# Patient Record
Sex: Female | Born: 1935 | Race: White | Hispanic: No | Marital: Married | Smoking: Never smoker
Health system: Southern US, Community
[De-identification: ages and names within clinical notes are randomized; demographics above are authoritative.]

## PROBLEM LIST (undated history)

## (undated) DIAGNOSIS — N8111 Cystocele, midline: Secondary | ICD-10-CM

## (undated) DIAGNOSIS — E876 Hypokalemia: Secondary | ICD-10-CM

## (undated) DIAGNOSIS — J189 Pneumonia, unspecified organism: Secondary | ICD-10-CM

## (undated) DIAGNOSIS — N3090 Cystitis, unspecified without hematuria: Secondary | ICD-10-CM

## (undated) DIAGNOSIS — R0602 Shortness of breath: Secondary | ICD-10-CM

## (undated) DIAGNOSIS — K219 Gastro-esophageal reflux disease without esophagitis: Secondary | ICD-10-CM

## (undated) DIAGNOSIS — K449 Diaphragmatic hernia without obstruction or gangrene: Secondary | ICD-10-CM

## (undated) DIAGNOSIS — IMO0002 Reserved for concepts with insufficient information to code with codable children: Secondary | ICD-10-CM

## (undated) DIAGNOSIS — I10 Essential (primary) hypertension: Secondary | ICD-10-CM

## (undated) DIAGNOSIS — M1990 Unspecified osteoarthritis, unspecified site: Secondary | ICD-10-CM

## (undated) DIAGNOSIS — C50919 Malignant neoplasm of unspecified site of unspecified female breast: Secondary | ICD-10-CM

## (undated) DIAGNOSIS — M76899 Other specified enthesopathies of unspecified lower limb, excluding foot: Secondary | ICD-10-CM

## (undated) DIAGNOSIS — G8918 Other acute postprocedural pain: Secondary | ICD-10-CM

## (undated) DIAGNOSIS — M545 Low back pain: Secondary | ICD-10-CM

## (undated) DIAGNOSIS — J329 Chronic sinusitis, unspecified: Secondary | ICD-10-CM

## (undated) DIAGNOSIS — E871 Hypo-osmolality and hyponatremia: Secondary | ICD-10-CM

## (undated) DIAGNOSIS — M549 Dorsalgia, unspecified: Secondary | ICD-10-CM

## (undated) DIAGNOSIS — L989 Disorder of the skin and subcutaneous tissue, unspecified: Secondary | ICD-10-CM

## (undated) DIAGNOSIS — M79603 Pain in arm, unspecified: Secondary | ICD-10-CM

## (undated) DIAGNOSIS — D649 Anemia, unspecified: Secondary | ICD-10-CM

## (undated) DIAGNOSIS — G20A1 Parkinson's disease without dyskinesia, without mention of fluctuations: Secondary | ICD-10-CM

## (undated) DIAGNOSIS — N3020 Other chronic cystitis without hematuria: Secondary | ICD-10-CM

## (undated) DIAGNOSIS — R079 Chest pain, unspecified: Secondary | ICD-10-CM

## (undated) DIAGNOSIS — M5137 Other intervertebral disc degeneration, lumbosacral region: Secondary | ICD-10-CM

## (undated) DIAGNOSIS — M542 Cervicalgia: Secondary | ICD-10-CM

## (undated) DIAGNOSIS — G20 Parkinson's disease: Secondary | ICD-10-CM

## (undated) HISTORY — DX: Low back pain: M54.5

## (undated) HISTORY — DX: Diaphragmatic hernia without obstruction or gangrene: K44.9

## (undated) HISTORY — DX: Parkinson's disease without dyskinesia, without mention of fluctuations: G20.A1

## (undated) HISTORY — DX: Essential (primary) hypertension: I10

## (undated) HISTORY — DX: Other acute postprocedural pain: G89.18

## (undated) HISTORY — DX: Parkinson's disease: G20

## (undated) HISTORY — DX: Unspecified osteoarthritis, unspecified site: M19.90

## (undated) HISTORY — DX: Chronic sinusitis, unspecified: J32.9

## (undated) HISTORY — DX: Hypokalemia: E87.6

## (undated) HISTORY — DX: Chest pain, unspecified: R07.9

## (undated) HISTORY — DX: Shortness of breath: R06.02

## (undated) HISTORY — DX: Anemia, unspecified: D64.9

## (undated) HISTORY — DX: Cystitis, unspecified without hematuria: N30.90

## (undated) HISTORY — DX: Reserved for concepts with insufficient information to code with codable children: IMO0002

## (undated) HISTORY — DX: Dorsalgia, unspecified: M54.9

## (undated) HISTORY — PX: BREAST IMPLANT REMOVAL: SUR1101

## (undated) HISTORY — DX: Other intervertebral disc degeneration, lumbosacral region: M51.37

## (undated) HISTORY — DX: Other chronic cystitis without hematuria: N30.20

## (undated) HISTORY — DX: Cystocele, midline: N81.11

## (undated) HISTORY — PX: ABDOMINAL HYSTERECTOMY: SHX81

## (undated) HISTORY — DX: Cervicalgia: M54.2

## (undated) HISTORY — DX: Hypo-osmolality and hyponatremia: E87.1

## (undated) HISTORY — DX: Pain in arm, unspecified: M79.603

## (undated) HISTORY — DX: Disorder of the skin and subcutaneous tissue, unspecified: L98.9

## (undated) HISTORY — DX: Malignant neoplasm of unspecified site of unspecified female breast: C50.919

## (undated) HISTORY — DX: Other specified enthesopathies of unspecified lower limb, excluding foot: M76.899

## (undated) HISTORY — PX: BLEPHAROPLASTY: SUR158

## (undated) HISTORY — DX: Pneumonia, unspecified organism: J18.9

## (undated) HISTORY — DX: Gastro-esophageal reflux disease without esophagitis: K21.9

---

## 1977-12-18 HISTORY — PX: TONSILLECTOMY AND ADENOIDECTOMY: SHX28

## 1981-12-18 HISTORY — PX: VESICOVAGINAL FISTULA CLOSURE W/ TAH: SUR271

## 1984-12-18 HISTORY — PX: BREAST SURGERY: SHX581

## 1985-12-18 HISTORY — PX: BREAST ENHANCEMENT SURGERY: SHX7

## 2004-10-20 ENCOUNTER — Ambulatory Visit: Payer: Self-pay | Admitting: Internal Medicine

## 2005-06-12 ENCOUNTER — Emergency Department: Payer: Self-pay | Admitting: Emergency Medicine

## 2005-06-12 ENCOUNTER — Other Ambulatory Visit: Payer: Self-pay

## 2005-08-25 ENCOUNTER — Ambulatory Visit: Payer: Self-pay | Admitting: Unknown Physician Specialty

## 2005-11-01 ENCOUNTER — Ambulatory Visit: Payer: Self-pay | Admitting: Internal Medicine

## 2006-10-24 ENCOUNTER — Ambulatory Visit: Payer: Self-pay | Admitting: Internal Medicine

## 2007-01-10 ENCOUNTER — Ambulatory Visit: Payer: Self-pay | Admitting: Urology

## 2007-06-17 ENCOUNTER — Ambulatory Visit: Payer: Self-pay | Admitting: Surgery

## 2007-07-16 ENCOUNTER — Ambulatory Visit: Payer: Self-pay | Admitting: Internal Medicine

## 2007-09-17 ENCOUNTER — Ambulatory Visit: Payer: Self-pay | Admitting: Unknown Physician Specialty

## 2007-11-11 ENCOUNTER — Ambulatory Visit: Payer: Self-pay | Admitting: Internal Medicine

## 2008-12-03 ENCOUNTER — Ambulatory Visit: Payer: Self-pay | Admitting: Internal Medicine

## 2009-03-31 ENCOUNTER — Ambulatory Visit: Payer: Self-pay | Admitting: Unknown Physician Specialty

## 2009-05-05 ENCOUNTER — Ambulatory Visit: Payer: Self-pay | Admitting: Pain Medicine

## 2009-05-11 ENCOUNTER — Ambulatory Visit: Payer: Self-pay | Admitting: Pain Medicine

## 2009-05-26 ENCOUNTER — Ambulatory Visit: Payer: Self-pay | Admitting: Physician Assistant

## 2009-07-19 ENCOUNTER — Ambulatory Visit: Payer: Self-pay | Admitting: Internal Medicine

## 2009-07-23 ENCOUNTER — Ambulatory Visit: Payer: Self-pay | Admitting: Internal Medicine

## 2009-10-05 ENCOUNTER — Ambulatory Visit: Payer: Self-pay | Admitting: Pain Medicine

## 2009-10-20 ENCOUNTER — Ambulatory Visit: Payer: Self-pay | Admitting: Physician Assistant

## 2009-10-27 ENCOUNTER — Ambulatory Visit: Payer: Self-pay | Admitting: Internal Medicine

## 2009-12-03 ENCOUNTER — Ambulatory Visit: Payer: Self-pay | Admitting: Urology

## 2010-04-13 ENCOUNTER — Encounter: Payer: Self-pay | Admitting: Cardiovascular Disease

## 2010-04-29 ENCOUNTER — Ambulatory Visit: Payer: Self-pay | Admitting: Cardiovascular Disease

## 2010-04-29 DIAGNOSIS — R0602 Shortness of breath: Secondary | ICD-10-CM

## 2010-04-29 DIAGNOSIS — R079 Chest pain, unspecified: Secondary | ICD-10-CM

## 2010-04-29 DIAGNOSIS — E7800 Pure hypercholesterolemia, unspecified: Secondary | ICD-10-CM

## 2010-04-29 DIAGNOSIS — I10 Essential (primary) hypertension: Secondary | ICD-10-CM | POA: Insufficient documentation

## 2010-04-29 HISTORY — DX: Chest pain, unspecified: R07.9

## 2010-04-29 HISTORY — DX: Shortness of breath: R06.02

## 2010-05-04 ENCOUNTER — Telehealth (INDEPENDENT_AMBULATORY_CARE_PROVIDER_SITE_OTHER): Payer: Self-pay | Admitting: *Deleted

## 2010-05-05 ENCOUNTER — Encounter (HOSPITAL_COMMUNITY): Admission: RE | Admit: 2010-05-05 | Discharge: 2010-07-19 | Payer: Self-pay | Admitting: Cardiovascular Disease

## 2010-05-05 ENCOUNTER — Ambulatory Visit: Payer: Self-pay | Admitting: Internal Medicine

## 2010-05-05 ENCOUNTER — Ambulatory Visit: Payer: Self-pay

## 2010-06-02 ENCOUNTER — Ambulatory Visit: Payer: Self-pay | Admitting: Obstetrics and Gynecology

## 2011-01-17 NOTE — Letter (Signed)
Summary: PHI  PHI   Imported By: Harlon Flor 05/02/2010 16:51:01  _____________________________________________________________________  External Attachment:    Type:   Image     Comment:   External Document

## 2011-01-17 NOTE — Progress Notes (Signed)
Summary: Nuclear Pre-Procedure  Phone Note Outgoing Call   Call placed by: Milana Na, EMT-P,  May 04, 2010 4:20 PM Summary of Call: Reviewed information on Myoview Information Sheet (see scanned document for further details).  Spoke with patient.     Nuclear Med Background Indications for Stress Test: Evaluation for Ischemia     Symptoms: Chest Pain with Exertion, DOE    Nuclear Pre-Procedure Cardiac Risk Factors: Hypertension, Lipids Height (in): 65  Nuclear Med Study Referring MD:  T.Gollan

## 2011-01-17 NOTE — Assessment & Plan Note (Signed)
Summary: Cardiology Nuclear Study  Nuclear Med Background Indications for Stress Test: Evaluation for Ischemia    History Comments: NO DOCUMENTED CAD  Symptoms: Chest Tightness, Chest Tightness with Exertion, DOE, Fatigue, Palpitations, Rapid HR  Symptoms Comments: CP>back. Last episode of GE:XBMW week   Nuclear Pre-Procedure Cardiac Risk Factors: Hypertension, Lipids, Obesity Caffeine/Decaff Intake: None NPO After: 7:00 PM Lungs: Clear IV 0.9% NS with Angio Cath: 22g     IV Site: (r) WRIST IV Started by: Stanton Kidney EMT-P Chest Size (in) 38     Cup Size d     Height (in): 64 Weight (lb): 188 BMI: 32.39 Tech Comments: Metoprolol Held > 24 hours, per patient.  Nuclear Med Study 1 or 2 day study:  1 day     Stress Test Type:  Stress Reading MD:  Arvilla Meres, MD     Referring MD:  Julien Nordmann, MD Resting Radionuclide:  Technetium 21m Tetrofosmin     Resting Radionuclide Dose:  11.0 mCi  Stress Radionuclide:  Technetium 87m Tetrofosmin     Stress Radionuclide Dose:  33.0 mCi   Stress Protocol Exercise Time (min):  5:00 min     Max HR:  151 bpm     Predicted Max HR:  148 bpm  Max Systolic BP: 228 mm Hg     Percent Max HR:  102.03 %     METS: 7.0 Rate Pressure Product:  41324    Stress Test Technologist:  Rea College CMA-N     Nuclear Technologist:  Domenic Polite CNMT  Rest Procedure  Myocardial perfusion imaging was performed at rest 45 minutes following the intravenous administration of Myoview Technetium 41m Tetrofosmin.  Stress Procedure  The patient exercised for five minutes.  The patient stopped due to fatigue and denied any chest pain.  There were no diagnostic ST-T wave changes, only occasional PAC's.  She did have a hypertensive response to exercise, 228/64, immediately post exercise.  Myoview was injected at peak exercise and myocardial perfusion imaging was performed after a brief delay.  QPS Raw Data Images:  Normal; no motion artifact; normal  heart/lung ratio. Stress Images:  There is normal uptake in all areas. Rest Images:  Normal homogeneous uptake in all areas of the myocardium. Subtraction (SDS):  Normal Transient Ischemic Dilatation:  .86  (Normal <1.22)  Lung/Heart Ratio:  .29  (Normal <0.45)  Quantitative Gated Spect Images QGS EDV:  54 ml QGS ESV:  11 ml QGS EF:  80 % QGS cine images:  Normal  Findings Normal nuclear study      Overall Impression  Exercise Capacity: Fair exercise capacity. BP Response: Hypertensive blood pressure response. Clinical Symptoms: + dyspnea. ECG Impression: Insignificant upsloping ST segment depression. Overall Impression: Normal stress nuclear study.    Appended Document: Cardiology Nuclear Study no ischemia. BP needs to be watched.  Need to walk more as exercise tolerance was low  Appended Document: Cardiology Nuclear Study Called spoke with pt, aware of results. Advised pt to monitor BP's and call if consistantly elevated and encouraged in walking for exercise.  EWJ

## 2011-01-17 NOTE — Assessment & Plan Note (Signed)
Summary: NP6/AMD   Primary Provider:  Dale [place], MD  CC:  Consult;DOE;Chest Pressure.  History of Present Illness: Ms. Grealish is a 76 year old woman with a history of renal insufficiency, patient of Dr. Achilles Dunk, who states that this week she has had significant shortness of breath, lightheadedness, chest pain, back pain particularly when she works in the garden. When she stops for 5 minutes she feels better and then she is able to start working again until she developed symptoms again. She has shortness of breath particularly with exertion which he states is new for her.  She has a history of breast cancer many years ago though had no chemotherapy or radiation.   Prior to these episodes, she had been feeling well, working in her garden without any problems. She has been taking aspirin on an occasional basis. She does take her cholesterol medication daily. She states that her blood pressures typically well-controlled.  Problems Prior to Update: 1)  Hypertension  (ICD-401.9) 2)  Chest Pain  (ICD-786.50) 3)  Dyspnea  (ICD-786.05)  Medications Prior to Update: 1)  Evista 60 Mg Tabs (Raloxifene Hcl) .... Take 1 Tablet By Mouth Once Daily 2)  Fluoxetine Hcl 10 Mg Caps (Fluoxetine Hcl) .... Take 1 Tablet By Mouth Once Daily 3)  Lisinopril 40 Mg Tabs (Lisinopril) .... Take 1 Tablet By Mouth Once Daily 4)  Hydrochlorothiazide 25 Mg Tabs (Hydrochlorothiazide) .... Take 1 Tablet By Mouth Once Daily 5)  Celebrex 200 Mg Caps (Celecoxib) .... Take 1 Tablet By Mouth Once Daily 6)  Simvastatin 10 Mg Tabs (Simvastatin) .... Take 1 Tablet By Mouth Once Daily 7)  Metoprolol Succinate 25 Mg Xr24h-Tab (Metoprolol Succinate) .... Take 1 Tablet By Mouth  Two Times A Day 8)  Aspirin 81 Mg Tbec (Aspirin) .... Take One Tablet By Mouth Daily  Current Medications (verified): 1)  Evista 60 Mg Tabs (Raloxifene Hcl) .... Take 1 Tablet By Mouth Once Daily 2)  Fluoxetine Hcl 10 Mg Caps (Fluoxetine Hcl) .... Take 1  Tablet By Mouth Once Daily 3)  Lisinopril 40 Mg Tabs (Lisinopril) .... Take 1 Tablet By Mouth Once Daily 4)  Hydrochlorothiazide 25 Mg Tabs (Hydrochlorothiazide) .... Take 1 Tablet By Mouth Once Daily 5)  Meloxicam 7.5 Mg Tabs (Meloxicam) .... Take 1 By Mouth Once Daily 6)  Simvastatin 10 Mg Tabs (Simvastatin) .... Take 1 Tablet By Mouth Once Daily 7)  Metoprolol Succinate 25 Mg Xr24h-Tab (Metoprolol Succinate) .... Take 1 Tablet By Mouth  Two Times A Day 8)  Aspirin 81 Mg Tbec (Aspirin) .... Take One Tablet By Mouth As Needed 9)  Omeprazole 20 Mg Cpdr (Omeprazole) .... Take 1 By Mouth Once Daily 10)  Trazodone Hcl 50 Mg Tabs (Trazodone Hcl) .... Take Half By Mouth At Bedtime 11)  Hydrocodone-Acetaminophen 5-325 Mg Tabs (Hydrocodone-Acetaminophen) .... Take 1/2 To 1 By Mouth Once Daily 12)  Calcium 600 1500 Mg Tabs (Calcium Carbonate) .... Take 1 By Mouth Once Daily 13)  Glucosamine-Chondroitin  Caps (Glucosamine-Chondroit-Vit C-Mn) .... Take 2 By Mouth Once Daily 14)  Daily Multi  Tabs (Multiple Vitamins-Minerals) .... Take 1 By Mouth Once Daily  Allergies (verified): No Known Drug Allergies  Past History:  Past Medical History: Last updated: 04/13/2010 Breast Cancer-Mastectomy-left in 1986 Gastroesophageal Reflux Hiatal Hernia Hypertension Hysterectomy Chronic Cystitis Mixed Urinary Incontinence  Family History: Last updated: 04/13/2010 Family History of Depression:  Parkinson's Disease  Social History: Last updated: 04/13/2010 Tobacco Use - No.  Alcohol Use - no Drug Use - no  Risk Factors:  Alcohol Use: 0 (04/13/2010) Caffeine Use: Yes (04/13/2010)  Risk Factors: Smoking Status: never (04/13/2010)  Review of Systems  The patient denies fever, weight loss, weight gain, vision loss, decreased hearing, hoarseness, chest pain, syncope, dyspnea on exertion, peripheral edema, prolonged cough, abdominal pain, incontinence, muscle weakness, depression, and enlarged lymph  nodes.    Vital Signs:  Patient profile:   76 year old female Height:      65 inches Weight:      186 pounds BMI:     31.06 Pulse rate:   68 / minute BP sitting:   130 / 88  (left arm) Cuff size:   regular  Vitals Entered By: Stanton Kidney, EMT-P (Apr 29, 2010 2:23 PM)  Physical Exam  General:  elderly woman in no apparent distress, HENT exam is benign, or branch is clear, neck is supple with no JVP or carotid bruits, heart sounds are regular with S1-S2 and no murmurs appreciated, lungs are clear to auscultation with no wheezes or rales, abdominal exam is benign, no significant lower extremity edema, neurologic exam is grossly nonfocal, skin is warm and dry. Pulses are equal and symmetrical in her upper and lower extremities   New Orders:     1)  Nuclear Stress Test (Nuc Stress Test)  Due: 04/29/2010   EKG  Procedure date:  04/29/2010  Findings:      EKG shows normal sinus rhythm with rate 68 beats per minute, no significant ST or T wave changes.  Impression & Recommendations:  Problem # 1:  DYSPNEA (ICD-786.05) etiology of her shortness of breath and chest pain associated with exertion is concerning for ischemia. She is uncertain of her family history. She does have hyperlipidemia, hypertension. No smoking history. Because she has several risk factors, we ordered a stress test for her. We've asked her to take her aspirin on a consistent basis daily.  Her updated medication list for this problem includes:    Lisinopril 40 Mg Tabs (Lisinopril) .Marland Kitchen... Take 1 tablet by mouth once daily    Hydrochlorothiazide 25 Mg Tabs (Hydrochlorothiazide) .Marland Kitchen... Take 1 tablet by mouth once daily    Metoprolol Succinate 25 Mg Xr24h-tab (Metoprolol succinate) .Marland Kitchen... Take 1 tablet by mouth  two times a day    Aspirin 81 Mg Tbec (Aspirin) .Marland Kitchen... Take one tablet by mouth as needed  Orders: Nuclear Stress Test (Nuc Stress Test)  Problem # 2:  HYPERTENSION (ICD-401.9) Blood pressure was reasonably  well controlled on her visit today we've made no changes.  Her updated medication list for this problem includes:    Lisinopril 40 Mg Tabs (Lisinopril) .Marland Kitchen... Take 1 tablet by mouth once daily    Hydrochlorothiazide 25 Mg Tabs (Hydrochlorothiazide) .Marland Kitchen... Take 1 tablet by mouth once daily    Metoprolol Succinate 25 Mg Xr24h-tab (Metoprolol succinate) .Marland Kitchen... Take 1 tablet by mouth  two times a day    Aspirin 81 Mg Tbec (Aspirin) .Marland Kitchen... Take one tablet by mouth as needed  Orders: Nuclear Stress Test (Nuc Stress Test)  Problem # 3:  HYPERLIPIDEMIA-MIXED (ICD-272.4) We'll try to obtain her most recent lipid panel from Dr. Dale [place].  Her updated medication list for this problem includes:    Simvastatin 10 Mg Tabs (Simvastatin) .Marland Kitchen... Take 1 tablet by mouth once daily  Patient Instructions: 1)  Your physician recommends that you schedule a follow-up appointment as needed  2)  Your physician has requested that you have an exercise stress myoview.  For further information please visit [email or web address]  Please  follow instruction sheet, as given.

## 2011-06-05 ENCOUNTER — Ambulatory Visit: Payer: Self-pay | Admitting: Internal Medicine

## 2011-07-11 ENCOUNTER — Encounter: Payer: Self-pay | Admitting: Cardiovascular Disease

## 2011-10-19 ENCOUNTER — Ambulatory Visit: Payer: Self-pay | Admitting: Internal Medicine

## 2011-12-19 HISTORY — PX: BREAST BIOPSY: SHX20

## 2012-01-21 DIAGNOSIS — N760 Acute vaginitis: Secondary | ICD-10-CM | POA: Diagnosis not present

## 2012-02-15 DIAGNOSIS — IMO0002 Reserved for concepts with insufficient information to code with codable children: Secondary | ICD-10-CM | POA: Diagnosis not present

## 2012-02-15 DIAGNOSIS — M5430 Sciatica, unspecified side: Secondary | ICD-10-CM | POA: Diagnosis not present

## 2012-02-20 ENCOUNTER — Ambulatory Visit: Payer: Self-pay | Admitting: Unknown Physician Specialty

## 2012-02-20 DIAGNOSIS — I10 Essential (primary) hypertension: Secondary | ICD-10-CM | POA: Diagnosis not present

## 2012-02-20 DIAGNOSIS — Z9079 Acquired absence of other genital organ(s): Secondary | ICD-10-CM | POA: Diagnosis not present

## 2012-02-20 DIAGNOSIS — R1031 Right lower quadrant pain: Secondary | ICD-10-CM | POA: Diagnosis not present

## 2012-02-20 DIAGNOSIS — Z8371 Family history of colonic polyps: Secondary | ICD-10-CM | POA: Diagnosis not present

## 2012-02-20 DIAGNOSIS — Z9010 Acquired absence of unspecified breast and nipple: Secondary | ICD-10-CM | POA: Diagnosis not present

## 2012-02-20 DIAGNOSIS — K5730 Diverticulosis of large intestine without perforation or abscess without bleeding: Secondary | ICD-10-CM | POA: Diagnosis not present

## 2012-02-20 DIAGNOSIS — Z79899 Other long term (current) drug therapy: Secondary | ICD-10-CM | POA: Diagnosis not present

## 2012-02-20 DIAGNOSIS — M5137 Other intervertebral disc degeneration, lumbosacral region: Secondary | ICD-10-CM | POA: Diagnosis not present

## 2012-02-20 DIAGNOSIS — E785 Hyperlipidemia, unspecified: Secondary | ICD-10-CM | POA: Diagnosis not present

## 2012-02-20 DIAGNOSIS — Z9889 Other specified postprocedural states: Secondary | ICD-10-CM | POA: Diagnosis not present

## 2012-02-20 DIAGNOSIS — R1084 Generalized abdominal pain: Secondary | ICD-10-CM | POA: Diagnosis not present

## 2012-02-20 DIAGNOSIS — K648 Other hemorrhoids: Secondary | ICD-10-CM | POA: Diagnosis not present

## 2012-02-20 DIAGNOSIS — IMO0002 Reserved for concepts with insufficient information to code with codable children: Secondary | ICD-10-CM | POA: Diagnosis not present

## 2012-02-20 DIAGNOSIS — K649 Unspecified hemorrhoids: Secondary | ICD-10-CM | POA: Diagnosis not present

## 2012-02-20 LAB — BASIC METABOLIC PANEL
Anion Gap: 9 (ref 7–16)
Calcium, Total: 9.2 mg/dL (ref 8.5–10.1)
Co2: 29 mmol/L (ref 21–32)
Creatinine: 0.76 mg/dL (ref 0.60–1.30)
EGFR (African American): >60
EGFR (Non-African Amer.): >60
Osmolality: 272 (ref 275–301)
Potassium: 3.9 mmol/L (ref 3.5–5.1)
Sodium: 137 mmol/L (ref 136–145)

## 2012-02-23 ENCOUNTER — Ambulatory Visit: Payer: Self-pay | Admitting: Unknown Physician Specialty

## 2012-02-23 DIAGNOSIS — K6389 Other specified diseases of intestine: Secondary | ICD-10-CM | POA: Diagnosis not present

## 2012-02-23 DIAGNOSIS — R1011 Right upper quadrant pain: Secondary | ICD-10-CM | POA: Diagnosis not present

## 2012-02-23 DIAGNOSIS — K5730 Diverticulosis of large intestine without perforation or abscess without bleeding: Secondary | ICD-10-CM | POA: Diagnosis not present

## 2012-02-27 DIAGNOSIS — M5137 Other intervertebral disc degeneration, lumbosacral region: Secondary | ICD-10-CM | POA: Diagnosis not present

## 2012-02-27 DIAGNOSIS — IMO0002 Reserved for concepts with insufficient information to code with codable children: Secondary | ICD-10-CM | POA: Diagnosis not present

## 2012-03-01 DIAGNOSIS — R1084 Generalized abdominal pain: Secondary | ICD-10-CM | POA: Diagnosis not present

## 2012-03-01 DIAGNOSIS — E7800 Pure hypercholesterolemia, unspecified: Secondary | ICD-10-CM | POA: Diagnosis not present

## 2012-03-01 DIAGNOSIS — N760 Acute vaginitis: Secondary | ICD-10-CM | POA: Diagnosis not present

## 2012-03-01 DIAGNOSIS — I10 Essential (primary) hypertension: Secondary | ICD-10-CM | POA: Diagnosis not present

## 2012-03-01 DIAGNOSIS — N390 Urinary tract infection, site not specified: Secondary | ICD-10-CM | POA: Diagnosis not present

## 2012-03-13 DIAGNOSIS — M4800 Spinal stenosis, site unspecified: Secondary | ICD-10-CM | POA: Diagnosis not present

## 2012-03-13 DIAGNOSIS — M549 Dorsalgia, unspecified: Secondary | ICD-10-CM | POA: Diagnosis not present

## 2012-03-13 DIAGNOSIS — IMO0002 Reserved for concepts with insufficient information to code with codable children: Secondary | ICD-10-CM | POA: Diagnosis not present

## 2012-03-20 ENCOUNTER — Ambulatory Visit: Payer: Self-pay | Admitting: Unknown Physician Specialty

## 2012-03-20 DIAGNOSIS — M5137 Other intervertebral disc degeneration, lumbosacral region: Secondary | ICD-10-CM | POA: Diagnosis not present

## 2012-03-20 DIAGNOSIS — M5126 Other intervertebral disc displacement, lumbar region: Secondary | ICD-10-CM | POA: Diagnosis not present

## 2012-03-20 DIAGNOSIS — M47817 Spondylosis without myelopathy or radiculopathy, lumbosacral region: Secondary | ICD-10-CM | POA: Diagnosis not present

## 2012-04-29 DIAGNOSIS — L821 Other seborrheic keratosis: Secondary | ICD-10-CM | POA: Diagnosis not present

## 2012-04-29 DIAGNOSIS — D2370 Other benign neoplasm of skin of unspecified lower limb, including hip: Secondary | ICD-10-CM | POA: Diagnosis not present

## 2012-04-29 DIAGNOSIS — L819 Disorder of pigmentation, unspecified: Secondary | ICD-10-CM | POA: Diagnosis not present

## 2012-04-29 DIAGNOSIS — Z0189 Encounter for other specified special examinations: Secondary | ICD-10-CM | POA: Diagnosis not present

## 2012-05-21 ENCOUNTER — Ambulatory Visit: Payer: Self-pay | Admitting: Pain Medicine

## 2012-05-21 DIAGNOSIS — Z79899 Other long term (current) drug therapy: Secondary | ICD-10-CM | POA: Diagnosis not present

## 2012-05-21 DIAGNOSIS — G8929 Other chronic pain: Secondary | ICD-10-CM | POA: Diagnosis not present

## 2012-05-21 DIAGNOSIS — Z853 Personal history of malignant neoplasm of breast: Secondary | ICD-10-CM | POA: Diagnosis not present

## 2012-05-21 DIAGNOSIS — K449 Diaphragmatic hernia without obstruction or gangrene: Secondary | ICD-10-CM | POA: Diagnosis not present

## 2012-05-21 DIAGNOSIS — M1990 Unspecified osteoarthritis, unspecified site: Secondary | ICD-10-CM | POA: Diagnosis not present

## 2012-05-21 DIAGNOSIS — M461 Sacroiliitis, not elsewhere classified: Secondary | ICD-10-CM | POA: Diagnosis not present

## 2012-05-21 DIAGNOSIS — M533 Sacrococcygeal disorders, not elsewhere classified: Secondary | ICD-10-CM | POA: Diagnosis not present

## 2012-05-21 DIAGNOSIS — F329 Major depressive disorder, single episode, unspecified: Secondary | ICD-10-CM | POA: Diagnosis not present

## 2012-05-21 DIAGNOSIS — M4716 Other spondylosis with myelopathy, lumbar region: Secondary | ICD-10-CM | POA: Diagnosis not present

## 2012-05-21 DIAGNOSIS — K219 Gastro-esophageal reflux disease without esophagitis: Secondary | ICD-10-CM | POA: Diagnosis not present

## 2012-05-21 DIAGNOSIS — M545 Low back pain, unspecified: Secondary | ICD-10-CM | POA: Diagnosis not present

## 2012-05-21 DIAGNOSIS — I10 Essential (primary) hypertension: Secondary | ICD-10-CM | POA: Diagnosis not present

## 2012-05-23 ENCOUNTER — Ambulatory Visit: Payer: Self-pay | Admitting: Pain Medicine

## 2012-05-23 DIAGNOSIS — IMO0002 Reserved for concepts with insufficient information to code with codable children: Secondary | ICD-10-CM | POA: Diagnosis not present

## 2012-06-11 DIAGNOSIS — Z79899 Other long term (current) drug therapy: Secondary | ICD-10-CM | POA: Diagnosis not present

## 2012-06-11 DIAGNOSIS — I10 Essential (primary) hypertension: Secondary | ICD-10-CM | POA: Diagnosis not present

## 2012-06-11 DIAGNOSIS — E7800 Pure hypercholesterolemia, unspecified: Secondary | ICD-10-CM | POA: Diagnosis not present

## 2012-06-14 DIAGNOSIS — E7800 Pure hypercholesterolemia, unspecified: Secondary | ICD-10-CM | POA: Diagnosis not present

## 2012-06-14 DIAGNOSIS — I10 Essential (primary) hypertension: Secondary | ICD-10-CM | POA: Diagnosis not present

## 2012-06-14 DIAGNOSIS — N760 Acute vaginitis: Secondary | ICD-10-CM | POA: Diagnosis not present

## 2012-06-14 DIAGNOSIS — R1084 Generalized abdominal pain: Secondary | ICD-10-CM | POA: Diagnosis not present

## 2012-06-18 ENCOUNTER — Ambulatory Visit: Payer: Self-pay | Admitting: Pain Medicine

## 2012-06-18 DIAGNOSIS — F329 Major depressive disorder, single episode, unspecified: Secondary | ICD-10-CM | POA: Diagnosis not present

## 2012-06-18 DIAGNOSIS — I10 Essential (primary) hypertension: Secondary | ICD-10-CM | POA: Diagnosis not present

## 2012-06-18 DIAGNOSIS — M545 Low back pain, unspecified: Secondary | ICD-10-CM | POA: Diagnosis not present

## 2012-06-18 DIAGNOSIS — M79609 Pain in unspecified limb: Secondary | ICD-10-CM | POA: Diagnosis not present

## 2012-06-18 DIAGNOSIS — M47817 Spondylosis without myelopathy or radiculopathy, lumbosacral region: Secondary | ICD-10-CM | POA: Diagnosis not present

## 2012-06-18 DIAGNOSIS — M5137 Other intervertebral disc degeneration, lumbosacral region: Secondary | ICD-10-CM | POA: Diagnosis not present

## 2012-06-18 DIAGNOSIS — K219 Gastro-esophageal reflux disease without esophagitis: Secondary | ICD-10-CM | POA: Diagnosis not present

## 2012-06-18 DIAGNOSIS — G8929 Other chronic pain: Secondary | ICD-10-CM | POA: Diagnosis not present

## 2012-06-18 DIAGNOSIS — IMO0002 Reserved for concepts with insufficient information to code with codable children: Secondary | ICD-10-CM | POA: Diagnosis not present

## 2012-06-18 DIAGNOSIS — Z79899 Other long term (current) drug therapy: Secondary | ICD-10-CM | POA: Diagnosis not present

## 2012-06-18 DIAGNOSIS — Z853 Personal history of malignant neoplasm of breast: Secondary | ICD-10-CM | POA: Diagnosis not present

## 2012-06-18 DIAGNOSIS — M25559 Pain in unspecified hip: Secondary | ICD-10-CM | POA: Diagnosis not present

## 2012-06-27 ENCOUNTER — Ambulatory Visit: Payer: Self-pay | Admitting: Pain Medicine

## 2012-06-27 DIAGNOSIS — IMO0002 Reserved for concepts with insufficient information to code with codable children: Secondary | ICD-10-CM | POA: Diagnosis not present

## 2012-07-09 ENCOUNTER — Ambulatory Visit: Payer: Self-pay | Admitting: Internal Medicine

## 2012-07-09 DIAGNOSIS — N6489 Other specified disorders of breast: Secondary | ICD-10-CM | POA: Diagnosis not present

## 2012-07-09 DIAGNOSIS — R928 Other abnormal and inconclusive findings on diagnostic imaging of breast: Secondary | ICD-10-CM | POA: Diagnosis not present

## 2012-07-09 DIAGNOSIS — N6459 Other signs and symptoms in breast: Secondary | ICD-10-CM | POA: Diagnosis not present

## 2012-07-09 DIAGNOSIS — Z853 Personal history of malignant neoplasm of breast: Secondary | ICD-10-CM | POA: Diagnosis not present

## 2012-07-11 ENCOUNTER — Ambulatory Visit: Payer: Self-pay | Admitting: Internal Medicine

## 2012-07-11 DIAGNOSIS — N63 Unspecified lump in unspecified breast: Secondary | ICD-10-CM | POA: Diagnosis not present

## 2012-07-11 DIAGNOSIS — R928 Other abnormal and inconclusive findings on diagnostic imaging of breast: Secondary | ICD-10-CM | POA: Diagnosis not present

## 2012-07-22 DIAGNOSIS — N63 Unspecified lump in unspecified breast: Secondary | ICD-10-CM | POA: Diagnosis not present

## 2012-07-24 ENCOUNTER — Ambulatory Visit: Payer: Self-pay | Admitting: Pain Medicine

## 2012-07-24 DIAGNOSIS — M533 Sacrococcygeal disorders, not elsewhere classified: Secondary | ICD-10-CM | POA: Diagnosis not present

## 2012-07-24 DIAGNOSIS — M545 Low back pain, unspecified: Secondary | ICD-10-CM | POA: Diagnosis not present

## 2012-07-24 DIAGNOSIS — G8929 Other chronic pain: Secondary | ICD-10-CM | POA: Diagnosis not present

## 2012-07-24 DIAGNOSIS — M461 Sacroiliitis, not elsewhere classified: Secondary | ICD-10-CM | POA: Diagnosis not present

## 2012-07-24 DIAGNOSIS — F329 Major depressive disorder, single episode, unspecified: Secondary | ICD-10-CM | POA: Diagnosis not present

## 2012-07-24 DIAGNOSIS — M25559 Pain in unspecified hip: Secondary | ICD-10-CM | POA: Diagnosis not present

## 2012-07-24 DIAGNOSIS — M47817 Spondylosis without myelopathy or radiculopathy, lumbosacral region: Secondary | ICD-10-CM | POA: Diagnosis not present

## 2012-07-24 DIAGNOSIS — I10 Essential (primary) hypertension: Secondary | ICD-10-CM | POA: Diagnosis not present

## 2012-07-24 DIAGNOSIS — Z79899 Other long term (current) drug therapy: Secondary | ICD-10-CM | POA: Diagnosis not present

## 2012-07-24 DIAGNOSIS — K449 Diaphragmatic hernia without obstruction or gangrene: Secondary | ICD-10-CM | POA: Diagnosis not present

## 2012-07-24 DIAGNOSIS — IMO0002 Reserved for concepts with insufficient information to code with codable children: Secondary | ICD-10-CM | POA: Diagnosis not present

## 2012-07-24 DIAGNOSIS — M79609 Pain in unspecified limb: Secondary | ICD-10-CM | POA: Diagnosis not present

## 2012-07-24 DIAGNOSIS — K219 Gastro-esophageal reflux disease without esophagitis: Secondary | ICD-10-CM | POA: Diagnosis not present

## 2012-07-29 ENCOUNTER — Ambulatory Visit: Payer: Self-pay | Admitting: Pain Medicine

## 2012-07-29 DIAGNOSIS — M545 Low back pain, unspecified: Secondary | ICD-10-CM | POA: Diagnosis not present

## 2012-07-29 DIAGNOSIS — Z853 Personal history of malignant neoplasm of breast: Secondary | ICD-10-CM | POA: Diagnosis not present

## 2012-07-29 DIAGNOSIS — C50919 Malignant neoplasm of unspecified site of unspecified female breast: Secondary | ICD-10-CM | POA: Diagnosis not present

## 2012-08-01 DIAGNOSIS — R3989 Other symptoms and signs involving the genitourinary system: Secondary | ICD-10-CM | POA: Diagnosis not present

## 2012-08-01 DIAGNOSIS — N3020 Other chronic cystitis without hematuria: Secondary | ICD-10-CM | POA: Diagnosis not present

## 2012-08-01 DIAGNOSIS — N816 Rectocele: Secondary | ICD-10-CM | POA: Diagnosis not present

## 2012-08-01 DIAGNOSIS — N3946 Mixed incontinence: Secondary | ICD-10-CM | POA: Diagnosis not present

## 2012-08-05 ENCOUNTER — Ambulatory Visit: Payer: Self-pay | Admitting: Surgery

## 2012-08-05 DIAGNOSIS — N63 Unspecified lump in unspecified breast: Secondary | ICD-10-CM | POA: Diagnosis not present

## 2012-08-05 DIAGNOSIS — C50919 Malignant neoplasm of unspecified site of unspecified female breast: Secondary | ICD-10-CM | POA: Diagnosis not present

## 2012-08-07 ENCOUNTER — Ambulatory Visit: Payer: Self-pay | Admitting: Pain Medicine

## 2012-08-07 DIAGNOSIS — M545 Low back pain, unspecified: Secondary | ICD-10-CM | POA: Diagnosis not present

## 2012-08-07 DIAGNOSIS — Z79899 Other long term (current) drug therapy: Secondary | ICD-10-CM | POA: Diagnosis not present

## 2012-08-07 DIAGNOSIS — M4716 Other spondylosis with myelopathy, lumbar region: Secondary | ICD-10-CM | POA: Diagnosis not present

## 2012-08-07 DIAGNOSIS — E669 Obesity, unspecified: Secondary | ICD-10-CM | POA: Diagnosis not present

## 2012-08-07 DIAGNOSIS — M79609 Pain in unspecified limb: Secondary | ICD-10-CM | POA: Diagnosis not present

## 2012-08-07 DIAGNOSIS — IMO0002 Reserved for concepts with insufficient information to code with codable children: Secondary | ICD-10-CM | POA: Diagnosis not present

## 2012-08-07 DIAGNOSIS — G8929 Other chronic pain: Secondary | ICD-10-CM | POA: Diagnosis not present

## 2012-08-07 DIAGNOSIS — M461 Sacroiliitis, not elsewhere classified: Secondary | ICD-10-CM | POA: Diagnosis not present

## 2012-08-07 DIAGNOSIS — M533 Sacrococcygeal disorders, not elsewhere classified: Secondary | ICD-10-CM | POA: Diagnosis not present

## 2012-08-07 DIAGNOSIS — M47817 Spondylosis without myelopathy or radiculopathy, lumbosacral region: Secondary | ICD-10-CM | POA: Diagnosis not present

## 2012-08-07 DIAGNOSIS — M25559 Pain in unspecified hip: Secondary | ICD-10-CM | POA: Diagnosis not present

## 2012-08-08 DIAGNOSIS — C50419 Malignant neoplasm of upper-outer quadrant of unspecified female breast: Secondary | ICD-10-CM | POA: Diagnosis not present

## 2012-08-09 ENCOUNTER — Ambulatory Visit: Payer: Self-pay | Admitting: Oncology

## 2012-08-09 DIAGNOSIS — Z9010 Acquired absence of unspecified breast and nipple: Secondary | ICD-10-CM | POA: Diagnosis not present

## 2012-08-09 DIAGNOSIS — E785 Hyperlipidemia, unspecified: Secondary | ICD-10-CM | POA: Diagnosis not present

## 2012-08-09 DIAGNOSIS — Z853 Personal history of malignant neoplasm of breast: Secondary | ICD-10-CM | POA: Diagnosis not present

## 2012-08-09 DIAGNOSIS — Z79899 Other long term (current) drug therapy: Secondary | ICD-10-CM | POA: Diagnosis not present

## 2012-08-09 DIAGNOSIS — I059 Rheumatic mitral valve disease, unspecified: Secondary | ICD-10-CM | POA: Diagnosis not present

## 2012-08-09 DIAGNOSIS — I10 Essential (primary) hypertension: Secondary | ICD-10-CM | POA: Diagnosis not present

## 2012-08-09 DIAGNOSIS — Z90710 Acquired absence of both cervix and uterus: Secondary | ICD-10-CM | POA: Diagnosis not present

## 2012-08-09 DIAGNOSIS — C50919 Malignant neoplasm of unspecified site of unspecified female breast: Secondary | ICD-10-CM | POA: Diagnosis not present

## 2012-08-18 ENCOUNTER — Ambulatory Visit: Payer: Self-pay | Admitting: Oncology

## 2012-08-18 HISTORY — PX: MASTECTOMY: SHX3

## 2012-08-22 ENCOUNTER — Ambulatory Visit: Payer: Self-pay | Admitting: Surgery

## 2012-08-22 DIAGNOSIS — Z01810 Encounter for preprocedural cardiovascular examination: Secondary | ICD-10-CM | POA: Diagnosis not present

## 2012-08-22 DIAGNOSIS — C50919 Malignant neoplasm of unspecified site of unspecified female breast: Secondary | ICD-10-CM | POA: Diagnosis not present

## 2012-08-22 DIAGNOSIS — I10 Essential (primary) hypertension: Secondary | ICD-10-CM | POA: Diagnosis not present

## 2012-08-22 DIAGNOSIS — Z01812 Encounter for preprocedural laboratory examination: Secondary | ICD-10-CM | POA: Diagnosis not present

## 2012-08-22 LAB — BASIC METABOLIC PANEL
Anion Gap: 7 (ref 7–16)
BUN: 15 mg/dL (ref 7–18)
Creatinine: 0.84 mg/dL (ref 0.60–1.30)
EGFR (African American): >60
Potassium: 3.7 mmol/L (ref 3.5–5.1)
Sodium: 136 mmol/L (ref 136–145)

## 2012-08-22 LAB — CBC WITH DIFFERENTIAL/PLATELET
Eosinophil %: 4.1 %
HCT: 34.9 % — ABNORMAL LOW (ref 35.0–47.0)
HGB: 12 g/dL (ref 12.0–16.0)
Lymphocyte #: 1.8 10*3/uL (ref 1.0–3.6)
MCV: 90 fL (ref 80–100)
Monocyte %: 7.1 %
Neutrophil #: 5.1 10*3/uL (ref 1.4–6.5)
Platelet: 235 10*3/uL (ref 150–440)
RBC: 3.89 10*6/uL (ref 3.80–5.20)
RDW: 13.3 % (ref 11.5–14.5)
WBC: 7.9 10*3/uL (ref 3.6–11.0)

## 2012-08-28 ENCOUNTER — Ambulatory Visit: Payer: Self-pay | Admitting: Surgery

## 2012-08-28 DIAGNOSIS — Z23 Encounter for immunization: Secondary | ICD-10-CM | POA: Diagnosis not present

## 2012-08-28 DIAGNOSIS — E785 Hyperlipidemia, unspecified: Secondary | ICD-10-CM | POA: Diagnosis not present

## 2012-08-28 DIAGNOSIS — C50419 Malignant neoplasm of upper-outer quadrant of unspecified female breast: Secondary | ICD-10-CM | POA: Diagnosis not present

## 2012-08-28 DIAGNOSIS — N63 Unspecified lump in unspecified breast: Secondary | ICD-10-CM | POA: Diagnosis not present

## 2012-08-28 DIAGNOSIS — Z9010 Acquired absence of unspecified breast and nipple: Secondary | ICD-10-CM | POA: Diagnosis not present

## 2012-08-28 DIAGNOSIS — Z7902 Long term (current) use of antithrombotics/antiplatelets: Secondary | ICD-10-CM | POA: Diagnosis not present

## 2012-08-28 DIAGNOSIS — Z7982 Long term (current) use of aspirin: Secondary | ICD-10-CM | POA: Diagnosis not present

## 2012-08-28 DIAGNOSIS — Z170 Estrogen receptor positive status [ER+]: Secondary | ICD-10-CM | POA: Diagnosis not present

## 2012-08-28 DIAGNOSIS — C50919 Malignant neoplasm of unspecified site of unspecified female breast: Secondary | ICD-10-CM | POA: Diagnosis not present

## 2012-08-28 DIAGNOSIS — C773 Secondary and unspecified malignant neoplasm of axilla and upper limb lymph nodes: Secondary | ICD-10-CM | POA: Diagnosis not present

## 2012-08-28 DIAGNOSIS — Z90710 Acquired absence of both cervix and uterus: Secondary | ICD-10-CM | POA: Diagnosis not present

## 2012-08-28 DIAGNOSIS — Z853 Personal history of malignant neoplasm of breast: Secondary | ICD-10-CM | POA: Diagnosis not present

## 2012-08-28 DIAGNOSIS — C50219 Malignant neoplasm of upper-inner quadrant of unspecified female breast: Secondary | ICD-10-CM | POA: Diagnosis not present

## 2012-08-28 DIAGNOSIS — Z79899 Other long term (current) drug therapy: Secondary | ICD-10-CM | POA: Diagnosis not present

## 2012-08-29 DIAGNOSIS — Z170 Estrogen receptor positive status [ER+]: Secondary | ICD-10-CM | POA: Diagnosis not present

## 2012-08-29 DIAGNOSIS — C50219 Malignant neoplasm of upper-inner quadrant of unspecified female breast: Secondary | ICD-10-CM | POA: Diagnosis not present

## 2012-08-29 DIAGNOSIS — E785 Hyperlipidemia, unspecified: Secondary | ICD-10-CM | POA: Diagnosis not present

## 2012-08-29 DIAGNOSIS — Z9010 Acquired absence of unspecified breast and nipple: Secondary | ICD-10-CM | POA: Diagnosis not present

## 2012-08-29 DIAGNOSIS — C773 Secondary and unspecified malignant neoplasm of axilla and upper limb lymph nodes: Secondary | ICD-10-CM | POA: Diagnosis not present

## 2012-08-29 DIAGNOSIS — Z90710 Acquired absence of both cervix and uterus: Secondary | ICD-10-CM | POA: Diagnosis not present

## 2012-08-29 HISTORY — PX: BREAST IMPLANT REMOVAL: SUR1101

## 2012-08-29 LAB — URINALYSIS, COMPLETE
Blood: NEGATIVE
Glucose,UR: NEGATIVE mg/dL (ref 0–75)
Ketone: NEGATIVE
Protein: NEGATIVE
RBC,UR: NONE SEEN /HPF (ref 0–5)
Specific Gravity: 1.004 (ref 1.003–1.030)
WBC UR: NONE SEEN /HPF (ref 0–5)

## 2012-08-29 LAB — PATHOLOGY REPORT

## 2012-08-30 LAB — URINE CULTURE

## 2012-09-12 ENCOUNTER — Ambulatory Visit: Payer: Self-pay | Admitting: Oncology

## 2012-09-12 DIAGNOSIS — Z9010 Acquired absence of unspecified breast and nipple: Secondary | ICD-10-CM | POA: Diagnosis not present

## 2012-09-12 DIAGNOSIS — Z79899 Other long term (current) drug therapy: Secondary | ICD-10-CM | POA: Diagnosis not present

## 2012-09-12 DIAGNOSIS — Z7982 Long term (current) use of aspirin: Secondary | ICD-10-CM | POA: Diagnosis not present

## 2012-09-12 DIAGNOSIS — C773 Secondary and unspecified malignant neoplasm of axilla and upper limb lymph nodes: Secondary | ICD-10-CM | POA: Diagnosis not present

## 2012-09-12 DIAGNOSIS — C50919 Malignant neoplasm of unspecified site of unspecified female breast: Secondary | ICD-10-CM | POA: Diagnosis not present

## 2012-09-12 DIAGNOSIS — E785 Hyperlipidemia, unspecified: Secondary | ICD-10-CM | POA: Diagnosis not present

## 2012-09-12 DIAGNOSIS — I10 Essential (primary) hypertension: Secondary | ICD-10-CM | POA: Diagnosis not present

## 2012-09-12 DIAGNOSIS — Z170 Estrogen receptor positive status [ER+]: Secondary | ICD-10-CM | POA: Diagnosis not present

## 2012-09-17 ENCOUNTER — Ambulatory Visit: Payer: Self-pay | Admitting: Oncology

## 2012-09-18 ENCOUNTER — Ambulatory Visit: Payer: Self-pay | Admitting: Surgery

## 2012-09-18 DIAGNOSIS — T8189XA Other complications of procedures, not elsewhere classified, initial encounter: Secondary | ICD-10-CM | POA: Diagnosis not present

## 2012-09-18 DIAGNOSIS — Z79899 Other long term (current) drug therapy: Secondary | ICD-10-CM | POA: Diagnosis not present

## 2012-09-18 DIAGNOSIS — K449 Diaphragmatic hernia without obstruction or gangrene: Secondary | ICD-10-CM | POA: Diagnosis not present

## 2012-09-18 DIAGNOSIS — IMO0002 Reserved for concepts with insufficient information to code with codable children: Secondary | ICD-10-CM | POA: Diagnosis not present

## 2012-09-18 DIAGNOSIS — R011 Cardiac murmur, unspecified: Secondary | ICD-10-CM | POA: Diagnosis not present

## 2012-09-18 DIAGNOSIS — I440 Atrioventricular block, first degree: Secondary | ICD-10-CM | POA: Diagnosis not present

## 2012-09-18 DIAGNOSIS — I059 Rheumatic mitral valve disease, unspecified: Secondary | ICD-10-CM | POA: Diagnosis not present

## 2012-09-18 DIAGNOSIS — C50419 Malignant neoplasm of upper-outer quadrant of unspecified female breast: Secondary | ICD-10-CM | POA: Diagnosis not present

## 2012-09-18 DIAGNOSIS — K219 Gastro-esophageal reflux disease without esophagitis: Secondary | ICD-10-CM | POA: Diagnosis not present

## 2012-09-18 DIAGNOSIS — N61 Mastitis without abscess: Secondary | ICD-10-CM | POA: Diagnosis not present

## 2012-09-18 DIAGNOSIS — L988 Other specified disorders of the skin and subcutaneous tissue: Secondary | ICD-10-CM | POA: Diagnosis not present

## 2012-09-18 DIAGNOSIS — D649 Anemia, unspecified: Secondary | ICD-10-CM | POA: Diagnosis not present

## 2012-09-18 DIAGNOSIS — T8131XA Disruption of external operation (surgical) wound, not elsewhere classified, initial encounter: Secondary | ICD-10-CM | POA: Diagnosis not present

## 2012-09-18 DIAGNOSIS — I10 Essential (primary) hypertension: Secondary | ICD-10-CM | POA: Diagnosis not present

## 2012-09-18 DIAGNOSIS — L989 Disorder of the skin and subcutaneous tissue, unspecified: Secondary | ICD-10-CM | POA: Diagnosis not present

## 2012-09-19 ENCOUNTER — Ambulatory Visit: Payer: Self-pay | Admitting: Oncology

## 2012-09-20 LAB — PATHOLOGY REPORT

## 2012-10-16 DIAGNOSIS — C50919 Malignant neoplasm of unspecified site of unspecified female breast: Secondary | ICD-10-CM | POA: Diagnosis not present

## 2012-10-16 DIAGNOSIS — E785 Hyperlipidemia, unspecified: Secondary | ICD-10-CM | POA: Diagnosis not present

## 2012-10-16 DIAGNOSIS — N760 Acute vaginitis: Secondary | ICD-10-CM | POA: Diagnosis not present

## 2012-10-16 DIAGNOSIS — R5381 Other malaise: Secondary | ICD-10-CM | POA: Diagnosis not present

## 2012-10-16 DIAGNOSIS — R300 Dysuria: Secondary | ICD-10-CM | POA: Diagnosis not present

## 2012-10-16 DIAGNOSIS — N72 Inflammatory disease of cervix uteri: Secondary | ICD-10-CM | POA: Diagnosis not present

## 2012-10-16 DIAGNOSIS — R5383 Other fatigue: Secondary | ICD-10-CM | POA: Diagnosis not present

## 2012-10-22 DIAGNOSIS — I10 Essential (primary) hypertension: Secondary | ICD-10-CM | POA: Diagnosis not present

## 2012-10-22 DIAGNOSIS — E871 Hypo-osmolality and hyponatremia: Secondary | ICD-10-CM | POA: Diagnosis not present

## 2012-10-22 DIAGNOSIS — R42 Dizziness and giddiness: Secondary | ICD-10-CM | POA: Diagnosis not present

## 2012-10-24 ENCOUNTER — Ambulatory Visit: Payer: Self-pay | Admitting: Pain Medicine

## 2012-10-24 DIAGNOSIS — IMO0002 Reserved for concepts with insufficient information to code with codable children: Secondary | ICD-10-CM | POA: Diagnosis not present

## 2012-10-29 ENCOUNTER — Ambulatory Visit: Payer: Self-pay | Admitting: Oncology

## 2012-10-29 DIAGNOSIS — R5381 Other malaise: Secondary | ICD-10-CM | POA: Diagnosis not present

## 2012-10-29 DIAGNOSIS — Z170 Estrogen receptor positive status [ER+]: Secondary | ICD-10-CM | POA: Diagnosis not present

## 2012-10-29 DIAGNOSIS — E785 Hyperlipidemia, unspecified: Secondary | ICD-10-CM | POA: Diagnosis not present

## 2012-10-29 DIAGNOSIS — C50919 Malignant neoplasm of unspecified site of unspecified female breast: Secondary | ICD-10-CM | POA: Diagnosis not present

## 2012-10-29 DIAGNOSIS — Z7982 Long term (current) use of aspirin: Secondary | ICD-10-CM | POA: Diagnosis not present

## 2012-10-29 DIAGNOSIS — Z5111 Encounter for antineoplastic chemotherapy: Secondary | ICD-10-CM | POA: Diagnosis not present

## 2012-10-29 DIAGNOSIS — Z79899 Other long term (current) drug therapy: Secondary | ICD-10-CM | POA: Diagnosis not present

## 2012-10-29 DIAGNOSIS — D702 Other drug-induced agranulocytosis: Secondary | ICD-10-CM | POA: Diagnosis not present

## 2012-10-29 DIAGNOSIS — M549 Dorsalgia, unspecified: Secondary | ICD-10-CM | POA: Diagnosis not present

## 2012-10-29 DIAGNOSIS — I059 Rheumatic mitral valve disease, unspecified: Secondary | ICD-10-CM | POA: Diagnosis not present

## 2012-10-29 DIAGNOSIS — F411 Generalized anxiety disorder: Secondary | ICD-10-CM | POA: Diagnosis not present

## 2012-10-29 DIAGNOSIS — Z90710 Acquired absence of both cervix and uterus: Secondary | ICD-10-CM | POA: Diagnosis not present

## 2012-10-29 DIAGNOSIS — R110 Nausea: Secondary | ICD-10-CM | POA: Diagnosis not present

## 2012-10-29 DIAGNOSIS — R630 Anorexia: Secondary | ICD-10-CM | POA: Diagnosis not present

## 2012-10-29 DIAGNOSIS — I10 Essential (primary) hypertension: Secondary | ICD-10-CM | POA: Diagnosis not present

## 2012-10-29 DIAGNOSIS — G8929 Other chronic pain: Secondary | ICD-10-CM | POA: Diagnosis not present

## 2012-10-29 DIAGNOSIS — Z9010 Acquired absence of unspecified breast and nipple: Secondary | ICD-10-CM | POA: Diagnosis not present

## 2012-10-29 LAB — CBC CANCER CENTER
Basophil #: 0.1 x10 3/mm (ref 0.0–0.1)
Basophil %: 0.8 %
Eosinophil %: 0.5 %
HCT: 39.6 % (ref 35.0–47.0)
HGB: 13 g/dL (ref 12.0–16.0)
Lymphocyte %: 30 %
MCHC: 32.8 g/dL (ref 32.0–36.0)
MCV: 90 fL (ref 80–100)
Monocyte %: 8.1 %
Neutrophil #: 5 x10 3/mm (ref 1.4–6.5)
RBC: 4.41 10*6/uL (ref 3.80–5.20)
WBC: 8.2 x10 3/mm (ref 3.6–11.0)

## 2012-10-29 LAB — COMPREHENSIVE METABOLIC PANEL
Albumin: 4 g/dL (ref 3.4–5.0)
Alkaline Phosphatase: 61 U/L (ref 50–136)
Anion Gap: 9 (ref 7–16)
Bilirubin,Total: 0.3 mg/dL (ref 0.2–1.0)
Calcium, Total: 9.4 mg/dL (ref 8.5–10.1)
Co2: 27 mmol/L (ref 21–32)
EGFR (African American): >60
EGFR (Non-African Amer.): 59 — ABNORMAL LOW
Glucose: 84 mg/dL (ref 65–99)
Osmolality: 270 (ref 275–301)
Potassium: 4.2 mmol/L (ref 3.5–5.1)
SGOT(AST): 21 U/L (ref 15–37)

## 2012-10-30 LAB — CANCER ANTIGEN 27.29: CA 27.29: 33.3 U/mL (ref 0.0–38.6)

## 2012-11-01 DIAGNOSIS — C50919 Malignant neoplasm of unspecified site of unspecified female breast: Secondary | ICD-10-CM | POA: Diagnosis not present

## 2012-11-01 DIAGNOSIS — Z170 Estrogen receptor positive status [ER+]: Secondary | ICD-10-CM | POA: Diagnosis not present

## 2012-11-01 LAB — COMPREHENSIVE METABOLIC PANEL
Albumin: 3.8 g/dL (ref 3.4–5.0)
Alkaline Phosphatase: 60 U/L (ref 50–136)
BUN: 14 mg/dL (ref 7–18)
Bilirubin,Total: 0.4 mg/dL (ref 0.2–1.0)
Calcium, Total: 9.3 mg/dL (ref 8.5–10.1)
Chloride: 98 mmol/L (ref 98–107)
Creatinine: 0.94 mg/dL (ref 0.60–1.30)
EGFR (African American): >60
EGFR (Non-African Amer.): 59 — ABNORMAL LOW
Glucose: 86 mg/dL (ref 65–99)
Potassium: 3.9 mmol/L (ref 3.5–5.1)
SGOT(AST): 19 U/L (ref 15–37)
SGPT (ALT): 29 U/L (ref 12–78)
Total Protein: 7.4 g/dL (ref 6.4–8.2)

## 2012-11-01 LAB — CBC CANCER CENTER
Basophil #: 0.1 x10 3/mm (ref 0.0–0.1)
Eosinophil %: 1.2 %
Lymphocyte #: 2.3 x10 3/mm (ref 1.0–3.6)
MCHC: 31.9 g/dL — ABNORMAL LOW (ref 32.0–36.0)
MCV: 91 fL (ref 80–100)
Monocyte #: 0.7 x10 3/mm (ref 0.2–0.9)
Monocyte %: 8.5 %
Neutrophil #: 4.7 x10 3/mm (ref 1.4–6.5)
Neutrophil %: 59.8 %
Platelet: 269 x10 3/mm (ref 150–440)
RBC: 4.55 10*6/uL (ref 3.80–5.20)
RDW: 13.1 % (ref 11.5–14.5)
WBC: 7.9 x10 3/mm (ref 3.6–11.0)

## 2012-11-05 DIAGNOSIS — J019 Acute sinusitis, unspecified: Secondary | ICD-10-CM | POA: Diagnosis not present

## 2012-11-05 DIAGNOSIS — I10 Essential (primary) hypertension: Secondary | ICD-10-CM | POA: Diagnosis not present

## 2012-11-08 DIAGNOSIS — R110 Nausea: Secondary | ICD-10-CM | POA: Diagnosis not present

## 2012-11-08 DIAGNOSIS — D702 Other drug-induced agranulocytosis: Secondary | ICD-10-CM | POA: Diagnosis not present

## 2012-11-08 DIAGNOSIS — C50919 Malignant neoplasm of unspecified site of unspecified female breast: Secondary | ICD-10-CM | POA: Diagnosis not present

## 2012-11-08 DIAGNOSIS — Z170 Estrogen receptor positive status [ER+]: Secondary | ICD-10-CM | POA: Diagnosis not present

## 2012-11-08 LAB — BASIC METABOLIC PANEL
Anion Gap: 9 (ref 7–16)
BUN: 16 mg/dL (ref 7–18)
Chloride: 101 mmol/L (ref 98–107)
Creatinine: 0.91 mg/dL (ref 0.60–1.30)
EGFR (African American): >60
EGFR (Non-African Amer.): >60
Glucose: 101 mg/dL — ABNORMAL HIGH (ref 65–99)
Osmolality: 273 (ref 275–301)
Sodium: 136 mmol/L (ref 136–145)

## 2012-11-08 LAB — CBC CANCER CENTER
Basophil #: 0 x10 3/mm (ref 0.0–0.1)
Basophil %: 4.3 %
HCT: 35.3 % (ref 35.0–47.0)
HGB: 11.5 g/dL — ABNORMAL LOW (ref 12.0–16.0)
Lymphocyte %: 69.8 %
MCH: 29.4 pg (ref 26.0–34.0)
MCHC: 32.7 g/dL (ref 32.0–36.0)
Monocyte #: 0 x10 3/mm — ABNORMAL LOW (ref 0.2–0.9)
Neutrophil %: 15.4 %
Platelet: 201 x10 3/mm (ref 150–440)
RDW: 13.1 % (ref 11.5–14.5)
WBC: 0.7 x10 3/mm — CL (ref 3.6–11.0)

## 2012-11-11 ENCOUNTER — Ambulatory Visit: Payer: Self-pay | Admitting: Pain Medicine

## 2012-11-11 DIAGNOSIS — M79609 Pain in unspecified limb: Secondary | ICD-10-CM | POA: Diagnosis not present

## 2012-11-11 DIAGNOSIS — M545 Low back pain, unspecified: Secondary | ICD-10-CM | POA: Diagnosis not present

## 2012-11-11 DIAGNOSIS — M533 Sacrococcygeal disorders, not elsewhere classified: Secondary | ICD-10-CM | POA: Diagnosis not present

## 2012-11-11 DIAGNOSIS — M25559 Pain in unspecified hip: Secondary | ICD-10-CM | POA: Diagnosis not present

## 2012-11-11 DIAGNOSIS — M461 Sacroiliitis, not elsewhere classified: Secondary | ICD-10-CM | POA: Diagnosis not present

## 2012-11-11 DIAGNOSIS — M47817 Spondylosis without myelopathy or radiculopathy, lumbosacral region: Secondary | ICD-10-CM | POA: Diagnosis not present

## 2012-11-11 DIAGNOSIS — M4716 Other spondylosis with myelopathy, lumbar region: Secondary | ICD-10-CM | POA: Diagnosis not present

## 2012-11-11 DIAGNOSIS — Z79899 Other long term (current) drug therapy: Secondary | ICD-10-CM | POA: Diagnosis not present

## 2012-11-11 DIAGNOSIS — G8929 Other chronic pain: Secondary | ICD-10-CM | POA: Diagnosis not present

## 2012-11-11 DIAGNOSIS — IMO0002 Reserved for concepts with insufficient information to code with codable children: Secondary | ICD-10-CM | POA: Diagnosis not present

## 2012-11-13 LAB — CBC CANCER CENTER
Basophil #: 0.1 x10 3/mm (ref 0.0–0.1)
Eosinophil %: 2.1 %
HGB: 11.4 g/dL — ABNORMAL LOW (ref 12.0–16.0)
Lymphocyte %: 61.6 %
MCV: 88 fL (ref 80–100)
Monocyte %: 24.9 %
Neutrophil %: 8.6 %
Platelet: 260 x10 3/mm (ref 150–440)
RBC: 3.91 10*6/uL (ref 3.80–5.20)
RDW: 13.2 % (ref 11.5–14.5)
WBC: 2 x10 3/mm — CL (ref 3.6–11.0)

## 2012-11-17 ENCOUNTER — Ambulatory Visit: Payer: Self-pay | Admitting: Oncology

## 2012-11-17 DIAGNOSIS — Z7982 Long term (current) use of aspirin: Secondary | ICD-10-CM | POA: Diagnosis not present

## 2012-11-17 DIAGNOSIS — G620 Drug-induced polyneuropathy: Secondary | ICD-10-CM | POA: Diagnosis not present

## 2012-11-17 DIAGNOSIS — Z170 Estrogen receptor positive status [ER+]: Secondary | ICD-10-CM | POA: Diagnosis not present

## 2012-11-17 DIAGNOSIS — G8929 Other chronic pain: Secondary | ICD-10-CM | POA: Diagnosis not present

## 2012-11-17 DIAGNOSIS — Z90710 Acquired absence of both cervix and uterus: Secondary | ICD-10-CM | POA: Diagnosis not present

## 2012-11-17 DIAGNOSIS — M549 Dorsalgia, unspecified: Secondary | ICD-10-CM | POA: Diagnosis not present

## 2012-11-17 DIAGNOSIS — E785 Hyperlipidemia, unspecified: Secondary | ICD-10-CM | POA: Diagnosis not present

## 2012-11-17 DIAGNOSIS — Z9010 Acquired absence of unspecified breast and nipple: Secondary | ICD-10-CM | POA: Diagnosis not present

## 2012-11-17 DIAGNOSIS — Z5111 Encounter for antineoplastic chemotherapy: Secondary | ICD-10-CM | POA: Diagnosis not present

## 2012-11-17 DIAGNOSIS — I059 Rheumatic mitral valve disease, unspecified: Secondary | ICD-10-CM | POA: Diagnosis not present

## 2012-11-17 DIAGNOSIS — J019 Acute sinusitis, unspecified: Secondary | ICD-10-CM | POA: Diagnosis not present

## 2012-11-17 DIAGNOSIS — F411 Generalized anxiety disorder: Secondary | ICD-10-CM | POA: Diagnosis not present

## 2012-11-17 DIAGNOSIS — I10 Essential (primary) hypertension: Secondary | ICD-10-CM | POA: Diagnosis not present

## 2012-11-17 DIAGNOSIS — Z79899 Other long term (current) drug therapy: Secondary | ICD-10-CM | POA: Diagnosis not present

## 2012-11-17 DIAGNOSIS — R5381 Other malaise: Secondary | ICD-10-CM | POA: Diagnosis not present

## 2012-11-17 DIAGNOSIS — C50919 Malignant neoplasm of unspecified site of unspecified female breast: Secondary | ICD-10-CM | POA: Diagnosis not present

## 2012-11-22 DIAGNOSIS — G620 Drug-induced polyneuropathy: Secondary | ICD-10-CM | POA: Diagnosis not present

## 2012-11-22 DIAGNOSIS — Z170 Estrogen receptor positive status [ER+]: Secondary | ICD-10-CM | POA: Diagnosis not present

## 2012-11-22 DIAGNOSIS — C50919 Malignant neoplasm of unspecified site of unspecified female breast: Secondary | ICD-10-CM | POA: Diagnosis not present

## 2012-11-22 DIAGNOSIS — R5383 Other fatigue: Secondary | ICD-10-CM | POA: Diagnosis not present

## 2012-11-22 LAB — CBC CANCER CENTER
Basophil %: 1.3 %
Eosinophil #: 0 x10 3/mm (ref 0.0–0.7)
Eosinophil %: 0.8 %
Lymphocyte #: 1.2 x10 3/mm (ref 1.0–3.6)
MCH: 29.9 pg (ref 26.0–34.0)
Monocyte %: 10.3 %
Neutrophil #: 4 x10 3/mm (ref 1.4–6.5)
Neutrophil %: 67.2 %
RBC: 4.05 10*6/uL (ref 3.80–5.20)
WBC: 5.9 x10 3/mm (ref 3.6–11.0)

## 2012-11-22 LAB — BASIC METABOLIC PANEL
Anion Gap: 8 (ref 7–16)
Calcium, Total: 8.9 mg/dL (ref 8.5–10.1)
Chloride: 100 mmol/L (ref 98–107)
Co2: 29 mmol/L (ref 21–32)
Osmolality: 275 (ref 275–301)
Potassium: 4 mmol/L (ref 3.5–5.1)

## 2012-11-29 LAB — CBC CANCER CENTER
Blast: 2 %
HCT: 34.5 % — ABNORMAL LOW (ref 35.0–47.0)
HGB: 11.5 g/dL — ABNORMAL LOW (ref 12.0–16.0)
Lymphocytes: 6 %
MCH: 29.3 pg (ref 26.0–34.0)
MCHC: 33.4 g/dL (ref 32.0–36.0)
MCV: 88 fL (ref 80–100)
Metamyelocyte: 9 %
Myelocyte: 19 %
Platelet: 163 x10 3/mm (ref 150–440)
Promyelocyte: 4 %
RBC: 3.93 10*6/uL (ref 3.80–5.20)
Variant Lymphocyte: 4 %

## 2012-11-29 LAB — BASIC METABOLIC PANEL
Anion Gap: 9 (ref 7–16)
BUN: 13 mg/dL (ref 7–18)
Calcium, Total: 8.8 mg/dL (ref 8.5–10.1)
Chloride: 99 mmol/L (ref 98–107)
Co2: 26 mmol/L (ref 21–32)
Creatinine: 1.04 mg/dL (ref 0.60–1.30)
Osmolality: 270 (ref 275–301)
Potassium: 4.1 mmol/L (ref 3.5–5.1)

## 2012-12-13 DIAGNOSIS — Z170 Estrogen receptor positive status [ER+]: Secondary | ICD-10-CM | POA: Diagnosis not present

## 2012-12-13 DIAGNOSIS — C50919 Malignant neoplasm of unspecified site of unspecified female breast: Secondary | ICD-10-CM | POA: Diagnosis not present

## 2012-12-13 LAB — CBC CANCER CENTER
Eosinophil #: 0 x10 3/mm (ref 0.0–0.7)
Eosinophil %: 0.6 %
HCT: 31.1 % — ABNORMAL LOW (ref 35.0–47.0)
Lymphocyte #: 1.2 x10 3/mm (ref 1.0–3.6)
MCV: 89 fL (ref 80–100)
Monocyte %: 13 %
Neutrophil #: 3.5 x10 3/mm (ref 1.4–6.5)
Neutrophil %: 62.9 %
RBC: 3.51 10*6/uL — ABNORMAL LOW (ref 3.80–5.20)
RDW: 15.6 % — ABNORMAL HIGH (ref 11.5–14.5)
WBC: 5.5 x10 3/mm (ref 3.6–11.0)

## 2012-12-13 LAB — BASIC METABOLIC PANEL
Anion Gap: 8 (ref 7–16)
BUN: 16 mg/dL (ref 7–18)
Calcium, Total: 8.6 mg/dL (ref 8.5–10.1)
Chloride: 104 mmol/L (ref 98–107)
Co2: 28 mmol/L (ref 21–32)
EGFR (Non-African Amer.): 55 — ABNORMAL LOW
Glucose: 109 mg/dL — ABNORMAL HIGH (ref 65–99)
Osmolality: 281 (ref 275–301)
Potassium: 4.3 mmol/L (ref 3.5–5.1)
Sodium: 140 mmol/L (ref 136–145)

## 2012-12-18 ENCOUNTER — Ambulatory Visit: Payer: Self-pay | Admitting: Oncology

## 2012-12-18 DIAGNOSIS — G8929 Other chronic pain: Secondary | ICD-10-CM | POA: Diagnosis not present

## 2012-12-18 DIAGNOSIS — Z170 Estrogen receptor positive status [ER+]: Secondary | ICD-10-CM | POA: Diagnosis not present

## 2012-12-18 DIAGNOSIS — Z5111 Encounter for antineoplastic chemotherapy: Secondary | ICD-10-CM | POA: Diagnosis not present

## 2012-12-18 DIAGNOSIS — I10 Essential (primary) hypertension: Secondary | ICD-10-CM | POA: Diagnosis not present

## 2012-12-18 DIAGNOSIS — I059 Rheumatic mitral valve disease, unspecified: Secondary | ICD-10-CM | POA: Diagnosis not present

## 2012-12-18 DIAGNOSIS — Z9010 Acquired absence of unspecified breast and nipple: Secondary | ICD-10-CM | POA: Diagnosis not present

## 2012-12-18 DIAGNOSIS — IMO0002 Reserved for concepts with insufficient information to code with codable children: Secondary | ICD-10-CM | POA: Diagnosis not present

## 2012-12-18 DIAGNOSIS — M549 Dorsalgia, unspecified: Secondary | ICD-10-CM | POA: Diagnosis not present

## 2012-12-18 DIAGNOSIS — D702 Other drug-induced agranulocytosis: Secondary | ICD-10-CM | POA: Diagnosis not present

## 2012-12-18 DIAGNOSIS — Z79899 Other long term (current) drug therapy: Secondary | ICD-10-CM | POA: Diagnosis not present

## 2012-12-18 DIAGNOSIS — Z7982 Long term (current) use of aspirin: Secondary | ICD-10-CM | POA: Diagnosis not present

## 2012-12-18 DIAGNOSIS — F411 Generalized anxiety disorder: Secondary | ICD-10-CM | POA: Diagnosis not present

## 2012-12-18 DIAGNOSIS — C50919 Malignant neoplasm of unspecified site of unspecified female breast: Secondary | ICD-10-CM | POA: Diagnosis not present

## 2012-12-18 DIAGNOSIS — Z90710 Acquired absence of both cervix and uterus: Secondary | ICD-10-CM | POA: Diagnosis not present

## 2012-12-18 DIAGNOSIS — E785 Hyperlipidemia, unspecified: Secondary | ICD-10-CM | POA: Diagnosis not present

## 2012-12-27 LAB — CBC CANCER CENTER
Basophil #: 0.1 x10 3/mm (ref 0.0–0.1)
Basophil %: 1.3 %
Eosinophil #: 0 x10 3/mm (ref 0.0–0.7)
HCT: 32.7 % — ABNORMAL LOW (ref 35.0–47.0)
HGB: 11 g/dL — ABNORMAL LOW (ref 12.0–16.0)
Lymphocyte %: 12.3 %
MCHC: 33.7 g/dL (ref 32.0–36.0)
MCV: 90 fL (ref 80–100)
Monocyte #: 0.5 x10 3/mm (ref 0.2–0.9)
Monocyte %: 5.1 %
Neutrophil %: 80.9 %
Platelet: 191 x10 3/mm (ref 150–440)
RDW: 16.5 % — ABNORMAL HIGH (ref 11.5–14.5)
WBC: 9 x10 3/mm (ref 3.6–11.0)

## 2013-01-03 DIAGNOSIS — C50919 Malignant neoplasm of unspecified site of unspecified female breast: Secondary | ICD-10-CM | POA: Diagnosis not present

## 2013-01-03 DIAGNOSIS — Z170 Estrogen receptor positive status [ER+]: Secondary | ICD-10-CM | POA: Diagnosis not present

## 2013-01-03 LAB — CBC CANCER CENTER
Basophil #: 0.1 x10 3/mm (ref 0.0–0.1)
Basophil %: 2.6 %
Eosinophil %: 0.1 %
HCT: 33.6 % — ABNORMAL LOW (ref 35.0–47.0)
HGB: 11.4 g/dL — ABNORMAL LOW (ref 12.0–16.0)
Lymphocyte #: 1.2 x10 3/mm (ref 1.0–3.6)
Lymphocyte %: 24.3 %
MCH: 30.3 pg (ref 26.0–34.0)
MCHC: 33.9 g/dL (ref 32.0–36.0)
Monocyte #: 0.5 x10 3/mm (ref 0.2–0.9)
Monocyte %: 10.8 %
Neutrophil #: 3.1 x10 3/mm (ref 1.4–6.5)
Neutrophil %: 62.2 %
Platelet: 272 x10 3/mm (ref 150–440)
RBC: 3.75 10*6/uL — ABNORMAL LOW (ref 3.80–5.20)
WBC: 5 x10 3/mm (ref 3.6–11.0)

## 2013-01-03 LAB — COMPREHENSIVE METABOLIC PANEL
Alkaline Phosphatase: 80 U/L (ref 50–136)
Bilirubin,Total: 0.3 mg/dL (ref 0.2–1.0)
Calcium, Total: 8.7 mg/dL (ref 8.5–10.1)
Co2: 26 mmol/L (ref 21–32)
Creatinine: 1.07 mg/dL (ref 0.60–1.30)
EGFR (African American): 59 — ABNORMAL LOW
EGFR (Non-African Amer.): 51 — ABNORMAL LOW
Osmolality: 277 (ref 275–301)
Potassium: 4.5 mmol/L (ref 3.5–5.1)
Sodium: 137 mmol/L (ref 136–145)
Total Protein: 6.8 g/dL (ref 6.4–8.2)

## 2013-01-04 DIAGNOSIS — R300 Dysuria: Secondary | ICD-10-CM | POA: Diagnosis not present

## 2013-01-10 DIAGNOSIS — C50919 Malignant neoplasm of unspecified site of unspecified female breast: Secondary | ICD-10-CM | POA: Diagnosis not present

## 2013-01-10 DIAGNOSIS — Z170 Estrogen receptor positive status [ER+]: Secondary | ICD-10-CM | POA: Diagnosis not present

## 2013-01-18 ENCOUNTER — Ambulatory Visit: Payer: Self-pay | Admitting: Oncology

## 2013-01-18 DIAGNOSIS — Z510 Encounter for antineoplastic radiation therapy: Secondary | ICD-10-CM | POA: Diagnosis not present

## 2013-01-18 DIAGNOSIS — Z09 Encounter for follow-up examination after completed treatment for conditions other than malignant neoplasm: Secondary | ICD-10-CM | POA: Diagnosis not present

## 2013-01-18 DIAGNOSIS — C50919 Malignant neoplasm of unspecified site of unspecified female breast: Secondary | ICD-10-CM | POA: Diagnosis not present

## 2013-01-28 DIAGNOSIS — C50919 Malignant neoplasm of unspecified site of unspecified female breast: Secondary | ICD-10-CM | POA: Diagnosis not present

## 2013-01-30 DIAGNOSIS — C50919 Malignant neoplasm of unspecified site of unspecified female breast: Secondary | ICD-10-CM | POA: Diagnosis not present

## 2013-02-03 DIAGNOSIS — C50919 Malignant neoplasm of unspecified site of unspecified female breast: Secondary | ICD-10-CM | POA: Diagnosis not present

## 2013-02-04 DIAGNOSIS — C50919 Malignant neoplasm of unspecified site of unspecified female breast: Secondary | ICD-10-CM | POA: Diagnosis not present

## 2013-02-07 ENCOUNTER — Other Ambulatory Visit: Payer: Self-pay | Admitting: *Deleted

## 2013-02-07 LAB — CBC CANCER CENTER
Eosinophil #: 0.7 x10 3/mm (ref 0.0–0.7)
Eosinophil %: 8.9 %
Lymphocyte #: 1.4 x10 3/mm (ref 1.0–3.6)
MCH: 30.4 pg (ref 26.0–34.0)
MCHC: 33.8 g/dL (ref 32.0–36.0)
Monocyte #: 0.7 x10 3/mm (ref 0.2–0.9)
Monocyte %: 9.6 %
RBC: 3.42 10*6/uL — ABNORMAL LOW (ref 3.80–5.20)
RDW: 15.8 % — ABNORMAL HIGH (ref 11.5–14.5)
WBC: 7.8 x10 3/mm (ref 3.6–11.0)

## 2013-02-10 ENCOUNTER — Other Ambulatory Visit: Payer: Self-pay | Admitting: *Deleted

## 2013-02-10 MED ORDER — METOPROLOL SUCCINATE ER 25 MG PO TB24: 25.0000 mg | ORAL_TABLET | Freq: Two times a day (BID) | ORAL | Status: DC

## 2013-02-10 MED ORDER — FLUOXETINE HCL 10 MG PO CAPS: 10.0000 mg | ORAL_CAPSULE | Freq: Every day | ORAL | Status: DC

## 2013-02-10 NOTE — Telephone Encounter (Signed)
Patient has not been seen here yet, ok to refill med?

## 2013-02-10 NOTE — Telephone Encounter (Signed)
Ok to fill x 1 - same dose and directions.

## 2013-02-10 NOTE — Telephone Encounter (Signed)
Sent in to pharmacy.  

## 2013-02-11 DIAGNOSIS — C50919 Malignant neoplasm of unspecified site of unspecified female breast: Secondary | ICD-10-CM | POA: Diagnosis not present

## 2013-02-15 ENCOUNTER — Ambulatory Visit: Payer: Self-pay | Admitting: Radiation Oncology

## 2013-02-15 DIAGNOSIS — C50919 Malignant neoplasm of unspecified site of unspecified female breast: Secondary | ICD-10-CM | POA: Diagnosis not present

## 2013-02-15 DIAGNOSIS — Z09 Encounter for follow-up examination after completed treatment for conditions other than malignant neoplasm: Secondary | ICD-10-CM | POA: Diagnosis not present

## 2013-02-15 DIAGNOSIS — Z510 Encounter for antineoplastic radiation therapy: Secondary | ICD-10-CM | POA: Diagnosis not present

## 2013-02-18 DIAGNOSIS — C50919 Malignant neoplasm of unspecified site of unspecified female breast: Secondary | ICD-10-CM | POA: Diagnosis not present

## 2013-02-20 ENCOUNTER — Encounter: Payer: Self-pay | Admitting: Internal Medicine

## 2013-02-20 ENCOUNTER — Encounter: Payer: Self-pay | Admitting: *Deleted

## 2013-02-20 ENCOUNTER — Ambulatory Visit (INDEPENDENT_AMBULATORY_CARE_PROVIDER_SITE_OTHER): Payer: Medicare Other | Admitting: Internal Medicine

## 2013-02-20 VITALS — BP 130/88 | HR 72 | Temp 98.8°F | Ht 63.16 in | Wt 181.8 lb

## 2013-02-20 DIAGNOSIS — I10 Essential (primary) hypertension: Secondary | ICD-10-CM

## 2013-02-20 DIAGNOSIS — N8111 Cystocele, midline: Secondary | ICD-10-CM

## 2013-02-20 DIAGNOSIS — N390 Urinary tract infection, site not specified: Secondary | ICD-10-CM | POA: Diagnosis not present

## 2013-02-20 DIAGNOSIS — R300 Dysuria: Secondary | ICD-10-CM | POA: Diagnosis not present

## 2013-02-20 DIAGNOSIS — E785 Hyperlipidemia, unspecified: Secondary | ICD-10-CM

## 2013-02-20 DIAGNOSIS — D649 Anemia, unspecified: Secondary | ICD-10-CM

## 2013-02-20 LAB — URINALYSIS, ROUTINE W REFLEX MICROSCOPIC
Hgb urine dipstick: NEGATIVE
Ketones, ur: NEGATIVE
Specific Gravity, Urine: 1.01 (ref 1.000–1.030)
Total Protein, Urine: NEGATIVE
Urobilinogen, UA: 0.2 (ref 0.0–1.0)
pH: 8 (ref 5.0–8.0)

## 2013-02-21 LAB — CBC CANCER CENTER
HCT: 35.7 % (ref 35.0–47.0)
HGB: 11.6 g/dL — ABNORMAL LOW (ref 12.0–16.0)
Lymphocyte #: 0.9 x10 3/mm — ABNORMAL LOW (ref 1.0–3.6)
Lymphocyte %: 15.3 %
MCH: 28.8 pg (ref 26.0–34.0)
MCV: 88 fL (ref 80–100)
Monocyte %: 11.7 %
Neutrophil %: 62.1 %
Platelet: 226 x10 3/mm (ref 150–440)
RBC: 4.03 10*6/uL (ref 3.80–5.20)
WBC: 5.9 x10 3/mm (ref 3.6–11.0)

## 2013-02-21 LAB — BASIC METABOLIC PANEL
BUN: 15 mg/dL (ref 7–18)
Calcium, Total: 8.9 mg/dL (ref 8.5–10.1)
Creatinine: 0.99 mg/dL (ref 0.60–1.30)
Osmolality: 278 (ref 275–301)

## 2013-02-22 ENCOUNTER — Encounter: Payer: Self-pay | Admitting: Internal Medicine

## 2013-02-22 DIAGNOSIS — N390 Urinary tract infection, site not specified: Secondary | ICD-10-CM | POA: Insufficient documentation

## 2013-02-22 DIAGNOSIS — IMO0002 Reserved for concepts with insufficient information to code with codable children: Secondary | ICD-10-CM | POA: Insufficient documentation

## 2013-02-22 DIAGNOSIS — D649 Anemia, unspecified: Secondary | ICD-10-CM | POA: Insufficient documentation

## 2013-02-22 DIAGNOSIS — C50211 Malignant neoplasm of upper-inner quadrant of right female breast: Secondary | ICD-10-CM | POA: Insufficient documentation

## 2013-02-22 HISTORY — DX: Reserved for concepts with insufficient information to code with codable children: IMO0002

## 2013-02-22 LAB — CULTURE, URINE COMPREHENSIVE: Organism ID, Bacteria: NO GROWTH

## 2013-02-22 NOTE — Progress Notes (Signed)
Subjective:    Patient ID: Wanda Martinez, female    DOB: 08/16/1935, 78 y.o.   MRN: [id number]  HPI 78 year old female with past history of recurrent breast cancer s/p mastectomy, hypertension, hyperlipidemia and alopecia who comes in today for a scheduled follow up.  She was recently diagnosed with recurrent breast cancer on the right.  Is s/p right mastectomy.  Did have one lymph node positive.  Received chemotherapy and now XRT.  Does report some fatigue.  Previous nausea and decreased appetite.  Eating better now.  No nausea.  Breathing stable.  Off evista.  Bowels moving. Noticed some burning in her vaginal area - when she was receiving chemo.  Still some burning - wants her urine checked.    Past Medical History  Diagnosis Date  . Breast cancer     Masectomy - left - 1986   . GERD (gastroesophageal reflux disease)   . Hiatal hernia   . HTN (hypertension)   . Chronic cystitis   . Urinary incontinence     mixed   . Arthritis   . Anemia     Current Outpatient Prescriptions on File Prior to Visit  Medication Sig Dispense Refill  . aspirin 81 MG EC tablet Take 81 mg by mouth daily as needed.        . Calcium Carbonate (CALCIUM 600) 1500 MG TABS Take 1 tablet by mouth daily.        Marland Kitchen FLUoxetine (PROZAC) 10 MG capsule Take 1 capsule (10 mg total) by mouth daily.  30 capsule  0  . hydrochlorothiazide 25 MG tablet Take 25 mg by mouth daily.        Marland Kitchen HYDROcodone-acetaminophen (NORCO) 5-325 MG per tablet Take 0.5-1 tablets by mouth daily.        . meloxicam (MOBIC) 7.5 MG tablet Take 7.5 mg by mouth daily.        . metoprolol succinate (TOPROL-XL) 25 MG 24 hr tablet Take 1 tablet (25 mg total) by mouth 2 (two) times daily.  60 tablet  0  . omeprazole (PRILOSEC) 20 MG capsule Take 20 mg by mouth daily.        . Glucosamine-Chondroit-Vit C-Mn (GLUCOSAMINE CHONDROITIN COMPLX) CAPS Take 2 capsules by mouth daily.         No current facility-administered medications on file prior to visit.     Review of Systems Patient denies any headache, lightheadedness or dizziness.  No significant sinus problems.  No chest pain, tightness or palpitations.  No increased shortness of breath, cough or congestion.  Breathing stable.   No nausea or vomiting.  No abdominal pain or cramping.  Appetite better.  No bowel change, such as diarrhea, constipation, BRBPR or melana.  Burning as outlined.  Wants her urine checked.        Objective:   Physical Exam Filed Vitals:   02/20/13 0918  BP: 130/88  Pulse: 72  Temp: 98.8 F (37.1 C)   Blood pressure recheck:  138/78, pulse 66  78 year old female in no acute distress.   HEENT:  Nares- clear.  Oropharynx - without lesions. NECK:  Supple.  Nontender.  No audible bruit.  HEART:  Appears to be regular. LUNGS:  No crackles or wheezing audible.  Respirations even and unlabored.  RADIAL PULSE:  Equal bilaterally.   ABDOMEN:  Soft, nontender.  Bowel sounds present and normal.  No audible abdominal bruit.   EXTREMITIES:  No increased edema present.  DP pulses palpable and equal  bilaterally.       SKIN:  No rashes.      Assessment & Plan:  QUESTION OF DYSURIA.  Will check urinalysis to confirm no infection.    PREVIOUS ABDOMINAL PAIN.  Did not report today.  Previous pelvic ultrasound unrevealing.  CT unrevealing.  Colonoscopy 03/01/12 revealed diverticulosis and internal hemorrhoids.  Follow.   PULMONARY.  Stable.    HEALTH MAINTENANCE.  Physical 10/16/12.  Colonoscopy as outlined.  Mammograms through oncology.

## 2013-02-22 NOTE — Assessment & Plan Note (Signed)
Blood pressure has been under reasonable control.  Same medication regimen.  She is back on HCTZ now.  Need to check sodium.  Was stopped for a while.  Had issues with low sodium.  Check metabolic panel.

## 2013-02-22 NOTE — Assessment & Plan Note (Signed)
Has seen Dr Achilles Dunk.  Will recheck urine today to confirm no infection.

## 2013-02-22 NOTE — Assessment & Plan Note (Signed)
Has a known cystocele/rectocele.  Has seen Dr Harold Hedge.  Follow.

## 2013-02-22 NOTE — Assessment & Plan Note (Signed)
Low cholesterol diet.  Follow lipid panel.  On no cholesterol medicine now.   

## 2013-02-22 NOTE — Assessment & Plan Note (Signed)
Recurrent breast cancer on the right.  S/p mastectomy and chemotherapy.  Now XRT.  Continues follow up with Dr Orlie Dakin.

## 2013-02-22 NOTE — Assessment & Plan Note (Signed)
Blood count followed through oncology.  Obtain records.  Colonoscopy 03/01/12 - diverticulosis and internal hemorrhoids.

## 2013-02-25 DIAGNOSIS — C50919 Malignant neoplasm of unspecified site of unspecified female breast: Secondary | ICD-10-CM | POA: Diagnosis not present

## 2013-02-27 ENCOUNTER — Other Ambulatory Visit: Payer: Self-pay | Admitting: Internal Medicine

## 2013-02-27 DIAGNOSIS — I10 Essential (primary) hypertension: Secondary | ICD-10-CM

## 2013-02-27 NOTE — Telephone Encounter (Signed)
Please advise? I wanted to check with you first and make you have sure that it was ok to fill this?

## 2013-02-27 NOTE — Telephone Encounter (Signed)
She was off for a while.  Had started back on it when i saw her.  Need to know if she had her sodium checked.Marland Kitchen  i need to see this to make sure her sodium is ok and then can get filled

## 2013-02-27 NOTE — Telephone Encounter (Signed)
Pt states her pharmacy told her that her medication had been stopped.  Pt needs Dr. Lorin Picket to confirm with the pharmacy that she does still need hydrochlorothiazide 25 mg. [facility] in [place].

## 2013-02-28 LAB — CBC CANCER CENTER
Basophil #: 0.1 x10 3/mm (ref 0.0–0.1)
Eosinophil %: 9.2 %
HCT: 33.2 % — ABNORMAL LOW (ref 35.0–47.0)
HGB: 11.2 g/dL — ABNORMAL LOW (ref 12.0–16.0)
Lymphocyte #: 0.8 x10 3/mm — ABNORMAL LOW (ref 1.0–3.6)
MCH: 29.6 pg (ref 26.0–34.0)
MCHC: 33.8 g/dL (ref 32.0–36.0)
Monocyte #: 0.6 x10 3/mm (ref 0.2–0.9)
Monocyte %: 13 %
Neutrophil #: 2.8 x10 3/mm (ref 1.4–6.5)
Platelet: 206 x10 3/mm (ref 150–440)
RBC: 3.79 10*6/uL — ABNORMAL LOW (ref 3.80–5.20)
RDW: 15 % — ABNORMAL HIGH (ref 11.5–14.5)

## 2013-03-04 DIAGNOSIS — C50919 Malignant neoplasm of unspecified site of unspecified female breast: Secondary | ICD-10-CM | POA: Diagnosis not present

## 2013-03-05 MED ORDER — HYDROCHLOROTHIAZIDE 25 MG PO TABS: 25.0000 mg | ORAL_TABLET | Freq: Every day | ORAL | Status: DC

## 2013-03-05 NOTE — Telephone Encounter (Signed)
Sent in to pharmacy.  

## 2013-03-05 NOTE — Addendum Note (Signed)
Addended by: Charm Barges on: 03/05/2013 12:14 PM   Modules accepted: Orders

## 2013-03-05 NOTE — Telephone Encounter (Signed)
I have seen her sodium lab and it was normal.  I am ok to refill her hctz.  Please let pt know that rx will be called in.  She will also need to come in for a follow up lab after she starts the medication.

## 2013-03-05 NOTE — Addendum Note (Signed)
Addended by: Marlene Lard on: 03/05/2013 12:16 PM   Modules accepted: Orders

## 2013-03-05 NOTE — Telephone Encounter (Signed)
Patient stated that she had labs done at [facility]. Will call and have labs faxed over. Explained to patient that we may need her to come in for labs before we can give her rx.

## 2013-03-07 LAB — CBC CANCER CENTER
Eosinophil %: 6.9 %
Lymphocyte #: 0.7 x10 3/mm — ABNORMAL LOW (ref 1.0–3.6)
Lymphocyte %: 9.6 %
MCV: 87 fL (ref 80–100)
Monocyte %: 12.3 %
Neutrophil #: 4.8 x10 3/mm (ref 1.4–6.5)
Neutrophil %: 70.6 %
Platelet: 196 x10 3/mm (ref 150–440)
RDW: 15.2 % — ABNORMAL HIGH (ref 11.5–14.5)
WBC: 6.8 x10 3/mm (ref 3.6–11.0)

## 2013-03-11 DIAGNOSIS — C50919 Malignant neoplasm of unspecified site of unspecified female breast: Secondary | ICD-10-CM | POA: Diagnosis not present

## 2013-03-14 DIAGNOSIS — C50919 Malignant neoplasm of unspecified site of unspecified female breast: Secondary | ICD-10-CM | POA: Diagnosis not present

## 2013-03-14 LAB — CBC CANCER CENTER
Eosinophil #: 0.4 x10 3/mm (ref 0.0–0.7)
Eosinophil %: 7.7 %
HGB: 11.8 g/dL — ABNORMAL LOW (ref 12.0–16.0)
Lymphocyte #: 0.7 x10 3/mm — ABNORMAL LOW (ref 1.0–3.6)
Lymphocyte %: 12.5 %
MCHC: 33.3 g/dL (ref 32.0–36.0)
MCV: 86 fL (ref 80–100)
Monocyte %: 13.4 %
Neutrophil #: 3.5 x10 3/mm (ref 1.4–6.5)
Neutrophil %: 65.5 %
RBC: 4.1 10*6/uL (ref 3.80–5.20)
WBC: 5.3 x10 3/mm (ref 3.6–11.0)

## 2013-03-18 ENCOUNTER — Ambulatory Visit: Payer: Self-pay | Admitting: Radiation Oncology

## 2013-03-18 DIAGNOSIS — Z09 Encounter for follow-up examination after completed treatment for conditions other than malignant neoplasm: Secondary | ICD-10-CM | POA: Diagnosis not present

## 2013-03-18 DIAGNOSIS — C50919 Malignant neoplasm of unspecified site of unspecified female breast: Secondary | ICD-10-CM | POA: Diagnosis not present

## 2013-03-18 DIAGNOSIS — Z510 Encounter for antineoplastic radiation therapy: Secondary | ICD-10-CM | POA: Diagnosis not present

## 2013-03-21 LAB — COMPREHENSIVE METABOLIC PANEL
Albumin: 3.4 g/dL (ref 3.4–5.0)
Alkaline Phosphatase: 83 U/L (ref 50–136)
Anion Gap: 5 — ABNORMAL LOW (ref 7–16)
BUN: 11 mg/dL (ref 7–18)
Bilirubin,Total: 0.2 mg/dL (ref 0.2–1.0)
Co2: 32 mmol/L (ref 21–32)
Creatinine: 1.18 mg/dL (ref 0.60–1.30)
EGFR (African American): 52 — ABNORMAL LOW
Potassium: 4.3 mmol/L (ref 3.5–5.1)
SGOT(AST): 29 U/L (ref 15–37)
SGPT (ALT): 33 U/L (ref 12–78)
Total Protein: 7 g/dL (ref 6.4–8.2)

## 2013-03-21 LAB — CBC CANCER CENTER
Basophil %: 1.1 %
Eosinophil #: 0.4 x10 3/mm (ref 0.0–0.7)
Eosinophil %: 7.4 %
Lymphocyte %: 9 %
MCH: 28.1 pg (ref 26.0–34.0)
MCV: 86 fL (ref 80–100)
RBC: 4.09 10*6/uL (ref 3.80–5.20)

## 2013-03-25 DIAGNOSIS — C50919 Malignant neoplasm of unspecified site of unspecified female breast: Secondary | ICD-10-CM | POA: Diagnosis not present

## 2013-04-14 DIAGNOSIS — C50919 Malignant neoplasm of unspecified site of unspecified female breast: Secondary | ICD-10-CM | POA: Diagnosis not present

## 2013-04-15 ENCOUNTER — Other Ambulatory Visit: Payer: Self-pay | Admitting: *Deleted

## 2013-04-15 MED ORDER — FLUOXETINE HCL 10 MG PO CAPS: 10.0000 mg | ORAL_CAPSULE | Freq: Every day | ORAL | Status: DC

## 2013-04-17 ENCOUNTER — Ambulatory Visit: Payer: Self-pay | Admitting: Oncology

## 2013-04-17 ENCOUNTER — Ambulatory Visit: Payer: Self-pay | Admitting: Radiation Oncology

## 2013-04-21 ENCOUNTER — Other Ambulatory Visit: Payer: Self-pay | Admitting: Pain Medicine

## 2013-04-21 ENCOUNTER — Ambulatory Visit: Payer: Self-pay | Admitting: Pain Medicine

## 2013-04-21 DIAGNOSIS — M79609 Pain in unspecified limb: Secondary | ICD-10-CM | POA: Diagnosis not present

## 2013-04-21 DIAGNOSIS — G894 Chronic pain syndrome: Secondary | ICD-10-CM | POA: Diagnosis not present

## 2013-04-21 DIAGNOSIS — M47817 Spondylosis without myelopathy or radiculopathy, lumbosacral region: Secondary | ICD-10-CM | POA: Diagnosis not present

## 2013-04-21 DIAGNOSIS — M545 Low back pain: Secondary | ICD-10-CM | POA: Diagnosis not present

## 2013-04-21 DIAGNOSIS — Z79899 Other long term (current) drug therapy: Secondary | ICD-10-CM | POA: Diagnosis not present

## 2013-04-21 DIAGNOSIS — IMO0002 Reserved for concepts with insufficient information to code with codable children: Secondary | ICD-10-CM | POA: Diagnosis not present

## 2013-04-21 LAB — MAGNESIUM: Magnesium: 1.8 mg/dL

## 2013-04-28 ENCOUNTER — Ambulatory Visit: Payer: Self-pay | Admitting: Pain Medicine

## 2013-04-28 DIAGNOSIS — M5126 Other intervertebral disc displacement, lumbar region: Secondary | ICD-10-CM | POA: Diagnosis not present

## 2013-04-28 DIAGNOSIS — M47817 Spondylosis without myelopathy or radiculopathy, lumbosacral region: Secondary | ICD-10-CM | POA: Diagnosis not present

## 2013-05-01 ENCOUNTER — Ambulatory Visit: Payer: Self-pay | Admitting: Pain Medicine

## 2013-05-01 DIAGNOSIS — M47817 Spondylosis without myelopathy or radiculopathy, lumbosacral region: Secondary | ICD-10-CM | POA: Diagnosis not present

## 2013-05-01 DIAGNOSIS — IMO0002 Reserved for concepts with insufficient information to code with codable children: Secondary | ICD-10-CM | POA: Diagnosis not present

## 2013-05-02 ENCOUNTER — Other Ambulatory Visit: Payer: Self-pay | Admitting: *Deleted

## 2013-05-02 NOTE — Telephone Encounter (Signed)
If she is still seeing the other MD then they need to prescribe.

## 2013-05-02 NOTE — Telephone Encounter (Signed)
Patient has an upcoming appointment scheduled for June but need a refill on Mobic, other doctor gave her prescription for Celebrex but insurance will not pay for it.

## 2013-05-02 NOTE — Telephone Encounter (Signed)
If she is still seeing the other md - then needs to get them to fill.

## 2013-05-05 MED ORDER — MELOXICAM 7.5 MG PO TABS: 7.5000 mg | ORAL_TABLET | Freq: Every day | ORAL | Status: DC

## 2013-05-06 NOTE — Telephone Encounter (Signed)
If she is seeing another physician currently that prescribed celebrex, then she needs to let them know that her insurance does not cover the celebrex and they will change to mobic.  If a problem, let me know.

## 2013-05-06 NOTE — Telephone Encounter (Signed)
Pt advised & states that she will see them in June and have them change it to [facility]

## 2013-05-22 DIAGNOSIS — C50419 Malignant neoplasm of upper-outer quadrant of unspecified female breast: Secondary | ICD-10-CM | POA: Diagnosis not present

## 2013-05-26 ENCOUNTER — Ambulatory Visit: Payer: Self-pay | Admitting: Pain Medicine

## 2013-05-26 DIAGNOSIS — M79609 Pain in unspecified limb: Secondary | ICD-10-CM | POA: Diagnosis not present

## 2013-05-26 DIAGNOSIS — M461 Sacroiliitis, not elsewhere classified: Secondary | ICD-10-CM | POA: Diagnosis not present

## 2013-05-26 DIAGNOSIS — M4716 Other spondylosis with myelopathy, lumbar region: Secondary | ICD-10-CM | POA: Diagnosis not present

## 2013-05-26 DIAGNOSIS — Z79899 Other long term (current) drug therapy: Secondary | ICD-10-CM | POA: Diagnosis not present

## 2013-05-26 DIAGNOSIS — Z853 Personal history of malignant neoplasm of breast: Secondary | ICD-10-CM | POA: Diagnosis not present

## 2013-05-26 DIAGNOSIS — M47817 Spondylosis without myelopathy or radiculopathy, lumbosacral region: Secondary | ICD-10-CM | POA: Diagnosis not present

## 2013-05-26 DIAGNOSIS — I10 Essential (primary) hypertension: Secondary | ICD-10-CM | POA: Diagnosis not present

## 2013-05-26 DIAGNOSIS — G8929 Other chronic pain: Secondary | ICD-10-CM | POA: Diagnosis not present

## 2013-05-26 DIAGNOSIS — M5137 Other intervertebral disc degeneration, lumbosacral region: Secondary | ICD-10-CM | POA: Diagnosis not present

## 2013-05-26 DIAGNOSIS — K449 Diaphragmatic hernia without obstruction or gangrene: Secondary | ICD-10-CM | POA: Diagnosis not present

## 2013-05-26 DIAGNOSIS — K219 Gastro-esophageal reflux disease without esophagitis: Secondary | ICD-10-CM | POA: Diagnosis not present

## 2013-05-26 DIAGNOSIS — IMO0001 Reserved for inherently not codable concepts without codable children: Secondary | ICD-10-CM | POA: Diagnosis not present

## 2013-05-26 DIAGNOSIS — IMO0002 Reserved for concepts with insufficient information to code with codable children: Secondary | ICD-10-CM | POA: Diagnosis not present

## 2013-05-26 DIAGNOSIS — M545 Low back pain: Secondary | ICD-10-CM | POA: Diagnosis not present

## 2013-05-27 ENCOUNTER — Encounter: Payer: Self-pay | Admitting: Internal Medicine

## 2013-05-27 ENCOUNTER — Ambulatory Visit (INDEPENDENT_AMBULATORY_CARE_PROVIDER_SITE_OTHER): Payer: Medicare Other | Admitting: Internal Medicine

## 2013-05-27 VITALS — BP 130/90 | HR 63 | Temp 98.8°F | Ht 63.16 in | Wt 185.5 lb

## 2013-05-27 DIAGNOSIS — N9489 Other specified conditions associated with female genital organs and menstrual cycle: Secondary | ICD-10-CM | POA: Diagnosis not present

## 2013-05-27 DIAGNOSIS — N390 Urinary tract infection, site not specified: Secondary | ICD-10-CM

## 2013-05-27 DIAGNOSIS — I10 Essential (primary) hypertension: Secondary | ICD-10-CM | POA: Diagnosis not present

## 2013-05-27 DIAGNOSIS — R300 Dysuria: Secondary | ICD-10-CM

## 2013-05-27 DIAGNOSIS — E785 Hyperlipidemia, unspecified: Secondary | ICD-10-CM | POA: Diagnosis not present

## 2013-05-27 DIAGNOSIS — D649 Anemia, unspecified: Secondary | ICD-10-CM

## 2013-05-27 DIAGNOSIS — C50919 Malignant neoplasm of unspecified site of unspecified female breast: Secondary | ICD-10-CM

## 2013-05-27 DIAGNOSIS — N949 Unspecified condition associated with female genital organs and menstrual cycle: Secondary | ICD-10-CM

## 2013-05-27 DIAGNOSIS — C50911 Malignant neoplasm of unspecified site of right female breast: Secondary | ICD-10-CM

## 2013-05-27 MED ORDER — METOPROLOL SUCCINATE ER 25 MG PO TB24: 25.0000 mg | ORAL_TABLET | Freq: Two times a day (BID) | ORAL | Status: DC

## 2013-05-27 MED ORDER — LOSARTAN POTASSIUM 100 MG PO TABS: 100.0000 mg | ORAL_TABLET | Freq: Every day | ORAL | Status: DC

## 2013-05-28 ENCOUNTER — Encounter: Payer: Self-pay | Admitting: Internal Medicine

## 2013-05-28 LAB — URINALYSIS, ROUTINE W REFLEX MICROSCOPIC
Bilirubin Urine: NEGATIVE
Hgb urine dipstick: NEGATIVE
Nitrite: NEGATIVE
Urobilinogen, UA: 0.2 (ref 0.0–1.0)
WBC, UA: NONE SEEN (ref 0–?)

## 2013-05-28 LAB — WET PREP BY MOLECULAR PROBE
Candida species: NEGATIVE
Gardnerella vaginalis: NEGATIVE

## 2013-05-28 MED ORDER — METOPROLOL SUCCINATE ER 25 MG PO TB24: 25.0000 mg | ORAL_TABLET | Freq: Two times a day (BID) | ORAL | Status: DC

## 2013-05-28 NOTE — Assessment & Plan Note (Signed)
Low cholesterol diet.  Follow lipid panel.  On no cholesterol medicine now.   

## 2013-05-28 NOTE — Assessment & Plan Note (Signed)
Blood count followed through oncology.  Colonoscopy 03/01/12 - diverticulosis and internal hemorrhoids.

## 2013-05-28 NOTE — Assessment & Plan Note (Signed)
Blood pressure has been under reasonable control.  Same medication regimen.  She is back on HCTZ now.  Follow metabolic panel.

## 2013-05-28 NOTE — Progress Notes (Signed)
Subjective:    Patient ID: Wanda Martinez, female    DOB: 1935-07-01, 78 y.o.   MRN: [id number]  HPI 78 year old female with past history of recurrent breast cancer s/p mastectomy, hypertension, hyperlipidemia and alopecia who comes in today for a scheduled follow up.  She was recently diagnosed with recurrent breast cancer on the right.  Is s/p right mastectomy.  Did have one lymph node positive.  Received chemotherapy and XRT.  Eating better now.  No nausea.  Breathing stable.  Off evista.  Bowels moving. Noticed some burning in her vaginal area - when she was receiving chemo.  Still some burning.  Also has some nasal congestion and throat congestion.  No fever.  No sob.  Blood pressure has been varying - mostly averaging 130's/80s at home.  Being seen in pain clinic for her chronic back pain.  On magnesium.    Past Medical History  Diagnosis Date  . Breast cancer     Masectomy - left - 1986   . GERD (gastroesophageal reflux disease)   . Hiatal hernia   . HTN (hypertension)   . Chronic cystitis   . Urinary incontinence     mixed   . Arthritis   . Anemia     Current Outpatient Prescriptions on File Prior to Visit  Medication Sig Dispense Refill  . aspirin 81 MG EC tablet Take 81 mg by mouth daily as needed.        . Calcium Carbonate (CALCIUM 600) 1500 MG TABS Take 1 tablet by mouth daily.        . fish oil-omega-3 fatty acids 1000 MG capsule Take 2 g by mouth 2 (two) times daily.      Marland Kitchen FLUoxetine (PROZAC) 10 MG capsule Take 1 capsule (10 mg total) by mouth daily.  30 capsule  2  . Glucosamine-Chondroit-Vit C-Mn (GLUCOSAMINE CHONDROITIN COMPLX) CAPS Take 2 capsules by mouth daily.        . hydrochlorothiazide (HYDRODIURIL) 25 MG tablet Take 1 tablet (25 mg total) by mouth daily.  30 tablet  5  . HYDROcodone-acetaminophen (NORCO) 5-325 MG per tablet Take 0.5-1 tablets by mouth daily.        . meloxicam (MOBIC) 7.5 MG tablet Take 1 tablet (7.5 mg total) by mouth daily.  30 tablet  0  .  Multiple Vitamin (MULTIVITAMIN) tablet Take 1 tablet by mouth daily.      Marland Kitchen omeprazole (PRILOSEC) 20 MG capsule Take 20 mg by mouth daily.         No current facility-administered medications on file prior to visit.    Review of Systems Patient denies any headache, lightheadedness or dizziness.  No significant sinus problems currently.  Does have some throat and nasal congestion.  No chest pain, tightness or palpitations.  No increased shortness of breath or cough.  Breathing stable.   No nausea or vomiting.  Some persistent abdominal discomfort.  Appetite better.  No bowel change, such as diarrhea, constipation, BRBPR or melana.  Burning as outlined.  Blood pressure as outlined.         Objective:   Physical Exam  Filed Vitals:   05/27/13 1428  BP: 130/90  Pulse: 63  Temp: 98.8 F (37.1 C)   Blood pressure recheck:  21/3  78 year old female in no acute distress.   HEENT:  Nares- clear.  Oropharynx - without lesions. NECK:  Supple.  Nontender.  No audible bruit.  HEART:  Appears to be regular. LUNGS:  No crackles or wheezing audible.  Respirations even and unlabored.  RADIAL PULSE:  Equal bilaterally.   ABDOMEN:  Soft, nontender.  Bowel sounds present and normal.  No audible abdominal bruit.  GU:  Normal external genitalia.  Vaginal vault without lesions.  Atrophy changes present.  Could not appreciate any adnexal masses or tenderness.    EXTREMITIES:  No increased edema present.  DP pulses palpable and equal bilaterally.      LYMPH NODES:  Palpable nodule - right axillary region.        Assessment & Plan:  QUESTION OF DYSURIA.  Will check urinalysis to confirm no infection.  ALLERGIES.  Saline flushes and Flonase as directed.  Mucinex/robitussin as directed.  Follow.     ABDOMINAL PAIN.  Previous pelvic ultrasound unrevealing.  CT unrevealing.  Colonoscopy 03/01/12 revealed diverticulosis and internal hemorrhoids.  Follow.   GU.  Vaginal exam as outlined.  Wet prep sent.   Some of the irritation probably related to atrophy.   PULMONARY.  Stable.    HEALTH MAINTENANCE.  Physical 10/16/12.  Colonoscopy as outlined.  Mammograms through oncology.

## 2013-05-28 NOTE — Assessment & Plan Note (Signed)
Recurrent breast cancer on the right.  S/p mastectomy and chemotherapy.  Now XRT.  Continues follow up with Dr Orlie Dakin.  Planning to have the nodule biopsied.  Continues to follow up with Dr Michela Pitcher.

## 2013-05-28 NOTE — Assessment & Plan Note (Signed)
Has seen Dr Achilles Dunk.  Will recheck urine today to confirm no infection.

## 2013-05-29 LAB — URINE CULTURE: Organism ID, Bacteria: NO GROWTH

## 2013-06-04 ENCOUNTER — Telehealth: Payer: Self-pay | Admitting: Internal Medicine

## 2013-06-04 MED ORDER — AMOXICILLIN 875 MG PO TABS: 875.0000 mg | ORAL_TABLET | Freq: Two times a day (BID) | ORAL | Status: DC

## 2013-06-04 NOTE — Telephone Encounter (Signed)
See previous note

## 2013-06-04 NOTE — Telephone Encounter (Signed)
Will discuss with Dr. Lorin Picket when she calls the office (out of office this week)

## 2013-06-04 NOTE — Telephone Encounter (Signed)
Patient Information:  Caller Name: Gwenneth  Phone: [phone]  Patient: Wanda Martinez, Wanda Martinez  Gender: Female  DOB: 1935-05-10  Age: 78 Years  PCP: Dale [facility]  Office Follow Up:  Does the office need to follow up with this patient?: Yes  Instructions For The Office: Patient declines appt. Patient is requesting a prescription. Patient uses [facility] in [place] at [phone]. Patient can be reached at [phone].  RN Note:  Patient states she developed cough, nasal and head congestion. Onset 05/25/13. Patient states she was seen in office 05/27/13 and advised to use Mucinex and saline nasal washes. Patient states no improvement and cough has increased. States occasional wheezing noted when laying down. Denies wheezing at present. States she is expectorating small amounts of yellow sputum. Patient taking fluids well. Patient states she has frequent coughing episodes. Care advice given per guidelines. Call back parameters reviewed. Patient verbalizes understanding. Patient declines appt. Patient is requesting a prescription. Patient uses [facility] in [place] at [phone]. Patient can be reached at [phone].  Symptoms  Reason For Call & Symptoms: Cough, congestion  Reviewed Health History In EMR: Yes  Reviewed Medications In EMR: Yes  Reviewed Allergies In EMR: Yes  Reviewed Surgeries / Procedures: Yes  Date of Onset of Symptoms: 05/25/2013  Treatments Tried: Mucinex, OTC Robitussin DM, Saline nasal washes  Treatments Tried Worked: No  Guideline(s) Used:  Colds  Cough  Disposition Per Guideline:   See Today or Tomorrow in Office  Reason For Disposition Reached:   Continuous (nonstop) coughing interferes with work or school and no improvement using cough treatment per Care Advice  Advice Given:  Reassurance  Coughing is the way that our lungs remove irritants and mucus. It helps protect our lungs from getting pneumonia.  You can get a dry  hacking cough after a chest cold. Sometimes this type of cough can last 1-3 weeks, and be worse at night.  You can also get a cough after being exposed to irritating substances like smoke, strong perfumes, and dust.  Cough Medicines:  Home Remedy - Honey: This old home remedy has been shown to help decrease coughing at night. The adult dosage is 2 teaspoons (10 ml) at bedtime. Honey should not be given to infants under one year of age.  Coughing Spasms:  Drink warm fluids. Inhale warm mist (Reason: both relax the airway and loosen up the phlegm).  Prevent Dehydration:  Drink adequate liquids.  This will help soothe an irritated or dry throat and loosen up the phlegm.  Avoid Tobacco Smoke:  Smoking or being exposed to smoke makes coughs much worse.  Expected Course:   Viral bronchitis (chest cold) causes a cough that lasts 1 to 3 weeks. Sometimes you may cough up lots of phlegm (sputum, mucus). The mucus can normally be white, gray, yellow, or green.  Call Back If:  Difficulty breathing  You become worse.  For a Stuffy Nose - Use Nasal Washes:  Introduction: Saline (salt water) nasal irrigation (nasal wash) is an effective and simple home remedy for treating stuffy nose and sinus congestion. The nose can be irrigated by pouring, spraying, or squirting salt water into the nose and then letting it run back out.  Patient Refused Recommendation:  Patient Requests Prescription  Patient declines appt; Patient is requesting a prescription to be called into [facility] at [phone]. Patient can be reached at [phone].

## 2013-06-04 NOTE — Telephone Encounter (Signed)
Per Dr. Lorin Picket, okay to call in Amoxicillin 875 BID x 10 days. Also advised pt to continue the Mucinex & Saline. Pt aware. Sent RX to [facility]

## 2013-06-10 ENCOUNTER — Ambulatory Visit: Payer: Self-pay | Admitting: Pain Medicine

## 2013-06-10 DIAGNOSIS — M47817 Spondylosis without myelopathy or radiculopathy, lumbosacral region: Secondary | ICD-10-CM | POA: Diagnosis not present

## 2013-06-10 DIAGNOSIS — M549 Dorsalgia, unspecified: Secondary | ICD-10-CM | POA: Diagnosis not present

## 2013-06-10 DIAGNOSIS — M5380 Other specified dorsopathies, site unspecified: Secondary | ICD-10-CM | POA: Diagnosis not present

## 2013-06-10 DIAGNOSIS — IMO0002 Reserved for concepts with insufficient information to code with codable children: Secondary | ICD-10-CM | POA: Diagnosis not present

## 2013-06-10 DIAGNOSIS — M25559 Pain in unspecified hip: Secondary | ICD-10-CM | POA: Diagnosis not present

## 2013-06-12 ENCOUNTER — Ambulatory Visit: Payer: Self-pay | Admitting: Surgery

## 2013-06-17 ENCOUNTER — Ambulatory Visit: Payer: Self-pay | Admitting: Surgery

## 2013-06-17 DIAGNOSIS — Z8249 Family history of ischemic heart disease and other diseases of the circulatory system: Secondary | ICD-10-CM | POA: Diagnosis not present

## 2013-06-17 DIAGNOSIS — N641 Fat necrosis of breast: Secondary | ICD-10-CM | POA: Diagnosis not present

## 2013-06-17 DIAGNOSIS — M1990 Unspecified osteoarthritis, unspecified site: Secondary | ICD-10-CM | POA: Diagnosis not present

## 2013-06-17 DIAGNOSIS — C50419 Malignant neoplasm of upper-outer quadrant of unspecified female breast: Secondary | ICD-10-CM | POA: Diagnosis not present

## 2013-06-17 DIAGNOSIS — Z79899 Other long term (current) drug therapy: Secondary | ICD-10-CM | POA: Diagnosis not present

## 2013-06-17 DIAGNOSIS — L988 Other specified disorders of the skin and subcutaneous tissue: Secondary | ICD-10-CM | POA: Diagnosis not present

## 2013-06-17 DIAGNOSIS — Z7982 Long term (current) use of aspirin: Secondary | ICD-10-CM | POA: Diagnosis not present

## 2013-06-17 DIAGNOSIS — Z823 Family history of stroke: Secondary | ICD-10-CM | POA: Diagnosis not present

## 2013-06-17 DIAGNOSIS — R222 Localized swelling, mass and lump, trunk: Secondary | ICD-10-CM | POA: Diagnosis not present

## 2013-06-18 ENCOUNTER — Other Ambulatory Visit: Payer: Self-pay | Admitting: Internal Medicine

## 2013-06-25 ENCOUNTER — Ambulatory Visit: Payer: Self-pay | Admitting: Pain Medicine

## 2013-06-25 DIAGNOSIS — M4716 Other spondylosis with myelopathy, lumbar region: Secondary | ICD-10-CM | POA: Diagnosis not present

## 2013-06-25 DIAGNOSIS — Z7982 Long term (current) use of aspirin: Secondary | ICD-10-CM | POA: Diagnosis not present

## 2013-06-25 DIAGNOSIS — Z853 Personal history of malignant neoplasm of breast: Secondary | ICD-10-CM | POA: Diagnosis not present

## 2013-06-25 DIAGNOSIS — G8929 Other chronic pain: Secondary | ICD-10-CM | POA: Diagnosis not present

## 2013-06-25 DIAGNOSIS — IMO0002 Reserved for concepts with insufficient information to code with codable children: Secondary | ICD-10-CM | POA: Diagnosis not present

## 2013-06-25 DIAGNOSIS — K219 Gastro-esophageal reflux disease without esophagitis: Secondary | ICD-10-CM | POA: Diagnosis not present

## 2013-06-25 DIAGNOSIS — M47817 Spondylosis without myelopathy or radiculopathy, lumbosacral region: Secondary | ICD-10-CM | POA: Diagnosis not present

## 2013-06-25 DIAGNOSIS — M545 Low back pain: Secondary | ICD-10-CM | POA: Diagnosis not present

## 2013-06-25 DIAGNOSIS — Z79899 Other long term (current) drug therapy: Secondary | ICD-10-CM | POA: Diagnosis not present

## 2013-06-25 DIAGNOSIS — M5137 Other intervertebral disc degeneration, lumbosacral region: Secondary | ICD-10-CM | POA: Diagnosis not present

## 2013-06-25 DIAGNOSIS — IMO0001 Reserved for inherently not codable concepts without codable children: Secondary | ICD-10-CM | POA: Diagnosis not present

## 2013-06-25 DIAGNOSIS — M79609 Pain in unspecified limb: Secondary | ICD-10-CM | POA: Diagnosis not present

## 2013-07-08 DIAGNOSIS — T8131XA Disruption of external operation (surgical) wound, not elsewhere classified, initial encounter: Secondary | ICD-10-CM | POA: Diagnosis not present

## 2013-07-09 ENCOUNTER — Ambulatory Visit: Payer: Self-pay | Admitting: Oncology

## 2013-07-24 ENCOUNTER — Encounter: Payer: Self-pay | Admitting: Internal Medicine

## 2013-08-01 ENCOUNTER — Telehealth: Payer: Self-pay | Admitting: *Deleted

## 2013-08-01 ENCOUNTER — Ambulatory Visit (INDEPENDENT_AMBULATORY_CARE_PROVIDER_SITE_OTHER): Payer: Medicare Other | Admitting: Internal Medicine

## 2013-08-01 VITALS — BP 130/80 | HR 57 | Temp 98.4°F | Ht 63.16 in | Wt 181.5 lb

## 2013-08-01 DIAGNOSIS — R259 Unspecified abnormal involuntary movements: Secondary | ICD-10-CM | POA: Diagnosis not present

## 2013-08-01 DIAGNOSIS — D510 Vitamin B12 deficiency anemia due to intrinsic factor deficiency: Secondary | ICD-10-CM | POA: Diagnosis not present

## 2013-08-01 DIAGNOSIS — E785 Hyperlipidemia, unspecified: Secondary | ICD-10-CM

## 2013-08-01 DIAGNOSIS — C50919 Malignant neoplasm of unspecified site of unspecified female breast: Secondary | ICD-10-CM

## 2013-08-01 DIAGNOSIS — C50911 Malignant neoplasm of unspecified site of right female breast: Secondary | ICD-10-CM

## 2013-08-01 DIAGNOSIS — R5381 Other malaise: Secondary | ICD-10-CM | POA: Diagnosis not present

## 2013-08-01 DIAGNOSIS — I10 Essential (primary) hypertension: Secondary | ICD-10-CM

## 2013-08-01 DIAGNOSIS — D649 Anemia, unspecified: Secondary | ICD-10-CM

## 2013-08-01 DIAGNOSIS — R251 Tremor, unspecified: Secondary | ICD-10-CM

## 2013-08-01 DIAGNOSIS — R29898 Other symptoms and signs involving the musculoskeletal system: Secondary | ICD-10-CM

## 2013-08-01 DIAGNOSIS — R5383 Other fatigue: Secondary | ICD-10-CM

## 2013-08-01 NOTE — Telephone Encounter (Signed)
i deleted labs to change resulting agent, when i try to put them back in it won't let me, can you try please

## 2013-08-01 NOTE — Telephone Encounter (Signed)
done

## 2013-08-02 LAB — CBC WITH DIFFERENTIAL/PLATELET
Basophils Absolute: 0 10*3/uL (ref 0.0–0.1)
Basophils Relative: 1 % (ref 0–1)
Hemoglobin: 11 g/dL — ABNORMAL LOW (ref 12.0–15.0)
Lymphocytes Relative: 23 % (ref 12–46)
MCHC: 33.4 g/dL (ref 30.0–36.0)
Monocytes Relative: 8 % (ref 3–12)
Neutro Abs: 4.4 10*3/uL (ref 1.7–7.7)
Neutrophils Relative %: 66 % (ref 43–77)
RDW: 17.2 % — ABNORMAL HIGH (ref 11.5–15.5)
WBC: 6.7 10*3/uL (ref 4.0–10.5)

## 2013-08-02 LAB — COMPREHENSIVE METABOLIC PANEL
ALT: 22 U/L (ref 0–35)
AST: 24 U/L (ref 0–37)
Albumin: 4.2 g/dL (ref 3.5–5.2)
Alkaline Phosphatase: 54 U/L (ref 39–117)
Potassium: 3.9 mEq/L (ref 3.5–5.3)
Sodium: 128 mEq/L — ABNORMAL LOW (ref 135–145)
Total Protein: 6.1 g/dL (ref 6.0–8.3)

## 2013-08-03 ENCOUNTER — Other Ambulatory Visit: Payer: Self-pay | Admitting: Internal Medicine

## 2013-08-03 ENCOUNTER — Telehealth: Payer: Self-pay | Admitting: Internal Medicine

## 2013-08-03 ENCOUNTER — Encounter: Payer: Self-pay | Admitting: Internal Medicine

## 2013-08-03 DIAGNOSIS — E871 Hypo-osmolality and hyponatremia: Secondary | ICD-10-CM

## 2013-08-03 DIAGNOSIS — D649 Anemia, unspecified: Secondary | ICD-10-CM

## 2013-08-03 DIAGNOSIS — R251 Tremor, unspecified: Secondary | ICD-10-CM | POA: Insufficient documentation

## 2013-08-03 NOTE — Telephone Encounter (Signed)
Pt notified of labs.  She was also notified need labs Monday 08/04/13.  Please put on lab schedule.  She is going to come by after her 1:15 appt with Dr Achilles Dunk.  Please put on schedule for 2:00.  Thanks.

## 2013-08-03 NOTE — Assessment & Plan Note (Signed)
Blood count followed through oncology.  Colonoscopy 03/01/12 - diverticulosis and internal hemorrhoids.  Recheck cbc today.

## 2013-08-03 NOTE — Assessment & Plan Note (Signed)
Low cholesterol diet.  Follow lipid panel.  On no cholesterol medicine now.   

## 2013-08-03 NOTE — Assessment & Plan Note (Signed)
Recurrent breast cancer on the right.  S/p mastectomy and chemotherapy and XRT.  Continues follow up with Dr Orlie Dakin.  Had recent breast biopsy - negative.  Saw Dr Michela Pitcher.

## 2013-08-03 NOTE — Progress Notes (Signed)
Orders placed for f/u labs.  

## 2013-08-03 NOTE — Assessment & Plan Note (Signed)
Blood pressure has been under reasonable control.   Elevated today.   She is on hctz.  Check metabolic panel.

## 2013-08-03 NOTE — Assessment & Plan Note (Signed)
New onset, persistent tremor.  Weakness.  Feels her legs and arms are not able to relax.  Question of cog wheeling.  Finger to nose intact.  Will check routine labs and B12.  Schedule MRI.  Also neurology referral for evaluation and further treatment recommendations.  Pt comfortable with this plan.

## 2013-08-03 NOTE — Progress Notes (Signed)
Subjective:    Patient ID: Wanda Martinez, female    DOB: Nov 14, 1935, 78 y.o.   MRN: [id number]  HPI 78 year old female with past history of recurrent breast cancer s/p mastectomy, hypertension, hyperlipidemia and alopecia who comes in today for a scheduled follow up.  She was recently diagnosed with recurrent breast cancer on the right.  Is s/p right mastectomy.  Did have one lymph node positive.  Received chemotherapy and XRT.  Had a recent biopsy with removal.  Tolerated well.  Saw Dr Michela Pitcher.  He removed her sutures.  States she is eating and drinking.  No nausea.  Breathing stable.  Off evista.  Bowels moving.  She feel two weeks ago.  [place] Seat of her front porch.  Landed in a bush.  Injured her lower back.  Is better now.  No headache.  Does report feeling weak.  Also reports increased tremors.  Mouth quivering.  Legs feel they do not relax.  Arms not relaxing.  This has occurred over the last few weeks.     Past Medical History  Diagnosis Date  . Breast cancer     Masectomy - left - 1986   . GERD (gastroesophageal reflux disease)   . Hiatal hernia   . HTN (hypertension)   . Chronic cystitis   . Urinary incontinence     mixed   . Arthritis   . Anemia     Current Outpatient Prescriptions on File Prior to Visit  Medication Sig Dispense Refill  . aspirin 81 MG EC tablet Take 81 mg by mouth daily as needed.        . Calcium Carbonate (CALCIUM 600) 1500 MG TABS Take 1 tablet by mouth daily.        . fish oil-omega-3 fatty acids 1000 MG capsule Take 2 g by mouth 2 (two) times daily.      Marland Kitchen FLUoxetine (PROZAC) 10 MG capsule TAKE (1) CAPSULE BY MOUTH ONCE DAILY.  30 capsule  2  . Glucosamine-Chondroit-Vit C-Mn (GLUCOSAMINE CHONDROITIN COMPLX) CAPS Take 2 capsules by mouth daily.        . hydrochlorothiazide (HYDRODIURIL) 25 MG tablet Take 1 tablet (25 mg total) by mouth daily.  30 tablet  5  . HYDROcodone-acetaminophen (NORCO) 5-325 MG per tablet Take 0.5-1 tablets by mouth daily.        Marland Kitchen  losartan (COZAAR) 100 MG tablet Take 1 tablet (100 mg total) by mouth daily.  90 tablet  3  . meloxicam (MOBIC) 7.5 MG tablet Take 1 tablet (7.5 mg total) by mouth daily.  30 tablet  0  . metoprolol succinate (TOPROL-XL) 25 MG 24 hr tablet Take 1 tablet (25 mg total) by mouth 2 (two) times daily.  180 tablet  3  . Multiple Vitamin (MULTIVITAMIN) tablet Take 1 tablet by mouth daily.      Marland Kitchen omeprazole (PRILOSEC) 20 MG capsule TAKE (1) CAPSULE BY MOUTH TWICE DAILY.  180 capsule  1   No current facility-administered medications on file prior to visit.    Review of Systems Patient denies any headache. No significant dizziness.  Does report feeling weak.  No significant sinus problems currently.  No chest pain, tightness or palpitations.  No increased shortness of breath or cough.  Breathing stable.   No nausea or vomiting.    Eating and drinking.   No bowel change, such as diarrhea, constipation, BRBPR or melana.  Tremors and quivering as outlined.           Objective:  Physical Exam  Filed Vitals:   08/01/13 1425  BP: 130/80  Pulse: 57  Temp: 98.4 F (36.9 C)   Blood pressure recheck:  86-36/31  78 year old female in no acute distress.   HEENT:  Nares- clear.  Oropharynx - without lesions. NECK:  Supple.  Nontender.  No audible bruit.  HEART:  Appears to be regular. LUNGS:  No crackles or wheezing audible.  Respirations even and unlabored.  RADIAL PULSE:  Equal bilaterally.   ABDOMEN:  Soft, nontender.  Bowel sounds present and normal.  No audible abdominal bruit.     EXTREMITIES:  No increased edema present.  DP pulses palpable and equal bilaterally.      NEURO:  Tremors bilateral hands.  Finger to nose - normal.         Assessment & Plan:  ABDOMINAL PAIN.  Previous pelvic ultrasound unrevealing.  CT unrevealing.  Colonoscopy 03/01/12 revealed diverticulosis and internal hemorrhoids.  Follow.  Not reported as an issue today.   PULMONARY.  Stable.    HEALTH MAINTENANCE.   Physical 10/16/12.  Colonoscopy as outlined.  Mammograms through oncology.

## 2013-08-04 ENCOUNTER — Other Ambulatory Visit (INDEPENDENT_AMBULATORY_CARE_PROVIDER_SITE_OTHER): Payer: Medicare Other

## 2013-08-04 ENCOUNTER — Telehealth: Payer: Self-pay | Admitting: *Deleted

## 2013-08-04 DIAGNOSIS — E871 Hypo-osmolality and hyponatremia: Secondary | ICD-10-CM

## 2013-08-04 DIAGNOSIS — E7800 Pure hypercholesterolemia, unspecified: Secondary | ICD-10-CM | POA: Diagnosis not present

## 2013-08-04 DIAGNOSIS — D649 Anemia, unspecified: Secondary | ICD-10-CM

## 2013-08-04 LAB — CBC WITH DIFFERENTIAL/PLATELET
Basophils Relative: 0.8 % (ref 0.0–3.0)
Eosinophils Relative: 2.7 % (ref 0.0–5.0)
Hemoglobin: 12.2 g/dL (ref 12.0–15.0)
Lymphocytes Relative: 27.3 % (ref 12.0–46.0)
MCHC: 34.1 g/dL (ref 30.0–36.0)
Monocytes Relative: 9.6 % (ref 3.0–12.0)
Neutro Abs: 2.8 10*3/uL (ref 1.4–7.7)
RBC: 4.08 Mil/uL (ref 3.87–5.11)
WBC: 4.7 10*3/uL (ref 4.5–10.5)

## 2013-08-04 LAB — BASIC METABOLIC PANEL
CO2: 26 mEq/L (ref 19–32)
Calcium: 9.6 mg/dL (ref 8.4–10.5)
GFR: 68.22 mL/min (ref >60.00)
Sodium: 134 mEq/L — ABNORMAL LOW (ref 135–145)

## 2013-08-04 LAB — LIPID PANEL
Cholesterol: 212 mg/dL — ABNORMAL HIGH (ref 0–200)
HDL: 41.3 mg/dL (ref >39.00)
Triglycerides: 153 mg/dL — ABNORMAL HIGH (ref 0.0–149.0)

## 2013-08-04 LAB — IBC PANEL
Iron: 82 ug/dL (ref 42–145)
Saturation Ratios: 25.6 % (ref 20.0–50.0)
Transferrin: 229 mg/dL (ref 212.0–360.0)

## 2013-08-04 NOTE — Telephone Encounter (Signed)
Pt would like to add cholesterol to her labs?

## 2013-08-04 NOTE — Telephone Encounter (Signed)
Ordered added.

## 2013-08-04 NOTE — Addendum Note (Signed)
Addended by: Montine Circle D on: 08/04/2013 11:14 AM   Modules accepted: Orders

## 2013-08-06 ENCOUNTER — Ambulatory Visit: Payer: Self-pay | Admitting: Internal Medicine

## 2013-08-06 ENCOUNTER — Ambulatory Visit: Payer: Self-pay | Admitting: Oncology

## 2013-08-06 ENCOUNTER — Encounter: Payer: Self-pay | Admitting: Internal Medicine

## 2013-08-06 ENCOUNTER — Encounter: Payer: Self-pay | Admitting: *Deleted

## 2013-08-06 DIAGNOSIS — R29898 Other symptoms and signs involving the musculoskeletal system: Secondary | ICD-10-CM | POA: Diagnosis not present

## 2013-08-06 DIAGNOSIS — R259 Unspecified abnormal involuntary movements: Secondary | ICD-10-CM | POA: Diagnosis not present

## 2013-08-06 DIAGNOSIS — Z1389 Encounter for screening for other disorder: Secondary | ICD-10-CM | POA: Diagnosis not present

## 2013-08-06 LAB — CREATININE, SERUM
Creatinine: 0.95 mg/dL (ref 0.60–1.30)
EGFR (African American): >60

## 2013-08-07 ENCOUNTER — Ambulatory Visit: Payer: Self-pay | Admitting: Oncology

## 2013-08-07 ENCOUNTER — Other Ambulatory Visit: Payer: Medicare Other

## 2013-08-07 DIAGNOSIS — Z9221 Personal history of antineoplastic chemotherapy: Secondary | ICD-10-CM | POA: Diagnosis not present

## 2013-08-07 DIAGNOSIS — Z79899 Other long term (current) drug therapy: Secondary | ICD-10-CM | POA: Diagnosis not present

## 2013-08-07 DIAGNOSIS — Z170 Estrogen receptor positive status [ER+]: Secondary | ICD-10-CM | POA: Diagnosis not present

## 2013-08-07 DIAGNOSIS — Z79811 Long term (current) use of aromatase inhibitors: Secondary | ICD-10-CM | POA: Diagnosis not present

## 2013-08-07 DIAGNOSIS — Z923 Personal history of irradiation: Secondary | ICD-10-CM | POA: Diagnosis not present

## 2013-08-07 DIAGNOSIS — R259 Unspecified abnormal involuntary movements: Secondary | ICD-10-CM | POA: Diagnosis not present

## 2013-08-07 DIAGNOSIS — C50919 Malignant neoplasm of unspecified site of unspecified female breast: Secondary | ICD-10-CM | POA: Diagnosis not present

## 2013-08-07 DIAGNOSIS — Z9010 Acquired absence of unspecified breast and nipple: Secondary | ICD-10-CM | POA: Diagnosis not present

## 2013-08-07 DIAGNOSIS — Z7982 Long term (current) use of aspirin: Secondary | ICD-10-CM | POA: Diagnosis not present

## 2013-08-08 ENCOUNTER — Ambulatory Visit: Payer: Self-pay | Admitting: Pain Medicine

## 2013-08-08 DIAGNOSIS — G8929 Other chronic pain: Secondary | ICD-10-CM | POA: Diagnosis not present

## 2013-08-08 DIAGNOSIS — M47817 Spondylosis without myelopathy or radiculopathy, lumbosacral region: Secondary | ICD-10-CM | POA: Diagnosis not present

## 2013-08-08 DIAGNOSIS — K219 Gastro-esophageal reflux disease without esophagitis: Secondary | ICD-10-CM | POA: Diagnosis not present

## 2013-08-08 DIAGNOSIS — M5137 Other intervertebral disc degeneration, lumbosacral region: Secondary | ICD-10-CM | POA: Diagnosis not present

## 2013-08-08 DIAGNOSIS — M4716 Other spondylosis with myelopathy, lumbar region: Secondary | ICD-10-CM | POA: Diagnosis not present

## 2013-08-08 DIAGNOSIS — IMO0002 Reserved for concepts with insufficient information to code with codable children: Secondary | ICD-10-CM | POA: Diagnosis not present

## 2013-08-08 DIAGNOSIS — M79609 Pain in unspecified limb: Secondary | ICD-10-CM | POA: Diagnosis not present

## 2013-08-08 DIAGNOSIS — I10 Essential (primary) hypertension: Secondary | ICD-10-CM | POA: Diagnosis not present

## 2013-08-08 DIAGNOSIS — M461 Sacroiliitis, not elsewhere classified: Secondary | ICD-10-CM | POA: Diagnosis not present

## 2013-08-08 DIAGNOSIS — Z79899 Other long term (current) drug therapy: Secondary | ICD-10-CM | POA: Diagnosis not present

## 2013-08-08 DIAGNOSIS — Z853 Personal history of malignant neoplasm of breast: Secondary | ICD-10-CM | POA: Diagnosis not present

## 2013-08-08 DIAGNOSIS — K449 Diaphragmatic hernia without obstruction or gangrene: Secondary | ICD-10-CM | POA: Diagnosis not present

## 2013-08-08 DIAGNOSIS — M545 Low back pain: Secondary | ICD-10-CM | POA: Diagnosis not present

## 2013-08-08 DIAGNOSIS — F329 Major depressive disorder, single episode, unspecified: Secondary | ICD-10-CM | POA: Diagnosis not present

## 2013-08-11 DIAGNOSIS — T8131XA Disruption of external operation (surgical) wound, not elsewhere classified, initial encounter: Secondary | ICD-10-CM | POA: Diagnosis not present

## 2013-08-12 ENCOUNTER — Telehealth: Payer: Self-pay | Admitting: *Deleted

## 2013-08-12 DIAGNOSIS — F329 Major depressive disorder, single episode, unspecified: Secondary | ICD-10-CM | POA: Diagnosis not present

## 2013-08-12 DIAGNOSIS — I10 Essential (primary) hypertension: Secondary | ICD-10-CM | POA: Diagnosis not present

## 2013-08-12 DIAGNOSIS — E669 Obesity, unspecified: Secondary | ICD-10-CM | POA: Diagnosis not present

## 2013-08-12 DIAGNOSIS — R259 Unspecified abnormal involuntary movements: Secondary | ICD-10-CM | POA: Diagnosis not present

## 2013-08-12 NOTE — Telephone Encounter (Signed)
LMTCB re: MRI Brain results from 08/06/13- MRI revealed no acute abnormality. Dr. Lorin Picket still recommends a neurology eval for tremor and severe weakness that was also noted on scan. Also wants pt to keep scheduled appointment

## 2013-08-12 NOTE — Telephone Encounter (Signed)
Pt notified of MRI results. She saw Dr. Malvin Johns today & he doesn't feel that it is [place] but feels it is a results of her previous treatments

## 2013-08-18 ENCOUNTER — Ambulatory Visit: Payer: Self-pay | Admitting: Oncology

## 2013-08-18 DIAGNOSIS — Z79899 Other long term (current) drug therapy: Secondary | ICD-10-CM | POA: Diagnosis not present

## 2013-08-18 DIAGNOSIS — M899 Disorder of bone, unspecified: Secondary | ICD-10-CM | POA: Diagnosis not present

## 2013-08-18 DIAGNOSIS — Z13820 Encounter for screening for osteoporosis: Secondary | ICD-10-CM | POA: Diagnosis not present

## 2013-08-19 DIAGNOSIS — N816 Rectocele: Secondary | ICD-10-CM | POA: Insufficient documentation

## 2013-08-19 DIAGNOSIS — N183 Chronic kidney disease, stage 3 unspecified: Secondary | ICD-10-CM | POA: Insufficient documentation

## 2013-08-19 DIAGNOSIS — N1832 Chronic kidney disease, stage 3b: Secondary | ICD-10-CM | POA: Insufficient documentation

## 2013-08-19 DIAGNOSIS — N8111 Cystocele, midline: Secondary | ICD-10-CM | POA: Insufficient documentation

## 2013-08-19 DIAGNOSIS — N182 Chronic kidney disease, stage 2 (mild): Secondary | ICD-10-CM | POA: Insufficient documentation

## 2013-08-19 DIAGNOSIS — N3946 Mixed incontinence: Secondary | ICD-10-CM | POA: Insufficient documentation

## 2013-08-19 DIAGNOSIS — N3020 Other chronic cystitis without hematuria: Secondary | ICD-10-CM | POA: Insufficient documentation

## 2013-08-19 DIAGNOSIS — R339 Retention of urine, unspecified: Secondary | ICD-10-CM | POA: Insufficient documentation

## 2013-08-19 HISTORY — DX: Cystocele, midline: N81.11

## 2013-08-21 DIAGNOSIS — M899 Disorder of bone, unspecified: Secondary | ICD-10-CM | POA: Diagnosis not present

## 2013-08-28 DIAGNOSIS — I10 Essential (primary) hypertension: Secondary | ICD-10-CM | POA: Diagnosis not present

## 2013-08-28 DIAGNOSIS — E669 Obesity, unspecified: Secondary | ICD-10-CM | POA: Diagnosis not present

## 2013-08-28 DIAGNOSIS — F329 Major depressive disorder, single episode, unspecified: Secondary | ICD-10-CM | POA: Diagnosis not present

## 2013-08-28 DIAGNOSIS — R259 Unspecified abnormal involuntary movements: Secondary | ICD-10-CM | POA: Diagnosis not present

## 2013-09-03 ENCOUNTER — Encounter: Payer: Self-pay | Admitting: Internal Medicine

## 2013-09-15 ENCOUNTER — Ambulatory Visit (INDEPENDENT_AMBULATORY_CARE_PROVIDER_SITE_OTHER): Payer: Medicare Other | Admitting: Internal Medicine

## 2013-09-15 ENCOUNTER — Encounter: Payer: Self-pay | Admitting: Internal Medicine

## 2013-09-15 VITALS — BP 120/90 | HR 65 | Temp 98.4°F | Ht 63.16 in | Wt 181.0 lb

## 2013-09-15 DIAGNOSIS — R259 Unspecified abnormal involuntary movements: Secondary | ICD-10-CM | POA: Diagnosis not present

## 2013-09-15 DIAGNOSIS — N390 Urinary tract infection, site not specified: Secondary | ICD-10-CM

## 2013-09-15 DIAGNOSIS — I10 Essential (primary) hypertension: Secondary | ICD-10-CM | POA: Diagnosis not present

## 2013-09-15 DIAGNOSIS — R251 Tremor, unspecified: Secondary | ICD-10-CM

## 2013-09-15 DIAGNOSIS — E785 Hyperlipidemia, unspecified: Secondary | ICD-10-CM | POA: Diagnosis not present

## 2013-09-15 DIAGNOSIS — C50919 Malignant neoplasm of unspecified site of unspecified female breast: Secondary | ICD-10-CM

## 2013-09-15 DIAGNOSIS — D649 Anemia, unspecified: Secondary | ICD-10-CM

## 2013-09-15 LAB — POCT URINALYSIS DIPSTICK
Bilirubin, UA: NEGATIVE
Glucose, UA: NEGATIVE
Ketones, UA: NEGATIVE
Protein, UA: NEGATIVE
Spec Grav, UA: 1.015
Urobilinogen, UA: 0.2
pH, UA: 7

## 2013-09-15 MED ORDER — MOMETASONE FUROATE 50 MCG/ACT NA SUSP: 2.0000 | Freq: Every day | NASAL | Status: DC

## 2013-09-15 MED ORDER — CIPROFLOXACIN HCL 250 MG PO TABS: 250.0000 mg | ORAL_TABLET | Freq: Two times a day (BID) | ORAL | Status: DC

## 2013-09-16 ENCOUNTER — Encounter: Payer: Self-pay | Admitting: Internal Medicine

## 2013-09-16 NOTE — Progress Notes (Signed)
Subjective:    Patient ID: Wanda Martinez, female    DOB: 06-30-1935, 78 y.o.   MRN: [id number]  Urinary Frequency  Associated symptoms include frequency.  78 year old female with past history of recurrent breast cancer s/p mastectomy, hypertension, hyperlipidemia and alopecia who comes in today for a scheduled follow up.  She was recently diagnosed with recurrent breast cancer on the right.  Is s/p right mastectomy.  Did have one lymph node positive.  Received chemotherapy and XRT.  Had a recent biopsy with removal.  Tolerated well.  Saw Dr Michela Pitcher.  He removed her sutures.  States she is eating and drinking well.  No nausea.  Breathing stable.  Off evista.  Bowels moving.  Was having increased trembling and shaking.  See last note for details.  Saw neurology.  See Dr Daisy Blossom note for details.  On neurontin.  She feels some better.  She is having increased urinary frequency.  Wet on herself on one occasion.  Minimal dysuria.  Taking cranberry pills and azo.  Some lower abdominal pressure and back pressure.  Better after starting azo.     Past Medical History  Diagnosis Date  . Breast cancer     Masectomy - left - 1986   . GERD (gastroesophageal reflux disease)   . Hiatal hernia   . HTN (hypertension)   . Chronic cystitis   . Urinary incontinence     mixed   . Arthritis   . Anemia     Current Outpatient Prescriptions on File Prior to Visit  Medication Sig Dispense Refill  . aspirin 81 MG EC tablet Take 81 mg by mouth daily as needed.        . Calcium Carbonate (CALCIUM 600) 1500 MG TABS Take 1 tablet by mouth daily.        . fish oil-omega-3 fatty acids 1000 MG capsule Take 2 g by mouth 2 (two) times daily.      Marland Kitchen FLUoxetine (PROZAC) 10 MG capsule TAKE (1) CAPSULE BY MOUTH ONCE DAILY.  30 capsule  2  . Glucosamine-Chondroit-Vit C-Mn (GLUCOSAMINE CHONDROITIN COMPLX) CAPS Take 2 capsules by mouth daily.        . hydrochlorothiazide (HYDRODIURIL) 25 MG tablet Take 1 tablet (25 mg total) by  mouth daily.  30 tablet  5  . HYDROcodone-acetaminophen (NORCO) 5-325 MG per tablet Take 0.5-1 tablets by mouth daily.        Marland Kitchen losartan (COZAAR) 100 MG tablet Take 1 tablet (100 mg total) by mouth daily.  90 tablet  3  . meloxicam (MOBIC) 7.5 MG tablet Take 1 tablet (7.5 mg total) by mouth daily.  30 tablet  0  . metoprolol succinate (TOPROL-XL) 25 MG 24 hr tablet Take 1 tablet (25 mg total) by mouth 2 (two) times daily.  180 tablet  3  . Multiple Vitamin (MULTIVITAMIN) tablet Take 1 tablet by mouth daily.      Marland Kitchen omeprazole (PRILOSEC) 20 MG capsule TAKE (1) CAPSULE BY MOUTH TWICE DAILY.  180 capsule  1  . traZODone (DESYREL) 50 MG tablet Take 50 mg by mouth at bedtime.       No current facility-administered medications on file prior to visit.    Review of Systems  Genitourinary: Positive for frequency.  Patient denies any headache. No dizziness reported.   Energy better.  No significant sinus problems currently.  No chest pain, tightness or palpitations.  No increased shortness of breath or cough.  Breathing stable.   No nausea or  vomiting.    Eating and drinking.   No bowel change, such as diarrhea, constipation, BRBPR or melana.  Tremors and quivering improved.  Urinary symptoms as outlined.  Blood pressures on outside checks averaging 124-144/70-80s.          Objective:   Physical Exam  Filed Vitals:   09/15/13 1448  BP: 120/90  Pulse: 65  Temp: 98.4 F (36.9 C)   Blood pressure recheck:  90/9  78 year old female in no acute distress.   HEENT:  Nares- clear.  Oropharynx - without lesions. NECK:  Supple.  Nontender.  No audible bruit.  HEART:  Appears to be regular. LUNGS:  No crackles or wheezing audible.  Respirations even and unlabored.  RADIAL PULSE:  Equal bilaterally.   ABDOMEN:  Soft.  No significant tenderness to palpation.  Minimal lower abdominal tenderness.  Bowel sounds present and normal.  No audible abdominal bruit.     EXTREMITIES:  No increased edema present.   DP pulses palpable and equal bilaterally.         Assessment & Plan:  UTI.  Symptoms as outlined.  Urine dip with wbc's.  Treat with cipro 250mg  bid x 5 days.  Follow.  Send for culture.    ABDOMINAL PAIN.  Previous pelvic ultrasound unrevealing.  CT unrevealing.  Colonoscopy 03/01/12 revealed diverticulosis and internal hemorrhoids.  Follow.  Stable.  Treat uti.    PULMONARY.  Stable.    HEALTH MAINTENANCE.  Physical 10/16/12.  Colonoscopy as outlined.  Mammograms through oncology.

## 2013-09-16 NOTE — Assessment & Plan Note (Signed)
Symptoms as outlined.  Urine dip - wbc's.  Treat with cipro as outlined.

## 2013-09-16 NOTE — Assessment & Plan Note (Signed)
Blood pressure as oultined.  Her checks appear to be under reasonable control.  Follow pressures.  Follow metabolic panel.

## 2013-09-16 NOTE — Assessment & Plan Note (Signed)
Saw Dr Malvin Johns.  See his note for details.  On neurontin.  Better.  Question if some intolerance to increased dose of neurontin.  She plans to discuss with Dr Malvin Johns.

## 2013-09-16 NOTE — Assessment & Plan Note (Signed)
Blood count followed through oncology.  Colonoscopy 03/01/12 - diverticulosis and internal hemorrhoids.  Follow cbc.   

## 2013-09-16 NOTE — Assessment & Plan Note (Signed)
Low cholesterol diet.  Follow lipid panel.  On no cholesterol medicine now.   

## 2013-09-16 NOTE — Assessment & Plan Note (Signed)
Recurrent breast cancer on the right.  S/p mastectomy and chemotherapy and XRT.  Continues follow up with Dr Orlie Dakin.  Had recent breast biopsy - negative.  Saw Dr Michela Pitcher.

## 2013-09-17 ENCOUNTER — Ambulatory Visit: Payer: Self-pay | Admitting: Oncology

## 2013-09-17 LAB — URINE CULTURE

## 2013-09-22 DIAGNOSIS — Z23 Encounter for immunization: Secondary | ICD-10-CM | POA: Diagnosis not present

## 2013-10-07 DIAGNOSIS — H269 Unspecified cataract: Secondary | ICD-10-CM | POA: Diagnosis not present

## 2013-10-28 ENCOUNTER — Ambulatory Visit: Payer: Self-pay | Admitting: Radiation Oncology

## 2013-10-28 ENCOUNTER — Ambulatory Visit (INDEPENDENT_AMBULATORY_CARE_PROVIDER_SITE_OTHER)
Admission: RE | Admit: 2013-10-28 | Discharge: 2013-10-28 | Disposition: A | Discharge status: Home / Self Care | Payer: Medicare Other | Source: Ambulatory Visit | Attending: Internal Medicine | Admitting: Internal Medicine

## 2013-10-28 ENCOUNTER — Ambulatory Visit (INDEPENDENT_AMBULATORY_CARE_PROVIDER_SITE_OTHER): Payer: Medicare Other | Admitting: Internal Medicine

## 2013-10-28 ENCOUNTER — Encounter: Payer: Self-pay | Admitting: Internal Medicine

## 2013-10-28 VITALS — BP 130/90 | HR 61 | Temp 98.4°F | Ht 64.0 in | Wt 183.0 lb

## 2013-10-28 DIAGNOSIS — M549 Dorsalgia, unspecified: Secondary | ICD-10-CM

## 2013-10-28 DIAGNOSIS — C50919 Malignant neoplasm of unspecified site of unspecified female breast: Secondary | ICD-10-CM | POA: Diagnosis not present

## 2013-10-28 DIAGNOSIS — I10 Essential (primary) hypertension: Secondary | ICD-10-CM

## 2013-10-28 DIAGNOSIS — E871 Hypo-osmolality and hyponatremia: Secondary | ICD-10-CM | POA: Diagnosis not present

## 2013-10-28 DIAGNOSIS — R251 Tremor, unspecified: Secondary | ICD-10-CM

## 2013-10-28 DIAGNOSIS — Z170 Estrogen receptor positive status [ER+]: Secondary | ICD-10-CM | POA: Diagnosis not present

## 2013-10-28 DIAGNOSIS — Z79899 Other long term (current) drug therapy: Secondary | ICD-10-CM | POA: Diagnosis not present

## 2013-10-28 DIAGNOSIS — R51 Headache: Secondary | ICD-10-CM | POA: Diagnosis not present

## 2013-10-28 DIAGNOSIS — M542 Cervicalgia: Secondary | ICD-10-CM

## 2013-10-28 DIAGNOSIS — Z09 Encounter for follow-up examination after completed treatment for conditions other than malignant neoplasm: Secondary | ICD-10-CM | POA: Diagnosis not present

## 2013-10-28 DIAGNOSIS — E785 Hyperlipidemia, unspecified: Secondary | ICD-10-CM

## 2013-10-28 DIAGNOSIS — Z79811 Long term (current) use of aromatase inhibitors: Secondary | ICD-10-CM | POA: Diagnosis not present

## 2013-10-28 DIAGNOSIS — N390 Urinary tract infection, site not specified: Secondary | ICD-10-CM

## 2013-10-28 DIAGNOSIS — Z9221 Personal history of antineoplastic chemotherapy: Secondary | ICD-10-CM | POA: Diagnosis not present

## 2013-10-28 DIAGNOSIS — Z9010 Acquired absence of unspecified breast and nipple: Secondary | ICD-10-CM | POA: Diagnosis not present

## 2013-10-28 DIAGNOSIS — R259 Unspecified abnormal involuntary movements: Secondary | ICD-10-CM

## 2013-10-28 DIAGNOSIS — Z7982 Long term (current) use of aspirin: Secondary | ICD-10-CM | POA: Diagnosis not present

## 2013-10-28 DIAGNOSIS — M5030 Other cervical disc degeneration, unspecified cervical region: Secondary | ICD-10-CM | POA: Diagnosis not present

## 2013-10-28 DIAGNOSIS — D649 Anemia, unspecified: Secondary | ICD-10-CM

## 2013-10-28 DIAGNOSIS — Z923 Personal history of irradiation: Secondary | ICD-10-CM | POA: Diagnosis not present

## 2013-10-28 DIAGNOSIS — IMO0002 Reserved for concepts with insufficient information to code with codable children: Secondary | ICD-10-CM | POA: Diagnosis not present

## 2013-10-28 LAB — BASIC METABOLIC PANEL
CO2: 28 mEq/L (ref 19–32)
Calcium: 9.6 mg/dL (ref 8.4–10.5)
Chloride: 96 mEq/L (ref 96–112)
Creatinine, Ser: 0.9 mg/dL (ref 0.4–1.2)
Glucose, Bld: 89 mg/dL (ref 70–99)

## 2013-10-28 MED ORDER — MELOXICAM 7.5 MG PO TABS: 7.5000 mg | ORAL_TABLET | Freq: Every day | ORAL | Status: DC

## 2013-10-28 MED ORDER — FLUOXETINE HCL 10 MG PO CAPS: 10.0000 mg | ORAL_CAPSULE | Freq: Every day | ORAL | Status: DC

## 2013-10-28 NOTE — Assessment & Plan Note (Addendum)
Blood pressure as oultined.  Her checks appear to be elevated.  Restart hctz 12.5mg  q day.   Follow pressures.  Follow metabolic panel.  Get her back in soon to reassess.

## 2013-10-28 NOTE — Progress Notes (Signed)
Subjective:    Patient ID: Wanda Martinez, female    DOB: [birth year], 78 y.o.   MRN: [id number]  HPI 78 year old female with past history of recurrent breast cancer s/p mastectomy, hypertension, hyperlipidemia and alopecia who comes in today to follow up on these issues as well as for a complete physical exam.  She was recently diagnosed with recurrent breast cancer on the right.  Is s/p right mastectomy.  Did have one lymph node positive.  Received chemotherapy and XRT.  States she is eating and drinking.  No nausea.  Breathing stable.  Off evista.  Bowels moving.   Was having increased tremors.  Saw Dr Malvin Johns. Placed on neurontin.  She feels she is grinding her teeth at night.  Mouth feels tight.  Plans to discuss with Dr Malvin Johns.  Overall feels better.  Has f/u with Dr Aggie Cosier tomorrow.  Sees Dr Orlie Dakin next month.  Due f/u mammogram with her next appt with him.  She is having some increased pain in her neck and shoulders (right greater).  Already on meloxicam.      Past Medical History  Diagnosis Date  . Breast cancer     Masectomy - left - 1986   . GERD (gastroesophageal reflux disease)   . Hiatal hernia   . HTN (hypertension)   . Chronic cystitis   . Urinary incontinence     mixed   . Arthritis   . Anemia     Current Outpatient Prescriptions on File Prior to Visit  Medication Sig Dispense Refill  . aspirin 81 MG EC tablet Take 81 mg by mouth daily as needed.        . Calcium Carbonate (CALCIUM 600) 1500 MG TABS Take 1 tablet by mouth daily.        . fish oil-omega-3 fatty acids 1000 MG capsule Take 2 g by mouth 2 (two) times daily.      Marland Kitchen FLUoxetine (PROZAC) 10 MG capsule TAKE (1) CAPSULE BY MOUTH ONCE DAILY.  30 capsule  2  . gabapentin (NEURONTIN) 300 MG capsule Take 300 mg by mouth 2 (two) times daily.      . Glucosamine-Chondroit-Vit C-Mn (GLUCOSAMINE CHONDROITIN COMPLX) CAPS Take 2 capsules by mouth daily.        . hydrochlorothiazide (HYDRODIURIL) 25 MG tablet Take 1 tablet  (25 mg total) by mouth daily.  30 tablet  5  . HYDROcodone-acetaminophen (NORCO) 5-325 MG per tablet Take 0.5-1 tablets by mouth daily.        Marland Kitchen losartan (COZAAR) 100 MG tablet Take 1 tablet (100 mg total) by mouth daily.  90 tablet  3  . meloxicam (MOBIC) 7.5 MG tablet Take 1 tablet (7.5 mg total) by mouth daily.  30 tablet  0  . metoprolol succinate (TOPROL-XL) 25 MG 24 hr tablet Take 1 tablet (25 mg total) by mouth 2 (two) times daily.  180 tablet  3  . Multiple Vitamin (MULTIVITAMIN) tablet Take 1 tablet by mouth daily.      Marland Kitchen omeprazole (PRILOSEC) 20 MG capsule TAKE (1) CAPSULE BY MOUTH TWICE DAILY.  180 capsule  1   No current facility-administered medications on file prior to visit.    Review of Systems Patient denies any headache. No significant dizziness.   No significant sinus problems currently.  No chest pain, tightness or palpitations.  No increased shortness of breath or cough.  Breathing stable.   No nausea or vomiting.    Eating and drinking.   No bowel change,  such as diarrhea, constipation, BRBPR or melana.  Tremors as outlined.  Feels better.  Increased neck and shoulder pain as outlined.  Handling stress relatively well.  Blood pressures have been elevated.            Objective:   Physical Exam  Filed Vitals:   10/28/13 1324  BP: 130/90  Pulse: 61  Temp: 98.4 F (36.9 C)   Blood pressure recheck:  36-61/63  78 year old female in no acute distress.   HEENT:  Nares- clear.  Oropharynx - without lesions. NECK:  Supple.  Nontender.  No audible bruit.  HEART:  Appears to be regular. LUNGS:  No crackles or wheezing audible.  Respirations even and unlabored.  RADIAL PULSE:  Equal bilaterally.    BREASTS:  S/p left mastectomy.  No nipple discharge or nipple retraction present.  Could not appreciate any distinct nodules or axillary adenopathy.  Well healed incision sites.   ABDOMEN:  Soft, nontender.  Bowel sounds present and normal.  No audible abdominal bruit.  GU:   Not performed.     EXTREMITIES:  No increased edema present.  DP pulses palpable and equal bilaterally.          Assessment & Plan:  ABDOMINAL PAIN.  Previous pelvic ultrasound unrevealing.  CT unrevealing.  Colonoscopy 03/01/12 revealed diverticulosis and internal hemorrhoids.  Follow.  Not reported as an issue today.   PULMONARY.  Stable.    HEALTH MAINTENANCE.  Physical today. .  Colonoscopy as outlined.  Mammograms through oncology.   I spent 40 minutes with the patient and more than 50% of the time was spent in consultation regarding the above.

## 2013-10-28 NOTE — Assessment & Plan Note (Signed)
Low cholesterol diet.  Follow lipid panel.  On no cholesterol medicine now.   

## 2013-10-28 NOTE — Progress Notes (Signed)
Pre-visit discussion using our clinic review tool. No additional management support is needed unless otherwise documented below in the visit note.  

## 2013-10-29 DIAGNOSIS — Z79811 Long term (current) use of aromatase inhibitors: Secondary | ICD-10-CM | POA: Diagnosis not present

## 2013-10-29 DIAGNOSIS — R51 Headache: Secondary | ICD-10-CM | POA: Diagnosis not present

## 2013-10-29 DIAGNOSIS — C50919 Malignant neoplasm of unspecified site of unspecified female breast: Secondary | ICD-10-CM | POA: Diagnosis not present

## 2013-10-29 DIAGNOSIS — Z09 Encounter for follow-up examination after completed treatment for conditions other than malignant neoplasm: Secondary | ICD-10-CM | POA: Diagnosis not present

## 2013-10-29 DIAGNOSIS — Z170 Estrogen receptor positive status [ER+]: Secondary | ICD-10-CM | POA: Diagnosis not present

## 2013-10-29 DIAGNOSIS — R259 Unspecified abnormal involuntary movements: Secondary | ICD-10-CM | POA: Diagnosis not present

## 2013-11-01 ENCOUNTER — Encounter: Payer: Self-pay | Admitting: Internal Medicine

## 2013-11-01 DIAGNOSIS — M549 Dorsalgia, unspecified: Secondary | ICD-10-CM

## 2013-11-01 DIAGNOSIS — M542 Cervicalgia: Secondary | ICD-10-CM

## 2013-11-01 HISTORY — DX: Cervicalgia: M54.2

## 2013-11-01 HISTORY — DX: Dorsalgia, unspecified: M54.9

## 2013-11-01 NOTE — Assessment & Plan Note (Signed)
Neck pain.  Persistent.  On meloxicam.  Check C-spine xray.  Further w/up pending.

## 2013-11-01 NOTE — Assessment & Plan Note (Signed)
Recurrent breast cancer on the right.  S/p mastectomy and chemotherapy and XRT.  Continues follow up with Dr Orlie Dakin.  Had recent breast biopsy - negative.  Saw Dr Michela Pitcher.  Has follow up with Dr Orlie Dakin next month.

## 2013-11-01 NOTE — Assessment & Plan Note (Signed)
Upper back pain.  Check xray.  Further w/up pending.

## 2013-11-01 NOTE — Assessment & Plan Note (Signed)
Blood count followed through oncology.  Colonoscopy 03/01/12 - diverticulosis and internal hemorrhoids.  Follow cbc.   

## 2013-11-01 NOTE — Assessment & Plan Note (Addendum)
Saw Dr Malvin Johns.  See his note for details.  On neurontin.  Better.  Question if some side effects to the medication.  She plans to discuss with Dr Malvin Johns.

## 2013-11-01 NOTE — Assessment & Plan Note (Signed)
Asymptomatic.  Follow.   

## 2013-11-07 DIAGNOSIS — Z79811 Long term (current) use of aromatase inhibitors: Secondary | ICD-10-CM | POA: Diagnosis not present

## 2013-11-07 DIAGNOSIS — R259 Unspecified abnormal involuntary movements: Secondary | ICD-10-CM | POA: Diagnosis not present

## 2013-11-07 DIAGNOSIS — C50919 Malignant neoplasm of unspecified site of unspecified female breast: Secondary | ICD-10-CM | POA: Diagnosis not present

## 2013-11-07 DIAGNOSIS — R51 Headache: Secondary | ICD-10-CM | POA: Diagnosis not present

## 2013-11-17 ENCOUNTER — Ambulatory Visit: Payer: Self-pay | Admitting: Radiation Oncology

## 2013-12-05 DIAGNOSIS — M542 Cervicalgia: Secondary | ICD-10-CM | POA: Diagnosis not present

## 2013-12-05 DIAGNOSIS — R259 Unspecified abnormal involuntary movements: Secondary | ICD-10-CM | POA: Diagnosis not present

## 2013-12-05 DIAGNOSIS — E669 Obesity, unspecified: Secondary | ICD-10-CM | POA: Diagnosis not present

## 2013-12-05 DIAGNOSIS — F329 Major depressive disorder, single episode, unspecified: Secondary | ICD-10-CM | POA: Diagnosis not present

## 2013-12-22 ENCOUNTER — Encounter: Payer: Self-pay | Admitting: *Deleted

## 2013-12-22 ENCOUNTER — Ambulatory Visit: Payer: Self-pay | Admitting: Ophthalmology

## 2013-12-22 DIAGNOSIS — H2510 Age-related nuclear cataract, unspecified eye: Secondary | ICD-10-CM | POA: Diagnosis not present

## 2013-12-22 DIAGNOSIS — I10 Essential (primary) hypertension: Secondary | ICD-10-CM | POA: Diagnosis not present

## 2013-12-22 DIAGNOSIS — Z01812 Encounter for preprocedural laboratory examination: Secondary | ICD-10-CM | POA: Diagnosis not present

## 2013-12-22 LAB — POTASSIUM: POTASSIUM: 4.4 mmol/L (ref 3.5–5.1)

## 2013-12-23 ENCOUNTER — Encounter: Payer: Self-pay | Admitting: Internal Medicine

## 2013-12-23 ENCOUNTER — Other Ambulatory Visit: Payer: Self-pay | Admitting: Internal Medicine

## 2013-12-23 ENCOUNTER — Ambulatory Visit (INDEPENDENT_AMBULATORY_CARE_PROVIDER_SITE_OTHER): Payer: Medicare Other | Admitting: Internal Medicine

## 2013-12-23 ENCOUNTER — Encounter (INDEPENDENT_AMBULATORY_CARE_PROVIDER_SITE_OTHER): Payer: Self-pay

## 2013-12-23 VITALS — BP 110/70 | HR 59 | Temp 98.4°F | Ht 64.0 in | Wt 181.5 lb

## 2013-12-23 DIAGNOSIS — E871 Hypo-osmolality and hyponatremia: Secondary | ICD-10-CM | POA: Diagnosis not present

## 2013-12-23 DIAGNOSIS — M79609 Pain in unspecified limb: Secondary | ICD-10-CM | POA: Diagnosis not present

## 2013-12-23 DIAGNOSIS — C50919 Malignant neoplasm of unspecified site of unspecified female breast: Secondary | ICD-10-CM

## 2013-12-23 DIAGNOSIS — G20 Parkinson's disease: Secondary | ICD-10-CM | POA: Diagnosis not present

## 2013-12-23 DIAGNOSIS — N644 Mastodynia: Secondary | ICD-10-CM | POA: Diagnosis not present

## 2013-12-23 DIAGNOSIS — R251 Tremor, unspecified: Secondary | ICD-10-CM

## 2013-12-23 DIAGNOSIS — I10 Essential (primary) hypertension: Secondary | ICD-10-CM

## 2013-12-23 DIAGNOSIS — E785 Hyperlipidemia, unspecified: Secondary | ICD-10-CM

## 2013-12-23 DIAGNOSIS — R259 Unspecified abnormal involuntary movements: Secondary | ICD-10-CM

## 2013-12-23 DIAGNOSIS — E669 Obesity, unspecified: Secondary | ICD-10-CM | POA: Diagnosis not present

## 2013-12-23 DIAGNOSIS — D649 Anemia, unspecified: Secondary | ICD-10-CM

## 2013-12-23 DIAGNOSIS — F3289 Other specified depressive episodes: Secondary | ICD-10-CM | POA: Diagnosis not present

## 2013-12-23 LAB — HEPATIC FUNCTION PANEL
ALT: 8 U/L (ref 0–35)
AST: 29 U/L (ref 0–37)
Albumin: 4.3 g/dL (ref 3.5–5.2)
Alkaline Phosphatase: 58 U/L (ref 39–117)
BILIRUBIN TOTAL: 0.5 mg/dL (ref 0.3–1.2)
Bilirubin, Direct: 0.1 mg/dL (ref 0.0–0.3)
TOTAL PROTEIN: 7.4 g/dL (ref 6.0–8.3)

## 2013-12-23 LAB — CBC WITH DIFFERENTIAL/PLATELET
BASOS PCT: 0.4 % (ref 0.0–3.0)
Basophils Absolute: 0 10*3/uL (ref 0.0–0.1)
Eosinophils Absolute: 0.3 10*3/uL (ref 0.0–0.7)
Eosinophils Relative: 4.8 % (ref 0.0–5.0)
HCT: 37.3 % (ref 36.0–46.0)
HEMOGLOBIN: 12.4 g/dL (ref 12.0–15.0)
LYMPHS PCT: 24.1 % (ref 12.0–46.0)
Lymphs Abs: 1.7 10*3/uL (ref 0.7–4.0)
MCHC: 33.3 g/dL (ref 30.0–36.0)
MCV: 87.6 fl (ref 78.0–100.0)
Monocytes Absolute: 0.5 10*3/uL (ref 0.1–1.0)
Monocytes Relative: 7.3 % (ref 3.0–12.0)
Neutro Abs: 4.4 10*3/uL (ref 1.4–7.7)
Neutrophils Relative %: 63.4 % (ref 43.0–77.0)
Platelets: 243 10*3/uL (ref 150.0–400.0)
RBC: 4.26 Mil/uL (ref 3.87–5.11)
RDW: 13 % (ref 11.5–14.6)
WBC: 7 10*3/uL (ref 4.5–10.5)

## 2013-12-23 LAB — BASIC METABOLIC PANEL
BUN: 21 mg/dL (ref 6–23)
CHLORIDE: 96 meq/L (ref 96–112)
CO2: 29 meq/L (ref 19–32)
Calcium: 9.7 mg/dL (ref 8.4–10.5)
Creatinine, Ser: 1.1 mg/dL (ref 0.4–1.2)
GFR: 49.23 mL/min — ABNORMAL LOW (ref >60.00)
Glucose, Bld: 82 mg/dL (ref 70–99)
Potassium: 5 mEq/L (ref 3.5–5.1)
Sodium: 132 mEq/L — ABNORMAL LOW (ref 135–145)

## 2013-12-23 MED ORDER — HYDROCHLOROTHIAZIDE 25 MG PO TABS: 25.0000 mg | ORAL_TABLET | Freq: Every day | ORAL | Status: DC

## 2013-12-23 MED ORDER — MELOXICAM 7.5 MG PO TABS: 7.5000 mg | ORAL_TABLET | Freq: Every day | ORAL | Status: DC

## 2013-12-23 NOTE — Progress Notes (Signed)
Order placed for f/u lab.   

## 2013-12-23 NOTE — Progress Notes (Signed)
Pre-visit discussion using our clinic review tool. No additional management support is needed unless otherwise documented below in the visit note.  

## 2013-12-24 ENCOUNTER — Encounter: Payer: Self-pay | Admitting: *Deleted

## 2013-12-28 ENCOUNTER — Encounter: Payer: Self-pay | Admitting: Internal Medicine

## 2013-12-28 NOTE — Assessment & Plan Note (Signed)
Blood pressure as oultined.  Back on HCTZ.  Better.  Check metabolic panel.  Follow sodium.   

## 2013-12-28 NOTE — Assessment & Plan Note (Signed)
Blood count followed through oncology.  Colonoscopy 03/01/12 - diverticulosis and internal hemorrhoids.  Follow cbc.   

## 2013-12-28 NOTE — Assessment & Plan Note (Signed)
Low cholesterol diet.  Follow lipid panel.  On no cholesterol medicine now.   

## 2013-12-28 NOTE — Progress Notes (Signed)
Subjective:    Patient ID: Wanda Martinez, female    DOB: Nov 07, 1935, 79 y.o.   MRN: [id number]  HPI 79 year old female with past history of recurrent breast cancer s/p mastectomy, hypertension, hyperlipidemia and alopecia who comes in today for a scheduled follow up.  She was recently diagnosed with recurrent breast cancer on the right.  Is s/p right mastectomy.  Did have one lymph node positive.  Received chemotherapy and XRT.  States she is eating and drinking.  No nausea.  Breathing stable.  Off evista.  Bowels moving.   Was having increased tremors.  Saw Dr Melrose Nakayama.  Originally placed on neurontin.  Had f/u.  Was started on sinemet.   Overall feels better.  She feels this may be helping.  She does report persistent soreness in her right breast.  No distinct nodule.  Some right shoulder pain.  She also has noticed a palpable sore area on her left lower leg.  No increased redness or swelling.  No known injury.       Past Medical History  Diagnosis Date  . Breast cancer     Masectomy - left - 1986   . GERD (gastroesophageal reflux disease)   . Hiatal hernia   . HTN (hypertension)   . Chronic cystitis   . Urinary incontinence     mixed   . Arthritis   . Anemia     Current Outpatient Prescriptions on File Prior to Visit  Medication Sig Dispense Refill  . aspirin 81 MG EC tablet Take 81 mg by mouth daily as needed.        . Calcium Carbonate (CALCIUM 600) 1500 MG TABS Take 1 tablet by mouth daily.        . fish oil-omega-3 fatty acids 1000 MG capsule Take 2 g by mouth 2 (two) times daily.      Marland Kitchen gabapentin (NEURONTIN) 300 MG capsule Take 300 mg by mouth 2 (two) times daily.      . Glucosamine-Chondroit-Vit C-Mn (GLUCOSAMINE CHONDROITIN COMPLX) CAPS Take 2 capsules by mouth daily.        Marland Kitchen HYDROcodone-acetaminophen (NORCO) 5-325 MG per tablet Take 0.5-1 tablets by mouth daily.        Marland Kitchen losartan (COZAAR) 100 MG tablet Take 1 tablet (100 mg total) by mouth daily.  90 tablet  3  . metoprolol  succinate (TOPROL-XL) 25 MG 24 hr tablet Take 1 tablet (25 mg total) by mouth 2 (two) times daily.  180 tablet  3  . Multiple Vitamin (MULTIVITAMIN) tablet Take 1 tablet by mouth daily.      Marland Kitchen omeprazole (PRILOSEC) 20 MG capsule TAKE (1) CAPSULE BY MOUTH TWICE DAILY.  180 capsule  1   No current facility-administered medications on file prior to visit.    Review of Systems Patient denies any headache. No significant dizziness.   No significant sinus problems currently.  No chest pain, tightness or palpitations.  No increased shortness of breath or cough.  Breathing stable.   No nausea or vomiting.    Eating and drinking.   No bowel change, such as diarrhea, constipation, BRBPR or melana.  Tremors as outlined.  Doing some better since starting sinemet.   Feels better.  Some right shoulder pain as outlined.  Handling stress relatively well.  Blood pressures has been better.   Some soreness right breast as outlined.             Objective:   Physical Exam  Filed Vitals:  12/23/13 1134  BP: 110/70  Pulse: 59  Temp: 98.4 F (36.9 C)   Blood pressure recheck:  6/31  79 year old female in no acute distress.   HEENT:  Nares- clear.  Oropharynx - without lesions. NECK:  Supple.  Nontender.  No audible bruit.  HEART:  Appears to be regular. LUNGS:  No crackles or wheezing audible.  Respirations even and unlabored.  RADIAL PULSE:  Equal bilaterally.    BREASTS:  S/p left mastectomy.  No nipple discharge or nipple retraction present.  Could not appreciate any distinct nodules or axillary adenopathy.  Well healed incision sites.  Increased soreness to palpation right.  No rash.  No increased erythema.    ABDOMEN:  Soft, nontender.  Bowel sounds present and normal.  No audible abdominal bruit.    EXTREMITIES:  No increased edema present.  DP pulses palpable and equal bilaterally.  Palpable area left medial lower leg.  No increased erythema or warmth.       Assessment & Plan:  ABDOMINAL PAIN.   Previous pelvic ultrasound unrevealing.  CT unrevealing.  Colonoscopy 03/01/12 revealed diverticulosis and internal hemorrhoids.  Follow.  Not reported as an issue today.   PULMONARY.  Stable.    HEALTH MAINTENANCE.  Physical 10/28/13. .  Colonoscopy as outlined.  Mammograms through oncology.   I spent 25 minutes with the patient and more than 50% of the time was spent in consultation regarding the above.

## 2013-12-28 NOTE — Assessment & Plan Note (Signed)
S/p mastectomy and chemotherapy and XRT.  Recurrent breast cancer - right.  Continues follow up with Dr Grayland Ormond.  Had recent breast biopsy - negative.  Saw Dr Pat Patrick.  Now with soreness right side.  Discussed further w/up including possible bone scan.  Will have Dr Pat Patrick evaluate.

## 2013-12-28 NOTE — Assessment & Plan Note (Signed)
Saw Dr Potter.  See his note for details.  On neurontin.  Was started on Sinemet.  Better.  Follow.  Keep f/u appt with Dr Potter.   

## 2013-12-29 ENCOUNTER — Ambulatory Visit: Payer: Self-pay | Admitting: Ophthalmology

## 2013-12-29 DIAGNOSIS — I498 Other specified cardiac arrhythmias: Secondary | ICD-10-CM | POA: Diagnosis not present

## 2013-12-29 DIAGNOSIS — Z79899 Other long term (current) drug therapy: Secondary | ICD-10-CM | POA: Diagnosis not present

## 2013-12-29 DIAGNOSIS — R011 Cardiac murmur, unspecified: Secondary | ICD-10-CM | POA: Diagnosis not present

## 2013-12-29 DIAGNOSIS — K219 Gastro-esophageal reflux disease without esophagitis: Secondary | ICD-10-CM | POA: Diagnosis not present

## 2013-12-29 DIAGNOSIS — H269 Unspecified cataract: Secondary | ICD-10-CM | POA: Diagnosis not present

## 2013-12-29 DIAGNOSIS — Z978 Presence of other specified devices: Secondary | ICD-10-CM | POA: Diagnosis not present

## 2013-12-29 DIAGNOSIS — Z853 Personal history of malignant neoplasm of breast: Secondary | ICD-10-CM | POA: Diagnosis not present

## 2013-12-29 DIAGNOSIS — H2510 Age-related nuclear cataract, unspecified eye: Secondary | ICD-10-CM | POA: Diagnosis not present

## 2013-12-29 DIAGNOSIS — Z7982 Long term (current) use of aspirin: Secondary | ICD-10-CM | POA: Diagnosis not present

## 2013-12-29 DIAGNOSIS — M62838 Other muscle spasm: Secondary | ICD-10-CM | POA: Diagnosis not present

## 2013-12-29 DIAGNOSIS — I440 Atrioventricular block, first degree: Secondary | ICD-10-CM | POA: Diagnosis not present

## 2013-12-29 DIAGNOSIS — M549 Dorsalgia, unspecified: Secondary | ICD-10-CM | POA: Diagnosis not present

## 2013-12-29 DIAGNOSIS — F411 Generalized anxiety disorder: Secondary | ICD-10-CM | POA: Diagnosis not present

## 2013-12-29 DIAGNOSIS — I10 Essential (primary) hypertension: Secondary | ICD-10-CM | POA: Diagnosis not present

## 2013-12-31 DIAGNOSIS — C50419 Malignant neoplasm of upper-outer quadrant of unspecified female breast: Secondary | ICD-10-CM | POA: Diagnosis not present

## 2014-01-09 ENCOUNTER — Ambulatory Visit: Payer: Self-pay | Admitting: Pain Medicine

## 2014-01-09 DIAGNOSIS — M545 Low back pain, unspecified: Secondary | ICD-10-CM | POA: Diagnosis not present

## 2014-01-09 DIAGNOSIS — M79609 Pain in unspecified limb: Secondary | ICD-10-CM | POA: Diagnosis not present

## 2014-01-09 DIAGNOSIS — G894 Chronic pain syndrome: Secondary | ICD-10-CM | POA: Diagnosis not present

## 2014-01-09 DIAGNOSIS — M5137 Other intervertebral disc degeneration, lumbosacral region: Secondary | ICD-10-CM | POA: Diagnosis not present

## 2014-01-09 DIAGNOSIS — K219 Gastro-esophageal reflux disease without esophagitis: Secondary | ICD-10-CM | POA: Diagnosis not present

## 2014-01-09 DIAGNOSIS — M5030 Other cervical disc degeneration, unspecified cervical region: Secondary | ICD-10-CM | POA: Diagnosis not present

## 2014-01-09 DIAGNOSIS — Z853 Personal history of malignant neoplasm of breast: Secondary | ICD-10-CM | POA: Diagnosis not present

## 2014-01-09 DIAGNOSIS — M4712 Other spondylosis with myelopathy, cervical region: Secondary | ICD-10-CM | POA: Diagnosis not present

## 2014-01-09 DIAGNOSIS — I10 Essential (primary) hypertension: Secondary | ICD-10-CM | POA: Diagnosis not present

## 2014-01-09 DIAGNOSIS — Z5181 Encounter for therapeutic drug level monitoring: Secondary | ICD-10-CM | POA: Diagnosis not present

## 2014-01-09 DIAGNOSIS — IMO0002 Reserved for concepts with insufficient information to code with codable children: Secondary | ICD-10-CM | POA: Diagnosis not present

## 2014-01-09 DIAGNOSIS — F3289 Other specified depressive episodes: Secondary | ICD-10-CM | POA: Diagnosis not present

## 2014-01-09 DIAGNOSIS — M461 Sacroiliitis, not elsewhere classified: Secondary | ICD-10-CM | POA: Diagnosis not present

## 2014-01-09 DIAGNOSIS — M47817 Spondylosis without myelopathy or radiculopathy, lumbosacral region: Secondary | ICD-10-CM | POA: Diagnosis not present

## 2014-01-09 DIAGNOSIS — K449 Diaphragmatic hernia without obstruction or gangrene: Secondary | ICD-10-CM | POA: Diagnosis not present

## 2014-01-09 DIAGNOSIS — G20 Parkinson's disease: Secondary | ICD-10-CM | POA: Diagnosis not present

## 2014-01-09 DIAGNOSIS — M25519 Pain in unspecified shoulder: Secondary | ICD-10-CM | POA: Diagnosis not present

## 2014-01-09 DIAGNOSIS — G8929 Other chronic pain: Secondary | ICD-10-CM | POA: Diagnosis not present

## 2014-01-09 DIAGNOSIS — F329 Major depressive disorder, single episode, unspecified: Secondary | ICD-10-CM | POA: Diagnosis not present

## 2014-01-09 DIAGNOSIS — M4716 Other spondylosis with myelopathy, lumbar region: Secondary | ICD-10-CM | POA: Diagnosis not present

## 2014-01-09 DIAGNOSIS — IMO0001 Reserved for inherently not codable concepts without codable children: Secondary | ICD-10-CM | POA: Diagnosis not present

## 2014-01-09 DIAGNOSIS — Z79899 Other long term (current) drug therapy: Secondary | ICD-10-CM | POA: Diagnosis not present

## 2014-01-14 ENCOUNTER — Other Ambulatory Visit (INDEPENDENT_AMBULATORY_CARE_PROVIDER_SITE_OTHER): Payer: Medicare Other

## 2014-01-14 DIAGNOSIS — E871 Hypo-osmolality and hyponatremia: Secondary | ICD-10-CM | POA: Diagnosis not present

## 2014-01-14 LAB — BASIC METABOLIC PANEL
BUN: 18 mg/dL (ref 6–23)
CALCIUM: 10 mg/dL (ref 8.4–10.5)
CO2: 29 mEq/L (ref 19–32)
Chloride: 97 mEq/L (ref 96–112)
Creatinine, Ser: 1 mg/dL (ref 0.4–1.2)
GFR: 57.26 mL/min — AB (ref >60.00)
GLUCOSE: 89 mg/dL (ref 70–99)
POTASSIUM: 4.2 meq/L (ref 3.5–5.1)
SODIUM: 134 meq/L — AB (ref 135–145)

## 2014-01-15 ENCOUNTER — Encounter: Payer: Self-pay | Admitting: *Deleted

## 2014-01-15 ENCOUNTER — Ambulatory Visit: Payer: Self-pay | Admitting: Pain Medicine

## 2014-01-15 DIAGNOSIS — IMO0002 Reserved for concepts with insufficient information to code with codable children: Secondary | ICD-10-CM | POA: Diagnosis not present

## 2014-01-19 ENCOUNTER — Other Ambulatory Visit: Payer: Self-pay | Admitting: Internal Medicine

## 2014-01-28 ENCOUNTER — Ambulatory Visit: Payer: Self-pay | Admitting: Ophthalmology

## 2014-01-28 DIAGNOSIS — H2510 Age-related nuclear cataract, unspecified eye: Secondary | ICD-10-CM | POA: Diagnosis not present

## 2014-01-28 DIAGNOSIS — Z01812 Encounter for preprocedural laboratory examination: Secondary | ICD-10-CM | POA: Diagnosis not present

## 2014-01-28 LAB — POTASSIUM: Potassium: 4.3 mmol/L (ref 3.5–5.1)

## 2014-02-02 ENCOUNTER — Ambulatory Visit: Payer: Self-pay | Admitting: Ophthalmology

## 2014-02-02 DIAGNOSIS — H2510 Age-related nuclear cataract, unspecified eye: Secondary | ICD-10-CM | POA: Diagnosis not present

## 2014-02-02 DIAGNOSIS — R011 Cardiac murmur, unspecified: Secondary | ICD-10-CM | POA: Diagnosis not present

## 2014-02-02 DIAGNOSIS — R259 Unspecified abnormal involuntary movements: Secondary | ICD-10-CM | POA: Diagnosis not present

## 2014-02-02 DIAGNOSIS — H269 Unspecified cataract: Secondary | ICD-10-CM | POA: Diagnosis not present

## 2014-02-02 DIAGNOSIS — Z853 Personal history of malignant neoplasm of breast: Secondary | ICD-10-CM | POA: Diagnosis not present

## 2014-02-02 DIAGNOSIS — Z7982 Long term (current) use of aspirin: Secondary | ICD-10-CM | POA: Diagnosis not present

## 2014-02-02 DIAGNOSIS — I10 Essential (primary) hypertension: Secondary | ICD-10-CM | POA: Diagnosis not present

## 2014-02-02 DIAGNOSIS — F411 Generalized anxiety disorder: Secondary | ICD-10-CM | POA: Diagnosis not present

## 2014-02-02 DIAGNOSIS — K219 Gastro-esophageal reflux disease without esophagitis: Secondary | ICD-10-CM | POA: Diagnosis not present

## 2014-02-02 DIAGNOSIS — M479 Spondylosis, unspecified: Secondary | ICD-10-CM | POA: Diagnosis not present

## 2014-02-06 ENCOUNTER — Ambulatory Visit: Payer: Self-pay | Admitting: Oncology

## 2014-02-09 DIAGNOSIS — Z79818 Long term (current) use of other agents affecting estrogen receptors and estrogen levels: Secondary | ICD-10-CM | POA: Diagnosis not present

## 2014-02-09 DIAGNOSIS — C50919 Malignant neoplasm of unspecified site of unspecified female breast: Secondary | ICD-10-CM | POA: Diagnosis not present

## 2014-02-09 DIAGNOSIS — Z170 Estrogen receptor positive status [ER+]: Secondary | ICD-10-CM | POA: Diagnosis not present

## 2014-02-09 DIAGNOSIS — G20 Parkinson's disease: Secondary | ICD-10-CM | POA: Diagnosis not present

## 2014-02-11 ENCOUNTER — Ambulatory Visit: Payer: Self-pay | Admitting: Pain Medicine

## 2014-02-11 DIAGNOSIS — M545 Low back pain, unspecified: Secondary | ICD-10-CM | POA: Diagnosis not present

## 2014-02-11 DIAGNOSIS — K219 Gastro-esophageal reflux disease without esophagitis: Secondary | ICD-10-CM | POA: Diagnosis not present

## 2014-02-11 DIAGNOSIS — F3289 Other specified depressive episodes: Secondary | ICD-10-CM | POA: Diagnosis not present

## 2014-02-11 DIAGNOSIS — Z79899 Other long term (current) drug therapy: Secondary | ICD-10-CM | POA: Diagnosis not present

## 2014-02-11 DIAGNOSIS — K449 Diaphragmatic hernia without obstruction or gangrene: Secondary | ICD-10-CM | POA: Diagnosis not present

## 2014-02-11 DIAGNOSIS — M79609 Pain in unspecified limb: Secondary | ICD-10-CM | POA: Diagnosis not present

## 2014-02-11 DIAGNOSIS — Z853 Personal history of malignant neoplasm of breast: Secondary | ICD-10-CM | POA: Diagnosis not present

## 2014-02-11 DIAGNOSIS — I10 Essential (primary) hypertension: Secondary | ICD-10-CM | POA: Diagnosis not present

## 2014-02-11 DIAGNOSIS — G8929 Other chronic pain: Secondary | ICD-10-CM | POA: Diagnosis not present

## 2014-02-11 DIAGNOSIS — G20 Parkinson's disease: Secondary | ICD-10-CM | POA: Diagnosis not present

## 2014-02-11 DIAGNOSIS — M47817 Spondylosis without myelopathy or radiculopathy, lumbosacral region: Secondary | ICD-10-CM | POA: Diagnosis not present

## 2014-02-11 DIAGNOSIS — F329 Major depressive disorder, single episode, unspecified: Secondary | ICD-10-CM | POA: Diagnosis not present

## 2014-02-11 DIAGNOSIS — M4712 Other spondylosis with myelopathy, cervical region: Secondary | ICD-10-CM | POA: Diagnosis not present

## 2014-02-11 DIAGNOSIS — M4716 Other spondylosis with myelopathy, lumbar region: Secondary | ICD-10-CM | POA: Diagnosis not present

## 2014-02-11 DIAGNOSIS — M5137 Other intervertebral disc degeneration, lumbosacral region: Secondary | ICD-10-CM | POA: Diagnosis not present

## 2014-02-11 DIAGNOSIS — M5030 Other cervical disc degeneration, unspecified cervical region: Secondary | ICD-10-CM | POA: Diagnosis not present

## 2014-02-11 DIAGNOSIS — M461 Sacroiliitis, not elsewhere classified: Secondary | ICD-10-CM | POA: Diagnosis not present

## 2014-02-11 DIAGNOSIS — IMO0002 Reserved for concepts with insufficient information to code with codable children: Secondary | ICD-10-CM | POA: Diagnosis not present

## 2014-02-15 ENCOUNTER — Ambulatory Visit: Payer: Self-pay | Admitting: Oncology

## 2014-02-17 ENCOUNTER — Ambulatory Visit: Payer: Self-pay | Admitting: Oncology

## 2014-02-17 ENCOUNTER — Other Ambulatory Visit: Payer: Self-pay | Admitting: Internal Medicine

## 2014-02-24 ENCOUNTER — Ambulatory Visit: Payer: Self-pay | Admitting: Pain Medicine

## 2014-02-24 DIAGNOSIS — M19019 Primary osteoarthritis, unspecified shoulder: Secondary | ICD-10-CM | POA: Diagnosis not present

## 2014-02-24 DIAGNOSIS — IMO0002 Reserved for concepts with insufficient information to code with codable children: Secondary | ICD-10-CM | POA: Diagnosis not present

## 2014-02-24 DIAGNOSIS — M25519 Pain in unspecified shoulder: Secondary | ICD-10-CM | POA: Diagnosis not present

## 2014-03-04 ENCOUNTER — Encounter (INDEPENDENT_AMBULATORY_CARE_PROVIDER_SITE_OTHER): Payer: Self-pay

## 2014-03-04 ENCOUNTER — Ambulatory Visit (INDEPENDENT_AMBULATORY_CARE_PROVIDER_SITE_OTHER): Payer: Medicare Other | Admitting: Internal Medicine

## 2014-03-04 ENCOUNTER — Encounter: Payer: Self-pay | Admitting: Internal Medicine

## 2014-03-04 VITALS — BP 130/90 | HR 55 | Temp 98.4°F | Ht 64.0 in | Wt 179.5 lb

## 2014-03-04 DIAGNOSIS — N390 Urinary tract infection, site not specified: Secondary | ICD-10-CM | POA: Diagnosis not present

## 2014-03-04 DIAGNOSIS — D649 Anemia, unspecified: Secondary | ICD-10-CM

## 2014-03-04 DIAGNOSIS — E785 Hyperlipidemia, unspecified: Secondary | ICD-10-CM | POA: Diagnosis not present

## 2014-03-04 DIAGNOSIS — B351 Tinea unguium: Secondary | ICD-10-CM

## 2014-03-04 DIAGNOSIS — I10 Essential (primary) hypertension: Secondary | ICD-10-CM | POA: Diagnosis not present

## 2014-03-04 DIAGNOSIS — B3749 Other urogenital candidiasis: Secondary | ICD-10-CM

## 2014-03-04 DIAGNOSIS — R259 Unspecified abnormal involuntary movements: Secondary | ICD-10-CM

## 2014-03-04 DIAGNOSIS — R251 Tremor, unspecified: Secondary | ICD-10-CM

## 2014-03-04 DIAGNOSIS — C50919 Malignant neoplasm of unspecified site of unspecified female breast: Secondary | ICD-10-CM

## 2014-03-04 LAB — LIPID PANEL
CHOLESTEROL: 244 mg/dL — AB (ref 0–200)
HDL: 48.6 mg/dL (ref >39.00)
LDL Cholesterol: 168 mg/dL — ABNORMAL HIGH (ref 0–99)
Total CHOL/HDL Ratio: 5
Triglycerides: 138 mg/dL (ref 0.0–149.0)
VLDL: 27.6 mg/dL (ref 0.0–40.0)

## 2014-03-04 LAB — BASIC METABOLIC PANEL
BUN: 20 mg/dL (ref 6–23)
CALCIUM: 9.5 mg/dL (ref 8.4–10.5)
CO2: 29 meq/L (ref 19–32)
Chloride: 97 mEq/L (ref 96–112)
Creatinine, Ser: 1 mg/dL (ref 0.4–1.2)
GFR: 57.24 mL/min — ABNORMAL LOW (ref >60.00)
GLUCOSE: 81 mg/dL (ref 70–99)
Potassium: 4.6 mEq/L (ref 3.5–5.1)
SODIUM: 131 meq/L — AB (ref 135–145)

## 2014-03-04 LAB — TSH: TSH: 1.32 u[IU]/mL (ref 0.35–5.50)

## 2014-03-04 LAB — HEPATIC FUNCTION PANEL
ALBUMIN: 4.4 g/dL (ref 3.5–5.2)
ALK PHOS: 49 U/L (ref 39–117)
ALT: 9 U/L (ref 0–35)
AST: 24 U/L (ref 0–37)
Bilirubin, Direct: 0 mg/dL (ref 0.0–0.3)
TOTAL PROTEIN: 7.4 g/dL (ref 6.0–8.3)
Total Bilirubin: 0.7 mg/dL (ref 0.3–1.2)

## 2014-03-04 MED ORDER — CIPROFLOXACIN HCL 250 MG PO TABS: 250.0000 mg | ORAL_TABLET | Freq: Two times a day (BID) | ORAL | Status: DC

## 2014-03-04 NOTE — Progress Notes (Signed)
Pre-visit discussion using our clinic review tool. No additional management support is needed unless otherwise documented below in the visit note.  

## 2014-03-05 ENCOUNTER — Telehealth: Payer: Self-pay | Admitting: *Deleted

## 2014-03-05 ENCOUNTER — Other Ambulatory Visit: Payer: Self-pay | Admitting: Internal Medicine

## 2014-03-05 DIAGNOSIS — E871 Hypo-osmolality and hyponatremia: Secondary | ICD-10-CM

## 2014-03-05 LAB — URINE CULTURE
Colony Count: NO GROWTH
Organism ID, Bacteria: NO GROWTH

## 2014-03-05 MED ORDER — PRAVASTATIN SODIUM 10 MG PO TABS: 10.0000 mg | ORAL_TABLET | Freq: Every day | ORAL | Status: DC

## 2014-03-05 NOTE — Progress Notes (Signed)
Order placed for f/u sodium.  ?

## 2014-03-05 NOTE — Telephone Encounter (Signed)
Message copied by Cecelia Byars on Thu Mar 05, 2014 10:53 AM ------      Message from: Alisa Graff      Created: Thu Mar 05, 2014  5:08 AM       Notify pt that her sodium is decreased.  Slightly more than last check.  Make sure not drinking an increased amount of free water.  Need to recheck soon to confirm stable.  Schedule a non fasting lab within the next several days to confirm stable.  Liver panel and thyroid function test - wnl.  Her total and LDL cholesterol increased.  Is she willing to start another cholesterol medication.  Need to know if she has had problems with cholesterol medication in the past.   If no, then would like to start pravastatin 10mg  q day.  She will need a liver panel checked 6 weeks after starting. ------

## 2014-03-08 ENCOUNTER — Encounter: Payer: Self-pay | Admitting: Internal Medicine

## 2014-03-08 DIAGNOSIS — B351 Tinea unguium: Secondary | ICD-10-CM | POA: Insufficient documentation

## 2014-03-08 NOTE — Assessment & Plan Note (Signed)
Blood count followed through oncology.  Colonoscopy 03/01/12 - diverticulosis and internal hemorrhoids.  Follow cbc.   

## 2014-03-08 NOTE — Assessment & Plan Note (Signed)
Low cholesterol diet.  Follow lipid panel.  On no cholesterol medicine now.   

## 2014-03-08 NOTE — Assessment & Plan Note (Signed)
Reoccurring symptoms today.  Urine dip with trace leukocytes.  Send for culture.  Treat with cipro as outlined.  Follow.

## 2014-03-08 NOTE — Assessment & Plan Note (Signed)
Saw Dr Potter.  See his note for details.  On neurontin.  Was started on Sinemet.  Better.  Follow.  Keep f/u appt with Dr Potter.   

## 2014-03-08 NOTE — Assessment & Plan Note (Signed)
S/p mastectomy and chemotherapy and XRT.  Recurrent breast cancer - right.  Continues follow up with Dr Finnegan.  Had recent breast biopsy - negative.  Seeing Dr Ely.   

## 2014-03-08 NOTE — Progress Notes (Signed)
Subjective:    Patient ID: Wanda Martinez, female    DOB: 1935/05/21, 79 y.o.   MRN: [id number]  Urinary Frequency  Associated symptoms include frequency.  79 year old female with past history of recurrent breast cancer s/p mastectomy, hypertension, hyperlipidemia and alopecia who comes in today for a scheduled follow up.  She was recently diagnosed with recurrent breast cancer on the right.  Is s/p right mastectomy.  Did have one lymph node positive.  Received chemotherapy and XRT.  States she is eating and drinking.  No nausea.  Breathing stable.  Off evista.  Bowels moving.   Was having increased tremors.  Saw Dr Melrose Nakayama.  Originally placed on neurontin.  Had f/u.  Was started on sinemet.   Also on citalopram 10 mg q day.  Overall feels better.  She feels this may be helping.  She reports having a persistent black third fingernail.  States did injure her nail/finger previously.  Reports the nail has come off and is replaced by a black nail.  Given persistence, desires evaluation.   States her blood pressure has been averaging 130/high 70s-low 80s.  She also reports dysuria and increased urinary frequency.  Some nocturia and incontinence.  This started over the past week.  Otherwise feels things are stable.      Past Medical History  Diagnosis Date  . Breast cancer     Masectomy - left - 1986   . GERD (gastroesophageal reflux disease)   . Hiatal hernia   . HTN (hypertension)   . Chronic cystitis   . Urinary incontinence     mixed   . Arthritis   . Anemia     Current Outpatient Prescriptions on File Prior to Visit  Medication Sig Dispense Refill  . aspirin 81 MG EC tablet Take 81 mg by mouth daily as needed.        . Calcium Carbonate (CALCIUM 600) 1500 MG TABS Take 1 tablet by mouth daily.        . carbidopa-levodopa (SINEMET IR) 25-100 MG per tablet Take 1 tablet by mouth 3 (three) times daily.      . citalopram (CELEXA) 10 MG tablet Take 20 mg by mouth daily.      . fish oil-omega-3  fatty acids 1000 MG capsule Take 2 g by mouth 2 (two) times daily.      Marland Kitchen gabapentin (NEURONTIN) 300 MG capsule Take 300 mg by mouth 2 (two) times daily.      . Glucosamine-Chondroit-Vit C-Mn (GLUCOSAMINE CHONDROITIN COMPLX) CAPS Take 2 capsules by mouth daily.        Marland Kitchen HYDROcodone-acetaminophen (NORCO) 5-325 MG per tablet Take 0.5-1 tablets by mouth daily.        Marland Kitchen losartan (COZAAR) 100 MG tablet TAKE 1 TABLET BY MOUTH ONCE DAILY.  90 tablet  1  . meloxicam (MOBIC) 7.5 MG tablet Take 1 tablet (7.5 mg total) by mouth daily.  30 tablet  2  . metoprolol succinate (TOPROL-XL) 25 MG 24 hr tablet Take 1 tablet (25 mg total) by mouth 2 (two) times daily.  180 tablet  3  . Multiple Vitamin (MULTIVITAMIN) tablet Take 1 tablet by mouth daily.      Marland Kitchen omeprazole (PRILOSEC) 20 MG capsule TAKE (1) CAPSULE BY MOUTH TWICE DAILY.  180 capsule  1   No current facility-administered medications on file prior to visit.    Review of Systems  Genitourinary: Positive for frequency.  Patient denies any headache. No dizziness reported.   No  significant sinus problems currently.  No chest pain, tightness or palpitations.  No increased shortness of breath or cough.  Breathing stable.   No nausea or vomiting.    Eating and drinking.   No bowel change, such as diarrhea, constipation, BRBPR or melana.  Tremors as outlined.  Doing some better since starting sinemet.   Feels better.  On citalopram.   Urinary symptoms as outlined.            Objective:   Physical Exam  Filed Vitals:   03/04/14 0958  BP: 130/90  Pulse: 55  Temp: 98.4 F (36.9 C)   Blood pressure recheck:  7/40  79 year old female in no acute distress.   HEENT:  Nares- clear.  Oropharynx - without lesions. NECK:  Supple.  Nontender.  No audible bruit.  HEART:  Appears to be regular. LUNGS:  No crackles or wheezing audible.  Respirations even and unlabored.  RADIAL PULSE:  Equal bilaterally.     ABDOMEN:  Soft, nontender.  Bowel sounds present and  normal.  No audible abdominal bruit.    EXTREMITIES:  No increased edema present.       Assessment & Plan:  ABDOMINAL PAIN.  Previous pelvic ultrasound unrevealing.  CT unrevealing.  Colonoscopy 03/01/12 revealed diverticulosis and internal hemorrhoids.  Follow.  Not reported as an issue today.   PULMONARY.  Stable.    HEALTH MAINTENANCE.  Physical 10/28/13. .  Colonoscopy as outlined.  Mammograms through oncology.   I spent 25 minutes with the patient and more than 50% of the time was spent in consultation regarding the above.

## 2014-03-08 NOTE — Assessment & Plan Note (Signed)
Blood pressure as oultined.  Back on HCTZ.  Better.  Check metabolic panel.  Follow sodium.   

## 2014-03-08 NOTE — Assessment & Plan Note (Signed)
Toe nail fungus and blackened toe nail - persistent.  Refer to dermatology.

## 2014-03-11 ENCOUNTER — Ambulatory Visit: Payer: Self-pay | Admitting: Pain Medicine

## 2014-03-11 ENCOUNTER — Other Ambulatory Visit (INDEPENDENT_AMBULATORY_CARE_PROVIDER_SITE_OTHER): Payer: Medicare Other

## 2014-03-11 DIAGNOSIS — K449 Diaphragmatic hernia without obstruction or gangrene: Secondary | ICD-10-CM | POA: Diagnosis not present

## 2014-03-11 DIAGNOSIS — M545 Low back pain, unspecified: Secondary | ICD-10-CM | POA: Diagnosis not present

## 2014-03-11 DIAGNOSIS — E871 Hypo-osmolality and hyponatremia: Secondary | ICD-10-CM

## 2014-03-11 DIAGNOSIS — IMO0002 Reserved for concepts with insufficient information to code with codable children: Secondary | ICD-10-CM | POA: Diagnosis not present

## 2014-03-11 DIAGNOSIS — M461 Sacroiliitis, not elsewhere classified: Secondary | ICD-10-CM | POA: Diagnosis not present

## 2014-03-11 DIAGNOSIS — F3289 Other specified depressive episodes: Secondary | ICD-10-CM | POA: Diagnosis not present

## 2014-03-11 DIAGNOSIS — M79609 Pain in unspecified limb: Secondary | ICD-10-CM | POA: Diagnosis not present

## 2014-03-11 DIAGNOSIS — G8929 Other chronic pain: Secondary | ICD-10-CM | POA: Diagnosis not present

## 2014-03-11 DIAGNOSIS — M4712 Other spondylosis with myelopathy, cervical region: Secondary | ICD-10-CM | POA: Diagnosis not present

## 2014-03-11 DIAGNOSIS — M5137 Other intervertebral disc degeneration, lumbosacral region: Secondary | ICD-10-CM | POA: Diagnosis not present

## 2014-03-11 DIAGNOSIS — G20 Parkinson's disease: Secondary | ICD-10-CM | POA: Diagnosis not present

## 2014-03-11 DIAGNOSIS — M4716 Other spondylosis with myelopathy, lumbar region: Secondary | ICD-10-CM | POA: Diagnosis not present

## 2014-03-11 DIAGNOSIS — Z79899 Other long term (current) drug therapy: Secondary | ICD-10-CM | POA: Diagnosis not present

## 2014-03-11 DIAGNOSIS — I10 Essential (primary) hypertension: Secondary | ICD-10-CM | POA: Diagnosis not present

## 2014-03-11 DIAGNOSIS — M5030 Other cervical disc degeneration, unspecified cervical region: Secondary | ICD-10-CM | POA: Diagnosis not present

## 2014-03-11 DIAGNOSIS — K219 Gastro-esophageal reflux disease without esophagitis: Secondary | ICD-10-CM | POA: Diagnosis not present

## 2014-03-11 DIAGNOSIS — IMO0001 Reserved for inherently not codable concepts without codable children: Secondary | ICD-10-CM | POA: Diagnosis not present

## 2014-03-11 DIAGNOSIS — Z853 Personal history of malignant neoplasm of breast: Secondary | ICD-10-CM | POA: Diagnosis not present

## 2014-03-11 DIAGNOSIS — M47817 Spondylosis without myelopathy or radiculopathy, lumbosacral region: Secondary | ICD-10-CM | POA: Diagnosis not present

## 2014-03-11 DIAGNOSIS — F329 Major depressive disorder, single episode, unspecified: Secondary | ICD-10-CM | POA: Diagnosis not present

## 2014-03-11 LAB — SODIUM: Sodium: 132 mEq/L — ABNORMAL LOW (ref 135–145)

## 2014-03-12 ENCOUNTER — Other Ambulatory Visit: Payer: Self-pay | Admitting: Internal Medicine

## 2014-03-12 DIAGNOSIS — E871 Hypo-osmolality and hyponatremia: Secondary | ICD-10-CM

## 2014-03-12 NOTE — Progress Notes (Signed)
Order for f/u sodium placed.

## 2014-03-26 DIAGNOSIS — M545 Low back pain, unspecified: Secondary | ICD-10-CM

## 2014-03-26 DIAGNOSIS — M51379 Other intervertebral disc degeneration, lumbosacral region without mention of lumbar back pain or lower extremity pain: Secondary | ICD-10-CM

## 2014-03-26 DIAGNOSIS — E7800 Pure hypercholesterolemia, unspecified: Secondary | ICD-10-CM | POA: Insufficient documentation

## 2014-03-26 DIAGNOSIS — E785 Hyperlipidemia, unspecified: Secondary | ICD-10-CM | POA: Insufficient documentation

## 2014-03-26 DIAGNOSIS — M76899 Other specified enthesopathies of unspecified lower limb, excluding foot: Secondary | ICD-10-CM

## 2014-03-26 DIAGNOSIS — M5137 Other intervertebral disc degeneration, lumbosacral region: Secondary | ICD-10-CM

## 2014-03-26 DIAGNOSIS — M1990 Unspecified osteoarthritis, unspecified site: Secondary | ICD-10-CM

## 2014-03-26 HISTORY — DX: Other intervertebral disc degeneration, lumbosacral region: M51.37

## 2014-03-26 HISTORY — DX: Unspecified osteoarthritis, unspecified site: M19.90

## 2014-03-26 HISTORY — DX: Other specified enthesopathies of unspecified lower limb, excluding foot: M76.899

## 2014-03-26 HISTORY — DX: Other intervertebral disc degeneration, lumbosacral region without mention of lumbar back pain or lower extremity pain: M51.379

## 2014-03-26 HISTORY — DX: Low back pain, unspecified: M54.50

## 2014-03-27 ENCOUNTER — Ambulatory Visit: Payer: Self-pay | Admitting: Oncology

## 2014-03-27 DIAGNOSIS — C50919 Malignant neoplasm of unspecified site of unspecified female breast: Secondary | ICD-10-CM | POA: Diagnosis not present

## 2014-03-27 DIAGNOSIS — Z79811 Long term (current) use of aromatase inhibitors: Secondary | ICD-10-CM | POA: Diagnosis not present

## 2014-03-27 DIAGNOSIS — Z923 Personal history of irradiation: Secondary | ICD-10-CM | POA: Diagnosis not present

## 2014-03-27 DIAGNOSIS — Z170 Estrogen receptor positive status [ER+]: Secondary | ICD-10-CM | POA: Diagnosis not present

## 2014-03-30 DIAGNOSIS — Z170 Estrogen receptor positive status [ER+]: Secondary | ICD-10-CM | POA: Diagnosis not present

## 2014-03-30 DIAGNOSIS — C50919 Malignant neoplasm of unspecified site of unspecified female breast: Secondary | ICD-10-CM | POA: Diagnosis not present

## 2014-03-30 DIAGNOSIS — Z79811 Long term (current) use of aromatase inhibitors: Secondary | ICD-10-CM | POA: Diagnosis not present

## 2014-04-02 ENCOUNTER — Other Ambulatory Visit (INDEPENDENT_AMBULATORY_CARE_PROVIDER_SITE_OTHER): Payer: Medicare Other

## 2014-04-02 DIAGNOSIS — E871 Hypo-osmolality and hyponatremia: Secondary | ICD-10-CM

## 2014-04-02 LAB — SODIUM: Sodium: 134 mEq/L — ABNORMAL LOW (ref 135–145)

## 2014-04-03 ENCOUNTER — Encounter: Payer: Self-pay | Admitting: *Deleted

## 2014-04-16 ENCOUNTER — Other Ambulatory Visit: Payer: Self-pay | Admitting: Internal Medicine

## 2014-04-16 DIAGNOSIS — E7800 Pure hypercholesterolemia, unspecified: Secondary | ICD-10-CM

## 2014-04-16 NOTE — Telephone Encounter (Signed)
Ok to refill pravastatin x 3.  I will place the order for the labs.  Thanks.

## 2014-04-16 NOTE — Progress Notes (Signed)
Order placed for liver panel.  

## 2014-04-16 NOTE — Telephone Encounter (Signed)
Received refill request for Pravastatin. Spoke with pt notified of need for repeat liver tests after Pravastatin x 6 weeks. Pt will be in town tomorrow, so appt scheduled 04/17/14. Pt tolerating well. Needs orders, I can enter if needed, just LFTs?

## 2014-04-17 ENCOUNTER — Ambulatory Visit: Payer: Self-pay | Admitting: Oncology

## 2014-04-17 ENCOUNTER — Other Ambulatory Visit (INDEPENDENT_AMBULATORY_CARE_PROVIDER_SITE_OTHER): Payer: Medicare Other

## 2014-04-17 ENCOUNTER — Ambulatory Visit: Payer: Self-pay | Admitting: Pain Medicine

## 2014-04-17 DIAGNOSIS — M545 Low back pain, unspecified: Secondary | ICD-10-CM | POA: Diagnosis not present

## 2014-04-17 DIAGNOSIS — M79609 Pain in unspecified limb: Secondary | ICD-10-CM | POA: Diagnosis not present

## 2014-04-17 DIAGNOSIS — M461 Sacroiliitis, not elsewhere classified: Secondary | ICD-10-CM | POA: Diagnosis not present

## 2014-04-17 DIAGNOSIS — G8929 Other chronic pain: Secondary | ICD-10-CM | POA: Diagnosis not present

## 2014-04-17 DIAGNOSIS — M4716 Other spondylosis with myelopathy, lumbar region: Secondary | ICD-10-CM | POA: Diagnosis not present

## 2014-04-17 DIAGNOSIS — M47817 Spondylosis without myelopathy or radiculopathy, lumbosacral region: Secondary | ICD-10-CM | POA: Diagnosis not present

## 2014-04-17 DIAGNOSIS — M5126 Other intervertebral disc displacement, lumbar region: Secondary | ICD-10-CM | POA: Diagnosis not present

## 2014-04-17 DIAGNOSIS — Z853 Personal history of malignant neoplasm of breast: Secondary | ICD-10-CM | POA: Diagnosis not present

## 2014-04-17 DIAGNOSIS — M5137 Other intervertebral disc degeneration, lumbosacral region: Secondary | ICD-10-CM | POA: Diagnosis not present

## 2014-04-17 DIAGNOSIS — F329 Major depressive disorder, single episode, unspecified: Secondary | ICD-10-CM | POA: Diagnosis not present

## 2014-04-17 DIAGNOSIS — M5030 Other cervical disc degeneration, unspecified cervical region: Secondary | ICD-10-CM | POA: Diagnosis not present

## 2014-04-17 DIAGNOSIS — G20 Parkinson's disease: Secondary | ICD-10-CM | POA: Diagnosis not present

## 2014-04-17 DIAGNOSIS — E7800 Pure hypercholesterolemia, unspecified: Secondary | ICD-10-CM | POA: Diagnosis not present

## 2014-04-17 DIAGNOSIS — F3289 Other specified depressive episodes: Secondary | ICD-10-CM | POA: Diagnosis not present

## 2014-04-17 DIAGNOSIS — Z79899 Other long term (current) drug therapy: Secondary | ICD-10-CM | POA: Diagnosis not present

## 2014-04-17 DIAGNOSIS — I10 Essential (primary) hypertension: Secondary | ICD-10-CM | POA: Diagnosis not present

## 2014-04-17 DIAGNOSIS — M4712 Other spondylosis with myelopathy, cervical region: Secondary | ICD-10-CM | POA: Diagnosis not present

## 2014-04-17 DIAGNOSIS — K219 Gastro-esophageal reflux disease without esophagitis: Secondary | ICD-10-CM | POA: Diagnosis not present

## 2014-04-17 DIAGNOSIS — IMO0002 Reserved for concepts with insufficient information to code with codable children: Secondary | ICD-10-CM | POA: Diagnosis not present

## 2014-04-17 DIAGNOSIS — K449 Diaphragmatic hernia without obstruction or gangrene: Secondary | ICD-10-CM | POA: Diagnosis not present

## 2014-04-17 LAB — HEPATIC FUNCTION PANEL
ALBUMIN: 3.9 g/dL (ref 3.5–5.2)
ALK PHOS: 51 U/L (ref 39–117)
ALT: 10 U/L (ref 0–35)
AST: 29 U/L (ref 0–37)
Bilirubin, Direct: 0 mg/dL (ref 0.0–0.3)
TOTAL PROTEIN: 6.6 g/dL (ref 6.0–8.3)
Total Bilirubin: 0.5 mg/dL (ref 0.3–1.2)

## 2014-04-18 ENCOUNTER — Ambulatory Visit: Payer: Self-pay | Admitting: Oncology

## 2014-04-20 ENCOUNTER — Encounter: Payer: Self-pay | Admitting: *Deleted

## 2014-04-20 ENCOUNTER — Other Ambulatory Visit: Payer: Self-pay | Admitting: Internal Medicine

## 2014-04-20 DIAGNOSIS — E785 Hyperlipidemia, unspecified: Secondary | ICD-10-CM

## 2014-04-20 DIAGNOSIS — I10 Essential (primary) hypertension: Secondary | ICD-10-CM

## 2014-04-20 NOTE — Progress Notes (Signed)
Order placed for f/u labs.  

## 2014-04-30 ENCOUNTER — Ambulatory Visit: Payer: Self-pay | Admitting: Pain Medicine

## 2014-04-30 DIAGNOSIS — IMO0002 Reserved for concepts with insufficient information to code with codable children: Secondary | ICD-10-CM | POA: Diagnosis not present

## 2014-05-13 DIAGNOSIS — F4542 Pain disorder with related psychological factors: Secondary | ICD-10-CM | POA: Diagnosis not present

## 2014-05-14 DIAGNOSIS — C50919 Malignant neoplasm of unspecified site of unspecified female breast: Secondary | ICD-10-CM | POA: Diagnosis not present

## 2014-05-14 DIAGNOSIS — G20 Parkinson's disease: Secondary | ICD-10-CM | POA: Diagnosis not present

## 2014-05-14 DIAGNOSIS — F4542 Pain disorder with related psychological factors: Secondary | ICD-10-CM | POA: Diagnosis not present

## 2014-05-14 DIAGNOSIS — Z79811 Long term (current) use of aromatase inhibitors: Secondary | ICD-10-CM | POA: Diagnosis not present

## 2014-05-14 DIAGNOSIS — Z170 Estrogen receptor positive status [ER+]: Secondary | ICD-10-CM | POA: Diagnosis not present

## 2014-05-18 ENCOUNTER — Ambulatory Visit: Payer: Self-pay | Admitting: Oncology

## 2014-05-25 ENCOUNTER — Ambulatory Visit: Payer: Self-pay | Admitting: Pain Medicine

## 2014-05-25 DIAGNOSIS — M4716 Other spondylosis with myelopathy, lumbar region: Secondary | ICD-10-CM | POA: Diagnosis not present

## 2014-05-25 DIAGNOSIS — M5137 Other intervertebral disc degeneration, lumbosacral region: Secondary | ICD-10-CM | POA: Diagnosis not present

## 2014-05-25 DIAGNOSIS — M461 Sacroiliitis, not elsewhere classified: Secondary | ICD-10-CM | POA: Diagnosis not present

## 2014-05-25 DIAGNOSIS — K219 Gastro-esophageal reflux disease without esophagitis: Secondary | ICD-10-CM | POA: Diagnosis not present

## 2014-05-25 DIAGNOSIS — G20 Parkinson's disease: Secondary | ICD-10-CM | POA: Diagnosis not present

## 2014-05-25 DIAGNOSIS — F329 Major depressive disorder, single episode, unspecified: Secondary | ICD-10-CM | POA: Diagnosis not present

## 2014-05-25 DIAGNOSIS — M545 Low back pain, unspecified: Secondary | ICD-10-CM | POA: Diagnosis not present

## 2014-05-25 DIAGNOSIS — Z853 Personal history of malignant neoplasm of breast: Secondary | ICD-10-CM | POA: Diagnosis not present

## 2014-05-25 DIAGNOSIS — Z79899 Other long term (current) drug therapy: Secondary | ICD-10-CM | POA: Diagnosis not present

## 2014-05-25 DIAGNOSIS — M47817 Spondylosis without myelopathy or radiculopathy, lumbosacral region: Secondary | ICD-10-CM | POA: Diagnosis not present

## 2014-05-25 DIAGNOSIS — I10 Essential (primary) hypertension: Secondary | ICD-10-CM | POA: Diagnosis not present

## 2014-05-25 DIAGNOSIS — M5126 Other intervertebral disc displacement, lumbar region: Secondary | ICD-10-CM | POA: Diagnosis not present

## 2014-05-25 DIAGNOSIS — M79609 Pain in unspecified limb: Secondary | ICD-10-CM | POA: Diagnosis not present

## 2014-05-25 DIAGNOSIS — K449 Diaphragmatic hernia without obstruction or gangrene: Secondary | ICD-10-CM | POA: Diagnosis not present

## 2014-05-25 DIAGNOSIS — M4712 Other spondylosis with myelopathy, cervical region: Secondary | ICD-10-CM | POA: Diagnosis not present

## 2014-05-25 DIAGNOSIS — M5030 Other cervical disc degeneration, unspecified cervical region: Secondary | ICD-10-CM | POA: Diagnosis not present

## 2014-05-25 DIAGNOSIS — G8929 Other chronic pain: Secondary | ICD-10-CM | POA: Diagnosis not present

## 2014-05-25 DIAGNOSIS — IMO0002 Reserved for concepts with insufficient information to code with codable children: Secondary | ICD-10-CM | POA: Diagnosis not present

## 2014-05-25 DIAGNOSIS — F3289 Other specified depressive episodes: Secondary | ICD-10-CM | POA: Diagnosis not present

## 2014-05-25 DIAGNOSIS — IMO0001 Reserved for inherently not codable concepts without codable children: Secondary | ICD-10-CM | POA: Diagnosis not present

## 2014-06-14 ENCOUNTER — Other Ambulatory Visit: Payer: Self-pay | Admitting: Internal Medicine

## 2014-06-22 DIAGNOSIS — F32A Depression, unspecified: Secondary | ICD-10-CM | POA: Insufficient documentation

## 2014-06-22 DIAGNOSIS — I10 Essential (primary) hypertension: Secondary | ICD-10-CM | POA: Diagnosis not present

## 2014-06-22 DIAGNOSIS — F320 Major depressive disorder, single episode, mild: Secondary | ICD-10-CM | POA: Insufficient documentation

## 2014-06-22 DIAGNOSIS — G20 Parkinson's disease: Secondary | ICD-10-CM | POA: Insufficient documentation

## 2014-06-22 DIAGNOSIS — E669 Obesity, unspecified: Secondary | ICD-10-CM | POA: Diagnosis not present

## 2014-06-22 DIAGNOSIS — G20A1 Parkinson's disease without dyskinesia, without mention of fluctuations: Secondary | ICD-10-CM | POA: Insufficient documentation

## 2014-06-22 DIAGNOSIS — R259 Unspecified abnormal involuntary movements: Secondary | ICD-10-CM | POA: Diagnosis not present

## 2014-06-22 DIAGNOSIS — F4321 Adjustment disorder with depressed mood: Secondary | ICD-10-CM

## 2014-07-06 ENCOUNTER — Other Ambulatory Visit (INDEPENDENT_AMBULATORY_CARE_PROVIDER_SITE_OTHER): Payer: Medicare Other

## 2014-07-06 DIAGNOSIS — I10 Essential (primary) hypertension: Secondary | ICD-10-CM | POA: Diagnosis not present

## 2014-07-06 DIAGNOSIS — E785 Hyperlipidemia, unspecified: Secondary | ICD-10-CM | POA: Diagnosis not present

## 2014-07-06 LAB — BASIC METABOLIC PANEL
BUN: 13 mg/dL (ref 6–23)
CHLORIDE: 102 meq/L (ref 96–112)
CO2: 29 meq/L (ref 19–32)
CREATININE: 0.8 mg/dL (ref 0.4–1.2)
Calcium: 9.6 mg/dL (ref 8.4–10.5)
GFR: 70.9 mL/min (ref >60.00)
GLUCOSE: 82 mg/dL (ref 70–99)
Potassium: 4.5 mEq/L (ref 3.5–5.1)
Sodium: 137 mEq/L (ref 135–145)

## 2014-07-06 LAB — LIPID PANEL
CHOL/HDL RATIO: 6
Cholesterol: 225 mg/dL — ABNORMAL HIGH (ref 0–200)
HDL: 39.5 mg/dL (ref >39.00)
LDL Cholesterol: 147 mg/dL — ABNORMAL HIGH (ref 0–99)
NONHDL: 185.5
Triglycerides: 195 mg/dL — ABNORMAL HIGH (ref 0.0–149.0)
VLDL: 39 mg/dL (ref 0.0–40.0)

## 2014-07-06 LAB — HEPATIC FUNCTION PANEL
ALK PHOS: 56 U/L (ref 39–117)
ALT: 6 U/L (ref 0–35)
AST: 26 U/L (ref 0–37)
Albumin: 3.9 g/dL (ref 3.5–5.2)
BILIRUBIN DIRECT: 0.1 mg/dL (ref 0.0–0.3)
BILIRUBIN TOTAL: 0.4 mg/dL (ref 0.2–1.2)
Total Protein: 6.6 g/dL (ref 6.0–8.3)

## 2014-07-08 ENCOUNTER — Encounter: Payer: Self-pay | Admitting: Internal Medicine

## 2014-07-08 ENCOUNTER — Ambulatory Visit (INDEPENDENT_AMBULATORY_CARE_PROVIDER_SITE_OTHER): Payer: Medicare Other | Admitting: Internal Medicine

## 2014-07-08 VITALS — BP 122/82 | HR 61 | Temp 98.9°F | Ht 64.0 in | Wt 183.5 lb

## 2014-07-08 DIAGNOSIS — R251 Tremor, unspecified: Secondary | ICD-10-CM

## 2014-07-08 DIAGNOSIS — D649 Anemia, unspecified: Secondary | ICD-10-CM

## 2014-07-08 DIAGNOSIS — L989 Disorder of the skin and subcutaneous tissue, unspecified: Secondary | ICD-10-CM

## 2014-07-08 DIAGNOSIS — C50919 Malignant neoplasm of unspecified site of unspecified female breast: Secondary | ICD-10-CM

## 2014-07-08 DIAGNOSIS — R259 Unspecified abnormal involuntary movements: Secondary | ICD-10-CM

## 2014-07-08 DIAGNOSIS — I10 Essential (primary) hypertension: Secondary | ICD-10-CM

## 2014-07-08 DIAGNOSIS — E785 Hyperlipidemia, unspecified: Secondary | ICD-10-CM | POA: Diagnosis not present

## 2014-07-08 DIAGNOSIS — M542 Cervicalgia: Secondary | ICD-10-CM

## 2014-07-08 NOTE — Progress Notes (Signed)
Pre visit review using our clinic review tool, if applicable. No additional management support is needed unless otherwise documented below in the visit note. 

## 2014-07-12 ENCOUNTER — Encounter: Payer: Self-pay | Admitting: Internal Medicine

## 2014-07-12 DIAGNOSIS — L989 Disorder of the skin and subcutaneous tissue, unspecified: Secondary | ICD-10-CM | POA: Insufficient documentation

## 2014-07-12 HISTORY — DX: Disorder of the skin and subcutaneous tissue, unspecified: L98.9

## 2014-07-12 NOTE — Assessment & Plan Note (Signed)
Skin lesions as outlined.  Also has the black nail.  Wants referral to Dr Koleen Nimrod.

## 2014-07-12 NOTE — Assessment & Plan Note (Signed)
Blood count followed through oncology.  Colonoscopy 03/01/12 - diverticulosis and internal hemorrhoids.  Follow [facility].

## 2014-07-12 NOTE — Progress Notes (Signed)
Subjective:    Patient ID: Wanda Martinez, female    DOB: Apr 23, 1935, 79 y.o.   MRN: [id number]  HPI 79 year old female with past history of recurrent breast cancer s/p mastectomy, hypertension, hyperlipidemia and alopecia who comes in today for a scheduled follow up.  She was recently diagnosed with recurrent breast cancer on the right.  Is s/p right mastectomy.  Did have one lymph node positive.  Received chemotherapy and XRT.  States she is eating and drinking.  No nausea.  Breathing stable.  Off evista.  Bowels moving.   Was having increased tremors.  Saw Dr Melrose Nakayama.  Originally placed on neurontin.  Had f/u.  Was started on sinemet for treatment of parkinsons.  Overall feels better.  She feels this is helping.  She is having some pain in her left shoulder > right shoulder.  Some discomfort in her nec with movement.  Also has noticed left eyebrow lesion.  Sates is getting bigger.  Also has a lesion on he right third finger and a black nail left third finger.  Request referral to see Dr Koleen Nimrod.  Is being followed at the cancer center.  States she is up to date.  Overall she feels better and feel things are stable.         Past Medical History  Diagnosis Date  . Breast cancer     Masectomy - left - 1986   . GERD (gastroesophageal reflux disease)   . Hiatal hernia   . HTN (hypertension)   . Chronic cystitis   . Urinary incontinence     mixed   . Arthritis   . Anemia     Current Outpatient Prescriptions on File Prior to Visit  Medication Sig Dispense Refill  . aspirin 81 MG EC tablet Take 81 mg by mouth daily as needed.        . Calcium Carbonate (CALCIUM 600) 1500 MG TABS Take 1 tablet by mouth daily.        . carbidopa-levodopa (SINEMET IR) 25-100 MG per tablet Take 1 tablet by mouth 3 (three) times daily.      . citalopram (CELEXA) 10 MG tablet Take 20 mg by mouth daily.      . fish oil-omega-3 fatty acids 1000 MG capsule Take 2 g by mouth 2 (two) times daily.      Marland Kitchen gabapentin  (NEURONTIN) 300 MG capsule Take 300 mg by mouth 2 (two) times daily.      . Glucosamine-Chondroit-Vit C-Mn (GLUCOSAMINE CHONDROITIN COMPLX) CAPS Take 2 capsules by mouth daily.        . hydrochlorothiazide (HYDRODIURIL) 25 MG tablet Take 12.5 mg by mouth daily.      Marland Kitchen HYDROcodone-acetaminophen (NORCO) 5-325 MG per tablet Take 0.5-1 tablets by mouth daily.        Marland Kitchen losartan (COZAAR) 100 MG tablet TAKE 1 TABLET BY MOUTH ONCE DAILY.  90 tablet  1  . meloxicam (MOBIC) 7.5 MG tablet Take 1 tablet (7.5 mg total) by mouth daily.  30 tablet  2  . Methenamine-Sodium Salicylate (CYSTEX PO) Take by mouth.      . metoprolol succinate (TOPROL-XL) 25 MG 24 hr tablet Take 1 tablet (25 mg total) by mouth 2 (two) times daily.  180 tablet  3  . metoprolol succinate (TOPROL-XL) 25 MG 24 hr tablet TAKE 1 TABLET BY MOUTH TWICE DAILY  180 tablet  1  . Multiple Vitamin (MULTIVITAMIN) tablet Take 1 tablet by mouth daily.      Marland Kitchen  omeprazole (PRILOSEC) 20 MG capsule TAKE (1) CAPSULE BY MOUTH TWICE DAILY.  180 capsule  1  . pravastatin (PRAVACHOL) 10 MG tablet TAKE 1 TABLET BY MOUTH AT BEDTIME FOR CHOLESTEROL  30 tablet  5   No current facility-administered medications on file prior to visit.    Review of Systems Patient denies any headache. No significant dizziness.   No significant sinus problems currently.  No chest pain, tightness or palpitations.  No increased shortness of breath or cough.  Breathing stable.   No nausea or vomiting.    Eating and drinking.   No bowel change, such as diarrhea, constipation, BRBPR or melana.  Tremors as outlined.  Doing some better since starting sinemet.   Feels better.  Some shoulder pain as outlined.  Handling stress relatively well.  Blood pressures have been better.   Skin lesions as outlined.              Objective:   Physical Exam  Filed Vitals:   07/08/14 0958  BP: 122/82  Pulse: 61  Temp: 98.9 F (37.2 C)   Blood pressure recheck:  69/14  79 year old female in no  acute distress.   HEENT:  Nares- clear.  Oropharynx - without lesions. NECK:  Supple.  Nontender.  No audible bruit.  HEART:  Appears to be regular. LUNGS:  No crackles or wheezing audible.  Respirations even and unlabored.  RADIAL PULSE:  Equal bilaterally.    ABDOMEN:  Soft, nontender.  Bowel sounds present and normal.  No audible abdominal bruit.    EXTREMITIES:  No increased edema present.  DP pulses palpable and equal bilaterally.   No increased erythema or warmth.       Assessment & Plan:  ABDOMINAL PAIN.  Previous pelvic ultrasound unrevealing.  CT unrevealing.  Colonoscopy 03/01/12 revealed diverticulosis and internal hemorrhoids.  Follow.  Not reported as an issue today.   PULMONARY.  Stable.    HEALTH MAINTENANCE.  Physical 10/28/13. .  Colonoscopy as outlined.  Mammograms through oncology.   I spent 25 minutes with the patient and more than 50% of the time was spent in consultation regarding the above.

## 2014-07-12 NOTE — Assessment & Plan Note (Signed)
Saw Dr Melrose Nakayama.  See his note for details.  On neurontin.  Was started on Sinemet.  Better.  Follow.  Keep f/u appt with Dr Melrose Nakayama.

## 2014-07-12 NOTE — Assessment & Plan Note (Signed)
S/p mastectomy and chemotherapy and XRT.  Recurrent breast cancer - right.  Continues follow up with Dr Grayland Ormond.  Had recent breast biopsy - negative.  Seeing Dr Pat Patrick.

## 2014-07-12 NOTE — Assessment & Plan Note (Signed)
Low cholesterol diet.  Follow lipid panel.  On no cholesterol medicine now.

## 2014-07-12 NOTE — Assessment & Plan Note (Signed)
Blood pressure as oultined.  Back on HCTZ.  Better.  Check metabolic panel.  Follow sodium.

## 2014-07-12 NOTE — Assessment & Plan Note (Signed)
Neck pain.  Persistent.  On meloxicam.  C-spine xray with degenerative disc disease C3-C7.  Had recommended physical therapy.  She had wanted to hold.  Follow.

## 2014-08-14 ENCOUNTER — Ambulatory Visit: Payer: Self-pay | Admitting: Pain Medicine

## 2014-08-14 DIAGNOSIS — M545 Low back pain, unspecified: Secondary | ICD-10-CM | POA: Diagnosis not present

## 2014-08-14 DIAGNOSIS — M79609 Pain in unspecified limb: Secondary | ICD-10-CM | POA: Diagnosis not present

## 2014-08-14 DIAGNOSIS — M47817 Spondylosis without myelopathy or radiculopathy, lumbosacral region: Secondary | ICD-10-CM | POA: Diagnosis not present

## 2014-08-14 DIAGNOSIS — IMO0002 Reserved for concepts with insufficient information to code with codable children: Secondary | ICD-10-CM | POA: Diagnosis not present

## 2014-09-04 ENCOUNTER — Ambulatory Visit: Payer: Self-pay | Admitting: Oncology

## 2014-09-04 DIAGNOSIS — M949 Disorder of cartilage, unspecified: Secondary | ICD-10-CM | POA: Diagnosis not present

## 2014-09-04 DIAGNOSIS — M899 Disorder of bone, unspecified: Secondary | ICD-10-CM | POA: Diagnosis not present

## 2014-09-04 DIAGNOSIS — Z780 Asymptomatic menopausal state: Secondary | ICD-10-CM | POA: Diagnosis not present

## 2014-09-08 ENCOUNTER — Ambulatory Visit: Payer: Self-pay | Admitting: Oncology

## 2014-09-08 DIAGNOSIS — Z7982 Long term (current) use of aspirin: Secondary | ICD-10-CM | POA: Diagnosis not present

## 2014-09-08 DIAGNOSIS — Z79899 Other long term (current) drug therapy: Secondary | ICD-10-CM | POA: Diagnosis not present

## 2014-09-08 DIAGNOSIS — G20 Parkinson's disease: Secondary | ICD-10-CM | POA: Diagnosis not present

## 2014-09-08 DIAGNOSIS — Z79811 Long term (current) use of aromatase inhibitors: Secondary | ICD-10-CM | POA: Diagnosis not present

## 2014-09-08 DIAGNOSIS — Z9089 Acquired absence of other organs: Secondary | ICD-10-CM | POA: Diagnosis not present

## 2014-09-08 DIAGNOSIS — R5383 Other fatigue: Secondary | ICD-10-CM | POA: Diagnosis not present

## 2014-09-08 DIAGNOSIS — Z170 Estrogen receptor positive status [ER+]: Secondary | ICD-10-CM | POA: Diagnosis not present

## 2014-09-08 DIAGNOSIS — C50919 Malignant neoplasm of unspecified site of unspecified female breast: Secondary | ICD-10-CM | POA: Diagnosis not present

## 2014-09-08 DIAGNOSIS — Z90710 Acquired absence of both cervix and uterus: Secondary | ICD-10-CM | POA: Diagnosis not present

## 2014-09-08 DIAGNOSIS — I059 Rheumatic mitral valve disease, unspecified: Secondary | ICD-10-CM | POA: Diagnosis not present

## 2014-09-08 DIAGNOSIS — E785 Hyperlipidemia, unspecified: Secondary | ICD-10-CM | POA: Diagnosis not present

## 2014-09-08 DIAGNOSIS — R5381 Other malaise: Secondary | ICD-10-CM | POA: Diagnosis not present

## 2014-09-08 DIAGNOSIS — I10 Essential (primary) hypertension: Secondary | ICD-10-CM | POA: Diagnosis not present

## 2014-09-08 DIAGNOSIS — Z9010 Acquired absence of unspecified breast and nipple: Secondary | ICD-10-CM | POA: Diagnosis not present

## 2014-09-09 ENCOUNTER — Other Ambulatory Visit: Payer: Self-pay | Admitting: Internal Medicine

## 2014-09-10 ENCOUNTER — Ambulatory Visit: Payer: Self-pay | Admitting: Pain Medicine

## 2014-09-10 DIAGNOSIS — M5106 Intervertebral disc disorders with myelopathy, lumbar region: Secondary | ICD-10-CM | POA: Diagnosis not present

## 2014-09-10 DIAGNOSIS — IMO0002 Reserved for concepts with insufficient information to code with codable children: Secondary | ICD-10-CM | POA: Diagnosis not present

## 2014-09-11 ENCOUNTER — Other Ambulatory Visit: Payer: Self-pay | Admitting: Internal Medicine

## 2014-09-16 DIAGNOSIS — D1800 Hemangioma unspecified site: Secondary | ICD-10-CM | POA: Diagnosis not present

## 2014-09-16 DIAGNOSIS — L723 Sebaceous cyst: Secondary | ICD-10-CM | POA: Diagnosis not present

## 2014-09-16 DIAGNOSIS — L820 Inflamed seborrheic keratosis: Secondary | ICD-10-CM | POA: Diagnosis not present

## 2014-09-16 DIAGNOSIS — L821 Other seborrheic keratosis: Secondary | ICD-10-CM | POA: Diagnosis not present

## 2014-09-16 DIAGNOSIS — L608 Other nail disorders: Secondary | ICD-10-CM | POA: Diagnosis not present

## 2014-09-17 ENCOUNTER — Ambulatory Visit: Payer: Self-pay | Admitting: Oncology

## 2014-09-28 ENCOUNTER — Ambulatory Visit: Payer: Self-pay | Admitting: Pain Medicine

## 2014-09-28 DIAGNOSIS — M545 Low back pain: Secondary | ICD-10-CM | POA: Diagnosis not present

## 2014-09-28 DIAGNOSIS — M47817 Spondylosis without myelopathy or radiculopathy, lumbosacral region: Secondary | ICD-10-CM | POA: Diagnosis not present

## 2014-09-28 DIAGNOSIS — M25512 Pain in left shoulder: Secondary | ICD-10-CM | POA: Diagnosis not present

## 2014-09-28 DIAGNOSIS — M79606 Pain in leg, unspecified: Secondary | ICD-10-CM | POA: Diagnosis not present

## 2014-09-28 DIAGNOSIS — M79604 Pain in right leg: Secondary | ICD-10-CM | POA: Diagnosis not present

## 2014-09-28 DIAGNOSIS — G894 Chronic pain syndrome: Secondary | ICD-10-CM | POA: Diagnosis not present

## 2014-09-28 DIAGNOSIS — M5416 Radiculopathy, lumbar region: Secondary | ICD-10-CM | POA: Diagnosis not present

## 2014-10-08 ENCOUNTER — Telehealth: Payer: Self-pay | Admitting: Internal Medicine

## 2014-10-08 ENCOUNTER — Emergency Department: Payer: Self-pay | Admitting: Emergency Medicine

## 2014-10-08 DIAGNOSIS — Z79899 Other long term (current) drug therapy: Secondary | ICD-10-CM | POA: Diagnosis not present

## 2014-10-08 DIAGNOSIS — R51 Headache: Secondary | ICD-10-CM | POA: Diagnosis not present

## 2014-10-08 DIAGNOSIS — Z79891 Long term (current) use of opiate analgesic: Secondary | ICD-10-CM | POA: Diagnosis not present

## 2014-10-08 DIAGNOSIS — E871 Hypo-osmolality and hyponatremia: Secondary | ICD-10-CM | POA: Diagnosis not present

## 2014-10-08 DIAGNOSIS — Z791 Long term (current) use of non-steroidal anti-inflammatories (NSAID): Secondary | ICD-10-CM | POA: Diagnosis not present

## 2014-10-08 DIAGNOSIS — I10 Essential (primary) hypertension: Secondary | ICD-10-CM | POA: Diagnosis not present

## 2014-10-08 DIAGNOSIS — Z7982 Long term (current) use of aspirin: Secondary | ICD-10-CM | POA: Diagnosis not present

## 2014-10-08 LAB — BASIC METABOLIC PANEL
Anion Gap: 7 (ref 7–16)
BUN: 16 mg/dL (ref 7–18)
Calcium, Total: 8.5 mg/dL (ref 8.5–10.1)
Chloride: 92 mmol/L — ABNORMAL LOW (ref 98–107)
Co2: 30 mmol/L (ref 21–32)
Creatinine: 0.77 mg/dL (ref 0.60–1.30)
EGFR (Non-African Amer.): >60
GLUCOSE: 84 mg/dL (ref 65–99)
OSMOLALITY: 259 (ref 275–301)
Potassium: 3.8 mmol/L (ref 3.5–5.1)
Sodium: 129 mmol/L — ABNORMAL LOW (ref 136–145)

## 2014-10-08 LAB — TROPONIN I: Troponin-I: 0.02 ng/mL

## 2014-10-08 LAB — CBC
HCT: 35.6 % (ref 35.0–47.0)
HGB: 11.6 g/dL — AB (ref 12.0–16.0)
MCH: 29.5 pg (ref 26.0–34.0)
MCHC: 32.6 g/dL (ref 32.0–36.0)
MCV: 91 fL (ref 80–100)
PLATELETS: 204 10*3/uL (ref 150–440)
RBC: 3.93 10*6/uL (ref 3.80–5.20)
RDW: 13.3 % (ref 11.5–14.5)
WBC: 5.8 10*3/uL (ref 3.6–11.0)

## 2014-10-08 LAB — URINALYSIS, COMPLETE
BACTERIA: NONE SEEN
Bilirubin,UR: NEGATIVE
Blood: NEGATIVE
Glucose,UR: NEGATIVE mg/dL (ref 0–75)
KETONE: NEGATIVE
NITRITE: NEGATIVE
Ph: 6 (ref 4.5–8.0)
Protein: NEGATIVE
RBC,UR: NONE SEEN /HPF (ref 0–5)
Specific Gravity: 1.006 (ref 1.003–1.030)
Squamous Epithelial: <1
WBC UR: NONE SEEN /HPF (ref 0–5)

## 2014-10-08 NOTE — Telephone Encounter (Signed)
Patient Information:  Caller Name: Torri  Phone: [phone]  Patient: Wanda Martinez, Wanda Martinez  Gender: Female  DOB: 10/18/1933  Age: 79 Years  PCP: Einar Pheasant  Office Follow Up:  Does the office need to follow up with this patient?: No  Instructions For The Office: N/A  RN Note:  Triaged for all sxs and needs to be seen by provider now per triage. Office closing soon (after 4pm) and no appts available so advised pt to proceed to [facility] per office policy. Pt states her husband will drive her now to [facility] next to the hospital for eval and she confirms she is familiar with this UC as she has been there before. Agreed to plan.  Symptoms  Reason For Call & Symptoms: Pt reports multiple issues: states she awoke with severe headache last night and then got up to check her BP early this am and it was elevated at 196/86. Since taking her daily BP meds it has steadily come down and now is 130/68. States she still has a headache but it is mild. Also reports sinus pressure/congestion onset approx 2-3 days ago and bladder sxs including frequency, urgency, pain (mostly in pelvic area but some pain around to flank) and night time wetting with onset of these sxs approx 4 days ago. Denies fever. States she has been taking Azo for her bladder and has had a mild improvement in sxs.  Reviewed Health History In EMR: Yes  Reviewed Medications In EMR: Yes  Reviewed Allergies In EMR: Yes  Reviewed Surgeries / Procedures: Yes  Date of Onset of Symptoms: 10/05/2014  Treatments Tried: Azo for bladder sxs; Saline nasal spray  Treatments Tried Worked: No  Guideline(s) Used:  Sinus Pain and Congestion  Urination Pain - Female  High Blood Pressure  Disposition Per Guideline:   Go to Office Now  Reason For Disposition Reached:   Side (flank) or lower back pain present  Advice Given:  Go to UC now  Patient Will Follow Care Advice:  YES

## 2014-10-08 NOTE — Telephone Encounter (Signed)
FYI

## 2014-10-08 NOTE — Telephone Encounter (Signed)
Pt called and left a message that she had a headache and BP-196/86. Pt was called to ask about symptoms and transferred to [facility]

## 2014-10-09 LAB — TROPONIN I: Troponin-I: <0.02 ng/mL

## 2014-10-09 NOTE — Telephone Encounter (Signed)
Pt called today to follow up, she went to walk in clinic but BP on arrival was 208/90 so they transported her to ED. She was evaluated there and released with a lower Bp, unsure what reading was. Has not checked her BP today, states she "feels better today", no headache like yesterday. Needs ED follow up. Appt scheduled 10/12/14. Sent for records from [facility]

## 2014-10-12 ENCOUNTER — Ambulatory Visit (INDEPENDENT_AMBULATORY_CARE_PROVIDER_SITE_OTHER): Payer: Medicare Other | Admitting: Internal Medicine

## 2014-10-12 ENCOUNTER — Encounter: Payer: Self-pay | Admitting: Internal Medicine

## 2014-10-12 VITALS — BP 130/90 | HR 59 | Temp 98.6°F | Ht 64.0 in | Wt 180.5 lb

## 2014-10-12 DIAGNOSIS — E871 Hypo-osmolality and hyponatremia: Secondary | ICD-10-CM

## 2014-10-12 DIAGNOSIS — R002 Palpitations: Secondary | ICD-10-CM

## 2014-10-12 DIAGNOSIS — I10 Essential (primary) hypertension: Secondary | ICD-10-CM

## 2014-10-12 DIAGNOSIS — R51 Headache: Secondary | ICD-10-CM | POA: Diagnosis not present

## 2014-10-12 DIAGNOSIS — R519 Headache, unspecified: Secondary | ICD-10-CM

## 2014-10-12 LAB — TSH: TSH: 1.77 u[IU]/mL (ref 0.35–4.50)

## 2014-10-12 LAB — SODIUM: Sodium: 128 mEq/L — ABNORMAL LOW (ref 135–145)

## 2014-10-12 MED ORDER — AMLODIPINE BESYLATE 2.5 MG PO TABS: 2.5000 mg | ORAL_TABLET | Freq: Every day | ORAL | Status: DC

## 2014-10-12 NOTE — Progress Notes (Signed)
Pre visit review using our clinic review tool, if applicable. No additional management support is needed unless otherwise documented below in the visit note. 

## 2014-10-15 ENCOUNTER — Other Ambulatory Visit (INDEPENDENT_AMBULATORY_CARE_PROVIDER_SITE_OTHER): Payer: Medicare Other

## 2014-10-15 DIAGNOSIS — E785 Hyperlipidemia, unspecified: Secondary | ICD-10-CM | POA: Diagnosis not present

## 2014-10-15 DIAGNOSIS — I10 Essential (primary) hypertension: Secondary | ICD-10-CM | POA: Diagnosis not present

## 2014-10-15 LAB — BASIC METABOLIC PANEL
BUN: 24 mg/dL — ABNORMAL HIGH (ref 6–23)
CALCIUM: 9.1 mg/dL (ref 8.4–10.5)
CO2: 30 mEq/L (ref 19–32)
CREATININE: 1.1 mg/dL (ref 0.4–1.2)
Chloride: 98 mEq/L (ref 96–112)
GFR: 54.01 mL/min — ABNORMAL LOW (ref >60.00)
GLUCOSE: 62 mg/dL — AB (ref 70–99)
Potassium: 4 mEq/L (ref 3.5–5.1)
SODIUM: 131 meq/L — AB (ref 135–145)

## 2014-10-15 LAB — HEPATIC FUNCTION PANEL
ALBUMIN: 3.5 g/dL (ref 3.5–5.2)
ALK PHOS: 57 U/L (ref 39–117)
ALT: 10 U/L (ref 0–35)
AST: 25 U/L (ref 0–37)
BILIRUBIN TOTAL: 0.5 mg/dL (ref 0.2–1.2)
Bilirubin, Direct: 0 mg/dL (ref 0.0–0.3)
Total Protein: 7 g/dL (ref 6.0–8.3)

## 2014-10-15 LAB — LIPID PANEL
Cholesterol: 151 mg/dL (ref 0–200)
HDL: 45.6 mg/dL (ref >39.00)
LDL CALC: 85 mg/dL (ref 0–99)
NONHDL: 105.4
Total CHOL/HDL Ratio: 3
Triglycerides: 102 mg/dL (ref 0.0–149.0)
VLDL: 20.4 mg/dL (ref 0.0–40.0)

## 2014-10-16 ENCOUNTER — Other Ambulatory Visit: Payer: Self-pay | Admitting: Internal Medicine

## 2014-10-16 DIAGNOSIS — E871 Hypo-osmolality and hyponatremia: Secondary | ICD-10-CM

## 2014-10-16 NOTE — Progress Notes (Signed)
Order placed for f/u sodium.  ?

## 2014-10-18 ENCOUNTER — Encounter: Payer: Self-pay | Admitting: Internal Medicine

## 2014-10-18 DIAGNOSIS — R519 Headache, unspecified: Secondary | ICD-10-CM | POA: Insufficient documentation

## 2014-10-18 DIAGNOSIS — R51 Headache: Secondary | ICD-10-CM

## 2014-10-18 NOTE — Progress Notes (Signed)
Subjective:    Patient ID: Wanda Martinez, female    DOB: May 17, 1935, 79 y.o.   MRN: [id number]  HPI 79 year old female with past history of recurrent breast cancer s/p mastectomy, hypertension, hyperlipidemia and alopecia who comes in today for an ER follow up.  Was recently seen for elevated blood pressure and headache.  She reports she developed headache that was persistent.  Blood pressure elevation.  Went to acute care and they transferred her over to the ER.  W/u there including negative head CT.  Labs unrevealing except for low sodium.  Since her ER visit, blood pressure averaging 132-167/70-90s.  She also reports some increased drainage in her throat and cough.  Some sinus congestion and drainage.  Using saline.  No sob.  No chest pain.    Past Medical History  Diagnosis Date  . Breast cancer     Masectomy - left - 1986   . GERD (gastroesophageal reflux disease)   . Hiatal hernia   . HTN (hypertension)   . Chronic cystitis   . Urinary incontinence     mixed   . Arthritis   . Anemia     Current Outpatient Prescriptions on File Prior to Visit  Medication Sig Dispense Refill  . aspirin 81 MG EC tablet Take 81 mg by mouth daily as needed.      . Calcium Carbonate (CALCIUM 600) 1500 MG TABS Take 1 tablet by mouth daily.      . carbidopa-levodopa (SINEMET IR) 25-100 MG per tablet Take 1 tablet by mouth 3 (three) times daily.    . citalopram (CELEXA) 10 MG tablet Take 20 mg by mouth daily.    . fish oil-omega-3 fatty acids 1000 MG capsule Take 2 g by mouth 2 (two) times daily.    Marland Kitchen gabapentin (NEURONTIN) 300 MG capsule Take 300 mg by mouth 2 (two) times daily.    . Glucosamine-Chondroit-Vit C-Mn (GLUCOSAMINE CHONDROITIN COMPLX) CAPS Take 2 capsules by mouth daily.      . hydrochlorothiazide (HYDRODIURIL) 25 MG tablet TAKE 1 TABLET BY MOUTH ONCE DAILY. 30 tablet 5  . HYDROcodone-acetaminophen (NORCO) 5-325 MG per tablet Take 0.5-1 tablets by mouth daily.      Marland Kitchen losartan (COZAAR)  100 MG tablet TAKE 1 TABLET BY MOUTH ONCE DAILY. 90 tablet 1  . meloxicam (MOBIC) 7.5 MG tablet Take 1 tablet (7.5 mg total) by mouth daily. 30 tablet 2  . Methenamine-Sodium Salicylate (CYSTEX PO) Take by mouth.    . metoprolol succinate (TOPROL-XL) 25 MG 24 hr tablet Take 1 tablet (25 mg total) by mouth 2 (two) times daily. 180 tablet 3  . Multiple Vitamin (MULTIVITAMIN) tablet Take 1 tablet by mouth daily.    Marland Kitchen omeprazole (PRILOSEC) 20 MG capsule TAKE 1 CAPSULE BY MOUTH TWICE DAILY FOR REFLUX. 60 capsule 3  . pravastatin (PRAVACHOL) 10 MG tablet TAKE 1 TABLET BY MOUTH AT BEDTIME FOR CHOLESTEROL 30 tablet 5   No current facility-administered medications on file prior to visit.    Review of Systems Patient denies any headache now.  Some sinus congestion as outlined.  No chest pain, tightness or palpitations.  No increased shortness of breath.  Some cough.  Breathing stable.   No nausea or vomiting.    Eating and drinking.   No bowel change, such as diarrhea, constipation, BRBPR or melana.  Blood pressures as outlined.  Overall feels better.               Objective:  Physical Exam  Filed Vitals:   10/12/14 1141  BP: 130/90  Pulse: 59  Temp: 98.6 F (37 C)   Blood pressure recheck:  138-74/66-22  79 year old female in no acute distress.   HEENT:  Nares- clear.  Oropharynx - without lesions. NECK:  Supple.  Nontender.  No audible bruit.  HEART:  Appears to be regular. LUNGS:  No crackles or wheezing audible.  Respirations even and unlabored.  RADIAL PULSE:  Equal bilaterally.    ABDOMEN:  Soft, nontender.  Bowel sounds present and normal.  No audible abdominal bruit.    EXTREMITIES:  No increased edema present.  DP pulses palpable and equal bilaterally.   No increased erythema or warmth.       Assessment & Plan:  PULMONARY.  Stable.  Some cough and congestion.  Robitussin as directed.  Saline nasal spray and nasacort as directed.  Follow.    Hyponatremia Found to have low  sodium in ER.  Recheck today.  May need to adjust dosing of HCTZ.   - Sodium  Palpitations Overall relatively stable.  Follow.   - TSH  Essential hypertension Blood pressure as outlined.  Running higher.  Add amlodipine 2.5mg  q day.  Will recheck sodium.  Will probably need to adjust dose of HCTz.    Headache, unspecified headache type Not a significant issue now.  Follow.   HEALTH MAINTENANCE.  Physical 10/28/13. .  Colonoscopy as outlined.  Mammograms through oncology.   I spent 25 minutes with the patient and more than 50% of the time was spent in consultation regarding the above.

## 2014-10-26 ENCOUNTER — Other Ambulatory Visit (INDEPENDENT_AMBULATORY_CARE_PROVIDER_SITE_OTHER): Payer: Medicare Other

## 2014-10-26 ENCOUNTER — Other Ambulatory Visit: Payer: No Typology Code available for payment source

## 2014-10-26 DIAGNOSIS — E871 Hypo-osmolality and hyponatremia: Secondary | ICD-10-CM

## 2014-10-26 NOTE — Addendum Note (Signed)
Addended by: Johnsie Cancel on: 10/26/2014 03:27 PM   Modules accepted: Orders

## 2014-10-27 LAB — SODIUM: Sodium: 132 mEq/L — ABNORMAL LOW (ref 135–145)

## 2014-10-28 ENCOUNTER — Encounter: Payer: Self-pay | Admitting: *Deleted

## 2014-11-03 ENCOUNTER — Other Ambulatory Visit: Payer: Self-pay | Admitting: Internal Medicine

## 2014-11-04 ENCOUNTER — Telehealth: Payer: Self-pay | Admitting: *Deleted

## 2014-11-04 NOTE — Telephone Encounter (Signed)
Please call and notify pt that she does not need to come in for fasting labs on Friday.  With the recheck on her sodium, fasting labs were done.  I will see her at her 11/09/14 appt.  Thanks.

## 2014-11-04 NOTE — Telephone Encounter (Signed)
Pt is coming in Friday what labs and dx?

## 2014-11-04 NOTE — Telephone Encounter (Signed)
Pt was notified.  

## 2014-11-05 ENCOUNTER — Ambulatory Visit: Payer: Self-pay | Admitting: Pain Medicine

## 2014-11-05 DIAGNOSIS — M545 Low back pain: Secondary | ICD-10-CM | POA: Diagnosis not present

## 2014-11-05 DIAGNOSIS — G894 Chronic pain syndrome: Secondary | ICD-10-CM | POA: Diagnosis not present

## 2014-11-05 DIAGNOSIS — M79606 Pain in leg, unspecified: Secondary | ICD-10-CM | POA: Diagnosis not present

## 2014-11-05 DIAGNOSIS — M47817 Spondylosis without myelopathy or radiculopathy, lumbosacral region: Secondary | ICD-10-CM | POA: Diagnosis not present

## 2014-11-05 DIAGNOSIS — Z853 Personal history of malignant neoplasm of breast: Secondary | ICD-10-CM | POA: Diagnosis not present

## 2014-11-05 DIAGNOSIS — M79604 Pain in right leg: Secondary | ICD-10-CM | POA: Diagnosis not present

## 2014-11-05 DIAGNOSIS — I10 Essential (primary) hypertension: Secondary | ICD-10-CM | POA: Diagnosis not present

## 2014-11-05 DIAGNOSIS — G20 Parkinson's disease: Secondary | ICD-10-CM | POA: Diagnosis not present

## 2014-11-05 DIAGNOSIS — E785 Hyperlipidemia, unspecified: Secondary | ICD-10-CM | POA: Diagnosis not present

## 2014-11-05 DIAGNOSIS — M5416 Radiculopathy, lumbar region: Secondary | ICD-10-CM | POA: Diagnosis not present

## 2014-11-06 ENCOUNTER — Other Ambulatory Visit: Payer: No Typology Code available for payment source

## 2014-11-06 DIAGNOSIS — Z23 Encounter for immunization: Secondary | ICD-10-CM | POA: Diagnosis not present

## 2014-11-09 ENCOUNTER — Encounter: Payer: Self-pay | Admitting: Internal Medicine

## 2014-11-09 ENCOUNTER — Ambulatory Visit (INDEPENDENT_AMBULATORY_CARE_PROVIDER_SITE_OTHER): Payer: Medicare Other | Admitting: Internal Medicine

## 2014-11-09 VITALS — BP 138/78 | HR 60 | Temp 98.4°F | Ht 63.5 in | Wt 181.5 lb

## 2014-11-09 DIAGNOSIS — E7800 Pure hypercholesterolemia, unspecified: Secondary | ICD-10-CM

## 2014-11-09 DIAGNOSIS — E669 Obesity, unspecified: Secondary | ICD-10-CM | POA: Diagnosis not present

## 2014-11-09 DIAGNOSIS — M545 Low back pain: Secondary | ICD-10-CM | POA: Diagnosis not present

## 2014-11-09 DIAGNOSIS — R300 Dysuria: Secondary | ICD-10-CM

## 2014-11-09 DIAGNOSIS — N390 Urinary tract infection, site not specified: Secondary | ICD-10-CM | POA: Diagnosis not present

## 2014-11-09 DIAGNOSIS — D649 Anemia, unspecified: Secondary | ICD-10-CM

## 2014-11-09 DIAGNOSIS — R251 Tremor, unspecified: Secondary | ICD-10-CM

## 2014-11-09 DIAGNOSIS — N760 Acute vaginitis: Secondary | ICD-10-CM

## 2014-11-09 DIAGNOSIS — M542 Cervicalgia: Secondary | ICD-10-CM | POA: Diagnosis not present

## 2014-11-09 DIAGNOSIS — I10 Essential (primary) hypertension: Secondary | ICD-10-CM | POA: Diagnosis not present

## 2014-11-09 DIAGNOSIS — C50919 Malignant neoplasm of unspecified site of unspecified female breast: Secondary | ICD-10-CM

## 2014-11-09 LAB — POCT URINALYSIS DIPSTICK
BILIRUBIN UA: NEGATIVE
Blood, UA: NEGATIVE
Glucose, UA: NEGATIVE
Nitrite, UA: NEGATIVE
PROTEIN UA: NEGATIVE
Spec Grav, UA: 1.015
Urobilinogen, UA: 1
pH, UA: 7

## 2014-11-09 MED ORDER — CEFUROXIME AXETIL 250 MG PO TABS: 250.0000 mg | ORAL_TABLET | Freq: Two times a day (BID) | ORAL | Status: DC

## 2014-11-09 NOTE — Progress Notes (Signed)
Pre visit review using our clinic review tool, if applicable. No additional management support is needed unless otherwise documented below in the visit note. 

## 2014-11-09 NOTE — Progress Notes (Signed)
Subjective:    Patient ID: Wanda Martinez, female    DOB: 04-12-1935, 79 y.o.   MRN: [id number]  Urinary Frequency  Associated symptoms include frequency.  79 year old female with past history of recurrent breast cancer s/p mastectomy, hypertension, hyperlipidemia and alopecia who comes in today to follow up on these issues as well as for a complete physical exam.   She was recently diagnosed with recurrent breast cancer on the right.  Is s/p right mastectomy.  Did have one lymph node positive.  Received chemotherapy and XRT.  States she is eating and drinking.  No nausea.  Breathing stable.  Off evista.  Bowels moving.   Was having increased tremors.  Saw Dr Melrose Nakayama.  Originally placed on neurontin.  Had f/u.  Was started on sinemet for treatment of parkinsons.  Overall feels better.  She feels this is helping.  still having some discomfort in her neck and shoulders.  Being followed at pain clinic.  Is s/p injection.  Helped some.  Is being followed at the cancer center.  States she is up to date.  She does report over the last week, she has noticed some dysuria, nocturia and urinary incontinence.  Some vaginal burning.  Using monistat.  Drinking cranberry juice and taking AZO.        Past Medical History  Diagnosis Date  . Breast cancer     Masectomy - left - 1986   . GERD (gastroesophageal reflux disease)   . Hiatal hernia   . HTN (hypertension)   . Chronic cystitis   . Urinary incontinence     mixed   . Arthritis   . Anemia     Current Outpatient Prescriptions on File Prior to Visit  Medication Sig Dispense Refill  . amLODipine (NORVASC) 2.5 MG tablet Take 1 tablet (2.5 mg total) by mouth daily. 30 tablet 2  . aspirin 81 MG EC tablet Take 81 mg by mouth daily as needed.      . Calcium Carbonate (CALCIUM 600) 1500 MG TABS Take 1 tablet by mouth daily.      . carbidopa-levodopa (SINEMET IR) 25-100 MG per tablet Take 1 tablet by mouth 3 (three) times daily.    . citalopram (CELEXA)  10 MG tablet Take 20 mg by mouth daily.    . fish oil-omega-3 fatty acids 1000 MG capsule Take 2 g by mouth 2 (two) times daily.    Marland Kitchen gabapentin (NEURONTIN) 300 MG capsule Take 300 mg by mouth 2 (two) times daily.    . Glucosamine-Chondroit-Vit C-Mn (GLUCOSAMINE CHONDROITIN COMPLX) CAPS Take 2 capsules by mouth daily.      . hydrochlorothiazide (HYDRODIURIL) 25 MG tablet TAKE 1 TABLET BY MOUTH ONCE DAILY. 30 tablet 5  . HYDROcodone-acetaminophen (NORCO) 5-325 MG per tablet Take 0.5-1 tablets by mouth daily.      Marland Kitchen letrozole (FEMARA) 2.5 MG tablet     . losartan (COZAAR) 100 MG tablet TAKE 1 TABLET BY MOUTH ONCE DAILY. 90 tablet 1  . meloxicam (MOBIC) 7.5 MG tablet Take 1 tablet (7.5 mg total) by mouth daily. 30 tablet 2  . Methenamine-Sodium Salicylate (CYSTEX PO) Take by mouth.    . metoprolol succinate (TOPROL-XL) 25 MG 24 hr tablet Take 1 tablet (25 mg total) by mouth 2 (two) times daily. 180 tablet 3  . Multiple Vitamin (MULTIVITAMIN) tablet Take 1 tablet by mouth daily.    Marland Kitchen omeprazole (PRILOSEC) 20 MG capsule TAKE 1 CAPSULE BY MOUTH TWICE DAILY FOR REFLUX. Rosa  capsule 3  . pravastatin (PRAVACHOL) 10 MG tablet TAKE 1 TABLET BY MOUTH AT BEDTIME FOR CHOLESTEROL 30 tablet 1   No current facility-administered medications on file prior to visit.    Review of Systems  Genitourinary: Positive for frequency.  Patient denies any headache. No dizziness reported.   Some minimal nasal congestion and sinus pressure.   No chest pain, tightness or palpitations.  No increased shortness of breath or cough.  Breathing stable.   No nausea or vomiting.    Eating and drinking.   No bowel change, such as diarrhea, constipation, BRBPR or melana.  Tremors -doing better since starting sinemet.   Feels better.  Some neck and shoulder pain as outlined.  Handling stress relatively well.  Blood pressures have been better.  Blood pressures 136-150/70s.   Urinary and vaginal symptoms as outlined.                Objective:   Physical Exam  Filed Vitals:   11/09/14 1329  BP: 138/78  Pulse: 60  Temp: 98.4 F (36.9 C)   Blood pressure recheck:  32/5  79 year old female in no acute distress.   HEENT:  Nares- clear.  Oropharynx - without lesions. NECK:  Supple.  Nontender.  No audible bruit.  HEART:  Appears to be regular. LUNGS:  No crackles or wheezing audible.  Respirations even and unlabored.  RADIAL PULSE:  Equal bilaterally.    BREASTS:  No nipple discharge or nipple retraction present.  Could not appreciate any distinct nodules or axillary adenopathy. S/p left mastectomy.  Right implant in place.   ABDOMEN:  Soft, nontender.  Bowel sounds present and normal.  No audible abdominal bruit.  GU:  Normal external genitalia.  Vaginal vault without lesions.  Could not appreciate any adnexal masses or tenderness.  Wet prep obtained.   RECTAL:  Not performed.   EXTREMITIES:  No increased edema present.  DP pulses palpable and equal bilaterally.      Assessment & Plan:  1. Urinary tract infection without hematuria, site unspecified - POCT urinalysis dipstick - CULTURE, URINE COMPREHENSIVE Urine dip c/w uti.  Treat with ceftin as directed.  Follow.   2. Vaginitis Wet prep obtained.  Await results.    3. Obesity (BMI 30-39.9) Diet and exercise.    4. Essential hypertension Blood pressure doing better.  Follow.  Same medications.   - Basic metabolic panel; Future  5. Dysuria Treat with ceftin as outlined.  Follow.   6. Low back pain, unspecified back pain laterality, with sciatica presence unspecified Being followed at pain clinic.  Injections have helped.    7. Neck pain Being followed at pain clinic.  C-spine xray - degenerative disc disease.    8. Tremor Seeing Dr Melrose Nakayama.  See his note for details.  Started on sinemet.  Better.  Concern over parkinsons.    9. Anemia, unspecified anemia type Colonoscopy 03/01/12 - diverticulosis and internal hemorrhoids.   - CBC with  Differential; Future  10. Breast cancer, unspecified laterality S/p mastectomy and chemotherapy and XRT.  Recurrent breast cancer on the right.  Continues to f/u with Dr Grayland Ormond.  Seeing Dr Pat Patrick.    11. Hypercholesterolemia On pravastatin.  Low cholesterol diet.   - Lipid panel; Future - Hepatic function panel; Future  12. ABDOMINAL PAIN.  Previous pelvic ultrasound unrevealing.  CT unrevealing.  Colonoscopy 03/01/12 revealed diverticulosis and internal hemorrhoids.  No significant abdominal pain reported today.  Treat uti.  13. PULMONARY.  Stable.    HEALTH MAINTENANCE.  Physical today .  Colonoscopy as outlined.  Mammograms through oncology.   I spent 25 minutes with the patient and more than 50% of the time was spent in consultation regarding the above.

## 2014-11-11 LAB — WET PREP BY MOLECULAR PROBE
Candida species: NEGATIVE
Gardnerella vaginalis: NEGATIVE
TRICHOMONAS VAG: NEGATIVE

## 2014-11-12 ENCOUNTER — Other Ambulatory Visit: Payer: Self-pay | Admitting: Internal Medicine

## 2014-11-12 ENCOUNTER — Encounter: Payer: Self-pay | Admitting: Internal Medicine

## 2014-11-12 DIAGNOSIS — R300 Dysuria: Secondary | ICD-10-CM | POA: Insufficient documentation

## 2014-11-12 MED ORDER — AMOXICILLIN 500 MG PO CAPS: 500.0000 mg | ORAL_CAPSULE | Freq: Two times a day (BID) | ORAL | Status: DC

## 2014-11-12 NOTE — Progress Notes (Signed)
Amoxicillin sent if to pharmacy.  Pt aware of culture results and rx sent in.  Will stop ceftin.

## 2014-11-13 LAB — CULTURE, URINE COMPREHENSIVE: Colony Count: 7000

## 2014-12-01 ENCOUNTER — Encounter: Payer: Self-pay | Admitting: Internal Medicine

## 2014-12-03 ENCOUNTER — Other Ambulatory Visit: Payer: Self-pay | Admitting: Internal Medicine

## 2014-12-23 DIAGNOSIS — I10 Essential (primary) hypertension: Secondary | ICD-10-CM | POA: Diagnosis not present

## 2014-12-23 DIAGNOSIS — M542 Cervicalgia: Secondary | ICD-10-CM | POA: Diagnosis not present

## 2014-12-23 DIAGNOSIS — G20 Parkinson's disease: Secondary | ICD-10-CM | POA: Diagnosis not present

## 2014-12-23 DIAGNOSIS — F329 Major depressive disorder, single episode, unspecified: Secondary | ICD-10-CM | POA: Diagnosis not present

## 2014-12-30 ENCOUNTER — Other Ambulatory Visit: Payer: Self-pay | Admitting: Internal Medicine

## 2015-01-25 DIAGNOSIS — R251 Tremor, unspecified: Secondary | ICD-10-CM | POA: Diagnosis not present

## 2015-01-25 DIAGNOSIS — F328 Other depressive episodes: Secondary | ICD-10-CM | POA: Diagnosis not present

## 2015-01-25 DIAGNOSIS — M542 Cervicalgia: Secondary | ICD-10-CM | POA: Diagnosis not present

## 2015-01-25 DIAGNOSIS — E669 Obesity, unspecified: Secondary | ICD-10-CM | POA: Diagnosis not present

## 2015-02-01 ENCOUNTER — Other Ambulatory Visit: Payer: Self-pay | Admitting: Internal Medicine

## 2015-02-02 ENCOUNTER — Ambulatory Visit (INDEPENDENT_AMBULATORY_CARE_PROVIDER_SITE_OTHER): Payer: Medicare Other | Admitting: Internal Medicine

## 2015-02-02 ENCOUNTER — Encounter: Payer: Self-pay | Admitting: Internal Medicine

## 2015-02-02 VITALS — BP 134/78 | HR 69 | Temp 98.9°F | Ht 63.5 in | Wt 185.1 lb

## 2015-02-02 DIAGNOSIS — C50919 Malignant neoplasm of unspecified site of unspecified female breast: Secondary | ICD-10-CM | POA: Diagnosis not present

## 2015-02-02 DIAGNOSIS — R251 Tremor, unspecified: Secondary | ICD-10-CM | POA: Diagnosis not present

## 2015-02-02 DIAGNOSIS — E7800 Pure hypercholesterolemia, unspecified: Secondary | ICD-10-CM

## 2015-02-02 DIAGNOSIS — I10 Essential (primary) hypertension: Secondary | ICD-10-CM | POA: Diagnosis not present

## 2015-02-02 DIAGNOSIS — D649 Anemia, unspecified: Secondary | ICD-10-CM | POA: Diagnosis not present

## 2015-02-02 DIAGNOSIS — Z0000 Encounter for general adult medical examination without abnormal findings: Secondary | ICD-10-CM | POA: Diagnosis not present

## 2015-02-02 DIAGNOSIS — J329 Chronic sinusitis, unspecified: Secondary | ICD-10-CM | POA: Diagnosis not present

## 2015-02-02 DIAGNOSIS — E669 Obesity, unspecified: Secondary | ICD-10-CM | POA: Diagnosis not present

## 2015-02-02 MED ORDER — AMOXICILLIN 875 MG PO TABS: 875.0000 mg | ORAL_TABLET | Freq: Two times a day (BID) | ORAL | Status: DC

## 2015-02-02 NOTE — Progress Notes (Signed)
Pre visit review using our clinic review tool, if applicable. No additional management support is needed unless otherwise documented below in the visit note. 

## 2015-02-02 NOTE — Patient Instructions (Signed)
Saline nasal spray - flush nose at least 2-3x/day  nasacort nasal spray - 2 sprays each nostril one time per day.  Do this in the evening.    mucinex in the am and robitussin in the evening.  

## 2015-02-03 ENCOUNTER — Encounter: Payer: Self-pay | Admitting: *Deleted

## 2015-02-07 ENCOUNTER — Telehealth: Payer: Self-pay | Admitting: Internal Medicine

## 2015-02-07 ENCOUNTER — Encounter: Payer: Self-pay | Admitting: Internal Medicine

## 2015-02-07 DIAGNOSIS — J329 Chronic sinusitis, unspecified: Secondary | ICD-10-CM | POA: Insufficient documentation

## 2015-02-07 HISTORY — DX: Chronic sinusitis, unspecified: J32.9

## 2015-02-07 NOTE — Assessment & Plan Note (Signed)
Symptoms persistent as outlined.  Amoxicillin as directed.  [place] nasal spray and nasacort as directed.  mucinex in the am and robitussin in the evening.  Follow.

## 2015-02-07 NOTE — Assessment & Plan Note (Signed)
Diet and exercise.   

## 2015-02-07 NOTE — Assessment & Plan Note (Signed)
Physical 11/09/14.  Colonoscopy 03/01/12.  Mammogram through oncology.

## 2015-02-07 NOTE — Assessment & Plan Note (Signed)
Colonoscopy 03/01/12.  Followed by oncology.

## 2015-02-07 NOTE — Assessment & Plan Note (Signed)
S/p mastectomy, chemotherapy and XRT.  Recurrent breast cancer - right.  Continues f/u with Dr Grayland Ormond.  Recent breast biopsy negative.  Has been released by Dr Pat Patrick.  Obtain results of last mammogram.

## 2015-02-07 NOTE — Assessment & Plan Note (Signed)
Low cholesterol diet and exercise.  On pravastatin.  Follow lipid panel and liver function tests.   

## 2015-02-07 NOTE — Assessment & Plan Note (Signed)
Blood pressure has been under good control.  Same medication regimen.  Follow pressures.   Follow metabolic panel.   

## 2015-02-07 NOTE — Progress Notes (Signed)
Patient ID: Wanda Martinez, female   DOB: 1935-08-09, 80 y.o.   MRN: [id number]   Subjective:    Patient ID: Wanda Martinez, female    DOB: [birth year], 80 y.o.   MRN: [id number]  HPI  Patient here for a scheduled follow up.  States she has been having problems with increased head congestion and post nasal drainage.  Increased green, yellow and blood tinged mucus production.  No significant cough.  Using saline.  Has taken some mucinex D.  Symptoms persistent over the last two weeks.   Blood pressure has been averaging 130s/70s.  Has been released by Dr Pat Patrick.  Followed at the cancer center.  Has f/u at the cancer center next month.    Past Medical History  Diagnosis Date  . Breast cancer     Masectomy - left - 1986   . GERD (gastroesophageal reflux disease)   . Hiatal hernia   . HTN (hypertension)   . Chronic cystitis   . Urinary incontinence     mixed   . Arthritis   . Anemia     Current Outpatient Prescriptions on File Prior to Visit  Medication Sig Dispense Refill  . amLODipine (NORVASC) 2.5 MG tablet TAKE 1 TABLET BY MOUTH ONCE DAILY. 30 tablet 5  . aspirin 81 MG EC tablet Take 81 mg by mouth daily as needed.      . Calcium Carbonate (CALCIUM 600) 1500 MG TABS Take 1 tablet by mouth daily.      . carbidopa-levodopa (SINEMET IR) 25-100 MG per tablet Take 1 tablet by mouth 3 (three) times daily.    . citalopram (CELEXA) 10 MG tablet Take 20 mg by mouth daily.    . fish oil-omega-3 fatty acids 1000 MG capsule Take 2 g by mouth 2 (two) times daily.    Marland Kitchen gabapentin (NEURONTIN) 300 MG capsule Take 300 mg by mouth 2 (two) times daily.    . Glucosamine-Chondroit-Vit C-Mn (GLUCOSAMINE CHONDROITIN COMPLX) CAPS Take 2 capsules by mouth daily.      . hydrochlorothiazide (HYDRODIURIL) 25 MG tablet TAKE 1 TABLET BY MOUTH ONCE DAILY. 30 tablet 6  . HYDROcodone-acetaminophen (NORCO) 5-325 MG per tablet Take 0.5-1 tablets by mouth daily.      Marland Kitchen letrozole (FEMARA) 2.5 MG tablet     .  losartan (COZAAR) 100 MG tablet TAKE 1 TABLET BY MOUTH ONCE DAILY. 90 tablet 1  . meloxicam (MOBIC) 7.5 MG tablet Take 1 tablet (7.5 mg total) by mouth daily. 30 tablet 2  . Methenamine-Sodium Salicylate (CYSTEX PO) Take by mouth.    . metoprolol succinate (TOPROL-XL) 25 MG 24 hr tablet TAKE 1 TABLET BY MOUTH TWICE DAILY 60 tablet 3  . Multiple Vitamin (MULTIVITAMIN) tablet Take 1 tablet by mouth daily.    Marland Kitchen omeprazole (PRILOSEC) 20 MG capsule TAKE (1) CAPSULE BY MOUTH TWICE DAILY FOR REFLUX 60 capsule 3  . pravastatin (PRAVACHOL) 10 MG tablet TAKE 1 TABLET BY MOUTH AT BEDTIME FOR CHOLESTEROL 30 tablet 3  . traZODone (DESYREL) 50 MG tablet TAKE ONE TABLET BY MOUTH AT BEDTIME. 30 tablet 2   No current facility-administered medications on file prior to visit.    Review of Systems  Constitutional: Negative for appetite change and unexpected weight change.  HENT: Positive for congestion, postnasal drip, sinus pressure and sore throat (better now. ).   Respiratory: Negative for cough, chest tightness and shortness of breath.   Cardiovascular: Negative for chest pain, palpitations and leg swelling.  Gastrointestinal: Negative for  nausea, vomiting, abdominal pain and diarrhea.  Genitourinary: Negative for dysuria and difficulty urinating.  Skin: Negative for color change and rash.  Neurological: Negative for dizziness, light-headedness and headaches.  Hematological: Negative for adenopathy. Does not bruise/bleed easily.  Psychiatric/Behavioral: Negative for behavioral problems and agitation.       Objective:    Physical Exam  Constitutional: She appears well-developed and well-nourished. No distress.  HENT:  Nose: Nose normal.  Mouth/Throat: Oropharynx is clear and moist.  Neck: Neck supple. No thyromegaly present.  Cardiovascular: Normal rate and regular rhythm.   Pulmonary/Chest: Breath sounds normal. No respiratory distress. She has no wheezes.  Abdominal: Soft. Bowel sounds are  normal. There is no tenderness.  Musculoskeletal: She exhibits no edema or tenderness.  Lymphadenopathy:    She has no cervical adenopathy.  Skin: No rash noted. No erythema.  Psychiatric: She has a normal mood and affect. Her behavior is normal.    BP 134/78 mmHg  Pulse 69  Temp(Src) 98.9 F (37.2 C) (Oral)  Ht 5' 3.5" (1.613 m)  Wt 185 lb 2 oz (83.972 kg)  BMI 32.27 kg/m2  SpO2 97%  LMP 12/18/1981 Wt Readings from Last 3 Encounters:  02/02/15 185 lb 2 oz (83.972 kg)  11/09/14 181 lb 8 oz (82.328 kg)  10/12/14 180 lb 8 oz (81.874 kg)     Lab Results  Component Value Date   WBC 7.0 12/23/2013   HGB 12.4 12/23/2013   HCT 37.3 12/23/2013   PLT 243.0 12/23/2013   GLUCOSE 62* 10/15/2014   CHOL 151 10/15/2014   TRIG 102.0 10/15/2014   HDL 45.60 10/15/2014   LDLDIRECT 139.6 08/04/2013   LDLCALC 85 10/15/2014   ALT 10 10/15/2014   AST 25 10/15/2014   NA 132* 10/26/2014   K 4.0 10/15/2014   CL 98 10/15/2014   CREATININE 1.1 10/15/2014   BUN 24* 10/15/2014   CO2 30 10/15/2014   TSH 1.77 10/12/2014       Assessment & Plan:   Problem List Items Addressed This Visit    Anemia    Colonoscopy 03/01/12.  Followed by oncology.        Breast cancer    S/p mastectomy, chemotherapy and XRT.  Recurrent breast cancer - right.  Continues f/u with Dr Grayland Ormond.  Recent breast biopsy negative.  Has been released by Dr Pat Patrick.  Obtain results of last mammogram.       Essential hypertension - Primary    Blood pressure has been under good control.  Same medication regimen.  Follow pressures.  Follow metabolic panel.        Health care maintenance    Physical 11/09/14.  Colonoscopy 03/01/12.  Mammogram through oncology.        Hypercholesterolemia    Low cholesterol diet and exercise.  On pravastatin.  Follow lipid panel and liver function tests.        Obesity (BMI 30-39.9)    Diet and exercise.        Sinusitis    Symptoms persistent as outlined.  Amoxicillin as directed.   [place] nasal spray and nasacort as directed.  mucinex in the am and robitussin in the evening.  Follow.        Relevant Medications   amoxicillin (AMOXIL) tablet   Tremor    Seeing Dr Melrose Nakayama.  See his notes for details.  On neurontin.  Also on sinamet.          I spent 25 minutes with the patient and more  than 50% of the time was spent in consultation regarding the above.     Einar Pheasant, MD

## 2015-02-07 NOTE — Assessment & Plan Note (Signed)
Seeing Dr Melrose Nakayama.  See his notes for details.  On neurontin.  Also on sinamet.

## 2015-02-07 NOTE — Telephone Encounter (Signed)
Pt followed by Dr Grayland Ormond.  Needs last two mammograms.  Thanks.

## 2015-02-09 NOTE — Telephone Encounter (Signed)
Per [facility] patient hasn't had a mammogram since 2013. She was scheduled for one in 2015 (cancelled)

## 2015-02-09 NOTE — Telephone Encounter (Signed)
Please notify pt and see if she is still getting mammograms.  She has been followed by Dr Grayland Ormond for her breast cancer.  Thanks

## 2015-02-11 NOTE — Telephone Encounter (Signed)
Pt states that they would not do a mammogram on her in 2015 because she has a saline implant in one breast and a removal of the other. She said that she was undressed and everything & they came in & told her that they have changed the protocol.

## 2015-02-12 NOTE — Telephone Encounter (Signed)
Noted.  Need Dr Gary Fleet last two office notes.  Thanks.

## 2015-02-12 NOTE — Telephone Encounter (Signed)
Records requested

## 2015-02-15 ENCOUNTER — Ambulatory Visit: Payer: No Typology Code available for payment source | Admitting: Internal Medicine

## 2015-02-16 NOTE — Telephone Encounter (Signed)
Records received & placed in green folder

## 2015-02-16 NOTE — Telephone Encounter (Signed)
Will review 

## 2015-02-17 ENCOUNTER — Other Ambulatory Visit (INDEPENDENT_AMBULATORY_CARE_PROVIDER_SITE_OTHER): Payer: Medicare Other

## 2015-02-17 DIAGNOSIS — E7800 Pure hypercholesterolemia, unspecified: Secondary | ICD-10-CM

## 2015-02-17 DIAGNOSIS — I10 Essential (primary) hypertension: Secondary | ICD-10-CM | POA: Diagnosis not present

## 2015-02-17 DIAGNOSIS — D649 Anemia, unspecified: Secondary | ICD-10-CM

## 2015-02-17 LAB — HEPATIC FUNCTION PANEL
ALBUMIN: 4.3 g/dL (ref 3.5–5.2)
ALT: 24 U/L (ref 0–35)
AST: 27 U/L (ref 0–37)
Alkaline Phosphatase: 60 U/L (ref 39–117)
Bilirubin, Direct: 0 mg/dL (ref 0.0–0.3)
Total Bilirubin: 0.3 mg/dL (ref 0.2–1.2)
Total Protein: 7.3 g/dL (ref 6.0–8.3)

## 2015-02-17 LAB — CBC WITH DIFFERENTIAL/PLATELET
BASOS ABS: 0 10*3/uL (ref 0.0–0.1)
BASOS PCT: 1 % (ref 0.0–3.0)
EOS ABS: 0.3 10*3/uL (ref 0.0–0.7)
Eosinophils Relative: 7.8 % — ABNORMAL HIGH (ref 0.0–5.0)
HEMATOCRIT: 35.8 % — AB (ref 36.0–46.0)
HEMOGLOBIN: 12.3 g/dL (ref 12.0–15.0)
LYMPHS ABS: 1.2 10*3/uL (ref 0.7–4.0)
Lymphocytes Relative: 27.2 % (ref 12.0–46.0)
MCHC: 34.3 g/dL (ref 30.0–36.0)
MCV: 88.2 fl (ref 78.0–100.0)
MONOS PCT: 8 % (ref 3.0–12.0)
Monocytes Absolute: 0.3 10*3/uL (ref 0.1–1.0)
NEUTROS ABS: 2.4 10*3/uL (ref 1.4–7.7)
Neutrophils Relative %: 56 % (ref 43.0–77.0)
Platelets: 209 10*3/uL (ref 150.0–400.0)
RBC: 4.06 Mil/uL (ref 3.87–5.11)
RDW: 13.1 % (ref 11.5–15.5)
WBC: 4.2 10*3/uL (ref 4.0–10.5)

## 2015-02-17 LAB — BASIC METABOLIC PANEL
BUN: 16 mg/dL (ref 6–23)
CO2: 31 mEq/L (ref 19–32)
Calcium: 10.1 mg/dL (ref 8.4–10.5)
Chloride: 99 mEq/L (ref 96–112)
Creatinine, Ser: 0.89 mg/dL (ref 0.40–1.20)
GFR: 65.31 mL/min (ref >60.00)
GLUCOSE: 91 mg/dL (ref 70–99)
Potassium: 4.4 mEq/L (ref 3.5–5.1)
Sodium: 135 mEq/L (ref 135–145)

## 2015-02-17 LAB — LDL CHOLESTEROL, DIRECT: Direct LDL: 90 mg/dL

## 2015-02-17 LAB — LIPID PANEL
CHOL/HDL RATIO: 5
CHOLESTEROL: 181 mg/dL (ref 0–200)
HDL: 38.1 mg/dL — ABNORMAL LOW (ref >39.00)
NonHDL: 142.9
Triglycerides: 281 mg/dL — ABNORMAL HIGH (ref 0.0–149.0)
VLDL: 56.2 mg/dL — AB (ref 0.0–40.0)

## 2015-02-18 ENCOUNTER — Encounter: Payer: Self-pay | Admitting: *Deleted

## 2015-02-22 DIAGNOSIS — G8929 Other chronic pain: Secondary | ICD-10-CM | POA: Diagnosis not present

## 2015-02-22 DIAGNOSIS — Z79899 Other long term (current) drug therapy: Secondary | ICD-10-CM | POA: Diagnosis not present

## 2015-02-23 DIAGNOSIS — Z961 Presence of intraocular lens: Secondary | ICD-10-CM | POA: Diagnosis not present

## 2015-03-08 DIAGNOSIS — G8929 Other chronic pain: Secondary | ICD-10-CM | POA: Diagnosis not present

## 2015-03-08 DIAGNOSIS — M25511 Pain in right shoulder: Secondary | ICD-10-CM | POA: Diagnosis not present

## 2015-03-08 DIAGNOSIS — M545 Low back pain: Secondary | ICD-10-CM | POA: Diagnosis not present

## 2015-03-08 DIAGNOSIS — M19019 Primary osteoarthritis, unspecified shoulder: Secondary | ICD-10-CM | POA: Diagnosis not present

## 2015-03-09 ENCOUNTER — Ambulatory Visit
Admit: 2015-03-09 | Disposition: A | Discharge status: Home / Self Care | Payer: Self-pay | Attending: Oncology | Admitting: Oncology

## 2015-03-09 DIAGNOSIS — R5383 Other fatigue: Secondary | ICD-10-CM | POA: Diagnosis not present

## 2015-03-09 DIAGNOSIS — Z9011 Acquired absence of right breast and nipple: Secondary | ICD-10-CM | POA: Diagnosis not present

## 2015-03-09 DIAGNOSIS — Z9049 Acquired absence of other specified parts of digestive tract: Secondary | ICD-10-CM | POA: Diagnosis not present

## 2015-03-09 DIAGNOSIS — E785 Hyperlipidemia, unspecified: Secondary | ICD-10-CM | POA: Diagnosis not present

## 2015-03-09 DIAGNOSIS — Z170 Estrogen receptor positive status [ER+]: Secondary | ICD-10-CM | POA: Diagnosis not present

## 2015-03-09 DIAGNOSIS — R531 Weakness: Secondary | ICD-10-CM | POA: Diagnosis not present

## 2015-03-09 DIAGNOSIS — Z79811 Long term (current) use of aromatase inhibitors: Secondary | ICD-10-CM | POA: Diagnosis not present

## 2015-03-09 DIAGNOSIS — Z79899 Other long term (current) drug therapy: Secondary | ICD-10-CM | POA: Diagnosis not present

## 2015-03-09 DIAGNOSIS — G20 Parkinson's disease: Secondary | ICD-10-CM | POA: Diagnosis not present

## 2015-03-09 DIAGNOSIS — Z7982 Long term (current) use of aspirin: Secondary | ICD-10-CM | POA: Diagnosis not present

## 2015-03-09 DIAGNOSIS — I341 Nonrheumatic mitral (valve) prolapse: Secondary | ICD-10-CM | POA: Diagnosis not present

## 2015-03-09 DIAGNOSIS — I10 Essential (primary) hypertension: Secondary | ICD-10-CM | POA: Diagnosis not present

## 2015-03-09 DIAGNOSIS — Z90710 Acquired absence of both cervix and uterus: Secondary | ICD-10-CM | POA: Diagnosis not present

## 2015-03-09 DIAGNOSIS — C50911 Malignant neoplasm of unspecified site of right female breast: Secondary | ICD-10-CM | POA: Diagnosis not present

## 2015-03-19 ENCOUNTER — Ambulatory Visit
Admit: 2015-03-19 | Disposition: A | Discharge status: Home / Self Care | Payer: Self-pay | Attending: Oncology | Admitting: Oncology

## 2015-04-02 ENCOUNTER — Other Ambulatory Visit: Payer: Self-pay | Admitting: Internal Medicine

## 2015-04-06 NOTE — Discharge Summary (Signed)
PATIENT NAME:  Wanda Martinez, Wanda Martinez MR#:  [id number] DATE OF BIRTH:  January 26, 1935  DATE OF ADMISSION:  08/28/2012 DATE OF DISCHARGE:  08/29/2012  BRIEF HISTORY: The patient was admitted for right-sided breast cancer for right mastectomy and sentinel node biopsy. Appropriate preoperative preparation and informed consent, the patient was taken to surgery on the morning of 08/28/2012. The procedure was performed without difficulty. A large sentinel node complex was identified. Touch preps were unremarkable and mastectomy was carried out without difficulty. She had some mild nausea and pain control issues following surgery and was admitted over the course of the evening for further observation. Drainage has remained moderate from her JP drains. There is no evidence of any significant fever post surgery. She has been able to tolerate a regular diet. She is discharged home today to be followed in the office in 3 to 5 days' time for drain removal.   DISCHARGE MEDICATIONS:  1. Hydrocodone 325/5 mg p.o. every 6 hours p.r.n. 2. Fluoxetine 10 mg p.o. daily. 3. Evista 60 mg p.o. daily.  4. Hydrochlorothiazide 25 mg p.o. daily.  5. Chondroitin glucosamine one twice a day. 6. Losartan 50 mg p.o. daily. 7. Meloxicam 7.5 mg p.o. daily. 8. Metoprolol 25 mg by mouth daily. 9. Trazodone 50 mg 1/2 tablet once a day. 10. Aspirin 81 mg p.o. daily.  11. Omeprazole 20 mg p.o. daily.            FINAL DISCHARGE DIAGNOSIS: Right breast carcinoma.  SURGERY: Right mastectomy with sentinel lymph node biopsy.  ____________________________ Micheline Maze, MD rle:slb D: 08/29/2012 15:31:22 ET T: 08/30/2012 10:37:22 ET JOB#: [id number]  cc: Micheline Maze, MD, <Dictator> Einar Pheasant, MD [place] MD ELECTRONICALLY SIGNED 09/11/2012 18:25

## 2015-04-06 NOTE — Op Note (Signed)
PATIENT NAME:  Wanda Martinez, KEARN MR#:  [id number] DATE OF BIRTH:  [birth year]  DATE OF PROCEDURE:  09/18/2012  PREOPERATIVE DIAGNOSIS: Nonhealing surgical wound secondary to upper right breast skin flap necrosis.  POSTOPERATIVE DIAGNOSIS: Nonhealing surgical wound secondary to upper right breast skin flap necrosis.  PROCEDURE PERFORMED: Excision of upper skin flap necrosis and secondary wound closure over drain.   SURGEON: Consuela Mimes, M.D.   ANESTHESIA: General.   PROCEDURE IN DETAIL: The patient was placed supine on the operating room table and prepped and draped in the usual sterile fashion. The necrotic area of skin with an additional 5 mm or so was excised sharply to ascertain adequate bleeding and this was carried down through the subcutaneous tissue of the skin flap and the seroma was evacuated. All of the necrotic tissue was excised and a thin sliver of skin was excised from the lower skin flap as well. Although that was viable, I felt the need to obtain fresh tissue there. I then cauterized areas of the pectoralis major muscle and subcutaneous skin flaps as they had generated a rind where the seroma had collected and placed a large Jackson-Pratt drain medially that went down the length of the wound and closed the subdermal layer with interrupted 3-0 Monocryl and reapproximated the skin with a skin stapling device. The Jackson-Pratt drain was secured with a 2-0 nylon suture and a bulky compressive soft dressing was applied along with a compressive athletic bra completing the procedure. The patient tolerated the procedure well. There were no complications.  ____________________________ Consuela Mimes, MD wfm:slb D: 09/18/2012 12:19:27 ET     T: 09/18/2012 12:34:52 ET        JOB#: [id number] cc: Consuela Mimes, MD, <Dictator> Consuela Mimes MD ELECTRONICALLY SIGNED 09/18/2012 18:58

## 2015-04-06 NOTE — Op Note (Signed)
PATIENT NAME:  Wanda Martinez, Wanda Martinez MR#:  [id number] DATE OF BIRTH:  [birth year]  DATE OF PROCEDURE:  08/28/2012  PREOPERATIVE DIAGNOSIS: Right breast carcinoma.   POSTOPERATIVE DIAGNOSIS: Right breast carcinoma.  OPERATION: Right mastectomy with sentinel lymph node biopsy.   SURGEON: Rodena Goldmann, III, MD   ANESTHESIA: General.   OPERATIVE PROCEDURE: With the patient in the supine position after induction of appropriate general anesthesia, the patient's breast was prepped with ChloraPrep and draped with sterile towels. A fishmouth incision was made around the nipple and carried down through the subcutaneous tissue with Bovie electrocautery. The anterior flap was identified down to the second intercostal space all the way to the axilla. An inferior flap was created using Bovie electrocautery down to the inframammary fold and to the latissimus muscle laterally. The axilla was interrogated with the Neoprobe and a cluster of lymph nodes identified which had 40 times the background counts. No other significant area was identified with any radionuclide material. The specimen was sent to pathology where touch  preps did not reveal any macrometastasis.  The area was copiously irrigated. The breast was then clipped off the chest wall using Bovie electrocautery and 3-0 silk on the large perforating blood vessels. The specimen was marked and sent to pathology. The area was irrigated and observed. No significant bleeding was encountered. Two flat Jackson-Pratt drains were inserted, one through the anterior flap and one to the axilla and secured with 3-0 nylon. The breast was then closed using 3-0 nylon in vertical mattress fashion. Sterile compressive dressing was applied. The patient was returned to the recovery room having tolerated the procedure well. Sponge, instrument, and needle counts were correct x2 in the operating room.   ____________________________ Rodena Goldmann III, MD rle:cbb D: 08/28/2012 12:30:32  ET T: 08/28/2012 12:37:47 ET JOB#: [id number] cc: Einar Pheasant, MD Rodena Goldmann MD ELECTRONICALLY SIGNED 09/11/2012 18:25

## 2015-04-09 NOTE — Consult Note (Signed)
Reason for Visit: This 80 year old Female patient presents to the clinic for initial evaluation of  breast cancer .   Referred by Dr. Grayland Ormond.  Diagnosis:   Chief Complaint/Diagnosis   80 year old female status post right modified radical mastectomy and sentinel node biopsy for a stage IIa(T1 C. N1 M0) ER positive PR borderline positive HER-2/neu negative invasive mammary carcinoma status post adjuvant chemotherapy with Cytoxan and Taxotere   Pathology Report pathology reports reviewed    Imaging Report mammograms ultrasound reviewed    Referral Report clinical notes reviewed    Planned Treatment Regimen adjuvant right chest wall and peripheral lymphatic radiation    HPI   patient is a 80 year old female who presented with an abnormal mammogram of the right breast showing a nodular asymmetry in the right breast medially along the posterior nipple line. She underwent biopsy by Dr. Annice Needy showing invasive mammary carcinoma. Went on to have a right modified radical mastectomy and sentinel node biopsy for a 1.1 cm invasive mammary carcinoma. DCIS was present. Margins were clear. One sentinel node lymph node showing micrometastatic disease at 8 mm. Tumor was ERpositive, PR borderline positive HER-2/neu negative. She did have problems with wound healing and have a revision of her surgical scar which eventually healed well. She was seen by Dr. Grayland Ormond has undergone 4 cycles of Cytoxan plus Taxotere. She has tolerated her treatments fairly well although does feel sort of weak and fatigued which we would expect at this point. She is now referred to radiation oncology for consideration of treatment.  Past Hx:    Back pain- Chronic:    Right Breast cancer: Jul 2013   Hyperlipidemia:    HTN:    Mitral Valve Prolapse:    Heart Murmer:    rigth masectomy 08/28/12:    Tonsillectomy:    Left breast implant: 1986   Mastectomy left breast: 1986   Left breast cancer: 1986   hysterectomy:    Past, Family and Social History:   Past Medical History positive    Cardiovascular hyperlipidemia; hypertension; heart murmur, mitral Valprolapse    Past Surgical History status post left mastectomy 30 years prior with reconstruction, hysterectomy, tonsillectomy    Past Medical History Comments chronic back pain    Family History noncontributory    Social History noncontributory    Additional Past Medical and Surgical History accompanied by her husband today   Allergies:   No Known Allergies:   Home Meds:  Home Medications: Medication Instructions Status  acetaminophen-hydrocodone 325 mg-5 mg oral tablet 1 tab(s) orally 3 times a day, As Needed- for Pain  Active  Compazine tablet 10 mg 1 tab(s) orally every 6 hours x 30 days as needed   Active  calcium (as citrate)-vitamin D 200 mg-200 intl units oral tablet 1 tab oral BID Active  fluoxetine 10 mg oral capsule 1 ea orally once a day  in am Active  Fish Oil 1200 mg oral capsule 1 cap(s) orally 2 times a day Active  chondroitin-glucosamine 1 cap(s) orally 2 times a day Active  meloxicam 7.5 mg oral tablet 1 tab(s) orally once a day (in the morning) Active  metoprolol tartrate 25 mg oral tablet 1 tab(s) orally 2 times a day Active  trazodone 50 mg oral tablet 0.5 tab(s) orally once a day (at bedtime) Active  aspirin 81 mg oral tablet 1 tab(s) orally once a day in am Active  one a day vitamine 1 tab(s) orally once a day in am Active  omeprazole  20 mg oral delayed release capsule 1 cap(s) orally 2 times a day Active  losartan 100 mg oral tablet 1 tab(s) orally once a day Active   Review of Systems:   General negative    Performance Status (ECOG) 0    Skin negative    Breast see HPI    Ophthalmologic negative    ENMT negative    Respiratory and Thorax negative    Cardiovascular negative    Gastrointestinal negative    Genitourinary negative    Musculoskeletal negative    Neurological negative    Psychiatric  negative    Hematology/Lymphatics negative    Endocrine negative    Allergic/Immunologic negative    Review of Systems   aside from her fatigue from chemotherapy and chronic back pain according to nurse's notesPatient denies any weight loss, fatigue, weakness, fever, chills or night sweats. Patient denies any loss of vision, blurred vision. Patient denies any ringing  of the ears or hearing loss. No irregular heartbeat. Patient denies heart murmur or history of fainting. Patient denies any chest pain or pain radiating to her upper extremities. Patient denies any shortness of breath, difficulty breathing at night, cough or hemoptysis. Patient denies any swelling in the lower legs. Patient denies any nausea vomiting, vomiting of blood, or coffee ground material in the vomitus. Patient denies any stomach pain. Patient states has had normal bowel movements no significant constipation or diarrhea. Patient denies any dysuria, hematuria or significant nocturia. Patient denies any problems walking, swelling in the joints or loss of balance. Patient denies any skin changes, loss of hair or loss of weight. Patient denies any excessive worrying or anxiety or significant depression. Patient denies any problems with insomnia. Patient denies excessive thirst, polyuria, polydipsia. Patient denies any swollen glands, patient denies easy bruising or easy bleeding. Patient denies any recent infections, allergies or URI. Patient "s visual fields have not changed significantly in recent time.  Nursing Notes:  Nursing Vital Signs and Chemo Nursing Nursing Notes: *CC Vital Signs Flowsheet:   24-Jan-14 10:10   Temp Temperature 97.8   Pulse Pulse 88   Respirations Respirations 20   SBP SBP 130   DBP DBP 73   Pain Scale (0-10)  1   Current Weight (kg) (kg) 83.6   Height (cm) centimeters 160   BSA (m2) 1.8   Physical Exam:  General/Skin/HEENT:   General normal    Skin normal    Eyes normal    ENMT normal     Head and Neck normal    Additional PE well-developed female in NAD. Has alopecia of the scalp. She status post left mastectomy with reconstruction. She's also status post right modified radical mastectomy. Incision is well-healed. No evidence of chest wall mass or nodularity is identified. Left breast is no evidence of dominant mass or nodularity. No axillary or supraclavicular adenopathy is identified. No evidence of lymphedema in her upper extremities is noted. Lungs are clear to A&P cardiac examination shows regular rate and rhythm.   Breasts/Resp/CV/GI/GU:   Respiratory and Thorax normal    Cardiovascular normal    Gastrointestinal normal    Genitourinary normal   MS/Neuro/Psych/Lymph:   Musculoskeletal normal    Neurological normal    Lymphatics normal   Assessment and Plan:  Impression:   stage IIa invasive mammary carcinoma the right breast status postright modified radical mastectomy and sentinel node biopsy with one sentinel node exam and showing micrometastatic disease. Tumor is ER positive PR borderline. Patient is status  post 4 cycles Cytoxan Taxotere.  Plan:   I have discussed the case personally with Dr. Grayland Ormond.. I have reviewed the [place] [facility]. guidelines for stage IIa disease. Unfortunately she had an incomplete axillary node dissection with only one node being sampled and that positive for micrometastatic disease.[place] [facility]. guidelines strongly recommend is consideration of adjuvant radiation therapy based on the IIa nature of her disease. I believe he's had a complete axillary dissection and radiation therapy to the right chest wall and peripheral lymphatics may improve oh certainly overall local control of tumor and by recent studies improve her survival. I have recommended 5000 cGy to the right chest wall and peripheral lymphatics. Would boost or scar another 1400 cGy using electron beam. Risks and benefits of treatment including skin reaction, fatigue, alteration blood counts,  and possibility although very slight of right upper extremity lymphedema. Patient seems to comprehend my treatment plan well. I will allow her to recover somewhat more from her chemotherapy and have set her up in about 2 weeks for CT simulation.  I would like to take this opportunity to thank you for allowing me to continue to participate in this patient's care.  CC Referral:   cc: Dr. Pat Patrick, Dr. Einar Pheasant   Electronic Signatures: Armstead Peaks (MD)  (Signed 24-Jan-14 10:53)  Authored: HPI, Diagnosis, Past Hx, PFSH, Allergies, Home Meds, ROS, Nursing Notes, Physical Exam, Encounter Assessment and Plan, CC Referring Physician   Last Updated: 24-Jan-14 10:53 by Armstead Peaks (MD)

## 2015-04-09 NOTE — Op Note (Signed)
PATIENT NAME:  Wanda Martinez, Wanda Martinez MR#:  [id number] DATE OF BIRTH:  1935-10-30  DATE OF PROCEDURE:  06/17/2013  PREOPERATIVE DIAGNOSIS: Breast cancer with chest wall masses.   POSTOPERATIVE DIAGNOSIS: Breast cancer with chest wall masses.  OPERATION: Excision of 3 chest wall masses.   ANESTHESIA: General.  SURGEON: Rodena Goldmann, M.D.   ASSISTANTBrigitte Pulse, [employer] student.   DESCRIPTION OF PROCEDURE: With the patient in the supine position after the induction of appropriate general anesthesia, the patient's right chest was prepped with ChloraPrep and draped with sterile towels. The previously marked lesions were individually identified and excised using a skin incision, blunt dissection and electrocautery. Each was examined and they were sent together. The areas were cleaned and closed with 4-0 nylon and 3-0 Vicryl. Steri-Strips were applied. Sterile dressings were also applied. The patient was returned to the recovery room having tolerated the procedure well. Sponge, instrument and needle counts were correct x2 in the operating room.  ____________________________ Micheline Maze, MD rle:aw D: 06/17/2013 08:18:11 ET T: 06/17/2013 08:34:14 ET JOB#: [id number]  cc: Micheline Maze, MD, <Dictator> [place] MD ELECTRONICALLY SIGNED 06/20/2013 8:24

## 2015-04-10 NOTE — Op Note (Signed)
PATIENT NAME:  Wanda Martinez, Wanda Martinez MR#:  [id number] DATE OF BIRTH:  [birth year]  DATE OF PROCEDURE:  02/02/2014  PREOPERATIVE DIAGNOSIS:  Cataract, left eye.    POSTOPERATIVE DIAGNOSIS:  Cataract, left eye.  PROCEDURE PERFORMED:  Extracapsular cataract extraction using phacoemulsification with placement of an Alcon [id number], 24.5-diopter posterior chamber lens, serial [id number].  SURGEON:  Loura Back. Genae Strine, MD  ASSISTANT:  None.  ANESTHESIA:  4% lidocaine and 0.75% Marcaine in a 50/50 mixture with 10 units/mL of Hylenex added, given as a peribulbar.   ANESTHESIOLOGIST:  Dr. Kayleen Memos.  COMPLICATIONS:  None.  ESTIMATED BLOOD LOSS:  Less than 1 ml.  DESCRIPTION OF PROCEDURE:  The patient was brought to the operating room and given a peribulbar block.  The patient was then prepped and draped in the usual fashion.  The vertical rectus muscles were imbricated using 5-0 silk sutures.  These sutures were then clamped to the sterile drapes as bridle sutures.  A limbal peritomy was performed extending two clock hours and hemostasis was obtained with cautery.  A partial thickness scleral groove was made at the surgical limbus and dissected anteriorly in a lamellar dissection using an Alcon crescent knife.  The anterior chamber was entered supero-temporally with a Superblade and through the lamellar dissection with a 2.6 mm keratome.  DisCoVisc was used to replace the aqueous and a continuous tear capsulorrhexis was carried out.  Hydrodissection and hydrodelineation were carried out with balanced salt and a 27 gauge canula.  The nucleus was rotated to confirm the effectiveness of the hydrodissection.  Phacoemulsification was carried out using a divide-and-conquer technique.  Total ultrasound time was 1 minute and 18 seconds with an average power of 25.1 percent and CDE of 28.72.  Irrigation/aspiration was used to remove the residual cortex.  DisCoVisc was used to inflate the capsule and the internal  incision was enlarged to 3 mm with the crescent knife.  The intraocular lens was folded and inserted into the capsular bag using the AcrySert delivery system.  Irrigation/aspiration was used to remove the residual DisCoVisc.  Miostat was injected into the anterior chamber through the paracentesis track to inflate the anterior chamber and induce miosis. A tenth of a milliliter of cefuroxime was injected via the paracentesis tract containing 1 mg of drug. The wound was checked for leaks and none were found. The conjunctiva was closed with cautery and the bridle sutures were removed.  Two drops of 0.3% Vigamox were placed on the eye.   An eye shield was placed on the eye.  The patient was discharged to the recovery room in good condition.  ____________________________ Loura Back Kimberlee Shoun, MD sad:sb D: 02/02/2014 11:49:46 ET T: 02/02/2014 13:23:41 ET JOB#: [id number]  cc: Remo Lipps A. Aamani Moose, MD, <Dictator> Martie Lee MD ELECTRONICALLY SIGNED 02/23/2014 13:46

## 2015-04-10 NOTE — Op Note (Signed)
PATIENT NAME:  Wanda Martinez, Wanda Martinez MR#:  [id number] DATE OF BIRTH:  [birth year]  DATE OF PROCEDURE:  12/29/2013   PREOPERATIVE DIAGNOSIS:  Cataract, right eye.   POSTOPERATIVE DIAGNOSIS:  Cataract, right eye.  PROCEDURE PERFORMED:  Extracapsular cataract extraction using phacoemulsification with placement of an Alcon SN60WF, 23.5-diopter posterior chamber lens, serial # [id number].  SURGEON:  Loura Back. Terrisha Lopata, MD  ASSISTANT:  None.  ANESTHESIA:  4% lidocaine and 0.75% Marcaine in a 50/50 mixture with 10 units/mL of Hylenex added, given as a peribulbar.  ANESTHESIOLOGIST:  Velora Heckler. Polin, MD  COMPLICATIONS:  None.  ESTIMATED BLOOD LOSS:  Less than 1 ml.  DESCRIPTION OF PROCEDURE:  The patient was brought to the operating room and given a peribulbar block.  The patient was then prepped and draped in the usual fashion.  The vertical rectus muscles were imbricated using 5-0 silk sutures.  These sutures were then clamped to the sterile drapes as bridle sutures.  A limbal peritomy was performed extending two clock hours and hemostasis was obtained with cautery.  A partial thickness scleral groove was made at the surgical limbus and dissected anteriorly in a lamellar dissection using an Alcon crescent knife.  The anterior chamber was entered superonasally with a Superblade and through the lamellar dissection with a 2.6 mm keratome.  DisCoVisc was used to replace the aqueous and a continuous tear capsulorrhexis was carried out.  Hydrodissection and hydrodelineation were carried out with balanced salt and a 27 gauge canula.  The nucleus was rotated to confirm the effectiveness of the hydrodissection.  Phacoemulsification was carried out using a divide-and-conquer technique.  Total ultrasound time was 1 minute and 16 seconds with an average power of 22.3 percent.  CDE of 31.11.  Irrigation/aspiration was used to remove the residual cortex.  DisCoVisc was used to inflate the capsule and the internal  incision was enlarged to 3 mm with the crescent knife.  The intraocular lens was folded and inserted into the capsular bag using an AcrySert delivery system instead of the [employer].  Irrigation/aspiration was used to remove the residual DisCoVisc.  Miostat was injected into the anterior chamber through the paracentesis track to inflate the anterior chamber and induce miosis.  Cefuroxime 0.1 mL was injected via the paracentesis track.  The wound was checked for leaks and none were found. The conjunctiva was closed with cautery and the bridle sutures were removed.  Two drops of 0.3% Vigamox were placed on the eye.   An eye shield was placed on the eye.  The patient was discharged to the recovery room in good condition.    ____________________________ Loura Back Devlyn Parish, MD sad:cs D: 12/29/2013 13:28:28 ET T: 12/29/2013 20:57:01 ET JOB#: [id number]  cc: Remo Lipps A. Jaculin Rasmus, MD, <Dictator> Martie Lee MD ELECTRONICALLY SIGNED 01/05/2014 13:27

## 2015-04-22 ENCOUNTER — Other Ambulatory Visit: Payer: Self-pay | Admitting: Oncology

## 2015-04-22 DIAGNOSIS — C50919 Malignant neoplasm of unspecified site of unspecified female breast: Secondary | ICD-10-CM

## 2015-05-05 ENCOUNTER — Encounter (INDEPENDENT_AMBULATORY_CARE_PROVIDER_SITE_OTHER): Payer: Self-pay

## 2015-05-05 ENCOUNTER — Ambulatory Visit
Admission: RE | Admit: 2015-05-05 | Discharge: 2015-05-05 | Disposition: A | Discharge status: Home / Self Care | Payer: Medicare Other | Source: Ambulatory Visit | Attending: Radiation Oncology | Admitting: Radiation Oncology

## 2015-05-05 ENCOUNTER — Other Ambulatory Visit: Payer: Self-pay | Admitting: *Deleted

## 2015-05-05 ENCOUNTER — Encounter: Payer: Self-pay | Admitting: Radiation Oncology

## 2015-05-05 VITALS — BP 142/80 | HR 60 | Temp 97.8°F | Resp 18 | Wt 183.6 lb

## 2015-05-05 DIAGNOSIS — C50911 Malignant neoplasm of unspecified site of right female breast: Secondary | ICD-10-CM

## 2015-05-05 NOTE — Progress Notes (Signed)
Radiation Oncology Follow up Note  Name: Wanda Martinez   Date:   05/05/2015 MRN:  [id number] DOB: 1935-03-01    This 80 y.o. female presents to the clinic today for patient is a 80 year old female who presented with an abnormal mammogram of the right breast showing a nodular asymmetry in the right breast medially along the posterior nipple line. She underwent biopsy by Dr. Annice Needy showing invasive mammary carcinoma. Went on to have a right modified radical mastectomy and sentinel node biopsy for a 1.1 cm invasive mammary carcinoma. DCIS was present. Margins were clear. One sentinel node lymph node showing micrometastatic disease at 8 mm. Tumor was ERpositive, PR borderline positive HER-2/neu negative. She did have problems with wound healing and have a revision of her surgical scar which eventually healed well. She was seen by Dr. Grayland Ormond has undergone 4 cycles of Cytoxan plus Taxotere. She is now 2 years out from radiation therapy to her right chest wall peripheral lymphatic she's also had breast modification surgery to her left breast. According to radiology she is not appropriate for follow-up mammograms. She seen today in routine follow-up and is doing well. She does have a ridge of skin thickening in the inferior portion of her right mastectomy scar. She's currently on letrozole and tolerating that well. She does have Parkinson's disease.  REFERRING PROVIDER: Einar Pheasant, MD  HPI: As above.  COMPLICATIONS OF TREATMENT: none  FOLLOW UP COMPLIANCE: keeps appointments   PHYSICAL EXAM:  BP 142/80 mmHg  Pulse 60  Temp(Src) 97.8 F (36.6 C)  Resp 18  Wt 183 lb 10.3 oz (83.3 kg)  LMP 12/18/1981 Well-developed female in NAD D. She status post right modified radical mastectomy. There is a ridge of smooth nontender tissue in the inferior portion of her right mastectomy scar believe it was on her last examination although could be some interval growth. She has had left breast  reconstruction no dominant mass or nodularity is noted in that breast. No axillary or supraclavicular adenopathy is identified. Well-developed well-nourished patient in NAD. HEENT reveals PERLA, EOMI, discs not visualized.  Oral cavity is clear. No oral mucosal lesions are identified. Neck is clear without evidence of cervical or supraclavicular adenopathy. Lungs are clear to A&P. Cardiac examination is essentially unremarkable with regular rate and rhythm without murmur rub or thrill. Abdomen is benign with no organomegaly or masses noted. Motor sensory and DTR levels are equal and symmetric in the upper and lower extremities. Cranial nerves II through XII are grossly intact. Proprioception is intact. No peripheral adenopathy or edema is identified. No motor or sensory levels are noted. Crude visual fields are within normal range.  RADIOLOGY RESULTS: I have ordered a CT scan of the chest with contrast  PLAN: At this time I'm concerned about the ridge of tissue in the inferior portion of mastectomy scar on the right. Since mammograms are not permitted will go ahead and order a CT scan with contrast of her chest to evaluate that area. If that is normal see her back in 1 year for follow-up. She continues on letrozole without side effect.  I would like to take this opportunity for allowing me to participate in the care of your patient.Armstead Peaks., MD

## 2015-05-05 NOTE — Progress Notes (Signed)
Per patient mammography wouldn't perform her mammograms any longer.

## 2015-05-12 ENCOUNTER — Other Ambulatory Visit: Payer: Self-pay | Admitting: *Deleted

## 2015-05-12 ENCOUNTER — Ambulatory Visit
Admission: RE | Admit: 2015-05-12 | Discharge: 2015-05-12 | Disposition: A | Discharge status: Home / Self Care | Payer: Medicare Other | Source: Ambulatory Visit | Attending: Radiation Oncology | Admitting: Radiation Oncology

## 2015-05-12 DIAGNOSIS — Z9011 Acquired absence of right breast and nipple: Secondary | ICD-10-CM | POA: Diagnosis not present

## 2015-05-12 DIAGNOSIS — C50911 Malignant neoplasm of unspecified site of right female breast: Secondary | ICD-10-CM

## 2015-05-12 DIAGNOSIS — Z853 Personal history of malignant neoplasm of breast: Secondary | ICD-10-CM | POA: Insufficient documentation

## 2015-05-12 DIAGNOSIS — R918 Other nonspecific abnormal finding of lung field: Secondary | ICD-10-CM | POA: Diagnosis not present

## 2015-05-12 DIAGNOSIS — Z9012 Acquired absence of left breast and nipple: Secondary | ICD-10-CM | POA: Insufficient documentation

## 2015-05-12 DIAGNOSIS — R079 Chest pain, unspecified: Secondary | ICD-10-CM | POA: Diagnosis not present

## 2015-05-12 MED ORDER — IOHEXOL 300 MG/ML  SOLN
75.0000 mL | Freq: Once | INTRAMUSCULAR | Status: AC | PRN
  Administered 2015-05-12: 75 mL via INTRAVENOUS

## 2015-06-01 ENCOUNTER — Other Ambulatory Visit: Payer: Self-pay | Admitting: Internal Medicine

## 2015-06-03 ENCOUNTER — Ambulatory Visit (INDEPENDENT_AMBULATORY_CARE_PROVIDER_SITE_OTHER): Payer: Medicare Other | Admitting: Internal Medicine

## 2015-06-03 ENCOUNTER — Encounter: Payer: Self-pay | Admitting: Internal Medicine

## 2015-06-03 VITALS — BP 120/70 | HR 57 | Temp 98.7°F | Resp 99 | Ht 63.5 in | Wt 183.5 lb

## 2015-06-03 DIAGNOSIS — D649 Anemia, unspecified: Secondary | ICD-10-CM

## 2015-06-03 DIAGNOSIS — N390 Urinary tract infection, site not specified: Secondary | ICD-10-CM | POA: Diagnosis not present

## 2015-06-03 DIAGNOSIS — G20 Parkinson's disease: Secondary | ICD-10-CM

## 2015-06-03 DIAGNOSIS — M545 Low back pain: Secondary | ICD-10-CM

## 2015-06-03 DIAGNOSIS — E7800 Pure hypercholesterolemia, unspecified: Secondary | ICD-10-CM

## 2015-06-03 DIAGNOSIS — C50919 Malignant neoplasm of unspecified site of unspecified female breast: Secondary | ICD-10-CM

## 2015-06-03 DIAGNOSIS — Z0000 Encounter for general adult medical examination without abnormal findings: Secondary | ICD-10-CM

## 2015-06-03 DIAGNOSIS — E669 Obesity, unspecified: Secondary | ICD-10-CM

## 2015-06-03 DIAGNOSIS — I10 Essential (primary) hypertension: Secondary | ICD-10-CM | POA: Diagnosis not present

## 2015-06-03 DIAGNOSIS — R251 Tremor, unspecified: Secondary | ICD-10-CM

## 2015-06-03 DIAGNOSIS — M542 Cervicalgia: Secondary | ICD-10-CM

## 2015-06-03 NOTE — Progress Notes (Signed)
Patient ID: Wanda Martinez, female   DOB: [birth year], 80 y.o.   MRN: [id number]   Subjective:    Patient ID: Wanda Martinez, female    DOB: Dec 15, 1935, 80 y.o.   MRN: [id number]  HPI  Patient here for a scheduled follow up.  She states she has been having pain in her right shoulder.  Increased pain - pain into her shoulder blade.  Notices neck being stiff.  Has f/u with pain clinic next week.  Has baclofen.  Has f/u in pain clinic next week.  No headache reported.  Trying to stay active.  No cardiac symptoms with increased activity or exertion.  Breathing stable.  Eating and drinking well.  No nausea or vomiting.  No bowel change reported.  No abdominal pain reported.     Past Medical History  Diagnosis Date  . GERD (gastroesophageal reflux disease)   . Hiatal hernia   . HTN (hypertension)   . Chronic cystitis   . Urinary incontinence     mixed   . Arthritis   . Anemia   . Breast cancer     Masectomy - left - 1986   . Breast cancer     Mastectomy-right -2014    Current Outpatient Prescriptions on File Prior to Visit  Medication Sig Dispense Refill  . amLODipine (NORVASC) 2.5 MG tablet TAKE 1 TABLET BY MOUTH ONCE DAILY. 30 tablet 5  . aspirin 81 MG EC tablet Take 81 mg by mouth daily as needed.      . baclofen (LIORESAL) 10 MG tablet Take by mouth.    . Calcium Carbonate (CALCIUM 600) 1500 MG TABS Take 1 tablet by mouth daily.      . carbidopa-levodopa (SINEMET IR) 25-100 MG per tablet Take 1 tablet by mouth 3 (three) times daily.    . fish oil-omega-3 fatty acids 1000 MG capsule Take 2 g by mouth 2 (two) times daily.    Marland Kitchen gabapentin (NEURONTIN) 300 MG capsule Take 300 mg by mouth 2 (two) times daily.    . Glucosamine-Chondroit-Vit C-Mn (GLUCOSAMINE CHONDROITIN COMPLX) CAPS Take 2 capsules by mouth daily.      . hydrochlorothiazide (HYDRODIURIL) 25 MG tablet TAKE 1 TABLET BY MOUTH ONCE DAILY. 30 tablet 6  . HYDROcodone-acetaminophen (NORCO) 5-325 MG per tablet Take 0.5-1  tablets by mouth daily.      Marland Kitchen imipramine (TOFRANIL) 25 MG tablet Take 25 mg by mouth.    . letrozole (FEMARA) 2.5 MG tablet     . lisinopril (PRINIVIL,ZESTRIL) 40 MG tablet Take 40 mg by mouth.    . losartan (COZAAR) 100 MG tablet TAKE 1 TABLET BY MOUTH ONCE DAILY. 30 tablet 5  . meloxicam (MOBIC) 7.5 MG tablet Take 1 tablet (7.5 mg total) by mouth daily. 30 tablet 2  . metoprolol succinate (TOPROL-XL) 25 MG 24 hr tablet TAKE 1 TABLET BY MOUTH TWICE DAILY 60 tablet 5  . metroNIDAZOLE (METROGEL) 0.75 % vaginal gel Place vaginally.    . Multiple Vitamin (MULTIVITAMIN) tablet Take 1 tablet by mouth daily.    Marland Kitchen omeprazole (PRILOSEC) 20 MG capsule TAKE (1) CAPSULE BY MOUTH TWICE DAILY FOR REFLUX 60 capsule 3  . pravastatin (PRAVACHOL) 10 MG tablet TAKE 1 TABLET BY MOUTH AT BEDTIME FOR CHOLESTEROL 30 tablet 5  . simvastatin (ZOCOR) 10 MG tablet Take 10 mg by mouth.    . traZODone (DESYREL) 50 MG tablet TAKE ONE TABLET BY MOUTH AT BEDTIME. (Patient taking differently: TAKE ONE TABLET BY MOUTH AT BEDTIME.  PRN) 30 tablet 2   No current facility-administered medications on file prior to visit.    Review of Systems  Constitutional: Negative for appetite change and unexpected weight change.  HENT: Negative for congestion and sinus pressure.   Respiratory: Negative for cough, chest tightness and shortness of breath.   Cardiovascular: Negative for chest pain, palpitations and leg swelling.  Gastrointestinal: Negative for nausea, vomiting, abdominal pain and diarrhea.  Genitourinary:       No urinary symptoms reported.    Musculoskeletal:       Reports some increased pain in her right shoulder and into her shoulder blade.  Neck stiff.  On baclofen.  Has f/u with pain clinic next week.    Skin: Negative for color change and rash.  Neurological: Negative for dizziness, light-headedness and headaches.  Psychiatric/Behavioral: Negative for dysphoric mood and agitation.       Objective:     Pulse  recheck 68  Physical Exam  Constitutional: She appears well-developed and well-nourished. No distress.  HENT:  Nose: Nose normal.  Mouth/Throat: Oropharynx is clear and moist.  Neck: Neck supple. No thyromegaly present.  Cardiovascular: Normal rate and regular rhythm.   Pulmonary/Chest: Breath sounds normal. No respiratory distress. She has no wheezes.  Abdominal: Soft. Bowel sounds are normal. There is no tenderness.  Musculoskeletal: She exhibits no edema or tenderness.  Some minimal pain with rotation of her head - left to right.   Lymphadenopathy:    She has no cervical adenopathy.  Skin: No rash noted. No erythema.  Psychiatric: She has a normal mood and affect. Her behavior is normal.    BP 120/70 mmHg  Pulse 57  Temp(Src) 98.7 F (37.1 C) (Oral)  Resp 99  Ht 5' 3.5" (1.613 m)  Wt 183 lb 8 oz (83.235 kg)  BMI 31.99 kg/m2  SpO2 97%  LMP 12/18/1981 Wt Readings from Last 3 Encounters:  06/03/15 183 lb 8 oz (83.235 kg)  05/05/15 183 lb 10.3 oz (83.3 kg)  02/02/15 185 lb 2 oz (83.972 kg)     Lab Results  Component Value Date   WBC 4.2 02/17/2015   HGB 12.3 02/17/2015   HCT 35.8* 02/17/2015   PLT 209.0 02/17/2015   GLUCOSE 91 02/17/2015   CHOL 181 02/17/2015   TRIG 281.0* 02/17/2015   HDL 38.10* 02/17/2015   LDLDIRECT 90.0 02/17/2015   LDLCALC 85 10/15/2014   ALT 24 02/17/2015   AST 27 02/17/2015   NA 135 02/17/2015   K 4.4 02/17/2015   CL 99 02/17/2015   CREATININE 0.89 02/17/2015   BUN 16 02/17/2015   CO2 31 02/17/2015   TSH 1.77 10/12/2014    Ct Chest W Contrast  05/12/2015   CLINICAL DATA:  Chest and shoulder pain for 1 year. Personal history of breast cancer bilaterally. New findings in chest wall.  EXAM: CT CHEST WITH CONTRAST  TECHNIQUE: Multidetector CT imaging of the chest was performed during intravenous contrast administration.  CONTRAST:  47mL OMNIPAQUE IOHEXOL 300 MG/ML  SOLN  COMPARISON:  None.  FINDINGS: Sequelae of prior bilateral  mastectomies are identified. A left-sided breast implant is in place. There is some nonspecific subcutaneous fat stranding/soft tissue thickening in the right chest wall with a focal area of nodularity measuring 2.4 cm with central fat attenuation (series 2, image 26). No enlarged axillary, mediastinal, or hilar lymph nodes are identified. There is no pleural or pericardial effusion.  Scarring/fibrotic changes in the right upper lobe likely reflect prior radiation therapy, with  minimal traction bronchiectasis in the apex. Minimal pleural thickening is noted in the left lung apex. No lung nodules are seen. Major airways are patent.  The visualized portion of the upper abdomen is unremarkable. No acute osseous abnormality or suspicious lytic or blastic osseous lesion is identified. Mild lumbar dextroscoliosis and mild secondary left convex curvature of the lower thoracic spine are noted.  IMPRESSION: 1. No acute abnormality or definite evidence of metastatic disease in the chest. Mild subcutaneous stranding and soft tissue thickening in the right chest wall may be postoperative, however recommend correlation with physical examination and consideration for ultrasound if there is concern for local disease recurrence. 2. Postradiation changes in the right upper lobe.   Electronically Signed   By: Logan Bores   On: 05/12/2015 15:04       Assessment & Plan:   Problem List Items Addressed This Visit    Anemia    Followed by oncology.  Had colonoscopy in 2013.        Relevant Orders   CBC with Differential/Platelet   Back pain    Has a history of back pain.  See previous note.  With some shoulder pain as outlined.  On baclofen.  Tylenol.  Has f/u with pain clinic next week.        Breast cancer    S/p mastectomy, chemotherapy and XRT.  Recurrent breat cancer - right.  Continues f/u with Dr Grayland Ormond.  They are no longer doing mammograms on her.       Essential hypertension - Primary    Blood pressure  has been under good control.  Same medication regimen.  Follow pressures.  Follow metabolic panel.        Relevant Orders   Basic metabolic panel   Frequent UTI    No reported symptoms on today's visit.  Follow.        Health care maintenance    Physical 11/09/14.  Colonoscopy 03/01/12.  Mammograms - no longer performed.        Hypercholesterolemia    Low cholesterol diet and exercise.  On simvastatin.  Follow lipid panel and liver function tests.        Relevant Orders   Lipid panel   Hepatic function panel   Neck pain    Has a history of neck pain.  On meloxicam.  C-spine xray with DDD c3-C7.  She had wanted to hold on physical therapy.  Having shoulder pain as outlined.  Has an appt with pain clinic next week.       Obesity (BMI 30-39.9)    Diet and exercise.        Parkinsons    Seeing Dr Melrose Nakayama.  See his note for details.  On baclofen and neurontin.        Tremor    Seeing Dr Melrose Nakayama.  See his note for details.  Felt to have parkinsons.  On sinemet and neurontin.          I spent 25 minutes with the patient and more than 50% of the time was spent in consultation regarding the above.     Einar Pheasant, MD

## 2015-06-03 NOTE — Progress Notes (Signed)
Pre visit review using our clinic review tool, if applicable. No additional management support is needed unless otherwise documented below in the visit note. 

## 2015-06-07 ENCOUNTER — Encounter: Payer: Self-pay | Admitting: Internal Medicine

## 2015-06-07 ENCOUNTER — Telehealth: Payer: Self-pay | Admitting: Internal Medicine

## 2015-06-07 DIAGNOSIS — G20A1 Parkinson's disease without dyskinesia, without mention of fluctuations: Secondary | ICD-10-CM | POA: Insufficient documentation

## 2015-06-07 DIAGNOSIS — G20 Parkinson's disease: Secondary | ICD-10-CM | POA: Insufficient documentation

## 2015-06-07 MED ORDER — CIPROFLOXACIN HCL 250 MG PO TABS: 250.0000 mg | ORAL_TABLET | Freq: Two times a day (BID) | ORAL | Status: DC

## 2015-06-07 MED ORDER — PREDNISONE 10 MG PO TABS
ORAL_TABLET | ORAL | Status: DC
Start: 2015-06-07 — End: 2015-09-03

## 2015-06-07 NOTE — Assessment & Plan Note (Signed)
Diet and exercise.   

## 2015-06-07 NOTE — Assessment & Plan Note (Signed)
S/p mastectomy, chemotherapy and XRT.  Recurrent breat cancer - right.  Continues f/u with Dr Grayland Ormond.  They are no longer doing mammograms on her.

## 2015-06-07 NOTE — Assessment & Plan Note (Signed)
Low cholesterol diet and exercise.  On simvastatin.  Follow lipid panel and liver function tests.   

## 2015-06-07 NOTE — Assessment & Plan Note (Signed)
Followed by oncology.  Had colonoscopy in 2013.

## 2015-06-07 NOTE — Telephone Encounter (Signed)
Left message for patient to call office.  

## 2015-06-07 NOTE — Assessment & Plan Note (Signed)
No reported symptoms on today's visit.  Follow.

## 2015-06-07 NOTE — Telephone Encounter (Signed)
I will  call in ciprofloxacin 250 mg bid x 3 days for the UTI.  Continue the meclizine every 6 hours for the dizziness.  Adding prednisone taper. May help resolve the vertigo a little faster.  So will call that it as well.

## 2015-06-07 NOTE — Telephone Encounter (Signed)
Please advise in Dr Lars Mage absence.

## 2015-06-07 NOTE — Telephone Encounter (Signed)
Patient Name: Wanda Martinez  DOB: February 27, 1935    Initial Comment Caller States she is wetting on herself, has burning sensation. she is laying down, when she opens her eyes the room spins, hard for her to get up and go to the bathroom. said she is med for the dizziness that started last night. not sure if it is working or not   Nurse Assessment  Nurse: Verlin Fester, RN, Stanton Kidney Date/Time (Eastern Time): 06/07/2015 9:37:35 AM  Confirm and document reason for call. If symptomatic, describe symptoms. ---Patient states she is having burning with urination and wetting herself, she is also feeling like the room is spinning, she is taking Meclazine. She has had the dizziness before but not for a while. She is taking AZO for the burning.  Has the patient traveled out of the country within the last 30 days? ---No  Does the patient require triage? ---Yes  Related visit to physician within the last 2 weeks? ---No  Does the PT have any chronic conditions? (i.e. diabetes, asthma, etc.) ---Yes  List chronic conditions. ---"Parkinson's disease and HTN     Guidelines    Guideline Title Affirmed Question Affirmed Notes  Urination Pain - Female Age > 32 years    Final Disposition User   See Physician within 24 Hours Noe, RN, Stanton Kidney    Comments  After triage patient states she feels like she is unable to come to the doctor due to the room spinning and would like the doctor to call her something in. States she has had this dizziness before and started taking Meclazine for it last night   Patient instructed someone in the office will call her back

## 2015-06-07 NOTE — Assessment & Plan Note (Signed)
Seeing Dr Melrose Nakayama.  See his note for details.  On baclofen and neurontin.

## 2015-06-07 NOTE — Assessment & Plan Note (Signed)
Blood pressure has been under good control.  Same medication regimen.  Follow pressures.   Follow metabolic panel.   

## 2015-06-07 NOTE — Telephone Encounter (Signed)
I see that Dr Derrel Nip has already responded to this message.  Thank her for covering for me.  Tell pt to let us know or be evaluated if symptoms persist or do not improve.   Thanks

## 2015-06-07 NOTE — Assessment & Plan Note (Signed)
Physical 11/09/14.  Colonoscopy 03/01/12.  Mammograms - no longer performed.

## 2015-06-07 NOTE — Assessment & Plan Note (Signed)
Seeing Dr Melrose Nakayama.  See his note for details.  Felt to have parkinsons.  On sinemet and neurontin.

## 2015-06-07 NOTE — Assessment & Plan Note (Signed)
Has a history of back pain.  See previous note.  With some shoulder pain as outlined.  On baclofen.  Tylenol.  Has f/u with pain clinic next week.

## 2015-06-07 NOTE — Telephone Encounter (Signed)
Patient notified of medication for UTI and vertigo voiced understanding.

## 2015-06-07 NOTE — Assessment & Plan Note (Signed)
Has a history of neck pain.  On meloxicam.  C-spine xray with DDD c3-C7.  She had wanted to hold on physical therapy.  Having shoulder pain as outlined.  Has an appt with pain clinic next week.

## 2015-06-08 DIAGNOSIS — Z79891 Long term (current) use of opiate analgesic: Secondary | ICD-10-CM | POA: Diagnosis not present

## 2015-06-15 ENCOUNTER — Ambulatory Visit
Admission: RE | Admit: 2015-06-15 | Discharge: 2015-06-15 | Disposition: A | Discharge status: Home / Self Care | Payer: Medicare Other | Source: Ambulatory Visit | Attending: Oncology | Admitting: Oncology

## 2015-06-15 ENCOUNTER — Other Ambulatory Visit (INDEPENDENT_AMBULATORY_CARE_PROVIDER_SITE_OTHER): Payer: Medicare Other

## 2015-06-15 DIAGNOSIS — N39498 Other specified urinary incontinence: Secondary | ICD-10-CM

## 2015-06-15 DIAGNOSIS — I10 Essential (primary) hypertension: Secondary | ICD-10-CM | POA: Diagnosis not present

## 2015-06-15 DIAGNOSIS — M8580 Other specified disorders of bone density and structure, unspecified site: Secondary | ICD-10-CM | POA: Insufficient documentation

## 2015-06-15 DIAGNOSIS — E7800 Pure hypercholesterolemia, unspecified: Secondary | ICD-10-CM

## 2015-06-15 DIAGNOSIS — Z13820 Encounter for screening for osteoporosis: Secondary | ICD-10-CM | POA: Diagnosis present

## 2015-06-15 DIAGNOSIS — C50919 Malignant neoplasm of unspecified site of unspecified female breast: Secondary | ICD-10-CM | POA: Insufficient documentation

## 2015-06-15 DIAGNOSIS — D649 Anemia, unspecified: Secondary | ICD-10-CM | POA: Diagnosis not present

## 2015-06-15 DIAGNOSIS — M85852 Other specified disorders of bone density and structure, left thigh: Secondary | ICD-10-CM | POA: Diagnosis not present

## 2015-06-15 LAB — BASIC METABOLIC PANEL
BUN: 24 mg/dL — ABNORMAL HIGH (ref 6–23)
CO2: 32 mEq/L (ref 19–32)
Calcium: 9.8 mg/dL (ref 8.4–10.5)
Chloride: 97 mEq/L (ref 96–112)
Creatinine, Ser: 1.07 mg/dL (ref 0.40–1.20)
GFR: 52.76 mL/min — ABNORMAL LOW (ref >60.00)
Glucose, Bld: 83 mg/dL (ref 70–99)
Potassium: 4.8 mEq/L (ref 3.5–5.1)
Sodium: 135 mEq/L (ref 135–145)

## 2015-06-15 LAB — CBC WITH DIFFERENTIAL/PLATELET
BASOS ABS: 0 10*3/uL (ref 0.0–0.1)
Basophils Relative: 0.2 % (ref 0.0–3.0)
Eosinophils Absolute: 0.3 10*3/uL (ref 0.0–0.7)
Eosinophils Relative: 4.3 % (ref 0.0–5.0)
HEMATOCRIT: 39.5 % (ref 36.0–46.0)
HEMOGLOBIN: 13 g/dL (ref 12.0–15.0)
LYMPHS ABS: 1.8 10*3/uL (ref 0.7–4.0)
Lymphocytes Relative: 23.3 % (ref 12.0–46.0)
MCHC: 32.9 g/dL (ref 30.0–36.0)
MCV: 90.1 fl (ref 78.0–100.0)
Monocytes Absolute: 0.5 10*3/uL (ref 0.1–1.0)
Monocytes Relative: 6.8 % (ref 3.0–12.0)
NEUTROS ABS: 5 10*3/uL (ref 1.4–7.7)
Neutrophils Relative %: 65.4 % (ref 43.0–77.0)
Platelets: 274 10*3/uL (ref 150.0–400.0)
RBC: 4.39 Mil/uL (ref 3.87–5.11)
RDW: 13.7 % (ref 11.5–15.5)
WBC: 7.6 10*3/uL (ref 4.0–10.5)

## 2015-06-15 LAB — LIPID PANEL
CHOLESTEROL: 175 mg/dL (ref 0–200)
HDL: 44.1 mg/dL (ref >39.00)
LDL Cholesterol: 94 mg/dL (ref 0–99)
NonHDL: 130.9
TRIGLYCERIDES: 186 mg/dL — AB (ref 0.0–149.0)
Total CHOL/HDL Ratio: 4
VLDL: 37.2 mg/dL (ref 0.0–40.0)

## 2015-06-15 LAB — HEPATIC FUNCTION PANEL
ALT: 18 U/L (ref 0–35)
AST: 22 U/L (ref 0–37)
Albumin: 4.3 g/dL (ref 3.5–5.2)
Alkaline Phosphatase: 58 U/L (ref 39–117)
BILIRUBIN TOTAL: 0.4 mg/dL (ref 0.2–1.2)
Bilirubin, Direct: 0 mg/dL (ref 0.0–0.3)
Total Protein: 7.2 g/dL (ref 6.0–8.3)

## 2015-06-15 LAB — POCT URINALYSIS DIPSTICK
Blood, UA: NEGATIVE
Glucose, UA: NEGATIVE
Ketones, UA: NEGATIVE
NITRITE UA: NEGATIVE
Protein, UA: 30
Spec Grav, UA: 1.015
UROBILINOGEN UA: 0.2
pH, UA: 7

## 2015-06-15 NOTE — Addendum Note (Signed)
Addended by: Karlene Einstein D on: 06/15/2015 10:18 AM   Modules accepted: Orders

## 2015-06-17 ENCOUNTER — Other Ambulatory Visit: Payer: Medicare Other

## 2015-06-17 LAB — URINE CULTURE
COLONY COUNT: NO GROWTH
Organism ID, Bacteria: NO GROWTH

## 2015-07-01 ENCOUNTER — Other Ambulatory Visit: Payer: Self-pay | Admitting: Internal Medicine

## 2015-07-08 DIAGNOSIS — Z79899 Other long term (current) drug therapy: Secondary | ICD-10-CM | POA: Diagnosis not present

## 2015-07-08 DIAGNOSIS — Z5181 Encounter for therapeutic drug level monitoring: Secondary | ICD-10-CM | POA: Diagnosis not present

## 2015-07-08 DIAGNOSIS — M5416 Radiculopathy, lumbar region: Secondary | ICD-10-CM | POA: Diagnosis not present

## 2015-07-26 DIAGNOSIS — M542 Cervicalgia: Secondary | ICD-10-CM | POA: Diagnosis not present

## 2015-07-26 DIAGNOSIS — F328 Other depressive episodes: Secondary | ICD-10-CM | POA: Diagnosis not present

## 2015-07-26 DIAGNOSIS — E669 Obesity, unspecified: Secondary | ICD-10-CM | POA: Diagnosis not present

## 2015-07-26 DIAGNOSIS — M79603 Pain in arm, unspecified: Secondary | ICD-10-CM

## 2015-07-26 DIAGNOSIS — R251 Tremor, unspecified: Secondary | ICD-10-CM | POA: Diagnosis not present

## 2015-07-26 HISTORY — DX: Pain in arm, unspecified: M79.603

## 2015-09-03 ENCOUNTER — Encounter: Payer: Self-pay | Admitting: Internal Medicine

## 2015-09-03 ENCOUNTER — Ambulatory Visit (INDEPENDENT_AMBULATORY_CARE_PROVIDER_SITE_OTHER): Payer: Medicare Other | Admitting: Internal Medicine

## 2015-09-03 VITALS — BP 110/80 | HR 67 | Temp 98.8°F | Ht 63.5 in | Wt 181.4 lb

## 2015-09-03 DIAGNOSIS — E7800 Pure hypercholesterolemia, unspecified: Secondary | ICD-10-CM

## 2015-09-03 DIAGNOSIS — R5383 Other fatigue: Secondary | ICD-10-CM

## 2015-09-03 DIAGNOSIS — D649 Anemia, unspecified: Secondary | ICD-10-CM

## 2015-09-03 DIAGNOSIS — R251 Tremor, unspecified: Secondary | ICD-10-CM

## 2015-09-03 DIAGNOSIS — Z23 Encounter for immunization: Secondary | ICD-10-CM | POA: Diagnosis not present

## 2015-09-03 DIAGNOSIS — C50919 Malignant neoplasm of unspecified site of unspecified female breast: Secondary | ICD-10-CM

## 2015-09-03 DIAGNOSIS — I10 Essential (primary) hypertension: Secondary | ICD-10-CM

## 2015-09-03 DIAGNOSIS — G20A1 Parkinson's disease without dyskinesia, without mention of fluctuations: Secondary | ICD-10-CM

## 2015-09-03 DIAGNOSIS — E669 Obesity, unspecified: Secondary | ICD-10-CM

## 2015-09-03 DIAGNOSIS — G20 Parkinson's disease: Secondary | ICD-10-CM | POA: Diagnosis not present

## 2015-09-03 NOTE — Progress Notes (Signed)
Pre-visit discussion using our clinic review tool. No additional management support is needed unless otherwise documented below in the visit note.  

## 2015-09-03 NOTE — Progress Notes (Signed)
Patient ID: Wanda Martinez, female   DOB: October 16, 1935, 80 y.o.   MRN: [id number]   Subjective:    Patient ID: Wanda Martinez, female    DOB: [birth year], 80 y.o.   MRN: [id number]  HPI  Patient here to follow up on her blood pressure, elevated cholesterol, breast cancer, etc.  She sees Dr Grayland Ormond for her breast cancer.  Due to see him next week.  Had CT scan recently.  No longer getting mammograms.  States was told CT ok.  She also sees Dr Melrose Nakayama for her parkinsons.  Overall feels is stable.  Some drawing of her right arm (intermittently).  Overall stable.  No cardiac symptoms reported.  Breathing stable.  No acid reflux.  She takes medication to help her bowels move.  Overall stable.  Discussed stress and depression.  States at times she will feel down, but this is not a persistent feeling.  Does not feel needs any further intervention at this time.     Past Medical History  Diagnosis Date  . GERD (gastroesophageal reflux disease)   . Hiatal hernia   . HTN (hypertension)   . Chronic cystitis   . Urinary incontinence     mixed   . Arthritis   . Anemia   . Breast cancer     Masectomy - left - 1986   . Breast cancer     Mastectomy-right -2014   Past Surgical History  Procedure Laterality Date  . Breast biopsy  2013  . Tonsillectomy and adenoidectomy  79  . Vesicovaginal fistula closure w/ tah  1983  . Breast surgery  1986    s/p left mastectomy  . Breast enhancement surgery  1987  . Breast implant removal    . Breast implant removal Right 08/29/2012  . Mastectomy  08/2012    right   Family History  Problem Relation Age of Onset  . Diabetes Father   . Stroke Father   . Colon polyps Father   . Stroke Mother   . Parkinson's disease Mother    Social History   Social History  . Marital Status: Married    Spouse Name: N/A  . Number of Children: 3  . Years of Education: N/A   Social History Main Topics  . Smoking status: Never Smoker   . Smokeless tobacco: Never  Used     Comment: tobacco use - no  . Alcohol Use: No  . Drug Use: No  . Sexual Activity: Not Asked   Other Topics Concern  . None   Social History Narrative    Outpatient Encounter Prescriptions as of 09/03/2015  Medication Sig  . amLODipine (NORVASC) 2.5 MG tablet TAKE 1 TABLET BY MOUTH ONCE DAILY.  Marland Kitchen aspirin 81 MG EC tablet Take 81 mg by mouth daily as needed.    . baclofen (LIORESAL) 10 MG tablet Take by mouth.  . Calcium Carbonate (CALCIUM 600) 1500 MG TABS Take 1 tablet by mouth daily.    . carbidopa-levodopa (SINEMET IR) 25-100 MG per tablet Take 1 tablet by mouth 3 (three) times daily.  . citalopram (CELEXA) 20 MG tablet Take 20 mg by mouth daily.  . fish oil-omega-3 fatty acids 1000 MG capsule Take 2 g by mouth 2 (two) times daily.  Marland Kitchen gabapentin (NEURONTIN) 300 MG capsule Take 300 mg by mouth 2 (two) times daily.  . Glucosamine-Chondroit-Vit C-Mn (GLUCOSAMINE CHONDROITIN COMPLX) CAPS Take 2 capsules by mouth daily.    . hydrochlorothiazide (HYDRODIURIL) 25 MG tablet TAKE  1 TABLET BY MOUTH ONCE DAILY.  Marland Kitchen HYDROcodone-acetaminophen (NORCO) 5-325 MG per tablet Take 0.5-1 tablets by mouth daily.    Marland Kitchen imipramine (TOFRANIL) 25 MG tablet Take 25 mg by mouth.  . letrozole (FEMARA) 2.5 MG tablet   . lisinopril (PRINIVIL,ZESTRIL) 40 MG tablet Take 40 mg by mouth.  . losartan (COZAAR) 100 MG tablet TAKE 1 TABLET BY MOUTH ONCE DAILY.  Marland Kitchen MAGNESIUM-OXIDE 400 (241.3 MG) MG tablet TAKE 1 TABLET BY MOUTH ONCE DAILY.  . meloxicam (MOBIC) 7.5 MG tablet Take 1 tablet (7.5 mg total) by mouth daily.  . metoprolol succinate (TOPROL-XL) 25 MG 24 hr tablet TAKE 1 TABLET BY MOUTH TWICE DAILY  . Multiple Vitamin (MULTIVITAMIN) tablet Take 1 tablet by mouth daily.  Marland Kitchen omeprazole (PRILOSEC) 20 MG capsule TAKE (1) CAPSULE BY MOUTH TWICE DAILY FOR REFLUX  . pravastatin (PRAVACHOL) 10 MG tablet TAKE 1 TABLET BY MOUTH AT BEDTIME FOR CHOLESTEROL  . simvastatin (ZOCOR) 10 MG tablet Take 10 mg by mouth.  .  traZODone (DESYREL) 50 MG tablet TAKE ONE TABLET BY MOUTH AT BEDTIME. (Patient taking differently: TAKE ONE TABLET BY MOUTH AT BEDTIME. PRN)  . [DISCONTINUED] ciprofloxacin (CIPRO) 250 MG tablet Take 1 tablet (250 mg total) by mouth 2 (two) times daily.  . [DISCONTINUED] metroNIDAZOLE (METROGEL) 0.75 % vaginal gel Place vaginally.  . [DISCONTINUED] predniSONE (DELTASONE) 10 MG tablet 6 tablets on Day 1 , then reduce by 1 tablet daily until gone   No facility-administered encounter medications on file as of 09/03/2015.    Review of Systems  Constitutional: Negative for appetite change and unexpected weight change.  HENT: Negative for congestion and sinus pressure.   Eyes: Negative for discharge and visual disturbance.  Respiratory: Negative for cough, chest tightness and shortness of breath.   Cardiovascular: Negative for chest pain, palpitations and leg swelling.  Gastrointestinal: Negative for nausea, vomiting, abdominal pain and diarrhea.  Genitourinary:       No urinary symptoms reported.    Musculoskeletal: Negative for myalgias and joint swelling.  Skin: Negative for color change and rash.  Neurological: Negative for dizziness, light-headedness and headaches.  Psychiatric/Behavioral: Negative for dysphoric mood and agitation.       Objective:     Blood pressure rechecked by me:  130/68  Physical Exam  Constitutional: She appears well-developed and well-nourished. No distress.  HENT:  Nose: Nose normal.  Mouth/Throat: Oropharynx is clear and moist.  Eyes: Conjunctivae are normal. Right eye exhibits no discharge. Left eye exhibits no discharge.  Neck: Neck supple. No thyromegaly present.  Cardiovascular: Normal rate and regular rhythm.   Pulmonary/Chest: Breath sounds normal. No respiratory distress. She has no wheezes.  Abdominal: Soft. Bowel sounds are normal. There is no tenderness.  Musculoskeletal: She exhibits no edema or tenderness.  Lymphadenopathy:    She has no  cervical adenopathy.  Skin: No rash noted. No erythema.  Psychiatric: She has a normal mood and affect. Her behavior is normal.    BP 110/80 mmHg  Pulse 67  Temp(Src) 98.8 F (37.1 C) (Oral)  Ht 5' 3.5" (1.613 m)  Wt 181 lb 6 oz (82.271 kg)  BMI 31.62 kg/m2  SpO2 98%  LMP 12/18/1981 Wt Readings from Last 3 Encounters:  09/03/15 181 lb 6 oz (82.271 kg)  06/03/15 183 lb 8 oz (83.235 kg)  05/05/15 183 lb 10.3 oz (83.3 kg)     Lab Results  Component Value Date   WBC 7.6 06/15/2015   HGB 13.0 06/15/2015  HCT 39.5 06/15/2015   PLT 274.0 06/15/2015   GLUCOSE 83 06/15/2015   CHOL 175 06/15/2015   TRIG 186.0* 06/15/2015   HDL 44.10 06/15/2015   LDLDIRECT 90.0 02/17/2015   LDLCALC 94 06/15/2015   ALT 18 06/15/2015   AST 22 06/15/2015   NA 135 06/15/2015   K 4.8 06/15/2015   CL 97 06/15/2015   CREATININE 1.07 06/15/2015   BUN 24* 06/15/2015   CO2 32 06/15/2015   TSH 1.77 10/12/2014       Assessment & Plan:   Problem List Items Addressed This Visit    Anemia    Followed by oncology.  Colonoscopy 2013.        Breast cancer    S/p mastectomy, chemotherapy and XRT.  Recurrent breast cancer - right.  Continues f/u with Dr Grayland Ormond.  No longer getting mammograms.  Had CT.  Keep f/u with Dr Grayland Ormond.       Essential hypertension    Blood pressure under good control.  Continue same medication regimen.  Follow pressures.  Follow metabolic panel.        Relevant Orders   Basic metabolic panel   Fatigue    Will check routine labs including b!2.       Relevant Orders   TSH   Hypercholesterolemia    On pravastatin.  Low cholesterol diet and exercise.  Follow lipid panel and liver function tests.       Relevant Orders   Lipid panel   Hepatic function panel   Obesity (BMI 30-39.9)    Diet and exercise.        Parkinsons    Followed by Dr Melrose Nakayama.  See his note for details.  Feels stable.       Relevant Orders   Vitamin B12   Tremor    Not a significant  issue for her.  Follow.        Other Visit Diagnoses    Encounter for immunization    -  Primary        Einar Pheasant, MD

## 2015-09-04 ENCOUNTER — Encounter: Payer: Self-pay | Admitting: Internal Medicine

## 2015-09-04 DIAGNOSIS — R5383 Other fatigue: Secondary | ICD-10-CM | POA: Insufficient documentation

## 2015-09-04 NOTE — Assessment & Plan Note (Signed)
On pravastatin.  Low cholesterol diet and exercise.  Follow lipid panel and liver function tests.   

## 2015-09-04 NOTE — Assessment & Plan Note (Signed)
Will check routine labs including b!2.

## 2015-09-04 NOTE — Assessment & Plan Note (Signed)
Followed by oncology.  Colonoscopy 2013.

## 2015-09-04 NOTE — Assessment & Plan Note (Signed)
Not a significant issue for her.  Follow.   

## 2015-09-04 NOTE — Assessment & Plan Note (Signed)
S/p mastectomy, chemotherapy and XRT.  Recurrent breast cancer - right.  Continues f/u with Dr Grayland Ormond.  No longer getting mammograms.  Had CT.  Keep f/u with Dr Grayland Ormond.

## 2015-09-04 NOTE — Assessment & Plan Note (Signed)
Followed by Dr Melrose Nakayama.  See his note for details.  Feels stable.

## 2015-09-04 NOTE — Assessment & Plan Note (Signed)
Diet and exercise.   

## 2015-09-04 NOTE — Assessment & Plan Note (Signed)
Blood pressure under good control.  Continue same medication regimen.  Follow pressures.  Follow metabolic panel.   

## 2015-09-07 ENCOUNTER — Ambulatory Visit: Payer: No Typology Code available for payment source | Admitting: Oncology

## 2015-09-08 ENCOUNTER — Ambulatory Visit: Payer: No Typology Code available for payment source | Admitting: Oncology

## 2015-09-09 ENCOUNTER — Inpatient Hospital Stay: Payer: Medicare Other | Attending: Oncology | Admitting: Oncology

## 2015-09-09 VITALS — BP 171/95 | HR 56 | Temp 97.2°F | Resp 16 | Wt 181.4 lb

## 2015-09-09 DIAGNOSIS — K449 Diaphragmatic hernia without obstruction or gangrene: Secondary | ICD-10-CM | POA: Insufficient documentation

## 2015-09-09 DIAGNOSIS — Z170 Estrogen receptor positive status [ER+]: Secondary | ICD-10-CM | POA: Diagnosis not present

## 2015-09-09 DIAGNOSIS — Z7982 Long term (current) use of aspirin: Secondary | ICD-10-CM | POA: Insufficient documentation

## 2015-09-09 DIAGNOSIS — Z79899 Other long term (current) drug therapy: Secondary | ICD-10-CM | POA: Diagnosis not present

## 2015-09-09 DIAGNOSIS — C50911 Malignant neoplasm of unspecified site of right female breast: Secondary | ICD-10-CM

## 2015-09-09 DIAGNOSIS — Z79811 Long term (current) use of aromatase inhibitors: Secondary | ICD-10-CM | POA: Diagnosis not present

## 2015-09-09 DIAGNOSIS — M129 Arthropathy, unspecified: Secondary | ICD-10-CM | POA: Insufficient documentation

## 2015-09-09 DIAGNOSIS — G20 Parkinson's disease: Secondary | ICD-10-CM | POA: Insufficient documentation

## 2015-09-09 DIAGNOSIS — Z9013 Acquired absence of bilateral breasts and nipples: Secondary | ICD-10-CM | POA: Diagnosis not present

## 2015-09-09 DIAGNOSIS — K219 Gastro-esophageal reflux disease without esophagitis: Secondary | ICD-10-CM | POA: Insufficient documentation

## 2015-09-09 DIAGNOSIS — R32 Unspecified urinary incontinence: Secondary | ICD-10-CM | POA: Diagnosis not present

## 2015-09-09 DIAGNOSIS — D649 Anemia, unspecified: Secondary | ICD-10-CM | POA: Insufficient documentation

## 2015-09-09 DIAGNOSIS — C50912 Malignant neoplasm of unspecified site of left female breast: Secondary | ICD-10-CM | POA: Insufficient documentation

## 2015-09-09 DIAGNOSIS — N3020 Other chronic cystitis without hematuria: Secondary | ICD-10-CM | POA: Diagnosis not present

## 2015-09-09 DIAGNOSIS — I10 Essential (primary) hypertension: Secondary | ICD-10-CM | POA: Insufficient documentation

## 2015-09-09 DIAGNOSIS — M8580 Other specified disorders of bone density and structure, unspecified site: Secondary | ICD-10-CM | POA: Insufficient documentation

## 2015-09-22 NOTE — Progress Notes (Signed)
[place]  [phone]) [phone] [phone]) [phone]  ID: Wanda Martinez OB: Dec 28, 1934  MR#: [id number]  XIH#:[id number]  Patient Care Team: Einar Pheasant, MD as PCP - General (Internal Medicine)  CHIEF COMPLAINT:  Chief Complaint  Patient presents with  . Breast Cancer    INTERVAL HISTORY: Patient returns to clinic today for routine 6 month followup.  Her Parkinson's symptoms continue to be controlled with her current medication regimen. She is tolerating letrozole without significant side effects.  She has no other neurologic complaints.  She denies any recent fevers.  She has a good appetite and denies weight loss.  She denies any chest pain or shortness of breath.  She denies any nausea, vomiting, constipation, or diarrhea.  She has no urinary complaints.  Patient offers no further specific complaints today.   REVIEW OF SYSTEMS:   Review of Systems  Constitutional: Negative.   Respiratory: Negative.   Cardiovascular: Negative.   Gastrointestinal: Negative.   Musculoskeletal: Negative.   Neurological: Positive for tremors.    As per HPI. Otherwise, a complete review of systems is negatve.  PAST MEDICAL HISTORY: Past Medical History  Diagnosis Date  . GERD (gastroesophageal reflux disease)   . Hiatal hernia   . HTN (hypertension)   . Chronic cystitis   . Urinary incontinence     mixed   . Arthritis   . Anemia   . Breast cancer     Masectomy - left - 1986   . Breast cancer     Mastectomy-right -2014    PAST SURGICAL HISTORY: Past Surgical History  Procedure Laterality Date  . Breast biopsy  2013  . Tonsillectomy and adenoidectomy  79  . Vesicovaginal fistula closure w/ tah  1983  . Breast surgery  1986    s/p left mastectomy  . Breast enhancement surgery  1987  . Breast implant removal    . Breast implant removal Right 08/29/2012  . Mastectomy  08/2012    right    FAMILY HISTORY Family History  Problem Relation Age of Onset    . Diabetes Father   . Stroke Father   . Colon polyps Father   . Stroke Mother   . Parkinson's disease Mother        ADVANCED DIRECTIVES:    HEALTH MAINTENANCE: Social History  Substance Use Topics  . Smoking status: Never Smoker   . Smokeless tobacco: Never Used     Comment: tobacco use - no  . Alcohol Use: No     Colonoscopy:  PAP:  Bone density:  Lipid panel:  Allergies  Allergen Reactions  . Vesicare [Solifenacin] Other (See Comments)    Constipation    Current Outpatient Prescriptions  Medication Sig Dispense Refill  . amLODipine (NORVASC) 2.5 MG tablet TAKE 1 TABLET BY MOUTH ONCE DAILY. 30 tablet 6  . aspirin 81 MG EC tablet Take 81 mg by mouth daily as needed.      . baclofen (LIORESAL) 10 MG tablet Take by mouth.    . Calcium Carbonate (CALCIUM 600) 1500 MG TABS Take 1 tablet by mouth daily.      . carbidopa-levodopa (SINEMET IR) 25-100 MG per tablet Take 1 tablet by mouth 3 (three) times daily.    . citalopram (CELEXA) 20 MG tablet Take 20 mg by mouth daily.    . fish oil-omega-3 fatty acids 1000 MG capsule Take 2 g by mouth 2 (two) times daily.    Marland Kitchen gabapentin (NEURONTIN) 300 MG capsule Take 300 mg  by mouth 2 (two) times daily.    . Glucosamine-Chondroit-Vit C-Mn (GLUCOSAMINE CHONDROITIN COMPLX) CAPS Take 2 capsules by mouth daily.      . hydrochlorothiazide (HYDRODIURIL) 25 MG tablet TAKE 1 TABLET BY MOUTH ONCE DAILY. 30 tablet 6  . HYDROcodone-acetaminophen (NORCO) 5-325 MG per tablet Take 0.5-1 tablets by mouth daily.      Marland Kitchen imipramine (TOFRANIL) 25 MG tablet Take 25 mg by mouth.    . letrozole (FEMARA) 2.5 MG tablet     . lisinopril (PRINIVIL,ZESTRIL) 40 MG tablet Take 40 mg by mouth.    . losartan (COZAAR) 100 MG tablet TAKE 1 TABLET BY MOUTH ONCE DAILY. 30 tablet 5  . MAGNESIUM-OXIDE 400 (241.3 MG) MG tablet TAKE 1 TABLET BY MOUTH ONCE DAILY. 30 tablet 6  . meloxicam (MOBIC) 7.5 MG tablet Take 1 tablet (7.5 mg total) by mouth daily. 30 tablet 2  .  metoprolol succinate (TOPROL-XL) 25 MG 24 hr tablet TAKE 1 TABLET BY MOUTH TWICE DAILY 60 tablet 5  . Multiple Vitamin (MULTIVITAMIN) tablet Take 1 tablet by mouth daily.    Marland Kitchen omeprazole (PRILOSEC) 20 MG capsule TAKE (1) CAPSULE BY MOUTH TWICE DAILY FOR REFLUX 60 capsule 6  . pravastatin (PRAVACHOL) 10 MG tablet TAKE 1 TABLET BY MOUTH AT BEDTIME FOR CHOLESTEROL 30 tablet 5  . simvastatin (ZOCOR) 10 MG tablet Take 10 mg by mouth.    . traZODone (DESYREL) 50 MG tablet TAKE ONE TABLET BY MOUTH AT BEDTIME. (Patient taking differently: TAKE ONE TABLET BY MOUTH AT BEDTIME. PRN) 30 tablet 2   No current facility-administered medications for this visit.    OBJECTIVE: Filed Vitals:   09/09/15 1119  BP: 171/95  Pulse: 56  Temp: 97.2 F (36.2 C)  Resp: 16     Body mass index is 31.63 kg/(m^2).    ECOG FS:0 - Asymptomatic  General: Well-developed, well-nourished, no acute distress. Eyes: Pink conjunctiva, anicteric sclera. Breasts: Patient requested exam be deferred today. Lungs: Clear to auscultation bilaterally. Heart: Regular rate and rhythm. No rubs, murmurs, or gallops. Abdomen: Soft, nontender, nondistended. No organomegaly noted, normoactive bowel sounds. Musculoskeletal: No edema, cyanosis, or clubbing. Neuro: Alert, answering all questions appropriately. Cranial nerves grossly intact. Skin: No rashes or petechiae noted. Psych: Normal affect.   LAB RESULTS:  Lab Results  Component Value Date   NA 135 06/15/2015   K 4.8 06/15/2015   CL 97 06/15/2015   CO2 32 06/15/2015   GLUCOSE 83 06/15/2015   BUN 24* 06/15/2015   CREATININE 1.07 06/15/2015   CALCIUM 9.8 06/15/2015   PROT 7.2 06/15/2015   ALBUMIN 4.3 06/15/2015   AST 22 06/15/2015   ALT 18 06/15/2015   ALKPHOS 58 06/15/2015   BILITOT 0.4 06/15/2015   GFRNONAA >60 10/08/2014   GFRAA >60 10/08/2014    Lab Results  Component Value Date   WBC 7.6 06/15/2015   NEUTROABS 5.0 06/15/2015   HGB 13.0 06/15/2015   HCT 39.5  06/15/2015   MCV 90.1 06/15/2015   PLT 274.0 06/15/2015     STUDIES: No results found.  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  PLAN:    1.  Breast cancer: No evidence of disease. Continue letrozole completing 5 years of therapy in May 2019. Patient has had bilateral mastectomies, therefore no further mammograms are necessary. Patient finished her chemotherapy in January 2014. She did not require XRT given her bilateral mastectomies. Return to clinic in 6 months for routine evaluation. 2. Osteopenia:  Patient's most recent bone mineral density on June 15, 2015 was reported as -2.0. This is essentially unchanged from one year prior when it was -2.1. Repeat in June 2017.  3.  Tremors/Parkinson's: Stable. Treatment per neurology.   Patient expressed understanding and was in agreement with this plan. She also understands that She can call clinic at any time with any questions, concerns, or complaints.   Breast cancer [facility])   Staging form: Breast, AJCC 7th Edition     Clinical stage from 09/22/2015: Stage IIA (T1c, N1, M0) - Signed by Lloyd Huger, MD on 09/22/2015   Lloyd Huger, MD   09/22/2015 9:28 AM

## 2015-09-29 ENCOUNTER — Encounter: Payer: No Typology Code available for payment source | Admitting: Pain Medicine

## 2015-09-30 ENCOUNTER — Encounter: Payer: Self-pay | Admitting: Pain Medicine

## 2015-09-30 ENCOUNTER — Ambulatory Visit: Payer: Medicare Other | Attending: Pain Medicine | Admitting: Pain Medicine

## 2015-09-30 VITALS — BP 146/55 | HR 59 | Temp 98.7°F | Resp 18 | Ht 64.0 in | Wt 179.0 lb

## 2015-09-30 DIAGNOSIS — M25511 Pain in right shoulder: Secondary | ICD-10-CM | POA: Insufficient documentation

## 2015-09-30 DIAGNOSIS — F1120 Opioid dependence, uncomplicated: Secondary | ICD-10-CM | POA: Diagnosis not present

## 2015-09-30 DIAGNOSIS — Z5181 Encounter for therapeutic drug level monitoring: Secondary | ICD-10-CM

## 2015-09-30 DIAGNOSIS — M1990 Unspecified osteoarthritis, unspecified site: Secondary | ICD-10-CM | POA: Diagnosis not present

## 2015-09-30 DIAGNOSIS — Z79891 Long term (current) use of opiate analgesic: Secondary | ICD-10-CM | POA: Diagnosis not present

## 2015-09-30 DIAGNOSIS — M546 Pain in thoracic spine: Secondary | ICD-10-CM | POA: Insufficient documentation

## 2015-09-30 DIAGNOSIS — M549 Dorsalgia, unspecified: Secondary | ICD-10-CM

## 2015-09-30 DIAGNOSIS — M5136 Other intervertebral disc degeneration, lumbar region: Secondary | ICD-10-CM | POA: Insufficient documentation

## 2015-09-30 DIAGNOSIS — K219 Gastro-esophageal reflux disease without esophagitis: Secondary | ICD-10-CM | POA: Insufficient documentation

## 2015-09-30 DIAGNOSIS — Z853 Personal history of malignant neoplasm of breast: Secondary | ICD-10-CM | POA: Insufficient documentation

## 2015-09-30 DIAGNOSIS — G894 Chronic pain syndrome: Secondary | ICD-10-CM | POA: Diagnosis not present

## 2015-09-30 DIAGNOSIS — E669 Obesity, unspecified: Secondary | ICD-10-CM | POA: Diagnosis not present

## 2015-09-30 DIAGNOSIS — I10 Essential (primary) hypertension: Secondary | ICD-10-CM | POA: Insufficient documentation

## 2015-09-30 DIAGNOSIS — M25551 Pain in right hip: Secondary | ICD-10-CM | POA: Diagnosis present

## 2015-09-30 DIAGNOSIS — I129 Hypertensive chronic kidney disease with stage 1 through stage 4 chronic kidney disease, or unspecified chronic kidney disease: Secondary | ICD-10-CM | POA: Diagnosis not present

## 2015-09-30 DIAGNOSIS — M25552 Pain in left hip: Secondary | ICD-10-CM | POA: Diagnosis present

## 2015-09-30 DIAGNOSIS — E7800 Pure hypercholesterolemia, unspecified: Secondary | ICD-10-CM | POA: Insufficient documentation

## 2015-09-30 DIAGNOSIS — G20 Parkinson's disease: Secondary | ICD-10-CM | POA: Diagnosis not present

## 2015-09-30 DIAGNOSIS — M5416 Radiculopathy, lumbar region: Secondary | ICD-10-CM

## 2015-09-30 DIAGNOSIS — F1190 Opioid use, unspecified, uncomplicated: Secondary | ICD-10-CM | POA: Diagnosis not present

## 2015-09-30 MED ORDER — HYDROCODONE-ACETAMINOPHEN 5-325 MG PO TABS: 1.0000 | ORAL_TABLET | Freq: Three times a day (TID) | ORAL | Status: DC | PRN

## 2015-09-30 MED ORDER — MELOXICAM 7.5 MG PO TABS: 7.5000 mg | ORAL_TABLET | Freq: Every day | ORAL | Status: DC

## 2015-09-30 MED ORDER — GABAPENTIN 300 MG PO CAPS: 300.0000 mg | ORAL_CAPSULE | Freq: Two times a day (BID) | ORAL | Status: DC

## 2015-09-30 MED ORDER — GLUCOSAMINE CHONDROITIN COMPLX PO CAPS: 2.0000 | ORAL_CAPSULE | Freq: Every day | ORAL | Status: DC

## 2015-09-30 MED ORDER — MAGNESIUM OXIDE 400 (241.3 MG) MG PO TABS
1.0000 | ORAL_TABLET | Freq: Every day | ORAL | Status: DC
Start: 2015-09-30 — End: 2015-12-29

## 2015-09-30 MED ORDER — BACLOFEN 10 MG PO TABS: 10.0000 mg | ORAL_TABLET | Freq: Once | ORAL | Status: DC

## 2015-09-30 NOTE — Progress Notes (Signed)
Patient's Name: Wanda Martinez MRN: [id number] DOB: 1935-06-24 DOS: 09/30/2015  Primary Reason(s) for Visit: Encounter for Medication Management. CC: Back Pain and Hip Pain   HPI:   Wanda Martinez is a 80 y.o. year old, female patient, who returns today as an established patient. She has Hypercholesterolemia; Essential hypertension; DYSPNEA; CHEST PAIN; Frequent UTI; Cystocele; Breast cancer ([place]); Anemia; Tremor; Neck pain; Back pain; Toenail fungus; Skin lesions; Headache; Obesity (BMI 30-39.9); Dysuria; Sinusitis; Health care maintenance; Parkinsons ([place]); Fatigue; Breast cancer, right ([place]); Absolute anemia; Chronic kidney disease (CKD), stage II (mild); Cystocele, midline; Degeneration of intervertebral disc of lumbosacral region; Clinical depression; Enthesopathy of hip; Benign essential HTN; Incomplete bladder emptying; LBP (low back pain); Mixed incontinence; Arthritis, degenerative; HLD (hyperlipidemia); Bladder infection, chronic; Arm pain; Parkinson's disease ([place]); Pure hypercholesterolemia; and Hernia, rectovaginal on her problem list.. Her primarily concern today is the Back Pain and Hip Pain      Pharmacotherapy Review: Side-effects or Adverse reactions: None reported. Effectiveness: Described as relatively effective, allowing for increase in activities of daily living (ADL). Onset of action: Within expected pharmacological parameters. Duration of action: Within normal limits for medication. Peak effect: Timing and results are as within normal expected parameters. [place] PMP: Compliant with practice rules and regulations. DST: Compliant with practice rules and regulations. Lab work: No new labs ordered by our practice. Treatment compliance: Compliant. Substance Use Disorder (SUD) Risk Level: Low Planned course of action: Continue therapy as is.  Allergies: Wanda Martinez is allergic to vesicare.  Meds: The patient has a current medication list which includes the following  prescription(s): amlodipine, aspirin, baclofen, calcium carbonate, carbidopa-levodopa, citalopram, fish oil-omega-3 fatty acids, gabapentin, glucosamine chondroitin complx, hydrochlorothiazide, hydrocodone-acetaminophen, imipramine, letrozole, lisinopril, losartan, magnesium oxide, meloxicam, metoprolol succinate, multivitamin, omeprazole, hydrocodone-acetaminophen, hydrocodone-acetaminophen, pravastatin, simvastatin, and trazodone. Requested Prescriptions   Signed Prescriptions Disp Refills  . magnesium oxide (MAGNESIUM-OXIDE) 400 (241.3 MG) MG tablet 30 tablet 2    Sig: Take 1 tablet (400 mg total) by mouth daily.  . meloxicam (MOBIC) 7.5 MG tablet 30 tablet 2    Sig: Take 1 tablet (7.5 mg total) by mouth daily.  . Glucosamine-Chondroit-Vit C-Mn (GLUCOSAMINE CHONDROITIN COMPLX) CAPS 60 capsule 2    Sig: Take 2 capsules by mouth daily.  Marland Kitchen gabapentin (NEURONTIN) 300 MG capsule 60 capsule 2    Sig: Take 1 capsule (300 mg total) by mouth 2 (two) times daily.  . baclofen (LIORESAL) 10 MG tablet 30 each 2    Sig: Take 1 tablet (10 mg total) by mouth once.  Marland Kitchen HYDROcodone-acetaminophen (NORCO/VICODIN) 5-325 MG tablet 90 tablet 0    Sig: Take 1 tablet by mouth every 8 (eight) hours as needed for moderate pain.  Marland Kitchen HYDROcodone-acetaminophen (NORCO/VICODIN) 5-325 MG tablet 90 tablet 0    Sig: Take 1 tablet by mouth every 8 (eight) hours as needed for moderate pain.  Marland Kitchen HYDROcodone-acetaminophen (NORCO/VICODIN) 5-325 MG tablet 90 tablet 0    Sig: Take 1 tablet by mouth every 8 (eight) hours as needed for moderate pain.    ROS: Constitutional: Afebrile, no chills, well hydrated and well nourished Gastrointestinal: negative Musculoskeletal:negative Neurological: negative Behavioral/Psych: negative  PFSH: Medical:  Wanda Martinez  has a past medical history of GERD (gastroesophageal reflux disease); Hiatal hernia; HTN (hypertension); Chronic cystitis; Urinary incontinence; Arthritis; Anemia; Breast  cancer ([place]); Breast cancer ([place]); and Parkinson disease ([place]). Family: family history includes Colon polyps in her father; Diabetes in her father; Parkinson's disease in her mother; Stroke in her father and mother.  Surgical:  has past surgical history that includes Breast biopsy (2013); Tonsillectomy and adenoidectomy (79); Vesicovaginal fistula closure w/ TAH (1983); Breast surgery (1986); Breast enhancement surgery (1987); Breast implant removal; Breast implant removal (Right, 08/29/2012); Mastectomy (08/2012); and Abdominal hysterectomy. Tobacco:  reports that she has never smoked. She has never used smokeless tobacco. Alcohol:  reports that she does not drink alcohol. Drug:  reports that she does not use illicit drugs.  Physical Exam: Vitals:  Today's Vitals   09/30/15 1054 09/30/15 1055  BP: 146/55   Pulse: 59   Temp: 98.7 F (37.1 C)   TempSrc: Oral   Resp: 18   Height: 5\' 4"  (1.626 m)   Weight: 179 lb (81.194 kg)   SpO2: 99%   PainSc:  5   Calculated BMI: Body mass index is 30.71 kg/(m^2). General appearance: alert, cooperative, appears stated age, mild distress and mildly obese Eyes: conjunctivae/corneas clear. PERRL, EOM's intact. Fundi benign. Lungs: No evidence respiratory distress, no audible rales or ronchi and no use of accessory muscles of respiration Neck: no adenopathy, no carotid bruit, no JVD, supple, symmetrical, trachea midline and thyroid not enlarged, symmetric, no tenderness/mass/nodules Back: symmetric, no curvature. ROM normal. No CVA tenderness. Extremities: extremities normal, atraumatic, no cyanosis or edema Pulses: 2+ and symmetric Skin: Skin color, texture, turgor normal. No rashes or lesions Neurologic: Gait: Antalgic    Assessment: Encounter Diagnosis:  Primary Diagnosis: Chronic pain syndrome [G89.4]  Plan: Wanda Martinez was seen today for back pain and hip pain.  Diagnoses and all orders for this visit:  Chronic pain syndrome -     Discontinue:  HYDROcodone-acetaminophen (NORCO/VICODIN) 5-325 MG tablet; Take 1 tablet by mouth every 8 (eight) hours as needed for moderate pain. -     HYDROcodone-acetaminophen (NORCO/VICODIN) 5-325 MG tablet; Take 1 tablet by mouth every 8 (eight) hours as needed for moderate pain. -     HYDROcodone-acetaminophen (NORCO/VICODIN) 5-325 MG tablet; Take 1 tablet by mouth every 8 (eight) hours as needed for moderate pain. -     HYDROcodone-acetaminophen (NORCO/VICODIN) 5-325 MG tablet; Take 1 tablet by mouth every 8 (eight) hours as needed for moderate pain.  Opiate use  Uncomplicated opioid dependence ([place])  Long term current use of opiate analgesic -     Drugs of abuse screen w/o alc, rtn urine-sln; Future  Encounter for therapeutic drug level monitoring  Lumbar radicular pain  Upper back pain  Pain in joint of right shoulder  Other orders -     magnesium oxide (MAGNESIUM-OXIDE) 400 (241.3 MG) MG tablet; Take 1 tablet (400 mg total) by mouth daily. -     meloxicam (MOBIC) 7.5 MG tablet; Take 1 tablet (7.5 mg total) by mouth daily. -     Glucosamine-Chondroit-Vit C-Mn (GLUCOSAMINE CHONDROITIN COMPLX) CAPS; Take 2 capsules by mouth daily. -     gabapentin (NEURONTIN) 300 MG capsule; Take 1 capsule (300 mg total) by mouth 2 (two) times daily. -     baclofen (LIORESAL) 10 MG tablet; Take 1 tablet (10 mg total) by mouth once.     There are no Patient Instructions on file for this visit. Medications discontinued today:  Medications Discontinued During This Encounter  Medication Reason  . HYDROcodone-acetaminophen (NORCO) 5-325 MG per tablet Reorder  . MAGNESIUM-OXIDE 400 (241.3 MG) MG tablet Reorder  . meloxicam (MOBIC) 7.5 MG tablet Reorder  . Glucosamine-Chondroit-Vit C-Mn (GLUCOSAMINE CHONDROITIN COMPLX) CAPS Reorder  . gabapentin (NEURONTIN) 300 MG capsule Reorder  . baclofen (LIORESAL) 10 MG tablet Reorder  .  HYDROcodone-acetaminophen (NORCO/VICODIN) 5-325 MG tablet Reorder   Medications  administered today:  Ms. Halliday had no medications administered during this visit.  Primary Care Physician: Einar Pheasant, MD Location: [facility] Note by: Kathlen Brunswick. Dossie Arbour, M.D, DABA, DABAPM, DABPM, DABIPP, FIPP

## 2015-09-30 NOTE — Progress Notes (Signed)
Safety precautions to be maintained throughout the outpatient stay will include: orient to surroundings, keep bed in low position, maintain call bell within reach at all times, provide assistance with transfer out of bed and ambulation.  

## 2015-10-22 ENCOUNTER — Other Ambulatory Visit: Payer: Self-pay | Admitting: Pain Medicine

## 2015-10-29 ENCOUNTER — Other Ambulatory Visit: Payer: Self-pay

## 2015-10-29 ENCOUNTER — Other Ambulatory Visit: Payer: Self-pay | Admitting: Internal Medicine

## 2015-10-29 ENCOUNTER — Telehealth: Payer: Self-pay | Admitting: *Deleted

## 2015-10-29 MED ORDER — LETROZOLE 2.5 MG PO TABS: 2.5000 mg | ORAL_TABLET | Freq: Every day | ORAL | Status: DC

## 2015-10-29 NOTE — Telephone Encounter (Signed)
Escribed

## 2015-11-19 ENCOUNTER — Telehealth: Payer: Self-pay | Admitting: Pain Medicine

## 2015-11-19 NOTE — Telephone Encounter (Signed)
Increased pain / patient wants to come in for epidural next week ?

## 2015-11-23 ENCOUNTER — Telehealth: Payer: Self-pay | Admitting: Pain Medicine

## 2015-11-23 ENCOUNTER — Other Ambulatory Visit: Payer: Self-pay | Admitting: Internal Medicine

## 2015-11-23 NOTE — Telephone Encounter (Signed)
Please schedule patient for: Lumbar epidural steroid injection under fluoroscopic guidance and IV sedation.

## 2015-11-23 NOTE — Telephone Encounter (Signed)
Routed to Dr Dossie Arbour.

## 2015-11-23 NOTE — Telephone Encounter (Signed)
Wanda Martinez would like to have Epid this week if possible / please let Juliann Pulse know if this is ok

## 2015-12-06 ENCOUNTER — Encounter: Payer: No Typology Code available for payment source | Admitting: Internal Medicine

## 2015-12-24 ENCOUNTER — Other Ambulatory Visit: Payer: Self-pay | Admitting: Internal Medicine

## 2015-12-29 ENCOUNTER — Other Ambulatory Visit: Payer: Self-pay | Admitting: Pain Medicine

## 2015-12-29 ENCOUNTER — Ambulatory Visit
Admission: RE | Admit: 2015-12-29 | Discharge: 2015-12-29 | Disposition: A | Discharge status: Home / Self Care | Payer: Medicare Other | Source: Ambulatory Visit | Attending: Pain Medicine | Admitting: Pain Medicine

## 2015-12-29 ENCOUNTER — Encounter: Payer: Self-pay | Admitting: Pain Medicine

## 2015-12-29 ENCOUNTER — Ambulatory Visit (HOSPITAL_BASED_OUTPATIENT_CLINIC_OR_DEPARTMENT_OTHER): Payer: Medicare Other | Admitting: Pain Medicine

## 2015-12-29 VITALS — BP 144/68 | HR 71 | Temp 98.3°F | Resp 18 | Ht 64.0 in | Wt 179.0 lb

## 2015-12-29 DIAGNOSIS — M79601 Pain in right arm: Secondary | ICD-10-CM

## 2015-12-29 DIAGNOSIS — G8929 Other chronic pain: Secondary | ICD-10-CM

## 2015-12-29 DIAGNOSIS — M47896 Other spondylosis, lumbar region: Secondary | ICD-10-CM | POA: Diagnosis not present

## 2015-12-29 DIAGNOSIS — M545 Low back pain, unspecified: Secondary | ICD-10-CM | POA: Insufficient documentation

## 2015-12-29 DIAGNOSIS — M1990 Unspecified osteoarthritis, unspecified site: Secondary | ICD-10-CM | POA: Insufficient documentation

## 2015-12-29 DIAGNOSIS — M542 Cervicalgia: Secondary | ICD-10-CM | POA: Insufficient documentation

## 2015-12-29 DIAGNOSIS — M797 Fibromyalgia: Secondary | ICD-10-CM

## 2015-12-29 DIAGNOSIS — M79609 Pain in unspecified limb: Secondary | ICD-10-CM

## 2015-12-29 DIAGNOSIS — M792 Neuralgia and neuritis, unspecified: Secondary | ICD-10-CM

## 2015-12-29 DIAGNOSIS — M25552 Pain in left hip: Secondary | ICD-10-CM

## 2015-12-29 DIAGNOSIS — E7800 Pure hypercholesterolemia, unspecified: Secondary | ICD-10-CM | POA: Diagnosis not present

## 2015-12-29 DIAGNOSIS — I129 Hypertensive chronic kidney disease with stage 1 through stage 4 chronic kidney disease, or unspecified chronic kidney disease: Secondary | ICD-10-CM | POA: Insufficient documentation

## 2015-12-29 DIAGNOSIS — Z5181 Encounter for therapeutic drug level monitoring: Secondary | ICD-10-CM

## 2015-12-29 DIAGNOSIS — M25551 Pain in right hip: Secondary | ICD-10-CM

## 2015-12-29 DIAGNOSIS — F1190 Opioid use, unspecified, uncomplicated: Secondary | ICD-10-CM | POA: Insufficient documentation

## 2015-12-29 DIAGNOSIS — M79603 Pain in arm, unspecified: Secondary | ICD-10-CM

## 2015-12-29 DIAGNOSIS — M47812 Spondylosis without myelopathy or radiculopathy, cervical region: Secondary | ICD-10-CM

## 2015-12-29 DIAGNOSIS — Z79899 Other long term (current) drug therapy: Secondary | ICD-10-CM | POA: Diagnosis not present

## 2015-12-29 DIAGNOSIS — Z79891 Long term (current) use of opiate analgesic: Secondary | ICD-10-CM | POA: Diagnosis not present

## 2015-12-29 DIAGNOSIS — M4726 Other spondylosis with radiculopathy, lumbar region: Secondary | ICD-10-CM

## 2015-12-29 DIAGNOSIS — M25559 Pain in unspecified hip: Secondary | ICD-10-CM

## 2015-12-29 DIAGNOSIS — M791 Myalgia: Secondary | ICD-10-CM | POA: Insufficient documentation

## 2015-12-29 DIAGNOSIS — M47816 Spondylosis without myelopathy or radiculopathy, lumbar region: Secondary | ICD-10-CM | POA: Insufficient documentation

## 2015-12-29 DIAGNOSIS — M7918 Myalgia, other site: Secondary | ICD-10-CM

## 2015-12-29 DIAGNOSIS — G894 Chronic pain syndrome: Secondary | ICD-10-CM

## 2015-12-29 DIAGNOSIS — M5412 Radiculopathy, cervical region: Secondary | ICD-10-CM

## 2015-12-29 DIAGNOSIS — M159 Polyosteoarthritis, unspecified: Secondary | ICD-10-CM

## 2015-12-29 DIAGNOSIS — M150 Primary generalized (osteo)arthritis: Secondary | ICD-10-CM

## 2015-12-29 DIAGNOSIS — Z7189 Other specified counseling: Secondary | ICD-10-CM

## 2015-12-29 MED ORDER — HYDROCODONE-ACETAMINOPHEN 5-325 MG PO TABS: 1.0000 | ORAL_TABLET | Freq: Three times a day (TID) | ORAL | Status: DC | PRN

## 2015-12-29 MED ORDER — CYCLOBENZAPRINE HCL 10 MG PO TABS: 10.0000 mg | ORAL_TABLET | Freq: Every day | ORAL | Status: DC

## 2015-12-29 MED ORDER — BACLOFEN 10 MG PO TABS: 10.0000 mg | ORAL_TABLET | Freq: Once | ORAL | Status: DC

## 2015-12-29 MED ORDER — MAGNESIUM OXIDE 400 (241.3 MG) MG PO TABS: 1.0000 | ORAL_TABLET | Freq: Every day | ORAL | Status: DC

## 2015-12-29 MED ORDER — MELOXICAM 7.5 MG PO TABS: 7.5000 mg | ORAL_TABLET | Freq: Every day | ORAL | Status: DC

## 2015-12-29 MED ORDER — GABAPENTIN 300 MG PO CAPS: 300.0000 mg | ORAL_CAPSULE | Freq: Two times a day (BID) | ORAL | Status: DC

## 2015-12-29 NOTE — Progress Notes (Signed)
Safety precautions to be maintained throughout the outpatient stay will include: orient to surroundings, keep bed in low position, maintain call bell within reach at all times, provide assistance with transfer out of bed and ambulation. Did not bring pills for count.  Reminded to bring to every appointment

## 2015-12-29 NOTE — Patient Instructions (Addendum)
Trigger Point Injections Patient Information  Description: Trigger points are areas of muscle sensitive to touch which cause pain with movement, sometimes felt some distance from the site of palpation.  Usually the muscle containing these trigger points if felt as a tight band or knot.   The area of maximum tenderness or trigger point is identified, and after antiseptic preparation of the skin, a small needle is placed into this site.  Reproduction of the pain often occurs and numbing medicine (local anesthetic) is injected into the site, sometimes along with steroid preparation.  The entire block usually lasts less than 5 minutes.  Conditions which may be treated by trigger points:   Muscular pain and spasm  Nerve irritation  Preparation for the injection:  1. Do not eat any solid food or dairy products within 6 hours of your appointment. 2. You may drink clear liquids up to 2 hours before appointment.  Clear liquids include water, black coffee, juice or soda.  No milk or cream please. 3. You may take your regular medications, including pain medications, with a sip of water before your appointment.  Diabetics should hold regular insulin ( if take separately) and take 1/2 normal NPH dose the morning of the procedure.  Carry some sugar containing items with you to your appointment. 4. A driver must accompany you and be prepared to drive you home after your procedure.  5. Bring all your current medications with you. 6. An IV may be inserted and sedation may be given at the discretion of the physician.  7. A blood pressure cuff, EKG, and other monitors will often be applied during the procedure.  Some patients may need to have extra oxygen administered for a short period. 8. You will be asked to provide medical information, including your allergies and medications, prior to the procedure.  We must know immediately if you are taking blood thinners (like Coumadin/Warfarin) or if you are allergic to  IV iodine contrast (dye).  We must know if you could possibly be pregnant.  Possible side-effects:   Bleeding from needle site  Infection (rare, may require surgery)  Nerve injury (rare)  Numbness & tingling (temporary)  Punctured lung (if injection around chest)  Light-headedness (temporary)  Pain at injection site (several days)  Decreased blood pressure (rare, temporary)  Weakness in arm/leg (temporary)  Call if you experience:   Hive or difficulty breathing (go to the emergency room)  Inflammation or drainage at the injection site(s)  Please note:  Although the local anesthetic injected can often make your painful muscle feel good for several hours after the injection, the pain may return.  It takes 3-7 days for steroids to work.  You may not notice any pain relief for at least one week.  If effective, we will often do a series of injections spaced 3-6 weeks apart to maximally decrease your pain.  If you have any questions please call [phone] [facility]  A prescription for HYDROCODONE X 3 was given to you today.  A prescription for BACLOFEN, GABAPENTIN, MELOXICAM, FLEXERIL, MAGNESIUM was sent to your pharmacy and should be available for pickup today.

## 2015-12-30 ENCOUNTER — Encounter: Payer: Self-pay | Admitting: Pain Medicine

## 2015-12-30 ENCOUNTER — Ambulatory Visit: Payer: Medicare Other | Attending: Pain Medicine | Admitting: Pain Medicine

## 2015-12-30 VITALS — BP 144/68 | HR 60 | Temp 98.4°F | Resp 16 | Ht 64.0 in | Wt 179.0 lb

## 2015-12-30 DIAGNOSIS — M25519 Pain in unspecified shoulder: Secondary | ICD-10-CM | POA: Diagnosis present

## 2015-12-30 DIAGNOSIS — M1990 Unspecified osteoarthritis, unspecified site: Secondary | ICD-10-CM | POA: Insufficient documentation

## 2015-12-30 DIAGNOSIS — G8929 Other chronic pain: Secondary | ICD-10-CM | POA: Insufficient documentation

## 2015-12-30 DIAGNOSIS — M549 Dorsalgia, unspecified: Secondary | ICD-10-CM

## 2015-12-30 DIAGNOSIS — E7800 Pure hypercholesterolemia, unspecified: Secondary | ICD-10-CM | POA: Insufficient documentation

## 2015-12-30 DIAGNOSIS — E785 Hyperlipidemia, unspecified: Secondary | ICD-10-CM | POA: Insufficient documentation

## 2015-12-30 DIAGNOSIS — E669 Obesity, unspecified: Secondary | ICD-10-CM | POA: Insufficient documentation

## 2015-12-30 DIAGNOSIS — M546 Pain in thoracic spine: Secondary | ICD-10-CM | POA: Diagnosis not present

## 2015-12-30 DIAGNOSIS — M791 Myalgia: Secondary | ICD-10-CM | POA: Diagnosis not present

## 2015-12-30 DIAGNOSIS — I129 Hypertensive chronic kidney disease with stage 1 through stage 4 chronic kidney disease, or unspecified chronic kidney disease: Secondary | ICD-10-CM | POA: Insufficient documentation

## 2015-12-30 DIAGNOSIS — M7918 Myalgia, other site: Secondary | ICD-10-CM

## 2015-12-30 DIAGNOSIS — G20 Parkinson's disease: Secondary | ICD-10-CM | POA: Diagnosis not present

## 2015-12-30 DIAGNOSIS — M542 Cervicalgia: Secondary | ICD-10-CM | POA: Diagnosis not present

## 2015-12-30 DIAGNOSIS — M25559 Pain in unspecified hip: Secondary | ICD-10-CM | POA: Diagnosis not present

## 2015-12-30 MED ORDER — ROPIVACAINE HCL 2 MG/ML IJ SOLN
INTRAMUSCULAR | Status: AC
  Administered 2015-12-30: 11:00:00
  Filled 2015-12-30: qty 10

## 2015-12-30 MED ORDER — ROPIVACAINE HCL 2 MG/ML IJ SOLN: 9.0000 mL | Freq: Once | INTRAMUSCULAR | Status: DC

## 2015-12-30 MED ORDER — METHYLPREDNISOLONE ACETATE 80 MG/ML IJ SUSP: 80.0000 mg | Freq: Once | INTRAMUSCULAR | Status: DC

## 2015-12-30 MED ORDER — LIDOCAINE HCL (PF) 1 % IJ SOLN
INTRAMUSCULAR | Status: AC
  Administered 2015-12-30: 11:00:00
  Filled 2015-12-30: qty 5

## 2015-12-30 MED ORDER — TRIAMCINOLONE ACETONIDE 40 MG/ML IJ SUSP
INTRAMUSCULAR | Status: AC
  Administered 2015-12-30: 40 mg
  Filled 2015-12-30: qty 1

## 2015-12-30 NOTE — Progress Notes (Signed)
Patient's Name: Wanda Martinez MRN: [id number] DOB: November 10, 1935 DOS: 12/30/2015  Primary Reason(s) for Visit: Interventional Pain Management Treatment. CC: Shoulder Pain   Pre-Procedure Assessment:  Wanda Martinez is a 81 y.o. year old, female patient, seen today for interventional treatment. She has Hypercholesterolemia; Essential hypertension; DYSPNEA; CHEST PAIN; Frequent UTI; Cystocele; Breast cancer ([place]); Anemia; Tremor; Toenail fungus; Skin lesions; Headache; Obesity (BMI 30-39.9); Dysuria; Sinusitis; Health care maintenance; Parkinsons ([place]); Fatigue; Breast cancer, right ([place]); Absolute anemia; Chronic kidney disease (CKD), stage II (mild); Cystocele, midline; Clinical depression; Benign essential HTN; Incomplete bladder emptying; Mixed incontinence; Arthritis, degenerative; HLD (hyperlipidemia); Bladder infection, chronic; Parkinson's disease ([place]); Pure hypercholesterolemia; Hernia, rectovaginal; Long term current use of opiate analgesic; Long term prescription opiate use; Opiate use; Encounter for therapeutic drug level monitoring; Encounter for chronic pain management; Chronic low back pain; Lumbar spondylosis; Chronic hip pain; Chronic neck pain; Cervical spondylosis; Chronic cervical radicular pain; Chronic upper extremity pain; Chronic pain; Osteoarthritis; Diffuse myofascial pain syndrome; Right arm pain; Neurogenic pain; Chronic upper back pain (Right); and Myofascial pain syndrome (Right) (cervicothoracic) on her problem list.. Her primarily concern today is the Shoulder Pain   Today's Pain Score: 8 , clinically she looks like a 3-4/10. Reported level of pain is incompatible with clinical obrservations. This may be secondary to a possible lack of understanding on how the pain scale works. Pain Type: Chronic pain Pain Location: Scapula Pain Orientation: Right Pain Descriptors / Indicators: Aching Pain Frequency: Constant  Date of Last Visit: 12/29/15 Service Provided on Last  Visit: Med Refill  Verification of the correct person, correct site (including marking of site), and correct procedure were performed and confirmed by the patient.  Today's Vitals   12/30/15 1026 12/30/15 1029  BP: 117/90   Pulse: 63   Temp: 98.4 F (36.9 C)   Resp: 16   Height: 5\' 4"  (1.626 m)   Weight: 179 lb (81.194 kg)   SpO2: 99%   PainSc: 8  8   Calculated BMI: Body mass index is 30.71 kg/(m^2). Allergies: She is allergic to vesicare.. Primary Diagnosis: Myofascial pain syndrome [M79.1]  Procedure:  Type: Therapeutic Trigger Point Injection Region: Posterior Thoracic Level: Cervical thoracic   Laterality: Right-Sided Paraspinal  Indications: Myofascial Pain Syndrome Cervical Spine Pain, Thoracic Spine Pain and Upper Back Pain  Consent: Secured. Under the influence of no sedatives a written informed consent was obtained, after having provided information on the risks and possible complications. To fulfill our ethical and legal obligations, as recommended by the American Medical Association's Code of Ethics, we have provided information to the patient about our clinical impression; the nature and purpose of the treatment or procedure; the risks, benefits, and possible complications of the intervention; alternatives; the risk(s) and benefit(s) of the alternative treatment(s) or procedure(s); and the risk(s) and benefit(s) of doing nothing. The patient was provided information about the risks and possible complications associated with the procedure. These include, but are not limited to, failure to achieve desired goals, infection, bleeding, organ or nerve damage, allergic reactions, paralysis, and death. In addition, the patient was informed that Medicine is not an exact science; therefore, there is also the possibility of unforeseen risks and possible complications that may result in a catastrophic outcome. The patient indicated having understood very clearly. We have given the  patient no guarantees and we have made no promises. Enough time was given to the patient to ask questions, all of which were answered to the patient's satisfaction.  Pre-Procedure Preparation: Safety Precautions:  Allergies reviewed. Appropriate site, procedure, and patient were confirmed by following the Joint Commission's Universal Protocol (UP.01.01.01), in the form of a "Time Out". The patient was asked to confirm marked site and procedure, before commencing. The patient was asked about blood thinners, or active infections, both of which were denied. Patient was assessed for positional comfort and all pressure points were checked before starting procedure. Monitoring:  As per clinic protocol. Infection Control Precautions: Aseptic technique used. Standard Universal Precautions were taken as recommended by the Department of [employer] for Disease Control and Prevention (CDC). Standard pre-surgical skin prep was conducted. Respiratory hygiene and cough etiquette was practiced. Hand hygiene observed. Safe injection practices and needle disposal techniques followed. SDV (single dose vial) medications used. Medications properly checked for expiration dates and contaminants. Personal protective equipment (PPE) used: Sterile surgical gloves.  Anesthesia, Analgesia, Anxiolysis: Type: Local Anesthesia Local Anesthetic(s): Lidocaine 1% Route: Subcutaneous IV Access: None. Sedation: Declined. Indication(s):Patient declined.  Description of Procedure Process:  Time-out: "Time-out" completed before starting procedure, as per protocol. Position: Sitting Target Area: Trigger Point Approach: Ipsilateral approach. Area Prepped: Entire Posterior Cervicothoracic Region Prepping solution: ChloraPrep (2% chlorhexidine gluconate and 70% isopropyl alcohol) Safety Precautions: Aspiration looking for blood return was conducted prior to all injections. At no point did we inject any substances, as a needle  was being advanced. No attempts were made at seeking any paresthesias. Safe injection practices and needle disposal techniques used. Medications properly checked for expiration dates. SDV (single dose vial) medications used. Description of the Procedure: Protocol guidelines were followed. The patient was placed in position over the fluoroscopy table. The target area was identified and the area prepped in the usual manner. Skin desensitized using vapocoolant spray. Skin & deeper tissues infiltrated with local anesthetic. Appropriate amount of time allowed to pass for local anesthetics to take effect. The procedure needles were then advanced to the target area. Proper needle placement secured. Negative aspiration confirmed. Solution injected in intermittent fashion, asking for systemic symptoms every 0.5cc of injectate. The needles were then removed and the area cleansed, making sure to leave some of the prepping solution back to take advantage of its long term bactericidal properties. EBL: None Materials & Medications Used:  Needle(s) Used: 25g - 1.5" Needle(s) Medications Administered today: Ms. Stien had no medications administered during this visit.Please see chart orders for dosing details.  Imaging Guidance:  Type of Imaging Technique: None Indication(s): N/A  Antibiotics:  Type:  Antibiotics Given (last 72 hours)    None       Post-operative Assessment:  Complications: No immediate post-procedural complications were observed. Disposition: Return to clinic for follow-up evaluation. The patient tolerated the entire procedure well. The patient was provided with post-procedure discharge instructions, including a section on how to identify potential problems. Should any problems arise concerning this procedure, the patient was given instructions to immediately contact us, at any time, without hesitation. In any case, we plan to contact the patient by telephone for a follow-up status report  regarding this interventional procedure. Comments:  No additional relevant information.  Primary Care Physician: Einar Pheasant, MD Location: [facility] Note by: Kathlen Brunswick. Dossie Arbour, M.D, DABA, DABAPM, DABPM, DABIPP, FIPP  Disclaimer:  Medicine is not an exact science. The only guarantee in medicine is that nothing is guaranteed. It is important to note that the decision to proceed with this intervention was based on the information collected from the patient. The Data and conclusions were drawn from the patient's  questionnaire, the interview, and the physical examination. Because the information was provided in large part by the patient, it cannot be guaranteed that it has not been purposely or unconsciously manipulated. Every effort has been made to obtain as much relevant data as possible for this evaluation. It is important to note that the conclusions that lead to this procedure are derived in large part from the available data. Always take into account that the treatment will also be dependent on availability of resources and existing treatment guidelines, considered by other Pain Management Practitioners as being common knowledge and practice, at the time of the intervention. For Medico-Legal purposes, it is also important to point out that variation in procedural techniques and pharmacological choices are the acceptable norm. The indications, contraindications, technique, and results of the above procedure should only be interpreted and judged by a Board-Certified Interventional Pain Specialist with extensive familiarity and expertise in the same exact procedure and technique. Attempts at providing opinions without similar or greater experience and expertise than that of the treating physician will be considered as inappropriate and unethical, and shall result in a formal complaint to the state medical board and applicable specialty societies.

## 2015-12-30 NOTE — Progress Notes (Signed)
Safety precautions to be maintained throughout the outpatient stay will include: orient to surroundings, keep bed in low position, maintain call bell within reach at all times, provide assistance with transfer out of bed and ambulation.  

## 2015-12-31 ENCOUNTER — Telehealth: Payer: Self-pay

## 2015-12-31 NOTE — Progress Notes (Signed)
Patient's Name: Wanda Martinez MRN: [id number] DOB: [birth year] DOS: 12/29/2015  Primary Reason(s) for Visit: Encounter for Medication Management CC: Back Pain and Shoulder Pain   HPI:  Ms. Vestal is a 81 y.o. year old, female patient, who returns today as an established patient. She has Hypercholesterolemia; Essential hypertension; DYSPNEA; CHEST PAIN; Frequent UTI; Cystocele; Breast cancer ([place]); Anemia; Tremor; Toenail fungus; Skin lesions; Headache; Obesity (BMI 30-39.9); Dysuria; Sinusitis; Health care maintenance; Parkinsons ([place]); Fatigue; Breast cancer, right ([place]); Absolute anemia; Chronic kidney disease (CKD), stage II (mild); Cystocele, midline; Clinical depression; Benign essential HTN; Incomplete bladder emptying; Mixed incontinence; Arthritis, degenerative; HLD (hyperlipidemia); Bladder infection, chronic; Parkinson's disease ([place]); Pure hypercholesterolemia; Hernia, rectovaginal; Long term current use of opiate analgesic; Long term prescription opiate use; Opiate use; Encounter for therapeutic drug level monitoring; Encounter for chronic pain management; Chronic low back pain; Lumbar spondylosis; Chronic hip pain; Chronic neck pain; Cervical spondylosis; Chronic cervical radicular pain; Chronic upper extremity pain; Chronic pain; Osteoarthritis; Diffuse myofascial pain syndrome; Right arm pain; Neurogenic pain; Chronic upper back pain (Right); and Myofascial pain syndrome (Right) (cervicothoracic) on her problem list.. Her primarily concern today is the Back Pain and Shoulder Pain  The patient returns today for pharmacological management of her chronic pain. According to her she is doing rather well on her medication regimen except that now she has a flareup of her right upper back. Physical exam today would suggest that this may be either C5 radicular symptoms or a myofascial component to her pain. To evaluate this, we will go ahead and order x-rays of the cervical spine as well as an  MRI. I will have the patient return to clinic tomorrow and depending on the results of the x-ray we will either proceed with a cervical epidural steroid injection or trigger point.  Reported Pain Score: 8 , clinically she looks like a 3-4/10. Reported level is inconsistent with clinical obrservations. Pain Type: Chronic pain Pain Location: Scapula (shoulder, scapula pain) Pain Orientation: Right Pain Descriptors / Indicators: Aching Pain Frequency: Intermittent  Date of Last Visit: 09/30/15 Service Provided on Last Visit: Med Refill  Pharmacotherapy  Review:   Onset of action: Within expected pharmacological parameters Time to Peak effect: Timing and results are as within normal expected parameters Effectiveness: Described as relatively effective, allowing for increase in activities of daily living (ADL) % Relief: More than 50% Side-effects or Adverse reactions: None reported Duration of action: Within normal limits for medication [place] PMP: Compliant with practice rules and regulations UDS Results: Compliant UDS Interpretation: Patient appears to be compliant with practice rules and regulations Medication Assessment Form: Reviewed. Patient indicates being compliant with therapy Treatment compliance: Compliant Substance Use Disorder (SUD) Risk Level: Low Pharmacologic Plan: Continue therapy as is  Lab Work: Illicit Drugs No results found for: THCU, COCAINSCRNUR, PCPSCRNUR, MDMA, AMPHETMU, METHADONE, ETOH  Inflammation Markers No results found for: ESRSEDRATE, CRP  Renal Function Lab Results  Component Value Date   BUN 24* 06/15/2015   CREATININE 1.07 06/15/2015   GFRAA >60 10/08/2014   GFRNONAA >60 10/08/2014    Hepatic Function Lab Results  Component Value Date   AST 22 06/15/2015   ALT 18 06/15/2015   ALBUMIN 4.3 06/15/2015    Electrolytes Lab Results  Component Value Date   NA 135 06/15/2015   K 4.8 06/15/2015   CL 97 06/15/2015   CALCIUM 9.8 06/15/2015    MG 1.8 04/21/2013   Allergies:  Ms. Steinhilber is allergic to vesicare.  Meds:  The  patient has a current medication list which includes the following prescription(s): amlodipine, aspirin, baclofen, calcium carbonate, carbidopa-levodopa, citalopram, gabapentin, hydrochlorothiazide, hydrocodone-acetaminophen, hydrocodone-acetaminophen, hydrocodone-acetaminophen, letrozole, losartan, magnesium oxide, meloxicam, metoprolol succinate, multivitamin, omeprazole, pravastatin, trazodone, and cyclobenzaprine, and the following Facility-Administered Medications: methylprednisolone acetate and ropivacaine (pf) 2 mg/ml (0.2%). Requested Prescriptions   Signed Prescriptions Disp Refills  . HYDROcodone-acetaminophen (NORCO/VICODIN) 5-325 MG tablet 90 tablet 0    Sig: Take 1 tablet by mouth every 8 (eight) hours as needed for moderate pain or severe pain.  Marland Kitchen HYDROcodone-acetaminophen (NORCO/VICODIN) 5-325 MG tablet 90 tablet 0    Sig: Take 1 tablet by mouth every 8 (eight) hours as needed for moderate pain or severe pain.  Marland Kitchen HYDROcodone-acetaminophen (NORCO/VICODIN) 5-325 MG tablet 90 tablet 0    Sig: Take 1 tablet by mouth every 8 (eight) hours as needed for moderate pain or severe pain.  . meloxicam (MOBIC) 7.5 MG tablet 30 tablet 2    Sig: Take 1 tablet (7.5 mg total) by mouth daily.  . magnesium oxide (MAGNESIUM-OXIDE) 400 (241.3 Mg) MG tablet 30 tablet 2    Sig: Take 1 tablet (400 mg total) by mouth daily.  . cyclobenzaprine (FLEXERIL) 10 MG tablet 30 tablet 2    Sig: Take 1 tablet (10 mg total) by mouth at bedtime.  . baclofen (LIORESAL) 10 MG tablet 30 tablet 2    Sig: Take 1 tablet (10 mg total) by mouth once.  . gabapentin (NEURONTIN) 300 MG capsule 60 capsule 2    Sig: Take 1 capsule (300 mg total) by mouth 2 (two) times daily.    ROS:  Constitutional: Afebrile, no chills, well hydrated and well nourished Gastrointestinal: negative Musculoskeletal:negative Neurological:  negative Behavioral/Psych: negative  PFSH:  Medical:  Ms. Weitzel  has a past medical history of GERD (gastroesophageal reflux disease); Hiatal hernia; HTN (hypertension); Chronic cystitis; Urinary incontinence; Arthritis; Anemia; Breast cancer ([place]); Breast cancer ([place]); Parkinson disease ([place]); LBP (low back pain) (03/26/2014); Back pain (11/01/2013); Arm pain (07/26/2015); Neck pain (11/01/2013); Enthesopathy of hip (03/26/2014); and Degeneration of intervertebral disc of lumbosacral region (03/26/2014). Family: family history includes Colon polyps in her father; Diabetes in her father; Parkinson's disease in her mother; Stroke in her father and mother. Surgical:  has past surgical history that includes Breast biopsy (2013); Tonsillectomy and adenoidectomy (79); Vesicovaginal fistula closure w/ TAH (1983); Breast surgery (1986); Breast enhancement surgery (1987); Breast implant removal; Breast implant removal (Right, 08/29/2012); Mastectomy (08/2012); and Abdominal hysterectomy. Tobacco:  reports that she has never smoked. She has never used smokeless tobacco. Alcohol:  reports that she does not drink alcohol. Drug:  reports that she does not use illicit drugs.  Physical Exam:  Vitals:  Today's Vitals   12/29/15 1430 12/29/15 1432  BP:  144/68  Pulse: 71   Temp: 98.3 F (36.8 C)   Resp: 18   Height: 5\' 4"  (1.626 m)   Weight: 179 lb (81.194 kg)   SpO2: 100%   PainSc: 8  8   PainLoc: Shoulder    Calculated BMI: Body mass index is 30.71 kg/(m^2). General appearance: alert, cooperative, appears stated age, mild distress and moderately obese Eyes: PERLA Respiratory: No evidence respiratory distress, no audible rales or ronchi and no use of accessory muscles of respiration  Cervical Spine Inspection: Normal anatomy Alignment: Symetrical Palpation: WNL ROM: Decreased  Upper Extremities Inspection: No gross anomalies detected ROM: Adequate Sensory: Normal Motor: 5/5 Pulses:  Palpable  Lumbar Spine Inspection: No gross anomalies detected ROM: Decreased Palpation: Non-contributory  Lumbar Hyperextension and rotation: Non-contributory Patrick's Maneuver: Non-contributory Gait: WNL  Lower Extremities Inspection: No gross anomalies detected ROM: Adequate Sensory: Normal Motor: 5/5  Toe walk (S1): WNL  Heal walk (L5): WNL Pulses: Palpable  Assessment:  Encounter Diagnosis:  Primary Diagnosis: Chronic pain [G89.29]  Plan:   Interventional Therapies: Possible right-sided cervical epidural steroid injection under fluoroscopic guidance versus trigger points of the upper trapezius, rhomboid, and suprascapular muscles. This physician will be dependent on the results from the x-rays that we have ordered today.    Wanda Martinez was seen today for back pain and shoulder pain.  Diagnoses and all orders for this visit:  Chronic pain -     HYDROcodone-acetaminophen (NORCO/VICODIN) 5-325 MG tablet; Take 1 tablet by mouth every 8 (eight) hours as needed for moderate pain or severe pain. -     HYDROcodone-acetaminophen (NORCO/VICODIN) 5-325 MG tablet; Take 1 tablet by mouth every 8 (eight) hours as needed for moderate pain or severe pain. -     HYDROcodone-acetaminophen (NORCO/VICODIN) 5-325 MG tablet; Take 1 tablet by mouth every 8 (eight) hours as needed for moderate pain or severe pain. -     magnesium oxide (MAGNESIUM-OXIDE) 400 (241.3 Mg) MG tablet; Take 1 tablet (400 mg total) by mouth daily.  Long term current use of opiate analgesic  Long term prescription opiate use  Opiate use  Encounter for therapeutic drug level monitoring  Encounter for chronic pain management  Chronic low back pain  Osteoarthritis of spine with radiculopathy, lumbar region  Chronic hip pain, unspecified laterality  Chronic neck pain -     DG Cervical Spine Complete; Future  Cervical spondylosis -     DG Cervical Spine Complete; Future -     MR Cervical Spine Wo Contrast;  Future  Chronic cervical radicular pain -     MR Cervical Spine Wo Contrast; Future  Chronic upper extremity pain, unspecified laterality  Chronic pain syndrome  Primary osteoarthritis involving multiple joints -     meloxicam (MOBIC) 7.5 MG tablet; Take 1 tablet (7.5 mg total) by mouth daily.  Diffuse myofascial pain syndrome -     TRIGGER POINT INJECTION; Future -     cyclobenzaprine (FLEXERIL) 10 MG tablet; Take 1 tablet (10 mg total) by mouth at bedtime. -     baclofen (LIORESAL) 10 MG tablet; Take 1 tablet (10 mg total) by mouth once.  Right arm pain -     MR Cervical Spine Wo Contrast; Future  Neurogenic pain -     gabapentin (NEURONTIN) 300 MG capsule; Take 1 capsule (300 mg total) by mouth 2 (two) times daily.     Patient Instructions  Trigger Point Injections Patient Information  Description: Trigger points are areas of muscle sensitive to touch which cause pain with movement, sometimes felt some distance from the site of palpation.  Usually the muscle containing these trigger points if felt as a tight band or knot.   The area of maximum tenderness or trigger point is identified, and after antiseptic preparation of the skin, a small needle is placed into this site.  Reproduction of the pain often occurs and numbing medicine (local anesthetic) is injected into the site, sometimes along with steroid preparation.  The entire block usually lasts less than 5 minutes.  Conditions which may be treated by trigger points:   Muscular pain and spasm  Nerve irritation  Preparation for the injection:  1. Do not eat any solid food or dairy products within 6 hours of your  appointment. 2. You may drink clear liquids up to 2 hours before appointment.  Clear liquids include water, black coffee, juice or soda.  No milk or cream please. 3. You may take your regular medications, including pain medications, with a sip of water before your appointment.  Diabetics should hold regular  insulin ( if take separately) and take 1/2 normal NPH dose the morning of the procedure.  Carry some sugar containing items with you to your appointment. 4. A driver must accompany you and be prepared to drive you home after your procedure.  5. Bring all your current medications with you. 6. An IV may be inserted and sedation may be given at the discretion of the physician.  7. A blood pressure cuff, EKG, and other monitors will often be applied during the procedure.  Some patients may need to have extra oxygen administered for a short period. 8. You will be asked to provide medical information, including your allergies and medications, prior to the procedure.  We must know immediately if you are taking blood thinners (like Coumadin/Warfarin) or if you are allergic to IV iodine contrast (dye).  We must know if you could possibly be pregnant.  Possible side-effects:   Bleeding from needle site  Infection (rare, may require surgery)  Nerve injury (rare)  Numbness & tingling (temporary)  Punctured lung (if injection around chest)  Light-headedness (temporary)  Pain at injection site (several days)  Decreased blood pressure (rare, temporary)  Weakness in arm/leg (temporary)  Call if you experience:   Hive or difficulty breathing (go to the emergency room)  Inflammation or drainage at the injection site(s)  Please note:  Although the local anesthetic injected can often make your painful muscle feel good for several hours after the injection, the pain may return.  It takes 3-7 days for steroids to work.  You may not notice any pain relief for at least one week.  If effective, we will often do a series of injections spaced 3-6 weeks apart to maximally decrease your pain.  If you have any questions please call [phone] [facility]  A prescription for HYDROCODONE X 3 was given to you today.  A prescription for BACLOFEN, GABAPENTIN,  MELOXICAM, FLEXERIL, MAGNESIUM was sent to your pharmacy and should be available for pickup today.  Medications discontinued today:  Medications Discontinued During This Encounter  Medication Reason  . baclofen (LIORESAL) 10 MG tablet Error  . ciprofloxacin (CIPRO) 500 MG tablet Error  . citalopram (CELEXA) 20 MG tablet Error  . imipramine (TOFRANIL) 25 MG tablet Error  . lisinopril (PRINIVIL,ZESTRIL) 40 MG tablet Error  . simvastatin (ZOCOR) 10 MG tablet Error  . HYDROcodone-acetaminophen (NORCO/VICODIN) 5-325 MG tablet Reorder  . HYDROcodone-acetaminophen (NORCO/VICODIN) 5-325 MG tablet Reorder  . HYDROcodone-acetaminophen (NORCO/VICODIN) 5-325 MG tablet Reorder  . meloxicam (MOBIC) 7.5 MG tablet Reorder  . magnesium oxide (MAGNESIUM-OXIDE) 400 (241.3 MG) MG tablet Reorder  . baclofen (LIORESAL) 10 MG tablet Reorder  . gabapentin (NEURONTIN) 300 MG capsule Reorder  . Glucosamine-Chondroit-Vit C-Mn (GLUCOSAMINE CHONDROITIN COMPLX) CAPS   . fish oil-omega-3 fatty acids 1000 MG capsule    Medications administered today:  Ms. Muscat had no medications administered during this visit.  Primary Care Physician: Einar Pheasant, MD Location: [facility] Note by: Kathlen Brunswick. Dossie Arbour, M.D, DABA, DABAPM, DABPM, DABIPP, FIPP

## 2015-12-31 NOTE — Telephone Encounter (Signed)
Pt states no problem with procedure- doing well

## 2016-01-07 LAB — TOXASSURE SELECT 13 (MW), URINE: PDF: 0

## 2016-01-13 ENCOUNTER — Ambulatory Visit: Payer: Medicare Other | Attending: Pain Medicine | Admitting: Pain Medicine

## 2016-01-13 ENCOUNTER — Encounter: Payer: Self-pay | Admitting: Pain Medicine

## 2016-01-13 VITALS — BP 133/74 | HR 62 | Temp 98.4°F | Resp 16 | Ht 64.0 in | Wt 179.0 lb

## 2016-01-13 DIAGNOSIS — G8929 Other chronic pain: Secondary | ICD-10-CM | POA: Diagnosis not present

## 2016-01-13 DIAGNOSIS — I129 Hypertensive chronic kidney disease with stage 1 through stage 4 chronic kidney disease, or unspecified chronic kidney disease: Secondary | ICD-10-CM | POA: Insufficient documentation

## 2016-01-13 DIAGNOSIS — M791 Myalgia: Secondary | ICD-10-CM | POA: Diagnosis not present

## 2016-01-13 DIAGNOSIS — G20 Parkinson's disease: Secondary | ICD-10-CM | POA: Diagnosis not present

## 2016-01-13 DIAGNOSIS — E7800 Pure hypercholesterolemia, unspecified: Secondary | ICD-10-CM | POA: Diagnosis not present

## 2016-01-13 DIAGNOSIS — M47816 Spondylosis without myelopathy or radiculopathy, lumbar region: Secondary | ICD-10-CM | POA: Insufficient documentation

## 2016-01-13 DIAGNOSIS — E669 Obesity, unspecified: Secondary | ICD-10-CM | POA: Diagnosis not present

## 2016-01-13 DIAGNOSIS — M549 Dorsalgia, unspecified: Secondary | ICD-10-CM | POA: Diagnosis present

## 2016-01-13 DIAGNOSIS — M545 Low back pain: Secondary | ICD-10-CM | POA: Insufficient documentation

## 2016-01-13 DIAGNOSIS — M7918 Myalgia, other site: Secondary | ICD-10-CM

## 2016-01-13 MED ORDER — TRIAMCINOLONE ACETONIDE 40 MG/ML IJ SUSP
INTRAMUSCULAR | Status: AC
  Filled 2016-01-13: qty 1

## 2016-01-13 MED ORDER — TRIAMCINOLONE ACETONIDE 40 MG/ML IJ SUSP: 40.0000 mg | Freq: Once | INTRAMUSCULAR | Status: DC

## 2016-01-13 MED ORDER — ROPIVACAINE HCL 2 MG/ML IJ SOLN
INTRAMUSCULAR | Status: AC
  Filled 2016-01-13: qty 10

## 2016-01-13 MED ORDER — ROPIVACAINE HCL 2 MG/ML IJ SOLN: 9.0000 mL | Freq: Once | INTRAMUSCULAR | Status: DC

## 2016-01-13 NOTE — Progress Notes (Signed)
Safety precautions to be maintained throughout the outpatient stay will include: orient to surroundings, keep bed in low position, maintain call bell within reach at all times, provide assistance with transfer out of bed and ambulation.  

## 2016-01-13 NOTE — Patient Instructions (Signed)
Pain Management Discharge Instructions  General Discharge Instructions :  If you need to reach your doctor call: Monday-Friday 8:00 am - 4:00 pm at [phone] or toll free [phone].  After clinic hours [phone] to have operator reach doctor.  Bring all of your medication bottles to all your appointments in the pain clinic.  To cancel or reschedule your appointment with Pain Management please remember to call 24 hours in advance to avoid a fee.  Refer to the educational materials which you have been given on: General Risks, I had my Procedure. Discharge Instructions, Post Sedation.  Post Procedure Instructions:  The drugs you were given will stay in your system until tomorrow, so for the next 24 hours you should not drive, make any legal decisions or drink any alcoholic beverages.  You may eat anything you prefer, but it is better to start with liquids then soups and crackers, and gradually work up to solid foods.  Please notify your doctor immediately if you have any unusual bleeding, trouble breathing or pain that is not related to your normal pain.  Depending on the type of procedure that was done, some parts of your body may feel week and/or numb.  This usually clears up by tonight or the next day.  Walk with the use of an assistive device or accompanied by an adult for the 24 hours.  You may use ice on the affected area for the first 24 hours.  Put ice in a Ziploc bag and cover with a towel and place against area 15 minutes on 15 minutes off.  You may switch to heat after 24 hours.Trigger Point Injections Patient Information  Description: Trigger points are areas of muscle sensitive to touch which cause pain with movement, sometimes felt some distance from the site of palpation.  Usually the muscle containing these trigger points if felt as a tight band or knot.   The area of maximum tenderness or trigger point is identified, and after antiseptic preparation of the skin, a  small needle is placed into this site.  Reproduction of the pain often occurs and numbing medicine (local anesthetic) is injected into the site, sometimes along with steroid preparation.  The entire block usually lasts less than 5 minutes.  Conditions which may be treated by trigger points:   Muscular pain and spasm  Nerve irritation  Preparation for the injection:  1. Do not eat any solid food or dairy products within 6 hours of your appointment. 2. You may drink clear liquids up to 2 hours before appointment.  Clear liquids include water, black coffee, juice or soda.  No milk or cream please. 3. You may take your regular medications, including pain medications, with a sip of water before your appointment.  Diabetics should hold regular insulin ( if take separately) and take 1/2 normal NPH dose the morning of the procedure.  Carry some sugar containing items with you to your appointment. 4. A driver must accompany you and be prepared to drive you home after your procedure.  5. Bring all your current medications with you. 6. An IV may be inserted and sedation may be given at the discretion of the physician.  7. A blood pressure cuff, EKG, and other monitors will often be applied during the procedure.  Some patients may need to have extra oxygen administered for a short period. 8. You will be asked to provide medical information, including your allergies and medications, prior to the procedure.  We must know immediately if you  are taking blood thinners (like Coumadin/Warfarin) or if you are allergic to IV iodine contrast (dye).  We must know if you could possibly be pregnant.  Possible side-effects:   Bleeding from needle site  Infection (rare, may require surgery)  Nerve injury (rare)  Numbness & tingling (temporary)  Punctured lung (if injection around chest)  Light-headedness (temporary)  Pain at injection site (several days)  Decreased blood pressure (rare,  temporary)  Weakness in arm/leg (temporary)  Call if you experience:   Hive or difficulty breathing (go to the emergency room)  Inflammation or drainage at the injection site(s)  Please note:  Although the local anesthetic injected can often make your painful muscle feel good for several hours after the injection, the pain may return.  It takes 3-7 days for steroids to work.  You may not notice any pain relief for at least one week.  If effective, we will often do a series of injections spaced 3-6 weeks apart to maximally decrease your pain.  If you have any questions please call [phone] [facility]

## 2016-01-13 NOTE — Progress Notes (Signed)
Patient's Name: Wanda Martinez MRN: [id number] DOB: 04-Jun-1935 DOS: 01/13/2016  Primary Reason(s) for Visit: Interventional Pain Management Treatment. CC: Back Pain   Pre-Procedure Assessment:  [name] is a 81 y.o. year old, female patient, seen today for interventional treatment. She has Hypercholesterolemia; Essential hypertension; DYSPNEA; CHEST PAIN; Frequent UTI; Cystocele; Breast cancer ([place]); Anemia; Tremor; Toenail fungus; Skin lesions; Headache; Obesity (BMI 30-39.9); Dysuria; Sinusitis; Health care maintenance; Parkinsons ([place]); Fatigue; Breast cancer, right ([place]); Absolute anemia; Chronic kidney disease (CKD), stage II (mild); Cystocele, midline; Clinical depression; Benign essential HTN; Incomplete bladder emptying; Mixed incontinence; HLD (hyperlipidemia); Bladder infection, chronic; Parkinson's disease ([place]); Pure hypercholesterolemia; Hernia, rectovaginal; Long term current use of opiate analgesic; Long term prescription opiate use; Opiate use; Encounter for therapeutic drug level monitoring; Encounter for chronic pain management; Chronic low back pain; Lumbar spondylosis; Chronic hip pain; Chronic neck pain; Cervical spondylosis; Chronic cervical radicular pain (Right); Chronic upper extremity pain (Right); Chronic pain; Osteoarthritis; Diffuse myofascial pain syndrome; Neurogenic pain; Chronic upper back pain (Right); and Myofascial pain syndrome (Right) (cervicothoracic) on her problem list.. Her primarily concern today is the Back Pain   Today's Initial Pain Score: 2/10. Reported level of pain is compatible with clinical observation Pain Type: Chronic pain Pain Location: Scapula Pain Orientation: Right Pain Descriptors / Indicators: Aching Pain Frequency: Constant  Post-procedure Pain Score: 1   Date of Last Visit: 12/30/15 Service Provided on Last Visit: Evaluation (TPI)  Post-Procedure Assessment  Procedure done on last visit: Right trapezius muscle trigger point  injection, without sedation. Side-effects or Adverse reactions: None reported Sedation: No sedation used during procedure  Results: Ultra-Short Term Relief (First 1 hour after procedure): 90 %  Possibly the results is influenced by the pharmacodynamic effect of the local anesthetic, as well as that of the intravenous analgesics and/or sedatives, when used Short Term Relief (Initial 4-6 hrs after procedure): 90 % Short-term relief confirms injected site to be the source of pain Long Term Relief : 95 % Long-term benefit would suggest an inflammatory etiology to the pain   Current Relief (Now):  95%  Persistent relief would suggest effective anti-inflammatory effects from steroids Interpretation of Results: The results would suggest for this therapy to be a good alternative to palliatively manage this shoulder pain.   Note: Verification of the correct person, correct site (including marking of site), and correct procedure were performed and confirmed by the patient.  Today's Vitals   01/13/16 1358 01/13/16 1400 01/13/16 1425 01/13/16 1430  BP: 136/84  126/88 133/74  Pulse: 63  64 62  Temp: 98.4 F (36.9 C)     Resp: 18  18 16   Height: 5\' 4"  (1.626 m)     Weight: 179 lb (81.194 kg)     SpO2: 100%  100% 100%  PainSc: 2  2  3  1    PainLoc: Back     Calculated BMI: Body mass index is 30.71 kg/(m^2). Allergies: She is allergic to vesicare.. Primary Diagnosis: Myofascial pain syndrome [M79.1]  Procedure:  Type: Therapeutic Trigger Point Injection of the right supraspinatus muscle Region: Cervicothoracic Level: Shoulder , posterior aspect. Laterality: Right-Sided    Indications: 1. Myofascial pain syndrome (Right) (cervicothoracic)     In addition, [name] has Chronic low back pain; Lumbar spondylosis; Chronic hip pain; Chronic neck pain; Cervical spondylosis; Chronic cervical radicular pain (Right); Chronic upper extremity pain (Right); Chronic pain; Osteoarthritis; Diffuse  myofascial pain syndrome; Neurogenic pain; Chronic upper back pain (Right); and Myofascial pain syndrome (Right) (cervicothoracic)  on her pertinent problem list.  Consent: Secured. Under the influence of no sedatives a written informed consent was obtained, after having provided information on the risks and possible complications. To fulfill our ethical and legal obligations, as recommended by the American Medical Association's Code of Ethics, we have provided information to the patient about our clinical impression; the nature and purpose of the treatment or procedure; the risks, benefits, and possible complications of the intervention; alternatives; the risk(s) and benefit(s) of the alternative treatment(s) or procedure(s); and the risk(s) and benefit(s) of doing nothing. The patient was provided information about the risks and possible complications associated with the procedure. These include, but are not limited to, failure to achieve desired goals, infection, bleeding, organ or nerve damage, allergic reactions, paralysis, and death. In addition, the patient was informed that Medicine is not an exact science; therefore, there is also the possibility of unforeseen risks and possible complications that may result in a catastrophic outcome. The patient indicated having understood very clearly. We have given the patient no guarantees and we have made no promises. Enough time was given to the patient to ask questions, all of which were answered to the patient's satisfaction.  Pre-Procedure Preparation: Safety Precautions: Allergies reviewed. Appropriate site, procedure, and patient were confirmed by following the Joint Commission's Universal Protocol (UP.01.01.01), in the form of a "Time Out". The patient was asked to confirm marked site and procedure, before commencing. The patient was asked about blood thinners, or active infections, both of which were denied. Patient was assessed for positional comfort and  all pressure points were checked before starting procedure. Monitoring:  As per clinic protocol. Infection Control Precautions: Aseptic technique used. Standard Universal Precautions were taken as recommended by the Department of [facility] for Disease Control and Prevention (CDC). Standard pre-surgical skin prep was conducted. Respiratory hygiene and cough etiquette was practiced. Hand hygiene observed. Safe injection practices and needle disposal techniques followed. SDV (single dose vial) medications used. Medications properly checked for expiration dates and contaminants. Personal protective equipment (PPE) used: Sterile surgical gloves.  Anesthesia, Analgesia, Anxiolysis: Type: Local Anesthesia Local Anesthetic(s): Lidocaine 1% Route: Subcutaneous IV Access: None. Sedation: Declined. Indication(s):Patient declined.  Description of Procedure Process:  Time-out: "Time-out" completed before starting procedure, as per protocol. Position: Sitting Target Area: Trigger Point Approach: Ipsilateral approach. Area Prepped: Entire Posterior Cervicothoracic Region Prepping solution: ChloraPrep (2% chlorhexidine gluconate and 70% isopropyl alcohol) Safety Precautions: Aspiration looking for blood return was conducted prior to all injections. At no point did we inject any substances, as a needle was being advanced. No attempts were made at seeking any paresthesias. Safe injection practices and needle disposal techniques used. Medications properly checked for expiration dates. SDV (single dose vial) medications used.   Description of the Procedure: Protocol guidelines were followed. The patient was placed in position over the fluoroscopy table. The target area was identified and the area prepped in the usual manner. Skin desensitized using vapocoolant spray. Skin & deeper tissues infiltrated with local anesthetic. Appropriate amount of time allowed to pass for local anesthetics to take effect.  The procedure needles were then advanced to the target area. Proper needle placement secured. Negative aspiration confirmed. Solution injected in intermittent fashion, asking for systemic symptoms every 0.5cc of injectate. The needles were then removed and the area cleansed, making sure to leave some of the prepping solution back to take advantage of its long term bactericidal properties. EBL: None Materials & Medications Used:  Needle(s) Used: 25g - 1.5" Needle(s)  Medications Administered today: Ms. Borruso had no medications administered during this visit.Please see chart orders for dosing details.  Imaging Guidance:  Type of Imaging Technique: None Indication(s): N/A  Antibiotics:  Type:  Antibiotics Given (last 72 hours)    None       Post-operative Assessment:  Complications: No immediate post-procedural complications were observed. Disposition: Return to clinic for follow-up evaluation. The patient tolerated the entire procedure well. The patient was provided with post-procedure discharge instructions, including a section on how to identify potential problems. Should any problems arise concerning this procedure, the patient was given instructions to immediately contact us, at any time, without hesitation. In any case, we plan to contact the patient by telephone for a follow-up status report regarding this interventional procedure. Comments:  No additional relevant information.  Disclaimer:  Medicine is not an Chief Strategy Officer. The only guarantee in medicine is that nothing is guaranteed. It is important to note that the decision to proceed with this intervention was based on the information collected from the patient. The Data and conclusions were drawn from the patient's questionnaire, the interview, and the physical examination. Because the information was provided in large part by the patient, it cannot be guaranteed that it has not been purposely or unconsciously manipulated. Every  effort has been made to obtain as much relevant data as possible for this evaluation. It is important to note that the conclusions that lead to this procedure are derived in large part from the available data. Always take into account that the treatment will also be dependent on availability of resources and existing treatment guidelines, considered by other Pain Management Practitioners as being common knowledge and practice, at the time of the intervention. For Medico-Legal purposes, it is also important to point out that variation in procedural techniques and pharmacological choices are the acceptable norm. The indications, contraindications, technique, and results of the above procedure should only be interpreted and judged by a Board-Certified Interventional Pain Specialist with extensive familiarity and expertise in the same exact procedure and technique. Attempts at providing opinions without similar or greater experience and expertise than that of the treating physician will be considered as inappropriate and unethical, and shall result in a formal complaint to the state medical board and applicable specialty societies.   Plan of Care  Pharmacotherapy (Medications Ordered): Meds ordered this encounter  Medications  . ropivacaine (PF) 2 mg/ml (0.2%) (NAROPIN) 2 MG/ML epidural    Sig:     TICE, KORI: cabinet override  . triamcinolone acetonide (KENALOG-40) 40 MG/ML injection    Sig:     TICE, KORI: cabinet override  . ropivacaine (PF) 2 mg/ml (0.2%) (NAROPIN) epidural 9 mL    Sig:   . triamcinolone acetonide (KENALOG-40) injection 40 mg    Sig:     Lab-work & Procedure Ordered: Orders Placed This Encounter  Procedures  . TRIGGER POINT INJECTION    Scheduling Instructions:     Area: Shoulder (deltoid)     Side: Right     Sedation: None     Timeframe: Today    Order Specific Question:  Where will this procedure be performed?    Answer:  [facility]    Imaging  Ordered: None  Interventional Therapies: Scheduled: None at this time. PRN Procedures: Trigger point injections as needed.    Referral(s) or Consult(s): None required at this time.  Medications administered during this visit: Ms. Denney had no medications administered during this visit.  Future Appointments Date Time Provider  [place]  01/20/2016 11:00 AM Einar Pheasant, MD [facility] None  03/08/2016 10:45 AM Lloyd Huger, MD [facility] None  03/27/2016 2:00 PM Milinda Pointer, MD [facility] None  05/04/2016 11:00 AM Noreene Filbert, MD [facility] None    Primary Care Physician: Einar Pheasant, MD Location: [facility] Note by: Kathlen Brunswick. Dossie Arbour, M.D, DABA, DABAPM, DABPM, DABIPP, FIPP

## 2016-01-20 ENCOUNTER — Encounter: Payer: No Typology Code available for payment source | Admitting: Internal Medicine

## 2016-01-25 ENCOUNTER — Telehealth: Payer: Self-pay | Admitting: *Deleted

## 2016-01-25 NOTE — Telephone Encounter (Signed)
Asking if she can get the shingles shot while she is taking her cancer med. Looks like she is on [employer]

## 2016-01-25 NOTE — Telephone Encounter (Signed)
Thanks fine.  Thank you

## 2016-01-25 NOTE — Telephone Encounter (Signed)
Pt informed OK to get shingles shot

## 2016-01-26 ENCOUNTER — Encounter: Payer: No Typology Code available for payment source | Admitting: Internal Medicine

## 2016-01-27 ENCOUNTER — Other Ambulatory Visit: Payer: Self-pay | Admitting: Internal Medicine

## 2016-01-27 ENCOUNTER — Other Ambulatory Visit: Payer: Self-pay | Admitting: *Deleted

## 2016-01-27 MED ORDER — LETROZOLE 2.5 MG PO TABS: 2.5000 mg | ORAL_TABLET | Freq: Every day | ORAL | Status: DC

## 2016-02-01 ENCOUNTER — Ambulatory Visit (INDEPENDENT_AMBULATORY_CARE_PROVIDER_SITE_OTHER): Payer: Medicare Other | Admitting: Internal Medicine

## 2016-02-01 ENCOUNTER — Encounter: Payer: Self-pay | Admitting: Internal Medicine

## 2016-02-01 VITALS — BP 120/80 | HR 74 | Temp 98.0°F | Resp 18 | Ht 64.0 in | Wt 179.1 lb

## 2016-02-01 DIAGNOSIS — R1084 Generalized abdominal pain: Secondary | ICD-10-CM

## 2016-02-01 DIAGNOSIS — G20 Parkinson's disease: Secondary | ICD-10-CM

## 2016-02-01 DIAGNOSIS — I10 Essential (primary) hypertension: Secondary | ICD-10-CM | POA: Diagnosis not present

## 2016-02-01 DIAGNOSIS — C50919 Malignant neoplasm of unspecified site of unspecified female breast: Secondary | ICD-10-CM

## 2016-02-01 DIAGNOSIS — N771 Vaginitis, vulvitis and vulvovaginitis in diseases classified elsewhere: Secondary | ICD-10-CM | POA: Diagnosis not present

## 2016-02-01 DIAGNOSIS — Z0000 Encounter for general adult medical examination without abnormal findings: Secondary | ICD-10-CM

## 2016-02-01 DIAGNOSIS — N898 Other specified noninflammatory disorders of vagina: Secondary | ICD-10-CM

## 2016-02-01 DIAGNOSIS — N899 Noninflammatory disorder of vagina, unspecified: Secondary | ICD-10-CM | POA: Diagnosis not present

## 2016-02-01 DIAGNOSIS — N182 Chronic kidney disease, stage 2 (mild): Secondary | ICD-10-CM

## 2016-02-01 DIAGNOSIS — E669 Obesity, unspecified: Secondary | ICD-10-CM

## 2016-02-01 DIAGNOSIS — E7800 Pure hypercholesterolemia, unspecified: Secondary | ICD-10-CM

## 2016-02-01 DIAGNOSIS — R5383 Other fatigue: Secondary | ICD-10-CM

## 2016-02-01 DIAGNOSIS — N760 Acute vaginitis: Secondary | ICD-10-CM

## 2016-02-01 LAB — URINALYSIS, ROUTINE W REFLEX MICROSCOPIC
BILIRUBIN URINE: NEGATIVE
HGB URINE DIPSTICK: NEGATIVE
Leukocytes, UA: NEGATIVE
Nitrite: NEGATIVE
RBC / HPF: NONE SEEN (ref 0–?)
Specific Gravity, Urine: 1.015 (ref 1.000–1.030)
URINE GLUCOSE: NEGATIVE
Urobilinogen, UA: 0.2 (ref 0.0–1.0)
pH: 7 (ref 5.0–8.0)

## 2016-02-01 LAB — BASIC METABOLIC PANEL
BUN: 23 mg/dL (ref 6–23)
CALCIUM: 9.9 mg/dL (ref 8.4–10.5)
CHLORIDE: 95 meq/L — AB (ref 96–112)
CO2: 32 meq/L (ref 19–32)
CREATININE: 1.1 mg/dL (ref 0.40–1.20)
GFR: 51.02 mL/min — ABNORMAL LOW (ref >60.00)
GLUCOSE: 83 mg/dL (ref 70–99)
Potassium: 4.8 mEq/L (ref 3.5–5.1)
Sodium: 132 mEq/L — ABNORMAL LOW (ref 135–145)

## 2016-02-01 LAB — LIPID PANEL
Cholesterol: 220 mg/dL — ABNORMAL HIGH (ref 0–200)
HDL: 57.8 mg/dL (ref >39.00)
LDL Cholesterol: 137 mg/dL — ABNORMAL HIGH (ref 0–99)
NONHDL: 161.75
Total CHOL/HDL Ratio: 4
Triglycerides: 125 mg/dL (ref 0.0–149.0)
VLDL: 25 mg/dL (ref 0.0–40.0)

## 2016-02-01 LAB — HEPATIC FUNCTION PANEL
ALK PHOS: 67 U/L (ref 39–117)
ALT: 5 U/L (ref 0–35)
AST: 21 U/L (ref 0–37)
Albumin: 4.6 g/dL (ref 3.5–5.2)
BILIRUBIN DIRECT: 0.1 mg/dL (ref 0.0–0.3)
BILIRUBIN TOTAL: 0.5 mg/dL (ref 0.2–1.2)
Total Protein: 7.5 g/dL (ref 6.0–8.3)

## 2016-02-01 LAB — TSH: TSH: 2.69 u[IU]/mL (ref 0.35–4.50)

## 2016-02-01 LAB — VITAMIN B12: VITAMIN B 12: 881 pg/mL (ref 211–911)

## 2016-02-01 NOTE — Assessment & Plan Note (Signed)
Physical today 02/01/16.  Colonoscopy 03/01/12.  Mammograms - no longer performed.  Sees oncology.  S/p bilateral mastectomy

## 2016-02-01 NOTE — Progress Notes (Signed)
Patient ID: Wanda Martinez, female   DOB: 13-Jul-1935, 81 y.o.   MRN: [id number]   Subjective:    Patient ID: Wanda Martinez, female    DOB: 1935/07/25, 81 y.o.   MRN: [id number]  HPI  Patient with past history of hypercholesterolemia, hypertension, GERD, parkinsons and breast cancer.  She comes in today to follow up on these issues as well as for a complete physical exam.  She was having pain in her right shoulder.  Is s/p injections.  Has helped.  She does report her toes feel "partially numb" and will draw.  Left foot worse than right.  Her right hand is worse than the left - drawing.  Due f/u soon with Dr Melrose Nakayama.  Tolerating her Parkinsons medications.  No chest pain or tightness.  No sob.  No acid reflux reported.  Stable abdominal discomfort.  Some urinary pressure.  Some vaginal irritation.  States this is constant.  Uses otc monistat prn.  Cranberry tablet has helped urinary issues.  Last uti 11/2015.     Past Medical History  Diagnosis Date  . GERD (gastroesophageal reflux disease)   . Hiatal hernia   . HTN (hypertension)   . Chronic cystitis   . Urinary incontinence     mixed   . Arthritis   . Anemia   . Breast cancer ([place])     Masectomy - left - 1986   . Breast cancer [facility])     Mastectomy-right -2014  . Parkinson disease ([place])   . LBP (low back pain) 03/26/2014  . Back pain 11/01/2013  . Arm pain 07/26/2015  . Neck pain 11/01/2013  . Enthesopathy of hip 03/26/2014  . Degeneration of intervertebral disc of lumbosacral region 03/26/2014  . Arthritis, degenerative 03/26/2014   Past Surgical History  Procedure Laterality Date  . Breast biopsy  2013  . Tonsillectomy and adenoidectomy  79  . Vesicovaginal fistula closure w/ tah  1983  . Breast surgery  1986    s/p left mastectomy  . Breast enhancement surgery  1987  . Breast implant removal    . Breast implant removal Right 08/29/2012  . Mastectomy  08/2012    right  . Abdominal hysterectomy     Family History    Problem Relation Age of Onset  . Diabetes Father   . Stroke Father   . Colon polyps Father   . Stroke Mother   . Parkinson's disease Mother    Social History   Social History  . Marital Status: Married    Spouse Name: N/A  . Number of Children: 3  . Years of Education: N/A   Social History Main Topics  . Smoking status: Never Smoker   . Smokeless tobacco: Never Used     Comment: tobacco use - no  . Alcohol Use: No  . Drug Use: No  . Sexual Activity: Not Asked   Other Topics Concern  . None   Social History Narrative    Outpatient Encounter Prescriptions as of 02/01/2016  Medication Sig  . amLODipine (NORVASC) 2.5 MG tablet TAKE 1 TABLET BY MOUTH ONCE DAILY.  Marland Kitchen aspirin 81 MG EC tablet Take 81 mg by mouth daily as needed.    . baclofen (LIORESAL) 10 MG tablet Take 1 tablet (10 mg total) by mouth once.  . Calcium Carbonate (CALCIUM 600) 1500 MG TABS Take 1 tablet by mouth daily.    . carbidopa-levodopa (SINEMET IR) 25-100 MG per tablet Take 1 tablet by mouth 3 (three)  times daily.  . citalopram (CELEXA) 20 MG tablet Take 20 mg by mouth daily.  . cyclobenzaprine (FLEXERIL) 10 MG tablet Take 1 tablet (10 mg total) by mouth at bedtime.  . gabapentin (NEURONTIN) 300 MG capsule Take 1 capsule (300 mg total) by mouth 2 (two) times daily.  . hydrochlorothiazide (HYDRODIURIL) 25 MG tablet TAKE 1 TABLET BY MOUTH ONCE DAILY.  Marland Kitchen HYDROcodone-acetaminophen (NORCO/VICODIN) 5-325 MG tablet Take 1 tablet by mouth every 8 (eight) hours as needed for moderate pain or severe pain.  Marland Kitchen HYDROcodone-acetaminophen (NORCO/VICODIN) 5-325 MG tablet Take 1 tablet by mouth every 8 (eight) hours as needed for moderate pain or severe pain.  Marland Kitchen HYDROcodone-acetaminophen (NORCO/VICODIN) 5-325 MG tablet Take 1 tablet by mouth every 8 (eight) hours as needed for moderate pain or severe pain.  Marland Kitchen letrozole (FEMARA) 2.5 MG tablet Take 1 tablet (2.5 mg total) by mouth daily.  Marland Kitchen losartan (COZAAR) 100 MG tablet TAKE  1 TABLET BY MOUTH ONCE DAILY.  . magnesium oxide (MAGNESIUM-OXIDE) 400 (241.3 Mg) MG tablet Take 1 tablet (400 mg total) by mouth daily.  . meloxicam (MOBIC) 7.5 MG tablet Take 1 tablet (7.5 mg total) by mouth daily.  . metoprolol succinate (TOPROL-XL) 25 MG 24 hr tablet TAKE 1 TABLET BY MOUTH TWICE DAILY  . Multiple Vitamin (MULTIVITAMIN) tablet Take 1 tablet by mouth daily.  Marland Kitchen omeprazole (PRILOSEC) 20 MG capsule TAKE 1 CAPSULE BY MOUTH TWICE DAILY FOR REFLUX.  . pravastatin (PRAVACHOL) 10 MG tablet TAKE 1 TABLET BY MOUTH AT BEDTIME FOR CHOLESTEROL  . traZODone (DESYREL) 50 MG tablet TAKE ONE TABLET BY MOUTH AT BEDTIME. (Patient taking differently: TAKE ONE TABLET BY MOUTH AT BEDTIME. PRN)   Facility-Administered Encounter Medications as of 02/01/2016  Medication  . methylPREDNISolone acetate (DEPO-MEDROL) injection 80 mg  . ropivacaine (PF) 2 mg/ml (0.2%) (NAROPIN) epidural 9 mL  . ropivacaine (PF) 2 mg/ml (0.2%) (NAROPIN) epidural 9 mL  . triamcinolone acetonide (KENALOG-40) injection 40 mg    Review of Systems  Constitutional: Negative for appetite change and unexpected weight change.  HENT: Negative for congestion and sinus pressure.   Eyes: Negative for pain and visual disturbance.  Respiratory: Negative for cough, chest tightness and shortness of breath.   Cardiovascular: Positive for leg swelling. Negative for chest pain and palpitations.  Gastrointestinal: Negative for nausea, vomiting, abdominal pain and diarrhea.  Genitourinary: Negative for dysuria and difficulty urinating.       Some vaginal irritation.   Musculoskeletal: Negative for joint swelling.       Some spasm in her back if she stands for a long period of time.  Right shoulder better.    Skin: Negative for color change and rash.  Neurological: Negative for dizziness, light-headedness and headaches.  Hematological: Negative for adenopathy. Does not bruise/bleed easily.  Psychiatric/Behavioral: Negative for dysphoric  mood and agitation.       Objective:     Blood pressure rechecked by me:  120/78  Physical Exam  Constitutional: She appears well-developed and well-nourished. No distress.  HENT:  Nose: Nose normal.  Mouth/Throat: Oropharynx is clear and moist.  Eyes: Conjunctivae are normal. Right eye exhibits no discharge. Left eye exhibits no discharge.  Neck: Neck supple. No thyromegaly present.  Cardiovascular: Normal rate and regular rhythm.   Pulmonary/Chest: Breath sounds normal. No respiratory distress. She has no wheezes.  S/p bilateral mastectomy.  Has left breast implant.  No axillary adenopathy noted.    Abdominal: Soft. Bowel sounds are normal. There is no tenderness.  Genitourinary:  Normal external genitalia.  Vaginal vault without lesions.   S/p hysterectomy.  Could not appreciate any adnexal masses or tenderness.  Some discharge present.  Sent off wet prep.    Musculoskeletal: She exhibits no edema or tenderness.  Lymphadenopathy:    She has no cervical adenopathy.    BP 120/80 mmHg  Pulse 74  Temp(Src) 98 F (36.7 C) (Oral)  Resp 18  Ht 5\' 4"  (1.626 m)  Wt 179 lb 2 oz (81.251 kg)  BMI 30.73 kg/m2  SpO2 97%  LMP 12/18/1981 Wt Readings from Last 3 Encounters:  02/01/16 179 lb 2 oz (81.251 kg)  01/13/16 179 lb (81.194 kg)  12/30/15 179 lb (81.194 kg)     Lab Results  Component Value Date   WBC 7.6 06/15/2015   HGB 13.0 06/15/2015   HCT 39.5 06/15/2015   PLT 274.0 06/15/2015   GLUCOSE 83 02/01/2016   CHOL 220* 02/01/2016   TRIG 125.0 02/01/2016   HDL 57.80 02/01/2016   LDLDIRECT 90.0 02/17/2015   LDLCALC 137* 02/01/2016   ALT 5 02/01/2016   AST 21 02/01/2016   NA 132* 02/01/2016   K 4.8 02/01/2016   CL 95* 02/01/2016   CREATININE 1.10 02/01/2016   BUN 23 02/01/2016   CO2 32 02/01/2016   TSH 2.69 02/01/2016    Dg Cervical Spine Complete  12/29/2015  CLINICAL DATA:  Neck pain for a few weeks, no known injury, initial encounter EXAM: CERVICAL SPINE -  COMPLETE 4+ VIEW COMPARISON:  None. FINDINGS: Seven cervical segments are well visualized. Disc space narrowing and osteophytic changes are noted from C3-C7. Multilevel facet hypertrophic changes are seen. No acute fracture or acute facet abnormality is noted. No gross soft tissue abnormality is seen. The odontoid is within normal limits. IMPRESSION: Degenerative change without acute abnormality. Electronically Signed   By: Inez Catalina M.D.   On: 12/29/2015 16:13       Assessment & Plan:   Problem List Items Addressed This Visit    Breast cancer [facility])    Sees Dr Grayland Ormond.  See his note for details.  Follow.        Chronic kidney disease (CKD), stage II (mild)    Last Cr stable.  Follow metabolic panel.       Essential hypertension    Blood pressure under good control.  Continue same medication regimen.  Follow pressures.  Follow metabolic panel.        Fatigue   Health care maintenance - Primary    Physical today 02/01/16.  Colonoscopy 03/01/12.  Mammograms - no longer performed.  Sees oncology.  S/p bilateral mastectomy      Hypercholesterolemia   Obesity (BMI 30-39.9)    Discussed diet and exercise.  Follow.       Parkinsons ([place])    Followed by Dr Melrose Nakayama.  Noticed the drawing sensation as outlined.  Has f/u soon.  Plans to discuss with him.       Pure hypercholesterolemia    Low cholesterol diet and exercise.  Follow lipid panel and liver function tests.  On pravastatin.        Vaginitis    Pelvic exam performed.  Wet prep sent.  Follow.        Other Visit Diagnoses    Generalized abdominal pain        Relevant Orders    Urinalysis, Routine w reflex microscopic (not at [facility]) (Completed)    CULTURE, URINE COMPREHENSIVE (Completed)    Vaginal irritation  Einar Pheasant, MD

## 2016-02-01 NOTE — Progress Notes (Signed)
Pre-visit discussion using our clinic review tool. No additional management support is needed unless otherwise documented below in the visit note.  

## 2016-02-02 ENCOUNTER — Other Ambulatory Visit: Payer: Self-pay | Admitting: Internal Medicine

## 2016-02-02 DIAGNOSIS — E871 Hypo-osmolality and hyponatremia: Secondary | ICD-10-CM

## 2016-02-02 LAB — WET PREP BY MOLECULAR PROBE
Candida species: NEGATIVE
GARDNERELLA VAGINALIS: NEGATIVE
Trichomonas vaginosis: NEGATIVE

## 2016-02-02 NOTE — Progress Notes (Signed)
Order placed for f/u sodium.  ?

## 2016-02-03 LAB — CULTURE, URINE COMPREHENSIVE
Colony Count: NO GROWTH
ORGANISM ID, BACTERIA: NO GROWTH

## 2016-02-11 DIAGNOSIS — L630 Alopecia (capitis) totalis: Secondary | ICD-10-CM | POA: Diagnosis not present

## 2016-02-11 DIAGNOSIS — D225 Melanocytic nevi of trunk: Secondary | ICD-10-CM | POA: Diagnosis not present

## 2016-02-11 DIAGNOSIS — L578 Other skin changes due to chronic exposure to nonionizing radiation: Secondary | ICD-10-CM | POA: Diagnosis not present

## 2016-02-11 DIAGNOSIS — Z1283 Encounter for screening for malignant neoplasm of skin: Secondary | ICD-10-CM | POA: Diagnosis not present

## 2016-02-11 DIAGNOSIS — L821 Other seborrheic keratosis: Secondary | ICD-10-CM | POA: Diagnosis not present

## 2016-02-13 ENCOUNTER — Encounter: Payer: Self-pay | Admitting: Internal Medicine

## 2016-02-13 DIAGNOSIS — N760 Acute vaginitis: Secondary | ICD-10-CM | POA: Insufficient documentation

## 2016-02-13 NOTE — Assessment & Plan Note (Signed)
Followed by Dr Melrose Nakayama.  Noticed the drawing sensation as outlined.  Has f/u soon.  Plans to discuss with him.

## 2016-02-13 NOTE — Assessment & Plan Note (Signed)
Low cholesterol diet and exercise.  Follow lipid panel and liver function tests.  On pravastatin.   

## 2016-02-13 NOTE — Assessment & Plan Note (Signed)
Sees Dr Grayland Ormond.  See his note for details.  Follow.

## 2016-02-13 NOTE — Assessment & Plan Note (Signed)
Pelvic exam performed.  Wet prep sent.  Follow.

## 2016-02-13 NOTE — Assessment & Plan Note (Signed)
Discussed diet and exercise.  Follow.  

## 2016-02-13 NOTE — Assessment & Plan Note (Signed)
Blood pressure under good control.  Continue same medication regimen.  Follow pressures.  Follow metabolic panel.   

## 2016-02-13 NOTE — Assessment & Plan Note (Signed)
Last Cr stable.  Follow metabolic panel.

## 2016-02-14 ENCOUNTER — Other Ambulatory Visit (INDEPENDENT_AMBULATORY_CARE_PROVIDER_SITE_OTHER): Payer: Medicare Other

## 2016-02-14 DIAGNOSIS — E871 Hypo-osmolality and hyponatremia: Secondary | ICD-10-CM | POA: Diagnosis not present

## 2016-02-14 LAB — SODIUM: Sodium: 134 mEq/L — ABNORMAL LOW (ref 135–145)

## 2016-02-15 ENCOUNTER — Encounter: Payer: Self-pay | Admitting: *Deleted

## 2016-02-21 DIAGNOSIS — F3289 Other specified depressive episodes: Secondary | ICD-10-CM | POA: Diagnosis not present

## 2016-02-21 DIAGNOSIS — I10 Essential (primary) hypertension: Secondary | ICD-10-CM | POA: Diagnosis not present

## 2016-02-21 DIAGNOSIS — G20 Parkinson's disease: Secondary | ICD-10-CM | POA: Diagnosis not present

## 2016-02-21 DIAGNOSIS — M542 Cervicalgia: Secondary | ICD-10-CM | POA: Diagnosis not present

## 2016-02-21 DIAGNOSIS — R251 Tremor, unspecified: Secondary | ICD-10-CM | POA: Diagnosis not present

## 2016-02-21 DIAGNOSIS — E669 Obesity, unspecified: Secondary | ICD-10-CM | POA: Diagnosis not present

## 2016-02-25 ENCOUNTER — Other Ambulatory Visit: Payer: Self-pay | Admitting: Internal Medicine

## 2016-02-28 ENCOUNTER — Other Ambulatory Visit: Payer: Self-pay | Admitting: Pain Medicine

## 2016-03-08 ENCOUNTER — Inpatient Hospital Stay: Payer: Medicare Other | Admitting: Oncology

## 2016-03-27 ENCOUNTER — Ambulatory Visit: Payer: Medicare Other | Attending: Pain Medicine | Admitting: Pain Medicine

## 2016-03-27 ENCOUNTER — Telehealth: Payer: Self-pay | Admitting: Internal Medicine

## 2016-03-27 ENCOUNTER — Other Ambulatory Visit: Payer: Self-pay | Admitting: Internal Medicine

## 2016-03-27 ENCOUNTER — Other Ambulatory Visit: Payer: Self-pay | Admitting: Pain Medicine

## 2016-03-27 VITALS — BP 121/69 | HR 68 | Temp 98.5°F | Resp 16 | Ht 64.0 in | Wt 180.0 lb

## 2016-03-27 DIAGNOSIS — Z6830 Body mass index (BMI) 30.0-30.9, adult: Secondary | ICD-10-CM | POA: Diagnosis not present

## 2016-03-27 DIAGNOSIS — B351 Tinea unguium: Secondary | ICD-10-CM | POA: Diagnosis not present

## 2016-03-27 DIAGNOSIS — M25559 Pain in unspecified hip: Secondary | ICD-10-CM | POA: Diagnosis not present

## 2016-03-27 DIAGNOSIS — G20 Parkinson's disease: Secondary | ICD-10-CM | POA: Insufficient documentation

## 2016-03-27 DIAGNOSIS — E7800 Pure hypercholesterolemia, unspecified: Secondary | ICD-10-CM | POA: Insufficient documentation

## 2016-03-27 DIAGNOSIS — M1991 Primary osteoarthritis, unspecified site: Secondary | ICD-10-CM | POA: Diagnosis not present

## 2016-03-27 DIAGNOSIS — R0600 Dyspnea, unspecified: Secondary | ICD-10-CM | POA: Diagnosis not present

## 2016-03-27 DIAGNOSIS — R079 Chest pain, unspecified: Secondary | ICD-10-CM | POA: Insufficient documentation

## 2016-03-27 DIAGNOSIS — F329 Major depressive disorder, single episode, unspecified: Secondary | ICD-10-CM | POA: Insufficient documentation

## 2016-03-27 DIAGNOSIS — M549 Dorsalgia, unspecified: Secondary | ICD-10-CM | POA: Diagnosis present

## 2016-03-27 DIAGNOSIS — K219 Gastro-esophageal reflux disease without esophagitis: Secondary | ICD-10-CM | POA: Diagnosis not present

## 2016-03-27 DIAGNOSIS — Z79891 Long term (current) use of opiate analgesic: Secondary | ICD-10-CM

## 2016-03-27 DIAGNOSIS — R51 Headache: Secondary | ICD-10-CM | POA: Diagnosis not present

## 2016-03-27 DIAGNOSIS — M545 Low back pain: Secondary | ICD-10-CM

## 2016-03-27 DIAGNOSIS — N8110 Cystocele, unspecified: Secondary | ICD-10-CM | POA: Insufficient documentation

## 2016-03-27 DIAGNOSIS — K449 Diaphragmatic hernia without obstruction or gangrene: Secondary | ICD-10-CM | POA: Insufficient documentation

## 2016-03-27 DIAGNOSIS — D649 Anemia, unspecified: Secondary | ICD-10-CM | POA: Diagnosis not present

## 2016-03-27 DIAGNOSIS — M79606 Pain in leg, unspecified: Secondary | ICD-10-CM | POA: Diagnosis present

## 2016-03-27 DIAGNOSIS — M150 Primary generalized (osteo)arthritis: Secondary | ICD-10-CM

## 2016-03-27 DIAGNOSIS — Z5181 Encounter for therapeutic drug level monitoring: Secondary | ICD-10-CM | POA: Diagnosis not present

## 2016-03-27 DIAGNOSIS — M542 Cervicalgia: Secondary | ICD-10-CM | POA: Insufficient documentation

## 2016-03-27 DIAGNOSIS — M47816 Spondylosis without myelopathy or radiculopathy, lumbar region: Secondary | ICD-10-CM | POA: Insufficient documentation

## 2016-03-27 DIAGNOSIS — M159 Polyosteoarthritis, unspecified: Secondary | ICD-10-CM

## 2016-03-27 DIAGNOSIS — I129 Hypertensive chronic kidney disease with stage 1 through stage 4 chronic kidney disease, or unspecified chronic kidney disease: Secondary | ICD-10-CM | POA: Diagnosis not present

## 2016-03-27 DIAGNOSIS — G8929 Other chronic pain: Secondary | ICD-10-CM | POA: Diagnosis not present

## 2016-03-27 DIAGNOSIS — R32 Unspecified urinary incontinence: Secondary | ICD-10-CM | POA: Insufficient documentation

## 2016-03-27 DIAGNOSIS — M7918 Myalgia, other site: Secondary | ICD-10-CM

## 2016-03-27 DIAGNOSIS — M792 Neuralgia and neuritis, unspecified: Secondary | ICD-10-CM

## 2016-03-27 DIAGNOSIS — Z853 Personal history of malignant neoplasm of breast: Secondary | ICD-10-CM | POA: Insufficient documentation

## 2016-03-27 DIAGNOSIS — E669 Obesity, unspecified: Secondary | ICD-10-CM | POA: Diagnosis not present

## 2016-03-27 DIAGNOSIS — M797 Fibromyalgia: Secondary | ICD-10-CM

## 2016-03-27 MED ORDER — PRAVASTATIN SODIUM 20 MG PO TABS: 20.0000 mg | ORAL_TABLET | Freq: Every day | ORAL | Status: DC

## 2016-03-27 MED ORDER — HYDROCODONE-ACETAMINOPHEN 5-325 MG PO TABS: 1.0000 | ORAL_TABLET | Freq: Three times a day (TID) | ORAL | Status: DC | PRN

## 2016-03-27 MED ORDER — MAGNESIUM OXIDE 400 (241.3 MG) MG PO TABS: 1.0000 | ORAL_TABLET | Freq: Every day | ORAL | Status: DC

## 2016-03-27 MED ORDER — CYCLOBENZAPRINE HCL 10 MG PO TABS: 10.0000 mg | ORAL_TABLET | Freq: Every day | ORAL | Status: DC

## 2016-03-27 MED ORDER — MELOXICAM 7.5 MG PO TABS: 7.5000 mg | ORAL_TABLET | Freq: Every day | ORAL | Status: DC

## 2016-03-27 MED ORDER — GABAPENTIN 300 MG PO CAPS
300.0000 mg | ORAL_CAPSULE | Freq: Two times a day (BID) | ORAL | Status: DC
Start: 2016-03-27 — End: 2016-08-02

## 2016-03-27 NOTE — Progress Notes (Signed)
Patient's Name: Wanda Martinez  Patient type: Established  MRN: [id number]  Service setting: Ambulatory outpatient  DOB: [birth year]  Location: [facility]  DOS: 03/27/2016  Primary Care Physician: Einar Pheasant, MD  Note by: Kathlen Brunswick. Dossie Arbour, M.D, DABA, DABAPM, DABPM, DABIPP, FIPP  Referring Physician: Einar Pheasant, MD  Specialty: Board-Certified Interventional Pain Management     Primary Reason(s) for Visit: Encounter for prescription drug management (Level of risk: moderate) CC: Back Pain; Hip Pain; Leg Pain; and Shoulder Pain   HPI  Wanda Martinez is a 81 y.o. year old, female patient, who returns today as an established patient. She has Hypercholesterolemia; Essential hypertension; DYSPNEA; CHEST PAIN; Frequent UTI; Cystocele; Breast cancer ([place]); Anemia; Tremor; Toenail fungus; Skin lesions; Headache; Obesity (BMI 30-39.9); Dysuria; Sinusitis; Health care maintenance; Parkinsons ([place]); Fatigue; Breast cancer, right ([place]); Absolute anemia; Chronic kidney disease (CKD), stage II (mild); Cystocele, midline; Clinical depression; Benign essential HTN; Incomplete bladder emptying; Mixed incontinence; HLD (hyperlipidemia); Bladder infection, chronic; Parkinson's disease ([place]); Pure hypercholesterolemia; Hernia, rectovaginal; Long term current use of opiate analgesic; Long term prescription opiate use; Opiate use; Encounter for therapeutic drug level monitoring; Encounter for chronic pain management; Chronic low back pain; Lumbar spondylosis; Chronic hip pain; Chronic neck pain; Cervical spondylosis; Chronic cervical radicular pain (Right); Chronic upper extremity pain (Right); Chronic pain; Osteoarthritis; Diffuse myofascial pain syndrome; Neurogenic pain; Chronic upper back pain (Right); Myofascial pain syndrome (Right) (cervicothoracic); Vaginitis; Adiposity; Facet syndrome, lumbar; and Malignant neoplasm of right female breast ([place]) on her problem list.. Her  primarily concern today is the Back Pain; Hip Pain; Leg Pain; and Shoulder Pain   Pain Assessment: Self-Reported Pain Score: 4  Reported level is compatible with observation Pain Type: Chronic pain Pain Location: Back Pain Orientation: Right, Lower Pain Descriptors / Indicators: Dull, Aching, Constant Pain Frequency: Constant  The patient returns to the clinics today for pharmacological management of her chronic pain. In addition, she also returns for postprocedure evaluation on a trigger point injection done in the area of the right shoulder. She indicates that she still doing extremely well from this last trigger point and she is not having much pain in that area. However, she is still having significant pain in the lower back which continues to be her primary source of pain. In the past we have done some diagnostic lumbar facet blocks with good results. Unfortunately, she has not attained long-term benefits. Because of this, we will be scheduling her for lumbar facet radiofrequency ablation under fluoroscopic guidance and IV sedation. We will be starting with the right side since this is her worse.  Date of Last Visit: 01/13/16 Service Provided on Last Visit: Procedure (right shoulder trigger point)  Controlled Substance Pharmacotherapy Assessment  Analgesic: Hydrocodone/APAP 5/325 one tablet every 8 hours (15 mg/day) Pill Count: Hydrocodone 84/90; filled 03/25/16 (out of town and couldn't get filled til late). MME/day: 15 mg/day.  Pharmacokinetics: Onset of action (Liberation/Absorption): Within expected pharmacological parameters Time to Peak effect (Distribution): Timing and results are as within normal expected parameters Duration of action (Metabolism/Excretion): Within normal limits for medication Pharmacodynamics: Analgesic Effect: More than 50% Activity Facilitation: Medication(s) allow patient to sit, stand, walk, and do the basic ADLs Perceived Effectiveness: Described as  relatively effective, allowing for increase in activities of daily living (ADL) Side-effects or Adverse reactions: None reported Monitoring: [place] PMP: Online review of the past 51-month period conducted. Compliant with practice rules and regulations UDS Results/interpretation: The patient's last available UDS was done on 12/29/2015  and it came back within normal limits with no unexpected results. Medication Assessment Form: Reviewed. Patient indicates being compliant with therapy Treatment compliance: Compliant Risk Assessment: Aberrant Behavior: None observed today Substance Use Disorder (SUD) Risk Level: Low Risk of opioid abuse or dependence: 0.7-3.0% with doses ? 36 MME/day and 6.1-26% with doses ? 120 MME/day. Opioid Risk Tool (ORT) Score: Total Score: 1 Low Risk for SUD (Score <3) Depression Scale Score: PHQ-2: PHQ-2 Total Score: 0 No depression (0) PHQ-9: PHQ-9 Total Score: 0 No depression (0-4)  Pharmacologic Plan: No change in therapy, at this time  Laboratory Chemistry  Inflammation Markers No results found for: ESRSEDRATE, CRP  Renal Function Lab Results  Component Value Date   BUN 23 02/01/2016   CREATININE 1.10 02/01/2016   GFRAA >60 10/08/2014   GFRNONAA >60 10/08/2014    Hepatic Function Lab Results  Component Value Date   AST 21 02/01/2016   ALT 5 02/01/2016   ALBUMIN 4.6 02/01/2016    Electrolytes Lab Results  Component Value Date   NA 134* 02/14/2016   K 4.8 02/01/2016   CL 95* 02/01/2016   CALCIUM 9.9 02/01/2016   MG 1.8 04/21/2013    Pain Modulating Vitamins Lab Results  Component Value Date   VITAMINB12 881 02/01/2016    Coagulation Parameters No results found for: INR, LABPROT  Note: I personally reviewed the above data. Results shared with patient.  Meds  The patient has a current medication list which includes the following prescription(s): aspirin, calcium carbonate, carbidopa-levodopa, citalopram, cyclobenzaprine, gabapentin,  hydrocodone-acetaminophen, hydrocodone-acetaminophen, hydrocodone-acetaminophen, letrozole, magnesium oxide, meloxicam, multivitamin, pravastatin, trazodone, amlodipine, hydrochlorothiazide, losartan, metoprolol succinate, and omeprazole.  Current Outpatient Prescriptions on File Prior to Visit  Medication Sig  . aspirin 81 MG EC tablet Take 81 mg by mouth daily as needed.    . Calcium Carbonate (CALCIUM 600) 1500 MG TABS Take 1 tablet by mouth daily.    . carbidopa-levodopa (SINEMET IR) 25-100 MG per tablet Take 1 tablet by mouth 3 (three) times daily.  . citalopram (CELEXA) 20 MG tablet Take 20 mg by mouth daily.  Marland Kitchen letrozole (FEMARA) 2.5 MG tablet Take 1 tablet (2.5 mg total) by mouth daily.  . Multiple Vitamin (MULTIVITAMIN) tablet Take 1 tablet by mouth daily.  . traZODone (DESYREL) 50 MG tablet TAKE ONE TABLET BY MOUTH AT BEDTIME. (Patient taking differently: TAKE ONE TABLET BY MOUTH AT BEDTIME. PRN)   No current facility-administered medications on file prior to visit.    ROS  Constitutional: Afebrile, no chills, well hydrated and well nourished Gastrointestinal: No upper or lower GI bleeding, no nausea, no vomiting and no acute GI distress Musculoskeletal: No acute joint swelling or redness, no acute loss of range of motion and no acute onset weakness Neurological: Denies any acute onset apraxia, no episodes of paralysis, no acute loss of coordination, no acute loss of consciousness and no acute onset aphasia, dysarthria, agnosia, or amnesia  Allergies  Ms. Verrico is allergic to vesicare.  [place]  Medical:  Ms. Coltrin  has a past medical history of GERD (gastroesophageal reflux disease); Hiatal hernia; HTN (hypertension); Chronic cystitis; Urinary incontinence; Arthritis; Anemia; Breast cancer ([place]); Breast cancer ([place]); Parkinson disease ([place]); LBP (low back pain) (03/26/2014); Back pain (11/01/2013); Arm pain (07/26/2015); Neck pain (11/01/2013); Enthesopathy of hip (03/26/2014);  Degeneration of intervertebral disc of lumbosacral region (03/26/2014); and Arthritis, degenerative (03/26/2014). Family: family history includes Colon polyps in her father; Diabetes in her father; Parkinson's disease in her mother; Stroke in her father  and mother. Surgical:  has past surgical history that includes Breast biopsy (2013); Tonsillectomy and adenoidectomy (79); Vesicovaginal fistula closure w/ TAH (1983); Breast surgery (1986); Breast enhancement surgery (1987); Breast implant removal; Breast implant removal (Right, 08/29/2012); Mastectomy (08/2012); and Abdominal hysterectomy. Tobacco:  reports that she has never smoked. She has never used smokeless tobacco. Alcohol:  reports that she does not drink alcohol. Drug:  reports that she does not use illicit drugs.  Physical Examination  Constitutional Vitals:  Today's Vitals   03/27/16 1410 03/27/16 1412  BP: 121/69   Pulse: 68   Temp: 98.5 F (36.9 C)   TempSrc: Oral   Resp: 16   Height: 5\' 4"  (1.626 m)   Weight: 180 lb (81.647 kg)   SpO2: 98%   PainSc: 4  4   PainLoc: Back    Calculated BMI: Body mass index is 30.88 kg/(m^2). Obese (Class I) (30-34.9 kg/m2) - 68% higher incidence of chronic pain General appearance: alert, cooperative, appears stated age, no distress and mildly obese Eyes: PERLA Respiratory: No evidence respiratory distress, no audible rales or ronchi and no use of accessory muscles of respiration  Cervical Spine Exam  Inspection: Normal anatomy, no anomalies observed Cervical Lordosis: Normal Alignment: Symetrical Functional ROM: Within functional limits (WFL) AROM: WFL Sensory: No sensory abnormalities reported  Upper Extremity Exam    Right  Left  Inspection: No gross anomalies detected  Inspection: No gross anomalies detected  Functional ROM: Adequate  Functional ROM: Adequate  AROM: Adequate  AROM: Adequate  Sensory: Normal  No sensory abnormalities reported  Sensory: Normal  No sensory  abnormalities reported  Motor: Unremarkable  Motor: Unremarkable  Vascular: Normal skin color, temperature, and hair growth. No peripheral edema or cyanosis  Vascular: Normal skin color, temperature, and hair growth. No peripheral edema or cyanosis   Thoracic Spine  Inspection: No gross anomalies detected Alignment: Symetrical Functional ROM: Within functional limits [facility]) AROM: Adequate Palpation: WNL  Lumbar Spine  Inspection: No gross anomalies detected Alignment: Symetrical Functional ROM: Within functional limits [facility]) AROM: Decreased Palpation: Tender Provocative Tests: Lumbar Hyperextension and rotation test: Positive bilaterally for pain coming from the lumbar facet joints. Patrick's Maneuver: deferred  Gait Assessment  Gait: Antalgic (limping)  Lower Extremities    Right  Left  Inspection: No gross anomalies detected  Inspection: No gross anomalies detected  Functional ROM: Within functional limits [facility])  Functional ROM: Within functional limits (WFL)  AROM: Adequate  AROM: Adequate  Sensory: Normal  Sensory: Normal  Motor: Unremarkable  Motor: Unremarkable  Toe walk (S1): WNL  Toe walk (S1): WNL  Heal walk (L5): WNL  Heal walk (L5): WNL   Assessment & Plan  Primary Diagnosis & Pertinent Problem List: The primary encounter diagnosis was Chronic pain. Diagnoses of Encounter for therapeutic drug level monitoring, Long term current use of opiate analgesic, Chronic low back pain, Diffuse myofascial pain syndrome, Neurogenic pain, Primary osteoarthritis involving multiple joints, Lumbar spondylosis, unspecified spinal osteoarthritis, and Facet syndrome, lumbar were also pertinent to this visit.  Visit Diagnosis: 1. Chronic pain   2. Encounter for therapeutic drug level monitoring   3. Long term current use of opiate analgesic   4. Chronic low back pain   5. Diffuse myofascial pain syndrome   6. Neurogenic pain   7. Primary osteoarthritis involving multiple joints    8. Lumbar spondylosis, unspecified spinal osteoarthritis   9. Facet syndrome, lumbar     Problem-specific Plan(s): No problem-specific assessment & plan notes found for  this encounter.   Plan of Care   Problem List Items Addressed This Visit      High   Chronic low back pain (Chronic)   Relevant Medications   HYDROcodone-acetaminophen (NORCO/VICODIN) 5-325 MG tablet   HYDROcodone-acetaminophen (NORCO/VICODIN) 5-325 MG tablet   HYDROcodone-acetaminophen (NORCO/VICODIN) 5-325 MG tablet   cyclobenzaprine (FLEXERIL) 10 MG tablet   meloxicam (MOBIC) 7.5 MG tablet   Chronic pain - Primary (Chronic)   Relevant Medications   HYDROcodone-acetaminophen (NORCO/VICODIN) 5-325 MG tablet   HYDROcodone-acetaminophen (NORCO/VICODIN) 5-325 MG tablet   HYDROcodone-acetaminophen (NORCO/VICODIN) 5-325 MG tablet   magnesium oxide (MAGNESIUM-OXIDE) 400 (241.3 Mg) MG tablet   cyclobenzaprine (FLEXERIL) 10 MG tablet   gabapentin (NEURONTIN) 300 MG capsule   meloxicam (MOBIC) 7.5 MG tablet   Diffuse myofascial pain syndrome (Chronic)   Relevant Medications   cyclobenzaprine (FLEXERIL) 10 MG tablet   meloxicam (MOBIC) 7.5 MG tablet   Facet syndrome, lumbar (Chronic)   Relevant Medications   HYDROcodone-acetaminophen (NORCO/VICODIN) 5-325 MG tablet   HYDROcodone-acetaminophen (NORCO/VICODIN) 5-325 MG tablet   HYDROcodone-acetaminophen (NORCO/VICODIN) 5-325 MG tablet   cyclobenzaprine (FLEXERIL) 10 MG tablet   meloxicam (MOBIC) 7.5 MG tablet   Other Relevant Orders   Radiofrequency,Lumbar   Lumbar spondylosis (Chronic)   Relevant Medications   HYDROcodone-acetaminophen (NORCO/VICODIN) 5-325 MG tablet   HYDROcodone-acetaminophen (NORCO/VICODIN) 5-325 MG tablet   HYDROcodone-acetaminophen (NORCO/VICODIN) 5-325 MG tablet   cyclobenzaprine (FLEXERIL) 10 MG tablet   meloxicam (MOBIC) 7.5 MG tablet   Other Relevant Orders   Radiofrequency,Lumbar   Neurogenic pain (Chronic)   Relevant Medications    gabapentin (NEURONTIN) 300 MG capsule   Osteoarthritis (Chronic)   Relevant Medications   HYDROcodone-acetaminophen (NORCO/VICODIN) 5-325 MG tablet   HYDROcodone-acetaminophen (NORCO/VICODIN) 5-325 MG tablet   HYDROcodone-acetaminophen (NORCO/VICODIN) 5-325 MG tablet   cyclobenzaprine (FLEXERIL) 10 MG tablet   meloxicam (MOBIC) 7.5 MG tablet   Other Relevant Orders   Radiofrequency,Lumbar     Medium   Encounter for therapeutic drug level monitoring   Long term current use of opiate analgesic (Chronic)   Relevant Orders   ToxASSURE Select 13 (MW), Urine       Pharmacotherapy (Medications Ordered): Meds ordered this encounter  Medications  . HYDROcodone-acetaminophen (NORCO/VICODIN) 5-325 MG tablet    Sig: Take 1 tablet by mouth every 8 (eight) hours as needed for moderate pain or severe pain.    Dispense:  90 tablet    Refill:  0    Do not place this medication, or any other prescription from our practice, on "Automatic Refill". Patient may have prescription filled one day early if pharmacy is closed on scheduled refill date. Do not fill until: 03/27/16 To last until: 04/26/16  . HYDROcodone-acetaminophen (NORCO/VICODIN) 5-325 MG tablet    Sig: Take 1 tablet by mouth every 8 (eight) hours as needed for moderate pain or severe pain.    Dispense:  90 tablet    Refill:  0    Do not place this medication, or any other prescription from our practice, on "Automatic Refill". Patient may have prescription filled one day early if pharmacy is closed on scheduled refill date. Do not fill until: 04/26/16 To last until: 05/26/16  . HYDROcodone-acetaminophen (NORCO/VICODIN) 5-325 MG tablet    Sig: Take 1 tablet by mouth every 8 (eight) hours as needed for moderate pain or severe pain.    Dispense:  90 tablet    Refill:  0    Do not place this medication, or any other prescription from our  practice, on "Automatic Refill". Patient may have prescription filled one day early if pharmacy is  closed on scheduled refill date. Do not fill until: 05/26/16 To last until: 06/25/16  . magnesium oxide (MAGNESIUM-OXIDE) 400 (241.3 Mg) MG tablet    Sig: Take 1 tablet (400 mg total) by mouth daily.    Dispense:  30 tablet    Refill:  2    Do not place this medication, or any other prescription from our practice, on "Automatic Refill". Patient may have prescription filled one day early if pharmacy is closed on scheduled refill date.  . cyclobenzaprine (FLEXERIL) 10 MG tablet    Sig: Take 1 tablet (10 mg total) by mouth at bedtime.    Dispense:  30 tablet    Refill:  2    Do not place this medication, or any other prescription from our practice, on "Automatic Refill". Patient may have prescription filled one day early if pharmacy is closed on scheduled refill date.  . gabapentin (NEURONTIN) 300 MG capsule    Sig: Take 1 capsule (300 mg total) by mouth 2 (two) times daily.    Dispense:  60 capsule    Refill:  2    Do not place this medication, or any other prescription from our practice, on "Automatic Refill". Patient may have prescription filled one day early if pharmacy is closed on scheduled refill date.  . meloxicam (MOBIC) 7.5 MG tablet    Sig: Take 1 tablet (7.5 mg total) by mouth daily.    Dispense:  30 tablet    Refill:  2    Do not place this medication, or any other prescription from our practice, on "Automatic Refill". Patient may have prescription filled one day early if pharmacy is closed on scheduled refill date.   Lab-work & Procedure Ordered: Orders Placed This Encounter  Procedures  . Radiofrequency,Lumbar  . ToxASSURE Select 13 (MW), Urine    Imaging Ordered: None  Interventional Therapies: Scheduled:  Therapeutic right sided lumbar facet radiofrequency ablation under fluoroscopic guidance and IV sedation.    Considering:  Bilateral lumbar facet radiofrequency ablation.    PRN Procedures:  None at this time.    Referral(s) or Consult(s): None at this  time.  New Prescriptions   No medications on file    Medications administered during this visit: Ms. Castrillon had no medications administered during this visit.  Future Appointments Date Time Provider [place]  05/04/2016 11:00 AM Noreene Filbert, MD [facility] None  05/31/2016 11:00 AM Einar Pheasant, MD [facility] None  06/21/2016 1:00 PM [facility]  06/26/2016 11:00 PM Milinda Pointer, MD [facility] None  09/26/2016 11:30 AM Lloyd Huger, MD [facility] None    Primary Care Physician: Einar Pheasant, MD Location: [facility] Note by: Kathlen Brunswick. Dossie Arbour, M.D, DABA, DABAPM, DABPM, DABIPP, FIPP  Pain Score Disclaimer: We use the NRS-11 scale. This is a self-reported, subjective measurement of pain severity with only modest accuracy. It is used primarily to identify changes within a particular patient. It must be understood that outpatient pain scales are significantly less accurate that those used for research, where they can be applied under ideal controlled circumstances with minimal exposure to variables. In reality, the score is likely to be a combination of pain intensity and pain affect, where pain affect describes the degree of emotional arousal or changes in action readiness caused by the sensory experience of pain. Factors such as social and work situation, setting, emotional state, anxiety  levels, expectation, and prior pain experience may influence pain perception and show large inter-individual differences that may also be affected by time variables.

## 2016-03-27 NOTE — Progress Notes (Signed)
Pill count: Hydrocodone 84/90; filled 03/25/16 (out of town and couldn't get filled til late)

## 2016-03-27 NOTE — Telephone Encounter (Signed)
I have completed her request. Thanks

## 2016-03-27 NOTE — Patient Instructions (Addendum)
Pain Management Discharge Instructions  General Discharge Instructions :  If you need to reach your doctor call: Monday-Friday 8:00 am - 4:00 pm at [phone] or toll free [phone].  After clinic hours [phone] to have operator reach doctor.  Bring all of your medication bottles to all your appointments in the pain clinic.  To cancel or reschedule your appointment with Pain Management please remember to call 24 hours in advance to avoid a fee.  Refer to the educational materials which you have been given on: General Risks, I had my Procedure. Discharge Instructions, Post Sedation.  Post Procedure Instructions:  The drugs you were given will stay in your system until tomorrow, so for the next 24 hours you should not drive, make any legal decisions or drink any alcoholic beverages.  You may eat anything you prefer, but it is better to start with liquids then soups and crackers, and gradually work up to solid foods.  Please notify your doctor immediately if you have any unusual bleeding, trouble breathing or pain that is not related to your normal pain.  Depending on the type of procedure that was done, some parts of your body may feel week and/or numb.  This usually clears up by tonight or the next day.  Walk with the use of an assistive device or accompanied by an adult for the 24 hours.  You may use ice on the affected area for the first 24 hours.  Put ice in a Ziploc bag and cover with a towel and place against area 15 minutes on 15 minutes off.  You may switch to heat after 24 hours.GENERAL RISKS AND COMPLICATIONS  What are the risk, side effects and possible complications? Generally speaking, most procedures are safe.  However, with any procedure there are risks, side effects, and the possibility of complications.  The risks and complications are dependent upon the sites that are lesioned, or the type of nerve block to be performed.  The closer the procedure is to the spine,  the more serious the risks are.  Great care is taken when placing the radio frequency needles, block needles or lesioning probes, but sometimes complications can occur. 1. Infection: Any time there is an injection through the skin, there is a risk of infection.  This is why sterile conditions are used for these blocks.  There are four possible types of infection. 1. Localized skin infection. 2. Central Nervous System Infection-This can be in the form of Meningitis, which can be deadly. 3. Epidural Infections-This can be in the form of an epidural abscess, which can cause pressure inside of the spine, causing compression of the spinal cord with subsequent paralysis. This would require an emergency surgery to decompress, and there are no guarantees that the patient would recover from the paralysis. 4. Discitis-This is an infection of the intervertebral discs.  It occurs in about 1% of discography procedures.  It is difficult to treat and it may lead to surgery.        2. Pain: the needles have to go through skin and soft tissues, will cause soreness.       3. Damage to internal structures:  The nerves to be lesioned may be near blood vessels or    other nerves which can be potentially damaged.       4. Bleeding: Bleeding is more common if the patient is taking blood thinners such as  aspirin, Coumadin, Ticiid, Plavix, etc., or if he/she have some genetic predisposition  such as   hemophilia. Bleeding into the spinal canal can cause compression of the spinal  cord with subsequent paralysis.  This would require an emergency surgery to  decompress and there are no guarantees that the patient would recover from the  paralysis.       5. Pneumothorax:  Puncturing of a lung is a possibility, every time a needle is introduced in  the area of the chest or upper back.  Pneumothorax refers to free air around the  collapsed lung(s), inside of the thoracic cavity (chest cavity).  Another two possible  complications  related to a similar event would include: Hemothorax and Chylothorax.   These are variations of the Pneumothorax, where instead of air around the collapsed  lung(s), you may have blood or chyle, respectively.       6. Spinal headaches: They may occur with any procedures in the area of the spine.       7. Persistent CSF (Cerebro-Spinal Fluid) leakage: This is a rare problem, but may occur  with prolonged intrathecal or epidural catheters either due to the formation of a fistulous  track or a dural tear.       8. Nerve damage: By working so close to the spinal cord, there is always a possibility of  nerve damage, which could be as serious as a permanent spinal cord injury with  paralysis.       9. Death:  Although rare, severe deadly allergic reactions known as "Anaphylactic  reaction" can occur to any of the medications used.      10. Worsening of the symptoms:  We can always make thing worse.  What are the chances of something like this happening? Chances of any of this occuring are extremely low.  By statistics, you have more of a chance of getting killed in a motor vehicle accident: while driving to the hospital than any of the above occurring .  Nevertheless, you should be aware that they are possibilities.  In general, it is similar to taking a shower.  Everybody knows that you can slip, hit your head and get killed.  Does that mean that you should not shower again?  Nevertheless always keep in mind that statistics do not mean anything if you happen to be on the wrong side of them.  Even if a procedure has a 1 (one) in a 1,000,000 (million) chance of going wrong, it you happen to be that one..Also, keep in mind that by statistics, you have more of a chance of having something go wrong when taking medications.  Who should not have this procedure? If you are on a blood thinning medication (e.g. Coumadin, Plavix, see list of "Blood Thinners"), or if you have an active infection going on, you should not  have the procedure.  If you are taking any blood thinners, please inform your physician.  How should I prepare for this procedure?  Do not eat or drink anything at least six hours prior to the procedure.  Bring a driver with you .  It cannot be a taxi.  Come accompanied by an adult that can drive you back, and that is strong enough to help you if your legs get weak or numb from the local anesthetic.  Take all of your medicines the morning of the procedure with just enough water to swallow them.  If you have diabetes, make sure that you are scheduled to have your procedure done first thing in the morning, whenever possible.  If you have diabetes,   take only half of your insulin dose and notify our nurse that you have done so as soon as you arrive at the clinic.  If you are diabetic, but only take blood sugar pills (oral hypoglycemic), then do not take them on the morning of your procedure.  You may take them after you have had the procedure.  Do not take aspirin or any aspirin-containing medications, at least eleven (11) days prior to the procedure.  They may prolong bleeding.  Wear loose fitting clothing that may be easy to take off and that you would not mind if it got stained with Betadine or blood.  Do not wear any jewelry or perfume  Remove any nail coloring.  It will interfere with some of our monitoring equipment.  NOTE: Remember that this is not meant to be interpreted as a complete list of all possible complications.  Unforeseen problems may occur.  BLOOD THINNERS The following drugs contain aspirin or other products, which can cause increased bleeding during surgery and should not be taken for 2 weeks prior to and 1 week after surgery.  If you should need take something for relief of minor pain, you may take acetaminophen which is found in Tylenol,m Datril, Anacin-3 and Panadol. It is not blood thinner. The products listed below are.  Do not take any of the products listed below  in addition to any listed on your instruction sheet.  A.P.C or A.P.C with Codeine Codeine Phosphate Capsules #3 Ibuprofen Ridaura  ABC compound Congesprin Imuran rimadil  Advil Cope Indocin Robaxisal  Alka-Seltzer Effervescent Pain Reliever and Antacid Coricidin or Coricidin-D  Indomethacin Rufen  Alka-Seltzer plus Cold Medicine Cosprin Ketoprofen S-A-C Tablets  Anacin Analgesic Tablets or Capsules Coumadin Korlgesic Salflex  Anacin Extra Strength Analgesic tablets or capsules CP-2 Tablets Lanoril Salicylate  Anaprox Cuprimine Capsules Levenox Salocol  Anexsia-D Dalteparin Magan Salsalate  Anodynos Darvon compound Magnesium Salicylate Sine-off  Ansaid Dasin Capsules Magsal Sodium Salicylate  Anturane Depen Capsules Marnal Soma  APF Arthritis pain formula Dewitt's Pills Measurin Stanback  Argesic Dia-Gesic Meclofenamic Sulfinpyrazone  Arthritis Bayer Timed Release Aspirin Diclofenac Meclomen Sulindac  Arthritis pain formula Anacin Dicumarol Medipren Supac  Analgesic (Safety coated) Arthralgen Diffunasal Mefanamic Suprofen  Arthritis Strength Bufferin Dihydrocodeine Mepro Compound Suprol  Arthropan liquid Dopirydamole Methcarbomol with Aspirin Synalgos  ASA tablets/Enseals Disalcid Micrainin Tagament  Ascriptin Doan's Midol Talwin  Ascriptin A/D Dolene Mobidin Tanderil  Ascriptin Extra Strength Dolobid Moblgesic Ticlid  Ascriptin with Codeine Doloprin or Doloprin with Codeine Momentum Tolectin  Asperbuf Duoprin Mono-gesic Trendar  Aspergum Duradyne Motrin or Motrin IB Triminicin  Aspirin plain, buffered or enteric coated Durasal Myochrisine Trigesic  Aspirin Suppositories Easprin Nalfon Trillsate  Aspirin with Codeine Ecotrin Regular or Extra Strength Naprosyn Uracel  Atromid-S Efficin Naproxen Ursinus  Auranofin Capsules Elmiron Neocylate Vanquish  Axotal Emagrin Norgesic Verin  Azathioprine Empirin or Empirin with Codeine Normiflo Vitamin E  Azolid Emprazil Nuprin Voltaren  Bayer  Aspirin plain, buffered or children's or timed BC Tablets or powders Encaprin Orgaran Warfarin Sodium  Buff-a-Comp Enoxaparin Orudis Zorpin  Buff-a-Comp with Codeine Equegesic Os-Cal-Gesic   Buffaprin Excedrin plain, buffered or Extra Strength Oxalid   Bufferin Arthritis Strength Feldene Oxphenbutazone   Bufferin plain or Extra Strength Feldene Capsules Oxycodone with Aspirin   Bufferin with Codeine Fenoprofen Fenoprofen Pabalate or Pabalate-SF   Buffets II Flogesic Panagesic   Buffinol plain or Extra Strength Florinal or Florinal with Codeine Panwarfarin   Buf-Tabs Flurbiprofen Penicillamine   Butalbital Compound Four-way cold tablets   Penicillin   Butazolidin Fragmin Pepto-Bismol   Carbenicillin Geminisyn Percodan   Carna Arthritis Reliever Geopen Persantine   Carprofen Gold's salt Persistin   Chloramphenicol Goody's Phenylbutazone   Chloromycetin Haltrain Piroxlcam   Clmetidine heparin Plaquenil   Cllnoril Hyco-pap Ponstel   Clofibrate Hydroxy chloroquine Propoxyphen         Before stopping any of these medications, be sure to consult the physician who ordered them.  Some, such as Coumadin (Warfarin) are ordered to prevent or treat serious conditions such as "deep thrombosis", "pumonary embolisms", and other heart problems.  The amount of time that you may need off of the medication may also vary with the medication and the reason for which you were taking it.  If you are taking any of these medications, please make sure you notify your pain physician before you undergo any procedures.         Facet Joint Block The facet joints connect the bones of the spine (vertebrae). They make it possible for you to bend, twist, and make other movements with your spine. They also prevent you from overbending, overtwisting, and making other excessive movements.  A facet joint block is a procedure where a numbing medicine (anesthetic) is injected into a facet joint. Often, a type of  anti-inflammatory medicine called a steroid is also injected. A facet joint block may be done for two reasons:  2. Diagnosis. A facet joint block may be done as a test to see whether neck or back pain is caused by a worn-down or infected facet joint. If the pain gets better after a facet joint block, it means the pain is probably coming from the facet joint. If the pain does not get better, it means the pain is probably not coming from the facet joint.  3. Therapy. A facet joint block may be done to relieve neck or back pain caused by a facet joint. A facet joint block is only done as a therapy if the pain does not improve with medicine, exercise programs, physical therapy, and other forms of pain management. LET YOUR HEALTH CARE PROVIDER KNOW ABOUT:   Any allergies you have.   All medicines you are taking, including vitamins, herbs, eyedrops, and over-the-counter medicines and creams.   Previous problems you or members of your family have had with the use of anesthetics.   Any blood disorders you have had.   Other health problems you have. RISKS AND COMPLICATIONS Generally, having a facet joint block is safe. However, as with any procedure, complications can occur. Possible complications associated with having a facet joint block include:   Bleeding.   Injury to a nerve near the injection site.   Pain at the injection site.   Weakness or numbness in areas controlled by nerves near the injection site.   Infection.   Temporary fluid retention.   Allergic reaction to anesthetics or medicines used during the procedure. BEFORE THE PROCEDURE   Follow your health care provider's instructions if you are taking dietary supplements or medicines. You may need to stop taking them or reduce your dosage.   Do not take any new dietary supplements or medicines without asking your health care provider first.   Follow your health care provider's instructions about eating and drinking  before the procedure. You may need to stop eating and drinking several hours before the procedure.   Arrange to have an adult drive you home after the procedure. PROCEDURE 12. You may need to remove your clothing and   dress in an open-back gown so that your health care provider can access your spine.  13. The procedure will be done while you are lying on an X-ray table. Most of the time you will be asked to lie on your stomach, but you may be asked to lie in a different position if an injection will be made in your neck.  14. Special machines will be used to monitor your oxygen levels, heart rate, and blood pressure.  15. If an injection will be made in your neck, an intravenous (IV) tube will be inserted into one of your veins. Fluids and medicine will flow directly into your body through the IV tube.  16. The area over the facet joint where the injection will be made will be cleaned with an antiseptic soap. The surrounding skin will be covered with sterile drapes.  17. An anesthetic will be applied to your skin to make the injection area numb. You may feel a temporary stinging or burning sensation.  18. A video X-ray machine will be used to locate the joint. A contrast dye may be injected into the facet joint area to help with locating the joint.  19. When the joint is located, an anesthetic medicine will be injected into the joint through the needle.  60. Your health care provider will ask you whether you feel pain relief. If you do feel relief, a steroid may be injected to provide pain relief for a longer period of time. If you do not feel relief or feel only partial relief, additional injections of an anesthetic may be made in other facet joints.  21. The needle will be removed, the skin will be cleansed, and bandages will be applied.  AFTER THE PROCEDURE   You will be observed for 15-30 minutes before being allowed to go home. Do not drive. Have an adult drive you or take a taxi or  public transportation instead.   If you feel pain relief, the pain will return in several hours or days when the anesthetic wears off.   You may feel pain relief 2-14 days after the procedure. The amount of time this relief lasts varies from person to person.   It is normal to feel some tenderness over the injected area(s) for 2 days following the procedure.   If you have diabetes, you may have a temporary increase in blood sugar.   This information is not intended to replace advice given to you by your health care provider. Make sure you discuss any questions you have with your health care provider.   Document Released: 04/25/2007 Document Revised: 12/25/2014 Document Reviewed: 09/23/2012 Elsevier Interactive Patient Education 2016 [place]. Radiofrequency Lesioning Radiofrequency lesioning is a procedure that is performed to relieve pain. The procedure is often used for back, neck, or arm pain. Radiofrequency lesioning involves the use of a machine that creates radio waves to make heat. During the procedure, the heat is applied to the nerve that carries the pain signal. The heat damages the nerve and interferes with the pain signal. Pain relief usually lasts for 6 months to 1 year. LET [facility] CARE PROVIDER KNOW ABOUT: 4. Any allergies you have. 5. All medicines you are taking, including vitamins, herbs, eye drops, creams, and over-the-counter medicines. 6. Previous problems you or members of your family have had with the use of anesthetics. 7. Any blood disorders you have. 8. Previous surgeries you have had. 9. Any medical conditions you have. 10. Whether you are pregnant or  may be pregnant. RISKS AND COMPLICATIONS Generally, this is a safe procedure. However, problems may occur, including:  Pain or soreness at the injection site.  Infection at the injection site.  Damage to nerves or blood vessels. BEFORE THE PROCEDURE  Ask your health care provider  about:  Changing or stopping your regular medicines. This is especially important if you are taking diabetes medicines or blood thinners.  Taking medicines such as aspirin and ibuprofen. These medicines can thin your blood. Do not take these medicines before your procedure if your health care provider instructs you not to.  Follow instructions from your health care provider about eating or drinking restrictions.  Plan to have someone take you home after the procedure.  If you go home right after the procedure, plan to have someone with you for 24 hours. PROCEDURE  You will be given one or more of the following:  A medicine to help you relax (sedative).  A medicine to numb the area (local anesthetic).  You will be awake during the procedure. You will need to be able to talk with the health care provider during the procedure.  With the help of a type of X-ray (fluoroscopy), the health care provider will insert a radiofrequency needle into the area to be treated.  Next, a wire that carries the radio waves (electrode) will be put through the radiofrequency needle. An electrical pulse will be sent through the electrode to verify the correct nerve. You will feel a tingling sensation, and you may have muscle twitching.  Then, the tissue that is around the needle tip will be heated by an electric current that is passed using the radiofrequency machine. This will numb the nerves.  A bandage (dressing) will be put on the insertion area after the procedure is done. The procedure may vary among health care providers and hospitals. AFTER THE PROCEDURE 22. Your blood pressure, heart rate, breathing rate, and blood oxygen level will be monitored often until the medicines you were given have worn off. 23. Return to your normal activities as directed by your health care provider.   This information is not intended to replace advice given to you by your health care provider. Make sure you discuss any  questions you have with your health care provider.   Document Released: 08/02/2011 Document Revised: 08/25/2015 Document Reviewed: 01/11/2015 Elsevier Interactive Patient Education [employer].

## 2016-03-27 NOTE — Telephone Encounter (Signed)
Pt lvm stating that she was told by Dr. Nicki Reaper that she needed to start doubling her cholesterol medication. She did and now she has ran out of her cholesterol medication. She states that she needs a refill. She did not leave the name of the medication or the pharmacy that she uses.

## 2016-03-28 ENCOUNTER — Inpatient Hospital Stay: Payer: Medicare Other | Attending: Oncology | Admitting: Oncology

## 2016-03-28 VITALS — BP 117/76 | HR 75 | Temp 98.7°F | Resp 18 | Wt 187.6 lb

## 2016-03-28 DIAGNOSIS — Z7982 Long term (current) use of aspirin: Secondary | ICD-10-CM | POA: Diagnosis not present

## 2016-03-28 DIAGNOSIS — I10 Essential (primary) hypertension: Secondary | ICD-10-CM | POA: Diagnosis not present

## 2016-03-28 DIAGNOSIS — R32 Unspecified urinary incontinence: Secondary | ICD-10-CM | POA: Diagnosis not present

## 2016-03-28 DIAGNOSIS — N3020 Other chronic cystitis without hematuria: Secondary | ICD-10-CM

## 2016-03-28 DIAGNOSIS — M129 Arthropathy, unspecified: Secondary | ICD-10-CM | POA: Diagnosis not present

## 2016-03-28 DIAGNOSIS — M25511 Pain in right shoulder: Secondary | ICD-10-CM

## 2016-03-28 DIAGNOSIS — Z853 Personal history of malignant neoplasm of breast: Secondary | ICD-10-CM | POA: Insufficient documentation

## 2016-03-28 DIAGNOSIS — G20 Parkinson's disease: Secondary | ICD-10-CM | POA: Insufficient documentation

## 2016-03-28 DIAGNOSIS — M8580 Other specified disorders of bone density and structure, unspecified site: Secondary | ICD-10-CM | POA: Insufficient documentation

## 2016-03-28 DIAGNOSIS — C170 Malignant neoplasm of duodenum: Secondary | ICD-10-CM | POA: Diagnosis not present

## 2016-03-28 DIAGNOSIS — M549 Dorsalgia, unspecified: Secondary | ICD-10-CM | POA: Diagnosis not present

## 2016-03-28 DIAGNOSIS — C50911 Malignant neoplasm of unspecified site of right female breast: Secondary | ICD-10-CM | POA: Insufficient documentation

## 2016-03-28 DIAGNOSIS — Z79811 Long term (current) use of aromatase inhibitors: Secondary | ICD-10-CM

## 2016-03-28 DIAGNOSIS — Z170 Estrogen receptor positive status [ER+]: Secondary | ICD-10-CM | POA: Insufficient documentation

## 2016-03-28 DIAGNOSIS — K219 Gastro-esophageal reflux disease without esophagitis: Secondary | ICD-10-CM | POA: Diagnosis not present

## 2016-03-28 DIAGNOSIS — M5137 Other intervertebral disc degeneration, lumbosacral region: Secondary | ICD-10-CM | POA: Diagnosis not present

## 2016-03-28 DIAGNOSIS — K449 Diaphragmatic hernia without obstruction or gangrene: Secondary | ICD-10-CM | POA: Diagnosis not present

## 2016-03-28 DIAGNOSIS — Z79899 Other long term (current) drug therapy: Secondary | ICD-10-CM | POA: Diagnosis not present

## 2016-03-28 NOTE — Progress Notes (Signed)
[place]  [phone] [phone]  ID: [name] OB: [birth year]  MR#: [id number]  MBW#:[id number]  Patient Care Team: Einar Pheasant, MD as PCP - General (Internal Medicine)  CHIEF COMPLAINT:  Chief Complaint  Patient presents with  . Breast Cancer    INTERVAL HISTORY: Patient returns to clinic today for routine 6 month followup.  Her Parkinson's symptoms continue to be controlled with her current medication regimen. She is tolerating letrozole without significant side effects. She has had some right shoulder pain but has had injections that have helped. She has no other neurologic complaints.  She denies any recent fevers.  She has a good appetite and denies weight loss.  She denies any chest pain or shortness of breath.  She denies any nausea, vomiting, constipation, or diarrhea.  She has no urinary complaints.  Patient offers no further specific complaints today.   REVIEW OF SYSTEMS:   Review of Systems  Constitutional: Negative.   Respiratory: Negative.   Cardiovascular: Negative.   Gastrointestinal: Negative.   Musculoskeletal: Negative.   Neurological: Positive for tremors.    As per HPI. Otherwise, a complete review of systems is negatve.  PAST MEDICAL HISTORY: Past Medical History  Diagnosis Date  . GERD (gastroesophageal reflux disease)   . Hiatal hernia   . HTN (hypertension)   . Chronic cystitis   . Urinary incontinence     mixed   . Arthritis   . Anemia   . Breast cancer ([place])     Masectomy - left - 1986   . Breast cancer [facility])     Mastectomy-right -2014  . Parkinson disease ([place])   . LBP (low back pain) 03/26/2014  . Back pain 11/01/2013  . Arm pain 07/26/2015  . Neck pain 11/01/2013  . Enthesopathy of hip 03/26/2014  . Degeneration of intervertebral disc of lumbosacral region 03/26/2014  . Arthritis, degenerative 03/26/2014    PAST SURGICAL HISTORY: Past Surgical History  Procedure Laterality Date  .  Breast biopsy  2013  . Tonsillectomy and adenoidectomy  79  . Vesicovaginal fistula closure w/ tah  1983  . Breast surgery  1986    s/p left mastectomy  . Breast enhancement surgery  1987  . Breast implant removal    . Breast implant removal Right 08/29/2012  . Mastectomy  08/2012    right  . Abdominal hysterectomy      FAMILY HISTORY Family History  Problem Relation Age of Onset  . Diabetes Father   . Stroke Father   . Colon polyps Father   . Stroke Mother   . Parkinson's disease Mother        ADVANCED DIRECTIVES:    HEALTH MAINTENANCE: Social History  Substance Use Topics  . Smoking status: Never Smoker   . Smokeless tobacco: Never Used     Comment: tobacco use - no  . Alcohol Use: No     Allergies  Allergen Reactions  . Vesicare [Solifenacin] Other (See Comments)    Constipation Constipation    Current Outpatient Prescriptions  Medication Sig Dispense Refill  . amLODipine (NORVASC) 2.5 MG tablet TAKE 1 TABLET BY MOUTH ONCE DAILY. 30 tablet 11  . aspirin 81 MG EC tablet Take 81 mg by mouth daily as needed.      . Calcium Carbonate (CALCIUM 600) 1500 MG TABS Take 1 tablet by mouth daily.      . carbidopa-levodopa (SINEMET IR) 25-100 MG per tablet Take 1 tablet by mouth 3 (three) times  daily.    . citalopram (CELEXA) 20 MG tablet Take 20 mg by mouth daily.    . cyclobenzaprine (FLEXERIL) 10 MG tablet Take 1 tablet (10 mg total) by mouth at bedtime. 30 tablet 2  . gabapentin (NEURONTIN) 300 MG capsule Take 1 capsule (300 mg total) by mouth 2 (two) times daily. 60 capsule 2  . hydrochlorothiazide (HYDRODIURIL) 25 MG tablet TAKE 1 TABLET BY MOUTH ONCE DAILY. 30 tablet 11  . HYDROcodone-acetaminophen (NORCO/VICODIN) 5-325 MG tablet Take 1 tablet by mouth every 8 (eight) hours as needed for moderate pain or severe pain. 90 tablet 0  . HYDROcodone-acetaminophen (NORCO/VICODIN) 5-325 MG tablet Take 1 tablet by mouth every 8 (eight) hours as needed for moderate pain or  severe pain. 90 tablet 0  . HYDROcodone-acetaminophen (NORCO/VICODIN) 5-325 MG tablet Take 1 tablet by mouth every 8 (eight) hours as needed for moderate pain or severe pain. 90 tablet 0  . letrozole (FEMARA) 2.5 MG tablet Take 1 tablet (2.5 mg total) by mouth daily. 90 tablet 0  . losartan (COZAAR) 100 MG tablet TAKE 1 TABLET BY MOUTH ONCE DAILY. 30 tablet 11  . magnesium oxide (MAGNESIUM-OXIDE) 400 (241.3 Mg) MG tablet Take 1 tablet (400 mg total) by mouth daily. 30 tablet 2  . meloxicam (MOBIC) 7.5 MG tablet Take 1 tablet (7.5 mg total) by mouth daily. 30 tablet 2  . metoprolol succinate (TOPROL-XL) 25 MG 24 hr tablet TAKE 1 TABLET BY MOUTH TWICE DAILY 60 tablet 11  . Multiple Vitamin (MULTIVITAMIN) tablet Take 1 tablet by mouth daily.    Marland Kitchen omeprazole (PRILOSEC) 20 MG capsule TAKE 1 CAPSULE BY MOUTH TWICE DAILY FOR REFLUX. 60 capsule 11  . pravastatin (PRAVACHOL) 20 MG tablet Take 1 tablet (20 mg total) by mouth daily. 90 tablet 2  . traZODone (DESYREL) 50 MG tablet TAKE ONE TABLET BY MOUTH AT BEDTIME. (Patient taking differently: TAKE ONE TABLET BY MOUTH AT BEDTIME. PRN) 30 tablet 2   No current facility-administered medications for this visit.    OBJECTIVE: Filed Vitals:   03/28/16 1123  BP: 117/76  Pulse: 75  Temp: 98.7 F (37.1 C)  Resp: 18     Body mass index is 32.19 kg/(m^2).    ECOG FS:0 - Asymptomatic  General: Well-developed, well-nourished, no acute distress. Eyes: Pink conjunctiva, anicteric sclera. Breasts: Patient requested exam be deferred today. Lungs: Clear to auscultation bilaterally. Heart: Regular rate and rhythm. No rubs, murmurs, or gallops. Abdomen: Soft, nontender, nondistended. No organomegaly noted, normoactive bowel sounds. Musculoskeletal: No edema, cyanosis, or clubbing. Neuro: Alert, answering all questions appropriately. Cranial nerves grossly intact. Skin: No rashes or petechiae noted. Psych: Normal affect.   LAB RESULTS:  Lab Results    Component Value Date   NA 134* 02/14/2016   K 4.8 02/01/2016   CL 95* 02/01/2016   CO2 32 02/01/2016   GLUCOSE 83 02/01/2016   BUN 23 02/01/2016   CREATININE 1.10 02/01/2016   CALCIUM 9.9 02/01/2016   PROT 7.5 02/01/2016   ALBUMIN 4.6 02/01/2016   AST 21 02/01/2016   ALT 5 02/01/2016   ALKPHOS 67 02/01/2016   BILITOT 0.5 02/01/2016   GFRNONAA >60 10/08/2014   GFRAA >60 10/08/2014    Lab Results  Component Value Date   WBC 7.6 06/15/2015   NEUTROABS 5.0 06/15/2015   HGB 13.0 06/15/2015   HCT 39.5 06/15/2015   MCV 90.1 06/15/2015   PLT 274.0 06/15/2015     STUDIES: No results found.  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  PLAN:    1.  Breast cancer: No evidence of disease. Continue letrozole completing 5 years of therapy in May 2019. Patient has had bilateral mastectomies, therefore no further mammograms are necessary. Patient finished her chemotherapy in January 2014. She did not require XRT given her bilateral mastectomies. Return to clinic in 6 months for routine evaluation. 2. Osteopenia: Patient's most recent bone mineral density on June 15, 2015 was reported as -2.0. This is essentially unchanged from one year prior when it was -2.1. Repeat in June 2017.  3.  Tremors/Parkinson's: Stable. Treatment per neurology.   Patient expressed understanding and was in agreement with this plan. She also understands that She can call clinic at any time with any questions, concerns, or complaints.   Breast cancer [facility])   Staging form: Breast, AJCC 7th Edition     Clinical stage from 09/22/2015: Stage IIA (T1c, N1, M0) - Signed by Lloyd Huger, MD on 09/22/2015   Mayra Reel, NP   03/28/2016 12:07 PM  Patient was seen and evaluated independently and I agree with the assessment and plan as dictated above.  Lloyd Huger, MD 03/28/2016 2:06 PM

## 2016-04-01 LAB — TOXASSURE SELECT 13 (MW), URINE: PDF: 0

## 2016-04-12 DIAGNOSIS — Z961 Presence of intraocular lens: Secondary | ICD-10-CM | POA: Diagnosis not present

## 2016-04-26 ENCOUNTER — Other Ambulatory Visit: Payer: Self-pay | Admitting: *Deleted

## 2016-04-26 MED ORDER — LETROZOLE 2.5 MG PO TABS: 2.5000 mg | ORAL_TABLET | Freq: Every day | ORAL | Status: DC

## 2016-05-02 ENCOUNTER — Encounter: Payer: Self-pay | Admitting: Pain Medicine

## 2016-05-02 ENCOUNTER — Ambulatory Visit: Payer: Medicare Other | Attending: Pain Medicine | Admitting: Pain Medicine

## 2016-05-02 VITALS — BP 162/76 | HR 62 | Temp 97.7°F | Resp 15 | Ht 64.0 in | Wt 180.0 lb

## 2016-05-02 DIAGNOSIS — M25559 Pain in unspecified hip: Secondary | ICD-10-CM | POA: Diagnosis not present

## 2016-05-02 DIAGNOSIS — D649 Anemia, unspecified: Secondary | ICD-10-CM | POA: Insufficient documentation

## 2016-05-02 DIAGNOSIS — L989 Disorder of the skin and subcutaneous tissue, unspecified: Secondary | ICD-10-CM | POA: Insufficient documentation

## 2016-05-02 DIAGNOSIS — M791 Myalgia: Secondary | ICD-10-CM | POA: Diagnosis not present

## 2016-05-02 DIAGNOSIS — E785 Hyperlipidemia, unspecified: Secondary | ICD-10-CM | POA: Diagnosis not present

## 2016-05-02 DIAGNOSIS — R32 Unspecified urinary incontinence: Secondary | ICD-10-CM | POA: Diagnosis not present

## 2016-05-02 DIAGNOSIS — R51 Headache: Secondary | ICD-10-CM | POA: Insufficient documentation

## 2016-05-02 DIAGNOSIS — G8929 Other chronic pain: Secondary | ICD-10-CM | POA: Diagnosis not present

## 2016-05-02 DIAGNOSIS — Z79891 Long term (current) use of opiate analgesic: Secondary | ICD-10-CM | POA: Diagnosis not present

## 2016-05-02 DIAGNOSIS — Z6839 Body mass index (BMI) 39.0-39.9, adult: Secondary | ICD-10-CM | POA: Diagnosis not present

## 2016-05-02 DIAGNOSIS — G20 Parkinson's disease: Secondary | ICD-10-CM | POA: Diagnosis not present

## 2016-05-02 DIAGNOSIS — R079 Chest pain, unspecified: Secondary | ICD-10-CM | POA: Diagnosis not present

## 2016-05-02 DIAGNOSIS — M542 Cervicalgia: Secondary | ICD-10-CM | POA: Insufficient documentation

## 2016-05-02 DIAGNOSIS — Z853 Personal history of malignant neoplasm of breast: Secondary | ICD-10-CM | POA: Diagnosis not present

## 2016-05-02 DIAGNOSIS — M47816 Spondylosis without myelopathy or radiculopathy, lumbar region: Secondary | ICD-10-CM | POA: Insufficient documentation

## 2016-05-02 DIAGNOSIS — N760 Acute vaginitis: Secondary | ICD-10-CM | POA: Insufficient documentation

## 2016-05-02 DIAGNOSIS — M545 Low back pain: Secondary | ICD-10-CM | POA: Insufficient documentation

## 2016-05-02 DIAGNOSIS — N8110 Cystocele, unspecified: Secondary | ICD-10-CM | POA: Insufficient documentation

## 2016-05-02 DIAGNOSIS — E669 Obesity, unspecified: Secondary | ICD-10-CM | POA: Diagnosis not present

## 2016-05-02 DIAGNOSIS — R251 Tremor, unspecified: Secondary | ICD-10-CM | POA: Diagnosis not present

## 2016-05-02 DIAGNOSIS — B351 Tinea unguium: Secondary | ICD-10-CM | POA: Diagnosis not present

## 2016-05-02 DIAGNOSIS — R0600 Dyspnea, unspecified: Secondary | ICD-10-CM | POA: Diagnosis not present

## 2016-05-02 DIAGNOSIS — M79606 Pain in leg, unspecified: Secondary | ICD-10-CM | POA: Diagnosis present

## 2016-05-02 DIAGNOSIS — M546 Pain in thoracic spine: Secondary | ICD-10-CM | POA: Diagnosis not present

## 2016-05-02 DIAGNOSIS — Z87440 Personal history of urinary (tract) infections: Secondary | ICD-10-CM | POA: Insufficient documentation

## 2016-05-02 DIAGNOSIS — E7800 Pure hypercholesterolemia, unspecified: Secondary | ICD-10-CM | POA: Insufficient documentation

## 2016-05-02 DIAGNOSIS — I129 Hypertensive chronic kidney disease with stage 1 through stage 4 chronic kidney disease, or unspecified chronic kidney disease: Secondary | ICD-10-CM | POA: Insufficient documentation

## 2016-05-02 DIAGNOSIS — J329 Chronic sinusitis, unspecified: Secondary | ICD-10-CM | POA: Insufficient documentation

## 2016-05-02 MED ORDER — FENTANYL CITRATE (PF) 100 MCG/2ML IJ SOLN: 25.0000 ug | INTRAMUSCULAR | Status: DC | PRN

## 2016-05-02 MED ORDER — TRIAMCINOLONE ACETONIDE 40 MG/ML IJ SUSP
INTRAMUSCULAR | Status: AC
  Administered 2016-05-02: 13:00:00
  Filled 2016-05-02: qty 1

## 2016-05-02 MED ORDER — MIDAZOLAM HCL 5 MG/5ML IJ SOLN: 1.0000 mg | INTRAMUSCULAR | Status: DC | PRN

## 2016-05-02 MED ORDER — LACTATED RINGERS IV SOLN: 1000.0000 mL | Freq: Once | INTRAVENOUS | Status: DC

## 2016-05-02 MED ORDER — ROPIVACAINE HCL 2 MG/ML IJ SOLN: 9.0000 mL | Freq: Once | INTRAMUSCULAR | Status: DC

## 2016-05-02 MED ORDER — FENTANYL CITRATE (PF) 100 MCG/2ML IJ SOLN
INTRAMUSCULAR | Status: AC
  Administered 2016-05-02: 50 ug via INTRAVENOUS
  Filled 2016-05-02: qty 2

## 2016-05-02 MED ORDER — OXYCODONE HCL 5 MG PO TABS
5.0000 mg | ORAL_TABLET | Freq: Three times a day (TID) | ORAL | Status: DC | PRN
Start: 2016-05-02 — End: 2016-06-02

## 2016-05-02 MED ORDER — ROPIVACAINE HCL 2 MG/ML IJ SOLN
INTRAMUSCULAR | Status: AC
  Administered 2016-05-02: 13:00:00
  Filled 2016-05-02: qty 10

## 2016-05-02 MED ORDER — MIDAZOLAM HCL 5 MG/5ML IJ SOLN
INTRAMUSCULAR | Status: AC
  Administered 2016-05-02: 2 mg via INTRAVENOUS
  Filled 2016-05-02: qty 5

## 2016-05-02 MED ORDER — LIDOCAINE HCL (PF) 1 % IJ SOLN: 10.0000 mL | Freq: Once | INTRAMUSCULAR | Status: DC

## 2016-05-02 MED ORDER — TRIAMCINOLONE ACETONIDE 40 MG/ML IJ SUSP: 40.0000 mg | Freq: Once | INTRAMUSCULAR | Status: DC

## 2016-05-02 NOTE — Assessment & Plan Note (Signed)
Right lumbar facet radiofrequency ablation done on 05/02/2016.

## 2016-05-02 NOTE — Patient Instructions (Addendum)
Oxycodone tablets or capsules What is this medicine? OXYCODONE (ox i KOE done) is a pain reliever. It is used to treat moderate to severe pain. This medicine may be used for other purposes; ask your health care provider or pharmacist if you have questions. What should I tell my health care provider before I take this medicine? They need to know if you have any of these conditions: -Addison's disease -brain tumor -head injury -heart disease -history of drug or alcohol abuse problem -if you often drink alcohol -kidney disease -liver disease -lung or breathing disease, like asthma -mental illness -pancreatic disease -seizures -thyroid disease -an unusual or allergic reaction to oxycodone, codeine, hydrocodone, morphine, other medicines, foods, dyes, or preservatives -pregnant or trying to get pregnant -breast-feeding How should I use this medicine? Take this medicine by mouth with a glass of water. Follow the directions on the prescription label. You can take it with or without food. If it upsets your stomach, take it with food. Take your medicine at regular intervals. Do not take it more often than directed. Do not stop taking except on your doctor's advice. Some brands of this medicine, like Oxecta, have special instructions. Ask your doctor or pharmacist if these directions are for you: Do not cut, crush or chew this medicine. Swallow only one tablet at a time. Do not wet, soak, or lick the tablet before you take it. Talk to your pediatrician regarding the use of this medicine in children. Special care may be needed. Overdosage: If you think you have taken too much of this medicine contact a poison control center or emergency room at once. NOTE: This medicine is only for you. Do not share this medicine with others. What if I miss a dose? If you miss a dose, take it as soon as you can. If it is almost time for your next dose, take only that dose. Do not take double or extra doses. What  may interact with this medicine? -alcohol -antihistamines -certain medicines used for nausea like chlorpromazine, droperidol -erythromycin -ketoconazole -medicines for depression, anxiety, or psychotic disturbances -medicines for sleep -muscle relaxants -naloxone -naltrexone -narcotic medicines (opiates) for pain -nilotinib -phenobarbital -phenytoin -rifampin -ritonavir -voriconazole This list may not describe all possible interactions. Give your health care provider a list of all the medicines, herbs, non-prescription drugs, or dietary supplements you use. Also tell them if you smoke, drink alcohol, or use illegal drugs. Some items may interact with your medicine. What should I watch for while using this medicine? Tell your doctor or health care professional if your pain does not go away, if it gets worse, or if you have new or a different type of pain. You may develop tolerance to the medicine. Tolerance means that you will need a higher dose of the medicine for pain relief. Tolerance is normal and is expected if you take this medicine for a long time. Do not suddenly stop taking your medicine because you may develop a severe reaction. Your body becomes used to the medicine. This does NOT mean you are addicted. Addiction is a behavior related to getting and using a drug for a non-medical reason. If you have pain, you have a medical reason to take pain medicine. Your doctor will tell you how much medicine to take. If your doctor wants you to stop the medicine, the dose will be slowly lowered over time to avoid any side effects. You may get drowsy or dizzy when you first start taking this medicine or change doses.  Do not drive, use machinery, or do anything that may be dangerous until you know how the medicine affects you. Stand or sit up slowly. There are different types of narcotic medicines (opiates) for pain. If you take more than one type at the same time, you may have more side effects.  Give your health care provider a list of all medicines you use. Your doctor will tell you how much medicine to take. Do not take more medicine than directed. Call emergency for help if you have problems breathing. This medicine will cause constipation. Try to have a bowel movement at least every 2 to 3 days. If you do not have a bowel movement for 3 days, call your doctor or health care professional. Your mouth may get dry. Drinking water, chewing sugarless gum, or sucking on hard candy may help. See your dentist every 6 months. What side effects may I notice from receiving this medicine? Side effects that you should report to your doctor or health care professional as soon as possible: -allergic reactions like skin rash, itching or hives, swelling of the face, lips, or tongue -breathing problems -confusion -feeling faint or lightheaded, falls -trouble passing urine or change in the amount of urine -unusually weak or tired Side effects that usually do not require medical attention (report to your doctor or health care professional if they continue or are bothersome): -constipation -dry mouth -itching -nausea, vomiting -upset stomach This list may not describe all possible side effects. Call your doctor for medical advice about side effects. You may report side effects to FDA at 1-800-FDA-1088. Where should I keep my medicine? Keep out of the reach of children. This medicine can be abused. Keep your medicine in a safe place to protect it from theft. Do not share this medicine with anyone. Selling or giving away this medicine is dangerous and against the law. Store at room temperature between 15 and 30 degrees C (59 and 86 degrees F). Protect from light. Keep container tightly closed. This medicine may cause accidental overdose and death if it is taken by other adults, children, or pets. Flush any unused medicine down the toilet to reduce the chance of harm. Do not use the medicine after the  expiration date. NOTE: This sheet is a summary. It may not cover all possible information. If you have questions about this medicine, talk to your doctor, pharmacist, or health care provider.    2016, Elsevier/Gold Standard. (2015-04-17 01:15:14) Constipation, Adult Constipation is when a person has fewer than three bowel movements a week, has difficulty having a bowel movement, or has stools that are dry, hard, or larger than normal. As people grow older, constipation is more common. A low-fiber diet, not taking in enough fluids, and taking certain medicines may make constipation worse.  CAUSES   Certain medicines, such as antidepressants, pain medicine, iron supplements, antacids, and water pills.   Certain diseases, such as diabetes, irritable bowel syndrome (IBS), thyroid disease, or depression.   Not drinking enough water.   Not eating enough fiber-rich foods.   Stress or travel.   Lack of physical activity or exercise.   Ignoring the urge to have a bowel movement.   Using laxatives too much.  SIGNS AND SYMPTOMS   Having fewer than three bowel movements a week.   Straining to have a bowel movement.   Having stools that are hard, dry, or larger than normal.   Feeling full or bloated.   Pain in the lower abdomen.  Not feeling relief after having a bowel movement.  DIAGNOSIS  Your health care provider will take a medical history and perform a physical exam. Further testing may be done for severe constipation. Some tests may include:  A barium enema X-ray to examine your rectum, colon, and, sometimes, your small intestine.   A sigmoidoscopy to examine your lower colon.   A colonoscopy to examine your entire colon. TREATMENT  Treatment will depend on the severity of your constipation and what is causing it. Some dietary treatments include drinking more fluids and eating more fiber-rich foods. Lifestyle treatments may include regular exercise. If these  diet and lifestyle recommendations do not help, your health care provider may recommend taking over-the-counter laxative medicines to help you have bowel movements. Prescription medicines may be prescribed if over-the-counter medicines do not work.  HOME CARE INSTRUCTIONS   Eat foods that have a lot of fiber, such as fruits, vegetables, whole grains, and beans.  Limit foods high in fat and processed sugars, such as french fries, hamburgers, cookies, candies, and soda.   A fiber supplement may be added to your diet if you cannot get enough fiber from foods.   Drink enough fluids to keep your urine clear or pale yellow.   Exercise regularly or as directed by your health care provider.   Go to the restroom when you have the urge to go. Do not hold it.   Only take over-the-counter or prescription medicines as directed by your health care provider. Do not take other medicines for constipation without talking to your health care provider first.  [place] IF:   You have bright red blood in your stool.   Your constipation lasts for more than 4 days or gets worse.   You have abdominal or rectal pain.   You have thin, pencil-like stools.   You have unexplained weight loss. MAKE SURE YOU:   Understand these instructions.  Will watch your condition.  Will get help right away if you are not doing well or get worse.   This information is not intended to replace advice given to you by your health care provider. Make sure you discuss any questions you have with your health care provider.   Document Released: 09/01/2004 Document Revised: 12/25/2014 Document Reviewed: 09/15/2013 Elsevier Interactive Patient Education 2016 [employer]. Constipation, Adult Constipation is when a person has fewer than three bowel movements a week, has difficulty having a bowel movement, or has stools that are dry, hard, or larger than normal. As people grow older, constipation is  more common. A low-fiber diet, not taking in enough fluids, and taking certain medicines may make constipation worse.  CAUSES   Certain medicines, such as antidepressants, pain medicine, iron supplements, antacids, and water pills.   Certain diseases, such as diabetes, irritable bowel syndrome (IBS), thyroid disease, or depression.   Not drinking enough water.   Not eating enough fiber-rich foods.   Stress or travel.   Lack of physical activity or exercise.   Ignoring the urge to have a bowel movement.   Using laxatives too much.  SIGNS AND SYMPTOMS   Having fewer than three bowel movements a week.   Straining to have a bowel movement.   Having stools that are hard, dry, or larger than normal.   Feeling full or bloated.   Pain in the lower abdomen.   Not feeling relief after having a bowel movement.  DIAGNOSIS  Your health care provider will take a medical history  and perform a physical exam. Further testing may be done for severe constipation. Some tests may include:  A barium enema X-ray to examine your rectum, colon, and, sometimes, your small intestine.   A sigmoidoscopy to examine your lower colon.   A colonoscopy to examine your entire colon. TREATMENT  Treatment will depend on the severity of your constipation and what is causing it. Some dietary treatments include drinking more fluids and eating more fiber-rich foods. Lifestyle treatments may include regular exercise. If these diet and lifestyle recommendations do not help, your health care provider may recommend taking over-the-counter laxative medicines to help you have bowel movements. Prescription medicines may be prescribed if over-the-counter medicines do not work.  HOME CARE INSTRUCTIONS   Eat foods that have a lot of fiber, such as fruits, vegetables, whole grains, and beans.  Limit foods high in fat and processed sugars, such as french fries, hamburgers, cookies, candies, and soda.   A  fiber supplement may be added to your diet if you cannot get enough fiber from foods.   Drink enough fluids to keep your urine clear or pale yellow.   Exercise regularly or as directed by your health care provider.   Go to the restroom when you have the urge to go. Do not hold it.   Only take over-the-counter or prescription medicines as directed by your health care provider. Do not take other medicines for constipation without talking to your health care provider first.  [place] IF:   You have bright red blood in your stool.   Your constipation lasts for more than 4 days or gets worse.   You have abdominal or rectal pain.   You have thin, pencil-like stools.   You have unexplained weight loss. MAKE SURE YOU:   Understand these instructions.  Will watch your condition.  Will get help right away if you are not doing well or get worse.   This information is not intended to replace advice given to you by your health care provider. Make sure you discuss any questions you have with your health care provider.   Document Released: 09/01/2004 Document Revised: 12/25/2014 Document Reviewed: 09/15/2013 Elsevier Interactive Patient Education 2016 [place]  What are the risk, side effects and possible complications? Generally speaking, most procedures are safe.  However, with any procedure there are risks, side effects, and the possibility of complications.  The risks and complications are dependent upon the sites that are lesioned, or the type of nerve block to be performed.  The closer the procedure is to the spine, the more serious the risks are.  Great care is taken when placing the radio frequency needles, block needles or lesioning probes, but sometimes complications can occur.  Infection: Any time there is an injection through the skin, there is a risk of infection.  This is why sterile conditions are used for these  blocks.  There are four possible types of infection.  Localized skin infection.  Central Nervous System Infection-This can be in the form of Meningitis, which can be deadly.  Epidural Infections-This can be in the form of an epidural abscess, which can cause pressure inside of the spine, causing compression of the spinal cord with subsequent paralysis. This would require an emergency surgery to decompress, and there are no guarantees that the patient would recover from the paralysis.  Discitis-This is an infection of the intervertebral discs.  It occurs in about 1% of discography procedures.  It  is difficult to treat and it may lead to surgery.        2. Pain: the needles have to go through skin and soft tissues, will cause soreness.       3. Damage to internal structures:  The nerves to be lesioned may be near blood vessels or    other nerves which can be potentially damaged.       4. Bleeding: Bleeding is more common if the patient is taking blood thinners such as  aspirin, Coumadin, Ticiid, Plavix, etc., or if he/she have some genetic predisposition  such as hemophilia. Bleeding into the spinal canal can cause compression of the spinal  cord with subsequent paralysis.  This would require an emergency surgery to  decompress and there are no guarantees that the patient would recover from the  paralysis.       5. Pneumothorax:  Puncturing of a lung is a possibility, every time a needle is introduced in  the area of the chest or upper back.  Pneumothorax refers to free air around the  collapsed lung(s), inside of the thoracic cavity (chest cavity).  Another two possible  complications related to a similar event would include: Hemothorax and Chylothorax.   These are variations of the Pneumothorax, where instead of air around the collapsed  lung(s), you may have blood or chyle, respectively.       6. Spinal headaches: They may occur with any procedures in the area of the spine.       7. Persistent CSF  (Cerebro-Spinal Fluid) leakage: This is a rare problem, but may occur  with prolonged intrathecal or epidural catheters either due to the formation of a fistulous  track or a dural tear.       8. Nerve damage: By working so close to the spinal cord, there is always a possibility of  nerve damage, which could be as serious as a permanent spinal cord injury with  paralysis.       9. Death:  Although rare, severe deadly allergic reactions known as "Anaphylactic  reaction" can occur to any of the medications used.      10. Worsening of the symptoms:  We can always make thing worse.  What are the chances of something like this happening? Chances of any of this occuring are extremely low.  By statistics, you have more of a chance of getting killed in a motor vehicle accident: while driving to the hospital than any of the above occurring .  Nevertheless, you should be aware that they are possibilities.  In general, it is similar to taking a shower.  Everybody knows that you can slip, hit your head and get killed.  Does that mean that you should not shower again?  Nevertheless always keep in mind that statistics do not mean anything if you happen to be on the wrong side of them.  Even if a procedure has a 1 (one) in a 1,000,000 (million) chance of going wrong, it you happen to be that one..Also, keep in mind that by statistics, you have more of a chance of having something go wrong when taking medications.  Who should not have this procedure? If you are on a blood thinning medication (e.g. Coumadin, Plavix, see list of "Blood Thinners"), or if you have an active infection going on, you should not have the procedure.  If you are taking any blood thinners, please inform your physician.  How should I prepare for this procedure?  Do not  eat or drink anything at least six hours prior to the procedure.  Bring a driver with you .  It cannot be a taxi.  Come accompanied by an adult that can drive you back, and that  is strong enough to help you if your legs get weak or numb from the local anesthetic.  Take all of your medicines the morning of the procedure with just enough water to swallow them.  If you have diabetes, make sure that you are scheduled to have your procedure done first thing in the morning, whenever possible.  If you have diabetes, take only half of your insulin dose and notify our nurse that you have done so as soon as you arrive at the clinic.  If you are diabetic, but only take blood sugar pills (oral hypoglycemic), then do not take them on the morning of your procedure.  You may take them after you have had the procedure.  Do not take aspirin or any aspirin-containing medications, at least eleven (11) days prior to the procedure.  They may prolong bleeding.  Wear loose fitting clothing that may be easy to take off and that you would not mind if it got stained with Betadine or blood.  Do not wear any jewelry or perfume  Remove any nail coloring.  It will interfere with some of our monitoring equipment.  NOTE: Remember that this is not meant to be interpreted as a complete list of all possible complications.  Unforeseen problems may occur.  BLOOD THINNERS The following drugs contain aspirin or other products, which can cause increased bleeding during surgery and should not be taken for 2 weeks prior to and 1 week after surgery.  If you should need take something for relief of minor pain, you may take acetaminophen which is found in Tylenol,m Datril, Anacin-3 and Panadol. It is not blood thinner. The products listed below are.  Do not take any of the products listed below in addition to any listed on your instruction sheet.  A.P.C or A.P.C with Codeine Codeine Phosphate Capsules #3 Ibuprofen Ridaura  ABC compound Congesprin Imuran rimadil  Advil Cope Indocin Robaxisal  Alka-Seltzer Effervescent Pain Reliever and Antacid Coricidin or Coricidin-D  Indomethacin Rufen  Alka-Seltzer plus  Cold Medicine Cosprin Ketoprofen S-A-C Tablets  Anacin Analgesic Tablets or Capsules Coumadin Korlgesic Salflex  Anacin Extra Strength Analgesic tablets or capsules CP-2 Tablets Lanoril Salicylate  Anaprox Cuprimine Capsules Levenox Salocol  Anexsia-D Dalteparin Magan Salsalate  Anodynos Darvon compound Magnesium Salicylate Sine-off  Ansaid Dasin Capsules Magsal Sodium Salicylate  Anturane Depen Capsules Marnal Soma  APF Arthritis pain formula Dewitt's Pills Measurin Stanback  Argesic Dia-Gesic Meclofenamic Sulfinpyrazone  Arthritis Bayer Timed Release Aspirin Diclofenac Meclomen Sulindac  Arthritis pain formula Anacin Dicumarol Medipren Supac  Analgesic (Safety coated) Arthralgen Diffunasal Mefanamic Suprofen  Arthritis Strength Bufferin Dihydrocodeine Mepro Compound Suprol  Arthropan liquid Dopirydamole Methcarbomol with Aspirin Synalgos  ASA tablets/Enseals Disalcid Micrainin Tagament  Ascriptin Doan's Midol Talwin  Ascriptin A/D Dolene Mobidin Tanderil  Ascriptin Extra Strength Dolobid Moblgesic Ticlid  Ascriptin with Codeine Doloprin or Doloprin with Codeine Momentum Tolectin  Asperbuf Duoprin Mono-gesic Trendar  Aspergum Duradyne Motrin or Motrin IB Triminicin  Aspirin plain, buffered or enteric coated Durasal Myochrisine Trigesic  Aspirin Suppositories Easprin Nalfon Trillsate  Aspirin with Codeine Ecotrin Regular or Extra Strength Naprosyn Uracel  Atromid-S Efficin Naproxen Ursinus  Auranofin Capsules Elmiron Neocylate Vanquish  Axotal Emagrin Norgesic Verin  Azathioprine Empirin or Empirin with Codeine Normiflo Vitamin E  Azolid  Emprazil Nuprin Voltaren  Bayer Aspirin plain, buffered or children's or timed BC Tablets or powders Encaprin Orgaran Warfarin Sodium  Buff-a-Comp Enoxaparin Orudis Zorpin  Buff-a-Comp with Codeine Equegesic Os-Cal-Gesic   Buffaprin Excedrin plain, buffered or Extra Strength Oxalid   Bufferin Arthritis Strength Feldene Oxphenbutazone   Bufferin  plain or Extra Strength Feldene Capsules Oxycodone with Aspirin   Bufferin with Codeine Fenoprofen Fenoprofen Pabalate or Pabalate-SF   Buffets II Flogesic Panagesic   Buffinol plain or Extra Strength Florinal or Florinal with Codeine Panwarfarin   Buf-Tabs Flurbiprofen Penicillamine   Butalbital Compound Four-way cold tablets Penicillin   Butazolidin Fragmin Pepto-Bismol   Carbenicillin Geminisyn Percodan   Carna Arthritis Reliever Geopen Persantine   Carprofen Gold's salt Persistin   Chloramphenicol Goody's Phenylbutazone   Chloromycetin Haltrain Piroxlcam   Clmetidine heparin Plaquenil   Cllnoril Hyco-pap Ponstel   Clofibrate Hydroxy chloroquine Propoxyphen         Before stopping any of these medications, be sure to consult the physician who ordered them.  Some, such as Coumadin (Warfarin) are ordered to prevent or treat serious conditions such as "deep thrombosis", "pumonary embolisms", and other heart problems.  The amount of time that you may need off of the medication may also vary with the medication and the reason for which you were taking it.  If you are taking any of these medications, please make sure you notify your pain physician before you undergo any procedures.         Please complete Post Procedure Pain Diary Pain Management Discharge Instructions  General Discharge Instructions :  If you need to reach your doctor call: Monday-Friday 8:00 am - 4:00 pm at [phone] or toll free [phone].  After clinic hours [phone] to have operator reach doctor.  Bring all of your medication bottles to all your appointments in the pain clinic.  To cancel or reschedule your appointment with Pain Management please remember to call 24 hours in advance to avoid a fee.  Refer to the educational materials which you have been given on: General Risks, I had my Procedure. Discharge Instructions, Post Sedation.  Post Procedure Instructions:  The drugs you were given will  stay in your system until tomorrow, so for the next 24 hours you should not drive, make any legal decisions or drink any alcoholic beverages.  You may eat anything you prefer, but it is better to start with liquids then soups and crackers, and gradually work up to solid foods.  Please notify your doctor immediately if you have any unusual bleeding, trouble breathing or pain that is not related to your normal pain.  Depending on the type of procedure that was done, some parts of your body may feel week and/or numb.  This usually clears up by tonight or the next day.  Walk with the use of an assistive device or accompanied by an adult for the 24 hours.  You may use ice on the affected area for the first 24 hours.  Put ice in a Ziploc bag and cover with a towel and place against area 15 minutes on 15 minutes off.  You may switch to heat after 24 hours.

## 2016-05-02 NOTE — Progress Notes (Signed)
Patient's Name: Wanda Martinez  Patient type: Established  MRN: [id number]  Service setting: Ambulatory outpatient  DOB: 1935/04/27  Location: [facility]  DOS: 05/02/2016  Primary Care Physician: Einar Pheasant, MD  Note by: Kathlen Brunswick. Dossie Arbour, M.D, DABA, DABAPM, DABPM, Milagros Evener, FIPP  Referring Physician: Milinda Pointer, MD  Specialty: Board-Certified Interventional Pain Management  Last Visit to Pain Management: 03/27/2016   Primary Reason(s) for Visit: Interventional Pain Management Treatment. CC: Hip Pain and Leg Pain  Primary Diagnosis: Facet syndrome, lumbar [M54.5]   Procedure:  Anesthesia, Analgesia, Anxiolysis:  Type: Therapeutic Medial Branch Facet Radiofrequency Ablation Region: Lumbar Level: L2, L3, L4, L5, & S1 Medial Branch Level(s) Laterality: Right-Sided  Indications: 1. Facet syndrome, lumbar   2. Lumbar spondylosis, unspecified spinal osteoarthritis   3. Chronic low back pain (Location of Primary Source of Pain) (Bilateral) (R>L)     Pre-procedure Pain Score: 7/10 Reported level of pain is compatible with clinical observations Post-procedure Pain Score: 0-No pain  The patient has failed to respond to conservative therapies including over-the-counter medications, anti-inflammatories, muscle relaxants, membrane stabilizers, opioids, physical therapy, modalities such as heat and ice, as well as more invasive techniques such as nerve blocks. The patient did attained more than 50% relief of the pain from a series of diagnostic injections conducted in separate occasions.  Type: Moderate (Conscious) Sedation & Local Anesthesia Local Anesthetic: Lidocaine 1% Route: Intravenous (IV) IV Access: Secured Sedation: Meaningful verbal contact was maintained at all times during the procedure  Indication(s): Analgesia & Anxiolysis   Pre-Procedure Assessment:  Wanda Martinez is a 81 y.o. year old, female patient, seen today for interventional  treatment. She has Hypercholesterolemia; Essential hypertension; DYSPNEA; CHEST PAIN; Frequent UTI; Cystocele; Breast cancer ([place]); Anemia; Tremor; Toenail fungus; Skin lesions; Headache; Obesity (BMI 30-39.9); Dysuria; Sinusitis; Health care maintenance; Parkinsons ([place]); Fatigue; Breast cancer, right ([place]); Absolute anemia; Chronic kidney disease (CKD), stage II (mild); Cystocele, midline; Clinical depression; Benign essential HTN; Incomplete bladder emptying; Mixed incontinence; HLD (hyperlipidemia); Bladder infection, chronic; Parkinson's disease ([place]); Pure hypercholesterolemia; Hernia, rectovaginal; Long term current use of opiate analgesic; Long term prescription opiate use; Opiate use; Encounter for therapeutic drug level monitoring; Encounter for chronic pain management; Chronic low back pain (Location of Primary Source of Pain) (Bilateral) (R>L); Lumbar spondylosis; Chronic hip pain; Chronic neck pain; Cervical spondylosis; Chronic cervical radicular pain (Right); Chronic upper extremity pain (Right); Chronic pain; Osteoarthritis; Diffuse myofascial pain syndrome; Neurogenic pain; Chronic upper back pain (Right); Myofascial pain syndrome (Right) (cervicothoracic); Vaginitis; Adiposity; Lumbar facet syndrome (Location of Primary Source of Pain) (Bilateral) (R>L); and Malignant neoplasm of right female breast ([place]) on her problem list.. Her primarily concern today is the Hip Pain and Leg Pain   Pain Type: Chronic pain Pain Location: Back Pain Orientation: Lower, Right Pain Descriptors / Indicators: Aching, Dull, Constant Pain Frequency: Constant  Date of Last Visit: 03/27/16 Service Provided on Last Visit: Med Refill  Verification of the correct person, correct site (including marking of site), and correct procedure were performed and confirmed by the patient.  Consent: Secured. Under the influence of no sedatives a written informed consent was obtained, after having provided information on  the risks and possible complications. To fulfill our ethical and legal obligations, as recommended by the American Medical Association's Code of Ethics, we have provided information to the patient about our clinical impression; the nature and purpose of the treatment or procedure; the risks, benefits, and possible complications of the intervention; alternatives; the risk(s) and  benefit(s) of the alternative treatment(s) or procedure(s); and the risk(s) and benefit(s) of doing nothing. The patient was provided information about the risks and possible complications associated with the procedure. These include, but are not limited to, failure to achieve desired goals, infection, bleeding, organ or nerve damage, allergic reactions, paralysis, and death. In the case of spinal procedures these may include, but are not limited to, failure to achieve desired goals, infection, bleeding, organ or nerve damage, allergic reactions, paralysis, and death. In addition, the patient was informed that Medicine is not an exact science; therefore, there is also the possibility of unforeseen risks and possible complications that may result in a catastrophic outcome. The patient indicated having understood very clearly. We have given the patient no guarantees and we have made no promises. Enough time was given to the patient to ask questions, all of which were answered to the patient's satisfaction.  Consent Attestation: I, the ordering provider, attest that I have discussed with the patient the benefits, risks, side-effects, alternatives, likelihood of achieving goals, and potential problems during recovery for the procedure that I have provided informed consent.  Pre-Procedure Preparation: Safety Precautions: Allergies reviewed. Appropriate site, procedure, and patient were confirmed by following the Joint Commission's Universal Protocol (UP.01.01.01), in the form of a "Time Out". The patient was asked to confirm marked site and  procedure, before commencing. The patient was asked about blood thinners, or active infections, both of which were denied. Patient was assessed for positional comfort and all pressure points were checked before starting procedure. Allergies: She is allergic to vesicare.. Infection Control Precautions: Aseptic technique used. Standard Universal Precautions were taken as recommended by the Department of [facility] for Disease Control and Prevention (CDC). Standard pre-surgical skin prep was conducted. Respiratory hygiene and cough etiquette was practiced. Hand hygiene observed. Safe injection practices and needle disposal techniques followed. SDV (single dose vial) medications used. Medications properly checked for expiration dates and contaminants. Personal protective equipment (PPE) used: Sterile Radiation-resistant gloves. Monitoring:  As per clinic protocol. Filed Vitals:   05/02/16 1252 05/02/16 1302 05/02/16 1311 05/02/16 1320  BP: 123/67 179/79 163/81 162/76  Pulse: 62 63 63 62  Temp:   97.7 F (36.5 C)   Resp: '15 14 14 15  '$ Height:      Weight:      SpO2: 92% 95% 95% 97%  Calculated BMI: Body mass index is 30.88 kg/(m^2).  Description of Procedure Process:   Time-out: "Time-out" completed before starting procedure, as per protocol. Position: Prone Target Area: For Lumbar Facet blocks, the target is the groove formed by the junction of the transverse process and superior articular process. For the L5 dorsal ramus, the target is the notch between superior articular process and sacral ala. For the S1 dorsal ramus, the target is the superior and lateral edge of the posterior S1 Sacral foramen. Approach: Paraspinal approach. Area Prepped: Entire Posterior Lumbosacral Region Prepping solution: ChloraPrep (2% chlorhexidine gluconate and 70% isopropyl alcohol) Safety Precautions: Aspiration looking for blood return was conducted prior to all injections. At no point did we inject any  substances, as a needle was being advanced. No attempts were made at seeking any paresthesias. Safe injection practices and needle disposal techniques used. Medications properly checked for expiration dates. SDV (single dose vial) medications used.   Description of the Procedure: Protocol guidelines were followed. The patient was placed in position over the fluoroscopy table. The target area was identified and the area prepped in the usual manner. Skin  desensitized using vapocoolant spray. Skin & deeper tissues infiltrated with local anesthetic. Appropriate amount of time allowed to pass for local anesthetics to take effect. Radiofrequency needles were introduced to the area of the medial branch at the junction of the superior articular process and transverse process using fluoroscopy. Using the [employer], sensory stimulation using 50 Hz was used to locate & identify the nerve, making sure that the needle was positioned such that there was no sensory stimulation below 0.3 V or above 0.7 V. Stimulation using 2 Hz was used to evaluate the motor component. Care was taken not to lesion any nerves that demonstrated motor stimulation of the lower extremities at an output of less than 2.5 times that of the sensory threshold, or a maximum of 2.0 V. Once satisfactory placement of the needles was achieved, the above solution was slowly injected after negative aspiration. After waiting for at least 2 minutes, the ablation was performed at 80 degrees C for 60 seconds.The needles were then removed and the area cleansed, making sure to leave some of the prepping solution back to take advantage of its long term bactericidal properties. EBL: None Materials & Medications Used:  Needle(s) Used: Teflon-Coated Radiofrequency Needles  Imaging Guidance:   Type of Imaging Technique: Fluoroscopy Guidance (Spinal) Indication(s): Assistance in needle guidance and placement for procedures requiring needle  placement in or near specific anatomical locations not easily accessible without such assistance. Exposure Time: Please see nurses notes. Contrast: None required. Fluoroscopic Guidance: I was personally present in the fluoroscopy suite, where the patient was placed in position for the procedure, over the fluoroscopy-compatible table. Fluoroscopy was manipulated, using "Tunnel Vision Technique", to obtain the best possible view of the target area, on the affected side. Parallax error was corrected before commencing the procedure. A "direction-depth-direction" technique was used to introduce the needle under continuous pulsed fluoroscopic guidance. Once the target was reached, antero-posterior, oblique, and lateral fluoroscopic projection views were taken to confirm needle placement in all planes. Permanently recorded images stored by scanning into EMR. Interpretation: Intraoperative imaging interpretation by performing Physician. Adequate needle placement confirmed.  Antibiotic Prophylaxis:  Indication(s): No indications identified. Type:  Antibiotics Given (last 72 hours)    None       Post-operative Assessment:   Complications: No immediate post-treatment complications were observed Disposition: Return to clinic for follow-up evaluation. The patient tolerated the entire procedure well. A repeat set of vitals were taken after the procedure and the patient was kept under observation following institutional policy, for this procedure. Post-procedural neurological assessment was performed, showing return to baseline, prior to discharge. The patient was discharged home, once institutional criteria were met. The patient was provided with post-procedure discharge instructions, including a section on how to identify potential problems. Should any problems arise concerning this procedure, the patient was given instructions to immediately contact us, at any time, without hesitation. In any case, we plan to  contact the patient by telephone for a follow-up status report regarding this interventional procedure. Comments:  No additional relevant information  Medications administered during this visit: We administered ropivacaine (PF) 2 mg/ml (0.2%), fentaNYL, triamcinolone acetonide, and midazolam.  Prescriptions ordered during this visit: New Prescriptions   OXYCODONE (OXY IR/ROXICODONE) 5 MG IMMEDIATE RELEASE TABLET    Take 1 tablet (5 mg total) by mouth every 8 (eight) hours as needed for severe pain or breakthrough pain (PRN for post-radiofrequency pain).    Requested PM Follow-up: Return for Post-RFA Eval (6 weeks).  Future Appointments Date  Time Provider [place]  05/04/2016 11:00 AM Noreene Filbert, MD [facility] None  05/31/2016 11:00 AM Einar Pheasant, MD [facility] None  06/12/2016 11:20 AM Milinda Pointer, MD [facility] None  06/21/2016 1:00 PM [facility]  06/26/2016 11:00 PM Milinda Pointer, MD [facility] None  09/26/2016 11:30 AM Lloyd Huger, MD [facility] None    Primary Care Physician: Einar Pheasant, MD Location: [facility] Note by: Kathlen Brunswick. Dossie Arbour, M.D, DABA, DABAPM, DABPM, DABIPP, FIPP Disclaimer:  Medicine is not an exact science. The only guarantee in medicine is that nothing is guaranteed. It is important to note that the decision to proceed with this intervention was based on the information collected from the patient. The Data and conclusions were drawn from the patient's questionnaire, the interview, and the physical examination. Because the information was provided in large part by the patient, it cannot be guaranteed that it has not been purposely or unconsciously manipulated. Every effort has been made to obtain as much relevant data as possible for this evaluation. It is important to note that the conclusions that lead to this procedure are derived in large part from the available data. Always take into  account that the treatment will also be dependent on availability of resources and existing treatment guidelines, considered by other Pain Management Practitioners as being common knowledge and practice, at the time of the intervention. For Medico-Legal purposes, it is also important to point out that variation in procedural techniques and pharmacological choices are the acceptable norm. The indications, contraindications, technique, and results of the above procedure should only be interpreted and judged by a Board-Certified Interventional Pain Specialist with extensive familiarity and expertise in the same exact procedure and technique. Attempts at providing opinions without similar or greater experience and expertise than that of the treating physician will be considered as inappropriate and unethical, and shall result in a formal complaint to the state medical board and applicable specialty societies.

## 2016-05-02 NOTE — Progress Notes (Signed)
Safety precautions to be maintained throughout the outpatient stay will include: orient to surroundings, keep bed in low position, maintain call bell within reach at all times, provide assistance with transfer out of bed and ambulation.  

## 2016-05-03 ENCOUNTER — Telehealth: Payer: Self-pay | Admitting: *Deleted

## 2016-05-03 NOTE — Telephone Encounter (Signed)
No problems post procedure. 

## 2016-05-04 ENCOUNTER — Ambulatory Visit
Admission: RE | Admit: 2016-05-04 | Discharge: 2016-05-04 | Disposition: A | Discharge status: Home / Self Care | Payer: Medicare Other | Source: Ambulatory Visit | Attending: Radiation Oncology | Admitting: Radiation Oncology

## 2016-05-04 ENCOUNTER — Encounter: Payer: Self-pay | Admitting: Radiation Oncology

## 2016-05-04 VITALS — BP 149/82 | HR 66 | Temp 98.0°F | Ht 64.0 in | Wt 187.9 lb

## 2016-05-04 DIAGNOSIS — C50811 Malignant neoplasm of overlapping sites of right female breast: Secondary | ICD-10-CM | POA: Diagnosis not present

## 2016-05-04 DIAGNOSIS — Z170 Estrogen receptor positive status [ER+]: Secondary | ICD-10-CM | POA: Diagnosis not present

## 2016-05-04 NOTE — Progress Notes (Signed)
Radiation Oncology Follow up Note  Name: Wanda Martinez   Date:   05/04/2016 MRN:  [id number] DOB: [birth year]    This 81 y.o. female presents to the clinic today for follow-up for. Right sided breast cancer status post right modified radical mastectomy chest wall peripheral lymphatic radiation and chemotherapy now out 3 years  REFERRING PROVIDER: Einar Pheasant, MD  HPI: Patient is a 81 year old female now out 3 years having completed chest wall peripheral lymphatic radiation for 1.1 cm invasive mammary carcinoma ER positive PR borderline HER-2/neu not overexpressed. She had chemotherapy plus chest wall peripheral lymphatic radiation for a sentinel lymph node showing micrometastatic disease at 8 mm. Patient is not a candidate for mammograms of the left breast. She had an area of ridge and mastectomy scar on the right which last year I had a chest CT done which was normal. She specifically denies any new nodularity the right chest wall any masses or nodularity in the left breast any swelling of her right upper extremity. She is currently letrozole following that without side effect. She does have Parkinson's disease which seems to be stable if not progressing somewhat.  COMPLICATIONS OF TREATMENT: none  FOLLOW UP COMPLIANCE: keeps appointments   PHYSICAL EXAM:  BP 149/82 mmHg  Pulse 66  Temp(Src) 98 F (36.7 C) (Tympanic)  Ht '5\' 4"'$  (1.626 m)  Wt 187 lb 15.1 oz (85.25 kg)  BMI 32.24 kg/m2  LMP 12/18/1981 Right chest wall still has a ridge of scar tissue which we've evaluated in the past. No other mass or nodularity is appreciated. Left breast is free of dominant mass or nodularity in 2 positions examined. No axillary or supraclavicular adenopathy is identified. No lymphedema of her right upper extremity is noted. Well-developed well-nourished patient in NAD. HEENT reveals PERLA, EOMI, discs not visualized.  Oral cavity is clear. No oral mucosal lesions are identified. Neck is clear  without evidence of cervical or supraclavicular adenopathy. Lungs are clear to A&P. Cardiac examination is essentially unremarkable with regular rate and rhythm without murmur rub or thrill. Abdomen is benign with no organomegaly or masses noted. Motor sensory and DTR levels are equal and symmetric in the upper and lower extremities. Cranial nerves II through XII are grossly intact. Proprioception is intact. No peripheral adenopathy or edema is identified. No motor or sensory levels are noted. Crude visual fields are within normal range.  RADIOLOGY RESULTS: Prior CT scan is reviewed  PLAN: Present time she is now 3 years out with no evidence of disease. She is doing well. She continues on letrozole without side effect. I'm please were overall progress. She knows to call with any concerns. I have asked to see her back in 1 year for follow-up.  I would like to take this opportunity for allowing me to participate in the care of your patient.Armstead Peaks., MD

## 2016-05-04 NOTE — Progress Notes (Signed)
Patient here for follow up breast CA no complaints today.

## 2016-05-12 ENCOUNTER — Telehealth: Payer: Self-pay | Admitting: *Deleted

## 2016-05-12 NOTE — Telephone Encounter (Signed)
sw pt informed her that Dr. Dossie Arbour changed his template. gave appt for 06/22/16@10 :20am. pt is aware.Marland KitchenMarland KitchenTD

## 2016-05-24 DIAGNOSIS — F329 Major depressive disorder, single episode, unspecified: Secondary | ICD-10-CM | POA: Diagnosis not present

## 2016-05-24 DIAGNOSIS — G20 Parkinson's disease: Secondary | ICD-10-CM | POA: Diagnosis not present

## 2016-05-24 DIAGNOSIS — M542 Cervicalgia: Secondary | ICD-10-CM | POA: Diagnosis not present

## 2016-05-24 DIAGNOSIS — K5909 Other constipation: Secondary | ICD-10-CM | POA: Diagnosis not present

## 2016-05-24 DIAGNOSIS — R251 Tremor, unspecified: Secondary | ICD-10-CM | POA: Diagnosis not present

## 2016-05-26 ENCOUNTER — Other Ambulatory Visit: Payer: Self-pay | Admitting: Pain Medicine

## 2016-05-26 DIAGNOSIS — K5909 Other constipation: Secondary | ICD-10-CM | POA: Insufficient documentation

## 2016-05-26 DIAGNOSIS — K5900 Constipation, unspecified: Secondary | ICD-10-CM | POA: Insufficient documentation

## 2016-05-31 ENCOUNTER — Ambulatory Visit: Payer: No Typology Code available for payment source | Admitting: Internal Medicine

## 2016-06-02 ENCOUNTER — Ambulatory Visit (INDEPENDENT_AMBULATORY_CARE_PROVIDER_SITE_OTHER): Payer: Medicare Other | Admitting: Internal Medicine

## 2016-06-02 ENCOUNTER — Encounter: Payer: Self-pay | Admitting: Internal Medicine

## 2016-06-02 VITALS — BP 120/80 | HR 66 | Temp 98.2°F | Resp 18 | Ht 64.0 in | Wt 185.2 lb

## 2016-06-02 DIAGNOSIS — E7800 Pure hypercholesterolemia, unspecified: Secondary | ICD-10-CM

## 2016-06-02 DIAGNOSIS — I10 Essential (primary) hypertension: Secondary | ICD-10-CM | POA: Diagnosis not present

## 2016-06-02 DIAGNOSIS — K5900 Constipation, unspecified: Secondary | ICD-10-CM

## 2016-06-02 DIAGNOSIS — C50919 Malignant neoplasm of unspecified site of unspecified female breast: Secondary | ICD-10-CM | POA: Diagnosis not present

## 2016-06-02 DIAGNOSIS — D649 Anemia, unspecified: Secondary | ICD-10-CM | POA: Diagnosis not present

## 2016-06-02 DIAGNOSIS — F329 Major depressive disorder, single episode, unspecified: Secondary | ICD-10-CM

## 2016-06-02 DIAGNOSIS — E669 Obesity, unspecified: Secondary | ICD-10-CM

## 2016-06-02 DIAGNOSIS — F32A Depression, unspecified: Secondary | ICD-10-CM

## 2016-06-02 DIAGNOSIS — G20 Parkinson's disease: Secondary | ICD-10-CM

## 2016-06-02 DIAGNOSIS — N182 Chronic kidney disease, stage 2 (mild): Secondary | ICD-10-CM

## 2016-06-02 NOTE — Progress Notes (Signed)
Pre-visit discussion using our clinic review tool. No additional management support is needed unless otherwise documented below in the visit note.  

## 2016-06-02 NOTE — Progress Notes (Signed)
Patient ID: Wanda Martinez, female   DOB: 1935-07-31, 81 y.o.   MRN: [id number]   Subjective:    Patient ID: Wanda Martinez, female    DOB: 1935/07/27, 81 y.o.   MRN: [id number]  HPI  Patient here for a scheduled follow up appt.  She reports problems with constipation.  Has been more of an issue for the last several weeks.  When starts to finally have a bowel movement, then will notice emesis.  No emesis at other times.  Has been trying to increase fiber.  Eating prunes and apples.  Uses dulcolax prn.  Discussed miralax.  No chest pain.  No sob.  Sees oncology.  On letrozole.  Due to complete 04/2018.     Past Medical History  Diagnosis Date  . GERD (gastroesophageal reflux disease)   . Hiatal hernia   . HTN (hypertension)   . Chronic cystitis   . Urinary incontinence     mixed   . Arthritis   . Anemia   . Breast cancer ([place])     Masectomy - left - 1986   . Breast cancer [facility])     Mastectomy-right -2014  . Parkinson disease ([place])   . LBP (low back pain) 03/26/2014  . Back pain 11/01/2013  . Arm pain 07/26/2015  . Neck pain 11/01/2013  . Enthesopathy of hip 03/26/2014  . Degeneration of intervertebral disc of lumbosacral region 03/26/2014  . Arthritis, degenerative 03/26/2014   Past Surgical History  Procedure Laterality Date  . Breast biopsy  2013  . Tonsillectomy and adenoidectomy  81  . Vesicovaginal fistula closure w/ tah  1983  . Breast surgery  1986    s/p left mastectomy  . Breast enhancement surgery  1987  . Breast implant removal    . Breast implant removal Right 08/29/2012  . Mastectomy  08/2012    right  . Abdominal hysterectomy     Family History  Problem Relation Age of Onset  . Diabetes Father   . Stroke Father   . Colon polyps Father   . Stroke Mother   . Parkinson's disease Mother    Social History   Social History  . Marital Status: Married    Spouse Name: N/A  . Number of Children: 3  . Years of Education: N/A   Social History Main Topics    . Smoking status: Never Smoker   . Smokeless tobacco: Never Used     Comment: tobacco use - no  . Alcohol Use: No  . Drug Use: No  . Sexual Activity: Not Asked   Other Topics Concern  . None   Social History Narrative    Outpatient Encounter Prescriptions as of 06/02/2016  Medication Sig  . amLODipine (NORVASC) 2.5 MG tablet TAKE 1 TABLET BY MOUTH ONCE DAILY.  Marland Kitchen aspirin 81 MG EC tablet Take 81 mg by mouth daily as needed.    . Calcium Carbonate (CALCIUM 600) 1500 MG TABS Take 1 tablet by mouth daily.    . carbidopa-levodopa (SINEMET IR) 25-100 MG per tablet Take 1 tablet by mouth 3 (three) times daily.  . citalopram (CELEXA) 20 MG tablet Take 20 mg by mouth daily.  . cyclobenzaprine (FLEXERIL) 10 MG tablet Take 1 tablet (10 mg total) by mouth at bedtime.  . gabapentin (NEURONTIN) 300 MG capsule Take 1 capsule (300 mg total) by mouth 2 (two) times daily.  . hydrochlorothiazide (HYDRODIURIL) 25 MG tablet TAKE 1 TABLET BY MOUTH ONCE DAILY.  Marland Kitchen HYDROcodone-acetaminophen (NORCO/VICODIN)  5-325 MG tablet Take 1 tablet by mouth every 8 (eight) hours as needed for moderate pain or severe pain.  Marland Kitchen letrozole (FEMARA) 2.5 MG tablet Take 1 tablet (2.5 mg total) by mouth daily.  Marland Kitchen losartan (COZAAR) 100 MG tablet TAKE 1 TABLET BY MOUTH ONCE DAILY.  . magnesium oxide (MAGNESIUM-OXIDE) 400 (241.3 Mg) MG tablet Take 1 tablet (400 mg total) by mouth daily.  . meloxicam (MOBIC) 7.5 MG tablet Take 1 tablet (7.5 mg total) by mouth daily.  . metoprolol succinate (TOPROL-XL) 25 MG 24 hr tablet TAKE 1 TABLET BY MOUTH TWICE DAILY  . Multiple Vitamin (MULTIVITAMIN) tablet Take 1 tablet by mouth daily.  Marland Kitchen omeprazole (PRILOSEC) 20 MG capsule TAKE 1 CAPSULE BY MOUTH TWICE DAILY FOR REFLUX.  . pravastatin (PRAVACHOL) 20 MG tablet Take 1 tablet (20 mg total) by mouth daily.  . [DISCONTINUED] HYDROcodone-acetaminophen (NORCO/VICODIN) 5-325 MG tablet Take 1 tablet by mouth every 8 (eight) hours as needed for moderate  pain or severe pain.  . [DISCONTINUED] HYDROcodone-acetaminophen (NORCO/VICODIN) 5-325 MG tablet Take 1 tablet by mouth every 8 (eight) hours as needed for moderate pain or severe pain.  . [DISCONTINUED] oxyCODONE (OXY IR/ROXICODONE) 5 MG immediate release tablet Take 1 tablet (5 mg total) by mouth every 8 (eight) hours as needed for severe pain or breakthrough pain (PRN for post-radiofrequency pain).  . [DISCONTINUED] traZODone (DESYREL) 50 MG tablet TAKE ONE TABLET BY MOUTH AT BEDTIME. (Patient taking differently: TAKE ONE TABLET BY MOUTH AT BEDTIME. PRN)   Facility-Administered Encounter Medications as of 06/02/2016  Medication  . fentaNYL (SUBLIMAZE) injection 25-50 mcg  . lactated ringers infusion 1,000 mL  . lidocaine (PF) (XYLOCAINE) 1 % injection 10 mL  . midazolam (VERSED) 5 MG/5ML injection 1-2 mg  . ropivacaine (PF) 2 mg/ml (0.2%) (NAROPIN) epidural 9 mL  . triamcinolone acetonide (KENALOG-40) injection 40 mg    Review of Systems  Constitutional: Negative for appetite change and unexpected weight change.  HENT: Negative for congestion and sinus pressure.   Respiratory: Negative for cough, chest tightness and shortness of breath.   Cardiovascular: Negative for chest pain, palpitations and leg swelling.  Gastrointestinal: Positive for vomiting and constipation. Negative for diarrhea.  Genitourinary: Negative for dysuria and difficulty urinating.  Musculoskeletal: Negative for back pain and joint swelling.  Skin: Negative for color change and rash.  Neurological: Negative for dizziness, light-headedness and headaches.  Psychiatric/Behavioral: Negative for dysphoric mood and agitation.       Objective:    Physical Exam  Constitutional: She appears well-developed and well-nourished. No distress.  HENT:  Nose: Nose normal.  Mouth/Throat: Oropharynx is clear and moist.  Neck: Neck supple. No thyromegaly present.  Cardiovascular: Normal rate and regular rhythm.     Pulmonary/Chest: Breath sounds normal. No respiratory distress. She has no wheezes.  Abdominal: Soft. Bowel sounds are normal. There is no tenderness.  Musculoskeletal: She exhibits no edema or tenderness.  Lymphadenopathy:    She has no cervical adenopathy.  Skin: No rash noted. No erythema.  Psychiatric: She has a normal mood and affect. Her behavior is normal.    BP 120/80 mmHg  Pulse 66  Temp(Src) 98.2 F (36.8 C) (Oral)  Resp 18  Ht 5\' 4"  (1.626 m)  Wt 185 lb 4 oz (84.029 kg)  BMI 31.78 kg/m2  SpO2 99%  LMP 12/18/1981 Wt Readings from Last 3 Encounters:  06/02/16 185 lb 4 oz (84.029 kg)  05/04/16 187 lb 15.1 oz (85.25 kg)  05/02/16 180 lb (81.647  kg)     Lab Results  Component Value Date   WBC 7.6 06/15/2015   HGB 13.0 06/15/2015   HCT 39.5 06/15/2015   PLT 274.0 06/15/2015   GLUCOSE 83 02/01/2016   CHOL 220* 02/01/2016   TRIG 125.0 02/01/2016   HDL 57.80 02/01/2016   LDLDIRECT 90.0 02/17/2015   LDLCALC 137* 02/01/2016   ALT 5 02/01/2016   AST 21 02/01/2016   NA 134* 02/14/2016   K 4.8 02/01/2016   CL 95* 02/01/2016   CREATININE 1.10 02/01/2016   BUN 23 02/01/2016   CO2 32 02/01/2016   TSH 2.69 02/01/2016       Assessment & Plan:   Problem List Items Addressed This Visit    Anemia    Followed by oncology.  Colonoscopy 2013.        Breast cancer ([place])    Followed by Dr Grayland Ormond.  S/p bilateral mastectomy.  On letrozole.  Follow.       Chronic kidney disease (CKD), stage II (mild)    Creatinine has been stable.  Stay hydrated.  Follow metabolic panel.        Clinical depression    On celexa.  Stable.        Constipation    Constipation as outlined.  Discussed continuing to stay hydrated, etc.  Miralax.  Follow.  Notify me if persistent problems.        Essential hypertension    Blood pressure under good control.  Continue same medication regimen.  Follow pressures.  Follow metabolic panel.        Relevant Orders   Basic metabolic  panel   CBC with Differential/Platelet   Hypercholesterolemia - Primary    On pravastatin.  Low cholesterol diet and exercise.  Follow lipid panel and liver function tests.        Relevant Orders   Lipid panel   Hepatic function panel   Obesity (BMI 30-39.9)    Diet and exercise.        Parkins[facility])    Sees neurology.  Overall stable.           Einar Pheasant, MD

## 2016-06-07 ENCOUNTER — Encounter: Payer: Self-pay | Admitting: Internal Medicine

## 2016-06-07 NOTE — Assessment & Plan Note (Signed)
Creatinine has been stable.  Stay hydrated.  Follow metabolic panel.

## 2016-06-07 NOTE — Assessment & Plan Note (Signed)
Blood pressure under good control.  Continue same medication regimen.  Follow pressures.  Follow metabolic panel.   

## 2016-06-07 NOTE — Assessment & Plan Note (Signed)
Followed by oncology.  Colonoscopy 2013.

## 2016-06-07 NOTE — Assessment & Plan Note (Signed)
Constipation as outlined.  Discussed continuing to stay hydrated, etc.  Miralax.  Follow.  Notify me if persistent problems.

## 2016-06-07 NOTE — Assessment & Plan Note (Signed)
On celexa.  Stable.

## 2016-06-07 NOTE — Assessment & Plan Note (Signed)
On pravastatin.  Low cholesterol diet and exercise.  Follow lipid panel and liver function tests.   

## 2016-06-07 NOTE — Assessment & Plan Note (Signed)
Sees neurology.  Overall stable.

## 2016-06-07 NOTE — Assessment & Plan Note (Signed)
Followed by Dr Grayland Ormond.  S/p bilateral mastectomy.  On letrozole.  Follow.

## 2016-06-07 NOTE — Assessment & Plan Note (Signed)
Diet and exercise.   

## 2016-06-08 NOTE — Telephone Encounter (Signed)

## 2016-06-12 ENCOUNTER — Encounter: Payer: Self-pay | Admitting: Pain Medicine

## 2016-06-12 ENCOUNTER — Ambulatory Visit: Payer: Medicare Other | Attending: Pain Medicine | Admitting: Pain Medicine

## 2016-06-12 VITALS — BP 95/53 | HR 58 | Temp 98.1°F | Resp 16 | Ht 64.0 in | Wt 179.0 lb

## 2016-06-12 DIAGNOSIS — R079 Chest pain, unspecified: Secondary | ICD-10-CM | POA: Diagnosis not present

## 2016-06-12 DIAGNOSIS — M5412 Radiculopathy, cervical region: Secondary | ICD-10-CM

## 2016-06-12 DIAGNOSIS — L989 Disorder of the skin and subcutaneous tissue, unspecified: Secondary | ICD-10-CM | POA: Diagnosis not present

## 2016-06-12 DIAGNOSIS — N182 Chronic kidney disease, stage 2 (mild): Secondary | ICD-10-CM | POA: Diagnosis not present

## 2016-06-12 DIAGNOSIS — R51 Headache: Secondary | ICD-10-CM | POA: Diagnosis not present

## 2016-06-12 DIAGNOSIS — Z7982 Long term (current) use of aspirin: Secondary | ICD-10-CM | POA: Diagnosis not present

## 2016-06-12 DIAGNOSIS — B351 Tinea unguium: Secondary | ICD-10-CM | POA: Diagnosis not present

## 2016-06-12 DIAGNOSIS — M5124 Other intervertebral disc displacement, thoracic region: Secondary | ICD-10-CM | POA: Diagnosis not present

## 2016-06-12 DIAGNOSIS — M549 Dorsalgia, unspecified: Secondary | ICD-10-CM | POA: Diagnosis present

## 2016-06-12 DIAGNOSIS — M25559 Pain in unspecified hip: Secondary | ICD-10-CM | POA: Insufficient documentation

## 2016-06-12 DIAGNOSIS — E669 Obesity, unspecified: Secondary | ICD-10-CM | POA: Diagnosis not present

## 2016-06-12 DIAGNOSIS — M25511 Pain in right shoulder: Secondary | ICD-10-CM

## 2016-06-12 DIAGNOSIS — M47816 Spondylosis without myelopathy or radiculopathy, lumbar region: Secondary | ICD-10-CM | POA: Diagnosis not present

## 2016-06-12 DIAGNOSIS — M5116 Intervertebral disc disorders with radiculopathy, lumbar region: Secondary | ICD-10-CM | POA: Diagnosis not present

## 2016-06-12 DIAGNOSIS — Z6839 Body mass index (BMI) 39.0-39.9, adult: Secondary | ICD-10-CM | POA: Insufficient documentation

## 2016-06-12 DIAGNOSIS — R0600 Dyspnea, unspecified: Secondary | ICD-10-CM | POA: Diagnosis not present

## 2016-06-12 DIAGNOSIS — Z87440 Personal history of urinary (tract) infections: Secondary | ICD-10-CM | POA: Diagnosis not present

## 2016-06-12 DIAGNOSIS — Z79891 Long term (current) use of opiate analgesic: Secondary | ICD-10-CM | POA: Insufficient documentation

## 2016-06-12 DIAGNOSIS — G8929 Other chronic pain: Secondary | ICD-10-CM | POA: Insufficient documentation

## 2016-06-12 DIAGNOSIS — F329 Major depressive disorder, single episode, unspecified: Secondary | ICD-10-CM | POA: Insufficient documentation

## 2016-06-12 DIAGNOSIS — I129 Hypertensive chronic kidney disease with stage 1 through stage 4 chronic kidney disease, or unspecified chronic kidney disease: Secondary | ICD-10-CM | POA: Diagnosis not present

## 2016-06-12 DIAGNOSIS — M542 Cervicalgia: Secondary | ICD-10-CM | POA: Insufficient documentation

## 2016-06-12 DIAGNOSIS — M25519 Pain in unspecified shoulder: Secondary | ICD-10-CM | POA: Diagnosis not present

## 2016-06-12 DIAGNOSIS — E7800 Pure hypercholesterolemia, unspecified: Secondary | ICD-10-CM | POA: Diagnosis not present

## 2016-06-12 DIAGNOSIS — M47812 Spondylosis without myelopathy or radiculopathy, cervical region: Secondary | ICD-10-CM | POA: Insufficient documentation

## 2016-06-12 DIAGNOSIS — E785 Hyperlipidemia, unspecified: Secondary | ICD-10-CM | POA: Insufficient documentation

## 2016-06-12 DIAGNOSIS — M5382 Other specified dorsopathies, cervical region: Secondary | ICD-10-CM | POA: Diagnosis not present

## 2016-06-12 DIAGNOSIS — M47892 Other spondylosis, cervical region: Secondary | ICD-10-CM | POA: Diagnosis not present

## 2016-06-12 DIAGNOSIS — G20 Parkinson's disease: Secondary | ICD-10-CM | POA: Insufficient documentation

## 2016-06-12 DIAGNOSIS — Z853 Personal history of malignant neoplasm of breast: Secondary | ICD-10-CM | POA: Insufficient documentation

## 2016-06-12 DIAGNOSIS — M79601 Pain in right arm: Secondary | ICD-10-CM

## 2016-06-12 DIAGNOSIS — M545 Low back pain: Secondary | ICD-10-CM | POA: Diagnosis not present

## 2016-06-12 DIAGNOSIS — R32 Unspecified urinary incontinence: Secondary | ICD-10-CM | POA: Diagnosis not present

## 2016-06-12 NOTE — Progress Notes (Signed)
Safety precautions to be maintained throughout the outpatient stay will include: orient to surroundings, keep bed in low position, maintain call bell within reach at all times, provide assistance with transfer out of bed and ambulation.  

## 2016-06-12 NOTE — Progress Notes (Signed)
Patient's Name: Wanda Martinez  Patient type: Established  MRN: [id number]  Service setting: Ambulatory outpatient  DOB: 1935-12-03  Location: [facility]  DOS: 06/12/2016  Primary Care Physician: Einar Pheasant, MD  Note by: Kathlen Brunswick. Dossie Arbour, M.D, DABA, DABAPM, DABPM, Milagros Evener, FIPP  Referring Physician: Einar Pheasant, MD  Specialty: Board-Certified Interventional Pain Management  Last Visit to Pain Management: 05/26/2016   Primary Reason(s) for Visit: Encounter for prescription drug management (Level of risk: moderate) CC: Back Pain   HPI  Wanda Martinez is a 81 y.o. year old, female patient, who returns today as an established patient. She has Hypercholesterolemia; Essential hypertension; DYSPNEA; CHEST PAIN; Frequent UTI; Cystocele; Breast cancer ([place]); Anemia; Tremor; Toenail fungus; Skin lesions; Headache; Obesity (BMI 30-39.9); Dysuria; Sinusitis; Health care maintenance; Parkinsons ([place]); Fatigue; Breast cancer, right ([place]); Chronic kidney disease (CKD), stage II (mild); Cystocele, midline; Clinical depression; Incomplete bladder emptying; Mixed incontinence; HLD (hyperlipidemia); Bladder infection, chronic; Parkinson's disease ([place]); Pure hypercholesterolemia; Hernia, rectovaginal; Long term current use of opiate analgesic; Long term prescription opiate use; Opiate use; Encounter for therapeutic drug level monitoring; Encounter for chronic pain management; Chronic low back pain (Location of Primary Source of Pain) (Bilateral) (R>L); Lumbar spondylosis; Chronic hip pain; Chronic neck pain; Cervical spondylosis; Chronic cervical radicular pain (Right); Chronic upper extremity pain (Right); Chronic pain; Osteoarthritis; Diffuse myofascial pain syndrome; Neurogenic pain; Chronic upper back pain (Right); Myofascial pain syndrome (Right) (cervicothoracic); Vaginitis; Lumbar facet syndrome (Location of Primary Source of Pain) (Bilateral) (R>L); Malignant neoplasm of right  female breast ([place]); Chronic constipation; Cervical facet hypertrophy; Cervical facet syndrome; and Chronic shoulder pain on her problem list.. Her primarily concern today is the Back Pain   Pain Assessment: Self-Reported Pain Score: 4  Reported level is compatible with observation       Pain Type: Chronic pain Pain Location: Back Pain Orientation: Lower Pain Descriptors / Indicators: Aching, Dull, Constant Pain Frequency: Constant  The patient comes into the clinics today for pharmacological management of her chronic pain. I last saw this patient on 05/26/2016. The patient  reports that she does not use illicit drugs. Her body mass index is 30.71 kg/(m^2).  Date of Last Visit: 05/02/16 Service Provided on Last Visit: Procedure (right lumbar facet rf)  Controlled Substance Pharmacotherapy Assessment & REMS (Risk Evaluation and Mitigation Strategy)  Analgesic: Hydrocodone/APAP 5/325 one tablet every 8 hours (15 mg/day) MME/day: 15 mg/day.  Pill Count: The patient did not bring her medications to this appointment. Patient reminded that she has to always bring them to be counted. Pharmacokinetics: Onset of action (Liberation/Absorption): Within expected pharmacological parameters Time to Peak effect (Distribution): Timing and results are as within normal expected parameters Duration of action (Metabolism/Excretion): Within normal limits for medication Pharmacodynamics: Analgesic Effect: More than 50% Activity Facilitation: Medication(s) allow patient to sit, stand, walk, and do the basic ADLs Perceived Effectiveness: Described as relatively effective, allowing for increase in activities of daily living (ADL) Side-effects or Adverse reactions: None reported Monitoring: [place] PMP: Online review of the past 20-monthperiod conducted. Compliant with practice rules and regulations Last UDS on record: TOXASSURE SELECT 13  Date Value Ref Range Status  03/27/2016 FINAL  Final    Comment:     ==================================================================== TOXASSURE SELECT 13 (MW) ==================================================================== Test                             Result       Flag  Units Drug Present and Declared for Prescription Verification   Hydrocodone                    644          EXPECTED   ng/mg creat   Norhydrocodone                 707          EXPECTED   ng/mg creat    Sources of hydrocodone include scheduled prescription    medications. Norhydrocodone is an expected metabolite of    hydrocodone. ==================================================================== Test                      Result    Flag   Units      Ref Range   Creatinine              68               mg/dL      >=20 ==================================================================== Declared Medications:  The flagging and interpretation on this report are based on the  following declared medications.  Unexpected results may arise from  inaccuracies in the declared medications.  **Note: The testing scope of this panel includes these medications:  Hydrocodone (Norco)  Hydrocodone (Vicodin)  **Note: The testing scope of this panel does not include following  reported medications:  Acetaminophen (Norco)  Acetaminophen (Vicodin)  Amlodipine (Norvasc)  Aspirin  Carbidopa (Sinemet)  Citalopram (Celexa)  Cyclobenzaprine (Flexeril)  Gabapentin (Neurontin)  Hydrochlorothiazide (Hydrodiuril)  Letrozole (Femara)  Levodopa (Sinemet)  Losartan (Cozaar)  Magnesium (Mag-Ox)  Meloxicam (Mobic)  Metoprolol (Toprol)  Multivitamin  Omeprazole (Prilosec)  Pravastatin (Pravachol)  Supplement (OsCal)  Trazodone (Desyrel) ==================================================================== For clinical consultation, please call [phone]. ====================================================================    UDS interpretation: Compliant          Medication  Assessment Form: Reviewed. Patient indicates being compliant with therapy Treatment compliance: Compliant Risk Assessment: Aberrant Behavior: None observed today Substance Use Disorder (SUD) Risk Level: Low-to-moderate Risk of opioid abuse or dependence: 0.7-3.0% with doses ? 36 MME/day and 6.1-26% with doses ? 120 MME/day. Opioid Risk Tool (ORT) Score: Total Score: 0 Low Risk for SUD (Score <3) Depression Scale Score: PHQ-2: PHQ-2 Total Score: 0 No depression (0) PHQ-9: PHQ-9 Total Score: 0 No depression (0-4)  Pharmacologic Plan: No change in therapy, at this time  Laboratory Chemistry  Inflammation Markers No results found for: ESRSEDRATE, CRP  Renal Function Lab Results  Component Value Date   BUN 23 02/01/2016   CREATININE 1.10 02/01/2016   GFRAA >60 10/08/2014   GFRNONAA >60 10/08/2014    Hepatic Function Lab Results  Component Value Date   AST 21 02/01/2016   ALT 5 02/01/2016   ALBUMIN 4.6 02/01/2016    Electrolytes Lab Results  Component Value Date   NA 134* 02/14/2016   K 4.8 02/01/2016   CL 95* 02/01/2016   CALCIUM 9.9 02/01/2016   MG 1.8 04/21/2013    Pain Modulating Vitamins Lab Results  Component Value Date   VITAMINB12 881 02/01/2016    Coagulation Parameters Lab Results  Component Value Date   PLT 274.0 06/15/2015    Note: Labs Reviewed.  Recent Diagnostic Imaging  Cervical Imaging: Cervical DG complete:  Results for orders placed during the hospital encounter of 12/29/15  DG Cervical Spine Complete   Narrative CLINICAL DATA:  Neck pain for a few weeks, no known injury, initial encounter  EXAM: CERVICAL  SPINE - COMPLETE 4+ VIEW  COMPARISON:  None.  FINDINGS: Seven cervical segments are well visualized. Disc space narrowing and osteophytic changes are noted from C3-C7. Multilevel facet hypertrophic changes are seen. No acute fracture or acute facet abnormality is noted. No gross soft tissue abnormality is seen. The odontoid is  within normal limits.  IMPRESSION: Degenerative change without acute abnormality.   Electronically Signed   By: Inez Catalina M.D.   On: 12/29/2015 16:13    Shoulder Imaging: Shoulder-L DG:  Results for orders placed in visit on 01/09/14  DG Shoulder Left   Narrative * PRIOR REPORT IMPORTED FROM AN EXTERNAL SYSTEM *   CLINICAL DATA:  Bilateral shoulder pain   EXAM:  DG SHOULDER 3+VIEWS LEFT   COMPARISON:  None.   FINDINGS:  Three views of left shoulder submitted. No acute fracture or  subluxation. Glenohumeral joint is preserved.   IMPRESSION:  Negative.    Electronically Signed    By: Lahoma Crocker M.D.    On: 01/09/2014 14:07       Thoracic Imaging: Thoracic DG 2-3 views:  Results for orders placed during the hospital encounter of 10/28/13  DG Thoracic Spine 2 View   Narrative CLINICAL DATA:  Thoracic back pain.  EXAM: THORACIC SPINE - 2 VIEW  COMPARISON:  None.  FINDINGS: There is no evidence of thoracic spine fracture. Alignment is normal. No focal lytic or sclerotic bone lesions are seen.  Mild lower thoracic levoscoliosis noted. Mild degenerative disc disease is seen at T11-12, T12-L1, and L1-2. Mild to moderate degenerative disc disease also noted within the cervical spine.  IMPRESSION: No acute findings.  Mild lower thoracic spine degenerative disc disease and levoscoliosis. Cervical and lumbar degenerative disc disease also noted.   Electronically Signed   By: Earle Gell M.D.   On: 10/28/2013 16:29    Lumbosacral Imaging: Lumbar MR wo contrast:  Results for orders placed in visit on 07/24/13  MR Lumbar Spine Wo Contrast   Lumbar MR wo contrast:  Results for orders placed in visit on 04/28/13  MR L Spine Ltd W/O Cm   Narrative * PRIOR REPORT IMPORTED FROM AN EXTERNAL SYSTEM *   PRIOR REPORT IMPORTED FROM THE [facility] [place] EXAM:    lumbar radiculitis  COMMENTS:   PROCEDURE:     MMR - MMR LUMBAR SPINE WO  CONTRAST  - Apr 28 2013  3:41PM   RESULT:     MRI LUMBAR SPINE WITHOUT CONTRAST   HISTORY: Lumbar radiculitis   COMPARISON: 03/20/2012.   TECHNIQUE: Multiplanar and multisequence MRI of the lumbar spine were  obtained, without administration of IV contrast.   FINDINGS:   The vertebral bodies of the lumbar spine are normal in size and alignment.  There is normal bone marrow signal demonstrated throughout the vertebra.  There is a mild dextrocurvature of the lumbar spine. There is degenerative  disc disease throughout the lumbar spine most severe at L1-L2, L2-L3 and  L4-L5.   The spinal cord is of normal volume and contour. The cord terminates  normally at L1 . The nerve roots of the cauda equina and the filum  terminale  have the usual appearance.   The visualized portions of the SI joints are unremarkable.   The imaged intra-abdominal contents are unremarkable.   T12-L1: Mild broad-based disc bulge. No evidence of neural foraminal or  central stenosis.   L1-L2: Mild broad-based disc bulge. No evidence of neural foraminal or  central  stenosis.   L2-L3: Mild broad-based disc bulge. Mild bilateral facet arthropathy. No  evidence of neural foraminal or central stenosis.   L3-L4: Mild broad-based disc bulge. Mild bilateral facet arthropathy. No  evidence of neural foraminal or central stenosis.   L4-L5: Mild broad-based disc bulge. Mild bilateral facet arthropathy. No  evidence of neural foraminal or central stenosis.   L5-S1: Mild broad-based disc bulge. No evidence of neural foraminal or  central stenosis.   IMPRESSION:   1. Lumbar spine spondylosis as described above.   Dictation Site: 1       Note: Imaging reviewed.  Meds  The patient has a current medication list which includes the following prescription(s): amlodipine, aspirin, calcium carbonate, carbidopa-levodopa, ciprofloxacin, citalopram, cyclobenzaprine, gabapentin, hydrochlorothiazide,  hydrocodone-acetaminophen, letrozole, losartan, magnesium oxide, meloxicam, metoprolol succinate, multivitamin, omeprazole, and pravastatin.  Current Outpatient Prescriptions on File Prior to Visit  Medication Sig  . amLODipine (NORVASC) 2.5 MG tablet TAKE 1 TABLET BY MOUTH ONCE DAILY.  Marland Kitchen aspirin 81 MG EC tablet Take 81 mg by mouth daily as needed.    . Calcium Carbonate (CALCIUM 600) 1500 MG TABS Take 1 tablet by mouth daily.    . carbidopa-levodopa (SINEMET IR) 25-100 MG per tablet Take 1 tablet by mouth 3 (three) times daily.  . citalopram (CELEXA) 20 MG tablet Take 20 mg by mouth daily.  . cyclobenzaprine (FLEXERIL) 10 MG tablet Take 1 tablet (10 mg total) by mouth at bedtime.  . gabapentin (NEURONTIN) 300 MG capsule Take 1 capsule (300 mg total) by mouth 2 (two) times daily.  . hydrochlorothiazide (HYDRODIURIL) 25 MG tablet TAKE 1 TABLET BY MOUTH ONCE DAILY.  Marland Kitchen HYDROcodone-acetaminophen (NORCO/VICODIN) 5-325 MG tablet Take 1 tablet by mouth every 8 (eight) hours as needed for moderate pain or severe pain.  Marland Kitchen letrozole (FEMARA) 2.5 MG tablet Take 1 tablet (2.5 mg total) by mouth daily.  Marland Kitchen losartan (COZAAR) 100 MG tablet TAKE 1 TABLET BY MOUTH ONCE DAILY.  . magnesium oxide (MAGNESIUM-OXIDE) 400 (241.3 Mg) MG tablet Take 1 tablet (400 mg total) by mouth daily.  . meloxicam (MOBIC) 7.5 MG tablet Take 1 tablet (7.5 mg total) by mouth daily.  . metoprolol succinate (TOPROL-XL) 25 MG 24 hr tablet TAKE 1 TABLET BY MOUTH TWICE DAILY  . Multiple Vitamin (MULTIVITAMIN) tablet Take 1 tablet by mouth daily.  Marland Kitchen omeprazole (PRILOSEC) 20 MG capsule TAKE 1 CAPSULE BY MOUTH TWICE DAILY FOR REFLUX.  . pravastatin (PRAVACHOL) 20 MG tablet Take 1 tablet (20 mg total) by mouth daily.   No current facility-administered medications on file prior to visit.    ROS  Constitutional: Denies any fever or chills Gastrointestinal: No reported hemesis, hematochezia, vomiting, or acute GI distress Musculoskeletal:  Denies any acute onset joint swelling, redness, loss of ROM, or weakness Neurological: No reported episodes of acute onset apraxia, aphasia, dysarthria, agnosia, amnesia, paralysis, loss of coordination, or loss of consciousness  Allergies  Ms. Mahr is allergic to vesicare.  [place]  Medical:  Ms. Armand  has a past medical history of GERD (gastroesophageal reflux disease); Hiatal hernia; HTN (hypertension); Chronic cystitis; Urinary incontinence; Arthritis; Anemia; Breast cancer ([place]); Breast cancer ([place]); Parkinson disease ([place]); LBP (low back pain) (03/26/2014); Back pain (11/01/2013); Arm pain (07/26/2015); Neck pain (11/01/2013); Enthesopathy of hip (03/26/2014); Degeneration of intervertebral disc of lumbosacral region (03/26/2014); and Arthritis, degenerative (03/26/2014). Family: family history includes Colon polyps in her father; Diabetes in her father; Parkinson's disease in her mother; Stroke in her father and mother. Surgical:  has past surgical history that includes Breast biopsy (2013); Tonsillectomy and adenoidectomy (81); Vesicovaginal fistula closure w/ TAH (1983); Breast surgery (1986); Breast enhancement surgery (1987); Breast implant removal; Breast implant removal (Right, 08/29/2012); Mastectomy (08/2012); and Abdominal hysterectomy. Tobacco:  reports that she has never smoked. She has never used smokeless tobacco. Alcohol:  reports that she does not drink alcohol. Drug:  reports that she does not use illicit drugs.  Constitutional Exam  Vitals: Blood pressure 95/53, pulse 58, temperature 98.1 F (36.7 C), resp. rate 16, height '5\' 4"'$  (1.626 m), weight 179 lb (81.194 kg), last menstrual period 12/18/1981, SpO2 100 %. General appearance: Well nourished, well developed, and well hydrated. In no acute distress Calculated BMI/Body habitus: Body mass index is 30.71 kg/(m^2). (30-34.9 kg/m2) Obese (Class I) - 68% higher incidence of chronic pain Psych/Mental status: Alert and oriented x 3  (person, place, & time) Eyes: PERLA Respiratory: No evidence of acute respiratory distress  Cervical Spine Exam  Inspection: No masses, redness, or swelling Alignment: Symmetrical ROM: Functional: ROM is within functional limits [facility]) Stability: No instability detected Muscle strength & Tone: Functionally intact Sensory: Unimpaired Palpation: No complaints of tenderness  Upper Extremity (UE) Exam    Side: Right upper extremity  Side: Left upper extremity  Inspection: No masses, redness, swelling, or asymmetry  Inspection: No masses, redness, swelling, or asymmetry  ROM:  ROM:  Functional: ROM is within functional limits [facility])  Functional: ROM is within functional limits [facility])  Muscle strength & Tone: Functionally intact  Muscle strength & Tone: Functionally intact  Sensory: Unimpaired  Sensory: Unimpaired  Palpation: Non-contributory  Palpation: Non-contributory   Thoracic Spine Exam  Inspection: No masses, redness, or swelling Alignment: Symmetrical ROM: Functional: ROM is within functional limits [facility]) Stability: No instability detected Sensory: Unimpaired Muscle strength & Tone: Functionally intact Palpation: No complaints of tenderness  Lumbar Spine Exam  Inspection: No masses, redness, or swelling Alignment: Symmetrical ROM: Functional: ROM is within functional limits [facility]) Stability: No instability detected Muscle strength & Tone: Functionally intact Sensory: Unimpaired Palpation: No complaints of tenderness Provocative Tests: Lumbar Hyperextension and rotation test: deferred Patrick's Maneuver: deferred  Gait & Posture Assessment  Ambulation: Unassisted Gait: Unaffected Posture: WNL  Lower Extremity Exam    Side: Right lower extremity  Side: Left lower extremity  Inspection: No masses, redness, swelling, or asymmetry ROM:  Inspection: No masses, redness, swelling, or asymmetry ROM:  Functional: ROM is within functional limits [facility])  Functional: ROM is  within functional limits [facility])  Muscle strength & Tone: Functionally intact  Muscle strength & Tone: Functionally intact  Sensory: Unimpaired  Sensory: Unimpaired  Palpation: Non-contributory  Palpation: Non-contributory   Assessment & Plan  Primary Diagnosis & Pertinent Problem List: The primary encounter diagnosis was Cervical facet hypertrophy. Diagnoses of Cervical facet syndrome, Chronic cervical radicular pain (Right), Chronic hip pain, unspecified laterality, Chronic low back pain (Location of Primary Source of Pain) (Bilateral) (R>L), Lumbar facet syndrome (Location of Primary Source of Pain) (Bilateral) (R>L), Chronic pain of right upper extremity, Lumbar spondylosis, unspecified spinal osteoarthritis, Chronic neck pain, and Chronic shoulder pain, unspecified laterality were also pertinent to this visit.  Visit Diagnosis: 1. Cervical facet hypertrophy   2. Cervical facet syndrome   3. Chronic cervical radicular pain (Right)   4. Chronic hip pain, unspecified laterality   5. Chronic low back pain (Location of Primary Source of Pain) (Bilateral) (R>L)   6. Lumbar facet syndrome (Location of Primary Source of Pain) (Bilateral) (R>L)  7. Chronic pain of right upper extremity   8. Lumbar spondylosis, unspecified spinal osteoarthritis   9. Chronic neck pain   10. Chronic shoulder pain, unspecified laterality     Problems updated and reviewed during this visit: Problem  Cervical facet hypertrophy  Cervical Facet Syndrome  Chronic Shoulder Pain  Chronic Constipation  Chronic Kidney Disease (Ckd), Stage II ([place])  Hernia, Rectovaginal    Problem-specific Plan(s): No problem-specific assessment & plan notes found for this encounter.  No new assessment & plan notes have been filed under this hospital service since the last note was generated. Service: Pain Management   Plan of Care   Problem List Items Addressed This Visit      High   Cervical facet hypertrophy - Primary  (Chronic)   Relevant Orders   CERVICAL FACET (MEDIAL BRANCH NERVE BLOCK)    Cervical facet syndrome (Chronic)   Relevant Orders   CERVICAL FACET (MEDIAL BRANCH NERVE BLOCK)    Chronic cervical radicular pain (Right) (Chronic)   Relevant Orders   CERVICAL EPIDURAL STEROID INJECTION   Chronic hip pain (Chronic)   Relevant Orders   HIP INJECTION   Chronic low back pain (Location of Primary Source of Pain) (Bilateral) (R>L) (Chronic)   Relevant Orders   LUMBAR FACET(MEDIAL BRANCH NERVE BLOCK) MBNB   Radiofrequency,Lumbar   Chronic neck pain (Chronic)   Chronic shoulder pain (Chronic)   Relevant Orders   SHOULDER INJECTION   SUPRASCAPULAR NERVE BLOCK   TRIGGER POINT INJECTION   Chronic upper extremity pain (Right) (Chronic)   Lumbar facet syndrome (Location of Primary Source of Pain) (Bilateral) (R>L) (Chronic)   Relevant Orders   LUMBAR FACET(MEDIAL BRANCH NERVE BLOCK) MBNB   Radiofrequency,Lumbar   Lumbar spondylosis (Chronic)       Pharmacotherapy (Medications Ordered): No orders of the defined types were placed in this encounter.    Lab-work & Procedure Ordered: Orders Placed This Encounter  Procedures  . CERVICAL EPIDURAL STEROID INJECTION  . CERVICAL FACET (MEDIAL BRANCH NERVE BLOCK)   . LUMBAR FACET(MEDIAL BRANCH NERVE BLOCK) MBNB  . Radiofrequency,Lumbar  . HIP INJECTION  . SHOULDER INJECTION  . SUPRASCAPULAR NERVE BLOCK  . TRIGGER POINT INJECTION    Imaging Ordered: None  Interventional Therapies: Scheduled:  None at this time.    Considering:   1. Diagnostic right-sided lumbar facet block under fluoroscopic guidance and IV sedation.  2. Palliative right-sided lumbar facet radiofrequency ablation under fluoroscopic guidance and IV sedation.  3. Palliative Right suprascapular muscle trigger point injection, without fluoroscopy or IV sedation.  4. Diagnostic bilateral cervical facet block under fluoroscopic guidance and IV sedation.  5. Possible bilateral  cervical facet radiofrequency ablation under fluoroscopic guidance and IV sedation.  6. Diagnostic right-sided cervical epidural steroid injection under fluoroscopic guidance, with or without IV sedation.  7. Diagnostic intra-articular hip joint injection under fluoroscopic guidance, without IV sedation.  8. Possible hip joint radiofrequency ablation under fluoroscopic guidance and IV sedation.    PRN Procedures:   1. Diagnostic right-sided lumbar facet block under fluoroscopic guidance and IV sedation.  2. Palliative right-sided lumbar facet radiofrequency ablation under fluoroscopic guidance and IV sedation.  3. Palliative Right suprascapular muscle trigger point injection, without fluoroscopy or IV sedation.  4. Diagnostic bilateral cervical facet block under fluoroscopic guidance and IV sedation.  5. Diagnostic right-sided cervical epidural steroid injection under fluoroscopic guidance, with or without IV sedation.  6. Diagnostic intra-articular hip joint injection under fluoroscopic guidance, without IV sedation.    Referral(s)  or Consult(s): None at this time.  New Prescriptions   No medications on file    Medications administered during this visit: Ms. Mignone had no medications administered during this visit.  Requested PM Follow-up: Return if symptoms worsen or fail to improve, for Procedure (PRN - Patient will call).  Future Appointments Date Time Provider [place]  06/16/2016 10:30 AM [facility] None  06/21/2016 1:00 PM [facility]  08/02/2016 10:00 AM Milinda Pointer, MD [facility] None  09/26/2016 11:30 AM Lloyd Huger, MD [facility] None  10/02/2016 2:00 PM Einar Pheasant, MD [facility] None  05/02/2017 11:00 AM Noreene Filbert, MD [facility] None    Primary Care Physician: Einar Pheasant, MD Location: [facility] Note by: Kathlen Brunswick. Dossie Arbour, M.D, DABA, DABAPM, DABPM, DABIPP, FIPP  Pain Score  Disclaimer: We use the NRS-11 scale. This is a self-reported, subjective measurement of pain severity with only modest accuracy. It is used primarily to identify changes within a particular patient. It must be understood that outpatient pain scales are significantly less accurate that those used for research, where they can be applied under ideal controlled circumstances with minimal exposure to variables. In reality, the score is likely to be a combination of pain intensity and pain affect, where pain affect describes the degree of emotional arousal or changes in action readiness caused by the sensory experience of pain. Factors such as social and work situation, setting, emotional state, anxiety levels, expectation, and prior pain experience may influence pain perception and show large inter-individual differences that may also be affected by time variables.  Patient instructions provided during this appointment: There are no Patient Instructions on file for this visit.

## 2016-06-16 ENCOUNTER — Other Ambulatory Visit (INDEPENDENT_AMBULATORY_CARE_PROVIDER_SITE_OTHER): Payer: Medicare Other

## 2016-06-16 DIAGNOSIS — I10 Essential (primary) hypertension: Secondary | ICD-10-CM | POA: Diagnosis not present

## 2016-06-16 DIAGNOSIS — E7800 Pure hypercholesterolemia, unspecified: Secondary | ICD-10-CM

## 2016-06-16 LAB — HEPATIC FUNCTION PANEL
ALBUMIN: 4 g/dL (ref 3.5–5.2)
ALT: 7 U/L (ref 0–35)
AST: 24 U/L (ref 0–37)
Alkaline Phosphatase: 57 U/L (ref 39–117)
BILIRUBIN DIRECT: 0.1 mg/dL (ref 0.0–0.3)
TOTAL PROTEIN: 6.9 g/dL (ref 6.0–8.3)
Total Bilirubin: 0.4 mg/dL (ref 0.2–1.2)

## 2016-06-16 LAB — BASIC METABOLIC PANEL
BUN: 21 mg/dL (ref 6–23)
CO2: 32 mEq/L (ref 19–32)
CREATININE: 0.99 mg/dL (ref 0.40–1.20)
Calcium: 9.5 mg/dL (ref 8.4–10.5)
Chloride: 97 mEq/L (ref 96–112)
GFR: 57.56 mL/min — AB (ref >60.00)
Glucose, Bld: 87 mg/dL (ref 70–99)
POTASSIUM: 4 meq/L (ref 3.5–5.1)
Sodium: 133 mEq/L — ABNORMAL LOW (ref 135–145)

## 2016-06-16 LAB — CBC WITH DIFFERENTIAL/PLATELET
Basophils Absolute: 0 10*3/uL (ref 0.0–0.1)
Basophils Relative: 0.7 % (ref 0.0–3.0)
EOS ABS: 0.3 10*3/uL (ref 0.0–0.7)
EOS PCT: 6.6 % — AB (ref 0.0–5.0)
HCT: 35 % — ABNORMAL LOW (ref 36.0–46.0)
HEMOGLOBIN: 11.9 g/dL — AB (ref 12.0–15.0)
LYMPHS PCT: 28.2 % (ref 12.0–46.0)
Lymphs Abs: 1.3 10*3/uL (ref 0.7–4.0)
MCHC: 33.9 g/dL (ref 30.0–36.0)
MCV: 88.3 fl (ref 78.0–100.0)
MONO ABS: 0.4 10*3/uL (ref 0.1–1.0)
Monocytes Relative: 8.8 % (ref 3.0–12.0)
Neutro Abs: 2.6 10*3/uL (ref 1.4–7.7)
Neutrophils Relative %: 55.7 % (ref 43.0–77.0)
Platelets: 219 10*3/uL (ref 150.0–400.0)
RBC: 3.97 Mil/uL (ref 3.87–5.11)
RDW: 13.4 % (ref 11.5–15.5)
WBC: 4.7 10*3/uL (ref 4.0–10.5)

## 2016-06-16 LAB — LIPID PANEL
CHOLESTEROL: 141 mg/dL (ref 0–200)
HDL: 36.1 mg/dL — AB (ref >39.00)
LDL CALC: 70 mg/dL (ref 0–99)
NonHDL: 104.74
TRIGLYCERIDES: 174 mg/dL — AB (ref 0.0–149.0)
Total CHOL/HDL Ratio: 4
VLDL: 34.8 mg/dL (ref 0.0–40.0)

## 2016-06-18 ENCOUNTER — Other Ambulatory Visit: Payer: Self-pay | Admitting: Internal Medicine

## 2016-06-18 DIAGNOSIS — B351 Tinea unguium: Secondary | ICD-10-CM

## 2016-06-18 DIAGNOSIS — G20 Parkinson's disease: Secondary | ICD-10-CM

## 2016-06-18 DIAGNOSIS — D649 Anemia, unspecified: Secondary | ICD-10-CM

## 2016-06-18 DIAGNOSIS — M792 Neuralgia and neuritis, unspecified: Secondary | ICD-10-CM

## 2016-06-18 DIAGNOSIS — R251 Tremor, unspecified: Secondary | ICD-10-CM

## 2016-06-18 NOTE — Progress Notes (Signed)
Orders placed for f/u labs.  

## 2016-06-19 ENCOUNTER — Encounter: Payer: Self-pay | Admitting: *Deleted

## 2016-06-21 ENCOUNTER — Ambulatory Visit
Admission: RE | Admit: 2016-06-21 | Discharge: 2016-06-21 | Disposition: A | Discharge status: Home / Self Care | Payer: Medicare Other | Source: Ambulatory Visit | Attending: Oncology | Admitting: Oncology

## 2016-06-21 DIAGNOSIS — Z8371 Family history of colonic polyps: Secondary | ICD-10-CM | POA: Diagnosis not present

## 2016-06-21 DIAGNOSIS — Z13820 Encounter for screening for osteoporosis: Secondary | ICD-10-CM | POA: Diagnosis not present

## 2016-06-21 DIAGNOSIS — Z853 Personal history of malignant neoplasm of breast: Secondary | ICD-10-CM | POA: Diagnosis not present

## 2016-06-21 DIAGNOSIS — M8580 Other specified disorders of bone density and structure, unspecified site: Secondary | ICD-10-CM | POA: Insufficient documentation

## 2016-06-21 DIAGNOSIS — Z79811 Long term (current) use of aromatase inhibitors: Secondary | ICD-10-CM

## 2016-06-21 DIAGNOSIS — K5909 Other constipation: Secondary | ICD-10-CM | POA: Diagnosis not present

## 2016-06-21 DIAGNOSIS — M8588 Other specified disorders of bone density and structure, other site: Secondary | ICD-10-CM | POA: Diagnosis not present

## 2016-06-22 ENCOUNTER — Encounter: Payer: No Typology Code available for payment source | Admitting: Pain Medicine

## 2016-06-23 ENCOUNTER — Other Ambulatory Visit: Payer: Self-pay | Admitting: Pain Medicine

## 2016-06-26 ENCOUNTER — Encounter: Payer: No Typology Code available for payment source | Admitting: Pain Medicine

## 2016-07-17 ENCOUNTER — Other Ambulatory Visit: Payer: Medicare Other

## 2016-07-25 ENCOUNTER — Other Ambulatory Visit: Payer: Self-pay | Admitting: Internal Medicine

## 2016-07-27 ENCOUNTER — Other Ambulatory Visit (INDEPENDENT_AMBULATORY_CARE_PROVIDER_SITE_OTHER): Payer: Medicare Other

## 2016-07-27 DIAGNOSIS — D649 Anemia, unspecified: Secondary | ICD-10-CM

## 2016-07-27 LAB — CBC WITH DIFFERENTIAL/PLATELET
BASOS PCT: 0.6 % (ref 0.0–3.0)
Basophils Absolute: 0 10*3/uL (ref 0.0–0.1)
EOS PCT: 3.8 % (ref 0.0–5.0)
Eosinophils Absolute: 0.3 10*3/uL (ref 0.0–0.7)
HEMATOCRIT: 34 % — AB (ref 36.0–46.0)
HEMOGLOBIN: 11.6 g/dL — AB (ref 12.0–15.0)
LYMPHS PCT: 23.7 % (ref 12.0–46.0)
Lymphs Abs: 1.6 10*3/uL (ref 0.7–4.0)
MCHC: 34.1 g/dL (ref 30.0–36.0)
MCV: 87.6 fl (ref 78.0–100.0)
MONOS PCT: 8.3 % (ref 3.0–12.0)
Monocytes Absolute: 0.6 10*3/uL (ref 0.1–1.0)
NEUTROS ABS: 4.3 10*3/uL (ref 1.4–7.7)
Neutrophils Relative %: 63.6 % (ref 43.0–77.0)
PLATELETS: 259 10*3/uL (ref 150.0–400.0)
RBC: 3.88 Mil/uL (ref 3.87–5.11)
RDW: 13.6 % (ref 11.5–15.5)
WBC: 6.7 10*3/uL (ref 4.0–10.5)

## 2016-07-27 LAB — IBC PANEL
Iron: 80 ug/dL (ref 42–145)
SATURATION RATIOS: 22.8 % (ref 20.0–50.0)
TRANSFERRIN: 251 mg/dL (ref 212.0–360.0)

## 2016-07-27 LAB — FERRITIN: FERRITIN: 42.3 ng/mL (ref 10.0–291.0)

## 2016-08-02 ENCOUNTER — Ambulatory Visit: Payer: Medicare Other | Attending: Pain Medicine | Admitting: Pain Medicine

## 2016-08-02 ENCOUNTER — Encounter: Payer: Self-pay | Admitting: Pain Medicine

## 2016-08-02 VITALS — BP 147/78 | HR 70 | Temp 98.3°F | Resp 18 | Ht 64.0 in | Wt 180.0 lb

## 2016-08-02 DIAGNOSIS — N760 Acute vaginitis: Secondary | ICD-10-CM | POA: Insufficient documentation

## 2016-08-02 DIAGNOSIS — M792 Neuralgia and neuritis, unspecified: Secondary | ICD-10-CM

## 2016-08-02 DIAGNOSIS — R51 Headache: Secondary | ICD-10-CM | POA: Insufficient documentation

## 2016-08-02 DIAGNOSIS — F329 Major depressive disorder, single episode, unspecified: Secondary | ICD-10-CM | POA: Insufficient documentation

## 2016-08-02 DIAGNOSIS — R079 Chest pain, unspecified: Secondary | ICD-10-CM | POA: Insufficient documentation

## 2016-08-02 DIAGNOSIS — E7800 Pure hypercholesterolemia, unspecified: Secondary | ICD-10-CM | POA: Insufficient documentation

## 2016-08-02 DIAGNOSIS — M545 Low back pain: Secondary | ICD-10-CM | POA: Insufficient documentation

## 2016-08-02 DIAGNOSIS — M25551 Pain in right hip: Secondary | ICD-10-CM | POA: Diagnosis present

## 2016-08-02 DIAGNOSIS — B351 Tinea unguium: Secondary | ICD-10-CM | POA: Diagnosis not present

## 2016-08-02 DIAGNOSIS — E669 Obesity, unspecified: Secondary | ICD-10-CM | POA: Insufficient documentation

## 2016-08-02 DIAGNOSIS — M150 Primary generalized (osteo)arthritis: Secondary | ICD-10-CM

## 2016-08-02 DIAGNOSIS — K219 Gastro-esophageal reflux disease without esophagitis: Secondary | ICD-10-CM | POA: Diagnosis not present

## 2016-08-02 DIAGNOSIS — M797 Fibromyalgia: Secondary | ICD-10-CM | POA: Diagnosis not present

## 2016-08-02 DIAGNOSIS — M159 Polyosteoarthritis, unspecified: Secondary | ICD-10-CM

## 2016-08-02 DIAGNOSIS — N3946 Mixed incontinence: Secondary | ICD-10-CM | POA: Insufficient documentation

## 2016-08-02 DIAGNOSIS — E785 Hyperlipidemia, unspecified: Secondary | ICD-10-CM | POA: Insufficient documentation

## 2016-08-02 DIAGNOSIS — M25519 Pain in unspecified shoulder: Secondary | ICD-10-CM | POA: Diagnosis not present

## 2016-08-02 DIAGNOSIS — C50911 Malignant neoplasm of unspecified site of right female breast: Secondary | ICD-10-CM | POA: Insufficient documentation

## 2016-08-02 DIAGNOSIS — K5909 Other constipation: Secondary | ICD-10-CM | POA: Insufficient documentation

## 2016-08-02 DIAGNOSIS — N182 Chronic kidney disease, stage 2 (mild): Secondary | ICD-10-CM | POA: Diagnosis not present

## 2016-08-02 DIAGNOSIS — G8929 Other chronic pain: Secondary | ICD-10-CM | POA: Diagnosis not present

## 2016-08-02 DIAGNOSIS — Z6839 Body mass index (BMI) 39.0-39.9, adult: Secondary | ICD-10-CM | POA: Insufficient documentation

## 2016-08-02 DIAGNOSIS — Z79891 Long term (current) use of opiate analgesic: Secondary | ICD-10-CM | POA: Diagnosis not present

## 2016-08-02 DIAGNOSIS — M7918 Myalgia, other site: Secondary | ICD-10-CM

## 2016-08-02 DIAGNOSIS — I129 Hypertensive chronic kidney disease with stage 1 through stage 4 chronic kidney disease, or unspecified chronic kidney disease: Secondary | ICD-10-CM | POA: Insufficient documentation

## 2016-08-02 DIAGNOSIS — N8110 Cystocele, unspecified: Secondary | ICD-10-CM | POA: Insufficient documentation

## 2016-08-02 DIAGNOSIS — M47816 Spondylosis without myelopathy or radiculopathy, lumbar region: Secondary | ICD-10-CM

## 2016-08-02 DIAGNOSIS — M4722 Other spondylosis with radiculopathy, cervical region: Secondary | ICD-10-CM | POA: Insufficient documentation

## 2016-08-02 DIAGNOSIS — M791 Myalgia: Secondary | ICD-10-CM | POA: Diagnosis not present

## 2016-08-02 DIAGNOSIS — M1991 Primary osteoarthritis, unspecified site: Secondary | ICD-10-CM | POA: Insufficient documentation

## 2016-08-02 DIAGNOSIS — K449 Diaphragmatic hernia without obstruction or gangrene: Secondary | ICD-10-CM | POA: Diagnosis not present

## 2016-08-02 MED ORDER — HYDROCODONE-ACETAMINOPHEN 5-325 MG PO TABS: 1.0000 | ORAL_TABLET | Freq: Three times a day (TID) | ORAL | 0 refills | Status: DC | PRN

## 2016-08-02 MED ORDER — MELOXICAM 7.5 MG PO TABS: 7.5000 mg | ORAL_TABLET | Freq: Every day | ORAL | 2 refills | Status: DC

## 2016-08-02 MED ORDER — GABAPENTIN 300 MG PO CAPS: 300.0000 mg | ORAL_CAPSULE | Freq: Two times a day (BID) | ORAL | 2 refills | Status: DC

## 2016-08-02 MED ORDER — CYCLOBENZAPRINE HCL 10 MG PO TABS: 10.0000 mg | ORAL_TABLET | Freq: Every day | ORAL | 2 refills | Status: DC

## 2016-08-02 MED ORDER — MAGNESIUM OXIDE 400 (241.3 MG) MG PO TABS: 1.0000 | ORAL_TABLET | Freq: Every day | ORAL | 2 refills | Status: DC

## 2016-08-02 NOTE — Patient Instructions (Signed)
Facet Blocks Patient Information  Description: The facets are joints in the spine between the vertebrae.  Like any joints in the body, facets can become irritated and painful.  Arthritis can also effect the facets.  By injecting steroids and local anesthetic in and around these joints, we can temporarily block the nerve supply to them.  Steroids act directly on irritated nerves and tissues to reduce selling and inflammation which often leads to decreased pain.  Facet blocks may be done anywhere along the spine from the neck to the low back depending upon the location of your pain.   After numbing the skin with local anesthetic (like Novocaine), a small needle is passed onto the facet joints under x-ray guidance.  You may experience a sensation of pressure while this is being done.  The entire block usually lasts about 15-25 minutes.   Conditions which may be treated by facet blocks:   Low back/buttock pain  Neck/shoulder pain  Certain types of headaches  Preparation for the injection:  1. Do not eat any solid food or dairy products within 8 hours of your appointment. 2. You may drink clear liquid up to 3 hours before appointment.  Clear liquids include water, black coffee, juice or soda.  No milk or cream please. 3. You may take your regular medication, including pain medications, with a sip of water before your appointment.  Diabetics should hold regular insulin (if taken separately) and take 1/2 normal NPH dose the morning of the procedure.  Carry some sugar containing items with you to your appointment. 4. A driver must accompany you and be prepared to drive you home after your procedure. 5. Bring all your current medications with you. 6. An IV may be inserted and sedation may be given at the discretion of the physician. 7. A blood pressure cuff, EKG and other monitors will often be applied during the procedure.  Some patients may need to have extra oxygen administered for a short  period. 8. You will be asked to provide medical information, including your allergies and medications, prior to the procedure.  We must know immediately if you are taking blood thinners (like Coumadin/Warfarin) or if you are allergic to IV iodine contrast (dye).  We must know if you could possible be pregnant.  Possible side-effects:   Bleeding from needle site  Infection (rare, may require surgery)  Nerve injury (rare)  Numbness & tingling (temporary)  Difficulty urinating (rare, temporary)  Spinal headache (a headache worse with upright posture)  Light-headedness (temporary)  Pain at injection site (serveral days)  Decreased blood pressure (rare, temporary)  Weakness in arm/leg (temporary)  Pressure sensation in back/neck (temporary)   Call if you experience:   Fever/chills associated with headache or increased back/neck pain  Headache worsened by an upright position  New onset, weakness or numbness of an extremity below the injection site  Hives or difficulty breathing (go to the emergency room)  Inflammation or drainage at the injection site(s)  Severe back/neck pain greater than usual  New symptoms which are concerning to you  Please note:  Although the local anesthetic injected can often make your back or neck feel good for several hours after the injection, the pain will likely return. It takes 3-7 days for steroids to work.  You may not notice any pain relief for at least one week.  If effective, we will often do a series of 2-3 injections spaced 3-6 weeks apart to maximally decrease your pain.  After the initial   series, you may be a candidate for a more permanent nerve block of the facets.  If you have any questions, please call #[phone] [place] 8 HOURS PRIOR TO PROCEDURE BRING A DRIVER TAKE BLOOD PRESSURE MEDICATION THE MORNING OF PROCEDUER

## 2016-08-02 NOTE — Progress Notes (Signed)
Safety precautions to be maintained throughout the outpatient stay will include: orient to surroundings, keep bed in low position, maintain call bell within reach at all times, provide assistance with transfer out of bed and ambulation.  Bottle labeled hydrocodone/apap 5/325 mg # 17/90  Filled 06-01-16   States she is taking approximately 2-3 per day

## 2016-08-02 NOTE — Progress Notes (Signed)
Patient's Name: Wanda Martinez  Patient type: Established  MRN: [id number]  Service setting: Ambulatory outpatient  DOB: 06-24-1935  Location: [facility]  DOS: 08/02/2016  Primary Care Physician: Einar Pheasant, MD  Note by: Kathlen Brunswick. Dossie Arbour, M.D  Referring Physician: Einar Pheasant, MD  Specialty: Interventional Pain Management  Last Visit to Pain Management: 06/23/2016   Primary Reason(s) for Visit: Encounter for prescription drug management (Level of risk: moderate) CC: Back Pain (low) and Hip Pain (right)   HPI  [name] is a 81 y.o. year old, female patient, who returns today as an established patient. She has Hypercholesterolemia; Essential hypertension; DYSPNEA; CHEST PAIN; Frequent UTI; Cystocele; Breast cancer ([place]); Anemia; Tremor; Toenail fungus; Skin lesions; Headache; Obesity (BMI 30-39.9); Dysuria; Sinusitis; Health care maintenance; Parkinsons ([place]); Fatigue; Breast cancer, right ([place]); Chronic kidney disease (CKD), stage II (mild); Cystocele, midline; Clinical depression; Incomplete bladder emptying; Mixed incontinence; HLD (hyperlipidemia); Bladder infection, chronic; Parkinson's disease ([place]); Pure hypercholesterolemia; Hernia, rectovaginal; Long term current use of opiate analgesic; Long term prescription opiate use; Opiate use; Encounter for therapeutic drug level monitoring; Encounter for chronic pain management; Chronic low back pain (Location of Primary Source of Pain) (Bilateral) (R>L); Lumbar spondylosis; Chronic hip pain; Chronic neck pain; Cervical spondylosis; Chronic cervical radicular pain (Right); Chronic upper extremity pain (Right); Chronic pain; Osteoarthritis; Diffuse myofascial pain syndrome; Neurogenic pain; Chronic upper back pain (Right); Myofascial pain syndrome (Right) (cervicothoracic); Vaginitis; Lumbar facet syndrome (Location of Primary Source of Pain) (Bilateral) (R>L); Malignant neoplasm of right female breast ([place]); Chronic  constipation; Cervical facet hypertrophy; Cervical facet syndrome; and Chronic shoulder pain on her problem list.. Her primarily concern today is the Back Pain (low) and Hip Pain (right)   Pain Assessment: Self-Reported Pain Score: 5  Clinically the patient looks like a 2/10 Reported level is inconsistent with clinical obrservations Information on the proper use of the pain score provided to the patient today. Pain Type: Chronic pain Pain Location: Back Pain Orientation: Lower Pain Descriptors / Indicators: Aching, Dull, Constant Pain Frequency: Constant  The patient comes into the clinics today for pharmacological management of her chronic pain. I last saw this patient on 06/23/2016. The patient  reports that she does not use drugs. Her body mass index is 30.9 kg/m.  She is having some low back pain and would like to have a shot done before she goes on a trip that she has planned.  Date of Last Visit: 06/12/16 Service Provided on Last Visit: Med Refill  Controlled Substance Pharmacotherapy Assessment & REMS (Risk Evaluation and Mitigation Strategy)  Analgesic: Hydrocodone/APAP 5/325 one tablet every 8 hours (15 mg/day) MME/day: 15 mg/day. Pill Count: Bottle labeled hydrocodone/apap 5/325 mg # 17/90  Filled 06-01-16   States she is taking approximately 2-3 per day. Pharmacokinetics: Onset of action (Liberation/Absorption): Within expected pharmacological parameters Time to Peak effect (Distribution): Timing and results are as within normal expected parameters Duration of action (Metabolism/Excretion): Within normal limits for medication Pharmacodynamics: Analgesic Effect: More than 50% Activity Facilitation: Medication(s) allow patient to sit, stand, walk, and do the basic ADLs Perceived Effectiveness: Described as relatively effective, allowing for increase in activities of daily living (ADL) Side-effects or Adverse reactions: None reported Monitoring: [place] PMP: Online review of the past  52-month period conducted. Compliant with practice rules and regulations Last UDS on record: ToxAssure Select 13  Date Value Ref Range Status  03/27/2016 FINAL  Final    Comment:    ==================================================================== TOXASSURE SELECT 30 (MW) ====================================================================  Test                             Result       Flag       Units Drug Present and Declared for Prescription Verification   Hydrocodone                    644          EXPECTED   ng/mg creat   Norhydrocodone                 707          EXPECTED   ng/mg creat    Sources of hydrocodone include scheduled prescription    medications. Norhydrocodone is an expected metabolite of    hydrocodone. ==================================================================== Test                      Result    Flag   Units      Ref Range   Creatinine              68               mg/dL      >=20 ==================================================================== Declared Medications:  The flagging and interpretation on this report are based on the  following declared medications.  Unexpected results may arise from  inaccuracies in the declared medications.  **Note: The testing scope of this panel includes these medications:  Hydrocodone (Norco)  Hydrocodone (Vicodin)  **Note: The testing scope of this panel does not include following  reported medications:  Acetaminophen (Norco)  Acetaminophen (Vicodin)  Amlodipine (Norvasc)  Aspirin  Carbidopa (Sinemet)  Citalopram (Celexa)  Cyclobenzaprine (Flexeril)  Gabapentin (Neurontin)  Hydrochlorothiazide (Hydrodiuril)  Letrozole (Femara)  Levodopa (Sinemet)  Losartan (Cozaar)  Magnesium (Mag-Ox)  Meloxicam (Mobic)  Metoprolol (Toprol)  Multivitamin  Omeprazole (Prilosec)  Pravastatin (Pravachol)  Supplement (OsCal)  Trazodone  (Desyrel) ==================================================================== For clinical consultation, please call [phone]. ====================================================================    UDS interpretation: Compliant          Medication Assessment Form: Reviewed. Patient indicates being compliant with therapy Treatment compliance: Compliant Risk Assessment: Aberrant Behavior: None observed today Substance Use Disorder (SUD) Risk Level: No change since last visit Risk of opioid abuse or dependence: 0.7-3.0% with doses ? 36 MME/day and 6.1-26% with doses ? 120 MME/day. Opioid Risk Tool (ORT) Score:      Depression Scale Score: PHQ-2: PHQ-2 Total Score: 0       PHQ-9: PHQ-9 Total Score: 0        Pharmacologic Plan: No change in therapy, at this time  Laboratory Chemistry  Inflammation Markers No results found for: ESRSEDRATE, CRP  Renal Function Lab Results  Component Value Date   BUN 21 06/16/2016   CREATININE 0.99 06/16/2016   GFRAA >60 10/08/2014   GFRNONAA >60 10/08/2014    Hepatic Function Lab Results  Component Value Date   AST 24 06/16/2016   ALT 7 06/16/2016   ALBUMIN 4.0 06/16/2016    Electrolytes Lab Results  Component Value Date   NA 133 (L) 06/16/2016   K 4.0 06/16/2016   CL 97 06/16/2016   CALCIUM 9.5 06/16/2016   MG 1.8 04/21/2013    Pain Modulating Vitamins Lab Results  Component Value Date   VITAMINB12 881 02/01/2016    Coagulation Parameters Lab Results  Component Value Date   PLT 259.0 07/27/2016  Cardiovascular Lab Results  Component Value Date   HGB 11.6 (L) 07/27/2016   HCT 34.0 (L) 07/27/2016    Note: Lab results reviewed.  Recent Diagnostic Imaging  Dg Bone Density  Result Date: 06/21/2016 EXAM: DUAL X-RAY ABSORPTIOMETRY (DXA) FOR BONE MINERAL DENSITY IMPRESSION: Dear Dr. Christianne Borrow, Your patient [name] completed a BMD test on 06/21/2016 using the [place] (analysis version: 14.10)  manufactured by [employer]. The following summarizes the results of our evaluation. PATIENT BIOGRAPHICAL: Name: Leyani, Valvo Patient ID: [id number] Birth Date: [birth year] Height: 63.0 in. Gender: Female Exam Date: 06/21/2016 Weight: 183.0 lbs. Indications: Advanced Age, Breast CA, Caucasian, Family Hx of Osteoporosis, High Risk Meds, Hysterectomy, Osteoarthritis Fractures: Treatments: Calcium, Letrazole, Vit D ASSESSMENT: The BMD measured at Femur Neck Left is 0.712 g/cm2 with a T-score of -2.3. This patient is considered osteopenic according to [facility]) criteria. Lumbar spine was not utilized due to advanced degenerative. Site Region Measured Measured WHO Young Adult BMD Date       Age      Classification T-score DualFemur Neck Left 06/21/2016 78.6 Osteopenia -2.3 0.712 g/cm2 DualFemur Neck Left 06/15/2015 77.6 Osteopenia -1.8 0.788 g/cm2 DualFemur Neck Left 08/21/2013 75.8 Osteopenia -2.0 0.764 g/cm2 Left Forearm Radius 33% 06/21/2016 78.6 Osteopenia -1.9 0.712 g/cm2 Left Forearm Radius 33% 06/15/2015 77.6 Osteoporosis -2.7 0.638 g/cm2 Left Forearm Radius 33% 08/21/2013 75.8 Osteopenia -1.7 0.724 g/cm2 World Health Organization [facility]) criteria for post-menopausal, Caucasian Women: Normal:       T-score at or above -1 SD Osteopenia:   T-score between -1 and -2.5 SD Osteoporosis: T-score at or below -2.5 SD RECOMMENDATIONS: [place] recommends that FDA-approved medical therapies be considered in postmenopausal women and men age 19 or older with a: 1. Hip or vertebral (clinical or morphometric) fracture. 2. T-score of < -2.5 at the spine or hip. 3. Ten-year fracture probability by FRAX of 3% or greater for hip fracture or 20% or greater for major osteoporotic fracture. All treatment decisions require clinical judgment and consideration of individual patient factors, including patient preferences, co-morbidities, previous drug use, risk factors not captured in the FRAX  model (e.g. falls, vitamin D deficiency, increased bone turnover, interval significant decline in bone density) and possible under - or over-estimation of fracture risk by FRAX. All patients should ensure an adequate intake of dietary calcium (1200 mg/d) and vitamin D (800 IU daily) unless contraindicated. FOLLOW-UP: People with diagnosed cases of osteoporosis or at high risk for fracture should have regular bone mineral density tests. For patients eligible for Medicare, routine testing is allowed once every 2 years. The testing frequency can be increased to one year for patients who have rapidly progressing disease, those who are receiving or discontinuing medical therapy to restore bone mass, or have additional risk factors. I have reviewed this report, and agree with the above findings. [facility] Dear Dr. Christianne Borrow, Your patient Isabel Caprice completed a FRAX assessment on 06/21/2016 using the [place] (analysis version: 14.10) manufactured by [employer]. The following summarizes the results of our evaluation. PATIENT BIOGRAPHICAL: Name: Keonna, Froman Patient ID: [id number] Birth Date: Oct 01, 1935 Height:    63.0 in. Gender:     [age]    [age]       Weight:    183.0 lbs. Ethnicity:  White  Exam Date: 06/21/2016 FRAX* RESULTS:  (version: 3.5) 10-year Probability of Fracture1 Major Osteoporotic Fracture2 Hip Fracture 16.7% 5.3% Population: [place] (Caucasian) Risk Factors: None Based on Femur (Left) Neck BMD 1 -The 10-year probability of fracture may be lower than reported if the patient has received treatment. 2 -Major Osteoporotic Fracture: Clinical Spine, Forearm, Hip or Shoulder *FRAX is a Materials engineer of the [employer] for Metabolic Bone Disease, a [place] (WHO) [facility]. ASSESSMENT: The probability of a major osteoporotic fracture is 16.7% within the next ten years. The probability of a  hip fracture is 5.3% within the next ten years. Electronically Signed   By: Julian Hy M.D.   On: 06/21/2016 13:36    Meds  The patient has a current medication list which includes the following prescription(s): amlodipine, aspirin, calcium carbonate, carbidopa-levodopa, citalopram, gavilyte-n with flavor pack, hydrochlorothiazide, letrozole, losartan, lubiprostone, metoprolol succinate, multivitamin, omeprazole, pravastatin, ciprofloxacin, cyclobenzaprine, gabapentin, hydrocodone-acetaminophen, hydrocodone-acetaminophen, hydrocodone-acetaminophen, magnesium oxide, and meloxicam.  Current Outpatient Prescriptions on File Prior to Visit  Medication Sig  . amLODipine (NORVASC) 2.5 MG tablet TAKE 1 TABLET BY MOUTH ONCE DAILY.  Marland Kitchen aspirin 81 MG EC tablet Take 81 mg by mouth daily as needed.    . Calcium Carbonate (CALCIUM 600) 1500 MG TABS Take 1 tablet by mouth daily.    . carbidopa-levodopa (SINEMET IR) 25-100 MG per tablet Take 1 tablet by mouth 3 (three) times daily.  . citalopram (CELEXA) 20 MG tablet Take 20 mg by mouth daily.  . hydrochlorothiazide (HYDRODIURIL) 25 MG tablet TAKE 1 TABLET BY MOUTH ONCE DAILY.  Marland Kitchen letrozole (FEMARA) 2.5 MG tablet Take 1 tablet (2.5 mg total) by mouth daily.  Marland Kitchen losartan (COZAAR) 100 MG tablet TAKE 1 TABLET BY MOUTH ONCE DAILY.  . metoprolol succinate (TOPROL-XL) 25 MG 24 hr tablet TAKE 1 TABLET BY MOUTH TWICE DAILY  . Multiple Vitamin (MULTIVITAMIN) tablet Take 1 tablet by mouth daily.  Marland Kitchen omeprazole (PRILOSEC) 20 MG capsule TAKE 1 CAPSULE BY MOUTH TWICE DAILY FOR REFLUX.  . pravastatin (PRAVACHOL) 20 MG tablet Take 1 tablet (20 mg total) by mouth daily.   No current facility-administered medications on file prior to visit.     ROS  Constitutional: Denies any fever or chills Gastrointestinal: No reported hemesis, hematochezia, vomiting, or acute GI distress Musculoskeletal: Denies any acute onset joint swelling, redness, loss of ROM, or  weakness Neurological: No reported episodes of acute onset apraxia, aphasia, dysarthria, agnosia, amnesia, paralysis, loss of coordination, or loss of consciousness  Allergies  Ms. Rogacki is allergic to vesicare [solifenacin].  [place]  Medical:  Ms. Bloyer  has a past medical history of Anemia; Arm pain (07/26/2015); Arthritis; Arthritis, degenerative (03/26/2014); Back pain (11/01/2013); Breast cancer ([place]); Breast cancer ([place]); Chronic cystitis; Degeneration of intervertebral disc of lumbosacral region (03/26/2014); Enthesopathy of hip (03/26/2014); GERD (gastroesophageal reflux disease); Hiatal hernia; HTN (hypertension); LBP (low back pain) (03/26/2014); Neck pain (11/01/2013); Parkinson disease ([place]); and Urinary incontinence. Family: family history includes Colon polyps in her father; Diabetes in her father; Parkinson's disease in her mother; Stroke in her father and mother. Surgical:  has a past surgical history that includes Breast biopsy (2013); Tonsillectomy and adenoidectomy (79); Vesicovaginal fistula closure w/ TAH (1983); Breast surgery (1986); Breast enhancement surgery (1987); Breast implant removal; Breast implant removal (Right, 08/29/2012); Mastectomy (08/2012); and Abdominal hysterectomy. Tobacco:  reports that she has never smoked. She has never used smokeless tobacco. Alcohol:  reports that she does not drink alcohol. Drug:  reports that she does not use drugs.  Constitutional Exam  Vitals: Blood pressure (!) 147/78, pulse 70, temperature 98.3 F (36.8 C), resp. rate 18, height 5\' 4"  (1.626 m), weight 180 lb (81.6 kg), last menstrual period 12/18/1981, SpO2 98 %. General appearance: Well nourished, well developed, and well hydrated. In no acute distress Calculated BMI/Body habitus: Body mass index is 30.9 kg/m.       Psych/Mental status: Alert and oriented x 3 (person, place, & time) Eyes: PERLA Respiratory: No evidence of acute respiratory distress  Cervical Spine Exam  Inspection:  No masses, redness, or swelling Alignment: Symmetrical Functional ROM: ROM appears unrestricted Stability: No instability detected Muscle strength & Tone: Functionally intact Sensory: Unimpaired Palpation: Non-contributory  Upper Extremity (UE) Exam    Side: Right upper extremity  Side: Left upper extremity  Inspection: No masses, redness, swelling, or asymmetry  Inspection: No masses, redness, swelling, or asymmetry  Functional ROM: ROM appears unrestricted  Functional ROM: ROM appears unrestricted  Muscle strength & Tone: Functionally intact  Muscle strength & Tone: Functionally intact  Sensory: Unimpaired  Sensory: Unimpaired  Palpation: Non-contributory  Palpation: Non-contributory   Thoracic Spine Exam  Inspection: No masses, redness, or swelling Alignment: Symmetrical Functional ROM: ROM appears unrestricted Stability: No instability detected Sensory: Unimpaired Muscle strength & Tone: Functionally intact Palpation: Non-contributory  Lumbar Spine Exam  Inspection: No masses, redness, or swelling Alignment: Symmetrical Functional ROM: ROM appears unrestricted Stability: No instability detected Muscle strength & Tone: Functionally intact Sensory: Unimpaired Palpation: Non-contributory Provocative Tests: Lumbar Hyperextension and rotation test: evaluation deferred today       Patrick's Maneuver: evaluation deferred today              Gait & Posture Assessment  Ambulation: Unassisted Gait: Relatively normal for age and body habitus Posture: WNL   Lower Extremity Exam    Side: Right lower extremity  Side: Left lower extremity  Inspection: No masses, redness, swelling, or asymmetry  Inspection: No masses, redness, swelling, or asymmetry  Functional ROM: ROM appears unrestricted  Functional ROM: ROM appears unrestricted  Muscle strength & Tone: Functionally intact  Muscle strength & Tone: Functionally intact  Sensory: Unimpaired  Sensory: Unimpaired  Palpation:  Non-contributory  Palpation: Non-contributory    Assessment & Plan  Primary Diagnosis & Pertinent Problem List: The primary encounter diagnosis was Myofascial pain syndrome (Right) (cervicothoracic). Diagnoses of Chronic pain, Diffuse myofascial pain syndrome, Neurogenic pain, Primary osteoarthritis involving multiple joints, Chronic low back pain (Location of Primary Source of Pain) (Bilateral) (R>L), and Lumbar facet syndrome (Location of Primary Source of Pain) (Bilateral) (R>L) were also pertinent to this visit.  Visit Diagnosis: 1. Myofascial pain syndrome (Right) (cervicothoracic)   2. Chronic pain   3. Diffuse myofascial pain syndrome   4. Neurogenic pain   5. Primary osteoarthritis involving multiple joints   6. Chronic low back pain (Location of Primary Source of Pain) (Bilateral) (R>L)   7. Lumbar facet syndrome (Location of Primary Source of Pain) (Bilateral) (R>L)     Problems updated and reviewed during this visit: Problem  Obesity (Bmi 30-39.9)    Problem-specific Plan(s): No problem-specific Assessment & Plan notes found for this encounter.  No new Assessment & Plan notes have been filed under this hospital service since the last note was generated. Service: Pain Management   Plan of Care   Problem List Items Addressed This Visit      High   Chronic low back pain (Location of Primary Source of Pain) (Bilateral) (  R>L) (Chronic)   Relevant Medications   HYDROcodone-acetaminophen (NORCO/VICODIN) 5-325 MG tablet   HYDROcodone-acetaminophen (NORCO/VICODIN) 5-325 MG tablet   HYDROcodone-acetaminophen (NORCO/VICODIN) 5-325 MG tablet   cyclobenzaprine (FLEXERIL) 10 MG tablet   meloxicam (MOBIC) 7.5 MG tablet   Chronic pain (Chronic)   Relevant Medications   HYDROcodone-acetaminophen (NORCO/VICODIN) 5-325 MG tablet   HYDROcodone-acetaminophen (NORCO/VICODIN) 5-325 MG tablet   HYDROcodone-acetaminophen (NORCO/VICODIN) 5-325 MG tablet   magnesium oxide  (MAGNESIUM-OXIDE) 400 (241.3 Mg) MG tablet   cyclobenzaprine (FLEXERIL) 10 MG tablet   gabapentin (NEURONTIN) 300 MG capsule   meloxicam (MOBIC) 7.5 MG tablet   Diffuse myofascial pain syndrome (Chronic)   Relevant Medications   cyclobenzaprine (FLEXERIL) 10 MG tablet   meloxicam (MOBIC) 7.5 MG tablet   Lumbar facet syndrome (Location of Primary Source of Pain) (Bilateral) (R>L) (Chronic)   Relevant Medications   HYDROcodone-acetaminophen (NORCO/VICODIN) 5-325 MG tablet   HYDROcodone-acetaminophen (NORCO/VICODIN) 5-325 MG tablet   HYDROcodone-acetaminophen (NORCO/VICODIN) 5-325 MG tablet   cyclobenzaprine (FLEXERIL) 10 MG tablet   meloxicam (MOBIC) 7.5 MG tablet   Myofascial pain syndrome (Right) (cervicothoracic) - Primary (Chronic)   Relevant Medications   meloxicam (MOBIC) 7.5 MG tablet   Neurogenic pain (Chronic)   Relevant Medications   gabapentin (NEURONTIN) 300 MG capsule   Osteoarthritis (Chronic)   Relevant Medications   HYDROcodone-acetaminophen (NORCO/VICODIN) 5-325 MG tablet   HYDROcodone-acetaminophen (NORCO/VICODIN) 5-325 MG tablet   HYDROcodone-acetaminophen (NORCO/VICODIN) 5-325 MG tablet   cyclobenzaprine (FLEXERIL) 10 MG tablet   meloxicam (MOBIC) 7.5 MG tablet    Other Visit Diagnoses   None.      Pharmacotherapy (Medications Ordered): Meds ordered this encounter  Medications  . HYDROcodone-acetaminophen (NORCO/VICODIN) 5-325 MG tablet    Sig: Take 1 tablet by mouth every 8 (eight) hours as needed for severe pain.    Dispense:  90 tablet    Refill:  0    Do not place this medication, or any other prescription from our practice, on "Automatic Refill". Patient may have prescription filled one day early if pharmacy is closed on scheduled refill date. Do not fill until: 08/02/16 To last until: 09/01/16  . HYDROcodone-acetaminophen (NORCO/VICODIN) 5-325 MG tablet    Sig: Take 1 tablet by mouth every 8 (eight) hours as needed for severe pain.    Dispense:   90 tablet    Refill:  0    Do not place this medication, or any other prescription from our practice, on "Automatic Refill". Patient may have prescription filled one day early if pharmacy is closed on scheduled refill date. Do not fill until: 09/01/16 To last until: 10/01/16  . HYDROcodone-acetaminophen (NORCO/VICODIN) 5-325 MG tablet    Sig: Take 1 tablet by mouth every 8 (eight) hours as needed for severe pain.    Dispense:  90 tablet    Refill:  0    Do not place this medication, or any other prescription from our practice, on "Automatic Refill". Patient may have prescription filled one day early if pharmacy is closed on scheduled refill date. Do not fill until: 10/01/16 To last until: 10/31/16  . magnesium oxide (MAGNESIUM-OXIDE) 400 (241.3 Mg) MG tablet    Sig: Take 1 tablet (400 mg total) by mouth daily.    Dispense:  30 tablet    Refill:  2    Do not place this medication, or any other prescription from our practice, on "Automatic Refill". Patient may have prescription filled one day early if pharmacy is closed on scheduled refill date.  Marland Kitchen  cyclobenzaprine (FLEXERIL) 10 MG tablet    Sig: Take 1 tablet (10 mg total) by mouth at bedtime.    Dispense:  30 tablet    Refill:  2    Do not place this medication, or any other prescription from our practice, on "Automatic Refill". Patient may have prescription filled one day early if pharmacy is closed on scheduled refill date.  . gabapentin (NEURONTIN) 300 MG capsule    Sig: Take 1 capsule (300 mg total) by mouth 2 (two) times daily.    Dispense:  60 capsule    Refill:  2    Do not place this medication, or any other prescription from our practice, on "Automatic Refill". Patient may have prescription filled one day early if pharmacy is closed on scheduled refill date.  . meloxicam (MOBIC) 7.5 MG tablet    Sig: Take 1 tablet (7.5 mg total) by mouth daily.    Dispense:  30 tablet    Refill:  2    Do not place this medication, or any other  prescription from our practice, on "Automatic Refill". Patient may have prescription filled one day early if pharmacy is closed on scheduled refill date.    Lab-work & Procedure Ordered: No orders of the defined types were placed in this encounter.   Imaging Ordered: None  Interventional Therapies: Scheduled:  Palliative right lumbar facet block under fluoroscopy and sedation.   Considering:   1. Diagnostic right-sided lumbar facet block under fluoroscopic guidance and IV sedation.  2. Palliative right-sided lumbar facet radiofrequency ablation under fluoroscopic guidance and IV sedation.  3. Palliative Right suprascapular muscle trigger point injection, without fluoroscopy or IV sedation.  4. Diagnostic bilateral cervical facet block under fluoroscopic guidance and IV sedation.  5. Possible bilateral cervical facet radiofrequency ablation under fluoroscopic guidance and IV sedation.  6. Diagnostic right-sided cervical epidural steroid injection under fluoroscopic guidance, with or without IV sedation.  7. Diagnostic intra-articular hip joint injection under fluoroscopic guidance, without IV sedation.  8. Possible hip joint radiofrequency ablation under fluoroscopic guidance and IV sedation.    PRN Procedures:   1. Diagnostic right-sided lumbar facet block under fluoroscopic guidance and IV sedation.  2. Palliative right-sided lumbar facet radiofrequency ablation under fluoroscopic guidance and IV sedation.  3. Palliative Right suprascapular muscle trigger point injection, without fluoroscopy or IV sedation.  4. Diagnostic bilateral cervical facet block under fluoroscopic guidance and IV sedation.  5. Diagnostic right-sided cervical epidural steroid injection under fluoroscopic guidance, with or without IV sedation.  6. Diagnostic intra-articular hip joint injection under fluoroscopic guidance, without IV sedation.    Referral(s) or Consult(s): None at this time.  New  Prescriptions   HYDROCODONE-ACETAMINOPHEN (NORCO/VICODIN) 5-325 MG TABLET    Take 1 tablet by mouth every 8 (eight) hours as needed for severe pain.   HYDROCODONE-ACETAMINOPHEN (NORCO/VICODIN) 5-325 MG TABLET    Take 1 tablet by mouth every 8 (eight) hours as needed for severe pain.    Medications administered during this visit: Ms. Failing had no medications administered during this visit.  Requested PM Follow-up: Return in 2 months (on 10/16/2016) for Med-Mgmt, In addition, Schedule Procedure, (ASAP).  Future Appointments Date Time Provider [place]  08/17/2016 1:00 PM Milinda Pointer, MD [facility] None  09/26/2016 11:30 AM Lloyd Huger, MD [facility] None  10/02/2016 2:00 PM Einar Pheasant, MD [facility] None  10/18/2016 10:00 AM Milinda Pointer, MD [facility] None  05/02/2017 11:00 AM Noreene Filbert, MD [facility] None    Primary Care Physician:  Einar Pheasant, MD Location: [facility] Note by: Kathlen Brunswick Dossie Arbour, M.D, DABA, DABAPM, DABPM, DABIPP, FIPP  Pain Score Disclaimer: We use the NRS-11 scale. This is a self-reported, subjective measurement of pain severity with only modest accuracy. It is used primarily to identify changes within a particular patient. It must be understood that outpatient pain scales are significantly less accurate that those used for research, where they can be applied under ideal controlled circumstances with minimal exposure to variables. In reality, the score is likely to be a combination of pain intensity and pain affect, where pain affect describes the degree of emotional arousal or changes in action readiness caused by the sensory experience of pain. Factors such as social and work situation, setting, emotional state, anxiety levels, expectation, and prior pain experience may influence pain perception and show large inter-individual differences that may also be affected by time variables.  Patient  instructions provided during this appointment: Patient Instructions  Facet Blocks Patient Information  Description: The facets are joints in the spine between the vertebrae.  Like any joints in the body, facets can become irritated and painful.  Arthritis can also effect the facets.  By injecting steroids and local anesthetic in and around these joints, we can temporarily block the nerve supply to them.  Steroids act directly on irritated nerves and tissues to reduce selling and inflammation which often leads to decreased pain.  Facet blocks may be done anywhere along the spine from the neck to the low back depending upon the location of your pain.   After numbing the skin with local anesthetic (like Novocaine), a small needle is passed onto the facet joints under x-ray guidance.  You may experience a sensation of pressure while this is being done.  The entire block usually lasts about 15-25 minutes.   Conditions which may be treated by facet blocks:   Low back/buttock pain  Neck/shoulder pain  Certain types of headaches  Preparation for the injection:  1. Do not eat any solid food or dairy products within 8 hours of your appointment. 2. You may drink clear liquid up to 3 hours before appointment.  Clear liquids include water, black coffee, juice or soda.  No milk or cream please. 3. You may take your regular medication, including pain medications, with a sip of water before your appointment.  Diabetics should hold regular insulin (if taken separately) and take 1/2 normal NPH dose the morning of the procedure.  Carry some sugar containing items with you to your appointment. 4. A driver must accompany you and be prepared to drive you home after your procedure. 5. Bring all your current medications with you. 6. An IV may be inserted and sedation may be given at the discretion of the physician. 7. A blood pressure cuff, EKG and other monitors will often be applied during the procedure.  Some  patients may need to have extra oxygen administered for a short period. 8. You will be asked to provide medical information, including your allergies and medications, prior to the procedure.  We must know immediately if you are taking blood thinners (like Coumadin/Warfarin) or if you are allergic to IV iodine contrast (dye).  We must know if you could possible be pregnant.  Possible side-effects:   Bleeding from needle site  Infection (rare, may require surgery)  Nerve injury (rare)  Numbness & tingling (temporary)  Difficulty urinating (rare, temporary)  Spinal headache (a headache worse with upright posture)  Light-headedness (temporary)  Pain  at injection site (serveral days)  Decreased blood pressure (rare, temporary)  Weakness in arm/leg (temporary)  Pressure sensation in back/neck (temporary)   Call if you experience:   Fever/chills associated with headache or increased back/neck pain  Headache worsened by an upright position  New onset, weakness or numbness of an extremity below the injection site  Hives or difficulty breathing (go to the emergency room)  Inflammation or drainage at the injection site(s)  Severe back/neck pain greater than usual  New symptoms which are concerning to you  Please note:  Although the local anesthetic injected can often make your back or neck feel good for several hours after the injection, the pain will likely return. It takes 3-7 days for steroids to work.  You may not notice any pain relief for at least one week.  If effective, we will often do a series of 2-3 injections spaced 3-6 weeks apart to maximally decrease your pain.  After the initial series, you may be a candidate for a more permanent nerve block of the facets.  If you have any questions, please call #[phone]) [place] 8 HOURS PRIOR TO PROCEDURE BRING A DRIVER TAKE BLOOD PRESSURE MEDICATION THE  MORNING OF PROCEDUER

## 2016-08-17 ENCOUNTER — Ambulatory Visit: Payer: Medicare Other | Attending: Pain Medicine | Admitting: Pain Medicine

## 2016-08-17 ENCOUNTER — Encounter: Payer: Self-pay | Admitting: Pain Medicine

## 2016-08-17 VITALS — BP 149/67 | HR 62 | Temp 98.7°F | Resp 15 | Ht 64.0 in | Wt 180.0 lb

## 2016-08-17 DIAGNOSIS — G20 Parkinson's disease: Secondary | ICD-10-CM | POA: Diagnosis not present

## 2016-08-17 DIAGNOSIS — R079 Chest pain, unspecified: Secondary | ICD-10-CM | POA: Insufficient documentation

## 2016-08-17 DIAGNOSIS — N760 Acute vaginitis: Secondary | ICD-10-CM | POA: Insufficient documentation

## 2016-08-17 DIAGNOSIS — N3946 Mixed incontinence: Secondary | ICD-10-CM | POA: Insufficient documentation

## 2016-08-17 DIAGNOSIS — D649 Anemia, unspecified: Secondary | ICD-10-CM | POA: Diagnosis not present

## 2016-08-17 DIAGNOSIS — M542 Cervicalgia: Secondary | ICD-10-CM | POA: Insufficient documentation

## 2016-08-17 DIAGNOSIS — G8929 Other chronic pain: Secondary | ICD-10-CM | POA: Insufficient documentation

## 2016-08-17 DIAGNOSIS — Z79891 Long term (current) use of opiate analgesic: Secondary | ICD-10-CM | POA: Insufficient documentation

## 2016-08-17 DIAGNOSIS — R51 Headache: Secondary | ICD-10-CM | POA: Diagnosis not present

## 2016-08-17 DIAGNOSIS — N3090 Cystitis, unspecified without hematuria: Secondary | ICD-10-CM | POA: Insufficient documentation

## 2016-08-17 DIAGNOSIS — M47816 Spondylosis without myelopathy or radiculopathy, lumbar region: Secondary | ICD-10-CM | POA: Diagnosis not present

## 2016-08-17 DIAGNOSIS — F329 Major depressive disorder, single episode, unspecified: Secondary | ICD-10-CM | POA: Insufficient documentation

## 2016-08-17 DIAGNOSIS — C50911 Malignant neoplasm of unspecified site of right female breast: Secondary | ICD-10-CM | POA: Insufficient documentation

## 2016-08-17 DIAGNOSIS — Z87440 Personal history of urinary (tract) infections: Secondary | ICD-10-CM | POA: Diagnosis not present

## 2016-08-17 DIAGNOSIS — M4722 Other spondylosis with radiculopathy, cervical region: Secondary | ICD-10-CM | POA: Insufficient documentation

## 2016-08-17 DIAGNOSIS — R251 Tremor, unspecified: Secondary | ICD-10-CM | POA: Insufficient documentation

## 2016-08-17 DIAGNOSIS — E669 Obesity, unspecified: Secondary | ICD-10-CM | POA: Diagnosis not present

## 2016-08-17 DIAGNOSIS — I129 Hypertensive chronic kidney disease with stage 1 through stage 4 chronic kidney disease, or unspecified chronic kidney disease: Secondary | ICD-10-CM | POA: Insufficient documentation

## 2016-08-17 DIAGNOSIS — L989 Disorder of the skin and subcutaneous tissue, unspecified: Secondary | ICD-10-CM | POA: Diagnosis not present

## 2016-08-17 DIAGNOSIS — Z6839 Body mass index (BMI) 39.0-39.9, adult: Secondary | ICD-10-CM | POA: Insufficient documentation

## 2016-08-17 DIAGNOSIS — B351 Tinea unguium: Secondary | ICD-10-CM | POA: Insufficient documentation

## 2016-08-17 DIAGNOSIS — N8110 Cystocele, unspecified: Secondary | ICD-10-CM | POA: Insufficient documentation

## 2016-08-17 DIAGNOSIS — E7800 Pure hypercholesterolemia, unspecified: Secondary | ICD-10-CM | POA: Diagnosis not present

## 2016-08-17 DIAGNOSIS — I10 Essential (primary) hypertension: Secondary | ICD-10-CM | POA: Insufficient documentation

## 2016-08-17 DIAGNOSIS — M545 Low back pain: Secondary | ICD-10-CM | POA: Insufficient documentation

## 2016-08-17 DIAGNOSIS — M25519 Pain in unspecified shoulder: Secondary | ICD-10-CM | POA: Insufficient documentation

## 2016-08-17 DIAGNOSIS — E785 Hyperlipidemia, unspecified: Secondary | ICD-10-CM | POA: Diagnosis not present

## 2016-08-17 DIAGNOSIS — N816 Rectocele: Secondary | ICD-10-CM | POA: Insufficient documentation

## 2016-08-17 DIAGNOSIS — M546 Pain in thoracic spine: Secondary | ICD-10-CM | POA: Insufficient documentation

## 2016-08-17 DIAGNOSIS — R0600 Dyspnea, unspecified: Secondary | ICD-10-CM | POA: Diagnosis not present

## 2016-08-17 MED ORDER — TRIAMCINOLONE ACETONIDE 40 MG/ML IJ SUSP
40.0000 mg | Freq: Once | INTRAMUSCULAR | Status: AC
  Administered 2016-08-17: 40 mg
  Filled 2016-08-17: qty 1

## 2016-08-17 MED ORDER — ROPIVACAINE HCL 2 MG/ML IJ SOLN
9.0000 mL | Freq: Once | INTRAMUSCULAR | Status: AC
  Administered 2016-08-17: 9 mL
  Filled 2016-08-17: qty 10

## 2016-08-17 MED ORDER — LACTATED RINGERS IV SOLN
1000.0000 mL | Freq: Once | INTRAVENOUS | Status: AC
  Administered 2016-08-17: 1000 mL via INTRAVENOUS

## 2016-08-17 MED ORDER — FENTANYL CITRATE (PF) 100 MCG/2ML IJ SOLN
25.0000 ug | INTRAMUSCULAR | Status: DC | PRN
  Filled 2016-08-17: qty 2

## 2016-08-17 MED ORDER — MIDAZOLAM HCL 5 MG/5ML IJ SOLN
1.0000 mg | INTRAMUSCULAR | Status: DC | PRN
  Filled 2016-08-17: qty 5

## 2016-08-17 MED ORDER — LIDOCAINE HCL (PF) 1 % IJ SOLN
10.0000 mL | Freq: Once | INTRAMUSCULAR | Status: AC
Start: 2016-08-17 — End: 2016-08-17
  Administered 2016-08-17: 10 mL
  Filled 2016-08-17: qty 10

## 2016-08-17 NOTE — Patient Instructions (Signed)
Pain Management Discharge Instructions  General Discharge Instructions :  If you need to reach your doctor call: Monday-Friday 8:00 am - 4:00 pm at [phone] or toll free [phone].  After clinic hours [phone] to have operator reach doctor.  Bring all of your medication bottles to all your appointments in the pain clinic.  To cancel or reschedule your appointment with Pain Management please remember to call 24 hours in advance to avoid a fee.  Refer to the educational materials which you have been given on: General Risks, I had my Procedure. Discharge Instructions, Post Sedation.  Post Procedure Instructions:  The drugs you were given will stay in your system until tomorrow, so for the next 24 hours you should not drive, make any legal decisions or drink any alcoholic beverages.  You may eat anything you prefer, but it is better to start with liquids then soups and crackers, and gradually work up to solid foods.  Please notify your doctor immediately if you have any unusual bleeding, trouble breathing or pain that is not related to your normal pain.  Depending on the type of procedure that was done, some parts of your body may feel week and/or numb.  This usually clears up by tonight or the next day.  Walk with the use of an assistive device or accompanied by an adult for the 24 hours.  You may use ice on the affected area for the first 24 hours.  Put ice in a Ziploc bag and cover with a towel and place against area 15 minutes on 15 minutes off.  You may switch to heat after 24 hours.Facet Joint Block The facet joints connect the bones of the spine (vertebrae). They make it possible for you to bend, twist, and make other movements with your spine. They also prevent you from overbending, overtwisting, and making other excessive movements.  A facet joint block is a procedure where a numbing medicine (anesthetic) is injected into a facet joint. Often, a type of anti-inflammatory  medicine called a steroid is also injected. A facet joint block may be done for two reasons:   Diagnosis. A facet joint block may be done as a test to see whether neck or back pain is caused by a worn-down or infected facet joint. If the pain gets better after a facet joint block, it means the pain is probably coming from the facet joint. If the pain does not get better, it means the pain is probably not coming from the facet joint.   Therapy. A facet joint block may be done to relieve neck or back pain caused by a facet joint. A facet joint block is only done as a therapy if the pain does not improve with medicine, exercise programs, physical therapy, and other forms of pain management. LET YOUR HEALTH CARE PROVIDER KNOW ABOUT:   Any allergies you have.   All medicines you are taking, including vitamins, herbs, eyedrops, and over-the-counter medicines and creams.   Previous problems you or members of your family have had with the use of anesthetics.   Any blood disorders you have had.   Other health problems you have. RISKS AND COMPLICATIONS Generally, having a facet joint block is safe. However, as with any procedure, complications can occur. Possible complications associated with having a facet joint block include:   Bleeding.   Injury to a nerve near the injection site.   Pain at the injection site.   Weakness or numbness in areas controlled by nerves near   the injection site.   Infection.   Temporary fluid retention.   Allergic reaction to anesthetics or medicines used during the procedure. BEFORE THE PROCEDURE   Follow your health care provider's instructions if you are taking dietary supplements or medicines. You may need to stop taking them or reduce your dosage.   Do not take any new dietary supplements or medicines without asking your health care provider first.   Follow your health care provider's instructions about eating and drinking before the  procedure. You may need to stop eating and drinking several hours before the procedure.   Arrange to have an adult drive you home after the procedure. PROCEDURE  You may need to remove your clothing and dress in an open-back gown so that your health care provider can access your spine.   The procedure will be done while you are lying on an X-ray table. Most of the time you will be asked to lie on your stomach, but you may be asked to lie in a different position if an injection will be made in your neck.   Special machines will be used to monitor your oxygen levels, heart rate, and blood pressure.   If an injection will be made in your neck, an intravenous (IV) tube will be inserted into one of your veins. Fluids and medicine will flow directly into your body through the IV tube.   The area over the facet joint where the injection will be made will be cleaned with an antiseptic soap. The surrounding skin will be covered with sterile drapes.   An anesthetic will be applied to your skin to make the injection area numb. You may feel a temporary stinging or burning sensation.   A video X-ray machine will be used to locate the joint. A contrast dye may be injected into the facet joint area to help with locating the joint.   When the joint is located, an anesthetic medicine will be injected into the joint through the needle.   Your health care provider will ask you whether you feel pain relief. If you do feel relief, a steroid may be injected to provide pain relief for a longer period of time. If you do not feel relief or feel only partial relief, additional injections of an anesthetic may be made in other facet joints.   The needle will be removed, the skin will be cleansed, and bandages will be applied.  AFTER THE PROCEDURE   You will be observed for 15-30 minutes before being allowed to go home. Do not drive. Have an adult drive you or take a taxi or public transportation instead.    If you feel pain relief, the pain will return in several hours or days when the anesthetic wears off.   You may feel pain relief 2-14 days after the procedure. The amount of time this relief lasts varies from person to person.   It is normal to feel some tenderness over the injected area(s) for 2 days following the procedure.   If you have diabetes, you may have a temporary increase in blood sugar.   This information is not intended to replace advice given to you by your health care provider. Make sure you discuss any questions you have with your health care provider.   Document Released: 04/25/2007 Document Revised: 12/25/2014 Document Reviewed: 09/23/2012 [employer] Interactive Patient Education 2016 [employer]  

## 2016-08-17 NOTE — Progress Notes (Signed)
Patient's Name: Wanda Martinez  Patient type: Established  MRN: [id number]  Service setting: Ambulatory outpatient  DOB: 04-Feb-1935  Location: [facility]  DOS: 08/17/2016  Primary Care Physician: Einar Pheasant, MD  Note by: Kathlen Brunswick. Dossie Arbour, MD  Referring Physician: Milinda Pointer, MD  Specialty: Interventional Pain Management  Last Visit to Pain Management: 08/02/2016   Primary Reason(s) for Visit: Interventional Pain Management Treatment. CC: Back Pain (lower, right)   Procedure:  Anesthesia, Analgesia, Anxiolysis:  Type: Diagnostic Medial Branch Facet Block Region: Lumbar Level: L2, L3, L4, L5, & S1 Medial Branch Level(s) Laterality: Right    Type: Moderate (Conscious) Sedation & Local Anesthesia Local Anesthetic: Lidocaine 1% Route: Intravenous (IV) IV Access: Secured Sedation: Meaningful verbal contact was maintained at all times during the procedure  Indication(s): Analgesia & Anxiolysis   Indications: 1. Lumbar facet syndrome (Location of Primary Source of Pain) (Bilateral) (R>L)   2. Chronic low back pain (Location of Primary Source of Pain) (Bilateral) (R>L)   3. Lumbar spondylosis, unspecified spinal osteoarthritis     Pre-procedure Pain Score: 6/10 Post-procedure Pain Score: Asleep/10  Pre-Procedure Assessment:  Ms. Nicholls is a 82 y.o. year old, female patient, seen today for interventional treatment. She has Hypercholesterolemia; Essential hypertension; DYSPNEA; CHEST PAIN; Frequent UTI; Cystocele; Breast cancer ([place]); Anemia; Tremor; Toenail fungus; Skin lesions; Headache; Obesity (BMI 30-39.9); Dysuria; Sinusitis; Health care maintenance; Parkinsons ([place]); Fatigue; Breast cancer, right ([place]); Chronic kidney disease (CKD), stage II (mild); Cystocele, midline; Clinical depression; Incomplete bladder emptying; Mixed incontinence; HLD (hyperlipidemia); Bladder infection, chronic; Parkinson's disease ([place]); Pure  hypercholesterolemia; Hernia, rectovaginal; Long term current use of opiate analgesic; Long term prescription opiate use; Opiate use; Encounter for therapeutic drug level monitoring; Encounter for chronic pain management; Chronic low back pain (Location of Primary Source of Pain) (Bilateral) (R>L); Lumbar spondylosis; Chronic hip pain; Chronic neck pain; Cervical spondylosis; Chronic cervical radicular pain (Right); Chronic upper extremity pain (Right); Chronic pain; Osteoarthritis; Diffuse myofascial pain syndrome; Neurogenic pain; Chronic upper back pain (Right); Myofascial pain syndrome (Right) (cervicothoracic); Vaginitis; Lumbar facet syndrome (Location of Primary Source of Pain) (Bilateral) (R>L); Malignant neoplasm of right female breast ([place]); Chronic constipation; Cervical facet hypertrophy; Cervical facet syndrome; and Chronic shoulder pain on her problem list.. Her primarily concern today is the Back Pain (lower, right)   Pain Type: Chronic pain Pain Location: Back Pain Orientation: Lower Pain Descriptors / Indicators: Dull, Aching Pain Frequency: Constant  Date of Last Visit: 06/12/16 Service Provided on Last Visit: Med Refill  Coagulation Parameters Lab Results  Component Value Date   PLT 259.0 07/27/2016    Verification of the correct person, correct site (including marking of site), and correct procedure were performed and confirmed by the patient.  Consent: Secured. Under the influence of no sedatives a written informed consent was obtained, after having provided information on the risks and possible complications. To fulfill our ethical and legal obligations, as recommended by the American Medical Association's Code of Ethics, we have provided information to the patient about our clinical impression; the nature and purpose of the treatment or procedure; the risks, benefits, and possible complications of the intervention; alternatives; the risk(s) and benefit(s) of the  alternative treatment(s) or procedure(s); and the risk(s) and benefit(s) of doing nothing. The patient was provided information about the risks and possible complications associated with the procedure. These include, but are not limited to, failure to achieve desired goals, infection, bleeding, organ or nerve damage, allergic reactions, paralysis, and death. In the  case of spinal procedures these may include, but are not limited to, failure to achieve desired goals, infection, bleeding, organ or nerve damage, allergic reactions, paralysis, and death. In addition, the patient was informed that Medicine is not an exact science; therefore, there is also the possibility of unforeseen risks and possible complications that may result in a catastrophic outcome. The patient indicated having understood very clearly. We have given the patient no guarantees and we have made no promises. Enough time was given to the patient to ask questions, all of which were answered to the patient's satisfaction.  Consent Attestation: I, the ordering provider, attest that I have discussed with the patient the benefits, risks, side-effects, alternatives, likelihood of achieving goals, and potential problems during recovery for the procedure that I have provided informed consent.  Pre-Procedure Preparation: Safety Precautions: Allergies reviewed. Appropriate site, procedure, and patient were confirmed by following the Joint Commission's Universal Protocol (UP.01.01.01), in the form of a "Time Out". The patient was asked to confirm marked site and procedure, before commencing. The patient was asked about blood thinners, or active infections, both of which were denied. Patient was assessed for positional comfort and all pressure points were checked before starting procedure. Infection Control Precautions: Sterile technique used. Standard Universal Precautions were taken as recommended by the Department of [place] [facility] [facility] for Disease  Control and Prevention (CDC). Standard pre-surgical skin prep was conducted. Respiratory hygiene and cough etiquette was practiced. Hand hygiene observed. Safe injection practices and needle disposal techniques followed. SDV (single dose vial) medications used. Medications properly checked for expiration dates and contaminants. Personal protective equipment (PPE) used: Surgical mask. Sterile Radiation-resistant gloves. Monitoring:  As per clinic protocol. Vitals:   08/17/16 1505 08/17/16 1510 08/17/16 1523 08/17/16 1533  BP: (!) 141/86 138/84 (!) 145/69 (!) 149/67  Pulse: 70 68 65 62  Resp: '12 13 13 15  '$ Temp:   98.7 F (37.1 C)   TempSrc:      SpO2: 96% 96% 92% 96%  Weight:      Height:      Calculated BMI: Body mass index is 30.9 kg/m. Allergies: She is allergic to vesicare [solifenacin].. Allergy Precautions: None required  Description of Procedure Process:   Time-out: "Time-out" completed before starting procedure, as per protocol. Position: Prone Target Area: For Lumbar Facet blocks, the target is the groove formed by the junction of the transverse process and superior articular process. For the L5 dorsal ramus, the target is the notch between superior articular process and sacral ala. For the S1 dorsal ramus, the target is the superior and lateral edge of the posterior S1 Sacral foramen. Approach: Paramedial approach. Area Prepped: Entire Posterior Lumbosacral Region Prepping solution: ChloraPrep (2% chlorhexidine gluconate and 70% isopropyl alcohol) Safety Precautions: Aspiration looking for blood return was conducted prior to all injections. At no point did we inject any substances, as a needle was being advanced. No attempts were made at seeking any paresthesias. Safe injection practices and needle disposal techniques used. Medications properly checked for expiration dates. SDV (single dose vial) medications used. Description of the Procedure: Protocol guidelines were followed. The  patient was placed in position over the fluoroscopy table. The target area was identified and the area prepped in the usual manner. Skin desensitized using vapocoolant spray. Skin & deeper tissues infiltrated with local anesthetic. Appropriate amount of time allowed to pass for local anesthetics to take effect. The procedure needle was introduced through the skin, ipsilateral to the reported pain, and advanced to the  target area. Employing the "Medial Branch Technique", the needles were advanced to the angle made by the superior and medial portion of the transverse process, and the lateral and inferior portion of the superior articulating process of the targeted vertebral bodies. This area is known as "Burton's Eye" or the "Eye of the [place] Dog". A procedure needle was introduced through the skin, and this time advanced to the angle made by the superior and medial border of the sacral ala, and the lateral border of the S1 vertebral body. This last needle was later repositioned at the superior and lateral border of the posterior S1 foramen. Negative aspiration confirmed. Solution injected in intermittent fashion, asking for systemic symptoms every 0.5cc of injectate. The needles were then removed and the area cleansed, making sure to leave some of the prepping solution back to take advantage of its long term bactericidal properties. EBL: None Materials & Medications Used:  Needle(s) Used: 22g - 3.5" Spinal Needle(s)  Imaging Guidance:   Type of Imaging Technique: Fluoroscopy Guidance (Spinal) Indication(s): Assistance in needle guidance and placement for procedures requiring needle placement in or near specific anatomical locations not easily accessible without such assistance. Exposure Time: Please see nurses notes. Contrast: None required. Fluoroscopic Guidance: I was personally present in the fluoroscopy suite, where the patient was placed in position for the procedure, over the fluoroscopy-compatible  table. Fluoroscopy was manipulated, using "Tunnel Vision Technique", to obtain the best possible view of the target area, on the affected side. Parallax error was corrected before commencing the procedure. A "direction-depth-direction" technique was used to introduce the needle under continuous pulsed fluoroscopic guidance. Once the target was reached, antero-posterior, oblique, and lateral fluoroscopic projection views were taken to confirm needle placement in all planes. Permanently recorded images stored by scanning into EMR. Interpretation: Intraoperative imaging interpretation by performing Physician. Adequate needle placement confirmed. Adequate needle placement confirmed in AP, lateral, & Oblique Views. No contrast injected.  Antibiotic Prophylaxis:  Indication(s): No indications identified. Type:  Antibiotics Given (last 72 hours)    None       Post-operative Assessment:   Complications: No immediate post-treatment complications were observed. Disposition: Return to clinic for follow-up evaluation. The patient tolerated the entire procedure well. A repeat set of vitals were taken after the procedure and the patient was kept under observation following institutional policy, for this procedure. Post-procedural neurological assessment was performed, showing return to baseline, prior to discharge. The patient was discharged home, once institutional criteria were met. The patient was provided with post-procedure discharge instructions, including a section on how to identify potential problems. Should any problems arise concerning this procedure, the patient was given instructions to immediately contact us, at any time, without hesitation. In any case, we plan to contact the patient by telephone for a follow-up status report regarding this interventional procedure. Comments:  No additional relevant information.  Plan of Care   Problem List Items Addressed This Visit      High   Chronic low  back pain (Location of Primary Source of Pain) (Bilateral) (R>L) (Chronic)   Relevant Medications   fentaNYL (SUBLIMAZE) injection 25-50 mcg   triamcinolone acetonide (KENALOG-40) injection 40 mg (Completed)   Lumbar facet syndrome (Location of Primary Source of Pain) (Bilateral) (R>L) - Primary (Chronic)   Relevant Medications   fentaNYL (SUBLIMAZE) injection 25-50 mcg   lactated ringers infusion 1,000 mL (Completed)   midazolam (VERSED) 5 MG/5ML injection 1-2 mg   triamcinolone acetonide (KENALOG-40) injection 40 mg (Completed)   lidocaine (  PF) (XYLOCAINE) 1 % injection 10 mL (Completed)   ropivacaine (PF) 2 mg/ml (0.2%) (NAROPIN) epidural 9 mL (Completed)   Other Relevant Orders   LUMBAR FACET(MEDIAL BRANCH NERVE BLOCK) MBNB   Lumbar spondylosis (Chronic)   Relevant Medications   fentaNYL (SUBLIMAZE) injection 25-50 mcg   triamcinolone acetonide (KENALOG-40) injection 40 mg (Completed)    Other Visit Diagnoses   None.     Requested PM Follow-up: Return in about 2 weeks (around 08/31/2016) for Post-Procedure evaluation.  Future Appointments Date Time Provider [place]  09/14/2016 9:00 AM Milinda Pointer, MD [facility] None  09/26/2016 11:30 AM Lloyd Huger, MD [facility] None  10/02/2016 2:00 PM Einar Pheasant, MD [facility] None  10/18/2016 10:00 AM Milinda Pointer, MD [facility] None  05/02/2017 11:00 AM Noreene Filbert, MD [facility] None    Primary Care Physician: Einar Pheasant, MD Location: [facility] Note by: Kathlen Brunswick. Dossie Arbour, M.D, DABA, DABAPM, DABPM, DABIPP, FIPP   Illustration of the posterior view of the lumbar spine and the posterior neural structures. Laminae of L2 through S1 are labeled. DPRL5, dorsal primary ramus of L5; DPRS1, dorsal primary ramus of S1; DPR3, dorsal primary ramus of L3; FJ, facet (zygapophyseal) joint L3-L4; I, inferior articular process of L4; LB1, lateral branch of dorsal primary ramus of L1;  IAB, inferior articular branches from L3 medial branch (supplies L4-L5 facet joint); IBP, intermediate branch plexus; MB3, medial branch of dorsal primary ramus of L3; NR3, third lumbar nerve root; S, superior articular process of L5; SAB, superior articular branches from L4 (supplies L4-5 facet joint also); TP3, transverse process of L3.  Disclaimer:  Medicine is not an Chief Strategy Officer. The only guarantee in medicine is that nothing is guaranteed. It is important to note that the decision to proceed with this intervention was based on the information collected from the patient. The Data and conclusions were drawn from the patient's questionnaire, the interview, and the physical examination. Because the information was provided in large part by the patient, it cannot be guaranteed that it has not been purposely or unconsciously manipulated. Every effort has been made to obtain as much relevant data as possible for this evaluation. It is important to note that the conclusions that lead to this procedure are derived in large part from the available data. Always take into account that the treatment will also be dependent on availability of resources and existing treatment guidelines, considered by other Pain Management Practitioners as being common knowledge and practice, at the time of the intervention. For Medico-Legal purposes, it is also important to point out that variation in procedural techniques and pharmacological choices are the acceptable norm. The indications, contraindications, technique, and results of the above procedure should only be interpreted and judged by a Board-Certified Interventional Pain Specialist with extensive familiarity and expertise in the same exact procedure and technique. Attempts at providing opinions without similar or greater experience and expertise than that of the treating physician will be considered as inappropriate and unethical, and shall result in a formal complaint to the  state medical board and applicable specialty societies.

## 2016-08-17 NOTE — Progress Notes (Signed)
Safety precautions to be maintained throughout the outpatient stay will include: orient to surroundings, keep bed in low position, maintain call bell within reach at all times, provide assistance with transfer out of bed and ambulation.  

## 2016-08-18 ENCOUNTER — Telehealth: Payer: Self-pay

## 2016-08-18 NOTE — Telephone Encounter (Signed)
Denies any needs or complications at this time. Instructed to call if needed

## 2016-08-24 ENCOUNTER — Other Ambulatory Visit: Payer: Self-pay | Admitting: Internal Medicine

## 2016-09-13 ENCOUNTER — Ambulatory Visit: Payer: Medicare Other | Admitting: Anesthesiology

## 2016-09-13 ENCOUNTER — Encounter: Payer: Self-pay | Admitting: Anesthesiology

## 2016-09-13 ENCOUNTER — Encounter
Admission: RE | Disposition: A | Discharge status: Home / Self Care | Payer: Self-pay | Source: Ambulatory Visit | Attending: Unknown Physician Specialty

## 2016-09-13 ENCOUNTER — Ambulatory Visit
Admission: RE | Admit: 2016-09-13 | Discharge: 2016-09-13 | Disposition: A | Discharge status: Home / Self Care | Payer: Medicare Other | Source: Ambulatory Visit | Attending: Unknown Physician Specialty | Admitting: Unknown Physician Specialty

## 2016-09-13 DIAGNOSIS — K449 Diaphragmatic hernia without obstruction or gangrene: Secondary | ICD-10-CM | POA: Insufficient documentation

## 2016-09-13 DIAGNOSIS — Z833 Family history of diabetes mellitus: Secondary | ICD-10-CM | POA: Diagnosis not present

## 2016-09-13 DIAGNOSIS — K6389 Other specified diseases of intestine: Secondary | ICD-10-CM | POA: Insufficient documentation

## 2016-09-13 DIAGNOSIS — Z853 Personal history of malignant neoplasm of breast: Secondary | ICD-10-CM | POA: Insufficient documentation

## 2016-09-13 DIAGNOSIS — Z7982 Long term (current) use of aspirin: Secondary | ICD-10-CM | POA: Diagnosis not present

## 2016-09-13 DIAGNOSIS — Z9012 Acquired absence of left breast and nipple: Secondary | ICD-10-CM | POA: Diagnosis not present

## 2016-09-13 DIAGNOSIS — K5790 Diverticulosis of intestine, part unspecified, without perforation or abscess without bleeding: Secondary | ICD-10-CM | POA: Diagnosis not present

## 2016-09-13 DIAGNOSIS — Z8371 Family history of colonic polyps: Secondary | ICD-10-CM | POA: Insufficient documentation

## 2016-09-13 DIAGNOSIS — K5900 Constipation, unspecified: Secondary | ICD-10-CM | POA: Diagnosis not present

## 2016-09-13 DIAGNOSIS — K219 Gastro-esophageal reflux disease without esophagitis: Secondary | ICD-10-CM | POA: Insufficient documentation

## 2016-09-13 DIAGNOSIS — M5137 Other intervertebral disc degeneration, lumbosacral region: Secondary | ICD-10-CM | POA: Diagnosis not present

## 2016-09-13 DIAGNOSIS — I10 Essential (primary) hypertension: Secondary | ICD-10-CM | POA: Insufficient documentation

## 2016-09-13 DIAGNOSIS — Z79899 Other long term (current) drug therapy: Secondary | ICD-10-CM | POA: Diagnosis not present

## 2016-09-13 DIAGNOSIS — Z823 Family history of stroke: Secondary | ICD-10-CM | POA: Diagnosis not present

## 2016-09-13 DIAGNOSIS — D649 Anemia, unspecified: Secondary | ICD-10-CM | POA: Diagnosis not present

## 2016-09-13 DIAGNOSIS — Z820 Family history of epilepsy and other diseases of the nervous system: Secondary | ICD-10-CM | POA: Insufficient documentation

## 2016-09-13 DIAGNOSIS — M549 Dorsalgia, unspecified: Secondary | ICD-10-CM | POA: Diagnosis not present

## 2016-09-13 DIAGNOSIS — G20 Parkinson's disease: Secondary | ICD-10-CM | POA: Insufficient documentation

## 2016-09-13 DIAGNOSIS — K640 First degree hemorrhoids: Secondary | ICD-10-CM | POA: Diagnosis not present

## 2016-09-13 DIAGNOSIS — N3946 Mixed incontinence: Secondary | ICD-10-CM | POA: Insufficient documentation

## 2016-09-13 DIAGNOSIS — K921 Melena: Secondary | ICD-10-CM | POA: Diagnosis not present

## 2016-09-13 DIAGNOSIS — Z86010 Personal history of colonic polyps: Secondary | ICD-10-CM | POA: Diagnosis not present

## 2016-09-13 DIAGNOSIS — M1990 Unspecified osteoarthritis, unspecified site: Secondary | ICD-10-CM | POA: Insufficient documentation

## 2016-09-13 DIAGNOSIS — N3020 Other chronic cystitis without hematuria: Secondary | ICD-10-CM | POA: Diagnosis not present

## 2016-09-13 DIAGNOSIS — K5730 Diverticulosis of large intestine without perforation or abscess without bleeding: Secondary | ICD-10-CM | POA: Diagnosis not present

## 2016-09-13 DIAGNOSIS — Z90710 Acquired absence of both cervix and uterus: Secondary | ICD-10-CM | POA: Diagnosis not present

## 2016-09-13 DIAGNOSIS — K648 Other hemorrhoids: Secondary | ICD-10-CM | POA: Diagnosis not present

## 2016-09-13 DIAGNOSIS — M47816 Spondylosis without myelopathy or radiculopathy, lumbar region: Secondary | ICD-10-CM

## 2016-09-13 DIAGNOSIS — Z1211 Encounter for screening for malignant neoplasm of colon: Secondary | ICD-10-CM | POA: Diagnosis not present

## 2016-09-13 HISTORY — PX: COLONOSCOPY WITH PROPOFOL: SHX5780

## 2016-09-13 LAB — HM COLONOSCOPY

## 2016-09-13 SURGERY — COLONOSCOPY WITH PROPOFOL
Anesthesia: General

## 2016-09-13 MED ORDER — SODIUM CHLORIDE 0.9 % IV SOLN: INTRAVENOUS | Status: DC

## 2016-09-13 MED ORDER — SODIUM CHLORIDE 0.9 % IV SOLN
INTRAVENOUS | Status: DC
  Administered 2016-09-13: 1000 mL via INTRAVENOUS

## 2016-09-13 MED ORDER — SODIUM CHLORIDE 0.9 % IV SOLN
INTRAVENOUS | Status: DC
  Administered 2016-09-13: 11:00:00 via INTRAVENOUS

## 2016-09-13 MED ORDER — PROPOFOL 500 MG/50ML IV EMUL
INTRAVENOUS | Status: DC | PRN
  Administered 2016-09-13: 50 ug/kg/min via INTRAVENOUS

## 2016-09-13 NOTE — Transfer of Care (Signed)
Immediate Anesthesia Transfer of Care Note  Patient: Wanda Martinez  Procedure(s) Performed: Procedure(s): COLONOSCOPY WITH PROPOFOL (N/A)  Patient Location: PACU  Anesthesia Type:General  Level of Consciousness: awake, alert  and oriented  Airway & Oxygen Therapy: Patient Spontanous Breathing and Patient connected to nasal cannula oxygen  Post-op Assessment: Report given to RN and Post -op Vital signs reviewed and stable  Post vital signs: Reviewed and stable  Last Vitals:  Vitals:   09/13/16 0951  BP: 131/86  Pulse: 67  Resp: 16  Temp: 36.7 C    Last Pain:  Vitals:   09/13/16 0951  TempSrc: Tympanic  PainSc: 4          Complications: No apparent anesthesia complications

## 2016-09-13 NOTE — Anesthesia Preprocedure Evaluation (Signed)
Anesthesia Evaluation  Patient identified by MRN, date of birth, ID band Patient awake    Reviewed: Allergy & Precautions, NPO status , Patient's Chart, lab work & pertinent test results, reviewed documented beta blocker date and time   Airway Mallampati: II       Dental  (+) Chipped   Pulmonary shortness of breath and with exertion,    Pulmonary exam normal breath sounds clear to auscultation       Cardiovascular hypertension, Pt. on medications and Pt. on home beta blockers Normal cardiovascular exam     Neuro/Psych  Headaches, PSYCHIATRIC DISORDERS Depression  Neuromuscular disease    GI/Hepatic Neg liver ROS, hiatal hernia, GERD  Medicated,  Endo/Other  negative endocrine ROS  Renal/GU Renal InsufficiencyRenal disease  negative genitourinary   Musculoskeletal  (+) Arthritis , Osteoarthritis,  Chronic low back pain   Abdominal Normal abdominal exam  (+)   Peds negative pediatric ROS (+)  Hematology  (+) anemia ,   Anesthesia Other Findings   Reproductive/Obstetrics                             Anesthesia Physical Anesthesia Plan  ASA: III  Anesthesia Plan: General   Post-op Pain Management:    Induction: Intravenous  Airway Management Planned: Nasal Cannula  Additional Equipment:   Intra-op Plan:   Post-operative Plan:   Informed Consent: I have reviewed the patients History and Physical, chart, labs and discussed the procedure including the risks, benefits and alternatives for the proposed anesthesia with the patient or authorized representative who has indicated his/her understanding and acceptance.   Dental advisory given  Plan Discussed with: CRNA and Surgeon  Anesthesia Plan Comments:         Anesthesia Quick Evaluation

## 2016-09-13 NOTE — Op Note (Signed)
[facility] Patient Name: Wanda Martinez Procedure Date: 09/13/2016 10:35 AM MRN: [id number] Account #: [id number] Date of Birth: 07/31/1935 Admit Type: Outpatient Age: 81 Room: [facility] ENDO ROOM 3 Gender: Female Note Status: Finalized Procedure:            Colonoscopy Indications:          High risk colon cancer surveillance: Personal history                        of colonic polyps Providers:            Manya Silvas, MD Referring MD:         Einar Pheasant, MD (Referring MD) Medicines:            Propofol per Anesthesia Complications:        No immediate complications. Procedure:            Pre-Anesthesia Assessment:                       - After reviewing the risks and benefits, the patient                        was deemed in satisfactory condition to undergo the                        procedure.                       After obtaining informed consent, the colonoscope was                        passed under direct vision. Throughout the procedure,                        the patient's blood pressure, pulse, and oxygen                        saturations were monitored continuously. The                        Colonoscope was introduced through the anus and                        advanced to the the cecum, identified by appendiceal                        orifice and ileocecal valve. The colonoscopy was                        performed without difficulty. The patient tolerated the                        procedure well. The quality of the bowel preparation                        was good. Findings:      Many small-mouthed diverticula were found in the sigmoid colon.      Internal hemorrhoids were found during endoscopy. The hemorrhoids were       small and Grade I (internal hemorrhoids that do not prolapse).      A diffuse area of mild  melanosis was found in the entire colon.      The exam was otherwise without abnormality. Impression:           -  Diverticulosis in the sigmoid colon.                       - Internal hemorrhoids.                       - Melanosis in the colon.                       - The examination was otherwise normal.                       - No specimens collected. Recommendation:       Due to age no repeat colonoscopy recommended.                       - The findings and recommendations were discussed with                        the patient's family. Manya Silvas, MD 09/13/2016 11:23:28 AM This report has been signed electronically. Number of Addenda: 0 Note Initiated On: 09/13/2016 10:35 AM Scope Withdrawal Time: 0 hours 8 minutes 41 seconds  Total Procedure Duration: 0 hours 17 minutes 23 seconds       [place]

## 2016-09-13 NOTE — H&P (Signed)
Primary Care Physician:  Einar Pheasant, MD Primary Gastroenterologist:  Dr. Vira Agar  Pre-Procedure History & Physical: HPI:  Wanda Martinez is a 81 y.o. female is here for an colonoscopy.   Past Medical History:  Diagnosis Date  . Anemia   . Arm pain 07/26/2015  . Arthritis   . Arthritis, degenerative 03/26/2014  . Back pain 11/01/2013  . Breast cancer ([place])    Masectomy - left - 1986   . Breast cancer [facility])    Mastectomy-right -2014  . Chronic cystitis   . Degeneration of intervertebral disc of lumbosacral region 03/26/2014  . Enthesopathy of hip 03/26/2014  . GERD (gastroesophageal reflux disease)   . Hiatal hernia   . HTN (hypertension)   . LBP (low back pain) 03/26/2014  . Neck pain 11/01/2013  . Parkinson disease ([place])   . Urinary incontinence    mixed     Past Surgical History:  Procedure Laterality Date  . ABDOMINAL HYSTERECTOMY    . BREAST BIOPSY  2013  . BREAST ENHANCEMENT SURGERY  1987  . BREAST IMPLANT REMOVAL    . BREAST IMPLANT REMOVAL Right 08/29/2012  . BREAST SURGERY  1986   s/p left mastectomy  . MASTECTOMY  08/2012   right  . TONSILLECTOMY AND ADENOIDECTOMY  81  . VESICOVAGINAL FISTULA CLOSURE W/ TAH  1983    Prior to Admission medications   Medication Sig Start Date End Date Taking? Authorizing Provider  amLODipine (NORVASC) 2.5 MG tablet TAKE 1 TABLET BY MOUTH ONCE DAILY. 03/27/16  Yes Einar Pheasant, MD  aspirin 81 MG EC tablet Take 81 mg by mouth daily as needed.     Yes Historical Provider, MD  Calcium Carbonate (CALCIUM 600) 1500 MG TABS Take 1 tablet by mouth daily.     Yes Historical Provider, MD  carbidopa-levodopa (SINEMET IR) 25-100 MG per tablet Take 1 tablet by mouth 3 (three) times daily.   Yes Historical Provider, MD  citalopram (CELEXA) 20 MG tablet Take 20 mg by mouth daily. 06/01/15  Yes Historical Provider, MD  cyclobenzaprine (FLEXERIL) 10 MG tablet Take 1 tablet (10 mg total) by mouth at bedtime. 08/02/16  Yes Milinda Pointer,  MD  gabapentin (NEURONTIN) 300 MG capsule Take 1 capsule (300 mg total) by mouth 2 (two) times daily. 08/02/16  Yes Milinda Pointer, MD  GAVILYTE-N WITH FLAVOR PACK 420 g solution  06/23/16  Yes Historical Provider, MD  hydrochlorothiazide (HYDRODIURIL) 25 MG tablet TAKE 1 TABLET BY MOUTH ONCE DAILY. 03/27/16  Yes Einar Pheasant, MD  HYDROcodone-acetaminophen (NORCO/VICODIN) 5-325 MG tablet Take 1 tablet by mouth every 8 (eight) hours as needed for severe pain. 08/02/16  Yes Milinda Pointer, MD  HYDROcodone-acetaminophen (NORCO/VICODIN) 5-325 MG tablet Take 1 tablet by mouth every 8 (eight) hours as needed for severe pain. 08/02/16  Yes Milinda Pointer, MD  HYDROcodone-acetaminophen (NORCO/VICODIN) 5-325 MG tablet Take 1 tablet by mouth every 8 (eight) hours as needed for severe pain. 08/02/16  Yes Milinda Pointer, MD  letrozole [facility]) 2.5 MG tablet Take 1 tablet (2.5 mg total) by mouth daily. 04/26/16  Yes Lloyd Huger, MD  losartan (COZAAR) 100 MG tablet TAKE 1 TABLET BY MOUTH ONCE DAILY. 03/27/16  Yes Einar Pheasant, MD  lubiprostone (AMITIZA) 24 MCG capsule TAKE 1 CAPSULE BY MOUTH ONCE DAILY WITH BREAKFAST. 07/25/16  Yes Historical Provider, MD  magnesium oxide (MAGNESIUM-OXIDE) 400 (241.3 Mg) MG tablet Take 1 tablet (400 mg total) by mouth daily. 08/02/16  Yes Milinda Pointer, MD  meloxicam (  MOBIC) 7.5 MG tablet Take 1 tablet (7.5 mg total) by mouth daily. 08/02/16  Yes Milinda Pointer, MD  metoprolol succinate (TOPROL-XL) 25 MG 24 hr tablet TAKE 1 TABLET BY MOUTH TWICE DAILY 03/27/16  Yes Einar Pheasant, MD  Multiple Vitamin (MULTIVITAMIN) tablet Take 1 tablet by mouth daily.   Yes Historical Provider, MD  omeprazole (PRILOSEC) 20 MG capsule TAKE 1 CAPSULE BY MOUTH TWICE DAILY FOR REFLUX. 03/27/16  Yes Einar Pheasant, MD  pravastatin (PRAVACHOL) 20 MG tablet TAKE 1 TABLET BY MOUTH ONCE DAILY. 08/24/16  Yes Einar Pheasant, MD  ciprofloxacin (CIPRO) 500 MG tablet  06/09/16   Historical  Provider, MD    Allergies as of 07/28/2016 - Review Complete 06/12/2016  Allergen Reaction Noted  . Vesicare [solifenacin] Other (See Comments) 02/20/2013    Family History  Problem Relation Age of Onset  . Diabetes Father   . Stroke Father   . Colon polyps Father   . Stroke Mother   . Parkinson's disease Mother     Social History   Social History  . Marital status: Married    Spouse name: N/A  . Number of children: 3  . Years of education: N/A   Occupational History  . Not on file.   Social History Main Topics  . Smoking status: Never Smoker  . Smokeless tobacco: Never Used     Comment: tobacco use - no  . Alcohol use No  . Drug use: No  . Sexual activity: Not on file   Other Topics Concern  . Not on file   Social History Narrative  . No narrative on file    Review of Systems: See HPI, otherwise negative ROS  Physical Exam: BP 131/86   Pulse 67   Temp 98 F (36.7 C) (Tympanic)   Resp 16   Ht 5\' 4"  (1.626 m)   Wt 81.2 kg (179 lb)   LMP 12/18/1981   SpO2 100%   BMI 30.73 kg/m  General:   Alert,  pleasant and cooperative in NAD Head:  Normocephalic and atraumatic. Neck:  Supple; no masses or thyromegaly. Lungs:  Clear throughout to auscultation.    Heart:  Regular rate and rhythm. Abdomen:  Soft, nontender and nondistended. Normal bowel sounds, without guarding, and without rebound.   Neurologic:  Alert and  oriented x4;  grossly normal neurologically.  Impression/Plan: Wanda Martinez is here for an colonoscopy to be performed for [facility] colon polyps  Risks, benefits, limitations, and alternatives regarding  colonoscopy have been reviewed with the patient.  Questions have been answered.  All parties agreeable.   Gaylyn Cheers, MD  09/13/2016, 10:58 AM

## 2016-09-13 NOTE — Anesthesia Postprocedure Evaluation (Signed)
Anesthesia Post Note  Patient: Wanda Martinez  Procedure(s) Performed: Procedure(s) (LRB): COLONOSCOPY WITH PROPOFOL (N/A)  Patient location during evaluation: PACU Anesthesia Type: General Level of consciousness: awake and alert and oriented Pain management: pain level controlled Vital Signs Assessment: post-procedure vital signs reviewed and stable Respiratory status: spontaneous breathing Cardiovascular status: blood pressure returned to baseline Anesthetic complications: no    Last Vitals:  Vitals:   09/13/16 1143 09/13/16 1153  BP: (!) 128/98 (!) 168/86  Pulse: (!) 56 (!) 57  Resp: 16 15  Temp:      Last Pain:  Vitals:   09/13/16 1133  TempSrc:   PainSc: 0-No pain                 An Schnabel

## 2016-09-14 ENCOUNTER — Ambulatory Visit: Payer: Medicare Other | Attending: Pain Medicine | Admitting: Pain Medicine

## 2016-09-14 ENCOUNTER — Encounter: Payer: Self-pay | Admitting: Unknown Physician Specialty

## 2016-09-14 VITALS — BP 128/78 | HR 64 | Temp 98.3°F | Resp 16 | Ht 64.0 in | Wt 179.0 lb

## 2016-09-14 DIAGNOSIS — G8929 Other chronic pain: Secondary | ICD-10-CM | POA: Insufficient documentation

## 2016-09-14 DIAGNOSIS — G20 Parkinson's disease: Secondary | ICD-10-CM | POA: Diagnosis not present

## 2016-09-14 DIAGNOSIS — Z6830 Body mass index (BMI) 30.0-30.9, adult: Secondary | ICD-10-CM | POA: Diagnosis not present

## 2016-09-14 DIAGNOSIS — M159 Polyosteoarthritis, unspecified: Secondary | ICD-10-CM

## 2016-09-14 DIAGNOSIS — M791 Myalgia: Secondary | ICD-10-CM | POA: Insufficient documentation

## 2016-09-14 DIAGNOSIS — E669 Obesity, unspecified: Secondary | ICD-10-CM | POA: Insufficient documentation

## 2016-09-14 DIAGNOSIS — N182 Chronic kidney disease, stage 2 (mild): Secondary | ICD-10-CM | POA: Diagnosis not present

## 2016-09-14 DIAGNOSIS — M5412 Radiculopathy, cervical region: Secondary | ICD-10-CM | POA: Insufficient documentation

## 2016-09-14 DIAGNOSIS — M150 Primary generalized (osteo)arthritis: Secondary | ICD-10-CM | POA: Diagnosis not present

## 2016-09-14 DIAGNOSIS — K5909 Other constipation: Secondary | ICD-10-CM | POA: Insufficient documentation

## 2016-09-14 DIAGNOSIS — M545 Low back pain, unspecified: Secondary | ICD-10-CM

## 2016-09-14 DIAGNOSIS — M546 Pain in thoracic spine: Secondary | ICD-10-CM | POA: Insufficient documentation

## 2016-09-14 DIAGNOSIS — Z7982 Long term (current) use of aspirin: Secondary | ICD-10-CM | POA: Diagnosis not present

## 2016-09-14 DIAGNOSIS — M792 Neuralgia and neuritis, unspecified: Secondary | ICD-10-CM

## 2016-09-14 DIAGNOSIS — M1991 Primary osteoarthritis, unspecified site: Secondary | ICD-10-CM | POA: Insufficient documentation

## 2016-09-14 DIAGNOSIS — Z5181 Encounter for therapeutic drug level monitoring: Secondary | ICD-10-CM | POA: Diagnosis not present

## 2016-09-14 DIAGNOSIS — M797 Fibromyalgia: Secondary | ICD-10-CM

## 2016-09-14 DIAGNOSIS — I129 Hypertensive chronic kidney disease with stage 1 through stage 4 chronic kidney disease, or unspecified chronic kidney disease: Secondary | ICD-10-CM | POA: Diagnosis not present

## 2016-09-14 DIAGNOSIS — M47816 Spondylosis without myelopathy or radiculopathy, lumbar region: Secondary | ICD-10-CM

## 2016-09-14 DIAGNOSIS — Z79891 Long term (current) use of opiate analgesic: Secondary | ICD-10-CM | POA: Diagnosis not present

## 2016-09-14 DIAGNOSIS — Z79899 Other long term (current) drug therapy: Secondary | ICD-10-CM | POA: Insufficient documentation

## 2016-09-14 DIAGNOSIS — M7918 Myalgia, other site: Secondary | ICD-10-CM

## 2016-09-14 DIAGNOSIS — E7800 Pure hypercholesterolemia, unspecified: Secondary | ICD-10-CM | POA: Diagnosis not present

## 2016-09-14 MED ORDER — CYCLOBENZAPRINE HCL 10 MG PO TABS: 10.0000 mg | ORAL_TABLET | Freq: Every day | ORAL | 2 refills | Status: DC

## 2016-09-14 MED ORDER — HYDROCODONE-ACETAMINOPHEN 5-325 MG PO TABS: 1.0000 | ORAL_TABLET | Freq: Three times a day (TID) | ORAL | 0 refills | Status: DC | PRN

## 2016-09-14 MED ORDER — GABAPENTIN 300 MG PO CAPS: 300.0000 mg | ORAL_CAPSULE | Freq: Two times a day (BID) | ORAL | 2 refills | Status: DC

## 2016-09-14 MED ORDER — MAGNESIUM OXIDE 400 (241.3 MG) MG PO TABS: 1.0000 | ORAL_TABLET | Freq: Every day | ORAL | 2 refills | Status: DC

## 2016-09-14 MED ORDER — MELOXICAM 7.5 MG PO TABS: 7.5000 mg | ORAL_TABLET | Freq: Every day | ORAL | 2 refills | Status: DC

## 2016-09-14 NOTE — Progress Notes (Signed)
Patient's Name: Wanda Martinez  MRN: [id number]  Referring Provider: Einar Pheasant, MD  DOB: 30-Jun-1935  PCP: Einar Pheasant, MD  DOS: 09/14/2016  Note by: Kathlen Brunswick. Dossie Arbour, MD  Service setting: Ambulatory outpatient  Specialty: Interventional Pain Management  Location: [facility] (AMB) Pain Management Facility    Patient type: Established   Primary Reason(s) for Visit: Encounter for prescription drug management & post-procedure evaluation of chronic illness with mild to moderate exacerbation(Level of risk: moderate) CC: Back Pain (low)  HPI  Wanda Martinez is a 81 y.o. year old, female patient, who comes today for an initial evaluation. She has Hypercholesterolemia; Essential hypertension; DYSPNEA; CHEST PAIN; Frequent UTI; Cystocele; Breast cancer ([place]); Anemia; Tremor; Toenail fungus; Skin lesions; Headache; Obesity (BMI 30-39.9); Dysuria; Sinusitis; Health care maintenance; Parkinsons ([place]); Fatigue; Breast cancer, right ([place]); Chronic kidney disease (CKD), stage II (mild); Cystocele, midline; Clinical depression; Incomplete bladder emptying; Mixed incontinence; HLD (hyperlipidemia); Bladder infection, chronic; Parkinson's disease ([place]); Pure hypercholesterolemia; Hernia, rectovaginal; Long term current use of opiate analgesic; Long term prescription opiate use; Opiate use; Encounter for therapeutic drug level monitoring; Encounter for chronic pain management; Chronic low back pain (Location of Primary Source of Pain) (Bilateral) (R>L); Lumbar spondylosis; Chronic hip pain; Chronic neck pain; Cervical spondylosis; Chronic cervical radicular pain (Right); Chronic upper extremity pain (Right); Chronic pain; Osteoarthritis; Diffuse myofascial pain syndrome; Neurogenic pain; Chronic upper back pain (Right); Myofascial pain syndrome (Right) (cervicothoracic); Vaginitis; Lumbar facet syndrome (Location of Primary Source of Pain) (Bilateral) (R>L); Malignant neoplasm of right female breast ([place]); Chronic  constipation; Cervical facet hypertrophy; Cervical facet syndrome; and Chronic shoulder pain on her problem list.. Her primarily concern today is the Back Pain (low)  Pain Assessment: Self-Reported Pain Score: 3 /10             Reported level is compatible with observation.       Pain Type: Chronic pain Pain Location: Back Pain Orientation: Lower Pain Descriptors / Indicators: Constant, Aching Pain Frequency: Constant  The patient comes into the clinics today for post-procedure evaluation on the interventional treatment done on 08/17/2016. In addition, she comes in today for pharmacological management of her chronic pain.  The patient  reports that she does not use drugs.  Date of Last Visit: 08/17/16 Service Provided on Last Visit: Procedure (right lumbar facet block)  Controlled Substance Pharmacotherapy Assessment & REMS (Risk Evaluation and Mitigation Strategy)  Analgesic:Hydrocodone/APAP 5/325 one tablet every 8 hours (15 mg/day) MME/day:15 mg/day. Pill Count: Patient came in today for follow-up after the procedure and she was not expecting to get refills. We have reminded her that she needs to bring her medicines any time that it's not a procedure. Pharmacokinetics: Onset of action (Liberation/Absorption): Within expected pharmacological parameters Time to Peak effect (Distribution): Timing and results are as within normal expected parameters Duration of action (Metabolism/Excretion): Within normal limits for medication Pharmacodynamics: Analgesic Effect: More than 50% Activity Facilitation: Medication(s) allow patient to sit, stand, walk, and do the basic ADLs Perceived Effectiveness: Described as relatively effective, allowing for increase in activities of daily living (ADL) Side-effects or Adverse reactions: None reported Monitoring: [place] PMP: Online review of the past 28-month period conducted. Compliant with practice rules and regulations List of all UDS test(s) done:  Lab  Results  Component Value Date   TOXASSSELUR FINAL 03/27/2016   [place] FINAL 12/29/2015   Last UDS on record: ToxAssure Select 13  Date Value Ref Range Status  03/27/2016 FINAL  Final    Comment:    ====================================================================  TOXASSURE SELECT 13 (MW) ==================================================================== Test                             Result       Flag       Units Drug Present and Declared for Prescription Verification   Hydrocodone                    644          EXPECTED   ng/mg creat   Norhydrocodone                 707          EXPECTED   ng/mg creat    Sources of hydrocodone include scheduled prescription    medications. Norhydrocodone is an expected metabolite of    hydrocodone. ==================================================================== Test                      Result    Flag   Units      Ref Range   Creatinine              68               mg/dL      >=20 ==================================================================== Declared Medications:  The flagging and interpretation on this report are based on the  following declared medications.  Unexpected results may arise from  inaccuracies in the declared medications.  **Note: The testing scope of this panel includes these medications:  Hydrocodone (Norco)  Hydrocodone (Vicodin)  **Note: The testing scope of this panel does not include following  reported medications:  Acetaminophen (Norco)  Acetaminophen (Vicodin)  Amlodipine (Norvasc)  Aspirin  Carbidopa (Sinemet)  Citalopram (Celexa)  Cyclobenzaprine (Flexeril)  Gabapentin (Neurontin)  Hydrochlorothiazide (Hydrodiuril)  Letrozole (Femara)  Levodopa (Sinemet)  Losartan (Cozaar)  Magnesium (Mag-Ox)  Meloxicam (Mobic)  Metoprolol (Toprol)  Multivitamin  Omeprazole (Prilosec)  Pravastatin (Pravachol)  Supplement (OsCal)  Trazodone  (Desyrel) ==================================================================== For clinical consultation, please call [phone]. ====================================================================    UDS interpretation: Compliant          Medication Assessment Form: Reviewed. Patient indicates being compliant with therapy Treatment compliance: Compliant Risk Assessment: Aberrant Behavior: None observed today Substance Use Disorder (SUD) Risk Level: Low Risk of opioid abuse or dependence: 0.7-3.0% with doses ? 36 MME/day and 6.1-26% with doses ? 120 MME/day. Opioid Risk Tool (ORT) Score: 0 Low Risk for SUD (Score <3) Depression Scale Score: PHQ-2: 0 No depression (0) PHQ-9: 0 No depression (0-4) Risk Mitigation Strategies:  Patient Counseling:  Completed Patient-Prescriber Agreement (PPA): Present and active  Notification to other healthcare providers: Done   Pharmacologic Plan: No change in therapy, at this time  Post-Procedure Assessment  Procedure done on 08/17/2016: Right-sided lumbar facet block under fluoroscopic guidance and IV sedation. Complications experienced at the time of the procedure: None Side-effects or Adverse reactions: None reported Sedation: Sedation provided. When no sedatives are used, the analgesic levels obtained are directly associated with the effectiveness of the local anesthetics. On the other hand, when sedation is provided, the level of analgesia obtained during the initial 1 hour, immediately following the intervention, is believed to be the result of a combination of factors. These factors may include, but are not limited to: 1. The effectiveness of the local anesthetics used. 2. The effects of the analgesic(s) and/or anxiolytic(s) used. 3. The degree of discomfort experienced by the patient at the  time of the procedure. 4. The patients ability and reliability in recalling and recording the events. 5. The presence and influence of possible  secondary gains. Results: Relief during the 1st hour after the procedure: 100 % (Ultra-Short Term Relief) Interpretative note: Analgesia during this period is likely to be Local Anesthetic and/or IV Sedative (Analgesic/Anxiolitic) related Local Anesthesia: Long-acting (4-6 hours) anesthetics used. The analgesic levels attained during this period are directly associated to the localized infiltration of local anesthetics and therefore cary significant diagnostic value as to the etiological location or origin of the pain. Results: Relief during the next 4 to 6 hour after the procedure: 100 % (Short Term Relief) Interpretative note: Complete relief confirms area to be the source of pain Long-Term Therapy: Steroids used. Results: Extended relief following procedure: 75 % (ongoing) Interpretative note: Long-term benefit would suggest an inflammatory etiology to the pain.         Long-Term Benefits:  Current Relief (Now): 75%  Interpretative note: Ongoing improvement of symptoms would suggest either persistent anti-inflammatory benefits or prolonged sympathectomy, in the case of sympathetically-mediated pain Interpretation of Results: These results would suggest this therapy to be effective in the management of this patient's condition  Laboratory Chemistry  Inflammation Markers No results found for: ESRSEDRATE, CRP Renal Function Lab Results  Component Value Date   BUN 21 06/16/2016   CREATININE 0.99 06/16/2016   GFRAA >60 10/08/2014   GFRNONAA >60 10/08/2014   Hepatic Function Lab Results  Component Value Date   AST 24 06/16/2016   ALT 7 06/16/2016   ALBUMIN 4.0 06/16/2016   Electrolytes Lab Results  Component Value Date   NA 133 (L) 06/16/2016   K 4.0 06/16/2016   CL 97 06/16/2016   CALCIUM 9.5 06/16/2016   MG 1.8 04/21/2013   Pain Modulating Vitamins Lab Results  Component Value Date   VITAMINB12 881 02/01/2016   Coagulation Parameters Lab Results  Component Value Date    PLT 259.0 07/27/2016   Cardiovascular Lab Results  Component Value Date   HGB 11.6 (L) 07/27/2016   HCT 34.0 (L) 07/27/2016    Note: Lab results reviewed.  Recent Diagnostic Imaging  No results found.  Meds  The patient has a current medication list which includes the following prescription(s): amlodipine, aspirin, calcium carbonate, carbidopa-levodopa, citalopram, cyclobenzaprine, gabapentin, gavilyte-n with flavor pack, hydrochlorothiazide, hydrocodone-acetaminophen, hydrocodone-acetaminophen, hydrocodone-acetaminophen, letrozole, losartan, lubiprostone, magnesium oxide, meloxicam, metoprolol succinate, multivitamin, omeprazole, and pravastatin.  Current Outpatient Prescriptions on File Prior to Visit  Medication Sig  . amLODipine (NORVASC) 2.5 MG tablet TAKE 1 TABLET BY MOUTH ONCE DAILY.  Marland Kitchen aspirin 81 MG EC tablet Take 81 mg by mouth daily as needed.    . Calcium Carbonate (CALCIUM 600) 1500 MG TABS Take 1 tablet by mouth daily.    . carbidopa-levodopa (SINEMET IR) 25-100 MG per tablet Take 1 tablet by mouth 3 (three) times daily.  . citalopram (CELEXA) 20 MG tablet Take 20 mg by mouth daily.  Marland Kitchen GAVILYTE-N WITH FLAVOR PACK 420 g solution   . hydrochlorothiazide (HYDRODIURIL) 25 MG tablet TAKE 1 TABLET BY MOUTH ONCE DAILY.  Marland Kitchen letrozole (FEMARA) 2.5 MG tablet Take 1 tablet (2.5 mg total) by mouth daily.  Marland Kitchen losartan (COZAAR) 100 MG tablet TAKE 1 TABLET BY MOUTH ONCE DAILY.  Marland Kitchen lubiprostone (AMITIZA) 24 MCG capsule TAKE 1 CAPSULE BY MOUTH ONCE DAILY WITH BREAKFAST.  . metoprolol succinate (TOPROL-XL) 25 MG 24 hr tablet TAKE 1 TABLET BY MOUTH TWICE DAILY  . Multiple Vitamin (MULTIVITAMIN)  tablet Take 1 tablet by mouth daily.  Marland Kitchen omeprazole (PRILOSEC) 20 MG capsule TAKE 1 CAPSULE BY MOUTH TWICE DAILY FOR REFLUX.  . pravastatin (PRAVACHOL) 20 MG tablet TAKE 1 TABLET BY MOUTH ONCE DAILY.   No current facility-administered medications on file prior to visit.     ROS  Constitutional:  Denies any fever or chills Gastrointestinal: No reported hemesis, hematochezia, vomiting, or acute GI distress Musculoskeletal: Denies any acute onset joint swelling, redness, loss of ROM, or weakness Neurological: No reported episodes of acute onset apraxia, aphasia, dysarthria, agnosia, amnesia, paralysis, loss of coordination, or loss of consciousness  Allergies  Wanda Martinez is allergic to vesicare [solifenacin].  [place]  Medical:  Wanda Martinez  has a past medical history of Anemia; Arm pain (07/26/2015); Arthritis; Arthritis, degenerative (03/26/2014); Back pain (11/01/2013); Breast cancer ([place]); Breast cancer [place]); Chronic cystitis; Degeneration of intervertebral disc of lumbosacral region (03/26/2014); Enthesopathy of hip (03/26/2014); GERD (gastroesophageal reflux disease); Hiatal hernia; HTN (hypertension); LBP (low back pain) (03/26/2014); Neck pain (11/01/2013); Parkinson disease ([place]); and Urinary incontinence. Family: family history includes Colon polyps in her father; Diabetes in her father; Parkinson's disease in her mother; Stroke in her father and mother. Surgical:  has a past surgical history that includes Breast biopsy (2013); Tonsillectomy and adenoidectomy (79); Vesicovaginal fistula closure w/ TAH (1983); Breast surgery (1986); Breast enhancement surgery (1987); Breast implant removal; Breast implant removal (Right, 08/29/2012); Mastectomy (08/2012); Abdominal hysterectomy; and Colonoscopy with propofol (N/A, 09/13/2016). Tobacco:  reports that she has never smoked. She has never used smokeless tobacco. Alcohol:  reports that she does not drink alcohol. Drug:  reports that she does not use drugs.  Constitutional Exam  General appearance: Well nourished, well developed, and well hydrated. In no acute distress Vitals:   09/14/16 0849  BP: 128/78  Pulse: 64  Resp: 16  Temp: 98.3 F (36.8 C)  SpO2: 100%  Weight: 179 lb (81.2 kg)  Height: 5\' 4"  (1.626 m)  BMI Assessment: Estimated body  mass index is 30.73 kg/m as calculated from the following:   Height as of this encounter: 5\' 4"  (1.626 m).   Weight as of this encounter: 179 lb (81.2 kg).   BMI interpretation:           BMI Readings from Last 4 Encounters:  09/14/16 30.73 kg/m  09/13/16 30.73 kg/m  08/17/16 30.90 kg/m  08/02/16 30.90 kg/m   Wt Readings from Last 4 Encounters:  09/14/16 179 lb (81.2 kg)  09/13/16 179 lb (81.2 kg)  08/17/16 180 lb (81.6 kg)  08/02/16 180 lb (81.6 kg)  Psych/Mental status: Alert and oriented x 3 (person, place, & time) Eyes: PERLA Respiratory: No evidence of acute respiratory distress  Cervical Spine Exam  Inspection: No masses, redness, or swelling Alignment: Symmetrical Functional ROM: Unrestricted ROM Stability: No instability detected Muscle strength & Tone: Functionally intact Sensory: Unimpaired Palpation: Non-contributory  Upper Extremity (UE) Exam    Side: Right upper extremity  Side: Left upper extremity  Inspection: No masses, redness, swelling, or asymmetry  Inspection: No masses, redness, swelling, or asymmetry  Functional ROM: Unrestricted ROM         Functional ROM: Unrestricted ROM          Muscle strength & Tone: Functionally intact  Muscle strength & Tone: Functionally intact  Sensory: Unimpaired  Sensory: Unimpaired  Palpation: Non-contributory  Palpation: Non-contributory   Thoracic Spine Exam  Inspection: No masses, redness, or swelling Alignment: Symmetrical Functional ROM: Unrestricted ROM Stability: No instability detected  Sensory: Unimpaired Muscle strength & Tone: Functionally intact Palpation: Non-contributory  Lumbar Spine Exam  Inspection: No masses, redness, or swelling Alignment: Symmetrical Functional ROM: Unrestricted ROM Stability: No instability detected Muscle strength & Tone: Functionally intact Sensory: Unimpaired Palpation: Non-contributory Provocative Tests: Lumbar Hyperextension and rotation test: evaluation deferred  today       Patrick's Maneuver: evaluation deferred today              Gait & Posture Assessment  Ambulation: Unassisted Gait: Relatively normal for age and body habitus Posture: WNL   Lower Extremity Exam    Side: Right lower extremity  Side: Left lower extremity  Inspection: No masses, redness, swelling, or asymmetry  Inspection: No masses, redness, swelling, or asymmetry  Functional ROM: Unrestricted ROM          Functional ROM: Unrestricted ROM          Muscle strength & Tone: Functionally intact  Muscle strength & Tone: Functionally intact  Sensory: Unimpaired  Sensory: Unimpaired  Palpation: Non-contributory  Palpation: Non-contributory   Assessment  Primary Diagnosis & Pertinent Problem List: The primary encounter diagnosis was Chronic pain. Diagnoses of Diffuse myofascial pain syndrome, Neurogenic pain, Primary osteoarthritis involving multiple joints, Chronic low back pain (Location of Primary Source of Pain) (Bilateral) (R>L), and Lumbar facet syndrome (Location of Primary Source of Pain) (Bilateral) (R>L) were also pertinent to this visit.  Visit Diagnosis: 1. Chronic pain   2. Diffuse myofascial pain syndrome   3. Neurogenic pain   4. Primary osteoarthritis involving multiple joints   5. Chronic low back pain (Location of Primary Source of Pain) (Bilateral) (R>L)   6. Lumbar facet syndrome (Location of Primary Source of Pain) (Bilateral) (R>L)    Plan of Care   Problem List Items Addressed This Visit      High   Chronic low back pain (Location of Primary Source of Pain) (Bilateral) (R>L) (Chronic)   Relevant Medications   cyclobenzaprine (FLEXERIL) 10 MG tablet (Start on 10/31/2016)   meloxicam (MOBIC) 7.5 MG tablet (Start on 10/31/2016)   HYDROcodone-acetaminophen (NORCO/VICODIN) 5-325 MG tablet (Start on 10/31/2016)   HYDROcodone-acetaminophen (NORCO/VICODIN) 5-325 MG tablet (Start on 11/30/2016)   HYDROcodone-acetaminophen (NORCO/VICODIN) 5-325 MG tablet (Start  on 12/30/2016)   Other Relevant Orders   LUMBAR FACET(MEDIAL BRANCH NERVE BLOCK) MBNB   Chronic pain - Primary (Chronic)   Relevant Medications   magnesium oxide (MAGNESIUM-OXIDE) 400 (241.3 Mg) MG tablet (Start on 10/31/2016)   cyclobenzaprine (FLEXERIL) 10 MG tablet (Start on 10/31/2016)   gabapentin (NEURONTIN) 300 MG capsule (Start on 10/31/2016)   meloxicam (MOBIC) 7.5 MG tablet (Start on 10/31/2016)   HYDROcodone-acetaminophen (NORCO/VICODIN) 5-325 MG tablet (Start on 10/31/2016)   HYDROcodone-acetaminophen (NORCO/VICODIN) 5-325 MG tablet (Start on 11/30/2016)   HYDROcodone-acetaminophen (NORCO/VICODIN) 5-325 MG tablet (Start on 12/30/2016)   Diffuse myofascial pain syndrome (Chronic)   Relevant Medications   cyclobenzaprine (FLEXERIL) 10 MG tablet (Start on 10/31/2016)   meloxicam (MOBIC) 7.5 MG tablet (Start on 10/31/2016)   Lumbar facet syndrome (Location of Primary Source of Pain) (Bilateral) (R>L) (Chronic)   Relevant Medications   cyclobenzaprine (FLEXERIL) 10 MG tablet (Start on 10/31/2016)   meloxicam (MOBIC) 7.5 MG tablet (Start on 10/31/2016)   HYDROcodone-acetaminophen (NORCO/VICODIN) 5-325 MG tablet (Start on 10/31/2016)   HYDROcodone-acetaminophen (NORCO/VICODIN) 5-325 MG tablet (Start on 11/30/2016)   HYDROcodone-acetaminophen (NORCO/VICODIN) 5-325 MG tablet (Start on 12/30/2016)   Other Relevant Orders   LUMBAR FACET(MEDIAL BRANCH NERVE BLOCK) MBNB   Neurogenic pain (Chronic)   Relevant  Medications   gabapentin (NEURONTIN) 300 MG capsule (Start on 10/31/2016)   Osteoarthritis (Chronic)   Relevant Medications   cyclobenzaprine (FLEXERIL) 10 MG tablet (Start on 10/31/2016)   meloxicam (MOBIC) 7.5 MG tablet (Start on 10/31/2016)   HYDROcodone-acetaminophen (NORCO/VICODIN) 5-325 MG tablet (Start on 10/31/2016)   HYDROcodone-acetaminophen (NORCO/VICODIN) 5-325 MG tablet (Start on 11/30/2016)   HYDROcodone-acetaminophen (NORCO/VICODIN) 5-325 MG tablet (Start on  12/30/2016)    Other Visit Diagnoses   None.    Pharmacotherapy (Medications Ordered): Meds ordered this encounter  Medications  . magnesium oxide (MAGNESIUM-OXIDE) 400 (241.3 Mg) MG tablet    Sig: Take 1 tablet (400 mg total) by mouth daily.    Dispense:  30 tablet    Refill:  2    Do not place this medication, or any other prescription from our practice, on "Automatic Refill". Patient may have prescription filled one day early if pharmacy is closed on scheduled refill date.  . cyclobenzaprine (FLEXERIL) 10 MG tablet    Sig: Take 1 tablet (10 mg total) by mouth at bedtime.    Dispense:  30 tablet    Refill:  2    Do not place this medication, or any other prescription from our practice, on "Automatic Refill". Patient may have prescription filled one day early if pharmacy is closed on scheduled refill date.  . gabapentin (NEURONTIN) 300 MG capsule    Sig: Take 1 capsule (300 mg total) by mouth 2 (two) times daily.    Dispense:  60 capsule    Refill:  2    Do not place this medication, or any other prescription from our practice, on "Automatic Refill". Patient may have prescription filled one day early if pharmacy is closed on scheduled refill date.  . meloxicam (MOBIC) 7.5 MG tablet    Sig: Take 1 tablet (7.5 mg total) by mouth daily.    Dispense:  30 tablet    Refill:  2    Do not place this medication, or any other prescription from our practice, on "Automatic Refill". Patient may have prescription filled one day early if pharmacy is closed on scheduled refill date.  Marland Kitchen HYDROcodone-acetaminophen (NORCO/VICODIN) 5-325 MG tablet    Sig: Take 1 tablet by mouth every 8 (eight) hours as needed for severe pain.    Dispense:  90 tablet    Refill:  0    Do not place this medication, or any other prescription from our practice, on "Automatic Refill". Patient may have prescription filled one day early if pharmacy is closed on scheduled refill date. Do not fill until: 10/31/16 To last until:  11/30/16  . HYDROcodone-acetaminophen (NORCO/VICODIN) 5-325 MG tablet    Sig: Take 1 tablet by mouth every 8 (eight) hours as needed for severe pain.    Dispense:  90 tablet    Refill:  0    Do not place this medication, or any other prescription from our practice, on "Automatic Refill". Patient may have prescription filled one day early if pharmacy is closed on scheduled refill date. Do not fill until: 11/30/16 To last until: 12/31/15  . HYDROcodone-acetaminophen (NORCO/VICODIN) 5-325 MG tablet    Sig: Take 1 tablet by mouth every 8 (eight) hours as needed for severe pain.    Dispense:  90 tablet    Refill:  0    Do not place this medication, or any other prescription from our practice, on "Automatic Refill". Patient may have prescription filled one day early if pharmacy is closed on scheduled refill  date. Do not fill until: 12/30/16 To last until: 01/29/17   New Prescriptions   No medications on file   Medications administered during this visit: Wanda Martinez had no medications administered during this visit. Lab-work, Procedure(s), & Referral(s) Ordered: Orders Placed This Encounter  Procedures  . LUMBAR FACET(MEDIAL BRANCH NERVE BLOCK) MBNB   Imaging & Referral(s) Ordered: None  Interventional Therapies: Scheduled:  None at this time.   Considering: Diagnostic right-sided lumbar facet block under fluoroscopic guidance and IV sedation.  Palliative right-sided lumbar facet radiofrequency ablation under fluoroscopic guidance and IV sedation.  Palliative Right suprascapular muscle trigger point injection, without fluoroscopy or IV sedation.  Diagnostic bilateral cervical facet block under fluoroscopic guidance and IV sedation.  Possible bilateral cervical facet radiofrequency ablation under fluoroscopic guidance and IV sedation.  Diagnostic right-sided cervical epidural steroid injection under fluoroscopic guidance, with or without IV sedation.  Diagnostic intra-articular hip  joint injection under fluoroscopic guidance, without IV sedation.  Possible hip joint radiofrequency ablation under fluoroscopic guidance and IV sedation.    PRN Procedures: Diagnostic right-sided lumbar facet block under fluoroscopic guidance and IV sedation.  Palliative right-sided lumbar facet radiofrequency ablation under fluoroscopic guidance and IV sedation.  Palliative Right suprascapular muscle trigger point injection, without fluoroscopy or IV sedation.  Diagnostic bilateral cervical facet block under fluoroscopic guidance and IV sedation.  Diagnostic right-sided cervical epidural steroid injection under fluoroscopic guidance, with or without IV sedation.  Diagnostic intra-articular hip joint injection under fluoroscopic guidance, without IV sedation.    Requested PM Follow-up: Return in about 4 months (around 01/18/2017) for Med-Mgmt, In addition, (PRN) Procedure.  Future Appointments Date Time Provider [place]  09/26/2016 11:30 AM Lloyd Huger, MD [facility] None  10/02/2016 2:00 PM Einar Pheasant, MD [facility] None  05/02/2017 11:00 AM Noreene Filbert, MD [facility] None    Primary Care Physician: Einar Pheasant, MD Location: [facility] Note by: Kathlen Brunswick. Dossie Arbour, M.D, DABA, DABAPM, DABPM, DABIPP, FIPP  Pain Score Disclaimer: We use the NRS-11 scale. This is a self-reported, subjective measurement of pain severity with only modest accuracy. It is used primarily to identify changes within a particular patient. It must be understood that outpatient pain scales are significantly less accurate that those used for research, where they can be applied under ideal controlled circumstances with minimal exposure to variables. In reality, the score is likely to be a combination of pain intensity and pain affect, where pain affect describes the degree of emotional arousal or changes in action readiness caused by the sensory experience of  pain. Factors such as social and work situation, setting, emotional state, anxiety levels, expectation, and prior pain experience may influence pain perception and show large inter-individual differences that may also be affected by time variables.  Patient instructions provided during this appointment: There are no Patient Instructions on file for this visit.

## 2016-09-22 ENCOUNTER — Encounter: Payer: Self-pay | Admitting: Internal Medicine

## 2016-09-24 NOTE — Progress Notes (Signed)
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  INTERVAL HISTORY: Patient returns to clinic today for routine 6 month followup. Her Parkinson's symptoms continue to be well controlled with her current medication regimen. She is tolerating letrozole without significant side effects. She has no new neurologic complaints.  She denies any recent fevers.  She has a good appetite and denies weight loss.  She denies any chest pain or shortness of breath.  She denies any nausea, vomiting, constipation, or diarrhea.  She has no urinary complaints.  Patient offers no further specific complaints today.   REVIEW OF SYSTEMS:   Review of Systems  Constitutional: Negative.  Negative for fever, malaise/fatigue and weight loss.  Respiratory: Negative.  Negative for cough and shortness of breath.   Cardiovascular: Negative.  Negative for chest pain and leg swelling.  Gastrointestinal: Negative.  Negative for abdominal pain.  Genitourinary: Negative.   Musculoskeletal: Positive for joint pain.  Neurological: Positive for tremors. Negative for weakness.  Psychiatric/Behavioral: Negative.  Negative for depression. The patient is not nervous/anxious.     As per HPI. Otherwise, a complete review of systems is negative.  PAST MEDICAL HISTORY: Past Medical History:  Diagnosis Date  . Anemia   . Arm pain 07/26/2015  . Arthritis   . Arthritis, degenerative 03/26/2014  . Back pain 11/01/2013  . Breast cancer ([place])    Masectomy - left - 1986   . Breast cancer [facility])    Mastectomy-right -2014  . Chronic cystitis   . Degeneration of intervertebral disc of lumbosacral region 03/26/2014  . Enthesopathy of hip 03/26/2014   . GERD (gastroesophageal reflux disease)   . Hiatal hernia   . HTN (hypertension)   . LBP (low back pain) 03/26/2014  . Neck pain 11/01/2013  . Parkinson disease ([place])   . Urinary incontinence    mixed     PAST SURGICAL HISTORY: Past Surgical History:  Procedure Laterality Date  . ABDOMINAL HYSTERECTOMY    . BREAST BIOPSY  2013  . BREAST ENHANCEMENT SURGERY  1987  . BREAST IMPLANT REMOVAL    . BREAST IMPLANT REMOVAL Right 08/29/2012  . BREAST SURGERY  1986   s/p left mastectomy  . COLONOSCOPY WITH PROPOFOL N/A 09/13/2016   Procedure: COLONOSCOPY WITH PROPOFOL;  Surgeon: Manya Silvas, MD;  Location: [facility] ENDOSCOPY;  Service: Endoscopy;  Laterality: N/A;  . MASTECTOMY  08/2012   right  . TONSILLECTOMY AND ADENOIDECTOMY  81  . VESICOVAGINAL FISTULA CLOSURE W/ TAH  1983    FAMILY HISTORY Family History  Problem Relation Age of Onset  . Diabetes Father   . Stroke Father   . Colon polyps Father   . Stroke Mother   . Parkinson's disease Mother        ADVANCED DIRECTIVES:    HEALTH MAINTENANCE: Social History  Substance Use Topics  . Smoking status: Never Smoker  . Smokeless tobacco: Never Used     Comment: tobacco use - no  . Alcohol use No     Allergies  Allergen Reactions  . Vesicare [Solifenacin] Other (See Comments)    Constipation Constipation    Current Outpatient Prescriptions  Medication Sig Dispense Refill  . amLODipine (NORVASC) 2.5 MG tablet TAKE 1 TABLET BY MOUTH  ONCE DAILY. 30 tablet 11  . aspirin 81 MG EC tablet Take 81 mg by mouth daily as needed.      . Calcium Carbonate (CALCIUM 600) 1500 MG TABS Take 1 tablet by mouth daily.      . carbidopa-levodopa (SINEMET IR) 25-100 MG per tablet Take 1 tablet by mouth 3 (three) times daily.    . citalopram (CELEXA) 20 MG tablet Take 20 mg by mouth daily.    Derrill Memo ON 10/31/2016] cyclobenzaprine (FLEXERIL) 10 MG tablet Take 1 tablet (10 mg total) by mouth at bedtime. 30 tablet 2  . [START ON  10/31/2016] gabapentin (NEURONTIN) 300 MG capsule Take 1 capsule (300 mg total) by mouth 2 (two) times daily. 60 capsule 2  . GAVILYTE-N WITH FLAVOR PACK 420 g solution     . hydrochlorothiazide (HYDRODIURIL) 25 MG tablet TAKE 1 TABLET BY MOUTH ONCE DAILY. 30 tablet 11  . [START ON 10/31/2016] HYDROcodone-acetaminophen (NORCO/VICODIN) 5-325 MG tablet Take 1 tablet by mouth every 8 (eight) hours as needed for severe pain. 90 tablet 0  . letrozole (FEMARA) 2.5 MG tablet Take 1 tablet (2.5 mg total) by mouth daily. 90 tablet 0  . losartan (COZAAR) 100 MG tablet TAKE 1 TABLET BY MOUTH ONCE DAILY. 30 tablet 11  . lubiprostone (AMITIZA) 24 MCG capsule TAKE 1 CAPSULE BY MOUTH ONCE DAILY WITH BREAKFAST.    Derrill Memo ON 10/31/2016] magnesium oxide (MAGNESIUM-OXIDE) 400 (241.3 Mg) MG tablet Take 1 tablet (400 mg total) by mouth daily. 30 tablet 2  . [START ON 10/31/2016] meloxicam (MOBIC) 7.5 MG tablet Take 1 tablet (7.5 mg total) by mouth daily. 30 tablet 2  . metoprolol succinate (TOPROL-XL) 25 MG 24 hr tablet TAKE 1 TABLET BY MOUTH TWICE DAILY 60 tablet 11  . Multiple Vitamin (MULTIVITAMIN) tablet Take 1 tablet by mouth daily.    Marland Kitchen omeprazole (PRILOSEC) 20 MG capsule TAKE 1 CAPSULE BY MOUTH TWICE DAILY FOR REFLUX. 60 capsule 11  . pravastatin (PRAVACHOL) 20 MG tablet TAKE 1 TABLET BY MOUTH ONCE DAILY. 90 tablet 0  . [START ON 11/30/2016] HYDROcodone-acetaminophen (NORCO/VICODIN) 5-325 MG tablet Take 1 tablet by mouth every 8 (eight) hours as needed for severe pain. (Patient not taking: Reported on 09/26/2016) 90 tablet 0  . [START ON 12/30/2016] HYDROcodone-acetaminophen (NORCO/VICODIN) 5-325 MG tablet Take 1 tablet by mouth every 8 (eight) hours as needed for severe pain. (Patient not taking: Reported on 09/26/2016) 90 tablet 0   No current facility-administered medications for this visit.     OBJECTIVE: Vitals:   09/26/16 1136  BP: 121/73  Pulse: 69  Resp: 18  Temp: 98.6 F (37 C)     Body mass  index is 31.48 kg/m.    ECOG FS:0 - Asymptomatic  General: Well-developed, well-nourished, no acute distress. Eyes: Pink conjunctiva, anicteric sclera. Breasts: Patient requested exam be deferred today. Lungs: Clear to auscultation bilaterally. Heart: Regular rate and rhythm. No rubs, murmurs, or gallops. Abdomen: Soft, nontender, nondistended. No organomegaly noted, normoactive bowel sounds. Musculoskeletal: No edema, cyanosis, or clubbing. Neuro: Alert, answering all questions appropriately. Cranial nerves grossly intact. Skin: No rashes or petechiae noted. Psych: Normal affect.   LAB RESULTS:  Lab Results  Component Value Date   NA 133 (L) 06/16/2016   K 4.0 06/16/2016   CL 97 06/16/2016   CO2 32 06/16/2016   GLUCOSE 87 06/16/2016   BUN 21 06/16/2016   CREATININE 0.99 06/16/2016   CALCIUM 9.5 06/16/2016   PROT 6.9 06/16/2016  ALBUMIN 4.0 06/16/2016   AST 24 06/16/2016   ALT 7 06/16/2016   ALKPHOS 57 06/16/2016   BILITOT 0.4 06/16/2016   GFRNONAA >60 10/08/2014   GFRAA >60 10/08/2014    Lab Results  Component Value Date   WBC 6.7 07/27/2016   NEUTROABS 4.3 07/27/2016   HGB 11.6 (L) 07/27/2016   HCT 34.0 (L) 07/27/2016   MCV 87.6 07/27/2016   PLT 259.0 07/27/2016     STUDIES: No results found.  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  PLAN:    1.  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 Continue letrozole completing 5 years of therapy in May 2019. Patient has had bilateral mastectomies, therefore no further mammograms are necessary. Patient finished her chemotherapy in January 2014. She did not require XRT given her bilateral mastectomies. Return to clinic in 6 months for routine evaluation. 2. Osteopenia: Patient's most recent bone mineral density on June 21, 2016 was reported as -2.3. This is  slightly worse than one year prior. Continue Calcium and Vitamin D and consider Fosamax in the future. Repeat in July 2018.  3.  Tremors/Parkinson's: Stable. Treatment per neurology.   Patient expressed understanding and was in agreement with this plan. She also understands that She can call clinic at any time with any questions, concerns, or complaints.   Breast cancer [facility])   Staging form: Breast, AJCC 7th Edition     Clinical stage from 09/22/2015: Stage IIA (T1c, N1, M0) - Signed by Lloyd Huger, MD on 09/22/2015   Lloyd Huger, MD   10/01/2016 4:32 PM

## 2016-09-26 ENCOUNTER — Inpatient Hospital Stay: Payer: Medicare Other | Attending: Oncology | Admitting: Oncology

## 2016-09-26 VITALS — BP 121/73 | HR 69 | Temp 98.6°F | Resp 18 | Wt 183.4 lb

## 2016-09-26 DIAGNOSIS — K449 Diaphragmatic hernia without obstruction or gangrene: Secondary | ICD-10-CM | POA: Diagnosis not present

## 2016-09-26 DIAGNOSIS — C50211 Malignant neoplasm of upper-inner quadrant of right female breast: Secondary | ICD-10-CM

## 2016-09-26 DIAGNOSIS — M8580 Other specified disorders of bone density and structure, unspecified site: Secondary | ICD-10-CM | POA: Diagnosis not present

## 2016-09-26 DIAGNOSIS — Z7982 Long term (current) use of aspirin: Secondary | ICD-10-CM | POA: Diagnosis not present

## 2016-09-26 DIAGNOSIS — M5137 Other intervertebral disc degeneration, lumbosacral region: Secondary | ICD-10-CM | POA: Diagnosis not present

## 2016-09-26 DIAGNOSIS — K219 Gastro-esophageal reflux disease without esophagitis: Secondary | ICD-10-CM | POA: Diagnosis not present

## 2016-09-26 DIAGNOSIS — Z853 Personal history of malignant neoplasm of breast: Secondary | ICD-10-CM | POA: Diagnosis not present

## 2016-09-26 DIAGNOSIS — Z79899 Other long term (current) drug therapy: Secondary | ICD-10-CM

## 2016-09-26 DIAGNOSIS — Z862 Personal history of diseases of the blood and blood-forming organs and certain disorders involving the immune mechanism: Secondary | ICD-10-CM

## 2016-09-26 DIAGNOSIS — R32 Unspecified urinary incontinence: Secondary | ICD-10-CM | POA: Diagnosis not present

## 2016-09-26 DIAGNOSIS — Z170 Estrogen receptor positive status [ER+]: Secondary | ICD-10-CM

## 2016-09-26 DIAGNOSIS — G20 Parkinson's disease: Secondary | ICD-10-CM | POA: Diagnosis not present

## 2016-09-26 DIAGNOSIS — M129 Arthropathy, unspecified: Secondary | ICD-10-CM

## 2016-09-26 DIAGNOSIS — Z9013 Acquired absence of bilateral breasts and nipples: Secondary | ICD-10-CM

## 2016-09-26 DIAGNOSIS — Z79811 Long term (current) use of aromatase inhibitors: Secondary | ICD-10-CM | POA: Diagnosis not present

## 2016-09-26 NOTE — Progress Notes (Signed)
Complains of chronic back pain. 

## 2016-10-02 ENCOUNTER — Encounter: Payer: Self-pay | Admitting: Internal Medicine

## 2016-10-02 ENCOUNTER — Ambulatory Visit (INDEPENDENT_AMBULATORY_CARE_PROVIDER_SITE_OTHER): Payer: Medicare Other | Admitting: Internal Medicine

## 2016-10-02 DIAGNOSIS — E7800 Pure hypercholesterolemia, unspecified: Secondary | ICD-10-CM | POA: Diagnosis not present

## 2016-10-02 DIAGNOSIS — G20 Parkinson's disease: Secondary | ICD-10-CM

## 2016-10-02 DIAGNOSIS — D649 Anemia, unspecified: Secondary | ICD-10-CM

## 2016-10-02 DIAGNOSIS — E669 Obesity, unspecified: Secondary | ICD-10-CM

## 2016-10-02 DIAGNOSIS — F329 Major depressive disorder, single episode, unspecified: Secondary | ICD-10-CM

## 2016-10-02 DIAGNOSIS — Z23 Encounter for immunization: Secondary | ICD-10-CM

## 2016-10-02 DIAGNOSIS — F32A Depression, unspecified: Secondary | ICD-10-CM

## 2016-10-02 DIAGNOSIS — I10 Essential (primary) hypertension: Secondary | ICD-10-CM

## 2016-10-02 NOTE — Progress Notes (Signed)
Pre visit review using our clinic review tool, if applicable. No additional management support is needed unless otherwise documented below in the visit note. 

## 2016-10-02 NOTE — Progress Notes (Signed)
Patient ID: Wanda Martinez, female   DOB: [birth year], 81 y.o.   MRN: [id number]   Subjective:    Patient ID: Wanda Martinez, female    DOB: [birth year], 81 y.o.   MRN: [id number]  HPI  Patient here for a scheduled follow up.  She reports she is doing relatively well.  No chest pain.  Breathing stable.   No acid reflux reported.  No abdominal pain or cramping.  Bowels stable.  Feels her parkinsons is stable.  Followed by neurology.    Past Medical History:  Diagnosis Date  . Anemia   . Arm pain 07/26/2015  . Arthritis   . Arthritis, degenerative 03/26/2014  . Back pain 11/01/2013  . Breast cancer ([place])    Masectomy - left - 1986   . Breast cancer [facility])    Mastectomy-right -2014  . Chronic cystitis   . Degeneration of intervertebral disc of lumbosacral region 03/26/2014  . Enthesopathy of hip 03/26/2014  . GERD (gastroesophageal reflux disease)   . Hiatal hernia   . HTN (hypertension)   . LBP (low back pain) 03/26/2014  . Neck pain 11/01/2013  . Parkinson disease ([place])   . Urinary incontinence    mixed    Past Surgical History:  Procedure Laterality Date  . ABDOMINAL HYSTERECTOMY    . BREAST BIOPSY  2013  . BREAST ENHANCEMENT SURGERY  1987  . BREAST IMPLANT REMOVAL    . BREAST IMPLANT REMOVAL Right 08/29/2012  . BREAST SURGERY  1986   s/p left mastectomy  . COLONOSCOPY WITH PROPOFOL N/A 09/13/2016   Procedure: COLONOSCOPY WITH PROPOFOL;  Surgeon: Manya Silvas, MD;  Location: [facility] ENDOSCOPY;  Service: Endoscopy;  Laterality: N/A;  . MASTECTOMY  08/2012   right  . TONSILLECTOMY AND ADENOIDECTOMY  81  . VESICOVAGINAL FISTULA CLOSURE W/ TAH  1983   Family History  Problem Relation Age of Onset  . Diabetes Father   . Stroke Father   . Colon polyps Father   . Stroke Mother   . Parkinson's disease Mother    Social History   Social History  . Marital status: Married    Spouse name: N/A  . Number of children: 3  . Years of education: N/A   Social History Main  Topics  . Smoking status: Never Smoker  . Smokeless tobacco: Never Used     Comment: tobacco use - no  . Alcohol use No  . Drug use: No  . Sexual activity: Not Asked   Other Topics Concern  . None   Social History Narrative  . None    Outpatient Encounter Prescriptions as of 10/02/2016  Medication Sig  . amLODipine (NORVASC) 2.5 MG tablet TAKE 1 TABLET BY MOUTH ONCE DAILY.  Marland Kitchen aspirin 81 MG EC tablet Take 81 mg by mouth daily as needed.    . Calcium Carbonate (CALCIUM 600) 1500 MG TABS Take 1 tablet by mouth daily.    . carbidopa-levodopa (SINEMET IR) 25-100 MG per tablet Take 1 tablet by mouth 3 (three) times daily.  . citalopram (CELEXA) 20 MG tablet Take 20 mg by mouth daily.  Derrill Memo ON 10/31/2016] cyclobenzaprine (FLEXERIL) 10 MG tablet Take 1 tablet (10 mg total) by mouth at bedtime.  Derrill Memo ON 10/31/2016] gabapentin (NEURONTIN) 300 MG capsule Take 1 capsule (300 mg total) by mouth 2 (two) times daily.  Marland Kitchen GAVILYTE-N WITH FLAVOR PACK 420 g solution   . hydrochlorothiazide (HYDRODIURIL) 25 MG tablet TAKE 1 TABLET BY MOUTH ONCE  DAILY.  . [START ON 10/31/2016] HYDROcodone-acetaminophen (NORCO/VICODIN) 5-325 MG tablet Take 1 tablet by mouth every 8 (eight) hours as needed for severe pain.  Derrill Memo ON 11/30/2016] HYDROcodone-acetaminophen (NORCO/VICODIN) 5-325 MG tablet Take 1 tablet by mouth every 8 (eight) hours as needed for severe pain.  Derrill Memo ON 12/30/2016] HYDROcodone-acetaminophen (NORCO/VICODIN) 5-325 MG tablet Take 1 tablet by mouth every 8 (eight) hours as needed for severe pain.  Marland Kitchen letrozole (FEMARA) 2.5 MG tablet Take 1 tablet (2.5 mg total) by mouth daily.  Marland Kitchen losartan (COZAAR) 100 MG tablet TAKE 1 TABLET BY MOUTH ONCE DAILY.  Marland Kitchen lubiprostone (AMITIZA) 24 MCG capsule TAKE 1 CAPSULE BY MOUTH ONCE DAILY WITH BREAKFAST.  Derrill Memo ON 10/31/2016] magnesium oxide (MAGNESIUM-OXIDE) 400 (241.3 Mg) MG tablet Take 1 tablet (400 mg total) by mouth daily.  Derrill Memo ON 10/31/2016]  meloxicam (MOBIC) 7.5 MG tablet Take 1 tablet (7.5 mg total) by mouth daily.  . metoprolol succinate (TOPROL-XL) 25 MG 24 hr tablet TAKE 1 TABLET BY MOUTH TWICE DAILY  . Multiple Vitamin (MULTIVITAMIN) tablet Take 1 tablet by mouth daily.  Marland Kitchen omeprazole (PRILOSEC) 20 MG capsule TAKE 1 CAPSULE BY MOUTH TWICE DAILY FOR REFLUX.  . pravastatin (PRAVACHOL) 20 MG tablet TAKE 1 TABLET BY MOUTH ONCE DAILY.   No facility-administered encounter medications on file as of 10/02/2016.     Review of Systems  Constitutional: Negative for appetite change and unexpected weight change.  HENT: Negative for congestion and sinus pressure.   Respiratory: Negative for cough, chest tightness and shortness of breath.   Cardiovascular: Negative for chest pain, palpitations and leg swelling.  Gastrointestinal: Negative for abdominal pain, diarrhea, nausea and vomiting.  Genitourinary: Negative for difficulty urinating and dysuria.  Musculoskeletal: Negative for back pain and joint swelling.  Skin: Negative for color change and rash.  Neurological: Negative for dizziness, light-headedness and headaches.  Psychiatric/Behavioral: Negative for agitation and dysphoric mood.       Objective:     Blood pressure rechecked by me:  132/82  Physical Exam  Constitutional: She appears well-developed and well-nourished. No distress.  HENT:  Nose: Nose normal.  Mouth/Throat: Oropharynx is clear and moist.  Neck: Neck supple. No thyromegaly present.  Cardiovascular: Normal rate and regular rhythm.   Pulmonary/Chest: Breath sounds normal. No respiratory distress. She has no wheezes.  Abdominal: Soft. Bowel sounds are normal. There is no tenderness.  Musculoskeletal: She exhibits no edema or tenderness.  Lymphadenopathy:    She has no cervical adenopathy.  Skin: No rash noted. No erythema.  Psychiatric: She has a normal mood and affect. Her behavior is normal.    BP 132/82   Pulse 68   Temp 98.7 F (37.1 C) (Oral)    Ht 5\' 4"  (1.626 m)   Wt 186 lb 9.6 oz (84.6 kg)   LMP 12/18/1981   SpO2 98%   BMI 32.03 kg/m  Wt Readings from Last 3 Encounters:  10/02/16 186 lb 9.6 oz (84.6 kg)  09/26/16 183 lb 6.8 oz (83.2 kg)  09/14/16 179 lb (81.2 kg)     Lab Results  Component Value Date   WBC 6.7 07/27/2016   HGB 11.6 (L) 07/27/2016   HCT 34.0 (L) 07/27/2016   PLT 259.0 07/27/2016   GLUCOSE 87 06/16/2016   CHOL 141 06/16/2016   TRIG 174.0 (H) 06/16/2016   HDL 36.10 (L) 06/16/2016   LDLDIRECT 90.0 02/17/2015   LDLCALC 70 06/16/2016   ALT 7 06/16/2016   AST 24 06/16/2016  NA 133 (L) 06/16/2016   K 4.0 06/16/2016   CL 97 06/16/2016   CREATININE 0.99 06/16/2016   BUN 21 06/16/2016   CO2 32 06/16/2016   TSH 2.69 02/01/2016       Assessment & Plan:   Problem List Items Addressed This Visit    Anemia    Colonoscopy 09/13/16 - diverticulosis and internal hemorrhoids.  hgb slightly decreased on last check.  Recheck cbc with next labs.        Relevant Orders   CBC with Differential/Platelet   Ferritin   Clinical depression    On citalopram.  Follow.       Essential hypertension    Blood pressure elevated initially.  Recheck improved 132/82.  Follow.  Same medication regimen.  Follow metabolic panel.        Relevant Orders   Basic metabolic panel   Hypercholesterolemia    On pravastatin.  Low cholesterol diet and exercise. Follow lipid panel and liver function tests.        Relevant Orders   Hepatic function panel   Lipid panel   Obesity (BMI 30-39.9)    Diet and exercise.  Follow.       Parkinsons ([place])    Followed by neurology.  Stable.         Other Visit Diagnoses    Encounter for immunization       Relevant Orders   Flu vaccine HIGH DOSE PF (Completed)       Einar Pheasant, MD

## 2016-10-08 ENCOUNTER — Encounter: Payer: Self-pay | Admitting: Internal Medicine

## 2016-10-08 NOTE — Assessment & Plan Note (Signed)
Diet and exercise.  Follow.  

## 2016-10-08 NOTE — Assessment & Plan Note (Signed)
On citalopram.  Follow.

## 2016-10-08 NOTE — Assessment & Plan Note (Signed)
On pravastatin.  Low cholesterol diet and exercise.  Follow lipid panel and liver function tests.   

## 2016-10-08 NOTE — Assessment & Plan Note (Signed)
Followed by neurology.  Stable  

## 2016-10-08 NOTE — Assessment & Plan Note (Signed)
Colonoscopy 09/13/16 - diverticulosis and internal hemorrhoids.  hgb slightly decreased on last check.  Recheck cbc with next labs.

## 2016-10-08 NOTE — Assessment & Plan Note (Signed)
Blood pressure elevated initially.  Recheck improved 132/82.  Follow.  Same medication regimen.  Follow metabolic panel.

## 2016-10-16 ENCOUNTER — Other Ambulatory Visit: Payer: Self-pay | Admitting: Internal Medicine

## 2016-10-16 MED ORDER — PRAVASTATIN SODIUM 20 MG PO TABS: 20.0000 mg | ORAL_TABLET | Freq: Every day | ORAL | 0 refills | Status: DC

## 2016-10-18 ENCOUNTER — Encounter: Payer: Medicare Other | Admitting: Pain Medicine

## 2016-10-18 ENCOUNTER — Other Ambulatory Visit (INDEPENDENT_AMBULATORY_CARE_PROVIDER_SITE_OTHER): Payer: Medicare Other

## 2016-10-18 DIAGNOSIS — I10 Essential (primary) hypertension: Secondary | ICD-10-CM

## 2016-10-18 DIAGNOSIS — D649 Anemia, unspecified: Secondary | ICD-10-CM

## 2016-10-18 DIAGNOSIS — E7800 Pure hypercholesterolemia, unspecified: Secondary | ICD-10-CM | POA: Diagnosis not present

## 2016-10-18 LAB — LIPID PANEL
CHOL/HDL RATIO: 5
CHOLESTEROL: 180 mg/dL (ref 0–200)
HDL: 39.5 mg/dL (ref >39.00)
NONHDL: 140.02
TRIGLYCERIDES: 229 mg/dL — AB (ref 0.0–149.0)
VLDL: 45.8 mg/dL — AB (ref 0.0–40.0)

## 2016-10-18 LAB — HEPATIC FUNCTION PANEL
ALK PHOS: 71 U/L (ref 39–117)
ALT: 12 U/L (ref 0–35)
AST: 22 U/L (ref 0–37)
Albumin: 4.3 g/dL (ref 3.5–5.2)
Bilirubin, Direct: 0 mg/dL (ref 0.0–0.3)
TOTAL PROTEIN: 7.3 g/dL (ref 6.0–8.3)
Total Bilirubin: 0.4 mg/dL (ref 0.2–1.2)

## 2016-10-18 LAB — BASIC METABOLIC PANEL
BUN: 17 mg/dL (ref 6–23)
CALCIUM: 9.7 mg/dL (ref 8.4–10.5)
CO2: 33 mEq/L — ABNORMAL HIGH (ref 19–32)
Chloride: 98 mEq/L (ref 96–112)
Creatinine, Ser: 0.98 mg/dL (ref 0.40–1.20)
GFR: 58.19 mL/min — ABNORMAL LOW (ref >60.00)
Glucose, Bld: 85 mg/dL (ref 70–99)
Potassium: 4.6 mEq/L (ref 3.5–5.1)
Sodium: 137 mEq/L (ref 135–145)

## 2016-10-18 LAB — CBC WITH DIFFERENTIAL/PLATELET
Basophils Absolute: 0 10*3/uL (ref 0.0–0.1)
Basophils Relative: 0.9 % (ref 0.0–3.0)
EOS PCT: 6.3 % — AB (ref 0.0–5.0)
Eosinophils Absolute: 0.4 10*3/uL (ref 0.0–0.7)
HCT: 36.6 % (ref 36.0–46.0)
HEMOGLOBIN: 12.3 g/dL (ref 12.0–15.0)
Lymphocytes Relative: 25.1 % (ref 12.0–46.0)
Lymphs Abs: 1.4 10*3/uL (ref 0.7–4.0)
MCHC: 33.5 g/dL (ref 30.0–36.0)
MCV: 88 fl (ref 78.0–100.0)
MONOS PCT: 7.9 % (ref 3.0–12.0)
Monocytes Absolute: 0.4 10*3/uL (ref 0.1–1.0)
Neutro Abs: 3.3 10*3/uL (ref 1.4–7.7)
Neutrophils Relative %: 59.8 % (ref 43.0–77.0)
Platelets: 244 10*3/uL (ref 150.0–400.0)
RBC: 4.16 Mil/uL (ref 3.87–5.11)
RDW: 13.6 % (ref 11.5–15.5)
WBC: 5.6 10*3/uL (ref 4.0–10.5)

## 2016-10-18 LAB — FERRITIN: Ferritin: 51.7 ng/mL (ref 10.0–291.0)

## 2016-10-18 LAB — LDL CHOLESTEROL, DIRECT: LDL DIRECT: 100 mg/dL

## 2016-10-26 ENCOUNTER — Ambulatory Visit (INDEPENDENT_AMBULATORY_CARE_PROVIDER_SITE_OTHER): Payer: Medicare Other

## 2016-10-26 VITALS — BP 140/86 | HR 79 | Temp 98.2°F | Resp 14 | Ht 63.0 in | Wt 185.1 lb

## 2016-10-26 DIAGNOSIS — Z0000 Encounter for general adult medical examination without abnormal findings: Secondary | ICD-10-CM | POA: Diagnosis not present

## 2016-10-26 NOTE — Progress Notes (Addendum)
Subjective:   Wanda Martinez is a 81 y.o. female who presents for an Initial Medicare Annual Wellness Visit.  Review of Systems    No ROS.  Medicare Wellness Visit.  Cardiac Risk Factors include: advanced age (>28men, >82 women);hypertension     Objective:    Today's Vitals   10/26/16 1312  BP: 140/86  Pulse: 79  Resp: 14  Temp: 98.2 F (36.8 C)  TempSrc: Oral  SpO2: 95%  Weight: 185 lb 1.9 oz (84 kg)  Height: 5\' 3"  (1.6 m)  PainSc: 2    Body mass index is 32.79 kg/m.   Current Medications (verified) Outpatient Encounter Prescriptions as of 10/26/2016  Medication Sig  . amLODipine (NORVASC) 2.5 MG tablet TAKE 1 TABLET BY MOUTH ONCE DAILY.  Marland Kitchen aspirin 81 MG EC tablet Take 81 mg by mouth daily as needed.    . Calcium Carbonate (CALCIUM 600) 1500 MG TABS Take 1 tablet by mouth daily.    . carbidopa-levodopa (SINEMET IR) 25-100 MG per tablet Take 1 tablet by mouth 3 (three) times daily.  . citalopram (CELEXA) 20 MG tablet Take 20 mg by mouth daily.  Derrill Memo ON 10/31/2016] cyclobenzaprine (FLEXERIL) 10 MG tablet Take 1 tablet (10 mg total) by mouth at bedtime.  Derrill Memo ON 10/31/2016] gabapentin (NEURONTIN) 300 MG capsule Take 1 capsule (300 mg total) by mouth 2 (two) times daily.  Marland Kitchen GAVILYTE-N WITH FLAVOR PACK 420 g solution   . hydrochlorothiazide (HYDRODIURIL) 25 MG tablet TAKE 1 TABLET BY MOUTH ONCE DAILY.  Derrill Memo ON 10/31/2016] HYDROcodone-acetaminophen (NORCO/VICODIN) 5-325 MG tablet Take 1 tablet by mouth every 8 (eight) hours as needed for severe pain.  Derrill Memo ON 11/30/2016] HYDROcodone-acetaminophen (NORCO/VICODIN) 5-325 MG tablet Take 1 tablet by mouth every 8 (eight) hours as needed for severe pain.  Derrill Memo ON 12/30/2016] HYDROcodone-acetaminophen (NORCO/VICODIN) 5-325 MG tablet Take 1 tablet by mouth every 8 (eight) hours as needed for severe pain.  Marland Kitchen letrozole (FEMARA) 2.5 MG tablet Take 1 tablet (2.5 mg total) by mouth daily.  Marland Kitchen losartan (COZAAR) 100  MG tablet TAKE 1 TABLET BY MOUTH ONCE DAILY.  Marland Kitchen lubiprostone (AMITIZA) 24 MCG capsule TAKE 1 CAPSULE BY MOUTH ONCE DAILY WITH BREAKFAST.  Derrill Memo ON 10/31/2016] magnesium oxide (MAGNESIUM-OXIDE) 400 (241.3 Mg) MG tablet Take 1 tablet (400 mg total) by mouth daily.  Derrill Memo ON 10/31/2016] meloxicam (MOBIC) 7.5 MG tablet Take 1 tablet (7.5 mg total) by mouth daily.  . metoprolol succinate (TOPROL-XL) 25 MG 24 hr tablet TAKE 1 TABLET BY MOUTH TWICE DAILY  . Multiple Vitamin (MULTIVITAMIN) tablet Take 1 tablet by mouth daily.  Marland Kitchen omeprazole (PRILOSEC) 20 MG capsule TAKE 1 CAPSULE BY MOUTH TWICE DAILY FOR REFLUX.  . pravastatin (PRAVACHOL) 20 MG tablet Take 1 tablet (20 mg total) by mouth daily.   No facility-administered encounter medications on file as of 10/26/2016.     Allergies (verified) Vesicare [solifenacin]   History: Past Medical History:  Diagnosis Date  . Anemia   . Arm pain 07/26/2015  . Arthritis   . Arthritis, degenerative 03/26/2014  . Back pain 11/01/2013  . Breast cancer ([place])    Masectomy - left - 1986   . Breast cancer [facility])    Mastectomy-right -2014  . Chronic cystitis   . Degeneration of intervertebral disc of lumbosacral region 03/26/2014  . Enthesopathy of hip 03/26/2014  . GERD (gastroesophageal reflux disease)   . Hiatal hernia   . HTN (hypertension)   .  LBP (low back pain) 03/26/2014  . Neck pain 11/01/2013  . Parkinson disease ([place])   . Urinary incontinence    mixed    Past Surgical History:  Procedure Laterality Date  . ABDOMINAL HYSTERECTOMY    . BREAST BIOPSY  2013  . BREAST ENHANCEMENT SURGERY  1987  . BREAST IMPLANT REMOVAL    . BREAST IMPLANT REMOVAL Right 08/29/2012  . BREAST SURGERY  1986   s/p left mastectomy  . COLONOSCOPY WITH PROPOFOL N/A 09/13/2016   Procedure: COLONOSCOPY WITH PROPOFOL;  Surgeon: Manya Silvas, MD;  Location: [facility] ENDOSCOPY;  Service: Endoscopy;  Laterality: N/A;  . MASTECTOMY  08/2012   right  . TONSILLECTOMY AND  ADENOIDECTOMY  81  . VESICOVAGINAL FISTULA CLOSURE W/ TAH  1983   Family History  Problem Relation Age of Onset  . Diabetes Father   . Stroke Father   . Colon polyps Father   . Stroke Mother   . Parkinson's disease Mother    Social History   Occupational History  . Not on file.   Social History Main Topics  . Smoking status: Never Smoker  . Smokeless tobacco: Never Used     Comment: tobacco use - no  . Alcohol use No  . Drug use: No  . Sexual activity: No    Tobacco Counseling Counseling given: Not Answered   Activities of Daily Living In your present state of health, do you have any difficulty performing the following activities: 10/26/2016  Hearing? N  Vision? N  Difficulty concentrating or making decisions? N  Walking or climbing stairs? N  Dressing or bathing? N  Doing errands, shopping? N  Preparing Food and eating ? N  Using the Toilet? N  In the past six months, have you accidently leaked urine? N  Do you have problems with loss of bowel control? N  Managing your Medications? N  Managing your Finances? N  Housekeeping or managing your Housekeeping? Y  Some recent data might be hidden    Immunizations and Health Maintenance Immunization History  Administered Date(s) Administered  . Influenza Split 11/02/2014  . Influenza, High Dose Seasonal PF 10/02/2016  . Influenza,inj,Quad PF,36+ Mos 09/03/2015  . Influenza-Unspecified 09/27/2013   Health Maintenance Due  Topic Date Due  . TETANUS/TDAP  10/17/1956  . ZOSTAVAX  10/17/1997  . PNA vac Low Risk Adult (1 of 2 - PCV13) 10/17/2002    Patient Care Team: Einar Pheasant, MD as PCP - General (Internal Medicine)  Indicate any recent Medical Services you may have received from other than [facility] providers in the past year (date may be approximate).     Assessment:   This is a routine wellness examination for Wanda Martinez.  The goal of the wellness visit is to assist the patient how to close the gaps in care and  create a preventative care plan for the patient.   Taking calcium VIT D as appropriate/Osteoporosis risk reviewed.  Medications reviewed; taking without issues or barriers.  Safety issues reviewed; smoke detectors in the home. No firearms in the home. Wears seatbelts when driving or riding with others. No violence in the home.  No identified risk were noted; The patient was oriented x 3; appropriate in dress and manner and no objective failures at ADL's or IADL's.   Body mass index; discussed the importance of a healthy diet, water intake and exercise. Educational material provided.  Health maintenance gaps; closed.  Patient Concerns: None at this time. Follow up with PCP as needed.  Hearing/Vision screen Hearing Screening Comments: Passes the whisper test Vision Screening Comments: Follow[facility] Bilateral cataracts extracted Annual visits Wears glasses Last OV within the last 10 months Vision screening deferred per patient request  Dietary issues and exercise activities discussed: Current Exercise Habits: Home exercise routine (Active in the home and gardening.), Intensity: Mild, Exercise limited by: neurologic condition(s)  Goals    . Increase physical activity          Walk as tolerated.  Stretch prior to getting out of bed.      Depression Screen PHQ 2/9 Scores 10/26/2016 10/02/2016 09/14/2016 08/17/2016 08/02/2016 06/12/2016 05/02/2016  PHQ - 2 Score 0 0 0 0 0 0 0  PHQ- 9 Score - - - - - - -    Fall Risk Fall Risk  10/26/2016 10/02/2016 09/14/2016 08/17/2016 08/02/2016  Falls in the past year? No No No No No  Number falls in past yr: - - - - -  Risk Factor Category  - - - - -  Risk for fall due to : - - - - -    Cognitive Function: MMSE - Mini Mental State Exam 10/26/2016  Orientation to time 5  Orientation to Place 5  Registration 1  Attention/ Calculation 5  Recall 3  Language- name 2 objects 2  Language- repeat 1  Language- follow 3  step command 3  Language- read & follow direction 1  Write a sentence 1  Copy design 1  Total score 28     6CIT Screen 10/26/2016  What Year? 0 points  What month? 0 points  What time? 0 points  Count back from 20 0 points  Months in reverse 0 points    Screening Tests Health Maintenance  Topic Date Due  . TETANUS/TDAP  10[birth year]17/1956  . ZOSTAVAX  10/17/1997  . PNA vac Low Risk Adult (1 of 2 - PCV13) 10/17/2002  . MAMMOGRAM  09/03/2035 (Originally 07/11/2013)  . INFLUENZA VACCINE  Completed  . DEXA SCAN  Completed      Plan:    End of life planning; Advance aging; Advanced directives discussed. She does not have HCPOA/Living Will.   Additional information provided to help her start the conversation with her family.  Time spent on this topic is 20 minutes.  Follow up with PCP as needed.  Medicare Attestation I have personally reviewed: The patient's medical and social history Their use of alcohol, tobacco or illicit drugs Their current medications and supplements The patient's functional ability including ADLs,fall risks, home safety risks, cognitive, and hearing and visual impairment Diet and physical activities Evidence for depression   The patient's weight, height, BMI, and visual acuity have been recorded in the chart.  I have made referrals and provided education to the patient based on review of the above and I have provided the patient with a written personalized care plan for preventive services.    During the course of the visit, Wanda Martinez was educated and counseled about the following appropriate screening and preventive services:   Vaccines to include Pneumoccal, Influenza, Hepatitis B, Td, Zostavax, HCV  Electrocardiogram  Cardiovascular disease screening  Colorectal cancer screening  Bone density screening  Diabetes screening  Glaucoma screening  Mammography/PAP  Nutrition counseling  Smoking cessation counseling  Patient Instructions (the written  plan) were given to the patient.    Varney Biles, LPN   [id number]    Reviewed above information.  Agree with plan.   Dr Nicki Reaper

## 2016-10-26 NOTE — Patient Instructions (Addendum)
[name] , Thank you for taking time to come for your Medicare Wellness Visit. I appreciate your ongoing commitment to your health goals. Please review the following plan we discussed and let me know if I can assist you in the future.   These are the goals we discussed: Goals    . Increase physical activity          Walk as tolerated.  Stretch prior to getting out of bed.       This is a list of the screening recommended for you and due dates:  Health Maintenance  Topic Date Due  . Tetanus Vaccine  10[birth year]17/1954  . Shingles Vaccine  10/17/1997  . Pneumonia vaccines (1 of 2 - PCV13) 10/17/2002  . Mammogram  09/03/2035*  . Flu Shot  Completed  . DEXA scan (bone density measurement)  Completed  *Topic was postponed. The date shown is not the original due date.    Health Maintenance, Female Adopting a healthy lifestyle and getting preventive care can go a long way to promote health and wellness. Talk with your health care provider about what schedule of regular examinations is right for you. This is a good chance for you to check in with your provider about disease prevention and staying healthy. In between checkups, there are plenty of things you can do on your own. Experts have done a lot of research about which lifestyle changes and preventive measures are most likely to keep you healthy. Ask your health care provider for more information. WEIGHT AND DIET  Eat a healthy diet  Be sure to include plenty of vegetables, fruits, low-fat dairy products, and lean protein.  Do not eat a lot of foods high in solid fats, added sugars, or salt.  Get regular exercise. This is one of the most important things you can do for your health.  Most adults should exercise for at least 150 minutes each week. The exercise should increase your heart rate and make you sweat (moderate-intensity exercise).  Most adults should also do strengthening exercises at least twice a week. This is in addition to  the moderate-intensity exercise.  Maintain a healthy weight  Body mass index (BMI) is a measurement that can be used to identify possible weight problems. It estimates body fat based on height and weight. Your health care provider can help determine your BMI and help you achieve or maintain a healthy weight.  For females 40 years of age and older:   A BMI below 18.5 is considered underweight.  A BMI of 18.5 to 24.9 is normal.  A BMI of 25 to 29.9 is considered overweight.  A BMI of 30 and above is considered obese.  Watch levels of cholesterol and blood lipids  You should start having your blood tested for lipids and cholesterol at 79 years of age, then have this test every 5 years.  You may need to have your cholesterol levels checked more often if:  Your lipid or cholesterol levels are high.  You are older than 79 years of age.  You are at high risk for heart disease.  CANCER SCREENING   Lung Cancer  Lung cancer screening is recommended for adults 25-84 years old who are at high risk for lung cancer because of a history of smoking.  A yearly low-dose CT scan of the lungs is recommended for people who:  Currently smoke.  Have quit within the past 15 years.  Have at least a 30-pack-year history of smoking. A  pack year is smoking an average of one pack of cigarettes a day for 1 year.  Yearly screening should continue until it has been 15 years since you quit.  Yearly screening should stop if you develop a health problem that would prevent you from having lung cancer treatment.  Breast Cancer  Practice breast self-awareness. This means understanding how your breasts normally appear and feel.  It also means doing regular breast self-exams. Let your health care provider know about any changes, no matter how small.  If you are in your 20s or 30s, you should have a clinical breast exam (CBE) by a health care provider every 1-3 years as part of a regular health  exam.  If you are 3 or older, have a CBE every year. Also consider having a breast X-ray (mammogram) every year.  If you have a family history of breast cancer, talk to your health care provider about genetic screening.  If you are at high risk for breast cancer, talk to your health care provider about having an MRI and a mammogram every year.  Breast cancer gene (BRCA) assessment is recommended for women who have family members with BRCA-related cancers. BRCA-related cancers include:  Breast.  Ovarian.  Tubal.  Peritoneal cancers.  Results of the assessment will determine the need for genetic counseling and BRCA1 and BRCA2 testing. Cervical Cancer Your health care provider may recommend that you be screened regularly for cancer of the pelvic organs (ovaries, uterus, and vagina). This screening involves a pelvic examination, including checking for microscopic changes to the surface of your cervix (Pap test). You may be encouraged to have this screening done every 3 years, beginning at age 41.  For women ages 63-65, health care providers may recommend pelvic exams and Pap testing every 3 years, or they may recommend the Pap and pelvic exam, combined with testing for human papilloma virus (HPV), every 5 years. Some types of HPV increase your risk of cervical cancer. Testing for HPV may also be done on women of any age with unclear Pap test results.  Other health care providers may not recommend any screening for nonpregnant women who are considered low risk for pelvic cancer and who do not have symptoms. Ask your health care provider if a screening pelvic exam is right for you.  If you have had past treatment for cervical cancer or a condition that could lead to cancer, you need Pap tests and screening for cancer for at least 20 years after your treatment. If Pap tests have been discontinued, your risk factors (such as having a new sexual partner) need to be reassessed to determine if  screening should resume. Some women have medical problems that increase the chance of getting cervical cancer. In these cases, your health care provider may recommend more frequent screening and Pap tests. Colorectal Cancer  This type of cancer can be detected and often prevented.  Routine colorectal cancer screening usually begins at 79 years of age and continues through 79 years of age.  Your health care provider may recommend screening at an earlier age if you have risk factors for colon cancer.  Your health care provider may also recommend using home test kits to check for hidden blood in the stool.  A small camera at the end of a tube can be used to examine your colon directly (sigmoidoscopy or colonoscopy). This is done to check for the earliest forms of colorectal cancer.  Routine screening usually begins at age 42.  Direct  examination of the colon should be repeated every 5-10 years through 79 years of age. However, you may need to be screened more often if early forms of precancerous polyps or small growths are found. Skin Cancer  Check your skin from head to toe regularly.  Tell your health care provider about any new moles or changes in moles, especially if there is a change in a mole's shape or color.  Also tell your health care provider if you have a mole that is larger than the size of a pencil eraser.  Always use sunscreen. Apply sunscreen liberally and repeatedly throughout the day.  Protect yourself by wearing long sleeves, pants, a wide-brimmed hat, and sunglasses whenever you are outside. HEART DISEASE, DIABETES, AND HIGH BLOOD PRESSURE   High blood pressure causes heart disease and increases the risk of stroke. High blood pressure is more likely to develop in:  People who have blood pressure in the high end of the normal range (130-139/85-89 mm Hg).  People who are overweight or obese.  People who are African American.  If you are 62-82 years of age, have your  blood pressure checked every 3-5 years. If you are 17 years of age or older, have your blood pressure checked every year. You should have your blood pressure measured twice--once when you are at a hospital or clinic, and once when you are not at a hospital or clinic. Record the average of the two measurements. To check your blood pressure when you are not at a hospital or clinic, you can use:  An automated blood pressure machine at a pharmacy.  A home blood pressure monitor.  If you are between 77 years and 47 years old, ask your health care provider if you should take aspirin to prevent strokes.  Have regular diabetes screenings. This involves taking a blood sample to check your fasting blood sugar level.  If you are at a normal weight and have a low risk for diabetes, have this test once every three years after 79 years of age.  If you are overweight and have a high risk for diabetes, consider being tested at a younger age or more often. PREVENTING INFECTION  Hepatitis B  If you have a higher risk for hepatitis B, you should be screened for this virus. You are considered at high risk for hepatitis B if:  You were born in a country where hepatitis B is common. Ask your health care provider which countries are considered high risk.  Your parents were born in a high-risk country, and you have not been immunized against hepatitis B (hepatitis B vaccine).  You have HIV or AIDS.  You use needles to inject street drugs.  You live with someone who has hepatitis B.  You have had sex with someone who has hepatitis B.  You get hemodialysis treatment.  You take certain medicines for conditions, including cancer, organ transplantation, and autoimmune conditions. Hepatitis C  Blood testing is recommended for:  Everyone born from [birth year] through 1963.  Anyone with known risk factors for hepatitis C. Sexually transmitted infections (STIs)  You should be screened for sexually transmitted  infections (STIs) including gonorrhea and chlamydia if:  You are sexually active and are younger than 79 years of age.  You are older than 79 years of age and your health care provider tells you that you are at risk for this type of infection.  Your sexual activity has changed since you were last screened and you are at  an increased risk for chlamydia or gonorrhea. Ask your health care provider if you are at risk.  If you do not have HIV, but are at risk, it may be recommended that you take a prescription medicine daily to prevent HIV infection. This is called pre-exposure prophylaxis (PrEP). You are considered at risk if:  You are sexually active and do not regularly use condoms or know the HIV status of your partner(s).  You take drugs by injection.  You are sexually active with a partner who has HIV. Talk with your health care provider about whether you are at high risk of being infected with HIV. If you choose to begin PrEP, you should first be tested for HIV. You should then be tested every 3 months for as long as you are taking PrEP.  PREGNANCY   If you are premenopausal and you may become pregnant, ask your health care provider about preconception counseling.  If you may become pregnant, take 400 to 800 micrograms (mcg) of folic acid every day.  If you want to prevent pregnancy, talk to your health care provider about birth control (contraception). OSTEOPOROSIS AND MENOPAUSE   Osteoporosis is a disease in which the bones lose minerals and strength with aging. This can result in serious bone fractures. Your risk for osteoporosis can be identified using a bone density scan.  If you are 50 years of age or older, or if you are at risk for osteoporosis and fractures, ask your health care provider if you should be screened.  Ask your health care provider whether you should take a calcium or vitamin D supplement to lower your risk for osteoporosis.  Menopause may have certain physical  symptoms and risks.  Hormone replacement therapy may reduce some of these symptoms and risks. Talk to your health care provider about whether hormone replacement therapy is right for you.  HOME CARE INSTRUCTIONS   Schedule regular health, dental, and eye exams.  Stay current with your immunizations.   Do not use any tobacco products including cigarettes, chewing tobacco, or electronic cigarettes.  If you are pregnant, do not drink alcohol.  If you are breastfeeding, limit how much and how often you drink alcohol.  Limit alcohol intake to no more than 1 drink per day for nonpregnant women. One drink equals 12 ounces of beer, 5 ounces of wine, or 1 ounces of hard liquor.  Do not use street drugs.  Do not share needles.  Ask your health care provider for help if you need support or information about quitting drugs.  Tell your health care provider if you often feel depressed.  Tell your health care provider if you have ever been abused or do not feel safe at home.   This information is not intended to replace advice given to you by your health care provider. Make sure you discuss any questions you have with your health care provider.   Document Released: 06/19/2011 Document Revised: 12/25/2014 Document Reviewed: 11/05/2013 Elsevier Interactive Patient Education [employer].

## 2016-10-30 ENCOUNTER — Ambulatory Visit: Payer: Medicare Other

## 2016-11-22 ENCOUNTER — Other Ambulatory Visit: Payer: Self-pay | Admitting: Pain Medicine

## 2016-11-22 DIAGNOSIS — M7918 Myalgia, other site: Secondary | ICD-10-CM

## 2016-11-22 DIAGNOSIS — M150 Primary generalized (osteo)arthritis: Principal | ICD-10-CM

## 2016-11-22 DIAGNOSIS — M159 Polyosteoarthritis, unspecified: Secondary | ICD-10-CM

## 2016-11-22 DIAGNOSIS — M792 Neuralgia and neuritis, unspecified: Secondary | ICD-10-CM

## 2016-11-22 DIAGNOSIS — G8929 Other chronic pain: Secondary | ICD-10-CM

## 2016-12-19 ENCOUNTER — Other Ambulatory Visit: Payer: Self-pay | Admitting: Pain Medicine

## 2016-12-19 DIAGNOSIS — M792 Neuralgia and neuritis, unspecified: Secondary | ICD-10-CM

## 2016-12-19 DIAGNOSIS — M7918 Myalgia, other site: Secondary | ICD-10-CM

## 2016-12-19 DIAGNOSIS — M159 Polyosteoarthritis, unspecified: Secondary | ICD-10-CM

## 2016-12-19 DIAGNOSIS — G8929 Other chronic pain: Secondary | ICD-10-CM

## 2016-12-19 DIAGNOSIS — M150 Primary generalized (osteo)arthritis: Principal | ICD-10-CM

## 2016-12-22 ENCOUNTER — Other Ambulatory Visit: Payer: Self-pay | Admitting: Oncology

## 2017-01-16 ENCOUNTER — Encounter: Payer: Self-pay | Admitting: Pain Medicine

## 2017-01-16 ENCOUNTER — Ambulatory Visit: Payer: Medicare Other | Attending: Pain Medicine | Admitting: Pain Medicine

## 2017-01-16 VITALS — BP 130/78 | HR 68 | Temp 97.8°F | Resp 16 | Ht 64.0 in | Wt 180.0 lb

## 2017-01-16 DIAGNOSIS — Z79891 Long term (current) use of opiate analgesic: Secondary | ICD-10-CM | POA: Diagnosis not present

## 2017-01-16 DIAGNOSIS — M150 Primary generalized (osteo)arthritis: Secondary | ICD-10-CM

## 2017-01-16 DIAGNOSIS — M791 Myalgia: Secondary | ICD-10-CM | POA: Diagnosis not present

## 2017-01-16 DIAGNOSIS — Z5181 Encounter for therapeutic drug level monitoring: Secondary | ICD-10-CM | POA: Diagnosis not present

## 2017-01-16 DIAGNOSIS — M545 Low back pain: Secondary | ICD-10-CM | POA: Diagnosis not present

## 2017-01-16 DIAGNOSIS — G894 Chronic pain syndrome: Secondary | ICD-10-CM | POA: Diagnosis not present

## 2017-01-16 DIAGNOSIS — M1288 Other specific arthropathies, not elsewhere classified, other specified site: Secondary | ICD-10-CM | POA: Diagnosis not present

## 2017-01-16 DIAGNOSIS — F1190 Opioid use, unspecified, uncomplicated: Secondary | ICD-10-CM

## 2017-01-16 DIAGNOSIS — M1991 Primary osteoarthritis, unspecified site: Secondary | ICD-10-CM | POA: Insufficient documentation

## 2017-01-16 DIAGNOSIS — M159 Polyosteoarthritis, unspecified: Secondary | ICD-10-CM

## 2017-01-16 DIAGNOSIS — M7918 Myalgia, other site: Secondary | ICD-10-CM

## 2017-01-16 DIAGNOSIS — G8929 Other chronic pain: Secondary | ICD-10-CM

## 2017-01-16 DIAGNOSIS — M792 Neuralgia and neuritis, unspecified: Secondary | ICD-10-CM

## 2017-01-16 DIAGNOSIS — M47816 Spondylosis without myelopathy or radiculopathy, lumbar region: Secondary | ICD-10-CM

## 2017-01-16 DIAGNOSIS — M47812 Spondylosis without myelopathy or radiculopathy, cervical region: Secondary | ICD-10-CM

## 2017-01-16 MED ORDER — HYDROCODONE-ACETAMINOPHEN 5-325 MG PO TABS: 1.0000 | ORAL_TABLET | Freq: Three times a day (TID) | ORAL | 0 refills | Status: DC | PRN

## 2017-01-16 MED ORDER — GABAPENTIN 300 MG PO CAPS: 300.0000 mg | ORAL_CAPSULE | Freq: Two times a day (BID) | ORAL | 0 refills | Status: DC

## 2017-01-16 MED ORDER — MAGNESIUM OXIDE 400 (241.3 MG) MG PO TABS: 1.0000 | ORAL_TABLET | Freq: Every day | ORAL | 0 refills | Status: DC

## 2017-01-16 MED ORDER — CYCLOBENZAPRINE HCL 10 MG PO TABS: 10.0000 mg | ORAL_TABLET | Freq: Every day | ORAL | 0 refills | Status: DC

## 2017-01-16 MED ORDER — MELOXICAM 7.5 MG PO TABS: 7.5000 mg | ORAL_TABLET | Freq: Every day | ORAL | 0 refills | Status: DC

## 2017-01-16 NOTE — Patient Instructions (Signed)
GENERAL RISKS AND COMPLICATIONS  What are the risk, side effects and possible complications? Generally speaking, most procedures are safe.  However, with any procedure there are risks, side effects, and the possibility of complications.  The risks and complications are dependent upon the sites that are lesioned, or the type of nerve block to be performed.  The closer the procedure is to the spine, the more serious the risks are.  Great care is taken when placing the radio frequency needles, block needles or lesioning probes, but sometimes complications can occur. 1. Infection: Any time there is an injection through the skin, there is a risk of infection.  This is why sterile conditions are used for these blocks.  There are four possible types of infection. 1. Localized skin infection. 2. Central Nervous System Infection-This can be in the form of Meningitis, which can be deadly. 3. Epidural Infections-This can be in the form of an epidural abscess, which can cause pressure inside of the spine, causing compression of the spinal cord with subsequent paralysis. This would require an emergency surgery to decompress, and there are no guarantees that the patient would recover from the paralysis. 4. Discitis-This is an infection of the intervertebral discs.  It occurs in about 1% of discography procedures.  It is difficult to treat and it may lead to surgery.        2. Pain: the needles have to go through skin and soft tissues, will cause soreness.       3. Damage to internal structures:  The nerves to be lesioned may be near blood vessels or    other nerves which can be potentially damaged.       4. Bleeding: Bleeding is more common if the patient is taking blood thinners such as  aspirin, Coumadin, Ticiid, Plavix, etc., or if he/she have some genetic predisposition  such as hemophilia. Bleeding into the spinal canal can cause compression of the spinal  cord with subsequent paralysis.  This would require an  emergency surgery to  decompress and there are no guarantees that the patient would recover from the  paralysis.       5. Pneumothorax:  Puncturing of a lung is a possibility, every time a needle is introduced in  the area of the chest or upper back.  Pneumothorax refers to free air around the  collapsed lung(s), inside of the thoracic cavity (chest cavity).  Another two possible  complications related to a similar event would include: Hemothorax and Chylothorax.   These are variations of the Pneumothorax, where instead of air around the collapsed  lung(s), you may have blood or chyle, respectively.       6. Spinal headaches: They may occur with any procedures in the area of the spine.       7. Persistent CSF (Cerebro-Spinal Fluid) leakage: This is a rare problem, but may occur  with prolonged intrathecal or epidural catheters either due to the formation of a fistulous  track or a dural tear.       8. Nerve damage: By working so close to the spinal cord, there is always a possibility of  nerve damage, which could be as serious as a permanent spinal cord injury with  paralysis.       9. Death:  Although rare, severe deadly allergic reactions known as "Anaphylactic  reaction" can occur to any of the medications used.      10. Worsening of the symptoms:  We can always make thing worse.    What are the chances of something like this happening? Chances of any of this occuring are extremely low.  By statistics, you have more of a chance of getting killed in a motor vehicle accident: while driving to the hospital than any of the above occurring .  Nevertheless, you should be aware that they are possibilities.  In general, it is similar to taking a shower.  Everybody knows that you can slip, hit your head and get killed.  Does that mean that you should not shower again?  Nevertheless always keep in mind that statistics do not mean anything if you happen to be on the wrong side of them.  Even if a procedure has a 1  (one) in a 1,000,000 (million) chance of going wrong, it you happen to be that one..Also, keep in mind that by statistics, you have more of a chance of having something go wrong when taking medications.  Who should not have this procedure? If you are on a blood thinning medication (e.g. Coumadin, Plavix, see list of "Blood Thinners"), or if you have an active infection going on, you should not have the procedure.  If you are taking any blood thinners, please inform your physician.  How should I prepare for this procedure?  Do not eat or drink anything at least six hours prior to the procedure.  Bring a driver with you .  It cannot be a taxi.  Come accompanied by an adult that can drive you back, and that is strong enough to help you if your legs get weak or numb from the local anesthetic.  Take all of your medicines the morning of the procedure with just enough water to swallow them.  If you have diabetes, make sure that you are scheduled to have your procedure done first thing in the morning, whenever possible.  If you have diabetes, take only half of your insulin dose and notify our nurse that you have done so as soon as you arrive at the clinic.  If you are diabetic, but only take blood sugar pills (oral hypoglycemic), then do not take them on the morning of your procedure.  You may take them after you have had the procedure.  Do not take aspirin or any aspirin-containing medications, at least eleven (11) days prior to the procedure.  They may prolong bleeding.  Wear loose fitting clothing that may be easy to take off and that you would not mind if it got stained with Betadine or blood.  Do not wear any jewelry or perfume  Remove any nail coloring.  It will interfere with some of our monitoring equipment.  NOTE: Remember that this is not meant to be interpreted as a complete list of all possible complications.  Unforeseen problems may occur.  BLOOD THINNERS The following drugs  contain aspirin or other products, which can cause increased bleeding during surgery and should not be taken for 2 weeks prior to and 1 week after surgery.  If you should need take something for relief of minor pain, you may take acetaminophen which is found in Tylenol,m Datril, Anacin-3 and Panadol. It is not blood thinner. The products listed below are.  Do not take any of the products listed below in addition to any listed on your instruction sheet.  A.P.C or A.P.C with Codeine Codeine Phosphate Capsules #3 Ibuprofen Ridaura  ABC compound Congesprin Imuran rimadil  Advil Cope Indocin Robaxisal  Alka-Seltzer Effervescent Pain Reliever and Antacid Coricidin or Coricidin-D  Indomethacin Rufen    Alka-Seltzer plus Cold Medicine Cosprin Ketoprofen S-A-C Tablets  Anacin Analgesic Tablets or Capsules Coumadin Korlgesic Salflex  Anacin Extra Strength Analgesic tablets or capsules CP-2 Tablets Lanoril Salicylate  Anaprox Cuprimine Capsules Levenox Salocol  Anexsia-D Dalteparin Magan Salsalate  Anodynos Darvon compound Magnesium Salicylate Sine-off  Ansaid Dasin Capsules Magsal Sodium Salicylate  Anturane Depen Capsules Marnal Soma  APF Arthritis pain formula Dewitt's Pills Measurin Stanback  Argesic Dia-Gesic Meclofenamic Sulfinpyrazone  Arthritis Bayer Timed Release Aspirin Diclofenac Meclomen Sulindac  Arthritis pain formula Anacin Dicumarol Medipren Supac  Analgesic (Safety coated) Arthralgen Diffunasal Mefanamic Suprofen  Arthritis Strength Bufferin Dihydrocodeine Mepro Compound Suprol  Arthropan liquid Dopirydamole Methcarbomol with Aspirin Synalgos  ASA tablets/Enseals Disalcid Micrainin Tagament  Ascriptin Doan's Midol Talwin  Ascriptin A/D Dolene Mobidin Tanderil  Ascriptin Extra Strength Dolobid Moblgesic Ticlid  Ascriptin with Codeine Doloprin or Doloprin with Codeine Momentum Tolectin  Asperbuf Duoprin Mono-gesic Trendar  Aspergum Duradyne Motrin or Motrin IB Triminicin  Aspirin  plain, buffered or enteric coated Durasal Myochrisine Trigesic  Aspirin Suppositories Easprin Nalfon Trillsate  Aspirin with Codeine Ecotrin Regular or Extra Strength Naprosyn Uracel  Atromid-S Efficin Naproxen Ursinus  Auranofin Capsules Elmiron Neocylate Vanquish  Axotal Emagrin Norgesic Verin  Azathioprine Empirin or Empirin with Codeine Normiflo Vitamin E  Azolid Emprazil Nuprin Voltaren  Bayer Aspirin plain, buffered or children's or timed BC Tablets or powders Encaprin Orgaran Warfarin Sodium  Buff-a-Comp Enoxaparin Orudis Zorpin  Buff-a-Comp with Codeine Equegesic Os-Cal-Gesic   Buffaprin Excedrin plain, buffered or Extra Strength Oxalid   Bufferin Arthritis Strength Feldene Oxphenbutazone   Bufferin plain or Extra Strength Feldene Capsules Oxycodone with Aspirin   Bufferin with Codeine Fenoprofen Fenoprofen Pabalate or Pabalate-SF   Buffets II Flogesic Panagesic   Buffinol plain or Extra Strength Florinal or Florinal with Codeine Panwarfarin   Buf-Tabs Flurbiprofen Penicillamine   Butalbital Compound Four-way cold tablets Penicillin   Butazolidin Fragmin Pepto-Bismol   Carbenicillin Geminisyn Percodan   Carna Arthritis Reliever Geopen Persantine   Carprofen Gold's salt Persistin   Chloramphenicol Goody's Phenylbutazone   Chloromycetin Haltrain Piroxlcam   Clmetidine heparin Plaquenil   Cllnoril Hyco-pap Ponstel   Clofibrate Hydroxy chloroquine Propoxyphen         Before stopping any of these medications, be sure to consult the physician who ordered them.  Some, such as Coumadin (Warfarin) are ordered to prevent or treat serious conditions such as "deep thrombosis", "pumonary embolisms", and other heart problems.  The amount of time that you may need off of the medication may also vary with the medication and the reason for which you were taking it.  If you are taking any of these medications, please make sure you notify your pain physician before you undergo any  procedures.         Facet Joint Block The facet joints connect the bones of the spine (vertebrae). They make it possible for you to bend, twist, and make other movements with your spine. They also prevent you from overbending, overtwisting, and making other excessive movements.  A facet joint block is a procedure where a numbing medicine (anesthetic) is injected into a facet joint. Often, a type of anti-inflammatory medicine called a steroid is also injected. A facet joint block may be done for two reasons:   Diagnosis. A facet joint block may be done as a test to see whether neck or back pain is caused by a worn-down or infected facet joint. If the pain gets better after a facet joint block, it means the   pain is probably coming from the facet joint. If the pain does not get better, it means the pain is probably not coming from the facet joint.   Therapy. A facet joint block may be done to relieve neck or back pain caused by a facet joint. A facet joint block is only done as a therapy if the pain does not improve with medicine, exercise programs, physical therapy, and other forms of pain management. LET YOUR HEALTH CARE PROVIDER KNOW ABOUT:   Any allergies you have.   All medicines you are taking, including vitamins, herbs, eyedrops, and over-the-counter medicines and creams.   Previous problems you or members of your family have had with the use of anesthetics.   Any blood disorders you have had.   Other health problems you have. RISKS AND COMPLICATIONS Generally, having a facet joint block is safe. However, as with any procedure, complications can occur. Possible complications associated with having a facet joint block include:   Bleeding.   Injury to a nerve near the injection site.   Pain at the injection site.   Weakness or numbness in areas controlled by nerves near the injection site.   Infection.   Temporary fluid retention.   Allergic reaction to  anesthetics or medicines used during the procedure. BEFORE THE PROCEDURE   Follow your health care provider's instructions if you are taking dietary supplements or medicines. You may need to stop taking them or reduce your dosage.   Do not take any new dietary supplements or medicines without asking your health care provider first.   Follow your health care provider's instructions about eating and drinking before the procedure. You may need to stop eating and drinking several hours before the procedure.   Arrange to have an adult drive you home after the procedure. PROCEDURE  You may need to remove your clothing and dress in an open-back gown so that your health care provider can access your spine.   The procedure will be done while you are lying on an X-ray table. Most of the time you will be asked to lie on your stomach, but you may be asked to lie in a different position if an injection will be made in your neck.   Special machines will be used to monitor your oxygen levels, heart rate, and blood pressure.   If an injection will be made in your neck, an intravenous (IV) tube will be inserted into one of your veins. Fluids and medicine will flow directly into your body through the IV tube.   The area over the facet joint where the injection will be made will be cleaned with an antiseptic soap. The surrounding skin will be covered with sterile drapes.   An anesthetic will be applied to your skin to make the injection area numb. You may feel a temporary stinging or burning sensation.   A video X-ray machine will be used to locate the joint. A contrast dye may be injected into the facet joint area to help with locating the joint.   When the joint is located, an anesthetic medicine will be injected into the joint through the needle.   Your health care provider will ask you whether you feel pain relief. If you do feel relief, a steroid may be injected to provide pain relief for a  longer period of time. If you do not feel relief or feel only partial relief, additional injections of an anesthetic may be made in other facet joints.     The needle will be removed, the skin will be cleansed, and bandages will be applied.  AFTER THE PROCEDURE   You will be observed for 15-30 minutes before being allowed to go home. Do not drive. Have an adult drive you or take a taxi or public transportation instead.   If you feel pain relief, the pain will return in several hours or days when the anesthetic wears off.   You may feel pain relief 2-14 days after the procedure. The amount of time this relief lasts varies from person to person.   It is normal to feel some tenderness over the injected area(s) for 2 days following the procedure.   If you have diabetes, you may have a temporary increase in blood sugar. This information is not intended to replace advice given to you by your health care provider. Make sure you discuss any questions you have with your health care provider. Document Released: 04/25/2007 Document Revised: 12/25/2014 Document Reviewed: 08/30/2015 [employer] Interactive Patient Education  2017 [employer]  

## 2017-01-16 NOTE — Progress Notes (Signed)
Nursing Pain Medication Assessment:  Safety precautions to be maintained throughout the outpatient stay will include: orient to surroundings, keep bed in low position, maintain call bell within reach at all times, provide assistance with transfer out of bed and ambulation.  Medication Inspection Compliance: Pill count conducted under aseptic conditions, in front of the patient. Neither the pills nor the bottle was removed from the patient's sight at any time. Once count was completed pills were immediately returned to the patient in their original bottle. Pill Count: 43 of 90 pills remain Bottle Appearance: Standard pharmacy container. Clearly labeled. Medication: See above Filled Date: 01 / 15 / 2018

## 2017-01-16 NOTE — Progress Notes (Signed)
Patient's Name: Wanda Martinez  MRN: [id number]  Referring Provider: Einar Pheasant, MD  DOB: May 18, 1935  PCP: Einar Pheasant, MD  DOS: 01/16/2017  Note by: Kathlen Brunswick. Dossie Arbour, MD  Service setting: Ambulatory outpatient  Specialty: Interventional Pain Management  Location: [facility] (AMB) Pain Management Facility    Patient type: Established   Primary Reason(s) for Visit: Encounter for prescription drug management (Level of risk: moderate) CC: No chief complaint on file.  HPI  Wanda Martinez is a 82 y.o. year old, female patient, who comes today for a medication management evaluation. She has Hypercholesterolemia; Essential hypertension; DYSPNEA; CHEST PAIN; Frequent UTI; Cystocele; Primary cancer of upper inner quadrant of right female breast ([place]); Anemia; Tremor; Toenail fungus; Skin lesions; Headache; Obesity (BMI 30-39.9); Dysuria; Sinusitis; Health care maintenance; Parkinsons ([place]); Fatigue; Chronic kidney disease (CKD), stage II (mild); Cystocele, midline; Clinical depression; Incomplete bladder emptying; Mixed incontinence; HLD (hyperlipidemia); Bladder infection, chronic; Parkinson's disease ([place]); Pure hypercholesterolemia; Hernia, rectovaginal; Long term current use of opiate analgesic; Long term prescription opiate use; Opiate use; Encounter for therapeutic drug level monitoring; Encounter for chronic pain management; Chronic low back pain (Location of Primary Source of Pain) (Bilateral) (R>L); Lumbar spondylosis; Chronic hip pain; Chronic neck pain; Cervical spondylosis; Chronic cervical radicular pain (Right); Chronic upper extremity pain (Right); Osteoarthritis; Diffuse myofascial pain syndrome; Neurogenic pain; Chronic upper back pain (Right); Myofascial pain syndrome (Right) (cervicothoracic); Vaginitis; Lumbar facet syndrome (Location of Primary Source of Pain) (Bilateral) (R>L); Malignant neoplasm of right female breast ([place]); Chronic constipation; Cervical facet hypertrophy; Cervical  facet syndrome; Chronic shoulder pain; and Chronic pain syndrome on her problem list. Her primarily concern today is the No chief complaint on file.  Pain Assessment: Self-Reported Pain Score: 6 /10 Clinically the patient looks like a 1/10 Reported level is inconsistent with clinical observations. Information on the proper use of the pain scale provided to the patient today Pain Type: Chronic pain Pain Location: Back Pain Orientation: Lower Pain Descriptors / Indicators: Aching, Constant, Nagging, Spasm Pain Frequency: Constant  Wanda Martinez was last seen on 12/19/2016 for medication management. During today's appointment we reviewed Ms. Burkemper's chronic pain status, as well as her outpatient medication regimen.  The patient  reports that she does not use drugs. Her body mass index is 30.9 kg/m.  Further details on both, my assessment(s), as well as the proposed treatment plan, please see below.  Controlled Substance Pharmacotherapy Assessment REMS (Risk Evaluation and Mitigation Strategy)  Analgesic:Hydrocodone/APAP 5/325 one tablet every 8 hours (15 mg/day) MME/day:15 mg/day.  Ignatius Specking, RN  01/16/2017 11:54 AM  Sign at close encounter Nursing Pain Medication Assessment:  Safety precautions to be maintained throughout the outpatient stay will include: orient to surroundings, keep bed in low position, maintain call bell within reach at all times, provide assistance with transfer out of bed and ambulation.  Medication Inspection Compliance: Pill count conducted under aseptic conditions, in front of the patient. Neither the pills nor the bottle was removed from the patient's sight at any time. Once count was completed pills were immediately returned to the patient in their original bottle. Pill Count: 43 of 90 pills remain Bottle Appearance: Standard pharmacy container. Clearly labeled. Medication: See above Filled Date: 01 / 15 / 2018   Pharmacokinetics: Liberation and absorption  (onset of action): WNL Distribution (time to peak effect): WNL Metabolism and excretion (duration of action): WNL         Pharmacodynamics: Desired effects: Analgesia: Wanda Martinez reports >50% benefit. Functional ability: Patient reports  that medication allows her to accomplish basic ADLs Clinically meaningful improvement in function (CMIF): Sustained CMIF goals met Perceived effectiveness: Described as relatively effective, allowing for increase in activities of daily living (ADL) Undesirable effects: Side-effects or Adverse reactions: None reported Monitoring: [place] PMP: Online review of the past 76-monthperiod conducted. Compliant with practice rules and regulations List of all UDS test(s) done:  Lab Results  Component Value Date   TOXASSSELUR FINAL 03/27/2016   [place] 12/29/2015   Last UDS on record: ToxAssure Select 13  Date Value Ref Range Status  03/27/2016 FINAL  Final    Comment:    ==================================================================== TOXASSURE SELECT 13 (MW) ==================================================================== Test                             Result       Flag       Units Drug Present and Declared for Prescription Verification   Hydrocodone                    644          EXPECTED   ng/mg creat   Norhydrocodone                 707          EXPECTED   ng/mg creat    Sources of hydrocodone include scheduled prescription    medications. Norhydrocodone is an expected metabolite of    hydrocodone. ==================================================================== Test                      Result    Flag   Units      Ref Range   Creatinine              68               mg/dL      >=20 ==================================================================== Declared Medications:  The flagging and interpretation on this report are based on the  following declared medications.  Unexpected results may arise from  inaccuracies in the  declared medications.  **Note: The testing scope of this panel includes these medications:  Hydrocodone (Norco)  Hydrocodone (Vicodin)  **Note: The testing scope of this panel does not include following  reported medications:  Acetaminophen (Norco)  Acetaminophen (Vicodin)  Amlodipine (Norvasc)  Aspirin  Carbidopa (Sinemet)  Citalopram (Celexa)  Cyclobenzaprine (Flexeril)  Gabapentin (Neurontin)  Hydrochlorothiazide (Hydrodiuril)  Letrozole (Femara)  Levodopa (Sinemet)  Losartan (Cozaar)  Magnesium (Mag-Ox)  Meloxicam (Mobic)  Metoprolol (Toprol)  Multivitamin  Omeprazole (Prilosec)  Pravastatin (Pravachol)  Supplement (OsCal)  Trazodone (Desyrel) ==================================================================== For clinical consultation, please call [phone] ====================================================================    UDS interpretation: Compliant          Medication Assessment Form: Reviewed. Patient indicates being compliant with therapy Treatment compliance: Compliant Risk Assessment Profile: Aberrant behavior: See prior evaluations. None observed or detected today Comorbid factors increasing risk of overdose: See prior notes. No additional risks detected today Risk of substance use disorder (SUD): Low Opioid Risk Tool (ORT) Total Score: 1  Interpretation Table:  Score <3 = Low Risk for SUD  Score between 4-7 = Moderate Risk for SUD  Score >8 = High Risk for Opioid Abuse   Risk Mitigation Strategies:  Patient Counseling: Covered Patient-Prescriber Agreement (PPA): Present and active  Notification to other healthcare providers: Done  Pharmacologic Plan: No change in therapy, at this time  Laboratory Chemistry  Inflammation Markers No results found for: ESRSEDRATE, CRP Renal Function Lab Results  Component Value Date   BUN 17 10/18/2016   CREATININE 0.98 10/18/2016   GFRAA >60 10/08/2014   GFRNONAA >60 10/08/2014   Hepatic  Function Lab Results  Component Value Date   AST 22 10/18/2016   ALT 12 10/18/2016   ALBUMIN 4.3 10/18/2016   Electrolytes Lab Results  Component Value Date   NA 137 10/18/2016   K 4.6 10/18/2016   CL 98 10/18/2016   CALCIUM 9.7 10/18/2016   MG 1.8 04/21/2013   Pain Modulating Vitamins Lab Results  Component Value Date   YQMVHQIO96 295 02/01/2016   Coagulation Parameters Lab Results  Component Value Date   PLT 244.0 10/18/2016   Cardiovascular Lab Results  Component Value Date   HGB 12.3 10/18/2016   HCT 36.6 10/18/2016   Note: Lab results reviewed.  Recent Diagnostic Imaging Review  No results found. Note: Imaging results reviewed.          Meds  The patient has a current medication list which includes the following prescription(s): amlodipine, aspirin, calcium carbonate, carbidopa-levodopa, citalopram, cyclobenzaprine, gabapentin, hydrochlorothiazide, hydrocodone-acetaminophen, hydrocodone-acetaminophen, hydrocodone-acetaminophen, letrozole, losartan, lubiprostone, magnesium oxide, meloxicam, metoprolol succinate, multivitamin, omeprazole, and pravastatin.  Current Outpatient Prescriptions on File Prior to Visit  Medication Sig  . amLODipine (NORVASC) 2.5 MG tablet TAKE 1 TABLET BY MOUTH ONCE DAILY.  Marland Kitchen aspirin 81 MG EC tablet Take 81 mg by mouth daily as needed.    . Calcium Carbonate (CALCIUM 600) 1500 MG TABS Take 1 tablet by mouth daily.    . carbidopa-levodopa (SINEMET IR) 25-100 MG per tablet Take 1 tablet by mouth 3 (three) times daily.  . citalopram (CELEXA) 20 MG tablet Take 20 mg by mouth daily.  . hydrochlorothiazide (HYDRODIURIL) 25 MG tablet TAKE 1 TABLET BY MOUTH ONCE DAILY.  Marland Kitchen letrozole (FEMARA) 2.5 MG tablet TAKE 1 TABLET BY MOUTH ONCE DAILY.  Marland Kitchen losartan (COZAAR) 100 MG tablet TAKE 1 TABLET BY MOUTH ONCE DAILY.  Marland Kitchen lubiprostone (AMITIZA) 24 MCG capsule TAKE 1 CAPSULE BY MOUTH ONCE DAILY WITH BREAKFAST.  . metoprolol succinate (TOPROL-XL) 25 MG 24 hr  tablet TAKE 1 TABLET BY MOUTH TWICE DAILY  . Multiple Vitamin (MULTIVITAMIN) tablet Take 1 tablet by mouth daily.  Marland Kitchen omeprazole (PRILOSEC) 20 MG capsule TAKE 1 CAPSULE BY MOUTH TWICE DAILY FOR REFLUX.  . pravastatin (PRAVACHOL) 20 MG tablet Take 1 tablet (20 mg total) by mouth daily.   No current facility-administered medications on file prior to visit.    ROS  Constitutional: Denies any fever or chills Gastrointestinal: No reported hemesis, hematochezia, vomiting, or acute GI distress Musculoskeletal: Denies any acute onset joint swelling, redness, loss of ROM, or weakness Neurological: No reported episodes of acute onset apraxia, aphasia, dysarthria, agnosia, amnesia, paralysis, loss of coordination, or loss of consciousness  Allergies  Ms. Pendley is allergic to vesicare [solifenacin].  [place]  Drug: Ms. Bonser  reports that she does not use drugs. Alcohol:  reports that she does not drink alcohol. Tobacco:  reports that she has never smoked. She has never used smokeless tobacco. Medical:  has a past medical history of Anemia; Arm pain (07/26/2015); Arthritis; Arthritis, degenerative (03/26/2014); Back pain (11/01/2013); Breast cancer ([place]); Breast cancer ([place]); Chronic cystitis; Degeneration of intervertebral disc of lumbosacral region (03/26/2014); Enthesopathy of hip (03/26/2014); GERD (gastroesophageal reflux disease); Hiatal hernia; HTN (hypertension); LBP (low back pain) (03/26/2014); Neck pain (11/01/2013); Parkinson disease ([place]); and Urinary incontinence. Family: family history includes  Colon polyps in her father; Diabetes in her father; Parkinson's disease in her mother; Stroke in her father and mother.  Past Surgical History:  Procedure Laterality Date  . ABDOMINAL HYSTERECTOMY    . BREAST BIOPSY  2013  . BREAST ENHANCEMENT SURGERY  1987  . BREAST IMPLANT REMOVAL    . BREAST IMPLANT REMOVAL Right 08/29/2012  . BREAST SURGERY  1986   s/p left mastectomy  . COLONOSCOPY WITH PROPOFOL N/A  09/13/2016   Procedure: COLONOSCOPY WITH PROPOFOL;  Surgeon: Manya Silvas, MD;  [facility] END[facility]COPY;  Service: Endoscopy;  Laterality: N/A;  . MASTECTOMY  08/2012   right  . TONSILLECTOMY AND ADENOIDECTOMY  79  . VESICOVAGINAL FISTULA CLOSURE W/ TAH  1983   Constitutional Exam  General appearance: Well nourished, well developed, and well hydrated. In no apparent acute distress Vitals:   01/16/17 1135  BP: 130/78  Pulse: 68  Resp: 16  Temp: 97.8 F (36.6 C)  TempSrc: Oral  SpO2: 100%  Weight: 180 lb (81.6 kg)  Height: _0  (1.626 m)   BMI Assessment: Estimated body mass index is 30.9 kg/m as calculated from the following:   Height as of this encounter: _1  (1.626 m).   Weight as of this encounter: 180 lb (81.6 kg).  BMI interpretation table: BMI level Category Range association with higher incidence of chronic pain  <18 kg/m2 Underweight   18.5-24.9 kg/m2 Ideal body weight   25-29.9 kg/m2 Overweight Increased incidence by 20%  30-34.9 kg/m2 Obese (Class I) Increased incidence by 68%  35-39.9 kg/m2 Severe obesity (Class II) Increased incidence by 136%  >40 kg/m2 Extreme obesity (Class III) Increased incidence by 254%   BMI Readings from Last 4 Encounters:  01/16/17 30.90 kg/m  10/26/16 32.79 kg/m  10/02/16 32.03 kg/m  09/26/16 31.48 kg/m   Wt Readings from Last 4 Encounters:  01/16/17 180 lb (81.6 kg)  10/26/16 185 lb 1.9 oz (84 kg)  10/02/16 186 lb 9.6 oz (84.6 kg)  09/26/16 183 lb 6.8 oz (83.2 kg)  Psych/Mental status: Alert, oriented x 3 (person, place, & time)       Eyes: PERLA Respiratory: No evidence of acute respiratory distress  Cervical Spine Exam  Inspection: No masses, redness, or swelling Alignment: Symmetrical Functional ROM: Unrestricted ROM Stability: No instability detected Muscle strength & Tone: Functionally intact Sensory: Unimpaired Palpation: Non-contributory  Upper Extremity (UE) Exam    Side: Right upper extremity  Side:  Left upper extremity  Inspection: No masses, redness, swelling, or asymmetry  Inspection: No masses, redness, swelling, or asymmetry  Functional ROM: Unrestricted ROM          Functional ROM: Unrestricted ROM          Muscle strength & Tone: Functionally intact  Muscle strength & Tone: Functionally intact  Sensory: Unimpaired  Sensory: Unimpaired  Palpation: Non-contributory  Palpation: Non-contributory   Thoracic Spine Exam  Inspection: No masses, redness, or swelling Alignment: Symmetrical Functional ROM: Unrestricted ROM Stability: No instability detected Sensory: Unimpaired Muscle strength & Tone: Functionally intact Palpation: Non-contributory  Lumbar Spine Exam  Inspection: No masses, redness, or swelling Alignment: Symmetrical Functional ROM: Unrestricted ROM Stability: No instability detected Muscle strength & Tone: Functionally intact Sensory: Unimpaired Palpation: Non-contributory Provocative Tests: Lumbar Hyperextension and rotation test: evaluation deferred today       Patrick's Maneuver: evaluation deferred today              Gait & Posture Assessment  Ambulation: Unassisted Gait: Relatively normal  for age and body habitus Posture: WNL   Lower Extremity Exam    Side: Right lower extremity  Side: Left lower extremity  Inspection: No masses, redness, swelling, or asymmetry  Inspection: No masses, redness, swelling, or asymmetry  Functional ROM: Unrestricted ROM          Functional ROM: Unrestricted ROM          Muscle strength & Tone: Functionally intact  Muscle strength & Tone: Functionally intact  Sensory: Unimpaired  Sensory: Unimpaired  Palpation: Non-contributory  Palpation: Non-contributory   Assessment  Primary Diagnosis & Pertinent Problem List: The primary encounter diagnosis was Lumbar facet syndrome (Location of Primary Source of Pain) (Bilateral) (R>L). Diagnoses of Chronic pain syndrome, Chronic low back pain (Location of Primary Source of Pain)  (Bilateral) (R>L), Cervical facet syndrome, Long term current use of opiate analgesic, Opiate use, Myofascial pain syndrome (Right) (cervicothoracic), Diffuse myofascial pain syndrome, Neurogenic pain, and Primary osteoarthritis involving multiple joints were also pertinent to this visit.  Status Diagnosis  Controlled Controlled Controlled 1. Lumbar facet syndrome (Location of Primary Source of Pain) (Bilateral) (R>L)   2. Chronic pain syndrome   3. Chronic low back pain (Location of Primary Source of Pain) (Bilateral) (R>L)   4. Cervical facet syndrome   5. Long term current use of opiate analgesic   6. Opiate use   7. Myofascial pain syndrome (Right) (cervicothoracic)   8. Diffuse myofascial pain syndrome   9. Neurogenic pain   10. Primary osteoarthritis involving multiple joints      Plan of Care  Pharmacotherapy (Medications Ordered): Meds ordered this encounter  Medications  . magnesium oxide (MAGNESIUM-OXIDE) 400 (241.3 Mg) MG tablet    Sig: Take 1 tablet (400 mg total) by mouth daily.    Dispense:  90 tablet    Refill:  0    Do not place this medication, or any other prescription from our practice, on "Automatic Refill". Patient may have prescription filled one day early if pharmacy is closed on scheduled refill date.  Marland Kitchen HYDROcodone-acetaminophen (NORCO/VICODIN) 5-325 MG tablet    Sig: Take 1 tablet by mouth every 8 (eight) hours as needed for severe pain.    Dispense:  90 tablet    Refill:  0    Do not place this medication, or any other prescription from our practice, on "Automatic Refill". Patient may have prescription filled one day early if pharmacy is closed on scheduled refill date. Do not fill until: 02/28/17 To last until: 03/30/17  . HYDROcodone-acetaminophen (NORCO/VICODIN) 5-325 MG tablet    Sig: Take 1 tablet by mouth every 8 (eight) hours as needed for severe pain.    Dispense:  90 tablet    Refill:  0    Do not place this medication, or any other  prescription from our practice, on "Automatic Refill". Patient may have prescription filled one day early if pharmacy is closed on scheduled refill date. Do not fill until: 03/30/17 To last until: 04/29/17  . HYDROcodone-acetaminophen (NORCO/VICODIN) 5-325 MG tablet    Sig: Take 1 tablet by mouth every 8 (eight) hours as needed for severe pain.    Dispense:  90 tablet    Refill:  0    Do not place this medication, or any other prescription from our practice, on "Automatic Refill". Patient may have prescription filled one day early if pharmacy is closed on scheduled refill date. Do not fill until: 01/29/17 To last until: 02/28/17  . cyclobenzaprine (FLEXERIL) 10 MG tablet  Sig: Take 1 tablet (10 mg total) by mouth at bedtime.    Dispense:  90 tablet    Refill:  0    Do not place this medication, or any other prescription from our practice, on "Automatic Refill". Patient may have prescription filled one day early if pharmacy is closed on scheduled refill date.  . gabapentin (NEURONTIN) 300 MG capsule    Sig: Take 1 capsule (300 mg total) by mouth 2 (two) times daily.    Dispense:  180 capsule    Refill:  0    Do not place this medication, or any other prescription from our practice, on "Automatic Refill". Patient may have prescription filled one day early if pharmacy is closed on scheduled refill date.  . meloxicam (MOBIC) 7.5 MG tablet    Sig: Take 1 tablet (7.5 mg total) by mouth daily.    Dispense:  90 tablet    Refill:  0    Do not place this medication, or any other prescription from our practice, on "Automatic Refill". Patient may have prescription filled one day early if pharmacy is closed on scheduled refill date.   New Prescriptions   No medications on file   Medications administered today: Ms. Napierkowski had no medications administered during this visit. Lab-work, procedure(s), and/or referral(s): Orders Placed This Encounter  Procedures  . LUMBAR FACET(MEDIAL BRANCH NERVE  BLOCK) MBNB   Imaging and/or referral(s): None  Interventional therapies: Planned, scheduled, and/or pending:   Bilateral lumbar facet block, under fluoro and IV sedation.   Considering:   Diagnostic right-sided lumbar facet block under fluoroscopic guidance and IV sedation.  Palliative right-sided lumbar facet radiofrequency ablation under fluoroscopic guidance and IV sedation.  Palliative Right suprascapular muscle trigger point injection, without fluoroscopy or IV sedation.  Diagnostic bilateral cervical facet block under fluoroscopic guidance and IV sedation.  Possible bilateral cervical facet radiofrequency ablation under fluoroscopic guidance and IV sedation.  Diagnostic right-sided cervical epidural steroid injection under fluoroscopic guidance, with or without IV sedation.  Diagnostic intra-articular hip joint injection under fluoroscopic guidance, without IV sedation.  Possible hip joint radiofrequency ablation under fluoroscopic guidance and IV sedation.    Palliative PRN treatment(s):   Diagnostic right-sided lumbar facet block under fluoroscopic guidance and IV sedation.  Palliative right-sided lumbar facet radiofrequency ablation under fluoroscopic guidance and IV sedation.  Palliative Right suprascapular muscle trigger point injection, without fluoroscopy or IV sedation.  Diagnostic bilateral cervical facet block under fluoroscopic guidance and IV sedation.  Diagnostic right-sided cervical epidural steroid injection under fluoroscopic guidance, with or without IV sedation.  Diagnostic intra-articular hip joint injection under fluoroscopic guidance, without IV sedation.    Provider-requested follow-up: Return in about 3 months (around 04/16/2017) for (NP) Med-Mgmt, in addition, procedure (ASAA).  Future Appointments Date Time Provider [place]  01/22/2017 9:45 AM Milinda Pointer, MD [facility] None  02/05/2017 10:30 AM Einar Pheasant, MD [facility] None  03/27/2017  10:45 AM Lloyd Huger, MD [facility] None  05/02/2017 11:00 AM Noreene Filbert, MD [facility] None  10/26/2017 1:00 PM Denisa L O'Brien-Blaney, LPN [facility] None   Primary Care Physician: Einar Pheasant, MD Location: [facility] Note by: Kathlen Brunswick. Dossie Arbour, M.D, DABA, DABAPM, DABPM, DABIPP, FIPP Date: 01/16/2017; Time: 1:17 PM  Pain Score Disclaimer: We use the NRS-11 scale. This is a self-reported, subjective measurement of pain severity with only modest accuracy. It is used primarily to identify changes within a particular patient. It must be understood that outpatient pain scales are significantly  less accurate that those used for research, where they can be applied under ideal controlled circumstances with minimal exposure to variables. In reality, the score is likely to be a combination of pain intensity and pain affect, where pain affect describes the degree of emotional arousal or changes in action readiness caused by the sensory experience of pain. Factors such as social and work situation, setting, emotional state, anxiety levels, expectation, and prior pain experience may influence pain perception and show large inter-individual differences that may also be affected by time variables.  Patient instructions provided during this appointment: Patient Instructions   GENERAL RISKS AND COMPLICATIONS  What are the risk, side effects and possible complications? Generally speaking, most procedures are safe.  However, with any procedure there are risks, side effects, and the possibility of complications.  The risks and complications are dependent upon the sites that are lesioned, or the type of nerve block to be performed.  The closer the procedure is to the spine, the more serious the risks are.  Great care is taken when placing the radio frequency needles, block needles or lesioning probes, but sometimes complications can occur. 1. Infection: Any time there is  an injection through the skin, there is a risk of infection.  This is why sterile conditions are used for these blocks.  There are four possible types of infection. 1. Localized skin infection. 2. Central Nervous System Infection-This can be in the form of Meningitis, which can be deadly. 3. Epidural Infections-This can be in the form of an epidural abscess, which can cause pressure inside of the spine, causing compression of the spinal cord with subsequent paralysis. This would require an emergency surgery to decompress, and there are no guarantees that the patient would recover from the paralysis. 4. Discitis-This is an infection of the intervertebral discs.  It occurs in about 1% of discography procedures.  It is difficult to treat and it may lead to surgery.        2. Pain: the needles have to go through skin and soft tissues, will cause soreness.       3. Damage to internal structures:  The nerves to be lesioned may be near blood vessels or    other nerves which can be potentially damaged.       4. Bleeding: Bleeding is more common if the patient is taking blood thinners such as  aspirin, Coumadin, Ticiid, Plavix, etc., or if he/she have some genetic predisposition  such as hemophilia. Bleeding into the spinal canal can cause compression of the spinal  cord with subsequent paralysis.  This would require an emergency surgery to  decompress and there are no guarantees that the patient would recover from the  paralysis.       5. Pneumothorax:  Puncturing of a lung is a possibility, every time a needle is introduced in  the area of the chest or upper back.  Pneumothorax refers to free air around the  collapsed lung(s), inside of the thoracic cavity (chest cavity).  Another two possible  complications related to a similar event would include: Hemothorax and Chylothorax.   These are variations of the Pneumothorax, where instead of air around the collapsed  lung(s), you may have blood or chyle,  respectively.       6. Spinal headaches: They may occur with any procedures in the area of the spine.       7. Persistent CSF (Cerebro-Spinal Fluid) leakage: This is a rare problem, but may occur  with  prolonged intrathecal or epidural catheters either due to the formation of a fistulous  track or a dural tear.       8. Nerve damage: By working so close to the spinal cord, there is always a possibility of  nerve damage, which could be as serious as a permanent spinal cord injury with  paralysis.       9. Death:  Although rare, severe deadly allergic reactions known as "Anaphylactic  reaction" can occur to any of the medications used.      10. Worsening of the symptoms:  We can always make thing worse.  What are the chances of something like this happening? Chances of any of this occuring are extremely low.  By statistics, you have more of a chance of getting killed in a motor vehicle accident: while driving to the hospital than any of the above occurring .  Nevertheless, you should be aware that they are possibilities.  In general, it is similar to taking a shower.  Everybody knows that you can slip, hit your head and get killed.  Does that mean that you should not shower again?  Nevertheless always keep in mind that statistics do not mean anything if you happen to be on the wrong side of them.  Even if a procedure has a 1 (one) in a 1,000,000 (million) chance of going wrong, it you happen to be that one..Also, keep in mind that by statistics, you have more of a chance of having something go wrong when taking medications.  Who should not have this procedure? If you are on a blood thinning medication (e.g. Coumadin, Plavix, see list of "Blood Thinners"), or if you have an active infection going on, you should not have the procedure.  If you are taking any blood thinners, please inform your physician.  How should I prepare for this procedure?  Do not eat or drink anything at least six hours prior to the  procedure.  Bring a driver with you .  It cannot be a taxi.  Come accompanied by an adult that can drive you back, and that is strong enough to help you if your legs get weak or numb from the local anesthetic.  Take all of your medicines the morning of the procedure with just enough water to swallow them.  If you have diabetes, make sure that you are scheduled to have your procedure done first thing in the morning, whenever possible.  If you have diabetes, take only half of your insulin dose and notify our nurse that you have done so as soon as you arrive at the clinic.  If you are diabetic, but only take blood sugar pills (oral hypoglycemic), then do not take them on the morning of your procedure.  You may take them after you have had the procedure.  Do not take aspirin or any aspirin-containing medications, at least eleven (11) days prior to the procedure.  They may prolong bleeding.  Wear loose fitting clothing that may be easy to take off and that you would not mind if it got stained with Betadine or blood.  Do not wear any jewelry or perfume  Remove any nail coloring.  It will interfere with some of our monitoring equipment.  NOTE: Remember that this is not meant to be interpreted as a complete list of all possible complications.  Unforeseen problems may occur.  BLOOD THINNERS The following drugs contain aspirin or other products, which can cause increased bleeding during surgery and should  not be taken for 2 weeks prior to and 1 week after surgery.  If you should need take something for relief of minor pain, you may take acetaminophen which is found in Tylenol,m Datril, Anacin-3 and Panadol. It is not blood thinner. The products listed below are.  Do not take any of the products listed below in addition to any listed on your instruction sheet.  A.P.C or A.P.C with Codeine Codeine Phosphate Capsules #3 Ibuprofen Ridaura  ABC compound Congesprin Imuran rimadil  Advil Cope Indocin  Robaxisal  Alka-Seltzer Effervescent Pain Reliever and Antacid Coricidin or Coricidin-D  Indomethacin Rufen  Alka-Seltzer plus Cold Medicine Cosprin Ketoprofen S-A-C Tablets  Anacin Analgesic Tablets or Capsules Coumadin Korlgesic Salflex  Anacin Extra Strength Analgesic tablets or capsules CP-2 Tablets Lanoril Salicylate  Anaprox Cuprimine Capsules Levenox Salocol  Anexsia-D Dalteparin Magan Salsalate  Anodynos Darvon compound Magnesium Salicylate Sine-off  Ansaid Dasin Capsules Magsal Sodium Salicylate  Anturane Depen Capsules Marnal Soma  APF Arthritis pain formula Dewitt's Pills Measurin Stanback  Argesic Dia-Gesic Meclofenamic Sulfinpyrazone  Arthritis Bayer Timed Release Aspirin Diclofenac Meclomen Sulindac  Arthritis pain formula Anacin Dicumarol Medipren Supac  Analgesic (Safety coated) Arthralgen Diffunasal Mefanamic Suprofen  Arthritis Strength Bufferin Dihydrocodeine Mepro Compound Suprol  Arthropan liquid Dopirydamole Methcarbomol with Aspirin Synalgos  ASA tablets/Enseals Disalcid Micrainin Tagament  Ascriptin Doan's Midol Talwin  Ascriptin A/D Dolene Mobidin Tanderil  Ascriptin Extra Strength Dolobid Moblgesic Ticlid  Ascriptin with Codeine Doloprin or Doloprin with Codeine Momentum Tolectin  Asperbuf Duoprin Mono-gesic Trendar  Aspergum Duradyne Motrin or Motrin IB Triminicin  Aspirin plain, buffered or enteric coated Durasal Myochrisine Trigesic  Aspirin Suppositories Easprin Nalfon Trillsate  Aspirin with Codeine Ecotrin Regular or Extra Strength Naprosyn Uracel  Atromid-S Efficin Naproxen Ursinus  Auranofin Capsules Elmiron Neocylate Vanquish  Axotal Emagrin Norgesic Verin  Azathioprine Empirin or Empirin with Codeine Normiflo Vitamin E  Azolid Emprazil Nuprin Voltaren  Bayer Aspirin plain, buffered or children's or timed BC Tablets or powders Encaprin Orgaran Warfarin Sodium  Buff-a-Comp Enoxaparin Orudis Zorpin  Buff-a-Comp with Codeine Equegesic Os-Cal-Gesic    Buffaprin Excedrin plain, buffered or Extra Strength Oxalid   Bufferin Arthritis Strength Feldene Oxphenbutazone   Bufferin plain or Extra Strength Feldene Capsules Oxycodone with Aspirin   Bufferin with Codeine Fenoprofen Fenoprofen Pabalate or Pabalate-SF   Buffets II Flogesic Panagesic   Buffinol plain or Extra Strength Florinal or Florinal with Codeine Panwarfarin   Buf-Tabs Flurbiprofen Penicillamine   Butalbital Compound Four-way cold tablets Penicillin   Butazolidin Fragmin Pepto-Bismol   Carbenicillin Geminisyn Percodan   Carna Arthritis Reliever Geopen Persantine   Carprofen Gold's salt Persistin   Chloramphenicol Goody's Phenylbutazone   Chloromycetin Haltrain Piroxlcam   Clmetidine heparin Plaquenil   Cllnoril Hyco-pap Ponstel   Clofibrate Hydroxy chloroquine Propoxyphen         Before stopping any of these medications, be sure to consult the physician who ordered them.  Some, such as Coumadin (Warfarin) are ordered to prevent or treat serious conditions such as "deep thrombosis", "pumonary embolisms", and other heart problems.  The amount of time that you may need off of the medication may also vary with the medication and the reason for which you were taking it.  If you are taking any of these medications, please make sure you notify your pain physician before you undergo any procedures.         Facet Joint Block The facet joints connect the bones of the spine (vertebrae). They make it possible for you to  bend, twist, and make other movements with your spine. They also prevent you from overbending, overtwisting, and making other excessive movements.  A facet joint block is a procedure where a numbing medicine (anesthetic) is injected into a facet joint. Often, a type of anti-inflammatory medicine called a steroid is also injected. A facet joint block may be done for two reasons:   Diagnosis. A facet joint block may be done as a test to see whether neck or back pain is  caused by a worn-down or infected facet joint. If the pain gets better after a facet joint block, it means the pain is probably coming from the facet joint. If the pain does not get better, it means the pain is probably not coming from the facet joint.   Therapy. A facet joint block may be done to relieve neck or back pain caused by a facet joint. A facet joint block is only done as a therapy if the pain does not improve with medicine, exercise programs, physical therapy, and other forms of pain management. LET [facility] CARE PROVIDER KNOW ABOUT:   Any allergies you have.   All medicines you are taking, including vitamins, herbs, eyedrops, and over-the-counter medicines and creams.   Previous problems you or members of your family have had with the use of anesthetics.   Any blood disorders you have had.   Other health problems you have. RISKS AND COMPLICATIONS Generally, having a facet joint block is safe. However, as with any procedure, complications can occur. Possible complications associated with having a facet joint block include:   Bleeding.   Injury to a nerve near the injection site.   Pain at the injection site.   Weakness or numbness in areas controlled by nerves near the injection site.   Infection.   Temporary fluid retention.   Allergic reaction to anesthetics or medicines used during the procedure. BEFORE THE PROCEDURE   Follow your health care provider's instructions if you are taking dietary supplements or medicines. You may need to stop taking them or reduce your dosage.   Do not take any new dietary supplements or medicines without asking your health care provider first.   Follow your health care provider's instructions about eating and drinking before the procedure. You may need to stop eating and drinking several hours before the procedure.   Arrange to have an adult drive you home after the procedure. PROCEDURE  You may need to remove your  clothing and dress in an open-back gown so that your health care provider can access your spine.   The procedure will be done while you are lying on an X-ray table. Most of the time you will be asked to lie on your stomach, but you may be asked to lie in a different position if an injection will be made in your neck.   Special machines will be used to monitor your oxygen levels, heart rate, and blood pressure.   If an injection will be made in your neck, an intravenous (IV) tube will be inserted into one of your veins. Fluids and medicine will flow directly into your body through the IV tube.   The area over the facet joint where the injection will be made will be cleaned with an antiseptic soap. The surrounding skin will be covered with sterile drapes.   An anesthetic will be applied to your skin to make the injection area numb. You may feel a temporary stinging or burning sensation.   A video  X-ray machine will be used to locate the joint. A contrast dye may be injected into the facet joint area to help with locating the joint.   When the joint is located, an anesthetic medicine will be injected into the joint through the needle.   Your health care provider will ask you whether you feel pain relief. If you do feel relief, a steroid may be injected to provide pain relief for a longer period of time. If you do not feel relief or feel only partial relief, additional injections of an anesthetic may be made in other facet joints.   The needle will be removed, the skin will be cleansed, and bandages will be applied.  AFTER THE PROCEDURE   You will be observed for 15-30 minutes before being allowed to go home. Do not drive. Have an adult drive you or take a taxi or public transportation instead.   If you feel pain relief, the pain will return in several hours or days when the anesthetic wears off.   You may feel pain relief 2-14 days after the procedure. The amount of time this relief  lasts varies from person to person.   It is normal to feel some tenderness over the injected area(s) for 2 days following the procedure.   If you have diabetes, you may have a temporary increase in blood sugar. This information is not intended to replace advice given to you by your health care provider. Make sure you discuss any questions you have with your health care provider. Document Released: 04/25/2007 Document Revised: 12/25/2014 Document Reviewed: 08/30/2015 [employer]vier Interactive Patient Education  2017 [employer].

## 2017-01-22 ENCOUNTER — Ambulatory Visit
Admission: RE | Admit: 2017-01-22 | Discharge: 2017-01-22 | Disposition: A | Discharge status: Home / Self Care | Payer: Medicare Other | Source: Ambulatory Visit | Attending: Pain Medicine | Admitting: Pain Medicine

## 2017-01-22 ENCOUNTER — Ambulatory Visit (HOSPITAL_BASED_OUTPATIENT_CLINIC_OR_DEPARTMENT_OTHER): Payer: Medicare Other | Admitting: Pain Medicine

## 2017-01-22 ENCOUNTER — Encounter: Payer: Self-pay | Admitting: Pain Medicine

## 2017-01-22 VITALS — BP 185/80 | HR 61 | Temp 97.8°F | Resp 16 | Ht 64.0 in | Wt 179.0 lb

## 2017-01-22 DIAGNOSIS — M545 Low back pain, unspecified: Secondary | ICD-10-CM

## 2017-01-22 DIAGNOSIS — M5386 Other specified dorsopathies, lumbar region: Secondary | ICD-10-CM | POA: Diagnosis not present

## 2017-01-22 DIAGNOSIS — M1288 Other specific arthropathies, not elsewhere classified, other specified site: Secondary | ICD-10-CM | POA: Insufficient documentation

## 2017-01-22 DIAGNOSIS — G8929 Other chronic pain: Secondary | ICD-10-CM | POA: Diagnosis not present

## 2017-01-22 DIAGNOSIS — M4306 Spondylolysis, lumbar region: Secondary | ICD-10-CM | POA: Diagnosis not present

## 2017-01-22 DIAGNOSIS — M47816 Spondylosis without myelopathy or radiculopathy, lumbar region: Secondary | ICD-10-CM

## 2017-01-22 MED ORDER — ROPIVACAINE HCL 2 MG/ML IJ SOLN
9.0000 mL | Freq: Once | INTRAMUSCULAR | Status: AC
Start: 2017-01-22 — End: 2017-01-22
  Administered 2017-01-22: 9 mL via PERINEURAL

## 2017-01-22 MED ORDER — FENTANYL CITRATE (PF) 100 MCG/2ML IJ SOLN: 25.0000 ug | INTRAMUSCULAR | Status: DC | PRN

## 2017-01-22 MED ORDER — ROPIVACAINE HCL 2 MG/ML IJ SOLN
9.0000 mL | Freq: Once | INTRAMUSCULAR | Status: AC
  Administered 2017-01-22: 9 mL via PERINEURAL
  Filled 2017-01-22: qty 20

## 2017-01-22 MED ORDER — LIDOCAINE HCL (PF) 1 % IJ SOLN
10.0000 mL | Freq: Once | INTRAMUSCULAR | Status: AC
  Administered 2017-01-22: 10 mL

## 2017-01-22 MED ORDER — TRIAMCINOLONE ACETONIDE 40 MG/ML IJ SUSP
40.0000 mg | Freq: Once | INTRAMUSCULAR | Status: AC
  Administered 2017-01-22: 40 mg

## 2017-01-22 MED ORDER — TRIAMCINOLONE ACETONIDE 40 MG/ML IJ SUSP
INTRAMUSCULAR | Status: AC
  Administered 2017-01-22: 11:00:00
  Filled 2017-01-22: qty 2

## 2017-01-22 MED ORDER — MIDAZOLAM HCL 5 MG/5ML IJ SOLN: 1.0000 mg | INTRAMUSCULAR | Status: DC | PRN

## 2017-01-22 MED ORDER — LACTATED RINGERS IV SOLN
1000.0000 mL | Freq: Once | INTRAVENOUS | Status: AC
  Administered 2017-01-22: 1000 mL via INTRAVENOUS

## 2017-01-22 MED ORDER — FENTANYL CITRATE (PF) 100 MCG/2ML IJ SOLN
INTRAMUSCULAR | Status: AC
  Administered 2017-01-22: 50 ug via INTRAVENOUS
  Filled 2017-01-22: qty 2

## 2017-01-22 MED ORDER — MIDAZOLAM HCL 5 MG/5ML IJ SOLN
INTRAMUSCULAR | Status: AC
  Administered 2017-01-22: 2 mg via INTRAVENOUS
  Filled 2017-01-22: qty 5

## 2017-01-22 NOTE — Patient Instructions (Addendum)
Pain Management Discharge Instructions  General Discharge Instructions :  If you need to reach your doctor call: Monday-Friday 8:00 am - 4:00 pm at [phone] or toll free [phone].  After clinic hours [phone] to have operator reach doctor.  Bring all of your medication bottles to all your appointments in the pain clinic.  To cancel or reschedule your appointment with Pain Management please remember to call 24 hours in advance to avoid a fee.  Refer to the educational materials which you have been given on: General Risks, I had my Procedure. Discharge Instructions, Post Sedation.  Post Procedure Instructions:  The drugs you were given will stay in your system until tomorrow, so for the next 24 hours you should not drive, make any legal decisions or drink any alcoholic beverages.  You may eat anything you prefer, but it is better to start with liquids then soups and crackers, and gradually work up to solid foods.  Please notify your doctor immediately if you have any unusual bleeding, trouble breathing or pain that is not related to your normal pain.  Depending on the type of procedure that was done, some parts of your body may feel week and/or numb.  This usually clears up by tonight or the next day.  Walk with the use of an assistive device or accompanied by an adult for the 24 hours.  You may use ice on the affected area for the first 24 hours.  Put ice in a Ziploc bag and cover with a towel and place against area 15 minutes on 15 minutes off.  You may switch to heat after 24 hours.Facet Joint Block, Care After Refer to this sheet in the next few weeks. These instructions provide you with information on caring for yourself after your procedure. Your health care provider may also give you more specific instructions. Your treatment has been planned according to current medical practices, but problems sometimes occur. Call your health care provider if you have any problems or  questions after your procedure. HOME CARE INSTRUCTIONS   Keep track of the amount of pain relief you feel and how long it lasts.  Limit pain medicine within the first 4-6 hours after the procedure as directed by your health care provider.  Resume taking dietary supplements and medicines as directed by your health care provider.  You may resume your regular diet.  Do not apply heat near or over the injection site(s) for 24 hours.   Do not take a bath or soak in water (such as a pool or lake) for 24 hours.  Do not drive for 24 hours unless approved by your health care provider.  Avoid strenuous activity for 24 hours.  Remove your bandages the morning after the procedure.   If the injection site is tender, applying an ice pack may relieve some tenderness. To do this:  Put ice in a bag.  Place a towel between your skin and the bag.  Leave the ice on for 15-20 minutes, 3-4 times a day.  Keep follow-up appointments as directed by your health care provider. SEEK MEDICAL CARE IF:   Your pain is not controlled by your medicines.   There is drainage from the injection site.   There is significant bleeding or swelling at the injection site.  You have diabetes and your blood sugar is above 180 mg/dL. SEEK IMMEDIATE MEDICAL CARE IF:   You develop a fever of 101F (38.3C) or greater.   You have worsening pain or swelling around   the injection site.   You have red streaking around the injection site.   You develop severe pain that is not controlled by your medicines.   You develop a headache, stiff neck, nausea, or vomiting.   Your eyes become very sensitive to light.   You have weakness, paralysis, or tingling in your arms or legs that was not present before the procedure.   You develop difficulty urinating or breathing.  This information is not intended to replace advice given to you by your health care provider. Make sure you discuss any questions you have  with your health care provider. Document Released: 11/20/2012 Document Revised: 12/25/2014 Document Reviewed: 08/30/2015 Elsevier Interactive Patient Education  2017 [employer]  What are the risk, side effects and possible complications? Generally speaking, most procedures are safe.  However, with any procedure there are risks, side effects, and the possibility of complications.  The risks and complications are dependent upon the sites that are lesioned, or the type of nerve block to be performed.  The closer the procedure is to the spine, the more serious the risks are.  Great care is taken when placing the radio frequency needles, block needles or lesioning probes, but sometimes complications can occur. Infection: Any time there is an injection through the skin, there is a risk of infection.  This is why sterile conditions are used for these blocks.  There are four possible types of infection. Localized skin infection. Central Nervous System Infection-This can be in the form of Meningitis, which can be deadly. Epidural Infections-This can be in the form of an epidural abscess, which can cause pressure inside of the spine, causing compression of the spinal cord with subsequent paralysis. This would require an emergency surgery to decompress, and there are no guarantees that the patient would recover from the paralysis. Discitis-This is an infection of the intervertebral discs.  It occurs in about 1% of discography procedures.  It is difficult to treat and it may lead to surgery.        2. Pain: the needles have to go through skin and soft tissues, will cause soreness.       3. Damage to internal structures:  The nerves to be lesioned may be near blood vessels or    other nerves which can be potentially damaged.       4. Bleeding: Bleeding is more common if the patient is taking blood thinners such as  aspirin, Coumadin, Ticiid, Plavix, etc., or if he/she have some  genetic predisposition  such as hemophilia. Bleeding into the spinal canal can cause compression of the spinal  cord with subsequent paralysis.  This would require an emergency surgery to  decompress and there are no guarantees that the patient would recover from the  paralysis.       5. Pneumothorax:  Puncturing of a lung is a possibility, every time a needle is introduced in  the area of the chest or upper back.  Pneumothorax refers to free air around the  collapsed lung(s), inside of the thoracic cavity (chest cavity).  Another two possible  complications related to a similar event would include: Hemothorax and Chylothorax.   These are variations of the Pneumothorax, where instead of air around the collapsed  lung(s), you may have blood or chyle, respectively.       6. Spinal headaches: They may occur with any procedures in the area of the spine.       7. Persistent CSF (  Cerebro-Spinal Fluid) leakage: This is a rare problem, but may occur  with prolonged intrathecal or epidural catheters either due to the formation of a fistulous  track or a dural tear.       8. Nerve damage: By working so close to the spinal cord, there is always a possibility of  nerve damage, which could be as serious as a permanent spinal cord injury with  paralysis.       9. Death:  Although rare, severe deadly allergic reactions known as "Anaphylactic  reaction" can occur to any of the medications used.      10. Worsening of the symptoms:  We can always make thing worse.  What are the chances of something like this happening? Chances of any of this occuring are extremely low.  By statistics, you have more of a chance of getting killed in a motor vehicle accident: while driving to the hospital than any of the above occurring .  Nevertheless, you should be aware that they are possibilities.  In general, it is similar to taking a shower.  Everybody knows that you can slip, hit your head and get killed.  Does that mean that you should  not shower again?  Nevertheless always keep in mind that statistics do not mean anything if you happen to be on the wrong side of them.  Even if a procedure has a 1 (one) in a 1,000,000 (million) chance of going wrong, it you happen to be that one..Also, keep in mind that by statistics, you have more of a chance of having something go wrong when taking medications.  Who should not have this procedure? If you are on a blood thinning medication (e.g. Coumadin, Plavix, see list of "Blood Thinners"), or if you have an active infection going on, you should not have the procedure.  If you are taking any blood thinners, please inform your physician.  How should I prepare for this procedure? Do not eat or drink anything at least six hours prior to the procedure. Bring a driver with you .  It cannot be a taxi. Come accompanied by an adult that can drive you back, and that is strong enough to help you if your legs get weak or numb from the local anesthetic. Take all of your medicines the morning of the procedure with just enough water to swallow them. If you have diabetes, make sure that you are scheduled to have your procedure done first thing in the morning, whenever possible. If you have diabetes, take only half of your insulin dose and notify our nurse that you have done so as soon as you arrive at the clinic. If you are diabetic, but only take blood sugar pills (oral hypoglycemic), then do not take them on the morning of your procedure.  You may take them after you have had the procedure. Do not take aspirin or any aspirin-containing medications, at least eleven (11) days prior to the procedure.  They may prolong bleeding. Wear loose fitting clothing that may be easy to take off and that you would not mind if it got stained with Betadine or blood. Do not wear any jewelry or perfume Remove any nail coloring.  It will interfere with some of our monitoring equipment.  NOTE: Remember that this is not meant  to be interpreted as a complete list of all possible complications.  Unforeseen problems may occur.  BLOOD THINNERS The following drugs contain aspirin or other products, which can cause increased bleeding during  surgery and should not be taken for 2 weeks prior to and 1 week after surgery.  If you should need take something for relief of minor pain, you may take acetaminophen which is found in Tylenol,m Datril, Anacin-3 and Panadol. It is not blood thinner. The products listed below are.  Do not take any of the products listed below in addition to any listed on your instruction sheet.  A.P.C or A.P.C with Codeine Codeine Phosphate Capsules #3 Ibuprofen Ridaura  ABC compound Congesprin Imuran rimadil  Advil Cope Indocin Robaxisal  Alka-Seltzer Effervescent Pain Reliever and Antacid Coricidin or Coricidin-D  Indomethacin Rufen  Alka-Seltzer plus Cold Medicine Cosprin Ketoprofen S-A-C Tablets  Anacin Analgesic Tablets or Capsules Coumadin Korlgesic Salflex  Anacin Extra Strength Analgesic tablets or capsules CP-2 Tablets Lanoril Salicylate  Anaprox Cuprimine Capsules Levenox Salocol  Anexsia-D Dalteparin Magan Salsalate  Anodynos Darvon compound Magnesium Salicylate Sine-off  Ansaid Dasin Capsules Magsal Sodium Salicylate  Anturane Depen Capsules Marnal Soma  APF Arthritis pain formula Dewitt's Pills Measurin Stanback  Argesic Dia-Gesic Meclofenamic Sulfinpyrazone  Arthritis Bayer Timed Release Aspirin Diclofenac Meclomen Sulindac  Arthritis pain formula Anacin Dicumarol Medipren Supac  Analgesic (Safety coated) Arthralgen Diffunasal Mefanamic Suprofen  Arthritis Strength Bufferin Dihydrocodeine Mepro Compound Suprol  Arthropan liquid Dopirydamole Methcarbomol with Aspirin Synalgos  ASA tablets/Enseals Disalcid Micrainin Tagament  Ascriptin Doan's Midol Talwin  Ascriptin A/D Dolene Mobidin Tanderil  Ascriptin Extra Strength Dolobid Moblgesic Ticlid  Ascriptin with Codeine Doloprin or  Doloprin with Codeine Momentum Tolectin  Asperbuf Duoprin Mono-gesic Trendar  Aspergum Duradyne Motrin or Motrin IB Triminicin  Aspirin plain, buffered or enteric coated Durasal Myochrisine Trigesic  Aspirin Suppositories Easprin Nalfon Trillsate  Aspirin with Codeine Ecotrin Regular or Extra Strength Naprosyn Uracel  Atromid-S Efficin Naproxen Ursinus  Auranofin Capsules Elmiron Neocylate Vanquish  Axotal Emagrin Norgesic Verin  Azathioprine Empirin or Empirin with Codeine Normiflo Vitamin E  Azolid Emprazil Nuprin Voltaren  Bayer Aspirin plain, buffered or children's or timed BC Tablets or powders Encaprin Orgaran Warfarin Sodium  Buff-a-Comp Enoxaparin Orudis Zorpin  Buff-a-Comp with Codeine Equegesic Os-Cal-Gesic   Buffaprin Excedrin plain, buffered or Extra Strength Oxalid   Bufferin Arthritis Strength Feldene Oxphenbutazone   Bufferin plain or Extra Strength Feldene Capsules Oxycodone with Aspirin   Bufferin with Codeine Fenoprofen Fenoprofen Pabalate or Pabalate-SF   Buffets II Flogesic Panagesic   Buffinol plain or Extra Strength Florinal or Florinal with Codeine Panwarfarin   Buf-Tabs Flurbiprofen Penicillamine   Butalbital Compound Four-way cold tablets Penicillin   Butazolidin Fragmin Pepto-Bismol   Carbenicillin Geminisyn Percodan   Carna Arthritis Reliever Geopen Persantine   Carprofen Gold's salt Persistin   Chloramphenicol Goody's Phenylbutazone   Chloromycetin Haltrain Piroxlcam   Clmetidine heparin Plaquenil   Cllnoril Hyco-pap Ponstel   Clofibrate Hydroxy chloroquine Propoxyphen         Before stopping any of these medications, be sure to consult the physician who ordered them.  Some, such as Coumadin (Warfarin) are ordered to prevent or treat serious conditions such as "deep thrombosis", "pumonary embolisms", and other heart problems.  The amount of time that you may need off of the medication may also vary with the medication and the reason for which you were  taking it.  If you are taking any of these medications, please make sure you notify your pain physician before you undergo any procedures.

## 2017-01-22 NOTE — Progress Notes (Signed)
Patient's Name: Wanda Martinez  MRN: [id number]  Referring Provider: Milinda Pointer, MD  DOB: 1935-07-25  PCP: Einar Pheasant, MD  DOS: 01/22/2017  Note by: Kathlen Brunswick. Dossie Arbour, MD  Service setting: Ambulatory outpatient  Location: [facility] (AMB) Pain Management Facility  Visit type: Procedure  Specialty: Interventional Pain Management  Patient type: Established   Primary Reason for Visit: Interventional Pain Management Treatment. CC: Back Pain (low, bilateral)  Procedure:  Anesthesia, Analgesia, Anxiolysis:  Type: Diagnostic Medial Branch Facet Block Region: Lumbar Level: L2, L3, L4, L5, & S1 Medial Branch Level(s) Laterality: Bilateral  Type: Local Anesthesia with Moderate (Conscious) Sedation Local Anesthetic: Lidocaine 1% Route: Intravenous (IV) IV Access: Secured Sedation: Meaningful verbal contact was maintained at all times during the procedure  Indication(s): Analgesia and Anxiety  Indications: 1. Lumbar facet syndrome (Location of Primary Source of Pain) (Bilateral) (R>L)   2. Lumbar spondylosis   3. Chronic low back pain (Location of Primary Source of Pain) (Bilateral) (R>L)    Pain Score: Pre-procedure: 5 /10 Post-procedure: 0-No pain/10  Pre-op Assessment:  Previous date of service: 01/16/17 Service provided: Med Refill Wanda Martinez is a 82 y.o. (year old), female patient, seen today for interventional treatment. She  has a past surgical history that includes Breast biopsy (2013); Tonsillectomy and adenoidectomy (79); Vesicovaginal fistula closure w/ TAH (1983); Breast surgery (1986); Breast enhancement surgery (1987); Breast implant removal; Breast implant removal (Right, 08/29/2012); Mastectomy (08/2012); Abdominal hysterectomy; and Colonoscopy with propofol (N/A, 09/13/2016). Her primarily concern today is the Back Pain (low, bilateral)  Initial Vital Signs: Blood pressure 131/73, pulse 66, temperature 98.5 F (36.9 C), resp. rate 18, height 5\' 4"  (1.626 m), weight 179  lb (81.2 kg), last menstrual period 12/18/1981, SpO2 100 %. BMI: 30.73 kg/m  Risk Assessment: Allergies: Reviewed. She is allergic to vesicare [solifenacin].  Allergy Precautions: None required Coagulopathies: "Reviewed. None identified.  Blood-thinner therapy: None at this time Active Infection(s): Reviewed. None identified. Wanda Martinez is afebrile  Site Confirmation: Wanda Martinez was asked to confirm the procedure and laterality before marking the site Procedure checklist: Completed Consent: Before the procedure and under the influence of no sedative(s), amnesic(s), or anxiolytics, the patient was informed of the treatment options, risks and possible complications. To fulfill our ethical and legal obligations, as recommended by the American Medical Association's Code of Ethics, I have informed the patient of my clinical impression; the nature and purpose of the treatment or procedure; the risks, benefits, and possible complications of the intervention; the alternatives, including doing nothing; the risk(s) and benefit(s) of the alternative treatment(s) or procedure(s); and the risk(s) and benefit(s) of doing nothing. The patient was provided information about the general risks and possible complications associated with the procedure. These may include, but are not limited to: failure to achieve desired goals, infection, bleeding, organ or nerve damage, allergic reactions, paralysis, and death. In addition, the patient was informed of those risks and complications associated to Spine-related procedures, such as failure to decrease pain; infection (i.e.: Meningitis, epidural or intraspinal abscess); bleeding (i.e.: epidural hematoma, subarachnoid hemorrhage, or any other type of intraspinal or peri-dural bleeding); organ or nerve damage (i.e.: Any type of peripheral nerve, nerve root, or spinal cord injury) with subsequent damage to sensory, motor, and/or autonomic systems, resulting in permanent pain,  numbness, and/or weakness of one or several areas of the body; allergic reactions; (i.e.: anaphylactic reaction); and/or death. Furthermore, the patient was informed of those risks and complications associated with the medications. These include, but are  not limited to: allergic reactions (i.e.: anaphylactic or anaphylactoid reaction(s)); adrenal axis suppression; blood sugar elevation that in diabetics may result in ketoacidosis or comma; water retention that in patients with history of congestive heart failure may result in shortness of breath, pulmonary edema, and decompensation with resultant heart failure; weight gain; swelling or edema; medication-induced neural toxicity; particulate matter embolism and blood vessel occlusion with resultant organ, and/or nervous system infarction; and/or aseptic necrosis of one or more joints. Finally, the patient was informed that Medicine is not an exact science; therefore, there is also the possibility of unforeseen or unpredictable risks and/or possible complications that may result in a catastrophic outcome. The patient indicated having understood very clearly. We have given the patient no guarantees and we have made no promises. Enough time was given to the patient to ask questions, all of which were answered to the patient's satisfaction. Wanda Martinez has indicated that she wanted to continue with the procedure. Attestation: I, the ordering provider, attest that I have discussed with the patient the benefits, risks, side-effects, alternatives, likelihood of achieving goals, and potential problems during recovery for the procedure that I have provided informed consent. Date: 01/22/2017; Time: 10:31 AM  Pre-Procedure Preparation:  Monitoring: As per clinic protocol. Respiration, ETCO2, SpO2, BP, heart rate and rhythm monitor placed and checked for adequate function Safety Precautions: Patient was assessed for positional comfort and pressure points before starting the  procedure. Time-out: I initiated and conducted the "Time-out" before starting the procedure, as per protocol. The patient was asked to participate by confirming the accuracy of the "Time Out" information. Verification of the correct person, site, and procedure were performed and confirmed by me, the nursing staff, and the patient. "Time-out" conducted as per Joint Commission's Universal Protocol (UP.01.01.01). "Time-out" Date & Time: 01/22/2017; 1117 hrs.  Description of Procedure Process:   Position: Prone Target Area: For Lumbar Facet blocks, the target is the groove formed by the junction of the transverse process and superior articular process. For the L5 dorsal ramus, the target is the notch between superior articular process and sacral ala. For the S1 dorsal ramus, the target is the superior and lateral edge of the posterior S1 Sacral foramen. Approach: Paramedial approach. Area Prepped: Entire Posterior Lumbosacral Region Prepping solution: ChloraPrep (2% chlorhexidine gluconate and 70% isopropyl alcohol) Safety Precautions: Aspiration looking for blood return was conducted prior to all injections. At no point did we inject any substances, as a needle was being advanced. No attempts were made at seeking any paresthesias. Safe injection practices and needle disposal techniques used. Medications properly checked for expiration dates. SDV (single dose vial) medications used. Description of the Procedure: Protocol guidelines were followed. The patient was placed in position over the fluoroscopy table. The target area was identified and the area prepped in the usual manner. Skin desensitized using vapocoolant spray. Skin & deeper tissues infiltrated with local anesthetic. Appropriate amount of time allowed to pass for local anesthetics to take effect. The procedure needle was introduced through the skin, ipsilateral to the reported pain, and advanced to the target area. Employing the "Medial Branch  Technique", the needles were advanced to the angle made by the superior and medial portion of the transverse process, and the lateral and inferior portion of the superior articulating process of the targeted vertebral bodies. This area is known as "Burton's Eye" or the "Eye of the [place] Dog". A procedure needle was introduced through the skin, and this time advanced to the angle made by the  superior and medial border of the sacral ala, and the lateral border of the S1 vertebral body. This last needle was later repositioned at the superior and lateral border of the posterior S1 foramen. Negative aspiration confirmed. Solution injected in intermittent fashion, asking for systemic symptoms every 0.5cc of injectate. The needles were then removed and the area cleansed, making sure to leave some of the prepping solution back to take advantage of its long term bactericidal properties. Start Time: 1118 hrs. End Time: 1125 hrs.  Illustration of the posterior view of the lumbar spine and the posterior neural structures. Laminae of L2 through S1 are labeled. DPRL5, dorsal primary ramus of L5; DPRS1, dorsal primary ramus of S1; DPR3, dorsal primary ramus of L3; FJ, facet (zygapophyseal) joint L3-L4; I, inferior articular process of L4; LB1, lateral branch of dorsal primary ramus of L1; IAB, inferior articular branches from L3 medial branch (supplies L4-L5 facet joint); IBP, intermediate branch plexus; MB3, medial branch of dorsal primary ramus of L3; NR3, third lumbar nerve root; S, superior articular process of L5; SAB, superior articular branches from L4 (supplies L4-5 facet joint also); TP3, transverse process of L3.  Materials:  Needle(s) Type: Epidural needle Gauge: 22G Length: 3.5-in Medication(s): We administered lactated ringers, triamcinolone acetonide, lidocaine (PF), triamcinolone acetonide, ropivacaine (PF) 2 mg/mL (0.2%), ropivacaine (PF) 2 mg/mL (0.2%), triamcinolone acetonide, fentaNYL, and  midazolam. Please see chart orders for dosing details.  Imaging Guidance (Spinal):  Type of Imaging Technique: Fluoroscopy Guidance (Spinal) Indication(s): Assistance in needle guidance and placement for procedures requiring needle placement in or near specific anatomical locations not easily accessible without such assistance. Exposure Time: Please see nurses notes. Contrast: None used. Fluoroscopic Guidance: I was personally present during the use of fluoroscopy. "Tunnel Vision Technique" used to obtain the best possible view of the target area. Parallax error corrected before commencing the procedure. "Direction-depth-direction" technique used to introduce the needle under continuous pulsed fluoroscopy. Once target was reached, antero-posterior, oblique, and lateral fluoroscopic projection used confirm needle placement in all planes. Images permanently stored in EMR. Interpretation: No contrast injected. I personally interpreted the imaging intraoperatively. Adequate needle placement confirmed in multiple planes. Permanent images saved into the patient's record.  Antibiotic Prophylaxis:  Indication(s): None identified Antibiotic given: None  Post-operative Assessment:  EBL: None Complications: No immediate post-treatment complications observed by team, or reported by patient. Note: The patient tolerated the entire procedure well. A repeat set of vitals were taken after the procedure and the patient was kept under observation following institutional policy, for this type of procedure. Post-procedural neurological assessment was performed, showing return to baseline, prior to discharge. The patient was provided with post-procedure discharge instructions, including a section on how to identify potential problems. Should any problems arise concerning this procedure, the patient was given instructions to immediately contact us, at any time, without hesitation. In any case, we plan to contact the  patient by telephone for a follow-up status report regarding this interventional procedure. Comments:  No additional relevant information.  Plan of Care  Disposition: Discharge home  Discharge Date & Time: 01/22/2017; 1200 hrs.  Physician-requested Follow-up:  Return in about 2 weeks (around 02/05/2017) for Post-Procedure evaluation.  Future Appointments Date Time Provider [place]  02/05/2017 10:30 AM Einar Pheasant, MD [facility] None  02/20/2017 1:30 PM Milinda Pointer, MD [facility] None  03/27/2017 10:45 AM Lloyd Huger, MD [facility] None  05/02/2017 11:00 AM Noreene Filbert, MD [facility] None  10/26/2017 1:00 PM Denisa L O'Brien-Blaney, LPN [facility] None  Medications ordered for procedure: Meds ordered this encounter  Medications  . fentaNYL (SUBLIMAZE) injection 25-50 mcg    Make sure Narcan is available in the pyxis when using this medication. In the event of respiratory depression (RR< 8/min): Titrate NARCAN (naloxone) in increments of 0.1 to 0.2 mg IV at 2-3 minute intervals, until desired degree of reversal.  . lactated ringers infusion 1,000 mL  . midazolam (VERSED) 5 MG/5ML injection 1-2 mg    Make sure Flumazenil is available in the pyxis when using this medication. If oversedation occurs, administer 0.2 mg IV over 15 sec. If after 45 sec no response, administer 0.2 mg again over 1 min; may repeat at 1 min intervals; not to exceed 4 doses (1 mg)  . triamcinolone acetonide (KENALOG-40) injection 40 mg  . lidocaine (PF) (XYLOCAINE) 1 % injection 10 mL  . triamcinolone acetonide (KENALOG-40) injection 40 mg  . ropivacaine (PF) 2 mg/mL (0.2%) (NAROPIN) injection 9 mL  . ropivacaine (PF) 2 mg/mL (0.2%) (NAROPIN) injection 9 mL  . triamcinolone acetonide (KENALOG-40) 40 MG/ML injection    Sharlett Iles, Delores: cabinet override  . fentaNYL (SUBLIMAZE) 100 MCG/2ML injection    Sharlett Iles, Delores: cabinet override  . midazolam (VERSED) 5 MG/5ML injection     Sharlett Iles, Delores: cabinet override   Medications administered: We administered lactated ringers, triamcinolone acetonide, lidocaine (PF), triamcinolone acetonide, ropivacaine (PF) 2 mg/mL (0.2%), ropivacaine (PF) 2 mg/mL (0.2%), triamcinolone acetonide, fentaNYL, and midazolam.  See the medical record for exact dosing, route, and time of administration.  Lab-work, Procedure(s), & Referral(s) Ordered: Orders Placed This Encounter  Procedures  . DG C-Arm 1-60 Min-No Report  . Discharge instructions  . Follow-up  . Informed Consent Details: Transcribe to consent form and obtain patient signature  . Provider attestation of informed consent for procedure/surgical case  . Verify informed consent   Imaging Ordered: No results found for this or any previous visit. New Prescriptions   No medications on file   Primary Care Physician: Einar Pheasant, MD Location: [facility] Note by: Kathlen Brunswick. Dossie Arbour, M.D, DABA, DABAPM, DABPM, DABIPP, FIPP Date: 01/22/2017; Time: 12:00 PM  Disclaimer:  Medicine is not an Chief Strategy Officer. The only guarantee in medicine is that nothing is guaranteed. It is important to note that the decision to proceed with this intervention was based on the information collected from the patient. The Data and conclusions were drawn from the patient's questionnaire, the interview, and the physical examination. Because the information was provided in large part by the patient, it cannot be guaranteed that it has not been purposely or unconsciously manipulated. Every effort has been made to obtain as much relevant data as possible for this evaluation. It is important to note that the conclusions that lead to this procedure are derived in large part from the available data. Always take into account that the treatment will also be dependent on availability of resources and existing treatment guidelines, considered by other Pain Management Practitioners as  being common knowledge and practice, at the time of the intervention. For Medico-Legal purposes, it is also important to point out that variation in procedural techniques and pharmacological choices are the acceptable norm. The indications, contraindications, technique, and results of the above procedure should only be interpreted and judged by a Board-Certified Interventional Pain Specialist with extensive familiarity and expertise in the same exact procedure and technique. Attempts at providing opinions without similar or greater experience and expertise than that of the treating physician will be considered as  inappropriate and unethical, and shall result in a formal complaint to the state medical board and applicable specialty societies.  Instructions provided at this appointment: Patient Instructions   Pain Management Discharge Instructions  General Discharge Instructions :  If you need to reach your doctor call: Monday-Friday 8:00 am - 4:00 pm at [phone] or toll free [phone].  After clinic hours [phone] to have operator reach doctor.  Bring all of your medication bottles to all your appointments in the pain clinic.  To cancel or reschedule your appointment with Pain Management please remember to call 24 hours in advance to avoid a fee.  Refer to the educational materials which you have been given on: General Risks, I had my Procedure. Discharge Instructions, Post Sedation.  Post Procedure Instructions:  The drugs you were given will stay in your system until tomorrow, so for the next 24 hours you should not drive, make any legal decisions or drink any alcoholic beverages.  You may eat anything you prefer, but it is better to start with liquids then soups and crackers, and gradually work up to solid foods.  Please notify your doctor immediately if you have any unusual bleeding, trouble breathing or pain that is not related to your normal pain.  Depending on the type of  procedure that was done, some parts of your body may feel week and/or numb.  This usually clears up by tonight or the next day.  Walk with the use of an assistive device or accompanied by an adult for the 24 hours.  You may use ice on the affected area for the first 24 hours.  Put ice in a Ziploc bag and cover with a towel and place against area 15 minutes on 15 minutes off.  You may switch to heat after 24 hours.Facet Joint Block, Care After Refer to this sheet in the next few weeks. These instructions provide you with information on caring for yourself after your procedure. Your health care provider may also give you more specific instructions. Your treatment has been planned according to current medical practices, but problems sometimes occur. Call your health care provider if you have any problems or questions after your procedure. HOME CARE INSTRUCTIONS   Keep track of the amount of pain relief you feel and how long it lasts.  Limit pain medicine within the first 4-6 hours after the procedure as directed by your health care provider.  Resume taking dietary supplements and medicines as directed by your health care provider.  You may resume your regular diet.  Do not apply heat near or over the injection site(s) for 24 hours.   Do not take a bath or soak in water (such as a pool or lake) for 24 hours.  Do not drive for 24 hours unless approved by your health care provider.  Avoid strenuous activity for 24 hours.  Remove your bandages the morning after the procedure.   If the injection site is tender, applying an ice pack may relieve some tenderness. To do this:  Put ice in a bag.  Place a towel between your skin and the bag.  Leave the ice on for 15-20 minutes, 3-4 times a day.  Keep follow-up appointments as directed by your health care provider. SEEK MEDICAL CARE IF:   Your pain is not controlled by your medicines.   There is drainage from the injection site.    There is significant bleeding or swelling at the injection site.  You have diabetes and your blood  sugar is above 180 mg/dL. SEEK IMMEDIATE MEDICAL CARE IF:   You develop a fever of 101F (38.3C) or greater.   You have worsening pain or swelling around the injection site.   You have red streaking around the injection site.   You develop severe pain that is not controlled by your medicines.   You develop a headache, stiff neck, nausea, or vomiting.   Your eyes become very sensitive to light.   You have weakness, paralysis, or tingling in your arms or legs that was not present before the procedure.   You develop difficulty urinating or breathing.  This information is not intended to replace advice given to you by your health care provider. Make sure you discuss any questions you have with your health care provider. Document Released: 11/20/2012 Document Revised: 12/25/2014 Document Reviewed: 08/30/2015 Elsevier Interactive Patient Education  2017 [place]  What are the risk, side effects and possible complications? Generally speaking, most procedures are safe.  However, with any procedure there are risks, side effects, and the possibility of complications.  The risks and complications are dependent upon the sites that are lesioned, or the type of nerve block to be performed.  The closer the procedure is to the spine, the more serious the risks are.  Great care is taken when placing the radio frequency needles, block needles or lesioning probes, but sometimes complications can occur. Infection: Any time there is an injection through the skin, there is a risk of infection.  This is why sterile conditions are used for these blocks.  There are four possible types of infection. Localized skin infection. Central Nervous System Infection-This can be in the form of Meningitis, which can be deadly. Epidural Infections-This can be in the form of  an epidural abscess, which can cause pressure inside of the spine, causing compression of the spinal cord with subsequent paralysis. This would require an emergency surgery to decompress, and there are no guarantees that the patient would recover from the paralysis. Discitis-This is an infection of the intervertebral discs.  It occurs in about 1% of discography procedures.  It is difficult to treat and it may lead to surgery.        2. Pain: the needles have to go through skin and soft tissues, will cause soreness.       3. Damage to internal structures:  The nerves to be lesioned may be near blood vessels or    other nerves which can be potentially damaged.       4. Bleeding: Bleeding is more common if the patient is taking blood thinners such as  aspirin, Coumadin, Ticiid, Plavix, etc., or if he/she have some genetic predisposition  such as hemophilia. Bleeding into the spinal canal can cause compression of the spinal  cord with subsequent paralysis.  This would require an emergency surgery to  decompress and there are no guarantees that the patient would recover from the  paralysis.       5. Pneumothorax:  Puncturing of a lung is a possibility, every time a needle is introduced in  the area of the chest or upper back.  Pneumothorax refers to free air around the  collapsed lung(s), inside of the thoracic cavity (chest cavity).  Another two possible  complications related to a similar event would include: Hemothorax and Chylothorax.   These are variations of the Pneumothorax, where instead of air around the collapsed  lung(s), you may have blood or chyle, respectively.  6. Spinal headaches: They may occur with any procedures in the area of the spine.       7. Persistent CSF (Cerebro-Spinal Fluid) leakage: This is a rare problem, but may occur  with prolonged intrathecal or epidural catheters either due to the formation of a fistulous  track or a dural tear.       8. Nerve damage: By working so close  to the spinal cord, there is always a possibility of  nerve damage, which could be as serious as a permanent spinal cord injury with  paralysis.       9. Death:  Although rare, severe deadly allergic reactions known as "Anaphylactic  reaction" can occur to any of the medications used.      10. Worsening of the symptoms:  We can always make thing worse.  What are the chances of something like this happening? Chances of any of this occuring are extremely low.  By statistics, you have more of a chance of getting killed in a motor vehicle accident: while driving to the hospital than any of the above occurring .  Nevertheless, you should be aware that they are possibilities.  In general, it is similar to taking a shower.  Everybody knows that you can slip, hit your head and get killed.  Does that mean that you should not shower again?  Nevertheless always keep in mind that statistics do not mean anything if you happen to be on the wrong side of them.  Even if a procedure has a 1 (one) in a 1,000,000 (million) chance of going wrong, it you happen to be that one..Also, keep in mind that by statistics, you have more of a chance of having something go wrong when taking medications.  Who should not have this procedure? If you are on a blood thinning medication (e.g. Coumadin, Plavix, see list of "Blood Thinners"), or if you have an active infection going on, you should not have the procedure.  If you are taking any blood thinners, please inform your physician.  How should I prepare for this procedure? Do not eat or drink anything at least six hours prior to the procedure. Bring a driver with you .  It cannot be a taxi. Come accompanied by an adult that can drive you back, and that is strong enough to help you if your legs get weak or numb from the local anesthetic. Take all of your medicines the morning of the procedure with just enough water to swallow them. If you have diabetes, make sure that you are  scheduled to have your procedure done first thing in the morning, whenever possible. If you have diabetes, take only half of your insulin dose and notify our nurse that you have done so as soon as you arrive at the clinic. If you are diabetic, but only take blood sugar pills (oral hypoglycemic), then do not take them on the morning of your procedure.  You may take them after you have had the procedure. Do not take aspirin or any aspirin-containing medications, at least eleven (11) days prior to the procedure.  They may prolong bleeding. Wear loose fitting clothing that may be easy to take off and that you would not mind if it got stained with Betadine or blood. Do not wear any jewelry or perfume Remove any nail coloring.  It will interfere with some of our monitoring equipment.  NOTE: Remember that this is not meant to be interpreted as a complete list of all  possible complications.  Unforeseen problems may occur.  BLOOD THINNERS The following drugs contain aspirin or other products, which can cause increased bleeding during surgery and should not be taken for 2 weeks prior to and 1 week after surgery.  If you should need take something for relief of minor pain, you may take acetaminophen which is found in Tylenol,m Datril, Anacin-3 and Panadol. It is not blood thinner. The products listed below are.  Do not take any of the products listed below in addition to any listed on your instruction sheet.  A.P.C or A.P.C with Codeine Codeine Phosphate Capsules #3 Ibuprofen Ridaura  ABC compound Congesprin Imuran rimadil  Advil Cope Indocin Robaxisal  Alka-Seltzer Effervescent Pain Reliever and Antacid Coricidin or Coricidin-D  Indomethacin Rufen  Alka-Seltzer plus Cold Medicine Cosprin Ketoprofen S-A-C Tablets  Anacin Analgesic Tablets or Capsules Coumadin Korlgesic Salflex  Anacin Extra Strength Analgesic tablets or capsules CP-2 Tablets Lanoril Salicylate  Anaprox Cuprimine Capsules Levenox Salocol   Anexsia-D Dalteparin Magan Salsalate  Anodynos Darvon compound Magnesium Salicylate Sine-off  Ansaid Dasin Capsules Magsal Sodium Salicylate  Anturane Depen Capsules Marnal Soma  APF Arthritis pain formula Dewitt's Pills Measurin Stanback  Argesic Dia-Gesic Meclofenamic Sulfinpyrazone  Arthritis Bayer Timed Release Aspirin Diclofenac Meclomen Sulindac  Arthritis pain formula Anacin Dicumarol Medipren Supac  Analgesic (Safety coated) Arthralgen Diffunasal Mefanamic Suprofen  Arthritis Strength Bufferin Dihydrocodeine Mepro Compound Suprol  Arthropan liquid Dopirydamole Methcarbomol with Aspirin Synalgos  ASA tablets/Enseals Disalcid Micrainin Tagament  Ascriptin Doan's Midol Talwin  Ascriptin A/D Dolene Mobidin Tanderil  Ascriptin Extra Strength Dolobid Moblgesic Ticlid  Ascriptin with Codeine Doloprin or Doloprin with Codeine Momentum Tolectin  Asperbuf Duoprin Mono-gesic Trendar  Aspergum Duradyne Motrin or Motrin IB Triminicin  Aspirin plain, buffered or enteric coated Durasal Myochrisine Trigesic  Aspirin Suppositories Easprin Nalfon Trillsate  Aspirin with Codeine Ecotrin Regular or Extra Strength Naprosyn Uracel  Atromid-S Efficin Naproxen Ursinus  Auranofin Capsules Elmiron Neocylate Vanquish  Axotal Emagrin Norgesic Verin  Azathioprine Empirin or Empirin with Codeine Normiflo Vitamin E  Azolid Emprazil Nuprin Voltaren  Bayer Aspirin plain, buffered or children's or timed BC Tablets or powders Encaprin Orgaran Warfarin Sodium  Buff-a-Comp Enoxaparin Orudis Zorpin  Buff-a-Comp with Codeine Equegesic Os-Cal-Gesic   Buffaprin Excedrin plain, buffered or Extra Strength Oxalid   Bufferin Arthritis Strength Feldene Oxphenbutazone   Bufferin plain or Extra Strength Feldene Capsules Oxycodone with Aspirin   Bufferin with Codeine Fenoprofen Fenoprofen Pabalate or Pabalate-SF   Buffets II Flogesic Panagesic   Buffinol plain or Extra Strength Florinal or Florinal with Codeine  Panwarfarin   Buf-Tabs Flurbiprofen Penicillamine   Butalbital Compound Four-way cold tablets Penicillin   Butazolidin Fragmin Pepto-Bismol   Carbenicillin Geminisyn Percodan   Carna Arthritis Reliever Geopen Persantine   Carprofen Gold's salt Persistin   Chloramphenicol Goody's Phenylbutazone   Chloromycetin Haltrain Piroxlcam   Clmetidine heparin Plaquenil   Cllnoril Hyco-pap Ponstel   Clofibrate Hydroxy chloroquine Propoxyphen         Before stopping any of these medications, be sure to consult the physician who ordered them.  Some, such as Coumadin (Warfarin) are ordered to prevent or treat serious conditions such as "deep thrombosis", "pumonary embolisms", and other heart problems.  The amount of time that you may need off of the medication may also vary with the medication and the reason for which you were taking it.  If you are taking any of these medications, please make sure you notify your pain physician before you undergo any procedures.

## 2017-01-22 NOTE — Progress Notes (Signed)
Safety precautions to be maintained throughout the outpatient stay will include: orient to surroundings, keep bed in low position, maintain call bell within reach at all times, provide assistance with transfer out of bed and ambulation.  

## 2017-01-23 ENCOUNTER — Other Ambulatory Visit: Payer: Self-pay | Admitting: Oncology

## 2017-01-23 ENCOUNTER — Telehealth: Payer: Self-pay | Admitting: *Deleted

## 2017-01-23 NOTE — Telephone Encounter (Signed)
No problems post procedure. 

## 2017-01-26 ENCOUNTER — Other Ambulatory Visit: Payer: Self-pay | Admitting: Internal Medicine

## 2017-02-05 ENCOUNTER — Encounter: Payer: Medicare Other | Admitting: Internal Medicine

## 2017-02-20 ENCOUNTER — Ambulatory Visit: Payer: Medicare Other | Attending: Pain Medicine | Admitting: Pain Medicine

## 2017-02-20 ENCOUNTER — Encounter: Payer: Self-pay | Admitting: Pain Medicine

## 2017-02-20 VITALS — BP 129/84 | HR 68 | Temp 98.0°F | Resp 16 | Ht 64.0 in | Wt 180.0 lb

## 2017-02-20 DIAGNOSIS — G894 Chronic pain syndrome: Secondary | ICD-10-CM

## 2017-02-20 DIAGNOSIS — M542 Cervicalgia: Secondary | ICD-10-CM | POA: Diagnosis not present

## 2017-02-20 DIAGNOSIS — M545 Low back pain: Secondary | ICD-10-CM | POA: Insufficient documentation

## 2017-02-20 DIAGNOSIS — Z7982 Long term (current) use of aspirin: Secondary | ICD-10-CM | POA: Insufficient documentation

## 2017-02-20 DIAGNOSIS — M25511 Pain in right shoulder: Secondary | ICD-10-CM | POA: Diagnosis not present

## 2017-02-20 DIAGNOSIS — Z79891 Long term (current) use of opiate analgesic: Secondary | ICD-10-CM | POA: Insufficient documentation

## 2017-02-20 DIAGNOSIS — Z79899 Other long term (current) drug therapy: Secondary | ICD-10-CM | POA: Diagnosis not present

## 2017-02-20 DIAGNOSIS — Z5181 Encounter for therapeutic drug level monitoring: Secondary | ICD-10-CM | POA: Insufficient documentation

## 2017-02-20 DIAGNOSIS — M791 Myalgia: Secondary | ICD-10-CM

## 2017-02-20 DIAGNOSIS — G8929 Other chronic pain: Secondary | ICD-10-CM

## 2017-02-20 DIAGNOSIS — M7918 Myalgia, other site: Secondary | ICD-10-CM

## 2017-02-20 MED ORDER — ROPIVACAINE HCL 5 MG/ML IJ SOLN
INTRAMUSCULAR | Status: AC
  Filled 2017-02-20: qty 20

## 2017-02-20 MED ORDER — LIDOCAINE HCL (PF) 1 % IJ SOLN
INTRAMUSCULAR | Status: AC
  Filled 2017-02-20: qty 5

## 2017-02-20 MED ORDER — LIDOCAINE HCL (PF) 1 % IJ SOLN
5.0000 mL | Freq: Once | INTRAMUSCULAR | Status: AC
  Administered 2017-02-20: 5 mL

## 2017-02-20 MED ORDER — ROPIVACAINE HCL 5 MG/ML IJ SOLN
4.0000 mL | Freq: Once | INTRAMUSCULAR | Status: AC
  Administered 2017-02-20: 20 mL

## 2017-02-20 MED ORDER — TRIAMCINOLONE ACETONIDE 40 MG/ML IJ SUSP
INTRAMUSCULAR | Status: AC
  Filled 2017-02-20: qty 1

## 2017-02-20 MED ORDER — TRIAMCINOLONE ACETONIDE 40 MG/ML IJ SUSP
40.0000 mg | Freq: Once | INTRAMUSCULAR | Status: AC
  Administered 2017-02-20: 40 mg via INTRAMUSCULAR

## 2017-02-20 NOTE — Patient Instructions (Signed)
Post-procedure Information What to expect: Most procedures involve the use of a local anesthetic (numbing medicine), and a steroid (anti-inflammatory medicine).  The local anesthetics may cause temporary numbness and weakness of the legs or arms, depending on the location of the block. This numbness/weakness may last 4-6 hours, depending on the local anesthetic used. In rare instances, it can last up to 24 hours. While numb, you must be very careful not to injure the extremity.  After any procedure, you could expect the pain to get better within 15-20 minutes. This relief is temporary and may last 4-6 hours. Once the local anesthetics wears off, you could experience discomfort, possibly more than usual, for up to 10 (ten) days. In the case of radiofrequencies, it may last up to 6 weeks. Surgeries may take up to 8 weeks for the healing process. The discomfort is due to the irritation caused by needles going through skin and muscle. To minimize the discomfort, we recommend using ice the first day, and heat from then on. The ice should be applied for 15 minutes on, and 15 minutes off. Keep repeating this cycle until bedtime. Avoid applying the ice directly to the skin, to prevent frostbite. Heat should be used daily, until the pain improves (4-10 days). Be careful not to burn yourself.  Occasionally you may experience muscle spasms or cramps. These occur as a consequence of the irritation caused by the needle sticks to the muscle and the blood that will inevitably be lost into the surrounding muscle tissue. Blood tends to be very irritating to tissues, which tend to react by going into spasm. These spasms may start the same day of your procedure, but they may also take days to develop. This late onset type of spasm or cramp is usually caused by electrolyte imbalances triggered by the steroids, at the level of the kidney. Cramps and spasms tend to respond well to muscle relaxants, multivitamins (some are  triggered by the procedure, but may have their origins in vitamin deficiencies), and "Gatorade", or any sports drinks that can replenish any electrolyte imbalances. (If you are a diabetic, ask your pharmacist to get you a sugar-free brand.) Warm showers or baths may also be helpful. Stretching exercises are highly recommended. General Instructions:  Be alert for signs of possible infection: redness, swelling, heat, red streaks, elevated temperature, and/or fever. These typically appear 4 to 6 days after the procedure. Immediately notify your doctor if you experience unusual bleeding, difficulty breathing, or loss of bowel or bladder control. If you experience increased pain, do not increase your pain medicine intake, unless instructed by your pain physician. Post-Procedure Care:  Be careful in moving about. Muscle spasms in the area of the injection may occur. Applying ice or heat to the area is often helpful. The incidence of spinal headaches after epidural injections ranges between 1.4% and 6%. If you develop a headache that does not seem to respond to conservative therapy, please let your physician know. This can be treated with an epidural blood patch.   Post-procedure numbness or redness is to be expected, however it should average 4 to 6 hours. If numbness and weakness of your extremities begins to develop 4 to 6 hours after your procedure, and is felt to be progressing and worsening, immediately contact your physician.   Diet:  If you experience nausea, do not eat until this sensation goes away. If you had a "Stellate Ganglion Block" for upper extremity "Reflex Sympathetic Dystrophy", do not eat or drink until your   hoarseness goes away. In any case, always start with liquids first and if you tolerate them well, then slowly progress to more solid foods. Activity:  For the first 4 to 6 hours after the procedure, use caution in moving about as you may experience numbness and/or weakness. Use caution in  cooking, using household electrical appliances, and climbing steps. If you need to reach your Doctor call our office: [phone] Monday-Thursday 8:00 am - 4:00 PM    Fridays: Closed     In case of an emergency: In case of emergency, call 911 or go to the nearest emergency room and have the physician there call us.  Interpretation of Procedure Every nerve block has two components: a diagnostic component, and a treatment component. Unrealistic expectations are the most common causes of "perceived failure".  In a perfect world, a single nerve block should be able to completely and permanently eliminate the pain. Sadly, the world is not perfect.  Most pain management nerve blocks are performed using local anesthetics and steroids. Steroids are responsible for any long-term benefit that you may experience. Their purpose is to decrease any chronic swelling that may exist in the area. Steroids begin to work immediately after being injected. However, most patients will not experience any benefits until 5 to 10 days after the injection, when the swelling has come down to the point where they can tell a difference. Steroids will only help if there is swelling to be treated. As such, they can assist with the diagnosis. If effective, they suggest an inflammatory component to the pain, and if ineffective, they rule out inflammation as the main cause or component of the problem. If the problem is one of mechanical compression, you will get no benefit from those steroids.   In the case of local anesthetics, they have a crucial role in the diagnosis of your condition. Most will begin to work within15 to 20 minutes after injection. The duration will depend on the type used (short- vs. Long-acting). It is of outmost importance that patients keep tract of their pain, after the procedure. To assist with this matter, a "Post-procedure Pain Diary" is provided. Make sure to complete it and to bring it back to your  follow-up appointment.  As long as the patient keeps accurate, detailed records of their symptoms after every procedure, and returns to have those interpreted, every procedure will provide us with invaluable information. Even a block that does not provide the patient with any relief, will always provide us with information about the mechanism and the origin of the pain. The only time a nerve block can be considered a waste of time is when patients do not keep track of the results, or do not keep their post-procedure appointment.  Reporting the results back to your physician The Pain Score  Pain is a subjective complaint. It cannot be seen, touched, or measured. We depend entirely on the patient's report of the pain in order to assess your condition and treatment. To evaluate the pain, we use a pain scale, where "0" means "No Pain", and a "10" is "the worst possible pain that you can even imagine" (i.e. something like been eaten alive by a shark or being torn apart by a lion).   You will frequently be asked to rate your pain. Please be as accurate, remember that medical decisions will be based on your responses. Please do not rate your pain above a 10. Doing so is actually interpreted as "symptom magnification" (exaggeration), as   well as lack of understanding with regards to the scale. To put this into perspective, when you tell us that your pain is at a 10 (ten), what you are saying is that there is nothing we can do to make this pain any worse. (Carefully think about that.) 

## 2017-02-20 NOTE — Progress Notes (Signed)
Patient's Name: Wanda Martinez  MRN: [id number]  Referring Provider: Einar Pheasant, MD  DOB: 1935-08-03  PCP: Einar Pheasant, MD  DOS: 02/20/2017  Note by: Kathlen Brunswick. Dossie Arbour, MD  Service setting: Ambulatory outpatient  Specialty: Interventional Pain Management  Location: [facility] (AMB) Pain Management Facility    Patient type: Established   Primary Reason(s) for Visit: Encounter for post-procedure evaluation of chronic illness with mild to moderate exacerbation CC: Back Pain (low); Hip Pain; and Shoulder Pain (right)  HPI  Wanda Martinez is a 82 y.o. year old, female patient, who comes today for a post-procedure evaluation. She has Hypercholesterolemia; Essential hypertension; DYSPNEA; CHEST PAIN; Frequent UTI; Cystocele; Primary cancer of upper inner quadrant of right female breast ([place]); Anemia; Tremor; Toenail fungus; Skin lesions; Headache; Obesity (BMI 30-39.9); Dysuria; Sinusitis; Health care maintenance; Parkinsons ([place]); Fatigue; Chronic kidney disease (CKD), stage II (mild); Cystocele, midline; Clinical depression; Incomplete bladder emptying; Mixed incontinence; HLD (hyperlipidemia); Bladder infection, chronic; Parkinson's disease ([place]); Pure hypercholesterolemia; Hernia, rectovaginal; Long term current use of opiate analgesic; Long term prescription opiate use; Opiate use; Encounter for therapeutic drug level monitoring; Encounter for chronic pain management; Chronic low back pain (Location of Primary Source of Pain) (Bilateral) (R>L); Lumbar spondylosis; Chronic hip pain; Chronic neck pain; Cervical spondylosis; Chronic cervical radicular pain (Right); Chronic upper extremity pain (Right); Osteoarthritis; Diffuse myofascial pain syndrome; Neurogenic pain; Chronic upper back pain (Right); Myofascial pain syndrome (Right) (cervicothoracic); Vaginitis; Lumbar facet syndrome (Location of Primary Source of Pain) (Bilateral) (R>L); Malignant neoplasm of right female breast ([place]); Chronic  constipation; Cervical facet hypertrophy; Cervical facet syndrome; Chronic shoulder pain (Right); and Chronic pain syndrome on her problem list. Her primarily concern today is the Back Pain (low); Hip Pain; and Shoulder Pain (right)  Pain Assessment: Self-Reported Pain Score: 3 /10             Reported level is compatible with observation.       Pain Type: Chronic pain Pain Location: Back Pain Orientation: Lower, Right Pain Descriptors / Indicators: Aching, Constant, Nagging, Spasm Pain Frequency: Constant  Wanda Martinez comes in today for post-procedure evaluation after the treatment done on 01/22/2017.  Further details on both, my assessment(s), as well as the proposed treatment plan, please see below.  Procedure:  Anesthesia, Analgesia, Anxiolysis:  Type: Trigger Point Injection Level: Cervical thoracic region at the level of the Laterality: Right-sided Position: Sitting Target Muscle: Suprascapular muscle Approach: Posterior Region: Posterior  Cervicothoracic area. Primary Purpose: Palliative  Type: Local Anesthesia Local Anesthetic: Lidocaine 1% Route: Infiltration ([place]/IM) IV Access: Declined Sedation: Declined  Indication(s): Analgesia          Indications: 1. Myofascial pain syndrome (Right) (cervicothoracic)   2. Chronic right shoulder pain    Pain Score: Pre-procedure: 3 /10 Post-procedure: 0-No pain/10  Pre-op Assessment:  Previous date of service: 01/22/17 Service provided: Procedure (blf) Wanda Martinez is a 82 y.o. (year old), female patient, seen today for interventional treatment. She  has a past surgical history that includes Breast biopsy (2013); Tonsillectomy and adenoidectomy (79); Vesicovaginal fistula closure w/ TAH (1983); Breast surgery (1986); Breast enhancement surgery (1987); Breast implant removal; Breast implant removal (Right, 08/29/2012); Mastectomy (08/2012); Abdominal hysterectomy; and Colonoscopy with propofol (N/A, 09/13/2016). Her primarily concern today  is the Back Pain (low); Hip Pain; and Shoulder Pain (right)  Initial Vital Signs: Blood pressure (!) 143/72, pulse 65, temperature 98 F (36.7 C), resp. rate 18, height 5' 4" (1.626 m), weight 180 lb (81.6 kg), last menstrual period 12/18/1981.  BMI: 30.90 kg/m  Risk Assessment: Allergies: Reviewed. She is allergic to vesicare [solifenacin].  Allergy Precautions: None required Coagulopathies: "Reviewed. None identified.  Blood-thinner therapy: None at this time Active Infection(s): Reviewed. None identified. Wanda Martinez is afebrile  Site Confirmation: Wanda Martinez was asked to confirm the procedure and laterality before marking the site Procedure checklist: Completed Consent: Before the procedure and under the influence of no sedative(s), amnesic(s), or anxiolytics, the patient was informed of the treatment options, risks and possible complications. To fulfill our ethical and legal obligations, as recommended by the American Medical Association's Code of Ethics, I have informed the patient of my clinical impression; the nature and purpose of the treatment or procedure; the risks, benefits, and possible complications of the intervention; the alternatives, including doing nothing; the risk(s) and benefit(s) of the alternative treatment(s) or procedure(s); and the risk(s) and benefit(s) of doing nothing. The patient was provided information about the general risks and possible complications associated with the procedure. These may include, but are not limited to: failure to achieve desired goals, infection, bleeding, organ or nerve damage, allergic reactions, paralysis, and death. In addition, the patient was informed of those risks and complications associated to the procedure, such as failure to decrease pain; infection; bleeding; organ or nerve damage with subsequent damage to sensory, motor, and/or autonomic systems, resulting in permanent pain, numbness, and/or weakness of one or several areas of the  body; allergic reactions; (i.e.: anaphylactic reaction); and/or death. Furthermore, the patient was informed of those risks and complications associated with the medications. These include, but are not limited to: allergic reactions (i.e.: anaphylactic or anaphylactoid reaction(s)); adrenal axis suppression; blood sugar elevation that in diabetics may result in ketoacidosis or comma; water retention that in patients with history of congestive heart failure may result in shortness of breath, pulmonary edema, and decompensation with resultant heart failure; weight gain; swelling or edema; medication-induced neural toxicity; particulate matter embolism and blood vessel occlusion with resultant organ, and/or nervous system infarction; and/or aseptic necrosis of one or more joints. Finally, the patient was informed that Medicine is not an exact science; therefore, there is also the possibility of unforeseen or unpredictable risks and/or possible complications that may result in a catastrophic outcome. The patient indicated having understood very clearly. We have given the patient no guarantees and we have made no promises. Enough time was given to the patient to ask questions, all of which were answered to the patient's satisfaction. Wanda Martinez has indicated that she wanted to continue with the procedure. Attestation: I, the ordering provider, attest that I have discussed with the patient the benefits, risks, side-effects, alternatives, likelihood of achieving goals, and potential problems during recovery for the procedure that I have provided informed consent. Date: 02/20/2017; Time: 2:23 PM  Pre-Procedure Preparation:  Monitoring: As per clinic protocol. Respiration, ETCO2, SpO2, BP, heart rate and rhythm monitor placed and checked for adequate function Safety Precautions: Patient was assessed for positional comfort and pressure points before starting the procedure. Time-out: I initiated and conducted the  "Time-out" before starting the procedure, as per protocol. The patient was asked to participate by confirming the accuracy of the "Time Out" information. Verification of the correct person, site, and procedure were performed and confirmed by me, the nursing staff, and the patient. "Time-out" conducted as per Joint Commission's Universal Protocol (UP.01.01.01). "Time-out" Date & Time: 02/20/2017; 1424 hrs.  Description of Procedure Process:   Prepping solution: ChloraPrep (2% chlorhexidine gluconate and 70% isopropyl alcohol) Safety Precautions: Aspiration looking  for blood return was conducted prior to all injections. At no point did we inject any substances, as a needle was being advanced. No attempts were made at seeking any paresthesias. Safe injection practices and needle disposal techniques used. Medications properly checked for expiration dates. SDV (single dose vial) medications used. Description of the Procedure: Protocol guidelines were followed. The patient was placed in position over the fluoroscopy table. The target area was identified and the area prepped in the usual manner. Skin desensitized using vapocoolant spray. Skin & deeper tissues infiltrated with local anesthetic. Appropriate amount of time allowed to pass for local anesthetics to take effect. The procedure needles were then advanced to the target area. Proper needle placement secured. Negative aspiration confirmed. Solution injected in intermittent fashion, asking for systemic symptoms every 0.5cc of injectate. The needles were then removed and the area cleansed, making sure to leave some of the prepping solution back to take advantage of its long term bactericidal properties. Vitals:   02/20/17 1316 02/20/17 1317 02/20/17 1423 02/20/17 1427  BP:  (!) 143/72 135/78 129/84  Pulse: 65  64 68  Resp: _0 Temp: 98 F (36.7 C)     SpO2:   99% 100%  Weight: 180 lb (81.6 kg)     Height: 5' 4" (1.626 m)       Start Time: 1424  hrs. End Time: 1426 hrs. Materials:  Needle(s) Type: Regular needle Gauge: 25G Length: 1.5-in Medication(s): We administered triamcinolone acetonide, ropivacaine (PF) 5 mg/mL (0.5%), and lidocaine (PF). Please see chart orders for dosing details.  Post-Procedure Assessment  01/22/2017 Procedure: Palliative bilateral lumbar facet block under fluoroscopic guidance and IV sedation Post-procedure pain score: 0/10 (100% relief) Influential Factors: BMI: 30.90 kg/m Intra-procedural challenges: None observed Assessment challenges: None detected         Post-procedural side-effects, adverse reactions, or complications: None reported Reported issues: None  Sedation: Sedation provided. When no sedatives are used, the analgesic levels obtained are directly associated to the effectiveness of the local anesthetics. However, when sedation is provided, the level of analgesia obtained during the initial 1 hour following the intervention, is believed to be the result of a combination of factors. These factors may include, but are not limited to: 1. The effectiveness of the local anesthetics used. 2. The effects of the analgesic(s) and/or anxiolytic(s) used. 3. The degree of discomfort experienced by the patient at the time of the procedure. 4. The patients ability and reliability in recalling and recording the events. 5. The presence and influence of possible secondary gains and/or psychosocial factors. Reported result: Relief experienced during the 1st hour after the procedure: 100 % (Ultra-Short Term Relief) Interpretative annotation: Analgesia during this period is likely to be Local Anesthetic and/or IV Sedative (Analgesic/Anxiolitic) related.          Effects of local anesthetic: The analgesic effects attained during this period are directly associated to the localized infiltration of local anesthetics and therefore cary significant diagnostic value as to the etiological location, or anatomical origin,  of the pain. Expected duration of relief is directly dependent on the pharmacodynamics of the local anesthetic used. Long-acting (4-6 hours) anesthetics used.  Reported result: Relief during the next 4 to 6 hour after the procedure: 75 % (Short-Term Relief) Interpretative annotation: Complete relief would suggest area to be the source of the pain.          Long-term benefit: Defined as the period of time past the expected duration of local anesthetics.  With the possible exception of prolonged sympathetic blockade from the local anesthetics, benefits during this period are typically attributed to, or associated with, other factors such as analgesic sensory neuropraxia, antiinflammatory effects, or beneficial biochemical changes provided by agents other than the local anesthetics Reported result: Extended relief following procedure: 50 % (ongoing) (Long-Term Relief) Interpretative annotation: Good relief. This could suggest inflammation to be a significant component in the etiology to the pain.          Current benefits: Defined as persistent relief that continues at this point in time.   Reported results: Treated area: 50 % Wanda Martinez reports improvement in function Interpretative annotation: Ongoing benefits would suggest effective therapeutic approach  Interpretation: Results would suggest a successful diagnostic intervention.          Laboratory Chemistry  Inflammation Markers No results found for: CRP, ESRSEDRATE (CRP: Acute Phase) (ESR: Chronic Phase) Renal Function Markers Lab Results  Component Value Date   BUN 17 10/18/2016   CREATININE 0.98 10/18/2016   GFRAA >60 10/08/2014   GFRNONAA >60 10/08/2014   Hepatic Function Markers Lab Results  Component Value Date   AST 22 10/18/2016   ALT 12 10/18/2016   ALBUMIN 4.3 10/18/2016   ALKPHOS 71 10/18/2016   Electrolytes Lab Results  Component Value Date   NA 137 10/18/2016   K 4.6 10/18/2016   CL 98 10/18/2016   CALCIUM 9.7  10/18/2016   MG 1.8 04/21/2013   Neuropathy Markers Lab Results  Component Value Date   WCBJSEGB15 176 02/01/2016   Bone Pathology Markers Lab Results  Component Value Date   ALKPHOS 71 10/18/2016   CALCIUM 9.7 10/18/2016   Coagulation Parameters Lab Results  Component Value Date   PLT 244.0 10/18/2016   Cardiovascular Markers Lab Results  Component Value Date   HGB 12.3 10/18/2016   HCT 36.6 10/18/2016   Note: Lab results reviewed.  Recent Diagnostic Imaging Review  Dg C-arm 1-60 Min-no Report  Result Date: 01/22/2017 Fluoroscopy was utilized by the requesting physician.  No radiographic interpretation.   Note: Imaging results reviewed.          Meds  The patient has a current medication list which includes the following prescription(s): amlodipine, aspirin, calcium carbonate, carbidopa-levodopa, citalopram, cyclobenzaprine, gabapentin, glucosamine sulfate, hydrochlorothiazide, hydrocodone-acetaminophen, hydrocodone-acetaminophen, hydrocodone-acetaminophen, letrozole, losartan, losartan, lubiprostone, magnesium oxide, meloxicam, metoprolol succinate, multivitamin, omega-3, omeprazole, and pravastatin.  Current Outpatient Prescriptions on File Prior to Visit  Medication Sig  . amLODipine (NORVASC) 2.5 MG tablet TAKE 1 TABLET BY MOUTH ONCE DAILY.  Marland Kitchen aspirin 81 MG EC tablet Take 81 mg by mouth daily as needed.    . Calcium Carbonate (CALCIUM 600) 1500 MG TABS Take 1 tablet by mouth daily.    . cyclobenzaprine (FLEXERIL) 10 MG tablet Take 1 tablet (10 mg total) by mouth at bedtime.  . gabapentin (NEURONTIN) 300 MG capsule Take 1 capsule (300 mg total) by mouth 2 (two) times daily.  . hydrochlorothiazide (HYDRODIURIL) 25 MG tablet TAKE 1 TABLET BY MOUTH ONCE DAILY.  Derrill Memo ON 02/28/2017] HYDROcodone-acetaminophen (NORCO/VICODIN) 5-325 MG tablet Take 1 tablet by mouth every 8 (eight) hours as needed for severe pain.  Derrill Memo ON 03/30/2017] HYDROcodone-acetaminophen  (NORCO/VICODIN) 5-325 MG tablet Take 1 tablet by mouth every 8 (eight) hours as needed for severe pain.  Marland Kitchen HYDROcodone-acetaminophen (NORCO/VICODIN) 5-325 MG tablet Take 1 tablet by mouth every 8 (eight) hours as needed for severe pain.  Marland Kitchen letrozole (FEMARA) 2.5 MG tablet TAKE 1 TABLET BY  MOUTH ONCE DAILY.  Marland Kitchen losartan (COZAAR) 100 MG tablet TAKE 1 TABLET BY MOUTH ONCE DAILY.  Marland Kitchen lubiprostone (AMITIZA) 24 MCG capsule TAKE 1 CAPSULE BY MOUTH ONCE DAILY WITH BREAKFAST.  . magnesium oxide (MAGNESIUM-OXIDE) 400 (241.3 Mg) MG tablet Take 1 tablet (400 mg total) by mouth daily.  . meloxicam (MOBIC) 7.5 MG tablet Take 1 tablet (7.5 mg total) by mouth daily.  . metoprolol succinate (TOPROL-XL) 25 MG 24 hr tablet TAKE 1 TABLET BY MOUTH TWICE DAILY  . Multiple Vitamin (MULTIVITAMIN) tablet Take 1 tablet by mouth daily.  Marland Kitchen omeprazole (PRILOSEC) 20 MG capsule TAKE 1 CAPSULE BY MOUTH TWICE DAILY FOR REFLUX.  . pravastatin (PRAVACHOL) 20 MG tablet TAKE 1 TABLET BY MOUTH ONCE DAILY.   No current facility-administered medications on file prior to visit.    ROS  Constitutional: Denies any fever or chills Gastrointestinal: No reported hemesis, hematochezia, vomiting, or acute GI distress Musculoskeletal: Denies any acute onset joint swelling, redness, loss of ROM, or weakness Neurological: No reported episodes of acute onset apraxia, aphasia, dysarthria, agnosia, amnesia, paralysis, loss of coordination, or loss of consciousness  Allergies  Wanda Martinez is allergic to vesicare [solifenacin].  [place]  Drug: Wanda Martinez  reports that she does not use drugs. Alcohol:  reports that she does not drink alcohol. Tobacco:  reports that she has never smoked. She has never used smokeless tobacco. Medical:  has a past medical history of Anemia; Arm pain (07/26/2015); Arthritis; Arthritis, degenerative (03/26/2014); Back pain (11/01/2013); Breast cancer ([place]); Breast cancer ([place]); Chronic cystitis; Degeneration of intervertebral  disc of lumbosacral region (03/26/2014); Enthesopathy of hip (03/26/2014); GERD (gastroesophageal reflux disease); Hiatal hernia; HTN (hypertension); LBP (low back pain) (03/26/2014); Neck pain (11/01/2013); Parkinson disease ([place]); and Urinary incontinence. Family: family history includes Colon polyps in her father; Diabetes in her father; Parkinson's disease in her mother; Stroke in her father and mother.  Past Surgical History:  Procedure Laterality Date  . ABDOMINAL HYSTERECTOMY    . BREAST BIOPSY  2013  . BREAST ENHANCEMENT SURGERY  1987  . BREAST IMPLANT REMOVAL    . BREAST IMPLANT REMOVAL Right 08/29/2012  . BREAST SURGERY  1986   s/p left mastectomy  . COLONOSCOPY WITH PROPOFOL N/A 09/13/2016   Procedure: COLONOSCOPY WITH PROPOFOL;  Surgeon: Manya Silvas, MD;  Location: [facility] ENDOSCOPY;  Service: Endoscopy;  Laterality: N/A;  . MASTECTOMY  08/2012   right  . TONSILLECTOMY AND ADENOIDECTOMY  79  . VESICOVAGINAL FISTULA CLOSURE W/ TAH  1983   Constitutional Exam  General appearance: Well nourished, well developed, and well hydrated. In no apparent acute distress Vitals:   02/20/17 1316 02/20/17 1317 02/20/17 1423 02/20/17 1427  BP:  (!) 143/72 135/78 129/84  Pulse: 65  64 68  Resp: _0 Temp: 98 F (36.7 C)     SpO2:   99% 100%  Weight: 180 lb (81.6 kg)     Height: 5' 4" (1.626 m)      BMI Assessment: Estimated body mass index is 30.9 kg/m as calculated from the following:   Height as of this encounter: 5' 4" (1.626 m).   Weight as of this encounter: 180 lb (81.6 kg).  BMI interpretation table: BMI level Category Range association with higher incidence of chronic pain  <18 kg/m2 Underweight   18.5-24.9 kg/m2 Ideal body weight   25-29.9 kg/m2 Overweight Increased incidence by 20%  30-34.9 kg/m2 Obese (Class I) Increased incidence by 68%  35-39.9 kg/m2 Severe obesity (  Class II) Increased incidence by 136%  >40 kg/m2 Extreme obesity (Class III) Increased incidence by  254%   BMI Readings from Last 4 Encounters:  02/20/17 30.90 kg/m  01/22/17 30.73 kg/m  01/16/17 30.90 kg/m  10/26/16 32.79 kg/m   Wt Readings from Last 4 Encounters:  02/20/17 180 lb (81.6 kg)  01/22/17 179 lb (81.2 kg)  01/16/17 180 lb (81.6 kg)  10/26/16 185 lb 1.9 oz (84 kg)  Psych/Mental status: Alert, oriented x 3 (person, place, & time)       Eyes: PERLA Respiratory: No evidence of acute respiratory distress  Cervical Spine Exam  Inspection: No masses, redness, or swelling Alignment: Symmetrical Functional ROM: Guarding Stability: No instability detected Muscle strength & Tone: Functionally intact Sensory: Unimpaired Palpation: Non-contributory  Upper Extremity (UE) Exam    Side: Right upper extremity  Side: Left upper extremity  Inspection: Normal skin color, temperature, and hair growth. No peripheral edema or cyanosis  Inspection: No masses, redness, swelling, or asymmetry. No contractures  Functional ROM: Guarding for shoulder  Functional ROM: Unrestricted ROM          Muscle strength & Tone: Guarding  Muscle strength & Tone: Functionally intact  Sensory: Musculoskeletal pain pattern  Sensory: Unimpaired  Palpation: Euthermic  Palpation: Euthermic  Specialized Test(s): Deferred         Specialized Test(s): Deferred          Thoracic Spine Exam  Inspection: No masses, redness, or swelling Alignment: Symmetrical Functional ROM: Unrestricted ROM Stability: No instability detected Sensory: Unimpaired Muscle strength & Tone: Functionally intact Palpation: Non-contributory  Lumbar Spine Exam  Inspection: No masses, redness, or swelling Alignment: Symmetrical Functional ROM: Unrestricted ROM Stability: No instability detected Muscle strength & Tone: Functionally intact Sensory: Unimpaired Palpation: Non-contributory Provocative Tests: Lumbar Hyperextension and rotation test: evaluation deferred today       Patrick's Maneuver: evaluation deferred today               Gait & Posture Assessment  Ambulation: Unassisted Gait: Relatively normal for age and body habitus Posture: WNL   Lower Extremity Exam    Side: Right lower extremity  Side: Left lower extremity  Inspection: No masses, redness, swelling, or asymmetry. No contractures  Inspection: No masses, redness, swelling, or asymmetry. No contractures  Functional ROM: Unrestricted ROM          Functional ROM: Unrestricted ROM          Muscle strength & Tone: Functionally intact  Muscle strength & Tone: Functionally intact  Sensory: Unimpaired  Sensory: Unimpaired  Palpation: No palpable anomalies  Palpation: No palpable anomalies   Assessment  Primary Diagnosis & Pertinent Problem List: The primary encounter diagnosis was Myofascial pain syndrome (Right) (cervicothoracic). A diagnosis of Chronic right shoulder pain was also pertinent to this visit.  Status Diagnosis  Controlled Controlled Controlled 1. Myofascial pain syndrome (Right) (cervicothoracic)   2. Chronic right shoulder pain      Plan of Care  Pharmacotherapy (Medications Ordered): Meds ordered this encounter  Medications  . triamcinolone acetonide (KENALOG-40) injection 40 mg  . ropivacaine (PF) 5 mg/mL (0.5%) (NAROPIN) injection 4 mL  . lidocaine (PF) (XYLOCAINE) 1 % injection 5 mL   New Prescriptions   No medications on file   Medications administered today: We administered triamcinolone acetonide, ropivacaine (PF) 5 mg/mL (0.5%), and lidocaine (PF). Lab-work, procedure(s), and/or referral(s): Orders Placed This Encounter  Procedures  . TRIGGER POINT INJECTION  . Discharge instructions  . Follow-up  .  Informed Consent Details: Transcribe to consent form and obtain patient signature  . Provider attestation of informed consent for procedure/surgical case  . Verify informed consent   Imaging and/or referral(s): DISCHARGE INSTRUCTIONS FOLLOW UP INFORMED CONSENT DETAILS: TRANSCRIBE TO CONSENT FORM AND OBTAIN  PATIENT SIGNATURE PROVIDER ATTESTATION OF INFORMED CONSENT PROCEDURE/SURGICAL CASE VERIFY INFORMED CONSENT  Interventional therapies: Planned, scheduled, and/or pending:   Palliative Right suprascapular muscle trigger point injection today (02/20/17)   Considering:   Diagnostic right-sided lumbar facet block  Palliative right-sided lumbar facet radiofrequency ablation  Palliative Right suprascapular muscle trigger point injection  Diagnostic bilateral cervical facet block  Possible bilateral cervical facet radiofrequency ablation  Diagnostic right-sided cervical epidural steroid injection  Diagnostic intra-articular hip joint injection  Possible hip joint radiofrequency ablation    Palliative PRN treatment(s):   Diagnostic right-sided lumbar facet block  Palliative right-sided lumbar facet radiofrequency ablation  Palliative Right suprascapular muscle trigger point injection  Diagnostic bilateral cervical facet block  Diagnostic right-sided cervical epidural steroid injection  Diagnostic intra-articular hip joint injection    Provider-requested follow-up: Return for keep scheduled Med-Mgmt appointment, in addition, Procedure: Trigger point injection today.  Future Appointments Date Time Provider [place]  03/27/2017 10:45 AM Lloyd Huger, MD [facility] None  04/19/2017 1:30 PM Einar Pheasant, MD [facility] None  05/02/2017 11:00 AM Noreene Filbert, MD [facility] None  10/26/2017 1:00 PM Denisa L O'Brien-Blaney, LPN [facility] None   Primary Care Physician: Einar Pheasant, MD Location: [facility] Note by: Kathlen Brunswick. Dossie Arbour, M.D, DABA, DABAPM, DABPM, DABIPP, FIPP Date: 02/20/2017; Time: 2:58 PM  Pain Score Disclaimer: We use the NRS-11 scale. This is a self-reported, subjective measurement of pain severity with only modest accuracy. It is used primarily to identify changes within a particular patient. It must be understood that outpatient  pain scales are significantly less accurate that those used for research, where they can be applied under ideal controlled circumstances with minimal exposure to variables. In reality, the score is likely to be a combination of pain intensity and pain affect, where pain affect describes the degree of emotional arousal or changes in action readiness caused by the sensory experience of pain. Factors such as social and work situation, setting, emotional state, anxiety levels, expectation, and prior pain experience may influence pain perception and show large inter-individual differences that may also be affected by time variables.  Patient instructions provided during this appointment: Patient Instructions  Post-procedure Information What to expect: Most procedures involve the use of a local anesthetic (numbing medicine), and a steroid (anti-inflammatory medicine).  The local anesthetics may cause temporary numbness and weakness of the legs or arms, depending on the location of the block. This numbness/weakness may last 4-6 hours, depending on the local anesthetic used. In rare instances, it can last up to 24 hours. While numb, you must be very careful not to injure the extremity.  After any procedure, you could expect the pain to get better within 15-20 minutes. This relief is temporary and may last 4-6 hours. Once the local anesthetics wears off, you could experience discomfort, possibly more than usual, for up to 10 (ten) days. In the case of radiofrequencies, it may last up to 6 weeks. Surgeries may take up to 8 weeks for the healing process. The discomfort is due to the irritation caused by needles going through skin and muscle. To minimize the discomfort, we recommend using ice the first day, and heat from then on. The ice should be applied for  15 minutes on, and 15 minutes off. Keep repeating this cycle until bedtime. Avoid applying the ice directly to the skin, to prevent frostbite. Heat should be  used daily, until the pain improves (4-10 days). Be careful not to burn yourself.  Occasionally you may experience muscle spasms or cramps. These occur as a consequence of the irritation caused by the needle sticks to the muscle and the blood that will inevitably be lost into the surrounding muscle tissue. Blood tends to be very irritating to tissues, which tend to react by going into spasm. These spasms may start the same day of your procedure, but they may also take days to develop. This late onset type of spasm or cramp is usually caused by electrolyte imbalances triggered by the steroids, at the level of the kidney. Cramps and spasms tend to respond well to muscle relaxants, multivitamins (some are triggered by the procedure, but may have their origins in vitamin deficiencies), and "Gatorade", or any sports drinks that can replenish any electrolyte imbalances. (If you are a diabetic, ask your pharmacist to get you a sugar-free brand.) Warm showers or baths may also be helpful. Stretching exercises are highly recommended. General Instructions:  Be alert for signs of possible infection: redness, swelling, heat, red streaks, elevated temperature, and/or fever. These typically appear 4 to 6 days after the procedure. Immediately notify your doctor if you experience unusual bleeding, difficulty breathing, or loss of bowel or bladder control. If you experience increased pain, do not increase your pain medicine intake, unless instructed by your pain physician. Post-Procedure Care:  Be careful in moving about. Muscle spasms in the area of the injection may occur. Applying ice or heat to the area is often helpful. The incidence of spinal headaches after epidural injections ranges between 1.4% and 6%. If you develop a headache that does not seem to respond to conservative therapy, please let your physician know. This can be treated with an epidural blood patch.   Post-procedure numbness or redness is to be  expected, however it should average 4 to 6 hours. If numbness and weakness of your extremities begins to develop 4 to 6 hours after your procedure, and is felt to be progressing and worsening, immediately contact your physician.   Diet:  If you experience nausea, do not eat until this sensation goes away. If you had a "Stellate Ganglion Block" for upper extremity "Reflex Sympathetic Dystrophy", do not eat or drink until your hoarseness goes away. In any case, always start with liquids first and if you tolerate them well, then slowly progress to more solid foods. Activity:  For the first 4 to 6 hours after the procedure, use caution in moving about as you may experience numbness and/or weakness. Use caution in cooking, using household electrical appliances, and climbing steps. If you need to reach your Doctor call our office: [phone] Monday-Thursday 8:00 am - 4:00 PM    Fridays: Closed     In case of an emergency: In case of emergency, call 911 or go to the nearest emergency room and have the physician there call us.  Interpretation of Procedure Every nerve block has two components: a diagnostic component, and a treatment component. Unrealistic expectations are the most common causes of "perceived failure".  In a perfect world, a single nerve block should be able to completely and permanently eliminate the pain. Sadly, the world is not perfect.  Most pain management nerve blocks are performed using local anesthetics and steroids. Steroids are responsible for  any long-term benefit that you may experience. Their purpose is to decrease any chronic swelling that may exist in the area. Steroids begin to work immediately after being injected. However, most patients will not experience any benefits until 5 to 10 days after the injection, when the swelling has come down to the point where they can tell a difference. Steroids will only help if there is swelling to be treated. As such, they can assist with  the diagnosis. If effective, they suggest an inflammatory component to the pain, and if ineffective, they rule out inflammation as the main cause or component of the problem. If the problem is one of mechanical compression, you will get no benefit from those steroids.   In the case of local anesthetics, they have a crucial role in the diagnosis of your condition. Most will begin to work within15 to 20 minutes after injection. The duration will depend on the type used (short- vs. Long-acting). It is of outmost importance that patients keep tract of their pain, after the procedure. To assist with this matter, a "Post-procedure Pain Diary" is provided. Make sure to complete it and to bring it back to your follow-up appointment.  As long as the patient keeps accurate, detailed records of their symptoms after every procedure, and returns to have those interpreted, every procedure will provide [place] with invaluable information. Even a block that does not provide the patient with any relief, will always provide [place] with information about the mechanism and the origin of the pain. The only time a nerve block can be considered a waste of time is when patients do not keep track of the results, or do not keep their post-procedure appointment.  Reporting the results back to your physician The Pain Score  Pain is a subjective complaint. It cannot be seen, touched, or measured. We depend entirely on the patient's report of the pain in order to assess your condition and treatment. To evaluate the pain, we use a pain scale, where "0" means "No Pain", and a "10" is "the worst possible pain that you can even imagine" (i.e. something like been eaten alive by a shark or being torn apart by a lion).   You will frequently be asked to rate your pain. Please be as accurate, remember that medical decisions will be based on your responses. Please do not rate your pain above a 10. Doing so is actually interpreted as "symptom  magnification" (exaggeration), as well as lack of understanding with regards to the scale. To put this into perspective, when you tell us that your pain is at a 10 (ten), what you are saying is that there is nothing we can do to make this pain any worse. (Carefully think about that.)

## 2017-02-28 ENCOUNTER — Other Ambulatory Visit: Payer: Self-pay | Admitting: Internal Medicine

## 2017-03-13 ENCOUNTER — Ambulatory Visit: Payer: Medicare Other | Admitting: Pain Medicine

## 2017-03-27 ENCOUNTER — Other Ambulatory Visit: Payer: Self-pay | Admitting: Internal Medicine

## 2017-03-27 ENCOUNTER — Ambulatory Visit: Payer: Medicare Other | Admitting: Oncology

## 2017-03-29 ENCOUNTER — Other Ambulatory Visit: Payer: Self-pay | Admitting: Internal Medicine

## 2017-03-29 ENCOUNTER — Other Ambulatory Visit: Payer: Self-pay | Admitting: Pain Medicine

## 2017-03-29 DIAGNOSIS — M150 Primary generalized (osteo)arthritis: Principal | ICD-10-CM

## 2017-03-29 DIAGNOSIS — M159 Polyosteoarthritis, unspecified: Secondary | ICD-10-CM

## 2017-03-29 DIAGNOSIS — M792 Neuralgia and neuritis, unspecified: Secondary | ICD-10-CM

## 2017-03-29 DIAGNOSIS — M7918 Myalgia, other site: Secondary | ICD-10-CM

## 2017-04-03 ENCOUNTER — Ambulatory Visit: Payer: Medicare Other | Attending: Nurse Practitioner | Admitting: Nurse Practitioner

## 2017-04-03 ENCOUNTER — Encounter: Payer: Self-pay | Admitting: Nurse Practitioner

## 2017-04-03 VITALS — BP 144/78 | HR 68 | Temp 98.1°F | Resp 16 | Ht 64.0 in | Wt 180.0 lb

## 2017-04-03 DIAGNOSIS — G894 Chronic pain syndrome: Secondary | ICD-10-CM | POA: Diagnosis not present

## 2017-04-03 DIAGNOSIS — M25551 Pain in right hip: Secondary | ICD-10-CM | POA: Diagnosis not present

## 2017-04-03 DIAGNOSIS — M79604 Pain in right leg: Secondary | ICD-10-CM | POA: Diagnosis not present

## 2017-04-03 DIAGNOSIS — Z7982 Long term (current) use of aspirin: Secondary | ICD-10-CM | POA: Diagnosis not present

## 2017-04-03 DIAGNOSIS — M791 Myalgia: Secondary | ICD-10-CM | POA: Diagnosis not present

## 2017-04-03 DIAGNOSIS — Z5181 Encounter for therapeutic drug level monitoring: Secondary | ICD-10-CM | POA: Insufficient documentation

## 2017-04-03 DIAGNOSIS — M545 Low back pain, unspecified: Secondary | ICD-10-CM

## 2017-04-03 DIAGNOSIS — M792 Neuralgia and neuritis, unspecified: Secondary | ICD-10-CM | POA: Diagnosis not present

## 2017-04-03 DIAGNOSIS — Z79899 Other long term (current) drug therapy: Secondary | ICD-10-CM | POA: Insufficient documentation

## 2017-04-03 DIAGNOSIS — M4696 Unspecified inflammatory spondylopathy, lumbar region: Secondary | ICD-10-CM

## 2017-04-03 DIAGNOSIS — G8929 Other chronic pain: Secondary | ICD-10-CM

## 2017-04-03 DIAGNOSIS — M47896 Other spondylosis, lumbar region: Secondary | ICD-10-CM | POA: Insufficient documentation

## 2017-04-03 DIAGNOSIS — M47816 Spondylosis without myelopathy or radiculopathy, lumbar region: Secondary | ICD-10-CM

## 2017-04-03 DIAGNOSIS — M7918 Myalgia, other site: Secondary | ICD-10-CM

## 2017-04-03 DIAGNOSIS — M150 Primary generalized (osteo)arthritis: Secondary | ICD-10-CM

## 2017-04-03 DIAGNOSIS — Z79891 Long term (current) use of opiate analgesic: Secondary | ICD-10-CM | POA: Diagnosis not present

## 2017-04-03 DIAGNOSIS — M159 Polyosteoarthritis, unspecified: Secondary | ICD-10-CM

## 2017-04-03 MED ORDER — MELOXICAM 7.5 MG PO TABS: 7.5000 mg | ORAL_TABLET | Freq: Every day | ORAL | 0 refills | Status: DC

## 2017-04-03 MED ORDER — HYDROCODONE-ACETAMINOPHEN 5-325 MG PO TABS: 1.0000 | ORAL_TABLET | Freq: Three times a day (TID) | ORAL | 0 refills | Status: DC | PRN

## 2017-04-03 MED ORDER — MAGNESIUM OXIDE 400 (241.3 MG) MG PO TABS: 1.0000 | ORAL_TABLET | Freq: Every day | ORAL | 0 refills | Status: DC

## 2017-04-03 MED ORDER — CYCLOBENZAPRINE HCL 10 MG PO TABS: 10.0000 mg | ORAL_TABLET | Freq: Every day | ORAL | 0 refills | Status: DC

## 2017-04-03 MED ORDER — GABAPENTIN 300 MG PO CAPS: 300.0000 mg | ORAL_CAPSULE | Freq: Two times a day (BID) | ORAL | 0 refills | Status: DC

## 2017-04-03 NOTE — Patient Instructions (Signed)
You have prescriptiions at the pharmacy to pick up.  You have been handed 3 prescriptions for hydrocodone today.

## 2017-04-03 NOTE — Progress Notes (Signed)
Nursing Pain Medication Assessment:  Safety precautions to be maintained throughout the outpatient stay will include: orient to surroundings, keep bed in low position, maintain call bell within reach at all times, provide assistance with transfer out of bed and ambulation.  Medication Inspection Compliance: Pill count conducted under aseptic conditions, in front of the patient. Neither the pills nor the bottle was removed from the patient's sight at any time. Once count was completed pills were immediately returned to the patient in their original bottle.  Medication: See above Pill/Patch Count: 78 of 90 pills remain Pill/Patch Appearance: Markings consistent with prescribed medication Bottle Appearance: Standard pharmacy container. Clearly labeled. Filled Date: 04 / 13 / 2018 Last Medication intake:  Today

## 2017-04-03 NOTE — Progress Notes (Signed)
Patient's Name: Wanda Martinez  MRN: [id number]  Referring Provider: Einar Pheasant, MD  DOB: 11-15-1935  PCP: Einar Pheasant, MD  DOS: 04/03/2017  Note by: Vevelyn Francois NP  Service setting: Ambulatory outpatient  Specialty: Interventional Pain Management  Location: [facility] (AMB) Pain Management Facility    Patient type: Established    Primary Reason(s) for Visit: Encounter for prescription drug management (Level of risk: moderate) CC: Back Pain (lower right); Hip Pain (right); and Leg Pain (right)  HPI  [name] is a 82 y.o. year old, female patient, who comes today for a medication management evaluation. She has Hypercholesterolemia; Essential hypertension; Frequent UTI; Primary cancer of upper inner quadrant of right female breast ([place]); Tremor; Toenail fungus; Headache; Obesity (BMI 30-39.9); Dysuria; Parkinsons ([place]); Fatigue; Chronic kidney disease (CKD), stage II (mild); Clinical depression; Incomplete bladder emptying; Mixed incontinence; HLD (hyperlipidemia); Bladder infection, chronic; Parkinson's disease ([place]); Pure hypercholesterolemia; Hernia, rectovaginal; Long term current use of opiate analgesic; Long term prescription opiate use; Opiate use; Encounter for therapeutic drug level monitoring; Encounter for chronic pain management; Chronic low back pain (Location of Primary Source of Pain) (Bilateral) (R>L); Lumbar spondylosis; Chronic hip pain; Chronic neck pain; Cervical spondylosis; Chronic cervical radicular pain (Right); Chronic upper extremity pain (Right); Osteoarthritis; Diffuse myofascial pain syndrome; Neurogenic pain; Chronic upper back pain (Right); Myofascial pain syndrome (Right) (cervicothoracic); Vaginitis; Lumbar facet syndrome (Location of Primary Source of Pain) (Bilateral) (R>L); Malignant neoplasm of right female breast ([place]); Chronic constipation; Cervical facet hypertrophy; Cervical facet syndrome ([place]); Chronic shoulder pain (Right); and Chronic pain syndrome on  her problem list. Her primarily concern today is the Back Pain (lower right); Hip Pain (right); and Leg Pain (right)  Pain Assessment: Self-Reported Pain Score: 4 /10 Clinically the patient looks like a 2/10 Reported level is inconsistent with clinical observations.       Pain Type: Chronic pain Pain Location: Back Pain Orientation: Lower, Right Pain Descriptors / Indicators: Aching, Constant Pain Frequency: Constant  [name] is in today for medication management. She has low back pain that radiates down her right side into her right leg. She has some weakness after standing for long periods. She feels like her pain is well treated with her current regimen.   The patient  reports that she does not use drugs. Her body mass index is 30.9 kg/m.  Further details on both, my assessment(s), as well as the proposed treatment plan, please see below.  Controlled Substance Pharmacotherapy Assessment REMS (Risk Evaluation and Mitigation Strategy)  Analgesic:Hydrocodone/APAP 5/325 one tablet every 8 hours (15 mg/day) MME/day:15 mg/day. [name], RN  04/03/2017 10:06 AM  Sign at close encounter Nursing Pain Medication Assessment:  Safety precautions to be maintained throughout the outpatient stay will include: orient to surroundings, keep bed in low position, maintain call bell within reach at all times, provide assistance with transfer out of bed and ambulation.  Medication Inspection Compliance: Pill count conducted under aseptic conditions, in front of the patient. Neither the pills nor the bottle was removed from the patient's sight at any time. Once count was completed pills were immediately returned to the patient in their original bottle.  Medication: See above Pill/Patch Count: 78 of 90 pills remain Pill/Patch Appearance: Markings consistent with prescribed medication Bottle Appearance: Standard pharmacy container. Clearly labeled. Filled Date: 04 / 13 / 2018 Last Medication  intake:  Today   Pharmacokinetics: Liberation and absorption (onset of action): WNL Distribution (time to peak effect): WNL Metabolism and excretion (duration of  action): WNL         Pharmacodynamics: Desired effects: Analgesia: Ms. Dicicco reports >50% benefit. Functional ability: Patient reports that medication allows her to accomplish basic ADLs Clinically meaningful improvement in function (CMIF): Sustained CMIF goals met Perceived effectiveness: Described as relatively effective, allowing for increase in activities of daily living (ADL) Undesirable effects: Side-effects or Adverse reactions: None reported Monitoring: [place] PMP: Online review of the past 45-monthperiod conducted. Compliant with practice rules and regulations List of all UDS test(s) done:  Lab Results  Component Value Date   TOXASSSELUR FINAL 03/27/2016   TCambridgeFINAL 12/29/2015   Last UDS on record: ToxAssure Select 13  Date Value Ref Range Status  03/27/2016 FINAL  Final    Comment:    ==================================================================== TOXASSURE SELECT 13 (MW) ==================================================================== Test                             Result       Flag       Units Drug Present and Declared for Prescription Verification   Hydrocodone                    644          EXPECTED   ng/mg creat   Norhydrocodone                 707          EXPECTED   ng/mg creat    Sources of hydrocodone include scheduled prescription    medications. Norhydrocodone is an expected metabolite of    hydrocodone. ==================================================================== Test                      Result    Flag   Units      Ref Range   Creatinine              68               mg/dL      >=20 ==================================================================== Declared Medications:  The flagging and interpretation on this report are based on the  following declared  medications.  Unexpected results may arise from  inaccuracies in the declared medications.  **Note: The testing scope of this panel includes these medications:  Hydrocodone (Norco)  Hydrocodone (Vicodin)  **Note: The testing scope of this panel does not include following  reported medications:  Acetaminophen (Norco)  Acetaminophen (Vicodin)  Amlodipine (Norvasc)  Aspirin  Carbidopa (Sinemet)  Citalopram (Celexa)  Cyclobenzaprine (Flexeril)  Gabapentin (Neurontin)  Hydrochlorothiazide (Hydrodiuril)  Letrozole (Femara)  Levodopa (Sinemet)  Losartan (Cozaar)  Magnesium (Mag-Ox)  Meloxicam (Mobic)  Metoprolol (Toprol)  Multivitamin  Omeprazole (Prilosec)  Pravastatin (Pravachol)  Supplement (OsCal)  Trazodone (Desyrel) ==================================================================== For clinical consultation, please call ([phone] ====================================================================    UDS interpretation: Compliant          Medication Assessment Form: Reviewed. Patient indicates being compliant with therapy Treatment compliance: Compliant Risk Assessment Profile: Aberrant behavior: See prior evaluations. None observed or detected today Comorbid factors increasing risk of overdose: See prior notes. No additional risks detected today Risk of substance use disorder (SUD): Low Opioid Risk Tool (ORT) Total Score: 3  Interpretation Table:  Score <3 = Low Risk for SUD  Score between 4-7 = Moderate Risk for SUD  Score >8 = High Risk for Opioid Abuse   Risk Mitigation Strategies:  Patient Counseling: Covered Patient-Prescriber Agreement (PPA): Present  and active  Notification to other healthcare providers: Done  Pharmacologic Plan: No change in therapy, at this time  Laboratory Chemistry  Inflammation Markers No results found for: CRP, ESRSEDRATE (CRP: Acute Phase) (ESR: Chronic Phase) Renal Function Markers Lab Results  Component Value Date    BUN 17 10/18/2016   CREATININE 0.98 10/18/2016   GFRAA >60 10/08/2014   GFRNONAA >60 10/08/2014   Hepatic Function Markers Lab Results  Component Value Date   AST 22 10/18/2016   ALT 12 10/18/2016   ALBUMIN 4.3 10/18/2016   ALKPHOS 71 10/18/2016   Electrolytes Lab Results  Component Value Date   NA 137 10/18/2016   K 4.6 10/18/2016   CL 98 10/18/2016   CALCIUM 9.7 10/18/2016   MG 1.8 04/21/2013   Neuropathy Markers Lab Results  Component Value Date   VITAMINB12 881 02/01/2016   Bone Pathology Markers Lab Results  Component Value Date   ALKPHOS 71 10/18/2016   CALCIUM 9.7 10/18/2016   Coagulation Parameters Lab Results  Component Value Date   PLT 244.0 10/18/2016   Cardiovascular Markers Lab Results  Component Value Date   HGB 12.3 10/18/2016   HCT 36.6 10/18/2016   Note: Lab results reviewed.  Recent Diagnostic Imaging Review  Dg C-arm 1-60 Min-no Report  Result Date: 01/22/2017 Fluoroscopy was utilized by the requesting physician.  No radiographic interpretation.   Note: Imaging results reviewed.          Meds  The patient has a current medication list which includes the following prescription(s): amlodipine, aspirin, calcium carbonate, carbidopa-levodopa, citalopram, cyclobenzaprine, gabapentin, glucosamine sulfate, hydrochlorothiazide, hydrocodone-acetaminophen, letrozole, losartan, losartan, lubiprostone, magnesium oxide, meloxicam, metoprolol succinate, multivitamin, omega-3, omeprazole, pravastatin, hydrocodone-acetaminophen, and hydrocodone-acetaminophen.  Current Outpatient Prescriptions on File Prior to Visit  Medication Sig  . amLODipine (NORVASC) 2.5 MG tablet TAKE 1 TABLET BY MOUTH ONCE DAILY.  Marland Kitchen aspirin 81 MG EC tablet Take 81 mg by mouth daily as needed.    . Calcium Carbonate (CALCIUM 600) 1500 MG TABS Take 1 tablet by mouth daily.    . carbidopa-levodopa (SINEMET IR) 25-100 MG tablet TAKE (1) TABLET BY MOUTH FOUR TIMES DAILY.  . citalopram  (CELEXA) 20 MG tablet TAKE 1 & 1/2 TABLETS BY MOUTH ONCE DAILY  . GLUCOSAMINE SULFATE PO Take by mouth.  . hydrochlorothiazide (HYDRODIURIL) 25 MG tablet TAKE 1 TABLET BY MOUTH ONCE DAILY. (PATIENT TAKE 1/2 ONCE DAILY AND EXTRA 1/2 IF NEEDED)  . letrozole (FEMARA) 2.5 MG tablet TAKE 1 TABLET BY MOUTH ONCE DAILY.  Marland Kitchen losartan (COZAAR) 100 MG tablet Take by mouth.  . losartan (COZAAR) 100 MG tablet TAKE 1 TABLET BY MOUTH ONCE DAILY.  Marland Kitchen lubiprostone (AMITIZA) 24 MCG capsule TAKE 1 CAPSULE BY MOUTH ONCE DAILY WITH BREAKFAST.  . metoprolol succinate (TOPROL-XL) 25 MG 24 hr tablet TAKE 1 TABLET BY MOUTH TWICE DAILY  . Multiple Vitamin (MULTIVITAMIN) tablet Take 1 tablet by mouth daily.  . Omega-3 1000 MG CAPS Take by mouth.  Marland Kitchen omeprazole (PRILOSEC) 20 MG capsule TAKE 1 CAPSULE BY MOUTH TWICE DAILY FOR REFLUX.  . pravastatin (PRAVACHOL) 20 MG tablet TAKE 1 TABLET BY MOUTH ONCE DAILY.   No current facility-administered medications on file prior to visit.    ROS  Constitutional: Denies any fever or chills Gastrointestinal: No reported hemesis, hematochezia, vomiting, or acute GI distress Musculoskeletal: Denies any acute onset joint swelling, redness, loss of ROM, or weakness Neurological: No reported episodes of acute onset apraxia, aphasia, dysarthria, agnosia, amnesia, paralysis, loss of coordination,  or loss of consciousness  Allergies  Ms. Dant is allergic to vesicare [solifenacin].  [place]  Drug: Ms. Rockefeller  reports that she does not use drugs. Alcohol:  reports that she does not drink alcohol. Tobacco:  reports that she has never smoked. She has never used smokeless tobacco. Medical:  has a past medical history of Anemia; Arm pain (07/26/2015); Arthritis; Arthritis, degenerative (03/26/2014); Back pain (11/01/2013); Breast cancer ([place]); Breast cancer ([place]); CHEST PAIN (04/29/2010); Chronic cystitis; Cystocele (02/22/2013); Cystocele, midline (08/19/2013); Degeneration of intervertebral disc of  lumbosacral region (03/26/2014); DYSPNEA (04/29/2010); Enthesopathy of hip (03/26/2014); GERD (gastroesophageal reflux disease); Hiatal hernia; HTN (hypertension); LBP (low back pain) (03/26/2014); Neck pain (11/01/2013); Parkinson disease ([place]); Sinusitis (02/07/2015); Skin lesions (07/12/2014); and Urinary incontinence. Family: family history includes Colon polyps in her father; Diabetes in her father; Parkinson's disease in her mother; Stroke in her father and mother.  Past Surgical History:  Procedure Laterality Date  . ABDOMINAL HYSTERECTOMY    . BREAST BIOPSY  2013  . BREAST ENHANCEMENT SURGERY  1987  . BREAST IMPLANT REMOVAL    . BREAST IMPLANT REMOVAL Right 08/29/2012  . BREAST SURGERY  1986   s/p left mastectomy  . COLONOSCOPY WITH PROPOFOL N/A 09/13/2016   Procedure: COLONOSCOPY WITH PROPOFOL;  Surgeon: Manya Silvas, MD;  Location: [facility] ENDOSCOPY;  Service: Endoscopy;  Laterality: N/A;  . MASTECTOMY  08/2012   right  . TONSILLECTOMY AND ADENOIDECTOMY  79  . VESICOVAGINAL FISTULA CLOSURE W/ TAH  1983   Constitutional Exam  General appearance: Well nourished, well developed, and well hydrated. In no apparent acute distress Vitals:   04/03/17 0954  BP: (!) 144/78  Pulse: 68  Resp: 16  Temp: 98.1 F (36.7 C)  TempSrc: Oral  SpO2: 100%  Weight: 180 lb (81.6 kg)  Height: '5\' 4"'$  (1.626 m)   BMI Assessment: Estimated body mass index is 30.9 kg/m as calculated from the following:   Height as of this encounter: '5\' 4"'$  (1.626 m).   Weight as of this encounter: 180 lb (81.6 kg).  BMI interpretation table: BMI level Category Range association with higher incidence of chronic pain  <18 kg/m2 Underweight   18.5-24.9 kg/m2 Ideal body weight   25-29.9 kg/m2 Overweight Increased incidence by 20%  30-34.9 kg/m2 Obese (Class I) Increased incidence by 68%  35-39.9 kg/m2 Severe obesity (Class II) Increased incidence by 136%  >40 kg/m2 Extreme obesity (Class III) Increased incidence by 254%    BMI Readings from Last 4 Encounters:  04/03/17 30.90 kg/m  02/20/17 30.90 kg/m  01/22/17 30.73 kg/m  01/16/17 30.90 kg/m   Wt Readings from Last 4 Encounters:  04/03/17 180 lb (81.6 kg)  02/20/17 180 lb (81.6 kg)  01/22/17 179 lb (81.2 kg)  01/16/17 180 lb (81.6 kg)  Psych/Mental status: Alert, oriented x 3 (person, place, & time)       Eyes: PERLA Respiratory: No evidence of acute respiratory distress  Cervical Spine Exam  Inspection: No masses, redness, or swelling Alignment: Symmetrical Functional ROM: Unrestricted ROM Stability: No instability detected Muscle strength & Tone: Functionally intact Sensory: Unimpaired Palpation: No palpable anomalies  Upper Extremity (UE) Exam    Side: Right upper extremity  Side: Left upper extremity  Inspection: No masses, redness, swelling, or asymmetry. No contractures  Inspection: No masses, redness, swelling, or asymmetry. No contractures  Functional ROM: Unrestricted ROM          Functional ROM: Unrestricted ROM  Muscle strength & Tone: Functionally intact  Muscle strength & Tone: Functionally intact  Sensory: Unimpaired  Sensory: Unimpaired  Palpation: No palpable anomalies  Palpation: No palpable anomalies  Specialized Test(s): Deferred         Specialized Test(s): Deferred          Thoracic Spine Exam  Inspection: No masses, redness, or swelling Alignment: Symmetrical Functional ROM: Unrestricted ROM Stability: No instability detected Sensory: Unimpaired Muscle strength & Tone: No palpable anomalies  Lumbar Spine Exam  Inspection: No masses, redness, or swelling Alignment: Symmetrical Functional ROM: Unrestricted ROM Stability: No instability detected Muscle strength & Tone: Functionally intact Sensory: Unimpaired Palpation: No palpable anomalies Provocative Tests: Lumbar Hyperextension and rotation test: evaluation deferred today       Patrick's Maneuver: evaluation deferred today              Gait &  Posture Assessment  Ambulation: Unassisted Gait: Age-related, senile gait pattern Posture: WNL   Lower Extremity Exam    Side: Right lower extremity  Side: Left lower extremity  Inspection: Normal skin color, temperature, and hair growth. No peripheral edema or cyanosis  Inspection: No masses, redness, swelling, or asymmetry. No contractures  Functional ROM: ROM is within normal limits (WNL) for all joints of the lower extremity  Functional ROM: Unrestricted ROM          Muscle strength & Tone: Functionally intact  Muscle strength & Tone: Functionally intact  Sensory: Unimpaired  Sensory: Unimpaired  Palpation: No palpable anomalies  Palpation: No palpable anomalies   Assessment  Primary Diagnosis & Pertinent Problem List: The primary encounter diagnosis was Lumbar spondylosis. Diagnoses of Lumbar facet syndrome (Location of Primary Source of Pain) (Bilateral) (R>L), Chronic low back pain (Location of Primary Source of Pain) (Bilateral) (R>L), Chronic pain syndrome, Diffuse myofascial pain syndrome, Neurogenic pain, and Primary osteoarthritis involving multiple joints were also pertinent to this visit.  Status Diagnosis  Controlled Controlled Controlled 1. Lumbar spondylosis   2. Lumbar facet syndrome (Location of Primary Source of Pain) (Bilateral) (R>L)   3. Chronic low back pain (Location of Primary Source of Pain) (Bilateral) (R>L)   4. Chronic pain syndrome   5. Diffuse myofascial pain syndrome   6. Neurogenic pain   7. Primary osteoarthritis involving multiple joints      Plan of Care  Pharmacotherapy (Medications Ordered): Meds ordered this encounter  Medications  . HYDROcodone-acetaminophen (NORCO/VICODIN) 5-325 MG tablet    Sig: Take 1 tablet by mouth every 8 (eight) hours as needed for severe pain.    Dispense:  90 tablet    Refill:  0    Do not place this medication, or any other prescription from our practice, on "Automatic Refill". Patient may have prescription  filled one day early if pharmacy is closed on scheduled refill date. Do not fill until: 04/29/17 To last until: 05/29/17  . HYDROcodone-acetaminophen (NORCO/VICODIN) 5-325 MG tablet    Sig: Take 1 tablet by mouth every 8 (eight) hours as needed for severe pain.    Dispense:  90 tablet    Refill:  0    Do not place this medication, or any other prescription from our practice, on "Automatic Refill". Patient may have prescription filled one day early if pharmacy is closed on scheduled refill date. Do not fill until: 05/29/17 To last until: 06/28/17  . HYDROcodone-acetaminophen (NORCO/VICODIN) 5-325 MG tablet    Sig: Take 1 tablet by mouth every 8 (eight) hours as needed for severe pain.  Dispense:  90 tablet    Refill:  0    Do not place this medication, or any other prescription from our practice, on "Automatic Refill". Patient may have prescription filled one day early if pharmacy is closed on scheduled refill date. Do not fill until: 06/28/17 To last until: 07/28/17  . cyclobenzaprine (FLEXERIL) 10 MG tablet    Sig: Take 1 tablet (10 mg total) by mouth at bedtime.    Dispense:  90 tablet    Refill:  0    Do not place this medication, or any other prescription from our practice, on "Automatic Refill". Patient may have prescription filled one day early if pharmacy is closed on scheduled refill date.  . magnesium oxide (MAGNESIUM-OXIDE) 400 (241.3 Mg) MG tablet    Sig: Take 1 tablet (400 mg total) by mouth daily.    Dispense:  90 tablet    Refill:  0    Do not place this medication, or any other prescription from our practice, on "Automatic Refill". Patient may have prescription filled one day early if pharmacy is closed on scheduled refill date.  . gabapentin (NEURONTIN) 300 MG capsule    Sig: Take 1 capsule (300 mg total) by mouth 2 (two) times daily.    Dispense:  180 capsule    Refill:  0    Do not place this medication, or any other prescription from our practice, on "Automatic  Refill". Patient may have prescription filled one day early if pharmacy is closed on scheduled refill date.  . meloxicam (MOBIC) 7.5 MG tablet    Sig: Take 1 tablet (7.5 mg total) by mouth daily.    Dispense:  90 tablet    Refill:  0    Do not place this medication, or any other prescription from our practice, on "Automatic Refill". Patient may have prescription filled one day early if pharmacy is closed on scheduled refill date.   New Prescriptions   No medications on file   Medications administered today: Ms. Pardoe had no medications administered during this visit. Lab-work, procedure(s), and/or referral(s): No orders of the defined types were placed in this encounter.  Imaging and/or referral(s): None  Interventional therapies: Planned, scheduled, and/or pending:   Not indicated   Considering:   Diagnostic right-sided lumbar facet block  Palliative right-sided lumbar facet radiofrequency ablation  Palliative Right suprascapular muscle trigger point injection  Diagnostic bilateral cervical facet block  Possible bilateral cervical facet radiofrequency ablation  Diagnostic right-sided cervical epidural steroid injection  Diagnostic intra-articular hip joint injection  Possible hip joint radiofrequency ablation **   Palliative PRN treatment(s):   Diagnostic right-sided lumbar facet block  Palliative right-sided lumbar facet radiofrequency ablation  Palliative Right suprascapular muscle trigger point injection  Diagnostic bilateral cervical facet block  Diagnostic right-sided cervical epidural steroid injection  Diagnostic intra-articular hip joint injection    Provider-requested follow-up: Return in about 3 months (around 07/03/2017) for Medication Mgmt.  Future Appointments Date Time Provider [place]  04/09/2017 3:15 PM Lloyd Huger, MD [facility] None  04/19/2017 1:30 PM Einar Pheasant, MD [facility] None  05/02/2017 11:00 AM Noreene Filbert, MD [facility]  None  07/02/2017 10:30 AM Vevelyn Francois, NP [facility] None  10/26/2017 1:00 PM Denisa L O'Brien-Blaney, LPN [facility] None   Primary Care Physician: Einar Pheasant, MD Location: [facility] Outpatient [facility] Management Facility Note by: Vevelyn Francois NP Date: 04/03/2017; Time: 3:28 PM  Pain Score Disclaimer: We use the NRS-11 scale. This is a self-reported, subjective measurement  of pain severity with only modest accuracy. It is used primarily to identify changes within a particular patient. It must be understood that outpatient pain scales are significantly less accurate that those used for research, where they can be applied under ideal controlled circumstances with minimal exposure to variables. In reality, the score is likely to be a combination of pain intensity and pain affect, where pain affect describes the degree of emotional arousal or changes in action readiness caused by the sensory experience of pain. Factors such as social and work situation, setting, emotional state, anxiety levels, expectation, and prior pain experience may influence pain perception and show large inter-individual differences that may also be affected by time variables.  Patient instructions provided during this appointment: Patient Instructions  You have prescriptiions at the pharmacy to pick up.  You have been handed 3 prescriptions for hydrocodone today.

## 2017-04-09 ENCOUNTER — Inpatient Hospital Stay: Payer: Medicare Other | Attending: Oncology | Admitting: Oncology

## 2017-04-09 VITALS — BP 114/71 | HR 63 | Temp 98.6°F | Resp 18 | Wt 184.8 lb

## 2017-04-09 DIAGNOSIS — K219 Gastro-esophageal reflux disease without esophagitis: Secondary | ICD-10-CM | POA: Insufficient documentation

## 2017-04-09 DIAGNOSIS — M545 Low back pain: Secondary | ICD-10-CM | POA: Insufficient documentation

## 2017-04-09 DIAGNOSIS — M2550 Pain in unspecified joint: Secondary | ICD-10-CM | POA: Insufficient documentation

## 2017-04-09 DIAGNOSIS — M8580 Other specified disorders of bone density and structure, unspecified site: Secondary | ICD-10-CM | POA: Insufficient documentation

## 2017-04-09 DIAGNOSIS — R32 Unspecified urinary incontinence: Secondary | ICD-10-CM | POA: Diagnosis not present

## 2017-04-09 DIAGNOSIS — Z7982 Long term (current) use of aspirin: Secondary | ICD-10-CM | POA: Insufficient documentation

## 2017-04-09 DIAGNOSIS — K449 Diaphragmatic hernia without obstruction or gangrene: Secondary | ICD-10-CM | POA: Diagnosis not present

## 2017-04-09 DIAGNOSIS — M129 Arthropathy, unspecified: Secondary | ICD-10-CM | POA: Insufficient documentation

## 2017-04-09 DIAGNOSIS — Z9013 Acquired absence of bilateral breasts and nipples: Secondary | ICD-10-CM | POA: Diagnosis not present

## 2017-04-09 DIAGNOSIS — M85859 Other specified disorders of bone density and structure, unspecified thigh: Secondary | ICD-10-CM

## 2017-04-09 DIAGNOSIS — I10 Essential (primary) hypertension: Secondary | ICD-10-CM | POA: Diagnosis not present

## 2017-04-09 DIAGNOSIS — M5136 Other intervertebral disc degeneration, lumbar region: Secondary | ICD-10-CM | POA: Diagnosis not present

## 2017-04-09 DIAGNOSIS — G20 Parkinson's disease: Secondary | ICD-10-CM | POA: Diagnosis not present

## 2017-04-09 DIAGNOSIS — Z79899 Other long term (current) drug therapy: Secondary | ICD-10-CM | POA: Insufficient documentation

## 2017-04-09 DIAGNOSIS — C50211 Malignant neoplasm of upper-inner quadrant of right female breast: Secondary | ICD-10-CM | POA: Diagnosis not present

## 2017-04-09 DIAGNOSIS — Z170 Estrogen receptor positive status [ER+]: Secondary | ICD-10-CM | POA: Diagnosis not present

## 2017-04-09 DIAGNOSIS — Z79811 Long term (current) use of aromatase inhibitors: Secondary | ICD-10-CM | POA: Insufficient documentation

## 2017-04-09 NOTE — Progress Notes (Signed)
Complains of left hip pain that started today. Has been getting routine breast exams by PCP.

## 2017-04-09 NOTE — Progress Notes (Signed)
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  INTERVAL HISTORY: Patient returns to clinic today for routine 6 month followup. Her Parkinson's symptoms continue to be well controlled with her current medication regimen. She is tolerating letrozole without significant side effects. She has no new neurologic complaints.  She denies any recent fevers.  She has a good appetite and denies weight loss.  She denies any chest pain or shortness of breath.  She denies any nausea, vomiting, constipation, or diarrhea.  She has no urinary complaints.  Patient offers no further specific complaints today.   REVIEW OF SYSTEMS:   Review of Systems  Constitutional: Negative.  Negative for fever, malaise/fatigue and weight loss.  Respiratory: Negative.  Negative for cough and shortness of breath.   Cardiovascular: Negative.  Negative for chest pain and leg swelling.  Gastrointestinal: Negative.  Negative for abdominal pain.  Genitourinary: Negative.   Musculoskeletal: Positive for joint pain.  Neurological: Positive for tremors. Negative for weakness.  Psychiatric/Behavioral: Negative.  Negative for depression. The patient is not nervous/anxious.     As per HPI. Otherwise, a complete review of systems is negative.  PAST MEDICAL HISTORY: Past Medical History:  Diagnosis Date  . Anemia   . Arm pain 07/26/2015  . Arthritis   . Arthritis, degenerative 03/26/2014  . Back pain 11/01/2013  . Breast cancer (HCC)    Masectomy - left - 1986   . Breast cancer [name])    Mastectomy-right -2014  . CHEST PAIN 04/29/2010   Qualifier: Diagnosis of  By: Laural Benes RN, Cicero Duck    . Chronic cystitis   . Cystocele 02/22/2013  .  Cystocele, midline 08/19/2013  . Degeneration of intervertebral disc of lumbosacral region 03/26/2014  . DYSPNEA 04/29/2010   Qualifier: Diagnosis of  By: Laural Benes RN, Cicero Duck    . Enthesopathy of hip 03/26/2014  . GERD (gastroesophageal reflux disease)   . Hiatal hernia   . HTN (hypertension)   . LBP (low back pain) 03/26/2014  . Neck pain 11/01/2013  . Parkinson disease (HCC)   . Sinusitis 02/07/2015  . Skin lesions 07/12/2014  . Urinary incontinence    mixed     PAST SURGICAL HISTORY: Past Surgical History:  Procedure Laterality Date  . ABDOMINAL HYSTERECTOMY    . BREAST BIOPSY  2013  . BREAST ENHANCEMENT SURGERY  1987  . BREAST IMPLANT REMOVAL    . BREAST IMPLANT REMOVAL Right 08/29/2012  . BREAST SURGERY  1986   s/p left mastectomy  . COLONOSCOPY WITH PROPOFOL N/A 09/13/2016   Procedure: COLONOSCOPY WITH PROPOFOL;  Surgeon: Scot Jun, MD;  Location: [facility] ENDOSCOPY;  Service: Endoscopy;  Laterality: N/A;  . MASTECTOMY  08/2012   right  . TONSILLECTOMY AND ADENOIDECTOMY  79  . VESICOVAGINAL FISTULA CLOSURE W/ TAH  1983    FAMILY HISTORY Family History  Problem Relation Age of Onset  . Diabetes Father   . Stroke Father   . Colon polyps Father   . Stroke Mother   . Parkinson's disease Mother        ADVANCED DIRECTIVES:    HEALTH MAINTENANCE: Social History  Substance Use Topics  . Smoking status: Never Smoker  . Smokeless tobacco: Never Used     Comment: tobacco  use - no  . Alcohol use No     Allergies  Allergen Reactions  . Vesicare [Solifenacin] Other (See Comments)    Constipation Constipation    Current Outpatient Prescriptions  Medication Sig Dispense Refill  . amLODipine (NORVASC) 2.5 MG tablet TAKE 1 TABLET BY MOUTH ONCE DAILY. 30 tablet 0  . aspirin 81 MG EC tablet Take 81 mg by mouth daily as needed.      . Calcium Carbonate (CALCIUM 600) 1500 MG TABS Take 1 tablet by mouth daily.      . carbidopa-levodopa (SINEMET IR) 25-100 MG tablet TAKE (1)  TABLET BY MOUTH FOUR TIMES DAILY.    . citalopram (CELEXA) 20 MG tablet TAKE 1 & 1/2 TABLETS BY MOUTH ONCE DAILY    . cyclobenzaprine (FLEXERIL) 10 MG tablet Take 1 tablet (10 mg total) by mouth at bedtime. 90 tablet 0  . gabapentin (NEURONTIN) 300 MG capsule Take 1 capsule (300 mg total) by mouth 2 (two) times daily. 180 capsule 0  . GLUCOSAMINE SULFATE PO Take by mouth.    . hydrochlorothiazide (HYDRODIURIL) 25 MG tablet TAKE 1 TABLET BY MOUTH ONCE DAILY. (PATIENT TAKE 1/2 ONCE DAILY AND EXTRA 1/2 IF NEEDED) 30 tablet 0  . [START ON 04/29/2017] HYDROcodone-acetaminophen (NORCO/VICODIN) 5-325 MG tablet Take 1 tablet by mouth every 8 (eight) hours as needed for severe pain. 90 tablet 0  . letrozole (FEMARA) 2.5 MG tablet TAKE 1 TABLET BY MOUTH ONCE DAILY. 90 tablet 0  . losartan (COZAAR) 100 MG tablet TAKE 1 TABLET BY MOUTH ONCE DAILY. 30 tablet 0  . lubiprostone (AMITIZA) 24 MCG capsule TAKE 1 CAPSULE BY MOUTH ONCE DAILY WITH BREAKFAST.    . magnesium oxide (MAGNESIUM-OXIDE) 400 (241.3 Mg) MG tablet Take 1 tablet (400 mg total) by mouth daily. 90 tablet 0  . meloxicam (MOBIC) 7.5 MG tablet Take 1 tablet (7.5 mg total) by mouth daily. 90 tablet 0  . metoprolol succinate (TOPROL-XL) 25 MG 24 hr tablet TAKE 1 TABLET BY MOUTH TWICE DAILY 60 tablet 0  . Multiple Vitamin (MULTIVITAMIN) tablet Take 1 tablet by mouth daily.    . Omega-3 1000 MG CAPS Take by mouth.    Marland Kitchen omeprazole (PRILOSEC) 20 MG capsule TAKE 1 CAPSULE BY MOUTH TWICE DAILY FOR REFLUX. 60 capsule 0  . pravastatin (PRAVACHOL) 20 MG tablet TAKE 1 TABLET BY MOUTH ONCE DAILY. 30 tablet 0  . [START ON 05/29/2017] HYDROcodone-acetaminophen (NORCO/VICODIN) 5-325 MG tablet Take 1 tablet by mouth every 8 (eight) hours as needed for severe pain. (Patient not taking: Reported on 04/09/2017) 90 tablet 0  . [START ON 06/28/2017] HYDROcodone-acetaminophen (NORCO/VICODIN) 5-325 MG tablet Take 1 tablet by mouth every 8 (eight) hours as needed for severe pain.  (Patient not taking: Reported on 04/09/2017) 90 tablet 0   No current facility-administered medications for this visit.     OBJECTIVE: Vitals:   04/09/17 1508  BP: 114/71  Pulse: 63  Resp: 18  Temp: 98.6 F (37 C)     Body mass index is 31.72 kg/m.    ECOG FS:0 - Asymptomatic  General: Well-developed, well-nourished, no acute distress. Eyes: Pink conjunctiva, anicteric sclera. Breasts: Bilateral mastectomy, no evidence of recurrence. Lungs: Clear to auscultation bilaterally. Heart: Regular rate and rhythm. No rubs, murmurs, or gallops. Abdomen: Soft, nontender, nondistended. No organomegaly noted, normoactive bowel sounds. Musculoskeletal: No edema, cyanosis, or clubbing. Neuro: Alert, answering all questions appropriately. Cranial nerves grossly intact. Skin: No rashes or petechiae noted. Psych: Normal affect.  LAB RESULTS:  Lab Results  Component Value Date   NA 137 10/18/2016   K 4.6 10/18/2016   CL 98 10/18/2016   CO2 33 (H) 10/18/2016   GLUCOSE 85 10/18/2016   BUN 17 10/18/2016   CREATININE 0.98 10/18/2016   CALCIUM 9.7 10/18/2016   PROT 7.3 10/18/2016   ALBUMIN 4.3 10/18/2016   AST 22 10/18/2016   ALT 12 10/18/2016   ALKPHOS 71 10/18/2016   BILITOT 0.4 10/18/2016   GFRNONAA >60 10/08/2014   GFRAA >60 10/08/2014    Lab Results  Component Value Date   WBC 5.6 10/18/2016   NEUTROABS 3.3 10/18/2016   HGB 12.3 10/18/2016   HCT 36.6 10/18/2016   MCV 88.0 10/18/2016   PLT 244.0 10/18/2016     STUDIES: No results found.  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  PLAN:    1.  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 Continue letrozole completing 5 years of therapy in May 2019. Patient has had bilateral mastectomies, therefore no further mammograms are necessary. Patient finished her  chemotherapy in January 2014. She did not require XRT given her bilateral mastectomies. Return to clinic in 6 months for routine evaluation. 2. Osteopenia: Patient's most recent bone mineral density on June 21, 2016 was reported as -2.3. This is slightly worse than one year prior. Continue Calcium and Vitamin D and consider Fosamax in the future. Repeat in July 2018.  3.  Tremors/Parkinson's: Stable. Treatment per neurology.   Patient expressed understanding and was in agreement with this plan. She also understands that She can call clinic at any time with any questions, concerns, or complaints.   Breast cancer [facility])   Staging form: Breast, AJCC 7th Edition     Clinical stage from 09/22/2015: Stage IIA (T1c, N1, M0) - Signed by Lloyd Huger, MD on 09/22/2015   Lloyd Huger, MD   04/09/2017 3:20 PM

## 2017-04-18 DIAGNOSIS — R251 Tremor, unspecified: Secondary | ICD-10-CM | POA: Diagnosis not present

## 2017-04-18 DIAGNOSIS — G20 Parkinson's disease: Secondary | ICD-10-CM | POA: Diagnosis not present

## 2017-04-18 DIAGNOSIS — M542 Cervicalgia: Secondary | ICD-10-CM | POA: Diagnosis not present

## 2017-04-19 ENCOUNTER — Ambulatory Visit (INDEPENDENT_AMBULATORY_CARE_PROVIDER_SITE_OTHER): Payer: Medicare Other | Admitting: Internal Medicine

## 2017-04-19 ENCOUNTER — Encounter: Payer: Self-pay | Admitting: Internal Medicine

## 2017-04-19 VITALS — BP 110/72 | HR 74 | Temp 98.3°F | Resp 12 | Ht 64.0 in | Wt 185.2 lb

## 2017-04-19 DIAGNOSIS — R350 Frequency of micturition: Secondary | ICD-10-CM | POA: Diagnosis not present

## 2017-04-19 DIAGNOSIS — E669 Obesity, unspecified: Secondary | ICD-10-CM | POA: Diagnosis not present

## 2017-04-19 DIAGNOSIS — F329 Major depressive disorder, single episode, unspecified: Secondary | ICD-10-CM | POA: Diagnosis not present

## 2017-04-19 DIAGNOSIS — M545 Low back pain: Secondary | ICD-10-CM

## 2017-04-19 DIAGNOSIS — G20 Parkinson's disease: Secondary | ICD-10-CM

## 2017-04-19 DIAGNOSIS — E7800 Pure hypercholesterolemia, unspecified: Secondary | ICD-10-CM | POA: Diagnosis not present

## 2017-04-19 DIAGNOSIS — I10 Essential (primary) hypertension: Secondary | ICD-10-CM

## 2017-04-19 DIAGNOSIS — F32A Depression, unspecified: Secondary | ICD-10-CM

## 2017-04-19 DIAGNOSIS — G8929 Other chronic pain: Secondary | ICD-10-CM

## 2017-04-19 DIAGNOSIS — N390 Urinary tract infection, site not specified: Secondary | ICD-10-CM

## 2017-04-19 DIAGNOSIS — N182 Chronic kidney disease, stage 2 (mild): Secondary | ICD-10-CM

## 2017-04-19 DIAGNOSIS — C50211 Malignant neoplasm of upper-inner quadrant of right female breast: Secondary | ICD-10-CM | POA: Diagnosis not present

## 2017-04-19 LAB — URINALYSIS, ROUTINE W REFLEX MICROSCOPIC
Bilirubin Urine: NEGATIVE
Hgb urine dipstick: NEGATIVE
Ketones, ur: NEGATIVE
Nitrite: NEGATIVE
PH: 6 (ref 5.0–8.0)
RBC / HPF: NONE SEEN (ref 0–?)
SPECIFIC GRAVITY, URINE: 1.015 (ref 1.000–1.030)
TOTAL PROTEIN, URINE-UPE24: NEGATIVE
Urine Glucose: NEGATIVE
Urobilinogen, UA: 0.2 (ref 0.0–1.0)

## 2017-04-19 NOTE — Progress Notes (Addendum)
Patient ID: Wanda Martinez, female   DOB: 03/21/1935, 82 y.o.   MRN: [id number]   Subjective:    Patient ID: Wanda Martinez, female    DOB: [birth year], 82 y.o.   MRN: [id number]  HPI  Patient with past history of GERD, breast cancer, hypertension and hypercholesterolemia.  She comes in today to follow up on these issues as well as for a complete physical exam.  She has been having increased back pain.  Has been followed by pain clinic.  Epidural not helping.  Some pain in lower back and some right leg pain.  Also now having some left leg pain as well.  Request to see Dr Vertell Limber for evaluation.  Tries to stay as active as possible.  No chest pain.  No sob.  No acid reflux.  Reports some increased urinary frequency.  Wants to be checked for possible uti.  Eating and drinking.     Past Medical History:  Diagnosis Date  . Anemia   . Arm pain 07/26/2015  . Arthritis   . Arthritis, degenerative 03/26/2014  . Back pain 11/01/2013  . Breast cancer ([place])    Masectomy - left - 1986   . Breast cancer [facility])    Mastectomy-right -2014  . CHEST PAIN 04/29/2010   Qualifier: Diagnosis of  By: Wynetta Emery RN, Doroteo Bradford    . Chronic cystitis   . Cystocele 02/22/2013  . Cystocele, midline 08/19/2013  . Degeneration of intervertebral disc of lumbosacral region 03/26/2014  . DYSPNEA 04/29/2010   Qualifier: Diagnosis of  By: Wynetta Emery RN, Doroteo Bradford    . Enthesopathy of hip 03/26/2014  . GERD (gastroesophageal reflux disease)   . Hiatal hernia   . HTN (hypertension)   . LBP (low back pain) 03/26/2014  . Neck pain 11/01/2013  . Parkinson disease ([place])   . Sinusitis 02/07/2015  . Skin lesions 07/12/2014  . Urinary incontinence    mixed    Past Surgical History:  Procedure Laterality Date  . ABDOMINAL HYSTERECTOMY    . BREAST BIOPSY  2013  . BREAST ENHANCEMENT SURGERY  1987  . BREAST IMPLANT REMOVAL    . BREAST IMPLANT REMOVAL Right 08/29/2012  . BREAST SURGERY  1986   s/p left mastectomy  . COLONOSCOPY WITH PROPOFOL  N/A 09/13/2016   Procedure: COLONOSCOPY WITH PROPOFOL;  Surgeon: Manya Silvas, MD;  Location: [facility] ENDOSCOPY;  Service: Endoscopy;  Laterality: N/A;  . MASTECTOMY  08/2012   right  . TONSILLECTOMY AND ADENOIDECTOMY  79  . VESICOVAGINAL FISTULA CLOSURE W/ TAH  1983   Family History  Problem Relation Age of Onset  . Diabetes Father   . Stroke Father   . Colon polyps Father   . Stroke Mother   . Parkinson's disease Mother    Social History   Social History  . Marital status: Married    Spouse name: N/A  . Number of children: 3  . Years of education: N/A   Social History Main Topics  . Smoking status: Never Smoker  . Smokeless tobacco: Never Used     Comment: tobacco use - no  . Alcohol use No  . Drug use: No  . Sexual activity: No   Other Topics Concern  . None   Social History Narrative  . None    Outpatient Encounter Prescriptions as of 04/19/2017  Medication Sig  . amLODipine (NORVASC) 2.5 MG tablet TAKE 1 TABLET BY MOUTH ONCE DAILY.  Marland Kitchen aspirin 81 MG EC tablet Take 81 mg by  mouth daily as needed.    . Calcium Carbonate (CALCIUM 600) 1500 MG TABS Take 1 tablet by mouth daily.    . carbidopa-levodopa (SINEMET IR) 25-100 MG tablet Take by mouth.  . citalopram (CELEXA) 20 MG tablet TAKE 1 & 1/2 TABLETS BY MOUTH ONCE DAILY  . cyclobenzaprine (FLEXERIL) 10 MG tablet Take 1 tablet (10 mg total) by mouth at bedtime.  . gabapentin (NEURONTIN) 300 MG capsule Take 1 capsule (300 mg total) by mouth 2 (two) times daily.  Marland Kitchen GLUCOSAMINE SULFATE PO Take by mouth.  . hydrochlorothiazide (HYDRODIURIL) 25 MG tablet TAKE 1 TABLET BY MOUTH ONCE DAILY. (PATIENT TAKE 1/2 ONCE DAILY AND EXTRA 1/2 IF NEEDED)  . [START ON 04/29/2017] HYDROcodone-acetaminophen (NORCO/VICODIN) 5-325 MG tablet Take 1 tablet by mouth every 8 (eight) hours as needed for severe pain.  Derrill Memo ON 05/29/2017] HYDROcodone-acetaminophen (NORCO/VICODIN) 5-325 MG tablet Take 1 tablet by mouth every 8 (eight) hours as  needed for severe pain.  Derrill Memo ON 06/28/2017] HYDROcodone-acetaminophen (NORCO/VICODIN) 5-325 MG tablet Take 1 tablet by mouth every 8 (eight) hours as needed for severe pain.  Marland Kitchen letrozole (FEMARA) 2.5 MG tablet TAKE 1 TABLET BY MOUTH ONCE DAILY.  Marland Kitchen losartan (COZAAR) 100 MG tablet TAKE 1 TABLET BY MOUTH ONCE DAILY.  Marland Kitchen lubiprostone (AMITIZA) 24 MCG capsule TAKE 1 CAPSULE BY MOUTH ONCE DAILY WITH BREAKFAST.  . magnesium oxide (MAGNESIUM-OXIDE) 400 (241.3 Mg) MG tablet Take 1 tablet (400 mg total) by mouth daily.  . meloxicam (MOBIC) 7.5 MG tablet Take 1 tablet (7.5 mg total) by mouth daily.  . metoprolol succinate (TOPROL-XL) 25 MG 24 hr tablet TAKE 1 TABLET BY MOUTH TWICE DAILY  . Multiple Vitamin (MULTIVITAMIN) tablet Take 1 tablet by mouth daily.  . Omega-3 1000 MG CAPS Take by mouth.  Marland Kitchen omeprazole (PRILOSEC) 20 MG capsule TAKE 1 CAPSULE BY MOUTH TWICE DAILY FOR REFLUX.  . pravastatin (PRAVACHOL) 20 MG tablet TAKE 1 TABLET BY MOUTH ONCE DAILY.  . [DISCONTINUED] carbidopa-levodopa (SINEMET IR) 25-100 MG tablet TAKE (1) TABLET BY MOUTH FOUR TIMES DAILY.   No facility-administered encounter medications on file as of 04/19/2017.     Review of Systems  Constitutional: Negative for appetite change and unexpected weight change.  HENT: Negative for congestion and sinus pressure.   Eyes: Negative for pain and visual disturbance.  Respiratory: Negative for cough, chest tightness and shortness of breath.   Cardiovascular: Negative for chest pain, palpitations and leg swelling.  Gastrointestinal: Negative for abdominal pain, diarrhea, nausea and vomiting.  Genitourinary: Positive for frequency. Negative for difficulty urinating.  Musculoskeletal: Positive for back pain. Negative for joint swelling.  Skin: Negative for color change and rash.  Neurological: Negative for dizziness, light-headedness and headaches.  Hematological: Negative for adenopathy. Does not bruise/bleed easily.    Psychiatric/Behavioral: Negative for agitation and dysphoric mood.       Objective:    Physical Exam  Constitutional: She appears well-developed and well-nourished. No distress.  HENT:  Nose: Nose normal.  Mouth/Throat: Oropharynx is clear and moist.  Eyes: Conjunctivae are normal. Right eye exhibits no discharge. Left eye exhibits no discharge.  Neck: Neck supple. No thyromegaly present.  Cardiovascular: Normal rate and regular rhythm.   Pulmonary/Chest: Breath sounds normal. No respiratory distress. She has no wheezes.  Breast exam - s/p mastectomy.  Could not appreciate any nodules or axillary adenopathy.    Abdominal: Soft. Bowel sounds are normal. There is no tenderness.  Musculoskeletal: She exhibits no edema or tenderness.  Lymphadenopathy:  She has no cervical adenopathy.  Skin: No rash noted. No erythema.  Psychiatric: She has a normal mood and affect. Her behavior is normal.    BP 110/72 (BP Location: Left Arm, Patient Position: Sitting, Cuff Size: Normal)   Pulse 74   Temp 98.3 F (36.8 C) (Oral)   Resp 12   Ht 5\' 4"  (1.626 m)   Wt 185 lb 3.2 oz (84 kg)   LMP 12/18/1981   SpO2 97%   BMI 31.79 kg/m  Wt Readings from Last 3 Encounters:  04/19/17 185 lb 3.2 oz (84 kg)  04/09/17 184 lb 12.8 oz (83.8 kg)  04/03/17 180 lb (81.6 kg)     Lab Results  Component Value Date   WBC 5.6 10/18/2016   HGB 12.3 10/18/2016   HCT 36.6 10/18/2016   PLT 244.0 10/18/2016   GLUCOSE 85 10/18/2016   CHOL 180 10/18/2016   TRIG 229.0 (H) 10/18/2016   HDL 39.50 10/18/2016   LDLDIRECT 100.0 10/18/2016   LDLCALC 70 06/16/2016   ALT 12 10/18/2016   AST 22 10/18/2016   NA 137 10/18/2016   K 4.6 10/18/2016   CL 98 10/18/2016   CREATININE 0.98 10/18/2016   BUN 17 10/18/2016   CO2 33 (H) 10/18/2016   TSH 2.69 02/01/2016        Assessment & Plan:   Problem List Items Addressed This Visit    Chronic kidney disease (CKD), stage II (mild)    Stay hydrated.  On  meloxicam.  Creatinine has been stable.  Follow renal function.        Chronic low back pain (Location of Primary Source of Pain) (Bilateral) (R>L) (Chronic)    Persistent low back pain and pain into legs as outlined.  Has been followed at pain clinic.  Last MRI 2014.  Given persistent and increased pain, she request to see Dr Vertell Limber for evaluation.        Relevant Orders   Ambulatory referral to Neurosurgery   Clinical depression    On citalopram.  Stable.       Essential hypertension    Blood pressure under good control.  Continue same medication regimen.  Follow pressures.  Follow metabolic panel.        Relevant Orders   TSH   Basic metabolic panel   Frequent UTI    Having urinary frequency now.  Request to have urine checked to confirm no infection.        Hypercholesterolemia    On pravastatin.  Low cholesterol diet and exercise.  Follow lipid panel and liver function tests.        Relevant Orders   Lipid panel   Hepatic function panel   Obesity (BMI 30-39.9)    Diet and exercise.  Follow.       Parkinsons ([place])    Followed by neurology.  Stable.        Relevant Medications   carbidopa-levodopa (SINEMET IR) 25-100 MG tablet   Primary cancer of upper inner quadrant of right female breast [facility])    s/p bilateral mastectomy.  On letrozole.  Followed by Dr Grayland Ormond.         Other Visit Diagnoses    Urinary frequency    -  Primary   Relevant Orders   Urinalysis, Routine w reflex microscopic (Completed)   Urine Culture (Completed)       Einar Pheasant, MD

## 2017-04-19 NOTE — Progress Notes (Signed)
Pre-visit discussion using our clinic review tool. No additional management support is needed unless otherwise documented below in the visit note.  

## 2017-04-20 LAB — URINE CULTURE: Organism ID, Bacteria: NO GROWTH

## 2017-04-23 NOTE — Assessment & Plan Note (Signed)
Having urinary frequency now.  Request to have urine checked to confirm no infection.

## 2017-04-23 NOTE — Assessment & Plan Note (Signed)
On citalopram.  Stable.   

## 2017-04-23 NOTE — Assessment & Plan Note (Signed)
Stay hydrated.  On meloxicam.  Creatinine has been stable.  Follow renal function.

## 2017-04-23 NOTE — Assessment & Plan Note (Signed)
Blood pressure under good control.  Continue same medication regimen.  Follow pressures.  Follow metabolic panel.   

## 2017-04-23 NOTE — Assessment & Plan Note (Signed)
On pravastatin.  Low cholesterol diet and exercise.  Follow lipid panel and liver function tests.   

## 2017-04-23 NOTE — Assessment & Plan Note (Signed)
Diet and exercise.  Follow.  

## 2017-04-23 NOTE — Addendum Note (Signed)
Addended by: Alisa Graff on: 04/23/2017 03:44 PM   Modules accepted: Orders

## 2017-04-23 NOTE — Assessment & Plan Note (Signed)
Followed by neurology.  Stable  

## 2017-04-23 NOTE — Assessment & Plan Note (Signed)
Persistent low back pain and pain into legs as outlined.  Has been followed at pain clinic.  Last MRI 2014.  Given persistent and increased pain, she request to see Dr Vertell Limber for evaluation.

## 2017-04-23 NOTE — Assessment & Plan Note (Signed)
s/p bilateral mastectomy.  On letrozole.  Followed by Dr Grayland Ormond.

## 2017-04-25 ENCOUNTER — Other Ambulatory Visit: Payer: Self-pay | Admitting: Internal Medicine

## 2017-04-27 ENCOUNTER — Other Ambulatory Visit: Payer: Self-pay | Admitting: Oncology

## 2017-05-01 ENCOUNTER — Telehealth: Payer: Self-pay | Admitting: Internal Medicine

## 2017-05-01 DIAGNOSIS — G8929 Other chronic pain: Secondary | ICD-10-CM

## 2017-05-01 DIAGNOSIS — M545 Low back pain: Principal | ICD-10-CM

## 2017-05-01 NOTE — Telephone Encounter (Signed)
Pt needs order for MRI Lumbar at [facility] no time/date preferences

## 2017-05-01 NOTE — Telephone Encounter (Signed)
Pt called and stated that she got an appointment with Dr. Vertell Limber would like p[t to have an MRI of her back before her appointment with him on 06/13/17. Please advise, thank you!  Call pt @ [phone]

## 2017-05-02 ENCOUNTER — Encounter: Payer: Self-pay | Admitting: Radiation Oncology

## 2017-05-02 ENCOUNTER — Ambulatory Visit
Admission: RE | Admit: 2017-05-02 | Discharge: 2017-05-02 | Disposition: A | Discharge status: Home / Self Care | Payer: Medicare Other | Source: Ambulatory Visit | Attending: Radiation Oncology | Admitting: Radiation Oncology

## 2017-05-02 VITALS — BP 130/75 | HR 68 | Temp 98.2°F | Resp 18 | Wt 184.3 lb

## 2017-05-02 DIAGNOSIS — Z923 Personal history of irradiation: Secondary | ICD-10-CM | POA: Insufficient documentation

## 2017-05-02 DIAGNOSIS — C50811 Malignant neoplasm of overlapping sites of right female breast: Secondary | ICD-10-CM | POA: Diagnosis not present

## 2017-05-02 DIAGNOSIS — Z79811 Long term (current) use of aromatase inhibitors: Secondary | ICD-10-CM | POA: Insufficient documentation

## 2017-05-02 DIAGNOSIS — Z9221 Personal history of antineoplastic chemotherapy: Secondary | ICD-10-CM | POA: Diagnosis not present

## 2017-05-02 DIAGNOSIS — Z9011 Acquired absence of right breast and nipple: Secondary | ICD-10-CM | POA: Diagnosis not present

## 2017-05-02 DIAGNOSIS — Z170 Estrogen receptor positive status [ER+]: Secondary | ICD-10-CM | POA: Insufficient documentation

## 2017-05-02 NOTE — Telephone Encounter (Signed)
Patient was informed that someone would call with appointment at last phone call with her.

## 2017-05-02 NOTE — Progress Notes (Signed)
Radiation Oncology Follow up Note  Name: Wanda Martinez   Date:   05/02/2017 MRN:  [id number] DOB: 1935-07-17    This 82 y.o. female presents to the clinic today for over for your follow-up status post right modified radical mastectomy and adjuvant radiation therapy to her chest and peripheral lymphatics for ER/PR positive PR borderline HER-2/neu negative invasive mammary carcinoma.  REFERRING PROVIDER: Einar Pheasant, MD  HPI: Patient is a 82 year old female now out over 4 years having completed chest wall peripheral hepatic radiation for 1.1 cm invasive mammary carcinoma ER positive PR borderline HER-2/neu not overexpressed. She had adjuvant chemotherapy plus chest wall peripheral lymphatic radiation. She's had a stable mastectomy scar which is fibrotic in the right chest wall previous CT scan show this to be normal scarring. She specifically denies any new nodularity in the chest wall any swelling of her upper extremities cough or bone pain..  COMPLICATIONS OF TREATMENT: none  FOLLOW UP COMPLIANCE: keeps appointments   PHYSICAL EXAM:  BP 130/75   Pulse 68   Temp 98.2 F (36.8 C)   Resp 18   Wt 184 lb 4.9 oz (83.6 kg)   LMP 12/18/1981   BMI 31.64 kg/m  Patient is status post partial mastectomy the left chest wall has no evidence of mass or nodularity. She status post right modified radical mastectomy there is still persistent scarring in a ridge like manner which is stable over time no other mass or nodularity in the chest wall is noted. No axillary or supraclavicular adenopathy is identified. No evidence of lymphedema in either upper extremity is noted. Well-developed well-nourished patient in NAD. HEENT reveals PERLA, EOMI, discs not visualized.  Oral cavity is clear. No oral mucosal lesions are identified. Neck is clear without evidence of cervical or supraclavicular adenopathy. Lungs are clear to A&P. Cardiac examination is essentially unremarkable with regular rate and rhythm  without murmur rub or thrill. Abdomen is benign with no organomegaly or masses noted. Motor sensory and DTR levels are equal and symmetric in the upper and lower extremities. Cranial nerves II through XII are grossly intact. Proprioception is intact. No peripheral adenopathy or edema is identified. No motor or sensory levels are noted. Crude visual fields are within normal range.  RADIOLOGY RESULTS: No current films for review  PLAN: At the present time I'm going to discontinue follow-up care. Patient has back issues making transportation difficult. She continues close follow-up care with her PMD as well as medical oncology. I would be happy to reevaluate the patient any time should further radiation therapy be indicated.  I would like to take this opportunity to thank you for allowing me to participate in the care of your patient.Armstead Peaks., MD

## 2017-05-02 NOTE — Telephone Encounter (Signed)
Order placed for lumbar MRI.  Someone should be contacting her with appt date and time.

## 2017-05-03 ENCOUNTER — Other Ambulatory Visit (INDEPENDENT_AMBULATORY_CARE_PROVIDER_SITE_OTHER): Payer: Medicare Other

## 2017-05-03 DIAGNOSIS — I10 Essential (primary) hypertension: Secondary | ICD-10-CM | POA: Diagnosis not present

## 2017-05-03 DIAGNOSIS — E7800 Pure hypercholesterolemia, unspecified: Secondary | ICD-10-CM

## 2017-05-03 LAB — BASIC METABOLIC PANEL
BUN: 17 mg/dL (ref 6–23)
CALCIUM: 9.8 mg/dL (ref 8.4–10.5)
CO2: 31 mEq/L (ref 19–32)
Chloride: 99 mEq/L (ref 96–112)
Creatinine, Ser: 0.98 mg/dL (ref 0.40–1.20)
GFR: 58.11 mL/min — AB (ref >60.00)
Glucose, Bld: 86 mg/dL (ref 70–99)
Potassium: 4.2 mEq/L (ref 3.5–5.1)
SODIUM: 136 meq/L (ref 135–145)

## 2017-05-03 LAB — LIPID PANEL
CHOLESTEROL: 171 mg/dL (ref 0–200)
HDL: 41.1 mg/dL (ref >39.00)
LDL Cholesterol: 93 mg/dL (ref 0–99)
NONHDL: 130.11
Total CHOL/HDL Ratio: 4
Triglycerides: 186 mg/dL — ABNORMAL HIGH (ref 0.0–149.0)
VLDL: 37.2 mg/dL (ref 0.0–40.0)

## 2017-05-03 LAB — HEPATIC FUNCTION PANEL
ALBUMIN: 4.4 g/dL (ref 3.5–5.2)
ALT: 20 U/L (ref 0–35)
AST: 23 U/L (ref 0–37)
Alkaline Phosphatase: 53 U/L (ref 39–117)
Bilirubin, Direct: 0.1 mg/dL (ref 0.0–0.3)
Total Bilirubin: 0.4 mg/dL (ref 0.2–1.2)
Total Protein: 7.4 g/dL (ref 6.0–8.3)

## 2017-05-03 LAB — TSH: TSH: 3.16 u[IU]/mL (ref 0.35–4.50)

## 2017-05-17 ENCOUNTER — Ambulatory Visit
Admission: RE | Admit: 2017-05-17 | Discharge: 2017-05-17 | Disposition: A | Discharge status: Home / Self Care | Payer: Medicare Other | Source: Ambulatory Visit | Attending: Internal Medicine | Admitting: Internal Medicine

## 2017-05-17 DIAGNOSIS — M4186 Other forms of scoliosis, lumbar region: Secondary | ICD-10-CM | POA: Diagnosis not present

## 2017-05-17 DIAGNOSIS — M545 Low back pain: Secondary | ICD-10-CM | POA: Diagnosis not present

## 2017-05-17 DIAGNOSIS — M4807 Spinal stenosis, lumbosacral region: Secondary | ICD-10-CM | POA: Diagnosis not present

## 2017-05-17 DIAGNOSIS — G8929 Other chronic pain: Secondary | ICD-10-CM | POA: Insufficient documentation

## 2017-05-17 DIAGNOSIS — M48061 Spinal stenosis, lumbar region without neurogenic claudication: Secondary | ICD-10-CM | POA: Insufficient documentation

## 2017-05-17 DIAGNOSIS — M5136 Other intervertebral disc degeneration, lumbar region: Secondary | ICD-10-CM | POA: Diagnosis not present

## 2017-05-25 ENCOUNTER — Other Ambulatory Visit: Payer: Self-pay | Admitting: Internal Medicine

## 2017-06-13 DIAGNOSIS — M25551 Pain in right hip: Secondary | ICD-10-CM | POA: Diagnosis not present

## 2017-06-13 DIAGNOSIS — M4120 Other idiopathic scoliosis, site unspecified: Secondary | ICD-10-CM | POA: Diagnosis not present

## 2017-06-13 DIAGNOSIS — M5416 Radiculopathy, lumbar region: Secondary | ICD-10-CM | POA: Diagnosis not present

## 2017-06-13 DIAGNOSIS — M8580 Other specified disorders of bone density and structure, unspecified site: Secondary | ICD-10-CM | POA: Diagnosis not present

## 2017-06-13 DIAGNOSIS — M545 Low back pain: Secondary | ICD-10-CM | POA: Diagnosis not present

## 2017-06-25 ENCOUNTER — Ambulatory Visit
Admission: RE | Admit: 2017-06-25 | Discharge: 2017-06-25 | Disposition: A | Discharge status: Home / Self Care | Payer: Medicare Other | Source: Ambulatory Visit | Attending: Oncology | Admitting: Oncology

## 2017-06-25 DIAGNOSIS — M8588 Other specified disorders of bone density and structure, other site: Secondary | ICD-10-CM | POA: Insufficient documentation

## 2017-06-25 DIAGNOSIS — M85859 Other specified disorders of bone density and structure, unspecified thigh: Secondary | ICD-10-CM

## 2017-06-25 DIAGNOSIS — M85852 Other specified disorders of bone density and structure, left thigh: Secondary | ICD-10-CM | POA: Diagnosis not present

## 2017-06-25 DIAGNOSIS — Z780 Asymptomatic menopausal state: Secondary | ICD-10-CM | POA: Diagnosis not present

## 2017-06-25 DIAGNOSIS — M4186 Other forms of scoliosis, lumbar region: Secondary | ICD-10-CM | POA: Diagnosis not present

## 2017-06-26 ENCOUNTER — Other Ambulatory Visit: Payer: Self-pay | Admitting: Nurse Practitioner

## 2017-06-26 ENCOUNTER — Other Ambulatory Visit: Payer: Self-pay | Admitting: Internal Medicine

## 2017-06-26 ENCOUNTER — Other Ambulatory Visit: Payer: Self-pay | Admitting: Oncology

## 2017-06-26 DIAGNOSIS — M7918 Myalgia, other site: Secondary | ICD-10-CM

## 2017-06-26 DIAGNOSIS — M150 Primary generalized (osteo)arthritis: Principal | ICD-10-CM

## 2017-06-26 DIAGNOSIS — M159 Polyosteoarthritis, unspecified: Secondary | ICD-10-CM

## 2017-07-02 ENCOUNTER — Encounter: Payer: Self-pay | Admitting: Nurse Practitioner

## 2017-07-02 ENCOUNTER — Ambulatory Visit: Payer: Medicare Other | Attending: Nurse Practitioner | Admitting: Nurse Practitioner

## 2017-07-02 VITALS — BP 135/73 | HR 63 | Temp 98.1°F | Resp 16 | Ht 64.0 in | Wt 179.0 lb

## 2017-07-02 DIAGNOSIS — Z79891 Long term (current) use of opiate analgesic: Secondary | ICD-10-CM

## 2017-07-02 DIAGNOSIS — N182 Chronic kidney disease, stage 2 (mild): Secondary | ICD-10-CM | POA: Diagnosis not present

## 2017-07-02 DIAGNOSIS — Z833 Family history of diabetes mellitus: Secondary | ICD-10-CM | POA: Diagnosis not present

## 2017-07-02 DIAGNOSIS — M150 Primary generalized (osteo)arthritis: Secondary | ICD-10-CM | POA: Diagnosis not present

## 2017-07-02 DIAGNOSIS — Z7982 Long term (current) use of aspirin: Secondary | ICD-10-CM | POA: Diagnosis not present

## 2017-07-02 DIAGNOSIS — M7918 Myalgia, other site: Secondary | ICD-10-CM

## 2017-07-02 DIAGNOSIS — M47816 Spondylosis without myelopathy or radiculopathy, lumbar region: Secondary | ICD-10-CM | POA: Diagnosis not present

## 2017-07-02 DIAGNOSIS — K219 Gastro-esophageal reflux disease without esophagitis: Secondary | ICD-10-CM | POA: Diagnosis not present

## 2017-07-02 DIAGNOSIS — M792 Neuralgia and neuritis, unspecified: Secondary | ICD-10-CM

## 2017-07-02 DIAGNOSIS — E669 Obesity, unspecified: Secondary | ICD-10-CM | POA: Diagnosis not present

## 2017-07-02 DIAGNOSIS — M4696 Unspecified inflammatory spondylopathy, lumbar region: Secondary | ICD-10-CM

## 2017-07-02 DIAGNOSIS — M791 Myalgia: Secondary | ICD-10-CM

## 2017-07-02 DIAGNOSIS — Z8371 Family history of colonic polyps: Secondary | ICD-10-CM | POA: Insufficient documentation

## 2017-07-02 DIAGNOSIS — Z9889 Other specified postprocedural states: Secondary | ICD-10-CM | POA: Diagnosis not present

## 2017-07-02 DIAGNOSIS — Z9010 Acquired absence of unspecified breast and nipple: Secondary | ICD-10-CM | POA: Diagnosis not present

## 2017-07-02 DIAGNOSIS — Z87440 Personal history of urinary (tract) infections: Secondary | ICD-10-CM | POA: Insufficient documentation

## 2017-07-02 DIAGNOSIS — Z888 Allergy status to other drugs, medicaments and biological substances status: Secondary | ICD-10-CM | POA: Diagnosis not present

## 2017-07-02 DIAGNOSIS — E785 Hyperlipidemia, unspecified: Secondary | ICD-10-CM | POA: Diagnosis not present

## 2017-07-02 DIAGNOSIS — M159 Polyosteoarthritis, unspecified: Secondary | ICD-10-CM

## 2017-07-02 DIAGNOSIS — G20 Parkinson's disease: Secondary | ICD-10-CM | POA: Insufficient documentation

## 2017-07-02 DIAGNOSIS — G894 Chronic pain syndrome: Secondary | ICD-10-CM | POA: Diagnosis not present

## 2017-07-02 DIAGNOSIS — Z853 Personal history of malignant neoplasm of breast: Secondary | ICD-10-CM | POA: Diagnosis not present

## 2017-07-02 DIAGNOSIS — I129 Hypertensive chronic kidney disease with stage 1 through stage 4 chronic kidney disease, or unspecified chronic kidney disease: Secondary | ICD-10-CM | POA: Insufficient documentation

## 2017-07-02 DIAGNOSIS — Z90710 Acquired absence of both cervix and uterus: Secondary | ICD-10-CM | POA: Diagnosis not present

## 2017-07-02 DIAGNOSIS — Z6830 Body mass index (BMI) 30.0-30.9, adult: Secondary | ICD-10-CM | POA: Insufficient documentation

## 2017-07-02 DIAGNOSIS — Z823 Family history of stroke: Secondary | ICD-10-CM | POA: Diagnosis not present

## 2017-07-02 DIAGNOSIS — Z820 Family history of epilepsy and other diseases of the nervous system: Secondary | ICD-10-CM | POA: Insufficient documentation

## 2017-07-02 MED ORDER — GABAPENTIN 300 MG PO CAPS: 300.0000 mg | ORAL_CAPSULE | Freq: Two times a day (BID) | ORAL | 0 refills | Status: DC

## 2017-07-02 MED ORDER — HYDROCODONE-ACETAMINOPHEN 5-325 MG PO TABS: 1.0000 | ORAL_TABLET | Freq: Three times a day (TID) | ORAL | 0 refills | Status: DC | PRN

## 2017-07-02 MED ORDER — CYCLOBENZAPRINE HCL 10 MG PO TABS: 10.0000 mg | ORAL_TABLET | Freq: Every day | ORAL | 0 refills | Status: DC

## 2017-07-02 MED ORDER — MELOXICAM 7.5 MG PO TABS: 7.5000 mg | ORAL_TABLET | Freq: Every day | ORAL | 0 refills | Status: DC

## 2017-07-02 MED ORDER — MAGNESIUM OXIDE 400 (241.3 MG) MG PO TABS
1.0000 | ORAL_TABLET | Freq: Every day | ORAL | 0 refills | Status: DC
Start: 2017-07-28 — End: 2017-10-03

## 2017-07-02 NOTE — Patient Instructions (Addendum)
____________________________________________________________________________________________  Medication Rules  Applies to: All patients receiving prescriptions (written or electronic).  Pharmacy of record: Pharmacy where electronic prescriptions will be sent. If written prescriptions are taken to a different pharmacy, please inform the nursing staff. The pharmacy listed in the electronic medical record should be the one where you would like electronic prescriptions to be sent.  Prescription refills: Only during scheduled appointments. Applies to both, written and electronic prescriptions.  NOTE: The following applies primarily to controlled substances (Opioid* Pain Medications).   Patient's responsibilities: 1. Pain Pills: Bring all pain pills to every appointment (except for procedure appointments). 2. Pill Bottles: Bring pills in original pharmacy bottle. Always bring newest bottle. Bring bottle, even if empty. 3. Medication refills: You are responsible for knowing and keeping track of what medications you need refilled. The day before your appointment, write a list of all prescriptions that need to be refilled. Bring that list to your appointment and give it to the admitting nurse. Prescriptions will be written only during appointments. If you forget a medication, it will not be "Called in", "Faxed", or "electronically sent". You will need to get another appointment to get these prescribed. 4. Prescription Accuracy: You are responsible for carefully inspecting your prescriptions before leaving our office. Have the discharge nurse carefully go over each prescription with you, before taking them home. Make sure that your name is accurately spelled, that your address is correct. Check the name and dose of your medication to make sure it is accurate. Check the number of pills, and the written instructions to make sure they are clear and accurate. Make sure that you are given enough medication to  last until your next medication refill appointment. 5. Taking Medication: Take medication as prescribed. Never take more pills than instructed. Never take medication more frequently than prescribed. Taking less pills or less frequently is permitted and encouraged, when it comes to controlled substances (written prescriptions).  6. Inform other Doctors: Always inform, all of your healthcare providers, of all the medications you take. 7. Pain Medication from other Providers: You are not allowed to accept any additional pain medication from any other Doctor or Healthcare provider. There are two exceptions to this rule. (see below) In the event that you require additional pain medication, you are responsible for notifying us, as stated below. 8. Medication Agreement: You are responsible for carefully reading and following our Medication Agreement. This must be signed before receiving any prescriptions from our practice. Safely store a copy of your signed Agreement. Violations to the Agreement will result in no further prescriptions. (Additional copies of our Medication Agreement are available upon request.) 9. Laws, Rules, & Regulations: All patients are expected to follow all Federal and State Laws, Statutes, Rules, & Regulations. Ignorance of the Laws does not constitute a valid excuse. The use of any illegal substances is prohibited. 10. Adopted CDC guidelines & recommendations: Target dosing levels will be at or below 60 MME/day. Use of benzodiazepines** is not recommended.  Exceptions: There are only two exceptions to the rule of not receiving pain medications from other Healthcare Providers. 1. Exception #1 (Emergencies): In the event of an emergency (i.e.: accident requiring emergency care), you are allowed to receive additional pain medication. However, you are responsible for: As soon as you are able, call our office [phone], at any time of the day or night, and leave a message stating your  name, the date and nature of the emergency, and the name and dose of the medication   prescribed. In the event that your call is answered by a member of our staff, make sure to document and save the date, time, and the name of the person that took your information.  2. Exception #2 (Planned Surgery): In the event that you are scheduled by another doctor or dentist to have any type of surgery or procedure, you are allowed (for a period no longer than 30 days), to receive additional pain medication, for the acute post-op pain. However, in this case, you are responsible for picking up a copy of our "Post-op Pain Management for Surgeons" handout, and giving it to your surgeon or dentist. This document is available at our office, and does not require an appointment to obtain it. Simply go to our office during business hours (Monday-Thursday from 8:00 AM to 4:00 PM) (Friday 8:00 AM to 12:00 Noon) or if you have a scheduled appointment with [place], prior to your surgery, and ask for it by name. In addition, you will need to provide [place] with your name, name of your surgeon, type of surgery, and date of procedure or surgery.  *Opioid medications include: morphine, codeine, oxycodone, oxymorphone, hydrocodone, hydromorphone, meperidine, tramadol, tapentadol, buprenorphine, fentanyl, methadone. **Benzodiazepine medications include: diazepam (Valium), alprazolam (Xanax), clonazepam (Klonopine), lorazepam (Ativan), clorazepate (Tranxene), chlordiazepoxide (Librium), estazolam (Prosom), oxazepam (Serax), temazepam (Restoril), triazolam (Halcion)  ____________________________________________________________________________________________ Pain Management Discharge Instructions  General Discharge Instructions :  If you need to reach your doctor call: Monday-Friday 8:00 am - 4:00 pm at [phone] or toll free [phone].  After clinic hours [phone] to have operator reach doctor.  Bring all of your medication bottles  to all your appointments in the pain clinic.  To cancel or reschedule your appointment with Pain Management please remember to call 24 hours in advance to avoid a fee.  Refer to the educational materials which you have been given on: General Risks, I had my Procedure. Discharge Instructions, Post Sedation.  Post Procedure Instructions:  The drugs you were given will stay in your system until tomorrow, so for the next 24 hours you should not drive, make any legal decisions or drink any alcoholic beverages.  You may eat anything you prefer, but it is better to start with liquids then soups and crackers, and gradually work up to solid foods.  Please notify your doctor immediately if you have any unusual bleeding, trouble breathing or pain that is not related to your normal pain.  Depending on the type of procedure that was done, some parts of your body may feel week and/or numb.  This usually clears up by tonight or the next day.  Walk with the use of an assistive device or accompanied by an adult for the 24 hours.  You may use ice on the affected area for the first 24 hours.  Put ice in a Ziploc bag and cover with a towel and place against area 15 minutes on 15 minutes off.  You may switch to heat after 24 hours.GENERAL RISKS AND COMPLICATIONS  What are the risk, side effects and possible complications? Generally speaking, most procedures are safe.  However, with any procedure there are risks, side effects, and the possibility of complications.  The risks and complications are dependent upon the sites that are lesioned, or the type of nerve block to be performed.  The closer the procedure is to the spine, the more serious the risks are.  Great care is taken when placing the radio frequency needles, block needles or lesioning probes, but sometimes complications  can occur. 1. Infection: Any time there is an injection through the skin, there is a risk of infection.  This is why sterile conditions  are used for these blocks.  There are four possible types of infection. 1. Localized skin infection. 2. Central Nervous System Infection-This can be in the form of Meningitis, which can be deadly. 3. Epidural Infections-This can be in the form of an epidural abscess, which can cause pressure inside of the spine, causing compression of the spinal cord with subsequent paralysis. This would require an emergency surgery to decompress, and there are no guarantees that the patient would recover from the paralysis. 4. Discitis-This is an infection of the intervertebral discs.  It occurs in about 1% of discography procedures.  It is difficult to treat and it may lead to surgery.        2. Pain: the needles have to go through skin and soft tissues, will cause soreness.       3. Damage to internal structures:  The nerves to be lesioned may be near blood vessels or    other nerves which can be potentially damaged.       4. Bleeding: Bleeding is more common if the patient is taking blood thinners such as  aspirin, Coumadin, Ticiid, Plavix, etc., or if he/she have some genetic predisposition  such as hemophilia. Bleeding into the spinal canal can cause compression of the spinal  cord with subsequent paralysis.  This would require an emergency surgery to  decompress and there are no guarantees that the patient would recover from the  paralysis.       5. Pneumothorax:  Puncturing of a lung is a possibility, every time a needle is introduced in  the area of the chest or upper back.  Pneumothorax refers to free air around the  collapsed lung(s), inside of the thoracic cavity (chest cavity).  Another two possible  complications related to a similar event would include: Hemothorax and Chylothorax.   These are variations of the Pneumothorax, where instead of air around the collapsed  lung(s), you may have blood or chyle, respectively.       6. Spinal headaches: They may occur with any procedures in the area of the spine.        7. Persistent CSF (Cerebro-Spinal Fluid) leakage: This is a rare problem, but may occur  with prolonged intrathecal or epidural catheters either due to the formation of a fistulous  track or a dural tear.       8. Nerve damage: By working so close to the spinal cord, there is always a possibility of  nerve damage, which could be as serious as a permanent spinal cord injury with  paralysis.       9. Death:  Although rare, severe deadly allergic reactions known as "Anaphylactic  reaction" can occur to any of the medications used.      10. Worsening of the symptoms:  We can always make thing worse.  What are the chances of something like this happening? Chances of any of this occuring are extremely low.  By statistics, you have more of a chance of getting killed in a motor vehicle accident: while driving to the hospital than any of the above occurring .  Nevertheless, you should be aware that they are possibilities.  In general, it is similar to taking a shower.  Everybody knows that you can slip, hit your head and get killed.  Does that mean that you should not shower  again?  Nevertheless always keep in mind that statistics do not mean anything if you happen to be on the wrong side of them.  Even if a procedure has a 1 (one) in a 1,000,000 (million) chance of going wrong, it you happen to be that one..Also, keep in mind that by statistics, you have more of a chance of having something go wrong when taking medications.  Who should not have this procedure? If you are on a blood thinning medication (e.g. Coumadin, Plavix, see list of "Blood Thinners"), or if you have an active infection going on, you should not have the procedure.  If you are taking any blood thinners, please inform your physician.  How should I prepare for this procedure?  Do not eat or drink anything at least six hours prior to the procedure.  Bring a driver with you .  It cannot be a taxi.  Come accompanied by an adult that can  drive you back, and that is strong enough to help you if your legs get weak or numb from the local anesthetic.  Take all of your medicines the morning of the procedure with just enough water to swallow them.  If you have diabetes, make sure that you are scheduled to have your procedure done first thing in the morning, whenever possible.  If you have diabetes, take only half of your insulin dose and notify our nurse that you have done so as soon as you arrive at the clinic.  If you are diabetic, but only take blood sugar pills (oral hypoglycemic), then do not take them on the morning of your procedure.  You may take them after you have had the procedure.  Do not take aspirin or any aspirin-containing medications, at least eleven (11) days prior to the procedure.  They may prolong bleeding.  Wear loose fitting clothing that may be easy to take off and that you would not mind if it got stained with Betadine or blood.  Do not wear any jewelry or perfume  Remove any nail coloring.  It will interfere with some of our monitoring equipment.  NOTE: Remember that this is not meant to be interpreted as a complete list of all possible complications.  Unforeseen problems may occur.  BLOOD THINNERS The following drugs contain aspirin or other products, which can cause increased bleeding during surgery and should not be taken for 2 weeks prior to and 1 week after surgery.  If you should need take something for relief of minor pain, you may take acetaminophen which is found in Tylenol,m Datril, Anacin-3 and Panadol. It is not blood thinner. The products listed below are.  Do not take any of the products listed below in addition to any listed on your instruction sheet.  A.P.C or A.P.C with Codeine Codeine Phosphate Capsules #3 Ibuprofen Ridaura  ABC compound Congesprin Imuran rimadil  Advil Cope Indocin Robaxisal  Alka-Seltzer Effervescent Pain Reliever and Antacid Coricidin or Coricidin-D  Indomethacin  Rufen  Alka-Seltzer plus Cold Medicine Cosprin Ketoprofen S-A-C Tablets  Anacin Analgesic Tablets or Capsules Coumadin Korlgesic Salflex  Anacin Extra Strength Analgesic tablets or capsules CP-2 Tablets Lanoril Salicylate  Anaprox Cuprimine Capsules Levenox Salocol  Anexsia-D Dalteparin Magan Salsalate  Anodynos Darvon compound Magnesium Salicylate Sine-off  Ansaid Dasin Capsules Magsal Sodium Salicylate  Anturane Depen Capsules Marnal Soma  APF Arthritis pain formula Dewitt's Pills Measurin Stanback  Argesic Dia-Gesic Meclofenamic Sulfinpyrazone  Arthritis Bayer Timed Release Aspirin Diclofenac Meclomen Sulindac  Arthritis pain formula Anacin Dicumarol  Medipren Supac  Analgesic (Safety coated) Arthralgen Diffunasal Mefanamic Suprofen  Arthritis Strength Bufferin Dihydrocodeine Mepro Compound Suprol  Arthropan liquid Dopirydamole Methcarbomol with Aspirin Synalgos  ASA tablets/Enseals Disalcid Micrainin Tagament  Ascriptin Doan's Midol Talwin  Ascriptin A/D Dolene Mobidin Tanderil  Ascriptin Extra Strength Dolobid Moblgesic Ticlid  Ascriptin with Codeine Doloprin or Doloprin with Codeine Momentum Tolectin  Asperbuf Duoprin Mono-gesic Trendar  Aspergum Duradyne Motrin or Motrin IB Triminicin  Aspirin plain, buffered or enteric coated Durasal Myochrisine Trigesic  Aspirin Suppositories Easprin Nalfon Trillsate  Aspirin with Codeine Ecotrin Regular or Extra Strength Naprosyn Uracel  Atromid-S Efficin Naproxen Ursinus  Auranofin Capsules Elmiron Neocylate Vanquish  Axotal Emagrin Norgesic Verin  Azathioprine Empirin or Empirin with Codeine Normiflo Vitamin E  Azolid Emprazil Nuprin Voltaren  Bayer Aspirin plain, buffered or children's or timed BC Tablets or powders Encaprin Orgaran Warfarin Sodium  Buff-a-Comp Enoxaparin Orudis Zorpin  Buff-a-Comp with Codeine Equegesic Os-Cal-Gesic   Buffaprin Excedrin plain, buffered or Extra Strength Oxalid   Bufferin Arthritis Strength Feldene  Oxphenbutazone   Bufferin plain or Extra Strength Feldene Capsules Oxycodone with Aspirin   Bufferin with Codeine Fenoprofen Fenoprofen Pabalate or Pabalate-SF   Buffets II Flogesic Panagesic   Buffinol plain or Extra Strength Florinal or Florinal with Codeine Panwarfarin   Buf-Tabs Flurbiprofen Penicillamine   Butalbital Compound Four-way cold tablets Penicillin   Butazolidin Fragmin Pepto-Bismol   Carbenicillin Geminisyn Percodan   Carna Arthritis Reliever Geopen Persantine   Carprofen Gold's salt Persistin   Chloramphenicol Goody's Phenylbutazone   Chloromycetin Haltrain Piroxlcam   Clmetidine heparin Plaquenil   Cllnoril Hyco-pap Ponstel   Clofibrate Hydroxy chloroquine Propoxyphen         Before stopping any of these medications, be sure to consult the physician who ordered them.  Some, such as Coumadin (Warfarin) are ordered to prevent or treat serious conditions such as "deep thrombosis", "pumonary embolisms", and other heart problems.  The amount of time that you may need off of the medication may also vary with the medication and the reason for which you were taking it.  If you are taking any of these medications, please make sure you notify your pain physician before you undergo any procedures.          Facet Joint Block The facet joints connect the bones of the spine (vertebrae). They make it possible for you to bend, twist, and make other movements with your spine. They also keep you from bending too far, twisting too far, and making other excessive movements. A facet joint block is a procedure where a numbing medicine (anesthetic) is injected into a facet joint. Often, a type of anti-inflammatory medicine called a steroid is also injected. A facet joint block may be done to diagnose neck or back pain. If the pain gets better after a facet joint block, it means the pain is probably coming from the facet joint. If the pain does not get better, it means the pain is probably  not coming from the facet joint. A facet joint block may also be done to relieve neck or back pain caused by an inflamed facet joint. A facet joint block is only done to relieve pain if the pain does not improve with other methods, such as medicine, exercise programs, and physical therapy. Tell a health care provider about:  Any allergies you have.  All medicines you are taking, including vitamins, herbs, eye drops, creams, and over-the-counter medicines.  Any problems you or family members have had with anesthetic  medicines.  Any blood disorders you have.  Any surgeries you have had.  Any medical conditions you have.  Whether you are pregnant or may be pregnant. What are the risks? Generally, this is a safe procedure. However, problems may occur, including:  Bleeding.  Injury to a nerve near the injection site.  Pain at the injection site.  Weakness or numbness in areas controlled by nerves near the injection site.  Infection.  Temporary fluid retention.  Allergic reactions to medicines or dyes.  Injury to other structures or organs near the injection site.  What happens before the procedure?  Follow instructions from your health care provider about eating or drinking restrictions.  Ask your health care provider about: ? Changing or stopping your regular medicines. This is especially important if you are taking diabetes medicines or blood thinners. ? Taking medicines such as aspirin and ibuprofen. These medicines can thin your blood. Do not take these medicines before your procedure if your health care provider instructs you not to.  Do not take any new dietary supplements or medicines without asking your health care provider first.  Plan to have someone take you home after the procedure. What happens during the procedure?  You may need to remove your clothing and dress in an open-back gown.  The procedure will be done while you are lying on an X-ray table. You will  most likely be asked to lie on your stomach, but you may be asked to lie in a different position if an injection will be made in your neck.  Machines will be used to monitor your oxygen levels, heart rate, and blood pressure.  If an injection will be made in your neck, an IV tube will be inserted into one of your veins. Fluids and medicine will flow directly into your body through the IV tube.  The area over the facet joint where the injection will be made will be cleaned with soap. The surrounding skin will be covered with clean drapes.  A numbing medicine (local anesthetic) will be applied to your skin. Your skin may sting or burn for a moment.  A video X-ray machine (fluoroscopy) will be used to locate the joint. In some cases, a CT scan may be used.  A contrast dye may be injected into the facet joint area to help locate the joint.  When the joint is located, an anesthetic will be injected into the joint through the needle.  Your health care provider will ask you whether you feel pain relief. If you do feel relief, a steroid may be injected to provide pain relief for a longer period of time. If you do not feel relief or feel only partial relief, additional injections of an anesthetic may be made in other facet joints.  The needle will be removed.  Your skin will be cleaned.  A bandage (dressing) will be applied over each injection site. The procedure may vary among health care providers and hospitals. What happens after the procedure?  You will be observed for 15-30 minutes before being allowed to go home. This information is not intended to replace advice given to you by your health care provider. Make sure you discuss any questions you have with your health care provider. Document Released: 04/25/2007 Document Revised: 01/05/2016 Document Reviewed: 08/30/2015 Elsevier Interactive Patient Education  [employer].

## 2017-07-02 NOTE — Progress Notes (Signed)
Nursing Pain Medication Assessment:  Safety precautions to be maintained throughout the outpatient stay will include: orient to surroundings, keep bed in low position, maintain call bell within reach at all times, provide assistance with transfer out of bed and ambulation.  Medication Inspection Compliance: Pill count conducted under aseptic conditions, in front of the patient. Neither the pills nor the bottle was removed from the patient's sight at any time. Once count was completed pills were immediately returned to the patient in their original bottle.  Medication: Hydrocodone/APAP Pill/Patch Count: 74 of 90 pills remain Pill/Patch Appearance: Markings consistent with prescribed medication Bottle Appearance: Standard pharmacy container. Clearly labeled. Filled Date: 07 / 14 / 2018 Last Medication intake:  Today

## 2017-07-02 NOTE — Progress Notes (Signed)
Patient's Name: Wanda Martinez  MRN: [id number]  Referring Provider: Dale Glenwood, MD  DOB: [birth year]  PCP: Dale Wadsworth, MD  DOS: 07/02/2017  Note by: Barbette Merino NP  Service setting: Ambulatory outpatient  Specialty: Interventional Pain Management  Location: [facility] (AMB) Pain Management Facility    Patient type: Established    Primary Reason(s) for Visit: Encounter for prescription drug management. (Level of risk: moderate)  CC: Hip Pain (bilateral)  HPI  Wanda Martinez is a 82 y.o. year old, female patient, who comes today for a medication management evaluation. She has Hypercholesterolemia; Essential hypertension; Frequent UTI; Primary cancer of upper inner quadrant of right female breast (HCC); Tremor; Toenail fungus; Headache; Obesity (BMI 30-39.9); Dysuria; Parkinsons (HCC); Fatigue; Chronic kidney disease (CKD), stage II (mild); Clinical depression; Incomplete bladder emptying; Mixed incontinence; HLD (hyperlipidemia); Bladder infection, chronic; Parkinson's disease (HCC); Pure hypercholesterolemia; Hernia, rectovaginal; Long term current use of opiate analgesic; Long term prescription opiate use; Opiate use; Encounter for therapeutic drug level monitoring; Encounter for chronic pain management; Chronic low back pain (Location of Primary Source of Pain) (Bilateral) (R>L); Lumbar spondylosis; Chronic hip pain; Chronic neck pain; Cervical spondylosis; Chronic cervical radicular pain (Right); Chronic upper extremity pain (Right); Osteoarthritis; Diffuse myofascial pain syndrome; Neurogenic pain; Chronic upper back pain (Right); Myofascial pain syndrome (Right) (cervicothoracic); Vaginitis; Lumbar facet syndrome (Location of Primary Source of Pain) (Bilateral) (R>L); Malignant neoplasm of right female breast (HCC); Chronic constipation; Cervical facet hypertrophy; Cervical facet syndrome (HCC); Chronic shoulder pain (Right); and Chronic pain syndrome on her problem list. Her primarily concern  today is the Hip Pain (bilateral)  Pain Assessment: Location: Left, Right Hip Radiating: right leg Onset: More than a month ago Duration: Chronic pain Quality: Aching, Throbbing Severity: 8 /10 (self-reported pain score)  Note: Reported level is compatible with observation.                   Effect on ADL:   Timing: Constant Modifying factors: ,medications, rest and postioning, heating pad  Wanda Martinez was last scheduled for an appointment on 04/03/2017 for medication management. During today's appointment we reviewed Wanda Martinez's chronic pain status, as well as her outpatient medication regimen. She admits that her pain in her back and buttocks is getting worse. She has radicular pain into her right foot.. She has been seen by Dr Edythe Lynn in [place]. She states that the LESI is not effective for long enough. She states that she is not sure how long she can continue in pain. She states that this affecting her quality of life. She gets spasms in her lower back after standing for long periods that "stops her in her tracks".   The patient  reports that she does not use drugs. Her body mass index is 30.73 kg/m.  Further details on both, my assessment(s), as well as the proposed treatment plan, please see below.  Controlled Substance Pharmacotherapy Assessment REMS (Risk Evaluation and Mitigation Strategy)  Analgesic:Hydrocodone/APAP 5/325 one tablet every 8 hours (15 mg/day) MME/day:15 mg/day. Rickey Barbara, RN  07/02/2017 11:06 AM  Sign at close encounter Nursing Pain Medication Assessment:  Safety precautions to be maintained throughout the outpatient stay will include: orient to surroundings, keep bed in low position, maintain call bell within reach at all times, provide assistance with transfer out of bed and ambulation.  Medication Inspection Compliance: Pill count conducted under aseptic conditions, in front of the patient. Neither the pills nor the bottle was removed from the  patient's sight  at any time. Once count was completed pills were immediately returned to the patient in their original bottle.  Medication: Hydrocodone/APAP Pill/Patch Count: 74 of 90 pills remain Pill/Patch Appearance: Markings consistent with prescribed medication Bottle Appearance: Standard pharmacy container. Clearly labeled. Filled Date: 07 / 14 / 2018 Last Medication intake:  Today   Pharmacokinetics: Liberation and absorption (onset of action): WNL Distribution (time to peak effect): WNL Metabolism and excretion (duration of action): WNL         Pharmacodynamics: Desired effects: Analgesia: Wanda Martinez reports >50% benefit. Functional ability: Patient reports that medication allows her to accomplish basic ADLs Clinically meaningful improvement in function (CMIF): Sustained CMIF goals met Perceived effectiveness: Described as relatively effective, allowing for increase in activities of daily living (ADL) Undesirable effects: Side-effects or Adverse reactions: None reported Monitoring: [place] PMP: Online review of the past 32-monthperiod conducted. Compliant with practice rules and regulations List of all UDS test(s) done:  Lab Results  Component Value Date   TOXASSSELUR FINAL 03/27/2016   [place] 12[place]2015   Last UDS on record: ToxAssure Select 13  Date Value Ref Range Status  03/27/2016 FINAL  Final    Comment:    ==================================================================== TOXASSURE SELECT 13 (MW) ==================================================================== Test                             Result       Flag       Units Drug Present and Declared for Prescription Verification   Hydrocodone                    644          EXPECTED   ng/mg creat   Norhydrocodone                 707          EXPECTED   ng/mg creat    Sources of hydrocodone include scheduled prescription    medications. Norhydrocodone is an expected metabolite of     hydrocodone. ==================================================================== Test                      Result    Flag   Units      Ref Range   Creatinine              68               mg/dL      >=20 ==================================================================== Declared Medications:  The flagging and interpretation on this report are based on the  following declared medications.  Unexpected results may arise from  inaccuracies in the declared medications.  **Note: The testing scope of this panel includes these medications:  Hydrocodone (Norco)  Hydrocodone (Vicodin)  **Note: The testing scope of this panel does not include following  reported medications:  Acetaminophen (Norco)  Acetaminophen (Vicodin)  Amlodipine (Norvasc)  Aspirin  Carbidopa (Sinemet)  Citalopram (Celexa)  Cyclobenzaprine (Flexeril)  Gabapentin (Neurontin)  Hydrochlorothiazide (Hydrodiuril)  Letrozole (Femara)  Levodopa (Sinemet)  Losartan (Cozaar)  Magnesium (Mag-Ox)  Meloxicam (Mobic)  Metoprolol (Toprol)  Multivitamin  Omeprazole (Prilosec)  Pravastatin (Pravachol)  Supplement (OsCal)  Trazodone (Desyrel) ==================================================================== For clinical consultation, please call [phone] ====================================================================    UDS interpretation: Compliant          Medication Assessment Form: Reviewed. Patient indicates being compliant with therapy Treatment compliance: Compliant Risk Assessment Profile: Aberrant behavior: See prior evaluations.  None observed or detected today Comorbid factors increasing risk of overdose: See prior notes. No additional risks detected today Risk of substance use disorder (SUD): Low Opioid Risk Tool (ORT) Total Score: 0  Interpretation Table:  Score <3 = Low Risk for SUD  Score between 4-7 = Moderate Risk for SUD  Score >8 = High Risk for Opioid Abuse   Risk Mitigation  Strategies:  Patient Counseling: Covered Patient-Prescriber Agreement (PPA): Present and active  Notification to other healthcare providers: Done  Pharmacologic Plan: No change in therapy, at this time  Laboratory Chemistry  Inflammation Markers (CRP: Acute Phase) (ESR: Chronic Phase) No results found for: CRP, ESRSEDRATE               Renal Function Markers Lab Results  Component Value Date   BUN 17 05/03/2017   CREATININE 0.98 05/03/2017   GFRAA >60 10/08/2014   GFRNONAA >60 10/08/2014                 Hepatic Function Markers Lab Results  Component Value Date   AST 23 05/03/2017   ALT 20 05/03/2017   ALBUMIN 4.4 05/03/2017   ALKPHOS 53 05/03/2017                 Electrolytes Lab Results  Component Value Date   NA 136 05/03/2017   K 4.2 05/03/2017   CL 99 05/03/2017   CALCIUM 9.8 05/03/2017   MG 1.8 04/21/2013                 Neuropathy Markers Lab Results  Component Value Date   WUXLKGMW10 272 02/01/2016                 Bone Pathology Markers Lab Results  Component Value Date   ALKPHOS 53 05/03/2017   CALCIUM 9.8 05/03/2017                 Coagulation Parameters Lab Results  Component Value Date   PLT 244.0 10/18/2016                 Cardiovascular Markers Lab Results  Component Value Date   HGB 12.3 10/18/2016   HCT 36.6 10/18/2016                 Note: Lab results reviewed.  Recent Diagnostic Imaging Review  Dg Bone Density  Result Date: 06/25/2017 EXAM: DUAL X-RAY ABSORPTIOMETRY (DXA) FOR BONE MINERAL DENSITY IMPRESSION: Dear Dr. Delight Hoh, Your patient [name] completed a BMD test on 06/25/2017 using the [employer] (analysis version: 14.10) manufactured by [employer]. The following summarizes the results of our evaluation. PATIENT BIOGRAPHICAL: Name: Luane, Rochon Patient ID: [id number] Birth Date: 10/07/1935 Height: 64.0 in. Gender: Female Exam Date: 06/25/2017 Weight: 184.3 lbs. Indications: Advanced Age, Breast  CA, Caucasian, Height Loss, Hysterectomy, Osteoarthritis, Postmenopausal Fractures: Treatments: Calcium, letrozole, Vitamin D ASSESSMENT: The BMD measured at Femur Neck Left is 0.718 g/cm2 with a T-score of -2.3. This patient is considered OSTEOPENIC according to [facility]) criteria. Lumbar spine was not utilized due to advanced degenerative changes and scoliosis. Site Region Measured Measured WHO Young Adult BMD Date       Age      Classification T-score DualFemur Neck Left 06/25/[age] 79.6 Osteopenia -2.3 0.718 g/cm2 DualFemur Neck Left 06/21/2016 78.6 Osteopenia -2.3 0.712 g/cm2 DualFemur Neck Left 06/15/2015 77.6 Osteopenia -1.8 0.788 g/cm2 DualFemur Neck Left 08/21/2013 75.8 Osteopenia -2.0 0.764 g/cm2 Left Forearm Radius 33% 06/25/2017 79.6  Osteopenia -2.1 0.695 g/cm2 Left Forearm Radius 33% 06/21/2016 78.6 Osteopenia -1.9 0.712 g/cm2 Left Forearm Radius 33% 06/15/2015 77.6 Osteoporosis -2.7 0.638 g/cm2 Left Forearm Radius 33% 08/21/2013 75.8 Osteopenia -1.7 0.724 g/cm2 World Health Organization [facility]) criteria for post-menopausal, Caucasian Women: Normal:       T-score at or above -1 SD Osteopenia:   T-score between -1 and -2.5 SD Osteoporosis: T-score at or below -2.5 SD RECOMMENDATIONS: [place] recommends that FDA-approved medical therapies be considered in postmenopausal women and men age 52 or older with a: 1. Hip or vertebral (clinical or morphometric) fracture. 2. T-score of < -2.5 at the spine or hip. 3. Ten-year fracture probability by FRAX of 3% or greater for hip fracture or 20% or greater for major osteoporotic fracture. All treatment decisions require clinical judgment and consideration of individual patient factors, including patient preferences, co-morbidities, previous drug use, risk factors not captured in the FRAX model (e.g. falls, vitamin D deficiency, increased bone turnover, interval significant decline in bone density) and possible under - or  over-estimation of fracture risk by FRAX. All patients should ensure an adequate intake of dietary calcium (1200 mg/d) and vitamin D (800 IU daily) unless contraindicated. FOLLOW-UP: People with diagnosed cases of osteoporosis or at high risk for fracture should have regular bone mineral density tests. For patients eligible for Medicare, routine testing is allowed once every 2 years. The testing frequency can be increased to one year for patients who have rapidly progressing disease, those who are receiving or discontinuing medical therapy to restore bone mass, or have additional risk factors. I have reviewed this report, and agree with the above findings. Mark A. Thornton Papas, M.D. [facility] Electronically Signed   By: Lavonia Dana M.D.   On: 06/25/2017 14:12   Note: Imaging results reviewed.          Meds   Current Meds  Medication Sig  . amLODipine (NORVASC) 2.5 MG tablet TAKE 1 TABLET BY MOUTH ONCE DAILY.  Marland Kitchen aspirin 81 MG EC tablet Take 81 mg by mouth daily as needed.    . Calcium Carbonate (CALCIUM 600) 1500 MG TABS Take 1 tablet by mouth daily.    . carbidopa-levodopa (SINEMET IR) 25-100 MG tablet Take by mouth.  . citalopram (CELEXA) 20 MG tablet TAKE 1 & 1/2 TABLETS BY MOUTH ONCE DAILY  . GLUCOSAMINE SULFATE PO Take by mouth.  . hydrochlorothiazide (HYDRODIURIL) 25 MG tablet TAKE 1 TABLET BY MOUTH ONCE DAILY. (PATIENT TAKE 1/2 ONCE DAILY AND EXTRA 1/2 IF NEEDED)  . letrozole (FEMARA) 2.5 MG tablet TAKE 1 TABLET BY MOUTH ONCE DAILY.  Marland Kitchen losartan (COZAAR) 100 MG tablet TAKE 1 TABLET BY MOUTH ONCE DAILY.  Marland Kitchen lubiprostone (AMITIZA) 24 MCG capsule TAKE 1 CAPSULE BY MOUTH ONCE DAILY WITH BREAKFAST.  . metoprolol succinate (TOPROL-XL) 25 MG 24 hr tablet TAKE 1 TABLET BY MOUTH TWICE DAILY  . Multiple Vitamin (MULTIVITAMIN) tablet Take 1 tablet by mouth daily.  . Omega-3 1000 MG CAPS Take by mouth.  Marland Kitchen omeprazole (PRILOSEC) 20 MG capsule TAKE 1 CAPSULE BY MOUTH TWICE DAILY FOR REFLUX.  .  pravastatin (PRAVACHOL) 20 MG tablet TAKE 1 TABLET BY MOUTH ONCE DAILY.  . [DISCONTINUED] cyclobenzaprine (FLEXERIL) 10 MG tablet Take 1 tablet (10 mg total) by mouth at bedtime.  . [DISCONTINUED] gabapentin (NEURONTIN) 300 MG capsule Take 1 capsule (300 mg total) by mouth 2 (two) times daily.  . [DISCONTINUED] HYDROcodone-acetaminophen (NORCO/VICODIN) 5-325 MG tablet Take 1 tablet by mouth every 8 (eight)  hours as needed for severe pain.  . [DISCONTINUED] magnesium oxide (MAGNESIUM-OXIDE) 400 (241.3 Mg) MG tablet Take 1 tablet (400 mg total) by mouth daily.  . [DISCONTINUED] meloxicam (MOBIC) 7.5 MG tablet Take 1 tablet (7.5 mg total) by mouth daily.    ROS  Constitutional: Denies any fever or chills Gastrointestinal: No reported hemesis, hematochezia, vomiting, or acute GI distress Musculoskeletal: Denies any acute onset joint swelling, redness, loss of ROM, or weakness Neurological: No reported episodes of acute onset apraxia, aphasia, dysarthria, agnosia, amnesia, paralysis, loss of coordination, or loss of consciousness  Allergies  Ms. Limbert is allergic to vesicare [solifenacin].  [place]  Drug: Ms. Ebanks  reports that she does not use drugs. Alcohol:  reports that she does not drink alcohol. Tobacco:  reports that she has never smoked. She has never used smokeless tobacco. Medical:  has a past medical history of Anemia; Arm pain (07/26/2015); Arthritis; Arthritis, degenerative (03/26/2014); Back pain (11/01/2013); Breast cancer ([place]); Breast cancer ([place]); CHEST PAIN (04/29/2010); Chronic cystitis; Cystocele (02/22/2013); Cystocele, midline (08/19/2013); Degeneration of intervertebral disc of lumbosacral region (03/26/2014); DYSPNEA (04/29/2010); Enthesopathy of hip (03/26/2014); GERD (gastroesophageal reflux disease); Hiatal hernia; HTN (hypertension); LBP (low back pain) (03/26/2014); Neck pain (11/01/2013); Parkinson disease ([place]); Sinusitis (02/07/2015); Skin lesions (07/12/2014); and Urinary  incontinence. Surgical: Ms. Greenspan  has a past surgical history that includes Breast biopsy (2013); Tonsillectomy and adenoidectomy (79); Vesicovaginal fistula closure w/ TAH (1983); Breast surgery (1986); Breast enhancement surgery (1987); Breast implant removal; Breast implant removal (Right, 08/29/2012); Mastectomy (08/2012); Abdominal hysterectomy; and Colonoscopy with propofol (N/A, 09/13/2016). Family: family history includes Colon polyps in her father; Diabetes in her father; Parkinson's disease in her mother; Stroke in her father and mother.  Constitutional Exam  General appearance: Well nourished, well developed, and well hydrated. In no apparent acute distress Vitals:   07/02/17 1106  BP: 135/73  Pulse: 63  Resp: 16  Temp: 98.1 F (36.7 C)  TempSrc: Oral  SpO2: 100%  Weight: 179 lb (81.2 kg)  Height: '5\' 4"'$  (1.626 m)   BMI Assessment: Estimated body mass index is 30.73 kg/m as calculated from the following:   Height as of this encounter: '5\' 4"'$  (1.626 m).   Weight as of this encounter: 179 lb (81.2 kg).  BMI interpretation table: BMI level Category Range association with higher incidence of chronic pain  <18 kg/m2 Underweight   18.5-24.9 kg/m2 Ideal body weight   25-29.9 kg/m2 Overweight Increased incidence by 20%  30-34.9 kg/m2 Obese (Class I) Increased incidence by 68%  35-39.9 kg/m2 Severe obesity (Class II) Increased incidence by 136%  >40 kg/m2 Extreme obesity (Class III) Increased incidence by 254%   BMI Readings from Last 4 Encounters:  07/02/17 30.73 kg/m  05/02/17 31.64 kg/m  04/19/17 31.79 kg/m  04/09/17 31.72 kg/m   Wt Readings from Last 4 Encounters:  07/02/17 179 lb (81.2 kg)  05/02/17 184 lb 4.9 oz (83.6 kg)  04/19/17 185 lb 3.2 oz (84 kg)  04/09/17 184 lb 12.8 oz (83.8 kg)  Psych/Mental status: Alert, oriented x 3 (person, place, & time)       Eyes: PERLA Respiratory: No evidence of acute respiratory distress  Cervical Spine Exam  Inspection: No  masses, redness, or swelling Alignment: Symmetrical Functional ROM: Unrestricted ROM      Stability: No instability detected Muscle strength & Tone: Functionally intact Sensory: Unimpaired Palpation: No palpable anomalies              Upper Extremity (UE) Exam  Side: Right upper extremity  Side: Left upper extremity  Inspection: No masses, redness, swelling, or asymmetry. No contractures  Inspection: No masses, redness, swelling, or asymmetry. No contractures  Functional ROM: Unrestricted ROM          Functional ROM: Unrestricted ROM          Muscle strength & Tone: Functionally intact  Muscle strength & Tone: Functionally intact  Sensory: Unimpaired  Sensory: Unimpaired  Palpation: No palpable anomalies              Palpation: No palpable anomalies              Specialized Test(s): Deferred         Specialized Test(s): Deferred          Thoracic Spine Exam  Inspection: No masses, redness, or swelling Alignment: Symmetrical Functional ROM: Unrestricted ROM Stability: No instability detected Sensory: Unimpaired Muscle strength & Tone: No palpable anomalies  Lumbar Spine Exam  Inspection: No masses, redness, or swelling Alignment: Symmetrical Functional ROM: Unrestricted ROM      Stability: No instability detected Muscle strength & Tone: Functionally intact Sensory: Unimpaired Palpation: Complains of area being tender to palpation       Provocative Tests: Lumbar Hyperextension and rotation test: Positive bilaterally for facet joint pain. Patrick's Maneuver: evaluation deferred today                    Gait & Posture Assessment  Ambulation: Unassisted Gait: Relatively normal for age and body habitus Posture: WNL   Lower Extremity Exam    Side: Right lower extremity  Side: Left lower extremity  Inspection: No masses, redness, swelling, or asymmetry. No contractures  Inspection: No masses, redness, swelling, or asymmetry. No contractures  Functional ROM: Unrestricted ROM           Functional ROM: Unrestricted ROM          Muscle strength & Tone: Functionally intact  Muscle strength & Tone: Functionally intact  Sensory: Unimpaired  Sensory: Unimpaired  Palpation: No palpable anomalies  Palpation: No palpable anomalies   Assessment  Primary Diagnosis & Pertinent Problem List: The primary encounter diagnosis was Lumbar spondylosis. Diagnoses of Lumbar facet syndrome (Location of Primary Source of Pain) (Bilateral) (R>L), Diffuse myofascial pain syndrome, Neurogenic pain, Primary osteoarthritis involving multiple joints, Chronic pain syndrome, and Long term current use of opiate analgesic were also pertinent to this visit.  Status Diagnosis  Controlled Controlled Controlled 1. Lumbar spondylosis   2. Lumbar facet syndrome (Location of Primary Source of Pain) (Bilateral) (R>L)   3. Diffuse myofascial pain syndrome   4. Neurogenic pain   5. Primary osteoarthritis involving multiple joints   6. Chronic pain syndrome   7. Long term current use of opiate analgesic     Problems updated and reviewed during this visit: No problems updated. Plan of Care  Pharmacotherapy (Medications Ordered): Meds ordered this encounter  Medications  . HYDROcodone-acetaminophen (NORCO/VICODIN) 5-325 MG tablet    Sig: Take 1 tablet by mouth every 8 (eight) hours as needed for severe pain.    Dispense:  90 tablet    Refill:  0    Do not place this medication, or any other prescription from our practice, on "Automatic Refill". Patient may have prescription filled one day early if pharmacy is closed on scheduled refill date. Do not fill until: 07/28/2017 To last until:08/27/2017    Order Specific Question:   Supervising Provider    Answer:   Laban Emperor,  FRANCISCO [[id number]]  . HYDROcodone-acetaminophen (NORCO/VICODIN) 5-325 MG tablet    Sig: Take 1 tablet by mouth every 8 (eight) hours as needed for severe pain.    Dispense:  90 tablet    Refill:  0    Do not place this medication, or any  other prescription from our practice, on "Automatic Refill". Patient may have prescription filled one day early if pharmacy is closed on scheduled refill date. Do not fill until: 08/27/2017 To last until:09/26/2017    Order Specific Question:   Supervising Provider    Answer:   Milinda Pointer [phone]  . HYDROcodone-acetaminophen (NORCO/VICODIN) 5-325 MG tablet    Sig: Take 1 tablet by mouth every 8 (eight) hours as needed for severe pain.    Dispense:  90 tablet    Refill:  0    Do not place this medication, or any other prescription from our practice, on "Automatic Refill". Patient may have prescription filled one day early if pharmacy is closed on scheduled refill date. Do not fill until:09/26/2017 To last until:10/26/2017    Order Specific Question:   Supervising Provider    Answer:   Milinda Pointer [phone]  . cyclobenzaprine (FLEXERIL) 10 MG tablet    Sig: Take 1 tablet (10 mg total) by mouth at bedtime.    Dispense:  90 tablet    Refill:  0    Do not place this medication, or any other prescription from our practice, on "Automatic Refill". Patient may have prescription filled one day early if pharmacy is closed on scheduled refill date.    Order Specific Question:   Supervising Provider    Answer:   Milinda Pointer [phone]  . magnesium oxide (MAGNESIUM-OXIDE) 400 (241.3 Mg) MG tablet    Sig: Take 1 tablet (400 mg total) by mouth daily.    Dispense:  90 tablet    Refill:  0    Do not place this medication, or any other prescription from our practice, on "Automatic Refill". Patient may have prescription filled one day early if pharmacy is closed on scheduled refill date.    Order Specific Question:   Supervising Provider    Answer:   Milinda Pointer [phone]  . gabapentin (NEURONTIN) 300 MG capsule    Sig: Take 1 capsule (300 mg total) by mouth 2 (two) times daily.    Dispense:  180 capsule    Refill:  0    Do not place this medication, or any other prescription from  our practice, on "Automatic Refill". Patient may have prescription filled one day early if pharmacy is closed on scheduled refill date.    Order Specific Question:   Supervising Provider    Answer:   Milinda Pointer [phone]  . meloxicam (MOBIC) 7.5 MG tablet    Sig: Take 1 tablet (7.5 mg total) by mouth daily.    Dispense:  90 tablet    Refill:  0    Do not place this medication, or any other prescription from our practice, on "Automatic Refill". Patient may have prescription filled one day early if pharmacy is closed on scheduled refill date.    Order Specific Question:   Supervising Provider    Answer:   Milinda Pointer [[id number]]   New Prescriptions   No medications on file   Medications administered today: Ms. Das had no medications administered during this visit. Lab-work, procedure(s), and/or referral(s): Orders Placed This Encounter  Procedures  . LUMBAR FACET(MEDIAL BRANCH NERVE BLOCK) MBNB  . ToxASSURE Select 13 (MW),  Urine   Imaging and/or referral(s): None  Interventional therapies: Planned, scheduled, and/or pending:   Diagnostic bilateral lumbar facet block with sedation    Considering:   Diagnostic right-sided lumbar facet block  Palliative right-sided lumbar facet radiofrequency ablation  Palliative Right suprascapular muscle trigger point injection  Diagnostic bilateral cervical facet block  Possible bilateral cervical facet radiofrequency ablation  Diagnostic right-sided cervical epidural steroid injection  Diagnostic intra-articular hip joint injection  Possible hip joint radiofrequency ablation **   Palliative PRN treatment(s):   Diagnostic right-sided lumbar facet block  Palliative right-sided lumbar facet radiofrequency ablation  Palliative Right suprascapular muscle trigger point injection  Diagnostic bilateral cervical facet block  Diagnostic right-sided cervical epidural steroid injection  Diagnostic intra-articular hip joint injection     Provider-requested follow-up: Return in about 3 months (around 10/02/2017) for MedMgmt, w/ Dr. Laban Emperor, Procedure(w/Sedation), (ASAP).  Future Appointments Date Time Provider Department Center  07/11/2017 8:30 AM [name], MD [facility] None  08/22/2017 11:00 AM Dale Big Lake, MD [facility] None  10/03/2017 2:00 PM [name], MD [facility] None  10/09/2017 1:45 PM Jeralyn Ruths, MD [facility] None  10/26/2017 1:00 PM O'Brien-Blaney, Vivianne Spence, LPN [facility] None   Primary Care Physician: Dale Emanuel, MD Location: [facility] Note by: Barbette Merino NP Date: 07/02/2017; Time: 1:21 PM  Pain Score Disclaimer: We use the NRS-11 scale. This is a self-reported, subjective measurement of pain severity with only modest accuracy. It is used primarily to identify changes within a particular patient. It must be understood that outpatient pain scales are significantly less accurate that those used for research, where they can be applied under ideal controlled circumstances with minimal exposure to variables. In reality, the score is likely to be a combination of pain intensity and pain affect, where pain affect describes the degree of emotional arousal or changes in action readiness caused by the sensory experience of pain. Factors such as social and work situation, setting, emotional state, anxiety levels, expectation, and prior pain experience may influence pain perception and show large inter-individual differences that may also be affected by time variables.  Patient instructions provided during this appointment: Patient Instructions    ____________________________________________________________________________________________  Medication Rules  Applies to: All patients receiving prescriptions (written or electronic).  Pharmacy of record: Pharmacy where electronic prescriptions will be sent. If written prescriptions are taken to a  different pharmacy, please inform the nursing staff. The pharmacy listed in the electronic medical record should be the one where you would like electronic prescriptions to be sent.  Prescription refills: Only during scheduled appointments. Applies to both, written and electronic prescriptions.  NOTE: The following applies primarily to controlled substances (Opioid* Pain Medications).   Patient's responsibilities: 1. Pain Pills: Bring all pain pills to every appointment (except for procedure appointments). 2. Pill Bottles: Bring pills in original pharmacy bottle. Always bring newest bottle. Bring bottle, even if empty. 3. Medication refills: You are responsible for knowing and keeping track of what medications you need refilled. The day before your appointment, write a list of all prescriptions that need to be refilled. Bring that list to your appointment and give it to the admitting nurse. Prescriptions will be written only during appointments. If you forget a medication, it will not be "Called in", "Faxed", or "electronically sent". You will need to get another appointment to get these prescribed. 4. Prescription Accuracy: You are responsible for carefully inspecting your prescriptions before leaving our office. Have the discharge nurse carefully go over each  prescription with you, before taking them home. Make sure that your name is accurately spelled, that your address is correct. Check the name and dose of your medication to make sure it is accurate. Check the number of pills, and the written instructions to make sure they are clear and accurate. Make sure that you are given enough medication to last until your next medication refill appointment. 5. Taking Medication: Take medication as prescribed. Never take more pills than instructed. Never take medication more frequently than prescribed. Taking less pills or less frequently is permitted and encouraged, when it comes to controlled substances  (written prescriptions).  6. Inform other Doctors: Always inform, all of your healthcare providers, of all the medications you take. 7. Pain Medication from other Providers: You are not allowed to accept any additional pain medication from any other Doctor or Healthcare provider. There are two exceptions to this rule. (see below) In the event that you require additional pain medication, you are responsible for notifying us, as stated below. 8. Medication Agreement: You are responsible for carefully reading and following our Medication Agreement. This must be signed before receiving any prescriptions from our practice. Safely store a copy of your signed Agreement. Violations to the Agreement will result in no further prescriptions. (Additional copies of our Medication Agreement are available upon request.) 9. Laws, Rules, & Regulations: All patients are expected to follow all [address] and [employer], Rules, [place] [employer]. Ignorance of the Laws does not constitute a valid excuse. The use of any illegal substances is prohibited. 10. Adopted CDC guidelines & recommendations: Target dosing levels will be at or below 60 MME/day. Use of benzodiazepines** is not recommended.  Exceptions: There are only two exceptions to the rule of not receiving pain medications from other Healthcare Providers. 1. Exception #1 (Emergencies): In the event of an emergency (i.e.: accident requiring emergency care), you are allowed to receive additional pain medication. However, you are responsible for: As soon as you are able, call our office [phone], at any time of the day or night, and leave a message stating your name, the date and nature of the emergency, and the name and dose of the medication prescribed. In the event that your call is answered by a member of our staff, make sure to document and save the date, time, and the name of the person that took your information.  2. Exception #2 (Planned Surgery): In the  event that you are scheduled by another doctor or dentist to have any type of surgery or procedure, you are allowed (for a period no longer than 30 days), to receive additional pain medication, for the acute post-op pain. However, in this case, you are responsible for picking up a copy of our "Post-op Pain Management for Surgeons" handout, and giving it to your surgeon or dentist. This document is available at our office, and does not require an appointment to obtain it. Simply go to our office during business hours (Monday-Thursday from 8:00 AM to 4:00 PM) (Friday 8:00 AM to 12:00 Noon) or if you have a scheduled appointment with [place], prior to your surgery, and ask for it by name. In addition, you will need to provide [place] with your name, name of your surgeon, type of surgery, and date of procedure or surgery.  *Opioid medications include: morphine, codeine, oxycodone, oxymorphone, hydrocodone, hydromorphone, meperidine, tramadol, tapentadol, buprenorphine, fentanyl, methadone. **Benzodiazepine medications include: diazepam (Valium), alprazolam (Xanax), clonazepam (Klonopine), lorazepam (Ativan), clorazepate (Tranxene), chlordiazepoxide (Librium), estazolam (Prosom), oxazepam (Serax),  temazepam (Restoril), triazolam (Halcion)  ____________________________________________________________________________________________ Pain Management Discharge Instructions  General Discharge Instructions :  If you need to reach your doctor call: Monday-Friday 8:00 am - 4:00 pm at [phone] or toll free [phone].  After clinic hours [phone] to have operator reach doctor.  Bring all of your medication bottles to all your appointments in the pain clinic.  To cancel or reschedule your appointment with Pain Management please remember to call 24 hours in advance to avoid a fee.  Refer to the educational materials which you have been given on: General Risks, I had my Procedure. Discharge Instructions, Post  Sedation.  Post Procedure Instructions:  The drugs you were given will stay in your system until tomorrow, so for the next 24 hours you should not drive, make any legal decisions or drink any alcoholic beverages.  You may eat anything you prefer, but it is better to start with liquids then soups and crackers, and gradually work up to solid foods.  Please notify your doctor immediately if you have any unusual bleeding, trouble breathing or pain that is not related to your normal pain.  Depending on the type of procedure that was done, some parts of your body may feel week and/or numb.  This usually clears up by tonight or the next day.  Walk with the use of an assistive device or accompanied by an adult for the 24 hours.  You may use ice on the affected area for the first 24 hours.  Put ice in a Ziploc bag and cover with a towel and place against area 15 minutes on 15 minutes off.  You may switch to heat after 24 hours.GENERAL RISKS AND COMPLICATIONS  What are the risk, side effects and possible complications? Generally speaking, most procedures are safe.  However, with any procedure there are risks, side effects, and the possibility of complications.  The risks and complications are dependent upon the sites that are lesioned, or the type of nerve block to be performed.  The closer the procedure is to the spine, the more serious the risks are.  Great care is taken when placing the radio frequency needles, block needles or lesioning probes, but sometimes complications can occur. 1. Infection: Any time there is an injection through the skin, there is a risk of infection.  This is why sterile conditions are used for these blocks.  There are four possible types of infection. 1. Localized skin infection. 2. Central Nervous System Infection-This can be in the form of Meningitis, which can be deadly. 3. Epidural Infections-This can be in the form of an epidural abscess, which can cause pressure inside  of the spine, causing compression of the spinal cord with subsequent paralysis. This would require an emergency surgery to decompress, and there are no guarantees that the patient would recover from the paralysis. 4. Discitis-This is an infection of the intervertebral discs.  It occurs in about 1% of discography procedures.  It is difficult to treat and it may lead to surgery.        2. Pain: the needles have to go through skin and soft tissues, will cause soreness.       3. Damage to internal structures:  The nerves to be lesioned may be near blood vessels or    other nerves which can be potentially damaged.       4. Bleeding: Bleeding is more common if the patient is taking blood thinners such as  aspirin, Coumadin, Ticiid, Plavix, etc., or if he/she  have some genetic predisposition  such as hemophilia. Bleeding into the spinal canal can cause compression of the spinal  cord with subsequent paralysis.  This would require an emergency surgery to  decompress and there are no guarantees that the patient would recover from the  paralysis.       5. Pneumothorax:  Puncturing of a lung is a possibility, every time a needle is introduced in  the area of the chest or upper back.  Pneumothorax refers to free air around the  collapsed lung(s), inside of the thoracic cavity (chest cavity).  Another two possible  complications related to a similar event would include: Hemothorax and Chylothorax.   These are variations of the Pneumothorax, where instead of air around the collapsed  lung(s), you may have blood or chyle, respectively.       6. Spinal headaches: They may occur with any procedures in the area of the spine.       7. Persistent CSF (Cerebro-Spinal Fluid) leakage: This is a rare problem, but may occur  with prolonged intrathecal or epidural catheters either due to the formation of a fistulous  track or a dural tear.       8. Nerve damage: By working so close to the spinal cord, there is always a possibility  of  nerve damage, which could be as serious as a permanent spinal cord injury with  paralysis.       9. Death:  Although rare, severe deadly allergic reactions known as "Anaphylactic  reaction" can occur to any of the medications used.      10. Worsening of the symptoms:  We can always make thing worse.  What are the chances of something like this happening? Chances of any of this occuring are extremely low.  By statistics, you have more of a chance of getting killed in a motor vehicle accident: while driving to the hospital than any of the above occurring .  Nevertheless, you should be aware that they are possibilities.  In general, it is similar to taking a shower.  Everybody knows that you can slip, hit your head and get killed.  Does that mean that you should not shower again?  Nevertheless always keep in mind that statistics do not mean anything if you happen to be on the wrong side of them.  Even if a procedure has a 1 (one) in a 1,000,000 (million) chance of going wrong, it you happen to be that one..Also, keep in mind that by statistics, you have more of a chance of having something go wrong when taking medications.  Who should not have this procedure? If you are on a blood thinning medication (e.g. Coumadin, Plavix, see list of "Blood Thinners"), or if you have an active infection going on, you should not have the procedure.  If you are taking any blood thinners, please inform your physician.  How should I prepare for this procedure?  Do not eat or drink anything at least six hours prior to the procedure.  Bring a driver with you .  It cannot be a taxi.  Come accompanied by an adult that can drive you back, and that is strong enough to help you if your legs get weak or numb from the local anesthetic.  Take all of your medicines the morning of the procedure with just enough water to swallow them.  If you have diabetes, make sure that you are scheduled to have your procedure done first  thing in the morning,  whenever possible.  If you have diabetes, take only half of your insulin dose and notify our nurse that you have done so as soon as you arrive at the clinic.  If you are diabetic, but only take blood sugar pills (oral hypoglycemic), then do not take them on the morning of your procedure.  You may take them after you have had the procedure.  Do not take aspirin or any aspirin-containing medications, at least eleven (11) days prior to the procedure.  They may prolong bleeding.  Wear loose fitting clothing that may be easy to take off and that you would not mind if it got stained with Betadine or blood.  Do not wear any jewelry or perfume  Remove any nail coloring.  It will interfere with some of our monitoring equipment.  NOTE: Remember that this is not meant to be interpreted as a complete list of all possible complications.  Unforeseen problems may occur.  BLOOD THINNERS The following drugs contain aspirin or other products, which can cause increased bleeding during surgery and should not be taken for 2 weeks prior to and 1 week after surgery.  If you should need take something for relief of minor pain, you may take acetaminophen which is found in Tylenol,m Datril, Anacin-3 and Panadol. It is not blood thinner. The products listed below are.  Do not take any of the products listed below in addition to any listed on your instruction sheet.  A.P.C or A.P.C with Codeine Codeine Phosphate Capsules #3 Ibuprofen Ridaura  ABC compound Congesprin Imuran rimadil  Advil Cope Indocin Robaxisal  Alka-Seltzer Effervescent Pain Reliever and Antacid Coricidin or Coricidin-D  Indomethacin Rufen  Alka-Seltzer plus Cold Medicine Cosprin Ketoprofen S-A-C Tablets  Anacin Analgesic Tablets or Capsules Coumadin Korlgesic Salflex  Anacin Extra Strength Analgesic tablets or capsules CP-2 Tablets Lanoril Salicylate  Anaprox Cuprimine Capsules Levenox Salocol  Anexsia-D Dalteparin Magan  Salsalate  Anodynos Darvon compound Magnesium Salicylate Sine-off  Ansaid Dasin Capsules Magsal Sodium Salicylate  Anturane Depen Capsules Marnal Soma  APF Arthritis pain formula Dewitt's Pills Measurin Stanback  Argesic Dia-Gesic Meclofenamic Sulfinpyrazone  Arthritis Bayer Timed Release Aspirin Diclofenac Meclomen Sulindac  Arthritis pain formula Anacin Dicumarol Medipren Supac  Analgesic (Safety coated) Arthralgen Diffunasal Mefanamic Suprofen  Arthritis Strength Bufferin Dihydrocodeine Mepro Compound Suprol  Arthropan liquid Dopirydamole Methcarbomol with Aspirin Synalgos  ASA tablets/Enseals Disalcid Micrainin Tagament  Ascriptin Doan's Midol Talwin  Ascriptin A/D Dolene Mobidin Tanderil  Ascriptin Extra Strength Dolobid Moblgesic Ticlid  Ascriptin with Codeine Doloprin or Doloprin with Codeine Momentum Tolectin  Asperbuf Duoprin Mono-gesic Trendar  Aspergum Duradyne Motrin or Motrin IB Triminicin  Aspirin plain, buffered or enteric coated Durasal Myochrisine Trigesic  Aspirin Suppositories Easprin Nalfon Trillsate  Aspirin with Codeine Ecotrin Regular or Extra Strength Naprosyn Uracel  Atromid-S Efficin Naproxen Ursinus  Auranofin Capsules Elmiron Neocylate Vanquish  Axotal Emagrin Norgesic Verin  Azathioprine Empirin or Empirin with Codeine Normiflo Vitamin E  Azolid Emprazil Nuprin Voltaren  Bayer Aspirin plain, buffered or children's or timed BC Tablets or powders Encaprin Orgaran Warfarin Sodium  Buff-a-Comp Enoxaparin Orudis Zorpin  Buff-a-Comp with Codeine Equegesic Os-Cal-Gesic   Buffaprin Excedrin plain, buffered or Extra Strength Oxalid   Bufferin Arthritis Strength Feldene Oxphenbutazone   Bufferin plain or Extra Strength Feldene Capsules Oxycodone with Aspirin   Bufferin with Codeine Fenoprofen Fenoprofen Pabalate or Pabalate-SF   Buffets II Flogesic Panagesic   Buffinol plain or Extra Strength Florinal or Florinal with Codeine Panwarfarin   Buf-Tabs Flurbiprofen  Penicillamine  Butalbital Compound Four-way cold tablets Penicillin   Butazolidin Fragmin Pepto-Bismol   Carbenicillin Geminisyn Percodan   Carna Arthritis Reliever Geopen Persantine   Carprofen Gold's salt Persistin   Chloramphenicol Goody's Phenylbutazone   Chloromycetin Haltrain Piroxlcam   Clmetidine heparin Plaquenil   Cllnoril Hyco-pap Ponstel   Clofibrate Hydroxy chloroquine Propoxyphen         Before stopping any of these medications, be sure to consult the physician who ordered them.  Some, such as Coumadin (Warfarin) are ordered to prevent or treat serious conditions such as "deep thrombosis", "pumonary embolisms", and other heart problems.  The amount of time that you may need off of the medication may also vary with the medication and the reason for which you were taking it.  If you are taking any of these medications, please make sure you notify your pain physician before you undergo any procedures.          Facet Joint Block The facet joints connect the bones of the spine (vertebrae). They make it possible for you to bend, twist, and make other movements with your spine. They also keep you from bending too far, twisting too far, and making other excessive movements. A facet joint block is a procedure where a numbing medicine (anesthetic) is injected into a facet joint. Often, a type of anti-inflammatory medicine called a steroid is also injected. A facet joint block may be done to diagnose neck or back pain. If the pain gets better after a facet joint block, it means the pain is probably coming from the facet joint. If the pain does not get better, it means the pain is probably not coming from the facet joint. A facet joint block may also be done to relieve neck or back pain caused by an inflamed facet joint. A facet joint block is only done to relieve pain if the pain does not improve with other methods, such as medicine, exercise programs, and physical therapy. Tell a  health care provider about:  Any allergies you have.  All medicines you are taking, including vitamins, herbs, eye drops, creams, and over-the-counter medicines.  Any problems you or family members have had with anesthetic medicines.  Any blood disorders you have.  Any surgeries you have had.  Any medical conditions you have.  Whether you are pregnant or may be pregnant. What are the risks? Generally, this is a safe procedure. However, problems may occur, including:  Bleeding.  Injury to a nerve near the injection site.  Pain at the injection site.  Weakness or numbness in areas controlled by nerves near the injection site.  Infection.  Temporary fluid retention.  Allergic reactions to medicines or dyes.  Injury to other structures or organs near the injection site.  What happens before the procedure?  Follow instructions from your health care provider about eating or drinking restrictions.  Ask your health care provider about: ? Changing or stopping your regular medicines. This is especially important if you are taking diabetes medicines or blood thinners. ? Taking medicines such as aspirin and ibuprofen. These medicines can thin your blood. Do not take these medicines before your procedure if your health care provider instructs you not to.  Do not take any new dietary supplements or medicines without asking your health care provider first.  Plan to have someone take you home after the procedure. What happens during the procedure?  You may need to remove your clothing and dress in an open-back gown.  The procedure will be  done while you are lying on an X-ray table. You will most likely be asked to lie on your stomach, but you may be asked to lie in a different position if an injection will be made in your neck.  Machines will be used to monitor your oxygen levels, heart rate, and blood pressure.  If an injection will be made in your neck, an IV tube will be  inserted into one of your veins. Fluids and medicine will flow directly into your body through the IV tube.  The area over the facet joint where the injection will be made will be cleaned with soap. The surrounding skin will be covered with clean drapes.  A numbing medicine (local anesthetic) will be applied to your skin. Your skin may sting or burn for a moment.  A video X-ray machine (fluoroscopy) will be used to locate the joint. In some cases, a CT scan may be used.  A contrast dye may be injected into the facet joint area to help locate the joint.  When the joint is located, an anesthetic will be injected into the joint through the needle.  Your health care provider will ask you whether you feel pain relief. If you do feel relief, a steroid may be injected to provide pain relief for a longer period of time. If you do not feel relief or feel only partial relief, additional injections of an anesthetic may be made in other facet joints.  The needle will be removed.  Your skin will be cleaned.  A bandage (dressing) will be applied over each injection site. The procedure may vary among health care providers and hospitals. What happens after the procedure?  You will be observed for 15-30 minutes before being allowed to go home. This information is not intended to replace advice given to you by your health care provider. Make sure you discuss any questions you have with your health care provider. Document Released: 04/25/2007 Document Revised: 01/05/2016 Document Reviewed: 08/30/2015 Elsevier Interactive Patient Education  [employer].

## 2017-07-09 DIAGNOSIS — I10 Essential (primary) hypertension: Secondary | ICD-10-CM | POA: Diagnosis not present

## 2017-07-09 DIAGNOSIS — Z6831 Body mass index (BMI) 31.0-31.9, adult: Secondary | ICD-10-CM | POA: Diagnosis not present

## 2017-07-09 DIAGNOSIS — M4120 Other idiopathic scoliosis, site unspecified: Secondary | ICD-10-CM | POA: Diagnosis not present

## 2017-07-09 DIAGNOSIS — M545 Low back pain: Secondary | ICD-10-CM | POA: Diagnosis not present

## 2017-07-09 DIAGNOSIS — M810 Age-related osteoporosis without current pathological fracture: Secondary | ICD-10-CM | POA: Diagnosis not present

## 2017-07-09 DIAGNOSIS — M5416 Radiculopathy, lumbar region: Secondary | ICD-10-CM | POA: Diagnosis not present

## 2017-07-10 LAB — TOXASSURE SELECT 13 (MW), URINE

## 2017-07-11 ENCOUNTER — Ambulatory Visit (HOSPITAL_BASED_OUTPATIENT_CLINIC_OR_DEPARTMENT_OTHER): Payer: Medicare Other | Admitting: Pain Medicine

## 2017-07-11 ENCOUNTER — Encounter: Payer: Self-pay | Admitting: Pain Medicine

## 2017-07-11 ENCOUNTER — Ambulatory Visit
Admission: RE | Admit: 2017-07-11 | Discharge: 2017-07-11 | Disposition: A | Discharge status: Home / Self Care | Payer: Medicare Other | Source: Ambulatory Visit | Attending: Pain Medicine | Admitting: Pain Medicine

## 2017-07-11 VITALS — BP 158/95 | HR 65 | Temp 97.2°F | Resp 21 | Ht 64.0 in | Wt 180.0 lb

## 2017-07-11 DIAGNOSIS — Z90710 Acquired absence of both cervix and uterus: Secondary | ICD-10-CM | POA: Insufficient documentation

## 2017-07-11 DIAGNOSIS — Z79899 Other long term (current) drug therapy: Secondary | ICD-10-CM | POA: Diagnosis not present

## 2017-07-11 DIAGNOSIS — Z9889 Other specified postprocedural states: Secondary | ICD-10-CM | POA: Insufficient documentation

## 2017-07-11 DIAGNOSIS — M545 Low back pain: Secondary | ICD-10-CM | POA: Diagnosis not present

## 2017-07-11 DIAGNOSIS — M4696 Unspecified inflammatory spondylopathy, lumbar region: Secondary | ICD-10-CM

## 2017-07-11 DIAGNOSIS — G8929 Other chronic pain: Secondary | ICD-10-CM

## 2017-07-11 DIAGNOSIS — M25552 Pain in left hip: Secondary | ICD-10-CM | POA: Insufficient documentation

## 2017-07-11 DIAGNOSIS — M25551 Pain in right hip: Secondary | ICD-10-CM | POA: Diagnosis not present

## 2017-07-11 DIAGNOSIS — M47816 Spondylosis without myelopathy or radiculopathy, lumbar region: Secondary | ICD-10-CM

## 2017-07-11 MED ORDER — FENTANYL CITRATE (PF) 100 MCG/2ML IJ SOLN
25.0000 ug | INTRAMUSCULAR | Status: DC | PRN
  Administered 2017-07-11: 50 ug via INTRAVENOUS

## 2017-07-11 MED ORDER — FENTANYL CITRATE (PF) 100 MCG/2ML IJ SOLN
INTRAMUSCULAR | Status: AC
  Filled 2017-07-11: qty 2

## 2017-07-11 MED ORDER — MIDAZOLAM HCL 5 MG/5ML IJ SOLN
1.0000 mg | INTRAMUSCULAR | Status: DC | PRN
  Administered 2017-07-11: 2 mg via INTRAVENOUS

## 2017-07-11 MED ORDER — ROPIVACAINE HCL 2 MG/ML IJ SOLN
9.0000 mL | Freq: Once | INTRAMUSCULAR | Status: AC
  Administered 2017-07-11: 10 mL via PERINEURAL

## 2017-07-11 MED ORDER — MIDAZOLAM HCL 5 MG/5ML IJ SOLN
INTRAMUSCULAR | Status: AC
  Filled 2017-07-11: qty 5

## 2017-07-11 MED ORDER — ROPIVACAINE HCL 2 MG/ML IJ SOLN
INTRAMUSCULAR | Status: AC
  Filled 2017-07-11: qty 20

## 2017-07-11 MED ORDER — TRIAMCINOLONE ACETONIDE 40 MG/ML IJ SUSP
40.0000 mg | Freq: Once | INTRAMUSCULAR | Status: AC
  Administered 2017-07-11: 40 mg

## 2017-07-11 MED ORDER — LIDOCAINE HCL (PF) 1.5 % IJ SOLN
20.0000 mL | Freq: Once | INTRAMUSCULAR | Status: AC
  Administered 2017-07-11: 20 mL
  Filled 2017-07-11: qty 20

## 2017-07-11 MED ORDER — LACTATED RINGERS IV SOLN
1000.0000 mL | Freq: Once | INTRAVENOUS | Status: AC
  Administered 2017-07-11: 1000 mL via INTRAVENOUS

## 2017-07-11 MED ORDER — TRIAMCINOLONE ACETONIDE 40 MG/ML IJ SUSP
INTRAMUSCULAR | Status: AC
  Filled 2017-07-11: qty 2

## 2017-07-11 MED ORDER — ROPIVACAINE HCL 2 MG/ML IJ SOLN
9.0000 mL | Freq: Once | INTRAMUSCULAR | Status: AC
  Administered 2017-07-11: 9 mL via PERINEURAL

## 2017-07-11 NOTE — Patient Instructions (Addendum)
____________________________________________________________________________________________  Post-Procedure instructions Instructions:  Apply ice: Fill a plastic sandwich bag with crushed ice. Cover it with a small towel and apply to injection site. Apply for 15 minutes then remove x 15 minutes. Repeat sequence on day of procedure, until you go to bed. The purpose is to minimize swelling and discomfort after procedure.  Apply heat: Apply heat to procedure site starting the day following the procedure. The purpose is to treat any soreness and discomfort from the procedure.  Food intake: Start with clear liquids (like water) and advance to regular food, as tolerated.   Physical activities: Keep activities to a minimum for the first 8 hours after the procedure.   Driving: If you have received any sedation, you are not allowed to drive for 24 hours after your procedure.  Blood thinner: Restart your blood thinner 6 hours after your procedure. (Only for those taking blood thinners)  Insulin: As soon as you can eat, you may resume your normal dosing schedule. (Only for those taking insulin)  Infection prevention: Keep procedure site clean and dry.  Post-procedure Pain Diary: Extremely important that this be done correctly and accurately. Recorded information will be used to determine the next step in treatment.  Pain evaluated is that of treated area only. Do not include pain from an untreated area.  Complete every hour, on the hour, for the initial 8 hours. Set an alarm to help you do this part accurately.  Do not go to sleep and have it completed later. It will not be accurate.  Follow-up appointment: Keep your follow-up appointment after the procedure. Usually 2 weeks for most procedures. (6 weeks in the case of radiofrequency.) Bring you pain diary.  Expect:  From numbing medicine (AKA: Local Anesthetics): Numbness or decrease in pain.  Onset: Full effect within 15 minutes of  injected.  Duration: It will depend on the type of local anesthetic used. On the average, 1 to 8 hours.   From steroids: Decrease in swelling or inflammation. Once inflammation is improved, relief of the pain will follow.  Onset of benefits: Depends on the amount of swelling present. The more swelling, the longer it will take for the benefits to be seen. In some cases, up to 10 days.  Duration: Steroids will stay in the system x 2 weeks. Duration of benefits will depend on multiple posibilities including persistent irritating factors.  From procedure: Some discomfort is to be expected once the numbing medicine wears off. This should be minimal if ice and heat are applied as instructed. Call if:  You experience numbness and weakness that gets worse with time, as opposed to wearing off.  New onset bowel or bladder incontinence. (Spinal procedures only)  Emergency Numbers:  Durning business hours (Monday - Thursday, 8:00 AM - 4:00 PM) (Friday, 9:00 AM - 12:00 Noon): [phone]  After hours: [phone] ____________________________________________________________________________________________  Pain Management Discharge Instructions  General Discharge Instructions :  If you need to reach your doctor call: Monday-Friday 8:00 am - 4:00 pm at [phone] or toll free [phone].  After clinic hours [phone] to have operator reach doctor.  Bring all of your medication bottles to all your appointments in the pain clinic.  To cancel or reschedule your appointment with Pain Management please remember to call 24 hours in advance to avoid a fee.  Refer to the educational materials which you have been given on: General Risks, I had my Procedure. Discharge Instructions, Post Sedation.  Post Procedure Instructions:  The drugs you   were given will stay in your system until tomorrow, so for the next 24 hours you should not drive, make any legal decisions or drink any alcoholic  beverages.  You may eat anything you prefer, but it is better to start with liquids then soups and crackers, and gradually work up to solid foods.  Please notify your doctor immediately if you have any unusual bleeding, trouble breathing or pain that is not related to your normal pain.  Depending on the type of procedure that was done, some parts of your body may feel week and/or numb.  This usually clears up by tonight or the next day.  Walk with the use of an assistive device or accompanied by an adult for the 24 hours.  You may use ice on the affected area for the first 24 hours.  Put ice in a Ziploc bag and cover with a towel and place against area 15 minutes on 15 minutes off.  You may switch to heat after 24 hours.GENERAL RISKS AND COMPLICATIONS  What are the risk, side effects and possible complications? Generally speaking, most procedures are safe.  However, with any procedure there are risks, side effects, and the possibility of complications.  The risks and complications are dependent upon the sites that are lesioned, or the type of nerve block to be performed.  The closer the procedure is to the spine, the more serious the risks are.  Great care is taken when placing the radio frequency needles, block needles or lesioning probes, but sometimes complications can occur. 1. Infection: Any time there is an injection through the skin, there is a risk of infection.  This is why sterile conditions are used for these blocks.  There are four possible types of infection. 1. Localized skin infection. 2. Central Nervous System Infection-This can be in the form of Meningitis, which can be deadly. 3. Epidural Infections-This can be in the form of an epidural abscess, which can cause pressure inside of the spine, causing compression of the spinal cord with subsequent paralysis. This would require an emergency surgery to decompress, and there are no guarantees that the patient would recover from the  paralysis. 4. Discitis-This is an infection of the intervertebral discs.  It occurs in about 1% of discography procedures.  It is difficult to treat and it may lead to surgery.        2. Pain: the needles have to go through skin and soft tissues, will cause soreness.       3. Damage to internal structures:  The nerves to be lesioned may be near blood vessels or    other nerves which can be potentially damaged.       4. Bleeding: Bleeding is more common if the patient is taking blood thinners such as  aspirin, Coumadin, Ticiid, Plavix, etc., or if he/she have some genetic predisposition  such as hemophilia. Bleeding into the spinal canal can cause compression of the spinal  cord with subsequent paralysis.  This would require an emergency surgery to  decompress and there are no guarantees that the patient would recover from the  paralysis.       5. Pneumothorax:  Puncturing of a lung is a possibility, every time a needle is introduced in  the area of the chest or upper back.  Pneumothorax refers to free air around the  collapsed lung(s), inside of the thoracic cavity (chest cavity).  Another two possible  complications related to a similar event would include: Hemothorax and   Chylothorax.   These are variations of the Pneumothorax, where instead of air around the collapsed  lung(s), you may have blood or chyle, respectively.       6. Spinal headaches: They may occur with any procedures in the area of the spine.       7. Persistent CSF (Cerebro-Spinal Fluid) leakage: This is a rare problem, but may occur  with prolonged intrathecal or epidural catheters either due to the formation of a fistulous  track or a dural tear.       8. Nerve damage: By working so close to the spinal cord, there is always a possibility of  nerve damage, which could be as serious as a permanent spinal cord injury with  paralysis.       9. Death:  Although rare, severe deadly allergic reactions known as "Anaphylactic  reaction" can  occur to any of the medications used.      10. Worsening of the symptoms:  We can always make thing worse.  What are the chances of something like this happening? Chances of any of this occuring are extremely low.  By statistics, you have more of a chance of getting killed in a motor vehicle accident: while driving to the hospital than any of the above occurring .  Nevertheless, you should be aware that they are possibilities.  In general, it is similar to taking a shower.  Everybody knows that you can slip, hit your head and get killed.  Does that mean that you should not shower again?  Nevertheless always keep in mind that statistics do not mean anything if you happen to be on the wrong side of them.  Even if a procedure has a 1 (one) in a 1,000,000 (million) chance of going wrong, it you happen to be that one..Also, keep in mind that by statistics, you have more of a chance of having something go wrong when taking medications.  Who should not have this procedure? If you are on a blood thinning medication (e.g. Coumadin, Plavix, see list of "Blood Thinners"), or if you have an active infection going on, you should not have the procedure.  If you are taking any blood thinners, please inform your physician.  How should I prepare for this procedure?  Do not eat or drink anything at least six hours prior to the procedure.  Bring a driver with you .  It cannot be a taxi.  Come accompanied by an adult that can drive you back, and that is strong enough to help you if your legs get weak or numb from the local anesthetic.  Take all of your medicines the morning of the procedure with just enough water to swallow them.  If you have diabetes, make sure that you are scheduled to have your procedure done first thing in the morning, whenever possible.  If you have diabetes, take only half of your insulin dose and notify our nurse that you have done so as soon as you arrive at the clinic.  If you are  diabetic, but only take blood sugar pills (oral hypoglycemic), then do not take them on the morning of your procedure.  You may take them after you have had the procedure.  Do not take aspirin or any aspirin-containing medications, at least eleven (11) days prior to the procedure.  They may prolong bleeding.  Wear loose fitting clothing that may be easy to take off and that you would not mind if it got stained with Betadine   or blood.  Do not wear any jewelry or perfume  Remove any nail coloring.  It will interfere with some of our monitoring equipment.  NOTE: Remember that this is not meant to be interpreted as a complete list of all possible complications.  Unforeseen problems may occur.  BLOOD THINNERS The following drugs contain aspirin or other products, which can cause increased bleeding during surgery and should not be taken for 2 weeks prior to and 1 week after surgery.  If you should need take something for relief of minor pain, you may take acetaminophen which is found in Tylenol,m Datril, Anacin-3 and Panadol. It is not blood thinner. The products listed below are.  Do not take any of the products listed below in addition to any listed on your instruction sheet.  A.P.C or A.P.C with Codeine Codeine Phosphate Capsules #3 Ibuprofen Ridaura  ABC compound Congesprin Imuran rimadil  Advil Cope Indocin Robaxisal  Alka-Seltzer Effervescent Pain Reliever and Antacid Coricidin or Coricidin-D  Indomethacin Rufen  Alka-Seltzer plus Cold Medicine Cosprin Ketoprofen S-A-C Tablets  Anacin Analgesic Tablets or Capsules Coumadin Korlgesic Salflex  Anacin Extra Strength Analgesic tablets or capsules CP-2 Tablets Lanoril Salicylate  Anaprox Cuprimine Capsules Levenox Salocol  Anexsia-D Dalteparin Magan Salsalate  Anodynos Darvon compound Magnesium Salicylate Sine-off  Ansaid Dasin Capsules Magsal Sodium Salicylate  Anturane Depen Capsules Marnal Soma  APF Arthritis pain formula Dewitt's Pills  Measurin Stanback  Argesic Dia-Gesic Meclofenamic Sulfinpyrazone  Arthritis Bayer Timed Release Aspirin Diclofenac Meclomen Sulindac  Arthritis pain formula Anacin Dicumarol Medipren Supac  Analgesic (Safety coated) Arthralgen Diffunasal Mefanamic Suprofen  Arthritis Strength Bufferin Dihydrocodeine Mepro Compound Suprol  Arthropan liquid Dopirydamole Methcarbomol with Aspirin Synalgos  ASA tablets/Enseals Disalcid Micrainin Tagament  Ascriptin Doan's Midol Talwin  Ascriptin A/D Dolene Mobidin Tanderil  Ascriptin Extra Strength Dolobid Moblgesic Ticlid  Ascriptin with Codeine Doloprin or Doloprin with Codeine Momentum Tolectin  Asperbuf Duoprin Mono-gesic Trendar  Aspergum Duradyne Motrin or Motrin IB Triminicin  Aspirin plain, buffered or enteric coated Durasal Myochrisine Trigesic  Aspirin Suppositories Easprin Nalfon Trillsate  Aspirin with Codeine Ecotrin Regular or Extra Strength Naprosyn Uracel  Atromid-S Efficin Naproxen Ursinus  Auranofin Capsules Elmiron Neocylate Vanquish  Axotal Emagrin Norgesic Verin  Azathioprine Empirin or Empirin with Codeine Normiflo Vitamin E  Azolid Emprazil Nuprin Voltaren  Bayer Aspirin plain, buffered or children's or timed BC Tablets or powders Encaprin Orgaran Warfarin Sodium  Buff-a-Comp Enoxaparin Orudis Zorpin  Buff-a-Comp with Codeine Equegesic Os-Cal-Gesic   Buffaprin Excedrin plain, buffered or Extra Strength Oxalid   Bufferin Arthritis Strength Feldene Oxphenbutazone   Bufferin plain or Extra Strength Feldene Capsules Oxycodone with Aspirin   Bufferin with Codeine Fenoprofen Fenoprofen Pabalate or Pabalate-SF   Buffets II Flogesic Panagesic   Buffinol plain or Extra Strength Florinal or Florinal with Codeine Panwarfarin   Buf-Tabs Flurbiprofen Penicillamine   Butalbital Compound Four-way cold tablets Penicillin   Butazolidin Fragmin Pepto-Bismol   Carbenicillin Geminisyn Percodan   Carna Arthritis Reliever Geopen Persantine    Carprofen Gold's salt Persistin   Chloramphenicol Goody's Phenylbutazone   Chloromycetin Haltrain Piroxlcam   Clmetidine heparin Plaquenil   Cllnoril Hyco-pap Ponstel   Clofibrate Hydroxy chloroquine Propoxyphen         Before stopping any of these medications, be sure to consult the physician who ordered them.  Some, such as Coumadin (Warfarin) are ordered to prevent or treat serious conditions such as "deep thrombosis", "pumonary embolisms", and other heart problems.  The amount of time that you may need  off of the medication may also vary with the medication and the reason for which you were taking it.  If you are taking any of these medications, please make sure you notify your pain physician before you undergo any procedures.          Facet Joint Block The facet joints connect the bones of the spine (vertebrae). They make it possible for you to bend, twist, and make other movements with your spine. They also keep you from bending too far, twisting too far, and making other excessive movements. A facet joint block is a procedure where a numbing medicine (anesthetic) is injected into a facet joint. Often, a type of anti-inflammatory medicine called a steroid is also injected. A facet joint block may be done to diagnose neck or back pain. If the pain gets better after a facet joint block, it means the pain is probably coming from the facet joint. If the pain does not get better, it means the pain is probably not coming from the facet joint. A facet joint block may also be done to relieve neck or back pain caused by an inflamed facet joint. A facet joint block is only done to relieve pain if the pain does not improve with other methods, such as medicine, exercise programs, and physical therapy. Tell a health care provider about:  Any allergies you have.  All medicines you are taking, including vitamins, herbs, eye drops, creams, and over-the-counter medicines.  Any problems you or  family members have had with anesthetic medicines.  Any blood disorders you have.  Any surgeries you have had.  Any medical conditions you have.  Whether you are pregnant or may be pregnant. What are the risks? Generally, this is a safe procedure. However, problems may occur, including:  Bleeding.  Injury to a nerve near the injection site.  Pain at the injection site.  Weakness or numbness in areas controlled by nerves near the injection site.  Infection.  Temporary fluid retention.  Allergic reactions to medicines or dyes.  Injury to other structures or organs near the injection site.  What happens before the procedure?  Follow instructions from your health care provider about eating or drinking restrictions.  Ask your health care provider about: ? Changing or stopping your regular medicines. This is especially important if you are taking diabetes medicines or blood thinners. ? Taking medicines such as aspirin and ibuprofen. These medicines can thin your blood. Do not take these medicines before your procedure if your health care provider instructs you not to.  Do not take any new dietary supplements or medicines without asking your health care provider first.  Plan to have someone take you home after the procedure. What happens during the procedure?  You may need to remove your clothing and dress in an open-back gown.  The procedure will be done while you are lying on an X-ray table. You will most likely be asked to lie on your stomach, but you may be asked to lie in a different position if an injection will be made in your neck.  Machines will be used to monitor your oxygen levels, heart rate, and blood pressure.  If an injection will be made in your neck, an IV tube will be inserted into one of your veins. Fluids and medicine will flow directly into your body through the IV tube.  The area over the facet joint where the injection will be made will be cleaned with  soap. The surrounding skin  will be covered with clean drapes.  A numbing medicine (local anesthetic) will be applied to your skin. Your skin may sting or burn for a moment.  A video X-ray machine (fluoroscopy) will be used to locate the joint. In some cases, a CT scan may be used.  A contrast dye may be injected into the facet joint area to help locate the joint.  When the joint is located, an anesthetic will be injected into the joint through the needle.  Your health care provider will ask you whether you feel pain relief. If you do feel relief, a steroid may be injected to provide pain relief for a longer period of time. If you do not feel relief or feel only partial relief, additional injections of an anesthetic may be made in other facet joints.  The needle will be removed.  Your skin will be cleaned.  A bandage (dressing) will be applied over each injection site. The procedure may vary among health care providers and hospitals. What happens after the procedure?  You will be observed for 15-30 minutes before being allowed to go home. This information is not intended to replace advice given to you by your health care provider. Make sure you discuss any questions you have with your health care provider. Document Released: 04/25/2007 Document Revised: 01/05/2016 Document Reviewed: 08/30/2015 Elsevier Interactive Patient Education  2018 [place]  Facet Joint Block, Care After Refer to this sheet in the next few weeks. These instructions provide you with information about caring for yourself after your procedure. Your health care provider may also give you more specific instructions. Your treatment has been planned according to current medical practices, but problems sometimes occur. Call your health care provider if you have any problems or questions after your procedure. What can I expect after the procedure? After the procedure, it is common to have:  Some tenderness over the  injection sites for 2 days after the procedure.  A temporary increase in blood sugar if you have diabetes.  Follow these instructions at home:  Keep track of the amount of pain relief you feel and how long it lasts.  Take over-the-counter and prescription medicines only as told by your health care provider. You may need to limit pain medicine within the first 4-6 hours after the procedure.  Remove your bandages (dressings) the morning after the procedure.  For the first 24 hours after the procedure: ? Do not apply heat near or over the injection sites. ? Do not take a bath or soak in water, such as in a pool or lake. ? Do not drive or operate heavy machinery unless approved by your health care provider. ? Avoid activities that require a lot of energy.  If the injection site is tender, try applying ice to the area. To do this: ? Put ice in a plastic bag. ? Place a towel between your skin and the bag. ? Leave the ice on for 20 minutes, 2-3 times a day.  Keep all follow-up visits as told by your health care provider. This is important. Contact a health care provider if:  Fluid is coming from an injection site.  There is significant bleeding or swelling at an injection site.  You have diabetes and your blood sugar is above 180 mg/dL. Get help right away if:  You have a fever.  You have worsening pain or swelling around an injection site.  There are red streaks around an injection site.  You develop severe pain that  is not controlled by your medicines.  You develop a headache, stiff neck, nausea, or vomiting.  Your eyes become very sensitive to light.  You have weakness, paralysis, or tingling in your arms or legs that was not present before the procedure.  You have difficulty urinating or breathing. This information is not intended to replace advice given to you by your health care provider. Make sure you discuss any questions you have with your health care  provider. Document Released: 11/20/2012 Document Revised: 04/19/2016 Document Reviewed: 08/30/2015 Elsevier Interactive Patient Education  [employer].

## 2017-07-11 NOTE — Progress Notes (Signed)
Patient's Name: Wanda Martinez  MRN: [id number]  Referring Provider: Einar Pheasant, MD  DOB: 09-29-1935  PCP: Einar Pheasant, MD  DOS: 07/11/2017  Note by: Gaspar Cola, MD  Service setting: Ambulatory outpatient  Specialty: Interventional Pain Management  Patient type: Established  Location: [facility] (AMB) Pain Management Facility  Visit type: Interventional Procedure   Primary Reason for Visit: Interventional Pain Management Treatment. CC: Back Pain (lower) and Hip Pain (both)  Procedure:  Anesthesia, Analgesia, Anxiolysis:  Type: Diagnostic Medial Branch Facet Block Region: Lumbar Level: L2, L3, L4, L5, & S1 Medial Branch Level(s) Laterality: Bilateral  Type: Local Anesthesia with Moderate (Conscious) Sedation Local Anesthetic: Lidocaine 1% Route: Intravenous (IV) IV Access: Secured Sedation: Meaningful verbal contact was maintained at all times during the procedure  Indication(s): Analgesia and Anxiety  Indications: 1. Lumbar facet syndrome (Location of Primary Source of Pain) (Bilateral) (R>L)   2. Lumbar spondylosis   3. Chronic low back pain (Location of Primary Source of Pain) (Bilateral) (R>L)    Pain Score: Pre-procedure: 8 /10 Post-procedure: 0-No pain/10  Pre-op Assessment:  Previous date of service: 07/02/17 Service provided: Med Refill Wanda Martinez is a 82 y.o. (year old), female patient, seen today for interventional treatment. She  has a past surgical history that includes Breast biopsy (2013); Tonsillectomy and adenoidectomy (79); Vesicovaginal fistula closure w/ TAH (1983); Breast surgery (1986); Breast enhancement surgery (1987); Breast implant removal; Breast implant removal (Right, 08/29/2012); Mastectomy (08/2012); Abdominal hysterectomy; and Colonoscopy with propofol (N/A, 09/13/2016). [name] has a current medication list which includes the following prescription(s): amlodipine, aspirin, calcium carbonate, carbidopa-levodopa, citalopram, cyclobenzaprine,  gabapentin, glucosamine sulfate, hydrochlorothiazide, hydrocodone-acetaminophen, hydrocodone-acetaminophen, hydrocodone-acetaminophen, letrozole, losartan, lubiprostone, magnesium oxide, meloxicam, metoprolol succinate, multivitamin, omega-3, omeprazole, and pravastatin, and the following Facility-Administered Medications: fentanyl and midazolam. Her primarily concern today is the Back Pain (lower) and Hip Pain (both)  Initial Vital Signs: Last menstrual period 12/18/1981. BMI: Estimated body mass index is 30.9 kg/m as calculated from the following:   Height as of this encounter: 5\' 4"  (1.626 m).   Weight as of this encounter: 180 lb (81.6 kg).  Risk Assessment: Allergies: Reviewed. She is allergic to vesicare [solifenacin].  Allergy Precautions: None required Coagulopathies: Reviewed. None identified.  Blood-thinner therapy: None at this time Active Infection(s): Reviewed. None identified. Wanda Martinez is afebrile  Site Confirmation: Wanda Martinez was asked to confirm the procedure and laterality before marking the site Procedure checklist: Completed Consent: Before the procedure and under the influence of no sedative(s), amnesic(s), or anxiolytics, the patient was informed of the treatment options, risks and possible complications. To fulfill our ethical and legal obligations, as recommended by the American Medical Association's Code of Ethics, I have informed the patient of my clinical impression; the nature and purpose of the treatment or procedure; the risks, benefits, and possible complications of the intervention; the alternatives, including doing nothing; the risk(s) and benefit(s) of the alternative treatment(s) or procedure(s); and the risk(s) and benefit(s) of doing nothing. The patient was provided information about the general risks and possible complications associated with the procedure. These may include, but are not limited to: failure to achieve desired goals, infection, bleeding,  organ or nerve damage, allergic reactions, paralysis, and death. In addition, the patient was informed of those risks and complications associated to Spine-related procedures, such as failure to decrease pain; infection (i.e.: Meningitis, epidural or intraspinal abscess); bleeding (i.e.: epidural hematoma, subarachnoid hemorrhage, or any other type of intraspinal or peri-dural bleeding); organ or nerve damage (i.e.:  Any type of peripheral nerve, nerve root, or spinal cord injury) with subsequent damage to sensory, motor, and/or autonomic systems, resulting in permanent pain, numbness, and/or weakness of one or several areas of the body; allergic reactions; (i.e.: anaphylactic reaction); and/or death. Furthermore, the patient was informed of those risks and complications associated with the medications. These include, but are not limited to: allergic reactions (i.e.: anaphylactic or anaphylactoid reaction(s)); adrenal axis suppression; blood sugar elevation that in diabetics may result in ketoacidosis or comma; water retention that in patients with history of congestive heart failure may result in shortness of breath, pulmonary edema, and decompensation with resultant heart failure; weight gain; swelling or edema; medication-induced neural toxicity; particulate matter embolism and blood vessel occlusion with resultant organ, and/or nervous system infarction; and/or aseptic necrosis of one or more joints. Finally, the patient was informed that Medicine is not an exact science; therefore, there is also the possibility of unforeseen or unpredictable risks and/or possible complications that may result in a catastrophic outcome. The patient indicated having understood very clearly. We have given the patient no guarantees and we have made no promises. Enough time was given to the patient to ask questions, all of which were answered to the patient's satisfaction. [name] has indicated that she wanted to continue with  the procedure. Attestation: I, the ordering provider, attest that I have discussed with the patient the benefits, risks, side-effects, alternatives, likelihood of achieving goals, and potential problems during recovery for the procedure that I have provided informed consent. Date: 07/11/2017; Time: 8:11 AM  Pre-Procedure Preparation:  Monitoring: As per clinic protocol. Respiration, ETCO2, SpO2, BP, heart rate and rhythm monitor placed and checked for adequate function Safety Precautions: Patient was assessed for positional comfort and pressure points before starting the procedure. Time-out: I initiated and conducted the "Time-out" before starting the procedure, as per protocol. The patient was asked to participate by confirming the accuracy of the "Time Out" information. Verification of the correct person, site, and procedure were performed and confirmed by me, the nursing staff, and the patient. "Time-out" conducted as per Joint Commission's Universal Protocol (UP.01.01.01). "Time-out" Date & Time: 07/11/2017; 0936 hrs.  Description of Procedure Process:   Position: Prone Target Area: For Lumbar Facet blocks, the target is the groove formed by the junction of the transverse process and superior articular process. For the L5 dorsal ramus, the target is the notch between superior articular process and sacral ala. For the S1 dorsal ramus, the target is the superior and lateral edge of the posterior S1 Sacral foramen. Approach: Paramedial approach. Area Prepped: Entire Posterior Lumbosacral Region Prepping solution: ChloraPrep (2% chlorhexidine gluconate and 70% isopropyl alcohol) Safety Precautions: Aspiration looking for blood return was conducted prior to all injections. At no point did we inject any substances, as a needle was being advanced. No attempts were made at seeking any paresthesias. Safe injection practices and needle disposal techniques used. Medications properly checked for expiration  dates. SDV (single dose vial) medications used. Description of the Procedure: Protocol guidelines were followed. The patient was placed in position over the fluoroscopy table. The target area was identified and the area prepped in the usual manner. Skin desensitized using vapocoolant spray. Skin & deeper tissues infiltrated with local anesthetic. Appropriate amount of time allowed to pass for local anesthetics to take effect. The procedure needle was introduced through the skin, ipsilateral to the reported pain, and advanced to the target area. Employing the "Medial Branch Technique", the needles were advanced to the  angle made by the superior and medial portion of the transverse process, and the lateral and inferior portion of the superior articulating process of the targeted vertebral bodies. This area is known as "Burton's Eye" or the "Eye of the [place] Dog". A procedure needle was introduced through the skin, and this time advanced to the angle made by the superior and medial border of the sacral ala, and the lateral border of the S1 vertebral body. This last needle was later repositioned at the superior and lateral border of the posterior S1 foramen. Negative aspiration confirmed. Solution injected in intermittent fashion, asking for systemic symptoms every 0.5cc of injectate. The needles were then removed and the area cleansed, making sure to leave some of the prepping solution back to take advantage of its long term bactericidal properties.   Illustration of the posterior view of the lumbar spine and the posterior neural structures. Laminae of L2 through S1 are labeled. DPRL5, dorsal primary ramus of L5; DPRS1, dorsal primary ramus of S1; DPR3, dorsal primary ramus of L3; FJ, facet (zygapophyseal) joint L3-L4; I, inferior articular process of L4; LB1, lateral branch of dorsal primary ramus of L1; IAB, inferior articular branches from L3 medial branch (supplies L4-L5 facet joint); IBP, intermediate  branch plexus; MB3, medial branch of dorsal primary ramus of L3; NR3, third lumbar nerve root; S, superior articular process of L5; SAB, superior articular branches from L4 (supplies L4-5 facet joint also); TP3, transverse process of L3.  Vitals:   07/11/17 0950 07/11/17 1000 07/11/17 1010 07/11/17 1020  BP: (!) 145/78 (!) 174/83 (!) 152/77 (!) 158/95  Pulse:      Resp: 13 12 13  (!) 21  Temp:  (!) 97.2 F (36.2 C)    SpO2: 97% 98% 100% 100%  Weight:      Height:        Start Time: 0938 hrs. End Time: 0949 hrs. Materials:  Needle(s) Type: Regular needle Gauge: 22G Length: 3.5-in Medication(s): We administered lactated ringers, midazolam, fentaNYL, lidocaine, triamcinolone acetonide, ropivacaine (PF) 2 mg/mL (0.2%), triamcinolone acetonide, and ropivacaine (PF) 2 mg/mL (0.2%). Please see chart orders for dosing details.  Imaging Guidance (Spinal):  Type of Imaging Technique: Fluoroscopy Guidance (Spinal) Indication(s): Assistance in needle guidance and placement for procedures requiring needle placement in or near specific anatomical locations not easily accessible without such assistance. Exposure Time: Please see nurses notes. Contrast: None used. Fluoroscopic Guidance: I was personally present during the use of fluoroscopy. "Tunnel Vision Technique" used to obtain the best possible view of the target area. Parallax error corrected before commencing the procedure. "Direction-depth-direction" technique used to introduce the needle under continuous pulsed fluoroscopy. Once target was reached, antero-posterior, oblique, and lateral fluoroscopic projection used confirm needle placement in all planes. Images permanently stored in EMR. Interpretation: No contrast injected. I personally interpreted the imaging intraoperatively. Adequate needle placement confirmed in multiple planes. Permanent images saved into the patient's record.  Antibiotic Prophylaxis:  Indication(s): None  identified Antibiotic given: None  Post-operative Assessment:  EBL: None Complications: No immediate post-treatment complications observed by team, or reported by patient. Note: The patient tolerated the entire procedure well. A repeat set of vitals were taken after the procedure and the patient was kept under observation following institutional policy, for this type of procedure. Post-procedural neurological assessment was performed, showing return to baseline, prior to discharge. The patient was provided with post-procedure discharge instructions, including a section on how to identify potential problems. Should any problems arise concerning this procedure, the patient  was given instructions to immediately contact us, at any time, without hesitation. In any case, we plan to contact the patient by telephone for a follow-up status report regarding this interventional procedure. Comments:  No additional relevant information.  Plan of Care  Disposition: Discharge home  Discharge Date & Time: 07/11/2017; 1020 hrs.  Physician-requested Follow-up:  Return for post-procedure eval (in 2 wks), by MD, in addition, Med-Mgmt, w/ NP.  Future Appointments Date Time Provider [place]  07/23/2017 11:00 AM Milinda Pointer, MD [facility] None  08/22/2017 11:00 AM Einar Pheasant, MD [facility] None  10/03/2017 2:00 PM Milinda Pointer, MD [facility] None  10/09/2017 1:45 PM Lloyd Huger, MD [facility] None  10/26/2017 1:00 PM O'Brien-Blaney, Denisa L, LPN [facility] None   Medications ordered for procedure: Meds ordered this encounter  Medications  . lactated ringers infusion 1,000 mL  . midazolam (VERSED) 5 MG/5ML injection 1-2 mg    Make sure Flumazenil is available in the pyxis when using this medication. If oversedation occurs, administer 0.2 mg IV over 15 sec. If after 45 sec no response, administer 0.2 mg again over 1 min; may repeat at 1 min intervals; not to exceed 4 doses (1 mg)  .  fentaNYL (SUBLIMAZE) injection 25-50 mcg    Make sure Narcan is available in the pyxis when using this medication. In the event of respiratory depression (RR< 8/min): Titrate NARCAN (naloxone) in increments of 0.1 to 0.2 mg IV at 2-3 minute intervals, until desired degree of reversal.  . lidocaine 1.5 % injection 20 mL    From block tray  . triamcinolone acetonide (KENALOG-40) injection 40 mg  . ropivacaine (PF) 2 mg/mL (0.2%) (NAROPIN) injection 9 mL  . triamcinolone acetonide (KENALOG-40) injection 40 mg  . ropivacaine (PF) 2 mg/mL (0.2%) (NAROPIN) injection 9 mL   Medications administered: We administered lactated ringers, midazolam, fentaNYL, lidocaine, triamcinolone acetonide, ropivacaine (PF) 2 mg/mL (0.2%), triamcinolone acetonide, and ropivacaine (PF) 2 mg/mL (0.2%).  See the medical record for exact dosing, route, and time of administration.  Lab-work, Procedure(s), & Referral(s) Ordered: Orders Placed This Encounter  Procedures  . LUMBAR FACET(MEDIAL BRANCH NERVE BLOCK) MBNB  . DG C-Arm 1-60 Min-No Report  . Informed Consent Details: Transcribe to consent form and obtain patient signature  . Provider attestation of informed consent for procedure/surgical case  . Verify informed consent  . Discharge instructions  . Follow-up   Imaging Ordered: Results for orders placed in visit on 01/22/17  DG C-Arm 1-60 Min-No Report   Narrative Fluoroscopy was utilized by the requesting physician.  No radiographic  interpretation.    New Prescriptions   No medications on file   Primary Care Physician: Einar Pheasant, MD Location: [facility] Note by: Gaspar Cola, MD Date: 07/11/2017; Time: 11:35 AM  Disclaimer:  Medicine is not an exact science. The only guarantee in medicine is that nothing is guaranteed. It is important to note that the decision to proceed with this intervention was based on the information collected from the patient. The Data  and conclusions were drawn from the patient's questionnaire, the interview, and the physical examination. Because the information was provided in large part by the patient, it cannot be guaranteed that it has not been purposely or unconsciously manipulated. Every effort has been made to obtain as much relevant data as possible for this evaluation. It is important to note that the conclusions that lead to this procedure are derived in large part from the available data. Always  take into account that the treatment will also be dependent on availability of resources and existing treatment guidelines, considered by other Pain Management Practitioners as being common knowledge and practice, at the time of the intervention. For Medico-Legal purposes, it is also important to point out that variation in procedural techniques and pharmacological choices are the acceptable norm. The indications, contraindications, technique, and results of the above procedure should only be interpreted and judged by a Board-Certified Interventional Pain Specialist with extensive familiarity and expertise in the same exact procedure and technique.  Instructions provided at this appointment: Patient Instructions   ____________________________________________________________________________________________  Post-Procedure instructions Instructions:  Apply ice: Fill a plastic sandwich bag with crushed ice. Cover it with a small towel and apply to injection site. Apply for 15 minutes then remove x 15 minutes. Repeat sequence on day of procedure, until you go to bed. The purpose is to minimize swelling and discomfort after procedure.  Apply heat: Apply heat to procedure site starting the day following the procedure. The purpose is to treat any soreness and discomfort from the procedure.  Food intake: Start with clear liquids (like water) and advance to regular food, as tolerated.   Physical activities: Keep activities to a minimum  for the first 8 hours after the procedure.   Driving: If you have received any sedation, you are not allowed to drive for 24 hours after your procedure.  Blood thinner: Restart your blood thinner 6 hours after your procedure. (Only for those taking blood thinners)  Insulin: As soon as you can eat, you may resume your normal dosing schedule. (Only for those taking insulin)  Infection prevention: Keep procedure site clean and dry.  Post-procedure Pain Diary: Extremely important that this be done correctly and accurately. Recorded information will be used to determine the next step in treatment.  Pain evaluated is that of treated area only. Do not include pain from an untreated area.  Complete every hour, on the hour, for the initial 8 hours. Set an alarm to help you do this part accurately.  Do not go to sleep and have it completed later. It will not be accurate.  Follow-up appointment: Keep your follow-up appointment after the procedure. Usually 2 weeks for most procedures. (6 weeks in the case of radiofrequency.) Bring you pain diary.  Expect:  From numbing medicine (AKA: Local Anesthetics): Numbness or decrease in pain.  Onset: Full effect within 15 minutes of injected.  Duration: It will depend on the type of local anesthetic used. On the average, 1 to 8 hours.   From steroids: Decrease in swelling or inflammation. Once inflammation is improved, relief of the pain will follow.  Onset of benefits: Depends on the amount of swelling present. The more swelling, the longer it will take for the benefits to be seen. In some cases, up to 10 days.  Duration: Steroids will stay in the system x 2 weeks. Duration of benefits will depend on multiple posibilities including persistent irritating factors.  From procedure: Some discomfort is to be expected once the numbing medicine wears off. This should be minimal if ice and heat are applied as instructed. Call if:  You experience numbness and  weakness that gets worse with time, as opposed to wearing off.  New onset bowel or bladder incontinence. (Spinal procedures only)  Emergency Numbers:  [facility] business hours (Monday - Thursday, 8:00 AM - 4:00 PM) (Friday, 9:00 AM - 12:00 Noon): [phone]  After hours: [phone] ____________________________________________________________________________________________  Pain Management Discharge Instructions  General Discharge Instructions :  If you need to reach your doctor call: Monday-Friday 8:00 am - 4:00 pm at [phone] or toll free [phone].  After clinic hours [phone] to have operator reach doctor.  Bring all of your medication bottles to all your appointments in the pain clinic.  To cancel or reschedule your appointment with Pain Management please remember to call 24 hours in advance to avoid a fee.  Refer to the educational materials which you have been given on: General Risks, I had my Procedure. Discharge Instructions, Post Sedation.  Post Procedure Instructions:  The drugs you were given will stay in your system until tomorrow, so for the next 24 hours you should not drive, make any legal decisions or drink any alcoholic beverages.  You may eat anything you prefer, but it is better to start with liquids then soups and crackers, and gradually work up to solid foods.  Please notify your doctor immediately if you have any unusual bleeding, trouble breathing or pain that is not related to your normal pain.  Depending on the type of procedure that was done, some parts of your body may feel week and/or numb.  This usually clears up by tonight or the next day.  Walk with the use of an assistive device or accompanied by an adult for the 24 hours.  You may use ice on the affected area for the first 24 hours.  Put ice in a Ziploc bag and cover with a towel and place against area 15 minutes on 15 minutes off.  You may switch to heat after 24 hours.GENERAL  RISKS AND COMPLICATIONS  What are the risk, side effects and possible complications? Generally speaking, most procedures are safe.  However, with any procedure there are risks, side effects, and the possibility of complications.  The risks and complications are dependent upon the sites that are lesioned, or the type of nerve block to be performed.  The closer the procedure is to the spine, the more serious the risks are.  Great care is taken when placing the radio frequency needles, block needles or lesioning probes, but sometimes complications can occur. 1. Infection: Any time there is an injection through the skin, there is a risk of infection.  This is why sterile conditions are used for these blocks.  There are four possible types of infection. 1. Localized skin infection. 2. Central Nervous System Infection-This can be in the form of Meningitis, which can be deadly. 3. Epidural Infections-This can be in the form of an epidural abscess, which can cause pressure inside of the spine, causing compression of the spinal cord with subsequent paralysis. This would require an emergency surgery to decompress, and there are no guarantees that the patient would recover from the paralysis. 4. Discitis-This is an infection of the intervertebral discs.  It occurs in about 1% of discography procedures.  It is difficult to treat and it may lead to surgery.        2. Pain: the needles have to go through skin and soft tissues, will cause soreness.       3. Damage to internal structures:  The nerves to be lesioned may be near blood vessels or    other nerves which can be potentially damaged.       4. Bleeding: Bleeding is more common if the patient is taking blood thinners such as  aspirin, Coumadin, Ticiid, Plavix, etc., or if he/she have some genetic predisposition  such as hemophilia. Bleeding into the spinal  canal can cause compression of the spinal  cord with subsequent paralysis.  This would require an emergency  surgery to  decompress and there are no guarantees that the patient would recover from the  paralysis.       5. Pneumothorax:  Puncturing of a lung is a possibility, every time a needle is introduced in  the area of the chest or upper back.  Pneumothorax refers to free air around the  collapsed lung(s), inside of the thoracic cavity (chest cavity).  Another two possible  complications related to a similar event would include: Hemothorax and Chylothorax.   These are variations of the Pneumothorax, where instead of air around the collapsed  lung(s), you may have blood or chyle, respectively.       6. Spinal headaches: They may occur with any procedures in the area of the spine.       7. Persistent CSF (Cerebro-Spinal Fluid) leakage: This is a rare problem, but may occur  with prolonged intrathecal or epidural catheters either due to the formation of a fistulous  track or a dural tear.       8. Nerve damage: By working so close to the spinal cord, there is always a possibility of  nerve damage, which could be as serious as a permanent spinal cord injury with  paralysis.       9. Death:  Although rare, severe deadly allergic reactions known as "Anaphylactic  reaction" can occur to any of the medications used.      10. Worsening of the symptoms:  We can always make thing worse.  What are the chances of something like this happening? Chances of any of this occuring are extremely low.  By statistics, you have more of a chance of getting killed in a motor vehicle accident: while driving to the hospital than any of the above occurring .  Nevertheless, you should be aware that they are possibilities.  In general, it is similar to taking a shower.  Everybody knows that you can slip, hit your head and get killed.  Does that mean that you should not shower again?  Nevertheless always keep in mind that statistics do not mean anything if you happen to be on the wrong side of them.  Even if a procedure has a 1 (one) in a  1,000,000 (million) chance of going wrong, it you happen to be that one..Also, keep in mind that by statistics, you have more of a chance of having something go wrong when taking medications.  Who should not have this procedure? If you are on a blood thinning medication (e.g. Coumadin, Plavix, see list of "Blood Thinners"), or if you have an active infection going on, you should not have the procedure.  If you are taking any blood thinners, please inform your physician.  How should I prepare for this procedure?  Do not eat or drink anything at least six hours prior to the procedure.  Bring a driver with you .  It cannot be a taxi.  Come accompanied by an adult that can drive you back, and that is strong enough to help you if your legs get weak or numb from the local anesthetic.  Take all of your medicines the morning of the procedure with just enough water to swallow them.  If you have diabetes, make sure that you are scheduled to have your procedure done first thing in the morning, whenever possible.  If you have diabetes, take only half of your  insulin dose and notify our nurse that you have done so as soon as you arrive at the clinic.  If you are diabetic, but only take blood sugar pills (oral hypoglycemic), then do not take them on the morning of your procedure.  You may take them after you have had the procedure.  Do not take aspirin or any aspirin-containing medications, at least eleven (11) days prior to the procedure.  They may prolong bleeding.  Wear loose fitting clothing that may be easy to take off and that you would not mind if it got stained with Betadine or blood.  Do not wear any jewelry or perfume  Remove any nail coloring.  It will interfere with some of our monitoring equipment.  NOTE: Remember that this is not meant to be interpreted as a complete list of all possible complications.  Unforeseen problems may occur.  BLOOD THINNERS The following drugs contain aspirin  or other products, which can cause increased bleeding during surgery and should not be taken for 2 weeks prior to and 1 week after surgery.  If you should need take something for relief of minor pain, you may take acetaminophen which is found in Tylenol,m Datril, Anacin-3 and Panadol. It is not blood thinner. The products listed below are.  Do not take any of the products listed below in addition to any listed on your instruction sheet.  A.P.C or A.P.C with Codeine Codeine Phosphate Capsules #3 Ibuprofen Ridaura  ABC compound Congesprin Imuran rimadil  Advil Cope Indocin Robaxisal  Alka-Seltzer Effervescent Pain Reliever and Antacid Coricidin or Coricidin-D  Indomethacin Rufen  Alka-Seltzer plus Cold Medicine Cosprin Ketoprofen S-A-C Tablets  Anacin Analgesic Tablets or Capsules Coumadin Korlgesic Salflex  Anacin Extra Strength Analgesic tablets or capsules CP-2 Tablets Lanoril Salicylate  Anaprox Cuprimine Capsules Levenox Salocol  Anexsia-D Dalteparin Magan Salsalate  Anodynos Darvon compound Magnesium Salicylate Sine-off  Ansaid Dasin Capsules Magsal Sodium Salicylate  Anturane Depen Capsules Marnal Soma  APF Arthritis pain formula Dewitt's Pills Measurin Stanback  Argesic Dia-Gesic Meclofenamic Sulfinpyrazone  Arthritis Bayer Timed Release Aspirin Diclofenac Meclomen Sulindac  Arthritis pain formula Anacin Dicumarol Medipren Supac  Analgesic (Safety coated) Arthralgen Diffunasal Mefanamic Suprofen  Arthritis Strength Bufferin Dihydrocodeine Mepro Compound Suprol  Arthropan liquid Dopirydamole Methcarbomol with Aspirin Synalgos  ASA tablets/Enseals Disalcid Micrainin Tagament  Ascriptin Doan's Midol Talwin  Ascriptin A/D Dolene Mobidin Tanderil  Ascriptin Extra Strength Dolobid Moblgesic Ticlid  Ascriptin with Codeine Doloprin or Doloprin with Codeine Momentum Tolectin  Asperbuf Duoprin Mono-gesic Trendar  Aspergum Duradyne Motrin or Motrin IB Triminicin  Aspirin plain, buffered or  enteric coated Durasal Myochrisine Trigesic  Aspirin Suppositories Easprin Nalfon Trillsate  Aspirin with Codeine Ecotrin Regular or Extra Strength Naprosyn Uracel  Atromid-S Efficin Naproxen Ursinus  Auranofin Capsules Elmiron Neocylate Vanquish  Axotal Emagrin Norgesic Verin  Azathioprine Empirin or Empirin with Codeine Normiflo Vitamin E  Azolid Emprazil Nuprin Voltaren  Bayer Aspirin plain, buffered or children's or timed BC Tablets or powders Encaprin Orgaran Warfarin Sodium  Buff-a-Comp Enoxaparin Orudis Zorpin  Buff-a-Comp with Codeine Equegesic Os-Cal-Gesic   Buffaprin Excedrin plain, buffered or Extra Strength Oxalid   Bufferin Arthritis Strength Feldene Oxphenbutazone   Bufferin plain or Extra Strength Feldene Capsules Oxycodone with Aspirin   Bufferin with Codeine Fenoprofen Fenoprofen Pabalate or Pabalate-SF   Buffets II Flogesic Panagesic   Buffinol plain or Extra Strength Florinal or Florinal with Codeine Panwarfarin   Buf-Tabs Flurbiprofen Penicillamine   Butalbital Compound Four-way cold tablets Penicillin   Butazolidin Fragmin  Pepto-Bismol   Carbenicillin Geminisyn Percodan   Carna Arthritis Reliever Geopen Persantine   Carprofen Gold's salt Persistin   Chloramphenicol Goody's Phenylbutazone   Chloromycetin Haltrain Piroxlcam   Clmetidine heparin Plaquenil   Cllnoril Hyco-pap Ponstel   Clofibrate Hydroxy chloroquine Propoxyphen         Before stopping any of these medications, be sure to consult the physician who ordered them.  Some, such as Coumadin (Warfarin) are ordered to prevent or treat serious conditions such as "deep thrombosis", "pumonary embolisms", and other heart problems.  The amount of time that you may need off of the medication may also vary with the medication and the reason for which you were taking it.  If you are taking any of these medications, please make sure you notify your pain physician before you undergo any  procedures.          Facet Joint Block The facet joints connect the bones of the spine (vertebrae). They make it possible for you to bend, twist, and make other movements with your spine. They also keep you from bending too far, twisting too far, and making other excessive movements. A facet joint block is a procedure where a numbing medicine (anesthetic) is injected into a facet joint. Often, a type of anti-inflammatory medicine called a steroid is also injected. A facet joint block may be done to diagnose neck or back pain. If the pain gets better after a facet joint block, it means the pain is probably coming from the facet joint. If the pain does not get better, it means the pain is probably not coming from the facet joint. A facet joint block may also be done to relieve neck or back pain caused by an inflamed facet joint. A facet joint block is only done to relieve pain if the pain does not improve with other methods, such as medicine, exercise programs, and physical therapy. Tell a health care provider about:  Any allergies you have.  All medicines you are taking, including vitamins, herbs, eye drops, creams, and over-the-counter medicines.  Any problems you or family members have had with anesthetic medicines.  Any blood disorders you have.  Any surgeries you have had.  Any medical conditions you have.  Whether you are pregnant or may be pregnant. What are the risks? Generally, this is a safe procedure. However, problems may occur, including:  Bleeding.  Injury to a nerve near the injection site.  Pain at the injection site.  Weakness or numbness in areas controlled by nerves near the injection site.  Infection.  Temporary fluid retention.  Allergic reactions to medicines or dyes.  Injury to other structures or organs near the injection site.  What happens before the procedure?  Follow instructions from your health care provider about eating or drinking  restrictions.  Ask your health care provider about: ? Changing or stopping your regular medicines. This is especially important if you are taking diabetes medicines or blood thinners. ? Taking medicines such as aspirin and ibuprofen. These medicines can thin your blood. Do not take these medicines before your procedure if your health care provider instructs you not to.  Do not take any new dietary supplements or medicines without asking your health care provider first.  Plan to have someone take you home after the procedure. What happens during the procedure?  You may need to remove your clothing and dress in an open-back gown.  The procedure will be done while you are lying on an X-ray table.  You will most likely be asked to lie on your stomach, but you may be asked to lie in a different position if an injection will be made in your neck.  Machines will be used to monitor your oxygen levels, heart rate, and blood pressure.  If an injection will be made in your neck, an IV tube will be inserted into one of your veins. Fluids and medicine will flow directly into your body through the IV tube.  The area over the facet joint where the injection will be made will be cleaned with soap. The surrounding skin will be covered with clean drapes.  A numbing medicine (local anesthetic) will be applied to your skin. Your skin may sting or burn for a moment.  A video X-ray machine (fluoroscopy) will be used to locate the joint. In some cases, a CT scan may be used.  A contrast dye may be injected into the facet joint area to help locate the joint.  When the joint is located, an anesthetic will be injected into the joint through the needle.  Your health care provider will ask you whether you feel pain relief. If you do feel relief, a steroid may be injected to provide pain relief for a longer period of time. If you do not feel relief or feel only partial relief, additional injections of an anesthetic  may be made in other facet joints.  The needle will be removed.  Your skin will be cleaned.  A bandage (dressing) will be applied over each injection site. The procedure may vary among health care providers and hospitals. What happens after the procedure?  You will be observed for 15-30 minutes before being allowed to go home. This information is not intended to replace advice given to you by your health care provider. Make sure you discuss any questions you have with your health care provider. Document Released: 04/25/2007 Document Revised: 01/05/2016 Document Reviewed: 08/30/2015 Elsevier Interactive Patient Education  2018 [place]  Facet Joint Block, Care After Refer to this sheet in the next few weeks. These instructions provide you with information about caring for yourself after your procedure. Your health care provider may also give you more specific instructions. Your treatment has been planned according to current medical practices, but problems sometimes occur. Call your health care provider if you have any problems or questions after your procedure. What can I expect after the procedure? After the procedure, it is common to have:  Some tenderness over the injection sites for 2 days after the procedure.  A temporary increase in blood sugar if you have diabetes.  Follow these instructions at home:  Keep track of the amount of pain relief you feel and how long it lasts.  Take over-the-counter and prescription medicines only as told by your health care provider. You may need to limit pain medicine within the first 4-6 hours after the procedure.  Remove your bandages (dressings) the morning after the procedure.  For the first 24 hours after the procedure: ? Do not apply heat near or over the injection sites. ? Do not take a bath or soak in water, such as in a pool or lake. ? Do not drive or operate heavy machinery unless approved by your health care provider. ? Avoid  activities that require a lot of energy.  If the injection site is tender, try applying ice to the area. To do this: ? Put ice in a plastic bag. ? Place a towel between your skin and  the bag. ? Leave the ice on for 20 minutes, 2-3 times a day.  Keep all follow-up visits as told by your health care provider. This is important. Contact a health care provider if:  Fluid is coming from an injection site.  There is significant bleeding or swelling at an injection site.  You have diabetes and your blood sugar is above 180 mg/dL. Get help right away if:  You have a fever.  You have worsening pain or swelling around an injection site.  There are red streaks around an injection site.  You develop severe pain that is not controlled by your medicines.  You develop a headache, stiff neck, nausea, or vomiting.  Your eyes become very sensitive to light.  You have weakness, paralysis, or tingling in your arms or legs that was not present before the procedure.  You have difficulty urinating or breathing. This information is not intended to replace advice given to you by your health care provider. Make sure you discuss any questions you have with your health care provider. Document Released: 11/20/2012 Document Revised: 04/19/2016 Document Reviewed: 08/30/2015 Elsevier Interactive Patient Education  [employer].

## 2017-07-11 NOTE — Progress Notes (Signed)
Safety precautions to be maintained throughout the outpatient stay will include: orient to surroundings, keep bed in low position, maintain call bell within reach at all times, provide assistance with transfer out of bed and ambulation.  

## 2017-07-12 ENCOUNTER — Telehealth: Payer: Self-pay | Admitting: *Deleted

## 2017-07-12 NOTE — Telephone Encounter (Signed)
Patient verbalizes no questions or concerns from procedure on yesterday. 

## 2017-07-23 ENCOUNTER — Encounter: Payer: Self-pay | Admitting: Pain Medicine

## 2017-07-23 ENCOUNTER — Ambulatory Visit: Payer: Medicare Other | Attending: Pain Medicine | Admitting: Pain Medicine

## 2017-07-23 VITALS — BP 155/77 | HR 64 | Temp 98.3°F | Resp 16 | Ht 64.0 in | Wt 180.0 lb

## 2017-07-23 DIAGNOSIS — M545 Low back pain, unspecified: Secondary | ICD-10-CM

## 2017-07-23 DIAGNOSIS — Z79891 Long term (current) use of opiate analgesic: Secondary | ICD-10-CM | POA: Insufficient documentation

## 2017-07-23 DIAGNOSIS — M533 Sacrococcygeal disorders, not elsewhere classified: Secondary | ICD-10-CM | POA: Diagnosis not present

## 2017-07-23 DIAGNOSIS — Z882 Allergy status to sulfonamides status: Secondary | ICD-10-CM | POA: Insufficient documentation

## 2017-07-23 DIAGNOSIS — Z79811 Long term (current) use of aromatase inhibitors: Secondary | ICD-10-CM | POA: Insufficient documentation

## 2017-07-23 DIAGNOSIS — M5137 Other intervertebral disc degeneration, lumbosacral region: Secondary | ICD-10-CM | POA: Diagnosis not present

## 2017-07-23 DIAGNOSIS — M1990 Unspecified osteoarthritis, unspecified site: Secondary | ICD-10-CM | POA: Insufficient documentation

## 2017-07-23 DIAGNOSIS — K5909 Other constipation: Secondary | ICD-10-CM | POA: Insufficient documentation

## 2017-07-23 DIAGNOSIS — M4722 Other spondylosis with radiculopathy, cervical region: Secondary | ICD-10-CM | POA: Diagnosis not present

## 2017-07-23 DIAGNOSIS — G8929 Other chronic pain: Secondary | ICD-10-CM | POA: Insufficient documentation

## 2017-07-23 DIAGNOSIS — Z8371 Family history of colonic polyps: Secondary | ICD-10-CM | POA: Insufficient documentation

## 2017-07-23 DIAGNOSIS — N3946 Mixed incontinence: Secondary | ICD-10-CM | POA: Diagnosis not present

## 2017-07-23 DIAGNOSIS — Z7982 Long term (current) use of aspirin: Secondary | ICD-10-CM | POA: Diagnosis not present

## 2017-07-23 DIAGNOSIS — Z79899 Other long term (current) drug therapy: Secondary | ICD-10-CM | POA: Diagnosis not present

## 2017-07-23 DIAGNOSIS — M4696 Unspecified inflammatory spondylopathy, lumbar region: Secondary | ICD-10-CM

## 2017-07-23 DIAGNOSIS — I129 Hypertensive chronic kidney disease with stage 1 through stage 4 chronic kidney disease, or unspecified chronic kidney disease: Secondary | ICD-10-CM | POA: Insufficient documentation

## 2017-07-23 DIAGNOSIS — N182 Chronic kidney disease, stage 2 (mild): Secondary | ICD-10-CM | POA: Insufficient documentation

## 2017-07-23 DIAGNOSIS — E669 Obesity, unspecified: Secondary | ICD-10-CM | POA: Insufficient documentation

## 2017-07-23 DIAGNOSIS — M488X2 Other specified spondylopathies, cervical region: Secondary | ICD-10-CM | POA: Diagnosis not present

## 2017-07-23 DIAGNOSIS — G20 Parkinson's disease: Secondary | ICD-10-CM | POA: Diagnosis not present

## 2017-07-23 DIAGNOSIS — Z833 Family history of diabetes mellitus: Secondary | ICD-10-CM | POA: Insufficient documentation

## 2017-07-23 DIAGNOSIS — K219 Gastro-esophageal reflux disease without esophagitis: Secondary | ICD-10-CM | POA: Insufficient documentation

## 2017-07-23 DIAGNOSIS — R339 Retention of urine, unspecified: Secondary | ICD-10-CM | POA: Diagnosis not present

## 2017-07-23 DIAGNOSIS — M47816 Spondylosis without myelopathy or radiculopathy, lumbar region: Secondary | ICD-10-CM | POA: Insufficient documentation

## 2017-07-23 DIAGNOSIS — M791 Myalgia: Secondary | ICD-10-CM | POA: Insufficient documentation

## 2017-07-23 DIAGNOSIS — G894 Chronic pain syndrome: Secondary | ICD-10-CM | POA: Diagnosis not present

## 2017-07-23 DIAGNOSIS — Z6830 Body mass index (BMI) 30.0-30.9, adult: Secondary | ICD-10-CM | POA: Insufficient documentation

## 2017-07-23 DIAGNOSIS — Z823 Family history of stroke: Secondary | ICD-10-CM | POA: Insufficient documentation

## 2017-07-23 DIAGNOSIS — Z5181 Encounter for therapeutic drug level monitoring: Secondary | ICD-10-CM | POA: Insufficient documentation

## 2017-07-23 DIAGNOSIS — Z853 Personal history of malignant neoplasm of breast: Secondary | ICD-10-CM | POA: Insufficient documentation

## 2017-07-23 DIAGNOSIS — M25511 Pain in right shoulder: Secondary | ICD-10-CM | POA: Diagnosis not present

## 2017-07-23 DIAGNOSIS — Z820 Family history of epilepsy and other diseases of the nervous system: Secondary | ICD-10-CM | POA: Insufficient documentation

## 2017-07-23 DIAGNOSIS — M488X6 Other specified spondylopathies, lumbar region: Secondary | ICD-10-CM | POA: Insufficient documentation

## 2017-07-23 DIAGNOSIS — Z888 Allergy status to other drugs, medicaments and biological substances status: Secondary | ICD-10-CM | POA: Insufficient documentation

## 2017-07-23 NOTE — Patient Instructions (Signed)

## 2017-07-23 NOTE — Progress Notes (Signed)
Patient's Name: Wanda Martinez  MRN: [id number]  Referring Provider: Einar Pheasant, MD  DOB: Aug 06, 1935  PCP: Einar Pheasant, MD  DOS: 07/23/2017  Note by: Gaspar Cola, MD  Service setting: Ambulatory outpatient  Specialty: Interventional Pain Management  Location: [facility] (AMB) Pain Management Facility    Patient type: Established   Primary Reason(s) for Visit: Encounter for post-procedure evaluation of chronic illness with mild to moderate exacerbation CC: Back Pain (low) and Hip Pain (left)  HPI  Wanda Martinez is a 82 y.o. year old, female patient, who comes today for a post-procedure evaluation. She has Hypercholesterolemia; Essential hypertension; Frequent UTI; Primary cancer of upper inner quadrant of right female breast ([place]); Tremor; Toenail fungus; Headache; Obesity (BMI 30-39.9); Dysuria; Parkinsons ([name]); Fatigue; Chronic kidney disease (CKD), stage II (mild); Clinical depression; Incomplete bladder emptying; Mixed incontinence; HLD (hyperlipidemia); Bladder infection, chronic; Parkinson's disease ([name]); Pure hypercholesterolemia; Hernia, rectovaginal; Long term current use of opiate analgesic; Long term prescription opiate use; Opiate use; Encounter for therapeutic drug level monitoring; Encounter for chronic pain management; Chronic low back pain (Primary Source of Pain) (Bilateral) (R>L); Lumbar spondylosis; Chronic hip pain; Chronic neck pain; Cervical spondylosis; Chronic cervical radicular pain (Right); Chronic upper extremity pain (Right); Osteoarthritis; Diffuse myofascial pain syndrome; Neurogenic pain; Chronic upper back pain (Right); Myofascial pain syndrome (Right) (cervicothoracic); Vaginitis; Lumbar facet syndrome (Bilateral) (R>L); Malignant neoplasm of right female breast ([name]); Chronic constipation; Cervical facet hypertrophy; Cervical facet syndrome ([name]); Chronic shoulder pain (Right); Chronic pain syndrome; and Chronic sacroiliac joint pain (Left) on her problem list.  Her primarily concern today is the Back Pain (low) and Hip Pain (left)  Pain Assessment: Location: Lower (left hip) Back (hip) Radiating: radiates from low back to left hip, buttock Onset: More than a month ago Duration: Chronic pain Quality: Aching, Constant, Radiating Severity: 6 /10 (self-reported pain score)  Note: Reported level is compatible with observation.                   Effect on ADL:   Timing: Constant Modifying factors: meds, rest  Wanda Martinez comes in today for post-procedure evaluation after the treatment done on 07/11/2017. The right side is significantly improved, but she still has persistent pain on the left side which upon physical exam today it is coming from the SI joint, which we did not block. We'll plan on doing a diagnostic left SI joint block along with the left lumbar facets and see if the accommodation of the 2 providers over the longer lasting benefit. If she does get some benefit, but the pain starts returning, we will proceed with RFA.  Further details on both, my assessment(s), as well as the proposed treatment plan, please see below.  Post-Procedure Assessment  07/11/2017 Procedure: Diagnostic bilateral lumbar facet block under fluoroscopic guidance and IV sedation Pre-procedure pain score:  8/10 Post-procedure pain score: 0/10 (100% relief) Influential Factors: BMI: 30.90 kg/m Intra-procedural challenges: None observed.         Assessment challenges: None detected.              Reported side-effects: None.        Post-procedural adverse reactions or complications: None reported         Sedation: Sedation provided. When no sedatives are used, the analgesic levels obtained are directly associated to the effectiveness of the local anesthetics. However, when sedation is provided, the level of analgesia obtained during the initial 1 hour following the intervention, is believed to be the result of  a combination of factors. These factors may include, but are  not limited to: 1. The effectiveness of the local anesthetics used. 2. The effects of the analgesic(s) and/or anxiolytic(s) used. 3. The degree of discomfort experienced by the patient at the time of the procedure. 4. The patients ability and reliability in recalling and recording the events. 5. The presence and influence of possible secondary gains and/or psychosocial factors. Reported result: Relief experienced during the 1st hour after the procedure: 100 % (Ultra-Short Term Relief)            Interpretative annotation: Clinically appropriate result. Analgesia during this period is likely to be Local Anesthetic and/or IV Sedative (Analgesic/Anxiolytic) related.          Effects of local anesthetic: The analgesic effects attained during this period are directly associated to the localized infiltration of local anesthetics and therefore cary significant diagnostic value as to the etiological location, or anatomical origin, of the pain. Expected duration of relief is directly dependent on the pharmacodynamics of the local anesthetic used. Long-acting (4-6 hours) anesthetics used.  Reported result: Relief during the next 4 to 6 hour after the procedure: 100 % (Short-Term Relief)            Interpretative annotation: Clinically appropriate result. Analgesia during this period is likely to be Local Anesthetic-related.          Long-term benefit: Defined as the period of time past the expected duration of local anesthetics (1 hour for short-acting and 4-6 hours for long-acting). With the possible exception of prolonged sympathetic blockade from the local anesthetics, benefits during this period are typically attributed to, or associated with, other factors such as analgesic sensory neuropraxia, antiinflammatory effects, or beneficial biochemical changes provided by agents other than the local anesthetics.  Reported result: Extended relief following procedure: 95 % (right side 95%, left side 0%) (Long-Term  Relief) The patient has obtained 95% relief of her right lower back pain. However, the left lower back continues to have recurrence of the pain. Physical exam today has confirmed the patient to have a left-sided sacroiliac joint component as well. Interpretative annotation: Clinically appropriate result. Good relief. No permanent benefit expected. Inflammation plays a part in the etiology to the pain.          Current benefits: Defined as persistent relief that continues at this point in time.   Reported results: Treated area: 90 % Wanda Martinez reports improvement in function Interpretative annotation: Recurrence of symptoms. No permanent benefit expected. Effective diagnostic intervention.          Interpretation: Results would suggest a successful diagnostic intervention.                  Plan:  Proceed with diagnostic procedure # 2. In addition, we will go ahead and do a left sided sacroiliac joint block. If the patient obtain similar results, then we will consider doing a bilateral lumbar facet + left sided sacroiliac joint RFA.  Laboratory Chemistry  Inflammation Markers (CRP: Acute Phase) (ESR: Chronic Phase) No results found for: CRP, ESRSEDRATE               Renal Function Markers Lab Results  Component Value Date   BUN 17 05/03/2017   CREATININE 0.98 05/03/2017   GFRAA >60 10/08/2014   GFRNONAA >60 10/08/2014                 Hepatic Function Markers Lab Results  Component Value Date   AST 23 05/03/2017     ALT 20 05/03/2017   ALBUMIN 4.4 05/03/2017   ALKPHOS 53 05/03/2017                 Electrolytes Lab Results  Component Value Date   NA 136 05/03/2017   K 4.2 05/03/2017   CL 99 05/03/2017   CALCIUM 9.8 05/03/2017   MG 1.8 04/21/2013                 Neuropathy Markers Lab Results  Component Value Date   VITAMINB12 881 02/01/2016                 Bone Pathology Markers Lab Results  Component Value Date   ALKPHOS 53 05/03/2017   CALCIUM 9.8 05/03/2017                  Coagulation Parameters Lab Results  Component Value Date   PLT 244.0 10/18/2016                 Cardiovascular Markers Lab Results  Component Value Date   HGB 12.3 10/18/2016   HCT 36.6 10/18/2016                 Note: Lab results reviewed.  Recent Diagnostic Imaging Review  Dg C-arm 1-60 Min-no Report  Result Date: 07/11/2017 Fluoroscopy was utilized by the requesting physician.  No radiographic interpretation.   Note: Imaging results reviewed.          Meds   Current Meds  Medication Sig  . amLODipine (NORVASC) 2.5 MG tablet TAKE 1 TABLET BY MOUTH ONCE DAILY.  . aspirin 81 MG EC tablet Take 81 mg by mouth daily as needed.    . Calcium Carbonate (CALCIUM 600) 1500 MG TABS Take 1 tablet by mouth daily.    . carbidopa-levodopa (SINEMET IR) 25-100 MG tablet Take by mouth.  . citalopram (CELEXA) 20 MG tablet TAKE 1 & 1/2 TABLETS BY MOUTH ONCE DAILY  . [START ON 07/28/2017] cyclobenzaprine (FLEXERIL) 10 MG tablet Take 1 tablet (10 mg total) by mouth at bedtime.  . [START ON 07/28/2017] gabapentin (NEURONTIN) 300 MG capsule Take 1 capsule (300 mg total) by mouth 2 (two) times daily.  . GLUCOSAMINE SULFATE PO Take by mouth daily.   . hydrochlorothiazide (HYDRODIURIL) 25 MG tablet TAKE 1 TABLET BY MOUTH ONCE DAILY. (PATIENT TAKE 1/2 ONCE DAILY AND EXTRA 1/2 IF NEEDED)  . [START ON 07/28/2017] HYDROcodone-acetaminophen (NORCO/VICODIN) 5-325 MG tablet Take 1 tablet by mouth every 8 (eight) hours as needed for severe pain.  . [START ON 08/27/2017] HYDROcodone-acetaminophen (NORCO/VICODIN) 5-325 MG tablet Take 1 tablet by mouth every 8 (eight) hours as needed for severe pain.  . [START ON 09/26/2017] HYDROcodone-acetaminophen (NORCO/VICODIN) 5-325 MG tablet Take 1 tablet by mouth every 8 (eight) hours as needed for severe pain.  . letrozole (FEMARA) 2.5 MG tablet TAKE 1 TABLET BY MOUTH ONCE DAILY.  . losartan (COZAAR) 100 MG tablet TAKE 1 TABLET BY MOUTH ONCE DAILY.  . lubiprostone  (AMITIZA) 24 MCG capsule TAKE 1 CAPSULE BY MOUTH ONCE DAILY WITH BREAKFAST.  . [START ON 07/28/2017] magnesium oxide (MAGNESIUM-OXIDE) 400 (241.3 Mg) MG tablet Take 1 tablet (400 mg total) by mouth daily.  . [START ON 07/28/2017] meloxicam (MOBIC) 7.5 MG tablet Take 1 tablet (7.5 mg total) by mouth daily.  . metoprolol succinate (TOPROL-XL) 25 MG 24 hr tablet TAKE 1 TABLET BY MOUTH TWICE DAILY  . Multiple Vitamin (MULTIVITAMIN) tablet Take 1 tablet by mouth daily.  . Omega-3 1000   MG CAPS Take by mouth daily.   . omeprazole (PRILOSEC) 20 MG capsule TAKE 1 CAPSULE BY MOUTH TWICE DAILY FOR REFLUX.  . pravastatin (PRAVACHOL) 20 MG tablet TAKE 1 TABLET BY MOUTH ONCE DAILY.    ROS  Constitutional: Denies any fever or chills Gastrointestinal: No reported hemesis, hematochezia, vomiting, or acute GI distress Musculoskeletal: Denies any acute onset joint swelling, redness, loss of ROM, or weakness Neurological: No reported episodes of acute onset apraxia, aphasia, dysarthria, agnosia, amnesia, paralysis, loss of coordination, or loss of consciousness  Allergies  Wanda Martinez is allergic to sulfa antibiotics and vesicare [solifenacin].  PFSH  Drug: Wanda Martinez  reports that she does not use drugs. Alcohol:  reports that she does not drink alcohol. Tobacco:  reports that she has never smoked. She has never used smokeless tobacco. Medical:  has a past medical history of Anemia; Arm pain (07/26/2015); Arthritis; Arthritis, degenerative (03/26/2014); Back pain (11/01/2013); Breast cancer (HCC); Breast cancer (HCC); CHEST PAIN (04/29/2010); Chronic cystitis; Cystocele (02/22/2013); Cystocele, midline (08/19/2013); Degeneration of intervertebral disc of lumbosacral region (03/26/2014); DYSPNEA (04/29/2010); Enthesopathy of hip (03/26/2014); GERD (gastroesophageal reflux disease); Hiatal hernia; HTN (hypertension); LBP (low back pain) (03/26/2014); Neck pain (11/01/2013); Parkinson disease (HCC); Sinusitis (02/07/2015); Skin  lesions (07/12/2014); and Urinary incontinence. Surgical: Wanda Martinez  has a past surgical history that includes Breast biopsy (2013); Tonsillectomy and adenoidectomy (79); Vesicovaginal fistula closure w/ TAH (1983); Breast surgery (1986); Breast enhancement surgery (1987); Breast implant removal; Breast implant removal (Right, 08/29/2012); Mastectomy (08/2012); Abdominal hysterectomy; and Colonoscopy with propofol (N/A, 09/13/2016). Family: family history includes Colon polyps in her father; Diabetes in her father; Parkinson's disease in her mother; Stroke in her father and mother.  Constitutional Exam  General appearance: Well nourished, well developed, and well hydrated. In no apparent acute distress Vitals:   07/23/17 1114  BP: (!) 155/77  Pulse: 64  Resp: 16  Temp: 98.3 F (36.8 C)  SpO2: 100%  Weight: 180 lb (81.6 kg)  Height: 5' 4" (1.626 m)   BMI Assessment: Estimated body mass index is 30.9 kg/m as calculated from the following:   Height as of this encounter: 5' 4" (1.626 m).   Weight as of this encounter: 180 lb (81.6 kg).  BMI interpretation table: BMI level Category Range association with higher incidence of chronic pain  <18 kg/m2 Underweight   18.5-24.9 kg/m2 Ideal body weight   25-29.9 kg/m2 Overweight Increased incidence by 20%  30-34.9 kg/m2 Obese (Class I) Increased incidence by 68%  35-39.9 kg/m2 Severe obesity (Class II) Increased incidence by 136%  >40 kg/m2 Extreme obesity (Class III) Increased incidence by 254%   BMI Readings from Last 4 Encounters:  07/23/17 30.90 kg/m  07/11/17 30.90 kg/m  07/02/17 30.73 kg/m  05/02/17 31.64 kg/m   Wt Readings from Last 4 Encounters:  07/23/17 180 lb (81.6 kg)  07/11/17 180 lb (81.6 kg)  07/02/17 179 lb (81.2 kg)  05/02/17 184 lb 4.9 oz (83.6 kg)  Psych/Mental status: Alert, oriented x 3 (person, place, & time)       Eyes: PERLA Respiratory: No evidence of acute respiratory distress  Cervical Spine Area Exam   Skin & Axial Inspection: No masses, redness, edema, swelling, or associated skin lesions Alignment: Symmetrical Functional ROM: Unrestricted ROM      Stability: No instability detected Muscle Tone/Strength: Functionally intact. No obvious neuro-muscular anomalies detected. Sensory (Neurological): Unimpaired Palpation: No palpable anomalies              Upper Extremity (  UE) Exam    Side: Right upper extremity  Side: Left upper extremity  Skin & Extremity Inspection: Skin color, temperature, and hair growth are WNL. No peripheral edema or cyanosis. No masses, redness, swelling, asymmetry, or associated skin lesions. No contractures.  Skin & Extremity Inspection: Skin color, temperature, and hair growth are WNL. No peripheral edema or cyanosis. No masses, redness, swelling, asymmetry, or associated skin lesions. No contractures.  Functional ROM: Unrestricted ROM          Functional ROM: Unrestricted ROM          Muscle Tone/Strength: Functionally intact. No obvious neuro-muscular anomalies detected.  Muscle Tone/Strength: Functionally intact. No obvious neuro-muscular anomalies detected.  Sensory (Neurological): Unimpaired  Sensory (Neurological): Unimpaired  Palpation: No palpable anomalies              Palpation: No palpable anomalies              Specialized Test(s): Deferred         Specialized Test(s): Deferred          Thoracic Spine Area Exam  Skin & Axial Inspection: No masses, redness, or swelling Alignment: Symmetrical Functional ROM: Unrestricted ROM Stability: No instability detected Muscle Tone/Strength: Functionally intact. No obvious neuro-muscular anomalies detected. Sensory (Neurological): Unimpaired Muscle strength & Tone: No palpable anomalies  Lumbar Spine Area Exam  Skin & Axial Inspection: No masses, redness, or swelling Alignment: Symmetrical Functional ROM: Improved after treatment      Stability: No instability detected Muscle Tone/Strength: Functionally intact.  No obvious neuro-muscular anomalies detected. Sensory (Neurological): Movement-associated pain Palpation: Complains of area being tender to palpation       Provocative Tests: Lumbar Hyperextension and rotation test: Positive bilaterally for facet joint pain. Lumbar Lateral bending test: evaluation deferred today       Patrick's Maneuver: Positive for left-sided S-I arthralgia              Gait & Posture Assessment  Ambulation: Limited Gait: Antalgic Posture: Antalgic   Lower Extremity Exam    Side: Right lower extremity  Side: Left lower extremity  Skin & Extremity Inspection: Skin color, temperature, and hair growth are WNL. No peripheral edema or cyanosis. No masses, redness, swelling, asymmetry, or associated skin lesions. No contractures.  Skin & Extremity Inspection: Skin color, temperature, and hair growth are WNL. No peripheral edema or cyanosis. No masses, redness, swelling, asymmetry, or associated skin lesions. No contractures.  Functional ROM: Unrestricted ROM          Functional ROM: Unrestricted ROM          Muscle Tone/Strength: Functionally intact. No obvious neuro-muscular anomalies detected.  Muscle Tone/Strength: Functionally intact. No obvious neuro-muscular anomalies detected.  Sensory (Neurological): Unimpaired  Sensory (Neurological): Unimpaired  Palpation: No palpable anomalies  Palpation: No palpable anomalies   Assessment  Primary Diagnosis & Pertinent Problem List: The primary encounter diagnosis was Chronic low back pain (Primary Source of Pain) (Bilateral) (R>L). Diagnoses of Lumbar facet syndrome (Bilateral) (R>L) and Chronic sacroiliac joint pain (Left) were also pertinent to this visit.  Status Diagnosis  Improving Improving Unimproved 1. Chronic low back pain (Primary Source of Pain) (Bilateral) (R>L)   2. Lumbar facet syndrome (Bilateral) (R>L)   3. Chronic sacroiliac joint pain (Left)     Problems updated and reviewed during this visit: Problem   Chronic sacroiliac joint pain (Left)  Lumbar facet syndrome (Bilateral) (R>L)  Chronic low back pain (Primary Source of Pain) (Bilateral) (R>L)   Plan of   Care  Pharmacotherapy (Medications Ordered): No orders of the defined types were placed in this encounter.  New Prescriptions   No medications on file   Medications administered today: Wanda Martinez had no medications administered during this visit.  Orders:  Procedure Orders     LUMBAR FACET(MEDIAL BRANCH NERVE BLOCK) MBNB     SACROILIAC JOINT INJECTINS Lab Orders  No laboratory test(s) ordered today   Imaging Orders  No imaging studies ordered today   Referral Orders  No referral(s) requested today    Interventional management options: Planned, scheduled, and/or pending:   Diagnostic left sided lumbar facet + sacroiliac joint block under fluoroscopic guidance and IV sedation.  We will consider bilateral lumbar facet + left sacroiliac joint RFA    Considering:   Diagnostic right-sided lumbar facet block  Palliative right-sided lumbar facet radiofrequency ablation  Palliative Right suprascapular muscle trigger point injection  Diagnostic bilateral cervical facet block  Possible bilateral cervical facet radiofrequency ablation  Diagnostic right-sided cervical epidural steroid injection  Diagnostic intra-articular hip joint injection  Possible hip joint radiofrequency ablation    Palliative PRN treatment(s):   Palliative bilateral lumbar facet block    Provider-requested follow-up: Return for Procedure (with sedation): Left L-FCT BLK + SI BLK.  Future Appointments Date Time Provider [place]  08/09/2017 9:15 AM Milinda Pointer, MD [facility] None  08/22/2017 11:00 AM Einar Pheasant, MD [facility] None  10/03/2017 2:00 PM Milinda Pointer, MD [facility] None  10/09/2017 1:45 PM Lloyd Huger, MD [facility] None  10/26/2017 1:00 PM O'Brien-Blaney, Bryson Corona, LPN [facility] None   Primary Care  Physician: Einar Pheasant, MD Location: [facility] Note by: Gaspar Cola, MD Date: 07/23/2017; Time: 1:25 PM  Patient Instructions  ____________________________________________________________________________________________  Preparing for Procedure with Sedation Instructions: . Oral Intake: Do not eat or drink anything for at least 8 hours prior to your procedure. . Transportation: Public transportation is not allowed. Bring an adult driver. The driver must be physically present in our waiting room before any procedure can be started. Marland Kitchen Physical Assistance: Bring an adult physically capable of assisting you, in the event you need help. This adult should keep you company at home for at least 6 hours after the procedure. . Blood Pressure Medicine: Take your blood pressure medicine with a sip of water the morning of the procedure. . Blood thinners:  . Diabetics on insulin: Notify the staff so that you can be scheduled 1st case in the morning. If your diabetes requires high dose insulin, take only  of your normal insulin dose the morning of the procedure and notify the staff that you have done so. . Preventing infections: Shower with an antibacterial soap the morning of your procedure. . Build-up your immune system: Take 1000 mg of Vitamin C with every meal (3 times a day) the day prior to your procedure. Marland Kitchen Antibiotics: Inform the staff if you have a condition or reason that requires you to take antibiotics before dental procedures. . Pregnancy: If you are pregnant, call and cancel the procedure. . Sickness: If you have a cold, fever, or any active infections, call and cancel the procedure. . Arrival: You must be in the facility at least 30 minutes prior to your scheduled procedure. . Children: Do not bring children with you. . Dress appropriately: Bring dark clothing that you would not mind if they get stained. . Valuables: Do not bring any jewelry or  valuables. Procedure appointments are reserved for interventional treatments only. Marland Kitchen  No Prescription Refills. . No medication changes will be discussed during procedure appointments. . No disability issues will be discussed. ____________________________________________________________________________________________

## 2017-07-23 NOTE — Progress Notes (Signed)
Safety precautions to be maintained throughout the outpatient stay will include: orient to surroundings, keep bed in low position, maintain call bell within reach at all times, provide assistance with transfer out of bed and ambulation.  

## 2017-07-26 ENCOUNTER — Other Ambulatory Visit: Payer: Self-pay | Admitting: Internal Medicine

## 2017-08-07 ENCOUNTER — Telehealth: Payer: Self-pay | Admitting: *Deleted

## 2017-08-07 NOTE — Telephone Encounter (Signed)
Yes please

## 2017-08-07 NOTE — Telephone Encounter (Signed)
So does she need to contact her PCP regarding this?

## 2017-08-07 NOTE — Telephone Encounter (Signed)
Patient called asking about getting an appointment to discuss recommendations from her "Bone doctor"  To have something done to strengthen her bones so he can do surgery on her. She states Dr Erline Levine in [place] was supposed to have called Dr Grayland Ormond a while back. Please advise

## 2017-08-07 NOTE — Telephone Encounter (Signed)
Not sure to what they are referring . I checked a bone mineral density last July which revealed osteopenia. But she is not receiving treatment for this.

## 2017-08-08 NOTE — Telephone Encounter (Signed)
I contacted patient and informed her to contact her PCP, her bone density showed Osteopenia and treatment is Ca+ and Vit D which she is taking. Advised her to contact her PCP Dr Ronda Fairly. She stated she would contact her office

## 2017-08-09 ENCOUNTER — Ambulatory Visit (HOSPITAL_BASED_OUTPATIENT_CLINIC_OR_DEPARTMENT_OTHER): Payer: Medicare Other | Admitting: Pain Medicine

## 2017-08-09 ENCOUNTER — Encounter: Payer: Self-pay | Admitting: Pain Medicine

## 2017-08-09 ENCOUNTER — Ambulatory Visit
Admission: RE | Admit: 2017-08-09 | Discharge: 2017-08-09 | Disposition: A | Discharge status: Home / Self Care | Payer: Medicare Other | Source: Ambulatory Visit | Attending: Pain Medicine | Admitting: Pain Medicine

## 2017-08-09 VITALS — BP 163/76 | HR 61 | Temp 98.3°F | Resp 8 | Ht 64.0 in | Wt 175.0 lb

## 2017-08-09 DIAGNOSIS — G8929 Other chronic pain: Secondary | ICD-10-CM | POA: Diagnosis not present

## 2017-08-09 DIAGNOSIS — M533 Sacrococcygeal disorders, not elsewhere classified: Secondary | ICD-10-CM

## 2017-08-09 DIAGNOSIS — M47816 Spondylosis without myelopathy or radiculopathy, lumbar region: Secondary | ICD-10-CM

## 2017-08-09 DIAGNOSIS — M4696 Unspecified inflammatory spondylopathy, lumbar region: Secondary | ICD-10-CM | POA: Insufficient documentation

## 2017-08-09 DIAGNOSIS — M545 Low back pain: Secondary | ICD-10-CM

## 2017-08-09 DIAGNOSIS — M5386 Other specified dorsopathies, lumbar region: Secondary | ICD-10-CM

## 2017-08-09 MED ORDER — METHYLPREDNISOLONE ACETATE 80 MG/ML IJ SUSP
80.0000 mg | Freq: Once | INTRAMUSCULAR | Status: AC
  Administered 2017-08-09: 80 mg via INTRA_ARTICULAR
  Filled 2017-08-09: qty 1

## 2017-08-09 MED ORDER — FENTANYL CITRATE (PF) 100 MCG/2ML IJ SOLN
25.0000 ug | INTRAMUSCULAR | Status: DC | PRN
  Administered 2017-08-09: 100 ug via INTRAVENOUS
  Filled 2017-08-09: qty 2

## 2017-08-09 MED ORDER — LIDOCAINE HCL 2 % IJ SOLN
INTRAMUSCULAR | Status: AC
  Filled 2017-08-09: qty 20

## 2017-08-09 MED ORDER — TRIAMCINOLONE ACETONIDE 40 MG/ML IJ SUSP
40.0000 mg | Freq: Once | INTRAMUSCULAR | Status: AC
  Administered 2017-08-09: 40 mg

## 2017-08-09 MED ORDER — ROPIVACAINE HCL 2 MG/ML IJ SOLN
9.0000 mL | Freq: Once | INTRAMUSCULAR | Status: AC
  Administered 2017-08-09: 10 mL via PERINEURAL

## 2017-08-09 MED ORDER — MIDAZOLAM HCL 5 MG/5ML IJ SOLN
INTRAMUSCULAR | Status: AC
  Filled 2017-08-09: qty 5

## 2017-08-09 MED ORDER — METHYLPREDNISOLONE ACETATE 80 MG/ML IJ SUSP
INTRAMUSCULAR | Status: AC
  Filled 2017-08-09: qty 1

## 2017-08-09 MED ORDER — ROPIVACAINE HCL 2 MG/ML IJ SOLN
9.0000 mL | Freq: Once | INTRAMUSCULAR | Status: AC
  Administered 2017-08-09: 10 mL via INTRA_ARTICULAR

## 2017-08-09 MED ORDER — LIDOCAINE HCL 2 % IJ SOLN
10.0000 mL | Freq: Once | INTRAMUSCULAR | Status: AC
  Administered 2017-08-09: 400 mg

## 2017-08-09 MED ORDER — LACTATED RINGERS IV SOLN
1000.0000 mL | Freq: Once | INTRAVENOUS | Status: AC
  Administered 2017-08-09: 1000 mL via INTRAVENOUS

## 2017-08-09 MED ORDER — MIDAZOLAM HCL 5 MG/5ML IJ SOLN
1.0000 mg | INTRAMUSCULAR | Status: DC | PRN
  Administered 2017-08-09: 5 mg via INTRAVENOUS

## 2017-08-09 MED ORDER — TRIAMCINOLONE ACETONIDE 40 MG/ML IJ SUSP
INTRAMUSCULAR | Status: AC
  Filled 2017-08-09: qty 2

## 2017-08-09 MED ORDER — ROPIVACAINE HCL 2 MG/ML IJ SOLN
INTRAMUSCULAR | Status: AC
  Filled 2017-08-09: qty 20

## 2017-08-09 MED ORDER — ROPIVACAINE HCL 2 MG/ML IJ SOLN
INTRAMUSCULAR | Status: AC
  Filled 2017-08-09: qty 10

## 2017-08-09 NOTE — Progress Notes (Signed)
Safety precautions to be maintained throughout the outpatient stay will include: orient to surroundings, keep bed in low position, maintain call bell within reach at all times, provide assistance with transfer out of bed and ambulation.  

## 2017-08-09 NOTE — Progress Notes (Signed)
Patient's Name: Wanda Martinez  MRN: [id number]  Referring Provider: Einar Pheasant, MD  DOB: [birth year]  PCP: Einar Pheasant, MD  DOS: 08/09/2017  Note by: Gaspar Cola, MD  Service setting: Ambulatory outpatient  Specialty: Interventional Pain Management  Patient type: Established  Location: [facility] (AMB) Pain Management Facility  Visit type: Interventional Procedure   Primary Reason for Visit: Interventional Pain Management Treatment. CC: Back Pain (bilateral)  Procedure:  Anesthesia, Analgesia, Anxiolysis:  Procedure #1: Type: Diagnostic Medial Branch Facet Block Region: Lumbar Level: L2, L3, L4, L5, & S1 Medial Branch Level(s) Laterality: Bilateral  Procedure #2: Type: Diagnostic Sacroiliac Joint Block Region: Posterior Lumbosacral Level: PSIS (Posterior Superior Iliac Spine) Sacroiliac Joint Laterality: Bilateral  Type: Local Anesthesia with Moderate (Conscious) Sedation Local Anesthetic: Lidocaine 1% Route: Intravenous (IV) IV Access: Secured Sedation: Meaningful verbal contact was maintained at all times during the procedure  Indication(s): Analgesia and Anxiety  Indications: 1. Chronic low back pain (Primary Source of Pain) (Bilateral) (R>L)   2. Lumbar facet syndrome (Bilateral) (R>L)   3. Chronic sacroiliac joint pain (Left)    Pain Score: Pre-procedure: 8 /10 Post-procedure: 0-No pain/10  Pre-op Assessment:  Wanda Martinez is a 82 y.o. (year old), female patient, seen today for interventional treatment. She  has a past surgical history that includes Breast biopsy (2013); Tonsillectomy and adenoidectomy (79); Vesicovaginal fistula closure w/ TAH (1983); Breast surgery (1986); Breast enhancement surgery (1987); Breast implant removal; Breast implant removal (Right, 08/29/2012); Mastectomy (08/2012); Abdominal hysterectomy; and Colonoscopy with propofol (N/A, 09/13/2016). Wanda Martinez has a current medication list which includes the following prescription(s): amlodipine,  aspirin, calcium carbonate, carbidopa-levodopa, citalopram, cyclobenzaprine, gabapentin, glucosamine sulfate, hydrochlorothiazide, hydrocodone-acetaminophen, hydrocodone-acetaminophen, hydrocodone-acetaminophen, letrozole, losartan, lubiprostone, magnesium oxide, meloxicam, metoprolol succinate, multivitamin, omega-3, omeprazole, and pravastatin, and the following Facility-Administered Medications: fentanyl and midazolam. Her primarily concern today is the Back Pain (bilateral)  Initial Vital Signs: Blood pressure (!) 153/75, pulse 60, temperature 98.3 F (36.8 C), resp. rate 16, height 5\' 4"  (1.626 m), weight 175 lb (79.4 kg), last menstrual period 12/18/1981, SpO2 100 %. BMI: Estimated body mass index is 30.04 kg/m as calculated from the following:   Height as of this encounter: 5\' 4"  (1.626 m).   Weight as of this encounter: 175 lb (79.4 kg).  Risk Assessment: Allergies: Reviewed. She is allergic to sulfa antibiotics and vesicare [solifenacin].  Allergy Precautions: None required Coagulopathies: Reviewed. None identified.  Blood-thinner therapy: None at this time Active Infection(s): Reviewed. None identified. Wanda Martinez is afebrile  Site Confirmation: Wanda Martinez was asked to confirm the procedure and laterality before marking the site Procedure checklist: Completed Consent: Before the procedure and under the influence of no sedative(s), amnesic(s), or anxiolytics, the patient was informed of the treatment options, risks and possible complications. To fulfill our ethical and legal obligations, as recommended by the American Medical Association's Code of Ethics, I have informed the patient of my clinical impression; the nature and purpose of the treatment or procedure; the risks, benefits, and possible complications of the intervention; the alternatives, including doing nothing; the risk(s) and benefit(s) of the alternative treatment(s) or procedure(s); and the risk(s) and benefit(s) of doing  nothing. The patient was provided information about the general risks and possible complications associated with the procedure. These may include, but are not limited to: failure to achieve desired goals, infection, bleeding, organ or nerve damage, allergic reactions, paralysis, and death. In addition, the patient was informed of those risks and complications associated to [facility] procedures, such as  failure to decrease pain; infection (i.e.: Meningitis, epidural or intraspinal abscess); bleeding (i.e.: epidural hematoma, subarachnoid hemorrhage, or any other type of intraspinal or peri-dural bleeding); organ or nerve damage (i.e.: Any type of peripheral nerve, nerve root, or spinal cord injury) with subsequent damage to sensory, motor, and/or autonomic systems, resulting in permanent pain, numbness, and/or weakness of one or several areas of the body; allergic reactions; (i.e.: anaphylactic reaction); and/or death. Furthermore, the patient was informed of those risks and complications associated with the medications. These include, but are not limited to: allergic reactions (i.e.: anaphylactic or anaphylactoid reaction(s)); adrenal axis suppression; blood sugar elevation that in diabetics may result in ketoacidosis or comma; water retention that in patients with history of congestive heart failure may result in shortness of breath, pulmonary edema, and decompensation with resultant heart failure; weight gain; swelling or edema; medication-induced neural toxicity; particulate matter embolism and blood vessel occlusion with resultant organ, and/or nervous system infarction; and/or aseptic necrosis of one or more joints. Finally, the patient was informed that Medicine is not an exact science; therefore, there is also the possibility of unforeseen or unpredictable risks and/or possible complications that may result in a catastrophic outcome. The patient indicated having understood very clearly. We have given  the patient no guarantees and we have made no promises. Enough time was given to the patient to ask questions, all of which were answered to the patient's satisfaction. Wanda Martinez has indicated that she wanted to continue with the procedure. Attestation: I, the ordering provider, attest that I have discussed with the patient the benefits, risks, side-effects, alternatives, likelihood of achieving goals, and potential problems during recovery for the procedure that I have provided informed consent. Date: 08/09/2017; Time: 9:35 AM  Pre-Procedure Preparation:  Monitoring: As per clinic protocol. Respiration, ETCO2, SpO2, BP, heart rate and rhythm monitor placed and checked for adequate function Safety Precautions: Patient was assessed for positional comfort and pressure points before starting the procedure. Time-out: I initiated and conducted the "Time-out" before starting the procedure, as per protocol. The patient was asked to participate by confirming the accuracy of the "Time Out" information. Verification of the correct person, site, and procedure were performed and confirmed by me, the nursing staff, and the patient. "Time-out" conducted as per Joint Commission's Universal Protocol (UP.01.01.01). "Time-out" Date & Time: 08/09/2017; 1004 hrs.  Description of Procedure #1 Process:   Time-out: "Time-out" completed before starting procedure, as per protocol. Position: Prone Target Area: For Lumbar Facet blocks, the target is the groove formed by the junction of the transverse process and superior articular process. For the L5 dorsal ramus, the target is the notch between superior articular process and sacral ala. For the S1 dorsal ramus, the target is the superior and lateral edge of the posterior S1 Sacral foramen. Approach: Paramedial approach. Area Prepped: Entire Posterior Lumbosacral Region Prepping solution: ChloraPrep (2% chlorhexidine gluconate and 70% isopropyl alcohol) Safety Precautions:  Aspiration looking for blood return was conducted prior to all injections. At no point did we inject any substances, as a needle was being advanced. No attempts were made at seeking any paresthesias. Safe injection practices and needle disposal techniques used. Medications properly checked for expiration dates. SDV (single dose vial) medications used.  Description of the Procedure: Protocol guidelines were followed. The patient was placed in position over the fluoroscopy table. The target area was identified and the area prepped in the usual manner. Skin desensitized using vapocoolant spray. Skin & deeper tissues infiltrated with local anesthetic. Appropriate  amount of time allowed to pass for local anesthetics to take effect. The procedure needle was introduced through the skin, ipsilateral to the reported pain, and advanced to the target area. Employing the "Medial Branch Technique", the needles were advanced to the angle made by the superior and medial portion of the transverse process, and the lateral and inferior portion of the superior articulating process of the targeted vertebral bodies. This area is known as "Burton's Eye" or the "Eye of the [place] Dog". A procedure needle was introduced through the skin, and this time advanced to the angle made by the superior and medial border of the sacral ala, and the lateral border of the S1 vertebral body. This last needle was later repositioned at the superior and lateral border of the posterior S1 foramen. Negative aspiration confirmed. Solution injected in intermittent fashion, asking for systemic symptoms every 0.5cc of injectate. The needles were then removed and the area cleansed, making sure to leave some of the prepping solution back to take advantage of its long term bactericidal properties. Start Time: 1005 hrs. Materials:  Needle(s) Type: Regular needle Gauge: 22G Length: 3.5-in Medication(s): We administered lactated ringers, midazolam, fentaNYL,  lidocaine, triamcinolone acetonide, triamcinolone acetonide, ropivacaine (PF) 2 mg/mL (0.2%), ropivacaine (PF) 2 mg/mL (0.2%), methylPREDNISolone acetate, and ropivacaine (PF) 2 mg/mL (0.2%). Please see chart orders for dosing details.  Description of Procedure # 2 Process:   Position: Prone Target Area: For upper sacroiliac joint block(s), the target is the superior and posterior margin of the sacroiliac joint. Approach: Ipsilateral approach. Area Prepped: Entire Posterior Lumbosacral Region Prepping solution: ChloraPrep (2% chlorhexidine gluconate and 70% isopropyl alcohol) Safety Precautions: Aspiration looking for blood return was conducted prior to all injections. At no point did we inject any substances, as a needle was being advanced. No attempts were made at seeking any paresthesias. Safe injection practices and needle disposal techniques used. Medications properly checked for expiration dates. SDV (single dose vial) medications used. Description of the Procedure: Protocol guidelines were followed. The patient was placed in position over the fluoroscopy table. The target area was identified and the area prepped in the usual manner. Skin desensitized using vapocoolant spray. Skin & deeper tissues infiltrated with local anesthetic. Appropriate amount of time allowed to pass for local anesthetics to take effect. The procedure needle was advanced under fluoroscopic guidance into the sacroiliac joint until a firm endpoint was obtained. Proper needle placement secured. Negative aspiration confirmed. Solution injected in intermittent fashion, asking for systemic symptoms every 0.5cc of injectate. The needles were then removed and the area cleansed, making sure to leave some of the prepping solution back to take advantage of its long term bactericidal properties. Vitals:   08/09/17 1019 08/09/17 1026 08/09/17 1036 08/09/17 1046  BP: (!) 164/80 (!) 187/83 (!) 169/71 (!) 163/76  Pulse: 61     Resp: 12 12  12  (!) 8  Temp:      SpO2: 94% 94% 97% 99%  Weight:      Height:        End Time: 1016 hrs. Materials:  Needle(s) Type: Regular needle Gauge: 22G Length: 3.5-in Medication(s): We administered lactated ringers, midazolam, fentaNYL, lidocaine, triamcinolone acetonide, triamcinolone acetonide, ropivacaine (PF) 2 mg/mL (0.2%), ropivacaine (PF) 2 mg/mL (0.2%), methylPREDNISolone acetate, and ropivacaine (PF) 2 mg/mL (0.2%). Please see chart orders for dosing details.  Imaging Guidance (Spinal):  Type of Imaging Technique: Fluoroscopy Guidance (Spinal) Indication(s): Assistance in needle guidance and placement for procedures requiring needle placement in or near specific  anatomical locations not easily accessible without such assistance. Exposure Time: Please see nurses notes. Contrast: None used. Fluoroscopic Guidance: I was personally present during the use of fluoroscopy. "Tunnel Vision Technique" used to obtain the best possible view of the target area. Parallax error corrected before commencing the procedure. "Direction-depth-direction" technique used to introduce the needle under continuous pulsed fluoroscopy. Once target was reached, antero-posterior, oblique, and lateral fluoroscopic projection used confirm needle placement in all planes. Images permanently stored in EMR. Interpretation: No contrast injected. I personally interpreted the imaging intraoperatively. Adequate needle placement confirmed in multiple planes. Permanent images saved into the patient's record.  Antibiotic Prophylaxis:  Indication(s): None identified Antibiotic given: None  Post-operative Assessment:  EBL: None Complications: No immediate post-treatment complications observed by team, or reported by patient. Note: The patient tolerated the entire procedure well. A repeat set of vitals were taken after the procedure and the patient was kept under observation following institutional policy, for this type of  procedure. Post-procedural neurological assessment was performed, showing return to baseline, prior to discharge. The patient was provided with post-procedure discharge instructions, including a section on how to identify potential problems. Should any problems arise concerning this procedure, the patient was given instructions to immediately contact us, at any time, without hesitation. In any case, we plan to contact the patient by telephone for a follow-up status report regarding this interventional procedure. Comments:  No additional relevant information.  Plan of Care  Disposition: Discharge home  Discharge Date & Time: 08/09/2017; 1054 hrs.  Physician-requested Follow-up:  Return for post-procedure eval by Dr. Dossie Arbour in 2 weeks.  Future Appointments Date Time Provider Cecilia  08/22/2017 11:00 AM Einar Pheasant, MD [facility] None  09/10/2017 9:15 AM Milinda Pointer, MD [facility] None  10/03/2017 2:00 PM Milinda Pointer, MD [facility] None  10/09/2017 1:45 PM Lloyd Huger, MD [facility] None  10/26/2017 1:00 PM O'Brien-Blaney, Denisa L, LPN [facility] None    Imaging Orders     DG C-Arm 1-60 Min-No Report  Procedure Orders     LUMBAR FACET(MEDIAL BRANCH NERVE BLOCK) MBNB     SACROILIAC JOINT INJECTINS  Medications ordered for procedure: Meds ordered this encounter  Medications  . lactated ringers infusion 1,000 mL  . midazolam (VERSED) 5 MG/5ML injection 1-2 mg    Make sure Flumazenil is available in the pyxis when using this medication. If oversedation occurs, administer 0.2 mg IV over 15 sec. If after 45 sec no response, administer 0.2 mg again over 1 min; may repeat at 1 min intervals; not to exceed 4 doses (1 mg)  . fentaNYL (SUBLIMAZE) injection 25-50 mcg    Make sure Narcan is available in the pyxis when using this medication. In the event of respiratory depression (RR< 8/min): Titrate NARCAN (naloxone) in increments of 0.1 to 0.2 mg IV at 2-3 minute  intervals, until desired degree of reversal.  . lidocaine (XYLOCAINE) 2 % (with pres) injection 200 mg  . triamcinolone acetonide (KENALOG-40) injection 40 mg  . triamcinolone acetonide (KENALOG-40) injection 40 mg  . ropivacaine (PF) 2 mg/mL (0.2%) (NAROPIN) injection 9 mL  . ropivacaine (PF) 2 mg/mL (0.2%) (NAROPIN) injection 9 mL  . methylPREDNISolone acetate (DEPO-MEDROL) injection 80 mg  . ropivacaine (PF) 2 mg/mL (0.2%) (NAROPIN) injection 9 mL   Medications administered: We administered lactated ringers, midazolam, fentaNYL, lidocaine, triamcinolone acetonide, triamcinolone acetonide, ropivacaine (PF) 2 mg/mL (0.2%), ropivacaine (PF) 2 mg/mL (0.2%), methylPREDNISolone acetate, and ropivacaine (PF) 2 mg/mL (0.2%).  See the medical record for exact  dosing, route, and time of administration.  New Prescriptions   No medications on file   Primary Care Physician: Einar Pheasant, MD Location: [facility] Note by: Gaspar Cola, MD Date: 08/09/2017; Time: 11:10 AM  Disclaimer:  Medicine is not an exact science. The only guarantee in medicine is that nothing is guaranteed. It is important to note that the decision to proceed with this intervention was based on the information collected from the patient. The Data and conclusions were drawn from the patient's questionnaire, the interview, and the physical examination. Because the information was provided in large part by the patient, it cannot be guaranteed that it has not been purposely or unconsciously manipulated. Every effort has been made to obtain as much relevant data as possible for this evaluation. It is important to note that the conclusions that lead to this procedure are derived in large part from the available data. Always take into account that the treatment will also be dependent on availability of resources and existing treatment guidelines, considered by other Pain Management Practitioners as  being common knowledge and practice, at the time of the intervention. For Medico-Legal purposes, it is also important to point out that variation in procedural techniques and pharmacological choices are the acceptable norm. The indications, contraindications, technique, and results of the above procedure should only be interpreted and judged by a Board-Certified Interventional Pain Specialist with extensive familiarity and expertise in the same exact procedure and technique.

## 2017-08-09 NOTE — Patient Instructions (Addendum)
____________________________________________________________________________________________  Post-Procedure instructions Instructions:  Apply ice: Fill a plastic sandwich bag with crushed ice. Cover it with a small towel and apply to injection site. Apply for 15 minutes then remove x 15 minutes. Repeat sequence on day of procedure, until you go to bed. The purpose is to minimize swelling and discomfort after procedure.  Apply heat: Apply heat to procedure site starting the day following the procedure. The purpose is to treat any soreness and discomfort from the procedure.  Food intake: Start with clear liquids (like water) and advance to regular food, as tolerated.   Physical activities: Keep activities to a minimum for the first 8 hours after the procedure.   Driving: If you have received any sedation, you are not allowed to drive for 24 hours after your procedure.  Blood thinner: Restart your blood thinner 6 hours after your procedure. (Only for those taking blood thinners)  Insulin: As soon as you can eat, you may resume your normal dosing schedule. (Only for those taking insulin)  Infection prevention: Keep procedure site clean and dry.  Post-procedure Pain Diary: Extremely important that this be done correctly and accurately. Recorded information will be used to determine the next step in treatment.  Pain evaluated is that of treated area only. Do not include pain from an untreated area.  Complete every hour, on the hour, for the initial 8 hours. Set an alarm to help you do this part accurately.  Do not go to sleep and have it completed later. It will not be accurate.  Follow-up appointment: Keep your follow-up appointment after the procedure. Usually 2 weeks for most procedures. (6 weeks in the case of radiofrequency.) Bring you pain diary.  Expect:  From numbing medicine (AKA: Local Anesthetics): Numbness or decrease in pain.  Onset: Full effect within 15 minutes of  injected.  Duration: It will depend on the type of local anesthetic used. On the average, 1 to 8 hours.   From steroids: Decrease in swelling or inflammation. Once inflammation is improved, relief of the pain will follow.  Onset of benefits: Depends on the amount of swelling present. The more swelling, the longer it will take for the benefits to be seen. In some cases, up to 10 days.  Duration: Steroids will stay in the system x 2 weeks. Duration of benefits will depend on multiple posibilities including persistent irritating factors.  From procedure: Some discomfort is to be expected once the numbing medicine wears off. This should be minimal if ice and heat are applied as instructed. Call if:  You experience numbness and weakness that gets worse with time, as opposed to wearing off.  New onset bowel or bladder incontinence. (Spinal procedures only)  Emergency Numbers:  Durning business hours (Monday - Thursday, 8:00 AM - 4:00 PM) (Friday, 9:00 AM - 12:00 Noon): [phone]  After hours: [phone] ____________________________________________________________________________________________  Pain Management Discharge Instructions  General Discharge Instructions :  If you need to reach your doctor call: Monday-Friday 8:00 am - 4:00 pm at [phone] or toll free [phone].  After clinic hours [phone] to have operator reach doctor.  Bring all of your medication bottles to all your appointments in the pain clinic.  To cancel or reschedule your appointment with Pain Management please remember to call 24 hours in advance to avoid a fee.  Refer to the educational materials which you have been given on: General Risks, I had my Procedure. Discharge Instructions, Post Sedation.  Post Procedure Instructions:  The drugs you   were given will stay in your system until tomorrow, so for the next 24 hours you should not drive, make any legal decisions or drink any alcoholic  beverages.  You may eat anything you prefer, but it is better to start with liquids then soups and crackers, and gradually work up to solid foods.  Please notify your doctor immediately if you have any unusual bleeding, trouble breathing or pain that is not related to your normal pain.  Depending on the type of procedure that was done, some parts of your body may feel week and/or numb.  This usually clears up by tonight or the next day.  Walk with the use of an assistive device or accompanied by an adult for the 24 hours.  You may use ice on the affected area for the first 24 hours.  Put ice in a Ziploc bag and cover with a towel and place against area 15 minutes on 15 minutes off.  You may switch to heat after 24 hours. 

## 2017-08-10 ENCOUNTER — Telehealth: Payer: Self-pay

## 2017-08-10 NOTE — Telephone Encounter (Signed)
Post procedure phone call.  Patient states she is doing better.  

## 2017-08-15 ENCOUNTER — Telehealth: Payer: Self-pay | Admitting: Internal Medicine

## 2017-08-15 NOTE — Telephone Encounter (Signed)
Pt lvm on referral line requesting Dr. Bary Leriche CMA to call her back. She has a bad bladder infection and doesn't want to come in and she does not have anyone to bring her. Pt cb [phone].

## 2017-08-15 NOTE — Telephone Encounter (Signed)
Called patient informed that she will need to be evaluated for any medications to be called in. She will try and get someone to take her to acute care this afternoon to be seen.

## 2017-08-22 ENCOUNTER — Ambulatory Visit (INDEPENDENT_AMBULATORY_CARE_PROVIDER_SITE_OTHER): Payer: Medicare Other | Admitting: Internal Medicine

## 2017-08-22 ENCOUNTER — Other Ambulatory Visit (HOSPITAL_COMMUNITY)
Admission: RE | Admit: 2017-08-22 | Discharge: 2017-08-22 | Disposition: A | Discharge status: Home / Self Care | Payer: Medicare Other | Source: Ambulatory Visit | Attending: Internal Medicine | Admitting: Internal Medicine

## 2017-08-22 ENCOUNTER — Encounter: Payer: Self-pay | Admitting: Internal Medicine

## 2017-08-22 VITALS — BP 122/82 | HR 71 | Temp 98.6°F | Resp 14 | Ht 64.0 in | Wt 176.6 lb

## 2017-08-22 DIAGNOSIS — M47816 Spondylosis without myelopathy or radiculopathy, lumbar region: Secondary | ICD-10-CM

## 2017-08-22 DIAGNOSIS — R251 Tremor, unspecified: Secondary | ICD-10-CM

## 2017-08-22 DIAGNOSIS — N898 Other specified noninflammatory disorders of vagina: Secondary | ICD-10-CM | POA: Insufficient documentation

## 2017-08-22 DIAGNOSIS — G20 Parkinson's disease: Secondary | ICD-10-CM

## 2017-08-22 DIAGNOSIS — I10 Essential (primary) hypertension: Secondary | ICD-10-CM

## 2017-08-22 DIAGNOSIS — F329 Major depressive disorder, single episode, unspecified: Secondary | ICD-10-CM | POA: Diagnosis not present

## 2017-08-22 DIAGNOSIS — E7800 Pure hypercholesterolemia, unspecified: Secondary | ICD-10-CM

## 2017-08-22 DIAGNOSIS — M4696 Unspecified inflammatory spondylopathy, lumbar region: Secondary | ICD-10-CM | POA: Diagnosis not present

## 2017-08-22 DIAGNOSIS — F32A Depression, unspecified: Secondary | ICD-10-CM

## 2017-08-22 DIAGNOSIS — R300 Dysuria: Secondary | ICD-10-CM | POA: Diagnosis not present

## 2017-08-22 DIAGNOSIS — E669 Obesity, unspecified: Secondary | ICD-10-CM | POA: Diagnosis not present

## 2017-08-22 DIAGNOSIS — C50211 Malignant neoplasm of upper-inner quadrant of right female breast: Secondary | ICD-10-CM | POA: Diagnosis not present

## 2017-08-22 LAB — URINALYSIS, ROUTINE W REFLEX MICROSCOPIC
BILIRUBIN URINE: NEGATIVE
HGB URINE DIPSTICK: NEGATIVE
KETONES UR: NEGATIVE
Nitrite: NEGATIVE
PH: 7 (ref 5.0–8.0)
Specific Gravity, Urine: 1.01 (ref 1.000–1.030)
URINE GLUCOSE: NEGATIVE
Urobilinogen, UA: 0.2 (ref 0.0–1.0)

## 2017-08-22 MED ORDER — LOSARTAN POTASSIUM 100 MG PO TABS: 100.0000 mg | ORAL_TABLET | Freq: Every day | ORAL | 2 refills | Status: DC

## 2017-08-22 MED ORDER — OMEPRAZOLE 20 MG PO CPDR: DELAYED_RELEASE_CAPSULE | ORAL | 2 refills | Status: DC

## 2017-08-22 MED ORDER — HYDROCHLOROTHIAZIDE 25 MG PO TABS: ORAL_TABLET | ORAL | 4 refills | Status: DC

## 2017-08-22 MED ORDER — PANTOPRAZOLE SODIUM 40 MG PO TBEC: 40.0000 mg | DELAYED_RELEASE_TABLET | Freq: Every day | ORAL | 2 refills | Status: DC

## 2017-08-22 MED ORDER — PRAVASTATIN SODIUM 20 MG PO TABS: 20.0000 mg | ORAL_TABLET | Freq: Every day | ORAL | 2 refills | Status: DC

## 2017-08-22 MED ORDER — METOPROLOL SUCCINATE ER 25 MG PO TB24: 25.0000 mg | ORAL_TABLET | Freq: Two times a day (BID) | ORAL | 2 refills | Status: DC

## 2017-08-22 NOTE — Patient Instructions (Signed)
Stop omeprazole.  Start protonix 40mg  one per day

## 2017-08-22 NOTE — Progress Notes (Signed)
Patient ID: Wanda Martinez, female   DOB: 1935-07-14, 82 y.o.   MRN: [id number]   Subjective:    Patient ID: Wanda Martinez, female    DOB: [birth year], 82 y.o.   MRN: [id number]  HPI  Patient here for a scheduled follow up.  She reports she is having increased back pain.  Being followed by pain clinic and neurosurgery.  S/p nerve block.  Did not help.  Has f/u 09/2017.  No chest pain.  Breathing stable.  No acid reflux.  No significant abdominal pain.  Some vaginal discomfort.  Some burning.  Started taking cranberry pills and drinking juice.  Also drinking lemon water.  Still with some vaginal issues.  Eating and drinking well.  No vomiting.  Sees Dr Molly Maduro.  Planning for f/u bone density.     Past Medical History:  Diagnosis Date  . Anemia   . Arm pain 07/26/2015  . Arthritis   . Arthritis, degenerative 03/26/2014  . Back pain 11/01/2013  . Breast cancer ([place])    Masectomy - left - 1986   . Breast cancer [facility])    Mastectomy-right -2014  . CHEST PAIN 04/29/2010   Qualifier: Diagnosis of  By: Wynetta Emery RN, Doroteo Bradford    . Chronic cystitis   . Cystocele 02/22/2013  . Cystocele, midline 08/19/2013  . Degeneration of intervertebral disc of lumbosacral region 03/26/2014  . DYSPNEA 04/29/2010   Qualifier: Diagnosis of  By: Wynetta Emery RN, Doroteo Bradford    . Enthesopathy of hip 03/26/2014  . GERD (gastroesophageal reflux disease)   . Hiatal hernia   . HTN (hypertension)   . LBP (low back pain) 03/26/2014  . Neck pain 11/01/2013  . Parkinson disease ([place])   . Sinusitis 02/07/2015  . Skin lesions 07/12/2014  . Urinary incontinence    mixed    Past Surgical History:  Procedure Laterality Date  . ABDOMINAL HYSTERECTOMY    . BREAST BIOPSY  2013  . BREAST ENHANCEMENT SURGERY  1987  . BREAST IMPLANT REMOVAL    . BREAST IMPLANT REMOVAL Right 08/29/2012  . BREAST SURGERY  1986   s/p left mastectomy  . COLONOSCOPY WITH PROPOFOL N/A 09/13/2016   Procedure: COLONOSCOPY WITH PROPOFOL;  Surgeon: Manya Silvas, MD;  Location: [facility] ENDOSCOPY;  Service: Endoscopy;  Laterality: N/A;  . MASTECTOMY  08/2012   right  . TONSILLECTOMY AND ADENOIDECTOMY  79  . VESICOVAGINAL FISTULA CLOSURE W/ TAH  1983   Family History  Problem Relation Age of Onset  . Diabetes Father   . Stroke Father   . Colon polyps Father   . Stroke Mother   . Parkinson's disease Mother    Social History   Social History  . Marital status: Married    Spouse name: N/A  . Number of children: 3  . Years of education: N/A   Social History Main Topics  . Smoking status: Never Smoker  . Smokeless tobacco: Never Used     Comment: tobacco use - no  . Alcohol use No  . Drug use: No  . Sexual activity: No   Other Topics Concern  . None   Social History Narrative  . None    Outpatient Encounter Prescriptions as of 08/22/2017  Medication Sig  . amLODipine (NORVASC) 2.5 MG tablet TAKE 1 TABLET BY MOUTH ONCE DAILY.  Marland Kitchen aspirin 81 MG EC tablet Take 81 mg by mouth daily as needed.    . Calcium Carbonate (CALCIUM 600) 1500 MG TABS Take 1 tablet by  mouth daily.    . carbidopa-levodopa (SINEMET IR) 25-100 MG tablet Take by mouth.  . citalopram (CELEXA) 20 MG tablet TAKE 1 & 1/2 TABLETS BY MOUTH ONCE DAILY  . cyclobenzaprine (FLEXERIL) 10 MG tablet Take 1 tablet (10 mg total) by mouth at bedtime.  . gabapentin (NEURONTIN) 300 MG capsule Take 1 capsule (300 mg total) by mouth 2 (two) times daily.  Marland Kitchen GLUCOSAMINE SULFATE PO Take by mouth daily.   . hydrochlorothiazide (HYDRODIURIL) 25 MG tablet TAKE 1 TABLET BY MOUTH ONCE DAILY. (PATIENT TAKE 1/2 ONCE DAILY AND EXTRA 1/2 IF NEEDED)  . HYDROcodone-acetaminophen (NORCO/VICODIN) 5-325 MG tablet Take 1 tablet by mouth every 8 (eight) hours as needed for severe pain.  Derrill Memo ON 08/27/2017] HYDROcodone-acetaminophen (NORCO/VICODIN) 5-325 MG tablet Take 1 tablet by mouth every 8 (eight) hours as needed for severe pain.  Derrill Memo ON 09/26/2017] HYDROcodone-acetaminophen (NORCO/VICODIN)  5-325 MG tablet Take 1 tablet by mouth every 8 (eight) hours as needed for severe pain.  Marland Kitchen letrozole (FEMARA) 2.5 MG tablet TAKE 1 TABLET BY MOUTH ONCE DAILY.  Marland Kitchen losartan (COZAAR) 100 MG tablet Take 1 tablet (100 mg total) by mouth daily.  Marland Kitchen lubiprostone (AMITIZA) 24 MCG capsule TAKE 1 CAPSULE BY MOUTH ONCE DAILY WITH BREAKFAST.  . magnesium oxide (MAGNESIUM-OXIDE) 400 (241.3 Mg) MG tablet Take 1 tablet (400 mg total) by mouth daily.  . meloxicam (MOBIC) 7.5 MG tablet Take 1 tablet (7.5 mg total) by mouth daily.  . metoprolol succinate (TOPROL-XL) 25 MG 24 hr tablet Take 1 tablet (25 mg total) by mouth 2 (two) times daily.  . Multiple Vitamin (MULTIVITAMIN) tablet Take 1 tablet by mouth daily.  . Omega-3 1000 MG CAPS Take by mouth daily.   . pravastatin (PRAVACHOL) 20 MG tablet Take 1 tablet (20 mg total) by mouth daily.  . [DISCONTINUED] hydrochlorothiazide (HYDRODIURIL) 25 MG tablet TAKE 1 TABLET BY MOUTH ONCE DAILY. (PATIENT TAKE 1/2 ONCE DAILY AND EXTRA 1/2 IF NEEDED)  . [DISCONTINUED] losartan (COZAAR) 100 MG tablet TAKE 1 TABLET BY MOUTH ONCE DAILY.  . [DISCONTINUED] metoprolol succinate (TOPROL-XL) 25 MG 24 hr tablet TAKE 1 TABLET BY MOUTH TWICE DAILY  . [DISCONTINUED] omeprazole (PRILOSEC) 20 MG capsule TAKE 1 CAPSULE BY MOUTH TWICE DAILY FOR REFLUX.  . [DISCONTINUED] omeprazole (PRILOSEC) 20 MG capsule TAKE 1 CAPSULE BY MOUTH TWICE DAILY FOR REFLUX.  . [DISCONTINUED] pravastatin (PRAVACHOL) 20 MG tablet TAKE 1 TABLET BY MOUTH ONCE DAILY.  . pantoprazole (PROTONIX) 40 MG tablet Take 1 tablet (40 mg total) by mouth daily.   No facility-administered encounter medications on file as of 08/22/2017.     Review of Systems  Constitutional: Negative for appetite change and unexpected weight change.  HENT: Negative for congestion and sinus pressure.   Respiratory: Negative for cough, chest tightness and shortness of breath.   Cardiovascular: Negative for chest pain, palpitations and leg  swelling.  Gastrointestinal: Negative for abdominal pain, diarrhea, nausea and vomiting.  Genitourinary:       Vaginal burning as outlined.  Some pressure.    Musculoskeletal: Positive for back pain. Negative for joint swelling.  Skin: Negative for color change and rash.  Neurological: Negative for dizziness, light-headedness and headaches.  Psychiatric/Behavioral: Negative for agitation and dysphoric mood.       Objective:    Physical Exam  Constitutional: She appears well-developed and well-nourished. No distress.  HENT:  Nose: Nose normal.  Mouth/Throat: Oropharynx is clear and moist.  Neck: Neck supple. No thyromegaly present.  Cardiovascular:  Normal rate and regular rhythm.   Pulmonary/Chest: Breath sounds normal. No respiratory distress. She has no wheezes.  Abdominal: Soft. Bowel sounds are normal. There is no tenderness.  Genitourinary:  Genitourinary Comments: Normal external genitalia.  Vaginal vault without lesions.  KOH/wet prep obtained.  Could not appreciate any adnexal masses or tenderness.    Musculoskeletal: She exhibits no edema or tenderness.  Lymphadenopathy:    She has no cervical adenopathy.  Skin: No rash noted. No erythema.  Psychiatric: She has a normal mood and affect. Her behavior is normal.    BP 122/82   Pulse 71   Temp 98.6 F (37 C) (Oral)   Resp 14   Ht 5\' 4"  (1.626 m)   Wt 176 lb 9.6 oz (80.1 kg)   LMP 12/18/1981   SpO2 98%   BMI 30.31 kg/m  Wt Readings from Last 3 Encounters:  08/22/17 176 lb 9.6 oz (80.1 kg)  08/09/17 175 lb (79.4 kg)  07/23/17 180 lb (81.6 kg)     Lab Results  Component Value Date   WBC 5.6 10/18/2016   HGB 12.3 10/18/2016   HCT 36.6 10/18/2016   PLT 244.0 10/18/2016   GLUCOSE 86 05/03/2017   CHOL 171 05/03/2017   TRIG 186.0 (H) 05/03/2017   HDL 41.10 05/03/2017   LDLDIRECT 100.0 10/18/2016   LDLCALC 93 05/03/2017   ALT 20 05/03/2017   AST 23 05/03/2017   NA 136 05/03/2017   K 4.2 05/03/2017   CL 99  05/03/2017   CREATININE 0.98 05/03/2017   BUN 17 05/03/2017   CO2 31 05/03/2017   TSH 3.16 05/03/2017    Dg C-arm 1-60 Min-no Report  Result Date: 08/09/2017 Fluoroscopy was utilized by the requesting physician.  No radiographic interpretation.       Assessment & Plan:   Problem List Items Addressed This Visit    Clinical depression    On citalopram.  Stable.        Dysuria - Primary    Check urin to confirm if infection present.        Relevant Orders   Urinalysis, Routine w reflex microscopic (Completed)   Urine Culture (Completed)   Essential hypertension    Blood pressure on recheck better.  Same medication regimen.  Follow pressures.  Follow metabolic panel.        Relevant Medications   hydrochlorothiazide (HYDRODIURIL) 25 MG tablet   losartan (COZAAR) 100 MG tablet   metoprolol succinate (TOPROL-XL) 25 MG 24 hr tablet   pravastatin (PRAVACHOL) 20 MG tablet   Other Relevant Orders   CBC with Differential/Platelet   Basic metabolic panel   Hypercholesterolemia    On pravastatin.  Low cholesterol diet and exercise.  Follow lipid panel and liver function tests.      Relevant Medications   hydrochlorothiazide (HYDRODIURIL) 25 MG tablet   losartan (COZAAR) 100 MG tablet   metoprolol succinate (TOPROL-XL) 25 MG 24 hr tablet   pravastatin (PRAVACHOL) 20 MG tablet   Other Relevant Orders   Lipid panel   Hepatic function panel   Lumbar facet syndrome (Bilateral) (R>L) (Chronic)    Being followed by pain clinic and neurosurgery.        Obesity (BMI 30-39.9)    Discussed diet and exercise.  Follow.       Parkinsons ([place])    Followed by neurology.  Stable.        Primary cancer of upper inner quadrant of right female breast [facility])    S/p  bilateral mastectomy.  On letrozole.  Followed by Dr Grayland Ormond.        Tremor    Followed by neurology.        Other Visit Diagnoses    Vaginal discharge       with the burning as outlined.  KOH/wet prep sent.     Relevant Orders   Cervicovaginal ancillary only (Completed)       Einar Pheasant, MD

## 2017-08-23 LAB — CERVICOVAGINAL ANCILLARY ONLY: WET PREP (BD AFFIRM): NEGATIVE

## 2017-08-24 ENCOUNTER — Other Ambulatory Visit: Payer: Self-pay | Admitting: Nurse Practitioner

## 2017-08-24 DIAGNOSIS — M792 Neuralgia and neuritis, unspecified: Secondary | ICD-10-CM

## 2017-08-25 ENCOUNTER — Encounter: Payer: Self-pay | Admitting: Internal Medicine

## 2017-08-25 LAB — URINE CULTURE
MICRO NUMBER: 80972838
SPECIMEN QUALITY:: ADEQUATE

## 2017-08-25 NOTE — Assessment & Plan Note (Signed)
Being followed by pain clinic and neurosurgery.

## 2017-08-25 NOTE — Assessment & Plan Note (Signed)
On pravastatin.  Low cholesterol diet and exercise.  Follow lipid panel and liver function tests.   

## 2017-08-25 NOTE — Assessment & Plan Note (Signed)
On citalopram.  Stable.   

## 2017-08-25 NOTE — Assessment & Plan Note (Signed)
Followed by neurology.  Stable  

## 2017-08-25 NOTE — Assessment & Plan Note (Signed)
Discussed diet and exercise.  Follow.  

## 2017-08-25 NOTE — Assessment & Plan Note (Signed)
Followed by neurology.   

## 2017-08-25 NOTE — Assessment & Plan Note (Signed)
Check urin to confirm if infection present.

## 2017-08-25 NOTE — Assessment & Plan Note (Signed)
S/p bilateral mastectomy.  On letrozole.  Followed by Dr Grayland Ormond.

## 2017-08-25 NOTE — Assessment & Plan Note (Signed)
Blood pressure on recheck better.  Same medication regimen.  Follow pressures.  Follow metabolic panel.  

## 2017-08-27 ENCOUNTER — Other Ambulatory Visit: Payer: Self-pay

## 2017-08-27 ENCOUNTER — Other Ambulatory Visit: Payer: Self-pay | Admitting: Internal Medicine

## 2017-08-27 DIAGNOSIS — M159 Polyosteoarthritis, unspecified: Secondary | ICD-10-CM

## 2017-08-27 DIAGNOSIS — M150 Primary generalized (osteo)arthritis: Principal | ICD-10-CM

## 2017-08-27 MED ORDER — CEFDINIR 300 MG PO CAPS: 300.0000 mg | ORAL_CAPSULE | Freq: Two times a day (BID) | ORAL | 0 refills | Status: DC

## 2017-08-29 ENCOUNTER — Telehealth: Payer: Self-pay | Admitting: Nurse Practitioner

## 2017-08-29 NOTE — Telephone Encounter (Signed)
Called to pharmacy and checked Rx, patient has enough medication to last until 09/26/17 and Lytle Michaels stated he would take care of the fill.

## 2017-08-29 NOTE — Telephone Encounter (Addendum)
Patient has called pharmacy for refill on meloxicam and they called here to check on refill. I did inform pharmacy patient has to have appt for refill and they said they would let her know. Was she supposed to have this called to pharmacy at last refill visit?  Patient next visit is 09-10-17 for procedure follow up and 10-17 for med refill  Please let patient and pharmacy know

## 2017-09-09 NOTE — Progress Notes (Signed)
Patient's Name: Wanda Martinez  MRN: [id number]  Referring Provider: Einar Pheasant, MD  DOB: 11-Nov-1935  PCP: Einar Pheasant, MD  DOS: 09/10/2017  Note by: Gaspar Cola, MD  Service setting: Ambulatory outpatient  Specialty: Interventional Pain Management  Location: [facility] (AMB) Pain Management Facility    Patient type: Established   Primary Reason(s) for Visit: Encounter for post-procedure evaluation of chronic illness with mild to moderate exacerbation CC: Back Pain (lower right)  HPI  Wanda Martinez is a 82 y.o. year old, female patient, who comes today for a post-procedure evaluation. She has Hypercholesterolemia; Essential hypertension; Frequent UTI; Primary cancer of upper inner quadrant of right female breast ([place]); Tremor; Toenail fungus; Headache; Obesity (BMI 30-39.9); Dysuria; Parkinsons ([place]); Fatigue; Chronic kidney disease (CKD), stage II (mild); Clinical depression; Incomplete bladder emptying; Mixed incontinence; HLD (hyperlipidemia); Bladder infection, chronic; Parkinson's disease ([place]); Pure hypercholesterolemia; Hernia, rectovaginal; Long term current use of opiate analgesic; Long term prescription opiate use; Opiate use; Encounter for therapeutic drug level monitoring; Encounter for chronic pain management; Chronic low back pain (Primary Source of Pain) (Bilateral) (R>L); Lumbar spondylosis; Chronic hip pain; Chronic neck pain; Cervical spondylosis; Chronic cervical radicular pain (Right); Chronic upper extremity pain (Right); Osteoarthritis; Diffuse myofascial pain syndrome; Neurogenic pain; Chronic upper back pain (Right); Myofascial pain syndrome (Right) (cervicothoracic); Vaginitis; Lumbar facet syndrome (Bilateral) (R>L); Malignant neoplasm of right female breast ([place]); Chronic constipation; Cervical facet hypertrophy; Cervical facet syndrome ([place]); Chronic shoulder pain (Right); Chronic pain syndrome; Chronic sacroiliac joint pain (Left); and Chronic right sacroiliac joint  pain on her problem list. Her primarily concern today is the Back Pain (lower right)  Pain Assessment: Location: Lower, Right Back Radiating: can feel the pain some in the thigh area, but not as much, more into the hip and upper thigh area Onset: More than a month ago Duration: Chronic pain Quality: Aching, Constant, Radiating Severity: 6 /10 (self-reported pain score)  Note: Reported level is inconsistent with clinical observations. Clinically the patient looks like a 3/10       Pain level appears to be "Moderate", defined here as significantly interfering with activities of daily living (ADL). It becomes difficult to feed, bathe, get dressed, get on and off the toilet or to perform personal hygiene functions. Difficult to get in and out of bed or a chair without assistance. Very distracting. With effort, it can be ignored when deeply involved in activities. Effect on ADL: normally she is able to get her ADL's and housework done.  Timing: Constant Modifying factors: medications,  (patient reports a pain flare this month that was very difficult to manage.  laying down on her left side with heat and medication sometimes helped the pain ease off. )  Wanda Martinez comes in today for post-procedure evaluation after the treatment done on 08/09/2017.  Further details on both, my assessment(s), as well as the proposed treatment plan, please see below.  Post-Procedure Assessment  08/09/2017 Procedure: Diagnostic bilateral sided lumbar facet + bilateral sacroiliac joint block under fluoroscopic guidance and IV sedation. Pre-procedure pain score:  8/10 Post-procedure pain score: 0/10 (100% relief) Influential Factors: BMI: 30.90 kg/m Intra-procedural challenges: None observed.         Assessment challenges: None detected.              Reported side-effects: None.        Post-procedural adverse reactions or complications: None reported         Sedation: Sedation provided. When no sedatives are used,  the analgesic  levels obtained are directly associated to the effectiveness of the local anesthetics. However, when sedation is provided, the level of analgesia obtained during the initial 1 hour following the intervention, is believed to be the result of a combination of factors. These factors may include, but are not limited to: 1. The effectiveness of the local anesthetics used. 2. The effects of the analgesic(s) and/or anxiolytic(s) used. 3. The degree of discomfort experienced by the patient at the time of the procedure. 4. The patients ability and reliability in recalling and recording the events. 5. The presence and influence of possible secondary gains and/or psychosocial factors. Reported result: Relief experienced during the 1st hour after the procedure: 100 % (Ultra-Short Term Relief)            Interpretative annotation: Clinically appropriate result. Analgesia during this period is likely to be Local Anesthetic and/or IV Sedative (Analgesic/Anxiolytic) related.          Effects of local anesthetic: The analgesic effects attained during this period are directly associated to the localized infiltration of local anesthetics and therefore cary significant diagnostic value as to the etiological location, or anatomical origin, of the pain. Expected duration of relief is directly dependent on the pharmacodynamics of the local anesthetic used. Long-acting (4-6 hours) anesthetics used.  Reported result: Relief during the next 4 to 6 hour after the procedure: 90 % (Short-Term Relief)            Interpretative annotation: Clinically appropriate result. Analgesia during this period is likely to be Local Anesthetic-related.          Long-term benefit: Defined as the period of time past the expected duration of local anesthetics (1 hour for short-acting and 4-6 hours for long-acting). With the possible exception of prolonged sympathetic blockade from the local anesthetics, benefits during this period are  typically attributed to, or associated with, other factors such as analgesic sensory neuropraxia, antiinflammatory effects, or beneficial biochemical changes provided by agents other than the local anesthetics.  Reported result: Extended relief following procedure: 20 % (Long-Term Relief)            Interpretative annotation: Clinically appropriate result. Good relief. No permanent benefit expected. Limited inflammation. Possible mechanical aggravating factors.          Current benefits: Defined as reported results that persistent at this point in time.   Analgesia: <25 %            Function: Somewhat improved ROM: Somewhat improved Interpretative annotation: Recurrence of symptoms. No permanent benefit expected. Effective diagnostic intervention.          Interpretation: Results would suggest a successful diagnostic intervention.                  Plan:  Please see "Plan of Care" for details.       Laboratory Chemistry  Inflammation Markers (CRP: Acute Phase) (ESR: Chronic Phase) No results found for: CRP, ESRSEDRATE               Renal Function Markers Lab Results  Component Value Date   BUN 17 05/03/2017   CREATININE 0.98 05/03/2017   GFRAA >60 10/08/2014   GFRNONAA >60 10/08/2014                 Hepatic Function Markers Lab Results  Component Value Date   AST 23 05/03/2017   ALT 20 05/03/2017   ALBUMIN 4.4 05/03/2017   ALKPHOS 53 05/03/2017  Electrolytes Lab Results  Component Value Date   NA 136 05/03/2017   K 4.2 05/03/2017   CL 99 05/03/2017   CALCIUM 9.8 05/03/2017   MG 1.8 04/21/2013                 Neuropathy Markers Lab Results  Component Value Date   BTDVVOHY07 371 02/01/2016                 Bone Pathology Markers Lab Results  Component Value Date   ALKPHOS 53 05/03/2017   CALCIUM 9.8 05/03/2017                 Coagulation Parameters Lab Results  Component Value Date   PLT 244.0 10/18/2016                 Cardiovascular  Markers Lab Results  Component Value Date   HGB 12.3 10/18/2016   HCT 36.6 10/18/2016                 Note: Lab results reviewed.  Recent Diagnostic Imaging Results  DG C-Arm 1-60 Min-No Report Fluoroscopy was utilized by the requesting physician.  No radiographic  interpretation.   Note: Imaging results reviewed.          Meds   Current Outpatient Prescriptions:  .  amLODipine (NORVASC) 2.5 MG tablet, TAKE 1 TABLET BY MOUTH ONCE DAILY., Disp: 30 tablet, Rfl: 2 .  aspirin 81 MG EC tablet, Take 81 mg by mouth daily as needed.  , Disp: , Rfl:  .  Calcium Carbonate (CALCIUM 600) 1500 MG TABS, Take 1 tablet by mouth daily.  , Disp: , Rfl:  .  carbidopa-levodopa (SINEMET IR) 25-100 MG tablet, Take by mouth., Disp: , Rfl:  .  citalopram (CELEXA) 20 MG tablet, TAKE 1 & 1/2 TABLETS BY MOUTH ONCE DAILY, Disp: , Rfl:  .  cyclobenzaprine (FLEXERIL) 10 MG tablet, Take 1 tablet (10 mg total) by mouth at bedtime., Disp: 90 tablet, Rfl: 0 .  gabapentin (NEURONTIN) 300 MG capsule, Take 1 capsule (300 mg total) by mouth 2 (two) times daily., Disp: 180 capsule, Rfl: 0 .  GLUCOSAMINE SULFATE PO, Take by mouth daily. , Disp: , Rfl:  .  hydrochlorothiazide (HYDRODIURIL) 25 MG tablet, TAKE 1 TABLET BY MOUTH ONCE DAILY. (PATIENT TAKE 1/2 ONCE DAILY AND EXTRA 1/2 IF NEEDED), Disp: 15 tablet, Rfl: 4 .  HYDROcodone-acetaminophen (NORCO/VICODIN) 5-325 MG tablet, Take 1 tablet by mouth every 8 (eight) hours as needed for severe pain., Disp: 90 tablet, Rfl: 0 .  [START ON 09/26/2017] HYDROcodone-acetaminophen (NORCO/VICODIN) 5-325 MG tablet, Take 1 tablet by mouth every 8 (eight) hours as needed for severe pain., Disp: 90 tablet, Rfl: 0 .  letrozole (FEMARA) 2.5 MG tablet, TAKE 1 TABLET BY MOUTH ONCE DAILY., Disp: 90 tablet, Rfl: 3 .  losartan (COZAAR) 100 MG tablet, Take 1 tablet (100 mg total) by mouth daily., Disp: 30 tablet, Rfl: 2 .  lubiprostone (AMITIZA) 24 MCG capsule, TAKE 1 CAPSULE BY MOUTH ONCE DAILY  WITH BREAKFAST., Disp: , Rfl:  .  magnesium oxide (MAGNESIUM-OXIDE) 400 (241.3 Mg) MG tablet, Take 1 tablet (400 mg total) by mouth daily., Disp: 90 tablet, Rfl: 0 .  meloxicam (MOBIC) 7.5 MG tablet, Take 1 tablet (7.5 mg total) by mouth daily., Disp: 90 tablet, Rfl: 0 .  metoprolol succinate (TOPROL-XL) 25 MG 24 hr tablet, Take 1 tablet (25 mg total) by mouth 2 (two) times daily., Disp: 60 tablet, Rfl: 2 .  Multiple Vitamin (MULTIVITAMIN) tablet, Take 1 tablet by mouth daily., Disp: , Rfl:  .  Omega-3 1000 MG CAPS, Take by mouth daily. , Disp: , Rfl:  .  pantoprazole (PROTONIX) 40 MG tablet, Take 1 tablet (40 mg total) by mouth daily., Disp: 30 tablet, Rfl: 2 .  pravastatin (PRAVACHOL) 20 MG tablet, Take 1 tablet (20 mg total) by mouth daily., Disp: 30 tablet, Rfl: 2 .  HYDROcodone-acetaminophen (NORCO/VICODIN) 5-325 MG tablet, Take 1 tablet by mouth every 8 (eight) hours as needed for severe pain., Disp: 90 tablet, Rfl: 0  ROS  Constitutional: Denies any fever or chills Gastrointestinal: No reported hemesis, hematochezia, vomiting, or acute GI distress Musculoskeletal: Denies any acute onset joint swelling, redness, loss of ROM, or weakness Neurological: No reported episodes of acute onset apraxia, aphasia, dysarthria, agnosia, amnesia, paralysis, loss of coordination, or loss of consciousness  Allergies  Ms. Kiker is allergic to sulfa antibiotics and vesicare [solifenacin].  [place]becca  Drug: Ms. Parlier  reports that she does not use drugs. Alcohol:  reports that she does not drink alcohol. Tobacco:  reports that she has never smoked. She has never used smokeless tobacco. Medical:  has a past medical history of Anemia; Arm pain (07/26/2015); Arthritis; Arthritis, degenerative (03/26/2014); Back pain (11/01/2013); Breast cancer ([place]); Breast cancer ([place]); CHEST PAIN (04/29/2010); Chronic cystitis; Cystocele (02/22/2013); Cystocele, midline (08/19/2013); Degeneration of intervertebral disc of lumbosacral  region (03/26/2014); DYSPNEA (04/29/2010); Enthesopathy of hip (03/26/2014); GERD (gastroesophageal reflux disease); Hiatal hernia; HTN (hypertension); LBP (low back pain) (03/26/2014); Neck pain (11/01/2013); Parkinson disease ([place]); Sinusitis (02/07/2015); Skin lesions (07/12/2014); and Urinary incontinence. Surgical: Ms. Jaimes  has a past surgical history that includes Breast biopsy (2013); Tonsillectomy and adenoidectomy (81); Vesicovaginal fistula closure w/ TAH (1983); Breast surgery (1986); Breast enhancement surgery (1987); Breast implant removal; Breast implant removal (Right, 08/29/2012); Mastectomy (08/2012); Abdominal hysterectomy; and Colonoscopy with propofol (N/A, 09/13/2016). Family: family history includes Colon polyps in her father; Diabetes in her father; Parkinson's disease in her mother; Stroke in her father and mother.  Constitutional Exam  General appearance: Well nourished, well developed, and well hydrated. In no apparent acute distress Vitals:   09/10/17 0906  BP: (!) 156/83  Pulse: 67  Resp: 16  Temp: 98.3 F (36.8 C)  TempSrc: Oral  SpO2: 99%  Weight: 180 lb (81.6 kg)  Height: _0  (1.626 m)   BMI Assessment: Estimated body mass index is 30.9 kg/m as calculated from the following:   Height as of this encounter: _1  (1.626 m).   Weight as of this encounter: 180 lb (81.6 kg).  BMI interpretation table: BMI level Category Range association with higher incidence of chronic pain  <18 kg/m2 Underweight   18.5-24.9 kg/m2 Ideal body weight   25-29.9 kg/m2 Overweight Increased incidence by 20%  30-34.9 kg/m2 Obese (Class I) Increased incidence by 68%  35-39.9 kg/m2 Severe obesity (Class II) Increased incidence by 136%  >40 kg/m2 Extreme obesity (Class III) Increased incidence by 254%   BMI Readings from Last 4 Encounters:  09/10/17 30.90 kg/m  08/22/17 30.31 kg/m  08/09/17 30.04 kg/m  07/23/17 30.90 kg/m   Wt Readings from Last 4 Encounters:  09/10/17 180 lb  (81.6 kg)  08/22/17 176 lb 9.6 oz (80.1 kg)  08/09/17 175 lb (79.4 kg)  07/23/17 180 lb (81.6 kg)  Psych/Mental status: Alert, oriented x 3 (person, place, & time)       Eyes: PERLA Respiratory: No evidence of acute respiratory distress  Cervical Spine Area  Exam  Skin & Axial Inspection: No masses, redness, edema, swelling, or associated skin lesions Alignment: Symmetrical Functional ROM: Unrestricted ROM      Stability: No instability detected Muscle Tone/Strength: Functionally intact. No obvious neuro-muscular anomalies detected. Sensory (Neurological): Unimpaired Palpation: No palpable anomalies              Upper Extremity (UE) Exam    Side: Right upper extremity  Side: Left upper extremity  Skin & Extremity Inspection: Skin color, temperature, and hair growth are WNL. No peripheral edema or cyanosis. No masses, redness, swelling, asymmetry, or associated skin lesions. No contractures.  Skin & Extremity Inspection: Skin color, temperature, and hair growth are WNL. No peripheral edema or cyanosis. No masses, redness, swelling, asymmetry, or associated skin lesions. No contractures.  Functional ROM: Unrestricted ROM          Functional ROM: Unrestricted ROM          Muscle Tone/Strength: Functionally intact. No obvious neuro-muscular anomalies detected.  Muscle Tone/Strength: Functionally intact. No obvious neuro-muscular anomalies detected.  Sensory (Neurological): Unimpaired          Sensory (Neurological): Unimpaired          Palpation: No palpable anomalies              Palpation: No palpable anomalies              Specialized Test(s): Deferred         Specialized Test(s): Deferred          Thoracic Spine Area Exam  Skin & Axial Inspection: No masses, redness, or swelling Alignment: Symmetrical Functional ROM: Unrestricted ROM Stability: No instability detected Muscle Tone/Strength: Functionally intact. No obvious neuro-muscular anomalies detected. Sensory (Neurological):  Unimpaired Muscle strength & Tone: No palpable anomalies  Lumbar Spine Area Exam  Skin & Axial Inspection: No masses, redness, or swelling Alignment: Symmetrical Functional ROM: Decreased ROM      Stability: No instability detected Muscle Tone/Strength: Functionally intact. No obvious neuro-muscular anomalies detected. Sensory (Neurological): Movement-associated pain Palpation: Complains of area being tender to palpation       Provocative Tests: Lumbar Hyperextension and rotation test: Positive bilaterally for facet joint pain. Lumbar Lateral bending test: evaluation deferred today       Patrick's Maneuver: Positive for bilateral S-I arthralgia              Gait & Posture Assessment  Ambulation: Unassisted Gait: Relatively normal for age and body habitus Posture: WNL   Lower Extremity Exam    Side: Right lower extremity  Side: Left lower extremity  Skin & Extremity Inspection: Skin color, temperature, and hair growth are WNL. No peripheral edema or cyanosis. No masses, redness, swelling, asymmetry, or associated skin lesions. No contractures.  Skin & Extremity Inspection: Skin color, temperature, and hair growth are WNL. No peripheral edema or cyanosis. No masses, redness, swelling, asymmetry, or associated skin lesions. No contractures.  Functional ROM: Unrestricted ROM          Functional ROM: Unrestricted ROM          Muscle Tone/Strength: Functionally intact. No obvious neuro-muscular anomalies detected.  Muscle Tone/Strength: Functionally intact. No obvious neuro-muscular anomalies detected.  Sensory (Neurological): Unimpaired  Sensory (Neurological): Unimpaired  Palpation: No palpable anomalies  Palpation: No palpable anomalies   Assessment  Primary Diagnosis & Pertinent Problem List: The primary encounter diagnosis was Chronic low back pain (Primary Source of Pain) (Bilateral) (R>L). Diagnoses of Lumbar facet syndrome (Bilateral) (R>L), Chronic right sacroiliac joint  pain,  Parkinson's disease ([place]), Chronic pain syndrome, and Opiate use were also pertinent to this visit.  Status Diagnosis  Controlled Controlled Controlled 1. Chronic low back pain (Primary Source of Pain) (Bilateral) (R>L)   2. Lumbar facet syndrome (Bilateral) (R>L)   3. Chronic right sacroiliac joint pain   4. Parkinson's disease ([place])   5. Chronic pain syndrome   6. Opiate use     Problems updated and reviewed during this visit: Problem  Chronic Right Sacroiliac Joint Pain  Parkinson's Disease ([facility])   Plan of Care  Pharmacotherapy (Medications Ordered): No orders of the defined types were placed in this encounter.  New Prescriptions   No medications on file   Medications administered today: Ms. Fickling had no medications administered during this visit.   Procedure Orders     Radiofrequency,Lumbar     Radiofrequency Sacroiliac Joint Lab Orders  No laboratory test(s) ordered today   Imaging Orders  No imaging studies ordered today   Referral Orders  No referral(s) requested today    Interventional management options: Planned, scheduled, and/or pending:   Therapeutic bilateral lumbar facet + bilateral sacroiliac joint RFA, Under fluoroscopic guidance and IV sedation. We will start with the right side.    Considering:   Diagnostic right-sided lumbar facet block  Palliative right-sided lumbar facet radiofrequency ablation  Palliative Right suprascapular muscle trigger point injection  Diagnostic bilateral cervical facet block  Possible bilateral cervical facet radiofrequency ablation  Diagnostic right-sided cervical epidural steroid injection  Diagnostic intra-articular hip joint injection  Possible hip joint radiofrequency ablation    Palliative PRN treatment(s):   Palliative bilateral lumbar facet block + bilateral sacroiliac joint block    Provider-requested follow-up: Return for RFA procedure under fluoro and sedation: (R) L-FCT + SI RFA.  Future  Appointments Date Time Provider [place]  09/20/2017 10:00 AM [facility] None  10/03/2017 2:00 PM Milinda Pointer, MD [facility] None  10/09/2017 1:45 PM Lloyd Huger, MD [facility] None  10/25/2017 10:30 AM Milinda Pointer, MD [facility] None  10/26/2017 1:00 PM O'Brien-Blaney, Denisa L, LPN [facility] None  35/33/1740 11:30 AM Einar Pheasant, MD [facility] None   Primary Care Physician: Einar Pheasant, MD Location: [facility] Note by: Gaspar Cola, MD Date: 09/10/2017; Time: 10:01 AM

## 2017-09-10 ENCOUNTER — Ambulatory Visit: Payer: Medicare Other | Attending: Pain Medicine | Admitting: Pain Medicine

## 2017-09-10 ENCOUNTER — Encounter: Payer: Self-pay | Admitting: Pain Medicine

## 2017-09-10 VITALS — BP 156/83 | HR 67 | Temp 98.3°F | Resp 16 | Ht 64.0 in | Wt 180.0 lb

## 2017-09-10 DIAGNOSIS — K219 Gastro-esophageal reflux disease without esophagitis: Secondary | ICD-10-CM | POA: Diagnosis not present

## 2017-09-10 DIAGNOSIS — I129 Hypertensive chronic kidney disease with stage 1 through stage 4 chronic kidney disease, or unspecified chronic kidney disease: Secondary | ICD-10-CM | POA: Diagnosis not present

## 2017-09-10 DIAGNOSIS — M545 Low back pain, unspecified: Secondary | ICD-10-CM

## 2017-09-10 DIAGNOSIS — Z79899 Other long term (current) drug therapy: Secondary | ICD-10-CM | POA: Insufficient documentation

## 2017-09-10 DIAGNOSIS — E669 Obesity, unspecified: Secondary | ICD-10-CM | POA: Diagnosis not present

## 2017-09-10 DIAGNOSIS — M5412 Radiculopathy, cervical region: Secondary | ICD-10-CM | POA: Insufficient documentation

## 2017-09-10 DIAGNOSIS — D649 Anemia, unspecified: Secondary | ICD-10-CM | POA: Diagnosis not present

## 2017-09-10 DIAGNOSIS — K5909 Other constipation: Secondary | ICD-10-CM | POA: Insufficient documentation

## 2017-09-10 DIAGNOSIS — C50211 Malignant neoplasm of upper-inner quadrant of right female breast: Secondary | ICD-10-CM | POA: Insufficient documentation

## 2017-09-10 DIAGNOSIS — Z823 Family history of stroke: Secondary | ICD-10-CM | POA: Insufficient documentation

## 2017-09-10 DIAGNOSIS — Z791 Long term (current) use of non-steroidal anti-inflammatories (NSAID): Secondary | ICD-10-CM | POA: Insufficient documentation

## 2017-09-10 DIAGNOSIS — M479 Spondylosis, unspecified: Secondary | ICD-10-CM | POA: Diagnosis not present

## 2017-09-10 DIAGNOSIS — G894 Chronic pain syndrome: Secondary | ICD-10-CM | POA: Insufficient documentation

## 2017-09-10 DIAGNOSIS — Z882 Allergy status to sulfonamides status: Secondary | ICD-10-CM | POA: Diagnosis not present

## 2017-09-10 DIAGNOSIS — Z7982 Long term (current) use of aspirin: Secondary | ICD-10-CM | POA: Insufficient documentation

## 2017-09-10 DIAGNOSIS — E7800 Pure hypercholesterolemia, unspecified: Secondary | ICD-10-CM | POA: Diagnosis not present

## 2017-09-10 DIAGNOSIS — K449 Diaphragmatic hernia without obstruction or gangrene: Secondary | ICD-10-CM | POA: Diagnosis not present

## 2017-09-10 DIAGNOSIS — Z79891 Long term (current) use of opiate analgesic: Secondary | ICD-10-CM | POA: Insufficient documentation

## 2017-09-10 DIAGNOSIS — M25511 Pain in right shoulder: Secondary | ICD-10-CM | POA: Diagnosis not present

## 2017-09-10 DIAGNOSIS — G8929 Other chronic pain: Secondary | ICD-10-CM | POA: Diagnosis not present

## 2017-09-10 DIAGNOSIS — M4696 Unspecified inflammatory spondylopathy, lumbar region: Secondary | ICD-10-CM | POA: Diagnosis not present

## 2017-09-10 DIAGNOSIS — R079 Chest pain, unspecified: Secondary | ICD-10-CM | POA: Diagnosis not present

## 2017-09-10 DIAGNOSIS — R32 Unspecified urinary incontinence: Secondary | ICD-10-CM | POA: Insufficient documentation

## 2017-09-10 DIAGNOSIS — Z888 Allergy status to other drugs, medicaments and biological substances status: Secondary | ICD-10-CM | POA: Insufficient documentation

## 2017-09-10 DIAGNOSIS — Z9010 Acquired absence of unspecified breast and nipple: Secondary | ICD-10-CM | POA: Insufficient documentation

## 2017-09-10 DIAGNOSIS — G20 Parkinson's disease: Secondary | ICD-10-CM | POA: Diagnosis not present

## 2017-09-10 DIAGNOSIS — Z820 Family history of epilepsy and other diseases of the nervous system: Secondary | ICD-10-CM | POA: Insufficient documentation

## 2017-09-10 DIAGNOSIS — Z8371 Family history of colonic polyps: Secondary | ICD-10-CM | POA: Insufficient documentation

## 2017-09-10 DIAGNOSIS — M47816 Spondylosis without myelopathy or radiculopathy, lumbar region: Secondary | ICD-10-CM

## 2017-09-10 DIAGNOSIS — F1190 Opioid use, unspecified, uncomplicated: Secondary | ICD-10-CM

## 2017-09-10 DIAGNOSIS — I10 Essential (primary) hypertension: Secondary | ICD-10-CM | POA: Insufficient documentation

## 2017-09-10 DIAGNOSIS — Z9889 Other specified postprocedural states: Secondary | ICD-10-CM | POA: Insufficient documentation

## 2017-09-10 DIAGNOSIS — M533 Sacrococcygeal disorders, not elsewhere classified: Secondary | ICD-10-CM

## 2017-09-10 DIAGNOSIS — Z833 Family history of diabetes mellitus: Secondary | ICD-10-CM | POA: Insufficient documentation

## 2017-09-10 NOTE — Patient Instructions (Addendum)
____________________________________________________________________________________________  Preparing for Procedure with Sedation Instructions: . Oral Intake: Do not eat or drink anything for at least 8 hours prior to your procedure. . Transportation: Public transportation is not allowed. Bring an adult driver. The driver must be physically present in our waiting room before any procedure can be started. . Physical Assistance: Bring an adult physically capable of assisting you, in the event you need help. This adult should keep you company at home for at least 6 hours after the procedure. . Blood Pressure Medicine: Take your blood pressure medicine with a sip of water the morning of the procedure. . Blood thinners:  . Diabetics on insulin: Notify the staff so that you can be scheduled 1st case in the morning. If your diabetes requires high dose insulin, take only  of your normal insulin dose the morning of the procedure and notify the staff that you have done so. . Preventing infections: Shower with an antibacterial soap the morning of your procedure. . Build-up your immune system: Take 1000 mg of Vitamin C with every meal (3 times a day) the day prior to your procedure. . Antibiotics: Inform the staff if you have a condition or reason that requires you to take antibiotics before dental procedures. . Pregnancy: If you are pregnant, call and cancel the procedure. . Sickness: If you have a cold, fever, or any active infections, call and cancel the procedure. . Arrival: You must be in the facility at least 30 minutes prior to your scheduled procedure. . Children: Do not bring children with you. . Dress appropriately: Bring dark clothing that you would not mind if they get stained. . Valuables: Do not bring any jewelry or valuables. Procedure appointments are reserved for interventional treatments only. . No Prescription Refills. . No medication changes will be discussed during procedure  appointments. . No disability issues will be discussed. ____________________________________________________________________________________________  Radiofrequency Lesioning Radiofrequency lesioning is a procedure that is performed to relieve pain. The procedure is often used for back, neck, or arm pain. Radiofrequency lesioning involves the use of a machine that creates radio waves to make heat. During the procedure, the heat is applied to the nerve that carries the pain signal. The heat damages the nerve and interferes with the pain signal. Pain relief usually starts about 2 weeks after the procedure and lasts for 6 months to 1 year. Tell a health care provider about:  Any allergies you have.  All medicines you are taking, including vitamins, herbs, eye drops, creams, and over-the-counter medicines.  Any problems you or family members have had with anesthetic medicines.  Any blood disorders you have.  Any surgeries you have had.  Any medical conditions you have.  Whether you are pregnant or may be pregnant. What are the risks? Generally, this is a safe procedure. However, problems may occur, including:  Pain or soreness at the injection site.  Infection at the injection site.  Damage to nerves or blood vessels.  What happens before the procedure?  Ask your health care provider about: ? Changing or stopping your regular medicines. This is especially important if you are taking diabetes medicines or blood thinners. ? Taking medicines such as aspirin and ibuprofen. These medicines can thin your blood. Do not take these medicines before your procedure if your health care provider instructs you not to.  Follow instructions from your health care provider about eating or drinking restrictions.  Plan to have someone take you home after the procedure.  If you go home   right after the procedure, plan to have someone with you for 24 hours. What happens during the procedure?  You  will be given one or more of the following: ? A medicine to help you relax (sedative). ? A medicine to numb the area (local anesthetic).  You will be awake during the procedure. You will need to be able to talk with the health care provider during the procedure.  With the help of a type of X-ray (fluoroscopy), the health care provider will insert a radiofrequency needle into the area to be treated.  Next, a wire that carries the radio waves (electrode) will be put through the radiofrequency needle. An electrical pulse will be sent through the electrode to verify the correct nerve. You will feel a tingling sensation, and you may have muscle twitching.  Then, the tissue that is around the needle tip will be heated by an electric current that is passed using the radiofrequency machine. This will numb the nerves.  A bandage (dressing) will be put on the insertion area after the procedure is done. The procedure may vary among health care providers and hospitals. What happens after the procedure?  Your blood pressure, heart rate, breathing rate, and blood oxygen level will be monitored often until the medicines you were given have worn off.  Return to your normal activities as directed by your health care provider. This information is not intended to replace advice given to you by your health care provider. Make sure you discuss any questions you have with your health care provider. Document Released: 08/02/2011 Document Revised: 05/11/2016 Document Reviewed: 01/11/2015 [employer] Interactive Patient Education  2018 [employer] GENERAL RISKS AND COMPLICATIONS  What are the risk, side effects and possible complications? Generally speaking, most procedures are safe.  However, with any procedure there are risks, side effects, and the possibility of complications.  The risks and complications are dependent upon the sites that are lesioned, or the type of nerve block to be performed.  The closer the  procedure is to the spine, the more serious the risks are.  Great care is taken when placing the radio frequency needles, block needles or lesioning probes, but sometimes complications can occur. 1. Infection: Any time there is an injection through the skin, there is a risk of infection.  This is why sterile conditions are used for these blocks.  There are four possible types of infection. 1. Localized skin infection. 2. Central Nervous System Infection-This can be in the form of Meningitis, which can be deadly. 3. Epidural Infections-This can be in the form of an epidural abscess, which can cause pressure inside of the spine, causing compression of the spinal cord with subsequent paralysis. This would require an emergency surgery to decompress, and there are no guarantees that the patient would recover from the paralysis. 4. Discitis-This is an infection of the intervertebral discs.  It occurs in about 1% of discography procedures.  It is difficult to treat and it may lead to surgery.        2. Pain: the needles have to go through skin and soft tissues, will cause soreness.       3. Damage to internal structures:  The nerves to be lesioned may be near blood vessels or    other nerves which can be potentially damaged.       4. Bleeding: Bleeding is more common if the patient is taking blood thinners such as  aspirin, Coumadin, Ticiid, Plavix, etc., or if he/she have some   genetic predisposition  such as hemophilia. Bleeding into the spinal canal can cause compression of the spinal  cord with subsequent paralysis.  This would require an emergency surgery to  decompress and there are no guarantees that the patient would recover from the  paralysis.       5. Pneumothorax:  Puncturing of a lung is a possibility, every time a needle is introduced in  the area of the chest or upper back.  Pneumothorax refers to free air around the  collapsed lung(s), inside of the thoracic cavity (chest cavity).  Another two  possible  complications related to a similar event would include: Hemothorax and Chylothorax.   These are variations of the Pneumothorax, where instead of air around the collapsed  lung(s), you may have blood or chyle, respectively.       6. Spinal headaches: They may occur with any procedures in the area of the spine.       7. Persistent CSF (Cerebro-Spinal Fluid) leakage: This is a rare problem, but may occur  with prolonged intrathecal or epidural catheters either due to the formation of a fistulous  track or a dural tear.       8. Nerve damage: By working so close to the spinal cord, there is always a possibility of  nerve damage, which could be as serious as a permanent spinal cord injury with  paralysis.       9. Death:  Although rare, severe deadly allergic reactions known as "Anaphylactic  reaction" can occur to any of the medications used.      10. Worsening of the symptoms:  We can always make thing worse.  What are the chances of something like this happening? Chances of any of this occuring are extremely low.  By statistics, you have more of a chance of getting killed in a motor vehicle accident: while driving to the hospital than any of the above occurring .  Nevertheless, you should be aware that they are possibilities.  In general, it is similar to taking a shower.  Everybody knows that you can slip, hit your head and get killed.  Does that mean that you should not shower again?  Nevertheless always keep in mind that statistics do not mean anything if you happen to be on the wrong side of them.  Even if a procedure has a 1 (one) in a 1,000,000 (million) chance of going wrong, it you happen to be that one..Also, keep in mind that by statistics, you have more of a chance of having something go wrong when taking medications.  Who should not have this procedure? If you are on a blood thinning medication (e.g. Coumadin, Plavix, see list of "Blood Thinners"), or if you have an active infection  going on, you should not have the procedure.  If you are taking any blood thinners, please inform your physician.  How should I prepare for this procedure?  Do not eat or drink anything at least six hours prior to the procedure.  Bring a driver with you .  It cannot be a taxi.  Come accompanied by an adult that can drive you back, and that is strong enough to help you if your legs get weak or numb from the local anesthetic.  Take all of your medicines the morning of the procedure with just enough water to swallow them.  If you have diabetes, make sure that you are scheduled to have your procedure done first thing in the morning, whenever possible.  If you have diabetes, take only half of your insulin dose and notify our nurse that you have done so as soon as you arrive at the clinic.  If you are diabetic, but only take blood sugar pills (oral hypoglycemic), then do not take them on the morning of your procedure.  You may take them after you have had the procedure.  Do not take aspirin or any aspirin-containing medications, at least eleven (11) days prior to the procedure.  They may prolong bleeding.  Wear loose fitting clothing that may be easy to take off and that you would not mind if it got stained with Betadine or blood.  Do not wear any jewelry or perfume  Remove any nail coloring.  It will interfere with some of our monitoring equipment.  NOTE: Remember that this is not meant to be interpreted as a complete list of all possible complications.  Unforeseen problems may occur.  BLOOD THINNERS The following drugs contain aspirin or other products, which can cause increased bleeding during surgery and should not be taken for 2 weeks prior to and 1 week after surgery.  If you should need take something for relief of minor pain, you may take acetaminophen which is found in Tylenol,m Datril, Anacin-3 and Panadol. It is not blood thinner. The products listed below are.  Do not take any of  the products listed below in addition to any listed on your instruction sheet.  A.P.C or A.P.C with Codeine Codeine Phosphate Capsules #3 Ibuprofen Ridaura  ABC compound Congesprin Imuran rimadil  Advil Cope Indocin Robaxisal  Alka-Seltzer Effervescent Pain Reliever and Antacid Coricidin or Coricidin-D  Indomethacin Rufen  Alka-Seltzer plus Cold Medicine Cosprin Ketoprofen S-A-C Tablets  Anacin Analgesic Tablets or Capsules Coumadin Korlgesic Salflex  Anacin Extra Strength Analgesic tablets or capsules CP-2 Tablets Lanoril Salicylate  Anaprox Cuprimine Capsules Levenox Salocol  Anexsia-D Dalteparin Magan Salsalate  Anodynos Darvon compound Magnesium Salicylate Sine-off  Ansaid Dasin Capsules Magsal Sodium Salicylate  Anturane Depen Capsules Marnal Soma  APF Arthritis pain formula Dewitt's Pills Measurin Stanback  Argesic Dia-Gesic Meclofenamic Sulfinpyrazone  Arthritis Bayer Timed Release Aspirin Diclofenac Meclomen Sulindac  Arthritis pain formula Anacin Dicumarol Medipren Supac  Analgesic (Safety coated) Arthralgen Diffunasal Mefanamic Suprofen  Arthritis Strength Bufferin Dihydrocodeine Mepro Compound Suprol  Arthropan liquid Dopirydamole Methcarbomol with Aspirin Synalgos  ASA tablets/Enseals Disalcid Micrainin Tagament  Ascriptin Doan's Midol Talwin  Ascriptin A/D Dolene Mobidin Tanderil  Ascriptin Extra Strength Dolobid Moblgesic Ticlid  Ascriptin with Codeine Doloprin or Doloprin with Codeine Momentum Tolectin  Asperbuf Duoprin Mono-gesic Trendar  Aspergum Duradyne Motrin or Motrin IB Triminicin  Aspirin plain, buffered or enteric coated Durasal Myochrisine Trigesic  Aspirin Suppositories Easprin Nalfon Trillsate  Aspirin with Codeine Ecotrin Regular or Extra Strength Naprosyn Uracel  Atromid-S Efficin Naproxen Ursinus  Auranofin Capsules Elmiron Neocylate Vanquish  Axotal Emagrin Norgesic Verin  Azathioprine Empirin or Empirin with Codeine Normiflo Vitamin E  Azolid  Emprazil Nuprin Voltaren  Bayer Aspirin plain, buffered or children's or timed BC Tablets or powders Encaprin Orgaran Warfarin Sodium  Buff-a-Comp Enoxaparin Orudis Zorpin  Buff-a-Comp with Codeine Equegesic Os-Cal-Gesic   Buffaprin Excedrin plain, buffered or Extra Strength Oxalid   Bufferin Arthritis Strength Feldene Oxphenbutazone   Bufferin plain or Extra Strength Feldene Capsules Oxycodone with Aspirin   Bufferin with Codeine Fenoprofen Fenoprofen Pabalate or Pabalate-SF   Buffets II Flogesic Panagesic   Buffinol plain or Extra Strength Florinal or Florinal with Codeine Panwarfarin   Buf-Tabs Flurbiprofen Penicillamine   Butalbital   Compound Four-way cold tablets Penicillin   Butazolidin Fragmin Pepto-Bismol   Carbenicillin Geminisyn Percodan   Carna Arthritis Reliever Geopen Persantine   Carprofen Gold's salt Persistin   Chloramphenicol Goody's Phenylbutazone   Chloromycetin Haltrain Piroxlcam   Clmetidine heparin Plaquenil   Cllnoril Hyco-pap Ponstel   Clofibrate Hydroxy chloroquine Propoxyphen         Before stopping any of these medications, be sure to consult the physician who ordered them.  Some, such as Coumadin (Warfarin) are ordered to prevent or treat serious conditions such as "deep thrombosis", "pumonary embolisms", and other heart problems.  The amount of time that you may need off of the medication may also vary with the medication and the reason for which you were taking it.  If you are taking any of these medications, please make sure you notify your pain physician before you undergo any procedures.

## 2017-09-10 NOTE — Progress Notes (Signed)
Safety precautions to be maintained throughout the outpatient stay will include: orient to surroundings, keep bed in low position, maintain call bell within reach at all times, provide assistance with transfer out of bed and ambulation.  

## 2017-09-20 ENCOUNTER — Other Ambulatory Visit (INDEPENDENT_AMBULATORY_CARE_PROVIDER_SITE_OTHER): Payer: Medicare Other

## 2017-09-20 DIAGNOSIS — I10 Essential (primary) hypertension: Secondary | ICD-10-CM

## 2017-09-20 DIAGNOSIS — E7800 Pure hypercholesterolemia, unspecified: Secondary | ICD-10-CM

## 2017-09-20 LAB — CBC WITH DIFFERENTIAL/PLATELET
BASOS ABS: 0.1 10*3/uL (ref 0.0–0.1)
BASOS PCT: 1.1 % (ref 0.0–3.0)
EOS ABS: 0.3 10*3/uL (ref 0.0–0.7)
Eosinophils Relative: 4.9 % (ref 0.0–5.0)
HEMATOCRIT: 35.8 % — AB (ref 36.0–46.0)
Hemoglobin: 12 g/dL (ref 12.0–15.0)
LYMPHS ABS: 1.3 10*3/uL (ref 0.7–4.0)
LYMPHS PCT: 23.2 % (ref 12.0–46.0)
MCHC: 33.5 g/dL (ref 30.0–36.0)
MCV: 89.4 fl (ref 78.0–100.0)
Monocytes Absolute: 0.5 10*3/uL (ref 0.1–1.0)
Monocytes Relative: 9.9 % (ref 3.0–12.0)
NEUTROS ABS: 3.3 10*3/uL (ref 1.4–7.7)
NEUTROS PCT: 60.9 % (ref 43.0–77.0)
PLATELETS: 293 10*3/uL (ref 150.0–400.0)
RBC: 4 Mil/uL (ref 3.87–5.11)
RDW: 14.8 % (ref 11.5–15.5)
WBC: 5.5 10*3/uL (ref 4.0–10.5)

## 2017-09-20 LAB — HEPATIC FUNCTION PANEL
ALK PHOS: 51 U/L (ref 39–117)
ALT: 18 U/L (ref 0–35)
AST: 18 U/L (ref 0–37)
Albumin: 4 g/dL (ref 3.5–5.2)
BILIRUBIN DIRECT: 0.2 mg/dL (ref 0.0–0.3)
BILIRUBIN TOTAL: 0.4 mg/dL (ref 0.2–1.2)
TOTAL PROTEIN: 7.3 g/dL (ref 6.0–8.3)

## 2017-09-20 LAB — LIPID PANEL
Cholesterol: 140 mg/dL (ref 0–200)
HDL: 41.3 mg/dL
LDL Cholesterol: 72 mg/dL (ref 0–99)
NonHDL: 99
Total CHOL/HDL Ratio: 3
Triglycerides: 135 mg/dL (ref 0.0–149.0)
VLDL: 27 mg/dL (ref 0.0–40.0)

## 2017-09-20 LAB — BASIC METABOLIC PANEL
BUN: 15 mg/dL (ref 6–23)
CHLORIDE: 98 meq/L (ref 96–112)
CO2: 30 mEq/L (ref 19–32)
CREATININE: 0.92 mg/dL (ref 0.40–1.20)
Calcium: 9.8 mg/dL (ref 8.4–10.5)
GFR: 62.44 mL/min (ref >60.00)
Glucose, Bld: 81 mg/dL (ref 70–99)
POTASSIUM: 4.3 meq/L (ref 3.5–5.1)
Sodium: 136 mEq/L (ref 135–145)

## 2017-09-25 ENCOUNTER — Other Ambulatory Visit: Payer: Self-pay | Admitting: Internal Medicine

## 2017-09-25 ENCOUNTER — Other Ambulatory Visit: Payer: Self-pay | Admitting: Nurse Practitioner

## 2017-09-25 DIAGNOSIS — M150 Primary generalized (osteo)arthritis: Principal | ICD-10-CM

## 2017-09-25 DIAGNOSIS — M159 Polyosteoarthritis, unspecified: Secondary | ICD-10-CM

## 2017-10-02 NOTE — Progress Notes (Signed)
Patient's Name: Wanda Martinez  MRN: [id number]  Referring Provider: Einar Pheasant, MD  DOB: 06-23-1935  PCP: Einar Pheasant, MD  DOS: 10/03/2017  Note by: Gaspar Cola, MD  Service setting: Ambulatory outpatient  Specialty: Interventional Pain Management  Location: [facility] (AMB) Pain Management Facility    Patient type: Established   Primary Reason(s) for Visit: Encounter for prescription drug management. (Level of risk: moderate)  CC: Back Pain (right, lower)  HPI  Wanda Martinez is a 82 y.o. year old, female patient, who comes today for a medication management evaluation. She has Hypercholesterolemia; Essential hypertension; Frequent UTI; Primary cancer of upper inner quadrant of right female breast ([place]); Tremor; Toenail fungus; Headache; Obesity (BMI 30-39.9); Dysuria; Parkinsons ([place]); Fatigue; Chronic kidney disease (CKD), stage II (mild); Clinical depression; Incomplete bladder emptying; Mixed incontinence; HLD (hyperlipidemia); Bladder infection, chronic; Parkinson's disease ([place]); Pure hypercholesterolemia; Hernia, rectovaginal; Long term current use of opiate analgesic; Long term prescription opiate use; Opiate use; Encounter for therapeutic drug level monitoring; Encounter for chronic pain management; Chronic low back pain (Primary Source of Pain) (Bilateral) (R>L); Lumbar spondylosis; Chronic hip pain; Chronic neck pain; Cervical spondylosis; Chronic cervical radicular pain (Right); Chronic upper extremity pain (Right); Osteoarthritis; Diffuse myofascial pain syndrome; Neurogenic pain; Chronic upper back pain (Right); Myofascial pain syndrome (Right) (cervicothoracic); Vaginitis; Lumbar facet syndrome (Bilateral) (R>L); Malignant neoplasm of right female breast ([place]); Chronic constipation; Cervical facet hypertrophy; Cervical facet syndrome ([place]); Chronic shoulder pain (Right); Chronic pain syndrome; Chronic sacroiliac joint pain (Left); Chronic right sacroiliac joint pain; Lumbosacral  foraminal stenosis (L5-S1) (Right); Lumbar spinal stenosis (with neurogenic claudication) (L3-4); Chronic lower extremity pain (Secondary Area of Pain) (Right); and Chronic lumbar radicular pain (S1) (Right) on her problem list. Her primarily concern today is the Back Pain (right, lower)  Pain Assessment: Location: Right, Lower Back Radiating: right calf Onset: More than a month ago Duration: Chronic pain Quality: Sore Severity: 6 /10 (self-reported pain score)  Note: Reported level is inconsistent with clinical observations. Clinically the patient looks like a 4/10       Pain level appears to be "Moderately Severe", defined here as impossible to ignore for more than a few minutes. With effort, patients may still be able to manage work or participate in some social activities. Very difficult to concentrate. Signs of autonomic nervous system discharge are evident: dilated pupils (mydriasis); mild sweating (diaphoresis); sleep interference. Heart rate becomes elevated (>115 bpm). Diastolic blood pressure (lower number) rises above 100 mmHg. Patients find relief in laying down and not moving. Timing: Constant Modifying factors: lying down on left hip, heating pad, medications  Wanda Martinez was last scheduled for an appointment on 09/10/2017 for medication management. During today's appointment we reviewed Wanda Martinez's chronic pain status, as well as her outpatient medication regimen.  The patient  reports that she does not use drugs. Her body mass index is 29.7 kg/m.  Further details on both, my assessment(s), as well as the proposed treatment plan, please see below.  Controlled Substance Pharmacotherapy Assessment REMS (Risk Evaluation and Mitigation Strategy)  Analgesic: Hydrocodone/APAP 5/325 one tablet every 8 hours (15 mg/day) MME/day:15 mg/day. Wanda Martins, RN  10/03/2017  2:31 PM  Sign at close encounter Nursing Pain Medication Assessment:  Safety precautions to be maintained  throughout the outpatient stay will include: orient to surroundings, keep bed in low position, maintain call bell within reach at all times, provide assistance with transfer out of bed and ambulation.  Medication Inspection Compliance: Pill count conducted under  aseptic conditions, in front of the patient. Neither the pills nor the bottle was removed from the patient's sight at any time. Once count was completed pills were immediately returned to the patient in their original bottle.  Medication: Hydrocodone/APAP Pill/Patch Count: 71 of 90 pills remain Pill/Patch Appearance: Markings consistent with prescribed medication Bottle Appearance: Standard pharmacy container. Clearly labeled. Filled Date: 10/11 / 2018 Last Medication intake:  Today   Pharmacokinetics: Liberation and absorption (onset of action): WNL Distribution (time to peak effect): WNL Metabolism and excretion (duration of action): WNL         Pharmacodynamics: Desired effects: Analgesia: Wanda Martinez reports >50% benefit. Functional ability: Patient reports that medication allows her to accomplish basic ADLs Clinically meaningful improvement in function (CMIF): Sustained CMIF goals met Perceived effectiveness: Described as relatively effective, allowing for increase in activities of daily living (ADL) Undesirable effects: Side-effects or Adverse reactions: None reported Monitoring: [place] PMP: Online review of the past 31-monthperiod conducted. Compliant with practice rules and regulations Last UDS on record: Summary  Date Value Ref Range Status  07/02/2017 FINAL  Final    Comment:    ==================================================================== TOXASSURE SELECT 13 (MW) ==================================================================== Test                             Result       Flag       Units Drug Present and Declared for Prescription Verification   Hydrocodone                    597          EXPECTED   ng/mg  creat   Dihydrocodeine                 245          EXPECTED   ng/mg creat   Norhydrocodone                 1486         EXPECTED   ng/mg creat    Sources of hydrocodone include scheduled prescription    medications. Dihydrocodeine and norhydrocodone are expected    metabolites of hydrocodone. Dihydrocodeine is also available as a    scheduled prescription medication. ==================================================================== Test                      Result    Flag   Units      Ref Range   Creatinine              119              mg/dL      >=20 ==================================================================== Declared Medications:  The flagging and interpretation on this report are based on the  following declared medications.  Unexpected results may arise from  inaccuracies in the declared medications.  **Note: The testing scope of this panel includes these medications:  Hydrocodone (Hydrocodone-Acetaminophen)  **Note: The testing scope of this panel does not include following  reported medications:  Acetaminophen (Hydrocodone-Acetaminophen)  Amlodipine Besylate  Aspirin  Calcium Carbonate  Carbidopa (Carbidopa/Levodopa)  Citalopram  Cyclobenzaprine  Gabapentin  Glucosamine  Hydrochlorothiazide  Letrozole  Levodopa (Carbidopa/Levodopa)  Losartan (Losartan Potassium)  Lubiprostone  Magnesium Oxide  Meloxicam  Metoprolol  Multivitamin  Omega-3 Fatty Acids  Omeprazole  Pravastatin ==================================================================== For clinical consultation, please call ([phone]====    UDS interpretation: Compliant  Medication Assessment Form: Reviewed. Patient indicates being compliant with therapy Treatment compliance: Compliant Risk Assessment Profile: Aberrant behavior: See prior evaluations. None observed or detected today Comorbid factors increasing  risk of overdose: See prior notes. No additional risks detected today Risk of substance use disorder (SUD): Low     Opioid Risk Tool - 10/04/17 1230      Family History of Substance Abuse   Alcohol Negative   Illegal Drugs Negative   Rx Drugs Negative     Personal History of Substance Abuse   Alcohol Negative   Illegal Drugs Negative     Age   Age between 13-45 years  No     Psychological Disease   Psychological Disease Positive   ADD Negative   OCD Negative   Bipolar Negative   Schizophrenia Negative   Depression Positive     Total Score   Opioid Risk Tool Scoring 3   Opioid Risk Interpretation Low Risk     ORT Scoring interpretation table:  Score <3 = Low Risk for SUD  Score between 4-7 = Moderate Risk for SUD  Score >8 = High Risk for Opioid Abuse   Risk Mitigation Strategies:  Patient Counseling: Covered Patient-Prescriber Agreement (PPA): Present and active  Notification to other healthcare providers: Done  Pharmacologic Plan: No change in therapy, at this time  Laboratory Chemistry  Inflammation Markers (CRP: Acute Phase) (ESR: Chronic Phase) No results found for: CRP, ESRSEDRATE               Renal Function Markers Lab Results  Component Value Date   BUN 15 09/20/2017   CREATININE 0.92 09/20/2017   GFRAA >60 10/08/2014   GFRNONAA >60 10/08/2014                 Hepatic Function Markers Lab Results  Component Value Date   AST 18 09/20/2017   ALT 18 09/20/2017   ALBUMIN 4.0 09/20/2017   ALKPHOS 51 09/20/2017                 Electrolytes Lab Results  Component Value Date   NA 136 09/20/2017   K 4.3 09/20/2017   CL 98 09/20/2017   CALCIUM 9.8 09/20/2017   MG 1.8 04/21/2013                 Neuropathy Markers Lab Results  Component Value Date   VITAMINB12 881 02/01/2016                 Bone Pathology Markers Lab Results  Component Value Date   ALKPHOS 51 09/20/2017   CALCIUM 9.8 09/20/2017                 Coagulation  Parameters Lab Results  Component Value Date   PLT 293.0 09/20/2017                 Cardiovascular Markers Lab Results  Component Value Date   HGB 12.0 09/20/2017   HCT 35.8 (L) 09/20/2017                 Note: Lab results reviewed.  Recent Diagnostic Imaging Results  DG C-Arm 1-60 Min-No Report Fluoroscopy was utilized by the requesting physician.  No radiographic  interpretation.   Complexity Note: Imaging results reviewed. Results shared with Wanda Martinez, using Layman's terms.                         Meds   Current Outpatient  Prescriptions:  .  amLODipine (NORVASC) 2.5 MG tablet, TAKE 1 TABLET BY MOUTH ONCE DAILY., Disp: 30 tablet, Rfl: 5 .  aspirin 81 MG EC tablet, Take 81 mg by mouth daily as needed.  , Disp: , Rfl:  .  Calcium Carbonate (CALCIUM 600) 1500 MG TABS, Take 1 tablet by mouth daily.  , Disp: , Rfl:  .  carbidopa-levodopa (SINEMET IR) 25-100 MG tablet, Take by mouth., Disp: , Rfl:  .  citalopram (CELEXA) 20 MG tablet, TAKE 1 & 1/2 TABLETS BY MOUTH ONCE DAILY, Disp: , Rfl:  .  [START ON 10/26/2017] cyclobenzaprine (FLEXERIL) 10 MG tablet, Take 1 tablet (10 mg total) by mouth at bedtime., Disp: 90 tablet, Rfl: 0 .  [START ON 10/26/2017] gabapentin (NEURONTIN) 300 MG capsule, Take 1 capsule (300 mg total) by mouth 2 (two) times daily., Disp: 180 capsule, Rfl: 0 .  GLUCOSAMINE SULFATE PO, Take by mouth daily. , Disp: , Rfl:  .  hydrochlorothiazide (HYDRODIURIL) 25 MG tablet, TAKE 1 TABLET BY MOUTH ONCE DAILY. (PATIENT TAKE 1/2 ONCE DAILY AND EXTRA 1/2 IF NEEDED), Disp: 15 tablet, Rfl: 4 .  [START ON 11/25/2017] HYDROcodone-acetaminophen (NORCO/VICODIN) 5-325 MG tablet, Take 1 tablet by mouth every 8 (eight) hours as needed for severe pain., Disp: 90 tablet, Rfl: 0 .  [START ON 10/26/2017] HYDROcodone-acetaminophen (NORCO/VICODIN) 5-325 MG tablet, Take 1 tablet by mouth every 8 (eight) hours as needed for severe pain., Disp: 90 tablet, Rfl: 0 .  [START ON 12/25/2017]  HYDROcodone-acetaminophen (NORCO/VICODIN) 5-325 MG tablet, Take 1 tablet by mouth every 8 (eight) hours as needed for severe pain., Disp: 90 tablet, Rfl: 0 .  letrozole (FEMARA) 2.5 MG tablet, TAKE 1 TABLET BY MOUTH ONCE DAILY., Disp: 90 tablet, Rfl: 3 .  losartan (COZAAR) 100 MG tablet, Take 1 tablet (100 mg total) by mouth daily., Disp: 30 tablet, Rfl: 2 .  lubiprostone (AMITIZA) 24 MCG capsule, TAKE 1 CAPSULE BY MOUTH ONCE DAILY WITH BREAKFAST., Disp: , Rfl:  .  [START ON 10/26/2017] magnesium oxide (MAGNESIUM-OXIDE) 400 (241.3 Mg) MG tablet, Take 1 tablet (400 mg total) by mouth daily., Disp: 90 tablet, Rfl: 0 .  [START ON 10/26/2017] meloxicam (MOBIC) 7.5 MG tablet, Take 1 tablet (7.5 mg total) by mouth daily., Disp: 90 tablet, Rfl: 0 .  metoprolol succinate (TOPROL-XL) 25 MG 24 hr tablet, Take 1 tablet (25 mg total) by mouth 2 (two) times daily., Disp: 60 tablet, Rfl: 2 .  Multiple Vitamin (MULTIVITAMIN) tablet, Take 1 tablet by mouth daily., Disp: , Rfl:  .  Omega-3 1000 MG CAPS, Take by mouth daily. , Disp: , Rfl:  .  pantoprazole (PROTONIX) 40 MG tablet, Take 1 tablet (40 mg total) by mouth daily., Disp: 30 tablet, Rfl: 2 .  pravastatin (PRAVACHOL) 20 MG tablet, Take 1 tablet (20 mg total) by mouth daily., Disp: 30 tablet, Rfl: 2 No current facility-administered medications for this visit.   Facility-Administered Medications Ordered in Other Visits:  .  fentaNYL (SUBLIMAZE) injection 25-50 mcg, 25-50 mcg, Intravenous, Q5 min PRN, Milinda Pointer, MD, 100 mcg at 10/04/17 1300 .  midazolam (VERSED) 5 MG/5ML injection 1-2 mg, 1-2 mg, Intravenous, Q5 min PRN, Milinda Pointer, MD, 3 mg at 10/04/17 1300  ROS  Constitutional: Denies any fever or chills Gastrointestinal: No reported hemesis, hematochezia, vomiting, or acute GI distress Musculoskeletal: Denies any acute onset joint swelling, redness, loss of ROM, or weakness Neurological: No reported episodes of acute onset apraxia, aphasia,  dysarthria, agnosia, amnesia, paralysis, loss of  coordination, or loss of consciousness  Allergies  Wanda Martinez is allergic to sulfa antibiotics and vesicare [solifenacin].  [place]  Drug: Wanda Martinez  reports that she does not use drugs. Alcohol:  reports that she does not drink alcohol. Tobacco:  reports that she has never smoked. She has never used smokeless tobacco. Medical:  has a past medical history of Anemia; Arm pain (07/26/2015); Arthritis; Arthritis, degenerative (03/26/2014); Back pain (11/01/2013); Breast cancer ([place]); Breast cancer ([place]); CHEST PAIN (04/29/2010); Chronic cystitis; Cystocele (02/22/2013); Cystocele, midline (08/19/2013); Degeneration of intervertebral disc of lumbosacral region (03/26/2014); DYSPNEA (04/29/2010); Enthesopathy of hip (03/26/2014); GERD (gastroesophageal reflux disease); Hiatal hernia; HTN (hypertension); LBP (low back pain) (03/26/2014); Neck pain (11/01/2013); Parkinson disease ([place]); Sinusitis (02/07/2015); Skin lesions (07/12/2014); and Urinary incontinence. Surgical: Wanda Martinez  has a past surgical history that includes Breast biopsy (2013); Tonsillectomy and adenoidectomy (81); Vesicovaginal fistula closure w/ TAH (1983); Breast surgery (1986); Breast enhancement surgery (1987); Breast implant removal; Breast implant removal (Right, 08/29/2012); Mastectomy (08/2012); Abdominal hysterectomy; and Colonoscopy with propofol (N/A, 09/13/2016). Family: family history includes Colon polyps in her father; Diabetes in her father; Parkinson's disease in her mother; Stroke in her father and mother.  Constitutional Exam  General appearance: Well nourished, well developed, and well hydrated. In no apparent acute distress Vitals:   10/03/17 1426  BP: (!) 157/70  Pulse: (!) 58  Resp: 16  Temp: 98.6 F (37 C)  TempSrc: Oral  SpO2: 100%  Weight: 173 lb (78.5 kg)  Height: '5\' 4"'$  (1.626 m)   BMI Assessment: Estimated body mass index is 29.7 kg/m as calculated from the following:    Height as of this encounter: '5\' 4"'$  (1.626 m).   Weight as of this encounter: 173 lb (78.5 kg).  BMI interpretation table: BMI level Category Range association with higher incidence of chronic pain  <18 kg/m2 Underweight   18.5-24.9 kg/m2 Ideal body weight   25-29.9 kg/m2 Overweight Increased incidence by 20%  30-34.9 kg/m2 Obese (Class I) Increased incidence by 68%  35-39.9 kg/m2 Severe obesity (Class II) Increased incidence by 136%  >40 kg/m2 Extreme obesity (Class III) Increased incidence by 254%   BMI Readings from Last 4 Encounters:  10/04/17 29.70 kg/m  10/03/17 29.70 kg/m  09/10/17 30.90 kg/m  08/22/17 30.31 kg/m   Wt Readings from Last 4 Encounters:  10/04/17 173 lb (78.5 kg)  10/03/17 173 lb (78.5 kg)  09/10/17 180 lb (81.6 kg)  08/22/17 176 lb 9.6 oz (80.1 kg)  Psych/Mental status: Alert, oriented x 3 (person, place, & time)       Eyes: PERLA Respiratory: No evidence of acute respiratory distress  Cervical Spine Area Exam  Skin & Axial Inspection: No masses, redness, edema, swelling, or associated skin lesions Alignment: Symmetrical Functional ROM: Unrestricted ROM      Stability: No instability detected Muscle Tone/Strength: Functionally intact. No obvious neuro-muscular anomalies detected. Sensory (Neurological): Unimpaired Palpation: No palpable anomalies              Upper Extremity (UE) Exam    Side: Right upper extremity  Side: Left upper extremity  Skin & Extremity Inspection: Skin color, temperature, and hair growth are WNL. No peripheral edema or cyanosis. No masses, redness, swelling, asymmetry, or associated skin lesions. No contractures.  Skin & Extremity Inspection: Skin color, temperature, and hair growth are WNL. No peripheral edema or cyanosis. No masses, redness, swelling, asymmetry, or associated skin lesions. No contractures.  Functional ROM: Unrestricted ROM  Functional ROM: Unrestricted ROM          Muscle Tone/Strength: Functionally  intact. No obvious neuro-muscular anomalies detected.  Muscle Tone/Strength: Functionally intact. No obvious neuro-muscular anomalies detected.  Sensory (Neurological): Unimpaired          Sensory (Neurological): Unimpaired          Palpation: No palpable anomalies              Palpation: No palpable anomalies              Specialized Test(s): Deferred         Specialized Test(s): Deferred          Thoracic Spine Area Exam  Skin & Axial Inspection: No masses, redness, or swelling Alignment: Symmetrical Functional ROM: Unrestricted ROM Stability: No instability detected Muscle Tone/Strength: Functionally intact. No obvious neuro-muscular anomalies detected. Sensory (Neurological): Unimpaired Muscle strength & Tone: No palpable anomalies  Lumbar Spine Area Exam  Skin & Axial Inspection: No masses, redness, or swelling Alignment: Symmetrical Functional ROM: Unrestricted ROM      Stability: No instability detected Muscle Tone/Strength: Functionally intact. No obvious neuro-muscular anomalies detected. Sensory (Neurological): Unimpaired Palpation: No palpable anomalies       Provocative Tests: Lumbar Hyperextension and rotation test: evaluation deferred today       Lumbar Lateral bending test: evaluation deferred today       Patrick's Maneuver: evaluation deferred today                    Gait & Posture Assessment  Ambulation: Unassisted Gait: Relatively normal for age and body habitus Posture: WNL   Lower Extremity Exam    Side: Right lower extremity  Side: Left lower extremity  Skin & Extremity Inspection: Skin color, temperature, and hair growth are WNL. No peripheral edema or cyanosis. No masses, redness, swelling, asymmetry, or associated skin lesions. No contractures.  Skin & Extremity Inspection: Skin color, temperature, and hair growth are WNL. No peripheral edema or cyanosis. No masses, redness, swelling, asymmetry, or associated skin lesions. No contractures.  Functional ROM:  Decreased ROM for hip and knee joints  Functional ROM: Unrestricted ROM          Muscle Tone/Strength: L5 weakness  Muscle Tone/Strength: Functionally intact. No obvious neuro-muscular anomalies detected.  Sensory (Neurological): Dermatomal pain pattern (L5/S1)   Sensory (Neurological): Unimpaired  Palpation: No palpable anomalies  Palpation: No palpable anomalies   Assessment  Primary Diagnosis & Pertinent Problem List: The primary encounter diagnosis was Chronic low back pain (Primary Source of Pain) (Bilateral) (R>L). Diagnoses of Lumbar spinal stenosis (with neurogenic claudication) (L3-4), Chronic lower extremity pain (Secondary Area of Pain) (Right), Lumbosacral foraminal stenosis (L5-S1) (Right), Chronic lumbar radicular pain (S1) (Right), Lumbar facet syndrome (Bilateral) (R>L), Chronic sacroiliac joint pain (Left), Chronic right sacroiliac joint pain, Chronic pain syndrome, Diffuse myofascial pain syndrome, Primary osteoarthritis involving multiple joints, Parkinson's disease ([place]), Neurogenic pain, Long term prescription opiate use, and Opiate use were also pertinent to this visit.  Status Diagnosis  Worsening Controlled Worsening 1. Chronic low back pain (Primary Source of Pain) (Bilateral) (R>L)   2. Lumbar spinal stenosis (with neurogenic claudication) (L3-4)   3. Chronic lower extremity pain (Secondary Area of Pain) (Right)   4. Lumbosacral foraminal stenosis (L5-S1) (Right)   5. Chronic lumbar radicular pain (S1) (Right)   6. Lumbar facet syndrome (Bilateral) (R>L)   7. Chronic sacroiliac joint pain (Left)   8. Chronic right sacroiliac joint pain   9.  Chronic pain syndrome   10. Diffuse myofascial pain syndrome   11. Primary osteoarthritis involving multiple joints   12. Parkinson's disease ([place])   13. Neurogenic pain   14. Long term prescription opiate use   15. Opiate use     Problems updated and reviewed during this visit: No problems updated. Plan of Care   Pharmacotherapy (Medications Ordered): Meds ordered this encounter  Medications  . orphenadrine (NORFLEX) injection 60 mg  . ketorolac (TORADOL) injection 60 mg  . magnesium oxide (MAGNESIUM-OXIDE) 400 (241.3 Mg) MG tablet    Sig: Take 1 tablet (400 mg total) by mouth daily.    Dispense:  90 tablet    Refill:  0    Do not place this medication, or any other prescription from our practice, on "Automatic Refill". Patient may have prescription filled one day early if pharmacy is closed on scheduled refill date.  . cyclobenzaprine (FLEXERIL) 10 MG tablet    Sig: Take 1 tablet (10 mg total) by mouth at bedtime.    Dispense:  90 tablet    Refill:  0    Do not place this medication, or any other prescription from our practice, on "Automatic Refill". Patient may have prescription filled one day early if pharmacy is closed on scheduled refill date.  . gabapentin (NEURONTIN) 300 MG capsule    Sig: Take 1 capsule (300 mg total) by mouth 2 (two) times daily.    Dispense:  180 capsule    Refill:  0    Do not place this medication, or any other prescription from our practice, on "Automatic Refill". Patient may have prescription filled one day early if pharmacy is closed on scheduled refill date.  . meloxicam (MOBIC) 7.5 MG tablet    Sig: Take 1 tablet (7.5 mg total) by mouth daily.    Dispense:  90 tablet    Refill:  0    Do not place this medication, or any other prescription from our practice, on "Automatic Refill". Patient may have prescription filled one day early if pharmacy is closed on scheduled refill date.  Marland Kitchen HYDROcodone-acetaminophen (NORCO/VICODIN) 5-325 MG tablet    Sig: Take 1 tablet by mouth every 8 (eight) hours as needed for severe pain.    Dispense:  90 tablet    Refill:  0    Do not place this medication, or any other prescription from our practice, on "Automatic Refill". Patient may have prescription filled one day early if pharmacy is closed on scheduled refill date. Do not fill  until: 11/25/2017 To last until: 12/25/17  . HYDROcodone-acetaminophen (NORCO/VICODIN) 5-325 MG tablet    Sig: Take 1 tablet by mouth every 8 (eight) hours as needed for severe pain.    Dispense:  90 tablet    Refill:  0    Do not place this medication, or any other prescription from our practice, on "Automatic Refill". Patient may have prescription filled one day early if pharmacy is closed on scheduled refill date. Do not fill until: 10/26/2017 To last until: 11/25/2017  . HYDROcodone-acetaminophen (NORCO/VICODIN) 5-325 MG tablet    Sig: Take 1 tablet by mouth every 8 (eight) hours as needed for severe pain.    Dispense:  90 tablet    Refill:  0    Do not place this medication, or any other prescription from our practice, on "Automatic Refill". Patient may have prescription filled one day early if pharmacy is closed on scheduled refill date. Do not fill until: 12/25/17 To  last until: 01/24/18   New Prescriptions   No medications on file   Medications administered today: We administered orphenadrine and ketorolac.   Procedure Orders     Lumbar Transforaminal Epidural     Lumbar Epidural Injection Lab Orders  No laboratory test(s) ordered today   Imaging Orders  No imaging studies ordered today   Referral Orders  No referral(s) requested today    Interventional management options: Planned, scheduled, and/or pending:   Scheduled the patient to return for a right sided L5-S1 transforaminal epidural steroid injection + (Midline) L3-4 interlaminar lumbar epidural steroid injection under fluoroscopic guidance and IV sedation. The patient is currently having a flareup of her pain and will try to do this before she comes in for her radiofrequency. Therapeutic bilateral lumbar facet+ bilateral sacroiliac joint RFA, Under fluoroscopic guidance and IV sedation. We will start with the right side.    Considering:   Diagnostic right-sided lumbar facet block Palliative right-sided  lumbar facet radiofrequency ablation Palliative Right suprascapular muscle trigger point injection Diagnostic bilateral cervical facet block Possible bilateral cervical facet radiofrequency ablation Diagnostic right-sided cervical epidural steroid injection Diagnostic intra-articular hip joint injection Possible hip joint radiofrequency ablation   Palliative PRN treatment(s):   Palliative bilateral lumbar facet block+ bilateral sacroiliac joint block    Provider-requested follow-up: Return for Procedure (w/ sedation): (R) L5 TFESI + (ML) L3-4 LESI. Medication Management with Dionisio David, NP before 01/24/18.  Future Appointments Date Time Provider [place]  10/09/2017 1:45 PM Lloyd Huger, MD [facility] None  10/25/2017 10:30 AM Milinda Pointer, MD [facility] None  10/26/2017 1:00 PM O'Brien-Blaney, Denisa L, LPN [facility] None  [id number] 11:30 AM Einar Pheasant, MD [facility] None   Primary Care Physician: Einar Pheasant, MD Location: [facility] Note by: Gaspar Cola, MD Date: 10/03/2017; Time: 4:13 PM

## 2017-10-03 ENCOUNTER — Ambulatory Visit: Payer: Medicare Other | Attending: Pain Medicine | Admitting: Pain Medicine

## 2017-10-03 ENCOUNTER — Encounter: Payer: Self-pay | Admitting: Pain Medicine

## 2017-10-03 VITALS — BP 157/70 | HR 58 | Temp 98.6°F | Resp 16 | Ht 64.0 in | Wt 173.0 lb

## 2017-10-03 DIAGNOSIS — Z888 Allergy status to other drugs, medicaments and biological substances status: Secondary | ICD-10-CM | POA: Insufficient documentation

## 2017-10-03 DIAGNOSIS — G8929 Other chronic pain: Secondary | ICD-10-CM | POA: Insufficient documentation

## 2017-10-03 DIAGNOSIS — M792 Neuralgia and neuritis, unspecified: Secondary | ICD-10-CM | POA: Diagnosis not present

## 2017-10-03 DIAGNOSIS — R32 Unspecified urinary incontinence: Secondary | ICD-10-CM | POA: Insufficient documentation

## 2017-10-03 DIAGNOSIS — M5410 Radiculopathy, site unspecified: Secondary | ICD-10-CM

## 2017-10-03 DIAGNOSIS — Z6829 Body mass index (BMI) 29.0-29.9, adult: Secondary | ICD-10-CM | POA: Diagnosis not present

## 2017-10-03 DIAGNOSIS — M4726 Other spondylosis with radiculopathy, lumbar region: Secondary | ICD-10-CM | POA: Insufficient documentation

## 2017-10-03 DIAGNOSIS — M150 Primary generalized (osteo)arthritis: Secondary | ICD-10-CM | POA: Diagnosis not present

## 2017-10-03 DIAGNOSIS — G894 Chronic pain syndrome: Secondary | ICD-10-CM | POA: Diagnosis not present

## 2017-10-03 DIAGNOSIS — Z9010 Acquired absence of unspecified breast and nipple: Secondary | ICD-10-CM | POA: Insufficient documentation

## 2017-10-03 DIAGNOSIS — G20 Parkinson's disease: Secondary | ICD-10-CM

## 2017-10-03 DIAGNOSIS — E7800 Pure hypercholesterolemia, unspecified: Secondary | ICD-10-CM | POA: Insufficient documentation

## 2017-10-03 DIAGNOSIS — Z5181 Encounter for therapeutic drug level monitoring: Secondary | ICD-10-CM | POA: Insufficient documentation

## 2017-10-03 DIAGNOSIS — M7918 Myalgia, other site: Secondary | ICD-10-CM | POA: Diagnosis not present

## 2017-10-03 DIAGNOSIS — E669 Obesity, unspecified: Secondary | ICD-10-CM | POA: Insufficient documentation

## 2017-10-03 DIAGNOSIS — Z823 Family history of stroke: Secondary | ICD-10-CM | POA: Insufficient documentation

## 2017-10-03 DIAGNOSIS — M48062 Spinal stenosis, lumbar region with neurogenic claudication: Secondary | ICD-10-CM | POA: Diagnosis not present

## 2017-10-03 DIAGNOSIS — M545 Low back pain: Secondary | ICD-10-CM

## 2017-10-03 DIAGNOSIS — M1990 Unspecified osteoarthritis, unspecified site: Secondary | ICD-10-CM | POA: Diagnosis not present

## 2017-10-03 DIAGNOSIS — M79604 Pain in right leg: Secondary | ICD-10-CM

## 2017-10-03 DIAGNOSIS — C50211 Malignant neoplasm of upper-inner quadrant of right female breast: Secondary | ICD-10-CM | POA: Insufficient documentation

## 2017-10-03 DIAGNOSIS — M25559 Pain in unspecified hip: Secondary | ICD-10-CM | POA: Diagnosis not present

## 2017-10-03 DIAGNOSIS — Z833 Family history of diabetes mellitus: Secondary | ICD-10-CM | POA: Insufficient documentation

## 2017-10-03 DIAGNOSIS — Z79811 Long term (current) use of aromatase inhibitors: Secondary | ICD-10-CM | POA: Insufficient documentation

## 2017-10-03 DIAGNOSIS — K449 Diaphragmatic hernia without obstruction or gangrene: Secondary | ICD-10-CM | POA: Insufficient documentation

## 2017-10-03 DIAGNOSIS — Z90710 Acquired absence of both cervix and uterus: Secondary | ICD-10-CM | POA: Insufficient documentation

## 2017-10-03 DIAGNOSIS — M9983 Other biomechanical lesions of lumbar region: Secondary | ICD-10-CM | POA: Diagnosis not present

## 2017-10-03 DIAGNOSIS — M533 Sacrococcygeal disorders, not elsewhere classified: Secondary | ICD-10-CM | POA: Diagnosis not present

## 2017-10-03 DIAGNOSIS — C50911 Malignant neoplasm of unspecified site of right female breast: Secondary | ICD-10-CM | POA: Insufficient documentation

## 2017-10-03 DIAGNOSIS — M4722 Other spondylosis with radiculopathy, cervical region: Secondary | ICD-10-CM | POA: Insufficient documentation

## 2017-10-03 DIAGNOSIS — Z881 Allergy status to other antibiotic agents status: Secondary | ICD-10-CM | POA: Insufficient documentation

## 2017-10-03 DIAGNOSIS — K219 Gastro-esophageal reflux disease without esophagitis: Secondary | ICD-10-CM | POA: Insufficient documentation

## 2017-10-03 DIAGNOSIS — Z7982 Long term (current) use of aspirin: Secondary | ICD-10-CM | POA: Diagnosis not present

## 2017-10-03 DIAGNOSIS — Z820 Family history of epilepsy and other diseases of the nervous system: Secondary | ICD-10-CM | POA: Insufficient documentation

## 2017-10-03 DIAGNOSIS — Z882 Allergy status to sulfonamides status: Secondary | ICD-10-CM | POA: Insufficient documentation

## 2017-10-03 DIAGNOSIS — F1190 Opioid use, unspecified, uncomplicated: Secondary | ICD-10-CM | POA: Diagnosis not present

## 2017-10-03 DIAGNOSIS — M25511 Pain in right shoulder: Secondary | ICD-10-CM | POA: Diagnosis not present

## 2017-10-03 DIAGNOSIS — M488X6 Other specified spondylopathies, lumbar region: Secondary | ICD-10-CM | POA: Diagnosis not present

## 2017-10-03 DIAGNOSIS — M4807 Spinal stenosis, lumbosacral region: Secondary | ICD-10-CM | POA: Diagnosis not present

## 2017-10-03 DIAGNOSIS — I129 Hypertensive chronic kidney disease with stage 1 through stage 4 chronic kidney disease, or unspecified chronic kidney disease: Secondary | ICD-10-CM | POA: Insufficient documentation

## 2017-10-03 DIAGNOSIS — N182 Chronic kidney disease, stage 2 (mild): Secondary | ICD-10-CM | POA: Insufficient documentation

## 2017-10-03 DIAGNOSIS — Z79899 Other long term (current) drug therapy: Secondary | ICD-10-CM | POA: Insufficient documentation

## 2017-10-03 DIAGNOSIS — Z87440 Personal history of urinary (tract) infections: Secondary | ICD-10-CM | POA: Diagnosis not present

## 2017-10-03 DIAGNOSIS — Z79891 Long term (current) use of opiate analgesic: Secondary | ICD-10-CM | POA: Diagnosis not present

## 2017-10-03 DIAGNOSIS — M47816 Spondylosis without myelopathy or radiculopathy, lumbar region: Secondary | ICD-10-CM | POA: Diagnosis not present

## 2017-10-03 DIAGNOSIS — K5909 Other constipation: Secondary | ICD-10-CM | POA: Insufficient documentation

## 2017-10-03 DIAGNOSIS — M159 Polyosteoarthritis, unspecified: Secondary | ICD-10-CM

## 2017-10-03 DIAGNOSIS — D649 Anemia, unspecified: Secondary | ICD-10-CM | POA: Insufficient documentation

## 2017-10-03 DIAGNOSIS — Z8371 Family history of colonic polyps: Secondary | ICD-10-CM | POA: Insufficient documentation

## 2017-10-03 MED ORDER — KETOROLAC TROMETHAMINE 60 MG/2ML IM SOLN
INTRAMUSCULAR | Status: AC
  Filled 2017-10-03: qty 2

## 2017-10-03 MED ORDER — ORPHENADRINE CITRATE 30 MG/ML IJ SOLN
60.0000 mg | Freq: Once | INTRAMUSCULAR | Status: AC
  Administered 2017-10-03: 60 mg via INTRAMUSCULAR

## 2017-10-03 MED ORDER — KETOROLAC TROMETHAMINE 60 MG/2ML IM SOLN
60.0000 mg | Freq: Once | INTRAMUSCULAR | Status: AC
  Administered 2017-10-03: 60 mg via INTRAMUSCULAR

## 2017-10-03 MED ORDER — HYDROCODONE-ACETAMINOPHEN 5-325 MG PO TABS: 1.0000 | ORAL_TABLET | Freq: Three times a day (TID) | ORAL | 0 refills | Status: DC | PRN

## 2017-10-03 MED ORDER — MAGNESIUM OXIDE 400 (241.3 MG) MG PO TABS: 1.0000 | ORAL_TABLET | Freq: Every day | ORAL | 0 refills | Status: DC

## 2017-10-03 MED ORDER — GABAPENTIN 300 MG PO CAPS: 300.0000 mg | ORAL_CAPSULE | Freq: Two times a day (BID) | ORAL | 0 refills | Status: DC

## 2017-10-03 MED ORDER — CYCLOBENZAPRINE HCL 10 MG PO TABS: 10.0000 mg | ORAL_TABLET | Freq: Every day | ORAL | 0 refills | Status: DC

## 2017-10-03 MED ORDER — ORPHENADRINE CITRATE 30 MG/ML IJ SOLN
INTRAMUSCULAR | Status: AC
  Filled 2017-10-03: qty 2

## 2017-10-03 MED ORDER — MELOXICAM 7.5 MG PO TABS: 7.5000 mg | ORAL_TABLET | Freq: Every day | ORAL | 0 refills | Status: DC

## 2017-10-03 NOTE — Patient Instructions (Addendum)
____________________________________________________________________________________________  Preparing for Procedure with Sedation Instructions: . Oral Intake: Do not eat or drink anything for at least 8 hours prior to your procedure. . Transportation: Public transportation is not allowed. Bring an adult driver. The driver must be physically present in our waiting room before any procedure can be started. [place] Kitchen Physical Assistance: Bring an adult physically capable of assisting you, in the event you need help. This adult should keep you company at home for at least 6 hours after the procedure. . Blood Pressure Medicine: Take your blood pressure medicine with a sip of water the morning of the procedure. . Blood thinners:  . Diabetics on insulin: Notify the staff so that you can be scheduled 1st case in the morning. If your diabetes requires high dose insulin, take only  of your normal insulin dose the morning of the procedure and notify the staff that you have done so. . Preventing infections: Shower with an antibacterial soap the morning of your procedure. . Build-up your immune system: Take 1000 mg of Vitamin C with every meal (3 times a day) the day prior to your procedure. Marland Kitchen Antibiotics: Inform the staff if you have a condition or reason that requires you to take antibiotics before dental procedures. . Pregnancy: If you are pregnant, call and cancel the procedure. . Sickness: If you have a cold, fever, or any active infections, call and cancel the procedure. . Arrival: You must be in the facility at least 30 minutes prior to your scheduled procedure. . Children: Do not bring children with you. . Dress appropriately: Bring dark clothing that you would not mind if they get stained. . Valuables: Do not bring any jewelry or valuables. Procedure appointments are reserved for interventional treatments only. Marland Kitchen No Prescription Refills. . No medication changes will be discussed during procedure  appointments. . No disability issues will be discussed. ____________________________________________________________________________________________  Radiofrequency Lesioning, Care After Refer to this sheet in the next few weeks. These instructions provide you with information about caring for yourself after your procedure. Your health care provider may also give you more specific instructions. Your treatment has been planned according to current medical practices, but problems sometimes occur. Call your health care provider if you have any problems or questions after your procedure. What can I expect after the procedure? After the procedure, it is common to have:  Pain from the burned nerve.  Temporary numbness.  Follow these instructions at home:  Take over-the-counter and prescription medicines only as told by your health care provider.  Return to your normal activities as told by your health care provider. Ask your health care provider what activities are safe for you.  Pay close attention to how you feel after the procedure. If you start to have pain, write down when it hurts and how it feels. This will help you and your health care provider to know if you need an additional treatment.  Check your needle insertion site every day for signs of infection. Watch for: ? Redness, swelling, or pain. ? Fluid, blood, or pus.  Keep all follow-up visits as told by your health care provider. This is important. Contact a health care provider if:  Your pain does not get better.  You have redness, swelling, or pain at the needle insertion site.  You have fluid, blood, or pus coming from the needle insertion site.  You have a fever. Get help right away if:  You develop sudden, severe pain.  You develop numbness or tingling near  the procedure site that does not go away. This information is not intended to replace advice given to you by your health care provider. Make sure you discuss any  questions you have with your health care provider. Document Released: 08/03/2011 Document Revised: 05/11/2016 Document Reviewed: 01/11/2015 Elsevier Interactive Patient Education  2018 [employer]. Radiofrequency Lesioning Radiofrequency lesioning is a procedure that is performed to relieve pain. The procedure is often used for back, neck, or arm pain. Radiofrequency lesioning involves the use of a machine that creates radio waves to make heat. During the procedure, the heat is applied to the nerve that carries the pain signal. The heat damages the nerve and interferes with the pain signal. Pain relief usually starts about 2 weeks after the procedure and lasts for 6 months to 1 year. Tell a health care provider about:  Any allergies you have.  All medicines you are taking, including vitamins, herbs, eye drops, creams, and over-the-counter medicines.  Any problems you or family members have had with anesthetic medicines.  Any blood disorders you have.  Any surgeries you have had.  Any medical conditions you have.  Whether you are pregnant or may be pregnant. What are the risks? Generally, this is a safe procedure. However, problems may occur, including:  Pain or soreness at the injection site.  Infection at the injection site.  Damage to nerves or blood vessels.  What happens before the procedure?  Ask your health care provider about: ? Changing or stopping your regular medicines. This is especially important if you are taking diabetes medicines or blood thinners. ? Taking medicines such as aspirin and ibuprofen. These medicines can thin your blood. Do not take these medicines before your procedure if your health care provider instructs you not to.  Follow instructions from your health care provider about eating or drinking restrictions.  Plan to have someone take you home after the procedure.  If you go home right after the procedure, plan to have someone with you for 24  hours. What happens during the procedure?  You will be given one or more of the following: ? A medicine to help you relax (sedative). ? A medicine to numb the area (local anesthetic).  You will be awake during the procedure. You will need to be able to talk with the health care provider during the procedure.  With the help of a type of X-ray (fluoroscopy), the health care provider will insert a radiofrequency needle into the area to be treated.  Next, a wire that carries the radio waves (electrode) will be put through the radiofrequency needle. An electrical pulse will be sent through the electrode to verify the correct nerve. You will feel a tingling sensation, and you may have muscle twitching.  Then, the tissue that is around the needle tip will be heated by an electric current that is passed using the radiofrequency machine. This will numb the nerves.  A bandage (dressing) will be put on the insertion area after the procedure is done. The procedure may vary among health care providers and hospitals. What happens after the procedure?  Your blood pressure, heart rate, breathing rate, and blood oxygen level will be monitored often until the medicines you were given have worn off.  Return to your normal activities as directed by your health care provider. This information is not intended to replace advice given to you by your health care provider. Make sure you discuss any questions you have with your health care provider. Document Released:  08/02/2011 Document Revised: 05/11/2016 Document Reviewed: 01/11/2015 Elsevier Interactive Patient Education  2018 [employer]. Pain Management Discharge Instructions  General Discharge Instructions :  If you need to reach your doctor call: Monday-Friday 8:00 am - 4:00 pm at [phone] or toll free [phone].  After clinic hours [phone] to have operator reach doctor.  Bring all of your medication bottles to all your appointments in  the pain clinic.  To cancel or reschedule your appointment with Pain Management please remember to call 24 hours in advance to avoid a fee.  Refer to the educational materials which you have been given on: General Risks, I had my Procedure. Discharge Instructions, Post Sedation.  Post Procedure Instructions:  The drugs you were given will stay in your system until tomorrow, so for the next 24 hours you should not drive, make any legal decisions or drink any alcoholic beverages.  You may eat anything you prefer, but it is better to start with liquids then soups and crackers, and gradually work up to solid foods.  Please notify your doctor immediately if you have any unusual bleeding, trouble breathing or pain that is not related to your normal pain.  Depending on the type of procedure that was done, some parts of your body may feel week and/or numb.  This usually clears up by tonight or the next day.  Walk with the use of an assistive device or accompanied by an adult for the 24 hours.  You may use ice on the affected area for the first 24 hours.  Put ice in a Ziploc bag and cover with a towel and place against area 15 minutes on 15 minutes off.  You may switch to heat after 24 hours. Epidural Steroid Injection An epidural steroid injection is a shot of steroid medicine and numbing medicine that is given into the space between the spinal cord and the bones in your back (epidural space). The shot helps relieve pain caused by an irritated or swollen nerve root. The amount of pain relief you get from the injection depends on what is causing the nerve to be swollen and irritated, and how long your pain lasts. You are more likely to benefit from this injection if your pain is strong and comes on suddenly rather than if you have had pain for a long time. Tell a health care provider about:  Any allergies you have.  All medicines you are taking, including vitamins, herbs, eye drops, creams, and  over-the-counter medicines.  Any problems you or family members have had with anesthetic medicines.  Any blood disorders you have.  Any surgeries you have had.  Any medical conditions you have.  Whether you are pregnant or may be pregnant. What are the risks? Generally, this is a safe procedure. However, problems may occur, including:  Headache.  Bleeding.  Infection.  Allergic reaction to medicines.  Damage to your nerves.  What happens before the procedure? Staying hydrated Follow instructions from your health care provider about hydration, which may include:  Up to 2 hours before the procedure - you may continue to drink clear liquids, such as water, clear fruit juice, black coffee, and plain tea.  Eating and drinking restrictions Follow instructions from your health care provider about eating and drinking, which may include:  8 hours before the procedure - stop eating heavy meals or foods such as meat, fried foods, or fatty foods.  6 hours before the procedure - stop eating light meals or foods, such as toast or  cereal.  6 hours before the procedure - stop drinking milk or drinks that contain milk.  2 hours before the procedure - stop drinking clear liquids.  Medicine  You may be given medicines to lower anxiety.  Ask your health care provider about: ? Changing or stopping your regular medicines. This is especially important if you are taking diabetes medicines or blood thinners. ? Taking medicines such as aspirin and ibuprofen. These medicines can thin your blood. Do not take these medicines before your procedure if your health care provider instructs you not to. General instructions  Plan to have someone take you home from the hospital or clinic. What happens during the procedure?  You may receive a medicine to help you relax (sedative).  You will be asked to lie on your abdomen.  The injection site will be cleaned.  A numbing medicine (local  anesthetic) will be used to numb the injection site.  A needle will be inserted through your skin into the epidural space. You may feel some discomfort when this happens. An X-ray machine will be used to make sure the needle is put as close as possible to the affected nerve.  A steroid medicine and a local anesthetic will be injected into the epidural space.  The needle will be removed.  A bandage (dressing) will be put over the injection site. What happens after the procedure?  Your blood pressure, heart rate, breathing rate, and blood oxygen level will be monitored until the medicines you were given have worn off.  Your arm or leg may feel weak or numb for a few hours.  The injection site may feel sore.  Do not drive for 24 hours if you received a sedative. This information is not intended to replace advice given to you by your health care provider. Make sure you discuss any questions you have with your health care provider. Document Released: 03/12/2008 Document Revised: 05/17/2016 Document Reviewed: 03/21/2016 Elsevier Interactive Patient Education  2017 [employer].

## 2017-10-03 NOTE — Progress Notes (Signed)
Nursing Pain Medication Assessment:  Safety precautions to be maintained throughout the outpatient stay will include: orient to surroundings, keep bed in low position, maintain call bell within reach at all times, provide assistance with transfer out of bed and ambulation.  Medication Inspection Compliance: Pill count conducted under aseptic conditions, in front of the patient. Neither the pills nor the bottle was removed from the patient's sight at any time. Once count was completed pills were immediately returned to the patient in their original bottle.  Medication: Hydrocodone/APAP Pill/Patch Count: 71 of 90 pills remain Pill/Patch Appearance: Markings consistent with prescribed medication Bottle Appearance: Standard pharmacy container. Clearly labeled. Filled Date: 10/11 / 2018 Last Medication intake:  Today

## 2017-10-04 ENCOUNTER — Ambulatory Visit (HOSPITAL_BASED_OUTPATIENT_CLINIC_OR_DEPARTMENT_OTHER): Payer: Medicare Other | Admitting: Pain Medicine

## 2017-10-04 ENCOUNTER — Ambulatory Visit
Admission: RE | Admit: 2017-10-04 | Discharge: 2017-10-04 | Disposition: A | Discharge status: Home / Self Care | Payer: Medicare Other | Source: Ambulatory Visit | Attending: Pain Medicine | Admitting: Pain Medicine

## 2017-10-04 ENCOUNTER — Encounter: Payer: Self-pay | Admitting: Pain Medicine

## 2017-10-04 VITALS — BP 162/96 | HR 61 | Temp 98.7°F | Resp 15 | Ht 64.0 in | Wt 173.0 lb

## 2017-10-04 DIAGNOSIS — M48062 Spinal stenosis, lumbar region with neurogenic claudication: Secondary | ICD-10-CM

## 2017-10-04 DIAGNOSIS — M4807 Spinal stenosis, lumbosacral region: Secondary | ICD-10-CM | POA: Diagnosis not present

## 2017-10-04 DIAGNOSIS — G8929 Other chronic pain: Secondary | ICD-10-CM

## 2017-10-04 DIAGNOSIS — M79604 Pain in right leg: Secondary | ICD-10-CM | POA: Diagnosis not present

## 2017-10-04 DIAGNOSIS — M9983 Other biomechanical lesions of lumbar region: Secondary | ICD-10-CM | POA: Insufficient documentation

## 2017-10-04 DIAGNOSIS — M5410 Radiculopathy, site unspecified: Secondary | ICD-10-CM

## 2017-10-04 DIAGNOSIS — M5416 Radiculopathy, lumbar region: Secondary | ICD-10-CM | POA: Diagnosis not present

## 2017-10-04 MED ORDER — SODIUM CHLORIDE 0.9% FLUSH
1.0000 mL | Freq: Once | INTRAVENOUS | Status: AC
  Administered 2017-10-04: 1 mL

## 2017-10-04 MED ORDER — LACTATED RINGERS IV SOLN
1000.0000 mL | Freq: Once | INTRAVENOUS | Status: AC
  Administered 2017-10-04: 1000 mL via INTRAVENOUS

## 2017-10-04 MED ORDER — ROPIVACAINE HCL 2 MG/ML IJ SOLN
2.0000 mL | Freq: Once | INTRAMUSCULAR | Status: AC
  Administered 2017-10-04: 10 mL via EPIDURAL

## 2017-10-04 MED ORDER — SODIUM CHLORIDE 0.9 % IJ SOLN
INTRAMUSCULAR | Status: AC
  Filled 2017-10-04: qty 10

## 2017-10-04 MED ORDER — MIDAZOLAM HCL 5 MG/5ML IJ SOLN
1.0000 mg | INTRAMUSCULAR | Status: DC | PRN
  Administered 2017-10-04: 3 mg via INTRAVENOUS

## 2017-10-04 MED ORDER — DEXAMETHASONE SODIUM PHOSPHATE 10 MG/ML IJ SOLN
10.0000 mg | Freq: Once | INTRAMUSCULAR | Status: AC
  Administered 2017-10-04: 10 mg

## 2017-10-04 MED ORDER — IOPAMIDOL (ISOVUE-M 200) INJECTION 41%
10.0000 mL | Freq: Once | INTRAMUSCULAR | Status: AC
  Administered 2017-10-04: 1 mL via EPIDURAL
  Filled 2017-10-04: qty 10

## 2017-10-04 MED ORDER — ROPIVACAINE HCL 2 MG/ML IJ SOLN
INTRAMUSCULAR | Status: AC
  Filled 2017-10-04: qty 10

## 2017-10-04 MED ORDER — DEXAMETHASONE SODIUM PHOSPHATE 10 MG/ML IJ SOLN
INTRAMUSCULAR | Status: AC
  Filled 2017-10-04: qty 1

## 2017-10-04 MED ORDER — SODIUM CHLORIDE 0.9% FLUSH
2.0000 mL | Freq: Once | INTRAVENOUS | Status: AC
  Administered 2017-10-04: 2 mL

## 2017-10-04 MED ORDER — FENTANYL CITRATE (PF) 100 MCG/2ML IJ SOLN
25.0000 ug | INTRAMUSCULAR | Status: DC | PRN
Start: 2017-10-04 — End: 2017-10-04
  Administered 2017-10-04: 100 ug via INTRAVENOUS

## 2017-10-04 MED ORDER — LIDOCAINE HCL 2 % IJ SOLN
10.0000 mL | Freq: Once | INTRAMUSCULAR | Status: AC
  Administered 2017-10-04: 200 mg

## 2017-10-04 MED ORDER — LIDOCAINE HCL (PF) 2 % IJ SOLN
INTRAMUSCULAR | Status: AC
  Filled 2017-10-04: qty 10

## 2017-10-04 MED ORDER — FENTANYL CITRATE (PF) 100 MCG/2ML IJ SOLN
INTRAMUSCULAR | Status: AC
  Filled 2017-10-04: qty 2

## 2017-10-04 MED ORDER — ROPIVACAINE HCL 2 MG/ML IJ SOLN
1.0000 mL | Freq: Once | INTRAMUSCULAR | Status: AC
  Administered 2017-10-04: 1 mL via EPIDURAL

## 2017-10-04 MED ORDER — MIDAZOLAM HCL 5 MG/5ML IJ SOLN
INTRAMUSCULAR | Status: AC
  Filled 2017-10-04: qty 5

## 2017-10-04 MED ORDER — TRIAMCINOLONE ACETONIDE 40 MG/ML IJ SUSP
INTRAMUSCULAR | Status: AC
  Filled 2017-10-04: qty 1

## 2017-10-04 MED ORDER — TRIAMCINOLONE ACETONIDE 40 MG/ML IJ SUSP
40.0000 mg | Freq: Once | INTRAMUSCULAR | Status: AC
  Administered 2017-10-04: 40 mg

## 2017-10-04 NOTE — Progress Notes (Signed)
Patient's Name: Wanda Martinez  MRN: [id number]  Referring Provider: Einar Pheasant, MD  DOB: 18-Apr-1935  PCP: Einar Pheasant, MD  DOS: 10/04/2017  Note by: Gaspar Cola, MD  Service setting: Ambulatory outpatient  Specialty: Interventional Pain Management  Patient type: Established  Location: [facility] (AMB) Pain Management Facility  Visit type: Interventional Procedure   Primary Reason for Visit: Interventional Pain Management Treatment. CC: Back Pain (lower)  Procedure:  Anesthesia, Analgesia, Anxiolysis:  Procedure #1: Type: Therapeutic Inter-Laminar Epidural Steroid Injection Region: Lumbar Level: L3-4 Level. Laterality: Midline          Procedure #2: Type: Therapeutic Trans-Foraminal Epidural Steroid Injection Region: Lumbar Level: L5-S1 Paravertebral Laterality: Right-Sided Paravertebral Position: Prone  Type: Local Anesthesia with Moderate (Conscious) Sedation Local Anesthetic: Lidocaine 1% Route: Intravenous (IV) IV Access: Secured Sedation: Meaningful verbal contact was maintained at all times during the procedure  Indication(s): Analgesia and Anxiety   Indications: 1. Chronic lumbar radicular pain (S1) (Right)   2. Lumbar spinal stenosis (with neurogenic claudication) (L3-4)   3. Lumbosacral foraminal stenosis (L5-S1) (Right)   4. Chronic lower extremity pain (Secondary Area of Pain) (Right)    Pain Score: Pre-procedure: 7 /10 Post-procedure: 0-No pain/10  Pre-op Assessment:  Wanda Martinez is a 82 y.o. (year old), female patient, seen today for interventional treatment. She  has a past surgical history that includes Breast biopsy (2013); Tonsillectomy and adenoidectomy (79); Vesicovaginal fistula closure w/ TAH (1983); Breast surgery (1986); Breast enhancement surgery (1987); Breast implant removal; Breast implant removal (Right, 08/29/2012); Mastectomy (08/2012); Abdominal hysterectomy; and Colonoscopy with propofol (N/A, 09/13/2016). Wanda Martinez has a current  medication list which includes the following prescription(s): amlodipine, aspirin, calcium carbonate, carbidopa-levodopa, citalopram, cyclobenzaprine, gabapentin, glucosamine sulfate, hydrochlorothiazide, hydrocodone-acetaminophen, hydrocodone-acetaminophen, hydrocodone-acetaminophen, letrozole, losartan, lubiprostone, magnesium oxide, meloxicam, metoprolol succinate, multivitamin, omega-3, pantoprazole, and pravastatin, and the following Facility-Administered Medications: fentanyl and midazolam. Her primarily concern today is the Back Pain (lower)  Initial Vital Signs: Last menstrual period 12/18/1981. BMI: Estimated body mass index is 29.7 kg/m as calculated from the following:   Height as of this encounter: 5\' 4"  (1.626 m).   Weight as of this encounter: 173 lb (78.5 kg).  Risk Assessment: Allergies: Reviewed. She is allergic to sulfa antibiotics and vesicare [solifenacin].  Allergy Precautions: None required Coagulopathies: Reviewed. None identified.  Blood-thinner therapy: None at this time Active Infection(s): Reviewed. None identified. Wanda Martinez is afebrile  Site Confirmation: Wanda Martinez was asked to confirm the procedure and laterality before marking the site Procedure checklist: Completed Consent: Before the procedure and under the influence of no sedative(s), amnesic(s), or anxiolytics, the patient was informed of the treatment options, risks and possible complications. To fulfill our ethical and legal obligations, as recommended by the American Medical Association's Code of Ethics, I have informed the patient of my clinical impression; the nature and purpose of the treatment or procedure; the risks, benefits, and possible complications of the intervention; the alternatives, including doing nothing; the risk(s) and benefit(s) of the alternative treatment(s) or procedure(s); and the risk(s) and benefit(s) of doing nothing. The patient was provided information about the general risks and  possible complications associated with the procedure. These may include, but are not limited to: failure to achieve desired goals, infection, bleeding, organ or nerve damage, allergic reactions, paralysis, and death. In addition, the patient was informed of those risks and complications associated to Spine-related procedures, such as failure to decrease pain; infection (i.e.: Meningitis, epidural or intraspinal abscess); bleeding (i.e.: epidural hematoma, subarachnoid hemorrhage,  or any other type of intraspinal or peri-dural bleeding); organ or nerve damage (i.e.: Any type of peripheral nerve, nerve root, or spinal cord injury) with subsequent damage to sensory, motor, and/or autonomic systems, resulting in permanent pain, numbness, and/or weakness of one or several areas of the body; allergic reactions; (i.e.: anaphylactic reaction); and/or death. Furthermore, the patient was informed of those risks and complications associated with the medications. These include, but are not limited to: allergic reactions (i.e.: anaphylactic or anaphylactoid reaction(s)); adrenal axis suppression; blood sugar elevation that in diabetics may result in ketoacidosis or comma; water retention that in patients with history of congestive heart failure may result in shortness of breath, pulmonary edema, and decompensation with resultant heart failure; weight gain; swelling or edema; medication-induced neural toxicity; particulate matter embolism and blood vessel occlusion with resultant organ, and/or nervous system infarction; and/or aseptic necrosis of one or more joints. Finally, the patient was informed that Medicine is not an exact science; therefore, there is also the possibility of unforeseen or unpredictable risks and/or possible complications that may result in a catastrophic outcome. The patient indicated having understood very clearly. We have given the patient no guarantees and we have made no promises. Enough time was  given to the patient to ask questions, all of which were answered to the patient's satisfaction. Wanda Martinez has indicated that she wanted to continue with the procedure. Attestation: I, the ordering provider, attest that I have discussed with the patient the benefits, risks, side-effects, alternatives, likelihood of achieving goals, and potential problems during recovery for the procedure that I have provided informed consent. Date: 10/04/2017; Time: 8:25 AM  Pre-Procedure Preparation:  Monitoring: As per clinic protocol. Respiration, ETCO2, SpO2, BP, heart rate and rhythm monitor placed and checked for adequate function Safety Precautions: Patient was assessed for positional comfort and pressure points before starting the procedure. Time-out: I initiated and conducted the "Time-out" before starting the procedure, as per protocol. The patient was asked to participate by confirming the accuracy of the "Time Out" information. Verification of the correct person, site, and procedure were performed and confirmed by me, the nursing staff, and the patient. "Time-out" conducted as per Joint Commission's Universal Protocol (UP.01.01.01). "Time-out" Date & Time: 10/04/2017; 1320 hrs.  Description of Procedure #1 Process:   Target Area: The  interlaminar space, initially targeting the lower border of the superior vertebral body lamina. Approach: Posterior paramedial approach. Area Prepped: Entire Posterior Lumbosacral Region Prepping solution: ChloraPrep (2% chlorhexidine gluconate and 70% isopropyl alcohol) Safety Precautions: Aspiration looking for blood return was conducted prior to all injections. At no point did we inject any substances, as a needle was being advanced. No attempts were made at seeking any paresthesias. Safe injection practices and needle disposal techniques used. Medications properly checked for expiration dates. SDV (single dose vial) medications used. Description of the Procedure:  Protocol guidelines were followed. The patient was placed in position over the fluoroscopy table. The target area was identified and the area prepped in the usual manner. Skin desensitized using vapocoolant spray. Skin & deeper tissues infiltrated with local anesthetic. Appropriate amount of time allowed to pass for local anesthetics to take effect. The procedure needle was introduced through the skin, ipsilateral to the reported pain, and advanced to the target area. Bone was contacted and the needle walked caudad, until the lamina was cleared. The ligamentum flavum was engaged and loss-of-resistance technique used as the epidural needle was advanced. The epidural space was identified using "loss-of-resistance technique" with 2-3 ml  of PF-NaCl (0.9% NSS), in a 5cc LOR glass syringe. Proper needle placement secured. Negative aspiration confirmed. Solution injected in intermittent fashion, asking for systemic symptoms every 0.5cc of injectate. The needles were then removed and the area cleansed, making sure to leave some of the prepping solution back to take advantage of its long term bactericidal properties. Start Time: 1320 hrs. Materials:  Needle(s) Type: Epidural needle Gauge: 17G Length: 3.5-in Medication(s): We administered lactated ringers, midazolam, fentaNYL, lidocaine, iopamidol, dexamethasone, ropivacaine (PF) 2 mg/mL (0.2%), sodium chloride flush, triamcinolone acetonide, ropivacaine (PF) 2 mg/mL (0.2%), sodium chloride flush, and lidocaine. Please see chart orders for dosing details.  Description of Procedure #2 Process:   Target Area: The inferior and lateral portion of the pedicle, just lateral to a line created by the 6:00 position of the pedicle and the superior articular process of the vertebral body below. On the lateral view, this target lies just posterior to the anterior aspect of the lamina and posterior to the midpoint created between the anterior and the posterior aspect of the  neural foramina. Approach: Posterior paravertebral approach. Area Prepped: Same as above Prepping solution: Same as above Safety Precautions: Same as above Description of the Procedure: Protocol guidelines were followed. The patient was placed in position over the fluoroscopy table. The target area was identified and the area prepped in the usual manner. Skin desensitized using vapocoolant spray. Skin & deeper tissues infiltrated with local anesthetic. Appropriate amount of time allowed to pass for local anesthetics to take effect. The procedure needles were then advanced to the target area. Proper needle placement secured. Negative aspiration confirmed. Solution injected in intermittent fashion, asking for systemic symptoms every 0.2cc of injectate. The needles were then removed and the area cleansed, making sure to leave some of the prepping solution back to take advantage of its long term bactericidal properties. Vitals:   10/04/17 1351 10/04/17 1401 10/04/17 1412 10/04/17 1414  BP: (!) 153/80 (!) 153/78 (!) 128/111 (!) 162/96  Pulse:      Resp: 14 10 15    Temp:      SpO2: 97% 99% 99%   Weight:      Height:        End Time: 1339 hrs. Materials:  Needle(s) Type: Regular needle Gauge: 22G Length: 3.5-in Medication(s): We administered lactated ringers, midazolam, fentaNYL, lidocaine, iopamidol, dexamethasone, ropivacaine (PF) 2 mg/mL (0.2%), sodium chloride flush, triamcinolone acetonide, ropivacaine (PF) 2 mg/mL (0.2%), sodium chloride flush, and lidocaine. Please see chart orders for dosing details.  Imaging Guidance (Spinal):  Type of Imaging Technique: Fluoroscopy Guidance (Spinal) Indication(s): Assistance in needle guidance and placement for procedures requiring needle placement in or near specific anatomical locations not easily accessible without such assistance. Exposure Time: Please see nurses notes. Contrast: Before injecting any contrast, we confirmed that the patient did not  have an allergy to iodine, shellfish, or radiological contrast. Once satisfactory needle placement was completed at the desired level, radiological contrast was injected. Contrast injected under live fluoroscopy. No contrast complications. See chart for type and volume of contrast used. Fluoroscopic Guidance: I was personally present during the use of fluoroscopy. "Tunnel Vision Technique" used to obtain the best possible view of the target area. Parallax error corrected before commencing the procedure. "Direction-depth-direction" technique used to introduce the needle under continuous pulsed fluoroscopy. Once target was reached, antero-posterior, oblique, and lateral fluoroscopic projection used confirm needle placement in all planes. Images permanently stored in EMR. Interpretation: I personally interpreted the imaging intraoperatively. Adequate needle placement confirmed  in multiple planes. Appropriate spread of contrast into desired area was observed. No evidence of afferent or efferent intravascular uptake. No intrathecal or subarachnoid spread observed. Permanent images saved into the patient's record.  Antibiotic Prophylaxis:  Indication(s): None identified Antibiotic given: None  Post-operative Assessment:  EBL: None Complications: No immediate post-treatment complications observed by team, or reported by patient. Note: The patient tolerated the entire procedure well. A repeat set of vitals were taken after the procedure and the patient was kept under observation following institutional policy, for this type of procedure. Post-procedural neurological assessment was performed, showing return to baseline, prior to discharge. The patient was provided with post-procedure discharge instructions, including a section on how to identify potential problems. Should any problems arise concerning this procedure, the patient was given instructions to immediately contact us, at any time, without hesitation. In  any case, we plan to contact the patient by telephone for a follow-up status report regarding this interventional procedure. Comments:  No additional relevant information.  Plan of Care    Imaging Orders     DG C-Arm 1-60 Min-No Report  Procedure Orders     Lumbar Epidural Injection     Lumbar Transforaminal Epidural  Medications ordered for procedure: Meds ordered this encounter  Medications  . lactated ringers infusion 1,000 mL  . midazolam (VERSED) 5 MG/5ML injection 1-2 mg    Make sure Flumazenil is available in the pyxis when using this medication. If oversedation occurs, administer 0.2 mg IV over 15 sec. If after 45 sec no response, administer 0.2 mg again over 1 min; may repeat at 1 min intervals; not to exceed 4 doses (1 mg)  . fentaNYL (SUBLIMAZE) injection 25-50 mcg    Make sure Narcan is available in the pyxis when using this medication. In the event of respiratory depression (RR< 8/min): Titrate NARCAN (naloxone) in increments of 0.1 to 0.2 mg IV at 2-3 minute intervals, until desired degree of reversal.  . lidocaine (XYLOCAINE) 2 % (with pres) injection 200 mg  . iopamidol (ISOVUE-M) 41 % intrathecal injection 10 mL  . dexamethasone (DECADRON) injection 10 mg  . ropivacaine (PF) 2 mg/mL (0.2%) (NAROPIN) injection 1 mL  . sodium chloride flush (NS) 0.9 % injection 1 mL  . triamcinolone acetonide (KENALOG-40) injection 40 mg  . ropivacaine (PF) 2 mg/mL (0.2%) (NAROPIN) injection 2 mL  . sodium chloride flush (NS) 0.9 % injection 2 mL  . lidocaine (XYLOCAINE) 2 % (with pres) injection 200 mg   Medications administered: We administered lactated ringers, midazolam, fentaNYL, lidocaine, iopamidol, dexamethasone, ropivacaine (PF) 2 mg/mL (0.2%), sodium chloride flush, triamcinolone acetonide, ropivacaine (PF) 2 mg/mL (0.2%), sodium chloride flush, and lidocaine.  See the medical record for exact dosing, route, and time of administration.  New Prescriptions   No medications  on file   Disposition: Discharge home  Discharge Date & Time: 10/04/2017; 1420 hrs.   Physician-requested Follow-up: Return for post-procedure eval by Dr. Dossie Arbour in 2 wks. Future Appointments Date Time Provider [place]  10/09/2017 1:45 PM Lloyd Huger, MD [facility] None  10/25/2017 10:30 AM Milinda Pointer, MD [facility] None  10/26/2017 1:00 PM O'Brien-Blaney, Denisa L, LPN [facility] None  [id number] 11:30 AM Einar Pheasant, MD [facility] None   Primary Care Physician: Einar Pheasant, MD Location: [facility] Note by: Gaspar Cola, MD Date: 10/04/2017; Time: 2:35 PM  Disclaimer:  Medicine is not an Chief Strategy Officer. The only guarantee in medicine is that nothing is guaranteed. It is important  to note that the decision to proceed with this intervention was based on the information collected from the patient. The Data and conclusions were drawn from the patient's questionnaire, the interview, and the physical examination. Because the information was provided in large part by the patient, it cannot be guaranteed that it has not been purposely or unconsciously manipulated. Every effort has been made to obtain as much relevant data as possible for this evaluation. It is important to note that the conclusions that lead to this procedure are derived in large part from the available data. Always take into account that the treatment will also be dependent on availability of resources and existing treatment guidelines, considered by other Pain Management Practitioners as being common knowledge and practice, at the time of the intervention. For Medico-Legal purposes, it is also important to point out that variation in procedural techniques and pharmacological choices are the acceptable norm. The indications, contraindications, technique, and results of the above procedure should only be interpreted and judged by a Board-Certified Interventional Pain  Specialist with extensive familiarity and expertise in the same exact procedure and technique.

## 2017-10-04 NOTE — Patient Instructions (Signed)

## 2017-10-05 ENCOUNTER — Telehealth: Payer: Self-pay

## 2017-10-05 NOTE — Telephone Encounter (Signed)
Denies any pain needs at this time. States she is having sinsus/cold issues today. Not running a temperature. Instructed pt to call if needed and if worse she could go to the ER.

## 2017-10-06 NOTE — Progress Notes (Signed)
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  INTERVAL HISTORY: Patient returns to clinic today for routine 6 month followup. Her Parkinson's symptoms continue to be well controlled with her current medication regimen. She is tolerating letrozole without significant side effects. She has significant back pain and is considering surgery. She has no new neurologic complaints.  She denies any recent fevers.  She has a good appetite and denies weight loss.  She denies any chest pain or shortness of breath.  She denies any nausea, vomiting, constipation, or diarrhea.  She has no urinary complaints.  Patient offers no further specific complaints today.   REVIEW OF SYSTEMS:   Review of Systems  Constitutional: Negative.  Negative for fever, malaise/fatigue and weight loss.  Respiratory: Negative.  Negative for cough and shortness of breath.   Cardiovascular: Negative.  Negative for chest pain and leg swelling.  Gastrointestinal: Negative.  Negative for abdominal pain.  Genitourinary: Negative.   Musculoskeletal: Positive for back pain and joint pain.  Skin: Negative.  Negative for rash.  Neurological: Positive for tremors and speech change. Negative for weakness.  Psychiatric/Behavioral: Negative.  Negative for depression. The patient is not nervous/anxious.     As per HPI. Otherwise, a complete review of systems is negative.  PAST MEDICAL HISTORY: Past Medical History:  Diagnosis Date  . Anemia   . Arm pain 07/26/2015  . Arthritis   . Arthritis, degenerative 03/26/2014  . Back pain 11/01/2013  . Breast cancer ([place])    Masectomy - left - 1986   . Breast cancer [facility])    Mastectomy-right  -2014  . CHEST PAIN 04/29/2010   Qualifier: Diagnosis of  By: Wynetta Emery RN, Doroteo Bradford    . Chronic cystitis   . Cystocele 02/22/2013  . Cystocele, midline 08/19/2013  . Degeneration of intervertebral disc of lumbosacral region 03/26/2014  . DYSPNEA 04/29/2010   Qualifier: Diagnosis of  By: Wynetta Emery RN, Doroteo Bradford    . Enthesopathy of hip 03/26/2014  . GERD (gastroesophageal reflux disease)   . Hiatal hernia   . HTN (hypertension)   . LBP (low back pain) 03/26/2014  . Neck pain 11/01/2013  . Parkinson disease ([place])   . Sinusitis 02/07/2015  . Skin lesions 07/12/2014  . Urinary incontinence    mixed     PAST SURGICAL HISTORY: Past Surgical History:  Procedure Laterality Date  . ABDOMINAL HYSTERECTOMY    . BREAST BIOPSY  2013  . BREAST ENHANCEMENT SURGERY  1987  . BREAST IMPLANT REMOVAL    . BREAST IMPLANT REMOVAL Right 08/29/2012  . BREAST SURGERY  1986   s/p left mastectomy  . COLONOSCOPY WITH PROPOFOL N/A 09/13/2016   Procedure: COLONOSCOPY WITH PROPOFOL;  Surgeon: Manya Silvas, MD;  Location: [facility] ENDOSCOPY;  Service: Endoscopy;  Laterality: N/A;  . MASTECTOMY  08/2012   right  . TONSILLECTOMY AND ADENOIDECTOMY  79  . VESICOVAGINAL FISTULA CLOSURE W/ TAH  1983    FAMILY HISTORY Family History  Problem Relation Age of Onset  . Diabetes Father   . Stroke Father   . Colon polyps Father   . Stroke Mother   . Parkinson's disease Mother        ADVANCED DIRECTIVES:    HEALTH MAINTENANCE: Social History  Substance Use Topics  . Smoking status: Never Smoker  . Smokeless tobacco: Never Used     Comment: tobacco use - no  . Alcohol use No     Allergies  Allergen Reactions  . Sulfa Antibiotics Nausea Only  . Vesicare [Solifenacin] Other (See Comments)    Constipation Constipation    Current Outpatient Prescriptions  Medication Sig Dispense Refill  . amLODipine (NORVASC) 2.5 MG tablet TAKE 1 TABLET BY MOUTH ONCE DAILY. 30 tablet 5  . aspirin 81 MG EC tablet Take 81 mg by mouth  daily as needed.      . Calcium Carbonate (CALCIUM 600) 1500 MG TABS Take 1 tablet by mouth daily.      . carbidopa-levodopa (SINEMET IR) 25-100 MG tablet Take by mouth.    . citalopram (CELEXA) 20 MG tablet TAKE 1 & 1/2 TABLETS BY MOUTH ONCE DAILY    . [START ON 10/26/2017] cyclobenzaprine (FLEXERIL) 10 MG tablet Take 1 tablet (10 mg total) by mouth at bedtime. 90 tablet 0  . [START ON 10/26/2017] gabapentin (NEURONTIN) 300 MG capsule Take 1 capsule (300 mg total) by mouth 2 (two) times daily. 180 capsule 0  . GLUCOSAMINE SULFATE PO Take by mouth daily.     . hydrochlorothiazide (HYDRODIURIL) 25 MG tablet TAKE 1 TABLET BY MOUTH ONCE DAILY. (PATIENT TAKE 1/2 ONCE DAILY AND EXTRA 1/2 IF NEEDED) 15 tablet 4  . [START ON 11/25/2017] HYDROcodone-acetaminophen (NORCO/VICODIN) 5-325 MG tablet Take 1 tablet by mouth every 8 (eight) hours as needed for severe pain. 90 tablet 0  . [START ON 10/26/2017] HYDROcodone-acetaminophen (NORCO/VICODIN) 5-325 MG tablet Take 1 tablet by mouth every 8 (eight) hours as needed for severe pain. 90 tablet 0  . [START ON 12/25/2017] HYDROcodone-acetaminophen (NORCO/VICODIN) 5-325 MG tablet Take 1 tablet by mouth every 8 (eight) hours as needed for severe pain. 90 tablet 0  . letrozole (FEMARA) 2.5 MG tablet TAKE 1 TABLET BY MOUTH ONCE DAILY. 90 tablet 3  . losartan (COZAAR) 100 MG tablet Take 1 tablet (100 mg total) by mouth daily. 30 tablet 2  . lubiprostone (AMITIZA) 24 MCG capsule TAKE 1 CAPSULE BY MOUTH ONCE DAILY WITH BREAKFAST.    Derrill Memo ON 10/26/2017] magnesium oxide (MAGNESIUM-OXIDE) 400 (241.3 Mg) MG tablet Take 1 tablet (400 mg total) by mouth daily. 90 tablet 0  . [START ON 10/26/2017] meloxicam (MOBIC) 7.5 MG tablet Take 1 tablet (7.5 mg total) by mouth daily. 90 tablet 0  . metoprolol succinate (TOPROL-XL) 25 MG 24 hr tablet Take 1 tablet (25 mg total) by mouth 2 (two) times daily. 60 tablet 2  . Multiple Vitamin (MULTIVITAMIN) tablet Take 1 tablet by mouth daily.      . Omega-3 1000 MG CAPS Take by mouth daily.     . pantoprazole (PROTONIX) 40 MG tablet Take 1 tablet (40 mg total) by mouth daily. 30 tablet 2  . pravastatin (PRAVACHOL) 20 MG tablet Take 1 tablet (20 mg total) by mouth daily. 30 tablet 2   No current facility-administered medications for this visit.     OBJECTIVE: Vitals:   10/09/17 1353  BP: (!) 155/94  Pulse: 66  Resp: 18  Temp: 99 F (37.2 C)     Body mass index is 29.87 kg/m.    ECOG FS:0 - Asymptomatic  General: Well-developed, well-nourished, no acute distress. Eyes: Pink conjunctiva, anicteric sclera. Breasts: Bilateral mastectomy, no evidence of recurrence. Lungs: Clear to auscultation bilaterally. Heart: Regular rate and rhythm. No rubs, murmurs, or gallops.  Abdomen: Soft, nontender, nondistended. No organomegaly noted, normoactive bowel sounds. Musculoskeletal: No edema, cyanosis, or clubbing. Neuro: Alert, answering all questions appropriately. Cranial nerves grossly intact. Skin: No rashes or petechiae noted. Psych: Normal affect.   LAB RESULTS:  Lab Results  Component Value Date   NA 136 09/20/2017   K 4.3 09/20/2017   CL 98 09/20/2017   CO2 30 09/20/2017   GLUCOSE 81 09/20/2017   BUN 15 09/20/2017   CREATININE 0.92 09/20/2017   CALCIUM 9.8 09/20/2017   PROT 7.3 09/20/2017   ALBUMIN 4.0 09/20/2017   AST 18 09/20/2017   ALT 18 09/20/2017   ALKPHOS 51 09/20/2017   BILITOT 0.4 09/20/2017   GFRNONAA >60 10/08/2014   GFRAA >60 10/08/2014    Lab Results  Component Value Date   WBC 5.5 09/20/2017   NEUTROABS 3.3 09/20/2017   HGB 12.0 09/20/2017   HCT 35.8 (L) 09/20/2017   MCV 89.4 09/20/2017   PLT 293.0 09/20/2017     STUDIES: Dg C-arm 1-60 Min-no Report  Result Date: 10/04/2017 Fluoroscopy was utilized by the requesting physician.  No radiographic interpretation.    ASSESSMENT: Stage IIa ER/PR positive, HER-2 negative adenocarcinoma of the upper inner quadrant of the right  breast.  PLAN:    1.  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 Continue letrozole completing 5 years of therapy in May 2019. Patient has had bilateral mastectomies, therefore no further mammograms are necessary. Patient finished her chemotherapy in January 2014. She did not require XRT given her bilateral mastectomies. Return to clinic in 6 months for routine evaluation. 2. Osteopenia: Patient's most recent bone mineral density on June 25, 2017 was reported as -2.3 which is unchanged from one year prior. After lengthy discussion with the patient and given her potential surgery on her back, will proceed with Prolia next week. Continue Calcium and Vitamin D. Repeat in July 2020.  3.  Tremors/Parkinson's: Stable. Treatment per neurology.   Patient expressed understanding and was in agreement with this plan. She also understands that She can call clinic at any time with any questions, concerns, or complaints.   Breast cancer [facility])   Staging form: Breast, AJCC 7th Edition     Clinical stage from 09/22/2015: Stage IIA (T1c, N1, M0) - Signed by Lloyd Huger, MD on 09/22/2015   Lloyd Huger, MD   10/09/2017 2:00 PM

## 2017-10-09 ENCOUNTER — Inpatient Hospital Stay: Payer: Medicare Other | Attending: Oncology | Admitting: Oncology

## 2017-10-09 VITALS — BP 155/94 | HR 66 | Temp 99.0°F | Resp 18 | Wt 174.0 lb

## 2017-10-09 DIAGNOSIS — K219 Gastro-esophageal reflux disease without esophagitis: Secondary | ICD-10-CM

## 2017-10-09 DIAGNOSIS — N3020 Other chronic cystitis without hematuria: Secondary | ICD-10-CM

## 2017-10-09 DIAGNOSIS — M545 Low back pain: Secondary | ICD-10-CM | POA: Diagnosis not present

## 2017-10-09 DIAGNOSIS — Z79811 Long term (current) use of aromatase inhibitors: Secondary | ICD-10-CM | POA: Diagnosis not present

## 2017-10-09 DIAGNOSIS — Z86010 Personal history of colonic polyps: Secondary | ICD-10-CM | POA: Diagnosis not present

## 2017-10-09 DIAGNOSIS — M8589 Other specified disorders of bone density and structure, multiple sites: Secondary | ICD-10-CM

## 2017-10-09 DIAGNOSIS — Z9012 Acquired absence of left breast and nipple: Secondary | ICD-10-CM

## 2017-10-09 DIAGNOSIS — I10 Essential (primary) hypertension: Secondary | ICD-10-CM | POA: Diagnosis not present

## 2017-10-09 DIAGNOSIS — Z79899 Other long term (current) drug therapy: Secondary | ICD-10-CM

## 2017-10-09 DIAGNOSIS — D649 Anemia, unspecified: Secondary | ICD-10-CM | POA: Diagnosis not present

## 2017-10-09 DIAGNOSIS — Z7982 Long term (current) use of aspirin: Secondary | ICD-10-CM | POA: Diagnosis not present

## 2017-10-09 DIAGNOSIS — R32 Unspecified urinary incontinence: Secondary | ICD-10-CM

## 2017-10-09 DIAGNOSIS — C7951 Secondary malignant neoplasm of bone: Secondary | ICD-10-CM | POA: Diagnosis not present

## 2017-10-09 DIAGNOSIS — M542 Cervicalgia: Secondary | ICD-10-CM | POA: Diagnosis not present

## 2017-10-09 DIAGNOSIS — Z170 Estrogen receptor positive status [ER+]: Secondary | ICD-10-CM

## 2017-10-09 DIAGNOSIS — M8580 Other specified disorders of bone density and structure, unspecified site: Secondary | ICD-10-CM | POA: Diagnosis not present

## 2017-10-09 DIAGNOSIS — Z7951 Long term (current) use of inhaled steroids: Secondary | ICD-10-CM | POA: Diagnosis not present

## 2017-10-09 DIAGNOSIS — C50211 Malignant neoplasm of upper-inner quadrant of right female breast: Secondary | ICD-10-CM

## 2017-10-09 DIAGNOSIS — K449 Diaphragmatic hernia without obstruction or gangrene: Secondary | ICD-10-CM | POA: Diagnosis not present

## 2017-10-09 DIAGNOSIS — G20 Parkinson's disease: Secondary | ICD-10-CM

## 2017-10-09 NOTE — Progress Notes (Signed)
Patient here today for follow up regarding breast cancer. Patient reports she is having issues with chronic back pain, followed by the pain clinic.

## 2017-10-10 DIAGNOSIS — A499 Bacterial infection, unspecified: Secondary | ICD-10-CM | POA: Diagnosis not present

## 2017-10-10 DIAGNOSIS — N390 Urinary tract infection, site not specified: Secondary | ICD-10-CM | POA: Diagnosis not present

## 2017-10-10 DIAGNOSIS — M810 Age-related osteoporosis without current pathological fracture: Secondary | ICD-10-CM | POA: Diagnosis not present

## 2017-10-10 DIAGNOSIS — M4120 Other idiopathic scoliosis, site unspecified: Secondary | ICD-10-CM | POA: Diagnosis not present

## 2017-10-10 DIAGNOSIS — Z6827 Body mass index (BMI) 27.0-27.9, adult: Secondary | ICD-10-CM | POA: Diagnosis not present

## 2017-10-10 DIAGNOSIS — I10 Essential (primary) hypertension: Secondary | ICD-10-CM | POA: Diagnosis not present

## 2017-10-10 DIAGNOSIS — R300 Dysuria: Secondary | ICD-10-CM | POA: Diagnosis not present

## 2017-10-10 DIAGNOSIS — M5416 Radiculopathy, lumbar region: Secondary | ICD-10-CM | POA: Diagnosis not present

## 2017-10-10 DIAGNOSIS — B373 Candidiasis of vulva and vagina: Secondary | ICD-10-CM | POA: Diagnosis not present

## 2017-10-10 DIAGNOSIS — M545 Low back pain: Secondary | ICD-10-CM | POA: Diagnosis not present

## 2017-10-16 ENCOUNTER — Inpatient Hospital Stay: Payer: Medicare Other

## 2017-10-16 DIAGNOSIS — M8589 Other specified disorders of bone density and structure, multiple sites: Secondary | ICD-10-CM

## 2017-10-16 DIAGNOSIS — Z170 Estrogen receptor positive status [ER+]: Secondary | ICD-10-CM | POA: Diagnosis not present

## 2017-10-16 DIAGNOSIS — Z79811 Long term (current) use of aromatase inhibitors: Secondary | ICD-10-CM | POA: Diagnosis not present

## 2017-10-16 DIAGNOSIS — C50211 Malignant neoplasm of upper-inner quadrant of right female breast: Secondary | ICD-10-CM

## 2017-10-16 DIAGNOSIS — Z79899 Other long term (current) drug therapy: Secondary | ICD-10-CM | POA: Diagnosis not present

## 2017-10-16 DIAGNOSIS — M8580 Other specified disorders of bone density and structure, unspecified site: Secondary | ICD-10-CM | POA: Diagnosis not present

## 2017-10-16 DIAGNOSIS — C7951 Secondary malignant neoplasm of bone: Secondary | ICD-10-CM | POA: Diagnosis not present

## 2017-10-16 LAB — BASIC METABOLIC PANEL
Anion gap: 9 (ref 5–15)
BUN: 20 mg/dL (ref 6–20)
CALCIUM: 9 mg/dL (ref 8.9–10.3)
CO2: 25 mmol/L (ref 22–32)
Chloride: 96 mmol/L — ABNORMAL LOW (ref 101–111)
Creatinine, Ser: 0.91 mg/dL (ref 0.44–1.00)
GFR calc Af Amer: >60 mL/min (ref >60)
GFR, EST NON AFRICAN AMERICAN: 58 mL/min — AB (ref >60)
Glucose, Bld: 90 mg/dL (ref 65–99)
POTASSIUM: 3.5 mmol/L (ref 3.5–5.1)
SODIUM: 130 mmol/L — AB (ref 135–145)

## 2017-10-16 MED ORDER — DENOSUMAB 60 MG/ML ~~LOC~~ SOLN
60.0000 mg | Freq: Once | SUBCUTANEOUS | Status: AC
  Administered 2017-10-16: 60 mg via SUBCUTANEOUS
  Filled 2017-10-16: qty 1

## 2017-10-22 DIAGNOSIS — G20 Parkinson's disease: Secondary | ICD-10-CM | POA: Diagnosis not present

## 2017-10-22 DIAGNOSIS — M7061 Trochanteric bursitis, right hip: Secondary | ICD-10-CM | POA: Diagnosis not present

## 2017-10-22 DIAGNOSIS — M542 Cervicalgia: Secondary | ICD-10-CM | POA: Diagnosis not present

## 2017-10-22 DIAGNOSIS — R251 Tremor, unspecified: Secondary | ICD-10-CM | POA: Diagnosis not present

## 2017-10-22 DIAGNOSIS — M7062 Trochanteric bursitis, left hip: Secondary | ICD-10-CM

## 2017-10-25 ENCOUNTER — Other Ambulatory Visit: Payer: Self-pay | Admitting: Internal Medicine

## 2017-10-25 ENCOUNTER — Other Ambulatory Visit: Payer: Self-pay

## 2017-10-25 ENCOUNTER — Encounter: Payer: Self-pay | Admitting: Pain Medicine

## 2017-10-25 ENCOUNTER — Ambulatory Visit (HOSPITAL_BASED_OUTPATIENT_CLINIC_OR_DEPARTMENT_OTHER): Payer: Medicare Other | Admitting: Pain Medicine

## 2017-10-25 ENCOUNTER — Ambulatory Visit
Admission: RE | Admit: 2017-10-25 | Discharge: 2017-10-25 | Disposition: A | Discharge status: Home / Self Care | Payer: Medicare Other | Source: Ambulatory Visit | Attending: Pain Medicine | Admitting: Pain Medicine

## 2017-10-25 VITALS — BP 164/77 | HR 57 | Temp 98.1°F | Resp 16 | Ht 64.0 in | Wt 172.0 lb

## 2017-10-25 DIAGNOSIS — G8929 Other chronic pain: Secondary | ICD-10-CM | POA: Diagnosis not present

## 2017-10-25 DIAGNOSIS — M545 Low back pain: Secondary | ICD-10-CM | POA: Diagnosis not present

## 2017-10-25 DIAGNOSIS — G8918 Other acute postprocedural pain: Secondary | ICD-10-CM

## 2017-10-25 DIAGNOSIS — Z888 Allergy status to other drugs, medicaments and biological substances status: Secondary | ICD-10-CM | POA: Diagnosis not present

## 2017-10-25 DIAGNOSIS — M533 Sacrococcygeal disorders, not elsewhere classified: Secondary | ICD-10-CM

## 2017-10-25 DIAGNOSIS — Z9010 Acquired absence of unspecified breast and nipple: Secondary | ICD-10-CM | POA: Diagnosis not present

## 2017-10-25 DIAGNOSIS — M47816 Spondylosis without myelopathy or radiculopathy, lumbar region: Secondary | ICD-10-CM | POA: Insufficient documentation

## 2017-10-25 DIAGNOSIS — Z79891 Long term (current) use of opiate analgesic: Secondary | ICD-10-CM | POA: Diagnosis not present

## 2017-10-25 DIAGNOSIS — Z79899 Other long term (current) drug therapy: Secondary | ICD-10-CM | POA: Insufficient documentation

## 2017-10-25 DIAGNOSIS — Z882 Allergy status to sulfonamides status: Secondary | ICD-10-CM | POA: Diagnosis not present

## 2017-10-25 DIAGNOSIS — M488X6 Other specified spondylopathies, lumbar region: Secondary | ICD-10-CM | POA: Diagnosis not present

## 2017-10-25 DIAGNOSIS — Z90710 Acquired absence of both cervix and uterus: Secondary | ICD-10-CM | POA: Diagnosis not present

## 2017-10-25 DIAGNOSIS — M549 Dorsalgia, unspecified: Secondary | ICD-10-CM | POA: Diagnosis present

## 2017-10-25 HISTORY — DX: Other acute postprocedural pain: G89.18

## 2017-10-25 MED ORDER — FENTANYL CITRATE (PF) 100 MCG/2ML IJ SOLN
25.0000 ug | INTRAMUSCULAR | Status: DC | PRN
  Administered 2017-10-25: 100 ug via INTRAVENOUS
  Filled 2017-10-25: qty 2

## 2017-10-25 MED ORDER — OXYCODONE-ACETAMINOPHEN 5-325 MG PO TABS: 1.0000 | ORAL_TABLET | Freq: Three times a day (TID) | ORAL | 0 refills | Status: AC | PRN

## 2017-10-25 MED ORDER — ROPIVACAINE HCL 2 MG/ML IJ SOLN
9.0000 mL | Freq: Once | INTRAMUSCULAR | Status: AC
  Administered 2017-10-25: 10 mL via PERINEURAL
  Filled 2017-10-25: qty 10

## 2017-10-25 MED ORDER — MIDAZOLAM HCL 5 MG/5ML IJ SOLN
1.0000 mg | INTRAMUSCULAR | Status: DC | PRN
  Administered 2017-10-25: 5 mg via INTRAVENOUS
  Filled 2017-10-25: qty 5

## 2017-10-25 MED ORDER — LACTATED RINGERS IV SOLN: 1000.0000 mL | Freq: Once | INTRAVENOUS | Status: DC

## 2017-10-25 MED ORDER — LIDOCAINE HCL 2 % IJ SOLN
10.0000 mL | Freq: Once | INTRAMUSCULAR | Status: AC
  Administered 2017-10-25: 400 mg
  Filled 2017-10-25: qty 20

## 2017-10-25 MED ORDER — TRIAMCINOLONE ACETONIDE 40 MG/ML IJ SUSP
40.0000 mg | Freq: Once | INTRAMUSCULAR | Status: AC
  Administered 2017-10-25: 40 mg
  Filled 2017-10-25: qty 1

## 2017-10-25 NOTE — Progress Notes (Signed)
Patient's Name: Wanda Martinez  MRN: [id number]  Referring Provider: Einar Pheasant, MD  DOB: 05/31/1935  PCP: Einar Pheasant, MD  DOS: 10/25/2017  Note by: Gaspar Cola, MD  Service setting: Ambulatory outpatient  Specialty: Interventional Pain Management  Patient type: Established  Location: [facility] (AMB) Pain Management Facility  Visit type: Interventional Procedure   Primary Reason for Visit: Interventional Pain Management Treatment. CC: Back Pain (right side)  Procedure:  Anesthesia, Analgesia, Anxiolysis:  Type: Therapeutic Medial Branch Facet & Sacroiliac joint Radiofrequency Ablation Region: Lumbosacral Level: L2, L3, L4, L5, S1, S2, & S3 Medial Branch Level(s) Laterality: Right-Sided  Type: Local Anesthesia with Moderate (Conscious) Sedation Local Anesthetic: Lidocaine 1% Route: Intravenous (IV) IV Access: Secured Sedation: Meaningful verbal contact was maintained at all times during the procedure  Indication(s): Analgesia and Anxiety   Indications: 1. Lumbar facet syndrome (Bilateral) (R>L)   2. Chronic low back pain (Primary Source of Pain) (Bilateral) (R>L)   3. Lumbar spondylosis   4. Chronic sacroiliac joint pain (Right)   5. Acute postoperative pain   6. Facet syndrome, lumbar   7. Chronic bilateral low back pain without sciatica   8. Chronic right sacroiliac joint pain    Wanda Martinez has either failed to respond, was unable to tolerate, or simply did not get enough benefit from other more conservative therapies including, but not limited to: 1. Over-the-counter medications 2. Anti-inflammatory medications 3. Muscle relaxants 4. Membrane stabilizers 5. Opioids 6. Physical therapy 7. Modalities (Heat, ice, etc.) 8. Invasive techniques such as nerve blocks. Ms. Gallego has attained more than 50% relief of the pain from a series of diagnostic injections conducted in separate occasions.  Pain Score: Pre-procedure: 6 /10 Post-procedure: 0-No  pain/10  Pre-op Assessment:  Wanda Martinez is a 82 y.o. (year old), female patient, seen today for interventional treatment. She  has a past surgical history that includes Breast biopsy (2013); Tonsillectomy and adenoidectomy (79); Vesicovaginal fistula closure w/ TAH (1983); Breast surgery (1986); Breast enhancement surgery (1987); Breast implant removal; Breast implant removal (Right, 08/29/2012); Mastectomy (08/2012); Abdominal hysterectomy; and COLONOSCOPY WITH PROPOFOL (N/A, 09/13/2016). Wanda Martinez has a current medication list which includes the following prescription(s): amlodipine, aspirin, calcium carbonate, carbidopa-levodopa, citalopram, cyclobenzaprine, gabapentin, glucosamine sulfate, hydrochlorothiazide, hydrocodone-acetaminophen, hydrocodone-acetaminophen, hydrocodone-acetaminophen, letrozole, losartan, lubiprostone, magnesium oxide, meloxicam, metoprolol succinate, multivitamin, omega-3, oxycodone-acetaminophen, pantoprazole, and pravastatin, and the following Facility-Administered Medications: fentanyl, lactated ringers, and midazolam. Her primarily concern today is the Back Pain (right side)  Initial Vital Signs: Last menstrual period 12/18/1981. BMI: Estimated body mass index is 29.52 kg/m as calculated from the following:   Height as of this encounter: 5\' 4"  (1.626 m).   Weight as of this encounter: 172 lb (78 kg).  Risk Assessment: Allergies: Reviewed. She is allergic to sulfa antibiotics and vesicare [solifenacin].  Allergy Precautions: None required Coagulopathies: Reviewed. None identified.  Blood-thinner therapy: None at this time Active Infection(s): Reviewed. None identified. Wanda Martinez is afebrile  Site Confirmation: Wanda Martinez was asked to confirm the procedure and laterality before marking the site Procedure checklist: Completed Consent: Before the procedure and under the influence of no sedative(s), amnesic(s), or anxiolytics, the patient was informed of the treatment  options, risks and possible complications. To fulfill our ethical and legal obligations, as recommended by the American Medical Association's Code of Ethics, I have informed the patient of my clinical impression; the nature and purpose of the treatment or procedure; the risks, benefits, and possible complications of the intervention; the alternatives, including doing  nothing; the risk(s) and benefit(s) of the alternative treatment(s) or procedure(s); and the risk(s) and benefit(s) of doing nothing. The patient was provided information about the general risks and possible complications associated with the procedure. These may include, but are not limited to: failure to achieve desired goals, infection, bleeding, organ or nerve damage, allergic reactions, paralysis, and death. In addition, the patient was informed of those risks and complications associated to Spine-related procedures, such as failure to decrease pain; infection (i.e.: Meningitis, epidural or intraspinal abscess); bleeding (i.e.: epidural hematoma, subarachnoid hemorrhage, or any other type of intraspinal or peri-dural bleeding); organ or nerve damage (i.e.: Any type of peripheral nerve, nerve root, or spinal cord injury) with subsequent damage to sensory, motor, and/or autonomic systems, resulting in permanent pain, numbness, and/or weakness of one or several areas of the body; allergic reactions; (i.e.: anaphylactic reaction); and/or death. Furthermore, the patient was informed of those risks and complications associated with the medications. These include, but are not limited to: allergic reactions (i.e.: anaphylactic or anaphylactoid reaction(s)); adrenal axis suppression; blood sugar elevation that in diabetics may result in ketoacidosis or comma; water retention that in patients with history of congestive heart failure may result in shortness of breath, pulmonary edema, and decompensation with resultant heart failure; weight gain; swelling or  edema; medication-induced neural toxicity; particulate matter embolism and blood vessel occlusion with resultant organ, and/or nervous system infarction; and/or aseptic necrosis of one or more joints. Finally, the patient was informed that Medicine is not an exact science; therefore, there is also the possibility of unforeseen or unpredictable risks and/or possible complications that may result in a catastrophic outcome. The patient indicated having understood very clearly. We have given the patient no guarantees and we have made no promises. Enough time was given to the patient to ask questions, all of which were answered to the patient's satisfaction. Ms. Lockner has indicated that she wanted to continue with the procedure. Attestation: I, the ordering provider, attest that I have discussed with the patient the benefits, risks, side-effects, alternatives, likelihood of achieving goals, and potential problems during recovery for the procedure that I have provided informed consent. Date: 10/25/2017; Time: 7:09 AM  Pre-Procedure Preparation:  Monitoring: As per clinic protocol. Respiration, ETCO2, SpO2, BP, heart rate and rhythm monitor placed and checked for adequate function Safety Precautions: Patient was assessed for positional comfort and pressure points before starting the procedure. Time-out: I initiated and conducted the "Time-out" before starting the procedure, as per protocol. The patient was asked to participate by confirming the accuracy of the "Time Out" information. Verification of the correct person, site, and procedure were performed and confirmed by me, the nursing staff, and the patient. "Time-out" conducted as per Joint Commission's Universal Protocol (UP.01.01.01). "Time-out" Date & Time: 10/25/2017; 1123 hrs.  Description of Procedure Process:   Target Area: For Lumbar Facet blocks, the target is the groove formed by the junction of the transverse process and superior articular process.  For the L5 dorsal ramus, the target is the notch between superior articular process and sacral ala. For the Sacral dorsal rami, the target is the superior and lateral edge of the posterior S1, S2, & S3 Sacral foramen. Approach: Paraspinal approach. Area Prepped: Entire Posterior Lumbosacral Region Prepping solution: Hibiclens (4.0% Chlorhexidine gluconate solution) Safety Precautions: Aspiration looking for blood return was conducted prior to all injections. At no point did we inject any substances, as a needle was being advanced. No attempts were made at seeking any paresthesias. Safe injection  practices and needle disposal techniques used. Medications properly checked for expiration dates. SDV (single dose vial) medications used. Description of the Procedure: Protocol guidelines were followed. The patient was placed in position over the procedure table. The target area was identified and the area prepped in the usual manner. The skin and muscle were infiltrated with local anesthetic. Appropriate amount of time allowed to pass for local anesthetics to take effect. Radiofrequency needles were introduced to the target area using fluoroscopic guidance. Using the NeuroTherm NT1100 Radiofrequency Generator, sensory stimulation using 50 Hz was used to locate & identify the nerve, making sure that the needle was positioned such that there was no sensory stimulation below 0.3 V or above 0.7 V. Stimulation using 2 Hz was used to evaluate the motor component. Care was taken not to lesion any nerves that demonstrated motor stimulation of the lower extremities at an output of less than 2.5 times that of the sensory threshold, or a maximum of 2.0 V. Once satisfactory placement of the needles was achieved, the numbing solution was slowly injected after negative aspiration. After waiting for at least 2 minutes, the ablation was performed at 80 degrees C for 60 seconds, using regular Radiofrequency settings. Once the  procedure was completed, the needles were then removed and the area cleansed, making sure to leave some of the prepping solution back to take advantage of its long term bactericidal properties. Intra-operative Compliance: Compliant Vitals:   10/25/17 1215 10/25/17 1227 10/25/17 1236 10/25/17 1247  BP: (!) 164/73 (!) 163/80 138/75 (!) 164/77  Pulse:      Resp: 12 13 12 16   Temp:  98.1 F (36.7 C)    TempSrc:  Tympanic    SpO2: 100% 99% 99% 99%  Weight:      Height:        Start Time: 1123 hrs. End Time: 1215 hrs. Materials & Medications:  Needle(s) Type: Teflon-coated, curved tip, Radiofrequency needle(s) Gauge: 22G Length: 10cm Medication(s): We administered midazolam, fentaNYL, lidocaine, triamcinolone acetonide, ropivacaine (PF) 2 mg/mL (0.2%), triamcinolone acetonide, and ropivacaine (PF) 2 mg/mL (0.2%). Please see chart orders for dosing details.  Imaging Guidance (Spinal):  Type of Imaging Technique: Fluoroscopy Guidance (Spinal) Indication(s): Assistance in needle guidance and placement for procedures requiring needle placement in or near specific anatomical locations not easily accessible without such assistance. Exposure Time: Please see nurses notes. Contrast: None used. Fluoroscopic Guidance: I was personally present during the use of fluoroscopy. "Tunnel Vision Technique" used to obtain the best possible view of the target area. Parallax error corrected before commencing the procedure. "Direction-depth-direction" technique used to introduce the needle under continuous pulsed fluoroscopy. Once target was reached, antero-posterior, oblique, and lateral fluoroscopic projection used confirm needle placement in all planes. Images permanently stored in EMR. Interpretation: No contrast injected. I personally interpreted the imaging intraoperatively. Adequate needle placement confirmed in multiple planes. Permanent images saved into the patient's record.  Antibiotic Prophylaxis:   Indication(s): None identified Antibiotic given: None  Post-operative Assessment:  EBL: None Complications: No immediate post-treatment complications observed by team, or reported by patient. Note: The patient tolerated the entire procedure well. A repeat set of vitals were taken after the procedure and the patient was kept under observation following institutional policy, for this type of procedure. Post-procedural neurological assessment was performed, showing return to baseline, prior to discharge. The patient was provided with post-procedure discharge instructions, including a section on how to identify potential problems. Should any problems arise concerning this procedure, the patient was given instructions  to immediately contact us, at any time, without hesitation. In any case, we plan to contact the patient by telephone for a follow-up status report regarding this interventional procedure. Comments:  No additional relevant information.  Plan of Care    Imaging Orders     DG C-Arm 1-60 Min-No Report  Procedure Orders     Radiofrequency,Lumbar     Radiofrequency Sacroiliac Joint  Medications ordered for procedure: Meds ordered this encounter  Medications  . lactated ringers infusion 1,000 mL  . midazolam (VERSED) 5 MG/5ML injection 1-2 mg    Make sure Flumazenil is available in the pyxis when using this medication. If oversedation occurs, administer 0.2 mg IV over 15 sec. If after 45 sec no response, administer 0.2 mg again over 1 min; may repeat at 1 min intervals; not to exceed 4 doses (1 mg)  . fentaNYL (SUBLIMAZE) injection 25-50 mcg    Make sure Narcan is available in the pyxis when using this medication. In the event of respiratory depression (RR< 8/min): Titrate NARCAN (naloxone) in increments of 0.1 to 0.2 mg IV at 2-3 minute intervals, until desired degree of reversal.  . lidocaine (XYLOCAINE) 2 % (with pres) injection 200 mg  . triamcinolone acetonide (KENALOG-40)  injection 40 mg  . ropivacaine (PF) 2 mg/mL (0.2%) (NAROPIN) injection 9 mL  . triamcinolone acetonide (KENALOG-40) injection 40 mg  . ropivacaine (PF) 2 mg/mL (0.2%) (NAROPIN) injection 9 mL  . oxyCODONE-acetaminophen (PERCOCET) 5-325 MG tablet    Sig: Take 1 tablet every 8 (eight) hours as needed for up to 7 days by mouth for severe pain.    Dispense:  21 tablet    Refill:  0    For acute post-operative pain. Not to be refilled. To last 7 days.   Medications administered: We administered midazolam, fentaNYL, lidocaine, triamcinolone acetonide, ropivacaine (PF) 2 mg/mL (0.2%), triamcinolone acetonide, and ropivacaine (PF) 2 mg/mL (0.2%).  See the medical record for exact dosing, route, and time of administration.  This SmartLink is deprecated. Use AVSMEDLIST instead to display the medication list for a patient. Disposition: Discharge home  Discharge Date & Time: 10/25/2017; 1254 hrs.   Physician-requested Follow-up: Return for contralateral RFA in about 2-weeks: (L) L-FCT RFA. Future Appointments  Date Time Provider [place]  11/28/2017 11:30 AM Einar Pheasant, MD [facility]  12/20/2017  2:15 PM Milinda Pointer, MD [facility] None  01/10/2018 11:15 AM Vevelyn Francois, NP [facility] None  04/09/2018  2:30 PM Grayland Ormond Kathlene November, MD [facility] None   Primary Care Physician: Einar Pheasant, MD Location: [facility] Note by: Gaspar Cola, MD Date: 10/25/2017; Time: 1:23 PM  Disclaimer:  Medicine is not an Chief Strategy Officer. The only guarantee in medicine is that nothing is guaranteed. It is important to note that the decision to proceed with this intervention was based on the information collected from the patient. The Data and conclusions were drawn from the patient's questionnaire, the interview, and the physical examination. Because the information was provided in large part by the patient, it cannot be guaranteed that it has not been  purposely or unconsciously manipulated. Every effort has been made to obtain as much relevant data as possible for this evaluation. It is important to note that the conclusions that lead to this procedure are derived in large part from the available data. Always take into account that the treatment will also be dependent on availability of resources and existing treatment guidelines, considered by other Pain Management Practitioners  as being common knowledge and practice, at the time of the intervention. For Medico-Legal purposes, it is also important to point out that variation in procedural techniques and pharmacological choices are the acceptable norm. The indications, contraindications, technique, and results of the above procedure should only be interpreted and judged by a Board-Certified Interventional Pain Specialist with extensive familiarity and expertise in the same exact procedure and technique.

## 2017-10-25 NOTE — Progress Notes (Signed)
Safety precautions to be maintained throughout the outpatient stay will include: orient to surroundings, keep bed in low position, maintain call bell within reach at all times, provide assistance with transfer out of bed and ambulation.  

## 2017-10-25 NOTE — Patient Instructions (Signed)
____________________________________________________________________________________________  Post-Procedure instructions Instructions:  Apply ice: Fill a plastic sandwich bag with crushed ice. Cover it with a small towel and apply to injection site. Apply for 15 minutes then remove x 15 minutes. Repeat sequence on day of procedure, until you go to bed. The purpose is to minimize swelling and discomfort after procedure.  Apply heat: Apply heat to procedure site starting the day following the procedure. The purpose is to treat any soreness and discomfort from the procedure.  Food intake: Start with clear liquids (like water) and advance to regular food, as tolerated.   Physical activities: Keep activities to a minimum for the first 8 hours after the procedure.   Driving: If you have received any sedation, you are not allowed to drive for 24 hours after your procedure.  Blood thinner: Restart your blood thinner 6 hours after your procedure. (Only for those taking blood thinners)  Insulin: As soon as you can eat, you may resume your normal dosing schedule. (Only for those taking insulin)  Infection prevention: Keep procedure site clean and dry.  Post-procedure Pain Diary: Extremely important that this be done correctly and accurately. Recorded information will be used to determine the next step in treatment.  Pain evaluated is that of treated area only. Do not include pain from an untreated area.  Complete every hour, on the hour, for the initial 8 hours. Set an alarm to help you do this part accurately.  Do not go to sleep and have it completed later. It will not be accurate.  Follow-up appointment: Keep your follow-up appointment after the procedure. Usually 2 weeks for most procedures. (6 weeks in the case of radiofrequency.) Bring you pain diary.  Expect:  From numbing medicine (AKA: Local Anesthetics): Numbness or decrease in pain.  Onset: Full effect within 15 minutes of  injected.  Duration: It will depend on the type of local anesthetic used. On the average, 1 to 8 hours.   From steroids: Decrease in swelling or inflammation. Once inflammation is improved, relief of the pain will follow.  Onset of benefits: Depends on the amount of swelling present. The more swelling, the longer it will take for the benefits to be seen. In some cases, up to 10 days.  Duration: Steroids will stay in the system x 2 weeks. Duration of benefits will depend on multiple posibilities including persistent irritating factors.  From procedure: Some discomfort is to be expected once the numbing medicine wears off. This should be minimal if ice and heat are applied as instructed. Call if:  You experience numbness and weakness that gets worse with time, as opposed to wearing off.  New onset bowel or bladder incontinence. (Spinal procedures only)  Emergency Numbers:  Durning business hours (Monday - Thursday, 8:00 AM - 4:00 PM) (Friday, 9:00 AM - 12:00 Noon): [phone]  After hours: [phone] ____________________________________________________________________________________________  ____________________________________________________________________________________________  Preparing for Procedure with Sedation Instructions: . Oral Intake: Do not eat or drink anything for at least 8 hours prior to your procedure. . Transportation: Public transportation is not allowed. Bring an adult driver. The driver must be physically present in our waiting room before any procedure can be started. . Physical Assistance: Bring an adult physically capable of assisting you, in the event you need help. This adult should keep you company at home for at least 6 hours after the procedure. . Blood Pressure Medicine: Take your blood pressure medicine with a sip of water the morning of the procedure. . Blood   thinners:  . Diabetics on insulin: Notify the staff so that you can be scheduled  1st case in the morning. If your diabetes requires high dose insulin, take only  of your normal insulin dose the morning of the procedure and notify the staff that you have done so. . Preventing infections: Shower with an antibacterial soap the morning of your procedure. . Build-up your immune system: Take 1000 mg of Vitamin C with every meal (3 times a day) the day prior to your procedure. . Antibiotics: Inform the staff if you have a condition or reason that requires you to take antibiotics before dental procedures. . Pregnancy: If you are pregnant, call and cancel the procedure. . Sickness: If you have a cold, fever, or any active infections, call and cancel the procedure. . Arrival: You must be in the facility at least 30 minutes prior to your scheduled procedure. . Children: Do not bring children with you. . Dress appropriately: Bring dark clothing that you would not mind if they get stained. . Valuables: Do not bring any jewelry or valuables. Procedure appointments are reserved for interventional treatments only. . No Prescription Refills. . No medication changes will be discussed during procedure appointments. . No disability issues will be discussed. ____________________________________________________________________________________________   

## 2017-10-26 ENCOUNTER — Ambulatory Visit: Payer: Medicare Other

## 2017-10-26 ENCOUNTER — Other Ambulatory Visit: Payer: Self-pay | Admitting: Oncology

## 2017-10-26 ENCOUNTER — Other Ambulatory Visit: Payer: Self-pay | Admitting: Internal Medicine

## 2017-10-26 ENCOUNTER — Telehealth: Payer: Self-pay

## 2017-10-26 NOTE — Telephone Encounter (Signed)
Post procedure phone call. Patient states she is doing good.  

## 2017-11-13 DIAGNOSIS — M545 Low back pain: Secondary | ICD-10-CM | POA: Diagnosis not present

## 2017-11-16 DIAGNOSIS — Z23 Encounter for immunization: Secondary | ICD-10-CM | POA: Diagnosis not present

## 2017-11-28 ENCOUNTER — Ambulatory Visit (INDEPENDENT_AMBULATORY_CARE_PROVIDER_SITE_OTHER): Payer: Medicare Other | Admitting: Internal Medicine

## 2017-11-28 ENCOUNTER — Encounter: Payer: Self-pay | Admitting: Internal Medicine

## 2017-11-28 DIAGNOSIS — M8589 Other specified disorders of bone density and structure, multiple sites: Secondary | ICD-10-CM

## 2017-11-28 DIAGNOSIS — J329 Chronic sinusitis, unspecified: Secondary | ICD-10-CM | POA: Diagnosis not present

## 2017-11-28 DIAGNOSIS — I10 Essential (primary) hypertension: Secondary | ICD-10-CM

## 2017-11-28 DIAGNOSIS — F329 Major depressive disorder, single episode, unspecified: Secondary | ICD-10-CM | POA: Diagnosis not present

## 2017-11-28 DIAGNOSIS — C50211 Malignant neoplasm of upper-inner quadrant of right female breast: Secondary | ICD-10-CM

## 2017-11-28 DIAGNOSIS — E7800 Pure hypercholesterolemia, unspecified: Secondary | ICD-10-CM

## 2017-11-28 DIAGNOSIS — G20 Parkinson's disease: Secondary | ICD-10-CM | POA: Diagnosis not present

## 2017-11-28 DIAGNOSIS — F32A Depression, unspecified: Secondary | ICD-10-CM

## 2017-11-28 MED ORDER — AMOXICILLIN 875 MG PO TABS: 875.0000 mg | ORAL_TABLET | Freq: Two times a day (BID) | ORAL | 0 refills | Status: DC

## 2017-11-28 NOTE — Patient Instructions (Signed)
Saline nasal spray - flush nose at least 2-3x/day  nasacort (or flonase) nasal spray - 2 sprays each nostril one time per day.  Do this in the evening.    mucinex in the am and robitussin in the evening if needed for increased congestion.    Take a probiotic daily while you are on the antibiotics and for two weeks after completing the antibiotics.

## 2017-11-28 NOTE — Progress Notes (Signed)
Patient ID: Wanda Martinez, female   DOB: 06-11-1935, 82 y.o.   MRN: [id number]   Subjective:    Patient ID: Wanda Martinez, female    DOB: Jan 30, 1935, 82 y.o.   MRN: [id number]  HPI  Patient here for a scheduled follow up.  She reports having increased sinus pressure and congestion.  Right ear feels full.  Increased post nasal drainage.  Increased thick mucus and cough.  No chest congestion.  Reports that a few days ago, was dizzy.  Has a history of inner ear.  Felt similar to her flares previously.  Usually takes antivert.  Had an episode of emesis with the increased dizziness.  No further emesis.  Is eating.  Reported blood tinged mucus production (nasal).  No chest pain.  No sob.  No bowel change.  Discussed bone density.  On letrozole.  Discussed treatment for her bones.  Has GI issues.  Discussed prolia.  She is in agreement.     Past Medical History:  Diagnosis Date  . Anemia   . Arm pain 07/26/2015  . Arthritis   . Arthritis, degenerative 03/26/2014  . Back pain 11/01/2013  . Breast cancer ([place])    Masectomy - left - 1986   . Breast cancer [facility])    Mastectomy-right -2014  . CHEST PAIN 04/29/2010   Qualifier: Diagnosis of  By: Wynetta Emery RN, Doroteo Bradford    . Chronic cystitis   . Cystocele 02/22/2013  . Cystocele, midline 08/19/2013  . Degeneration of intervertebral disc of lumbosacral region 03/26/2014  . DYSPNEA 04/29/2010   Qualifier: Diagnosis of  By: Wynetta Emery RN, Doroteo Bradford    . Enthesopathy of hip 03/26/2014  . GERD (gastroesophageal reflux disease)   . Hiatal hernia   . HTN (hypertension)   . LBP (low back pain) 03/26/2014  . Neck pain 11/01/2013  . Parkinson disease ([place])   . Sinusitis 02/07/2015  . Skin lesions 07/12/2014  . Urinary incontinence    mixed    Past Surgical History:  Procedure Laterality Date  . ABDOMINAL HYSTERECTOMY    . BREAST BIOPSY  2013  . BREAST ENHANCEMENT SURGERY  1987  . BREAST IMPLANT REMOVAL    . BREAST IMPLANT REMOVAL Right 08/29/2012  . BREAST  SURGERY  1986   s/p left mastectomy  . COLONOSCOPY WITH PROPOFOL N/A 09/13/2016   Procedure: COLONOSCOPY WITH PROPOFOL;  Surgeon: Manya Silvas, MD;  Location: [facility]COPY;  Service: Endoscopy;  Laterality: N/A;  . MASTECTOMY  08/2012   right  . TONSILLECTOMY AND ADENOIDECTOMY  79  . VESICOVAGINAL FISTULA CLOSURE W/ TAH  1983   Family History  Problem Relation Age of Onset  . Diabetes Father   . Stroke Father   . Colon polyps Father   . Stroke Mother   . Parkinson's disease Mother    Social History   Socioeconomic History  . Marital status: Married    Spouse name: None  . Number of children: 3  . Years of education: None  . Highest education level: None  Social Needs  . Financial resource strain: None  . Food insecurity - worry: None  . Food insecurity - inability: None  . Transportation needs - medical: None  . Transportation needs - non-medical: None  Occupational History  . None  Tobacco Use  . Smoking status: Never Smoker  . Smokeless tobacco: Never Used  . Tobacco comment: tobacco use - no  Substance and Sexual Activity  . Alcohol use: No    Alcohol/week: 0.0  oz  . Drug use: No  . Sexual activity: No  Other Topics Concern  . None  Social History Narrative  . None    Outpatient Encounter Medications as of 11/28/2017  Medication Sig  . amLODipine (NORVASC) 2.5 MG tablet TAKE 1 TABLET BY MOUTH ONCE DAILY.  Marland Kitchen aspirin 81 MG EC tablet Take 81 mg by mouth daily as needed.    . Calcium Carbonate (CALCIUM 600) 1500 MG TABS Take 1 tablet by mouth daily.    . carbidopa-levodopa (SINEMET IR) 25-100 MG tablet Take by mouth.  . citalopram (CELEXA) 20 MG tablet TAKE 1 & 1/2 TABLETS BY MOUTH ONCE DAILY  . cyclobenzaprine (FLEXERIL) 10 MG tablet Take 1 tablet (10 mg total) by mouth at bedtime.  . gabapentin (NEURONTIN) 300 MG capsule Take 1 capsule (300 mg total) by mouth 2 (two) times daily.  Marland Kitchen GLUCOSAMINE SULFATE PO Take by mouth daily.   . hydrochlorothiazide  (HYDRODIURIL) 25 MG tablet TAKE 1 TABLET BY MOUTH ONCE DAILY. (PATIENT TAKE 1/2 ONCE DAILY AND EXTRA 1/2 IF NEEDED)  . HYDROcodone-acetaminophen (NORCO/VICODIN) 5-325 MG tablet Take 1 tablet by mouth every 8 (eight) hours as needed for severe pain.  Derrill Memo ON 12/25/2017] HYDROcodone-acetaminophen (NORCO/VICODIN) 5-325 MG tablet Take 1 tablet by mouth every 8 (eight) hours as needed for severe pain.  Marland Kitchen letrozole (FEMARA) 2.5 MG tablet TAKE 1 TABLET BY MOUTH ONCE DAILY.  Marland Kitchen losartan (COZAAR) 100 MG tablet TAKE 1 TABLET BY MOUTH ONCE DAILY.  Marland Kitchen lubiprostone (AMITIZA) 24 MCG capsule TAKE 1 CAPSULE BY MOUTH ONCE DAILY WITH BREAKFAST.  . magnesium oxide (MAGNESIUM-OXIDE) 400 (241.3 Mg) MG tablet Take 1 tablet (400 mg total) by mouth daily.  . meloxicam (MOBIC) 7.5 MG tablet Take 1 tablet (7.5 mg total) by mouth daily.  . metoprolol succinate (TOPROL-XL) 25 MG 24 hr tablet TAKE 1 TABLET BY MOUTH TWICE DAILY  . Multiple Vitamin (MULTIVITAMIN) tablet Take 1 tablet by mouth daily.  . Omega-3 1000 MG CAPS Take by mouth daily.   Marland Kitchen omeprazole (PRILOSEC) 20 MG capsule TAKE 1 CAPSULE BY MOUTH TWICE DAILY FOR REFLUX.  . pantoprazole (PROTONIX) 40 MG tablet TAKE 1 TABLET BY MOUTH ONCE DAILY.  . pravastatin (PRAVACHOL) 20 MG tablet TAKE 1 TABLET BY MOUTH ONCE DAILY.  Marland Kitchen amoxicillin (AMOXIL) 875 MG tablet Take 1 tablet (875 mg total) by mouth 2 (two) times daily.  Marland Kitchen HYDROcodone-acetaminophen (NORCO/VICODIN) 5-325 MG tablet Take 1 tablet by mouth every 8 (eight) hours as needed for severe pain.   No facility-administered encounter medications on file as of 11/28/2017.     Review of Systems  Constitutional: Negative for appetite change and unexpected weight change.  HENT: Positive for congestion, postnasal drip and sinus pressure.   Respiratory: Positive for cough. Negative for chest tightness and shortness of breath.   Cardiovascular: Negative for chest pain, palpitations and leg swelling.  Gastrointestinal:  Negative for diarrhea.       No vomiting now.  No increased abdominal pain.    Genitourinary: Negative for difficulty urinating and dysuria.  Musculoskeletal: Negative for joint swelling and myalgias.  Skin: Negative for color change and rash.  Neurological: Positive for dizziness. Negative for headaches.  Psychiatric/Behavioral: Negative for agitation and dysphoric mood.       Objective:    Physical Exam  Constitutional: She appears well-developed and well-nourished. No distress.  HENT:  Mouth/Throat: Oropharynx is clear and moist.  Nares - Slightly erythematous turbinates.  Minimal tenderness to palpation over the sinuses.  TMs without erythema.    Neck: Neck supple. No thyromegaly present.  Cardiovascular: Normal rate and regular rhythm.  Pulmonary/Chest: Breath sounds normal. No respiratory distress. She has no wheezes.  Abdominal: Soft. Bowel sounds are normal. There is no tenderness.  Musculoskeletal: She exhibits no edema or tenderness.  Lymphadenopathy:    She has no cervical adenopathy.  Skin: No rash noted. No erythema.  Psychiatric: She has a normal mood and affect. Her behavior is normal.    BP 100/72 (BP Location: Right Arm, Patient Position: Sitting, Cuff Size: Normal)   Pulse 63   Temp 98.3 F (36.8 C) (Oral)   Wt 177 lb (80.3 kg)   LMP 12/18/1981   BMI 30.38 kg/m  Wt Readings from Last 3 Encounters:  11/28/17 177 lb (80.3 kg)  10/25/17 172 lb (78 kg)  10/09/17 174 lb (78.9 kg)     Lab Results  Component Value Date   WBC 5.5 09/20/2017   HGB 12.0 09/20/2017   HCT 35.8 (L) 09/20/2017   PLT 293.0 09/20/2017   GLUCOSE 90 10/16/2017   CHOL 140 09/20/2017   TRIG 135.0 09/20/2017   HDL 41.30 09/20/2017   LDLDIRECT 100.0 10/18/2016   LDLCALC 72 09/20/2017   ALT 18 09/20/2017   AST 18 09/20/2017   NA 130 (L) 10/16/2017   K 3.5 10/16/2017   CL 96 (L) 10/16/2017   CREATININE 0.91 10/16/2017   BUN 20 10/16/2017   CO2 25 10/16/2017   TSH 3.16  05/03/2017       Assessment & Plan:   Problem List Items Addressed This Visit    Clinical depression    On citalopram.  Stable.        Essential hypertension    Blood pressure under good control.  Continue same medication regimen.  Follow pressures.  Follow metabolic panel.        Hypercholesterolemia    On pravastatin.  Low cholesterol diet and exercise.  Follow lipid panel and liver function tests.        Osteopenia    Discussed recent bone density.  T score - -2.3.  Discussed treatment.  On letrozole (breast cancer).  Discussed prolia.  Will see if can get insurance authorization.        Parkinsons ([place])    Followed by neurology.  Stable.       Primary cancer of upper inner quadrant of right female breast [facility])    S/p bilateral mastectomy.  On letrozole.  Followed by Dr Grayland Ormond.        Relevant Medications   amoxicillin (AMOXIL) 875 MG tablet   Sinusitis    Symptoms as outlined.  Appears to be c/w sinus infection.  Treat with amoxicillin.  Take probiotic as directed.  Saline nasal spray and steroid nasal spray as directed.  Mucinex/robitussin.  Has a history of inner ear.  Dizziness recently c/w inner ear.  Better now.  Refill antivert.  Treat infection.  Notify me if symptoms persist.        Relevant Medications   amoxicillin (AMOXIL) 875 MG tablet       Einar Pheasant, MD

## 2017-11-30 ENCOUNTER — Other Ambulatory Visit: Payer: Self-pay | Admitting: Internal Medicine

## 2017-11-30 ENCOUNTER — Telehealth: Payer: Self-pay | Admitting: Internal Medicine

## 2017-11-30 MED ORDER — MECLIZINE HCL 12.5 MG PO TABS: 12.5000 mg | ORAL_TABLET | Freq: Two times a day (BID) | ORAL | 0 refills | Status: DC | PRN

## 2017-11-30 NOTE — Telephone Encounter (Signed)
Copied from [place]. Topic: Quick Communication - Rx Refill/Question >> Nov 30, 2017 11:58 AM Patrice Paradise wrote: Has the patient contacted their pharmacy? Yes.   Meclizine (PATIENT WAS IN THE OFFICE TO SEE Dr. Nicki Reaper ON 11/28/17, AND SAID THAT SHE WAS SUPPOSE TO SEND THE RX TO HER PHARMACY).  (Agent: If no, request that the patient contact the pharmacy for the refill.)  Preferred Pharmacy (with phone number or street name):  [place], [place] - [address] [place] [address] Phone: [phone] Fax: [phone]   Agent: Please be advised that RX refills may take up to 3 business days. We ask that you follow-up with your pharmacy.

## 2017-11-30 NOTE — Telephone Encounter (Signed)
Sent in rx for meclizine.

## 2017-11-30 NOTE — Telephone Encounter (Signed)
Patient was in for office visit on 11/28/17 and says she was supposed to have meclizine sent to pharmacy. Ok to send in?

## 2017-11-30 NOTE — Telephone Encounter (Signed)
I don't see where she is on this medication.

## 2017-11-30 NOTE — Progress Notes (Signed)
rx sent in for meclizine #20 with no refills.

## 2017-11-30 NOTE — Telephone Encounter (Signed)
Called patient to let her know, unable to leave v/m

## 2017-12-01 ENCOUNTER — Encounter: Payer: Self-pay | Admitting: Internal Medicine

## 2017-12-01 NOTE — Assessment & Plan Note (Signed)
On pravastatin.  Low cholesterol diet and exercise.  Follow lipid panel and liver function tests.   

## 2017-12-01 NOTE — Assessment & Plan Note (Signed)
S/p bilateral mastectomy.  On letrozole.  Followed by Dr Grayland Ormond.

## 2017-12-01 NOTE — Assessment & Plan Note (Signed)
On citalopram.  Stable.   

## 2017-12-01 NOTE — Assessment & Plan Note (Signed)
Followed by neurology.  Stable  

## 2017-12-01 NOTE — Assessment & Plan Note (Signed)
Discussed recent bone density.  T score - -2.3.  Discussed treatment.  On letrozole (breast cancer).  Discussed prolia.  Will see if can get insurance authorization.

## 2017-12-01 NOTE — Assessment & Plan Note (Signed)
Blood pressure under good control.  Continue same medication regimen.  Follow pressures.  Follow metabolic panel.   

## 2017-12-01 NOTE — Assessment & Plan Note (Signed)
Symptoms as outlined.  Appears to be c/w sinus infection.  Treat with amoxicillin.  Take probiotic as directed.  Saline nasal spray and steroid nasal spray as directed.  Mucinex/robitussin.  Has a history of inner ear.  Dizziness recently c/w inner ear.  Better now.  Refill antivert.  Treat infection.  Notify me if symptoms persist.

## 2017-12-20 ENCOUNTER — Ambulatory Visit: Payer: Medicare Other | Admitting: Pain Medicine

## 2017-12-25 ENCOUNTER — Other Ambulatory Visit: Payer: Self-pay | Admitting: Oncology

## 2017-12-25 ENCOUNTER — Other Ambulatory Visit: Payer: Self-pay | Admitting: Pain Medicine

## 2017-12-25 ENCOUNTER — Other Ambulatory Visit: Payer: Self-pay | Admitting: Internal Medicine

## 2017-12-25 ENCOUNTER — Other Ambulatory Visit: Payer: Self-pay | Admitting: Nurse Practitioner

## 2017-12-25 DIAGNOSIS — M150 Primary generalized (osteo)arthritis: Secondary | ICD-10-CM

## 2017-12-25 DIAGNOSIS — M7918 Myalgia, other site: Secondary | ICD-10-CM

## 2017-12-25 DIAGNOSIS — M159 Polyosteoarthritis, unspecified: Secondary | ICD-10-CM

## 2017-12-25 DIAGNOSIS — M792 Neuralgia and neuritis, unspecified: Secondary | ICD-10-CM

## 2018-01-02 ENCOUNTER — Encounter: Payer: Self-pay | Admitting: Nurse Practitioner

## 2018-01-02 ENCOUNTER — Ambulatory Visit: Payer: Medicare Other | Attending: Nurse Practitioner | Admitting: Nurse Practitioner

## 2018-01-02 ENCOUNTER — Other Ambulatory Visit: Payer: Self-pay

## 2018-01-02 VITALS — BP 148/94 | HR 68 | Temp 98.6°F | Resp 16 | Ht 64.0 in | Wt 180.0 lb

## 2018-01-02 DIAGNOSIS — M48062 Spinal stenosis, lumbar region with neurogenic claudication: Secondary | ICD-10-CM | POA: Insufficient documentation

## 2018-01-02 DIAGNOSIS — M7062 Trochanteric bursitis, left hip: Secondary | ICD-10-CM | POA: Insufficient documentation

## 2018-01-02 DIAGNOSIS — M4807 Spinal stenosis, lumbosacral region: Secondary | ICD-10-CM | POA: Diagnosis not present

## 2018-01-02 DIAGNOSIS — Z8371 Family history of colonic polyps: Secondary | ICD-10-CM | POA: Insufficient documentation

## 2018-01-02 DIAGNOSIS — M8580 Other specified disorders of bone density and structure, unspecified site: Secondary | ICD-10-CM | POA: Insufficient documentation

## 2018-01-02 DIAGNOSIS — M150 Primary generalized (osteo)arthritis: Secondary | ICD-10-CM | POA: Diagnosis not present

## 2018-01-02 DIAGNOSIS — I129 Hypertensive chronic kidney disease with stage 1 through stage 4 chronic kidney disease, or unspecified chronic kidney disease: Secondary | ICD-10-CM | POA: Diagnosis not present

## 2018-01-02 DIAGNOSIS — E669 Obesity, unspecified: Secondary | ICD-10-CM | POA: Insufficient documentation

## 2018-01-02 DIAGNOSIS — D649 Anemia, unspecified: Secondary | ICD-10-CM | POA: Insufficient documentation

## 2018-01-02 DIAGNOSIS — G894 Chronic pain syndrome: Secondary | ICD-10-CM | POA: Diagnosis not present

## 2018-01-02 DIAGNOSIS — E7800 Pure hypercholesterolemia, unspecified: Secondary | ICD-10-CM | POA: Insufficient documentation

## 2018-01-02 DIAGNOSIS — M25551 Pain in right hip: Secondary | ICD-10-CM

## 2018-01-02 DIAGNOSIS — Z87440 Personal history of urinary (tract) infections: Secondary | ICD-10-CM | POA: Insufficient documentation

## 2018-01-02 DIAGNOSIS — M7918 Myalgia, other site: Secondary | ICD-10-CM | POA: Insufficient documentation

## 2018-01-02 DIAGNOSIS — R079 Chest pain, unspecified: Secondary | ICD-10-CM | POA: Insufficient documentation

## 2018-01-02 DIAGNOSIS — G8918 Other acute postprocedural pain: Secondary | ICD-10-CM | POA: Insufficient documentation

## 2018-01-02 DIAGNOSIS — K5909 Other constipation: Secondary | ICD-10-CM | POA: Insufficient documentation

## 2018-01-02 DIAGNOSIS — F329 Major depressive disorder, single episode, unspecified: Secondary | ICD-10-CM | POA: Diagnosis not present

## 2018-01-02 DIAGNOSIS — M47816 Spondylosis without myelopathy or radiculopathy, lumbar region: Secondary | ICD-10-CM | POA: Insufficient documentation

## 2018-01-02 DIAGNOSIS — Z7982 Long term (current) use of aspirin: Secondary | ICD-10-CM | POA: Insufficient documentation

## 2018-01-02 DIAGNOSIS — M79604 Pain in right leg: Secondary | ICD-10-CM | POA: Insufficient documentation

## 2018-01-02 DIAGNOSIS — M545 Low back pain, unspecified: Secondary | ICD-10-CM

## 2018-01-02 DIAGNOSIS — M7061 Trochanteric bursitis, right hip: Secondary | ICD-10-CM | POA: Diagnosis not present

## 2018-01-02 DIAGNOSIS — Z79891 Long term (current) use of opiate analgesic: Secondary | ICD-10-CM | POA: Insufficient documentation

## 2018-01-02 DIAGNOSIS — G20 Parkinson's disease: Secondary | ICD-10-CM | POA: Diagnosis not present

## 2018-01-02 DIAGNOSIS — N189 Chronic kidney disease, unspecified: Secondary | ICD-10-CM | POA: Insufficient documentation

## 2018-01-02 DIAGNOSIS — M79601 Pain in right arm: Secondary | ICD-10-CM | POA: Insufficient documentation

## 2018-01-02 DIAGNOSIS — M5412 Radiculopathy, cervical region: Secondary | ICD-10-CM | POA: Diagnosis not present

## 2018-01-02 DIAGNOSIS — M1611 Unilateral primary osteoarthritis, right hip: Secondary | ICD-10-CM | POA: Insufficient documentation

## 2018-01-02 DIAGNOSIS — M25511 Pain in right shoulder: Secondary | ICD-10-CM | POA: Insufficient documentation

## 2018-01-02 DIAGNOSIS — M792 Neuralgia and neuritis, unspecified: Secondary | ICD-10-CM | POA: Diagnosis not present

## 2018-01-02 DIAGNOSIS — M479 Spondylosis, unspecified: Secondary | ICD-10-CM | POA: Diagnosis not present

## 2018-01-02 DIAGNOSIS — G8929 Other chronic pain: Secondary | ICD-10-CM | POA: Diagnosis not present

## 2018-01-02 DIAGNOSIS — R32 Unspecified urinary incontinence: Secondary | ICD-10-CM | POA: Insufficient documentation

## 2018-01-02 DIAGNOSIS — M5137 Other intervertebral disc degeneration, lumbosacral region: Secondary | ICD-10-CM | POA: Insufficient documentation

## 2018-01-02 DIAGNOSIS — Z79899 Other long term (current) drug therapy: Secondary | ICD-10-CM | POA: Insufficient documentation

## 2018-01-02 DIAGNOSIS — M159 Polyosteoarthritis, unspecified: Secondary | ICD-10-CM

## 2018-01-02 DIAGNOSIS — C50211 Malignant neoplasm of upper-inner quadrant of right female breast: Secondary | ICD-10-CM | POA: Diagnosis not present

## 2018-01-02 DIAGNOSIS — K449 Diaphragmatic hernia without obstruction or gangrene: Secondary | ICD-10-CM | POA: Insufficient documentation

## 2018-01-02 DIAGNOSIS — K219 Gastro-esophageal reflux disease without esophagitis: Secondary | ICD-10-CM | POA: Insufficient documentation

## 2018-01-02 MED ORDER — HYDROCODONE-ACETAMINOPHEN 5-325 MG PO TABS: 1.0000 | ORAL_TABLET | Freq: Three times a day (TID) | ORAL | 0 refills | Status: DC | PRN

## 2018-01-02 MED ORDER — CYCLOBENZAPRINE HCL 10 MG PO TABS: 10.0000 mg | ORAL_TABLET | Freq: Every day | ORAL | 0 refills | Status: DC

## 2018-01-02 MED ORDER — MAGNESIUM OXIDE 400 (241.3 MG) MG PO TABS: 1.0000 | ORAL_TABLET | Freq: Every day | ORAL | 0 refills | Status: DC

## 2018-01-02 MED ORDER — GABAPENTIN 300 MG PO CAPS: 300.0000 mg | ORAL_CAPSULE | Freq: Two times a day (BID) | ORAL | 0 refills | Status: DC

## 2018-01-02 MED ORDER — MELOXICAM 7.5 MG PO TABS: 7.5000 mg | ORAL_TABLET | Freq: Every day | ORAL | 0 refills | Status: DC

## 2018-01-02 NOTE — Progress Notes (Signed)
Patient's Name: Wanda Martinez  MRN: [id number]  Referring Provider: Einar Pheasant, MD  DOB: 04-10-1935  PCP: Einar Pheasant, MD  DOS: 01/02/2018  Note by: Vevelyn Francois NP  Service setting: Ambulatory outpatient  Specialty: Interventional Pain Management  Location: [facility] (AMB) Pain Management Facility    Patient type: Established    Primary Reason(s) for Visit: Encounter for prescription drug management & post-procedure evaluation of chronic illness with mild to moderate exacerbation(Level of risk: moderate) CC: Back Pain (low, right side); Hip Pain (right); and Leg Pain (right leg down to foot to 2 little toes)  HPI  Wanda Martinez is a 83 y.o. year old, female patient, who comes today for a post-procedure evaluation and medication management. She has Hypercholesterolemia; Essential hypertension; Frequent UTI; Primary cancer of upper inner quadrant of right female breast ([place]); Tremor; Toenail fungus; Headache; Obesity (BMI 30-39.9); Dysuria; Sinusitis; Parkinsons ([place]); Fatigue; Chronic kidney disease (CKD), stage II (mild); Clinical depression; Incomplete bladder emptying; Mixed incontinence; HLD (hyperlipidemia); Bladder infection, chronic; Parkinson's disease ([place]); Pure hypercholesterolemia; Hernia, rectovaginal; Long term current use of opiate analgesic; Long term prescription opiate use; Opiate use; Encounter for therapeutic drug level monitoring; Encounter for chronic pain management; Chronic low back pain (Primary Source of Pain) (Bilateral) (R>L); Lumbar spondylosis; Chronic hip pain; Chronic neck pain; Cervical spondylosis; Chronic cervical radicular pain (Right); Chronic upper extremity pain (Right); Osteoarthritis; Diffuse myofascial pain syndrome; Neurogenic pain; Chronic upper back pain (Right); Myofascial pain syndrome (Right) (cervicothoracic); Vaginitis; Lumbar facet syndrome (Bilateral) (R>L); Malignant neoplasm of right female breast ([place]); Chronic constipation; Cervical facet  hypertrophy; Cervical facet syndrome ([place]); Chronic shoulder pain (Right); Chronic pain syndrome; Chronic sacroiliac joint pain (Left); Chronic sacroiliac joint pain (Right); Lumbosacral foraminal stenosis (L5-S1) (Right); Lumbar spinal stenosis (with neurogenic claudication) (L3-4); Chronic lower extremity pain (Secondary Area of Pain) (Right); Chronic lumbar radicular pain (S1) (Right); Osteopenia; Acute postoperative pain; and Trochanteric bursitis of both hips on their problem list. Her primarily concern today is the Back Pain (low, right side); Hip Pain (right); and Leg Pain (right leg down to foot to 2 little toes)  Pain Assessment: Location: Right Back Radiating: to right hip, down the outside of right leg to 2 small toes Onset: More than a month ago Duration: Chronic pain Quality: Aching, Constant, Sore Severity: 6 /10 (self-reported pain score)  Note: Reported level is compatible with observation. Clinically the patient looks like a 3/10 A 3/10 is viewed as "Moderate" and described as significantly interfering with activities of daily living (ADL). It becomes difficult to feed, bathe, get dressed, get on and off the toilet or to perform personal hygiene functions. Difficult to get in and out of bed or a chair without assistance. Very distracting. With effort, it can be ignored when deeply involved in activities. Information on the proper use of the pain scale provided to the patient today. When using our objective Pain Scale, levels between 6 and 10/10 are said to belong in an emergency room, as it progressively worsens from a 6/10, described as severely limiting, requiring emergency care not usually available at an outpatient pain management facility. At a 6/10 level, communication becomes difficult and requires great effort. Assistance to reach the emergency department may be required. Facial flushing and profuse sweating along with potentially dangerous increases in heart rate and blood pressure  will be evident. Timing: Constant  [name] was last seen on 12/25/2017 for a procedure. During today's appointment we reviewed Wanda Martinez's post-procedure results, as well as her outpatient  medication regimen. She admits that she has been doing   Further details on both, my assessment(s), as well as the proposed treatment plan, please see below.  Controlled Substance Pharmacotherapy Assessment REMS (Risk Evaluation and Mitigation Strategy)  Analgesic:Hydrocodone/APAP 5/325 one tablet every 8 hours (15 mg/day) MME/day:15 mg/day.   Rise Patience  01/02/2018 11:00 AM  Sign at close encounter Nursing Pain Medication Assessment:  Safety precautions to be maintained throughout the outpatient stay will include: orient to surroundings, keep bed in low position, maintain call bell within reach at all times, provide assistance with transfer out of bed and ambulation.  Medication Inspection Compliance: Pill count conducted under aseptic conditions, in front of the patient. Neither the pills nor the bottle was removed from the patient's sight at any time. Once count was completed pills were immediately returned to the patient in their original bottle.  Medication: Hydrocodone/APAP Pill/Patch Count: 89 of 90 pills remain Pill/Patch Appearance: Markings consistent with prescribed medication Bottle Appearance: Standard pharmacy container. Clearly labeled. Filled Date: 01 / 15 / 2019 Last Medication intake:  Today   Pharmacokinetics: Liberation and absorption (onset of action): WNL Distribution (time to peak effect): WNL Metabolism and excretion (duration of action): WNL         Pharmacodynamics: Desired effects: Analgesia: Wanda Martinez reports >50% benefit. Functional ability: Patient reports that medication allows her to accomplish basic ADLs Clinically meaningful improvement in function (CMIF): Sustained CMIF goals met Perceived effectiveness: Described as relatively effective, allowing for  increase in activities of daily living (ADL) Undesirable effects: Side-effects or Adverse reactions: None reported Monitoring: [place] PMP: Online review of the past 56-monthperiod conducted. Compliant with practice rules and regulations Last UDS on record: Summary  Date Value Ref Range Status  07/02/2017 FINAL  Final    Comment:    ==================================================================== TOXASSURE SELECT 13 (MW) ==================================================================== Test                             Result       Flag       Units Drug Present and Declared for Prescription Verification   Hydrocodone                    597          EXPECTED   ng/mg creat   Dihydrocodeine                 245          EXPECTED   ng/mg creat   Norhydrocodone                 1486         EXPECTED   ng/mg creat    Sources of hydrocodone include scheduled prescription    medications. Dihydrocodeine and norhydrocodone are expected    metabolites of hydrocodone. Dihydrocodeine is also available as a    scheduled prescription medication. ==================================================================== Test                      Result    Flag   Units      Ref Range   Creatinine              119              mg/dL      >=20 ==================================================================== Declared Medications:  The flagging and interpretation on this report are based on the  following  declared medications.  Unexpected results may arise from  inaccuracies in the declared medications.  **Note: The testing scope of this panel includes these medications:  Hydrocodone (Hydrocodone-Acetaminophen)  **Note: The testing scope of this panel does not include following  reported medications:  Acetaminophen (Hydrocodone-Acetaminophen)  Amlodipine Besylate  Aspirin  Calcium Carbonate  Carbidopa (Carbidopa/Levodopa)  Citalopram  Cyclobenzaprine  Gabapentin  Glucosamine   Hydrochlorothiazide  Letrozole  Levodopa (Carbidopa/Levodopa)  Losartan (Losartan Potassium)  Lubiprostone  Magnesium Oxide  Meloxicam  Metoprolol  Multivitamin  Omega-3 Fatty Acids  Omeprazole  Pravastatin ==================================================================== For clinical consultation, please call [phone]. ====================================================================    UDS interpretation: Compliant          Medication Assessment Form: Reviewed. Patient indicates being compliant with therapy Treatment compliance: Compliant Risk Assessment Profile: Aberrant behavior: See prior evaluations. None observed or detected today Comorbid factors increasing risk of overdose: See prior notes. No additional risks detected today Risk of substance use disorder (SUD): Low Opioid Risk Tool - 01/02/18 1047      Family History of Substance Abuse   Alcohol  Negative    Illegal Drugs  Negative    Rx Drugs  Negative      Personal History of Substance Abuse   Alcohol  Negative    Illegal Drugs  Negative    Rx Drugs  Negative      Age   Age between 67-45 years   No      Psychological Disease   Psychological Disease  Negative    Depression  Negative      Total Score   Opioid Risk Tool Scoring  0    Opioid Risk Interpretation  Low Risk      ORT Scoring interpretation table:  Score <3 = Low Risk for SUD  Score between 4-7 = Moderate Risk for SUD  Score >8 = High Risk for Opioid Abuse   Risk Mitigation Strategies:  Patient Counseling: Covered Patient-Prescriber Agreement (PPA): Present and active  Notification to other healthcare providers: Done  Pharmacologic Plan: No change in therapy, at this time.             Post-Procedure Assessment  10/25/2018 Procedure: Right-sided lumbar RFA Pre-procedure pain score:  6/10 Post-procedure pain score: 0/10         Influential Factors: BMI: 30.90 kg/m Intra-procedural challenges: None observed.          Assessment challenges: None detected.              Reported side-effects: None.        Post-procedural adverse reactions or complications: None reported         Sedation: Please see nurses note. When no sedatives are used, the analgesic levels obtained are directly associated to the effectiveness of the local anesthetics. However, when sedation is provided, the level of analgesia obtained during the initial 1 hour following the intervention, is believed to be the result of a combination of factors. These factors may include, but are not limited to: 1. The effectiveness of the local anesthetics used. 2. The effects of the analgesic(s) and/or anxiolytic(s) used. 3. The degree of discomfort experienced by the patient at the time of the procedure. 4. The patients ability and reliability in recalling and recording the events. 5. The presence and influence of possible secondary gains and/or psychosocial factors. Reported result: Relief experienced during the 1st hour after the procedure: 100 % (Ultra-Short Term Relief)            Interpretative annotation:  Clinically appropriate result. Analgesia during this period is likely to be Local Anesthetic and/or IV Sedative (Analgesic/Anxiolytic) related.          Effects of local anesthetic: The analgesic effects attained during this period are directly associated to the localized infiltration of local anesthetics and therefore cary significant diagnostic value as to the etiological location, or anatomical origin, of the pain. Expected duration of relief is directly dependent on the pharmacodynamics of the local anesthetic used. Long-acting (4-6 hours) anesthetics used.  Reported result: Relief during the next 4 to 6 hour after the procedure: 90 % (Short-Term Relief)            Interpretative annotation: Clinically appropriate result. Analgesia during this period is likely to be Local Anesthetic-related.          Long-term benefit: Defined as the period of  time past the expected duration of local anesthetics (1 hour for short-acting and 4-6 hours for long-acting). With the possible exception of prolonged sympathetic blockade from the local anesthetics, benefits during this period are typically attributed to, or associated with, other factors such as analgesic sensory neuropraxia, antiinflammatory effects, or beneficial biochemical changes provided by agents other than the local anesthetics.  Reported result: Extended relief following procedure: 80 %(relief has been 80% better until 2 weeks ago) (Long-Term Relief)            Interpretative annotation: Clinically appropriate result. Good relief. No permanent benefit expected. Inflammation plays a part in the etiology to the pain.          Current benefits: Defined as reported results that persistent at this point in time.   Analgesia: 50 %            Function: Ms. Arnett reports improvement in function ROM: Ms. Hole reports improvement in ROM Interpretative annotation: Ongoing benefit. Therapeutic success. Adequate RF ablation.          Interpretation: Results would suggest adequate radiofrequency ablation.                  Plan:  Please see "Plan of Care" for details.        Laboratory Chemistry  Inflammation Markers (CRP: Acute Phase) (ESR: Chronic Phase) No results found for: CRP, ESRSEDRATE, LATICACIDVEN               Rheumatology Markers No results found for: RF, ANA, LABURIC, URICUR, LYMEIGGIGMAB, [facility]              Renal Function Markers Lab Results  Component Value Date   BUN 20 10/16/2017   CREATININE 0.91 10/16/2017   GFRAA >60 10/16/2017   GFRNONAA 58 (L) 10/16/2017                 Hepatic Function Markers Lab Results  Component Value Date   AST 18 09/20/2017   ALT 18 09/20/2017   ALBUMIN 4.0 09/20/2017   ALKPHOS 51 09/20/2017                 Electrolytes Lab Results  Component Value Date   NA 130 (L) 10/16/2017   K 3.5 10/16/2017   CL 96 (L) 10/16/2017    CALCIUM 9.0 10/16/2017   MG 1.8 04/21/2013                 Neuropathy Markers Lab Results  Component Value Date   VITAMINB12 881 02/01/2016                 Bone Pathology Markers No  results found for: Marveen Reeks, [id number], [id number], 25OHVITD1, 25OHVITD2, 25OHVITD3, TESTOFREE, TESTOSTERONE               Coagulation Parameters Lab Results  Component Value Date   PLT 293.0 09/20/2017                 Cardiovascular Markers Lab Results  Component Value Date   TROPONINI < 0.02 10/09/2014   HGB 12.0 09/20/2017   HCT 35.8 (L) 09/20/2017                 CA Markers Lab Results  Component Value Date   LABCA2 24.5 08/07/2013                 Note: Lab results reviewed.  Recent Diagnostic Imaging Results  DG C-Arm 1-60 Min-No Report Fluoroscopy was utilized by the requesting physician.  No radiographic  interpretation.   Complexity Note: Imaging results reviewed. Results shared with Ms. Ermalinda Barrios, using Layman's terms.                         Meds   Current Outpatient Medications:  .  amLODipine (NORVASC) 2.5 MG tablet, TAKE 1 TABLET BY MOUTH ONCE DAILY., Disp: 30 tablet, Rfl: 5 .  aspirin 81 MG EC tablet, Take 81 mg by mouth daily as needed.  , Disp: , Rfl:  .  Calcium Carbonate (CALCIUM 600) 1500 MG TABS, Take 1 tablet by mouth daily.  , Disp: , Rfl:  .  carbidopa-levodopa (SINEMET IR) 25-100 MG tablet, Take by mouth., Disp: , Rfl:  .  citalopram (CELEXA) 20 MG tablet, TAKE 1 & 1/2 TABLETS BY MOUTH ONCE DAILY, Disp: , Rfl:  .  [START ON 01/25/2018] cyclobenzaprine (FLEXERIL) 10 MG tablet, Take 1 tablet (10 mg total) by mouth at bedtime., Disp: 90 tablet, Rfl: 0 .  [START ON 01/25/2018] gabapentin (NEURONTIN) 300 MG capsule, Take 1 capsule (300 mg total) by mouth 2 (two) times daily., Disp: 180 capsule, Rfl: 0 .  GLUCOSAMINE SULFATE PO, Take by mouth daily. , Disp: , Rfl:  .  hydrochlorothiazide (HYDRODIURIL) 25 MG tablet, TAKE 1 TABLET BY MOUTH ONCE DAILY. (PATIENT TAKES  1/2 ONCE DAILY AND EXTRA 1/2 IF NEEDED), Disp: 15 tablet, Rfl: 0 .  HYDROcodone-acetaminophen (NORCO/VICODIN) 5-325 MG tablet, Take 1 tablet by mouth every 8 (eight) hours as needed for severe pain., Disp: 90 tablet, Rfl: 0 .  letrozole (FEMARA) 2.5 MG tablet, TAKE 1 TABLET BY MOUTH ONCE DAILY., Disp: 30 tablet, Rfl: 2 .  losartan (COZAAR) 100 MG tablet, TAKE 1 TABLET BY MOUTH ONCE DAILY., Disp: 30 tablet, Rfl: 0 .  lubiprostone (AMITIZA) 24 MCG capsule, TAKE 1 CAPSULE BY MOUTH ONCE DAILY WITH BREAKFAST., Disp: , Rfl:  .  [START ON 01/25/2018] magnesium oxide (MAGNESIUM-OXIDE) 400 (241.3 Mg) MG tablet, Take 1 tablet (400 mg total) by mouth daily., Disp: 90 tablet, Rfl: 0 .  meclizine (ANTIVERT) 12.5 MG tablet, Take 1 tablet (12.5 mg total) by mouth 2 (two) times daily as needed for dizziness., Disp: 20 tablet, Rfl: 0 .  [START ON 01/25/2018] meloxicam (MOBIC) 7.5 MG tablet, Take 1 tablet (7.5 mg total) by mouth daily., Disp: 90 tablet, Rfl: 0 .  metoprolol succinate (TOPROL-XL) 25 MG 24 hr tablet, TAKE 1 TABLET BY MOUTH TWICE DAILY, Disp: 60 tablet, Rfl: 0 .  Multiple Vitamin (MULTIVITAMIN) tablet, Take 1 tablet by mouth daily., Disp: , Rfl:  .  Omega-3 1000 MG CAPS, Take by mouth  daily. , Disp: , Rfl:  .  omeprazole (PRILOSEC) 20 MG capsule, TAKE 1 CAPSULE BY MOUTH TWICE DAILY FOR REFLUX., Disp: 60 capsule, Rfl: 1 .  pantoprazole (PROTONIX) 40 MG tablet, TAKE 1 TABLET BY MOUTH ONCE DAILY., Disp: 30 tablet, Rfl: 0 .  pravastatin (PRAVACHOL) 20 MG tablet, TAKE 1 TABLET BY MOUTH ONCE DAILY., Disp: 30 tablet, Rfl: 0 .  [START ON 03/02/2018] HYDROcodone-acetaminophen (NORCO/VICODIN) 5-325 MG tablet, Take 1 tablet by mouth every 8 (eight) hours as needed for severe pain., Disp: 90 tablet, Rfl: 0 .  [START ON 01/31/2018] HYDROcodone-acetaminophen (NORCO/VICODIN) 5-325 MG tablet, Take 1 tablet by mouth every 8 (eight) hours as needed for severe pain., Disp: 90 tablet, Rfl: 0  ROS  Constitutional: Denies any  fever or chills Gastrointestinal: No reported hemesis, hematochezia, vomiting, or acute GI distress Musculoskeletal: Denies any acute onset joint swelling, redness, loss of ROM, or weakness Neurological: No reported episodes of acute onset apraxia, aphasia, dysarthria, agnosia, amnesia, paralysis, loss of coordination, or loss of consciousness  Allergies  Ms. Kemler is allergic to sulfa antibiotics and vesicare [solifenacin].  [place]  Drug: Ms. Kleckley  reports that she does not use drugs. Alcohol:  reports that she does not drink alcohol. Tobacco:  reports that  has never smoked. she has never used smokeless tobacco. Medical:  has a past medical history of Anemia, Arm pain (07/26/2015), Arthritis, Arthritis, degenerative (03/26/2014), Back pain (11/01/2013), Breast cancer ([place]), Breast cancer ([place]), CHEST PAIN (04/29/2010), Chronic cystitis, Cystocele (02/22/2013), Cystocele, midline (08/19/2013), Degeneration of intervertebral disc of lumbosacral region (03/26/2014), DYSPNEA (04/29/2010), Enthesopathy of hip (03/26/2014), GERD (gastroesophageal reflux disease), Hiatal hernia, HTN (hypertension), LBP (low back pain) (03/26/2014), Neck pain (11/01/2013), Parkinson disease ([place]), Sinusitis (02/07/2015), Skin lesions (07/12/2014), and Urinary incontinence. Surgical: Ms. Coin  has a past surgical history that includes Breast biopsy (2013); Tonsillectomy and adenoidectomy (79); Vesicovaginal fistula closure w/ TAH (1983); Breast surgery (1986); Breast enhancement surgery (1987); Breast implant removal; Breast implant removal (Right, 08/29/2012); Mastectomy (08/2012); Abdominal hysterectomy; and Colonoscopy with propofol (N/A, 09/13/2016). Family: family history includes Colon polyps in her father; Diabetes in her father; Parkinson's disease in her mother; Stroke in her father and mother.  Constitutional Exam  General appearance: Well nourished, well developed, and well hydrated. In no apparent acute distress Vitals:    01/02/18 1035 01/02/18 1038  BP: (!) 148/94   Pulse: 68   Resp: 16   Temp: 98.6 F (37 C)   TempSrc: Oral   SpO2: 100%   Weight:  180 lb (81.6 kg)  Height:  5' 4" (1.626 m)   BMI Assessment: Estimated body mass index is 30.9 kg/m as calculated from the following:   Height as of this encounter: 5' 4" (1.626 m).   Weight as of this encounter: 180 lb (81.6 kg). Psych/Mental status: Alert, oriented x 3 (person, place, & time)       Eyes: PERLA Respiratory: No evidence of acute respiratory distress  Cervical Spine Area Exam  Skin & Axial Inspection: No masses, redness, edema, swelling, or associated skin lesions Alignment: Symmetrical Functional ROM: Unrestricted ROM      Stability: No instability detected Muscle Tone/Strength: Functionally intact. No obvious neuro-muscular anomalies detected. Sensory (Neurological): Unimpaired Palpation: No palpable anomalies              Upper Extremity (UE) Exam    Side: Right upper extremity  Side: Left upper extremity  Skin & Extremity Inspection: Skin color, temperature, and hair growth are WNL. No peripheral  edema or cyanosis. No masses, redness, swelling, asymmetry, or associated skin lesions. No contractures.  Skin & Extremity Inspection: Skin color, temperature, and hair growth are WNL. No peripheral edema or cyanosis. No masses, redness, swelling, asymmetry, or associated skin lesions. No contractures.  Functional ROM: Unrestricted ROM          Functional ROM: Unrestricted ROM          Muscle Tone/Strength: Functionally intact. No obvious neuro-muscular anomalies detected.  Muscle Tone/Strength: Functionally intact. No obvious neuro-muscular anomalies detected.  Sensory (Neurological): Unimpaired          Sensory (Neurological): Unimpaired          Palpation: No palpable anomalies              Palpation: No palpable anomalies              Specialized Test(s): Deferred         Specialized Test(s): Deferred          Thoracic Spine Area Exam   Skin & Axial Inspection: No masses, redness, or swelling Alignment: Symmetrical Functional ROM: Unrestricted ROM Stability: No instability detected Muscle Tone/Strength: Functionally intact. No obvious neuro-muscular anomalies detected. Sensory (Neurological): Unimpaired Muscle strength & Tone: No palpable anomalies  Lumbar Spine Area Exam  Skin & Axial Inspection: No masses, redness, or swelling Alignment: Symmetrical Functional ROM: Unrestricted ROM      Stability: No instability detected Muscle Tone/Strength: Functionally intact. No obvious neuro-muscular anomalies detected. Sensory (Neurological): Unimpaired Palpation: Complains of area being tender to palpation       Provocative Tests: Lumbar Hyperextension and rotation test: evaluation deferred today       Lumbar Lateral bending test: evaluation deferred today       Patrick's Maneuver: evaluation deferred today                    Gait & Posture Assessment  Ambulation: Unassisted Gait: Relatively normal for age and body habitus Posture: WNL   Lower Extremity Exam    Side: Right lower extremity  Side: Left lower extremity  Skin & Extremity Inspection: Skin color, temperature, and hair growth are WNL. No peripheral edema or cyanosis. No masses, redness, swelling, asymmetry, or associated skin lesions. No contractures.  Skin & Extremity Inspection: Skin color, temperature, and hair growth are WNL. No peripheral edema or cyanosis. No masses, redness, swelling, asymmetry, or associated skin lesions. No contractures.  Functional ROM: Unrestricted ROM          Functional ROM: Unrestricted ROM          Muscle Tone/Strength: Functionally intact. No obvious neuro-muscular anomalies detected.  Muscle Tone/Strength: Functionally intact. No obvious neuro-muscular anomalies detected.  Sensory (Neurological): Unimpaired  Sensory (Neurological): Unimpaired  Palpation: No palpable anomalies  Palpation: No palpable anomalies   Assessment   Primary Diagnosis & Pertinent Problem List: The primary encounter diagnosis was Chronic low back pain (Primary Source of Pain) (Bilateral) (R>L). Diagnoses of Chronic cervical radicular pain (Right), Chronic pain of right hip, Primary osteoarthritis involving multiple joints, Chronic pain syndrome, Diffuse myofascial pain syndrome, and Neurogenic pain were also pertinent to this visit.  Status Diagnosis  Controlled Controlled Controlled 1. Chronic low back pain (Primary Source of Pain) (Bilateral) (R>L)   2. Chronic cervical radicular pain (Right)   3. Chronic pain of right hip   4. Primary osteoarthritis involving multiple joints   5. Chronic pain syndrome   6. Diffuse myofascial pain syndrome   7. Neurogenic pain  Problems updated and reviewed during this visit: No problems updated. Plan of Care  Pharmacotherapy (Medications Ordered): Meds ordered this encounter  Medications  . HYDROcodone-acetaminophen (NORCO/VICODIN) 5-325 MG tablet    Sig: Take 1 tablet by mouth every 8 (eight) hours as needed for severe pain.    Dispense:  90 tablet    Refill:  0    Do not place this medication, or any other prescription from our practice, on "Automatic Refill". Patient may have prescription filled one day early if pharmacy is closed on scheduled refill date. Do not fill until: 03/02/2018 To last until: 04/01/2018    Order Specific Question:   Supervising Provider    Answer:   Milinda Pointer [phone]  . HYDROcodone-acetaminophen (NORCO/VICODIN) 5-325 MG tablet    Sig: Take 1 tablet by mouth every 8 (eight) hours as needed for severe pain.    Dispense:  90 tablet    Refill:  0    Do not place this medication, or any other prescription from our practice, on "Automatic Refill". Patient may have prescription filled one day early if pharmacy is closed on scheduled refill date. Do not fill until:01/31/2018 To last until: 03/02/2018    Order Specific Question:   Supervising Provider     Answer:   Milinda Pointer [phone]  . cyclobenzaprine (FLEXERIL) 10 MG tablet    Sig: Take 1 tablet (10 mg total) by mouth at bedtime.    Dispense:  90 tablet    Refill:  0    Do not place this medication, or any other prescription from our practice, on "Automatic Refill". Patient may have prescription filled one day early if pharmacy is closed on scheduled refill date.    Order Specific Question:   Supervising Provider    Answer:   Milinda Pointer [phone]  . magnesium oxide (MAGNESIUM-OXIDE) 400 (241.3 Mg) MG tablet    Sig: Take 1 tablet (400 mg total) by mouth daily.    Dispense:  90 tablet    Refill:  0    Do not place this medication, or any other prescription from our practice, on "Automatic Refill". Patient may have prescription filled one day early if pharmacy is closed on scheduled refill date.    Order Specific Question:   Supervising Provider    Answer:   Milinda Pointer [phone]  . gabapentin (NEURONTIN) 300 MG capsule    Sig: Take 1 capsule (300 mg total) by mouth 2 (two) times daily.    Dispense:  180 capsule    Refill:  0    Do not place this medication, or any other prescription from our practice, on "Automatic Refill". Patient may have prescription filled one day early if pharmacy is closed on scheduled refill date.    Order Specific Question:   Supervising Provider    Answer:   Milinda Pointer [phone]  . meloxicam (MOBIC) 7.5 MG tablet    Sig: Take 1 tablet (7.5 mg total) by mouth daily.    Dispense:  90 tablet    Refill:  0    Do not place this medication, or any other prescription from our practice, on "Automatic Refill". Patient may have prescription filled one day early if pharmacy is closed on scheduled refill date.    Order Specific Question:   Supervising Provider    Answer:   Milinda Pointer [phone]  . HYDROcodone-acetaminophen (NORCO/VICODIN) 5-325 MG tablet    Sig: Take 1 tablet by mouth every 8 (eight) hours as needed for severe pain.  Dispense:  90 tablet    Refill:  0    Do not place this medication, or any other prescription from our practice, on "Automatic Refill". Patient may have prescription filled one day early if pharmacy is closed on scheduled refill date. Do not fill until:01/01/2018 To last until: 01/31/2018    Order Specific Question:   Supervising Provider    Answer:   Milinda Pointer [phone]   New Prescriptions   No medications on file   Medications administered today: [employer]" had no medications administered during this visit. Lab-work, procedure(s), and/or referral(s): No orders of the defined types were placed in this encounter.  Imaging and/or referral(s): None  Interventional therapies: Planned, scheduled, and/or pending:  As scheduled   Considering:  Diagnostic right-sided lumbar facet block  Palliative right-sided lumbar facet radiofrequency ablation  Palliative Right suprascapular muscle trigger point injection  Diagnostic bilateral cervical facet block  Possible bilateral cervical facet radiofrequency ablation  Diagnostic right-sided cervical epidural steroid injection  Diagnostic intra-articular hip joint injection  Possible hip joint radiofrequency ablation **   Palliative PRN treatment(s):  Diagnostic right-sided lumbar facet block  Palliative right-sided lumbar facet radiofrequency ablation  Palliative Right suprascapular muscle trigger point injection  Diagnostic bilateral cervical facet block  Diagnostic right-sided cervical epidural steroid injection  Diagnostic intra-articular hip joint injection    Provider-requested follow-up: Return in about 3 months (around 04/02/2018) for MedMgmt with Me Dionisio David).  Future Appointments  Date Time Provider [place]  02/12/2018 10:30 AM Milinda Pointer, MD [facility] None  03/01/2018  9:00 AM [facility]  03/04/2018 11:00 AM Einar Pheasant, MD [facility]  04/02/2018 10:30 AM Vevelyn Francois, NP [facility] None  04/09/2018  2:30 PM Grayland Ormond, Kathlene November, MD [facility] None   Primary Care Physician: Einar Pheasant, MD Location: [facility] Note by: Vevelyn Francois NP Date: 01/02/2018; Time: 3:41 PM  Pain Score Disclaimer: We use the NRS-11 scale. This is a self-reported, subjective measurement of pain severity with only modest accuracy. It is used primarily to identify changes within a particular patient. It must be understood that outpatient pain scales are significantly less accurate that those used for research, where they can be applied under ideal controlled circumstances with minimal exposure to variables. In reality, the score is likely to be a combination of pain intensity and pain affect, where pain affect describes the degree of emotional arousal or changes in action readiness caused by the sensory experience of pain. Factors such as social and work situation, setting, emotional state, anxiety levels, expectation, and prior pain experience may influence pain perception and show large inter-individual differences that may also be affected by time variables.  Patient instructions provided during this appointment: Patient Instructions    ____________________________________________________________________________________________  Medication Rules  Applies to: All patients receiving prescriptions (written or electronic).  Pharmacy of record: Pharmacy where electronic prescriptions will be sent. If written prescriptions are taken to a different pharmacy, please inform the nursing staff. The pharmacy listed in the electronic medical record should be the one where you would like electronic prescriptions to be sent.  Prescription refills: Only during scheduled appointments. Applies to both, written and electronic prescriptions.  NOTE: The following applies primarily to controlled substances (Opioid* Pain Medications).   Patient's  responsibilities: 1. Pain Pills: Bring all pain pills to every appointment (except for procedure appointments). 2. Pill Bottles: Bring pills in original pharmacy bottle. Always bring newest bottle. Bring bottle, even if empty.  3. Medication refills: You are responsible for knowing and keeping track of what medications you need refilled. The day before your appointment, write a list of all prescriptions that need to be refilled. Bring that list to your appointment and give it to the admitting nurse. Prescriptions will be written only during appointments. If you forget a medication, it will not be "Called in", "Faxed", or "electronically sent". You will need to get another appointment to get these prescribed. 4. Prescription Accuracy: You are responsible for carefully inspecting your prescriptions before leaving our office. Have the discharge nurse carefully go over each prescription with you, before taking them home. Make sure that your name is accurately spelled, that your address is correct. Check the name and dose of your medication to make sure it is accurate. Check the number of pills, and the written instructions to make sure they are clear and accurate. Make sure that you are given enough medication to last until your next medication refill appointment. 5. Taking Medication: Take medication as prescribed. Never take more pills than instructed. Never take medication more frequently than prescribed. Taking less pills or less frequently is permitted and encouraged, when it comes to controlled substances (written prescriptions).  6. Inform other Doctors: Always inform, all of your healthcare providers, of all the medications you take. 7. Pain Medication from other Providers: You are not allowed to accept any additional pain medication from any other Doctor or Healthcare provider. There are two exceptions to this rule. (see below) In the event that you require additional pain medication, you are responsible  for notifying us, as stated below. 8. Medication Agreement: You are responsible for carefully reading and following our Medication Agreement. This must be signed before receiving any prescriptions from our practice. Safely store a copy of your signed Agreement. Violations to the Agreement will result in no further prescriptions. (Additional copies of our Medication Agreement are available upon request.) 9. Laws, Rules, & Regulations: All patients are expected to follow all Federal and [employer], Rules, [employer]. Ignorance of the Laws does not constitute a valid excuse. The use of any illegal substances is prohibited. 10. Adopted CDC guidelines & recommendations: Target dosing levels will be at or below 60 MME/day. Use of benzodiazepines** is not recommended.  Exceptions: There are only two exceptions to the rule of not receiving pain medications from other Healthcare Providers. 1. Exception #1 (Emergencies): In the event of an emergency (i.e.: accident requiring emergency care), you are allowed to receive additional pain medication. However, you are responsible for: As soon as you are able, call our office [phone], at any time of the day or night, and leave a message stating your name, the date and nature of the emergency, and the name and dose of the medication prescribed. In the event that your call is answered by a member of our staff, make sure to document and save the date, time, and the name of the person that took your information.  2. Exception #2 (Planned Surgery): In the event that you are scheduled by another doctor or dentist to have any type of surgery or procedure, you are allowed (for a period no longer than 30 days), to receive additional pain medication, for the acute post-op pain. However, in this case, you are responsible for picking up a copy of our "Post-op Pain Management for Surgeons" handout, and giving it to your surgeon or dentist. This document is available at  our office, and does not require an appointment  to obtain it. Simply go to our office during business hours (Monday-Thursday from 8:00 AM to 4:00 PM) (Friday 8:00 AM to 12:00 Noon) or if you have a scheduled appointment with [place], prior to your surgery, and ask for it by name. In addition, you will need to provide [place] with your name, name of your surgeon, type of surgery, and date of procedure or surgery.  *Opioid medications include: morphine, codeine, oxycodone, oxymorphone, hydrocodone, hydromorphone, meperidine, tramadol, tapentadol, buprenorphine, fentanyl, methadone. **Benzodiazepine medications include: diazepam (Valium), alprazolam (Xanax), clonazepam (Klonopine), lorazepam (Ativan), clorazepate (Tranxene), chlordiazepoxide (Librium), estazolam (Prosom), oxazepam (Serax), temazepam (Restoril), triazolam (Halcion)  ____________________________________________________________________________________________  ____________________________________________________________________________________________  Pain Scale  Introduction: The pain score used by this practice is the Verbal Numerical Rating Scale (VNRS-11). This is an 11-point scale. It is for adults and children 10 years or older. There are significant differences in how the pain score is reported, used, and applied. Forget everything you learned in the past and learn this scoring system.  General Information: The scale should reflect your current level of pain. Unless you are specifically asked for the level of your worst pain, or your average pain. If you are asked for one of these two, then it should be understood that it is over the past 24 hours.  Basic Activities of Daily Living (ADL): Personal hygiene, dressing, eating, transferring, and using restroom.  Instructions: Most patients tend to report their level of pain as a combination of two factors, their physical pain and their psychosocial pain. This last one is also known as  "suffering" and it is reflection of how physical pain affects you socially and psychologically. From now on, report them separately. From this point on, when asked to report your pain level, report only your physical pain. Use the following table for reference.  Pain Clinic Pain Levels (0-5/10)  Pain Level Score  Description  No Pain 0   Mild pain 1 Nagging, annoying, but does not interfere with basic activities of daily living (ADL). Patients are able to eat, bathe, get dressed, toileting (being able to get on and off the toilet and perform personal hygiene functions), transfer (move in and out of bed or a chair without assistance), and maintain continence (able to control bladder and bowel functions). Blood pressure and heart rate are unaffected. A normal heart rate for a healthy adult ranges from 60 to 100 bpm (beats per minute).   Mild to moderate pain 2 Noticeable and distracting. Impossible to hide from other people. More frequent flare-ups. Still possible to adapt and function close to normal. It can be very annoying and may have occasional stronger flare-ups. With discipline, patients may get used to it and adapt.   Moderate pain 3 Interferes significantly with activities of daily living (ADL). It becomes difficult to feed, bathe, get dressed, get on and off the toilet or to perform personal hygiene functions. Difficult to get in and out of bed or a chair without assistance. Very distracting. With effort, it can be ignored when deeply involved in activities.   Moderately severe pain 4 Impossible to ignore for more than a few minutes. With effort, patients may still be able to manage work or participate in some social activities. Very difficult to concentrate. Signs of autonomic nervous system discharge are evident: dilated pupils (mydriasis); mild sweating (diaphoresis); sleep interference. Heart rate becomes elevated (>115 bpm). Diastolic blood pressure (lower number) rises above 100 mmHg.  Patients find relief in laying down and not moving.   Severe pain  5 Intense and extremely unpleasant. Associated with frowning face and frequent crying. Pain overwhelms the senses.  Ability to do any activity or maintain social relationships becomes significantly limited. Conversation becomes difficult. Pacing back and forth is common, as getting into a comfortable position is nearly impossible. Pain wakes you up from deep sleep. Physical signs will be obvious: pupillary dilation; increased sweating; goosebumps; brisk reflexes; cold, clammy hands and feet; nausea, vomiting or dry heaves; loss of appetite; significant sleep disturbance with inability to fall asleep or to remain asleep. When persistent, significant weight loss is observed due to the complete loss of appetite and sleep deprivation.  Blood pressure and heart rate becomes significantly elevated. Caution: If elevated blood pressure triggers a pounding headache associated with blurred vision, then the patient should immediately seek attention at an urgent or emergency care unit, as these may be signs of an impending stroke.    Emergency Department Pain Levels (6-10/10)  Emergency Room Pain 6 Severely limiting. Requires emergency care and should not be seen or managed at an outpatient pain management facility. Communication becomes difficult and requires great effort. Assistance to reach the emergency department may be required. Facial flushing and profuse sweating along with potentially dangerous increases in heart rate and blood pressure will be evident.   Distressing pain 7 Self-care is very difficult. Assistance is required to transport, or use restroom. Assistance to reach the emergency department will be required. Tasks requiring coordination, such as bathing and getting dressed become very difficult.   Disabling pain 8 Self-care is no longer possible. At this level, pain is disabling. The individual is unable to do even the most "basic"  activities such as walking, eating, bathing, dressing, transferring to a bed, or toileting. Fine motor skills are lost. It is difficult to think clearly.   Incapacitating pain 9 Pain becomes incapacitating. Thought processing is no longer possible. Difficult to remember your own name. Control of movement and coordination are lost.   The worst pain imaginable 10 At this level, most patients pass out from pain. When this level is reached, collapse of the autonomic nervous system occurs, leading to a sudden drop in blood pressure and heart rate. This in turn results in a temporary and dramatic drop in blood flow to the brain, leading to a loss of consciousness. Fainting is one of the body's self defense mechanisms. Passing out puts the brain in a calmed state and causes it to shut down for a while, in order to begin the healing process.    Summary: 1. Refer to this scale when providing [place] with your pain level. 2. Be accurate and careful when reporting your pain level. This will help with your care. 3. Over-reporting your pain level will lead to loss of credibility. 4. Even a level of 1/10 means that there is pain and will be treated at our facility. 5. High, inaccurate reporting will be documented as "Symptom Exaggeration", leading to loss of credibility and suspicions of possible secondary gains such as obtaining more narcotics, or wanting to appear disabled, for fraudulent reasons. 6. Only pain levels of 5 or below will be seen at our facility. 7. Pain levels of 6 and above will be sent to the Emergency Department and the appointment cancelled. ____________________________________________________________________________________________

## 2018-01-02 NOTE — Progress Notes (Signed)
Nursing Pain Medication Assessment:  Safety precautions to be maintained throughout the outpatient stay will include: orient to surroundings, keep bed in low position, maintain call bell within reach at all times, provide assistance with transfer out of bed and ambulation.  Medication Inspection Compliance: Pill count conducted under aseptic conditions, in front of the patient. Neither the pills nor the bottle was removed from the patient's sight at any time. Once count was completed pills were immediately returned to the patient in their original bottle.  Medication: Hydrocodone/APAP Pill/Patch Count: 89 of 90 pills remain Pill/Patch Appearance: Markings consistent with prescribed medication Bottle Appearance: Standard pharmacy container. Clearly labeled. Filled Date: 01 / 15 / 2019 Last Medication intake:  Today

## 2018-01-02 NOTE — Patient Instructions (Addendum)

## 2018-01-10 ENCOUNTER — Ambulatory Visit: Payer: Medicare Other | Admitting: Pain Medicine

## 2018-01-10 ENCOUNTER — Ambulatory Visit: Payer: Medicare Other | Admitting: Nurse Practitioner

## 2018-01-23 ENCOUNTER — Other Ambulatory Visit: Payer: Self-pay | Admitting: Internal Medicine

## 2018-02-12 ENCOUNTER — Encounter: Payer: Self-pay | Admitting: Pain Medicine

## 2018-02-12 ENCOUNTER — Ambulatory Visit (HOSPITAL_BASED_OUTPATIENT_CLINIC_OR_DEPARTMENT_OTHER): Payer: Medicare Other | Admitting: Pain Medicine

## 2018-02-12 ENCOUNTER — Other Ambulatory Visit: Payer: Self-pay

## 2018-02-12 ENCOUNTER — Ambulatory Visit
Admission: RE | Admit: 2018-02-12 | Discharge: 2018-02-12 | Disposition: A | Discharge status: Home / Self Care | Payer: Medicare Other | Source: Ambulatory Visit | Attending: Pain Medicine | Admitting: Pain Medicine

## 2018-02-12 VITALS — BP 170/63 | HR 73 | Temp 96.7°F | Resp 18 | Ht 64.0 in | Wt 180.0 lb

## 2018-02-12 DIAGNOSIS — G8918 Other acute postprocedural pain: Secondary | ICD-10-CM | POA: Diagnosis not present

## 2018-02-12 DIAGNOSIS — M549 Dorsalgia, unspecified: Secondary | ICD-10-CM | POA: Insufficient documentation

## 2018-02-12 DIAGNOSIS — M47817 Spondylosis without myelopathy or radiculopathy, lumbosacral region: Secondary | ICD-10-CM | POA: Insufficient documentation

## 2018-02-12 DIAGNOSIS — M47816 Spondylosis without myelopathy or radiculopathy, lumbar region: Secondary | ICD-10-CM

## 2018-02-12 DIAGNOSIS — M545 Low back pain, unspecified: Secondary | ICD-10-CM

## 2018-02-12 DIAGNOSIS — G8929 Other chronic pain: Secondary | ICD-10-CM

## 2018-02-12 MED ORDER — ROPIVACAINE HCL 2 MG/ML IJ SOLN
9.0000 mL | Freq: Once | INTRAMUSCULAR | Status: AC
  Administered 2018-02-12: 10 mL via PERINEURAL
  Filled 2018-02-12: qty 10

## 2018-02-12 MED ORDER — TRIAMCINOLONE ACETONIDE 40 MG/ML IJ SUSP
40.0000 mg | Freq: Once | INTRAMUSCULAR | Status: AC
  Administered 2018-02-12: 40 mg
  Filled 2018-02-12: qty 1

## 2018-02-12 MED ORDER — ROPIVACAINE HCL 2 MG/ML IJ SOLN
4.0000 mL | Freq: Once | INTRAMUSCULAR | Status: AC
  Administered 2018-02-12: 4 mL
  Filled 2018-02-12: qty 10

## 2018-02-12 MED ORDER — MIDAZOLAM HCL 5 MG/5ML IJ SOLN
1.0000 mg | INTRAMUSCULAR | Status: DC | PRN
  Filled 2018-02-12: qty 5

## 2018-02-12 MED ORDER — OXYCODONE-ACETAMINOPHEN 5-325 MG PO TABS
1.0000 | ORAL_TABLET | Freq: Three times a day (TID) | ORAL | 0 refills | Status: AC | PRN
Start: 2018-02-12 — End: 2018-02-19

## 2018-02-12 MED ORDER — LIDOCAINE HCL 2 % IJ SOLN
10.0000 mL | Freq: Once | INTRAMUSCULAR | Status: AC
  Administered 2018-02-12: 400 mg
  Filled 2018-02-12: qty 40

## 2018-02-12 MED ORDER — FENTANYL CITRATE (PF) 100 MCG/2ML IJ SOLN
25.0000 ug | INTRAMUSCULAR | Status: DC | PRN
  Administered 2018-02-12: 100 ug via INTRAVENOUS
  Filled 2018-02-12: qty 2

## 2018-02-12 MED ORDER — ROPIVACAINE HCL 2 MG/ML IJ SOLN
9.0000 mL | Freq: Once | INTRAMUSCULAR | Status: AC
  Administered 2018-02-12: 9 mL

## 2018-02-12 MED ORDER — LACTATED RINGERS IV SOLN
1000.0000 mL | Freq: Once | INTRAVENOUS | Status: AC
  Administered 2018-02-12: 1000 mL via INTRAVENOUS

## 2018-02-12 NOTE — Progress Notes (Signed)
Patient's Name: Wanda Martinez  MRN: [id number]  Referring Provider: Einar Pheasant, MD  DOB: December 01, 1935  PCP: Einar Pheasant, MD  DOS: 02/12/2018  Note by: Gaspar Cola, MD  Service setting: Ambulatory outpatient  Specialty: Interventional Pain Management  Patient type: Established  Location: [facility] (AMB) Pain Management Facility  Visit type: Interventional Procedure   Primary Reason for Visit: Interventional Pain Management Treatment. CC: Back Pain (lower)  Procedure #1:       Type: Trigger Point Injection Level: PSIS Laterality: Right side Position: Supine Target Muscle: Quadratus lumborum (QL) and erector spinae (ES) muscle Approach: Posterior percutaneous Region: Posterior  Lumbar area. Primary Purpose: Therapeutic   Indications: 1. Trigger point with back pain (Right)    Procedure #2:  Anesthesia, Analgesia, Anxiolysis:  Type: Thermal Lumbar Facet, Medial Branch Radiofrequency Neurotomy Level: L2, L3, L4, L5, & S1 Medial Branch Level(s). Lesioning of these levels should completely denervate the L3-4, L4-5, and the L5-S1 lumbar facet joints. Primary Purpose: Therapeutic Region: Posterolateral Lumbosacral Spine Laterality: Left  Type: Local Anesthesia with Moderate (Conscious) Sedation Local Anesthetic: Lidocaine 1% Route: Intravenous (IV) IV Access: Secured Sedation: Meaningful verbal contact was maintained at all times during the procedure  Indication(s): Analgesia and Anxiety   Indications: 1. Spondylosis without myelopathy or radiculopathy, lumbosacral region   2. Lumbar facet syndrome (Bilateral) (R>L)   3. Chronic low back pain (Primary Source of Pain) (Bilateral) (R>L)   4. Acute postoperative pain    [name] has been dealing with the above chronic pain for longer than three months and has either failed to respond, was unable to tolerate, or simply did not get enough benefit from other more conservative therapies including, but not limited to: 1.  Over-the-counter medications 2. Anti-inflammatory medications 3. Muscle relaxants 4. Membrane stabilizers 5. Opioids 6. Physical therapy 7. Modalities (Heat, ice, etc.) 8. Invasive techniques such as nerve blocks. [name] has attained more than 50% relief of the pain from a series of diagnostic injections conducted in separate occasions.  Pain Score: Pre-procedure: 8 /10 Post-procedure: 0-No pain/10  Pre-op Assessment:  [name] is a 83 y.o. (year old), female patient, seen today for interventional treatment. She  has a past surgical history that includes Breast biopsy (2013); Tonsillectomy and adenoidectomy (79); Vesicovaginal fistula closure w/ TAH (1983); Breast surgery (1986); Breast enhancement surgery (1987); Breast implant removal; Breast implant removal (Right, 08/29/2012); Mastectomy (08/2012); Abdominal hysterectomy; and Colonoscopy with propofol (N/A, 09/13/2016). [name] has a current medication list which includes the following prescription(s): amlodipine, aspirin, calcium carbonate, carbidopa-levodopa, citalopram, cyclobenzaprine, gabapentin, glucosamine sulfate, hydrochlorothiazide, hydrocodone-acetaminophen, hydrocodone-acetaminophen, letrozole, losartan, lubiprostone, magnesium oxide, meclizine, meloxicam, metoprolol succinate, multivitamin, omega-3, omeprazole, pantoprazole, pravastatin, hydrocodone-acetaminophen, and oxycodone-acetaminophen, and the following Facility-Administered Medications: fentanyl and midazolam. Her primarily concern today is the Back Pain (lower)  Initial Vital Signs:  Pulse Rate: 73 Temp: 98.4 F (36.9 C) Resp: 16 BP: 123/68 SpO2: 95 %  BMI: Estimated body mass index is 30.9 kg/m as calculated from the following:   Height as of this encounter: 5\' 4"  (1.626 m).   Weight as of this encounter: 180 lb (81.6 kg).  Risk Assessment: Allergies: Reviewed. She is allergic to sulfa antibiotics and vesicare [solifenacin].  Allergy Precautions: None  required Coagulopathies: Reviewed. None identified.  Blood-thinner therapy: None at this time Active Infection(s): Reviewed. None identified. [name] is afebrile  Site Confirmation: [name] was asked to confirm the procedure and laterality before marking the site Procedure checklist: Completed Consent: Before the procedure and  under the influence of no sedative(s), amnesic(s), or anxiolytics, the patient was informed of the treatment options, risks and possible complications. To fulfill our ethical and legal obligations, as recommended by the American Medical Association's Code of Ethics, I have informed the patient of my clinical impression; the nature and purpose of the treatment or procedure; the risks, benefits, and possible complications of the intervention; the alternatives, including doing nothing; the risk(s) and benefit(s) of the alternative treatment(s) or procedure(s); and the risk(s) and benefit(s) of doing nothing. The patient was provided information about the general risks and possible complications associated with the procedure. These may include, but are not limited to: failure to achieve desired goals, infection, bleeding, organ or nerve damage, allergic reactions, paralysis, and death. In addition, the patient was informed of those risks and complications associated to Spine-related procedures, such as failure to decrease pain; infection (i.e.: Meningitis, epidural or intraspinal abscess); bleeding (i.e.: epidural hematoma, subarachnoid hemorrhage, or any other type of intraspinal or peri-dural bleeding); organ or nerve damage (i.e.: Any type of peripheral nerve, nerve root, or spinal cord injury) with subsequent damage to sensory, motor, and/or autonomic systems, resulting in permanent pain, numbness, and/or weakness of one or several areas of the body; allergic reactions; (i.e.: anaphylactic reaction); and/or death. Furthermore, the patient was informed of those risks and  complications associated with the medications. These include, but are not limited to: allergic reactions (i.e.: anaphylactic or anaphylactoid reaction(s)); adrenal axis suppression; blood sugar elevation that in diabetics may result in ketoacidosis or comma; water retention that in patients with history of congestive heart failure may result in shortness of breath, pulmonary edema, and decompensation with resultant heart failure; weight gain; swelling or edema; medication-induced neural toxicity; particulate matter embolism and blood vessel occlusion with resultant organ, and/or nervous system infarction; and/or aseptic necrosis of one or more joints. Finally, the patient was informed that Medicine is not an exact science; therefore, there is also the possibility of unforeseen or unpredictable risks and/or possible complications that may result in a catastrophic outcome. The patient indicated having understood very clearly. We have given the patient no guarantees and we have made no promises. Enough time was given to the patient to ask questions, all of which were answered to the patient's satisfaction. Ms. Mcglown has indicated that she wanted to continue with the procedure. Attestation: I, the ordering provider, attest that I have discussed with the patient the benefits, risks, side-effects, alternatives, likelihood of achieving goals, and potential problems during recovery for the procedure that I have provided informed consent. Date  Time: 02/12/2018 10:34 AM  Pre-Procedure Preparation:  Monitoring: As per clinic protocol. Respiration, ETCO2, SpO2, BP, heart rate and rhythm monitor placed and checked for adequate function Safety Precautions: Patient was assessed for positional comfort and pressure points before starting the procedure. Time-out: I initiated and conducted the "Time-out" before starting the procedure, as per protocol. The patient was asked to participate by confirming the accuracy of the  "Time Out" information. Verification of the correct person, site, and procedure were performed and confirmed by me, the nursing staff, and the patient. "Time-out" conducted as per Joint Commission's Universal Protocol (UP.01.01.01). Time: 1228  Description of Procedure #1:   Prepping solution: ChloraPrep (2% chlorhexidine gluconate and 70% isopropyl alcohol) Safety Precautions: Aspiration looking for blood return was conducted prior to all injections. At no point did we inject any substances, as a needle was being advanced. No attempts were made at seeking any paresthesias. Safe injection practices and needle  disposal techniques used. Medications properly checked for expiration dates. SDV (single dose vial) medications used. Description of the Procedure: Protocol guidelines were followed. The patient was placed in position over the fluoroscopy table. The target area was identified and the area prepped in the usual manner. Skin desensitized using vapocoolant spray. Skin & deeper tissues infiltrated with local anesthetic. Appropriate amount of time allowed to pass for local anesthetics to take effect. The procedure needles were then advanced to the target area. Proper needle placement secured. Negative aspiration confirmed. Solution injected in intermittent fashion, asking for systemic symptoms every 0.5cc of injectate. The needles were then removed and the area cleansed, making sure to leave some of the prepping solution back to take advantage of its long term bactericidal properties.  Start Time: 1230 hrs. End Time: 1231 hrs. (MNB) Materials:  Needle(s) Type: Regular needle Gauge: 25G Length: 1.5-in Medication(s): 0.2% Ropivacaine (4 mL) + Triamcinolone 40mg /mL (35mL).  Description of Procedure #2:  Position: Prone Laterality: Left Levels:  L2, L3, L4, L5, & S1 Medial Branch Level(s) Area Prepped: Lumbosacral Prepping solution: ChloraPrep (2% chlorhexidine gluconate and 70% isopropyl  alcohol) Safety Precautions: Aspiration looking for blood return was conducted prior to all injections. At no point did we inject any substances, as a needle was being advanced. Before injecting, the patient was told to immediately notify me if she was experiencing any new onset of "ringing in the ears, or metallic taste in the mouth". No attempts were made at seeking any paresthesias. Safe injection practices and needle disposal techniques used. Medications properly checked for expiration dates. SDV (single dose vial) medications used. After the completion of the procedure, all disposable equipment used was discarded in the proper designated medical waste containers. Local Anesthesia: Protocol guidelines were followed. The patient was positioned over the fluoroscopy table. The area was prepped in the usual manner. The time-out was completed. The target area was identified using fluoroscopy. A 12-in long, straight, sterile hemostat was used with fluoroscopic guidance to locate the targets for each level blocked. Once located, the skin was marked with an approved surgical skin marker. Once all sites were marked, the skin (epidermis, dermis, and hypodermis), as well as deeper tissues (fat, connective tissue and muscle) were infiltrated with a small amount of a short-acting local anesthetic, loaded on a 10cc syringe with a 25G, 1.5-in  Needle. An appropriate amount of time was allowed for local anesthetics to take effect before proceeding to the next step. Local Anesthetic: Lidocaine 2.0% The unused portion of the local anesthetic was discarded in the proper designated containers. Technical explanation of process:  Radiofrequency Ablation (RFA) L2 Medial Branch Nerve RFA: The target area for the L2 medial branch is at the junction of the postero-lateral aspect of the superior articular process and the superior, posterior, and medial edge of the transverse process of L3. Under fluoroscopic guidance, a  Radiofrequency needle was inserted until contact was made with os over the superior postero-lateral aspect of the pedicular shadow (target area). Sensory and motor testing was conducted to properly adjust the position of the needle. Once satisfactory placement of the needle was achieved, the numbing solution was slowly injected after negative aspiration for blood. 2.0 mL of the nerve block solution was injected without difficulty or complication. After waiting for at least 3 minutes, the ablation was performed. Once completed, the needle was removed intact. L3 Medial Branch Nerve RFA: The target area for the L3 medial branch is at the junction of the postero-lateral aspect of the  superior articular process and the superior, posterior, and medial edge of the transverse process of L4. Under fluoroscopic guidance, a Radiofrequency needle was inserted until contact was made with os over the superior postero-lateral aspect of the pedicular shadow (target area). Sensory and motor testing was conducted to properly adjust the position of the needle. Once satisfactory placement of the needle was achieved, the numbing solution was slowly injected after negative aspiration for blood. 2.0 mL of the nerve block solution was injected without difficulty or complication. After waiting for at least 3 minutes, the ablation was performed. Once completed, the needle was removed intact. L4 Medial Branch Nerve RFA: The target area for the L4 medial branch is at the junction of the postero-lateral aspect of the superior articular process and the superior, posterior, and medial edge of the transverse process of L5. Under fluoroscopic guidance, a Radiofrequency needle was inserted until contact was made with os over the superior postero-lateral aspect of the pedicular shadow (target area). Sensory and motor testing was conducted to properly adjust the position of the needle. Once satisfactory placement of the needle was achieved, the  numbing solution was slowly injected after negative aspiration for blood. 2.0 mL of the nerve block solution was injected without difficulty or complication. After waiting for at least 3 minutes, the ablation was performed. Once completed, the needle was removed intact. L5 Medial Branch Nerve RFA: The target area for the L5 medial branch is at the junction of the postero-lateral aspect of the superior articular process and the superior, posterior, and medial edge of the sacral ala. Under fluoroscopic guidance, a Radiofrequency needle was inserted until contact was made with os over the superior postero-lateral aspect of the pedicular shadow (target area). Sensory and motor testing was conducted to properly adjust the position of the needle. Once satisfactory placement of the needle was achieved, the numbing solution was slowly injected after negative aspiration for blood. 2.0 mL of the nerve block solution was injected without difficulty or complication. After waiting for at least 3 minutes, the ablation was performed. Once completed, the needle was removed intact. S1 Medial Branch Nerve RFA: The target area for the S1 medial branch is at the posterior and inferior 6 o'clock position of the L5-S1 facet joint, as well as the postero-lateral aspect of the S1 posterior foramen. Under fluoroscopic guidance, the Radiofrequency needle was advanced until contact was made with os over the inferior and postero aspect of the sacrum, at the 6 o' clock position under the L5-S1 facet joint, and later at the posterior aspect of the sacrum, lateral to the posterior S1 foramen (Target area). Sensory and motor testing was conducted to properly adjust the position of the needle. Once satisfactory placement of the needle was achieved, the numbing solution was slowly injected after negative aspiration for blood. 2.0 mL of the nerve block solution was injected without difficulty or complication. After waiting for at least 3 minutes,  the ablation was performed. Once completed, the needle was removed intact. Radiofrequency lesioning (ablation):  Radiofrequency Generator: NeuroTherm [id number] Sensory Stimulation Parameters: 50 Hz was used to locate & identify the nerve, making sure that the needle was positioned such that there was no sensory stimulation below 0.3 V or above 0.7 V. Motor Stimulation Parameters: 2 Hz was used to evaluate the motor component. Care was taken not to lesion any nerves that demonstrated motor stimulation of the lower extremities at an output of less than 2.5 times that of the sensory threshold, or a maximum  of 2.0 V. Lesioning Technique Parameters: Standard Radiofrequency settings. (Not bipolar or pulsed.) Temperature Settings: 80 degrees C Lesioning time: 60 seconds Intra-operative Compliance: Compliant Materials & Medications: Needle(s) (Electrode/Cannula) Type: Teflon-coated, curved tip, Radiofrequency needle(s) Gauge: 22G Length: 10cm Numbing solution: 0.2% PF-Ropivacaine + Triamcinolone (40 mg/mL) diluted to a final concentration of 4 mg of Triamcinolone/mL of Ropivacaine The unused portion of the solution was discarded in the proper designated containers.  Once the entire procedure was completed, the treated area was cleaned, making sure to leave some of the prepping solution back to take advantage of its long term bactericidal properties.  Illustration of the posterior view of the lumbar spine and the posterior neural structures. Laminae of L2 through S1 are labeled. DPRL5, dorsal primary ramus of L5; DPRS1, dorsal primary ramus of S1; DPR3, dorsal primary ramus of L3; FJ, facet (zygapophyseal) joint L3-L4; I, inferior articular process of L4; LB1, lateral branch of dorsal primary ramus of L1; IAB, inferior articular branches from L3 medial branch (supplies L4-L5 facet joint); IBP, intermediate branch plexus; MB3, medial branch of dorsal primary ramus of L3; NR3, third lumbar nerve root; S, superior  articular process of L5; SAB, superior articular branches from L4 (supplies L4-5 facet joint also); TP3, transverse process of L3.  Vitals:   02/12/18 1256 02/12/18 1303 02/12/18 1314 02/12/18 1322  BP: (!) 146/102 (!) 142/80 (!) 176/74 (!) 170/63  Pulse:      Resp: 12 12 16 18   Temp:  (!) 97.2 F (36.2 C)  (!) 96.7 F (35.9 C)  TempSrc:      SpO2: 100% 95% 98% 93%  Weight:      Height:       End Time: 1255 hrs.  Imaging Guidance (Spinal):  Type of Imaging Technique: Fluoroscopy Guidance (Spinal) Indication(s): Assistance in needle guidance and placement for procedures requiring needle placement in or near specific anatomical locations not easily accessible without such assistance. Exposure Time: Please see nurses notes. Contrast: None used. Fluoroscopic Guidance: I was personally present during the use of fluoroscopy. "Tunnel Vision Technique" used to obtain the best possible view of the target area. Parallax error corrected before commencing the procedure. "Direction-depth-direction" technique used to introduce the needle under continuous pulsed fluoroscopy. Once target was reached, antero-posterior, oblique, and lateral fluoroscopic projection used confirm needle placement in all planes. Images permanently stored in EMR. Interpretation: No contrast injected. I personally interpreted the imaging intraoperatively. Adequate needle placement confirmed in multiple planes. Permanent images saved into the patient's record.  Antibiotic Prophylaxis:   Anti-infectives (From admission, onward)   None     Indication(s): None identified  Post-operative Assessment:  Post-procedure Vital Signs:  Pulse Rate: 73 Temp: (!) 96.7 F (35.9 C) Resp: 18 BP: (!) 170/63 SpO2: 93 %  EBL: None  Complications: No immediate post-treatment complications observed by team, or reported by patient.  Note: The patient tolerated the entire procedure well. A repeat set of vitals were taken after the  procedure and the patient was kept under observation following institutional policy, for this type of procedure. Post-procedural neurological assessment was performed, showing return to baseline, prior to discharge. The patient was provided with post-procedure discharge instructions, including a section on how to identify potential problems. Should any problems arise concerning this procedure, the patient was given instructions to immediately contact us, at any time, without hesitation. In any case, we plan to contact the patient by telephone for a follow-up status report regarding this interventional procedure.  Comments:  No additional relevant  information.  Plan of Care   Imaging Orders     DG C-Arm 1-60 Min-No Report  Procedure Orders     Radiofrequency,Lumbar     TRIGGER POINT INJECTION  Medications ordered for procedure: Meds ordered this encounter  Medications  . lidocaine (XYLOCAINE) 2 % (with pres) injection 200 mg  . ropivacaine (PF) 2 mg/mL (0.2%) (NAROPIN) injection 9 mL  . triamcinolone acetonide (KENALOG-40) injection 40 mg  . midazolam (VERSED) 5 MG/5ML injection 1-2 mg    Make sure Flumazenil is available in the pyxis when using this medication. If oversedation occurs, administer 0.2 mg IV over 15 sec. If after 45 sec no response, administer 0.2 mg again over 1 min; may repeat at 1 min intervals; not to exceed 4 doses (1 mg)  . fentaNYL (SUBLIMAZE) injection 25-50 mcg    Make sure Narcan is available in the pyxis when using this medication. In the event of respiratory depression (RR< 8/min): Titrate NARCAN (naloxone) in increments of 0.1 to 0.2 mg IV at 2-3 minute intervals, until desired degree of reversal.  . lactated ringers infusion 1,000 mL  . oxyCODONE-acetaminophen (PERCOCET) 5-325 MG tablet    Sig: Take 1 tablet by mouth every 8 (eight) hours as needed for up to 7 days for severe pain.    Dispense:  21 tablet    Refill:  0    For acute post-operative pain. Not to  be refilled. To last 7 days.  Marland Kitchen triamcinolone acetonide (KENALOG-40) injection 40 mg  . ropivacaine (PF) 2 mg/mL (0.2%) (NAROPIN) injection 9 mL  . ropivacaine (PF) 2 mg/mL (0.2%) (NAROPIN) injection 4 mL   Medications administered: We administered lidocaine, ropivacaine (PF) 2 mg/mL (0.2%), triamcinolone acetonide, fentaNYL, lactated ringers, triamcinolone acetonide, ropivacaine (PF) 2 mg/mL (0.2%), and ropivacaine (PF) 2 mg/mL (0.2%).  See the medical record for exact dosing, route, and time of administration.  New Prescriptions   OXYCODONE-ACETAMINOPHEN (PERCOCET) 5-325 MG TABLET    Take 1 tablet by mouth every 8 (eight) hours as needed for up to 7 days for severe pain.   Disposition: Discharge home  Discharge Date & Time: 02/12/2018; 1326 hrs.   Physician-requested Follow-up: Return for Post-RFA eval in 6 wks, w/ Dionisio David, NP.  Future Appointments  Date Time Provider [place]  03/01/2018  9:00 AM [facility]  03/04/2018 11:00 AM Einar Pheasant, MD [facility]  03/20/2018 10:30 AM Milinda Pointer, MD [facility] None  04/02/2018 10:30 AM Vevelyn Francois, NP [facility] None  04/09/2018  2:30 PM Grayland Ormond, Kathlene November, MD [facility] None   Primary Care Physician: Einar Pheasant, MD Location: [facility] Note by: Gaspar Cola, MD Date: 02/12/2018; Time: 3:26 PM  Disclaimer:  Medicine is not an Chief Strategy Officer. The only guarantee in medicine is that nothing is guaranteed. It is important to note that the decision to proceed with this intervention was based on the information collected from the patient. The Data and conclusions were drawn from the patient's questionnaire, the interview, and the physical examination. Because the information was provided in large part by the patient, it cannot be guaranteed that it has not been purposely or unconsciously manipulated. Every effort has been made to obtain as much relevant data as  possible for this evaluation. It is important to note that the conclusions that lead to this procedure are derived in large part from the available data. Always take into account that the treatment will also be dependent on availability of resources  and existing treatment guidelines, considered by other Pain Management Practitioners as being common knowledge and practice, at the time of the intervention. For Medico-Legal purposes, it is also important to point out that variation in procedural techniques and pharmacological choices are the acceptable norm. The indications, contraindications, technique, and results of the above procedure should only be interpreted and judged by a Board-Certified Interventional Pain Specialist with extensive familiarity and expertise in the same exact procedure and technique.

## 2018-02-12 NOTE — Patient Instructions (Signed)

## 2018-02-12 NOTE — Progress Notes (Signed)
Safety precautions to be maintained throughout the outpatient stay will include: orient to surroundings, keep bed in low position, maintain call bell within reach at all times, provide assistance with transfer out of bed and ambulation.  

## 2018-02-13 ENCOUNTER — Telehealth: Payer: Self-pay | Admitting: *Deleted

## 2018-02-13 NOTE — Telephone Encounter (Signed)
Denies complications post procedure. 

## 2018-02-21 ENCOUNTER — Other Ambulatory Visit: Payer: Self-pay | Admitting: Internal Medicine

## 2018-02-22 ENCOUNTER — Other Ambulatory Visit: Payer: Self-pay | Admitting: Oncology

## 2018-03-01 ENCOUNTER — Other Ambulatory Visit: Payer: Medicare Other

## 2018-03-04 ENCOUNTER — Encounter: Payer: Self-pay | Admitting: Internal Medicine

## 2018-03-04 ENCOUNTER — Ambulatory Visit (INDEPENDENT_AMBULATORY_CARE_PROVIDER_SITE_OTHER): Payer: Medicare Other | Admitting: Internal Medicine

## 2018-03-04 DIAGNOSIS — M8589 Other specified disorders of bone density and structure, multiple sites: Secondary | ICD-10-CM | POA: Diagnosis not present

## 2018-03-04 DIAGNOSIS — E669 Obesity, unspecified: Secondary | ICD-10-CM | POA: Diagnosis not present

## 2018-03-04 DIAGNOSIS — E7800 Pure hypercholesterolemia, unspecified: Secondary | ICD-10-CM | POA: Diagnosis not present

## 2018-03-04 DIAGNOSIS — F329 Major depressive disorder, single episode, unspecified: Secondary | ICD-10-CM | POA: Diagnosis not present

## 2018-03-04 DIAGNOSIS — N182 Chronic kidney disease, stage 2 (mild): Secondary | ICD-10-CM

## 2018-03-04 DIAGNOSIS — I10 Essential (primary) hypertension: Secondary | ICD-10-CM

## 2018-03-04 DIAGNOSIS — C50911 Malignant neoplasm of unspecified site of right female breast: Secondary | ICD-10-CM

## 2018-03-04 DIAGNOSIS — G20 Parkinson's disease: Secondary | ICD-10-CM | POA: Diagnosis not present

## 2018-03-04 DIAGNOSIS — F32A Depression, unspecified: Secondary | ICD-10-CM

## 2018-03-04 LAB — HEPATIC FUNCTION PANEL
ALT: 5 U/L (ref 0–35)
AST: 18 U/L (ref 0–37)
Albumin: 4.3 g/dL (ref 3.5–5.2)
Alkaline Phosphatase: 30 U/L — ABNORMAL LOW (ref 39–117)
BILIRUBIN DIRECT: 0.1 mg/dL (ref 0.0–0.3)
Total Bilirubin: 0.5 mg/dL (ref 0.2–1.2)
Total Protein: 7.4 g/dL (ref 6.0–8.3)

## 2018-03-04 LAB — BASIC METABOLIC PANEL
BUN: 20 mg/dL (ref 6–23)
CALCIUM: 9.7 mg/dL (ref 8.4–10.5)
CO2: 28 mEq/L (ref 19–32)
CREATININE: 1.03 mg/dL (ref 0.40–1.20)
Chloride: 98 mEq/L (ref 96–112)
GFR: 54.75 mL/min — ABNORMAL LOW (ref >60.00)
Glucose, Bld: 86 mg/dL (ref 70–99)
Potassium: 4.6 mEq/L (ref 3.5–5.1)
Sodium: 133 mEq/L — ABNORMAL LOW (ref 135–145)

## 2018-03-04 LAB — LIPID PANEL
CHOL/HDL RATIO: 4
CHOLESTEROL: 179 mg/dL (ref 0–200)
HDL: 50.1 mg/dL (ref >39.00)
LDL Cholesterol: 99 mg/dL (ref 0–99)
NonHDL: 129.07
TRIGLYCERIDES: 151 mg/dL — AB (ref 0.0–149.0)
VLDL: 30.2 mg/dL (ref 0.0–40.0)

## 2018-03-04 LAB — TSH: TSH: 2.12 u[IU]/mL (ref 0.35–4.50)

## 2018-03-04 NOTE — Progress Notes (Signed)
Patient ID: Wanda Martinez, female   DOB: [birth year], 83 y.o.   MRN: [id number]   Subjective:    Patient ID: Wanda Martinez, female    DOB: 1935/06/26, 83 y.o.   MRN: [id number]  HPI  Patient here for a scheduled follow up.  She reports she fell last week.  No significant residual problems after the fall.  Did not hit her head.  No chest pain.  No sob.  No acid reflux.  No abdominal pain reported.  Bowels moving.  Overall she feels things are stable.     Past Medical History:  Diagnosis Date  . Anemia   . Arm pain 07/26/2015  . Arthritis   . Arthritis, degenerative 03/26/2014  . Back pain 11/01/2013  . Breast cancer ([place])    Masectomy - left - 1986   . Breast cancer [facility])    Mastectomy-right -2014  . CHEST PAIN 04/29/2010   Qualifier: Diagnosis of  By: Wynetta Emery RN, Doroteo Bradford    . Chronic cystitis   . Cystocele 02/22/2013  . Cystocele, midline 08/19/2013  . Degeneration of intervertebral disc of lumbosacral region 03/26/2014  . DYSPNEA 04/29/2010   Qualifier: Diagnosis of  By: Wynetta Emery RN, Doroteo Bradford    . Enthesopathy of hip 03/26/2014  . GERD (gastroesophageal reflux disease)   . Hiatal hernia   . HTN (hypertension)   . LBP (low back pain) 03/26/2014  . Neck pain 11/01/2013  . Parkinson disease ([place])   . Sinusitis 02/07/2015  . Skin lesions 07/12/2014  . Urinary incontinence    mixed    Past Surgical History:  Procedure Laterality Date  . ABDOMINAL HYSTERECTOMY    . BREAST BIOPSY  2013  . BREAST ENHANCEMENT SURGERY  1987  . BREAST IMPLANT REMOVAL    . BREAST IMPLANT REMOVAL Right 08/29/2012  . BREAST SURGERY  1986   s/p left mastectomy  . COLONOSCOPY WITH PROPOFOL N/A 09/13/2016   Procedure: COLONOSCOPY WITH PROPOFOL;  Surgeon: Manya Silvas, MD;  Location: [facility] ENDOSCOPY;  Service: Endoscopy;  Laterality: N/A;  . MASTECTOMY  08/2012   right  . TONSILLECTOMY AND ADENOIDECTOMY  79  . VESICOVAGINAL FISTULA CLOSURE W/ TAH  1983   Family History  Problem Relation Age of Onset  .  Diabetes Father   . Stroke Father   . Colon polyps Father   . Stroke Mother   . Parkinson's disease Mother    Social History   Socioeconomic History  . Marital status: Married    Spouse name: Not on file  . Number of children: 3  . Years of education: Not on file  . Highest education level: Not on file  Occupational History  . Not on file  Social Needs  . Financial resource strain: Not on file  . Food insecurity:    Worry: Not on file    Inability: Not on file  . Transportation needs:    Medical: Not on file    Non-medical: Not on file  Tobacco Use  . Smoking status: Never Smoker  . Smokeless tobacco: Never Used  . Tobacco comment: tobacco use - no  Substance and Sexual Activity  . Alcohol use: No    Alcohol/week: 0.0 oz  . Drug use: No  . Sexual activity: Never  Lifestyle  . Physical activity:    Days per week: Not on file    Minutes per session: Not on file  . Stress: Not on file  Relationships  . Social connections:    Talks  on phone: Not on file    Gets together: Not on file    Attends religious service: Not on file    Active member of club or organization: Not on file    Attends meetings of clubs or organizations: Not on file    Relationship status: Not on file  Other Topics Concern  . Not on file  Social History Narrative  . Not on file    Outpatient Encounter Medications as of 03/04/2018  Medication Sig  . amLODipine (NORVASC) 2.5 MG tablet TAKE 1 TABLET BY MOUTH ONCE DAILY.  Marland Kitchen aspirin 81 MG EC tablet Take 81 mg by mouth daily as needed.    . Calcium Carbonate (CALCIUM 600) 1500 MG TABS Take 1 tablet by mouth daily.    . carbidopa-levodopa (SINEMET IR) 25-100 MG tablet Take by mouth.  . citalopram (CELEXA) 20 MG tablet TAKE 1 & 1/2 TABLETS BY MOUTH ONCE DAILY  . cyclobenzaprine (FLEXERIL) 10 MG tablet Take 1 tablet (10 mg total) by mouth at bedtime.  . gabapentin (NEURONTIN) 300 MG capsule Take 1 capsule (300 mg total) by mouth 2 (two) times daily.    Marland Kitchen GLUCOSAMINE SULFATE PO Take by mouth daily.   . hydrochlorothiazide (HYDRODIURIL) 25 MG tablet TAKE 1 TABLET BY MOUTH ONCE DAILY. (PATIENT TAKES 1/2 ONCE DAILY AND EXTRA 1/2 IF NEEDED)  . HYDROcodone-acetaminophen (NORCO/VICODIN) 5-325 MG tablet Take 1 tablet by mouth every 8 (eight) hours as needed for severe pain.  Marland Kitchen HYDROcodone-acetaminophen (NORCO/VICODIN) 5-325 MG tablet Take 1 tablet by mouth every 8 (eight) hours as needed for severe pain.  Marland Kitchen HYDROcodone-acetaminophen (NORCO/VICODIN) 5-325 MG tablet Take 1 tablet by mouth every 8 (eight) hours as needed for severe pain.  Marland Kitchen letrozole (FEMARA) 2.5 MG tablet TAKE 1 TABLET BY MOUTH ONCE DAILY.  Marland Kitchen losartan (COZAAR) 100 MG tablet TAKE 1 TABLET BY MOUTH ONCE DAILY.  Marland Kitchen lubiprostone (AMITIZA) 24 MCG capsule TAKE 1 CAPSULE BY MOUTH ONCE DAILY WITH BREAKFAST.  . magnesium oxide (MAGNESIUM-OXIDE) 400 (241.3 Mg) MG tablet Take 1 tablet (400 mg total) by mouth daily.  . meclizine (ANTIVERT) 12.5 MG tablet Take 1 tablet (12.5 mg total) by mouth 2 (two) times daily as needed for dizziness.  . meloxicam (MOBIC) 7.5 MG tablet Take 1 tablet (7.5 mg total) by mouth daily.  . metoprolol succinate (TOPROL-XL) 25 MG 24 hr tablet TAKE 1 TABLET BY MOUTH TWICE DAILY  . Multiple Vitamin (MULTIVITAMIN) tablet Take 1 tablet by mouth daily.  . Omega-3 1000 MG CAPS Take by mouth daily.   Marland Kitchen omeprazole (PRILOSEC) 20 MG capsule TAKE 1 CAPSULE BY MOUTH TWICE DAILY FOR REFLUX.  . pantoprazole (PROTONIX) 40 MG tablet TAKE 1 TABLET BY MOUTH ONCE DAILY.  . pravastatin (PRAVACHOL) 20 MG tablet TAKE 1 TABLET BY MOUTH ONCE DAILY.   No facility-administered encounter medications on file as of 03/04/2018.     Review of Systems  Constitutional: Negative for appetite change and unexpected weight change.  HENT: Negative for congestion and sinus pressure.   Respiratory: Negative for cough, chest tightness and shortness of breath.   Cardiovascular: Negative for chest pain,  palpitations and leg swelling.  Gastrointestinal: Negative for abdominal pain, diarrhea, nausea and vomiting.  Genitourinary: Negative for difficulty urinating and dysuria.  Musculoskeletal: Negative for myalgias and neck pain.  Skin: Negative for color change and rash.  Neurological: Negative for dizziness, light-headedness and headaches.  Psychiatric/Behavioral: Negative for agitation and dysphoric mood.       Objective:  Blood pressure rechecked by me:  122/84  Physical Exam  Constitutional: She appears well-developed and well-nourished. No distress.  HENT:  Nose: Nose normal.  Mouth/Throat: Oropharynx is clear and moist.  Neck: Neck supple. No thyromegaly present.  Cardiovascular: Normal rate and regular rhythm.  Pulmonary/Chest: Breath sounds normal. No respiratory distress. She has no wheezes.  Abdominal: Soft. Bowel sounds are normal. There is no tenderness.  Musculoskeletal: She exhibits no edema or tenderness.  Lymphadenopathy:    She has no cervical adenopathy.  Skin: No rash noted. No erythema.  Psychiatric: She has a normal mood and affect. Her behavior is normal.    BP 132/80 (BP Location: Left Arm, Patient Position: Sitting, Cuff Size: Normal)   Pulse 67   Temp 98.6 F (37 C) (Oral)   Resp 18   Wt 181 lb 3.2 oz (82.2 kg)   LMP 12/18/1981   SpO2 97%   BMI 31.10 kg/m  Wt Readings from Last 3 Encounters:  03/04/18 181 lb 3.2 oz (82.2 kg)  02/12/18 180 lb (81.6 kg)  01/02/18 180 lb (81.6 kg)     Lab Results  Component Value Date   WBC 5.5 09/20/2017   HGB 12.0 09/20/2017   HCT 35.8 (L) 09/20/2017   PLT 293.0 09/20/2017   GLUCOSE 86 03/04/2018   CHOL 179 03/04/2018   TRIG 151.0 (H) 03/04/2018   HDL 50.10 03/04/2018   LDLDIRECT 100.0 10/18/2016   LDLCALC 99 03/04/2018   ALT 5 03/04/2018   AST 18 03/04/2018   NA 133 (L) 03/04/2018   K 4.6 03/04/2018   CL 98 03/04/2018   CREATININE 1.03 03/04/2018   BUN 20 03/04/2018   CO2 28 03/04/2018    TSH 2.12 03/04/2018    Dg C-arm 1-60 Min-no Report  Result Date: 02/12/2018 Fluoroscopy was utilized by the requesting physician.  No radiographic interpretation.       Assessment & Plan:   Problem List Items Addressed This Visit    Chronic kidney disease (CKD), stage II (mild)    Creatinine has been stable.  On meloxicam.  Follow metabolic panel.        Clinical depression    On citalopram and doing well.  Follow.        Essential hypertension    Blood pressure under good control.  Continue same medication regimen.  Follow pressures.  Follow metabolic panel.        Hypercholesterolemia    On pravastatin.  Low cholesterol diet and exercise.  Follow lipid panel and liver function tests.        Malignant neoplasm of right female breast ([place])    On femara.  Followed by oncology.        Obesity (BMI 30-39.9)    Discussed diet and exercise.  Follow.        Osteopenia    Have discussed bone density and discussed prolia.  She discussed with oncology.  Ok to proceed.  Follow.        Parkinsons ([place])    Followed by neurology.  Stable.           Einar Pheasant, MD

## 2018-03-06 ENCOUNTER — Encounter: Payer: Self-pay | Admitting: *Deleted

## 2018-03-07 ENCOUNTER — Encounter: Payer: Self-pay | Admitting: Internal Medicine

## 2018-03-07 NOTE — Assessment & Plan Note (Signed)
Creatinine has been stable.  On meloxicam.  Follow metabolic panel.

## 2018-03-07 NOTE — Assessment & Plan Note (Signed)
Have discussed bone density and discussed prolia.  She discussed with oncology.  Ok to proceed.  Follow.

## 2018-03-07 NOTE — Assessment & Plan Note (Signed)
On femara.  Followed by oncology.  

## 2018-03-07 NOTE — Assessment & Plan Note (Signed)
Blood pressure under good control.  Continue same medication regimen.  Follow pressures.  Follow metabolic panel.   

## 2018-03-07 NOTE — Assessment & Plan Note (Signed)
On pravastatin.  Low cholesterol diet and exercise.  Follow lipid panel and liver function tests.   

## 2018-03-07 NOTE — Assessment & Plan Note (Signed)
On citalopram and doing well.  Follow.

## 2018-03-07 NOTE — Assessment & Plan Note (Signed)
Followed by neurology.  Stable  

## 2018-03-07 NOTE — Assessment & Plan Note (Signed)
Discussed diet and exercise.  Follow.  

## 2018-03-19 ENCOUNTER — Telehealth: Payer: Self-pay | Admitting: Internal Medicine

## 2018-03-19 NOTE — Telephone Encounter (Signed)
Insurance verification for Prolia filed on [employer] Portal. 

## 2018-03-20 ENCOUNTER — Ambulatory Visit: Payer: Medicare Other | Admitting: Pain Medicine

## 2018-03-20 ENCOUNTER — Encounter: Payer: Self-pay | Admitting: Nurse Practitioner

## 2018-03-20 ENCOUNTER — Ambulatory Visit: Payer: Medicare Other | Attending: Pain Medicine | Admitting: Nurse Practitioner

## 2018-03-20 VITALS — BP 133/81 | HR 67 | Temp 98.1°F | Resp 16 | Ht 64.0 in | Wt 180.0 lb

## 2018-03-20 DIAGNOSIS — Z888 Allergy status to other drugs, medicaments and biological substances status: Secondary | ICD-10-CM | POA: Insufficient documentation

## 2018-03-20 DIAGNOSIS — E7800 Pure hypercholesterolemia, unspecified: Secondary | ICD-10-CM | POA: Insufficient documentation

## 2018-03-20 DIAGNOSIS — I10 Essential (primary) hypertension: Secondary | ICD-10-CM | POA: Insufficient documentation

## 2018-03-20 DIAGNOSIS — G894 Chronic pain syndrome: Secondary | ICD-10-CM | POA: Insufficient documentation

## 2018-03-20 DIAGNOSIS — Z8371 Family history of colonic polyps: Secondary | ICD-10-CM | POA: Insufficient documentation

## 2018-03-20 DIAGNOSIS — M48061 Spinal stenosis, lumbar region without neurogenic claudication: Secondary | ICD-10-CM | POA: Insufficient documentation

## 2018-03-20 DIAGNOSIS — G8929 Other chronic pain: Secondary | ICD-10-CM

## 2018-03-20 DIAGNOSIS — Z791 Long term (current) use of non-steroidal anti-inflammatories (NSAID): Secondary | ICD-10-CM | POA: Diagnosis not present

## 2018-03-20 DIAGNOSIS — E669 Obesity, unspecified: Secondary | ICD-10-CM | POA: Insufficient documentation

## 2018-03-20 DIAGNOSIS — Z9010 Acquired absence of unspecified breast and nipple: Secondary | ICD-10-CM | POA: Insufficient documentation

## 2018-03-20 DIAGNOSIS — G20 Parkinson's disease: Secondary | ICD-10-CM | POA: Diagnosis not present

## 2018-03-20 DIAGNOSIS — M159 Polyosteoarthritis, unspecified: Secondary | ICD-10-CM

## 2018-03-20 DIAGNOSIS — N182 Chronic kidney disease, stage 2 (mild): Secondary | ICD-10-CM | POA: Insufficient documentation

## 2018-03-20 DIAGNOSIS — Z79891 Long term (current) use of opiate analgesic: Secondary | ICD-10-CM | POA: Diagnosis not present

## 2018-03-20 DIAGNOSIS — I129 Hypertensive chronic kidney disease with stage 1 through stage 4 chronic kidney disease, or unspecified chronic kidney disease: Secondary | ICD-10-CM | POA: Diagnosis not present

## 2018-03-20 DIAGNOSIS — Z833 Family history of diabetes mellitus: Secondary | ICD-10-CM | POA: Diagnosis not present

## 2018-03-20 DIAGNOSIS — M545 Low back pain: Secondary | ICD-10-CM

## 2018-03-20 DIAGNOSIS — Z79899 Other long term (current) drug therapy: Secondary | ICD-10-CM | POA: Diagnosis not present

## 2018-03-20 DIAGNOSIS — Z853 Personal history of malignant neoplasm of breast: Secondary | ICD-10-CM | POA: Diagnosis not present

## 2018-03-20 DIAGNOSIS — M7918 Myalgia, other site: Secondary | ICD-10-CM | POA: Diagnosis not present

## 2018-03-20 DIAGNOSIS — M150 Primary generalized (osteo)arthritis: Secondary | ICD-10-CM | POA: Diagnosis not present

## 2018-03-20 DIAGNOSIS — M47816 Spondylosis without myelopathy or radiculopathy, lumbar region: Secondary | ICD-10-CM | POA: Diagnosis not present

## 2018-03-20 DIAGNOSIS — K219 Gastro-esophageal reflux disease without esophagitis: Secondary | ICD-10-CM | POA: Diagnosis not present

## 2018-03-20 DIAGNOSIS — Z820 Family history of epilepsy and other diseases of the nervous system: Secondary | ICD-10-CM | POA: Insufficient documentation

## 2018-03-20 DIAGNOSIS — Z90710 Acquired absence of both cervix and uterus: Secondary | ICD-10-CM | POA: Insufficient documentation

## 2018-03-20 DIAGNOSIS — Z9889 Other specified postprocedural states: Secondary | ICD-10-CM | POA: Diagnosis not present

## 2018-03-20 DIAGNOSIS — M792 Neuralgia and neuritis, unspecified: Secondary | ICD-10-CM | POA: Insufficient documentation

## 2018-03-20 DIAGNOSIS — Z823 Family history of stroke: Secondary | ICD-10-CM | POA: Insufficient documentation

## 2018-03-20 DIAGNOSIS — Z882 Allergy status to sulfonamides status: Secondary | ICD-10-CM | POA: Insufficient documentation

## 2018-03-20 MED ORDER — MELOXICAM 7.5 MG PO TABS: 7.5000 mg | ORAL_TABLET | Freq: Every day | ORAL | 0 refills | Status: DC

## 2018-03-20 MED ORDER — HYDROCODONE-ACETAMINOPHEN 5-325 MG PO TABS: 1.0000 | ORAL_TABLET | Freq: Three times a day (TID) | ORAL | 0 refills | Status: DC | PRN

## 2018-03-20 MED ORDER — GABAPENTIN 300 MG PO CAPS: 300.0000 mg | ORAL_CAPSULE | Freq: Two times a day (BID) | ORAL | 0 refills | Status: DC

## 2018-03-20 MED ORDER — MAGNESIUM OXIDE 400 (241.3 MG) MG PO TABS: 1.0000 | ORAL_TABLET | Freq: Every day | ORAL | 0 refills | Status: DC

## 2018-03-20 MED ORDER — CYCLOBENZAPRINE HCL 10 MG PO TABS: 10.0000 mg | ORAL_TABLET | Freq: Every day | ORAL | 0 refills | Status: DC

## 2018-03-20 NOTE — Progress Notes (Signed)
Nursing Pain Medication Assessment:  Safety precautions to be maintained throughout the outpatient stay will include: orient to surroundings, keep bed in low position, maintain call bell within reach at all times, provide assistance with transfer out of bed and ambulation.  Medication Inspection Compliance: Pill count conducted under aseptic conditions, in front of the patient. Neither the pills nor the bottle was removed from the patient's sight at any time. Once count was completed pills were immediately returned to the patient in their original bottle.  Medication: Hydrocodone/APAP Pill/Patch Count: 75.5 of 90 pills remain Pill/Patch Appearance: Markings consistent with prescribed medication Bottle Appearance: Standard pharmacy container. Clearly labeled. Filled Date: 03 / 28 / 2019 Last Medication intake:  Yesterday

## 2018-03-20 NOTE — Patient Instructions (Addendum)
____________________________________________________________________________________________  Medication Rules  Applies to: All patients receiving prescriptions (written or electronic).  Pharmacy of record: Pharmacy where electronic prescriptions will be sent. If written prescriptions are taken to a different pharmacy, please inform the nursing staff. The pharmacy listed in the electronic medical record should be the one where you would like electronic prescriptions to be sent.  Prescription refills: Only during scheduled appointments. Applies to both, written and electronic prescriptions.  NOTE: The following applies primarily to controlled substances (Opioid* Pain Medications).   Patient's responsibilities: 1. Pain Pills: Bring all pain pills to every appointment (except for procedure appointments). 2. Pill Bottles: Bring pills in original pharmacy bottle. Always bring newest bottle. Bring bottle, even if empty. 3. Medication refills: You are responsible for knowing and keeping track of what medications you need refilled. The day before your appointment, write a list of all prescriptions that need to be refilled. Bring that list to your appointment and give it to the admitting nurse. Prescriptions will be written only during appointments. If you forget a medication, it will not be "Called in", "Faxed", or "electronically sent". You will need to get another appointment to get these prescribed. 4. Prescription Accuracy: You are responsible for carefully inspecting your prescriptions before leaving our office. Have the discharge nurse carefully go over each prescription with you, before taking them home. Make sure that your name is accurately spelled, that your address is correct. Check the name and dose of your medication to make sure it is accurate. Check the number of pills, and the written instructions to make sure they are clear and accurate. Make sure that you are given enough medication to last  until your next medication refill appointment. 5. Taking Medication: Take medication as prescribed. Never take more pills than instructed. Never take medication more frequently than prescribed. Taking less pills or less frequently is permitted and encouraged, when it comes to controlled substances (written prescriptions).  6. Inform other Doctors: Always inform, all of your healthcare providers, of all the medications you take. 7. Pain Medication from other Providers: You are not allowed to accept any additional pain medication from any other Doctor or Healthcare provider. There are two exceptions to this rule. (see below) In the event that you require additional pain medication, you are responsible for notifying us, as stated below. 8. Medication Agreement: You are responsible for carefully reading and following our Medication Agreement. This must be signed before receiving any prescriptions from our practice. Safely store a copy of your signed Agreement. Violations to the Agreement will result in no further prescriptions. (Additional copies of our Medication Agreement are available upon request.) 9. Laws, Rules, & Regulations: All patients are expected to follow all Federal and State Laws, Statutes, Rules, & Regulations. Ignorance of the Laws does not constitute a valid excuse. The use of any illegal substances is prohibited. 10. Adopted CDC guidelines & recommendations: Target dosing levels will be at or below 60 MME/day. Use of benzodiazepines** is not recommended.  Exceptions: There are only two exceptions to the rule of not receiving pain medications from other Healthcare Providers. 1. Exception #1 (Emergencies): In the event of an emergency (i.e.: accident requiring emergency care), you are allowed to receive additional pain medication. However, you are responsible for: As soon as you are able, call our office [phone], at any time of the day or night, and leave a message stating your name, the  date and nature of the emergency, and the name and dose of the medication   prescribed. In the event that your call is answered by a member of our staff, make sure to document and save the date, time, and the name of the person that took your information.  2. Exception #2 (Planned Surgery): In the event that you are scheduled by another doctor or dentist to have any type of surgery or procedure, you are allowed (for a period no longer than 30 days), to receive additional pain medication, for the acute post-op pain. However, in this case, you are responsible for picking up a copy of our "Post-op Pain Management for Surgeons" handout, and giving it to your surgeon or dentist. This document is available at our office, and does not require an appointment to obtain it. Simply go to our office during business hours (Monday-Thursday from 8:00 AM to 4:00 PM) (Friday 8:00 AM to 12:00 Noon) or if you have a scheduled appointment with us, prior to your surgery, and ask for it by name. In addition, you will need to provide us with your name, name of your surgeon, type of surgery, and date of procedure or surgery.  *Opioid medications include: morphine, codeine, oxycodone, oxymorphone, hydrocodone, hydromorphone, meperidine, tramadol, tapentadol, buprenorphine, fentanyl, methadone. **Benzodiazepine medications include: diazepam (Valium), alprazolam (Xanax), clonazepam (Klonopine), lorazepam (Ativan), clorazepate (Tranxene), chlordiazepoxide (Librium), estazolam (Prosom), oxazepam (Serax), temazepam (Restoril), triazolam (Halcion) (Last updated: 02/14/2018) ____________________________________________________________________________________________   ____________________________________________________________________________________________  General Risks and Possible Complications  Patient Responsibilities: It is important that you read this as it is part of your informed consent. It is our duty to inform you of the  risks and possible complications associated with treatments offered to you. It is your responsibility as a patient to read this and to ask questions about anything that is not clear or that you believe was not covered in this document.  Patient's Rights: You have the right to refuse treatment. You also have the right to change your mind, even after initially having agreed to have the treatment done. However, under this last option, if you wait until the last second to change your mind, you may be charged for the materials used up to that point.  Introduction: Medicine is not an exact science. Everything in Medicine, including the lack of treatment(s), carries the potential for danger, harm, or loss (which is by definition: Risk). In Medicine, a complication is a secondary problem, condition, or disease that can aggravate an already existing one. All treatments carry the risk of possible complications. The fact that a side effects or complications occurs, does not imply that the treatment was conducted incorrectly. It must be clearly understood that these can happen even when everything is done following the highest safety standards.  No treatment: You can choose not to proceed with the proposed treatment alternative. The "PRO(s)" would include: avoiding the risk of complications associated with the therapy. The "CON(s)" would include: not getting any of the treatment benefits. These benefits fall under one of three categories: diagnostic; therapeutic; and/or palliative. Diagnostic benefits include: getting information which can ultimately lead to improvement of the disease or symptom(s). Therapeutic benefits are those associated with the successful treatment of the disease. Finally, palliative benefits are those related to the decrease of the primary symptoms, without necessarily curing the condition (example: decreasing the pain from a flare-up of a chronic condition, such as incurable terminal  cancer).  General Risks and Complications: These are associated to most interventional treatments. They can occur alone, or in combination. They fall under one of the following six (6) categories:   no benefit or worsening of symptoms; bleeding; infection; nerve damage; allergic reactions; and/or death. 1. No benefits or worsening of symptoms: In Medicine there are no guarantees, only probabilities. No healthcare provider can ever guarantee that a medical treatment will work, they can only state the probability that it may. Furthermore, there is always the possibility that the condition may worsen, either directly, or indirectly, as a consequence of the treatment. 2. Bleeding: This is more common if the patient is taking a blood thinner, either prescription or over the counter (example: Goody Powders, Fish oil, Aspirin, Garlic, etc.), or if suffering a condition associated with impaired coagulation (example: Hemophilia, cirrhosis of the liver, low platelet counts, etc.). However, even if you do not have one on these, it can still happen. If you have any of these conditions, or take one of these drugs, make sure to notify your treating physician. 3. Infection: This is more common in patients with a compromised immune system, either due to disease (example: diabetes, cancer, human immunodeficiency virus [HIV], etc.), or due to medications or treatments (example: therapies used to treat cancer and rheumatological diseases). However, even if you do not have one on these, it can still happen. If you have any of these conditions, or take one of these drugs, make sure to notify your treating physician. 4. Nerve Damage: This is more common when the treatment is an invasive one, but it can also happen with the use of medications, such as those used in the treatment of cancer. The damage can occur to small secondary nerves, or to large primary ones, such as those in the spinal cord and brain. This damage may be temporary  or permanent and it may lead to impairments that can range from temporary numbness to permanent paralysis and/or brain death. 5. Allergic Reactions: Any time a substance or material comes in contact with our body, there is the possibility of an allergic reaction. These can range from a mild skin rash (contact dermatitis) to a severe systemic reaction (anaphylactic reaction), which can result in death. 6. Death: In general, any medical intervention can result in death, most of the time due to an unforeseen complication. ____________________________________________________________________________________________  Facet Blocks Patient Information  Description: The facets are joints in the spine between the vertebrae.  Like any joints in the body, facets can become irritated and painful.  Arthritis can also effect the facets.  By injecting steroids and local anesthetic in and around these joints, we can temporarily block the nerve supply to them.  Steroids act directly on irritated nerves and tissues to reduce selling and inflammation which often leads to decreased pain.  Facet blocks may be done anywhere along the spine from the neck to the low back depending upon the location of your pain.   After numbing the skin with local anesthetic (like Novocaine), a small needle is passed onto the facet joints under x-ray guidance.  You may experience a sensation of pressure while this is being done.  The entire block usually lasts about 15-25 minutes.   Conditions which may be treated by facet blocks:   Low back/buttock pain  Neck/shoulder pain  Certain types of headaches  Preparation for the injection:  1. Do not eat any solid food or dairy products within 8 hours of your appointment. 2. You may drink clear liquid up to 3 hours before appointment.  Clear liquids include water, black coffee, juice or soda.  No milk or cream please. 3. You may take your regular medication,  including pain medications, with  a sip of water before your appointment.  Diabetics should hold regular insulin (if taken separately) and take 1/2 normal NPH dose the morning of the procedure.  Carry some sugar containing items with you to your appointment. 4. A driver must accompany you and be prepared to drive you home after your procedure. 5. Bring all your current medications with you. 6. An IV may be inserted and sedation may be given at the discretion of the physician. 7. A blood pressure cuff, EKG and other monitors will often be applied during the procedure.  Some patients may need to have extra oxygen administered for a short period. 8. You will be asked to provide medical information, including your allergies and medications, prior to the procedure.  We must know immediately if you are taking blood thinners (like Coumadin/Warfarin) or if you are allergic to IV iodine contrast (dye).  We must know if you could possible be pregnant.  Possible side-effects:   Bleeding from needle site  Infection (rare, may require surgery)  Nerve injury (rare)  Numbness & tingling (temporary)  Difficulty urinating (rare, temporary)  Spinal headache (a headache worse with upright posture)  Light-headedness (temporary)  Pain at injection site (serveral days)  Decreased blood pressure (rare, temporary)  Weakness in arm/leg (temporary)  Pressure sensation in back/neck (temporary)   Call if you experience:   Fever/chills associated with headache or increased back/neck pain  Headache worsened by an upright position  New onset, weakness or numbness of an extremity below the injection site  Hives or difficulty breathing (go to the emergency room)  Inflammation or drainage at the injection site(s)  Severe back/neck pain greater than usual  New symptoms which are concerning to you  Please note:  Although the local anesthetic injected can often make your back or neck feel good for several hours after the injection,  the pain will likely return. It takes 3-7 days for steroids to work.  You may not notice any pain relief for at least one week.  If effective, we will often do a series of 2-3 injections spaced 3-6 weeks apart to maximally decrease your pain.  After the initial series, you may be a candidate for a more permanent nerve block of the facets.  If you have any questions, please call #[phone] [facility]

## 2018-03-20 NOTE — Progress Notes (Signed)
Patient's Name: Wanda Martinez  MRN: [id number]  Referring Provider: Einar Pheasant, MD  DOB: 12-01-1935  PCP: Einar Pheasant, MD  DOS: 03/20/2018  Note by: Vevelyn Francois NP  Service setting: Ambulatory outpatient  Specialty: Interventional Pain Management  Location: [facility] (AMB) Pain Management Facility    Patient type: Established    Primary Reason(s) for Visit: Encounter for prescription drug management & post-procedure evaluation of chronic illness with mild to moderate exacerbation(Level of risk: moderate) CC: Back Pain (lower right is worse )  HPI  Wanda Martinez is a 83 y.o. year old, female patient, who comes today for a post-procedure evaluation and medication management. She has Hypercholesterolemia; Essential hypertension; Frequent UTI; Primary cancer of upper inner quadrant of right female breast ([place]); Tremor; Toenail fungus; Headache; Obesity (BMI 30-39.9); Dysuria; Sinusitis; Parkinsons ([place]); Fatigue; Chronic kidney disease (CKD), stage II (mild); Clinical depression; Incomplete bladder emptying; Mixed incontinence; HLD (hyperlipidemia); Bladder infection, chronic; Parkinson's disease ([place]); Pure hypercholesterolemia; Hernia, rectovaginal; Long term current use of opiate analgesic; Long term prescription opiate use; Opiate use; Encounter for therapeutic drug level monitoring; Encounter for chronic pain management; Chronic low back pain (Primary Source of Pain) (Bilateral) (R>L); Lumbar spondylosis; Chronic hip pain; Chronic neck pain; Cervical spondylosis; Chronic cervical radicular pain (Right); Chronic upper extremity pain (Right); Osteoarthritis; Diffuse myofascial pain syndrome; Neurogenic pain; Chronic upper back pain (Right); Myofascial pain syndrome (Right) (cervicothoracic); Vaginitis; Lumbar facet syndrome (Bilateral) (R>L); Malignant neoplasm of right female breast ([place]); Chronic constipation; Cervical facet hypertrophy; Cervical facet syndrome ([place]); Chronic shoulder pain  (Right); Chronic pain syndrome; Chronic sacroiliac joint pain (Left); Chronic sacroiliac joint pain (Right); Lumbosacral foraminal stenosis (L5-S1) (Right); Lumbar spinal stenosis (with neurogenic claudication) (L3-4); Chronic lower extremity pain (Secondary Area of Pain) (Right); Chronic lumbar radicular pain (S1) (Right); Osteopenia; Trochanteric bursitis of both hips; Spondylosis without myelopathy or radiculopathy, lumbosacral region; and Trigger point with back pain (Right) on their problem list. Her primarily concern today is the Back Pain (lower right is worse )  Pain Assessment: Location: Lower, Left, Right Back Radiating: across the buttocks and down both legs  Onset: More than a month ago Duration: Chronic pain Quality: Discomfort, Constant Severity: 4 /10 (self-reported pain score)  Note: Reported level is compatible with observation.                          Effect on ADL: limiting as to how much activity she can do.  Timing: Constant Modifying factors: decreasing activity keeps the pain at bay.  pain patch.  positioning with pillows, heating pad  Wanda Martinez was last seen on 01/02/2018 for a procedure. During today's appointment we reviewed Wanda Martinez's post-procedure results, as well as her outpatient medication regimen. She admits that the right side of her back is started to bother her some. She did have trigger point injections on 02/12/18. She admits that it was effective short term. She admits that she do some increased cooking and standing this past weekend. She would like to something else done but does not feel like she needs at RFA of the right at this time. She would like to be able to cook a meal for her family on Easter and just enjoy this time in less pain.   Further details on both, my assessment(s), as well as the proposed treatment plan, please see below.  Controlled Substance Pharmacotherapy Assessment REMS (Risk Evaluation and Mitigation Strategy)   Analgesic:Hydrocodone/APAP 5/325 one tablet every 8 hours (15  mg/day) MME/day:15 mg/day.    [name], RN  03/20/2018 10:38 AM  Sign at close encounter Nursing Pain Medication Assessment:  Safety precautions to be maintained throughout the outpatient stay will include: orient to surroundings, keep bed in low position, maintain call bell within reach at all times, provide assistance with transfer out of bed and ambulation.  Medication Inspection Compliance: Pill count conducted under aseptic conditions, in front of the patient. Neither the pills nor the bottle was removed from the patient's sight at any time. Once count was completed pills were immediately returned to the patient in their original bottle.  Medication: Hydrocodone/APAP Pill/Patch Count: 75.5 of 90 pills remain Pill/Patch Appearance: Markings consistent with prescribed medication Bottle Appearance: Standard pharmacy container. Clearly labeled. Filled Date: 03 / 28 / 2019 Last Medication intake:  Yesterday   Pharmacokinetics: Liberation and absorption (onset of action): WNL Distribution (time to peak effect): WNL Metabolism and excretion (duration of action): WNL         Pharmacodynamics: Desired effects: Analgesia: Wanda Martinez reports >50% benefit. Functional ability: Patient reports that medication allows her to accomplish basic ADLs Clinically meaningful improvement in function (CMIF): Sustained CMIF goals met Perceived effectiveness: Described as relatively effective, allowing for increase in activities of daily living (ADL) Undesirable effects: Side-effects or Adverse reactions: None reported Monitoring: [place] PMP: Online review of the past 75-monthperiod conducted. Compliant with practice rules and regulations Last UDS on record: Summary  Date Value Ref Range Status  07/02/2017 FINAL  Final    Comment:    ==================================================================== TOXASSURE SELECT 13  (MW) ==================================================================== Test                             Result       Flag       Units Drug Present and Declared for Prescription Verification   Hydrocodone                    597          EXPECTED   ng/mg creat   Dihydrocodeine                 245          EXPECTED   ng/mg creat   Norhydrocodone                 1486         EXPECTED   ng/mg creat    Sources of hydrocodone include scheduled prescription    medications. Dihydrocodeine and norhydrocodone are expected    metabolites of hydrocodone. Dihydrocodeine is also available as a    scheduled prescription medication. ==================================================================== Test                      Result    Flag   Units      Ref Range   Creatinine              119              mg/dL      >=20 ==================================================================== Declared Medications:  The flagging and interpretation on this report are based on the  following declared medications.  Unexpected results may arise from  inaccuracies in the declared medications.  **Note: The testing scope of this panel includes these medications:  Hydrocodone (Hydrocodone-Acetaminophen)  **Note: The testing scope of this panel does not include following  reported medications:  Acetaminophen (Hydrocodone-Acetaminophen)  Amlodipine Besylate  Aspirin  Calcium Carbonate  Carbidopa (Carbidopa/Levodopa)  Citalopram  Cyclobenzaprine  Gabapentin  Glucosamine  Hydrochlorothiazide  Letrozole  Levodopa (Carbidopa/Levodopa)  Losartan (Losartan Potassium)  Lubiprostone  Magnesium Oxide  Meloxicam  Metoprolol  Multivitamin  Omega-3 Fatty Acids  Omeprazole  Pravastatin ==================================================================== For clinical consultation, please call [phone]. ====================================================================    UDS interpretation: Compliant           Medication Assessment Form: Reviewed. Patient indicates being compliant with therapy Treatment compliance: Compliant Risk Assessment Profile: Aberrant behavior: See prior evaluations. None observed or detected today Comorbid factors increasing risk of overdose: See prior notes. No additional risks detected today Risk of substance use disorder (SUD): Low Opioid Risk Tool - 03/20/18 1034      Family History of Substance Abuse   Alcohol  Negative    Illegal Drugs  Negative    Rx Drugs  Negative      Personal History of Substance Abuse   Alcohol  Negative    Illegal Drugs  Negative    Rx Drugs  Negative      Psychological Disease   Psychological Disease  Negative    Depression  Negative      Total Score   Opioid Risk Tool Scoring  0    Opioid Risk Interpretation  Low Risk      ORT Scoring interpretation table:  Score <3 = Low Risk for SUD  Score between 4-7 = Moderate Risk for SUD  Score >8 = High Risk for Opioid Abuse   Risk Mitigation Strategies:  Patient Counseling: Covered Patient-Prescriber Agreement (PPA): Present and active  Notification to other healthcare providers: Done  Pharmacologic Plan: No change in therapy, at this time.             Post-Procedure Assessment  02/12/2018 Procedure: Thermal Lumbar Facet, Medial Branch Radiofrequency Neurotomy Pre-procedure pain score:  8/10 Post-procedure pain score: 0/10         Influential Factors: BMI: 30.90 kg/m Intra-procedural challenges: None observed.         Assessment challenges: None detected.              Reported side-effects: None.        Post-procedural adverse reactions or complications: None reported         Sedation: Please see nurses note. When no sedatives are used, the analgesic levels obtained are directly associated to the effectiveness of the local anesthetics. However, when sedation is provided, the level of analgesia obtained during the initial 1 hour following the intervention, is believed  to be the result of a combination of factors. These factors may include, but are not limited to: 1. The effectiveness of the local anesthetics used. 2. The effects of the analgesic(s) and/or anxiolytic(s) used. 3. The degree of discomfort experienced by the patient at the time of the procedure. 4. The patients ability and reliability in recalling and recording the events. 5. The presence and influence of possible secondary gains and/or psychosocial factors. Reported result: Relief experienced during the 1st hour after the procedure: 100 % (Ultra-Short Term Relief)            Interpretative annotation: Clinically appropriate result. Analgesia during this period is likely to be Local Anesthetic and/or IV Sedative (Analgesic/Anxiolytic) related.          Effects of local anesthetic: The analgesic effects attained during this period are directly associated to the localized infiltration of local anesthetics and therefore cary significant diagnostic value as  to the etiological location, or anatomical origin, of the pain. Expected duration of relief is directly dependent on the pharmacodynamics of the local anesthetic used. Long-acting (4-6 hours) anesthetics used.  Reported result: Relief during the next 4 to 6 hour after the procedure: 60 % (Short-Term Relief)            Interpretative annotation: Clinically appropriate result. Analgesia during this period is likely to be Local Anesthetic-related.          Long-term benefit: Defined as the period of time past the expected duration of local anesthetics (1 hour for short-acting and 4-6 hours for long-acting). With the possible exception of prolonged sympathetic blockade from the local anesthetics, benefits during this period are typically attributed to, or associated with, other factors such as analgesic sensory neuropraxia, antiinflammatory effects, or beneficial biochemical changes provided by agents other than the local anesthetics.  Reported result:  Extended relief following procedure: 90 % (Long-Term Relief)            Interpretative annotation: Clinically appropriate result. Good relief. No permanent benefit expected. Inflammation plays a part in the etiology to the pain.          Current benefits: Defined as reported results that persistent at this point in time.   Analgesia: >75 %            Function: Wanda Martinez reports improvement in function ROM: Wanda Martinez reports improvement in ROM Interpretative annotation: Ongoing benefit.    Adequate RF ablation.          Interpretation: Results would suggest adequate radiofrequency ablation.                  Plan:  Please see "Plan of Care" for details.                Laboratory Chemistry  Inflammation Markers (CRP: Acute Phase) (ESR: Chronic Phase) No results found for: CRP, ESRSEDRATE, LATICACIDVEN                       Rheumatology Markers No results found for: RF, ANA, [facility], LYMEIGGIGMAB, [facility]                      Renal Function Markers Lab Results  Component Value Date   BUN 20 03/04/2018   CREATININE 1.03 03/04/2018   GFRAA >60 10/16/2017   GFRNONAA 58 (L) 10/16/2017                              Hepatic Function Markers Lab Results  Component Value Date   AST 18 03/04/2018   ALT 5 03/04/2018   ALBUMIN 4.3 03/04/2018   ALKPHOS 30 (L) 03/04/2018                        Electrolytes Lab Results  Component Value Date   NA 133 (L) 03/04/2018   K 4.6 03/04/2018   CL 98 03/04/2018   CALCIUM 9.7 03/04/2018   MG 1.8 04/21/2013                        Neuropathy Markers Lab Results  Component Value Date   ZRAQTMAU63 335 02/01/2016                        Bone Pathology Markers No results found for: VD25OH, [id number],  LK4401UU7, OZ3664QI3, 25OHVITD1, 25OHVITD2, 25OHVITD3, TESTOFREE, TESTOSTERONE                       Coagulation Parameters Lab Results  Component Value Date   PLT 293.0 09/20/2017                        Cardiovascular  Markers Lab Results  Component Value Date   TROPONINI < 0.02 10/09/2014   HGB 12.0 09/20/2017   HCT 35.8 (L) 09/20/2017                         CA Markers Lab Results  Component Value Date   LABCA2 24.5 08/07/2013                        Note: Lab results reviewed.  Recent Diagnostic Imaging Results  DG C-Arm 1-60 Min-No Report Fluoroscopy was utilized by the requesting physician.  No radiographic  interpretation.   Complexity Note: Imaging results reviewed. Results shared with Wanda Martinez, using Layman's terms.                         Meds   Current Outpatient Medications:  .  amLODipine (NORVASC) 2.5 MG tablet, TAKE 1 TABLET BY MOUTH ONCE DAILY., Disp: 30 tablet, Rfl: 5 .  aspirin 81 MG EC tablet, Take 81 mg by mouth daily as needed.  , Disp: , Rfl:  .  Calcium Carbonate (CALCIUM 600) 1500 MG TABS, Take 1 tablet by mouth daily.  , Disp: , Rfl:  .  carbidopa-levodopa (SINEMET IR) 25-100 MG tablet, Take by mouth., Disp: , Rfl:  .  citalopram (CELEXA) 20 MG tablet, TAKE 1 & 1/2 TABLETS BY MOUTH ONCE DAILY, Disp: , Rfl:  .  cyclobenzaprine (FLEXERIL) 10 MG tablet, Take 1 tablet (10 mg total) by mouth at bedtime., Disp: 90 tablet, Rfl: 0 .  gabapentin (NEURONTIN) 300 MG capsule, Take 1 capsule (300 mg total) by mouth 2 (two) times daily., Disp: 180 capsule, Rfl: 0 .  GLUCOSAMINE SULFATE PO, Take by mouth daily. , Disp: , Rfl:  .  hydrochlorothiazide (HYDRODIURIL) 25 MG tablet, TAKE 1 TABLET BY MOUTH ONCE DAILY. (PATIENT TAKES 1/2 ONCE DAILY AND EXTRA 1/2 IF NEEDED), Disp: 15 tablet, Rfl: 0 .  [START ON 05/13/2018] HYDROcodone-acetaminophen (NORCO/VICODIN) 5-325 MG tablet, Take 1 tablet by mouth every 8 (eight) hours as needed for severe pain., Disp: 90 tablet, Rfl: 0 .  letrozole (FEMARA) 2.5 MG tablet, TAKE 1 TABLET BY MOUTH ONCE DAILY., Disp: 30 tablet, Rfl: 0 .  losartan (COZAAR) 100 MG tablet, TAKE 1 TABLET BY MOUTH ONCE DAILY., Disp: 30 tablet, Rfl: 0 .  lubiprostone (AMITIZA)  24 MCG capsule, TAKE 1 CAPSULE BY MOUTH ONCE DAILY WITH BREAKFAST., Disp: , Rfl:  .  magnesium oxide (MAGNESIUM-OXIDE) 400 (241.3 Mg) MG tablet, Take 1 tablet (400 mg total) by mouth daily., Disp: 90 tablet, Rfl: 0 .  meclizine (ANTIVERT) 12.5 MG tablet, Take 1 tablet (12.5 mg total) by mouth 2 (two) times daily as needed for dizziness., Disp: 20 tablet, Rfl: 0 .  meloxicam (MOBIC) 7.5 MG tablet, Take 1 tablet (7.5 mg total) by mouth daily., Disp: 90 tablet, Rfl: 0 .  metoprolol succinate (TOPROL-XL) 25 MG 24 hr tablet, TAKE 1 TABLET BY MOUTH TWICE DAILY, Disp: 60 tablet, Rfl: 0 .  Multiple Vitamin (MULTIVITAMIN) tablet, Take 1  tablet by mouth daily., Disp: , Rfl:  .  Omega-3 1000 MG CAPS, Take by mouth daily. , Disp: , Rfl:  .  omeprazole (PRILOSEC) 20 MG capsule, TAKE 1 CAPSULE BY MOUTH TWICE DAILY FOR REFLUX., Disp: 60 capsule, Rfl: 1 .  pantoprazole (PROTONIX) 40 MG tablet, TAKE 1 TABLET BY MOUTH ONCE DAILY., Disp: 30 tablet, Rfl: 0 .  pravastatin (PRAVACHOL) 20 MG tablet, TAKE 1 TABLET BY MOUTH ONCE DAILY., Disp: 30 tablet, Rfl: 0 .  [START ON 06/12/2018] HYDROcodone-acetaminophen (NORCO/VICODIN) 5-325 MG tablet, Take 1 tablet by mouth every 8 (eight) hours as needed for severe pain., Disp: 90 tablet, Rfl: 0 .  [START ON 04/13/2018] HYDROcodone-acetaminophen (NORCO/VICODIN) 5-325 MG tablet, Take 1 tablet by mouth every 8 (eight) hours as needed for severe pain., Disp: 90 tablet, Rfl: 0  ROS  Constitutional: Denies any fever or chills Gastrointestinal: No reported hemesis, hematochezia, vomiting, or acute GI distress Musculoskeletal: Denies any acute onset joint swelling, redness, loss of ROM, or weakness Neurological: No reported episodes of acute onset apraxia, aphasia, dysarthria, agnosia, amnesia, paralysis, loss of coordination, or loss of consciousness  Allergies  Wanda Martinez is allergic to sulfa antibiotics and vesicare [solifenacin].  [place]  Drug: Wanda Martinez  reports that she does not  use drugs. Alcohol:  reports that she does not drink alcohol. Tobacco:  reports that she has never smoked. She has never used smokeless tobacco. Medical:  has a past medical history of Anemia, Arm pain (07/26/2015), Arthritis, Arthritis, degenerative (03/26/2014), Back pain (11/01/2013), Breast cancer ([place]), Breast cancer ([place]), CHEST PAIN (04/29/2010), Chronic cystitis, Cystocele (02/22/2013), Cystocele, midline (08/19/2013), Degeneration of intervertebral disc of lumbosacral region (03/26/2014), DYSPNEA (04/29/2010), Enthesopathy of hip (03/26/2014), GERD (gastroesophageal reflux disease), Hiatal hernia, HTN (hypertension), LBP (low back pain) (03/26/2014), Neck pain (11/01/2013), Parkinson disease ([place]), Sinusitis (02/07/2015), Skin lesions (07/12/2014), and Urinary incontinence. Surgical: Wanda Martinez  has a past surgical history that includes Breast biopsy (2013); Tonsillectomy and adenoidectomy (79); Vesicovaginal fistula closure w/ TAH (1983); Breast surgery (1986); Breast enhancement surgery (1987); Breast implant removal; Breast implant removal (Right, 08/29/2012); Mastectomy (08/2012); Abdominal hysterectomy; and Colonoscopy with propofol (N/A, 09/13/2016). Family: family history includes Colon polyps in her father; Diabetes in her father; Parkinson's disease in her mother; Stroke in her father and mother.  Constitutional Exam  General appearance: Well nourished, well developed, and well hydrated. In no apparent acute distress Vitals:   03/20/18 1027  BP: 133/81  Pulse: 67  Resp: 16  Temp: 98.1 F (36.7 C)  TempSrc: Oral  SpO2: 97%  Weight: 180 lb (81.6 kg)  Height: _0  (1.626 m)  Psych/Mental status: Alert, oriented x 3 (person, place, & time)       Eyes: PERLA Respiratory: No evidence of acute respiratory distress  Cervical Spine Area Exam  Skin & Axial Inspection: No masses, redness, edema, swelling, or associated skin lesions Alignment: Symmetrical Functional ROM: Unrestricted ROM       Stability: No instability detected Muscle Tone/Strength: Functionally intact. No obvious neuro-muscular anomalies detected. Sensory (Neurological): Unimpaired Palpation: No palpable anomalies              Upper Extremity (UE) Exam    Side: Right upper extremity  Side: Left upper extremity  Skin & Extremity Inspection: Skin color, temperature, and hair growth are WNL. No peripheral edema or cyanosis. No masses, redness, swelling, asymmetry, or associated skin lesions. No contractures.  Skin & Extremity Inspection: Skin color, temperature, and hair growth are WNL. No peripheral edema or  cyanosis. No masses, redness, swelling, asymmetry, or associated skin lesions. No contractures.  Functional ROM: Unrestricted ROM          Functional ROM: Unrestricted ROM          Muscle Tone/Strength: Functionally intact. No obvious neuro-muscular anomalies detected.  Muscle Tone/Strength: Functionally intact. No obvious neuro-muscular anomalies detected.  Sensory (Neurological): Unimpaired          Sensory (Neurological): Unimpaired          Palpation: No palpable anomalies              Palpation: No palpable anomalies              Specialized Test(s): Deferred         Specialized Test(s): Deferred          Thoracic Spine Area Exam  Skin & Axial Inspection: No masses, redness, or swelling Alignment: Symmetrical Functional ROM: Unrestricted ROM Stability: No instability detected Muscle Tone/Strength: Functionally intact. No obvious neuro-muscular anomalies detected. Sensory (Neurological): Unimpaired Muscle strength & Tone: No palpable anomalies  Lumbar Spine Area Exam  Skin & Axial Inspection: No masses, redness, or swelling Alignment: Symmetrical Functional ROM: Unrestricted ROM      Stability: No instability detected Muscle Tone/Strength: Functionally intact. No obvious neuro-muscular anomalies detected. Sensory (Neurological): Unimpaired Palpation: Complains of area being tender to palpation        Provocative Tests: Lumbar Hyperextension and rotation test: Positive bilaterally for facet joint pain. Lumbar Lateral bending test: evaluation deferred today       Patrick's Maneuver: evaluation deferred today                    Gait & Posture Assessment  Ambulation: Unassisted Gait: Relatively normal for age and body habitus Posture: WNL   Lower Extremity Exam    Side: Right lower extremity  Side: Left lower extremity  Skin & Extremity Inspection: Skin color, temperature, and hair growth are WNL. No peripheral edema or cyanosis. No masses, redness, swelling, asymmetry, or associated skin lesions. No contractures.  Skin & Extremity Inspection: Skin color, temperature, and hair growth are WNL. No peripheral edema or cyanosis. No masses, redness, swelling, asymmetry, or associated skin lesions. No contractures.  Functional ROM: Unrestricted ROM          Functional ROM: Unrestricted ROM          Muscle Tone/Strength: Functionally intact. No obvious neuro-muscular anomalies detected.  Muscle Tone/Strength: Functionally intact. No obvious neuro-muscular anomalies detected.  Sensory (Neurological): Unimpaired  Sensory (Neurological): Unimpaired  Palpation: No palpable anomalies  Palpation: No palpable anomalies   Assessment  Primary Diagnosis & Pertinent Problem List: The primary encounter diagnosis was Lumbar spondylosis. Diagnoses of Chronic low back pain (Primary Source of Pain) (Bilateral) (R>L), Primary osteoarthritis involving multiple joints, Chronic pain syndrome, Diffuse myofascial pain syndrome, and Neurogenic pain were also pertinent to this visit.  Status Diagnosis  Persistent Persistent Persistent 1. Lumbar spondylosis   2. Chronic low back pain (Primary Source of Pain) (Bilateral) (R>L)   3. Primary osteoarthritis involving multiple joints   4. Chronic pain syndrome   5. Diffuse myofascial pain syndrome   6. Neurogenic pain     Problems updated and reviewed during this  visit: No problems updated. Plan of Care  Pharmacotherapy (Medications Ordered): Meds ordered this encounter  Medications  . HYDROcodone-acetaminophen (NORCO/VICODIN) 5-325 MG tablet    Sig: Take 1 tablet by mouth every 8 (eight) hours as needed for severe pain.  Dispense:  90 tablet    Refill:  0    Do not place this medication, or any other prescription from our practice, on "Automatic Refill". Patient may have prescription filled one day early if pharmacy is closed on scheduled refill date. Do not fill until:06/12/2018 To last until: 07/12/2018    Order Specific Question:   Supervising Provider    Answer:   Milinda Pointer [phone]  . HYDROcodone-acetaminophen (NORCO/VICODIN) 5-325 MG tablet    Sig: Take 1 tablet by mouth every 8 (eight) hours as needed for severe pain.    Dispense:  90 tablet    Refill:  0    Do not place this medication, or any other prescription from our practice, on "Automatic Refill". Patient may have prescription filled one day early if pharmacy is closed on scheduled refill date. Do not fill until: 05/13/2018 To last until: 06/12/2018    Order Specific Question:   Supervising Provider    Answer:   Milinda Pointer [phone]  . HYDROcodone-acetaminophen (NORCO/VICODIN) 5-325 MG tablet    Sig: Take 1 tablet by mouth every 8 (eight) hours as needed for severe pain.    Dispense:  90 tablet    Refill:  0    Do not place this medication, or any other prescription from our practice, on "Automatic Refill". Patient may have prescription filled one day early if pharmacy is closed on scheduled refill date. Do not fill until:04/13/2018 To last until: 05/13/2018    Order Specific Question:   Supervising Provider    Answer:   Milinda Pointer [phone]  . magnesium oxide (MAGNESIUM-OXIDE) 400 (241.3 Mg) MG tablet    Sig: Take 1 tablet (400 mg total) by mouth daily.    Dispense:  90 tablet    Refill:  0    Do not place this medication, or any other prescription from  our practice, on "Automatic Refill". Patient may have prescription filled one day early if pharmacy is closed on scheduled refill date.    Order Specific Question:   Supervising Provider    Answer:   Milinda Pointer [phone]  . cyclobenzaprine (FLEXERIL) 10 MG tablet    Sig: Take 1 tablet (10 mg total) by mouth at bedtime.    Dispense:  90 tablet    Refill:  0    Do not place this medication, or any other prescription from our practice, on "Automatic Refill". Patient may have prescription filled one day early if pharmacy is closed on scheduled refill date.    Order Specific Question:   Supervising Provider    Answer:   Milinda Pointer [phone]  . gabapentin (NEURONTIN) 300 MG capsule    Sig: Take 1 capsule (300 mg total) by mouth 2 (two) times daily.    Dispense:  180 capsule    Refill:  0    Do not place this medication, or any other prescription from our practice, on "Automatic Refill". Patient may have prescription filled one day early if pharmacy is closed on scheduled refill date.    Order Specific Question:   Supervising Provider    Answer:   Milinda Pointer [phone]  . meloxicam (MOBIC) 7.5 MG tablet    Sig: Take 1 tablet (7.5 mg total) by mouth daily.    Dispense:  90 tablet    Refill:  0    Do not place this medication, or any other prescription from our practice, on "Automatic Refill". Patient may have prescription filled one day early if pharmacy is closed on  scheduled refill date.    Order Specific Question:   Supervising Provider    Answer:   Milinda Pointer [[id number]]   New Prescriptions   No medications on file   Medications administered today: Alliah [employer]" had no medications administered during this visit. Lab-work, procedure(s), and/or referral(s): Orders Placed This Encounter  Procedures  . LUMBAR FACET(MEDIAL BRANCH NERVE BLOCK) MBNB   Imaging and/or referral(s): None  Interventional therapies: Planned, scheduled, and/or  pending: Palliative right-sided lumbar facet block    Considering:  Diagnosticright-sided lumbar facet block  Palliative right-sided lumbar facet radiofrequency ablation  PalliativeRight suprascapular muscle trigger point injection  Diagnosticbilateral cervical facet block  Possiblebilateral cervical facet radiofrequency ablation  Diagnostic right-sided cervical epidural steroid injection  Diagnosticintra-articular hip joint injection  Possible hip joint radiofrequency ablation **   Palliative PRN treatment(s):  Diagnostic right-sided lumbar facet block  Palliativeright-sided lumbar facet radiofrequency ablation  Palliative Right suprascapular muscle trigger point injection  Diagnosticbilateral cervical facet block  Diagnosticright-sided cervical epidural steroid injection Diagnosticintra-articular hip joint injection    Provider-requested follow-up: Return in about 3 months (around 07/03/2018) for MedMgmt with Me Dionisio David), in addition, Procedure(w/Sedation), w/ Dr. Dossie Arbour.  Future Appointments  Date Time Provider [place]  03/28/2018  9:30 AM Milinda Pointer, MD [facility] None  04/09/2018  2:30 PM Lloyd Huger, MD [facility] None  07/03/2018 11:00 AM Vevelyn Francois, NP [facility] None  07/09/2018  9:30 AM Einar Pheasant, MD [facility]   Primary Care Physician: Einar Pheasant, MD Location: [facility] Note by: Vevelyn Francois NP Date: 03/20/2018; Time: 11:55 AM  Pain Score Disclaimer: We use the NRS-11 scale. This is a self-reported, subjective measurement of pain severity with only modest accuracy. It is used primarily to identify changes within a particular patient. It must be understood that outpatient pain scales are significantly less accurate that those used for research, where they can be applied under ideal controlled circumstances with minimal exposure to variables. In reality, the score is likely to be  a combination of pain intensity and pain affect, where pain affect describes the degree of emotional arousal or changes in action readiness caused by the sensory experience of pain. Factors such as social and work situation, setting, emotional state, anxiety levels, expectation, and prior pain experience may influence pain perception and show large inter-individual differences that may also be affected by time variables.  Patient instructions provided during this appointment: Patient Instructions  ____________________________________________________________________________________________  Medication Rules  Applies to: All patients receiving prescriptions (written or electronic).  Pharmacy of record: Pharmacy where electronic prescriptions will be sent. If written prescriptions are taken to a different pharmacy, please inform the nursing staff. The pharmacy listed in the electronic medical record should be the one where you would like electronic prescriptions to be sent.  Prescription refills: Only during scheduled appointments. Applies to both, written and electronic prescriptions.  NOTE: The following applies primarily to controlled substances (Opioid* Pain Medications).   Patient's responsibilities: 1. Pain Pills: Bring all pain pills to every appointment (except for procedure appointments). 2. Pill Bottles: Bring pills in original pharmacy bottle. Always bring newest bottle. Bring bottle, even if empty. 3. Medication refills: You are responsible for knowing and keeping track of what medications you need refilled. The day before your appointment, write a list of all prescriptions that need to be refilled. Bring that list to your appointment and give it to the admitting nurse. Prescriptions will be written only during appointments. If you  forget a medication, it will not be "Called in", "Faxed", or "electronically sent". You will need to get another appointment to get these  prescribed. 4. Prescription Accuracy: You are responsible for carefully inspecting your prescriptions before leaving our office. Have the discharge nurse carefully go over each prescription with you, before taking them home. Make sure that your name is accurately spelled, that your address is correct. Check the name and dose of your medication to make sure it is accurate. Check the number of pills, and the written instructions to make sure they are clear and accurate. Make sure that you are given enough medication to last until your next medication refill appointment. 5. Taking Medication: Take medication as prescribed. Never take more pills than instructed. Never take medication more frequently than prescribed. Taking less pills or less frequently is permitted and encouraged, when it comes to controlled substances (written prescriptions).  6. Inform other Doctors: Always inform, all of your healthcare providers, of all the medications you take. 7. Pain Medication from other Providers: You are not allowed to accept any additional pain medication from any other Doctor or Healthcare provider. There are two exceptions to this rule. (see below) In the event that you require additional pain medication, you are responsible for notifying us, as stated below. 8. Medication Agreement: You are responsible for carefully reading and following our Medication Agreement. This must be signed before receiving any prescriptions from our practice. Safely store a copy of your signed Agreement. Violations to the Agreement will result in no further prescriptions. (Additional copies of our Medication Agreement are available upon request.) 9. Laws, Rules, & Regulations: All patients are expected to follow all Federal and [employer], Rules, [employer]. Ignorance of the Laws does not constitute a valid excuse. The use of any illegal substances is prohibited. 10. Adopted CDC guidelines & recommendations: Target dosing  levels will be at or below 60 MME/day. Use of benzodiazepines** is not recommended.  Exceptions: There are only two exceptions to the rule of not receiving pain medications from other Healthcare Providers. 1. Exception #1 (Emergencies): In the event of an emergency (i.e.: accident requiring emergency care), you are allowed to receive additional pain medication. However, you are responsible for: As soon as you are able, call our office [phone], at any time of the day or night, and leave a message stating your name, the date and nature of the emergency, and the name and dose of the medication prescribed. In the event that your call is answered by a member of our staff, make sure to document and save the date, time, and the name of the person that took your information.  2. Exception #2 (Planned Surgery): In the event that you are scheduled by another doctor or dentist to have any type of surgery or procedure, you are allowed (for a period no longer than 30 days), to receive additional pain medication, for the acute post-op pain. However, in this case, you are responsible for picking up a copy of our "Post-op Pain Management for Surgeons" handout, and giving it to your surgeon or dentist. This document is available at our office, and does not require an appointment to obtain it. Simply go to our office during business hours (Monday-Thursday from 8:00 AM to 4:00 PM) (Friday 8:00 AM to 12:00 Noon) or if you have a scheduled appointment with [place], prior to your surgery, and ask for it by name. In addition, you will need to provide [place] with your name, name  of your surgeon, type of surgery, and date of procedure or surgery.  *Opioid medications include: morphine, codeine, oxycodone, oxymorphone, hydrocodone, hydromorphone, meperidine, tramadol, tapentadol, buprenorphine, fentanyl, methadone. **Benzodiazepine medications include: diazepam (Valium), alprazolam (Xanax), clonazepam (Klonopine), lorazepam (Ativan),  clorazepate (Tranxene), chlordiazepoxide (Librium), estazolam (Prosom), oxazepam (Serax), temazepam (Restoril), triazolam (Halcion) (Last updated: 02/14/2018) ____________________________________________________________________________________________   ____________________________________________________________________________________________  General Risks and Possible Complications  Patient Responsibilities: It is important that you read this as it is part of your informed consent. It is our duty to inform you of the risks and possible complications associated with treatments offered to you. It is your responsibility as a patient to read this and to ask questions about anything that is not clear or that you believe was not covered in this document.  Patient's Rights: You have the right to refuse treatment. You also have the right to change your mind, even after initially having agreed to have the treatment done. However, under this last option, if you wait until the last second to change your mind, you may be charged for the materials used up to that point.  Introduction: Medicine is not an Chief Strategy Officer. Everything in Medicine, including the lack of treatment(s), carries the potential for danger, harm, or loss (which is by definition: Risk). In Medicine, a complication is a secondary problem, condition, or disease that can aggravate an already existing one. All treatments carry the risk of possible complications. The fact that a side effects or complications occurs, does not imply that the treatment was conducted incorrectly. It must be clearly understood that these can happen even when everything is done following the highest safety standards.  No treatment: You can choose not to proceed with the proposed treatment alternative. The "PRO(s)" would include: avoiding the risk of complications associated with the therapy. The "CON(s)" would include: not getting any of the treatment benefits. These  benefits fall under one of three categories: diagnostic; therapeutic; and/or palliative. Diagnostic benefits include: getting information which can ultimately lead to improvement of the disease or symptom(s). Therapeutic benefits are those associated with the successful treatment of the disease. Finally, palliative benefits are those related to the decrease of the primary symptoms, without necessarily curing the condition (example: decreasing the pain from a flare-up of a chronic condition, such as incurable terminal cancer).  General Risks and Complications: These are associated to most interventional treatments. They can occur alone, or in combination. They fall under one of the following six (6) categories: no benefit or worsening of symptoms; bleeding; infection; nerve damage; allergic reactions; and/or death. 1. No benefits or worsening of symptoms: In Medicine there are no guarantees, only probabilities. No healthcare provider can ever guarantee that a medical treatment will work, they can only state the probability that it may. Furthermore, there is always the possibility that the condition may worsen, either directly, or indirectly, as a consequence of the treatment. 2. Bleeding: This is more common if the patient is taking a blood thinner, either prescription or over the counter (example: Goody Powders, Fish oil, Aspirin, Garlic, etc.), or if suffering a condition associated with impaired coagulation (example: Hemophilia, cirrhosis of the liver, low platelet counts, etc.). However, even if you do not have one on these, it can still happen. If you have any of these conditions, or take one of these drugs, make sure to notify your treating physician. 3. Infection: This is more common in patients with a compromised immune system, either due to disease (example: diabetes, cancer, human immunodeficiency virus [HIV], etc.),  or due to medications or treatments (example: therapies used to treat cancer and  rheumatological diseases). However, even if you do not have one on these, it can still happen. If you have any of these conditions, or take one of these drugs, make sure to notify your treating physician. 4. Nerve Damage: This is more common when the treatment is an invasive one, but it can also happen with the use of medications, such as those used in the treatment of cancer. The damage can occur to small secondary nerves, or to large primary ones, such as those in the spinal cord and brain. This damage may be temporary or permanent and it may lead to impairments that can range from temporary numbness to permanent paralysis and/or brain death. 5. Allergic Reactions: Any time a substance or material comes in contact with our body, there is the possibility of an allergic reaction. These can range from a mild skin rash (contact dermatitis) to a severe systemic reaction (anaphylactic reaction), which can result in death. 6. Death: In general, any medical intervention can result in death, most of the time due to an unforeseen complication. ____________________________________________________________________________________________  Facet Blocks Patient Information  Description: The facets are joints in the spine between the vertebrae.  Like any joints in the body, facets can become irritated and painful.  Arthritis can also effect the facets.  By injecting steroids and local anesthetic in and around these joints, we can temporarily block the nerve supply to them.  Steroids act directly on irritated nerves and tissues to reduce selling and inflammation which often leads to decreased pain.  Facet blocks may be done anywhere along the spine from the neck to the low back depending upon the location of your pain.   After numbing the skin with local anesthetic (like Novocaine), a small needle is passed onto the facet joints under x-ray guidance.  You may experience a sensation of pressure while this is being done.   The entire block usually lasts about 15-25 minutes.   Conditions which may be treated by facet blocks:   Low back/buttock pain  Neck/shoulder pain  Certain types of headaches  Preparation for the injection:  1. Do not eat any solid food or dairy products within 8 hours of your appointment. 2. You may drink clear liquid up to 3 hours before appointment.  Clear liquids include water, black coffee, juice or soda.  No milk or cream please. 3. You may take your regular medication, including pain medications, with a sip of water before your appointment.  Diabetics should hold regular insulin (if taken separately) and take 1/2 normal NPH dose the morning of the procedure.  Carry some sugar containing items with you to your appointment. 4. A driver must accompany you and be prepared to drive you home after your procedure. 5. Bring all your current medications with you. 6. An IV may be inserted and sedation may be given at the discretion of the physician. 7. A blood pressure cuff, EKG and other monitors will often be applied during the procedure.  Some patients may need to have extra oxygen administered for a short period. 8. You will be asked to provide medical information, including your allergies and medications, prior to the procedure.  We must know immediately if you are taking blood thinners (like Coumadin/Warfarin) or if you are allergic to IV iodine contrast (dye).  We must know if you could possible be pregnant.  Possible side-effects:   Bleeding from needle site  Infection (rare,  may require surgery)  Nerve injury (rare)  Numbness & tingling (temporary)  Difficulty urinating (rare, temporary)  Spinal headache (a headache worse with upright posture)  Light-headedness (temporary)  Pain at injection site (serveral days)  Decreased blood pressure (rare, temporary)  Weakness in arm/leg (temporary)  Pressure sensation in back/neck (temporary)   Call if you  experience:   Fever/chills associated with headache or increased back/neck pain  Headache worsened by an upright position  New onset, weakness or numbness of an extremity below the injection site  Hives or difficulty breathing (go to the emergency room)  Inflammation or drainage at the injection site(s)  Severe back/neck pain greater than usual  New symptoms which are concerning to you  Please note:  Although the local anesthetic injected can often make your back or neck feel good for several hours after the injection, the pain will likely return. It takes 3-7 days for steroids to work.  You may not notice any pain relief for at least one week.  If effective, we will often do a series of 2-3 injections spaced 3-6 weeks apart to maximally decrease your pain.  After the initial series, you may be a candidate for a more permanent nerve block of the facets.  If you have any questions, please call #[phone] [facility]

## 2018-03-22 ENCOUNTER — Other Ambulatory Visit: Payer: Self-pay | Admitting: Internal Medicine

## 2018-03-28 ENCOUNTER — Ambulatory Visit
Admission: RE | Admit: 2018-03-28 | Discharge: 2018-03-28 | Disposition: A | Discharge status: Home / Self Care | Payer: Medicare Other | Source: Ambulatory Visit | Attending: Pain Medicine | Admitting: Pain Medicine

## 2018-03-28 ENCOUNTER — Ambulatory Visit (HOSPITAL_BASED_OUTPATIENT_CLINIC_OR_DEPARTMENT_OTHER): Payer: Medicare Other | Admitting: Pain Medicine

## 2018-03-28 ENCOUNTER — Encounter: Payer: Self-pay | Admitting: Pain Medicine

## 2018-03-28 ENCOUNTER — Other Ambulatory Visit: Payer: Self-pay

## 2018-03-28 VITALS — BP 138/88 | HR 65 | Temp 98.0°F | Resp 12 | Ht 64.0 in | Wt 180.0 lb

## 2018-03-28 DIAGNOSIS — Z882 Allergy status to sulfonamides status: Secondary | ICD-10-CM | POA: Insufficient documentation

## 2018-03-28 DIAGNOSIS — Z79899 Other long term (current) drug therapy: Secondary | ICD-10-CM | POA: Diagnosis not present

## 2018-03-28 DIAGNOSIS — M47817 Spondylosis without myelopathy or radiculopathy, lumbosacral region: Secondary | ICD-10-CM | POA: Diagnosis not present

## 2018-03-28 DIAGNOSIS — M545 Low back pain: Secondary | ICD-10-CM | POA: Diagnosis not present

## 2018-03-28 DIAGNOSIS — G8929 Other chronic pain: Secondary | ICD-10-CM | POA: Diagnosis not present

## 2018-03-28 DIAGNOSIS — Z7982 Long term (current) use of aspirin: Secondary | ICD-10-CM | POA: Diagnosis not present

## 2018-03-28 DIAGNOSIS — M47816 Spondylosis without myelopathy or radiculopathy, lumbar region: Secondary | ICD-10-CM

## 2018-03-28 MED ORDER — TRIAMCINOLONE ACETONIDE 40 MG/ML IJ SUSP
INTRAMUSCULAR | Status: AC
  Filled 2018-03-28: qty 1

## 2018-03-28 MED ORDER — ROPIVACAINE HCL 2 MG/ML IJ SOLN
9.0000 mL | Freq: Once | INTRAMUSCULAR | Status: AC
  Administered 2018-03-28: 9 mL via PERINEURAL

## 2018-03-28 MED ORDER — LIDOCAINE HCL 2 % IJ SOLN
20.0000 mL | Freq: Once | INTRAMUSCULAR | Status: AC
  Administered 2018-03-28: 400 mg

## 2018-03-28 MED ORDER — LIDOCAINE HCL 2 % IJ SOLN
INTRAMUSCULAR | Status: AC
  Filled 2018-03-28: qty 20

## 2018-03-28 MED ORDER — TRIAMCINOLONE ACETONIDE 40 MG/ML IJ SUSP
40.0000 mg | Freq: Once | INTRAMUSCULAR | Status: AC
  Administered 2018-03-28: 40 mg

## 2018-03-28 MED ORDER — ROPIVACAINE HCL 2 MG/ML IJ SOLN
INTRAMUSCULAR | Status: AC
  Filled 2018-03-28: qty 10

## 2018-03-28 NOTE — Patient Instructions (Signed)

## 2018-03-28 NOTE — Progress Notes (Signed)
Patient's Name: Wanda Martinez  MRN: [id number]  Referring Provider: Einar Pheasant, MD  DOB: 08/30/1935  PCP: Einar Pheasant, MD  DOS: 03/28/2018  Note by: Gaspar Cola, MD  Service setting: Ambulatory outpatient  Specialty: Interventional Pain Management  Patient type: Established  Location: [facility] (AMB) Pain Management Facility  Visit type: Interventional Procedure   Primary Reason for Visit: Interventional Pain Management Treatment. CC: Back Pain  Procedure:       Anesthesia, Analgesia, Anxiolysis:  Type: Lumbar Facet, Medial Branch Block(s)          Primary Purpose: Palliative Region: Posterolateral Lumbosacral Spine Level: L2, L3, L4, L5, & S1 Medial Branch Level(s). Injecting these levels blocks the L3-4, L4-5, and L5-S1 lumbar facet joints. Laterality: Right  Type: Local Anesthesia Indication(s): Analgesia         Route: Infiltration ([place]/IM) IV Access: Declined Sedation: Declined  Local Anesthetic: Lidocaine 1-2%   Indications: 1. Spondylosis without myelopathy or radiculopathy, lumbosacral region   2. Lumbar facet syndrome (Bilateral) (R>L)   3. Chronic low back pain (Primary Source of Pain) (Bilateral) (R>L)    Pain Score: Pre-procedure: 6 /10 Post-procedure: 0-No pain/10  Pre-op Assessment:  Wanda Martinez is a 83 y.o. (year old), female patient, seen today for interventional treatment. She  has a past surgical history that includes Breast biopsy (2013); Tonsillectomy and adenoidectomy (79); Vesicovaginal fistula closure w/ TAH (1983); Breast surgery (1986); Breast enhancement surgery (1987); Breast implant removal; Breast implant removal (Right, 08/29/2012); Mastectomy (08/2012); Abdominal hysterectomy; and Colonoscopy with propofol (N/A, 09/13/2016). Wanda Martinez has a current medication list which includes the following prescription(s): amlodipine, aspirin, calcium carbonate, carbidopa-levodopa, citalopram, cyclobenzaprine, gabapentin, glucosamine sulfate,  hydrochlorothiazide, hydrocodone-acetaminophen, hydrocodone-acetaminophen, hydrocodone-acetaminophen, letrozole, losartan, lubiprostone, magnesium oxide, meclizine, meloxicam, metoprolol succinate, multivitamin, omega-3, omeprazole, pantoprazole, and pravastatin. Her primarily concern today is the Back Pain  The patient indicates that the left side is doing great since her last radiofrequency.  Initial Vital Signs:  Pulse Rate: 64 Temp: 98 F (36.7 C) Resp: 18 BP: (!) 148/79 SpO2: 99 %  BMI: Estimated body mass index is 30.9 kg/m as calculated from the following:   Height as of this encounter: 5\' 4"  (1.626 m).   Weight as of this encounter: 180 lb (81.6 kg).  Risk Assessment: Allergies: Reviewed. She is allergic to sulfa antibiotics and vesicare [solifenacin].  Allergy Precautions: None required Coagulopathies: Reviewed. None identified.  Blood-thinner therapy: None at this time Active Infection(s): Reviewed. None identified. Wanda Martinez is afebrile  Site Confirmation: Wanda Martinez was asked to confirm the procedure and laterality before marking the site Procedure checklist: Completed Consent: Before the procedure and under the influence of no sedative(s), amnesic(s), or anxiolytics, the patient was informed of the treatment options, risks and possible complications. To fulfill our ethical and legal obligations, as recommended by the American Medical Association's Code of Ethics, I have informed the patient of my clinical impression; the nature and purpose of the treatment or procedure; the risks, benefits, and possible complications of the intervention; the alternatives, including doing nothing; the risk(s) and benefit(s) of the alternative treatment(s) or procedure(s); and the risk(s) and benefit(s) of doing nothing. The patient was provided information about the general risks and possible complications associated with the procedure. These may include, but are not limited to: failure to  achieve desired goals, infection, bleeding, organ or nerve damage, allergic reactions, paralysis, and death. In addition, the patient was informed of those risks and complications associated to Spine-related procedures, such as failure to decrease  pain; infection (i.e.: Meningitis, epidural or intraspinal abscess); bleeding (i.e.: epidural hematoma, subarachnoid hemorrhage, or any other type of intraspinal or peri-dural bleeding); organ or nerve damage (i.e.: Any type of peripheral nerve, nerve root, or spinal cord injury) with subsequent damage to sensory, motor, and/or autonomic systems, resulting in permanent pain, numbness, and/or weakness of one or several areas of the body; allergic reactions; (i.e.: anaphylactic reaction); and/or death. Furthermore, the patient was informed of those risks and complications associated with the medications. These include, but are not limited to: allergic reactions (i.e.: anaphylactic or anaphylactoid reaction(s)); adrenal axis suppression; blood sugar elevation that in diabetics may result in ketoacidosis or comma; water retention that in patients with history of congestive heart failure may result in shortness of breath, pulmonary edema, and decompensation with resultant heart failure; weight gain; swelling or edema; medication-induced neural toxicity; particulate matter embolism and blood vessel occlusion with resultant organ, and/or nervous system infarction; and/or aseptic necrosis of one or more joints. Finally, the patient was informed that Medicine is not an exact science; therefore, there is also the possibility of unforeseen or unpredictable risks and/or possible complications that may result in a catastrophic outcome. The patient indicated having understood very clearly. We have given the patient no guarantees and we have made no promises. Enough time was given to the patient to ask questions, all of which were answered to the patient's satisfaction. Wanda Martinez has  indicated that she wanted to continue with the procedure. Attestation: I, the ordering provider, attest that I have discussed with the patient the benefits, risks, side-effects, alternatives, likelihood of achieving goals, and potential problems during recovery for the procedure that I have provided informed consent. Date  Time: 03/28/2018  9:32 AM  Pre-Procedure Preparation:  Monitoring: As per clinic protocol. Respiration, ETCO2, SpO2, BP, heart rate and rhythm monitor placed and checked for adequate function Safety Precautions: Patient was assessed for positional comfort and pressure points before starting the procedure. Time-out: I initiated and conducted the "Time-out" before starting the procedure, as per protocol. The patient was asked to participate by confirming the accuracy of the "Time Out" information. Verification of the correct person, site, and procedure were performed and confirmed by me, the nursing staff, and the patient. "Time-out" conducted as per Joint Commission's Universal Protocol (UP.01.01.01). Time: 1002  Description of Procedure:       Position: Prone Laterality: Right Levels:  L2, L3, L4, L5, & S1 Medial Branch Level(s) Area Prepped: Posterior Lumbosacral Region Prepping solution: ChloraPrep (2% chlorhexidine gluconate and 70% isopropyl alcohol) Safety Precautions: Aspiration looking for blood return was conducted prior to all injections. At no point did we inject any substances, as a needle was being advanced. Before injecting, the patient was told to immediately notify me if she was experiencing any new onset of "ringing in the ears, or metallic taste in the mouth". No attempts were made at seeking any paresthesias. Safe injection practices and needle disposal techniques used. Medications properly checked for expiration dates. SDV (single dose vial) medications used. After the completion of the procedure, all disposable equipment used was discarded in the proper  designated medical waste containers. Local Anesthesia: Protocol guidelines were followed. The patient was positioned over the fluoroscopy table. The area was prepped in the usual manner. The time-out was completed. The target area was identified using fluoroscopy. A 12-in long, straight, sterile hemostat was used with fluoroscopic guidance to locate the targets for each level blocked. Once located, the skin was marked with an approved surgical  skin marker. Once all sites were marked, the skin (epidermis, dermis, and hypodermis), as well as deeper tissues (fat, connective tissue and muscle) were infiltrated with a small amount of a short-acting local anesthetic, loaded on a 10cc syringe with a 25G, 1.5-in  Needle. An appropriate amount of time was allowed for local anesthetics to take effect before proceeding to the next step. Local Anesthetic: Lidocaine 2.0% The unused portion of the local anesthetic was discarded in the proper designated containers. Technical explanation of process:  L2 Medial Branch Nerve Block (MBB): The target area for the L2 medial branch is at the junction of the postero-lateral aspect of the superior articular process and the superior, posterior, and medial edge of the transverse process of L3. Under fluoroscopic guidance, a Quincke needle was inserted until contact was made with os over the superior postero-lateral aspect of the pedicular shadow (target area). After negative aspiration for blood, 2.0 mL of the nerve block solution was injected without difficulty or complication. The needle was removed intact. L3 Medial Branch Nerve Block (MBB): The target area for the L3 medial branch is at the junction of the postero-lateral aspect of the superior articular process and the superior, posterior, and medial edge of the transverse process of L4. Under fluoroscopic guidance, a Quincke needle was inserted until contact was made with os over the superior postero-lateral aspect of the  pedicular shadow (target area). After negative aspiration for blood, 2.0 mL of the nerve block solution was injected without difficulty or complication. The needle was removed intact. L4 Medial Branch Nerve Block (MBB): The target area for the L4 medial branch is at the junction of the postero-lateral aspect of the superior articular process and the superior, posterior, and medial edge of the transverse process of L5. Under fluoroscopic guidance, a Quincke needle was inserted until contact was made with os over the superior postero-lateral aspect of the pedicular shadow (target area). After negative aspiration for blood, 2.0 mL of the nerve block solution was injected without difficulty or complication. The needle was removed intact. L5 Medial Branch Nerve Block (MBB): The target area for the L5 medial branch is at the junction of the postero-lateral aspect of the superior articular process and the superior, posterior, and medial edge of the sacral ala. Under fluoroscopic guidance, a Quincke needle was inserted until contact was made with os over the superior postero-lateral aspect of the pedicular shadow (target area). After negative aspiration for blood, 2.0 mL of the nerve block solution was injected without difficulty or complication. The needle was removed intact. S1 Medial Branch Nerve Block (MBB): The target area for the S1 medial branch is at the posterior and inferior 6 o'clock position of the L5-S1 facet joint. Under fluoroscopic guidance, the Quincke needle inserted for the L5 MBB was redirected until contact was made with os over the inferior and postero aspect of the sacrum, at the 6 o' clock position under the L5-S1 facet joint (Target area). After negative aspiration for blood, 2.0 mL of the nerve block solution was injected without difficulty or complication. The needle was removed intact. Procedural Needles: 25-gauge, 3.5-inch, Quincke needles used for all levels. Nerve block solution: 0.2%  PF-Ropivacaine + Triamcinolone (40 mg/mL) diluted to a final concentration of 4 mg of Triamcinolone/mL of Ropivacaine The unused portion of the solution was discarded in the proper designated containers.  Once the entire procedure was completed, the treated area was cleaned, making sure to leave some of the prepping solution back to take advantage  of its long term bactericidal properties.   Illustration of the posterior view of the lumbar spine and the posterior neural structures. Laminae of L2 through S1 are labeled. DPRL5, dorsal primary ramus of L5; DPRS1, dorsal primary ramus of S1; DPR3, dorsal primary ramus of L3; FJ, facet (zygapophyseal) joint L3-L4; I, inferior articular process of L4; LB1, lateral branch of dorsal primary ramus of L1; IAB, inferior articular branches from L3 medial branch (supplies L4-L5 facet joint); IBP, intermediate branch plexus; MB3, medial branch of dorsal primary ramus of L3; NR3, third lumbar nerve root; S, superior articular process of L5; SAB, superior articular branches from L4 (supplies L4-5 facet joint also); TP3, transverse process of L3.  Vitals:   03/28/18 0950 03/28/18 1000 03/28/18 1005 03/28/18 1012  BP: (!) 153/91 103/72 (!) 145/91 138/88  Pulse: 60 64 66 65  Resp: 14 12 13 12   Temp:      SpO2: 96% 100% 99% 99%  Weight:      Height:        Start Time: 1002 hrs. End Time: 1008 hrs.  Imaging Guidance (Spinal):  Type of Imaging Technique: Fluoroscopy Guidance (Spinal) Indication(s): Assistance in needle guidance and placement for procedures requiring needle placement in or near specific anatomical locations not easily accessible without such assistance. Exposure Time: Please see nurses notes. Contrast: None used. Fluoroscopic Guidance: I was personally present during the use of fluoroscopy. "Tunnel Vision Technique" used to obtain the best possible view of the target area. Parallax error corrected before commencing the procedure.  "Direction-depth-direction" technique used to introduce the needle under continuous pulsed fluoroscopy. Once target was reached, antero-posterior, oblique, and lateral fluoroscopic projection used confirm needle placement in all planes. Images permanently stored in EMR. Interpretation: No contrast injected. I personally interpreted the imaging intraoperatively. Adequate needle placement confirmed in multiple planes. Permanent images saved into the patient's record.  Antibiotic Prophylaxis:   Anti-infectives (From admission, onward)   None     Indication(s): None identified  Post-operative Assessment:  Post-procedure Vital Signs:  Pulse Rate: 65 Temp: 98 F (36.7 C) Resp: 12 BP: 138/88 SpO2: 99 %  EBL: None  Complications: No immediate post-treatment complications observed by team, or reported by patient.  Note: The patient tolerated the entire procedure well. A repeat set of vitals were taken after the procedure and the patient was kept under observation following institutional policy, for this type of procedure. Post-procedural neurological assessment was performed, showing return to baseline, prior to discharge. The patient was provided with post-procedure discharge instructions, including a section on how to identify potential problems. Should any problems arise concerning this procedure, the patient was given instructions to immediately contact us, at any time, without hesitation. In any case, we plan to contact the patient by telephone for a follow-up status report regarding this interventional procedure.  Comments:  No additional relevant information.  Plan of Care    Imaging Orders     DG C-Arm 1-60 Min-No Report  Procedure Orders     LUMBAR FACET(MEDIAL BRANCH NERVE BLOCK) MBNB  Medications ordered for procedure: Meds ordered this encounter  Medications  . lidocaine (XYLOCAINE) 2 % (with pres) injection 400 mg  . ropivacaine (PF) 2 mg/mL (0.2%) (NAROPIN) injection 9  mL  . triamcinolone acetonide (KENALOG-40) injection 40 mg   Medications administered: We administered lidocaine, ropivacaine (PF) 2 mg/mL (0.2%), and triamcinolone acetonide.  See the medical record for exact dosing, route, and time of administration.  New Prescriptions   No medications on file  Disposition: Discharge home  Discharge Date & Time: 03/28/2018; 1019 hrs.   Physician-requested Follow-up: Return for post-procedure eval (2 wks), w/ Dr. Dossie Arbour.  Future Appointments  Date Time Provider [place]  04/09/2018  2:30 PM Lloyd Huger, MD [facility] None  04/22/2018  1:30 PM Milinda Pointer, MD [facility] None  07/03/2018 11:00 AM Vevelyn Francois, NP [facility] None  07/09/2018  9:30 AM Einar Pheasant, MD [facility]   Primary Care Physician: Einar Pheasant, MD Location: [facility] Note by: Gaspar Cola, MD Date: 03/28/2018; Time: 10:22 AM  Disclaimer:  Medicine is not an Chief Strategy Officer. The only guarantee in medicine is that nothing is guaranteed. It is important to note that the decision to proceed with this intervention was based on the information collected from the patient. The Data and conclusions were drawn from the patient's questionnaire, the interview, and the physical examination. Because the information was provided in large part by the patient, it cannot be guaranteed that it has not been purposely or unconsciously manipulated. Every effort has been made to obtain as much relevant data as possible for this evaluation. It is important to note that the conclusions that lead to this procedure are derived in large part from the available data. Always take into account that the treatment will also be dependent on availability of resources and existing treatment guidelines, considered by other Pain Management Practitioners as being common knowledge and practice, at the time of the intervention. For Medico-Legal purposes, it is  also important to point out that variation in procedural techniques and pharmacological choices are the acceptable norm. The indications, contraindications, technique, and results of the above procedure should only be interpreted and judged by a Board-Certified Interventional Pain Specialist with extensive familiarity and expertise in the same exact procedure and technique.

## 2018-03-28 NOTE — Progress Notes (Signed)
Safety precautions to be maintained throughout the outpatient stay will include: orient to surroundings, keep bed in low position, maintain call bell within reach at all times, provide assistance with transfer out of bed and ambulation.  

## 2018-03-28 NOTE — Addendum Note (Signed)
Addended by: Milinda Pointer A on: 03/28/2018 10:23 AM   Modules accepted: Orders

## 2018-03-29 ENCOUNTER — Telehealth: Payer: Self-pay

## 2018-03-29 NOTE — Telephone Encounter (Signed)
Post procedure phone call. Patient states she is doing good.  

## 2018-04-02 ENCOUNTER — Ambulatory Visit: Payer: Medicare Other | Admitting: Nurse Practitioner

## 2018-04-07 NOTE — Progress Notes (Signed)
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  INTERVAL HISTORY: Patient returns to clinic today for routine six-month follow-up.  She continues to tolerate letrozole well without significant side effects. Her Parkinson's symptoms continue to be well controlled with her current medication regimen.  She continues to have chronic back pain.  She has no new neurologic complaints.  She denies any recent fevers.  She has a good appetite and denies weight loss.  She denies any chest pain or shortness of breath.  She denies any nausea, vomiting, constipation, or diarrhea.  She has no urinary complaints.  Patient offers no further specific complaints today.   REVIEW OF SYSTEMS:   Review of Systems  Constitutional: Negative.  Negative for fever, malaise/fatigue and weight loss.  Respiratory: Negative.  Negative for cough and shortness of breath.   Cardiovascular: Negative.  Negative for chest pain and leg swelling.  Gastrointestinal: Negative.  Negative for abdominal pain.  Genitourinary: Negative.   Musculoskeletal: Positive for back pain and joint pain.  Skin: Negative.  Negative for rash.  Neurological: Positive for tremors. Negative for sensory change, speech change, focal weakness and weakness.  Psychiatric/Behavioral: Negative.  Negative for depression. The patient is not nervous/anxious.     As per HPI. Otherwise, a complete review of systems is negative.  PAST MEDICAL HISTORY: Past Medical History:  Diagnosis Date  . Anemia   . Arm pain 07/26/2015  . Arthritis   . Arthritis, degenerative 03/26/2014  . Back pain 11/01/2013  . Breast cancer ([employer])    Masectomy - left - 1986   . Breast  cancer [facility])    Mastectomy-right -2014  . CHEST PAIN 04/29/2010   Qualifier: Diagnosis of  By: Wynetta Emery RN, Doroteo Bradford    . Chronic cystitis   . Cystocele 02/22/2013  . Cystocele, midline 08/19/2013  . Degeneration of intervertebral disc of lumbosacral region 03/26/2014  . DYSPNEA 04/29/2010   Qualifier: Diagnosis of  By: Wynetta Emery RN, Doroteo Bradford    . Enthesopathy of hip 03/26/2014  . GERD (gastroesophageal reflux disease)   . Hiatal hernia   . HTN (hypertension)   . LBP (low back pain) 03/26/2014  . Neck pain 11/01/2013  . Parkinson disease ([place])   . Sinusitis 02/07/2015  . Skin lesions 07/12/2014  . Urinary incontinence    mixed     PAST SURGICAL HISTORY: Past Surgical History:  Procedure Laterality Date  . ABDOMINAL HYSTERECTOMY    . BREAST BIOPSY  2013  . BREAST ENHANCEMENT SURGERY  1987  . BREAST IMPLANT REMOVAL    . BREAST IMPLANT REMOVAL Right 08/29/2012  . BREAST SURGERY  1986   s/p left mastectomy  . COLONOSCOPY WITH PROPOFOL N/A 09/13/2016   Procedure: COLONOSCOPY WITH PROPOFOL;  Surgeon: Manya Silvas, MD;  Location: [facility] ENDOSCOPY;  Service: Endoscopy;  Laterality: N/A;  . MASTECTOMY  08/2012   right  . TONSILLECTOMY AND ADENOIDECTOMY  79  . VESICOVAGINAL FISTULA CLOSURE W/ TAH  1983    FAMILY HISTORY Family History  Problem Relation Age of Onset  . Diabetes Father   . Stroke Father   . Colon polyps Father   . Stroke Mother   . Parkinson's disease Mother        ADVANCED DIRECTIVES:  HEALTH MAINTENANCE: Social History   Tobacco Use  . Smoking status: Never Smoker  . Smokeless tobacco: Never Used  . Tobacco comment: tobacco use - no  Substance Use Topics  . Alcohol use: No    Alcohol/week: 0.0 oz  . Drug use: No     Allergies  Allergen Reactions  . Sulfa Antibiotics Nausea Only  . Vesicare [Solifenacin] Other (See Comments)    Constipation Constipation    Current Outpatient Medications  Medication Sig Dispense Refill  . amLODipine (NORVASC) 2.5 MG  tablet TAKE 1 TABLET BY MOUTH ONCE DAILY. 30 tablet 0  . aspirin 81 MG EC tablet Take 81 mg by mouth daily as needed.      . Calcium Carbonate (CALCIUM 600) 1500 MG TABS Take 1 tablet by mouth daily.      . carbidopa-levodopa (SINEMET IR) 25-100 MG tablet Take by mouth.    . citalopram (CELEXA) 20 MG tablet TAKE 1 & 1/2 TABLETS BY MOUTH ONCE DAILY    . cyclobenzaprine (FLEXERIL) 10 MG tablet Take 1 tablet (10 mg total) by mouth at bedtime. 90 tablet 0  . gabapentin (NEURONTIN) 300 MG capsule Take 1 capsule (300 mg total) by mouth 2 (two) times daily. 180 capsule 0  . GLUCOSAMINE SULFATE PO Take by mouth daily.     . hydrochlorothiazide (HYDRODIURIL) 25 MG tablet TAKE 1 TABLET BY MOUTH ONCE DAILY. (PATIENT TAKES 1/2 ONCE DAILY AND EXTRA 1/2 IF NEEDED) 15 tablet 0  . [START ON 06/12/2018] HYDROcodone-acetaminophen (NORCO/VICODIN) 5-325 MG tablet Take 1 tablet by mouth every 8 (eight) hours as needed for severe pain. 90 tablet 0  . letrozole (FEMARA) 2.5 MG tablet TAKE 1 TABLET BY MOUTH ONCE DAILY. 30 tablet 0  . losartan (COZAAR) 100 MG tablet TAKE 1 TABLET BY MOUTH ONCE DAILY. 30 tablet 0  . lubiprostone (AMITIZA) 24 MCG capsule TAKE 1 CAPSULE BY MOUTH ONCE DAILY WITH BREAKFAST.    . magnesium oxide (MAGNESIUM-OXIDE) 400 (241.3 Mg) MG tablet Take 1 tablet (400 mg total) by mouth daily. 90 tablet 0  . meloxicam (MOBIC) 7.5 MG tablet Take 1 tablet (7.5 mg total) by mouth daily. 90 tablet 0  . metoprolol succinate (TOPROL-XL) 25 MG 24 hr tablet TAKE 1 TABLET BY MOUTH TWICE DAILY 60 tablet 0  . Multiple Vitamin (MULTIVITAMIN) tablet Take 1 tablet by mouth daily.    . Omega-3 1000 MG CAPS Take by mouth daily.     Marland Kitchen omeprazole (PRILOSEC) 20 MG capsule TAKE 1 CAPSULE BY MOUTH TWICE DAILY FOR REFLUX. 60 capsule 1  . pantoprazole (PROTONIX) 40 MG tablet TAKE 1 TABLET BY MOUTH ONCE DAILY. 30 tablet 0  . pravastatin (PRAVACHOL) 20 MG tablet TAKE 1 TABLET BY MOUTH ONCE DAILY. 30 tablet 0  . meclizine  (ANTIVERT) 12.5 MG tablet Take 1 tablet (12.5 mg total) by mouth 2 (two) times daily as needed for dizziness. (Patient not taking: Reported on 04/09/2018) 20 tablet 0   No current facility-administered medications for this visit.     OBJECTIVE: Vitals:   04/09/18 1429  BP: (!) 145/74  Pulse: 68  Resp: 18  Temp: 97.9 F (36.6 C)     Body mass index is 31.86 kg/m.    ECOG FS:0 - Asymptomatic  General: Well-developed, well-nourished, no acute distress. Eyes: Pink conjunctiva, anicteric sclera. Breast: Bilateral mastectomy. Lungs: Clear to auscultation bilaterally. Heart: Regular rate and rhythm. No rubs, murmurs, or gallops. Abdomen: Soft, nontender, nondistended. No organomegaly noted, normoactive bowel sounds. Musculoskeletal: No  edema, cyanosis, or clubbing. Neuro: Alert, answering all questions appropriately. Cranial nerves grossly intact. Skin: No rashes or petechiae noted. Psych: Normal affect.  LAB RESULTS:  Lab Results  Component Value Date   NA 133 (L) 03/04/2018   K 4.6 03/04/2018   CL 98 03/04/2018   CO2 28 03/04/2018   GLUCOSE 86 03/04/2018   BUN 20 03/04/2018   CREATININE 1.03 03/04/2018   CALCIUM 9.7 03/04/2018   PROT 7.4 03/04/2018   ALBUMIN 4.3 03/04/2018   AST 18 03/04/2018   ALT 5 03/04/2018   ALKPHOS 30 (L) 03/04/2018   BILITOT 0.5 03/04/2018   GFRNONAA 58 (L) 10/16/2017   GFRAA >60 10/16/2017    Lab Results  Component Value Date   WBC 5.5 09/20/2017   NEUTROABS 3.3 09/20/2017   HGB 12.0 09/20/2017   HCT 35.8 (L) 09/20/2017   MCV 89.4 09/20/2017   PLT 293.0 09/20/2017     STUDIES: Dg C-arm 1-60 Min-no Report  Result Date: 03/28/2018 Fluoroscopy was utilized by the requesting physician.  No radiographic interpretation.    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  PLAN:    1.  Stage IIa ER/PR positive, HER-2 negative adenocarcinoma of the upper inner quadrant  of the right breast: Patient completed adjuvant chemotherapy in January 2014. Patient will complete 5 years of letrozole in May 2019.  She has been instructed to complete her current prescription and then discontinue treatment.  She does not require additional refills.  Patient has had bilateral mastectomies, therefore no further mammograms are necessary.  Return to clinic in 1 year for further evaluation. 2. Osteopenia: Patient's most recent bone mineral density on June 25, 2017 was reported as -2.3 which is unchanged from one year prior.  Patient received 1 dose of Prolia on October 16, 2017.  Continue Calcium and Vitamin D. Repeat in July 2020.  3.  Tremors/Parkinson's: Stable. Treatment per neurology.  Next  Approximately 20 minutes spent in discussion of which greater than 50% consultation   Patient expressed understanding and was in agreement with this plan. She also understands that She can call clinic at any time with any questions, concerns, or complaints.   Breast cancer [facility])   Staging form: Breast, AJCC 7th Edition     Clinical stage from 09/22/2015: Stage IIA (T1c, N1, M0) - Signed by Lloyd Huger, MD on 09/22/2015   Lloyd Huger, MD   04/12/2018 12:04 PM

## 2018-04-09 ENCOUNTER — Inpatient Hospital Stay: Payer: Medicare Other | Attending: Oncology | Admitting: Oncology

## 2018-04-09 ENCOUNTER — Other Ambulatory Visit: Payer: Self-pay

## 2018-04-09 VITALS — BP 145/74 | HR 68 | Temp 97.9°F | Resp 18 | Wt 185.6 lb

## 2018-04-09 DIAGNOSIS — M549 Dorsalgia, unspecified: Secondary | ICD-10-CM | POA: Diagnosis not present

## 2018-04-09 DIAGNOSIS — Z9013 Acquired absence of bilateral breasts and nipples: Secondary | ICD-10-CM | POA: Diagnosis not present

## 2018-04-09 DIAGNOSIS — K219 Gastro-esophageal reflux disease without esophagitis: Secondary | ICD-10-CM | POA: Diagnosis not present

## 2018-04-09 DIAGNOSIS — M8580 Other specified disorders of bone density and structure, unspecified site: Secondary | ICD-10-CM | POA: Insufficient documentation

## 2018-04-09 DIAGNOSIS — M542 Cervicalgia: Secondary | ICD-10-CM | POA: Diagnosis not present

## 2018-04-09 DIAGNOSIS — Z79811 Long term (current) use of aromatase inhibitors: Secondary | ICD-10-CM | POA: Insufficient documentation

## 2018-04-09 DIAGNOSIS — K449 Diaphragmatic hernia without obstruction or gangrene: Secondary | ICD-10-CM | POA: Diagnosis not present

## 2018-04-09 DIAGNOSIS — I10 Essential (primary) hypertension: Secondary | ICD-10-CM | POA: Diagnosis not present

## 2018-04-09 DIAGNOSIS — Z7982 Long term (current) use of aspirin: Secondary | ICD-10-CM | POA: Diagnosis not present

## 2018-04-09 DIAGNOSIS — G8929 Other chronic pain: Secondary | ICD-10-CM | POA: Insufficient documentation

## 2018-04-09 DIAGNOSIS — C50411 Malignant neoplasm of upper-outer quadrant of right female breast: Secondary | ICD-10-CM | POA: Insufficient documentation

## 2018-04-09 DIAGNOSIS — G20 Parkinson's disease: Secondary | ICD-10-CM | POA: Insufficient documentation

## 2018-04-09 DIAGNOSIS — Z79899 Other long term (current) drug therapy: Secondary | ICD-10-CM | POA: Diagnosis not present

## 2018-04-09 DIAGNOSIS — Z170 Estrogen receptor positive status [ER+]: Secondary | ICD-10-CM | POA: Diagnosis not present

## 2018-04-09 DIAGNOSIS — C50211 Malignant neoplasm of upper-inner quadrant of right female breast: Secondary | ICD-10-CM

## 2018-04-09 NOTE — Progress Notes (Signed)
Here for follow up  Stated "doing fine "

## 2018-04-22 ENCOUNTER — Ambulatory Visit: Payer: Medicare Other | Attending: Pain Medicine | Admitting: Pain Medicine

## 2018-04-22 ENCOUNTER — Other Ambulatory Visit: Payer: Self-pay | Admitting: Nurse Practitioner

## 2018-04-22 ENCOUNTER — Encounter: Payer: Self-pay | Admitting: Pain Medicine

## 2018-04-22 ENCOUNTER — Other Ambulatory Visit: Payer: Self-pay | Admitting: Internal Medicine

## 2018-04-22 VITALS — BP 146/77 | HR 70 | Temp 98.4°F | Resp 16 | Ht 64.0 in | Wt 184.0 lb

## 2018-04-22 DIAGNOSIS — Z79811 Long term (current) use of aromatase inhibitors: Secondary | ICD-10-CM | POA: Diagnosis not present

## 2018-04-22 DIAGNOSIS — M7061 Trochanteric bursitis, right hip: Secondary | ICD-10-CM | POA: Diagnosis not present

## 2018-04-22 DIAGNOSIS — Z882 Allergy status to sulfonamides status: Secondary | ICD-10-CM | POA: Insufficient documentation

## 2018-04-22 DIAGNOSIS — M542 Cervicalgia: Secondary | ICD-10-CM | POA: Diagnosis not present

## 2018-04-22 DIAGNOSIS — G894 Chronic pain syndrome: Secondary | ICD-10-CM | POA: Insufficient documentation

## 2018-04-22 DIAGNOSIS — M7062 Trochanteric bursitis, left hip: Secondary | ICD-10-CM | POA: Insufficient documentation

## 2018-04-22 DIAGNOSIS — Z79891 Long term (current) use of opiate analgesic: Secondary | ICD-10-CM | POA: Insufficient documentation

## 2018-04-22 DIAGNOSIS — M25551 Pain in right hip: Secondary | ICD-10-CM

## 2018-04-22 DIAGNOSIS — M4722 Other spondylosis with radiculopathy, cervical region: Secondary | ICD-10-CM | POA: Diagnosis not present

## 2018-04-22 DIAGNOSIS — Z820 Family history of epilepsy and other diseases of the nervous system: Secondary | ICD-10-CM | POA: Diagnosis not present

## 2018-04-22 DIAGNOSIS — M25552 Pain in left hip: Secondary | ICD-10-CM | POA: Diagnosis not present

## 2018-04-22 DIAGNOSIS — M47817 Spondylosis without myelopathy or radiculopathy, lumbosacral region: Secondary | ICD-10-CM | POA: Insufficient documentation

## 2018-04-22 DIAGNOSIS — Z87440 Personal history of urinary (tract) infections: Secondary | ICD-10-CM | POA: Insufficient documentation

## 2018-04-22 DIAGNOSIS — Z833 Family history of diabetes mellitus: Secondary | ICD-10-CM | POA: Diagnosis not present

## 2018-04-22 DIAGNOSIS — Z888 Allergy status to other drugs, medicaments and biological substances status: Secondary | ICD-10-CM | POA: Insufficient documentation

## 2018-04-22 DIAGNOSIS — E669 Obesity, unspecified: Secondary | ICD-10-CM | POA: Diagnosis not present

## 2018-04-22 DIAGNOSIS — N182 Chronic kidney disease, stage 2 (mild): Secondary | ICD-10-CM | POA: Insufficient documentation

## 2018-04-22 DIAGNOSIS — K5909 Other constipation: Secondary | ICD-10-CM | POA: Insufficient documentation

## 2018-04-22 DIAGNOSIS — M7918 Myalgia, other site: Secondary | ICD-10-CM | POA: Diagnosis not present

## 2018-04-22 DIAGNOSIS — M48062 Spinal stenosis, lumbar region with neurogenic claudication: Secondary | ICD-10-CM | POA: Insufficient documentation

## 2018-04-22 DIAGNOSIS — M159 Polyosteoarthritis, unspecified: Secondary | ICD-10-CM

## 2018-04-22 DIAGNOSIS — G8929 Other chronic pain: Secondary | ICD-10-CM | POA: Diagnosis not present

## 2018-04-22 DIAGNOSIS — I10 Essential (primary) hypertension: Secondary | ICD-10-CM | POA: Diagnosis not present

## 2018-04-22 DIAGNOSIS — Z8371 Family history of colonic polyps: Secondary | ICD-10-CM | POA: Diagnosis not present

## 2018-04-22 DIAGNOSIS — Z79899 Other long term (current) drug therapy: Secondary | ICD-10-CM | POA: Insufficient documentation

## 2018-04-22 DIAGNOSIS — M150 Primary generalized (osteo)arthritis: Secondary | ICD-10-CM

## 2018-04-22 DIAGNOSIS — M47816 Spondylosis without myelopathy or radiculopathy, lumbar region: Secondary | ICD-10-CM

## 2018-04-22 DIAGNOSIS — N3946 Mixed incontinence: Secondary | ICD-10-CM | POA: Insufficient documentation

## 2018-04-22 DIAGNOSIS — M545 Low back pain: Secondary | ICD-10-CM | POA: Diagnosis not present

## 2018-04-22 DIAGNOSIS — E7800 Pure hypercholesterolemia, unspecified: Secondary | ICD-10-CM | POA: Insufficient documentation

## 2018-04-22 DIAGNOSIS — M533 Sacrococcygeal disorders, not elsewhere classified: Secondary | ICD-10-CM | POA: Insufficient documentation

## 2018-04-22 DIAGNOSIS — Z6831 Body mass index (BMI) 31.0-31.9, adult: Secondary | ICD-10-CM | POA: Diagnosis not present

## 2018-04-22 DIAGNOSIS — Z5181 Encounter for therapeutic drug level monitoring: Secondary | ICD-10-CM | POA: Insufficient documentation

## 2018-04-22 DIAGNOSIS — Z791 Long term (current) use of non-steroidal anti-inflammatories (NSAID): Secondary | ICD-10-CM | POA: Insufficient documentation

## 2018-04-22 DIAGNOSIS — Z90710 Acquired absence of both cervix and uterus: Secondary | ICD-10-CM | POA: Insufficient documentation

## 2018-04-22 DIAGNOSIS — M8580 Other specified disorders of bone density and structure, unspecified site: Secondary | ICD-10-CM | POA: Insufficient documentation

## 2018-04-22 DIAGNOSIS — Z823 Family history of stroke: Secondary | ICD-10-CM | POA: Diagnosis not present

## 2018-04-22 DIAGNOSIS — C50211 Malignant neoplasm of upper-inner quadrant of right female breast: Secondary | ICD-10-CM | POA: Insufficient documentation

## 2018-04-22 DIAGNOSIS — Z7982 Long term (current) use of aspirin: Secondary | ICD-10-CM | POA: Insufficient documentation

## 2018-04-22 DIAGNOSIS — M5137 Other intervertebral disc degeneration, lumbosacral region: Secondary | ICD-10-CM | POA: Insufficient documentation

## 2018-04-22 DIAGNOSIS — F329 Major depressive disorder, single episode, unspecified: Secondary | ICD-10-CM | POA: Diagnosis not present

## 2018-04-22 DIAGNOSIS — I129 Hypertensive chronic kidney disease with stage 1 through stage 4 chronic kidney disease, or unspecified chronic kidney disease: Secondary | ICD-10-CM | POA: Insufficient documentation

## 2018-04-22 DIAGNOSIS — M4807 Spinal stenosis, lumbosacral region: Secondary | ICD-10-CM | POA: Insufficient documentation

## 2018-04-22 DIAGNOSIS — G20 Parkinson's disease: Secondary | ICD-10-CM | POA: Diagnosis not present

## 2018-04-22 NOTE — Progress Notes (Signed)
Safety precautions to be maintained throughout the outpatient stay will include: orient to surroundings, keep bed in low position, maintain call bell within reach at all times, provide assistance with transfer out of bed and ambulation.  

## 2018-04-22 NOTE — Progress Notes (Signed)
Patient's Name: Wanda Martinez  MRN: [id number]  Referring Provider: Einar Pheasant, MD  DOB: [birth year]  PCP: Einar Pheasant, MD  DOS: 04/22/2018  Note by: Gaspar Cola, MD  Service setting: Ambulatory outpatient  Specialty: Interventional Pain Management  Location: [facility] (AMB) Pain Management Facility    Patient type: Established   Primary Reason(s) for Visit: Encounter for post-procedure evaluation of chronic illness with mild to moderate exacerbation CC: Hip Pain (bilateral) and Back Pain (lower back more on the right)  HPI  Wanda Martinez is a 83 y.o. year old, female patient, who comes today for a post-procedure evaluation. She has Hypercholesterolemia; Essential hypertension; Frequent UTI; Primary cancer of upper inner quadrant of right female breast ([place]); Tremor; Toenail fungus; Headache; Obesity (BMI 30-39.9); Dysuria; Sinusitis; Parkinsons ([place]); Fatigue; Chronic kidney disease (CKD), stage II (mild); Clinical depression; Incomplete bladder emptying; Mixed incontinence; HLD (hyperlipidemia); Bladder infection, chronic; Parkinson's disease ([facility]); Pure hypercholesterolemia; Hernia, rectovaginal; Long term current use of opiate analgesic; Long term prescription opiate use; Opiate use; Encounter for therapeutic drug level monitoring; Encounter for chronic pain management; Chronic low back pain (Primary Source of Pain) (Bilateral) (R>L); Lumbar spondylosis; Chronic hip pain (Bilateral); Chronic neck pain; Cervical spondylosis; Chronic cervical radicular pain (Right); Chronic upper extremity pain (Right); Osteoarthritis; Diffuse myofascial pain syndrome; Neurogenic pain; Chronic upper back pain (Right); Myofascial pain syndrome (Right) (cervicothoracic); Vaginitis; Lumbar facet syndrome (Bilateral) (R>L); Malignant neoplasm of right female breast ([place]); Chronic constipation; Cervical facet hypertrophy; Cervical facet syndrome ([place]); Chronic shoulder pain (Right); Chronic pain syndrome; Chronic  sacroiliac joint pain (Left); Chronic sacroiliac joint pain (Right); Lumbosacral foraminal stenosis (L5-S1) (Right); Lumbar spinal stenosis (with neurogenic claudication) (L3-4); Chronic lower extremity pain (Secondary Area of Pain) (Right); Chronic lumbar radicular pain (S1) (Right); Osteopenia; Trochanteric bursitis of both hips; Spondylosis without myelopathy or radiculopathy, lumbosacral region; and Trigger point with back pain (Right) on their problem list. Her primarily concern today is the Hip Pain (bilateral) and Back Pain (lower back more on the right)  Pain Assessment: Location: Lower, Left, Right(bilateral hips) Back(hips) Radiating: back pain into right hip Onset: More than a month ago Duration: Chronic pain Quality: Discomfort, Aching Severity: 6 /10 (subjective, self-reported pain score)  Note: Reported level is inconsistent with clinical observations. Clinically the patient looks like a 3/10 A 3/10 is viewed as "Moderate" and described as significantly interfering with activities of daily living (ADL). It becomes difficult to feed, bathe, get dressed, get on and off the toilet or to perform personal hygiene functions. Difficult to get in and out of bed or a chair without assistance. Very distracting. With effort, it can be ignored when deeply involved in activities. Wanda Martinez does not seem to understand the use of our objective pain scale When using our objective Pain Scale, levels between 6 and 10/10 are said to belong in an emergency room, as it progressively worsens from a 6/10, described as severely limiting, requiring emergency care not usually available at an outpatient pain management facility. At a 6/10 level, communication becomes difficult and requires great effort. Assistance to reach the emergency department may be required. Facial flushing and profuse sweating along with potentially dangerous increases in heart rate and blood pressure will be evident. Effect on ADL: can be  limiting at times.  Timing: Constant Modifying factors: rest, medications BP: (!) 146/77  HR: 70  Wanda Martinez comes in today for post-procedure evaluation after the treatment done on 03/28/2018.  Based on the information collected today, it is clear that  the radiofrequency of the lumbar facets done in November 2018 has already worn off and needs to be repeated.  In addition, Dr. Melrose Nakayama has indicated that he would like for the patient to have a bilateral trochanteric bursa injection which we will arrange to do as soon as possible.  Following this, then we will plan on repeating the lumbar facet radiofrequency on the right side.  She was also referred to physical therapy by Dr. Melrose Nakayama for the purpose of attempting to get her more functional.  Further details on both, my assessment(s), as well as the proposed treatment plan, please see below.  Post-Procedure Assessment  03/28/2018 Procedure: Diagnostic right-sided lumbar facet block under fluoroscopic guidance and IV sedation Pre-procedure pain score:  6/10 Post-procedure pain score: 0/10 (100% relief) Influential Factors: BMI: 31.58 kg/m Intra-procedural challenges: None observed.         Assessment challenges: None detected.              Reported side-effects: None.        Post-procedural adverse reactions or complications: None reported         Sedation: Sedation provided. When no sedatives are used, the analgesic levels obtained are directly associated to the effectiveness of the local anesthetics. However, when sedation is provided, the level of analgesia obtained during the initial 1 hour following the intervention, is believed to be the result of a combination of factors. These factors may include, but are not limited to: 1. The effectiveness of the local anesthetics used. 2. The effects of the analgesic(s) and/or anxiolytic(s) used. 3. The degree of discomfort experienced by the patient at the time of the procedure. 4. The patients  ability and reliability in recalling and recording the events. 5. The presence and influence of possible secondary gains and/or psychosocial factors. Reported result: Relief experienced during the 1st hour after the procedure: 100 % (Ultra-Short Term Relief)            Interpretative annotation: Clinically appropriate result. Analgesia during this period is likely to be Local Anesthetic and/or IV Sedative (Analgesic/Anxiolytic) related.          Effects of local anesthetic: The analgesic effects attained during this period are directly associated to the localized infiltration of local anesthetics and therefore cary significant diagnostic value as to the etiological location, or anatomical origin, of the pain. Expected duration of relief is directly dependent on the pharmacodynamics of the local anesthetic used. Long-acting (4-6 hours) anesthetics used.  Reported result: Relief during the next 4 to 6 hour after the procedure: 100 % (Short-Term Relief)            Interpretative annotation: Clinically appropriate result. Analgesia during this period is likely to be Local Anesthetic-related.          Long-term benefit: Defined as the period of time past the expected duration of local anesthetics (1 hour for short-acting and 4-6 hours for long-acting). With the possible exception of prolonged sympathetic blockade from the local anesthetics, benefits during this period are typically attributed to, or associated with, other factors such as analgesic sensory neuropraxia, antiinflammatory effects, or beneficial biochemical changes provided by agents other than the local anesthetics.  Reported result: Extended relief following procedure: 0 %(patient reports that she may have over done it because she overdid it with household actitivites. ) (Long-Term Relief)            Interpretative annotation: Clinically possible results. Recurrence of symptoms. No permanent benefit expected. No significant inflammatory component  detected.  Current benefits: Defined as reported results that persistent at this point in time.   Analgesia: <25 %            Function: Back to baseline ROM: Back to baseline Interpretative annotation: Recurrence of symptoms. Therapeutic failure. Results would suggest further treatment needed.          Interpretation: Results would suggest a successful diagnostic intervention. Possible adjustment in plan of care may be required The patient is currently being referred back to physical therapy by Dr. Melrose Nakayama.  Plan:  Proceed with Radiofrequency Ablation for the purpose of attaining long-term benefits. The patient has last radiofrequency on the right side was around November of last year, meaning approximately 6 to 7 months ago.Marland Kitchen "The patient has failed to respond to conservative therapies including over-the-counter medications, anti-inflammatories, muscle relaxants, membrane stabilizers, opioids, physical therapy modalities such as heat and ice, as well as more invasive techniques such as nerve blocks. Because Ms. Mcnee did attain more than 50% relief of the pain during a series of diagnostic blocks conducted in separate occasions, I believe it is medically necessary to proceed with Radiofrequency Ablation, in order to attempt gaining longer relief.  Laboratory Chemistry  Renal Function Markers Lab Results  Component Value Date   BUN 20 03/04/2018   CREATININE 1.03 03/04/2018   GFRAA >60 10/16/2017   GFRNONAA 58 (L) 10/16/2017                              Hepatic Function Markers Lab Results  Component Value Date   AST 18 03/04/2018   ALT 5 03/04/2018   ALBUMIN 4.3 03/04/2018   ALKPHOS 30 (L) 03/04/2018                        Electrolytes Lab Results  Component Value Date   NA 133 (L) 03/04/2018   K 4.6 03/04/2018   CL 98 03/04/2018   CALCIUM 9.7 03/04/2018   MG 1.8 04/21/2013                        Neuropathy Markers Lab Results  Component Value Date   [id number]  124 02/01/2016                        Coagulation Parameters Lab Results  Component Value Date   PLT 293.0 09/20/2017                        Cardiovascular Markers Lab Results  Component Value Date   TROPONINI < 0.02 10/09/2014   HGB 12.0 09/20/2017   HCT 35.8 (L) 09/20/2017                         CA Markers Lab Results  Component Value Date   LABCA2 24.5 08/07/2013                        Note: Lab results reviewed.  Recent Diagnostic Imaging Results  DG C-Arm 1-60 Min-No Report Fluoroscopy was utilized by the requesting physician.  No radiographic  interpretation.   Complexity Note: I personally reviewed the fluoroscopic imaging of the procedure.                        Meds   Current Outpatient Medications:  .  amLODipine (NORVASC) 2.5 MG tablet, TAKE 1 TABLET BY MOUTH ONCE DAILY., Disp: 30 tablet, Rfl: 0 .  aspirin 81 MG EC tablet, Take 81 mg by mouth daily as needed.  , Disp: , Rfl:  .  Calcium Carbonate (CALCIUM 600) 1500 MG TABS, Take 1 tablet by mouth daily.  , Disp: , Rfl:  .  citalopram (CELEXA) 20 MG tablet, TAKE 1 & 1/2 TABLETS BY MOUTH ONCE DAILY, Disp: , Rfl:  .  cyclobenzaprine (FLEXERIL) 10 MG tablet, Take 1 tablet (10 mg total) by mouth at bedtime., Disp: 90 tablet, Rfl: 0 .  gabapentin (NEURONTIN) 300 MG capsule, Take 1 capsule (300 mg total) by mouth 2 (two) times daily. (Patient taking differently: Take 200 mg by mouth 4 (four) times daily. ), Disp: 180 capsule, Rfl: 0 .  GLUCOSAMINE SULFATE PO, Take by mouth daily. , Disp: , Rfl:  .  hydrochlorothiazide (HYDRODIURIL) 25 MG tablet, TAKE 1 TABLET BY MOUTH ONCE DAILY. (PATIENT TAKES 1/2 ONCE DAILY AND EXTRA 1/2 IF NEEDED), Disp: 15 tablet, Rfl: 0 .  [START ON 06/12/2018] HYDROcodone-acetaminophen (NORCO/VICODIN) 5-325 MG tablet, Take 1 tablet by mouth every 8 (eight) hours as needed for severe pain., Disp: 90 tablet, Rfl: 0 .  letrozole (FEMARA) 2.5 MG tablet, TAKE 1 TABLET BY MOUTH ONCE DAILY., Disp: 30  tablet, Rfl: 0 .  losartan (COZAAR) 100 MG tablet, TAKE 1 TABLET BY MOUTH ONCE DAILY., Disp: 30 tablet, Rfl: 0 .  lubiprostone (AMITIZA) 24 MCG capsule, TAKE 1 CAPSULE BY MOUTH ONCE DAILY WITH BREAKFAST., Disp: , Rfl:  .  magnesium oxide (MAGNESIUM-OXIDE) 400 (241.3 Mg) MG tablet, Take 1 tablet (400 mg total) by mouth daily., Disp: 90 tablet, Rfl: 0 .  meloxicam (MOBIC) 7.5 MG tablet, Take 1 tablet (7.5 mg total) by mouth daily., Disp: 90 tablet, Rfl: 0 .  metoprolol succinate (TOPROL-XL) 25 MG 24 hr tablet, TAKE 1 TABLET BY MOUTH TWICE DAILY, Disp: 60 tablet, Rfl: 0 .  Multiple Vitamin (MULTIVITAMIN) tablet, Take 1 tablet by mouth daily., Disp: , Rfl:  .  Omega-3 1000 MG CAPS, Take by mouth daily. , Disp: , Rfl:  .  omeprazole (PRILOSEC) 20 MG capsule, TAKE 1 CAPSULE BY MOUTH TWICE DAILY FOR REFLUX., Disp: 60 capsule, Rfl: 1 .  pantoprazole (PROTONIX) 40 MG tablet, TAKE 1 TABLET BY MOUTH ONCE DAILY., Disp: 30 tablet, Rfl: 0 .  pravastatin (PRAVACHOL) 20 MG tablet, TAKE 1 TABLET BY MOUTH ONCE DAILY., Disp: 30 tablet, Rfl: 0  ROS  Constitutional: Denies any fever or chills Gastrointestinal: No reported hemesis, hematochezia, vomiting, or acute GI distress Musculoskeletal: Denies any acute onset joint swelling, redness, loss of ROM, or weakness Neurological: No reported episodes of acute onset apraxia, aphasia, dysarthria, agnosia, amnesia, paralysis, loss of coordination, or loss of consciousness  Allergies  Ms. Branden is allergic to sulfa antibiotics and vesicare [solifenacin].  [place]  Drug: Ms. Mapel  reports that she does not use drugs. Alcohol:  reports that she does not drink alcohol. Tobacco:  reports that she has never smoked. She has never used smokeless tobacco. Medical:  has a past medical history of Anemia, Arm pain (07/26/2015), Arthritis, Arthritis, degenerative (03/26/2014), Back pain (11/01/2013), Breast cancer ([place]), Breast cancer ([place]), CHEST PAIN (04/29/2010), Chronic cystitis,  Cystocele (02/22/2013), Cystocele, midline (08/19/2013), Degeneration of intervertebral disc of lumbosacral region (03/26/2014), DYSPNEA (04/29/2010), Enthesopathy of hip (03/26/2014), GERD (gastroesophageal reflux disease), Hiatal hernia, HTN (hypertension), LBP (low back pain) (03/26/2014), Neck pain (11/01/2013), Parkinson disease ([place]), Sinusitis (02/07/2015), Skin  lesions (07/12/2014), and Urinary incontinence. Surgical: Ms. Mcglade  has a past surgical history that includes Breast biopsy (2013); Tonsillectomy and adenoidectomy (79); Vesicovaginal fistula closure w/ TAH (1983); Breast surgery (1986); Breast enhancement surgery (1987); Breast implant removal; Breast implant removal (Right, 08/29/2012); Mastectomy (08/2012); Abdominal hysterectomy; and Colonoscopy with propofol (N/A, 09/13/2016). Family: family history includes Colon polyps in her father; Diabetes in her father; Parkinson's disease in her mother; Stroke in her father and mother.  Constitutional Exam  General appearance: Well nourished, well developed, and well hydrated. In no apparent acute distress Vitals:   04/22/18 1320  BP: (!) 146/77  Pulse: 70  Resp: 16  Temp: 98.4 F (36.9 C)  TempSrc: Oral  SpO2: 100%  Weight: 184 lb (83.5 kg)  Height: 5\' 4"  (1.626 m)   BMI Assessment: Estimated body mass index is 31.58 kg/m as calculated from the following:   Height as of this encounter: 5\' 4"  (1.626 m).   Weight as of this encounter: 184 lb (83.5 kg).  BMI interpretation table: BMI level Category Range association with higher incidence of chronic pain  <18 kg/m2 Underweight   18.5-24.9 kg/m2 Ideal body weight   25-29.9 kg/m2 Overweight Increased incidence by 20%  30-34.9 kg/m2 Obese (Class I) Increased incidence by 68%  35-39.9 kg/m2 Severe obesity (Class II) Increased incidence by 136%  >40 kg/m2 Extreme obesity (Class III) Increased incidence by 254%   Patient's current BMI Ideal Body weight  Body mass index is 31.58 kg/m. Ideal body  weight: 54.7 kg (120 lb 9.5 oz) Adjusted ideal body weight: 66.2 kg (145 lb 15.3 oz)   BMI Readings from Last 4 Encounters:  04/22/18 31.58 kg/m  04/09/18 31.86 kg/m  03/28/18 30.90 kg/m  03/20/18 30.90 kg/m   Wt Readings from Last 4 Encounters:  04/22/18 184 lb (83.5 kg)  04/09/18 185 lb 9.6 oz (84.2 kg)  03/28/18 180 lb (81.6 kg)  03/20/18 180 lb (81.6 kg)  Psych/Mental status: Alert, oriented x 3 (person, place, & time)       Eyes: PERLA Respiratory: No evidence of acute respiratory distress  Cervical Spine Area Exam  Skin & Axial Inspection: No masses, redness, edema, swelling, or associated skin lesions Alignment: Symmetrical Functional ROM: Unrestricted ROM      Stability: No instability detected Muscle Tone/Strength: Functionally intact. No obvious neuro-muscular anomalies detected. Sensory (Neurological): Unimpaired Palpation: No palpable anomalies              Upper Extremity (UE) Exam    Side: Right upper extremity  Side: Left upper extremity  Skin & Extremity Inspection: Skin color, temperature, and hair growth are WNL. No peripheral edema or cyanosis. No masses, redness, swelling, asymmetry, or associated skin lesions. No contractures.  Skin & Extremity Inspection: Skin color, temperature, and hair growth are WNL. No peripheral edema or cyanosis. No masses, redness, swelling, asymmetry, or associated skin lesions. No contractures.  Functional ROM: Unrestricted ROM          Functional ROM: Unrestricted ROM          Muscle Tone/Strength: Functionally intact. No obvious neuro-muscular anomalies detected.  Muscle Tone/Strength: Functionally intact. No obvious neuro-muscular anomalies detected.  Sensory (Neurological): Unimpaired          Sensory (Neurological): Unimpaired          Palpation: No palpable anomalies              Palpation: No palpable anomalies  Specialized Test(s): Deferred         Specialized Test(s): Deferred          Thoracic Spine Area  Exam  Skin & Axial Inspection: No masses, redness, or swelling Alignment: Symmetrical Functional ROM: Unrestricted ROM Stability: No instability detected Muscle Tone/Strength: Functionally intact. No obvious neuro-muscular anomalies detected. Sensory (Neurological): Unimpaired Muscle strength & Tone: No palpable anomalies  Lumbar Spine Area Exam  Skin & Axial Inspection: No masses, redness, or swelling Alignment: Symmetrical Functional ROM: Decreased ROM       Stability: No instability detected Muscle Tone/Strength: Functionally intact. No obvious neuro-muscular anomalies detected. Sensory (Neurological): Movement-associated pain Palpation: Complains of area being tender to palpation       Provocative Tests: Lumbar Hyperextension and rotation test: Positive bilaterally for facet joint pain. Lumbar Lateral bending test: evaluation deferred today       Patrick's Maneuver: evaluation deferred today                    Gait & Posture Assessment  Ambulation: Limited Gait: Relatively normal for age and body habitus Posture: WNL   Lower Extremity Exam    Side: Right lower extremity  Side: Left lower extremity  Stability: No instability observed          Stability: No instability observed          Skin & Extremity Inspection: Skin color, temperature, and hair growth are WNL. No peripheral edema or cyanosis. No masses, redness, swelling, asymmetry, or associated skin lesions. No contractures.  Skin & Extremity Inspection: Skin color, temperature, and hair growth are WNL. No peripheral edema or cyanosis. No masses, redness, swelling, asymmetry, or associated skin lesions. No contractures.  Functional ROM: Unrestricted ROM                  Functional ROM: Unrestricted ROM                  Muscle Tone/Strength: Functionally intact. No obvious neuro-muscular anomalies detected.  Muscle Tone/Strength: Functionally intact. No obvious neuro-muscular anomalies detected.  Sensory (Neurological):  Unimpaired  Sensory (Neurological): Unimpaired  Palpation: No palpable anomalies  Palpation: No palpable anomalies   Assessment  Primary Diagnosis & Pertinent Problem List: The primary encounter diagnosis was Chronic low back pain (Primary Source of Pain) (Bilateral) (R>L). Diagnoses of Spondylosis without myelopathy or radiculopathy, lumbosacral region, Lumbar facet syndrome (Bilateral) (R>L), Trochanteric bursitis of both hips, Chronic hip pain (Bilateral), and Chronic pain syndrome were also pertinent to this visit.  Status Diagnosis  Recurring Persistent Recurring 1. Chronic low back pain (Primary Source of Pain) (Bilateral) (R>L)   2. Spondylosis without myelopathy or radiculopathy, lumbosacral region   3. Lumbar facet syndrome (Bilateral) (R>L)   4. Trochanteric bursitis of both hips   5. Chronic hip pain (Bilateral)   6. Chronic pain syndrome     Problems updated and reviewed during this visit: Problem  Trochanteric Bursitis of Both Hips  Chronic hip pain (Bilateral)   Plan of Care  Pharmacotherapy (Medications Ordered): No orders of the defined types were placed in this encounter.  Medications administered today: Catalia T. [employer]" had no medications administered during this visit.   Procedure Orders     HIP INJECTION     Radiofrequency,Lumbar Lab Orders  No laboratory test(s) ordered today   Imaging Orders  No imaging studies ordered today   Referral Orders  No referral(s) requested today    Interventional management options: Planned, scheduled, and/or pending:  Diagnostic bilateral trochanteric bursa injection  Palliative right-sided lumbar facet RFA #2    Considering:   Diagnosticright-sided lumbar facet block  Palliative right-sided lumbar facet radiofrequency ablation  PalliativeRight suprascapular muscle trigger point injection  Diagnosticbilateral cervical facet block  Possiblebilateral cervical facet radiofrequency ablation  Diagnostic  right-sided cervical epidural steroid injection  Diagnosticintra-articular hip joint injection  Possible hip joint radiofrequency ablation   Palliative PRN treatment(s):   Diagnostic right-sided lumbar facet block  Palliativeright-sided lumbar facet radiofrequency ablation  Palliative Right suprascapular muscle trigger point injection  Diagnosticbilateral cervical facet block  Diagnosticright-sided cervical epidural steroid injection Diagnosticintra-articular hip joint injection   Provider-requested follow-up: Return for Procedure (w/ sedation): (B) TBI ; followed by (R) L-FCT RFA.  Future Appointments  Date Time Provider [place]  04/30/2018  1:00 PM Milinda Pointer, MD [facility] None  06/04/2018 10:30 AM Milinda Pointer, MD [facility] None  06/25/2018  1:00 PM [facility] DEXA 1 [facility]  07/03/2018 11:00 AM Vevelyn Francois, NP [facility] None  07/09/2018  9:30 AM Einar Pheasant, MD [facility]  04/08/2019 11:15 AM Lloyd Huger, MD [facility] None   Primary Care Physician: Einar Pheasant, MD Location: [facility] Note by: Gaspar Cola, MD Date: 04/22/2018; Time: 2:18 PM

## 2018-04-22 NOTE — Patient Instructions (Addendum)
____________________________________________________________________________________________  Preparing for Procedure with Sedation  Instructions: . Oral Intake: Do not eat or drink anything for at least 8 hours prior to your procedure. . Transportation: Public transportation is not allowed. Bring an adult driver. The driver must be physically present in our waiting room before any procedure can be started. Marland Kitchen Physical Assistance: Bring an adult physically capable of assisting you, in the event you need help. This adult should keep you company at home for at least 6 hours after the procedure. . Blood Pressure Medicine: Take your blood pressure medicine with a sip of water the morning of the procedure. . Blood thinners:  . Diabetics on insulin: Notify the staff so that you can be scheduled 1st case in the morning. If your diabetes requires high dose insulin, take only  of your normal insulin dose the morning of the procedure and notify the staff that you have done so. . Preventing infections: Shower with an antibacterial soap the morning of your procedure. . Build-up your immune system: Take 1000 mg of Vitamin C with every meal (3 times a day) the day prior to your procedure. Marland Kitchen Antibiotics: Inform the staff if you have a condition or reason that requires you to take antibiotics before dental procedures. . Pregnancy: If you are pregnant, call and cancel the procedure. . Sickness: If you have a cold, fever, or any active infections, call and cancel the procedure. . Arrival: You must be in the facility at least 30 minutes prior to your scheduled procedure. . Children: Do not bring children with you. . Dress appropriately: Bring dark clothing that you would not mind if they get stained. . Valuables: Do not bring any jewelry or valuables.  Procedure appointments are reserved for interventional treatments only. Marland Kitchen No Prescription Refills. . No medication changes will be discussed during procedure  appointments. . No disability issues will be discussed.  Remember:  Regular Business hours are:  Monday to Thursday 8:00 AM to 4:00 PM  Provider's Schedule: Milinda Pointer, MD:  Procedure days: Tuesday and Thursday 7:30 AM to 4:00 PM  Gillis Santa, MD:  Procedure days: Monday and Wednesday 7:30 AM to 4:00 PM ____________________________________________________________________________________________   ____________________________________________________________________________________________  General Risks and Possible Complications  Patient Responsibilities: It is important that you read this as it is part of your informed consent. It is our duty to inform you of the risks and possible complications associated with treatments offered to you. It is your responsibility as a patient to read this and to ask questions about anything that is not clear or that you believe was not covered in this document.  Patient's Rights: You have the right to refuse treatment. You also have the right to change your mind, even after initially having agreed to have the treatment done. However, under this last option, if you wait until the last second to change your mind, you may be charged for the materials used up to that point.  Introduction: Medicine is not an Chief Strategy Officer. Everything in Medicine, including the lack of treatment(s), carries the potential for danger, harm, or loss (which is by definition: Risk). In Medicine, a complication is a secondary problem, condition, or disease that can aggravate an already existing one. All treatments carry the risk of possible complications. The fact that a side effects or complications occurs, does not imply that the treatment was conducted incorrectly. It must be clearly understood that these can happen even when everything is done following the highest safety standards.  No treatment:  You can choose not to proceed with the proposed treatment alternative. The  "PRO(s)" would include: avoiding the risk of complications associated with the therapy. The "CON(s)" would include: not getting any of the treatment benefits. These benefits fall under one of three categories: diagnostic; therapeutic; and/or palliative. Diagnostic benefits include: getting information which can ultimately lead to improvement of the disease or symptom(s). Therapeutic benefits are those associated with the successful treatment of the disease. Finally, palliative benefits are those related to the decrease of the primary symptoms, without necessarily curing the condition (example: decreasing the pain from a flare-up of a chronic condition, such as incurable terminal cancer).  General Risks and Complications: These are associated to most interventional treatments. They can occur alone, or in combination. They fall under one of the following six (6) categories: no benefit or worsening of symptoms; bleeding; infection; nerve damage; allergic reactions; and/or death. 1. No benefits or worsening of symptoms: In Medicine there are no guarantees, only probabilities. No healthcare provider can ever guarantee that a medical treatment will work, they can only state the probability that it may. Furthermore, there is always the possibility that the condition may worsen, either directly, or indirectly, as a consequence of the treatment. 2. Bleeding: This is more common if the patient is taking a blood thinner, either prescription or over the counter (example: Goody Powders, Fish oil, Aspirin, Garlic, etc.), or if suffering a condition associated with impaired coagulation (example: Hemophilia, cirrhosis of the liver, low platelet counts, etc.). However, even if you do not have one on these, it can still happen. If you have any of these conditions, or take one of these drugs, make sure to notify your treating physician. 3. Infection: This is more common in patients with a compromised immune system, either due to  disease (example: diabetes, cancer, human immunodeficiency virus [HIV], etc.), or due to medications or treatments (example: therapies used to treat cancer and rheumatological diseases). However, even if you do not have one on these, it can still happen. If you have any of these conditions, or take one of these drugs, make sure to notify your treating physician. 4. Nerve Damage: This is more common when the treatment is an invasive one, but it can also happen with the use of medications, such as those used in the treatment of cancer. The damage can occur to small secondary nerves, or to large primary ones, such as those in the spinal cord and brain. This damage may be temporary or permanent and it may lead to impairments that can range from temporary numbness to permanent paralysis and/or brain death. 5. Allergic Reactions: Any time a substance or material comes in contact with our body, there is the possibility of an allergic reaction. These can range from a mild skin rash (contact dermatitis) to a severe systemic reaction (anaphylactic reaction), which can result in death. 6. Death: In general, any medical intervention can result in death, most of the time due to an unforeseen complication. ____________________________________________________________________________________________

## 2018-04-30 ENCOUNTER — Encounter: Payer: Self-pay | Admitting: Pain Medicine

## 2018-04-30 ENCOUNTER — Ambulatory Visit (HOSPITAL_BASED_OUTPATIENT_CLINIC_OR_DEPARTMENT_OTHER): Payer: Medicare Other | Admitting: Pain Medicine

## 2018-04-30 ENCOUNTER — Ambulatory Visit
Admission: RE | Admit: 2018-04-30 | Discharge: 2018-04-30 | Disposition: A | Discharge status: Home / Self Care | Payer: Medicare Other | Source: Ambulatory Visit | Attending: Pain Medicine | Admitting: Pain Medicine

## 2018-04-30 ENCOUNTER — Other Ambulatory Visit: Payer: Self-pay

## 2018-04-30 VITALS — BP 161/80 | HR 64 | Temp 98.2°F | Resp 13 | Ht 64.0 in | Wt 180.0 lb

## 2018-04-30 DIAGNOSIS — M25551 Pain in right hip: Secondary | ICD-10-CM

## 2018-04-30 DIAGNOSIS — Z9889 Other specified postprocedural states: Secondary | ICD-10-CM | POA: Insufficient documentation

## 2018-04-30 DIAGNOSIS — Z79899 Other long term (current) drug therapy: Secondary | ICD-10-CM | POA: Diagnosis not present

## 2018-04-30 DIAGNOSIS — M7062 Trochanteric bursitis, left hip: Secondary | ICD-10-CM

## 2018-04-30 DIAGNOSIS — Z9010 Acquired absence of unspecified breast and nipple: Secondary | ICD-10-CM | POA: Insufficient documentation

## 2018-04-30 DIAGNOSIS — M25552 Pain in left hip: Secondary | ICD-10-CM

## 2018-04-30 DIAGNOSIS — Z79891 Long term (current) use of opiate analgesic: Secondary | ICD-10-CM | POA: Insufficient documentation

## 2018-04-30 DIAGNOSIS — Z882 Allergy status to sulfonamides status: Secondary | ICD-10-CM | POA: Diagnosis not present

## 2018-04-30 DIAGNOSIS — G8929 Other chronic pain: Secondary | ICD-10-CM | POA: Insufficient documentation

## 2018-04-30 DIAGNOSIS — Z7982 Long term (current) use of aspirin: Secondary | ICD-10-CM | POA: Diagnosis not present

## 2018-04-30 DIAGNOSIS — Z90710 Acquired absence of both cervix and uterus: Secondary | ICD-10-CM | POA: Insufficient documentation

## 2018-04-30 DIAGNOSIS — M7061 Trochanteric bursitis, right hip: Secondary | ICD-10-CM | POA: Insufficient documentation

## 2018-04-30 DIAGNOSIS — Z888 Allergy status to other drugs, medicaments and biological substances status: Secondary | ICD-10-CM | POA: Diagnosis not present

## 2018-04-30 MED ORDER — METHYLPREDNISOLONE ACETATE 80 MG/ML IJ SUSP
80.0000 mg | Freq: Once | INTRAMUSCULAR | Status: AC
  Administered 2018-04-30: 80 mg via INTRA_ARTICULAR
  Filled 2018-04-30: qty 1

## 2018-04-30 MED ORDER — MIDAZOLAM HCL 5 MG/5ML IJ SOLN: 1.0000 mg | INTRAMUSCULAR | Status: DC | PRN

## 2018-04-30 MED ORDER — LIDOCAINE HCL 2 % IJ SOLN
20.0000 mL | Freq: Once | INTRAMUSCULAR | Status: AC
  Administered 2018-04-30: 400 mg
  Filled 2018-04-30: qty 40

## 2018-04-30 MED ORDER — LACTATED RINGERS IV SOLN: 1000.0000 mL | Freq: Once | INTRAVENOUS | Status: DC

## 2018-04-30 MED ORDER — FENTANYL CITRATE (PF) 100 MCG/2ML IJ SOLN: 25.0000 ug | INTRAMUSCULAR | Status: DC | PRN

## 2018-04-30 MED ORDER — ROPIVACAINE HCL 2 MG/ML IJ SOLN
9.0000 mL | Freq: Once | INTRAMUSCULAR | Status: AC
  Administered 2018-04-30: 9 mL via INTRA_ARTICULAR
  Filled 2018-04-30: qty 10

## 2018-04-30 NOTE — Progress Notes (Signed)
Safety precautions to be maintained throughout the outpatient stay will include: orient to surroundings, keep bed in low position, maintain call bell within reach at all times, provide assistance with transfer out of bed and ambulation.  

## 2018-04-30 NOTE — Patient Instructions (Addendum)

## 2018-04-30 NOTE — Progress Notes (Signed)
Patient's Name: Wanda Martinez  MRN: [id number]  Referring Provider: Einar Pheasant, MD  DOB: 16-Sep-1935  PCP: Einar Pheasant, MD  DOS: 04/30/2018  Note by: Gaspar Cola, MD  Service setting: Ambulatory outpatient  Specialty: Interventional Pain Management  Patient type: Established  Location: [facility] (AMB) Pain Management Facility  Visit type: Interventional Procedure   Primary Reason for Visit: Interventional Pain Management Treatment. CC: Hip Pain (bilateral, worse in right)  Procedure:       Anesthesia, Analgesia, Anxiolysis:  Type: Superficial Trochanteric Bursa Injection #1  Primary Purpose: Diagnostic Region: Upper (proximal) Femoral Region Level: Hip Joint Target Area: Superior aspect of the hip joint cavity, going thru the superior portion of the capsular ligament. Approach: Posterolateral approach Laterality: Bilateral Position: Prone  Type: Moderate (Conscious) Sedation combined with Local Anesthesia Indication(s): Analgesia and Anxiety Local Anesthetic: Lidocaine 1-2% Route: Intravenous (IV) IV Access: Secured Sedation: Meaningful verbal contact was maintained at all times during the procedure    Indications: 1. Trochanteric bursitis of hip (Bilateral)   2. Chronic hip pain (Bilateral)    Pain Score: Pre-procedure: 6 /10 Post-procedure: 0-No pain/10  Pre-op Assessment:  [name] is a 83 y.o. (year old), female patient, seen today for interventional treatment. She  has a past surgical history that includes Breast biopsy (2013); Tonsillectomy and adenoidectomy (79); Vesicovaginal fistula closure w/ TAH (1983); Breast surgery (1986); Breast enhancement surgery (1987); Breast implant removal; Breast implant removal (Right, 08/29/2012); Mastectomy (08/2012); Abdominal hysterectomy; and Colonoscopy with propofol (N/A, 09/13/2016). [name] has a current medication list which includes the following prescription(s): amlodipine, aspirin, calcium carbonate, citalopram,  cyclobenzaprine, gabapentin, glucosamine sulfate, hydrochlorothiazide, hydrocodone-acetaminophen, letrozole, losartan, lubiprostone, magnesium oxide, meloxicam, metoprolol succinate, multivitamin, omega-3, omeprazole, pantoprazole, and pravastatin, and the following Facility-Administered Medications: fentanyl, lactated ringers, and midazolam. Her primarily concern today is the Hip Pain (bilateral, worse in right)  Initial Vital Signs:  Pulse/HCG Rate: 64ECG Heart Rate: 65 Temp: 98.2 F (36.8 C) Resp: 16 BP: (!) 145/89 SpO2: 99 %  BMI: Estimated body mass index is 30.9 kg/m as calculated from the following:   Height as of this encounter: 5\' 4"  (1.626 m).   Weight as of this encounter: 180 lb (81.6 kg).  Risk Assessment: Allergies: Reviewed. She is allergic to sulfa antibiotics and vesicare [solifenacin].  Allergy Precautions: None required Coagulopathies: Reviewed. None identified.  Blood-thinner therapy: None at this time Active Infection(s): Reviewed. None identified. [name] is afebrile  Site Confirmation: [name] was asked to confirm the procedure and laterality before marking the site Procedure checklist: Completed Consent: Before the procedure and under the influence of no sedative(s), amnesic(s), or anxiolytics, the patient was informed of the treatment options, risks and possible complications. To fulfill our ethical and legal obligations, as recommended by the American Medical Association's Code of Ethics, I have informed the patient of my clinical impression; the nature and purpose of the treatment or procedure; the risks, benefits, and possible complications of the intervention; the alternatives, including doing nothing; the risk(s) and benefit(s) of the alternative treatment(s) or procedure(s); and the risk(s) and benefit(s) of doing nothing. The patient was provided information about the general risks and possible complications associated with the procedure. These may  include, but are not limited to: failure to achieve desired goals, infection, bleeding, organ or nerve damage, allergic reactions, paralysis, and death. In addition, the patient was informed of those risks and complications associated to the procedure, such as failure to decrease pain; infection; bleeding; organ or nerve damage with  subsequent damage to sensory, motor, and/or autonomic systems, resulting in permanent pain, numbness, and/or weakness of one or several areas of the body; allergic reactions; (i.e.: anaphylactic reaction); and/or death. Furthermore, the patient was informed of those risks and complications associated with the medications. These include, but are not limited to: allergic reactions (i.e.: anaphylactic or anaphylactoid reaction(s)); adrenal axis suppression; blood sugar elevation that in diabetics may result in ketoacidosis or comma; water retention that in patients with history of congestive heart failure may result in shortness of breath, pulmonary edema, and decompensation with resultant heart failure; weight gain; swelling or edema; medication-induced neural toxicity; particulate matter embolism and blood vessel occlusion with resultant organ, and/or nervous system infarction; and/or aseptic necrosis of one or more joints. Finally, the patient was informed that Medicine is not an exact science; therefore, there is also the possibility of unforeseen or unpredictable risks and/or possible complications that may result in a catastrophic outcome. The patient indicated having understood very clearly. We have given the patient no guarantees and we have made no promises. Enough time was given to the patient to ask questions, all of which were answered to the patient's satisfaction. [name] has indicated that she wanted to continue with the procedure. Attestation: I, the ordering provider, attest that I have discussed with the patient the benefits, risks, side-effects, alternatives,  likelihood of achieving goals, and potential problems during recovery for the procedure that I have provided informed consent. Date  Time: 04/30/2018  1:04 PM  Pre-Procedure Preparation:  Monitoring: As per clinic protocol. Respiration, ETCO2, SpO2, BP, heart rate and rhythm monitor placed and checked for adequate function Safety Precautions: Patient was assessed for positional comfort and pressure points before starting the procedure. Time-out: I initiated and conducted the "Time-out" before starting the procedure, as per protocol. The patient was asked to participate by confirming the accuracy of the "Time Out" information. Verification of the correct person, site, and procedure were performed and confirmed by me, the nursing staff, and the patient. "Time-out" conducted as per Joint Commission's Universal Protocol (UP.01.01.01). Time: 1338  Description of Procedure Process:  Area Prepped: Entire Posterolateral hip area. Prepping solution: ChloraPrep (2% chlorhexidine gluconate and 70% isopropyl alcohol) Safety Precautions: Aspiration looking for blood return was conducted prior to all injections. At no point did we inject any substances, as a needle was being advanced. No attempts were made at seeking any paresthesias. Safe injection practices and needle disposal techniques used. Medications properly checked for expiration dates. SDV (single dose vial) medications used. Description of the Procedure: Protocol guidelines were followed. The patient was placed in position over the procedure table. The target area was identified and the area prepped in the usual manner. Skin desensitized using vapocoolant spray. Skin & deeper tissues infiltrated with local anesthetic. Appropriate amount of time allowed to pass for local anesthetics to take effect. The procedure needles were then advanced to the target area. Proper needle placement secured. Negative aspiration confirmed. Solution injected in intermittent  fashion, asking for systemic symptoms every 0.5cc of injectate. The needles were then removed and the area cleansed, making sure to leave some of the prepping solution back to take advantage of its long term bactericidal properties. Vitals:   04/30/18 1304 04/30/18 1332 04/30/18 1338 04/30/18 1341  BP: (!) 145/89 (!) 154/92 (!) 154/84 (!) 161/80  Pulse: 64     Resp: 16 18 12 13   Temp: 98.2 F (36.8 C)     TempSrc: Oral     SpO2: 99% 99%  99% 100%  Weight: 180 lb (81.6 kg)     Height: 5\' 4"  (1.626 m)       Start Time: 1338 hrs. End Time: 1341 hrs.  Materials:  Needle(s) Type: Regular needle Gauge: 22G Length: 3.5-in Medication(s): Please see orders for medications and dosing details.  Imaging Guidance (Non-Spinal):  Type of Imaging Technique: Fluoroscopy Guidance (Non-Spinal) Indication(s): Assistance in needle guidance and placement for procedures requiring needle placement in or near specific anatomical locations not easily accessible without such assistance. Exposure Time: Please see nurses notes. Contrast: Before injecting any contrast, we confirmed that the patient did not have an allergy to iodine, shellfish, or radiological contrast. Once satisfactory needle placement was completed at the desired level, radiological contrast was injected. Contrast injected under live fluoroscopy. No contrast complications. See chart for type and volume of contrast used. Fluoroscopic Guidance: I was personally present during the use of fluoroscopy. "Tunnel Vision Technique" used to obtain the best possible view of the target area. Parallax error corrected before commencing the procedure. "Direction-depth-direction" technique used to introduce the needle under continuous pulsed fluoroscopy. Once target was reached, antero-posterior, oblique, and lateral fluoroscopic projection used confirm needle placement in all planes. Images permanently stored in EMR. Interpretation: I personally interpreted the  imaging intraoperatively. Adequate needle placement confirmed in multiple planes. Appropriate spread of contrast into desired area was observed. No evidence of afferent or efferent intravascular uptake. Permanent images saved into the patient's record.  Antibiotic Prophylaxis:   Anti-infectives (From admission, onward)   None     Indication(s): None identified  Post-operative Assessment:  Post-procedure Vital Signs:  Pulse/HCG Rate: 6466 Temp: 98.2 F (36.8 C) Resp: 13 BP: (!) 161/80 SpO2: 100 %  EBL: None  Complications: No immediate post-treatment complications observed by team, or reported by patient.  Note: The patient tolerated the entire procedure well. A repeat set of vitals were taken after the procedure and the patient was kept under observation following institutional policy, for this type of procedure. Post-procedural neurological assessment was performed, showing return to baseline, prior to discharge. The patient was provided with post-procedure discharge instructions, including a section on how to identify potential problems. Should any problems arise concerning this procedure, the patient was given instructions to immediately contact us, at any time, without hesitation. In any case, we plan to contact the patient by telephone for a follow-up status report regarding this interventional procedure.  Comments:  No additional relevant information.  Plan of Care    Imaging Orders     DG C-Arm 1-60 Min-No Report  Procedure Orders     HIP INJECTION  Medications ordered for procedure: Meds ordered this encounter  Medications  . lidocaine (XYLOCAINE) 2 % (with pres) injection 400 mg  . midazolam (VERSED) 5 MG/5ML injection 1-2 mg    Make sure Flumazenil is available in the pyxis when using this medication. If oversedation occurs, administer 0.2 mg IV over 15 sec. If after 45 sec no response, administer 0.2 mg again over 1 min; may repeat at 1 min intervals; not to exceed  4 doses (1 mg)  . fentaNYL (SUBLIMAZE) injection 25-50 mcg    Make sure Narcan is available in the pyxis when using this medication. In the event of respiratory depression (RR< 8/min): Titrate NARCAN (naloxone) in increments of 0.1 to 0.2 mg IV at 2-3 minute intervals, until desired degree of reversal.  . lactated ringers infusion 1,000 mL  . methylPREDNISolone acetate (DEPO-MEDROL) injection 80 mg  . ropivacaine (PF) 2 mg/mL (  0.2%) (NAROPIN) injection 9 mL   Medications administered: We administered lidocaine, methylPREDNISolone acetate, and ropivacaine (PF) 2 mg/mL (0.2%).  See the medical record for exact dosing, route, and time of administration.  New Prescriptions   No medications on file   Disposition: Discharge home  Discharge Date & Time: 04/30/2018; 1350 hrs.   Physician-requested Follow-up: Return for post-procedure eval (2 wks), w/ Dr. Dossie Arbour.  Future Appointments  Date Time Provider [place]  05/27/2018 10:15 AM Milinda Pointer, MD [facility] None  06/04/2018 10:30 AM Milinda Pointer, MD [facility] None  06/25/2018  1:00 PM [facility]  07/03/2018 11:00 AM Vevelyn Francois, NP [facility] None  07/09/2018  9:30 AM Einar Pheasant, MD [facility]  04/08/2019 11:15 AM Lloyd Huger, MD [facility] None   Primary Care Physician: Einar Pheasant, MD Location: [facility] Note by: Gaspar Cola, MD Date: 04/30/2018; Time: 1:55 PM  Disclaimer:  Medicine is not an Chief Strategy Officer. The only guarantee in medicine is that nothing is guaranteed. It is important to note that the decision to proceed with this intervention was based on the information collected from the patient. The Data and conclusions were drawn from the patient's questionnaire, the interview, and the physical examination. Because the information was provided in large part by the patient, it cannot be guaranteed that it has not been purposely or  unconsciously manipulated. Every effort has been made to obtain as much relevant data as possible for this evaluation. It is important to note that the conclusions that lead to this procedure are derived in large part from the available data. Always take into account that the treatment will also be dependent on availability of resources and existing treatment guidelines, considered by other Pain Management Practitioners as being common knowledge and practice, at the time of the intervention. For Medico-Legal purposes, it is also important to point out that variation in procedural techniques and pharmacological choices are the acceptable norm. The indications, contraindications, technique, and results of the above procedure should only be interpreted and judged by a Board-Certified Interventional Pain Specialist with extensive familiarity and expertise in the same exact procedure and technique.

## 2018-05-01 ENCOUNTER — Telehealth: Payer: Self-pay

## 2018-05-01 DIAGNOSIS — M6281 Muscle weakness (generalized): Secondary | ICD-10-CM | POA: Diagnosis not present

## 2018-05-01 DIAGNOSIS — M545 Low back pain: Secondary | ICD-10-CM | POA: Diagnosis not present

## 2018-05-01 NOTE — Telephone Encounter (Signed)
Post procedure phone call.  Patient states she is doing well.  

## 2018-05-07 ENCOUNTER — Telehealth: Payer: Self-pay | Admitting: Pain Medicine

## 2018-05-07 ENCOUNTER — Other Ambulatory Visit: Payer: Self-pay | Admitting: Nurse Practitioner

## 2018-05-07 DIAGNOSIS — M48062 Spinal stenosis, lumbar region with neurogenic claudication: Secondary | ICD-10-CM

## 2018-05-07 DIAGNOSIS — M47816 Spondylosis without myelopathy or radiculopathy, lumbar region: Secondary | ICD-10-CM

## 2018-05-07 NOTE — Telephone Encounter (Signed)
Order is in.

## 2018-05-07 NOTE — Telephone Encounter (Signed)
Spoke to patient and she states that she is having extreme back pain that is going down her leg into her foot and it is throbbing.  States it is some better today but she has upcoming events and would like a procedure done if possible.

## 2018-05-07 NOTE — Telephone Encounter (Signed)
Talked back with patient after speaking with Dionisio David N.P. And was instructed that she could come in on Thursday for transforaminal epidural with Dr Dossie Arbour.  Called patient to let her know Thursday 05/09/18 @ 1000 and procedure instructions given. Patient verbalizes u/o information.

## 2018-05-07 NOTE — Telephone Encounter (Signed)
Having increased right sided pain since Thurs, seems to be in quite a bit of distress,  would like to come in today for procedure

## 2018-05-09 ENCOUNTER — Ambulatory Visit
Admission: RE | Admit: 2018-05-09 | Discharge: 2018-05-09 | Disposition: A | Discharge status: Home / Self Care | Payer: Medicare Other | Source: Ambulatory Visit | Attending: Pain Medicine | Admitting: Pain Medicine

## 2018-05-09 ENCOUNTER — Ambulatory Visit (HOSPITAL_BASED_OUTPATIENT_CLINIC_OR_DEPARTMENT_OTHER): Payer: Medicare Other | Admitting: Pain Medicine

## 2018-05-09 ENCOUNTER — Encounter: Payer: Self-pay | Admitting: Pain Medicine

## 2018-05-09 VITALS — BP 153/93 | HR 66 | Temp 98.5°F | Resp 13 | Ht 64.0 in | Wt 180.0 lb

## 2018-05-09 DIAGNOSIS — G8929 Other chronic pain: Secondary | ICD-10-CM

## 2018-05-09 DIAGNOSIS — M5410 Radiculopathy, site unspecified: Secondary | ICD-10-CM | POA: Insufficient documentation

## 2018-05-09 DIAGNOSIS — Z961 Presence of intraocular lens: Secondary | ICD-10-CM | POA: Diagnosis not present

## 2018-05-09 DIAGNOSIS — M5137 Other intervertebral disc degeneration, lumbosacral region: Secondary | ICD-10-CM | POA: Diagnosis not present

## 2018-05-09 DIAGNOSIS — M51379 Other intervertebral disc degeneration, lumbosacral region without mention of lumbar back pain or lower extremity pain: Secondary | ICD-10-CM | POA: Insufficient documentation

## 2018-05-09 DIAGNOSIS — M79604 Pain in right leg: Secondary | ICD-10-CM

## 2018-05-09 DIAGNOSIS — Z79899 Other long term (current) drug therapy: Secondary | ICD-10-CM | POA: Insufficient documentation

## 2018-05-09 DIAGNOSIS — Z90710 Acquired absence of both cervix and uterus: Secondary | ICD-10-CM | POA: Insufficient documentation

## 2018-05-09 DIAGNOSIS — Z7982 Long term (current) use of aspirin: Secondary | ICD-10-CM | POA: Diagnosis not present

## 2018-05-09 DIAGNOSIS — M4807 Spinal stenosis, lumbosacral region: Secondary | ICD-10-CM

## 2018-05-09 DIAGNOSIS — Z79891 Long term (current) use of opiate analgesic: Secondary | ICD-10-CM | POA: Insufficient documentation

## 2018-05-09 DIAGNOSIS — M48062 Spinal stenosis, lumbar region with neurogenic claudication: Secondary | ICD-10-CM | POA: Diagnosis not present

## 2018-05-09 DIAGNOSIS — M9983 Other biomechanical lesions of lumbar region: Secondary | ICD-10-CM | POA: Insufficient documentation

## 2018-05-09 DIAGNOSIS — Z9889 Other specified postprocedural states: Secondary | ICD-10-CM | POA: Insufficient documentation

## 2018-05-09 DIAGNOSIS — Z882 Allergy status to sulfonamides status: Secondary | ICD-10-CM | POA: Insufficient documentation

## 2018-05-09 DIAGNOSIS — Z9010 Acquired absence of unspecified breast and nipple: Secondary | ICD-10-CM | POA: Diagnosis not present

## 2018-05-09 MED ORDER — DEXAMETHASONE SODIUM PHOSPHATE 10 MG/ML IJ SOLN
INTRAMUSCULAR | Status: AC
  Filled 2018-05-09: qty 1

## 2018-05-09 MED ORDER — SODIUM CHLORIDE 0.9% FLUSH
1.0000 mL | Freq: Once | INTRAVENOUS | Status: AC
  Administered 2018-05-09: 1 mL

## 2018-05-09 MED ORDER — TRIAMCINOLONE ACETONIDE 40 MG/ML IJ SUSP
40.0000 mg | Freq: Once | INTRAMUSCULAR | Status: AC
  Administered 2018-05-09: 40 mg

## 2018-05-09 MED ORDER — ROPIVACAINE HCL 2 MG/ML IJ SOLN
1.0000 mL | Freq: Once | INTRAMUSCULAR | Status: AC
  Administered 2018-05-09: 1 mL via EPIDURAL

## 2018-05-09 MED ORDER — MIDAZOLAM HCL 5 MG/5ML IJ SOLN: 1.0000 mg | INTRAMUSCULAR | Status: DC | PRN

## 2018-05-09 MED ORDER — SODIUM CHLORIDE 0.9% FLUSH
2.0000 mL | Freq: Once | INTRAVENOUS | Status: AC
  Administered 2018-05-09: 2 mL

## 2018-05-09 MED ORDER — SODIUM CHLORIDE 0.9 % IJ SOLN
INTRAMUSCULAR | Status: AC
  Filled 2018-05-09: qty 10

## 2018-05-09 MED ORDER — IOPAMIDOL (ISOVUE-M 200) INJECTION 41%
10.0000 mL | Freq: Once | INTRAMUSCULAR | Status: AC
  Administered 2018-05-09: 10 mL via EPIDURAL

## 2018-05-09 MED ORDER — LIDOCAINE HCL 2 % IJ SOLN
INTRAMUSCULAR | Status: AC
  Filled 2018-05-09: qty 20

## 2018-05-09 MED ORDER — LIDOCAINE HCL 2 % IJ SOLN
20.0000 mL | Freq: Once | INTRAMUSCULAR | Status: AC
  Administered 2018-05-09: 400 mg

## 2018-05-09 MED ORDER — IOPAMIDOL (ISOVUE-M 200) INJECTION 41%
INTRAMUSCULAR | Status: AC
  Filled 2018-05-09: qty 10

## 2018-05-09 MED ORDER — LACTATED RINGERS IV SOLN: 1000.0000 mL | Freq: Once | INTRAVENOUS | Status: DC

## 2018-05-09 MED ORDER — FENTANYL CITRATE (PF) 100 MCG/2ML IJ SOLN: 25.0000 ug | INTRAMUSCULAR | Status: DC | PRN

## 2018-05-09 MED ORDER — TRIAMCINOLONE ACETONIDE 40 MG/ML IJ SUSP
INTRAMUSCULAR | Status: AC
Start: 2018-05-09 — End: ?
  Filled 2018-05-09: qty 1

## 2018-05-09 MED ORDER — ROPIVACAINE HCL 2 MG/ML IJ SOLN
2.0000 mL | Freq: Once | INTRAMUSCULAR | Status: AC
  Administered 2018-05-09: 2 mL via EPIDURAL

## 2018-05-09 MED ORDER — DEXAMETHASONE SODIUM PHOSPHATE 10 MG/ML IJ SOLN
10.0000 mg | Freq: Once | INTRAMUSCULAR | Status: AC
  Administered 2018-05-09: 10 mg

## 2018-05-09 NOTE — Progress Notes (Signed)
Safety precautions to be maintained throughout the outpatient stay will include: orient to surroundings, keep bed in low position, maintain call bell within reach at all times, provide assistance with transfer out of bed and ambulation.  

## 2018-05-09 NOTE — Patient Instructions (Addendum)
____________________________________________________________________________________________  Post-Procedure Discharge Instructions  Instructions:  Apply ice: Fill a plastic sandwich bag with crushed ice. Cover it with a small towel and apply to injection site. Apply for 15 minutes then remove x 15 minutes. Repeat sequence on day of procedure, until you go to bed. The purpose is to minimize swelling and discomfort after procedure.  Apply heat: Apply heat to procedure site starting the day following the procedure. The purpose is to treat any soreness and discomfort from the procedure.  Food intake: Start with clear liquids (like water) and advance to regular food, as tolerated.   Physical activities: Keep activities to a minimum for the first 8 hours after the procedure.   Driving: If you have received any sedation, you are not allowed to drive for 24 hours after your procedure.  Blood thinner: Restart your blood thinner 6 hours after your procedure. (Only for those taking blood thinners)  Insulin: As soon as you can eat, you may resume your normal dosing schedule. (Only for those taking insulin)  Infection prevention: Keep procedure site clean and dry.  Post-procedure Pain Diary: Extremely important that this be done correctly and accurately. Recorded information will be used to determine the next step in treatment.  Pain evaluated is that of treated area only. Do not include pain from an untreated area.  Complete every hour, on the hour, for the initial 8 hours. Set an alarm to help you do this part accurately.  Do not go to sleep and have it completed later. It will not be accurate.  Follow-up appointment: Keep your follow-up appointment after the procedure. Usually 2 weeks for most procedures. (6 weeks in the case of radiofrequency.) Bring you pain diary.   Expect:  From numbing medicine (AKA: Local Anesthetics): Numbness or decrease in pain.  Onset: Full effect within 15  minutes of injected.  Duration: It will depend on the type of local anesthetic used. On the average, 1 to 8 hours.   From steroids: Decrease in swelling or inflammation. Once inflammation is improved, relief of the pain will follow.  Onset of benefits: Depends on the amount of swelling present. The more swelling, the longer it will take for the benefits to be seen. In some cases, up to 10 days.  Duration: Steroids will stay in the system x 2 weeks. Duration of benefits will depend on multiple posibilities including persistent irritating factors.  From procedure: Some discomfort is to be expected once the numbing medicine wears off. This should be minimal if ice and heat are applied as instructed.  Call if:  You experience numbness and weakness that gets worse with time, as opposed to wearing off.  New onset bowel or bladder incontinence. (This applies to Spinal procedures only)  Emergency Numbers:  Durning business hours (Monday - Thursday, 8:00 AM - 4:00 PM) (Friday, 9:00 AM - 12:00 Noon): [phone] Pain Management Discharge Instructions  General Discharge Instructions :  If you need to reach your doctor call: Monday-Friday 8:00 am - 4:00 pm at [phone] or toll free [phone].  After clinic hours [phone] to have operator reach doctor.  Bring all of your medication bottles to all your appointments in the pain clinic.  To cancel or reschedule your appointment with Pain Management please remember to call 24 hours in advance to avoid a fee.  Refer to the educational materials which you have been given on: General Risks, I had my Procedure. Discharge Instructions, Post Sedation.  Post Procedure Instructions:  The drugs you  were given will stay in your system until tomorrow, so for the next 24 hours you should not drive, make any legal decisions or drink any alcoholic beverages.  You may eat anything you prefer, but it is better to start with liquids then soups and  crackers, and gradually work up to solid foods.  Please notify your doctor immediately if you have any unusual bleeding, trouble breathing or pain that is not related to your normal pain.  Depending on the type of procedure that was done, some parts of your body may feel week and/or numb.  This usually clears up by tonight or the next day.  Walk with the use of an assistive device or accompanied by an adult for the 24 hours.  You may use ice on the affected area for the first 24 hours.  Put ice in a Ziploc bag and cover with a towel and place against area 15 minutes on 15 minutes off.  You may switch to heat after 24 hours.Selective Nerve Root Block Patient Information  Description: Specific nerve roots exit the spinal canal and these nerves can be compressed and inflamed by a bulging disc and bone spurs.  By injecting steroids on the nerve root, we can potentially decrease the inflammation surrounding these nerves, which often leads to decreased pain.  Also, by injecting local anesthesia on the nerve root, this can provide [place] helpful information to give to your referring doctor if it decreases your pain.  Selective nerve root blocks can be done along the spine from the neck to the low back depending on the location of your pain.   After numbing the skin with local anesthesia, a small needle is passed to the nerve root and the position of the needle is verified using x-ray pictures.  After the needle is in correct position, we then deposit the medication.  You may experience a pressure sensation while this is being done.  The entire block usually lasts less than 15 minutes.  Conditions that may be treated with selective nerve root blocks: Low back and leg pain Spinal stenosis Diagnostic block prior to potential surgery Neck and arm pain Post laminectomy syndrome  Preparation for the injection:  Do not eat any solid food or dairy products within 8 hours of your appointment. You may drink clear  liquids up to 3 hours before an appointment.  Clear liquids include water, black coffee, juice or soda.  No milk or cream please. You may take your regular medications, including pain medications, with a sip of water before your appointment.  Diabetics should hold regular insulin (if taken separately) and take 1/2 normal NPH dose the morning of the procedure.  Carry some sugar containing items with you to your appointment. A driver must accompany you and be prepared to drive you home after your procedure. Bring all your current medications with you. An IV may be inserted and sedation may be given at the discretion of the physician. A blood pressure cuff, EKG, and other monitors will often be applied during the procedure.  Some patients may need to have extra oxygen administered for a short period. You will be asked to provide medical information, including allergies, prior to the procedure.  We must know immediately if you are taking blood  Thinners (like Coumadin) or if you are allergic to IV iodine contrast (dye).  Possible side-effects: All are usually temporary Bleeding from needle site Light headedness Numbness and tingling Decreased blood pressure Weakness in arms/legs Pressure sensation in  back/neck Pain at injection site (several days)  Possible complications: All are extremely rare Infection Nerve injury Spinal headache (a headache wore with upright position)  Call if you experience: Fever/chills associated with headache or increased back/neck pain Headache worsened by an upright position New onset weakness or numbness of an extremity below the injection site Hives or difficulty breathing (go to the emergency room) Inflammation or drainage at the injection site(s) Severe back/neck pain greater than usual New symptoms which are concerning to you  Please note:  Although the local anesthetic injected can often make your back or neck feel good for several hours after the  injection the pain will likely return.  It takes 3-5 days for steroids to work on the nerve root. You may not notice any pain relief for at least one week.  If effective, we will often do a series of 3 injections spaced 3-6 weeks apart to maximally decrease your pain.    If you have any questions, please call [phone] [facility] Clinic[facility] Ster[facility] In[facility] Patient Information  Description: The epidural space surrounds the nerves as they exit the spinal cord.  In some patients, the nerves can be compressed and inflamed by a bulging disc or a tight spinal canal (spinal stenosis).  By injecting steroids into the epidural space, we can bring irritated nerves into direct contact with a potentially helpful medication.  These steroids act directly on the irritated nerves and can reduce swelling and inflammation which often leads to decreased pain.  Epidural steroids may be injected anywhere along the spine and from the neck to the low back depending upon the location of your pain.   After numbing the skin with local anesthetic (like Novocaine), a small needle is passed into the epidural space slowly.  You may experience a sensation of pressure while this is being done.  The entire block usually last less than 10 minutes.  Conditions which may be treated by epidural steroids:  Low back and leg pain Neck and arm pain Spinal stenosis Post-laminectomy syndrome Herpes zoster (shingles) pain Pain from compression fractures  Preparation for the injection:  Do not eat any solid food or dairy products within 8 hours of your appointment.  You may drink clear liquids up to 3 hours before appointment.  Clear liquids include water, black coffee, juice or soda.  No milk or cream please. You may take your regular medication, including pain medications, with a sip of water before your appointment  Diabetics should hold regular insulin (if taken separately) and take 1/2 normal  NPH dos the morning of the procedure.  Carry some sugar containing items with you to your appointment. A driver must accompany you and be prepared to drive you home after your procedure.  Bring all your current medications with your. An IV may be inserted and sedation may be given at the discretion of the physician.   A blood pressure cuff, EKG and other monitors will often be applied during the procedure.  Some patients may need to have extra oxygen administered for a short period. You will be asked to provide medical information, including your allergies, prior to the procedure.  We must know immediately if you are taking blood thinners (like Coumadin/Warfarin)  Or if you are allergic to IV iodine contrast (dye). We must know if you could possible be pregnant.  Possible side-effects: Bleeding from needle site Infection (rare, may require surgery) Nerve injury (rare) Numbness & tingling (temporary) Difficulty urinating (rare, temporary) Spinal  headache ( a headache worse with upright posture) Light -headedness (temporary) Pain at injection site (several days) Decreased blood pressure (temporary) Weakness in arm/leg (temporary) Pressure sensation in back/neck (temporary)  Call if you experience: Fever/chills associated with headache or increased back/neck pain. Headache worsened by an upright position. New onset weakness or numbness of an extremity below the injection site Hives or difficulty breathing (go to the emergency room) Inflammation or drainage at the infection site Severe back/neck pain Any new symptoms which are concerning to you  Please note:  Although the local anesthetic injected can often make your back or neck feel good for several hours after the injection, the pain will likely return.  It takes 3-7 days for steroids to work in the epidural space.  You may not notice any pain relief for at least that one week.  If effective, we will often do a series of three  injections spaced 3-6 weeks apart to maximally decrease your pain.  After the initial series, we generally will wait several months before considering a repeat injection of the same type.  If you have any questions, please call [phone]  [facility] Pain ClinicAfter hours: [phone] ____________________________________________________________________________________________

## 2018-05-09 NOTE — Progress Notes (Signed)
Patient's Name: Wanda Martinez  MRN: [id number]  Referring Provider: Einar Pheasant, MD  DOB: Apr 14, 1935  PCP: Einar Pheasant, MD  DOS: 05/09/2018  Note by: Gaspar Cola, MD  Service setting: Ambulatory outpatient  Specialty: Interventional Pain Management  Patient type: Established  Location: [facility] (AMB) Pain Management Facility  Visit type: Interventional Procedure   Primary Reason for Visit: Interventional Pain Management Treatment. CC: Back Pain (lower right)  Procedure:  Anesthesia, Analgesia, Anxiolysis:  Procedure #1: Type: Therapeutic Inter-Laminar Epidural Steroid Injection  #2  Region: Lumbar Level: L3-4 Level. Laterality: Right Paramedial  Procedure #2: Type: Therapeutic Trans-Foraminal Epidural Steroid Injection  #2  Region: Lumbar Level: L5 Paravertebral Laterality: Right Paravertebral Position: Prone  Type: Local Anesthesia Indication(s): Analgesia         Route: Infiltration ([facility]/IM) IV Access: Declined Sedation: Declined  Local Anesthetic: Lidocaine 1-2%   Indications: 1. DDD (degenerative disc disease), lumbosacral   2. Chronic lumbar radicular pain (S1) (Right)   3. Chronic lower extremity pain (Secondary Area of Pain) (Right)   4. Lumbar spinal stenosis (with neurogenic claudication) (L3-4)   5. Lumbosacral foraminal stenosis (L5-S1) (Right)    Pain Score: Pre-procedure: 8 /10 Post-procedure: 3 /10  Pre-op Assessment:  Wanda Martinez is a 83 y.o. (year old), female patient, seen today for interventional treatment. She  has a past surgical history that includes Breast biopsy (2013); Tonsillectomy and adenoidectomy (79); Vesicovaginal fistula closure w/ TAH (1983); Breast surgery (1986); Breast enhancement surgery (1987); Breast implant removal; Breast implant removal (Right, 08/29/2012); Mastectomy (08/2012); Abdominal hysterectomy; and Colonoscopy with propofol (N/A, 09/13/2016). Wanda Martinez has a current medication list which includes the following  prescription(s): amlodipine, aspirin, calcium carbonate, citalopram, cyclobenzaprine, gabapentin, glucosamine sulfate, hydrochlorothiazide, hydrocodone-acetaminophen, letrozole, losartan, lubiprostone, magnesium oxide, meloxicam, metoprolol succinate, multivitamin, omega-3, omeprazole, pantoprazole, and pravastatin. Her primarily concern today is the Back Pain (lower right)  Initial Vital Signs:  Pulse/HCG Rate: 66ECG Heart Rate: 63 Temp: 98.5 F (36.9 C) Resp: 16 BP: (!) 163/89 SpO2: 100 %  BMI: Estimated body mass index is 30.9 kg/m as calculated from the following:   Height as of this encounter: 5\' 4"  (1.626 m).   Weight as of this encounter: 180 lb (81.6 kg).  Risk Assessment: Allergies: Reviewed. She is allergic to sulfa antibiotics and vesicare [solifenacin].  Allergy Precautions: None required Coagulopathies: Reviewed. None identified.  Blood-thinner therapy: None at this time Active Infection(s): Reviewed. None identified. Wanda Martinez is afebrile  Site Confirmation: Wanda Martinez was asked to confirm the procedure and laterality before marking the site Procedure checklist: Completed Consent: Before the procedure and under the influence of no sedative(s), amnesic(s), or anxiolytics, the patient was informed of the treatment options, risks and possible complications. To fulfill our ethical and legal obligations, as recommended by the American Medical Association's Code of Ethics, I have informed the patient of my clinical impression; the nature and purpose of the treatment or procedure; the risks, benefits, and possible complications of the intervention; the alternatives, including doing nothing; the risk(s) and benefit(s) of the alternative treatment(s) or procedure(s); and the risk(s) and benefit(s) of doing nothing. The patient was provided information about the general risks and possible complications associated with the procedure. These may include, but are not limited to: failure to  achieve desired goals, infection, bleeding, organ or nerve damage, allergic reactions, paralysis, and death. In addition, the patient was informed of those risks and complications associated to Spine-related procedures, such as failure to decrease pain; infection (i.e.: Meningitis, epidural or  intraspinal abscess); bleeding (i.e.: epidural hematoma, subarachnoid hemorrhage, or any other type of intraspinal or peri-dural bleeding); organ or nerve damage (i.e.: Any type of peripheral nerve, nerve root, or spinal cord injury) with subsequent damage to sensory, motor, and/or autonomic systems, resulting in permanent pain, numbness, and/or weakness of one or several areas of the body; allergic reactions; (i.e.: anaphylactic reaction); and/or death. Furthermore, the patient was informed of those risks and complications associated with the medications. These include, but are not limited to: allergic reactions (i.e.: anaphylactic or anaphylactoid reaction(s)); adrenal axis suppression; blood sugar elevation that in diabetics may result in ketoacidosis or comma; water retention that in patients with history of congestive heart failure may result in shortness of breath, pulmonary edema, and decompensation with resultant heart failure; weight gain; swelling or edema; medication-induced neural toxicity; particulate matter embolism and blood vessel occlusion with resultant organ, and/or nervous system infarction; and/or aseptic necrosis of one or more joints. Finally, the patient was informed that Medicine is not an exact science; therefore, there is also the possibility of unforeseen or unpredictable risks and/or possible complications that may result in a catastrophic outcome. The patient indicated having understood very clearly. We have given the patient no guarantees and we have made no promises. Enough time was given to the patient to ask questions, all of which were answered to the patient's satisfaction. Wanda Martinez has  indicated that she wanted to continue with the procedure. Attestation: I, the ordering provider, attest that I have discussed with the patient the benefits, risks, side-effects, alternatives, likelihood of achieving goals, and potential problems during recovery for the procedure that I have provided informed consent. Date  Time: 05/09/2018  9:56 AM  Pre-Procedure Preparation:  Monitoring: As per clinic protocol. Respiration, ETCO2, SpO2, BP, heart rate and rhythm monitor placed and checked for adequate function Safety Precautions: Patient was assessed for positional comfort and pressure points before starting the procedure. Time-out: I initiated and conducted the "Time-out" before starting the procedure, as per protocol. The patient was asked to participate by confirming the accuracy of the "Time Out" information. Verification of the correct person, site, and procedure were performed and confirmed by me, the nursing staff, and the patient. "Time-out" conducted as per Joint Commission's Universal Protocol (UP.01.01.01). Time: 1050  Description of Procedure #1 Process:   Target Area: The  interlaminar space, initially targeting the lower border of the superior vertebral body lamina. Approach: Posterior paramedial approach. Area Prepped: Entire Posterior Lumbosacral Region Prepping solution: ChloraPrep (2% chlorhexidine gluconate and 70% isopropyl alcohol) Safety Precautions: Aspiration looking for blood return was conducted prior to all injections. At no point did we inject any substances, as a needle was being advanced. No attempts were made at seeking any paresthesias. Safe injection practices and needle disposal techniques used. Medications properly checked for expiration dates. SDV (single dose vial) medications used. Description of the Procedure: Protocol guidelines were followed. The patient was placed in position over the fluoroscopy table. The target area was identified and the area prepped in  the usual manner. Skin desensitized using vapocoolant spray. Skin & deeper tissues infiltrated with local anesthetic. Appropriate amount of time allowed to pass for local anesthetics to take effect. The procedure needle was introduced through the skin, ipsilateral to the reported pain, and advanced to the target area. Bone was contacted and the needle walked caudad, until the lamina was cleared. The ligamentum flavum was engaged and loss-of-resistance technique used as the epidural needle was advanced. The epidural space was identified using "  loss-of-resistance technique" with 2-3 ml of PF-NaCl (0.9% NSS), in a 5cc LOR glass syringe. Proper needle placement secured. Negative aspiration confirmed. Solution injected in intermittent fashion, asking for systemic symptoms every 0.5cc of injectate. The needles were then removed and the area cleansed, making sure to leave some of the prepping solution back to take advantage of its long term bactericidal properties. Start Time: 1050 hrs. Materials:  Needle(s) Type: Epidural needle Gauge: 17G Length: 3.5-in Medication(s): Please see orders for medications and dosing details.  Description of Procedure #2 Process:   Target Area: The inferior and lateral portion of the pedicle, just lateral to a line created by the 6:00 position of the pedicle and the superior articular process of the vertebral body below. On the lateral view, this target lies just posterior to the anterior aspect of the lamina and posterior to the midpoint created between the anterior and the posterior aspect of the neural foramina. Approach: Posterior paravertebral approach. Area Prepped: Same as above Prepping solution: Same as above Safety Precautions: Same as above Description of the Procedure: Protocol guidelines were followed. The patient was placed in position over the fluoroscopy table. The target area was identified and the area prepped in the usual manner. Skin desensitized using  vapocoolant spray. Skin & deeper tissues infiltrated with local anesthetic. Appropriate amount of time allowed to pass for local anesthetics to take effect. The procedure needles were then advanced to the target area. Proper needle placement secured. Negative aspiration confirmed. Solution injected in intermittent fashion, asking for systemic symptoms every 0.2cc of injectate. The needles were then removed and the area cleansed, making sure to leave some of the prepping solution back to take advantage of its long term bactericidal properties. Vitals:   05/09/18 1043 05/09/18 1050 05/09/18 1055 05/09/18 1100  BP: (!) 162/87 (!) 157/85 (!) 156/85 (!) 153/93  Pulse:      Resp: 16 14 13 13   Temp:      TempSrc:      SpO2: 100% 100% 98% 99%  Weight:      Height:        End Time: 1100 hrs. Materials:  Needle(s) Type: Regular needle Gauge: 22G Length: 3.5-in Medication(s): Please see orders for medications and dosing details.  Imaging Guidance (Spinal):  Type of Imaging Technique: Fluoroscopy Guidance (Spinal) Indication(s): Assistance in needle guidance and placement for procedures requiring needle placement in or near specific anatomical locations not easily accessible without such assistance. Exposure Time: Please see nurses notes. Contrast: Before injecting any contrast, we confirmed that the patient did not have an allergy to iodine, shellfish, or radiological contrast. Once satisfactory needle placement was completed at the desired level, radiological contrast was injected. Contrast injected under live fluoroscopy. No contrast complications. See chart for type and volume of contrast used. Fluoroscopic Guidance: I was personally present during the use of fluoroscopy. "Tunnel Vision Technique" used to obtain the best possible view of the target area. Parallax error corrected before commencing the procedure. "Direction-depth-direction" technique used to introduce the needle under continuous pulsed  fluoroscopy. Once target was reached, antero-posterior, oblique, and lateral fluoroscopic projection used confirm needle placement in all planes. Images permanently stored in EMR. Interpretation: I personally interpreted the imaging intraoperatively. Adequate needle placement confirmed in multiple planes. Appropriate spread of contrast into desired area was observed. No evidence of afferent or efferent intravascular uptake. No intrathecal or subarachnoid spread observed. Permanent images saved into the patient's record.  Antibiotic Prophylaxis:   Anti-infectives (From admission, onward)   None  Indication(s): None identified  Post-operative Assessment:  Post-procedure Vital Signs:  Pulse/HCG Rate: 6668 Temp: 98.5 F (36.9 C) Resp: 13 BP: (!) 153/93 SpO2: 99 %  EBL: None  Complications: No immediate post-treatment complications observed by team, or reported by patient.  Note: The patient tolerated the entire procedure well. A repeat set of vitals were taken after the procedure and the patient was kept under observation following institutional policy, for this type of procedure. Post-procedural neurological assessment was performed, showing return to baseline, prior to discharge. The patient was provided with post-procedure discharge instructions, including a section on how to identify potential problems. Should any problems arise concerning this procedure, the patient was given instructions to immediately contact us, at any time, without hesitation. In any case, we plan to contact the patient by telephone for a follow-up status report regarding this interventional procedure.  Comments:  No additional relevant information.  Plan of Care    Imaging Orders     DG C-Arm 1-60 Min-No Report  Procedure Orders     Lumbar Epidural Injection     Lumbar Transforaminal Epidural     Lumbar Epidural Injection     Lumbar Transforaminal Epidural  Medications ordered for procedure: Meds  ordered this encounter  Medications  . iopamidol (ISOVUE-M) 41 % intrathecal injection 10 mL    Must be Myelogram-compatible. If not available, you may substitute with a water-soluble, non-ionic, hypoallergenic, myelogram-compatible radiological contrast medium.  Marland Kitchen lidocaine (XYLOCAINE) 2 % (with pres) injection 400 mg  . DISCONTD: midazolam (VERSED) 5 MG/5ML injection 1-2 mg    Make sure Flumazenil is available in the pyxis when using this medication. If oversedation occurs, administer 0.2 mg IV over 15 sec. If after 45 sec no response, administer 0.2 mg again over 1 min; may repeat at 1 min intervals; not to exceed 4 doses (1 mg)  . DISCONTD: fentaNYL (SUBLIMAZE) injection 25-50 mcg    Make sure Narcan is available in the pyxis when using this medication. In the event of respiratory depression (RR< 8/min): Titrate NARCAN (naloxone) in increments of 0.1 to 0.2 mg IV at 2-3 minute intervals, until desired degree of reversal.  . DISCONTD: lactated ringers infusion 1,000 mL  . sodium chloride flush (NS) 0.9 % injection 1 mL  . ropivacaine (PF) 2 mg/mL (0.2%) (NAROPIN) injection 1 mL  . dexamethasone (DECADRON) injection 10 mg  . sodium chloride flush (NS) 0.9 % injection 2 mL  . ropivacaine (PF) 2 mg/mL (0.2%) (NAROPIN) injection 2 mL  . triamcinolone acetonide (KENALOG-40) injection 40 mg   Medications administered: We administered iopamidol, lidocaine, sodium chloride flush, ropivacaine (PF) 2 mg/mL (0.2%), dexamethasone, sodium chloride flush, ropivacaine (PF) 2 mg/mL (0.2%), and triamcinolone acetonide.  See the medical record for exact dosing, route, and time of administration.  New Prescriptions   No medications on file   Disposition: Discharge home  Discharge Date & Time: 05/09/2018; 1115 hrs.   Physician-requested Follow-up: Return for post-procedure eval (2 wks), w/ Dionisio David, NP.  Future Appointments  Date Time Provider [place]  05/27/2018 10:15 AM Milinda Pointer, MD [facility] None  06/04/2018 10:30 AM Milinda Pointer, MD [facility] None  06/25/2018  1:00 PM [facility]A 1 [facility]  07/03/2018 11:00 AM Vevelyn Francois, NP [facility] None  07/09/2018  9:30 AM Einar Pheasant, MD [facility]  04/08/2019 11:15 AM Lloyd Huger, MD [facility] None   Primary Care Physician: Einar Pheasant, MD Location: [facility] Note by: Gaspar Cola,  MD Date: 05/09/2018; Time: 11:47 AM  Disclaimer:  Medicine is not an Chief Strategy Officer. The only guarantee in medicine is that nothing is guaranteed. It is important to note that the decision to proceed with this intervention was based on the information collected from the patient. The Data and conclusions were drawn from the patient's questionnaire, the interview, and the physical examination. Because the information was provided in large part by the patient, it cannot be guaranteed that it has not been purposely or unconsciously manipulated. Every effort has been made to obtain as much relevant data as possible for this evaluation. It is important to note that the conclusions that lead to this procedure are derived in large part from the available data. Always take into account that the treatment will also be dependent on availability of resources and existing treatment guidelines, considered by other Pain Management Practitioners as being common knowledge and practice, at the time of the intervention. For Medico-Legal purposes, it is also important to point out that variation in procedural techniques and pharmacological choices are the acceptable norm. The indications, contraindications, technique, and results of the above procedure should only be interpreted and judged by a Board-Certified Interventional Pain Specialist with extensive familiarity and expertise in the same exact procedure and technique.

## 2018-05-10 ENCOUNTER — Telehealth: Payer: Self-pay

## 2018-05-10 NOTE — Telephone Encounter (Signed)
Post procedure phone call.  Patient states she is doing well.  

## 2018-05-21 DIAGNOSIS — M545 Low back pain: Secondary | ICD-10-CM | POA: Diagnosis not present

## 2018-05-21 DIAGNOSIS — M6281 Muscle weakness (generalized): Secondary | ICD-10-CM | POA: Diagnosis not present

## 2018-05-22 ENCOUNTER — Other Ambulatory Visit: Payer: Self-pay | Admitting: Internal Medicine

## 2018-05-23 ENCOUNTER — Other Ambulatory Visit: Payer: Self-pay | Admitting: Nurse Practitioner

## 2018-05-23 DIAGNOSIS — M545 Low back pain: Secondary | ICD-10-CM | POA: Diagnosis not present

## 2018-05-23 DIAGNOSIS — M7918 Myalgia, other site: Secondary | ICD-10-CM

## 2018-05-23 DIAGNOSIS — M6281 Muscle weakness (generalized): Secondary | ICD-10-CM | POA: Diagnosis not present

## 2018-05-27 ENCOUNTER — Other Ambulatory Visit: Payer: Self-pay

## 2018-05-27 ENCOUNTER — Encounter: Payer: Self-pay | Admitting: Pain Medicine

## 2018-05-27 ENCOUNTER — Ambulatory Visit: Payer: Medicare Other | Attending: Pain Medicine | Admitting: Pain Medicine

## 2018-05-27 VITALS — BP 155/89 | HR 67 | Temp 98.5°F | Resp 16 | Ht 64.0 in | Wt 180.0 lb

## 2018-05-27 DIAGNOSIS — Z79899 Other long term (current) drug therapy: Secondary | ICD-10-CM | POA: Insufficient documentation

## 2018-05-27 DIAGNOSIS — M545 Low back pain, unspecified: Secondary | ICD-10-CM

## 2018-05-27 DIAGNOSIS — Z882 Allergy status to sulfonamides status: Secondary | ICD-10-CM | POA: Insufficient documentation

## 2018-05-27 DIAGNOSIS — M150 Primary generalized (osteo)arthritis: Secondary | ICD-10-CM | POA: Diagnosis not present

## 2018-05-27 DIAGNOSIS — Z90710 Acquired absence of both cervix and uterus: Secondary | ICD-10-CM | POA: Diagnosis not present

## 2018-05-27 DIAGNOSIS — M47817 Spondylosis without myelopathy or radiculopathy, lumbosacral region: Secondary | ICD-10-CM | POA: Insufficient documentation

## 2018-05-27 DIAGNOSIS — Z888 Allergy status to other drugs, medicaments and biological substances status: Secondary | ICD-10-CM | POA: Diagnosis not present

## 2018-05-27 DIAGNOSIS — M47816 Spondylosis without myelopathy or radiculopathy, lumbar region: Secondary | ICD-10-CM | POA: Insufficient documentation

## 2018-05-27 DIAGNOSIS — Z9010 Acquired absence of unspecified breast and nipple: Secondary | ICD-10-CM | POA: Insufficient documentation

## 2018-05-27 DIAGNOSIS — Z9889 Other specified postprocedural states: Secondary | ICD-10-CM | POA: Insufficient documentation

## 2018-05-27 DIAGNOSIS — M5137 Other intervertebral disc degeneration, lumbosacral region: Secondary | ICD-10-CM

## 2018-05-27 DIAGNOSIS — M7918 Myalgia, other site: Secondary | ICD-10-CM | POA: Insufficient documentation

## 2018-05-27 DIAGNOSIS — M533 Sacrococcygeal disorders, not elsewhere classified: Secondary | ICD-10-CM

## 2018-05-27 DIAGNOSIS — Z7982 Long term (current) use of aspirin: Secondary | ICD-10-CM | POA: Insufficient documentation

## 2018-05-27 DIAGNOSIS — M792 Neuralgia and neuritis, unspecified: Secondary | ICD-10-CM | POA: Insufficient documentation

## 2018-05-27 DIAGNOSIS — M6281 Muscle weakness (generalized): Secondary | ICD-10-CM | POA: Diagnosis not present

## 2018-05-27 DIAGNOSIS — G8929 Other chronic pain: Secondary | ICD-10-CM | POA: Diagnosis not present

## 2018-05-27 DIAGNOSIS — M159 Polyosteoarthritis, unspecified: Secondary | ICD-10-CM

## 2018-05-27 DIAGNOSIS — M79604 Pain in right leg: Secondary | ICD-10-CM | POA: Insufficient documentation

## 2018-05-27 MED ORDER — MAGNESIUM OXIDE 400 (241.3 MG) MG PO TABS: 1.0000 | ORAL_TABLET | Freq: Every day | ORAL | 0 refills | Status: DC

## 2018-05-27 MED ORDER — GABAPENTIN 300 MG PO CAPS: 300.0000 mg | ORAL_CAPSULE | Freq: Two times a day (BID) | ORAL | 0 refills | Status: DC

## 2018-05-27 MED ORDER — CYCLOBENZAPRINE HCL 10 MG PO TABS: 10.0000 mg | ORAL_TABLET | Freq: Every day | ORAL | 0 refills | Status: DC

## 2018-05-27 MED ORDER — MELOXICAM 7.5 MG PO TABS
7.5000 mg | ORAL_TABLET | Freq: Every day | ORAL | 0 refills | Status: DC
Start: 2018-06-18 — End: 2018-09-20

## 2018-05-27 NOTE — Progress Notes (Signed)
Safety precautions to be maintained throughout the outpatient stay will include: orient to surroundings, keep bed in low position, maintain call bell within reach at all times, provide assistance with transfer out of bed and ambulation.  

## 2018-05-27 NOTE — Progress Notes (Signed)
Patient's Name: Wanda Martinez  MRN: [id number]  Referring Provider: Einar Pheasant, MD  DOB: 1935-12-06  PCP: Einar Pheasant, MD  DOS: 05/27/2018  Note by: Gaspar Cola, MD  Service setting: Ambulatory outpatient  Specialty: Interventional Pain Management  Location: [facility] (AMB) Pain Management Facility    Patient type: Established   Primary Reason(s) for Visit: Encounter for post-procedure evaluation of chronic illness with mild to moderate exacerbation CC: Back Pain (right hip and leg as well.)  HPI  Wanda Martinez is a 82 y.o. year old, female patient, who comes today for a post-procedure evaluation. She has Hypercholesterolemia; Essential hypertension; Frequent UTI; Primary cancer of upper inner quadrant of right female breast ([place]); Tremor; Toenail fungus; Headache; Obesity (BMI 30-39.9); Dysuria; Sinusitis; Parkinsons ([place]); Fatigue; Chronic kidney disease (CKD), stage II (mild); Clinical depression; Incomplete bladder emptying; Mixed incontinence; HLD (hyperlipidemia); Bladder infection, chronic; Parkinson's disease ([place]); Pure hypercholesterolemia; Hernia, rectovaginal; Long term current use of opiate analgesic; Long term prescription opiate use; Opiate use; Encounter for therapeutic drug level monitoring; Encounter for chronic pain management; Chronic low back pain (Primary Source of Pain) (Bilateral) (R>L); Lumbar spondylosis; Chronic hip pain (Bilateral); Chronic neck pain; Cervical spondylosis; Chronic cervical radicular pain (Right); Chronic upper extremity pain (Right); Osteoarthritis; Diffuse myofascial pain syndrome; Neurogenic pain; Chronic upper back pain (Right); Myofascial pain syndrome (Right) (cervicothoracic); Vaginitis; Lumbar facet syndrome (Bilateral) (R>L); Malignant neoplasm of right female breast ([place]); Chronic constipation; Cervical facet hypertrophy; Cervical facet syndrome ([place]); Chronic shoulder pain (Right); Chronic pain syndrome; Chronic sacroiliac joint pain  (Left); Chronic sacroiliac joint pain (Right); Lumbosacral foraminal stenosis (L5-S1) (Right); Lumbar spinal stenosis (with neurogenic claudication) (L3-4); Chronic lower extremity pain (Secondary Area of Pain) (Right); Chronic lumbar radicular pain (S1) (Right); Osteopenia; Trochanteric bursitis of hip (Bilateral); Spondylosis without myelopathy or radiculopathy, lumbosacral region; Trigger point with back pain (Right); and DDD (degenerative disc disease), lumbosacral on their problem list. Her primarily concern today is the Back Pain (right hip and leg as well.)  Pain Assessment: Location: Right Back Radiating: right hip and leg Onset: More than a month ago Duration: Chronic pain Quality: Aching, Numbness, Discomfort Severity: 7 /10 (subjective, self-reported pain score)  Note: Reported level is inconsistent with clinical observations. Clinically the patient looks like a 2/10 A 2/10 is viewed as "Mild to Moderate" and described as noticeable and distracting. Impossible to hide from other people. More frequent flare-ups. Still possible to adapt and function close to normal. It can be very annoying and may have occasional stronger flare-ups. With discipline, patients may get used to it and adapt. Wanda Martinez does not seem to understand the use of our objective pain scale When using our objective Pain Scale, levels between 6 and 10/10 are said to belong in an emergency room, as it progressively worsens from a 6/10, described as severely limiting, requiring emergency care not usually available at an outpatient pain management facility. At a 6/10 level, communication becomes difficult and requires great effort. Assistance to reach the emergency department may be required. Facial flushing and profuse sweating along with potentially dangerous increases in heart rate and blood pressure will be evident. Effect on ADL: slowed down Timing: Constant Modifying factors: medication BP: (!) 155/89  HR: 67  [name] comes in today for post-procedure evaluation after the treatment done on 05/23/2018.  Further details on both, my assessment(s), as well as the proposed treatment plan, please see below.  Post-Procedure Assessment  05/09/2018 Procedure: Diagnostic right-sided L3-4 interlaminar LESI #2 + right sided  L5 transforaminal LESI #2 under fluoroscopic guidance, no sedation Pre-procedure pain score:  8/10 Post-procedure pain score: 3/10 (> 50% relief) Influential Factors: BMI: 30.90 kg/m Intra-procedural challenges: None observed.         Assessment challenges: None detected.              Reported side-effects: None.        Post-procedural adverse reactions or complications: None reported         Sedation: No sedation used. When no sedatives are used, the analgesic levels obtained are directly associated to the effectiveness of the local anesthetics. However, when sedation is provided, the level of analgesia obtained during the initial 1 hour following the intervention, is believed to be the result of a combination of factors. These factors may include, but are not limited to: 1. The effectiveness of the local anesthetics used. 2. The effects of the analgesic(s) and/or anxiolytic(s) used. 3. The degree of discomfort experienced by the patient at the time of the procedure. 4. The patients ability and reliability in recalling and recording the events. 5. The presence and influence of possible secondary gains and/or psychosocial factors. Reported result: Relief experienced during the 1st hour after the procedure: 100 % (Ultra-Short Term Relief)            Interpretative annotation: Clinically appropriate result. No IV Analgesic or Anxiolytic given, therefore benefits are completely due to Local Anesthetic effects.          Effects of local anesthetic: The analgesic effects attained during this period are directly associated to the localized infiltration of local anesthetics and therefore cary  significant diagnostic value as to the etiological location, or anatomical origin, of the pain. Expected duration of relief is directly dependent on the pharmacodynamics of the local anesthetic used. Long-acting (4-6 hours) anesthetics used.  Reported result: Relief during the next 4 to 6 hour after the procedure: 80 % (Short-Term Relief)            Interpretative annotation: Clinically appropriate result. Analgesia during this period is likely to be Local Anesthetic-related.          Long-term benefit: Defined as the period of time past the expected duration of local anesthetics (1 hour for short-acting and 4-6 hours for long-acting). With the possible exception of prolonged sympathetic blockade from the local anesthetics, benefits during this period are typically attributed to, or associated with, other factors such as analgesic sensory neuropraxia, antiinflammatory effects, or beneficial biochemical changes provided by agents other than the local anesthetics.  Reported result: Extended relief following procedure: 80 % (Long-Term Relief)            Interpretative annotation: Clinically appropriate result. Good relief. No permanent benefit expected. Inflammation plays a part in the etiology to the pain.          Current benefits: Defined as reported results that persistent at this point in time.   Analgesia: >50 %            Function: Somewhat improved ROM: Somewhat improved Interpretative annotation: Ongoing benefit. Therapeutic benefit observed. Effective therapeutic approach.          Interpretation: Results would suggest a successful diagnostic intervention.                  Plan:  Keep appointment for RFA on 06/04/18.                Laboratory Chemistry  Renal Markers Lab Results  Component Value Date  BUN 20 03/04/2018   CREATININE 1.03 03/04/2018   GFRAA >60 10/16/2017   GFRNONAA 58 (L) 10/16/2017                             Hepatic Markers Lab Results  Component Value Date    AST 18 03/04/2018   ALT 5 03/04/2018   ALBUMIN 4.3 03/04/2018                        Hematology Parameters Lab Results  Component Value Date   PLT 293.0 09/20/2017   HGB 12.0 09/20/2017   HCT 35.8 (L) 09/20/2017                        CV Markers Lab Results  Component Value Date   TROPONINI < 0.02 10/09/2014                         Note: Lab results reviewed.  Recent Diagnostic Imaging Results  DG C-Arm 1-60 Min-No Report Fluoroscopy was utilized by the requesting physician.  No radiographic  interpretation.   Complexity Note: I personally reviewed the fluoroscopic imaging of the procedure.                        Meds   Current Outpatient Medications:  .  amLODipine (NORVASC) 2.5 MG tablet, TAKE 1 TABLET BY MOUTH ONCE DAILY., Disp: 30 tablet, Rfl: 2 .  aspirin 81 MG EC tablet, Take 81 mg by mouth daily as needed.  , Disp: , Rfl:  .  Calcium Carbonate (CALCIUM 600) 1500 MG TABS, Take 1 tablet by mouth daily.  , Disp: , Rfl:  .  citalopram (CELEXA) 20 MG tablet, TAKE 1 & 1/2 TABLETS BY MOUTH ONCE DAILY, Disp: , Rfl:  .  [START ON 06/18/2018] cyclobenzaprine (FLEXERIL) 10 MG tablet, Take 1 tablet (10 mg total) by mouth at bedtime., Disp: 90 tablet, Rfl: 0 .  [START ON 06/18/2018] gabapentin (NEURONTIN) 300 MG capsule, Take 1 capsule (300 mg total) by mouth 2 (two) times daily., Disp: 180 capsule, Rfl: 0 .  GLUCOSAMINE SULFATE PO, Take by mouth daily. , Disp: , Rfl:  .  hydrochlorothiazide (HYDRODIURIL) 25 MG tablet, TAKE 1 TABLET BY MOUTH ONCE DAILY. (PATIENT TAKES 1/2 ONCE DAILY AND EXTRA 1/2 IF NEEDED), Disp: 15 tablet, Rfl: 1 .  [START ON 06/12/2018] HYDROcodone-acetaminophen (NORCO/VICODIN) 5-325 MG tablet, Take 1 tablet by mouth every 8 (eight) hours as needed for severe pain., Disp: 90 tablet, Rfl: 0 .  losartan (COZAAR) 100 MG tablet, TAKE 1 TABLET BY MOUTH ONCE DAILY., Disp: 30 tablet, Rfl: 2 .  lubiprostone (AMITIZA) 24 MCG capsule, TAKE 1 CAPSULE BY MOUTH ONCE DAILY WITH  BREAKFAST., Disp: , Rfl:  .  [START ON 06/18/2018] magnesium oxide (MAGNESIUM-OXIDE) 400 (241.3 Mg) MG tablet, Take 1 tablet (400 mg total) by mouth daily., Disp: 90 tablet, Rfl: 0 .  [START ON 06/18/2018] meloxicam (MOBIC) 7.5 MG tablet, Take 1 tablet (7.5 mg total) by mouth daily., Disp: 90 tablet, Rfl: 0 .  metoprolol succinate (TOPROL-XL) 25 MG 24 hr tablet, TAKE 1 TABLET BY MOUTH TWICE DAILY, Disp: 60 tablet, Rfl: 1 .  Multiple Vitamin (MULTIVITAMIN) tablet, Take 1 tablet by mouth daily., Disp: , Rfl:  .  Omega-3 1000 MG CAPS, Take by mouth daily. , Disp: , Rfl:  .  omeprazole (PRILOSEC) 20 MG capsule, TAKE 1 CAPSULE BY MOUTH TWICE DAILY FOR REFLUX., Disp: 60 capsule, Rfl: 1 .  pantoprazole (PROTONIX) 40 MG tablet, TAKE 1 TABLET BY MOUTH ONCE DAILY., Disp: 30 tablet, Rfl: 2 .  pravastatin (PRAVACHOL) 20 MG tablet, TAKE 1 TABLET BY MOUTH ONCE DAILY., Disp: 30 tablet, Rfl: 2  ROS  Constitutional: Denies any fever or chills Gastrointestinal: No reported hemesis, hematochezia, vomiting, or acute GI distress Musculoskeletal: Denies any acute onset joint swelling, redness, loss of ROM, or weakness Neurological: No reported episodes of acute onset apraxia, aphasia, dysarthria, agnosia, amnesia, paralysis, loss of coordination, or loss of consciousness  Allergies  Wanda Martinez is allergic to sulfa antibiotics and vesicare [solifenacin].  [place]  Drug: Wanda Martinez  reports that she does not use drugs. Alcohol:  reports that she does not drink alcohol. Tobacco:  reports that she has never smoked. She has never used smokeless tobacco. Medical:  has a past medical history of Anemia, Arm pain (07/26/2015), Arthritis, Arthritis, degenerative (03/26/2014), Back pain (11/01/2013), Breast cancer ([place]), Breast cancer ([place]), CHEST PAIN (04/29/2010), Chronic cystitis, Cystocele (02/22/2013), Cystocele, midline (08/19/2013), Degeneration of intervertebral disc of lumbosacral region (03/26/2014), DYSPNEA (04/29/2010), Enthesopathy  of hip (03/26/2014), GERD (gastroesophageal reflux disease), Hiatal hernia, HTN (hypertension), LBP (low back pain) (03/26/2014), Neck pain (11/01/2013), Parkinson disease ([place]), Sinusitis (02/07/2015), Skin lesions (07/12/2014), and Urinary incontinence. Surgical: Wanda Martinez  has a past surgical history that includes Breast biopsy (2013); Tonsillectomy and adenoidectomy (81); Vesicovaginal fistula closure w/ TAH (1983); Breast surgery (1986); Breast enhancement surgery (1987); Breast implant removal; Breast implant removal (Right, 08/29/2012); Mastectomy (08/2012); Abdominal hysterectomy; and Colonoscopy with propofol (N/A, 09/13/2016). Family: family history includes Colon polyps in her father; Diabetes in her father; Parkinson's disease in her mother; Stroke in her father and mother.  Constitutional Exam  General appearance: Well nourished, well developed, and well hydrated. In no apparent acute distress Vitals:   05/27/18 1016  BP: (!) 155/89  Pulse: 67  Resp: 16  Temp: 98.5 F (36.9 C)  TempSrc: Oral  SpO2: 99%  Weight: 180 lb (81.6 kg)  Height: 5\' 4"  (1.626 m)   BMI Assessment: Estimated body mass index is 30.9 kg/m as calculated from the following:   Height as of this encounter: 5\' 4"  (1.626 m).   Weight as of this encounter: 180 lb (81.6 kg).  BMI interpretation table: BMI level Category Range association with higher incidence of chronic pain  <18 kg/m2 Underweight   18.5-24.9 kg/m2 Ideal body weight   25-29.9 kg/m2 Overweight Increased incidence by 20%  30-34.9 kg/m2 Obese (Class I) Increased incidence by 68%  35-39.9 kg/m2 Severe obesity (Class II) Increased incidence by 136%  >40 kg/m2 Extreme obesity (Class III) Increased incidence by 254%   Patient's current BMI Ideal Body weight  Body mass index is 30.9 kg/m. Ideal body weight: 54.7 kg (120 lb 9.5 oz) Adjusted ideal body weight: 65.5 kg (144 lb 5.7 oz)   BMI Readings from Last 4 Encounters:  05/27/18 30.90 kg/m  05/09/18  30.90 kg/m  04/30/18 30.90 kg/m  04/22/18 31.58 kg/m   Wt Readings from Last 4 Encounters:  05/27/18 180 lb (81.6 kg)  05/09/18 180 lb (81.6 kg)  04/30/18 180 lb (81.6 kg)  04/22/18 184 lb (83.5 kg)  Psych/Mental status: Alert, oriented x 3 (person, place, & time)       Eyes: PERLA Respiratory: No evidence of acute respiratory distress  Cervical Spine Area Exam  Skin & Axial Inspection: No masses,  redness, edema, swelling, or associated skin lesions Alignment: Symmetrical Functional ROM: Unrestricted ROM      Stability: No instability detected Muscle Tone/Strength: Functionally intact. No obvious neuro-muscular anomalies detected. Sensory (Neurological): Unimpaired Palpation: No palpable anomalies              Upper Extremity (UE) Exam    Side: Right upper extremity  Side: Left upper extremity  Skin & Extremity Inspection: Skin color, temperature, and hair growth are WNL. No peripheral edema or cyanosis. No masses, redness, swelling, asymmetry, or associated skin lesions. No contractures.  Skin & Extremity Inspection: Skin color, temperature, and hair growth are WNL. No peripheral edema or cyanosis. No masses, redness, swelling, asymmetry, or associated skin lesions. No contractures.  Functional ROM: Unrestricted ROM          Functional ROM: Unrestricted ROM          Muscle Tone/Strength: Functionally intact. No obvious neuro-muscular anomalies detected.  Muscle Tone/Strength: Functionally intact. No obvious neuro-muscular anomalies detected.  Sensory (Neurological): Unimpaired          Sensory (Neurological): Unimpaired          Palpation: No palpable anomalies              Palpation: No palpable anomalies              Provocative Test(s):  Phalen's test: deferred Tinel's test: deferred Apley's scratch test (touch opposite shoulder):  Action 1 (Across chest): deferred Action 2 (Overhead): deferred Action 3 (LB reach): deferred   Provocative Test(s):  Phalen's test:  deferred Tinel's test: deferred Apley's scratch test (touch opposite shoulder):  Action 1 (Across chest): deferred Action 2 (Overhead): deferred Action 3 (LB reach): deferred    Thoracic Spine Area Exam  Skin & Axial Inspection: No masses, redness, or swelling Alignment: Symmetrical Functional ROM: Unrestricted ROM Stability: No instability detected Muscle Tone/Strength: Functionally intact. No obvious neuro-muscular anomalies detected. Sensory (Neurological): Unimpaired Muscle strength & Tone: No palpable anomalies  Lumbar Spine Area Exam  Skin & Axial Inspection: No masses, redness, or swelling Alignment: Symmetrical Functional ROM: Decreased ROM       Stability: No instability detected Muscle Tone/Strength: Functionally intact. No obvious neuro-muscular anomalies detected. Sensory (Neurological): Movement-associated pain Palpation: Complains of area being tender to palpation       Provocative Tests: Lumbar Hyperextension/rotation test: Positive bilaterally for facet joint pain. Lumbar quadrant test (Kemp's test): (+) bilaterally for facet joint pain. Lumbar Lateral bending test: (+) ipsilateral radicular pain, on the right. Positive for right-sided foraminal stenosis. Patrick's Maneuver: (+) for right-sided S-I arthralgia             FABER test: Positive for right-sided S-I arthralgia Thigh-thrust test: deferred today       S-I compression test: deferred today       S-I distraction test: deferred today        Gait & Posture Assessment  Ambulation: Limited Gait: Antalgic Posture: Difficulty standing up straight, due to pain   Lower Extremity Exam    Side: Right lower extremity  Side: Left lower extremity  Stability: No instability observed          Stability: No instability observed          Skin & Extremity Inspection: Skin color, temperature, and hair growth are WNL. No peripheral edema or cyanosis. No masses, redness, swelling, asymmetry, or associated skin lesions. No  contractures.  Skin & Extremity Inspection: Skin color, temperature, and hair growth are WNL. No peripheral edema  or cyanosis. No masses, redness, swelling, asymmetry, or associated skin lesions. No contractures.  Functional ROM: Unrestricted ROM                  Functional ROM: Unrestricted ROM                  Muscle Tone/Strength: Functionally intact. No obvious neuro-muscular anomalies detected.  Muscle Tone/Strength: Functionally intact. No obvious neuro-muscular anomalies detected.  Sensory (Neurological): Unimpaired  Sensory (Neurological): Unimpaired  Palpation: No palpable anomalies  Palpation: No palpable anomalies   Assessment  Primary Diagnosis & Pertinent Problem List: The primary encounter diagnosis was Chronic low back pain (Primary Source of Pain) (Bilateral) (R>L). Diagnoses of Lumbar facet syndrome (Bilateral) (R>L), Chronic sacroiliac joint pain (Right), Spondylosis without myelopathy or radiculopathy, lumbosacral region, DDD (degenerative disc disease), lumbosacral, Chronic lower extremity pain (Secondary Area of Pain) (Right), Primary osteoarthritis involving multiple joints, Diffuse myofascial pain syndrome, and Neurogenic pain were also pertinent to this visit.  Status Diagnosis  Persistent Recurring Recurring 1. Chronic low back pain (Primary Source of Pain) (Bilateral) (R>L)   2. Lumbar facet syndrome (Bilateral) (R>L)   3. Chronic sacroiliac joint pain (Right)   4. Spondylosis without myelopathy or radiculopathy, lumbosacral region   5. DDD (degenerative disc disease), lumbosacral   6. Chronic lower extremity pain (Secondary Area of Pain) (Right)   7. Primary osteoarthritis involving multiple joints   8. Diffuse myofascial pain syndrome   9. Neurogenic pain     Problems updated and reviewed during this visit: No problems updated. Plan of Care  Pharmacotherapy (Medications Ordered): Meds ordered this encounter  Medications  . cyclobenzaprine (FLEXERIL) 10 MG  tablet    Sig: Take 1 tablet (10 mg total) by mouth at bedtime.    Dispense:  90 tablet    Refill:  0    Do not place this medication, or any other prescription from our practice, on "Automatic Refill". Patient may have prescription filled one day early if pharmacy is closed on scheduled refill date.  . magnesium oxide (MAGNESIUM-OXIDE) 400 (241.3 Mg) MG tablet    Sig: Take 1 tablet (400 mg total) by mouth daily.    Dispense:  90 tablet    Refill:  0    Do not place this medication, or any other prescription from our practice, on "Automatic Refill". Patient may have prescription filled one day early if pharmacy is closed on scheduled refill date.  . gabapentin (NEURONTIN) 300 MG capsule    Sig: Take 1 capsule (300 mg total) by mouth 2 (two) times daily.    Dispense:  180 capsule    Refill:  0    Do not place this medication, or any other prescription from our practice, on "Automatic Refill". Patient may have prescription filled one day early if pharmacy is closed on scheduled refill date.  . meloxicam (MOBIC) 7.5 MG tablet    Sig: Take 1 tablet (7.5 mg total) by mouth daily.    Dispense:  90 tablet    Refill:  0    Do not place this medication, or any other prescription from our practice, on "Automatic Refill". Patient may have prescription filled one day early if pharmacy is closed on scheduled refill date.   Medications administered today: Eliska T. [employer]" had no medications administered during this visit.  Procedure Orders    No procedure(s) ordered today   Lab Orders  No laboratory test(s) ordered today   Imaging Orders  No imaging studies  ordered today   Referral Orders  No referral(s) requested today    Interventional management options: Planned, scheduled, and/or pending:   Palliative right-sided lumbar facet RFA #2 (06/04/18) Diagnostic right-sided L3-4 interlaminar LESI #3 + right sided L5 transforaminal LESI #3 under fluoroscopic guidance and IV sedation    Considering:   Diagnosticright-sided lumbar facet block  Palliative right-sided lumbar facet radiofrequency ablation  PalliativeRight suprascapular muscle trigger point injection  Diagnosticbilateral cervical facet block  Possiblebilateral cervical facet radiofrequency ablation  Diagnostic right-sided cervical epidural steroid injection  Diagnosticintra-articular hip joint injection  Possible hip joint radiofrequency ablation   Palliative PRN treatment(s):   Diagnostic right-sided lumbar facet block  Palliativeright-sided lumbar facet radiofrequency ablation  Palliative Right suprascapular muscle trigger point injection  Diagnosticbilateral cervical facet block  Diagnosticright-sided cervical epidural steroid injection Diagnosticintra-articular hip joint injection   Provider-requested follow-up: Return for RFA (fluoro + sedation): (R) L-FCT RFA.  Future Appointments  Date Time Provider [place]  06/04/2018 10:30 AM Milinda Pointer, MD [facility] None  06/25/2018  1:00 PM [facility] 1 [facility]  07/03/2018 11:00 AM Vevelyn Francois, NP [facility] None  07/09/2018  9:30 AM Einar Pheasant, MD [id number]  04/08/2019 11:15 AM Lloyd Huger, MD [facility] None   Primary Care Physician: Einar Pheasant, MD Location: [facility] Note by: Gaspar Cola, MD Date: 05/27/2018; Time: 11:44 AM

## 2018-05-27 NOTE — Patient Instructions (Addendum)
_________You have been informed to pick up 4 prescriptions at your pharmacy. ___________________________________________________________________________________  Preparing for Procedure with Sedation  Instructions: . Oral Intake: Do not eat or drink anything for at least 8 hours prior to your procedure. . Transportation: Public transportation is not allowed. Bring an adult driver. The driver must be physically present in our waiting room before any procedure can be started. Marland Kitchen Physical Assistance: Bring an adult physically capable of assisting you, in the event you need help. This adult should keep you company at home for at least 6 hours after the procedure. . Blood Pressure Medicine: Take your blood pressure medicine with a sip of water the morning of the procedure. . Blood thinners:  . Diabetics on insulin: Notify the staff so that you can be scheduled 1st case in the morning. If your diabetes requires high dose insulin, take only  of your normal insulin dose the morning of the procedure and notify the staff that you have done so. . Preventing infections: Shower with an antibacterial soap the morning of your procedure. . Build-up your immune system: Take 1000 mg of Vitamin C with every meal (3 times a day) the day prior to your procedure. Marland Kitchen Antibiotics: Inform the staff if you have a condition or reason that requires you to take antibiotics before dental procedures. . Pregnancy: If you are pregnant, call and cancel the procedure. . Sickness: If you have a cold, fever, or any active infections, call and cancel the procedure. . Arrival: You must be in the facility at least 30 minutes prior to your scheduled procedure. . Children: Do not bring children with you. . Dress appropriately: Bring dark clothing that you would not mind if they get stained. . Valuables: Do not bring any jewelry or valuables.  Procedure appointments are reserved for interventional treatments only. Marland Kitchen No Prescription  Refills. . No medication changes will be discussed during procedure appointments. . No disability issues will be discussed.  Remember:  Regular Business hours are:  Monday to Thursday 8:00 AM to 4:00 PM  Provider's Schedule: Milinda Pointer, MD:  Procedure days: Tuesday and Thursday 7:30 AM to 4:00 PM  Gillis Santa, MD:  Procedure days: Monday and Wednesday 7:30 AM to 4:00 PM ____________________________________________________________________________________________   ____________________________________________________________________________________________  Pain Scale  Introduction: The pain score used by this practice is the Verbal Numerical Rating Scale (VNRS-11). This is an 11-point scale. It is for adults and children 10 years or older. There are significant differences in how the pain score is reported, used, and applied. Forget everything you learned in the past and learn this scoring system.  General Information: The scale should reflect your current level of pain. Unless you are specifically asked for the level of your worst pain, or your average pain. If you are asked for one of these two, then it should be understood that it is over the past 24 hours.  Basic Activities of Daily Living (ADL): Personal hygiene, dressing, eating, transferring, and using restroom.  Instructions: Most patients tend to report their level of pain as a combination of two factors, their physical pain and their psychosocial pain. This last one is also known as "suffering" and it is reflection of how physical pain affects you socially and psychologically. From now on, report them separately. From this point on, when asked to report your pain level, report only your physical pain. Use the following table for reference.  Pain Clinic Pain Levels (0-5/10)  Pain Level Score  Description  No Pain  0   Mild pain 1 Nagging, annoying, but does not interfere with basic activities of daily living (ADL).  Patients are able to eat, bathe, get dressed, toileting (being able to get on and off the toilet and perform personal hygiene functions), transfer (move in and out of bed or a chair without assistance), and maintain continence (able to control bladder and bowel functions). Blood pressure and heart rate are unaffected. A normal heart rate for a healthy adult ranges from 60 to 100 bpm (beats per minute).   Mild to moderate pain 2 Noticeable and distracting. Impossible to hide from other people. More frequent flare-ups. Still possible to adapt and function close to normal. It can be very annoying and may have occasional stronger flare-ups. With discipline, patients may get used to it and adapt.   Moderate pain 3 Interferes significantly with activities of daily living (ADL). It becomes difficult to feed, bathe, get dressed, get on and off the toilet or to perform personal hygiene functions. Difficult to get in and out of bed or a chair without assistance. Very distracting. With effort, it can be ignored when deeply involved in activities.   Moderately severe pain 4 Impossible to ignore for more than a few minutes. With effort, patients may still be able to manage work or participate in some social activities. Very difficult to concentrate. Signs of autonomic nervous system discharge are evident: dilated pupils (mydriasis); mild sweating (diaphoresis); sleep interference. Heart rate becomes elevated (>115 bpm). Diastolic blood pressure (lower number) rises above 100 mmHg. Patients find relief in laying down and not moving.   Severe pain 5 Intense and extremely unpleasant. Associated with frowning face and frequent crying. Pain overwhelms the senses.  Ability to do any activity or maintain social relationships becomes significantly limited. Conversation becomes difficult. Pacing back and forth is common, as getting into a comfortable position is nearly impossible. Pain wakes you up from deep sleep. Physical  signs will be obvious: pupillary dilation; increased sweating; goosebumps; brisk reflexes; cold, clammy hands and feet; nausea, vomiting or dry heaves; loss of appetite; significant sleep disturbance with inability to fall asleep or to remain asleep. When persistent, significant weight loss is observed due to the complete loss of appetite and sleep deprivation.  Blood pressure and heart rate becomes significantly elevated. Caution: If elevated blood pressure triggers a pounding headache associated with blurred vision, then the patient should immediately seek attention at an urgent or emergency care unit, as these may be signs of an impending stroke.    Emergency Department Pain Levels (6-10/10)  Emergency Room Pain 6 Severely limiting. Requires emergency care and should not be seen or managed at an outpatient pain management facility. Communication becomes difficult and requires great effort. Assistance to reach the emergency department may be required. Facial flushing and profuse sweating along with potentially dangerous increases in heart rate and blood pressure will be evident.   Distressing pain 7 Self-care is very difficult. Assistance is required to transport, or use restroom. Assistance to reach the emergency department will be required. Tasks requiring coordination, such as bathing and getting dressed become very difficult.   Disabling pain 8 Self-care is no longer possible. At this level, pain is disabling. The individual is unable to do even the most "basic" activities such as walking, eating, bathing, dressing, transferring to a bed, or toileting. Fine motor skills are lost. It is difficult to think clearly.   Incapacitating pain 9 Pain becomes incapacitating. Thought processing is no longer possible. Difficult to  remember your own name. Control of movement and coordination are lost.   The worst pain imaginable 10 At this level, most patients pass out from pain. When this level is reached,  collapse of the autonomic nervous system occurs, leading to a sudden drop in blood pressure and heart rate. This in turn results in a temporary and dramatic drop in blood flow to the brain, leading to a loss of consciousness. Fainting is one of the body's self defense mechanisms. Passing out puts the brain in a calmed state and causes it to shut down for a while, in order to begin the healing process.    Summary: 1. Refer to this scale when providing [place] with your pain level. 2. Be accurate and careful when reporting your pain level. This will help with your care. 3. Over-reporting your pain level will lead to loss of credibility. 4. Even a level of 1/10 means that there is pain and will be treated at our facility. 5. High, inaccurate reporting will be documented as "Symptom Exaggeration", leading to loss of credibility and suspicions of possible secondary gains such as obtaining more narcotics, or wanting to appear disabled, for fraudulent reasons. 6. Only pain levels of 5 or below will be seen at our facility. 7. Pain levels of 6 and above will be sent to the Emergency Department and the appointment cancelled. ____________________________________________________________________________________________

## 2018-05-28 DIAGNOSIS — M6281 Muscle weakness (generalized): Secondary | ICD-10-CM | POA: Diagnosis not present

## 2018-05-28 DIAGNOSIS — M545 Low back pain: Secondary | ICD-10-CM | POA: Diagnosis not present

## 2018-06-03 NOTE — Progress Notes (Signed)
Patient's Name: Wanda Martinez  MRN: [id number]  Referring Provider: Einar Pheasant, MD  DOB: May 11, 1935  PCP: Einar Pheasant, MD  DOS: 06/04/2018  Note by: Gaspar Cola, MD  Service setting: Ambulatory outpatient  Specialty: Interventional Pain Management  Patient type: Established  Location: [facility] (AMB) Pain Management Facility  Visit type: Interventional Procedure   Primary Reason for Visit: Interventional Pain Management Treatment. CC: Back Pain (right)  Procedure:       Anesthesia, Analgesia, Anxiolysis:  Type: Thermal Lumbar Facet, Medial Branch Radiofrequency Neurotomy Level: L2, L3, L4, L5, & S1 Medial Branch Level(s). These levels will denervate the L3-4, L4-5, and the L5-S1 lumbar facet joints. Primary Purpose: Palliative Region: Posterolateral Lumbosacral Spine Laterality: Right  Type: Moderate (Conscious) Sedation combined with Local Anesthesia Indication(s): Analgesia and Anxiety Route: Intravenous (IV) IV Access: Secured Sedation: Meaningful verbal contact was maintained at all times during the procedure  Local Anesthetic: Lidocaine 1-2%   Indications: 1. Spondylosis without myelopathy or radiculopathy, lumbosacral region   2. Lumbar facet syndrome (Bilateral) (R>L)   3. Chronic low back pain (Primary Source of Pain) (Bilateral) (R>L)   4. DDD (degenerative disc disease), lumbosacral    Wanda Martinez has been dealing with the above chronic pain for longer than three months and has either failed to respond, was unable to tolerate, or simply did not get enough benefit from other more conservative therapies including, but not limited to: 1. Over-the-counter medications 2. Anti-inflammatory medications 3. Muscle relaxants 4. Membrane stabilizers 5. Opioids 6. Physical therapy 7. Modalities (Heat, ice, etc.) 8. Invasive techniques such as nerve blocks. Ms. Dye has attained more than 50% relief of the pain from a series of diagnostic injections conducted in  separate occasions.  Pain Score: Pre-procedure: 8 /10 Post-procedure: 0-No pain/10  Pre-op Assessment:  Wanda Martinez is a 83 y.o. (year old), female patient, seen today for interventional treatment. She  has a past surgical history that includes Breast biopsy (2013); Tonsillectomy and adenoidectomy (79); Vesicovaginal fistula closure w/ TAH (1983); Breast surgery (1986); Breast enhancement surgery (1987); Breast implant removal; Breast implant removal (Right, 08/29/2012); Mastectomy (08/2012); Abdominal hysterectomy; and Colonoscopy with propofol (N/A, 09/13/2016). Wanda Martinez has a current medication list which includes the following prescription(s): amlodipine, aspirin, calcium carbonate, citalopram, cyclobenzaprine, gabapentin, glucosamine sulfate, hydrochlorothiazide, hydrocodone-acetaminophen, losartan, lubiprostone, magnesium oxide, meloxicam, metoprolol succinate, multivitamin, omega-3, omeprazole, pantoprazole, pravastatin, gabapentin, magnesium oxide, and oxycodone-acetaminophen, and the following Facility-Administered Medications: fentanyl and midazolam. Her primarily concern today is the Back Pain (right)  The patient had the left side done on 02/12/2018.  Initial Vital Signs:  Pulse/HCG Rate: 62ECG Heart Rate: 68 Temp: 98.4 F (36.9 C) Resp: 16 BP: (!) 152/75 SpO2: 100 %  BMI: Estimated body mass index is 30.9 kg/m as calculated from the following:   Height as of this encounter: 5\' 4"  (1.626 m).   Weight as of this encounter: 180 lb (81.6 kg).  Risk Assessment: Allergies: Reviewed. She is allergic to sulfa antibiotics and vesicare [solifenacin].  Allergy Precautions: None required Coagulopathies: Reviewed. None identified.  Blood-thinner therapy: None at this time Active Infection(s): Reviewed. None identified. Wanda Martinez is afebrile  Site Confirmation: Wanda Martinez was asked to confirm the procedure and laterality before marking the site Procedure checklist: Completed Consent:  Before the procedure and under the influence of no sedative(s), amnesic(s), or anxiolytics, the patient was informed of the treatment options, risks and possible complications. To fulfill our ethical and legal obligations, as recommended by the American Medical Association's Code of  Ethics, I have informed the patient of my clinical impression; the nature and purpose of the treatment or procedure; the risks, benefits, and possible complications of the intervention; the alternatives, including doing nothing; the risk(s) and benefit(s) of the alternative treatment(s) or procedure(s); and the risk(s) and benefit(s) of doing nothing. The patient was provided information about the general risks and possible complications associated with the procedure. These may include, but are not limited to: failure to achieve desired goals, infection, bleeding, organ or nerve damage, allergic reactions, paralysis, and death. In addition, the patient was informed of those risks and complications associated to Spine-related procedures, such as failure to decrease pain; infection (i.e.: Meningitis, epidural or intraspinal abscess); bleeding (i.e.: epidural hematoma, subarachnoid hemorrhage, or any other type of intraspinal or peri-dural bleeding); organ or nerve damage (i.e.: Any type of peripheral nerve, nerve root, or spinal cord injury) with subsequent damage to sensory, motor, and/or autonomic systems, resulting in permanent pain, numbness, and/or weakness of one or several areas of the body; allergic reactions; (i.e.: anaphylactic reaction); and/or death. Furthermore, the patient was informed of those risks and complications associated with the medications. These include, but are not limited to: allergic reactions (i.e.: anaphylactic or anaphylactoid reaction(s)); adrenal axis suppression; blood sugar elevation that in diabetics may result in ketoacidosis or comma; water retention that in patients with history of congestive heart  failure may result in shortness of breath, pulmonary edema, and decompensation with resultant heart failure; weight gain; swelling or edema; medication-induced neural toxicity; particulate matter embolism and blood vessel occlusion with resultant organ, and/or nervous system infarction; and/or aseptic necrosis of one or more joints. Finally, the patient was informed that Medicine is not an exact science; therefore, there is also the possibility of unforeseen or unpredictable risks and/or possible complications that may result in a catastrophic outcome. The patient indicated having understood very clearly. We have given the patient no guarantees and we have made no promises. Enough time was given to the patient to ask questions, all of which were answered to the patient's satisfaction. Ms. Dietzman has indicated that she wanted to continue with the procedure. Attestation: I, the ordering provider, attest that I have discussed with the patient the benefits, risks, side-effects, alternatives, likelihood of achieving goals, and potential problems during recovery for the procedure that I have provided informed consent. Date  Time: 06/04/2018 10:27 AM  Pre-Procedure Preparation:  Monitoring: As per clinic protocol. Respiration, ETCO2, SpO2, BP, heart rate and rhythm monitor placed and checked for adequate function Safety Precautions: Patient was assessed for positional comfort and pressure points before starting the procedure. Time-out: I initiated and conducted the "Time-out" before starting the procedure, as per protocol. The patient was asked to participate by confirming the accuracy of the "Time Out" information. Verification of the correct person, site, and procedure were performed and confirmed by me, the nursing staff, and the patient. "Time-out" conducted as per Joint Commission's Universal Protocol (UP.01.01.01). Time: 1109  Description of Procedure:       Position: Prone Laterality: Right Levels:   L2, L3, L4, L5, & S1 Medial Branch Level(s), at the L3-4, L4-5, and the L5-S1 lumbar facet joints. Area Prepped: Lumbosacral Prepping solution: ChloraPrep (2% chlorhexidine gluconate and 70% isopropyl alcohol) Safety Precautions: Aspiration looking for blood return was conducted prior to all injections. At no point did we inject any substances, as a needle was being advanced. Before injecting, the patient was told to immediately notify me if she was experiencing any new onset of "ringing  in the ears, or metallic taste in the mouth". No attempts were made at seeking any paresthesias. Safe injection practices and needle disposal techniques used. Medications properly checked for expiration dates. SDV (single dose vial) medications used. After the completion of the procedure, all disposable equipment used was discarded in the proper designated medical waste containers. Local Anesthesia: Protocol guidelines were followed. The patient was positioned over the fluoroscopy table. The area was prepped in the usual manner. The time-out was completed. The target area was identified using fluoroscopy. A 12-in long, straight, sterile hemostat was used with fluoroscopic guidance to locate the targets for each level blocked. Once located, the skin was marked with an approved surgical skin marker. Once all sites were marked, the skin (epidermis, dermis, and hypodermis), as well as deeper tissues (fat, connective tissue and muscle) were infiltrated with a small amount of a short-acting local anesthetic, loaded on a 10cc syringe with a 25G, 1.5-in  Needle. An appropriate amount of time was allowed for local anesthetics to take effect before proceeding to the next step. Local Anesthetic: Lidocaine 2.0% The unused portion of the local anesthetic was discarded in the proper designated containers. Technical explanation of process:  Radiofrequency Ablation (RFA) L2 Medial Branch Nerve RFA: The target area for the L2 medial branch  is at the junction of the postero-lateral aspect of the superior articular process and the superior, posterior, and medial edge of the transverse process of L3. Under fluoroscopic guidance, a Radiofrequency needle was inserted until contact was made with os over the superior postero-lateral aspect of the pedicular shadow (target area). Sensory and motor testing was conducted to properly adjust the position of the needle. Once satisfactory placement of the needle was achieved, the numbing solution was slowly injected after negative aspiration for blood. 2.0 mL of the nerve block solution was injected without difficulty or complication. After waiting for at least 3 minutes, the ablation was performed. Once completed, the needle was removed intact. L3 Medial Branch Nerve RFA: The target area for the L3 medial branch is at the junction of the postero-lateral aspect of the superior articular process and the superior, posterior, and medial edge of the transverse process of L4. Under fluoroscopic guidance, a Radiofrequency needle was inserted until contact was made with os over the superior postero-lateral aspect of the pedicular shadow (target area). Sensory and motor testing was conducted to properly adjust the position of the needle. Once satisfactory placement of the needle was achieved, the numbing solution was slowly injected after negative aspiration for blood. 2.0 mL of the nerve block solution was injected without difficulty or complication. After waiting for at least 3 minutes, the ablation was performed. Once completed, the needle was removed intact. L4 Medial Branch Nerve RFA: The target area for the L4 medial branch is at the junction of the postero-lateral aspect of the superior articular process and the superior, posterior, and medial edge of the transverse process of L5. Under fluoroscopic guidance, a Radiofrequency needle was inserted until contact was made with os over the superior postero-lateral  aspect of the pedicular shadow (target area). Sensory and motor testing was conducted to properly adjust the position of the needle. Once satisfactory placement of the needle was achieved, the numbing solution was slowly injected after negative aspiration for blood. 2.0 mL of the nerve block solution was injected without difficulty or complication. After waiting for at least 3 minutes, the ablation was performed. Once completed, the needle was removed intact. L5 Medial Branch Nerve RFA: The  target area for the L5 medial branch is at the junction of the postero-lateral aspect of the superior articular process of S1 and the superior, posterior, and medial edge of the sacral ala. Under fluoroscopic guidance, a Radiofrequency needle was inserted until contact was made with os over the superior postero-lateral aspect of the pedicular shadow (target area). Sensory and motor testing was conducted to properly adjust the position of the needle. Once satisfactory placement of the needle was achieved, the numbing solution was slowly injected after negative aspiration for blood. 2.0 mL of the nerve block solution was injected without difficulty or complication. After waiting for at least 3 minutes, the ablation was performed. Once completed, the needle was removed intact. S1 Medial Branch Nerve RFA: The target area for the S1 medial branch is located inferior to the junction of the S1 superior articular process and the L5 inferior articular process, posterior, inferior, and lateral to the 6 o'clock position of the L5-S1 facet joint, just superior to the S1 posterior foramen. Under fluoroscopic guidance, the Radiofrequency needle was advanced until contact was made with os over the Target area. Sensory and motor testing was conducted to properly adjust the position of the needle. Once satisfactory placement of the needle was achieved, the numbing solution was slowly injected after negative aspiration for blood. 2.0 mL of the  nerve block solution was injected without difficulty or complication. After waiting for at least 3 minutes, the ablation was performed. Once completed, the needle was removed intact. Radiofrequency lesioning (ablation):  Radiofrequency Generator: NeuroTherm [id number] Sensory Stimulation Parameters: 50 Hz was used to locate & identify the nerve, making sure that the needle was positioned such that there was no sensory stimulation below 0.3 V or above 0.7 V. Motor Stimulation Parameters: 2 Hz was used to evaluate the motor component. Care was taken not to lesion any nerves that demonstrated motor stimulation of the lower extremities at an output of less than 2.5 times that of the sensory threshold, or a maximum of 2.0 V. Lesioning Technique Parameters: Standard Radiofrequency settings. (Not bipolar or pulsed.) Temperature Settings: 80 degrees C Lesioning time: 60 seconds Intra-operative Compliance: Compliant Materials & Medications: Needle(s) (Electrode/Cannula) Type: Teflon-coated, curved tip, Radiofrequency needle(s) Gauge: 22G Length: 10cm Numbing solution: 0.2% PF-Ropivacaine + Triamcinolone (40 mg/mL) diluted to a final concentration of 4 mg of Triamcinolone/mL of Ropivacaine The unused portion of the solution was discarded in the proper designated containers.  Once the entire procedure was completed, the treated area was cleaned, making sure to leave some of the prepping solution back to take advantage of its long term bactericidal properties.  Illustration of the posterior view of the lumbar spine and the posterior neural structures. Laminae of L2 through S1 are labeled. DPRL5, dorsal primary ramus of L5; DPRS1, dorsal primary ramus of S1; DPR3, dorsal primary ramus of L3; FJ, facet (zygapophyseal) joint L3-L4; I, inferior articular process of L4; LB1, lateral branch of dorsal primary ramus of L1; IAB, inferior articular branches from L3 medial branch (supplies L4-L5 facet joint); IBP,  intermediate branch plexus; MB3, medial branch of dorsal primary ramus of L3; NR3, third lumbar nerve root; S, superior articular process of L5; SAB, superior articular branches from L4 (supplies L4-5 facet joint also); TP3, transverse process of L3.  Vitals:   06/04/18 1148 06/04/18 1158 06/04/18 1207 06/04/18 1218  BP: (!) 153/83 (!) 156/79 (!) 160/87 (!) 159/73  Pulse:      Resp: 16 16 18 14   Temp: 98.3 F (36.8 C)   (!)  97.4 F (36.3 C)  TempSrc: Temporal     SpO2: 98% 97% 97% 100%  Weight:      Height:        Start Time: 1110 hrs. End Time: 1146 hrs.  Imaging Guidance (Spinal):  Type of Imaging Technique: Fluoroscopy Guidance (Spinal) Indication(s): Assistance in needle guidance and placement for procedures requiring needle placement in or near specific anatomical locations not easily accessible without such assistance. Exposure Time: Please see nurses notes. Contrast: None used. Fluoroscopic Guidance: I was personally present during the use of fluoroscopy. "Tunnel Vision Technique" used to obtain the best possible view of the target area. Parallax error corrected before commencing the procedure. "Direction-depth-direction" technique used to introduce the needle under continuous pulsed fluoroscopy. Once target was reached, antero-posterior, oblique, and lateral fluoroscopic projection used confirm needle placement in all planes. Images permanently stored in EMR. Interpretation: No contrast injected. I personally interpreted the imaging intraoperatively. Adequate needle placement confirmed in multiple planes. Permanent images saved into the patient's record.  Antibiotic Prophylaxis:   Anti-infectives (From admission, onward)   None     Indication(s): None identified  Post-operative Assessment:  Post-procedure Vital Signs:  Pulse/HCG Rate: 6260 Temp: (!) 97.4 F (36.3 C) Resp: 14 BP: (!) 159/73 SpO2: 100 %  EBL: None  Complications: No immediate post-treatment  complications observed by team, or reported by patient.  Note: The patient tolerated the entire procedure well. A repeat set of vitals were taken after the procedure and the patient was kept under observation following institutional policy, for this type of procedure. Post-procedural neurological assessment was performed, showing return to baseline, prior to discharge. The patient was provided with post-procedure discharge instructions, including a section on how to identify potential problems. Should any problems arise concerning this procedure, the patient was given instructions to immediately contact us, at any time, without hesitation. In any case, we plan to contact the patient by telephone for a follow-up status report regarding this interventional procedure.  Comments:  No additional relevant information.  Plan of Care    Imaging Orders     DG C-Arm 1-60 Min-No Report  Procedure Orders     Radiofrequency,Lumbar  Medications ordered for procedure: Meds ordered this encounter  Medications  . lidocaine (XYLOCAINE) 2 % (with pres) injection 400 mg  . midazolam (VERSED) 5 MG/5ML injection 1-2 mg    Make sure Flumazenil is available in the pyxis when using this medication. If oversedation occurs, administer 0.2 mg IV over 15 sec. If after 45 sec no response, administer 0.2 mg again over 1 min; may repeat at 1 min intervals; not to exceed 4 doses (1 mg)  . fentaNYL (SUBLIMAZE) injection 25-50 mcg    Make sure Narcan is available in the pyxis when using this medication. In the event of respiratory depression (RR< 8/min): Titrate NARCAN (naloxone) in increments of 0.1 to 0.2 mg IV at 2-3 minute intervals, until desired degree of reversal.  . lactated ringers infusion 1,000 mL  . ropivacaine (PF) 2 mg/mL (0.2%) (NAROPIN) injection 9 mL  . triamcinolone acetonide (KENALOG-40) injection 40 mg  . oxyCODONE-acetaminophen (PERCOCET) 5-325 MG tablet    Sig: Take 1 tablet by mouth every 8 (eight)  hours as needed for up to 7 days for severe pain.    Dispense:  21 tablet    Refill:  0    For acute post-operative pain. Not to be refilled. To last 7 days.   Medications administered: We administered lidocaine, midazolam, fentaNYL, lactated ringers, ropivacaine (  PF) 2 mg/mL (0.2%), and triamcinolone acetonide.  See the medical record for exact dosing, route, and time of administration.  New Prescriptions   OXYCODONE-ACETAMINOPHEN (PERCOCET) 5-325 MG TABLET    Take 1 tablet by mouth every 8 (eight) hours as needed for up to 7 days for severe pain.   Disposition: Discharge home  Discharge Date & Time: 06/04/2018; 1219 hrs.   Physician-requested Follow-up: No follow-ups on file.  Future Appointments  Date Time Provider [place]  06/25/2018  1:00 PM [facility]  07/03/2018 11:00 AM Vevelyn Francois, NP [facility] None  07/09/2018  9:30 AM Einar Pheasant, MD [facility]  07/17/2018  1:15 PM Milinda Pointer, MD [facility] None  04/08/2019 11:15 AM Lloyd Huger, MD [facility] None   Primary Care Physician: Einar Pheasant, MD Location: [facility] Note by: Gaspar Cola, MD Date: 06/04/2018; Time: 12:48 PM  Disclaimer:  Medicine is not an Chief Strategy Officer. The only guarantee in medicine is that nothing is guaranteed. It is important to note that the decision to proceed with this intervention was based on the information collected from the patient. The Data and conclusions were drawn from the patient's questionnaire, the interview, and the physical examination. Because the information was provided in large part by the patient, it cannot be guaranteed that it has not been purposely or unconsciously manipulated. Every effort has been made to obtain as much relevant data as possible for this evaluation. It is important to note that the conclusions that lead to this procedure are derived in large part from the available data. Always  take into account that the treatment will also be dependent on availability of resources and existing treatment guidelines, considered by other Pain Management Practitioners as being common knowledge and practice, at the time of the intervention. For Medico-Legal purposes, it is also important to point out that variation in procedural techniques and pharmacological choices are the acceptable norm. The indications, contraindications, technique, and results of the above procedure should only be interpreted and judged by a Board-Certified Interventional Pain Specialist with extensive familiarity and expertise in the same exact procedure and technique.

## 2018-06-04 ENCOUNTER — Ambulatory Visit
Admission: RE | Admit: 2018-06-04 | Discharge: 2018-06-04 | Disposition: A | Discharge status: Home / Self Care | Payer: Medicare Other | Source: Ambulatory Visit | Attending: Pain Medicine | Admitting: Pain Medicine

## 2018-06-04 ENCOUNTER — Other Ambulatory Visit: Payer: Self-pay

## 2018-06-04 ENCOUNTER — Encounter: Payer: Self-pay | Admitting: Pain Medicine

## 2018-06-04 ENCOUNTER — Ambulatory Visit (HOSPITAL_BASED_OUTPATIENT_CLINIC_OR_DEPARTMENT_OTHER): Payer: Medicare Other | Admitting: Pain Medicine

## 2018-06-04 VITALS — BP 159/73 | HR 62 | Temp 97.4°F | Resp 14 | Ht 64.0 in | Wt 180.0 lb

## 2018-06-04 DIAGNOSIS — M47817 Spondylosis without myelopathy or radiculopathy, lumbosacral region: Secondary | ICD-10-CM | POA: Insufficient documentation

## 2018-06-04 DIAGNOSIS — M5137 Other intervertebral disc degeneration, lumbosacral region: Secondary | ICD-10-CM | POA: Diagnosis not present

## 2018-06-04 DIAGNOSIS — Z882 Allergy status to sulfonamides status: Secondary | ICD-10-CM | POA: Diagnosis not present

## 2018-06-04 DIAGNOSIS — M545 Low back pain: Secondary | ICD-10-CM

## 2018-06-04 DIAGNOSIS — G8918 Other acute postprocedural pain: Secondary | ICD-10-CM

## 2018-06-04 DIAGNOSIS — G8929 Other chronic pain: Secondary | ICD-10-CM | POA: Diagnosis not present

## 2018-06-04 DIAGNOSIS — M47816 Spondylosis without myelopathy or radiculopathy, lumbar region: Secondary | ICD-10-CM

## 2018-06-04 MED ORDER — FENTANYL CITRATE (PF) 100 MCG/2ML IJ SOLN
25.0000 ug | INTRAMUSCULAR | Status: DC | PRN
  Administered 2018-06-04: 50 ug via INTRAVENOUS
  Filled 2018-06-04: qty 2

## 2018-06-04 MED ORDER — ROPIVACAINE HCL 2 MG/ML IJ SOLN
9.0000 mL | Freq: Once | INTRAMUSCULAR | Status: AC
  Administered 2018-06-04: 10 mL via PERINEURAL
  Filled 2018-06-04: qty 10

## 2018-06-04 MED ORDER — LIDOCAINE HCL 2 % IJ SOLN
20.0000 mL | Freq: Once | INTRAMUSCULAR | Status: AC
  Administered 2018-06-04: 400 mg
  Filled 2018-06-04: qty 40

## 2018-06-04 MED ORDER — TRIAMCINOLONE ACETONIDE 40 MG/ML IJ SUSP
40.0000 mg | Freq: Once | INTRAMUSCULAR | Status: AC
  Administered 2018-06-04: 40 mg
  Filled 2018-06-04: qty 1

## 2018-06-04 MED ORDER — LACTATED RINGERS IV SOLN
1000.0000 mL | Freq: Once | INTRAVENOUS | Status: AC
  Administered 2018-06-04: 1000 mL via INTRAVENOUS

## 2018-06-04 MED ORDER — OXYCODONE-ACETAMINOPHEN 5-325 MG PO TABS: 1.0000 | ORAL_TABLET | Freq: Three times a day (TID) | ORAL | 0 refills | Status: AC | PRN

## 2018-06-04 MED ORDER — MIDAZOLAM HCL 5 MG/5ML IJ SOLN
1.0000 mg | INTRAMUSCULAR | Status: DC | PRN
  Administered 2018-06-04: 3 mg via INTRAVENOUS
  Filled 2018-06-04: qty 5

## 2018-06-04 NOTE — Patient Instructions (Addendum)
____________________________________________________________________________________________  Post-Procedure Discharge Instructions  Instructions:  Apply ice: Fill a plastic sandwich bag with crushed ice. Cover it with a small towel and apply to injection site. Apply for 15 minutes then remove x 15 minutes. Repeat sequence on day of procedure, until you go to bed. The purpose is to minimize swelling and discomfort after procedure.  Apply heat: Apply heat to procedure site starting the day following the procedure. The purpose is to treat any soreness and discomfort from the procedure.  Food intake: Start with clear liquids (like water) and advance to regular food, as tolerated.   Physical activities: Keep activities to a minimum for the first 8 hours after the procedure.   Driving: If you have received any sedation, you are not allowed to drive for 24 hours after your procedure.  Blood thinner: Restart your blood thinner 6 hours after your procedure. (Only for those taking blood thinners)  Insulin: As soon as you can eat, you may resume your normal dosing schedule. (Only for those taking insulin)  Infection prevention: Keep procedure site clean and dry.  Post-procedure Pain Diary: Extremely important that this be done correctly and accurately. Recorded information will be used to determine the next step in treatment.  Pain evaluated is that of treated area only. Do not include pain from an untreated area.  Complete every hour, on the hour, for the initial 8 hours. Set an alarm to help you do this part accurately.  Do not go to sleep and have it completed later. It will not be accurate.  Follow-up appointment: Keep your follow-up appointment after the procedure. Usually 2 weeks for most procedures. (6 weeks in the case of radiofrequency.) Bring you pain diary.   Expect:  From numbing medicine (AKA: Local Anesthetics): Numbness or decrease in pain.  Onset: Full effect within 15  minutes of injected.  Duration: It will depend on the type of local anesthetic used. On the average, 1 to 8 hours.   From steroids: Decrease in swelling or inflammation. Once inflammation is improved, relief of the pain will follow.  Onset of benefits: Depends on the amount of swelling present. The more swelling, the longer it will take for the benefits to be seen. In some cases, up to 10 days.  Duration: Steroids will stay in the system x 2 weeks. Duration of benefits will depend on multiple posibilities including persistent irritating factors.  From procedure: Some discomfort is to be expected once the numbing medicine wears off. This should be minimal if ice and heat are applied as instructed.  Call if:  You experience numbness and weakness that gets worse with time, as opposed to wearing off.  New onset bowel or bladder incontinence. (This applies to Spinal procedures only)  Emergency Numbers:  Durning business hours (Monday - Thursday, 8:00 AM - 4:00 PM) (Friday, 9:00 AM - 12:00 Noon): [phone]  After hours: [phone] ____________________________________________________________________________________________   Pain Management Discharge Instructions  General Discharge Instructions :  If you need to reach your doctor call: Monday-Friday 8:00 am - 4:00 pm at [phone] or toll free [phone].  After clinic hours [phone] to have operator reach doctor.  Bring all of your medication bottles to all your appointments in the pain clinic.  To cancel or reschedule your appointment with Pain Management please remember to call 24 hours in advance to avoid a fee.  Refer to the educational materials which you have been given on: General Risks, I had my Procedure. Discharge Instructions, Post Sedation.    Post Procedure Instructions:  The drugs you were given will stay in your system until tomorrow, so for the next 24 hours you should not drive, make any legal  decisions or drink any alcoholic beverages.  You may eat anything you prefer, but it is better to start with liquids then soups and crackers, and gradually work up to solid foods.  Please notify your doctor immediately if you have any unusual bleeding, trouble breathing or pain that is not related to your normal pain.  Depending on the type of procedure that was done, some parts of your body may feel week and/or numb.  This usually clears up by tonight or the next day.  Walk with the use of an assistive device or accompanied by an adult for the 24 hours.  You may use ice on the affected area for the first 24 hours.  Put ice in a Ziploc bag and cover with a towel and place against area 15 minutes on 15 minutes off.  You may switch to heat after 24 hours.Radiofrequency Lesioning Radiofrequency lesioning is a procedure that is performed to relieve pain. The procedure is often used for back, neck, or arm pain. Radiofrequency lesioning involves the use of a machine that creates radio waves to make heat. During the procedure, the heat is applied to the nerve that carries the pain signal. The heat damages the nerve and interferes with the pain signal. Pain relief usually starts about 2 weeks after the procedure and lasts for 6 months to 1 year. Tell a health care provider about:  Any allergies you have.  All medicines you are taking, including vitamins, herbs, eye drops, creams, and over-the-counter medicines.  Any problems you or family members have had with anesthetic medicines.  Any blood disorders you have.  Any surgeries you have had.  Any medical conditions you have.  Whether you are pregnant or may be pregnant. What are the risks? Generally, this is a safe procedure. However, problems may occur, including:  Pain or soreness at the injection site.  Infection at the injection site.  Damage to nerves or blood vessels.  What happens before the procedure?  Ask your health care  provider about: ? Changing or stopping your regular medicines. This is especially important if you are taking diabetes medicines or blood thinners. ? Taking medicines such as aspirin and ibuprofen. These medicines can thin your blood. Do not take these medicines before your procedure if your health care provider instructs you not to.  Follow instructions from your health care provider about eating or drinking restrictions.  Plan to have someone take you home after the procedure.  If you go home right after the procedure, plan to have someone with you for 24 hours. What happens during the procedure?  You will be given one or more of the following: ? A medicine to help you relax (sedative). ? A medicine to numb the area (local anesthetic).  You will be awake during the procedure. You will need to be able to talk with the health care provider during the procedure.  With the help of a type of X-ray (fluoroscopy), the health care provider will insert a radiofrequency needle into the area to be treated.  Next, a wire that carries the radio waves (electrode) will be put through the radiofrequency needle. An electrical pulse will be sent through the electrode to verify the correct nerve. You will feel a tingling sensation, and you may have muscle twitching.  Then, the tissue that  is around the needle tip will be heated by an electric current that is passed using the radiofrequency machine. This will numb the nerves.  A bandage (dressing) will be put on the insertion area after the procedure is done. The procedure may vary among health care providers and hospitals. What happens after the procedure?  Your blood pressure, heart rate, breathing rate, and blood oxygen level will be monitored often until the medicines you were given have worn off.  Return to your normal activities as directed by your health care provider. This information is not intended to replace advice given to you by your health  care provider. Make sure you discuss any questions you have with your health care provider. Document Released: 08/02/2011 Document Revised: 05/11/2016 Document Reviewed: 01/11/2015 Elsevier Interactive Patient Education  [employer].

## 2018-06-05 NOTE — Telephone Encounter (Signed)
Spoke with patient's husband, patient is still sleeping this morning but he reports that she was doing okay last night.  He will let her know that we called and if she needs anything she will call us back.

## 2018-06-07 DIAGNOSIS — M6281 Muscle weakness (generalized): Secondary | ICD-10-CM | POA: Diagnosis not present

## 2018-06-07 DIAGNOSIS — M545 Low back pain: Secondary | ICD-10-CM | POA: Diagnosis not present

## 2018-06-17 DIAGNOSIS — E876 Hypokalemia: Secondary | ICD-10-CM

## 2018-06-17 DIAGNOSIS — E871 Hypo-osmolality and hyponatremia: Secondary | ICD-10-CM

## 2018-06-17 DIAGNOSIS — N3090 Cystitis, unspecified without hematuria: Secondary | ICD-10-CM

## 2018-06-17 DIAGNOSIS — J189 Pneumonia, unspecified organism: Secondary | ICD-10-CM

## 2018-06-17 HISTORY — DX: Cystitis, unspecified without hematuria: N30.90

## 2018-06-17 HISTORY — DX: Pneumonia, unspecified organism: J18.9

## 2018-06-17 HISTORY — DX: Hypokalemia: E87.6

## 2018-06-17 HISTORY — DX: Hypo-osmolality and hyponatremia: E87.1

## 2018-06-21 ENCOUNTER — Other Ambulatory Visit: Payer: Self-pay | Admitting: Internal Medicine

## 2018-06-25 ENCOUNTER — Other Ambulatory Visit: Payer: Medicare Other

## 2018-06-29 DIAGNOSIS — R509 Fever, unspecified: Secondary | ICD-10-CM | POA: Diagnosis not present

## 2018-06-29 DIAGNOSIS — R05 Cough: Secondary | ICD-10-CM | POA: Diagnosis not present

## 2018-06-29 DIAGNOSIS — R51 Headache: Secondary | ICD-10-CM | POA: Diagnosis not present

## 2018-06-29 DIAGNOSIS — G8929 Other chronic pain: Secondary | ICD-10-CM | POA: Diagnosis not present

## 2018-06-29 DIAGNOSIS — R350 Frequency of micturition: Secondary | ICD-10-CM | POA: Diagnosis not present

## 2018-06-29 DIAGNOSIS — M549 Dorsalgia, unspecified: Secondary | ICD-10-CM | POA: Diagnosis not present

## 2018-06-29 DIAGNOSIS — N182 Chronic kidney disease, stage 2 (mild): Secondary | ICD-10-CM | POA: Diagnosis not present

## 2018-06-29 DIAGNOSIS — J069 Acute upper respiratory infection, unspecified: Secondary | ICD-10-CM | POA: Diagnosis not present

## 2018-06-29 DIAGNOSIS — E871 Hypo-osmolality and hyponatremia: Secondary | ICD-10-CM | POA: Diagnosis not present

## 2018-06-29 DIAGNOSIS — C50911 Malignant neoplasm of unspecified site of right female breast: Secondary | ICD-10-CM | POA: Diagnosis not present

## 2018-06-29 DIAGNOSIS — I129 Hypertensive chronic kidney disease with stage 1 through stage 4 chronic kidney disease, or unspecified chronic kidney disease: Secondary | ICD-10-CM | POA: Diagnosis not present

## 2018-06-29 DIAGNOSIS — R251 Tremor, unspecified: Secondary | ICD-10-CM | POA: Diagnosis not present

## 2018-06-29 DIAGNOSIS — K219 Gastro-esophageal reflux disease without esophagitis: Secondary | ICD-10-CM | POA: Diagnosis not present

## 2018-06-29 DIAGNOSIS — R011 Cardiac murmur, unspecified: Secondary | ICD-10-CM | POA: Diagnosis not present

## 2018-06-29 DIAGNOSIS — N3946 Mixed incontinence: Secondary | ICD-10-CM | POA: Diagnosis not present

## 2018-06-29 DIAGNOSIS — E785 Hyperlipidemia, unspecified: Secondary | ICD-10-CM | POA: Diagnosis not present

## 2018-06-29 DIAGNOSIS — M419 Scoliosis, unspecified: Secondary | ICD-10-CM | POA: Diagnosis not present

## 2018-06-29 DIAGNOSIS — Z9013 Acquired absence of bilateral breasts and nipples: Secondary | ICD-10-CM | POA: Diagnosis not present

## 2018-06-30 DIAGNOSIS — K5903 Drug induced constipation: Secondary | ICD-10-CM | POA: Insufficient documentation

## 2018-06-30 DIAGNOSIS — G8929 Other chronic pain: Secondary | ICD-10-CM | POA: Diagnosis not present

## 2018-06-30 DIAGNOSIS — E871 Hypo-osmolality and hyponatremia: Secondary | ICD-10-CM | POA: Diagnosis not present

## 2018-06-30 DIAGNOSIS — K219 Gastro-esophageal reflux disease without esophagitis: Secondary | ICD-10-CM | POA: Insufficient documentation

## 2018-06-30 DIAGNOSIS — T402X5A Adverse effect of other opioids, initial encounter: Secondary | ICD-10-CM | POA: Insufficient documentation

## 2018-06-30 DIAGNOSIS — R05 Cough: Secondary | ICD-10-CM | POA: Diagnosis not present

## 2018-06-30 DIAGNOSIS — E876 Hypokalemia: Secondary | ICD-10-CM | POA: Diagnosis not present

## 2018-06-30 DIAGNOSIS — R918 Other nonspecific abnormal finding of lung field: Secondary | ICD-10-CM | POA: Diagnosis not present

## 2018-06-30 DIAGNOSIS — W19XXXA Unspecified fall, initial encounter: Secondary | ICD-10-CM | POA: Insufficient documentation

## 2018-06-30 DIAGNOSIS — J069 Acute upper respiratory infection, unspecified: Secondary | ICD-10-CM | POA: Diagnosis not present

## 2018-07-01 ENCOUNTER — Telehealth: Payer: Self-pay

## 2018-07-01 DIAGNOSIS — E876 Hypokalemia: Secondary | ICD-10-CM | POA: Diagnosis not present

## 2018-07-01 DIAGNOSIS — E871 Hypo-osmolality and hyponatremia: Secondary | ICD-10-CM | POA: Diagnosis not present

## 2018-07-01 DIAGNOSIS — W19XXXA Unspecified fall, initial encounter: Secondary | ICD-10-CM | POA: Diagnosis not present

## 2018-07-01 DIAGNOSIS — J189 Pneumonia, unspecified organism: Secondary | ICD-10-CM | POA: Diagnosis not present

## 2018-07-01 MED ORDER — GABAPENTIN 100 MG PO CAPS
200.00 | ORAL_CAPSULE | ORAL | Status: DC
Start: 2018-07-01 — End: 2018-07-01

## 2018-07-01 MED ORDER — ENOXAPARIN SODIUM 40 MG/0.4ML ~~LOC~~ SOLN
40.00 | SUBCUTANEOUS | Status: DC
Start: 2018-07-02 — End: 2018-07-01

## 2018-07-01 MED ORDER — PRAVASTATIN SODIUM 20 MG PO TABS
20.00 | ORAL_TABLET | ORAL | Status: DC
Start: 2018-07-02 — End: 2018-07-01

## 2018-07-01 MED ORDER — PANTOPRAZOLE SODIUM 20 MG PO TBEC
40.00 | DELAYED_RELEASE_TABLET | ORAL | Status: DC
Start: 2018-07-02 — End: 2018-07-01

## 2018-07-01 MED ORDER — GENERIC EXTERNAL MEDICATION
2.00 | Status: DC
Start: 2018-07-01 — End: 2018-07-01

## 2018-07-01 MED ORDER — HYDROCODONE-ACETAMINOPHEN 5-325 MG PO TABS
1.00 | ORAL_TABLET | ORAL | Status: DC
Start: ? — End: 2018-07-01

## 2018-07-01 MED ORDER — AMLODIPINE BESYLATE 5 MG PO TABS
2.50 | ORAL_TABLET | ORAL | Status: DC
Start: 2018-07-02 — End: 2018-07-01

## 2018-07-01 MED ORDER — LOSARTAN POTASSIUM 25 MG PO TABS
100.00 | ORAL_TABLET | ORAL | Status: DC
Start: 2018-07-02 — End: 2018-07-01

## 2018-07-01 MED ORDER — ACETAMINOPHEN 325 MG PO TABS
650.00 | ORAL_TABLET | ORAL | Status: DC
Start: ? — End: 2018-07-01

## 2018-07-01 MED ORDER — CEFDINIR 300 MG PO CAPS
300.00 | ORAL_CAPSULE | ORAL | Status: DC
Start: 2018-07-01 — End: 2018-07-01

## 2018-07-01 MED ORDER — ALUM & MAG HYDROXIDE-SIMETH 400-400-40 MG/5ML PO SUSP
30.00 | ORAL | Status: DC
Start: ? — End: 2018-07-01

## 2018-07-01 MED ORDER — METOPROLOL SUCCINATE ER 25 MG PO TB24
25.00 | ORAL_TABLET | ORAL | Status: DC
Start: 2018-07-01 — End: 2018-07-01

## 2018-07-01 MED ORDER — AZITHROMYCIN 250 MG PO TABS
250.00 | ORAL_TABLET | ORAL | Status: DC
Start: 2018-07-02 — End: 2018-07-01

## 2018-07-01 MED ORDER — GUAIFENESIN 100 MG/5ML PO SYRP
200.00 | ORAL_SOLUTION | ORAL | Status: DC
Start: ? — End: 2018-07-01

## 2018-07-01 MED ORDER — POLYETHYLENE GLYCOL 3350 17 G PO PACK
17.00 | PACK | ORAL | Status: DC
Start: 2018-07-02 — End: 2018-07-01

## 2018-07-01 NOTE — Telephone Encounter (Signed)
Copied from [place] [phone]. Topic: Inquiry >> Jul 01, 2018  2:40 PM Conception Chancy, NT wrote: Reason for CRM: Amy is calling from [facility] to inform the office that the patient is being discharged today. Can be reached at [phone] and will fax the discharge summary.

## 2018-07-01 NOTE — Telephone Encounter (Signed)
Patient telephone mail box full could not leave message.

## 2018-07-02 NOTE — Telephone Encounter (Signed)
Transition Care Management Follow-up Telephone Call  How have you been since you were released from the hospital? Doing a lot better but feeling very weak , DX with Pneumonia at DC with UTI. Patient DX with Pneumonia at discharge and UTI , hyponatremia  And Hypokalemia at admission.   Do you understand why you were in the hospital? yes   Do you understand the discharge instrcutions? yes  Items Reviewed:  Medications reviewed: yes  Allergies reviewed: yes  Dietary changes reviewed: yes  Referrals reviewed: yes   Functional Questionnaire:   Activities of Daily Living (ADLs):   She states they are independent in the following: ambulation, bathing and hygiene, feeding, continence, grooming, toileting and dressing States they require assistance with the following: No assistance needed.   Any transportation issues/concerns?: no   Any patient concerns? no   Confirmed importance and date/time of follow-up visits scheduled: yes   Confirmed with patient if condition begins to worsen call PCP or go to the ER.  Patient was given the Call-a-Nurse line [phone]: yes

## 2018-07-03 ENCOUNTER — Encounter: Payer: Medicare Other | Admitting: Nurse Practitioner

## 2018-07-09 ENCOUNTER — Ambulatory Visit: Payer: Medicare Other

## 2018-07-09 ENCOUNTER — Inpatient Hospital Stay: Payer: Medicare Other | Admitting: Internal Medicine

## 2018-07-09 ENCOUNTER — Encounter: Payer: Self-pay | Admitting: Internal Medicine

## 2018-07-09 ENCOUNTER — Ambulatory Visit (INDEPENDENT_AMBULATORY_CARE_PROVIDER_SITE_OTHER): Payer: Medicare Other | Admitting: Internal Medicine

## 2018-07-09 VITALS — BP 138/78 | HR 70 | Temp 98.2°F | Resp 18 | Wt 183.4 lb

## 2018-07-09 DIAGNOSIS — E7800 Pure hypercholesterolemia, unspecified: Secondary | ICD-10-CM | POA: Diagnosis not present

## 2018-07-09 DIAGNOSIS — F329 Major depressive disorder, single episode, unspecified: Secondary | ICD-10-CM

## 2018-07-09 DIAGNOSIS — R300 Dysuria: Secondary | ICD-10-CM | POA: Diagnosis not present

## 2018-07-09 DIAGNOSIS — J189 Pneumonia, unspecified organism: Secondary | ICD-10-CM | POA: Insufficient documentation

## 2018-07-09 DIAGNOSIS — J3489 Other specified disorders of nose and nasal sinuses: Secondary | ICD-10-CM

## 2018-07-09 DIAGNOSIS — G20 Parkinson's disease: Secondary | ICD-10-CM

## 2018-07-09 DIAGNOSIS — E871 Hypo-osmolality and hyponatremia: Secondary | ICD-10-CM | POA: Diagnosis not present

## 2018-07-09 DIAGNOSIS — I10 Essential (primary) hypertension: Secondary | ICD-10-CM

## 2018-07-09 DIAGNOSIS — D649 Anemia, unspecified: Secondary | ICD-10-CM

## 2018-07-09 DIAGNOSIS — F32A Depression, unspecified: Secondary | ICD-10-CM

## 2018-07-09 LAB — BASIC METABOLIC PANEL
BUN: 15 mg/dL (ref 6–23)
CALCIUM: 9.7 mg/dL (ref 8.4–10.5)
CHLORIDE: 99 meq/L (ref 96–112)
CO2: 30 meq/L (ref 19–32)
Creatinine, Ser: 0.96 mg/dL (ref 0.40–1.20)
GFR: 59.33 mL/min — ABNORMAL LOW (ref >60.00)
Glucose, Bld: 89 mg/dL (ref 70–99)
Potassium: 5 mEq/L (ref 3.5–5.1)
SODIUM: 135 meq/L (ref 135–145)

## 2018-07-09 LAB — URINALYSIS, ROUTINE W REFLEX MICROSCOPIC
Bilirubin Urine: NEGATIVE
HGB URINE DIPSTICK: NEGATIVE
Ketones, ur: NEGATIVE
LEUKOCYTES UA: NEGATIVE
Nitrite: NEGATIVE
RBC / HPF: NONE SEEN (ref 0–?)
SPECIFIC GRAVITY, URINE: 1.01 (ref 1.000–1.030)
URINE GLUCOSE: NEGATIVE
Urobilinogen, UA: 0.2 (ref 0.0–1.0)
pH: 8 (ref 5.0–8.0)

## 2018-07-09 LAB — CBC WITH DIFFERENTIAL/PLATELET
BASOS PCT: 1.2 % (ref 0.0–3.0)
Basophils Absolute: 0.1 10*3/uL (ref 0.0–0.1)
EOS ABS: 0.2 10*3/uL (ref 0.0–0.7)
EOS PCT: 3.7 % (ref 0.0–5.0)
HCT: 34.9 % — ABNORMAL LOW (ref 36.0–46.0)
HEMOGLOBIN: 11.8 g/dL — AB (ref 12.0–15.0)
Lymphocytes Relative: 25.9 % (ref 12.0–46.0)
Lymphs Abs: 1.5 10*3/uL (ref 0.7–4.0)
MCHC: 33.7 g/dL (ref 30.0–36.0)
MCV: 92.6 fl (ref 78.0–100.0)
MONO ABS: 0.4 10*3/uL (ref 0.1–1.0)
Monocytes Relative: 7.5 % (ref 3.0–12.0)
NEUTROS ABS: 3.5 10*3/uL (ref 1.4–7.7)
Neutrophils Relative %: 61.7 % (ref 43.0–77.0)
PLATELETS: 432 10*3/uL — AB (ref 150.0–400.0)
RBC: 3.77 Mil/uL — ABNORMAL LOW (ref 3.87–5.11)
RDW: 14 % (ref 11.5–15.5)
WBC: 5.7 10*3/uL (ref 4.0–10.5)

## 2018-07-09 LAB — IBC PANEL
IRON: 101 ug/dL (ref 42–145)
SATURATION RATIOS: 30.3 % (ref 20.0–50.0)
TRANSFERRIN: 238 mg/dL (ref 212.0–360.0)

## 2018-07-09 LAB — HEPATIC FUNCTION PANEL
ALBUMIN: 4 g/dL (ref 3.5–5.2)
ALT: 25 U/L (ref 0–35)
AST: 17 U/L (ref 0–37)
Alkaline Phosphatase: 54 U/L (ref 39–117)
BILIRUBIN TOTAL: 0.4 mg/dL (ref 0.2–1.2)
Bilirubin, Direct: 0.1 mg/dL (ref 0.0–0.3)
Total Protein: 7.2 g/dL (ref 6.0–8.3)

## 2018-07-09 LAB — FERRITIN: FERRITIN: 102.1 ng/mL (ref 10.0–291.0)

## 2018-07-09 LAB — LIPID PANEL
CHOLESTEROL: 157 mg/dL (ref 0–200)
HDL: 35.1 mg/dL — ABNORMAL LOW (ref >39.00)
NonHDL: 121.4
TRIGLYCERIDES: 236 mg/dL — AB (ref 0.0–149.0)
Total CHOL/HDL Ratio: 4
VLDL: 47.2 mg/dL — ABNORMAL HIGH (ref 0.0–40.0)

## 2018-07-09 LAB — LDL CHOLESTEROL, DIRECT: LDL DIRECT: 73 mg/dL

## 2018-07-09 MED ORDER — ACYCLOVIR 5 % EX OINT: 1.0000 "application " | TOPICAL_OINTMENT | Freq: Four times a day (QID) | CUTANEOUS | 0 refills | Status: DC

## 2018-07-09 NOTE — Assessment & Plan Note (Signed)
Colonoscopy 09/13/16 - diverticulosis and internal hemorrhoids.  Follow cbc.

## 2018-07-09 NOTE — Assessment & Plan Note (Signed)
Found to be hyponatremic - recent hospitalization.  Felt to be dehydrated. Recheck sodium today.

## 2018-07-09 NOTE — Assessment & Plan Note (Signed)
Recently on abx.  Check urine to confirm no infection.

## 2018-07-09 NOTE — Assessment & Plan Note (Signed)
Blood pressure under good control.  Continue same medication regimen.  Follow pressures.  Follow metabolic panel.   

## 2018-07-09 NOTE — Assessment & Plan Note (Signed)
On citalopram.  Stable.   

## 2018-07-09 NOTE — Assessment & Plan Note (Signed)
Recently admitted for pneumonia.  Treated.  Will need cxr in 4-6 weeks.

## 2018-07-09 NOTE — Assessment & Plan Note (Signed)
On pravastatin.  Low cholesterol diet and exercise.  Follow lipid panel and liver function tests.   

## 2018-07-09 NOTE — Assessment & Plan Note (Signed)
Followed by neurology.  Stable  

## 2018-07-09 NOTE — Progress Notes (Signed)
Patient ID: Wanda Martinez, female   DOB: Apr 18, 1935, 83 y.o.   MRN: [id number]   Subjective:    Patient ID: Wanda Martinez, female    DOB: Feb 09, 1935, 83 y.o.   MRN: [id number]  HPI  Patient here for hospital follow up.  She was admitted 06/29/18 after being found on the floor of the bathrrom.  Was diagnosed with pneumonia.  Also found to be hyponatremic.  cxr revealed retrocardiac opacity.  Respiratory viral panel - positive for rhinovirus/enterovirus.  Sputum culture grew strep pneumonia.  Treated with omnicef.  Completed 07/04/18.  Sodium on admission 129.  Discharge sodium 134.  hctz stopped.  Felt to be related to dehydration.  Since her discharge, she has improved.  Energy improving.  No increased cough or congestion.  Breathing stable.  No chest pain.  Eating.  No nausea or vomiting.  No abdominal pain.  Bowels moving.  Since being off abx, she states she feels some minimal dysuria.  No vaginal irritation or discharge.  Does report some persistent nasal irritation - fever blisters.  Was going to therapy prior to her hospitalization.  Plans to return.  Needs to get a little stronger.  Call with update next week.     Past Medical History:  Diagnosis Date  . Anemia   . Arm pain 07/26/2015  . Arthritis   . Arthritis, degenerative 03/26/2014  . Back pain 11/01/2013  . Breast cancer ([place])    Masectomy - left - 1986   . Breast cancer [facility])    Mastectomy-right -2014  . CHEST PAIN 04/29/2010   Qualifier: Diagnosis of  By: Wynetta Emery RN, Doroteo [place]    . Chronic cystitis   . Cystocele 02/22/2013  . Cystocele, midline 08/19/2013  . Degeneration of intervertebral disc of lumbosacral region 03/26/2014  . DYSPNEA 04/29/2010   Qualifier: Diagnosis of  By: Wynetta Emery RN, Doroteo [place]    . Enthesopathy of hip 03/26/2014  . GERD (gastroesophageal reflux disease)   . Hiatal hernia   . HTN (hypertension)   . LBP (low back pain) 03/26/2014  . Neck pain 11/01/2013  . Parkinson disease ([place])   . Sinusitis 02/07/2015  .  Skin lesions 07/12/2014  . Urinary incontinence    mixed    Past Surgical History:  Procedure Laterality Date  . ABDOMINAL HYSTERECTOMY    . BREAST BIOPSY  2013  . BREAST ENHANCEMENT SURGERY  1987  . BREAST IMPLANT REMOVAL    . BREAST IMPLANT REMOVAL Right 08/29/2012  . BREAST SURGERY  1986   s/p left mastectomy  . COLONOSCOPY WITH PROPOFOL N/A 09/13/2016   Procedure: COLONOSCOPY WITH PROPOFOL;  Surgeon: Manya Silvas, MD;  Location: [facility];  Service: Endoscopy;  Laterality: N/A;  . MASTECTOMY  08/2012   right  . TONSILLECTOMY AND ADENOIDECTOMY  79  . VESICOVAGINAL FISTULA CLOSURE W/ TAH  1983   Family History  Problem Relation Age of Onset  . Diabetes Father   . Stroke Father   . Colon polyps Father   . Stroke Mother   . Parkinson's disease Mother    Social History   Socioeconomic History  . Marital status: Married    Spouse name: Not on file  . Number of children: 3  . Years of education: Not on file  . Highest education level: Not on file  Occupational History  . Not on file  Social Needs  . Financial resource strain: Not on file  . Food insecurity:    Worry: Not on file  Inability: Not on file  . Transportation needs:    Medical: Not on file    Non-medical: Not on file  Tobacco Use  . Smoking status: Never Smoker  . Smokeless tobacco: Never Used  . Tobacco comment: tobacco use - no  Substance and Sexual Activity  . Alcohol use: No    Alcohol/week: 0.0 oz  . Drug use: No  . Sexual activity: Never  Lifestyle  . Physical activity:    Days per week: Not on file    Minutes per session: Not on file  . Stress: Not on file  Relationships  . Social connections:    Talks on phone: Not on file    Gets together: Not on file    Attends religious service: Not on file    Active member of club or organization: Not on file    Attends meetings of clubs or organizations: Not on file    Relationship status: Not on file  Other Topics Concern  . Not on file   Social History Narrative  . Not on file    Outpatient Encounter Medications as of 07/09/2018  Medication Sig  . acyclovir ointment (ZOVIRAX) 5 % Apply 1 application topically 4 (four) times daily.  Marland Kitchen amLODipine (NORVASC) 2.5 MG tablet TAKE 1 TABLET BY MOUTH ONCE DAILY.  Marland Kitchen aspirin 81 MG EC tablet Take 81 mg by mouth daily as needed.    . Calcium Carbonate (CALCIUM 600) 1500 MG TABS Take 1 tablet by mouth daily.    . citalopram (CELEXA) 20 MG tablet TAKE 1 & 1/2 TABLETS BY MOUTH ONCE DAILY  . cyclobenzaprine (FLEXERIL) 10 MG tablet Take 1 tablet (10 mg total) by mouth at bedtime.  . gabapentin (NEURONTIN) 100 MG capsule Take 200 mg by mouth 4 (four) times daily.   Marland Kitchen GLUCOSAMINE SULFATE PO Take by mouth daily.   . hydrochlorothiazide (HYDRODIURIL) 25 MG tablet TAKE 1 TABLET BY MOUTH ONCE DAILY. (PATIENT TAKES 1/2 ONCE DAILY AND EXTRA 1/2 IF NEEDED)  . HYDROcodone-acetaminophen (NORCO/VICODIN) 5-325 MG tablet Take 1 tablet by mouth every 8 (eight) hours as needed for severe pain.  Marland Kitchen losartan (COZAAR) 100 MG tablet TAKE 1 TABLET BY MOUTH ONCE DAILY.  Marland Kitchen lubiprostone (AMITIZA) 24 MCG capsule TAKE 1 CAPSULE BY MOUTH ONCE DAILY WITH BREAKFAST.  . Magnesium Oxide 400 (240 Mg) MG TABS   . meloxicam (MOBIC) 7.5 MG tablet Take 1 tablet (7.5 mg total) by mouth daily.  . metoprolol succinate (TOPROL-XL) 25 MG 24 hr tablet TAKE 1 TABLET BY MOUTH TWICE DAILY  . Multiple Vitamin (MULTIVITAMIN) tablet Take 1 tablet by mouth daily.  . Omega-3 1000 MG CAPS Take by mouth daily.   Marland Kitchen omeprazole (PRILOSEC) 20 MG capsule TAKE 1 CAPSULE BY MOUTH TWICE DAILY FOR REFLUX.  . pantoprazole (PROTONIX) 40 MG tablet TAKE 1 TABLET BY MOUTH ONCE DAILY.  . pravastatin (PRAVACHOL) 20 MG tablet TAKE 1 TABLET BY MOUTH ONCE DAILY.  . [DISCONTINUED] gabapentin (NEURONTIN) 300 MG capsule Take 1 capsule (300 mg total) by mouth 2 (two) times daily. (Patient not taking: Reported on 06/04/2018)  . [DISCONTINUED] magnesium oxide  (MAGNESIUM-OXIDE) 400 (241.3 Mg) MG tablet Take 1 tablet (400 mg total) by mouth daily.   No facility-administered encounter medications on file as of 07/09/2018.     Review of Systems  Constitutional: Negative for appetite change and unexpected weight change.  HENT: Negative for congestion and sinus pressure.   Respiratory: Negative for cough and chest tightness.  Breathing stable.    Cardiovascular: Negative for chest pain, palpitations and leg swelling.  Gastrointestinal: Negative for diarrhea, nausea and vomiting.  Genitourinary: Positive for dysuria. Negative for difficulty urinating and vaginal discharge.  Musculoskeletal: Negative for joint swelling and myalgias.  Skin: Negative for color change and rash.  Neurological: Negative for dizziness, light-headedness and headaches.  Psychiatric/Behavioral: Negative for agitation and dysphoric mood.       Objective:    Physical Exam  Constitutional: She appears well-developed and well-nourished. No distress.  HENT:  Mouth/Throat: Oropharynx is clear and moist.  Nares - lesions - intranasal.    Neck: Neck supple. No thyromegaly present.  Cardiovascular: Normal rate and regular rhythm.  Pulmonary/Chest: Breath sounds normal. No respiratory distress. She has no wheezes.  Abdominal: Soft. Bowel sounds are normal. There is no tenderness.  Musculoskeletal: She exhibits no edema or tenderness.  Lymphadenopathy:    She has no cervical adenopathy.  Skin: No rash noted. No erythema.  Psychiatric: She has a normal mood and affect. Her behavior is normal.    BP 138/78 (BP Location: Left Arm, Patient Position: Sitting, Cuff Size: Large)   Pulse 70   Temp 98.2 F (36.8 C) (Oral)   Resp 18   Wt 183 lb 6.4 oz (83.2 kg)   LMP 12/18/1981   SpO2 98%   BMI 31.48 kg/m  Wt Readings from Last 3 Encounters:  07/09/18 183 lb 6.4 oz (83.2 kg)  06/04/18 180 lb (81.6 kg)  05/27/18 180 lb (81.6 kg)     Lab Results  Component Value Date    WBC 5.7 07/09/2018   HGB 11.8 (L) 07/09/2018   HCT 34.9 (L) 07/09/2018   PLT 432.0 (H) 07/09/2018   GLUCOSE 89 07/09/2018   CHOL 157 07/09/2018   TRIG 236.0 (H) 07/09/2018   HDL 35.10 (L) 07/09/2018   LDLDIRECT 73.0 07/09/2018   LDLCALC 99 03/04/2018   ALT 25 07/09/2018   AST 17 07/09/2018   NA 135 07/09/2018   K 5.0 07/09/2018   CL 99 07/09/2018   CREATININE 0.96 07/09/2018   BUN 15 07/09/2018   CO2 30 07/09/2018   TSH 2.12 03/04/2018    Dg C-arm 1-60 Min-no Report  Result Date: 06/04/2018 Fluoroscopy was utilized by the requesting physician.  No radiographic interpretation.       Assessment & Plan:   Problem List Items Addressed This Visit    Anemia    Colonoscopy 09/13/16 - diverticulosis and internal hemorrhoids.  Follow cbc.        Relevant Orders   CBC with Differential/Platelet (Completed)   Ferritin (Completed)   IBC panel (Completed)   Clinical depression    On citalopram.  Stable.        Dysuria    Recently on abx.  Check urine to confirm no infection.        Relevant Orders   Urinalysis, Routine w reflex microscopic (Completed)   Urine Culture   Essential hypertension    Blood pressure under good control.  Continue same medication regimen.  Follow pressures.  Follow metabolic panel.        Relevant Orders   Basic metabolic panel (Completed)   Hypercholesterolemia - Primary    On pravastatin.  Low cholesterol diet and exercise.  Follow lipid panel and liver function tests.        Relevant Orders   Lipid panel (Completed)   Hepatic function panel (Completed)   Hyponatremia    Found to be hyponatremic - recent hospitalization.  Felt to be dehydrated. Recheck sodium today.       Parkinsons ([place])    Followed by neurology. Stable.        Pneumonia    Recently admitted for pneumonia.  Treated.  Will need cxr in 4-6 weeks.         Other Visit Diagnoses    Nasal lesion       nasal lesion.  zovirax ointment.         Einar Pheasant,  MD

## 2018-07-10 ENCOUNTER — Ambulatory Visit
Admission: RE | Admit: 2018-07-10 | Discharge: 2018-07-10 | Disposition: A | Discharge status: Home / Self Care | Payer: Medicare Other | Source: Ambulatory Visit | Attending: Oncology | Admitting: Oncology

## 2018-07-10 DIAGNOSIS — M85832 Other specified disorders of bone density and structure, left forearm: Secondary | ICD-10-CM | POA: Diagnosis not present

## 2018-07-10 DIAGNOSIS — M810 Age-related osteoporosis without current pathological fracture: Secondary | ICD-10-CM | POA: Diagnosis not present

## 2018-07-10 DIAGNOSIS — M8580 Other specified disorders of bone density and structure, unspecified site: Secondary | ICD-10-CM | POA: Diagnosis present

## 2018-07-10 DIAGNOSIS — M85852 Other specified disorders of bone density and structure, left thigh: Secondary | ICD-10-CM | POA: Insufficient documentation

## 2018-07-10 DIAGNOSIS — C50211 Malignant neoplasm of upper-inner quadrant of right female breast: Secondary | ICD-10-CM | POA: Diagnosis not present

## 2018-07-10 DIAGNOSIS — Z780 Asymptomatic menopausal state: Secondary | ICD-10-CM | POA: Diagnosis not present

## 2018-07-10 LAB — URINE CULTURE
MICRO NUMBER: 90869967
SPECIMEN QUALITY:: ADEQUATE

## 2018-07-12 ENCOUNTER — Telehealth: Payer: Self-pay | Admitting: Internal Medicine

## 2018-07-12 NOTE — Telephone Encounter (Signed)
Request for an order to go back to PT with [facility]

## 2018-07-12 NOTE — Telephone Encounter (Signed)
Ok to place referral to physical therapy?

## 2018-07-12 NOTE — Telephone Encounter (Signed)
Copied from [place] [phone]. Topic: Quick Communication - See Telephone Encounter >> Jul 12, 2018 10:41 AM Bea Graff, NT wrote: CRM for notification. See Telephone encounter for: 07/12/18. Pt calling to let Dr. Nicki Reaper know that she is feeling a lot better, but that her energy level is still low. She states she needs her doctor to call [facility] in [place] to let them know she is up to coming back for PT. She states they will not let her come back until they here from her PCP.

## 2018-07-12 NOTE — Telephone Encounter (Signed)
She was already being treated by [facility].  Therapy was placed on hold when she was admitted.  She was to feeling better when I saw her, but was to call with update at end of week.  If she is feeling better and ready to go back, I am ok with this.  Please call and notify [facility] in [place] and see if they need anything in writing.  Will need to start slow.

## 2018-07-16 NOTE — Telephone Encounter (Signed)
Signed and placed in box.   

## 2018-07-16 NOTE — Telephone Encounter (Signed)
Faxed to [facility] in [place]

## 2018-07-16 NOTE — Telephone Encounter (Signed)
Spoke with PT and they need written letter stating patient can resume therapy. I have typed and placed in your quick sign for signature.

## 2018-07-17 ENCOUNTER — Ambulatory Visit: Payer: Medicare Other | Attending: Pain Medicine | Admitting: Pain Medicine

## 2018-07-17 ENCOUNTER — Other Ambulatory Visit: Payer: Self-pay

## 2018-07-17 ENCOUNTER — Encounter: Payer: Self-pay | Admitting: Pain Medicine

## 2018-07-17 VITALS — BP 158/96 | HR 72 | Temp 98.1°F | Resp 16 | Ht 64.0 in | Wt 180.0 lb

## 2018-07-17 DIAGNOSIS — E669 Obesity, unspecified: Secondary | ICD-10-CM | POA: Insufficient documentation

## 2018-07-17 DIAGNOSIS — G8929 Other chronic pain: Secondary | ICD-10-CM | POA: Diagnosis not present

## 2018-07-17 DIAGNOSIS — K5903 Drug induced constipation: Secondary | ICD-10-CM | POA: Insufficient documentation

## 2018-07-17 DIAGNOSIS — G629 Polyneuropathy, unspecified: Secondary | ICD-10-CM | POA: Insufficient documentation

## 2018-07-17 DIAGNOSIS — M4722 Other spondylosis with radiculopathy, cervical region: Secondary | ICD-10-CM | POA: Diagnosis not present

## 2018-07-17 DIAGNOSIS — N182 Chronic kidney disease, stage 2 (mild): Secondary | ICD-10-CM | POA: Insufficient documentation

## 2018-07-17 DIAGNOSIS — M7061 Trochanteric bursitis, right hip: Secondary | ICD-10-CM | POA: Insufficient documentation

## 2018-07-17 DIAGNOSIS — Z7982 Long term (current) use of aspirin: Secondary | ICD-10-CM | POA: Insufficient documentation

## 2018-07-17 DIAGNOSIS — G8918 Other acute postprocedural pain: Secondary | ICD-10-CM | POA: Insufficient documentation

## 2018-07-17 DIAGNOSIS — M545 Low back pain, unspecified: Secondary | ICD-10-CM

## 2018-07-17 DIAGNOSIS — M899 Disorder of bone, unspecified: Secondary | ICD-10-CM | POA: Insufficient documentation

## 2018-07-17 DIAGNOSIS — Z79891 Long term (current) use of opiate analgesic: Secondary | ICD-10-CM | POA: Diagnosis not present

## 2018-07-17 DIAGNOSIS — M542 Cervicalgia: Secondary | ICD-10-CM | POA: Insufficient documentation

## 2018-07-17 DIAGNOSIS — Z8489 Family history of other specified conditions: Secondary | ICD-10-CM | POA: Insufficient documentation

## 2018-07-17 DIAGNOSIS — Z789 Other specified health status: Secondary | ICD-10-CM | POA: Insufficient documentation

## 2018-07-17 DIAGNOSIS — R32 Unspecified urinary incontinence: Secondary | ICD-10-CM | POA: Insufficient documentation

## 2018-07-17 DIAGNOSIS — G894 Chronic pain syndrome: Secondary | ICD-10-CM | POA: Diagnosis not present

## 2018-07-17 DIAGNOSIS — Z791 Long term (current) use of non-steroidal anti-inflammatories (NSAID): Secondary | ICD-10-CM | POA: Insufficient documentation

## 2018-07-17 DIAGNOSIS — M25551 Pain in right hip: Secondary | ICD-10-CM | POA: Diagnosis not present

## 2018-07-17 DIAGNOSIS — M79604 Pain in right leg: Secondary | ICD-10-CM | POA: Insufficient documentation

## 2018-07-17 DIAGNOSIS — M47896 Other spondylosis, lumbar region: Secondary | ICD-10-CM | POA: Insufficient documentation

## 2018-07-17 DIAGNOSIS — M533 Sacrococcygeal disorders, not elsewhere classified: Secondary | ICD-10-CM | POA: Insufficient documentation

## 2018-07-17 DIAGNOSIS — Z833 Family history of diabetes mellitus: Secondary | ICD-10-CM | POA: Insufficient documentation

## 2018-07-17 DIAGNOSIS — M25552 Pain in left hip: Secondary | ICD-10-CM | POA: Diagnosis not present

## 2018-07-17 DIAGNOSIS — M7918 Myalgia, other site: Secondary | ICD-10-CM | POA: Diagnosis not present

## 2018-07-17 DIAGNOSIS — Z823 Family history of stroke: Secondary | ICD-10-CM | POA: Insufficient documentation

## 2018-07-17 DIAGNOSIS — M25511 Pain in right shoulder: Secondary | ICD-10-CM | POA: Diagnosis not present

## 2018-07-17 DIAGNOSIS — G20 Parkinson's disease: Secondary | ICD-10-CM | POA: Insufficient documentation

## 2018-07-17 DIAGNOSIS — M7062 Trochanteric bursitis, left hip: Secondary | ICD-10-CM | POA: Insufficient documentation

## 2018-07-17 DIAGNOSIS — M961 Postlaminectomy syndrome, not elsewhere classified: Secondary | ICD-10-CM | POA: Insufficient documentation

## 2018-07-17 DIAGNOSIS — I129 Hypertensive chronic kidney disease with stage 1 through stage 4 chronic kidney disease, or unspecified chronic kidney disease: Secondary | ICD-10-CM | POA: Insufficient documentation

## 2018-07-17 DIAGNOSIS — Z6830 Body mass index (BMI) 30.0-30.9, adult: Secondary | ICD-10-CM | POA: Insufficient documentation

## 2018-07-17 DIAGNOSIS — Z5181 Encounter for therapeutic drug level monitoring: Secondary | ICD-10-CM | POA: Diagnosis not present

## 2018-07-17 DIAGNOSIS — M48062 Spinal stenosis, lumbar region with neurogenic claudication: Secondary | ICD-10-CM | POA: Insufficient documentation

## 2018-07-17 DIAGNOSIS — M5137 Other intervertebral disc degeneration, lumbosacral region: Secondary | ICD-10-CM | POA: Insufficient documentation

## 2018-07-17 DIAGNOSIS — Z79899 Other long term (current) drug therapy: Secondary | ICD-10-CM | POA: Diagnosis not present

## 2018-07-17 DIAGNOSIS — E7800 Pure hypercholesterolemia, unspecified: Secondary | ICD-10-CM | POA: Diagnosis not present

## 2018-07-17 DIAGNOSIS — M79601 Pain in right arm: Secondary | ICD-10-CM | POA: Insufficient documentation

## 2018-07-17 DIAGNOSIS — K219 Gastro-esophageal reflux disease without esophagitis: Secondary | ICD-10-CM | POA: Insufficient documentation

## 2018-07-17 MED ORDER — HYDROCODONE-ACETAMINOPHEN 5-325 MG PO TABS: 1.0000 | ORAL_TABLET | Freq: Three times a day (TID) | ORAL | 0 refills | Status: DC | PRN

## 2018-07-17 NOTE — Progress Notes (Signed)
Nursing Pain Medication Assessment:  Safety precautions to be maintained throughout the outpatient stay will include: orient to surroundings, keep bed in low position, maintain call bell within reach at all times, provide assistance with transfer out of bed and ambulation.  Medication Inspection Compliance: Pill count conducted under aseptic conditions, in front of the patient. Neither the pills nor the bottle was removed from the patient's sight at any time. Once count was completed pills were immediately returned to the patient in their original bottle.  Medication: Hydrocodone/APAP Pill/Patch Count: 65 of 90 pills remain Pill/Patch Appearance: Markings consistent with prescribed medication Bottle Appearance: Standard pharmacy container. Clearly labeled. Filled Date: 07 / 11 / 2019 Last Medication intake:  Today

## 2018-07-17 NOTE — Patient Instructions (Addendum)
You were given 3 prescriptions for Hydrocodone today. You were given information about spinal cord stimulation.

## 2018-07-17 NOTE — Progress Notes (Signed)
Patient's Name: Wanda Martinez  MRN: [id number]  Referring Provider: Einar Pheasant, MD  DOB: 04/21/1935  PCP: Einar Pheasant, MD  DOS: 07/17/2018  Note by: Gaspar Cola, MD  Service setting: Ambulatory outpatient  Specialty: Interventional Pain Management  Location: [facility] (AMB) Pain Management Facility    Patient type: Established   Primary Reason(s) for Visit: Encounter for prescription drug management & post-procedure evaluation of chronic illness with mild to moderate exacerbation(Level of risk: moderate) CC: Back Pain (low ) and Hip Pain (right)  HPI  Wanda Martinez is a 83 y.o. year old, female patient, who comes today for a post-procedure evaluation and medication management. She has Hypercholesterolemia; Essential hypertension; Frequent UTI; Primary cancer of upper inner quadrant of right female breast ([place]); Anemia; Tremor; Toenail fungus; Headache; Obesity (BMI 30-39.9); Dysuria; Sinusitis; Parkinsons ([place]); Fatigue; Chronic kidney disease (CKD), stage II (mild); Clinical depression; Incomplete bladder emptying; Mixed incontinence; HLD (hyperlipidemia); Bladder infection, chronic; Parkinson's disease ([place]); Pure hypercholesterolemia; Hernia, rectovaginal; Long term current use of opiate analgesic; Long term prescription opiate use; Opiate use; Encounter for therapeutic drug level monitoring; Encounter for chronic pain management; Chronic low back pain (Primary Source of Pain) (Bilateral) (R>L); Lumbar spondylosis; Chronic hip pain (Bilateral); Chronic neck pain; Cervical spondylosis; Chronic cervical radicular pain (Right); Chronic upper extremity pain (Right); Osteoarthritis; Diffuse myofascial pain syndrome; Neurogenic pain; Chronic upper back pain (Right); Myofascial pain syndrome (Right) (cervicothoracic); Vaginitis; Lumbar facet syndrome (Bilateral) (R>L); Malignant neoplasm of right female breast ([place]); Chronic constipation; Cervical facet hypertrophy; Cervical facet syndrome ([place]);  Chronic shoulder pain (Right); Chronic pain syndrome; Chronic sacroiliac joint pain (Left); Chronic sacroiliac joint pain (Right); Lumbosacral foraminal stenosis (L5-S1) (Right); Lumbar spinal stenosis (with neurogenic claudication) (L3-4); Chronic lower extremity pain (Secondary Area of Pain) (Right); Chronic lumbar radicular pain (S1) (Right); Osteopenia; Acute postoperative pain; Trochanteric bursitis of hip (Bilateral); Spondylosis without myelopathy or radiculopathy, lumbosacral region; Trigger point with back pain (Right); DDD (degenerative disc disease), lumbosacral; Hyponatremia; Pneumonia; Fall; GERD (gastroesophageal reflux disease); Constipation due to opioid therapy; Pharmacologic therapy; Disorder of skeletal system; and Problems influencing health status on their problem list. Her primarily concern today is the Back Pain (low ) and Hip Pain (right)  Pain Assessment: Location: Lower Back Radiating: radiates into right hip  Onset: More than a month ago Duration: Chronic pain Quality: Aching, Discomfort Severity: 3 /10 (subjective, self-reported pain score)  Note: Reported level is compatible with observation.                         When using our objective Pain Scale, levels between 6 and 10/10 are said to belong in an emergency room, as it progressively worsens from a 6/10, described as severely limiting, requiring emergency care not usually available at an outpatient pain management facility. At a 6/10 level, communication becomes difficult and requires great effort. Assistance to reach the emergency department may be required. Facial flushing and profuse sweating along with potentially dangerous increases in heart rate and blood pressure will be evident. Effect on ADL: "I am able to do much more since my injection" Timing: Constant Modifying factors: injections, medications BP: (!) 158/96  HR: 72  Wanda Martinez was last seen on 06/04/2018 for a procedure. During today's appointment we  reviewed Wanda Martinez's post-procedure results, as well as her outpatient medication regimen.  Further details on both, my assessment(s), as well as the proposed treatment plan, please see below.  Controlled Substance Pharmacotherapy Assessment REMS (Risk Evaluation and  Mitigation Strategy)  Analgesic: Hydrocodone/APAP 5/325 one tablet every 8 hours MME/day: 15 mg/day.  Wanda Shorter, RN  07/17/2018 10:52 AM  Signed Nursing Pain Medication Assessment:  Safety precautions to be maintained throughout the outpatient stay will include: orient to surroundings, keep bed in low position, maintain call bell within reach at all times, provide assistance with transfer out of bed and ambulation.  Medication Inspection Compliance: Pill count conducted under aseptic conditions, in front of the patient. Neither the pills nor the bottle was removed from the patient's sight at any time. Once count was completed pills were immediately returned to the patient in their original bottle.  Medication: Hydrocodone/APAP Pill/Patch Count: 65 of 90 pills remain Pill/Patch Appearance: Markings consistent with prescribed medication Bottle Appearance: Standard pharmacy container. Clearly labeled. Filled Date: 07 / 11 / 2019 Last Medication intake:  Today   Pharmacokinetics: Liberation and absorption (onset of action): WNL Distribution (time to peak effect): WNL Metabolism and excretion (duration of action): WNL         Pharmacodynamics: Desired effects: Analgesia: Wanda Martinez reports >50% benefit. Functional ability: Patient reports that medication allows her to accomplish basic ADLs Clinically meaningful improvement in function (CMIF): Sustained CMIF goals met Perceived effectiveness: Described as relatively effective, allowing for increase in activities of daily living (ADL) Undesirable effects: Side-effects or Adverse reactions: None reported Monitoring: [place] PMP: Online review of the past 33-monthperiod conducted.  Compliant with practice rules and regulations Last UDS on record: Summary  Date Value Ref Range Status  07/02/2017 FINAL  Final    Comment:    ==================================================================== TOXASSURE SELECT 13 (MW) ==================================================================== Test                             Result       Flag       Units Drug Present and Declared for Prescription Verification   Hydrocodone                    597          EXPECTED   ng/mg creat   Dihydrocodeine                 245          EXPECTED   ng/mg creat   Norhydrocodone                 1486         EXPECTED   ng/mg creat    Sources of hydrocodone include scheduled prescription    medications. Dihydrocodeine and norhydrocodone are expected    metabolites of hydrocodone. Dihydrocodeine is also available as a    scheduled prescription medication. ==================================================================== Test                      Result    Flag   Units      Ref Range   Creatinine              119              mg/dL      >=20 ==================================================================== Declared Medications:  The flagging and interpretation on this report are based on the  following declared medications.  Unexpected results may arise from  inaccuracies in the declared medications.  **Note: The testing scope of this panel includes these medications:  Hydrocodone (Hydrocodone-Acetaminophen)  **Note: The testing scope of this panel does not  include following  reported medications:  Acetaminophen (Hydrocodone-Acetaminophen)  Amlodipine Besylate  Aspirin  Calcium Carbonate  Carbidopa (Carbidopa/Levodopa)  Citalopram  Cyclobenzaprine  Gabapentin  Glucosamine  Hydrochlorothiazide  Letrozole  Levodopa (Carbidopa/Levodopa)  Losartan (Losartan Potassium)  Lubiprostone  Magnesium Oxide  Meloxicam  Metoprolol  Multivitamin  Omega-3 Fatty Acids  Omeprazole   Pravastatin ==================================================================== For clinical consultation, please call [phone]. ====================================================================    UDS interpretation: Compliant          Medication Assessment Form: Reviewed. Patient indicates being compliant with therapy Treatment compliance: Compliant Risk Assessment Profile: Aberrant behavior: See prior evaluations. None observed or detected today Comorbid factors increasing risk of overdose: See prior notes. No additional risks detected today Risk of substance use disorder (SUD): Low Opioid Risk Tool - 06/04/18 1039      Family History of Substance Abuse   Alcohol  Negative    Illegal Drugs  Negative    Rx Drugs  Negative      Personal History of Substance Abuse   Alcohol  Negative    Illegal Drugs  Negative    Rx Drugs  Negative      Age   Age between 44-45 years   No      Psychological Disease   Psychological Disease  Negative    Depression  Negative      Total Score   Opioid Risk Tool Scoring  0    Opioid Risk Interpretation  Low Risk      ORT Scoring interpretation table:  Score <3 = Low Risk for SUD  Score between 4-7 = Moderate Risk for SUD  Score >8 = High Risk for Opioid Abuse   Risk Mitigation Strategies:  Patient Counseling: Covered Patient-Prescriber Agreement (PPA): Present and active  Notification to other healthcare providers: Done  Pharmacologic Plan: No change in therapy, at this time.             Post-Procedure Assessment  06/04/2018 Procedure: Therapeutic right-sided lumbar facet radiofrequency ablation #1 under fluoroscopic guidance and IV sedation Pre-procedure pain score:  8/10 Post-procedure pain score: 0/10 (100% relief) Influential Factors: BMI: 30.90 kg/m Intra-procedural challenges: None observed.         Assessment challenges: None detected.              Reported side-effects: None.        Post-procedural adverse  reactions or complications: None reported         Sedation: Sedation provided. When no sedatives are used, the analgesic levels obtained are directly associated to the effectiveness of the local anesthetics. However, when sedation is provided, the level of analgesia obtained during the initial 1 hour following the intervention, is believed to be the result of a combination of factors. These factors may include, but are not limited to: 1. The effectiveness of the local anesthetics used. 2. The effects of the analgesic(s) and/or anxiolytic(s) used. 3. The degree of discomfort experienced by the patient at the time of the procedure. 4. The patients ability and reliability in recalling and recording the events. 5. The presence and influence of possible secondary gains and/or psychosocial factors. Reported result: Relief experienced during the 1st hour after the procedure: 100 % (Ultra-Short Term Relief)            Interpretative annotation: Clinically appropriate result. Analgesia during this period is likely to be Local Anesthetic and/or IV Sedative (Analgesic/Anxiolytic) related.          Effects of local anesthetic: The analgesic effects attained during  this period are directly associated to the localized infiltration of local anesthetics and therefore cary significant diagnostic value as to the etiological location, or anatomical origin, of the pain. Expected duration of relief is directly dependent on the pharmacodynamics of the local anesthetic used. Long-acting (4-6 hours) anesthetics used.  Reported result: Relief during the next 4 to 6 hour after the procedure: 90 % (Short-Term Relief)            Interpretative annotation: Clinically appropriate result. Analgesia during this period is likely to be Local Anesthetic-related.          Long-term benefit: Defined as the period of time past the expected duration of local anesthetics (1 hour for short-acting and 4-6 hours for long-acting). With the  possible exception of prolonged sympathetic blockade from the local anesthetics, benefits during this period are typically attributed to, or associated with, other factors such as analgesic sensory neuropraxia, antiinflammatory effects, or beneficial biochemical changes provided by agents other than the local anesthetics.  Reported result: Extended relief following procedure: 75 % (Long-Term Relief)            Interpretative annotation: Clinically appropriate result. Good relief. No permanent benefit expected. Inflammation plays a part in the etiology to the pain. Benefit could signal adequate RF ablation.  Current benefits: Defined as reported results that persistent at this point in time.   Analgesia: 75-100 % Wanda Martinez reports improvement of axial symptoms. Function: Wanda Martinez reports improvement in function ROM: Wanda Martinez reports improvement in ROM Interpretative annotation: Ongoing benefit. Therapeutic success. Effective therapeutic approach. Benefit could signal adequate RF ablation.  Interpretation: Results would suggest a successful therapeutic intervention.                  Plan:  Set up procedure as a PRN palliative treatment option for this patient.                Laboratory Chemistry  Inflammation Markers (CRP: Acute Phase) (ESR: Chronic Phase) No results found for: CRP, ESRSEDRATE, LATICACIDVEN                       Rheumatology Markers No results found for: RF, ANA, LABURIC, URICUR, LYMEIGGIGMAB, LYMEABIGMQN, HLAB27                      Renal Function Markers Lab Results  Component Value Date   BUN 15 07/09/2018   CREATININE 0.96 07/09/2018   GFRAA >60 10/16/2017   GFRNONAA 58 (L) 10/16/2017                             Hepatic Function Markers Lab Results  Component Value Date   AST 17 07/09/2018   ALT 25 07/09/2018   ALBUMIN 4.0 07/09/2018   ALKPHOS 54 07/09/2018                        Electrolytes Lab Results  Component Value Date   NA 135 07/09/2018   K  5.0 07/09/2018   CL 99 07/09/2018   CALCIUM 9.7 07/09/2018   MG 1.8 04/21/2013                        Neuropathy Markers Lab Results  Component Value Date   VITAMINB12 881 02/01/2016  Bone Pathology Markers No results found for: VD25OH, H139778, WE3154MG8, QP6195KD3, 25OHVITD1, 25OHVITD2, 25OHVITD3, TESTOFREE, TESTOSTERONE                       Coagulation Parameters Lab Results  Component Value Date   PLT 432.0 (H) 07/09/2018                        Cardiovascular Markers Lab Results  Component Value Date   TROPONINI < 0.02 10/09/2014   HGB 11.8 (L) 07/09/2018   HCT 34.9 (L) 07/09/2018                         CA Markers Lab Results  Component Value Date   LABCA2 24.5 08/07/2013                        Note: Lab results reviewed.  Recent Diagnostic Imaging Results  DG Bone Density EXAM: DUAL X-RAY ABSORPTIOMETRY (DXA) FOR BONE MINERAL DENSITY  IMPRESSION: Dear Dr. Grayland Ormond, Your patient Wanda Martinez completed a BMD test on 07/10/2018 using the [place] (analysis version: 14.10) manufactured by [employer]. The following summarizes the results of our evaluation. PATIENT BIOGRAPHICAL: Name: Wanda Martinez, Wanda Martinez Patient ID: [id number] Birth Date: January 20, 1935 Height: 64.0 in. Gender: Female Exam Date: 07/10/2018 Weight: 184.5 lbs. Indications: Osteoarthritis, Hysterectomy, Advanced Age, Breast CA, Height Loss, Postmenopausal, Caucasian Fractures: Treatments: Calcium, Vitamin D  ASSESSMENT: The BMD measured at Forearm Radius 33% is 0.647 g/cm2 with a T-score of -2.6. This patient is considered osteoporotic according to [facility]) criteria. Lumbar spine was not utilized due to (advanced degenerative changes/scoliosis, etc.) Site Region Measured Measured WHO Young Adult BMD Date       Age      Classification T-score DualFemur Neck Left 07/10/2018 80.7 Osteopenia -2.4 0.699 g/cm2 DualFemur Neck Left 06/25/2017  79.6 Osteopenia -2.3 0.718 g/cm2 DualFemur Neck Left 06/21/2016 78.6 Osteopenia -2.3 0.712 g/cm2 DualFemur Neck Left 06/15/2015 77.6 Osteopenia -1.8 0.788 g/cm2 DualFemur Neck Left 08/21/2013 75.8 Osteopenia -2.0 0.764 g/cm2  DualFemur Total Mean 07/10/2018 80.7 Osteopenia -2.1 0.743 g/cm2 DualFemur Total Mean 06/25/2017 79.6 Osteopenia -2.2 0.734 g/cm2 DualFemur Total Mean 06/21/2016 78.6 Osteopenia -2.1 0.742 g/cm2 DualFemur Total Mean 06/15/2015 77.6 Osteopenia -2.0 0.756 g/cm2 DualFemur Total Mean 08/21/2013 75.8 Osteopenia -1.7 0.789 g/cm2  Left Forearm Radius 33% 07/10/2018 80.7 Osteoporosis -2.6 0.647 g/cm2 Left Forearm Radius 33% 06/25/2017 79.6 Osteopenia -2.1 0.695 g/cm2 Left Forearm Radius 33% 06/21/2016 78.6 Osteopenia -1.9 0.712 g/cm2 Left Forearm Radius 33% 06/15/2015 77.6 Osteoporosis -2.7 0.638 g/cm2 Left Forearm Radius 33% 08/21/2013 75.8 Osteopenia -1.7 0.724 g/cm2  World Health Organization [facility]) criteria for post-menopausal, Caucasian Women: Normal:       T-score at or above -1 SD Osteopenia:   T-score between -1 and -2.5 SD Osteoporosis: T-score at or below -2.5 SD  RECOMMENDATIONS: 1. All patients should optimize calcium and vitamin D intake. 2. Consider FDA-approved medical therapies in postmenopausal women and men aged 109 years and older, based on the following: a. A hip or vertebral(clinical or morphometric) fracture b. T-score < -2.5 at the femoral neck or spine after appropriate evaluation to exclude secondary causes c. Low bone mass (T-score between -1.0 and -2.5 at the femoral neck or spine) and a 10-year probability of a hip fracture > 3% or a 10-year probability of a major osteoporosis-related fracture > 20% based on the [place]-adapted [facility]  algorithm d. Clinician judgment and/or patient preferences may indicate treatment for people with 10-year fracture probabilities above or below these levels FOLLOW-UP: People with diagnosed cases of osteoporosis or  at high risk for fracture should have regular bone mineral density tests. For patients eligible for Medicare, routine testing is allowed once every 2 years. The testing frequency can be increased to one year for patients who have rapidly progressing disease, those who are receiving or discontinuing medical therapy to restore bone mass, or have additional risk factors. I have reviewed this report, and agree with the above findings.  [facility]  Electronically Signed   By: Lowella Grip III M.D.   On: 07/10/2018 10:50  Complexity Note: I personally reviewed the fluoroscopic imaging of the procedure.                        Meds   Current Outpatient Medications:  .  acyclovir ointment (ZOVIRAX) 5 %, Apply 1 application topically 4 (four) times daily., Disp: 15 g, Rfl: 0 .  amLODipine (NORVASC) 2.5 MG tablet, TAKE 1 TABLET BY MOUTH ONCE DAILY., Disp: 30 tablet, Rfl: 2 .  aspirin 81 MG EC tablet, Take 81 mg by mouth daily as needed.  , Disp: , Rfl:  .  Calcium Carbonate (CALCIUM 600) 1500 MG TABS, Take 1 tablet by mouth daily.  , Disp: , Rfl:  .  citalopram (CELEXA) 20 MG tablet, TAKE 1 & 1/2 TABLETS BY MOUTH ONCE DAILY, Disp: , Rfl:  .  cyclobenzaprine (FLEXERIL) 10 MG tablet, Take 1 tablet (10 mg total) by mouth at bedtime., Disp: 90 tablet, Rfl: 0 .  gabapentin (NEURONTIN) 100 MG capsule, Take 200 mg by mouth 4 (four) times daily. , Disp: , Rfl: 1 .  GLUCOSAMINE SULFATE PO, Take by mouth daily. , Disp: , Rfl:  .  [START ON 09/15/2018] HYDROcodone-acetaminophen (NORCO/VICODIN) 5-325 MG tablet, Take 1 tablet by mouth every 8 (eight) hours as needed for severe pain., Disp: 90 tablet, Rfl: 0 .  [START ON 08/16/2018] HYDROcodone-acetaminophen (NORCO/VICODIN) 5-325 MG tablet, Take 1 tablet by mouth every 8 (eight) hours as needed for severe pain., Disp: 90 tablet, Rfl: 0 .  HYDROcodone-acetaminophen (NORCO/VICODIN) 5-325 MG tablet, Take 1 tablet by mouth every 8 (eight) hours as  needed for severe pain., Disp: 90 tablet, Rfl: 0 .  losartan (COZAAR) 100 MG tablet, TAKE 1 TABLET BY MOUTH ONCE DAILY., Disp: 30 tablet, Rfl: 2 .  lubiprostone (AMITIZA) 24 MCG capsule, TAKE 1 CAPSULE BY MOUTH ONCE DAILY WITH BREAKFAST., Disp: , Rfl:  .  Magnesium Oxide 400 (240 Mg) MG TABS, , Disp: , Rfl: 0 .  meloxicam (MOBIC) 7.5 MG tablet, Take 1 tablet (7.5 mg total) by mouth daily., Disp: 90 tablet, Rfl: 0 .  metoprolol succinate (TOPROL-XL) 25 MG 24 hr tablet, TAKE 1 TABLET BY MOUTH TWICE DAILY, Disp: 60 tablet, Rfl: 10 .  Multiple Vitamin (MULTIVITAMIN) tablet, Take 1 tablet by mouth daily., Disp: , Rfl:  .  Omega-3 1000 MG CAPS, Take by mouth daily. , Disp: , Rfl:  .  omeprazole (PRILOSEC) 20 MG capsule, TAKE 1 CAPSULE BY MOUTH TWICE DAILY FOR REFLUX., Disp: 60 capsule, Rfl: 1 .  pantoprazole (PROTONIX) 40 MG tablet, TAKE 1 TABLET BY MOUTH ONCE DAILY., Disp: 30 tablet, Rfl: 2 .  pravastatin (PRAVACHOL) 20 MG tablet, TAKE 1 TABLET BY MOUTH ONCE DAILY., Disp: 30 tablet, Rfl: 2  ROS  Constitutional: Denies any fever or chills Gastrointestinal: No  reported hemesis, hematochezia, vomiting, or acute GI distress Musculoskeletal: Denies any acute onset joint swelling, redness, loss of ROM, or weakness Neurological: No reported episodes of acute onset apraxia, aphasia, dysarthria, agnosia, amnesia, paralysis, loss of coordination, or loss of consciousness  Allergies  Wanda Martinez is allergic to sulfa antibiotics and vesicare [solifenacin].  Wanda Martinez  Drug: Wanda Martinez  reports that she does not use drugs. Alcohol:  reports that she does not drink alcohol. Tobacco:  reports that she has never smoked. She has never used smokeless tobacco. Medical:  has a past medical history of Anemia, Arm pain (07/26/2015), Arthritis, Arthritis, degenerative (03/26/2014), Back pain (11/01/2013), Bladder infection (06/2018), Breast cancer ([place]), Breast cancer ([place]), CHEST PAIN (04/29/2010), Chronic cystitis, Cystocele  (02/22/2013), Cystocele, midline (08/19/2013), Degeneration of intervertebral disc of lumbosacral region (03/26/2014), DYSPNEA (04/29/2010), Enthesopathy of hip (03/26/2014), GERD (gastroesophageal reflux disease), Hiatal hernia, HTN (hypertension), Hypokalemia (06/2018), Hyponatremia (06/2018), LBP (low back pain) (03/26/2014), Neck pain (11/01/2013), Parkinson disease ([place]), Pneumonia (06/2018), Sinusitis (02/07/2015), Skin lesions (07/12/2014), and Urinary incontinence. Surgical: Wanda Martinez  has a past surgical history that includes Breast biopsy (2013); Tonsillectomy and adenoidectomy (79); Vesicovaginal fistula closure w/ TAH (1983); Breast surgery (1986); Breast enhancement surgery (1987); Breast implant removal; Breast implant removal (Right, 08/29/2012); Mastectomy (08/2012); Abdominal hysterectomy; and Colonoscopy with propofol (N/A, 09/13/2016). Family: family history includes Colon polyps in her father; Diabetes in her father; Parkinson's disease in her mother; Stroke in her father and mother.  Constitutional Exam  General appearance: Well nourished, well developed, and well hydrated. In no apparent acute distress Vitals:   07/17/18 1040  BP: (!) 158/96  Pulse: 72  Resp: 16  Temp: 98.1 F (36.7 C)  SpO2: 100%  Weight: 180 lb (81.6 kg)  Height: '5\' 4"'$  (1.626 m)   BMI Assessment: Estimated body mass index is 30.9 kg/m as calculated from the following:   Height as of this encounter: '5\' 4"'$  (1.626 m).   Weight as of this encounter: 180 lb (81.6 kg).  BMI interpretation table: BMI level Category Range association with higher incidence of chronic pain  <18 kg/m2 Underweight   18.5-24.9 kg/m2 Ideal body weight   25-29.9 kg/m2 Overweight Increased incidence by 20%  30-34.9 kg/m2 Obese (Class I) Increased incidence by 68%  35-39.9 kg/m2 Severe obesity (Class II) Increased incidence by 136%  >40 kg/m2 Extreme obesity (Class III) Increased incidence by 254%   Patient's current BMI Ideal Body weight   Body mass index is 30.9 kg/m. Ideal body weight: 54.7 kg (120 lb 9.5 oz) Adjusted ideal body weight: 65.5 kg (144 lb 5.7 oz)   BMI Readings from Last 4 Encounters:  07/17/18 30.90 kg/m  07/09/18 31.48 kg/m  06/04/18 30.90 kg/m  05/27/18 30.90 kg/m   Wt Readings from Last 4 Encounters:  07/17/18 180 lb (81.6 kg)  07/09/18 183 lb 6.4 oz (83.2 kg)  06/04/18 180 lb (81.6 kg)  05/27/18 180 lb (81.6 kg)  Psych/Mental status: Alert, oriented x 3 (person, place, & time)       Eyes: PERLA Respiratory: No evidence of acute respiratory distress  Cervical Spine Area Exam  Skin & Axial Inspection: No masses, redness, edema, swelling, or associated skin lesions Alignment: Symmetrical Functional ROM: Unrestricted ROM      Stability: No instability detected Muscle Tone/Strength: Functionally intact. No obvious neuro-muscular anomalies detected. Sensory (Neurological): Unimpaired Palpation: No palpable anomalies              Upper Extremity (UE) Exam    Side: Right  upper extremity  Side: Left upper extremity  Skin & Extremity Inspection: Skin color, temperature, and hair growth are WNL. No peripheral edema or cyanosis. No masses, redness, swelling, asymmetry, or associated skin lesions. No contractures.  Skin & Extremity Inspection: Skin color, temperature, and hair growth are WNL. No peripheral edema or cyanosis. No masses, redness, swelling, asymmetry, or associated skin lesions. No contractures.  Functional ROM: Unrestricted ROM          Functional ROM: Unrestricted ROM          Muscle Tone/Strength: Functionally intact. No obvious neuro-muscular anomalies detected.  Muscle Tone/Strength: Functionally intact. No obvious neuro-muscular anomalies detected.  Sensory (Neurological): Unimpaired          Sensory (Neurological): Unimpaired          Palpation: No palpable anomalies              Palpation: No palpable anomalies              Provocative Test(s):  Phalen's test: deferred Tinel's  test: deferred Apley's scratch test (touch opposite shoulder):  Action 1 (Across chest): deferred Action 2 (Overhead): deferred Action 3 (LB reach): deferred   Provocative Test(s):  Phalen's test: deferred Tinel's test: deferred Apley's scratch test (touch opposite shoulder):  Action 1 (Across chest): deferred Action 2 (Overhead): deferred Action 3 (LB reach): deferred    Thoracic Spine Area Exam  Skin & Axial Inspection: No masses, redness, or swelling Alignment: Symmetrical Functional ROM: Unrestricted ROM Stability: No instability detected Muscle Tone/Strength: Functionally intact. No obvious neuro-muscular anomalies detected. Sensory (Neurological): Unimpaired Muscle strength & Tone: No palpable anomalies  Lumbar Spine Area Exam  Skin & Axial Inspection: No masses, redness, or swelling Alignment: Symmetrical Functional ROM: Unrestricted ROM       Stability: No instability detected Muscle Tone/Strength: Functionally intact. No obvious neuro-muscular anomalies detected. Sensory (Neurological): Unimpaired Palpation: No palpable anomalies       Provocative Tests: Lumbar Hyperextension/rotation test: deferred today       Lumbar quadrant test (Kemp's test): deferred today       Lumbar Lateral bending test: deferred today       Patrick's Maneuver: deferred today                   FABER test: deferred today                   Thigh-thrust test: deferred today       S-I compression test: deferred today       S-I distraction test: deferred today        Gait & Posture Assessment  Ambulation: Unassisted Gait: Relatively normal for age and body habitus Posture: WNL   Lower Extremity Exam    Side: Right lower extremity  Side: Left lower extremity  Stability: No instability observed          Stability: No instability observed          Skin & Extremity Inspection: Skin color, temperature, and hair growth are WNL. No peripheral edema or cyanosis. No masses, redness, swelling,  asymmetry, or associated skin lesions. No contractures.  Skin & Extremity Inspection: Skin color, temperature, and hair growth are WNL. No peripheral edema or cyanosis. No masses, redness, swelling, asymmetry, or associated skin lesions. No contractures.  Functional ROM: Unrestricted ROM                  Functional ROM: Unrestricted ROM  Muscle Tone/Strength: Functionally intact. No obvious neuro-muscular anomalies detected.  Muscle Tone/Strength: Functionally intact. No obvious neuro-muscular anomalies detected.  Sensory (Neurological): Unimpaired  Sensory (Neurological): Unimpaired  Palpation: No palpable anomalies  Palpation: No palpable anomalies   Assessment  Primary Diagnosis & Pertinent Problem List: The primary encounter diagnosis was Chronic low back pain (Primary Source of Pain) (Bilateral) (R>L). Diagnoses of Chronic lower extremity pain (Secondary Area of Pain) (Right), Chronic pain syndrome, Pharmacologic therapy, Disorder of skeletal system, and Problems influencing health status were also pertinent to this visit.  Status Diagnosis  Resolved Controlled Controlled 1. Chronic low back pain (Primary Source of Pain) (Bilateral) (R>L)   2. Chronic lower extremity pain (Secondary Area of Pain) (Right)   3. Chronic pain syndrome   4. Pharmacologic therapy   5. Disorder of skeletal system   6. Problems influencing health status     Problems updated and reviewed during this visit: Problem  Pharmacologic Therapy  Disorder of Skeletal System  Problems Influencing Health Status  Fall  Gerd (Gastroesophageal Reflux Disease)  Constipation Due to Opioid Therapy   Plan of Care  Pharmacotherapy (Medications Ordered): Meds ordered this encounter  Medications  . HYDROcodone-acetaminophen (NORCO/VICODIN) 5-325 MG tablet    Sig: Take 1 tablet by mouth every 8 (eight) hours as needed for severe pain.    Dispense:  90 tablet    Refill:  0    Medication for Chronic Pain  (G89.4). [place] STOP ACT - Not applicable. Fill one day early if pharmacy is closed on scheduled refill date.  Do not fill until: 09/15/18 To last until: 10/15/18  . HYDROcodone-acetaminophen (NORCO/VICODIN) 5-325 MG tablet    Sig: Take 1 tablet by mouth every 8 (eight) hours as needed for severe pain.    Dispense:  90 tablet    Refill:  0    Medication for Chronic Pain (G89.4). [place] STOP ACT - Not applicable. Fill one day early if pharmacy is closed on scheduled refill date.  Do not fill until: 08/16/18 To last until: 09/15/18  . HYDROcodone-acetaminophen (NORCO/VICODIN) 5-325 MG tablet    Sig: Take 1 tablet by mouth every 8 (eight) hours as needed for severe pain.    Dispense:  90 tablet    Refill:  0    Medication for Chronic Pain (G89.4). [place] STOP ACT - Not applicable. Fill one day early if pharmacy is closed on scheduled refill date.  Do not fill until: 07/17/18 To last until: 08/16/18   Medications administered today: Wanda Martinez" had no medications administered during this visit.  Procedure Orders    No procedure(s) ordered today    Lab Orders     Magnesium     Vitamin B12     Sedimentation rate     25-Hydroxyvitamin D Lcms D2+D3     C-reactive protein Imaging Orders  No imaging studies ordered today   Referral Orders  No referral(s) requested today    Interventional management options: Planned, scheduled, and/or pending:   None at this time    Considering:   Diagnosticright-sided lumbar facet block  Palliative right-sided lumbar facet radiofrequency ablation  PalliativeRight suprascapular muscle trigger point injection  Diagnosticbilateral cervical facet block  Possiblebilateral cervical facet radiofrequency ablation  Diagnostic right-sided cervical epidural steroid injection  Diagnosticintra-articular hip joint injection  Possible hip joint radiofrequency ablation   Palliative PRN treatment(s):   Diagnostic right-sided lumbar facet block   Palliative right-sided lumbar facet RFA #3(last done on 06/04/18) Palliative Right suprascapular muscle trigger  point injection  Diagnosticbilateral cervical facet block  Diagnosticright-sided cervical epidural steroid injection Diagnosticintra-articular hip joint injection Palliative right-sided L3-4 interlaminar LESI #3  Palliative right-sided L5 transforaminal LESI #3    Provider-requested follow-up: No follow-ups on file.  Future Appointments  Date Time Provider [place]  08/20/2018  4:00 PM Einar Pheasant, MD [facility]  10/09/2018 11:30 AM Vevelyn Francois, NP [facility] None  04/08/2019 11:15 AM Lloyd Huger, MD [facility] None  07/10/2019  9:30 AM Einar Pheasant, MD [facility]  07/10/2019 10:00 AM O'Brien-Blaney, Bryson Corona, LPN [facility]   Primary Care Physician: Einar Pheasant, MD Location: [facility] Note by: Gaspar Cola, MD Date: 07/17/2018; Time: 2:01 PM

## 2018-07-21 LAB — VITAMIN B12: Vitamin B-12: 1235 pg/mL (ref 232–1245)

## 2018-07-21 LAB — 25-HYDROXYVITAMIN D LCMS D2+D3
25-HYDROXY, VITAMIN D-3: 42 ng/mL
25-HYDROXY, VITAMIN D: 42 ng/mL

## 2018-07-21 LAB — MAGNESIUM: Magnesium: 2.2 mg/dL (ref 1.6–2.3)

## 2018-07-21 LAB — SEDIMENTATION RATE: Sed Rate: 27 mm/hr (ref 0–40)

## 2018-07-21 LAB — C-REACTIVE PROTEIN: CRP: 3 mg/L (ref 0–10)

## 2018-07-23 ENCOUNTER — Other Ambulatory Visit: Payer: Self-pay | Admitting: Internal Medicine

## 2018-07-25 DIAGNOSIS — M545 Low back pain: Secondary | ICD-10-CM | POA: Diagnosis not present

## 2018-07-25 DIAGNOSIS — M6281 Muscle weakness (generalized): Secondary | ICD-10-CM | POA: Diagnosis not present

## 2018-07-29 DIAGNOSIS — M6281 Muscle weakness (generalized): Secondary | ICD-10-CM | POA: Diagnosis not present

## 2018-07-29 DIAGNOSIS — M545 Low back pain: Secondary | ICD-10-CM | POA: Diagnosis not present

## 2018-07-30 DIAGNOSIS — M542 Cervicalgia: Secondary | ICD-10-CM | POA: Diagnosis not present

## 2018-07-30 DIAGNOSIS — G8929 Other chronic pain: Secondary | ICD-10-CM | POA: Diagnosis not present

## 2018-07-30 DIAGNOSIS — R251 Tremor, unspecified: Secondary | ICD-10-CM | POA: Diagnosis not present

## 2018-07-30 DIAGNOSIS — M25511 Pain in right shoulder: Secondary | ICD-10-CM | POA: Diagnosis not present

## 2018-07-30 DIAGNOSIS — M545 Low back pain: Secondary | ICD-10-CM | POA: Diagnosis not present

## 2018-07-30 DIAGNOSIS — I10 Essential (primary) hypertension: Secondary | ICD-10-CM | POA: Diagnosis not present

## 2018-07-30 DIAGNOSIS — F4321 Adjustment disorder with depressed mood: Secondary | ICD-10-CM | POA: Diagnosis not present

## 2018-07-31 ENCOUNTER — Encounter: Payer: Self-pay | Admitting: *Deleted

## 2018-08-01 DIAGNOSIS — M6281 Muscle weakness (generalized): Secondary | ICD-10-CM | POA: Diagnosis not present

## 2018-08-01 DIAGNOSIS — M545 Low back pain: Secondary | ICD-10-CM | POA: Diagnosis not present

## 2018-08-06 DIAGNOSIS — M6281 Muscle weakness (generalized): Secondary | ICD-10-CM | POA: Diagnosis not present

## 2018-08-06 DIAGNOSIS — M545 Low back pain: Secondary | ICD-10-CM | POA: Diagnosis not present

## 2018-08-08 DIAGNOSIS — M545 Low back pain: Secondary | ICD-10-CM | POA: Diagnosis not present

## 2018-08-08 DIAGNOSIS — M6281 Muscle weakness (generalized): Secondary | ICD-10-CM | POA: Diagnosis not present

## 2018-08-13 DIAGNOSIS — M545 Low back pain: Secondary | ICD-10-CM | POA: Diagnosis not present

## 2018-08-13 DIAGNOSIS — M6281 Muscle weakness (generalized): Secondary | ICD-10-CM | POA: Diagnosis not present

## 2018-08-15 DIAGNOSIS — M6281 Muscle weakness (generalized): Secondary | ICD-10-CM | POA: Diagnosis not present

## 2018-08-15 DIAGNOSIS — M545 Low back pain: Secondary | ICD-10-CM | POA: Diagnosis not present

## 2018-08-20 ENCOUNTER — Ambulatory Visit: Payer: Medicare Other | Admitting: Internal Medicine

## 2018-08-21 DIAGNOSIS — M6281 Muscle weakness (generalized): Secondary | ICD-10-CM | POA: Diagnosis not present

## 2018-08-21 DIAGNOSIS — M545 Low back pain: Secondary | ICD-10-CM | POA: Diagnosis not present

## 2018-08-22 DIAGNOSIS — M545 Low back pain: Secondary | ICD-10-CM | POA: Diagnosis not present

## 2018-08-22 DIAGNOSIS — M6281 Muscle weakness (generalized): Secondary | ICD-10-CM | POA: Diagnosis not present

## 2018-08-23 ENCOUNTER — Ambulatory Visit (INDEPENDENT_AMBULATORY_CARE_PROVIDER_SITE_OTHER): Payer: Medicare Other | Admitting: Internal Medicine

## 2018-08-23 ENCOUNTER — Ambulatory Visit (INDEPENDENT_AMBULATORY_CARE_PROVIDER_SITE_OTHER): Payer: Medicare Other

## 2018-08-23 ENCOUNTER — Encounter: Payer: Self-pay | Admitting: Internal Medicine

## 2018-08-23 ENCOUNTER — Other Ambulatory Visit (HOSPITAL_COMMUNITY)
Admission: RE | Admit: 2018-08-23 | Discharge: 2018-08-23 | Disposition: A | Discharge status: Home / Self Care | Payer: Medicare Other | Source: Ambulatory Visit | Attending: Internal Medicine | Admitting: Internal Medicine

## 2018-08-23 VITALS — BP 130/78 | HR 63 | Temp 98.1°F | Resp 16 | Wt 186.5 lb

## 2018-08-23 DIAGNOSIS — E871 Hypo-osmolality and hyponatremia: Secondary | ICD-10-CM

## 2018-08-23 DIAGNOSIS — R300 Dysuria: Secondary | ICD-10-CM

## 2018-08-23 DIAGNOSIS — D649 Anemia, unspecified: Secondary | ICD-10-CM

## 2018-08-23 DIAGNOSIS — G20 Parkinson's disease: Secondary | ICD-10-CM | POA: Diagnosis not present

## 2018-08-23 DIAGNOSIS — N182 Chronic kidney disease, stage 2 (mild): Secondary | ICD-10-CM | POA: Diagnosis not present

## 2018-08-23 DIAGNOSIS — E7800 Pure hypercholesterolemia, unspecified: Secondary | ICD-10-CM

## 2018-08-23 DIAGNOSIS — I10 Essential (primary) hypertension: Secondary | ICD-10-CM

## 2018-08-23 DIAGNOSIS — K219 Gastro-esophageal reflux disease without esophagitis: Secondary | ICD-10-CM

## 2018-08-23 DIAGNOSIS — R918 Other nonspecific abnormal finding of lung field: Secondary | ICD-10-CM | POA: Diagnosis not present

## 2018-08-23 DIAGNOSIS — Z23 Encounter for immunization: Secondary | ICD-10-CM | POA: Diagnosis not present

## 2018-08-23 DIAGNOSIS — R9389 Abnormal findings on diagnostic imaging of other specified body structures: Secondary | ICD-10-CM

## 2018-08-23 DIAGNOSIS — N760 Acute vaginitis: Secondary | ICD-10-CM | POA: Diagnosis not present

## 2018-08-23 LAB — URINALYSIS, ROUTINE W REFLEX MICROSCOPIC
Bilirubin Urine: NEGATIVE
HGB URINE DIPSTICK: NEGATIVE
Ketones, ur: NEGATIVE
NITRITE: NEGATIVE
RBC / HPF: NONE SEEN (ref 0–?)
SPECIFIC GRAVITY, URINE: 1.01 (ref 1.000–1.030)
Total Protein, Urine: NEGATIVE
URINE GLUCOSE: NEGATIVE
UROBILINOGEN UA: 0.2 (ref 0.0–1.0)
pH: 7 (ref 5.0–8.0)

## 2018-08-23 LAB — BASIC METABOLIC PANEL
BUN: 14 mg/dL (ref 6–23)
CHLORIDE: 100 meq/L (ref 96–112)
CO2: 31 meq/L (ref 19–32)
Calcium: 9.7 mg/dL (ref 8.4–10.5)
Creatinine, Ser: 1.1 mg/dL (ref 0.40–1.20)
GFR: 50.69 mL/min — ABNORMAL LOW (ref >60.00)
GLUCOSE: 88 mg/dL (ref 70–99)
Potassium: 3.9 mEq/L (ref 3.5–5.1)
SODIUM: 137 meq/L (ref 135–145)

## 2018-08-23 NOTE — Progress Notes (Signed)
Patient ID: Cassundra Mckeever, female   DOB: August 09, 1935, 83 y.o.   MRN: [id number]   Subjective:    Patient ID: [name], female    DOB: 1935/07/31, 83 y.o.   MRN: [id number]  HPI  Patient here for a scheduled follow up.   Previously had stopped hctz.  She started noticing some feet and ankle swelling.  Restarted 1/2 HCTZ.  Blood pressure doing ok.  Tries to stay active.  No chest pain.  No sob.  No acid reflux.  Does report increased urinary frequency and dysuria.  Present over the last week.  Started AZO.  Also drinking cranberry juice.  Some vaginal discharge.  Bowels stable.     Past Medical History:  Diagnosis Date  . Anemia   . Arm pain 07/26/2015  . Arthritis   . Arthritis, degenerative 03/26/2014  . Back pain 11/01/2013  . Bladder infection 06/2018  . Breast cancer ([place])    Masectomy - left - 1986   . Breast cancer [facility])    Mastectomy-right -2014  . CHEST PAIN 04/29/2010   Qualifier: Diagnosis of  By: Wynetta Emery RN, Doroteo Bradford    . Chronic cystitis   . Cystocele 02/22/2013  . Cystocele, midline 08/19/2013  . Degeneration of intervertebral disc of lumbosacral region 03/26/2014  . DYSPNEA 04/29/2010   Qualifier: Diagnosis of  By: Wynetta Emery RN, Doroteo Bradford    . Enthesopathy of hip 03/26/2014  . GERD (gastroesophageal reflux disease)   . Hiatal hernia   . HTN (hypertension)   . Hypokalemia 06/2018  . Hyponatremia 06/2018  . LBP (low back pain) 03/26/2014  . Neck pain 11/01/2013  . Parkinson disease ([place])   . Pneumonia 06/2018  . Sinusitis 02/07/2015  . Skin lesions 07/12/2014  . Urinary incontinence    mixed    Past Surgical History:  Procedure Laterality Date  . ABDOMINAL HYSTERECTOMY    . BREAST BIOPSY  2013  . BREAST ENHANCEMENT SURGERY  1987  . BREAST IMPLANT REMOVAL    . BREAST IMPLANT REMOVAL Right 08/29/2012  . BREAST SURGERY  1986   s/p left mastectomy  . COLONOSCOPY WITH PROPOFOL N/A 09/13/2016   Procedure: COLONOSCOPY WITH PROPOFOL;  Surgeon: Manya Silvas, MD;   Location[facility] ENDOSCOPY;  Service: Endoscopy;  Laterality: N/A;  . MASTECTOMY  08/2012   right  . TONSILLECTOMY AND ADENOIDECTOMY  79  . VESICOVAGINAL FISTULA CLOSURE W/ TAH  1983   Family History  Problem Relation Age of Onset  . Diabetes Father   . Stroke Father   . Colon polyps Father   . Stroke Mother   . Parkinson's disease Mother    Social History   Socioeconomic History  . Marital status: Married    Spouse name: Not on file  . Number of children: 3  . Years of education: Not on file  . Highest education level: Not on file  Occupational History  . Not on file  Social Needs  . Financial resource strain: Not on file  . Food insecurity:    Worry: Not on file    Inability: Not on file  . Transportation needs:    Medical: Not on file    Non-medical: Not on file  Tobacco Use  . Smoking status: Never Smoker  . Smokeless tobacco: Never Used  . Tobacco comment: tobacco use - no  Substance and Sexual Activity  . Alcohol use: No    Alcohol/week: 0.0 standard drinks  . Drug use: No  . Sexual activity: Never  Lifestyle  . Physical activity:    Days per week: Not on file    Minutes per session: Not on file  . Stress: Not on file  Relationships  . Social connections:    Talks on phone: Not on file    Gets together: Not on file    Attends religious service: Not on file    Active member of club or organization: Not on file    Attends meetings of clubs or organizations: Not on file    Relationship status: Not on file  Other Topics Concern  . Not on file  Social History Narrative  . Not on file    Current Outpatient Medications on File Prior to Visit  Medication Sig Dispense Refill  . acyclovir ointment (ZOVIRAX) 5 % Apply 1 application topically 4 (four) times daily. 15 g 0  . amLODipine (NORVASC) 2.5 MG tablet TAKE 1 TABLET BY MOUTH ONCE DAILY. 30 tablet 0  . aspirin 81 MG EC tablet Take 81 mg by mouth daily as needed.      . Calcium Carbonate (CALCIUM 600) 1500  MG TABS Take 1 tablet by mouth daily.      . citalopram (CELEXA) 20 MG tablet TAKE 1 & 1/2 TABLETS BY MOUTH ONCE DAILY    . cyclobenzaprine (FLEXERIL) 10 MG tablet Take 1 tablet (10 mg total) by mouth at bedtime. 90 tablet 0  . gabapentin (NEURONTIN) 100 MG capsule Take 200 mg by mouth 4 (four) times daily.   1  . GLUCOSAMINE SULFATE PO Take by mouth daily.     Derrill Memo ON 09/15/2018] HYDROcodone-acetaminophen (NORCO/VICODIN) 5-325 MG tablet Take 1 tablet by mouth every 8 (eight) hours as needed for severe pain. 90 tablet 0  . HYDROcodone-acetaminophen (NORCO/VICODIN) 5-325 MG tablet Take 1 tablet by mouth every 8 (eight) hours as needed for severe pain. 90 tablet 0  . losartan (COZAAR) 100 MG tablet TAKE 1 TABLET BY MOUTH ONCE DAILY. 30 tablet 0  . lubiprostone (AMITIZA) 24 MCG capsule TAKE 1 CAPSULE BY MOUTH ONCE DAILY WITH BREAKFAST.    . Magnesium Oxide 400 (240 Mg) MG TABS   0  . meloxicam (MOBIC) 7.5 MG tablet Take 1 tablet (7.5 mg total) by mouth daily. 90 tablet 0  . metoprolol succinate (TOPROL-XL) 25 MG 24 hr tablet TAKE 1 TABLET BY MOUTH TWICE DAILY 60 tablet 10  . Multiple Vitamin (MULTIVITAMIN) tablet Take 1 tablet by mouth daily.    Marland Kitchen omeprazole (PRILOSEC) 20 MG capsule TAKE 1 CAPSULE BY MOUTH TWICE DAILY FOR REFLUX. 60 capsule 1  . pantoprazole (PROTONIX) 40 MG tablet TAKE 1 TABLET BY MOUTH ONCE DAILY. 30 tablet 0  . pravastatin (PRAVACHOL) 20 MG tablet TAKE 1 TABLET BY MOUTH ONCE DAILY. 30 tablet 0  . HYDROcodone-acetaminophen (NORCO/VICODIN) 5-325 MG tablet Take 1 tablet by mouth every 8 (eight) hours as needed for severe pain. 90 tablet 0  . Omega-3 1000 MG CAPS Take by mouth daily.      No current facility-administered medications on file prior to visit.    Review of Systems  Constitutional: Negative for appetite change and fever.  HENT: Negative for congestion and sinus pressure.   Respiratory: Negative for cough, chest tightness and shortness of breath.   Cardiovascular:  Negative for chest pain, palpitations and leg swelling.  Gastrointestinal: Negative for abdominal pain, nausea and vomiting.  Genitourinary: Positive for dysuria and vaginal discharge.  Musculoskeletal: Negative for joint swelling and myalgias.  Skin: Negative for color change and  rash.  Neurological: Negative for dizziness, light-headedness and headaches.  Psychiatric/Behavioral: Negative for agitation and dysphoric mood.       Objective:    Physical Exam  Constitutional: She appears well-developed and well-nourished. No distress.  HENT:  Nose: Nose normal.  Mouth/Throat: Oropharynx is clear and moist.  Neck: Neck supple. No thyromegaly present.  Cardiovascular: Normal rate and regular rhythm.  Pulmonary/Chest: Breath sounds normal. No respiratory distress. She has no wheezes.  Abdominal: Soft. Bowel sounds are normal. There is no tenderness.  Genitourinary:  Genitourinary Comments: Normal external genitalia.  Vaginal vault without lesions.  KOH/wet prep obtained.  Could not appreciate any adnexal masses or tenderness.    Musculoskeletal: She exhibits no edema or tenderness.  Lymphadenopathy:    She has no cervical adenopathy.  Skin: No rash noted. No erythema.  Psychiatric: She has a normal mood and affect. Her behavior is normal.    BP 130/78 (BP Location: Left Arm, Patient Position: Sitting, Cuff Size: Large)   Pulse 63   Temp 98.1 F (36.7 C) (Oral)   Resp 16   Wt 186 lb 8 oz (84.6 kg)   LMP 12/18/1981   SpO2 98%   BMI 32.01 kg/m  Wt Readings from Last 3 Encounters:  08/23/18 186 lb 8 oz (84.6 kg)  07/17/18 180 lb (81.6 kg)  07/09/18 183 lb 6.4 oz (83.2 kg)     Lab Results  Component Value Date   WBC 5.7 07/09/2018   HGB 11.8 (L) 07/09/2018   HCT 34.9 (L) 07/09/2018   PLT 432.0 (H) 07/09/2018   GLUCOSE 88 08/23/2018   CHOL 157 07/09/2018   TRIG 236.0 (H) 07/09/2018   HDL 35.10 (L) 07/09/2018   LDLDIRECT 73.0 07/09/2018   LDLCALC 99 03/04/2018   ALT 25  07/09/2018   AST 17 07/09/2018   NA 137 08/23/2018   K 3.9 08/23/2018   CL 100 08/23/2018   CREATININE 1.10 08/23/2018   BUN 14 08/23/2018   CO2 31 08/23/2018   TSH 2.12 03/04/2018    Dg Bone Density  Result Date: 07/10/2018 EXAM: DUAL X-RAY ABSORPTIOMETRY (DXA) FOR BONE MINERAL DENSITY IMPRESSION: Dear Dr. Grayland Ormond, Your patient [name] completed a BMD test on 07/10/2018 using the [place] (analysis version: 14.10) manufactured by [employer]. The following summarizes the results of our evaluation. PATIENT BIOGRAPHICAL: Name: Jillann, Charette Patient ID: [id number] Birth Date: 02-20-1935 Height: 64.0 in. Gender: Female Exam Date: 07/10/2018 Weight: 184.5 lbs. Indications: Osteoarthritis, Hysterectomy, Advanced Age, Breast CA, Height Loss, Postmenopausal, Caucasian Fractures: Treatments: Calcium, Vitamin D ASSESSMENT: The BMD measured at Forearm Radius 33% is 0.647 g/cm2 with a T-score of -2.6. This patient is considered osteoporotic according to [facility]) criteria. Lumbar spine was not utilized due to (advanced degenerative changes/scoliosis, etc.) Site Region Measured Measured WHO Young Adult BMD Date       Age      Classification T-score DualFemur Neck Left 07/10/2018 80.7 Osteopenia -2.4 0.699 g/cm2 DualFemur Neck Left 06/25/2017 79.6 Osteopenia -2.3 0.718 g/cm2 DualFemur Neck Left 06/21/2016 78.6 Osteopenia -2.3 0.712 g/cm2 DualFemur Neck Left 06/15/2015 77.6 Osteopenia -1.8 0.788 g/cm2 DualFemur Neck Left 08/21/2013 75.8 Osteopenia -2.0 0.764 g/cm2 DualFemur Total Mean 07/10/2018 80.7 Osteopenia -2.1 0.743 g/cm2 DualFemur Total Mean 06/25/2017 79.6 Osteopenia -2.2 0.734 g/cm2 DualFemur Total Mean 06/21/2016 78.6 Osteopenia -2.1 0.742 g/cm2 DualFemur Total Mean 06/15/2015 77.6 Osteopenia -2.0 0.756 g/cm2 DualFemur Total Mean 08/21/2013 75.8 Osteopenia -1.7 0.789 g/cm2 Left Forearm Radius 33% 07/10/2018 80.7 Osteoporosis -2.6  0.647 g/cm2 Left Forearm Radius 33%  06/25/2017 79.6 Osteopenia -2.1 0.695 g/cm2 Left Forearm Radius 33% 06/21/2016 78.6 Osteopenia -1.9 0.712 g/cm2 Left Forearm Radius 33% 06/15/2015 77.6 Osteoporosis -2.7 0.638 g/cm2 Left Forearm Radius 33% 08/21/2013 75.8 Osteopenia -1.7 0.724 g/cm2 World Health Organization [facility]) criteria for post-menopausal, Caucasian Women: Normal:       T-score at or above -1 SD Osteopenia:   T-score between -1 and -2.5 SD Osteoporosis: T-score at or below -2.5 SD RECOMMENDATIONS: 1. All patients should optimize calcium and vitamin D intake. 2. Consider FDA-approved medical therapies in postmenopausal women and men aged 47 years and older, based on the following: a. A hip or vertebral(clinical or morphometric) fracture b. T-score < -2.5 at the femoral neck or spine after appropriate evaluation to exclude secondary causes c. Low bone mass (T-score between -1.0 and -2.5 at the femoral neck or spine) and a 10-year probability of a hip fracture > 3% or a 10-year probability of a major osteoporosis-related fracture > 20% based on the [place]-adapted WHO algorithm d. Clinician judgment and/or patient preferences may indicate treatment for people with 10-year fracture probabilities above or below these levels FOLLOW-UP: People with diagnosed cases of osteoporosis or at high risk for fracture should have regular bone mineral density tests. For patients eligible for Medicare, routine testing is allowed once every 2 years. The testing frequency can be increased to one year for patients who have rapidly progressing disease, those who are receiving or discontinuing medical therapy to restore bone mass, or have additional risk factors. I have reviewed this report, and agree with the above findings. [facility] Radiology Electronically Signed   By: Lowella Grip III M.D.   On: 07/10/2018 10:50       Assessment & Plan:   Problem List Items Addressed This Visit    Anemia    Colonoscopy 09/13/16 - diverticulosis and internal hemorrhoids.   Follow cbc.        Chronic kidney disease (CKD), stage II (mild)    Had stopped her hctz.  She started noticing feet and legs swelling.  Started back on 1/2 hctz and has been on this medication for two weeks.  Follow metabolic panel.        Dysuria   Relevant Orders   Urinalysis, Routine w reflex microscopic (Completed)   Urine Culture (Completed)   Essential hypertension    Back on 1/2 hctz.  Blood pressure doing well.  Will continue current medication for now.  Follow metabolic panel.        GERD (gastroesophageal reflux disease)    Controlled on current regimen.  Follow.        Hypercholesterolemia    On pravastatin.  Low cholesterol diet and exercise.  Follow lipid panel and liver function tests.        Hyponatremia   Relevant Orders   Basic metabolic panel (Completed)   Parkinsons ([place])    Followed by neurology.  Stable.        Vaginitis    With some vaginal irritation, will check wet prep to confirm no infection.        Relevant Orders   Cervicovaginal ancillary only    Other Visit Diagnoses    Abnormal CXR    -  Primary   Previous pneumonia.  symptoms resolved.  recheck cxr.     Relevant Orders   DG Chest 2 View (Completed)   Encounter for immunization       Relevant Orders   Flu vaccine HIGH DOSE  PF (Completed)       Einar Pheasant, MD

## 2018-08-25 ENCOUNTER — Encounter: Payer: Self-pay | Admitting: Internal Medicine

## 2018-08-25 LAB — URINE CULTURE
MICRO NUMBER:: 91068219
Result:: NO GROWTH
SPECIMEN QUALITY:: ADEQUATE

## 2018-08-25 NOTE — Assessment & Plan Note (Signed)
On pravastatin.  Low cholesterol diet and exercise.  Follow lipid panel and liver function tests.   

## 2018-08-25 NOTE — Assessment & Plan Note (Signed)
Had stopped her hctz.  She started noticing feet and legs swelling.  Started back on 1/2 hctz and has been on this medication for two weeks.  Follow metabolic panel.

## 2018-08-25 NOTE — Assessment & Plan Note (Signed)
Colonoscopy 09/13/16 - diverticulosis and internal hemorrhoids.  Follow cbc.

## 2018-08-25 NOTE — Assessment & Plan Note (Signed)
Followed by neurology.  Stable  

## 2018-08-25 NOTE — Assessment & Plan Note (Signed)
With some vaginal irritation, will check wet prep to confirm no infection.

## 2018-08-25 NOTE — Assessment & Plan Note (Signed)
Controlled on current regimen.  Follow.  

## 2018-08-25 NOTE — Assessment & Plan Note (Signed)
Back on 1/2 hctz.  Blood pressure doing well.  Will continue current medication for now.  Follow metabolic panel.

## 2018-08-26 DIAGNOSIS — M545 Low back pain: Secondary | ICD-10-CM | POA: Diagnosis not present

## 2018-08-26 DIAGNOSIS — M6281 Muscle weakness (generalized): Secondary | ICD-10-CM | POA: Diagnosis not present

## 2018-08-26 LAB — CERVICOVAGINAL ANCILLARY ONLY: Wet Prep (BD Affirm): NEGATIVE

## 2018-09-02 DIAGNOSIS — Z23 Encounter for immunization: Secondary | ICD-10-CM | POA: Diagnosis not present

## 2018-09-03 DIAGNOSIS — M6281 Muscle weakness (generalized): Secondary | ICD-10-CM | POA: Diagnosis not present

## 2018-09-03 DIAGNOSIS — M545 Low back pain: Secondary | ICD-10-CM | POA: Diagnosis not present

## 2018-09-09 ENCOUNTER — Encounter: Payer: Self-pay | Admitting: Internal Medicine

## 2018-09-12 DIAGNOSIS — M6281 Muscle weakness (generalized): Secondary | ICD-10-CM | POA: Diagnosis not present

## 2018-09-12 DIAGNOSIS — M545 Low back pain: Secondary | ICD-10-CM | POA: Diagnosis not present

## 2018-09-19 DIAGNOSIS — M545 Low back pain: Secondary | ICD-10-CM | POA: Diagnosis not present

## 2018-09-19 DIAGNOSIS — M6281 Muscle weakness (generalized): Secondary | ICD-10-CM | POA: Diagnosis not present

## 2018-09-20 ENCOUNTER — Other Ambulatory Visit: Payer: Self-pay | Admitting: Internal Medicine

## 2018-09-20 DIAGNOSIS — M7918 Myalgia, other site: Secondary | ICD-10-CM

## 2018-09-20 DIAGNOSIS — M150 Primary generalized (osteo)arthritis: Principal | ICD-10-CM

## 2018-09-20 DIAGNOSIS — M159 Polyosteoarthritis, unspecified: Secondary | ICD-10-CM

## 2018-09-24 ENCOUNTER — Telehealth: Payer: Self-pay | Admitting: Radiology

## 2018-09-24 ENCOUNTER — Other Ambulatory Visit: Payer: Self-pay | Admitting: Internal Medicine

## 2018-09-24 DIAGNOSIS — R944 Abnormal results of kidney function studies: Secondary | ICD-10-CM

## 2018-09-24 NOTE — Telephone Encounter (Signed)
F/u met b ordered.

## 2018-09-24 NOTE — Telephone Encounter (Signed)
Pt coming in for labs tomorrow, please place future orders. Thank you.  

## 2018-09-24 NOTE — Progress Notes (Signed)
Order placed for f/u met b.  

## 2018-09-25 ENCOUNTER — Other Ambulatory Visit (INDEPENDENT_AMBULATORY_CARE_PROVIDER_SITE_OTHER): Payer: Medicare Other

## 2018-09-25 DIAGNOSIS — R944 Abnormal results of kidney function studies: Secondary | ICD-10-CM

## 2018-09-25 LAB — BASIC METABOLIC PANEL
BUN: 18 mg/dL (ref 6–23)
CALCIUM: 9.7 mg/dL (ref 8.4–10.5)
CO2: 33 meq/L — AB (ref 19–32)
Chloride: 99 mEq/L (ref 96–112)
Creatinine, Ser: 1.03 mg/dL (ref 0.40–1.20)
GFR: 54.67 mL/min — ABNORMAL LOW (ref >60.00)
GLUCOSE: 95 mg/dL (ref 70–99)
Potassium: 4.3 mEq/L (ref 3.5–5.1)
SODIUM: 135 meq/L (ref 135–145)

## 2018-10-09 ENCOUNTER — Encounter: Payer: Self-pay | Admitting: Nurse Practitioner

## 2018-10-09 ENCOUNTER — Ambulatory Visit: Payer: Medicare Other | Attending: Nurse Practitioner | Admitting: Nurse Practitioner

## 2018-10-09 VITALS — BP 157/82 | HR 74 | Temp 97.9°F | Resp 16 | Ht 64.0 in | Wt 180.0 lb

## 2018-10-09 DIAGNOSIS — M533 Sacrococcygeal disorders, not elsewhere classified: Secondary | ICD-10-CM | POA: Insufficient documentation

## 2018-10-09 DIAGNOSIS — Z79891 Long term (current) use of opiate analgesic: Secondary | ICD-10-CM | POA: Diagnosis not present

## 2018-10-09 DIAGNOSIS — M5137 Other intervertebral disc degeneration, lumbosacral region: Secondary | ICD-10-CM | POA: Diagnosis not present

## 2018-10-09 DIAGNOSIS — E7800 Pure hypercholesterolemia, unspecified: Secondary | ICD-10-CM | POA: Diagnosis not present

## 2018-10-09 DIAGNOSIS — M47812 Spondylosis without myelopathy or radiculopathy, cervical region: Secondary | ICD-10-CM

## 2018-10-09 DIAGNOSIS — M47817 Spondylosis without myelopathy or radiculopathy, lumbosacral region: Secondary | ICD-10-CM | POA: Diagnosis not present

## 2018-10-09 DIAGNOSIS — M7061 Trochanteric bursitis, right hip: Secondary | ICD-10-CM | POA: Diagnosis not present

## 2018-10-09 DIAGNOSIS — M545 Low back pain, unspecified: Secondary | ICD-10-CM

## 2018-10-09 DIAGNOSIS — M47816 Spondylosis without myelopathy or radiculopathy, lumbar region: Secondary | ICD-10-CM | POA: Diagnosis not present

## 2018-10-09 DIAGNOSIS — G894 Chronic pain syndrome: Secondary | ICD-10-CM

## 2018-10-09 DIAGNOSIS — M4807 Spinal stenosis, lumbosacral region: Secondary | ICD-10-CM | POA: Insufficient documentation

## 2018-10-09 DIAGNOSIS — G8929 Other chronic pain: Secondary | ICD-10-CM | POA: Diagnosis not present

## 2018-10-09 DIAGNOSIS — E785 Hyperlipidemia, unspecified: Secondary | ICD-10-CM | POA: Diagnosis not present

## 2018-10-09 DIAGNOSIS — R32 Unspecified urinary incontinence: Secondary | ICD-10-CM | POA: Insufficient documentation

## 2018-10-09 DIAGNOSIS — M7062 Trochanteric bursitis, left hip: Secondary | ICD-10-CM | POA: Insufficient documentation

## 2018-10-09 DIAGNOSIS — Z853 Personal history of malignant neoplasm of breast: Secondary | ICD-10-CM | POA: Diagnosis not present

## 2018-10-09 DIAGNOSIS — M7918 Myalgia, other site: Secondary | ICD-10-CM

## 2018-10-09 DIAGNOSIS — K219 Gastro-esophageal reflux disease without esophagitis: Secondary | ICD-10-CM | POA: Diagnosis not present

## 2018-10-09 DIAGNOSIS — G20 Parkinson's disease: Secondary | ICD-10-CM | POA: Insufficient documentation

## 2018-10-09 DIAGNOSIS — I10 Essential (primary) hypertension: Secondary | ICD-10-CM | POA: Diagnosis not present

## 2018-10-09 DIAGNOSIS — M542 Cervicalgia: Secondary | ICD-10-CM | POA: Diagnosis present

## 2018-10-09 MED ORDER — HYDROCODONE-ACETAMINOPHEN 5-325 MG PO TABS: 1.0000 | ORAL_TABLET | Freq: Three times a day (TID) | ORAL | 0 refills | Status: DC | PRN

## 2018-10-09 MED ORDER — ORPHENADRINE CITRATE 30 MG/ML IJ SOLN
30.0000 mg | Freq: Once | INTRAMUSCULAR | Status: AC
  Administered 2018-10-09: 30 mg via INTRAMUSCULAR
  Filled 2018-10-09: qty 2

## 2018-10-09 MED ORDER — KETOROLAC TROMETHAMINE 30 MG/ML IJ SOLN
30.0000 mg | Freq: Once | INTRAMUSCULAR | Status: AC
  Administered 2018-10-09: 30 mg via INTRAMUSCULAR
  Filled 2018-10-09: qty 1

## 2018-10-09 NOTE — Patient Instructions (Addendum)
____________________________________________________________________________________________  Medication Rules  Applies to: All patients receiving prescriptions (written or electronic).  Pharmacy of record: Pharmacy where electronic prescriptions will be sent. If written prescriptions are taken to a different pharmacy, please inform the nursing staff. The pharmacy listed in the electronic medical record should be the one where you would like electronic prescriptions to be sent.  Prescription refills: Only during scheduled appointments. Applies to both, written and electronic prescriptions.  NOTE: The following applies primarily to controlled substances (Opioid* Pain Medications).   Patient's responsibilities: 1. Pain Pills: Bring all pain pills to every appointment (except for procedure appointments). 2. Pill Bottles: Bring pills in original pharmacy bottle. Always bring newest bottle. Bring bottle, even if empty. 3. Medication refills: You are responsible for knowing and keeping track of what medications you need refilled. The day before your appointment, write a list of all prescriptions that need to be refilled. Bring that list to your appointment and give it to the admitting nurse. Prescriptions will be written only during appointments. If you forget a medication, it will not be "Called in", "Faxed", or "electronically sent". You will need to get another appointment to get these prescribed. 4. Prescription Accuracy: You are responsible for carefully inspecting your prescriptions before leaving our office. Have the discharge nurse carefully go over each prescription with you, before taking them home. Make sure that your name is accurately spelled, that your address is correct. Check the name and dose of your medication to make sure it is accurate. Check the number of pills, and the written instructions to make sure they are clear and accurate. Make sure that you are given enough medication to last  until your next medication refill appointment. 5. Taking Medication: Take medication as prescribed. Never take more pills than instructed. Never take medication more frequently than prescribed. Taking less pills or less frequently is permitted and encouraged, when it comes to controlled substances (written prescriptions).  6. Inform other Doctors: Always inform, all of your healthcare providers, of all the medications you take. 7. Pain Medication from other Providers: You are not allowed to accept any additional pain medication from any other Doctor or Healthcare provider. There are two exceptions to this rule. (see below) In the event that you require additional pain medication, you are responsible for notifying us, as stated below. 8. Medication Agreement: You are responsible for carefully reading and following our Medication Agreement. This must be signed before receiving any prescriptions from our practice. Safely store a copy of your signed Agreement. Violations to the Agreement will result in no further prescriptions. (Additional copies of our Medication Agreement are available upon request.) 9. Laws, Rules, & Regulations: All patients are expected to follow all Federal and State Laws, Statutes, Rules, & Regulations. Ignorance of the Laws does not constitute a valid excuse. The use of any illegal substances is prohibited. 10. Adopted CDC guidelines & recommendations: Target dosing levels will be at or below 60 MME/day. Use of benzodiazepines** is not recommended.  Exceptions: There are only two exceptions to the rule of not receiving pain medications from other Healthcare Providers. 1. Exception #1 (Emergencies): In the event of an emergency (i.e.: accident requiring emergency care), you are allowed to receive additional pain medication. However, you are responsible for: As soon as you are able, call our office [phone], at any time of the day or night, and leave a message stating your name, the  date and nature of the emergency, and the name and dose of the medication   prescribed. In the event that your call is answered by a member of our staff, make sure to document and save the date, time, and the name of the person that took your information.  2. Exception #2 (Planned Surgery): In the event that you are scheduled by another doctor or dentist to have any type of surgery or procedure, you are allowed (for a period no longer than 30 days), to receive additional pain medication, for the acute post-op pain. However, in this case, you are responsible for picking up a copy of our "Post-op Pain Management for Surgeons" handout, and giving it to your surgeon or dentist. This document is available at our office, and does not require an appointment to obtain it. Simply go to our office during business hours (Monday-Thursday from 8:00 AM to 4:00 PM) (Friday 8:00 AM to 12:00 Noon) or if you have a scheduled appointment with [place], prior to your surgery, and ask for it by name. In addition, you will need to provide [place] with your name, name of your surgeon, type of surgery, and date of procedure or surgery.  *Opioid medications include: morphine, codeine, oxycodone, oxymorphone, hydrocodone, hydromorphone, meperidine, tramadol, tapentadol, buprenorphine, fentanyl, methadone. **Benzodiazepine medications include: diazepam (Valium), alprazolam (Xanax), clonazepam (Klonopine), lorazepam (Ativan), clorazepate (Tranxene), chlordiazepoxide (Librium), estazolam (Prosom), oxazepam (Serax), temazepam (Restoril), triazolam (Halcion) (Last updated: 02/14/2018) ____________________________________________________________________________________________    BMI Assessment: Estimated body mass index is 30.9 kg/m as calculated from the following:   Height as of this encounter: 5\' 4"  (1.626 m).   Weight as of this encounter: 180 lb (81.6 kg).  BMI interpretation table: BMI level Category Range association with higher  incidence of chronic pain  <18 kg/m2 Underweight   18.5-24.9 kg/m2 Ideal body weight   25-29.9 kg/m2 Overweight Increased incidence by 20%  30-34.9 kg/m2 Obese (Class I) Increased incidence by 68%  35-39.9 kg/m2 Severe obesity (Class II) Increased incidence by 136%  >40 kg/m2 Extreme obesity (Class III) Increased incidence by 254%   Patient's current BMI Ideal Body weight  Body mass index is 30.9 kg/m. Ideal body weight: 54.7 kg (120 lb 9.5 oz) Adjusted ideal body weight: 65.5 kg (144 lb 5.7 oz)   BMI Readings from Last 4 Encounters:  10/09/18 30.90 kg/m  08/23/18 32.01 kg/m  07/17/18 30.90 kg/m  07/09/18 31.48 kg/m   Wt Readings from Last 4 Encounters:  10/09/18 180 lb (81.6 kg)  08/23/18 186 lb 8 oz (84.6 kg)  07/17/18 180 lb (81.6 kg)  07/09/18 183 lb 6.4 oz (83.2 kg)  ALL PRESCRIPTIONS WERE E-SCRIBED TO YOUR PHARMACY OF CHOICE.Radiofrequency Lesioning Radiofrequency lesioning is a procedure that is performed to relieve pain. The procedure is often used for back, neck, or arm pain. Radiofrequency lesioning involves the use of a machine that creates radio waves to make heat. During the procedure, the heat is applied to the nerve that carries the pain signal. The heat damages the nerve and interferes with the pain signal. Pain relief usually starts about 2 weeks after the procedure and lasts for 6 months to 1 year. Tell a health care provider about:  Any allergies you have.  All medicines you are taking, including vitamins, herbs, eye drops, creams, and over-the-counter medicines.  Any problems you or family members have had with anesthetic medicines.  Any blood disorders you have.  Any surgeries you have had.  Any medical conditions you have.  Whether you are pregnant or may be pregnant. What are the risks? Generally, this is a safe procedure.  However, problems may occur, including:  Pain or soreness at the injection site.  Infection at the injection  site.  Damage to nerves or blood vessels.  What happens before the procedure?  Ask your health care provider about: ? Changing or stopping your regular medicines. This is especially important if you are taking diabetes medicines or blood thinners. ? Taking medicines such as aspirin and ibuprofen. These medicines can thin your blood. Do not take these medicines before your procedure if your health care provider instructs you not to.  Follow instructions from your health care provider about eating or drinking restrictions.  Plan to have someone take you home after the procedure.  If you go home right after the procedure, plan to have someone with you for 24 hours. What happens during the procedure?  You will be given one or more of the following: ? A medicine to help you relax (sedative). ? A medicine to numb the area (local anesthetic).  You will be awake during the procedure. You will need to be able to talk with the health care provider during the procedure.  With the help of a type of X-ray (fluoroscopy), the health care provider will insert a radiofrequency needle into the area to be treated.  Next, a wire that carries the radio waves (electrode) will be put through the radiofrequency needle. An electrical pulse will be sent through the electrode to verify the correct nerve. You will feel a tingling sensation, and you may have muscle twitching.  Then, the tissue that is around the needle tip will be heated by an electric current that is passed using the radiofrequency machine. This will numb the nerves.  A bandage (dressing) will be put on the insertion area after the procedure is done. The procedure may vary among health care providers and hospitals. What happens after the procedure?  Your blood pressure, heart rate, breathing rate, and blood oxygen level will be monitored often until the medicines you were given have worn off.  Return to your normal activities as directed by  your health care provider. This information is not intended to replace advice given to you by your health care provider. Make sure you discuss any questions you have with your health care provider. Document Released: 08/02/2011 Document Revised: 05/11/2016 Document Reviewed: 01/11/2015 Elsevier Interactive Patient Education  [employer].

## 2018-10-09 NOTE — Progress Notes (Signed)
Nursing Pain Medication Assessment:  Safety precautions to be maintained throughout the outpatient stay will include: orient to surroundings, keep bed in low position, maintain call bell within reach at all times, provide assistance with transfer out of bed and ambulation.  Medication Inspection Compliance: Pill count conducted under aseptic conditions, in front of the patient. Neither the pills nor the bottle was removed from the patient's sight at any time. Once count was completed pills were immediately returned to the patient in their original bottle.  Medication: Hydrocodone/APAP Pill/Patch Count: 45.5 of 90 pills remain Pill/Patch Appearance: Markings consistent with prescribed medication Bottle Appearance: Standard pharmacy container. Clearly labeled. Filled Date: 10 / 01 / 2019 Last Medication intake:  Today

## 2018-10-09 NOTE — Progress Notes (Signed)
Patient's Name: Wanda Martinez  MRN: [id number]  Referring Provider: Einar Pheasant, MD  DOB: 01/23/1935  PCP: Einar Pheasant, MD  DOS: 10/09/2018  Note by: Vevelyn Francois NP  Service setting: Ambulatory outpatient  Specialty: Interventional Pain Management  Location: [facility] (AMB) Pain Management Facility    Patient type: Established    Primary Reason(s) for Visit: Encounter for prescription drug management. (Level of risk: moderate)  CC: Shoulder Pain (left); Back Pain (lumbar right is worse ); and Neck Pain  HPI  Wanda Martinez is a 83 y.o. year old, female patient, who comes today for a medication management evaluation. She has Hypercholesterolemia; Essential hypertension; Frequent UTI; Primary cancer of upper inner quadrant of right female breast ([place]); Anemia; Tremor; Toenail fungus; Headache; Obesity; Dysuria; Sinusitis; Parkinsons ([place]); Fatigue; Chronic kidney disease (CKD), stage II (mild); Situational depression; Incomplete bladder emptying; Mixed incontinence; HLD (hyperlipidemia); Bladder infection, chronic; Parkinson's disease ([place]); Pure hypercholesterolemia; Hernia, rectovaginal; Long term current use of opiate analgesic; Long term prescription opiate use; Opiate use; Encounter for therapeutic drug level monitoring; Encounter for chronic pain management; Chronic low back pain (Primary Source of Pain) (Bilateral) (R>L); Lumbar spondylosis; Chronic hip pain (Bilateral); Chronic neck pain; Cervical spondylosis; Chronic cervical radicular pain (Right); Chronic upper extremity pain (Right); Osteoarthritis; Diffuse myofascial pain syndrome; Neurogenic pain; Chronic upper back pain (Right); Myofascial pain syndrome (Right) (cervicothoracic); Vaginitis; Lumbar facet syndrome (Bilateral) (R>L); Malignant neoplasm of right female breast ([place]); Chronic constipation; Cervical facet hypertrophy; Cervical facet syndrome ([place]); Chronic shoulder pain (Right); Chronic pain syndrome; Chronic sacroiliac  joint pain (Left); Chronic sacroiliac joint pain (Right); Lumbosacral foraminal stenosis (L5-S1) (Right); Lumbar spinal stenosis (with neurogenic claudication) (L3-4); Chronic lower extremity pain (Secondary Area of Pain) (Right); Chronic lumbar radicular pain (S1) (Right); Osteopenia; Acute postoperative pain; Trochanteric bursitis of hip (Bilateral); Spondylosis without myelopathy or radiculopathy, lumbosacral region; Trigger point with back pain (Right); DDD (degenerative disc disease), lumbosacral; Hyponatremia; Pneumonia; Fall; GERD (gastroesophageal reflux disease); Constipation due to opioid therapy; Pharmacologic therapy; Disorder of skeletal system; and Problems influencing health status on their problem list. Her primarily concern today is the Shoulder Pain (left); Back Pain (lumbar right is worse ); and Neck Pain  Pain Assessment: Location: Left(lower back right is worse ) Shoulder(back ) Radiating: neck pain into shoulder on the left x approx 1 week.  worse today. Onset: More than a month ago Duration: Chronic pain Quality: Discomfort, Constant, Aching, Throbbing Severity: 8 /10 (subjective, self-reported pain score)  Note: Reported level is compatible with observation. Clinically the patient looks like a 3/10 A 3/10 is viewed as "Moderate" and described as significantly interfering with activities of daily living (ADL). It becomes difficult to feed, bathe, get dressed, get on and off the toilet or to perform personal hygiene functions. Difficult to get in and out of bed or a chair without assistance. Very distracting. With effort, it can be ignored when deeply involved in activities.       When using our objective Pain Scale, levels between 6 and 10/10 are said to belong in an emergency room, as it progressively worsens from a 6/10, described as severely limiting, requiring emergency care not usually available at an outpatient pain management facility. At a 6/10 level, communication becomes  difficult and requires great effort. Assistance to reach the emergency department may be required. Facial flushing and profuse sweating along with potentially dangerous increases in heart rate and blood pressure will be evident. Effect on ADL: using the Left arm makes it hurt worse.  Timing: Constant Modifying factors: medications, did not take one this morning b/c she is driving BP: (!) 701/77  HR: 74  Wanda Martinez was last scheduled for an appointment on 05/23/2018 for medication management. During today's appointment we reviewed Wanda Martinez chronic pain status, as well as her outpatient medication regimen.  She admits that she is having left neck and shoulder pain.  She admits that is the area between her neck and shoulders.  She has been using ice along with her medication which has not been effective.  She denies any recent accidents or injuries.  She denies any numbness tingling going down her arms.  She admits that she has had treatment for her shoulder in the past.  She also admits that her low back pain has returned.  She would like to have her radiofrequency repeated since it was so effective.  She admits that her pain is mostly on the right side.  The patient  reports that she does not use drugs. Her body mass index is 30.9 kg/m.  Further details on both, my assessment(s), as well as the proposed treatment plan, please see below.  Controlled Substance Pharmacotherapy Assessment REMS (Risk Evaluation and Mitigation Strategy)  Analgesic: Hydrocodone/APAP 5/325 one tablet every 8 hours MME/day: 15 mg/day.  [name], RN  10/09/2018 11:56 AM  Sign at close encounter Nursing Pain Medication Assessment:  Safety precautions to be maintained throughout the outpatient stay will include: orient to surroundings, keep bed in low position, maintain call bell within reach at all times, provide assistance with transfer out of bed and ambulation.  Medication Inspection Compliance: Pill count  conducted under aseptic conditions, in front of the patient. Neither the pills nor the bottle was removed from the patient's sight at any time. Once count was completed pills were immediately returned to the patient in their original bottle.  Medication: Hydrocodone/APAP Pill/Patch Count: 45.5 of 90 pills remain Pill/Patch Appearance: Markings consistent with prescribed medication Bottle Appearance: Standard pharmacy container. Clearly labeled. Filled Date: 10 / 01 / 2019 Last Medication intake:  Today   Pharmacokinetics: Liberation and absorption (onset of action): WNL Distribution (time to peak effect): WNL Metabolism and excretion (duration of action): WNL         Pharmacodynamics: Desired effects: Analgesia: Ms. Circle reports >50% benefit. Functional ability: Patient reports that medication allows her to accomplish basic ADLs Clinically meaningful improvement in function (CMIF): Sustained CMIF goals met Perceived effectiveness: Described as relatively effective, allowing for increase in activities of daily living (ADL) Undesirable effects: Side-effects or Adverse reactions: None reported Monitoring: [place] PMP: Online review of the past 70-monthperiod conducted. Compliant with practice rules and regulations Last UDS on record: Summary  Date Value Ref Range Status  07/02/2017 FINAL  Final    Comment:    ==================================================================== TOXASSURE SELECT 13 (MW) ==================================================================== Test                             Result       Flag       Units Drug Present and Declared for Prescription Verification   Hydrocodone                    597          EXPECTED   ng/mg creat   Dihydrocodeine                 245  EXPECTED   ng/mg creat   Norhydrocodone                 1486         EXPECTED   ng/mg creat    Sources of hydrocodone include scheduled prescription    medications. Dihydrocodeine and  norhydrocodone are expected    metabolites of hydrocodone. Dihydrocodeine is also available as a    scheduled prescription medication. ==================================================================== Test                      Result    Flag   Units      Ref Range   Creatinine              119              mg/dL      >=20 ==================================================================== Declared Medications:  The flagging and interpretation on this report are based on the  following declared medications.  Unexpected results may arise from  inaccuracies in the declared medications.  **Note: The testing scope of this panel includes these medications:  Hydrocodone (Hydrocodone-Acetaminophen)  **Note: The testing scope of this panel does not include following  reported medications:  Acetaminophen (Hydrocodone-Acetaminophen)  Amlodipine Besylate  Aspirin  Calcium Carbonate  Carbidopa (Carbidopa/Levodopa)  Citalopram  Cyclobenzaprine  Gabapentin  Glucosamine  Hydrochlorothiazide  Letrozole  Levodopa (Carbidopa/Levodopa)  Losartan (Losartan Potassium)  Lubiprostone  Magnesium Oxide  Meloxicam  Metoprolol  Multivitamin  Omega-3 Fatty Acids  Omeprazole  Pravastatin ==================================================================== For clinical consultation, please call [phone]. ====================================================================    UDS interpretation: Compliant          Medication Assessment Form: Reviewed. Patient indicates being compliant with therapy Treatment compliance: Compliant  Risk Mitigation Strategies:  Patient Counseling: Covered Patient-Prescriber Agreement (PPA): Present and active  Notification to other healthcare providers: Done  Pharmacologic Plan: No change in therapy, at this time.             Laboratory Chemistry  Inflammation Markers (CRP: Acute Phase) (ESR: Chronic Phase) Lab Results  Component Value Date   CRP 3  07/17/2018   ESRSEDRATE 27 07/17/2018                         Rheumatology Markers No results found for: RF, ANA, LABURIC, URICUR, LYMEIGGIGMAB, LYMEABIGMQN, HLAB27                      Renal Function Markers Lab Results  Component Value Date   BUN 18 09/25/2018   CREATININE 1.03 09/25/2018   GFRAA >60 10/16/2017   GFRNONAA 58 (L) 10/16/2017                             Hepatic Function Markers Lab Results  Component Value Date   AST 17 07/09/2018   ALT 25 07/09/2018   ALBUMIN 4.0 07/09/2018   ALKPHOS 54 07/09/2018                        Electrolytes Lab Results  Component Value Date   NA 135 09/25/2018   K 4.3 09/25/2018   CL 99 09/25/2018   CALCIUM 9.7 09/25/2018   MG 2.2 07/17/2018                        Neuropathy Markers Lab Results  Component Value  Date   VITAMINB12 1,235 07/17/2018                        CNS Tests No results found for: COLORCSF, APPEARCSF, RBCCOUNTCSF, WBCCSF, POLYSCSF, LYMPHSCSF, EOSCSF, PROTEINCSF, GLUCCSF, JCVIRUS, CSFOLI, IGGCSF                      Bone Pathology Markers Lab Results  Component Value Date   25OHVITD1 42 07/17/2018   25OHVITD2 <1.0 07/17/2018   25OHVITD3 42 07/17/2018                         Coagulation Parameters Lab Results  Component Value Date   PLT 432.0 (H) 07/09/2018                        Cardiovascular Markers Lab Results  Component Value Date   TROPONINI < 0.02 10/09/2014   HGB 11.8 (L) 07/09/2018   HCT 34.9 (L) 07/09/2018                         CA Markers Lab Results  Component Value Date   LABCA2 24.5 08/07/2013                        Note: Lab results reviewed.  Recent Diagnostic Imaging Results  DG Chest 2 View CLINICAL DATA:  f/u abnormal cxr - compare to previous  EXAM: CHEST - 2 VIEW  COMPARISON:  10/28/2013  FINDINGS: Coarse bronchovascular markings bilaterally. No confluent airspace disease or overt edema.  Heart size upper limits normal. Aortic  Atherosclerosis (ICD10-170.0).  No effusion.  No pneumothorax.  Spondylitic change in the upper lumbar spine.  IMPRESSION: Coarse chronic appearing bronchovascular markings. No definite acute findings.  Electronically Signed   By: Lucrezia Europe M.D.   On: 08/24/2018 10:39  Complexity Note: Imaging results reviewed. Results shared with Ms. Ermalinda Barrios, using Layman's terms.                         Meds   Current Outpatient Medications:  .  acyclovir ointment (ZOVIRAX) 5 %, Apply 1 application topically 4 (four) times daily., Disp: 15 g, Rfl: 0 .  amLODipine (NORVASC) 2.5 MG tablet, TAKE 1 TABLET BY MOUTH ONCE DAILY., Disp: 30 tablet, Rfl: 5 .  aspirin 81 MG EC tablet, Take 81 mg by mouth daily as needed.  , Disp: , Rfl:  .  Calcium Carbonate (CALCIUM 600) 1500 MG TABS, Take 1 tablet by mouth daily.  , Disp: , Rfl:  .  citalopram (CELEXA) 20 MG tablet, TAKE 1 & 1/2 TABLETS BY MOUTH ONCE DAILY, Disp: , Rfl:  .  cyclobenzaprine (FLEXERIL) 10 MG tablet, TAKE ONE TABLET BY MOUTH AT BEDTIME., Disp: 30 tablet, Rfl: 5 .  gabapentin (NEURONTIN) 100 MG capsule, Take 200 mg by mouth 4 (four) times daily. , Disp: , Rfl: 1 .  GLUCOSAMINE SULFATE PO, Take by mouth daily. , Disp: , Rfl:  .  hydrochlorothiazide (HYDRODIURIL) 25 MG tablet, Take 0.5 tablets by mouth daily., Disp: , Rfl: 10 .  [START ON 11/22/2018] HYDROcodone-acetaminophen (NORCO/VICODIN) 5-325 MG tablet, Take 1 tablet by mouth every 8 (eight) hours as needed for severe pain., Disp: 90 tablet, Rfl: 0 .  losartan (COZAAR) 100 MG tablet, TAKE 1 TABLET BY MOUTH ONCE DAILY., Disp: 30 tablet, Rfl:  5 .  lubiprostone (AMITIZA) 24 MCG capsule, TAKE 1 CAPSULE BY MOUTH ONCE DAILY WITH BREAKFAST., Disp: , Rfl:  .  magnesium oxide (MAG-OX) 400 (241.3 Mg) MG tablet, TAKE 1 TABLET BY MOUTH ONCE DAILY., Disp: 30 tablet, Rfl: 5 .  meloxicam (MOBIC) 7.5 MG tablet, TAKE 1 TABLET BY MOUTH ONCE DAILY., Disp: 30 tablet, Rfl: 5 .  metoprolol succinate (TOPROL-XL)  25 MG 24 hr tablet, TAKE 1 TABLET BY MOUTH TWICE DAILY, Disp: 60 tablet, Rfl: 10 .  Multiple Vitamin (MULTIVITAMIN) tablet, Take 1 tablet by mouth daily., Disp: , Rfl:  .  Omega-3 1000 MG CAPS, Take by mouth daily. , Disp: , Rfl:  .  omeprazole (PRILOSEC) 20 MG capsule, TAKE 1 CAPSULE BY MOUTH TWICE DAILY FOR REFLUX., Disp: 60 capsule, Rfl: 1 .  pantoprazole (PROTONIX) 40 MG tablet, TAKE 1 TABLET BY MOUTH ONCE DAILY., Disp: 30 tablet, Rfl: 5 .  pravastatin (PRAVACHOL) 20 MG tablet, TAKE 1 TABLET BY MOUTH ONCE DAILY., Disp: 30 tablet, Rfl: 5 .  [START ON 12/22/2018] HYDROcodone-acetaminophen (NORCO/VICODIN) 5-325 MG tablet, Take 1 tablet by mouth every 8 (eight) hours as needed for severe pain., Disp: 90 tablet, Rfl: 0 .  [START ON 10/23/2018] HYDROcodone-acetaminophen (NORCO/VICODIN) 5-325 MG tablet, Take 1 tablet by mouth every 8 (eight) hours as needed for severe pain., Disp: 90 tablet, Rfl: 0 .  letrozole (FEMARA) 2.5 MG tablet, Take 2.5 mg by mouth daily., Disp: , Rfl:   ROS  Constitutional: Denies any fever or chills Gastrointestinal: No reported hemesis, hematochezia, vomiting, or acute GI distress Musculoskeletal: Denies any acute onset joint swelling, redness, loss of ROM, or weakness Neurological: No reported episodes of acute onset apraxia, aphasia, dysarthria, agnosia, amnesia, paralysis, loss of coordination, or loss of consciousness  Allergies  Ms. Nored is allergic to sulfa antibiotics and vesicare [solifenacin].  [place]  Drug: Ms. Dejarnette  reports that she does not use drugs. Alcohol:  reports that she does not drink alcohol. Tobacco:  reports that she has never smoked. She has never used smokeless tobacco. Medical:  has a past medical history of Anemia, Arm pain (07/26/2015), Arthritis, Arthritis, degenerative (03/26/2014), Back pain (11/01/2013), Bladder infection (06/2018), Breast cancer ([place]), Breast cancer ([place]), CHEST PAIN (04/29/2010), Chronic cystitis, Cystocele (02/22/2013),  Cystocele, midline (08/19/2013), Degeneration of intervertebral disc of lumbosacral region (03/26/2014), DYSPNEA (04/29/2010), Enthesopathy of hip (03/26/2014), GERD (gastroesophageal reflux disease), Hiatal hernia, HTN (hypertension), Hypokalemia (06/2018), Hyponatremia (06/2018), LBP (low back pain) (03/26/2014), Neck pain (11/01/2013), Parkinson disease ([place]), Pneumonia (06/2018), Sinusitis (02/07/2015), Skin lesions (07/12/2014), and Urinary incontinence. Surgical: Ms. Burry  has a past surgical history that includes Breast biopsy (2013); Tonsillectomy and adenoidectomy (79); Vesicovaginal fistula closure w/ TAH (1983); Breast surgery (1986); Breast enhancement surgery (1987); Breast implant removal; Breast implant removal (Right, 08/29/2012); Mastectomy (08/2012); Abdominal hysterectomy; and Colonoscopy with propofol (N/A, 09/13/2016). Family: family history includes Colon polyps in her father; Diabetes in her father; Parkinson's disease in her mother; Stroke in her father and mother.  Constitutional Exam  General appearance: Well nourished, well developed, and well hydrated. In no apparent acute distress Vitals:   10/09/18 1142  BP: (!) 157/82  Pulse: 74  Resp: 16  Temp: 97.9 F (36.6 C)  TempSrc: Oral  SpO2: 100%  Weight: 180 lb (81.6 kg)  Height: '5\' 4"'$  (1.626 m)  Psych/Mental status: Alert, oriented x 3 (person, place, & time)       Eyes: PERLA Respiratory: No evidence of acute respiratory distress  Cervical Spine Area Exam  Skin & Axial Inspection: No masses, redness, edema, swelling, or associated skin lesions Alignment: Asymmetric Functional ROM: Guarding      Stability: No instability detected Muscle Tone/Strength: Guarding observed Sensory (Neurological): Unimpaired Palpation: Trigger Point            tender  Upper Extremity (UE) Exam    Side: Right upper extremity  Side: Left upper extremity  Skin & Extremity Inspection: Skin color, temperature, and hair growth are WNL. No peripheral  edema or cyanosis. No masses, redness, swelling, asymmetry, or associated skin lesions. No contractures.  Skin & Extremity Inspection: Skin color, temperature, and hair growth are WNL. No peripheral edema or cyanosis. No masses, redness, swelling, asymmetry, or associated skin lesions. No contractures.  Functional ROM: Unrestricted ROM          Functional ROM: Unrestricted ROM          Muscle Tone/Strength: Functionally intact. No obvious neuro-muscular anomalies detected.  Muscle Tone/Strength: Functionally intact. No obvious neuro-muscular anomalies detected.  Sensory (Neurological): Unimpaired          Sensory (Neurological): Unimpaired          Palpation: No palpable anomalies              Palpation: No palpable anomalies              Provocative Test(s):  Phalen's test: deferred Tinel's test: deferred Apley's scratch test (touch opposite shoulder):  Action 1 (Across chest): deferred Action 2 (Overhead): deferred Action 3 (LB reach): deferred   Provocative Test(s):  Phalen's test: deferred Tinel's test: deferred Apley's scratch test (touch opposite shoulder):  Action 1 (Across chest): deferred Action 2 (Overhead): deferred Action 3 (LB reach): deferred    Lumbar Spine Area Exam  Skin & Axial Inspection: No masses, redness, or swelling Alignment: Symmetrical Functional ROM: Unrestricted ROM       Stability: No instability detected Muscle Tone/Strength: Functionally intact. No obvious neuro-muscular anomalies detected. Sensory (Neurological): Unimpaired Palpation: No palpable anomalies         Gait & Posture Assessment  Ambulation: Unassisted Gait: Relatively normal for age and body habitus Posture: WNL   Lower Extremity Exam    Side: Right lower extremity  Side: Left lower extremity  Stability: No instability observed          Stability: No instability observed          Skin & Extremity Inspection: Skin color, temperature, and hair growth are WNL. No peripheral edema or  cyanosis. No masses, redness, swelling, asymmetry, or associated skin lesions. No contractures.  Skin & Extremity Inspection: Skin color, temperature, and hair growth are WNL. No peripheral edema or cyanosis. No masses, redness, swelling, asymmetry, or associated skin lesions. No contractures.  Functional ROM: Unrestricted ROM                  Functional ROM: Unrestricted ROM                  Muscle Tone/Strength: Functionally intact. No obvious neuro-muscular anomalies detected.  Muscle Tone/Strength: Functionally intact. No obvious neuro-muscular anomalies detected.  Sensory (Neurological): Unimpaired  Sensory (Neurological): Unimpaired  Palpation: No palpable anomalies  Palpation: No palpable anomalies   Assessment  Primary Diagnosis & Pertinent Problem List: The primary encounter diagnosis was Lumbar spondylosis. Diagnoses of Cervical spondylosis, Diffuse myofascial pain syndrome, Chronic pain syndrome, Chronic low back pain (Primary Source of Pain) (Bilateral) (R>L), Spondylosis without myelopathy or radiculopathy, lumbosacral region, Lumbar facet syndrome (Bilateral) (R>L), and Chronic  right sacroiliac joint pain were also pertinent to this visit.  Status Diagnosis  Persistent Persistent Worsening 1. Lumbar spondylosis   2. Cervical spondylosis   3. Diffuse myofascial pain syndrome   4. Chronic pain syndrome   5. Chronic low back pain (Primary Source of Pain) (Bilateral) (R>L)   6. Spondylosis without myelopathy or radiculopathy, lumbosacral region   7. Lumbar facet syndrome (Bilateral) (R>L)   8. Chronic right sacroiliac joint pain     Problems updated and reviewed during this visit: Problem  Chronic shoulder pain (Right)  Obesity  Situational Depression   Plan of Care  Pharmacotherapy (Medications Ordered): Meds ordered this encounter  Medications  . HYDROcodone-acetaminophen (NORCO/VICODIN) 5-325 MG tablet    Sig: Take 1 tablet by mouth every 8 (eight) hours as needed for  severe pain.    Dispense:  90 tablet    Refill:  0    Do not add this medication to the electronic "Automatic Refill" notification system. Patient may have prescription filled one day early if pharmacy is closed on scheduled refill date.    Order Specific Question:   Supervising Provider    Answer:   Milinda Pointer [phone]  . HYDROcodone-acetaminophen (NORCO/VICODIN) 5-325 MG tablet    Sig: Take 1 tablet by mouth every 8 (eight) hours as needed for severe pain.    Dispense:  90 tablet    Refill:  0    Do not add this medication to the electronic "Automatic Refill" notification system. Patient may have prescription filled one day early if pharmacy is closed on scheduled refill date.    Order Specific Question:   Supervising Provider    Answer:   Milinda Pointer [phone]  . HYDROcodone-acetaminophen (NORCO/VICODIN) 5-325 MG tablet    Sig: Take 1 tablet by mouth every 8 (eight) hours as needed for severe pain.    Dispense:  90 tablet    Refill:  0    Do not add this medication to the electronic "Automatic Refill" notification system. Patient may have prescription filled one day early if pharmacy is closed on scheduled refill date.    Order Specific Question:   Supervising Provider    Answer:   Milinda Pointer [phone]  . orphenadrine (NORFLEX) injection 30 mg  . ketorolac (TORADOL) 30 MG/ML injection 30 mg   New Prescriptions   No medications on file   Medications administered today: We administered orphenadrine and ketorolac. Lab-work, procedure(s), and/or referral(s): Orders Placed This Encounter  Procedures  . LUMBAR FACET(MEDIAL BRANCH NERVE BLOCK) MBNB   Imaging and/or referral(s): None  Interventional management options: Planned, scheduled, and/or pending:   Palliative right-sided lumbar facet block Diagnostic right-sided sacroiliac joint injection   Considering:   Diagnosticright-sided lumbar facet block  Palliative right-sided lumbar facet radiofrequency  ablation  PalliativeRight suprascapular muscle trigger point injection  Diagnosticbilateral cervical facet block  Possiblebilateral cervical facet radiofrequency ablation  Diagnostic right-sided cervical epidural steroid injection  Diagnosticintra-articular hip joint injection  Possible hip joint radiofrequency ablation   Palliative PRN treatment(s):   Diagnostic right-sided lumbar facet block  Palliative right-sided lumbar facet RFA #3(last done on 06/04/18) Palliative Right suprascapular muscle trigger point injection  Diagnosticbilateral cervical facet block  Diagnosticright-sided cervical epidural steroid injection Diagnosticintra-articular hip joint injection Palliative right-sided L3-4 interlaminar LESI#3  Palliative right-sided L5 transforaminal LESI #3     Provider-requested follow-up: Return in about 3 months (around 01/09/2019) for MedMgmt, in addition, Procedure(w/Sedation), w/ Dr. Dossie Arbour.  Future Appointments  Date Time Provider [place]  10/24/2018  9:45 AM Milinda Pointer, MD [facility] None  01/08/2019 10:30 AM Vevelyn Francois, NP [facility] None  01/13/2019  2:00 PM Einar Pheasant, MD [facility]  04/08/2019 11:15 AM Lloyd Huger, MD [facility] None  07/10/2019  9:30 AM Einar Pheasant, MD [facility]  07/10/2019 10:00 AM O'Brien-Blaney, Bryson Corona, LPN [facility]   Primary Care Physician: Einar Pheasant, MD Location: [facility] Note by: Vevelyn Francois NP Date: 10/09/2018; Time: 4:01 PM  Pain Score Disclaimer: We use the NRS-11 scale. This is a self-reported, subjective measurement of pain severity with only modest accuracy. It is used primarily to identify changes within a particular patient. It must be understood that outpatient pain scales are significantly less accurate that those used for research, where they can be applied under ideal controlled circumstances with minimal exposure to variables. In  reality, the score is likely to be a combination of pain intensity and pain affect, where pain affect describes the degree of emotional arousal or changes in action readiness caused by the sensory experience of pain. Factors such as social and work situation, setting, emotional state, anxiety levels, expectation, and prior pain experience may influence pain perception and show large inter-individual differences that may also be affected by time variables.  Patient instructions provided during this appointment: Patient Instructions   ____________________________________________________________________________________________  Medication Rules  Applies to: All patients receiving prescriptions (written or electronic).  Pharmacy of record: Pharmacy where electronic prescriptions will be sent. If written prescriptions are taken to a different pharmacy, please inform the nursing staff. The pharmacy listed in the electronic medical record should be the one where you would like electronic prescriptions to be sent.  Prescription refills: Only during scheduled appointments. Applies to both, written and electronic prescriptions.  NOTE: The following applies primarily to controlled substances (Opioid* Pain Medications).   Patient's responsibilities: 1. Pain Pills: Bring all pain pills to every appointment (except for procedure appointments). 2. Pill Bottles: Bring pills in original pharmacy bottle. Always bring newest bottle. Bring bottle, even if empty. 3. Medication refills: You are responsible for knowing and keeping track of what medications you need refilled. The day before your appointment, write a list of all prescriptions that need to be refilled. Bring that list to your appointment and give it to the admitting nurse. Prescriptions will be written only during appointments. If you forget a medication, it will not be "Called in", "Faxed", or "electronically sent". You will need to get another  appointment to get these prescribed. 4. Prescription Accuracy: You are responsible for carefully inspecting your prescriptions before leaving our office. Have the discharge nurse carefully go over each prescription with you, before taking them home. Make sure that your name is accurately spelled, that your address is correct. Check the name and dose of your medication to make sure it is accurate. Check the number of pills, and the written instructions to make sure they are clear and accurate. Make sure that you are given enough medication to last until your next medication refill appointment. 5. Taking Medication: Take medication as prescribed. Never take more pills than instructed. Never take medication more frequently than prescribed. Taking less pills or less frequently is permitted and encouraged, when it comes to controlled substances (written prescriptions).  6. Inform other Doctors: Always inform, all of your healthcare providers, of all the medications you take. 7. Pain Medication from other Providers: You are not allowed to accept any additional pain medication from any other Doctor or Healthcare provider.  There are two exceptions to this rule. (see below) In the event that you require additional pain medication, you are responsible for notifying us, as stated below. 8. Medication Agreement: You are responsible for carefully reading and following our Medication Agreement. This must be signed before receiving any prescriptions from our practice. Safely store a copy of your signed Agreement. Violations to the Agreement will result in no further prescriptions. (Additional copies of our Medication Agreement are available upon request.) 9. Laws, Rules, & Regulations: All patients are expected to follow all Federal and [employer], Rules, [employer]. Ignorance of the Laws does not constitute a valid excuse. The use of any illegal substances is prohibited. 10. Adopted CDC guidelines &  recommendations: Target dosing levels will be at or below 60 MME/day. Use of benzodiazepines** is not recommended.  Exceptions: There are only two exceptions to the rule of not receiving pain medications from other Healthcare Providers. 1. Exception #1 (Emergencies): In the event of an emergency (i.e.: accident requiring emergency care), you are allowed to receive additional pain medication. However, you are responsible for: As soon as you are able, call our office [phone], at any time of the day or night, and leave a message stating your name, the date and nature of the emergency, and the name and dose of the medication prescribed. In the event that your call is answered by a member of our staff, make sure to document and save the date, time, and the name of the person that took your information.  2. Exception #2 (Planned Surgery): In the event that you are scheduled by another doctor or dentist to have any type of surgery or procedure, you are allowed (for a period no longer than 30 days), to receive additional pain medication, for the acute post-op pain. However, in this case, you are responsible for picking up a copy of our "Post-op Pain Management for Surgeons" handout, and giving it to your surgeon or dentist. This document is available at our office, and does not require an appointment to obtain it. Simply go to our office during business hours (Monday-Thursday from 8:00 AM to 4:00 PM) (Friday 8:00 AM to 12:00 Noon) or if you have a scheduled appointment with [place], prior to your surgery, and ask for it by name. In addition, you will need to provide [place] with your name, name of your surgeon, type of surgery, and date of procedure or surgery.  *Opioid medications include: morphine, codeine, oxycodone, oxymorphone, hydrocodone, hydromorphone, meperidine, tramadol, tapentadol, buprenorphine, fentanyl, methadone. **Benzodiazepine medications include: diazepam (Valium), alprazolam (Xanax), clonazepam  (Klonopine), lorazepam (Ativan), clorazepate (Tranxene), chlordiazepoxide (Librium), estazolam (Prosom), oxazepam (Serax), temazepam (Restoril), triazolam (Halcion) (Last updated: 02/14/2018) ____________________________________________________________________________________________    BMI Assessment: Estimated body mass index is 30.9 kg/m as calculated from the following:   Height as of this encounter: '5\' 4"'$  (1.626 m).   Weight as of this encounter: 180 lb (81.6 kg).  BMI interpretation table: BMI level Category Range association with higher incidence of chronic pain  <18 kg/m2 Underweight   18.5-24.9 kg/m2 Ideal body weight   25-29.9 kg/m2 Overweight Increased incidence by 20%  30-34.9 kg/m2 Obese (Class I) Increased incidence by 68%  35-39.9 kg/m2 Severe obesity (Class II) Increased incidence by 136%  >40 kg/m2 Extreme obesity (Class III) Increased incidence by 254%   Patient's current BMI Ideal Body weight  Body mass index is 30.9 kg/m. Ideal body weight: 54.7 kg (120 lb 9.5 oz) Adjusted ideal body weight: 65.5 kg (144 lb  5.7 oz)   BMI Readings from Last 4 Encounters:  10/09/18 30.90 kg/m  08/23/18 32.01 kg/m  07/17/18 30.90 kg/m  07/09/18 31.48 kg/m   Wt Readings from Last 4 Encounters:  10/09/18 180 lb (81.6 kg)  08/23/18 186 lb 8 oz (84.6 kg)  07/17/18 180 lb (81.6 kg)  07/09/18 183 lb 6.4 oz (83.2 kg)  ALL PRESCRIPTIONS WERE E-SCRIBED TO YOUR PHARMACY OF CHOICE.Radiofrequency Lesioning Radiofrequency lesioning is a procedure that is performed to relieve pain. The procedure is often used for back, neck, or arm pain. Radiofrequency lesioning involves the use of a machine that creates radio waves to make heat. During the procedure, the heat is applied to the nerve that carries the pain signal. The heat damages the nerve and interferes with the pain signal. Pain relief usually starts about 2 weeks after the procedure and lasts for 6 months to 1 year. Tell a health care  provider about:  Any allergies you have.  All medicines you are taking, including vitamins, herbs, eye drops, creams, and over-the-counter medicines.  Any problems you or family members have had with anesthetic medicines.  Any blood disorders you have.  Any surgeries you have had.  Any medical conditions you have.  Whether you are pregnant or may be pregnant. What are the risks? Generally, this is a safe procedure. However, problems may occur, including:  Pain or soreness at the injection site.  Infection at the injection site.  Damage to nerves or blood vessels.  What happens before the procedure?  Ask your health care provider about: ? Changing or stopping your regular medicines. This is especially important if you are taking diabetes medicines or blood thinners. ? Taking medicines such as aspirin and ibuprofen. These medicines can thin your blood. Do not take these medicines before your procedure if your health care provider instructs you not to.  Follow instructions from your health care provider about eating or drinking restrictions.  Plan to have someone take you home after the procedure.  If you go home right after the procedure, plan to have someone with you for 24 hours. What happens during the procedure?  You will be given one or more of the following: ? A medicine to help you relax (sedative). ? A medicine to numb the area (local anesthetic).  You will be awake during the procedure. You will need to be able to talk with the health care provider during the procedure.  With the help of a type of X-ray (fluoroscopy), the health care provider will insert a radiofrequency needle into the area to be treated.  Next, a wire that carries the radio waves (electrode) will be put through the radiofrequency needle. An electrical pulse will be sent through the electrode to verify the correct nerve. You will feel a tingling sensation, and you may have muscle  twitching.  Then, the tissue that is around the needle tip will be heated by an electric current that is passed using the radiofrequency machine. This will numb the nerves.  A bandage (dressing) will be put on the insertion area after the procedure is done. The procedure may vary among health care providers and hospitals. What happens after the procedure?  Your blood pressure, heart rate, breathing rate, and blood oxygen level will be monitored often until the medicines you were given have worn off.  Return to your normal activities as directed by your health care provider. This information is not intended to replace advice given to you by your health  care provider. Make sure you discuss any questions you have with your health care provider. Document Released: 08/02/2011 Document Revised: 05/11/2016 Document Reviewed: 01/11/2015 Elsevier Interactive Patient Education  [employer].

## 2018-10-10 DIAGNOSIS — M6281 Muscle weakness (generalized): Secondary | ICD-10-CM | POA: Diagnosis not present

## 2018-10-10 DIAGNOSIS — M545 Low back pain: Secondary | ICD-10-CM | POA: Diagnosis not present

## 2018-10-15 ENCOUNTER — Other Ambulatory Visit: Payer: Self-pay | Admitting: Pain Medicine

## 2018-10-16 DIAGNOSIS — M545 Low back pain: Secondary | ICD-10-CM | POA: Diagnosis not present

## 2018-10-16 DIAGNOSIS — M6281 Muscle weakness (generalized): Secondary | ICD-10-CM | POA: Diagnosis not present

## 2018-10-17 ENCOUNTER — Ambulatory Visit: Payer: Medicare Other | Attending: Pain Medicine | Admitting: Pain Medicine

## 2018-10-17 ENCOUNTER — Encounter: Payer: Self-pay | Admitting: Pain Medicine

## 2018-10-17 ENCOUNTER — Other Ambulatory Visit: Payer: Self-pay

## 2018-10-17 VITALS — BP 153/80 | HR 68 | Temp 98.2°F | Resp 16 | Ht 64.0 in | Wt 180.0 lb

## 2018-10-17 DIAGNOSIS — M47818 Spondylosis without myelopathy or radiculopathy, sacral and sacrococcygeal region: Secondary | ICD-10-CM | POA: Diagnosis not present

## 2018-10-17 DIAGNOSIS — N820 Vesicovaginal fistula: Secondary | ICD-10-CM | POA: Diagnosis not present

## 2018-10-17 DIAGNOSIS — M546 Pain in thoracic spine: Secondary | ICD-10-CM | POA: Insufficient documentation

## 2018-10-17 DIAGNOSIS — M79601 Pain in right arm: Secondary | ICD-10-CM

## 2018-10-17 DIAGNOSIS — Z882 Allergy status to sulfonamides status: Secondary | ICD-10-CM | POA: Diagnosis not present

## 2018-10-17 DIAGNOSIS — M9904 Segmental and somatic dysfunction of sacral region: Secondary | ICD-10-CM | POA: Diagnosis not present

## 2018-10-17 DIAGNOSIS — M25511 Pain in right shoulder: Secondary | ICD-10-CM | POA: Insufficient documentation

## 2018-10-17 DIAGNOSIS — Z9882 Breast implant status: Secondary | ICD-10-CM | POA: Diagnosis not present

## 2018-10-17 DIAGNOSIS — G8929 Other chronic pain: Secondary | ICD-10-CM | POA: Diagnosis not present

## 2018-10-17 DIAGNOSIS — M533 Sacrococcygeal disorders, not elsewhere classified: Secondary | ICD-10-CM | POA: Diagnosis not present

## 2018-10-17 DIAGNOSIS — Z9010 Acquired absence of unspecified breast and nipple: Secondary | ICD-10-CM | POA: Diagnosis not present

## 2018-10-17 DIAGNOSIS — M549 Dorsalgia, unspecified: Secondary | ICD-10-CM

## 2018-10-17 DIAGNOSIS — M5388 Other specified dorsopathies, sacral and sacrococcygeal region: Secondary | ICD-10-CM | POA: Insufficient documentation

## 2018-10-17 DIAGNOSIS — M25512 Pain in left shoulder: Secondary | ICD-10-CM | POA: Diagnosis not present

## 2018-10-17 DIAGNOSIS — M461 Sacroiliitis, not elsewhere classified: Secondary | ICD-10-CM | POA: Insufficient documentation

## 2018-10-17 DIAGNOSIS — Z9889 Other specified postprocedural states: Secondary | ICD-10-CM | POA: Diagnosis not present

## 2018-10-17 MED ORDER — METHYLPREDNISOLONE ACETATE 80 MG/ML IJ SUSP
INTRAMUSCULAR | Status: AC
  Filled 2018-10-17: qty 1

## 2018-10-17 MED ORDER — METHYLPREDNISOLONE ACETATE 80 MG/ML IJ SUSP
80.0000 mg | Freq: Once | INTRAMUSCULAR | Status: AC
  Administered 2018-10-17: 80 mg via INTRA_ARTICULAR

## 2018-10-17 MED ORDER — ROPIVACAINE HCL 2 MG/ML IJ SOLN
INTRAMUSCULAR | Status: AC
  Filled 2018-10-17: qty 10

## 2018-10-17 MED ORDER — ROPIVACAINE HCL 2 MG/ML IJ SOLN
9.0000 mL | Freq: Once | INTRAMUSCULAR | Status: AC
  Administered 2018-10-17: 10 mL

## 2018-10-17 MED ORDER — LIDOCAINE HCL 2 % IJ SOLN: 20.0000 mL | Freq: Once | INTRAMUSCULAR | Status: DC

## 2018-10-17 NOTE — Progress Notes (Signed)
Safety precautions to be maintained throughout the outpatient stay will include: orient to surroundings, keep bed in low position, maintain call bell within reach at all times, provide assistance with transfer out of bed and ambulation.  

## 2018-10-17 NOTE — Patient Instructions (Addendum)
____________________________________________________________________________________________  Post-Procedure Discharge Instructions  Instructions:  Apply ice: Fill a plastic sandwich bag with crushed ice. Cover it with a small towel and apply to injection site. Apply for 15 minutes then remove x 15 minutes. Repeat sequence on day of procedure, until you go to bed. The purpose is to minimize swelling and discomfort after procedure.  Apply heat: Apply heat to procedure site starting the day following the procedure. The purpose is to treat any soreness and discomfort from the procedure.  Food intake: Start with clear liquids (like water) and advance to regular food, as tolerated.   Physical activities: Keep activities to a minimum for the first 8 hours after the procedure.   Driving: If you have received any sedation, you are not allowed to drive for 24 hours after your procedure.  Blood thinner: Restart your blood thinner 6 hours after your procedure. (Only for those taking blood thinners)  Insulin: As soon as you can eat, you may resume your normal dosing schedule. (Only for those taking insulin)  Infection prevention: Keep procedure site clean and dry.  Post-procedure Pain Diary: Extremely important that this be done correctly and accurately. Recorded information will be used to determine the next step in treatment.  Pain evaluated is that of treated area only. Do not include pain from an untreated area.  Complete every hour, on the hour, for the initial 8 hours. Set an alarm to help you do this part accurately.  Do not go to sleep and have it completed later. It will not be accurate.  Follow-up appointment: Keep your follow-up appointment after the procedure. Usually 2 weeks for most procedures. (6 weeks in the case of radiofrequency.) Bring you pain diary.   Expect:  From numbing medicine (AKA: Local Anesthetics): Numbness or decrease in pain.  Onset: Full effect within 15  minutes of injected.  Duration: It will depend on the type of local anesthetic used. On the average, 1 to 8 hours.   From steroids: Decrease in swelling or inflammation. Once inflammation is improved, relief of the pain will follow.  Onset of benefits: Depends on the amount of swelling present. The more swelling, the longer it will take for the benefits to be seen. In some cases, up to 10 days.  Duration: Steroids will stay in the system x 2 weeks. Duration of benefits will depend on multiple posibilities including persistent irritating factors.  Occasional side-effects: Facial flushing, cramps (if present, drink Gatorade and take over-the-counter Magnesium 450-500 mg once to twice a day).  From procedure: Some discomfort is to be expected once the numbing medicine wears off. This should be minimal if ice and heat are applied as instructed.  Call if:  You experience numbness and weakness that gets worse with time, as opposed to wearing off.  New onset bowel or bladder incontinence. (This applies to Spinal procedures only)  Emergency Numbers:  [place] business hours (Monday - Thursday, 8:00 AM - 4:00 PM) (Friday, 9:00 AM - 12:00 Noon): [phone]  After hours: [phone] ____________________________________________________________________________________________   ____________________________________________________________________________________________  Muscle Spasms & Cramps  Cause:  The most common cause of muscle spasms and cramps is vitamin and/or electrolyte (calcium, potassium, sodium, etc.) deficiencies.  Possible triggers: Sweating - causes loss of electrolytes thru the skin. Steroids - causes loss of electrolytes thru the urine.  Treatment: 1. Gatorade (or any other electrolyte-replenishing drink) - Take 1, 8 oz glass with each meal (3 times a day). 2. OTC (over-the-counter) Magnesium 400 to 500 mg -  Take 1 tablet twice a day (one with breakfast and one  before bedtime). If you have kidney problems, talk to your primary care physician before taking any Magnesium. 3. Tonic Water with quinine - Take 1, 8 oz glass before bedtime.   ____________________________________________________________________________________________   ____________________________________________________________________________________________  Preparing for Procedure with Sedation  Instructions: . Oral Intake: Do not eat or drink anything for at least 8 hours prior to your procedure. . Transportation: Public transportation is not allowed. Bring an adult driver. The driver must be physically present in our waiting room before any procedure can be started. [place] Physical Assistance: Bring an adult physically capable of assisting you, in the event you need help. This adult should keep you company at home for at least 6 hours after the procedure. . Blood Pressure Medicine: Take your blood pressure medicine with a sip of water the morning of the procedure. . Blood thinners: Notify our staff if you are taking any blood thinners. Depending on which one you take, there will be specific instructions on how and when to stop it. . Diabetics on insulin: Notify the staff so that you can be scheduled 1st case in the morning. If your diabetes requires high dose insulin, take only  of your normal insulin dose the morning of the procedure and notify the staff that you have done so. . Preventing infections: Shower with an antibacterial soap the morning of your procedure. . Build-up your immune system: Take 1000 mg of Vitamin C with every meal (3 times a day) the day prior to your procedure. [place] Antibiotics: Inform the staff if you have a condition or reason that requires you to take antibiotics before dental procedures. . Pregnancy: If you are pregnant, call and cancel the procedure. . Sickness: If you have a cold, fever, or any active infections, call and cancel the procedure. . Arrival: You  must be in the facility at least 30 minutes prior to your scheduled procedure. . Children: Do not bring children with you. . Dress appropriately: Bring dark clothing that you would not mind if they get stained. . Valuables: Do not bring any jewelry or valuables.  Procedure appointments are reserved for interventional treatments only. [place] No Prescription Refills. . No medication changes will be discussed during procedure appointments. . No disability issues will be discussed.  Reasons to call and reschedule or cancel your procedure: (Following these recommendations will minimize the risk of a serious complication.) . Surgeries: Avoid having procedures within 2 weeks of any surgery. (Avoid for 2 weeks before or after any surgery). . Flu Shots: Avoid having procedures within 2 weeks of a flu shots or . (Avoid for 2 weeks before or after immunizations). . Barium: Avoid having a procedure within 7-10 days after having had a radiological study involving the use of radiological contrast. (Myelograms, Barium swallow or enema study). . Heart attacks: Avoid any elective procedures or surgeries for the initial 6 months after a "Myocardial Infarction" (Heart Attack). . Blood thinners: It is imperative that you stop these medications before procedures. Let us know if you if you take any blood thinner.  . Infection: Avoid procedures during or within two weeks of an infection (including chest colds or gastrointestinal problems). Symptoms associated with infections include: Localized redness, fever, chills, night sweats or profuse sweating, burning sensation when voiding, cough, congestion, stuffiness, runny nose, sore throat, diarrhea, nausea, vomiting, cold or Flu symptoms, recent or current infections. It is specially important if the infection is over the area that we  intend to treat. [place] Heart and lung problems: Symptoms that may suggest an active cardiopulmonary problem include: cough, chest pain, breathing  difficulties or shortness of breath, dizziness, ankle swelling, uncontrolled high or unusually low blood pressure, and/or palpitations. If you are experiencing any of these symptoms, cancel your procedure and contact your primary care physician for an evaluation.  Remember:  Regular Business hours are:  Monday to Thursday 8:00 AM to 4:00 PM  Provider's Schedule: Milinda Pointer, MD:  Procedure days: Tuesday and Thursday 7:30 AM to 4:00 PM  Gillis Santa, MD:  Procedure days: Monday and Wednesday 7:30 AM to 4:00 PM ____________________________________________________________________________________________

## 2018-10-17 NOTE — Progress Notes (Signed)
Patient's Name: Wanda Martinez  MRN: [id number]  Referring Provider: Einar Pheasant, MD  DOB: Jan 14, 1935  PCP: Einar Pheasant, MD  DOS: 10/17/2018  Note by: Gaspar Cola, MD  Service setting: Ambulatory outpatient  Specialty: Interventional Pain Management  Patient type: Established  Location: [facility] (AMB) Pain Management Facility  Visit type: Interventional Procedure   Primary Reason for Visit: Interventional Pain Management Treatment. CC: Shoulder Pain (bilateral)  Procedure:          Anesthesia, Analgesia, Anxiolysis:  Type: Trigger Point Injection (1-2 muscle groups)          CPT: 20552 Primary Purpose: Therapeutic Region: Upper Thoracic Level: Thoracic Target Area: Trigger Point Approach: Percutaneous, ipsilateral approach. Laterality: Bilateral Paraspinal  Type: Local Anesthesia Indication(s): Analgesia         Local Anesthetic: Lidocaine 1-2% Route: Infiltration ([place]/IM) IV Access: Declined Sedation: Declined   Position: Sitting   Indications: 1. Trigger point of thoracic region (Bilateral) (L>R)   2. Chronic upper back pain (Right)   3. Chronic thoracic back pain (Bilateral) (L>R)    Pain Score: Pre-procedure: 7 /10 Post-procedure: 0-No pain/10  The patient comes into the clinic today due to a flareup in her upper back pain.  Physical examination reveals 2 very distinct trigger points that seem to be symmetrical at the level of the lower inner scapular border, bilaterally.  This trigger point seems to be in the scapular region of the rhomboid muscle, bilaterally.  In addition, the patient has been complaining of pain in the right lower back.  On 06/04/2018 we completed a right-sided lumbar facet radiofrequency ablation.  Previously we had done a radiofrequency ablation on the right side on 10/25/2017, but at that time we had done both the lumbar facet and the SI joint.  It would seem that having done only the lumbar facets did not provide her with complete relief  of the pain since today, upon examining the patient with the [facility] maneuver, she still has clear evidence of pain coming from the right SI joint area.  In view of this, I will go ahead and schedule her to return for the right sacroiliac joint radiofrequency ablation in order to get better relief in that area.  Medical Necessity: Ms. 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 Over-the-counter medications 2. Anti-inflammatory medications 3. Muscle relaxants 4. Membrane stabilizers 5. Opioids 6. Physical therapy 7. Modalities (Heat, ice, etc.) 8. Invasive techniques such as nerve blocks and radiofrequency ablation. Ms. Kurihara has attained more than 50% relief of the pain from a series of diagnostic injections conducted in separate occasions. For this reason, I believe it is medically necessary to proceed with Radiofrequency Ablation for the purpose of attempting to prolong the duration of the benefits seen with the diagnostic injections.   Pre-op Assessment:  Ms. Tavenner is a 83 y.o. (year old), female patient, seen today for interventional treatment. She  has a past surgical history that includes Breast biopsy (2013); Tonsillectomy and adenoidectomy (79); Vesicovaginal fistula closure w/ TAH (1983); Breast surgery (1986); Breast enhancement surgery (1987); Breast implant removal; Breast implant removal (Right, 08/29/2012); Mastectomy (08/2012); Abdominal hysterectomy; and Colonoscopy with propofol (N/A, 09/13/2016). Ms. Amsler has a current medication list which includes the following prescription(s): acyclovir  ointment, amlodipine, aspirin, calcium carbonate, citalopram, cyclobenzaprine, gabapentin, glucosamine sulfate,  hydrochlorothiazide, hydrocodone-acetaminophen, hydrocodone-acetaminophen, hydrocodone-acetaminophen, letrozole, losartan, lubiprostone, magnesium oxide, meloxicam, metoprolol succinate, multivitamin, omega-3, omeprazole, pantoprazole, and pravastatin, and the following Facility-Administered Medications: lidocaine. Her primarily concern today is the Shoulder Pain (bilateral)  Initial Vital Signs:  Pulse/HCG Rate: 66  Temp: 98.2 F (36.8 C) Resp: 16 BP: (!) 152/84 SpO2: 100 %  BMI: Estimated body mass index is 30.9 kg/m as calculated from the following:   Height as of this encounter: 5\' 4"  (1.626 m).   Weight as of this encounter: 180 lb (81.6 kg).  Risk Assessment: Allergies: Reviewed. She is allergic to sulfa antibiotics and vesicare [solifenacin].  Allergy Precautions: None required Coagulopathies: Reviewed. None identified.  Blood-thinner therapy: None at this time Active Infection(s): Reviewed. None identified. Ms. Demarco is afebrile  Site Confirmation: Ms. Smitherman was asked to confirm the procedure and laterality before marking the site Procedure checklist: Completed Consent: Before the procedure and under the influence of no sedative(s), amnesic(s), or anxiolytics, the patient was informed of the treatment options, risks and possible complications. To fulfill our ethical and legal obligations, as recommended by the American Medical Association's Code of Ethics, I have informed the patient of my clinical impression; the nature and purpose of the treatment or procedure; the risks, benefits, and possible complications of the intervention; the alternatives, including doing nothing; the risk(s) and benefit(s) of the alternative treatment(s) or procedure(s); and the risk(s) and benefit(s) of doing nothing. The patient was provided information about the general risks and possible complications associated with the procedure. These may include, but are not limited to: failure to achieve desired  goals, infection, bleeding, organ or nerve damage, allergic reactions, paralysis, and death. In addition, the patient was informed of those risks and complications associated to the procedure, such as failure to decrease pain; infection; bleeding; organ or nerve damage with subsequent damage to sensory, motor, and/or autonomic systems, resulting in permanent pain, numbness, and/or weakness of one or several areas of the body; allergic reactions; (i.e.: anaphylactic reaction); and/or death. Furthermore, the patient was informed of those risks and complications associated with the medications. These include, but are not limited to: allergic reactions (i.e.: anaphylactic or anaphylactoid reaction(s)); adrenal axis suppression; blood sugar elevation that in diabetics may result in ketoacidosis or comma; water retention that in patients with history of congestive heart failure may result in shortness of breath, pulmonary edema, and decompensation with resultant heart failure; weight gain; swelling or edema; medication-induced neural toxicity; particulate matter embolism and blood vessel occlusion with resultant organ, and/or nervous system infarction; and/or aseptic necrosis of one or more joints. Finally, the patient was informed that Medicine is not an exact science; therefore, there is also the possibility of unforeseen or unpredictable risks and/or possible complications that may result in a catastrophic outcome. The patient indicated having understood very clearly. We have given the patient no guarantees and we have made no promises. Enough time was given to the patient to ask questions, all of which were answered to the patient's satisfaction. Ms. Nazzaro has indicated that she wanted to continue with the procedure. Attestation: I, the ordering provider, attest that I have discussed with the patient the benefits, risks, side-effects, alternatives, likelihood of achieving goals, and potential problems during  recovery for the procedure that I have provided informed consent. Date  Time: 10/17/2018 10:30 AM  Pre-Procedure Preparation:  Monitoring: As per clinic protocol. Respiration, ETCO2, SpO2, BP, heart rate and rhythm monitor placed and checked for  adequate function Safety Precautions: Patient was assessed for positional comfort and pressure points before starting the procedure. Time-out: I initiated and conducted the "Time-out" before starting the procedure, as per protocol. The patient was asked to participate by confirming the accuracy of the "Time Out" information. Verification of the correct person, site, and procedure were performed and confirmed by me, the nursing staff, and the patient. "Time-out" conducted as per Joint Commission's Universal Protocol (UP.01.01.01). Time: 1047  Description of Procedure:          Area Prepped: Entire Posterior Thoracic Region Prepping solution: ChloraPrep (2% chlorhexidine gluconate and 70% isopropyl alcohol) Safety Precautions: Aspiration looking for blood return was conducted prior to all injections. At no point did we inject any substances, as a needle was being advanced. No attempts were made at seeking any paresthesias. Safe injection practices and needle disposal techniques used. Medications properly checked for expiration dates. SDV (single dose vial) medications used. Description of the Procedure: Protocol guidelines were followed. The patient was placed in position over the fluoroscopy table. The target area was identified and the area prepped in the usual manner. Skin & deeper tissues infiltrated with local anesthetic. Appropriate amount of time allowed to pass for local anesthetics to take effect. The procedure needles were then advanced to the target area. Proper needle placement secured. Negative aspiration confirmed. Solution injected in intermittent fashion, asking for systemic symptoms every 0.5cc of injectate. The needles were then removed and the  area cleansed, making sure to leave some of the prepping solution back to take advantage of its long term bactericidal properties.  Vitals:   10/17/18 1027 10/17/18 1051  BP: (!) 152/84 (!) 153/80  Pulse: 66 68  Resp: 16 16  Temp: 98.2 F (36.8 C)   SpO2: 100% 100%  Weight: 180 lb (81.6 kg)   Height: 5\' 4"  (1.626 m)     Start Time: 1047 hrs. End Time: 1048 hrs. Materials:  Needle(s) Type: Regular needle Gauge: 25G Length: 1.5-in Medication(s): Please see orders for medications and dosing details.  Imaging Guidance:          Type of Imaging Technique: None used Indication(s): N/A Exposure Time: No patient exposure Contrast: None used. Fluoroscopic Guidance: N/A Ultrasound Guidance: N/A Interpretation: N/A  Antibiotic Prophylaxis:   Anti-infectives (From admission, onward)   None     Indication(s): None identified  Post-operative Assessment:  Post-procedure Vital Signs:  Pulse/HCG Rate: 68  Temp: 98.2 F (36.8 C) Resp: 16 BP: (!) 153/80 SpO2: 100 %  EBL: None  Complications: No immediate post-treatment complications observed by team, or reported by patient.  Note: The patient tolerated the entire procedure well. A repeat set of vitals were taken after the procedure and the patient was kept under observation following institutional policy, for this type of procedure. Post-procedural neurological assessment was performed, showing return to baseline, prior to discharge. The patient was provided with post-procedure discharge instructions, including a section on how to identify potential problems. Should any problems arise concerning this procedure, the patient was given instructions to immediately contact us, at any time, without hesitation. In any case, we plan to contact the patient by telephone for a follow-up status report regarding this interventional procedure.  Comments:  No additional relevant information.  Plan of Care  Interventional management  options: Planned, scheduled, and/or pending: Diagnostic right-sided sacroiliac joint RFA #2 under fluoroscopic guidance and IV sedation   Considering: Diagnosticright-sided lumbar facet block  Palliative right-sided lumbar facet radiofrequency ablation  PalliativeRight suprascapular muscle trigger  point injection  Diagnosticbilateral cervical facet block  Possiblebilateral cervical facet radiofrequency ablation  Diagnostic right-sided cervical epidural steroid injection  Diagnosticintra-articular hip joint injection  Possible hip joint radiofrequency ablation   Palliative PRN treatment(s): Diagnostic right-sided lumbar facet block  Palliative right-sided lumbar facet RFA #3(last done on06/18/19, 10/25/17) Palliative right-sided sacroiliac joint RFA (last done on 10/25/2017) Palliative left-sided lumbar facet RFA (last done on 02/12/2018) Palliative Right suprascapular muscle trigger point injection  Diagnosticbilateral cervical facet block  Diagnosticright-sided cervical epidural steroid injection Diagnosticintra-articular hip joint injection Palliativeright-sided L3-4 interlaminar LESI#3 Palliative right-sided L5 transforaminal LESI #3   Imaging Orders  No imaging studies ordered today    Procedure Orders     TRIGGER POINT INJECTION     Radiofrequency Sacroiliac Joint  Medications ordered for procedure: Meds ordered this encounter  Medications  . methylPREDNISolone acetate (DEPO-MEDROL) injection 80 mg  . ropivacaine (PF) 2 mg/mL (0.2%) (NAROPIN) injection 9 mL  . lidocaine (XYLOCAINE) 2 % (with pres) injection 400 mg   Medications administered: We administered methylPREDNISolone acetate and ropivacaine (PF) 2 mg/mL (0.2%).  See the medical record for exact dosing, route, and time of administration.  Disposition: Discharge home  Discharge Date & Time: 10/17/2018; 1058 hrs.   Physician-requested Follow-up: Return for RFA (fluoro + sedation): (R) SI  RFA #2.  Future Appointments  Date Time Provider [place]  10/31/2018  2:15 PM Milinda Pointer, MD [facility] None  01/08/2019 10:30 AM Vevelyn Francois, NP [facility] None  01/13/2019  2:00 PM Einar Pheasant, MD [facility] [place] [facility] [place]  04/08/2019 11:15 AM Lloyd Huger, MD [facility] None  07/10/2019  9:30 AM Einar Pheasant, MD [facility]  07/10/2019 10:00 AM O'Brien-Blaney, Bryson Corona, LPN [facility]   Primary Care Physician: Einar Pheasant, MD Location: [facility] Note by: Gaspar Cola, MD Date: 10/17/2018; Time: 11:16 AM  Disclaimer:  Medicine is not an exact science. The only guarantee in medicine is that nothing is guaranteed. It is important to note that the decision to proceed with this intervention was based on the information collected from the patient. The Data and conclusions were drawn from the patient's questionnaire, the interview, and the physical examination. Because the information was provided in large part by the patient, it cannot be guaranteed that it has not been purposely or unconsciously manipulated. Every effort has been made to obtain as much relevant data as possible for this evaluation. It is important to note that the conclusions that lead to this procedure are derived in large part from the available data. Always take into account that the treatment will also be dependent on availability of resources and existing treatment guidelines, considered by other Pain Management Practitioners as being common knowledge and practice, at the time of the intervention. For Medico-Legal purposes, it is also important to point out that variation in procedural techniques and pharmacological choices are the acceptable norm. The indications, contraindications, technique, and results of the above procedure should only be interpreted and judged by a Board-Certified Interventional Pain Specialist with extensive familiarity and expertise in the  same exact procedure and technique.

## 2018-10-18 ENCOUNTER — Telehealth: Payer: Self-pay

## 2018-10-18 NOTE — Telephone Encounter (Signed)
Post procedure phone call.  LM 

## 2018-10-24 ENCOUNTER — Ambulatory Visit (HOSPITAL_BASED_OUTPATIENT_CLINIC_OR_DEPARTMENT_OTHER): Payer: Medicare Other | Admitting: Pain Medicine

## 2018-10-24 ENCOUNTER — Other Ambulatory Visit: Payer: Self-pay

## 2018-10-24 ENCOUNTER — Ambulatory Visit
Admission: RE | Admit: 2018-10-24 | Discharge: 2018-10-24 | Disposition: A | Discharge status: Home / Self Care | Payer: Medicare Other | Source: Ambulatory Visit | Attending: Pain Medicine | Admitting: Pain Medicine

## 2018-10-24 ENCOUNTER — Encounter: Payer: Self-pay | Admitting: Pain Medicine

## 2018-10-24 ENCOUNTER — Ambulatory Visit: Payer: Medicare Other | Admitting: Pain Medicine

## 2018-10-24 VITALS — BP 191/98 | HR 61 | Temp 98.3°F | Resp 15 | Ht 64.0 in | Wt 180.0 lb

## 2018-10-24 DIAGNOSIS — G8929 Other chronic pain: Secondary | ICD-10-CM | POA: Diagnosis not present

## 2018-10-24 DIAGNOSIS — M25551 Pain in right hip: Secondary | ICD-10-CM | POA: Diagnosis not present

## 2018-10-24 DIAGNOSIS — Z7982 Long term (current) use of aspirin: Secondary | ICD-10-CM | POA: Insufficient documentation

## 2018-10-24 DIAGNOSIS — M5388 Other specified dorsopathies, sacral and sacrococcygeal region: Secondary | ICD-10-CM | POA: Insufficient documentation

## 2018-10-24 DIAGNOSIS — M533 Sacrococcygeal disorders, not elsewhere classified: Secondary | ICD-10-CM

## 2018-10-24 DIAGNOSIS — M25552 Pain in left hip: Secondary | ICD-10-CM | POA: Diagnosis not present

## 2018-10-24 DIAGNOSIS — M47818 Spondylosis without myelopathy or radiculopathy, sacral and sacrococcygeal region: Secondary | ICD-10-CM | POA: Diagnosis not present

## 2018-10-24 DIAGNOSIS — Z79891 Long term (current) use of opiate analgesic: Secondary | ICD-10-CM | POA: Diagnosis not present

## 2018-10-24 DIAGNOSIS — G8918 Other acute postprocedural pain: Secondary | ICD-10-CM

## 2018-10-24 DIAGNOSIS — M545 Low back pain, unspecified: Secondary | ICD-10-CM

## 2018-10-24 DIAGNOSIS — Z79899 Other long term (current) drug therapy: Secondary | ICD-10-CM | POA: Diagnosis not present

## 2018-10-24 DIAGNOSIS — M461 Sacroiliitis, not elsewhere classified: Secondary | ICD-10-CM

## 2018-10-24 DIAGNOSIS — Z9882 Breast implant status: Secondary | ICD-10-CM | POA: Insufficient documentation

## 2018-10-24 MED ORDER — MIDAZOLAM HCL 5 MG/5ML IJ SOLN
1.0000 mg | INTRAMUSCULAR | Status: AC | PRN
  Administered 2018-10-24: 1.5 mg via INTRAVENOUS
  Filled 2018-10-24: qty 5

## 2018-10-24 MED ORDER — LACTATED RINGERS IV SOLN
1000.0000 mL | Freq: Once | INTRAVENOUS | Status: AC
  Administered 2018-10-24: 1000 mL via INTRAVENOUS

## 2018-10-24 MED ORDER — OXYCODONE-ACETAMINOPHEN 5-325 MG PO TABS: 1.0000 | ORAL_TABLET | Freq: Three times a day (TID) | ORAL | 0 refills | Status: AC | PRN

## 2018-10-24 MED ORDER — FENTANYL CITRATE (PF) 100 MCG/2ML IJ SOLN
25.0000 ug | INTRAMUSCULAR | Status: AC | PRN
  Administered 2018-10-24: 75 ug via INTRAVENOUS
  Filled 2018-10-24: qty 2

## 2018-10-24 MED ORDER — ROPIVACAINE HCL 2 MG/ML IJ SOLN
9.0000 mL | Freq: Once | INTRAMUSCULAR | Status: AC
  Administered 2018-10-24: 9 mL via PERINEURAL
  Filled 2018-10-24: qty 10

## 2018-10-24 MED ORDER — TRIAMCINOLONE ACETONIDE 40 MG/ML IJ SUSP
40.0000 mg | Freq: Once | INTRAMUSCULAR | Status: AC
  Administered 2018-10-24: 40 mg
  Filled 2018-10-24: qty 1

## 2018-10-24 MED ORDER — LIDOCAINE HCL 2 % IJ SOLN
20.0000 mL | Freq: Once | INTRAMUSCULAR | Status: AC
  Administered 2018-10-24: 400 mg
  Filled 2018-10-24: qty 40

## 2018-10-24 NOTE — Progress Notes (Signed)
Patient's Name: Wanda Martinez  MRN: [id number]  Referring Provider: Einar Pheasant, MD  DOB: 09-Sep-1935  PCP: Einar Pheasant, MD  DOS: 10/24/2018  Note by: Gaspar Cola, MD  Service setting: Ambulatory outpatient  Specialty: Interventional Pain Management  Patient type: Established  Location: [facility] (AMB) Pain Management Facility  Visit type: Interventional Procedure   Primary Reason for Visit: Interventional Pain Management Treatment. CC: Back Pain and Neck Pain  Procedure:          Anesthesia, Analgesia, Anxiolysis:  Type: Thermal Sacroiliac joint Radiofrequency Ablation (medial branch of L5, S1, S2, and S3)   #2  Region: Lumbosacral Level: L5, S1, S2, & S3 Medial Branch Level(s). These levels will denervate the posterior Sacroiliac Joint innervation. Primary Purpose: Therapeutic Region: Posterolateral Lumbosacral Spine Laterality: Right  Type: Moderate (Conscious) Sedation combined with Local Anesthesia Indication(s): Analgesia and Anxiety Route: Intravenous (IV) IV Access: Secured Sedation: Meaningful verbal contact was maintained at all times during the procedure  Local Anesthetic: Lidocaine 1-2%  Position: Supine Prone   Indications: 1. Other specified dorsopathies, sacral and sacrococcygeal region   2. Chronic sacroiliac joint pain (Right)   3. Osteoarthritis of sacroiliac joint (Right)   4. Chronic hip pain (Bilateral)   5. Chronic low back pain (Primary Source of Pain) (Bilateral) (R>L)    Wanda Martinez has been dealing with the above chronic pain for longer than three months and has either failed to respond, was unable to tolerate, or simply did not get enough benefit from other more conservative therapies including, but not limited to: 1. Over-the-counter medications 2. Anti-inflammatory medications 3. Muscle relaxants 4. Membrane stabilizers 5. Opioids 6. Physical therapy and/or chiropractic manipulation 7. Modalities (Heat, ice, etc.) 8. Invasive  techniques such as nerve blocks. Wanda Martinez has attained more than 50% relief of the pain from a series of diagnostic injections conducted in separate occasions.  Pain Score: Pre-procedure: 6 /10 Post-procedure: 0-No pain/10  Pre-op Assessment:  Wanda Martinez is a 83 y.o. (year old), female patient, seen today for interventional treatment. She  has a past surgical history that includes Breast biopsy (2013); Tonsillectomy and adenoidectomy (79); Vesicovaginal fistula closure w/ TAH (1983); Breast surgery (1986); Breast enhancement surgery (1987); Breast implant removal; Breast implant removal (Right, 08/29/2012); Mastectomy (08/2012); Abdominal hysterectomy; and Colonoscopy with propofol (N/A, 09/13/2016). Wanda Martinez has a current medication list which includes the following prescription(s): acyclovir ointment, amlodipine, aspirin, calcium carbonate, citalopram, cyclobenzaprine, gabapentin, glucosamine sulfate, hydrochlorothiazide, hydrocodone-acetaminophen, hydrocodone-acetaminophen, hydrocodone-acetaminophen, letrozole, losartan, lubiprostone, magnesium oxide, meloxicam, metoprolol succinate, multivitamin, omega-3, omeprazole, pantoprazole, pravastatin, oxycodone-acetaminophen, and oxycodone-acetaminophen. Her primarily concern today is the Back Pain and Neck Pain  Initial Vital Signs:  Pulse/HCG Rate: 61ECG Heart Rate: 67 Temp: 98 F (36.7 C) Resp: 18 BP: (!) 131/91 SpO2: 100 %  BMI: Estimated body mass index is 30.9 kg/m as calculated from the following:   Height as of this encounter: 5\' 4"  (1.626 m).   Weight as of this encounter: 180 lb (81.6 kg).  Risk Assessment: Allergies: Reviewed. She is allergic to sulfa antibiotics and vesicare [solifenacin].  Allergy Precautions: None required Coagulopathies: Reviewed. None identified.  Blood-thinner therapy: None at this time Active Infection(s): Reviewed. None identified. Wanda Martinez is afebrile  Site Confirmation: Wanda Martinez was asked to  confirm the procedure and laterality before marking the site Procedure checklist: Completed Consent: Before the procedure and under the influence of no sedative(s), amnesic(s), or anxiolytics, the patient was informed of the treatment options, risks and possible complications. To fulfill our  ethical and legal obligations, as recommended by the American Medical Association's Code of Ethics, I have informed the patient of my clinical impression; the nature and purpose of the treatment or procedure; the risks, benefits, and possible complications of the intervention; the alternatives, including doing nothing; the risk(s) and benefit(s) of the alternative treatment(s) or procedure(s); and the risk(s) and benefit(s) of doing nothing. The patient was provided information about the general risks and possible complications associated with the procedure. These may include, but are not limited to: failure to achieve desired goals, infection, bleeding, organ or nerve damage, allergic reactions, paralysis, and death. In addition, the patient was informed of those risks and complications associated to the procedure, such as failure to decrease pain; infection; bleeding; organ or nerve damage with subsequent damage to sensory, motor, and/or autonomic systems, resulting in permanent pain, numbness, and/or weakness of one or several areas of the body; allergic reactions; (i.e.: anaphylactic reaction); and/or death. Furthermore, the patient was informed of those risks and complications associated with the medications. These include, but are not limited to: allergic reactions (i.e.: anaphylactic or anaphylactoid reaction(s)); adrenal axis suppression; blood sugar elevation that in diabetics may result in ketoacidosis or comma; water retention that in patients with history of congestive heart failure may result in shortness of breath, pulmonary edema, and decompensation with resultant heart failure; weight gain; swelling or  edema; medication-induced neural toxicity; particulate matter embolism and blood vessel occlusion with resultant organ, and/or nervous system infarction; and/or aseptic necrosis of one or more joints. Finally, the patient was informed that Medicine is not an exact science; therefore, there is also the possibility of unforeseen or unpredictable risks and/or possible complications that may result in a catastrophic outcome. The patient indicated having understood very clearly. We have given the patient no guarantees and we have made no promises. Enough time was given to the patient to ask questions, all of which were answered to the patient's satisfaction. Wanda Martinez has indicated that she wanted to continue with the procedure. Attestation: I, the ordering provider, attest that I have discussed with the patient the benefits, risks, side-effects, alternatives, likelihood of achieving goals, and potential problems during recovery for the procedure that I have provided informed consent. Date  Time: 10/24/2018  2:14 PM  Pre-Procedure Preparation:  Monitoring: As per clinic protocol. Respiration, ETCO2, SpO2, BP, heart rate and rhythm monitor placed and checked for adequate function Safety Precautions: Patient was assessed for positional comfort and pressure points before starting the procedure. Time-out: I initiated and conducted the "Time-out" before starting the procedure, as per protocol. The patient was asked to participate by confirming the accuracy of the "Time Out" information. Verification of the correct person, site, and procedure were performed and confirmed by me, the nursing staff, and the patient. "Time-out" conducted as per Joint Commission's Universal Protocol (UP.01.01.01). Time: 1543  Description of Procedure:          Laterality: Right Level: L5, S1, S2, & S3 Medial Branch Level(s), at the posterior Sacroiliac Joint Area Prepped: Lumbosacral Prepping solution: ChloraPrep (2% chlorhexidine  gluconate and 70% isopropyl alcohol) Safety Precautions: Aspiration looking for blood return was conducted prior to all injections. At no point did we inject any substances, as a needle was being advanced. Before injecting, the patient was told to immediately notify me if she was experiencing any new onset of "ringing in the ears, or metallic taste in the mouth". No attempts were made at seeking any paresthesias. Safe injection practices and needle disposal techniques  used. Medications properly checked for expiration dates. SDV (single dose vial) medications used. After the completion of the procedure, all disposable equipment used was discarded in the proper designated medical waste containers. Local Anesthesia: Protocol guidelines were followed. The patient was positioned over the fluoroscopy table. The area was prepped in the usual manner. The time-out was completed. The target area was identified using fluoroscopy. A 12-in long, straight, sterile hemostat was used with fluoroscopic guidance to locate the targets for each level blocked. Once located, the skin was marked with an approved surgical skin marker. Once all sites were marked, the skin (epidermis, dermis, and hypodermis), as well as deeper tissues (fat, connective tissue and muscle) were infiltrated with a small amount of a short-acting local anesthetic, loaded on a 10cc syringe with a 25G, 1.5-in  Needle. An appropriate amount of time was allowed for local anesthetics to take effect before proceeding to the next step. Local Anesthetic: Lidocaine 2.0% The unused portion of the local anesthetic was discarded in the proper designated containers. Technical explanation of process:  Radiofrequency Ablation (RFA) L5 Medial Branch Nerve RFA: The target area for the L5 medial branch is at the junction of the postero-lateral aspect of the superior articular process of S1 and the superior, posterior, and medial edge of the sacral ala. Under fluoroscopic  guidance, a Radiofrequency needle was inserted until contact was made with os over the superior postero-lateral aspect of the pedicular shadow (target area). Sensory and motor testing was conducted to properly adjust the position of the needle. Once satisfactory placement of the needle was achieved, the numbing solution was slowly injected after negative aspiration for blood. 2.0 mL of the nerve block solution was injected without difficulty or complication. After waiting for at least 3 minutes, the ablation was performed. Once completed, the needle was removed intact. S1 Primary Dorsal Rami and Lateral Branch Nerve RFA: The target area for the S1 medial branch is located inferior to the junction of the S1 superior articular process and the L5 inferior articular process, posterior, inferior, and lateral to the 6 o'clock position of the L5-S1 facet joint, just superior to the S1 posterior foramen. Under fluoroscopic guidance, the Radiofrequency needle was advanced until contact was made with os over the Target area. Sensory and motor testing was conducted to properly adjust the position of the needle. Once satisfactory placement of the needle was achieved, the numbing solution was slowly injected after negative aspiration for blood. 2.0 mL of the nerve block solution was injected without difficulty or complication. After waiting for at least 3 minutes, the ablation was performed. Once completed, the needle was removed intact. S2 Primary Dorsal Rami and Lateral Branch Nerve RFA: The target area for the S2 medial branch is at the posterior superior lateral of the S2 posterior neural foramen. Under fluoroscopic guidance, the Radiofrequency needle was advanced until contact was made with os over the Target area. Sensory and motor testing was conducted to properly adjust the position of the needle. Once satisfactory placement of the needle was achieved, the numbing solution was slowly injected after negative aspiration  for blood. 2.0 mL of the nerve block solution was injected without difficulty or complication. After waiting for at least 3 minutes, the ablation was performed. Once completed, the needle was removed intact. S3 Primary Dorsal Rami and Lateral Branch Nerve RFA: The target area for the S3 medial branch is at the posterior superior lateral of the S3 posterior neural foramen. Under fluoroscopic guidance, the Radiofrequency needle was advanced  until contact was made with os over the Target area. Sensory and motor testing was conducted to properly adjust the position of the needle. Once satisfactory placement of the needle was achieved, the numbing solution was slowly injected after negative aspiration for blood. 2.0 mL of the nerve block solution was injected without difficulty or complication. After waiting for at least 3 minutes, the ablation was performed. Once completed, the needle was removed intact. Radiofrequency lesioning (ablation):  Radiofrequency Generator: NeuroTherm [id number] Sensory Stimulation Parameters: 50 Hz was used to locate & identify the nerve, making sure that the needle was positioned such that there was no sensory stimulation below 0.3 V or above 0.7 V. Motor Stimulation Parameters: 2 Hz was used to evaluate the motor component. Care was taken not to lesion any nerves that demonstrated motor stimulation of the lower extremities at an output of less than 2.5 times that of the sensory threshold, or a maximum of 2.0 V. Lesioning Technique Parameters: Standard Radiofrequency settings. (Not bipolar or pulsed.) Temperature Settings: 80 degrees C Lesioning time: 60 seconds Intra-operative Compliance: Compliant Materials & Medications: Needle(s) (Electrode/Cannula) Type: Teflon-coated, curved tip, Radiofrequency needle(s) Gauge: 22G Length: 10cm Numbing solution: 0.2% PF-Ropivacaine + Triamcinolone (40 mg/mL) diluted to a final concentration of 4 mg of Triamcinolone/mL of Ropivacaine The  unused portion of the solution was discarded in the proper designated containers.   Once the entire procedure was completed, the treated area was cleaned, making sure to leave some of the prepping solution back to take advantage of its long term bactericidal properties. Intra-operative Compliance: Compliant  Vitals:   10/24/18 1620 10/24/18 1630 10/24/18 1640 10/24/18 1649  BP: (!) 159/80 (!) 178/79 (!) 197/89 (!) 191/98  Pulse:      Resp: 15 17 11 15   Temp:  98.3 F (36.8 C)    SpO2: 100% 98% 100% 100%  Weight:      Height:        Start Time: 1543 hrs. End Time: 1620 hrs.  Imaging Guidance (Non-Spinal):          Type of Imaging Technique: Fluoroscopy Guidance (Non-Spinal) Indication(s): Assistance in needle guidance and placement for procedures requiring needle placement in or near specific anatomical locations not easily accessible without such assistance. Exposure Time: Please see nurses notes. Contrast: Before injecting any contrast, we confirmed that the patient did not have an allergy to iodine, shellfish, or radiological contrast. Once satisfactory needle placement was completed at the desired level, radiological contrast was injected. Contrast injected under live fluoroscopy. No contrast complications. See chart for type and volume of contrast used. Fluoroscopic Guidance: I was personally present during the use of fluoroscopy. "Tunnel Vision Technique" used to obtain the best possible view of the target area. Parallax error corrected before commencing the procedure. "Direction-depth-direction" technique used to introduce the needle under continuous pulsed fluoroscopy. Once target was reached, antero-posterior, oblique, and lateral fluoroscopic projection used confirm needle placement in all planes. Images permanently stored in EMR. Interpretation: I personally interpreted the imaging intraoperatively. Adequate needle placement confirmed in multiple planes. Appropriate spread of  contrast into desired area was observed. No evidence of afferent or efferent intravascular uptake. Permanent images saved into the patient's record.  Antibiotic Prophylaxis:   Anti-infectives (From admission, onward)   None     Indication(s): None identified  Post-operative Assessment:  Post-procedure Vital Signs:  Pulse/HCG Rate: 61(!) 57 Temp: 98.3 F (36.8 C) Resp: 15 BP: (!) 191/98 SpO2: 100 %  EBL: None  Complications: No immediate post-treatment complications  observed by team, or reported by patient.  Note: The patient tolerated the entire procedure well. A repeat set of vitals were taken after the procedure and the patient was kept under observation following institutional policy, for this type of procedure. Post-procedural neurological assessment was performed, showing return to baseline, prior to discharge. The patient was provided with post-procedure discharge instructions, including a section on how to identify potential problems. Should any problems arise concerning this procedure, the patient was given instructions to immediately contact us, at any time, without hesitation. In any case, we plan to contact the patient by telephone for a follow-up status report regarding this interventional procedure.  Comments:  No additional relevant information.  Plan of Care    Imaging Orders     DG C-Arm 1-60 Min-No Report  Procedure Orders     Radiofrequency Sacroiliac Joint  Medications ordered for procedure: Meds ordered this encounter  Medications  . lidocaine (XYLOCAINE) 2 % (with pres) injection 400 mg  . midazolam (VERSED) 5 MG/5ML injection 1-2 mg    Make sure Flumazenil is available in the pyxis when using this medication. If oversedation occurs, administer 0.2 mg IV over 15 sec. If after 45 sec no response, administer 0.2 mg again over 1 min; may repeat at 1 min intervals; not to exceed 4 doses (1 mg)  . fentaNYL (SUBLIMAZE) injection 25-50 mcg    Make sure Narcan  is available in the pyxis when using this medication. In the event of respiratory depression (RR< 8/min): Titrate NARCAN (naloxone) in increments of 0.1 to 0.2 mg IV at 2-3 minute intervals, until desired degree of reversal.  . lactated ringers infusion 1,000 mL  . ropivacaine (PF) 2 mg/mL (0.2%) (NAROPIN) injection 9 mL  . triamcinolone acetonide (KENALOG-40) injection 40 mg  . oxyCODONE-acetaminophen (PERCOCET) 5-325 MG tablet    Sig: Take 1 tablet by mouth every 8 (eight) hours as needed for up to 7 days for severe pain. Must last 7 days.    Dispense:  21 tablet    Refill:  0    For acute post-operative pain. Not to be refilled.  Must last 7 days.  Do not take the hydrocodone while on this medication  . oxyCODONE-acetaminophen (PERCOCET) 5-325 MG tablet    Sig: Take 1 tablet by mouth every 8 (eight) hours as needed for up to 7 days for severe pain. Must last 7 days.    Dispense:  21 tablet    Refill:  0    For acute post-operative pain. Not to be refilled.  Must last 7 days.  Do not take the hydrocodone while on this medication.   Medications administered: We administered lidocaine, midazolam, fentaNYL, lactated ringers, ropivacaine (PF) 2 mg/mL (0.2%), and triamcinolone acetonide.  See the medical record for exact dosing, route, and time of administration.  Disposition: Discharge home  Discharge Date & Time: 10/24/2018; 1652 hrs.   Physician-requested Follow-up: Return for post-procedure eval (2 wks), w/ Dr. Dossie Arbour.  Future Appointments  Date Time Provider [place]  12/03/2018 10:30 AM Milinda Pointer, MD [facility] None  01/08/2019 10:30 AM Vevelyn Francois, NP [facility] None  01/13/2019  2:00 PM Einar Pheasant, MD [facility]  04/08/2019 11:15 AM Lloyd Huger, MD [facility] None  07/10/2019  9:30 AM Einar Pheasant, MD [facility]  07/10/2019 10:00 AM O'Brien-Blaney, Bryson Corona, LPN [facility]   Primary Care Physician: Einar Pheasant, MD Location: [facility]  Outpatient Pain Management Facility Note by: Gaspar Cola, MD Date: 10/24/2018; Time:  4:26 PM  Disclaimer:  Medicine is not an Chief Strategy Officer. The only guarantee in medicine is that nothing is guaranteed. It is important to note that the decision to proceed with this intervention was based on the information collected from the patient. The Data and conclusions were drawn from the patient's questionnaire, the interview, and the physical examination. Because the information was provided in large part by the patient, it cannot be guaranteed that it has not been purposely or unconsciously manipulated. Every effort has been made to obtain as much relevant data as possible for this evaluation. It is important to note that the conclusions that lead to this procedure are derived in large part from the available data. Always take into account that the treatment will also be dependent on availability of resources and existing treatment guidelines, considered by other Pain Management Practitioners as being common knowledge and practice, at the time of the intervention. For Medico-Legal purposes, it is also important to point out that variation in procedural techniques and pharmacological choices are the acceptable norm. The indications, contraindications, technique, and results of the above procedure should only be interpreted and judged by a Board-Certified Interventional Pain Specialist with extensive familiarity and expertise in the same exact procedure and technique.

## 2018-10-24 NOTE — Progress Notes (Signed)
Safety precautions to be maintained throughout the outpatient stay will include: orient to surroundings, keep bed in low position, maintain call bell within reach at all times, provide assistance with transfer out of bed and ambulation.  

## 2018-10-24 NOTE — Patient Instructions (Signed)
___________________________________________________________________________________________  Post-Radiofrequency (RF) Discharge Instructions  You have just completed a Radiofrequency Neurotomy.  The following instructions will provide you with information and guidelines for self-care upon discharge.  If at any time you have questions or concerns please call your physician. DO NOT DRIVE YOURSELF!!  Instructions:  Apply ice: Fill a plastic sandwich bag with crushed ice. Cover it with a small towel and apply to injection site. Apply for 15 minutes then remove x 15 minutes. Repeat sequence on day of procedure, until you go to bed. The purpose is to minimize swelling and discomfort after procedure.  Apply heat: Apply heat to procedure site starting the day following the procedure. The purpose is to treat any soreness and discomfort from the procedure.  Food intake: No eating limitations, unless stipulated above.  Nevertheless, if you have had sedation, you may experience some nausea.  In this case, it may be wise to wait at least two hours prior to resuming regular diet.  Physical activities: Keep activities to a minimum for the first 8 hours after the procedure. For the first 24 hours after the procedure, do not drive a motor vehicle,  Operate heavy machinery, power tools, or handle any weapons.  Consider walking with the use of an assistive device or accompanied by an adult for the first 24 hours.  Do not drink alcoholic beverages including beer.  Do not make any important decisions or sign any legal documents. Go home and rest today.  Resume activities tomorrow, as tolerated.  Use caution in moving about as you may experience mild leg weakness.  Use caution in cooking, use of household electrical appliances and climbing steps.  Driving: If you have received any sedation, you are not allowed to drive for 24 hours after your procedure.  Blood thinner: Restart your blood thinner 6 hours after your  procedure. (Only for those taking blood thinners)  Insulin: As soon as you can eat, you may resume your normal dosing schedule. (Only for those taking insulin)  Medications: May resume pre-procedure medications.  Do not take any drugs, other than what has been prescribed to you.  Infection prevention: Keep procedure site clean and dry.  Post-procedure Pain Diary: Extremely important that this be done correctly and accurately. Recorded information will be used to determine the next step in treatment.  Pain evaluated is that of treated area only. Do not include pain from an untreated area.  Complete every hour, on the hour, for the initial 8 hours. Set an alarm to help you do this part accurately.  Do not go to sleep and have it completed later. It will not be accurate.  Follow-up appointment: Keep your follow-up appointment after the procedure. Usually 2 weeks for most procedures. (6 weeks in the case of radiofrequency.) Bring you pain diary.   Expect:  From numbing medicine (AKA: Local Anesthetics): Numbness or decrease in pain.  Onset: Full effect within 15 minutes of injected.  Duration: It will depend on the type of local anesthetic used. On the average, 1 to 8 hours.   From steroids: Decrease in swelling or inflammation. Once inflammation is improved, relief of the pain will follow.  Onset of benefits: Depends on the amount of swelling present. The more swelling, the longer it will take for the benefits to be seen. In some cases, up to 10 days.  Duration: Steroids will stay in the system x 2 weeks. Duration of benefits will depend on multiple posibilities including persistent irritating factors.  From procedure: Some   discomfort is to be expected once the numbing medicine wears off. This should be minimal if ice and heat are applied as instructed.  Call if:  You experience numbness and weakness that gets worse with time, as opposed to wearing off.  He experience any unusual  bleeding, difficulty breathing, or loss of the ability to control your bowel and bladder. (This applies to Spinal procedures only)  You experience any redness, swelling, heat, red streaks, elevated temperature, fever, or any other signs of a possible infection.  Emergency Numbers:  Durning business hours (Monday - Thursday, 8:00 AM - 4:00 PM) (Friday, 9:00 AM - 12:00 Noon): [phone]  After hours: [phone] ____________________________________________________________________________________________    

## 2018-10-25 ENCOUNTER — Telehealth: Payer: Self-pay

## 2018-10-25 NOTE — Telephone Encounter (Signed)
Post procedure phone call.  Mailbox is full and unable to leave a message.

## 2018-10-29 ENCOUNTER — Other Ambulatory Visit: Payer: Self-pay | Admitting: Pharmacist

## 2018-10-29 NOTE — Patient Outreach (Signed)
[facility]) Care Management  10/29/2018  Wanda Martinez 07-Jul-1935 [id number]  83 year old female referred to [place] for medication review due to High Risk review of [employer] beneficiaries. PMHx includes, but not limited to, HTN, GERD, hx chronic pain, CKD, hx breast cancer.    Patient was contacted today to review medications due to multiple Green codes (HTN) and elevated RAF score. Ms. Schmuhl noted that she had just walked in the door, and would prefer if we could review her medications at a later time. Decided that I would call her back tomorrow.   Catie Darnelle Maffucci, PharmD PGY2 Ambulatory Care Pharmacy Resident, [facility] Phone: [phone]

## 2018-10-30 ENCOUNTER — Other Ambulatory Visit: Payer: Self-pay | Admitting: Pharmacist

## 2018-10-30 NOTE — Patient Outreach (Signed)
[facility]) Care Management  10/30/2018  Wanda Martinez 04-Jan-1935 [id number]   83 year old female referred to [name] for medication review due to High Risk review of [employer] beneficiaries. PMHx includes, but not limited to, HTN, GERD, hx chronic pain, CKD, hx breast cancer.   Spoke with patient yesterday, we mutually decided that I would call her back today. Called her back; was unable to leave a message on home or cell phone due to the mailbox being full.   Will try again in 2-3 business days.  Catie Darnelle Maffucci, PharmD PGY2 Ambulatory Care Pharmacy Resident, [facility] Phone: [phone]

## 2018-10-31 ENCOUNTER — Ambulatory Visit: Payer: Medicare Other | Admitting: Pain Medicine

## 2018-11-05 ENCOUNTER — Other Ambulatory Visit: Payer: Self-pay | Admitting: Pharmacist

## 2018-11-05 NOTE — Patient Outreach (Signed)
[facility]) Care Management  11/05/2018  [place] Noori [birth year] [id number]   83year old femalereferred to [place] for medication review due to High Risk review of [employer]beneficiaries. PMHx includes, but not limited to, HTN, GERD, hx chronic pain, CKD, hx breast cancer.  Unsuccessful outreach attempt #3. Left HIPAA compliant message for patient to return my call.   Plan - If I have not heard back in 2-3 business days, I will close case.   Catie Darnelle Maffucci, PharmD PGY2 Ambulatory Care Pharmacy Resident, [facility] Phone: [phone]

## 2018-11-08 ENCOUNTER — Other Ambulatory Visit: Payer: Self-pay | Admitting: Pharmacist

## 2018-11-08 NOTE — Patient Outreach (Signed)
[facility]) Care Management  11/08/2018  Wanda Martinez 1935-09-12 [id number]   83year old femalereferred to [place] for medication review due to High Risk review of [employer]CObeneficiaries. PMHx includes, but not limited to, HTN, GERD, hx chronic pain, CKD, hx breast cancer.  Unable to maintain contact with patient; will close pharmacy case.   Catie Darnelle Maffucci, PharmD PGY2 Ambulatory Care Pharmacy Resident, [place] [facility] Phone: [phone]

## 2018-11-11 ENCOUNTER — Ambulatory Visit: Payer: Medicare Other | Admitting: Pain Medicine

## 2018-11-21 ENCOUNTER — Ambulatory Visit: Payer: Medicare Other | Admitting: Pain Medicine

## 2018-12-03 ENCOUNTER — Ambulatory Visit: Payer: Medicare Other | Admitting: Pain Medicine

## 2018-12-10 ENCOUNTER — Encounter: Payer: Medicare Other | Admitting: Internal Medicine

## 2018-12-19 ENCOUNTER — Other Ambulatory Visit: Payer: Self-pay | Admitting: Internal Medicine

## 2018-12-24 NOTE — Telephone Encounter (Signed)
LMTCB to ask why she ws requesting refill?

## 2018-12-27 ENCOUNTER — Encounter: Payer: Self-pay | Admitting: Internal Medicine

## 2019-01-01 NOTE — Telephone Encounter (Signed)
This medication is no longer on med list

## 2019-01-08 ENCOUNTER — Ambulatory Visit: Payer: Medicare Other | Attending: Nurse Practitioner | Admitting: Nurse Practitioner

## 2019-01-08 ENCOUNTER — Encounter: Payer: Self-pay | Admitting: Nurse Practitioner

## 2019-01-08 VITALS — BP 135/76 | HR 68 | Temp 98.2°F | Resp 16 | Ht 64.0 in | Wt 180.0 lb

## 2019-01-08 DIAGNOSIS — Z79899 Other long term (current) drug therapy: Secondary | ICD-10-CM | POA: Insufficient documentation

## 2019-01-08 DIAGNOSIS — Z79891 Long term (current) use of opiate analgesic: Secondary | ICD-10-CM | POA: Insufficient documentation

## 2019-01-08 DIAGNOSIS — G8929 Other chronic pain: Secondary | ICD-10-CM | POA: Diagnosis not present

## 2019-01-08 DIAGNOSIS — M47816 Spondylosis without myelopathy or radiculopathy, lumbar region: Secondary | ICD-10-CM | POA: Insufficient documentation

## 2019-01-08 DIAGNOSIS — M792 Neuralgia and neuritis, unspecified: Secondary | ICD-10-CM | POA: Diagnosis not present

## 2019-01-08 DIAGNOSIS — M47812 Spondylosis without myelopathy or radiculopathy, cervical region: Secondary | ICD-10-CM | POA: Diagnosis not present

## 2019-01-08 DIAGNOSIS — M7918 Myalgia, other site: Secondary | ICD-10-CM | POA: Diagnosis not present

## 2019-01-08 DIAGNOSIS — G894 Chronic pain syndrome: Secondary | ICD-10-CM | POA: Diagnosis not present

## 2019-01-08 MED ORDER — HYDROCODONE-ACETAMINOPHEN 5-325 MG PO TABS: 1.0000 | ORAL_TABLET | Freq: Three times a day (TID) | ORAL | 0 refills | Status: DC | PRN

## 2019-01-08 MED ORDER — KETOROLAC TROMETHAMINE 60 MG/2ML IM SOLN
60.0000 mg | Freq: Once | INTRAMUSCULAR | Status: AC
  Administered 2019-01-08: 60 mg via INTRAMUSCULAR
  Filled 2019-01-08: qty 2

## 2019-01-08 NOTE — Patient Instructions (Addendum)
____________________________________________________________________________________________  Medication Rules  Purpose: To inform patients, and their family members, of our rules and regulations.  Applies to: All patients receiving prescriptions (written or electronic).  Pharmacy of record: Pharmacy where electronic prescriptions will be sent. If written prescriptions are taken to a different pharmacy, please inform the nursing staff. The pharmacy listed in the electronic medical record should be the one where you would like electronic prescriptions to be sent.  Electronic prescriptions: In compliance with the [place] Streng[place] Opioid Misuse Prevention (STOP) Act of 2017 (Session Law 2017-74/H243), effective December 18, 2018, all controlled substances must be electronically prescribed. Calling prescriptions to the pharmacy will cease to exist.  Prescription refills: Only during scheduled appointments. Applies to all prescriptions.  NOTE: The following applies primarily to controlled substances (Opioid* Pain Medications).   Patient's responsibilities: 1. Pain Pills: Bring all pain pills to every appointment (except for procedure appointments). 2. Pill Bottles: Bring pills in original pharmacy bottle. Always bring the newest bottle. Bring bottle, even if empty. 3. Medication refills: You are responsible for knowing and keeping track of what medications you take and those you need refilled. The day before your appointment: write a list of all prescriptions that need to be refilled. The day of the appointment: give the list to the admitting nurse. Prescriptions will be written only during appointments. If you forget a medication: it will not be "Called in", "Faxed", or "electronically sent". You will need to get another appointment to get these prescribed. No early refills. Do not call asking to have your prescription filled early. 4. Prescription Accuracy: You are responsible for  carefully inspecting your prescriptions before leaving our office. Have the discharge nurse carefully go over each prescription with you, before taking them home. Make sure that your name is accurately spelled, that your address is correct. Check the name and dose of your medication to make sure it is accurate. Check the number of pills, and the written instructions to make sure they are clear and accurate. Make sure that you are given enough medication to last until your next medication refill appointment. 5. Taking Medication: Take medication as prescribed. When it comes to controlled substances, taking less pills or less frequently than prescribed is permitted and encouraged. Never take more pills than instructed. Never take medication more frequently than prescribed.  6. Inform other Doctors: Always inform, all of your healthcare providers, of all the medications you take. 7. Pain Medication from other Providers: You are not allowed to accept any additional pain medication from any other Doctor or Healthcare provider. There are two exceptions to this rule. (see below) In the event that you require additional pain medication, you are responsible for notifying us, as stated below. 8. Medication Agreement: You are responsible for carefully reading and following our Medication Agreement. This must be signed before receiving any prescriptions from our practice. Safely store a copy of your signed Agreement. Violations to the Agreement will result in no further prescriptions. (Additional copies of our Medication Agreement are available upon request.) 9. Laws, Rules, & Regulations: All patients are expected to follow all Federal and State Laws, Statutes, Rules, & Regulations. Ignorance of the Laws does not constitute a valid excuse. The use of any illegal substances is prohibited. 10. Adopted CDC guidelines & recommendations: Target dosing levels will be at or below 60 MME/day. Use of benzodiazepines** is not  recommended.  Exceptions: There are only two exceptions to the rule of not receiving pain medications from other Healthcare Providers. 1.   Exception #1 (Emergencies): In the event of an emergency (i.e.: accident requiring emergency care), you are allowed to receive additional pain medication. However, you are responsible for: As soon as you are able, call our office [phone], at any time of the day or night, and leave a message stating your name, the date and nature of the emergency, and the name and dose of the medication prescribed. In the event that your call is answered by a member of our staff, make sure to document and save the date, time, and the name of the person that took your information.  2. Exception #2 (Planned Surgery): In the event that you are scheduled by another doctor or dentist to have any type of surgery or procedure, you are allowed (for a period no longer than 30 days), to receive additional pain medication, for the acute post-op pain. However, in this case, you are responsible for picking up a copy of our "Post-op Pain Management for Surgeons" handout, and giving it to your surgeon or dentist. This document is available at our office, and does not require an appointment to obtain it. Simply go to our office during business hours (Monday-Thursday from 8:00 AM to 4:00 PM) (Friday 8:00 AM to 12:00 Noon) or if you have a scheduled appointment with [place], prior to your surgery, and ask for it by name. In addition, you will need to provide [place] with your name, name of your surgeon, type of surgery, and date of procedure or surgery.  *Opioid medications include: morphine, codeine, oxycodone, oxymorphone, hydrocodone, hydromorphone, meperidine, tramadol, tapentadol, buprenorphine, fentanyl, methadone. **Benzodiazepine medications include: diazepam (Valium), alprazolam (Xanax), clonazepam (Klonopine), lorazepam (Ativan), clorazepate (Tranxene), chlordiazepoxide (Librium), estazolam (Prosom),  oxazepam (Serax), temazepam (Restoril), triazolam (Halcion) (Last updated: 02/14/2018) ____________________________________________________________________________________________   BMI Assessment: Estimated body mass index is 30.9 kg/m as calculated from the following:   Height as of this encounter: 5\' 4"  (1.626 m).   Weight as of this encounter: 180 lb (81.6 kg).  BMI interpretation table: BMI level Category Range association with higher incidence of chronic pain  <18 kg/m2 Underweight   18.5-24.9 kg/m2 Ideal body weight   25-29.9 kg/m2 Overweight Increased incidence by 20%  30-34.9 kg/m2 Obese (Class I) Increased incidence by 68%  35-39.9 kg/m2 Severe obesity (Class II) Increased incidence by 136%  >40 kg/m2 Extreme obesity (Class III) Increased incidence by 254%   Patient's current BMI Ideal Body weight  Body mass index is 30.9 kg/m. Ideal body weight: 54.7 kg (120 lb 9.5 oz) Adjusted ideal body weight: 65.5 kg (144 lb 5.7 oz)   BMI Readings from Last 4 Encounters:  01/08/19 30.90 kg/m  10/24/18 30.90 kg/m  10/17/18 30.90 kg/m  10/09/18 30.90 kg/m   Wt Readings from Last 4 Encounters:  01/08/19 180 lb (81.6 kg)  10/24/18 180 lb (81.6 kg)  10/17/18 180 lb (81.6 kg)  10/09/18 180 lb (81.6 kg)

## 2019-01-08 NOTE — Progress Notes (Signed)
Patient's Name: Wanda Martinez  MRN: [id number]  Referring Provider: Einar Pheasant, MD  DOB: April 03, 1935  PCP: Einar Pheasant, MD  DOS: 01/08/2019  Note by: Vevelyn Francois NP  Service setting: Ambulatory outpatient  Specialty: Interventional Pain Management  Location: [facility] (AMB) Pain Management Facility    Patient type: Established    Primary Reason(s) for Visit: Encounter for prescription drug management & post-procedure evaluation of chronic illness with mild to moderate exacerbation(Level of risk: moderate) CC: Back Pain (lower right is worse )  HPI  Wanda Martinez is a 84 y.o. year old, female patient, who comes today for a post-procedure evaluation and medication management. She has Hypercholesterolemia; Essential hypertension; Frequent UTI; Primary cancer of upper inner quadrant of right female breast ([place]); Anemia; Tremor; Toenail fungus; Headache; Obesity; Dysuria; Sinusitis; Parkinsons ([place]); Fatigue; Chronic kidney disease (CKD), stage II (mild); Situational depression; Incomplete bladder emptying; Mixed incontinence; HLD (hyperlipidemia); Bladder infection, chronic; Parkinson's disease ([place]); Pure hypercholesterolemia; Hernia, rectovaginal; Long term current use of opiate analgesic; Long term prescription opiate use; Opiate use; Encounter for therapeutic drug level monitoring; Encounter for chronic pain management; Chronic low back pain (Primary Source of Pain) (Bilateral) (R>L); Lumbar spondylosis; Chronic hip pain (Bilateral); Chronic neck pain; Cervical spondylosis; Chronic cervical radicular pain (Right); Osteoarthritis; Diffuse myofascial pain syndrome; Neurogenic pain; Chronic upper back pain (Right); Myofascial pain syndrome (Right) (cervicothoracic); Vaginitis; Lumbar facet syndrome (Bilateral) (R>L); Malignant neoplasm of right female breast ([place]); Chronic constipation; Cervical facet hypertrophy; Cervical facet syndrome ([place]); Chronic shoulder pain (Right); Chronic pain syndrome;  Chronic sacroiliac joint pain (Left); Chronic sacroiliac joint pain (Right); Lumbosacral foraminal stenosis (L5-S1) (Right); Lumbar spinal stenosis (with neurogenic claudication) (L3-4); Chronic lower extremity pain (Secondary Area of Pain) (Right); Chronic lumbar radicular pain (S1) (Right); Osteopenia; Acute postoperative pain; Trochanteric bursitis of hip (Bilateral); Spondylosis without myelopathy or radiculopathy, lumbosacral region; Trigger point with back pain (Right); DDD (degenerative disc disease), lumbosacral; Hyponatremia; Pneumonia; Fall; GERD (gastroesophageal reflux disease); Constipation due to opioid therapy; Pharmacologic therapy; Disorder of skeletal system; Problems influencing health status; Chronic upper extremity pain (Right); Chronic thoracic back pain (Bilateral) (L>R); Trigger point of thoracic region (Bilateral) (L>R); Other specified dorsopathies, sacral and sacrococcygeal region; Sacroiliac joint dysfunction (Right); Osteoarthritis of sacroiliac joint (Right); Somatic dysfunction of sacroiliac joint (Right); and Chronic musculoskeletal pain on their problem list. Her primarily concern today is the Back Pain (lower right is worse )  Pain Assessment: Location: Lower, Right Back Radiating: into the right hip, leg and foot  Onset: More than a month ago Duration: Chronic pain Quality: Discomfort, Constant, Aching Severity: 7 /10 (subjective, self-reported pain score)  Note: Reported level is compatible with observation. Clinically the patient looks like a 2/10 A 2/10 is viewed as "Mild to Moderate" and described as noticeable and distracting. Impossible to hide from other people. More frequent flare-ups. Still possible to adapt and function close to normal. It can be very annoying and may have occasional stronger flare-ups. With discipline, patients may get used to it and adapt. Discrepancy may suggest symptom exaggeration. When using our objective Pain Scale, levels between 6 and  10/10 are said to belong in an emergency room, as it progressively worsens from a 6/10, described as severely limiting, requiring emergency care not usually available at an outpatient pain management facility. At a 6/10 level, communication becomes difficult and requires great effort. Assistance to reach the emergency department may be required. Facial flushing and profuse sweating along with potentially dangerous increases in heart rate and blood pressure  will be evident. Effect on ADL: difficult to get a lot done during the day since back has flared back up  Timing: Constant Modifying factors: medications.  laying down after pain med will give her relief BP: 135/76  HR: 68  Wanda Martinez was last seen on 10/24/2018 for a procedure. During today's appointment we reviewed Wanda Martinez's post-procedure results, as well as her outpatient medication regimen.  Further details on both, my assessment(s), as well as the proposed treatment plan, please see below.  Controlled Substance Pharmacotherapy Assessment REMS (Risk Evaluation and Mitigation Strategy)  Analgesic:Hydrocodone/APAP 5/325 one tablet every 8 hours MME/day:'15mg'$ /day.  [name], RN  01/08/2019 12:07 PM  Sign when Signing Visit Nursing Pain Medication Assessment:  Safety precautions to be maintained throughout the outpatient stay will include: orient to surroundings, keep bed in low position, maintain call bell within reach at all times, provide assistance with transfer out of bed and ambulation.  Medication Inspection Compliance: Pill count conducted under aseptic conditions, in front of the patient. Neither the pills nor the bottle was removed from the patient's sight at any time. Once count was completed pills were immediately returned to the patient in their original bottle.  Medication: Hydrocodone/APAP Pill/Patch Count: 21 of 90 pills remain Pill/Patch Appearance: Markings consistent with prescribed medication Bottle  Appearance: Standard pharmacy container. Clearly labeled. Filled Date: 71 / 26 / 2019 Last Medication intake:  Today   Pharmacokinetics: Liberation and absorption (onset of action): WNL Distribution (time to peak effect): WNL Metabolism and excretion (duration of action): WNL         Pharmacodynamics: Desired effects: Analgesia: Ms. Mackel reports >50% benefit. Functional ability: Patient reports that medication allows her to accomplish basic ADLs Clinically meaningful improvement in function (CMIF): Sustained CMIF goals met Perceived effectiveness: Described as relatively effective, allowing for increase in activities of daily living (ADL) Undesirable effects: Side-effects or Adverse reactions: None reported Monitoring: [place] PMP: Online review of the past 9-monthperiod conducted. Compliant with practice rules and regulations Last UDS on record: Summary  Date Value Ref Range Status  07/02/2017 FINAL  Final    Comment:    ==================================================================== TOXASSURE SELECT 13 (MW) ==================================================================== Test                             Result       Flag       Units Drug Present and Declared for Prescription Verification   Hydrocodone                    597          EXPECTED   ng/mg creat   Dihydrocodeine                 245          EXPECTED   ng/mg creat   Norhydrocodone                 1486         EXPECTED   ng/mg creat    Sources of hydrocodone include scheduled prescription    medications. Dihydrocodeine and norhydrocodone are expected    metabolites of hydrocodone. Dihydrocodeine is also available as a    scheduled prescription medication. ==================================================================== Test                      Result    Flag   Units  Ref Range   Creatinine              119              mg/dL       >=20 ==================================================================== Declared Medications:  The flagging and interpretation on this report are based on the  following declared medications.  Unexpected results may arise from  inaccuracies in the declared medications.  **Note: The testing scope of this panel includes these medications:  Hydrocodone (Hydrocodone-Acetaminophen)  **Note: The testing scope of this panel does not include following  reported medications:  Acetaminophen (Hydrocodone-Acetaminophen)  Amlodipine Besylate  Aspirin  Calcium Carbonate  Carbidopa (Carbidopa/Levodopa)  Citalopram  Cyclobenzaprine  Gabapentin  Glucosamine  Hydrochlorothiazide  Letrozole  Levodopa (Carbidopa/Levodopa)  Losartan (Losartan Potassium)  Lubiprostone  Magnesium Oxide  Meloxicam  Metoprolol  Multivitamin  Omega-3 Fatty Acids  Omeprazole  Pravastatin ==================================================================== For clinical consultation, please call [phone]. ====================================================================    UDS interpretation: Compliant          Medication Assessment Form: Reviewed. Patient indicates being compliant with therapy Treatment compliance: Compliant Risk Assessment Profile: Aberrant behavior: See prior evaluations. None observed or detected today Comorbid factors increasing risk of overdose: See prior notes. No additional risks detected today Opioid risk tool (ORT) (Total Score): 0 Personal History of Substance Abuse (SUD-Substance use disorder):  Alcohol: Negative  Illegal Drugs: Negative  Rx Drugs: Negative  ORT Risk Level calculation: Low Risk Risk of substance use disorder (SUD): Low Opioid Risk Tool - 01/08/19 1110      Family History of Substance Abuse   Alcohol  Negative    Illegal Drugs  Negative    Rx Drugs  Negative      Personal History of Substance Abuse   Alcohol  Negative    Illegal Drugs  Negative     Rx Drugs  Negative      Age   Age between 74-45 years   No      History of Preadolescent Sexual Abuse   History of Preadolescent Sexual Abuse  Negative or Female      Psychological Disease   Psychological Disease  Negative    Depression  Negative      Total Score   Opioid Risk Tool Scoring  0    Opioid Risk Interpretation  Low Risk      ORT Scoring interpretation table:  Score <3 = Low Risk for SUD  Score between 4-7 = Moderate Risk for SUD  Score >8 = High Risk for Opioid Abuse   Risk Mitigation Strategies:  Patient Counseling: Covered Patient-Prescriber Agreement (PPA): Present and active  Notification to other healthcare providers: Done  Pharmacologic Plan: No change in therapy, at this time.             Post-Procedure Assessment  10/24/2018 Procedure:  right-sided sacroiliac joint radiofrequency ablation Pre-procedure pain score:  6/10 Post-procedure pain score: 0/10         Influential Factors: BMI: 30.90 kg/m Intra-procedural challenges: None observed.         Assessment challenges: None detected.              Reported side-effects: None.        Post-procedural adverse reactions or complications: None reported         Sedation: Please see nurses note. When no sedatives are used, the analgesic levels obtained are directly associated to the effectiveness of the local anesthetics. However, when sedation is provided, the level of analgesia obtained  during the initial 1 hour following the intervention, is believed to be the result of a combination of factors. These factors may include, but are not limited to: 1. The effectiveness of the local anesthetics used. 2. The effects of the analgesic(s) and/or anxiolytic(s) used. 3. The degree of discomfort experienced by the patient at the time of the procedure. 4. The patients ability and reliability in recalling and recording the events. 5. The presence and influence of possible secondary gains and/or psychosocial  factors. Reported result: Relief experienced during the 1st hour after the procedure: 100 % (Ultra-Short Term Relief)            Interpretative annotation: Clinically appropriate result. Analgesia during this period is likely to be Local Anesthetic and/or IV Sedative (Analgesic/Anxiolytic) related.          Effects of local anesthetic: The analgesic effects attained during this period are directly associated to the localized infiltration of local anesthetics and therefore cary significant diagnostic value as to the etiological location, or anatomical origin, of the pain. Expected duration of relief is directly dependent on the pharmacodynamics of the local anesthetic used. Long-acting (4-6 hours) anesthetics used.  Reported result: Relief during the next 4 to 6 hour after the procedure: 100 % (Short-Term Relief)            Interpretative annotation: Clinically appropriate result. Analgesia during this period is likely to be Local Anesthetic-related.          Long-term benefit: Defined as the period of time past the expected duration of local anesthetics (1 hour for short-acting and 4-6 hours for long-acting). With the possible exception of prolonged sympathetic blockade from the local anesthetics, benefits during this period are typically attributed to, or associated with, other factors such as analgesic sensory neuropraxia, antiinflammatory effects, or beneficial biochemical changes provided by agents other than the local anesthetics.  Reported result: Extended relief following procedure: 100 %(good relief up until the Christmas holidays) (Long-Term Relief)            Interpretative annotation: Clinically possible results. Good relief. No permanent benefit expected. Inflammation plays a part in the etiology to the pain.          Current benefits: Defined as reported results that persistent at this point in time.   Analgesia: <50 %            Function: Back to baseline ROM: Back to  baseline Interpretative annotation: Recurrence of symptoms.                Interpretation: Results would suggest therapy to have a limited impact on the patient's condition.                maybe related to overactivity  Plan:  Please see "Plan of Care" for details.                Laboratory Chemistry  Inflammation Markers (CRP: Acute Phase) (ESR: Chronic Phase) Lab Results  Component Value Date   CRP 3 07/17/2018   ESRSEDRATE 27 07/17/2018                         Rheumatology Markers No results found for: RF, ANA, LABURIC, URICUR, LYMEIGGIGMAB, LYMEABIGMQN, HLAB27                      Renal Function Markers Lab Results  Component Value Date   BUN 18 09/25/2018   CREATININE 1.03 09/25/2018  GFRAA >60 10/16/2017   GFRNONAA 58 (L) 10/16/2017                             Hepatic Function Markers Lab Results  Component Value Date   AST 17 07/09/2018   ALT 25 07/09/2018   ALBUMIN 4.0 07/09/2018   ALKPHOS 54 07/09/2018                        Electrolytes Lab Results  Component Value Date   NA 135 09/25/2018   K 4.3 09/25/2018   CL 99 09/25/2018   CALCIUM 9.7 09/25/2018   MG 2.2 07/17/2018                        Neuropathy Markers Lab Results  Component Value Date   VITAMINB12 1,235 07/17/2018                        CNS Tests No results found for: COLORCSF, APPEARCSF, RBCCOUNTCSF, WBCCSF, POLYSCSF, LYMPHSCSF, EOSCSF, PROTEINCSF, GLUCCSF, JCVIRUS, CSFOLI, IGGCSF                      Bone Pathology Markers Lab Results  Component Value Date   25OHVITD1 42 07/17/2018   25OHVITD2 <1.0 07/17/2018   25OHVITD3 42 07/17/2018                         Coagulation Parameters Lab Results  Component Value Date   PLT 432.0 (H) 07/09/2018                        Cardiovascular Markers Lab Results  Component Value Date   TROPONINI < 0.02 10/09/2014   HGB 11.8 (L) 07/09/2018   HCT 34.9 (L) 07/09/2018                         CA Markers Lab Results  Component Value  Date   LABCA2 24.5 08/07/2013                        Note: Lab results reviewed.  Recent Diagnostic Imaging Results  DG C-Arm 1-60 Min-No Report Fluoroscopy was utilized by the requesting physician.  No radiographic  interpretation.   Complexity Note: Imaging results reviewed. Results shared with Wanda Martinez, using Layman's terms.                         Meds   Current Outpatient Medications:  .  acyclovir ointment (ZOVIRAX) 5 %, Apply 1 application topically 4 (four) times daily., Disp: 15 g, Rfl: 0 .  amLODipine (NORVASC) 2.5 MG tablet, TAKE 1 TABLET BY MOUTH ONCE DAILY., Disp: 30 tablet, Rfl: 5 .  aspirin 81 MG EC tablet, Take 81 mg by mouth daily as needed.  , Disp: , Rfl:  .  Calcium Carbonate (CALCIUM 600) 1500 MG TABS, Take 1 tablet by mouth daily.  , Disp: , Rfl:  .  citalopram (CELEXA) 20 MG tablet, TAKE 1 & 1/2 TABLETS BY MOUTH ONCE DAILY, Disp: , Rfl:  .  cyclobenzaprine (FLEXERIL) 10 MG tablet, TAKE ONE TABLET BY MOUTH AT BEDTIME., Disp: 30 tablet, Rfl: 5 .  gabapentin (NEURONTIN) 100 MG capsule, Take 200 mg by mouth 4 (four) times daily. ,  Disp: , Rfl: 1 .  GLUCOSAMINE SULFATE PO, Take by mouth daily. , Disp: , Rfl:  .  hydrochlorothiazide (HYDRODIURIL) 25 MG tablet, Take 0.5 tablets by mouth daily., Disp: , Rfl: 10 .  HYDROcodone-acetaminophen (NORCO/VICODIN) 5-325 MG tablet, Take 1 tablet by mouth every 8 (eight) hours as needed for severe pain., Disp: 90 tablet, Rfl: 0 .  letrozole (FEMARA) 2.5 MG tablet, Take 2.5 mg by mouth daily., Disp: , Rfl:  .  losartan (COZAAR) 100 MG tablet, TAKE 1 TABLET BY MOUTH ONCE DAILY., Disp: 30 tablet, Rfl: 5 .  lubiprostone (AMITIZA) 24 MCG capsule, TAKE 1 CAPSULE BY MOUTH ONCE DAILY WITH BREAKFAST., Disp: , Rfl:  .  magnesium oxide (MAG-OX) 400 (241.3 Mg) MG tablet, TAKE 1 TABLET BY MOUTH ONCE DAILY., Disp: 30 tablet, Rfl: 5 .  meloxicam (MOBIC) 7.5 MG tablet, TAKE 1 TABLET BY MOUTH ONCE DAILY., Disp: 30 tablet, Rfl: 5 .  metoprolol  succinate (TOPROL-XL) 25 MG 24 hr tablet, TAKE 1 TABLET BY MOUTH TWICE DAILY, Disp: 60 tablet, Rfl: 10 .  Multiple Vitamin (MULTIVITAMIN) tablet, Take 1 tablet by mouth daily., Disp: , Rfl:  .  Omega-3 1000 MG CAPS, Take by mouth daily. , Disp: , Rfl:  .  omeprazole (PRILOSEC) 20 MG capsule, TAKE 1 CAPSULE BY MOUTH TWICE DAILY FOR REFLUX., Disp: 60 capsule, Rfl: 1 .  pantoprazole (PROTONIX) 40 MG tablet, TAKE 1 TABLET BY MOUTH ONCE DAILY., Disp: 30 tablet, Rfl: 5 .  pravastatin (PRAVACHOL) 20 MG tablet, TAKE 1 TABLET BY MOUTH ONCE DAILY., Disp: 30 tablet, Rfl: 5 .  [START ON 03/12/2019] HYDROcodone-acetaminophen (NORCO/VICODIN) 5-325 MG tablet, Take 1 tablet by mouth every 8 (eight) hours as needed for up to 30 days for severe pain., Disp: 90 tablet, Rfl: 0 .  [START ON 02/10/2019] HYDROcodone-acetaminophen (NORCO/VICODIN) 5-325 MG tablet, Take 1 tablet by mouth every 8 (eight) hours as needed for up to 30 days for severe pain., Disp: 90 tablet, Rfl: 0  ROS  Constitutional: Denies any fever or chills Gastrointestinal: No reported hemesis, hematochezia, vomiting, or acute GI distress Musculoskeletal: Denies any acute onset joint swelling, redness, loss of ROM, or weakness Neurological: No reported episodes of acute onset apraxia, aphasia, dysarthria, agnosia, amnesia, paralysis, loss of coordination, or loss of consciousness  Allergies  Wanda Martinez is allergic to sulfa antibiotics and vesicare [solifenacin].  [place]  Drug: Wanda Martinez  reports no history of drug use. Alcohol:  reports no history of alcohol use. Tobacco:  reports that she has never smoked. She has never used smokeless tobacco. Medical:  has a past medical history of Acute postoperative pain (10/25/2017), Anemia, Arm pain (07/26/2015), Arthritis, Arthritis, degenerative (03/26/2014), Back pain (11/01/2013), Bladder infection (06/2018), Breast cancer ([place]), Breast cancer ([place]), CHEST PAIN (04/29/2010), Chronic cystitis, Cystocele (02/22/2013),  Cystocele, midline (08/19/2013), Degeneration of intervertebral disc of lumbosacral region (03/26/2014), DYSPNEA (04/29/2010), Enthesopathy of hip (03/26/2014), GERD (gastroesophageal reflux disease), Hiatal hernia, HTN (hypertension), Hypokalemia (06/2018), Hyponatremia (06/2018), LBP (low back pain) (03/26/2014), Neck pain (11/01/2013), Parkinson disease ([place]), Pneumonia (06/2018), Sinusitis (02/07/2015), Skin lesions (07/12/2014), and Urinary incontinence. Surgical: Wanda Martinez  has a past surgical history that includes Breast biopsy (2013); Tonsillectomy and adenoidectomy (81); Vesicovaginal fistula closure w/ TAH (1983); Breast surgery (1986); Breast enhancement surgery (1987); Breast implant removal; Breast implant removal (Right, 08/29/2012); Mastectomy (08/2012); Abdominal hysterectomy; and Colonoscopy with propofol (N/A, 09/13/2016). Family: family history includes Colon polyps in her father; Diabetes in her father; Parkinson's disease in her mother; Stroke in her father  and mother.  Constitutional Exam  General appearance: alert, cooperative and in mild distress Vitals:   01/08/19 1059  BP: 135/76  Pulse: 68  Resp: 16  Temp: 98.2 F (36.8 C)  TempSrc: Oral  SpO2: 98%  Weight: 180 lb (81.6 kg)  Height: '5\' 4"'$  (1.626 m)  Psych/Mental status: Alert, oriented x 3 (person, place, & time)       Eyes: PERLA Respiratory: No evidence of acute respiratory distress  Lumbar Spine Area Exam  Skin & Axial Inspection: No masses, redness, or swelling Alignment: Symmetrical Functional ROM: Unrestricted ROM       Stability: No instability detected Muscle Tone/Strength: Functionally intact. No obvious neuro-muscular anomalies detected. Sensory (Neurological): Unimpaired Palpation: Complains of area being tender to palpation       Provocative Tests: Hyperextension/rotation test: deferred today       Lumbar quadrant test (Kemp's test): deferred today       Lateral bending test: deferred today       Patrick's  Maneuver: deferred today                    Gait & Posture Assessment  Ambulation: Unassisted Gait: Relatively normal for age and body habitus Posture: WNL   Lower Extremity Exam    Side: Right lower extremity  Side: Left lower extremity  Stability: No instability observed          Stability: No instability observed          Skin & Extremity Inspection: Skin color, temperature, and hair growth are WNL. No peripheral edema or cyanosis. No masses, redness, swelling, asymmetry, or associated skin lesions. No contractures.  Skin & Extremity Inspection: Skin color, temperature, and hair growth are WNL. No peripheral edema or cyanosis. No masses, redness, swelling, asymmetry, or associated skin lesions. No contractures.  Functional ROM: Unrestricted ROM                  Functional ROM: Unrestricted ROM                  Muscle Tone/Strength: Functionally intact. No obvious neuro-muscular anomalies detected.  Muscle Tone/Strength: Functionally intact. No obvious neuro-muscular anomalies detected.  Sensory (Neurological): Unimpaired        Sensory (Neurological): Unimpaired            Palpation: No palpable anomalies  Palpation: No palpable anomalies   Assessment  Primary Diagnosis & Pertinent Problem List: The primary encounter diagnosis was Lumbar spondylosis. Diagnoses of Chronic musculoskeletal pain, Cervical spondylosis, Chronic pain syndrome, Neurogenic pain, Pharmacologic therapy, and Long term prescription opiate use were also pertinent to this visit.  Status Diagnosis  Having a Flare-up Worsening Controlled 1. Lumbar spondylosis   2. Chronic musculoskeletal pain   3. Cervical spondylosis   4. Chronic pain syndrome   5. Neurogenic pain   6. Pharmacologic therapy   7. Long term prescription opiate use     Problems updated and reviewed during this visit: Problem  Chronic Musculoskeletal Pain   Plan of Care  Pharmacotherapy (Medications Ordered): Meds ordered this encounter   Medications  . HYDROcodone-acetaminophen (NORCO/VICODIN) 5-325 MG tablet    Sig: Take 1 tablet by mouth every 8 (eight) hours as needed for up to 30 days for severe pain.    Dispense:  90 tablet    Refill:  0    Do not add this medication to the electronic "Automatic Refill" notification system. Patient may have prescription filled one day early if pharmacy is  closed on scheduled refill date.    Order Specific Question:   Supervising Provider    Answer:   Milinda Pointer [phone]  . HYDROcodone-acetaminophen (NORCO/VICODIN) 5-325 MG tablet    Sig: Take 1 tablet by mouth every 8 (eight) hours as needed for up to 30 days for severe pain.    Dispense:  90 tablet    Refill:  0    Do not add this medication to the electronic "Automatic Refill" notification system. Patient may have prescription filled one day early if pharmacy is closed on scheduled refill date.    Order Specific Question:   Supervising Provider    Answer:   Milinda Pointer [phone]  . ketorolac (TORADOL) injection 60 mg   New Prescriptions   No medications on file   Medications administered today: We administered ketorolac. Lab-work, procedure(s), and/or referral(s): Orders Placed This Encounter  Procedures  . ToxASSURE Select 13 (MW), Urine  . Comp. Metabolic Panel (12)   Imaging and/or referral(s): None  Interventional management options: Planned, scheduled, and/or pending: Not at this time.   Considering: Diagnosticright-sided lumbar facet block  Palliative right-sided lumbar facet radiofrequency ablation  PalliativeRight suprascapular muscle trigger point injection  Diagnosticbilateral cervical facet block  Possiblebilateral cervical facet radiofrequency ablation  Diagnostic right-sided cervical epidural steroid injection  Diagnosticintra-articular hip joint injection  Possible hip joint radiofrequency ablation   Palliative PRN treatment(s): Diagnostic right-sided lumbar facet block   Palliative right-sided lumbar facet RFA #3(last done on06/18/19) Palliative Right suprascapular muscle trigger point injection  Diagnosticbilateral cervical facet block  Diagnosticright-sided cervical epidural steroid injection Diagnosticintra-articular hip joint injection Palliativeright-sided L3-4 interlaminar LESI#3 Palliative right-sided L5 transforaminal LESI #3    Provider-requested follow-up: Return in about 3 months (around 04/09/2019) for MedMgmt.  Future Appointments  Date Time Provider [place]  04/07/2019 11:15 AM Vevelyn Francois, NP [facility] None  04/08/2019 11:15 AM Lloyd Huger, MD [facility] None  04/10/2019  3:00 PM Einar Pheasant, MD [facility]  07/10/2019  9:30 AM Einar Pheasant, MD [facility]  07/10/2019 10:00 AM O'Brien-Blaney, Bryson Corona, LPN [facility]   Primary Care Physician: Einar Pheasant, MD Location: [facility] Note by: Vevelyn Francois NP Date: 01/08/2019; Time: 2:47 PM  Pain Score Disclaimer: We use the NRS-11 scale. This is a self-reported, subjective measurement of pain severity with only modest accuracy. It is used primarily to identify changes within a particular patient. It must be understood that outpatient pain scales are significantly less accurate that those used for research, where they can be applied under ideal controlled circumstances with minimal exposure to variables. In reality, the score is likely to be a combination of pain intensity and pain affect, where pain affect describes the degree of emotional arousal or changes in action readiness caused by the sensory experience of pain. Factors such as social and work situation, setting, emotional state, anxiety levels, expectation, and prior pain experience may influence pain perception and show large inter-individual differences that may also be affected by time variables.  Patient instructions provided during this  appointment: Patient Instructions   ____________________________________________________________________________________________  Medication Rules  Purpose: To inform patients, and their family members, of our rules and regulations.  Applies to: All patients receiving prescriptions (written or electronic).  Pharmacy of record: Pharmacy where electronic prescriptions will be sent. If written prescriptions are taken to a different pharmacy, please inform the nursing staff. The pharmacy listed in the electronic medical record should be the one where you would like electronic  prescriptions to be sent.  Electronic prescriptions: In compliance with the [employer] (STOP) Act of 2017 (Session Lanny Cramp [phone]), effective December 18, 2018, all controlled substances must be electronically prescribed. Calling prescriptions to the pharmacy will cease to exist.  Prescription refills: Only during scheduled appointments. Applies to all prescriptions.  NOTE: The following applies primarily to controlled substances (Opioid* Pain Medications).   Patient's responsibilities: 1. Pain Pills: Bring all pain pills to every appointment (except for procedure appointments). 2. Pill Bottles: Bring pills in original pharmacy bottle. Always bring the newest bottle. Bring bottle, even if empty. 3. Medication refills: You are responsible for knowing and keeping track of what medications you take and those you need refilled. The day before your appointment: write a list of all prescriptions that need to be refilled. The day of the appointment: give the list to the admitting nurse. Prescriptions will be written only during appointments. If you forget a medication: it will not be "Called in", "Faxed", or "electronically sent". You will need to get another appointment to get these prescribed. No early refills. Do not call asking to have your prescription filled early. 4. Prescription  Accuracy: You are responsible for carefully inspecting your prescriptions before leaving our office. Have the discharge nurse carefully go over each prescription with you, before taking them home. Make sure that your name is accurately spelled, that your address is correct. Check the name and dose of your medication to make sure it is accurate. Check the number of pills, and the written instructions to make sure they are clear and accurate. Make sure that you are given enough medication to last until your next medication refill appointment. 5. Taking Medication: Take medication as prescribed. When it comes to controlled substances, taking less pills or less frequently than prescribed is permitted and encouraged. Never take more pills than instructed. Never take medication more frequently than prescribed.  6. Inform other Doctors: Always inform, all of your healthcare providers, of all the medications you take. 7. Pain Medication from other Providers: You are not allowed to accept any additional pain medication from any other Doctor or Healthcare provider. There are two exceptions to this rule. (see below) In the event that you require additional pain medication, you are responsible for notifying us, as stated below. 8. Medication Agreement: You are responsible for carefully reading and following our Medication Agreement. This must be signed before receiving any prescriptions from our practice. Safely store a copy of your signed Agreement. Violations to the Agreement will result in no further prescriptions. (Additional copies of our Medication Agreement are available upon request.) 9. Laws, Rules, & Regulations: All patients are expected to follow all Federal and [employer], Rules, [employer]. Ignorance of the Laws does not constitute a valid excuse. The use of any illegal substances is prohibited. 10. Adopted CDC guidelines & recommendations: Target dosing levels will be at or below 60 MME/day.  Use of benzodiazepines** is not recommended.  Exceptions: There are only two exceptions to the rule of not receiving pain medications from other Healthcare Providers. 1. Exception #1 (Emergencies): In the event of an emergency (i.e.: accident requiring emergency care), you are allowed to receive additional pain medication. However, you are responsible for: As soon as you are able, call our office [phone], at any time of the day or night, and leave a message stating your name, the date and nature of the emergency, and the name and dose of the medication prescribed. In  the event that your call is answered by a member of our staff, make sure to document and save the date, time, and the name of the person that took your information.  2. Exception #2 (Planned Surgery): In the event that you are scheduled by another doctor or dentist to have any type of surgery or procedure, you are allowed (for a period no longer than 30 days), to receive additional pain medication, for the acute post-op pain. However, in this case, you are responsible for picking up a copy of our "Post-op Pain Management for Surgeons" handout, and giving it to your surgeon or dentist. This document is available at our office, and does not require an appointment to obtain it. Simply go to our office during business hours (Monday-Thursday from 8:00 AM to 4:00 PM) (Friday 8:00 AM to 12:00 Noon) or if you have a scheduled appointment with [place], prior to your surgery, and ask for it by name. In addition, you will need to provide [place] with your name, name of your surgeon, type of surgery, and date of procedure or surgery.  *Opioid medications include: morphine, codeine, oxycodone, oxymorphone, hydrocodone, hydromorphone, meperidine, tramadol, tapentadol, buprenorphine, fentanyl, methadone. **Benzodiazepine medications include: diazepam (Valium), alprazolam (Xanax), clonazepam (Klonopine), lorazepam (Ativan), clorazepate (Tranxene), chlordiazepoxide  (Librium), estazolam (Prosom), oxazepam (Serax), temazepam (Restoril), triazolam (Halcion) (Last updated: 02/14/2018) ____________________________________________________________________________________________   BMI Assessment: Estimated body mass index is 30.9 kg/m as calculated from the following:   Height as of this encounter: '5\' 4"'$  (1.626 m).   Weight as of this encounter: 180 lb (81.6 kg).  BMI interpretation table: BMI level Category Range association with higher incidence of chronic pain  <18 kg/m2 Underweight   18.5-24.9 kg/m2 Ideal body weight   25-29.9 kg/m2 Overweight Increased incidence by 20%  30-34.9 kg/m2 Obese (Class I) Increased incidence by 68%  35-39.9 kg/m2 Severe obesity (Class II) Increased incidence by 136%  >40 kg/m2 Extreme obesity (Class III) Increased incidence by 254%   Patient's current BMI Ideal Body weight  Body mass index is 30.9 kg/m. Ideal body weight: 54.7 kg (120 lb 9.5 oz) Adjusted ideal body weight: 65.5 kg (144 lb 5.7 oz)   BMI Readings from Last 4 Encounters:  01/08/19 30.90 kg/m  10/24/18 30.90 kg/m  10/17/18 30.90 kg/m  10/09/18 30.90 kg/m   Wt Readings from Last 4 Encounters:  01/08/19 180 lb (81.6 kg)  10/24/18 180 lb (81.6 kg)  10/17/18 180 lb (81.6 kg)  10/09/18 180 lb (81.6 kg)

## 2019-01-08 NOTE — Progress Notes (Signed)
Nursing Pain Medication Assessment:  Safety precautions to be maintained throughout the outpatient stay will include: orient to surroundings, keep bed in low position, maintain call bell within reach at all times, provide assistance with transfer out of bed and ambulation.  Medication Inspection Compliance: Pill count conducted under aseptic conditions, in front of the patient. Neither the pills nor the bottle was removed from the patient's sight at any time. Once count was completed pills were immediately returned to the patient in their original bottle.  Medication: Hydrocodone/APAP Pill/Patch Count: 21 of 90 pills remain Pill/Patch Appearance: Markings consistent with prescribed medication Bottle Appearance: Standard pharmacy container. Clearly labeled. Filled Date: 73 / 26 / 2019 Last Medication intake:  Today

## 2019-01-09 LAB — COMP. METABOLIC PANEL (12)
AST: 28 IU/L (ref 0–40)
Albumin/Globulin Ratio: 1.7 (ref 1.2–2.2)
Albumin: 4.5 g/dL (ref 3.6–4.6)
Alkaline Phosphatase: 68 IU/L (ref 39–117)
BUN / CREAT RATIO: 12 (ref 12–28)
BUN: 14 mg/dL (ref 8–27)
Bilirubin Total: 0.4 mg/dL (ref 0.0–1.2)
Calcium: 9.8 mg/dL (ref 8.7–10.3)
Chloride: 99 mmol/L (ref 96–106)
Creatinine, Ser: 1.16 mg/dL — ABNORMAL HIGH (ref 0.57–1.00)
GFR calc Af Amer: 51 mL/min/{1.73_m2} — ABNORMAL LOW (ref >59)
GFR calc non Af Amer: 44 mL/min/{1.73_m2} — ABNORMAL LOW (ref >59)
GLUCOSE: 90 mg/dL (ref 65–99)
Globulin, Total: 2.6 g/dL (ref 1.5–4.5)
Potassium: 4.5 mmol/L (ref 3.5–5.2)
Sodium: 137 mmol/L (ref 134–144)
Total Protein: 7.1 g/dL (ref 6.0–8.5)

## 2019-01-13 ENCOUNTER — Encounter: Payer: Medicare Other | Admitting: Internal Medicine

## 2019-01-14 LAB — TOXASSURE SELECT 13 (MW), URINE

## 2019-02-10 DIAGNOSIS — R51 Headache: Secondary | ICD-10-CM | POA: Diagnosis not present

## 2019-02-10 DIAGNOSIS — R413 Other amnesia: Secondary | ICD-10-CM | POA: Insufficient documentation

## 2019-02-10 DIAGNOSIS — I10 Essential (primary) hypertension: Secondary | ICD-10-CM | POA: Diagnosis not present

## 2019-02-10 DIAGNOSIS — R519 Headache, unspecified: Secondary | ICD-10-CM | POA: Insufficient documentation

## 2019-02-10 DIAGNOSIS — M545 Low back pain: Secondary | ICD-10-CM | POA: Diagnosis not present

## 2019-02-10 DIAGNOSIS — G8929 Other chronic pain: Secondary | ICD-10-CM | POA: Diagnosis not present

## 2019-02-10 DIAGNOSIS — F329 Major depressive disorder, single episode, unspecified: Secondary | ICD-10-CM | POA: Diagnosis not present

## 2019-02-10 DIAGNOSIS — M25511 Pain in right shoulder: Secondary | ICD-10-CM | POA: Diagnosis not present

## 2019-02-10 DIAGNOSIS — M542 Cervicalgia: Secondary | ICD-10-CM | POA: Diagnosis not present

## 2019-02-10 DIAGNOSIS — Z79899 Other long term (current) drug therapy: Secondary | ICD-10-CM | POA: Diagnosis not present

## 2019-02-10 DIAGNOSIS — R251 Tremor, unspecified: Secondary | ICD-10-CM | POA: Diagnosis not present

## 2019-02-13 ENCOUNTER — Encounter: Payer: Self-pay | Admitting: Pain Medicine

## 2019-02-13 ENCOUNTER — Other Ambulatory Visit: Payer: Self-pay

## 2019-02-13 ENCOUNTER — Ambulatory Visit (HOSPITAL_BASED_OUTPATIENT_CLINIC_OR_DEPARTMENT_OTHER): Payer: Medicare Other | Admitting: Pain Medicine

## 2019-02-13 ENCOUNTER — Ambulatory Visit
Admission: RE | Admit: 2019-02-13 | Discharge: 2019-02-13 | Disposition: A | Discharge status: Home / Self Care | Payer: Medicare Other | Source: Ambulatory Visit | Attending: Pain Medicine | Admitting: Pain Medicine

## 2019-02-13 VITALS — BP 182/75 | HR 72 | Temp 98.4°F | Resp 13 | Ht 64.0 in | Wt 180.0 lb

## 2019-02-13 DIAGNOSIS — M25551 Pain in right hip: Secondary | ICD-10-CM | POA: Diagnosis not present

## 2019-02-13 DIAGNOSIS — M79604 Pain in right leg: Secondary | ICD-10-CM | POA: Diagnosis not present

## 2019-02-13 DIAGNOSIS — M1611 Unilateral primary osteoarthritis, right hip: Secondary | ICD-10-CM | POA: Diagnosis not present

## 2019-02-13 DIAGNOSIS — G8929 Other chronic pain: Secondary | ICD-10-CM | POA: Insufficient documentation

## 2019-02-13 DIAGNOSIS — M5137 Other intervertebral disc degeneration, lumbosacral region: Secondary | ICD-10-CM | POA: Insufficient documentation

## 2019-02-13 DIAGNOSIS — M545 Low back pain, unspecified: Secondary | ICD-10-CM

## 2019-02-13 DIAGNOSIS — M4807 Spinal stenosis, lumbosacral region: Secondary | ICD-10-CM | POA: Diagnosis not present

## 2019-02-13 MED ORDER — IOPAMIDOL (ISOVUE-M 200) INJECTION 41%
INTRAMUSCULAR | Status: AC
  Filled 2019-02-13: qty 10

## 2019-02-13 MED ORDER — LIDOCAINE HCL 2 % IJ SOLN
INTRAMUSCULAR | Status: AC
  Filled 2019-02-13: qty 20

## 2019-02-13 MED ORDER — ROPIVACAINE HCL 2 MG/ML IJ SOLN
INTRAMUSCULAR | Status: AC
  Filled 2019-02-13: qty 10

## 2019-02-13 MED ORDER — METHYLPREDNISOLONE ACETATE 80 MG/ML IJ SUSP
INTRAMUSCULAR | Status: AC
  Filled 2019-02-13: qty 1

## 2019-02-13 MED ORDER — SODIUM CHLORIDE (PF) 0.9 % IJ SOLN
INTRAMUSCULAR | Status: AC
  Filled 2019-02-13: qty 10

## 2019-02-13 MED ORDER — IOPAMIDOL (ISOVUE-M 200) INJECTION 41%
10.0000 mL | Freq: Once | INTRAMUSCULAR | Status: AC
  Administered 2019-02-13: 10 mL via EPIDURAL

## 2019-02-13 MED ORDER — METHYLPREDNISOLONE ACETATE 80 MG/ML IJ SUSP
80.0000 mg | Freq: Once | INTRAMUSCULAR | Status: AC
  Administered 2019-02-13: 80 mg via INTRA_ARTICULAR

## 2019-02-13 MED ORDER — LIDOCAINE HCL 2 % IJ SOLN
20.0000 mL | Freq: Once | INTRAMUSCULAR | Status: AC
  Administered 2019-02-13: 400 mg

## 2019-02-13 MED ORDER — LACTATED RINGERS IV SOLN
1000.0000 mL | Freq: Once | INTRAVENOUS | Status: AC
  Administered 2019-02-13: 1000 mL via INTRAVENOUS

## 2019-02-13 MED ORDER — MIDAZOLAM HCL 5 MG/5ML IJ SOLN
1.0000 mg | INTRAMUSCULAR | Status: AC | PRN
  Administered 2019-02-13: 3 mg via INTRAVENOUS

## 2019-02-13 MED ORDER — ROPIVACAINE HCL 2 MG/ML IJ SOLN
2.0000 mL | Freq: Once | INTRAMUSCULAR | Status: AC
  Administered 2019-02-13: 2 mL via EPIDURAL

## 2019-02-13 MED ORDER — FENTANYL CITRATE (PF) 100 MCG/2ML IJ SOLN
INTRAMUSCULAR | Status: AC
  Filled 2019-02-13: qty 2

## 2019-02-13 MED ORDER — ROPIVACAINE HCL 2 MG/ML IJ SOLN
9.0000 mL | Freq: Once | INTRAMUSCULAR | Status: AC
  Administered 2019-02-13: 9 mL via INTRA_ARTICULAR

## 2019-02-13 MED ORDER — TRIAMCINOLONE ACETONIDE 40 MG/ML IJ SUSP
40.0000 mg | Freq: Once | INTRAMUSCULAR | Status: AC
  Administered 2019-02-13: 40 mg

## 2019-02-13 MED ORDER — FENTANYL CITRATE (PF) 100 MCG/2ML IJ SOLN
25.0000 ug | INTRAMUSCULAR | Status: AC | PRN
  Administered 2019-02-13: 50 ug via INTRAVENOUS

## 2019-02-13 MED ORDER — TRIAMCINOLONE ACETONIDE 40 MG/ML IJ SUSP
INTRAMUSCULAR | Status: AC
  Filled 2019-02-13: qty 1

## 2019-02-13 MED ORDER — SODIUM CHLORIDE 0.9% FLUSH
2.0000 mL | Freq: Once | INTRAVENOUS | Status: AC
  Administered 2019-02-13: 2 mL

## 2019-02-13 MED ORDER — MIDAZOLAM HCL 5 MG/5ML IJ SOLN
INTRAMUSCULAR | Status: AC
  Filled 2019-02-13: qty 5

## 2019-02-13 NOTE — Progress Notes (Signed)
Safety precautions to be maintained throughout the outpatient stay will include: orient to surroundings, keep bed in low position, maintain call bell within reach at all times, provide assistance with transfer out of bed and ambulation.  

## 2019-02-13 NOTE — Patient Instructions (Signed)

## 2019-02-13 NOTE — Progress Notes (Signed)
Patient's Name: Wanda Martinez  MRN: [id number]  Referring Provider: Einar Pheasant, MD  DOB: 09/04/1935  PCP: Einar Pheasant, MD  DOS: 02/13/2019  Note by: Gaspar Cola, MD  Service setting: Ambulatory outpatient  Specialty: Interventional Pain Management  Patient type: Established  Location: [facility] (AMB) Pain Management Facility  Visit type: Interventional Procedure   Primary Reason for Visit: Interventional Pain Management Treatment. CC: Back Pain (low and right); Hip Pain (right); and Leg Pain (right)  Procedure #1:  Anesthesia, Analgesia, Anxiolysis:  Type: Intra-Articular Hip Injection #1  Primary Purpose: Diagnostic Region: Posterolateral hip joint area. Level: Lower pelvic and hip joint level. Target Area: Superior aspect of the hip joint cavity, going thru the superior portion of the capsular ligament. Approach: Posterolateral approach. Laterality: Right-Sided  Type: Moderate (Conscious) Sedation combined with Local Anesthesia Indication(s): Analgesia and Anxiety Route: Intravenous (IV) IV Access: Secured Sedation: Meaningful verbal contact was maintained at all times during the procedure  Local Anesthetic: Lidocaine 1-2%  Position: Lateral Decubitus with bad side up Prepped Area: Entire Posterolateral hip area. Prepping solution: ChloraPrep (2% chlorhexidine gluconate and 70% isopropyl alcohol)   Indications: 1. Chronic hip pain (Right)   2. Osteoarthritis of hip (Right)    Procedure #2:  Anesthesia, Analgesia, Anxiolysis:  Type: Diagnostic Epidural Steroid Injection          Region: Caudal Level: Sacrococcygeal   Laterality: Midline aiming at the right  Type: Moderate (Conscious) Sedation combined with Local Anesthesia Indication(s): Analgesia and Anxiety Route: Intravenous (IV) IV Access: Secured Sedation: Meaningful verbal contact was maintained at all times during the procedure  Local Anesthetic: Lidocaine 1-2%  Position: Prone   Indications: 1.  DDD (degenerative disc disease), lumbosacral   2. 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 Chronic low back pain (Primary Source of Pain) (Bilateral) (R>L)   4. Chronic lower extremity pain (Secondary Area of Pain) (Right)    Pain Score: Pre-procedure: 8 /10 Post-procedure: 0-No pain/10  The patient comes into the clinic today complaining of pain down the right lower extremity starting in the buttocks area.  She does have some discomfort around the sacral area and around the PSIS area on the right side.  When she stands up and puts pressure on it the pain worsens.  Physical exam today was significant for right-sided hip arthralgia.  However, she has some pain that does not seem to go along with the hip arthralgia.  This pain follows an S2 distribution.  The plan today is to go ahead and do the left intra-articular hip joint injection and see how much of the pain goes away.  Once I complete the hip injection, I will give it approximately 15 minutes for the local anesthetic to take effect.  After that the plan is to have the patient stand up and try to move around to see what pain has gone away and what pain did not.  If she continues to have pain down the lower extremity, and does not get better with the hip block, then I may have to do an epidural steroid injection for her, through the caudal canal.  Lumbar Spine Area Exam  Skin & Axial Inspection: No masses, redness, or swelling Alignment: Symmetrical Functional ROM: Restricted ROM affecting both sides Stability: No instability detected Muscle Tone/Strength: Guarding detected Sensory (Neurological): Movement-associated discomfort Palpation: Complains of area being tender to palpation around the area of the right PSIS. Provocative Tests: Hyperextension/rotation test: Equivocal bilaterally for facet joint pain. Lumbar quadrant  test (Kemp's test): deferred today       Lateral bending test: deferred today       Patrick's  Maneuver: (+) for right hip arthralgia             FABER* test: (+) for right hip arthralgia              Lower Extremity Exam    Side: Right lower extremity  Side: Left lower extremity  Stability: No instability observed          Stability: No instability observed          Skin & Extremity Inspection: Skin color, temperature, and hair growth are WNL. No peripheral edema or cyanosis. No masses, redness, swelling, asymmetry, or associated skin lesions. No contractures.  Skin & Extremity Inspection: Skin color, temperature, and hair growth are WNL. No peripheral edema or cyanosis. No masses, redness, swelling, asymmetry, or associated skin lesions. No contractures.  Functional ROM: Decreased ROM for hip and knee joints          Functional ROM: Decreased ROM for hip and knee joints          Muscle Tone/Strength: Functionally intact. No obvious neuro-muscular anomalies detected.  Muscle Tone/Strength: Functionally intact. No obvious neuro-muscular anomalies detected.  Sensory (Neurological): Articular pain pattern at the level of the right hip.  The patient also seems to have a component of this pain that seems to follow an S2 radicular pattern.  Sensory (Neurological): Unimpaired        DTR: Patellar: deferred today Achilles: deferred today Plantar: deferred today  DTR: Patellar: deferred today Achilles: deferred today Plantar: deferred today  Palpation: No palpable anomalies  Palpation: No palpable anomalies   Pre-op Assessment:  Wanda Martinez is a 84 y.o. (year old), female patient, seen today for interventional treatment. She  has a past surgical history that includes Breast biopsy (2013); Tonsillectomy and adenoidectomy (79); Vesicovaginal fistula closure w/ TAH (1983); Breast surgery (1986); Breast enhancement surgery (1987); Breast implant removal; Breast implant removal (Right, 08/29/2012); Mastectomy (08/2012); Abdominal hysterectomy; and Colonoscopy with propofol (N/A, 09/13/2016). Wanda Martinez  has a current medication list which includes the following prescription(s): acyclovir ointment, amlodipine, aspirin, calcium carbonate, citalopram, cyclobenzaprine, gabapentin, glucosamine sulfate, hydrochlorothiazide, hydrocodone-acetaminophen, hydrocodone-acetaminophen, letrozole, losartan, lubiprostone, magnesium oxide, meloxicam, metoprolol succinate, multivitamin, omega-3, omeprazole, pantoprazole, pravastatin, and hydrocodone-acetaminophen, and the following Facility-Administered Medications: fentanyl, lactated ringers, and midazolam. Her primarily concern today is the Back Pain (low and right); Hip Pain (right); and Leg Pain (right)  Initial Vital Signs:  Pulse/HCG Rate: 72ECG Heart Rate: 68 Temp: 98.4 F (36.9 C) Resp: 18 BP: 139/79 SpO2: 94 %  BMI: Estimated body mass index is 30.9 kg/m as calculated from the following:   Height as of this encounter: 5\' 4"  (1.626 m).   Weight as of this encounter: 180 lb (81.6 kg).  Risk Assessment: Allergies: Reviewed. She is allergic to sulfa antibiotics and vesicare [solifenacin].  Allergy Precautions: None required Coagulopathies: Reviewed. None identified.  Blood-thinner therapy: None at this time Active Infection(s): Reviewed. None identified. Ms. Frieson is afebrile  Site Confirmation: Ms. Stankovic was asked to confirm the procedure and laterality before marking the site Procedure checklist: Completed Consent: Before the procedure and under the influence of no sedative(s), amnesic(s), or anxiolytics, the patient was informed of the treatment options, risks and possible complications. To fulfill our ethical and legal obligations, as recommended by the American Medical Association's Code of Ethics, I have informed the patient of my clinical impression; the  nature and purpose of the treatment or procedure; the risks, benefits, and possible complications of the intervention; the alternatives, including doing nothing; the risk(s) and benefit(s) of the  alternative treatment(s) or procedure(s); and the risk(s) and benefit(s) of doing nothing. The patient was provided information about the general risks and possible complications associated with the procedure. These may include, but are not limited to: failure to achieve desired goals, infection, bleeding, organ or nerve damage, allergic reactions, paralysis, and death. In addition, the patient was informed of those risks and complications associated to the procedure, such as failure to decrease pain; infection; bleeding; organ or nerve damage with subsequent damage to sensory, motor, and/or autonomic systems, resulting in permanent pain, numbness, and/or weakness of one or several areas of the body; allergic reactions; (i.e.: anaphylactic reaction); and/or death. Furthermore, the patient was informed of those risks and complications associated with the medications. These include, but are not limited to: allergic reactions (i.e.: anaphylactic or anaphylactoid reaction(s)); adrenal axis suppression; blood sugar elevation that in diabetics may result in ketoacidosis or comma; water retention that in patients with history of congestive heart failure may result in shortness of breath, pulmonary edema, and decompensation with resultant heart failure; weight gain; swelling or edema; medication-induced neural toxicity; particulate matter embolism and blood vessel occlusion with resultant organ, and/or nervous system infarction; and/or aseptic necrosis of one or more joints. Finally, the patient was informed that Medicine is not an exact science; therefore, there is also the possibility of unforeseen or unpredictable risks and/or possible complications that may result in a catastrophic outcome. The patient indicated having understood very clearly. We have given the patient no guarantees and we have made no promises. Enough time was given to the patient to ask questions, all of which were answered to the patient's  satisfaction. Ms. Kosiba has indicated that she wanted to continue with the procedure. Attestation: I, the ordering provider, attest that I have discussed with the patient the benefits, risks, side-effects, alternatives, likelihood of achieving goals, and potential problems during recovery for the procedure that I have provided informed consent. Date  Time: 02/13/2019  2:19 PM  Pre-Procedure Preparation:  Monitoring: As per clinic protocol. Respiration, ETCO2, SpO2, BP, heart rate and rhythm monitor placed and checked for adequate function Safety Precautions: Patient was assessed for positional comfort and pressure points before starting the procedure. Time-out: I initiated and conducted the "Time-out" before starting the procedure, as per protocol. The patient was asked to participate by confirming the accuracy of the "Time Out" information. Verification of the correct person, site, and procedure were performed and confirmed by me, the nursing staff, and the patient. "Time-out" conducted as per Joint Commission's Universal Protocol (UP.01.01.01). Time: 1506  Description of Procedure #1:  Safety Precautions: Aspiration looking for blood return was conducted prior to all injections. At no point did we inject any substances, as a needle was being advanced. No attempts were made at seeking any paresthesias. Safe injection practices and needle disposal techniques used. Medications properly checked for expiration dates. SDV (single dose vial) medications used. Description of the Procedure: Protocol guidelines were followed. The patient was placed in position over the fluoroscopy table. The target area was identified and the area prepped in the usual manner. Skin & deeper tissues infiltrated with local anesthetic. Appropriate amount of time allowed to pass for local anesthetics to take effect. The procedure needles were then advanced to the target area. Proper needle placement secured. Negative aspiration  confirmed. Solution injected in intermittent fashion,  asking for systemic symptoms every 0.5cc of injectate. The needles were then removed and the area cleansed, making sure to leave some of the prepping solution back to take advantage of its long term bactericidal properties.  Start Time: 1506 hrs. Materials:  Needle(s) Type: Spinal Needle Gauge: 22G Length: 5.0-in Medication(s): Please see orders for medications and dosing details.  Imaging Guidance (Non-Spinal) for procedure #1:  Type of Imaging Technique: Fluoroscopy Guidance (Non-Spinal) Indication(s): Assistance in needle guidance and placement for procedures requiring needle placement in or near specific anatomical locations not easily accessible without such assistance. Exposure Time: Please see nurses notes. Contrast: Before injecting any contrast, we confirmed that the patient did not have an allergy to iodine, shellfish, or radiological contrast. Once satisfactory needle placement was completed at the desired level, radiological contrast was injected. Contrast injected under live fluoroscopy. No contrast complications. See chart for type and volume of contrast used. Fluoroscopic Guidance: I was personally present during the use of fluoroscopy. "Tunnel Vision Technique" used to obtain the best possible view of the target area. Parallax error corrected before commencing the procedure. "Direction-depth-direction" technique used to introduce the needle under continuous pulsed fluoroscopy. Once target was reached, antero-posterior, oblique, and lateral fluoroscopic projection used confirm needle placement in all planes. Images permanently stored in EMR. Interpretation: I personally interpreted the imaging intraoperatively. Adequate needle placement confirmed in multiple planes. Appropriate spread of contrast into desired area was observed. No evidence of afferent or efferent intravascular uptake. Permanent images saved into the patient's  record.  Note: I reevaluated the patient 15 minutes after having completed the intra-articular hip joint injection and she indicated still having some pain around the tailbone area and going down through the back of the leg into the calf.  This pain did not reach the foot and also did not crossover to the lateral medial portion of the foot.  Because she has continued to have this despite the hip block, I will go ahead and proceed with the caudal epidural steroid injection.  Description of Procedure #2:  Target Area: Caudal Epidural Canal. Approach: Midline approach. Area Prepped: Entire Posterior Sacrococcygeal Region Prepping solution: ChloraPrep (2% chlorhexidine gluconate and 70% isopropyl alcohol) Safety Precautions: Aspiration looking for blood return was conducted prior to all injections. At no point did we inject any substances, as a needle was being advanced. No attempts were made at seeking any paresthesias. Safe injection practices and needle disposal techniques used. Medications properly checked for expiration dates. SDV (single dose vial) medications used. Description of the Procedure: Protocol guidelines were followed. The patient was placed in position over the fluoroscopy table. The target area was identified and the area prepped in the usual manner. Skin & deeper tissues infiltrated with local anesthetic. Appropriate amount of time allowed to pass for local anesthetics to take effect. The procedure needles were then advanced to the target area. Proper needle placement secured. Negative aspiration confirmed.  At this point, contrast was injected into the canal.  Initial injection demonstrated some intravascular uptake and therefore the needle had to be repositioned and the contrast injected again.  This maneuver had to be repeated 3 times until we were able to obtain a clean epidurogram without vascular uptake.  Solution injected in intermittent fashion, asking for systemic symptoms every  0.5cc of injectate. The needles were then removed and the area cleansed, making sure to leave some of the prepping solution back to take advantage of its long term bactericidal properties.  Vitals:   02/13/19 1541 02/13/19  1545 02/13/19 1550 02/13/19 1559  BP: (!) 158/77 (!) 145/77 (!) 149/74 (!) 149/81  Pulse:      Resp: 15 15 15 13   Temp:      TempSrc:      SpO2: 99% 97% 95% 99%  Weight:      Height:       End Time: 1513(caudal stop time 1550) hrs. Materials:  Needle(s) Type: Epidural needle Gauge: 17G Length: 3.5-in Medication(s): Please see orders for medications and dosing details.  Imaging Guidance (Spinal) for procedure #2:  Type of Imaging Technique: Fluoroscopy Guidance (Spinal) Indication(s): Assistance in needle guidance and placement for procedures requiring needle placement in or near specific anatomical locations not easily accessible without such assistance. Exposure Time: Please see nurses notes. Contrast: Before injecting any contrast, we confirmed that the patient did not have an allergy to iodine, shellfish, or radiological contrast. Once satisfactory needle placement was completed at the desired level, radiological contrast was injected. Contrast injected under live fluoroscopy. No contrast complications. See chart for type and volume of contrast used. Fluoroscopic Guidance: I was personally present during the use of fluoroscopy. "Tunnel Vision Technique" used to obtain the best possible view of the target area. Parallax error corrected before commencing the procedure. "Direction-depth-direction" technique used to introduce the needle under continuous pulsed fluoroscopy. Once target was reached, antero-posterior, oblique, and lateral fluoroscopic projection used confirm needle placement in all planes. Images permanently stored in EMR. Interpretation: I personally interpreted the imaging intraoperatively. Adequate needle placement confirmed in multiple planes. Appropriate  spread of contrast into desired area was observed.  After the initial vascular uptake and repositioning of the epidural needle, no evidence of afferent or efferent intravascular uptake was again observed. No intrathecal or subarachnoid spread observed. Permanent images saved into the patient's record.  Antibiotic Prophylaxis:   Anti-infectives (From admission, onward)   None     Indication(s): None identified  Post-operative Assessment:  Post-procedure Vital Signs:  Pulse/HCG Rate: 7264 Temp: 98.4 F (36.9 C) Resp: 13 BP: (!) 149/81 SpO2: 99 %  EBL: None  Complications: No immediate post-treatment complications observed by team, or reported by patient.  Note: The patient tolerated the entire procedure well. A repeat set of vitals were taken after the procedure and the patient was kept under observation following institutional policy, for this type of procedure. Post-procedural neurological assessment was performed, showing return to baseline, prior to discharge. The patient was provided with post-procedure discharge instructions, including a section on how to identify potential problems. Should any problems arise concerning this procedure, the patient was given instructions to immediately contact us, at any time, without hesitation. In any case, we plan to contact the patient by telephone for a follow-up status report regarding this interventional procedure.  Comments:  No additional relevant information.  Plan of Care   Imaging Orders     DG C-Arm 1-60 Min-No Report     DG HIP UNILAT W OR W/O PELVIS 2-3 VIEWS RIGHT  Procedure Orders     HIP INJECTION     Caudal Epidural Injection  Medications ordered for procedure: Meds ordered this encounter  Medications  . iopamidol (ISOVUE-M) 41 % intrathecal injection 10 mL    Must be Myelogram-compatible. If not available, you may substitute with a water-soluble, non-ionic, hypoallergenic, myelogram-compatible radiological contrast  medium.  Marland Kitchen lidocaine (XYLOCAINE) 2 % (with pres) injection 400 mg  . midazolam (VERSED) 5 MG/5ML injection 1-2 mg    Make sure Flumazenil is available in the pyxis when using  this medication. If oversedation occurs, administer 0.2 mg IV over 15 sec. If after 45 sec no response, administer 0.2 mg again over 1 min; may repeat at 1 min intervals; not to exceed 4 doses (1 mg)  . fentaNYL (SUBLIMAZE) injection 25-50 mcg    Make sure Narcan is available in the pyxis when using this medication. In the event of respiratory depression (RR< 8/min): Titrate NARCAN (naloxone) in increments of 0.1 to 0.2 mg IV at 2-3 minute intervals, until desired degree of reversal.  . lactated ringers infusion 1,000 mL  . methylPREDNISolone acetate (DEPO-MEDROL) injection 80 mg  . ropivacaine (PF) 2 mg/mL (0.2%) (NAROPIN) injection 9 mL  . sodium chloride flush (NS) 0.9 % injection 2 mL  . ropivacaine (PF) 2 mg/mL (0.2%) (NAROPIN) injection 2 mL  . triamcinolone acetonide (KENALOG-40) injection 40 mg   Medications administered: We administered iopamidol, lidocaine, midazolam, fentaNYL, lactated ringers, methylPREDNISolone acetate, ropivacaine (PF) 2 mg/mL (0.2%), sodium chloride flush, ropivacaine (PF) 2 mg/mL (0.2%), and triamcinolone acetonide.  See the medical record for exact dosing, route, and time of administration.  Disposition: Discharge home  Discharge Date & Time: 02/13/2019; 1620 hrs.   Physician-requested Follow-up: Return for PPE (2 wks), w/ Dr. Dossie Arbour.  Future Appointments  Date Time Provider [place]  03/03/2019  1:15 PM Milinda Pointer, MD [facility] None  04/07/2019 11:15 AM Vevelyn Francois, NP [facility] None  04/08/2019 11:15 AM Lloyd Huger, MD [facility] None  04/10/2019  3:00 PM Einar Pheasant, MD [facility]  07/10/2019  9:30 AM Einar Pheasant, MD [facility]  07/10/2019 10:00 AM O'Brien-Blaney, Bryson Corona, LPN [facility]   Primary Care Physician: Einar Pheasant,  MD Location: [facility] Note by: Gaspar Cola, MD Date: 02/13/2019; Time: 4:01 PM  Disclaimer:  Medicine is not an Chief Strategy Officer. The only guarantee in medicine is that nothing is guaranteed. It is important to note that the decision to proceed with this intervention was based on the information collected from the patient. The Data and conclusions were drawn from the patient's questionnaire, the interview, and the physical examination. Because the information was provided in large part by the patient, it cannot be guaranteed that it has not been purposely or unconsciously manipulated. Every effort has been made to obtain as much relevant data as possible for this evaluation. It is important to note that the conclusions that lead to this procedure are derived in large part from the available data. Always take into account that the treatment will also be dependent on availability of resources and existing treatment guidelines, considered by other Pain Management Practitioners as being common knowledge and practice, at the time of the intervention. For Medico-Legal purposes, it is also important to point out that variation in procedural techniques and pharmacological choices are the acceptable norm. The indications, contraindications, technique, and results of the above procedure should only be interpreted and judged by a Board-Certified Interventional Pain Specialist with extensive familiarity and expertise in the same exact procedure and technique.

## 2019-02-14 ENCOUNTER — Telehealth: Payer: Self-pay

## 2019-02-14 ENCOUNTER — Other Ambulatory Visit: Payer: Self-pay | Admitting: Internal Medicine

## 2019-02-14 DIAGNOSIS — M150 Primary generalized (osteo)arthritis: Secondary | ICD-10-CM

## 2019-02-14 DIAGNOSIS — M7918 Myalgia, other site: Secondary | ICD-10-CM

## 2019-02-14 DIAGNOSIS — M159 Polyosteoarthritis, unspecified: Secondary | ICD-10-CM

## 2019-02-14 NOTE — Telephone Encounter (Signed)
Post procedure phone call. Patient states she is doing good.  

## 2019-03-02 NOTE — Progress Notes (Deleted)
Patient's Name: Wanda Martinez  MRN: [id number]  Referring Provider: Einar Pheasant, MD  DOB: Apr 10, 1935  PCP: Einar Pheasant, MD  DOS: 03/03/2019  Note by: Gaspar Cola, MD  Service setting: Ambulatory outpatient  Specialty: Interventional Pain Management  Location: [facility] (AMB) Pain Management Facility    Patient type: Established   Primary Reason(s) for Visit: Encounter for post-procedure evaluation of chronic illness with mild to moderate exacerbation CC: No chief complaint on file.  HPI  Wanda Martinez is a 84 y.o. year old, female patient, who comes today for a post-procedure evaluation. She has Hypercholesterolemia; Essential hypertension; Frequent UTI; Primary cancer of upper inner quadrant of right female breast ([place]); Anemia; Tremor; Toenail fungus; Headache; Obesity; Dysuria; Sinusitis; Parkinsons ([place]); Fatigue; Chronic kidney disease (CKD), stage II (mild); Situational depression; Incomplete bladder emptying; Mixed incontinence; HLD (hyperlipidemia); Bladder infection, chronic; Parkinson's disease ([place]); Pure hypercholesterolemia; Hernia, rectovaginal; Long term current use of opiate analgesic; Long term prescription opiate use; Opiate use; Encounter for therapeutic drug level monitoring; Encounter for chronic pain management; Chronic low back pain (Primary Source of Pain) (Bilateral) (R>L); Lumbar spondylosis; Chronic hip pain (Bilateral); Chronic neck pain; Cervical spondylosis; Chronic cervical radicular pain (Right); Osteoarthritis; Diffuse myofascial pain syndrome; Neurogenic pain; Chronic upper back pain (Right); Myofascial pain syndrome (Right) (cervicothoracic); Vaginitis; Lumbar facet syndrome (Bilateral) (R>L); Malignant neoplasm of right female breast ([place]); Chronic constipation; Cervical facet hypertrophy; Cervical facet syndrome ([place]); Chronic shoulder pain (Right); Chronic pain syndrome; Chronic sacroiliac joint pain (Left); Chronic sacroiliac joint pain (Right);  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, L4-5, L5-S1) (Right); Lumbar spinal stenosis (with neurogenic claudication) (L3-4); Chronic lower extremity pain (Secondary Area of Pain) (Right); Chronic lumbar radicular pain (S1) (Right); Osteopenia; Trochanteric bursitis of hip (Bilateral); Spondylosis without myelopathy or radiculopathy, lumbosacral region; Trigger point with back pain (Right); DDD (degenerative disc disease), lumbosacral; Hyponatremia; Pneumonia; Fall; GERD (gastroesophageal reflux disease); Constipation due to opioid therapy; Pharmacologic therapy; Disorder of skeletal system; Problems influencing health status; Chronic upper extremity pain (Right); Chronic thoracic back pain (Bilateral) (L>R); Trigger point of thoracic region (Bilateral) (L>R); Other specified dorsopathies, sacral and sacrococcygeal region; Sacroiliac joint dysfunction (Right); Osteoarthritis of sacroiliac joint (Right); Somatic dysfunction of sacroiliac joint (Right); Chronic musculoskeletal pain; Loss of memory; Facial pain; Chronic hip pain (Right); and Osteoarthritis of hip (Right) on their problem list. Her primarily concern today is the No chief complaint on file.  Pain Assessment: Location:     Radiating:   Onset:   Duration:   Quality:   Severity:  /10 (subjective, self-reported pain score)  Note: Reported level is compatible with observation.                         When using our objective Pain Scale, levels between 6 and 10/10 are said to belong in an emergency room, as it progressively worsens from a 6/10, described as severely limiting, requiring emergency care not usually available at an outpatient pain management facility. At a 6/10 level, communication becomes difficult and requires great effort. Assistance to reach the emergency department may be required. Facial flushing and profuse sweating along with potentially dangerous increases in heart rate and blood pressure will be evident. Effect on ADL:   Timing:    Modifying factors:   BP:    HR:    Wanda Martinez comes in today for post-procedure evaluation.  Further details on both, my assessment(s), as well as the proposed treatment plan, please see below.  Post-Procedure Assessment  02/13/2019  Procedure: Diagnostic right-sided intra-articular hip joint injection #1, followed on the same date by diagnostic right-sided caudal epidural steroid injection #1, under fluoroscopic guidance and IV sedation Pre-procedure pain score:  8/10 Post-procedure pain score: 0/10 (100% relief) immediately after the right intra-articular hip joint injection, we waited approximately 15 minutes to evaluate her pain and she indicated that there were still some pain left there.  Because of that, the decision was made to proceed with the right-sided caudal epidural steroid injection to see if the rest of this pain could be eliminated.  This would suggest that she had a component of both areas. Influential Factors: BMI:   Intra-procedural challenges: None observed.         Assessment challenges: None detected.              Reported side-effects: None.        Post-procedural adverse reactions or complications: None reported         Sedation: Sedation provided. When no sedatives are used, the analgesic levels obtained are directly associated to the effectiveness of the local anesthetics. However, when sedation is provided, the level of analgesia obtained during the initial 1 hour following the intervention, is believed to be the result of a combination of factors. These factors may include, but are not limited to: 1. The effectiveness of the local anesthetics used. 2. The effects of the analgesic(s) and/or anxiolytic(s) used. 3. The degree of discomfort experienced by the patient at the time of the procedure. 4. The patients ability and reliability in recalling and recording the events. 5. The presence and influence of possible secondary gains and/or psychosocial  factors. Reported result: Relief experienced during the 1st hour after the procedure:   (Ultra-Short Term Relief)            Interpretative annotation: Clinically appropriate result. Analgesia during this period is likely to be Local Anesthetic and/or IV Sedative (Analgesic/Anxiolytic) related.          Effects of local anesthetic: The analgesic effects attained during this period are directly associated to the localized infiltration of local anesthetics and therefore cary significant diagnostic value as to the etiological location, or anatomical origin, of the pain. Expected duration of relief is directly dependent on the pharmacodynamics of the local anesthetic used. Long-acting (4-6 hours) anesthetics used.  Reported result: Relief during the next 4 to 6 hour after the procedure:   (Short-Term Relief)            Interpretative annotation: Clinically appropriate result. Analgesia during this period is likely to be Local Anesthetic-related.          Long-term benefit: Defined as the period of time past the expected duration of local anesthetics (1 hour for short-acting and 4-6 hours for long-acting). With the possible exception of prolonged sympathetic blockade from the local anesthetics, benefits during this period are typically attributed to, or associated with, other factors such as analgesic sensory neuropraxia, antiinflammatory effects, or beneficial biochemical changes provided by agents other than the local anesthetics.  Reported result: Extended relief following procedure:   (Long-Term Relief)            Interpretative annotation: Clinically possible results. Good relief. No permanent benefit expected. Inflammation plays a part in the etiology to the pain.          Current benefits: Defined as reported results that persistent at this point in time.   Analgesia: *** %            Function: Somewhat  improved ROM: Somewhat improved Interpretative annotation: Recurrence of symptoms. No permanent  benefit expected. Effective diagnostic intervention.          Interpretation: Results would suggest a successful diagnostic intervention.                  Plan:  Please see "Plan of Care" for details.                Laboratory Chemistry  Inflammation Markers (CRP: Acute Phase) (ESR: Chronic Phase) Lab Results  Component Value Date   CRP 3 07/17/2018   ESRSEDRATE 27 07/17/2018                         Renal Markers Lab Results  Component Value Date   BUN 14 01/08/2019   CREATININE 1.16 (H) 01/08/2019   BCR 12 01/08/2019   GFRAA 51 (L) 01/08/2019   GFRNONAA 44 (L) 01/08/2019                             Hepatic Markers Lab Results  Component Value Date   AST 28 01/08/2019   ALT 25 07/09/2018   ALBUMIN 4.5 01/08/2019                        Note: Lab results reviewed.  Recent Imaging Results   Results for orders placed in visit on 02/13/19  DG C-Arm 1-60 Min-No Report   Narrative Fluoroscopy was utilized by the requesting physician.  No radiographic  interpretation.    Interpretation Report: Fluoroscopy was used during the procedure to assist with needle guidance. The images were interpreted intraoperatively by the requesting physician.        Meds   Current Outpatient Medications:  .  acyclovir ointment (ZOVIRAX) 5 %, Apply 1 application topically 4 (four) times daily., Disp: 15 g, Rfl: 0 .  amLODipine (NORVASC) 2.5 MG tablet, TAKE 1 TABLET BY MOUTH ONCE DAILY., Disp: 30 tablet, Rfl: 0 .  aspirin 81 MG EC tablet, Take 81 mg by mouth daily as needed.  , Disp: , Rfl:  .  Calcium Carbonate (CALCIUM 600) 1500 MG TABS, Take 1 tablet by mouth daily.  , Disp: , Rfl:  .  citalopram (CELEXA) 20 MG tablet, TAKE 1 & 1/2 TABLETS BY MOUTH ONCE DAILY, Disp: , Rfl:  .  cyclobenzaprine (FLEXERIL) 10 MG tablet, TAKE ONE TABLET BY MOUTH AT BEDTIME., Disp: 30 tablet, Rfl: 0 .  gabapentin (NEURONTIN) 100 MG capsule, Take 200 mg by mouth 3 (three) times daily. , Disp: , Rfl: 1 .   GLUCOSAMINE SULFATE PO, Take by mouth daily. , Disp: , Rfl:  .  hydrochlorothiazide (HYDRODIURIL) 25 MG tablet, Take 0.5 tablets by mouth daily., Disp: , Rfl: 10 .  HYDROcodone-acetaminophen (NORCO/VICODIN) 5-325 MG tablet, Take 1 tablet by mouth every 8 (eight) hours as needed for severe pain., Disp: 90 tablet, Rfl: 0 .  [START ON 03/12/2019] HYDROcodone-acetaminophen (NORCO/VICODIN) 5-325 MG tablet, Take 1 tablet by mouth every 8 (eight) hours as needed for up to 30 days for severe pain., Disp: 90 tablet, Rfl: 0 .  HYDROcodone-acetaminophen (NORCO/VICODIN) 5-325 MG tablet, Take 1 tablet by mouth every 8 (eight) hours as needed for up to 30 days for severe pain., Disp: 90 tablet, Rfl: 0 .  letrozole (FEMARA) 2.5 MG tablet, Take 2.5 mg by mouth daily., Disp: , Rfl:  .  losartan (COZAAR) 100  MG tablet, TAKE 1 TABLET BY MOUTH ONCE DAILY., Disp: 30 tablet, Rfl: 0 .  lubiprostone (AMITIZA) 24 MCG capsule, TAKE 1 CAPSULE BY MOUTH ONCE DAILY WITH BREAKFAST., Disp: , Rfl:  .  magnesium oxide (MAG-OX) 400 (241.3 Mg) MG tablet, TAKE 1 TABLET BY MOUTH ONCE DAILY., Disp: 30 tablet, Rfl: 0 .  meloxicam (MOBIC) 7.5 MG tablet, TAKE 1 TABLET BY MOUTH ONCE DAILY., Disp: 30 tablet, Rfl: 0 .  metoprolol succinate (TOPROL-XL) 25 MG 24 hr tablet, TAKE 1 TABLET BY MOUTH TWICE DAILY, Disp: 60 tablet, Rfl: 10 .  Multiple Vitamin (MULTIVITAMIN) tablet, Take 1 tablet by mouth daily., Disp: , Rfl:  .  Omega-3 1000 MG CAPS, Take by mouth daily. , Disp: , Rfl:  .  omeprazole (PRILOSEC) 20 MG capsule, TAKE 1 CAPSULE BY MOUTH TWICE DAILY FOR REFLUX., Disp: 60 capsule, Rfl: 1 .  pantoprazole (PROTONIX) 40 MG tablet, TAKE 1 TABLET BY MOUTH ONCE DAILY., Disp: 30 tablet, Rfl: 0 .  pravastatin (PRAVACHOL) 20 MG tablet, TAKE 1 TABLET BY MOUTH ONCE DAILY., Disp: 30 tablet, Rfl: 0  ROS  Constitutional: Denies any fever or chills Gastrointestinal: No reported hemesis, hematochezia, vomiting, or acute GI distress Musculoskeletal: Denies  any acute onset joint swelling, redness, loss of ROM, or weakness Neurological: No reported episodes of acute onset apraxia, aphasia, dysarthria, agnosia, amnesia, paralysis, loss of coordination, or loss of consciousness  Allergies  Ms. Tessier is allergic to sulfa antibiotics and vesicare [solifenacin].  [place]  Drug: Ms. Tillis  reports no history of drug use. Alcohol:  reports no history of alcohol use. Tobacco:  reports that she has never smoked. She has never used smokeless tobacco. Medical:  has a past medical history of Acute postoperative pain (10/25/2017), Anemia, Arm pain (07/26/2015), Arthritis, Arthritis, degenerative (03/26/2014), Back pain (11/01/2013), Bladder infection (06/2018), Breast cancer ([place]), Breast cancer ([place]), CHEST PAIN (04/29/2010), Chronic cystitis, Cystocele (02/22/2013), Cystocele, midline (08/19/2013), Degeneration of intervertebral disc of lumbosacral region (03/26/2014), DYSPNEA (04/29/2010), Enthesopathy of hip (03/26/2014), GERD (gastroesophageal reflux disease), Hiatal hernia, HTN (hypertension), Hypokalemia (06/2018), Hyponatremia (06/2018), LBP (low back pain) (03/26/2014), Neck pain (11/01/2013), Parkinson disease ([place]), Pneumonia (06/2018), Sinusitis (02/07/2015), Skin lesions (07/12/2014), and Urinary incontinence. Surgical: Ms. Troeger  has a past surgical history that includes Breast biopsy (2013); Tonsillectomy and adenoidectomy (81); Vesicovaginal fistula closure w/ TAH (1983); Breast surgery (1986); Breast enhancement surgery (1987); Breast implant removal; Breast implant removal (Right, 08/29/2012); Mastectomy (08/2012); Abdominal hysterectomy; and Colonoscopy with propofol (N/A, 09/13/2016). Family: family history includes Colon polyps in her father; Diabetes in her father; Parkinson's disease in her mother; Stroke in her father and mother.  Constitutional Exam  General appearance: Well nourished, well developed, and well hydrated. In no apparent acute distress There were no  vitals filed for this visit. BMI Assessment: Estimated body mass index is 30.9 kg/m as calculated from the following:   Height as of 02/13/19: _0  (1.626 m).   Weight as of 02/13/19: 180 lb (81.6 kg).  BMI interpretation table: BMI level Category Range association with higher incidence of chronic pain  <18 kg/m2 Underweight   18.5-24.9 kg/m2 Ideal body weight   25-29.9 kg/m2 Overweight Increased incidence by 20%  30-34.9 kg/m2 Obese (Class I) Increased incidence by 68%  35-39.9 kg/m2 Severe obesity (Class II) Increased incidence by 136%  >40 kg/m2 Extreme obesity (Class III) Increased incidence by 254%   Patient's current BMI Ideal Body weight  There is no height or weight on file to calculate BMI. Patient weight  not recorded   BMI Readings from Last 4 Encounters:  02/13/19 30.90 kg/m  01/08/19 30.90 kg/m  10/24/18 30.90 kg/m  10/17/18 30.90 kg/m   Wt Readings from Last 4 Encounters:  02/13/19 180 lb (81.6 kg)  01/08/19 180 lb (81.6 kg)  10/24/18 180 lb (81.6 kg)  10/17/18 180 lb (81.6 kg)  Psych/Mental status: Alert, oriented x 3 (person, place, & time)       Eyes: PERLA Respiratory: No evidence of acute respiratory distress  Cervical Spine Area Exam  Skin & Axial Inspection: No masses, redness, edema, swelling, or associated skin lesions Alignment: Symmetrical Functional ROM: Unrestricted ROM      Stability: No instability detected Muscle Tone/Strength: Functionally intact. No obvious neuro-muscular anomalies detected. Sensory (Neurological): Unimpaired Palpation: No palpable anomalies              Upper Extremity (UE) Exam    Side: Right upper extremity  Side: Left upper extremity  Skin & Extremity Inspection: Skin color, temperature, and hair growth are WNL. No peripheral edema or cyanosis. No masses, redness, swelling, asymmetry, or associated skin lesions. No contractures.  Skin & Extremity Inspection: Skin color, temperature, and hair growth are WNL. No  peripheral edema or cyanosis. No masses, redness, swelling, asymmetry, or associated skin lesions. No contractures.  Functional ROM: Unrestricted ROM          Functional ROM: Unrestricted ROM          Muscle Tone/Strength: Functionally intact. No obvious neuro-muscular anomalies detected.  Muscle Tone/Strength: Functionally intact. No obvious neuro-muscular anomalies detected.  Sensory (Neurological): Unimpaired          Sensory (Neurological): Unimpaired          Palpation: No palpable anomalies              Palpation: No palpable anomalies              Provocative Test(s):  Phalen's test: deferred Tinel's test: deferred Apley's scratch test (touch opposite shoulder):  Action 1 (Across chest): deferred Action 2 (Overhead): deferred Action 3 (LB reach): deferred   Provocative Test(s):  Phalen's test: deferred Tinel's test: deferred Apley's scratch test (touch opposite shoulder):  Action 1 (Across chest): deferred Action 2 (Overhead): deferred Action 3 (LB reach): deferred    Thoracic Spine Area Exam  Skin & Axial Inspection: No masses, redness, or swelling Alignment: Symmetrical Functional ROM: Unrestricted ROM Stability: No instability detected Muscle Tone/Strength: Functionally intact. No obvious neuro-muscular anomalies detected. Sensory (Neurological): Unimpaired Muscle strength & Tone: No palpable anomalies  Lumbar Spine Area Exam  Skin & Axial Inspection: No masses, redness, or swelling Alignment: Symmetrical Functional ROM: Unrestricted ROM       Stability: No instability detected Muscle Tone/Strength: Functionally intact. No obvious neuro-muscular anomalies detected. Sensory (Neurological): Unimpaired Palpation: No palpable anomalies       Provocative Tests: Hyperextension/rotation test: deferred today       Lumbar quadrant test (Kemp's test): deferred today       Lateral bending test: deferred today       Patrick's Maneuver: deferred today                   FABER*  test: deferred today                   S-I anterior distraction/compression test: deferred today         S-I lateral compression test: deferred today         S-I Thigh-thrust test:  deferred today         S-I Gaenslen's test: deferred today         *(Flexion, ABduction and External Rotation)  Gait & Posture Assessment  Ambulation: Unassisted Gait: Relatively normal for age and body habitus Posture: WNL   Lower Extremity Exam    Side: Right lower extremity  Side: Left lower extremity  Stability: No instability observed          Stability: No instability observed          Skin & Extremity Inspection: Skin color, temperature, and hair growth are WNL. No peripheral edema or cyanosis. No masses, redness, swelling, asymmetry, or associated skin lesions. No contractures.  Skin & Extremity Inspection: Skin color, temperature, and hair growth are WNL. No peripheral edema or cyanosis. No masses, redness, swelling, asymmetry, or associated skin lesions. No contractures.  Functional ROM: Unrestricted ROM                  Functional ROM: Unrestricted ROM                  Muscle Tone/Strength: Functionally intact. No obvious neuro-muscular anomalies detected.  Muscle Tone/Strength: Functionally intact. No obvious neuro-muscular anomalies detected.  Sensory (Neurological): Unimpaired        Sensory (Neurological): Unimpaired        DTR: Patellar: deferred today Achilles: deferred today Plantar: deferred today  DTR: Patellar: deferred today Achilles: deferred today Plantar: deferred today  Palpation: No palpable anomalies  Palpation: No palpable anomalies   Assessment   Status Diagnosis  Controlled Controlled Controlled 1. Chronic low back pain (Primary Source of Pain) (Bilateral) (R>L)   2. Chronic hip pain (Right)   3. Chronic lower extremity pain (Secondary Area of Pain) (Right)   4. Chronic lumbar radicular pain (S1) (Right)   5. Lumbar spinal stenosis (with neurogenic claudication) (L3-4)    6. 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, L4-5, L5-S1) (Right)      Updated Problems: No problems updated.  Plan of Care  Pharmacotherapy (Medications Ordered): No orders of the defined types were placed in this encounter.  Medications administered today: Kamariyah T. [employer]" had no medications administered during this visit.  Orders:  No orders of the defined types were placed in this encounter.  Lab Orders  No laboratory test(s) ordered today   Imaging Orders  No imaging studies ordered today   Referral Orders  No referral(s) requested today   Planned follow-up:   No follow-ups on file. ***   Interventional management options: Considering:   Diagnosticright-sided lumbar facet block  Palliative right-sided lumbar facet RFA  PalliativeRight suprascapular muscle trigger point injection  Diagnosticbilateral cervical facet block  Possiblebilateral cervical facet RFA  Diagnostic right-sided cervical ESI  Diagnosticintra-articular hip joint injection  Possible hip joint RFA    Palliative PRN treatment(s):   Diagnostic right-sided lumbar facet block  Palliative right-sided lumbar facet RFA #3(last done on 06/04/18) Palliative Right suprascapular muscle trigger point injection  Diagnosticbilateral cervical facet block  Diagnosticright-sided cervical ESI Diagnosticintra-articular hip joint injection Palliative right-sided L3-4 interlaminar LESI#3  Palliative right-sided L5 transforaminal LESI #3    Future Appointments  Date Time Provider [place]  03/03/2019  1:15 PM Milinda Pointer, MD [facility] None  04/07/2019 11:15 AM Vevelyn Francois, NP [facility] None  04/08/2019 11:15 AM Lloyd Huger, MD [facility] None  04/10/2019  3:00 PM Einar Pheasant, MD [facility]  07/10/2019  9:30 AM Einar Pheasant, MD [facility]  07/10/2019 10:00 AM O'Brien-Blaney,  Bryson Corona, LPN [facility]   Primary Care Physician: Einar Pheasant, MD Location:  [facility] Note by: Gaspar Cola, MD Date: 03/03/2019; Time: 7:53 AM

## 2019-03-03 ENCOUNTER — Ambulatory Visit: Payer: Medicare Other | Admitting: Pain Medicine

## 2019-04-07 ENCOUNTER — Ambulatory Visit: Payer: Medicare Other | Attending: Nurse Practitioner | Admitting: Nurse Practitioner

## 2019-04-07 ENCOUNTER — Other Ambulatory Visit: Payer: Self-pay

## 2019-04-07 DIAGNOSIS — M7918 Myalgia, other site: Secondary | ICD-10-CM | POA: Diagnosis not present

## 2019-04-07 DIAGNOSIS — M533 Sacrococcygeal disorders, not elsewhere classified: Secondary | ICD-10-CM | POA: Diagnosis not present

## 2019-04-07 DIAGNOSIS — M25551 Pain in right hip: Secondary | ICD-10-CM

## 2019-04-07 DIAGNOSIS — G8929 Other chronic pain: Secondary | ICD-10-CM | POA: Diagnosis not present

## 2019-04-07 DIAGNOSIS — M47816 Spondylosis without myelopathy or radiculopathy, lumbar region: Secondary | ICD-10-CM | POA: Diagnosis not present

## 2019-04-07 DIAGNOSIS — M150 Primary generalized (osteo)arthritis: Secondary | ICD-10-CM | POA: Diagnosis not present

## 2019-04-07 DIAGNOSIS — M1611 Unilateral primary osteoarthritis, right hip: Secondary | ICD-10-CM

## 2019-04-07 DIAGNOSIS — M159 Polyosteoarthritis, unspecified: Secondary | ICD-10-CM

## 2019-04-07 DIAGNOSIS — G894 Chronic pain syndrome: Secondary | ICD-10-CM

## 2019-04-07 DIAGNOSIS — M47818 Spondylosis without myelopathy or radiculopathy, sacral and sacrococcygeal region: Secondary | ICD-10-CM | POA: Diagnosis not present

## 2019-04-07 DIAGNOSIS — M461 Sacroiliitis, not elsewhere classified: Secondary | ICD-10-CM

## 2019-04-07 MED ORDER — HYDROCODONE-ACETAMINOPHEN 5-325 MG PO TABS: 1.0000 | ORAL_TABLET | Freq: Three times a day (TID) | ORAL | 0 refills | Status: DC | PRN

## 2019-04-07 MED ORDER — MELOXICAM 7.5 MG PO TABS: 7.5000 mg | ORAL_TABLET | Freq: Every day | ORAL | 2 refills | Status: DC

## 2019-04-07 MED ORDER — CYCLOBENZAPRINE HCL 10 MG PO TABS: 10.0000 mg | ORAL_TABLET | Freq: Every day | ORAL | 2 refills | Status: DC

## 2019-04-07 MED ORDER — MAGNESIUM OXIDE 400 (241.3 MG) MG PO TABS: 1.0000 | ORAL_TABLET | Freq: Every day | ORAL | 2 refills | Status: DC

## 2019-04-07 NOTE — Patient Instructions (Signed)
____________________________________________________________________________________________  Medication Rules  Purpose: To inform patients, and their family members, of our rules and regulations.  Applies to: All patients receiving prescriptions (written or electronic).  Pharmacy of record: Pharmacy where electronic prescriptions will be sent. If written prescriptions are taken to a different pharmacy, please inform the nursing staff. The pharmacy listed in the electronic medical record should be the one where you would like electronic prescriptions to be sent.  Electronic prescriptions: In compliance with the [place] Strengthen Opioid Misuse Prevention (STOP) Act of 2017 (Session Law 2017-74/H243), effective December 18, 2018, all controlled substances must be electronically prescribed. Calling prescriptions to the pharmacy will cease to exist.  Prescription refills: Only during scheduled appointments. Applies to all prescriptions.  NOTE: The following applies primarily to controlled substances (Opioid* Pain Medications).   Patient's responsibilities: 1. Pain Pills: Bring all pain pills to every appointment (except for procedure appointments). 2. Pill Bottles: Bring pills in original pharmacy bottle. Always bring the newest bottle. Bring bottle, even if empty. 3. Medication refills: You are responsible for knowing and keeping track of what medications you take and those you need refilled. The day before your appointment: write a list of all prescriptions that need to be refilled. The day of the appointment: give the list to the admitting nurse. Prescriptions will be written only during appointments. No prescriptions will be written on procedure days. If you forget a medication: it will not be "Called in", "Faxed", or "electronically sent". You will need to get another appointment to get these prescribed. No early refills. Do not call asking to have your prescription filled  early. 4. Prescription Accuracy: You are responsible for carefully inspecting your prescriptions before leaving our office. Have the discharge nurse carefully go over each prescription with you, before taking them home. Make sure that your name is accurately spelled, that your address is correct. Check the name and dose of your medication to make sure it is accurate. Check the number of pills, and the written instructions to make sure they are clear and accurate. Make sure that you are given enough medication to last until your next medication refill appointment. 5. Taking Medication: Take medication as prescribed. When it comes to controlled substances, taking less pills or less frequently than prescribed is permitted and encouraged. Never take more pills than instructed. Never take medication more frequently than prescribed.  6. Inform other Doctors: Always inform, all of your healthcare providers, of all the medications you take. 7. Pain Medication from other Providers: You are not allowed to accept any additional pain medication from any other Doctor or Healthcare provider. There are two exceptions to this rule. (see below) In the event that you require additional pain medication, you are responsible for notifying us, as stated below. 8. Medication Agreement: You are responsible for carefully reading and following our Medication Agreement. This must be signed before receiving any prescriptions from our practice. Safely store a copy of your signed Agreement. Violations to the Agreement will result in no further prescriptions. (Additional copies of our Medication Agreement are available upon request.) 9. Laws, Rules, & Regulations: All patients are expected to follow all Federal and State Laws, Statutes, Rules, & Regulations. Ignorance of the Laws does not constitute a valid excuse. The use of any illegal substances is prohibited. 10. Adopted CDC guidelines & recommendations: Target dosing levels will be  at or below 60 MME/day. Use of benzodiazepines** is not recommended.  Exceptions: There are only two exceptions to the rule of not   receiving pain medications from other Healthcare Providers. 1. Exception #1 (Emergencies): In the event of an emergency (i.e.: accident requiring emergency care), you are allowed to receive additional pain medication. However, you are responsible for: As soon as you are able, call our office [phone], at any time of the day or night, and leave a message stating your name, the date and nature of the emergency, and the name and dose of the medication prescribed. In the event that your call is answered by a member of our staff, make sure to document and save the date, time, and the name of the person that took your information.  2. Exception #2 (Planned Surgery): In the event that you are scheduled by another doctor or dentist to have any type of surgery or procedure, you are allowed (for a period no longer than 30 days), to receive additional pain medication, for the acute post-op pain. However, in this case, you are responsible for picking up a copy of our "Post-op Pain Management for Surgeons" handout, and giving it to your surgeon or dentist. This document is available at our office, and does not require an appointment to obtain it. Simply go to our office during business hours (Monday-Thursday from 8:00 AM to 4:00 PM) (Friday 8:00 AM to 12:00 Noon) or if you have a scheduled appointment with us, prior to your surgery, and ask for it by name. In addition, you will need to provide us with your name, name of your surgeon, type of surgery, and date of procedure or surgery.  *Opioid medications include: morphine, codeine, oxycodone, oxymorphone, hydrocodone, hydromorphone, meperidine, tramadol, tapentadol, buprenorphine, fentanyl, methadone. **Benzodiazepine medications include: diazepam (Valium), alprazolam (Xanax), clonazepam (Klonopine), lorazepam (Ativan), clorazepate  (Tranxene), chlordiazepoxide (Librium), estazolam (Prosom), oxazepam (Serax), temazepam (Restoril), triazolam (Halcion) (Last updated: 02/14/2018) ____________________________________________________________________________________________    

## 2019-04-07 NOTE — Progress Notes (Signed)
Pain Management Encounter Note - Virtual Visit via Telephone Telehealth (real-time audio visits between healthcare provider and patient).  Patient's Phone No. & Preferred Pharmacy:  [phone] (home); [phone] (mobile); (Preferred) [place], [place] - [address] [place] [address] Phone: [phone] Fax: [phone]   Pre-screening note:  Our staff contacted [name] and offered her an "in person", "face-to-face" appointment versus a telephone encounter. She indicated preferring the telephone encounter, at this time.  Reason for Virtual Visit: COVID-19*  Social distancing based on CDC and AMA recommendations.   I contacted Wanda Martinez on 04/07/2019 at 11:57 AM by telephone and clearly identified myself as Dionisio David, NP. I verified that I was speaking with the correct person using two identifiers (Name and date of birth: 02/19/1935).  Advanced Informed Consent I sought verbal advanced consent from Wanda Martinez for telemedicine interactions and virtual visit. I informed [name] of the security and privacy concerns, risks, and limitations associated with performing an evaluation and management service by telephone. I also informed [name] of the availability of "in person" appointments and I informed her of the possibility of a patient responsible charge related to this service. Ms. Langenderfer expressed understanding and agreed to proceed.   Historic Elements   [name] is a 84 y.o. year old, female patient evaluated today after her last encounter by our practice on 03/03/2019. [name]  has a past medical history of Acute postoperative pain (10/25/2017), Anemia, Arm pain (07/26/2015), Arthritis, Arthritis, degenerative (03/26/2014), Back pain (11/01/2013), Bladder infection (06/2018), Breast cancer ([place]), Breast cancer ([place]), CHEST PAIN (04/29/2010), Chronic cystitis, Cystocele (02/22/2013),  Cystocele, midline (08/19/2013), Degeneration of intervertebral disc of lumbosacral region (03/26/2014), DYSPNEA (04/29/2010), Enthesopathy of hip (03/26/2014), GERD (gastroesophageal reflux disease), Hiatal hernia, HTN (hypertension), Hypokalemia (06/2018), Hyponatremia (06/2018), LBP (low back pain) (03/26/2014), Neck pain (11/01/2013), Parkinson disease ([place]), Pneumonia (06/2018), Sinusitis (02/07/2015), Skin lesions (07/12/2014), and Urinary incontinence. She also  has a past surgical history that includes Breast biopsy (2013); Tonsillectomy and adenoidectomy (79); Vesicovaginal fistula closure w/ TAH (1983); Breast surgery (1986); Breast enhancement surgery (1987); Breast implant removal; Breast implant removal (Right, 08/29/2012); Mastectomy (08/2012); Abdominal hysterectomy; and Colonoscopy with propofol (N/A, 09/13/2016). [name] has a current medication list which includes the following prescription(s): acyclovir ointment, amlodipine, aspirin, calcium carbonate, citalopram, cyclobenzaprine, gabapentin, glucosamine sulfate, hydrochlorothiazide, hydrocodone-acetaminophen, hydrocodone-acetaminophen, hydrocodone-acetaminophen, letrozole, losartan, lubiprostone, magnesium oxide, meloxicam, metoprolol succinate, multivitamin, omega-3, omeprazole, pantoprazole, and pravastatin. She  reports that she has never smoked. She has never used smokeless tobacco. She reports that she does not drink alcohol or use drugs. [name] is allergic to sulfa antibiotics and vesicare [solifenacin].   HPI  I last saw her on 01/08/2019. She is being evaluated for both, medication management and a post-procedure assessment. She has 8/10 left hip and leg pain. She admits hat this goes into her ankle. She admits that this pain has been getting worse. She got good relief out of the last RFA. She is wanting to get some pain relief. She has been having this flare up of pain for a while now. She admits that she uses mask and gloves when she does  got out.   Evaluation of last interventional procedure  02/13/2019 Procedure: Right hip injection Pre-procedure pain score:  6/10 Post-procedure pain score: 0/10         Influential Factors: Intra-procedural challenges: None observed.         Reported side-effects:  None.        Post-procedural adverse reactions or complications: None reported         Sedation: Please see nurses note for DOS. When no sedatives are used, the analgesic levels obtained are directly associated to the effectiveness of the local anesthetics. However, when sedation is provided, the level of analgesia obtained during the initial 1 hour following the intervention, is believed to be the result of a combination of factors. These factors may include, but are not limited to: 1. The effectiveness of the local anesthetics used. 2. The effects of the analgesic(s) and/or anxiolytic(s) used. 3. The degree of discomfort experienced by the patient at the time of the procedure. 4. The patients ability and reliability in recalling and recording the events. 5. The presence and influence of possible secondary gains and/or psychosocial factors. Reported result: Relief experienced during the 1st hour after the procedure:   (Ultra-Short Term Relief)            Interpretative annotation: Clinically appropriate result. Analgesia during this period is likely to be Local Anesthetic and/or IV Sedative (Analgesic/Anxiolytic) related.          Effects of local anesthetic: The analgesic effects attained during this period are directly associated to the localized infiltration of local anesthetics and therefore cary significant diagnostic value as to the etiological location, or anatomical origin, of the pain. Expected duration of relief is directly dependent on the pharmacodynamics of the local anesthetic used. Long-acting (4-6 hours) anesthetics used.  Reported result: Relief during the next 4 to 6 hour after the procedure:   (Short-Term Relief)             Interpretative annotation: Clinically appropriate result. Analgesia during this period is likely to be Local Anesthetic-related.          Long-term benefit: Defined as the period of time past the expected duration of local anesthetics (1 hour for short-acting and 4-6 hours for long-acting). With the possible exception of prolonged sympathetic blockade from the local anesthetics, benefits during this period are typically attributed to, or associated with, other factors such as analgesic sensory neuropraxia, antiinflammatory effects, or beneficial biochemical changes provided by agents other than the local anesthetics.  Reported result: Extended relief following procedure:   (Long-Term Relief)            Interpretative annotation:  No benefit. Incomplete therapeutic success. Persistent algesic mechanism detected.        She did not have any pain relief.  Pharmacotherapy Assessment  Analgesic:Hydrocodone/APAP 5/325 one tablet every 8 hours MME/day:15mg /day.   Monitoring: Pharmacotherapy: No side-effects or adverse reactions reported. [place] PMP: PDMP reviewed during this encounter.       Compliance: No problems identified. Plan: Refer to "POC".  Review of recent tests  DG C-Arm 1-60 Min-No Report Fluoroscopy was utilized by the requesting physician.  No radiographic  interpretation.    Clinical Support on 01/08/2019  Component Date Value Ref Range Status  . Summary 01/08/2019 FINAL   Final   Comment: ==================================================================== TOXASSURE SELECT 13 (MW) ==================================================================== Test                             Result       Flag       Units Drug Present and Declared for Prescription Verification   Hydrocodone                    1944  EXPECTED   ng/mg creat   Dihydrocodeine                 194          EXPECTED   ng/mg creat   Norhydrocodone                 2532         EXPECTED   ng/mg creat     Sources of hydrocodone include scheduled prescription    medications. Dihydrocodeine and norhydrocodone are expected    metabolites of hydrocodone. Dihydrocodeine is also available as a    scheduled prescription medication. ==================================================================== Test                      Result    Flag   Units      Ref Range   Creatinine              188              mg/dL      >=20 ==================================================================== Declare                          d Medications:  The flagging and interpretation on this report are based on the  following declared medications.  Unexpected results may arise from  inaccuracies in the declared medications.  **Note: The testing scope of this panel includes these medications:  Hydrocodone (Hydrocodone-Acetaminophen)  **Note: The testing scope of this panel does not include following  reported medications:  Acetaminophen (Hydrocodone-Acetaminophen)  Acyclovir  Amlodipine Besylate  Aspirin  Calcium carbonate  Citalopram  Cyclobenzaprine  Gabapentin  Glucosamine  Hydrochlorothiazide  Letrozole  Losartan (Losartan Potassium)  Lubiprostone  Magnesium Oxide  Meloxicam  Metoprolol  Multivitamin  Omega-3 Fatty Acids  Omeprazole  Pantoprazole  Pravastatin ==================================================================== For clinical consultation, please call [phone]. ====================================================================   . Glucose 01/08/2019 90  65 - 99 mg/dL Final  . BUN 01/08/2019 14  8 - 27 mg/dL Final  . Creatinine, Ser 01/08/2019 1.16* 0.57 - 1.00 mg/dL Final  . GFR calc non Af Amer 01/08/2019 44* >59 mL/min/1.73 Final  . GFR calc Af Amer 01/08/2019 51* >59 mL/min/1.73 Final  . BUN/Creatinine Ratio 01/08/2019 12  12 - 28 Final  . Sodium 01/08/2019 137  134 - 144 mmol/L Final  . Potassium 01/08/2019 4.5  3.5 - 5.2 mmol/L Final  . Chloride 01/08/2019 99   96 - 106 mmol/L Final  . Calcium 01/08/2019 9.8  8.7 - 10.3 mg/dL Final  . Total Protein 01/08/2019 7.1  6.0 - 8.5 g/dL Final  . Albumin 01/08/2019 4.5  3.6 - 4.6 g/dL Final                 **Please note reference interval change**  . Globulin, Total 01/08/2019 2.6  1.5 - 4.5 g/dL Final  . Albumin/Globulin Ratio 01/08/2019 1.7  1.2 - 2.2 Final  . Bilirubin Total 01/08/2019 0.4  0.0 - 1.2 mg/dL Final  . Alkaline Phosphatase 01/08/2019 68  39 - 117 IU/L Final  . AST 01/08/2019 28  0 - 40 IU/L Final   Assessment  The primary encounter diagnosis was Lumbar spondylosis. Diagnoses of Chronic pain syndrome, Diffuse myofascial pain syndrome, Primary osteoarthritis involving multiple joints, Chronic hip pain (Right), Osteoarthritis of hip (Right), Chronic sacroiliac joint pain (Right), and Osteoarthritis of sacroiliac joint (Right) were also pertinent to this visit.  Plan of Care  I  have changed Margaruite T. Nieland's HYDROcodone-acetaminophen, magnesium oxide, cyclobenzaprine, and meloxicam. I am also having her maintain her aspirin, calcium carbonate, multivitamin, lubiprostone, GLUCOSAMINE SULFATE PO, Omega-3, citalopram, omeprazole, gabapentin, metoprolol succinate, acyclovir ointment, hydrochlorothiazide, letrozole, amLODipine, pantoprazole, losartan, pravastatin, HYDROcodone-acetaminophen, and HYDROcodone-acetaminophen.  Pharmacotherapy (Medications Ordered): Meds ordered this encounter  Medications  . HYDROcodone-acetaminophen (NORCO/VICODIN) 5-325 MG tablet    Sig: Take 1 tablet by mouth every 8 (eight) hours as needed for up to 30 days for severe pain.    Dispense:  90 tablet    Refill:  0    Do not add this medication to the electronic "Automatic Refill" notification system. Patient may have prescription filled one day early if pharmacy is closed on scheduled refill date.    Order Specific Question:   Supervising Provider    Answer:   Milinda Pointer [phone]  . HYDROcodone-acetaminophen  (NORCO/VICODIN) 5-325 MG tablet    Sig: Take 1 tablet by mouth every 8 (eight) hours as needed for up to 30 days for severe pain.    Dispense:  90 tablet    Refill:  0    Do not add this medication to the electronic "Automatic Refill" notification system. Patient may have prescription filled one day early if pharmacy is closed on scheduled refill date.    Order Specific Question:   Supervising Provider    Answer:   Milinda Pointer [phone]  . HYDROcodone-acetaminophen (NORCO/VICODIN) 5-325 MG tablet    Sig: Take 1 tablet by mouth every 8 (eight) hours as needed for up to 30 days for severe pain.    Dispense:  90 tablet    Refill:  0    Do not add this medication to the electronic "Automatic Refill" notification system. Patient may have prescription filled one day early if pharmacy is closed on scheduled refill date.    Order Specific Question:   Supervising Provider    Answer:   Milinda Pointer [phone]  . magnesium oxide (MAG-OX) 400 (241.3 Mg) MG tablet    Sig: Take 1 tablet (400 mg total) by mouth daily.    Dispense:  30 tablet    Refill:  2    Order Specific Question:   Supervising Provider    Answer:   Milinda Pointer [phone]  . cyclobenzaprine (FLEXERIL) 10 MG tablet    Sig: Take 1 tablet (10 mg total) by mouth at bedtime.    Dispense:  30 tablet    Refill:  2    Order Specific Question:   Supervising Provider    Answer:   Milinda Pointer [phone]  . meloxicam (MOBIC) 7.5 MG tablet    Sig: Take 1 tablet (7.5 mg total) by mouth daily.    Dispense:  30 tablet    Refill:  2    Order Specific Question:   Supervising Provider    Answer:   Milinda Pointer [phone]   Orders:  Orders Placed This Encounter  Procedures  . Radiofrequency Sacroiliac Joint    Scheduling Instructions:     Side(s): Right-sided     Level(s): L4, L5, S1, S2, & S3 Medial Branch Nerve(s)     Sedation: With Sedation     Timeframe: Today     Procedure: Sacroiliac joint RFA   Follow-up  plan:   Return in about 3 months (around 07/07/2019) for MedMgmt, Procedure(w/Sedation), (R) L Fct RFA.   I discussed the assessment and treatment plan with the patient. The patient was provided an opportunity to ask questions and all were answered.  The patient agreed with the plan and demonstrated an understanding of the instructions.  Patient advised to call back or seek an in-person evaluation if the symptoms or condition worsens.  Total duration of non-face-to-face encounter: 18 minutes.  Note by: Dionisio David, NP Date: 04/07/2019; Time: 12:41 PM  Disclaimer:  * Given the special circumstances of the COVID-19 pandemic, the federal government has announced that the Office for Civil Rights (OCR) will exercise its enforcement discretion and will not impose penalties on physicians using telehealth in the event of noncompliance with regulatory requirements under the [place] (HIPAA) in connection with the good faith provision of telehealth during the YOFVW-86 national public health emergency. ([place])

## 2019-04-08 ENCOUNTER — Ambulatory Visit: Payer: Medicare Other | Admitting: Oncology

## 2019-04-08 ENCOUNTER — Encounter: Payer: Self-pay | Admitting: Internal Medicine

## 2019-04-08 ENCOUNTER — Other Ambulatory Visit: Payer: Self-pay

## 2019-04-08 ENCOUNTER — Ambulatory Visit (INDEPENDENT_AMBULATORY_CARE_PROVIDER_SITE_OTHER): Payer: Medicare Other | Admitting: Internal Medicine

## 2019-04-08 DIAGNOSIS — E7800 Pure hypercholesterolemia, unspecified: Secondary | ICD-10-CM | POA: Diagnosis not present

## 2019-04-08 DIAGNOSIS — K219 Gastro-esophageal reflux disease without esophagitis: Secondary | ICD-10-CM | POA: Diagnosis not present

## 2019-04-08 DIAGNOSIS — H9202 Otalgia, left ear: Secondary | ICD-10-CM | POA: Diagnosis not present

## 2019-04-08 DIAGNOSIS — C50911 Malignant neoplasm of unspecified site of right female breast: Secondary | ICD-10-CM

## 2019-04-08 DIAGNOSIS — N183 Chronic kidney disease, stage 3 unspecified: Secondary | ICD-10-CM

## 2019-04-08 DIAGNOSIS — G20 Parkinson's disease: Secondary | ICD-10-CM

## 2019-04-08 DIAGNOSIS — I10 Essential (primary) hypertension: Secondary | ICD-10-CM | POA: Diagnosis not present

## 2019-04-08 DIAGNOSIS — D649 Anemia, unspecified: Secondary | ICD-10-CM

## 2019-04-08 MED ORDER — AMOXICILLIN 875 MG PO TABS: 875.0000 mg | ORAL_TABLET | Freq: Two times a day (BID) | ORAL | 0 refills | Status: DC

## 2019-04-08 NOTE — Progress Notes (Addendum)
Patient ID: Wanda Martinez, female   DOB: May 05, 1935, 84 y.o.   MRN: [id number] Virtual Visit via Telephone Note  This visit type was conducted due to national recommendations for restrictions regarding the COVID-19 pandemic (e.g. social distancing).  This format is felt to be most appropriate for this patient at this time.  All issues noted in this document were discussed and addressed.  No physical exam was performed (except for noted visual exam findings with Video Visits).   I connected with@ on 04/08/19 at 11:00 AM EDT by telephone and verified that I am speaking with the correct person using two identifiers. Location patient: home Location provider: work Persons participating in the virtual visit: patient, provider  I discussed the limitations, risks, security and privacy concerns of performing an evaluation and management service by telephone and the availability of in person appointments. The patient expressed understanding and agreed to proceed.   Reason for visit: scheduled follow up.    HPI: She reports she is doing relatively well.  Does report that three weeks ago, developed left ear pain.  Some nasal congestion.  Using some sweet oil.  No fever.  Feels like ear infection.  No headache.  No chest congestion.  No sob.  No known COVID exposure.  Trying to stay active.  No chest pain.  No acid reflux.  No abdominal pain.  Bowels moving.  Being followed by pain clinic.  Taking norco for her back.  Stable. Also saw neurology.  On gabapentin.  Discussed the need to avoid antiinflammatories.     ROS: See pertinent positives and negatives per HPI.  Past Medical History:  Diagnosis Date  . Acute postoperative pain 10/25/2017  . Anemia   . Arm pain 07/26/2015  . Arthritis   . Arthritis, degenerative 03/26/2014  . Back pain 11/01/2013  . Bladder infection 06/2018  . Breast cancer ([place])    Masectomy - left - 1986   . Breast cancer [facility])    Mastectomy-right -2014  . CHEST PAIN  04/29/2010   Qualifier: Diagnosis of  By: Wynetta Emery RN, Doroteo Bradford    . Chronic cystitis   . Cystocele 02/22/2013  . Cystocele, midline 08/19/2013  . Degeneration of intervertebral disc of lumbosacral region 03/26/2014  . DYSPNEA 04/29/2010   Qualifier: Diagnosis of  By: Wynetta Emery RN, Doroteo Bradford    . Enthesopathy of hip 03/26/2014  . GERD (gastroesophageal reflux disease)   . Hiatal hernia   . HTN (hypertension)   . Hypokalemia 06/2018  . Hyponatremia 06/2018  . LBP (low back pain) 03/26/2014  . Neck pain 11/01/2013  . Parkinson disease ([place])   . Pneumonia 06/2018  . Sinusitis 02/07/2015  . Skin lesions 07/12/2014  . Urinary incontinence    mixed     Past Surgical History:  Procedure Laterality Date  . ABDOMINAL HYSTERECTOMY    . BREAST BIOPSY  2013  . BREAST ENHANCEMENT SURGERY  1987  . BREAST IMPLANT REMOVAL    . BREAST IMPLANT REMOVAL Right 08/29/2012  . BREAST SURGERY  1986   s/p left mastectomy  . COLONOSCOPY WITH PROPOFOL N/A 09/13/2016   Procedure: COLONOSCOPY WITH PROPOFOL;  Surgeon: Manya Silvas, MD;  [facility]  [facility] ENDOSCOPY;  Service: Endoscopy;  Laterality: N/A;  . MASTECTOMY  08/2012   right  . TONSILLECTOMY AND ADENOIDECTOMY  79  . VESICOVAGINAL FISTULA CLOSURE W/ TAH  1983    Family History  Problem Relation Age of Onset  . Diabetes Father   . Stroke Father   .  Colon polyps Father   . Stroke Mother   . Parkinson's disease Mother     SOCIAL HX: reviewed.    Current Outpatient Medications:  .  acyclovir ointment (ZOVIRAX) 5 %, Apply 1 application topically 4 (four) times daily., Disp: 15 g, Rfl: 0 .  amLODipine (NORVASC) 2.5 MG tablet, TAKE 1 TABLET BY MOUTH ONCE DAILY., Disp: 30 tablet, Rfl: 0 .  amoxicillin (AMOXIL) 875 MG tablet, Take 1 tablet (875 mg total) by mouth 2 (two) times daily., Disp: 20 tablet, Rfl: 0 .  aspirin 81 MG EC tablet, Take 81 mg by mouth daily as needed.  , Disp: , Rfl:  .  Calcium Carbonate (CALCIUM 600) 1500 MG TABS, Take 1 tablet by mouth daily.   , Disp: , Rfl:  .  citalopram (CELEXA) 20 MG tablet, TAKE 1 & 1/2 TABLETS BY MOUTH ONCE DAILY, Disp: , Rfl:  .  cyclobenzaprine (FLEXERIL) 10 MG tablet, Take 1 tablet (10 mg total) by mouth at bedtime., Disp: 30 tablet, Rfl: 2 .  gabapentin (NEURONTIN) 100 MG capsule, Take 200 mg by mouth 3 (three) times daily. , Disp: , Rfl: 1 .  GLUCOSAMINE SULFATE PO, Take by mouth daily. , Disp: , Rfl:  .  hydrochlorothiazide (HYDRODIURIL) 25 MG tablet, Take 0.5 tablets by mouth daily., Disp: , Rfl: 10 .  [START ON 06/10/2019] HYDROcodone-acetaminophen (NORCO/VICODIN) 5-325 MG tablet, Take 1 tablet by mouth every 8 (eight) hours as needed for up to 30 days for severe pain., Disp: 90 tablet, Rfl: 0 .  [START ON 05/11/2019] HYDROcodone-acetaminophen (NORCO/VICODIN) 5-325 MG tablet, Take 1 tablet by mouth every 8 (eight) hours as needed for up to 30 days for severe pain., Disp: 90 tablet, Rfl: 0 .  HYDROcodone-acetaminophen (NORCO/VICODIN) 5-325 MG tablet, Take 1 tablet by mouth every 8 (eight) hours as needed for up to 30 days for severe pain., Disp: 90 tablet, Rfl: 0 .  letrozole (FEMARA) 2.5 MG tablet, Take 2.5 mg by mouth daily., Disp: , Rfl:  .  losartan (COZAAR) 100 MG tablet, TAKE 1 TABLET BY MOUTH ONCE DAILY., Disp: 30 tablet, Rfl: 0 .  lubiprostone (AMITIZA) 24 MCG capsule, TAKE 1 CAPSULE BY MOUTH ONCE DAILY WITH BREAKFAST., Disp: , Rfl:  .  magnesium oxide (MAG-OX) 400 (241.3 Mg) MG tablet, Take 1 tablet (400 mg total) by mouth daily., Disp: 30 tablet, Rfl: 2 .  meloxicam (MOBIC) 7.5 MG tablet, Take 1 tablet (7.5 mg total) by mouth daily., Disp: 30 tablet, Rfl: 2 .  metoprolol succinate (TOPROL-XL) 25 MG 24 hr tablet, TAKE 1 TABLET BY MOUTH TWICE DAILY, Disp: 60 tablet, Rfl: 10 .  Multiple Vitamin (MULTIVITAMIN) tablet, Take 1 tablet by mouth daily., Disp: , Rfl:  .  Omega-3 1000 MG CAPS, Take by mouth daily. , Disp: , Rfl:  .  omeprazole (PRILOSEC) 20 MG capsule, TAKE 1 CAPSULE BY MOUTH TWICE DAILY FOR  REFLUX., Disp: 60 capsule, Rfl: 1 .  pantoprazole (PROTONIX) 40 MG tablet, TAKE 1 TABLET BY MOUTH ONCE DAILY., Disp: 30 tablet, Rfl: 0 .  pravastatin (PRAVACHOL) 20 MG tablet, TAKE 1 TABLET BY MOUTH ONCE DAILY., Disp: 30 tablet, Rfl: 0  EXAM:  VITALS per patient if applicable:  GENERAL: alert, oriented, appears well and in no acute distress  HEENT: atraumatic, conjunttiva clear, no obvious abnormalities on inspection of external nose and ears  NECK: normal movements of the head and neck  LUNGS: on inspection no signs of respiratory distress, breathing rate appears normal, no obvious gross  SOB, gasping or wheezing  CV: no obvious cyanosis  MS: moves all visible extremities without noticeable abnormality  PSYCH/NEURO: pleasant and cooperative, no obvious depression or anxiety, speech and thought processing grossly intact  ASSESSMENT AND PLAN:  Discussed the following assessment and plan:  Anemia, unspecified type  CKD (chronic kidney disease) stage 3, GFR 30-59 ml/min (HCC)  Essential hypertension  Gastroesophageal reflux disease, esophagitis presence not specified  Hypercholesterolemia  Malignant neoplasm of right female breast, unspecified estrogen receptor status, unspecified site of breast ([place])  Parkinsons ([place])  Left ear pain  Anemia Colonoscopy 08/2016 - diverticulosis and internal hemorrhoids.  Follow cbc.   CKD (chronic kidney disease) stage 3, GFR 30-59 ml/min (HCC) She is back on hctz.  Discussed importance of staying hydrated.  Avoid antiinflammatories.  Follow metabolic panel.    Essential hypertension Blood pressure has been ok.  Follow pressures.  Follow metabolic panel.    GERD (gastroesophageal reflux disease) Controlled on current regimen.  Follow.    Hypercholesterolemia On pravastatin.  Low cholesterol diet and exercise.  Follow lipid panel and liver function tests.    Malignant neoplasm of right female breast [facility]) Being followed by  oncology.  On femara.  S/p bilateral mastectomy.   Parkinsons ([place]) Followed by neurology.  Stable.   Left ear pain Left ear pain as outlined.  Concern over persistence with increased nasal congestion.  Steroid nasal spray as directed.  Treat with amoxicillin.  Probiotic as directed.  Follow.  Call with update.     I discussed the assessment and treatment plan with the patient. The patient was provided an opportunity to ask questions and all were answered. The patient agreed with the plan and demonstrated an understanding of the instructions.   The patient was advised to call back or seek an in-person evaluation if the symptoms worsen or if the condition fails to improve as anticipated.  I provided 25 minutes of non-face-to-face time during this encounter.   Einar Pheasant, MD

## 2019-04-10 ENCOUNTER — Encounter: Payer: Medicare Other | Admitting: Internal Medicine

## 2019-04-12 ENCOUNTER — Encounter: Payer: Self-pay | Admitting: Internal Medicine

## 2019-04-12 DIAGNOSIS — H9202 Otalgia, left ear: Secondary | ICD-10-CM | POA: Insufficient documentation

## 2019-04-12 NOTE — Assessment & Plan Note (Signed)
On pravastatin.  Low cholesterol diet and exercise.  Follow lipid panel and liver function tests.   

## 2019-04-12 NOTE — Assessment & Plan Note (Signed)
Being followed by oncology.  On femara.  S/p bilateral mastectomy.

## 2019-04-12 NOTE — Assessment & Plan Note (Signed)
Left ear pain as outlined.  Concern over persistence with increased nasal congestion.  Steroid nasal spray as directed.  Treat with amoxicillin.  Probiotic as directed.  Follow.  Call with update.

## 2019-04-12 NOTE — Assessment & Plan Note (Signed)
Blood pressure has been ok.  Follow pressures.  Follow metabolic panel.

## 2019-04-12 NOTE — Assessment & Plan Note (Signed)
Controlled on current regimen.  Follow.  

## 2019-04-12 NOTE — Assessment & Plan Note (Signed)
She is back on hctz.  Discussed importance of staying hydrated.  Avoid antiinflammatories.  Follow metabolic panel.

## 2019-04-12 NOTE — Assessment & Plan Note (Signed)
Followed by neurology.  Stable  

## 2019-04-12 NOTE — Assessment & Plan Note (Signed)
Colonoscopy 08/2016 - diverticulosis and internal hemorrhoids.  Follow cbc.

## 2019-04-17 ENCOUNTER — Other Ambulatory Visit: Payer: Self-pay | Admitting: Internal Medicine

## 2019-05-15 ENCOUNTER — Other Ambulatory Visit: Payer: Self-pay | Admitting: Internal Medicine

## 2019-05-16 NOTE — Progress Notes (Signed)
[place]  [phone] [phone]  ID: Wanda Martinez OB: 03-23-1935  MR#: [id number]  TYO#:[id number]  Patient Care Team: Einar Pheasant, MD as PCP - General (Internal Medicine)  I connected with Wanda Martinez on 05/24/19 at 11:00 AM EDT by telephone visit and verified that I am speaking with the correct person using two identifiers.   I discussed the limitations, risks, security and privacy concerns of performing an evaluation and management service by telemedicine and the availability of in-person appointments. I also discussed with the patient that there may be a patient responsible charge related to this service. The patient expressed understanding and agreed to proceed.   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  INTERVAL HISTORY: Patient agreed to evaluation via telephone for her routine 50-monthfollow-up.  She currently feels well and is at her baseline. Her Parkinson's symptoms continue to be well controlled with her current medication regimen.  She continues to have chronic back pain.  She has no new neurologic complaints.  She denies any recent fevers.  She has a good appetite and denies weight loss.  She denies any chest pain, shortness of breath, cough, or hemoptysis.  She denies any nausea, vomiting, constipation, or diarrhea.  She has no urinary complaints.  Patient offers no further specific complaints today.  REVIEW OF SYSTEMS:   Review of Systems  Constitutional: Negative.  Negative for fever, malaise/fatigue and weight loss.  Respiratory: Negative.  Negative for cough and shortness of breath.   Cardiovascular: Negative.  Negative for chest pain and leg swelling.  Gastrointestinal: Negative.  Negative  for abdominal pain.  Genitourinary: Negative.   Musculoskeletal: Positive for back pain and joint pain.  Skin: Negative.  Negative for rash.  Neurological: Positive for tremors. Negative for sensory change, speech change, focal weakness and weakness.  Psychiatric/Behavioral: Negative.  Negative for depression. The patient is not nervous/anxious.     As per HPI. Otherwise, a complete review of systems is negative.  PAST MEDICAL HISTORY: Past Medical History:  Diagnosis Date  . Acute postoperative pain 10/25/2017  . Anemia   . Arm pain 07/26/2015  . Arthritis   . Arthritis, degenerative 03/26/2014  . Back pain 11/01/2013  . Bladder infection 06/2018  . [place]    [place]ctomy - left - 1986   . Breast [facility] ([facility]    Mastectomy-right -2014  . CHEST PAIN 04/29/2010   Qualifier: Diagnosis of  By: JWynetta EmeryRN, EDoroteo Bradford   . Chronic cystitis   . Cystocele 02/22/2013  . Cystocele, midline 08/19/2013  . Degeneration of intervertebral disc of lumbosacral region 03/26/2014  . DYSPNEA 04/29/2010   Qualifier: Diagnosis of  By: JWynetta EmeryRN, EDoroteo Bradford   . Enthesopathy of hip 03/26/2014  . GERD (gastroesophageal reflux disease)   . Hiatal hernia   . HTN (hypertension)   . Hypokalemia 06/2018  . Hyponatremia 06/2018  . LBP (low back pain) 03/26/2014  . Neck pain 11/01/2013  . [place] disease [place]   . Pneumonia 06/2018  . Sinusitis 02/07/2015  . Skin lesions 07/12/2014  . Urinary incontinence    mixed     PAST SURGICAL HISTORY: Past Surgical History:  Procedure Laterality Date  . ABDOMINAL HYSTERECTOMY    . BREAST BIOPSY  2013  . BREAST ENHANCEMENT SURGERY  1987  . BREAST IMPLANT REMOVAL    . BREAST IMPLANT REMOVAL Right 08/29/2012  . BREAST SURGERY  1986   s/p left mastectomy  . COLONOSCOPY WITH PROPOFOL N/A 09/13/2016   Procedure: COLONOSCOPY WITH PROPOFOL;  Surgeon: Manya Silvas, MD;  Location: [facility] ENDOSCOPY;  Service: Endoscopy;  Laterality: N/A;  . MASTECTOMY  08/2012   right  .  TONSILLECTOMY AND ADENOIDECTOMY  79  . VESICOVAGINAL FISTULA CLOSURE W/ TAH  1983    FAMILY HISTORY Family History  Problem Relation Age of Onset  . Diabetes Father   . Stroke Father   . Colon polyps Father   . Stroke Mother   . Parkinson's disease Mother        ADVANCED DIRECTIVES:    HEALTH MAINTENANCE: Social History   Tobacco Use  . Smoking status: Never Smoker  . Smokeless tobacco: Never Used  . Tobacco comment: tobacco use - no  Substance Use Topics  . Alcohol use: No    Alcohol/week: 0.0 standard drinks  . Drug use: No     Allergies  Allergen Reactions  . Sulfa Antibiotics Nausea Only  . Vesicare [Solifenacin] Other (See Comments)    Constipation Constipation    Current Outpatient Medications  Medication Sig Dispense Refill  . acyclovir ointment (ZOVIRAX) 5 % Apply 1 application topically 4 (four) times daily. 15 g 0  . amLODipine (NORVASC) 2.5 MG tablet TAKE 1 TABLET BY MOUTH ONCE DAILY. 90 tablet 1  . amoxicillin (AMOXIL) 875 MG tablet Take 1 tablet (875 mg total) by mouth 2 (two) times daily. 20 tablet 0  . aspirin 81 MG EC tablet Take 81 mg by mouth daily as needed.      . Calcium Carbonate (CALCIUM 600) 1500 MG TABS Take 1 tablet by mouth daily.      . citalopram (CELEXA) 20 MG tablet TAKE 1 & 1/2 TABLETS BY MOUTH ONCE DAILY    . cyclobenzaprine (FLEXERIL) 10 MG tablet Take 1 tablet (10 mg total) by mouth at bedtime. 30 tablet 2  . gabapentin (NEURONTIN) 100 MG capsule Take 200 mg by mouth 3 (three) times daily.   1  . GLUCOSAMINE SULFATE PO Take by mouth daily.     . hydrochlorothiazide (HYDRODIURIL) 25 MG tablet TAKE 1 TABLET BY MOUTH ONCE DAILY. (PATIENT TAKES 1/2 ONCE DAILY AND EXTRA 1/2 IF NEEDED) 15 tablet 10  . [START ON 06/10/2019] HYDROcodone-acetaminophen (NORCO/VICODIN) 5-325 MG tablet Take 1 tablet by mouth every 8 (eight) hours as needed for up to 30 days for severe pain. 90 tablet 0  . HYDROcodone-acetaminophen (NORCO/VICODIN) 5-325 MG  tablet Take 1 tablet by mouth every 8 (eight) hours as needed for up to 30 days for severe pain. 90 tablet 0  . letrozole (FEMARA) 2.5 MG tablet Take 2.5 mg by mouth daily.    Marland Kitchen losartan (COZAAR) 100 MG tablet TAKE 1 TABLET BY MOUTH ONCE DAILY. 90 tablet 1  . lubiprostone (AMITIZA) 24 MCG capsule TAKE 1 CAPSULE BY MOUTH ONCE DAILY WITH BREAKFAST.    . magnesium oxide (MAG-OX) 400 (241.3 Mg) MG tablet Take 1 tablet (400 mg total) by mouth daily. 30 tablet 2  . meloxicam (MOBIC) 7.5 MG tablet Take 1 tablet (7.5 mg total) by mouth daily. 30 tablet 2  . metoprolol succinate (TOPROL-XL) 25 MG 24 hr tablet TAKE 1 TABLET BY MOUTH TWICE DAILY 60 tablet 10  . Multiple Vitamin (MULTIVITAMIN) tablet Take 1 tablet by mouth daily.    . Omega-3 1000 MG  CAPS Take by mouth daily.     Marland Kitchen omeprazole (PRILOSEC) 20 MG capsule TAKE 1 CAPSULE BY MOUTH TWICE DAILY FOR REFLUX. 60 capsule 1  . pantoprazole (PROTONIX) 40 MG tablet TAKE 1 TABLET BY MOUTH ONCE DAILY. 90 tablet 1  . pravastatin (PRAVACHOL) 20 MG tablet TAKE 1 TABLET BY MOUTH ONCE DAILY. 90 tablet 1  . HYDROcodone-acetaminophen (NORCO/VICODIN) 5-325 MG tablet Take 1 tablet by mouth every 8 (eight) hours as needed for up to 30 days for severe pain. 90 tablet 0   No current facility-administered medications for this visit.     OBJECTIVE: There were no vitals filed for this visit.   There is no height or weight on file to calculate BMI.    ECOG FS:0 - Asymptomatic  LAB RESULTS:  Lab Results  Component Value Date   NA 137 01/08/2019   K 4.5 01/08/2019   CL 99 01/08/2019   CO2 33 (H) 09/25/2018   GLUCOSE 90 01/08/2019   BUN 14 01/08/2019   CREATININE 1.16 (H) 01/08/2019   CALCIUM 9.8 01/08/2019   PROT 7.1 01/08/2019   ALBUMIN 4.5 01/08/2019   AST 28 01/08/2019   ALT 25 07/09/2018   ALKPHOS 68 01/08/2019   BILITOT 0.4 01/08/2019   GFRNONAA 44 (L) 01/08/2019   GFRAA 51 (L) 01/08/2019    Lab Results  Component Value Date   WBC 5.7 07/09/2018    NEUTROABS 3.5 07/09/2018   HGB 11.8 (L) 07/09/2018   HCT 34.9 (L) 07/09/2018   MCV 92.6 07/09/2018   PLT 432.0 (H) 07/09/2018     STUDIES: No results found.  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  PLAN:    1.  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  Patient completed 5 years of letrozole in May 2019.  No further interventions are needed.  Patient has had bilateral mastectomies, therefore no further mammograms are necessary.  Return to clinic in 1 year for routine evaluation. 2.  Osteoporosis: Patient's most recent bone mineral density on July 10, 2018 revealed a T score of -2.6 which is slightly worse than 1 year prior.  Patient last received Prolia in October 2018.  Will repeat bone mineral density in July 2020 and if it continues to get worse, patient will return back to clinic to reinitiate Prolia.  Continue calcium and vitamin D supplementation. 3.  Tremors/Parkinson's: Stable. Treatment per neurology.   I provided 15 minutes of non face-to-face telephone visit time during this encounter, and > 50% was spent counseling as documented under my assessment & plan.   Patient expressed understanding and was in agreement with this plan. She also understands that She can call clinic at any time with any questions, concerns, or complaints.   Breast cancer [facility])   Staging form: Breast, AJCC 7th Edition     Clinical stage from 09/22/2015: Stage IIA (T1c, N1, M0) - Signed by Lloyd Huger, MD on 09/22/2015   Lloyd Huger, MD   05/24/2019 6:38 AM

## 2019-05-20 ENCOUNTER — Other Ambulatory Visit: Payer: Self-pay

## 2019-05-21 ENCOUNTER — Inpatient Hospital Stay: Payer: Medicare Other | Attending: Oncology | Admitting: Oncology

## 2019-05-21 DIAGNOSIS — I10 Essential (primary) hypertension: Secondary | ICD-10-CM

## 2019-05-21 DIAGNOSIS — Z9013 Acquired absence of bilateral breasts and nipples: Secondary | ICD-10-CM

## 2019-05-21 DIAGNOSIS — C50211 Malignant neoplasm of upper-inner quadrant of right female breast: Secondary | ICD-10-CM

## 2019-05-21 DIAGNOSIS — Z7982 Long term (current) use of aspirin: Secondary | ICD-10-CM | POA: Diagnosis not present

## 2019-05-21 DIAGNOSIS — Z79899 Other long term (current) drug therapy: Secondary | ICD-10-CM | POA: Diagnosis not present

## 2019-05-21 DIAGNOSIS — Z79811 Long term (current) use of aromatase inhibitors: Secondary | ICD-10-CM

## 2019-05-21 DIAGNOSIS — Z170 Estrogen receptor positive status [ER+]: Secondary | ICD-10-CM | POA: Diagnosis not present

## 2019-05-21 NOTE — Progress Notes (Signed)
Telephone visit today for follow up regarding breast cancer. Patient reports worsening back pain and states her hand and feet ache.

## 2019-05-26 ENCOUNTER — Other Ambulatory Visit: Payer: Self-pay | Admitting: Pain Medicine

## 2019-06-02 ENCOUNTER — Other Ambulatory Visit
Admission: RE | Admit: 2019-06-02 | Discharge: 2019-06-02 | Disposition: A | Discharge status: Home / Self Care | Payer: Medicare Other | Source: Ambulatory Visit | Attending: Pain Medicine | Admitting: Pain Medicine

## 2019-06-02 ENCOUNTER — Other Ambulatory Visit: Payer: Self-pay

## 2019-06-02 DIAGNOSIS — Z1159 Encounter for screening for other viral diseases: Secondary | ICD-10-CM | POA: Diagnosis not present

## 2019-06-03 LAB — NOVEL CORONAVIRUS, NAA (HOSP ORDER, SEND-OUT TO REF LAB; TAT 18-24 HRS): SARS-CoV-2, NAA: NOT DETECTED

## 2019-06-05 ENCOUNTER — Ambulatory Visit
Admission: RE | Admit: 2019-06-05 | Discharge: 2019-06-05 | Disposition: A | Discharge status: Home / Self Care | Payer: Medicare Other | Source: Ambulatory Visit | Attending: Pain Medicine | Admitting: Pain Medicine

## 2019-06-05 ENCOUNTER — Encounter: Payer: Self-pay | Admitting: Pain Medicine

## 2019-06-05 ENCOUNTER — Ambulatory Visit (HOSPITAL_BASED_OUTPATIENT_CLINIC_OR_DEPARTMENT_OTHER): Payer: Medicare Other | Admitting: Pain Medicine

## 2019-06-05 ENCOUNTER — Other Ambulatory Visit: Payer: Self-pay

## 2019-06-05 VITALS — BP 167/69 | HR 61 | Temp 96.9°F | Resp 16 | Ht 64.0 in | Wt 180.0 lb

## 2019-06-05 DIAGNOSIS — M150 Primary generalized (osteo)arthritis: Secondary | ICD-10-CM | POA: Insufficient documentation

## 2019-06-05 DIAGNOSIS — G8929 Other chronic pain: Secondary | ICD-10-CM | POA: Insufficient documentation

## 2019-06-05 DIAGNOSIS — M47818 Spondylosis without myelopathy or radiculopathy, sacral and sacrococcygeal region: Secondary | ICD-10-CM

## 2019-06-05 DIAGNOSIS — G894 Chronic pain syndrome: Secondary | ICD-10-CM

## 2019-06-05 DIAGNOSIS — G8918 Other acute postprocedural pain: Secondary | ICD-10-CM

## 2019-06-05 DIAGNOSIS — M5388 Other specified dorsopathies, sacral and sacrococcygeal region: Secondary | ICD-10-CM | POA: Insufficient documentation

## 2019-06-05 DIAGNOSIS — M533 Sacrococcygeal disorders, not elsewhere classified: Secondary | ICD-10-CM | POA: Diagnosis not present

## 2019-06-05 DIAGNOSIS — M7918 Myalgia, other site: Secondary | ICD-10-CM | POA: Insufficient documentation

## 2019-06-05 DIAGNOSIS — M461 Sacroiliitis, not elsewhere classified: Secondary | ICD-10-CM

## 2019-06-05 DIAGNOSIS — M159 Polyosteoarthritis, unspecified: Secondary | ICD-10-CM

## 2019-06-05 MED ORDER — OXYCODONE-ACETAMINOPHEN 5-325 MG PO TABS: 1.0000 | ORAL_TABLET | Freq: Four times a day (QID) | ORAL | 0 refills | Status: AC | PRN

## 2019-06-05 MED ORDER — HYDROCODONE-ACETAMINOPHEN 5-325 MG PO TABS: 1.0000 | ORAL_TABLET | Freq: Three times a day (TID) | ORAL | 0 refills | Status: DC | PRN

## 2019-06-05 MED ORDER — TRIAMCINOLONE ACETONIDE 40 MG/ML IJ SUSP
40.0000 mg | Freq: Once | INTRAMUSCULAR | Status: AC
  Administered 2019-06-05: 40 mg
  Filled 2019-06-05: qty 1

## 2019-06-05 MED ORDER — LACTATED RINGERS IV SOLN
1000.0000 mL | Freq: Once | INTRAVENOUS | Status: AC
  Administered 2019-06-05: 1000 mL via INTRAVENOUS

## 2019-06-05 MED ORDER — MELOXICAM 7.5 MG PO TABS: 7.5000 mg | ORAL_TABLET | Freq: Every day | ORAL | 0 refills | Status: DC

## 2019-06-05 MED ORDER — FENTANYL CITRATE (PF) 100 MCG/2ML IJ SOLN
25.0000 ug | INTRAMUSCULAR | Status: DC | PRN
  Administered 2019-06-05: 50 ug via INTRAVENOUS
  Filled 2019-06-05: qty 2

## 2019-06-05 MED ORDER — MAGNESIUM OXIDE 400 (241.3 MG) MG PO TABS: 1.0000 | ORAL_TABLET | Freq: Every day | ORAL | 0 refills | Status: DC

## 2019-06-05 MED ORDER — MIDAZOLAM HCL 5 MG/5ML IJ SOLN
1.0000 mg | INTRAMUSCULAR | Status: DC | PRN
  Administered 2019-06-05 (×2): 1 mg via INTRAVENOUS
  Filled 2019-06-05: qty 5

## 2019-06-05 MED ORDER — CYCLOBENZAPRINE HCL 10 MG PO TABS: 10.0000 mg | ORAL_TABLET | Freq: Every day | ORAL | 0 refills | Status: DC

## 2019-06-05 MED ORDER — ROPIVACAINE HCL 2 MG/ML IJ SOLN
9.0000 mL | Freq: Once | INTRAMUSCULAR | Status: AC
  Administered 2019-06-05: 9 mL via PERINEURAL
  Filled 2019-06-05: qty 10

## 2019-06-05 MED ORDER — LIDOCAINE HCL 2 % IJ SOLN
20.0000 mL | Freq: Once | INTRAMUSCULAR | Status: AC
  Administered 2019-06-05: 400 mg

## 2019-06-05 NOTE — Patient Instructions (Signed)
___________________________________________________________________________________________  Post-Radiofrequency (RF) Discharge Instructions  You have just completed a Radiofrequency Neurotomy.  The following instructions will provide you with information and guidelines for self-care upon discharge.  If at any time you have questions or concerns please call your physician. DO NOT DRIVE YOURSELF!!  Instructions:  Apply ice: Fill a plastic sandwich bag with crushed ice. Cover it with a small towel and apply to injection site. Apply for 15 minutes then remove x 15 minutes. Repeat sequence on day of procedure, until you go to bed. The purpose is to minimize swelling and discomfort after procedure.  Apply heat: Apply heat to procedure site starting the day following the procedure. The purpose is to treat any soreness and discomfort from the procedure.  Food intake: No eating limitations, unless stipulated above.  Nevertheless, if you have had sedation, you may experience some nausea.  In this case, it may be wise to wait at least two hours prior to resuming regular diet.  Physical activities: Keep activities to a minimum for the first 8 hours after the procedure. For the first 24 hours after the procedure, do not drive a motor vehicle,  Operate heavy machinery, power tools, or handle any weapons.  Consider walking with the use of an assistive device or accompanied by an adult for the first 24 hours.  Do not drink alcoholic beverages including beer.  Do not make any important decisions or sign any legal documents. Go home and rest today.  Resume activities tomorrow, as tolerated.  Use caution in moving about as you may experience mild leg weakness.  Use caution in cooking, use of household electrical appliances and climbing steps.  Driving: If you have received any sedation, you are not allowed to drive for 24 hours after your procedure.  Blood thinner: Restart your blood thinner 6 hours after your  procedure. (Only for those taking blood thinners)  Insulin: As soon as you can eat, you may resume your normal dosing schedule. (Only for those taking insulin)  Medications: May resume pre-procedure medications.  Do not take any drugs, other than what has been prescribed to you.  Infection prevention: Keep procedure site clean and dry.  Post-procedure Pain Diary: Extremely important that this be done correctly and accurately. Recorded information will be used to determine the next step in treatment.  Pain evaluated is that of treated area only. Do not include pain from an untreated area.  Complete every hour, on the hour, for the initial 8 hours. Set an alarm to help you do this part accurately.  Do not go to sleep and have it completed later. It will not be accurate.  Follow-up appointment: Keep your follow-up appointment after the procedure. Usually 2 weeks for most procedures. (6 weeks in the case of radiofrequency.) Bring you pain diary.   Expect:  From numbing medicine (AKA: Local Anesthetics): Numbness or decrease in pain.  Onset: Full effect within 15 minutes of injected.  Duration: It will depend on the type of local anesthetic used. On the average, 1 to 8 hours.   From steroids: Decrease in swelling or inflammation. Once inflammation is improved, relief of the pain will follow.  Onset of benefits: Depends on the amount of swelling present. The more swelling, the longer it will take for the benefits to be seen. In some cases, up to 10 days.  Duration: Steroids will stay in the system x 2 weeks. Duration of benefits will depend on multiple posibilities including persistent irritating factors.  From procedure: Some   discomfort is to be expected once the numbing medicine wears off. This should be minimal if ice and heat are applied as instructed.  Call if:  You experience numbness and weakness that gets worse with time, as opposed to wearing off.  He experience any unusual  bleeding, difficulty breathing, or loss of the ability to control your bowel and bladder. (This applies to Spinal procedures only)  You experience any redness, swelling, heat, red streaks, elevated temperature, fever, or any other signs of a possible infection.  Emergency Numbers:  Durning business hours (Monday - Thursday, 8:00 AM - 4:00 PM) (Friday, 9:00 AM - 12:00 Noon): [phone]  After hours: [phone] ____________________________________________________________________________________________    

## 2019-06-05 NOTE — Progress Notes (Signed)
Patient's Name: Wanda Martinez  MRN: [id number]  Referring Provider: Einar Pheasant, MD  DOB: [birth year]  PCP: Wanda Pheasant, MD  DOS: 06/05/2019  Note by: Wanda Cola, MD  Service setting: Ambulatory outpatient  Specialty: Interventional Pain Management  Patient type: Established  Location: [facility] (AMB) Pain Management Facility  Visit type: Interventional Procedure   Primary Reason for Visit: Interventional Pain Management Treatment. CC: Back Pain  Procedure:          Anesthesia, Analgesia, Anxiolysis:  Type: Thermal Sacroiliac joint Radiofrequency Ablation (medial branch of L5, S1, S2, and S3)   #3 (last done on 10/24/2018) Region: Lumbosacral Level: L5, S1, S2, & S3 Medial Branch Level(s). These levels will denervate the posterior Sacroiliac Joint innervation. Primary Purpose: Therapeutic Region: Posterolateral Lumbosacral Spine Laterality: Right  Type: Moderate (Conscious) Sedation combined with Local Anesthesia Indication(s): Analgesia and Anxiety Route: Intravenous (IV) IV Access: Secured Sedation: Meaningful verbal contact was maintained at all times during the procedure  Local Anesthetic: Lidocaine 1-2%  Position: Supine Prone   Indications: 1. Chronic sacroiliac joint pain (Right)   2. Osteoarthritis of sacroiliac joint (Right)   3. Other specified dorsopathies, sacral and sacrococcygeal region    Wanda Martinez has been dealing with the above chronic pain for longer than three months and has either failed to respond, was unable to tolerate, or simply did not get enough benefit from other more conservative therapies including, but not limited to: 1. Over-the-counter medications 2. Anti-inflammatory medications 3. Muscle relaxants 4. Membrane stabilizers 5. Opioids 6. Physical therapy and/or chiropractic manipulation 7. Modalities (Heat, ice, etc.) 8. Invasive techniques such as nerve blocks. Wanda Martinez has attained more than 50% relief of the pain from a series  of diagnostic injections conducted in separate occasions.  Pain Score: Pre-procedure: 6 /10 Post-procedure: 0-No pain/10  Pharmacotherapy Assessment  Analgesic: Hydrocodone/APAP 5/325 one tablet every 8 hours MME/day:15mg /day.  Monitoring: Pharmacotherapy: No side-effects or adverse reactions reported. [place] PMP: PDMP reviewed during this encounter.       Compliance: No problems identified. Effectiveness: Clinically acceptable. Plan: Refer to "POC".  Pre-op Assessment:  Wanda Martinez is a 84 y.o. (year old), female patient, seen today for interventional treatment. She  has a past surgical history that includes Breast biopsy (2013); Tonsillectomy and adenoidectomy (79); Vesicovaginal fistula closure w/ TAH (1983); Breast surgery (1986); Breast enhancement surgery (1987); Breast implant removal; Breast implant removal (Right, 08/29/2012); Mastectomy (08/2012); Abdominal hysterectomy; and Colonoscopy with propofol (N/A, 09/13/2016). Wanda Martinez has a current medication list which includes the following prescription(s): acyclovir ointment, amlodipine, aspirin, calcium carbonate, citalopram, cyclobenzaprine, gabapentin, glucosamine sulfate, hydrochlorothiazide, hydrocodone-acetaminophen, hydrocodone-acetaminophen, letrozole, losartan, lubiprostone, magnesium oxide, meloxicam, metoprolol succinate, multivitamin, omega-3, omeprazole, pantoprazole, pravastatin, amoxicillin, oxycodone-acetaminophen, and oxycodone-acetaminophen, and the following Facility-Administered Medications: fentanyl and midazolam. Her primarily concern today is the Back Pain  Initial Vital Signs:  Pulse/HCG Rate: 61ECG Heart Rate: 64 Temp: 98.2 F (36.8 C) Resp: 16 BP: 126/73 SpO2: 100 %  BMI: Estimated body mass index is 30.9 kg/m as calculated from the following:   Height as of this encounter: 5\' 4"  (1.626 m).   Weight as of this encounter: 180 lb (81.6 kg).  Risk Assessment: Allergies: Reviewed. She is allergic to sulfa  antibiotics and vesicare [solifenacin].  Allergy Precautions: None required Coagulopathies: Reviewed. None identified.  Blood-thinner therapy: None at this time Active Infection(s): Reviewed. None identified. Wanda Martinez is afebrile  Site Confirmation: Wanda Martinez was asked to confirm the procedure and laterality before marking the site Procedure checklist:  Completed Consent: Before the procedure and under the influence of no sedative(s), amnesic(s), or anxiolytics, the patient was informed of the treatment options, risks and possible complications. To fulfill our ethical and legal obligations, as recommended by the American Medical Association's Code of Ethics, I have informed the patient of my clinical impression; the nature and purpose of the treatment or procedure; the risks, benefits, and possible complications of the intervention; the alternatives, including doing nothing; the risk(s) and benefit(s) of the alternative treatment(s) or procedure(s); and the risk(s) and benefit(s) of doing nothing. The patient was provided information about the general risks and possible complications associated with the procedure. These may include, but are not limited to: failure to achieve desired goals, infection, bleeding, organ or nerve damage, allergic reactions, paralysis, and death. In addition, the patient was informed of those risks and complications associated to the procedure, such as failure to decrease pain; infection; bleeding; organ or nerve damage with subsequent damage to sensory, motor, and/or autonomic systems, resulting in permanent pain, numbness, and/or weakness of one or several areas of the body; allergic reactions; (i.e.: anaphylactic reaction); and/or death. Furthermore, the patient was informed of those risks and complications associated with the medications. These include, but are not limited to: allergic reactions (i.e.: anaphylactic or anaphylactoid reaction(s)); adrenal axis suppression;  blood sugar elevation that in diabetics may result in ketoacidosis or comma; water retention that in patients with history of congestive heart failure may result in shortness of breath, pulmonary edema, and decompensation with resultant heart failure; weight gain; swelling or edema; medication-induced neural toxicity; particulate matter embolism and blood vessel occlusion with resultant organ, and/or nervous system infarction; and/or aseptic necrosis of one or more joints. Finally, the patient was informed that Medicine is not an exact science; therefore, there is also the possibility of unforeseen or unpredictable risks and/or possible complications that may result in a catastrophic outcome. The patient indicated having understood very clearly. We have given the patient no guarantees and we have made no promises. Enough time was given to the patient to ask questions, all of which were answered to the patient's satisfaction. Ms. Dede has indicated that she wanted to continue with the procedure. Attestation: I, the ordering provider, attest that I have discussed with the patient the benefits, risks, side-effects, alternatives, likelihood of achieving goals, and potential problems during recovery for the procedure that I have provided informed consent. Date  Time: 06/05/2019 10:40 AM  Pre-Procedure Preparation:  Monitoring: As per clinic protocol. Respiration, ETCO2, SpO2, BP, heart rate and rhythm monitor placed and checked for adequate function Safety Precautions: Patient was assessed for positional comfort and pressure points before starting the procedure. Time-out: I initiated and conducted the "Time-out" before starting the procedure, as per protocol. The patient was asked to participate by confirming the accuracy of the "Time Out" information. Verification of the correct person, site, and procedure were performed and confirmed by me, the nursing staff, and the patient. "Time-out" conducted as per Joint  Commission's Universal Protocol (UP.01.01.01). Time: 1134  Description of Procedure:          Laterality: Right Level: L5, S1, S2, & S3 Medial Branch Level(s), at the posterior Sacroiliac Joint Area Prepped: Lumbosacral Prepping solution: DuraPrep (Iodine Povacrylex [0.7% available iodine] and Isopropyl Alcohol, 74% w/w) Safety Precautions: Aspiration looking for blood return was conducted prior to all injections. At no point did we inject any substances, as a needle was being advanced. Before injecting, the patient was told to immediately notify me if  she was experiencing any new onset of "ringing in the ears, or metallic taste in the mouth". No attempts were made at seeking any paresthesias. Safe injection practices and needle disposal techniques used. Medications properly checked for expiration dates. SDV (single dose vial) medications used. After the completion of the procedure, all disposable equipment used was discarded in the proper designated medical waste containers. Local Anesthesia: Protocol guidelines were followed. The patient was positioned over the fluoroscopy table. The area was prepped in the usual manner. The time-out was completed. The target area was identified using fluoroscopy. A 12-in long, straight, sterile hemostat was used with fluoroscopic guidance to locate the targets for each level blocked. Once located, the skin was marked with an approved surgical skin marker. Once all sites were marked, the skin (epidermis, dermis, and hypodermis), as well as deeper tissues (fat, connective tissue and muscle) were infiltrated with a small amount of a short-acting local anesthetic, loaded on a 10cc syringe with a 25G, 1.5-in  Needle. An appropriate amount of time was allowed for local anesthetics to take effect before proceeding to the next step. Local Anesthetic: Lidocaine 2.0% The unused portion of the local anesthetic was discarded in the proper designated containers. Technical  explanation of process:  Radiofrequency Ablation (RFA) L5 Medial Branch Nerve RFA: The target area for the L5 medial branch is at the junction of the postero-lateral aspect of the superior articular process of S1 and the superior, posterior, and medial edge of the sacral ala. Under fluoroscopic guidance, a Radiofrequency needle was inserted until contact was made with os over the superior postero-lateral aspect of the pedicular shadow (target area). Sensory and motor testing was conducted to properly adjust the position of the needle. Once satisfactory placement of the needle was achieved, the numbing solution was slowly injected after negative aspiration for blood. 2.0 mL of the nerve block solution was injected without difficulty or complication. After waiting for at least 3 minutes, the ablation was performed. Once completed, the needle was removed intact. S1 Primary Dorsal Rami and Lateral Branch Nerve RFA: The target area for the S1 medial branch is located inferior to the junction of the S1 superior articular process and the L5 inferior articular process, posterior, inferior, and lateral to the 6 o'clock position of the L5-S1 facet joint, just superior to the S1 posterior foramen. Under fluoroscopic guidance, the Radiofrequency needle was advanced until contact was made with os over the Target area. Sensory and motor testing was conducted to properly adjust the position of the needle. Once satisfactory placement of the needle was achieved, the numbing solution was slowly injected after negative aspiration for blood. 2.0 mL of the nerve block solution was injected without difficulty or complication. After waiting for at least 3 minutes, the ablation was performed. Once completed, the needle was removed intact. S2 Primary Dorsal Rami and Lateral Branch Nerve RFA: The target area for the S2 medial branch is at the posterior superior lateral of the S2 posterior neural foramen. Under fluoroscopic guidance, the  Radiofrequency needle was advanced until contact was made with os over the Target area. Sensory and motor testing was conducted to properly adjust the position of the needle. Once satisfactory placement of the needle was achieved, the numbing solution was slowly injected after negative aspiration for blood. 2.0 mL of the nerve block solution was injected without difficulty or complication. After waiting for at least 3 minutes, the ablation was performed. Once completed, the needle was removed intact. S3 Primary Dorsal Rami and  Lateral Branch Nerve RFA: The target area for the S3 medial branch is at the posterior superior lateral of the S3 posterior neural foramen. Under fluoroscopic guidance, the Radiofrequency needle was advanced until contact was made with os over the Target area. Sensory and motor testing was conducted to properly adjust the position of the needle. Once satisfactory placement of the needle was achieved, the numbing solution was slowly injected after negative aspiration for blood. 2.0 mL of the nerve block solution was injected without difficulty or complication. After waiting for at least 3 minutes, the ablation was performed. Once completed, the needle was removed intact. Radiofrequency lesioning (ablation):  Radiofrequency Generator: NeuroTherm [id number] Sensory Stimulation Parameters: 50 Hz was used to locate & identify the nerve, making sure that the needle was positioned such that there was no sensory stimulation below 0.3 V or above 0.7 V. Motor Stimulation Parameters: 2 Hz was used to evaluate the motor component. Care was taken not to lesion any nerves that demonstrated motor stimulation of the lower extremities at an output of less than 2.5 times that of the sensory threshold, or a maximum of 2.0 V. Lesioning Technique Parameters: Standard Radiofrequency settings. (Not bipolar or pulsed.) Temperature Settings: 80 degrees C Lesioning time: 60 seconds Intra-operative Compliance:  Compliant Materials & Medications: Needle(s) (Electrode/Cannula) Type: Teflon-coated, curved tip, Radiofrequency needle(s) Gauge: 22G Length: 10cm Numbing solution: 0.2% PF-Ropivacaine + Triamcinolone (40 mg/mL) diluted to a final concentration of 4 mg of Triamcinolone/mL of Ropivacaine The unused portion of the solution was discarded in the proper designated containers.   Once the entire procedure was completed, the treated area was cleaned, making sure to leave some of the prepping solution back to take advantage of its long term bactericidal properties. Intra-operative Compliance: Compliant  Vitals:   06/05/19 1217 06/05/19 1227 06/05/19 1237 06/05/19 1246  BP: (!) 153/96 (!) 147/66 (!) 159/65 (!) 167/69  Pulse:      Resp: 13 16 16 16   Temp:  (!) 96.9 F (36.1 C)  (!) 96.9 F (36.1 C)  TempSrc:      SpO2: 97% 98% 100% 100%  Weight:      Height:        Start Time: 1134 hrs. End Time: 1215 hrs.  Imaging Guidance (Non-Spinal):          Type of Imaging Technique: Fluoroscopy Guidance (Non-Spinal) Indication(s): Assistance in needle guidance and placement for procedures requiring needle placement in or near specific anatomical locations not easily accessible without such assistance. Exposure Time: Please see nurses notes. Contrast: Before injecting any contrast, we confirmed that the patient did not have an allergy to iodine, shellfish, or radiological contrast. Once satisfactory needle placement was completed at the desired level, radiological contrast was injected. Contrast injected under live fluoroscopy. No contrast complications. See chart for type and volume of contrast used. Fluoroscopic Guidance: I was personally present during the use of fluoroscopy. "Tunnel Vision Technique" used to obtain the best possible view of the target area. Parallax error corrected before commencing the procedure. "Direction-depth-direction" technique used to introduce the needle under continuous  pulsed fluoroscopy. Once target was reached, antero-posterior, oblique, and lateral fluoroscopic projection used confirm needle placement in all planes. Images permanently stored in EMR. Interpretation: I personally interpreted the imaging intraoperatively. Adequate needle placement confirmed in multiple planes. Appropriate spread of contrast into desired area was observed. No evidence of afferent or efferent intravascular uptake. Permanent images saved into the patient's record.  Antibiotic Prophylaxis:   Anti-infectives (From admission, onward)  None     Indication(s): None identified  Post-operative Assessment:  Post-procedure Vital Signs:  Pulse/HCG Rate: 6161 Temp: (!) 96.9 F (36.1 C) Resp: 16 BP: (!) 167/69 SpO2: 100 %  EBL: None  Complications: No immediate post-treatment complications observed by team, or reported by patient.  Note: The patient tolerated the entire procedure well. A repeat set of vitals were taken after the procedure and the patient was kept under observation following institutional policy, for this type of procedure. Post-procedural neurological assessment was performed, showing return to baseline, prior to discharge. The patient was provided with post-procedure discharge instructions, including a section on how to identify potential problems. Should any problems arise concerning this procedure, the patient was given instructions to immediately contact us, at any time, without hesitation. In any case, we plan to contact the patient by telephone for a follow-up status report regarding this interventional procedure.  Comments:  No additional relevant information.  Plan of Care  Orders:  Orders Placed This Encounter  Procedures  . Radiofrequency Sacroiliac Joint    Scheduling Instructions:     Side(s): Right-sided     Level(s): L4, L5, S1, S2, & S3 Medial Branch Nerve(s)     Sedation: With Sedation     Timeframe: Today     Procedure: Sacroiliac joint RFA   . DG PAIN [facility] C-ARM 1-60 MIN NO REPORT    Intraoperative interpretation by procedural physician at [place].    Standing Status:   Standing    Number of Occurrences:   1    Order Specific Question:   Reason for exam:    Answer:   Assistance in needle guidance and placement for procedures requiring needle placement in or near specific anatomical locations not easily accessible without such assistance.  . Provider attestation of informed consent for procedure/surgical case    I, the ordering provider, attest that I have discussed with the patient the benefits, risks, side effects, alternatives, likelihood of achieving goals and potential problems during recovery for the procedure that I have provided informed consent.    Standing Status:   Standing    Number of Occurrences:   1  . Informed Consent Details: Transcribe to consent form and obtain patient signature    Consent Attestation: I, the ordering provider, attest that I have discussed with the patient the benefits, risks, side-effects, alternatives, likelihood of achieving goals, and potential problems during recovery for the procedure that I have provided informed consent.    Standing Status:   Standing    Number of Occurrences:   1    Order Specific Question:   Procedure    Answer:   Sacroiliac joint RFA under fluoroscopic guidance    Order Specific Question:   Surgeon    Answer:   Lemoyne Nestor A. Dossie Arbour, MD    Order Specific Question:   Indication/Reason    Answer:   Chronic low back pain secondary to sacroiliac joint arthralgia   Medications ordered for procedure: Meds ordered this encounter  Medications  . lidocaine (XYLOCAINE) 2 % (with pres) injection 400 mg  . lactated ringers infusion 1,000 mL  . midazolam (VERSED) 5 MG/5ML injection 1-2 mg    Make sure Flumazenil is available in the pyxis when using this medication. If oversedation occurs, administer 0.2 mg IV over 15 sec. If after 45 sec no response,  administer 0.2 mg again over 1 min; may repeat at 1 min intervals; not to exceed 4 doses (1 mg)  . fentaNYL (SUBLIMAZE) injection  25-50 mcg    Make sure Narcan is available in the pyxis when using this medication. In the event of respiratory depression (RR< 8/min): Titrate NARCAN (naloxone) in increments of 0.1 to 0.2 mg IV at 2-3 minute intervals, until desired degree of reversal.  . ropivacaine (PF) 2 mg/mL (0.2%) (NAROPIN) injection 9 mL  . triamcinolone acetonide (KENALOG-40) injection 40 mg  . oxyCODONE-acetaminophen (PERCOCET) 5-325 MG tablet    Sig: Take 1 tablet by mouth every 6 (six) hours as needed for up to 7 days for severe pain. Must last 7 days.    Dispense:  28 tablet    Refill:  0    For acute post-operative pain. Not to be refilled. Most last 7 days.  Marland Kitchen oxyCODONE-acetaminophen (PERCOCET) 5-325 MG tablet    Sig: Take 1 tablet by mouth every 6 (six) hours as needed for up to 7 days for severe pain. Must last 7 days.    Dispense:  28 tablet    Refill:  0    For acute post-operative pain. Not to be refilled. Must last 7 days.  . cyclobenzaprine (FLEXERIL) 10 MG tablet    Sig: Take 1 tablet (10 mg total) by mouth at bedtime.    Dispense:  55 tablet    Refill:  0    Fill one day early if pharmacy is closed on scheduled refill date. May substitute for generic if available.  . magnesium oxide (MAG-OX) 400 (241.3 Mg) MG tablet    Sig: Take 1 tablet (400 mg total) by mouth daily.    Dispense:  55 tablet    Refill:  0    Fill one day early if pharmacy is closed on scheduled refill date. May substitute for generic if available.  . meloxicam (MOBIC) 7.5 MG tablet    Sig: Take 1 tablet (7.5 mg total) by mouth daily.    Dispense:  55 tablet    Refill:  0    Fill one day early if pharmacy is closed on scheduled refill date. May substitute for generic if available.  Marland Kitchen HYDROcodone-acetaminophen (NORCO/VICODIN) 5-325 MG tablet    Sig: Take 1 tablet by mouth every 8 (eight) hours as  needed for up to 30 days for severe pain. Must last 30 days    Dispense:  90 tablet    Refill:  0    Chronic Pain: STOP Act - Not applicable. Fill 1 day early if closed on scheduled refill date. Do not fill until: 07/31/2019. To last until: 08/30/2019. Instruct to avoid benzodiazepines within 8 hours of opioid.   Medications administered: We administered lidocaine, lactated ringers, midazolam, fentaNYL, ropivacaine (PF) 2 mg/mL (0.2%), and triamcinolone acetonide.  See the medical record for exact dosing, route, and time of administration.  Disposition: Discharge home  Discharge Date & Time: 06/05/2019; 1246 hrs.   Follow-up plan:   Return in about 3 months (around 08/27/2019) for (VV), E/M (PP), in addition, E/M (MM).     Recent Visits Date Type Provider Dept  04/07/19 Office Visit Vevelyn Francois, NP [facility] recent visits within past 60 days and meeting all other requirements   Today's Visits Date Type Provider Dept  06/05/19 Procedure visit Milinda Pointer, MD [facility]  Showing today's visits and meeting all other requirements   Future Appointments Date Type Provider Dept  07/07/19 Appointment Milinda Pointer, MD [facility]  07/23/19 Appointment Milinda Pointer, MD [facility]  Showing future appointments within next 90 days  and meeting all other requirements   Primary Care Physician: Wanda Pheasant, MD Location: [facility] Note by: Wanda Cola, MD Date: 06/05/2019; Time: 1:03 PM  Disclaimer:  Medicine is not an Chief Strategy Officer. The only guarantee in medicine is that nothing is guaranteed. It is important to note that the decision to proceed with this intervention was based on the information collected from the patient. The Data and conclusions were drawn from the patient's questionnaire, the interview, and the physical examination. Because the information was provided in large  part by the patient, it cannot be guaranteed that it has not been purposely or unconsciously manipulated. Every effort has been made to obtain as much relevant data as possible for this evaluation. It is important to note that the conclusions that lead to this procedure are derived in large part from the available data. Always take into account that the treatment will also be dependent on availability of resources and existing treatment guidelines, considered by other Pain Management Practitioners as being common knowledge and practice, at the time of the intervention. For Medico-Legal purposes, it is also important to point out that variation in procedural techniques and pharmacological choices are the acceptable norm. The indications, contraindications, technique, and results of the above procedure should only be interpreted and judged by a Board-Certified Interventional Pain Specialist with extensive familiarity and expertise in the same exact procedure and technique.

## 2019-06-06 ENCOUNTER — Telehealth: Payer: Self-pay

## 2019-06-06 NOTE — Telephone Encounter (Signed)
Post procedure phone call.  Patient states she didn't sleep much but felt OK from procedure.

## 2019-06-26 ENCOUNTER — Telehealth: Payer: Self-pay

## 2019-06-26 ENCOUNTER — Ambulatory Visit (INDEPENDENT_AMBULATORY_CARE_PROVIDER_SITE_OTHER): Payer: Medicare Other | Admitting: Internal Medicine

## 2019-06-26 ENCOUNTER — Other Ambulatory Visit: Payer: Self-pay

## 2019-06-26 DIAGNOSIS — K5909 Other constipation: Secondary | ICD-10-CM

## 2019-06-26 DIAGNOSIS — F320 Major depressive disorder, single episode, mild: Secondary | ICD-10-CM | POA: Diagnosis not present

## 2019-06-26 DIAGNOSIS — F32A Depression, unspecified: Secondary | ICD-10-CM

## 2019-06-26 DIAGNOSIS — I10 Essential (primary) hypertension: Secondary | ICD-10-CM

## 2019-06-26 DIAGNOSIS — C50211 Malignant neoplasm of upper-inner quadrant of right female breast: Secondary | ICD-10-CM

## 2019-06-26 DIAGNOSIS — K219 Gastro-esophageal reflux disease without esophagitis: Secondary | ICD-10-CM | POA: Diagnosis not present

## 2019-06-26 DIAGNOSIS — E7800 Pure hypercholesterolemia, unspecified: Secondary | ICD-10-CM

## 2019-06-26 DIAGNOSIS — N183 Chronic kidney disease, stage 3 unspecified: Secondary | ICD-10-CM

## 2019-06-26 DIAGNOSIS — D649 Anemia, unspecified: Secondary | ICD-10-CM

## 2019-06-26 DIAGNOSIS — G20 Parkinson's disease: Secondary | ICD-10-CM | POA: Diagnosis not present

## 2019-06-26 MED ORDER — NYSTATIN 100000 UNIT/ML MT SUSP: 5.0000 mL | Freq: Three times a day (TID) | OROMUCOSAL | 0 refills | Status: DC

## 2019-06-26 MED ORDER — SILVER SULFADIAZINE 1 % EX CREA: 1.0000 "application " | TOPICAL_CREAM | Freq: Every day | CUTANEOUS | 0 refills | Status: DC

## 2019-06-26 NOTE — Telephone Encounter (Signed)
Copied from [place] [id number]. Topic: General - Other >> Jun 26, 2019  3:50 PM Mcneil, Ja-Kwan wrote: Reason for CRM: Pt stated she was returning a call to [place]. Pt requests call back. Cb# [phone]

## 2019-06-26 NOTE — Progress Notes (Signed)
Patient ID: Wanda Martinez, female   DOB: 06/17/1935, 84 y.o.   MRN: [id number]   Virtual Visit via telephone Note  This visit type was conducted due to national recommendations for restrictions regarding the COVID-19 pandemic (e.g. social distancing).  This format is felt to be most appropriate for this patient at this time.  All issues noted in this document were discussed and addressed.  No physical exam was performed (except for noted visual exam findings with Video Visits).   I connected with Wanda Martinez by telephone and verified that I am speaking with the correct person using two identifiers. Location patient: home Location provider: work  Persons participating in the telephone visit: patient, provider  I discussed the limitations, risks, security and privacy concerns of performing an evaluation and management service by telephone and the availability of in person appointments. The patient expressed understanding and agreed to proceed.   Reason for visit: scheduled follow up  HPI: She sees oncology for her history of breast cancer. Is s/p bilateral mastectomies.  No further mammograms recommended.  Scheduled for f/u bone density 06/2019.  Last prolia 09/2017.  She reports that she has noticed a coating on her teeth in the am.  Some burning in her mouth with brushing.  On hctz.  States blood pressure is doing well.  No chest pain.  No sob.  No acid reflux.  No abdominal pain.  Taking fiber and eating peaches - helps her bowels.  She does report that she burned herself with hot water.  Spilled on her right arm.  Blistered.  Reports area involved is 1.5x1 inch. Using triple abx cream topically.  Seeing pain clinic.  Pain under better control.      ROS: See pertinent positives and negatives per HPI.  Past Medical History:  Diagnosis Date  . Acute postoperative pain 10/25/2017  . Anemia   . Arm pain 07/26/2015  . Arthritis   . Arthritis, degenerative 03/26/2014  . Back pain 11/01/2013   . Bladder infection 06/2018  . Breast cancer ([place])    Masectomy - left - 1986   . Breast cancer [facility])    Mastectomy-right -2014  . CHEST PAIN 04/29/2010   Qualifier: Diagnosis of  By: Wynetta Emery RN, Doroteo Bradford    . Chronic cystitis   . Cystocele 02/22/2013  . Cystocele, midline 08/19/2013  . Degeneration of intervertebral disc of lumbosacral region 03/26/2014  . DYSPNEA 04/29/2010   Qualifier: Diagnosis of  By: Wynetta Emery RN, Doroteo Bradford    . Enthesopathy of hip 03/26/2014  . GERD (gastroesophageal reflux disease)   . Hiatal hernia   . HTN (hypertension)   . Hypokalemia 06/2018  . Hyponatremia 06/2018  . LBP (low back pain) 03/26/2014  . Neck pain 11/01/2013  . Parkinson disease ([place])   . Pneumonia 06/2018  . Sinusitis 02/07/2015  . Skin lesions 07/12/2014  . Urinary incontinence    mixed     Past Surgical History:  Procedure Laterality Date  . ABDOMINAL HYSTERECTOMY    . BREAST BIOPSY  2013  . BREAST ENHANCEMENT SURGERY  1987  . BREAST IMPLANT REMOVAL    . BREAST IMPLANT REMOVAL Right 08/29/2012  . BREAST SURGERY  1986   s/p left mastectomy  . COLONOSCOPY WITH PROPOFOL N/A 09/13/2016   Procedure: COLONOSCOPY WITH PROPOFOL;  Surgeon: Manya Silvas, MD;  Location: [facility] ENDOSCOPY;  Service: Endoscopy;  Laterality: N/A;  . MASTECTOMY  08/2012   right  . TONSILLECTOMY AND ADENOIDECTOMY  79  . VESICOVAGINAL FISTULA  CLOSURE W/ TAH  1983    Family History  Problem Relation Age of Onset  . Diabetes Father   . Stroke Father   . Colon polyps Father   . Stroke Mother   . Parkinson's disease Mother     SOCIAL HX: reviewed.    Current Outpatient Medications:  .  acyclovir ointment (ZOVIRAX) 5 %, Apply 1 application topically 4 (four) times daily., Disp: 15 g, Rfl: 0 .  amLODipine (NORVASC) 2.5 MG tablet, TAKE 1 TABLET BY MOUTH ONCE DAILY., Disp: 90 tablet, Rfl: 1 .  aspirin 81 MG EC tablet, Take 81 mg by mouth daily as needed.  , Disp: , Rfl:  .  Calcium Carbonate (CALCIUM 600) 1500 MG TABS, Take  1 tablet by mouth daily.  , Disp: , Rfl:  .  citalopram (CELEXA) 20 MG tablet, TAKE 1 & 1/2 TABLETS BY MOUTH ONCE DAILY, Disp: , Rfl:  .  [START ON 07/06/2019] cyclobenzaprine (FLEXERIL) 10 MG tablet, Take 1 tablet (10 mg total) by mouth at bedtime., Disp: 55 tablet, Rfl: 0 .  gabapentin (NEURONTIN) 100 MG capsule, Take 200 mg by mouth 3 (three) times daily. , Disp: , Rfl: 1 .  GLUCOSAMINE SULFATE PO, Take by mouth daily. , Disp: , Rfl:  .  hydrochlorothiazide (HYDRODIURIL) 25 MG tablet, TAKE 1 TABLET BY MOUTH ONCE DAILY. (PATIENT TAKES 1/2 ONCE DAILY AND EXTRA 1/2 IF NEEDED), Disp: 15 tablet, Rfl: 10 .  HYDROcodone-acetaminophen (NORCO/VICODIN) 5-325 MG tablet, Take 1 tablet by mouth every 8 (eight) hours as needed for up to 30 days for severe pain., Disp: 90 tablet, Rfl: 0 .  [START ON 07/31/2019] HYDROcodone-acetaminophen (NORCO/VICODIN) 5-325 MG tablet, Take 1 tablet by mouth every 8 (eight) hours as needed for up to 30 days for severe pain. Must last 30 days, Disp: 90 tablet, Rfl: 0 .  letrozole (FEMARA) 2.5 MG tablet, Take 2.5 mg by mouth daily., Disp: , Rfl:  .  losartan (COZAAR) 100 MG tablet, TAKE 1 TABLET BY MOUTH ONCE DAILY., Disp: 90 tablet, Rfl: 1 .  lubiprostone (AMITIZA) 24 MCG capsule, TAKE 1 CAPSULE BY MOUTH ONCE DAILY WITH BREAKFAST., Disp: , Rfl:  .  [START ON 07/06/2019] magnesium oxide (MAG-OX) 400 (241.3 Mg) MG tablet, Take 1 tablet (400 mg total) by mouth daily., Disp: 55 tablet, Rfl: 0 .  [START ON 07/06/2019] meloxicam (MOBIC) 7.5 MG tablet, Take 1 tablet (7.5 mg total) by mouth daily., Disp: 55 tablet, Rfl: 0 .  metoprolol succinate (TOPROL-XL) 25 MG 24 hr tablet, TAKE 1 TABLET BY MOUTH TWICE DAILY, Disp: 60 tablet, Rfl: 10 .  Multiple Vitamin (MULTIVITAMIN) tablet, Take 1 tablet by mouth daily., Disp: , Rfl:  .  nystatin (MYCOSTATIN) 100000 UNIT/ML suspension, Take 5 mLs (500,000 Units total) by mouth 3 (three) times daily. Swish and spit tid, Disp: 60 mL, Rfl: 0 .  Omega-3  1000 MG CAPS, Take by mouth daily. , Disp: , Rfl:  .  omeprazole (PRILOSEC) 20 MG capsule, TAKE 1 CAPSULE BY MOUTH TWICE DAILY FOR REFLUX., Disp: 60 capsule, Rfl: 1 .  pantoprazole (PROTONIX) 40 MG tablet, TAKE 1 TABLET BY MOUTH ONCE DAILY., Disp: 90 tablet, Rfl: 1 .  pravastatin (PRAVACHOL) 20 MG tablet, TAKE 1 TABLET BY MOUTH ONCE DAILY., Disp: 90 tablet, Rfl: 1 .  silver sulfADIAZINE (SILVADENE) 1 % cream, Apply 1 application topically daily., Disp: 50 g, Rfl: 0  EXAM:  GENERAL: alert.  Sounds to be in no acute distress.  Answering questions appropriately.  PSYCH/NEURO: pleasant and cooperative, no obvious depression or anxiety, speech and thought processing grossly intact  ASSESSMENT AND PLAN:  Discussed the following assessment and plan:  Anemia Colonoscopy 2017 - diverticulosis and internal hemorrhoids.  Follow cbc.   Chronic constipation Taking fever and eating peaches.  Also uses colon cleanse - prn.  Follow.   CKD (chronic kidney disease) stage 3, GFR 30-59 ml/min (HCC) Avoid antiinflammatories.  Follow metabolic panel.   Essential hypertension Blood pressure has ben under good control.  Continue current medication regimen.  Follow pressures.  Follow metabolic panel.    GERD (gastroesophageal reflux disease) Controlled on current regimen.  Follow.    Hypercholesterolemia On pravastatin.  Low cholesterol diet and exercise.  Follow lipid panel and liver function tests.    Parkinsons ([place]) Followed by neurology.  Stable.    Primary cancer of upper inner quadrant of right female breast [facility]) S/p bilateral mastectomy.  On letrozole.  Followed by oncology.  Planning bone density 06/2019.   Mild depression (HCC) On citalopram.  Stable.      I discussed the assessment and treatment plan with the patient. The patient was provided an opportunity to ask questions and all were answered. The patient agreed with the plan and demonstrated an understanding of the instructions.    The patient was advised to call back or seek an in-person evaluation if the symptoms worsen or if the condition fails to improve as anticipated.  I provided 23 minutes of non-face-to-face time during this encounter.   Einar Pheasant, MD

## 2019-06-26 NOTE — Telephone Encounter (Signed)
Called pt.

## 2019-06-29 ENCOUNTER — Encounter: Payer: Self-pay | Admitting: Internal Medicine

## 2019-06-29 NOTE — Assessment & Plan Note (Signed)
S/p bilateral mastectomy.  On letrozole.  Followed by oncology.  Planning bone density 06/2019.

## 2019-06-29 NOTE — Assessment & Plan Note (Signed)
Followed by neurology.  Stable  

## 2019-06-29 NOTE — Assessment & Plan Note (Signed)
Controlled on current regimen.  Follow.  

## 2019-06-29 NOTE — Assessment & Plan Note (Signed)
Avoid antiinflammatories.  Follow metabolic panel.  

## 2019-06-29 NOTE — Assessment & Plan Note (Signed)
Blood pressure has ben under good control.  Continue current medication regimen.  Follow pressures.  Follow metabolic panel.

## 2019-06-29 NOTE — Assessment & Plan Note (Signed)
Colonoscopy 2017 - diverticulosis and internal hemorrhoids.  Follow cbc.

## 2019-06-29 NOTE — Assessment & Plan Note (Signed)
On pravastatin.  Low cholesterol diet and exercise.  Follow lipid panel and liver function tests.   

## 2019-06-29 NOTE — Assessment & Plan Note (Signed)
Taking fever and eating peaches.  Also uses colon cleanse - prn.  Follow.

## 2019-06-29 NOTE — Assessment & Plan Note (Signed)
On citalopram.  Stable.   

## 2019-07-01 ENCOUNTER — Ambulatory Visit
Admission: RE | Admit: 2019-07-01 | Discharge: 2019-07-01 | Disposition: A | Discharge status: Home / Self Care | Payer: Medicare Other | Source: Ambulatory Visit | Attending: Oncology | Admitting: Oncology

## 2019-07-01 ENCOUNTER — Other Ambulatory Visit: Payer: Self-pay

## 2019-07-01 DIAGNOSIS — C50211 Malignant neoplasm of upper-inner quadrant of right female breast: Secondary | ICD-10-CM | POA: Diagnosis not present

## 2019-07-01 DIAGNOSIS — M810 Age-related osteoporosis without current pathological fracture: Secondary | ICD-10-CM | POA: Diagnosis not present

## 2019-07-03 ENCOUNTER — Encounter: Payer: Self-pay | Admitting: Pain Medicine

## 2019-07-06 NOTE — Progress Notes (Signed)
Pain Management Virtual Encounter Note - Virtual Visit via Telephone Telehealth (real-time audio visits between healthcare provider and patient).   Patient's Phone No. & Preferred Pharmacy:  [phone] (home); [phone] (mobile); (Preferred) [phone] No e-mail address on record  [place], [place] - [address] [place] [address] Phone: [phone] Fax: [phone]    Pre-screening note:  Our staff contacted Ms. Hickox and offered her an "in person", "face-to-face" appointment versus a telephone encounter. She indicated preferring the telephone encounter, at this time.   Reason for Virtual Visit: COVID-19*  Social distancing based on CDC and AMA recommendations.   I contacted [name] on 07/07/2019 via telephone.      I clearly identified myself as Gaspar Cola, MD. I verified that I was speaking with the correct person using two identifiers (Name: Haylea Schlichting, and date of birth: 1935/06/01).  Advanced Informed Consent I sought verbal advanced consent from [name] for virtual visit interactions. I informed Ms. Dauria of possible security and privacy concerns, risks, and limitations associated with providing "not-in-person" medical evaluation and management services. I also informed Ms. Newson of the availability of "in-person" appointments. Finally, I informed her that there would be a charge for the virtual visit and that she could be  personally, fully or partially, financially responsible for it. Ms. Greb expressed understanding and agreed to proceed.   Historic Elements   Ms. Brandilynn Kathee Tumlin is a 84 y.o. year old, female patient evaluated today after her last encounter by our practice on 06/06/2019. Ms. Nayak  has a past medical history of Acute postoperative pain (10/25/2017), Anemia, Arm pain (07/26/2015), Arthritis, Arthritis, degenerative (03/26/2014), Back pain (11/01/2013), Bladder  infection (06/2018), Breast cancer ([place]), Breast cancer ([place]), CHEST PAIN (04/29/2010), Chronic cystitis, Cystocele (02/22/2013), Cystocele, midline (08/19/2013), Degeneration of intervertebral disc of lumbosacral region (03/26/2014), DYSPNEA (04/29/2010), Enthesopathy of hip (03/26/2014), GERD (gastroesophageal reflux disease), Hiatal hernia, HTN (hypertension), Hypokalemia (06/2018), Hyponatremia (06/2018), LBP (low back pain) (03/26/2014), Neck pain (11/01/2013), Parkinson disease ([place]), Pneumonia (06/2018), Sinusitis (02/07/2015), Skin lesions (07/12/2014), and Urinary incontinence. She also  has a past surgical history that includes Breast biopsy (2013); Tonsillectomy and adenoidectomy (79); Vesicovaginal fistula closure w/ TAH (1983); Breast surgery (1986); Breast enhancement surgery (1987); Breast implant removal; Breast implant removal (Right, 08/29/2012); Mastectomy (08/2012); Abdominal hysterectomy; and Colonoscopy with propofol (N/A, 09/13/2016). Ms. Polizzi has a current medication list which includes the following prescription(s): acyclovir ointment, amlodipine, aspirin, calcium carbonate, citalopram, cyclobenzaprine, gabapentin, glucosamine sulfate, hydrochlorothiazide, hydrocodone-acetaminophen, hydrocodone-acetaminophen, letrozole, losartan, lubiprostone, magnesium oxide, meloxicam, metoprolol succinate, multivitamin, nystatin, omega-3, omeprazole, pantoprazole, pravastatin, and silver sulfadiazine. She  reports that she has never smoked. She has never used smokeless tobacco. She reports that she does not drink alcohol or use drugs. Ms. Flegal is allergic to sulfa antibiotics and vesicare [solifenacin].   HPI  Today, she is being contacted for a post-procedure assessment.  Patient instructed to follow-up with primary care physician for her decreased kidney function.  Post-Procedure Evaluation  Procedure: Therapeutic/palliative right-sided S-I RFA #3 under fluoroscopic guidance and IV sedation Pre-procedure pain  level:  6/10 Post-procedure: 0/10 (100% relief)  Sedation: Sedation provided.  Effectiveness during initial hour after procedure(Ultra-Short Term Relief): 100 %   Local anesthetic used: Long-acting (4-6 hours) Effectiveness: Defined as any analgesic benefit obtained secondary to the administration of local anesthetics. This carries significant diagnostic value as to the etiological location, or anatomical origin, of the pain. Duration of benefit is expected to  coincide with the duration of the local anesthetic used.  Effectiveness during initial 4-6 hours after procedure(Short-Term Relief): 100 %   Long-term benefit: Defined as any relief past the pharmacologic duration of the local anesthetics.  Effectiveness past the initial 6 hours after procedure(Long-Term Relief): 85 %   Current benefits: Defined as benefit that persist at this time.   Analgesia:  >50% relief Function: Ms. Hosking reports improvement in function ROM: Ms. Zendejas reports improvement in ROM  Pharmacotherapy Assessment  Analgesic: Hydrocodone/APAP 5/325, 1 tab PO q 8 hrs (15 mg/day of hydrocodone) (has enough to last until 08/30/2019) MME/day:15mg /day.   Monitoring: Pharmacotherapy: No side-effects or adverse reactions reported. [place] PMP: PDMP reviewed during this encounter.       Compliance: No problems identified. Effectiveness: Clinically acceptable. Plan: Refer to "POC".  Pertinent Labs   SAFETY SCREENING Profile Lab Results  Component Value Date   SARSCOV2NAA NOT DETECTED 06/02/2019   COVIDSOURCE NASOPHARYNGEAL 06/02/2019   Renal Function Lab Results  Component Value Date   BUN 14 01/08/2019   CREATININE 1.16 (H) 01/08/2019   BCR 12 01/08/2019   GFRAA 51 (L) 01/08/2019   GFRNONAA 44 (L) 01/08/2019   Hepatic Function Lab Results  Component Value Date   AST 28 01/08/2019   ALT 25 07/09/2018   ALBUMIN 4.5 01/08/2019   UDS Summary  Date Value Ref Range Status  01/08/2019 FINAL  Final     Comment:    ==================================================================== TOXASSURE SELECT 13 (MW) ==================================================================== Test                             Result       Flag       Units Drug Present and Declared for Prescription Verification   Hydrocodone                    1944         EXPECTED   ng/mg creat   Dihydrocodeine                 194          EXPECTED   ng/mg creat   Norhydrocodone                 2532         EXPECTED   ng/mg creat    Sources of hydrocodone include scheduled prescription    medications. Dihydrocodeine and norhydrocodone are expected    metabolites of hydrocodone. Dihydrocodeine is also available as a    scheduled prescription medication. ==================================================================== Test                      Result    Flag   Units      Ref Range   Creatinine              188              mg/dL      >=20 ==================================================================== Declared Medications:  The flagging and interpretation on this report are based on the  following declared medications.  Unexpected results may arise from  inaccuracies in the declared medications.  **Note: The testing scope of this panel includes these medications:  Hydrocodone (Hydrocodone-Acetaminophen)  **Note: The testing scope of this panel does not include following  reported medications:  Acetaminophen (Hydrocodone-Acetaminophen)  Acyclovir  Amlodipine Besylate  Aspirin  Calcium carbonate  Citalopram  Cyclobenzaprine  Gabapentin  Glucosamine  Hydrochlorothiazide  Letrozole  Losartan (Losartan Potassium)  Lubiprostone  Magnesium Oxide  Meloxicam  Metoprolol  Multivitamin  Omega-3 Fatty Acids  Omeprazole  Pantoprazole  Pravastatin ==================================================================== For clinical consultation, please call [phone]. ====================================================================    Note: Above Lab results reviewed.  Recent imaging  DG Bone Density EXAM: DUAL X-RAY ABSORPTIOMETRY (DXA) FOR BONE MINERAL DENSITY  IMPRESSION: Technologist: SCE PATIENT BIOGRAPHICAL: Name: Sheilyn, Boehlke Patient ID: [id number] Birth Date: [birth year] Height: 63.0 in. Gender: Female Exam Date: 07/01/2019 Weight: 187.8 lbs.  Indications: Advanced Age, Breast CA, Caucasian, Height Loss, High Risk Meds, History of Breast Cancer, Hysterectomy, Osteoarthritis, Postmenopausal, Scoliosis Fractures: Treatments: Calcium, Gabapentin, letrozole, Multi-Vitamin, Omeprazole, Protonix, Vitamin D  ASSESSMENT:  The BMD measured at Forearm Radius 33% is 0.615 g/cm2 with a T-score of -3.0. This patient is considered osteoporotic according to [facility]) criteria.  The quality of the scan is good. Lumbar spine was not utilized due to advanced degenerative changes/scoliosis.  Site Region Measured Measured WHO Young Adult BMD Date       Age      Classification T-score  DualFemur Neck Left 07/01/2019 81.7 Osteoporosis -2.8 0.653 g/cm2 DualFemur Neck Left 07/10/2018 80.7 Osteopenia -2.4 0.699 g/cm2 DualFemur Neck Left 06/25/2017 79.6 Osteopenia -2.3 0.718 g/cm2 DualFemur Neck Left 06/21/2016 78.6 Osteopenia -2.3 0.712 g/cm2 DualFemur Neck Left 06/15/2015 77.6 Osteopenia -1.8 0.788 g/cm2 DualFemur Neck Left 08/21/2013 75.8 Osteopenia -2.0 0.764 g/cm2  DualFemur Total Mean 07/01/2019 81.7 Osteoporosis -2.5 0.688 g/cm2 DualFemur Total Mean 07/10/2018 80.7 Osteopenia -2.1 0.743 g/cm2 DualFemur Total Mean 06/25/2017 79.6 Osteopenia -2.2 0.734 g/cm2 DualFemur Total Mean 06/21/2016 78.6 Osteopenia -2.1 0.742 g/cm2 DualFemur Total Mean 06/15/2015 77.6 Osteopenia -2.0 0.756 g/cm2 DualFemur Total Mean 08/21/2013 75.8 Osteopenia -1.7 0.789 g/cm2  Left Forearm Radius 33% 07/01/2019 81.7 Osteoporosis -3.0  0.615 g/cm2 Left Forearm Radius 33% 07/10/2018 80.7 Osteoporosis -2.6 0.647 g/cm2 Left Forearm Radius 33% 06/25/2017 79.6 Osteopenia -2.1 0.695 g/cm2 Left Forearm Radius 33% 06/21/2016 78.6 Osteopenia -1.9 0.712 g/cm2 Left Forearm Radius 33% 06/15/2015 77.6 Osteoporosis -2.7 0.638 g/cm2 Left Forearm Radius 33% 08/21/2013 75.8 Osteopenia -1.7 0.724 g/cm2  World Health Organization [facility]) criteria for post-menopausal, Caucasian Women: Normal:       T-score at or above -1 SD Osteopenia:   T-score between -1 and -2.5 SD Osteoporosis: T-score at or below -2.5 SD  RECOMMENDATIONS: 1. All patients should optimize calcium and vitamin D intake. 2. Consider FDA-approved medical therapies in postmenopausal women and men aged 63 years and older, based on the following: a. A hip or vertebral(clinical or morphometric) fracture b. T-score < -2.5 at the femoral neck or spine after appropriate evaluation to exclude secondary causes c. Low bone mass (T-score between -1.0 and -2.5 at the femoral neck or spine) and a 10-year probability of a hip fracture > 3% or a 10-year probability of a major osteoporosis-related fracture > 20% based on the [place]-adapted WHO algorithm d. Clinician judgment and/or patient preferences may indicate treatment for people with 10-year fracture probabilities above or below these levels  FOLLOW-UP: People with diagnosed cases of osteoporosis or at high risk for fracture should have regular bone mineral density tests. For patients eligible for Medicare, routine testing is allowed once every 2 years. The testing frequency can be increased to one year for patients who have rapidly progressing disease, those who are receiving or discontinuing medical therapy to restore bone mass, or have additional risk factors.  Electronically Signed   By: Myles Rosenthal.D.  On: 07/01/2019 11:40  Assessment  There were no encounter diagnoses.  Plan of Care  I am having Belkis T. Alkema  maintain her aspirin, calcium carbonate, multivitamin, lubiprostone, GLUCOSAMINE SULFATE PO, Omega-3, citalopram, omeprazole, gabapentin, acyclovir ointment, letrozole, HYDROcodone-acetaminophen, pravastatin, losartan, pantoprazole, amLODipine, hydrochlorothiazide, metoprolol succinate, cyclobenzaprine, magnesium oxide, meloxicam, HYDROcodone-acetaminophen, silver sulfADIAZINE, and nystatin.  Pharmacotherapy (Medications Ordered): No orders of the defined types were placed in this encounter.  Orders:  No orders of the defined types were placed in this encounter.  Follow-up plan:   Return in about 7 weeks (around 08/27/2019) for (VV), E/M (MM).      Interventional management options:  Considering: Diagnosticbilateral cervical facet block  Possiblebilateral cervical facet RFA  Diagnostic right-sided CESI  Diagnosticintra-articular hip joint injection #2  Possible hip joint radiofrequency ablation   Palliative PRN treatment(s): Palliative bilateral trochanteric bursa injection #2  Palliative right vs bilateral lumbar facet blocks  Palliative left lumbar facet RFA #2 (last done on 02/12/2018) Palliative right lumbar facet RFA #3(last done on06/18/19) Palliative right S-I blocks  Palliative right S-I RFA #4 (last done on 06/05/2019) Palliative right suprascapular muscle TPI/MNB  Palliativeright  L3-4 LESI#3 Palliative right L5 TFESI #3    Recent Visits Date Type Provider Dept  06/05/19 Procedure visit Milinda Pointer, MD [facility]  Showing recent visits within past 90 days and meeting all other requirements   Today's Visits Date Type Provider Dept  07/07/19 Office Visit Milinda Pointer, MD [facility]  Showing today's visits and meeting all other requirements   Future Appointments Date Type Provider Dept  07/23/19 Appointment Milinda Pointer, MD [facility]  Showing future appointments within next 90 days and meeting all other  requirements   I discussed the assessment and treatment plan with the patient. The patient was provided an opportunity to ask questions and all were answered. The patient agreed with the plan and demonstrated an understanding of the instructions.  Patient advised to call back or seek an in-person evaluation if the symptoms or condition worsens.  Total duration of non-face-to-face encounter: 15 minutes.  Note by: Gaspar Cola, MD Date: 07/07/2019; Time: 12:28 PM  Note: This dictation was prepared with Dragon dictation. Any transcriptional errors that may result from this process are unintentional.  Disclaimer:  * Given the special circumstances of the COVID-19 pandemic, the federal government has announced that the Office for Civil Rights (OCR) will exercise its enforcement discretion and will not impose penalties on physicians using telehealth in the event of noncompliance with regulatory requirements under the [place] (HIPAA) in connection with the good faith provision of telehealth during the ENIDP-82 national public health emergency. ([place])

## 2019-07-07 ENCOUNTER — Other Ambulatory Visit: Payer: Self-pay

## 2019-07-07 ENCOUNTER — Ambulatory Visit: Payer: Medicare Other | Attending: Nurse Practitioner | Admitting: Pain Medicine

## 2019-07-07 DIAGNOSIS — M79604 Pain in right leg: Secondary | ICD-10-CM

## 2019-07-07 DIAGNOSIS — G894 Chronic pain syndrome: Secondary | ICD-10-CM | POA: Diagnosis not present

## 2019-07-07 DIAGNOSIS — M545 Low back pain: Secondary | ICD-10-CM | POA: Diagnosis not present

## 2019-07-07 DIAGNOSIS — G8929 Other chronic pain: Secondary | ICD-10-CM

## 2019-07-07 MED ORDER — HYDROCODONE-ACETAMINOPHEN 5-325 MG PO TABS: 1.0000 | ORAL_TABLET | Freq: Three times a day (TID) | ORAL | 0 refills | Status: DC | PRN

## 2019-07-09 ENCOUNTER — Telehealth: Payer: Self-pay | Admitting: Pain Medicine

## 2019-07-09 ENCOUNTER — Other Ambulatory Visit: Payer: Self-pay | Admitting: Internal Medicine

## 2019-07-09 ENCOUNTER — Telehealth: Payer: Self-pay

## 2019-07-09 DIAGNOSIS — M7918 Myalgia, other site: Secondary | ICD-10-CM

## 2019-07-09 NOTE — Telephone Encounter (Signed)
Called and informed patient that meds are at pharm.

## 2019-07-09 NOTE — Telephone Encounter (Signed)
Called Pharmacy because patient had called and stated she was out of her Magnesium and Meloxicam. They stated that they filled it on 06/06/19 instead of the date is was suppose to be filled (7/19). They stated they would need a new order for magnesium and meloxicam.

## 2019-07-09 NOTE — Telephone Encounter (Signed)
Pharmacy called stating they did not get refills sent for meloxicam and magnesium oxide. I see in chart order put in on 07-06-19. Please call pharmacy

## 2019-07-10 ENCOUNTER — Telehealth: Payer: Self-pay

## 2019-07-10 ENCOUNTER — Ambulatory Visit: Payer: Medicare Other | Admitting: Internal Medicine

## 2019-07-10 ENCOUNTER — Ambulatory Visit (INDEPENDENT_AMBULATORY_CARE_PROVIDER_SITE_OTHER): Payer: Medicare Other

## 2019-07-10 DIAGNOSIS — Z0000 Encounter for general adult medical examination without abnormal findings: Secondary | ICD-10-CM

## 2019-07-10 NOTE — Patient Instructions (Addendum)
  Wanda Martinez , Thank you for taking time to come for your Medicare Wellness Visit. I appreciate your ongoing commitment to your health goals. Please review the following plan we discussed and let me know if I can assist you in the future.   These are the goals we discussed: Goals      Patient Stated   . Increase physical activity (pt-stated)     Walk for exercise when the weather is cool Practice more brain stimulating activities (reading, new hobbies, painting etc)       This is a list of the screening recommended for you and due dates:  Health Maintenance  Topic Date Due  . Tetanus Vaccine  10/17/1954  . Mammogram  09/03/2035*  . Flu Shot  07/19/2019  . Pneumonia vaccines (2 of 2 - PCV13) 09/03/2019  . DEXA scan (bone density measurement)  Completed  *Topic was postponed. The date shown is not the original due date.

## 2019-07-10 NOTE — Telephone Encounter (Signed)
Made contact with patient for annual wellness and health maintenance update appointment. Patient requests a call back to complete visit later today at 2:00 pm due to unforseen circumstances. Will follow as appropriate.

## 2019-07-10 NOTE — Progress Notes (Signed)
Subjective:   Wanda Martinez is a 84 y.o. female who presents for Medicare Annual (Subsequent) preventive examination.  Review of Systems:  No ROS.  Medicare Wellness Virtual Visit.  Visual/audio telehealth visit, UTA vital signs.   See social history for additional risk factors.   Cardiac Risk Factors include: advanced age (>91men, >74 women);hypertension     Objective:     Vitals: LMP 12/18/1981   There is no height or weight on file to calculate BMI.  Advanced Directives 07/10/2019 05/21/2019 10/24/2018 10/17/2018 07/17/2018 06/04/2018 04/09/2018  Does Patient Have a Medical Advance Directive? No No No No No No No  Would patient like information on creating a medical advance directive? No - Patient declined No - Patient declined No - Patient declined No - Patient declined No - Patient declined - No - Patient declined    Tobacco Social History   Tobacco Use  Smoking Status Never Smoker  Smokeless Tobacco Never Used  Tobacco Comment   tobacco use - no     Counseling given: Not Answered Comment: tobacco use - no   Clinical Intake:  Pre-visit preparation completed: Yes        Diabetes: No  How often do you need to have someone help you when you read instructions, pamphlets, or other written materials from your doctor or pharmacy?: 1 - Never  Interpreter Needed?: No     Past Medical History:  Diagnosis Date  . Acute postoperative pain 10/25/2017  . Anemia   . Arm pain 07/26/2015  . Arthritis   . Arthritis, degenerative 03/26/2014  . Back pain 11/01/2013  . Bladder infection 06/2018  . Breast cancer ([place])    Masectomy - left - 1986   . Breast cancer [facility])    Mastectomy-right -2014  . CHEST PAIN 04/29/2010   Qualifier: Diagnosis of  By: Wynetta Emery RN, Doroteo Bradford    . Chronic cystitis   . Cystocele 02/22/2013  . Cystocele, midline 08/19/2013  . Degeneration of intervertebral disc of lumbosacral region 03/26/2014  . DYSPNEA 04/29/2010   Qualifier: Diagnosis of  By:  Wynetta Emery RN, Doroteo Bradford    . Enthesopathy of hip 03/26/2014  . GERD (gastroesophageal reflux disease)   . Hiatal hernia   . HTN (hypertension)   . Hypokalemia 06/2018  . Hyponatremia 06/2018  . LBP (low back pain) 03/26/2014  . Neck pain 11/01/2013  . Parkinson disease ([place])   . Pneumonia 06/2018  . Sinusitis 02/07/2015  . Skin lesions 07/12/2014  . Urinary incontinence    mixed    Past Surgical History:  Procedure Laterality Date  . ABDOMINAL HYSTERECTOMY    . BREAST BIOPSY  2013  . BREAST ENHANCEMENT SURGERY  1987  . BREAST IMPLANT REMOVAL    . BREAST IMPLANT REMOVAL Right 08/29/2012  . BREAST SURGERY  1986   s/p left mastectomy  . COLONOSCOPY WITH PROPOFOL N/A 09/13/2016   Procedure: COLONOSCOPY WITH PROPOFOL;  Surgeon: Manya Silvas, MD;  Location: [facility] ENDOSCOPY;  Service: Endoscopy;  Laterality: N/A;  . MASTECTOMY  08/2012   right  . TONSILLECTOMY AND ADENOIDECTOMY  79  . VESICOVAGINAL FISTULA CLOSURE W/ TAH  1983   Family History  Problem Relation Age of Onset  . Diabetes Father   . Stroke Father   . Colon polyps Father   . Stroke Mother   . Parkinson's disease Mother    Social History   Socioeconomic History  . Marital status: Married    Spouse name: Not on file  .  Number of children: 3  . Years of education: Not on file  . Highest education level: Not on file  Occupational History  . Not on file  Social Needs  . Financial resource strain: Not hard at all  . Food insecurity    Worry: Never true    Inability: Never true  . Transportation needs    Medical: No    Non-medical: No  Tobacco Use  . Smoking status: Never Smoker  . Smokeless tobacco: Never Used  . Tobacco comment: tobacco use - no  Substance and Sexual Activity  . Alcohol use: No    Alcohol/week: 0.0 standard drinks  . Drug use: No  . Sexual activity: Never  Lifestyle  . Physical activity    Days per week: Not on file    Minutes per session: Not on file  . Stress: Not at all  Relationships   . Social Herbalist on phone: Not on file    Gets together: Not on file    Attends religious service: Not on file    Active member of club or organization: Not on file    Attends meetings of clubs or organizations: Not on file    Relationship status: Not on file  Other Topics Concern  . Not on file  Social History Narrative  . Not on file    Outpatient Encounter Medications as of 07/10/2019  Medication Sig  . acyclovir ointment (ZOVIRAX) 5 % Apply 1 application topically 4 (four) times daily.  Marland Kitchen amLODipine (NORVASC) 2.5 MG tablet TAKE 1 TABLET BY MOUTH ONCE DAILY.  Marland Kitchen aspirin 81 MG EC tablet Take 81 mg by mouth daily as needed.    . Calcium Carbonate (CALCIUM 600) 1500 MG TABS Take 1 tablet by mouth daily.    . citalopram (CELEXA) 20 MG tablet TAKE 1 & 1/2 TABLETS BY MOUTH ONCE DAILY  . cyclobenzaprine (FLEXERIL) 10 MG tablet Take 1 tablet (10 mg total) by mouth at bedtime.  . gabapentin (NEURONTIN) 100 MG capsule Take 200 mg by mouth 3 (three) times daily.   Marland Kitchen GLUCOSAMINE SULFATE PO Take by mouth daily.   . hydrochlorothiazide (HYDRODIURIL) 25 MG tablet TAKE 1 TABLET BY MOUTH ONCE DAILY. (PATIENT TAKES 1/2 ONCE DAILY AND EXTRA 1/2 IF NEEDED)  . [START ON 07/31/2019] HYDROcodone-acetaminophen (NORCO/VICODIN) 5-325 MG tablet Take 1 tablet by mouth every 8 (eight) hours as needed for up to 30 days for severe pain. Must last 30 days  . HYDROcodone-acetaminophen (NORCO/VICODIN) 5-325 MG tablet Take 1 tablet by mouth every 8 (eight) hours as needed for severe pain.  Marland Kitchen letrozole (FEMARA) 2.5 MG tablet Take 2.5 mg by mouth daily.  Marland Kitchen losartan (COZAAR) 100 MG tablet TAKE 1 TABLET BY MOUTH ONCE DAILY.  Marland Kitchen lubiprostone (AMITIZA) 24 MCG capsule TAKE 1 CAPSULE BY MOUTH ONCE DAILY WITH BREAKFAST.  . magnesium oxide (MAG-OX) 400 (241.3 Mg) MG tablet Take 1 tablet (400 mg total) by mouth daily.  . meloxicam (MOBIC) 7.5 MG tablet Take 1 tablet (7.5 mg total) by mouth daily.  . metoprolol  succinate (TOPROL-XL) 25 MG 24 hr tablet TAKE 1 TABLET BY MOUTH TWICE DAILY  . Multiple Vitamin (MULTIVITAMIN) tablet Take 1 tablet by mouth daily.  Marland Kitchen nystatin (MYCOSTATIN) 100000 UNIT/ML suspension Take 5 mLs (500,000 Units total) by mouth 3 (three) times daily. Swish and spit tid  . Omega-3 1000 MG CAPS Take by mouth daily.   Marland Kitchen omeprazole (PRILOSEC) 20 MG capsule TAKE 1 CAPSULE BY MOUTH  TWICE DAILY FOR REFLUX.  . pantoprazole (PROTONIX) 40 MG tablet TAKE 1 TABLET BY MOUTH ONCE DAILY.  . pravastatin (PRAVACHOL) 20 MG tablet TAKE 1 TABLET BY MOUTH ONCE DAILY.  . silver sulfADIAZINE (SILVADENE) 1 % cream Apply 1 application topically daily.   No facility-administered encounter medications on file as of 07/10/2019.     Activities of Daily Living In your present state of health, do you have any difficulty performing the following activities: 07/10/2019  Hearing? N  Vision? N  Difficulty concentrating or making decisions? Y  Walking or climbing stairs? N  Dressing or bathing? N  Doing errands, shopping? N  Preparing Food and eating ? N  Using the Toilet? N  In the past six months, have you accidently leaked urine? Y  Comment Managed with daily liner  Do you have problems with loss of bowel control? N  Managing your Medications? N  Managing your Finances? Y  Comment Husband manages  Housekeeping or managing your Housekeeping? N  Some recent data might be hidden    Patient Care Team: Einar Pheasant, MD as PCP - General (Internal Medicine)    Assessment:   This is a routine wellness examination for Wanda Martinez.   I connected with patient 07/10/19 at 10:00 AM EDT by a video/audio enabled telemedicine application and verified that I am speaking with the correct person using two identifiers. Patient stated full name and DOB. Patient gave permission to continue with virtual visit. Patient's location was at home and Nurse's location was at [place] office.   Health Screenings  Mammogram -   Colonoscopy -  Bone Density - 06/2019 Glaucoma -none Hearing -demonstrates normal hearing during visit. Hemoglobin A1C - Cholesterol -  Dental- UTD Vision- visits within the last 12 months.  Social  Alcohol intake - no      Smoking history- never    Smokers in home? none Illicit drug use? none Physical activity- gardening Diet - healthy Sexually Active -not currently BMI- discussed the importance of a healthy diet, water intake and the benefits of aerobic exercise.  Educational material provided.   Safety  Patient feels safe at home- yes Patient does have smoke detectors at home- yes Patient does wear sunscreen or protective clothing when in direct sunlight -yes Patient does wear seat belt when in a moving vehicle -yes Patient drives- yes  [id number] precautions and sickness symptoms discussed.   Activities of Daily Living Patient denies needing assistance with: driving, household chores, feeding themselves, getting from bed to chair, getting to the toilet, bathing/showering, dressing, managing money, or preparing meals.  No new identified risk were noted.    Depression Screen Patient denies losing interest in daily life, feeling hopeless, or crying easily over simple problems.   Medication-taking as directed and without issues.   Fall Screen Patient denies being afraid of falling or falling in the last year.   Memory Screen Patient is alert.  Patient denies difficulty focusing, concentrating or misplacing items. Correctly identified the president of the [place], season and recall. Patient likes to read, plays computer games, and/or work puzzles for brain stimulation.  Immunizations The following Immunizations were discussed: Influenza, shingles, pneumonia, and tetanus.   Other Providers Patient Care Team: Einar Pheasant, MD as PCP - General (Internal Medicine)  Exercise Activities and Dietary recommendations    Goals      Patient Stated   . Increase physical  activity (pt-stated)     Walk for exercise when the weather is cool Practice more brain stimulating  activities (reading, new hobbies, painting etc)       Fall Risk Fall Risk  07/10/2019 06/05/2019 02/13/2019 01/08/2019 01/08/2019  Falls in the past year? 1 0 0 0 0  Number falls in past yr: 0 - - - -  Injury with Fall? 0 - - - -  Comment She slipped on fluid in the home - - - -  Risk Factor Category  - - - - -  Risk for fall due to : - - - - -  Risk for fall due to: Comment - - - - -  Follow up Falls prevention discussed;Education provided - - - -   Is the patient's home free of loose throw rugs in walkways, pet beds, electrical cords, etc? yes      Grab bars in the bathroom? yes      Handrails on the stairs? yes      Adequate lighting?  yes  Depression Screen PHQ 2/9 Scores 07/10/2019 06/26/2019 02/13/2019 01/08/2019  PHQ - 2 Score 0 0 0 0  PHQ- 9 Score - 0 - -  Exception Documentation - - - -     Cognitive Function MMSE - Mini Mental State Exam 07/10/2019 10/26/2016  Not completed: Refused -  Orientation to time - 5  Orientation to Place - 5  Registration - 1  Attention/ Calculation - 5  Recall - 3  Language- name 2 objects - 2  Language- repeat - 1  Language- follow 3 step command - 3  Language- read & follow direction - 1  Write a sentence - 1  Copy design - 1  Total score - 28     6CIT Screen 10/26/2016  What Year? 0 points  What month? 0 points  What time? 0 points  Count back from 20 0 points  Months in reverse 0 points    Immunization History  Administered Date(s) Administered  . Influenza Split 11/02/2014  . Influenza, High Dose Seasonal PF 10/02/2016, 08/23/2018  . Influenza,inj,Quad PF,6+ Mos 09/03/2015  . Influenza-Unspecified 09/22/2013, 11/06/2014, 09/03/2015, 10/02/2016  . Pneumococcal Polysaccharide-23 09/02/2018   Screening Tests Health Maintenance  Topic Date Due  . TETANUS/TDAP  10/17/1954  . MAMMOGRAM  09/03/2035 (Originally 07/11/2013)  .  INFLUENZA VACCINE  07/19/2019  . PNA vac Low Risk Adult (2 of 2 - PCV13) 09/03/2019  . DEXA SCAN  Completed      Plan:   End of life planning; Advanced aging; Advanced directives discussed.  No HCPOA/Living Will.  Additional information declined at this time.  I have personally reviewed and noted the following in the patient's chart:   . Medical and social history . Use of alcohol, tobacco or illicit drugs  . Current medications and supplements . Functional ability and status . Nutritional status . Physical activity . Advanced directives . List of other physicians . Hospitalizations, surgeries, and ER visits in previous 12 months . Vitals . Screenings to include cognitive, depression, and falls . Referrals and appointments  In addition, I have reviewed and discussed with patient certain preventive protocols, quality metrics, and best practice recommendations. A written personalized care plan for preventive services as well as general preventive health recommendations were provided to patient.     Varney Biles, LPN  [id number]   Reviewed above information.  Agree with assessment and plan.    Dr Nicki Reaper

## 2019-07-15 ENCOUNTER — Other Ambulatory Visit: Payer: Self-pay | Admitting: Pain Medicine

## 2019-07-15 ENCOUNTER — Telehealth: Payer: Self-pay | Admitting: *Deleted

## 2019-07-15 NOTE — Telephone Encounter (Signed)
Adam from [facility] called and stated that they prepare patients medications for the month and he does not have enough to prepare her bubble pack for the next month.  States she will be completely out of medicine on July 30.  We will contact Dr Dossie Arbour to determine what needs to be done and call Adam back at [facility].

## 2019-07-16 ENCOUNTER — Ambulatory Visit: Payer: Medicare Other | Admitting: Pain Medicine

## 2019-07-17 ENCOUNTER — Other Ambulatory Visit: Payer: Self-pay

## 2019-07-17 ENCOUNTER — Other Ambulatory Visit (INDEPENDENT_AMBULATORY_CARE_PROVIDER_SITE_OTHER): Payer: Medicare Other

## 2019-07-17 DIAGNOSIS — E7800 Pure hypercholesterolemia, unspecified: Secondary | ICD-10-CM

## 2019-07-17 DIAGNOSIS — N183 Chronic kidney disease, stage 3 unspecified: Secondary | ICD-10-CM

## 2019-07-17 DIAGNOSIS — I10 Essential (primary) hypertension: Secondary | ICD-10-CM

## 2019-07-17 LAB — BASIC METABOLIC PANEL
BUN: 22 mg/dL (ref 6–23)
CO2: 28 mEq/L (ref 19–32)
Calcium: 9.3 mg/dL (ref 8.4–10.5)
Chloride: 100 mEq/L (ref 96–112)
Creatinine, Ser: 1.15 mg/dL (ref 0.40–1.20)
GFR: 45.2 mL/min — ABNORMAL LOW (ref >60.00)
Glucose, Bld: 81 mg/dL (ref 70–99)
Potassium: 4.3 mEq/L (ref 3.5–5.1)
Sodium: 135 mEq/L (ref 135–145)

## 2019-07-17 LAB — CBC WITH DIFFERENTIAL/PLATELET
Basophils Absolute: 0.1 10*3/uL (ref 0.0–0.1)
Basophils Relative: 1.3 % (ref 0.0–3.0)
Eosinophils Absolute: 0.3 10*3/uL (ref 0.0–0.7)
Eosinophils Relative: 7.4 % — ABNORMAL HIGH (ref 0.0–5.0)
HCT: 32.9 % — ABNORMAL LOW (ref 36.0–46.0)
Hemoglobin: 11.1 g/dL — ABNORMAL LOW (ref 12.0–15.0)
Lymphocytes Relative: 29.3 % (ref 12.0–46.0)
Lymphs Abs: 1.4 10*3/uL (ref 0.7–4.0)
MCHC: 33.7 g/dL (ref 30.0–36.0)
MCV: 89.8 fl (ref 78.0–100.0)
Monocytes Absolute: 0.4 10*3/uL (ref 0.1–1.0)
Monocytes Relative: 9.1 % (ref 3.0–12.0)
Neutro Abs: 2.5 10*3/uL (ref 1.4–7.7)
Neutrophils Relative %: 52.9 % (ref 43.0–77.0)
Platelets: 223 10*3/uL (ref 150.0–400.0)
RBC: 3.66 Mil/uL — ABNORMAL LOW (ref 3.87–5.11)
RDW: 13.4 % (ref 11.5–15.5)
WBC: 4.7 10*3/uL (ref 4.0–10.5)

## 2019-07-17 LAB — LIPID PANEL
Cholesterol: 143 mg/dL (ref 0–200)
HDL: 38.6 mg/dL — ABNORMAL LOW (ref >39.00)
LDL Cholesterol: 71 mg/dL (ref 0–99)
NonHDL: 104.59
Total CHOL/HDL Ratio: 4
Triglycerides: 170 mg/dL — ABNORMAL HIGH (ref 0.0–149.0)
VLDL: 34 mg/dL (ref 0.0–40.0)

## 2019-07-17 LAB — HEPATIC FUNCTION PANEL
ALT: 15 U/L (ref 0–35)
AST: 21 U/L (ref 0–37)
Albumin: 4.2 g/dL (ref 3.5–5.2)
Alkaline Phosphatase: 63 U/L (ref 39–117)
Bilirubin, Direct: 0 mg/dL (ref 0.0–0.3)
Total Bilirubin: 0.3 mg/dL (ref 0.2–1.2)
Total Protein: 7.2 g/dL (ref 6.0–8.3)

## 2019-07-17 LAB — TSH: TSH: 5 u[IU]/mL — ABNORMAL HIGH (ref 0.35–4.50)

## 2019-07-18 ENCOUNTER — Other Ambulatory Visit: Payer: Self-pay | Admitting: Internal Medicine

## 2019-07-18 DIAGNOSIS — D649 Anemia, unspecified: Secondary | ICD-10-CM

## 2019-07-18 DIAGNOSIS — R7989 Other specified abnormal findings of blood chemistry: Secondary | ICD-10-CM

## 2019-07-18 DIAGNOSIS — R944 Abnormal results of kidney function studies: Secondary | ICD-10-CM

## 2019-07-18 NOTE — Progress Notes (Signed)
Order placed for f/u labs.  

## 2019-07-20 NOTE — Progress Notes (Signed)
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  INTERVAL HISTORY: Patient returns to clinic today for further evaluation and initiation of Prolia.  Although her Parkinson's symptoms continue to be well controlled with her current medications, her memory seems to be slightly worse with mild confusion.  She has no new neurologic complaints.  She denies any recent fevers or illnesses.  She has a good appetite and denies weight loss.  She denies any chest pain, shortness of breath, cough, or hemoptysis.  She denies any nausea, vomiting, constipation, or diarrhea.  She has no urinary complaints.  Patient offers no specific complaints today.  REVIEW OF SYSTEMS:   Review of Systems  Constitutional: Negative.  Negative for fever, malaise/fatigue and weight loss.  Respiratory: Negative.  Negative for cough and shortness of breath.   Cardiovascular: Negative.  Negative for chest pain and leg swelling.  Gastrointestinal: Negative.  Negative for abdominal pain.  Genitourinary: Negative.   Musculoskeletal: Negative.  Negative for back pain and joint pain.  Skin: Negative.  Negative for rash.  Neurological: Positive for tremors. Negative for sensory change, speech change, focal weakness and weakness.  Psychiatric/Behavioral: Negative.  Negative for depression. The patient is not nervous/anxious.     As per HPI. Otherwise, a complete review of systems is negative.  PAST MEDICAL HISTORY: Past Medical History:  Diagnosis Date   Acute postoperative pain 10/25/2017   Anemia    Arm pain 07/26/2015   Arthritis    Arthritis, degenerative 03/26/2014   Back pain 11/01/2013   Bladder  infection 06/2018   Breast cancer ([place])    Masectomy - left - 1986    Breast cancer [facility])    Mastectomy-right -2014   CHEST PAIN 04/29/2010   Qualifier: Diagnosis of  By: Wynetta Emery RN, Erika     Chronic cystitis    Cystocele 02/22/2013   Cystocele, midline 08/19/2013   Degeneration of intervertebral disc of lumbosacral region 03/26/2014   DYSPNEA 04/29/2010   Qualifier: Diagnosis of  By: Wynetta Emery RN, Erika     Enthesopathy of hip 03/26/2014   GERD (gastroesophageal reflux disease)    Hiatal hernia    HTN (hypertension)    Hypokalemia 06/2018   Hyponatremia 06/2018   LBP (low back pain) 03/26/2014   Neck pain 11/01/2013   Parkinson disease ([place])    Pneumonia 06/2018   Sinusitis 02/07/2015   Skin lesions 07/12/2014   Urinary incontinence    mixed     PAST SURGICAL HISTORY: Past Surgical History:  Procedure Laterality Date   ABDOMINAL HYSTERECTOMY     BREAST BIOPSY  2013   BREAST ENHANCEMENT SURGERY  1987   BREAST IMPLANT REMOVAL     BREAST IMPLANT REMOVAL Right 08/29/2012   BREAST SURGERY  1986   s/p left mastectomy   COLONOSCOPY WITH PROPOFOL N/A 09/13/2016   Procedure: COLONOSCOPY WITH PROPOFOL;  Surgeon: Manya Silvas, MD;  [facility] END[facility]COPY;  Service: Endoscopy;  Laterality: N/A;   MASTECTOMY  08/2012   right   TONSILLECTOMY AND ADENOIDECTOMY  15   VESICOVAGINAL FISTULA CLOSURE W/ TAH  1983    FAMILY HISTORY Family History  Problem Relation Age of Onset   Diabetes Father    Stroke Father    Colon  polyps Father    Stroke Mother    Parkinson's disease Mother        ADVANCED DIRECTIVES:    HEALTH MAINTENANCE: Social History   Tobacco Use   Smoking status: Never Smoker   Smokeless tobacco: Never Used   Tobacco comment: tobacco use - no  Substance Use Topics   Alcohol use: No    Alcohol/week: 0.0 standard drinks   Drug use: No     Allergies  Allergen Reactions   Sulfa Antibiotics Nausea Only   Vesicare  [Solifenacin] Other (See Comments)    Constipation Constipation    Current Outpatient Medications  Medication Sig Dispense Refill   amLODipine (NORVASC) 2.5 MG tablet TAKE 1 TABLET BY MOUTH ONCE DAILY. 90 tablet 1   aspirin 81 MG EC tablet Take 81 mg by mouth daily as needed.       Calcium Carbonate (CALCIUM 600) 1500 MG TABS Take 1 tablet by mouth daily.       citalopram (CELEXA) 20 MG tablet TAKE 1 & 1/2 TABLETS BY MOUTH ONCE DAILY     cyclobenzaprine (FLEXERIL) 10 MG tablet TAKE ONE TABLET BY MOUTH AT BEDTIME. 30 tablet 0   gabapentin (NEURONTIN) 100 MG capsule Take 200 mg by mouth 3 (three) times daily.   1   GLUCOSAMINE SULFATE PO Take by mouth daily.      hydrochlorothiazide (HYDRODIURIL) 25 MG tablet TAKE 1 TABLET BY MOUTH ONCE DAILY. (PATIENT TAKES 1/2 ONCE DAILY AND EXTRA 1/2 IF NEEDED) 15 tablet 10   [START ON 07/31/2019] HYDROcodone-acetaminophen (NORCO/VICODIN) 5-325 MG tablet Take 1 tablet by mouth every 8 (eight) hours as needed for up to 30 days for severe pain. Must last 30 days 90 tablet 0   HYDROcodone-acetaminophen (NORCO/VICODIN) 5-325 MG tablet Take 1 tablet by mouth every 8 (eight) hours as needed for severe pain. 90 tablet 0   letrozole (FEMARA) 2.5 MG tablet Take 2.5 mg by mouth daily.     losartan (COZAAR) 100 MG tablet TAKE 1 TABLET BY MOUTH ONCE DAILY. 90 tablet 1   lubiprostone (AMITIZA) 24 MCG capsule TAKE 1 CAPSULE BY MOUTH ONCE DAILY WITH BREAKFAST.     magnesium oxide (MAG-OX) 400 (241.3 Mg) MG tablet Take 1 tablet (400 mg total) by mouth daily. 55 tablet 0   meloxicam (MOBIC) 7.5 MG tablet Take 1 tablet (7.5 mg total) by mouth daily. 55 tablet 0   metoprolol succinate (TOPROL-XL) 25 MG 24 hr tablet TAKE 1 TABLET BY MOUTH TWICE DAILY 60 tablet 10   Multiple Vitamin (MULTIVITAMIN) tablet Take 1 tablet by mouth daily.     nystatin (MYCOSTATIN) 100000 UNIT/ML suspension Take 5 mLs (500,000 Units total) by mouth 3 (three) times daily. Swish and  spit tid 60 mL 0   Omega-3 1000 MG CAPS Take by mouth daily.      omeprazole (PRILOSEC) 20 MG capsule TAKE 1 CAPSULE BY MOUTH TWICE DAILY FOR REFLUX. 60 capsule 1   pantoprazole (PROTONIX) 40 MG tablet TAKE 1 TABLET BY MOUTH ONCE DAILY. 90 tablet 1   pravastatin (PRAVACHOL) 20 MG tablet TAKE 1 TABLET BY MOUTH ONCE DAILY. 90 tablet 1   silver sulfADIAZINE (SILVADENE) 1 % cream Apply 1 application topically daily. (Patient not taking: Reported on 07/24/2019) 50 g 0   No current facility-administered medications for this visit.     OBJECTIVE: Vitals:   07/24/19 1104  BP: 129/73  Pulse: (!) 58  Resp: 20     Body mass index is 30.9 kg/m.  ECOG FS:0 - Asymptomatic  General: Well-developed, well-nourished, no acute distress. Eyes: Pink conjunctiva, anicteric sclera. HEENT: Normocephalic, moist mucous membranes. Breast: Exam deferred today. Lungs: Clear to auscultation bilaterally. Heart: Regular rate and rhythm. No rubs, murmurs, or gallops. Abdomen: Soft, nontender, nondistended. No organomegaly noted, normoactive bowel sounds. Musculoskeletal: No edema, cyanosis, or clubbing. Neuro: Alert, answering all questions appropriately. Cranial nerves grossly intact. Skin: No rashes or petechiae noted. Psych: Normal affect.   LAB RESULTS:  Lab Results  Component Value Date   NA 134 (L) 07/24/2019   K 4.9 07/24/2019   CL 97 (L) 07/24/2019   CO2 28 07/24/2019   GLUCOSE 62 (L) 07/24/2019   BUN 26 (H) 07/24/2019   CREATININE 1.51 (H) 07/24/2019   CALCIUM 9.5 07/24/2019   PROT 7.2 07/17/2019   ALBUMIN 4.2 07/17/2019   AST 21 07/17/2019   ALT 15 07/17/2019   ALKPHOS 63 07/17/2019   BILITOT 0.3 07/17/2019   GFRNONAA 32 (L) 07/24/2019   GFRAA 37 (L) 07/24/2019    Lab Results  Component Value Date   WBC 4.7 07/17/2019   NEUTROABS 2.5 07/17/2019   HGB 11.1 (L) 07/17/2019   HCT 32.9 (L) 07/17/2019   MCV 89.8 07/17/2019   PLT 223.0 07/17/2019     STUDIES: Dg Bone  Density  Result Date: 07/01/2019 EXAM: DUAL X-RAY ABSORPTIOMETRY (DXA) FOR BONE MINERAL DENSITY IMPRESSION: Technologist: SCE PATIENT BIOGRAPHICAL: Name: Ziare, Orrick Patient ID: [id number] Birth Date: 1935/04/04 Height: 63.0 in. Gender: Female Exam Date: 07/01/2019 Weight: 187.8 lbs. Indications: Advanced Age, Breast CA, Caucasian, Height Loss, High Risk Meds, History of Breast Cancer, Hysterectomy, Osteoarthritis, Postmenopausal, Scoliosis Fractures: Treatments: Calcium, Gabapentin, letrozole, Multi-Vitamin, Omeprazole, Protonix, Vitamin D ASSESSMENT: The BMD measured at Forearm Radius 33% is 0.615 g/cm2 with a T-score of -3.0. This patient is considered osteoporotic according to [facility]) criteria. The quality of the scan is good. Lumbar spine was not utilized due to advanced degenerative changes/scoliosis. Site Region Measured Measured WHO Young Adult BMD Date       Age      Classification T-score DualFemur Neck Left 07/01/2019 81.7 Osteoporosis -2.8 0.653 g/cm2 DualFemur Neck Left 07/10/2018 80.7 Osteopenia -2.4 0.699 g/cm2 DualFemur Neck Left 06/25/2017 79.6 Osteopenia -2.3 0.718 g/cm2 DualFemur Neck Left 06/21/2016 78.6 Osteopenia -2.3 0.712 g/cm2 DualFemur Neck Left 06/15/2015 77.6 Osteopenia -1.8 0.788 g/cm2 DualFemur Neck Left 08/21/2013 75.8 Osteopenia -2.0 0.764 g/cm2 DualFemur Total Mean 07/01/2019 81.7 Osteoporosis -2.5 0.688 g/cm2 DualFemur Total Mean 07/10/2018 80.7 Osteopenia -2.1 0.743 g/cm2 DualFemur Total Mean 06/25/2017 79.6 Osteopenia -2.2 0.734 g/cm2 DualFemur Total Mean 06/21/2016 78.6 Osteopenia -2.1 0.742 g/cm2 DualFemur Total Mean 06/15/2015 77.6 Osteopenia -2.0 0.756 g/cm2 DualFemur Total Mean 08/21/2013 75.8 Osteopenia -1.7 0.789 g/cm2 Left Forearm Radius 33% 07/01/2019 81.7 Osteoporosis -3.0 0.615 g/cm2 Left Forearm Radius 33% 07/10/2018 80.7 Osteoporosis -2.6 0.647 g/cm2 Left Forearm Radius 33% 06/25/2017 79.6 Osteopenia -2.1 0.695 g/cm2 Left Forearm Radius 33%  06/21/2016 78.6 Osteopenia -1.9 0.712 g/cm2 Left Forearm Radius 33% 06/15/2015 77.6 Osteoporosis -2.7 0.638 g/cm2 Left Forearm Radius 33% 08/21/2013 75.8 Osteopenia -1.7 0.724 g/cm2 World Health Organization [facility]) criteria for post-menopausal, Caucasian Women: Normal:       T-score at or above -1 SD Osteopenia:   T-score between -1 and -2.5 SD Osteoporosis: T-score at or below -2.5 SD RECOMMENDATIONS: 1. All patients should optimize calcium and vitamin D intake. 2. Consider FDA-approved medical therapies in postmenopausal women and men aged 6 years and older, based on the following: a. A hip or  vertebral(clinical or morphometric) fracture b. T-score < -2.5 at the femoral neck or spine after appropriate evaluation to exclude secondary causes c. Low bone mass (T-score between -1.0 and -2.5 at the femoral neck or spine) and a 10-year probability of a hip fracture > 3% or a 10-year probability of a major osteoporosis-related fracture > 20% based on the [place]-adapted WHO algorithm d. Clinician judgment and/or patient preferences may indicate treatment for people with 10-year fracture probabilities above or below these levels FOLLOW-UP: People with diagnosed cases of osteoporosis or at high risk for fracture should have regular bone mineral density tests. For patients eligible for Medicare, routine testing is allowed once every 2 years. The testing frequency can be increased to one year for patients who have rapidly progressing disease, those who are receiving or discontinuing medical therapy to restore bone mass, or have additional risk factors. Electronically Signed   By: Marlaine Hind M.D.   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  PLAN:    1.  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  Patient  completed 5 years of letrozole in May 2019.  No further interventions are needed.  Patient has had bilateral mastectomies, therefore no further mammograms are necessary.  Return to clinic as scheduled for Prolia. 2.  Osteoporosis: Patient's most recent bone mineral density on July 01, 2019 revealed a T score of -3.0 which was significantly worse than 1 year prior where her T score was reported -2.6.  Patient has received Prolia in the past, but not since October 2018.  Proceed with treatment today.  Continue calcium and vitamin D supplementation.  Return to clinic in 6 months for further evaluation and continuation of treatment.   3.  Tremors/Parkinson's: Stable. Treatment per neurology.   I spent a total of 30 minutes face-to-face with the patient of which greater than 50% of the visit was spent in counseling and coordination of care as detailed above.   Patient expressed understanding and was in agreement with this plan. She also understands that She can call clinic at any time with any questions, concerns, or complaints.   Breast cancer [facility])   Staging form: Breast, AJCC 7th Edition     Clinical stage from 09/22/2015: Stage IIA (T1c, N1, M0) - Signed by Lloyd Huger, MD on 09/22/2015   Lloyd Huger, MD   07/25/2019 7:10 AM

## 2019-07-23 ENCOUNTER — Other Ambulatory Visit: Payer: Self-pay

## 2019-07-23 ENCOUNTER — Ambulatory Visit: Payer: Medicare Other | Admitting: Pain Medicine

## 2019-07-23 DIAGNOSIS — C50211 Malignant neoplasm of upper-inner quadrant of right female breast: Secondary | ICD-10-CM

## 2019-07-24 ENCOUNTER — Inpatient Hospital Stay: Payer: Medicare Other | Attending: Oncology

## 2019-07-24 ENCOUNTER — Other Ambulatory Visit: Payer: Self-pay

## 2019-07-24 ENCOUNTER — Encounter: Payer: Self-pay | Admitting: Oncology

## 2019-07-24 ENCOUNTER — Inpatient Hospital Stay (HOSPITAL_BASED_OUTPATIENT_CLINIC_OR_DEPARTMENT_OTHER): Payer: Medicare Other | Admitting: Oncology

## 2019-07-24 ENCOUNTER — Inpatient Hospital Stay: Payer: Medicare Other

## 2019-07-24 VITALS — BP 129/73 | HR 58 | Resp 20 | Wt 180.0 lb

## 2019-07-24 DIAGNOSIS — Z79899 Other long term (current) drug therapy: Secondary | ICD-10-CM | POA: Insufficient documentation

## 2019-07-24 DIAGNOSIS — M8589 Other specified disorders of bone density and structure, multiple sites: Secondary | ICD-10-CM

## 2019-07-24 DIAGNOSIS — Z7982 Long term (current) use of aspirin: Secondary | ICD-10-CM | POA: Insufficient documentation

## 2019-07-24 DIAGNOSIS — C50211 Malignant neoplasm of upper-inner quadrant of right female breast: Secondary | ICD-10-CM

## 2019-07-24 DIAGNOSIS — Z79811 Long term (current) use of aromatase inhibitors: Secondary | ICD-10-CM | POA: Insufficient documentation

## 2019-07-24 DIAGNOSIS — M810 Age-related osteoporosis without current pathological fracture: Secondary | ICD-10-CM | POA: Diagnosis not present

## 2019-07-24 DIAGNOSIS — Z170 Estrogen receptor positive status [ER+]: Secondary | ICD-10-CM | POA: Insufficient documentation

## 2019-07-24 DIAGNOSIS — G20 Parkinson's disease: Secondary | ICD-10-CM | POA: Diagnosis not present

## 2019-07-24 DIAGNOSIS — I10 Essential (primary) hypertension: Secondary | ICD-10-CM | POA: Insufficient documentation

## 2019-07-24 DIAGNOSIS — Z9013 Acquired absence of bilateral breasts and nipples: Secondary | ICD-10-CM | POA: Diagnosis not present

## 2019-07-24 LAB — BASIC METABOLIC PANEL
Anion gap: 9 (ref 5–15)
BUN: 26 mg/dL — ABNORMAL HIGH (ref 8–23)
CO2: 28 mmol/L (ref 22–32)
Calcium: 9.5 mg/dL (ref 8.9–10.3)
Chloride: 97 mmol/L — ABNORMAL LOW (ref 98–111)
Creatinine, Ser: 1.51 mg/dL — ABNORMAL HIGH (ref 0.44–1.00)
GFR calc Af Amer: 37 mL/min — ABNORMAL LOW (ref >60)
GFR calc non Af Amer: 32 mL/min — ABNORMAL LOW (ref >60)
Glucose, Bld: 62 mg/dL — ABNORMAL LOW (ref 70–99)
Potassium: 4.9 mmol/L (ref 3.5–5.1)
Sodium: 134 mmol/L — ABNORMAL LOW (ref 135–145)

## 2019-07-24 MED ORDER — DENOSUMAB 60 MG/ML ~~LOC~~ SOSY
60.0000 mg | PREFILLED_SYRINGE | Freq: Once | SUBCUTANEOUS | Status: AC
  Administered 2019-07-24: 12:00:00 60 mg via SUBCUTANEOUS
  Filled 2019-07-24: qty 1

## 2019-07-24 NOTE — Progress Notes (Signed)
Patient denies any concerns today.  

## 2019-08-01 ENCOUNTER — Other Ambulatory Visit: Payer: Self-pay | Admitting: Internal Medicine

## 2019-08-01 DIAGNOSIS — S3992XA Unspecified injury of lower back, initial encounter: Secondary | ICD-10-CM | POA: Diagnosis not present

## 2019-08-01 DIAGNOSIS — M533 Sacrococcygeal disorders, not elsewhere classified: Secondary | ICD-10-CM | POA: Diagnosis not present

## 2019-08-01 DIAGNOSIS — M7918 Myalgia, other site: Secondary | ICD-10-CM

## 2019-08-01 DIAGNOSIS — N390 Urinary tract infection, site not specified: Secondary | ICD-10-CM | POA: Diagnosis not present

## 2019-08-01 DIAGNOSIS — R300 Dysuria: Secondary | ICD-10-CM | POA: Diagnosis not present

## 2019-08-04 ENCOUNTER — Other Ambulatory Visit: Payer: Self-pay | Admitting: Internal Medicine

## 2019-08-04 DIAGNOSIS — M7918 Myalgia, other site: Secondary | ICD-10-CM

## 2019-08-04 DIAGNOSIS — M159 Polyosteoarthritis, unspecified: Secondary | ICD-10-CM

## 2019-08-15 ENCOUNTER — Other Ambulatory Visit: Payer: Self-pay | Admitting: Pain Medicine

## 2019-08-19 ENCOUNTER — Encounter: Payer: Self-pay | Admitting: Pain Medicine

## 2019-08-20 ENCOUNTER — Other Ambulatory Visit: Payer: Self-pay

## 2019-08-20 ENCOUNTER — Ambulatory Visit: Payer: Medicare Other | Attending: Pain Medicine | Admitting: Pain Medicine

## 2019-08-20 DIAGNOSIS — M79604 Pain in right leg: Secondary | ICD-10-CM | POA: Diagnosis not present

## 2019-08-20 DIAGNOSIS — M545 Low back pain, unspecified: Secondary | ICD-10-CM

## 2019-08-20 DIAGNOSIS — G8929 Other chronic pain: Secondary | ICD-10-CM | POA: Diagnosis not present

## 2019-08-20 DIAGNOSIS — G894 Chronic pain syndrome: Secondary | ICD-10-CM | POA: Diagnosis not present

## 2019-08-20 DIAGNOSIS — M1611 Unilateral primary osteoarthritis, right hip: Secondary | ICD-10-CM | POA: Diagnosis not present

## 2019-08-20 DIAGNOSIS — M25551 Pain in right hip: Secondary | ICD-10-CM | POA: Diagnosis not present

## 2019-08-20 MED ORDER — HYDROCODONE-ACETAMINOPHEN 5-325 MG PO TABS: 1.0000 | ORAL_TABLET | Freq: Three times a day (TID) | ORAL | 0 refills | Status: DC | PRN

## 2019-08-20 NOTE — Progress Notes (Signed)
Pain Management Virtual Encounter Note - Virtual Visit via Telephone Telehealth (real-time audio visits between healthcare provider and patient).   Patient's Phone No. & Preferred Pharmacy:  [phone] (home); [phone] (mobile); (Preferred) [phone] No e-mail address on record  [place], [place] - [address] [place] [address] Phone: [phone] Fax: [phone]    Pre-screening note:  Our staff contacted Wanda Martinez and offered her an "in person", "face-to-face" appointment versus a telephone encounter. She indicated preferring the telephone encounter, at this time.   Reason for Virtual Visit: COVID-19*  Social distancing based on CDC and AMA recommendations.   I contacted Wanda Martinez on 08/20/2019 via telephone.      I clearly identified myself as Gaspar Cola, MD. I verified that I was speaking with the correct person using two identifiers (Name: Wanda Martinez, and date of birth: Jul 31, 1935).  Advanced Informed Consent I sought verbal advanced consent from Wanda Martinez for virtual visit interactions. I informed Wanda Martinez of possible security and privacy concerns, risks, and limitations associated with providing "not-in-person" medical evaluation and management services. I also informed Wanda Martinez of the availability of "in-person" appointments. Finally, I informed her that there would be a charge for the virtual visit and that she could be  personally, fully or partially, financially responsible for it. Wanda Martinez expressed understanding and agreed to proceed.   Historic Elements   Wanda Martinez is a 84 y.o. year old, female patient evaluated today after her last encounter by our practice on 08/04/2019. Wanda Martinez  has a past medical history of Acute postoperative pain (10/25/2017), Anemia, Arm pain (07/26/2015), Arthritis, Arthritis, degenerative (03/26/2014), Back pain (11/01/2013), Bladder  infection (06/2018), Breast cancer ([place]), Breast cancer ([place]), CHEST PAIN (04/29/2010), Chronic cystitis, Cystocele (02/22/2013), Cystocele, midline (08/19/2013), Degeneration of intervertebral disc of lumbosacral region (03/26/2014), DYSPNEA (04/29/2010), Enthesopathy of hip (03/26/2014), GERD (gastroesophageal reflux disease), Hiatal hernia, HTN (hypertension), Hypokalemia (06/2018), Hyponatremia (06/2018), LBP (low back pain) (03/26/2014), Neck pain (11/01/2013), Parkinson disease ([place]), Pneumonia (06/2018), Sinusitis (02/07/2015), Skin lesions (07/12/2014), and Urinary incontinence. She also  has a past surgical history that includes Breast biopsy (2013); Tonsillectomy and adenoidectomy (79); Vesicovaginal fistula closure w/ TAH (1983); Breast surgery (1986); Breast enhancement surgery (1987); Breast implant removal; Breast implant removal (Right, 08/29/2012); Mastectomy (08/2012); Abdominal hysterectomy; and Colonoscopy with propofol (N/A, 09/13/2016). Wanda Martinez has a current medication list which includes the following prescription(s): amlodipine, aspirin, calcium carbonate, cefdinir, citalopram, cyclobenzaprine, gabapentin, glucosamine sulfate, hydrochlorothiazide, hydrocodone-acetaminophen, hydrocodone-acetaminophen, hydrocodone-acetaminophen, letrozole, losartan, lubiprostone, magnesium oxide, meloxicam, metoprolol succinate, multivitamin, nystatin, omega-3, omeprazole, pantoprazole, and pravastatin. She  reports that she has never smoked. She has never used smokeless tobacco. She reports that she does not drink alcohol or use drugs. Wanda Martinez is allergic to sulfa antibiotics and vesicare [solifenacin].   HPI  Today, she is being contacted for medication management.  The patient indicates doing well with the current medication regimen. No adverse reactions or side effects reported to the medications.  The patient indicates still having some low back pain, bilaterally, with the right being worse than the left.  She is  also complaining of pain in the area of the right hip.  Upon talking to the patient, I was clear to me that she was a little confused about the radiofrequency ablation.  She was thinking that radiofrequency ablation was a single type of procedure as since she recently had a done for her SI joint, then she  could not have another one done for the next 6 months.  I clarify for the patient that the radiofrequency ablation is a "technique" that you applied to different types of procedures and what she had done on the last was a radiofrequency ablation of her SI joint.  However, I also explained to her that we did not do the radiofrequency ablation of her lumbar facets and that the last time that we did that was in 2019.  This means that there is always the possibility that this pain that she is experiencing in the lower back may be secondary to the upper lumbar facets.  She was also complaining of right hip pain and therefore I have decided to schedule her to come in for a diagnostic/palliative intra-articular hip joint injection under fluoroscopic guidance and IV sedation.  When she comes in for that face-to-face visit then I will evaluate what is going on with her back and hopefully this will help me figure out whether or not this is in fact secondary to the facet joints or if there is something else going on.  From the medical standpoint, she is having a couple of issues with her kidneys and her memory.  She asked [place] yesterday to call her early today since she was scheduled to go in for lab work for her PCP.  I suspect that they are looking at the kidney problems to see what is going on and to come up with a plan to help her.  Pharmacotherapy Assessment  Analgesic: Hydrocodone/APAP 5/325, 1 tab PO q 8 hrs (15 mg/day of hydrocodone) (has enough to last until 08/30/2019) MME/day:15mg /day.   Monitoring: Pharmacotherapy: No side-effects or adverse reactions reported. [place] PMP: PDMP reviewed during this encounter.        Compliance: No problems identified. Effectiveness: Clinically acceptable. Plan: Refer to "POC".  UDS:  Summary  Date Value Ref Range Status  01/08/2019 FINAL  Final    Comment:    ==================================================================== TOXASSURE SELECT 13 (MW) ==================================================================== Test                             Result       Flag       Units Drug Present and Declared for Prescription Verification   Hydrocodone                    1944         EXPECTED   ng/mg creat   Dihydrocodeine                 194          EXPECTED   ng/mg creat   Norhydrocodone                 2532         EXPECTED   ng/mg creat    Sources of hydrocodone include scheduled prescription    medications. Dihydrocodeine and norhydrocodone are expected    metabolites of hydrocodone. Dihydrocodeine is also available as a    scheduled prescription medication. ==================================================================== Test                      Result    Flag   Units      Ref Range   Creatinine              188  mg/dL      >=20 ==================================================================== Declared Medications:  The flagging and interpretation on this report are based on the  following declared medications.  Unexpected results may arise from  inaccuracies in the declared medications.  **Note: The testing scope of this panel includes these medications:  Hydrocodone (Hydrocodone-Acetaminophen)  **Note: The testing scope of this panel does not include following  reported medications:  Acetaminophen (Hydrocodone-Acetaminophen)  Acyclovir  Amlodipine Besylate  Aspirin  Calcium carbonate  Citalopram  Cyclobenzaprine  Gabapentin  Glucosamine  Hydrochlorothiazide  Letrozole  Losartan (Losartan Potassium)  Lubiprostone  Magnesium Oxide  Meloxicam  Metoprolol  Multivitamin  Omega-3 Fatty Acids  Omeprazole   Pantoprazole  Pravastatin ==================================================================== For clinical consultation, please call [phone]. ====================================================================    Laboratory Chemistry Profile (12 mo)  Renal: 01/08/2019: BUN/Creatinine Ratio 12 07/24/2019: BUN 26; Creatinine, Ser 1.51  Lab Results  Component Value Date   GFRAA 37 (L) 07/24/2019   GFRNONAA 32 (L) 07/24/2019   Hepatic: 07/17/2019: Albumin 4.2 Lab Results  Component Value Date   AST 21 07/17/2019   ALT 15 07/17/2019   Other: No results found for requested labs within last 8760 hours. Note: Above Lab results reviewed.  Imaging  Last 90 days:  Dg Bone Density  Result Date: 07/01/2019 EXAM: DUAL X-RAY ABSORPTIOMETRY (DXA) FOR BONE MINERAL DENSITY IMPRESSION: Technologist: SCE PATIENT BIOGRAPHICAL: Name: Wanda Martinez, Wanda Martinez Patient ID: [id number] Birth Date: 10-01-1935 Height: 63.0 in. Gender: Female Exam Date: 07/01/2019 Weight: 187.8 lbs. Indications: Advanced Age, Breast CA, Caucasian, Height Loss, High Risk Meds, History of Breast Cancer, Hysterectomy, Osteoarthritis, Postmenopausal, Scoliosis Fractures: Treatments: Calcium, Gabapentin, letrozole, Multi-Vitamin, Omeprazole, Protonix, Vitamin D ASSESSMENT: The BMD measured at Forearm Radius 33% is 0.615 g/cm2 with a T-score of -3.0. This patient is considered osteoporotic according to [facility]) criteria. The quality of the scan is good. Lumbar spine was not utilized due to advanced degenerative changes/scoliosis. Site Region Measured Measured WHO Young Adult BMD Date       Age      Classification T-score DualFemur Neck Left 07/01/2019 81.7 Osteoporosis -2.8 0.653 g/cm2 DualFemur Neck Left 07/10/2018 80.7 Osteopenia -2.4 0.699 g/cm2 DualFemur Neck Left 06/25/2017 79.6 Osteopenia -2.3 0.718 g/cm2 DualFemur Neck Left 06/21/2016 78.6 Osteopenia -2.3 0.712 g/cm2 DualFemur Neck Left 06/15/2015 77.6 Osteopenia  -1.8 0.788 g/cm2 DualFemur Neck Left 08/21/2013 75.8 Osteopenia -2.0 0.764 g/cm2 DualFemur Total Mean 07/01/2019 81.7 Osteoporosis -2.5 0.688 g/cm2 DualFemur Total Mean 07/10/2018 80.7 Osteopenia -2.1 0.743 g/cm2 DualFemur Total Mean 06/25/2017 79.6 Osteopenia -2.2 0.734 g/cm2 DualFemur Total Mean 06/21/2016 78.6 Osteopenia -2.1 0.742 g/cm2 DualFemur Total Mean 06/15/2015 77.6 Osteopenia -2.0 0.756 g/cm2 DualFemur Total Mean 08/21/2013 75.8 Osteopenia -1.7 0.789 g/cm2 Left Forearm Radius 33% 07/01/2019 81.7 Osteoporosis -3.0 0.615 g/cm2 Left Forearm Radius 33% 07/10/2018 80.7 Osteoporosis -2.6 0.647 g/cm2 Left Forearm Radius 33% 06/25/2017 79.6 Osteopenia -2.1 0.695 g/cm2 Left Forearm Radius 33% 06/21/2016 78.6 Osteopenia -1.9 0.712 g/cm2 Left Forearm Radius 33% 06/15/2015 77.6 Osteoporosis -2.7 0.638 g/cm2 Left Forearm Radius 33% 08/21/2013 75.8 Osteopenia -1.7 0.724 g/cm2 World Health Organization [facility]) criteria for post-menopausal, Caucasian Women: Normal:       T-score at or above -1 SD Osteopenia:   T-score between -1 and -2.5 SD Osteoporosis: T-score at or below -2.5 SD RECOMMENDATIONS: 1. All patients should optimize calcium and vitamin D intake. 2. Consider FDA-approved medical therapies in postmenopausal women and men aged 50 years and older, based on the following: a. A hip or vertebral(clinical or morphometric) fracture b. T-score < -  2.5 at the femoral neck or spine after appropriate evaluation to exclude secondary causes c. Low bone mass (T-score between -1.0 and -2.5 at the femoral neck or spine) and a 10-year probability of a hip fracture > 3% or a 10-year probability of a major osteoporosis-related fracture > 20% based on the [place]-adapted WHO algorithm d. Clinician judgment and/or patient preferences may indicate treatment for people with 10-year fracture probabilities above or below these levels FOLLOW-UP: People with diagnosed cases of osteoporosis or at high risk for fracture should have regular bone  mineral density tests. For patients eligible for Medicare, routine testing is allowed once every 2 years. The testing frequency can be increased to one year for patients who have rapidly progressing disease, those who are receiving or discontinuing medical therapy to restore bone mass, or have additional risk factors. Electronically Signed   By: Marlaine Hind M.D.   On: 07/01/2019 11:40   [facility] C-arm 1-60 Min No Report  Result Date: 06/16/2019 Fluoro was used, but no Radiologist interpretation will be provided. Please refer to "NOTES" tab for provider progress note.   Assessment  The primary encounter diagnosis was Chronic pain syndrome. Diagnoses of Chronic low back pain (Primary Area of Pain) (Bilateral) (R>L), Chronic lower extremity pain (Secondary Area of Pain) (Right), Chronic hip pain (Right), and Osteoarthritis of hip (Right) were also pertinent to this visit.  Plan of Care  I have discontinued Wanda Martinez's silver sulfADIAZINE and HYDROcodone-acetaminophen. I have also changed her HYDROcodone-acetaminophen. Additionally, I am having her start on HYDROcodone-acetaminophen and HYDROcodone-acetaminophen. Lastly, I am having her maintain her aspirin, calcium carbonate, multivitamin, lubiprostone, GLUCOSAMINE SULFATE PO, Omega-3, citalopram, omeprazole, gabapentin, letrozole, pravastatin, losartan, pantoprazole, amLODipine, hydrochlorothiazide, metoprolol succinate, nystatin, cyclobenzaprine, meloxicam, magnesium oxide, and cefdinir.  Pharmacotherapy (Medications Ordered): Meds ordered this encounter  Medications  . HYDROcodone-acetaminophen (NORCO/VICODIN) 5-325 MG tablet    Sig: Take 1 tablet by mouth every 8 (eight) hours as needed for severe pain. Must last 30 days    Dispense:  90 tablet    Refill:  0    Chronic Pain: STOP Act - Not applicable. Fill 1 day early if closed on scheduled refill date. Do not fill until: 08/30/2019. To last until: 09/29/2019. Instruct to avoid  benzodiazepines within 8 hours of opioid.  Marland Kitchen HYDROcodone-acetaminophen (NORCO/VICODIN) 5-325 MG tablet    Sig: Take 1 tablet by mouth every 8 (eight) hours as needed for severe pain. Must last 30 days    Dispense:  90 tablet    Refill:  0    Chronic Pain: STOP Act (Not applicable) Fill 1 day early if closed on refill date. Do not fill until: 09/29/2019. To last until: 10/29/2019. Avoid benzodiazepines within 8 hours of opioids  . HYDROcodone-acetaminophen (NORCO/VICODIN) 5-325 MG tablet    Sig: Take 1 tablet by mouth every 8 (eight) hours as needed for severe pain. Must last 30 days    Dispense:  90 tablet    Refill:  0    Chronic Pain: STOP Act (Not applicable) Fill 1 day early if closed on refill date. Do not fill until: 10/29/2019. To last until: 11/28/2019. Avoid benzodiazepines within 8 hours of opioids   Orders:  Orders Placed This Encounter  Procedures  . HIP INJECTION    Standing Status:   Future    Standing Expiration Date:   09/19/2019    Scheduling Instructions:     Side: Right-sided     Sedation: With Sedation.     Timeframe:  As soon as schedule allows   Follow-up plan:   Return in about 14 weeks (around 11/26/2019) for (VV), E/M, (MM).      Interventional management options:  Considering: Diagnosticbilateral cervical facet block  Possiblebilateral cervical facet RFA  Diagnostic right-sided CESI  Diagnosticintra-articular hip joint injection #2  Possible hip joint radiofrequency ablation   Palliative PRN treatment(s): Palliative bilateral trochanteric bursa injection #2  Palliative right vs bilateral lumbar facet blocks  Palliative left lumbar facet RFA #2 (last done on 02/12/2018) Palliative right lumbar facet RFA #3(last done on06/18/19) Palliative right S-I blocks  Palliative right S-I RFA #4 (last done on 06/05/2019) Palliative right suprascapular muscle TPI/MNB  Palliativeright  L3-4 LESI#3 Palliative right L5 TFESI #3    Recent Visits Date Type  Provider Dept  07/07/19 Office Visit Milinda Pointer, MD [facility]  06/05/19 Procedure visit Milinda Pointer, MD [facility]  Showing recent visits within past 90 days and meeting all other requirements   Today's Visits Date Type Provider Dept  08/20/19 Office Visit Milinda Pointer, MD [facility]  Showing today's visits and meeting all other requirements   Future Appointments No visits were found meeting these conditions.  Showing future appointments within next 90 days and meeting all other requirements   I discussed the assessment and treatment plan with the patient. The patient was provided an opportunity to ask questions and all were answered. The patient agreed with the plan and demonstrated an understanding of the instructions.  Patient advised to call back or seek an in-person evaluation if the symptoms or condition worsens.  Total duration of non-face-to-face encounter: 13 minutes.  Note by: Gaspar Cola, MD Date: 08/20/2019; Time: 8:03 AM  Note: This dictation was prepared with Dragon dictation. Any transcriptional errors that may result from this process are unintentional.  Disclaimer:  * Given the special circumstances of the COVID-19 pandemic, the federal government has announced that the Office for Civil Rights (OCR) will exercise its enforcement discretion and will not impose penalties on physicians using telehealth in the event of noncompliance with regulatory requirements under the [place] (HIPAA) in connection with the good faith provision of telehealth during the XX123456 national public health emergency. ([place])

## 2019-08-20 NOTE — Patient Instructions (Signed)

## 2019-08-21 ENCOUNTER — Other Ambulatory Visit: Payer: Self-pay

## 2019-08-21 ENCOUNTER — Other Ambulatory Visit (INDEPENDENT_AMBULATORY_CARE_PROVIDER_SITE_OTHER): Payer: Medicare Other

## 2019-08-21 DIAGNOSIS — R944 Abnormal results of kidney function studies: Secondary | ICD-10-CM | POA: Diagnosis not present

## 2019-08-21 DIAGNOSIS — D649 Anemia, unspecified: Secondary | ICD-10-CM

## 2019-08-22 LAB — CBC WITH DIFFERENTIAL/PLATELET
Basophils Absolute: 0.2 10*3/uL — ABNORMAL HIGH (ref 0.0–0.1)
Basophils Relative: 2.2 % (ref 0.0–3.0)
Eosinophils Absolute: 0.5 10*3/uL (ref 0.0–0.7)
Eosinophils Relative: 6.7 % — ABNORMAL HIGH (ref 0.0–5.0)
HCT: 32.4 % — ABNORMAL LOW (ref 36.0–46.0)
Hemoglobin: 10.8 g/dL — ABNORMAL LOW (ref 12.0–15.0)
Lymphocytes Relative: 27.8 % (ref 12.0–46.0)
Lymphs Abs: 2.1 10*3/uL (ref 0.7–4.0)
MCHC: 33.3 g/dL (ref 30.0–36.0)
MCV: 89.9 fl (ref 78.0–100.0)
Monocytes Absolute: 0.8 10*3/uL (ref 0.1–1.0)
Monocytes Relative: 9.7 % (ref 3.0–12.0)
Neutro Abs: 4.1 10*3/uL (ref 1.4–7.7)
Neutrophils Relative %: 53.6 % (ref 43.0–77.0)
Platelets: 243 10*3/uL (ref 150.0–400.0)
RBC: 3.6 Mil/uL — ABNORMAL LOW (ref 3.87–5.11)
RDW: 13.6 % (ref 11.5–15.5)
WBC: 7.7 10*3/uL (ref 4.0–10.5)

## 2019-08-22 LAB — BASIC METABOLIC PANEL
BUN: 32 mg/dL — ABNORMAL HIGH (ref 6–23)
CO2: 26 mEq/L (ref 19–32)
Calcium: 9.5 mg/dL (ref 8.4–10.5)
Chloride: 96 mEq/L (ref 96–112)
Creatinine, Ser: 1.5 mg/dL — ABNORMAL HIGH (ref 0.40–1.20)
GFR: 33.26 mL/min — ABNORMAL LOW (ref >60.00)
Glucose, Bld: 82 mg/dL (ref 70–99)
Potassium: 4.7 mEq/L (ref 3.5–5.1)
Sodium: 131 mEq/L — ABNORMAL LOW (ref 135–145)

## 2019-08-22 LAB — IBC + FERRITIN
Ferritin: 84.3 ng/mL (ref 10.0–291.0)
Iron: 59 ug/dL (ref 42–145)
Saturation Ratios: 18.1 % — ABNORMAL LOW (ref 20.0–50.0)
Transferrin: 233 mg/dL (ref 212.0–360.0)

## 2019-08-22 LAB — TSH: TSH: 7.67 u[IU]/mL — ABNORMAL HIGH (ref 0.35–4.50)

## 2019-08-27 ENCOUNTER — Other Ambulatory Visit: Payer: Self-pay | Admitting: Internal Medicine

## 2019-08-27 DIAGNOSIS — N183 Chronic kidney disease, stage 3 unspecified: Secondary | ICD-10-CM

## 2019-08-27 DIAGNOSIS — D649 Anemia, unspecified: Secondary | ICD-10-CM

## 2019-08-27 NOTE — Progress Notes (Signed)
Orders placed for f/u labs.  

## 2019-08-30 ENCOUNTER — Other Ambulatory Visit: Payer: Self-pay | Admitting: Internal Medicine

## 2019-10-09 ENCOUNTER — Telehealth: Payer: Self-pay

## 2019-10-09 NOTE — Telephone Encounter (Signed)
Copied from [place] [id number]. Topic: General - Other >> Oct 09, 2019  3:27 PM Ivar Drape wrote: Reason for CRM:  Patient stated she received a call from Dr. Bary Leriche medical assistant concerning her labs.  Please return call.

## 2019-10-10 NOTE — Telephone Encounter (Signed)
Pt called back in to talk with Larena Glassman, office was on other call .  Best number for pt is [phone]

## 2019-10-10 NOTE — Telephone Encounter (Signed)
LMTCB

## 2019-10-14 ENCOUNTER — Telehealth: Payer: Self-pay

## 2019-10-14 DIAGNOSIS — E871 Hypo-osmolality and hyponatremia: Secondary | ICD-10-CM

## 2019-10-14 DIAGNOSIS — D649 Anemia, unspecified: Secondary | ICD-10-CM

## 2019-10-14 DIAGNOSIS — E7800 Pure hypercholesterolemia, unspecified: Secondary | ICD-10-CM

## 2019-10-14 DIAGNOSIS — N1831 Chronic kidney disease, stage 3a: Secondary | ICD-10-CM

## 2019-10-14 NOTE — Telephone Encounter (Signed)
Spoke with pt regarding 08/2019 labs. She is not on any thyroid medication. She is not taking any anti inflammatories. She is agreeable to renal ultrasound. Regarding her hctz, she is taking 1/2 tablet q day and has not had to use the extra 1/2 tablet much at all over the last few months. She has not been checking her BP. While on the phone, she mentioned to me that she has been having some SOB on exertion and feeling tired. Confirmed that this has been going on > than 1 month. She has not been around anyone or been out anywhere. Completed screening for her to come in the office. Discussed this with Dr Nicki Reaper. Scheduled pt for Friday at 12:00. Pt will need follow up labs if there is anything that you would like to add.

## 2019-10-14 NOTE — Telephone Encounter (Signed)
I have placed order for labs and renal ultrasound.

## 2019-10-15 ENCOUNTER — Other Ambulatory Visit: Payer: Medicare Other

## 2019-10-15 NOTE — Telephone Encounter (Signed)
See other phone note. Spoke with patient. Scheduled appt with Dr Nicki Reaper for 10/30

## 2019-10-17 ENCOUNTER — Other Ambulatory Visit: Payer: Self-pay

## 2019-10-17 ENCOUNTER — Ambulatory Visit (INDEPENDENT_AMBULATORY_CARE_PROVIDER_SITE_OTHER): Payer: Medicare Other

## 2019-10-17 ENCOUNTER — Ambulatory Visit (INDEPENDENT_AMBULATORY_CARE_PROVIDER_SITE_OTHER): Payer: Medicare Other | Admitting: Internal Medicine

## 2019-10-17 VITALS — BP 130/78 | HR 62 | Temp 96.2°F | Resp 16 | Wt 190.8 lb

## 2019-10-17 DIAGNOSIS — E7800 Pure hypercholesterolemia, unspecified: Secondary | ICD-10-CM | POA: Diagnosis not present

## 2019-10-17 DIAGNOSIS — G20 Parkinson's disease: Secondary | ICD-10-CM

## 2019-10-17 DIAGNOSIS — R0602 Shortness of breath: Secondary | ICD-10-CM

## 2019-10-17 DIAGNOSIS — D649 Anemia, unspecified: Secondary | ICD-10-CM

## 2019-10-17 DIAGNOSIS — C50211 Malignant neoplasm of upper-inner quadrant of right female breast: Secondary | ICD-10-CM

## 2019-10-17 DIAGNOSIS — C170 Malignant neoplasm of duodenum: Secondary | ICD-10-CM

## 2019-10-17 DIAGNOSIS — R300 Dysuria: Secondary | ICD-10-CM

## 2019-10-17 DIAGNOSIS — N1831 Chronic kidney disease, stage 3a: Secondary | ICD-10-CM

## 2019-10-17 DIAGNOSIS — K219 Gastro-esophageal reflux disease without esophagitis: Secondary | ICD-10-CM | POA: Diagnosis not present

## 2019-10-17 DIAGNOSIS — I10 Essential (primary) hypertension: Secondary | ICD-10-CM | POA: Diagnosis not present

## 2019-10-17 DIAGNOSIS — G20A1 Parkinson's disease without dyskinesia, without mention of fluctuations: Secondary | ICD-10-CM

## 2019-10-17 LAB — HEPATIC FUNCTION PANEL
ALT: 20 U/L (ref 0–35)
AST: 28 U/L (ref 0–37)
Albumin: 4.4 g/dL (ref 3.5–5.2)
Alkaline Phosphatase: 50 U/L (ref 39–117)
Bilirubin, Direct: 0.1 mg/dL (ref 0.0–0.3)
Total Bilirubin: 0.4 mg/dL (ref 0.2–1.2)
Total Protein: 7.6 g/dL (ref 6.0–8.3)

## 2019-10-17 LAB — CBC WITH DIFFERENTIAL/PLATELET
Basophils Absolute: 0.1 10*3/uL (ref 0.0–0.1)
Basophils Relative: 1 % (ref 0.0–3.0)
Eosinophils Absolute: 0.5 10*3/uL (ref 0.0–0.7)
Eosinophils Relative: 6.9 % — ABNORMAL HIGH (ref 0.0–5.0)
HCT: 33.4 % — ABNORMAL LOW (ref 36.0–46.0)
Hemoglobin: 11.2 g/dL — ABNORMAL LOW (ref 12.0–15.0)
Lymphocytes Relative: 25.7 % (ref 12.0–46.0)
Lymphs Abs: 1.9 10*3/uL (ref 0.7–4.0)
MCHC: 33.6 g/dL (ref 30.0–36.0)
MCV: 89.2 fl (ref 78.0–100.0)
Monocytes Absolute: 0.7 10*3/uL (ref 0.1–1.0)
Monocytes Relative: 9.1 % (ref 3.0–12.0)
Neutro Abs: 4.2 10*3/uL (ref 1.4–7.7)
Neutrophils Relative %: 57.3 % (ref 43.0–77.0)
Platelets: 229 10*3/uL (ref 150.0–400.0)
RBC: 3.74 Mil/uL — ABNORMAL LOW (ref 3.87–5.11)
RDW: 13.3 % (ref 11.5–15.5)
WBC: 7.3 10*3/uL (ref 4.0–10.5)

## 2019-10-17 LAB — BASIC METABOLIC PANEL
BUN: 25 mg/dL — ABNORMAL HIGH (ref 6–23)
CO2: 29 mEq/L (ref 19–32)
Calcium: 9.6 mg/dL (ref 8.4–10.5)
Chloride: 97 mEq/L (ref 96–112)
Creatinine, Ser: 1.13 mg/dL (ref 0.40–1.20)
GFR: 46.1 mL/min — ABNORMAL LOW (ref >60.00)
Glucose, Bld: 80 mg/dL (ref 70–99)
Potassium: 4.5 mEq/L (ref 3.5–5.1)
Sodium: 134 mEq/L — ABNORMAL LOW (ref 135–145)

## 2019-10-17 LAB — FERRITIN: Ferritin: 65.4 ng/mL (ref 10.0–291.0)

## 2019-10-17 LAB — TSH: TSH: 5.61 u[IU]/mL — ABNORMAL HIGH (ref 0.35–4.50)

## 2019-10-17 MED ORDER — NYSTATIN 100000 UNIT/ML MT SUSP: OROMUCOSAL | 0 refills | Status: DC

## 2019-10-17 NOTE — Progress Notes (Signed)
Patient ID: Wanda Martinez, female   DOB: 03/18/1935, 84 y.o.   MRN: [id number]   Subjective:    Patient ID: Wanda Martinez, female    DOB: 10/05/1935, 84 y.o.   MRN: [id number]  HPI  Patient here as a work in appt. with concerns regarding sob with exertion.  Some fatigue.  No chest pain.  She is eating.  No nausea or vomiting.  No cough or congestion.  No fever.  No abdominal pain reported.  Bowels moving.     Past Medical History:  Diagnosis Date   Acute postoperative pain 10/25/2017   Anemia    Arm pain 07/26/2015   Arthritis    Arthritis, degenerative 03/26/2014   Back pain 11/01/2013   Bladder infection 06/2018   Breast cancer ([place])    Masectomy - left - 1986    Breast cancer [facility])    Mastectomy-right -2014   CHEST PAIN 04/29/2010   Qualifier: Diagnosis of  By: Wynetta Emery RN, Erika     Chronic cystitis    Cystocele 02/22/2013   Cystocele, midline 08/19/2013   Degeneration of intervertebral disc of lumbosacral region 03/26/2014   DYSPNEA 04/29/2010   Qualifier: Diagnosis of  By: Wynetta Emery RN, Erika     Enthesopathy of hip 03/26/2014   GERD (gastroesophageal reflux disease)    Hiatal hernia    HTN (hypertension)    Hypokalemia 06/2018   Hyponatremia 06/2018   LBP (low back pain) 03/26/2014   Neck pain 11/01/2013   Parkinson disease ([place])    Pneumonia 06/2018   Sinusitis 02/07/2015   Skin lesions 07/12/2014   Urinary incontinence    mixed    Past Surgical History:  Procedure Laterality Date   ABDOMINAL HYSTERECTOMY     BREAST BIOPSY  2013   BREAST ENHANCEMENT SURGERY  1987   BREAST IMPLANT REMOVAL     BREAST IMPLANT REMOVAL Right 08/29/2012   BREAST SURGERY  1986   s/p left mastectomy   COLONOSCOPY WITH PROPOFOL N/A 09/13/2016   Procedure: COLONOSCOPY WITH PROPOFOL;  Surgeon: Manya Silvas, MD;  Location: [facility];  Service: Endoscopy;  Laterality: N/A;   MASTECTOMY  08/2012   right   TONSILLECTOMY AND ADENOIDECTOMY  71    VESICOVAGINAL FISTULA CLOSURE W/ TAH  1983   Family History  Problem Relation Age of Onset   Diabetes Father    Stroke Father    Colon polyps Father    Stroke Mother    Parkinson's disease Mother    Social History   Socioeconomic History   Marital status: Married    Spouse name: Not on file   Number of children: 3   Years of education: Not on file   Highest education level: Not on file  Occupational History   Not on file  Social Needs   Financial resource strain: Not hard at all   Food insecurity    Worry: Never true    Inability: Never true   Transportation needs    Medical: No    Non-medical: No  Tobacco Use   Smoking status: Never Smoker   Smokeless tobacco: Never Used   Tobacco comment: tobacco use - no  Substance and Sexual Activity   Alcohol use: No    Alcohol/week: 0.0 standard drinks   Drug use: No   Sexual activity: Never  Lifestyle   Physical activity    Days per week: Not on file    Minutes per session: Not on file   Stress: Not at all  Relationships   Social connections    Talks on phone: Not on file    Gets together: Not on file    Attends religious service: Not on file    Active member of club or organization: Not on file    Attends meetings of clubs or organizations: Not on file    Relationship status: Not on file  Other Topics Concern   Not on file  Social History Narrative   Not on file    Outpatient Encounter Medications as of 10/17/2019  Medication Sig   amLODipine (NORVASC) 2.5 MG tablet TAKE 1 TABLET BY MOUTH ONCE DAILY.   aspirin 81 MG EC tablet Take 81 mg by mouth daily as needed.     Calcium Carbonate (CALCIUM 600) 1500 MG TABS Take 1 tablet by mouth daily.     citalopram (CELEXA) 20 MG tablet TAKE 1 & 1/2 TABLETS BY MOUTH ONCE DAILY   cyclobenzaprine (FLEXERIL) 10 MG tablet TAKE ONE TABLET BY MOUTH AT BEDTIME.   gabapentin (NEURONTIN) 100 MG capsule Take 200 mg by mouth 3 (three) times daily.     GLUCOSAMINE SULFATE PO Take by mouth daily.    hydrochlorothiazide (HYDRODIURIL) 25 MG tablet TAKE 1 TABLET BY MOUTH ONCE DAILY. (PATIENT TAKES 1/2 ONCE DAILY AND EXTRA 1/2 IF NEEDED)   HYDROcodone-acetaminophen (NORCO/VICODIN) 5-325 MG tablet Take 1 tablet by mouth every 8 (eight) hours as needed for severe pain. Must last 30 days   HYDROcodone-acetaminophen (NORCO/VICODIN) 5-325 MG tablet Take 1 tablet by mouth every 8 (eight) hours as needed for severe pain. Must last 30 days   [START ON 10/29/2019] HYDROcodone-acetaminophen (NORCO/VICODIN) 5-325 MG tablet Take 1 tablet by mouth every 8 (eight) hours as needed for severe pain. Must last 30 days   letrozole (FEMARA) 2.5 MG tablet Take 2.5 mg by mouth daily.   losartan (COZAAR) 50 MG tablet TAKE (2) TABLETS BY MOUTH ONCE DAILY.   lubiprostone (AMITIZA) 24 MCG capsule TAKE 1 CAPSULE BY MOUTH ONCE DAILY WITH BREAKFAST.   magnesium oxide (MAG-OX) 400 (241.3 Mg) MG tablet TAKE 1 TABLET BY MOUTH ONCE DAILY.   meloxicam (MOBIC) 7.5 MG tablet TAKE 1 TABLET BY MOUTH ONCE DAILY.   metoprolol succinate (TOPROL-XL) 25 MG 24 hr tablet TAKE 1 TABLET BY MOUTH TWICE DAILY   Multiple Vitamin (MULTIVITAMIN) tablet Take 1 tablet by mouth daily.   nystatin (MYCOSTATIN) 100000 UNIT/ML suspension Swish and spit tid   Omega-3 1000 MG CAPS Take by mouth daily.    omeprazole (PRILOSEC) 20 MG capsule TAKE 1 CAPSULE BY MOUTH TWICE DAILY FOR REFLUX.   pantoprazole (PROTONIX) 40 MG tablet TAKE 1 TABLET BY MOUTH ONCE DAILY.   pravastatin (PRAVACHOL) 20 MG tablet TAKE 1 TABLET BY MOUTH ONCE DAILY.   [DISCONTINUED] cefdinir (OMNICEF) 300 MG capsule    [DISCONTINUED] nystatin (MYCOSTATIN) 100000 UNIT/ML suspension Take 5 mLs (500,000 Units total) by mouth 3 (three) times daily. Swish and spit tid   No facility-administered encounter medications on file as of 10/17/2019.    Review of Systems  Constitutional: Negative for appetite change and unexpected  weight change.  HENT: Negative for congestion and sinus pressure.   Respiratory: Positive for shortness of breath. Negative for cough and chest tightness.   Cardiovascular: Negative for chest pain, palpitations and leg swelling.  Gastrointestinal: Negative for abdominal pain, diarrhea, nausea and vomiting.  Genitourinary: Negative for difficulty urinating and dysuria.  Musculoskeletal: Negative for joint swelling and myalgias.  Skin: Negative for color change and rash.  Neurological:  Negative for dizziness, light-headedness and headaches.  Psychiatric/Behavioral: Negative for agitation and dysphoric mood.       Objective:    Physical Exam Constitutional:      General: She is not in acute distress.    Appearance: Normal appearance.  HENT:     Head: Normocephalic and atraumatic.     Right Ear: External ear normal.     Left Ear: External ear normal.  Eyes:     General: No scleral icterus.       Right eye: No discharge.        Left eye: No discharge.     Conjunctiva/sclera: Conjunctivae normal.  Neck:     Musculoskeletal: Neck supple. No muscular tenderness.     Thyroid: No thyromegaly.  Cardiovascular:     Rate and Rhythm: Normal rate and regular rhythm.  Pulmonary:     Effort: No respiratory distress.     Breath sounds: Normal breath sounds. No wheezing.  Abdominal:     General: Bowel sounds are normal.     Palpations: Abdomen is soft.     Tenderness: There is no abdominal tenderness.  Musculoskeletal:        General: No swelling or tenderness.  Lymphadenopathy:     Cervical: No cervical adenopathy.  Skin:    Findings: No erythema or rash.  Neurological:     Mental Status: She is alert.  Psychiatric:        Mood and Affect: Mood normal.        Behavior: Behavior normal.     BP 130/78    Pulse 62    Temp (!) 96.2 F (35.7 C)    Resp 16    Wt 190 lb 12.8 oz (86.5 kg)    LMP 12/18/1981    SpO2 99%    BMI 32.75 kg/m  Wt Readings from Last 3 Encounters:  10/17/19  190 lb 12.8 oz (86.5 kg)  07/24/19 180 lb (81.6 kg)  06/05/19 180 lb (81.6 kg)     Lab Results  Component Value Date   WBC 7.3 10/17/2019   HGB 11.2 (L) 10/17/2019   HCT 33.4 (L) 10/17/2019   PLT 229.0 10/17/2019   GLUCOSE 80 10/17/2019   CHOL 143 07/17/2019   TRIG 170.0 (H) 07/17/2019   HDL 38.60 (L) 07/17/2019   LDLDIRECT 73.0 07/09/2018   LDLCALC 71 07/17/2019   ALT 20 10/17/2019   AST 28 10/17/2019   NA 134 (L) 10/17/2019   K 4.5 10/17/2019   CL 97 10/17/2019   CREATININE 1.13 10/17/2019   BUN 25 (H) 10/17/2019   CO2 29 10/17/2019   TSH 5.61 (H) 10/17/2019    Dg Bone Density  Result Date: 07/01/2019 EXAM: DUAL X-RAY ABSORPTIOMETRY (DXA) FOR BONE MINERAL DENSITY IMPRESSION: Technologist: SCE PATIENT BIOGRAPHICAL: Name: Wanda Martinez, Wanda Martinez Patient ID: [id number] Birth Date: 1935-01-04 Height: 63.0 in. Gender: Female Exam Date: 07/01/2019 Weight: 187.8 lbs. Indications: Advanced Age, Breast CA, Caucasian, Height Loss, High Risk Meds, History of Breast Cancer, Hysterectomy, Osteoarthritis, Postmenopausal, Scoliosis Fractures: Treatments: Calcium, Gabapentin, letrozole, Multi-Vitamin, Omeprazole, Protonix, Vitamin D ASSESSMENT: The BMD measured at Forearm Radius 33% is 0.615 g/cm2 with a T-score of -3.0. This patient is considered osteoporotic according to [facility]) criteria. The quality of the scan is good. Lumbar spine was not utilized due to advanced degenerative changes/scoliosis. Site Region Measured Measured WHO Young Adult BMD Date       Age      Classification T-score DualFemur Neck Left 07/01/2019  81.7 Osteoporosis -2.8 0.653 g/cm2 DualFemur Neck Left 07/10/2018 80.7 Osteopenia -2.4 0.699 g/cm2 DualFemur Neck Left 06/25/2017 79.6 Osteopenia -2.3 0.718 g/cm2 DualFemur Neck Left 06/21/2016 78.6 Osteopenia -2.3 0.712 g/cm2 DualFemur Neck Left 06/15/2015 77.6 Osteopenia -1.8 0.788 g/cm2 DualFemur Neck Left 08/21/2013 75.8 Osteopenia -2.0 0.764 g/cm2 DualFemur Total  Mean 07/01/2019 81.7 Osteoporosis -2.5 0.688 g/cm2 DualFemur Total Mean 07/10/2018 80.7 Osteopenia -2.1 0.743 g/cm2 DualFemur Total Mean 06/25/2017 79.6 Osteopenia -2.2 0.734 g/cm2 DualFemur Total Mean 06/21/2016 78.6 Osteopenia -2.1 0.742 g/cm2 DualFemur Total Mean 06/15/2015 77.6 Osteopenia -2.0 0.756 g/cm2 DualFemur Total Mean 08/21/2013 75.8 Osteopenia -1.7 0.789 g/cm2 Left Forearm Radius 33% 07/01/2019 81.7 Osteoporosis -3.0 0.615 g/cm2 Left Forearm Radius 33% 07/10/2018 80.7 Osteoporosis -2.6 0.647 g/cm2 Left Forearm Radius 33% 06/25/2017 79.6 Osteopenia -2.1 0.695 g/cm2 Left Forearm Radius 33% 06/21/2016 78.6 Osteopenia -1.9 0.712 g/cm2 Left Forearm Radius 33% 06/15/2015 77.6 Osteoporosis -2.7 0.638 g/cm2 Left Forearm Radius 33% 08/21/2013 75.8 Osteopenia -1.7 0.724 g/cm2 World Health Organization [facility]) criteria for post-menopausal, Caucasian Women: Normal:       T-score at or above -1 SD Osteopenia:   T-score between -1 and -2.5 SD Osteoporosis: T-score at or below -2.5 SD RECOMMENDATIONS: 1. All patients should optimize calcium and vitamin D intake. 2. Consider FDA-approved medical therapies in postmenopausal women and men aged 37 years and older, based on the following: a. A hip or vertebral(clinical or morphometric) fracture b. T-score < -2.5 at the femoral neck or spine after appropriate evaluation to exclude secondary causes c. Low bone mass (T-score between -1.0 and -2.5 at the femoral neck or spine) and a 10-year probability of a hip fracture > 3% or a 10-year probability of a major osteoporosis-related fracture > 20% based on the [place]-adapted WHO algorithm d. Clinician judgment and/or patient preferences may indicate treatment for people with 10-year fracture probabilities above or below these levels FOLLOW-UP: People with diagnosed cases of osteoporosis or at high risk for fracture should have regular bone mineral density tests. For patients eligible for Medicare, routine testing is allowed once every  2 years. The testing frequency can be increased to one year for patients who have rapidly progressing disease, those who are receiving or discontinuing medical therapy to restore bone mass, or have additional risk factors. Electronically Signed   By: Marlaine Hind M.D.   On: 07/01/2019 11:40       Assessment & Plan:   Problem List Items Addressed This Visit    Anemia    Colonoscopy 2017 - diverticulosis and internal hemorrhoids.  Follow cbc.       CKD (chronic kidney disease) stage 3, GFR 30-59 ml/min (HCC)    Avoid antiinflammatories.  Follow metabolic panel.        Dysuria    Did report some odor at times.  Check urine to confirm no infection.        Relevant Orders   Urinalysis, Routine w reflex microscopic (Completed)   Urine Culture (Completed)   Essential hypertension    Blood pressure under good control.  Continue same medication regimen.  Follow pressures.  Follow metabolic panel.        GERD (gastroesophageal reflux disease)    Controlled.        Hypercholesterolemia    On pravastatin.  Low cholesterol diet and exercise.  Follow lipid panel and liver function tests.        Parkinsons ([place])    Followed by neurology.  Stable.        Primary cancer of upper  inner quadrant of right female breast [facility])    S/p bilateraol mastectomy.  On letrozole.  Followed by oncology.        SOB (shortness of breath)    Does report sob with exertion.  EKG - SR with no acute ischemic changes.  Will check cxr.  Also check cbc - to confirm no worsening anemia.  Will have cardiology evaluate to confirm if any further cardiac w/up warranted.       Relevant Orders   Ambulatory referral to Cardiology    Other Visit Diagnoses    Shortness of breath    -  Primary   Relevant Orders   DG Chest 2 View (Completed)   EKG 12-Lead (Completed)       Einar Pheasant, MD

## 2019-10-19 LAB — URINALYSIS, ROUTINE W REFLEX MICROSCOPIC
Bilirubin Urine: NEGATIVE
Glucose, UA: NEGATIVE
Hgb urine dipstick: NEGATIVE
Ketones, ur: NEGATIVE
Leukocytes,Ua: NEGATIVE
Nitrite: NEGATIVE
Protein, ur: NEGATIVE
Specific Gravity, Urine: 1.02 (ref 1.001–1.03)
pH: 6.5 (ref 5.0–8.0)

## 2019-10-19 LAB — URINE CULTURE
MICRO NUMBER:: 1049308
Result:: NO GROWTH
SPECIMEN QUALITY:: ADEQUATE

## 2019-10-21 ENCOUNTER — Other Ambulatory Visit: Payer: Self-pay | Admitting: Internal Medicine

## 2019-10-21 DIAGNOSIS — D649 Anemia, unspecified: Secondary | ICD-10-CM

## 2019-10-21 DIAGNOSIS — R7989 Other specified abnormal findings of blood chemistry: Secondary | ICD-10-CM

## 2019-10-21 NOTE — Progress Notes (Signed)
Order placed for f/u labs.  

## 2019-10-22 ENCOUNTER — Other Ambulatory Visit: Payer: Self-pay | Admitting: Internal Medicine

## 2019-10-22 DIAGNOSIS — R739 Hyperglycemia, unspecified: Secondary | ICD-10-CM

## 2019-10-22 LAB — PROTEIN ELECTROPHORESIS, SERUM, WITH REFLEX
Abnormal Protein Band1: 1.1 g/dL — ABNORMAL HIGH
Albumin ELP: 4.2 g/dL (ref 3.8–4.8)
Alpha 1: 0.3 g/dL (ref 0.2–0.3)
Alpha 2: 0.8 g/dL (ref 0.5–0.9)
Beta 2: 0.2 g/dL (ref 0.2–0.5)
Beta Globulin: 0.4 g/dL (ref 0.4–0.6)
Gamma Globulin: 1.4 g/dL (ref 0.8–1.7)
Total Protein: 7.3 g/dL (ref 6.1–8.1)

## 2019-10-22 LAB — IFE INTERPRETATION: Immunofix Electr Int: DETECTED

## 2019-10-22 NOTE — Progress Notes (Signed)
Order placed for f/u labs.  

## 2019-10-23 ENCOUNTER — Other Ambulatory Visit: Payer: Self-pay | Admitting: Internal Medicine

## 2019-10-23 NOTE — Telephone Encounter (Signed)
-----   Message from Lloyd Huger, MD sent at 10/23/2019 10:38 AM EST ----- Regarding: RE: question Nope you are good.  I'll repeat SPEP next time I see her.  Thanks again! ----- Message ----- From: Einar Pheasant, MD Sent: 10/23/2019   9:50 AM EST To: Lloyd Huger, MD Subject: RE: question                                   Thank you for reviewing.   I was not sure what to make of the results of the protein electrophoresis and just wanted to make sure I did not need to do any other tests.    Thank you again.  Tyrone Balash ----- Message ----- From: Lloyd Huger, MD Sent: 10/23/2019   9:37 AM EST To: Einar Pheasant, MD Subject: RE: question                                   Hey [facility] Patient-   I would not worry about her anemia too much. Probably related to her renal insufficiency. She is above 11.0, so no treatment is needed anyway. I can follow up next time I see her for her breast cancer.  Thanks!  -Tim ----- Message ----- From: Cammie Sickle, MD Sent: 10/23/2019   6:52 AM EST To: Lloyd Huger, MD, Einar Pheasant, MD Subject: FW: question                                   Dr.Bilaal Leib- Looks like Dr.Finnegan follows the pt for her history of breast cancer. Im forwarding your query to Dr.Finn to respond to your query. GB ----- Message ----- From: Einar Pheasant, MD Sent: 10/23/2019   5:23 AM EST To: Cammie Sickle, MD Subject: question                                       I am sorry to bother you again.  Just had a question about whether or not any further work up warranted.  Ms Crapser had previous documented worsening renal function, anemia, etc.  With this last lab, her hemoglobin was stable.  Her kidney function was improved.  Her protein electrophoresis revealed - "restricted band (m spike)".   Thank you for your help.  Einar Pheasant

## 2019-10-25 ENCOUNTER — Encounter: Payer: Self-pay | Admitting: Internal Medicine

## 2019-10-25 DIAGNOSIS — C170 Malignant neoplasm of duodenum: Secondary | ICD-10-CM | POA: Insufficient documentation

## 2019-10-25 NOTE — Assessment & Plan Note (Signed)
S/p bilateraol mastectomy.  On letrozole.  Followed by oncology.

## 2019-10-25 NOTE — Assessment & Plan Note (Signed)
Followed by neurology.  Stable  

## 2019-10-25 NOTE — Assessment & Plan Note (Signed)
Controlled.  

## 2019-10-25 NOTE — Assessment & Plan Note (Signed)
Does report sob with exertion.  EKG - SR with no acute ischemic changes.  Will check cxr.  Also check cbc - to confirm no worsening anemia.  Will have cardiology evaluate to confirm if any further cardiac w/up warranted.

## 2019-10-25 NOTE — Assessment & Plan Note (Signed)
Avoid antiinflammatories.  Follow metabolic panel.  

## 2019-10-25 NOTE — Assessment & Plan Note (Signed)
Blood pressure under good control.  Continue same medication regimen.  Follow pressures.  Follow metabolic panel.   

## 2019-10-25 NOTE — Assessment & Plan Note (Signed)
Did report some odor at times.  Check urine to confirm no infection.

## 2019-10-25 NOTE — Assessment & Plan Note (Signed)
On pravastatin.  Low cholesterol diet and exercise.  Follow lipid panel and liver function tests.   

## 2019-10-25 NOTE — Assessment & Plan Note (Signed)
Colonoscopy 2017 - diverticulosis and internal hemorrhoids.  Follow cbc.  

## 2019-10-28 ENCOUNTER — Other Ambulatory Visit: Payer: Self-pay

## 2019-10-28 ENCOUNTER — Ambulatory Visit
Admission: RE | Admit: 2019-10-28 | Discharge: 2019-10-28 | Disposition: A | Discharge status: Home / Self Care | Payer: Medicare Other | Source: Ambulatory Visit | Attending: Internal Medicine | Admitting: Internal Medicine

## 2019-10-28 DIAGNOSIS — N1831 Chronic kidney disease, stage 3a: Secondary | ICD-10-CM | POA: Diagnosis not present

## 2019-10-28 DIAGNOSIS — N183 Chronic kidney disease, stage 3 unspecified: Secondary | ICD-10-CM | POA: Diagnosis not present

## 2019-11-03 ENCOUNTER — Other Ambulatory Visit: Payer: Self-pay | Admitting: Pain Medicine

## 2019-11-03 ENCOUNTER — Telehealth: Payer: Self-pay | Admitting: Pain Medicine

## 2019-11-03 DIAGNOSIS — M545 Low back pain, unspecified: Secondary | ICD-10-CM

## 2019-11-03 DIAGNOSIS — M47816 Spondylosis without myelopathy or radiculopathy, lumbar region: Secondary | ICD-10-CM

## 2019-11-03 DIAGNOSIS — G8929 Other chronic pain: Secondary | ICD-10-CM

## 2019-11-03 NOTE — Telephone Encounter (Signed)
Patient is having a lot of pain, She is wanting to have an RFA before the holidays. She had one in June 2020. She says Dr. Dossie Arbour told her she had to wait 6 months. Can you please check on this and let us know so we can schedule

## 2019-11-03 NOTE — Telephone Encounter (Signed)
Spoke with patient and she is hurting again on the right side and feels that she needs to have her SI joint RFA on the right side repeated.  She would very much like to have this done prior to the Holidays. I told her I would send this to Dr Dossie Arbour and see if he will put in the order.

## 2019-11-04 DIAGNOSIS — R0602 Shortness of breath: Secondary | ICD-10-CM | POA: Diagnosis not present

## 2019-11-04 DIAGNOSIS — R002 Palpitations: Secondary | ICD-10-CM | POA: Diagnosis not present

## 2019-11-04 DIAGNOSIS — E7800 Pure hypercholesterolemia, unspecified: Secondary | ICD-10-CM | POA: Diagnosis not present

## 2019-11-04 DIAGNOSIS — I10 Essential (primary) hypertension: Secondary | ICD-10-CM | POA: Diagnosis not present

## 2019-11-04 DIAGNOSIS — E669 Obesity, unspecified: Secondary | ICD-10-CM | POA: Diagnosis not present

## 2019-11-05 NOTE — Telephone Encounter (Signed)
Scheduled for tomorrow at 10 °

## 2019-11-06 ENCOUNTER — Ambulatory Visit (HOSPITAL_BASED_OUTPATIENT_CLINIC_OR_DEPARTMENT_OTHER): Payer: Medicare Other | Admitting: Pain Medicine

## 2019-11-06 ENCOUNTER — Ambulatory Visit
Admission: RE | Admit: 2019-11-06 | Discharge: 2019-11-06 | Disposition: A | Discharge status: Home / Self Care | Payer: Medicare Other | Source: Ambulatory Visit | Attending: Pain Medicine | Admitting: Pain Medicine

## 2019-11-06 ENCOUNTER — Other Ambulatory Visit: Payer: Self-pay

## 2019-11-06 ENCOUNTER — Encounter: Payer: Self-pay | Admitting: Pain Medicine

## 2019-11-06 VITALS — BP 165/87 | HR 66 | Temp 97.5°F | Resp 16 | Ht 64.0 in | Wt 187.0 lb

## 2019-11-06 DIAGNOSIS — M47816 Spondylosis without myelopathy or radiculopathy, lumbar region: Secondary | ICD-10-CM | POA: Diagnosis not present

## 2019-11-06 DIAGNOSIS — M25551 Pain in right hip: Secondary | ICD-10-CM | POA: Insufficient documentation

## 2019-11-06 DIAGNOSIS — M47817 Spondylosis without myelopathy or radiculopathy, lumbosacral region: Secondary | ICD-10-CM | POA: Diagnosis not present

## 2019-11-06 DIAGNOSIS — G894 Chronic pain syndrome: Secondary | ICD-10-CM | POA: Diagnosis not present

## 2019-11-06 DIAGNOSIS — M5137 Other intervertebral disc degeneration, lumbosacral region: Secondary | ICD-10-CM | POA: Diagnosis not present

## 2019-11-06 DIAGNOSIS — G8929 Other chronic pain: Secondary | ICD-10-CM | POA: Diagnosis not present

## 2019-11-06 DIAGNOSIS — M545 Low back pain: Secondary | ICD-10-CM | POA: Diagnosis not present

## 2019-11-06 DIAGNOSIS — M25552 Pain in left hip: Secondary | ICD-10-CM | POA: Diagnosis not present

## 2019-11-06 MED ORDER — ROPIVACAINE HCL 2 MG/ML IJ SOLN
9.0000 mL | Freq: Once | INTRAMUSCULAR | Status: AC
  Administered 2019-11-06: 9 mL via PERINEURAL
  Filled 2019-11-06: qty 10

## 2019-11-06 MED ORDER — HYDROCODONE-ACETAMINOPHEN 5-325 MG PO TABS: 1.0000 | ORAL_TABLET | Freq: Three times a day (TID) | ORAL | 0 refills | Status: DC | PRN

## 2019-11-06 MED ORDER — LACTATED RINGERS IV SOLN
1000.0000 mL | Freq: Once | INTRAVENOUS | Status: AC
  Administered 2019-11-06: 1000 mL via INTRAVENOUS

## 2019-11-06 MED ORDER — TRIAMCINOLONE ACETONIDE 40 MG/ML IJ SUSP
40.0000 mg | Freq: Once | INTRAMUSCULAR | Status: AC
  Administered 2019-11-06: 40 mg
  Filled 2019-11-06: qty 1

## 2019-11-06 MED ORDER — LIDOCAINE HCL 2 % IJ SOLN
20.0000 mL | Freq: Once | INTRAMUSCULAR | Status: AC
  Administered 2019-11-06: 400 mg
  Filled 2019-11-06: qty 40

## 2019-11-06 MED ORDER — FENTANYL CITRATE (PF) 100 MCG/2ML IJ SOLN
25.0000 ug | INTRAMUSCULAR | Status: DC | PRN
  Administered 2019-11-06: 50 ug via INTRAVENOUS
  Filled 2019-11-06: qty 2

## 2019-11-06 MED ORDER — MIDAZOLAM HCL 5 MG/5ML IJ SOLN
1.0000 mg | INTRAMUSCULAR | Status: DC | PRN
  Administered 2019-11-06: 1 mg via INTRAVENOUS
  Filled 2019-11-06: qty 5

## 2019-11-06 NOTE — Progress Notes (Signed)
Safety precautions to be maintained throughout the outpatient stay will include: orient to surroundings, keep bed in low position, maintain call bell within reach at all times, provide assistance with transfer out of bed and ambulation.  

## 2019-11-06 NOTE — Patient Instructions (Signed)

## 2019-11-06 NOTE — Progress Notes (Signed)
Patient's Name: Wanda Martinez  MRN: [id number]  Referring Provider: Einar Pheasant, MD  DOB: [birth year]  PCP: Einar Pheasant, MD  DOS: 11/06/2019  Note by: Gaspar Cola, MD  Service setting: Ambulatory outpatient  Specialty: Interventional Pain Management  Patient type: Established  Location: [facility] (AMB) Pain Management Facility  Visit type: Interventional Procedure   Primary Reason for Visit: Interventional Pain Management Treatment. CC: Back Pain (lower)  Procedure:          Anesthesia, Analgesia, Anxiolysis:  Type: Lumbar Facet, Medial Branch Block(s)          Primary Purpose: Diagnostic Region: Posterolateral Lumbosacral Spine Level: L2, L3, L4, L5, & S1 Medial Branch Level(s). Injecting these levels blocks the L3-4, L4-5, and L5-S1 lumbar facet joints. Laterality: Right  Type: Moderate (Conscious) Sedation combined with Local Anesthesia Indication(s): Analgesia and Anxiety Route: Intravenous (IV) IV Access: Secured Sedation: Meaningful verbal contact was maintained at all times during the procedure  Local Anesthetic: Lidocaine 1-2%  Position: Prone   Indications: 1. Spondylosis without myelopathy or radiculopathy, lumbosacral region   2. Lumbar facet syndrome (Bilateral) (R>L)   3. Lumbar spondylosis   4. DDD (degenerative disc disease), lumbosacral   5. Chronic low back pain (Primary Area of Pain) (Bilateral) (R>L)   6. Chronic hip pain (Bilateral)    Pain Score: Pre-procedure: 7 /10 Post-procedure: 0-No pain/10   Pre-op Assessment:  Wanda Martinez is a 84 y.o. (year old), female patient, seen today for interventional treatment. She  has a past surgical history that includes Breast biopsy (2013); Tonsillectomy and adenoidectomy (79); Vesicovaginal fistula closure w/ TAH (1983); Breast surgery (1986); Breast enhancement surgery (1987); Breast implant removal; Breast implant removal (Right, 08/29/2012); Mastectomy (08/2012); Abdominal hysterectomy; and Colonoscopy with  propofol (N/A, 09/13/2016). Wanda Martinez has a current medication list which includes the following prescription(s): amlodipine, aspirin, calcium carbonate, citalopram, cyclobenzaprine, gabapentin, glucosamine sulfate, hydrochlorothiazide, hydrocodone-acetaminophen, hydrocodone-acetaminophen, hydrocodone-acetaminophen, letrozole, losartan, lubiprostone, magnesium oxide, meloxicam, metoprolol succinate, multivitamin, nystatin, omega-3, omeprazole, pantoprazole, and pravastatin, and the following Facility-Administered Medications: fentanyl and midazolam. Her primarily concern today is the Back Pain (lower)  Initial Vital Signs:  Pulse/HCG Rate: 66ECG Heart Rate: 68 Temp: (!) 97.3 F (36.3 C) Resp: 16 BP: 108/62 SpO2: 100 %  BMI: Estimated body mass index is 32.1 kg/m as calculated from the following:   Height as of this encounter: 5\' 4"  (1.626 m).   Weight as of this encounter: 187 lb (84.8 kg).  Risk Assessment: Allergies: Reviewed. She is allergic to sulfa antibiotics and vesicare [solifenacin].  Allergy Precautions: None required Coagulopathies: Reviewed. None identified.  Blood-thinner therapy: None at this time Active Infection(s): Reviewed. None identified. Wanda Martinez is afebrile  Site Confirmation: Wanda Martinez was asked to confirm the procedure and laterality before marking the site Procedure checklist: Completed Consent: Before the procedure and under the influence of no sedative(s), amnesic(s), or anxiolytics, the patient was informed of the treatment options, risks and possible complications. To fulfill our ethical and legal obligations, as recommended by the American Medical Association's Code of Ethics, I have informed the patient of my clinical impression; the nature and purpose of the treatment or procedure; the risks, benefits, and possible complications of the intervention; the alternatives, including doing nothing; the risk(s) and benefit(s) of the alternative treatment(s) or  procedure(s); and the risk(s) and benefit(s) of doing nothing. The patient was provided information about the general risks and possible complications associated with the procedure. These may include, but are not limited to: failure to achieve  desired goals, infection, bleeding, organ or nerve damage, allergic reactions, paralysis, and death. In addition, the patient was informed of those risks and complications associated to Spine-related procedures, such as failure to decrease pain; infection (i.e.: Meningitis, epidural or intraspinal abscess); bleeding (i.e.: epidural hematoma, subarachnoid hemorrhage, or any other type of intraspinal or peri-dural bleeding); organ or nerve damage (i.e.: Any type of peripheral nerve, nerve root, or spinal cord injury) with subsequent damage to sensory, motor, and/or autonomic systems, resulting in permanent pain, numbness, and/or weakness of one or several areas of the body; allergic reactions; (i.e.: anaphylactic reaction); and/or death. Furthermore, the patient was informed of those risks and complications associated with the medications. These include, but are not limited to: allergic reactions (i.e.: anaphylactic or anaphylactoid reaction(s)); adrenal axis suppression; blood sugar elevation that in diabetics may result in ketoacidosis or comma; water retention that in patients with history of congestive heart failure may result in shortness of breath, pulmonary edema, and decompensation with resultant heart failure; weight gain; swelling or edema; medication-induced neural toxicity; particulate matter embolism and blood vessel occlusion with resultant organ, and/or nervous system infarction; and/or aseptic necrosis of one or more joints. Finally, the patient was informed that Medicine is not an exact science; therefore, there is also the possibility of unforeseen or unpredictable risks and/or possible complications that may result in a catastrophic outcome. The patient  indicated having understood very clearly. We have given the patient no guarantees and we have made no promises. Enough time was given to the patient to ask questions, all of which were answered to the patient's satisfaction. Wanda Martinez has indicated that she wanted to continue with the procedure. Attestation: I, the ordering provider, attest that I have discussed with the patient the benefits, risks, side-effects, alternatives, likelihood of achieving goals, and potential problems during recovery for the procedure that I have provided informed consent. Date   Time: 11/06/2019 10:11 AM  Pre-Procedure Preparation:  Monitoring: As per clinic protocol. Respiration, ETCO2, SpO2, BP, heart rate and rhythm monitor placed and checked for adequate function Safety Precautions: Patient was assessed for positional comfort and pressure points before starting the procedure. Time-out: I initiated and conducted the "Time-out" before starting the procedure, as per protocol. The patient was asked to participate by confirming the accuracy of the "Time Out" information. Verification of the correct person, site, and procedure were performed and confirmed by me, the nursing staff, and the patient. "Time-out" conducted as per Joint Commission's Universal Protocol (UP.01.01.01). Time: 1103  Description of Procedure:          Laterality: Right Levels:  L2, L3, L4, L5, & S1 Medial Branch Level(s) Area Prepped: Posterior Lumbosacral Region Prepping solution: DuraPrep (Iodine Povacrylex [0.7% available iodine] and Isopropyl Alcohol, 74% w/w) Safety Precautions: Aspiration looking for blood return was conducted prior to all injections. At no point did we inject any substances, as a needle was being advanced. Before injecting, the patient was told to immediately notify me if she was experiencing any new onset of "ringing in the ears, or metallic taste in the mouth". No attempts were made at seeking any paresthesias. Safe injection  practices and needle disposal techniques used. Medications properly checked for expiration dates. SDV (single dose vial) medications used. After the completion of the procedure, all disposable equipment used was discarded in the proper designated medical waste containers. Local Anesthesia: Protocol guidelines were followed. The patient was positioned over the fluoroscopy table. The area was prepped in the usual manner. The time-out was  completed. The target area was identified using fluoroscopy. A 12-in long, straight, sterile hemostat was used with fluoroscopic guidance to locate the targets for each level blocked. Once located, the skin was marked with an approved surgical skin marker. Once all sites were marked, the skin (epidermis, dermis, and hypodermis), as well as deeper tissues (fat, connective tissue and muscle) were infiltrated with a small amount of a short-acting local anesthetic, loaded on a 10cc syringe with a 25G, 1.5-in  Needle. An appropriate amount of time was allowed for local anesthetics to take effect before proceeding to the next step. Local Anesthetic: Lidocaine 2.0% The unused portion of the local anesthetic was discarded in the proper designated containers. Technical explanation of process:  L2 Medial Branch Nerve Block (MBB): The target area for the L2 medial branch is at the junction of the postero-lateral aspect of the superior articular process and the superior, posterior, and medial edge of the transverse process of L3. Under fluoroscopic guidance, a Quincke needle was inserted until contact was made with os over the superior postero-lateral aspect of the pedicular shadow (target area). After negative aspiration for blood, 0.5 mL of the nerve block solution was injected without difficulty or complication. The needle was removed intact. L3 Medial Branch Nerve Block (MBB): The target area for the L3 medial branch is at the junction of the postero-lateral aspect of the superior  articular process and the superior, posterior, and medial edge of the transverse process of L4. Under fluoroscopic guidance, a Quincke needle was inserted until contact was made with os over the superior postero-lateral aspect of the pedicular shadow (target area). After negative aspiration for blood, 0.5 mL of the nerve block solution was injected without difficulty or complication. The needle was removed intact. L4 Medial Branch Nerve Block (MBB): The target area for the L4 medial branch is at the junction of the postero-lateral aspect of the superior articular process and the superior, posterior, and medial edge of the transverse process of L5. Under fluoroscopic guidance, a Quincke needle was inserted until contact was made with os over the superior postero-lateral aspect of the pedicular shadow (target area). After negative aspiration for blood, 0.5 mL of the nerve block solution was injected without difficulty or complication. The needle was removed intact. L5 Medial Branch Nerve Block (MBB): The target area for the L5 medial branch is at the junction of the postero-lateral aspect of the superior articular process and the superior, posterior, and medial edge of the sacral ala. Under fluoroscopic guidance, a Quincke needle was inserted until contact was made with os over the superior postero-lateral aspect of the pedicular shadow (target area). After negative aspiration for blood, 0.5 mL of the nerve block solution was injected without difficulty or complication. The needle was removed intact. S1 Medial Branch Nerve Block (MBB): The target area for the S1 medial branch is at the posterior and inferior 6 o'clock position of the L5-S1 facet joint. Under fluoroscopic guidance, the Quincke needle inserted for the L5 MBB was redirected until contact was made with os over the inferior and postero aspect of the sacrum, at the 6 o' clock position under the L5-S1 facet joint (Target area). After negative aspiration  for blood, 0.5 mL of the nerve block solution was injected without difficulty or complication. The needle was removed intact.  Nerve block solution: 0.2% PF-Ropivacaine + Triamcinolone (40 mg/mL) diluted to a final concentration of 4 mg of Triamcinolone/mL of Ropivacaine The unused portion of the solution was discarded in the  proper designated containers. Procedural Needles: 22-gauge, 3.5-inch, Quincke needles used for all levels.  Once the entire procedure was completed, the treated area was cleaned, making sure to leave some of the prepping solution back to take advantage of its long term bactericidal properties.   Illustration of the posterior view of the lumbar spine and the posterior neural structures. Laminae of L2 through S1 are labeled. DPRL5, dorsal primary ramus of L5; DPRS1, dorsal primary ramus of S1; DPR3, dorsal primary ramus of L3; FJ, facet (zygapophyseal) joint L3-L4; I, inferior articular process of L4; LB1, lateral branch of dorsal primary ramus of L1; IAB, inferior articular branches from L3 medial branch (supplies L4-L5 facet joint); IBP, intermediate branch plexus; MB3, medial branch of dorsal primary ramus of L3; NR3, third lumbar nerve root; S, superior articular process of L5; SAB, superior articular branches from L4 (supplies L4-5 facet joint also); TP3, transverse process of L3.  Vitals:   11/06/19 1108 11/06/19 1119 11/06/19 1129 11/06/19 1141  BP: 137/67 (!) 131/58 (!) 164/78 (!) 165/87  Pulse:      Resp: 14 12 20 16   Temp:  (!) 97.5 F (36.4 C)    TempSrc:      SpO2: 100% 100% 100% 100%  Weight:      Height:         Start Time: 1103 hrs. End Time: 1108 hrs.  Imaging Guidance (Spinal):          Type of Imaging Technique: Fluoroscopy Guidance (Spinal) Indication(s): Assistance in needle guidance and placement for procedures requiring needle placement in or near specific anatomical locations not easily accessible without such assistance. Exposure Time: Please  see nurses notes. Contrast: None used. Fluoroscopic Guidance: I was personally present during the use of fluoroscopy. "Tunnel Vision Technique" used to obtain the best possible view of the target area. Parallax error corrected before commencing the procedure. "Direction-depth-direction" technique used to introduce the needle under continuous pulsed fluoroscopy. Once target was reached, antero-posterior, oblique, and lateral fluoroscopic projection used confirm needle placement in all planes. Images permanently stored in EMR. Interpretation: No contrast injected. I personally interpreted the imaging intraoperatively. Adequate needle placement confirmed in multiple planes. Permanent images saved into the patient's record.  Antibiotic Prophylaxis:   Anti-infectives (From admission, onward)   None     Indication(s): None identified  Post-operative Assessment:  Post-procedure Vital Signs:  Pulse/HCG Rate: 6668 Temp: (!) 97.5 F (36.4 C) Resp: 16 BP: (!) 165/87 SpO2: 100 %  EBL: None  Complications: No immediate post-treatment complications observed by team, or reported by patient.  Note: The patient tolerated the entire procedure well. A repeat set of vitals were taken after the procedure and the patient was kept under observation following institutional policy, for this type of procedure. Post-procedural neurological assessment was performed, showing return to baseline, prior to discharge. The patient was provided with post-procedure discharge instructions, including a section on how to identify potential problems. Should any problems arise concerning this procedure, the patient was given instructions to immediately contact us, at any time, without hesitation. In any case, we plan to contact the patient by telephone for a follow-up status report regarding this interventional procedure.  Comments:  No additional relevant information.  Plan of Care  Orders:  Orders Placed This Encounter    Procedures   L-FCT Blk (Today)    Scheduling Instructions:     Procedure: Lumbar facet block (AKA.: Lumbosacral medial branch nerve block)     Side: Right-sided     Level:  L3-4, L4-5, & L5-S1 Facets (L2, L3, L4, L5, & S1 Medial Branch Nerves)     Sedation: Patient's choice.     Timeframe: Today    Order Specific Question:   Where will this procedure be performed?    Answer:   [facility]   Fluoro (C-Arm) (<60 min) (No Report)    Intraoperative interpretation by procedural physician at [facility].    Standing Status:   Standing    Number of Occurrences:   1    Order Specific Question:   Reason for exam:    Answer:   Assistance in needle guidance and placement for procedures requiring needle placement in or near specific anatomical locations not easily accessible without such assistance.   Consent: L-FCT BLK    Nursing Order: Transcribe to consent form and obtain patient signature. Note: Always confirm laterality of pain with Wanda Martinez, before procedure. Procedure: Lumbar Facet Block  under fluoroscopic guidance Indication/Reason: Low Back Pain, with our without leg pain, due to Facet Joint Arthralgia (Joint Pain) known as Lumbar Facet Syndrome, secondary to Lumbar, and/or Lumbosacral Spondylosis (Arthritis of the Spine), without myelopathy or radiculopathy (Nerve Damage). Provider Attestation: I, Chokio Dossie Arbour, MD, (Pain Management Specialist), the physician/practitioner, attest that I have discussed with the patient the benefits, risks, side effects, alternatives, likelihood of achieving goals and potential problems during recovery for the procedure that I have provided informed consent.   Block Tray    Equipment required: Single use, disposable, "Block Tray"    Standing Status:   Standing    Number of Occurrences:   1    Order Specific Question:   Specify    Answer:   Block Tray   Chronic Opioid Analgesic:  Hydrocodone/APAP 5/325, 1 tab PO q 8 hrs (15  mg/day of hydrocodone) (has enough to last until 08/30/2019) MME/day:15mg /day.   Medications ordered for procedure: Meds ordered this encounter  Medications   lidocaine (XYLOCAINE) 2 % (with pres) injection 400 mg   lactated ringers infusion 1,000 mL   midazolam (VERSED) 5 MG/5ML injection 1-2 mg    Make sure Flumazenil is available in the pyxis when using this medication. If oversedation occurs, administer 0.2 mg IV over 15 sec. If after 45 sec no response, administer 0.2 mg again over 1 min; may repeat at 1 min intervals; not to exceed 4 doses (1 mg)   fentaNYL (SUBLIMAZE) injection 25-50 mcg    Make sure Narcan is available in the pyxis when using this medication. In the event of respiratory depression (RR< 8/min): Titrate NARCAN (naloxone) in increments of 0.1 to 0.2 mg IV at 2-3 minute intervals, until desired degree of reversal.   ropivacaine (PF) 2 mg/mL (0.2%) (NAROPIN) injection 9 mL   triamcinolone acetonide (KENALOG-40) injection 40 mg   HYDROcodone-acetaminophen (NORCO/VICODIN) 5-325 MG tablet    Sig: Take 1 tablet by mouth every 8 (eight) hours as needed for severe pain. Must last 30 days    Dispense:  90 tablet    Refill:  0    Chronic Pain: STOP Act (Not applicable) Fill 1 day early if closed on refill date. Do not fill until: 11/28/2019. To last until: 12/28/2019. Avoid benzodiazepines within 8 hours of opioids   HYDROcodone-acetaminophen (NORCO/VICODIN) 5-325 MG tablet    Sig: Take 1 tablet by mouth every 8 (eight) hours as needed for severe pain. Must last 30 days    Dispense:  90 tablet    Refill:  0    Chronic  Pain: STOP Act (Not applicable) Fill 1 day early if closed on refill date. Do not fill until: 12/28/2019. To last until: 01/27/2020. Avoid benzodiazepines within 8 hours of opioids   HYDROcodone-acetaminophen (NORCO/VICODIN) 5-325 MG tablet    Sig: Take 1 tablet by mouth every 8 (eight) hours as needed for severe pain. Must last 30 days    Dispense:  90  tablet    Refill:  0    Chronic Pain: STOP Act (Not applicable) Fill 1 day early if closed on refill date. Do not fill until: 01/27/2020. To last until: 02/26/2020. Avoid benzodiazepines within 8 hours of opioids   Medications administered: We administered lidocaine, lactated ringers, midazolam, fentaNYL, ropivacaine (PF) 2 mg/mL (0.2%), and triamcinolone acetonide.  See the medical record for exact dosing, route, and time of administration.  Follow-up plan:   Return in about 2 weeks (around 11/20/2019) for (VV), (PP).       Interventional management options:  Considering: Diagnosticbilateral cervical facet block  Possiblebilateral cervical facet RFA  Diagnostic right-sided CESI  Diagnosticintra-articular hip joint injection #2  Possible hip joint radiofrequency ablation   Palliative PRN treatment(s): Palliative bilateral trochanteric bursa injection #2  Palliative right vs bilateral lumbar facet blocks  Palliative left lumbar facet RFA #2 (last done on 02/12/2018) Palliative right lumbar facet RFA #3(last done on06/18/19) Palliative right S-I blocks  Palliative right S-I RFA #4 (last done on 06/05/2019) Palliative right suprascapular muscle TPI/MNB  Palliativeright  L3-4 LESI#3 Palliative right L5 TFESI #3     Recent Visits Date Type Provider Dept  08/20/19 Office Visit Milinda Pointer, MD [facility]  Showing recent visits within past 90 days and meeting all other requirements   Today's Visits Date Type Provider Dept  11/06/19 Procedure visit Milinda Pointer, MD [facility]  Showing today's visits and meeting all other requirements   Future Appointments Date Type Provider Dept  11/26/19 Appointment Milinda Pointer, MD [facility]  Showing future appointments within next 90 days and meeting all other requirements   Disposition: Discharge home  Discharge Date & Time: 11/06/2019; 1142 hrs.   Primary Care Physician: Einar Pheasant, MD Location: [facility] Note by: Gaspar Cola, MD Date: 11/06/2019; Time: 12:06 PM  Disclaimer:  Medicine is not an Chief Strategy Officer. The only guarantee in medicine is that nothing is guaranteed. It is important to note that the decision to proceed with this intervention was based on the information collected from the patient. The Data and conclusions were drawn from the patient's questionnaire, the interview, and the physical examination. Because the information was provided in large part by the patient, it cannot be guaranteed that it has not been purposely or unconsciously manipulated. Every effort has been made to obtain as much relevant data as possible for this evaluation. It is important to note that the conclusions that lead to this procedure are derived in large part from the available data. Always take into account that the treatment will also be dependent on availability of resources and existing treatment guidelines, considered by other Pain Management Practitioners as being common knowledge and practice, at the time of the intervention. For Medico-Legal purposes, it is also important to point out that variation in procedural techniques and pharmacological choices are the acceptable norm. The indications, contraindications, technique, and results of the above procedure should only be interpreted and judged by a Board-Certified Interventional Pain Specialist with extensive familiarity and expertise in the same exact procedure and technique.

## 2019-11-07 ENCOUNTER — Telehealth: Payer: Self-pay

## 2019-11-07 NOTE — Telephone Encounter (Signed)
Post procedure phone call.  Patinet states she is doing good.

## 2019-11-19 ENCOUNTER — Telehealth: Payer: Self-pay

## 2019-11-19 ENCOUNTER — Telehealth: Payer: Self-pay | Admitting: *Deleted

## 2019-11-19 ENCOUNTER — Other Ambulatory Visit: Payer: Self-pay | Admitting: Pain Medicine

## 2019-11-19 DIAGNOSIS — M4807 Spinal stenosis, lumbosacral region: Secondary | ICD-10-CM

## 2019-11-19 DIAGNOSIS — M48062 Spinal stenosis, lumbar region with neurogenic claudication: Secondary | ICD-10-CM

## 2019-11-19 DIAGNOSIS — G8929 Other chronic pain: Secondary | ICD-10-CM

## 2019-11-19 DIAGNOSIS — M5136 Other intervertebral disc degeneration, lumbar region: Secondary | ICD-10-CM

## 2019-11-19 DIAGNOSIS — M79604 Pain in right leg: Secondary | ICD-10-CM

## 2019-11-19 NOTE — Telephone Encounter (Signed)
Spoke with Dr Dossie Arbour.  Informed him that patient complains of pain in the low back radiating down right hip, buttock and down the side and back of right leg to foot.  States pain in the back and pain in the leg are about the same.  Pain is much worse than prior to procedure.  States procedure worked for 2-3 days.  Taking Hydrocodone for pain and states it is not helping at all.  Would like to have a MRI.  Dr Dossie Arbour notified of above and states he will order a LESI for tomorrow.  Offered this to patient and she accepted.  Pre procedure instructions given.

## 2019-11-19 NOTE — Telephone Encounter (Signed)
Pt returned call for renal u/s report. Message given to her with verbal understanding.

## 2019-11-19 NOTE — Telephone Encounter (Signed)
The last note entered by Festus Barren was entered on incorrect patient.

## 2019-11-19 NOTE — Telephone Encounter (Signed)
She is in severe pain and wants to know if Dr. Dossie Arbour can help her. She needs some help. She needs him to call in some oxycodone.

## 2019-11-19 NOTE — Progress Notes (Signed)
The patient called indicating that she is having a flareup of her right lumbar radiculopathy and that the pain seems to be unbearable.  I have ordered a repeat MRI and I will be scheduling her to come in for a lumbar epidural steroid injection/transforaminal epidural steroid injection on the right side at the L5-S1 level.  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

## 2019-11-19 NOTE — Telephone Encounter (Signed)
Returned patient's call. Another person in the home answered the phone, states [name] is not in. I told her we will call her back later.

## 2019-11-20 ENCOUNTER — Encounter: Payer: Self-pay | Admitting: Pain Medicine

## 2019-11-20 ENCOUNTER — Ambulatory Visit (HOSPITAL_BASED_OUTPATIENT_CLINIC_OR_DEPARTMENT_OTHER): Payer: Medicare Other | Admitting: Pain Medicine

## 2019-11-20 ENCOUNTER — Ambulatory Visit
Admission: RE | Admit: 2019-11-20 | Discharge: 2019-11-20 | Disposition: A | Discharge status: Home / Self Care | Payer: Medicare Other | Source: Ambulatory Visit | Attending: Pain Medicine | Admitting: Pain Medicine

## 2019-11-20 ENCOUNTER — Other Ambulatory Visit: Payer: Self-pay

## 2019-11-20 VITALS — BP 201/85 | HR 63 | Temp 97.7°F | Resp 21 | Ht 64.0 in | Wt 186.0 lb

## 2019-11-20 DIAGNOSIS — M4807 Spinal stenosis, lumbosacral region: Secondary | ICD-10-CM | POA: Insufficient documentation

## 2019-11-20 DIAGNOSIS — G8929 Other chronic pain: Secondary | ICD-10-CM

## 2019-11-20 DIAGNOSIS — M5410 Radiculopathy, site unspecified: Secondary | ICD-10-CM | POA: Diagnosis not present

## 2019-11-20 DIAGNOSIS — M79604 Pain in right leg: Secondary | ICD-10-CM | POA: Diagnosis not present

## 2019-11-20 DIAGNOSIS — M5137 Other intervertebral disc degeneration, lumbosacral region: Secondary | ICD-10-CM

## 2019-11-20 MED ORDER — FENTANYL CITRATE (PF) 100 MCG/2ML IJ SOLN
25.0000 ug | INTRAMUSCULAR | Status: DC | PRN
  Administered 2019-11-20: 11:00:00 50 ug via INTRAVENOUS
  Filled 2019-11-20: qty 2

## 2019-11-20 MED ORDER — IOHEXOL 180 MG/ML  SOLN
10.0000 mL | Freq: Once | INTRAMUSCULAR | Status: AC
  Administered 2019-11-20: 10 mL via EPIDURAL
  Filled 2019-11-20: qty 20

## 2019-11-20 MED ORDER — TRIAMCINOLONE ACETONIDE 40 MG/ML IJ SUSP
40.0000 mg | Freq: Once | INTRAMUSCULAR | Status: AC
  Administered 2019-11-20: 40 mg
  Filled 2019-11-20: qty 1

## 2019-11-20 MED ORDER — SODIUM CHLORIDE 0.9% FLUSH
2.0000 mL | Freq: Once | INTRAVENOUS | Status: AC
  Administered 2019-11-20: 11:00:00 2 mL

## 2019-11-20 MED ORDER — ROPIVACAINE HCL 2 MG/ML IJ SOLN
2.0000 mL | Freq: Once | INTRAMUSCULAR | Status: AC
  Administered 2019-11-20: 11:00:00 2 mL via EPIDURAL
  Filled 2019-11-20: qty 10

## 2019-11-20 MED ORDER — LACTATED RINGERS IV SOLN
1000.0000 mL | Freq: Once | INTRAVENOUS | Status: AC
  Administered 2019-11-20: 11:00:00 1000 mL via INTRAVENOUS

## 2019-11-20 MED ORDER — MIDAZOLAM HCL 5 MG/5ML IJ SOLN
1.0000 mg | INTRAMUSCULAR | Status: DC | PRN
  Administered 2019-11-20: 11:00:00 1 mg via INTRAVENOUS
  Filled 2019-11-20: qty 5

## 2019-11-20 MED ORDER — LIDOCAINE HCL 2 % IJ SOLN
20.0000 mL | Freq: Once | INTRAMUSCULAR | Status: AC
  Administered 2019-11-20: 11:00:00 400 mg
  Filled 2019-11-20: qty 40

## 2019-11-20 NOTE — Patient Instructions (Signed)

## 2019-11-20 NOTE — Progress Notes (Signed)
Patient's Name: Wanda Martinez  MRN: [id number]  Referring Provider: Einar Pheasant, MD  DOB: 27-Oct-1935  PCP: Einar Pheasant, MD  DOS: 11/20/2019  Note by: Gaspar Cola, MD  Service setting: Ambulatory outpatient  Specialty: Interventional Pain Management  Patient type: Established  Location: [facility] (AMB) Pain Management Facility  Visit type: Interventional Procedure   Primary Reason for Visit: Interventional Pain Management Treatment. CC: Back Pain (low)  Procedure:          Anesthesia, Analgesia, Anxiolysis:  Type: Therapeutic Inter-Laminar Epidural Steroid Injection           Region: Lumbar Level: L5-S1 Level. Laterality: Right-Sided Paramedial  Type: Moderate (Conscious) Sedation combined with Local Anesthesia Indication(s): Analgesia and Anxiety Route: Intravenous (IV) IV Access: Secured Sedation: Meaningful verbal contact was maintained at all times during the procedure  Local Anesthetic: Lidocaine 1-2%  Position: Prone with head of the table was raised to facilitate breathing.   Indications: 1. Chronic lumbar radicular pain (S1) (Right)   2. Chronic lower extremity pain (Secondary Area of Pain) (Right)   3. DDD (degenerative disc disease), lumbosacral   4. 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, L4-5, L5-S1) (Right)    Pain Score: Pre-procedure: 8 /10 Post-procedure: 0-No pain/10   Post-Procedure Evaluation  Procedure (11/06/2019): Diagnostic right-sided lumbar facet block #6 under fluoroscopic guidance and IV sedation Pre-procedure pain level: 7/10 Post-procedure: 0/10 (100% relief)  Sedation: Sedation provided.  Effectiveness during initial hour after procedure(Ultra-Short Term Relief): 100 % .  Local anesthetic used: Long-acting (4-6 hours) Effectiveness: Defined as any analgesic benefit obtained secondary to the administration of local anesthetics. This carries significant diagnostic value as to the etiological location, or anatomical origin, of the pain.  Duration of benefit is expected to coincide with the duration of the local anesthetic used.  Effectiveness during initial 4-6 hours after procedure(Short-Term Relief): 100 % .  Long-term benefit: Defined as any relief past the pharmacologic duration of the local anesthetics.  Effectiveness past the initial 6 hours after procedure(Long-Term Relief): 100 %(lasting 3 days) .  Current benefits: Defined as benefit that persist at this time.   Analgesia:  She refers that she is not sure what she did, but after the third day she started having excruciating pain, this time going down the leg more than the back.  I went ahead and reviewed the patient's previous MRIs and she has clear evidence of severe L5-S1 foraminal stenosis. Function: Somewhat improved ROM: Somewhat improved Interpretation: The patient continues to get good relief of the low back pain from blocking the lumbar facets.  This confirms that the patient continues to have problems with lumbar facet joints, as expected.  However, not long after the block, the patient appears to have done a little bit more than she should have and she ended up irritating her right L5 nerve root.  She does have a known history of foraminal stenosis which has been worsening with time.  The last time that we order an MRI was 2008 and in view of the fact that she has recently expressed concerns about her right lower extremity problems getting worse, it is definitely warranted to repeat this MRI to see how much her foraminal stenosis has progressed.  Lumbar Spine Imaging Review  Lumbosacral Imaging: Lumbar MR wo contrast:  Results for orders placed during the hospital encounter of 05/17/17  MR Lumbar Spine Wo Contrast   Narrative CLINICAL DATA:  84 year old female with lumbar back pain radiating to both buttocks, the right leg, and foot with  numbness for 3 months.  EXAM: MRI LUMBAR SPINE WITHOUT CONTRAST  TECHNIQUE: Multiplanar, multisequence MR imaging of the  lumbar spine was performed. No intravenous contrast was administered.  COMPARISON:  Lumbar MRI 04/28/2013. CT Abdomen and Pelvis 02/23/2012  FINDINGS: Segmentation: Lumbar segmentation appears to be normal and will be designated as such for this report.  Alignment: Chronic dextroconvex lumbar scoliosis. Associated straightening of lower lumbar lordosis. Stable vertebral height and alignment since 2014.  Vertebrae: Mild degenerative posterior endplate marrow edema at L3-L4 and to a lesser extent L4-L5. Chronic degenerative endplate marrow changes elsewhere including at L2-L3. Background bone marrow signal is normal. No other marrow edema or acute osseous abnormality. Negative visible sacrum and SI joints.  Conus medullaris: Extends to the L1 level and appears normal. Capacious spinal canal.  Paraspinal and other soft tissues: Visualized abdominal viscera and paraspinal soft tissues are within normal limits. Diverticulosis of the distal colon in the pelvis.  Disc levels:  T10-T11: Chronic disc space loss and mild posterior disc bulging and endplate spurring. No stenosis.  T11-T12: Chronic disc space loss mild disc bulging and facet hypertrophy. No stenosis.  T12-L1: Chronic mild disc bulge with new small central disc protrusion (series 6, image 3), but no stenosis.  L1-L2: Chronic severe disc space loss with left eccentric circumferential disc bulge and endplate spurring. No spinal or lateral recess stenosis. Stable mild left L1 foraminal stenosis.  L2-L3: Chronic severe disc space loss with vacuum disc. Right eccentric circumferential disc bulge with endplate spurring. Mild facet hypertrophy. Decreased facet joint fluid. Stable mild left L2 foraminal stenosis.  L3-L4: Progressed chronic disc space loss. Increased vacuum disc. Circumferential disc bulge and endplate spurring with broad-based posterior component. Mild to moderate facet hypertrophy. Increased mild spinal  stenosis. No convincing lateral recess stenosis. No significant foraminal stenosis.  L4-L5: Chronic severe disc space loss. Circumferential disc osteophyte complex with broad-based posterior component. Mild to moderate facet and ligament flavum hypertrophy. No spinal or lateral recess stenosis. Stable mild right greater than left L4 foraminal stenosis.  L5-S1: Chronic disc space loss and vacuum disc. Right eccentric circumferential disc osteophyte complex. Moderate right facet hypertrophy. No spinal or lateral recess stenosis. Progressed moderate to severe right L5 foraminal stenosis (series 3, image 6).  IMPRESSION: 1. Diffuse chronic disc and endplate degeneration in the setting of dextroconvex lumbar scoliosis. But underlying capacious lumbar spinal canal largely without spinal or lateral recess stenosis. 2. Mild multifactorial spinal stenosis has developed at L3-L4 since 2014. 3. Increased moderate to severe multifactorial right L5 foraminal stenosis at L5-S1. 4. Chronic mild lumbar neural foraminal stenosis elsewhere is stable.   Electronically Signed   By: Genevie Ann M.D.   On: 05/18/2017 08:29    Note: Imaging results reviewed.                   Pre-op Assessment:  Ms. Huller is a 84 y.o. (year old), female patient, seen today for interventional treatment. She  has a past surgical history that includes Breast biopsy (2013); Tonsillectomy and adenoidectomy (79); Vesicovaginal fistula closure w/ TAH (1983); Breast surgery (1986); Breast enhancement surgery (1987); Breast implant removal; Breast implant removal (Right, 08/29/2012); Mastectomy (08/2012); Abdominal hysterectomy; and Colonoscopy with propofol (N/A, 09/13/2016). Ms. Heidelberger has a current medication list which includes the following prescription(s): amlodipine, aspirin, calcium carbonate, citalopram, cyclobenzaprine, gabapentin, glucosamine sulfate, hydrochlorothiazide, hydrocodone-acetaminophen, hydrocodone-acetaminophen,  hydrocodone-acetaminophen, letrozole, losartan, lubiprostone, magnesium oxide, meloxicam, metoprolol succinate, multivitamin, nystatin, omega-3, omeprazole, pantoprazole, and pravastatin, and the following Facility-Administered Medications:  fentanyl and midazolam. Her primarily concern today is the Back Pain (low)  Initial Vital Signs:  Pulse/HCG Rate: 64ECG Heart Rate: 61 Temp: (!) 97.3 F (36.3 C) Resp: 18 BP: 120/70 SpO2: 100 %  BMI: Estimated body mass index is 31.93 kg/m as calculated from the following:   Height as of this encounter: 5\' 4"  (1.626 m).   Weight as of this encounter: 186 lb (84.4 kg).  Risk Assessment: Allergies: Reviewed. She is allergic to sulfa antibiotics and vesicare [solifenacin].  Allergy Precautions: None required Coagulopathies: Reviewed. None identified.  Blood-thinner therapy: None at this time Active Infection(s): Reviewed. None identified. Ms. Rude is afebrile  Site Confirmation: Ms. Deyoung was asked to confirm the procedure and laterality before marking the site Procedure checklist: Completed Consent: Before the procedure and under the influence of no sedative(s), amnesic(s), or anxiolytics, the patient was informed of the treatment options, risks and possible complications. To fulfill our ethical and legal obligations, as recommended by the American Medical Association's Code of Ethics, I have informed the patient of my clinical impression; the nature and purpose of the treatment or procedure; the risks, benefits, and possible complications of the intervention; the alternatives, including doing nothing; the risk(s) and benefit(s) of the alternative treatment(s) or procedure(s); and the risk(s) and benefit(s) of doing nothing. The patient was provided information about the general risks and possible complications associated with the procedure. These may include, but are not limited to: failure to achieve desired goals, infection, bleeding, organ or nerve  damage, allergic reactions, paralysis, and death. In addition, the patient was informed of those risks and complications associated to Spine-related procedures, such as failure to decrease pain; infection (i.e.: Meningitis, epidural or intraspinal abscess); bleeding (i.e.: epidural hematoma, subarachnoid hemorrhage, or any other type of intraspinal or peri-dural bleeding); organ or nerve damage (i.e.: Any type of peripheral nerve, nerve root, or spinal cord injury) with subsequent damage to sensory, motor, and/or autonomic systems, resulting in permanent pain, numbness, and/or weakness of one or several areas of the body; allergic reactions; (i.e.: anaphylactic reaction); and/or death. Furthermore, the patient was informed of those risks and complications associated with the medications. These include, but are not limited to: allergic reactions (i.e.: anaphylactic or anaphylactoid reaction(s)); adrenal axis suppression; blood sugar elevation that in diabetics may result in ketoacidosis or comma; water retention that in patients with history of congestive heart failure may result in shortness of breath, pulmonary edema, and decompensation with resultant heart failure; weight gain; swelling or edema; medication-induced neural toxicity; particulate matter embolism and blood vessel occlusion with resultant organ, and/or nervous system infarction; and/or aseptic necrosis of one or more joints. Finally, the patient was informed that Medicine is not an exact science; therefore, there is also the possibility of unforeseen or unpredictable risks and/or possible complications that may result in a catastrophic outcome. The patient indicated having understood very clearly. We have given the patient no guarantees and we have made no promises. Enough time was given to the patient to ask questions, all of which were answered to the patient's satisfaction. Ms. Devenney has indicated that she wanted to continue with the  procedure. Attestation: I, the ordering provider, attest that I have discussed with the patient the benefits, risks, side-effects, alternatives, likelihood of achieving goals, and potential problems during recovery for the procedure that I have provided informed consent. Date  Time: 11/20/2019 10:02 AM  Pre-Procedure Preparation:  Monitoring: As per clinic protocol. Respiration, ETCO2, SpO2, BP, heart rate and rhythm monitor placed and  checked for adequate function Safety Precautions: Patient was assessed for positional comfort and pressure points before starting the procedure. Time-out: I initiated and conducted the "Time-out" before starting the procedure, as per protocol. The patient was asked to participate by confirming the accuracy of the "Time Out" information. Verification of the correct person, site, and procedure were performed and confirmed by me, the nursing staff, and the patient. "Time-out" conducted as per Joint Commission's Universal Protocol (UP.01.01.01). Time: 1117  Description of Procedure:          Target Area: The interlaminar space, initially targeting the lower laminar border of the superior vertebral body. Approach: Paramedial approach. Area Prepped: Entire Posterior Lumbar Region Prepping solution: DuraPrep (Iodine Povacrylex [0.7% available iodine] and Isopropyl Alcohol, 74% w/w) Safety Precautions: Aspiration looking for blood return was conducted prior to all injections. At no point did we inject any substances, as a needle was being advanced. No attempts were made at seeking any paresthesias. Safe injection practices and needle disposal techniques used. Medications properly checked for expiration dates. SDV (single dose vial) medications used. Description of the Procedure: Protocol guidelines were followed. The procedure needle was introduced through the skin, ipsilateral to the reported pain, and advanced to the target area. Bone was contacted and the needle walked  caudad, until the lamina was cleared. The epidural space was identified using "loss-of-resistance technique" with 2-3 ml of PF-NaCl (0.9% NSS), in a 5cc LOR glass syringe.  Vitals:   11/20/19 1125 11/20/19 1132 11/20/19 1142 11/20/19 1154  BP: (!) 170/85 (!) 173/81 (!) 187/81 (!) 201/85  Pulse:      Resp: 12 13 11  (!) 21  Temp:  97.9 F (36.6 C)  97.7 F (36.5 C)  TempSrc:  Temporal  Temporal  SpO2: 100% 96% 98% 97%  Weight:      Height:        Start Time: 1117 hrs. End Time: 1122 hrs.  Materials:  Needle(s) Type: Epidural needle Gauge: 17G Length: 3.5-in Medication(s): Please see orders for medications and dosing details.  Imaging Guidance (Spinal):          Type of Imaging Technique: Fluoroscopy Guidance (Spinal) Indication(s): Assistance in needle guidance and placement for procedures requiring needle placement in or near specific anatomical locations not easily accessible without such assistance. Exposure Time: Please see nurses notes. Contrast: Before injecting any contrast, we confirmed that the patient did not have an allergy to iodine, shellfish, or radiological contrast. Once satisfactory needle placement was completed at the desired level, radiological contrast was injected. Contrast injected under live fluoroscopy. No contrast complications. See chart for type and volume of contrast used. Fluoroscopic Guidance: I was personally present during the use of fluoroscopy. "Tunnel Vision Technique" used to obtain the best possible view of the target area. Parallax error corrected before commencing the procedure. "Direction-depth-direction" technique used to introduce the needle under continuous pulsed fluoroscopy. Once target was reached, antero-posterior, oblique, and lateral fluoroscopic projection used confirm needle placement in all planes. Images permanently stored in EMR. Interpretation: I personally interpreted the imaging intraoperatively. Adequate needle placement  confirmed in multiple planes. Appropriate spread of contrast into desired area was observed. No evidence of afferent or efferent intravascular uptake. No intrathecal or subarachnoid spread observed. Permanent images saved into the patient's record.  Antibiotic Prophylaxis:   Anti-infectives (From admission, onward)   None     Indication(s): None identified  Post-operative Assessment:  Post-procedure Vital Signs:  Pulse/HCG Rate: 63(!) 58 Temp: 97.7 F (36.5 C) Resp: (!) 21  BP: (!) 201/85(MD notiffied. in to assess patient. ) SpO2: 97 %  EBL: None  Complications: No immediate post-treatment complications observed by team, or reported by patient.  Note: The patient tolerated the entire procedure well. A repeat set of vitals were taken after the procedure and the patient was kept under observation following institutional policy, for this type of procedure. Post-procedural neurological assessment was performed, showing return to baseline, prior to discharge. The patient was provided with post-procedure discharge instructions, including a section on how to identify potential problems. Should any problems arise concerning this procedure, the patient was given instructions to immediately contact us, at any time, without hesitation. In any case, we plan to contact the patient by telephone for a follow-up status report regarding this interventional procedure.  Comments:  No additional relevant information.  Plan of Care  Orders:  Orders Placed This Encounter  Procedures  . LESI (Today)    Scheduling Instructions:     Procedure: Interlaminar LESI L5-S1     Laterality: Right-sided     Sedation: Patient's choice     Timeframe:  Today    Order Specific Question:   Where will this procedure be performed?    Answer:   [facility]  . Fluoro (C-Arm) (<60 min) (No Report)    Intraoperative interpretation by procedural physician at [place].    Standing Status:   Standing     Number of Occurrences:   1    Order Specific Question:   Reason for exam:    Answer:   Assistance in needle guidance and placement for procedures requiring needle placement in or near specific anatomical locations not easily accessible without such assistance.  . Consent: LESI    Provider Attestation: I, [place] Dossie Arbour, MD, (Pain Management Specialist), the physician/practitioner, attest that I have discussed with the patient the benefits, risks, side effects, alternatives, likelihood of achieving goals and potential problems during recovery for the procedure that I have provided informed consent.    Scheduling Instructions:     Procedure: Lumbar epidural steroid injection under fluoroscopic guidance     Indications: Low back and/or lower extremity pain secondary to lumbar radiculitis     Note: Always confirm laterality of pain with Ms. Ermalinda Barrios, before procedure.     Transcribe to consent form and obtain patient signature.  Marland Kitchen Epidural Tray    Equipment required: Single use, disposable, "Epidural Tray" Epidural Catheter: NOT required    Standing Status:   Standing    Number of Occurrences:   1    Order Specific Question:   Specify    Answer:   Epidural Tray   Chronic Opioid Analgesic:  Hydrocodone/APAP 5/325, 1 tab PO q 8 hrs (15 mg/day of hydrocodone) (has enough to last until 08/30/2019) MME/day:15mg /day.   Medications ordered for procedure: Meds ordered this encounter  Medications  . iohexol (OMNIPAQUE) 180 MG/ML injection 10 mL    Must be Myelogram-compatible. If not available, you may substitute with a water-soluble, non-ionic, hypoallergenic, myelogram-compatible radiological contrast medium.  Marland Kitchen lidocaine (XYLOCAINE) 2 % (with pres) injection 400 mg  . lactated ringers infusion 1,000 mL  . midazolam (VERSED) 5 MG/5ML injection 1-2 mg    Make sure Flumazenil is available in the pyxis when using this medication. If oversedation occurs, administer 0.2 mg IV over 15 sec. If  after 45 sec no response, administer 0.2 mg again over 1 min; may repeat at 1 min intervals; not to exceed 4 doses (1 mg)  .  fentaNYL (SUBLIMAZE) injection 25-50 mcg    Make sure Narcan is available in the pyxis when using this medication. In the event of respiratory depression (RR< 8/min): Titrate NARCAN (naloxone) in increments of 0.1 to 0.2 mg IV at 2-3 minute intervals, until desired degree of reversal.  . sodium chloride flush (NS) 0.9 % injection 2 mL  . ropivacaine (PF) 2 mg/mL (0.2%) (NAROPIN) injection 2 mL  . triamcinolone acetonide (KENALOG-40) injection 40 mg   Medications administered: We administered iohexol, lidocaine, lactated ringers, midazolam, fentaNYL, sodium chloride flush, ropivacaine (PF) 2 mg/mL (0.2%), and triamcinolone acetonide.  See the medical record for exact dosing, route, and time of administration.  Follow-up plan:   Return in about 2 weeks (around 12/04/2019) for (VV), (PP).  Lumbar MRI ordered.     Interventional treatment options:  Under consideration: Diagnosticbilateral cervical facet block  Possiblebilateral cervical facet RFA    Therapeutic/palliative (PRN): Diagnostic right CESI #2  Palliative bilateral trochanteric bursa injection #2  Diagnosticright IA hip joint injection #2  Palliative right lumbar facet block #7  Palliative left lumbar facet block #4  Palliative right lumbar facet RFA #3(last done06/18/19) Palliative left lumbar facet RFA #2 (last done 02/12/2018) Diagnostic/Therapeutic right SI block #2  Diagnostic/Therapeutic left SI block #2  Palliative right S-I RFA #4 (last done 06/05/2019) Palliative right suprascapular muscle TPI/MNB #3  Palliativeright L3-4 LESI#3 Palliative right L5 TFESI #3 Diagnostic right L5-S1 LESI #2     Recent Visits Date Type Provider Dept  11/06/19 Procedure visit Milinda Pointer, MD [facility]  Showing recent visits within past 90 days and meeting all other requirements    Today's Visits Date Type Provider Dept  11/20/19 Procedure visit Milinda Pointer, MD [facility]  Showing today's visits and meeting all other requirements   Future Appointments Date Type Provider Dept  11/26/19 Appointment Milinda Pointer, MD [facility]  12/04/19 Appointment Milinda Pointer, MD [facility]  Showing future appointments within next 90 days and meeting all other requirements   Disposition: Discharge home  Discharge Date & Time: 11/20/2019; 1204 hrs.   Primary Care Physician: Einar Pheasant, MD Location: [facility] Note by: Gaspar Cola, MD Date: 11/20/2019; Time: 1:38 PM  Disclaimer:  Medicine is not an Chief Strategy Officer. The only guarantee in medicine is that nothing is guaranteed. It is important to note that the decision to proceed with this intervention was based on the information collected from the patient. The Data and conclusions were drawn from the patient's questionnaire, the interview, and the physical examination. Because the information was provided in large part by the patient, it cannot be guaranteed that it has not been purposely or unconsciously manipulated. Every effort has been made to obtain as much relevant data as possible for this evaluation. It is important to note that the conclusions that lead to this procedure are derived in large part from the available data. Always take into account that the treatment will also be dependent on availability of resources and existing treatment guidelines, considered by other Pain Management Practitioners as being common knowledge and practice, at the time of the intervention. For Medico-Legal purposes, it is also important to point out that variation in procedural techniques and pharmacological choices are the acceptable norm. The indications, contraindications, technique, and results of the above procedure should only be interpreted and judged by  a Board-Certified Interventional Pain Specialist with extensive familiarity and expertise in the same exact procedure and technique.

## 2019-11-20 NOTE — Progress Notes (Signed)
Safety precautions to be maintained throughout the outpatient stay will include: orient to surroundings, keep bed in low position, maintain call bell within reach at all times, provide assistance with transfer out of bed and ambulation.  

## 2019-11-21 ENCOUNTER — Telehealth: Payer: Self-pay

## 2019-11-21 NOTE — Telephone Encounter (Signed)
Denies any needs at this time. Instructed to call us back if needed. Questioning about MRi

## 2019-11-24 ENCOUNTER — Telehealth: Payer: Self-pay | Admitting: *Deleted

## 2019-11-25 NOTE — Progress Notes (Signed)
Pain Management Virtual Encounter Note - Virtual Visit via Telephone Telehealth (real-time audio visits between healthcare provider and patient).   Patient's Phone No. & Preferred Pharmacy:  [phone] (home); [phone] (mobile); (Preferred) [phone] No e-mail address on record  [place], [place] - [address] [place] [address] Phone: [phone] Fax: [phone]    Pre-screening note:  Our staff contacted Ms. Stinner and offered her an "in person", "face-to-face" appointment versus a telephone encounter. She indicated preferring the telephone encounter, at this time.   Reason for Virtual Visit: COVID-19*  Social distancing based on CDC and AMA recommendations.   I contacted [name] on 11/26/2019 via telephone.      I clearly identified myself as Gaspar Cola, MD. I verified that I was speaking with the correct person using two identifiers (Name: Julye Gorra, and date of birth: 02-24-1935).  Advanced Informed Consent I sought verbal advanced consent from [name] for virtual visit interactions. I informed Ms. Orchard of possible security and privacy concerns, risks, and limitations associated with providing "not-in-person" medical evaluation and management services. I also informed Ms. Charette of the availability of "in-person" appointments. Finally, I informed her that there would be a charge for the virtual visit and that she could be  personally, fully or partially, financially responsible for it. Ms. Semmens expressed understanding and agreed to proceed.   Historic Elements   Ms. Tere Anias Cutchin is a 84 y.o. year old, female patient evaluated today after her last encounter by our practice on 11/24/2019. Ms. Mcfadyen  has a past medical history of Acute postoperative pain (10/25/2017), Anemia, Arm pain (07/26/2015), Arthritis, Arthritis, degenerative (03/26/2014), Back pain (11/01/2013), Bladder  infection (06/2018), Breast cancer ([place]), Breast cancer ([place]), CHEST PAIN (04/29/2010), Chronic cystitis, Cystocele (02/22/2013), Cystocele, midline (08/19/2013), Degeneration of intervertebral disc of lumbosacral region (03/26/2014), DYSPNEA (04/29/2010), Enthesopathy of hip (03/26/2014), GERD (gastroesophageal reflux disease), Hiatal hernia, HTN (hypertension), Hypokalemia (06/2018), Hyponatremia (06/2018), LBP (low back pain) (03/26/2014), Neck pain (11/01/2013), Parkinson disease ([place]), Pneumonia (06/2018), Sinusitis (02/07/2015), Skin lesions (07/12/2014), and Urinary incontinence. She also  has a past surgical history that includes Breast biopsy (2013); Tonsillectomy and adenoidectomy (79); Vesicovaginal fistula closure w/ TAH (1983); Breast surgery (1986); Breast enhancement surgery (1987); Breast implant removal; Breast implant removal (Right, 08/29/2012); Mastectomy (08/2012); Abdominal hysterectomy; and Colonoscopy with propofol (N/A, 09/13/2016). Ms. Indovina has a current medication list which includes the following prescription(s): amlodipine, aspirin, calcium carbonate, citalopram, cyclobenzaprine, gabapentin, glucosamine sulfate, hydrochlorothiazide, hydrocodone-acetaminophen, hydrocodone-acetaminophen, hydrocodone-acetaminophen, letrozole, losartan, lubiprostone, magnesium oxide, meloxicam, metoprolol succinate, multivitamin, nystatin, omega-3, omeprazole, pantoprazole, and pravastatin. She  reports that she has never smoked. She has never used smokeless tobacco. She reports that she does not drink alcohol or use drugs. Ms. Hollars is allergic to sulfa antibiotics and vesicare [solifenacin].   HPI  Today, she is being contacted for both, medication management and a post-procedure assessment.  On the patient's last visit, she mentioned that she was interested in perhaps changing her pain medication.  In the past, she has taken oxycodone after radiofrequency ablations, without any problems.  Since on 11/20/2019 the  patient asked to get a prescription for oxycodone in addition to the 1 for hydrocodone, I was under the impression that she wanted to perhaps switch to the oxycodone since she described this is working better than the hydrocodone.  However, today when I asked if she wanted to do that today, she indicated that she would not mind having  some oxycodones available to use whenever she needed it, in addition to the hydrocodone.  I immediately clarified to the patient that the only time that I will makes to short acting analgesics is in the event that I am doing a radiofrequency ablation and I only do that because I know that the insurance companies will typically deny another prescription of the same thing that the patient normally takes because they interpret that as getting an early refill.  I explained this to the patient and I informed her that I would not be writing for 2 short acting's at the same time.  However I was willing to switch her to the oxycodone if that is what she wanted.  Surprisingly, she said that she did not want to and when I asked her, apparently she is under the impression that the oxycodone is more addictive.  I told her that addiction is a psychological disorder and therefore both of them would be addictive, if this is the type of personality that the patient has.  Despite the fact that I clarify all this for her, she decided that she wanted to stay under hydrocodone.  Because she did obtain such good relief with the right sided L5-S1 LESI #1, I have offer her to repeated to see if we can get some additional relief.  She describes the leg pain as being almost completely gone, but right now she still experiences some of the back pain.  I believe that this may be secondary to the foraminal stenosis and the fact that it may be compressing the area of the medial branch of L5 and therefore giving pain not only to the lower extremity but the back as well.  In view of this, I have offered the patient  to do a combination of a right-sided L5-S1 LESI + a right-sided L5 transforaminal epidural steroid injection.  She indicated being interested in this and therefore we will go ahead and put her on the schedule to do that, as soon as possible.  She has again requested to have it done with some sedation.  Post-Procedure Evaluation  Procedure (11/20/2019): Diagnostic/therapeutic right-sided L5-S1 LESI #1 under fluoroscopic guidance and IV sedation Pre-procedure pain level:  8/10 Post-procedure: 0/10 (100% relief)  Sedation: Sedation provided.  Chauncey Fischer, RN  11/26/2019  9:06 AM  Sign when Signing Visit Pain relief after procedure (treated area only): (Questions asked to patient) 1. Starting about 15 minutes after the procedure, and "while the area was still numb" (from the local anesthetics), were you having any of your usual pain "in that area" (the treated area)?  (NOTE: NOT including the discomfort from the needle sticks.) First 1 hour: 100 % better. First 4-6 hours: 100 % better. 2. Assuming that it did get numb. How long was the area numb? 100 % benefit, longer than 6 hours. How long? 3 days 3. How much better is your pain now, when compared to before the procedure? Current benefit: 50 % better. 4. Can you move better now? Improvement in ROM (Range of Motion): Yes. 5. Can you do more now? Improvement in function: Yes. 4. Did you have any problems with the procedure? Side-effects/Complications: No.   Current benefits: Defined as benefit that persist at this time.   Analgesia:  50% improved Function: Somewhat improved ROM: Somewhat improved  Pharmacotherapy Assessment  Analgesic: Hydrocodone/APAP 5/325, 1 tab PO q 8 hrs (15 mg/day of hydrocodone) MME/day:15mg /day.   Monitoring: Pharmacotherapy: No side-effects or adverse reactions reported. [place] PMP:  PDMP reviewed during this encounter.       Compliance: No problems identified. Effectiveness: Clinically acceptable. Plan:  Refer to "POC".  UDS:  Summary  Date Value Ref Range Status  01/08/2019 FINAL  Final    Comment:    ==================================================================== TOXASSURE SELECT 13 (MW) ==================================================================== Test                             Result       Flag       Units Drug Present and Declared for Prescription Verification   Hydrocodone                    1944         EXPECTED   ng/mg creat   Dihydrocodeine                 194          EXPECTED   ng/mg creat   Norhydrocodone                 2532         EXPECTED   ng/mg creat    Sources of hydrocodone include scheduled prescription    medications. Dihydrocodeine and norhydrocodone are expected    metabolites of hydrocodone. Dihydrocodeine is also available as a    scheduled prescription medication. ==================================================================== Test                      Result    Flag   Units      Ref Range   Creatinine              188              mg/dL      >=20 ==================================================================== Declared Medications:  The flagging and interpretation on this report are based on the  following declared medications.  Unexpected results may arise from  inaccuracies in the declared medications.  **Note: The testing scope of this panel includes these medications:  Hydrocodone (Hydrocodone-Acetaminophen)  **Note: The testing scope of this panel does not include following  reported medications:  Acetaminophen (Hydrocodone-Acetaminophen)  Acyclovir  Amlodipine Besylate  Aspirin  Calcium carbonate  Citalopram  Cyclobenzaprine  Gabapentin  Glucosamine  Hydrochlorothiazide  Letrozole  Losartan (Losartan Potassium)  Lubiprostone  Magnesium Oxide  Meloxicam  Metoprolol  Multivitamin  Omega-3 Fatty Acids  Omeprazole  Pantoprazole  Pravastatin ==================================================================== For  clinical consultation, please call [phone]. ====================================================================    Laboratory Chemistry Profile (12 mo)  Renal: 01/08/2019: BUN/Creatinine Ratio 12 10/17/2019: BUN 25; Creatinine, Ser 1.13  Lab Results  Component Value Date   GFR 46.10 (L) 10/17/2019   GFRAA 37 (L) 07/24/2019   GFRNONAA 32 (L) 07/24/2019   Hepatic: 10/17/2019: Albumin 4.4 Lab Results  Component Value Date   AST 28 10/17/2019   ALT 20 10/17/2019   Other: No results found for requested labs within last 8760 hours. Note: Above Lab results reviewed.  Imaging  Fluoro (C-Arm) (<60 min) (No Report) Fluoro was used, but no Radiologist interpretation will be provided.  Please refer to "NOTES" tab for provider progress note.   Assessment  The primary encounter diagnosis was Chronic lumbar radicular pain (S1) (Right). 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  Plan of Care  Problem-specific:  No problem-specific Assessment & Plan notes found for this encounter.  I am having Marthann T. Magalhaes maintain her aspirin, calcium carbonate, multivitamin, lubiprostone, GLUCOSAMINE SULFATE PO, Omega-3, citalopram, omeprazole, gabapentin, letrozole, hydrochlorothiazide, metoprolol succinate, cyclobenzaprine, meloxicam, magnesium oxide, pantoprazole, amLODipine, losartan, pravastatin, nystatin, HYDROcodone-acetaminophen, HYDROcodone-acetaminophen, and HYDROcodone-acetaminophen.  Pharmacotherapy (Medications Ordered): No orders of the defined types were placed in this encounter.  Orders:  Orders Placed This Encounter  Procedures  . LESI (Schedule)    Standing Status:   Future    Standing Expiration Date:   12/27/2019    Scheduling Instructions:      Procedure: Interlaminar Lumbar Epidural Steroid injection (LESI)  L5-S1     Laterality: Right-sided     Sedation: Patient's choice.     Timeframe: ASAA    Order Specific Question:   Where will this procedure be performed?    Answer:   [facility]  . Lumbar Trans-Foraminal (Schedule)    Standing Status:   Future    Standing Expiration Date:   12/27/2019    Scheduling Instructions:     Side: Right-sided     Level: L5     Sedation: Patient's choice.     Timeframe: ASAP    Order Specific Question:   Where will this procedure be performed?    Answer:   [facility]   Follow-up plan:   Return in about 13 weeks (around 02/25/2020) for (VV), (MM), in addition, Procedure (w/ sedation): (R) L5 TFESI #1 + (R) L5-S1 LESI #2, (ASAP).      Interventional treatment options:  Under consideration: Diagnosticbilateral cervical facet block  Possiblebilateral cervical facet RFA    Therapeutic/palliative (PRN): Diagnostic right CESI #2  Palliative bilateral trochanteric bursa injection #2  Diagnosticright IA hip joint injection #2  Palliative right lumbar facet block #7  Palliative left lumbar facet block #4  Palliative right lumbar facet RFA #3(last done06/18/19) Palliative left lumbar facet RFA #2 (last done 02/12/2018) Diagnostic/Therapeutic right SI block #2  Diagnostic/Therapeutic left SI block #2  Palliative right S-I RFA #4 (last done 06/05/2019) Palliative right suprascapular muscle TPI/MNB #3  Palliativeright L3-4 LESI#3 Palliative right L5 TFESI #3 Diagnostic right L5-S1 LESI #2     Recent Visits Date Type Provider Dept  11/20/19 Procedure visit Milinda Pointer, MD [facility]  11/06/19 Procedure visit Milinda Pointer, MD [facility]  Showing recent visits within past 90 days and meeting all other requirements   Today's Visits Date Type Provider Dept  11/26/19 Telemedicine Milinda Pointer, MD [facility]   Showing today's visits and meeting all other requirements   Future Appointments Date Type Provider Dept  12/04/19 Appointment Milinda Pointer, MD [facility]  Showing future appointments within next 90 days and meeting all other requirements   I discussed the assessment and treatment plan with the patient. The patient was provided an opportunity to ask questions and all were answered. The patient agreed with the plan and demonstrated an understanding of the instructions.  Patient advised to call back or seek an in-person evaluation if the symptoms or condition worsens.  Total duration of non-face-to-face encounter: 18 minutes.  Note by: Gaspar Cola, MD Date: 11/26/2019; Time: 1:26 PM  Note: This dictation was prepared with Dragon dictation. Any transcriptional errors that may result from this process are unintentional.  Disclaimer:  * Given the special circumstances of the COVID-19 pandemic, the federal government has announced that the Office for [employer]  Rights (OCR) will exercise its enforcement discretion and will not impose penalties on physicians using telehealth in the event of noncompliance with regulatory requirements under the [place] and Accountability Act (HIPAA) in connection with the good faith provision of telehealth during the UKRCV-81 national public health emergency. ([place])

## 2019-11-26 ENCOUNTER — Ambulatory Visit: Payer: Medicare Other | Attending: Pain Medicine | Admitting: Pain Medicine

## 2019-11-26 ENCOUNTER — Other Ambulatory Visit: Payer: Self-pay

## 2019-11-26 ENCOUNTER — Encounter: Payer: Self-pay | Admitting: Pain Medicine

## 2019-11-26 DIAGNOSIS — G8929 Other chronic pain: Secondary | ICD-10-CM

## 2019-11-26 DIAGNOSIS — M79604 Pain in right leg: Secondary | ICD-10-CM | POA: Diagnosis not present

## 2019-11-26 DIAGNOSIS — M51379 Other intervertebral disc degeneration, lumbosacral region without mention of lumbar back pain or lower extremity pain: Secondary | ICD-10-CM

## 2019-11-26 DIAGNOSIS — M5137 Other intervertebral disc degeneration, lumbosacral region: Secondary | ICD-10-CM

## 2019-11-26 DIAGNOSIS — M5410 Radiculopathy, site unspecified: Secondary | ICD-10-CM | POA: Diagnosis not present

## 2019-11-26 DIAGNOSIS — M4807 Spinal stenosis, lumbosacral region: Secondary | ICD-10-CM | POA: Diagnosis not present

## 2019-11-26 DIAGNOSIS — M48062 Spinal stenosis, lumbar region with neurogenic claudication: Secondary | ICD-10-CM

## 2019-11-26 NOTE — Progress Notes (Signed)
Pain relief after procedure (treated area only): (Questions asked to patient) 1. Starting about 15 minutes after the procedure, and "while the area was still numb" (from the local anesthetics), were you having any of your usual pain "in that area" (the treated area)?  (NOTE: NOT including the discomfort from the needle sticks.) First 1 hour: 100 % better. First 4-6 hours: 100 % better. 2. Assuming that it did get numb. How long was the area numb? 100 % benefit, longer than 6 hours. How long? 3 days 3. How much better is your pain now, when compared to before the procedure? Current benefit: 50 % better. 4. Can you move better now? Improvement in ROM (Range of Motion): Yes. 5. Can you do more now? Improvement in function: Yes. 4. Did you have any problems with the procedure? Side-effects/Complications: No.

## 2019-11-27 ENCOUNTER — Ambulatory Visit (INDEPENDENT_AMBULATORY_CARE_PROVIDER_SITE_OTHER): Payer: Medicare Other | Admitting: Internal Medicine

## 2019-11-27 ENCOUNTER — Encounter: Payer: Self-pay | Admitting: Internal Medicine

## 2019-11-27 ENCOUNTER — Other Ambulatory Visit: Payer: Self-pay

## 2019-11-27 DIAGNOSIS — E871 Hypo-osmolality and hyponatremia: Secondary | ICD-10-CM

## 2019-11-27 DIAGNOSIS — N1831 Chronic kidney disease, stage 3a: Secondary | ICD-10-CM

## 2019-11-27 DIAGNOSIS — G20 Parkinson's disease: Secondary | ICD-10-CM | POA: Diagnosis not present

## 2019-11-27 DIAGNOSIS — K219 Gastro-esophageal reflux disease without esophagitis: Secondary | ICD-10-CM

## 2019-11-27 DIAGNOSIS — G20A1 Parkinson's disease without dyskinesia, without mention of fluctuations: Secondary | ICD-10-CM

## 2019-11-27 DIAGNOSIS — E7800 Pure hypercholesterolemia, unspecified: Secondary | ICD-10-CM | POA: Diagnosis not present

## 2019-11-27 DIAGNOSIS — M545 Low back pain, unspecified: Secondary | ICD-10-CM

## 2019-11-27 DIAGNOSIS — R0602 Shortness of breath: Secondary | ICD-10-CM

## 2019-11-27 DIAGNOSIS — K5909 Other constipation: Secondary | ICD-10-CM | POA: Diagnosis not present

## 2019-11-27 DIAGNOSIS — C50911 Malignant neoplasm of unspecified site of right female breast: Secondary | ICD-10-CM | POA: Diagnosis not present

## 2019-11-27 DIAGNOSIS — D649 Anemia, unspecified: Secondary | ICD-10-CM | POA: Diagnosis not present

## 2019-11-27 DIAGNOSIS — F320 Major depressive disorder, single episode, mild: Secondary | ICD-10-CM | POA: Diagnosis not present

## 2019-11-27 DIAGNOSIS — I10 Essential (primary) hypertension: Secondary | ICD-10-CM

## 2019-11-27 DIAGNOSIS — G8929 Other chronic pain: Secondary | ICD-10-CM

## 2019-11-27 DIAGNOSIS — F32A Depression, unspecified: Secondary | ICD-10-CM

## 2019-11-27 NOTE — Assessment & Plan Note (Signed)
GFR improved last visit - 54.  Stay hydrated.  Follow metabolic panel.

## 2019-11-27 NOTE — Assessment & Plan Note (Signed)
Blood pressure under good control.  Continue same medication regimen.  Follow pressures.  Follow metabolic panel.   

## 2019-11-27 NOTE — Progress Notes (Signed)
Patient ID: Wanda Martinez, female   DOB: [birth year], 84 y.o.   MRN: [id number]   Virtual Visit via telephone Note This visit occurred during the SARS-CoV-2 public health emergency.  Safety protocols were in place, including screening questions prior to the visit, additional usage of staff PPE, and extensive cleaning of exam room while observing appropriate contact time as indicated for disinfecting solutions.  This visit type was conducted due to national recommendations for restrictions regarding the COVID-19 pandemic (e.g. social distancing).  This format is felt to be most appropriate for this patient at this time.  All issues noted in this document were discussed and addressed.  No physical exam was performed (except for noted visual exam findings with Video Visits).   I connected with [name] by telephone and verified that I am speaking with the correct person using two identifiers. Location patient: home Location provider: work  Persons participating in the telephone visit: patient, provider  I discussed the limitations, risks, security and privacy concerns of performing an evaluation and management service by telephone and the availability of in person appointments. The patient expressed understanding and agreed to proceed.   Reason for visit: scheduled follow up.   HPI: She reports she is doing relatively well except for increased back pain.  She is followed at pain clinic.  Planning for another epidural.  She saw cardiology 11/04/19 - recommended stress myoview, holter and carotid ultrasound.  These have been postponed due to covid restrictions.  Plans to reschedule.  Feels breathing and heart - stable.  Previous visit - discussed her sob.  cxr with bilateral interstitial prominence felt to be most likely related to chronic interstitial lung disease.  Discussed pulmonary referral.  She is agreeable.  No acid reflux. No regurgitation. Does notice some coating on her teeth -  occasionally.  No abdominal pain.  Bowels moving with monitoring diet and occasional colon cleanse.  She tries to stay active around her house. Some burning on her tongue. Is some better now.  Overall feels things are stable.    ROS: See pertinent positives and negatives per HPI.  Past Medical History:  Diagnosis Date  . Acute postoperative pain 10/25/2017  . Anemia   . Arm pain 07/26/2015  . Arthritis   . Arthritis, degenerative 03/26/2014  . Back pain 11/01/2013  . Bladder infection 06/2018  . Breast cancer ([place])    Masectomy - left - 1986   . Breast cancer [facility])    Mastectomy-right -2014  . CHEST PAIN 04/29/2010   Qualifier: Diagnosis of  By: Wynetta Emery RN, Doroteo Bradford    . Chronic cystitis   . Cystocele 02/22/2013  . Cystocele, midline 08/19/2013  . Degeneration of intervertebral disc of lumbosacral region 03/26/2014  . DYSPNEA 04/29/2010   Qualifier: Diagnosis of  By: Wynetta Emery RN, Doroteo Bradford    . Enthesopathy of hip 03/26/2014  . GERD (gastroesophageal reflux disease)   . Hiatal hernia   . HTN (hypertension)   . Hypokalemia 06/2018  . Hyponatremia 06/2018  . LBP (low back pain) 03/26/2014  . Neck pain 11/01/2013  . Parkinson disease ([place])   . Pneumonia 06/2018  . Sinusitis 02/07/2015  . Skin lesions 07/12/2014  . Urinary incontinence    mixed     Past Surgical History:  Procedure Laterality Date  . ABDOMINAL HYSTERECTOMY    . BREAST BIOPSY  2013  . BREAST ENHANCEMENT SURGERY  1987  . BREAST IMPLANT REMOVAL    . BREAST IMPLANT REMOVAL Right 08/29/2012  .  BREAST SURGERY  1986   s/p left mastectomy  . COLONOSCOPY WITH PROPOFOL N/A 09/13/2016   Procedure: COLONOSCOPY WITH PROPOFOL;  Surgeon: Manya Silvas, MD;  Location: [facility] ENDOSCOPY;  Service: Endoscopy;  Laterality: N/A;  . MASTECTOMY  08/2012   right  . TONSILLECTOMY AND ADENOIDECTOMY  79  . VESICOVAGINAL FISTULA CLOSURE W/ TAH  1983    Family History  Problem Relation Age of Onset  . Diabetes Father   . Stroke Father   . Colon  polyps Father   . Stroke Mother   . Parkinson's disease Mother     SOCIAL HX: reviewed   Current Outpatient Medications:  .  amLODipine (NORVASC) 2.5 MG tablet, TAKE 1 TABLET BY MOUTH ONCE DAILY., Disp: 90 tablet, Rfl: 3 .  aspirin 81 MG EC tablet, Take 81 mg by mouth daily as needed.  , Disp: , Rfl:  .  Calcium Carbonate (CALCIUM 600) 1500 MG TABS, Take 1 tablet by mouth daily.  , Disp: , Rfl:  .  citalopram (CELEXA) 20 MG tablet, TAKE 1 & 1/2 TABLETS BY MOUTH ONCE DAILY, Disp: , Rfl:  .  cyclobenzaprine (FLEXERIL) 10 MG tablet, TAKE ONE TABLET BY MOUTH AT BEDTIME., Disp: 30 tablet, Rfl: 11 .  gabapentin (NEURONTIN) 100 MG capsule, Take 200 mg by mouth 3 (three) times daily. , Disp: , Rfl: 1 .  GLUCOSAMINE SULFATE PO, Take by mouth daily. , Disp: , Rfl:  .  hydrochlorothiazide (HYDRODIURIL) 25 MG tablet, TAKE 1 TABLET BY MOUTH ONCE DAILY. (PATIENT TAKES 1/2 ONCE DAILY AND EXTRA 1/2 IF NEEDED), Disp: 15 tablet, Rfl: 10 .  [START ON 11/28/2019] HYDROcodone-acetaminophen (NORCO/VICODIN) 5-325 MG tablet, Take 1 tablet by mouth every 8 (eight) hours as needed for severe pain. Must last 30 days, Disp: 90 tablet, Rfl: 0 .  [START ON 12/28/2019] HYDROcodone-acetaminophen (NORCO/VICODIN) 5-325 MG tablet, Take 1 tablet by mouth every 8 (eight) hours as needed for severe pain. Must last 30 days, Disp: 90 tablet, Rfl: 0 .  [START ON 01/27/2020] HYDROcodone-acetaminophen (NORCO/VICODIN) 5-325 MG tablet, Take 1 tablet by mouth every 8 (eight) hours as needed for severe pain. Must last 30 days, Disp: 90 tablet, Rfl: 0 .  letrozole (FEMARA) 2.5 MG tablet, Take 2.5 mg by mouth daily., Disp: , Rfl:  .  losartan (COZAAR) 50 MG tablet, TAKE (2) TABLETS BY MOUTH ONCE DAILY., Disp: 180 tablet, Rfl: 3 .  lubiprostone (AMITIZA) 24 MCG capsule, TAKE 1 CAPSULE BY MOUTH ONCE DAILY WITH BREAKFAST., Disp: , Rfl:  .  magnesium oxide (MAG-OX) 400 (241.3 Mg) MG tablet, TAKE 1 TABLET BY MOUTH ONCE DAILY., Disp: 30 tablet, Rfl:  4 .  meloxicam (MOBIC) 7.5 MG tablet, TAKE 1 TABLET BY MOUTH ONCE DAILY., Disp: 30 tablet, Rfl: 4 .  metoprolol succinate (TOPROL-XL) 25 MG 24 hr tablet, TAKE 1 TABLET BY MOUTH TWICE DAILY, Disp: 60 tablet, Rfl: 10 .  Multiple Vitamin (MULTIVITAMIN) tablet, Take 1 tablet by mouth daily., Disp: , Rfl:  .  nystatin (MYCOSTATIN) 100000 UNIT/ML suspension, Swish and spit tid, Disp: 60 mL, Rfl: 0 .  Omega-3 1000 MG CAPS, Take by mouth daily. , Disp: , Rfl:  .  omeprazole (PRILOSEC) 20 MG capsule, TAKE 1 CAPSULE BY MOUTH TWICE DAILY FOR REFLUX., Disp: 60 capsule, Rfl: 1 .  pantoprazole (PROTONIX) 40 MG tablet, TAKE 1 TABLET BY MOUTH ONCE DAILY., Disp: 90 tablet, Rfl: 3 .  pravastatin (PRAVACHOL) 20 MG tablet, TAKE 1 TABLET BY MOUTH ONCE DAILY., Disp: 90 tablet,  Rfl: 3  EXAM:  GENERAL: alert.  Sounds to be in no acute distress.  Answering questions appropriately.    PSYCH/NEURO: pleasant and cooperative, no obvious depression or anxiety, speech and thought processing grossly intact  ASSESSMENT AND PLAN:  Discussed the following assessment and plan:  Anemia Colonoscopy 2017 - diverticulosis and internal hemorrhoids.  Follow cbc.   Chronic constipation Bowels controlled if monitors her diet and takes occasional colon cleanse.    Chronic low back pain (Primary Area of Pain) (Bilateral) (R>L) Being followed by pain clinic.    CKD (chronic kidney disease) stage 3, GFR 30-59 ml/min (HCC) GFR improved last visit - 64.  Stay hydrated.  Follow metabolic panel.    Essential hypertension Blood pressure under good control.  Continue same medication regimen.  Follow pressures.  Follow metabolic panel.    GERD (gastroesophageal reflux disease) Controlled.    Hypercholesterolemia On pravastatin.  Low cholesterol diet and exercise.  Follow lipid panel and liver function tests.   Hyponatremia Last sodium check stable.  Follow.   Malignant neoplasm of right female breast ([place]) On femara.   Followed by oncology.   Mild depression (HCC) On citalopram.  Stable.    Parkinsons ([place]) Followed by neurology.  Stable.   SOB (shortness of breath) See last note.  Reported sob with exertion.  Saw cardiology.  Planning for stress test and holter.  CXR - changes that appear to be c/w chronic interstitial lung disease.  Discussed pulmonary referral for further evaluation.      I discussed the assessment and treatment plan with the patient. The patient was provided an opportunity to ask questions and all were answered. The patient agreed with the plan and demonstrated an understanding of the instructions.   The patient was advised to call back or seek an in-person evaluation if the symptoms worsen or if the condition fails to improve as anticipated.  I provided 23 minutes of non-face-to-face time during this encounter.   Einar Pheasant, MD

## 2019-11-27 NOTE — Assessment & Plan Note (Signed)
Colonoscopy 2017 - diverticulosis and internal hemorrhoids.  Follow cbc.

## 2019-11-27 NOTE — Assessment & Plan Note (Signed)
Being followed by pain clinic.   

## 2019-11-27 NOTE — Assessment & Plan Note (Signed)
Last sodium check stable.  Follow.

## 2019-11-27 NOTE — Assessment & Plan Note (Signed)
Followed by neurology.  Stable  

## 2019-11-27 NOTE — Assessment & Plan Note (Signed)
Bowels controlled if monitors her diet and takes occasional colon cleanse.

## 2019-11-27 NOTE — Assessment & Plan Note (Signed)
Controlled.  

## 2019-11-27 NOTE — Assessment & Plan Note (Signed)
On citalopram.  Stable.   

## 2019-11-27 NOTE — Assessment & Plan Note (Signed)
On pravastatin.  Low cholesterol diet and exercise.  Follow lipid panel and liver function tests.   

## 2019-11-27 NOTE — Assessment & Plan Note (Signed)
See last note.  Reported sob with exertion.  Saw cardiology.  Planning for stress test and holter.  CXR - changes that appear to be c/w chronic interstitial lung disease.  Discussed pulmonary referral for further evaluation.

## 2019-11-27 NOTE — Assessment & Plan Note (Signed)
On femara.  Followed by oncology.

## 2019-11-30 ENCOUNTER — Other Ambulatory Visit: Payer: Self-pay | Admitting: Internal Medicine

## 2019-11-30 ENCOUNTER — Telehealth: Payer: Self-pay | Admitting: Internal Medicine

## 2019-11-30 DIAGNOSIS — D649 Anemia, unspecified: Secondary | ICD-10-CM

## 2019-11-30 NOTE — Telephone Encounter (Signed)
I may have already sent this to you, but she needs a B12 level checked the next time she comes in.  If she agrees, she will need to sign a waiver and I will need to order.  Please let me know and the lab will need to be notified.

## 2019-12-01 NOTE — Telephone Encounter (Signed)
Called pt  Unable to leave message

## 2019-12-03 ENCOUNTER — Other Ambulatory Visit: Payer: Self-pay | Admitting: Internal Medicine

## 2019-12-03 DIAGNOSIS — M7918 Myalgia, other site: Secondary | ICD-10-CM

## 2019-12-03 DIAGNOSIS — M159 Polyosteoarthritis, unspecified: Secondary | ICD-10-CM

## 2019-12-03 NOTE — Telephone Encounter (Signed)
Patient would like to have her b12 level checked. She is coming in for labs in January. Do you want me to order and hold the waiver to have her sign?

## 2019-12-03 NOTE — Telephone Encounter (Signed)
I have printed the waiver form.  I am not going to order until pt has signed.  Once signed the ok to order B12 - dx anemia (D64.9)

## 2019-12-04 ENCOUNTER — Ambulatory Visit (HOSPITAL_BASED_OUTPATIENT_CLINIC_OR_DEPARTMENT_OTHER): Payer: Medicare Other | Admitting: Pain Medicine

## 2019-12-04 ENCOUNTER — Other Ambulatory Visit: Payer: Self-pay

## 2019-12-04 ENCOUNTER — Telehealth: Payer: Medicare Other | Admitting: Pain Medicine

## 2019-12-04 ENCOUNTER — Ambulatory Visit
Admission: RE | Admit: 2019-12-04 | Discharge: 2019-12-04 | Disposition: A | Discharge status: Home / Self Care | Payer: Medicare Other | Source: Ambulatory Visit | Attending: Pain Medicine | Admitting: Pain Medicine

## 2019-12-04 ENCOUNTER — Encounter: Payer: Self-pay | Admitting: Pain Medicine

## 2019-12-04 VITALS — BP 182/85 | HR 62 | Temp 97.3°F | Resp 16 | Ht 64.0 in | Wt 185.0 lb

## 2019-12-04 DIAGNOSIS — M5410 Radiculopathy, site unspecified: Secondary | ICD-10-CM

## 2019-12-04 DIAGNOSIS — G8929 Other chronic pain: Secondary | ICD-10-CM | POA: Diagnosis not present

## 2019-12-04 DIAGNOSIS — M79604 Pain in right leg: Secondary | ICD-10-CM | POA: Diagnosis not present

## 2019-12-04 DIAGNOSIS — M5137 Other intervertebral disc degeneration, lumbosacral region: Secondary | ICD-10-CM

## 2019-12-04 DIAGNOSIS — M4807 Spinal stenosis, lumbosacral region: Secondary | ICD-10-CM

## 2019-12-04 MED ORDER — ROPIVACAINE HCL 2 MG/ML IJ SOLN
1.0000 mL | Freq: Once | INTRAMUSCULAR | Status: AC
  Administered 2019-12-04: 10 mL via EPIDURAL
  Filled 2019-12-04: qty 10

## 2019-12-04 MED ORDER — LACTATED RINGERS IV SOLN
1000.0000 mL | Freq: Once | INTRAVENOUS | Status: AC
  Administered 2019-12-04: 10:00:00 1000 mL via INTRAVENOUS

## 2019-12-04 MED ORDER — ROPIVACAINE HCL 2 MG/ML IJ SOLN
2.0000 mL | Freq: Once | INTRAMUSCULAR | Status: DC
  Filled 2019-12-04: qty 10

## 2019-12-04 MED ORDER — IOHEXOL 180 MG/ML  SOLN
10.0000 mL | Freq: Once | INTRAMUSCULAR | Status: AC
  Administered 2019-12-04: 10:00:00 10 mL via EPIDURAL
  Filled 2019-12-04: qty 20

## 2019-12-04 MED ORDER — LIDOCAINE HCL 2 % IJ SOLN
20.0000 mL | Freq: Once | INTRAMUSCULAR | Status: AC
  Administered 2019-12-04: 400 mg
  Filled 2019-12-04: qty 20

## 2019-12-04 MED ORDER — MIDAZOLAM HCL 5 MG/5ML IJ SOLN
1.0000 mg | INTRAMUSCULAR | Status: DC | PRN
  Administered 2019-12-04: 10:00:00 1 mg via INTRAVENOUS
  Filled 2019-12-04: qty 5

## 2019-12-04 MED ORDER — TRIAMCINOLONE ACETONIDE 40 MG/ML IJ SUSP
40.0000 mg | Freq: Once | INTRAMUSCULAR | Status: AC
  Administered 2019-12-04: 40 mg
  Filled 2019-12-04: qty 1

## 2019-12-04 MED ORDER — FENTANYL CITRATE (PF) 100 MCG/2ML IJ SOLN
25.0000 ug | INTRAMUSCULAR | Status: DC | PRN
  Administered 2019-12-04: 50 ug via INTRAVENOUS
  Filled 2019-12-04: qty 2

## 2019-12-04 MED ORDER — SODIUM CHLORIDE 0.9% FLUSH
1.0000 mL | Freq: Once | INTRAVENOUS | Status: AC
  Administered 2019-12-04: 10 mL

## 2019-12-04 MED ORDER — SODIUM CHLORIDE 0.9% FLUSH: 2.0000 mL | Freq: Once | INTRAVENOUS | Status: DC

## 2019-12-04 MED ORDER — DEXAMETHASONE SODIUM PHOSPHATE 10 MG/ML IJ SOLN
10.0000 mg | Freq: Once | INTRAMUSCULAR | Status: AC
  Administered 2019-12-04: 10:00:00 10 mg
  Filled 2019-12-04: qty 1

## 2019-12-04 NOTE — Patient Instructions (Signed)

## 2019-12-04 NOTE — Progress Notes (Signed)
Disclaimer: In compliance with Federal Rules mandating open notes, implemented by the [place], these notes are made available to patients through the electronic medical record. Information contained herein reflects the providers review of information obtained during the pre-charting review of records, as well as notes taken during the patients appointment.  Although all data contained herein was reviewed by the provider, unless specifically stated, due to time constrains, not all of it was discussed with the patient during the appointment. Specific medical language and abbreviations used within medical records is meant to communicate precise data to individuals trained to interpret such data.  Warning: These encounter notes are not meant to serve the purpose of providing patients with clear and concise information on their conditions, treatment plan, or specific risks and possible complications. Such information is provided to patients under the encounter's "Patient Information" section. Interpretation of the information contained herein should be left to individuals with the necessary training to understand the data.  Patient's Name: Wanda Martinez  MRN: [id number]  Referring Provider: Einar Pheasant, MD  DOB: 06-Apr-1935  PCP: Einar Pheasant, MD  DOS: 12/04/2019  Note by: Gaspar Cola, MD  Service setting: Ambulatory outpatient  Specialty: Interventional Pain Management  Patient type: Established  Location: [facility] (AMB) Pain Management Facility  Visit type: Interventional Procedure   Primary Reason for Visit: Interventional Pain Management Treatment. CC: Back Pain (right)  Procedure #1:  Anesthesia, Analgesia, Anxiolysis:  Type: Therapeutic Trans-Foraminal Epidural Steroid Injection   #3  Region: Lumbar Level: L5 Paravertebral Laterality: Right-Sided Paravertebral   Type: Moderate (Conscious) Sedation combined with Local Anesthesia Indication(s): Analgesia and  Anxiety Route: Intravenous (IV) IV Access: Secured Sedation: Meaningful verbal contact was maintained at all times during the procedure  Local Anesthetic: Lidocaine 1-2%  Position: Prone  Procedure #2:    Type: Therapeutic Inter-Laminar Epidural Steroid Injection   #2  Region: Lumbar Level: L5-S1 Level. Laterality: Right-Sided Paramedial     Indications: 1. Chronic lumbar radicular pain (S1) (Right)   2. Chronic lower extremity pain (Secondary Area of Pain) (Right)   3. DDD (degenerative disc disease), lumbosacral   4. 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 Chronic radicular pain of lower extremity   6. Chronic pain of right lower extremity   7. Foraminal stenosis of lumbosacral region    Pain Score: Pre-procedure: 7 /10 Post-procedure: 0-No pain/10   Pre-op Assessment:  Wanda Martinez is a 84 y.o. (year old), female patient, seen today for interventional treatment. She  has a past surgical history that includes Breast biopsy (2013); Tonsillectomy and adenoidectomy (79); Vesicovaginal fistula closure w/ TAH (1983); Breast surgery (1986); Breast enhancement surgery (1987); Breast implant removal; Breast implant removal (Right, 08/29/2012); Mastectomy (08/2012); Abdominal hysterectomy; and Colonoscopy with propofol (N/A, 09/13/2016). Wanda Martinez has a current medication list which includes the following prescription(s): amlodipine, aspirin, calcium carbonate, citalopram, cyclobenzaprine, gabapentin, glucosamine sulfate, hydrochlorothiazide, hydrocodone-acetaminophen, [START ON 12/28/2019] hydrocodone-acetaminophen, [START ON 01/27/2020] hydrocodone-acetaminophen, letrozole, losartan, lubiprostone, magnesium oxide, meloxicam, metoprolol succinate, multivitamin, nystatin, omega-3, omeprazole, pantoprazole, and pravastatin, and the following Facility-Administered Medications: fentanyl, midazolam, ropivacaine (pf) 2 mg/ml (0.2%), and sodium chloride flush. Her primarily concern  today is the Back Pain (right)  Initial Vital Signs:  Pulse/HCG Rate: 62ECG Heart Rate: 67 Temp: (!) 97.3 F (36.3 C) Resp: 14 BP: 127/71 SpO2: 99 %  BMI: Estimated body mass index is 31.76 kg/m as calculated from the following:   Height as of this encounter: 5\' 4"  (1.626 m).  Weight as of this encounter: 185 lb (83.9 kg).  Risk Assessment: Allergies: Reviewed. She is allergic to sulfa antibiotics and vesicare [solifenacin].  Allergy Precautions: None required Coagulopathies: Reviewed. None identified.  Blood-thinner therapy: None at this time Active Infection(s): Reviewed. None identified. Wanda Martinez is afebrile  Site Confirmation: Wanda Martinez was asked to confirm the procedure and laterality before marking the site Procedure checklist: Completed Consent: Before the procedure and under the influence of no sedative(s), amnesic(s), or anxiolytics, the patient was informed of the treatment options, risks and possible complications. To fulfill our ethical and legal obligations, as recommended by the American Medical Association's Code of Ethics, I have informed the patient of my clinical impression; the nature and purpose of the treatment or procedure; the risks, benefits, and possible complications of the intervention; the alternatives, including doing nothing; the risk(s) and benefit(s) of the alternative treatment(s) or procedure(s); and the risk(s) and benefit(s) of doing nothing. The patient was provided information about the general risks and possible complications associated with the procedure. These may include, but are not limited to: failure to achieve desired goals, infection, bleeding, organ or nerve damage, allergic reactions, paralysis, and death. In addition, the patient was informed of those risks and complications associated to Spine-related procedures, such as failure to decrease pain; infection (i.e.: Meningitis, epidural or intraspinal abscess); bleeding (i.e.: epidural  hematoma, subarachnoid hemorrhage, or any other type of intraspinal or peri-dural bleeding); organ or nerve damage (i.e.: Any type of peripheral nerve, nerve root, or spinal cord injury) with subsequent damage to sensory, motor, and/or autonomic systems, resulting in permanent pain, numbness, and/or weakness of one or several areas of the body; allergic reactions; (i.e.: anaphylactic reaction); and/or death. Furthermore, the patient was informed of those risks and complications associated with the medications. These include, but are not limited to: allergic reactions (i.e.: anaphylactic or anaphylactoid reaction(s)); adrenal axis suppression; blood sugar elevation that in diabetics may result in ketoacidosis or comma; water retention that in patients with history of congestive heart failure may result in shortness of breath, pulmonary edema, and decompensation with resultant heart failure; weight gain; swelling or edema; medication-induced neural toxicity; particulate matter embolism and blood vessel occlusion with resultant organ, and/or nervous system infarction; and/or aseptic necrosis of one or more joints. Finally, the patient was informed that Medicine is not an exact science; therefore, there is also the possibility of unforeseen or unpredictable risks and/or possible complications that may result in a catastrophic outcome. The patient indicated having understood very clearly. We have given the patient no guarantees and we have made no promises. Enough time was given to the patient to ask questions, all of which were answered to the patient's satisfaction. Wanda Martinez has indicated that she wanted to continue with the procedure. Attestation: I, the ordering provider, attest that I have discussed with the patient the benefits, risks, side-effects, alternatives, likelihood of achieving goals, and potential problems during recovery for the procedure that I have provided informed consent. Date  Time:  12/04/2019  9:31 AM  Pre-Procedure Preparation:  Monitoring: As per clinic protocol. Respiration, ETCO2, SpO2, BP, heart rate and rhythm monitor placed and checked for adequate function Safety Precautions: Patient was assessed for positional comfort and pressure points before starting the procedure. Time-out: I initiated and conducted the "Time-out" before starting the procedure, as per protocol. The patient was asked to participate by confirming the accuracy of the "Time Out" information. Verification of the correct person, site, and procedure were performed and confirmed by me,  the nursing staff, and the patient. "Time-out" conducted as per Joint Commission's Universal Protocol (UP.01.01.01). Time: 1003  Description of Procedure #1:  Target Area: The inferior and lateral portion of the pedicle, just lateral to a line created by the 6:00 position of the pedicle and the superior articular process of the vertebral body below. On the lateral view, this target lies just posterior to the anterior aspect of the lamina and posterior to the midpoint created between the anterior and the posterior aspect of the neural foramina. Approach: Posterior paravertebral approach. Area Prepped: Entire Posterior Lumbosacral Region Prepping solution: DuraPrep (Iodine Povacrylex [0.7% available iodine] and Isopropyl Alcohol, 74% w/w) Safety Precautions: Aspiration looking for blood return was conducted prior to all injections. At no point did we inject any substances, as a needle was being advanced. No attempts were made at seeking any paresthesias. Safe injection practices and needle disposal techniques used. Medications properly checked for expiration dates. SDV (single dose vial) medications used.  Description of the Procedure: Protocol guidelines were followed. The patient was placed in position over the fluoroscopy table. The target area was identified and the area prepped in the usual manner. Skin & deeper tissues  infiltrated with local anesthetic. Appropriate amount of time allowed to pass for local anesthetics to take effect. The procedure needles were then advanced to the target area. Proper needle placement secured. Negative aspiration confirmed. Solution injected in intermittent fashion, asking for systemic symptoms every 0.2cc of injectate. The needles were then removed and the area cleansed, making sure to leave some of the prepping solution back to take advantage of its long term bactericidal properties.  Start Time: 1003 hrs.  Materials:  Needle(s) Type: Spinal Needle Gauge: 22G Length: 3.5-in Medication(s): Please see orders for medications and dosing details.  Description of Procedure #2:  Target Area: The  interlaminar space, initially targeting the lower border of the superior vertebral body lamina. Approach: Posterior paramedial approach. Area Prepped: Same as above Prepping solution: Same as above Safety Precautions: Same as above  Description of the Procedure: Protocol guidelines were followed. The patient was placed in position over the fluoroscopy table. The target area was identified and the area prepped in the usual manner. Skin & deeper tissues infiltrated with local anesthetic. Appropriate amount of time allowed to pass for local anesthetics to take effect. The procedure needle was introduced through the skin, ipsilateral to the reported pain, and advanced to the target area. Bone was contacted and the needle walked caudad, until the lamina was cleared. The ligamentum flavum was engaged and loss-of-resistance technique used as the epidural needle was advanced. The epidural space was identified using "loss-of-resistance technique" with 2-3 ml of PF-NaCl (0.9% NSS), in a 5cc LOR glass syringe. Proper needle placement secured. Negative aspiration confirmed. Solution injected in intermittent fashion, asking for systemic symptoms every 0.5cc of injectate. The needles were then removed and the  area cleansed, making sure to leave some of the prepping solution back to take advantage of its long term bactericidal properties.  Vitals:   12/04/19 1016 12/04/19 1026 12/04/19 1036 12/04/19 1046  BP: (!) 187/79 (!) 189/82 (!) 183/85 (!) 182/85  Pulse:      Resp: 14 15 16 16   Temp:  (!) 97.2 F (36.2 C)  (!) 97.3 F (36.3 C)  SpO2: 99% 99% 99% 98%  Weight:      Height:        End Time: 1015 hrs.  Materials:  Needle(s) Type: Epidural needle Gauge: 17G Length: 3.5-in  Medication(s): Please see orders for medications and dosing details.  Imaging Guidance (Spinal):          Type of Imaging Technique: Fluoroscopy Guidance (Spinal) Indication(s): Assistance in needle guidance and placement for procedures requiring needle placement in or near specific anatomical locations not easily accessible without such assistance. Exposure Time: Please see nurses notes. Contrast: Before injecting any contrast, we confirmed that the patient did not have an allergy to iodine, shellfish, or radiological contrast. Once satisfactory needle placement was completed at the desired level, radiological contrast was injected. Contrast injected under live fluoroscopy. No contrast complications. See chart for type and volume of contrast used. Fluoroscopic Guidance: I was personally present during the use of fluoroscopy. "Tunnel Vision Technique" used to obtain the best possible view of the target area. Parallax error corrected before commencing the procedure. "Direction-depth-direction" technique used to introduce the needle under continuous pulsed fluoroscopy. Once target was reached, antero-posterior, oblique, and lateral fluoroscopic projection used confirm needle placement in all planes. Images permanently stored in EMR. Interpretation: I personally interpreted the imaging intraoperatively. Adequate needle placement confirmed in multiple planes. Appropriate spread of contrast into desired area was observed. No  evidence of afferent or efferent intravascular uptake. No intrathecal or subarachnoid spread observed. Permanent images saved into the patient's record.  Antibiotic Prophylaxis:   Anti-infectives (From admission, onward)   None     Indication(s): None identified  Post-operative Assessment:  Post-procedure Vital Signs:  Pulse/HCG Rate: 6268 Temp: (!) 97.3 F (36.3 C) Resp: 16 BP: (!) 182/85 SpO2: 98 %  EBL: None  Complications: No immediate post-treatment complications observed by team, or reported by patient.  Note: The patient tolerated the entire procedure well. A repeat set of vitals were taken after the procedure and the patient was kept under observation following institutional policy, for this type of procedure. Post-procedural neurological assessment was performed, showing return to baseline, prior to discharge. The patient was provided with post-procedure discharge instructions, including a section on how to identify potential problems. Should any problems arise concerning this procedure, the patient was given instructions to immediately contact us, at any time, without hesitation. In any case, we plan to contact the patient by telephone for a follow-up status report regarding this interventional procedure.  Comments:  No additional relevant information.  Plan of Care  Orders:  Orders Placed This Encounter  Procedures  . LESI (Today)    Scheduling Instructions:     Procedure: Interlaminar LESI L5-S1     Laterality: Right-sided     Sedation: Patient's choice     Timeframe:  Today    Order Specific Question:   Where will this procedure be performed?    Answer:   [facility]  . Lumbar Trans-Foraminal (Today)    Scheduling Instructions:     Side: Right-sided     Level: L5     Sedation: With Sedation.     Timeframe: Today    Order Specific Question:   Where will this procedure be performed?    Answer:   [facility]  . Fluoro (C-Arm) (<60 min) (No  Report)    Intraoperative interpretation by procedural physician at [place].    Standing Status:   Standing    Number of Occurrences:   1    Order Specific Question:   Reason for exam:    Answer:   Assistance in needle guidance and placement for procedures requiring needle placement in or near specific anatomical locations not easily accessible without such  assistance.  . Consent: LESI    Provider Attestation: I, [place] Dossie Arbour, MD, (Pain Management Specialist), the physician/practitioner, attest that I have discussed with the patient the benefits, risks, side effects, alternatives, likelihood of achieving goals and potential problems during recovery for the procedure that I have provided informed consent.    Scheduling Instructions:     Procedure: Lumbar epidural steroid injection under fluoroscopic guidance     Indications: Low back and/or lower extremity pain secondary to lumbar radiculitis     Note: Always confirm laterality of pain with Wanda Martinez, before procedure.     Transcribe to consent form and obtain patient signature.  . Consent: L-TFESI    Provider Attestation: I, [place] Dossie Arbour, MD, (Pain Management Specialist), the physician/practitioner, attest that I have discussed with the patient the benefits, risks, side effects, alternatives, likelihood of achieving goals and potential problems during recovery for the procedure that I have provided informed consent.    Scheduling Instructions:     Procedure: Diagnostic lumbar transforaminal epidural steroid injection under fluoroscopic guidance. (See notes for level and laterality.)     Indication/Reason: Lumbar radiculopathy/radiculitis associated with lumbar stenosis     Note: Always confirm laterality of pain with Wanda Martinez, before procedure.     Transcribe to consent form and obtain patient signature.  Marland Kitchen Epidural Tray    Equipment required: Single use, disposable, "Epidural Tray" Epidural Catheter: NOT  required    Standing Status:   Standing    Number of Occurrences:   1    Order Specific Question:   Specify    Answer:   Epidural Tray   Chronic Opioid Analgesic:  Hydrocodone/APAP 5/325, 1 tab PO q 8 hrs (15 mg/day of hydrocodone) MME/day:15mg /day.   Medications ordered for procedure: Meds ordered this encounter  Medications  . iohexol (OMNIPAQUE) 180 MG/ML injection 10 mL    Must be Myelogram-compatible. If not available, you may substitute with a water-soluble, non-ionic, hypoallergenic, myelogram-compatible radiological contrast medium.  Marland Kitchen lidocaine (XYLOCAINE) 2 % (with pres) injection 400 mg  . lactated ringers infusion 1,000 mL  . midazolam (VERSED) 5 MG/5ML injection 1-2 mg    Make sure Flumazenil is available in the pyxis when using this medication. If oversedation occurs, administer 0.2 mg IV over 15 sec. If after 45 sec no response, administer 0.2 mg again over 1 min; may repeat at 1 min intervals; not to exceed 4 doses (1 mg)  . fentaNYL (SUBLIMAZE) injection 25-50 mcg    Make sure Narcan is available in the pyxis when using this medication. In the event of respiratory depression (RR< 8/min): Titrate NARCAN (naloxone) in increments of 0.1 to 0.2 mg IV at 2-3 minute intervals, until desired degree of reversal.  . sodium chloride flush (NS) 0.9 % injection 2 mL  . ropivacaine (PF) 2 mg/mL (0.2%) (NAROPIN) injection 2 mL  . triamcinolone acetonide (KENALOG-40) injection 40 mg  . sodium chloride flush (NS) 0.9 % injection 1 mL  . ropivacaine (PF) 2 mg/mL (0.2%) (NAROPIN) injection 1 mL  . dexamethasone (DECADRON) injection 10 mg   Medications administered: We administered iohexol, lidocaine, lactated ringers, midazolam, fentaNYL, triamcinolone acetonide, sodium chloride flush, ropivacaine (PF) 2 mg/mL (0.2%), and dexamethasone.  See the medical record for exact dosing, route, and time of administration.  Follow-up plan:   Return in about 2 weeks (around 12/18/2019) for  (VV), (PP).       Interventional treatment options:  Under consideration: Diagnosticbilateral cervical facet block  Possiblebilateral cervical facet RFA  Therapeutic/palliative (PRN): Diagnostic right CESI #2  Palliative bilateral trochanteric bursa injection #2  Diagnosticright IA hip joint injection #2  Palliative right lumbar facet block #7  Palliative left lumbar facet block #4  Palliative right lumbar facet RFA #3(last done06/18/19) Palliative left lumbar facet RFA #2 (last done 02/12/2018) Diagnostic/Therapeutic right SI block #2  Diagnostic/Therapeutic left SI block #2  Palliative right S-I RFA #4 (last done 06/05/2019) Palliative right suprascapular muscle TPI/MNB #3  Palliativeright L3-4 LESI#3 Palliative right L5 TFESI #3 Diagnostic right L5-S1 LESI #2     Recent Visits Date Type Provider Dept  11/26/19 Telemedicine Milinda Pointer, MD [facility]  11/20/19 Procedure visit Milinda Pointer, MD [facility]  11/06/19 Procedure visit Milinda Pointer, MD [facility]  Showing recent visits within past 90 days and meeting all other requirements   Today's Visits Date Type Provider Dept  12/04/19 Procedure visit Milinda Pointer, MD [facility]  Showing today's visits and meeting all other requirements   Future Appointments Date Type Provider Dept  12/22/19 Appointment Milinda Pointer, MD [facility]  02/25/20 Appointment Milinda Pointer, MD [facility]  Showing future appointments within next 90 days and meeting all other requirements   Disposition: Discharge home  Discharge Date & Time: 12/04/2019; 1049 hrs.   Primary Care Physician: Einar Pheasant, MD Location: [facility] Note by: Gaspar Cola, MD Date: 12/04/2019; Time: 11:27 AM  Disclaimer:  Medicine is not an exact science. The only guarantee in medicine is that nothing is guaranteed.  It is important to note that the decision to proceed with this intervention was based on the information collected from the patient. The Data and conclusions were drawn from the patient's questionnaire, the interview, and the physical examination. Because the information was provided in large part by the patient, it cannot be guaranteed that it has not been purposely or unconsciously manipulated. Every effort has been made to obtain as much relevant data as possible for this evaluation. It is important to note that the conclusions that lead to this procedure are derived in large part from the available data. Always take into account that the treatment will also be dependent on availability of resources and existing treatment guidelines, considered by other Pain Management Practitioners as being common knowledge and practice, at the time of the intervention. For Medico-Legal purposes, it is also important to point out that variation in procedural techniques and pharmacological choices are the acceptable norm. The indications, contraindications, technique, and results of the above procedure should only be interpreted and judged by a Board-Certified Interventional Pain Specialist with extensive familiarity and expertise in the same exact procedure and technique.

## 2019-12-04 NOTE — Progress Notes (Signed)
Safety precautions to be maintained throughout the outpatient stay will include: orient to surroundings, keep bed in low position, maintain call bell within reach at all times, provide assistance with transfer out of bed and ambulation.  

## 2019-12-05 ENCOUNTER — Telehealth: Payer: Self-pay

## 2019-12-05 ENCOUNTER — Ambulatory Visit: Payer: Medicare Other

## 2019-12-05 NOTE — Telephone Encounter (Signed)
Post procedure phone call.  Patient states she woke up with a bad headache and a temperature of 99.  Denies any redness at injection site.  Denies any other complaints.  Instructed patient to page Dr Dossie Arbour  Or go to the ED for any increase in temperature or any other complaints.

## 2019-12-06 NOTE — Telephone Encounter (Signed)
Received a request for meloxicam.  She is seeing pain clinic now.  Given her kidney function, I would like for her to minimize the amount of antiinflammatories.  See if she would be willing a trial off meloxicam.

## 2019-12-08 ENCOUNTER — Other Ambulatory Visit: Payer: Medicare Other

## 2019-12-09 NOTE — Telephone Encounter (Signed)
LMTCB

## 2019-12-11 NOTE — Telephone Encounter (Signed)
Holding message and ABN until 01/05/20

## 2019-12-16 ENCOUNTER — Telehealth: Payer: Self-pay | Admitting: *Deleted

## 2019-12-16 NOTE — Telephone Encounter (Signed)
Attempted to call for pre appointment review of allergies/meds. Voice mailbox not set up, message left at home number.

## 2019-12-17 ENCOUNTER — Telehealth: Payer: Self-pay

## 2019-12-17 ENCOUNTER — Encounter: Payer: Self-pay | Admitting: Pain Medicine

## 2019-12-17 NOTE — Progress Notes (Signed)
Pain relief after procedure (treated area only): (Questions asked to patient) 1. Starting about 15 minutes after the procedure, and "while the area was still numb" (from the local anesthetics), were you having any of your usual pain "in that area" (the treated area)?  (NOTE: NOT including the discomfort from the needle sticks.) First 1 hour: 100 % better. First 4-6 hours:100 % better.   3. How much better is your pain now, when compared to before the procedure? Current benefit: 50 % better. 4. Can you move better now? Improvement in ROM (Range of Motion): Yes. 5. Can you do more now? Improvement in function: Yes. 4. Did you have any problems with the procedure? Side-effects/Complications: No. 

## 2019-12-21 NOTE — Progress Notes (Addendum)
Virtual Encounter - Pain Management PROVIDER NOTE: Information contained herein reflects review and annotations entered in association with encounter. Interpretation of such information and data should be left to medically-trained personnel. Information provided to patient can be located elsewhere in the medical record under "Patient Instructions". Document created using STT-dictation technology, any transcriptional errors that may result from process are unintentional.    Contact & Pharmacy Preferred: [phone] Home: [phone] (home) Mobile: [phone] (mobile) E-mail: No e-mail address on record  [place], [place] - [address] [place] [address] Phone: [phone] Fax: [phone]   Pre-screening  Wanda Martinez offered "in-person" vs "virtual" encounter. She indicated preferring virtual for this encounter.   Reason COVID-19*  Social distancing based on CDC and AMA recommendations.   I contacted [name] on 12/22/2019 via telephone.      I clearly identified myself as Gaspar Cola, MD. I verified that I was speaking with the correct person using two identifiers (Name: Wanda Martinez, and date of birth: [birth year]).  Consent I sought verbal advanced consent from [name] for virtual visit interactions. I informed Wanda Martinez of possible security and privacy concerns, risks, and limitations associated with providing "not-in-person" medical evaluation and management services. I also informed Wanda Martinez of the availability of "in-person" appointments. Finally, I informed her that there would be a charge for the virtual visit and that she could be  personally, fully or partially, financially responsible for it. Wanda Martinez expressed understanding and agreed to proceed.   Historic Elements   Wanda Martinez is a 85 y.o. year old, female patient evaluated today after her last encounter by our  practice on 12/17/2019. Wanda Martinez  has a past medical history of Acute postoperative pain (10/25/2017), Anemia, Arm pain (07/26/2015), Arthritis, Arthritis, degenerative (03/26/2014), Back pain (11/01/2013), Bladder infection (06/2018), Breast cancer ([place]), Breast cancer ([place]), CHEST PAIN (04/29/2010), Chronic cystitis, Cystocele (02/22/2013), Cystocele, midline (08/19/2013), Degeneration of intervertebral disc of lumbosacral region (03/26/2014), DYSPNEA (04/29/2010), Enthesopathy of hip (03/26/2014), GERD (gastroesophageal reflux disease), Hiatal hernia, HTN (hypertension), Hypokalemia (06/2018), Hyponatremia (06/2018), LBP (low back pain) (03/26/2014), Neck pain (11/01/2013), Parkinson disease ([place]), Pneumonia (06/2018), Sinusitis (02/07/2015), Skin lesions (07/12/2014), and Urinary incontinence. She also  has a past surgical history that includes Breast biopsy (2013); Tonsillectomy and adenoidectomy (79); Vesicovaginal fistula closure w/ TAH (1983); Breast surgery (1986); Breast enhancement surgery (1987); Breast implant removal; Breast implant removal (Right, 08/29/2012); Mastectomy (08/2012); Abdominal hysterectomy; and Colonoscopy with propofol (N/A, 09/13/2016). Wanda Martinez has a current medication list which includes the following prescription(s): amlodipine, aspirin, calcium carbonate, citalopram, cyclobenzaprine, gabapentin, glucosamine sulfate, hydrochlorothiazide, [START ON 12/28/2019] hydrocodone-acetaminophen, [START ON 01/27/2020] hydrocodone-acetaminophen, [START ON 02/26/2020] hydrocodone-acetaminophen, letrozole, losartan, lubiprostone, magnesium oxide, meloxicam, metoprolol succinate, multivitamin, nystatin, omega-3, omeprazole, pantoprazole, and pravastatin. She  reports that she has never smoked. She has never used smokeless tobacco. She reports that she does not drink alcohol or use drugs. Wanda Martinez is allergic to sulfa antibiotics and vesicare [solifenacin].   HPI  Today, she is being contacted for both, medication  management and a post-procedure assessment.  The patient indicates that since the last time that she spoke to the nursing staff, her pain has actually improved further to the point where she currently is enjoying about 75% improvement over what she had before.  She also indicates that because the holiday rush his winding down, she is not having to do as many things and this is helping keeping her  pain even better.  At this point she indicates that she does not really need anything else except to stay on her medications which actually help her accomplish her tasks of daily living.  The patient indicates doing well with the current medication regimen. No adverse reactions or side effects reported to the medications.   Post-Procedure Evaluation  Procedure: Therapeutic right L5 TFESI #3 + right L5-S1 LESI #2 under fluoroscopic guidance and IV sedation Pre-procedure pain level:  7/10 Post-procedure: 0/10 (100% relief)  Sedation: Sedation provided.  Dewayne Shorter, RN  12/22/2019 10:58 AM  Signed Pain relief after procedure (treated area only): (Questions asked to patient) 1. Starting about 15 minutes after the procedure, and "while the area was still numb" (from the local anesthetics), were you having any of your usual pain "in that area" (the treated area)?  (NOTE: NOT including the discomfort from the needle sticks.) First 1 hour:100% better. First 4-6 hours: 100 % better.  3. How much better is your pain now, when compared to before the procedure? Current benefit: 50 % better. 4. Can you move better now? Improvement in ROM (Range of Motion): Yes. 5. Can you do more now? Improvement in function: Yes. 4. Did you have any problems with the procedure? Side-effects/Complications: No.  Current benefits: Defined as benefit that persist at this time.   Analgesia:  75% Function: Wanda Martinez reports improvement in function ROM: Wanda Martinez reports improvement in ROM  Pharmacotherapy Assessment   Analgesic: Hydrocodone/APAP 5/325, 1 tab PO q 8 hrs (15 mg/day of hydrocodone) MME/day:15mg /day.   Monitoring: Pharmacotherapy: No side-effects or adverse reactions reported. [place] PMP: PDMP reviewed during this encounter.       Compliance: No problems identified. Effectiveness: Clinically acceptable. Plan: Refer to "POC".  UDS:  Summary  Date Value Ref Range Status  01/08/2019 FINAL  Final    Comment:    ==================================================================== TOXASSURE SELECT 13 (MW) ==================================================================== Test                             Result       Flag       Units Drug Present and Declared for Prescription Verification   Hydrocodone                    1944         EXPECTED   ng/mg creat   Dihydrocodeine                 194          EXPECTED   ng/mg creat   Norhydrocodone                 2532         EXPECTED   ng/mg creat    Sources of hydrocodone include scheduled prescription    medications. Dihydrocodeine and norhydrocodone are expected    metabolites of hydrocodone. Dihydrocodeine is also available as a    scheduled prescription medication. ==================================================================== Test                      Result    Flag   Units      Ref Range   Creatinine              188              mg/dL      >=20 ==================================================================== Declared Medications:  The flagging  and interpretation on this report are based on the  following declared medications.  Unexpected results may arise from  inaccuracies in the declared medications.  **Note: The testing scope of this panel includes these medications:  Hydrocodone (Hydrocodone-Acetaminophen)  **Note: The testing scope of this panel does not include following  reported medications:  Acetaminophen (Hydrocodone-Acetaminophen)  Acyclovir  Amlodipine Besylate  Aspirin  Calcium carbonate  Citalopram   Cyclobenzaprine  Gabapentin  Glucosamine  Hydrochlorothiazide  Letrozole  Losartan (Losartan Potassium)  Lubiprostone  Magnesium Oxide  Meloxicam  Metoprolol  Multivitamin  Omega-3 Fatty Acids  Omeprazole  Pantoprazole  Pravastatin ==================================================================== For clinical consultation, please call [phone]. ====================================================================    Laboratory Chemistry Profile (12 mo)  Renal: 01/08/2019: BUN/Creatinine Ratio 12 10/17/2019: BUN 25; Creatinine, Ser 1.13  Lab Results  Component Value Date   GFR 46.10 (L) 10/17/2019   GFRAA 37 (L) 07/24/2019   GFRNONAA 32 (L) 07/24/2019   Hepatic: 10/17/2019: Albumin 4.4 Lab Results  Component Value Date   AST 28 10/17/2019   ALT 20 10/17/2019   Other: No results found for requested labs within last 8760 hours. Note: Above Lab results reviewed.  Imaging  Fluoro (C-Arm) (<60 min) (No Report) Fluoro was used, but no Radiologist interpretation will be provided.  Please refer to "NOTES" tab for provider progress note.   Assessment  The primary encounter diagnosis was Chronic low back pain (Primary Area of Pain) (Bilateral) (R>L). 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  Plan of Care  Problem-specific:  No problem-specific Assessment & Plan notes found for this encounter.  I am having Wanda Martinez maintain her aspirin, calcium carbonate, multivitamin, lubiprostone, GLUCOSAMINE SULFATE PO, Omega-3, citalopram, omeprazole, gabapentin, letrozole, hydrochlorothiazide, metoprolol succinate, cyclobenzaprine, meloxicam, pantoprazole, amLODipine, losartan, pravastatin, nystatin, HYDROcodone-acetaminophen, HYDROcodone-acetaminophen, magnesium  oxide, and HYDROcodone-acetaminophen.  Pharmacotherapy (Medications Ordered): Meds ordered this encounter  Medications  . HYDROcodone-acetaminophen (NORCO/VICODIN) 5-325 MG tablet    Sig: Take 1 tablet by mouth every 8 (eight) hours as needed for severe pain. Must last 30 days    Dispense:  90 tablet    Refill:  0    Chronic Pain: STOP Act (Not applicable) Fill 1 day early if closed on refill date. Do not fill until: 02/26/2020. To last until: 03/27/2020. Avoid benzodiazepines within 8 hours of opioids   Orders:  No orders of the defined types were placed in this encounter.  Follow-up plan:   Return in about 3 months (around 03/24/2020) for (VV), (MM).      Interventional treatment options:  Under consideration: Diagnosticbilateral cervical facet block  Possiblebilateral cervical facet RFA    Therapeutic/palliative (PRN): Diagnostic right CESI #2  Palliative bilateral trochanteric bursa injection #2  Diagnosticright IA hip joint injection #2  Palliative right lumbar facet block #7  Palliative left lumbar facet block #4  Palliative right lumbar facet RFA #3(last done06/18/19) Palliative left lumbar facet RFA #2 (last done 02/12/2018) Diagnostic/Therapeutic right SI block #2  Diagnostic/Therapeutic left SI block #2  Palliative right S-I RFA #4 (last done 06/05/2019) Palliative right suprascapular muscle TPI/MNB #3  Palliativeright L3-4 LESI#3 Palliative right L5 TFESI #4 Diagnostic right L5-S1 LESI #3     Recent Visits Date Type Provider Dept  12/04/19 Procedure visit Milinda Pointer, [facility]  11/26/19 Telemedicine Milinda Pointer, [facility]  11/20/19 Procedure  visit Milinda Pointer, MD [facility]  11/06/19 Procedure visit Milinda Pointer, MD [facility]  Showing recent visits within past 90 days and meeting all other requirements   Today's Visits Date Type Provider Dept  12/22/19 Telemedicine  Milinda Pointer, MD [facility]  Showing today's visits and meeting all other requirements   Future Appointments Date Type Provider Dept  02/25/20 Appointment Milinda Pointer, MD [facility]  Showing future appointments within next 90 days and meeting all other requirements   I discussed the assessment and treatment plan with the patient. The patient was provided an opportunity to ask questions and all were answered. The patient agreed with the plan and demonstrated an understanding of the instructions.  Patient advised to call back or seek an in-person evaluation if the symptoms or condition worsens.  Total duration of non-face-to-face encounter: 13 minutes.  Note by: Gaspar Cola, MD Date: 12/22/2019; Time: 11:02 AM

## 2019-12-22 ENCOUNTER — Ambulatory Visit: Payer: Medicare Other | Attending: Pain Medicine | Admitting: Pain Medicine

## 2019-12-22 ENCOUNTER — Other Ambulatory Visit: Payer: Self-pay

## 2019-12-22 DIAGNOSIS — M79604 Pain in right leg: Secondary | ICD-10-CM

## 2019-12-22 DIAGNOSIS — M545 Low back pain, unspecified: Secondary | ICD-10-CM

## 2019-12-22 DIAGNOSIS — G894 Chronic pain syndrome: Secondary | ICD-10-CM

## 2019-12-22 DIAGNOSIS — G8929 Other chronic pain: Secondary | ICD-10-CM

## 2019-12-22 DIAGNOSIS — M48062 Spinal stenosis, lumbar region with neurogenic claudication: Secondary | ICD-10-CM | POA: Diagnosis not present

## 2019-12-22 DIAGNOSIS — M4807 Spinal stenosis, lumbosacral region: Secondary | ICD-10-CM

## 2019-12-22 MED ORDER — HYDROCODONE-ACETAMINOPHEN 5-325 MG PO TABS: 1.0000 | ORAL_TABLET | Freq: Three times a day (TID) | ORAL | 0 refills | Status: DC | PRN

## 2019-12-30 ENCOUNTER — Ambulatory Visit
Admission: RE | Admit: 2019-12-30 | Discharge: 2019-12-30 | Disposition: A | Discharge status: Home / Self Care | Payer: Medicare Other | Source: Ambulatory Visit | Attending: Pain Medicine | Admitting: Pain Medicine

## 2019-12-30 ENCOUNTER — Other Ambulatory Visit: Payer: Self-pay

## 2019-12-30 DIAGNOSIS — M25551 Pain in right hip: Secondary | ICD-10-CM | POA: Diagnosis not present

## 2019-12-30 DIAGNOSIS — M48062 Spinal stenosis, lumbar region with neurogenic claudication: Secondary | ICD-10-CM | POA: Insufficient documentation

## 2019-12-30 DIAGNOSIS — M1611 Unilateral primary osteoarthritis, right hip: Secondary | ICD-10-CM | POA: Insufficient documentation

## 2019-12-30 DIAGNOSIS — G8929 Other chronic pain: Secondary | ICD-10-CM

## 2019-12-30 DIAGNOSIS — M5410 Radiculopathy, site unspecified: Secondary | ICD-10-CM | POA: Insufficient documentation

## 2019-12-30 DIAGNOSIS — M5136 Other intervertebral disc degeneration, lumbar region: Secondary | ICD-10-CM | POA: Diagnosis not present

## 2019-12-30 DIAGNOSIS — M79604 Pain in right leg: Secondary | ICD-10-CM | POA: Insufficient documentation

## 2019-12-30 DIAGNOSIS — M545 Low back pain: Secondary | ICD-10-CM | POA: Diagnosis not present

## 2019-12-30 DIAGNOSIS — M4807 Spinal stenosis, lumbosacral region: Secondary | ICD-10-CM | POA: Diagnosis not present

## 2019-12-31 DIAGNOSIS — Z23 Encounter for immunization: Secondary | ICD-10-CM | POA: Diagnosis not present

## 2020-01-01 ENCOUNTER — Encounter: Payer: Self-pay | Admitting: Pulmonary Disease

## 2020-01-01 ENCOUNTER — Ambulatory Visit: Payer: Medicare Other | Admitting: Pulmonary Disease

## 2020-01-01 ENCOUNTER — Other Ambulatory Visit: Payer: Self-pay

## 2020-01-01 VITALS — BP 128/78 | HR 75 | Temp 97.4°F | Ht 64.0 in | Wt 187.8 lb

## 2020-01-01 DIAGNOSIS — R251 Tremor, unspecified: Secondary | ICD-10-CM

## 2020-01-01 DIAGNOSIS — R0602 Shortness of breath: Secondary | ICD-10-CM

## 2020-01-01 NOTE — Progress Notes (Signed)
 Assessment & Plan:  1. Shortness of breath (Primary)  2. Tremors of nervous system   Patient Instructions  1.  You did well on your walking test today.  2.  We will schedule a heart test to evaluate for potential causes of heart issues causing your shortness of breath  3.  See you in follow-up in 4 to 6 weeks time.  Please note: late entry documentation due to logistical difficulties during COVID-19 pandemic. This note is filed for information purposes only, and is not intended to be used for billing, nor does it represent the full scope/nature of the visit in question. Please see any associated scanned media linked to date of encounter for additional pertinent information.  Subjective:    HPI: Wanda Martinez is a 85 y.o. female presenting to the pulmonology clinic on 01/01/2020 with report of: Pulmonary Consult (Referred by PCP for SOB. Has been having difficulites for a year. Pt denies any cough, wheezing, fever, or chills.)   Wanda Martinez is an 85 year old lifelong never smoker who presents for the issue of dyspnea about 1 to 1.5 years duration.  She has noted that this dyspnea is more marked with performing activities of daily living such as cleaning and going upstairs.  She feels that the onset was gradual.  She has not had any cough, paroxysmal nocturnal dyspnea, orthopnea or lower extremity edema.  No sputum production or hemoptysis.  She has had issues with occasional palpitations.  She has very rare snoring, husband does not report any apneas.  She feels generally refreshed when she wakes up in the morning.  She has not noticed anything improving her dyspnea except for stopping activity when it occurs.  He is on no medications for this.  Of note, he had been diagnosed with potential Parkinson's and was on Sinemet previously.  In May 2019 Sinemet was discontinued and she states that this has made her movements less fluid and more awkward.  She has an upcoming appointment with  her neurologist and will discuss potential restarting of this medication.  Incidentally, she feels that her dyspnea started around the time the Sinemet was discontinued.  She is also on gabapentin  which can have side effects of dyspnea.  Outpatient Encounter Medications as of 01/01/2020  Medication Sig Note   aspirin 81 MG EC tablet Take 81 mg by mouth daily as needed.    calcium  carbonate (OSCAL) 1500 (600 Ca) MG TABS tablet Take 1 tablet by mouth daily.    Multiple Vitamin (MULTIVITAMIN) tablet Take 1 tablet by mouth daily.    [DISCONTINUED] amLODipine  (NORVASC ) 2.5 MG tablet TAKE 1 TABLET BY MOUTH ONCE DAILY.    [DISCONTINUED] citalopram  (CELEXA ) 20 MG tablet TAKE 1 & 1/2 TABLETS BY MOUTH ONCE DAILY    [DISCONTINUED] cyclobenzaprine  (FLEXERIL ) 10 MG tablet TAKE ONE TABLET BY MOUTH AT BEDTIME.    [DISCONTINUED] gabapentin  (NEURONTIN ) 100 MG capsule Take 200 mg by mouth 3 (three) times daily.     [DISCONTINUED] GLUCOSAMINE SULFATE PO Take by mouth daily. (Patient not taking: Reported on 05/02/2023)    [DISCONTINUED] hydrochlorothiazide  (HYDRODIURIL ) 25 MG tablet TAKE 1 TABLET BY MOUTH ONCE DAILY. (PATIENT TAKES 1/2 ONCE DAILY AND EXTRA 1/2 IF NEEDED)    [DISCONTINUED] HYDROcodone -acetaminophen  (NORCO/VICODIN) 5-325 MG tablet Take 1 tablet by mouth every 8 (eight) hours as needed for severe pain. Must last 30 days 12/22/2019: Future Prescription, NOT DUPLICATES. >>>DO NOT DELETE, even if Expired!!!<<< See Care Coordination Note from [facility] (Dr. Tanya)   [  DISCONTINUED] HYDROcodone -acetaminophen  (NORCO/VICODIN) 5-325 MG tablet Take 1 tablet by mouth every 8 (eight) hours as needed for severe pain. Must last 30 days 12/22/2019: Future Prescription, NOT DUPLICATES. >>>DO NOT DELETE, even if Expired!!!<<< See Care Coordination Note from [facility] (Dr. Tanya)   [DISCONTINUED] HYDROcodone -acetaminophen  (NORCO/VICODIN) 5-325 MG tablet Take 1 tablet by mouth every 8 (eight) hours as  needed for severe pain. Must last 30 days 12/22/2019: Future Prescription, NOT DUPLICATES. >>>DO NOT DELETE, even if Expired!!!<<< See Care Coordination Note from [facility] (Dr. Tanya)     [DISCONTINUED] letrozole  (FEMARA ) 2.5 MG tablet Take 2.5 mg by mouth daily.    [DISCONTINUED] losartan  (COZAAR ) 50 MG tablet TAKE (2) TABLETS BY MOUTH ONCE DAILY.    [DISCONTINUED] lubiprostone  (AMITIZA ) 24 MCG capsule TAKE 1 CAPSULE BY MOUTH ONCE DAILY WITH BREAKFAST.    [DISCONTINUED] magnesium  oxide (MAG-OX) 400 (241.3 Mg) MG tablet TAKE 1 TABLET BY MOUTH ONCE DAILY.    [DISCONTINUED] meloxicam  (MOBIC ) 7.5 MG tablet TAKE 1 TABLET BY MOUTH ONCE DAILY.    [DISCONTINUED] metoprolol  succinate (TOPROL -XL) 25 MG 24 hr tablet TAKE 1 TABLET BY MOUTH TWICE DAILY    [DISCONTINUED] nystatin  (MYCOSTATIN ) 100000 UNIT/ML suspension Swish and spit tid (Patient not taking: Reported on 03/15/2021)    [DISCONTINUED] Omega-3 1000 MG CAPS Take by mouth daily.  (Patient not taking: No sig reported)    [DISCONTINUED] omeprazole  (PRILOSEC) 20 MG capsule TAKE 1 CAPSULE BY MOUTH TWICE DAILY FOR REFLUX.    [DISCONTINUED] pantoprazole  (PROTONIX ) 40 MG tablet TAKE 1 TABLET BY MOUTH ONCE DAILY.    [DISCONTINUED] pravastatin  (PRAVACHOL ) 20 MG tablet TAKE 1 TABLET BY MOUTH ONCE DAILY.    No facility-administered encounter medications on file as of 01/01/2020.    Review of Systems  Constitutional: Positive for activity change.  HENT: Negative.   Eyes: Negative.   Respiratory: Positive for shortness of breath.   Cardiovascular: Positive for palpitations.  Gastrointestinal: Negative.   Endocrine: Negative.   Genitourinary: Negative.   Musculoskeletal: Negative.   Skin: Negative.   Neurological: Positive for tremors and weakness.       Clumsiness  Hematological: Negative.   Psychiatric/Behavioral: Negative.   All other systems reviewed and are negative.     Objective:   Vitals:   01/01/20 1009  BP: 128/78   Pulse: 75  Temp: (!) 97.4 F (36.3 C)  Height: 5' 4 (1.626 m)  Weight: 187 lb 12.8 oz (85.2 kg)  SpO2: 99% Comment: on ra  TempSrc: Temporal  BMI (Calculated): 32.22      Physical exam documentation is limited by delayed entry of information.

## 2020-01-01 NOTE — Patient Instructions (Addendum)
1.  You did well on your walking test today.  2.  We will schedule a heart test to evaluate for potential causes of heart issues causing your shortness of breath  3.  See you in follow-up in 4 to 6 weeks time.

## 2020-01-02 ENCOUNTER — Other Ambulatory Visit: Payer: Self-pay

## 2020-01-05 ENCOUNTER — Other Ambulatory Visit (INDEPENDENT_AMBULATORY_CARE_PROVIDER_SITE_OTHER): Payer: Medicare Other

## 2020-01-05 ENCOUNTER — Other Ambulatory Visit: Payer: Self-pay | Admitting: Internal Medicine

## 2020-01-05 ENCOUNTER — Other Ambulatory Visit: Payer: Self-pay

## 2020-01-05 DIAGNOSIS — I10 Essential (primary) hypertension: Secondary | ICD-10-CM | POA: Diagnosis not present

## 2020-01-05 DIAGNOSIS — D649 Anemia, unspecified: Secondary | ICD-10-CM

## 2020-01-05 DIAGNOSIS — M159 Polyosteoarthritis, unspecified: Secondary | ICD-10-CM

## 2020-01-05 DIAGNOSIS — R739 Hyperglycemia, unspecified: Secondary | ICD-10-CM

## 2020-01-05 LAB — BASIC METABOLIC PANEL
BUN: 23 mg/dL (ref 6–23)
CO2: 27 mEq/L (ref 19–32)
Calcium: 9.3 mg/dL (ref 8.4–10.5)
Chloride: 98 mEq/L (ref 96–112)
Creatinine, Ser: 1.17 mg/dL (ref 0.40–1.20)
GFR: 44.26 mL/min — ABNORMAL LOW (ref >60.00)
Glucose, Bld: 85 mg/dL (ref 70–99)
Potassium: 4.2 mEq/L (ref 3.5–5.1)
Sodium: 135 mEq/L (ref 135–145)

## 2020-01-05 LAB — CBC WITH DIFFERENTIAL/PLATELET
Basophils Absolute: 0.1 10*3/uL (ref 0.0–0.1)
Basophils Relative: 1.5 % (ref 0.0–3.0)
Eosinophils Absolute: 0.3 10*3/uL (ref 0.0–0.7)
Eosinophils Relative: 5.1 % — ABNORMAL HIGH (ref 0.0–5.0)
HCT: 35.1 % — ABNORMAL LOW (ref 36.0–46.0)
Hemoglobin: 11.7 g/dL — ABNORMAL LOW (ref 12.0–15.0)
Lymphocytes Relative: 25.1 % (ref 12.0–46.0)
Lymphs Abs: 1.3 10*3/uL (ref 0.7–4.0)
MCHC: 33.4 g/dL (ref 30.0–36.0)
MCV: 88.9 fl (ref 78.0–100.0)
Monocytes Absolute: 0.5 10*3/uL (ref 0.1–1.0)
Monocytes Relative: 8.8 % (ref 3.0–12.0)
Neutro Abs: 3.1 10*3/uL (ref 1.4–7.7)
Neutrophils Relative %: 59.5 % (ref 43.0–77.0)
Platelets: 270 10*3/uL (ref 150.0–400.0)
RBC: 3.95 Mil/uL (ref 3.87–5.11)
RDW: 14.3 % (ref 11.5–15.5)
WBC: 5.3 10*3/uL (ref 4.0–10.5)

## 2020-01-05 LAB — HEMOGLOBIN A1C: Hgb A1c MFr Bld: 6.1 % (ref 4.6–6.5)

## 2020-01-05 LAB — IBC + FERRITIN
Ferritin: 85.6 ng/mL (ref 10.0–291.0)
Iron: 89 ug/dL (ref 42–145)
Saturation Ratios: 30.9 % (ref 20.0–50.0)
Transferrin: 206 mg/dL — ABNORMAL LOW (ref 212.0–360.0)

## 2020-01-05 LAB — TSH: TSH: 4.85 u[IU]/mL — ABNORMAL HIGH (ref 0.35–4.50)

## 2020-01-05 NOTE — Telephone Encounter (Signed)
ABN given to front desk for pt signature

## 2020-01-06 LAB — GLUCOSE, FASTING: Glucose, Plasma: 87 mg/dL (ref 65–99)

## 2020-01-07 ENCOUNTER — Telehealth: Payer: Self-pay | Admitting: Pain Medicine

## 2020-01-07 NOTE — Telephone Encounter (Signed)
Given we are monitoring her renal function, it is recommended for her to avoid antiinflammatories.  Would like to hold on refill.

## 2020-01-07 NOTE — Telephone Encounter (Signed)
Patient states she was doing pretty good after last procedure but is now having severe pain again. She had MRI and hip xray. She would like results and to know if Dr. Dossie Arbour saw anything that might be causing her increased pain. I set her up appt for Monday at 2:30 but she wanted me to put message back as well

## 2020-01-08 ENCOUNTER — Encounter: Payer: Self-pay | Admitting: Pain Medicine

## 2020-01-08 DIAGNOSIS — R937 Abnormal findings on diagnostic imaging of other parts of musculoskeletal system: Secondary | ICD-10-CM | POA: Insufficient documentation

## 2020-01-08 NOTE — Progress Notes (Deleted)
Patient: Wanda Martinez  Service Category: E/M  Provider: Gaspar Cola, MD  DOB: [birth year]  DOS: 01/12/2020  Location: Office  MRN: [id number]  Setting: Ambulatory outpatient  Referring Provider: Einar Pheasant, MD  Type: Established Patient  Specialty: Interventional Pain Management  PCP: Einar Pheasant, MD  Location: Remote location  Delivery: TeleHealth     Virtual Encounter - Pain Management PROVIDER NOTE: Information contained herein reflects review and annotations entered in association with encounter. Interpretation of such information and data should be left to medically-trained personnel. Information provided to patient can be located elsewhere in the medical record under "Patient Instructions". Document created using STT-dictation technology, any transcriptional errors that may result from process are unintentional.    Contact & Pharmacy Preferred: [phone] Home: [phone] (home) Mobile: [phone] (mobile) E-mail: No e-mail address on record  [place], [place] - [address] [place] [address] Phone: [phone] Fax: [phone]   Pre-screening  Wanda Martinez offered "in-person" vs "virtual" encounter. She indicated preferring virtual for this encounter.   Reason COVID-19*  Social distancing based on CDC and AMA recommendations.   I contacted Wanda Martinez on 01/12/2020 via telephone.      I clearly identified myself as Gaspar Cola, MD. I verified that I was speaking with the correct person using two identifiers (Name: Wanda Martinez, and date of birth: 1935/10/08).  Consent I sought verbal advanced consent from Wanda Martinez for virtual visit interactions. I informed Wanda Martinez of possible security and privacy concerns, risks, and limitations associated with providing "not-in-person" medical evaluation and management services. I also informed Wanda Martinez of the availability of  "in-person" appointments. Finally, I informed her that there would be a charge for the virtual visit and that she could be  personally, fully or partially, financially responsible for it. Wanda Martinez expressed understanding and agreed to proceed.   Historic Elements   Wanda Martinez is a 85 y.o. year old, female patient evaluated today after her last encounter by our practice on 01/07/2020. Wanda Martinez  has a past medical history of Acute postoperative pain (10/25/2017), Anemia, Arm pain (07/26/2015), Arthritis, Arthritis, degenerative (03/26/2014), Back pain (11/01/2013), Bladder infection (06/2018), Breast cancer ([place]), Breast cancer ([place]), CHEST PAIN (04/29/2010), Chronic cystitis, Cystocele (02/22/2013), Cystocele, midline (08/19/2013), Degeneration of intervertebral disc of lumbosacral region (03/26/2014), DYSPNEA (04/29/2010), Enthesopathy of hip (03/26/2014), GERD (gastroesophageal reflux disease), Hiatal hernia, HTN (hypertension), Hypokalemia (06/2018), Hyponatremia (06/2018), LBP (low back pain) (03/26/2014), Neck pain (11/01/2013), Parkinson disease ([place]), Pneumonia (06/2018), Sinusitis (02/07/2015), Skin lesions (07/12/2014), and Urinary incontinence. She also  has a past surgical history that includes Breast biopsy (2013); Tonsillectomy and adenoidectomy (79); Vesicovaginal fistula closure w/ TAH (1983); Breast surgery (1986); Breast enhancement surgery (1987); Breast implant removal; Breast implant removal (Right, 08/29/2012); Mastectomy (08/2012); Abdominal hysterectomy; and Colonoscopy with propofol (N/A, 09/13/2016). Wanda Martinez has a current medication list which includes the following prescription(s): amlodipine, aspirin, calcium carbonate, citalopram, cyclobenzaprine, gabapentin, glucosamine sulfate, hydrochlorothiazide, hydrocodone-acetaminophen, [START ON 01/27/2020] hydrocodone-acetaminophen, [START ON 02/26/2020] hydrocodone-acetaminophen, letrozole, losartan, lubiprostone, magnesium oxide, meloxicam,  metoprolol succinate, multivitamin, nystatin, omega-3, omeprazole, pantoprazole, and pravastatin. She  reports that she has never smoked. She has never used smokeless tobacco. She reports that she does not drink alcohol or use drugs. Wanda Martinez is allergic to sulfa antibiotics and vesicare [solifenacin].   HPI  Today, she is being contacted for  ***   Pharmacotherapy Assessment  Analgesic: Hydrocodone/APAP 5/325, 1 tab PO q  8 hrs (15 mg/day of hydrocodone) MME/day:15mg /day.   Monitoring: Pharmacotherapy: No side-effects or adverse reactions reported. [place] PMP: PDMP reviewed during this encounter.       Compliance: No problems identified. Effectiveness: Clinically acceptable. Plan: Refer to "POC".  UDS:  Summary  Date Value Ref Range Status  01/08/2019 FINAL  Final    Comment:    ==================================================================== TOXASSURE SELECT 13 (MW) ==================================================================== Test                             Result       Flag       Units Drug Present and Declared for Prescription Verification   Hydrocodone                    1944         EXPECTED   ng/mg creat   Dihydrocodeine                 194          EXPECTED   ng/mg creat   Norhydrocodone                 2532         EXPECTED   ng/mg creat    Sources of hydrocodone include scheduled prescription    medications. Dihydrocodeine and norhydrocodone are expected    metabolites of hydrocodone. Dihydrocodeine is also available as a    scheduled prescription medication. ==================================================================== Test                      Result    Flag   Units      Ref Range   Creatinine              188              mg/dL      >=20 ==================================================================== Declared Medications:  The flagging and interpretation on this report are based on the  following declared medications.  Unexpected results may  arise from  inaccuracies in the declared medications.  **Note: The testing scope of this panel includes these medications:  Hydrocodone (Hydrocodone-Acetaminophen)  **Note: The testing scope of this panel does not include following  reported medications:  Acetaminophen (Hydrocodone-Acetaminophen)  Acyclovir  Amlodipine Besylate  Aspirin  Calcium carbonate  Citalopram  Cyclobenzaprine  Gabapentin  Glucosamine  Hydrochlorothiazide  Letrozole  Losartan (Losartan Potassium)  Lubiprostone  Magnesium Oxide  Meloxicam  Metoprolol  Multivitamin  Omega-3 Fatty Acids  Omeprazole  Pantoprazole  Pravastatin ==================================================================== For clinical consultation, please call [phone]. ====================================================================    Laboratory Chemistry Profile (12 mo)  Renal: 01/08/2019: BUN/Creatinine Ratio 12 01/05/2020: BUN 23; Creatinine, Ser 1.17  Lab Results  Component Value Date   GFR 44.26 (L) 01/05/2020   GFRAA 37 (L) 07/24/2019   GFRNONAA 32 (L) 07/24/2019   Hepatic: 10/17/2019: Albumin 4.4 Lab Results  Component Value Date   AST 28 10/17/2019   ALT 20 10/17/2019   Other: No results found for requested labs within last 8760 hours.  Note: Above Lab results reviewed.  Imaging  DG HIP UNILAT W OR W/O PELVIS 2-3 VIEWS RIGHT CLINICAL DATA:  Right hip pain, chronic, worsening  EXAM: DG HIP (WITH OR WITHOUT PELVIS) 2-3V RIGHT  COMPARISON:  02/23/2012 CT abdomen/pelvis  FINDINGS: No pelvic fracture or diastasis. No right hip fracture or dislocation. No suspicious focal osseous lesions. No significant right hip arthropathy.  Degenerative changes in the visualized lower lumbar spine.  IMPRESSION: No acute osseous abnormality. No right hip malalignment. No significant right hip arthropathy.  Degenerative changes in the visualized lower lumbar spine.  Electronically Signed   By: Ilona Sorrel  M.D.   On: 12/30/2019 17:23 MR Lumbar Spine w/o contrast CLINICAL DATA:  Low back pain extending to the right buttock and lower extremity.  EXAM: MRI LUMBAR SPINE WITHOUT CONTRAST  TECHNIQUE: Multiplanar, multisequence MR imaging of the lumbar spine was performed. No intravenous contrast was administered.  COMPARISON:  05/17/2017  FINDINGS: Segmentation:  5 lumbar type vertebral bodies.  Alignment:  Curvature convex to the right with the apex at L2.  Vertebrae: No fracture or primary bone lesion. Inferior endplate Schmorl's node on the left at L2 with some edema.  Conus medullaris and cauda equina: Conus extends to the L1 level. Conus and cauda equina appear normal.  Paraspinal and other soft tissues: Negative  Disc levels:  T11-12, T12-L1 and L1-2: Mild noncompressive disc bulges.  L2-3: Disc degeneration more pronounced on the left. No canal stenosis. Foraminal narrowing on the left that would have some potential to affect the exiting L2 nerve. This has worsened slightly since 2018.  L3-4: Endplate osteophytes and bulging of the disc. Mild facet hypertrophy. Mild stenosis of the right lateral recess but without gross neural compression.  L4-5: Endplate osteophytes and bulging of the disc. Facet degeneration and hypertrophy on the right. Mild narrowing of the intervertebral foramen on the right, not grossly compressive.  L5-S1: Bulging of the disc more prominent towards the right. Facet degeneration and hypertrophy more pronounced on the right. Foraminal narrowing on the right that could possibly affect the exiting L5 nerve. This has worsened slightly since 2018.  IMPRESSION: Curvature convex to the right with the apex at L2 as seen previously.  Slight worsening of disc degeneration on the left side at L2-3. L2 Schmorl's node with edema that could contribute to back pain. Left foraminal encroachment secondary to osteophytes and bulging disc material would  have some potential to affect the exiting L2 nerve.  Slight worsening of degenerative changes on the right at L5-S1. Bulging of the disc. Facet degeneration and hypertrophy. Right foraminal narrowing that could possibly affect the right L5 nerve. The facet arthropathy could also be a cause of back pain or referred facet syndrome pain. Lesser facet osteoarthritis on the left.  Electronically Signed   By: Nelson Chimes M.D.   On: 12/30/2019 15:27   Assessment  There were no encounter diagnoses.  Plan of Care  Problem-specific:  No problem-specific Assessment & Plan notes found for this encounter.  I am having Romelia T. Mattoon maintain her aspirin, calcium carbonate, multivitamin, lubiprostone, GLUCOSAMINE SULFATE PO, Omega-3, citalopram, omeprazole, gabapentin, letrozole, hydrochlorothiazide, metoprolol succinate, cyclobenzaprine, meloxicam, pantoprazole, amLODipine, losartan, pravastatin, nystatin, HYDROcodone-acetaminophen, HYDROcodone-acetaminophen, magnesium oxide, and HYDROcodone-acetaminophen.  Pharmacotherapy (Medications Ordered): No orders of the defined types were placed in this encounter.  Orders:  No orders of the defined types were placed in this encounter.  Follow-up plan:   No follow-ups on file.      Interventional treatment options:  Under consideration: Diagnosticbilateral cervical facet block  Possiblebilateral cervical facet RFA    Therapeutic/palliative (PRN): Diagnostic right CESI #2  Palliative bilateral trochanteric bursa injection #2  Diagnosticright IA hip joint injection #2  Palliative right lumbar facet block #7  Palliative left lumbar facet block #4  Palliative right lumbar facet RFA #3(last done06/18/19) Palliative left lumbar facet RFA #2 (last done  02/12/2018) Diagnostic/Therapeutic right SI block #2  Diagnostic/Therapeutic left SI block #2  Palliative right S-I RFA #4 (last done 06/05/2019) Palliative right suprascapular muscle TPI/MNB  #3  Palliativeright L3-4 LESI#3 Palliative right L5 TFESI #4 Diagnostic right L5-S1 LESI #3      Recent Visits Date Type Provider Dept  12/22/19 Telemedicine Milinda Pointer, MD [facility]  12/04/19 Procedure visit Milinda Pointer, MD [facility]  11/26/19 Telemedicine Milinda Pointer, MD [facility]  11/20/19 Procedure visit Milinda Pointer, MD [facility]  11/06/19 Procedure visit Milinda Pointer, MD [facility]  Showing recent visits within past 90 days and meeting all other requirements   Future Appointments Date Type Provider Dept  01/12/20 Appointment Milinda Pointer, MD [facility]  03/24/20 Appointment Milinda Pointer, MD [facility]  Showing future appointments within next 90 days and meeting all other requirements   I discussed the assessment and treatment plan with the patient. The patient was provided an opportunity to ask questions and all were answered. The patient agreed with the plan and demonstrated an understanding of the instructions.  Patient advised to call back or seek an in-person evaluation if the symptoms or condition worsens.  Duration of encounter: *** minutes.  Note by: Gaspar Cola, MD Date: 01/12/2020; Time: 10:27 AM

## 2020-01-08 NOTE — Telephone Encounter (Signed)
MRI and x-ray results read to patient.

## 2020-01-12 ENCOUNTER — Ambulatory Visit: Admission: RE | Admit: 2020-01-12 | Payer: Medicare Other | Source: Ambulatory Visit

## 2020-01-12 ENCOUNTER — Telehealth (HOSPITAL_BASED_OUTPATIENT_CLINIC_OR_DEPARTMENT_OTHER): Payer: Medicare Other | Admitting: Pain Medicine

## 2020-01-12 DIAGNOSIS — G894 Chronic pain syndrome: Secondary | ICD-10-CM

## 2020-01-13 ENCOUNTER — Ambulatory Visit: Payer: Medicare Other | Attending: Pain Medicine | Admitting: Pain Medicine

## 2020-01-13 ENCOUNTER — Encounter: Payer: Self-pay | Admitting: Pain Medicine

## 2020-01-13 ENCOUNTER — Ambulatory Visit: Payer: Medicare Other | Admitting: Pain Medicine

## 2020-01-13 ENCOUNTER — Other Ambulatory Visit: Payer: Self-pay

## 2020-01-13 ENCOUNTER — Telehealth: Payer: Self-pay

## 2020-01-13 DIAGNOSIS — G8929 Other chronic pain: Secondary | ICD-10-CM

## 2020-01-13 DIAGNOSIS — R937 Abnormal findings on diagnostic imaging of other parts of musculoskeletal system: Secondary | ICD-10-CM | POA: Diagnosis not present

## 2020-01-13 DIAGNOSIS — M51379 Other intervertebral disc degeneration, lumbosacral region without mention of lumbar back pain or lower extremity pain: Secondary | ICD-10-CM

## 2020-01-13 DIAGNOSIS — M4726 Other spondylosis with radiculopathy, lumbar region: Secondary | ICD-10-CM | POA: Diagnosis not present

## 2020-01-13 DIAGNOSIS — M5137 Other intervertebral disc degeneration, lumbosacral region: Secondary | ICD-10-CM

## 2020-01-13 DIAGNOSIS — M79604 Pain in right leg: Secondary | ICD-10-CM

## 2020-01-13 DIAGNOSIS — M47816 Spondylosis without myelopathy or radiculopathy, lumbar region: Secondary | ICD-10-CM

## 2020-01-13 DIAGNOSIS — M25551 Pain in right hip: Secondary | ICD-10-CM | POA: Diagnosis not present

## 2020-01-13 NOTE — Telephone Encounter (Signed)
Spoke with patient.  States that she had 100% relief after her last procedure for approximately 3 weeks then pain began to come back in the back gradually.  Started feeling pain in her back that then radiated down the back of the leg causing terrible pain in her right calf.  Denies redness, swelling, heat in right calf.  States the pain occasionally will involve the last 2 toes.  She states that the worse pain is still in the right calf.

## 2020-01-13 NOTE — Progress Notes (Signed)
Pain relief after procedure (treated area only): (Questions asked to patient) 1. Starting about 15 minutes after the procedure, and "while the area was still numb" (from the local anesthetics), were you having any of your usual pain "in that area" (the treated area)?  (NOTE: NOT including the discomfort from the needle sticks.) First 1 hour: 100 % better. First 4-6 hours: 100 % better.  3. How much better is your pain now, when compared to before the procedure? Current benefit: 100 % better.Lastiong 3 weeks 4. Can you move better now? Improvement in ROM (Range of Motion): No. 5. Can you do more now? Improvement in function: No. 4. Did you have any problems with the procedure? Side-effects/Complications: No.

## 2020-01-13 NOTE — Progress Notes (Signed)
Patient: Wanda Martinez  Service Category: E/M  Provider: Gaspar Cola, MD  DOB: 1935/03/19  DOS: 01/13/2020  Location: Office  MRN: [id number]  Setting: Ambulatory outpatient  Referring Provider: Einar Pheasant, MD  Type: Established Patient  Specialty: Interventional Pain Management  PCP: Einar Pheasant, MD  Location: Remote location  Delivery: TeleHealth     Virtual Encounter - Pain Management PROVIDER NOTE: Information contained herein reflects review and annotations entered in association with encounter. Interpretation of such information and data should be left to medically-trained personnel. Information provided to patient can be located elsewhere in the medical record under "Patient Instructions". Document created using STT-dictation technology, any transcriptional errors that may result from process are unintentional.    Contact & Pharmacy Preferred: [phone] Home: [phone] (home) Mobile: [phone] (mobile) E-mail: No e-mail address on record  [place], [place] - [address] [place] [address] Phone: [phone] Fax: [phone]   Pre-screening  Wanda Martinez offered "in-person" vs "virtual" encounter. She indicated preferring virtual for this encounter.   Reason COVID-19*  Social distancing based on CDC and AMA recommendations.   I contacted Wanda Martinez on 01/13/2020 via telephone.      I clearly identified myself as Gaspar Cola, MD. I verified that I was speaking with the correct person using two identifiers (Name: Artrice Stmary, and date of birth: Dec 06, 1935).  Consent I sought verbal advanced consent from Wanda Martinez for virtual visit interactions. I informed Wanda Martinez of possible security and privacy concerns, risks, and limitations associated with providing "not-in-person" medical evaluation and management services. I also informed Wanda Martinez of the availability of  "in-person" appointments. Finally, I informed her that there would be a charge for the virtual visit and that she could be  personally, fully or partially, financially responsible for it. Wanda Martinez expressed understanding and agreed to proceed.   Historic Elements   Wanda Martinez is a 85 y.o. year old, female patient evaluated today after her last encounter by our practice on 01/13/2020. Wanda Martinez  has a past medical history of Acute postoperative pain (10/25/2017), Anemia, Arm pain (07/26/2015), Arthritis, Arthritis, degenerative (03/26/2014), Back pain (11/01/2013), Bladder infection (06/2018), Breast cancer ([place]), Breast cancer ([place]), CHEST PAIN (04/29/2010), Chronic cystitis, Cystocele (02/22/2013), Cystocele, midline (08/19/2013), Degeneration of intervertebral disc of lumbosacral region (03/26/2014), DYSPNEA (04/29/2010), Enthesopathy of hip (03/26/2014), GERD (gastroesophageal reflux disease), Hiatal hernia, HTN (hypertension), Hypokalemia (06/2018), Hyponatremia (06/2018), LBP (low back pain) (03/26/2014), Neck pain (11/01/2013), Parkinson disease ([place]), Pneumonia (06/2018), Sinusitis (02/07/2015), Skin lesions (07/12/2014), and Urinary incontinence. She also  has a past surgical history that includes Breast biopsy (2013); Tonsillectomy and adenoidectomy (79); Vesicovaginal fistula closure w/ TAH (1983); Breast surgery (1986); Breast enhancement surgery (1987); Breast implant removal; Breast implant removal (Right, 08/29/2012); Mastectomy (08/2012); Abdominal hysterectomy; and Colonoscopy with propofol (N/A, 09/13/2016). Wanda Martinez has a current medication list which includes the following prescription(s): amlodipine, aspirin, calcium carbonate, citalopram, cyclobenzaprine, gabapentin, glucosamine sulfate, hydrochlorothiazide, hydrocodone-acetaminophen, [START ON 01/27/2020] hydrocodone-acetaminophen, [START ON 02/26/2020] hydrocodone-acetaminophen, letrozole, losartan, lubiprostone, magnesium oxide, meloxicam,  metoprolol succinate, multivitamin, nystatin, omega-3, omeprazole, pantoprazole, and pravastatin. She  reports that she has never smoked. She has never used smokeless tobacco. She reports that she does not drink alcohol or use drugs. Wanda Martinez is allergic to sulfa antibiotics and vesicare [solifenacin].   HPI  Today, she is being contacted for worsening of previously known (established) problem.  The patient was scheduled to come in  today for a procedure to help her with her right leg pain, but she canceled due to some low-grade fever.  She indicates that she thinks she is having a urinary tract infection.  She has a history of recurrent UTIs.  She denies having any antibiotics at home.  She also indicates not having talked to her PCP about this.  She is complaining of some burning on urination.  With regards to her pain, she indicates having bilateral hip pain with the right being worse than the left, primarily when she stands up for any given.  Time.  She is also having pain going down the right lower extremity or the area of the thigh and calf.  She denies any numbness.  She indicates her pain worse from the hip down to the calf but she denies any changes in color or temperature of the leg.  The x-rays of her left hip show no acute changes and the only thing that they report on the x-rays is seen degenerative changes in the lumbar spine.  Review of the lumbar MRI shows worsening on the right side at the L5-S1 level.  There is also worsening at the L2-3 level, but it is primarily in the left side.  Since the patient's symptoms are only on the right side, I would have to assume that the problem is primarily that of the L5-S1.  Post-Procedure Evaluation  Procedure: Therapeutic right-sided L5 TFESI #3 + right-sided L5-S1 LESI #2 under fluoroscopic guidance and IV sedation Pre-procedure pain level:  7/10 Post-procedure: 0/10 (100% relief)  Sedation: Sedation provided.  Dewayne Shorter, RN  01/13/2020  9:57  AM  Sign when Signing Visit Pain relief after procedure (treated area only): (Questions asked to patient) 1. Starting about 15 minutes after the procedure, and "while the area was still numb" (from the local anesthetics), were you having any of your usual pain "in that area" (the treated area)?  (NOTE: NOT including the discomfort from the needle sticks.) First 1 hour: 100 % better. First 4-6 hours: 100 % better.  3. How much better is your pain now, when compared to before the procedure? Current benefit: 100 % better.Lastiong 3 weeks 4. Can you move better now? Improvement in ROM (Range of Motion): No. 5. Can you do more now? Improvement in function: No. 4. Did you have any problems with the procedure? Side-effects/Complications: No.  Current benefits: Defined as benefit that persist at this time.   Analgesia:  90-100% better Function: No improvement ROM: No improvement  Pharmacotherapy Assessment  Analgesic: Hydrocodone/APAP 5/325, 1 tab PO q 8 hrs (15 mg/day of hydrocodone) MME/day:15mg /day.   Monitoring: Pharmacotherapy: No side-effects or adverse reactions reported.  PMP: PDMP not reviewed this encounter.       Compliance: No problems identified. Effectiveness: Clinically acceptable. Plan: Refer to "POC".  UDS:  Summary  Date Value Ref Range Status  01/08/2019 FINAL  Final    Comment:    ==================================================================== TOXASSURE SELECT 13 (MW) ==================================================================== Test                             Result       Flag       Units Drug Present and Declared for Prescription Verification   Hydrocodone                    1944         EXPECTED   ng/mg creat  Dihydrocodeine                 194          EXPECTED   ng/mg creat   Norhydrocodone                 2532         EXPECTED   ng/mg creat    Sources of hydrocodone include scheduled prescription    medications. Dihydrocodeine and  norhydrocodone are expected    metabolites of hydrocodone. Dihydrocodeine is also available as a    scheduled prescription medication. ==================================================================== Test                      Result    Flag   Units      Ref Range   Creatinine              188              mg/dL      >=20 ==================================================================== Declared Medications:  The flagging and interpretation on this report are based on the  following declared medications.  Unexpected results may arise from  inaccuracies in the declared medications.  **Note: The testing scope of this panel includes these medications:  Hydrocodone (Hydrocodone-Acetaminophen)  **Note: The testing scope of this panel does not include following  reported medications:  Acetaminophen (Hydrocodone-Acetaminophen)  Acyclovir  Amlodipine Besylate  Aspirin  Calcium carbonate  Citalopram  Cyclobenzaprine  Gabapentin  Glucosamine  Hydrochlorothiazide  Letrozole  Losartan (Losartan Potassium)  Lubiprostone  Magnesium Oxide  Meloxicam  Metoprolol  Multivitamin  Omega-3 Fatty Acids  Omeprazole  Pantoprazole  Pravastatin ==================================================================== For clinical consultation, please call [phone]. ====================================================================    Laboratory Chemistry Profile (12 mo)  Renal: 01/05/2020: BUN 23; Creatinine, Ser 1.17  Lab Results  Component Value Date   GFR 44.26 (L) 01/05/2020   GFRAA 37 (L) 07/24/2019   GFRNONAA 32 (L) 07/24/2019   Hepatic: 10/17/2019: Albumin 4.4 Lab Results  Component Value Date   AST 28 10/17/2019   ALT 20 10/17/2019   Other: No results found for requested labs within last 8760 hours.  Note: Above Lab results reviewed.  Imaging  DG HIP UNILAT W OR W/O PELVIS 2-3 VIEWS RIGHT CLINICAL DATA:  Right hip pain, chronic, worsening  EXAM: DG HIP (WITH OR  WITHOUT PELVIS) 2-3V RIGHT  COMPARISON:  02/23/2012 CT abdomen/pelvis  FINDINGS: No pelvic fracture or diastasis. No right hip fracture or dislocation. No suspicious focal osseous lesions. No significant right hip arthropathy. Degenerative changes in the visualized lower lumbar spine.  IMPRESSION: No acute osseous abnormality. No right hip malalignment. No significant right hip arthropathy.  Degenerative changes in the visualized lower lumbar spine.  Electronically Signed   By: Ilona Sorrel M.D.   On: 12/30/2019 17:23  MR Lumbar Spine w/o contrast CLINICAL DATA:  Low back pain extending to the right buttock and lower extremity.  EXAM: MRI LUMBAR SPINE WITHOUT CONTRAST  TECHNIQUE: Multiplanar, multisequence MR imaging of the lumbar spine was performed. No intravenous contrast was administered.  COMPARISON:  05/17/2017  FINDINGS: Segmentation:  5 lumbar type vertebral bodies.  Alignment:  Curvature convex to the right with the apex at L2.  Vertebrae: No fracture or primary bone lesion. Inferior endplate Schmorl's node on the left at L2 with some edema.  Conus medullaris and cauda equina: Conus extends to the L1 level. Conus and cauda equina appear normal.  Paraspinal and other soft tissues:  Negative  Disc levels:  T11-12, T12-L1 and L1-2: Mild noncompressive disc bulges.  L2-3: Disc degeneration more pronounced on the left. No canal stenosis. Foraminal narrowing on the left that would have some potential to affect the exiting L2 nerve. This has worsened slightly since 2018.  L3-4: Endplate osteophytes and bulging of the disc. Mild facet hypertrophy. Mild stenosis of the right lateral recess but without gross neural compression.  L4-5: Endplate osteophytes and bulging of the disc. Facet degeneration and hypertrophy on the right. Mild narrowing of the intervertebral foramen on the right, not grossly compressive.  L5-S1: Bulging of the disc more prominent  towards the right. Facet degeneration and hypertrophy more pronounced on the right. Foraminal narrowing on the right that could possibly affect the exiting L5 nerve. This has worsened slightly since 2018.  IMPRESSION: Curvature convex to the right with the apex at L2 as seen previously.  Slight worsening of disc degeneration on the left side at L2-3. L2 Schmorl's node with edema that could contribute to back pain. Left foraminal encroachment secondary to osteophytes and bulging disc material would have some potential to affect the exiting L2 nerve.  Slight worsening of degenerative changes on the right at L5-S1. Bulging of the disc. Facet degeneration and hypertrophy. Right foraminal narrowing that could possibly affect the right L5 nerve. The facet arthropathy could also be a cause of back pain or referred facet syndrome pain. Lesser facet osteoarthritis on the left.  Electronically Signed   By: Nelson Chimes M.D.   On: 12/30/2019 15:27   Assessment  The primary encounter diagnosis was Chronic lower extremity pain (Secondary Area of Pain) (Right). Diagnoses of Chronic lumbar radicular pain (S1) (Right), DDD (degenerative disc disease), lumbosacral, Abnormal MRI, lumbar spine (12/30/2019), Chronic hip pain (Right), and Lumbar facet syndrome (Bilateral) (R>L) were also pertinent to this visit.  Plan of Care  Problem-specific:  No problem-specific Assessment & Plan notes found for this encounter.  I am having Vermell T. Imondi maintain her aspirin, calcium carbonate, multivitamin, lubiprostone, GLUCOSAMINE SULFATE PO, Omega-3, citalopram, omeprazole, gabapentin, letrozole, hydrochlorothiazide, metoprolol succinate, cyclobenzaprine, meloxicam, pantoprazole, amLODipine, losartan, pravastatin, nystatin, HYDROcodone-acetaminophen, HYDROcodone-acetaminophen, magnesium oxide, and HYDROcodone-acetaminophen.  Pharmacotherapy (Medications Ordered): No orders of the defined types were placed in  this encounter.  Orders:  Orders Placed This Encounter  Procedures  . LUMBAR FACET(MEDIAL BRANCH NERVE BLOCK) MBNB    Standing Status:   Future    Standing Expiration Date:   02/14/2020    Scheduling Instructions:     Procedure: Lumbar facet block (AKA.: Lumbosacral medial branch nerve block)     Side: Right-sided     Level: L3-4, L4-5, & L5-S1 Facets (L2, L3, L4, L5, & S1 Medial Branch Nerves)     Sedation: Patient's choice.     Timeframe: ASAA    Order Specific Question:   Where will this procedure be performed?    Answer:   [facility]  . Lumbar Epidural Injection    Standing Status:   Future    Standing Expiration Date:   02/14/2020    Scheduling Instructions:     Procedure: Interlaminar Lumbar Epidural Steroid injection (LESI)  L5-S1     Laterality: Right-sided     Sedation: Patient's choice.     Timeframe: ASAA    Order Specific Question:   Where will this procedure be performed?    Answer:   [facility]  . HIP INJECTION    Standing Status:   Future    Standing Expiration  Date:   02/13/2020    Scheduling Instructions:     Side: Right-sided     Sedation: With Sedation.     Timeframe: As soon as schedule allows   Follow-up plan:   Return for Procedure (w/ sedation): (R) LESI vs FCT vs Hip Blk.      Interventional treatment options:  Under consideration: Diagnosticbilateral cervical facet block  Possiblebilateral cervical facet RFA  Palliative right-sided L5 TFESI #4 + right-sided L4-5 LESI #1 vs  Diagnostic right IA hip injection #2 vs Palliative right lumbar facet #7    Therapeutic/palliative (PRN): Diagnostic right CESI #2  Palliative bilateral trochanteric bursa injection #2  Diagnosticright IA hip joint injection #2  Palliative right lumbar facet block #7  Palliative left lumbar facet block #4  Palliative right lumbar facet RFA #3(last done06/18/19) Palliative left lumbar facet RFA #2 (last done 02/12/2018) Diagnostic/Therapeutic  right SI block #2  Diagnostic/Therapeutic left SI block #2  Palliative right S-I RFA #4 (last done 06/05/2019) Palliative right suprascapular muscle TPI/MNB #3  Palliativeright L3-4 LESI#3 Palliative right L5 TFESI #4 Diagnostic right L5-S1 LESI #3     Recent Visits Date Type Provider Dept  01/13/20 Telemedicine Milinda Pointer, [facility]  12/22/19 Telemedicine Milinda Pointer, [facility]  12/04/19 Procedure visit Milinda Pointer, MD [facility]  11/26/19 Telemedicine Milinda Pointer, MD [facility]  11/20/19 Procedure visit Milinda Pointer, MD [facility]  11/06/19 Procedure visit Milinda Pointer, MD [facility]  Showing recent visits within past 90 days and meeting all other requirements   Future Appointments Date Type Provider Dept  03/24/20 Appointment Milinda Pointer, MD [facility]  Showing future appointments within next 90 days and meeting all other requirements   I discussed the assessment and treatment plan with the patient. The patient was provided an opportunity to ask questions and all were answered. The patient agreed with the plan and demonstrated an understanding of the instructions.  Patient advised to call back or seek an in-person evaluation if the symptoms or condition worsens.  Duration of encounter: 15 minutes.  Note by: Gaspar Cola, MD Date: 01/13/2020; Time: 8:40 AM

## 2020-01-14 NOTE — Patient Instructions (Signed)

## 2020-01-23 NOTE — Progress Notes (Signed)
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  INTERVAL HISTORY: Patient returns to clinic today for further evaluation and continuation of Prolia.  She currently feels well and is at her baseline.  She has no new neurologic complaints.  She denies any recent fevers or illnesses.  She has a good appetite and denies weight loss.  She denies any chest pain, shortness of breath, cough, or hemoptysis.  She denies any nausea, vomiting, constipation, or diarrhea.  She has no urinary complaints.  Patient offers no specific complaints today.  REVIEW OF SYSTEMS:   Review of Systems  Constitutional: Negative.  Negative for fever, malaise/fatigue and weight loss.  Respiratory: Negative.  Negative for cough and shortness of breath.   Cardiovascular: Negative.  Negative for chest pain and leg swelling.  Gastrointestinal: Negative.  Negative for abdominal pain.  Genitourinary: Negative.   Musculoskeletal: Negative.  Negative for back pain and joint pain.  Skin: Negative.  Negative for rash.  Neurological: Positive for tremors. Negative for sensory change, speech change, focal weakness and weakness.  Psychiatric/Behavioral: Negative.  Negative for depression. The patient is not nervous/anxious.     As per HPI. Otherwise, a complete review of systems is negative.  PAST MEDICAL HISTORY: Past Medical History:  Diagnosis Date  . Acute postoperative pain 10/25/2017  . Anemia   . Arm pain 07/26/2015  . Arthritis   . Arthritis, degenerative 03/26/2014  . Back pain 11/01/2013  . Bladder infection 06/2018  . Breast cancer ([place])    Masectomy - left - 1986   . Breast cancer [facility]     Mastectomy-right -2014  . CHEST PAIN 04/29/2010   Qualifier: Diagnosis of  By: Wynetta Emery RN, Doroteo Bradford    . Chronic cystitis   . Cystocele 02/22/2013  . Cystocele, midline 08/19/2013  . Degeneration of intervertebral disc of lumbosacral region 03/26/2014  . DYSPNEA 04/29/2010   Qualifier: Diagnosis of  By: Wynetta Emery RN, Doroteo Bradford    . Enthesopathy of hip 03/26/2014  . GERD (gastroesophageal reflux disease)   . Hiatal hernia   . HTN (hypertension)   . Hypokalemia 06/2018  . Hyponatremia 06/2018  . LBP (low back pain) 03/26/2014  . Neck pain 11/01/2013  . Parkinson disease ([place])   . Pneumonia 06/2018  . Sinusitis 02/07/2015  . Skin lesions 07/12/2014  . Urinary incontinence    mixed     PAST SURGICAL HISTORY: Past Surgical History:  Procedure Laterality Date  . ABDOMINAL HYSTERECTOMY    . BREAST BIOPSY  2013  . BREAST ENHANCEMENT SURGERY  1987  . BREAST IMPLANT REMOVAL    . BREAST IMPLANT REMOVAL Right 08/29/2012  . BREAST SURGERY  1986   s/p left mastectomy  . COLONOSCOPY WITH PROPOFOL N/A 09/13/2016   Procedure: COLONOSCOPY WITH PROPOFOL;  Surgeon: Manya Silvas, MD;  Location: [facility] ENDOSCOPY;  Service: Endoscopy;  Laterality: N/A;  . MASTECTOMY  08/2012   right  . TONSILLECTOMY AND ADENOIDECTOMY  79  . VESICOVAGINAL FISTULA CLOSURE W/ TAH  1983    FAMILY HISTORY Family History  Problem Relation Age of Onset  . Diabetes Father   . Stroke Father   . Colon polyps Father   . Stroke Mother   . Parkinson's disease Mother  ADVANCED DIRECTIVES:    HEALTH MAINTENANCE: Social History   Tobacco Use  . Smoking status: Never Smoker  . Smokeless tobacco: Never Used  . Tobacco comment: tobacco use - no  Substance Use Topics  . Alcohol use: No    Alcohol/week: 0.0 standard drinks  . Drug use: No     Allergies  Allergen Reactions  . Sulfa Antibiotics Nausea Only  . Vesicare [Solifenacin] Other (See Comments)    Constipation Constipation    Current Outpatient Medications    Medication Sig Dispense Refill  . amLODipine (NORVASC) 2.5 MG tablet TAKE 1 TABLET BY MOUTH ONCE DAILY. 90 tablet 3  . aspirin 81 MG EC tablet Take 81 mg by mouth daily as needed.      . Calcium Carbonate (CALCIUM 600) 1500 MG TABS Take 1 tablet by mouth daily.      . citalopram (CELEXA) 20 MG tablet TAKE 1 & 1/2 TABLETS BY MOUTH ONCE DAILY    . cyclobenzaprine (FLEXERIL) 10 MG tablet TAKE ONE TABLET BY MOUTH AT BEDTIME. 30 tablet 11  . gabapentin (NEURONTIN) 100 MG capsule Take 200 mg by mouth 3 (three) times daily.   1  . GLUCOSAMINE SULFATE PO Take by mouth daily.     . hydrochlorothiazide (HYDRODIURIL) 25 MG tablet TAKE 1 TABLET BY MOUTH ONCE DAILY. (PATIENT TAKES 1/2 ONCE DAILY AND EXTRA 1/2 IF NEEDED) 15 tablet 10  . HYDROcodone-acetaminophen (NORCO/VICODIN) 5-325 MG tablet Take 1 tablet by mouth every 8 (eight) hours as needed for severe pain. Must last 30 days 90 tablet 0  . letrozole (FEMARA) 2.5 MG tablet Take 2.5 mg by mouth daily.    Marland Kitchen losartan (COZAAR) 50 MG tablet TAKE (2) TABLETS BY MOUTH ONCE DAILY. 180 tablet 3  . lubiprostone (AMITIZA) 24 MCG capsule TAKE 1 CAPSULE BY MOUTH ONCE DAILY WITH BREAKFAST.    . magnesium oxide (MAG-OX) 400 (241.3 Mg) MG tablet TAKE 1 TABLET BY MOUTH ONCE DAILY. 30 tablet 11  . meloxicam (MOBIC) 7.5 MG tablet TAKE 1 TABLET BY MOUTH ONCE DAILY. 30 tablet 4  . metoprolol succinate (TOPROL-XL) 25 MG 24 hr tablet TAKE 1 TABLET BY MOUTH TWICE DAILY 60 tablet 10  . Multiple Vitamin (MULTIVITAMIN) tablet Take 1 tablet by mouth daily.    Marland Kitchen nystatin (MYCOSTATIN) 100000 UNIT/ML suspension Swish and spit tid 60 mL 0  . Omega-3 1000 MG CAPS Take by mouth daily.     Marland Kitchen omeprazole (PRILOSEC) 20 MG capsule TAKE 1 CAPSULE BY MOUTH TWICE DAILY FOR REFLUX. 60 capsule 1  . pantoprazole (PROTONIX) 40 MG tablet TAKE 1 TABLET BY MOUTH ONCE DAILY. 90 tablet 3  . pravastatin (PRAVACHOL) 20 MG tablet TAKE 1 TABLET BY MOUTH ONCE DAILY. 90 tablet 3  .  HYDROcodone-acetaminophen (NORCO/VICODIN) 5-325 MG tablet Take 1 tablet by mouth every 8 (eight) hours as needed for severe pain. Must last 30 days (Patient not taking: Reported on 01/27/2020) 90 tablet 0  . [START ON 02/26/2020] HYDROcodone-acetaminophen (NORCO/VICODIN) 5-325 MG tablet Take 1 tablet by mouth every 8 (eight) hours as needed for severe pain. Must last 30 days (Patient not taking: Reported on 01/27/2020) 90 tablet 0   No current facility-administered medications for this visit.    OBJECTIVE: Vitals:   01/27/20 1047  BP: 138/82  Pulse: 62  Resp: 18  Temp: 99.2 F (37.3 C)  SpO2: 100%     Body mass index is 32.1 kg/m.    ECOG FS:0 - Asymptomatic  General: Well-developed, well-nourished, no  acute distress. Eyes: Pink conjunctiva, anicteric sclera. HEENT: Normocephalic, moist mucous membranes. Breast: Bilateral mastectomies without evidence of recurrence. Lungs: No audible wheezing or coughing. Heart: Regular rate and rhythm. Abdomen: Soft, nontender, no obvious distention. Musculoskeletal: No edema, cyanosis, or clubbing. Neuro: Alert, answering all questions appropriately. Cranial nerves grossly intact. Skin: No rashes or petechiae noted. Psych: Normal affect.   LAB RESULTS:  Lab Results  Component Value Date   NA 135 01/27/2020   K 4.9 01/27/2020   CL 101 01/27/2020   CO2 27 01/27/2020   GLUCOSE 82 01/27/2020   BUN 26 (H) 01/27/2020   CREATININE 1.02 (H) 01/27/2020   CALCIUM 9.2 01/27/2020   PROT 7.6 10/17/2019   PROT 7.3 10/17/2019   ALBUMIN 4.4 10/17/2019   AST 28 10/17/2019   ALT 20 10/17/2019   ALKPHOS 50 10/17/2019   BILITOT 0.4 10/17/2019   GFRNONAA 51 (L) 01/27/2020   GFRAA 59 (L) 01/27/2020    Lab Results  Component Value Date   WBC 5.3 01/05/2020   NEUTROABS 3.1 01/05/2020   HGB 11.7 (L) 01/05/2020   HCT 35.1 (L) 01/05/2020   MCV 88.9 01/05/2020   PLT 270.0 01/05/2020     STUDIES: MR Lumbar Spine w/o contrast  Result Date:  12/30/2019 CLINICAL DATA:  Low back pain extending to the right buttock and lower extremity. EXAM: MRI LUMBAR SPINE WITHOUT CONTRAST TECHNIQUE: Multiplanar, multisequence MR imaging of the lumbar spine was performed. No intravenous contrast was administered. COMPARISON:  05/17/2017 FINDINGS: Segmentation:  5 lumbar type vertebral bodies. Alignment:  Curvature convex to the right with the apex at L2. Vertebrae: No fracture or primary bone lesion. Inferior endplate Schmorl's node on the left at L2 with some edema. Conus medullaris and cauda equina: Conus extends to the L1 level. Conus and cauda equina appear normal. Paraspinal and other soft tissues: Negative Disc levels: T11-12, T12-L1 and L1-2: Mild noncompressive disc bulges. L2-3: Disc degeneration more pronounced on the left. No canal stenosis. Foraminal narrowing on the left that would have some potential to affect the exiting L2 nerve. This has worsened slightly since 2018. L3-4: Endplate osteophytes and bulging of the disc. Mild facet hypertrophy. Mild stenosis of the right lateral recess but without gross neural compression. L4-5: Endplate osteophytes and bulging of the disc. Facet degeneration and hypertrophy on the right. Mild narrowing of the intervertebral foramen on the right, not grossly compressive. L5-S1: Bulging of the disc more prominent towards the right. Facet degeneration and hypertrophy more pronounced on the right. Foraminal narrowing on the right that could possibly affect the exiting L5 nerve. This has worsened slightly since 2018. IMPRESSION: Curvature convex to the right with the apex at L2 as seen previously. Slight worsening of disc degeneration on the left side at L2-3. L2 Schmorl's node with edema that could contribute to back pain. Left foraminal encroachment secondary to osteophytes and bulging disc material would have some potential to affect the exiting L2 nerve. Slight worsening of degenerative changes on the right at L5-S1.  Bulging of the disc. Facet degeneration and hypertrophy. Right foraminal narrowing that could possibly affect the right L5 nerve. The facet arthropathy could also be a cause of back pain or referred facet syndrome pain. Lesser facet osteoarthritis on the left. Electronically Signed   By: Nelson Chimes M.D.   On: 12/30/2019 15:27   DG HIP UNILAT W OR W/O PELVIS 2-3 VIEWS RIGHT  Result Date: 12/30/2019 CLINICAL DATA:  Right hip pain, chronic, worsening EXAM: DG HIP (WITH OR  WITHOUT PELVIS) 2-3V RIGHT COMPARISON:  02/23/2012 CT abdomen/pelvis FINDINGS: No pelvic fracture or diastasis. No right hip fracture or dislocation. No suspicious focal osseous lesions. No significant right hip arthropathy. Degenerative changes in the visualized lower lumbar spine. IMPRESSION: No acute osseous abnormality. No right hip malalignment. No significant right hip arthropathy. Degenerative changes in the visualized lower lumbar spine. Electronically Signed   By: Ilona Sorrel M.D.   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  PLAN:    1.  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  Patient completed 5 years of letrozole in May 2019.  No further interventions are needed.  Patient has had bilateral mastectomies, therefore no further mammograms are necessary.  Return to clinic as scheduled for Prolia. 2.  Osteoporosis: Patient's most recent bone mineral density on July 01, 2019 revealed a T score of -3.0 which was significantly worse than 1 year prior where her T score was reported -2.6.  Proceed with Prolia today.  Continue calcium and vitamin D supplementation.  Return to clinic in 6 months for repeat laboratory work, further evaluation, and continuation of treatment. 3.  Tremors/Parkinson's: Stable. Treatment per  neurology.   I spent a total of 20 minutes reviewing chart data, face-to-face evaluation with the patient, counseling and coordination of care as detailed above.   Patient expressed understanding and was in agreement with this plan. She also understands that She can call clinic at any time with any questions, concerns, or complaints.   Breast cancer [facility])   Staging form: Breast, AJCC 7th Edition     Clinical stage from 09/22/2015: Stage IIA (T1c, N1, M0) - Signed by Lloyd Huger, MD on 09/22/2015   Lloyd Huger, MD   01/29/2020 6:22 AM

## 2020-01-27 ENCOUNTER — Other Ambulatory Visit: Payer: Self-pay

## 2020-01-27 ENCOUNTER — Encounter: Payer: Self-pay | Admitting: Oncology

## 2020-01-27 ENCOUNTER — Inpatient Hospital Stay: Payer: Medicare Other

## 2020-01-27 ENCOUNTER — Inpatient Hospital Stay (HOSPITAL_BASED_OUTPATIENT_CLINIC_OR_DEPARTMENT_OTHER): Payer: Medicare Other | Admitting: Oncology

## 2020-01-27 ENCOUNTER — Inpatient Hospital Stay: Payer: Medicare Other | Attending: Oncology

## 2020-01-27 VITALS — BP 138/82 | HR 62 | Temp 99.2°F | Resp 18 | Wt 187.0 lb

## 2020-01-27 DIAGNOSIS — Z170 Estrogen receptor positive status [ER+]: Secondary | ICD-10-CM | POA: Insufficient documentation

## 2020-01-27 DIAGNOSIS — M818 Other osteoporosis without current pathological fracture: Secondary | ICD-10-CM

## 2020-01-27 DIAGNOSIS — Z823 Family history of stroke: Secondary | ICD-10-CM | POA: Insufficient documentation

## 2020-01-27 DIAGNOSIS — Z7982 Long term (current) use of aspirin: Secondary | ICD-10-CM | POA: Diagnosis not present

## 2020-01-27 DIAGNOSIS — M8589 Other specified disorders of bone density and structure, multiple sites: Secondary | ICD-10-CM

## 2020-01-27 DIAGNOSIS — Z853 Personal history of malignant neoplasm of breast: Secondary | ICD-10-CM | POA: Diagnosis not present

## 2020-01-27 DIAGNOSIS — Z833 Family history of diabetes mellitus: Secondary | ICD-10-CM | POA: Insufficient documentation

## 2020-01-27 DIAGNOSIS — Z9013 Acquired absence of bilateral breasts and nipples: Secondary | ICD-10-CM | POA: Diagnosis not present

## 2020-01-27 DIAGNOSIS — Z90710 Acquired absence of both cervix and uterus: Secondary | ICD-10-CM | POA: Insufficient documentation

## 2020-01-27 DIAGNOSIS — Z79811 Long term (current) use of aromatase inhibitors: Secondary | ICD-10-CM | POA: Insufficient documentation

## 2020-01-27 DIAGNOSIS — C50211 Malignant neoplasm of upper-inner quadrant of right female breast: Secondary | ICD-10-CM | POA: Diagnosis not present

## 2020-01-27 DIAGNOSIS — M810 Age-related osteoporosis without current pathological fracture: Secondary | ICD-10-CM | POA: Diagnosis not present

## 2020-01-27 DIAGNOSIS — Z79899 Other long term (current) drug therapy: Secondary | ICD-10-CM | POA: Diagnosis not present

## 2020-01-27 DIAGNOSIS — G20 Parkinson's disease: Secondary | ICD-10-CM | POA: Insufficient documentation

## 2020-01-27 DIAGNOSIS — I10 Essential (primary) hypertension: Secondary | ICD-10-CM | POA: Insufficient documentation

## 2020-01-27 DIAGNOSIS — Z791 Long term (current) use of non-steroidal anti-inflammatories (NSAID): Secondary | ICD-10-CM | POA: Diagnosis not present

## 2020-01-27 LAB — BASIC METABOLIC PANEL
Anion gap: 7 (ref 5–15)
BUN: 26 mg/dL — ABNORMAL HIGH (ref 8–23)
CO2: 27 mmol/L (ref 22–32)
Calcium: 9.2 mg/dL (ref 8.9–10.3)
Chloride: 101 mmol/L (ref 98–111)
Creatinine, Ser: 1.02 mg/dL — ABNORMAL HIGH (ref 0.44–1.00)
GFR calc Af Amer: 59 mL/min — ABNORMAL LOW (ref >60)
GFR calc non Af Amer: 51 mL/min — ABNORMAL LOW (ref >60)
Glucose, Bld: 82 mg/dL (ref 70–99)
Potassium: 4.9 mmol/L (ref 3.5–5.1)
Sodium: 135 mmol/L (ref 135–145)

## 2020-01-27 MED ORDER — DENOSUMAB 60 MG/ML ~~LOC~~ SOSY
60.0000 mg | PREFILLED_SYRINGE | Freq: Once | SUBCUTANEOUS | Status: AC
  Administered 2020-01-27: 12:00:00 60 mg via SUBCUTANEOUS
  Filled 2020-01-27: qty 1

## 2020-01-27 NOTE — Progress Notes (Signed)
Pt in for follow up, denies any concerns today. 

## 2020-01-28 DIAGNOSIS — Z23 Encounter for immunization: Secondary | ICD-10-CM | POA: Diagnosis not present

## 2020-01-29 ENCOUNTER — Ambulatory Visit: Payer: Medicare Other | Admitting: Pulmonary Disease

## 2020-02-02 DIAGNOSIS — N390 Urinary tract infection, site not specified: Secondary | ICD-10-CM | POA: Diagnosis not present

## 2020-02-02 DIAGNOSIS — R399 Unspecified symptoms and signs involving the genitourinary system: Secondary | ICD-10-CM | POA: Diagnosis not present

## 2020-02-10 NOTE — Progress Notes (Signed)
Cancelled by patient

## 2020-02-11 DIAGNOSIS — R413 Other amnesia: Secondary | ICD-10-CM | POA: Diagnosis not present

## 2020-02-11 DIAGNOSIS — F329 Major depressive disorder, single episode, unspecified: Secondary | ICD-10-CM | POA: Diagnosis not present

## 2020-02-11 DIAGNOSIS — R251 Tremor, unspecified: Secondary | ICD-10-CM | POA: Diagnosis not present

## 2020-02-11 DIAGNOSIS — I10 Essential (primary) hypertension: Secondary | ICD-10-CM | POA: Diagnosis not present

## 2020-02-11 DIAGNOSIS — R519 Headache, unspecified: Secondary | ICD-10-CM | POA: Diagnosis not present

## 2020-02-25 ENCOUNTER — Telehealth: Payer: Medicare Other | Admitting: Pain Medicine

## 2020-02-26 ENCOUNTER — Ambulatory Visit: Payer: Medicare Other | Admitting: Pulmonary Disease

## 2020-02-26 ENCOUNTER — Other Ambulatory Visit: Payer: Self-pay

## 2020-02-26 ENCOUNTER — Encounter: Payer: Self-pay | Admitting: Pulmonary Disease

## 2020-02-26 VITALS — BP 122/70 | HR 73 | Temp 97.8°F | Ht 64.0 in | Wt 188.0 lb

## 2020-02-26 DIAGNOSIS — R0602 Shortness of breath: Secondary | ICD-10-CM

## 2020-02-26 NOTE — Patient Instructions (Signed)
We will see you as needed.  To give [place] a call back should you develop any further breathing issues.   Continue follow-ups with Dr. Nicki Reaper.

## 2020-03-03 DIAGNOSIS — N390 Urinary tract infection, site not specified: Secondary | ICD-10-CM | POA: Diagnosis not present

## 2020-03-03 DIAGNOSIS — R5383 Other fatigue: Secondary | ICD-10-CM | POA: Diagnosis not present

## 2020-03-03 DIAGNOSIS — D649 Anemia, unspecified: Secondary | ICD-10-CM | POA: Diagnosis not present

## 2020-03-03 DIAGNOSIS — R531 Weakness: Secondary | ICD-10-CM | POA: Diagnosis not present

## 2020-03-03 DIAGNOSIS — K219 Gastro-esophageal reflux disease without esophagitis: Secondary | ICD-10-CM | POA: Diagnosis not present

## 2020-03-03 DIAGNOSIS — N3000 Acute cystitis without hematuria: Secondary | ICD-10-CM | POA: Diagnosis not present

## 2020-03-03 DIAGNOSIS — B9620 Unspecified Escherichia coli [E. coli] as the cause of diseases classified elsewhere: Secondary | ICD-10-CM | POA: Diagnosis not present

## 2020-03-03 DIAGNOSIS — Z1624 Resistance to multiple antibiotics: Secondary | ICD-10-CM | POA: Diagnosis not present

## 2020-03-03 DIAGNOSIS — N12 Tubulo-interstitial nephritis, not specified as acute or chronic: Secondary | ICD-10-CM | POA: Diagnosis not present

## 2020-03-03 DIAGNOSIS — N182 Chronic kidney disease, stage 2 (mild): Secondary | ICD-10-CM | POA: Diagnosis not present

## 2020-03-03 DIAGNOSIS — Z20822 Contact with and (suspected) exposure to covid-19: Secondary | ICD-10-CM | POA: Diagnosis not present

## 2020-03-03 DIAGNOSIS — R509 Fever, unspecified: Secondary | ICD-10-CM | POA: Diagnosis not present

## 2020-03-05 DIAGNOSIS — N3000 Acute cystitis without hematuria: Secondary | ICD-10-CM | POA: Diagnosis not present

## 2020-03-05 DIAGNOSIS — N12 Tubulo-interstitial nephritis, not specified as acute or chronic: Secondary | ICD-10-CM | POA: Diagnosis not present

## 2020-03-05 DIAGNOSIS — R5383 Other fatigue: Secondary | ICD-10-CM | POA: Diagnosis not present

## 2020-03-05 DIAGNOSIS — R509 Fever, unspecified: Secondary | ICD-10-CM | POA: Diagnosis not present

## 2020-03-06 DIAGNOSIS — Z9221 Personal history of antineoplastic chemotherapy: Secondary | ICD-10-CM | POA: Diagnosis not present

## 2020-03-06 DIAGNOSIS — B9620 Unspecified Escherichia coli [E. coli] as the cause of diseases classified elsewhere: Secondary | ICD-10-CM | POA: Diagnosis present

## 2020-03-06 DIAGNOSIS — Z1624 Resistance to multiple antibiotics: Secondary | ICD-10-CM | POA: Diagnosis present

## 2020-03-06 DIAGNOSIS — B9629 Other Escherichia coli [E. coli] as the cause of diseases classified elsewhere: Secondary | ICD-10-CM | POA: Diagnosis not present

## 2020-03-06 DIAGNOSIS — I129 Hypertensive chronic kidney disease with stage 1 through stage 4 chronic kidney disease, or unspecified chronic kidney disease: Secondary | ICD-10-CM | POA: Diagnosis present

## 2020-03-06 DIAGNOSIS — N182 Chronic kidney disease, stage 2 (mild): Secondary | ICD-10-CM | POA: Diagnosis present

## 2020-03-06 DIAGNOSIS — D649 Anemia, unspecified: Secondary | ICD-10-CM | POA: Diagnosis present

## 2020-03-06 DIAGNOSIS — E785 Hyperlipidemia, unspecified: Secondary | ICD-10-CM | POA: Diagnosis present

## 2020-03-06 DIAGNOSIS — G629 Polyneuropathy, unspecified: Secondary | ICD-10-CM | POA: Diagnosis present

## 2020-03-06 DIAGNOSIS — Z9012 Acquired absence of left breast and nipple: Secondary | ICD-10-CM | POA: Diagnosis not present

## 2020-03-06 DIAGNOSIS — K219 Gastro-esophageal reflux disease without esophagitis: Secondary | ICD-10-CM | POA: Diagnosis present

## 2020-03-06 DIAGNOSIS — Z20822 Contact with and (suspected) exposure to covid-19: Secondary | ICD-10-CM | POA: Diagnosis present

## 2020-03-06 DIAGNOSIS — N12 Tubulo-interstitial nephritis, not specified as acute or chronic: Secondary | ICD-10-CM | POA: Diagnosis not present

## 2020-03-06 DIAGNOSIS — Z853 Personal history of malignant neoplasm of breast: Secondary | ICD-10-CM | POA: Diagnosis not present

## 2020-03-06 DIAGNOSIS — I10 Essential (primary) hypertension: Secondary | ICD-10-CM | POA: Diagnosis not present

## 2020-03-06 DIAGNOSIS — N3946 Mixed incontinence: Secondary | ICD-10-CM | POA: Diagnosis present

## 2020-03-06 DIAGNOSIS — N3000 Acute cystitis without hematuria: Secondary | ICD-10-CM | POA: Diagnosis present

## 2020-03-06 DIAGNOSIS — Z882 Allergy status to sulfonamides status: Secondary | ICD-10-CM | POA: Diagnosis not present

## 2020-03-06 DIAGNOSIS — Z79899 Other long term (current) drug therapy: Secondary | ICD-10-CM | POA: Diagnosis not present

## 2020-03-07 MED ORDER — METOPROLOL SUCCINATE ER 25 MG PO TB24
25.00 | ORAL_TABLET | ORAL | Status: DC
Start: 2020-03-08 — End: 2020-03-07

## 2020-03-07 MED ORDER — POLYETHYLENE GLYCOL 3350 17 GM/SCOOP PO POWD
17.00 | ORAL | Status: DC
Start: ? — End: 2020-03-07

## 2020-03-07 MED ORDER — MELATONIN 3 MG PO TABS
3.00 | ORAL_TABLET | ORAL | Status: DC
Start: ? — End: 2020-03-07

## 2020-03-07 MED ORDER — PANTOPRAZOLE SODIUM 40 MG PO TBEC
40.00 | DELAYED_RELEASE_TABLET | ORAL | Status: DC
Start: 2020-03-08 — End: 2020-03-07

## 2020-03-07 MED ORDER — AMLODIPINE BESYLATE 5 MG PO TABS
2.50 | ORAL_TABLET | ORAL | Status: DC
Start: 2020-03-08 — End: 2020-03-07

## 2020-03-07 MED ORDER — ONDANSETRON 4 MG PO TBDP
4.00 | ORAL_TABLET | ORAL | Status: DC
Start: ? — End: 2020-03-07

## 2020-03-07 MED ORDER — ENOXAPARIN SODIUM 40 MG/0.4ML ~~LOC~~ SOLN
40.00 | SUBCUTANEOUS | Status: DC
Start: 2020-03-08 — End: 2020-03-07

## 2020-03-07 MED ORDER — SENNOSIDES 8.6 MG PO TABS
2.00 | ORAL_TABLET | ORAL | Status: DC
Start: ? — End: 2020-03-07

## 2020-03-07 MED ORDER — LOSARTAN POTASSIUM 100 MG PO TABS
100.00 | ORAL_TABLET | ORAL | Status: DC
Start: 2020-03-08 — End: 2020-03-07

## 2020-03-07 MED ORDER — PRAVASTATIN SODIUM 20 MG PO TABS
20.00 | ORAL_TABLET | ORAL | Status: DC
Start: 2020-03-07 — End: 2020-03-07

## 2020-03-07 MED ORDER — ACETAMINOPHEN 325 MG PO TABS
650.00 | ORAL_TABLET | ORAL | Status: DC
Start: ? — End: 2020-03-07

## 2020-03-07 MED ORDER — GABAPENTIN 100 MG PO CAPS
200.00 | ORAL_CAPSULE | ORAL | Status: DC
Start: 2020-03-07 — End: 2020-03-07

## 2020-03-08 ENCOUNTER — Telehealth: Payer: Self-pay

## 2020-03-08 NOTE — Telephone Encounter (Signed)
Transition Care Management Follow-up Telephone Call  Date of discharge and from where: 03/07/20 from [facility]  How have you been since you were released from the hospital? "Better than I was and if I stay on my feet very much my back starts to hurt in a certain spot so I am trying not to move too much. Drinking lots of water, I feel thirsty for some reason. I have an appetite and managing better."  Denies nausea, vomiting, diarrhea, pain when urinating and all other symptoms.   Any questions or concerns?   Items Reviewed:  Did the pt receive and understand the discharge instructions provided? Yes, increase activity as tolerated.   Medications obtained and verified? Yes, added Macrobid. Taking all other scheduled medications as directed.  Any new allergies since your discharge? No  Dietary orders reviewed? Regular  Do you have support at home? Yes, husband.  Functional Questionnaire: (I = Independent and D = Dependent) ADLs: I  Bathing/Dressing- I  Meal Prep- I  Eating- I  Maintaining continence- Bladder incontinence increased since discharge.  Transferring/Ambulation- I  Managing Meds- I  Follow up appointments reviewed:   PCP Hospital f/u appt confirmed?  Scheduled to see Dr. Nicki Reaper on 03/09/20 @ 11:30.  Are transportation arrangements needed? No  If their condition worsens, is the pt aware to call PCP or go to the Emergency Dept.? Yes  Was the patient provided with contact information for the PCP's office or ED? Yes  Was to pt encouraged to call back with questions or concerns? Yes

## 2020-03-09 ENCOUNTER — Other Ambulatory Visit: Payer: Self-pay

## 2020-03-09 ENCOUNTER — Ambulatory Visit (INDEPENDENT_AMBULATORY_CARE_PROVIDER_SITE_OTHER): Payer: Medicare Other | Admitting: Internal Medicine

## 2020-03-09 VITALS — BP 128/78 | HR 74 | Temp 96.9°F | Resp 16 | Ht 64.0 in | Wt 186.0 lb

## 2020-03-09 DIAGNOSIS — R7989 Other specified abnormal findings of blood chemistry: Secondary | ICD-10-CM

## 2020-03-09 DIAGNOSIS — I10 Essential (primary) hypertension: Secondary | ICD-10-CM

## 2020-03-09 DIAGNOSIS — K219 Gastro-esophageal reflux disease without esophagitis: Secondary | ICD-10-CM | POA: Diagnosis not present

## 2020-03-09 DIAGNOSIS — N390 Urinary tract infection, site not specified: Secondary | ICD-10-CM

## 2020-03-09 DIAGNOSIS — R531 Weakness: Secondary | ICD-10-CM | POA: Diagnosis not present

## 2020-03-09 DIAGNOSIS — D649 Anemia, unspecified: Secondary | ICD-10-CM

## 2020-03-09 DIAGNOSIS — N1831 Chronic kidney disease, stage 3a: Secondary | ICD-10-CM

## 2020-03-09 NOTE — Progress Notes (Signed)
Patient ID: Wanda Martinez, female   DOB: Apr 27, 1935, 85 y.o.   MRN: [id number]   Subjective:    Patient ID: Wanda Martinez, female    DOB: 06-20-1935, 85 y.o.   MRN: [id number]  HPI This visit occurred during the SARS-CoV-2 public health emergency.  Safety protocols were in place, including screening questions prior to the visit, additional usage of staff PPE, and extensive cleaning of exam room while observing appropriate contact time as indicated for disinfecting solutions.  Patient here for hospital follow up.  Recently admitted 03/05/20 - 03/07/20 with acute cystitis. Initially presented to the hospital on 03/03/20 and was diagnosed with UTI.  Was called back to the hospital after her urine culture grew MDR E.coli resistant to cefdinir she had been prescribed.  She was admitted and started on IV ertapenem.  Symptoms (dysuria and suprapubic tenderness) - improved.  F/u urine  Sterile and IV abx stopped and she was placed on macrobid to complete course.   Was found to be slightly anemic during admission.  Since discharge, she is feeling better.  Drinking.  Appetite improved.  No nausea or vomiting now.  No abdominal pain now.  Eating.  Discussed using a cane for assistance at home.  She had seen cardiology previously.  She request to reschedule f/u - to f/u from previous tests, etc.    Past Medical History:  Diagnosis Date  . Acute postoperative pain 10/25/2017  . Anemia   . Arm pain 07/26/2015  . Arthritis   . Arthritis, degenerative 03/26/2014  . Back pain 11/01/2013  . Bladder infection 06/2018  . Breast cancer ([place])    Masectomy - left - 1986   . Breast cancer  [facility])    Mastectomy-right -2014  . CHEST PAIN 04/29/2010   Qualifier: Diagnosis of  By: Wynetta Emery RN, Doroteo Bradford    . Chronic cystitis   . Cystocele 02/22/2013  . Cystocele, midline 08/19/2013  . Degeneration of intervertebral disc of lumbosacral region 03/26/2014  . DYSPNEA 04/29/2010   Qualifier: Diagnosis of  By: Wynetta Emery RN, Doroteo Bradford      . Enthesopathy of hip 03/26/2014  . GERD (gastroesophageal reflux disease)   . Hiatal hernia   . HTN (hypertension)   . Hypokalemia 06/2018  . Hyponatremia 06/2018  . LBP (low back pain) 03/26/2014  . Neck pain 11/01/2013  . Parkinson disease ([place])   . Pneumonia 06/2018  . Sinusitis 02/07/2015  . Skin lesions 07/12/2014  . Urinary incontinence    mixed    Past Surgical History:  Procedure Laterality Date  . ABDOMINAL HYSTERECTOMY    . BREAST BIOPSY  2013  . BREAST ENHANCEMENT SURGERY  1987  . BREAST IMPLANT REMOVAL    . BREAST IMPLANT REMOVAL Right 08/29/2012  . BREAST SURGERY  1986   s/p left mastectomy  . COLONOSCOPY WITH PROPOFOL N/A 09/13/2016   Procedure: COLONOSCOPY WITH PROPOFOL;  Surgeon: Manya Silvas, MD;  Location[facility]COPY;  Service: Endoscopy;  Laterality: N/A;  . MASTECTOMY  08/2012   right  . TONSILLECTOMY AND ADENOIDECTOMY  79  . VESICOVAGINAL FISTULA CLOSURE W/ TAH  1983   Family History  Problem Relation Age of Onset  . Diabetes Father   . Stroke Father   . Colon polyps Father   . Stroke Mother   . Parkinson's disease Mother    Social History   Socioeconomic History  . Marital status: Married    Spouse name: Not on file  . Number of children: 3  .  Years of education: Not on file  . Highest education level: Not on file  Occupational History  . Not on file  Tobacco Use  . Smoking status: Never Smoker  . Smokeless tobacco: Never Used  . Tobacco comment: tobacco use - no  Substance and Sexual Activity  . Alcohol use: No    Alcohol/week: 0.0 standard drinks  . Drug use: No  . Sexual activity: Never  Other Topics Concern  . Not on file  Social History Narrative  . Not on file   Social Determinants of Health   Financial Resource Strain: Low Risk   . Difficulty of Paying Living Expenses: Not hard at all  Food Insecurity: No Food Insecurity  . Worried About Charity fundraiser in the Last Year: Never true  . Ran Out of Food in the Last  Year: Never true  Transportation Needs: No Transportation Needs  . Lack of Transportation (Medical): No  . Lack of Transportation (Non-Medical): No  Physical Activity:   . Days of Exercise per Week:   . Minutes of Exercise per Session:   Stress: No Stress Concern Present  . Feeling of Stress : Not at all  Social Connections:   . Frequency of Communication with Friends and Family:   . Frequency of Social Gatherings with Friends and Family:   . Attends Religious Services:   . Active Member of Clubs or Organizations:   . Attends Archivist Meetings:   Marland Kitchen Marital Status:     Outpatient Encounter Medications as of 03/09/2020  Medication Sig  . amLODipine (NORVASC) 2.5 MG tablet TAKE 1 TABLET BY MOUTH ONCE DAILY.  Marland Kitchen aspirin 81 MG EC tablet Take 81 mg by mouth daily as needed.    . Calcium Carbonate (CALCIUM 600) 1500 MG TABS Take 1 tablet by mouth daily.    . citalopram (CELEXA) 20 MG tablet TAKE 1 & 1/2 TABLETS BY MOUTH ONCE DAILY  . cyclobenzaprine (FLEXERIL) 10 MG tablet TAKE ONE TABLET BY MOUTH AT BEDTIME.  Marland Kitchen gabapentin (NEURONTIN) 100 MG capsule Take 200 mg by mouth 3 (three) times daily.   Marland Kitchen GLUCOSAMINE SULFATE PO Take by mouth daily.   . hydrochlorothiazide (HYDRODIURIL) 25 MG tablet TAKE 1 TABLET BY MOUTH ONCE DAILY. (PATIENT TAKES 1/2 ONCE DAILY AND EXTRA 1/2 IF NEEDED)  . HYDROcodone-acetaminophen (NORCO/VICODIN) 5-325 MG tablet Take 1 tablet by mouth every 8 (eight) hours as needed for severe pain. Must last 30 days  . HYDROcodone-acetaminophen (NORCO/VICODIN) 5-325 MG tablet Take 1 tablet by mouth every 8 (eight) hours as needed for severe pain. Must last 30 days  . HYDROcodone-acetaminophen (NORCO/VICODIN) 5-325 MG tablet Take 1 tablet by mouth every 8 (eight) hours as needed for severe pain. Must last 30 days  . letrozole (FEMARA) 2.5 MG tablet Take 2.5 mg by mouth daily.  Marland Kitchen losartan (COZAAR) 50 MG tablet TAKE (2) TABLETS BY MOUTH ONCE DAILY.  Marland Kitchen lubiprostone (AMITIZA)  24 MCG capsule TAKE 1 CAPSULE BY MOUTH ONCE DAILY WITH BREAKFAST.  . magnesium oxide (MAG-OX) 400 (241.3 Mg) MG tablet TAKE 1 TABLET BY MOUTH ONCE DAILY.  . meloxicam (MOBIC) 7.5 MG tablet TAKE 1 TABLET BY MOUTH ONCE DAILY.  . metoprolol succinate (TOPROL-XL) 25 MG 24 hr tablet TAKE 1 TABLET BY MOUTH TWICE DAILY  . Multiple Vitamin (MULTIVITAMIN) tablet Take 1 tablet by mouth daily.  Marland Kitchen nystatin (MYCOSTATIN) 100000 UNIT/ML suspension Swish and spit tid  . Omega-3 1000 MG CAPS Take by mouth daily.   Marland Kitchen  omeprazole (PRILOSEC) 20 MG capsule TAKE 1 CAPSULE BY MOUTH TWICE DAILY FOR REFLUX.  . pantoprazole (PROTONIX) 40 MG tablet TAKE 1 TABLET BY MOUTH ONCE DAILY.  . pravastatin (PRAVACHOL) 20 MG tablet TAKE 1 TABLET BY MOUTH ONCE DAILY.   No facility-administered encounter medications on file as of 03/09/2020.    Review of Systems  Constitutional: Negative for fever and unexpected weight change.       Appetite better.    HENT: Negative for congestion and sinus pressure.   Respiratory: Negative for cough, chest tightness and shortness of breath.   Cardiovascular: Negative for chest pain, palpitations and leg swelling.  Gastrointestinal: Negative for abdominal pain, diarrhea, nausea and vomiting.  Genitourinary: Negative for difficulty urinating, dysuria and vaginal discharge.       No urinary symptoms now.   Musculoskeletal: Negative for joint swelling and myalgias.  Skin: Negative for color change and rash.  Neurological: Negative for dizziness, light-headedness and headaches.  Psychiatric/Behavioral: Negative for agitation and dysphoric mood.       Objective:    Physical Exam Constitutional:      General: She is not in acute distress.    Appearance: Normal appearance.  HENT:     Head: Normocephalic and atraumatic.     Right Ear: External ear normal.     Left Ear: External ear normal.  Neck:     Thyroid: No thyromegaly.  Cardiovascular:     Rate and Rhythm: Normal rate and regular  rhythm.  Pulmonary:     Effort: No respiratory distress.     Breath sounds: Normal breath sounds. No wheezing.  Abdominal:     General: Bowel sounds are normal.     Palpations: Abdomen is soft.     Tenderness: There is no abdominal tenderness.  Musculoskeletal:        General: No swelling or tenderness.     Cervical back: Neck supple. No tenderness.  Lymphadenopathy:     Cervical: No cervical adenopathy.  Skin:    Findings: No erythema or rash.  Neurological:     Mental Status: She is alert.  Psychiatric:        Mood and Affect: Mood normal.        Behavior: Behavior normal.     BP 128/78   Pulse 74   Temp (!) 96.9 F (36.1 C)   Resp 16   Ht '5\' 4"'$  (1.626 m)   Wt 186 lb (84.4 kg)   LMP 12/18/1981   SpO2 98%   BMI 31.93 kg/m  Wt Readings from Last 3 Encounters:  03/09/20 186 lb (84.4 kg)  02/26/20 188 lb (85.3 kg)  01/27/20 187 lb (84.8 kg)     Lab Results  Component Value Date   WBC 5.3 01/05/2020   HGB 11.7 (L) 01/05/2020   HCT 35.1 (L) 01/05/2020   PLT 270.0 01/05/2020   GLUCOSE 82 01/27/2020   CHOL 143 07/17/2019   TRIG 170.0 (H) 07/17/2019   HDL 38.60 (L) 07/17/2019   LDLDIRECT 73.0 07/09/2018   LDLCALC 71 07/17/2019   ALT 20 10/17/2019   AST 28 10/17/2019   NA 135 01/27/2020   K 4.9 01/27/2020   CL 101 01/27/2020   CREATININE 1.02 (H) 01/27/2020   BUN 26 (H) 01/27/2020   CO2 27 01/27/2020   TSH 4.85 (H) 01/05/2020   HGBA1C 6.1 01/05/2020    MR Lumbar Spine w/o contrast  Result Date: 12/30/2019 CLINICAL DATA:  Low back pain extending to the right buttock and lower  extremity. EXAM: MRI LUMBAR SPINE WITHOUT CONTRAST TECHNIQUE: Multiplanar, multisequence MR imaging of the lumbar spine was performed. No intravenous contrast was administered. COMPARISON:  05/17/2017 FINDINGS: Segmentation:  5 lumbar type vertebral bodies. Alignment:  Curvature convex to the right with the apex at L2. Vertebrae: No fracture or primary bone lesion. Inferior endplate  Schmorl's node on the left at L2 with some edema. Conus medullaris and cauda equina: Conus extends to the L1 level. Conus and cauda equina appear normal. Paraspinal and other soft tissues: Negative Disc levels: T11-12, T12-L1 and L1-2: Mild noncompressive disc bulges. L2-3: Disc degeneration more pronounced on the left. No canal stenosis. Foraminal narrowing on the left that would have some potential to affect the exiting L2 nerve. This has worsened slightly since 2018. L3-4: Endplate osteophytes and bulging of the disc. Mild facet hypertrophy. Mild stenosis of the right lateral recess but without gross neural compression. L4-5: Endplate osteophytes and bulging of the disc. Facet degeneration and hypertrophy on the right. Mild narrowing of the intervertebral foramen on the right, not grossly compressive. L5-S1: Bulging of the disc more prominent towards the right. Facet degeneration and hypertrophy more pronounced on the right. Foraminal narrowing on the right that could possibly affect the exiting L5 nerve. This has worsened slightly since 2018. IMPRESSION: Curvature convex to the right with the apex at L2 as seen previously. Slight worsening of disc degeneration on the left side at L2-3. L2 Schmorl's node with edema that could contribute to back pain. Left foraminal encroachment secondary to osteophytes and bulging disc material would have some potential to affect the exiting L2 nerve. Slight worsening of degenerative changes on the right at L5-S1. Bulging of the disc. Facet degeneration and hypertrophy. Right foraminal narrowing that could possibly affect the right L5 nerve. The facet arthropathy could also be a cause of back pain or referred facet syndrome pain. Lesser facet osteoarthritis on the left. Electronically Signed   By: Nelson Chimes M.D.   On: 12/30/2019 15:27   DG HIP UNILAT W OR W/O PELVIS 2-3 VIEWS RIGHT  Result Date: 12/30/2019 CLINICAL DATA:  Right hip pain, chronic, worsening EXAM: DG HIP  (WITH OR WITHOUT PELVIS) 2-3V RIGHT COMPARISON:  02/23/2012 CT abdomen/pelvis FINDINGS: No pelvic fracture or diastasis. No right hip fracture or dislocation. No suspicious focal osseous lesions. No significant right hip arthropathy. Degenerative changes in the visualized lower lumbar spine. IMPRESSION: No acute osseous abnormality. No right hip malalignment. No significant right hip arthropathy. Degenerative changes in the visualized lower lumbar spine. Electronically Signed   By: Ilona Sorrel M.D.   On: 12/30/2019 17:23       Assessment & Plan:   Problem List Items Addressed This Visit    Anemia    Noticed to be decreased during hospitalization.  Recheck cbc.  Last colonoscopy 2017 - diverticulosis and internal hemorrhoids.        Relevant Orders   CBC with Differential/Platelet   IBC + Ferritin   CKD (chronic kidney disease) stage 3, GFR 30-59 ml/min (HCC)    Follow met b.  Stay hydrated.        Essential hypertension    Blood pressure under good control.  Continue same medication regimen - hctz, metoprolol and amlodipine.   Follow pressures.  Follow metabolic panel.        Relevant Orders   TSH   Basic metabolic panel   Frequent UTI    Has a previous history of frequent uti.  Recently admitted with culture revealing  MDR E. Coli.  Treated with IV abx and converted over to macrobid.  Feeling better.  Appetite better.  Request referral to urology.        GERD (gastroesophageal reflux disease)    Upper symptoms controlled on omeprazole.        Other Visit Diagnoses    Weakness    -  Primary   Relevant Orders   For home use only DME Cane   Elevated TSH           Einar Pheasant, MD

## 2020-03-14 ENCOUNTER — Encounter: Payer: Self-pay | Admitting: Internal Medicine

## 2020-03-14 NOTE — Assessment & Plan Note (Signed)
Blood pressure under good control.  Continue same medication regimen - hctz, metoprolol and amlodipine.   Follow pressures.  Follow metabolic panel.

## 2020-03-14 NOTE — Assessment & Plan Note (Signed)
Follow met b.  Stay hydrated.

## 2020-03-14 NOTE — Assessment & Plan Note (Signed)
Has a previous history of frequent uti.  Recently admitted with culture revealing MDR E. Coli.  Treated with IV abx and converted over to macrobid.  Feeling better.  Appetite better.  Request referral to urology.

## 2020-03-14 NOTE — Assessment & Plan Note (Signed)
Upper symptoms controlled on omeprazole.  

## 2020-03-14 NOTE — Assessment & Plan Note (Signed)
Noticed to be decreased during hospitalization.  Recheck cbc.  Last colonoscopy 2017 - diverticulosis and internal hemorrhoids.

## 2020-03-18 ENCOUNTER — Ambulatory Visit
Admission: RE | Admit: 2020-03-18 | Discharge: 2020-03-18 | Disposition: A | Discharge status: Home / Self Care | Payer: Medicare Other | Source: Ambulatory Visit | Attending: Pain Medicine | Admitting: Pain Medicine

## 2020-03-18 ENCOUNTER — Other Ambulatory Visit: Payer: Self-pay

## 2020-03-18 ENCOUNTER — Ambulatory Visit (HOSPITAL_BASED_OUTPATIENT_CLINIC_OR_DEPARTMENT_OTHER): Payer: Medicare Other | Admitting: Pain Medicine

## 2020-03-18 ENCOUNTER — Encounter: Payer: Self-pay | Admitting: Pain Medicine

## 2020-03-18 VITALS — BP 159/90 | HR 70 | Temp 97.6°F | Resp 17 | Ht 64.0 in | Wt 185.0 lb

## 2020-03-18 DIAGNOSIS — M47898 Other spondylosis, sacral and sacrococcygeal region: Secondary | ICD-10-CM | POA: Insufficient documentation

## 2020-03-18 DIAGNOSIS — M150 Primary generalized (osteo)arthritis: Secondary | ICD-10-CM | POA: Insufficient documentation

## 2020-03-18 DIAGNOSIS — M545 Low back pain: Secondary | ICD-10-CM | POA: Diagnosis not present

## 2020-03-18 DIAGNOSIS — M159 Polyosteoarthritis, unspecified: Secondary | ICD-10-CM | POA: Insufficient documentation

## 2020-03-18 DIAGNOSIS — M533 Sacrococcygeal disorders, not elsewhere classified: Secondary | ICD-10-CM | POA: Insufficient documentation

## 2020-03-18 DIAGNOSIS — G8929 Other chronic pain: Secondary | ICD-10-CM | POA: Diagnosis not present

## 2020-03-18 DIAGNOSIS — M9904 Segmental and somatic dysfunction of sacral region: Secondary | ICD-10-CM | POA: Diagnosis not present

## 2020-03-18 MED ORDER — LIDOCAINE HCL 2 % IJ SOLN
INTRAMUSCULAR | Status: AC
  Filled 2020-03-18: qty 20

## 2020-03-18 MED ORDER — ROPIVACAINE HCL 2 MG/ML IJ SOLN
INTRAMUSCULAR | Status: AC
  Filled 2020-03-18: qty 10

## 2020-03-18 MED ORDER — MIDAZOLAM HCL 5 MG/5ML IJ SOLN
1.0000 mg | INTRAMUSCULAR | Status: DC | PRN
  Administered 2020-03-18: 1 mg via INTRAVENOUS

## 2020-03-18 MED ORDER — FENTANYL CITRATE (PF) 100 MCG/2ML IJ SOLN
25.0000 ug | INTRAMUSCULAR | Status: DC | PRN
  Administered 2020-03-18: 10:00:00 50 ug via INTRAVENOUS

## 2020-03-18 MED ORDER — LIDOCAINE HCL 2 % IJ SOLN
20.0000 mL | Freq: Once | INTRAMUSCULAR | Status: AC
  Administered 2020-03-18: 400 mg

## 2020-03-18 MED ORDER — METHYLPREDNISOLONE ACETATE 80 MG/ML IJ SUSP
INTRAMUSCULAR | Status: AC
  Filled 2020-03-18: qty 1

## 2020-03-18 MED ORDER — ROPIVACAINE HCL 2 MG/ML IJ SOLN
4.0000 mL | Freq: Once | INTRAMUSCULAR | Status: AC
  Administered 2020-03-18: 4 mL via INTRA_ARTICULAR

## 2020-03-18 MED ORDER — METHYLPREDNISOLONE ACETATE 80 MG/ML IJ SUSP
80.0000 mg | Freq: Once | INTRAMUSCULAR | Status: AC
  Administered 2020-03-18: 10:00:00 80 mg via INTRA_ARTICULAR

## 2020-03-18 MED ORDER — LACTATED RINGERS IV SOLN
1000.0000 mL | Freq: Once | INTRAVENOUS | Status: AC
  Administered 2020-03-18: 10:00:00 1000 mL via INTRAVENOUS

## 2020-03-18 MED ORDER — FENTANYL CITRATE (PF) 100 MCG/2ML IJ SOLN
INTRAMUSCULAR | Status: AC
  Filled 2020-03-18: qty 2

## 2020-03-18 MED ORDER — MIDAZOLAM HCL 5 MG/5ML IJ SOLN
INTRAMUSCULAR | Status: AC
  Filled 2020-03-18: qty 5

## 2020-03-18 NOTE — Progress Notes (Addendum)
PROVIDER NOTE: Information contained herein reflects review and annotations entered in association with encounter. Interpretation of such information and data should be left to medically-trained personnel. Information provided to patient can be located elsewhere in the medical record under "Patient Instructions". Document created using STT-dictation technology, any transcriptional errors that may result from process are unintentional.    Patient: Wanda Martinez  Service Category: Procedure  Provider: Gaspar Cola, MD  DOB: 1935-07-28  DOS: 03/18/2020  Location: [facility]  MRN: [id number]  Setting: Ambulatory - outpatient  Referring Provider: Einar Pheasant, MD  Type: Established Patient  Specialty: Interventional Pain Management  PCP: Einar Pheasant, MD   Primary Reason for Visit: Interventional Pain Management Treatment. CC: Back Pain (right) and Hip Pain (right)  Procedure:          Anesthesia, Analgesia, Anxiolysis:  Type: Diagnostic Sacroiliac Joint Steroid Injection #2  Region: Superior Lumbosacral Region Level: PIIS (Posterior Inferior Iliac Spine) Laterality: Right  Type: Moderate (Conscious) Sedation combined with Local Anesthesia Indication(s): Analgesia and Anxiety Route: Intravenous (IV) IV Access: Secured Sedation: Meaningful verbal contact was maintained at all times during the procedure  Local Anesthetic: Lidocaine 1-2%  Position: Prone           Indications: 1. Chronic sacroiliac joint pain (Right)   2. Sacroiliac joint dysfunction (Right)   3. Somatic dysfunction of sacroiliac joint (Right)   4. Other spondylosis, sacral and sacrococcygeal region   5. Chronic low back pain (Primary Area of Pain) (Bilateral) (R>L)    Pain Score: Pre-procedure: 9 /10 Post-procedure: 0-No pain/10   Pre-op Assessment:  Wanda Martinez is a 85 y.o. (year old), female patient, seen today for interventional treatment. She  has a past surgical history that includes  Breast biopsy (2013); Tonsillectomy and adenoidectomy (79); Vesicovaginal fistula closure w/ TAH (1983); Breast surgery (1986); Breast enhancement surgery (1987); Breast implant removal; Breast implant removal (Right, 08/29/2012); Mastectomy (08/2012); Abdominal hysterectomy; and Colonoscopy with propofol (N/A, 09/13/2016). Wanda Martinez has a current medication list which includes the following prescription(s): amlodipine, aspirin, calcium carbonate, citalopram, cyclobenzaprine, gabapentin, glucosamine sulfate, hydrochlorothiazide, hydrocodone-acetaminophen, letrozole, losartan, lubiprostone, magnesium oxide, meloxicam, metoprolol succinate, multivitamin, nystatin, omega-3, omeprazole, pravastatin, hydrocodone-acetaminophen, hydrocodone-acetaminophen, and pantoprazole, and the following Facility-Administered Medications: fentanyl and midazolam. Her primarily concern today is the Back Pain (right) and Hip Pain (right)  Initial Vital Signs:  Pulse/HCG Rate: 70ECG Heart Rate: 65 Temp: 98 F (36.7 C) Resp: 18 BP: 137/77 SpO2: 100 %  BMI: Estimated body mass index is 31.76 kg/m as calculated from the following:   Height as of this encounter: 5\' 4"  (1.626 m).   Weight as of this encounter: 185 lb (83.9 kg).  Risk Assessment: Allergies: Reviewed. She is allergic to sulfa antibiotics and vesicare [solifenacin].  Allergy Precautions: None required Coagulopathies: Reviewed. None identified.  Blood-thinner therapy: None at this time Active Infection(s): Reviewed. None identified. Wanda Martinez is afebrile  Site Confirmation: Wanda Martinez was asked to confirm the procedure and laterality before marking the site Procedure checklist: Completed Consent: Before the procedure and under the influence of no sedative(s), amnesic(s), or anxiolytics, the patient was informed of the treatment options, risks and possible complications. To fulfill our ethical and legal obligations, as recommended by the American Medical  Association's Code of Ethics, I have informed the patient of my clinical impression; the nature and purpose of the treatment or procedure; the risks, benefits, and possible complications of the intervention; the alternatives, including doing nothing; the risk(s) and benefit(s) of the  alternative treatment(s) or procedure(s); and the risk(s) and benefit(s) of doing nothing. The patient was provided information about the general risks and possible complications associated with the procedure. These may include, but are not limited to: failure to achieve desired goals, infection, bleeding, organ or nerve damage, allergic reactions, paralysis, and death. In addition, the patient was informed of those risks and complications associated to the procedure, such as failure to decrease pain; infection; bleeding; organ or nerve damage with subsequent damage to sensory, motor, and/or autonomic systems, resulting in permanent pain, numbness, and/or weakness of one or several areas of the body; allergic reactions; (i.e.: anaphylactic reaction); and/or death. Furthermore, the patient was informed of those risks and complications associated with the medications. These include, but are not limited to: allergic reactions (i.e.: anaphylactic or anaphylactoid reaction(s)); adrenal axis suppression; blood sugar elevation that in diabetics may result in ketoacidosis or comma; water retention that in patients with history of congestive heart failure may result in shortness of breath, pulmonary edema, and decompensation with resultant heart failure; weight gain; swelling or edema; medication-induced neural toxicity; particulate matter embolism and blood vessel occlusion with resultant organ, and/or nervous system infarction; and/or aseptic necrosis of one or more joints. Finally, the patient was informed that Medicine is not an exact science; therefore, there is also the possibility of unforeseen or unpredictable risks and/or possible  complications that may result in a catastrophic outcome. The patient indicated having understood very clearly. We have given the patient no guarantees and we have made no promises. Enough time was given to the patient to ask questions, all of which were answered to the patient's satisfaction. Wanda Martinez has indicated that she wanted to continue with the procedure. Attestation: I, the ordering provider, attest that I have discussed with the patient the benefits, risks, side-effects, alternatives, likelihood of achieving goals, and potential problems during recovery for the procedure that I have provided informed consent. Date  Time: 03/18/2020  8:56 AM  Pre-Procedure Preparation:  Monitoring: As per clinic protocol. Respiration, ETCO2, SpO2, BP, heart rate and rhythm monitor placed and checked for adequate function Safety Precautions: Patient was assessed for positional comfort and pressure points before starting the procedure. Time-out: I initiated and conducted the "Time-out" before starting the procedure, as per protocol. The patient was asked to participate by confirming the accuracy of the "Time Out" information. Verification of the correct person, site, and procedure were performed and confirmed by me, the nursing staff, and the patient. "Time-out" conducted as per Joint Commission's Universal Protocol (UP.01.01.01). Time: 0941  Description of Procedure:          Target Area: Inferior, posterior, aspect of the sacroiliac fissure Approach: Posterior, paraspinal, ipsilateral approach. Area Prepped: Entire Lower Lumbosacral Region DuraPrep (Iodine Povacrylex [0.7% available iodine] and Isopropyl Alcohol, 74% w/w) Safety Precautions: Aspiration looking for blood return was conducted prior to all injections. At no point did we inject any substances, as a needle was being advanced. No attempts were made at seeking any paresthesias. Safe injection practices and needle disposal techniques used.  Medications properly checked for expiration dates. SDV (single dose vial) medications used. Description of the Procedure: Protocol guidelines were followed. The patient was placed in position over the procedure table. The target area was identified and the area prepped in the usual manner. Skin & deeper tissues infiltrated with local anesthetic. Appropriate amount of time allowed to pass for local anesthetics to take effect. The procedure needle was advanced under fluoroscopic guidance into the sacroiliac joint until  a firm endpoint was obtained. Proper needle placement secured. Negative aspiration confirmed. Solution injected in intermittent fashion, asking for systemic symptoms every 0.5cc of injectate. The needles were then removed and the area cleansed, making sure to leave some of the prepping solution back to take advantage of its long term bactericidal properties.  Vitals:   03/18/20 0942 03/18/20 0951 03/18/20 1001 03/18/20 1011  BP: (!) 153/77 (!) 153/86 139/83 (!) 159/90  Pulse:      Resp: 15 16 16 17   Temp:  (!) 97.4 F (36.3 C)  97.6 F (36.4 C)  TempSrc:  Temporal  Temporal  SpO2: 100% 100% 100% 100%  Weight:      Height:        Start Time: 0941 hrs. End Time: 0942 hrs. Materials:  Needle(s) Type: Spinal Needle Gauge: 22G Length: 3.5-in Medication(s): Please see orders for medications and dosing details.  Imaging Guidance (Non-Spinal):          Type of Imaging Technique: Fluoroscopy Guidance (Non-Spinal) Indication(s): Assistance in needle guidance and placement for procedures requiring needle placement in or near specific anatomical locations not easily accessible without such assistance. Exposure Time: Please see nurses notes. Contrast: Before injecting any contrast, we confirmed that the patient did not have an allergy to iodine, shellfish, or radiological contrast. Once satisfactory needle placement was completed at the desired level, radiological contrast was injected.  Contrast injected under live fluoroscopy. No contrast complications. See chart for type and volume of contrast used. Fluoroscopic Guidance: I was personally present during the use of fluoroscopy. "Tunnel Vision Technique" used to obtain the best possible view of the target area. Parallax error corrected before commencing the procedure. "Direction-depth-direction" technique used to introduce the needle under continuous pulsed fluoroscopy. Once target was reached, antero-posterior, oblique, and lateral fluoroscopic projection used confirm needle placement in all planes. Images permanently stored in EMR.    Interpretation: I personally interpreted the imaging intraoperatively. Adequate needle placement confirmed in multiple planes. Appropriate spread of contrast into desired area was observed. No evidence of afferent or efferent intravascular uptake. Permanent images saved into the patient's record.  Antibiotic Prophylaxis:   Anti-infectives (From admission, onward)   None     Indication(s): None identified  Post-operative Assessment:  Post-procedure Vital Signs:  Pulse/HCG Rate: 7064 Temp: 97.6 F (36.4 C) Resp: 17 BP: (!) 159/90 SpO2: 100 %  EBL: None  Complications: No immediate post-treatment complications observed by team, or reported by patient.  Note: The patient tolerated the entire procedure well. A repeat set of vitals were taken after the procedure and the patient was kept under observation following institutional policy, for this type of procedure. Post-procedural neurological assessment was performed, showing return to baseline, prior to discharge. The patient was provided with post-procedure discharge instructions, including a section on how to identify potential problems. Should any problems arise concerning this procedure, the patient was given instructions to immediately contact us, at any time, without hesitation. In any case, we plan to contact the patient by telephone for  a follow-up status report regarding this interventional procedure.  Comments:  No additional relevant information.  Plan of Care  Orders:  Orders Placed This Encounter  Procedures  . SACROILIAC JOINT INJECTION    Scheduling Instructions:     Side: Right-sided     Sedation: With Sedation.     Timeframe: Today    Order Specific Question:   Where will this procedure be performed?    Answer:   [facility]  .  [facility] C-ARM 1-60 MIN NO REPORT    Intraoperative interpretation by procedural physician at [place].    Standing Status:   Standing    Number of Occurrences:   1    Order Specific Question:   Reason for exam:    Answer:   Assistance in needle guidance and placement for procedures requiring needle placement in or near specific anatomical locations not easily accessible without such assistance.  . Informed Consent Details: Physician/Practitioner Attestation; Transcribe to consent form and obtain patient signature    Provider Attestation: I, [place] Dossie Arbour, MD, (Pain Management Specialist), the physician/practitioner, attest that I have discussed with the patient the benefits, risks, side effects, alternatives, likelihood of achieving goals and potential problems during recovery for the procedure that I have provided informed consent.    Scheduling Instructions:     Procedure: Sacroiliac Joint Block     Indication/Reason: Chronic Low Back and Hip Pain secondary to Sacroiliac Joint Pain (Arthralgia/Arthropathy)     Nursing Order: Transcribe to consent form and obtain patient signature.     Note: Always confirm laterality of pain with Wanda Martinez, before procedure.  . Provide equipment / supplies at bedside    Equipment required: Single use, disposable, "Block Tray"    Standing Status:   Standing    Number of Occurrences:   1    Order Specific Question:   Specify    Answer:   Block Tray   Chronic Opioid Analgesic:  Hydrocodone/APAP 5/325, 1 tab PO q  8 hrs (15 mg/day of hydrocodone) MME/day:15mg /day.   Medications ordered for procedure: Meds ordered this encounter  Medications  . lactated ringers infusion 1,000 mL  . midazolam (VERSED) 5 MG/5ML injection 1-2 mg    Make sure Flumazenil is available in the pyxis when using this medication. If oversedation occurs, administer 0.2 mg IV over 15 sec. If after 45 sec no response, administer 0.2 mg again over 1 min; may repeat at 1 min intervals; not to exceed 4 doses (1 mg)  . fentaNYL (SUBLIMAZE) injection 25-50 mcg    Make sure Narcan is available in the pyxis when using this medication. In the event of respiratory depression (RR< 8/min): Titrate NARCAN (naloxone) in increments of 0.1 to 0.2 mg IV at 2-3 minute intervals, until desired degree of reversal.  . ropivacaine (PF) 2 mg/mL (0.2%) (NAROPIN) injection 4 mL  . methylPREDNISolone acetate (DEPO-MEDROL) injection 80 mg  . lidocaine (XYLOCAINE) 2 % (with pres) injection 400 mg   Medications administered: We administered lactated ringers, midazolam, fentaNYL, ropivacaine (PF) 2 mg/mL (0.2%), methylPREDNISolone acetate, and lidocaine.  See the medical record for exact dosing, route, and time of administration.  Follow-up plan:   Return in about 2 weeks (around 04/01/2020) for (VV), (PP).       Interventional treatment options:  Under consideration: Diagnosticbilateral cervical facet block  Possiblebilateral cervical facet RFA  Palliative right-sided L5 TFESI #4 + right-sided L4-5 LESI #1 vs  Diagnostic right IA hip injection #2 vs Palliative right lumbar facet #7    Therapeutic/palliative (PRN): Diagnostic right CESI #2  Palliative bilateral trochanteric bursa injection #2  Diagnosticright IA hip joint injection #2  Palliative right lumbar facet block #7  Palliative left lumbar facet block #4  Palliative right lumbar facet RFA #3(last done06/18/19) Palliative left lumbar facet RFA #2 (last done 02/12/2018)  Diagnostic/Therapeutic right SI block #2  Diagnostic/Therapeutic left SI block #2  Palliative right S-I RFA #4 (last done 06/05/2019) Palliative right suprascapular  muscle TPI/MNB #3  Palliativeright L3-4 LESI#3 Palliative right L5 TFESI #4 Diagnostic right L5-S1 LESI #3     Recent Visits Date Type Provider Dept  01/13/20 Telemedicine Milinda Pointer, MD [facility]  12/22/19 Telemedicine Milinda Pointer, MD [facility]  Showing recent visits within past 90 days and meeting all other requirements   Today's Visits Date Type Provider Dept  03/18/20 Procedure visit Milinda Pointer, MD [facility]  Showing today's visits and meeting all other requirements   Future Appointments Date Type Provider Dept  03/24/20 Appointment Milinda Pointer, MD [facility]  04/12/20 Appointment Milinda Pointer, MD [facility]  Showing future appointments within next 90 days and meeting all other requirements   Disposition: Discharge home  Discharge (Date  Time): 03/18/2020; 1014 hrs.   Primary Care Physician: Einar Pheasant, MD Location: [facility] Note by: Gaspar Cola, MD Date: 03/18/2020; Time: 2:33 PM  Disclaimer:  Medicine is not an Chief Strategy Officer. The only guarantee in medicine is that nothing is guaranteed. It is important to note that the decision to proceed with this intervention was based on the information collected from the patient. The Data and conclusions were drawn from the patient's questionnaire, the interview, and the physical examination. Because the information was provided in large part by the patient, it cannot be guaranteed that it has not been purposely or unconsciously manipulated. Every effort has been made to obtain as much relevant data as possible for this evaluation. It is important to note that the conclusions that lead to this procedure are derived in large part from the  available data. Always take into account that the treatment will also be dependent on availability of resources and existing treatment guidelines, considered by other Pain Management Practitioners as being common knowledge and practice, at the time of the intervention. For Medico-Legal purposes, it is also important to point out that variation in procedural techniques and pharmacological choices are the acceptable norm. The indications, contraindications, technique, and results of the above procedure should only be interpreted and judged by a Board-Certified Interventional Pain Specialist with extensive familiarity and expertise in the same exact procedure and technique.

## 2020-03-18 NOTE — Patient Instructions (Signed)

## 2020-03-19 ENCOUNTER — Telehealth: Payer: Self-pay | Admitting: *Deleted

## 2020-03-19 DIAGNOSIS — Z79899 Other long term (current) drug therapy: Secondary | ICD-10-CM | POA: Diagnosis not present

## 2020-03-19 DIAGNOSIS — I129 Hypertensive chronic kidney disease with stage 1 through stage 4 chronic kidney disease, or unspecified chronic kidney disease: Secondary | ICD-10-CM | POA: Diagnosis not present

## 2020-03-19 DIAGNOSIS — Z9012 Acquired absence of left breast and nipple: Secondary | ICD-10-CM | POA: Diagnosis not present

## 2020-03-19 DIAGNOSIS — Z90710 Acquired absence of both cervix and uterus: Secondary | ICD-10-CM | POA: Diagnosis not present

## 2020-03-19 DIAGNOSIS — Z853 Personal history of malignant neoplasm of breast: Secondary | ICD-10-CM | POA: Diagnosis not present

## 2020-03-19 DIAGNOSIS — K219 Gastro-esophageal reflux disease without esophagitis: Secondary | ICD-10-CM | POA: Diagnosis not present

## 2020-03-19 DIAGNOSIS — R200 Anesthesia of skin: Secondary | ICD-10-CM | POA: Diagnosis not present

## 2020-03-19 DIAGNOSIS — R300 Dysuria: Secondary | ICD-10-CM | POA: Diagnosis not present

## 2020-03-19 DIAGNOSIS — R32 Unspecified urinary incontinence: Secondary | ICD-10-CM | POA: Diagnosis not present

## 2020-03-19 DIAGNOSIS — E785 Hyperlipidemia, unspecified: Secondary | ICD-10-CM | POA: Diagnosis not present

## 2020-03-19 DIAGNOSIS — Z882 Allergy status to sulfonamides status: Secondary | ICD-10-CM | POA: Diagnosis not present

## 2020-03-19 DIAGNOSIS — N390 Urinary tract infection, site not specified: Secondary | ICD-10-CM | POA: Diagnosis not present

## 2020-03-19 DIAGNOSIS — N182 Chronic kidney disease, stage 2 (mild): Secondary | ICD-10-CM | POA: Diagnosis not present

## 2020-03-19 NOTE — Telephone Encounter (Signed)
Spoke with patient and she reports that she had some pain last evening and had to take a pain pill.  We discussed steroids and how they work and she was aware.  Encouraged heat today to increased blood flow.  Patient verbalizes u/o information.

## 2020-03-19 NOTE — Telephone Encounter (Signed)
Attempted to reach patient, no answer and no voice mail.

## 2020-03-23 ENCOUNTER — Other Ambulatory Visit: Payer: Medicare Other

## 2020-03-23 ENCOUNTER — Encounter: Payer: Self-pay | Admitting: Pain Medicine

## 2020-03-23 NOTE — Progress Notes (Signed)
Patient: Wanda Martinez  Service Category: E/M  Provider: Gaspar Cola, MD  DOB: [birth year]  DOS: 03/24/2020  Location: Office  MRN: [id number]  Setting: Ambulatory outpatient  Referring Provider: Einar Pheasant, MD  Type: Established Patient  Specialty: Interventional Pain Management  PCP: Einar Pheasant, MD  Location: Remote location  Delivery: TeleHealth     Virtual Encounter - Pain Management PROVIDER NOTE: Information contained herein reflects review and annotations entered in association with encounter. Interpretation of such information and data should be left to medically-trained personnel. Information provided to patient can be located elsewhere in the medical record under "Patient Instructions". Document created using STT-dictation technology, any transcriptional errors that may result from process are unintentional.    Contact & Pharmacy Preferred: [phone] Home: [phone] (home) Mobile: [phone] (mobile) E-mail: No e-mail address on record  [place], [place] - [address] [place] [address] Phone: [phone] Fax: [phone]   Pre-screening  Wanda Martinez offered "in-person" vs "virtual" encounter. She indicated preferring virtual for this encounter.   Reason COVID-19*  Social distancing based on CDC and AMA recommendations.   I contacted Wanda Martinez on 03/24/2020 via telephone.      I clearly identified myself as Gaspar Cola, MD. I verified that I was speaking with the correct person using two identifiers (Name: Wanda Martinez, and date of birth: [birth year]).  Consent I sought verbal advanced consent from Wanda Martinez for virtual visit interactions. I informed Wanda Martinez of possible security and privacy concerns, risks, and limitations associated with providing "not-in-person" medical evaluation and management services. I also informed Wanda Martinez of the availability of  "in-person" appointments. Finally, I informed her that there would be a charge for the virtual visit and that she could be  personally, fully or partially, financially responsible for it. Wanda Martinez expressed understanding and agreed to proceed.   Historic Elements   Wanda Martinez is a 85 y.o. year old, female patient evaluated today after her last contact with our practice on 03/19/2020. Wanda Martinez  has a past medical history of Acute postoperative pain (10/25/2017), Anemia, Arm pain (07/26/2015), Arthritis, Arthritis, degenerative (03/26/2014), Back pain (11/01/2013), Bladder infection (06/2018), Breast cancer ([place]), Breast cancer ([place]), CHEST PAIN (04/29/2010), Chronic cystitis, Cystocele (02/22/2013), Cystocele, midline (08/19/2013), Degeneration of intervertebral disc of lumbosacral region (03/26/2014), DYSPNEA (04/29/2010), Enthesopathy of hip (03/26/2014), GERD (gastroesophageal reflux disease), Hiatal hernia, HTN (hypertension), Hypokalemia (06/2018), Hyponatremia (06/2018), LBP (low back pain) (03/26/2014), Neck pain (11/01/2013), Parkinson disease ([place]), Pneumonia (06/2018), Sinusitis (02/07/2015), Skin lesions (07/12/2014), and Urinary incontinence. She also  has a past surgical history that includes Breast biopsy (2013); Tonsillectomy and adenoidectomy (79); Vesicovaginal fistula closure w/ TAH (1983); Breast surgery (1986); Breast enhancement surgery (1987); Breast implant removal; Breast implant removal (Right, 08/29/2012); Mastectomy (08/2012); Abdominal hysterectomy; and Colonoscopy with propofol (N/A, 09/13/2016). Wanda Martinez has a current medication list which includes the following prescription(s): amlodipine, aspirin, calcium carbonate, citalopram, cyclobenzaprine, gabapentin, glucosamine sulfate, hydrochlorothiazide, [START ON 03/27/2020] hydrocodone-acetaminophen, [START ON 04/26/2020] hydrocodone-acetaminophen, [START ON 05/26/2020] hydrocodone-acetaminophen, letrozole, losartan, lubiprostone, magnesium  oxide, meloxicam, metoprolol succinate, multivitamin, nitrofurantoin (macrocrystal-monohydrate), nystatin, omega-3, omeprazole, pantoprazole, and pravastatin. She  reports that she has never smoked. She has never used smokeless tobacco. She reports that she does not drink alcohol or use drugs. Wanda Martinez is allergic to sulfa antibiotics and vesicare [solifenacin].   HPI  Today, she is being contacted for both, medication management and a post-procedure assessment. The patient  indicates doing well with the current medication regimen. No adverse reactions or side effects reported to the medications.  She indicates still having quite a bit of pain and she is interested in more injections, but at this point she needs to get over this UTI says it could be responsible for some of the pain that she is experiencing.  I have instructed the patient to give [place] a call once she is completely cleared from the UTI at which time we will then reevaluate her condition and determine what is necessary.  Post-Procedure Evaluation  Procedure (03/18/2020): Diagnostic right sacroiliac joint block #2 under fluoroscopic guidance and IV sedation Pre-procedure pain level:  9/10 Post-procedure: 0/10          Sedation: Please see nurses note.  Dewayne Shorter, RN  03/23/2020  1:41 PM  Signed Pain relief after procedure (treated area only): (Questions asked to patient) 1. Starting about 15 minutes after the procedure, and "while the area was still numb" (from the local anesthetics), were you having any of your usual pain "in that area" (the treated area)?  (NOTE: NOT including the discomfort from the needle sticks.) First 1 hour:75 % better. First 4-6 hours:75 % better.   3. How much better is your pain now, when compared to before the procedure? Current benefit: 0 % better. Patient went to ED the next day and was rediagnosed with UTI and is unsure if this is contributing to pain.  4. Can you move better now? Improvement in ROM  (Range of Motion): No. 5. Can you do more now? Improvement in function: No. 4. Did you have any problems with the procedure? Side-effects/Complications: No.  Current benefits: Defined as benefit that persist at this time.   Analgesia:  Back to baseline Function: Back to baseline ROM: Back to baseline  Pharmacotherapy Assessment  Analgesic: Hydrocodone/APAP 5/325, 1 tab PO q 8 hrs (15 mg/day of hydrocodone) MME/day:53m/day.   Monitoring: [place] PMP: PDMP reviewed during this encounter.       Pharmacotherapy: No side-effects or adverse reactions reported. Compliance: No problems identified. Effectiveness: Clinically acceptable. Plan: Refer to "POC".  UDS:  Summary  Date Value Ref Range Status  01/08/2019 FINAL  Final    Comment:    ==================================================================== TOXASSURE SELECT 13 (MW) ==================================================================== Test                             Result       Flag       Units Drug Present and Declared for Prescription Verification   Hydrocodone                    1944         EXPECTED   ng/mg creat   Dihydrocodeine                 194          EXPECTED   ng/mg creat   Norhydrocodone                 2532         EXPECTED   ng/mg creat    Sources of hydrocodone include scheduled prescription    medications. Dihydrocodeine and norhydrocodone are expected    metabolites of hydrocodone. Dihydrocodeine is also available as a    scheduled prescription medication. ==================================================================== Test  Result    Flag   Units      Ref Range   Creatinine              188              mg/dL      >=20 ==================================================================== Declared Medications:  The flagging and interpretation on this report are based on the  following declared medications.  Unexpected results may arise from  inaccuracies in the declared  medications.  **Note: The testing scope of this panel includes these medications:  Hydrocodone (Hydrocodone-Acetaminophen)  **Note: The testing scope of this panel does not include following  reported medications:  Acetaminophen (Hydrocodone-Acetaminophen)  Acyclovir  Amlodipine Besylate  Aspirin  Calcium carbonate  Citalopram  Cyclobenzaprine  Gabapentin  Glucosamine  Hydrochlorothiazide  Letrozole  Losartan (Losartan Potassium)  Lubiprostone  Magnesium Oxide  Meloxicam  Metoprolol  Multivitamin  Omega-3 Fatty Acids  Omeprazole  Pantoprazole  Pravastatin ==================================================================== For clinical consultation, please call [phone]. ====================================================================    Laboratory Chemistry Profile   Renal Lab Results  Component Value Date   BUN 26 (H) 01/27/2020   CREATININE 1.02 (H) 01/27/2020   BCR 12 01/08/2019   GFR 44.26 (L) 01/05/2020   GFRAA 59 (L) 01/27/2020   GFRNONAA 51 (L) 01/27/2020     Hepatic Lab Results  Component Value Date   AST 28 10/17/2019   ALT 20 10/17/2019   ALBUMIN 4.4 10/17/2019   ALKPHOS 50 10/17/2019     Electrolytes Lab Results  Component Value Date   NA 135 01/27/2020   K 4.9 01/27/2020   CL 101 01/27/2020   CALCIUM 9.2 01/27/2020   MG 2.2 07/17/2018     Bone Lab Results  Component Value Date   25OHVITD1 42 07/17/2018   25OHVITD2 <1.0 07/17/2018   25OHVITD3 42 07/17/2018     Inflammation (CRP: Acute Phase) (ESR: Chronic Phase) Lab Results  Component Value Date   CRP 3 07/17/2018   ESRSEDRATE 27 07/17/2018       Note: Above Lab results reviewed.  Imaging  DG PAIN CLINIC C-ARM 1-60 MIN NO REPORT Fluoro was used, but no Radiologist interpretation will be provided.  Please refer to "NOTES" tab for provider progress note.  Assessment  The primary encounter diagnosis was Chronic low back pain (Primary Area of Pain) (Bilateral)  (R>L). 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  Plan of Care  Problem-specific:  No problem-specific Assessment & Plan notes found for this encounter.  Wanda Martinez has a current medication list which includes the following long-term medication(s): amlodipine, calcium carbonate, cyclobenzaprine, gabapentin, hydrochlorothiazide, [START ON 03/27/2020] hydrocodone-acetaminophen, [START ON 04/26/2020] hydrocodone-acetaminophen, [START ON 05/26/2020] hydrocodone-acetaminophen, losartan, magnesium oxide, meloxicam, metoprolol succinate, omeprazole, pantoprazole, and pravastatin.  Pharmacotherapy (Medications Ordered): Meds ordered this encounter  Medications  . HYDROcodone-acetaminophen (NORCO/VICODIN) 5-325 MG tablet    Sig: Take 1 tablet by mouth every 8 (eight) hours as needed for severe pain. Must last 30 days    Dispense:  90 tablet    Refill:  0    Chronic Pain: STOP Act (Not applicable) Fill 1 day early if closed on refill date. Do not fill until: 03/27/2020. To last until: 04/26/2020. Avoid benzodiazepines within 8 hours of opioids  . HYDROcodone-acetaminophen (NORCO/VICODIN) 5-325 MG tablet    Sig: Take 1 tablet by  mouth every 8 (eight) hours as needed for severe pain. Must last 30 days    Dispense:  90 tablet    Refill:  0    Chronic Pain: STOP Act (Not applicable) Fill 1 day early if closed on refill date. Do not fill until: 04/26/2020. To last until: 05/26/2020. Avoid benzodiazepines within 8 hours of opioids  . HYDROcodone-acetaminophen (NORCO/VICODIN) 5-325 MG tablet    Sig: Take 1 tablet by mouth every 8 (eight) hours as needed for severe pain. Must last 30 days    Dispense:  90 tablet    Refill:  0    Chronic Pain: STOP Act (Not applicable) Fill 1 day early if  closed on refill date. Do not fill until: 05/26/2020. To last until: 06/25/2020. Avoid benzodiazepines within 8 hours of opioids   Orders:  No orders of the defined types were placed in this encounter.  Follow-up plan:   Return in about 3 months (around 06/23/2020) for (F2F), (MM).      Interventional treatment options:  Under consideration: Diagnosticbilateral cervical facet block  Possiblebilateral cervical facet RFA  Palliative right-sided L5 TFESI #4 + right-sided L4-5 LESI #1 vs  Diagnostic right IA hip injection #2 vs Palliative right lumbar facet #7    Therapeutic/palliative (PRN): Diagnostic right CESI #2  Palliative bilateral trochanteric bursa injection #2  Diagnosticright IA hip joint injection #2  Palliative right lumbar facet block #7  Palliative left lumbar facet block #4  Palliative right lumbar facet RFA #3(last done06/18/19) Palliative left lumbar facet RFA #2 (last done 02/12/2018) Diagnostic/Therapeutic right SI block #2  Diagnostic/Therapeutic left SI block #2  Palliative right S-I RFA #4 (last done 06/05/2019) Palliative right suprascapular muscle TPI/MNB #3  Palliativeright L3-4 LESI#3 Palliative right L5 TFESI #4 Diagnostic right L5-S1 LESI #3      Recent Visits Date Type Provider Dept  03/18/20 Procedure visit Milinda Pointer, MD [facility]  01/13/20 Telemedicine Milinda Pointer, MD [facility]  Showing recent visits within past 90 days and meeting all other requirements   Today's Visits Date Type Provider Dept  03/24/20 Telemedicine Milinda Pointer, MD [facility]  Showing today's visits and meeting all other requirements   Future Appointments Date Type Provider Dept  04/12/20 Appointment Milinda Pointer, MD [facility]  Showing future appointments within next 90 days and meeting all other requirements   I discussed the assessment and treatment plan with the patient. The patient was  provided an opportunity to ask questions and all were answered. The patient agreed with the plan and demonstrated an understanding of the instructions.  Patient advised to call back or seek an in-person evaluation if the symptoms or condition worsens.  Duration of encounter: 15 minutes.  Note by: Gaspar Cola, MD Date: 03/24/2020; Time: 1:07 PM

## 2020-03-23 NOTE — Progress Notes (Signed)
Pain relief after procedure (treated area only): (Questions asked to patient) 1. Starting about 15 minutes after the procedure, and "while the area was still numb" (from the local anesthetics), were you having any of your usual pain "in that area" (the treated area)?  (NOTE: NOT including the discomfort from the needle sticks.) First 1 hour:75 % better. First 4-6 hours:75 % better.   3. How much better is your pain now, when compared to before the procedure? Current benefit: 0 % better. Patient went to ED the next day and was rediagnosed with UTI and is unsure if this is contributing to pain.  4. Can you move better now? Improvement in ROM (Range of Motion): No. 5. Can you do more now? Improvement in function: No. 4. Did you have any problems with the procedure? Side-effects/Complications: No.

## 2020-03-23 NOTE — Progress Notes (Deleted)
Pain relief after procedure (treated area only): (Questions asked to patient) 1. Starting about 15 minutes after the procedure, and "while the area was still numb" (from the local anesthetics), were you having any of your usual pain "in that area" (the treated area)?  (NOTE: NOT including the discomfort from the needle sticks.) First 1 hour: 75% better. First 4-6 hours: 75 % better. 3. How much better is your pain now, when compared to before the procedure? Current benefit: 0 % better. Patient has a continued UTI, restarted on medication.  Not sure if this is making pain worse.  4. Can you move better now? Improvement in ROM (Range of Motion): No. 5. Can you do more now? Improvement in function: No. 4. Did you have any problems with the procedure? Side-effects/Complications: No.

## 2020-03-24 ENCOUNTER — Other Ambulatory Visit: Payer: Self-pay

## 2020-03-24 ENCOUNTER — Ambulatory Visit: Payer: Medicare Other | Attending: Pain Medicine | Admitting: Pain Medicine

## 2020-03-24 DIAGNOSIS — M48062 Spinal stenosis, lumbar region with neurogenic claudication: Secondary | ICD-10-CM

## 2020-03-24 DIAGNOSIS — G894 Chronic pain syndrome: Secondary | ICD-10-CM

## 2020-03-24 DIAGNOSIS — M79604 Pain in right leg: Secondary | ICD-10-CM

## 2020-03-24 DIAGNOSIS — M545 Low back pain: Secondary | ICD-10-CM

## 2020-03-24 DIAGNOSIS — M4807 Spinal stenosis, lumbosacral region: Secondary | ICD-10-CM

## 2020-03-24 DIAGNOSIS — G8929 Other chronic pain: Secondary | ICD-10-CM

## 2020-03-24 MED ORDER — HYDROCODONE-ACETAMINOPHEN 5-325 MG PO TABS: 1.0000 | ORAL_TABLET | Freq: Three times a day (TID) | ORAL | 0 refills | Status: DC | PRN

## 2020-04-12 ENCOUNTER — Telehealth: Payer: Medicare Other | Admitting: Pain Medicine

## 2020-04-17 ENCOUNTER — Other Ambulatory Visit: Payer: Self-pay | Admitting: Internal Medicine

## 2020-05-10 ENCOUNTER — Telehealth: Payer: Self-pay

## 2020-05-10 NOTE — Telephone Encounter (Signed)
Please advise 

## 2020-05-10 NOTE — Telephone Encounter (Addendum)
She is having a lot of low back pain going into her hip. She wants a procedure. Should I schedule an SIJI or lesi? Need an order put in

## 2020-05-20 ENCOUNTER — Ambulatory Visit (HOSPITAL_BASED_OUTPATIENT_CLINIC_OR_DEPARTMENT_OTHER): Payer: Medicare Other | Admitting: Pain Medicine

## 2020-05-20 ENCOUNTER — Ambulatory Visit
Admission: RE | Admit: 2020-05-20 | Discharge: 2020-05-20 | Disposition: A | Discharge status: Home / Self Care | Payer: Medicare Other | Source: Ambulatory Visit | Attending: Pain Medicine | Admitting: Pain Medicine

## 2020-05-20 ENCOUNTER — Encounter: Payer: Self-pay | Admitting: Pain Medicine

## 2020-05-20 ENCOUNTER — Ambulatory Visit: Payer: Medicare Other | Admitting: Oncology

## 2020-05-20 ENCOUNTER — Other Ambulatory Visit: Payer: Self-pay

## 2020-05-20 VITALS — BP 125/74 | HR 64 | Temp 97.9°F | Resp 16 | Ht 64.0 in | Wt 179.0 lb

## 2020-05-20 DIAGNOSIS — M4807 Spinal stenosis, lumbosacral region: Secondary | ICD-10-CM

## 2020-05-20 DIAGNOSIS — M79604 Pain in right leg: Secondary | ICD-10-CM | POA: Insufficient documentation

## 2020-05-20 DIAGNOSIS — M5410 Radiculopathy, site unspecified: Secondary | ICD-10-CM | POA: Insufficient documentation

## 2020-05-20 DIAGNOSIS — M545 Low back pain: Secondary | ICD-10-CM | POA: Diagnosis not present

## 2020-05-20 DIAGNOSIS — M5137 Other intervertebral disc degeneration, lumbosacral region: Secondary | ICD-10-CM | POA: Diagnosis not present

## 2020-05-20 DIAGNOSIS — G8929 Other chronic pain: Secondary | ICD-10-CM | POA: Insufficient documentation

## 2020-05-20 MED ORDER — SODIUM CHLORIDE (PF) 0.9 % IJ SOLN
INTRAMUSCULAR | Status: AC
  Filled 2020-05-20: qty 10

## 2020-05-20 MED ORDER — ROPIVACAINE HCL 2 MG/ML IJ SOLN
INTRAMUSCULAR | Status: AC
  Filled 2020-05-20: qty 10

## 2020-05-20 MED ORDER — SODIUM CHLORIDE 0.9% FLUSH
2.0000 mL | Freq: Once | INTRAVENOUS | Status: DC
  Administered 2020-05-20: 2 mL

## 2020-05-20 MED ORDER — IOHEXOL 180 MG/ML  SOLN
10.0000 mL | Freq: Once | INTRAMUSCULAR | Status: AC
  Administered 2020-05-20: 10 mL via EPIDURAL

## 2020-05-20 MED ORDER — FENTANYL CITRATE (PF) 100 MCG/2ML IJ SOLN: 25.0000 ug | INTRAMUSCULAR | Status: DC | PRN

## 2020-05-20 MED ORDER — MIDAZOLAM HCL 5 MG/5ML IJ SOLN: 1.0000 mg | INTRAMUSCULAR | Status: DC | PRN

## 2020-05-20 MED ORDER — LIDOCAINE HCL 2 % IJ SOLN
INTRAMUSCULAR | Status: AC
  Filled 2020-05-20: qty 20

## 2020-05-20 MED ORDER — LIDOCAINE HCL 2 % IJ SOLN
20.0000 mL | Freq: Once | INTRAMUSCULAR | Status: AC
  Administered 2020-05-20: 400 mg

## 2020-05-20 MED ORDER — TRIAMCINOLONE ACETONIDE 40 MG/ML IJ SUSP
INTRAMUSCULAR | Status: AC
  Filled 2020-05-20: qty 1

## 2020-05-20 MED ORDER — TRIAMCINOLONE ACETONIDE 40 MG/ML IJ SUSP
40.0000 mg | Freq: Once | INTRAMUSCULAR | Status: AC
  Administered 2020-05-20: 40 mg

## 2020-05-20 MED ORDER — ROPIVACAINE HCL 2 MG/ML IJ SOLN
2.0000 mL | Freq: Once | INTRAMUSCULAR | Status: AC
  Administered 2020-05-20: 2 mL via EPIDURAL

## 2020-05-20 MED ORDER — LACTATED RINGERS IV SOLN: 1000.0000 mL | Freq: Once | INTRAVENOUS | Status: DC

## 2020-05-20 NOTE — Patient Instructions (Signed)

## 2020-05-20 NOTE — Progress Notes (Signed)
Safety precautions to be maintained throughout the outpatient stay will include: orient to surroundings, keep bed in low position, maintain call bell within reach at all times, provide assistance with transfer out of bed and ambulation.  

## 2020-05-20 NOTE — Progress Notes (Signed)
PROVIDER NOTE: Information contained herein reflects review and annotations entered in association with encounter. Interpretation of such information and data should be left to medically-trained personnel. Information provided to patient can be located elsewhere in the medical record under "Patient Instructions". Document created using STT-dictation technology, any transcriptional errors that may result from process are unintentional.    Patient: Wanda Martinez  Service Category: Procedure  Provider: Gaspar Cola, MD  DOB: 06-02-1935  DOS: 05/20/2020  Location: [facility]  MRN: [id number]  Setting: Ambulatory - outpatient  Referring Provider: Einar Pheasant, MD  Type: Established Patient  Specialty: Interventional Pain Management  PCP: Einar Pheasant, MD   Primary Reason for Visit: Interventional Pain Management Treatment. CC: Back Pain (low)  Procedure:          Anesthesia, Analgesia, Anxiolysis:  Type: Therapeutic Inter-Laminar Epidural Steroid Injection           Region: Lumbar Level: L4-5 Level. Laterality: Right Paramedial  Type: Local Anesthesia Indication(s): Analgesia         Route: Infiltration (Donald/IM) IV Access: Declined Sedation: Declined  Local Anesthetic: Lidocaine 1-2%  Position: Prone with head of the table was raised to facilitate breathing.   Indications: 1. DDD (degenerative disc disease), lumbosacral   2. Chronic low back pain (Primary Area of Pain) (Bilateral) (R>L)   3. Chronic lower extremity pain (Secondary Area of Pain) (Right)   4. Chronic lumbar radicular pain (S1) (Right)   5. 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 (year old), female patient, seen today for interventional treatment. She  has a past surgical history that includes Breast biopsy (2013); Tonsillectomy and adenoidectomy (79);  Vesicovaginal fistula closure w/ TAH (1983); Breast surgery (1986); Breast enhancement surgery (1987); Breast implant removal; Breast implant removal (Right, 08/29/2012); Mastectomy (08/2012); Abdominal hysterectomy; and Colonoscopy with propofol (N/A, 09/13/2016). Wanda Martinez has a current medication list which includes the following prescription(s): amlodipine, aspirin, calcium carbonate, citalopram, cyclobenzaprine, gabapentin, glucosamine sulfate, hydrochlorothiazide, hydrocodone-acetaminophen, [START ON 05/26/2020] hydrocodone-acetaminophen, letrozole, losartan, lubiprostone, magnesium oxide, meloxicam, metoprolol succinate, multivitamin, nystatin, omega-3, omeprazole, pantoprazole, pravastatin, donepezil, and hydrocodone-acetaminophen, and the following Facility-Administered Medications: lactated ringers. Her primarily concern today is the Back Pain (low)  Initial Vital Signs:  Pulse/HCG Rate: (!) 58  Temp: 97.9 F (36.6 C) Resp: 18 BP: 110/74 SpO2: 100 %  BMI: Estimated body mass index is 30.73 kg/m as calculated from the following:   Height as of this encounter: 5\' 4"  (1.626 m).   Weight as of this encounter: 179 lb (81.2 kg).  Risk Assessment: Allergies: Reviewed. She is allergic to sulfa antibiotics and vesicare [solifenacin].  Allergy Precautions: None required Coagulopathies: Reviewed. None identified.  Blood-thinner therapy: None at this time Active Infection(s): Reviewed. None identified. Wanda Martinez is afebrile  Site Confirmation: Wanda Martinez was asked to confirm the procedure and laterality before marking the site Procedure checklist: Completed Consent: Before the procedure and under the influence of no sedative(s), amnesic(s), or anxiolytics, the patient was informed of the treatment options, risks and possible complications. To fulfill our ethical and legal obligations, as recommended by the American Medical Association's Code of Ethics, I have informed the patient of my clinical  impression; the nature and purpose of the treatment or procedure; the risks, benefits, and possible complications of the intervention; the alternatives, including doing nothing; the risk(s) and benefit(s) of the alternative treatment(s) or  procedure(s); and the risk(s) and benefit(s) of doing nothing. The patient was provided information about the general risks and possible complications associated with the procedure. These may include, but are not limited to: failure to achieve desired goals, infection, bleeding, organ or nerve damage, allergic reactions, paralysis, and death. In addition, the patient was informed of those risks and complications associated to Spine-related procedures, such as failure to decrease pain; infection (i.e.: Meningitis, epidural or intraspinal abscess); bleeding (i.e.: epidural hematoma, subarachnoid hemorrhage, or any other type of intraspinal or peri-dural bleeding); organ or nerve damage (i.e.: Any type of peripheral nerve, nerve root, or spinal cord injury) with subsequent damage to sensory, motor, and/or autonomic systems, resulting in permanent pain, numbness, and/or weakness of one or several areas of the body; allergic reactions; (i.e.: anaphylactic reaction); and/or death. Furthermore, the patient was informed of those risks and complications associated with the medications. These include, but are not limited to: allergic reactions (i.e.: anaphylactic or anaphylactoid reaction(s)); adrenal axis suppression; blood sugar elevation that in diabetics may result in ketoacidosis or comma; water retention that in patients with history of congestive heart failure may result in shortness of breath, pulmonary edema, and decompensation with resultant heart failure; weight gain; swelling or edema; medication-induced neural toxicity; particulate matter embolism and blood vessel occlusion with resultant organ, and/or nervous system infarction; and/or aseptic necrosis of one or more  joints. Finally, the patient was informed that Medicine is not an exact science; therefore, there is also the possibility of unforeseen or unpredictable risks and/or possible complications that may result in a catastrophic outcome. The patient indicated having understood very clearly. We have given the patient no guarantees and we have made no promises. Enough time was given to the patient to ask questions, all of which were answered to the patient's satisfaction. Wanda Martinez has indicated that she wanted to continue with the procedure. Attestation: I, the ordering provider, attest that I have discussed with the patient the benefits, risks, side-effects, alternatives, likelihood of achieving goals, and potential problems during recovery for the procedure that I have provided informed consent. Date  Time: 05/20/2020  7:54 AM  Pre-Procedure Preparation:  Monitoring: As per clinic protocol. Respiration, ETCO2, SpO2, BP, heart rate and rhythm monitor placed and checked for adequate function Safety Precautions: Patient was assessed for positional comfort and pressure points before starting the procedure. Time-out: I initiated and conducted the "Time-out" before starting the procedure, as per protocol. The patient was asked to participate by confirming the accuracy of the "Time Out" information. Verification of the correct person, site, and procedure were performed and confirmed by me, the nursing staff, and the patient. "Time-out" conducted as per Joint Commission's Universal Protocol (UP.01.01.01). Time: [id number]  Description of Procedure:          Target Area: The interlaminar space, initially targeting the lower laminar border of the superior vertebral body. Approach: Paramedial approach. Area Prepped: Entire Posterior Lumbar Region DuraPrep (Iodine Povacrylex [0.7% available iodine] and Isopropyl Alcohol, 74% w/w) Safety Precautions: Aspiration looking for blood return was conducted prior to all injections.  At no point did we inject any substances, as a needle was being advanced. No attempts were made at seeking any paresthesias. Safe injection practices and needle disposal techniques used. Medications properly checked for expiration dates. SDV (single dose vial) medications used. Description of the Procedure: Protocol guidelines were followed. The procedure needle was introduced through the skin, ipsilateral to the reported pain, and advanced to the target area. Bone was contacted and  the needle walked caudad, until the lamina was cleared. The epidural space was identified using "loss-of-resistance technique" with 2-3 ml of PF-NaCl (0.9% NSS), in a 5cc LOR glass syringe.  Vitals:   05/20/20 0753 05/20/20 0829 05/20/20 0837 05/20/20 0843  BP: 110/74 (!) 145/86 129/74 125/74  Pulse: (!) 58 66 65 64  Resp: 18 16 15 16   Temp: 97.9 F (36.6 C)     SpO2: 100% 99% 99% 99%  Weight: 179 lb (81.2 kg)     Height: 5\' 4"  (1.626 m)       Start Time: 0833 hrs. End Time: 0840 hrs.  Materials:  Needle(s) Type: Epidural needle Gauge: 17G Length: 3.5-in Medication(s): Please see orders for medications and dosing details.  Imaging Guidance (Spinal):          Type of Imaging Technique: Fluoroscopy Guidance (Spinal) Indication(s): Assistance in needle guidance and placement for procedures requiring needle placement in or near specific anatomical locations not easily accessible without such assistance. Exposure Time: Please see nurses notes. Contrast: Before injecting any contrast, we confirmed that the patient did not have an allergy to iodine, shellfish, or radiological contrast. Once satisfactory needle placement was completed at the desired level, radiological contrast was injected. Contrast injected under live fluoroscopy. No contrast complications. See chart for type and volume of contrast used. Fluoroscopic Guidance: I was personally present during the use of fluoroscopy. "Tunnel Vision Technique" used to  obtain the best possible view of the target area. Parallax error corrected before commencing the procedure. "Direction-depth-direction" technique used to introduce the needle under continuous pulsed fluoroscopy. Once target was reached, antero-posterior, oblique, and lateral fluoroscopic projection used confirm needle placement in all planes. Images permanently stored in EMR. Interpretation: I personally interpreted the imaging intraoperatively. Adequate needle placement confirmed in multiple planes. Appropriate spread of contrast into desired area was observed. No evidence of afferent or efferent intravascular uptake. No intrathecal or subarachnoid spread observed. Permanent images saved into the patient's record.  Antibiotic Prophylaxis:   Anti-infectives (From admission, onward)   None     Indication(s): None identified  Post-operative Assessment:  Post-procedure Vital Signs:  Pulse/HCG Rate: 64  Temp: 97.9 F (36.6 C) Resp: 16 BP: 125/74 SpO2: 99 %  EBL: None  Complications: No immediate post-treatment complications observed by team, or reported by patient.  Note: The patient tolerated the entire procedure well. A repeat set of vitals were taken after the procedure and the patient was kept under observation following institutional policy, for this type of procedure. Post-procedural neurological assessment was performed, showing return to baseline, prior to discharge. The patient was provided with post-procedure discharge instructions, including a section on how to identify potential problems. Should any problems arise concerning this procedure, the patient was given instructions to immediately contact us, at any time, without hesitation. In any case, we plan to contact the patient by telephone for a follow-up status report regarding this interventional procedure.  Comments:  No additional relevant information.  Plan of Care  Orders:  Orders Placed This Encounter  Procedures  .  Lumbar Epidural Injection    Scheduling Instructions:     Procedure: Interlaminar LESI L4-5     Laterality: Right-sided     Sedation: Patient's choice     Timeframe:  Today    Order Specific Question:   Where will this procedure be performed?    Answer:   [facility]  . DG PAIN [facility] C-ARM 1-60 MIN NO REPORT    Intraoperative interpretation by procedural  physician at [facility].    Standing Status:   Standing    Number of Occurrences:   1    Order Specific Question:   Reason for exam:    Answer:   Assistance in needle guidance and placement for procedures requiring needle placement in or near specific anatomical locations not easily accessible without such assistance.  . Informed Consent Details: Physician/Practitioner Attestation; Transcribe to consent form and obtain patient signature    Provider Attestation: I, [place] Dossie Arbour, MD, (Pain Management Specialist), the physician/practitioner, attest that I have discussed with the patient the benefits, risks, side effects, alternatives, likelihood of achieving goals and potential problems during recovery for the procedure that I have provided informed consent.    Scheduling Instructions:     Procedure: Lumbar epidural steroid injection under fluoroscopic guidance     Indications: Low back and/or lower extremity pain secondary to lumbar radiculitis     Note: Always confirm laterality of pain with Wanda Martinez, before procedure.     Transcribe to consent form and obtain patient signature.  . Provide equipment / supplies at bedside    Equipment required: Single use, disposable, "Epidural Tray" Epidural Catheter: NOT required    Standing Status:   Standing    Number of Occurrences:   1    Order Specific Question:   Specify    Answer:   Epidural Tray   Chronic Opioid Analgesic:  Hydrocodone/APAP 5/325, 1 tab PO q 8 hrs (15 mg/day of hydrocodone) MME/day:15mg /day.   Medications ordered for procedure: Meds ordered  this encounter  Medications  . iohexol (OMNIPAQUE) 180 MG/ML injection 10 mL    Must be Myelogram-compatible. If not available, you may substitute with a water-soluble, non-ionic, hypoallergenic, myelogram-compatible radiological contrast medium.  Marland Kitchen lidocaine (XYLOCAINE) 2 % (with pres) injection 400 mg  . lactated ringers infusion 1,000 mL  . DISCONTD: midazolam (VERSED) 5 MG/5ML injection 1-2 mg    Make sure Flumazenil is available in the pyxis when using this medication. If oversedation occurs, administer 0.2 mg IV over 15 sec. If after 45 sec no response, administer 0.2 mg again over 1 min; may repeat at 1 min intervals; not to exceed 4 doses (1 mg)  . DISCONTD: fentaNYL (SUBLIMAZE) injection 25-50 mcg    Make sure Narcan is available in the pyxis when using this medication. In the event of respiratory depression (RR< 8/min): Titrate NARCAN (naloxone) in increments of 0.1 to 0.2 mg IV at 2-3 minute intervals, until desired degree of reversal.  . DISCONTD: sodium chloride flush (NS) 0.9 % injection 2 mL  . ropivacaine (PF) 2 mg/mL (0.2%) (NAROPIN) injection 2 mL  . triamcinolone acetonide (KENALOG-40) injection 40 mg   Medications administered: We administered iohexol, lidocaine, sodium chloride flush, ropivacaine (PF) 2 mg/mL (0.2%), and triamcinolone acetonide.  See the medical record for exact dosing, route, and time of administration.  Follow-up plan:   Return in about 2 weeks (around 06/03/2020) for (VV), (PP).       Interventional treatment options:  Under consideration: Diagnosticbilateral cervical facet block  Possiblebilateral cervical facet RFA  Palliative right-sided L5 TFESI #4 + right-sided L4-5 LESI #1 vs  Diagnostic right IA hip injection #2 vs Palliative right lumbar facet #7    Therapeutic/palliative (PRN): Diagnostic right CESI #2  Palliative bilateral trochanteric bursa injection #2  Diagnosticright IA hip joint injection #2  Palliative right lumbar facet  block #7  Palliative left lumbar facet block #4  Palliative right lumbar facet RFA #3(last  done06/18/19) Palliative left lumbar facet RFA #2 (last done 02/12/2018) Diagnostic/Therapeutic right SI block #2  Diagnostic/Therapeutic left SI block #2  Palliative right S-I RFA #4 (last done 06/05/2019) Palliative right suprascapular muscle TPI/MNB #3  Palliativeright L3-4 LESI#3 Palliative right L5 TFESI #4 Diagnostic right L5-S1 LESI #3     Recent Visits Date Type Provider Dept  03/24/20 Telemedicine Milinda Pointer, MD [facility]  03/18/20 Procedure visit Milinda Pointer, MD [facility]  Showing recent visits within past 90 days and meeting all other requirements   Today's Visits Date Type Provider Dept  05/20/20 Procedure visit Milinda Pointer, MD [facility]  Showing today's visits and meeting all other requirements   Future Appointments Date Type Provider Dept  06/10/20 Appointment Milinda Pointer, [facility]  06/23/20 Appointment Milinda Pointer, MD [facility]  Showing future appointments within next 90 days and meeting all other requirements   Disposition: Discharge home  Discharge (Date  Time): 05/20/2020; 0844 hrs.   Primary Care Physician: Einar Pheasant, MD Location: [facility] Note by: Gaspar Cola, MD Date: 05/20/2020; Time: 8:59 AM  Disclaimer:  Medicine is not an Chief Strategy Officer. The only guarantee in medicine is that nothing is guaranteed. It is important to note that the decision to proceed with this intervention was based on the information collected from the patient. The Data and conclusions were drawn from the patient's questionnaire, the interview, and the physical examination. Because the information was provided in large part by the patient, it cannot be guaranteed that it has not been purposely or unconsciously manipulated. Every effort has been made to  obtain as much relevant data as possible for this evaluation. It is important to note that the conclusions that lead to this procedure are derived in large part from the available data. Always take into account that the treatment will also be dependent on availability of resources and existing treatment guidelines, considered by other Pain Management Practitioners as being common knowledge and practice, at the time of the intervention. For Medico-Legal purposes, it is also important to point out that variation in procedural techniques and pharmacological choices are the acceptable norm. The indications, contraindications, technique, and results of the above procedure should only be interpreted and judged by a Board-Certified Interventional Pain Specialist with extensive familiarity and expertise in the same exact procedure and technique.

## 2020-05-21 ENCOUNTER — Telehealth: Payer: Self-pay | Admitting: *Deleted

## 2020-05-21 NOTE — Telephone Encounter (Signed)
Attempted to call for post procedure follow-up. Message left. 

## 2020-06-09 ENCOUNTER — Telehealth: Payer: Self-pay

## 2020-06-09 NOTE — Telephone Encounter (Signed)
Attempted to call patient for pre virtual appointment questions.  LM 

## 2020-06-10 ENCOUNTER — Encounter: Payer: Self-pay | Admitting: Pain Medicine

## 2020-06-10 ENCOUNTER — Ambulatory Visit: Payer: Medicare Other | Attending: Pain Medicine | Admitting: Pain Medicine

## 2020-06-10 ENCOUNTER — Other Ambulatory Visit: Payer: Self-pay

## 2020-06-10 DIAGNOSIS — G894 Chronic pain syndrome: Secondary | ICD-10-CM

## 2020-06-10 DIAGNOSIS — M545 Low back pain, unspecified: Secondary | ICD-10-CM

## 2020-06-10 DIAGNOSIS — M4807 Spinal stenosis, lumbosacral region: Secondary | ICD-10-CM | POA: Diagnosis not present

## 2020-06-10 DIAGNOSIS — M79604 Pain in right leg: Secondary | ICD-10-CM | POA: Diagnosis not present

## 2020-06-10 DIAGNOSIS — F1120 Opioid dependence, uncomplicated: Secondary | ICD-10-CM

## 2020-06-10 DIAGNOSIS — Z79899 Other long term (current) drug therapy: Secondary | ICD-10-CM

## 2020-06-10 DIAGNOSIS — G8929 Other chronic pain: Secondary | ICD-10-CM

## 2020-06-10 NOTE — Progress Notes (Signed)
Patient: Wanda Martinez  Service Category: E/M  Provider: Gaspar Cola, MD  DOB: 11/05/1935  DOS: 06/10/2020  Location: Office  MRN: [id number]  Setting: Ambulatory outpatient  Referring Provider: Einar Pheasant, MD  Type: Established Patient  Specialty: Interventional Pain Management  PCP: Einar Pheasant, MD  Location: Remote location  Delivery: TeleHealth     Virtual Encounter - Pain Management PROVIDER NOTE: Information contained herein reflects review and annotations entered in association with encounter. Interpretation of such information and data should be left to medically-trained personnel. Information provided to patient can be located elsewhere in the medical record under "Patient Instructions". Document created using STT-dictation technology, any transcriptional errors that may result from process are unintentional.    Contact & Pharmacy Preferred: [phone] Home: [phone] (home) Mobile: [phone] (mobile) E-mail: No e-mail address on record  [place], [place] - [address] [place] [address] Phone: [phone] Fax: [phone]   Pre-screening  Wanda Martinez offered "in-person" vs "virtual" encounter. She indicated preferring virtual for this encounter.   Reason COVID-19*   Social distancing based on CDC and AMA recommendations.   I contacted Wanda Martinez on 06/10/2020 via telephone.      I clearly identified myself as Gaspar Cola, MD. I verified that I was speaking with the correct person using two identifiers (Name: Wanda Martinez, and date of birth: 1935/10/23).  Consent I sought verbal advanced consent from Wanda Martinez for virtual visit interactions. I informed Wanda Martinez of possible security and privacy concerns, risks, and limitations associated with providing "not-in-person" medical evaluation and management services. I also informed Wanda Martinez of the availability of  "in-person" appointments. Finally, I informed her that there would be a charge for the virtual visit and that she could be  personally, fully or partially, financially responsible for it. Wanda Martinez expressed understanding and agreed to proceed.   Historic Elements   Wanda Martinez is a 85 y.o. year old, female patient evaluated today after her last contact with our practice on 06/09/2020. Wanda Martinez  has a past medical history of Acute postoperative pain (10/25/2017), Anemia, Arm pain (07/26/2015), Arthritis, Arthritis, degenerative (03/26/2014), Back pain (11/01/2013), Bladder infection (06/2018), Breast cancer ([place]), Breast cancer ([place]), CHEST PAIN (04/29/2010), Chronic cystitis, Cystocele (02/22/2013), Cystocele, midline (08/19/2013), Degeneration of intervertebral disc of lumbosacral region (03/26/2014), DYSPNEA (04/29/2010), Enthesopathy of hip (03/26/2014), GERD (gastroesophageal reflux disease), Hiatal hernia, HTN (hypertension), Hypokalemia (06/2018), Hyponatremia (06/2018), LBP (low back pain) (03/26/2014), Neck pain (11/01/2013), Parkinson disease ([place]), Pneumonia (06/2018), Sinusitis (02/07/2015), Skin lesions (07/12/2014), and Urinary incontinence. She also  has a past surgical history that includes Breast biopsy (2013); Tonsillectomy and adenoidectomy (79); Vesicovaginal fistula closure w/ TAH (1983); Breast surgery (1986); Breast enhancement surgery (1987); Breast implant removal; Breast implant removal (Right, 08/29/2012); Mastectomy (08/2012); Abdominal hysterectomy; and Colonoscopy with propofol (N/A, 09/13/2016). Wanda Martinez has a current medication list which includes the following prescription(s): amlodipine, aspirin, calcium carbonate, citalopram, cyclobenzaprine, donepezil, gabapentin, glucosamine sulfate, hydrochlorothiazide, hydrocodone-acetaminophen, letrozole, losartan, lubiprostone, magnesium oxide, meloxicam, metoprolol succinate, multivitamin, nystatin, omega-3, omeprazole, pantoprazole, and  pravastatin. She  reports that she has never smoked. She has never used smokeless tobacco. She reports that she does not drink alcohol and does not use drugs. Wanda Martinez is allergic to sulfa antibiotics and vesicare [solifenacin].   HPI  Today, she is being contacted for a post-procedure assessment.  According to the patient she did rather well after this right-sided L4-5 LESI.  She indicates that she was able to travel and tolerate the increased walking and sitting time and she enjoyed her time with her family.  She indicated being very happy and satisfied with the results of this procedure.  She continues to enjoy approximately 80% relief of her pain.  Post-Procedure Evaluation  Procedure: (05/20/2020) therapeutic right L4-5 LESI under fluoroscopic guidance, no sedation Pre-procedure pain level: 7/10 Post-procedure: 0/10 (100% relief)  Sedation: None.  Effectiveness during initial hour after procedure(Ultra-Short Term Relief): 100 %.  Local anesthetic used: Long-acting (4-6 hours) Effectiveness: Defined as any analgesic benefit obtained secondary to the administration of local anesthetics. This carries significant diagnostic value as to the etiological location, or anatomical origin, of the pain. Duration of benefit is expected to coincide with the duration of the local anesthetic used.  Effectiveness during initial 4-6 hours after procedure(Short-Term Relief): 100 %.  Long-term benefit: Defined as any relief past the pharmacologic duration of the local anesthetics.  Effectiveness past the initial 6 hours after procedure(Long-Term Relief): 80 %.  Current benefits: Defined as benefit that persist at this time.   Analgesia:  >75% relief Function: Wanda Martinez reports improvement in function ROM: Wanda Martinez reports improvement in ROM  Pharmacotherapy Assessment  Analgesic: Hydrocodone/APAP 5/325, 1 tab PO q 8 hrs (15 mg/day of hydrocodone) MME/day:'15mg'$ /day.   Monitoring: [place] PMP: PDMP  reviewed during this encounter.       Pharmacotherapy: No side-effects or adverse reactions reported. Compliance: No problems identified. Effectiveness: Clinically acceptable. Plan: Refer to "POC".  UDS:  Summary  Date Value Ref Range Status  01/08/2019 FINAL  Final    Comment:    ==================================================================== TOXASSURE SELECT 13 (MW) ==================================================================== Test                             Result       Flag       Units Drug Present and Declared for Prescription Verification   Hydrocodone                    1944         EXPECTED   ng/mg creat   Dihydrocodeine                 194          EXPECTED   ng/mg creat   Norhydrocodone                 2532         EXPECTED   ng/mg creat    Sources of hydrocodone include scheduled prescription    medications. Dihydrocodeine and norhydrocodone are expected    metabolites of hydrocodone. Dihydrocodeine is also available as a    scheduled prescription medication. ==================================================================== Test                      Result    Flag   Units      Ref Range   Creatinine              188              mg/dL      >=20 ==================================================================== Declared Medications:  The flagging and interpretation on this report are based on the  following declared medications.  Unexpected results may arise from  inaccuracies in the declared medications.  **Note: The testing scope of this panel includes these medications:  Hydrocodone (Hydrocodone-Acetaminophen)  **Note:  The testing scope of this panel does not include following  reported medications:  Acetaminophen (Hydrocodone-Acetaminophen)  Acyclovir  Amlodipine Besylate  Aspirin  Calcium carbonate  Citalopram  Cyclobenzaprine  Gabapentin  Glucosamine  Hydrochlorothiazide  Letrozole  Losartan (Losartan Potassium)  Lubiprostone   Magnesium Oxide  Meloxicam  Metoprolol  Multivitamin  Omega-3 Fatty Acids  Omeprazole  Pantoprazole  Pravastatin ==================================================================== For clinical consultation, please call [phone]. ====================================================================     Laboratory Chemistry Profile   Renal Lab Results  Component Value Date   BUN 26 (H) 01/27/2020   CREATININE 1.02 (H) 01/27/2020   BCR 12 01/08/2019   GFR 44.26 (L) 01/05/2020   GFRAA 59 (L) 01/27/2020   GFRNONAA 51 (L) 01/27/2020     Hepatic Lab Results  Component Value Date   AST 28 10/17/2019   ALT 20 10/17/2019   ALBUMIN 4.4 10/17/2019   ALKPHOS 50 10/17/2019     Electrolytes Lab Results  Component Value Date   NA 135 01/27/2020   K 4.9 01/27/2020   CL 101 01/27/2020   CALCIUM 9.2 01/27/2020   MG 2.2 07/17/2018     Bone Lab Results  Component Value Date   25OHVITD1 42 07/17/2018   25OHVITD2 <1.0 07/17/2018   25OHVITD3 42 07/17/2018     Inflammation (CRP: Acute Phase) (ESR: Chronic Phase) Lab Results  Component Value Date   CRP 3 07/17/2018   ESRSEDRATE 27 07/17/2018       Note: Above Lab results reviewed.   Imaging  DG PAIN CLINIC C-ARM 1-60 MIN NO REPORT Fluoro was used, but no Radiologist interpretation will be provided.  Please refer to "NOTES" tab for provider progress note.  Assessment  The primary encounter diagnosis was Chronic pain syndrome. 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  Plan of Care  Problem-specific:  No problem-specific Assessment & Plan notes found for this encounter.  Wanda Martinez has a current medication list which includes the following  long-term medication(s): amlodipine, calcium carbonate, cyclobenzaprine, donepezil, gabapentin, hydrochlorothiazide, hydrocodone-acetaminophen, losartan, magnesium oxide, meloxicam, metoprolol succinate, omeprazole, pantoprazole, and pravastatin.  Pharmacotherapy (Medications Ordered): No orders of the defined types were placed in this encounter.  Orders:  Orders Placed This Encounter  Procedures   ToxASSURE Select 13 (MW), Urine    Volume: 30 ml(s). Minimum 3 ml of urine is needed. Document temperature of fresh sample. Indications: Long term (current) use of opiate analgesic (T01.601)    Order Specific Question:   Release to patient    Answer:   Immediate   Follow-up plan:   Return in about 5 weeks (around 07/14/2020) for (F2F), (MM).      Interventional treatment options:  Under consideration: Diagnosticbilateral cervical facet block  Possiblebilateral cervical facet RFA    Therapeutic/palliative (PRN): Diagnostic right CESI #2  Palliative bilateral trochanteric bursa injection #2  Diagnosticright IA hip joint injection #2  Palliative right lumbar facet block #7  Palliative left lumbar facet block #4  Palliative right lumbar facet RFA #3(last done06/18/19) Palliative left lumbar facet RFA #2 (last done 02/12/2018) Diagnostic/Therapeutic right SI block #2  Diagnostic/Therapeutic left SI block #2  Palliative right S-I RFA #4 (last done 06/05/2019) Palliative right suprascapular muscle TPI/MNB #3  Palliativeright L3-4 LESI#3 Palliative right L5 TFESI #4 Diagnostic right L5-S1 LESI #3  Diagnostic/therapeutic right  L4-5 LESI #2 (100/100/80/80)    Recent Visits Date Type Provider Dept  05/20/20 Procedure visit Milinda Pointer, MD [facility]  03/24/20 Telemedicine Milinda Pointer, MD [facility]  03/18/20 Procedure visit Milinda Pointer, MD [facility]  Showing recent visits within past 90 days and meeting all other  requirements Today's Visits Date Type Provider Dept  06/10/20 Telemedicine Milinda Pointer, MD [facility]  Showing today's visits and meeting all other requirements Future Appointments Date Type Provider Dept  06/23/20 Appointment Milinda Pointer, MD [facility]  Showing future appointments within next 90 days and meeting all other requirements  I discussed the assessment and treatment plan with the patient. The patient was provided an opportunity to ask questions and all were answered. The patient agreed with the plan and demonstrated an understanding of the instructions.  Patient advised to call back or seek an in-person evaluation if the symptoms or condition worsens.  Duration of encounter: 15 minutes.  Note by: Gaspar Cola, MD Date: 06/10/2020; Time: 12:04 PM

## 2020-06-11 ENCOUNTER — Other Ambulatory Visit: Payer: Self-pay

## 2020-06-11 ENCOUNTER — Encounter: Payer: Self-pay | Admitting: Internal Medicine

## 2020-06-11 ENCOUNTER — Other Ambulatory Visit (HOSPITAL_COMMUNITY)
Admission: RE | Admit: 2020-06-11 | Discharge: 2020-06-11 | Disposition: A | Discharge status: Home / Self Care | Payer: Medicare Other | Source: Ambulatory Visit | Attending: Internal Medicine | Admitting: Internal Medicine

## 2020-06-11 ENCOUNTER — Ambulatory Visit (INDEPENDENT_AMBULATORY_CARE_PROVIDER_SITE_OTHER): Payer: Medicare Other | Admitting: Internal Medicine

## 2020-06-11 DIAGNOSIS — N760 Acute vaginitis: Secondary | ICD-10-CM | POA: Insufficient documentation

## 2020-06-11 DIAGNOSIS — R300 Dysuria: Secondary | ICD-10-CM

## 2020-06-11 DIAGNOSIS — R000 Tachycardia, unspecified: Secondary | ICD-10-CM

## 2020-06-11 DIAGNOSIS — E7800 Pure hypercholesterolemia, unspecified: Secondary | ICD-10-CM | POA: Diagnosis not present

## 2020-06-11 DIAGNOSIS — F32A Depression, unspecified: Secondary | ICD-10-CM

## 2020-06-11 DIAGNOSIS — R739 Hyperglycemia, unspecified: Secondary | ICD-10-CM | POA: Insufficient documentation

## 2020-06-11 DIAGNOSIS — K219 Gastro-esophageal reflux disease without esophagitis: Secondary | ICD-10-CM

## 2020-06-11 DIAGNOSIS — I10 Essential (primary) hypertension: Secondary | ICD-10-CM

## 2020-06-11 DIAGNOSIS — N1831 Chronic kidney disease, stage 3a: Secondary | ICD-10-CM

## 2020-06-11 DIAGNOSIS — C50211 Malignant neoplasm of upper-inner quadrant of right female breast: Secondary | ICD-10-CM

## 2020-06-11 DIAGNOSIS — F320 Major depressive disorder, single episode, mild: Secondary | ICD-10-CM

## 2020-06-11 DIAGNOSIS — R0602 Shortness of breath: Secondary | ICD-10-CM

## 2020-06-11 DIAGNOSIS — G20 Parkinson's disease: Secondary | ICD-10-CM

## 2020-06-11 DIAGNOSIS — D649 Anemia, unspecified: Secondary | ICD-10-CM | POA: Diagnosis not present

## 2020-06-11 LAB — POCT URINALYSIS DIPSTICK
Bilirubin, UA: NEGATIVE
Blood, UA: NEGATIVE
Glucose, UA: NEGATIVE
Ketones, UA: NEGATIVE
Leukocytes, UA: NEGATIVE
Nitrite, UA: NEGATIVE
Protein, UA: NEGATIVE
Spec Grav, UA: 1.015 (ref 1.010–1.025)
Urobilinogen, UA: 0.2 E.U./dL
pH, UA: 5.5 (ref 5.0–8.0)

## 2020-06-11 NOTE — Progress Notes (Signed)
Patient ID: Wanda Martinez, female   DOB: 1935/09/05, 85 y.o.   MRN: [id number]   Subjective:    Patient ID: Wanda Martinez, female    DOB: [birth year], 85 y.o.   MRN: [id number]  HPI This visit occurred during the SARS-CoV-2 public health emergency.  Safety protocols were in place, including screening questions prior to the visit, additional usage of staff PPE, and extensive cleaning of exam room while observing appropriate contact time as indicated for disinfecting solutions.  Patient with past history of hypercholesterolemia, parkinsons and breast cancer. She comes in today to follow up on these issues as well as for a complete physical exam.  Went to [place] recently.  UTI while out of town.  Treated with macrobid.  Symptoms improved, but still feels some burning and irritation.  Still some urgency and has noticed minimal vaginal discharge. No chest pain.  Breathing stable.  When in [place] had one episode of heart racing.  No further episodes since returning.  Some occasional sob.  No significant change in her breathing.  Not sleeping as well.  Discussed melatonin.  No headache.  Does report some decreased hearing - left ear.  No pain.  No increased sinus congestion.  Using flonase.    Past Medical History:  Diagnosis Date  . Acute postoperative pain 10/25/2017  . Anemia   . Arm pain 07/26/2015  . Arthritis   . Arthritis, degenerative 03/26/2014  . Back pain 11/01/2013  . Bladder infection 06/2018  . Breast cancer ([place])    Masectomy - left - 1986   . Breast cancer [facility])    Mastectomy-right -2014  . CHEST PAIN 04/29/2010   Qualifier: Diagnosis of  By: Wynetta Emery RN, Doroteo Bradford    . Chronic cystitis   . Cystocele 02/22/2013  . Cystocele, midline 08/19/2013  . Degeneration of intervertebral disc of lumbosacral region 03/26/2014  . DYSPNEA 04/29/2010   Qualifier: Diagnosis of  By: Wynetta Emery RN, Doroteo Bradford    . Enthesopathy of hip 03/26/2014  . GERD (gastroesophageal reflux disease)   . Hiatal hernia   . HTN  (hypertension)   . Hypokalemia 06/2018  . Hyponatremia 06/2018  . LBP (low back pain) 03/26/2014  . Neck pain 11/01/2013  . Parkinson disease ([place])   . Pneumonia 06/2018  . Sinusitis 02/07/2015  . Skin lesions 07/12/2014  . Urinary incontinence    mixed    Past Surgical History:  Procedure Laterality Date  . ABDOMINAL HYSTERECTOMY    . BREAST BIOPSY  2013  . BREAST ENHANCEMENT SURGERY  1987  . BREAST IMPLANT REMOVAL    . BREAST IMPLANT REMOVAL Right 08/29/2012  . BREAST SURGERY  1986   s/p left mastectomy  . COLONOSCOPY WITH PROPOFOL N/A 09/13/2016   Procedure: COLONOSCOPY WITH PROPOFOL;  Surgeon: Manya Silvas, MD;  Location[facility];  Service: Endoscopy;  Laterality: N/A;  . MASTECTOMY  08/2012   right  . TONSILLECTOMY AND ADENOIDECTOMY  79  . VESICOVAGINAL FISTULA CLOSURE W/ TAH  1983   Family History  Problem Relation Age of Onset  . Diabetes Father   . Stroke Father   . Colon polyps Father   . Stroke Mother   . Parkinson's disease Mother    Social History   Socioeconomic History  . Marital status: Married    Spouse name: Not on file  . Number of children: 3  . Years of education: Not on file  . Highest education level: Not on file  Occupational History  . Not  on file  Tobacco Use  . Smoking status: Never Smoker  . Smokeless tobacco: Never Used  . Tobacco comment: tobacco use - no  Vaping Use  . Vaping Use: Never used  Substance and Sexual Activity  . Alcohol use: No    Alcohol/week: 0.0 standard drinks  . Drug use: No  . Sexual activity: Never  Other Topics Concern  . Not on file  Social History Narrative  . Not on file   Social Determinants of Health   Financial Resource Strain: Low Risk   . Difficulty of Paying Living Expenses: Not hard at all  Food Insecurity: No Food Insecurity  . Worried About Charity fundraiser in the Last Year: Never true  . Ran Out of Food in the Last Year: Never true  Transportation Needs: No Transportation Needs    . Lack of Transportation (Medical): No  . Lack of Transportation (Non-Medical): No  Physical Activity:   . Days of Exercise per Week:   . Minutes of Exercise per Session:   Stress: No Stress Concern Present  . Feeling of Stress : Not at all  Social Connections:   . Frequency of Communication with Friends and Family:   . Frequency of Social Gatherings with Friends and Family:   . Attends Religious Services:   . Active Member of Clubs or Organizations:   . Attends Archivist Meetings:   Marland Kitchen Marital Status:     Outpatient Encounter Medications as of 06/11/2020  Medication Sig  . amLODipine (NORVASC) 2.5 MG tablet TAKE 1 TABLET BY MOUTH ONCE DAILY.  Marland Kitchen aspirin 81 MG EC tablet Take 81 mg by mouth daily as needed.    . Calcium Carbonate (CALCIUM 600) 1500 MG TABS Take 1 tablet by mouth daily.    . citalopram (CELEXA) 20 MG tablet TAKE 1 & 1/2 TABLETS BY MOUTH ONCE DAILY  . cyclobenzaprine (FLEXERIL) 10 MG tablet TAKE ONE TABLET BY MOUTH AT BEDTIME.  Marland Kitchen donepezil (ARICEPT) 5 MG tablet   . gabapentin (NEURONTIN) 100 MG capsule Take 200 mg by mouth 3 (three) times daily.   Marland Kitchen GLUCOSAMINE SULFATE PO Take by mouth daily.   . hydrochlorothiazide (HYDRODIURIL) 25 MG tablet TAKE 1 TABLET BY MOUTH ONCE DAILY. (PATIENT TAKES 1/2 ONCE DAILY AND EXTRA 1/2 IF NEEDED)  . HYDROcodone-acetaminophen (NORCO/VICODIN) 5-325 MG tablet Take 1 tablet by mouth every 8 (eight) hours as needed for severe pain. Must last 30 days  . letrozole (FEMARA) 2.5 MG tablet Take 2.5 mg by mouth daily.  Marland Kitchen losartan (COZAAR) 50 MG tablet TAKE (2) TABLETS BY MOUTH ONCE DAILY.  Marland Kitchen lubiprostone (AMITIZA) 24 MCG capsule TAKE 1 CAPSULE BY MOUTH ONCE DAILY WITH BREAKFAST.  . magnesium oxide (MAG-OX) 400 (241.3 Mg) MG tablet TAKE 1 TABLET BY MOUTH ONCE DAILY.  . meloxicam (MOBIC) 7.5 MG tablet TAKE 1 TABLET BY MOUTH ONCE DAILY.  . metoprolol succinate (TOPROL-XL) 25 MG 24 hr tablet TAKE 1 TABLET BY MOUTH TWICE DAILY  . Multiple  Vitamin (MULTIVITAMIN) tablet Take 1 tablet by mouth daily.  Marland Kitchen nystatin (MYCOSTATIN) 100000 UNIT/ML suspension Swish and spit tid  . Omega-3 1000 MG CAPS Take by mouth daily.   Marland Kitchen omeprazole (PRILOSEC) 20 MG capsule TAKE 1 CAPSULE BY MOUTH TWICE DAILY FOR REFLUX.  . pantoprazole (PROTONIX) 40 MG tablet TAKE 1 TABLET BY MOUTH ONCE DAILY.  . pravastatin (PRAVACHOL) 20 MG tablet TAKE 1 TABLET BY MOUTH ONCE DAILY.   No facility-administered encounter medications on file as of  06/11/2020.    Review of Systems  Constitutional: Negative for appetite change and unexpected weight change.  HENT: Negative for congestion and sinus pressure.   Eyes: Negative for pain and visual disturbance.  Respiratory: Negative for cough and chest tightness.        Some previous sob as outlined.    Cardiovascular: Negative for chest pain, palpitations and leg swelling.       Increased heart rate as outlined.    Gastrointestinal: Negative for abdominal pain, diarrhea, nausea and vomiting.  Genitourinary: Positive for urgency. Negative for difficulty urinating and dysuria.       Burning and irritation.   Musculoskeletal: Positive for back pain. Negative for joint swelling and myalgias.  Skin: Negative for color change and rash.  Neurological: Negative for dizziness, light-headedness and headaches.  Hematological: Negative for adenopathy. Does not bruise/bleed easily.  Psychiatric/Behavioral: Negative for agitation and dysphoric mood.       Objective:    Physical Exam Vitals reviewed.  Constitutional:      General: She is not in acute distress.    Appearance: Normal appearance.  HENT:     Head: Normocephalic and atraumatic.     Right Ear: External ear normal.     Left Ear: External ear normal.  Eyes:     General: No scleral icterus.       Right eye: No discharge.        Left eye: No discharge.     Conjunctiva/sclera: Conjunctivae normal.  Neck:     Thyroid: No thyromegaly.  Cardiovascular:     Rate  and Rhythm: Normal rate and regular rhythm.  Pulmonary:     Effort: No respiratory distress.     Breath sounds: Normal breath sounds. No wheezing.     Comments: S/p bilateral mastectomy. No palpable adenopathy.   Abdominal:     General: Bowel sounds are normal.     Palpations: Abdomen is soft.     Tenderness: There is no abdominal tenderness.  Genitourinary:    Comments: Normal external genitalia.  Vaginal vault without lesions.  KOH/wet prep obtained.  Could not appreciate any adnexal masses or tenderness.   Musculoskeletal:        General: No swelling or tenderness.     Cervical back: Neck supple. No tenderness.  Lymphadenopathy:     Cervical: No cervical adenopathy.  Skin:    Findings: No erythema or rash.  Neurological:     Mental Status: She is alert.  Psychiatric:        Mood and Affect: Mood normal.        Behavior: Behavior normal.     BP 130/62   Pulse 72   Temp 98 F (36.7 C)   Resp 16   Ht 5' 4" (1.626 m)   Wt 188 lb (85.3 kg)   LMP 12/18/1981   SpO2 99%   BMI 32.27 kg/m  Wt Readings from Last 3 Encounters:  06/11/20 188 lb (85.3 kg)  05/20/20 179 lb (81.2 kg)  03/18/20 185 lb (83.9 kg)     Lab Results  Component Value Date   WBC 6.6 06/11/2020   HGB 11.3 (L) 06/11/2020   HCT 34.5 (L) 06/11/2020   PLT 213 06/11/2020   GLUCOSE 87 06/11/2020   CHOL 170 06/11/2020   TRIG 175 (H) 06/11/2020   HDL 42 (L) 06/11/2020   LDLDIRECT 73.0 07/09/2018   LDLCALC 101 (H) 06/11/2020   ALT 19 06/11/2020   AST 20 06/11/2020   NA 137  06/11/2020   K 3.5 06/11/2020   CL 101 06/11/2020   CREATININE 0.99 (H) 06/11/2020   BUN 20 06/11/2020   CO2 27 06/11/2020   TSH 1.63 06/11/2020   HGBA1C 5.5 06/11/2020    DG PAIN CLINIC C-ARM 1-60 MIN NO REPORT  Result Date: 05/20/2020 Fluoro was used, but no Radiologist interpretation will be provided. Please refer to "NOTES" tab for provider progress note.      Assessment & Plan:   Problem List Items Addressed This  Visit    Anemia    Follow cbc.        Relevant Orders   CBC with Differential/Platelet (Completed)   Ferritin (Completed)   Iron and TIBC   CKD (chronic kidney disease) stage 3, GFR 30-59 ml/min (HCC)    Stay hydrated.  Follow met b.       Dysuria    Recently treated for uti.  Persistent symptoms.  Check urine to confirm if persistent infection.  Hold abx.  Await culture results.        Relevant Orders   POCT urinalysis dipstick (Completed)   Urine Culture   Urine Microscopic (Completed)   Essential hypertension    Blood pressure as outlined.  Continue hctz, metoprolol, losartan and amlodipine.  Follow pressures.  Follow metabolic panel.       Relevant Orders   TSH (Completed)   Basic metabolic panel (Completed)   GERD (gastroesophageal reflux disease)    On protonix.  Stable.       Hypercholesterolemia    On pravastatin.  Low cholesterol diet and exercise.  Follow lipid panel and liver function tests.        Relevant Orders   Hepatic function panel (Completed)   Lipid panel (Completed)   Hyperglycemia    Low carb diet and exercise.  Follow met b and a1c.        Relevant Orders   Hemoglobin A1c (Completed)   Increased heart rate    Increased heart rate as outlined.  Previously saw cardiology.  Was planning for further cardiac w/up.  Refer back to cardiology to complete w/up.       Relevant Orders   Ambulatory referral to Cardiology   Mild depression ([place])    On citalopram.  Stable.        Parkinsons ([place])    Followed by neurology.  Stable.        Primary cancer of upper inner quadrant of right female breast ([place])    Followed by oncology.  On femara.       Pure hypercholesterolemia    On pravastatin.  Low cholesterol diet and exercise.  Follow lipid panel and liver function tests.        SOB (shortness of breath)    Some sob with exertion.  Previously saw cardiology.  Was planning for stress test and holter.  Request referral back to cardiology.  Had  episode of increased heart rate as outlined.  Referral to cardiology for further w/up.  Wanted to hold on EKG.       Relevant Orders   Ambulatory referral to Cardiology   Vaginitis    Vaginal irritation.  KOH/wet prep obtained.  Hold on treatment until resulted.  Follow.        Relevant Orders   Cervicovaginal ancillary only( [place])       Einar Pheasant, MD

## 2020-06-12 ENCOUNTER — Encounter: Payer: Self-pay | Admitting: Internal Medicine

## 2020-06-12 DIAGNOSIS — R000 Tachycardia, unspecified: Secondary | ICD-10-CM | POA: Insufficient documentation

## 2020-06-12 LAB — CBC WITH DIFFERENTIAL/PLATELET
Absolute Monocytes: 449 cells/uL (ref 200–950)
Basophils Absolute: 73 cells/uL (ref 0–200)
Basophils Relative: 1.1 %
Eosinophils Absolute: 178 cells/uL (ref 15–500)
Eosinophils Relative: 2.7 %
HCT: 34.5 % — ABNORMAL LOW (ref 35.0–45.0)
Hemoglobin: 11.3 g/dL — ABNORMAL LOW (ref 11.7–15.5)
Lymphs Abs: 1564 cells/uL (ref 850–3900)
MCH: 29.5 pg (ref 27.0–33.0)
MCHC: 32.8 g/dL (ref 32.0–36.0)
MCV: 90.1 fL (ref 80.0–100.0)
MPV: 11.4 fL (ref 7.5–12.5)
Monocytes Relative: 6.8 %
Neutro Abs: 4336 cells/uL (ref 1500–7800)
Neutrophils Relative %: 65.7 %
Platelets: 213 10*3/uL (ref 140–400)
RBC: 3.83 10*6/uL (ref 3.80–5.10)
RDW: 13.4 % (ref 11.0–15.0)
Total Lymphocyte: 23.7 %
WBC: 6.6 10*3/uL (ref 3.8–10.8)

## 2020-06-12 LAB — URINE CULTURE
MICRO NUMBER:: 10635615
SPECIMEN QUALITY:: ADEQUATE

## 2020-06-12 LAB — HEPATIC FUNCTION PANEL
AG Ratio: 1.5 (calc) (ref 1.0–2.5)
ALT: 19 U/L (ref 6–29)
AST: 20 U/L (ref 10–35)
Albumin: 4.1 g/dL (ref 3.6–5.1)
Alkaline phosphatase (APISO): 37 U/L (ref 37–153)
Bilirubin, Direct: 0 mg/dL (ref 0.0–0.2)
Globulin: 2.7 g/dL (calc) (ref 1.9–3.7)
Indirect Bilirubin: 0.4 mg/dL (calc) (ref 0.2–1.2)
Total Bilirubin: 0.4 mg/dL (ref 0.2–1.2)
Total Protein: 6.8 g/dL (ref 6.1–8.1)

## 2020-06-12 LAB — URINALYSIS, MICROSCOPIC ONLY
Bacteria, UA: NONE SEEN /HPF
Hyaline Cast: NONE SEEN /LPF
RBC / HPF: NONE SEEN /HPF (ref 0–2)
Squamous Epithelial / HPF: NONE SEEN /HPF (ref <=5)
WBC, UA: NONE SEEN /HPF (ref 0–5)

## 2020-06-12 LAB — FERRITIN: Ferritin: 46 ng/mL (ref 16–288)

## 2020-06-12 LAB — LIPID PANEL
Cholesterol: 170 mg/dL (ref <200)
HDL: 42 mg/dL — ABNORMAL LOW (ref >=50)
LDL Cholesterol (Calc): 101 mg/dL (calc) — ABNORMAL HIGH
Non-HDL Cholesterol (Calc): 128 mg/dL (calc) (ref <130)
Total CHOL/HDL Ratio: 4 (calc) (ref <5.0)
Triglycerides: 175 mg/dL — ABNORMAL HIGH (ref <150)

## 2020-06-12 LAB — BASIC METABOLIC PANEL
BUN/Creatinine Ratio: 20 (calc) (ref 6–22)
BUN: 20 mg/dL (ref 7–25)
CO2: 27 mmol/L (ref 20–32)
Calcium: 9 mg/dL (ref 8.6–10.4)
Chloride: 101 mmol/L (ref 98–110)
Creat: 0.99 mg/dL — ABNORMAL HIGH (ref 0.60–0.88)
Glucose, Bld: 87 mg/dL (ref 65–99)
Potassium: 3.5 mmol/L (ref 3.5–5.3)
Sodium: 137 mmol/L (ref 135–146)

## 2020-06-12 LAB — HEMOGLOBIN A1C
Hgb A1c MFr Bld: 5.5 % of total Hgb (ref <5.7)
Mean Plasma Glucose: 111 (calc)
eAG (mmol/L): 6.2 (calc)

## 2020-06-12 LAB — IRON, TOTAL/TOTAL IRON BINDING CAP
%SAT: 28 % (calc) (ref 16–45)
Iron: 87 ug/dL (ref 45–160)
TIBC: 309 mcg/dL (calc) (ref 250–450)

## 2020-06-12 LAB — TSH: TSH: 1.63 mIU/L (ref 0.40–4.50)

## 2020-06-12 NOTE — Assessment & Plan Note (Signed)
On pravastatin.  Low cholesterol diet and exercise.  Follow lipid panel and liver function tests.   

## 2020-06-12 NOTE — Assessment & Plan Note (Signed)
Some sob with exertion.  Previously saw cardiology.  Was planning for stress test and holter.  Request referral back to cardiology.  Had episode of increased heart rate as outlined.  Referral to cardiology for further w/up.  Wanted to hold on EKG.

## 2020-06-12 NOTE — Assessment & Plan Note (Signed)
Followed by oncology.  On femara.   

## 2020-06-12 NOTE — Assessment & Plan Note (Signed)
On protonix.  Stable.

## 2020-06-12 NOTE — Assessment & Plan Note (Signed)
Vaginal irritation.  KOH/wet prep obtained.  Hold on treatment until resulted.  Follow.

## 2020-06-12 NOTE — Assessment & Plan Note (Signed)
On citalopram.  Stable.   

## 2020-06-12 NOTE — Assessment & Plan Note (Signed)
Recently treated for uti.  Persistent symptoms.  Check urine to confirm if persistent infection.  Hold abx.  Await culture results.

## 2020-06-12 NOTE — Assessment & Plan Note (Signed)
Increased heart rate as outlined.  Previously saw cardiology.  Was planning for further cardiac w/up.  Refer back to cardiology to complete w/up.

## 2020-06-12 NOTE — Assessment & Plan Note (Signed)
Stay hydrated.  Follow met b.

## 2020-06-12 NOTE — Assessment & Plan Note (Signed)
Blood pressure as outlined.  Continue hctz, metoprolol, losartan and amlodipine.  Follow pressures.  Follow metabolic panel.

## 2020-06-12 NOTE — Assessment & Plan Note (Signed)
Followed by neurology.  Stable  

## 2020-06-12 NOTE — Assessment & Plan Note (Signed)
Low carb diet and exercise.  Follow met b and a1c.   

## 2020-06-12 NOTE — Assessment & Plan Note (Signed)
Follow cbc.  

## 2020-06-15 LAB — CERVICOVAGINAL ANCILLARY ONLY
Bacterial Vaginitis (gardnerella): NEGATIVE
Candida Glabrata: NEGATIVE
Candida Vaginitis: NEGATIVE
Comment: NEGATIVE
Comment: NEGATIVE
Comment: NEGATIVE

## 2020-06-23 ENCOUNTER — Encounter: Payer: Medicare Other | Admitting: Pain Medicine

## 2020-07-01 ENCOUNTER — Ambulatory Visit
Admission: RE | Admit: 2020-07-01 | Discharge: 2020-07-01 | Disposition: A | Discharge status: Home / Self Care | Payer: Medicare Other | Source: Ambulatory Visit | Attending: Oncology | Admitting: Oncology

## 2020-07-01 DIAGNOSIS — Z780 Asymptomatic menopausal state: Secondary | ICD-10-CM | POA: Diagnosis not present

## 2020-07-01 DIAGNOSIS — C50211 Malignant neoplasm of upper-inner quadrant of right female breast: Secondary | ICD-10-CM | POA: Diagnosis not present

## 2020-07-01 DIAGNOSIS — M818 Other osteoporosis without current pathological fracture: Secondary | ICD-10-CM

## 2020-07-01 DIAGNOSIS — R29890 Loss of height: Secondary | ICD-10-CM | POA: Diagnosis not present

## 2020-07-01 DIAGNOSIS — M85852 Other specified disorders of bone density and structure, left thigh: Secondary | ICD-10-CM | POA: Diagnosis not present

## 2020-07-05 DIAGNOSIS — R399 Unspecified symptoms and signs involving the genitourinary system: Secondary | ICD-10-CM | POA: Diagnosis not present

## 2020-07-05 DIAGNOSIS — N390 Urinary tract infection, site not specified: Secondary | ICD-10-CM | POA: Diagnosis not present

## 2020-07-06 NOTE — Progress Notes (Signed)
PROVIDER NOTE: Information contained herein reflects review and annotations entered in association with encounter. Interpretation of such information and data should be left to medically-trained personnel. Information provided to patient can be located elsewhere in the medical record under "Patient Instructions". Document created using STT-dictation technology, any transcriptional errors that may result from process are unintentional.    Patient: Wanda Martinez  Service Category: E/M  Provider: Gaspar Cola, MD  DOB: 24-Aug-1935  DOS: 07/07/2020  Specialty: Interventional Pain Management  MRN: [id number]  Setting: Ambulatory outpatient  PCP: Einar Pheasant, MD  Type: Established Patient    Referring Provider: Einar Pheasant, MD  Location: Office  Delivery: Face-to-face     HPI  Reason for encounter: Wanda Martinez, a 85 y.o. year old female, is here today for evaluation and management of her Chronic pain syndrome [G89.4]. Wanda Martinez primary complain today is Back Pain (lower) Last encounter: Practice (06/09/2020). My last encounter with her was on 05/20/2020. Pertinent problems: Wanda Martinez has Primary cancer of upper inner quadrant of right female breast ([place]); Headache; Parkinson's disease ([place]); Chronic low back pain (Primary Area of Pain) (Bilateral) (R>L); Lumbar spondylosis; Chronic hip pain (Bilateral); Chronic neck pain; Cervical spondylosis; Chronic cervical radicular pain (Right); Diffuse myofascial pain syndrome; Neurogenic pain; Chronic upper back pain (Right); Myofascial pain syndrome (Right) (cervicothoracic); Lumbar facet syndrome (Bilateral) (R>L); Malignant neoplasm of right female breast ([place]); Cervical facet hypertrophy; Cervical facet syndrome ([place]); Chronic shoulder pain (Right); Chronic pain syndrome; Chronic sacroiliac joint pain (Left); Chronic sacroiliac joint pain (Right); 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, L4-5, L5-S1) (Right); Lumbar spinal stenosis (with  neurogenic claudication) (L3-4); Chronic lower extremity pain (Secondary Area of Pain) (Right); Chronic lumbar radicular pain (S1) (Right); Trochanteric bursitis of hip (Bilateral); Spondylosis without myelopathy or radiculopathy, lumbosacral region; Trigger point with back pain (Right); DDD (degenerative disc disease), lumbosacral; Chronic upper extremity pain (Right); Chronic thoracic back pain (Bilateral) (L>R); Trigger point of thoracic region (Bilateral) (L>R); Other specified dorsopathies, sacral and sacrococcygeal region; Sacroiliac joint dysfunction (Right); Osteoarthritis of sacroiliac joint (Right); Somatic dysfunction of sacroiliac joint (Right); Chronic musculoskeletal pain; Facial pain; Chronic hip pain (Right); Osteoarthritis of hip (Right); Left ear pain; 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 Pain Assessment: Severity of Chronic pain is reported as a 5 /10. Location: Back Lower, Right/right leg down to toes. Onset: More than a month ago. Quality:  . Timing: Constant. Modifying factor(s): sitting, lying, medication. Vitals:  height is _0  (1.626 m) and weight is 183 lb (83 kg). Her temporal temperature is 97.3 F (36.3 C) (abnormal). Her blood pressure is 136/76 and her pulse is 60. Her respiration is 17 and oxygen saturation is 99%.    The patient indicates doing well with the current medication regimen. No adverse reactions or side effects reported to the medications.  She seems to be having more difficulty sleeping at night and she has requested that we give her a refill on her gabapentin and trazodone.  However, she is on Celexa and I do not want to prescribe another antidepressant, so that we can avoid a serotonin syndrome.  I have offered to increase her Neurontin at bedtime and hopefully this will help her sleep better  and controlled the pain.  Pharmacotherapy Assessment   Analgesic: Hydrocodone/APAP 5/325, 1 tab PO q 8 hrs (15 mg/day of hydrocodone) MME/day:67m/day.   Monitoring: [place] PMP: PDMP reviewed  during this encounter.       Pharmacotherapy: No side-effects or adverse reactions reported. Compliance: No problems identified. Effectiveness: Clinically acceptable.  [name], RN  07/07/2020  1:53 PM  Sign when Signing Visit Nursing Pain Medication Assessment:  Safety precautions to be maintained throughout the outpatient stay will include: orient to surroundings, keep bed in low position, maintain call bell within reach at all times, provide assistance with transfer out of bed and ambulation.  Medication Inspection Compliance: Wanda Martinez did not comply with our request to bring her pills to be counted. She was reminded that bringing the medication bottles, even when empty, is a requirement.  Medication: None brought in. Pill/Patch Count: None available to be counted. Bottle Appearance: No container available. Did not bring bottle(s) to appointment. Filled Date: N/A Last Medication intake:  Yesterday    UDS:  Summary  Date Value Ref Range Status  01/08/2019 FINAL  Final    Comment:    ==================================================================== TOXASSURE SELECT 13 (MW) ==================================================================== Test                             Result       Flag       Units Drug Present and Declared for Prescription Verification   Hydrocodone                    1944         EXPECTED   ng/mg creat   Dihydrocodeine                 194          EXPECTED   ng/mg creat   Norhydrocodone                 2532         EXPECTED   ng/mg creat    Sources of hydrocodone include scheduled prescription    medications. Dihydrocodeine and norhydrocodone are expected    metabolites of hydrocodone. Dihydrocodeine is also available as a    scheduled prescription  medication. ==================================================================== Test                      Result    Flag   Units      Ref Range   Creatinine              188              mg/dL      >=20 ==================================================================== Declared Medications:  The flagging and interpretation on this report are based on the  following declared medications.  Unexpected results may arise from  inaccuracies in the declared medications.  **Note: The testing scope of this panel includes these medications:  Hydrocodone (Hydrocodone-Acetaminophen)  **Note: The testing scope of this panel does not include following  reported medications:  Acetaminophen (Hydrocodone-Acetaminophen)  Acyclovir  Amlodipine Besylate  Aspirin  Calcium carbonate  Citalopram  Cyclobenzaprine  Gabapentin  Glucosamine  Hydrochlorothiazide  Letrozole  Losartan (Losartan Potassium)  Lubiprostone  Magnesium Oxide  Meloxicam  Metoprolol  Multivitamin  Omega-3 Fatty Acids  Omeprazole  Pantoprazole  Pravastatin ==================================================================== For clinical consultation, please call [phone]. ====================================================================      ROS  Constitutional: Denies any fever or chills Gastrointestinal: No reported hemesis, hematochezia, vomiting, or acute GI distress Musculoskeletal: Denies any acute onset joint swelling, redness, loss of ROM, or weakness Neurological: No reported episodes of acute  onset apraxia, aphasia, dysarthria, agnosia, amnesia, paralysis, loss of coordination, or loss of consciousness  Medication Review  Glucosamine Sulfate, HYDROcodone-acetaminophen, Omega-3, amLODipine, aspirin, calcium carbonate, citalopram, cyclobenzaprine, donepezil, gabapentin, hydrochlorothiazide, letrozole, losartan, lubiprostone, magnesium oxide, meloxicam, metoprolol succinate, multivitamin, nitrofurantoin  (macrocrystal-monohydrate), nystatin, omeprazole, pantoprazole, and pravastatin  History Review  Allergy: Wanda Martinez is allergic to sulfa antibiotics and vesicare [solifenacin]. Drug: Wanda Martinez  reports no history of drug use. Alcohol:  reports no history of alcohol use. Tobacco:  reports that she has never smoked. She has never used smokeless tobacco. Social: Wanda Martinez  reports that she has never smoked. She has never used smokeless tobacco. She reports that she does not drink alcohol and does not use drugs. Medical:  has a past medical history of Acute postoperative pain (10/25/2017), Anemia, Arm pain (07/26/2015), Arthritis, Arthritis, degenerative (03/26/2014), Back pain (11/01/2013), Bladder infection (06/2018), Breast cancer ([place]), Breast cancer ([place]), CHEST PAIN (04/29/2010), Chronic cystitis, Cystocele (02/22/2013), Cystocele, midline (08/19/2013), Degeneration of intervertebral disc of lumbosacral region (03/26/2014), DYSPNEA (04/29/2010), Enthesopathy of hip (03/26/2014), GERD (gastroesophageal reflux disease), Hiatal hernia, HTN (hypertension), Hypokalemia (06/2018), Hyponatremia (06/2018), LBP (low back pain) (03/26/2014), Neck pain (11/01/2013), Parkinson disease ([place]), Pneumonia (06/2018), Sinusitis (02/07/2015), Skin lesions (07/12/2014), and Urinary incontinence. Surgical: Wanda Martinez  has a past surgical history that includes Tonsillectomy and adenoidectomy (81); Vesicovaginal fistula closure w/ TAH (1983); Breast surgery (1986); Breast enhancement surgery (1987); Breast implant removal; Breast implant removal (Right, 08/29/2012); Mastectomy (08/2012); Abdominal hysterectomy; Colonoscopy with propofol (N/A, 09/13/2016); and Breast biopsy (2013). Family: family history includes Colon polyps in her father; Diabetes in her father; Parkinson's disease in her mother; Stroke in her father and mother.  Laboratory Chemistry Profile   Renal Lab Results  Component Value Date   BUN 20 06/11/2020   CREATININE 0.99  (H) 06/11/2020   BCR 20 06/11/2020   GFR 44.26 (L) 01/05/2020   GFRAA 59 (L) 01/27/2020   GFRNONAA 51 (L) 01/27/2020     Hepatic Lab Results  Component Value Date   AST 20 06/11/2020   ALT 19 06/11/2020   ALBUMIN 4.4 10/17/2019   ALKPHOS 50 10/17/2019     Electrolytes Lab Results  Component Value Date   NA 137 06/11/2020   K 3.5 06/11/2020   CL 101 06/11/2020   CALCIUM 9.0 06/11/2020   MG 2.2 07/17/2018     Bone Lab Results  Component Value Date   25OHVITD1 42 07/17/2018   25OHVITD2 <1.0 07/17/2018   25OHVITD3 42 07/17/2018     Inflammation (CRP: Acute Phase) (ESR: Chronic Phase) Lab Results  Component Value Date   CRP 3 07/17/2018   ESRSEDRATE 27 07/17/2018       Note: Above Lab results reviewed.  Recent Imaging Review  DG Bone Density EXAM: DUAL X-RAY ABSORPTIOMETRY (DXA) FOR BONE MINERAL DENSITY  IMPRESSION: Your patient Wanda Martinez completed a BMD test on 07/01/2020 using the Dixon (software version: 14.10) manufactured by [employer]. The following summarizes the results of our evaluation. Technologist: [facility] PATIENT BIOGRAPHICAL: Name: Freada, [place] Patient ID: [id number] Birth Date: 1934/12/28 Height: 63.0 in. Gender: Female Exam Date: 07/01/2020 Weight: 190.9 lbs. Indications: High Risk Meds, Advanced Age, Osteoarthritis, Postmenopausal, Hysterectomy, Breast CA, Caucasian, History of Breast Cancer, Height Loss Fractures: Treatments: Calcium, Gabapentin, Multi-Vitamin, Omeprazole, Protonix, Vitamin D DENSITOMETRY RESULTS: Site         Region     Measured Date Measured Age WHO Classification Young Adult T-score BMD         %  Change vs. Previous Significant Change (*) DualFemur Neck Left 07/01/2020 82.7 Osteopenia -2.4 0.702 g/cm2 7.5% - DualFemur Neck Left 07/01/2019 81.7 Osteoporosis -2.8 0.653 g/cm2 -6.6% - DualFemur Neck Left 07/10/2018 80.7 Osteopenia -2.4 0.699 g/cm2 -2.6% - DualFemur Neck Left 06/25/2017  79.6 Osteopenia -2.3 0.718 g/cm2 0.8% - DualFemur Neck Left 06/21/2016 78.6 Osteopenia -2.3 0.712 g/cm2 -9.6% Yes DualFemur Neck Left 06/15/2015 77.6 Osteopenia -1.8 0.788 g/cm2 3.1% - DualFemur Neck Left 08/21/2013 75.8 Osteopenia -2.0 0.764 g/cm2 - -  DualFemur Total Mean 07/01/2020 82.7 Osteopenia -2.1 0.748 g/cm2 8.7% Yes DualFemur Total Mean 07/01/2019 81.7 Osteoporosis -2.5 0.688 g/cm2 -7.4% Yes DualFemur Total Mean 07/10/2018 80.7 Osteopenia -2.1 0.743 g/cm2 1.2% - DualFemur Total Mean 06/25/2017 79.6 Osteopenia -2.2 0.734 g/cm2 -1.1% - DualFemur Total Mean 06/21/2016 78.6 Osteopenia -2.1 0.742 g/cm2 -1.9% - DualFemur Total Mean 06/15/2015 77.6 Osteopenia -2.0 0.756 g/cm2 -4.2% Yes DualFemur Total Mean 08/21/2013 75.8 Osteopenia -1.7 0.789 g/cm2 - -  Left Forearm Radius 33% 07/01/2020 82.7 Osteoporosis -2.9 0.624 g/cm2 1.5% - Left Forearm Radius 33% 07/01/2019 81.7 Osteoporosis -3.0 0.615 g/cm2 -4.9% - Left Forearm Radius 33% 07/10/2018 80.7 Osteoporosis -2.6 0.647 g/cm2 -6.9% Yes Left Forearm Radius 33% 06/25/2017 79.6 Osteopenia -2.1 0.695 g/cm2 -2.4% - Left Forearm Radius 33% 06/21/2016 78.6 Osteopenia -1.9 0.712 g/cm2 11.6% Yes Left Forearm Radius 33% 06/15/2015 77.6 Osteoporosis -2.7 0.638 g/cm2 -11.9% Yes Left Forearm Radius 33% 08/21/2013 75.8 Osteopenia -1.7 0.724 g/cm2 - - ASSESSMENT: The BMD measured at Forearm Radius 33% is 0.624 g/cm2 with a T-score of -2.9. This patient is considered osteoporotic according to [facility]) criteria. Lumbar spine was not utilized due to advanced degenerative changes. The scan quality is good. Compared with prior study, there has been significant increase in the total hip. [facility]) criteria for post-menopausal, Caucasian Women: Normal:                   T-score at or above -1 SD Osteopenia/low bone mass: T-score between -1 and -2.5 SD Osteoporosis:             T-score at or  below -2.5 SD  RECOMMENDATIONS: 1. All patients should optimize calcium and vitamin D intake. 2. Consider FDA-approved medical therapies in postmenopausal women and men aged 60 years and older, based on the following: a. A hip or vertebral(clinical or morphometric) fracture b. T-score < -2.5 at the femoral neck or spine after appropriate evaluation to exclude secondary causes c. Low bone mass (T-score between -1.0 and -2.5 at the femoral neck or spine) and a 10-year probability of a hip fracture > 3% or a 10-year probability of a major osteoporosis-related fracture > 20% based on the [place]-adapted WHO algorithm 3. Clinician judgment and/or patient preferences may indicate treatment for people with 10-year fracture probabilities above or below these levels FOLLOW-UP: People with diagnosed cases of osteoporosis or at high risk for fracture should have regular bone mineral density tests. For patients eligible for Medicare, routine testing is allowed once every 2 years. The testing frequency can be increased to one year for patients who have rapidly progressing disease, those who are receiving or discontinuing medical therapy to restore bone mass, or have additional risk factors.  I have reviewed this report, and agree with the above findings.  [facility]  Electronically Signed   By: Kerby Moors M.D.   On: 07/02/2020 09:51 Note: Reviewed        Physical Exam  General appearance: Well nourished, well developed, and  well hydrated. In no apparent acute distress Mental status: Alert, oriented x 3 (person, place, & time)       Respiratory: No evidence of acute respiratory distress Eyes: PERLA Vitals: BP 136/76 (BP Location: Left Arm, Patient Position: Sitting, Cuff Size: Normal)   Pulse 60   Temp (!) 97.3 F (36.3 C) (Temporal)   Resp 17   Ht _0  (1.626 m)   Wt 183 lb (83 kg)   LMP 12/18/1981   SpO2 99%   BMI 31.41 kg/m  BMI: Estimated body mass index is  31.41 kg/m as calculated from the following:   Height as of this encounter: _1  (1.626 m).   Weight as of this encounter: 183 lb (83 kg). Ideal: Ideal body weight: 54.7 kg (120 lb 9.5 oz) Adjusted ideal body weight: 66 kg (145 lb 8.9 oz)  Assessment   Status Diagnosis  Controlled Controlled Controlled 1. Chronic pain syndrome   2. Chronic low back pain (Primary Area of Pain) (Bilateral) (R>L)   3. Chronic lower extremity pain (Secondary Area of Pain) (Right)   4. Neurogenic pain   5. Pharmacologic therapy      Updated Problems: No problems updated.  Plan of Care  Problem-specific:  No problem-specific Assessment & Plan notes found for this encounter.  Wanda Martinez has a current medication list which includes the following long-term medication(s): amlodipine, calcium carbonate, cyclobenzaprine, donepezil, gabapentin, hydrochlorothiazide, [START ON 07/26/2020] hydrocodone-acetaminophen, [START ON 08/25/2020] hydrocodone-acetaminophen, [START ON 09/24/2020] hydrocodone-acetaminophen, losartan, magnesium oxide, meloxicam, metoprolol succinate, omeprazole, pantoprazole, and pravastatin.  Pharmacotherapy (Medications Ordered): Meds ordered this encounter  Medications  . HYDROcodone-acetaminophen (NORCO/VICODIN) 5-325 MG tablet    Sig: Take 1 tablet by mouth every 8 (eight) hours as needed for severe pain. Must last 30 days    Dispense:  90 tablet    Refill:  0    Chronic Pain: STOP Act (Not applicable) Fill 1 day early if closed on refill date. Do not fill until: 07/26/2020. To last until: 08/25/2020. Avoid benzodiazepines within 8 hours of opioids  . HYDROcodone-acetaminophen (NORCO/VICODIN) 5-325 MG tablet    Sig: Take 1 tablet by mouth every 8 (eight) hours as needed for severe pain. Must last 30 days    Dispense:  90 tablet    Refill:  0    Chronic Pain: STOP Act (Not applicable) Fill 1 day early if closed on refill date. Do not fill until: 08/25/2020. To last until:  09/24/2020. Avoid benzodiazepines within 8 hours of opioids  . HYDROcodone-acetaminophen (NORCO/VICODIN) 5-325 MG tablet    Sig: Take 1 tablet by mouth every 8 (eight) hours as needed for severe pain. Must last 30 days    Dispense:  90 tablet    Refill:  0    Chronic Pain: STOP Act (Not applicable) Fill 1 day early if closed on refill date. Do not fill until: 09/24/2020. To last until: 10/24/2020. Avoid benzodiazepines within 8 hours of opioids  . gabapentin (NEURONTIN) 100 MG capsule    Sig: Take 2 capsules (200 mg total) by mouth 2 (two) times daily AND 3-4 capsules (300-400 mg total) at bedtime.    Dispense:  240 capsule    Refill:  2   Orders:  Orders Placed This Encounter  Procedures  . ToxASSURE Select 13 (MW), Urine    Volume: 30 ml(s). Minimum 3 ml of urine is needed. Document temperature of fresh sample. Indications: Long term (current) use of opiate analgesic [phone])    Order Specific Question:  Release to patient    Answer:   Immediate   Follow-up plan:   Return in about 3 months (around 10/04/2020) for 15-min, F2F, MM (on eval day) to evaluate Neurontin titration.      Interventional treatment options:  Under consideration: Diagnosticbilateral cervical facet block  Possiblebilateral cervical facet RFA    Therapeutic/palliative (PRN): Diagnostic right CESI #2  Palliative bilateral trochanteric bursa injection #2  Diagnosticright IA hip joint injection #2  Palliative right lumbar facet block #7  Palliative left lumbar facet block #4  Palliative right lumbar facet RFA #3(last done06/18/19) Palliative left lumbar facet RFA #2 (last done 02/12/2018) Diagnostic/Therapeutic right SI block #2  Diagnostic/Therapeutic left SI block #2  Palliative right S-I RFA #4 (last done 06/05/2019) Palliative right suprascapular muscle TPI/MNB #3  Palliativeright L3-4 LESI#3 Palliative right L5 TFESI #4 Diagnostic right L5-S1 LESI #3  Diagnostic/therapeutic right L4-5 LESI  #2 (100/100/80/80)     Recent Visits Date Type Provider Dept  06/10/20 Telemedicine Milinda Pointer, MD [facility]  05/20/20 Procedure visit Milinda Pointer, MD [facility]  Showing recent visits within past 90 days and meeting all other requirements Today's Visits Date Type Provider Dept  07/07/20 Office Visit Milinda Pointer, MD [facility]  Showing today's visits and meeting all other requirements Future Appointments Date Type Provider Dept  09/27/20 Appointment Milinda Pointer, MD [facility]  Showing future appointments within next 90 days and meeting all other requirements  I discussed the assessment and treatment plan with the patient. The patient was provided an opportunity to ask questions and all were answered. The patient agreed with the plan and demonstrated an understanding of the instructions.  Patient advised to call back or seek an in-person evaluation if the symptoms or condition worsens.  Duration of encounter: 30 minutes.  Note by: Gaspar Cola, MD Date: 07/07/2020; Time: 4:28 PM

## 2020-07-07 ENCOUNTER — Encounter: Payer: Self-pay | Admitting: Pain Medicine

## 2020-07-07 ENCOUNTER — Other Ambulatory Visit: Payer: Self-pay

## 2020-07-07 ENCOUNTER — Ambulatory Visit: Payer: Medicare Other | Attending: Pain Medicine | Admitting: Pain Medicine

## 2020-07-07 VITALS — BP 136/76 | HR 60 | Temp 97.3°F | Resp 17 | Ht 64.0 in | Wt 183.0 lb

## 2020-07-07 DIAGNOSIS — M545 Low back pain, unspecified: Secondary | ICD-10-CM

## 2020-07-07 DIAGNOSIS — Z79899 Other long term (current) drug therapy: Secondary | ICD-10-CM | POA: Insufficient documentation

## 2020-07-07 DIAGNOSIS — M792 Neuralgia and neuritis, unspecified: Secondary | ICD-10-CM | POA: Diagnosis not present

## 2020-07-07 DIAGNOSIS — M79604 Pain in right leg: Secondary | ICD-10-CM

## 2020-07-07 DIAGNOSIS — G8929 Other chronic pain: Secondary | ICD-10-CM | POA: Diagnosis not present

## 2020-07-07 DIAGNOSIS — G894 Chronic pain syndrome: Secondary | ICD-10-CM | POA: Insufficient documentation

## 2020-07-07 MED ORDER — HYDROCODONE-ACETAMINOPHEN 5-325 MG PO TABS: 1.0000 | ORAL_TABLET | Freq: Three times a day (TID) | ORAL | 0 refills | Status: DC | PRN

## 2020-07-07 MED ORDER — GABAPENTIN 100 MG PO CAPS: ORAL_CAPSULE | ORAL | 2 refills | Status: DC

## 2020-07-07 NOTE — Progress Notes (Signed)
Nursing Pain Medication Assessment:  Safety precautions to be maintained throughout the outpatient stay will include: orient to surroundings, keep bed in low position, maintain call bell within reach at all times, provide assistance with transfer out of bed and ambulation.  Medication Inspection Compliance: Wanda Martinez did not comply with our request to bring her pills to be counted. She was reminded that bringing the medication bottles, even when empty, is a requirement.  Medication: None brought in. Pill/Patch Count: None available to be counted. Bottle Appearance: No container available. Did not bring bottle(s) to appointment. Filled Date: N/A Last Medication intake:  Yesterday

## 2020-07-07 NOTE — Patient Instructions (Signed)
____________________________________________________________________________________________  Drug Holidays (Slow)  What is a "Drug Holiday"? Drug Holiday: is the name given to the period of time during which a patient stops taking a medication(s) for the purpose of eliminating tolerance to the drug.  Benefits . Improved effectiveness of opioids. . Decreased opioid dose needed to achieve benefits. . Improved pain with lesser dose.  What is tolerance? Tolerance: is the progressive decreased in effectiveness of a drug due to its repetitive use. With repetitive use, the body gets use to the medication and as a consequence, it loses its effectiveness. This is a common problem seen with opioid pain medications. As a result, a larger dose of the drug is needed to achieve the same effect that used to be obtained with a smaller dose.  How long should a "Drug Holiday" last? You should stay off of the pain medicine for at least 14 consecutive days. (2 weeks)  Should I stop the medicine "cold turkey"? No. You should always coordinate with your Pain Specialist so that he/she can provide you with the correct medication dose to make the transition as smoothly as possible.  How do I stop the medicine? Slowly. You will be instructed to decrease the daily amount of pills that you take by one (1) pill every seven (7) days. This is called a "slow downward taper" of your dose. For example: if you normally take four (4) pills per day, you will be asked to drop this dose to three (3) pills per day for seven (7) days, then to two (2) pills per day for seven (7) days, then to one (1) per day for seven (7) days, and at the end of those last seven (7) days, this is when the "Drug Holiday" would start.   Will I have withdrawals? By doing a "slow downward taper" like this one, it is unlikely that you will experience any significant withdrawal symptoms. Typically, what triggers withdrawals is the sudden stop of a high  dose opioid therapy. Withdrawals can usually be avoided by slowly decreasing the dose over a prolonged period of time. If you do not follow these instructions and decide to stop your medication abruptly, withdrawals may be possible.  What are withdrawals? Withdrawals: refers to the wide range of symptoms that occur after stopping or dramatically reducing opiate drugs after heavy and prolonged use. Withdrawal symptoms do not occur to patients that use low dose opioids, or those who take the medication sporadically. Contrary to benzodiazepine (example: Valium, Xanax, etc.) or alcohol withdrawals ("Delirium Tremens"), opioid withdrawals are not lethal. Withdrawals are the physical manifestation of the body getting rid of the excess receptors.  Expected Symptoms Early symptoms of withdrawal may include: . Agitation . Anxiety . Muscle aches . Increased tearing . Insomnia . Runny nose . Sweating . Yawning  Late symptoms of withdrawal may include: . Abdominal cramping . Diarrhea . Dilated pupils . Goose bumps . Nausea . Vomiting  Will I experience withdrawals? Due to the slow nature of the taper, it is very unlikely that you will experience any.  What is a slow taper? Taper: refers to the gradual decrease in dose.  (Last update: 07/07/2020) ____________________________________________________________________________________________    ____________________________________________________________________________________________  Medication Rules  Purpose: To inform patients, and their family members, of our rules and regulations.  Applies to: All patients receiving prescriptions (written or electronic).  Pharmacy of record: Pharmacy where electronic prescriptions will be sent. If written prescriptions are taken to a different pharmacy, please inform the nursing staff. The pharmacy   listed in the electronic medical record should be the one where you would like electronic prescriptions  to be sent.  Electronic prescriptions: In compliance with the [place] Strengthen Opioid Misuse Prevention (STOP) Act of 2017 (Session Law 2017-74/H243), effective December 18, 2018, all controlled substances must be electronically prescribed. Calling prescriptions to the pharmacy will cease to exist.  Prescription refills: Only during scheduled appointments. Applies to all prescriptions.  NOTE: The following applies primarily to controlled substances (Opioid* Pain Medications).   Type of encounter (visit): For patients receiving controlled substances, face-to-face visits are required. (Not an option or up to the patient.)  Patient's responsibilities: 1. Pain Pills: Bring all pain pills to every appointment (except for procedure appointments). 2. Pill Bottles: Bring pills in original pharmacy bottle. Always bring the newest bottle. Bring bottle, even if empty. 3. Medication refills: You are responsible for knowing and keeping track of what medications you take and those you need refilled. The day before your appointment: write a list of all prescriptions that need to be refilled. The day of the appointment: give the list to the admitting nurse. Prescriptions will be written only during appointments. No prescriptions will be written on procedure days. If you forget a medication: it will not be "Called in", "Faxed", or "electronically sent". You will need to get another appointment to get these prescribed. No early refills. Do not call asking to have your prescription filled early. 4. Prescription Accuracy: You are responsible for carefully inspecting your prescriptions before leaving our office. Have the discharge nurse carefully go over each prescription with you, before taking them home. Make sure that your name is accurately spelled, that your address is correct. Check the name and dose of your medication to make sure it is accurate. Check the number of pills, and the written instructions to  make sure they are clear and accurate. Make sure that you are given enough medication to last until your next medication refill appointment. 5. Taking Medication: Take medication as prescribed. When it comes to controlled substances, taking less pills or less frequently than prescribed is permitted and encouraged. Never take more pills than instructed. Never take medication more frequently than prescribed.  6. Inform other Doctors: Always inform, all of your healthcare providers, of all the medications you take. 7. Pain Medication from other Providers: You are not allowed to accept any additional pain medication from any other Doctor or Healthcare provider. There are two exceptions to this rule. (see below) In the event that you require additional pain medication, you are responsible for notifying us, as stated below. 8. Medication Agreement: You are responsible for carefully reading and following our Medication Agreement. This must be signed before receiving any prescriptions from our practice. Safely store a copy of your signed Agreement. Violations to the Agreement will result in no further prescriptions. (Additional copies of our Medication Agreement are available upon request.) 9. Laws, Rules, & Regulations: All patients are expected to follow all Federal and State Laws, Statutes, Rules, & Regulations. Ignorance of the Laws does not constitute a valid excuse.  10. Illegal drugs and Controlled Substances: The use of illegal substances (including, but not limited to marijuana and its derivatives) and/or the illegal use of any controlled substances is strictly prohibited. Violation of this rule may result in the immediate and permanent discontinuation of any and all prescriptions being written by our practice. The use of any illegal substances is prohibited. 11. Adopted CDC guidelines & recommendations: Target dosing levels will be at or   below 60 MME/day. Use of benzodiazepines** is not  recommended.  Exceptions: There are only two exceptions to the rule of not receiving pain medications from other Healthcare Providers. 1. Exception #1 (Emergencies): In the event of an emergency (i.e.: accident requiring emergency care), you are allowed to receive additional pain medication. However, you are responsible for: As soon as you are able, call our office [phone], at any time of the day or night, and leave a message stating your name, the date and nature of the emergency, and the name and dose of the medication prescribed. In the event that your call is answered by a member of our staff, make sure to document and save the date, time, and the name of the person that took your information.  2. Exception #2 (Planned Surgery): In the event that you are scheduled by another doctor or dentist to have any type of surgery or procedure, you are allowed (for a period no longer than 30 days), to receive additional pain medication, for the acute post-op pain. However, in this case, you are responsible for picking up a copy of our "Post-op Pain Management for Surgeons" handout, and giving it to your surgeon or dentist. This document is available at our office, and does not require an appointment to obtain it. Simply go to our office during business hours (Monday-Thursday from 8:00 AM to 4:00 PM) (Friday 8:00 AM to 12:00 Noon) or if you have a scheduled appointment with us, prior to your surgery, and ask for it by name. In addition, you will need to provide us with your name, name of your surgeon, type of surgery, and date of procedure or surgery.  *Opioid medications include: morphine, codeine, oxycodone, oxymorphone, hydrocodone, hydromorphone, meperidine, tramadol, tapentadol, buprenorphine, fentanyl, methadone. **Benzodiazepine medications include: diazepam (Valium), alprazolam (Xanax), clonazepam (Klonopine), lorazepam (Ativan), clorazepate (Tranxene), chlordiazepoxide (Librium), estazolam (Prosom),  oxazepam (Serax), temazepam (Restoril), triazolam (Halcion) (Last updated: 02/14/2018) ____________________________________________________________________________________________   ____________________________________________________________________________________________  Medication Recommendations and Reminders  Applies to: All patients receiving prescriptions (written and/or electronic).  Medication Rules & Regulations: These rules and regulations exist for your safety and that of others. They are not flexible and neither are we. Dismissing or ignoring them will be considered "non-compliance" with medication therapy, resulting in complete and irreversible termination of such therapy. (See document titled "Medication Rules" for more details.) In all conscience, because of safety reasons, we cannot continue providing a therapy where the patient does not follow instructions.  Pharmacy of record:   Definition: This is the pharmacy where your electronic prescriptions will be sent.   We do not endorse any particular pharmacy, however, we have experienced problems with [facility] not securing enough medication supply for the community.  We do not restrict you in your choice of pharmacy. However, once we write for your prescriptions, we will NOT be re-sending more prescriptions to fix restricted supply problems created by your pharmacy, or your insurance.   The pharmacy listed in the electronic medical record should be the one where you want electronic prescriptions to be sent.  If you choose to change pharmacy, simply notify our nursing staff.  Recommendations:  Keep all of your pain medications in a safe place, under lock and key, even if you live alone. We will NOT replace lost, stolen, or damaged medication.  After you fill your prescription, take 1 week's worth of pills and put them away in a safe place. You should keep a separate, properly labeled bottle for this purpose. The remainder    should be kept in the original bottle. Use this as your primary supply, until it runs out. Once it's gone, then you know that you have 1 week's worth of medicine, and it is time to come in for a prescription refill. If you do this correctly, it is unlikely that you will ever run out of medicine.  To make sure that the above recommendation works, it is very important that you make sure your medication refill appointments are scheduled at least 1 week before you run out of medicine. To do this in an effective manner, make sure that you do not leave the office without scheduling your next medication management appointment. Always ask the nursing staff to show you in your prescription , when your medication will be running out. Then arrange for the receptionist to get you a return appointment, at least 7 days before you run out of medicine. Do not wait until you have 1 or 2 pills left, to come in. This is very poor planning and does not take into consideration that we may need to cancel appointments due to bad weather, sickness, or emergencies affecting our staff.  DO NOT ACCEPT A "Partial Fill": If for any reason your pharmacy does not have enough pills/tablets to completely fill or refill your prescription, do not allow for a "partial fill". The law allows the pharmacy to complete that prescription within 72 hours, without requiring a new prescription. If they do not fill the rest of your prescription within those 72 hours, you will need a separate prescription to fill the remaining amount, which we will NOT provide. If the reason for the partial fill is your insurance, you will need to talk to the pharmacist about payment alternatives for the remaining tablets, but again, DO NOT ACCEPT A PARTIAL FILL, unless you can trust your pharmacist to obtain the remainder of the pills within 72 hours.  Prescription refills and/or changes in medication(s):   Prescription refills, and/or changes in dose or medication,  will be conducted only during scheduled medication management appointments. (Applies to both, written and electronic prescriptions.)  No refills on procedure days. No medication will be changed or started on procedure days. No changes, adjustments, and/or refills will be conducted on a procedure day. Doing so will interfere with the diagnostic portion of the procedure.  No phone refills. No medications will be "called into the pharmacy".  No Fax refills.  No weekend refills.  No Holliday refills.  No after hours refills.  Remember:  Business hours are:  Monday to Thursday 8:00 AM to 4:00 PM Provider's Schedule: Matthieu Loftus, MD - Appointments are:  Medication management: Monday and Wednesday 8:00 AM to 4:00 PM Procedure day: Tuesday and Thursday 7:30 AM to 4:00 PM Bilal Lateef, MD - Appointments are:  Medication management: Tuesday and Thursday 8:00 AM to 4:00 PM Procedure day: Monday and Wednesday 7:30 AM to 4:00 PM (Last update: 07/07/2020) ____________________________________________________________________________________________   ____________________________________________________________________________________________  CANNABIDIOL (AKA: CBD Oil or Pills)  Applies to: All patients receiving prescriptions of controlled substances (written and/or electronic).  General Information: Cannabidiol (CBD), a derivative of Marijuana, was discovered in 1940. It is one of some 113 identified cannabinoids in cannabis (Marijuana) plants, accounting for up to 40% of the plant's extract. As of 2018, preliminary clinical research on cannabidiol included studies of anxiety, cognition, movement disorders, and pain.  Cannabidiol is consummed in multiple ways, including inhalation of cannabis smoke or vapor, as an aerosol spray into the cheek, and by mouth. It   may be supplied as CBD oil containing CBD as the active ingredient (no added tetrahydrocannabinol (THC) or terpenes), a full-plant  CBD-dominant hemp extract oil, capsules, dried cannabis, or as a liquid solution. CBD is thought not have the same psychoactivity as THC, and may affect the actions of THC. Studies suggest that CBD may interact with different biological targets, including cannabinoid receptors and other neurotransmitter receptors. As of 2018 the mechanism of action for its biological effects has not been determined.  In the [place], cannabidiol has a limited approval by the Food and Drug Administration (FDA) for treatment of only two types of epilepsy disorders. The side effects of long-term use of the drug include somnolence, decreased appetite, diarrhea, fatigue, malaise, weakness, sleeping problems, and others.  CBD remains a Schedule I drug prohibited for any use.  Legality: Some manufacturers ship CBD products nationally, an illegal action which the FDA has not enforced in 2018, with CBD remaining the subject of an FDA investigational new drug evaluation, and is not considered legal as a dietary supplement or food ingredient as of December 2018. Federal illegality has made it difficult historically to conduct research on CBD. CBD is openly sold in head shops and health food stores in some states where such sales have not been explicitly legalized.  Warning: Because it is not FDA approved for general use or treatment of pain, it is not required to undergo the same manufacturing controls as prescription drugs.  This means that the available cannabidiol (CBD) may be contaminated with THC.  If this is the case, it will trigger a positive urine drug screen (UDS) test for cannabinoids (Marijuana).  Because a positive UDS for illicit substances is a violation of our medication agreement, your opioid analgesics (pain medicine) may be permanently discontinued. (Last update: 07/07/2020) ____________________________________________________________________________________________    

## 2020-07-12 ENCOUNTER — Ambulatory Visit (INDEPENDENT_AMBULATORY_CARE_PROVIDER_SITE_OTHER): Payer: Medicare Other

## 2020-07-12 VITALS — Ht 64.0 in | Wt 183.0 lb

## 2020-07-12 DIAGNOSIS — Z0000 Encounter for general adult medical examination without abnormal findings: Secondary | ICD-10-CM

## 2020-07-12 LAB — TOXASSURE SELECT 13 (MW), URINE

## 2020-07-12 NOTE — Progress Notes (Addendum)
Subjective:   Wanda Martinez is a 85 y.o. female who presents for Medicare Annual (Subsequent) preventive examination.  Review of Systems    No ROS.  Medicare Wellness Virtual Visit.   Cardiac Risk Factors include: advanced age (>23men, >47 women)     Objective:    Today's Vitals   07/12/20 1309  Weight: 183 lb (83 kg)  Height: 5\' 4"  (1.626 m)   Body mass index is 31.41 kg/m.  Advanced Directives 07/12/2020 07/07/2020 05/20/2020 03/18/2020 01/27/2020 12/04/2019 11/20/2019  Does Patient Have a Medical Advance Directive? No No No No No No No  Would patient like information on creating a medical advance directive? No - Patient declined No - Patient declined No - Patient declined No - Patient declined - - No - Patient declined    Current Medications (verified) Outpatient Encounter Medications as of 07/12/2020  Medication Sig  . amLODipine (NORVASC) 2.5 MG tablet TAKE 1 TABLET BY MOUTH ONCE DAILY.  Marland Kitchen aspirin 81 MG EC tablet Take 81 mg by mouth daily as needed.    . Calcium Carbonate (CALCIUM 600) 1500 MG TABS Take 1 tablet by mouth daily.    . citalopram (CELEXA) 20 MG tablet TAKE 1 & 1/2 TABLETS BY MOUTH ONCE DAILY  . cyclobenzaprine (FLEXERIL) 10 MG tablet TAKE ONE TABLET BY MOUTH AT BEDTIME.  Marland Kitchen donepezil (ARICEPT) 5 MG tablet   . gabapentin (NEURONTIN) 100 MG capsule Take 2 capsules (200 mg total) by mouth 2 (two) times daily AND 3-4 capsules (300-400 mg total) at bedtime.  Marland Kitchen GLUCOSAMINE SULFATE PO Take by mouth daily.   . hydrochlorothiazide (HYDRODIURIL) 25 MG tablet TAKE 1 TABLET BY MOUTH ONCE DAILY. (PATIENT TAKES 1/2 ONCE DAILY AND EXTRA 1/2 IF NEEDED)  . [START ON 07/26/2020] HYDROcodone-acetaminophen (NORCO/VICODIN) 5-325 MG tablet Take 1 tablet by mouth every 8 (eight) hours as needed for severe pain. Must last 30 days  . [START ON 08/25/2020] HYDROcodone-acetaminophen (NORCO/VICODIN) 5-325 MG tablet Take 1 tablet by mouth every 8 (eight) hours as needed for severe pain. Must  last 30 days  . [START ON 09/24/2020] HYDROcodone-acetaminophen (NORCO/VICODIN) 5-325 MG tablet Take 1 tablet by mouth every 8 (eight) hours as needed for severe pain. Must last 30 days  . letrozole (FEMARA) 2.5 MG tablet Take 2.5 mg by mouth daily.  Marland Kitchen losartan (COZAAR) 50 MG tablet TAKE (2) TABLETS BY MOUTH ONCE DAILY.  Marland Kitchen lubiprostone (AMITIZA) 24 MCG capsule TAKE 1 CAPSULE BY MOUTH ONCE DAILY WITH BREAKFAST.  . magnesium oxide (MAG-OX) 400 (241.3 Mg) MG tablet TAKE 1 TABLET BY MOUTH ONCE DAILY.  . meloxicam (MOBIC) 7.5 MG tablet TAKE 1 TABLET BY MOUTH ONCE DAILY.  . metoprolol succinate (TOPROL-XL) 25 MG 24 hr tablet TAKE 1 TABLET BY MOUTH TWICE DAILY  . Multiple Vitamin (MULTIVITAMIN) tablet Take 1 tablet by mouth daily.  . nitrofurantoin, macrocrystal-monohydrate, (MACROBID) 100 MG capsule Take 100 mg by mouth in the morning and at bedtime.  Marland Kitchen nystatin (MYCOSTATIN) 100000 UNIT/ML suspension Swish and spit tid  . Omega-3 1000 MG CAPS Take by mouth daily.   Marland Kitchen omeprazole (PRILOSEC) 20 MG capsule TAKE 1 CAPSULE BY MOUTH TWICE DAILY FOR REFLUX.  . pantoprazole (PROTONIX) 40 MG tablet TAKE 1 TABLET BY MOUTH ONCE DAILY.  . pravastatin (PRAVACHOL) 20 MG tablet TAKE 1 TABLET BY MOUTH ONCE DAILY.   No facility-administered encounter medications on file as of 07/12/2020.    Allergies (verified) Sulfa antibiotics and Vesicare [solifenacin]   History: Past Medical History:  Diagnosis Date  . Acute postoperative pain 10/25/2017  . Anemia   . Arm pain 07/26/2015  . Arthritis   . Arthritis, degenerative 03/26/2014  . Back pain 11/01/2013  . Bladder infection 06/2018  . Breast cancer ([place])    Masectomy - left - 1986   . Breast cancer [facility])    Mastectomy-right -2014  . CHEST PAIN 04/29/2010   Qualifier: Diagnosis of  By: Wynetta Emery RN, Doroteo Bradford    . Chronic cystitis   . Cystocele 02/22/2013  . Cystocele, midline 08/19/2013  . Degeneration of intervertebral disc of lumbosacral region 03/26/2014  . DYSPNEA  04/29/2010   Qualifier: Diagnosis of  By: Wynetta Emery RN, Doroteo Bradford    . Enthesopathy of hip 03/26/2014  . GERD (gastroesophageal reflux disease)   . Hiatal hernia   . HTN (hypertension)   . Hypokalemia 06/2018  . Hyponatremia 06/2018  . LBP (low back pain) 03/26/2014  . Neck pain 11/01/2013  . Parkinson disease ([place])   . Pneumonia 06/2018  . Sinusitis 02/07/2015  . Skin lesions 07/12/2014  . Urinary incontinence    mixed    Past Surgical History:  Procedure Laterality Date  . ABDOMINAL HYSTERECTOMY    . BREAST BIOPSY  2013  . BREAST ENHANCEMENT SURGERY  1987  . BREAST IMPLANT REMOVAL    . BREAST IMPLANT REMOVAL Right 08/29/2012  . BREAST SURGERY  1986   s/p left mastectomy  . COLONOSCOPY WITH PROPOFOL N/A 09/13/2016   Procedure: COLONOSCOPY WITH PROPOFOL;  Surgeon: Manya Silvas, MD;  Location[facility] END[facility]COPY;  Service: Endoscopy;  Laterality: N/A;  . MASTECTOMY  08/2012   right  . TONSILLECTOMY AND ADENOIDECTOMY  79  . VESICOVAGINAL FISTULA CLOSURE W/ TAH  1983   Family History  Problem Relation Age of Onset  . Diabetes Father   . Stroke Father   . Colon polyps Father   . Stroke Mother   . Parkinson's disease Mother    Social History   Socioeconomic History  . Marital status: Married    Spouse name: Not on file  . Number of children: 3  . Years of education: Not on file  . Highest education level: Not on file  Occupational History  . Not on file  Tobacco Use  . Smoking status: Never Smoker  . Smokeless tobacco: Never Used  . Tobacco comment: tobacco use - no  Vaping Use  . Vaping Use: Never used  Substance and Sexual Activity  . Alcohol use: No    Alcohol/week: 0.0 standard drinks  . Drug use: No  . Sexual activity: Never  Other Topics Concern  . Not on file  Social History Narrative  . Not on file   Social Determinants of Health   Financial Resource Strain:   . Difficulty of Paying Living Expenses:   Food Insecurity:   . Worried About Charity fundraiser in  the Last Year:   . Arboriculturist in the Last Year:   Transportation Needs: No Transportation Needs  . Lack of Transportation (Medical): No  . Lack of Transportation (Non-Medical): No  Physical Activity:   . Days of Exercise per Week:   . Minutes of Exercise per Session:   Stress: No Stress Concern Present  . Feeling of Stress : Not at all  Social Connections: Socially Integrated  . Frequency of Communication with Friends and Family: Three times a week  . Frequency of Social Gatherings with Friends and Family: More than three times a week  .  Attends Religious Services: More than 4 times per year  . Active Member of Clubs or Organizations: Yes  . Attends Archivist Meetings: 1 to 4 times per year  . Marital Status: Married    Tobacco Counseling Counseling given: Not Answered Comment: tobacco use - no   Clinical Intake:  Pre-visit preparation completed: Yes        Diabetes: No  How often do you need to have someone help you when you read instructions, pamphlets, or other written materials from your doctor or pharmacy?: 1 - Never   Interpreter Needed?: No      Activities of Daily Living In your present state of health, do you have any difficulty performing the following activities: 07/12/2020  Hearing? N  Vision? N  Difficulty concentrating or making decisions? Y  Walking or climbing stairs? Y  Dressing or bathing? N  Doing errands, shopping? Y  Comment She does not drive alone. Husband accompanies her.  Preparing Food and eating ? N  Using the Toilet? N  In the past six months, have you accidently leaked urine? N  Do you have problems with loss of bowel control? N  Managing your Medications? N  Comment Packaged in daily doses  Managing your Finances? N  Housekeeping or managing your Housekeeping? N  Comment Husband assist as needed  Some recent data might be hidden    Patient Care Team: Einar Pheasant, MD as PCP - General (Internal  Medicine)  Indicate any recent Medical Services you may have received from other than [facility] providers in the past year (date may be approximate).     Assessment:   This is a routine wellness examination for Wanda Martinez.  I connected with Wanda Martinez today by telephone and verified that I am speaking with the correct person using two identifiers. Location patient: home Location provider: work Persons participating in the virtual visit: patient, Marine scientist.    I discussed the limitations, risks, security and privacy concerns of performing an evaluation and management service by telephone and the availability of in person appointments. The patient expressed understanding and verbally consented to this telephonic visit.    Interactive audio and video telecommunications were attempted between this provider and patient, however failed, due to patient having technical difficulties OR patient did not have access to video capability.  We continued and completed visit with audio only.  Some vital signs may be absent or patient reported.   Hearing/Vision screen  Hearing Screening   125Hz  250Hz  500Hz  1000Hz  2000Hz  3000Hz  4000Hz  6000Hz  8000Hz   Right ear:           Left ear:           Comments: Patient is able to hear conversational tones without difficulty.  No issues reported.   Vision Screening Comments: Wears corrective lenses Cataract extraction, bilateral Visual acuity not assessed, virtual visit.  They have seen their ophthalmologist in the last 12 months.     Dietary issues and exercise activities discussed: Current Exercise Habits: The patient does not participate in regular exercise at present, Type of exercise: walking, Intensity: MildHealthy diet; vegetables mostly Good fluid intake Caffeine- none Goals      Patient Stated   .  Increase physical activity (pt-stated)      Walk for exercise when the weather is cool Practice more brain stimulating activities (reading, new hobbies, painting etc)       Depression Screen PHQ 2/9 Scores 07/12/2020 07/07/2020 05/20/2020 03/18/2020 12/04/2019 11/20/2019 10/17/2019  PHQ - 2 Score 0  0 0 0 0 0 0  PHQ- 9 Score - - - - - - 0  Exception Documentation - - - - - - -    Fall Risk Fall Risk  07/12/2020 07/07/2020 05/20/2020 03/18/2020 12/04/2019  Falls in the past year? - 1 0 0 0  Number falls in past yr: - 0 - - -  Injury with Fall? - 0 - - -  Comment - - - - -  Risk Factor Category  - - - - -  Risk for fall due to : - History of fall(s) - - -  Risk for fall due to: Comment - - - - -  Follow up Falls evaluation completed - - - -    Handrails in use when climbing stairs? Yes  Home free of loose throw rugs in walkways, pet beds, electrical cords, etc? Yes  Adequate lighting in your home to reduce risk of falls? Yes   ASSISTIVE DEVICES UTILIZED TO PREVENT FALLS: Life alert? No  Use of a cane, walker or w/c? Yes  Grab bars in the bathroom? Yes  Shower chair or bench in shower? Yes  Elevated toilet seat or a handicapped toilet? Yes   TIMED UP AND GO:  Was the test performed? No . Virtual visit.   Cognitive Function: Followed by Neurology, Dr. Melrose Nakayama Taking aricept as directed  MMSE - Mini Mental State Exam 07/12/2020 07/10/2019 10/26/2016  Not completed: Unable to complete Refused -  Orientation to time - - 5  Orientation to Place - - 5  Registration - - 1  Attention/ Calculation - - 5  Recall - - 3  Language- name 2 objects - - 2  Language- repeat - - 1  Language- follow 3 step command - - 3  Language- read & follow direction - - 1  Write a sentence - - 1  Copy design - - 1  Total score - - 28     6CIT Screen 10/26/2016  What Year? 0 points  What month? 0 points  What time? 0 points  Count back from 20 0 points  Months in reverse 0 points    Immunizations Immunization History  Administered Date(s) Administered  . Fluad Quad(high Dose 65+) 09/01/2019  . Influenza Split 11/02/2014  . Influenza, High Dose Seasonal PF 10/02/2016,  08/23/2018  . Influenza,inj,Quad PF,6+ Mos 09/03/2015  . Influenza-Unspecified 09/22/2013, 11/06/2014, 09/03/2015, 10/02/2016  . Moderna SARS-COVID-2 Vaccination 01/29/2019, 02/27/2019  . Pneumococcal Polysaccharide-23 09/02/2018   TDAP status: Due, Education has been provided regarding the importance of this vaccine. Advised may receive this vaccine at local pharmacy or Health Dept. Aware to provide a copy of the vaccination record if obtained from local pharmacy or Health Dept. Verbalized acceptance and understanding.   Prevnar 13 - due. Patient aware she may receive at next scheduled office visit, health department or local pharmacy. Agrees to update immunization record if received out of the office.    Health Maintenance Health Maintenance  Topic Date Due  . TETANUS/TDAP  Never done  . PNA vac Low Risk Adult (2 of 2 - PCV13) 09/03/2019  . MAMMOGRAM  09/03/2035 (Originally 07/11/2013)  . INFLUENZA VACCINE  07/18/2020  . DEXA SCAN  Completed  . COVID-19 Vaccine  Completed   Dental Screening: Recommended annual dental exams for proper oral hygiene. Visits every 6 months.   Community Resource Referral / Chronic Care Management: CRR required this visit?  No   CCM required this visit?  No  Plan:   Keep all routine maintenance appointments.   Follow up 09/16/20 @ 1:30  I have personally reviewed and noted the following in the patient's chart:   . Medical and social history . Use of alcohol, tobacco or illicit drugs  . Current medications and supplements . Functional ability and status . Nutritional status . Physical activity . Advanced directives . List of other physicians . Hospitalizations, surgeries, and ER visits in previous 12 months . Vitals . Screenings to include cognitive, depression, and falls . Referrals and appointments  In addition, I have reviewed and discussed with patient certain preventive protocols, quality metrics, and best practice recommendations.  A written personalized care plan for preventive services as well as general preventive health recommendations were provided to patient via mail.     Varney Biles, LPN   [id number]/[id number]    Reviewed above information.  Agree with assessment and plan.    Dr Nicki Reaper

## 2020-07-12 NOTE — Patient Instructions (Addendum)
[name] , Thank you for taking time to come for your Medicare Wellness Visit. I appreciate your ongoing commitment to your health goals. Please review the following plan we discussed and let me know if I can assist you in the future.   These are the goals we discussed: Goals      Patient Stated     Increase physical activity (pt-stated)      Walk for exercise when the weather is cool Practice more brain stimulating activities (reading, new hobbies, painting etc)       This is a list of the screening recommended for you and due dates:  Health Maintenance  Topic Date Due   Tetanus Vaccine  Never done   Pneumonia vaccines (2 of 2 - PCV13) 09/03/2019   Mammogram  09/03/2035*   Flu Shot  07/18/2020   DEXA scan (bone density measurement)  Completed   COVID-19 Vaccine  Completed  *Topic was postponed. The date shown is not the original due date.    Immunizations Immunization History  Administered Date(s) Administered   Fluad Quad(high Dose 65+) 09/01/2019   Influenza Split 11/02/2014   Influenza, High Dose Seasonal PF 10/02/2016, 08/23/2018   Influenza,inj,Quad PF,6+ Mos 09/03/2015   Influenza-Unspecified 09/22/2013, 11/06/2014, 09/03/2015, 10/02/2016   Moderna SARS-COVID-2 Vaccination 01/29/2019, 02/27/2019   Pneumococcal Polysaccharide-23 09/02/2018   Keep all routine maintenance appointments.   Follow up 09/16/20 @ 1:30  Advanced directives: declined   Conditions/risks identified: none new  Follow up in one year for your annual wellness visit.   Preventive Care 68 Years and Older, Female Preventive care refers to lifestyle choices and visits with your health care provider that can promote health and wellness. What does preventive care include?  A yearly physical exam. This is also called an annual well check.  Dental exams once or twice a year.  Routine eye exams. Ask your health care provider how often you should have your eyes  checked.  Personal lifestyle choices, including:  Daily care of your teeth and gums.  Regular physical activity.  Eating a healthy diet.  Avoiding tobacco and drug use.  Limiting alcohol use.  Practicing safe sex.  Taking low-dose aspirin every day.  Taking vitamin and mineral supplements as recommended by your health care provider. What happens during an annual well check? The services and screenings done by your health care provider during your annual well check will depend on your age, overall health, lifestyle risk factors, and family history of disease. Counseling  Your health care provider may ask you questions about your:  Alcohol use.  Tobacco use.  Drug use.  Emotional well-being.  Home and relationship well-being.  Sexual activity.  Eating habits.  History of falls.  Memory and ability to understand (cognition).  Work and work Statistician.  Reproductive health. Screening  You may have the following tests or measurements:  Height, weight, and BMI.  Blood pressure.  Lipid and cholesterol levels. These may be checked every 5 years, or more frequently if you are over 62 years old.  Skin check.  Lung cancer screening. You may have this screening every year starting at age 38 if you have a 30-pack-year history of smoking and currently smoke or have quit within the past 15 years.  Fecal occult blood test (FOBT) of the stool. You may have this test every year starting at age 36.  Flexible sigmoidoscopy or colonoscopy. You may have a sigmoidoscopy every 5 years or a colonoscopy every 10 years starting at age  50.  Hepatitis C blood test.  Hepatitis B blood test.  Sexually transmitted disease (STD) testing.  Diabetes screening. This is done by checking your blood sugar (glucose) after you have not eaten for a while (fasting). You may have this done every 1-3 years.  Bone density scan. This is done to screen for osteoporosis. You may have this done  starting at age 36.  Mammogram. This may be done every 1-2 years. Talk to your health care provider about how often you should have regular mammograms. Talk with your health care provider about your test results, treatment options, and if necessary, the need for more tests. Vaccines  Your health care provider may recommend certain vaccines, such as:  Influenza vaccine. This is recommended every year.  Tetanus, diphtheria, and acellular pertussis (Tdap, Td) vaccine. You may need a Td booster every 10 years.  Zoster vaccine. You may need this after age 23.  Pneumococcal 13-valent conjugate (PCV13) vaccine. One dose is recommended after age 63.  Pneumococcal polysaccharide (PPSV23) vaccine. One dose is recommended after age 26. Talk to your health care provider about which screenings and vaccines you need and how often you need them. This information is not intended to replace advice given to you by your health care provider. Make sure you discuss any questions you have with your health care provider. Document Released: 12/31/2015 Document Revised: 08/23/2016 Document Reviewed: 10/05/2015 Elsevier Interactive Patient Education  2017 [employer] Prevention in the Home Falls can cause injuries. They can happen to people of all ages. There are many things you can do to make your home safe and to help prevent falls. What can I do on the outside of my home?  Regularly fix the edges of walkways and driveways and fix any cracks.  Remove anything that might make you trip as you walk through a door, such as a raised step or threshold.  Trim any bushes or trees on the path to your home.  Use bright outdoor lighting.  Clear any walking paths of anything that might make someone trip, such as rocks or tools.  Regularly check to see if handrails are loose or broken. Make sure that both sides of any steps have handrails.  Any raised decks and porches should have guardrails on the  edges.  Have any leaves, snow, or ice cleared regularly.  Use sand or salt on walking paths during winter.  Clean up any spills in your garage right away. This includes oil or grease spills. What can I do in the bathroom?  Use night lights.  Install grab bars by the toilet and in the tub and shower. Do not use towel bars as grab bars.  Use non-skid mats or decals in the tub or shower.  If you need to sit down in the shower, use a plastic, non-slip stool.  Keep the floor dry. Clean up any water that spills on the floor as soon as it happens.  Remove soap buildup in the tub or shower regularly.  Attach bath mats securely with double-sided non-slip rug tape.  Do not have throw rugs and other things on the floor that can make you trip. What can I do in the bedroom?  Use night lights.  Make sure that you have a light by your bed that is easy to reach.  Do not use any sheets or blankets that are too big for your bed. They should not hang down onto the floor.  Have a firm chair that  has side arms. You can use this for support while you get dressed.  Do not have throw rugs and other things on the floor that can make you trip. What can I do in the kitchen?  Clean up any spills right away.  Avoid walking on wet floors.  Keep items that you use a lot in easy-to-reach places.  If you need to reach something above you, use a strong step stool that has a grab bar.  Keep electrical cords out of the way.  Do not use floor polish or wax that makes floors slippery. If you must use wax, use non-skid floor wax.  Do not have throw rugs and other things on the floor that can make you trip. What can I do with my stairs?  Do not leave any items on the stairs.  Make sure that there are handrails on both sides of the stairs and use them. Fix handrails that are broken or loose. Make sure that handrails are as long as the stairways.  Check any carpeting to make sure that it is firmly  attached to the stairs. Fix any carpet that is loose or worn.  Avoid having throw rugs at the top or bottom of the stairs. If you do have throw rugs, attach them to the floor with carpet tape.  Make sure that you have a light switch at the top of the stairs and the bottom of the stairs. If you do not have them, ask someone to add them for you. What else can I do to help prevent falls?  Wear shoes that:  Do not have high heels.  Have rubber bottoms.  Are comfortable and fit you well.  Are closed at the toe. Do not wear sandals.  If you use a stepladder:  Make sure that it is fully opened. Do not climb a closed stepladder.  Make sure that both sides of the stepladder are locked into place.  Ask someone to hold it for you, if possible.  Clearly mark and make sure that you can see:  Any grab bars or handrails.  First and last steps.  Where the edge of each step is.  Use tools that help you move around (mobility aids) if they are needed. These include:  Canes.  Walkers.  Scooters.  Crutches.  Turn on the lights when you go into a dark area. Replace any light bulbs as soon as they burn out.  Set up your furniture so you have a clear path. Avoid moving your furniture around.  If any of your floors are uneven, fix them.  If there are any pets around you, be aware of where they are.  Review your medicines with your doctor. Some medicines can make you feel dizzy. This can increase your chance of falling. Ask your doctor what other things that you can do to help prevent falls. This information is not intended to replace advice given to you by your health care provider. Make sure you discuss any questions you have with your health care provider. Document Released: 09/30/2009 Document Revised: 05/11/2016 Document Reviewed: 01/08/2015 Elsevier Interactive Patient Education  2017 [employer].

## 2020-07-14 DIAGNOSIS — I480 Paroxysmal atrial fibrillation: Secondary | ICD-10-CM | POA: Diagnosis not present

## 2020-07-14 DIAGNOSIS — R000 Tachycardia, unspecified: Secondary | ICD-10-CM | POA: Diagnosis not present

## 2020-07-14 DIAGNOSIS — R0602 Shortness of breath: Secondary | ICD-10-CM | POA: Diagnosis not present

## 2020-07-14 DIAGNOSIS — G20 Parkinson's disease: Secondary | ICD-10-CM | POA: Diagnosis not present

## 2020-07-14 DIAGNOSIS — I10 Essential (primary) hypertension: Secondary | ICD-10-CM | POA: Diagnosis not present

## 2020-07-14 DIAGNOSIS — N182 Chronic kidney disease, stage 2 (mild): Secondary | ICD-10-CM | POA: Diagnosis not present

## 2020-07-16 ENCOUNTER — Other Ambulatory Visit: Payer: Self-pay | Admitting: Internal Medicine

## 2020-07-16 DIAGNOSIS — M7918 Myalgia, other site: Secondary | ICD-10-CM

## 2020-07-20 DIAGNOSIS — R000 Tachycardia, unspecified: Secondary | ICD-10-CM | POA: Diagnosis not present

## 2020-07-23 ENCOUNTER — Other Ambulatory Visit: Payer: Self-pay | Admitting: *Deleted

## 2020-07-23 DIAGNOSIS — C50211 Malignant neoplasm of upper-inner quadrant of right female breast: Secondary | ICD-10-CM

## 2020-07-23 NOTE — Progress Notes (Signed)
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  INTERVAL HISTORY: Patient returns to clinic today for routine 49-monthevaluation and continuation of Prolia. She continues to feel well and is at her baseline. She has no new neurologic complaints.  She denies any recent fevers or illnesses.  She has a good appetite and denies weight loss.  She denies any chest pain, shortness of breath, cough, or hemoptysis.  She denies any nausea, vomiting, constipation, or diarrhea.  She has no urinary complaints. Patient offers no specific complaints today.  REVIEW OF SYSTEMS:   Review of Systems  Constitutional: Negative.  Negative for fever, malaise/fatigue and weight loss.  Respiratory: Negative.  Negative for cough and shortness of breath.   Cardiovascular: Negative.  Negative for chest pain and leg swelling.  Gastrointestinal: Negative.  Negative for abdominal pain.  Genitourinary: Negative.   Musculoskeletal: Negative.  Negative for back pain and joint pain.  Skin: Negative.  Negative for rash.  Neurological: Positive for tremors. Negative for sensory change, speech change, focal weakness and weakness.  Psychiatric/Behavioral: Negative.  Negative for depression. The patient is not nervous/anxious.     As per HPI. Otherwise, a complete review of systems is negative.  PAST MEDICAL HISTORY: Past Medical History:  Diagnosis Date  . Acute postoperative pain 10/25/2017  . Anemia   . Arm pain 07/26/2015  . Arthritis   . Arthritis, degenerative 03/26/2014  . Back pain 11/01/2013  . Bladder infection 06/2018  . [place]    Masectomy - left - 1986   . Breast cancer ([facility]     Mastectomy-right -2014  . CHEST PAIN 04/29/2010   Qualifier: Diagnosis of  By: JWynetta EmeryRN, EDoroteo Bradford   . Chronic cystitis   . Cystocele 02/22/2013  . Cystocele, midline 08/19/2013  . Degeneration of intervertebral disc of lumbosacral region 03/26/2014  . DYSPNEA 04/29/2010   Qualifier: Diagnosis of  By: JWynetta EmeryRN, EDoroteo Bradford   . Enthesopathy of hip 03/26/2014  . GERD (gastroesophageal reflux disease)   . Hiatal hernia   . HTN (hypertension)   . Hypokalemia 06/2018  . Hyponatremia 06/2018  . LBP (low back pain) 03/26/2014  . Neck pain 11/01/2013  . Parkinson disease ([place]   . Pneumonia 06/2018  . Sinusitis 02/07/2015  . Skin lesions 07/12/2014  . Urinary incontinence    mixed     PAST SURGICAL HISTORY: Past Surgical History:  Procedure Laterality Date  . ABDOMINAL HYSTERECTOMY    . BREAST BIOPSY  2013  . BREAST ENHANCEMENT SURGERY  1987  . BREAST IMPLANT REMOVAL    . BREAST IMPLANT REMOVAL Right 08/29/2012  . BREAST SURGERY  1986   s/p left mastectomy  . COLONOSCOPY WITH PROPOFOL N/A 09/13/2016   Procedure: COLONOSCOPY WITH PROPOFOL;  Surgeon: RManya Silvas MD;  [facility]  Service: Endoscopy;  Laterality: N/A;  . MASTECTOMY  08/2012   right  . TONSILLECTOMY AND ADENOIDECTOMY  79  . VESICOVAGINAL FISTULA CLOSURE W/ TAH  1983    FAMILY HISTORY Family History  Problem Relation Age of Onset  . Diabetes Father   . Stroke Father   . Colon polyps Father   . Stroke Mother   . Parkinson's disease Mother  ADVANCED DIRECTIVES:    HEALTH MAINTENANCE: Social History   Tobacco Use  . Smoking status: Never Smoker  . Smokeless tobacco: Never Used  . Tobacco comment: tobacco use - no  Vaping Use  . Vaping Use: Never used  Substance Use Topics  . Alcohol use: No    Alcohol/week: 0.0 standard drinks  . Drug use: No     Allergies  Allergen Reactions  . Sulfa Antibiotics Nausea Only  . Vesicare [Solifenacin] Other (See Comments)    Constipation Constipation     Current Outpatient Medications  Medication Sig Dispense Refill  . amLODipine (NORVASC) 2.5 MG tablet TAKE 1 TABLET BY MOUTH ONCE DAILY. 90 tablet 3  . aspirin 81 MG EC tablet Take 81 mg by mouth daily as needed.      . Calcium Carbonate (CALCIUM 600) 1500 MG TABS Take 1 tablet by mouth daily.      . citalopram (CELEXA) 20 MG tablet TAKE 1 & 1/2 TABLETS BY MOUTH ONCE DAILY    . cyclobenzaprine (FLEXERIL) 10 MG tablet TAKE ONE TABLET BY MOUTH AT BEDTIME. 30 tablet 11  . donepezil (ARICEPT) 5 MG tablet     . gabapentin (NEURONTIN) 100 MG capsule Take 2 capsules (200 mg total) by mouth 2 (two) times daily AND 3-4 capsules (300-400 mg total) at bedtime. 240 capsule 2  . GLUCOSAMINE SULFATE PO Take by mouth daily.     . hydrochlorothiazide (HYDRODIURIL) 25 MG tablet TAKE 1 TABLET BY MOUTH ONCE DAILY. (PATIENT TAKES 1/2 ONCE DAILY AND EXTRA 1/2 IF NEEDED) 15 tablet 11  . letrozole (FEMARA) 2.5 MG tablet Take 2.5 mg by mouth daily.    Marland Kitchen losartan (COZAAR) 50 MG tablet TAKE (2) TABLETS BY MOUTH ONCE DAILY. 180 tablet 3  . lubiprostone (AMITIZA) 24 MCG capsule TAKE 1 CAPSULE BY MOUTH ONCE DAILY WITH BREAKFAST.    . magnesium oxide (MAG-OX) 400 (241.3 Mg) MG tablet TAKE 1 TABLET BY MOUTH ONCE DAILY. 30 tablet 11  . meloxicam (MOBIC) 7.5 MG tablet TAKE 1 TABLET BY MOUTH ONCE DAILY. 30 tablet 4  . metoprolol succinate (TOPROL-XL) 25 MG 24 hr tablet TAKE 1 TABLET BY MOUTH TWICE DAILY 60 tablet 11  . Multiple Vitamin (MULTIVITAMIN) tablet Take 1 tablet by mouth daily.    Marland Kitchen nystatin (MYCOSTATIN) 100000 UNIT/ML suspension Swish and spit tid 60 mL 0  . Omega-3 1000 MG CAPS Take by mouth daily.     Marland Kitchen omeprazole (PRILOSEC) 20 MG capsule TAKE 1 CAPSULE BY MOUTH TWICE DAILY FOR REFLUX. 60 capsule 1  . pantoprazole (PROTONIX) 40 MG tablet TAKE 1 TABLET BY MOUTH ONCE DAILY. 90 tablet 3  . pravastatin (PRAVACHOL) 20 MG tablet TAKE 1 TABLET BY MOUTH ONCE DAILY. 90 tablet 3  . HYDROcodone-acetaminophen  (NORCO/VICODIN) 5-325 MG tablet Take 1 tablet by mouth every 8 (eight) hours as needed for severe pain. Must last 30 days 90 tablet 0  . [START ON 08/25/2020] HYDROcodone-acetaminophen (NORCO/VICODIN) 5-325 MG tablet Take 1 tablet by mouth every 8 (eight) hours as needed for severe pain. Must last 30 days 90 tablet 0  . [START ON 09/24/2020] HYDROcodone-acetaminophen (NORCO/VICODIN) 5-325 MG tablet Take 1 tablet by mouth every 8 (eight) hours as needed for severe pain. Must last 30 days 90 tablet 0   No current facility-administered medications for this visit.    OBJECTIVE: Vitals:   07/27/20 1036  BP: (!) 143/75  Pulse: 61  Temp: 98.5 F (36.9 C)  SpO2: 100%  Body mass index is 31.86 kg/m.    ECOG FS:0 - Asymptomatic  General: Well-developed, well-nourished, no acute distress. Eyes: Pink conjunctiva, anicteric sclera. HEENT: Normocephalic, moist mucous membranes. Lungs: No audible wheezing or coughing. Heart: Regular rate and rhythm. Abdomen: Soft, nontender, no obvious distention. Musculoskeletal: No edema, cyanosis, or clubbing. Neuro: Alert, answering all questions appropriately. Cranial nerves grossly intact. Skin: No rashes or petechiae noted. Psych: Normal affect.  LAB RESULTS:  Lab Results  Component Value Date   NA 137 07/27/2020   K 3.8 07/27/2020   CL 99 07/27/2020   CO2 28 07/27/2020   GLUCOSE 90 07/27/2020   BUN 18 07/27/2020   CREATININE 0.99 07/27/2020   CALCIUM 9.4 07/27/2020   PROT 6.8 06/11/2020   ALBUMIN 4.4 10/17/2019   AST 20 06/11/2020   ALT 19 06/11/2020   ALKPHOS 50 10/17/2019   BILITOT 0.4 06/11/2020   GFRNONAA 53 (L) 07/27/2020   GFRAA >60 07/27/2020    Lab Results  Component Value Date   WBC 6.6 06/11/2020   NEUTROABS 4,336 06/11/2020   HGB 11.3 (L) 06/11/2020   HCT 34.5 (L) 06/11/2020   MCV 90.1 06/11/2020   PLT 213 06/11/2020     STUDIES: DG Bone Density  Result Date: 07/02/2020 EXAM: DUAL X-RAY ABSORPTIOMETRY (DXA) FOR  BONE MINERAL DENSITY IMPRESSION: Your patient Mikeria Valin completed a BMD test on 07/01/2020 using the [place] (software version: 14.10) manufactured by [employer]. The following summarizes the results of our evaluation. Technologist: [facility] PATIENT BIOGRAPHICAL: Name: Claris, Pech Patient ID: [id number] Birth Date: 03-Jun-1935 Height: 63.0 in. Gender: Female Exam Date: 07/01/2020 Weight: 190.9 lbs. Indications: High Risk Meds, Advanced Age, Osteoarthritis, Postmenopausal, Hysterectomy, Breast CA, Caucasian, History of Breast Cancer, Height Loss Fractures: Treatments: Calcium, Gabapentin, Multi-Vitamin, Omeprazole, Protonix, Vitamin D DENSITOMETRY RESULTS: Site         Region     Measured Date Measured Age WHO Classification Young Adult T-score BMD         %Change vs. Previous Significant Change (*) DualFemur Neck Left 07/01/2020 82.7 Osteopenia -2.4 0.702 g/cm2 7.5% - DualFemur Neck Left 07/01/2019 81.7 Osteoporosis -2.8 0.653 g/cm2 -6.6% - DualFemur Neck Left 07/10/2018 80.7 Osteopenia -2.4 0.699 g/cm2 -2.6% - DualFemur Neck Left 06/25/2017 79.6 Osteopenia -2.3 0.718 g/cm2 0.8% - DualFemur Neck Left 06/21/2016 78.6 Osteopenia -2.3 0.712 g/cm2 -9.6% Yes DualFemur Neck Left 06/15/2015 77.6 Osteopenia -1.8 0.788 g/cm2 3.1% - DualFemur Neck Left 08/21/2013 75.8 Osteopenia -2.0 0.764 g/cm2 - - DualFemur Total Mean 07/01/2020 82.7 Osteopenia -2.1 0.748 g/cm2 8.7% Yes DualFemur Total Mean 07/01/2019 81.7 Osteoporosis -2.5 0.688 g/cm2 -7.4% Yes DualFemur Total Mean 07/10/2018 80.7 Osteopenia -2.1 0.743 g/cm2 1.2% - DualFemur Total Mean 06/25/2017 79.6 Osteopenia -2.2 0.734 g/cm2 -1.1% - DualFemur Total Mean 06/21/2016 78.6 Osteopenia -2.1 0.742 g/cm2 -1.9% - DualFemur Total Mean 06/15/2015 77.6 Osteopenia -2.0 0.756 g/cm2 -4.2% Yes DualFemur Total Mean 08/21/2013 75.8 Osteopenia -1.7 0.789 g/cm2 - - Left Forearm Radius 33% 07/01/2020 82.7 Osteoporosis -2.9 0.624 g/cm2 1.5% - Left Forearm Radius  33% 07/01/2019 81.7 Osteoporosis -3.0 0.615 g/cm2 -4.9% - Left Forearm Radius 33% 07/10/2018 80.7 Osteoporosis -2.6 0.647 g/cm2 -6.9% Yes Left Forearm Radius 33% 06/25/2017 79.6 Osteopenia -2.1 0.695 g/cm2 -2.4% - Left Forearm Radius 33% 06/21/2016 78.6 Osteopenia -1.9 0.712 g/cm2 11.6% Yes Left Forearm Radius 33% 06/15/2015 77.6 Osteoporosis -2.7 0.638 g/cm2 -11.9% Yes Left Forearm Radius 33% 08/21/2013 75.8 Osteopenia -1.7 0.724 g/cm2 - - ASSESSMENT: The BMD measured at Forearm Radius 33% is  0.624 g/cm2 with a T-score of -2.9. This patient is considered osteoporotic according to [facility]) criteria. Lumbar spine was not utilized due to advanced degenerative changes. The scan quality is good. Compared with prior study, there has been significant increase in the total hip. World Pharmacologist [facility] criteria for post-menopausal, Caucasian Women: Normal:                   T-score at or above -1 SD Osteopenia/low bone mass: T-score between -1 and -2.5 SD Osteoporosis:             T-score at or below -2.5 SD RECOMMENDATIONS: 1. All patients should optimize calcium and vitamin D intake. 2. Consider FDA-approved medical therapies in postmenopausal women and men aged 37 years and older, based on the following: a. A hip or vertebral(clinical or morphometric) fracture b. T-score < -2.5 at the femoral neck or spine after appropriate evaluation to exclude secondary causes c. Low bone mass (T-score between -1.0 and -2.5 at the femoral neck or spine) and a 10-year probability of a hip fracture > 3% or a 10-year probability of a major osteoporosis-related fracture > 20% based on the [place]-adapted WHO algorithm 3. Clinician judgment and/or patient preferences may indicate treatment for people with 10-year fracture probabilities above or below these levels FOLLOW-UP: People with diagnosed cases of osteoporosis or at high risk for fracture should have regular bone mineral density tests. For patients eligible  for Medicare, routine testing is allowed once every 2 years. The testing frequency can be increased to one year for patients who have rapidly progressing disease, those who are receiving or discontinuing medical therapy to restore bone mass, or have additional risk factors. I have reviewed this report, and agree with the above findings. [facility] Electronically Signed   By: Kerby Moors M.D.   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  PLAN:    1.  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  Patient completed 5 years of letrozole in May 2019.  No further interventions are needed.  Patient has had bilateral mastectomies, therefore no further mammograms are necessary.  Return to clinic as scheduled for Prolia. 2.  Osteoporosis: Patient's most recent bone mineral density on July 01, 2020 revealed a T score of -2.9 which is essentially unchanged from 1 year prior where the T score was reported -3.0. Repeat in July 2022. Proceed with Prolia today. Continue calcium and vitamin D supplementation. Return to clinic in 6 months with repeat laboratory work, further evaluation, and continuation of treatment. 3.  Tremors/Parkinson's: Chronic and unchanged. Treatment per neurology.   Patient expressed understanding and was in agreement with this plan. She also understands that She can call clinic at any time with any questions, concerns, or complaints.   Breast cancer [facility])   Staging form: Breast, AJCC 7th Edition     Clinical stage from 09/22/2015: Stage IIA (T1c, N1, M0) - Signed by Lloyd Huger, MD on 09/22/2015   Lloyd Huger, MD   07/27/2020 12:02 PM

## 2020-07-27 ENCOUNTER — Other Ambulatory Visit: Payer: Self-pay | Admitting: Oncology

## 2020-07-27 ENCOUNTER — Inpatient Hospital Stay (HOSPITAL_BASED_OUTPATIENT_CLINIC_OR_DEPARTMENT_OTHER): Payer: Medicare Other | Admitting: Oncology

## 2020-07-27 ENCOUNTER — Inpatient Hospital Stay: Payer: Medicare Other

## 2020-07-27 ENCOUNTER — Encounter: Payer: Self-pay | Admitting: Oncology

## 2020-07-27 ENCOUNTER — Inpatient Hospital Stay: Payer: Medicare Other | Attending: Oncology

## 2020-07-27 ENCOUNTER — Other Ambulatory Visit: Payer: Self-pay

## 2020-07-27 VITALS — BP 143/75 | HR 61 | Temp 98.5°F | Wt 185.6 lb

## 2020-07-27 DIAGNOSIS — Z7982 Long term (current) use of aspirin: Secondary | ICD-10-CM | POA: Insufficient documentation

## 2020-07-27 DIAGNOSIS — G20 Parkinson's disease: Secondary | ICD-10-CM | POA: Insufficient documentation

## 2020-07-27 DIAGNOSIS — M818 Other osteoporosis without current pathological fracture: Secondary | ICD-10-CM

## 2020-07-27 DIAGNOSIS — Z853 Personal history of malignant neoplasm of breast: Secondary | ICD-10-CM | POA: Diagnosis not present

## 2020-07-27 DIAGNOSIS — I10 Essential (primary) hypertension: Secondary | ICD-10-CM | POA: Insufficient documentation

## 2020-07-27 DIAGNOSIS — Z79899 Other long term (current) drug therapy: Secondary | ICD-10-CM | POA: Insufficient documentation

## 2020-07-27 DIAGNOSIS — Z9013 Acquired absence of bilateral breasts and nipples: Secondary | ICD-10-CM | POA: Diagnosis not present

## 2020-07-27 DIAGNOSIS — M810 Age-related osteoporosis without current pathological fracture: Secondary | ICD-10-CM | POA: Insufficient documentation

## 2020-07-27 DIAGNOSIS — K219 Gastro-esophageal reflux disease without esophagitis: Secondary | ICD-10-CM | POA: Diagnosis not present

## 2020-07-27 DIAGNOSIS — C50211 Malignant neoplasm of upper-inner quadrant of right female breast: Secondary | ICD-10-CM

## 2020-07-27 DIAGNOSIS — Z90710 Acquired absence of both cervix and uterus: Secondary | ICD-10-CM | POA: Insufficient documentation

## 2020-07-27 DIAGNOSIS — Z79811 Long term (current) use of aromatase inhibitors: Secondary | ICD-10-CM | POA: Diagnosis not present

## 2020-07-27 DIAGNOSIS — Z170 Estrogen receptor positive status [ER+]: Secondary | ICD-10-CM | POA: Diagnosis not present

## 2020-07-27 LAB — BASIC METABOLIC PANEL
Anion gap: 10 (ref 5–15)
BUN: 18 mg/dL (ref 8–23)
CO2: 28 mmol/L (ref 22–32)
Calcium: 9.4 mg/dL (ref 8.9–10.3)
Chloride: 99 mmol/L (ref 98–111)
Creatinine, Ser: 0.99 mg/dL (ref 0.44–1.00)
GFR calc Af Amer: >60 mL/min (ref >60)
GFR calc non Af Amer: 53 mL/min — ABNORMAL LOW (ref >60)
Glucose, Bld: 90 mg/dL (ref 70–99)
Potassium: 3.8 mmol/L (ref 3.5–5.1)
Sodium: 137 mmol/L (ref 135–145)

## 2020-07-27 MED ORDER — DENOSUMAB 60 MG/ML ~~LOC~~ SOSY
60.0000 mg | PREFILLED_SYRINGE | Freq: Once | SUBCUTANEOUS | Status: AC
  Administered 2020-07-27: 60 mg via SUBCUTANEOUS
  Filled 2020-07-27: qty 1

## 2020-07-27 NOTE — Progress Notes (Signed)
Pt in for follow up, denies any concerns today. 

## 2020-08-10 DIAGNOSIS — R002 Palpitations: Secondary | ICD-10-CM | POA: Diagnosis not present

## 2020-08-12 DIAGNOSIS — R251 Tremor, unspecified: Secondary | ICD-10-CM | POA: Diagnosis not present

## 2020-08-12 DIAGNOSIS — M25511 Pain in right shoulder: Secondary | ICD-10-CM | POA: Diagnosis not present

## 2020-08-12 DIAGNOSIS — R413 Other amnesia: Secondary | ICD-10-CM | POA: Diagnosis not present

## 2020-08-12 DIAGNOSIS — F329 Major depressive disorder, single episode, unspecified: Secondary | ICD-10-CM | POA: Diagnosis not present

## 2020-08-12 DIAGNOSIS — R29898 Other symptoms and signs involving the musculoskeletal system: Secondary | ICD-10-CM | POA: Diagnosis not present

## 2020-08-12 DIAGNOSIS — M545 Low back pain: Secondary | ICD-10-CM | POA: Diagnosis not present

## 2020-08-12 DIAGNOSIS — G8929 Other chronic pain: Secondary | ICD-10-CM | POA: Diagnosis not present

## 2020-08-12 DIAGNOSIS — M542 Cervicalgia: Secondary | ICD-10-CM | POA: Diagnosis not present

## 2020-08-20 DIAGNOSIS — R0602 Shortness of breath: Secondary | ICD-10-CM | POA: Diagnosis not present

## 2020-08-25 DIAGNOSIS — M25562 Pain in left knee: Secondary | ICD-10-CM | POA: Diagnosis not present

## 2020-08-26 DIAGNOSIS — I10 Essential (primary) hypertension: Secondary | ICD-10-CM | POA: Diagnosis not present

## 2020-08-26 DIAGNOSIS — E7800 Pure hypercholesterolemia, unspecified: Secondary | ICD-10-CM | POA: Diagnosis not present

## 2020-08-26 DIAGNOSIS — R251 Tremor, unspecified: Secondary | ICD-10-CM | POA: Diagnosis not present

## 2020-08-26 DIAGNOSIS — R0602 Shortness of breath: Secondary | ICD-10-CM | POA: Diagnosis not present

## 2020-08-26 DIAGNOSIS — G20 Parkinson's disease: Secondary | ICD-10-CM | POA: Diagnosis not present

## 2020-08-26 DIAGNOSIS — N182 Chronic kidney disease, stage 2 (mild): Secondary | ICD-10-CM | POA: Diagnosis not present

## 2020-08-26 DIAGNOSIS — R002 Palpitations: Secondary | ICD-10-CM | POA: Insufficient documentation

## 2020-09-14 ENCOUNTER — Other Ambulatory Visit: Payer: Self-pay | Admitting: Internal Medicine

## 2020-09-16 ENCOUNTER — Ambulatory Visit: Payer: Medicare Other | Admitting: Internal Medicine

## 2020-09-26 NOTE — Progress Notes (Signed)
PROVIDER NOTE: Information contained herein reflects review and annotations entered in association with encounter. Interpretation of such information and data should be left to medically-trained personnel. Information provided to patient can be located elsewhere in the medical record under "Patient Instructions". Document created using STT-dictation technology, any transcriptional errors that may result from process are unintentional.    Patient: Wanda Martinez  Service Category: E/M  Provider: Gaspar Cola, MD  DOB: [birth year]  DOS: 09/27/2020  Specialty: Interventional Pain Management  MRN: [id number]  Setting: Ambulatory outpatient  PCP: Einar Pheasant, MD  Type: Established Patient    Referring Provider: Einar Pheasant, MD  Location: Office  Delivery: Face-to-face     HPI  Wanda Martinez, a 85 y.o. year old female, is here today because of her Chronic pain syndrome [G89.4]. Wanda Martinez primary complain today is Back Pain (lumbar mainly on the right ) Last encounter: My last encounter with her was on 07/07/2020. Pertinent problems: Wanda Martinez has Primary cancer of upper inner quadrant of right female breast ([place]); Headache; Parkinson's disease ([place]); Chronic low back pain (Primary Area of Pain) (Bilateral) (R>L); Lumbar spondylosis; Chronic hip pain (Bilateral); Chronic neck pain; Cervical spondylosis; Chronic cervical radicular pain (Right); Diffuse myofascial pain syndrome; Neurogenic pain; Chronic upper back pain (Right); Myofascial pain syndrome (Right) (cervicothoracic); Lumbar facet syndrome (Bilateral) (R>L); Malignant neoplasm of right female breast ([place]); Cervical facet hypertrophy; Cervical facet syndrome ([place]); Chronic shoulder pain (Right); Chronic pain syndrome; Chronic sacroiliac joint pain (Left); Chronic sacroiliac joint pain (Right); 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, L4-5, L5-S1) (Right); Lumbar spinal stenosis (with neurogenic claudication) (L3-4); Chronic  lower extremity pain (Secondary Area of Pain) (Right); Chronic lumbar radicular pain (S1) (Right); Trochanteric bursitis of hip (Bilateral); Spondylosis without myelopathy or radiculopathy, lumbosacral region; Trigger point with back pain (Right); DDD (degenerative disc disease), lumbosacral; Chronic upper extremity pain (Right); Chronic thoracic back pain (Bilateral) (L>R); Trigger point of thoracic region (Bilateral) (L>R); Other specified dorsopathies, sacral and sacrococcygeal region; Sacroiliac joint dysfunction (Right); Osteoarthritis of sacroiliac joint (Right); Somatic dysfunction of sacroiliac joint (Right); Chronic musculoskeletal pain; Facial pain; Chronic hip pain (Right); Osteoarthritis of hip (Right); Left ear pain; 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 Pain Assessment: Severity of Chronic pain is reported as a 6 /10. Location: Back Lower, Right/down right leg. Onset: More than a month ago. Quality: Discomfort, Constant, Aching. Timing: Constant. Modifying factor(s): medications, laying down on her left side with pillows for support and heating pad to back.  aspercreme.. Vitals:  height is 5' 4" (1.626 m) and weight is 180 lb (81.6 kg). Her temporal temperature is 97.3 F (36.3 C) (abnormal). Her blood pressure is 133/76 and her pulse is 58 (abnormal). Her respiration is 16 and oxygen saturation is 100%.   Reason for encounter: medication management.  The patient indicates doing well with the current medication regimen. No adverse reactions or side effects reported to the medications.  RTCB: 01/22/2021 Transfer nonopioids: Gabapentin (Wanda Martinez) 100 mg capsule, 2 capsules (200 mg) twice daily + 3-4 caps (300 to 400 mg) at bedtime (240/month)  Pharmacotherapy Assessment   Analgesic: Hydrocodone/APAP 5/325, 1 tab PO q 8 hrs  (15 mg/day of hydrocodone) MME/day:67m/day.   Monitoring: Wanda Martinez: PDMP reviewed during this encounter.       Pharmacotherapy: No side-effects or adverse reactions reported. Compliance: No problems identified. Effectiveness: Clinically acceptable.  Wanda Billow RN  09/27/2020  2:45 PM  Sign when Signing Visit Nursing Pain Medication Assessment:  Safety precautions to be maintained throughout the outpatient stay will include: orient to surroundings, keep bed in low position, maintain call bell within reach at all times, provide assistance with transfer out of bed and ambulation.  Medication Inspection Compliance: [name] did not comply with our request to bring her pills to be counted. She was reminded that bringing the medication bottles, even when empty, is a requirement.  Medication: None brought in. Pill/Patch Count: None available to be counted. Bottle Appearance: No container available. Did not bring bottle(s) to appointment. Filled Date: N/A Last Medication intake:  Today    UDS:  Summary  Date Value Ref Range Status  07/07/2020 Note  Final    Comment:    ==================================================================== [employer] [facility] (MW) ==================================================================== Test                             Result       Flag       Units  Drug Present and Declared for Prescription Verification   Hydrocodone                    389          EXPECTED   ng/mg creat   Dihydrocodeine                 131          EXPECTED   ng/mg creat   Norhydrocodone                 1443         EXPECTED   ng/mg creat    Sources of hydrocodone include scheduled prescription medications.    Dihydrocodeine and norhydrocodone are expected metabolites of    hydrocodone. Dihydrocodeine is also available as a scheduled    prescription medication.  ==================================================================== Test                       Result    Flag   Units      Ref Range   Creatinine              102              mg/dL      >=20 ==================================================================== Declared Medications:  The flagging and interpretation on this report are based on the  following declared medications.  Unexpected results may arise from  inaccuracies in the declared medications.   **Note: The testing scope of this panel includes these medications:   Hydrocodone (Norco)   **Note: The testing scope of this panel does not include the  following reported medications:   Acetaminophen (Norco)  Amlodipine (Norvasc)  Aspirin  Calcium  Citalopram (Celexa)  Cyclobenzaprine (Flexeril)  Donepezil (Aricept)  Gabapentin (Wanda Martinez)  Hydrochlorothiazide (Hydrodiuril)  Letrozole (Femara)  Losartan (Cozaar)  Lubiprostone (Amitiza)  Magnesium (Mag-Ox)  Meloxicam (Mobic)  Metoprolol (Toprol)  Multivitamin  Nitrofurantoin (Macrobid)  Nystatin (Mycostatin)  Omega-3 Fatty Acids  Omeprazole (Prilosec)  Pantoprazole (Protonix)  Pravastatin (Pravachol) ==================================================================== For clinical consultation, please call [phone]. ====================================================================      ROS  Constitutional: Denies any fever or chills Gastrointestinal: No reported hemesis, hematochezia, vomiting, or acute GI distress Musculoskeletal: Denies any acute onset joint swelling, redness, loss of ROM, or weakness Neurological: No reported episodes of acute onset apraxia, aphasia, dysarthria, agnosia, amnesia, paralysis,  loss of coordination, or loss of consciousness  Medication Review  Glucosamine Sulfate, HYDROcodone-acetaminophen, Omega-3, amLODipine, aspirin, calcium carbonate, citalopram, cyclobenzaprine, donepezil, gabapentin, hydrochlorothiazide, letrozole, losartan, lubiprostone, magnesium oxide, meloxicam, metoprolol succinate, multivitamin,  nystatin, omeprazole, pantoprazole, and pravastatin  History Review  Allergy: Wanda Martinez is allergic to sulfa antibiotics and vesicare [solifenacin]. Drug: Wanda Martinez  reports no history of drug use. Alcohol:  reports no history of alcohol use. Tobacco:  reports that she has never smoked. She has never used smokeless tobacco. Social: Wanda Martinez  reports that she has never smoked. She has never used smokeless tobacco. She reports that she does not drink alcohol and does not use drugs. Medical:  has a past medical history of Acute postoperative pain (10/25/2017), Anemia, Arm pain (07/26/2015), Arthritis, Arthritis, degenerative (03/26/2014), Back pain (11/01/2013), Bladder infection (06/2018), Breast cancer ([place]), Breast cancer ([place]), CHEST PAIN (04/29/2010), Chronic cystitis, Cystocele (02/22/2013), Cystocele, midline (08/19/2013), Degeneration of intervertebral disc of lumbosacral region (03/26/2014), DYSPNEA (04/29/2010), Enthesopathy of hip (03/26/2014), GERD (gastroesophageal reflux disease), Hiatal hernia, HTN (hypertension), Hypokalemia (06/2018), Hyponatremia (06/2018), LBP (low back pain) (03/26/2014), Neck pain (11/01/2013), Parkinson disease ([place]), Pneumonia (06/2018), Sinusitis (02/07/2015), Skin lesions (07/12/2014), and Urinary incontinence. Surgical: Ms. Alton  has a past surgical history that includes Tonsillectomy and adenoidectomy (81); Vesicovaginal fistula closure w/ TAH (1983); Breast surgery (1986); Breast enhancement surgery (1987); Breast implant removal; Breast implant removal (Right, 08/29/2012); Mastectomy (08/2012); Abdominal hysterectomy; Colonoscopy with propofol (N/A, 09/13/2016); and Breast biopsy (2013). Family: family history includes Colon polyps in her father; Diabetes in her father; Parkinson's disease in her mother; Stroke in her father and mother.  Laboratory Chemistry Profile   Renal Lab Results  Component Value Date   BUN 18 07/27/2020   CREATININE 0.99 07/27/2020   BCR 20  06/11/2020   GFR 44.26 (L) 01/05/2020   GFRAA >60 07/27/2020   GFRNONAA 53 (L) 07/27/2020     Hepatic Lab Results  Component Value Date   AST 20 06/11/2020   ALT 19 06/11/2020   ALBUMIN 4.4 10/17/2019   ALKPHOS 50 10/17/2019     Electrolytes Lab Results  Component Value Date   NA 137 07/27/2020   K 3.8 07/27/2020   CL 99 07/27/2020   CALCIUM 9.4 07/27/2020   MG 2.2 07/17/2018     Bone Lab Results  Component Value Date   25OHVITD1 42 07/17/2018   25OHVITD2 <1.0 07/17/2018   25OHVITD3 42 07/17/2018     Inflammation (CRP: Acute Phase) (ESR: Chronic Phase) Lab Results  Component Value Date   CRP 3 07/17/2018   ESRSEDRATE 27 07/17/2018       Note: Above Lab results reviewed.  Recent Imaging Review  DG Bone Density EXAM: DUAL X-RAY ABSORPTIOMETRY (DXA) FOR BONE MINERAL DENSITY  IMPRESSION: Your patient Wanda Martinez completed a BMD test on 07/01/2020 using the [place] (software version: 14.10) manufactured by [employer]. The following summarizes the results of our evaluation. Technologist: [facility] PATIENT BIOGRAPHICAL: Name: Wanda Martinez, Wanda Martinez Patient ID: [id number] Birth Date: 1935/03/24 Height: 63.0 in. Gender: Female Exam Date: 07/01/2020 Weight: 190.9 lbs. Indications: High Risk Meds, Advanced Age, Osteoarthritis, Postmenopausal, Hysterectomy, Breast CA, Caucasian, History of Breast Cancer, Height Loss Fractures: Treatments: Calcium, Gabapentin, Multi-Vitamin, Omeprazole, Protonix, Vitamin D DENSITOMETRY RESULTS: Site         Region     Measured Date Measured Age WHO Classification Young Adult T-score BMD         %Change vs. Previous Significant Change (*) DualFemur Neck  Left 07/01/2020 82.7 Osteopenia -2.4 0.702 g/cm2 7.5% - DualFemur Neck Left 07/01/2019 81.7 Osteoporosis -2.8 0.653 g/cm2 -6.6% - DualFemur Neck Left 07/10/2018 80.7 Osteopenia -2.4 0.699 g/cm2 -2.6% - DualFemur Neck Left 06/25/2017 79.6 Osteopenia -2.3 0.718  g/cm2 0.8% - DualFemur Neck Left 06/21/2016 78.6 Osteopenia -2.3 0.712 g/cm2 -9.6% Yes DualFemur Neck Left 06/15/2015 77.6 Osteopenia -1.8 0.788 g/cm2 3.1% - DualFemur Neck Left 08/21/2013 75.8 Osteopenia -2.0 0.764 g/cm2 - -  DualFemur Total Mean 07/01/2020 82.7 Osteopenia -2.1 0.748 g/cm2 8.7% Yes DualFemur Total Mean 07/01/2019 81.7 Osteoporosis -2.5 0.688 g/cm2 -7.4% Yes DualFemur Total Mean 07/10/2018 80.7 Osteopenia -2.1 0.743 g/cm2 1.2% - DualFemur Total Mean 06/25/2017 79.6 Osteopenia -2.2 0.734 g/cm2 -1.1% - DualFemur Total Mean 06/21/2016 78.6 Osteopenia -2.1 0.742 g/cm2 -1.9% - DualFemur Total Mean 06/15/2015 77.6 Osteopenia -2.0 0.756 g/cm2 -4.2% Yes DualFemur Total Mean 08/21/2013 75.8 Osteopenia -1.7 0.789 g/cm2 - -  Left Forearm Radius 33% 07/01/2020 82.7 Osteoporosis -2.9 0.624 g/cm2 1.5% - Left Forearm Radius 33% 07/01/2019 81.7 Osteoporosis -3.0 0.615 g/cm2 -4.9% - Left Forearm Radius 33% 07/10/2018 80.7 Osteoporosis -2.6 0.647 g/cm2 -6.9% Yes Left Forearm Radius 33% 06/25/2017 79.6 Osteopenia -2.1 0.695 g/cm2 -2.4% - Left Forearm Radius 33% 06/21/2016 78.6 Osteopenia -1.9 0.712 g/cm2 11.6% Yes Left Forearm Radius 33% 06/15/2015 77.6 Osteoporosis -2.7 0.638 g/cm2 -11.9% Yes Left Forearm Radius 33% 08/21/2013 75.8 Osteopenia -1.7 0.724 g/cm2 - - ASSESSMENT: The BMD measured at Forearm Radius 33% is 0.624 g/cm2 with a T-score of -2.9. This patient is considered osteoporotic according to [facility]) criteria. Lumbar spine was not utilized due to advanced degenerative changes. The scan quality is good. Compared with prior study, there has been significant increase in the total hip. World Pharmacologist [place] [facility]) criteria for post-menopausal, Caucasian Women: Normal:                   T-score at or above -1 SD Osteopenia/low bone mass: T-score between -1 and -2.5 SD Osteoporosis:             T-score at or below -2.5  SD  RECOMMENDATIONS: 1. All patients should optimize calcium and vitamin D intake. 2. Consider FDA-approved medical therapies in postmenopausal women and men aged 56 years and older, based on the following: a. A hip or vertebral(clinical or morphometric) fracture b. T-score < -2.5 at the femoral neck or spine after appropriate evaluation to exclude secondary causes c. Low bone mass (T-score between -1.0 and -2.5 at the femoral neck or spine) and a 10-year probability of a hip fracture > 3% or a 10-year probability of a major osteoporosis-related fracture > 20% based on the [place]-adapted WHO algorithm 3. Clinician judgment and/or patient preferences may indicate treatment for people with 10-year fracture probabilities above or below these levels FOLLOW-UP: People with diagnosed cases of osteoporosis or at high risk for fracture should have regular bone mineral density tests. For patients eligible for Medicare, routine testing is allowed once every 2 years. The testing frequency can be increased to one year for patients who have rapidly progressing disease, those who are receiving or discontinuing medical therapy to restore bone mass, or have additional risk factors.  I have reviewed this report, and agree with the above findings.  [facility]  Electronically Signed   By: Kerby Moors M.D.   On: 07/02/2020 09:51 Note: Reviewed        Physical Exam  General appearance: Well nourished, well developed, and well hydrated. In no apparent acute distress Mental  status: Alert, oriented x 3 (person, place, & time)       Respiratory: No evidence of acute respiratory distress Eyes: PERLA Vitals: BP 133/76 (BP Location: Left Arm, Patient Position: Sitting, Cuff Size: Normal)   Pulse (!) 58   Temp (!) 97.3 F (36.3 C) (Temporal)   Resp 16   Ht 5' 4" (1.626 m)   Wt 180 lb (81.6 kg)   LMP 12/18/1981   SpO2 100%   BMI 30.90 kg/m  BMI: Estimated body mass index is 30.9  kg/m as calculated from the following:   Height as of this encounter: 5' 4" (1.626 m).   Weight as of this encounter: 180 lb (81.6 kg). Ideal: Ideal body weight: 54.7 kg (120 lb 9.5 oz) Adjusted ideal body weight: 65.5 kg (144 lb 5.7 oz)  Assessment   Status Diagnosis  Controlled Controlled Controlled 1. Chronic pain syndrome   2. Chronic low back pain (Primary Area of Pain) (Bilateral) (R>L)   3. Chronic lower extremity pain (Secondary Area of Pain) (Right)   4. Pharmacologic therapy   5. Neurogenic pain      Updated Problems: Problem  Palpitations    Plan of Care  Problem-specific:  No problem-specific Assessment & Plan notes found for this encounter.  Wanda Martinez has a current medication list which includes the following long-term medication(s): amlodipine, calcium carbonate, cyclobenzaprine, donepezil, gabapentin, hydrochlorothiazide, [START ON 10/24/2020] hydrocodone-acetaminophen, [START ON 11/23/2020] hydrocodone-acetaminophen, [START ON 12/23/2020] hydrocodone-acetaminophen, losartan, magnesium oxide, meloxicam, metoprolol succinate, omeprazole, pantoprazole, and pravastatin.  Pharmacotherapy (Medications Ordered): Meds ordered this encounter  Medications  . HYDROcodone-acetaminophen (NORCO/VICODIN) 5-325 MG tablet    Sig: Take 1 tablet by mouth every 8 (eight) hours as needed for severe pain. Must last 30 days    Dispense:  90 tablet    Refill:  0    Chronic Pain: STOP Act (Not applicable) Fill 1 day early if closed on refill date. Avoid benzodiazepines within 8 hours of opioids  . gabapentin (Wanda Martinez) 100 MG capsule    Sig: Take 2 capsules (200 mg total) by mouth 2 (two) times daily AND 3-4 capsules (300-400 mg total) at bedtime.    Dispense:  240 capsule    Refill:  2  . HYDROcodone-acetaminophen (NORCO/VICODIN) 5-325 MG tablet    Sig: Take 1 tablet by mouth every 8 (eight) hours as needed for severe pain. Must last 30 days    Dispense:  90 tablet     Refill:  0    Chronic Pain: STOP Act (Not applicable) Fill 1 day early if closed on refill date. Avoid benzodiazepines within 8 hours of opioids  . HYDROcodone-acetaminophen (NORCO/VICODIN) 5-325 MG tablet    Sig: Take 1 tablet by mouth every 8 (eight) hours as needed for severe pain. Must last 30 days    Dispense:  90 tablet    Refill:  0    Chronic Pain: STOP Act (Not applicable) Fill 1 day early if closed on refill date. Avoid benzodiazepines within 8 hours of opioids   Orders:  No orders of the defined types were placed in this encounter.  Follow-up plan:   Return in about 4 months (around 01/22/2021) for (F2F), (Med Mgmt).      Interventional treatment options:  Under consideration: Diagnosticbilateral cervical facet block  Possiblebilateral cervical facet RFA    Therapeutic/palliative (PRN): Diagnostic right CESI #2  Palliative bilateral trochanteric bursa injection #2  Diagnosticright IA hip joint injection #2  Palliative right lumbar facet block #7  Palliative left lumbar facet block #4  Palliative right lumbar facet RFA #3(last done06/18/19) Palliative left lumbar facet RFA #2 (last done 02/12/2018) Diagnostic/Therapeutic right SI block #2  Diagnostic/Therapeutic left SI block #2  Palliative right S-I RFA #4 (last done 06/05/2019) Palliative right suprascapular muscle TPI/MNB #3  Palliativeright L3-4 LESI#3 Palliative right L5 TFESI #4 Diagnostic right L5-S1 LESI #3  Diagnostic/therapeutic right L4-5 LESI #2 (100/100/80/80)      Recent Visits Date Type Provider Dept  07/07/20 Office Visit Milinda Pointer, MD [facility]  Showing recent visits within past 90 days and meeting all other requirements Today's Visits Date Type Provider Dept  09/27/20 Office Visit Milinda Pointer, MD [facility]  Showing today's visits and meeting all other requirements Future Appointments No visits were found meeting these conditions. Showing future  appointments within next 90 days and meeting all other requirements  I discussed the assessment and treatment plan with the patient. The patient was provided an opportunity to ask questions and all were answered. The patient agreed with the plan and demonstrated an understanding of the instructions.  Patient advised to call back or seek an in-person evaluation if the symptoms or condition worsens.  Duration of encounter: 35 minutes.  Note by: Gaspar Cola, MD Date: 09/27/2020; Time: 3:07 PM

## 2020-09-27 ENCOUNTER — Encounter: Payer: Self-pay | Admitting: Pain Medicine

## 2020-09-27 ENCOUNTER — Other Ambulatory Visit: Payer: Self-pay

## 2020-09-27 ENCOUNTER — Ambulatory Visit: Payer: Medicare Other | Attending: Pain Medicine | Admitting: Pain Medicine

## 2020-09-27 VITALS — BP 133/76 | HR 58 | Temp 97.3°F | Resp 16 | Ht 64.0 in | Wt 180.0 lb

## 2020-09-27 DIAGNOSIS — G894 Chronic pain syndrome: Secondary | ICD-10-CM | POA: Diagnosis not present

## 2020-09-27 DIAGNOSIS — M79604 Pain in right leg: Secondary | ICD-10-CM | POA: Diagnosis not present

## 2020-09-27 DIAGNOSIS — M792 Neuralgia and neuritis, unspecified: Secondary | ICD-10-CM | POA: Diagnosis not present

## 2020-09-27 DIAGNOSIS — M545 Low back pain, unspecified: Secondary | ICD-10-CM

## 2020-09-27 DIAGNOSIS — Z79899 Other long term (current) drug therapy: Secondary | ICD-10-CM

## 2020-09-27 DIAGNOSIS — G8929 Other chronic pain: Secondary | ICD-10-CM

## 2020-09-27 MED ORDER — HYDROCODONE-ACETAMINOPHEN 5-325 MG PO TABS: 1.0000 | ORAL_TABLET | Freq: Three times a day (TID) | ORAL | 0 refills | Status: DC | PRN

## 2020-09-27 MED ORDER — GABAPENTIN 100 MG PO CAPS: ORAL_CAPSULE | ORAL | 2 refills | Status: DC

## 2020-09-27 NOTE — Progress Notes (Signed)
Nursing Pain Medication Assessment:  °Safety precautions to be maintained throughout the outpatient stay will include: orient to surroundings, keep bed in low position, maintain call bell within reach at all times, provide assistance with transfer out of bed and ambulation.  °Medication Inspection Compliance: Ms. Garringer did not comply with our request to bring her pills to be counted. She was reminded that bringing the medication bottles, even when empty, is a requirement. ° °Medication: None brought in. °Pill/Patch Count: None available to be counted. °Bottle Appearance: No container available. Did not bring bottle(s) to appointment. °Filled Date: N/A °Last Medication intake:  Today °

## 2020-09-27 NOTE — Patient Instructions (Signed)
____________________________________________________________________________________________  Medication Rules  Purpose: To inform patients, and their family members, of our rules and regulations.  Applies to: All patients receiving prescriptions (written or electronic).  Pharmacy of record: Pharmacy where electronic prescriptions will be sent. If written prescriptions are taken to a different pharmacy, please inform the nursing staff. The pharmacy listed in the electronic medical record should be the one where you would like electronic prescriptions to be sent.  Electronic prescriptions: In compliance with the [place] Strengthen Opioid Misuse Prevention (STOP) Act of 2017 (Session Law 2017-74/H243), effective December 18, 2018, all controlled substances must be electronically prescribed. Calling prescriptions to the pharmacy will cease to exist.  Prescription refills: Only during scheduled appointments. Applies to all prescriptions.  NOTE: The following applies primarily to controlled substances (Opioid* Pain Medications).   Type of encounter (visit): For patients receiving controlled substances, face-to-face visits are required. (Not an option or up to the patient.)  Patient's responsibilities: 1. Pain Pills: Bring all pain pills to every appointment (except for procedure appointments). 2. Pill Bottles: Bring pills in original pharmacy bottle. Always bring the newest bottle. Bring bottle, even if empty. 3. Medication refills: You are responsible for knowing and keeping track of what medications you take and those you need refilled. The day before your appointment: write a list of all prescriptions that need to be refilled. The day of the appointment: give the list to the admitting nurse. Prescriptions will be written only during appointments. No prescriptions will be written on procedure days. If you forget a medication: it will not be "Called in", "Faxed", or "electronically sent".  You will need to get another appointment to get these prescribed. No early refills. Do not call asking to have your prescription filled early. 4. Prescription Accuracy: You are responsible for carefully inspecting your prescriptions before leaving our office. Have the discharge nurse carefully go over each prescription with you, before taking them home. Make sure that your name is accurately spelled, that your address is correct. Check the name and dose of your medication to make sure it is accurate. Check the number of pills, and the written instructions to make sure they are clear and accurate. Make sure that you are given enough medication to last until your next medication refill appointment. 5. Taking Medication: Take medication as prescribed. When it comes to controlled substances, taking less pills or less frequently than prescribed is permitted and encouraged. Never take more pills than instructed. Never take medication more frequently than prescribed.  6. Inform other Doctors: Always inform, all of your healthcare providers, of all the medications you take. 7. Pain Medication from other Providers: You are not allowed to accept any additional pain medication from any other Doctor or Healthcare provider. There are two exceptions to this rule. (see below) In the event that you require additional pain medication, you are responsible for notifying us, as stated below. 8. Medication Agreement: You are responsible for carefully reading and following our Medication Agreement. This must be signed before receiving any prescriptions from our practice. Safely store a copy of your signed Agreement. Violations to the Agreement will result in no further prescriptions. (Additional copies of our Medication Agreement are available upon request.) 9. Laws, Rules, & Regulations: All patients are expected to follow all Federal and State Laws, Statutes, Rules, & Regulations. Ignorance of the Laws does not constitute a  valid excuse.  10. Illegal drugs and Controlled Substances: The use of illegal substances (including, but not limited to marijuana and its   derivatives) and/or the illegal use of any controlled substances is strictly prohibited. Violation of this rule may result in the immediate and permanent discontinuation of any and all prescriptions being written by our practice. The use of any illegal substances is prohibited. 11. Adopted CDC guidelines & recommendations: Target dosing levels will be at or below 60 MME/day. Use of benzodiazepines** is not recommended.  Exceptions: There are only two exceptions to the rule of not receiving pain medications from other Healthcare Providers. 1. Exception #1 (Emergencies): In the event of an emergency (i.e.: accident requiring emergency care), you are allowed to receive additional pain medication. However, you are responsible for: As soon as you are able, call our office [phone], at any time of the day or night, and leave a message stating your name, the date and nature of the emergency, and the name and dose of the medication prescribed. In the event that your call is answered by a member of our staff, make sure to document and save the date, time, and the name of the person that took your information.  2. Exception #2 (Planned Surgery): In the event that you are scheduled by another doctor or dentist to have any type of surgery or procedure, you are allowed (for a period no longer than 30 days), to receive additional pain medication, for the acute post-op pain. However, in this case, you are responsible for picking up a copy of our "Post-op Pain Management for Surgeons" handout, and giving it to your surgeon or dentist. This document is available at our office, and does not require an appointment to obtain it. Simply go to our office during business hours (Monday-Thursday from 8:00 AM to 4:00 PM) (Friday 8:00 AM to 12:00 Noon) or if you have a scheduled appointment  with us, prior to your surgery, and ask for it by name. In addition, you are responsible for: calling our office [phone], at any time of the day or night, and leaving a message stating your name, name of your surgeon, type of surgery, and date of procedure or surgery. Failure to comply with your responsibilities may result in termination of therapy involving the controlled substances.  *Opioid medications include: morphine, codeine, oxycodone, oxymorphone, hydrocodone, hydromorphone, meperidine, tramadol, tapentadol, buprenorphine, fentanyl, methadone. **Benzodiazepine medications include: diazepam (Valium), alprazolam (Xanax), clonazepam (Klonopine), lorazepam (Ativan), clorazepate (Tranxene), chlordiazepoxide (Librium), estazolam (Prosom), oxazepam (Serax), temazepam (Restoril), triazolam (Halcion) (Last updated: 08/24/2020) ____________________________________________________________________________________________   ____________________________________________________________________________________________  Medication Recommendations and Reminders  Applies to: All patients receiving prescriptions (written and/or electronic).  Medication Rules & Regulations: These rules and regulations exist for your safety and that of others. They are not flexible and neither are we. Dismissing or ignoring them will be considered "non-compliance" with medication therapy, resulting in complete and irreversible termination of such therapy. (See document titled "Medication Rules" for more details.) In all conscience, because of safety reasons, we cannot continue providing a therapy where the patient does not follow instructions.  Pharmacy of record:   Definition: This is the pharmacy where your electronic prescriptions will be sent.   We do not endorse any particular pharmacy, however, we have experienced problems with [facility] not securing enough medication supply for the community.  We do not  restrict you in your choice of pharmacy. However, once we write for your prescriptions, we will NOT be re-sending more prescriptions to fix restricted supply problems created by your pharmacy, or your insurance.   The pharmacy listed in the electronic medical record should be the   one where you want electronic prescriptions to be sent.  If you choose to change pharmacy, simply notify our nursing staff.  Recommendations:  Keep all of your pain medications in a safe place, under lock and key, even if you live alone. We will NOT replace lost, stolen, or damaged medication.  After you fill your prescription, take 1 week's worth of pills and put them away in a safe place. You should keep a separate, properly labeled bottle for this purpose. The remainder should be kept in the original bottle. Use this as your primary supply, until it runs out. Once it's gone, then you know that you have 1 week's worth of medicine, and it is time to come in for a prescription refill. If you do this correctly, it is unlikely that you will ever run out of medicine.  To make sure that the above recommendation works, it is very important that you make sure your medication refill appointments are scheduled at least 1 week before you run out of medicine. To do this in an effective manner, make sure that you do not leave the office without scheduling your next medication management appointment. Always ask the nursing staff to show you in your prescription , when your medication will be running out. Then arrange for the receptionist to get you a return appointment, at least 7 days before you run out of medicine. Do not wait until you have 1 or 2 pills left, to come in. This is very poor planning and does not take into consideration that we may need to cancel appointments due to bad weather, sickness, or emergencies affecting our staff.  DO NOT ACCEPT A "Partial Fill": If for any reason your pharmacy does not have enough pills/tablets  to completely fill or refill your prescription, do not allow for a "partial fill". The law allows the pharmacy to complete that prescription within 72 hours, without requiring a new prescription. If they do not fill the rest of your prescription within those 72 hours, you will need a separate prescription to fill the remaining amount, which we will NOT provide. If the reason for the partial fill is your insurance, you will need to talk to the pharmacist about payment alternatives for the remaining tablets, but again, DO NOT ACCEPT A PARTIAL FILL, unless you can trust your pharmacist to obtain the remainder of the pills within 72 hours.  Prescription refills and/or changes in medication(s):   Prescription refills, and/or changes in dose or medication, will be conducted only during scheduled medication management appointments. (Applies to both, written and electronic prescriptions.)  No refills on procedure days. No medication will be changed or started on procedure days. No changes, adjustments, and/or refills will be conducted on a procedure day. Doing so will interfere with the diagnostic portion of the procedure.  No phone refills. No medications will be "called into the pharmacy".  No Fax refills.  No weekend refills.  No Holliday refills.  No after hours refills.  Remember:  Business hours are:  Monday to Thursday 8:00 AM to 4:00 PM Provider's Schedule: Milinda Pointer, MD - Appointments are:  Medication management: Monday and Wednesday 8:00 AM to 4:00 PM Procedure day: Tuesday and Thursday 7:30 AM to 4:00 PM Gillis Santa, MD - Appointments are:  Medication management: Tuesday and Thursday 8:00 AM to 4:00 PM Procedure day: Monday and Wednesday 7:30 AM to 4:00 PM (Last update: 07/07/2020) ____________________________________________________________________________________________   ____________________________________________________________________________________________  CBD  (cannabidiol) WARNING  Applicable to: All individuals currently  taking or considering taking CBD (cannabidiol) and, more important, all patients taking opioid analgesic controlled substances (pain medication). (Example: oxycodone; oxymorphone; hydrocodone; hydromorphone; morphine; methadone; tramadol; tapentadol; fentanyl; buprenorphine; butorphanol; dextromethorphan; meperidine; codeine; etc.)  Legal status: CBD remains a Schedule I drug prohibited for any use. CBD is illegal with one exception. In the [place], CBD has a limited Food and Drug Administration (FDA) approval for the treatment of two specific types of epilepsy disorders. Only one CBD product has been approved by the FDA for this purpose: "Epidiolex". FDA is aware that some companies are marketing products containing cannabis and cannabis-derived compounds in ways that violate the Federal Food, Drug and Cosmetic Act (FD&C Act) and that may put the health and safety of consumers at risk. The FDA, a Federal agency, has not enforced the CBD status since 2018.   Legality: Some manufacturers ship CBD products nationally, which is illegal. Often such products are sold online and are therefore available throughout the country. CBD is openly sold in head shops and health food stores in some states where such sales have not been explicitly legalized. Selling unapproved products with unsubstantiated therapeutic claims is not only a violation of the law, but also can put patients at risk, as these products have not been proven to be safe or effective. Federal illegality makes it difficult to conduct research on CBD.  Reference: "FDA Regulation of Cannabis and Cannabis-Derived Products, Including Cannabidiol (CBD)" - https://www.fda.gov/news-events/public-health-focus/fda-regulation-cannabis-and-cannabis-derived-products-including-cannabidiol-cbd  Warning: CBD is not FDA approved and has not undergo the same manufacturing controls as prescription  drugs.  This means that the purity and safety of available CBD may be questionable. Most of the time, despite manufacturer's claims, it is contaminated with THC (delta-9-tetrahydrocannabinol - the chemical in marijuana responsible for the "HIGH").  When this is the case, the THC contaminant will trigger a positive urine drug screen (UDS) test for Marijuana (carboxy-THC). Because a positive UDS for any illicit substance is a violation of our medication agreement, your opioid analgesics (pain medicine) may be permanently discontinued.  MORE ABOUT CBD  General Information: CBD  is a derivative of the Marijuana (cannabis sativa) plant discovered in 1940. It is one of the 113 identified substances found in Marijuana. It accounts for up to 40% of the plant's extract. As of 2018, preliminary clinical studies on CBD included research for the treatment of anxiety, movement disorders, and pain. CBD is available and consumed in multiple forms, including inhalation of smoke or vapor, as an aerosol spray, and by mouth. It may be supplied as an oil containing CBD, capsules, dried cannabis, or as a liquid solution. CBD is thought not to be as psychoactive as THC (delta-9-tetrahydrocannabinol - the chemical in marijuana responsible for the "HIGH"). Studies suggest that CBD may interact with different biological target receptors in the body, including cannabinoid and other neurotransmitter receptors. As of 2018 the mechanism of action for its biological effects has not been determined.  Side-effects  Adverse reactions: Dry mouth, diarrhea, decreased appetite, fatigue, drowsiness, malaise, weakness, sleep disturbances, and others.  Drug interactions: CBC may interact with other medications such as blood-thinners. (Last update: 07/24/2020) ____________________________________________________________________________________________    

## 2020-09-29 DIAGNOSIS — Z23 Encounter for immunization: Secondary | ICD-10-CM | POA: Diagnosis not present

## 2020-10-01 ENCOUNTER — Telehealth: Payer: Medicare Other | Admitting: Internal Medicine

## 2020-10-01 ENCOUNTER — Other Ambulatory Visit: Payer: Self-pay

## 2020-11-02 ENCOUNTER — Telehealth: Payer: Self-pay | Admitting: *Deleted

## 2020-11-02 NOTE — Progress Notes (Signed)
Patient: Wanda Martinez  Service Category: E/M  Provider: Gaspar Cola, MD  DOB: 1935-06-05  DOS: 11/03/2020  Location: Office  MRN: [id number]  Setting: Ambulatory outpatient  Referring Provider: Einar Pheasant, MD  Type: Established Patient  Specialty: Interventional Pain Management  PCP: Einar Pheasant, MD  Location: Remote location  Delivery: TeleHealth     Virtual Encounter - Pain Management PROVIDER NOTE: Information contained herein reflects review and annotations entered in association with encounter. Interpretation of such information and data should be left to medically-trained personnel. Information provided to patient can be located elsewhere in the medical record under "Patient Instructions". Document created using STT-dictation technology, any transcriptional errors that may result from process are unintentional.    Contact & Pharmacy Preferred: [phone] Home: [phone] (home) Mobile: [phone] (mobile) E-mail: No e-mail address on record  [place], [place] - [address] [place] [address] Phone: [phone] Fax: [phone]   Pre-screening  Wanda Martinez offered "in-person" vs "virtual" encounter. She indicated preferring virtual for this encounter.   Reason COVID-19*  Social distancing based on CDC and AMA recommendations.   I contacted Wanda Martinez on 11/03/2020 via telephone.      I clearly identified myself as Gaspar Cola, MD. I verified that I was speaking with the correct person using two identifiers (Name: Wanda Martinez, and date of birth: [birth year]).  Consent I sought verbal advanced consent from Wanda Martinez for virtual visit interactions. I informed Wanda Martinez of possible security and privacy concerns, risks, and limitations associated with providing "not-in-person" medical evaluation and management services. I also informed Wanda Martinez of the availability of  "in-person" appointments. Finally, I informed her that there would be a charge for the virtual visit and that she could be  personally, fully or partially, financially responsible for it. Wanda Martinez expressed understanding and agreed to proceed.   Historic Elements   Wanda Martinez is a 85 y.o. year old, female patient evaluated today after our last contact on 09/27/2020. Wanda Martinez  has a past medical history of Acute postoperative pain (10/25/2017), Anemia, Arm pain (07/26/2015), Arthritis, Arthritis, degenerative (03/26/2014), Back pain (11/01/2013), Bladder infection (06/2018), Breast cancer ([place]), Breast cancer ([place]), CHEST PAIN (04/29/2010), Chronic cystitis, Cystocele (02/22/2013), Cystocele, midline (08/19/2013), Degeneration of intervertebral disc of lumbosacral region (03/26/2014), DYSPNEA (04/29/2010), Enthesopathy of hip (03/26/2014), GERD (gastroesophageal reflux disease), Hiatal hernia, HTN (hypertension), Hypokalemia (06/2018), Hyponatremia (06/2018), LBP (low back pain) (03/26/2014), Neck pain (11/01/2013), Parkinson disease ([place]), Pneumonia (06/2018), Sinusitis (02/07/2015), Skin lesions (07/12/2014), and Urinary incontinence. She also  has a past surgical history that includes Tonsillectomy and adenoidectomy (81); Vesicovaginal fistula closure w/ TAH (1983); Breast surgery (1986); Breast enhancement surgery (1987); Breast implant removal; Breast implant removal (Right, 08/29/2012); Mastectomy (08/2012); Abdominal hysterectomy; Colonoscopy with propofol (N/A, 09/13/2016); and Breast biopsy (2013). Wanda Martinez has a current medication list which includes the following prescription(s): amlodipine, aspirin, calcium carbonate, citalopram, cyclobenzaprine, donepezil, gabapentin, glucosamine sulfate, hydrochlorothiazide, hydrocodone-acetaminophen, [START ON 11/23/2020] hydrocodone-acetaminophen, [START ON 12/23/2020] hydrocodone-acetaminophen, letrozole, losartan, lubiprostone, magnesium oxide, meloxicam, metoprolol  succinate, multivitamin, nystatin, omega-3, omeprazole, pantoprazole, and pravastatin. She  reports that she has never smoked. She has never used smokeless tobacco. She reports that she does not drink alcohol and does not use drugs. Wanda Martinez is allergic to sulfa antibiotics and vesicare [solifenacin].   HPI  Today, she is being contacted for worsening of previously known (established) problem.  She refers having gone to visit her  brother and after all like the activities that the did together, she has been experiencing an increase in her low back pain, right hip pain, and right lower extremity pain going all the way down through the back of her calf into the lateral aspect of the foot and the little toe and what seems to be an S1 dermatomal distribution.  In the past we have treated this with some transforaminal epidural steroid injections at the L5 level, but this seems to be more a sacral issue and therefore I will go ahead and have her come in for a possible caudal epidural steroid injection versus right S1 transforaminal ESI combined with a right L3-4 LESI  Pharmacotherapy Assessment  Analgesic: Hydrocodone/APAP 5/325, 1 tab PO q 8 hrs (15 mg/day of hydrocodone) MME/day:15mg /day.   Monitoring: [place] PMP: PDMP reviewed during this encounter.       Pharmacotherapy: No side-effects or adverse reactions reported. Compliance: No problems identified. Effectiveness: Clinically acceptable. Plan: Refer to "POC".  UDS:  Summary  Date Value Ref Range Status  07/07/2020 Note  Final    Comment:    ==================================================================== [employer] [facility] (MW) ==================================================================== Test                             Result       Flag       Units  Drug Present and Declared for Prescription Verification   Hydrocodone                    389          EXPECTED   ng/mg creat   Dihydrocodeine                 131          EXPECTED    ng/mg creat   Norhydrocodone                 1443         EXPECTED   ng/mg creat    Sources of hydrocodone include scheduled prescription medications.    Dihydrocodeine and norhydrocodone are expected metabolites of    hydrocodone. Dihydrocodeine is also available as a scheduled    prescription medication.  ==================================================================== Test                      Result    Flag   Units      Ref Range   Creatinine              102              mg/dL      >=20 ==================================================================== Declared Medications:  The flagging and interpretation on this report are based on the  following declared medications.  Unexpected results may arise from  inaccuracies in the declared medications.   **Note: The testing scope of this panel includes these medications:   Hydrocodone (Norco)   **Note: The testing scope of this panel does not include the  following reported medications:   Acetaminophen (Norco)  Amlodipine (Norvasc)  Aspirin  Calcium  Citalopram (Celexa)  Cyclobenzaprine (Flexeril)  Donepezil (Aricept)  Gabapentin (Neurontin)  Hydrochlorothiazide (Hydrodiuril)  Letrozole (Femara)  Losartan (Cozaar)  Lubiprostone (Amitiza)  Magnesium (Mag-Ox)  Meloxicam (Mobic)  Metoprolol (Toprol)  Multivitamin  Nitrofurantoin (Macrobid)  Nystatin (Mycostatin)  Omega-3 Fatty Acids  Omeprazole (Prilosec)  Pantoprazole (Protonix)  Pravastatin (Pravachol) ==================================================================== For clinical consultation, please  call [phone]. ====================================================================     Laboratory Chemistry Profile   Renal Lab Results  Component Value Date   BUN 18 07/27/2020   CREATININE 0.99 07/27/2020   BCR 20 06/11/2020   GFR 44.26 (L) 01/05/2020   GFRAA >60 07/27/2020   GFRNONAA 53 (L) 07/27/2020     Hepatic Lab Results  Component  Value Date   AST 20 06/11/2020   ALT 19 06/11/2020   ALBUMIN 4.4 10/17/2019   ALKPHOS 50 10/17/2019     Electrolytes Lab Results  Component Value Date   NA 137 07/27/2020   K 3.8 07/27/2020   CL 99 07/27/2020   CALCIUM 9.4 07/27/2020   MG 2.2 07/17/2018     Bone Lab Results  Component Value Date   25OHVITD1 42 07/17/2018   25OHVITD2 <1.0 07/17/2018   25OHVITD3 42 07/17/2018     Inflammation (CRP: Acute Phase) (ESR: Chronic Phase) Lab Results  Component Value Date   CRP 3 07/17/2018   ESRSEDRATE 27 07/17/2018       Note: Above Lab results reviewed.  Imaging  DG Bone Density EXAM: DUAL X-RAY ABSORPTIOMETRY (DXA) FOR BONE MINERAL DENSITY  IMPRESSION: Your patient Edelmira Gallogly completed a BMD test on 07/01/2020 using the [employer] DXA System (software version: 14.10) manufactured by [employer]. The following summarizes the results of our evaluation. Technologist: [facility] PATIENT BIOGRAPHICAL: Name: Courtnie, Brenes Patient ID: [id number] Birth Date: [birth year] Height: 63.0 in. Gender: Female Exam Date: 07/01/2020 Weight: 190.9 lbs. Indications: High Risk Meds, Advanced Age, Osteoarthritis, Postmenopausal, Hysterectomy, Breast CA, Caucasian, History of Breast Cancer, Height Loss Fractures: Treatments: Calcium, Gabapentin, Multi-Vitamin, Omeprazole, Protonix, Vitamin D DENSITOMETRY RESULTS: Site         Region     Measured Date Measured Age WHO Classification Young Adult T-score BMD         %Change vs. Previous Significant Change (*) DualFemur Neck Left 07/01/2020 82.7 Osteopenia -2.4 0.702 g/cm2 7.5% - DualFemur Neck Left 07/01/2019 81.7 Osteoporosis -2.8 0.653 g/cm2 -6.6% - DualFemur Neck Left 07/10/2018 80.7 Osteopenia -2.4 0.699 g/cm2 -2.6% - DualFemur Neck Left 06/25/2017 79.6 Osteopenia -2.3 0.718 g/cm2 0.8% - DualFemur Neck Left 06/21/2016 78.6 Osteopenia -2.3 0.712 g/cm2 -9.6% Yes DualFemur Neck Left 06/15/2015 77.6 Osteopenia -1.8 0.788  g/cm2 3.1% - DualFemur Neck Left 08/21/2013 75.8 Osteopenia -2.0 0.764 g/cm2 - -  DualFemur Total Mean 07/01/2020 82.7 Osteopenia -2.1 0.748 g/cm2 8.7% Yes DualFemur Total Mean 07/01/2019 81.7 Osteoporosis -2.5 0.688 g/cm2 -7.4% Yes DualFemur Total Mean 07/10/2018 80.7 Osteopenia -2.1 0.743 g/cm2 1.2% - DualFemur Total Mean 06/25/2017 79.6 Osteopenia -2.2 0.734 g/cm2 -1.1% - DualFemur Total Mean 06/21/2016 78.6 Osteopenia -2.1 0.742 g/cm2 -1.9% - DualFemur Total Mean 06/15/2015 77.6 Osteopenia -2.0 0.756 g/cm2 -4.2% Yes DualFemur Total Mean 08/21/2013 75.8 Osteopenia -1.7 0.789 g/cm2 - -  Left Forearm Radius 33% 07/01/2020 82.7 Osteoporosis -2.9 0.624 g/cm2 1.5% - Left Forearm Radius 33% 07/01/2019 81.7 Osteoporosis -3.0 0.615 g/cm2 -4.9% - Left Forearm Radius 33% 07/10/2018 80.7 Osteoporosis -2.6 0.647 g/cm2 -6.9% Yes Left Forearm Radius 33% 06/25/2017 79.6 Osteopenia -2.1 0.695 g/cm2 -2.4% - Left Forearm Radius 33% 06/21/2016 78.6 Osteopenia -1.9 0.712 g/cm2 11.6% Yes Left Forearm Radius 33% 06/15/2015 77.6 Osteoporosis -2.7 0.638 g/cm2 -11.9% Yes Left Forearm Radius 33% 08/21/2013 75.8 Osteopenia -1.7 0.724 g/cm2 - - ASSESSMENT: The BMD measured at Forearm Radius 33% is 0.624 g/cm2 with a T-score of -2.9. This patient is considered osteoporotic according to World Health Organization [facility]) criteria. Lumbar spine was not utilized  due to advanced degenerative changes. The scan quality is good. Compared with prior study, there has been significant increase in the total hip. World Pharmacologist [facility]) criteria for post-menopausal, Caucasian Women: Normal:                   T-score at or above -1 SD Osteopenia/low bone mass: T-score between -1 and -2.5 SD Osteoporosis:             T-score at or below -2.5 SD  RECOMMENDATIONS: 1. All patients should optimize calcium and vitamin D intake. 2. Consider FDA-approved medical therapies in postmenopausal women and men aged  25 years and older, based on the following: a. A hip or vertebral(clinical or morphometric) fracture b. T-score < -2.5 at the femoral neck or spine after appropriate evaluation to exclude secondary causes c. Low bone mass (T-score between -1.0 and -2.5 at the femoral neck or spine) and a 10-year probability of a hip fracture > 3% or a 10-year probability of a major osteoporosis-related fracture > 20% based on the [place]-adapted WHO algorithm 3. Clinician judgment and/or patient preferences may indicate treatment for people with 10-year fracture probabilities above or below these levels FOLLOW-UP: People with diagnosed cases of osteoporosis or at high risk for fracture should have regular bone mineral density tests. For patients eligible for Medicare, routine testing is allowed once every 2 years. The testing frequency can be increased to one year for patients who have rapidly progressing disease, those who are receiving or discontinuing medical therapy to restore bone mass, or have additional risk factors.  I have reviewed this report, and agree with the above findings.  [facility]  Electronically Signed   By: Kerby Moors M.D.   On: 07/02/2020 09:51  Assessment  The primary encounter diagnosis was Chronic pain syndrome. Diagnoses of Chronic low back pain (Primary Area of Pain) (Bilateral) (R>L), Chronic lower extremity pain (Secondary Area of Pain) (Right), Lumbar spinal stenosis (with neurogenic claudication) (L3-4), Chronic lumbar radicular pain (S1) (Right), and Abnormal MRI, lumbar spine (12/30/2019) were also pertinent to this visit.  Plan of Care  Problem-specific:  No problem-specific Assessment & Plan notes found for this encounter.  Ms. Zareen Jamison has a current medication list which includes the following long-term medication(s): amlodipine, calcium carbonate, cyclobenzaprine, donepezil, gabapentin, hydrochlorothiazide, hydrocodone-acetaminophen,  [START ON 11/23/2020] hydrocodone-acetaminophen, [START ON 12/23/2020] hydrocodone-acetaminophen, losartan, magnesium oxide, meloxicam, metoprolol succinate, omeprazole, pantoprazole, and pravastatin.  Pharmacotherapy (Medications Ordered): No orders of the defined types were placed in this encounter.  Orders:  No orders of the defined types were placed in this encounter.  Follow-up plan:   Return for Procedure (w/ sedation): (R) S1 TFESI #1 + (R) L3-4 LESI #1.      Interventional treatment options:  Under consideration: Diagnosticbilateral cervical facet block  Possiblebilateral cervical facet RFA    Therapeutic/palliative (PRN): Diagnostic right CESI #2  Palliative bilateral trochanteric bursa injection #2  Diagnosticright IA hip joint injection #2  Palliative right lumbar facet block #7  Palliative left lumbar facet block #4  Palliative right lumbar facet RFA #3(last done06/18/19) Palliative left lumbar facet RFA #2 (last done 02/12/2018) Diagnostic/Therapeutic right SI block #2  Diagnostic/Therapeutic left SI block #2  Palliative right S-I RFA #4 (last done 06/05/2019) Palliative right suprascapular muscle TPI/MNB #3  Palliativeright L3-4 LESI#3 Palliative right L5 TFESI #4 Diagnostic right L5-S1 LESI #3  Diagnostic/therapeutic right L4-5 LESI #2 (100/100/80/80)    Recent Visits Date Type Provider Dept  09/27/20 Office Visit  Milinda Pointer, MD [facility]  Showing recent visits within past 90 days and meeting all other requirements Today's Visits Date Type Provider Dept  11/03/20 Telemedicine Milinda Pointer, MD [facility]  Showing today's visits and meeting all other requirements Future Appointments Date Type Provider Dept  01/12/21 Appointment Milinda Pointer, MD [facility]  Showing future appointments within next 90 days and meeting all other requirements  I discussed the assessment and treatment plan with the  patient. The patient was provided an opportunity to ask questions and all were answered. The patient agreed with the plan and demonstrated an understanding of the instructions.  Patient advised to call back or seek an in-person evaluation if the symptoms or condition worsens.  Duration of encounter: 17 minutes.  Note by: Gaspar Cola, MD Date: 11/03/2020; Time: 5:59 PM

## 2020-11-02 NOTE — Telephone Encounter (Signed)
Attempted to call for pre appointment review of allergies/meds. Message left. 

## 2020-11-03 ENCOUNTER — Other Ambulatory Visit: Payer: Self-pay

## 2020-11-03 ENCOUNTER — Ambulatory Visit: Payer: Medicare Other | Attending: Pain Medicine | Admitting: Pain Medicine

## 2020-11-03 DIAGNOSIS — M5410 Radiculopathy, site unspecified: Secondary | ICD-10-CM | POA: Diagnosis not present

## 2020-11-03 DIAGNOSIS — M48062 Spinal stenosis, lumbar region with neurogenic claudication: Secondary | ICD-10-CM | POA: Diagnosis not present

## 2020-11-03 DIAGNOSIS — G894 Chronic pain syndrome: Secondary | ICD-10-CM

## 2020-11-03 DIAGNOSIS — G8929 Other chronic pain: Secondary | ICD-10-CM

## 2020-11-03 DIAGNOSIS — R937 Abnormal findings on diagnostic imaging of other parts of musculoskeletal system: Secondary | ICD-10-CM

## 2020-11-03 DIAGNOSIS — M79604 Pain in right leg: Secondary | ICD-10-CM

## 2020-11-03 DIAGNOSIS — M545 Low back pain, unspecified: Secondary | ICD-10-CM

## 2020-11-03 NOTE — Patient Instructions (Signed)
____________________________________________________________________________________________  Preparing for Procedure with Sedation  Procedure appointments are limited to planned procedures: . No Prescription Refills. . No disability issues will be discussed. . No medication changes will be discussed.  Instructions: . Oral Intake: Do not eat or drink anything for at least 8 hours prior to your procedure. (Exception: Blood Pressure Medication. See below.) . Transportation: Unless otherwise stated by your physician, you may drive yourself after the procedure. . Blood Pressure Medicine: Do not forget to take your blood pressure medicine with a sip of water the morning of the procedure. If your Diastolic (lower reading)is above 100 mmHg, elective cases will be cancelled/rescheduled. . Blood thinners: These will need to be stopped for procedures. Notify our staff if you are taking any blood thinners. Depending on which one you take, there will be specific instructions on how and when to stop it. . Diabetics on insulin: Notify the staff so that you can be scheduled 1st case in the morning. If your diabetes requires high dose insulin, take only  of your normal insulin dose the morning of the procedure and notify the staff that you have done so. . Preventing infections: Shower with an antibacterial soap the morning of your procedure. . Build-up your immune system: Take 1000 mg of Vitamin C with every meal (3 times a day) the day prior to your procedure. . Antibiotics: Inform the staff if you have a condition or reason that requires you to take antibiotics before dental procedures. . Pregnancy: If you are pregnant, call and cancel the procedure. . Sickness: If you have a cold, fever, or any active infections, call and cancel the procedure. . Arrival: You must be in the facility at least 30 minutes prior to your scheduled procedure. . Children: Do not bring children with you. . Dress appropriately:  Bring dark clothing that you would not mind if they get stained. . Valuables: Do not bring any jewelry or valuables.  Reasons to call and reschedule or cancel your procedure: (Following these recommendations will minimize the risk of a serious complication.) . Surgeries: Avoid having procedures within 2 weeks of any surgery. (Avoid for 2 weeks before or after any surgery). . Flu Shots: Avoid having procedures within 2 weeks of a flu shots or . (Avoid for 2 weeks before or after immunizations). . Barium: Avoid having a procedure within 7-10 days after having had a radiological study involving the use of radiological contrast. (Myelograms, Barium swallow or enema study). . Heart attacks: Avoid any elective procedures or surgeries for the initial 6 months after a "Myocardial Infarction" (Heart Attack). . Blood thinners: It is imperative that you stop these medications before procedures. Let us know if you if you take any blood thinner.  . Infection: Avoid procedures during or within two weeks of an infection (including chest colds or gastrointestinal problems). Symptoms associated with infections include: Localized redness, fever, chills, night sweats or profuse sweating, burning sensation when voiding, cough, congestion, stuffiness, runny nose, sore throat, diarrhea, nausea, vomiting, cold or Flu symptoms, recent or current infections. It is specially important if the infection is over the area that we intend to treat. . Heart and lung problems: Symptoms that may suggest an active cardiopulmonary problem include: cough, chest pain, breathing difficulties or shortness of breath, dizziness, ankle swelling, uncontrolled high or unusually low blood pressure, and/or palpitations. If you are experiencing any of these symptoms, cancel your procedure and contact your primary care physician for an evaluation.  Remember:  Regular Business hours are:    Monday to Thursday 8:00 AM to 4:00 PM  Provider's  Schedule: Riker Collier, MD:  Procedure days: Tuesday and Thursday 7:30 AM to 4:00 PM  Bilal Lateef, MD:  Procedure days: Monday and Wednesday 7:30 AM to 4:00 PM ____________________________________________________________________________________________    

## 2020-11-04 ENCOUNTER — Other Ambulatory Visit: Payer: Self-pay

## 2020-11-04 ENCOUNTER — Ambulatory Visit
Admission: RE | Admit: 2020-11-04 | Discharge: 2020-11-04 | Disposition: A | Discharge status: Home / Self Care | Payer: Medicare Other | Source: Ambulatory Visit | Attending: Pain Medicine | Admitting: Pain Medicine

## 2020-11-04 ENCOUNTER — Encounter: Payer: Self-pay | Admitting: Pain Medicine

## 2020-11-04 ENCOUNTER — Ambulatory Visit (HOSPITAL_BASED_OUTPATIENT_CLINIC_OR_DEPARTMENT_OTHER): Payer: Medicare Other | Admitting: Pain Medicine

## 2020-11-04 VITALS — BP 148/72 | HR 59 | Temp 97.7°F | Resp 16 | Ht 64.0 in | Wt 180.0 lb

## 2020-11-04 DIAGNOSIS — M79604 Pain in right leg: Secondary | ICD-10-CM | POA: Diagnosis not present

## 2020-11-04 DIAGNOSIS — M5137 Other intervertebral disc degeneration, lumbosacral region: Secondary | ICD-10-CM

## 2020-11-04 DIAGNOSIS — M48062 Spinal stenosis, lumbar region with neurogenic claudication: Secondary | ICD-10-CM | POA: Diagnosis not present

## 2020-11-04 DIAGNOSIS — M545 Low back pain, unspecified: Secondary | ICD-10-CM | POA: Insufficient documentation

## 2020-11-04 DIAGNOSIS — M5410 Radiculopathy, site unspecified: Secondary | ICD-10-CM | POA: Diagnosis not present

## 2020-11-04 DIAGNOSIS — G8929 Other chronic pain: Secondary | ICD-10-CM | POA: Diagnosis not present

## 2020-11-04 DIAGNOSIS — M4807 Spinal stenosis, lumbosacral region: Secondary | ICD-10-CM | POA: Diagnosis not present

## 2020-11-04 DIAGNOSIS — M25551 Pain in right hip: Secondary | ICD-10-CM

## 2020-11-04 DIAGNOSIS — M48061 Spinal stenosis, lumbar region without neurogenic claudication: Secondary | ICD-10-CM

## 2020-11-04 DIAGNOSIS — R937 Abnormal findings on diagnostic imaging of other parts of musculoskeletal system: Secondary | ICD-10-CM | POA: Diagnosis not present

## 2020-11-04 DIAGNOSIS — M51379 Other intervertebral disc degeneration, lumbosacral region without mention of lumbar back pain or lower extremity pain: Secondary | ICD-10-CM

## 2020-11-04 MED ORDER — SODIUM CHLORIDE (PF) 0.9 % IJ SOLN
INTRAMUSCULAR | Status: AC
  Filled 2020-11-04: qty 10

## 2020-11-04 MED ORDER — LIDOCAINE HCL 2 % IJ SOLN
20.0000 mL | Freq: Once | INTRAMUSCULAR | Status: AC
  Administered 2020-11-04: 400 mg

## 2020-11-04 MED ORDER — SODIUM CHLORIDE 0.9% FLUSH: 2.0000 mL | Freq: Once | INTRAVENOUS | Status: DC

## 2020-11-04 MED ORDER — TRIAMCINOLONE ACETONIDE 40 MG/ML IJ SUSP
INTRAMUSCULAR | Status: AC
  Filled 2020-11-04: qty 1

## 2020-11-04 MED ORDER — IOHEXOL 180 MG/ML  SOLN
10.0000 mL | Freq: Once | INTRAMUSCULAR | Status: AC
  Administered 2020-11-04: 10 mL via EPIDURAL

## 2020-11-04 MED ORDER — ROPIVACAINE HCL 2 MG/ML IJ SOLN
2.0000 mL | Freq: Once | INTRAMUSCULAR | Status: AC
  Administered 2020-11-04: 2 mL via EPIDURAL

## 2020-11-04 MED ORDER — DEXAMETHASONE SODIUM PHOSPHATE 10 MG/ML IJ SOLN
INTRAMUSCULAR | Status: AC
  Filled 2020-11-04: qty 1

## 2020-11-04 MED ORDER — TRIAMCINOLONE ACETONIDE 40 MG/ML IJ SUSP
40.0000 mg | Freq: Once | INTRAMUSCULAR | Status: AC
  Administered 2020-11-04: 40 mg

## 2020-11-04 MED ORDER — MIDAZOLAM HCL 5 MG/5ML IJ SOLN: 1.0000 mg | INTRAMUSCULAR | Status: DC | PRN

## 2020-11-04 MED ORDER — LIDOCAINE HCL 2 % IJ SOLN
INTRAMUSCULAR | Status: AC
  Filled 2020-11-04: qty 20

## 2020-11-04 MED ORDER — DEXAMETHASONE SODIUM PHOSPHATE 10 MG/ML IJ SOLN
10.0000 mg | Freq: Once | INTRAMUSCULAR | Status: AC
  Administered 2020-11-04: 10 mg

## 2020-11-04 MED ORDER — ROPIVACAINE HCL 2 MG/ML IJ SOLN
INTRAMUSCULAR | Status: AC
  Filled 2020-11-04: qty 10

## 2020-11-04 MED ORDER — LACTATED RINGERS IV SOLN: 1000.0000 mL | Freq: Once | INTRAVENOUS | Status: DC

## 2020-11-04 MED ORDER — FENTANYL CITRATE (PF) 100 MCG/2ML IJ SOLN: 25.0000 ug | INTRAMUSCULAR | Status: DC | PRN

## 2020-11-04 NOTE — Progress Notes (Signed)
Safety precautions to be maintained throughout the outpatient stay will include: orient to surroundings, keep bed in low position, maintain call bell within reach at all times, provide assistance with transfer out of bed and ambulation.  

## 2020-11-04 NOTE — Progress Notes (Signed)
PROVIDER NOTE: Information contained herein reflects review and annotations entered in association with encounter. Interpretation of such information and data should be left to medically-trained personnel. Information provided to patient can be located elsewhere in the medical record under "Patient Instructions". Document created using STT-dictation technology, any transcriptional errors that may result from process are unintentional.    Patient: Wanda Martinez  Service Category: Procedure  Provider: Gaspar Cola, MD  DOB: 01/07/1935  DOS: 11/04/2020  Location: [facility]  MRN: [id number]  Setting: Ambulatory - outpatient  Referring Provider: Einar Pheasant, MD  Type: Established Patient  Specialty: Interventional Pain Management  PCP: Einar Pheasant, MD   Primary Reason for Visit: Interventional Pain Management Treatment. CC: Hip Pain (right)  Procedure #1:  Anesthesia, Analgesia, Anxiolysis:  Type: Therapeutic Trans-Foraminal Epidural Steroid Injection   #1  Region: Lumbar Level: S1 Paravertebral Laterality: Right Paravertebral   Type: Local Anesthesia Indication(s): Analgesia         Route: Infiltration (Natural Steps/IM) IV Access: Declined Sedation: Declined  Local Anesthetic: Lidocaine 1-2%  Position: Prone  Procedure #2:    Type: Therapeutic Inter-Laminar Epidural Steroid Injection   #1  Region: Lumbar Level: L3-4 Level. Laterality: Right Paramedial     Indications: 1. Chronic lower extremity pain (Secondary Area of Pain) (Right)   2. Chronic lumbar radicular pain (S1) (Right)   3. Chronic low back pain (Primary Area of Pain) (Bilateral) (R>L)   4. DDD (degenerative disc disease), lumbosacral   5. Lumbar spinal stenosis (with neurogenic claudication) (L3-4)   6. 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 Chronic hip pain (Right)    Pain Score: Pre-procedure: 6 /10 Post-procedure: 0-No pain (standing and moving around)/10    Pre-op H&P Assessment:  Wanda Martinez is a 85 y.o. (year old), female patient, seen today for interventional treatment. She  has a past surgical history that includes Tonsillectomy and adenoidectomy (81); Vesicovaginal fistula closure w/ TAH (1983); Breast surgery (1986); Breast enhancement surgery (1987); Breast implant removal; Breast implant removal (Right, 08/29/2012); Mastectomy (08/2012); Abdominal hysterectomy; Colonoscopy with propofol (N/A, 09/13/2016); and Breast biopsy (2013). Ms. Fretwell has a current medication list which includes the following prescription(s): amlodipine, aspirin, calcium carbonate, citalopram, cyclobenzaprine, donepezil, gabapentin, glucosamine sulfate, hydrochlorothiazide, hydrocodone-acetaminophen, [START ON 11/23/2020] hydrocodone-acetaminophen, [START ON 12/23/2020] hydrocodone-acetaminophen, letrozole, losartan, lubiprostone, magnesium oxide, meloxicam, metoprolol succinate, multivitamin, nystatin, omega-3, omeprazole, pantoprazole, and pravastatin, and the following Facility-Administered Medications: sodium chloride flush and sodium chloride flush. Her primarily concern today is the Hip Pain (right)  Initial Vital Signs:  Pulse/HCG Rate: (!) 51 (sb)  Temp: 97.7 F (36.5 C) Resp: 16 BP: (!) 142/67 SpO2: 99 %  BMI: Estimated body mass index is 30.9 kg/m as calculated from the following:   Height as of this encounter: 5\' 4"  (1.626 m).   Weight as of this encounter: 180 lb (81.6 kg).  Risk Assessment: Allergies: Reviewed. She is allergic to sulfa antibiotics and vesicare [solifenacin].  Allergy Precautions: None required Coagulopathies: Reviewed. None identified.  Blood-thinner therapy: None at this time Active Infection(s): Reviewed. None identified. Wanda Martinez is afebrile  Site Confirmation: Ms. Horseman was asked to confirm the procedure and laterality before marking the site Procedure checklist: Completed Consent: Before the procedure and under the influence of  no sedative(s), amnesic(s), or anxiolytics, the patient was informed of the treatment options, risks and possible complications. To fulfill our ethical and legal obligations, as recommended by the American Medical Association's Code of Ethics, I have informed the patient  of my clinical impression; the nature and purpose of the treatment or procedure; the risks, benefits, and possible complications of the intervention; the alternatives, including doing nothing; the risk(s) and benefit(s) of the alternative treatment(s) or procedure(s); and the risk(s) and benefit(s) of doing nothing. The patient was provided information about the general risks and possible complications associated with the procedure. These may include, but are not limited to: failure to achieve desired goals, infection, bleeding, organ or nerve damage, allergic reactions, paralysis, and death. In addition, the patient was informed of those risks and complications associated to Spine-related procedures, such as failure to decrease pain; infection (i.e.: Meningitis, epidural or intraspinal abscess); bleeding (i.e.: epidural hematoma, subarachnoid hemorrhage, or any other type of intraspinal or peri-dural bleeding); organ or nerve damage (i.e.: Any type of peripheral nerve, nerve root, or spinal cord injury) with subsequent damage to sensory, motor, and/or autonomic systems, resulting in permanent pain, numbness, and/or weakness of one or several areas of the body; allergic reactions; (i.e.: anaphylactic reaction); and/or death. Furthermore, the patient was informed of those risks and complications associated with the medications. These include, but are not limited to: allergic reactions (i.e.: anaphylactic or anaphylactoid reaction(s)); adrenal axis suppression; blood sugar elevation that in diabetics may result in ketoacidosis or comma; water retention that in patients with history of congestive heart failure may result in shortness of breath,  pulmonary edema, and decompensation with resultant heart failure; weight gain; swelling or edema; medication-induced neural toxicity; particulate matter embolism and blood vessel occlusion with resultant organ, and/or nervous system infarction; and/or aseptic necrosis of one or more joints. Finally, the patient was informed that Medicine is not an exact science; therefore, there is also the possibility of unforeseen or unpredictable risks and/or possible complications that may result in a catastrophic outcome. The patient indicated having understood very clearly. We have given the patient no guarantees and we have made no promises. Enough time was given to the patient to ask questions, all of which were answered to the patient's satisfaction. Ms. Biggs has indicated that she wanted to continue with the procedure. Attestation: I, the ordering provider, attest that I have discussed with the patient the benefits, risks, side-effects, alternatives, likelihood of achieving goals, and potential problems during recovery for the procedure that I have provided informed consent. Date  Time: 11/04/2020  8:03 AM  Pre-Procedure Preparation:  Monitoring: As per clinic protocol. Respiration, ETCO2, SpO2, BP, heart rate and rhythm monitor placed and checked for adequate function Safety Precautions: Patient was assessed for positional comfort and pressure points before starting the procedure. Time-out: I initiated and conducted the "Time-out" before starting the procedure, as per protocol. The patient was asked to participate by confirming the accuracy of the "Time Out" information. Verification of the correct person, site, and procedure were performed and confirmed by me, the nursing staff, and the patient. "Time-out" conducted as per Joint Commission's Universal Protocol (UP.01.01.01). Time: 0827  Description of Procedure #1:  Target Area: The inferior and lateral portion of the pedicle, just lateral to a line  created by the 6:00 position of the pedicle and the superior articular process of the vertebral body below. On the lateral view, this target lies just posterior to the anterior aspect of the lamina and posterior to the midpoint created between the anterior and the posterior aspect of the neural foramina. Approach: Posterior paravertebral approach. Area Prepped: Entire Posterior Lumbosacral Region DuraPrep (Iodine Povacrylex [0.7% available iodine] and Isopropyl Alcohol, 74% w/w) Safety Precautions: Aspiration looking for blood  return was conducted prior to all injections. At no point did we inject any substances, as a needle was being advanced. No attempts were made at seeking any paresthesias. Safe injection practices and needle disposal techniques used. Medications properly checked for expiration dates. SDV (single dose vial) medications used.  Description of the Procedure: Protocol guidelines were followed. The patient was placed in position over the fluoroscopy table. The target area was identified and the area prepped in the usual manner. Skin & deeper tissues infiltrated with local anesthetic. Appropriate amount of time allowed to pass for local anesthetics to take effect. The procedure needles were then advanced to the target area. Proper needle placement secured. Negative aspiration confirmed. Solution injected in intermittent fashion, asking for systemic symptoms every 0.2cc of injectate. The needles were then removed and the area cleansed, making sure to leave some of the prepping solution back to take advantage of its long term bactericidal properties.  Start Time: 0827 hrs.  Materials:  Needle(s) Type: Spinal Needle Gauge: 22G Length: 3.5-in Medication(s): Please see orders for medications and dosing details.  Description of Procedure #2:  Target Area: The  interlaminar space, initially targeting the lower border of the superior vertebral body lamina. Approach: Posterior paramedial  approach. Area Prepped: Same as above Prepping solution: Same as above Safety Precautions: Same as above  Description of the Procedure: Protocol guidelines were followed. The patient was placed in position over the fluoroscopy table. The target area was identified and the area prepped in the usual manner. Skin & deeper tissues infiltrated with local anesthetic. Appropriate amount of time allowed to pass for local anesthetics to take effect. The procedure needle was introduced through the skin, ipsilateral to the reported pain, and advanced to the target area. Bone was contacted and the needle walked caudad, until the lamina was cleared. The ligamentum flavum was engaged and loss-of-resistance technique used as the epidural needle was advanced. The epidural space was identified using "loss-of-resistance technique" with 2-3 ml of PF-NaCl (0.9% NSS), in a 5cc LOR glass syringe. Proper needle placement secured. Negative aspiration confirmed. Solution injected in intermittent fashion, asking for systemic symptoms every 0.5cc of injectate. The needles were then removed and the area cleansed, making sure to leave some of the prepping solution back to take advantage of its long term bactericidal properties.  Vitals:   11/04/20 0820 11/04/20 0830 11/04/20 0840 11/04/20 0847  BP: (!) 153/74 (!) 145/69 (!) 146/70 (!) 148/72  Pulse: (!) 54 (!) 55 (!) 57 (!) 59  Resp: 16 13 15 16   Temp:      TempSrc:      SpO2: 100% 99% 99% 99%  Weight:      Height:        End Time: 0841 hrs.  Materials:  Needle(s) Type: Epidural needle Gauge: 17G Length: 3.5-in Medication(s): Please see orders for medications and dosing details.  Imaging Guidance (Spinal):          Type of Imaging Technique: Fluoroscopy Guidance (Spinal) Indication(s): Assistance in needle guidance and placement for procedures requiring needle placement in or near specific anatomical locations not easily accessible without such assistance. Exposure  Time: Please see nurses notes. Contrast: Before injecting any contrast, we confirmed that the patient did not have an allergy to iodine, shellfish, or radiological contrast. Once satisfactory needle placement was completed at the desired level, radiological contrast was injected. Contrast injected under live fluoroscopy. No contrast complications. See chart for type and volume of contrast used. Fluoroscopic Guidance: I was personally  present during the use of fluoroscopy. "Tunnel Vision Technique" used to obtain the best possible view of the target area. Parallax error corrected before commencing the procedure. "Direction-depth-direction" technique used to introduce the needle under continuous pulsed fluoroscopy. Once target was reached, antero-posterior, oblique, and lateral fluoroscopic projection used confirm needle placement in all planes. Images permanently stored in EMR. Interpretation: I personally interpreted the imaging intraoperatively. Adequate needle placement confirmed in multiple planes. Appropriate spread of contrast into desired area was observed. No evidence of afferent or efferent intravascular uptake. No intrathecal or subarachnoid spread observed. Permanent images saved into the patient's record.  Antibiotic Prophylaxis:   Anti-infectives (From admission, onward)   None     Indication(s): None identified  Post-operative Assessment:  Post-procedure Vital Signs:  Pulse/HCG Rate: (!) 59 (sb)  Temp: 97.7 F (36.5 C) Resp: 16 BP: (!) 148/72 SpO2: 99 %  EBL: None  Complications: No immediate post-treatment complications observed by team, or reported by patient.  Note: The patient tolerated the entire procedure well. A repeat set of vitals were taken after the procedure and the patient was kept under observation following institutional policy, for this type of procedure. Post-procedural neurological assessment was performed, showing return to baseline, prior to discharge. The  patient was provided with post-procedure discharge instructions, including a section on how to identify potential problems. Should any problems arise concerning this procedure, the patient was given instructions to immediately contact us, at any time, without hesitation. In any case, we plan to contact the patient by telephone for a follow-up status report regarding this interventional procedure.  Comments:  No additional relevant information.  Plan of Care  Orders:  Orders Placed This Encounter  Procedures  . Lumbar Epidural Injection    Scheduling Instructions:     Procedure: Interlaminar LESI L3-4     Laterality: Right-sided     Sedation: Patient's choice     Timeframe:  Today    Order Specific Question:   Where will this procedure be performed?    Answer:   [facility]  . Lumbar Transforaminal Epidural    Scheduling Instructions:     Side: Right-sided     Level: S1     Sedation: Patient's choice.     Timeframe: Today    Order Specific Question:   Where will this procedure be performed?    Answer:   [facility]  . [facility] C-ARM 1-60 MIN NO REPORT    Intraoperative interpretation by procedural physician at [facility].    Standing Status:   Standing    Number of Occurrences:   1    Order Specific Question:   Reason for exam:    Answer:   Assistance in needle guidance and placement for procedures requiring needle placement in or near specific anatomical locations not easily accessible without such assistance.  . Informed Consent Details: Physician/Practitioner Attestation; Transcribe to consent form and obtain patient signature    Note: Always confirm laterality of pain with Ms. Ermalinda Barrios, before procedure. Transcribe to consent form and obtain patient signature.    Order Specific Question:   Physician/Practitioner attestation of informed consent for procedure/surgical case    Answer:   I, the physician/practitioner, attest that I have discussed  with the patient the benefits, risks, side effects, alternatives, likelihood of achieving goals and potential problems during recovery for the procedure that I have provided informed consent.    Order Specific Question:   Procedure    Answer:   Lumbar  epidural steroid injection under fluoroscopic guidance    Order Specific Question:   Physician/Practitioner performing the procedure    Answer:   Alinna Siple A. Dossie Arbour, MD    Order Specific Question:   Indication/Reason    Answer:   Low back and/or lower extremity pain secondary to lumbar radiculitis  . Informed Consent Details: Physician/Practitioner Attestation; Transcribe to consent form and obtain patient signature    Provider Attestation: I, [place] Dossie Arbour, MD, (Pain Management Specialist), the physician/practitioner, attest that I have discussed with the patient the benefits, risks, side effects, alternatives, likelihood of achieving goals and potential problems during recovery for the procedure that I have provided informed consent.    Scheduling Instructions:     Note: Always confirm laterality of pain with Ms. Ermalinda Barrios, before procedure.     Transcribe to consent form and obtain patient signature.    Order Specific Question:   Physician/Practitioner attestation of informed consent for procedure/surgical case    Answer:   I, the physician/practitioner, attest that I have discussed with the patient the benefits, risks, side effects, alternatives, likelihood of achieving goals and potential problems during recovery for the procedure that I have provided informed consent.    Order Specific Question:   Procedure    Answer:   Diagnostic lumbar transforaminal epidural steroid injection under fluoroscopic guidance. (See notes for level and laterality.)    Order Specific Question:   Physician/Practitioner performing the procedure    Answer:   Xee Hollman A. Dossie Arbour, MD    Order Specific Question:   Indication/Reason    Answer:   Lumbar  radiculopathy/radiculitis associated with lumbar stenosis  . Provide equipment / supplies at bedside    "Epidural Tray" (Disposable  single use) Catheter: NOT required    Standing Status:   Standing    Number of Occurrences:   1    Order Specific Question:   Specify    Answer:   Epidural Tray   Chronic Opioid Analgesic:  Hydrocodone/APAP 5/325, 1 tab PO q 8 hrs (15 mg/day of hydrocodone) MME/day:15mg /day.   Medications ordered for procedure: Meds ordered this encounter  Medications  . iohexol (OMNIPAQUE) 180 MG/ML injection 10 mL    Must be Myelogram-compatible. If not available, you may substitute with a water-soluble, non-ionic, hypoallergenic, myelogram-compatible radiological contrast medium.  Marland Kitchen lidocaine (XYLOCAINE) 2 % (with pres) injection 400 mg  . DISCONTD: lactated ringers infusion 1,000 mL  . DISCONTD: midazolam (VERSED) 5 MG/5ML injection 1-2 mg    Make sure Flumazenil is available in the pyxis when using this medication. If oversedation occurs, administer 0.2 mg IV over 15 sec. If after 45 sec no response, administer 0.2 mg again over 1 min; may repeat at 1 min intervals; not to exceed 4 doses (1 mg)  . DISCONTD: fentaNYL (SUBLIMAZE) injection 25-50 mcg    Make sure Narcan is available in the pyxis when using this medication. In the event of respiratory depression (RR< 8/min): Titrate NARCAN (naloxone) in increments of 0.1 to 0.2 mg IV at 2-3 minute intervals, until desired degree of reversal.  . sodium chloride flush (NS) 0.9 % injection 2 mL  . ropivacaine (PF) 2 mg/mL (0.2%) (NAROPIN) injection 2 mL  . triamcinolone acetonide (KENALOG-40) injection 40 mg  . sodium chloride flush (NS) 0.9 % injection 2 mL  . ropivacaine (PF) 2 mg/mL (0.2%) (NAROPIN) injection 2 mL  . dexamethasone (DECADRON) injection 10 mg   Medications administered: We administered iohexol, lidocaine, ropivacaine (PF) 2 mg/mL (0.2%), triamcinolone acetonide, ropivacaine (PF)  2 mg/mL (0.2%), and  dexamethasone.  See the medical record for exact dosing, route, and time of administration.  Follow-up plan:   Return in about 2 weeks (around 11/18/2020) for (VV), (PP) Follow-up.       Interventional treatment options:  Under consideration: Diagnosticbilateral cervical facet block  Possiblebilateral cervical facet RFA    Therapeutic/palliative (PRN): Diagnostic right CESI #2  Palliative bilateral trochanteric bursa injection #2  Diagnosticright IA hip joint injection #2  Palliative right lumbar facet block #7  Palliative left lumbar facet block #4  Palliative right lumbar facet RFA #3(last done06/18/19) Palliative left lumbar facet RFA #2 (last done 02/12/2018) Diagnostic/Therapeutic right SI block #2  Diagnostic/Therapeutic left SI block #2  Palliative right S-I RFA #4 (last done 06/05/2019) Palliative right suprascapular muscle TPI/MNB #3  Palliativeright L3-4 LESI#3 Palliative right L5 TFESI #4 Diagnostic right L5-S1 LESI #3  Diagnostic/therapeutic right L4-5 LESI #2 (100/100/80/80)     Recent Visits Date Type Provider Dept  11/03/20 Telemedicine Milinda Pointer, [facility]  09/27/20 Office Visit Milinda Pointer, MD [facility]  Showing recent visits within past 90 days and meeting all other requirements Today's Visits Date Type Provider Dept  11/04/20 Procedure visit Milinda Pointer, MD [facility]  Showing today's visits and meeting all other requirements Future Appointments Date Type Provider Dept  11/18/20 Appointment Milinda Pointer, MD [facility]  01/12/21 Appointment Milinda Pointer, MD [facility]  Showing future appointments within next 90 days and meeting all other requirements  Disposition: Discharge home  Discharge (Date  Time): 11/04/2020; 0848 hrs.   Primary Care Physician: Einar Pheasant, MD Location: [facility] Note by: Gaspar Cola, MD Date: 11/04/2020; Time: 1:05 PM  Disclaimer:  Medicine is not an Chief Strategy Officer. The only guarantee in medicine is that nothing is guaranteed. It is important to note that the decision to proceed with this intervention was based on the information collected from the patient. The Data and conclusions were drawn from the patient's questionnaire, the interview, and the physical examination. Because the information was provided in large part by the patient, it cannot be guaranteed that it has not been purposely or unconsciously manipulated. Every effort has been made to obtain as much relevant data as possible for this evaluation. It is important to note that the conclusions that lead to this procedure are derived in large part from the available data. Always take into account that the treatment will also be dependent on availability of resources and existing treatment guidelines, considered by other Pain Management Practitioners as being common knowledge and practice, at the time of the intervention. For Medico-Legal purposes, it is also important to point out that variation in procedural techniques and pharmacological choices are the acceptable norm. The indications, contraindications, technique, and results of the above procedure should only be interpreted and judged by a Board-Certified Interventional Pain Specialist with extensive familiarity and expertise in the same exact procedure and technique.

## 2020-11-04 NOTE — Patient Instructions (Signed)

## 2020-11-05 ENCOUNTER — Telehealth: Payer: Self-pay

## 2020-11-05 NOTE — Telephone Encounter (Signed)
Post Procedure follow up.  Patient states she is doing well.

## 2020-11-17 ENCOUNTER — Telehealth: Payer: Self-pay | Admitting: *Deleted

## 2020-11-17 NOTE — Telephone Encounter (Signed)
Attempted to call for pre appointment review of allergies/meds. Message left at h ome number, mailbox full at mobile number.

## 2020-11-18 ENCOUNTER — Other Ambulatory Visit: Payer: Self-pay

## 2020-11-18 ENCOUNTER — Encounter: Payer: Self-pay | Admitting: Pain Medicine

## 2020-11-18 ENCOUNTER — Ambulatory Visit: Payer: Medicare Other | Attending: Pain Medicine | Admitting: Pain Medicine

## 2020-11-18 DIAGNOSIS — M545 Low back pain, unspecified: Secondary | ICD-10-CM | POA: Diagnosis not present

## 2020-11-18 DIAGNOSIS — G8929 Other chronic pain: Secondary | ICD-10-CM | POA: Diagnosis not present

## 2020-11-18 DIAGNOSIS — M48062 Spinal stenosis, lumbar region with neurogenic claudication: Secondary | ICD-10-CM

## 2020-11-18 DIAGNOSIS — R937 Abnormal findings on diagnostic imaging of other parts of musculoskeletal system: Secondary | ICD-10-CM | POA: Diagnosis not present

## 2020-11-18 DIAGNOSIS — M47816 Spondylosis without myelopathy or radiculopathy, lumbar region: Secondary | ICD-10-CM

## 2020-11-18 DIAGNOSIS — M79604 Pain in right leg: Secondary | ICD-10-CM | POA: Diagnosis not present

## 2020-11-18 DIAGNOSIS — M4807 Spinal stenosis, lumbosacral region: Secondary | ICD-10-CM

## 2020-11-18 DIAGNOSIS — M5410 Radiculopathy, site unspecified: Secondary | ICD-10-CM | POA: Diagnosis not present

## 2020-11-18 DIAGNOSIS — G894 Chronic pain syndrome: Secondary | ICD-10-CM | POA: Diagnosis not present

## 2020-11-18 DIAGNOSIS — M533 Sacrococcygeal disorders, not elsewhere classified: Secondary | ICD-10-CM | POA: Diagnosis not present

## 2020-11-18 NOTE — Patient Instructions (Signed)
____________________________________________________________________________________________  Preparing for Procedure with Sedation  Procedure appointments are limited to planned procedures: . No Prescription Refills. . No disability issues will be discussed. . No medication changes will be discussed.  Instructions: . Oral Intake: Do not eat or drink anything for at least 8 hours prior to your procedure. (Exception: Blood Pressure Medication. See below.) . Transportation: Unless otherwise stated by your physician, you may drive yourself after the procedure. . Blood Pressure Medicine: Do not forget to take your blood pressure medicine with a sip of water the morning of the procedure. If your Diastolic (lower reading)is above 100 mmHg, elective cases will be cancelled/rescheduled. . Blood thinners: These will need to be stopped for procedures. Notify our staff if you are taking any blood thinners. Depending on which one you take, there will be specific instructions on how and when to stop it. . Diabetics on insulin: Notify the staff so that you can be scheduled 1st case in the morning. If your diabetes requires high dose insulin, take only  of your normal insulin dose the morning of the procedure and notify the staff that you have done so. . Preventing infections: Shower with an antibacterial soap the morning of your procedure. . Build-up your immune system: Take 1000 mg of Vitamin C with every meal (3 times a day) the day prior to your procedure. . Antibiotics: Inform the staff if you have a condition or reason that requires you to take antibiotics before dental procedures. . Pregnancy: If you are pregnant, call and cancel the procedure. . Sickness: If you have a cold, fever, or any active infections, call and cancel the procedure. . Arrival: You must be in the facility at least 30 minutes prior to your scheduled procedure. . Children: Do not bring children with you. . Dress appropriately:  Bring dark clothing that you would not mind if they get stained. . Valuables: Do not bring any jewelry or valuables.  Reasons to call and reschedule or cancel your procedure: (Following these recommendations will minimize the risk of a serious complication.) . Surgeries: Avoid having procedures within 2 weeks of any surgery. (Avoid for 2 weeks before or after any surgery). . Flu Shots: Avoid having procedures within 2 weeks of a flu shots or . (Avoid for 2 weeks before or after immunizations). . Barium: Avoid having a procedure within 7-10 days after having had a radiological study involving the use of radiological contrast. (Myelograms, Barium swallow or enema study). . Heart attacks: Avoid any elective procedures or surgeries for the initial 6 months after a "Myocardial Infarction" (Heart Attack). . Blood thinners: It is imperative that you stop these medications before procedures. Let us know if you if you take any blood thinner.  . Infection: Avoid procedures during or within two weeks of an infection (including chest colds or gastrointestinal problems). Symptoms associated with infections include: Localized redness, fever, chills, night sweats or profuse sweating, burning sensation when voiding, cough, congestion, stuffiness, runny nose, sore throat, diarrhea, nausea, vomiting, cold or Flu symptoms, recent or current infections. It is specially important if the infection is over the area that we intend to treat. . Heart and lung problems: Symptoms that may suggest an active cardiopulmonary problem include: cough, chest pain, breathing difficulties or shortness of breath, dizziness, ankle swelling, uncontrolled high or unusually low blood pressure, and/or palpitations. If you are experiencing any of these symptoms, cancel your procedure and contact your primary care physician for an evaluation.  Remember:  Regular Business hours are:    Monday to Thursday 8:00 AM to 4:00 PM  Provider's  Schedule: Azzie Thiem, MD:  Procedure days: Tuesday and Thursday 7:30 AM to 4:00 PM  Bilal Lateef, MD:  Procedure days: Monday and Wednesday 7:30 AM to 4:00 PM ____________________________________________________________________________________________    

## 2020-11-18 NOTE — Progress Notes (Signed)
Patient: Wanda Martinez  Service Category: E/M  Provider: Gaspar Cola, MD  DOB: [birth year]  DOS: 11/18/2020  Location: Office  MRN: [id number]  Setting: Ambulatory outpatient  Referring Provider: Einar Pheasant, MD  Type: Established Patient  Specialty: Interventional Pain Management  PCP: Einar Pheasant, MD  Location: Remote location  Delivery: TeleHealth     Virtual Encounter - Pain Management PROVIDER NOTE: Information contained herein reflects review and annotations entered in association with encounter. Interpretation of such information and data should be left to medically-trained personnel. Information provided to patient can be located elsewhere in the medical record under "Patient Instructions". Document created using STT-dictation technology, any transcriptional errors that may result from process are unintentional.    Contact & Pharmacy Preferred: [phone] Home: [phone] (home) Mobile: [phone] (mobile) E-mail: No e-mail address on record  [place], [place] - [address] [place] [address] Phone: [phone] Fax: [phone]   Pre-screening  [name] offered "in-person" vs "virtual" encounter. She indicated preferring virtual for this encounter.   Reason COVID-19*  Social distancing based on CDC and AMA recommendations.   I contacted Wanda Martinez on 11/18/2020 via telephone.      I clearly identified myself as Gaspar Cola, MD. I verified that I was speaking with the correct person using two identifiers (Name: Wanda Martinez, and date of birth: Jun 11, 1935).  Consent I sought verbal advanced consent from Wanda Martinez for virtual visit interactions. I informed Wanda Martinez of possible security and privacy concerns, risks, and limitations associated with providing "not-in-person" medical evaluation and management services. I also informed Wanda Martinez of the availability of  "in-person" appointments. Finally, I informed her that there would be a charge for the virtual visit and that she could be  personally, fully or partially, financially responsible for it. Ms. Hollopeter expressed understanding and agreed to proceed.   Historic Elements   Wanda Martinez is a 85 y.o. year old, female patient evaluated today after our last contact on 11/04/2020. [name]  has a past medical history of Acute postoperative pain (10/25/2017), Anemia, Arm pain (07/26/2015), Arthritis, Arthritis, degenerative (03/26/2014), Back pain (11/01/2013), Bladder infection (06/2018), Breast cancer ([place]), Breast cancer ([place]), CHEST PAIN (04/29/2010), Chronic cystitis, Cystocele (02/22/2013), Cystocele, midline (08/19/2013), Degeneration of intervertebral disc of lumbosacral region (03/26/2014), DYSPNEA (04/29/2010), Enthesopathy of hip (03/26/2014), GERD (gastroesophageal reflux disease), Hiatal hernia, HTN (hypertension), Hypokalemia (06/2018), Hyponatremia (06/2018), LBP (low back pain) (03/26/2014), Neck pain (11/01/2013), Parkinson disease ([place]), Pneumonia (06/2018), Sinusitis (02/07/2015), Skin lesions (07/12/2014), and Urinary incontinence. She also  has a past surgical history that includes Tonsillectomy and adenoidectomy (81); Vesicovaginal fistula closure w/ TAH (1983); Breast surgery (1986); Breast enhancement surgery (1987); Breast implant removal; Breast implant removal (Right, 08/29/2012); Mastectomy (08/2012); Abdominal hysterectomy; Colonoscopy with propofol (N/A, 09/13/2016); and Breast biopsy (2013). Wanda Martinez has a current medication list which includes the following prescription(s): amlodipine, aspirin, calcium carbonate, citalopram, cyclobenzaprine, donepezil, gabapentin, glucosamine sulfate, hydrochlorothiazide, hydrocodone-acetaminophen, [START ON 11/23/2020] hydrocodone-acetaminophen, [START ON 12/23/2020] hydrocodone-acetaminophen, letrozole, losartan, lubiprostone, magnesium oxide, meloxicam, metoprolol  succinate, multivitamin, nystatin, omega-3, omeprazole, pantoprazole, and pravastatin. She  reports that she has never smoked. She has never used smokeless tobacco. She reports that she does not drink alcohol and does not use drugs. [name] is allergic to sulfa antibiotics and vesicare [solifenacin].   HPI  Today, she is being contacted for a post-procedure assessment.  According to the patient, this last procedure did not provide  her with the relief that she was hoping to get.  She indicated that the prior procedure, referring to the right L4-5 TFESI, had provided her with better relief.  I did a little bit more inquiring about this and it turns out that right now her worst pain is in the lower back, on the right side.  The pain is all across the lower back, but the right side is much worse.  She describes the pain goes to the hips but when I asked her to describe to me what she was calling the hips, it turns out to be the upper buttocks area around the PSIS area.  She states that right now she is not having much lower extremity pain which is likely what we set out to achieve when we did the LESI/TFESI treatments.  Today I took the time to review the patient's last MRI done in January of this year.  FINDINGS: Alignment:  Curvature convex to the right with the apex at L2. Schmorl's node on the left at L2 with some edema.  Levels: T11-12, T12-L1 and L1-2: Mild noncompressive disc bulges. L2-3: Disc degeneration more pronounced on the left. Foraminal narrowing on the left that would have some potential to affect the exiting L2 nerve. This has worsened slightly since 2018. L3-4: Endplate osteophytes and bulging of the disc. Mild facet hypertrophy. Mild stenosis of the right lateral recess but without gross neural compression. L4-5: Endplate osteophytes and bulging of the disc. Facet degeneration and hypertrophy on the right. Mild narrowing of the intervertebral foramen on the right, not grossly  compressive. L5-S1: Bulging of the disc more prominent towards the right. Facet degeneration and hypertrophy more pronounced on the right. Foraminal narrowing on the right that could possibly affect the exiting L5 nerve. This has worsened slightly since 2018.  IMPRESSION: 1. Curvature convex to the right with the apex at L2 as seen previously. 2. Slight worsening of disc degeneration on the left side at L2-3. L2 Schmorl's node with edema that could contribute to back pain. Left foraminal encroachment secondary to osteophytes and bulging disc material would have some potential to affect the exiting L2 nerve. 3. Slight worsening of degenerative changes on the right at L5-S1. Bulging of the disc. Facet degeneration and hypertrophy. Right foraminal narrowing that could possibly affect the right L5 nerve. The facet arthropathy could also be a cause of back pain or referred facet syndrome pain. Lesser facet osteoarthritis on the left.  Although the patient has multiple areas of degeneration, it is evident that there is multilevel, bilateral facet degeneration and hypertrophy, which is likely to be involved in the pain that she is experiencing in the lower back.  The past we have done lumbar facet blocks and the patient on follow-up as reported 100% relief of the pain for the duration of the local anesthetic followed by 100% relief of the pain that has lasted up to 5 days.  In the past we also treated these with lumbar facet radiofrequency ablation.  The patient's last right-sided lumbar facet radiofrequency ablation was done on 06/04/2018.  It is important to also noticed that she has had problems with the SI joint and we have also treated those with radiofrequency ablation.  The last time that she had an RFA of the right SI joint was on 06/05/2019, 1-1/2 years ago.  I suspect that the patient's current increase of the pain is secondary to the lumbar and SI joint radiofrequency's wearing off.  Gabapentin  assessment:  Post-Procedure Evaluation  Procedure (11/04/2020): Diagnostic/therapeutic right S1 TFESI #1 + right L3-4 LESI #1 under fluoroscopic guidance, no sedation Pre-procedure pain level: 6/10 Post-procedure: 0/10 (100% relief)  Sedation: None.  Effectiveness during initial hour after procedure(Ultra-Short Term Relief): 100 %.  Local anesthetic used: Long-acting (4-6 hours) Effectiveness: Defined as any analgesic benefit obtained secondary to the administration of local anesthetics. This carries significant diagnostic value as to the etiological location, or anatomical origin, of the pain. Duration of benefit is expected to coincide with the duration of the local anesthetic used.  Effectiveness during initial 4-6 hours after procedure(Short-Term Relief): 100 %.  Long-term benefit: Defined as any relief past the pharmacologic duration of the local anesthetics.  Effectiveness past the initial 6 hours after procedure(Long-Term Relief): 20 % (pt states, she only got little relief, the last procedure worked the best).  (Referring to the right L4 TFESI   Current benefits: Defined as benefit that persist at this time.   Analgesia:  <50% better Function: Back to baseline ROM: Back to baseline  Pharmacotherapy Assessment  Analgesic: Hydrocodone/APAP 5/325, 1 tab PO q 8 hrs (15 mg/day of hydrocodone) MME/day:15mg /day.   Monitoring: [place] PMP: PDMP reviewed during this encounter.       Pharmacotherapy: No side-effects or adverse reactions reported. Compliance: No problems identified. Effectiveness: Clinically acceptable. Plan: Refer to "POC".  UDS:  Summary  Date Value Ref Range Status  07/07/2020 Note  Final    Comment:    ==================================================================== [employer] 13 (MW) ==================================================================== Test                             Result       Flag       Units  Drug Present and Declared for  Prescription Verification   Hydrocodone                    389          EXPECTED   ng/mg creat   Dihydrocodeine                 131          EXPECTED   ng/mg creat   Norhydrocodone                 1443         EXPECTED   ng/mg creat    Sources of hydrocodone include scheduled prescription medications.    Dihydrocodeine and norhydrocodone are expected metabolites of    hydrocodone. Dihydrocodeine is also available as a scheduled    prescription medication.  ==================================================================== Test                      Result    Flag   Units      Ref Range   Creatinine              102              mg/dL      >=20 ==================================================================== Declared Medications:  The flagging and interpretation on this report are based on the  following declared medications.  Unexpected results may arise from  inaccuracies in the declared medications.   **Note: The testing scope of this panel includes these medications:   Hydrocodone (Norco)   **Note: The testing scope of this panel does not include the  following reported medications:   Acetaminophen (Norco)  Amlodipine (Norvasc)  Aspirin  Calcium  Citalopram (Celexa)  Cyclobenzaprine (Flexeril)  Donepezil (Aricept)  Gabapentin (Neurontin)  Hydrochlorothiazide (Hydrodiuril)  Letrozole (Femara)  Losartan (Cozaar)  Lubiprostone (Amitiza)  Magnesium (Mag-Ox)  Meloxicam (Mobic)  Metoprolol (Toprol)  Multivitamin  Nitrofurantoin (Macrobid)  Nystatin (Mycostatin)  Omega-3 Fatty Acids  Omeprazole (Prilosec)  Pantoprazole (Protonix)  Pravastatin (Pravachol) ==================================================================== For clinical consultation, please call [phone]. ====================================================================     Laboratory Chemistry Profile   Renal Lab Results  Component Value Date   BUN 18 07/27/2020   CREATININE 0.99  07/27/2020   BCR 20 06/11/2020   GFR 44.26 (L) 01/05/2020   GFRAA >60 07/27/2020   GFRNONAA 53 (L) 07/27/2020     Hepatic Lab Results  Component Value Date   AST 20 06/11/2020   ALT 19 06/11/2020   ALBUMIN 4.4 10/17/2019   ALKPHOS 50 10/17/2019     Electrolytes Lab Results  Component Value Date   NA 137 07/27/2020   K 3.8 07/27/2020   CL 99 07/27/2020   CALCIUM 9.4 07/27/2020   MG 2.2 07/17/2018     Bone Lab Results  Component Value Date   25OHVITD1 42 07/17/2018   25OHVITD2 <1.0 07/17/2018   25OHVITD3 42 07/17/2018     Inflammation (CRP: Acute Phase) (ESR: Chronic Phase) Lab Results  Component Value Date   CRP 3 07/17/2018   ESRSEDRATE 27 07/17/2018       Note: Above Lab results reviewed.  Imaging  DG PAIN CLINIC C-ARM 1-60 MIN NO REPORT Fluoro was used, but no Radiologist interpretation will be provided.  Please refer to "NOTES" tab for provider progress note.  Assessment  The primary encounter diagnosis was Chronic pain syndrome. Diagnoses of Chronic low back pain (1ry area of Pain) (Bilateral) (R>L), Chronic lower extremity pain (2ry area of Pain) (Right), Chronic lumbar radicular pain (S1) (Right), Lumbar spinal stenosis (with neurogenic claudication) (L3-4), 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, L4-5, L5-S1) (Right), Lumbar facet syndrome (Bilateral) (R>L), Lumbar facet hypertrophy (Multilevel) (Bilateral), Abnormal MRI, lumbar spine (12/30/2019), and Chronic sacroiliac joint pain (Right) were also pertinent to this visit.  Plan of Care  Problem-specific:  No problem-specific Assessment & Plan notes found for this encounter.  Ms. Caoimhe Damron has a current medication list which includes the following long-term medication(s): amlodipine, calcium carbonate, cyclobenzaprine, donepezil, gabapentin, hydrochlorothiazide, hydrocodone-acetaminophen, [START ON 11/23/2020] hydrocodone-acetaminophen, [START ON 12/23/2020] hydrocodone-acetaminophen, losartan,  magnesium oxide, meloxicam, metoprolol succinate, omeprazole, pantoprazole, and pravastatin.  Pharmacotherapy (Medications Ordered): No orders of the defined types were placed in this encounter.  Orders:  Orders Placed This Encounter  Procedures  . LUMBAR FACET(MEDIAL BRANCH NERVE BLOCK) MBNB    Standing Status:   Future    Standing Expiration Date:   12/19/2020    Scheduling Instructions:     Procedure: Lumbar facet block (AKA.: Lumbosacral medial branch nerve block)     Side: Bilateral     Level: L3-4, L4-5, & L5-S1 Facets (L2, L3, L4, L5, & S1 Medial Branch Nerves)     Sedation: Patient's choice.     Timeframe: ASAA    Order Specific Question:   Where will this procedure be performed?    Answer:   [facility]  . SACROILIAC JOINT INJECTION    Standing Status:   Future    Standing Expiration Date:   12/19/2020    Scheduling Instructions:     Side: Right-sided     Sedation: Patient's choice.     Timeframe: ASAP    Order Specific Question:   Where will this  procedure be performed?    Answer:   [facility]   Follow-up plan:   Return for Procedure (w/ sedation): (R) L-FCT BLK + (R) SI BLK.      Interventional Therapies  Risk  Complexity Considerations:   WNL   Planned  Pending:   Pending further evaluation   Under consideration:   Diagnosticbilateral cervical facet block  Possiblebilateral cervical facet RFA    Completed:   Therapeutic right thoracic back TPI x2 (10/17/2018)  Therapeutic left thoracic back TPI x1 (10/17/2018) Therapeutic right quadratus lumborum and erector spinae muscle TPI x1 (02/12/2018) Diagnostic right lumbar facet block x3 (11/06/2019) (100/100/100 x3 days) Diagnostic left lumbar facet block x2 (07/11/2017) (100/100/R-95, L-0/90) Therapeutic/palliative right lumbar facet block x3 (03/28/2018) (100/100/0/<25) Therapeutic/palliative left lumbar facet block x1 (08/09/2017) (100/90/20/<25) Therapeutic right lumbar facet RFA x2  (06/04/2018) (100/90/75/75-100) Therapeutic left lumbar facet RFA x1 (02/12/2018)  Diagnostic right SI joint block x1 (03/18/2020) (75/75/0) Therapeutic right SI joint RFA x3 (06/05/2019)  Therapeutic midline L2-3 LESI x1 (10/04/2017) (100/100/85/>50) Therapeutic right L3-4 LESI x2 (11/04/2020) Therapeutic right L4-5 LESI x1 (05/20/2020) Therapeutic right L5-S1 LESI x2 (12/04/2019) Therapeutic right L5 TFESI x3 (12/04/2019)  Therapeutic right S1 TFESI x1 (11/04/2020) Diagnostic bilateral superficial trochanteric bursa injection x1 (04/30/2018)  Diagnostic right IA hip injection x1 (02/13/2019)    Palliative options:   None at this time    Recent Visits Date Type Provider Dept  11/04/20 Procedure visit Milinda Pointer, MD [facility]  11/03/20 Telemedicine Milinda Pointer, MD [facility]  09/27/20 Office Visit Milinda Pointer, MD [facility]  Showing recent visits within past 90 days and meeting all other requirements Today's Visits Date Type Provider Dept  11/18/20 Telemedicine Milinda Pointer, MD [facility]  Showing today's visits and meeting all other requirements Future Appointments Date Type Provider Dept  01/12/21 Appointment Milinda Pointer, MD [facility]  Showing future appointments within next 90 days and meeting all other requirements  I discussed the assessment and treatment plan with the patient. The patient was provided an opportunity to ask questions and all were answered. The patient agreed with the plan and demonstrated an understanding of the instructions.  Patient advised to call back or seek an in-person evaluation if the symptoms or condition worsens.  Duration of encounter: 18 minutes.  Note by: Gaspar Cola, MD Date: 11/18/2020; Time: 4:46 PM

## 2020-11-25 ENCOUNTER — Ambulatory Visit: Payer: Medicare Other | Admitting: Pain Medicine

## 2020-11-29 DIAGNOSIS — Z23 Encounter for immunization: Secondary | ICD-10-CM | POA: Diagnosis not present

## 2020-12-02 DIAGNOSIS — E669 Obesity, unspecified: Secondary | ICD-10-CM | POA: Diagnosis not present

## 2020-12-02 DIAGNOSIS — I10 Essential (primary) hypertension: Secondary | ICD-10-CM | POA: Diagnosis not present

## 2020-12-02 DIAGNOSIS — R002 Palpitations: Secondary | ICD-10-CM | POA: Diagnosis not present

## 2020-12-02 DIAGNOSIS — E7800 Pure hypercholesterolemia, unspecified: Secondary | ICD-10-CM | POA: Diagnosis not present

## 2020-12-09 ENCOUNTER — Other Ambulatory Visit: Payer: Self-pay | Admitting: Internal Medicine

## 2020-12-09 DIAGNOSIS — M7918 Myalgia, other site: Secondary | ICD-10-CM

## 2020-12-21 ENCOUNTER — Ambulatory Visit: Payer: Medicare Other | Admitting: Pain Medicine

## 2020-12-21 ENCOUNTER — Encounter: Payer: Self-pay | Admitting: Pain Medicine

## 2020-12-21 ENCOUNTER — Ambulatory Visit
Admission: RE | Admit: 2020-12-21 | Discharge: 2020-12-21 | Disposition: A | Discharge status: Home / Self Care | Payer: Medicare Other | Source: Ambulatory Visit | Attending: Pain Medicine | Admitting: Pain Medicine

## 2020-12-21 ENCOUNTER — Other Ambulatory Visit: Payer: Self-pay

## 2020-12-21 VITALS — BP 151/83 | HR 55 | Temp 97.1°F | Resp 18 | Ht 64.0 in | Wt 185.0 lb

## 2020-12-21 DIAGNOSIS — M461 Sacroiliitis, not elsewhere classified: Secondary | ICD-10-CM | POA: Insufficient documentation

## 2020-12-21 DIAGNOSIS — M545 Low back pain, unspecified: Secondary | ICD-10-CM | POA: Insufficient documentation

## 2020-12-21 DIAGNOSIS — G894 Chronic pain syndrome: Secondary | ICD-10-CM | POA: Diagnosis present

## 2020-12-21 DIAGNOSIS — M533 Sacrococcygeal disorders, not elsewhere classified: Secondary | ICD-10-CM

## 2020-12-21 DIAGNOSIS — M5137 Other intervertebral disc degeneration, lumbosacral region: Secondary | ICD-10-CM | POA: Insufficient documentation

## 2020-12-21 DIAGNOSIS — G8929 Other chronic pain: Secondary | ICD-10-CM | POA: Diagnosis present

## 2020-12-21 DIAGNOSIS — M47817 Spondylosis without myelopathy or radiculopathy, lumbosacral region: Secondary | ICD-10-CM | POA: Diagnosis present

## 2020-12-21 DIAGNOSIS — M5388 Other specified dorsopathies, sacral and sacrococcygeal region: Secondary | ICD-10-CM

## 2020-12-21 DIAGNOSIS — M47816 Spondylosis without myelopathy or radiculopathy, lumbar region: Secondary | ICD-10-CM

## 2020-12-21 DIAGNOSIS — M9904 Segmental and somatic dysfunction of sacral region: Secondary | ICD-10-CM | POA: Insufficient documentation

## 2020-12-21 DIAGNOSIS — M792 Neuralgia and neuritis, unspecified: Secondary | ICD-10-CM

## 2020-12-21 MED ORDER — MIDAZOLAM HCL 5 MG/5ML IJ SOLN: 1.0000 mg | INTRAMUSCULAR | Status: DC | PRN

## 2020-12-21 MED ORDER — HYDROCODONE-ACETAMINOPHEN 5-325 MG PO TABS: 1.0000 | ORAL_TABLET | Freq: Three times a day (TID) | ORAL | 0 refills | Status: DC | PRN

## 2020-12-21 MED ORDER — ROPIVACAINE HCL 2 MG/ML IJ SOLN
INTRAMUSCULAR | Status: AC
  Filled 2020-12-21: qty 10

## 2020-12-21 MED ORDER — LIDOCAINE HCL 2 % IJ SOLN
INTRAMUSCULAR | Status: AC
  Filled 2020-12-21: qty 20

## 2020-12-21 MED ORDER — ROPIVACAINE HCL 2 MG/ML IJ SOLN
4.0000 mL | Freq: Once | INTRAMUSCULAR | Status: AC
  Administered 2020-12-21: 4 mL via INTRA_ARTICULAR

## 2020-12-21 MED ORDER — LACTATED RINGERS IV SOLN: 1000.0000 mL | Freq: Once | INTRAVENOUS | Status: DC

## 2020-12-21 MED ORDER — METHYLPREDNISOLONE ACETATE 80 MG/ML IJ SUSP
INTRAMUSCULAR | Status: AC
  Filled 2020-12-21: qty 1

## 2020-12-21 MED ORDER — METHYLPREDNISOLONE ACETATE 80 MG/ML IJ SUSP
80.0000 mg | Freq: Once | INTRAMUSCULAR | Status: AC
  Administered 2020-12-21: 80 mg via INTRA_ARTICULAR

## 2020-12-21 MED ORDER — FENTANYL CITRATE (PF) 100 MCG/2ML IJ SOLN: 25.0000 ug | INTRAMUSCULAR | Status: DC | PRN

## 2020-12-21 MED ORDER — TRIAMCINOLONE ACETONIDE 40 MG/ML IJ SUSP
INTRAMUSCULAR | Status: AC
  Filled 2020-12-21: qty 1

## 2020-12-21 MED ORDER — LIDOCAINE HCL 2 % IJ SOLN
20.0000 mL | Freq: Once | INTRAMUSCULAR | Status: AC
  Administered 2020-12-21: 400 mg

## 2020-12-21 MED ORDER — GABAPENTIN 100 MG PO CAPS: ORAL_CAPSULE | ORAL | 0 refills | Status: DC

## 2020-12-21 MED ORDER — TRIAMCINOLONE ACETONIDE 40 MG/ML IJ SUSP
40.0000 mg | Freq: Once | INTRAMUSCULAR | Status: AC
  Administered 2020-12-21: 40 mg

## 2020-12-21 MED ORDER — ROPIVACAINE HCL 2 MG/ML IJ SOLN
9.0000 mL | Freq: Once | INTRAMUSCULAR | Status: AC
  Administered 2020-12-21: 9 mL via PERINEURAL

## 2020-12-21 NOTE — Patient Instructions (Signed)

## 2020-12-21 NOTE — Progress Notes (Signed)
PROVIDER NOTE: Information contained herein reflects review and annotations entered in association with encounter. Interpretation of such information and data should be left to medically-trained personnel. Information provided to patient can be located elsewhere in the medical record under "Patient Instructions". Document created using STT-dictation technology, any transcriptional errors that may result from process are unintentional.    Patient: Wanda Martinez  Service Category: Procedure  Provider: Gaspar Cola, MD  DOB: [birth year]  DOS: 12/21/2020  Location: [facility]  MRN: [id number]  Setting: Ambulatory - outpatient  Referring Provider: Einar Pheasant, MD  Type: Established Patient  Specialty: Interventional Pain Management  PCP: Einar Pheasant, MD   Primary Reason for Visit: Interventional Pain Management Treatment. CC: Back Pain (Right lumbar )  Procedure:          Anesthesia, Analgesia, Anxiolysis:  Procedure #1: Type: Medial Branch Facet Block #4 Primary Purpose: Palliative Region: Lumbar Level: L2, L3, L4, L5, & S1 Medial Branch Level(s) Target Area: For Lumbar Facet blocks, the target is the groove formed by the junction of the transverse process and superior articular process. For the L5 dorsal ramus, the target is the notch between superior articular process and sacral ala. For the S1 dorsal ramus, the target is the superior and lateral edge of the posterior S1 Sacral foramen. Approach: Posterior, paramedial, percutaneous approach. Laterality: Right  Procedure #2: Type: Sacroiliac Joint Block  #2  Primary Purpose: Diagnostic/Therapeutric Region: Posterior Lumbosacral Level: PSIS (Posterior Superior Iliac Spine) Sacroiliac Joint Target Area: For upper sacroiliac joint block(s), the target is the superior and posterior margin of the sacroiliac joint. Approach: Ipsilateral approach. Laterality: Right  Type: Local Anesthesia Indication(s): Analgesia          Route: Infiltration ([place]/IM) IV Access: Declined Sedation: Declined  Local Anesthetic: Lidocaine 1-2%  Position: Prone   Indications: 1. Lumbar facet syndrome (Bilateral) (R>L)   2. Spondylosis without myelopathy or radiculopathy, lumbosacral region   3. Lumbar facet hypertrophy (Multilevel) (Bilateral)   4. DDD (degenerative disc disease), lumbosacral   5. Chronic sacroiliac joint pain (Right)   6. Other specified dorsopathies, sacral and sacrococcygeal region   7. Osteoarthritis of sacroiliac joint (Right)   8. Sacroiliac joint dysfunction (Right)   9. Somatic dysfunction of sacroiliac joint (Right)    Pain Score: Pre-procedure: 7 /10 Post-procedure: 0-No pain/10   R: 04/22/2021  Transfer nonopioids: Neurontin  Pre-op H&P Assessment:  Wanda Martinez is a 86 y.o. (year old), female patient, seen today for interventional treatment. She  has a past surgical history that includes Tonsillectomy and adenoidectomy (81); Vesicovaginal fistula closure w/ TAH (1983); Breast surgery (1986); Breast enhancement surgery (1987); Breast implant removal; Breast implant removal (Right, 08/29/2012); Mastectomy (08/2012); Abdominal hysterectomy; Colonoscopy with propofol (N/A, 09/13/2016); and Breast biopsy (2013). [name] has a current medication list which includes the following prescription(s): amlodipine, aspirin, calcium carbonate, citalopram, cyclobenzaprine, donepezil, glucosamine sulfate, hydrochlorothiazide, [START ON 12/23/2020] hydrocodone-acetaminophen, letrozole, losartan, lubiprostone, magnesium oxide, meloxicam, metoprolol succinate, multivitamin, nystatin, omega-3, omeprazole, pantoprazole, pravastatin, [START ON 12/26/2020] gabapentin, [START ON 01/22/2021] hydrocodone-acetaminophen, [START ON 02/21/2021] hydrocodone-acetaminophen, and [START ON 03/23/2021] hydrocodone-acetaminophen. Her primarily concern today is the Back Pain (Right lumbar )  Initial Vital Signs:  Pulse/HCG Rate: (!) 55ECG  Heart Rate: (!) 56 Temp: (!) 97.1 F (36.2 C) Resp: 16 BP: 131/81 SpO2: 100 %  BMI: Estimated body mass index is 31.76 kg/m as calculated from the following:   Height as of this encounter: 5\' 4"  (1.626 m).   Weight as of  this encounter: 185 lb (83.9 kg).  Risk Assessment: Allergies: Reviewed. She is allergic to sulfa antibiotics and vesicare [solifenacin].  Allergy Precautions: None required Coagulopathies: Reviewed. None identified.  Blood-thinner therapy: None at this time Active Infection(s): Reviewed. None identified. [name] is afebrile  Site Confirmation: Ms. Delacruz was asked to confirm the procedure and laterality before marking the site Procedure checklist: Completed Consent: Before the procedure and under the influence of no sedative(s), amnesic(s), or anxiolytics, the patient was informed of the treatment options, risks and possible complications. To fulfill our ethical and legal obligations, as recommended by the American Medical Association's Code of Ethics, I have informed the patient of my clinical impression; the nature and purpose of the treatment or procedure; the risks, benefits, and possible complications of the intervention; the alternatives, including doing nothing; the risk(s) and benefit(s) of the alternative treatment(s) or procedure(s); and the risk(s) and benefit(s) of doing nothing. The patient was provided information about the general risks and possible complications associated with the procedure. These may include, but are not limited to: failure to achieve desired goals, infection, bleeding, organ or nerve damage, allergic reactions, paralysis, and death. In addition, the patient was informed of those risks and complications associated to Spine-related procedures, such as failure to decrease pain; infection (i.e.: Meningitis, epidural or intraspinal abscess); bleeding (i.e.: epidural hematoma, subarachnoid hemorrhage, or any other type of intraspinal or  peri-dural bleeding); organ or nerve damage (i.e.: Any type of peripheral nerve, nerve root, or spinal cord injury) with subsequent damage to sensory, motor, and/or autonomic systems, resulting in permanent pain, numbness, and/or weakness of one or several areas of the body; allergic reactions; (i.e.: anaphylactic reaction); and/or death. Furthermore, the patient was informed of those risks and complications associated with the medications. These include, but are not limited to: allergic reactions (i.e.: anaphylactic or anaphylactoid reaction(s)); adrenal axis suppression; blood sugar elevation that in diabetics may result in ketoacidosis or comma; water retention that in patients with history of congestive heart failure may result in shortness of breath, pulmonary edema, and decompensation with resultant heart failure; weight gain; swelling or edema; medication-induced neural toxicity; particulate matter embolism and blood vessel occlusion with resultant organ, and/or nervous system infarction; and/or aseptic necrosis of one or more joints. Finally, the patient was informed that Medicine is not an exact science; therefore, there is also the possibility of unforeseen or unpredictable risks and/or possible complications that may result in a catastrophic outcome. The patient indicated having understood very clearly. We have given the patient no guarantees and we have made no promises. Enough time was given to the patient to ask questions, all of which were answered to the patient's satisfaction. Ms. Nawabi has indicated that she wanted to continue with the procedure. Attestation: I, the ordering provider, attest that I have discussed with the patient the benefits, risks, side-effects, alternatives, likelihood of achieving goals, and potential problems during recovery for the procedure that I have provided informed consent. Date  Time: 12/21/2020  8:37 AM  Pre-Procedure Preparation:  Monitoring: As per clinic  protocol. Respiration, ETCO2, SpO2, BP, heart rate and rhythm monitor placed and checked for adequate function Safety Precautions: Patient was assessed for positional comfort and pressure points before starting the procedure. Time-out: I initiated and conducted the "Time-out" before starting the procedure, as per protocol. The patient was asked to participate by confirming the accuracy of the "Time Out" information. Verification of the correct person, site, and procedure were performed and confirmed by me, the nursing staff,  and the patient. "Time-out" conducted as per Joint Commission's Universal Protocol (UP.01.01.01). Time: 77  Description of Procedure #1:   Time-out: "Time-out" completed before starting procedure, as per protocol. Area Prepped: Entire Posterior Lumbosacral Region DuraPrep (Iodine Povacrylex [0.7% available iodine] and Isopropyl Alcohol, 74% w/w) Safety Precautions: Aspiration looking for blood return was conducted prior to all injections. At no point did we inject any substances, as a needle was being advanced. No attempts were made at seeking any paresthesias. Safe injection practices and needle disposal techniques used. Medications properly checked for expiration dates. SDV (single dose vial) medications used.  Description of the Procedure: Protocol guidelines were followed. The patient was placed in position over the fluoroscopy table. The target area was identified and the area prepped in the usual manner. Skin & deeper tissues infiltrated with local anesthetic. Appropriate amount of time allowed to pass for local anesthetics to take effect. The procedure needle was introduced through the skin, ipsilateral to the reported pain, and advanced to the target area. Employing the "Medial Branch Technique", the needles were advanced to the angle made by the superior and medial portion of the transverse process, and the lateral and inferior portion of the superior articulating process of  the targeted vertebral bodies. This area is known as "Burton's Eye" or the "Eye of the [place] Dog". A procedure needle was introduced through the skin, and this time advanced to the angle made by the superior and medial border of the sacral ala, and the lateral border of the S1 vertebral body. This last needle was later repositioned at the superior and lateral border of the posterior S1 foramen. Negative aspiration confirmed. Solution injected in intermittent fashion, asking for systemic symptoms every 0.5cc of injectate. The needles were then removed and the area cleansed, making sure to leave some of the prepping solution back to take advantage of its long term bactericidal properties. Start Time: 0859 hrs. Materials:  Needle(s) Type: Spinal Needle Gauge: 22G Length: 3.5-in Medication(s): Please see orders for medications and dosing details.  Description of Procedure # 2:   Area Prepped: Entire Posterior Lumbosacral Region DuraPrep (Iodine Povacrylex [0.7% available iodine] and Isopropyl Alcohol, 74% w/w) Safety Precautions: Aspiration looking for blood return was conducted prior to all injections. At no point did we inject any substances, as a needle was being advanced. No attempts were made at seeking any paresthesias. Safe injection practices and needle disposal techniques used. Medications properly checked for expiration dates. SDV (single dose vial) medications used. Description of the Procedure: Protocol guidelines were followed. The patient was placed in position over the fluoroscopy table. The target area was identified and the area prepped in the usual manner. Skin & deeper tissues infiltrated with local anesthetic. Appropriate amount of time allowed to pass for local anesthetics to take effect. The procedure needle was advanced under fluoroscopic guidance into the sacroiliac joint until a firm endpoint was obtained. Proper needle placement secured. Negative aspiration confirmed. Solution  injected in intermittent fashion, asking for systemic symptoms every 0.5cc of injectate. The needles were then removed and the area cleansed, making sure to leave some of the prepping solution back to take advantage of its long term bactericidal properties. Vitals:   12/21/20 0855 12/21/20 0900 12/21/20 0904 12/21/20 0910  BP: (!) 161/82 (!) 159/84 (!) 163/90 (!) 151/83  Pulse:      Resp: 16 14 16 18   Temp:      SpO2: 100% 100% 99% 99%  Weight:      Height:  End Time: 0907 hrs. Materials:  Needle(s) Type: Spinal Needle Gauge: 22G Length: 3.5-in Medication(s): Please see orders for medications and dosing details.  Imaging Guidance (Spinal):          Type of Imaging Technique: Fluoroscopy Guidance (Spinal) Indication(s): Assistance in needle guidance and placement for procedures requiring needle placement in or near specific anatomical locations not easily accessible without such assistance. Exposure Time: Please see nurses notes. Contrast: None used. Fluoroscopic Guidance: I was personally present during the use of fluoroscopy. "Tunnel Vision Technique" used to obtain the best possible view of the target area. Parallax error corrected before commencing the procedure. "Direction-depth-direction" technique used to introduce the needle under continuous pulsed fluoroscopy. Once target was reached, antero-posterior, oblique, and lateral fluoroscopic projection used confirm needle placement in all planes. Images permanently stored in EMR. Interpretation: No contrast injected. I personally interpreted the imaging intraoperatively. Adequate needle placement confirmed in multiple planes. Permanent images saved into the patient's record.  Antibiotic Prophylaxis:   Anti-infectives (From admission, onward)   None     Indication(s): None identified  Post-operative Assessment:  Post-procedure Vital Signs:  Pulse/HCG Rate: (!) 55(!) 56 Temp: (!) 97.1 F (36.2 C) Resp: 18 BP: (!)  151/83 SpO2: 99 %  EBL: None  Complications: No immediate post-treatment complications observed by team, or reported by patient.  Note: The patient tolerated the entire procedure well. A repeat set of vitals were taken after the procedure and the patient was kept under observation following institutional policy, for this type of procedure. Post-procedural neurological assessment was performed, showing return to baseline, prior to discharge. The patient was provided with post-procedure discharge instructions, including a section on how to identify potential problems. Should any problems arise concerning this procedure, the patient was given instructions to immediately contact us, at any time, without hesitation. In any case, we plan to contact the patient by telephone for a follow-up status report regarding this interventional procedure.  Comments:  No additional relevant information.  Plan of Care  Orders:  Orders Placed This Encounter  Procedures  . LUMBAR FACET(MEDIAL BRANCH NERVE BLOCK) MBNB    Scheduling Instructions:     Procedure: Lumbar facet block (AKA.: Lumbosacral medial branch nerve block)     Side: Right-sided     Level: L3-4, L4-5, & L5-S1 Facets (L2, L3, L4, L5, & S1 Medial Branch Nerves)     Sedation: Patient's choice.     Timeframe: Today    Order Specific Question:   Where will this procedure be performed?    Answer:   [facility]  . SACROILIAC JOINT INJECTION    Scheduling Instructions:     Side: Right-sided     Sedation: Patient's choice.     Timeframe: Today    Order Specific Question:   Where will this procedure be performed?    Answer:   [facility]  . DG PAIN CLINIC C-ARM 1-60 MIN NO REPORT    Intraoperative interpretation by procedural physician at [place].    Standing Status:   Standing    Number of Occurrences:   1    Order Specific Question:   Reason for exam:    Answer:   Assistance in needle guidance and placement for  procedures requiring needle placement in or near specific anatomical locations not easily accessible without such assistance.  . Informed Consent Details: Physician/Practitioner Attestation; Transcribe to consent form and obtain patient signature    Nursing Order: Transcribe to consent form and obtain patient signature.  Note: Always confirm laterality of pain with Ms. Ermalinda Barrios, before procedure.    Order Specific Question:   Physician/Practitioner attestation of informed consent for procedure/surgical case    Answer:   I, the physician/practitioner, attest that I have discussed with the patient the benefits, risks, side effects, alternatives, likelihood of achieving goals and potential problems during recovery for the procedure that I have provided informed consent.    Order Specific Question:   Procedure    Answer:   Lumbar Facet Block  under fluoroscopic guidance    Order Specific Question:   Physician/Practitioner performing the procedure    Answer:   Lovene Maret A. Dossie Arbour MD    Order Specific Question:   Indication/Reason    Answer:   Low Back Pain, with our without leg pain, due to Facet Joint Arthralgia (Joint Pain) Spondylosis (Arthritis of the Spine), without myelopathy or radiculopathy (Nerve Damage).  . Informed Consent Details: Physician/Practitioner Attestation; Transcribe to consent form and obtain patient signature    Nursing Order: Transcribe to consent form and obtain patient signature. Note: Always confirm laterality of pain with Ms. Ermalinda Barrios, before procedure.    Order Specific Question:   Physician/Practitioner attestation of informed consent for procedure/surgical case    Answer:   I, the physician/practitioner, attest that I have discussed with the patient the benefits, risks, side effects, alternatives, likelihood of achieving goals and potential problems during recovery for the procedure that I have provided informed consent.    Order Specific Question:   Procedure    Answer:    Sacroiliac Joint Block    Order Specific Question:   Physician/Practitioner performing the procedure    Answer:   Devinn Hurwitz A. Dossie Arbour, MD    Order Specific Question:   Indication/Reason    Answer:   Chronic Low Back and Hip Pain secondary to Sacroiliac Joint Pain (Arthralgia/Arthropathy)  . Provide equipment / supplies at bedside    "Block Tray" (Disposable  single use) Needle type: SpinalSpinal Amount/quantity: 5 Size: Medium (5-inch) Gauge: 22G    Standing Status:   Standing    Number of Occurrences:   1    Order Specific Question:   Specify    Answer:   Block Tray   Chronic Opioid Analgesic:  Hydrocodone/APAP 5/325, 1 tab PO q 8 hrs (15 mg/day of hydrocodone) MME/day:15mg /day.   Medications ordered for procedure: Meds ordered this encounter  Medications  . lidocaine (XYLOCAINE) 2 % (with pres) injection 400 mg  . DISCONTD: lactated ringers infusion 1,000 mL  . DISCONTD: midazolam (VERSED) 5 MG/5ML injection 1-2 mg    Make sure Flumazenil is available in the pyxis when using this medication. If oversedation occurs, administer 0.2 mg IV over 15 sec. If after 45 sec no response, administer 0.2 mg again over 1 min; may repeat at 1 min intervals; not to exceed 4 doses (1 mg)  . DISCONTD: fentaNYL (SUBLIMAZE) injection 25-50 mcg    Make sure Narcan is available in the pyxis when using this medication. In the event of respiratory depression (RR< 8/min): Titrate NARCAN (naloxone) in increments of 0.1 to 0.2 mg IV at 2-3 minute intervals, until desired degree of reversal.  . ropivacaine (PF) 2 mg/mL (0.2%) (NAROPIN) injection 9 mL  . triamcinolone acetonide (KENALOG-40) injection 40 mg  . ropivacaine (PF) 2 mg/mL (0.2%) (NAROPIN) injection 4 mL  . methylPREDNISolone acetate (DEPO-MEDROL) injection 80 mg  . HYDROcodone-acetaminophen (NORCO/VICODIN) 5-325 MG tablet    Sig: Take 1 tablet by mouth every 8 (eight) hours as needed  for severe pain. Must last 30 days    Dispense:  90 tablet     Refill:  0    Chronic Pain: STOP Act (Not applicable) Fill 1 day early if closed on refill date. Avoid benzodiazepines within 8 hours of opioids  . HYDROcodone-acetaminophen (NORCO/VICODIN) 5-325 MG tablet    Sig: Take 1 tablet by mouth every 8 (eight) hours as needed for severe pain. Must last 30 days    Dispense:  90 tablet    Refill:  0    Chronic Pain: STOP Act (Not applicable) Fill 1 day early if closed on refill date. Avoid benzodiazepines within 8 hours of opioids  . HYDROcodone-acetaminophen (NORCO/VICODIN) 5-325 MG tablet    Sig: Take 1 tablet by mouth every 8 (eight) hours as needed for severe pain. Must last 30 days    Dispense:  90 tablet    Refill:  0    Chronic Pain: STOP Act (Not applicable) Fill 1 day early if closed on refill date. Avoid benzodiazepines within 8 hours of opioids  . gabapentin (NEURONTIN) 100 MG capsule    Sig: Take 2 capsules (200 mg total) by mouth 2 (two) times daily AND 3-4 capsules (300-400 mg total) at bedtime.    Dispense:  720 capsule    Refill:  0   Medications administered: We administered lidocaine, ropivacaine (PF) 2 mg/mL (0.2%), triamcinolone acetonide, ropivacaine (PF) 2 mg/mL (0.2%), and methylPREDNISolone acetate.  See the medical record for exact dosing, route, and time of administration.  Follow-up plan:   Return in about 2 weeks (around 01/04/2021) for (F2F), (PP) Follow-up.       Interventional Therapies  Risk  Complexity Considerations:   WNL   Planned  Pending:   Pending further evaluation   Under consideration:   Diagnosticbilateral cervical facet block  Possiblebilateral cervical facet RFA    Completed:   Therapeutic right thoracic back TPI x2 (10/17/2018)  Therapeutic left thoracic back TPI x1 (10/17/2018) Therapeutic right quadratus lumborum and erector spinae muscle TPI x1 (02/12/2018) Diagnostic right lumbar facet block x3 (11/06/2019) (100/100/100 x3 days) Diagnostic left lumbar facet block x2 (07/11/2017)  (100/100/R-95, L-0/90) Therapeutic/palliative right lumbar facet block x3 (03/28/2018) (100/100/0/<25) Therapeutic/palliative left lumbar facet block x1 (08/09/2017) (100/90/20/<25) Therapeutic right lumbar facet RFA x2 (06/04/2018) (100/90/75/75-100) Therapeutic left lumbar facet RFA x1 (02/12/2018)  Diagnostic right SI joint block x1 (03/18/2020) (75/75/0) Therapeutic right SI joint RFA x3 (06/05/2019)  Therapeutic midline L2-3 LESI x1 (10/04/2017) (100/100/85/>50) Therapeutic right L3-4 LESI x2 (11/04/2020) Therapeutic right L4-5 LESI x1 (05/20/2020) Therapeutic right L5-S1 LESI x2 (12/04/2019) Therapeutic right L5 TFESI x3 (12/04/2019)  Therapeutic right S1 TFESI x1 (11/04/2020) Diagnostic bilateral superficial trochanteric bursa injection x1 (04/30/2018)  Diagnostic right IA hip injection x1 (02/13/2019)    Palliative options:   None at this time     Recent Visits Date Type Provider Dept  11/18/20 Telemedicine Milinda Pointer, MD [facility]  11/04/20 Procedure visit Milinda Pointer, MD [facility]  11/03/20 Telemedicine Milinda Pointer, MD [facility]  09/27/20 Office Visit Milinda Pointer, MD [facility]  Showing recent visits within past 90 days and meeting all other requirements Today's Visits Date Type Provider Dept  12/21/20 Procedure visit Milinda Pointer, MD [facility]  Showing today's visits and meeting all other requirements Future Appointments Date Type Provider Dept  01/04/21 Appointment Milinda Pointer, MD [facility]  Showing future appointments within next 90 days and meeting all other requirements  Disposition: Discharge home  Discharge (Date  Time): 12/21/2020; 0915 hrs.   Primary Care Physician: Dale [place], MD Location: [facility] Note by: Oswaldo Done, MD Date: 12/21/2020; Time: 11:07 AM  Disclaimer:  Medicine is not an Visual merchandiser. The only  guarantee in medicine is that nothing is guaranteed. It is important to note that the decision to proceed with this intervention was based on the information collected from the patient. The Data and conclusions were drawn from the patient's questionnaire, the interview, and the physical examination. Because the information was provided in large part by the patient, it cannot be guaranteed that it has not been purposely or unconsciously manipulated. Every effort has been made to obtain as much relevant data as possible for this evaluation. It is important to note that the conclusions that lead to this procedure are derived in large part from the available data. Always take into account that the treatment will also be dependent on availability of resources and existing treatment guidelines, considered by other Pain Management Practitioners as being common knowledge and practice, at the time of the intervention. For Medico-Legal purposes, it is also important to point out that variation in procedural techniques and pharmacological choices are the acceptable norm. The indications, contraindications, technique, and results of the above procedure should only be interpreted and judged by a Board-Certified Interventional Pain Specialist with extensive familiarity and expertise in the same exact procedure and technique.

## 2020-12-21 NOTE — Progress Notes (Signed)
Safety precautions to be maintained throughout the outpatient stay will include: orient to surroundings, keep bed in low position, maintain call bell within reach at all tiNursing Pain Medication Assessment:  Safety precautions to be maintained throughout the outpatient stay will include: orient to surroundings, keep bed in low position, maintain call bell within reach at all times, provide assistance with transfer out of bed and ambulation.  Medication Inspection Compliance: Pill count conducted under aseptic conditions, in front of the patient. Neither the pills nor the bottle was removed from the patient's sight at any time. Once count was completed pills were immediately returned to the patient in their original bottle.   Nursing Pain Medication Assessment:  Safety precautions to be maintained throughout the outpatient stay will include: orient to surroundings, keep bed in low position, maintain call bell within reach at all times, provide assistance with transfer out of bed and ambulation.  Medication Inspection Compliance: Ms. Piatt did not comply with our request to bring her pills to be counted. She was reminded that bringing the medication bottles, even when empty, is a requirement.  Medication: None brought in. Pill/Patch Count: None available to be counted. Bottle Appearance: No container available. Did not bring bottle(s) to appointment. Filled Date: N/A Last Medication intake:  Yesterday

## 2020-12-22 ENCOUNTER — Telehealth: Payer: Self-pay | Admitting: *Deleted

## 2020-12-22 NOTE — Telephone Encounter (Signed)
No problems post procedure. 

## 2021-01-04 ENCOUNTER — Ambulatory Visit: Payer: Medicare Other | Admitting: Pain Medicine

## 2021-01-04 NOTE — Progress Notes (Deleted)
PROVIDER NOTE: Information contained herein reflects review and annotations entered in association with encounter. Interpretation of such information and data should be left to medically-trained personnel. Information provided to patient can be located elsewhere in the medical record under "Patient Instructions". Document created using STT-dictation technology, any transcriptional errors that may result from process are unintentional.    Patient: Wanda Martinez  Service Category: E/M  Provider: Gaspar Cola, MD  DOB: 21-May-1935  DOS: 01/04/2021  Specialty: Interventional Pain Management  MRN: [id number]  Setting: Ambulatory outpatient  PCP: Einar Pheasant, MD  Type: Established Patient    Referring Provider: Einar Pheasant, MD  Location: Office  Delivery: Face-to-face     HPI  [name], a 86 y.o. year old female, is here today because of her Chronic pain syndrome [G89.4]. [name] primary complain today is No chief complaint on file. Last encounter: My last encounter with her was on 12/21/2020. Pertinent problems: [name] has Primary cancer of upper inner quadrant of right female breast ([place]); Headache; Parkinson's disease ([place]); Chronic low back pain (1ry area of Pain) (Bilateral) (R>L); Lumbar spondylosis; Chronic hip pain (Bilateral); Chronic neck pain; Cervical spondylosis; Chronic cervical radicular pain (Right); Diffuse myofascial pain syndrome; Neurogenic pain; Chronic upper back pain (Right); Myofascial pain syndrome (Right) (cervicothoracic); Lumbar facet syndrome (Bilateral) (R>L); Malignant neoplasm of right female breast ([place]); Cervical facet hypertrophy; Cervical facet syndrome ([place]); Chronic shoulder pain (Right); Chronic pain syndrome; Chronic sacroiliac joint pain (Left); Chronic sacroiliac joint pain (Right); 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, L4-5, L5-S1) (Right); Lumbar spinal stenosis (with neurogenic claudication) (L3-4); Chronic lower extremity pain  (2ry area of Pain) (Right); Chronic lumbar radicular pain (S1) (Right); Trochanteric bursitis of hip (Bilateral); Spondylosis without myelopathy or radiculopathy, lumbosacral region; Trigger point with back pain (Right); DDD (degenerative disc disease), lumbosacral; Chronic upper extremity pain (Right); Chronic thoracic back pain (Bilateral) (L>R); Trigger point of thoracic region (Bilateral) (L>R); Other specified dorsopathies, sacral and sacrococcygeal region; Sacroiliac joint dysfunction (Right); Osteoarthritis of sacroiliac joint (Right); Somatic dysfunction of sacroiliac joint (Right); Chronic musculoskeletal pain; Facial pain; Chronic hip pain (Right); Osteoarthritis of hip (Right); Left ear pain; 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); Abnormal MRI, lumbar spine (12/30/2019); Osteoarthritis involving multiple joints; Other spondylosis, sacral and sacrococcygeal region; and Lumbar facet hypertrophy (Multilevel) (Bilateral) on their pertinent problem list. Pain Assessment: Severity of   is reported as a  /10. Location:    / . Onset:  . Quality:  . Timing:  . Modifying factor(s):  Marland Kitchen Vitals:  vitals were not taken for this visit.   Reason for encounter: post-procedure assessment.  ***  Post-Procedure Evaluation  Procedure (12/21/2020): Therapeutic/Palliative right lumbar facet MBB #4 + right SI block #2 under fluoroscopic guidance, and IV sedation Pre-procedure pain level: 7/10 Post-procedure: 0/10 (100% relief)  Sedation: Sedation provided.  Effectiveness during initial hour after procedure(Ultra-Short Term Relief):   ***.  Local anesthetic used: Long-acting (4-6 hours) Effectiveness: Defined as any analgesic benefit obtained secondary to the administration of local anesthetics. This carries significant diagnostic value as to the etiological location, or anatomical origin, of the pain. Duration of benefit is expected to coincide with the duration of the local anesthetic used.  Effectiveness during  initial 4-6 hours after procedure(Short-Term Relief):   ***.  Long-term benefit: Defined as any relief past the pharmacologic duration of the local anesthetics.  Effectiveness past the initial 6 hours after procedure(Long-Term Relief):   ***.  Current benefits: Defined as benefit that persist at this time.  Analgesia:   ***  Function:  ***  ROM:  ***   Pharmacotherapy Assessment   Analgesic: Hydrocodone/APAP 5/325, 1 tab PO q 8 hrs (15 mg/day of hydrocodone) MME/day:15mg /day.   Monitoring: [place] PMP: PDMP reviewed during this encounter.       Pharmacotherapy: No side-effects or adverse reactions reported. Compliance: No problems identified. Effectiveness: Clinically acceptable.  No notes on file  UDS:  Summary  Date Value Ref Range Status  07/07/2020 Note  Final    Comment:    ==================================================================== [employer] 13 (MW) ==================================================================== Test                             Result       Flag       Units  Drug Present and Declared for Prescription Verification   Hydrocodone                    389          EXPECTED   ng/mg creat   Dihydrocodeine                 131          EXPECTED   ng/mg creat   Norhydrocodone                 1443         EXPECTED   ng/mg creat    Sources of hydrocodone include scheduled prescription medications.    Dihydrocodeine and norhydrocodone are expected metabolites of    hydrocodone. Dihydrocodeine is also available as a scheduled    prescription medication.  ==================================================================== Test                      Result    Flag   Units      Ref Range   Creatinine              102              mg/dL      >=20 ==================================================================== Declared Medications:  The flagging and interpretation on this report are based on the  following declared medications.  Unexpected  results may arise from  inaccuracies in the declared medications.   **Note: The testing scope of this panel includes these medications:   Hydrocodone (Norco)   **Note: The testing scope of this panel does not include the  following reported medications:   Acetaminophen (Norco)  Amlodipine (Norvasc)  Aspirin  Calcium  Citalopram (Celexa)  Cyclobenzaprine (Flexeril)  Donepezil (Aricept)  Gabapentin (Neurontin)  Hydrochlorothiazide (Hydrodiuril)  Letrozole (Femara)  Losartan (Cozaar)  Lubiprostone (Amitiza)  Magnesium (Mag-Ox)  Meloxicam (Mobic)  Metoprolol (Toprol)  Multivitamin  Nitrofurantoin (Macrobid)  Nystatin (Mycostatin)  Omega-3 Fatty Acids  Omeprazole (Prilosec)  Pantoprazole (Protonix)  Pravastatin (Pravachol) ==================================================================== For clinical consultation, please call [phone]. ====================================================================      ROS  Constitutional: Denies any fever or chills Gastrointestinal: No reported hemesis, hematochezia, vomiting, or acute GI distress Musculoskeletal: Denies any acute onset joint swelling, redness, loss of ROM, or weakness Neurological: No reported episodes of acute onset apraxia, aphasia, dysarthria, agnosia, amnesia, paralysis, loss of coordination, or loss of consciousness  Medication Review  Glucosamine Sulfate, HYDROcodone-acetaminophen, Omega-3, amLODipine, aspirin, calcium carbonate, citalopram, cyclobenzaprine, donepezil, gabapentin, hydrochlorothiazide, letrozole, losartan, lubiprostone, magnesium oxide, meloxicam, metoprolol succinate, multivitamin, nystatin, omeprazole, pantoprazole, and pravastatin  History Review  Allergy: [name] is allergic to sulfa  antibiotics and vesicare [solifenacin]. Drug: [name]  reports no history of drug use. Alcohol:  reports no history of alcohol use. Tobacco:  reports that she has never smoked. She has never  used smokeless tobacco. Social: [name]  reports that she has never smoked. She has never used smokeless tobacco. She reports that she does not drink alcohol and does not use drugs. Medical:  has a past medical history of Acute postoperative pain (10/25/2017), Anemia, Arm pain (07/26/2015), Arthritis, Arthritis, degenerative (03/26/2014), Back pain (11/01/2013), Bladder infection (06/2018), Breast cancer ([place]), Breast cancer ([place]), CHEST PAIN (04/29/2010), Chronic cystitis, Cystocele (02/22/2013), Cystocele, midline (08/19/2013), Degeneration of intervertebral disc of lumbosacral region (03/26/2014), DYSPNEA (04/29/2010), Enthesopathy of hip (03/26/2014), GERD (gastroesophageal reflux disease), Hiatal hernia, HTN (hypertension), Hypokalemia (06/2018), Hyponatremia (06/2018), LBP (low back pain) (03/26/2014), Neck pain (11/01/2013), Parkinson disease ([place]), Pneumonia (06/2018), Sinusitis (02/07/2015), Skin lesions (07/12/2014), and Urinary incontinence. Surgical: [name]  has a past surgical history that includes Tonsillectomy and adenoidectomy (81); Vesicovaginal fistula closure w/ TAH (1983); Breast surgery (1986); Breast enhancement surgery (1987); Breast implant removal; Breast implant removal (Right, 08/29/2012); Mastectomy (08/2012); Abdominal hysterectomy; Colonoscopy with propofol (N/A, 09/13/2016); and Breast biopsy (2013). Family: family history includes Colon polyps in her father; Diabetes in her father; Parkinson's disease in her mother; Stroke in her father and mother.  Laboratory Chemistry Profile   Renal Lab Results  Component Value Date   BUN 18 07/27/2020   CREATININE 0.99 07/27/2020   BCR 20 06/11/2020   GFR 44.26 (L) 01/05/2020   GFRAA >60 07/27/2020   GFRNONAA 53 (L) 07/27/2020     Hepatic Lab Results  Component Value Date   AST 20 06/11/2020   ALT 19 06/11/2020   ALBUMIN 4.4 10/17/2019   ALKPHOS 50 10/17/2019     Electrolytes Lab Results  Component Value Date   NA 137 07/27/2020    K 3.8 07/27/2020   CL 99 07/27/2020   CALCIUM 9.4 07/27/2020   MG 2.2 07/17/2018     Bone Lab Results  Component Value Date   25OHVITD1 42 07/17/2018   25OHVITD2 <1.0 07/17/2018   25OHVITD3 42 07/17/2018     Inflammation (CRP: Acute Phase) (ESR: Chronic Phase) Lab Results  Component Value Date   CRP 3 07/17/2018   ESRSEDRATE 27 07/17/2018       Note: Above Lab results reviewed.  Recent Imaging Review  DG PAIN CLINIC C-ARM 1-60 MIN NO REPORT Fluoro was used, but no Radiologist interpretation will be provided.  Please refer to "NOTES" tab for provider progress note. Note: Reviewed        Physical Exam  General appearance: Well nourished, well developed, and well hydrated. In no apparent acute distress Mental status: Alert, oriented x 3 (person, place, & time)       Respiratory: No evidence of acute respiratory distress Eyes: PERLA Vitals: LMP 12/18/1981  BMI: Estimated body mass index is 31.76 kg/m as calculated from the following:   Height as of 12/21/20: $RemoveBe'5\' 4"'SSjnSWegJ$  (1.626 m).   Weight as of 12/21/20: 185 lb (83.9 kg). Ideal: Ideal body weight: 54.7 kg (120 lb 9.5 oz) Adjusted ideal body weight: 66.4 kg (146 lb 5.7 oz)  Assessment   Status Diagnosis  Controlled Controlled Controlled 1. Chronic pain syndrome   2. Chronic low back pain (1ry area of Pain) (Bilateral) (R>L)   3. Lumbar facet syndrome (Bilateral) (R>L)   4. DDD (degenerative disc disease), lumbosacral   5. Chronic sacroiliac joint pain (Right)  Updated Problems: No problems updated.  Plan of Care  Problem-specific:  No problem-specific Assessment & Plan notes found for this encounter.  [name] has a current medication list which includes the following long-term medication(s): amlodipine, calcium carbonate, cyclobenzaprine, donepezil, gabapentin, hydrochlorothiazide, hydrocodone-acetaminophen, [START ON 01/22/2021] hydrocodone-acetaminophen, [START ON 02/21/2021] hydrocodone-acetaminophen,  [START ON 03/23/2021] hydrocodone-acetaminophen, losartan, magnesium oxide, meloxicam, metoprolol succinate, omeprazole, pantoprazole, and pravastatin.  Pharmacotherapy (Medications Ordered): No orders of the defined types were placed in this encounter.  Orders:  No orders of the defined types were placed in this encounter.  Follow-up plan:   No follow-ups on file.      Interventional Therapies  Risk  Complexity Considerations:   WNL   Planned  Pending:   Pending further evaluation   Under consideration:   Diagnosticbilateral cervical facet block  Possiblebilateral cervical facet RFA    Completed:   Therapeutic right thoracic back TPI x2 (10/17/2018)  Therapeutic left thoracic back TPI x1 (10/17/2018) Therapeutic right quadratus lumborum and erector spinae muscle TPI x1 (02/12/2018) Diagnostic right lumbar facet block x4 (12/21/20) (100/100/100 x3 days) Diagnostic left lumbar facet block x2 (07/11/2017) (100/100/R-95, L-0/90) Therapeutic/palliative right lumbar facet block x3 (03/28/2018) (100/100/0/<25) Therapeutic/palliative left lumbar facet block x1 (08/09/2017) (100/90/20/<25) Therapeutic right lumbar facet RFA x2 (06/04/2018) (100/90/75/75-100) Therapeutic left lumbar facet RFA x1 (02/12/2018)  Diagnostic right SI joint block x2 (12/21/20) (75/75/0) Therapeutic right SI joint RFA x3 (06/05/2019)  Therapeutic midline L2-3 LESI x1 (10/04/2017) (100/100/85/>50) Therapeutic right L3-4 LESI x2 (11/04/2020) Therapeutic right L4-5 LESI x1 (05/20/2020) Therapeutic right L5-S1 LESI x2 (12/04/2019) Therapeutic right L5 TFESI x3 (12/04/2019)  Therapeutic right S1 TFESI x1 (11/04/2020) Diagnostic bilateral superficial trochanteric bursa injection x1 (04/30/2018)  Diagnostic right IA hip injection x1 (02/13/2019)    Palliative options:   None at this time      Recent Visits Date Type Provider Dept  12/21/20 Procedure visit Milinda Pointer, MD [facility]  11/18/20  Telemedicine Milinda Pointer, MD [facility]  11/04/20 Procedure visit Milinda Pointer, MD [facility]  11/03/20 Telemedicine Milinda Pointer, MD [facility]  Showing recent visits within past 90 days and meeting all other requirements Today's Visits Date Type Provider Dept  01/04/21 Appointment Milinda Pointer, MD [facility]  Showing today's visits and meeting all other requirements Future Appointments No visits were found meeting these conditions. Showing future appointments within next 90 days and meeting all other requirements  I discussed the assessment and treatment plan with the patient. The patient was provided an opportunity to ask questions and all were answered. The patient agreed with the plan and demonstrated an understanding of the instructions.  Patient advised to call back or seek an in-person evaluation if the symptoms or condition worsens.  Duration of encounter: *** minutes.  Note by: Gaspar Cola, MD Date: 01/04/2021; Time: 5:54 AM

## 2021-01-12 ENCOUNTER — Encounter: Payer: Medicare Other | Admitting: Pain Medicine

## 2021-01-24 NOTE — Progress Notes (Signed)
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  INTERVAL HISTORY: Patient returns to clinic today for routine 9-month evaluation and continuation of Prolia.  She continues to feel well and remains asymptomatic. She has no new neurologic complaints.  She denies any recent fevers or illnesses.  She has a good appetite and denies weight loss.  She denies any chest pain, shortness of breath, cough, or hemoptysis.  She denies any nausea, vomiting, constipation, or diarrhea.  She has no urinary complaints.  Patient offers no specific complaints today.  REVIEW OF SYSTEMS:   Review of Systems  Constitutional: Negative.  Negative for fever, malaise/fatigue and weight loss.  Respiratory: Negative.  Negative for cough and shortness of breath.   Cardiovascular: Negative.  Negative for chest pain and leg swelling.  Gastrointestinal: Negative.  Negative for abdominal pain.  Genitourinary: Negative.   Musculoskeletal: Negative.  Negative for back pain and joint pain.  Skin: Negative.  Negative for rash.  Neurological: Positive for tremors. Negative for sensory change, speech change, focal weakness and weakness.  Psychiatric/Behavioral: Negative.  Negative for depression. The patient is not nervous/anxious.     As per HPI. Otherwise, a complete review of systems is negative.  PAST MEDICAL HISTORY: Past Medical History:  Diagnosis Date  . Acute postoperative pain 10/25/2017  . Anemia   . Arm pain 07/26/2015  . Arthritis   . Arthritis, degenerative 03/26/2014  . Back pain 11/01/2013  . Bladder infection 06/2018  . Breast cancer ([place])    Masectomy - left - 1986   . Breast cancer [facility])     Mastectomy-right -2014  . CHEST PAIN 04/29/2010   Qualifier: Diagnosis of  By: Wynetta Emery RN, Doroteo Bradford    . Chronic cystitis   . Cystocele 02/22/2013  . Cystocele, midline 08/19/2013  . Degeneration of intervertebral disc of lumbosacral region 03/26/2014  . DYSPNEA 04/29/2010   Qualifier: Diagnosis of  By: Wynetta Emery RN, Doroteo Bradford    . Enthesopathy of hip 03/26/2014  . GERD (gastroesophageal reflux disease)   . Hiatal hernia   . HTN (hypertension)   . Hypokalemia 06/2018  . Hyponatremia 06/2018  . LBP (low back pain) 03/26/2014  . Neck pain 11/01/2013  . Parkinson disease ([place])   . Pneumonia 06/2018  . Sinusitis 02/07/2015  . Skin lesions 07/12/2014  . Urinary incontinence    mixed     PAST SURGICAL HISTORY: Past Surgical History:  Procedure Laterality Date  . ABDOMINAL HYSTERECTOMY    . BREAST BIOPSY  2013  . BREAST ENHANCEMENT SURGERY  1987  . BREAST IMPLANT REMOVAL    . BREAST IMPLANT REMOVAL Right 08/29/2012  . BREAST SURGERY  1986   s/p left mastectomy  . COLONOSCOPY WITH PROPOFOL N/A 09/13/2016   Procedure: COLONOSCOPY WITH PROPOFOL;  Surgeon: Manya Silvas, MD;  Location: [facility] ENDOSCOPY;  Service: Endoscopy;  Laterality: N/A;  . MASTECTOMY  08/2012   right  . TONSILLECTOMY AND ADENOIDECTOMY  79  . VESICOVAGINAL FISTULA CLOSURE W/ TAH  1983    FAMILY HISTORY Family History  Problem Relation Age of Onset  . Diabetes Father   . Stroke Father   . Colon polyps Father   . Stroke Mother   . Parkinson's disease Mother  ADVANCED DIRECTIVES:    HEALTH MAINTENANCE: Social History   Tobacco Use  . Smoking status: Never Smoker  . Smokeless tobacco: Never Used  . Tobacco comment: tobacco use - no  Vaping Use  . Vaping Use: Never used  Substance Use Topics  . Alcohol use: No    Alcohol/week: 0.0 standard drinks  . Drug use: No     Allergies  Allergen Reactions  . Sulfa Antibiotics Nausea Only  . Vesicare [Solifenacin] Other (See Comments)    Constipation Constipation     Current Outpatient Medications  Medication Sig Dispense Refill  . amLODipine (NORVASC) 2.5 MG tablet TAKE 1 TABLET BY MOUTH ONCE DAILY. 30 tablet 11  . aspirin 81 MG EC tablet Take 81 mg by mouth daily as needed.    . calcium carbonate (OSCAL) 1500 (600 Ca) MG TABS tablet Take 1 tablet by mouth daily.    . citalopram (CELEXA) 20 MG tablet TAKE 1 & 1/2 TABLETS BY MOUTH ONCE DAILY    . cyclobenzaprine (FLEXERIL) 10 MG tablet TAKE ONE TABLET BY MOUTH AT BEDTIME. 30 tablet 11  . donepezil (ARICEPT) 5 MG tablet     . gabapentin (NEURONTIN) 100 MG capsule Take 2 capsules (200 mg total) by mouth 2 (two) times daily AND 3-4 capsules (300-400 mg total) at bedtime. 720 capsule 0  . GLUCOSAMINE SULFATE PO Take by mouth daily.     . hydrochlorothiazide (HYDRODIURIL) 25 MG tablet TAKE 1 TABLET BY MOUTH ONCE DAILY. (PATIENT TAKES 1/2 ONCE DAILY AND EXTRA 1/2 IF NEEDED) 15 tablet 11  . HYDROcodone-acetaminophen (NORCO/VICODIN) 5-325 MG tablet Take 1 tablet by mouth every 8 (eight) hours as needed for severe pain. Must last 30 days 90 tablet 0  . [START ON 02/21/2021] HYDROcodone-acetaminophen (NORCO/VICODIN) 5-325 MG tablet Take 1 tablet by mouth every 8 (eight) hours as needed for severe pain. Must last 30 days 90 tablet 0  . [START ON 03/23/2021] HYDROcodone-acetaminophen (NORCO/VICODIN) 5-325 MG tablet Take 1 tablet by mouth every 8 (eight) hours as needed for severe pain. Must last 30 days 90 tablet 0  . letrozole (FEMARA) 2.5 MG tablet Take 2.5 mg by mouth daily.    Marland Kitchen losartan (COZAAR) 50 MG tablet TAKE (2) TABLETS BY MOUTH ONCE DAILY. 60 tablet 11  . lubiprostone (AMITIZA) 24 MCG capsule TAKE 1 CAPSULE BY MOUTH ONCE DAILY WITH BREAKFAST.    . magnesium oxide (MAG-OX) 400 (241.3 Mg) MG tablet TAKE 1 TABLET BY MOUTH ONCE DAILY. 30 tablet 11  . melatonin 1 MG TABS tablet Take 3 mg by mouth at bedtime.    . meloxicam (MOBIC) 7.5 MG tablet TAKE 1 TABLET BY MOUTH ONCE DAILY. 30 tablet 4  . metoprolol  succinate (TOPROL-XL) 25 MG 24 hr tablet TAKE 1 TABLET BY MOUTH TWICE DAILY 60 tablet 11  . Multiple Vitamin (MULTIVITAMIN) tablet Take 1 tablet by mouth daily.    Marland Kitchen nystatin (MYCOSTATIN) 100000 UNIT/ML suspension Swish and spit tid 60 mL 0  . Omega-3 1000 MG CAPS Take by mouth daily.     Marland Kitchen omeprazole (PRILOSEC) 20 MG capsule TAKE 1 CAPSULE BY MOUTH TWICE DAILY FOR REFLUX. 60 capsule 1  . pantoprazole (PROTONIX) 40 MG tablet TAKE 1 TABLET BY MOUTH ONCE DAILY. 30 tablet 11  . pravastatin (PRAVACHOL) 20 MG tablet TAKE 1 TABLET BY MOUTH ONCE DAILY. 30 tablet 11  . HYDROcodone-acetaminophen (NORCO/VICODIN) 5-325 MG tablet Take 1 tablet by mouth every 8 (eight) hours as needed for severe pain. Must last 30  days 90 tablet 0   No current facility-administered medications for this visit.    OBJECTIVE: Vitals:   01/27/21 1114  BP: 130/74  Pulse: (!) 54  Resp: 18  Temp: 98.4 F (36.9 C)  SpO2: 100%     Body mass index is 32.22 kg/m.    ECOG FS:0 - Asymptomatic  General: Well-developed, well-nourished, no acute distress. Eyes: Pink conjunctiva, anicteric sclera. HEENT: Normocephalic, moist mucous membranes. Breast: Bilateral mastectomy. Lungs: No audible wheezing or coughing. Heart: Regular rate and rhythm. Abdomen: Soft, nontender, no obvious distention. Musculoskeletal: No edema, cyanosis, or clubbing. Neuro: Alert, answering all questions appropriately. Cranial nerves grossly intact. Skin: No rashes or petechiae noted. Psych: Normal affect.  LAB RESULTS:  Lab Results  Component Value Date   NA 134 (L) 01/27/2021   K 3.7 01/27/2021   CL 95 (L) 01/27/2021   CO2 28 01/27/2021   GLUCOSE 88 01/27/2021   BUN 25 (H) 01/27/2021   CREATININE 1.15 (H) 01/27/2021   CALCIUM 9.2 01/27/2021   PROT 6.8 06/11/2020   ALBUMIN 4.4 10/17/2019   AST 20 06/11/2020   ALT 19 06/11/2020   ALKPHOS 50 10/17/2019   BILITOT 0.4 06/11/2020   GFRNONAA 47 (L) 01/27/2021   GFRAA >60 07/27/2020     Lab Results  Component Value Date   WBC 6.6 06/11/2020   NEUTROABS 4,336 06/11/2020   HGB 11.3 (L) 06/11/2020   HCT 34.5 (L) 06/11/2020   MCV 90.1 06/11/2020   PLT 213 06/11/2020     STUDIES: No results found.  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  PLAN:    1.  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  Patient completed 5 years of letrozole in May 2019.  No further interventions are needed.  Patient has had bilateral mastectomies, therefore no further mammograms are necessary.  Return to clinic as scheduled for Prolia. 2.  Osteoporosis: Patient's most recent bone mineral density on July 01, 2020 revealed a T score of -2.9 which is essentially unchanged from 1 year prior where the T score was reported -3.0. Proceed with Prolia today.  Continue calcium and vitamin D supplementation.  Return to clinic in 6 months for repeat laboratory work, further evaluation and continuation of treatment.  Repeat bone mineral density in July 2022.   3.  Tremors/Parkinson's: Chronic and unchanged.  Treatment per neurology.   Patient expressed understanding and was in agreement with this plan. She also understands that She can call clinic at any time with any questions, concerns, or complaints.   Breast cancer [facility])   Staging form: Breast, AJCC 7th Edition     Clinical stage from 09/22/2015: Stage IIA (T1c, N1, M0) - Signed by Lloyd Huger, MD on 09/22/2015   Lloyd Huger, MD   01/28/2021 5:39 AM

## 2021-01-27 ENCOUNTER — Encounter: Payer: Self-pay | Admitting: Oncology

## 2021-01-27 ENCOUNTER — Inpatient Hospital Stay: Payer: Medicare Other | Attending: Oncology

## 2021-01-27 ENCOUNTER — Other Ambulatory Visit: Payer: Self-pay

## 2021-01-27 ENCOUNTER — Inpatient Hospital Stay: Payer: Medicare Other | Admitting: Oncology

## 2021-01-27 ENCOUNTER — Inpatient Hospital Stay: Payer: Medicare Other

## 2021-01-27 VITALS — BP 130/74 | HR 54 | Temp 98.4°F | Resp 18 | Wt 187.7 lb

## 2021-01-27 DIAGNOSIS — Z9013 Acquired absence of bilateral breasts and nipples: Secondary | ICD-10-CM | POA: Insufficient documentation

## 2021-01-27 DIAGNOSIS — Z90710 Acquired absence of both cervix and uterus: Secondary | ICD-10-CM | POA: Diagnosis not present

## 2021-01-27 DIAGNOSIS — Z79899 Other long term (current) drug therapy: Secondary | ICD-10-CM | POA: Insufficient documentation

## 2021-01-27 DIAGNOSIS — M810 Age-related osteoporosis without current pathological fracture: Secondary | ICD-10-CM | POA: Diagnosis not present

## 2021-01-27 DIAGNOSIS — G20 Parkinson's disease: Secondary | ICD-10-CM | POA: Diagnosis not present

## 2021-01-27 DIAGNOSIS — I10 Essential (primary) hypertension: Secondary | ICD-10-CM | POA: Insufficient documentation

## 2021-01-27 DIAGNOSIS — K219 Gastro-esophageal reflux disease without esophagitis: Secondary | ICD-10-CM | POA: Diagnosis not present

## 2021-01-27 DIAGNOSIS — Z833 Family history of diabetes mellitus: Secondary | ICD-10-CM | POA: Diagnosis not present

## 2021-01-27 DIAGNOSIS — Z79811 Long term (current) use of aromatase inhibitors: Secondary | ICD-10-CM | POA: Diagnosis not present

## 2021-01-27 DIAGNOSIS — Z170 Estrogen receptor positive status [ER+]: Secondary | ICD-10-CM | POA: Insufficient documentation

## 2021-01-27 DIAGNOSIS — Z7982 Long term (current) use of aspirin: Secondary | ICD-10-CM | POA: Insufficient documentation

## 2021-01-27 DIAGNOSIS — C50211 Malignant neoplasm of upper-inner quadrant of right female breast: Secondary | ICD-10-CM | POA: Insufficient documentation

## 2021-01-27 LAB — BASIC METABOLIC PANEL
Anion gap: 11 (ref 5–15)
BUN: 25 mg/dL — ABNORMAL HIGH (ref 8–23)
CO2: 28 mmol/L (ref 22–32)
Calcium: 9.2 mg/dL (ref 8.9–10.3)
Chloride: 95 mmol/L — ABNORMAL LOW (ref 98–111)
Creatinine, Ser: 1.15 mg/dL — ABNORMAL HIGH (ref 0.44–1.00)
GFR, Estimated: 47 mL/min — ABNORMAL LOW (ref >60)
Glucose, Bld: 88 mg/dL (ref 70–99)
Potassium: 3.7 mmol/L (ref 3.5–5.1)
Sodium: 134 mmol/L — ABNORMAL LOW (ref 135–145)

## 2021-01-27 MED ORDER — DENOSUMAB 60 MG/ML ~~LOC~~ SOSY
60.0000 mg | PREFILLED_SYRINGE | Freq: Once | SUBCUTANEOUS | Status: AC
  Administered 2021-01-27: 60 mg via SUBCUTANEOUS
  Filled 2021-01-27: qty 1

## 2021-01-27 NOTE — Progress Notes (Unsigned)
Patient here for oncology follow-up appointment, expresses no complaints or concerns at this time.    

## 2021-02-01 ENCOUNTER — Telehealth (INDEPENDENT_AMBULATORY_CARE_PROVIDER_SITE_OTHER): Payer: Medicare Other | Admitting: Internal Medicine

## 2021-02-01 DIAGNOSIS — D649 Anemia, unspecified: Secondary | ICD-10-CM | POA: Diagnosis not present

## 2021-02-01 DIAGNOSIS — G20A1 Parkinson's disease without dyskinesia, without mention of fluctuations: Secondary | ICD-10-CM

## 2021-02-01 DIAGNOSIS — N1831 Chronic kidney disease, stage 3a: Secondary | ICD-10-CM | POA: Diagnosis not present

## 2021-02-01 DIAGNOSIS — E7800 Pure hypercholesterolemia, unspecified: Secondary | ICD-10-CM | POA: Diagnosis not present

## 2021-02-01 DIAGNOSIS — M810 Age-related osteoporosis without current pathological fracture: Secondary | ICD-10-CM

## 2021-02-01 DIAGNOSIS — G20 Parkinson's disease: Secondary | ICD-10-CM

## 2021-02-01 DIAGNOSIS — C50211 Malignant neoplasm of upper-inner quadrant of right female breast: Secondary | ICD-10-CM

## 2021-02-01 DIAGNOSIS — R739 Hyperglycemia, unspecified: Secondary | ICD-10-CM | POA: Diagnosis not present

## 2021-02-01 DIAGNOSIS — K219 Gastro-esophageal reflux disease without esophagitis: Secondary | ICD-10-CM | POA: Diagnosis not present

## 2021-02-01 DIAGNOSIS — R0602 Shortness of breath: Secondary | ICD-10-CM

## 2021-02-01 DIAGNOSIS — F32A Depression, unspecified: Secondary | ICD-10-CM

## 2021-02-01 DIAGNOSIS — F320 Major depressive disorder, single episode, mild: Secondary | ICD-10-CM

## 2021-02-01 DIAGNOSIS — I10 Essential (primary) hypertension: Secondary | ICD-10-CM | POA: Diagnosis not present

## 2021-02-01 NOTE — Progress Notes (Signed)
Patient ID: Wanda Martinez, female   DOB: January 02, 1935, 86 y.o.   MRN: [id number]   Virtual Visit via telephone Note  This visit type was conducted due to national recommendations for restrictions regarding the COVID-19 pandemic (e.g. social distancing).  This format is felt to be most appropriate for this patient at this time.  All issues noted in this document were discussed and addressed.  No physical exam was performed (except for noted visual exam findings with Video Visits).   I connected with  Wanda Martinez by telephone and verified that I am speaking with the correct person using two identifiers. Location patient: home Location provider: work  Persons participating in the telephone visit: patient, provider  The limitations, risks, security and privacy concerns of performing an evaluation and management service by telephone and the availability of in person appointments have been discussed.  It has also been discussed with the patient that there may be a patient responsible charge related to this service. The patient expressed understanding and agreed to proceed.   Reason for visit: scheduled follow up  HPI: Follow up regarding her blood pressure and cholesterol.  Also increased stress.  Increased stress recently with her son-n-law's health issues and recent injury.  Has good support.  Overall feels she is handling things relatively well.  Does not feel needs any further intervention.  No chest pain or sob reported.  No abdominal pain or bowel change reported.  Eating prunes and this is keeping bowels moving.  Seeing pain clinic. Receiving prolia injections.  Just received 01/27/21.      ROS: See pertinent positives and negatives per HPI.  Past Medical History:  Diagnosis Date  . Acute postoperative pain 10/25/2017  . Anemia   . Arm pain 07/26/2015  . Arthritis   . Arthritis, degenerative 03/26/2014  . Back pain 11/01/2013  . Bladder infection 06/2018  . Breast cancer ([place])     Masectomy - left - 1986   . Breast cancer [facility])    Mastectomy-right -2014  . CHEST PAIN 04/29/2010   Qualifier: Diagnosis of  By: Wynetta Emery RN, Doroteo Bradford    . Chronic cystitis   . Cystocele 02/22/2013  . Cystocele, midline 08/19/2013  . Degeneration of intervertebral disc of lumbosacral region 03/26/2014  . DYSPNEA 04/29/2010   Qualifier: Diagnosis of  By: Wynetta Emery RN, Doroteo Bradford    . Enthesopathy of hip 03/26/2014  . GERD (gastroesophageal reflux disease)   . Hiatal hernia   . HTN (hypertension)   . Hypokalemia 06/2018  . Hyponatremia 06/2018  . LBP (low back pain) 03/26/2014  . Neck pain 11/01/2013  . Parkinson disease ([place])   . Pneumonia 06/2018  . Sinusitis 02/07/2015  . Skin lesions 07/12/2014  . Urinary incontinence    mixed     Past Surgical History:  Procedure Laterality Date  . ABDOMINAL HYSTERECTOMY    . BREAST BIOPSY  2013  . BREAST ENHANCEMENT SURGERY  1987  . BREAST IMPLANT REMOVAL    . BREAST IMPLANT REMOVAL Right 08/29/2012  . BREAST SURGERY  1986   s/p left mastectomy  . COLONOSCOPY WITH PROPOFOL N/A 09/13/2016   Procedure: COLONOSCOPY WITH PROPOFOL;  Surgeon: Manya Silvas, MD;  Location: [facility] ENDOSCOPY;  Service: Endoscopy;  Laterality: N/A;  . MASTECTOMY  08/2012   right  . TONSILLECTOMY AND ADENOIDECTOMY  79  . VESICOVAGINAL FISTULA CLOSURE W/ TAH  1983    Family History  Problem Relation Age of Onset  . Diabetes Father   . Stroke  Father   . Colon polyps Father   . Stroke Mother   . Parkinson's disease Mother     SOCIAL HX: reviewed.    Current Outpatient Medications:  .  amLODipine (NORVASC) 2.5 MG tablet, TAKE 1 TABLET BY MOUTH ONCE DAILY., Disp: 30 tablet, Rfl: 11 .  aspirin 81 MG EC tablet, Take 81 mg by mouth daily as needed., Disp: , Rfl:  .  calcium carbonate (OSCAL) 1500 (600 Ca) MG TABS tablet, Take 1 tablet by mouth daily., Disp: , Rfl:  .  citalopram (CELEXA) 20 MG tablet, TAKE 1 & 1/2 TABLETS BY MOUTH ONCE DAILY, Disp: , Rfl:  .  cyclobenzaprine  (FLEXERIL) 10 MG tablet, TAKE ONE TABLET BY MOUTH AT BEDTIME., Disp: 30 tablet, Rfl: 11 .  donepezil (ARICEPT) 5 MG tablet, , Disp: , Rfl:  .  gabapentin (NEURONTIN) 100 MG capsule, Take 2 capsules (200 mg total) by mouth 2 (two) times daily AND 3-4 capsules (300-400 mg total) at bedtime., Disp: 720 capsule, Rfl: 0 .  GLUCOSAMINE SULFATE PO, Take by mouth daily. , Disp: , Rfl:  .  hydrochlorothiazide (HYDRODIURIL) 25 MG tablet, TAKE 1 TABLET BY MOUTH ONCE DAILY. (PATIENT TAKES 1/2 ONCE DAILY AND EXTRA 1/2 IF NEEDED), Disp: 15 tablet, Rfl: 11 .  HYDROcodone-acetaminophen (NORCO/VICODIN) 5-325 MG tablet, Take 1 tablet by mouth every 8 (eight) hours as needed for severe pain. Must last 30 days, Disp: 90 tablet, Rfl: 0 .  HYDROcodone-acetaminophen (NORCO/VICODIN) 5-325 MG tablet, Take 1 tablet by mouth every 8 (eight) hours as needed for severe pain. Must last 30 days, Disp: 90 tablet, Rfl: 0 .  [START ON 02/21/2021] HYDROcodone-acetaminophen (NORCO/VICODIN) 5-325 MG tablet, Take 1 tablet by mouth every 8 (eight) hours as needed for severe pain. Must last 30 days, Disp: 90 tablet, Rfl: 0 .  [START ON 03/23/2021] HYDROcodone-acetaminophen (NORCO/VICODIN) 5-325 MG tablet, Take 1 tablet by mouth every 8 (eight) hours as needed for severe pain. Must last 30 days, Disp: 90 tablet, Rfl: 0 .  letrozole (FEMARA) 2.5 MG tablet, Take 2.5 mg by mouth daily., Disp: , Rfl:  .  losartan (COZAAR) 50 MG tablet, TAKE (2) TABLETS BY MOUTH ONCE DAILY., Disp: 60 tablet, Rfl: 11 .  lubiprostone (AMITIZA) 24 MCG capsule, TAKE 1 CAPSULE BY MOUTH ONCE DAILY WITH BREAKFAST., Disp: , Rfl:  .  magnesium oxide (MAG-OX) 400 (241.3 Mg) MG tablet, TAKE 1 TABLET BY MOUTH ONCE DAILY., Disp: 30 tablet, Rfl: 11 .  melatonin 1 MG TABS tablet, Take 3 mg by mouth at bedtime., Disp: , Rfl:  .  meloxicam (MOBIC) 7.5 MG tablet, TAKE 1 TABLET BY MOUTH ONCE DAILY., Disp: 30 tablet, Rfl: 4 .  metoprolol succinate (TOPROL-XL) 25 MG 24 hr tablet, TAKE 1  TABLET BY MOUTH TWICE DAILY, Disp: 60 tablet, Rfl: 11 .  Multiple Vitamin (MULTIVITAMIN) tablet, Take 1 tablet by mouth daily., Disp: , Rfl:  .  nystatin (MYCOSTATIN) 100000 UNIT/ML suspension, Swish and spit tid, Disp: 60 mL, Rfl: 0 .  Omega-3 1000 MG CAPS, Take by mouth daily. , Disp: , Rfl:  .  omeprazole (PRILOSEC) 20 MG capsule, TAKE 1 CAPSULE BY MOUTH TWICE DAILY FOR REFLUX., Disp: 60 capsule, Rfl: 1 .  pantoprazole (PROTONIX) 40 MG tablet, TAKE 1 TABLET BY MOUTH ONCE DAILY., Disp: 30 tablet, Rfl: 11 .  pravastatin (PRAVACHOL) 20 MG tablet, TAKE 1 TABLET BY MOUTH ONCE DAILY., Disp: 30 tablet, Rfl: 11  EXAM:  VITALS per patient if applicable: 542/70  GENERAL: alert,  oriented, appears well and in no acute distress  HEENT: atraumatic, conjunttiva clear, no obvious abnormalities on inspection of external nose and ears  NECK: normal movements of the head and neck  LUNGS: on inspection no signs of respiratory distress, breathing rate appears normal, no obvious gross SOB, gasping or wheezing  CV: no obvious cyanosis  PSYCH/NEURO: pleasant and cooperative, no obvious depression or anxiety, speech and thought processing grossly intact  ASSESSMENT AND PLAN:  Discussed the following assessment and plan:  Problem List Items Addressed This Visit    Anemia    Follow cbc.  Being followed by hematology.       Relevant Orders   CBC with Differential/Platelet   Ferritin   CKD (chronic kidney disease) stage 3, GFR 30-59 ml/min (HCC)    Avoid antiinflammatories.  Follow metabolic panel.       Relevant Orders   Basic metabolic panel   Essential hypertension    Continue hctz, metoprolol, amlodipine and losartan.  Pressure as outlined.  Follow pressures.  Follow metabolic panel.       Relevant Orders   TSH   GERD (gastroesophageal reflux disease)    Stable. On protonix.       Hypercholesterolemia    On pravastatin.  Follow lipid panel and liver function tests.        Relevant  Orders   Hepatic function panel   Lipid panel   Hyperglycemia    Low carb diet and exercise.  Follow met b and a1c.       Relevant Orders   Hemoglobin A1c   Mild depression (HCC)    Continue citalopram.  Stable.  Increased stress recently as outlined.  Has good support.  Does not feel needs any further intervention.  Follow.       Osteoporosis    Continue prolia injections.       Parkinsons ([place])    Followed by neurology.  Stable.       Primary cancer of upper inner quadrant of right female breast ([place])    Followed by oncology.  On femara.        Pure hypercholesterolemia    On pravastatin.  Low cholesterol diet and exercise.  Follow lipid panel and liver function tests.        SOB (shortness of breath)    No increased sob reported.  Follow.           I discussed the assessment and treatment plan with the patient. The patient was provided an opportunity to ask questions and all were answered. The patient agreed with the plan and demonstrated an understanding of the instructions.   The patient was advised to call back or seek an in-person evaluation if the symptoms worsen or if the condition fails to improve as anticipated.  I provided 23 minutes of non-face-to-face time during this encounter.   Einar Pheasant, MD

## 2021-02-02 ENCOUNTER — Telehealth: Payer: Self-pay | Admitting: Internal Medicine

## 2021-02-02 NOTE — Telephone Encounter (Signed)
LMTCB and schedule fasting labs in 3-4wks and a CPE in 2-3 m

## 2021-02-06 ENCOUNTER — Encounter: Payer: Self-pay | Admitting: Internal Medicine

## 2021-02-06 NOTE — Assessment & Plan Note (Signed)
Followed by oncology.  On femara.

## 2021-02-06 NOTE — Assessment & Plan Note (Signed)
Followed by neurology.  Stable  

## 2021-02-06 NOTE — Assessment & Plan Note (Signed)
Continue prolia injections.

## 2021-02-06 NOTE — Assessment & Plan Note (Signed)
Stable. On protonix.

## 2021-02-06 NOTE — Assessment & Plan Note (Signed)
No increased sob reported.  Follow.

## 2021-02-06 NOTE — Assessment & Plan Note (Signed)
Continue citalopram.  Stable.  Increased stress recently as outlined.  Has good support.  Does not feel needs any further intervention.  Follow.

## 2021-02-06 NOTE — Assessment & Plan Note (Signed)
Avoid antiinflammatories.  Follow metabolic panel.  

## 2021-02-06 NOTE — Assessment & Plan Note (Signed)
On pravastatin.  Follow lipid panel and liver function tests.   

## 2021-02-06 NOTE — Assessment & Plan Note (Signed)
Continue hctz, metoprolol, amlodipine and losartan.  Pressure as outlined.  Follow pressures.  Follow metabolic panel.

## 2021-02-06 NOTE — Assessment & Plan Note (Signed)
On pravastatin.  Low cholesterol diet and exercise.  Follow lipid panel and liver function tests.   

## 2021-02-06 NOTE — Assessment & Plan Note (Signed)
Follow cbc.  Being followed by hematology.  °

## 2021-02-06 NOTE — Assessment & Plan Note (Signed)
Low carb diet and exercise.  Follow met b and a1c.  

## 2021-02-07 DIAGNOSIS — R29898 Other symptoms and signs involving the musculoskeletal system: Secondary | ICD-10-CM | POA: Diagnosis not present

## 2021-02-07 DIAGNOSIS — G8929 Other chronic pain: Secondary | ICD-10-CM | POA: Diagnosis not present

## 2021-02-07 DIAGNOSIS — M545 Low back pain, unspecified: Secondary | ICD-10-CM | POA: Diagnosis not present

## 2021-02-07 DIAGNOSIS — F32A Depression, unspecified: Secondary | ICD-10-CM | POA: Diagnosis not present

## 2021-02-07 DIAGNOSIS — R251 Tremor, unspecified: Secondary | ICD-10-CM | POA: Diagnosis not present

## 2021-02-07 DIAGNOSIS — M542 Cervicalgia: Secondary | ICD-10-CM | POA: Diagnosis not present

## 2021-02-07 DIAGNOSIS — G479 Sleep disorder, unspecified: Secondary | ICD-10-CM | POA: Diagnosis not present

## 2021-02-07 DIAGNOSIS — R519 Headache, unspecified: Secondary | ICD-10-CM | POA: Diagnosis not present

## 2021-02-07 DIAGNOSIS — M25511 Pain in right shoulder: Secondary | ICD-10-CM | POA: Diagnosis not present

## 2021-02-07 DIAGNOSIS — R413 Other amnesia: Secondary | ICD-10-CM | POA: Diagnosis not present

## 2021-03-11 ENCOUNTER — Telehealth: Payer: Self-pay

## 2021-03-11 NOTE — Telephone Encounter (Signed)
Called and left message that patient has VV on Monday and  that we needed to talk with her about her medications, allegries. Instructed her to call us back.

## 2021-03-14 ENCOUNTER — Other Ambulatory Visit: Payer: Self-pay

## 2021-03-14 ENCOUNTER — Ambulatory Visit: Payer: Medicare Other | Attending: Pain Medicine | Admitting: Pain Medicine

## 2021-03-14 DIAGNOSIS — M545 Low back pain, unspecified: Secondary | ICD-10-CM

## 2021-03-14 DIAGNOSIS — M79604 Pain in right leg: Secondary | ICD-10-CM | POA: Diagnosis not present

## 2021-03-14 DIAGNOSIS — M5137 Other intervertebral disc degeneration, lumbosacral region: Secondary | ICD-10-CM | POA: Diagnosis not present

## 2021-03-14 DIAGNOSIS — G8929 Other chronic pain: Secondary | ICD-10-CM

## 2021-03-14 DIAGNOSIS — M47816 Spondylosis without myelopathy or radiculopathy, lumbar region: Secondary | ICD-10-CM | POA: Diagnosis not present

## 2021-03-14 DIAGNOSIS — M47817 Spondylosis without myelopathy or radiculopathy, lumbosacral region: Secondary | ICD-10-CM

## 2021-03-14 NOTE — Patient Instructions (Signed)
____________________________________________________________________________________________  Preparing for Procedure with Sedation  Procedure appointments are limited to planned procedures: . No Prescription Refills. . No disability issues will be discussed. . No medication changes will be discussed.  Instructions: . Oral Intake: Do not eat or drink anything for at least 8 hours prior to your procedure. (Exception: Blood Pressure Medication. See below.) . Transportation: Unless otherwise stated by your physician, you may drive yourself after the procedure. . Blood Pressure Medicine: Do not forget to take your blood pressure medicine with a sip of water the morning of the procedure. If your Diastolic (lower reading)is above 100 mmHg, elective cases will be cancelled/rescheduled. . Blood thinners: These will need to be stopped for procedures. Notify our staff if you are taking any blood thinners. Depending on which one you take, there will be specific instructions on how and when to stop it. . Diabetics on insulin: Notify the staff so that you can be scheduled 1st case in the morning. If your diabetes requires high dose insulin, take only  of your normal insulin dose the morning of the procedure and notify the staff that you have done so. . Preventing infections: Shower with an antibacterial soap the morning of your procedure. . Build-up your immune system: Take 1000 mg of Vitamin C with every meal (3 times a day) the day prior to your procedure. . Antibiotics: Inform the staff if you have a condition or reason that requires you to take antibiotics before dental procedures. . Pregnancy: If you are pregnant, call and cancel the procedure. . Sickness: If you have a cold, fever, or any active infections, call and cancel the procedure. . Arrival: You must be in the facility at least 30 minutes prior to your scheduled procedure. . Children: Do not bring children with you. . Dress appropriately:  Bring dark clothing that you would not mind if they get stained. . Valuables: Do not bring any jewelry or valuables.  Reasons to call and reschedule or cancel your procedure: (Following these recommendations will minimize the risk of a serious complication.) . Surgeries: Avoid having procedures within 2 weeks of any surgery. (Avoid for 2 weeks before or after any surgery). . Flu Shots: Avoid having procedures within 2 weeks of a flu shots or . (Avoid for 2 weeks before or after immunizations). . Barium: Avoid having a procedure within 7-10 days after having had a radiological study involving the use of radiological contrast. (Myelograms, Barium swallow or enema study). . Heart attacks: Avoid any elective procedures or surgeries for the initial 6 months after a "Myocardial Infarction" (Heart Attack). . Blood thinners: It is imperative that you stop these medications before procedures. Let us know if you if you take any blood thinner.  . Infection: Avoid procedures during or within two weeks of an infection (including chest colds or gastrointestinal problems). Symptoms associated with infections include: Localized redness, fever, chills, night sweats or profuse sweating, burning sensation when voiding, cough, congestion, stuffiness, runny nose, sore throat, diarrhea, nausea, vomiting, cold or Flu symptoms, recent or current infections. It is specially important if the infection is over the area that we intend to treat. . Heart and lung problems: Symptoms that may suggest an active cardiopulmonary problem include: cough, chest pain, breathing difficulties or shortness of breath, dizziness, ankle swelling, uncontrolled high or unusually low blood pressure, and/or palpitations. If you are experiencing any of these symptoms, cancel your procedure and contact your primary care physician for an evaluation.  Remember:  Regular Business hours are:    Monday to Thursday 8:00 AM to 4:00 PM  Provider's  Schedule: Merel Santoli, MD:  Procedure days: Tuesday and Thursday 7:30 AM to 4:00 PM  Bilal Lateef, MD:  Procedure days: Monday and Wednesday 7:30 AM to 4:00 PM ____________________________________________________________________________________________    

## 2021-03-14 NOTE — Progress Notes (Signed)
Patient: Wanda Martinez  Service Category: E/M  Provider: Gaspar Cola, MD  DOB: [birth year]  DOS: 03/14/2021  Location: Office  MRN: [id number]  Setting: Ambulatory outpatient  Referring Provider: Einar Pheasant, MD  Type: Established Patient  Specialty: Interventional Pain Management  PCP: Einar Pheasant, MD  Location: Remote location  Delivery: TeleHealth     Virtual Encounter - Pain Management PROVIDER NOTE: Information contained herein reflects review and annotations entered in association with encounter. Interpretation of such information and data should be left to medically-trained personnel. Information provided to patient can be located elsewhere in the medical record under "Patient Instructions". Document created using STT-dictation technology, any transcriptional errors that may result from process are unintentional.    Contact & Pharmacy Preferred: [phone] Home: [phone] (home) Mobile: [phone] (mobile) E-mail: No e-mail address on record  [facility] [place], [place] - [address] [place] [address] Phone: [phone] Fax: [phone]   Pre-screening  Wanda Martinez offered "in-person" vs "virtual" encounter. She indicated preferring virtual for this encounter.   Reason COVID-19*  Social distancing based on CDC and AMA recommendations.   I contacted Wanda Martinez on 03/14/2021 via telephone.      I clearly identified myself as Gaspar Cola, MD. I verified that I was speaking with the correct person using two identifiers (Name: Wanda Martinez, and date of birth: [birth year]).  Consent I sought verbal advanced consent from Wanda Martinez for virtual visit interactions. I informed Wanda Martinez of possible security and privacy concerns, risks, and limitations associated with providing "not-in-person" medical evaluation and management services. I also informed Wanda Martinez of the availability of  "in-person" appointments. Finally, I informed her that there would be a charge for the virtual visit and that she could be  personally, fully or partially, financially responsible for it. Ms. Colan expressed understanding and agreed to proceed.   Historic Elements   Wanda Martinez is a 86 y.o. year old, female patient evaluated today after our last contact on 12/21/2020. Wanda Martinez  has a past medical history of Acute postoperative pain (10/25/2017), Anemia, Arm pain (07/26/2015), Arthritis, Arthritis, degenerative (03/26/2014), Back pain (11/01/2013), Bladder infection (06/2018), Breast cancer ([place]), Breast cancer ([place]), CHEST PAIN (04/29/2010), Chronic cystitis, Cystocele (02/22/2013), Cystocele, midline (08/19/2013), Degeneration of intervertebral disc of lumbosacral region (03/26/2014), DYSPNEA (04/29/2010), Enthesopathy of hip (03/26/2014), GERD (gastroesophageal reflux disease), Hiatal hernia, HTN (hypertension), Hypokalemia (06/2018), Hyponatremia (06/2018), LBP (low back pain) (03/26/2014), Neck pain (11/01/2013), Parkinson disease ([place]), Pneumonia (06/2018), Sinusitis (02/07/2015), Skin lesions (07/12/2014), and Urinary incontinence. She also  has a past surgical history that includes Tonsillectomy and adenoidectomy (81); Vesicovaginal fistula closure w/ TAH (1983); Breast surgery (1986); Breast enhancement surgery (1987); Breast implant removal; Breast implant removal (Right, 08/29/2012); Mastectomy (08/2012); Abdominal hysterectomy; Colonoscopy with propofol (N/A, 09/13/2016); and Breast biopsy (2013). Wanda Martinez has a current medication list which includes the following prescription(s): amlodipine, aspirin, calcium carbonate, citalopram, cyclobenzaprine, donepezil, gabapentin, glucosamine sulfate, hydrochlorothiazide, hydrocodone-acetaminophen, [START ON 03/23/2021] hydrocodone-acetaminophen, letrozole, losartan, lubiprostone, magnesium oxide, melatonin, meloxicam, metoprolol succinate, multivitamin, nystatin,  omega-3, omeprazole, pantoprazole, and pravastatin. She  reports that she has never smoked. She has never used smokeless tobacco. She reports that she does not drink alcohol and does not use drugs. Wanda Martinez is allergic to sulfa antibiotics and vesicare [solifenacin].   HPI  Today, she is being contacted for worsening of previously known (established) problem.  The patient indicates having a flareup of her low back  pain, bilaterally, with the right side being worse than the left.  She is also experiencing pain going down the leg but she states that the low back pain is worse than the leg pain.  In the case of the lower extremity pain goes down through the back of the leg to the ankle, but is not getting into the foot.  Today the patient was asked to perform a hyperextension and rotation maneuver which seems to have worsened some of her pain in the lower back.  She does have a history of bilateral lumbar facet syndrome and in the past we have treated this with lumbar facet blocks.  These have also been treated with radiofrequency ablation, with the left side last having been treated on 02/12/2018 and the right side on 06/04/2018.  Clearly it has been more than 18 months since the radiofrequencies and it is very likely that they have worn off.  I will be bringing them in for a diagnostic bilateral lumbar facet block and if she gets good relief, then we will plan on repeating the radiofrequency ablation.  The plan was shared with the patient who understood and agreed.  Pharmacotherapy Assessment  Analgesic: Hydrocodone/APAP 5/325, 1 tab PO q 8 hrs (15 mg/day of hydrocodone) MME/day:15mg /day.   Monitoring: [place] PMP: PDMP reviewed during this encounter.       Pharmacotherapy: No side-effects or adverse reactions reported. Compliance: No problems identified. Effectiveness: Clinically acceptable. Plan: Refer to "POC".  UDS:  Summary  Date Value Ref Range Status  07/07/2020 Note  Final    Comment:     ==================================================================== [employer] [facility] 13 (MW) ==================================================================== Test                             Result       Flag       Units  Drug Present and Declared for Prescription Verification   Hydrocodone                    389          EXPECTED   ng/mg creat   Dihydrocodeine                 131          EXPECTED   ng/mg creat   Norhydrocodone                 1443         EXPECTED   ng/mg creat    Sources of hydrocodone include scheduled prescription medications.    Dihydrocodeine and norhydrocodone are expected metabolites of    hydrocodone. Dihydrocodeine is also available as a scheduled    prescription medication.  ==================================================================== Test                      Result    Flag   Units      Ref Range   Creatinine              102              mg/dL      >=20 ==================================================================== Declared Medications:  The flagging and interpretation on this report are based on the  following declared medications.  Unexpected results may arise from  inaccuracies in the declared medications.   **Note: The testing scope of this panel includes these medications:   Hydrocodone (Norco)   **Note: The testing  scope of this panel does not include the  following reported medications:   Acetaminophen (Norco)  Amlodipine (Norvasc)  Aspirin  Calcium  Citalopram (Celexa)  Cyclobenzaprine (Flexeril)  Donepezil (Aricept)  Gabapentin (Neurontin)  Hydrochlorothiazide (Hydrodiuril)  Letrozole (Femara)  Losartan (Cozaar)  Lubiprostone (Amitiza)  Magnesium (Mag-Ox)  Meloxicam (Mobic)  Metoprolol (Toprol)  Multivitamin  Nitrofurantoin (Macrobid)  Nystatin (Mycostatin)  Omega-3 Fatty Acids  Omeprazole (Prilosec)  Pantoprazole (Protonix)  Pravastatin  (Pravachol) ==================================================================== For clinical consultation, please call [phone]. ====================================================================     Laboratory Chemistry Profile   Renal Lab Results  Component Value Date   BUN 25 (H) 01/27/2021   CREATININE 1.15 (H) 01/27/2021   BCR 20 06/11/2020   GFR 44.26 (L) 01/05/2020   GFRAA >60 07/27/2020   GFRNONAA 47 (L) 01/27/2021     Hepatic Lab Results  Component Value Date   AST 20 06/11/2020   ALT 19 06/11/2020   ALBUMIN 4.4 10/17/2019   ALKPHOS 50 10/17/2019     Electrolytes Lab Results  Component Value Date   NA 134 (L) 01/27/2021   K 3.7 01/27/2021   CL 95 (L) 01/27/2021   CALCIUM 9.2 01/27/2021   MG 2.2 07/17/2018     Bone Lab Results  Component Value Date   25OHVITD1 42 07/17/2018   25OHVITD2 <1.0 07/17/2018   25OHVITD3 42 07/17/2018     Inflammation (CRP: Acute Phase) (ESR: Chronic Phase) Lab Results  Component Value Date   CRP 3 07/17/2018   ESRSEDRATE 27 07/17/2018       Note: Above Lab results reviewed.  Imaging  DG PAIN CLINIC C-ARM 1-60 MIN NO REPORT Fluoro was used, but no Radiologist interpretation will be provided.  Please refer to "NOTES" tab for provider progress note.  Assessment  The primary encounter diagnosis was Chronic low back pain (1ry area of Pain) (Bilateral) (R>L) w/o sciatica. Diagnoses of Lumbar facet syndrome (Bilateral) (R>L), Lumbar facet hypertrophy (Multilevel) (Bilateral), Spondylosis without myelopathy or radiculopathy, lumbosacral region, DDD (degenerative disc disease), lumbosacral, and Chronic lower extremity pain (2ry area of Pain) (Right) were also pertinent to this visit.  Plan of Care  Problem-specific:  No problem-specific Assessment & Plan notes found for this encounter.  Wanda Martinez has a current medication list which includes the following long-term medication(s): calcium carbonate,  amlodipine, cyclobenzaprine, donepezil, gabapentin, hydrochlorothiazide, hydrocodone-acetaminophen, [START ON 03/23/2021] hydrocodone-acetaminophen, losartan, magnesium oxide, meloxicam, metoprolol succinate, omeprazole, pantoprazole, and pravastatin.  Pharmacotherapy (Medications Ordered): No orders of the defined types were placed in this encounter.  Orders:  Orders Placed This Encounter  Procedures  . LUMBAR FACET(MEDIAL BRANCH NERVE BLOCK) MBNB    Standing Status:   Future    Standing Expiration Date:   04/14/2021    Scheduling Instructions:     Procedure: Lumbar facet block (AKA.: Lumbosacral medial branch nerve block)     Side: Bilateral     Level: L3-4, L4-5, & L5-S1 Facets (L2, L3, L4, L5, & S1 Medial Branch Nerves)     Sedation: With Sedation.     Timeframe: ASAA    Order Specific Question:   Where will this procedure be performed?    Answer:   [facility]   Follow-up plan:   Return for Procedure (w/ sedation): (B) L-FCT BLK .      Interventional Therapies  Risk  Complexity Considerations:   WNL   Planned  Pending:   Pending further evaluation   Under consideration:   Diagnosticbilateral cervical facet block  Possiblebilateral cervical facet  RFA    Completed:   Therapeutic right thoracic back TPI x2 (10/17/2018)  Therapeutic left thoracic back TPI x1 (10/17/2018) Therapeutic right quadratus lumborum and erector spinae muscle TPI x1 (02/12/2018) Therapeutic right lumbar facet block x7 (12/21/2020)  Therapeutic left lumbar facet block x3 (08/09/2017)  Therapeutic right lumbar facet RFA x2 (06/04/2018) (100/90/75/75-100) Therapeutic left lumbar facet RFA x1 (02/12/2018)  Diagnostic right SI joint block x3 (12/21/2020)  Diagnostic left SI joint block x1 (08/09/2017)  Therapeutic right SI joint RFA x3 (06/05/2019)  Therapeutic midline L2-3 LESI x1 (10/04/2017) (100/100/85/>50) Therapeutic right L3-4 LESI x2 (11/04/2020) Therapeutic right L4-5 LESI x1  (05/20/2020) Therapeutic right L5-S1 LESI x2 (12/04/2019) Therapeutic right L5 TFESI x3 (12/04/2019)  Therapeutic right S1 TFESI x1 (11/04/2020) Diagnostic bilateral superficial trochanteric bursa injection x1 (04/30/2018)  Diagnostic right IA hip injection x1 (02/13/2019)    Palliative options:   None at this time    Recent Visits Date Type Provider Dept  12/21/20 Procedure visit Milinda Pointer, MD [facility]  Showing recent visits within past 90 days and meeting all other requirements Today's Visits Date Type Provider Dept  03/14/21 Telemedicine Milinda Pointer, MD [facility]  Showing today's visits and meeting all other requirements Future Appointments Date Type Provider Dept  04/11/21 Appointment Milinda Pointer, MD [facility]  Showing future appointments within next 90 days and meeting all other requirements  I discussed the assessment and treatment plan with the patient. The patient was provided an opportunity to ask questions and all were answered. The patient agreed with the plan and demonstrated an understanding of the instructions.  Patient advised to call back or seek an in-person evaluation if the symptoms or condition worsens.  Duration of encounter: 15 minutes.  Note by: Gaspar Cola, MD Date: 03/14/2021; Time: 8:33 AM

## 2021-03-15 ENCOUNTER — Ambulatory Visit
Admission: RE | Admit: 2021-03-15 | Discharge: 2021-03-15 | Disposition: A | Discharge status: Home / Self Care | Payer: Medicare Other | Source: Ambulatory Visit | Attending: Pain Medicine | Admitting: Pain Medicine

## 2021-03-15 ENCOUNTER — Other Ambulatory Visit: Payer: Self-pay

## 2021-03-15 ENCOUNTER — Encounter: Payer: Self-pay | Admitting: Pain Medicine

## 2021-03-15 ENCOUNTER — Ambulatory Visit (HOSPITAL_BASED_OUTPATIENT_CLINIC_OR_DEPARTMENT_OTHER): Payer: Medicare Other | Admitting: Pain Medicine

## 2021-03-15 VITALS — BP 133/81 | HR 55 | Temp 97.2°F | Resp 14 | Ht 64.0 in | Wt 185.0 lb

## 2021-03-15 DIAGNOSIS — M47816 Spondylosis without myelopathy or radiculopathy, lumbar region: Secondary | ICD-10-CM

## 2021-03-15 DIAGNOSIS — M5137 Other intervertebral disc degeneration, lumbosacral region: Secondary | ICD-10-CM | POA: Diagnosis not present

## 2021-03-15 DIAGNOSIS — G8929 Other chronic pain: Secondary | ICD-10-CM

## 2021-03-15 DIAGNOSIS — M47817 Spondylosis without myelopathy or radiculopathy, lumbosacral region: Secondary | ICD-10-CM | POA: Insufficient documentation

## 2021-03-15 DIAGNOSIS — M545 Low back pain, unspecified: Secondary | ICD-10-CM | POA: Diagnosis not present

## 2021-03-15 DIAGNOSIS — M8938 Hypertrophy of bone, other site: Secondary | ICD-10-CM

## 2021-03-15 DIAGNOSIS — M25561 Pain in right knee: Secondary | ICD-10-CM | POA: Diagnosis not present

## 2021-03-15 DIAGNOSIS — M5136 Other intervertebral disc degeneration, lumbar region: Secondary | ICD-10-CM

## 2021-03-15 DIAGNOSIS — M1711 Unilateral primary osteoarthritis, right knee: Secondary | ICD-10-CM | POA: Diagnosis not present

## 2021-03-15 MED ORDER — ROPIVACAINE HCL 2 MG/ML IJ SOLN
18.0000 mL | Freq: Once | INTRAMUSCULAR | Status: AC
  Administered 2021-03-15: 18 mL via PERINEURAL

## 2021-03-15 MED ORDER — TRIAMCINOLONE ACETONIDE 40 MG/ML IJ SUSP
80.0000 mg | Freq: Once | INTRAMUSCULAR | Status: AC
  Administered 2021-03-15: 80 mg

## 2021-03-15 MED ORDER — LIDOCAINE HCL 2 % IJ SOLN
20.0000 mL | Freq: Once | INTRAMUSCULAR | Status: AC
  Administered 2021-03-15: 400 mg

## 2021-03-15 MED ORDER — TRIAMCINOLONE ACETONIDE 40 MG/ML IJ SUSP
INTRAMUSCULAR | Status: AC
  Filled 2021-03-15: qty 2

## 2021-03-15 MED ORDER — ROPIVACAINE HCL 2 MG/ML IJ SOLN
INTRAMUSCULAR | Status: AC
  Filled 2021-03-15: qty 20

## 2021-03-15 MED ORDER — LIDOCAINE HCL 2 % IJ SOLN
INTRAMUSCULAR | Status: AC
  Filled 2021-03-15: qty 20

## 2021-03-15 NOTE — Patient Instructions (Signed)
____________________________________________________________________________________________  Post-Procedure Discharge Instructions  Instructions:  Apply ice:   Purpose: This will minimize any swelling and discomfort after procedure.   When: Day of procedure, as soon as you get home.  How: Fill a plastic sandwich bag with crushed ice. Cover it with a small towel and apply to injection site.  How long: (15 min on, 15 min off) Apply for 15 minutes then remove x 15 minutes.  Repeat sequence on day of procedure, until you go to bed.  Apply heat:   Purpose: To treat any soreness and discomfort from the procedure.  When: Starting the next day after the procedure.  How: Apply heat to procedure site starting the day following the procedure.  How long: May continue to repeat daily, until discomfort goes away.  Food intake: Start with clear liquids (like water) and advance to regular food, as tolerated.   Physical activities: Keep activities to a minimum for the first 8 hours after the procedure. After that, then as tolerated.  Driving: If you have received any sedation, be responsible and do not drive. You are not allowed to drive for 24 hours after having sedation.  Blood thinner: (Applies only to those taking blood thinners) You may restart your blood thinner 6 hours after your procedure.  Insulin: (Applies only to Diabetic patients taking insulin) As soon as you can eat, you may resume your normal dosing schedule.  Infection prevention: Keep procedure site clean and dry. Shower daily and clean area with soap and water.  Post-procedure Pain Diary: Extremely important that this be done correctly and accurately. Recorded information will be used to determine the next step in treatment. For the purpose of accuracy, follow these rules:  Evaluate only the area treated. Do not report or include pain from an untreated area. For the purpose of this evaluation, ignore all other areas of pain,  except for the treated area.  After your procedure, avoid taking a long nap and attempting to complete the pain diary after you wake up. Instead, set your alarm clock to go off every hour, on the hour, for the initial 8 hours after the procedure. Document the duration of the numbing medicine, and the relief you are getting from it.  Do not go to sleep and attempt to complete it later. It will not be accurate. If you received sedation, it is likely that you were given a medication that may cause amnesia. Because of this, completing the diary at a later time may cause the information to be inaccurate. This information is needed to plan your care.  Follow-up appointment: Keep your post-procedure follow-up evaluation appointment after the procedure (usually 2 weeks for most procedures, 6 weeks for radiofrequencies). DO NOT FORGET to bring you pain diary with you.   Expect: (What should I expect to see with my procedure?)  From numbing medicine (AKA: Local Anesthetics): Numbness or decrease in pain. You may also experience some weakness, which if present, could last for the duration of the local anesthetic.  Onset: Full effect within 15 minutes of injected.  Duration: It will depend on the type of local anesthetic used. On the average, 1 to 8 hours.   From steroids (Applies only if steroids were used): Decrease in swelling or inflammation. Once inflammation is improved, relief of the pain will follow.  Onset of benefits: Depends on the amount of swelling present. The more swelling, the longer it will take for the benefits to be seen. In some cases, up to 10 days.    Duration: Steroids will stay in the system x 2 weeks. Duration of benefits will depend on multiple posibilities including persistent irritating factors.  Side-effects: If present, they may typically last 2 weeks (the duration of the steroids).  Frequent: Cramps (if they occur, drink Gatorade and take over-the-counter Magnesium 450-500 mg  once to twice a day); water retention with temporary weight gain; increases in blood sugar; decreased immune system response; increased appetite.  Occasional: Facial flushing (red, warm cheeks); mood swings; menstrual changes.  Uncommon: Long-term decrease or suppression of natural hormones; bone thinning. (These are more common with higher doses or more frequent use. This is why we prefer that our patients avoid having any injection therapies in other practices.)   Very Rare: Severe mood changes; psychosis; aseptic necrosis.  From procedure: Some discomfort is to be expected once the numbing medicine wears off. This should be minimal if ice and heat are applied as instructed.  Call if: (When should I call?)  You experience numbness and weakness that gets worse with time, as opposed to wearing off.  New onset bowel or bladder incontinence. (Applies only to procedures done in the spine)  Emergency Numbers:  Durning business hours (Monday - Thursday, 8:00 AM - 4:00 PM) (Friday, 9:00 AM - 12:00 Noon): [phone]  After hours: [phone]  NOTE: If you are having a problem and are unable connect with, or to talk to a provider, then go to your nearest urgent care or emergency department. If the problem is serious and urgent, please call 911. ____________________________________________________________________________________________   ____________________________________________________________________________________________  Preparing for your procedure (without sedation)  Procedure appointments are limited to planned procedures: . No Prescription Refills. . No disability issues will be discussed. . No medication changes will be discussed.  Instructions: . Oral Intake: Do not eat or drink anything for at least 6 hours prior to your procedure. (Exception: Blood Pressure Medication. See below.) . Transportation: Unless otherwise stated by your physician, you may drive yourself  after the procedure. . Blood Pressure Medicine: Do not forget to take your blood pressure medicine with a sip of water the morning of the procedure. If your Diastolic (lower reading)is above 100 mmHg, elective cases will be cancelled/rescheduled. . Blood thinners: These will need to be stopped for procedures. Notify our staff if you are taking any blood thinners. Depending on which one you take, there will be specific instructions on how and when to stop it. . Diabetics on insulin: Notify the staff so that you can be scheduled 1st case in the morning. If your diabetes requires high dose insulin, take only  of your normal insulin dose the morning of the procedure and notify the staff that you have done so. . Preventing infections: Shower with an antibacterial soap the morning of your procedure.  . Build-up your immune system: Take 1000 mg of Vitamin C with every meal (3 times a day) the day prior to your procedure. Marland Kitchen Antibiotics: Inform the staff if you have a condition or reason that requires you to take antibiotics before dental procedures. . Pregnancy: If you are pregnant, call and cancel the procedure. . Sickness: If you have a cold, fever, or any active infections, call and cancel the procedure. . Arrival: You must be in the facility at least 30 minutes prior to your scheduled procedure. . Children: Do not bring any children with you. . Dress appropriately: Bring dark clothing that you would not mind if they get stained. . Valuables: Do not bring any jewelry  or valuables.  Reasons to call and reschedule or cancel your procedure: (Following these recommendations will minimize the risk of a serious complication.) . Surgeries: Avoid having procedures within 2 weeks of any surgery. (Avoid for 2 weeks before or after any surgery). . Flu Shots: Avoid having procedures within 2 weeks of a flu shots or . (Avoid for 2 weeks before or after immunizations). . Barium: Avoid having a procedure within 7-10  days after having had a radiological study involving the use of radiological contrast. (Myelograms, Barium swallow or enema study). . Heart attacks: Avoid any elective procedures or surgeries for the initial 6 months after a "Myocardial Infarction" (Heart Attack). . Blood thinners: It is imperative that you stop these medications before procedures. Let us know if you if you take any blood thinner.  . Infection: Avoid procedures during or within two weeks of an infection (including chest colds or gastrointestinal problems). Symptoms associated with infections include: Localized redness, fever, chills, night sweats or profuse sweating, burning sensation when voiding, cough, congestion, stuffiness, runny nose, sore throat, diarrhea, nausea, vomiting, cold or Flu symptoms, recent or current infections. It is specially important if the infection is over the area that we intend to treat. Marland Kitchen Heart and lung problems: Symptoms that may suggest an active cardiopulmonary problem include: cough, chest pain, breathing difficulties or shortness of breath, dizziness, ankle swelling, uncontrolled high or unusually low blood pressure, and/or palpitations. If you are experiencing any of these symptoms, cancel your procedure and contact your primary care physician for an evaluation.  Remember:  Regular Business hours are:  Monday to Thursday 8:00 AM to 4:00 PM  Provider's Schedule: Milinda Pointer, MD:  Procedure days: Tuesday and Thursday 7:30 AM to 4:00 PM  Gillis Santa, MD:  Procedure days: Monday and Wednesday 7:30 AM to 4:00 PM ____________________________________________________________________________________________   ____________________________________________________________________________________________  General Risks and Possible Complications  Patient Responsibilities: It is important that you read this as it is part of your informed consent. It is our duty to inform you of the risks and  possible complications associated with treatments offered to you. It is your responsibility as a patient to read this and to ask questions about anything that is not clear or that you believe was not covered in this document.  Patient's Rights: You have the right to refuse treatment. You also have the right to change your mind, even after initially having agreed to have the treatment done. However, under this last option, if you wait until the last second to change your mind, you may be charged for the materials used up to that point.  Introduction: Medicine is not an Chief Strategy Officer. Everything in Medicine, including the lack of treatment(s), carries the potential for danger, harm, or loss (which is by definition: Risk). In Medicine, a complication is a secondary problem, condition, or disease that can aggravate an already existing one. All treatments carry the risk of possible complications. The fact that a side effects or complications occurs, does not imply that the treatment was conducted incorrectly. It must be clearly understood that these can happen even when everything is done following the highest safety standards.  No treatment: You can choose not to proceed with the proposed treatment alternative. The "PRO(s)" would include: avoiding the risk of complications associated with the therapy. The "CON(s)" would include: not getting any of the treatment benefits. These benefits fall under one of three categories: diagnostic; therapeutic; and/or palliative. Diagnostic benefits include: getting information which can ultimately lead to improvement  of the disease or symptom(s). Therapeutic benefits are those associated with the successful treatment of the disease. Finally, palliative benefits are those related to the decrease of the primary symptoms, without necessarily curing the condition (example: decreasing the pain from a flare-up of a chronic condition, such as incurable terminal cancer).  General  Risks and Complications: These are associated to most interventional treatments. They can occur alone, or in combination. They fall under one of the following six (6) categories: no benefit or worsening of symptoms; bleeding; infection; nerve damage; allergic reactions; and/or death. 1. No benefits or worsening of symptoms: In Medicine there are no guarantees, only probabilities. No healthcare provider can ever guarantee that a medical treatment will work, they can only state the probability that it may. Furthermore, there is always the possibility that the condition may worsen, either directly, or indirectly, as a consequence of the treatment. 2. Bleeding: This is more common if the patient is taking a blood thinner, either prescription or over the counter (example: Goody Powders, Fish oil, Aspirin, Garlic, etc.), or if suffering a condition associated with impaired coagulation (example: Hemophilia, cirrhosis of the liver, low platelet counts, etc.). However, even if you do not have one on these, it can still happen. If you have any of these conditions, or take one of these drugs, make sure to notify your treating physician. 3. Infection: This is more common in patients with a compromised immune system, either due to disease (example: diabetes, cancer, human immunodeficiency virus [HIV], etc.), or due to medications or treatments (example: therapies used to treat cancer and rheumatological diseases). However, even if you do not have one on these, it can still happen. If you have any of these conditions, or take one of these drugs, make sure to notify your treating physician. 4. Nerve Damage: This is more common when the treatment is an invasive one, but it can also happen with the use of medications, such as those used in the treatment of cancer. The damage can occur to small secondary nerves, or to large primary ones, such as those in the spinal cord and brain. This damage may be temporary or permanent and it  may lead to impairments that can range from temporary numbness to permanent paralysis and/or brain death. 5. Allergic Reactions: Any time a substance or material comes in contact with our body, there is the possibility of an allergic reaction. These can range from a mild skin rash (contact dermatitis) to a severe systemic reaction (anaphylactic reaction), which can result in death. 6. Death: In general, any medical intervention can result in death, most of the time due to an unforeseen complication. ____________________________________________________________________________________________

## 2021-03-15 NOTE — Progress Notes (Signed)
Safety precautions to be maintained throughout the outpatient stay will include: orient to surroundings, keep bed in low position, maintain call bell within reach at all times, provide assistance with transfer out of bed and ambulation.  

## 2021-03-15 NOTE — Progress Notes (Signed)
PROVIDER NOTE: Information contained herein reflects review and annotations entered in association with encounter. Interpretation of such information and data should be left to medically-trained personnel. Information provided to patient can be located elsewhere in the medical record under "Patient Instructions". Document created using STT-dictation technology, any transcriptional errors that may result from process are unintentional.    Patient: Wanda Martinez  Service Category: Procedure  Provider: Gaspar Cola, MD  DOB: [birth year]  DOS: 03/15/2021  Location: [facility]  MRN: [id number]  Setting: Ambulatory - outpatient  Referring Provider: Einar Pheasant, MD  Type: Established Patient  Specialty: Interventional Pain Management  PCP: Einar Pheasant, MD   Primary Reason for Visit: Interventional Pain Management Treatment. CC: Back Pain  Procedure:          Anesthesia, Analgesia, Anxiolysis:  Type: Lumbar Facet, Medial Branch Block(s) #R8L4  Primary Purpose: Diagnostic Region: Posterolateral Lumbosacral Spine Level: L2, L3, L4, L5, & S1 Medial Branch Level(s). Injecting these levels blocks the L3-4, L4-5, and L5-S1 lumbar facet joints. Laterality: Bilateral  Type: Local Anesthesia Indication(s): Analgesia         Route: Infiltration ([place]/IM) IV Access: Declined Sedation: Declined  Local Anesthetic: Lidocaine 1-2%  Position: Prone   Indications: 1. Lumbar facet syndrome (Bilateral) (R>L)   2. Lumbar facet hypertrophy (Multilevel) (Bilateral)   3. Spondylosis without myelopathy or radiculopathy, lumbosacral region   4. DDD (degenerative disc disease), lumbosacral   5. Chronic low back pain (1ry area of Pain) (Bilateral) (R>L) w/o sciatica    Pain Score: Pre-procedure: 9 /10 Post-procedure: 0-No pain/10   The patient comes into the clinic today indicating that she still having pain over her right knee.  This has been there for longer than 3 months but  has never been evaluated over treated before.  Today we will be ordering some x-rays of the right knee and physical examination today shows some tenderness over the right patella.  Today I have encouraged the patient to pay a little bit of attention to where most of the pain is when she tries to walk.  She was not sure if the pain was more medial, lateral, or superior to the patella.  I did explain to her about the 3 compartments and she indicated that she will be paying more attention to it.  I will have her return in 2 weeks for postprocedure evaluation from today's palliative bilateral lumbar facet block and also to do the right knee injection.  We will take the opportunity to also take care of her medication management at that time.  Pre-op H&P Assessment:  Wanda Martinez is a 86 y.o. (year old), female patient, seen today for interventional treatment. She  has a past surgical history that includes Tonsillectomy and adenoidectomy (81); Vesicovaginal fistula closure w/ TAH (1983); Breast surgery (1986); Breast enhancement surgery (1987); Breast implant removal; Breast implant removal (Right, 08/29/2012); Mastectomy (08/2012); Abdominal hysterectomy; Colonoscopy with propofol (N/A, 09/13/2016); and Breast biopsy (2013). [name] has a current medication list which includes the following prescription(s): amlodipine, aspirin, calcium carbonate, citalopram, cyclobenzaprine, donepezil, gabapentin, glucosamine sulfate, hydrochlorothiazide, hydrocodone-acetaminophen, [START ON 03/23/2021] hydrocodone-acetaminophen, letrozole, losartan, lubiprostone, magnesium oxide, melatonin, meloxicam, metoprolol succinate, multivitamin, omega-3, omeprazole, pantoprazole, pravastatin, and nystatin. Her primarily concern today is the Back Pain  Initial Vital Signs:  Pulse/HCG Rate: (!) 55ECG Heart Rate: (!) 59 Temp: (!) 97.2 F (36.2 C) Resp: 17 BP: (!) 124/57 SpO2: 100 %  BMI: Estimated body mass index is 31.76 kg/m as  calculated from  the following:   Height as of this encounter: 5\' 4"  (1.626 m).   Weight as of this encounter: 185 lb (83.9 kg).  Risk Assessment: Allergies: Reviewed. She is allergic to sulfa antibiotics and vesicare [solifenacin].  Allergy Precautions: None required Coagulopathies: Reviewed. None identified.  Blood-thinner therapy: None at this time Active Infection(s): Reviewed. None identified. Wanda Martinez is afebrile  Site Confirmation: Ms. Hulsey was asked to confirm the procedure and laterality before marking the site Procedure checklist: Completed Consent: Before the procedure and under the influence of no sedative(s), amnesic(s), or anxiolytics, the patient was informed of the treatment options, risks and possible complications. To fulfill our ethical and legal obligations, as recommended by the American Medical Association's Code of Ethics, I have informed the patient of my clinical impression; the nature and purpose of the treatment or procedure; the risks, benefits, and possible complications of the intervention; the alternatives, including doing nothing; the risk(s) and benefit(s) of the alternative treatment(s) or procedure(s); and the risk(s) and benefit(s) of doing nothing. The patient was provided information about the general risks and possible complications associated with the procedure. These may include, but are not limited to: failure to achieve desired goals, infection, bleeding, organ or nerve damage, allergic reactions, paralysis, and death. In addition, the patient was informed of those risks and complications associated to Spine-related procedures, such as failure to decrease pain; infection (i.e.: Meningitis, epidural or intraspinal abscess); bleeding (i.e.: epidural hematoma, subarachnoid hemorrhage, or any other type of intraspinal or peri-dural bleeding); organ or nerve damage (i.e.: Any type of peripheral nerve, nerve root, or spinal cord injury) with subsequent damage to  sensory, motor, and/or autonomic systems, resulting in permanent pain, numbness, and/or weakness of one or several areas of the body; allergic reactions; (i.e.: anaphylactic reaction); and/or death. Furthermore, the patient was informed of those risks and complications associated with the medications. These include, but are not limited to: allergic reactions (i.e.: anaphylactic or anaphylactoid reaction(s)); adrenal axis suppression; blood sugar elevation that in diabetics may result in ketoacidosis or comma; water retention that in patients with history of congestive heart failure may result in shortness of breath, pulmonary edema, and decompensation with resultant heart failure; weight gain; swelling or edema; medication-induced neural toxicity; particulate matter embolism and blood vessel occlusion with resultant organ, and/or nervous system infarction; and/or aseptic necrosis of one or more joints. Finally, the patient was informed that Medicine is not an exact science; therefore, there is also the possibility of unforeseen or unpredictable risks and/or possible complications that may result in a catastrophic outcome. The patient indicated having understood very clearly. We have given the patient no guarantees and we have made no promises. Enough time was given to the patient to ask questions, all of which were answered to the patient's satisfaction. Ms. Crossland has indicated that she wanted to continue with the procedure. Attestation: I, the ordering provider, attest that I have discussed with the patient the benefits, risks, side-effects, alternatives, likelihood of achieving goals, and potential problems during recovery for the procedure that I have provided informed consent. Date  Time: 03/15/2021  9:11 AM  Pre-Procedure Preparation:  Monitoring: As per clinic protocol. Respiration, ETCO2, SpO2, BP, heart rate and rhythm monitor placed and checked for adequate function Safety Precautions: Patient was  assessed for positional comfort and pressure points before starting the procedure. Time-out: I initiated and conducted the "Time-out" before starting the procedure, as per protocol. The patient was asked to participate by confirming the accuracy of the "Time Out"  information. Verification of the correct person, site, and procedure were performed and confirmed by me, the nursing staff, and the patient. "Time-out" conducted as per Joint Commission's Universal Protocol (UP.01.01.01). Time: 0933  Description of Procedure:          Laterality: Bilateral. The procedure was performed in identical fashion on both sides. Levels:  L2, L3, L4, L5, & S1 Medial Branch Level(s) Area Prepped: Posterior Lumbosacral Region DuraPrep (Iodine Povacrylex [0.7% available iodine] and Isopropyl Alcohol, 74% w/w) Safety Precautions: Aspiration looking for blood return was conducted prior to all injections. At no point did we inject any substances, as a needle was being advanced. Before injecting, the patient was told to immediately notify me if she was experiencing any new onset of "ringing in the ears, or metallic taste in the mouth". No attempts were made at seeking any paresthesias. Safe injection practices and needle disposal techniques used. Medications properly checked for expiration dates. SDV (single dose vial) medications used. After the completion of the procedure, all disposable equipment used was discarded in the proper designated medical waste containers. Local Anesthesia: Protocol guidelines were followed. The patient was positioned over the fluoroscopy table. The area was prepped in the usual manner. The time-out was completed. The target area was identified using fluoroscopy. A 12-in long, straight, sterile hemostat was used with fluoroscopic guidance to locate the targets for each level blocked. Once located, the skin was marked with an approved surgical skin marker. Once all sites were marked, the skin  (epidermis, dermis, and hypodermis), as well as deeper tissues (fat, connective tissue and muscle) were infiltrated with a small amount of a short-acting local anesthetic, loaded on a 10cc syringe with a 25G, 1.5-in  Needle. An appropriate amount of time was allowed for local anesthetics to take effect before proceeding to the next step. Local Anesthetic: Lidocaine 2.0% The unused portion of the local anesthetic was discarded in the proper designated containers. Technical explanation of process:  L2 Medial Branch Nerve Block (MBB): The target area for the L2 medial branch is at the junction of the postero-lateral aspect of the superior articular process and the superior, posterior, and medial edge of the transverse process of L3. Under fluoroscopic guidance, a Quincke needle was inserted until contact was made with os over the superior postero-lateral aspect of the pedicular shadow (target area). After negative aspiration for blood, 0.5 mL of the nerve block solution was injected without difficulty or complication. The needle was removed intact. L3 Medial Branch Nerve Block (MBB): The target area for the L3 medial branch is at the junction of the postero-lateral aspect of the superior articular process and the superior, posterior, and medial edge of the transverse process of L4. Under fluoroscopic guidance, a Quincke needle was inserted until contact was made with os over the superior postero-lateral aspect of the pedicular shadow (target area). After negative aspiration for blood, 0.5 mL of the nerve block solution was injected without difficulty or complication. The needle was removed intact. L4 Medial Branch Nerve Block (MBB): The target area for the L4 medial branch is at the junction of the postero-lateral aspect of the superior articular process and the superior, posterior, and medial edge of the transverse process of L5. Under fluoroscopic guidance, a Quincke needle was inserted until contact was made  with os over the superior postero-lateral aspect of the pedicular shadow (target area). After negative aspiration for blood, 0.5 mL of the nerve block solution was injected without difficulty or complication. The needle was removed  intact. L5 Medial Branch Nerve Block (MBB): The target area for the L5 medial branch is at the junction of the postero-lateral aspect of the superior articular process and the superior, posterior, and medial edge of the sacral ala. Under fluoroscopic guidance, a Quincke needle was inserted until contact was made with os over the superior postero-lateral aspect of the pedicular shadow (target area). After negative aspiration for blood, 0.5 mL of the nerve block solution was injected without difficulty or complication. The needle was removed intact. S1 Medial Branch Nerve Block (MBB): The target area for the S1 medial branch is at the posterior and inferior 6 o'clock position of the L5-S1 facet joint. Under fluoroscopic guidance, the Quincke needle inserted for the L5 MBB was redirected until contact was made with os over the inferior and postero aspect of the sacrum, at the 6 o' clock position under the L5-S1 facet joint (Target area). After negative aspiration for blood, 0.5 mL of the nerve block solution was injected without difficulty or complication. The needle was removed intact.  Nerve block solution: 0.2% PF-Ropivacaine + Triamcinolone (40 mg/mL) diluted to a final concentration of 4 mg of Triamcinolone/mL of Ropivacaine The unused portion of the solution was discarded in the proper designated containers. Procedural Needles: 22-gauge, 5-inch, Quincke needles used for all levels.  Once the entire procedure was completed, the treated area was cleaned, making sure to leave some of the prepping solution back to take advantage of its long term bactericidal properties.   Illustration of the posterior view of the lumbar spine and the posterior neural structures. Laminae of L2  through S1 are labeled. DPRL5, dorsal primary ramus of L5; DPRS1, dorsal primary ramus of S1; DPR3, dorsal primary ramus of L3; FJ, facet (zygapophyseal) joint L3-L4; I, inferior articular process of L4; LB1, lateral branch of dorsal primary ramus of L1; IAB, inferior articular branches from L3 medial branch (supplies L4-L5 facet joint); IBP, intermediate branch plexus; MB3, medial branch of dorsal primary ramus of L3; NR3, third lumbar nerve root; S, superior articular process of L5; SAB, superior articular branches from L4 (supplies L4-5 facet joint also); TP3, transverse process of L3.  Vitals:   03/15/21 0931 03/15/21 0935 03/15/21 0940 03/15/21 0944  BP: (!) 136/106 (!) 155/83 140/78 133/81  Pulse:      Resp: 17 17 16 14   Temp:      SpO2: 100% 98% 99% 99%  Weight:      Height:         Start Time: 0933 hrs. End Time: 0945 hrs.  Imaging Guidance (Spinal):          Type of Imaging Technique: Fluoroscopy Guidance (Spinal) Indication(s): Assistance in needle guidance and placement for procedures requiring needle placement in or near specific anatomical locations not easily accessible without such assistance. Exposure Time: Please see nurses notes. Contrast: None used. Fluoroscopic Guidance: I was personally present during the use of fluoroscopy. "Tunnel Vision Technique" used to obtain the best possible view of the target area. Parallax error corrected before commencing the procedure. "Direction-depth-direction" technique used to introduce the needle under continuous pulsed fluoroscopy. Once target was reached, antero-posterior, oblique, and lateral fluoroscopic projection used confirm needle placement in all planes. Images permanently stored in EMR. Interpretation: No contrast injected. I personally interpreted the imaging intraoperatively. Adequate needle placement confirmed in multiple planes. Permanent images saved into the patient's record.  Antibiotic Prophylaxis:   Anti-infectives  (From admission, onward)   None     Indication(s): None identified  Post-operative Assessment:  Post-procedure Vital Signs:  Pulse/HCG Rate: (!) 55(!) 55 Temp: (!) 97.2 F (36.2 C) Resp: 14 BP: 133/81 SpO2: 99 %  EBL: None  Complications: No immediate post-treatment complications observed by team, or reported by patient.  Note: The patient tolerated the entire procedure well. A repeat set of vitals were taken after the procedure and the patient was kept under observation following institutional policy, for this type of procedure. Post-procedural neurological assessment was performed, showing return to baseline, prior to discharge. The patient was provided with post-procedure discharge instructions, including a section on how to identify potential problems. Should any problems arise concerning this procedure, the patient was given instructions to immediately contact us, at any time, without hesitation. In any case, we plan to contact the patient by telephone for a follow-up status report regarding this interventional procedure.  Comments:  No additional relevant information.  Plan of Care  Orders:  Orders Placed This Encounter  Procedures  . LUMBAR FACET(MEDIAL BRANCH NERVE BLOCK) MBNB    Scheduling Instructions:     Procedure: Lumbar facet block (AKA.: Lumbosacral medial branch nerve block)     Side: Bilateral     Level: L3-4, L4-5, & L5-S1 Facets (L2, L3, L4, L5, & S1 Medial Branch Nerves)     Sedation: Patient's choice.     Timeframe: Today    Order Specific Question:   Where will this procedure be performed?    Answer:   [facility]  . KNEE INJECTION    Local Anesthetic & Steroid injection.    Standing Status:   Future    Standing Expiration Date:   06/15/2021    Scheduling Instructions:     Side: Right-sided     Sedation: None     Timeframe: 2 weeks from now    Order Specific Question:   Where will this procedure be performed?    Answer:   [facility]   . DG PAIN CLINIC C-ARM 1-60 MIN NO REPORT    Intraoperative interpretation by procedural physician at [place].    Standing Status:   Standing    Number of Occurrences:   1    Order Specific Question:   Reason for exam:    Answer:   Assistance in needle guidance and placement for procedures requiring needle placement in or near specific anatomical locations not easily accessible without such assistance.  . DG Knee 1-2 Views Right    Standing Status:   Future    Standing Expiration Date:   04/15/2021    Scheduling Instructions:     Imaging must be done as soon as possible. Inform patient that order will expire within 30 days and I will not renew it.    Order Specific Question:   Reason for Exam (SYMPTOM  OR DIAGNOSIS REQUIRED)    Answer:   Right knee pain/arthralgia    Order Specific Question:   Preferred imaging location?    Answer:   [facility]    Order Specific Question:   Call Results- Best Contact Number?    Answer:   [phone] ([facility])    Order Specific Question:   Release to patient    Answer:   Immediate  . Informed Consent Details: Physician/Practitioner Attestation; Transcribe to consent form and obtain patient signature    Nursing Order: Transcribe to consent form and obtain patient signature. Note: Always confirm laterality of pain with Ms. Ermalinda Barrios, before procedure.    Order Specific Question:   Physician/Practitioner  attestation of informed consent for procedure/surgical case    Answer:   I, the physician/practitioner, attest that I have discussed with the patient the benefits, risks, side effects, alternatives, likelihood of achieving goals and potential problems during recovery for the procedure that I have provided informed consent.    Order Specific Question:   Procedure    Answer:   Lumbar Facet Block  under fluoroscopic guidance    Order Specific Question:   Physician/Practitioner performing the procedure    Answer:   Edder Bellanca A.  Dossie Arbour MD    Order Specific Question:   Indication/Reason    Answer:   Low Back Pain, with our without leg pain, due to Facet Joint Arthralgia (Joint Pain) Spondylosis (Arthritis of the Spine), without myelopathy or radiculopathy (Nerve Damage).  . Provide equipment / supplies at bedside    "Block Tray" (Disposable  single use) Needle type: SpinalSpinal Amount/quantity: 4 Size: Medium (5-inch) Gauge: 22G    Standing Status:   Standing    Number of Occurrences:   1    Order Specific Question:   Specify    Answer:   Block Tray   Chronic Opioid Analgesic:  Hydrocodone/APAP 5/325, 1 tab PO q 8 hrs (15 mg/day of hydrocodone) MME/day:15mg /day.   Medications ordered for procedure: Meds ordered this encounter  Medications  . lidocaine (XYLOCAINE) 2 % (with pres) injection 400 mg  . ropivacaine (PF) 2 mg/mL (0.2%) (NAROPIN) injection 18 mL  . triamcinolone acetonide (KENALOG-40) injection 80 mg   Medications administered: We administered lidocaine, ropivacaine (PF) 2 mg/mL (0.2%), and triamcinolone acetonide.  See the medical record for exact dosing, route, and time of administration.  Follow-up plan:   Return in about 2 weeks (around 03/29/2021) for Procedure (no sedation): (R) Knee inj. #1, in addition, (PPE).       Interventional Therapies  Risk  Complexity Considerations:   WNL   Planned  Pending:   Pending further evaluation   Under consideration:   Diagnosticbilateral cervical facet block  Possiblebilateral cervical facet RFA    Completed:   Therapeutic right thoracic back TPI x2 (10/17/2018)  Therapeutic left thoracic back TPI x1 (10/17/2018) Therapeutic right quadratus lumborum and erector spinae muscle TPI x1 (02/12/2018) Therapeutic right lumbar facet block x7 (12/21/2020)  Therapeutic left lumbar facet block x3 (08/09/2017)  Therapeutic right lumbar facet RFA x2 (06/04/2018) (100/90/75/75-100) Therapeutic left lumbar facet RFA x1 (02/12/2018)  (100/60/90/>75) Diagnostic right SI joint block x3 (12/21/2020)  Diagnostic left SI joint block x1 (08/09/2017)  Therapeutic right SI joint RFA x3 (06/05/2019)  Therapeutic midline L2-3 LESI x1 (10/04/2017) (100/100/85/>50) Therapeutic right L3-4 LESI x2 (11/04/2020) Therapeutic right L4-5 LESI x1 (05/20/2020) Therapeutic right L5-S1 LESI x2 (12/04/2019) Therapeutic right L5 TFESI x3 (12/04/2019)  Therapeutic right S1 TFESI x1 (11/04/2020) Diagnostic bilateral superficial trochanteric bursa injection x1 (04/30/2018)  Diagnostic right IA hip injection x1 (02/13/2019)    Palliative options:   Palliative bilateral lumbar facet RFA  Palliative bilateral lumbar facet blocks     Recent Visits Date Type Provider Dept  03/14/21 Telemedicine Milinda Pointer, MD [facility]  12/21/20 Procedure visit Milinda Pointer, MD [facility]  Showing recent visits within past 90 days and meeting all other requirements Today's Visits Date Type Provider Dept  03/15/21 Procedure visit Milinda Pointer, MD [facility]  Showing today's visits and meeting all other requirements Future Appointments Date Type Provider Dept  03/29/21 Appointment Milinda Pointer, MD [facility]  04/11/21 Appointment Milinda Pointer, MD [facility]  Showing future appointments within next 90 days and meeting all other requirements  Disposition: Discharge home  Discharge (Date  Time): 03/15/2021; 0953 hrs.   Primary Care Physician: Einar Pheasant, MD Location: [facility] Note by: Gaspar Cola, MD Date: 03/15/2021; Time: 9:56 AM  Disclaimer:  Medicine is not an Chief Strategy Officer. The only guarantee in medicine is that nothing is guaranteed. It is important to note that the decision to proceed with this intervention was based on the information collected from the patient. The Data and conclusions were drawn from the patient's  questionnaire, the interview, and the physical examination. Because the information was provided in large part by the patient, it cannot be guaranteed that it has not been purposely or unconsciously manipulated. Every effort has been made to obtain as much relevant data as possible for this evaluation. It is important to note that the conclusions that lead to this procedure are derived in large part from the available data. Always take into account that the treatment will also be dependent on availability of resources and existing treatment guidelines, considered by other Pain Management Practitioners as being common knowledge and practice, at the time of the intervention. For Medico-Legal purposes, it is also important to point out that variation in procedural techniques and pharmacological choices are the acceptable norm. The indications, contraindications, technique, and results of the above procedure should only be interpreted and judged by a Board-Certified Interventional Pain Specialist with extensive familiarity and expertise in the same exact procedure and technique.

## 2021-03-16 ENCOUNTER — Telehealth: Payer: Self-pay | Admitting: *Deleted

## 2021-03-16 ENCOUNTER — Telehealth: Payer: Self-pay

## 2021-03-16 NOTE — Telephone Encounter (Signed)
Pt states that she has burning and low stomach pain. Pt is coming in for labs at 11 on 03/17/21. Please place order for UA if possible

## 2021-03-16 NOTE — Telephone Encounter (Signed)
Spoke with patient re; procedure on yesterday.  No questions or concerns.  

## 2021-03-17 ENCOUNTER — Other Ambulatory Visit (INDEPENDENT_AMBULATORY_CARE_PROVIDER_SITE_OTHER): Payer: Medicare Other

## 2021-03-17 ENCOUNTER — Other Ambulatory Visit: Payer: Self-pay

## 2021-03-17 DIAGNOSIS — R739 Hyperglycemia, unspecified: Secondary | ICD-10-CM

## 2021-03-17 DIAGNOSIS — D649 Anemia, unspecified: Secondary | ICD-10-CM | POA: Diagnosis not present

## 2021-03-17 DIAGNOSIS — R300 Dysuria: Secondary | ICD-10-CM | POA: Diagnosis not present

## 2021-03-17 DIAGNOSIS — E7800 Pure hypercholesterolemia, unspecified: Secondary | ICD-10-CM | POA: Diagnosis not present

## 2021-03-17 DIAGNOSIS — I10 Essential (primary) hypertension: Secondary | ICD-10-CM | POA: Diagnosis not present

## 2021-03-17 DIAGNOSIS — N1831 Chronic kidney disease, stage 3a: Secondary | ICD-10-CM | POA: Diagnosis not present

## 2021-03-17 LAB — FERRITIN: Ferritin: 69.2 ng/mL (ref 10.0–291.0)

## 2021-03-17 LAB — CBC WITH DIFFERENTIAL/PLATELET
Basophils Absolute: 0 10*3/uL (ref 0.0–0.1)
Basophils Relative: 0.3 % (ref 0.0–3.0)
Eosinophils Absolute: 0 10*3/uL (ref 0.0–0.7)
Eosinophils Relative: 0 % (ref 0.0–5.0)
HCT: 34.4 % — ABNORMAL LOW (ref 36.0–46.0)
Hemoglobin: 11.6 g/dL — ABNORMAL LOW (ref 12.0–15.0)
Lymphocytes Relative: 21.5 % (ref 12.0–46.0)
Lymphs Abs: 1.4 10*3/uL (ref 0.7–4.0)
MCHC: 33.7 g/dL (ref 30.0–36.0)
MCV: 89.6 fl (ref 78.0–100.0)
Monocytes Absolute: 0.3 10*3/uL (ref 0.1–1.0)
Monocytes Relative: 5 % (ref 3.0–12.0)
Neutro Abs: 4.8 10*3/uL (ref 1.4–7.7)
Neutrophils Relative %: 73.2 % (ref 43.0–77.0)
Platelets: 213 10*3/uL (ref 150.0–400.0)
RBC: 3.84 Mil/uL — ABNORMAL LOW (ref 3.87–5.11)
RDW: 14.1 % (ref 11.5–15.5)
WBC: 6.6 10*3/uL (ref 4.0–10.5)

## 2021-03-17 LAB — HEPATIC FUNCTION PANEL
ALT: 16 U/L (ref 0–35)
AST: 24 U/L (ref 0–37)
Albumin: 4.5 g/dL (ref 3.5–5.2)
Alkaline Phosphatase: 23 U/L — ABNORMAL LOW (ref 39–117)
Bilirubin, Direct: 0.1 mg/dL (ref 0.0–0.3)
Total Bilirubin: 0.4 mg/dL (ref 0.2–1.2)
Total Protein: 7.4 g/dL (ref 6.0–8.3)

## 2021-03-17 LAB — LIPID PANEL
Cholesterol: 179 mg/dL (ref 0–200)
HDL: 44 mg/dL (ref >39.00)
LDL Cholesterol: 110 mg/dL — ABNORMAL HIGH (ref 0–99)
NonHDL: 134.92
Total CHOL/HDL Ratio: 4
Triglycerides: 127 mg/dL (ref 0.0–149.0)
VLDL: 25.4 mg/dL (ref 0.0–40.0)

## 2021-03-17 LAB — TSH: TSH: 1.16 u[IU]/mL (ref 0.35–4.50)

## 2021-03-17 LAB — HEMOGLOBIN A1C: Hgb A1c MFr Bld: 5.8 % (ref 4.6–6.5)

## 2021-03-17 LAB — BASIC METABOLIC PANEL
BUN: 17 mg/dL (ref 6–23)
CO2: 30 mEq/L (ref 19–32)
Calcium: 9.4 mg/dL (ref 8.4–10.5)
Chloride: 99 mEq/L (ref 96–112)
Creatinine, Ser: 0.91 mg/dL (ref 0.40–1.20)
GFR: 58.38 mL/min — ABNORMAL LOW (ref >60.00)
Glucose, Bld: 90 mg/dL (ref 70–99)
Potassium: 3.8 mEq/L (ref 3.5–5.1)
Sodium: 135 mEq/L (ref 135–145)

## 2021-03-17 NOTE — Telephone Encounter (Signed)
Sending this to you for documentation. Just to clarify if she is having abdominal pain.

## 2021-03-17 NOTE — Addendum Note (Signed)
Addended by: Leeanne Rio on: 03/17/2021 11:20 AM   Modules accepted: Orders

## 2021-03-17 NOTE — Telephone Encounter (Signed)
Pt is scheduled for a virtual visit tomorrow with Dr. Gayland Curry. She states that she is having some lower abdominal discomfort, urine frequency, urgency, & burning despite use of Azo & cranberry juice. Pharmacy: [facility]. NKDA. H/O UTI's

## 2021-03-17 NOTE — Telephone Encounter (Signed)
Will need to confirm what other symptoms she is having.  She may need to be seen if abdominal pain, etc.  Does anyone have openings today?

## 2021-03-17 NOTE — Telephone Encounter (Signed)
I have called and left message for her to call me back to get more info. Are you okay with collecting urine while she is here?

## 2021-03-18 ENCOUNTER — Encounter: Payer: Self-pay | Admitting: Internal Medicine

## 2021-03-18 ENCOUNTER — Telehealth (INDEPENDENT_AMBULATORY_CARE_PROVIDER_SITE_OTHER): Payer: Medicare Other | Admitting: Internal Medicine

## 2021-03-18 VITALS — Ht 64.0 in | Wt 182.0 lb

## 2021-03-18 DIAGNOSIS — R1030 Lower abdominal pain, unspecified: Secondary | ICD-10-CM

## 2021-03-18 DIAGNOSIS — B373 Candidiasis of vulva and vagina: Secondary | ICD-10-CM | POA: Diagnosis not present

## 2021-03-18 DIAGNOSIS — N3000 Acute cystitis without hematuria: Secondary | ICD-10-CM

## 2021-03-18 DIAGNOSIS — B3731 Acute candidiasis of vulva and vagina: Secondary | ICD-10-CM

## 2021-03-18 MED ORDER — FLUCONAZOLE 150 MG PO TABS: 150.0000 mg | ORAL_TABLET | Freq: Once | ORAL | 0 refills | Status: AC

## 2021-03-18 MED ORDER — AMOXICILLIN-POT CLAVULANATE 875-125 MG PO TABS: 1.0000 | ORAL_TABLET | Freq: Two times a day (BID) | ORAL | 0 refills | Status: DC

## 2021-03-18 NOTE — Progress Notes (Signed)
telephone Note  I connected with Wanda Martinez  on 03/18/21 at  1:30 PM EDT by telephone and verified that I am speaking with the correct person using two identifiers.  Location patient: home, [place] Location provider:work or home office Persons participating in the virtual visit: patient, provider  I discussed the limitations of evaluation and management by telemedicine and the availability of in person appointments. The patient expressed understanding and agreed to proceed.   HPI: C/o x 5 days of lower ab pain burning and increased frequency this started after 24 back injections with Dr. [facility] on 03/15/21 sx's now improved since drinking water tried pure cranberry juice and otc azo but she is worried over the weekend she may have UTI and wants Abx called in and medication for yeast if needed but will not take unless sx's returned  UA 2+leuks reviewed with pt   ROS: See pertinent positives and negatives per HPI.  Past Medical History:  Diagnosis Date  . Acute postoperative pain 10/25/2017  . Anemia   . Arm pain 07/26/2015  . Arthritis   . Arthritis, degenerative 03/26/2014  . Back pain 11/01/2013  . Bladder infection 06/2018  . Breast cancer ([place])    Masectomy - left - 1986   . Breast cancer [facility])    Mastectomy-right -2014  . CHEST PAIN 04/29/2010   Qualifier: Diagnosis of  By: Wynetta Emery RN, Doroteo Bradford    . Chronic cystitis   . Cystocele 02/22/2013  . Cystocele, midline 08/19/2013  . Degeneration of intervertebral disc of lumbosacral region 03/26/2014  . DYSPNEA 04/29/2010   Qualifier: Diagnosis of  By: Wynetta Emery RN, Doroteo Bradford    . Enthesopathy of hip 03/26/2014  . GERD (gastroesophageal reflux disease)   . Hiatal hernia   . HTN (hypertension)   . Hypokalemia 06/2018  . Hyponatremia 06/2018  . LBP (low back pain) 03/26/2014  . Neck pain 11/01/2013  . Parkinson disease ([place])   . Pneumonia 06/2018  . Sinusitis 02/07/2015  . Skin lesions 07/12/2014  . Urinary incontinence    mixed     Past  Surgical History:  Procedure Laterality Date  . ABDOMINAL HYSTERECTOMY    . BREAST BIOPSY  2013  . BREAST ENHANCEMENT SURGERY  1987  . BREAST IMPLANT REMOVAL    . BREAST IMPLANT REMOVAL Right 08/29/2012  . BREAST SURGERY  1986   s/p left mastectomy  . COLONOSCOPY WITH PROPOFOL N/A 09/13/2016   Procedure: COLONOSCOPY WITH PROPOFOL;  Surgeon: Manya Silvas, MD;  Location: [facility] ENDOSCOPY;  Service: Endoscopy;  Laterality: N/A;  . MASTECTOMY  08/2012   right  . TONSILLECTOMY AND ADENOIDECTOMY  79  . VESICOVAGINAL FISTULA CLOSURE W/ TAH  1983     Current Outpatient Medications:  .  amLODipine (NORVASC) 2.5 MG tablet, TAKE 1 TABLET BY MOUTH ONCE DAILY., Disp: 30 tablet, Rfl: 11 .  amoxicillin-clavulanate (AUGMENTIN) 875-125 MG tablet, Take 1 tablet by mouth 2 (two) times daily. With food, Disp: 14 tablet, Rfl: 0 .  aspirin 81 MG EC tablet, Take 81 mg by mouth daily as needed., Disp: , Rfl:  .  calcium carbonate (OSCAL) 1500 (600 Ca) MG TABS tablet, Take 1 tablet by mouth daily., Disp: , Rfl:  .  citalopram (CELEXA) 20 MG tablet, TAKE 1 & 1/2 TABLETS BY MOUTH ONCE DAILY, Disp: , Rfl:  .  cyclobenzaprine (FLEXERIL) 10 MG tablet, TAKE ONE TABLET BY MOUTH AT BEDTIME., Disp: 30 tablet, Rfl: 11 .  donepezil (ARICEPT) 5 MG tablet, , Disp: ,  Rfl:  .  fluconazole (DIFLUCAN) 150 MG tablet, Take 1 tablet (150 mg total) by mouth once for 1 dose., Disp: 1 tablet, Rfl: 0 .  gabapentin (NEURONTIN) 100 MG capsule, Take 2 capsules (200 mg total) by mouth 2 (two) times daily AND 3-4 capsules (300-400 mg total) at bedtime., Disp: 720 capsule, Rfl: 0 .  GLUCOSAMINE SULFATE PO, Take by mouth daily. , Disp: , Rfl:  .  hydrochlorothiazide (HYDRODIURIL) 25 MG tablet, TAKE 1 TABLET BY MOUTH ONCE DAILY. (PATIENT TAKES 1/2 ONCE DAILY AND EXTRA 1/2 IF NEEDED), Disp: 15 tablet, Rfl: 11 .  HYDROcodone-acetaminophen (NORCO/VICODIN) 5-325 MG tablet, Take 1 tablet by mouth every 8 (eight) hours as needed for severe pain.  Must last 30 days, Disp: 90 tablet, Rfl: 0 .  [START ON 03/23/2021] HYDROcodone-acetaminophen (NORCO/VICODIN) 5-325 MG tablet, Take 1 tablet by mouth every 8 (eight) hours as needed for severe pain. Must last 30 days, Disp: 90 tablet, Rfl: 0 .  letrozole (FEMARA) 2.5 MG tablet, Take 2.5 mg by mouth daily., Disp: , Rfl:  .  losartan (COZAAR) 50 MG tablet, TAKE (2) TABLETS BY MOUTH ONCE DAILY., Disp: 60 tablet, Rfl: 11 .  lubiprostone (AMITIZA) 24 MCG capsule, TAKE 1 CAPSULE BY MOUTH ONCE DAILY WITH BREAKFAST., Disp: , Rfl:  .  magnesium oxide (MAG-OX) 400 (241.3 Mg) MG tablet, TAKE 1 TABLET BY MOUTH ONCE DAILY., Disp: 30 tablet, Rfl: 11 .  melatonin 1 MG TABS tablet, Take 3 mg by mouth at bedtime., Disp: , Rfl:  .  meloxicam (MOBIC) 7.5 MG tablet, TAKE 1 TABLET BY MOUTH ONCE DAILY., Disp: 30 tablet, Rfl: 4 .  metoprolol succinate (TOPROL-XL) 25 MG 24 hr tablet, TAKE 1 TABLET BY MOUTH TWICE DAILY, Disp: 60 tablet, Rfl: 11 .  Multiple Vitamin (MULTIVITAMIN) tablet, Take 1 tablet by mouth daily., Disp: , Rfl:  .  omeprazole (PRILOSEC) 20 MG capsule, TAKE 1 CAPSULE BY MOUTH TWICE DAILY FOR REFLUX., Disp: 60 capsule, Rfl: 1 .  pantoprazole (PROTONIX) 40 MG tablet, TAKE 1 TABLET BY MOUTH ONCE DAILY., Disp: 30 tablet, Rfl: 11 .  pravastatin (PRAVACHOL) 20 MG tablet, TAKE 1 TABLET BY MOUTH ONCE DAILY., Disp: 30 tablet, Rfl: 11 .  Omega-3 1000 MG CAPS, Take by mouth daily.  (Patient not taking: Reported on 03/18/2021), Disp: , Rfl:   EXAM:  VITALS per patient if applicable:  GENERAL: alert, oriented, appears well and in no acute distress  PSYCH/NEURO: pleasant and cooperative, no obvious depression or anxiety, speech and thought processing grossly intact  ASSESSMENT AND PLAN:  Discussed the following assessment and plan:  Acute cystitis without hematuria - Plan: amoxicillin-clavulanate (AUGMENTIN) 875-125 MG tablet bid x 1 week hold for culture unless worsening over the weekend  Yeast vaginitis -  Plan: fluconazole (DIFLUCAN) 150 MG tablet  Lower ab pain  If returns consider imaging   -we discussed possible serious and likely etiologies, options for evaluation and workup, limitations of telemedicine visit vs in person visit, treatment, treatment risks and precautions.    I discussed the assessment and treatment plan with the patient. The patient was provided an opportunity to ask questions and all were answered. The patient agreed with the plan and demonstrated an understanding of the instructions.    Time spent 10 min Delorise Jackson, MD

## 2021-03-18 NOTE — Progress Notes (Signed)
Patient states her abdominal pain is gone. She had 12 shots on each side of her spine.  Patient states she was having burning with urination and frequency. Patient states she had very slight itching.  Patient states she drunk a lot of cranberry juice and taking AZO.   States urinary symptoms went away.

## 2021-03-20 LAB — MICROSCOPIC EXAMINATION
Bacteria, UA: NONE SEEN
Casts: NONE SEEN /lpf
RBC, Urine: NONE SEEN /hpf (ref 0–2)
WBC, UA: NONE SEEN /hpf (ref 0–5)

## 2021-03-20 LAB — URINALYSIS, ROUTINE W REFLEX MICROSCOPIC
Bilirubin, UA: NEGATIVE
Glucose, UA: NEGATIVE
Ketones, UA: NEGATIVE
Nitrite, UA: NEGATIVE
Protein,UA: NEGATIVE
RBC, UA: NEGATIVE
Specific Gravity, UA: 1.012 (ref 1.005–1.030)
Urobilinogen, Ur: 0.2 mg/dL (ref 0.2–1.0)
pH, UA: 7.5 (ref 5.0–7.5)

## 2021-03-20 LAB — URINE CULTURE

## 2021-03-21 ENCOUNTER — Telehealth: Payer: Self-pay | Admitting: Internal Medicine

## 2021-03-21 NOTE — Telephone Encounter (Signed)
Pt wants a call back about antibiotic for UTI. She states that her home phone is not working. Please call her on [phone].

## 2021-03-21 NOTE — Telephone Encounter (Signed)
Called pharmacy to clarify what is needed. Pharmacy stated they did not make this call. Nothing further needed.

## 2021-03-21 NOTE — Telephone Encounter (Signed)
pharmcay called in stated that patient stated that Dr.Scott was sending in something to go with her cholesterol medication was calling to check on this her call back number is [phone]

## 2021-03-22 NOTE — Telephone Encounter (Signed)
Called patient. VM full. Unable to leave message.

## 2021-03-22 NOTE — Telephone Encounter (Signed)
Called patient. Unable to leave message.

## 2021-03-24 NOTE — Telephone Encounter (Signed)
Called patient. Unable to leave message.

## 2021-03-29 ENCOUNTER — Ambulatory Visit: Payer: Medicare Other | Admitting: Pain Medicine

## 2021-04-05 ENCOUNTER — Ambulatory Visit: Payer: Medicare Other | Admitting: Internal Medicine

## 2021-04-11 ENCOUNTER — Encounter: Payer: Medicare Other | Admitting: Pain Medicine

## 2021-04-15 ENCOUNTER — Other Ambulatory Visit: Payer: Self-pay | Admitting: Internal Medicine

## 2021-04-18 ENCOUNTER — Encounter: Payer: Medicare Other | Admitting: Pain Medicine

## 2021-04-19 ENCOUNTER — Encounter: Payer: Self-pay | Admitting: Pain Medicine

## 2021-04-19 ENCOUNTER — Ambulatory Visit: Payer: Medicare Other | Attending: Pain Medicine | Admitting: Pain Medicine

## 2021-04-19 ENCOUNTER — Other Ambulatory Visit: Payer: Self-pay

## 2021-04-19 VITALS — BP 156/69 | HR 62 | Temp 96.6°F | Resp 14 | Ht 64.0 in | Wt 183.0 lb

## 2021-04-19 DIAGNOSIS — M25561 Pain in right knee: Secondary | ICD-10-CM | POA: Diagnosis not present

## 2021-04-19 DIAGNOSIS — G894 Chronic pain syndrome: Secondary | ICD-10-CM | POA: Insufficient documentation

## 2021-04-19 DIAGNOSIS — M1711 Unilateral primary osteoarthritis, right knee: Secondary | ICD-10-CM | POA: Diagnosis not present

## 2021-04-19 DIAGNOSIS — M47816 Spondylosis without myelopathy or radiculopathy, lumbar region: Secondary | ICD-10-CM | POA: Diagnosis not present

## 2021-04-19 DIAGNOSIS — M545 Low back pain, unspecified: Secondary | ICD-10-CM | POA: Diagnosis not present

## 2021-04-19 DIAGNOSIS — M25511 Pain in right shoulder: Secondary | ICD-10-CM | POA: Diagnosis not present

## 2021-04-19 DIAGNOSIS — G8929 Other chronic pain: Secondary | ICD-10-CM | POA: Diagnosis not present

## 2021-04-19 MED ORDER — LIDOCAINE HCL 2 % IJ SOLN
5.0000 mL | Freq: Once | INTRAMUSCULAR | Status: AC
  Administered 2021-04-19: 100 mg
  Filled 2021-04-19: qty 20

## 2021-04-19 MED ORDER — METHYLPREDNISOLONE ACETATE 80 MG/ML IJ SUSP
80.0000 mg | Freq: Once | INTRAMUSCULAR | Status: AC
  Administered 2021-04-19: 80 mg via INTRA_ARTICULAR
  Filled 2021-04-19: qty 1

## 2021-04-19 MED ORDER — HYDROCODONE-ACETAMINOPHEN 5-325 MG PO TABS: 1.0000 | ORAL_TABLET | Freq: Three times a day (TID) | ORAL | 0 refills | Status: DC | PRN

## 2021-04-19 MED ORDER — ROPIVACAINE HCL 2 MG/ML IJ SOLN
4.0000 mL | Freq: Once | INTRAMUSCULAR | Status: AC
  Administered 2021-04-19: 4 mL via INTRA_ARTICULAR
  Filled 2021-04-19: qty 10

## 2021-04-19 MED ORDER — ROPIVACAINE HCL 2 MG/ML IJ SOLN
4.0000 mL | Freq: Once | INTRAMUSCULAR | Status: AC
  Administered 2021-04-19: 4 mL

## 2021-04-19 MED ORDER — TRIAMCINOLONE ACETONIDE 40 MG/ML IJ SUSP
40.0000 mg | Freq: Once | INTRAMUSCULAR | Status: AC
  Administered 2021-04-19: 40 mg

## 2021-04-19 MED ORDER — ROPIVACAINE HCL 2 MG/ML IJ SOLN
INTRAMUSCULAR | Status: AC
  Filled 2021-04-19: qty 10

## 2021-04-19 MED ORDER — TRIAMCINOLONE ACETONIDE 40 MG/ML IJ SUSP
INTRAMUSCULAR | Status: AC
  Filled 2021-04-19: qty 1

## 2021-04-19 NOTE — Progress Notes (Signed)
PROVIDER NOTE: Information contained herein reflects review and annotations entered in association with encounter. Interpretation of such information and data should be left to medically-trained personnel. Information provided to patient can be located elsewhere in the medical record under "Patient Instructions". Document created using STT-dictation technology, any transcriptional errors that may result from process are unintentional.    Patient: Wanda Martinez  Service Category: Procedure  Provider: Gaspar Cola, MD  DOB: 01-Dec-1935  DOS: 04/19/2021  Location: [facility]  MRN: [id number]  Setting: Ambulatory - outpatient  Referring Provider: Einar Pheasant, MD  Type: Established Patient  Specialty: Interventional Pain Management  PCP: Einar Pheasant, MD   Primary Reason for Visit: Interventional Pain Management Treatment. CC: Knee Pain (right) and Shoulder Pain (right)  Procedure #1:  Anesthesia, Analgesia, Anxiolysis:  Type: Diagnostic Intra-Articular Local anesthetic and steroid Knee Injection #1  Region: Lateral infrapatellar Knee Region Level: Knee Joint Laterality: Right knee  Type: Local Anesthesia Indication(s): Analgesia         Local Anesthetic: Lidocaine 1-2% Route: Infiltration ([place]/IM) IV Access: Declined Sedation: Declined   Position: Sitting   Indications: 1. Chronic knee pain (Right)   2. Osteoarthritis of knee (Right)   3. Lumbar facet syndrome (Bilateral) (R>L)   4. Chronic low back pain (1ry area of Pain) (Bilateral) (R>L) w/o sciatica    Procedure #2:    Type: Trapezius muscle Trigger Point Injection (1-2 muscle groups) #1  CPT: 20552 Primary Purpose: Therapeutic Region: Lateral Cervicothoracic Level: Shoulder blade (suprascapular) Target Area: Right lateral trapezius muscle Trigger Point Approach: Percutaneous, ipsilateral approach. Laterality: Right-Sided           Indications: 1. Trigger point of shoulder region (Right)   2.  Chronic shoulder pain (Right)     Pain Score: Pre-procedure: 10-Worst pain ever/10 Post-procedure: 0-No pain/10   Post-Procedure Evaluation  Procedure (03/15/2021): Palliative bilateral lumbar facet block #[id number] under fluoroscopic guidance, no sedation Pre-procedure pain level: 9/10 Post-procedure: 0/10 (100% relief)  Sedation: None.  Effectiveness during initial hour after procedure(Ultra-Short Term Relief): 100 %.  Local anesthetic used: Long-acting (4-6 hours) Effectiveness: Defined as any analgesic benefit obtained secondary to the administration of local anesthetics. This carries significant diagnostic value as to the etiological location, or anatomical origin, of the pain. Duration of benefit is expected to coincide with the duration of the local anesthetic used.  Effectiveness during initial 4-6 hours after procedure(Short-Term Relief): 100 %.  Long-term benefit: Defined as any relief past the pharmacologic duration of the local anesthetics.  Effectiveness past the initial 6 hours after procedure(Long-Term Relief): 50 %.  Current benefits: Defined as benefit that persist at this time.   Analgesia:  The patient indicates having an ongoing 50% relief of her low back pain. Function: Ms. Kehm reports improvement in function ROM: Ms. Rae reports improvement in ROM  Pre-op H&P Assessment:  Ms. Edick is a 86 y.o. (year old), female patient, seen today for interventional treatment. She  has a past surgical history that includes Tonsillectomy and adenoidectomy (81); Vesicovaginal fistula closure w/ TAH (1983); Breast surgery (1986); Breast enhancement surgery (1987); Breast implant removal; Breast implant removal (Right, 08/29/2012); Mastectomy (08/2012); Abdominal hysterectomy; Colonoscopy with propofol (N/A, 09/13/2016); and Breast biopsy (2013). Ms. Parslow has a current medication list which includes the following prescription(s): amlodipine, aspirin, calcium carbonate, citalopram,  cyclobenzaprine, donepezil, glucosamine sulfate, hydrochlorothiazide, letrozole, losartan, lubiprostone, magnesium oxide, melatonin, meloxicam, metoprolol succinate, multivitamin, omeprazole, pantoprazole, pravastatin, gabapentin, and [START ON 04/22/2021] hydrocodone-acetaminophen. Her primarily concern today is the  Knee Pain (right) and Shoulder Pain (right)  Initial Vital Signs:  Pulse/HCG Rate: 62  Temp: (!) 96.6 F (35.9 C) Resp: 14 BP: (!) 156/69 SpO2: 100 %  BMI: Estimated body mass index is 31.41 kg/m as calculated from the following:   Height as of this encounter: 5\' 4"  (1.626 m).   Weight as of this encounter: 183 lb (83 kg).  Risk Assessment: Allergies: Reviewed. She is allergic to sulfa antibiotics and vesicare [solifenacin].  Allergy Precautions: None required Coagulopathies: Reviewed. None identified.  Blood-thinner therapy: None at this time Active Infection(s): Reviewed. None identified. Ms. Mapp is afebrile  Site Confirmation: Ms. Walls was asked to confirm the procedure and laterality before marking the site Procedure checklist: Completed Consent: Before the procedure and under the influence of no sedative(s), amnesic(s), or anxiolytics, the patient was informed of the treatment options, risks and possible complications. To fulfill our ethical and legal obligations, as recommended by the American Medical Association's Code of Ethics, I have informed the patient of my clinical impression; the nature and purpose of the treatment or procedure; the risks, benefits, and possible complications of the intervention; the alternatives, including doing nothing; the risk(s) and benefit(s) of the alternative treatment(s) or procedure(s); and the risk(s) and benefit(s) of doing nothing. The patient was provided information about the general risks and possible complications associated with the procedure. These may include, but are not limited to: failure to achieve desired goals,  infection, bleeding, organ or nerve damage, allergic reactions, paralysis, and death. In addition, the patient was informed of those risks and complications associated to the procedure, such as failure to decrease pain; infection; bleeding; organ or nerve damage with subsequent damage to sensory, motor, and/or autonomic systems, resulting in permanent pain, numbness, and/or weakness of one or several areas of the body; allergic reactions; (i.e.: anaphylactic reaction); and/or death. Furthermore, the patient was informed of those risks and complications associated with the medications. These include, but are not limited to: allergic reactions (i.e.: anaphylactic or anaphylactoid reaction(s)); adrenal axis suppression; blood sugar elevation that in diabetics may result in ketoacidosis or comma; water retention that in patients with history of congestive heart failure may result in shortness of breath, pulmonary edema, and decompensation with resultant heart failure; weight gain; swelling or edema; medication-induced neural toxicity; particulate matter embolism and blood vessel occlusion with resultant organ, and/or nervous system infarction; and/or aseptic necrosis of one or more joints. Finally, the patient was informed that Medicine is not an exact science; therefore, there is also the possibility of unforeseen or unpredictable risks and/or possible complications that may result in a catastrophic outcome. The patient indicated having understood very clearly. We have given the patient no guarantees and we have made no promises. Enough time was given to the patient to ask questions, all of which were answered to the patient's satisfaction. Ms. Karim has indicated that she wanted to continue with the procedure. Attestation: I, the ordering provider, attest that I have discussed with the patient the benefits, risks, side-effects, alternatives, likelihood of achieving goals, and potential problems during recovery  for the procedure that I have provided informed consent. Date  Time: 04/19/2021  9:18 AM  Pre-Procedure Preparation:  Monitoring: As per clinic protocol. Respiration, ETCO2, SpO2, BP, heart rate and rhythm monitor placed and checked for adequate function Safety Precautions: Patient was assessed for positional comfort and pressure points before starting the procedure. Time-out: I initiated and conducted the "Time-out" before starting the procedure, as per protocol. The patient was asked  to participate by confirming the accuracy of the "Time Out" information. Verification of the correct person, site, and procedure were performed and confirmed by me, the nursing staff, and the patient. "Time-out" conducted as per Joint Commission's Universal Protocol (UP.01.01.01). Time: 0949  Description of Procedure #1:  Target Area: Knee Joint Approach: Just above the Lateral tibial plateau, lateral to the infrapatellar tendon. Area Prepped: Entire knee area, from the mid-thigh to the mid-shin. DuraPrep (Iodine Povacrylex [0.7% available iodine] and Isopropyl Alcohol, 74% w/w) Safety Precautions: Aspiration looking for blood return was conducted prior to all injections. At no point did we inject any substances, as a needle was being advanced. No attempts were made at seeking any paresthesias. Safe injection practices and needle disposal techniques used. Medications properly checked for expiration dates. SDV (single dose vial) medications used. Description of the Procedure: Protocol guidelines were followed. The patient was placed in position over the fluoroscopy table. The target area was identified and the area prepped in the usual manner. Skin & deeper tissues infiltrated with local anesthetic. Appropriate amount of time allowed to pass for local anesthetics to take effect. The procedure needles were then advanced to the target area. Proper needle placement secured. Negative aspiration confirmed. Solution injected in  intermittent fashion, asking for systemic symptoms every 0.5cc of injectate. The needles were then removed and the area cleansed, making sure to leave some of the prepping solution back to take advantage of its long term bactericidal properties. Vitals:   04/19/21 0918  BP: (!) 156/69  Pulse: 62  Resp: 14  Temp: (!) 96.6 F (35.9 C)  TempSrc: Temporal  SpO2: 100%  Weight: 183 lb (83 kg)  Height: 5\' 4"  (1.626 m)    Start Time: 0949 hrs.  Materials:  Needle(s) Type: Regular needle Gauge: 25G Length: 1.5-in Medication(s): Please see orders for medications and dosing details.  Imaging Guidance:          Type of Imaging Technique: None used Indication(s): N/A Exposure Time: No patient exposure Contrast: None used. Fluoroscopic Guidance: N/A Ultrasound Guidance: N/A Interpretation: N/A  Description of Procedure #2:  Area Prepped: Entire Posterior Cervicothoracic Region DuraPrep (Iodine Povacrylex [0.7% available iodine] and Isopropyl Alcohol, 74% w/w) Safety Precautions: Aspiration looking for blood return was conducted prior to all injections. At no point did we inject any substances, as a needle was being advanced. No attempts were made at seeking any paresthesias. Safe injection practices and needle disposal techniques used. Medications properly checked for expiration dates. SDV (single dose vial) medications used. Description of the Procedure: Protocol guidelines were followed. The patient was placed in position over the fluoroscopy table. The target area was identified and the area prepped in the usual manner. Skin & deeper tissues infiltrated with local anesthetic. Appropriate amount of time allowed to pass for local anesthetics to take effect. The procedure needles were then advanced to the target area. Proper needle placement secured. Negative aspiration confirmed. Solution injected in intermittent fashion, asking for systemic symptoms every 0.5cc of injectate. The needles were  then removed and the area cleansed, making sure to leave some of the prepping solution back to take advantage of its long term bactericidal properties.  Vitals:   04/19/21 0918  BP: (!) 156/69  Pulse: 62  Resp: 14  Temp: (!) 96.6 F (35.9 C)  TempSrc: Temporal  SpO2: 100%  Weight: 183 lb (83 kg)  Height: 5\' 4"  (1.626 m)   End Time: 0955 hrs. Materials:  Needle(s) Type: Regular needle Gauge: 25G Length:  1.5-in Medication(s): Please see orders for medications and dosing details.  Imaging Guidance:          Type of Imaging Technique: None used Indication(s): N/A Exposure Time: No patient exposure Contrast: None used. Fluoroscopic Guidance: N/A Ultrasound Guidance: N/A Interpretation: N/A  Antibiotic Prophylaxis:   Anti-infectives (From admission, onward)   None     Indication(s): None identified  Post-operative Assessment:  Post-procedure Vital Signs:  Pulse/HCG Rate: 62  Temp: (!) 96.6 F (35.9 C) Resp: 14 BP: (!) 156/69 SpO2: 100 %  EBL: None  Complications: No immediate post-treatment complications observed by team, or reported by patient.  Note: The patient tolerated the entire procedure well. A repeat set of vitals were taken after the procedure and the patient was kept under observation following institutional policy, for this type of procedure. Post-procedural neurological assessment was performed, showing return to baseline, prior to discharge. The patient was provided with post-procedure discharge instructions, including a section on how to identify potential problems. Should any problems arise concerning this procedure, the patient was given instructions to immediately contact us, at any time, without hesitation. In any case, we plan to contact the patient by telephone for a follow-up status report regarding this interventional procedure.  Comments:  No additional relevant information.  Plan of Care  Orders:  Orders Placed This Encounter  Procedures   . KNEE INJECTION    Local Anesthetic & Steroid injection.    Scheduling Instructions:     Side(s): Right Knee     Sedation: None     Timeframe: Today    Order Specific Question:   Where will this procedure be performed?    Answer:   [facility]  . TRIGGER POINT INJECTION    Scheduling Instructions:     Area: Right shoulder region     Side: Right     Sedation: No Sedation.     Timeframe: Today    Order Specific Question:   Where will this procedure be performed?    Answer:   [facility]  . Informed Consent Details: Physician/Practitioner Attestation; Transcribe to consent form and obtain patient signature    Note: Always confirm laterality of pain with Ms. Ermalinda Barrios, before procedure. Transcribe to consent form and obtain patient signature.    Order Specific Question:   Physician/Practitioner attestation of informed consent for procedure/surgical case    Answer:   I, the physician/practitioner, attest that I have discussed with the patient the benefits, risks, side effects, alternatives, likelihood of achieving goals and potential problems during recovery for the procedure that I have provided informed consent.    Order Specific Question:   Procedure    Answer:   Right-sided intra-articular knee arthrocentesis (aspiration and/or injection)    Order Specific Question:   Physician/Practitioner performing the procedure    Answer:   Martin Belling A. Dossie Arbour, MD    Order Specific Question:   Indication/Reason    Answer:   Chronic right-sided knee pain secondary to knee arthropathy/arthralgia  . Provide equipment / supplies at bedside    "Block Tray" (Disposable  single use) Needle type: SpinalRegular Amount/quantity: 1 Size: Short(1.5-inch) Gauge: 25G    Standing Status:   Standing    Number of Occurrences:   1    Order Specific Question:   Specify    Answer:   Block Tray  . Informed Consent Details: Physician/Practitioner Attestation; Transcribe to consent form and  obtain patient signature    Provider Attestation: I, Lima Dossie Arbour, MD, (Pain Management  Specialist), the physician/practitioner, attest that I have discussed with the patient the benefits, risks, side effects, alternatives, likelihood of achieving goals and potential problems during recovery for the procedure that I have provided informed consent.    Scheduling Instructions:     Note: Always confirm laterality of pain with Ms. Ermalinda Barrios, before procedure.     Transcribe to consent form and obtain patient signature.    Order Specific Question:   Physician/Practitioner attestation of informed consent for procedure/surgical case    Answer:   I, the physician/practitioner, attest that I have discussed with the patient the benefits, risks, side effects, alternatives, likelihood of achieving goals and potential problems during recovery for the procedure that I have provided informed consent.    Order Specific Question:   Procedure    Answer:   Myoneural Block (Trigger Point injection)    Order Specific Question:   Physician/Practitioner performing the procedure    Answer:   Tamura Lasky A. Dossie Arbour MD    Order Specific Question:   Indication/Reason    Answer:   Musculoskeletal pain/myofascial pain secondary to trigger point   Chronic Opioid Analgesic:  Hydrocodone/APAP 5/325, 1 tab PO q 8 hrs (15 mg/day of hydrocodone) MME/day:15mg /day.   Medications ordered for procedure: Meds ordered this encounter  Medications  . ropivacaine (PF) 2 mg/mL (0.2%) (NAROPIN) injection 4 mL  . methylPREDNISolone acetate (DEPO-MEDROL) injection 80 mg  . lidocaine (XYLOCAINE) 2 % (with pres) injection 100 mg  . ropivacaine (PF) 2 mg/mL (0.2%) (NAROPIN) injection 4 mL  . triamcinolone acetonide (KENALOG-40) injection 40 mg  . HYDROcodone-acetaminophen (NORCO/VICODIN) 5-325 MG tablet    Sig: Take 1 tablet by mouth every 8 (eight) hours as needed for severe pain. Must last 30 days    Dispense:  90 tablet    Refill:   0    Chronic Pain: STOP Act (Not applicable) Fill 1 day early if closed on refill date. Avoid benzodiazepines within 8 hours of opioids   Medications administered: We administered ropivacaine (PF) 2 mg/mL (0.2%), methylPREDNISolone acetate, lidocaine, ropivacaine (PF) 2 mg/mL (0.2%), and triamcinolone acetonide.  See the medical record for exact dosing, route, and time of administration.  Follow-up plan:   Return in about 2 weeks (around 05/03/2021) for (F2F), (PPE).       Interventional Therapies  Risk  Complexity Considerations:   Estimated body mass index is 31.41 kg/m as calculated from the following:   Height as of this encounter: 5\' 4"  (1.626 m).   Weight as of this encounter: 183 lb (83 kg). WNL   Planned  Pending:   Pending further evaluation   Under consideration:   Diagnosticbilateral cervical facet block  Possiblebilateral cervical facet RFA    Completed:   Therapeutic right superior/lateral trapezius muscle TPI x1 (04/19/2021)  Therapeutic right thoracic back TPI x2 (10/17/2018)  Therapeutic left thoracic back TPI x1 (10/17/2018) Therapeutic right quadratus lumborum and erector spinae muscle TPI x1 (02/12/2018) Therapeutic right lumbar facet block x8 (03/15/2021)  Therapeutic left lumbar facet block x4 (03/15/2021)  Therapeutic right lumbar facet RFA x2 (06/04/2018) (100/90/75/75-100) Therapeutic left lumbar facet RFA x1 (02/12/2018) (100/60/90/>75) Diagnostic right SI joint block x3 (12/21/2020)  Diagnostic left SI joint block x1 (08/09/2017)  Therapeutic right SI joint RFA x3 (06/05/2019)  Therapeutic midline L2-3 LESI x1 (10/04/2017) (100/100/85/>50) Therapeutic right L3-4 LESI x2 (11/04/2020) Therapeutic right L4-5 LESI x1 (05/20/2020) Therapeutic right L5-S1 LESI x2 (12/04/2019) Therapeutic right L5 TFESI x3 (12/04/2019)  Therapeutic right S1 TFESI x1 (11/04/2020) Diagnostic bilateral superficial trochanteric  bursa injection x1 (04/30/2018)  Diagnostic right IA  hip injection x1 (02/13/2019)  Diagnostic/therapeutic right IA knee injection (steroid) x1 (04/19/2021)    Palliative options:   Palliative bilateral lumbar facet RFA  Palliative bilateral lumbar facet blocks      Recent Visits Date Type Provider Dept  03/15/21 Procedure visit Milinda Pointer, MD [facility]  03/14/21 Telemedicine Milinda Pointer, MD [facility]  Showing recent visits within past 90 days and meeting all other requirements Today's Visits Date Type Provider Dept  04/19/21 Procedure visit Milinda Pointer, MD [facility]  Showing today's visits and meeting all other requirements Future Appointments Date Type Provider Dept  05/18/21 Appointment Milinda Pointer, MD [facility]  Showing future appointments within next 90 days and meeting all other requirements  Disposition: Discharge home  Discharge (Date  Time): 04/19/2021;   hrs.   Primary Care Physician: Einar Pheasant, MD Location: [facility] Note by: Gaspar Cola, MD Date: 04/19/2021; Time: 3:29 PM  Disclaimer:  Medicine is not an Chief Strategy Officer. The only guarantee in medicine is that nothing is guaranteed. It is important to note that the decision to proceed with this intervention was based on the information collected from the patient. The Data and conclusions were drawn from the patient's questionnaire, the interview, and the physical examination. Because the information was provided in large part by the patient, it cannot be guaranteed that it has not been purposely or unconsciously manipulated. Every effort has been made to obtain as much relevant data as possible for this evaluation. It is important to note that the conclusions that lead to this procedure are derived in large part from the available data. Always take into account that the treatment will also be dependent on availability of resources and existing treatment  guidelines, considered by other Pain Management Practitioners as being common knowledge and practice, at the time of the intervention. For Medico-Legal purposes, it is also important to point out that variation in procedural techniques and pharmacological choices are the acceptable norm. The indications, contraindications, technique, and results of the above procedure should only be interpreted and judged by a Board-Certified Interventional Pain Specialist with extensive familiarity and expertise in the same exact procedure and technique.

## 2021-04-19 NOTE — Patient Instructions (Signed)

## 2021-04-19 NOTE — Progress Notes (Signed)
Safety precautions to be maintained throughout the outpatient stay will include: orient to surroundings, keep bed in low position, maintain call bell within reach at all times, provide assistance with transfer out of bed and ambulation.  

## 2021-04-20 ENCOUNTER — Telehealth: Payer: Self-pay

## 2021-04-20 NOTE — Telephone Encounter (Signed)
Post procedure phone call.  Call could not be completed.

## 2021-05-02 ENCOUNTER — Ambulatory Visit (INDEPENDENT_AMBULATORY_CARE_PROVIDER_SITE_OTHER): Payer: Medicare Other | Admitting: Internal Medicine

## 2021-05-02 ENCOUNTER — Telehealth: Payer: Self-pay | Admitting: Internal Medicine

## 2021-05-02 ENCOUNTER — Other Ambulatory Visit: Payer: Self-pay

## 2021-05-02 VITALS — BP 130/70 | HR 60 | Temp 97.2°F | Resp 16 | Ht 64.0 in | Wt 181.0 lb

## 2021-05-02 DIAGNOSIS — L814 Other melanin hyperpigmentation: Secondary | ICD-10-CM | POA: Diagnosis not present

## 2021-05-02 DIAGNOSIS — L578 Other skin changes due to chronic exposure to nonionizing radiation: Secondary | ICD-10-CM | POA: Diagnosis not present

## 2021-05-02 DIAGNOSIS — Z0000 Encounter for general adult medical examination without abnormal findings: Secondary | ICD-10-CM

## 2021-05-02 DIAGNOSIS — K219 Gastro-esophageal reflux disease without esophagitis: Secondary | ICD-10-CM | POA: Diagnosis not present

## 2021-05-02 DIAGNOSIS — N1831 Chronic kidney disease, stage 3a: Secondary | ICD-10-CM

## 2021-05-02 DIAGNOSIS — W19XXXA Unspecified fall, initial encounter: Secondary | ICD-10-CM

## 2021-05-02 DIAGNOSIS — I10 Essential (primary) hypertension: Secondary | ICD-10-CM | POA: Diagnosis not present

## 2021-05-02 DIAGNOSIS — L821 Other seborrheic keratosis: Secondary | ICD-10-CM | POA: Diagnosis not present

## 2021-05-02 DIAGNOSIS — M810 Age-related osteoporosis without current pathological fracture: Secondary | ICD-10-CM | POA: Diagnosis not present

## 2021-05-02 DIAGNOSIS — F32A Depression, unspecified: Secondary | ICD-10-CM

## 2021-05-02 DIAGNOSIS — E7800 Pure hypercholesterolemia, unspecified: Secondary | ICD-10-CM

## 2021-05-02 DIAGNOSIS — D649 Anemia, unspecified: Secondary | ICD-10-CM

## 2021-05-02 DIAGNOSIS — G20 Parkinson's disease: Secondary | ICD-10-CM | POA: Diagnosis not present

## 2021-05-02 DIAGNOSIS — I700 Atherosclerosis of aorta: Secondary | ICD-10-CM | POA: Diagnosis not present

## 2021-05-02 DIAGNOSIS — C50211 Malignant neoplasm of upper-inner quadrant of right female breast: Secondary | ICD-10-CM | POA: Diagnosis not present

## 2021-05-02 DIAGNOSIS — G20A1 Parkinson's disease without dyskinesia, without mention of fluctuations: Secondary | ICD-10-CM

## 2021-05-02 DIAGNOSIS — R739 Hyperglycemia, unspecified: Secondary | ICD-10-CM

## 2021-05-02 DIAGNOSIS — F320 Major depressive disorder, single episode, mild: Secondary | ICD-10-CM

## 2021-05-02 MED ORDER — PRAVASTATIN SODIUM 40 MG PO TABS: 40.0000 mg | ORAL_TABLET | Freq: Every day | ORAL | 1 refills | Status: DC

## 2021-05-02 NOTE — Progress Notes (Signed)
Patient ID: Wanda Martinez, female   DOB: Aug 27, 1935, 86 y.o.   MRN: [id number]   Subjective:    Patient ID: Wanda Martinez, female    DOB: 09-15-1935, 86 y.o.   MRN: [id number]  HPI This visit occurred during the SARS-CoV-2 public health emergency.  Safety protocols were in place, including screening questions prior to the visit, additional usage of staff PPE, and extensive cleaning of exam room while observing appropriate contact time as indicated for disinfecting solutions.  Patient here for her physical exam.  She reports a fall one week ago. Her toe got caught.  [place] forward on her knees.  Left knee - bruised.  Pain is better now.  Bruising resolving.  No head injury.  Using her cane.  Discussed life line.  Information given.  Slow position changes and movements discussed.  Tries to stay as active as possible.  No chest pain or sob reported.  No abdominal pain.  Bowels moving.  No vaginal problems.  Several weeks ago, had symptoms c/w UTI.  Treated.  No urinary symptoms now.  Declines f/u mammogram.  Received prolia 01/2021.  Doing well on celexa and trazodone.  Handling stress.  Discussed previous labs.  Pravastatin never increased on rx.  Discussed increase to $RemoveBef'40mg'RSQshdGYbv$ .  She is agreeable.  S/p injectoin right shoulder - better.     Past Medical History:  Diagnosis Date  . Acute postoperative pain 10/25/2017  . Anemia   . Arm pain 07/26/2015  . Arthritis   . Arthritis, degenerative 03/26/2014  . Back pain 11/01/2013  . Bladder infection 06/2018  . Breast cancer ([place])    Masectomy - left - 1986   . Breast cancer [facility])    Mastectomy-right -2014  . CHEST PAIN 04/29/2010   Qualifier: Diagnosis of  By: Wynetta Emery RN, Doroteo Bradford    . Chronic cystitis   . Cystocele 02/22/2013  . Cystocele, midline 08/19/2013  . Degeneration of intervertebral disc of lumbosacral region 03/26/2014  . DYSPNEA 04/29/2010   Qualifier: Diagnosis of  By: Wynetta Emery RN, Doroteo Bradford    . Enthesopathy of hip 03/26/2014  . GERD  (gastroesophageal reflux disease)   . Hiatal hernia   . HTN (hypertension)   . Hypokalemia 06/2018  . Hyponatremia 06/2018  . LBP (low back pain) 03/26/2014  . Neck pain 11/01/2013  . Parkinson disease ([place])   . Pneumonia 06/2018  . Sinusitis 02/07/2015  . Skin lesions 07/12/2014  . Urinary incontinence    mixed    Past Surgical History:  Procedure Laterality Date  . ABDOMINAL HYSTERECTOMY    . BREAST BIOPSY  2013  . BREAST ENHANCEMENT SURGERY  1987  . BREAST IMPLANT REMOVAL    . BREAST IMPLANT REMOVAL Right 08/29/2012  . BREAST SURGERY  1986   s/p left mastectomy  . COLONOSCOPY WITH PROPOFOL N/A 09/13/2016   Procedure: COLONOSCOPY WITH PROPOFOL;  Surgeon: Manya Silvas, MD;  Location: [facility] ENDOSCOPY;  Service: Endoscopy;  Laterality: N/A;  . MASTECTOMY  08/2012   right  . TONSILLECTOMY AND ADENOIDECTOMY  79  . VESICOVAGINAL FISTULA CLOSURE W/ TAH  1983   Family History  Problem Relation Age of Onset  . Diabetes Father   . Stroke Father   . Colon polyps Father   . Stroke Mother   . Parkinson's disease Mother    Social History   Socioeconomic History  . Marital status: Married    Spouse name: Not on file  . Number of children: 3  . Years of  education: Not on file  . Highest education level: Not on file  Occupational History  . Not on file  Tobacco Use  . Smoking status: Never Smoker  . Smokeless tobacco: Never Used  . Tobacco comment: tobacco use - no  Vaping Use  . Vaping Use: Never used  Substance and Sexual Activity  . Alcohol use: No    Alcohol/week: 0.0 standard drinks  . Drug use: No  . Sexual activity: Never  Other Topics Concern  . Not on file  Social History Narrative  . Not on file   Social Determinants of Health   Financial Resource Strain: Not on file  Food Insecurity: Not on file  Transportation Needs: No Transportation Needs  . Lack of Transportation (Medical): No  . Lack of Transportation (Non-Medical): No  Physical Activity: Not on  file  Stress: No Stress Concern Present  . Feeling of Stress : Not at all  Social Connections: Socially Integrated  . Frequency of Communication with Friends and Family: Three times a week  . Frequency of Social Gatherings with Friends and Family: More than three times a week  . Attends Religious Services: More than 4 times per year  . Active Member of Clubs or Organizations: Yes  . Attends Archivist Meetings: 1 to 4 times per year  . Marital Status: Married    Outpatient Encounter Medications as of 05/02/2021  Medication Sig  . pravastatin (PRAVACHOL) 40 MG tablet Take 1 tablet (40 mg total) by mouth daily.  Marland Kitchen amLODipine (NORVASC) 2.5 MG tablet TAKE 1 TABLET BY MOUTH ONCE DAILY.  Marland Kitchen aspirin 81 MG EC tablet Take 81 mg by mouth daily as needed.  . calcium carbonate (OSCAL) 1500 (600 Ca) MG TABS tablet Take 1 tablet by mouth daily.  . citalopram (CELEXA) 20 MG tablet TAKE 1 & 1/2 TABLETS BY MOUTH ONCE DAILY  . cyclobenzaprine (FLEXERIL) 10 MG tablet TAKE ONE TABLET BY MOUTH AT BEDTIME.  Marland Kitchen donepezil (ARICEPT) 5 MG tablet   . gabapentin (NEURONTIN) 100 MG capsule Take 2 capsules (200 mg total) by mouth 2 (two) times daily AND 3-4 capsules (300-400 mg total) at bedtime.  Marland Kitchen GLUCOSAMINE SULFATE PO Take by mouth daily.   . hydrochlorothiazide (HYDRODIURIL) 25 MG tablet TAKE 1 TABLET BY MOUTH ONCE DAILY. (PATIENT TAKES 1/2 ONCE DAILY AND EXTRA 1/2 IF NEEDED)  . HYDROcodone-acetaminophen (NORCO/VICODIN) 5-325 MG tablet Take 1 tablet by mouth every 8 (eight) hours as needed for severe pain. Must last 30 days  . letrozole (FEMARA) 2.5 MG tablet Take 2.5 mg by mouth daily.  Marland Kitchen losartan (COZAAR) 50 MG tablet TAKE (2) TABLETS BY MOUTH ONCE DAILY.  Marland Kitchen lubiprostone (AMITIZA) 24 MCG capsule TAKE 1 CAPSULE BY MOUTH ONCE DAILY WITH BREAKFAST.  . magnesium oxide (MAG-OX) 400 (241.3 Mg) MG tablet TAKE 1 TABLET BY MOUTH ONCE DAILY.  . melatonin 1 MG TABS tablet Take 3 mg by mouth at bedtime.  .  meloxicam (MOBIC) 7.5 MG tablet TAKE 1 TABLET BY MOUTH ONCE DAILY.  . metoprolol succinate (TOPROL-XL) 25 MG 24 hr tablet TAKE 1 TABLET BY MOUTH TWICE DAILY  . Multiple Vitamin (MULTIVITAMIN) tablet Take 1 tablet by mouth daily.  Marland Kitchen omeprazole (PRILOSEC) 20 MG capsule TAKE 1 CAPSULE BY MOUTH TWICE DAILY FOR REFLUX.  . pantoprazole (PROTONIX) 40 MG tablet TAKE 1 TABLET BY MOUTH ONCE DAILY.  . [DISCONTINUED] pravastatin (PRAVACHOL) 20 MG tablet TAKE 1 TABLET BY MOUTH ONCE DAILY.   No facility-administered encounter medications on file  as of 05/02/2021.    Review of Systems  Constitutional: Negative for appetite change and unexpected weight change.  HENT: Negative for congestion, sinus pressure and sore throat.   Eyes: Negative for pain and visual disturbance.  Respiratory: Negative for cough, chest tightness and shortness of breath.   Cardiovascular: Negative for chest pain, palpitations and leg swelling.  Gastrointestinal: Negative for abdominal pain, diarrhea, nausea and vomiting.  Genitourinary: Negative for difficulty urinating and dysuria.  Musculoskeletal: Negative for joint swelling and myalgias.  Skin: Negative for color change and rash.  Neurological: Negative for dizziness, light-headedness and headaches.  Hematological: Negative for adenopathy. Does not bruise/bleed easily.  Psychiatric/Behavioral: Negative for decreased concentration and dysphoric mood.       Objective:    Physical Exam Vitals reviewed.  Constitutional:      General: She is not in acute distress.    Appearance: Normal appearance.  HENT:     Head: Normocephalic and atraumatic.     Right Ear: External ear normal.     Left Ear: External ear normal.  Eyes:     General: No scleral icterus.       Right eye: No discharge.        Left eye: No discharge.     Conjunctiva/sclera: Conjunctivae normal.  Neck:     Thyroid: No thyromegaly.  Cardiovascular:     Rate and Rhythm: Normal rate and regular rhythm.   Pulmonary:     Effort: No respiratory distress.     Breath sounds: Normal breath sounds. No wheezing.     Comments: Breasts :  S/p right breast mastectomy.  No axillary adenopathy appreciated.  Left breast - no nodules palpated.  No axillary adenopathy.   Abdominal:     General: Bowel sounds are normal.     Palpations: Abdomen is soft.     Tenderness: There is no abdominal tenderness.  Musculoskeletal:        General: No swelling or tenderness.     Cervical back: Neck supple. No tenderness.  Lymphadenopathy:     Cervical: No cervical adenopathy.  Skin:    Findings: No erythema or rash.  Neurological:     Mental Status: She is alert.  Psychiatric:        Mood and Affect: Mood normal.        Behavior: Behavior normal.     BP 130/70   Pulse 60   Temp (!) 97.2 F (36.2 C) (Temporal)   Resp 16   Ht $R'5\' 4"'zw$  (1.626 m)   Wt 181 lb (82.1 kg)   LMP 12/18/1981   SpO2 98%   BMI 31.07 kg/m  Wt Readings from Last 3 Encounters:  05/02/21 181 lb (82.1 kg)  04/19/21 183 lb (83 kg)  03/18/21 182 lb (82.6 kg)     Lab Results  Component Value Date   WBC 6.6 03/17/2021   HGB 11.6 (L) 03/17/2021   HCT 34.4 (L) 03/17/2021   PLT 213.0 03/17/2021   GLUCOSE 90 03/17/2021   CHOL 179 03/17/2021   TRIG 127.0 03/17/2021   HDL 44.00 03/17/2021   LDLDIRECT 73.0 07/09/2018   LDLCALC 110 (H) 03/17/2021   ALT 16 03/17/2021   AST 24 03/17/2021   NA 135 03/17/2021   K 3.8 03/17/2021   CL 99 03/17/2021   CREATININE 0.91 03/17/2021   BUN 17 03/17/2021   CO2 30 03/17/2021   TSH 1.16 03/17/2021   HGBA1C 5.8 03/17/2021    DG Knee 1-2 Views Right  Result  Date: 03/15/2021 CLINICAL DATA:  Fall, right knee pain EXAM: RIGHT KNEE - 1-2 VIEW COMPARISON:  None. FINDINGS: Two view radiograph right knee demonstrates normal alignment. No fracture or dislocation. Minimal osteophyte formation is present in keeping with at least mild tricompartmental degenerative arthritis. No effusion. Soft tissues are  unremarkable. IMPRESSION: Mild degenerative change.  No acute fracture or dislocation. Electronically Signed   By: Fidela Salisbury MD   On: 03/15/2021 23:05   [place] C-ARM 1-60 MIN NO REPORT  Result Date: 03/15/2021 Fluoro was used, but no Radiologist interpretation will be provided. Please refer to "NOTES" tab for provider progress note.      Assessment & Plan:   Problem List Items Addressed This Visit    Anemia    Follow cbc.  Followed by hematology.  Per hematology, no further w/up warranted.  If starts to decline, further w/up then.        Relevant Orders   CBC with Differential/Platelet   Ferritin   Aortic atherosclerosis (HCC)    Continue pravastatin.  Increase to $RemoveBef'40mg'[id number]$  q day.       Relevant Medications   pravastatin (PRAVACHOL) 40 MG tablet   CKD (chronic kidney disease) stage 3, GFR 30-59 ml/min (HCC)    Avoid antiinflammatories.  Stay hdrated.  Follow metabolic panel.        Essential hypertension    Continue hctz, metoprolol, amlodipine and losartan.  Blood pressure as outlined.  Follow pressures.  Follow metabolic panel.       Relevant Medications   pravastatin (PRAVACHOL) 40 MG tablet   Other Relevant Orders   Basic metabolic panel   Fall    Recent fall as outlined. Discussed.  Using cane.  Slow position changes and movement.  No head injury.  Follow.  Life line information given.       GERD (gastroesophageal reflux disease)    No upper symptoms reported. On protonix.       Healthcare maintenance    Physical today 05/02/21.        Hyperglycemia    Low carb diet and exercise.  Follow met b and a1c.       Relevant Orders   Hemoglobin A1c   Mild depression (HCC)    Continue citalopram.  Overall appears to be handling things well.  Follow.        Osteoporosis    prolia 01/2021.       Parkinsons ([place])    Followed by neurology.  Stable.       Primary cancer of upper inner quadrant of right female breast ([place])    On femara.  Followed by  oncology.       Pure hypercholesterolemia    On pravastatin.  Reviewed last cholesterol results.  Increase pravastatin to $RemoveBefore'40mg'[id number]$  q day.  Low cholesterol diet and exercise.  Follow lipid panel and liver function tests.        Relevant Medications   pravastatin (PRAVACHOL) 40 MG tablet   Other Relevant Orders   Hepatic function panel   Lipid panel    Other Visit Diagnoses    Routine general medical examination at a health care facility    -  Primary       Einar Pheasant, MD

## 2021-05-02 NOTE — Telephone Encounter (Signed)
Pt wanted to know if Dr. Nicki Reaper could place her lab at On[facility] that way she only has to do labs once in aug

## 2021-05-08 ENCOUNTER — Encounter: Payer: Self-pay | Admitting: Internal Medicine

## 2021-05-08 DIAGNOSIS — Z0000 Encounter for general adult medical examination without abnormal findings: Secondary | ICD-10-CM | POA: Insufficient documentation

## 2021-05-08 DIAGNOSIS — I700 Atherosclerosis of aorta: Secondary | ICD-10-CM | POA: Insufficient documentation

## 2021-05-08 NOTE — Assessment & Plan Note (Addendum)
Recent fall as outlined. Discussed.  Using cane.  Slow position changes and movement.  No head injury.  Follow.  Life line information given.

## 2021-05-08 NOTE — Assessment & Plan Note (Signed)
prolia 01/2021.

## 2021-05-08 NOTE — Assessment & Plan Note (Signed)
Continue hctz, metoprolol, amlodipine and losartan.  Blood pressure as outlined.  Follow pressures.  Follow metabolic panel.

## 2021-05-08 NOTE — Assessment & Plan Note (Signed)
Continue citalopram.  Overall appears to be handling things well.  Follow.   

## 2021-05-08 NOTE — Assessment & Plan Note (Signed)
On femara.  Followed by oncology.

## 2021-05-08 NOTE — Assessment & Plan Note (Signed)
Low carb diet and exercise.  Follow met b and a1c.  

## 2021-05-08 NOTE — Assessment & Plan Note (Signed)
Continue pravastatin.  Increase to 40mg  q day.

## 2021-05-08 NOTE — Assessment & Plan Note (Signed)
Physical today 05/02/21.

## 2021-05-08 NOTE — Assessment & Plan Note (Signed)
Follow cbc.  Followed by hematology.  Per hematology, no further w/up warranted.  If starts to decline, further w/up then.

## 2021-05-08 NOTE — Assessment & Plan Note (Signed)
Avoid antiinflammatories.  Stay hdrated.  Follow metabolic panel.   

## 2021-05-08 NOTE — Assessment & Plan Note (Signed)
No upper symptoms reported.  On protonix.   

## 2021-05-08 NOTE — Assessment & Plan Note (Signed)
On pravastatin.  Reviewed last cholesterol results.  Increase pravastatin to 40mg  q day.  Low cholesterol diet and exercise.  Follow lipid panel and liver function tests.

## 2021-05-08 NOTE — Assessment & Plan Note (Signed)
Followed by neurology.  Stable  

## 2021-05-10 DIAGNOSIS — R300 Dysuria: Secondary | ICD-10-CM | POA: Diagnosis not present

## 2021-05-10 DIAGNOSIS — N390 Urinary tract infection, site not specified: Secondary | ICD-10-CM | POA: Diagnosis not present

## 2021-05-11 NOTE — Telephone Encounter (Signed)
Patient scheduled for fasting labs at our office on 8/17. She is requesting to do the labs for Dr Grayland Ormond at our office as well so she only has to be stuck once. He has BMP ordered. Will be checked with her fasting labs in August.

## 2021-05-18 ENCOUNTER — Encounter: Payer: Medicare Other | Admitting: Pain Medicine

## 2021-05-18 DIAGNOSIS — Z79891 Long term (current) use of opiate analgesic: Secondary | ICD-10-CM | POA: Insufficient documentation

## 2021-05-18 NOTE — Progress Notes (Deleted)
Patient called and canceled the appointment indicating that she was sick.

## 2021-05-19 DIAGNOSIS — M545 Low back pain, unspecified: Secondary | ICD-10-CM | POA: Diagnosis not present

## 2021-05-19 DIAGNOSIS — R109 Unspecified abdominal pain: Secondary | ICD-10-CM | POA: Diagnosis not present

## 2021-05-19 DIAGNOSIS — R300 Dysuria: Secondary | ICD-10-CM | POA: Diagnosis not present

## 2021-05-19 DIAGNOSIS — N390 Urinary tract infection, site not specified: Secondary | ICD-10-CM | POA: Diagnosis not present

## 2021-05-19 DIAGNOSIS — R1031 Right lower quadrant pain: Secondary | ICD-10-CM | POA: Diagnosis not present

## 2021-05-26 DIAGNOSIS — M545 Low back pain, unspecified: Secondary | ICD-10-CM | POA: Diagnosis not present

## 2021-05-31 NOTE — Patient Instructions (Addendum)
____________________________________________________________________________________________  Preparing for your procedure (without sedation)  Procedure appointments are limited to planned procedures: No Prescription Refills. No disability issues will be discussed. No medication changes will be discussed.  Instructions: Oral Intake: Do not eat or drink anything for at least 6 hours prior to your procedure. (Exception: Blood Pressure Medication. See below.) Transportation: Unless otherwise stated by your physician, you may drive yourself after the procedure. Blood Pressure Medicine: Do not forget to take your blood pressure medicine with a sip of water the morning of the procedure. If your Diastolic (lower reading)is above 100 mmHg, elective cases will be cancelled/rescheduled. Blood thinners: These will need to be stopped for procedures. Notify our staff if you are taking any blood thinners. Depending on which one you take, there will be specific instructions on how and when to stop it. Diabetics on insulin: Notify the staff so that you can be scheduled 1st case in the morning. If your diabetes requires high dose insulin, take only  of your normal insulin dose the morning of the procedure and notify the staff that you have done so. Preventing infections: Shower with an antibacterial soap the morning of your procedure.  Build-up your immune system: Take 1000 mg of Vitamin C with every meal (3 times a day) the day prior to your procedure. Antibiotics: Inform the staff if you have a condition or reason that requires you to take antibiotics before dental procedures. Pregnancy: If you are pregnant, call and cancel the procedure. Sickness: If you have a cold, fever, or any active infections, call and cancel the procedure. Arrival: You must be in the facility at least 30 minutes prior to your scheduled procedure. Children: Do not bring any children with you. Dress appropriately: Bring dark clothing  that you would not mind if they get stained. Valuables: Do not bring any jewelry or valuables.  Reasons to call and reschedule or cancel your procedure: (Following these recommendations will minimize the risk of a serious complication.) Surgeries: Avoid having procedures within 2 weeks of any surgery. (Avoid for 2 weeks before or after any surgery). Flu Shots: Avoid having procedures within 2 weeks of a flu shots or . (Avoid for 2 weeks before or after immunizations). Barium: Avoid having a procedure within 7-10 days after having had a radiological study involving the use of radiological contrast. (Myelograms, Barium swallow or enema study). Heart attacks: Avoid any elective procedures or surgeries for the initial 6 months after a "Myocardial Infarction" (Heart Attack). Blood thinners: It is imperative that you stop these medications before procedures. Let us know if you if you take any blood thinner.  Infection: Avoid procedures during or within two weeks of an infection (including chest colds or gastrointestinal problems). Symptoms associated with infections include: Localized redness, fever, chills, night sweats or profuse sweating, burning sensation when voiding, cough, congestion, stuffiness, runny nose, sore throat, diarrhea, nausea, vomiting, cold or Flu symptoms, recent or current infections. It is specially important if the infection is over the area that we intend to treat. Heart and lung problems: Symptoms that may suggest an active cardiopulmonary problem include: cough, chest pain, breathing difficulties or shortness of breath, dizziness, ankle swelling, uncontrolled high or unusually low blood pressure, and/or palpitations. If you are experiencing any of these symptoms, cancel your procedure and contact your primary care physician for an evaluation.  Remember:  Regular Business hours are:  Monday to Thursday 8:00 AM to 4:00 PM  Provider's Schedule: Milinda Pointer, MD:  Procedure  days: Tuesday and  Thursday 7:30 AM to 4:00 PM  Bilal Lateef, MD:  Procedure days: Monday and Wednesday 7:30 AM to 4:00 PM ____________________________________________________________________________________________  ____________________________________________________________________________________________  General Risks and Possible Complications  Patient Responsibilities: It is important that you read this as it is part of your informed consent. It is our duty to inform you of the risks and possible complications associated with treatments offered to you. It is your responsibility as a patient to read this and to ask questions about anything that is not clear or that you believe was not covered in this document.  Patient's Rights: You have the right to refuse treatment. You also have the right to change your mind, even after initially having agreed to have the treatment done. However, under this last option, if you wait until the last second to change your mind, you may be charged for the materials used up to that point.  Introduction: Medicine is not an exact science. Everything in Medicine, including the lack of treatment(s), carries the potential for danger, harm, or loss (which is by definition: Risk). In Medicine, a complication is a secondary problem, condition, or disease that can aggravate an already existing one. All treatments carry the risk of possible complications. The fact that a side effects or complications occurs, does not imply that the treatment was conducted incorrectly. It must be clearly understood that these can happen even when everything is done following the highest safety standards.  No treatment: You can choose not to proceed with the proposed treatment alternative. The "PRO(s)" would include: avoiding the risk of complications associated with the therapy. The "CON(s)" would include: not getting any of the treatment benefits. These benefits fall under one of three  categories: diagnostic; therapeutic; and/or palliative. Diagnostic benefits include: getting information which can ultimately lead to improvement of the disease or symptom(s). Therapeutic benefits are those associated with the successful treatment of the disease. Finally, palliative benefits are those related to the decrease of the primary symptoms, without necessarily curing the condition (example: decreasing the pain from a flare-up of a chronic condition, such as incurable terminal cancer).  General Risks and Complications: These are associated to most interventional treatments. They can occur alone, or in combination. They fall under one of the following six (6) categories: no benefit or worsening of symptoms; bleeding; infection; nerve damage; allergic reactions; and/or death. No benefits or worsening of symptoms: In Medicine there are no guarantees, only probabilities. No healthcare provider can ever guarantee that a medical treatment will work, they can only state the probability that it may. Furthermore, there is always the possibility that the condition may worsen, either directly, or indirectly, as a consequence of the treatment. Bleeding: This is more common if the patient is taking a blood thinner, either prescription or over the counter (example: Goody Powders, Fish oil, Aspirin, Garlic, etc.), or if suffering a condition associated with impaired coagulation (example: Hemophilia, cirrhosis of the liver, low platelet counts, etc.). However, even if you do not have one on these, it can still happen. If you have any of these conditions, or take one of these drugs, make sure to notify your treating physician. Infection: This is more common in patients with a compromised immune system, either due to disease (example: diabetes, cancer, human immunodeficiency virus [HIV], etc.), or due to medications or treatments (example: therapies used to treat cancer and rheumatological diseases). However, even if you  do not have one on these, it can still happen. If you have any of these conditions, or   take one of these drugs, make sure to notify your treating physician. Nerve Damage: This is more common when the treatment is an invasive one, but it can also happen with the use of medications, such as those used in the treatment of cancer. The damage can occur to small secondary nerves, or to large primary ones, such as those in the spinal cord and brain. This damage may be temporary or permanent and it may lead to impairments that can range from temporary numbness to permanent paralysis and/or brain death. Allergic Reactions: Any time a substance or material comes in contact with our body, there is the possibility of an allergic reaction. These can range from a mild skin rash (contact dermatitis) to a severe systemic reaction (anaphylactic reaction), which can result in death. Death: In general, any medical intervention can result in death, most of the time due to an unforeseen complication. ____________________________________________________________________________________________ ____________________________________________________________________________________________  Medication Rules  Purpose: To inform patients, and their family members, of our rules and regulations.  Applies to: All patients receiving prescriptions (written or electronic).  Pharmacy of record: Pharmacy where electronic prescriptions will be sent. If written prescriptions are taken to a different pharmacy, please inform the nursing staff. The pharmacy listed in the electronic medical record should be the one where you would like electronic prescriptions to be sent.  Electronic prescriptions: In compliance with the [place] (STOP) Act of 2017 (Session Lanny Cramp [phone]), effective December 18, 2018, all controlled substances must be electronically prescribed. Calling prescriptions to the pharmacy will  cease to exist.  Prescription refills: Only during scheduled appointments. Applies to all prescriptions.  NOTE: The following applies primarily to controlled substances (Opioid* Pain Medications).   Type of encounter (visit): For patients receiving controlled substances, face-to-face visits are required. (Not an option or up to the patient.)  Patient's responsibilities: Pain Pills: Bring all pain pills to every appointment (except for procedure appointments). Pill Bottles: Bring pills in original pharmacy bottle. Always bring the newest bottle. Bring bottle, even if empty. Medication refills: You are responsible for knowing and keeping track of what medications you take and those you need refilled. The day before your appointment: write a list of all prescriptions that need to be refilled. The day of the appointment: give the list to the admitting nurse. Prescriptions will be written only during appointments. No prescriptions will be written on procedure days. If you forget a medication: it will not be "Called in", "Faxed", or "electronically sent". You will need to get another appointment to get these prescribed. No early refills. Do not call asking to have your prescription filled early. Prescription Accuracy: You are responsible for carefully inspecting your prescriptions before leaving our office. Have the discharge nurse carefully go over each prescription with you, before taking them home. Make sure that your name is accurately spelled, that your address is correct. Check the name and dose of your medication to make sure it is accurate. Check the number of pills, and the written instructions to make sure they are clear and accurate. Make sure that you are given enough medication to last until your next medication refill appointment. Taking Medication: Take medication as prescribed. When it comes to controlled substances, taking less pills or less frequently than prescribed is permitted and  encouraged. Never take more pills than instructed. Never take medication more frequently than prescribed.  Inform other Doctors: Always inform, all of your healthcare providers, of all the medications you take. Pain Medication from other  Providers: You are not allowed to accept any additional pain medication from any other Doctor or Healthcare provider. There are two exceptions to this rule. (see below) In the event that you require additional pain medication, you are responsible for notifying us, as stated below. Cough Medicine: Often these contain an opioid, such as codeine or hydrocodone. Never accept or take cough medicine containing these opioids if you are already taking an opioid* medication. The combination may cause respiratory failure and death. Medication Agreement: You are responsible for carefully reading and following our Medication Agreement. This must be signed before receiving any prescriptions from our practice. Safely store a copy of your signed Agreement. Violations to the Agreement will result in no further prescriptions. (Additional copies of our Medication Agreement are available upon request.) Laws, Rules, & Regulations: All patients are expected to follow all Federal and [employer], [employer], Rules, [employer] [facility]. Ignorance of the Laws does not constitute a valid excuse.  Illegal drugs and Controlled Substances: The use of illegal substances (including, but not limited to marijuana and its derivatives) and/or the illegal use of any controlled substances is strictly prohibited. Violation of this rule may result in the immediate and permanent discontinuation of any and all prescriptions being written by our practice. The use of any illegal substances is prohibited. Adopted CDC guidelines & recommendations: Target dosing levels will be at or below 60 MME/day. Use of benzodiazepines** is not recommended.  Exceptions: There are only two exceptions to the rule of not receiving pain  medications from other Healthcare Providers. Exception #1 (Emergencies): In the event of an emergency (i.e.: accident requiring emergency care), you are allowed to receive additional pain medication. However, you are responsible for: As soon as you are able, call our office [phone], at any time of the day or night, and leave a message stating your name, the date and nature of the emergency, and the name and dose of the medication prescribed. In the event that your call is answered by a member of our staff, make sure to document and save the date, time, and the name of the person that took your information.  Exception #2 (Planned Surgery): In the event that you are scheduled by another doctor or dentist to have any type of surgery or procedure, you are allowed (for a period no longer than 30 days), to receive additional pain medication, for the acute post-op pain. However, in this case, you are responsible for picking up a copy of our "Post-op Pain Management for Surgeons" handout, and giving it to your surgeon or dentist. This document is available at our office, and does not require an appointment to obtain it. Simply go to our office during business hours (Monday-Thursday from 8:00 AM to 4:00 PM) (Friday 8:00 AM to 12:00 Noon) or if you have a scheduled appointment with [place], prior to your surgery, and ask for it by name. In addition, you are responsible for: calling our office [phone], at any time of the day or night, and leaving a message stating your name, name of your surgeon, type of surgery, and date of procedure or surgery. Failure to comply with your responsibilities may result in termination of therapy involving the controlled substances.  *Opioid medications include: morphine, codeine, oxycodone, oxymorphone, hydrocodone, hydromorphone, meperidine, tramadol, tapentadol, buprenorphine, fentanyl, methadone. **Benzodiazepine medications include: diazepam (Valium), alprazolam (Xanax),  clonazepam (Klonopine), lorazepam (Ativan), clorazepate (Tranxene), chlordiazepoxide (Librium), estazolam (Prosom), oxazepam (Serax), temazepam (Restoril), triazolam (Halcion) (Last updated: 11/15/2020) ____________________________________________________________________________________________  ____________________________________________________________________________________________  Medication  Recommendations and Reminders  Applies to: All patients receiving prescriptions (written and/or electronic).  Medication Rules & Regulations: These rules and regulations exist for your safety and that of others. They are not flexible and neither are we. Dismissing or ignoring them will be considered "non-compliance" with medication therapy, resulting in complete and irreversible termination of such therapy. (See document titled "Medication Rules" for more details.) In all conscience, because of safety reasons, we cannot continue providing a therapy where the patient does not follow instructions.  Pharmacy of record:  Definition: This is the pharmacy where your electronic prescriptions will be sent.  We do not endorse any particular pharmacy, however, we have experienced problems with [facility] not securing enough medication supply for the community. We do not restrict you in your choice of pharmacy. However, once we write for your prescriptions, we will NOT be re-sending more prescriptions to fix restricted supply problems created by your pharmacy, or your insurance.  The pharmacy listed in the electronic medical record should be the one where you want electronic prescriptions to be sent. If you choose to change pharmacy, simply notify our nursing staff.  Recommendations: Keep all of your pain medications in a safe place, under lock and key, even if you live alone. We will NOT replace lost, stolen, or damaged medication. After you fill your prescription, take 1 week's worth of pills and put them away in  a safe place. You should keep a separate, properly labeled bottle for this purpose. The remainder should be kept in the original bottle. Use this as your primary supply, until it runs out. Once it's gone, then you know that you have 1 week's worth of medicine, and it is time to come in for a prescription refill. If you do this correctly, it is unlikely that you will ever run out of medicine. To make sure that the above recommendation works, it is very important that you make sure your medication refill appointments are scheduled at least 1 week before you run out of medicine. To do this in an effective manner, make sure that you do not leave the office without scheduling your next medication management appointment. Always ask the nursing staff to show you in your prescription , when your medication will be running out. Then arrange for the receptionist to get you a return appointment, at least 7 days before you run out of medicine. Do not wait until you have 1 or 2 pills left, to come in. This is very poor planning and does not take into consideration that we may need to cancel appointments due to bad weather, sickness, or emergencies affecting our staff. DO NOT ACCEPT A "Partial Fill": If for any reason your pharmacy does not have enough pills/tablets to completely fill or refill your prescription, do not allow for a "partial fill". The law allows the pharmacy to complete that prescription within 72 hours, without requiring a new prescription. If they do not fill the rest of your prescription within those 72 hours, you will need a separate prescription to fill the remaining amount, which we will NOT provide. If the reason for the partial fill is your insurance, you will need to talk to the pharmacist about payment alternatives for the remaining tablets, but again, DO NOT ACCEPT A PARTIAL FILL, unless you can trust your pharmacist to obtain the remainder of the pills within 72 hours.  Prescription refills and/or  changes in medication(s):  Prescription refills, and/or changes in dose or medication, will be conducted only  during scheduled medication management appointments. (Applies to both, written and electronic prescriptions.) No refills on procedure days. No medication will be changed or started on procedure days. No changes, adjustments, and/or refills will be conducted on a procedure day. Doing so will interfere with the diagnostic portion of the procedure. No phone refills. No medications will be "called into the pharmacy". No Fax refills. No weekend refills. No Holliday refills. No after hours refills.  Remember:  Business hours are:  Monday to Thursday 8:00 AM to 4:00 PM Provider's Schedule: Milinda Pointer, MD - Appointments are:  Medication management: Monday and Wednesday 8:00 AM to 4:00 PM Procedure day: Tuesday and Thursday 7:30 AM to 4:00 PM Gillis Santa, MD - Appointments are:  Medication management: Tuesday and Thursday 8:00 AM to 4:00 PM Procedure day: Monday and Wednesday 7:30 AM to 4:00 PM (Last update: 07/07/2020) ____________________________________________________________________________________________  ____________________________________________________________________________________________  CBD (cannabidiol) WARNING  Applicable to: All individuals currently taking or considering taking CBD (cannabidiol) and, more important, all patients taking opioid analgesic controlled substances (pain medication). (Example: oxycodone; oxymorphone; hydrocodone; hydromorphone; morphine; methadone; tramadol; tapentadol; fentanyl; buprenorphine; butorphanol; dextromethorphan; meperidine; codeine; etc.)  Legal status: CBD remains a Schedule I drug prohibited for any use. CBD is illegal with one exception. In the [place], CBD has a limited Transport planner (FDA) approval for the treatment of two specific types of epilepsy disorders. Only one CBD product has been approved  by the FDA for this purpose: "Epidiolex". FDA is aware that some companies are marketing products containing cannabis and cannabis-derived compounds in ways that violate the [employer], Drug and Cosmetic Act [facility] Act) and that may put the health and safety of consumers at risk. The FDA, a Federal agency, has not enforced the CBD status since 2018.   Legality: Some manufacturers ship CBD products nationally, which is illegal. Often such products are sold online and are therefore available throughout the country. CBD is openly sold in head shops and health food stores in some states where such sales have not been explicitly legalized. Selling unapproved products with unsubstantiated therapeutic claims is not only a violation of the law, but also can put patients at risk, as these products have not been proven to be safe or effective. Federal illegality makes it difficult to conduct research on CBD.  Reference: "FDA Regulation of Cannabis and Cannabis-Derived Products, Including Cannabidiol (CBD)" - [employer]  Warning: CBD is not FDA approved and has not undergo the same manufacturing controls as prescription drugs.  This means that the purity and safety of available CBD may be questionable. Most of the time, despite manufacturer's claims, it is contaminated with THC (delta-9-tetrahydrocannabinol - the chemical in marijuana responsible for the "HIGH").  When this is the case, the [facility] contaminant will trigger a positive urine drug screen (UDS) test for Marijuana (carboxy-THC). Because a positive UDS for any illicit substance is a violation of our medication agreement, your opioid analgesics (pain medicine) may be permanently discontinued.  MORE ABOUT CBD  General Information: CBD  is a derivative of the Marijuana (cannabis sativa) plant discovered in 89. It is one of the 113 identified  substances found in Marijuana. It accounts for up to 40% of the plant's extract. As of 2018, preliminary clinical studies on CBD included research for the treatment of anxiety, movement disorders, and pain. CBD is available and consumed in multiple forms, including inhalation of smoke or vapor, as an aerosol spray, and by mouth. It may be supplied as an oil containing  CBD, capsules, dried cannabis, or as a liquid solution. CBD is thought not to be as psychoactive as THC (delta-9-tetrahydrocannabinol - the chemical in marijuana responsible for the "HIGH"). Studies suggest that CBD may interact with different biological target receptors in the body, including cannabinoid and other neurotransmitter receptors. As of 2018 the mechanism of action for its biological effects has not been determined.  Side-effects  Adverse reactions: Dry mouth, diarrhea, decreased appetite, fatigue, drowsiness, malaise, weakness, sleep disturbances, and others.  Drug interactions: CBC may interact with other medications such as blood-thinners. (Last update: 07/24/2020) ____________________________________________________________________________________________  ____________________________________________________________________________________________  Drug Holidays (Slow)  What is a "Drug Holiday"? Drug Holiday: is the name given to the period of time during which a patient stops taking a medication(s) for the purpose of eliminating tolerance to the drug.  Benefits Improved effectiveness of opioids. Decreased opioid dose needed to achieve benefits. Improved pain with lesser dose.  What is tolerance? Tolerance: is the progressive decreased in effectiveness of a drug due to its repetitive use. With repetitive use, the body gets use to the medication and as a consequence, it loses its effectiveness. This is a common problem seen with opioid pain medications. As a result, a larger dose of the drug is needed to achieve the  same effect that used to be obtained with a smaller dose.  How long should a "Drug Holiday" last? You should stay off of the pain medicine for at least 14 consecutive days. (2 weeks)  Should I stop the medicine "cold [place]"? No. You should always coordinate with your Pain Specialist so that he/she can provide you with the correct medication dose to make the transition as smoothly as possible.  How do I stop the medicine? Slowly. You will be instructed to decrease the daily amount of pills that you take by one (1) pill every seven (7) days. This is called a "slow downward taper" of your dose. For example: if you normally take four (4) pills per day, you will be asked to drop this dose to three (3) pills per day for seven (7) days, then to two (2) pills per day for seven (7) days, then to one (1) per day for seven (7) days, and at the end of those last seven (7) days, this is when the "Drug Holiday" would start.   Will I have withdrawals? By doing a "slow downward taper" like this one, it is unlikely that you will experience any significant withdrawal symptoms. Typically, what triggers withdrawals is the sudden stop of a high dose opioid therapy. Withdrawals can usually be avoided by slowly decreasing the dose over a prolonged period of time. If you do not follow these instructions and decide to stop your medication abruptly, withdrawals may be possible.  What are withdrawals? Withdrawals: refers to the wide range of symptoms that occur after stopping or dramatically reducing opiate drugs after heavy and prolonged use. Withdrawal symptoms do not occur to patients that use low dose opioids, or those who take the medication sporadically. Contrary to benzodiazepine (example: Valium, Xanax, etc.) or alcohol withdrawals ("Delirium Tremens"), opioid withdrawals are not lethal. Withdrawals are the physical manifestation of the body getting rid of the excess receptors.  Expected Symptoms Early symptoms of  withdrawal may include: Agitation Anxiety Muscle aches Increased tearing Insomnia Runny nose Sweating Yawning  Late symptoms of withdrawal may include: Abdominal cramping Diarrhea Dilated pupils Goose bumps Nausea Vomiting  Will I experience withdrawals? Due to the slow nature of the taper, it is very  unlikely that you will experience any.  What is a slow taper? Taper: refers to the gradual decrease in dose.  (Last update: 07/07/2020) ____________________________________________________________________________________________

## 2021-05-31 NOTE — Progress Notes (Signed)
PROVIDER NOTE: Information contained herein reflects review and annotations entered in association with encounter. Interpretation of such information and data should be left to medically-trained personnel. Information provided to patient can be located elsewhere in the medical record under "Patient Instructions". Document created using STT-dictation technology, any transcriptional errors that may result from process are unintentional.    Patient: Wanda Martinez  Service Category: E/M  Provider: Gaspar Cola, MD  DOB: Nov 05, 1935  DOS: 06/01/2021  Specialty: Interventional Pain Management  MRN: [id number]  Setting: Ambulatory outpatient  PCP: Einar Pheasant, MD  Type: Established Patient    Referring Provider: Einar Pheasant, MD  Location: Office  Delivery: Face-to-face     HPI  Wanda Martinez, a 86 y.o. year old female, is here today because of her Chronic pain syndrome [G89.4]. Wanda Martinez primary complain today is Back Pain, Shoulder Pain (right), and Knee Pain (right) Last encounter: My last encounter with her was on 05/18/2021. Pertinent problems: Wanda Martinez has Primary cancer of upper inner quadrant of right female breast ([place]); Headache; Parkinson's disease ([place]); Chronic low back pain (1ry area of Pain) (Bilateral) (R>L) w/o sciatica; Lumbar spondylosis; Chronic hip pain (Bilateral); Chronic neck pain; Cervical spondylosis; Chronic cervical radicular pain (Right); Diffuse myofascial pain syndrome; Neurogenic pain; Chronic upper back pain (Right); Myofascial pain syndrome (Right) (cervicothoracic); Lumbar facet syndrome (Bilateral) (R>L); Malignant neoplasm of right female breast ([place]); Cervical facet hypertrophy; Cervical facet syndrome ([place]); Chronic shoulder pain (Right); Chronic pain syndrome; Chronic sacroiliac joint pain (Left); Chronic sacroiliac joint pain (Right); 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, L4-5, L5-S1) (Right); Lumbar spinal stenosis (with neurogenic  claudication) (L3-4); Chronic lower extremity pain (2ry area of Pain) (Right); Chronic lumbar radicular pain (S1) (Right); Trochanteric bursitis of hip (Bilateral); Spondylosis without myelopathy or radiculopathy, lumbosacral region; Trigger point with back pain (Right); DDD (degenerative disc disease), lumbosacral; Chronic upper extremity pain (Right); Chronic thoracic back pain (Bilateral) (L>R); Trigger point of thoracic region (Bilateral) (L>R); Other specified dorsopathies, sacral and sacrococcygeal region; Sacroiliac joint dysfunction (Right); Osteoarthritis of sacroiliac joint (Right); Somatic dysfunction of sacroiliac joint (Right); Chronic musculoskeletal pain; Facial pain; Chronic hip pain (Right); Osteoarthritis of hip (Right); Left ear pain; 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 Pain Assessment: Severity of Chronic pain is reported as a 2 /10. Location: Back Lower, Right/radiates down right side to foot, effects pinkie toe and the one next to it.. Onset:  . Quality: Aching, Burning. Timing:  . Modifying factor(s): rest, pillow between legs "i lay on left side", meds, heat. Vitals:  height is _0  (1.626 m) and weight is 178 lb (80.7 kg). Her temperature is 96.8 F (36 C) (abnormal). Her blood pressure is 183/95 (abnormal) and her pulse is 56 (abnormal). Her respiration is 16 and oxygen saturation is 100%.   Reason for encounter: medication management.   The patient indicates doing well with the current medication regimen. No adverse reactions or side effects reported to the medications.  UDS updated today.  The patient indicates that the right intra-articular steroid knee  injection provided her with approximately 50% relief of the pain.  When asked, she indicated that the pain is all over the knee and there is not a specific area that hurts more than the other.  Review of her last  knee x-ray shows evidence of tricompartmental osteoarthritis that would explain why all 3 compartments continue to hurt.  Today we have decided to go ahead and schedule the patient for viscosupplementation treatments of the right knee.  We will put her on the schedule for an intra-articular knee injection using Monovisc.  The patient was informed of the plan and I also provided her with information regarding the viscosupplementation therapy, how it works, and how long it should last.  I also informed her that should therapy not provide her with good relief of the pain, the neck step would be to consider an MRI of the knee.  She understood and accepted.  The patient also refers having attained a 50% relief of the pain in the area of the right shoulder.  I asked her if she could pinpoint where she had most of the pain and she indicated having pain in the anterior aspect of the shoulder over the [facility] joint as well as pain in the back of the shoulder over the suprascapular muscle.  Review of the patient's x-ray revealed that the last time she had an x-ray done of the right shoulder was on 01/09/2014.  At that time he already stated that she had AC joint osteoarthritis.  She continues with this pain and therefore today I will be ordering a repeat x-ray of that shoulder.  I will also schedule her for an intra-articular injection of the right shoulder into the area of the [facility] joint and the glenohumeral joint.  I will do that first and determine on the day of the procedure whether or not her pain completely goes away with that.  If it does not and she continues to have some pain over the area of the suprascapular muscle, will evaluate at that time for the possibility of a trigger point into the suprascapular muscle  versus a suprascapular nerve block.  The plan was again shared with the patient who understood and accepted.  Today she also mentioned still having some occasional issues with her lower back.  Reviewing her 12/30/2019 lumbar MRI we do see that she has some dextroscoliosis with the apex at the L2 level with some slight worsening of her DDD and facet DJD.  She states that although it does occasionally bother her, the lumbar facet block treatment that we gave her on 03/15/2021 continues to help her with back pain and she feels that at this point she does not need to have anything else done in the lower back.  RTCB: 08/29/2021 Nonopioids transfer 12/21/2020: Gabapentin  Post-Procedure Evaluation  Procedure (04/19/2021): Diagnostic right intra-articular steroid knee injection #1 + diagnostic/therapeutic right trapezius trigger point injection #1.  No fluoroscopy or IV sedation Pre-procedure pain level: 10/10 Post-procedure: 0/10 (100% relief)  Sedation: None.  Effectiveness during initial hour after procedure(Ultra-Short Term Relief): 100 %.  Local anesthetic used: Long-acting (4-6 hours) Effectiveness: Defined as any analgesic benefit obtained secondary to the administration of local anesthetics. This carries significant diagnostic value as to the etiological location, or anatomical origin, of the pain. Duration of benefit is expected to coincide with the duration of the local anesthetic used.  Effectiveness during initial 4-6 hours after procedure(Short-Term Relief): 100 %.  Long-term benefit: Defined as any relief past the pharmacologic duration of the local anesthetics.  Effectiveness past the initial 6 hours after procedure(Long-Term Relief): 50 %.  Current benefits: Defined as benefit that persist at this time.   Analgesia:   Ongoing 50% relief of the pain in the right  knee and right trapezius muscle pain over the right shoulder area. Function: Wanda Martinez reports improvement in function ROM: [name] reports improvement in ROM   Pharmacotherapy Assessment  Analgesic: Hydrocodone/APAP 5/325, 1 tab PO q 8 hrs (15 mg/day of hydrocodone) MME/day: 15 mg/day.   Monitoring: [place] PMP: PDMP reviewed during this encounter.       Pharmacotherapy: No side-effects or adverse reactions reported. Compliance: No problems identified. Effectiveness: Clinically acceptable.  Ignatius Specking, RN  06/01/2021  9:41 AM  Sign when Signing Visit Nursing Pain Medication Assessment:  Safety precautions to be maintained throughout the outpatient stay will include: orient to surroundings, keep bed in low position, maintain call bell within reach at all times, provide assistance with transfer out of bed and ambulation.  Medication Inspection Compliance: Pill count conducted under aseptic conditions, in front of the patient. Neither the pills nor the bottle was removed from the patient's sight at any time. Once count was completed pills were immediately returned to the patient in their original bottle.  Medication: See above Pill/Patch Count:  33 of 90 pills remain Pill/Patch Appearance: Markings consistent with prescribed medication Bottle Appearance: Standard pharmacy container. Clearly labeled. Filled Date: 5 / 3 / 2022 Last Medication intake:  Yesterday     UDS:  Summary  Date Value Ref Range Status  07/07/2020 Note  Final    Comment:    ==================================================================== ToxASSURE Select 13 (MW) ==================================================================== Test                             Result       Flag       Units  Drug Present and Declared for Prescription Verification   Hydrocodone                    389          EXPECTED   ng/mg creat   Dihydrocodeine                 131          EXPECTED   ng/mg creat   Norhydrocodone                 1443         EXPECTED   ng/mg creat    Sources of hydrocodone include scheduled prescription medications.     Dihydrocodeine and norhydrocodone are expected metabolites of    hydrocodone. Dihydrocodeine is also available as a scheduled    prescription medication.  ==================================================================== Test                      Result    Flag   Units      Ref Range   Creatinine              102              mg/dL      >=20 ==================================================================== Declared Medications:  The flagging and interpretation on this report are based on the  following declared medications.  Unexpected results may arise from  inaccuracies in the declared medications.   **Note: The testing scope of this panel includes these medications:   Hydrocodone (Norco)   **Note: The testing scope of this panel does not include the  following reported medications:   Acetaminophen (Norco)  Amlodipine (Norvasc)  Aspirin  Calcium  Citalopram (Celexa)  Cyclobenzaprine (Flexeril)  Donepezil (Aricept)  Gabapentin (Neurontin)  Hydrochlorothiazide (Hydrodiuril)  Letrozole (Femara)  Losartan (Cozaar)  Lubiprostone (Amitiza)  Magnesium (Mag-Ox)  Meloxicam (Mobic)  Metoprolol (Toprol)  Multivitamin  Nitrofurantoin (Macrobid)  Nystatin (Mycostatin)  Omega-3 Fatty Acids  Omeprazole (Prilosec)  Pantoprazole (Protonix)  Pravastatin (Pravachol) ==================================================================== For clinical consultation, please call [phone]. ====================================================================      ROS  Constitutional: Denies any fever or chills Gastrointestinal: No reported hemesis, hematochezia, vomiting, or acute GI distress Musculoskeletal: Denies any acute onset joint swelling, redness, loss of ROM, or weakness Neurological: No reported episodes of acute onset apraxia, aphasia, dysarthria, agnosia, amnesia, paralysis, loss of coordination, or loss of consciousness  Medication Review  Glucosamine Sulfate,  HYDROcodone-acetaminophen, amLODipine, aspirin, calcium carbonate, citalopram, cyclobenzaprine, donepezil, gabapentin, hydrochlorothiazide, letrozole, losartan, lubiprostone, magnesium oxide, melatonin, meloxicam, metoprolol succinate, multivitamin, omeprazole, pantoprazole, and pravastatin  History Review  Allergy: Wanda Martinez is allergic to sulfa antibiotics and vesicare [solifenacin]. Drug: Wanda Martinez  reports no history of drug use. Alcohol:  reports no history of alcohol use. Tobacco:  reports that she has never smoked. She has never used smokeless tobacco. Social: Wanda Martinez  reports that she has never smoked. She has never used smokeless tobacco. She reports that she does not drink alcohol and does not use drugs. Medical:  has a past medical history of Acute postoperative pain (10/25/2017), Anemia, Arm pain (07/26/2015), Arthritis, Arthritis, degenerative (03/26/2014), Back pain (11/01/2013), Bladder infection (06/2018), Breast cancer ([place]), Breast cancer ([place]), CHEST PAIN (04/29/2010), Chronic cystitis, Cystocele (02/22/2013), Cystocele, midline (08/19/2013), Degeneration of intervertebral disc of lumbosacral region (03/26/2014), DYSPNEA (04/29/2010), Enthesopathy of hip (03/26/2014), GERD (gastroesophageal reflux disease), Hiatal hernia, HTN (hypertension), Hypokalemia (06/2018), Hyponatremia (06/2018), LBP (low back pain) (03/26/2014), Neck pain (11/01/2013), Parkinson disease ([place]), Pneumonia (06/2018), Sinusitis (02/07/2015), Skin lesions (07/12/2014), and Urinary incontinence. Surgical: Ms. Salman  has a past surgical history that includes Tonsillectomy and adenoidectomy (81); Vesicovaginal fistula closure w/ TAH (1983); Breast surgery (1986); Breast enhancement surgery (1987); Breast implant removal; Breast implant removal (Right, 08/29/2012); Mastectomy (08/2012); Abdominal hysterectomy; Colonoscopy with propofol (N/A, 09/13/2016); and Breast biopsy (2013). Family: family history includes Colon polyps in her  father; Diabetes in her father; Parkinson's disease in her mother; Stroke in her father and mother.  Laboratory Chemistry Profile   Renal Lab Results  Component Value Date   BUN 17 03/17/2021   CREATININE 0.91 03/17/2021   BCR 20 06/11/2020   GFR 58.38 (L) 03/17/2021   GFRAA >60 07/27/2020   GFRNONAA 47 (L) 01/27/2021     Hepatic Lab Results  Component Value Date   AST 24 03/17/2021   ALT 16 03/17/2021   ALBUMIN 4.5 03/17/2021   ALKPHOS 23 (L) 03/17/2021     Electrolytes Lab Results  Component Value Date   NA 135 03/17/2021   K 3.8 03/17/2021   CL 99 03/17/2021   CALCIUM 9.4 03/17/2021   MG 2.2 07/17/2018     Bone Lab Results  Component Value Date   25OHVITD1 42 07/17/2018   25OHVITD2 <1.0 07/17/2018   25OHVITD3 42 07/17/2018     Inflammation (CRP: Acute Phase) (ESR: Chronic Phase) Lab Results  Component Value Date   CRP 3 07/17/2018   ESRSEDRATE 27 07/17/2018       Note: Above Lab results reviewed.  Recent Imaging Review  DG Knee 1-2 Views Right CLINICAL DATA:  Fall, right knee pain  EXAM: RIGHT KNEE - 1-2 VIEW  COMPARISON:  None.  FINDINGS: Two view radiograph right knee demonstrates normal alignment. No fracture or dislocation. Minimal osteophyte  formation is present in keeping with at least mild tricompartmental degenerative arthritis. No effusion. Soft tissues are unremarkable.  IMPRESSION: Mild degenerative change.  No acute fracture or dislocation.  Electronically Signed   By: Fidela Salisbury MD   On: 03/15/2021 23:05 [place] C-ARM 1-60 MIN NO REPORT Fluoro was used, but no Radiologist interpretation will be provided.  Please refer to "NOTES" tab for provider progress note. Note: Reviewed        Physical Exam  General appearance: Well nourished, well developed, and well hydrated. In no apparent acute distress Mental status: Alert, oriented x 3 (person, place, & time)       Respiratory: No evidence of acute respiratory  distress Eyes: PERLA Vitals: BP (!) 183/95 Comment: "I just took BP med"  Pulse (!) 56   Temp (!) 96.8 F (36 C)   Resp 16   Ht $R'5\' 4"'ZJ$  (1.626 m)   Wt 178 lb (80.7 kg)   LMP 12/18/1981   SpO2 100%   BMI 30.55 kg/m  BMI: Estimated body mass index is 30.55 kg/m as calculated from the following:   Height as of this encounter: $RemoveBeforeD'5\' 4"'tisJmmDLKAFPho$  (1.626 m).   Weight as of this encounter: 178 lb (80.7 kg). Ideal: Ideal body weight: 54.7 kg (120 lb 9.5 oz) Adjusted ideal body weight: 65.1 kg (143 lb 8.9 oz)  Assessment   Status Diagnosis  Controlled Controlled Controlled 1. Chronic pain syndrome   2. Chronic low back pain (1ry area of Pain) (Bilateral) (R>L) w/o sciatica   3. Lumbar facet syndrome (Bilateral) (R>L)   4. Chronic lower extremity pain (2ry area of Pain) (Right)   5. Chronic knee pain (Right)   6. Osteoarthritis of knee (Right)   7. Chronic shoulder pain (Right)   8. Trigger point of shoulder region (Right)   9. Pharmacologic therapy   10. Chronic use of opiate for therapeutic purpose      Updated Problems: No problems updated.  Plan of Care  Problem-specific:  No problem-specific Assessment & Plan notes found for this encounter.  Wanda Martinez has a current medication list which includes the following long-term medication(s): amlodipine, calcium carbonate, cyclobenzaprine, donepezil, hydrochlorothiazide, hydrocodone-acetaminophen, [START ON 06/30/2021] hydrocodone-acetaminophen, [START ON 07/30/2021] hydrocodone-acetaminophen, losartan, magnesium oxide, meloxicam, metoprolol succinate, omeprazole, pantoprazole, pravastatin, and gabapentin.  Pharmacotherapy (Medications Ordered): Meds ordered this encounter  Medications   HYDROcodone-acetaminophen (NORCO/VICODIN) 5-325 MG tablet    Sig: Take 1 tablet by mouth every 8 (eight) hours as needed for severe pain. Must last 30 days    Dispense:  90 tablet    Refill:  0    Not a duplicate. Do NOT delete! Dispense 1 day  early if closed on refill date. Avoid benzodiazepines within 8 hours of opioids. Do not send refill requests.   HYDROcodone-acetaminophen (NORCO/VICODIN) 5-325 MG tablet    Sig: Take 1 tablet by mouth every 8 (eight) hours as needed for severe pain. Must last 30 days    Dispense:  90 tablet    Refill:  0    Not a duplicate. Do NOT delete! Dispense 1 day early if closed on refill date. Avoid benzodiazepines within 8 hours of opioids. Do not send refill requests.   HYDROcodone-acetaminophen (NORCO/VICODIN) 5-325 MG tablet    Sig: Take 1 tablet by mouth every 8 (eight) hours as needed for severe pain. Must last 30 days    Dispense:  90 tablet    Refill:  0    Not a duplicate. Do  NOT delete! Dispense 1 day early if closed on refill date. Avoid benzodiazepines within 8 hours of opioids. Do not send refill requests.    Orders:  Orders Placed This Encounter  Procedures   SHOULDER INJECTION    Standing Status:   Future    Standing Expiration Date:   08/01/2021    Scheduling Instructions:     Procedure: Intra-articular shoulder (Glenohumeral) joint and (AC) Acromioclavicular joint injection     Side: Right-sided     Level: Glenohumeral joint and (AC) Acromioclavicular joint     Sedation: Patient's choice.     Timeframe: As permitted by the schedule    Order Specific Question:   Where will this procedure be performed?    Answer:   [facility]   SUPRASCAPULAR NERVE BLOCK    For shoulder pain.    Standing Status:   Future    Standing Expiration Date:   09/01/2021    Scheduling Instructions:     Purpose: Diagnostic     Laterality: Right-sided     Level(s): Suprascapular notch     Sedation: Patient's choice.     Scheduling Timeframe: ASAA    Order Specific Question:   Where will this procedure be performed?    Answer:   [facility]   KNEE INJECTION    Hyalgan knee injection. Please order Monovisc.    Standing Status:   Future    Standing Expiration Date:   07/01/2021     Scheduling Instructions:     Procedure: Intra-articular Hyalgan Knee injection #1     Side: Right-sided     Sedation: None     Timeframe: ASAA    Order Specific Question:   Where will this procedure be performed?    Answer:   [facility]   DG Shoulder Right    Standing Status:   Future    Standing Expiration Date:   07/01/2021    Scheduling Instructions:     Imaging must be done as soon as possible. Inform patient that order will expire within 30 days and I will not renew it.    Order Specific Question:   Reason for Exam (SYMPTOM  OR DIAGNOSIS REQUIRED)    Answer:   Right shoulder pain    Order Specific Question:   Preferred imaging location?    Answer:   [facility]    Order Specific Question:   Call Results- Best Contact Number?    Answer:   [phone] ([facility])    Order Specific Question:   Release to patient    Answer:   Immediate   ToxASSURE Select 13 (MW), Urine    Volume: 30 ml(s). Minimum 3 ml of urine is needed. Document temperature of fresh sample. Indications: Long term (current) use of opiate analgesic (I29.798)    Order Specific Question:   Release to patient    Answer:   Immediate    Follow-up plan:   Return for Procedure (no sedation): (R) IA Shoulder inj. #1 + (R) IA Monovisc knee injection #1 (ASAA).     Interventional Therapies  Risk  Complexity Considerations:   Estimated body mass index is 31.41 kg/m as calculated from the following:   Height as of this encounter: _0  (1.626 m).   Weight as of this encounter: 183 lb (83 kg). WNL   Planned  Pending:   Pending further evaluation   Under consideration:   Diagnostic bilateral cervical facet block  Possible bilateral cervical facet RFA  Completed:   Therapeutic right superior/lateral trapezius muscle TPI x1 (04/19/2021)  Therapeutic right thoracic back TPI x2 (10/17/2018)  Therapeutic left thoracic back TPI x1 (10/17/2018) Therapeutic right quadratus lumborum and erector  spinae muscle TPI x1 (02/12/2018) Therapeutic right lumbar facet block x8 (03/15/2021)  Therapeutic left lumbar facet block x4 (03/15/2021)  Therapeutic right lumbar facet RFA x2 (06/04/2018) (100/90/75/75-100) Therapeutic left lumbar facet RFA x1 (02/12/2018) (100/60/90/>75) Diagnostic right SI joint block x3 (12/21/2020)  Diagnostic left SI joint block x1 (08/09/2017)  Therapeutic right SI joint RFA x3 (06/05/2019)  Therapeutic midline L2-3 LESI x1 (10/04/2017) (100/100/85/>50) Therapeutic right L3-4 LESI x2 (11/04/2020) Therapeutic right L4-5 LESI x1 (05/20/2020) Therapeutic right L5-S1 LESI x2 (12/04/2019) Therapeutic right L5 TFESI x3 (12/04/2019)  Therapeutic right S1 TFESI x1 (11/04/2020) Diagnostic bilateral superficial trochanteric bursa injection x1 (04/30/2018)  Diagnostic right IA hip injection x1 (02/13/2019)  Diagnostic/therapeutic right IA knee injection (steroid) x1 (04/19/2021)    Palliative options:   Palliative bilateral lumbar facet RFA  Palliative bilateral lumbar facet blocks     Recent Visits Date Type Provider Dept  04/19/21 Procedure visit Milinda Pointer, MD [facility]  03/15/21 Procedure visit Milinda Pointer, MD [facility]  03/14/21 Telemedicine Milinda Pointer, MD [facility]  Showing recent visits within past 90 days and meeting all other requirements Today's Visits Date Type Provider Dept  06/01/21 Office Visit Milinda Pointer, MD [facility]  Showing today's visits and meeting all other requirements Future Appointments Date Type Provider Dept  06/07/21 Appointment Milinda Pointer, MD [facility]  Showing future appointments within next 90 days and meeting all other requirements I discussed the assessment and treatment plan with the patient. The patient was provided an opportunity to ask questions and all were answered. The patient agreed with the plan and demonstrated an understanding of the  instructions.  Patient advised to call back or seek an in-person evaluation if the symptoms or condition worsens.  Duration of encounter: 30 minutes.  Note by: Gaspar Cola, MD Date: 06/01/2021; Time: 10:33 AM

## 2021-06-01 ENCOUNTER — Ambulatory Visit
Admission: RE | Admit: 2021-06-01 | Discharge: 2021-06-01 | Disposition: A | Discharge status: Home / Self Care | Payer: Medicare Other | Source: Ambulatory Visit | Attending: Pain Medicine | Admitting: Pain Medicine

## 2021-06-01 ENCOUNTER — Ambulatory Visit: Payer: Medicare Other | Attending: Pain Medicine | Admitting: Pain Medicine

## 2021-06-01 ENCOUNTER — Other Ambulatory Visit: Payer: Self-pay

## 2021-06-01 VITALS — BP 183/95 | HR 56 | Temp 96.8°F | Resp 16 | Ht 64.0 in | Wt 178.0 lb

## 2021-06-01 DIAGNOSIS — G8929 Other chronic pain: Secondary | ICD-10-CM | POA: Insufficient documentation

## 2021-06-01 DIAGNOSIS — G894 Chronic pain syndrome: Secondary | ICD-10-CM | POA: Diagnosis not present

## 2021-06-01 DIAGNOSIS — M25561 Pain in right knee: Secondary | ICD-10-CM

## 2021-06-01 DIAGNOSIS — M25511 Pain in right shoulder: Secondary | ICD-10-CM

## 2021-06-01 DIAGNOSIS — Z79891 Long term (current) use of opiate analgesic: Secondary | ICD-10-CM | POA: Diagnosis not present

## 2021-06-01 DIAGNOSIS — Z79899 Other long term (current) drug therapy: Secondary | ICD-10-CM | POA: Diagnosis not present

## 2021-06-01 DIAGNOSIS — M1711 Unilateral primary osteoarthritis, right knee: Secondary | ICD-10-CM

## 2021-06-01 DIAGNOSIS — M47816 Spondylosis without myelopathy or radiculopathy, lumbar region: Secondary | ICD-10-CM

## 2021-06-01 DIAGNOSIS — M79604 Pain in right leg: Secondary | ICD-10-CM | POA: Diagnosis not present

## 2021-06-01 DIAGNOSIS — M545 Low back pain, unspecified: Secondary | ICD-10-CM

## 2021-06-01 MED ORDER — HYDROCODONE-ACETAMINOPHEN 5-325 MG PO TABS: 1.0000 | ORAL_TABLET | Freq: Three times a day (TID) | ORAL | 0 refills | Status: DC | PRN

## 2021-06-01 NOTE — Progress Notes (Signed)
Nursing Pain Medication Assessment:  Safety precautions to be maintained throughout the outpatient stay will include: orient to surroundings, keep bed in low position, maintain call bell within reach at all times, provide assistance with transfer out of bed and ambulation.  Medication Inspection Compliance: Pill count conducted under aseptic conditions, in front of the patient. Neither the pills nor the bottle was removed from the patient's sight at any time. Once count was completed pills were immediately returned to the patient in their original bottle.  Medication: See above Pill/Patch Count:  33 of 90 pills remain Pill/Patch Appearance: Markings consistent with prescribed medication Bottle Appearance: Standard pharmacy container. Clearly labeled. Filled Date: 5 / 3 / 2022 Last Medication intake:  Yesterday

## 2021-06-02 ENCOUNTER — Ambulatory Visit: Payer: Medicare Other | Admitting: Internal Medicine

## 2021-06-03 DIAGNOSIS — M5441 Lumbago with sciatica, right side: Secondary | ICD-10-CM | POA: Diagnosis not present

## 2021-06-03 DIAGNOSIS — M5136 Other intervertebral disc degeneration, lumbar region: Secondary | ICD-10-CM | POA: Diagnosis not present

## 2021-06-03 DIAGNOSIS — M47816 Spondylosis without myelopathy or radiculopathy, lumbar region: Secondary | ICD-10-CM | POA: Diagnosis not present

## 2021-06-03 DIAGNOSIS — M4120 Other idiopathic scoliosis, site unspecified: Secondary | ICD-10-CM | POA: Diagnosis not present

## 2021-06-03 DIAGNOSIS — G8929 Other chronic pain: Secondary | ICD-10-CM | POA: Diagnosis not present

## 2021-06-07 ENCOUNTER — Other Ambulatory Visit: Payer: Self-pay

## 2021-06-07 ENCOUNTER — Ambulatory Visit
Admission: RE | Admit: 2021-06-07 | Discharge: 2021-06-07 | Disposition: A | Discharge status: Home / Self Care | Payer: Medicare Other | Source: Ambulatory Visit | Attending: Pain Medicine | Admitting: Pain Medicine

## 2021-06-07 ENCOUNTER — Encounter: Payer: Self-pay | Admitting: Pain Medicine

## 2021-06-07 ENCOUNTER — Ambulatory Visit (HOSPITAL_BASED_OUTPATIENT_CLINIC_OR_DEPARTMENT_OTHER): Payer: Medicare Other | Admitting: Pain Medicine

## 2021-06-07 VITALS — BP 170/72 | HR 59 | Temp 96.8°F | Resp 16 | Ht 64.0 in | Wt 179.0 lb

## 2021-06-07 DIAGNOSIS — M1711 Unilateral primary osteoarthritis, right knee: Secondary | ICD-10-CM | POA: Insufficient documentation

## 2021-06-07 DIAGNOSIS — M25511 Pain in right shoulder: Secondary | ICD-10-CM | POA: Insufficient documentation

## 2021-06-07 DIAGNOSIS — G8929 Other chronic pain: Secondary | ICD-10-CM | POA: Diagnosis not present

## 2021-06-07 DIAGNOSIS — M19011 Primary osteoarthritis, right shoulder: Secondary | ICD-10-CM | POA: Diagnosis not present

## 2021-06-07 DIAGNOSIS — S46001S Unspecified injury of muscle(s) and tendon(s) of the rotator cuff of right shoulder, sequela: Secondary | ICD-10-CM | POA: Diagnosis not present

## 2021-06-07 DIAGNOSIS — M25561 Pain in right knee: Secondary | ICD-10-CM | POA: Insufficient documentation

## 2021-06-07 MED ORDER — LACTATED RINGERS IV SOLN: 1000.0000 mL | Freq: Once | INTRAVENOUS | Status: DC

## 2021-06-07 MED ORDER — METHYLPREDNISOLONE ACETATE 80 MG/ML IJ SUSP
INTRAMUSCULAR | Status: AC
  Filled 2021-06-07: qty 1

## 2021-06-07 MED ORDER — LIDOCAINE HCL (PF) 1 % IJ SOLN
5.0000 mL | Freq: Once | INTRAMUSCULAR | Status: AC
  Administered 2021-06-07: 5 mL

## 2021-06-07 MED ORDER — MIDAZOLAM HCL 5 MG/5ML IJ SOLN
1.0000 mg | INTRAMUSCULAR | Status: DC | PRN
Start: 2021-06-07 — End: 2021-06-07

## 2021-06-07 MED ORDER — LIDOCAINE HCL 2 % IJ SOLN
20.0000 mL | Freq: Once | INTRAMUSCULAR | Status: AC
  Administered 2021-06-07: 200 mg

## 2021-06-07 MED ORDER — METHYLPREDNISOLONE ACETATE 80 MG/ML IJ SUSP
80.0000 mg | Freq: Once | INTRAMUSCULAR | Status: AC
  Administered 2021-06-07: 80 mg via INTRA_ARTICULAR

## 2021-06-07 MED ORDER — ROPIVACAINE HCL 2 MG/ML IJ SOLN
5.0000 mL | Freq: Once | INTRAMUSCULAR | Status: AC
Start: 2021-06-07 — End: 2021-06-07
  Administered 2021-06-07: 5 mL via INTRA_ARTICULAR

## 2021-06-07 MED ORDER — ROPIVACAINE HCL 2 MG/ML IJ SOLN
9.0000 mL | Freq: Once | INTRAMUSCULAR | Status: AC
Start: 2021-06-07 — End: 2021-06-07
  Administered 2021-06-07: 9 mL via INTRA_ARTICULAR

## 2021-06-07 MED ORDER — LIDOCAINE HCL 2 % IJ SOLN
INTRAMUSCULAR | Status: AC
  Filled 2021-06-07: qty 20

## 2021-06-07 MED ORDER — ROPIVACAINE HCL 2 MG/ML IJ SOLN
INTRAMUSCULAR | Status: AC
  Filled 2021-06-07: qty 20

## 2021-06-07 MED ORDER — HYALURONAN 88 MG/4ML IX SOSY
4.0000 mL | PREFILLED_SYRINGE | Freq: Once | INTRA_ARTICULAR | Status: AC
  Administered 2021-06-07: 4 mL via INTRA_ARTICULAR
  Filled 2021-06-07: qty 4

## 2021-06-07 NOTE — Patient Instructions (Signed)

## 2021-06-07 NOTE — Progress Notes (Signed)
Safety precautions to be maintained throughout the outpatient stay will include: orient to surroundings, keep bed in low position, maintain call bell within reach at all times, provide assistance with transfer out of bed and ambulation.  

## 2021-06-07 NOTE — Progress Notes (Signed)
PROVIDER NOTE: Information contained herein reflects review and annotations entered in association with encounter. Interpretation of such information and data should be left to medically-trained personnel. Information provided to patient can be located elsewhere in the medical record under "Patient Instructions". Document created using STT-dictation technology, any transcriptional errors that may result from process are unintentional.    Patient: Wanda Martinez  Service Category: Procedure  Provider: Gaspar Cola, MD  DOB: 18-Jun-1935  DOS: 06/07/2021  Location: [facility]  MRN: [id number]  Setting: Ambulatory - outpatient  Referring Provider: Einar Pheasant, MD  Type: Established Patient  Specialty: Interventional Pain Management  PCP: Einar Pheasant, MD   Primary Reason for Visit: Interventional Pain Management Treatment. CC: Shoulder Pain and Knee Pain (right)  Procedure #1:  Anesthesia, Analgesia, Anxiolysis:  Type: Therapeutic Intra-Articular Monovisc Knee Injection #1  Region: Lateral infrapatellar Knee Region Level: Knee Joint Laterality: Right knee  Type: Local Anesthesia Indication(s): Analgesia         Local Anesthetic: Lidocaine 1-2% Route: Infiltration ([place]/IM) IV Access: Declined Sedation: Declined   Position: Sitting   Indications: 1. Chronic hip pain (Right)   2. Osteoarthritis of knee (Right)    Procedure #2:  Anesthesia, Analgesia, Anxiolysis:  Type: Diagnostic Glenohumeral Joint (shoulder) Injection          Primary Purpose: Diagnostic Region: Superior Shoulder Area Level:  Shoulder Target Area: Glenohumeral Joint (shoulder) Approach: Anterior approach. Laterality: Right  Type: Moderate (Conscious) Sedation combined with Local Anesthesia Indication(s): Analgesia and Anxiety Route: Intravenous (IV) IV Access: Secured Sedation: Meaningful verbal contact was maintained at all times during the procedure  Local Anesthetic: Lidocaine  1-2%  Position: Supine   Indications: 1. Chronic shoulder pain (Right)   2. Osteoarthritis of AC (acromioclavicular) joint (Right)   3. Osteoarthritis of shoulder (Right)   4. Unspecified injury of muscle(s) and tendon(s) of the rotator cuff of shoulder, sequela (Right)    Pain Score: Pre-procedure: 4 /10 Post-procedure: 1  (when putting clothes on)/10   Pre-op H&P Assessment:  Wanda Martinez is a 86 y.o. (year old), female patient, seen today for interventional treatment. She  has a past surgical history that includes Tonsillectomy and adenoidectomy (81); Vesicovaginal fistula closure w/ TAH (1983); Breast surgery (1986); Breast enhancement surgery (1987); Breast implant removal; Breast implant removal (Right, 08/29/2012); Mastectomy (08/2012); Abdominal hysterectomy; Colonoscopy with propofol (N/A, 09/13/2016); and Breast biopsy (2013). Wanda Martinez has a current medication list which includes the following prescription(s): amlodipine, aspirin, calcium carbonate, citalopram, cyclobenzaprine, donepezil, glucosamine sulfate, hydrochlorothiazide, hydrocodone-acetaminophen, [START ON 06/30/2021] hydrocodone-acetaminophen, [START ON 07/30/2021] hydrocodone-acetaminophen, letrozole, losartan, lubiprostone, magnesium oxide, melatonin, meloxicam, metoprolol succinate, multivitamin, omeprazole, pantoprazole, pravastatin, and gabapentin, and the following Facility-Administered Medications: lactated ringers and midazolam. Her primarily concern today is the Shoulder Pain and Knee Pain (right)  Initial Vital Signs:  Pulse/HCG Rate: (!) 58  Temp: (!) 96.8 F (36 C) Resp: 14 BP: (!) 161/82 SpO2: 100 %  BMI: Estimated body mass index is 30.73 kg/m as calculated from the following:   Height as of this encounter: 5\' 4"  (1.626 m).   Weight as of this encounter: 179 lb (81.2 kg).  Risk Assessment: Allergies: Reviewed. She is allergic to sulfa antibiotics and vesicare [solifenacin].  Allergy Precautions: None  required Coagulopathies: Reviewed. None identified.  Blood-thinner therapy: None at this time Active Infection(s): Reviewed. None identified. Wanda Martinez is afebrile  Site Confirmation: Wanda Martinez was asked to confirm the procedure and laterality before marking the site Procedure checklist: Completed Consent: Before  the procedure and under the influence of no sedative(s), amnesic(s), or anxiolytics, the patient was informed of the treatment options, risks and possible complications. To fulfill our ethical and legal obligations, as recommended by the American Medical Association's Code of Ethics, I have informed the patient of my clinical impression; the nature and purpose of the treatment or procedure; the risks, benefits, and possible complications of the intervention; the alternatives, including doing nothing; the risk(s) and benefit(s) of the alternative treatment(s) or procedure(s); and the risk(s) and benefit(s) of doing nothing. The patient was provided information about the general risks and possible complications associated with the procedure. These may include, but are not limited to: failure to achieve desired goals, infection, bleeding, organ or nerve damage, allergic reactions, paralysis, and death. In addition, the patient was informed of those risks and complications associated to the procedure, such as failure to decrease pain; infection; bleeding; organ or nerve damage with subsequent damage to sensory, motor, and/or autonomic systems, resulting in permanent pain, numbness, and/or weakness of one or several areas of the body; allergic reactions; (i.e.: anaphylactic reaction); and/or death. Furthermore, the patient was informed of those risks and complications associated with the medications. These include, but are not limited to: allergic reactions (i.e.: anaphylactic or anaphylactoid reaction(s)); adrenal axis suppression; blood sugar elevation that in diabetics may result in ketoacidosis  or comma; water retention that in patients with history of congestive heart failure may result in shortness of breath, pulmonary edema, and decompensation with resultant heart failure; weight gain; swelling or edema; medication-induced neural toxicity; particulate matter embolism and blood vessel occlusion with resultant organ, and/or nervous system infarction; and/or aseptic necrosis of one or more joints. Finally, the patient was informed that Medicine is not an exact science; therefore, there is also the possibility of unforeseen or unpredictable risks and/or possible complications that may result in a catastrophic outcome. The patient indicated having understood very clearly. We have given the patient no guarantees and we have made no promises. Enough time was given to the patient to ask questions, all of which were answered to the patient's satisfaction. Wanda Martinez has indicated that she wanted to continue with the procedure. Attestation: I, the ordering provider, attest that I have discussed with the patient the benefits, risks, side-effects, alternatives, likelihood of achieving goals, and potential problems during recovery for the procedure that I have provided informed consent. Date  Time: 06/07/2021  2:04 PM  Pre-Procedure Preparation:  Monitoring: As per clinic protocol. Respiration, ETCO2, SpO2, BP, heart rate and rhythm monitor placed and checked for adequate function Safety Precautions: Patient was assessed for positional comfort and pressure points before starting the procedure. Time-out: I initiated and conducted the "Time-out" before starting the procedure, as per protocol. The patient was asked to participate by confirming the accuracy of the "Time Out" information. Verification of the correct person, site, and procedure were performed and confirmed by me, the nursing staff, and the patient. "Time-out" conducted as per Joint Commission's Universal Protocol (UP.01.01.01). Time:  1500  Description of Procedure #1:  Target Area: Knee Joint Approach: Just above the Lateral tibial plateau, lateral to the infrapatellar tendon. Area Prepped: Entire knee area, from the mid-thigh to the mid-shin. DuraPrep (Iodine Povacrylex [0.7% available iodine] and Isopropyl Alcohol, 74% w/w) Safety Precautions: Aspiration looking for blood return was conducted prior to all injections. At no point did we inject any substances, as a needle was being advanced. No attempts were made at seeking any paresthesias. Safe injection practices and needle disposal  techniques used. Medications properly checked for expiration dates. SDV (single dose vial) medications used. Description of the Procedure: Protocol guidelines were followed. The patient was placed in position over the fluoroscopy table. The target area was identified and the area prepped in the usual manner. Skin & deeper tissues infiltrated with local anesthetic. Appropriate amount of time allowed to pass for local anesthetics to take effect. The procedure needles were then advanced to the target area. Proper needle placement secured. Negative aspiration confirmed. Solution injected in intermittent fashion, asking for systemic symptoms every 0.5cc of injectate. The needles were then removed and the area cleansed, making sure to leave some of the prepping solution back to take advantage of its long term bactericidal properties. Vitals:   06/07/21 1404 06/07/21 1454 06/07/21 1504  BP: (!) 161/82 (!) 171/73 (!) 170/72  Pulse: (!) 58 (!) 58 (!) 59  Resp: 14 16 16   Temp: (!) 96.8 F (36 C)    TempSrc: Temporal    SpO2: 100% 100% 100%  Weight: 179 lb (81.2 kg)    Height: 5\' 4"  (1.626 m)      Start Time: 1501 hrs.  Materials:  Needle(s) Type: Regular needle Gauge: 25G Length: 1.5-in Medication(s): Please see orders for medications and dosing details.  Imaging Guidance for procedure #1:  Type of Imaging Technique: None used Indication(s):  N/A Exposure Time: No patient exposure Contrast: None used. Fluoroscopic Guidance: N/A Ultrasound Guidance: N/A Interpretation: N/A  Description of Procedure #2:  Area Prepped: Entire shoulder Area DuraPrep (Iodine Povacrylex [0.7% available iodine] and Isopropyl Alcohol, 74% w/w) Safety Precautions: Aspiration looking for blood return was conducted prior to all injections. At no point did we inject any substances, as a needle was being advanced. No attempts were made at seeking any paresthesias. Safe injection practices and needle disposal techniques used. Medications properly checked for expiration dates. SDV (single dose vial) medications used. Description of the Procedure: Protocol guidelines were followed. The patient was placed in position over the procedure table. The target area was identified and the area prepped in the usual manner. Skin & deeper tissues infiltrated with local anesthetic. Appropriate amount of time allowed to pass for local anesthetics to take effect. The procedure needles were then advanced to the target area. Proper needle placement secured. Negative aspiration confirmed. Solution injected in intermittent fashion, asking for systemic symptoms every 0.5cc of injectate. The needles were then removed and the area cleansed, making sure to leave some of the prepping solution back to take advantage of its long term bactericidal properties.         Vitals:   06/07/21 1404 06/07/21 1454 06/07/21 1504  BP: (!) 161/82 (!) 171/73 (!) 170/72  Pulse: (!) 58 (!) 58 (!) 59  Resp: 14 16 16   Temp: (!) 96.8 F (36 C)    TempSrc: Temporal    SpO2: 100% 100% 100%  Weight: 179 lb (81.2 kg)    Height: 5\' 4"  (1.626 m)     End Time: 1504 hrs. Materials:  Needle(s) Type: Spinal Needle Gauge: 22G Length: 3.5-in Medication(s): Please see orders for medications and dosing details.  Imaging Guidance (Non-Spinal) for procedure #2:  Type of Imaging Technique: Fluoroscopy Guidance  (Non-Spinal) Indication(s): Assistance in needle guidance and placement for procedures requiring needle placement in or near specific anatomical locations not easily accessible without such assistance. Exposure Time: Please see nurses notes. Contrast: None used. Fluoroscopic Guidance: I was personally present during the use of fluoroscopy. "Tunnel Vision Technique" used to obtain the  best possible view of the target area. Parallax error corrected before commencing the procedure. "Direction-depth-direction" technique used to introduce the needle under continuous pulsed fluoroscopy. Once target was reached, antero-posterior, oblique, and lateral fluoroscopic projection used confirm needle placement in all planes. Images permanently stored in EMR. Interpretation: No contrast injected. I personally interpreted the imaging intraoperatively. Adequate needle placement confirmed in multiple planes. Permanent images saved into the patient's record.  Antibiotic Prophylaxis:   Anti-infectives (From admission, onward)    None      Indication(s): None identified  Post-operative Assessment:  Post-procedure Vital Signs:  Pulse/HCG Rate: (!) 59  Temp: (!) 96.8 F (36 C) Resp: 16 BP: (!) 170/72 SpO2: 100 %  EBL: None  Complications: No immediate post-treatment complications observed by team, or reported by patient.  Note: The patient tolerated the entire procedure well. A repeat set of vitals were taken after the procedure and the patient was kept under observation following institutional policy, for this type of procedure. Post-procedural neurological assessment was performed, showing return to baseline, prior to discharge. The patient was provided with post-procedure discharge instructions, including a section on how to identify potential problems. Should any problems arise concerning this procedure, the patient was given instructions to immediately contact us, at any time, without hesitation. In any  case, we plan to contact the patient by telephone for a follow-up status report regarding this interventional procedure.  Comments:  No additional relevant information.  Plan of Care  Orders:  Orders Placed This Encounter  Procedures   KNEE INJECTION    Hyalgan knee injection to be done by MD.    Scheduling Instructions:     Procedure: Intra-articular Monovisc knee injection #1     Side(s): Right Knee     Sedation: None     Timeframe: Today    Order Specific Question:   Where will this procedure be performed?    Answer:   [facility]   SHOULDER INJECTION    Scheduling Instructions:     Procedure: Intra-articular shoulder (Glenohumeral) joint and (AC) Acromioclavicular joint injection     Side: Right-sided     Level: Glenohumeral joint and (AC) Acromioclavicular joint     Sedation: Patient's choice.     Timeframe: Today    Order Specific Question:   Where will this procedure be performed?    Answer:   [facility]   DG PAIN CLINIC C-ARM 1-60 MIN NO REPORT    Intraoperative interpretation by procedural physician at [place].    Standing Status:   Standing    Number of Occurrences:   1    Order Specific Question:   Reason for exam:    Answer:   Assistance in needle guidance and placement for procedures requiring needle placement in or near specific anatomical locations not easily accessible without such assistance.   Informed Consent Details: Physician/Practitioner Attestation; Transcribe to consent form and obtain patient signature    Note: Always confirm laterality of pain with Wanda Martinez, before procedure. Transcribe to consent form and obtain patient signature.    Order Specific Question:   Physician/Practitioner attestation of informed consent for procedure/surgical case    Answer:   I, the physician/practitioner, attest that I have discussed with the patient the benefits, risks, side effects, alternatives, likelihood of achieving goals and  potential problems during recovery for the procedure that I have provided informed consent.    Order Specific Question:   Procedure    Answer:   Therapeutic, right sided,  intra-articular Hyalgan knee injection    Order Specific Question:   Physician/Practitioner performing the procedure    Answer:   Jyaire Koudelka A. Dossie Arbour, MD    Order Specific Question:   Indication/Reason    Answer:   Chronic right knee pain secondary to primary osteoarthritis of the knee   Provide equipment / supplies at bedside    "Block Tray" (Disposable  single use) Needle type: SpinalRegular Amount/quantity: 1 Size: Short(1.5-inch) Gauge: 25G    Standing Status:   Standing    Number of Occurrences:   1    Order Specific Question:   Specify    Answer:   Block Tray   Informed Consent Details: Physician/Practitioner Attestation; Transcribe to consent form and obtain patient signature    Note: Always confirm laterality of pain with Wanda Martinez, before procedure.    Order Specific Question:   Physician/Practitioner attestation of informed consent for procedure/surgical case    Answer:   I, the physician/practitioner, attest that I have discussed with the patient the benefits, risks, side effects, alternatives, likelihood of achieving goals and potential problems during recovery for the procedure that I have provided informed consent.    Order Specific Question:   Procedure    Answer:   Intra-articular shoulder joint injection under fluoroscopic guidance    Order Specific Question:   Physician/Practitioner performing the procedure    Answer:   Zori Benbrook A. Dossie Arbour, MD    Order Specific Question:   Indication/Reason    Answer:   Chronic shoulder pain secondary to shoulder arthropathy   Provide equipment / supplies at bedside    "Block Tray" (Disposable  single use) Needle type: SpinalSpinal Amount/quantity: 1 Size: Regular (3.5-inch) Gauge: 22G    Standing Status:   Standing    Number of Occurrences:   1    Order  Specific Question:   Specify    Answer:   Block Tray   Chronic Opioid Analgesic:  Hydrocodone/APAP 5/325, 1 tab PO q 8 hrs (15 mg/day of hydrocodone) MME/day: 15 mg/day.   Medications ordered for procedure: Meds ordered this encounter  Medications   lidocaine (XYLOCAINE) 2 % (with pres) injection 400 mg   lidocaine (PF) (XYLOCAINE) 1 % injection 5 mL   ropivacaine (PF) 2 mg/mL (0.2%) (NAROPIN) injection 5 mL   Hyaluronan SOSY 4 mL   lidocaine (XYLOCAINE) 2 % (with pres) injection 400 mg   lactated ringers infusion 1,000 mL   midazolam (VERSED) 5 MG/5ML injection 1-2 mg    Make sure Flumazenil is available in the pyxis when using this medication. If oversedation occurs, administer 0.2 mg IV over 15 sec. If after 45 sec no response, administer 0.2 mg again over 1 min; may repeat at 1 min intervals; not to exceed 4 doses (1 mg)   ropivacaine (PF) 2 mg/mL (0.2%) (NAROPIN) injection 9 mL   methylPREDNISolone acetate (DEPO-MEDROL) injection 80 mg   Medications administered: We administered lidocaine, lidocaine (PF), ropivacaine (PF) 2 mg/mL (0.2%), Hyaluronan, lidocaine, ropivacaine (PF) 2 mg/mL (0.2%), and methylPREDNISolone acetate.  See the medical record for exact dosing, route, and time of administration.  Follow-up plan:   Return in about 2 weeks (around 06/21/2021) for procedure day (afternoon F2F) (PPE).      Interventional Therapies  Risk  Complexity Considerations:   Estimated body mass index is 31.41 kg/m as calculated from the following:   Height as of this encounter: 5\' 4"  (1.626 m).   Weight as of this encounter: 183 lb (83 kg). WNL  Planned  Pending:   Pending further evaluation   Under consideration:   Diagnostic bilateral cervical facet block  Possible bilateral cervical facet RFA    Completed:   Therapeutic right superior/lateral trapezius muscle TPI x1 (04/19/2021)  Therapeutic right thoracic back TPI x2 (10/17/2018)  Therapeutic left thoracic back TPI x1  (10/17/2018) Therapeutic right quadratus lumborum and erector spinae muscle TPI x1 (02/12/2018) Therapeutic right lumbar facet block x8 (03/15/2021)  Therapeutic left lumbar facet block x4 (03/15/2021)  Therapeutic right lumbar facet RFA x2 (06/04/2018) (100/90/75/75-100) Therapeutic left lumbar facet RFA x1 (02/12/2018) (100/60/90/>75) Diagnostic right SI joint block x3 (12/21/2020)  Diagnostic left SI joint block x1 (08/09/2017)  Therapeutic right SI joint RFA x3 (06/05/2019)  Therapeutic midline L2-3 LESI x1 (10/04/2017) (100/100/85/>50) Therapeutic right L3-4 LESI x2 (11/04/2020) Therapeutic right L4-5 LESI x1 (05/20/2020) Therapeutic right L5-S1 LESI x2 (12/04/2019) Therapeutic right L5 TFESI x3 (12/04/2019)  Therapeutic right S1 TFESI x1 (11/04/2020) Diagnostic bilateral superficial trochanteric bursa injection x1 (04/30/2018)  Diagnostic right IA hip injection x1 (02/13/2019)  Diagnostic/therapeutic right IA knee injection (steroid) x1 (04/19/2021)    Palliative options:   Palliative bilateral lumbar facet RFA  Palliative bilateral lumbar facet blocks      Recent Visits Date Type Provider Dept  06/01/21 Office Visit Milinda Pointer, MD [facility]  04/19/21 Procedure visit Milinda Pointer, MD [facility]  03/15/21 Procedure visit Milinda Pointer, MD [facility]  03/14/21 Telemedicine Milinda Pointer, MD [facility]  Showing recent visits within past 90 days and meeting all other requirements Today's Visits Date Type Provider Dept  06/07/21 Procedure visit Milinda Pointer, MD [facility]  Showing today's visits and meeting all other requirements Future Appointments Date Type Provider Dept  07/05/21 Appointment Milinda Pointer, MD [facility]  08/31/21 Appointment Milinda Pointer, MD [facility]  Showing future appointments within next 90 days and meeting all other requirements Disposition:  Discharge home  Discharge (Date  Time): 06/07/2021; 1512 hrs.   Primary Care Physician: Einar Pheasant, MD Location: [facility] Note by: Gaspar Cola, MD Date: 06/07/2021; Time: 3:47 PM  Disclaimer:  Medicine is not an Chief Strategy Officer. The only guarantee in medicine is that nothing is guaranteed. It is important to note that the decision to proceed with this intervention was based on the information collected from the patient. The Data and conclusions were drawn from the patient's questionnaire, the interview, and the physical examination. Because the information was provided in large part by the patient, it cannot be guaranteed that it has not been purposely or unconsciously manipulated. Every effort has been made to obtain as much relevant data as possible for this evaluation. It is important to note that the conclusions that lead to this procedure are derived in large part from the available data. Always take into account that the treatment will also be dependent on availability of resources and existing treatment guidelines, considered by other Pain Management Practitioners as being common knowledge and practice, at the time of the intervention. For Medico-Legal purposes, it is also important to point out that variation in procedural techniques and pharmacological choices are the acceptable norm. The indications, contraindications, technique, and results of the above procedure should only be interpreted and judged by a Board-Certified Interventional Pain Specialist with extensive familiarity and expertise in the same exact procedure and technique.

## 2021-06-08 ENCOUNTER — Telehealth: Payer: Self-pay

## 2021-06-08 ENCOUNTER — Encounter: Payer: Self-pay | Admitting: Oncology

## 2021-06-08 LAB — TOXASSURE SELECT 13 (MW), URINE

## 2021-06-08 NOTE — Telephone Encounter (Signed)
Post procedure phone call. Patient states she is doing good.  

## 2021-06-13 ENCOUNTER — Ambulatory Visit: Payer: Medicare Other | Admitting: Urology

## 2021-06-13 ENCOUNTER — Encounter: Payer: Self-pay | Admitting: Urology

## 2021-06-13 ENCOUNTER — Other Ambulatory Visit: Payer: Self-pay

## 2021-06-13 VITALS — BP 173/103 | HR 57 | Wt 179.0 lb

## 2021-06-13 DIAGNOSIS — N390 Urinary tract infection, site not specified: Secondary | ICD-10-CM | POA: Diagnosis not present

## 2021-06-13 DIAGNOSIS — N3020 Other chronic cystitis without hematuria: Secondary | ICD-10-CM | POA: Diagnosis not present

## 2021-06-13 MED ORDER — NITROFURANTOIN MONOHYD MACRO 100 MG PO CAPS: 100.0000 mg | ORAL_CAPSULE | Freq: Every day | ORAL | 11 refills | Status: DC

## 2021-06-13 NOTE — Progress Notes (Signed)
06/13/2021 11:06 AM   Wanda Martinez [birth year] [id number]  Referring provider: Einar Pheasant, [address] [place],  [place] [address]  Chief Complaint  Patient presents with   Recurrent UTI    HPI: I was consulted to assess the patient's recurrent bladder infections.  She gets frequency and burning that usually respond to antibiotics.  She gets 6 bladder infections a year.  She has had some positive negative cultures on review of medical records  When she is not infected she voids every 1-2 hours and gets up 3 times a night.  She has urge incontinence especially if she holds it too long.  She can might have mild stress incontinence.  She has moderate bedwetting  She has had a hysterectomy  No history of kidney stones or bladder surgery no neurologic issues   PMH: Past Medical History:  Diagnosis Date   Acute postoperative pain 10/25/2017   Anemia    Arm pain 07/26/2015   Arthritis    Arthritis, degenerative 03/26/2014   Back pain 11/01/2013   Bladder infection 06/2018   Breast cancer ([place])    Masectomy - left - 1986    Breast cancer [facility])    Mastectomy-right -2014   CHEST PAIN 04/29/2010   Qualifier: Diagnosis of  By: Wynetta Emery RN, Erika     Chronic cystitis    Cystocele 02/22/2013   Cystocele, midline 08/19/2013   Degeneration of intervertebral disc of lumbosacral region 03/26/2014   DYSPNEA 04/29/2010   Qualifier: Diagnosis of  By: Wynetta Emery RN, Erika     Enthesopathy of hip 03/26/2014   GERD (gastroesophageal reflux disease)    Hiatal hernia    HTN (hypertension)    Hypokalemia 06/2018   Hyponatremia 06/2018   LBP (low back pain) 03/26/2014   Neck pain 11/01/2013   Parkinson disease ([place])    Pneumonia 06/2018   Sinusitis 02/07/2015   Skin lesions 07/12/2014   Urinary incontinence    mixed     Surgical History: Past Surgical History:  Procedure Laterality Date   ABDOMINAL HYSTERECTOMY     BREAST BIOPSY  2013   BREAST ENHANCEMENT  SURGERY  1987   BREAST IMPLANT REMOVAL     BREAST IMPLANT REMOVAL Right 08/29/2012   BREAST SURGERY  1986   s/p left mastectomy   COLONOSCOPY WITH PROPOFOL N/A 09/13/2016   Procedure: COLONOSCOPY WITH PROPOFOL;  Surgeon: Manya Silvas, MD;  Location: [facility];  Service: Endoscopy;  Laterality: N/A;   MASTECTOMY  08/2012   right   TONSILLECTOMY AND ADENOIDECTOMY  79   VESICOVAGINAL FISTULA CLOSURE W/ TAH  1983    Home Medications:  Allergies as of 06/13/2021       Reactions   Sulfa Antibiotics Nausea Only   Vesicare [solifenacin] Other (See Comments)   Constipation Constipation        Medication List        Accurate as of June 13, 2021 11:06 AM. If you have any questions, ask your nurse or doctor.          STOP taking these medications    pravastatin 40 MG tablet Commonly known as: PRAVACHOL Stopped by: Reece Packer, MD       TAKE these medications    amLODipine 2.5 MG tablet Commonly known as: NORVASC TAKE 1 TABLET BY MOUTH ONCE DAILY.   aspirin 81 MG EC tablet Take 81 mg by mouth daily as needed.   calcium carbonate 1500 (600 Ca) MG Tabs tablet Commonly  known as: OSCAL Take 1 tablet by mouth daily.   citalopram 20 MG tablet Commonly known as: CELEXA TAKE 1 & 1/2 TABLETS BY MOUTH ONCE DAILY   cyclobenzaprine 10 MG tablet Commonly known as: FLEXERIL TAKE ONE TABLET BY MOUTH AT BEDTIME.   donepezil 5 MG tablet Commonly known as: ARICEPT   gabapentin 100 MG capsule Commonly known as: NEURONTIN Take 2 capsules (200 mg total) by mouth 2 (two) times daily AND 3-4 capsules (300-400 mg total) at bedtime.   gabapentin 300 MG capsule Commonly known as: NEURONTIN Take by mouth.   GLUCOSAMINE SULFATE PO Take by mouth daily.   Glucosamine-Chondroitin 500-400 MG Caps Take 2 capsules by mouth daily.   hydrochlorothiazide 25 MG tablet Commonly known as: HYDRODIURIL TAKE 1 TABLET BY MOUTH ONCE DAILY. (PATIENT TAKES 1/2 ONCE DAILY AND EXTRA  1/2 IF NEEDED)   HYDROcodone-acetaminophen 5-325 MG tablet Commonly known as: NORCO/VICODIN Take 1 tablet by mouth every 8 (eight) hours as needed for severe pain. Must last 30 days Start taking on: July 30, 2021 What changed: Another medication with the same name was removed. Continue taking this medication, and follow the directions you see here. Changed by: Reece Packer, MD   letrozole 2.5 MG tablet Commonly known as: FEMARA Take 2.5 mg by mouth daily.   losartan 50 MG tablet Commonly known as: COZAAR TAKE (2) TABLETS BY MOUTH ONCE DAILY.   lubiprostone 24 MCG capsule Commonly known as: AMITIZA TAKE 1 CAPSULE BY MOUTH ONCE DAILY WITH BREAKFAST.   magnesium oxide 400 (241.3 Mg) MG tablet Commonly known as: MAG-OX TAKE 1 TABLET BY MOUTH ONCE DAILY.   magnesium oxide 400 MG tablet Commonly known as: MAG-OX Take 1 tablet by mouth daily.   melatonin 1 MG Tabs tablet Take 3 mg by mouth at bedtime.   meloxicam 7.5 MG tablet Commonly known as: MOBIC TAKE 1 TABLET BY MOUTH ONCE DAILY.   metoprolol succinate 25 MG 24 hr tablet Commonly known as: TOPROL-XL TAKE 1 TABLET BY MOUTH TWICE DAILY   multivitamin tablet Take 1 tablet by mouth daily.   Omega-3 1000 MG Caps Take by mouth.   omeprazole 20 MG capsule Commonly known as: PRILOSEC TAKE 1 CAPSULE BY MOUTH TWICE DAILY FOR REFLUX.   pantoprazole 40 MG tablet Commonly known as: PROTONIX TAKE 1 TABLET BY MOUTH ONCE DAILY.        Allergies:  Allergies  Allergen Reactions   Sulfa Antibiotics Nausea Only   Vesicare [Solifenacin] Other (See Comments)    Constipation Constipation    Family History: Family History  Problem Relation Age of Onset   Diabetes Father    Stroke Father    Colon polyps Father    Stroke Mother    Parkinson's disease Mother     Social History:  reports that she has never smoked. She has never used smokeless tobacco. She reports that she does not drink alcohol and does not use  drugs.  ROS:                                        Physical Exam: LMP 12/18/1981   Constitutional:  Alert and oriented, No acute distress. HEENT: [place] AT, moist mucus membranes.  Trachea midline, no masses. Cardiovascular: No clubbing, cyanosis, or edema. Respiratory: Normal respiratory effort, no increased work of breathing. GI: Abdomen is soft, nontender, nondistended, no abdominal masses GU: No CVA tenderness.  No distress Skin: No rashes, bruises or suspicious lesions. Lymph: No cervical or inguinal adenopathy. Neurologic: Grossly intact, no focal deficits, moving all 4 extremities. Psychiatric: Normal mood and affect.  Laboratory Data: Lab Results  Component Value Date   WBC 6.6 03/17/2021   HGB 11.6 (L) 03/17/2021   HCT 34.4 (L) 03/17/2021   MCV 89.6 03/17/2021   PLT 213.0 03/17/2021    Lab Results  Component Value Date   CREATININE 0.91 03/17/2021    No results found for: PSA  No results found for: TESTOSTERONE  Lab Results  Component Value Date   HGBA1C 5.8 03/17/2021    Urinalysis    Component Value Date/Time   COLORURINE YELLOW 10/17/2019 1608   APPEARANCEUR Clear 03/17/2021 1120   LABSPEC 1.020 10/17/2019 1608   LABSPEC 1.006 10/08/2014 1745   PHURINE 6.5 10/17/2019 1608   GLUCOSEU Negative 03/17/2021 1120   GLUCOSEU NEGATIVE 08/23/2018 [place] 10/17/2019 1608   BILIRUBINUR Negative 03/17/2021 1120   BILIRUBINUR Negative 10/08/2014 [place] 10/17/2019 1608   PROTEINUR Negative 03/17/2021 1120   PROTEINUR NEGATIVE 10/17/2019 1608   UROBILINOGEN 0.2 06/11/2020 1516   UROBILINOGEN 0.2 08/23/2018 1412   NITRITE Negative 03/17/2021 1120   NITRITE NEGATIVE 10/17/2019 1608   LEUKOCYTESUR 2+ (A) 03/17/2021 1120   LEUKOCYTESUR NEGATIVE 10/17/2019 1608   LEUKOCYTESUR Trace 10/08/2014 1745    Pertinent Imaging: Urine reviewed.  Urine sent for culture.  Chart reviewed  Assessment & Plan:  Pathophysiology of chronic cystitis discussed.  Role of prophylaxis discussed.  Reassess for pelvic semination cystoscopy in 6 weeks with cystoscopy.  Call if ultrasound abnormal.  Call if urine culture abnormal.  Role of probiotics discussed.  See if mixed incontinence down regulate  Macrobid 100 mg 3x11 sent.  1. Recurrent UTI  - Urinalysis, Complete - CULTURE, URINE COMPREHENSIVE   No follow-ups on file.  Reece Packer, MD  [facility] [address], [place], [place] [address] [phone]

## 2021-06-13 NOTE — Patient Instructions (Signed)
Cystoscopy Cystoscopy is a procedure that is used to help diagnose and sometimes treat conditions that affect the lower urinary tract. The lower urinary tract includes the bladder and the urethra. The urethra is the tube that drains urine from the bladder. Cystoscopy is done using a thin, tube-shaped instrument with a light and camera at the end (cystoscope). The cystoscope may be hard or flexible, depending on the goal of the procedure. The cystoscope is inserted through the urethra, into the bladder. Cystoscopy may be recommended if you have: Urinary tract infections that keep coming back. Blood in the urine (hematuria). An inability to control when you urinate (urinary incontinence) or an overactive bladder. Unusual cells found in a urine sample. A blockage in the urethra, such as a urinary stone. Painful urination. An abnormality in the bladder found during an intravenous pyelogram (IVP) or CT scan. Cystoscopy may also be done to remove a sample of tissue to be examined under a microscope (biopsy). What are the risks? Generally, this is a safe procedure. However, problems may occur, including: Infection. Bleeding.  What happens during the procedure?  You will be given one or more of the following: A medicine to numb the area (local anesthetic). The area around the opening of your urethra will be cleaned. The cystoscope will be passed through your urethra into your bladder. Germ-free (sterile) fluid will flow through the cystoscope to fill your bladder. The fluid will stretch your bladder so that your health care provider can clearly examine your bladder walls. Your doctor will look at the urethra and bladder. The cystoscope will be removed The procedure may vary among health care providers  What can I expect after the procedure? After the procedure, it is common to have: Some soreness or pain in your abdomen and urethra. Urinary symptoms. These include: Mild pain or burning when you  urinate. Pain should stop within a few minutes after you urinate. This may last for up to 1 week. A small amount of blood in your urine for several days. Feeling like you need to urinate but producing only a small amount of urine. Follow these instructions at home: General instructions Return to your normal activities as told by your health care provider.  Do not drive for 24 hours if you were given a sedative during your procedure. Watch for any blood in your urine. If the amount of blood in your urine increases, call your health care provider. If a tissue sample was removed for testing (biopsy) during your procedure, it is up to you to get your test results. Ask your health care provider, or the department that is doing the test, when your results will be ready. Drink enough fluid to keep your urine pale yellow. Keep all follow-up visits as told by your health care provider. This is important. Contact a health care provider if you: Have pain that gets worse or does not get better with medicine, especially pain when you urinate. Have trouble urinating. Have more blood in your urine. Get help right away if you: Have blood clots in your urine. Have abdominal pain. Have a fever or chills. Are unable to urinate. Summary Cystoscopy is a procedure that is used to help diagnose and sometimes treat conditions that affect the lower urinary tract. Cystoscopy is done using a thin, tube-shaped instrument with a light and camera at the end. After the procedure, it is common to have some soreness or pain in your abdomen and urethra. Watch for any blood in your urine.   If the amount of blood in your urine increases, call your health care provider. If you were prescribed an antibiotic medicine, take it as told by your health care provider. Do not stop taking the antibiotic even if you start to feel better. This information is not intended to replace advice given to you by your health care provider. Make  sure you discuss any questions you have with your health care provider. Document Revised: 11/26/2018 Document Reviewed: 11/26/2018 Elsevier Patient Education  2020 Elsevier Inc.  

## 2021-06-14 LAB — URINALYSIS, COMPLETE
Bilirubin, UA: NEGATIVE
Glucose, UA: NEGATIVE
Ketones, UA: NEGATIVE
Nitrite, UA: NEGATIVE
Protein,UA: NEGATIVE
RBC, UA: NEGATIVE
Specific Gravity, UA: 1.01 (ref 1.005–1.030)
Urobilinogen, Ur: 0.2 mg/dL (ref 0.2–1.0)
pH, UA: 7 (ref 5.0–7.5)

## 2021-06-14 LAB — MICROSCOPIC EXAMINATION: Bacteria, UA: NONE SEEN

## 2021-06-15 MED FILL — Hyaluronan Intra-articular Soln Prefilled Syringe 88 MG/4ML: INTRA_ARTICULAR | Qty: 4 | Status: AC

## 2021-06-16 LAB — CULTURE, URINE COMPREHENSIVE

## 2021-07-04 ENCOUNTER — Ambulatory Visit
Admission: RE | Admit: 2021-07-04 | Discharge: 2021-07-04 | Disposition: A | Discharge status: Home / Self Care | Payer: Medicare Other | Source: Ambulatory Visit | Attending: Oncology | Admitting: Oncology

## 2021-07-04 ENCOUNTER — Other Ambulatory Visit: Payer: Self-pay

## 2021-07-04 DIAGNOSIS — M810 Age-related osteoporosis without current pathological fracture: Secondary | ICD-10-CM | POA: Diagnosis not present

## 2021-07-04 DIAGNOSIS — M85851 Other specified disorders of bone density and structure, right thigh: Secondary | ICD-10-CM | POA: Diagnosis not present

## 2021-07-05 ENCOUNTER — Ambulatory Visit: Payer: Medicare Other | Admitting: Pain Medicine

## 2021-07-05 NOTE — Progress Notes (Deleted)
Came in late.  Reschedule.

## 2021-07-13 ENCOUNTER — Ambulatory Visit: Payer: Medicare Other

## 2021-07-20 ENCOUNTER — Ambulatory Visit: Admission: RE | Admit: 2021-07-20 | Payer: Medicare Other | Source: Home / Self Care | Admitting: Urology

## 2021-07-20 ENCOUNTER — Other Ambulatory Visit: Payer: Self-pay

## 2021-07-20 ENCOUNTER — Ambulatory Visit
Admission: RE | Admit: 2021-07-20 | Discharge: 2021-07-20 | Disposition: A | Discharge status: Home / Self Care | Payer: Medicare Other | Source: Ambulatory Visit | Attending: Urology | Admitting: Urology

## 2021-07-20 DIAGNOSIS — N390 Urinary tract infection, site not specified: Secondary | ICD-10-CM | POA: Diagnosis not present

## 2021-07-25 ENCOUNTER — Encounter: Payer: Self-pay | Admitting: Urology

## 2021-07-25 ENCOUNTER — Other Ambulatory Visit: Payer: Self-pay

## 2021-07-25 ENCOUNTER — Ambulatory Visit: Payer: Medicare Other | Admitting: Urology

## 2021-07-25 VITALS — BP 148/78 | HR 84 | Ht 64.0 in | Wt 179.0 lb

## 2021-07-25 DIAGNOSIS — N390 Urinary tract infection, site not specified: Secondary | ICD-10-CM | POA: Diagnosis not present

## 2021-07-25 DIAGNOSIS — N3020 Other chronic cystitis without hematuria: Secondary | ICD-10-CM

## 2021-07-25 NOTE — Progress Notes (Signed)
07/25/2021 10:49 AM   Wanda Martinez 04/13/1935 [id number]  Referring provider: Einar Pheasant, [id number] [place],  [place] [address]  No chief complaint on file.   HPI: I was consulted to assess the patient's recurrent bladder infections.  She gets frequency and burning that usually respond to antibiotics.  She gets 6 bladder infections a year.  She has had some positive negative cultures on review of medical records  When she is not infected she voids every 1-2 hours and gets up 3 times a night.  She has urge incontinence especially if she holds it too long.  She can might have mild stress incontinence.  She has moderate bedwetting  She has had a hysterectomy    Pathophysiology of chronic cystitis discussed.  Role of prophylaxis discussed.  Reassess for pelvic semination cystoscopy in 6 weeks with cystoscopy.  Call if ultrasound abnormal.  Call if urine culture abnormal.  Role of probiotics discussed.  See if mixed incontinence down regulate   Macrobid 100 mg 3x11 sent.  Today Frequency stable.  No hydronephrosis on ultrasound or stones.  Last culture negative.  No infection.  Clinically doing well  Patient underwent flexible cystoscopy utilizing sterile technique.  Bladder mucosa and trigone were normal.  No cystitis.  No carcinoma.  Tolerated well.   PMH: Past Medical History:  Diagnosis Date   Acute postoperative pain 10/25/2017   Anemia    Arm pain 07/26/2015   Arthritis    Arthritis, degenerative 03/26/2014   Back pain 11/01/2013   Bladder infection 06/2018   Breast cancer ([place])    Masectomy - left - 1986    Breast cancer [facility])    Mastectomy-right -2014   CHEST PAIN 04/29/2010   Qualifier: Diagnosis of  By: Wynetta Emery RN, Erika     Chronic cystitis    Cystocele 02/22/2013   Cystocele, midline 08/19/2013   Degeneration of intervertebral disc of lumbosacral region 03/26/2014   DYSPNEA 04/29/2010   Qualifier: Diagnosis of  By: Wynetta Emery RN,  Erika     Enthesopathy of hip 03/26/2014   GERD (gastroesophageal reflux disease)    Hiatal hernia    HTN (hypertension)    Hypokalemia 06/2018   Hyponatremia 06/2018   LBP (low back pain) 03/26/2014   Neck pain 11/01/2013   Parkinson disease ([place])    Pneumonia 06/2018   Sinusitis 02/07/2015   Skin lesions 07/12/2014   Urinary incontinence    mixed     Surgical History: Past Surgical History:  Procedure Laterality Date   ABDOMINAL HYSTERECTOMY     BREAST BIOPSY  2013   BREAST ENHANCEMENT SURGERY  1987   BREAST IMPLANT REMOVAL     BREAST IMPLANT REMOVAL Right 08/29/2012   BREAST SURGERY  1986   s/p left mastectomy   COLONOSCOPY WITH PROPOFOL N/A 09/13/2016   Procedure: COLONOSCOPY WITH PROPOFOL;  Surgeon: Manya Silvas, MD;  Location: [facility];  Service: Endoscopy;  Laterality: N/A;   MASTECTOMY  08/2012   right   TONSILLECTOMY AND ADENOIDECTOMY  79   VESICOVAGINAL FISTULA CLOSURE W/ TAH  1983    Home Medications:  Allergies as of 07/25/2021       Reactions   Sulfa Antibiotics Nausea Only   Vesicare [solifenacin] Other (See Comments)   Constipation Constipation        Medication List        Accurate as of July 25, 2021 10:49 AM. If you have any questions, ask your nurse or doctor.  amLODipine 2.5 MG tablet Commonly known as: NORVASC TAKE 1 TABLET BY MOUTH ONCE DAILY.   aspirin 81 MG EC tablet Take 81 mg by mouth daily as needed.   calcium carbonate 1500 (600 Ca) MG Tabs tablet Commonly known as: OSCAL Take 1 tablet by mouth daily.   citalopram 20 MG tablet Commonly known as: CELEXA TAKE 1 & 1/2 TABLETS BY MOUTH ONCE DAILY   cyclobenzaprine 10 MG tablet Commonly known as: FLEXERIL TAKE ONE TABLET BY MOUTH AT BEDTIME.   donepezil 5 MG tablet Commonly known as: ARICEPT   gabapentin 100 MG capsule Commonly known as: NEURONTIN Take 2 capsules (200 mg total) by mouth 2 (two) times daily AND 3-4 capsules (300-400 mg total) at bedtime.    gabapentin 300 MG capsule Commonly known as: NEURONTIN Take by mouth.   GLUCOSAMINE SULFATE PO Take by mouth daily.   Glucosamine-Chondroitin 500-400 MG Caps Take 2 capsules by mouth daily.   hydrochlorothiazide 25 MG tablet Commonly known as: HYDRODIURIL TAKE 1 TABLET BY MOUTH ONCE DAILY. (PATIENT TAKES 1/2 ONCE DAILY AND EXTRA 1/2 IF NEEDED)   HYDROcodone-acetaminophen 5-325 MG tablet Commonly known as: NORCO/VICODIN Take 1 tablet by mouth every 8 (eight) hours as needed for severe pain. Must last 30 days Start taking on: July 30, 2021   letrozole 2.5 MG tablet Commonly known as: FEMARA Take 2.5 mg by mouth daily.   losartan 50 MG tablet Commonly known as: COZAAR TAKE (2) TABLETS BY MOUTH ONCE DAILY.   lubiprostone 24 MCG capsule Commonly known as: AMITIZA TAKE 1 CAPSULE BY MOUTH ONCE DAILY WITH BREAKFAST.   magnesium oxide 400 (241.3 Mg) MG tablet Commonly known as: MAG-OX TAKE 1 TABLET BY MOUTH ONCE DAILY.   magnesium oxide 400 MG tablet Commonly known as: MAG-OX Take 1 tablet by mouth daily.   melatonin 1 MG Tabs tablet Take 3 mg by mouth at bedtime.   meloxicam 7.5 MG tablet Commonly known as: MOBIC TAKE 1 TABLET BY MOUTH ONCE DAILY.   metoprolol succinate 25 MG 24 hr tablet Commonly known as: TOPROL-XL TAKE 1 TABLET BY MOUTH TWICE DAILY   multivitamin tablet Take 1 tablet by mouth daily.   nitrofurantoin (macrocrystal-monohydrate) 100 MG capsule Commonly known as: MACROBID Take 1 capsule (100 mg total) by mouth daily.   Omega-3 1000 MG Caps Take by mouth.   omeprazole 20 MG capsule Commonly known as: PRILOSEC TAKE 1 CAPSULE BY MOUTH TWICE DAILY FOR REFLUX.   pantoprazole 40 MG tablet Commonly known as: PROTONIX TAKE 1 TABLET BY MOUTH ONCE DAILY.        Allergies:  Allergies  Allergen Reactions   Sulfa Antibiotics Nausea Only   Vesicare [Solifenacin] Other (See Comments)    Constipation Constipation    Family History: Family  History  Problem Relation Age of Onset   Diabetes Father    Stroke Father    Colon polyps Father    Stroke Mother    Parkinson's disease Mother     Social History:  reports that she has never smoked. She has never used smokeless tobacco. She reports that she does not drink alcohol and does not use drugs.  ROS:                                        Physical Exam: LMP 12/18/1981   Constitutional:  Alert and oriented, No acute distress. HEENT: [place] AT, moist mucus  membranes.  Trachea midline, no masses.  Laboratory Data: Lab Results  Component Value Date   WBC 6.6 03/17/2021   HGB 11.6 (L) 03/17/2021   HCT 34.4 (L) 03/17/2021   MCV 89.6 03/17/2021   PLT 213.0 03/17/2021    Lab Results  Component Value Date   CREATININE 0.91 03/17/2021    No results found for: PSA  No results found for: TESTOSTERONE  Lab Results  Component Value Date   HGBA1C 5.8 03/17/2021    Urinalysis    Component Value Date/Time   COLORURINE YELLOW 10/17/2019 1608   APPEARANCEUR Clear 06/13/2021 1059   LABSPEC 1.020 10/17/2019 1608   LABSPEC 1.006 10/08/2014 1745   PHURINE 6.5 10/17/2019 1608   GLUCOSEU Negative 06/13/2021 1059   GLUCOSEU NEGATIVE 08/23/2018 1412   HGBUR NEGATIVE 10/17/2019 1608   BILIRUBINUR Negative 06/13/2021 1059   BILIRUBINUR Negative 10/08/2014 [place] 10/17/2019 1608   PROTEINUR Negative 06/13/2021 1059   PROTEINUR NEGATIVE 10/17/2019 1608   UROBILINOGEN 0.2 06/11/2020 1516   UROBILINOGEN 0.2 08/23/2018 1412   NITRITE Negative 06/13/2021 1059   NITRITE NEGATIVE 10/17/2019 1608   LEUKOCYTESUR Trace (A) 06/13/2021 1059   LEUKOCYTESUR NEGATIVE 10/17/2019 1608   LEUKOCYTESUR Trace 10/08/2014 1745    Pertinent Imaging:   Assessment & Plan: Clinically doing well on urinary prophylaxis.  Reassess in 4 months.  There are no diagnoses linked to this encounter.  No follow-ups on file.  Reece Packer,  MD  [facility] [address], [place], [place] [address] [phone]

## 2021-07-26 ENCOUNTER — Telehealth: Payer: Self-pay | Admitting: Internal Medicine

## 2021-07-26 NOTE — Telephone Encounter (Signed)
Signed and placed in box.   

## 2021-07-26 NOTE — Telephone Encounter (Signed)
Rx from [facility]. Will send last office note with rx once signed.

## 2021-07-26 NOTE — Telephone Encounter (Signed)
The patient is needing an order/ prescription sent over to [facility] for her medical Wanda Martinez. She stated she is needing one for her granddaughter's wedding this weekend.

## 2021-07-27 ENCOUNTER — Other Ambulatory Visit: Payer: Self-pay

## 2021-07-27 ENCOUNTER — Ambulatory Visit: Payer: Medicare Other | Attending: Pain Medicine | Admitting: Pain Medicine

## 2021-07-27 ENCOUNTER — Encounter: Payer: Self-pay | Admitting: Pain Medicine

## 2021-07-27 VITALS — BP 118/62 | HR 51 | Temp 97.1°F | Ht 64.0 in | Wt 179.0 lb

## 2021-07-27 DIAGNOSIS — G894 Chronic pain syndrome: Secondary | ICD-10-CM | POA: Insufficient documentation

## 2021-07-27 DIAGNOSIS — M25561 Pain in right knee: Secondary | ICD-10-CM | POA: Diagnosis not present

## 2021-07-27 DIAGNOSIS — M47816 Spondylosis without myelopathy or radiculopathy, lumbar region: Secondary | ICD-10-CM

## 2021-07-27 DIAGNOSIS — S46001S Unspecified injury of muscle(s) and tendon(s) of the rotator cuff of right shoulder, sequela: Secondary | ICD-10-CM | POA: Insufficient documentation

## 2021-07-27 DIAGNOSIS — Z79891 Long term (current) use of opiate analgesic: Secondary | ICD-10-CM | POA: Diagnosis not present

## 2021-07-27 DIAGNOSIS — M25511 Pain in right shoulder: Secondary | ICD-10-CM

## 2021-07-27 DIAGNOSIS — M1711 Unilateral primary osteoarthritis, right knee: Secondary | ICD-10-CM | POA: Insufficient documentation

## 2021-07-27 DIAGNOSIS — M545 Low back pain, unspecified: Secondary | ICD-10-CM | POA: Diagnosis not present

## 2021-07-27 DIAGNOSIS — M19011 Primary osteoarthritis, right shoulder: Secondary | ICD-10-CM | POA: Diagnosis not present

## 2021-07-27 DIAGNOSIS — Z79899 Other long term (current) drug therapy: Secondary | ICD-10-CM | POA: Insufficient documentation

## 2021-07-27 DIAGNOSIS — M79604 Pain in right leg: Secondary | ICD-10-CM | POA: Diagnosis not present

## 2021-07-27 DIAGNOSIS — G8929 Other chronic pain: Secondary | ICD-10-CM | POA: Diagnosis not present

## 2021-07-27 MED ORDER — HYDROCODONE-ACETAMINOPHEN 5-325 MG PO TABS: 1.0000 | ORAL_TABLET | Freq: Three times a day (TID) | ORAL | 0 refills | Status: DC | PRN

## 2021-07-27 NOTE — Progress Notes (Signed)
Safety precautions to be maintained throughout the outpatient stay will include: orient to surroundings, keep bed in low position, maintain call bell within reach at all times, provide assistance with transfer out of bed and ambulation.  

## 2021-07-27 NOTE — Patient Instructions (Signed)
______________________________________________________________________  Preparing for your procedure (without sedation)  Procedure appointments are limited to planned procedures: No Prescription Refills. No disability issues will be discussed. No medication changes will be discussed.  Instructions: Oral Intake: Do not eat or drink anything for at least 6 hours prior to your procedure. (Exception: Blood Pressure Medication. See below.) Transportation: Unless otherwise stated by your physician, you may drive yourself after the procedure. Blood Pressure Medicine: Do not forget to take your blood pressure medicine with a sip of water the morning of the procedure. If your Diastolic (lower reading)is above 100 mmHg, elective cases will be cancelled/rescheduled. Blood thinners: These will need to be stopped for procedures. Notify our staff if you are taking any blood thinners. Depending on which one you take, there will be specific instructions on how and when to stop it. Diabetics on insulin: Notify the staff so that you can be scheduled 1st case in the morning. If your diabetes requires high dose insulin, take only  of your normal insulin dose the morning of the procedure and notify the staff that you have done so. Preventing infections: Shower with an antibacterial soap the morning of your procedure.  Build-up your immune system: Take 1000 mg of Vitamin C with every meal (3 times a day) the day prior to your procedure. Antibiotics: Inform the staff if you have a condition or reason that requires you to take antibiotics before dental procedures. Pregnancy: If you are pregnant, call and cancel the procedure. Sickness: If you have a cold, fever, or any active infections, call and cancel the procedure. Arrival: You must be in the facility at least 30 minutes prior to your scheduled procedure. Children: Do not bring any children with you. Dress appropriately: Bring dark clothing that you would not mind  if they get stained. Valuables: Do not bring any jewelry or valuables.  Reasons to call and reschedule or cancel your procedure: (Following these recommendations will minimize the risk of a serious complication.) Surgeries: Avoid having procedures within 2 weeks of any surgery. (Avoid for 2 weeks before or after any surgery). Flu Shots: Avoid having procedures within 2 weeks of a flu shots or . (Avoid for 2 weeks before or after immunizations). Barium: Avoid having a procedure within 7-10 days after having had a radiological study involving the use of radiological contrast. (Myelograms, Barium swallow or enema study). Heart attacks: Avoid any elective procedures or surgeries for the initial 6 months after a "Myocardial Infarction" (Heart Attack). Blood thinners: It is imperative that you stop these medications before procedures. Let us know if you if you take any blood thinner.  Infection: Avoid procedures during or within two weeks of an infection (including chest colds or gastrointestinal problems). Symptoms associated with infections include: Localized redness, fever, chills, night sweats or profuse sweating, burning sensation when voiding, cough, congestion, stuffiness, runny nose, sore throat, diarrhea, nausea, vomiting, cold or Flu symptoms, recent or current infections. It is specially important if the infection is over the area that we intend to treat. Heart and lung problems: Symptoms that may suggest an active cardiopulmonary problem include: cough, chest pain, breathing difficulties or shortness of breath, dizziness, ankle swelling, uncontrolled high or unusually low blood pressure, and/or palpitations. If you are experiencing any of these symptoms, cancel your procedure and contact your primary care physician for an evaluation.  Remember:  Regular Business hours are:  Monday to Thursday 8:00 AM to 4:00 PM  Provider's Schedule: Dorianne Perret, MD:  Procedure days: Tuesday and Thursday    7:30 AM to 4:00 PM  Bilal Lateef, MD:  Procedure days: Monday and Wednesday 7:30 AM to 4:00 PM ______________________________________________________________________  ____________________________________________________________________________________________  General Risks and Possible Complications  Patient Responsibilities: It is important that you read this as it is part of your informed consent. It is our duty to inform you of the risks and possible complications associated with treatments offered to you. It is your responsibility as a patient to read this and to ask questions about anything that is not clear or that you believe was not covered in this document.  Patient's Rights: You have the right to refuse treatment. You also have the right to change your mind, even after initially having agreed to have the treatment done. However, under this last option, if you wait until the last second to change your mind, you may be charged for the materials used up to that point.  Introduction: Medicine is not an exact science. Everything in Medicine, including the lack of treatment(s), carries the potential for danger, harm, or loss (which is by definition: Risk). In Medicine, a complication is a secondary problem, condition, or disease that can aggravate an already existing one. All treatments carry the risk of possible complications. The fact that a side effects or complications occurs, does not imply that the treatment was conducted incorrectly. It must be clearly understood that these can happen even when everything is done following the highest safety standards.  No treatment: You can choose not to proceed with the proposed treatment alternative. The "PRO(s)" would include: avoiding the risk of complications associated with the therapy. The "CON(s)" would include: not getting any of the treatment benefits. These benefits fall under one of three categories: diagnostic; therapeutic; and/or palliative.  Diagnostic benefits include: getting information which can ultimately lead to improvement of the disease or symptom(s). Therapeutic benefits are those associated with the successful treatment of the disease. Finally, palliative benefits are those related to the decrease of the primary symptoms, without necessarily curing the condition (example: decreasing the pain from a flare-up of a chronic condition, such as incurable terminal cancer).  General Risks and Complications: These are associated to most interventional treatments. They can occur alone, or in combination. They fall under one of the following six (6) categories: no benefit or worsening of symptoms; bleeding; infection; nerve damage; allergic reactions; and/or death. No benefits or worsening of symptoms: In Medicine there are no guarantees, only probabilities. No healthcare provider can ever guarantee that a medical treatment will work, they can only state the probability that it may. Furthermore, there is always the possibility that the condition may worsen, either directly, or indirectly, as a consequence of the treatment. Bleeding: This is more common if the patient is taking a blood thinner, either prescription or over the counter (example: Goody Powders, Fish oil, Aspirin, Garlic, etc.), or if suffering a condition associated with impaired coagulation (example: Hemophilia, cirrhosis of the liver, low platelet counts, etc.). However, even if you do not have one on these, it can still happen. If you have any of these conditions, or take one of these drugs, make sure to notify your treating physician. Infection: This is more common in patients with a compromised immune system, either due to disease (example: diabetes, cancer, human immunodeficiency virus [HIV], etc.), or due to medications or treatments (example: therapies used to treat cancer and rheumatological diseases). However, even if you do not have one on these, it can still happen. If you  have any of these conditions, or take   one of these drugs, make sure to notify your treating physician. Nerve Damage: This is more common when the treatment is an invasive one, but it can also happen with the use of medications, such as those used in the treatment of cancer. The damage can occur to small secondary nerves, or to large primary ones, such as those in the spinal cord and brain. This damage may be temporary or permanent and it may lead to impairments that can range from temporary numbness to permanent paralysis and/or brain death. Allergic Reactions: Any time a substance or material comes in contact with our body, there is the possibility of an allergic reaction. These can range from a mild skin rash (contact dermatitis) to a severe systemic reaction (anaphylactic reaction), which can result in death. Death: In general, any medical intervention can result in death, most of the time due to an unforeseen complication. ____________________________________________________________________________________________ ____________________________________________________________________________________________  Medication Rules  Purpose: To inform patients, and their family members, of our rules and regulations.  Applies to: All patients receiving prescriptions (written or electronic).  Pharmacy of record: Pharmacy where electronic prescriptions will be sent. If written prescriptions are taken to a different pharmacy, please inform the nursing staff. The pharmacy listed in the electronic medical record should be the one where you would like electronic prescriptions to be sent.  Electronic prescriptions: In compliance with the [place] (STOP) Act of 2017 (Session Lanny Cramp [phone]), effective December 18, 2018, all controlled substances must be electronically prescribed. Calling prescriptions to the pharmacy will cease to exist.  Prescription refills: Only during  scheduled appointments. Applies to all prescriptions.  NOTE: The following applies primarily to controlled substances (Opioid* Pain Medications).   Type of encounter (visit): For patients receiving controlled substances, face-to-face visits are required. (Not an option or up to the patient.)  Patient's responsibilities: Pain Pills: Bring all pain pills to every appointment (except for procedure appointments). Pill Bottles: Bring pills in original pharmacy bottle. Always bring the newest bottle. Bring bottle, even if empty. Medication refills: You are responsible for knowing and keeping track of what medications you take and those you need refilled. The day before your appointment: write a list of all prescriptions that need to be refilled. The day of the appointment: give the list to the admitting nurse. Prescriptions will be written only during appointments. No prescriptions will be written on procedure days. If you forget a medication: it will not be "Called in", "Faxed", or "electronically sent". You will need to get another appointment to get these prescribed. No early refills. Do not call asking to have your prescription filled early. Prescription Accuracy: You are responsible for carefully inspecting your prescriptions before leaving our office. Have the discharge nurse carefully go over each prescription with you, before taking them home. Make sure that your name is accurately spelled, that your address is correct. Check the name and dose of your medication to make sure it is accurate. Check the number of pills, and the written instructions to make sure they are clear and accurate. Make sure that you are given enough medication to last until your next medication refill appointment. Taking Medication: Take medication as prescribed. When it comes to controlled substances, taking less pills or less frequently than prescribed is permitted and encouraged. Never take more pills than  instructed. Never take medication more frequently than prescribed.  Inform other Doctors: Always inform, all of your healthcare providers, of all the medications you take. Pain Medication from other Providers:  You are not allowed to accept any additional pain medication from any other Doctor or Healthcare provider. There are two exceptions to this rule. (see below) In the event that you require additional pain medication, you are responsible for notifying us, as stated below. Cough Medicine: Often these contain an opioid, such as codeine or hydrocodone. Never accept or take cough medicine containing these opioids if you are already taking an opioid* medication. The combination may cause respiratory failure and death. Medication Agreement: You are responsible for carefully reading and following our Medication Agreement. This must be signed before receiving any prescriptions from our practice. Safely store a copy of your signed Agreement. Violations to the Agreement will result in no further prescriptions. (Additional copies of our Medication Agreement are available upon request.) Laws, Rules, & Regulations: All patients are expected to follow all Federal and [employer], [employer], Rules, [employer] [facility]. Ignorance of the Laws does not constitute a valid excuse.  Illegal drugs and Controlled Substances: The use of illegal substances (including, but not limited to marijuana and its derivatives) and/or the illegal use of any controlled substances is strictly prohibited. Violation of this rule may result in the immediate and permanent discontinuation of any and all prescriptions being written by our practice. The use of any illegal substances is prohibited. Adopted CDC guidelines & recommendations: Target dosing levels will be at or below 60 MME/day. Use of benzodiazepines** is not recommended.  Exceptions: There are only two exceptions to the rule of not receiving pain medications from other Healthcare  Providers. Exception #1 (Emergencies): In the event of an emergency (i.e.: accident requiring emergency care), you are allowed to receive additional pain medication. However, you are responsible for: As soon as you are able, call our office [phone], at any time of the day or night, and leave a message stating your name, the date and nature of the emergency, and the name and dose of the medication prescribed. In the event that your call is answered by a member of our staff, make sure to document and save the date, time, and the name of the person that took your information.  Exception #2 (Planned Surgery): In the event that you are scheduled by another doctor or dentist to have any type of surgery or procedure, you are allowed (for a period no longer than 30 days), to receive additional pain medication, for the acute post-op pain. However, in this case, you are responsible for picking up a copy of our "Post-op Pain Management for Surgeons" handout, and giving it to your surgeon or dentist. This document is available at our office, and does not require an appointment to obtain it. Simply go to our office during business hours (Monday-Thursday from 8:00 AM to 4:00 PM) (Friday 8:00 AM to 12:00 Noon) or if you have a scheduled appointment with [place], prior to your surgery, and ask for it by name. In addition, you are responsible for: calling our office [phone], at any time of the day or night, and leaving a message stating your name, name of your surgeon, type of surgery, and date of procedure or surgery. Failure to comply with your responsibilities may result in termination of therapy involving the controlled substances.  *Opioid medications include: morphine, codeine, oxycodone, oxymorphone, hydrocodone, hydromorphone, meperidine, tramadol, tapentadol, buprenorphine, fentanyl, methadone. **Benzodiazepine medications include: diazepam (Valium), alprazolam (Xanax), clonazepam (Klonopine), lorazepam  (Ativan), clorazepate (Tranxene), chlordiazepoxide (Librium), estazolam (Prosom), oxazepam (Serax), temazepam (Restoril), triazolam (Halcion) (Last updated: 11/15/2020) ____________________________________________________________________________________________  ____________________________________________________________________________________________  Medication Recommendations  and Reminders  Applies to: All patients receiving prescriptions (written and/or electronic).  Medication Rules & Regulations: These rules and regulations exist for your safety and that of others. They are not flexible and neither are we. Dismissing or ignoring them will be considered "non-compliance" with medication therapy, resulting in complete and irreversible termination of such therapy. (See document titled "Medication Rules" for more details.) In all conscience, because of safety reasons, we cannot continue providing a therapy where the patient does not follow instructions.  Pharmacy of record:  Definition: This is the pharmacy where your electronic prescriptions will be sent.  We do not endorse any particular pharmacy, however, we have experienced problems with [facility] not securing enough medication supply for the community. We do not restrict you in your choice of pharmacy. However, once we write for your prescriptions, we will NOT be re-sending more prescriptions to fix restricted supply problems created by your pharmacy, or your insurance.  The pharmacy listed in the electronic medical record should be the one where you want electronic prescriptions to be sent. If you choose to change pharmacy, simply notify our nursing staff.  Recommendations: Keep all of your pain medications in a safe place, under lock and key, even if you live alone. We will NOT replace lost, stolen, or damaged medication. After you fill your prescription, take 1 week's worth of pills and put them away in a safe place. You should keep a  separate, properly labeled bottle for this purpose. The remainder should be kept in the original bottle. Use this as your primary supply, until it runs out. Once it's gone, then you know that you have 1 week's worth of medicine, and it is time to come in for a prescription refill. If you do this correctly, it is unlikely that you will ever run out of medicine. To make sure that the above recommendation works, it is very important that you make sure your medication refill appointments are scheduled at least 1 week before you run out of medicine. To do this in an effective manner, make sure that you do not leave the office without scheduling your next medication management appointment. Always ask the nursing staff to show you in your prescription , when your medication will be running out. Then arrange for the receptionist to get you a return appointment, at least 7 days before you run out of medicine. Do not wait until you have 1 or 2 pills left, to come in. This is very poor planning and does not take into consideration that we may need to cancel appointments due to bad weather, sickness, or emergencies affecting our staff. DO NOT ACCEPT A "Partial Fill": If for any reason your pharmacy does not have enough pills/tablets to completely fill or refill your prescription, do not allow for a "partial fill". The law allows the pharmacy to complete that prescription within 72 hours, without requiring a new prescription. If they do not fill the rest of your prescription within those 72 hours, you will need a separate prescription to fill the remaining amount, which we will NOT provide. If the reason for the partial fill is your insurance, you will need to talk to the pharmacist about payment alternatives for the remaining tablets, but again, DO NOT ACCEPT A PARTIAL FILL, unless you can trust your pharmacist to obtain the remainder of the pills within 72 hours.  Prescription refills and/or changes in medication(s):   Prescription refills, and/or changes in dose or medication, will be conducted only during  scheduled medication management appointments. (Applies to both, written and electronic prescriptions.) No refills on procedure days. No medication will be changed or started on procedure days. No changes, adjustments, and/or refills will be conducted on a procedure day. Doing so will interfere with the diagnostic portion of the procedure. No phone refills. No medications will be "called into the pharmacy". No Fax refills. No weekend refills. No Holliday refills. No after hours refills.  Remember:  Business hours are:  Monday to Thursday 8:00 AM to 4:00 PM Provider's Schedule: Milinda Pointer, MD - Appointments are:  Medication management: Monday and Wednesday 8:00 AM to 4:00 PM Procedure day: Tuesday and Thursday 7:30 AM to 4:00 PM Gillis Santa, MD - Appointments are:  Medication management: Tuesday and Thursday 8:00 AM to 4:00 PM Procedure day: Monday and Wednesday 7:30 AM to 4:00 PM (Last update: 07/07/2020) ____________________________________________________________________________________________  ____________________________________________________________________________________________  CBD (cannabidiol) WARNING  Applicable to: All individuals currently taking or considering taking CBD (cannabidiol) and, more important, all patients taking opioid analgesic controlled substances (pain medication). (Example: oxycodone; oxymorphone; hydrocodone; hydromorphone; morphine; methadone; tramadol; tapentadol; fentanyl; buprenorphine; butorphanol; dextromethorphan; meperidine; codeine; etc.)  Legal status: CBD remains a Schedule I drug prohibited for any use. CBD is illegal with one exception. In the [place], CBD has a limited Transport planner (FDA) approval for the treatment of two specific types of epilepsy disorders. Only one CBD product has been approved by the FDA for this  purpose: "Epidiolex". FDA is aware that some companies are marketing products containing cannabis and cannabis-derived compounds in ways that violate the [employer], Drug and Cosmetic Act [facility] Act) and that may put the health and safety of consumers at risk. The FDA, a Federal agency, has not enforced the CBD status since 2018.   Legality: Some manufacturers ship CBD products nationally, which is illegal. Often such products are sold online and are therefore available throughout the country. CBD is openly sold in head shops and health food stores in some states where such sales have not been explicitly legalized. Selling unapproved products with unsubstantiated therapeutic claims is not only a violation of the law, but also can put patients at risk, as these products have not been proven to be safe or effective. Federal illegality makes it difficult to conduct research on CBD.  Reference: "FDA Regulation of Cannabis and Cannabis-Derived Products, Including Cannabidiol (CBD)" - [employer]  Warning: CBD is not FDA approved and has not undergo the same manufacturing controls as prescription drugs.  This means that the purity and safety of available CBD may be questionable. Most of the time, despite manufacturer's claims, it is contaminated with THC (delta-9-tetrahydrocannabinol - the chemical in marijuana responsible for the "HIGH").  When this is the case, the [facility] contaminant will trigger a positive urine drug screen (UDS) test for Marijuana (carboxy-THC). Because a positive UDS for any illicit substance is a violation of our medication agreement, your opioid analgesics (pain medicine) may be permanently discontinued.  MORE ABOUT CBD  General Information: CBD  is a derivative of the Marijuana (cannabis sativa) plant discovered in 31. It is one of the 113 identified substances found in  Marijuana. It accounts for up to 40% of the plant's extract. As of 2018, preliminary clinical studies on CBD included research for the treatment of anxiety, movement disorders, and pain. CBD is available and consumed in multiple forms, including inhalation of smoke or vapor, as an aerosol spray, and by mouth. It may be supplied as an oil containing CBD,  capsules, dried cannabis, or as a liquid solution. CBD is thought not to be as psychoactive as THC (delta-9-tetrahydrocannabinol - the chemical in marijuana responsible for the "HIGH"). Studies suggest that CBD may interact with different biological target receptors in the body, including cannabinoid and other neurotransmitter receptors. As of 2018 the mechanism of action for its biological effects has not been determined.  Side-effects  Adverse reactions: Dry mouth, diarrhea, decreased appetite, fatigue, drowsiness, malaise, weakness, sleep disturbances, and others.  Drug interactions: CBC may interact with other medications such as blood-thinners. (Last update: 07/24/2020) ____________________________________________________________________________________________  ____________________________________________________________________________________________  Drug Holidays (Slow)  What is a "Drug Holiday"? Drug Holiday: is the name given to the period of time during which a patient stops taking a medication(s) for the purpose of eliminating tolerance to the drug.  Benefits Improved effectiveness of opioids. Decreased opioid dose needed to achieve benefits. Improved pain with lesser dose.  What is tolerance? Tolerance: is the progressive decreased in effectiveness of a drug due to its repetitive use. With repetitive use, the body gets use to the medication and as a consequence, it loses its effectiveness. This is a common problem seen with opioid pain medications. As a result, a larger dose of the drug is needed to achieve the same effect that  used to be obtained with a smaller dose.  How long should a "Drug Holiday" last? You should stay off of the pain medicine for at least 14 consecutive days. (2 weeks)  Should I stop the medicine "cold [place]"? No. You should always coordinate with your Pain Specialist so that he/she can provide you with the correct medication dose to make the transition as smoothly as possible.  How do I stop the medicine? Slowly. You will be instructed to decrease the daily amount of pills that you take by one (1) pill every seven (7) days. This is called a "slow downward taper" of your dose. For example: if you normally take four (4) pills per day, you will be asked to drop this dose to three (3) pills per day for seven (7) days, then to two (2) pills per day for seven (7) days, then to one (1) per day for seven (7) days, and at the end of those last seven (7) days, this is when the "Drug Holiday" would start.   Will I have withdrawals? By doing a "slow downward taper" like this one, it is unlikely that you will experience any significant withdrawal symptoms. Typically, what triggers withdrawals is the sudden stop of a high dose opioid therapy. Withdrawals can usually be avoided by slowly decreasing the dose over a prolonged period of time. If you do not follow these instructions and decide to stop your medication abruptly, withdrawals may be possible.  What are withdrawals? Withdrawals: refers to the wide range of symptoms that occur after stopping or dramatically reducing opiate drugs after heavy and prolonged use. Withdrawal symptoms do not occur to patients that use low dose opioids, or those who take the medication sporadically. Contrary to benzodiazepine (example: Valium, Xanax, etc.) or alcohol withdrawals ("Delirium Tremens"), opioid withdrawals are not lethal. Withdrawals are the physical manifestation of the body getting rid of the excess receptors.  Expected Symptoms Early symptoms of withdrawal may  include: Agitation Anxiety Muscle aches Increased tearing Insomnia Runny nose Sweating Yawning  Late symptoms of withdrawal may include: Abdominal cramping Diarrhea Dilated pupils Goose bumps Nausea Vomiting  Will I experience withdrawals? Due to the slow nature of the taper, it is very unlikely  that you will experience any.  What is a slow taper? Taper: refers to the gradual decrease in dose.  (Last update: 07/07/2020) ____________________________________________________________________________________________

## 2021-07-27 NOTE — Progress Notes (Signed)
PROVIDER NOTE: Information contained herein reflects review and annotations entered in association with encounter. Interpretation of such information and data should be left to medically-trained personnel. Information provided to patient can be located elsewhere in the medical record under "Patient Instructions". Document created using STT-dictation technology, any transcriptional errors that may result from process are unintentional.    Patient: Wanda Martinez  Service Category: E/M  Provider: Gaspar Cola, MD  DOB: 16-Oct-1935  DOS: 07/27/2021  Specialty: Interventional Pain Management  MRN: [id number]  Setting: Ambulatory outpatient  PCP: Wanda Pheasant, MD  Type: Established Patient    Referring Provider: Einar Pheasant, MD  Location: Office  Delivery: Face-to-face     HPI  [name], a 86 y.o. year old female, is here today because of her Facet syndrome, lumbar [M47.816]. [name] primary complain today is Back Pain Last encounter: My last encounter with her was on 07/05/2021. Pertinent problems: [name] has Primary cancer of upper inner quadrant of right female breast ([place]); Headache; Parkinson's disease ([place]); Chronic low back pain (1ry area of Pain) (Bilateral) (R>L) w/o sciatica; Lumbar spondylosis; Chronic hip pain (Bilateral); Chronic neck pain; Cervical spondylosis; Chronic cervical radicular pain (Right); Diffuse myofascial pain syndrome; Neurogenic pain; Chronic upper back pain (Right); Myofascial pain syndrome (Right) (cervicothoracic); Lumbar facet syndrome (Bilateral) (R>L); Malignant neoplasm of right female breast ([place]); Cervical facet hypertrophy; Cervical facet syndrome ([place]); Chronic shoulder pain (Right); Chronic pain syndrome; Chronic sacroiliac joint pain (Left); Chronic sacroiliac joint pain (Right); 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, L4-5, L5-S1) (Right); Lumbar spinal stenosis (with neurogenic claudication) (L3-4); Chronic lower extremity pain  (2ry area of Pain) (Right); Chronic lumbar radicular pain (S1) (Right); Trochanteric bursitis of hip (Bilateral); Spondylosis without myelopathy or radiculopathy, lumbosacral region; Trigger point with back pain (Right); DDD (degenerative disc disease), lumbosacral; Chronic upper extremity pain (Right); Chronic thoracic back pain (Bilateral) (L>R); Trigger point of thoracic region (Bilateral) (L>R); Other specified dorsopathies, sacral and sacrococcygeal region; Sacroiliac joint dysfunction (Right); Osteoarthritis of sacroiliac joint (Right); Somatic dysfunction of sacroiliac joint (Right); Chronic musculoskeletal pain; Facial pain; Chronic hip pain (Right); Osteoarthritis of hip (Right); Left ear pain; 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 Pain Assessment: Severity of Chronic pain is reported as a 10-Worst pain ever/10. Location: Back Right, Left, Lower/pain radiaties down the back of her right leg. Onset: More than a month ago. Quality: Aching, Burning, Throbbing. Timing: Constant. Modifying factor(s): rest and meds. Vitals:  height is _0  (1.626 m) and weight is 179 lb (81.2 kg). Her temperature is 97.1 F (36.2 C) (abnormal). Her blood pressure is 118/62 and her pulse is 51 (abnormal). Her oxygen saturation is 100%.   Reason for encounter: both, medication management and post-procedure assessment.   The patient indicates doing well with the current medication regimen. No adverse reactions or side  effects reported to the medications.   The patient refers that the right shoulder is doing much better after the shoulder injection done on her last visit.  As to the knee, she refers that she still having some pain but I am beginning to think that she probably has a component of  the knee pain that is coming from the joint proper and some is from a radicular component.  Today she indicates having a flareup of her low back pain, bilaterally, with the right side being worse than the left.  Her secondary pain today is that of the lower extremity where she indicates that the pain runs down the back of the leg all the way down into the lateral aspect of her right foot and what appears to be an S1 dermatomal distribution.  She also has pain on her right knee and she comes into the clinics today on a wheelchair since she is having problems ambulating.  The patient indicates that her last treatment for her low back pain did improve it significantly and she would like to have that repeated.  She is referring to a bilateral lumbar facet block done on 03/15/2021 which has provided her with excellent relief of her pain for close to 4 months.  In the past we have talked to her about the possibility of doing radiofrequency ablation, but she has been a little reluctant about this.  She is not have any hardware in her back and therefore there are no immediate contraindications to that radiofrequency ablation.  However, she wants to have the procedure as soon as possible because of an upcoming trip to her grand daughters wedding.  We will try to accommodate her as much as we can.  RTCB: 11/27/2021 Nonopioids transfer 12/21/2020: Gabapentin  Post-Procedure Evaluation  Procedure (06/07/2021):  Type: Therapeutic Intra-Articular Monovisc Knee Injection #1  Region: Lateral infrapatellar Knee Region Level: Knee Joint Laterality: Right knee  Type: Diagnostic Glenohumeral Joint (shoulder) Injection          Primary Purpose:  Diagnostic Region: Superior Shoulder Area Level:  Shoulder Target Area: Glenohumeral Joint (shoulder) Approach: Anterior approach. Laterality: Right  Pre-procedure pain level: 4/10 Post-procedure: 1/10 (> 50% relief)  Anxiolysis: Minimal conscious anxiolysis  Effectiveness during initial hour after procedure (Ultra-Short Term Relief): 100 % (100 for shoulder 100 for knee).  Local anesthetic used: Long-acting (4-6 hours) Effectiveness: Defined as any analgesic benefit obtained secondary to the administration of local anesthetics. This carries significant diagnostic value as to the etiological location, or anatomical origin, of the pain. Duration of benefit is expected to coincide with the duration of the local anesthetic used.  Effectiveness during initial 4-6 hours after procedure (Short-Term Relief): 100 % (100 for shoulder 100 for knee).  Long-term benefit: Defined as any relief past the pharmacologic duration of the local anesthetics.  Effectiveness past the initial 6 hours after procedure (Long-Term Relief): 100 % (75 for shoulder 0 for her knee).  Benefits, current: Defined as benefit present at the time of this evaluation.   Analgesia:   The patient refers currently enjoying an ongoing 75% relief of the right shoulder pain but 0% for her knee. Function: Ms. Lehnen reports improvement in function for the shoulder ROM: Ms. Leibensperger reports improvement in ROM for the shoulder  Pharmacotherapy Assessment  Analgesic: Hydrocodone/APAP 5/325, 1 tab PO q 8 hrs (15 mg/day of hydrocodone) MME/day: 15 mg/day.   Monitoring: [place] PMP: PDMP reviewed during this encounter.       Pharmacotherapy: No side-effects or adverse reactions reported. Compliance: No problems identified. Effectiveness: Clinically acceptable.  Chauncey Fischer, RN  07/27/2021 11:25 AM  Sign when Signing Visit Safety precautions to be maintained throughout the outpatient stay will include: orient to surroundings, keep bed in  low position, maintain call bell within reach at all times, provide  assistance with transfer out of bed and ambulation.      UDS:  Summary  Date Value Ref Range Status  06/01/2021 Note  Final    Comment:    ==================================================================== [employer] [facility] (MW) ==================================================================== Test                             Result       Flag       Units  Drug Present and Declared for Prescription Verification   Hydrocodone                    255          EXPECTED   ng/mg creat   Dihydrocodeine                 113          EXPECTED   ng/mg creat   Norhydrocodone                 1235         EXPECTED   ng/mg creat    Sources of hydrocodone include scheduled prescription medications.    Dihydrocodeine and norhydrocodone are expected metabolites of    hydrocodone. Dihydrocodeine is also available as a scheduled    prescription medication.  ==================================================================== Test                      Result    Flag   Units      Ref Range   Creatinine              97               mg/dL      >=20 ==================================================================== Declared Medications:  The flagging and interpretation on this report are based on the  following declared medications.  Unexpected results may arise from  inaccuracies in the declared medications.   **Note: The testing scope of this panel includes these medications:   Hydrocodone (Norco)   **Note: The testing scope of this panel does not include the  following reported medications:   Acetaminophen (Norco)  Amlodipine (Norvasc)  Aspirin  Calcium  Citalopram (Celexa)  Cyclobenzaprine (Flexeril)  Donepezil (Aricept)  Gabapentin (Neurontin)  Glucosamine  Hydrochlorothiazide (Hydrodiuril)  Letrozole (Femara)  Losartan (Cozaar)  Lubiprostone (Amitiza)  Magnesium (Mag-Ox)  Melatonin  Meloxicam (Mobic)   Metoprolol (Toprol)  Multivitamin  Omeprazole (Prilosec)  Pantoprazole (Protonix)  Pravastatin (Pravachol) ==================================================================== For clinical consultation, please call [phone]. ====================================================================      ROS  Constitutional: Denies any fever or chills Gastrointestinal: No reported hemesis, hematochezia, vomiting, or acute GI distress Musculoskeletal: Denies any acute onset joint swelling, redness, loss of ROM, or weakness Neurological: No reported episodes of acute onset apraxia, aphasia, dysarthria, agnosia, amnesia, paralysis, loss of coordination, or loss of consciousness  Medication Review  Glucosamine Sulfate, Glucosamine-Chondroitin, HYDROcodone-acetaminophen, Omega-3, amLODipine, aspirin, calcium carbonate, citalopram, cyclobenzaprine, donepezil, gabapentin, hydrochlorothiazide, letrozole, losartan, lubiprostone, magnesium oxide, melatonin, meloxicam, metoprolol succinate, multivitamin, nitrofurantoin (macrocrystal-monohydrate), omeprazole, and pantoprazole  History Review  Allergy: Ms. Naser is allergic to sulfa antibiotics and vesicare [solifenacin]. Drug: Ms. Uplinger  reports no history of drug use. Alcohol:  reports no history of alcohol use. Tobacco:  reports that she has never smoked. She has never used smokeless tobacco. Social: Ms. Idris  reports that she has never smoked. She has never used smokeless tobacco. She reports that she does not  drink alcohol and does not use drugs. Medical:  has a past medical history of Acute postoperative pain (10/25/2017), Anemia, Arm pain (07/26/2015), Arthritis, Arthritis, degenerative (03/26/2014), Back pain (11/01/2013), Bladder infection (06/2018), Breast cancer ([place]), Breast cancer ([place]), CHEST PAIN (04/29/2010), Chronic cystitis, Cystocele (02/22/2013), Cystocele, midline (08/19/2013), Degeneration of intervertebral disc of lumbosacral region  (03/26/2014), DYSPNEA (04/29/2010), Enthesopathy of hip (03/26/2014), GERD (gastroesophageal reflux disease), Hiatal hernia, HTN (hypertension), Hypokalemia (06/2018), Hyponatremia (06/2018), LBP (low back pain) (03/26/2014), Neck pain (11/01/2013), Parkinson disease ([place]), Pneumonia (06/2018), Sinusitis (02/07/2015), Skin lesions (07/12/2014), and Urinary incontinence. Surgical: Ms. Joynt  has a past surgical history that includes Tonsillectomy and adenoidectomy (81); Vesicovaginal fistula closure w/ TAH (1983); Breast surgery (1986); Breast enhancement surgery (1987); Breast implant removal; Breast implant removal (Right, 08/29/2012); Mastectomy (08/2012); Abdominal hysterectomy; Colonoscopy with propofol (N/A, 09/13/2016); and Breast biopsy (2013). Family: family history includes Colon polyps in her father; Diabetes in her father; Parkinson's disease in her mother; Stroke in her father and mother.  Laboratory Chemistry Profile   Renal Lab Results  Component Value Date   BUN 17 03/17/2021   CREATININE 0.91 03/17/2021   BCR 20 06/11/2020   GFR 58.38 (L) 03/17/2021   GFRAA >60 07/27/2020   GFRNONAA 47 (L) 01/27/2021    Hepatic Lab Results  Component Value Date   AST 24 03/17/2021   ALT 16 03/17/2021   ALBUMIN 4.5 03/17/2021   ALKPHOS 23 (L) 03/17/2021    Electrolytes Lab Results  Component Value Date   NA 135 03/17/2021   K 3.8 03/17/2021   CL 99 03/17/2021   CALCIUM 9.4 03/17/2021   MG 2.2 07/17/2018    Bone Lab Results  Component Value Date   25OHVITD1 42 07/17/2018   25OHVITD2 <1.0 07/17/2018   25OHVITD3 42 07/17/2018    Inflammation (CRP: Acute Phase) (ESR: Chronic Phase) Lab Results  Component Value Date   CRP 3 07/17/2018   ESRSEDRATE 27 07/17/2018         Note: Above Lab results reviewed.  Recent Imaging Review  Ultrasound renal complete CLINICAL DATA:  Recurrent UTI  EXAM: RENAL / URINARY TRACT ULTRASOUND COMPLETE  COMPARISON:  None.  FINDINGS: Right  Kidney:  Renal measurements: 10.0 x 4.3 x 4.2 cm = volume: 96 mL. Echogenicity within normal limits. No mass or hydronephrosis visualized.  Left Kidney:  Renal measurements: 9.6 x 4.5 x 4.7 cm = volume: 104 mL. Increased cortical echogenicity.  Bladder:  Appears normal for degree of bladder distention.  Other:  None.  IMPRESSION: 1. Mild increased cortical echogenicity associated with the left kidney, a nonspecific finding. 2. No other abnormalities.  Electronically Signed   By: Dorise Bullion III M.D   On: 07/22/2021 11:31 Note: Reviewed        Physical Exam  General appearance: Well nourished, well developed, and well hydrated. In no apparent acute distress Mental status: Alert, oriented x 3 (person, place, & time)       Respiratory: No evidence of acute respiratory distress Eyes: PERLA Vitals: BP 118/62   Pulse (!) 51   Temp (!) 97.1 F (36.2 C)   Ht _0  (1.626 m)   Wt 179 lb (81.2 kg)   LMP 12/18/1981   SpO2 100%   BMI 30.73 kg/m  BMI: Estimated body mass index is 30.73 kg/m as calculated from the following:   Height as of this encounter: _1  (1.626 m).   Weight as of this encounter: 179 lb (81.2 kg). Ideal: Ideal body weight: 54.7  kg (120 lb 9.5 oz) Adjusted ideal body weight: 65.3 kg (143 lb 15.3 oz)  Assessment   Status Diagnosis  Controlled Controlled Controlled 1. Lumbar facet syndrome (Bilateral) (R>L)   2. Chronic shoulder pain (Right)   3. Osteoarthritis of AC (acromioclavicular) joint (Right)   4. Osteoarthritis of shoulder (Right)   5. Unspecified injury of muscle(s) and tendon(s) of the rotator cuff of shoulder, sequela (Right)   6. Osteoarthritis of knee (Right)   7. Chronic knee pain (Right)   8. Chronic low back pain (1ry area of Pain) (Bilateral) (R>L) w/o sciatica   9. Chronic lower extremity pain (2ry area of Pain) (Right)   10. Chronic pain syndrome   11. Pharmacologic therapy   12. Chronic use of opiate for therapeutic  purpose   13. Encounter for chronic pain management      Updated Problems: No problems updated.  Plan of Care  Problem-specific:  No problem-specific Assessment & Plan notes found for this encounter.  Ms. Eilyn Polack has a current medication list which includes the following long-term medication(s): amlodipine, calcium carbonate, cyclobenzaprine, donepezil, gabapentin, hydrochlorothiazide, [START ON 07/30/2021] hydrocodone-acetaminophen, losartan, magnesium oxide, meloxicam, metoprolol succinate, omeprazole, pantoprazole, [START ON 08/29/2021] hydrocodone-acetaminophen, [START ON 09/28/2021] hydrocodone-acetaminophen, and [START ON 10/28/2021] hydrocodone-acetaminophen.  Pharmacotherapy (Medications Ordered): Meds ordered this encounter  Medications   HYDROcodone-acetaminophen (NORCO/VICODIN) 5-325 MG tablet    Sig: Take 1 tablet by mouth every 8 (eight) hours as needed for severe pain. Must last 30 days    Dispense:  90 tablet    Refill:  0    Not a duplicate. Do NOT delete! Dispense 1 day early if closed on refill date. Avoid benzodiazepines within 8 hours of opioids. Do not send refill requests.   HYDROcodone-acetaminophen (NORCO/VICODIN) 5-325 MG tablet    Sig: Take 1 tablet by mouth every 8 (eight) hours as needed for severe pain. Must last 30 days    Dispense:  90 tablet    Refill:  0    Not a duplicate. Do NOT delete! Dispense 1 day early if closed on refill date. Avoid benzodiazepines within 8 hours of opioids. Do not send refill requests.   HYDROcodone-acetaminophen (NORCO/VICODIN) 5-325 MG tablet    Sig: Take 1 tablet by mouth every 8 (eight) hours as needed for severe pain. Must last 30 days    Dispense:  90 tablet    Refill:  0    Not a duplicate. Do NOT delete! Dispense 1 day early if closed on refill date. Avoid benzodiazepines within 8 hours of opioids. Do not send refill requests.    Orders:  Orders Placed This Encounter  Procedures   LUMBAR FACET(MEDIAL  BRANCH NERVE BLOCK) MBNB    Standing Status:   Future    Standing Expiration Date:   08/27/2021    Scheduling Instructions:     Procedure: Lumbar facet block (AKA.: Lumbosacral medial branch nerve block)     Side: Bilateral     Level: L3-4, L4-5, & L5-S1 Facets (L2, L3, L4, L5, & S1 Medial Branch Nerves)     Sedation: No Sedation.     Timeframe: ASAA    Order Specific Question:   Where will this procedure be performed?    Answer:   [facility]   Informed Consent Details: Physician/Practitioner Attestation; Transcribe to consent form and obtain patient signature    Nursing Order: Transcribe to consent form and obtain patient signature. Note: Always confirm laterality of pain with Ms. Ermalinda Barrios, before procedure.  Order Specific Question:   Physician/Practitioner attestation of informed consent for procedure/surgical case    Answer:   I, the physician/practitioner, attest that I have discussed with the patient the benefits, risks, side effects, alternatives, likelihood of achieving goals and potential problems during recovery for the procedure that I have provided informed consent.    Order Specific Question:   Procedure    Answer:   Lumbar Facet Block  under fluoroscopic guidance    Order Specific Question:   Physician/Practitioner performing the procedure    Answer:   Tyton Abdallah A. Dossie Arbour MD    Order Specific Question:   Indication/Reason    Answer:   Low Back Pain, with our without leg pain, due to Facet Joint Arthralgia (Joint Pain) Spondylosis (Arthritis of the Spine), without myelopathy or radiculopathy (Nerve Damage).    Follow-up plan:   Return in about 4 months (around 11/27/2021) for (M,W) (F2F) (MM), in addition, (no sedation) procedure: (B) L-FCT BLK.     Interventional Therapies  Risk  Complexity Considerations:   Estimated body mass index is 31.41 kg/m as calculated from the following:   Height as of this encounter: _0  (1.626 m).   Weight as of this encounter:  183 lb (83 kg). WNL   Planned  Pending:   Palliative bilateral lumbar facet block    Under consideration:   Therapeutic bilateral lumbar facet RFA  Diagnostic right genicular nerve blocks  Therapeutic right genicular nerve RFA  Diagnostic bilateral cervical facet block  Possible bilateral cervical facet RFA    Completed:   Therapeutic right superior/lateral trapezius muscle TPI x1 (04/19/2021)  Therapeutic right thoracic back TPI x2 (10/17/2018)  Therapeutic left thoracic back TPI x1 (10/17/2018) Therapeutic right quadratus lumborum and erector spinae muscle TPI x1 (02/12/2018) Therapeutic right lumbar facet block x8 (03/15/2021)  Therapeutic left lumbar facet block x4 (03/15/2021)  Therapeutic right lumbar facet RFA x2 (06/04/2018) (100/90/75/75-100) Therapeutic left lumbar facet RFA x1 (02/12/2018) (100/60/90/>75) Diagnostic right SI joint block x3 (12/21/2020)  Diagnostic left SI joint block x1 (08/09/2017)  Therapeutic right SI joint RFA x3 (06/05/2019)  Therapeutic midline L2-3 LESI x1 (10/04/2017) (100/100/85/>50) Therapeutic right L3-4 LESI x2 (11/04/2020) Therapeutic right L4-5 LESI x1 (05/20/2020) Therapeutic right L5-S1 LESI x2 (12/04/2019) Therapeutic right L5 TFESI x3 (12/04/2019)  Therapeutic right S1 TFESI x1 (11/04/2020) Diagnostic bilateral superficial trochanteric bursa injection x1 (04/30/2018)  Diagnostic right IA hip injection x1 (02/13/2019)  Diagnostic/therapeutic right IA knee injection (steroid) x1 (04/19/2021)    Palliative options:   Palliative bilateral lumbar facet RFA  Palliative bilateral lumbar facet blocks       Recent Visits Date Type Provider Dept  06/07/21 Procedure visit Milinda Pointer, MD [facility]  06/01/21 Office Visit Milinda Pointer, MD [facility]  Showing recent visits within past 90 days and meeting all other requirements Today's Visits Date Type Provider Dept  07/27/21 Office Visit Milinda Pointer, MD  [facility]  Showing today's visits and meeting all other requirements Future Appointments Date Type Provider Dept  08/31/21 Appointment Milinda Pointer, MD [facility]  Showing future appointments within next 90 days and meeting all other requirements I discussed the assessment and treatment plan with the patient. The patient was provided an opportunity to ask questions and all were answered. The patient agreed with the plan and demonstrated an understanding of the instructions.  Patient advised to call back or seek an in-person evaluation if the symptoms or condition worsens.  Duration of encounter: 30 minutes.  Note  by: Wanda Cola, MD Date: 07/27/2021; Time: 12:00 PM

## 2021-07-28 ENCOUNTER — Telehealth: Payer: Self-pay | Admitting: Pain Medicine

## 2021-07-28 LAB — CULTURE, URINE COMPREHENSIVE

## 2021-07-28 NOTE — Telephone Encounter (Signed)
Have sent Dr Dossie Arbour a message and asked about the Toradol/Norflex, but I see that the patient has Stage 3 kidney disease, so I do not feelt his is an option.  Awaiting Dr Dossie Arbour response.

## 2021-07-28 NOTE — Telephone Encounter (Signed)
Mrs. Cutler called this morning to check on authorization for procedure. [employer] has not gotten response yet. Could she come in and get toradal / norflex ? Didn't know if this was an option. Have not discussed with patient yet.

## 2021-07-30 NOTE — Progress Notes (Signed)
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  INTERVAL HISTORY: Patient returns to clinic today for routine 24-month evaluation and continuation of Prolia.  She is having increased back pain today and has an appointment in pain clinic next week.  She otherwise feels well and is asymptomatic.  She does not complain of any weakness or fatigue.  She has no new neurologic complaints.  She denies any recent fevers or illnesses.  She has a good appetite and denies weight loss.  She denies any chest pain, shortness of breath, cough, or hemoptysis.  She denies any nausea, vomiting, constipation, or diarrhea.  She has no urinary complaints.  Patient offers no further specific complaints today.  REVIEW OF SYSTEMS:   Review of Systems  Constitutional: Negative.  Negative for fever, malaise/fatigue and weight loss.  Respiratory: Negative.  Negative for cough and shortness of breath.   Cardiovascular: Negative.  Negative for chest pain and leg swelling.  Gastrointestinal: Negative.  Negative for abdominal pain.  Genitourinary: Negative.   Musculoskeletal:  Positive for back pain. Negative for joint pain.  Skin: Negative.  Negative for rash.  Neurological:  Positive for tremors. Negative for sensory change, speech change, focal weakness and weakness.  Psychiatric/Behavioral: Negative.  Negative for depression. The patient is not nervous/anxious.    As per HPI. Otherwise, a complete review of systems is negative.  PAST MEDICAL HISTORY: Past Medical History:  Diagnosis Date   Acute postoperative pain 10/25/2017   Anemia    Arm pain 07/26/2015   Arthritis    Arthritis, degenerative 03/26/2014    Back pain 11/01/2013   Bladder infection 06/2018   Breast cancer ([place])    Masectomy - left - 1986    Breast cancer [facility])    Mastectomy-right -2014   CHEST PAIN 04/29/2010   Qualifier: Diagnosis of  By: Wynetta Emery RN, Erika     Chronic cystitis    Cystocele 02/22/2013   Cystocele, midline 08/19/2013   Degeneration of intervertebral disc of lumbosacral region 03/26/2014   DYSPNEA 04/29/2010   Qualifier: Diagnosis of  By: Wynetta Emery RN, Erika     Enthesopathy of hip 03/26/2014   GERD (gastroesophageal reflux disease)    Hiatal hernia    HTN (hypertension)    Hypokalemia 06/2018   Hyponatremia 06/2018   LBP (low back pain) 03/26/2014   Neck pain 11/01/2013   Parkinson disease ([place])    Pneumonia 06/2018   Sinusitis 02/07/2015   Skin lesions 07/12/2014   Urinary incontinence    mixed     PAST SURGICAL HISTORY: Past Surgical History:  Procedure Laterality Date   ABDOMINAL HYSTERECTOMY     BREAST BIOPSY  2013   BREAST ENHANCEMENT SURGERY  1987   BREAST IMPLANT REMOVAL     BREAST IMPLANT REMOVAL Right 08/29/2012   BREAST SURGERY  1986   s/p left mastectomy   COLONOSCOPY WITH PROPOFOL N/A 09/13/2016   Procedure: COLONOSCOPY WITH PROPOFOL;  Surgeon: Manya Silvas, MD;  Location: [facility]COPY;  Service: Endoscopy;  Laterality: N/A;   MASTECTOMY  08/2012   right   TONSILLECTOMY AND ADENOIDECTOMY  42   VESICOVAGINAL FISTULA CLOSURE W/ TAH  1983    FAMILY HISTORY Family History  Problem Relation Age of Onset  Diabetes Father    Stroke Father    Colon polyps Father    Stroke Mother    Parkinson's disease Mother        ADVANCED DIRECTIVES:    HEALTH MAINTENANCE: Social History   Tobacco Use   Smoking status: Never   Smokeless tobacco: Never   Tobacco comments:    tobacco use - no  Vaping Use   Vaping Use: Never used  Substance Use Topics   Alcohol use: No    Alcohol/week: 0.0 standard drinks   Drug use: No     Allergies  Allergen Reactions   Sulfa Antibiotics Nausea  Only   Vesicare [Solifenacin] Other (See Comments)    Constipation Constipation    Current Outpatient Medications  Medication Sig Dispense Refill   amLODipine (NORVASC) 2.5 MG tablet TAKE 1 TABLET BY MOUTH ONCE DAILY. 30 tablet 11   aspirin 81 MG EC tablet Take 81 mg by mouth daily as needed.     calcium carbonate (OSCAL) 1500 (600 Ca) MG TABS tablet Take 1 tablet by mouth daily.     citalopram (CELEXA) 20 MG tablet TAKE 1 & 1/2 TABLETS BY MOUTH ONCE DAILY     cyclobenzaprine (FLEXERIL) 10 MG tablet TAKE ONE TABLET BY MOUTH AT BEDTIME. 30 tablet 11   donepezil (ARICEPT) 5 MG tablet      gabapentin (NEURONTIN) 300 MG capsule Take by mouth.     GLUCOSAMINE SULFATE PO Take by mouth daily.      Glucosamine-Chondroitin 500-400 MG CAPS Take 2 capsules by mouth daily.     hydrochlorothiazide (HYDRODIURIL) 25 MG tablet TAKE 1 TABLET BY MOUTH ONCE DAILY. (PATIENT TAKES 1/2 ONCE DAILY AND EXTRA 1/2 IF NEEDED) 15 tablet 11   HYDROcodone-acetaminophen (NORCO/VICODIN) 5-325 MG tablet Take 1 tablet by mouth every 8 (eight) hours as needed for severe pain. Must last 30 days 90 tablet 0   [START ON 08/29/2021] HYDROcodone-acetaminophen (NORCO/VICODIN) 5-325 MG tablet Take 1 tablet by mouth every 8 (eight) hours as needed for severe pain. Must last 30 days 90 tablet 0   [START ON 09/28/2021] HYDROcodone-acetaminophen (NORCO/VICODIN) 5-325 MG tablet Take 1 tablet by mouth every 8 (eight) hours as needed for severe pain. Must last 30 days 90 tablet 0   [START ON 10/28/2021] HYDROcodone-acetaminophen (NORCO/VICODIN) 5-325 MG tablet Take 1 tablet by mouth every 8 (eight) hours as needed for severe pain. Must last 30 days 90 tablet 0   letrozole (FEMARA) 2.5 MG tablet Take 2.5 mg by mouth daily.     losartan (COZAAR) 50 MG tablet TAKE (2) TABLETS BY MOUTH ONCE DAILY. 60 tablet 11   lubiprostone (AMITIZA) 24 MCG capsule TAKE 1 CAPSULE BY MOUTH ONCE DAILY WITH BREAKFAST.     magnesium oxide (MAG-OX) 400 (241.3 Mg)  MG tablet TAKE 1 TABLET BY MOUTH ONCE DAILY. 30 tablet 11   melatonin 1 MG TABS tablet Take 3 mg by mouth at bedtime.     meloxicam (MOBIC) 7.5 MG tablet TAKE 1 TABLET BY MOUTH ONCE DAILY. 30 tablet 4   metoprolol succinate (TOPROL-XL) 25 MG 24 hr tablet TAKE 1 TABLET BY MOUTH TWICE DAILY 60 tablet 11   Multiple Vitamin (MULTIVITAMIN) tablet Take 1 tablet by mouth daily.     nitrofurantoin, macrocrystal-monohydrate, (MACROBID) 100 MG capsule Take 1 capsule (100 mg total) by mouth daily. 30 capsule 11   Omega-3 1000 MG CAPS Take by mouth.     omeprazole (PRILOSEC) 20 MG capsule TAKE 1 CAPSULE BY MOUTH TWICE DAILY  FOR REFLUX. 60 capsule 1   pantoprazole (PROTONIX) 40 MG tablet TAKE 1 TABLET BY MOUTH ONCE DAILY. 30 tablet 11   No current facility-administered medications for this visit.    OBJECTIVE: Vitals:   08/03/21 1113  BP: (!) 162/83  Pulse: (!) 53  Resp: 18  Temp: 98 F (36.7 C)     Body mass index is 31.67 kg/m.    ECOG FS:0 - Asymptomatic  General: Well-developed, well-nourished, no acute distress. Eyes: Pink conjunctiva, anicteric sclera. HEENT: Normocephalic, moist mucous membranes. Lungs: No audible wheezing or coughing. Heart: Regular rate and rhythm. Abdomen: Soft, nontender, no obvious distention. Musculoskeletal: No edema, cyanosis, or clubbing. Neuro: Alert, answering all questions appropriately. Cranial nerves grossly intact. Skin: No rashes or petechiae noted. Psych: Normal affect.  LAB RESULTS:  Lab Results  Component Value Date   NA 134 (L) 08/03/2021   K 3.9 08/03/2021   CL 97 (L) 08/03/2021   CO2 30 08/03/2021   GLUCOSE 97 08/03/2021   BUN 17 08/03/2021   CREATININE 0.99 08/03/2021   CALCIUM 9.2 08/03/2021   PROT 7.4 03/17/2021   ALBUMIN 4.5 03/17/2021   AST 24 03/17/2021   ALT 16 03/17/2021   ALKPHOS 23 (L) 03/17/2021   BILITOT 0.4 03/17/2021   GFRNONAA 57 (L) 08/03/2021   GFRAA >60 07/27/2020    Lab Results  Component Value Date   WBC  6.6 03/17/2021   NEUTROABS 4.8 03/17/2021   HGB 11.6 (L) 03/17/2021   HCT 34.4 (L) 03/17/2021   MCV 89.6 03/17/2021   PLT 213.0 03/17/2021     STUDIES: Ultrasound renal complete  Result Date: 07/22/2021 CLINICAL DATA:  Recurrent UTI EXAM: RENAL / URINARY TRACT ULTRASOUND COMPLETE COMPARISON:  None. FINDINGS: Right Kidney: Renal measurements: 10.0 x 4.3 x 4.2 cm = volume: 96 mL. Echogenicity within normal limits. No mass or hydronephrosis visualized. Left Kidney: Renal measurements: 9.6 x 4.5 x 4.7 cm = volume: 104 mL. Increased cortical echogenicity. Bladder: Appears normal for degree of bladder distention. Other: None. IMPRESSION: 1. Mild increased cortical echogenicity associated with the left kidney, a nonspecific finding. 2. No other abnormalities. 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  PLAN:    1.  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  Patient completed 5 years of letrozole in May 2019.  No further interventions are needed.  Patient has had bilateral mastectomies, therefore no further mammograms are necessary.  Return to clinic as scheduled for Prolia. 2.  Osteoporosis: Patient's most recent bone mineral density on July 04, 2021 revealed continuous improvement of her T score at -2.6.  Previously T score was reported at -2.9.  Proceed with Prolia as scheduled today. Continue calcium and vitamin D supplementation.  Return to clinic in 6 months for repeat laboratory work, further evaluation, and continuation of treatment.  Repeat bone mineral density in July 2023. 3.  Tremors/Parkinson's: Chronic and unchanged.  Treatment per neurology.  4.  Back pain: Patient reports she has an appointment in pain clinic next  week.  Patient expressed understanding and was in agreement with this plan. She also understands that She can call clinic at any time with any questions, concerns, or complaints.   Breast cancer [facility])   Staging form: Breast, AJCC  7th Edition     Clinical stage from 09/22/2015: Stage IIA (T1c, N1, M0) - Signed by Lloyd Huger, MD on 09/22/2015   Lloyd Huger, MD   08/03/2021 4:29 PM

## 2021-08-02 ENCOUNTER — Encounter: Payer: Self-pay | Admitting: Oncology

## 2021-08-02 ENCOUNTER — Other Ambulatory Visit: Payer: Self-pay | Admitting: Internal Medicine

## 2021-08-02 DIAGNOSIS — M7918 Myalgia, other site: Secondary | ICD-10-CM

## 2021-08-03 ENCOUNTER — Other Ambulatory Visit: Payer: Medicare Other

## 2021-08-03 ENCOUNTER — Inpatient Hospital Stay: Payer: Medicare Other | Attending: Oncology

## 2021-08-03 ENCOUNTER — Inpatient Hospital Stay: Payer: Medicare Other

## 2021-08-03 ENCOUNTER — Inpatient Hospital Stay: Payer: Medicare Other | Admitting: Oncology

## 2021-08-03 ENCOUNTER — Other Ambulatory Visit: Payer: Self-pay

## 2021-08-03 VITALS — BP 162/83 | HR 53 | Temp 98.0°F | Resp 18 | Wt 184.5 lb

## 2021-08-03 DIAGNOSIS — M810 Age-related osteoporosis without current pathological fracture: Secondary | ICD-10-CM

## 2021-08-03 DIAGNOSIS — C50211 Malignant neoplasm of upper-inner quadrant of right female breast: Secondary | ICD-10-CM | POA: Diagnosis not present

## 2021-08-03 DIAGNOSIS — Z79811 Long term (current) use of aromatase inhibitors: Secondary | ICD-10-CM | POA: Diagnosis not present

## 2021-08-03 DIAGNOSIS — Z79899 Other long term (current) drug therapy: Secondary | ICD-10-CM | POA: Insufficient documentation

## 2021-08-03 DIAGNOSIS — Z9013 Acquired absence of bilateral breasts and nipples: Secondary | ICD-10-CM | POA: Insufficient documentation

## 2021-08-03 DIAGNOSIS — I10 Essential (primary) hypertension: Secondary | ICD-10-CM | POA: Diagnosis not present

## 2021-08-03 DIAGNOSIS — G20 Parkinson's disease: Secondary | ICD-10-CM | POA: Diagnosis not present

## 2021-08-03 DIAGNOSIS — Z170 Estrogen receptor positive status [ER+]: Secondary | ICD-10-CM | POA: Diagnosis not present

## 2021-08-03 LAB — BASIC METABOLIC PANEL
Anion gap: 7 (ref 5–15)
BUN: 17 mg/dL (ref 8–23)
CO2: 30 mmol/L (ref 22–32)
Calcium: 9.2 mg/dL (ref 8.9–10.3)
Chloride: 97 mmol/L — ABNORMAL LOW (ref 98–111)
Creatinine, Ser: 0.99 mg/dL (ref 0.44–1.00)
GFR, Estimated: 57 mL/min — ABNORMAL LOW (ref >60)
Glucose, Bld: 97 mg/dL (ref 70–99)
Potassium: 3.9 mmol/L (ref 3.5–5.1)
Sodium: 134 mmol/L — ABNORMAL LOW (ref 135–145)

## 2021-08-03 MED ORDER — DENOSUMAB 60 MG/ML ~~LOC~~ SOSY
60.0000 mg | PREFILLED_SYRINGE | Freq: Once | SUBCUTANEOUS | Status: AC
  Administered 2021-08-03: 60 mg via SUBCUTANEOUS
  Filled 2021-08-03: qty 1

## 2021-08-04 ENCOUNTER — Other Ambulatory Visit: Payer: Self-pay | Admitting: Internal Medicine

## 2021-08-04 DIAGNOSIS — M7918 Myalgia, other site: Secondary | ICD-10-CM

## 2021-08-05 ENCOUNTER — Other Ambulatory Visit: Payer: Self-pay | Admitting: Internal Medicine

## 2021-08-05 ENCOUNTER — Encounter: Payer: Self-pay | Admitting: Internal Medicine

## 2021-08-05 ENCOUNTER — Other Ambulatory Visit: Payer: Self-pay

## 2021-08-05 ENCOUNTER — Ambulatory Visit (INDEPENDENT_AMBULATORY_CARE_PROVIDER_SITE_OTHER): Payer: Medicare Other | Admitting: Internal Medicine

## 2021-08-05 DIAGNOSIS — K5909 Other constipation: Secondary | ICD-10-CM

## 2021-08-05 DIAGNOSIS — D649 Anemia, unspecified: Secondary | ICD-10-CM

## 2021-08-05 DIAGNOSIS — M810 Age-related osteoporosis without current pathological fracture: Secondary | ICD-10-CM | POA: Diagnosis not present

## 2021-08-05 DIAGNOSIS — C50112 Malignant neoplasm of central portion of left female breast: Secondary | ICD-10-CM | POA: Diagnosis not present

## 2021-08-05 DIAGNOSIS — Z9013 Acquired absence of bilateral breasts and nipples: Secondary | ICD-10-CM | POA: Diagnosis not present

## 2021-08-05 DIAGNOSIS — F32A Depression, unspecified: Secondary | ICD-10-CM

## 2021-08-05 DIAGNOSIS — N390 Urinary tract infection, site not specified: Secondary | ICD-10-CM | POA: Diagnosis not present

## 2021-08-05 DIAGNOSIS — E7800 Pure hypercholesterolemia, unspecified: Secondary | ICD-10-CM | POA: Diagnosis not present

## 2021-08-05 DIAGNOSIS — I10 Essential (primary) hypertension: Secondary | ICD-10-CM

## 2021-08-05 DIAGNOSIS — I700 Atherosclerosis of aorta: Secondary | ICD-10-CM

## 2021-08-05 DIAGNOSIS — G20 Parkinson's disease: Secondary | ICD-10-CM

## 2021-08-05 DIAGNOSIS — N1831 Chronic kidney disease, stage 3a: Secondary | ICD-10-CM | POA: Diagnosis not present

## 2021-08-05 DIAGNOSIS — K219 Gastro-esophageal reflux disease without esophagitis: Secondary | ICD-10-CM | POA: Diagnosis not present

## 2021-08-05 DIAGNOSIS — R739 Hyperglycemia, unspecified: Secondary | ICD-10-CM

## 2021-08-05 DIAGNOSIS — C50211 Malignant neoplasm of upper-inner quadrant of right female breast: Secondary | ICD-10-CM

## 2021-08-05 DIAGNOSIS — C50111 Malignant neoplasm of central portion of right female breast: Secondary | ICD-10-CM | POA: Diagnosis not present

## 2021-08-05 DIAGNOSIS — E871 Hypo-osmolality and hyponatremia: Secondary | ICD-10-CM

## 2021-08-05 DIAGNOSIS — F320 Major depressive disorder, single episode, mild: Secondary | ICD-10-CM

## 2021-08-05 DIAGNOSIS — Z9109 Other allergy status, other than to drugs and biological substances: Secondary | ICD-10-CM

## 2021-08-05 LAB — BASIC METABOLIC PANEL
BUN: 16 mg/dL (ref 6–23)
CO2: 30 mEq/L (ref 19–32)
Calcium: 9.4 mg/dL (ref 8.4–10.5)
Chloride: 98 mEq/L (ref 96–112)
Creatinine, Ser: 0.96 mg/dL (ref 0.40–1.20)
GFR: 54.6 mL/min — ABNORMAL LOW (ref >60.00)
Glucose, Bld: 79 mg/dL (ref 70–99)
Potassium: 4.4 mEq/L (ref 3.5–5.1)
Sodium: 135 mEq/L (ref 135–145)

## 2021-08-05 LAB — CBC WITH DIFFERENTIAL/PLATELET
Basophils Absolute: 0.1 10*3/uL (ref 0.0–0.1)
Basophils Relative: 1.1 % (ref 0.0–3.0)
Eosinophils Absolute: 0.2 10*3/uL (ref 0.0–0.7)
Eosinophils Relative: 3.4 % (ref 0.0–5.0)
HCT: 35 % — ABNORMAL LOW (ref 36.0–46.0)
Hemoglobin: 11.4 g/dL — ABNORMAL LOW (ref 12.0–15.0)
Lymphocytes Relative: 27.9 % (ref 12.0–46.0)
Lymphs Abs: 1.6 10*3/uL (ref 0.7–4.0)
MCHC: 32.7 g/dL (ref 30.0–36.0)
MCV: 93.9 fl (ref 78.0–100.0)
Monocytes Absolute: 0.5 10*3/uL (ref 0.1–1.0)
Monocytes Relative: 8.6 % (ref 3.0–12.0)
Neutro Abs: 3.3 10*3/uL (ref 1.4–7.7)
Neutrophils Relative %: 59 % (ref 43.0–77.0)
Platelets: 221 10*3/uL (ref 150.0–400.0)
RBC: 3.73 Mil/uL — ABNORMAL LOW (ref 3.87–5.11)
RDW: 13.7 % (ref 11.5–15.5)
WBC: 5.6 10*3/uL (ref 4.0–10.5)

## 2021-08-05 LAB — LIPID PANEL
Cholesterol: 185 mg/dL (ref 0–200)
HDL: 46.5 mg/dL (ref >39.00)
LDL Cholesterol: 100 mg/dL — ABNORMAL HIGH (ref 0–99)
NonHDL: 138.83
Total CHOL/HDL Ratio: 4
Triglycerides: 195 mg/dL — ABNORMAL HIGH (ref 0.0–149.0)
VLDL: 39 mg/dL (ref 0.0–40.0)

## 2021-08-05 LAB — HEPATIC FUNCTION PANEL
ALT: 19 U/L (ref 0–35)
AST: 24 U/L (ref 0–37)
Albumin: 4.3 g/dL (ref 3.5–5.2)
Alkaline Phosphatase: 34 U/L — ABNORMAL LOW (ref 39–117)
Bilirubin, Direct: 0 mg/dL (ref 0.0–0.3)
Total Bilirubin: 0.4 mg/dL (ref 0.2–1.2)
Total Protein: 7.2 g/dL (ref 6.0–8.3)

## 2021-08-05 LAB — FERRITIN: Ferritin: 93.1 ng/mL (ref 10.0–291.0)

## 2021-08-05 LAB — HEMOGLOBIN A1C: Hgb A1c MFr Bld: 5.8 % (ref 4.6–6.5)

## 2021-08-05 NOTE — Patient Instructions (Signed)
Nasacort nasal spray - 2 sprays each nostril one time per day.  Do this in the evening.   Benefiber - (fiber supplement)

## 2021-08-05 NOTE — Progress Notes (Signed)
Patient ID: Wanda Martinez, female   DOB: 10/28/1935, 85 y.o.   MRN: [id number]   Subjective:    Patient ID: Wanda Martinez, female    DOB: 03/22/1935, 85 y.o.   MRN: [id number]  HPI This visit occurred during the SARS-CoV-2 public health emergency.  Safety protocols were in place, including screening questions prior to the visit, additional usage of staff PPE, and extensive cleaning of exam room while observing appropriate contact time as indicated for disinfecting solutions.   Patient here for a scheduled follow up. Here to follow up regarding her blood pressure, cholesterol.  Also has a history of parkinsons.  She reports doing relatively well.  Tries to stay active.  No chest pain.  Breathing reported as stable.  No acid reflux.  On protonix.  Over the last few months, has noticed dripping nose/drainage and post nasal drainage.  No chest congestion.  No increased cough.  No chest pain reported.  Breathing stable.  No increased abdominal pain reported.  No bowel issues reported.  No sick contacts.  No fever.  No nausea or vomiting.  Followed by oncology.  Last seen 08/03/2021.  Received Prolia.   Past Medical History:  Diagnosis Date   Acute postoperative pain 10/25/2017   Anemia    Arm pain 07/26/2015   Arthritis    Arthritis, degenerative 03/26/2014   Back pain 11/01/2013   Bladder infection 06/2018   Breast cancer ([facility])    Masectomy - left - 1986    Breast cancer ([facility])    Mastectomy-right -2014   CHEST PAIN 04/29/2010   Qualifier: Diagnosis of  By: Johnson RN, Erika     Chronic cystitis    Cystocele 02/22/2013   Cystocele, midline 08/19/2013   Degeneration of intervertebral disc of lumbosacral region 03/26/2014   DYSPNEA 04/29/2010   Qualifier: Diagnosis of  By: Johnson RN, Erika     Enthesopathy of hip 03/26/2014   GERD (gastroesophageal reflux disease)    Hiatal hernia    HTN (hypertension)    Hypokalemia 06/2018   Hyponatremia 06/2018   LBP (low back pain) 03/26/2014   Neck  pain 11/01/2013   Parkinson disease ([facility])    Pneumonia 06/2018   Sinusitis 02/07/2015   Skin lesions 07/12/2014   Urinary incontinence    mixed    Past Surgical History:  Procedure Laterality Date   ABDOMINAL HYSTERECTOMY     BREAST BIOPSY  2013   BREAST ENHANCEMENT SURGERY  1987   BREAST IMPLANT REMOVAL     BREAST IMPLANT REMOVAL Right 08/29/2012   BREAST SURGERY  1986   s/p left mastectomy   COLONOSCOPY WITH PROPOFOL N/A 09/13/2016   Procedure: COLONOSCOPY WITH PROPOFOL;  Surgeon: Robert T Elliott, MD;  Location: [facility] ENDOSCOPY;  Service: Endoscopy;  Laterality: N/A;   MASTECTOMY  08/2012   right   TONSILLECTOMY AND ADENOIDECTOMY  81   VESICOVAGINAL FISTULA CLOSURE W/ TAH  1983   Family History  Problem Relation Age of Onset   Diabetes Father    Stroke Father    Colon polyps Father    Stroke Mother    Parkinson's disease Mother    Social History   Socioeconomic History   Marital status: Married    Spouse name: Not on file   Number of children: 3   Years of education: Not on file   Highest education level: Not on file  Occupational History   Not on file  Tobacco Use   Smoking status: Never   Smokeless   tobacco: Never   Tobacco comments:    tobacco use - no  Vaping Use   Vaping Use: Never used  Substance and Sexual Activity   Alcohol use: No    Alcohol/week: 0.0 standard drinks   Drug use: No   Sexual activity: Never  Other Topics Concern   Not on file  Social History Narrative   Not on file   Social Determinants of Health   Financial Resource Strain: Not on file  Food Insecurity: Not on file  Transportation Needs: Not on file  Physical Activity: Not on file  Stress: Not on file  Social Connections: Not on file    Review of Systems  Constitutional:  Negative for appetite change and unexpected weight change.  HENT:  Positive for postnasal drip. Negative for sinus pressure.   Respiratory:  Negative for chest tightness and shortness of breath.         Non increased cough.   Cardiovascular:  Negative for chest pain and palpitations.  Gastrointestinal:  Negative for abdominal pain, diarrhea, nausea and vomiting.  Genitourinary:  Negative for difficulty urinating and dysuria.  Musculoskeletal:  Negative for joint swelling and myalgias.  Skin:  Negative for color change and rash.  Neurological:  Negative for dizziness, light-headedness and headaches.  Psychiatric/Behavioral:  Negative for agitation and dysphoric mood.       Objective:    Physical Exam Vitals reviewed.  Constitutional:      General: She is not in acute distress.    Appearance: Normal appearance.  HENT:     Head: Normocephalic and atraumatic.     Right Ear: External ear normal.     Left Ear: External ear normal.  Eyes:     General: No scleral icterus.       Right eye: No discharge.        Left eye: No discharge.     Conjunctiva/sclera: Conjunctivae normal.  Neck:     Thyroid: No thyromegaly.  Cardiovascular:     Rate and Rhythm: Normal rate and regular rhythm.  Pulmonary:     Effort: No respiratory distress.     Breath sounds: Normal breath sounds. No wheezing.  Abdominal:     General: Bowel sounds are normal.     Palpations: Abdomen is soft.     Tenderness: There is no abdominal tenderness.  Musculoskeletal:        General: No swelling or tenderness.     Cervical back: Neck supple. No tenderness.  Lymphadenopathy:     Cervical: No cervical adenopathy.  Skin:    Findings: No erythema or rash.  Neurological:     Mental Status: She is alert.  Psychiatric:        Mood and Affect: Mood normal.        Behavior: Behavior normal.    BP 126/84   Pulse (!) 57   Temp 97.9 F (36.6 C)   Ht 5' 4.02" (1.626 m)   Wt 182 lb 9.6 oz (82.8 kg)   LMP 12/18/1981   SpO2 95%   BMI 31.33 kg/m  Wt Readings from Last 3 Encounters:  08/05/21 182 lb 9.6 oz (82.8 kg)  08/03/21 184 lb 8 oz (83.7 kg)  07/27/21 179 lb (81.2 kg)    Outpatient Encounter Medications as  of 08/05/2021  Medication Sig   aspirin 81 MG EC tablet Take 81 mg by mouth daily as needed.   calcium carbonate (OSCAL) 1500 (600 Ca) MG TABS tablet Take 1 tablet by mouth daily.     citalopram (CELEXA) 20 MG tablet TAKE 1 & 1/2 TABLETS BY MOUTH ONCE DAILY   cyclobenzaprine (FLEXERIL) 10 MG tablet TAKE ONE TABLET BY MOUTH AT BEDTIME.   donepezil (ARICEPT) 5 MG tablet    gabapentin (NEURONTIN) 300 MG capsule Take by mouth.   GLUCOSAMINE SULFATE PO Take by mouth daily.    Glucosamine-Chondroitin 500-400 MG CAPS Take 2 capsules by mouth daily.   hydrochlorothiazide (HYDRODIURIL) 25 MG tablet TAKE 1 TABLET BY MOUTH ONCE DAILY. (PATIENT TAKES 1/2 ONCE DAILY AND EXTRA 1/2 IF NEEDED)   HYDROcodone-acetaminophen (NORCO/VICODIN) 5-325 MG tablet Take 1 tablet by mouth every 8 (eight) hours as needed for severe pain. Must last 30 days   [START ON 08/29/2021] HYDROcodone-acetaminophen (NORCO/VICODIN) 5-325 MG tablet Take 1 tablet by mouth every 8 (eight) hours as needed for severe pain. Must last 30 days   [START ON 09/28/2021] HYDROcodone-acetaminophen (NORCO/VICODIN) 5-325 MG tablet Take 1 tablet by mouth every 8 (eight) hours as needed for severe pain. Must last 30 days   [START ON 10/28/2021] HYDROcodone-acetaminophen (NORCO/VICODIN) 5-325 MG tablet Take 1 tablet by mouth every 8 (eight) hours as needed for severe pain. Must last 30 days   letrozole (FEMARA) 2.5 MG tablet Take 2.5 mg by mouth daily.   lubiprostone (AMITIZA) 24 MCG capsule TAKE 1 CAPSULE BY MOUTH ONCE DAILY WITH BREAKFAST.   magnesium oxide (MAG-OX) 400 (241.3 Mg) MG tablet TAKE 1 TABLET BY MOUTH ONCE DAILY.   melatonin 1 MG TABS tablet Take 3 mg by mouth at bedtime.   meloxicam (MOBIC) 7.5 MG tablet TAKE 1 TABLET BY MOUTH ONCE DAILY.   metoprolol succinate (TOPROL-XL) 25 MG 24 hr tablet TAKE 1 TABLET BY MOUTH TWICE DAILY   Multiple Vitamin (MULTIVITAMIN) tablet Take 1 tablet by mouth daily.   nitrofurantoin, macrocrystal-monohydrate,  (MACROBID) 100 MG capsule Take 1 capsule (100 mg total) by mouth daily.   Omega-3 1000 MG CAPS Take by mouth.   omeprazole (PRILOSEC) 20 MG capsule TAKE 1 CAPSULE BY MOUTH TWICE DAILY FOR REFLUX.   [DISCONTINUED] amLODipine (NORVASC) 2.5 MG tablet TAKE 1 TABLET BY MOUTH ONCE DAILY.   [DISCONTINUED] losartan (COZAAR) 50 MG tablet TAKE (2) TABLETS BY MOUTH ONCE DAILY.   [DISCONTINUED] pantoprazole (PROTONIX) 40 MG tablet TAKE 1 TABLET BY MOUTH ONCE DAILY.   No facility-administered encounter medications on file as of 08/05/2021.     Lab Results  Component Value Date   WBC 5.6 08/05/2021   HGB 11.4 (L) 08/05/2021   HCT 35.0 (L) 08/05/2021   PLT 221.0 08/05/2021   GLUCOSE 79 08/05/2021   CHOL 185 08/05/2021   TRIG 195.0 (H) 08/05/2021   HDL 46.50 08/05/2021   LDLDIRECT 73.0 07/09/2018   LDLCALC 100 (H) 08/05/2021   ALT 19 08/05/2021   AST 24 08/05/2021   NA 135 08/05/2021   K 4.4 08/05/2021   CL 98 08/05/2021   CREATININE 0.96 08/05/2021   BUN 16 08/05/2021   CO2 30 08/05/2021   TSH 1.16 03/17/2021   HGBA1C 5.8 08/05/2021    Ultrasound renal complete  Result Date: 07/22/2021 CLINICAL DATA:  Recurrent UTI EXAM: RENAL / URINARY TRACT ULTRASOUND COMPLETE COMPARISON:  None. FINDINGS: Right Kidney: Renal measurements: 10.0 x 4.3 x 4.2 cm = volume: 96 mL. Echogenicity within normal limits. No mass or hydronephrosis visualized. Left Kidney: Renal measurements: 9.6 x 4.5 x 4.7 cm = volume: 104 mL. Increased cortical echogenicity. Bladder: Appears normal for degree of bladder distention. Other: None. IMPRESSION: 1. Mild increased cortical echogenicity associated   with the left kidney, a nonspecific finding. 2. No other abnormalities. Electronically Signed   By: David  Williams III M.D   On: 07/22/2021 11:31       Assessment & Plan:   Problem List Items Addressed This Visit     Anemia    Follow cbc.  Followed by hematology.  Per hematology, no further w/up warranted.  If starts to  decline, further w/up then.  Recheck today.       Aortic atherosclerosis ([facility]CC)     Off pravastatin.  Check lipid panel today.  Given calculated cholesterol risk and aortic atherosclerosis- needs statin medication.        Chronic constipation    benefiber as directed.  Notify me if problems.  Taking probiotic.        CKD (chronic kidney disease) stage 3, GFR 30-59 ml/min (HCC)    Avoid antiinflammatories.  Stay hdrated.  Follow metabolic panel.        Environmental allergies    Continue saline nasal spray.  nasacort nasal spray as directed.  Follow.       Essential hypertension    Well controlled Continue current medications:  Hctz, metoprolol, amlodipine and losartan.  Recheck metabolic panel      Frequent UTI    Seeing urology.  Stable.  No urinary symptoms today.       GERD (gastroesophageal reflux disease)    No upper symptoms reported. On protonix.       Hyperglycemia    Low carb diet and exercise.  Follow met b and a1c.       Hyponatremia    Follow sodium.  Recheck today.       Mild depression (HCC)    Continue citalopram.  Overall appears to be handling things well.  Follow.        Osteoporosis    prolia - 07/2021       Parkinsons (HCC)    Followed by neurology.  Stable.       Primary cancer of upper inner quadrant of right female breast (HCC)    On femara.  Followed by oncology.       Pure hypercholesterolemia    Off pravastatin.  Check lipid panel.  Given calculated cholesterol risk, needs statin medication.          , MD  

## 2021-08-06 ENCOUNTER — Encounter: Payer: Self-pay | Admitting: Internal Medicine

## 2021-08-06 DIAGNOSIS — Z9109 Other allergy status, other than to drugs and biological substances: Secondary | ICD-10-CM | POA: Insufficient documentation

## 2021-08-06 NOTE — Assessment & Plan Note (Signed)
Seeing urology.  Stable.  No urinary symptoms today.

## 2021-08-06 NOTE — Assessment & Plan Note (Signed)
benefiber as directed.  Notify me if problems.  Taking probiotic.

## 2021-08-06 NOTE — Assessment & Plan Note (Signed)
Follow sodium.  Recheck today.

## 2021-08-06 NOTE — Assessment & Plan Note (Signed)
Continue citalopram.  Overall appears to be handling things well.  Follow.   

## 2021-08-06 NOTE — Assessment & Plan Note (Signed)
On femara.  Followed by oncology.

## 2021-08-06 NOTE — Assessment & Plan Note (Signed)
No upper symptoms reported.  On protonix.   

## 2021-08-06 NOTE — Assessment & Plan Note (Signed)
Avoid antiinflammatories.  Stay hdrated.  Follow metabolic panel.   

## 2021-08-06 NOTE — Assessment & Plan Note (Signed)
Followed by neurology.  Stable  

## 2021-08-06 NOTE — Assessment & Plan Note (Addendum)
Off pravastatin.  Check lipid panel today.  Given calculated cholesterol risk and aortic atherosclerosis- needs statin medication.

## 2021-08-06 NOTE — Assessment & Plan Note (Signed)
Low carb diet and exercise.  Follow met b and a1c.  

## 2021-08-06 NOTE — Assessment & Plan Note (Signed)
Continue saline nasal spray.  nasacort nasal spray as directed.  Follow.

## 2021-08-06 NOTE — Assessment & Plan Note (Signed)
Well controlled Continue current medications:  Hctz, metoprolol, amlodipine and losartan.  Recheck metabolic panel

## 2021-08-06 NOTE — Assessment & Plan Note (Signed)
prolia - 07/2021

## 2021-08-06 NOTE — Assessment & Plan Note (Signed)
Off pravastatin.  Check lipid panel.  Given calculated cholesterol risk, needs statin medication.

## 2021-08-06 NOTE — Assessment & Plan Note (Signed)
Follow cbc.  Followed by hematology.  Per hematology, no further w/up warranted.  If starts to decline, further w/up then.  Recheck today.

## 2021-08-08 NOTE — Progress Notes (Signed)
PROVIDER NOTE: Information contained herein reflects review and annotations entered in association with encounter. Interpretation of such information and data should be left to medically-trained personnel. Information provided to patient can be located elsewhere in the medical record under "Patient Instructions". Document created using STT-dictation technology, any transcriptional errors that may result from process are unintentional.    Patient: Wanda Martinez  Service Category: Procedure  Provider: Gaspar Cola, MD  DOB: [birth year]  DOS: 08/09/2021  Location: [facility]  MRN: [id number]  Setting: Ambulatory - outpatient  Referring Provider: Milinda Pointer, MD  Type: Established Patient  Specialty: Interventional Pain Management  PCP: Einar Pheasant, MD   Primary Reason for Visit: Interventional Pain Management Treatment. CC: Back Pain  Procedure:          Anesthesia, Analgesia, Anxiolysis:  Type: Lumbar Facet, Medial Branch Block(s)          Primary Purpose: Diagnostic Region: Posterolateral Lumbosacral Spine Level: L2, L3, L4, L5, & S1 Medial Branch Level(s). Injecting these levels blocks the L3-4, L4-5, and L5-S1 lumbar facet joints. Laterality: Bilateral  Type: Minimal (Conscious) Anxiolysis combined with Local Anesthesia Indication(s): Analgesia and Anxiety Route: Oral (PO) IV Access: Secured Sedation: Meaningful verbal contact was maintained at all times during the procedure  Local Anesthetic: Lidocaine 1-2%  Position: Prone   Indications: 1. Lumbar facet syndrome (Bilateral) (R>L)   2. Lumbar facet hypertrophy (Multilevel) (Bilateral)   3. Chronic low back pain (1ry area of Pain) (Bilateral) (R>L) w/o sciatica   4. Spondylosis without myelopathy or radiculopathy, lumbosacral region   5. DDD (degenerative disc disease), lumbosacral    Pain Score: Pre-procedure: 8 /10 Post-procedure: 2 /10   Pre-op H&P Assessment:  Wanda Martinez is a 86 y.o.  (year old), female patient, seen today for interventional treatment. She  has a past surgical history that includes Tonsillectomy and adenoidectomy (81); Vesicovaginal fistula closure w/ TAH (1983); Breast surgery (1986); Breast enhancement surgery (1987); Breast implant removal; Breast implant removal (Right, 08/29/2012); Mastectomy (08/2012); Abdominal hysterectomy; Colonoscopy with propofol (N/A, 09/13/2016); and Breast biopsy (2013). Wanda Martinez has a current medication list which includes the following prescription(s): amlodipine, aspirin, calcium carbonate, citalopram, cyclobenzaprine, donepezil, gabapentin, glucosamine sulfate, glucosamine-chondroitin, hydrochlorothiazide, hydrocodone-acetaminophen, [START ON 08/29/2021] hydrocodone-acetaminophen, [START ON 09/28/2021] hydrocodone-acetaminophen, [START ON 10/28/2021] hydrocodone-acetaminophen, letrozole, losartan, lubiprostone, magnesium oxide, melatonin, meloxicam, metoprolol succinate, multivitamin, nitrofurantoin (macrocrystal-monohydrate), omega-3, omeprazole, and pantoprazole. Her primarily concern today is the Back Pain  Initial Vital Signs:  Pulse/HCG Rate: (!) 55ECG Martinez Rate: (!) 54 Temp: (!) 96.5 F (35.8 C) Resp: 16 BP: 102/83 SpO2: 100 %  BMI: Estimated body mass index is 30.55 kg/m as calculated from the following:   Height as of this encounter: '5\' 4"'$  (1.626 m).   Weight as of this encounter: 178 lb (80.7 kg).  Risk Assessment: Allergies: Reviewed. She is allergic to sulfa antibiotics and vesicare [solifenacin].  Allergy Precautions: None required Coagulopathies: Reviewed. None identified.  Blood-thinner therapy: None at this time Active Infection(s): Reviewed. None identified. Wanda Martinez is afebrile  Site Confirmation: Wanda Martinez was asked to confirm the procedure and laterality before marking the site Procedure checklist: Completed Consent: Before the procedure and under the influence of no sedative(s), amnesic(s), or  anxiolytics, the patient was informed of the treatment options, risks and possible complications. To fulfill our ethical and legal obligations, as recommended by the American Medical Association's Code of Ethics, I have informed the patient of my clinical impression; the nature and purpose of the treatment or procedure;  the risks, benefits, and possible complications of the intervention; the alternatives, including doing nothing; the risk(s) and benefit(s) of the alternative treatment(s) or procedure(s); and the risk(s) and benefit(s) of doing nothing. The patient was provided information about the general risks and possible complications associated with the procedure. These may include, but are not limited to: failure to achieve desired goals, infection, bleeding, organ or nerve damage, allergic reactions, paralysis, and death. In addition, the patient was informed of those risks and complications associated to Spine-related procedures, such as failure to decrease pain; infection (i.e.: Meningitis, epidural or intraspinal abscess); bleeding (i.e.: epidural hematoma, subarachnoid hemorrhage, or any other type of intraspinal or peri-dural bleeding); organ or nerve damage (i.e.: Any type of peripheral nerve, nerve root, or spinal cord injury) with subsequent damage to sensory, motor, and/or autonomic systems, resulting in permanent pain, numbness, and/or weakness of one or several areas of the body; allergic reactions; (i.e.: anaphylactic reaction); and/or death. Furthermore, the patient was informed of those risks and complications associated with the medications. These include, but are not limited to: allergic reactions (i.e.: anaphylactic or anaphylactoid reaction(s)); adrenal axis suppression; blood sugar elevation that in diabetics may result in ketoacidosis or comma; water retention that in patients with history of congestive Martinez failure may result in shortness of breath, pulmonary edema, and decompensation  with resultant Martinez failure; weight gain; swelling or edema; medication-induced neural toxicity; particulate matter embolism and blood vessel occlusion with resultant organ, and/or nervous system infarction; and/or aseptic necrosis of one or more joints. Finally, the patient was informed that Medicine is not an exact science; therefore, there is also the possibility of unforeseen or unpredictable risks and/or possible complications that may result in a catastrophic outcome. The patient indicated having understood very clearly. We have given the patient no guarantees and we have made no promises. Enough time was given to the patient to ask questions, all of which were answered to the patient's satisfaction. Wanda Martinez has indicated that she wanted to continue with the procedure. Attestation: I, the ordering provider, attest that I have discussed with the patient the benefits, risks, side-effects, alternatives, likelihood of achieving goals, and potential problems during recovery for the procedure that I have provided informed consent. Date  Time: 08/09/2021 10:03 AM  Pre-Procedure Preparation:  Monitoring: As per clinic protocol. Respiration, ETCO2, SpO2, BP, Martinez rate and rhythm monitor placed and checked for adequate function Safety Precautions: Patient was assessed for positional comfort and pressure points before starting the procedure. Time-out: I initiated and conducted the "Time-out" before starting the procedure, as per protocol. The patient was asked to participate by confirming the accuracy of the "Time Out" information. Verification of the correct person, site, and procedure were performed and confirmed by me, the nursing staff, and the patient. "Time-out" conducted as per Joint Commission's Universal Protocol (UP.01.01.01). Time: 1058  Description of Procedure:          Laterality: Bilateral. The procedure was performed in identical fashion on both sides. Levels:  L2, L3, L4, L5, & S1  Medial Branch Level(s) Area Prepped: Posterior Lumbosacral Region DuraPrep (Iodine Povacrylex [0.7% available iodine] and Isopropyl Alcohol, 74% w/w) Safety Precautions: Aspiration looking for blood return was conducted prior to all injections. At no point did we inject any substances, as a needle was being advanced. Before injecting, the patient was told to immediately notify me if she was experiencing any new onset of "ringing in the ears, or metallic taste in the mouth". No attempts were made at seeking  any paresthesias. Safe injection practices and needle disposal techniques used. Medications properly checked for expiration dates. SDV (single dose vial) medications used. After the completion of the procedure, all disposable equipment used was discarded in the proper designated medical waste containers. Local Anesthesia: Protocol guidelines were followed. The patient was positioned over the fluoroscopy table. The area was prepped in the usual manner. The time-out was completed. The target area was identified using fluoroscopy. A 12-in long, straight, sterile hemostat was used with fluoroscopic guidance to locate the targets for each level blocked. Once located, the skin was marked with an approved surgical skin marker. Once all sites were marked, the skin (epidermis, dermis, and hypodermis), as well as deeper tissues (fat, connective tissue and muscle) were infiltrated with a small amount of a short-acting local anesthetic, loaded on a 10cc syringe with a 25G, 1.5-in  Needle. An appropriate amount of time was allowed for local anesthetics to take effect before proceeding to the next step. Local Anesthetic: Lidocaine 2.0% The unused portion of the local anesthetic was discarded in the proper designated containers. Technical explanation of process:  L2 Medial Branch Nerve Block (MBB): The target area for the L2 medial branch is at the junction of the postero-lateral aspect of the superior articular process  and the superior, posterior, and medial edge of the transverse process of L3. Under fluoroscopic guidance, a Quincke needle was inserted until contact was made with os over the superior postero-lateral aspect of the pedicular shadow (target area). After negative aspiration for blood, 0.5 mL of the nerve block solution was injected without difficulty or complication. The needle was removed intact. L3 Medial Branch Nerve Block (MBB): The target area for the L3 medial branch is at the junction of the postero-lateral aspect of the superior articular process and the superior, posterior, and medial edge of the transverse process of L4. Under fluoroscopic guidance, a Quincke needle was inserted until contact was made with os over the superior postero-lateral aspect of the pedicular shadow (target area). After negative aspiration for blood, 0.5 mL of the nerve block solution was injected without difficulty or complication. The needle was removed intact. L4 Medial Branch Nerve Block (MBB): The target area for the L4 medial branch is at the junction of the postero-lateral aspect of the superior articular process and the superior, posterior, and medial edge of the transverse process of L5. Under fluoroscopic guidance, a Quincke needle was inserted until contact was made with os over the superior postero-lateral aspect of the pedicular shadow (target area). After negative aspiration for blood, 0.5 mL of the nerve block solution was injected without difficulty or complication. The needle was removed intact. L5 Medial Branch Nerve Block (MBB): The target area for the L5 medial branch is at the junction of the postero-lateral aspect of the superior articular process and the superior, posterior, and medial edge of the sacral ala. Under fluoroscopic guidance, a Quincke needle was inserted until contact was made with os over the superior postero-lateral aspect of the pedicular shadow (target area). After negative aspiration for  blood, 0.5 mL of the nerve block solution was injected without difficulty or complication. The needle was removed intact. S1 Medial Branch Nerve Block (MBB): The target area for the S1 medial branch is at the posterior and inferior 6 o'clock position of the L5-S1 facet joint. Under fluoroscopic guidance, the Quincke needle inserted for the L5 MBB was redirected until contact was made with os over the inferior and postero aspect of the sacrum, at the  6 o' clock position under the L5-S1 facet joint (Target area). After negative aspiration for blood, 0.5 mL of the nerve block solution was injected without difficulty or complication. The needle was removed intact.  Nerve block solution: 0.2% PF-Ropivacaine + Triamcinolone (40 mg/mL) diluted to a final concentration of 4 mg of Triamcinolone/mL of Ropivacaine The unused portion of the solution was discarded in the proper designated containers. Procedural Needles: 22-gauge, 3.5-inch, Quincke needles used for all levels.  Once the entire procedure was completed, the treated area was cleaned, making sure to leave some of the prepping solution back to take advantage of its long term bactericidal properties.      Illustration of the posterior view of the lumbar spine and the posterior neural structures. Laminae of L2 through S1 are labeled. DPRL5, dorsal primary ramus of L5; DPRS1, dorsal primary ramus of S1; DPR3, dorsal primary ramus of L3; FJ, facet (zygapophyseal) joint L3-L4; I, inferior articular process of L4; LB1, lateral branch of dorsal primary ramus of L1; IAB, inferior articular branches from L3 medial branch (supplies L4-L5 facet joint); IBP, intermediate branch plexus; MB3, medial branch of dorsal primary ramus of L3; NR3, third lumbar nerve root; S, superior articular process of L5; SAB, superior articular branches from L4 (supplies L4-5 facet joint also); TP3, transverse process of L3.  Vitals:   08/09/21 1055 08/09/21 1100 08/09/21 1105 08/09/21  1108  BP: (!) 150/75 (!) 155/79 (!) 136/109 (!) 156/92  Pulse:      Resp: '16 18 16 18  '$ Temp:      TempSrc:      SpO2: 98% 99% 100% 100%  Weight:      Height:         Start Time: 1058 hrs. End Time: 1108 hrs.  Imaging Guidance (Spinal):          Type of Imaging Technique: Fluoroscopy Guidance (Spinal) Indication(s): Assistance in needle guidance and placement for procedures requiring needle placement in or near specific anatomical locations not easily accessible without such assistance. Exposure Time: Please see nurses notes. Contrast: None used. Fluoroscopic Guidance: I was personally present during the use of fluoroscopy. "Tunnel Vision Technique" used to obtain the best possible view of the target area. Parallax error corrected before commencing the procedure. "Direction-depth-direction" technique used to introduce the needle under continuous pulsed fluoroscopy. Once target was reached, antero-posterior, oblique, and lateral fluoroscopic projection used confirm needle placement in all planes. Images permanently stored in EMR. Interpretation: No contrast injected. I personally interpreted the imaging intraoperatively. Adequate needle placement confirmed in multiple planes. Permanent images saved into the patient's record.  Antibiotic Prophylaxis:   Anti-infectives (From admission, onward)    None      Indication(s): None identified  Post-operative Assessment:  Post-procedure Vital Signs:  Pulse/HCG Rate: (!) 55(!) 54 Temp: (!) 96.5 F (35.8 C) Resp: 18 BP: (!) 156/92 SpO2: 100 %  EBL: None  Complications: No immediate post-treatment complications observed by team, or reported by patient.  Note: The patient tolerated the entire procedure well. A repeat set of vitals were taken after the procedure and the patient was kept under observation following institutional policy, for this type of procedure. Post-procedural neurological assessment was performed, showing return to  baseline, prior to discharge. The patient was provided with post-procedure discharge instructions, including a section on how to identify potential problems. Should any problems arise concerning this procedure, the patient was given instructions to immediately contact us, at any time, without hesitation. In any case, we plan to  contact the patient by telephone for a follow-up status report regarding this interventional procedure.  Comments:  No additional relevant information.  Plan of Care  Orders:  Orders Placed This Encounter  Procedures   LUMBAR FACET(MEDIAL BRANCH NERVE BLOCK) MBNB    Scheduling Instructions:     Procedure: Lumbar facet block (AKA.: Lumbosacral medial branch nerve block)     Side: Bilateral     Level: L3-4, L4-5, & L5-S1 Facets (L2, L3, L4, L5, & S1 Medial Branch Nerves)     Sedation: Patient's choice.     Timeframe: Today    Order Specific Question:   Where will this procedure be performed?    Answer:   [facility]   KNEE INJECTION    Local Anesthetic & Steroid injection.    Standing Status:   Future    Standing Expiration Date:   11/09/2021    Scheduling Instructions:     Side: Right-sided     Sedation: None     Timeframe: Ask patient when she would like to come in for the knee injection.    Order Specific Question:   Where will this procedure be performed?    Answer:   [facility]   DG PAIN CLINIC C-ARM 1-60 MIN NO REPORT    Intraoperative interpretation by procedural physician at [place].    Standing Status:   Standing    Number of Occurrences:   1    Order Specific Question:   Reason for exam:    Answer:   Assistance in needle guidance and placement for procedures requiring needle placement in or near specific anatomical locations not easily accessible without such assistance.   Informed Consent Details: Physician/Practitioner Attestation; Transcribe to consent form and obtain patient signature    Nursing Order: Transcribe  to consent form and obtain patient signature. Note: Always confirm laterality of pain with Wanda Martinez, before procedure.    Order Specific Question:   Physician/Practitioner attestation of informed consent for procedure/surgical case    Answer:   I, the physician/practitioner, attest that I have discussed with the patient the benefits, risks, side effects, alternatives, likelihood of achieving goals and potential problems during recovery for the procedure that I have provided informed consent.    Order Specific Question:   Procedure    Answer:   Lumbar Facet Block  under fluoroscopic guidance    Order Specific Question:   Physician/Practitioner performing the procedure    Answer:   Hallel Denherder A. Dossie Arbour MD    Order Specific Question:   Indication/Reason    Answer:   Low Back Pain, with our without leg pain, due to Facet Joint Arthralgia (Joint Pain) Spondylosis (Arthritis of the Spine), without myelopathy or radiculopathy (Nerve Damage).   Provide equipment / supplies at bedside    "Block Tray" (Disposable  single use) Needle type: SpinalSpinal Amount/quantity: 4 Size: Medium (5-inch) Gauge: 22G    Standing Status:   Standing    Number of Occurrences:   1    Order Specific Question:   Specify    Answer:   Block Tray   Informed Consent Details: Physician/Practitioner Attestation; Transcribe to consent form and obtain patient signature    Note: Always confirm laterality of pain with Wanda Martinez, before procedure. Transcribe to consent form and obtain patient signature.    Order Specific Question:   Physician/Practitioner attestation of informed consent for procedure/surgical case    Answer:   I, the physician/practitioner, attest that I have discussed with the  patient the benefits, risks, side effects, alternatives, likelihood of achieving goals and potential problems during recovery for the procedure that I have provided informed consent.    Order Specific Question:   Procedure    Answer:    Right-sided intra-articular knee arthrocentesis (aspiration and/or injection)    Order Specific Question:   Physician/Practitioner performing the procedure    Answer:   Ligaya Cormier A. 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   Medications ordered for procedure: Meds ordered this encounter  Medications   lidocaine (XYLOCAINE) 2 % (with pres) injection 400 mg   ropivacaine (PF) 2 mg/mL (0.2%) (NAROPIN) injection 18 mL   triamcinolone acetonide (KENALOG-40) injection 80 mg    Medications administered: We administered lidocaine, ropivacaine (PF) 2 mg/mL (0.2%), and triamcinolone acetonide.  See the medical record for exact dosing, route, and time of administration.  Follow-up plan:   Return in about 2 weeks (around 08/23/2021) for [facility]) procedure: (R) Knee steroid inj. #1, in addition, (PPE).       Interventional Therapies  Risk  Complexity Considerations:   Estimated body mass index is 31.41 kg/m as calculated from the following:   Height as of this encounter: '5\' 4"'$  (1.626 m).   Weight as of this encounter: 183 lb (83 kg). WNL   Planned  Pending:   Palliative bilateral lumbar facet block    Under consideration:   Therapeutic bilateral lumbar facet RFA  Diagnostic right genicular nerve blocks  Therapeutic right genicular nerve RFA  Diagnostic bilateral cervical facet block  Possible bilateral cervical facet RFA    Completed:   Therapeutic right superior/lateral trapezius muscle TPI x1 (04/19/2021)  Therapeutic right thoracic back TPI x2 (10/17/2018)  Therapeutic left thoracic back TPI x1 (10/17/2018) Therapeutic right quadratus lumborum and erector spinae muscle TPI x1 (02/12/2018) Therapeutic right lumbar facet block x8 (03/15/2021)  Therapeutic left  lumbar facet block x4 (03/15/2021)  Therapeutic right lumbar facet RFA x2 (06/04/2018) (100/90/75/75-100) Therapeutic left lumbar facet RFA x1 (02/12/2018) (100/60/90/>75) Diagnostic right SI joint block x3 (12/21/2020)  Diagnostic left SI joint block x1 (08/09/2017)  Therapeutic right SI joint RFA x3 (06/05/2019)  Therapeutic midline L2-3 LESI x1 (10/04/2017) (100/100/85/>50) Therapeutic right L3-4 LESI x2 (11/04/2020) Therapeutic right L4-5 LESI x1 (05/20/2020) Therapeutic right L5-S1 LESI x2 (12/04/2019) Therapeutic right L5 TFESI x3 (12/04/2019)  Therapeutic right S1 TFESI x1 (11/04/2020) Diagnostic bilateral superficial trochanteric bursa injection x1 (04/30/2018)  Diagnostic right IA hip injection x1 (02/13/2019)  Diagnostic/therapeutic right IA knee injection (steroid) x1 (04/19/2021)    Palliative options:   Palliative bilateral lumbar facet RFA  Palliative bilateral lumbar facet blocks        Recent Visits Date Type Provider Dept  07/27/21 Office Visit Milinda Pointer, MD [facility]  06/07/21 Procedure visit Milinda Pointer, MD [facility]  06/01/21 Office Visit Milinda Pointer, MD [facility]  Showing recent visits within past 90 days and meeting all other requirements Today's Visits Date Type Provider Dept  08/09/21 Procedure visit Milinda Pointer, MD [facility]  Showing today's visits and meeting all other requirements Future Appointments Date Type Provider Dept  08/31/21 Appointment Milinda Pointer, MD [facility]  Showing future appointments within next 90 days and meeting all other requirements Disposition: Discharge home  Discharge (  Date  Time): 08/09/2021; 1120 hrs.   Primary Care Physician: Einar Pheasant, MD Location: [facility] Note by: Gaspar Cola, MD Date: 08/09/2021; Time: 5:23 PM  Disclaimer:  Medicine is not an Chief Strategy Officer. The only guarantee in medicine  is that nothing is guaranteed. It is important to note that the decision to proceed with this intervention was based on the information collected from the patient. The Data and conclusions were drawn from the patient's questionnaire, the interview, and the physical examination. Because the information was provided in large part by the patient, it cannot be guaranteed that it has not been purposely or unconsciously manipulated. Every effort has been made to obtain as much relevant data as possible for this evaluation. It is important to note that the conclusions that lead to this procedure are derived in large part from the available data. Always take into account that the treatment will also be dependent on availability of resources and existing treatment guidelines, considered by other Pain Management Practitioners as being common knowledge and practice, at the time of the intervention. For Medico-Legal purposes, it is also important to point out that variation in procedural techniques and pharmacological choices are the acceptable norm. The indications, contraindications, technique, and results of the above procedure should only be interpreted and judged by a Board-Certified Interventional Pain Specialist with extensive familiarity and expertise in the same exact procedure and technique.

## 2021-08-09 ENCOUNTER — Ambulatory Visit
Admission: RE | Admit: 2021-08-09 | Discharge: 2021-08-09 | Disposition: A | Discharge status: Home / Self Care | Payer: Medicare Other | Source: Ambulatory Visit | Attending: Pain Medicine | Admitting: Pain Medicine

## 2021-08-09 ENCOUNTER — Telehealth: Payer: Self-pay

## 2021-08-09 ENCOUNTER — Ambulatory Visit (HOSPITAL_BASED_OUTPATIENT_CLINIC_OR_DEPARTMENT_OTHER): Payer: Medicare Other | Admitting: Pain Medicine

## 2021-08-09 ENCOUNTER — Other Ambulatory Visit: Payer: Self-pay

## 2021-08-09 ENCOUNTER — Encounter: Payer: Self-pay | Admitting: Pain Medicine

## 2021-08-09 VITALS — BP 156/92 | HR 55 | Temp 96.5°F | Resp 18 | Ht 64.0 in | Wt 178.0 lb

## 2021-08-09 DIAGNOSIS — M47817 Spondylosis without myelopathy or radiculopathy, lumbosacral region: Secondary | ICD-10-CM | POA: Insufficient documentation

## 2021-08-09 DIAGNOSIS — M47816 Spondylosis without myelopathy or radiculopathy, lumbar region: Secondary | ICD-10-CM

## 2021-08-09 DIAGNOSIS — M545 Low back pain, unspecified: Secondary | ICD-10-CM | POA: Diagnosis not present

## 2021-08-09 DIAGNOSIS — M1711 Unilateral primary osteoarthritis, right knee: Secondary | ICD-10-CM | POA: Diagnosis not present

## 2021-08-09 DIAGNOSIS — M25561 Pain in right knee: Secondary | ICD-10-CM | POA: Insufficient documentation

## 2021-08-09 DIAGNOSIS — M5137 Other intervertebral disc degeneration, lumbosacral region: Secondary | ICD-10-CM

## 2021-08-09 DIAGNOSIS — G8929 Other chronic pain: Secondary | ICD-10-CM | POA: Insufficient documentation

## 2021-08-09 MED ORDER — TRIAMCINOLONE ACETONIDE 40 MG/ML IJ SUSP
80.0000 mg | Freq: Once | INTRAMUSCULAR | Status: AC
  Administered 2021-08-09: 80 mg
  Filled 2021-08-09: qty 2

## 2021-08-09 MED ORDER — ROPIVACAINE HCL 2 MG/ML IJ SOLN
18.0000 mL | Freq: Once | INTRAMUSCULAR | Status: AC
  Administered 2021-08-09: 18 mL via PERINEURAL

## 2021-08-09 MED ORDER — LIDOCAINE HCL 2 % IJ SOLN
20.0000 mL | Freq: Once | INTRAMUSCULAR | Status: AC
  Administered 2021-08-09: 200 mg

## 2021-08-09 NOTE — Progress Notes (Signed)
Safety precautions to be maintained throughout the outpatient stay will include: orient to surroundings, keep bed in low position, maintain call bell within reach at all times, provide assistance with transfer out of bed and ambulation.  

## 2021-08-09 NOTE — Patient Instructions (Signed)
Pain Management Discharge Instructions  General Discharge Instructions :  If you need to reach your doctor call: Monday-Friday 8:00 am - 4:00 pm at [phone] or toll free [phone].  After clinic hours [phone] to have operator reach doctor.  Bring all of your medication bottles to all your appointments in the pain clinic.  To cancel or reschedule your appointment with Pain Management please remember to call 24 hours in advance to avoid a fee.  Refer to the educational materials which you have been given on: General Risks, I had my Procedure. Discharge Instructions, Post Sedation.  Post Procedure Instructions:  The drugs you were given will stay in your system until tomorrow, so for the next 24 hours you should not drive, make any legal decisions or drink any alcoholic beverages.  You may eat anything you prefer, but it is better to start with liquids then soups and crackers, and gradually work up to solid foods.  Please notify your doctor immediately if you have any unusual bleeding, trouble breathing or pain that is not related to your normal pain.  Depending on the type of procedure that was done, some parts of your body may feel week and/or numb.  This usually clears up by tonight or the next day.  Walk with the use of an assistive device or accompanied by an adult for the 24 hours.  You may use ice on the affected area for the first 24 hours.  Put ice in a Ziploc bag and cover with a towel and place against area 15 minutes on 15 minutes off.  You may switch to heat after 24 hours.Facet Blocks Patient Information  Description: The facets are joints in the spine between the vertebrae.  Like any joints in the body, facets can become irritated and painful.  Arthritis can also effect the facets.  By injecting steroids and local anesthetic in and around these joints, we can temporarily block the nerve supply to them.  Steroids act directly on irritated nerves and tissues to  reduce selling and inflammation which often leads to decreased pain.  Facet blocks may be done anywhere along the spine from the neck to the low back depending upon the location of your pain.   After numbing the skin with local anesthetic (like Novocaine), a small needle is passed onto the facet joints under x-ray guidance.  You may experience a sensation of pressure while this is being done.  The entire block usually lasts about 15-25 minutes.   Conditions which may be treated by facet blocks:  Low back/buttock pain Neck/shoulder pain Certain types of headaches  Preparation for the injection:  Do not eat any solid food or dairy products within 8 hours of your appointment. You may drink clear liquid up to 3 hours before appointment.  Clear liquids include water, black coffee, juice or soda.  No milk or cream please. You may take your regular medication, including pain medications, with a sip of water before your appointment.  Diabetics should hold regular insulin (if taken separately) and take 1/2 normal NPH dose the morning of the procedure.  Carry some sugar containing items with you to your appointment. A driver must accompany you and be prepared to drive you home after your procedure. Bring all your current medications with you. An IV may be inserted and sedation may be given at the discretion of the physician. A blood pressure cuff, EKG and other monitors will often be applied during the procedure.  Some patients may need to   have extra oxygen administered for a short period. You will be asked to provide medical information, including your allergies and medications, prior to the procedure.  We must know immediately if you are taking blood thinners (like Coumadin/Warfarin) or if you are allergic to IV iodine contrast (dye).  We must know if you could possible be pregnant.  Possible side-effects:  Bleeding from needle site Infection (rare, may require surgery) Nerve injury (rare) Numbness  & tingling (temporary) Difficulty urinating (rare, temporary) Spinal headache (a headache worse with upright posture) Light-headedness (temporary) Pain at injection site (serveral days) Decreased blood pressure (rare, temporary) Weakness in arm/leg (temporary) Pressure sensation in back/neck (temporary)   Call if you experience:  Fever/chills associated with headache or increased back/neck pain Headache worsened by an upright position New onset, weakness or numbness of an extremity below the injection site Hives or difficulty breathing (go to the emergency room) Inflammation or drainage at the injection site(s) Severe back/neck pain greater than usual New symptoms which are concerning to you  Please note:  Although the local anesthetic injected can often make your back or neck feel good for several hours after the injection, the pain will likely return. It takes 3-7 days for steroids to work.  You may not notice any pain relief for at least one week.  If effective, we will often do a series of 2-3 injections spaced 3-6 weeks apart to maximally decrease your pain.  After the initial series, you may be a candidate for a more permanent nerve block of the facets.  If you have any questions, please call [phone] [facility] 

## 2021-08-09 NOTE — Telephone Encounter (Signed)
LMTCB in regards to lab results.  

## 2021-08-10 ENCOUNTER — Telehealth: Payer: Self-pay | Admitting: *Deleted

## 2021-08-10 NOTE — Telephone Encounter (Signed)
No problems post procedure. 

## 2021-08-30 NOTE — Progress Notes (Deleted)
Patient called indicating that she tested positive for COVID.  She has canceled today's appointment.

## 2021-08-31 ENCOUNTER — Encounter: Payer: Medicare Other | Admitting: Pain Medicine

## 2021-08-31 ENCOUNTER — Other Ambulatory Visit: Payer: Self-pay | Admitting: Pain Medicine

## 2021-08-31 DIAGNOSIS — Z91199 Patient's noncompliance with other medical treatment and regimen due to unspecified reason: Secondary | ICD-10-CM

## 2021-08-31 DIAGNOSIS — M542 Cervicalgia: Secondary | ICD-10-CM | POA: Insufficient documentation

## 2021-08-31 DIAGNOSIS — Z5329 Procedure and treatment not carried out because of patient's decision for other reasons: Secondary | ICD-10-CM

## 2021-08-31 NOTE — Progress Notes (Signed)
The patient called and cancel today's appointment (08/31/2021) indicating that she has COVID-19. On 07/27/2021 I sent prescriptions to the patient's pharmacy for the hydrocodone/APAP 5/325 (# 90) to be taken 1 tablet p.o. 3 times daily with enough prescriptions to last until 11/27/2021.  The patient's last UDS was done on 06/01/2021 and it was compliant with therapy.

## 2021-09-07 ENCOUNTER — Other Ambulatory Visit: Payer: Self-pay | Admitting: Internal Medicine

## 2021-09-07 DIAGNOSIS — M7918 Myalgia, other site: Secondary | ICD-10-CM

## 2021-09-08 MED ORDER — ROSUVASTATIN CALCIUM 20 MG PO TABS: 20.0000 mg | ORAL_TABLET | Freq: Every day | ORAL | 1 refills | Status: DC

## 2021-09-08 NOTE — Addendum Note (Signed)
Addended by: Nanci Pina on: 09/08/2021 08:54 AM   Modules accepted: Orders

## 2021-09-08 NOTE — Addendum Note (Signed)
Addended by: Nanci Pina on: 09/08/2021 10:16 AM   Modules accepted: Orders

## 2021-09-15 ENCOUNTER — Other Ambulatory Visit: Payer: Self-pay | Admitting: Internal Medicine

## 2021-10-06 ENCOUNTER — Ambulatory Visit: Payer: Medicare Other | Attending: Pain Medicine | Admitting: Pain Medicine

## 2021-10-06 ENCOUNTER — Other Ambulatory Visit: Payer: Self-pay

## 2021-10-06 VITALS — BP 127/68 | HR 51 | Temp 97.0°F | Resp 16 | Ht 64.0 in | Wt 177.0 lb

## 2021-10-06 DIAGNOSIS — G8929 Other chronic pain: Secondary | ICD-10-CM

## 2021-10-06 DIAGNOSIS — M25561 Pain in right knee: Secondary | ICD-10-CM | POA: Diagnosis not present

## 2021-10-06 DIAGNOSIS — M1711 Unilateral primary osteoarthritis, right knee: Secondary | ICD-10-CM | POA: Insufficient documentation

## 2021-10-06 MED ORDER — TRIAMCINOLONE ACETONIDE 32 MG IX SRER
32.0000 mg | Freq: Once | INTRA_ARTICULAR | Status: AC
  Administered 2021-10-06: 32 mg via INTRA_ARTICULAR
  Filled 2021-10-06: qty 5

## 2021-10-06 MED ORDER — PENTAFLUOROPROP-TETRAFLUOROETH EX AERO
INHALATION_SPRAY | Freq: Once | CUTANEOUS | Status: DC
  Filled 2021-10-06: qty 116

## 2021-10-06 MED ORDER — ROPIVACAINE HCL 2 MG/ML IJ SOLN
4.0000 mL | Freq: Once | INTRAMUSCULAR | Status: AC
  Administered 2021-10-06: 20 mL via INTRA_ARTICULAR

## 2021-10-06 MED ORDER — LIDOCAINE HCL (PF) 1 % IJ SOLN
5.0000 mL | Freq: Once | INTRAMUSCULAR | Status: AC
  Administered 2021-10-06: 5 mL
  Filled 2021-10-06: qty 5

## 2021-10-06 MED ORDER — ROPIVACAINE HCL 2 MG/ML IJ SOLN
INTRAMUSCULAR | Status: AC
  Filled 2021-10-06: qty 20

## 2021-10-06 NOTE — Progress Notes (Signed)
Safety precautions to be maintained throughout the outpatient stay will include: orient to surroundings, keep bed in low position, maintain call bell within reach at all times, provide assistance with transfer out of bed and ambulation.    Patient did not bring medications with her today. Patient states she has about 12 pills remaining.

## 2021-10-06 NOTE — Patient Instructions (Signed)

## 2021-10-06 NOTE — Progress Notes (Addendum)
PROVIDER NOTE: Interpretation of information contained herein should be left to medically-trained personnel. Specific patient instructions are provided elsewhere under "Patient Instructions" section of medical record. This document was created in part using STT-dictation technology, any transcriptional errors that may result from this process are unintentional.  Patient: Wanda Martinez Service: Procedure Provider: Gaspar Cola, MD Type: Established DOS: 10/06/2021 Specialty: Interventional PM DOB: [birth year] Setting: Ambulatory Specialty designation: 09 MRN: [id number] Location: Ambulatory outpatient facility Location: Outpatient facility PCP: Einar Pheasant, MD Delivery: Face-to-face Ref. Prov.: Einar Pheasant, MD  Procedure [facility] Interventional Pain Management )    Procedure: ER-steroid ([place]) Intra-articular Knee Injection  No.: n/a  Series: n/a Level/approach: Lateral Laterality: Right (-RT) Imaging guidance: None required ([id number]) Analgesia: Local anesthesia Sedation: None.   Purpose: Diagnostic/Therapeutic Indications: Knee arthralgia associated to osteoarthritis of the knee  NAS-11 score:   Pre-procedure: 7 /10   Post-procedure: 7 /10     1. Chronic knee pain (Right)   2. Osteoarthritis of knee (Right)    Pre-Procedure Preparation  Monitoring: As per clinic protocol.  Risk Assessment: Vitals:  [id number] body mass index is 30.38 kg/m as calculated from the following:   Height as of this encounter: 5\' 4"  (1.626 m).   Weight as of this encounter: 177 lb (80.3 kg)., Rate:(!) 51 , BP:127/68, Resp:16, Temp:(!) 97 F (36.1 C), SpO2:97 %  Allergies: She is allergic to sulfa antibiotics and vesicare [solifenacin].  Precautions: None required  Blood-thinner(s): None at this time  Coagulopathies: Reviewed. None identified.   Active Infection(s): Reviewed. None identified. Wanda Martinez is afebrile   Location setting: Exam room Position: Sitting w/ knee bent  90 degrees Safety Precautions: Patient was assessed for positional comfort and pressure points before starting the procedure. Prepping solution: DuraPrep (Iodine Povacrylex [0.7% available iodine] and Isopropyl Alcohol, 74% w/w) Prep Area: Entire knee region Approach: percutaneous, just above the tibial plateau, lateral to the infrapatellar tendon. Intended target: Intra-articular knee space Materials: Tray: Block Needle(s): Regular Qty: 1/side Length: 1.5-inch Gauge: 25G   Meds ordered this encounter  Medications   DISCONTD: pentafluoroprop-tetrafluoroeth (GEBAUERS) aerosol   ropivacaine (PF) 2 mg/mL (0.2%) (NAROPIN) injection 4 mL   lidocaine (PF) (XYLOCAINE) 1 % injection 5 mL   Triamcinolone Acetonide SRER 32 mg    Maintain refrigerated.  Prepared suspension may be stored up to 4 hours at ambient conditions.    Orders Placed This Encounter  Procedures   KNEE INJECTION    Local Anesthetic & Steroid injection.    Scheduling Instructions:     Side(s): Right Knee     Sedation: None     Timeframe: Today    Order Specific Question:   Where will this procedure be performed?    Answer:   [facility]   Informed Consent Details: Physician/Practitioner Attestation; Transcribe to consent form and obtain patient signature    Note: Always confirm laterality of pain with Wanda Martinez, before procedure. Transcribe to consent form and obtain patient signature.    Order Specific Question:   Physician/Practitioner attestation of informed consent for procedure/surgical case    Answer:   I, the physician/practitioner, attest that I have discussed with the patient the benefits, risks, side effects, alternatives, likelihood of achieving goals and potential problems during recovery for the procedure that I have provided informed consent.    Order Specific Question:   Procedure    Answer:   Right-sided intra-articular knee arthrocentesis (aspiration and/or injection)    Order Specific  Question:   Physician/Practitioner performing  the procedure    Answer:   Elisah Parmer A. Dossie Arbour, MD    Order Specific Question:   Indication/Reason    Answer:   Chronic right-sided knee pain secondary to knee arthropathy/arthralgia     Time-out: 1212 I initiated and conducted the "Time-out" before starting the procedure, as per protocol. The patient was asked to participate by confirming the accuracy of the "Time Out" information. Verification of the correct person, site, and procedure were performed and confirmed by me, the nursing staff, and the patient. "Time-out" conducted as per Joint Commission's Universal Protocol (UP.01.01.01). Procedure checklist: Completed   H&P (Pre-op  Assessment)  Wanda Martinez is a 86 y.o. (year old), female patient, seen today for interventional treatment. She  has a past surgical history that includes Tonsillectomy and adenoidectomy (81); Vesicovaginal fistula closure w/ TAH (1983); Breast surgery (1986); Breast enhancement surgery (1987); Breast implant removal; Breast implant removal (Right, 08/29/2012); Mastectomy (08/2012); Abdominal hysterectomy; Colonoscopy with propofol (N/A, 09/13/2016); and Breast biopsy (2013). Wanda Martinez has a current medication list which includes the following prescription(s): amlodipine, aspirin, calcium carbonate, donepezil, glucosamine sulfate, glucosamine-chondroitin, hydrochlorothiazide, letrozole, losartan, lubiprostone, magnesium oxide, melatonin, meloxicam, metoprolol succinate, multivitamin, nitrofurantoin (macrocrystal-monohydrate), omega-3, omeprazole, pantoprazole, citalopram, cyclobenzaprine, gabapentin, hydrocodone-acetaminophen, magnesium oxide, and rosuvastatin. Her primarily concern today is the Knee Pain (right)   She is allergic to sulfa antibiotics and vesicare [solifenacin].   Last encounter: My last encounter with her was on 08/31/2021. Pertinent problems: Wanda Martinez has Primary cancer of upper inner quadrant of right female  breast ([place]); Headache; Parkinson's disease ([place]); Chronic low back pain (1ry area of Pain) (Bilateral) (R>L) w/o sciatica; Lumbar spondylosis; Chronic hip pain (Bilateral); Chronic neck pain; Cervical spondylosis; Chronic cervical radicular pain (Right); Diffuse myofascial pain syndrome; Neurogenic pain; Chronic upper back pain (Right); Myofascial pain syndrome (Right) (cervicothoracic); Lumbar facet syndrome (Bilateral) (R>L); Malignant neoplasm of right female breast ([place]); Cervical facet hypertrophy; Cervical facet syndrome ([place]); Chronic shoulder pain (Right); Chronic pain syndrome; Chronic sacroiliac joint pain (Left); Chronic sacroiliac joint pain (Right); 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, L4-5, L5-S1) (Right); Lumbar spinal stenosis (with neurogenic claudication) (L3-4); Chronic lower extremity pain (2ry area of Pain) (Right); Chronic lumbar radicular pain (S1) (Right); Trochanteric bursitis of hip (Bilateral); Spondylosis without myelopathy or radiculopathy, lumbosacral region; Trigger point with back pain (Right); DDD (degenerative disc disease), lumbosacral; Chronic upper extremity pain (Right); Chronic thoracic back pain (Bilateral) (L>R); Trigger point of thoracic region (Bilateral) (L>R); Other specified dorsopathies, sacral and sacrococcygeal region; Sacroiliac joint dysfunction (Right); Osteoarthritis of sacroiliac joint (Right); Somatic dysfunction of sacroiliac joint (Right); Chronic musculoskeletal pain; Facial pain; Chronic hip pain (Right); Osteoarthritis of hip (Right); Left ear pain; 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 Pain Assessment: Severity of Chronic pain is reported as a 7 /10. Location: Knee Right/inside of knee. Onset: More than a month ago. Quality: Aching, Discomfort. Timing: Constant. Modifying factor(s): prolonged walking and standing, bending. Vitals:  height is 5\' 4"  (1.626 m) and weight is 177 lb (80.3 kg). Her temperature is 97 F (36.1 C) (abnormal). Her blood pressure is 127/68 and her pulse is 51 (abnormal). Her respiration is 16 and  oxygen saturation is 97%.   Reason for encounter: "interventional pain management therapy due pain of at least four (4) weeks in duration, with failure to respond and/or inability to tolerate more conservative care.   Related imaging: Knee-L MR wo contrast:  No results found for this or any previous visit.  Knee-R MR wo contrast:  No results found for this or any previous visit.  Knee-R DG 1-2 views:  Results for orders placed during the hospital encounter of 03/15/21  DG Knee 1-2 Views Right  Narrative CLINICAL DATA:  Fall, right knee pain  EXAM: RIGHT KNEE - 1-2 VIEW  COMPARISON:  None.  FINDINGS: Two view radiograph right knee demonstrates normal alignment. No fracture or dislocation. Minimal osteophyte formation is present in keeping with at least mild tricompartmental degenerative arthritis. No effusion. Soft tissues are unremarkable.  IMPRESSION: Mild degenerative change.  No acute fracture or dislocation.   Electronically Signed By: Fidela Salisbury MD On: 03/15/2021 23:05  Knee-L DG 1-2 views:  No results found for this or any previous visit.  Knee-R DG 3 views:  No results found for this or any previous visit.  Knee-L DG 3 views:  No results found for this or any previous visit.  Knee-R DG 4 views:  No results found for this or any previous visit.  Knee-L DG 4 views:  No  results found for this or any previous visit.    Site Confirmation: Wanda Martinez was asked to confirm the procedure and laterality before marking the site.  Consent: Before the procedure and under the influence of no sedative(s), amnesic(s), or anxiolytics, the patient was informed of the treatment options, risks and possible complications. To fulfill our ethical and legal obligations, as recommended by the American Medical Association's Code of Ethics, I have informed the patient of my clinical impression; the nature and purpose of the treatment or procedure; the risks, benefits, and possible complications of the intervention; the alternatives, including doing nothing; the risk(s) and benefit(s) of the alternative treatment(s) or procedure(s); and the risk(s) and benefit(s) of doing nothing. The patient was provided information about the general risks and possible complications associated with the procedure. These may include, but are not limited to: failure to achieve desired goals, infection, bleeding, organ or nerve damage, allergic reactions, paralysis, and death. In addition, the patient was informed of those risks and complications associated to Spine-related procedures, such as failure to decrease pain; infection (i.e.: Meningitis, epidural or intraspinal abscess); bleeding (i.e.: epidural hematoma, subarachnoid hemorrhage, or any other type of intraspinal or peri-dural bleeding); organ or nerve damage (i.e.: Any type of peripheral nerve, nerve root, or spinal cord injury) with subsequent damage to sensory, motor, and/or autonomic systems, resulting in permanent pain, numbness, and/or weakness of one or several areas of the body; allergic reactions; (i.e.: anaphylactic reaction); and/or death. Furthermore, the patient was informed of those risks and complications associated with the medications. These include, but are not limited to: allergic reactions (i.e.: anaphylactic or anaphylactoid reaction(s));  adrenal axis suppression; blood sugar elevation that in diabetics may result in ketoacidosis or comma; water retention that in patients with history of congestive heart failure may result in shortness of breath, pulmonary edema, and decompensation with resultant heart failure; weight gain; swelling or edema; medication-induced neural toxicity; particulate matter embolism and blood vessel occlusion with resultant organ, and/or nervous system infarction; and/or aseptic necrosis of one or more joints. Finally, the patient was informed that Medicine is not an exact science; therefore, there is also  the possibility of unforeseen or unpredictable risks and/or possible complications that may result in a catastrophic outcome. The patient indicated having understood very clearly. We have given the patient no guarantees and we have made no promises. Enough time was given to the patient to ask questions, all of which were answered to the patient's satisfaction. Wanda Martinez has indicated that she wanted to continue with the procedure. Attestation: I, the ordering provider, attest that I have discussed with the patient the benefits, risks, side-effects, alternatives, likelihood of achieving goals, and potential problems during recovery for the procedure that I have provided informed consent.  Date  Time: 10/06/2021 11:29 AM   Prophylactic antibiotics  Anti-infectives (From admission, onward)    None      Indication(s): None identified   Description of procedure   Start Time: 1214 hrs  Local Anesthesia: Once the patient was positioned, prepped, and time-out was completed. The target area was identified located. The skin was marked with an approved surgical skin marker. Once marked, the skin (epidermis, dermis, and hypodermis), and deeper tissues (fat, connective tissue and muscle) were infiltrated with a small amount of a short-acting local anesthetic, loaded on a 10cc syringe with a 25G, 1.5-in  Needle. An  appropriate amount of time was allowed for local anesthetics to take effect before proceeding to the next step. Local Anesthetic: Lidocaine 1-2% The unused portion of the local anesthetic was discarded in the proper designated containers. Safety Precautions: Aspiration looking for blood return was conducted prior to all injections. At no point did I inject any substances, as a needle was being advanced. Before injecting, the patient was told to immediately notify me if she was experiencing any new onset of "ringing in the ears, or metallic taste in the mouth". No attempts were made at seeking any paresthesias. Safe injection practices and needle disposal techniques used. Medications properly checked for expiration dates. SDV (single dose vial) medications used. After the completion of the procedure, all disposable equipment used was discarded in the proper designated medical waste containers.  Technical description: Protocol guidelines were followed. After positioning, the target area was identified and prepped in the usual manner. Skin & deeper tissues infiltrated with local anesthetic. Appropriate amount of time allowed to pass for local anesthetics to take effect. Proper needle placement secured. Once satisfactory needle placement was confirmed, I proceeded to inject the desired solution in slow, incremental fashion, intermittently assessing for discomfort or any signs of abnormal or undesired spread of substance. Once completed, the needle was removed and disposed of, as per hospital protocols. The area was cleaned, making sure to leave some of the prepping solution back to take advantage of its long term bactericidal properties.  Aspiration:  Negative   Vitals:   10/06/21 1128  BP: 127/68  Pulse: (!) 51  Resp: 16  Temp: (!) 97 F (36.1 C)  SpO2: 97%  Weight: 177 lb (80.3 kg)  Height: 5\' 4"  (1.626 m)    End Time: 1218 hrs   Imaging guidance  Imaging-assisted Technique: None  required. Indication(s): N/A Exposure Time: N/A Contrast: None Fluoroscopic Guidance: N/A Ultrasound Guidance: N/A Interpretation: N/A   Post-op assessment  Post-procedure Vital Signs:  Pulse/HCG Rate: (!) 51  Temp: (!) 97 F (36.1 C) Resp: 16 BP: 127/68 SpO2: 97 %  EBL: None  Complications: No immediate post-treatment complications observed by team, or reported by patient.  Note: The patient tolerated the entire procedure well. A repeat set of vitals were taken after the procedure and  the patient was kept under observation following institutional policy, for this type of procedure. Post-procedural neurological assessment was performed, showing return to baseline, prior to discharge. The patient was provided with post-procedure discharge instructions, including a section on how to identify potential problems. Should any problems arise concerning this procedure, the patient was given instructions to immediately contact us, at any time, without hesitation. In any case, we plan to contact the patient by telephone for a follow-up status report regarding this interventional procedure.  Comments:  No additional relevant information.   Plan of care  Chronic Opioid Analgesic:  Hydrocodone/APAP 5/325, 1 tab PO q 8 hrs (15 mg/day of hydrocodone) MME/day: 15 mg/day.   Medications administered: We administered ropivacaine (PF) 2 mg/mL (0.2%), lidocaine (PF), and Triamcinolone Acetonide.  Follow-up plan:   Return in about 2 weeks (around 10/20/2021) for Proc-day (T,Th), (VV), (PPE).      Interventional Therapies  Risk  Complexity Considerations:   Estimated body mass index is 31.41 kg/m as calculated from the following:   Height as of this encounter: 5\' 4"  (1.626 m).   Weight as of this encounter: 183 lb (83 kg). WNL   Planned  Pending:   Palliative bilateral lumbar facet block    Under consideration:   Therapeutic bilateral lumbar facet RFA  Diagnostic right genicular nerve  blocks  Therapeutic right genicular nerve RFA  Diagnostic bilateral cervical facet block  Possible bilateral cervical facet RFA    Completed:   Therapeutic right superior/lateral trapezius muscle TPI x1 (04/19/2021)  Therapeutic right thoracic back TPI x2 (10/17/2018)  Therapeutic left thoracic back TPI x1 (10/17/2018) Therapeutic right quadratus lumborum and erector spinae muscle TPI x1 (02/12/2018) Therapeutic right lumbar facet block x8 (03/15/2021)  Therapeutic left lumbar facet block x4 (03/15/2021)  Therapeutic right lumbar facet RFA x2 (06/04/2018) (100/90/75/75-100) Therapeutic left lumbar facet RFA x1 (02/12/2018) (100/60/90/>75) Diagnostic right SI joint block x3 (12/21/2020)  Diagnostic left SI joint block x1 (08/09/2017)  Therapeutic right SI joint RFA x3 (06/05/2019)  Therapeutic midline L2-3 LESI x1 (10/04/2017) (100/100/85/>50) Therapeutic right L3-4 LESI x2 (11/04/2020) Therapeutic right L4-5 LESI x1 (05/20/2020) Therapeutic right L5-S1 LESI x2 (12/04/2019) Therapeutic right L5 TFESI x3 (12/04/2019)  Therapeutic right S1 TFESI x1 (11/04/2020) Diagnostic bilateral superficial trochanteric bursa injection x1 (04/30/2018)  Diagnostic right IA hip injection x1 (02/13/2019)  Diagnostic/therapeutic right IA knee injection (steroid) x1 (04/19/2021)    Palliative options:   Palliative bilateral lumbar facet RFA  Palliative bilateral lumbar facet blocks         Recent Visits Date Type Provider Dept  12/26/21 Office Visit Milinda Pointer, MD [facility]  10/06/21 Procedure visit Milinda Pointer, MD [facility]  Showing recent visits within past 90 days and meeting all other requirements Future Appointments No visits were found meeting these conditions. Showing future appointments within next 90 days and meeting all other requirements   Disposition: Discharge home  Discharge (Date  Time): 10/06/2021; 1220 hrs.   Primary Care Physician: Einar Pheasant,  MD Location: [facility] Note by: Gaspar Cola, MD Date: 10/06/2021; Time: 6:54 AM  DISCLAIMER: Medicine is not an Chief Strategy Officer. It has no guarantees or warranties. The decision to proceed with this intervention was based on the information collected from the patient. Conclusions were drawn from the patient's questionnaire, interview, and examination. Because information was provided in large part by the patient, it cannot be guaranteed that it has not been purposely or unconsciously manipulated or altered. Every effort has  been made to obtain as much accurate, relevant, available data as possible. Always take into account that the treatment will also be dependent on availability of resources and existing treatment guidelines, considered by other Pain Management Specialists as being common knowledge and practice, at the time of the intervention. It is also important to point out that variation in procedural techniques and pharmacological choices are the acceptable norm. For Medico-Legal review purposes, the indications, contraindications, technique, and results of the these procedures should only be evaluated, judged and interpreted by a Board-Certified Interventional Pain Specialist with extensive familiarity and expertise in the same exact procedure and technique.

## 2021-10-07 ENCOUNTER — Other Ambulatory Visit: Payer: Self-pay | Admitting: Internal Medicine

## 2021-10-07 DIAGNOSIS — M7918 Myalgia, other site: Secondary | ICD-10-CM

## 2021-10-11 ENCOUNTER — Other Ambulatory Visit: Payer: Self-pay | Admitting: Internal Medicine

## 2021-10-11 DIAGNOSIS — E7800 Pure hypercholesterolemia, unspecified: Secondary | ICD-10-CM

## 2021-10-13 ENCOUNTER — Other Ambulatory Visit: Payer: Self-pay

## 2021-10-13 ENCOUNTER — Other Ambulatory Visit (INDEPENDENT_AMBULATORY_CARE_PROVIDER_SITE_OTHER): Payer: Medicare Other

## 2021-10-13 DIAGNOSIS — E7800 Pure hypercholesterolemia, unspecified: Secondary | ICD-10-CM

## 2021-10-13 LAB — HEPATIC FUNCTION PANEL
ALT: 16 U/L (ref 0–35)
AST: 22 U/L (ref 0–37)
Albumin: 4.1 g/dL (ref 3.5–5.2)
Alkaline Phosphatase: 28 U/L — ABNORMAL LOW (ref 39–117)
Bilirubin, Direct: 0 mg/dL (ref 0.0–0.3)
Total Bilirubin: 0.4 mg/dL (ref 0.2–1.2)
Total Protein: 6.9 g/dL (ref 6.0–8.3)

## 2021-10-14 ENCOUNTER — Telehealth: Payer: Self-pay

## 2021-10-14 NOTE — Telephone Encounter (Signed)
-----   Message from Einar Pheasant, MD sent at 10/14/2021  5:21 AM EDT ----- Notify - liver function tests are wnl.

## 2021-10-28 ENCOUNTER — Ambulatory Visit: Payer: Medicare Other | Admitting: Internal Medicine

## 2021-11-02 DIAGNOSIS — H02831 Dermatochalasis of right upper eyelid: Secondary | ICD-10-CM | POA: Diagnosis not present

## 2021-11-02 DIAGNOSIS — H43812 Vitreous degeneration, left eye: Secondary | ICD-10-CM | POA: Diagnosis not present

## 2021-11-07 ENCOUNTER — Other Ambulatory Visit: Payer: Self-pay | Admitting: Internal Medicine

## 2021-11-07 ENCOUNTER — Other Ambulatory Visit: Payer: Self-pay

## 2021-11-07 ENCOUNTER — Ambulatory Visit (INDEPENDENT_AMBULATORY_CARE_PROVIDER_SITE_OTHER): Payer: Medicare Other | Admitting: Internal Medicine

## 2021-11-07 VITALS — BP 122/70 | HR 66 | Temp 97.3°F | Resp 16 | Ht 64.0 in | Wt 181.0 lb

## 2021-11-07 DIAGNOSIS — F32A Depression, unspecified: Secondary | ICD-10-CM

## 2021-11-07 DIAGNOSIS — G20 Parkinson's disease: Secondary | ICD-10-CM

## 2021-11-07 DIAGNOSIS — M7918 Myalgia, other site: Secondary | ICD-10-CM

## 2021-11-07 DIAGNOSIS — I700 Atherosclerosis of aorta: Secondary | ICD-10-CM

## 2021-11-07 DIAGNOSIS — N1831 Chronic kidney disease, stage 3a: Secondary | ICD-10-CM

## 2021-11-07 DIAGNOSIS — D649 Anemia, unspecified: Secondary | ICD-10-CM | POA: Diagnosis not present

## 2021-11-07 DIAGNOSIS — I10 Essential (primary) hypertension: Secondary | ICD-10-CM | POA: Diagnosis not present

## 2021-11-07 DIAGNOSIS — E875 Hyperkalemia: Secondary | ICD-10-CM

## 2021-11-07 DIAGNOSIS — M810 Age-related osteoporosis without current pathological fracture: Secondary | ICD-10-CM | POA: Diagnosis not present

## 2021-11-07 DIAGNOSIS — E7800 Pure hypercholesterolemia, unspecified: Secondary | ICD-10-CM

## 2021-11-07 DIAGNOSIS — E871 Hypo-osmolality and hyponatremia: Secondary | ICD-10-CM

## 2021-11-07 DIAGNOSIS — K219 Gastro-esophageal reflux disease without esophagitis: Secondary | ICD-10-CM

## 2021-11-07 DIAGNOSIS — C50211 Malignant neoplasm of upper-inner quadrant of right female breast: Secondary | ICD-10-CM | POA: Diagnosis not present

## 2021-11-07 DIAGNOSIS — R739 Hyperglycemia, unspecified: Secondary | ICD-10-CM | POA: Diagnosis not present

## 2021-11-07 LAB — BASIC METABOLIC PANEL
BUN: 16 mg/dL (ref 6–23)
CO2: 31 mEq/L (ref 19–32)
Calcium: 9.7 mg/dL (ref 8.4–10.5)
Chloride: 97 mEq/L (ref 96–112)
Creatinine, Ser: 1.05 mg/dL (ref 0.40–1.20)
GFR: 48.95 mL/min — ABNORMAL LOW (ref >60.00)
Glucose, Bld: 85 mg/dL (ref 70–99)
Potassium: 5 mEq/L (ref 3.5–5.1)
Sodium: 134 mEq/L — ABNORMAL LOW (ref 135–145)

## 2021-11-07 LAB — LIPID PANEL
Cholesterol: 120 mg/dL (ref 0–200)
HDL: 44.1 mg/dL (ref >39.00)
LDL Cholesterol: 51 mg/dL (ref 0–99)
NonHDL: 76
Total CHOL/HDL Ratio: 3
Triglycerides: 123 mg/dL (ref 0.0–149.0)
VLDL: 24.6 mg/dL (ref 0.0–40.0)

## 2021-11-07 LAB — CBC WITH DIFFERENTIAL/PLATELET
Basophils Absolute: 0.1 10*3/uL (ref 0.0–0.1)
Basophils Relative: 1.3 % (ref 0.0–3.0)
Eosinophils Absolute: 0.3 10*3/uL (ref 0.0–0.7)
Eosinophils Relative: 4.3 % (ref 0.0–5.0)
HCT: 35.3 % — ABNORMAL LOW (ref 36.0–46.0)
Hemoglobin: 11.6 g/dL — ABNORMAL LOW (ref 12.0–15.0)
Lymphocytes Relative: 21.1 % (ref 12.0–46.0)
Lymphs Abs: 1.5 10*3/uL (ref 0.7–4.0)
MCHC: 32.8 g/dL (ref 30.0–36.0)
MCV: 92 fl (ref 78.0–100.0)
Monocytes Absolute: 0.7 10*3/uL (ref 0.1–1.0)
Monocytes Relative: 9.3 % (ref 3.0–12.0)
Neutro Abs: 4.5 10*3/uL (ref 1.4–7.7)
Neutrophils Relative %: 64 % (ref 43.0–77.0)
Platelets: 241 10*3/uL (ref 150.0–400.0)
RBC: 3.84 Mil/uL — ABNORMAL LOW (ref 3.87–5.11)
RDW: 13.9 % (ref 11.5–15.5)
WBC: 7.1 10*3/uL (ref 4.0–10.5)

## 2021-11-07 LAB — HEPATIC FUNCTION PANEL
ALT: 15 U/L (ref 0–35)
AST: 22 U/L (ref 0–37)
Albumin: 4.3 g/dL (ref 3.5–5.2)
Alkaline Phosphatase: 37 U/L — ABNORMAL LOW (ref 39–117)
Bilirubin, Direct: 0.1 mg/dL (ref 0.0–0.3)
Total Bilirubin: 0.4 mg/dL (ref 0.2–1.2)
Total Protein: 7.2 g/dL (ref 6.0–8.3)

## 2021-11-07 LAB — VITAMIN B12: Vitamin B-12: 653 pg/mL (ref 211–911)

## 2021-11-07 LAB — FERRITIN: Ferritin: 107 ng/mL (ref 10.0–291.0)

## 2021-11-07 LAB — HEMOGLOBIN A1C: Hgb A1c MFr Bld: 5.8 % (ref 4.6–6.5)

## 2021-11-07 MED ORDER — GABAPENTIN 300 MG PO CAPS: ORAL_CAPSULE | ORAL | 2 refills | Status: DC

## 2021-11-07 NOTE — Progress Notes (Signed)
Patient ID: Wanda Martinez, female   DOB: 09/25/1935, 86 y.o.   MRN: [id number]   Subjective:    Patient ID: Wanda Martinez, female    DOB: January 07, 1935, 86 y.o.   MRN: [id number]  This visit occurred during the SARS-CoV-2 public health emergency.  Safety protocols were in place, including screening questions prior to the visit, additional usage of staff PPE, and extensive cleaning of exam room while observing appropriate contact time as indicated for disinfecting solutions.   Patient here for a scheduled follow up.    Marland Kitchen   HPI Here to follow up regarding her blood pressure and cholesterol.  Cholesterol medication was changed to crestor.  She is tolerating.  Pain (back and knee pain) limits her activity.  She is able to get around her house and do most of her ADLs.  Her husband does help her with mopping and vacuuming.  Overall she feels is stable.  No chest pain.  Breathing stable.  No acid reflux reported.  No abdominal pain.  Bowels moving.  Taking gabapentin.  Ask if I could take over prescribing.  Has been stable.  Blood pressure ok.     Past Medical History:  Diagnosis Date   Acute postoperative pain 10/25/2017   Anemia    Arm pain 07/26/2015   Arthritis    Arthritis, degenerative 03/26/2014   Back pain 11/01/2013   Bladder infection 06/2018   Breast cancer ([place])    Masectomy - left - 1986    Breast cancer [facility])    Mastectomy-right -2014   CHEST PAIN 04/29/2010   Qualifier: Diagnosis of  By: Wynetta Emery RN, Erika     Chronic cystitis    Cystocele 02/22/2013   Cystocele, midline 08/19/2013   Degeneration of intervertebral disc of lumbosacral region 03/26/2014   DYSPNEA 04/29/2010   Qualifier: Diagnosis of  By: Wynetta Emery RN, Erika     Enthesopathy of hip 03/26/2014   GERD (gastroesophageal reflux disease)    Hiatal hernia    HTN (hypertension)    Hypokalemia 06/2018   Hyponatremia 06/2018   LBP (low back pain) 03/26/2014   Neck pain 11/01/2013   Parkinson disease ([place])    Pneumonia  06/2018   Sinusitis 02/07/2015   Skin lesions 07/12/2014   Urinary incontinence    mixed    Past Surgical History:  Procedure Laterality Date   ABDOMINAL HYSTERECTOMY     BREAST BIOPSY  2013   BREAST ENHANCEMENT SURGERY  1987   BREAST IMPLANT REMOVAL     BREAST IMPLANT REMOVAL Right 08/29/2012   BREAST SURGERY  1986   s/p left mastectomy   COLONOSCOPY WITH PROPOFOL N/A 09/13/2016   Procedure: COLONOSCOPY WITH PROPOFOL;  Surgeon: Manya Silvas, MD;  Location: [facility];  Service: Endoscopy;  Laterality: N/A;   MASTECTOMY  08/2012   right   TONSILLECTOMY AND ADENOIDECTOMY  109   VESICOVAGINAL FISTULA CLOSURE W/ TAH  1983   Family History  Problem Relation Age of Onset   Diabetes Father    Stroke Father    Colon polyps Father    Stroke Mother    Parkinson's disease Mother    Social History   Socioeconomic History   Marital status: Married    Spouse name: Not on file   Number of children: 3   Years of education: Not on file   Highest education level: Not on file  Occupational History   Not on file  Tobacco Use   Smoking status: Never   Smokeless  tobacco: Never   Tobacco comments:    tobacco use - no  Vaping Use   Vaping Use: Never used  Substance and Sexual Activity   Alcohol use: No    Alcohol/week: 0.0 standard drinks   Drug use: No   Sexual activity: Never  Other Topics Concern   Not on file  Social History Narrative   Not on file   Social Determinants of Health   Financial Resource Strain: Not on file  Food Insecurity: Not on file  Transportation Needs: Not on file  Physical Activity: Not on file  Stress: Not on file  Social Connections: Not on file     Review of Systems  Constitutional:  Negative for appetite change and unexpected weight change.  HENT:  Negative for congestion and sinus pressure.   Respiratory:  Negative for cough, chest tightness and shortness of breath.   Cardiovascular:  Negative for chest pain, palpitations and leg  swelling.  Gastrointestinal:  Negative for abdominal pain, diarrhea, nausea and vomiting.  Genitourinary:  Negative for difficulty urinating and dysuria.  Musculoskeletal:  Positive for back pain. Negative for myalgias.  Skin:  Negative for color change and rash.  Neurological:  Negative for dizziness, light-headedness and headaches.  Psychiatric/Behavioral:  Negative for agitation and dysphoric mood.       Objective:     BP 122/70   Pulse 66   Temp (!) 97.3 F (36.3 C)   Resp 16   Ht $R'5\' 4"'Ik$  (1.626 m)   Wt 181 lb (82.1 kg)   LMP 12/18/1981   SpO2 98%   BMI 31.07 kg/m  Wt Readings from Last 3 Encounters:  11/07/21 181 lb (82.1 kg)  10/06/21 177 lb (80.3 kg)  08/09/21 178 lb (80.7 kg)    Physical Exam Vitals reviewed.  Constitutional:      General: She is not in acute distress.    Appearance: Normal appearance.  HENT:     Head: Normocephalic and atraumatic.     Right Ear: External ear normal.     Left Ear: External ear normal.  Eyes:     General: No scleral icterus.       Right eye: No discharge.        Left eye: No discharge.     Conjunctiva/sclera: Conjunctivae normal.  Neck:     Thyroid: No thyromegaly.  Cardiovascular:     Rate and Rhythm: Normal rate and regular rhythm.  Pulmonary:     Effort: No respiratory distress.     Breath sounds: Normal breath sounds. No wheezing.  Abdominal:     General: Bowel sounds are normal.     Palpations: Abdomen is soft.     Tenderness: There is no abdominal tenderness.  Musculoskeletal:        General: No swelling or tenderness.     Cervical back: Neck supple. No tenderness.  Lymphadenopathy:     Cervical: No cervical adenopathy.  Skin:    Findings: No erythema or rash.  Neurological:     Mental Status: She is alert.  Psychiatric:        Mood and Affect: Mood normal.        Behavior: Behavior normal.     Outpatient Encounter Medications as of 11/07/2021  Medication Sig   amLODipine (NORVASC) 2.5 MG tablet TAKE 1  TABLET BY MOUTH ONCE DAILY.   aspirin 81 MG EC tablet Take 81 mg by mouth daily as needed.   calcium carbonate (OSCAL) 1500 (600 Ca) MG TABS tablet  Take 1 tablet by mouth daily.   donepezil (ARICEPT) 5 MG tablet    gabapentin (NEURONTIN) 300 MG capsule Two tablets bid   GLUCOSAMINE SULFATE PO Take by mouth daily.    Glucosamine-Chondroitin 500-400 MG CAPS Take 2 capsules by mouth daily.   hydrochlorothiazide (HYDRODIURIL) 25 MG tablet TAKE 1 TABLET BY MOUTH ONCE DAILY. (PATIENT TAKES 1/2 ONCE DAILY AND EXTRA 1/2 IF NEEDED)   HYDROcodone-acetaminophen (NORCO/VICODIN) 5-325 MG tablet Take 1 tablet by mouth every 8 (eight) hours as needed for severe pain. Must last 30 days   HYDROcodone-acetaminophen (NORCO/VICODIN) 5-325 MG tablet Take 1 tablet by mouth every 8 (eight) hours as needed for severe pain. Must last 30 days   letrozole (FEMARA) 2.5 MG tablet Take 2.5 mg by mouth daily.   losartan (COZAAR) 50 MG tablet TAKE (2) TABLETS BY MOUTH ONCE DAILY.   lubiprostone (AMITIZA) 24 MCG capsule TAKE 1 CAPSULE BY MOUTH ONCE DAILY WITH BREAKFAST.   magnesium oxide (MAG-OX) 400 (241.3 Mg) MG tablet TAKE 1 TABLET BY MOUTH ONCE DAILY.   melatonin 1 MG TABS tablet Take 3 mg by mouth at bedtime.   meloxicam (MOBIC) 7.5 MG tablet TAKE 1 TABLET BY MOUTH ONCE DAILY.   metoprolol succinate (TOPROL-XL) 25 MG 24 hr tablet TAKE 1 TABLET BY MOUTH TWICE DAILY   Multiple Vitamin (MULTIVITAMIN) tablet Take 1 tablet by mouth daily.   nitrofurantoin, macrocrystal-monohydrate, (MACROBID) 100 MG capsule Take 1 capsule (100 mg total) by mouth daily.   Omega-3 1000 MG CAPS Take by mouth.   omeprazole (PRILOSEC) 20 MG capsule TAKE 1 CAPSULE BY MOUTH TWICE DAILY FOR REFLUX.   pantoprazole (PROTONIX) 40 MG tablet TAKE 1 TABLET BY MOUTH ONCE DAILY.   [DISCONTINUED] citalopram (CELEXA) 20 MG tablet TAKE 1 TABLET BY MOUTH ONCE DAILY.   [DISCONTINUED] cyclobenzaprine (FLEXERIL) 10 MG tablet TAKE ONE TABLET BY MOUTH AT BEDTIME.    [DISCONTINUED] gabapentin (NEURONTIN) 300 MG capsule Take by mouth.   [DISCONTINUED] rosuvastatin (CRESTOR) 20 MG tablet TAKE 1 TABLET BY MOUTH ONCE DAILY.   No facility-administered encounter medications on file as of 11/07/2021.     Lab Results  Component Value Date   WBC 7.1 11/07/2021   HGB 11.6 (L) 11/07/2021   HCT 35.3 (L) 11/07/2021   PLT 241.0 11/07/2021   GLUCOSE 85 11/07/2021   CHOL 120 11/07/2021   TRIG 123.0 11/07/2021   HDL 44.10 11/07/2021   LDLDIRECT 73.0 07/09/2018   LDLCALC 51 11/07/2021   ALT 15 11/07/2021   AST 22 11/07/2021   NA 134 (L) 11/07/2021   K 5.0 11/07/2021   CL 97 11/07/2021   CREATININE 1.05 11/07/2021   BUN 16 11/07/2021   CO2 31 11/07/2021   TSH 1.16 03/17/2021   HGBA1C 5.8 11/07/2021    DG PAIN CLINIC C-ARM 1-60 MIN NO REPORT  Result Date: 08/09/2021 Fluoro was used, but no Radiologist interpretation will be provided. Please refer to "NOTES" tab for provider progress note.      Assessment & Plan:   Problem List Items Addressed This Visit     Anemia    Follow cbc.  Followed by hematology.  Per hematology, no further w/up warranted.  If starts to decline, further w/up then.  Recheck today.       Relevant Orders   CBC with Differential/Platelet (Completed)   Ferritin (Completed)   B12 (Completed)   Aortic atherosclerosis ([place])    On crestor.  Low cholesterol diet and exercise.  Follow lipid panel and liver  function tests.        CKD (chronic kidney disease) stage 3, GFR 30-59 ml/min (HCC)    Avoid antiinflammatories.  Stay hdrated.  Follow metabolic panel.        Essential hypertension - Primary    Well controlled Continue current medications:  Hctz, metoprolol, amlodipine and losartan.  Recheck metabolic panel      Relevant Orders   Basic metabolic panel (Completed)   GERD (gastroesophageal reflux disease)    No upper symptoms reported. On protonix.       Hypercholesterolemia    On crestor.  Follow lipid panel and  liver function tests.        Hyperglycemia    Low carb diet and exercise.  Follow met b and a1c.       Relevant Orders   Hemoglobin A1c (Completed)   Hyponatremia    Recheck sodium today.       Mild depression    Continue citalopram.  Overall appears to be handling things well.  Follow.        Osteoporosis    prolia 07/2021.       Parkinsons ([place])    Has been followed by neurology.  Request me to refill gabapentin.  Follow.       Relevant Medications   gabapentin (NEURONTIN) 300 MG capsule   Primary cancer of upper inner quadrant of right female breast ([place])    On femara.  Followed by oncology.       Pure hypercholesterolemia    On crestor now.  Tolerating.  Low cholesterol diet and exercise.  Follow lipid panel and liver function tests.        Relevant Orders   Hepatic function panel (Completed)   Lipid panel (Completed)     Einar Pheasant, MD

## 2021-11-08 ENCOUNTER — Other Ambulatory Visit: Payer: Self-pay | Admitting: Internal Medicine

## 2021-11-08 ENCOUNTER — Encounter: Payer: Self-pay | Admitting: Internal Medicine

## 2021-11-08 DIAGNOSIS — R944 Abnormal results of kidney function studies: Secondary | ICD-10-CM

## 2021-11-08 NOTE — Assessment & Plan Note (Signed)
Avoid antiinflammatories.  Stay hdrated.  Follow metabolic panel.   

## 2021-11-08 NOTE — Assessment & Plan Note (Signed)
Recheck sodium today.  

## 2021-11-08 NOTE — Assessment & Plan Note (Signed)
Has been followed by neurology.  Request me to refill gabapentin.  Follow.

## 2021-11-08 NOTE — Assessment & Plan Note (Signed)
On crestor now.  Tolerating.  Low cholesterol diet and exercise.  Follow lipid panel and liver function tests.

## 2021-11-08 NOTE — Assessment & Plan Note (Signed)
On crestor.  Follow lipid panel and liver function tests.   

## 2021-11-08 NOTE — Assessment & Plan Note (Signed)
prolia 07/2021.

## 2021-11-08 NOTE — Assessment & Plan Note (Signed)
No upper symptoms reported.  On protonix.   

## 2021-11-08 NOTE — Assessment & Plan Note (Signed)
Continue citalopram.  Overall appears to be handling things well.  Follow.   

## 2021-11-08 NOTE — Assessment & Plan Note (Signed)
Well controlled Continue current medications:  Hctz, metoprolol, amlodipine and losartan.  Recheck metabolic panel

## 2021-11-08 NOTE — Assessment & Plan Note (Signed)
On crestor.  Low cholesterol diet and exercise.  Follow lipid panel and liver function tests.   

## 2021-11-08 NOTE — Assessment & Plan Note (Signed)
Follow cbc.  Followed by hematology.  Per hematology, no further w/up warranted.  If starts to decline, further w/up then.  Recheck today.

## 2021-11-08 NOTE — Progress Notes (Signed)
Order placed for f/u met b 

## 2021-11-08 NOTE — Assessment & Plan Note (Signed)
On femara.  Followed by oncology.

## 2021-11-08 NOTE — Assessment & Plan Note (Signed)
Low carb diet and exercise.  Follow met b and a1c.  

## 2021-11-09 ENCOUNTER — Telehealth: Payer: Self-pay

## 2021-11-09 NOTE — Addendum Note (Signed)
Addended by: Lars Masson on: 11/09/2021 02:15 PM   Modules accepted: Orders

## 2021-11-09 NOTE — Telephone Encounter (Signed)
LMTCB in regards to lab results.  

## 2021-11-20 NOTE — Progress Notes (Deleted)
Unfortunately, this is the patient's third and last no-show.  The patient failed to show up or cancel her 07/05/2021, 08/31/2021, and 11/21/2021 appointments.  A letter will be sent to the patient discharging her from our service.  I am also sending a prescription to her pharmacy to slowly taper down and stop her opioid analgesics.

## 2021-11-21 ENCOUNTER — Ambulatory Visit (HOSPITAL_BASED_OUTPATIENT_CLINIC_OR_DEPARTMENT_OTHER): Payer: Medicare Other | Admitting: Pain Medicine

## 2021-11-21 ENCOUNTER — Other Ambulatory Visit: Payer: Self-pay | Admitting: Pain Medicine

## 2021-11-21 DIAGNOSIS — M47816 Spondylosis without myelopathy or radiculopathy, lumbar region: Secondary | ICD-10-CM

## 2021-11-21 DIAGNOSIS — M545 Low back pain, unspecified: Secondary | ICD-10-CM

## 2021-11-21 DIAGNOSIS — Z79891 Long term (current) use of opiate analgesic: Secondary | ICD-10-CM

## 2021-11-21 DIAGNOSIS — G8929 Other chronic pain: Secondary | ICD-10-CM

## 2021-11-21 DIAGNOSIS — S46001S Unspecified injury of muscle(s) and tendon(s) of the rotator cuff of right shoulder, sequela: Secondary | ICD-10-CM

## 2021-11-21 DIAGNOSIS — Z79899 Other long term (current) drug therapy: Secondary | ICD-10-CM

## 2021-11-21 DIAGNOSIS — M25561 Pain in right knee: Secondary | ICD-10-CM

## 2021-11-21 DIAGNOSIS — G894 Chronic pain syndrome: Secondary | ICD-10-CM

## 2021-11-21 MED ORDER — HYDROCODONE-ACETAMINOPHEN 5-325 MG PO TABS: ORAL_TABLET | ORAL | 0 refills | Status: DC

## 2021-11-21 NOTE — Progress Notes (Addendum)
The patient was no-show to appointments on 07/05/2021, 06/30/2021, and 11/21/2021.  As per clinic policy, we would be discharging her from our practice however, the nursing staff has indicated that they will talk to her and perhaps clarify our rules and regulations.

## 2021-11-22 ENCOUNTER — Other Ambulatory Visit: Payer: Medicare Other

## 2021-11-28 ENCOUNTER — Ambulatory Visit: Payer: Self-pay | Admitting: Urology

## 2021-12-02 ENCOUNTER — Other Ambulatory Visit: Payer: Self-pay | Admitting: Internal Medicine

## 2021-12-02 DIAGNOSIS — M7918 Myalgia, other site: Secondary | ICD-10-CM

## 2021-12-14 ENCOUNTER — Other Ambulatory Visit: Payer: Self-pay | Admitting: Internal Medicine

## 2021-12-21 ENCOUNTER — Ambulatory Visit: Payer: Medicare Other | Admitting: Pain Medicine

## 2021-12-22 NOTE — Progress Notes (Signed)
PROVIDER NOTE: Information contained herein reflects review and annotations entered in association with encounter. Interpretation of such information and data should be left to medically-trained personnel. Information provided to patient can be located elsewhere in the medical record under "Patient Instructions". Document created using STT-dictation technology, any transcriptional errors that may result from process are unintentional.    Patient: Wanda Martinez  Service Category: E/M  Provider: Gaspar Cola, MD  DOB: 07/29/1935  DOS: 12/26/2021  Specialty: Interventional Pain Management  MRN: [id number]  Setting: Ambulatory outpatient  PCP: Einar Pheasant, MD  Type: Established Patient    Referring Provider: Einar Pheasant, MD  Location: Office  Delivery: Face-to-face     HPI  Wanda Martinez, a 87 y.o. year old female, is here today because of her Chronic bilateral low back pain without sciatica [M54.50, G89.29]. Wanda Martinez primary complain today is Knee Pain (right), Back Pain (bilateral), and Leg Pain (Right, lateral below the knee) Last encounter: My last encounter with her was on 11/21/2021. Pertinent problems: Wanda Martinez has Primary cancer of upper inner quadrant of right female breast ([place]); Headache; Parkinson's disease ([place]); Chronic low back pain (1ry area of Pain) (Bilateral) (R>L) w/o sciatica; Lumbar spondylosis; Chronic hip pain (Bilateral); Chronic neck pain; Cervical spondylosis; Chronic cervical radicular pain (Right); Diffuse myofascial pain syndrome; Neurogenic pain; Chronic upper back pain (Right); Myofascial pain syndrome (Right) (cervicothoracic); Lumbar facet syndrome (Bilateral) (R>L); Malignant neoplasm of right female breast ([place]); Cervical facet hypertrophy; Cervical facet syndrome ([place]); Chronic shoulder pain (Right); Chronic pain syndrome; Chronic sacroiliac joint pain (Left); Chronic sacroiliac joint pain (Right); 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,  L4-5, L5-S1) (Right); Lumbar spinal stenosis (with neurogenic claudication) (L3-4); Chronic lower extremity pain (2ry area of Pain) (Right); Chronic lumbar radicular pain (S1) (Right); Trochanteric bursitis of hip (Bilateral); Spondylosis without myelopathy or radiculopathy, lumbosacral region; Trigger point with back pain (Right); DDD (degenerative disc disease), lumbosacral; Chronic upper extremity pain (Right); Chronic thoracic back pain (Bilateral) (L>R); Trigger point of thoracic region (Bilateral) (L>R); Other specified dorsopathies, sacral and sacrococcygeal region; Sacroiliac joint dysfunction (Right); Osteoarthritis of sacroiliac joint (Right); Somatic dysfunction of sacroiliac joint (Right); Chronic musculoskeletal pain; Facial pain; Chronic hip pain (Right); Osteoarthritis of hip (Right); Left ear pain; 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 Pain Assessment: Severity of Chronic pain is reported as a 6 /10. Location: Back Lower/right knee and lateral right leg below the knee. Onset: More than a month ago. Quality: Aching, Constant. Timing: Constant (back is constant, knee stops with rest). Modifying factor(s): lying down, positioning, medication. Vitals:  height is $RemoveB'5\' 4"'nHVPMhrL$  (1.626 m) and weight is 179 lb (81.2 kg). Her temporal temperature is 97.3 F (36.3 C) (abnormal). Her blood pressure is 127/70 and her pulse is 59 (abnormal). Her respiration is 18 and oxygen saturation is 100%.    Reason for encounter: post-procedure evaluation and assessment.  The patient indicates having attained 100% relief of the pain for the duration of the local anesthetic followed by a decrease to an 80% that persisted for a month but it is slowly coming back.  Today she has been given a final warning  regarding her "no-show" issue, having missed the 07/05/2021, 06/30/2021, and 11/21/2021 appointments and not even having called to cancel those.  She indicated that she was very sick, but we pointed out that somebody else in the family could have called to cancel.  Today she asked about her medication refills, but unfortunately she also did not bring any of her empty bottles or pills to be counted.  I pointed out that on 11/21/2021, when she did not show to her follow-up appointment and medication refill, I sent a prescription to her pharmacy to slowly taper her down and stop the medicine.  When the patient learned about this, she became very nervous and upset.  The nurse Anderson Malta) spoke to her and explained that she needs to be following the clinic rules otherwise we would have to let her go.  The patient understood and accepted.  Unfortunately, this is a recurrent issue with this patient and the fact that she does not follow-up with [place] after her procedures.  This has made it extremely difficult to gather information from these procedures and to determine what needs to be done next.  Regarding her right knee, even though this Zilretta was supposed to slowly release the triamcinolone over a period 3 months, it would seem that it has not worked in that particular manner with this patient.  Today she indicated that she has fell over the knee several times, and this might have irritated things more.  Today she comes in indicating that her primary pain today is that of the lower back (Bilateral) (L>R).  She refers that it has worsened over the past week.  She refers having pain primarily in the right side of the lower  back and going down the leg with the low back pain being worse than the lower extremity pain.  In the case of the leg pain it runs down to the lateral aspect of the calf, below the knee but it does not reach the ankle or foot.  At this point, because she is still having pain in the area of the right knee, we will go ahead and order an MRI of that knee and we will get her a referral for an orthopedic evaluation.  In terms of her lower back, after having reviewed the MRI done on 12/30/2019, I have scheduled the patient to return for a bilateral L2 transforaminal epidural steroid injection #1 under fluoroscopic guidance.  The plan was shared with the patient who understood and accepted.  Regarding the patient's medications, she indicated that she would be going to her house and get the pills which she would then bring later to the pain clinic.  Because she arrived late to her appointment today, we are close to the closing of the clinic for the day and therefore the patient was instructed to return the next day with her pills to be counted.  Once she does that, we may proceed to reorder her medicines, assuming that she still wants to continue taking them, which at this point it would seem she does feel that she continues to need them.  Post-procedure evaluation    Procedure: ER-steroid (Zilretta) Intra-articular Knee Injection  No.: n/a   Series: n/a Level/approach: Lateral Laterality: Right (-RT) Imaging guidance: None required ([id number]) Analgesia: Local anesthesia Sedation: None.    Purpose: Diagnostic/Therapeutic Indications: Knee arthralgia associated to osteoarthritis of the knee   NAS-11 score:         Pre-procedure: 7 /10  Post-procedure: 7 /10    1. Chronic knee pain (Right)   2. Osteoarthritis of knee (Right)      Effectiveness:  Initial hour after procedure: 100 %. Subsequent 4-6 hours post-procedure: 100 %. Analgesia past initial 6 hours: 80 % (for about a month). Ongoing  improvement:  Analgesic: Ongoing 50% relief of the knee pain Function: Somewhat improved ROM: Somewhat improved   Pharmacotherapy Assessment  Analgesic: Hydrocodone/APAP 5/325, 1 tab PO q 8 hrs (15 mg/day of hydrocodone) MME/day: 15 mg/day.   Monitoring:  PMP: PDMP reviewed during this encounter.       Pharmacotherapy: No side-effects or adverse reactions reported. Compliance: No problems identified. Effectiveness: Clinically acceptable.  Hart Rochester, RN  12/26/2021  2:48 PM  Sign when Signing Visit Nursing Pain Medication Assessment:  Safety precautions to be maintained throughout the outpatient stay will include: orient to surroundings, keep bed in low position, maintain call bell within reach at all times, provide assistance with transfer out of bed and ambulation.  Medication Inspection Compliance: Wanda Martinez did not comply with our request to bring her pills to be counted. She was reminded that bringing the medication bottles, even when empty, is a requirement.  Medication: None brought in. Pill/Patch Count: None available to be counted. Bottle Appearance: No container available. Did not bring bottle(s) to appointment. Filled Date: N/A Last Medication intake:  Today    UDS:  Summary  Date Value Ref Range Status  06/01/2021 Note  Final    Comment:    ==================================================================== [employer] Select 13 (MW) ==================================================================== Test                             Result       Flag       Units  Drug Present and Declared for Prescription Verification   Hydrocodone                    255          EXPECTED   ng/mg creat   Dihydrocodeine                 113          EXPECTED   ng/mg creat   Norhydrocodone                 1235         EXPECTED   ng/mg creat    Sources of hydrocodone include scheduled prescription medications.    Dihydrocodeine and norhydrocodone are expected metabolites  of    hydrocodone. Dihydrocodeine is also available as a scheduled    prescription medication.  ==================================================================== Test                      Result    Flag   Units      Ref Range   Creatinine              97               mg/dL      >=20 ==================================================================== Declared Medications:  The flagging and interpretation on this report are based on the  following declared medications.  Unexpected results may arise from  inaccuracies in the declared medications.   **Note: The testing scope of this panel includes these medications:   Hydrocodone (Norco)   **Note: The testing scope of this panel does not include the  following reported  medications:   Acetaminophen (Norco)  Amlodipine (Norvasc)  Aspirin  Calcium  Citalopram (Celexa)  Cyclobenzaprine (Flexeril)  Donepezil (Aricept)  Gabapentin (Neurontin)  Glucosamine  Hydrochlorothiazide (Hydrodiuril)  Letrozole (Femara)  Losartan (Cozaar)  Lubiprostone (Amitiza)  Magnesium (Mag-Ox)  Melatonin  Meloxicam (Mobic)  Metoprolol (Toprol)  Multivitamin  Omeprazole (Prilosec)  Pantoprazole (Protonix)  Pravastatin (Pravachol) ==================================================================== For clinical consultation, please call [phone]. ====================================================================      ROS  Constitutional: Denies any fever or chills Gastrointestinal: No reported hemesis, hematochezia, vomiting, or acute GI distress Musculoskeletal: Denies any acute onset joint swelling, redness, loss of ROM, or weakness Neurological: No reported episodes of acute onset apraxia, aphasia, dysarthria, agnosia, amnesia, paralysis, loss of coordination, or loss of consciousness  Medication Review  Glucosamine Sulfate, Glucosamine-Chondroitin, HYDROcodone-acetaminophen, Omega-3, amLODipine, aspirin, calcium carbonate,  citalopram, cyclobenzaprine, donepezil, gabapentin, hydrochlorothiazide, letrozole, losartan, lubiprostone, magnesium oxide, melatonin, meloxicam, metoprolol succinate, multivitamin, nitrofurantoin (macrocrystal-monohydrate), omeprazole, pantoprazole, and rosuvastatin  History Review  Allergy: Wanda Martinez is allergic to sulfa antibiotics and vesicare [solifenacin]. Drug: Wanda Martinez  reports no history of drug use. Alcohol:  reports no history of alcohol use. Tobacco:  reports that she has never smoked. She has never used smokeless tobacco. Social: Wanda Martinez  reports that she has never smoked. She has never used smokeless tobacco. She reports that she does not drink alcohol and does not use drugs. Medical:  has a past medical history of Acute postoperative pain (10/25/2017), Anemia, Arm pain (07/26/2015), Arthritis, Arthritis, degenerative (03/26/2014), Back pain (11/01/2013), Bladder infection (06/2018), Breast cancer ([place]), Breast cancer ([place]), CHEST PAIN (04/29/2010), Chronic cystitis, Cystocele (02/22/2013), Cystocele, midline (08/19/2013), Degeneration of intervertebral disc of lumbosacral region (03/26/2014), DYSPNEA (04/29/2010), Enthesopathy of hip (03/26/2014), GERD (gastroesophageal reflux disease), Hiatal hernia, HTN (hypertension), Hypokalemia (06/2018), Hyponatremia (06/2018), LBP (low back pain) (03/26/2014), Neck pain (11/01/2013), Parkinson disease ([place]), Pneumonia (06/2018), Sinusitis (02/07/2015), Skin lesions (07/12/2014), and Urinary incontinence. Surgical: Wanda Martinez  has a past surgical history that includes Tonsillectomy and adenoidectomy (81); Vesicovaginal fistula closure w/ TAH (1983); Breast surgery (1986); Breast enhancement surgery (1987); Breast implant removal; Breast implant removal (Right, 08/29/2012); Mastectomy (08/2012); Abdominal hysterectomy; Colonoscopy with propofol (N/A, 09/13/2016); and Breast biopsy (2013). Family: family history includes Colon polyps in her father; Diabetes in her  father; Parkinson's disease in her mother; Stroke in her father and mother.  Laboratory Chemistry Profile   Renal Lab Results  Component Value Date   BUN 16 11/07/2021   CREATININE 1.05 11/07/2021   BCR 20 06/11/2020   GFR 48.95 (L) 11/07/2021   GFRAA >60 07/27/2020   GFRNONAA 57 (L) 08/03/2021    Hepatic Lab Results  Component Value Date   AST 22 11/07/2021   ALT 15 11/07/2021   ALBUMIN 4.3 11/07/2021   ALKPHOS 37 (L) 11/07/2021    Electrolytes Lab Results  Component Value Date   NA 134 (L) 11/07/2021   K 5.0 11/07/2021   CL 97 11/07/2021   CALCIUM 9.7 11/07/2021   MG 2.2 07/17/2018    Bone Lab Results  Component Value Date   25OHVITD1 42 07/17/2018   25OHVITD2 <1.0 07/17/2018   25OHVITD3 42 07/17/2018    Inflammation (CRP: Acute Phase) (ESR: Chronic Phase) Lab Results  Component Value Date   CRP 3 07/17/2018   ESRSEDRATE 27 07/17/2018         Note: Above Lab results reviewed.  Recent Imaging Review  DG PAIN CLINIC C-ARM 1-60 MIN NO REPORT Fluoro was used, but no Radiologist interpretation will be provided.  Please  refer to "NOTES" tab for provider progress note. Note: Reviewed        Physical Exam  General appearance: Well nourished, well developed, and well hydrated. In no apparent acute distress Mental status: Alert, oriented x 3 (person, place, & time)       Respiratory: No evidence of acute respiratory distress Eyes: PERLA Vitals: BP 127/70 (BP Location: Left Arm, Patient Position: Sitting)    Pulse (!) 59    Temp (!) 97.3 F (36.3 C) (Temporal)    Resp 18    Ht $R'5\' 4"'Oa$  (1.626 m)    Wt 179 lb (81.2 kg)    LMP 12/18/1981    SpO2 100%    BMI 30.73 kg/m  BMI: Estimated body mass index is 30.73 kg/m as calculated from the following:   Height as of this encounter: $RemoveBeforeD'5\' 4"'StXOGNBZXhUzyL$  (1.626 m).   Weight as of this encounter: 179 lb (81.2 kg). Ideal: Ideal body weight: 54.7 kg (120 lb 9.5 oz) Adjusted ideal body weight: 65.3 kg (143 lb 15.3 oz)  Assessment    Status Diagnosis  Controlled Controlled Controlled 1. Chronic low back pain (1ry area of Pain) (Bilateral) (R>L) w/o sciatica   2. Chronic lower extremity pain (2ry area of Pain) (Right)   3. Chronic hip pain (Bilateral)   4. Cervicalgia   5. Chronic shoulder pain (Right)   6. Osteoarthritis of knee (Right)   7. Chronic knee pain (Right)      Updated Problems: No problems updated.  Plan of Care  Problem-specific:  No problem-specific Assessment & Plan notes found for this encounter.  Wanda Martinez has a current medication list which includes the following long-term medication(s): amlodipine, calcium carbonate, citalopram, cyclobenzaprine, donepezil, gabapentin, hydrochlorothiazide, hydrocodone-acetaminophen, losartan, magnesium oxide, magnesium oxide, meloxicam, metoprolol succinate, omeprazole, pantoprazole, and rosuvastatin.  Pharmacotherapy (Medications Ordered): No orders of the defined types were placed in this encounter.  Orders:  Orders Placed This Encounter  Procedures   Lumbar Transforaminal Epidural    Standing Status:   Future    Standing Expiration Date:   03/26/2022    Scheduling Instructions:     Side: Bilateral     Level: L2     Sedation: Patient's choice.     Timeframe: ASAP    Order Specific Question:   Where will this procedure be performed?    Answer:   [facility]   MR KNEE RIGHT WO CONTRAST    Standing Status:   Future    Standing Expiration Date:   01/26/2022    Scheduling Instructions:     Imaging must be done as soon as possible. Inform patient that order will expire within 30 days and I will not renew it.    Order Specific Question:   What is the patient's sedation requirement?    Answer:   No Sedation    Order Specific Question:   Does the patient have a pacemaker or implanted devices?    Answer:   No    Order Specific Question:   Preferred imaging location?    Answer:   [facility] (table limit-350lbs)    Order  Specific Question:   Call Results- Best Contact Number?    Answer:   [phone] ([facility])    Order Specific Question:   Radiology Contrast Protocol - do NOT remove file path    Answer:   [email or web address]   Ambulatory referral to Orthopedic Surgery    Referral Priority:   Routine    Referral Type:  Surgical    Referral Reason:   Specialty Services Required    Requested Specialty:   Orthopedic Surgery    Number of Visits Requested:   1   Follow-up plan:   Return for (Clinic) procedure: (B) L2 TFESI #1, (Sed-anx).     Interventional Therapies  Risk   Complexity Considerations:   Estimated body mass index is 30.73 kg/m as calculated from the following:   Height as of this encounter: $RemoveBeforeD'5\' 4"'ckqgMgdLEnFDjR$  (1.626 m).   Weight as of this encounter: 179 lb (81.2 kg). WNL   Planned   Pending:       Under consideration:   Diagnostic right genicular NB  Diagnostic bilateral cervical facet MBB    Completed:   Therapeutic right superior/lateral trapezius muscle TPI x1 (04/19/2021)  Therapeutic right thoracic back TPI x2 (10/17/2018)  (DKFUA)  Therapeutic left thoracic back TPI x1 (10/17/2018)  (DKFUA)  Therapeutic right quadratus lumborum and erector spinae muscle TPI x1 (02/12/2018)  Therapeutic bilateral lumbar facet MBB x5 (08/09/2021) (DKFUA)  Therapeutic right lumbar facet MBB x4 (12/21/2020) (DKFUA)  Therapeutic right lumbar facet RFA x2 (06/04/2018) (100/90/75/75-100)  Therapeutic left lumbar facet RFA x1 (02/12/2018) (100/60/90/>75)  Diagnostic right SI joint Blk x3 (12/21/2020)  (DKFUA)  Diagnostic left SI joint Blk x1 (08/09/2017) (100/90/20/<25)  Therapeutic right SI joint RFA x3 (06/05/2019) (100/100/85/>50)  Therapeutic midline L2-3 LESI x1 (10/04/2017) (100/100/85/>50)  Therapeutic right L3-4 LESI x2 (11/04/2020) (100/100/20/<50)  Therapeutic right L4-5 LESI x1 (05/20/2020) (100/100/80/>75)  Therapeutic right L5-S1 LESI x2 (12/04/2019) (100/100/50/75)   Therapeutic right L5 TFESI x3 (12/04/2019) (100/100/50/75)  Therapeutic right S1 TFESI x1 (11/04/2020) (100/100/20/<50)  Diagnostic bilateral superficial trochanteric bursa injec. x1 (04/30/2018) (DKFUA)  Diagnostic right IA hip injection x1 (02/13/2019) (DKFUA)  Diagnostic/therapeutic right (steroid) knee injection x1 (04/19/2021) (100/100/50/50)  Therapeutic right (Zilretta) knee injection x1 (10/06/2021)  (DKFUA)  Therapeutic right (Monovisc) knee injection x1 (06/07/2021) (100/100/100/75)    Therapeutic   Palliative (PRN) options:   Therapeutic/palliative lumbar facet RFA  Therapeutic/palliative lumbar facet MBB     Recent Visits Date Type Provider Dept  12/26/21 Office Visit Milinda Pointer, MD [facility]  10/06/21 Procedure visit Milinda Pointer, MD [facility]  Showing recent visits within past 90 days and meeting all other requirements Future Appointments No visits were found meeting these conditions. Showing future appointments within next 90 days and meeting all other requirements  I discussed the assessment and treatment plan with the patient. The patient was provided an opportunity to ask questions and all were answered. The patient agreed with the plan and demonstrated an understanding of the instructions.  Patient advised to call back or seek an in-person evaluation if the symptoms or condition worsens.  Duration of encounter: 41 minutes.  Note by: Gaspar Cola, MD Date: 12/26/2021; Time: 7:02 AM

## 2021-12-26 ENCOUNTER — Ambulatory Visit: Payer: Medicare Other | Attending: Pain Medicine | Admitting: Pain Medicine

## 2021-12-26 ENCOUNTER — Encounter: Payer: Self-pay | Admitting: Pain Medicine

## 2021-12-26 ENCOUNTER — Other Ambulatory Visit: Payer: Self-pay

## 2021-12-26 VITALS — BP 127/70 | HR 59 | Temp 97.3°F | Resp 18 | Ht 64.0 in | Wt 179.0 lb

## 2021-12-26 DIAGNOSIS — M545 Low back pain, unspecified: Secondary | ICD-10-CM | POA: Insufficient documentation

## 2021-12-26 DIAGNOSIS — M542 Cervicalgia: Secondary | ICD-10-CM

## 2021-12-26 DIAGNOSIS — M25511 Pain in right shoulder: Secondary | ICD-10-CM | POA: Insufficient documentation

## 2021-12-26 DIAGNOSIS — M25551 Pain in right hip: Secondary | ICD-10-CM | POA: Insufficient documentation

## 2021-12-26 DIAGNOSIS — G8929 Other chronic pain: Secondary | ICD-10-CM

## 2021-12-26 DIAGNOSIS — M79604 Pain in right leg: Secondary | ICD-10-CM | POA: Diagnosis not present

## 2021-12-26 DIAGNOSIS — M25552 Pain in left hip: Secondary | ICD-10-CM | POA: Insufficient documentation

## 2021-12-26 DIAGNOSIS — M25561 Pain in right knee: Secondary | ICD-10-CM | POA: Diagnosis not present

## 2021-12-26 DIAGNOSIS — M1711 Unilateral primary osteoarthritis, right knee: Secondary | ICD-10-CM

## 2021-12-26 NOTE — Patient Instructions (Signed)
Pain Management Discharge Instructions  General Discharge Instructions :  If you need to reach your doctor call: Monday-Friday 8:00 am - 4:00 pm at [phone] or toll free [phone].  After clinic hours [phone] to have operator reach doctor.  Bring all of your medication bottles to all your appointments in the pain clinic.  To cancel or reschedule your appointment with Pain Management please remember to call 24 hours in advance to avoid a fee.  Refer to the educational materials which you have been given on: General Risks, I had my Procedure. Discharge Instructions, Post Sedation.  Post Procedure Instructions:  The drugs you were given will stay in your system until tomorrow, so for the next 24 hours you should not drive, make any legal decisions or drink any alcoholic beverages.  You may eat anything you prefer, but it is better to start with liquids then soups and crackers, and gradually work up to solid foods.  Please notify your doctor immediately if you have any unusual bleeding, trouble breathing or pain that is not related to your normal pain.  Depending on the type of procedure that was done, some parts of your body may feel week and/or numb.  This usually clears up by tonight or the next day.  Walk with the use of an assistive device or accompanied by an adult for the 24 hours.  You may use ice on the affected area for the first 24 hours.  Put ice in a Ziploc bag and cover with a towel and place against area 15 minutes on 15 minutes off.  You may switch to heat after 24 hours.Selective Nerve Root Block Patient Information  Description: Specific nerve roots exit the spinal canal and these nerves can be compressed and inflamed by a bulging disc and bone spurs.  By injecting steroids on the nerve root, we can potentially decrease the inflammation surrounding these nerves, which often leads to decreased pain.  Also, by injecting local anesthesia on the nerve root, this can  provide us helpful information to give to your referring doctor if it decreases your pain.  Selective nerve root blocks can be done along the spine from the neck to the low back depending on the location of your pain.   After numbing the skin with local anesthesia, a small needle is passed to the nerve root and the position of the needle is verified using x-ray pictures.  After the needle is in correct position, we then deposit the medication.  You may experience a pressure sensation while this is being done.  The entire block usually lasts less than 15 minutes.  Conditions that may be treated with selective nerve root blocks: Low back and leg pain Spinal stenosis Diagnostic block prior to potential surgery Neck and arm pain Post laminectomy syndrome  Preparation for the injection:  Do not eat any solid food or dairy products within 8 hours of your appointment. You may drink clear liquids up to 3 hours before an appointment.  Clear liquids include water, black coffee, juice or soda.  No milk or cream please. You may take your regular medications, including pain medications, with a sip of water before your appointment.  Diabetics should hold regular insulin (if taken separately) and take 1/2 normal NPH dose the morning of the procedure.  Carry some sugar containing items with you to your appointment. A driver must accompany you and be prepared to drive you home after your procedure. Bring all your current medications with you. An IV   may be inserted and sedation may be given at the discretion of the physician. A blood pressure cuff, EKG, and other monitors will often be applied during the procedure.  Some patients may need to have extra oxygen administered for a short period. You will be asked to provide medical information, including allergies, prior to the procedure.  We must know immediately if you are taking blood  Thinners (like Coumadin) or if you are allergic to IV iodine contrast  (dye).  Possible side-effects: All are usually temporary Bleeding from needle site Light headedness Numbness and tingling Decreased blood pressure Weakness in arms/legs Pressure sensation in back/neck Pain at injection site (several days)  Possible complications: All are extremely rare Infection Nerve injury Spinal headache (a headache wore with upright position)  Call if you experience: Fever/chills associated with headache or increased back/neck pain Headache worsened by an upright position New onset weakness or numbness of an extremity below the injection site Hives or difficulty breathing (go to the emergency room) Inflammation or drainage at the injection site(s) Severe back/neck pain greater than usual New symptoms which are concerning to you  Please note:  Although the local anesthetic injected can often make your back or neck feel good for several hours after the injection the pain will likely return.  It takes 3-5 days for steroids to work on the nerve root. You may not notice any pain relief for at least one week.  If effective, we will often do a series of 3 injections spaced 3-6 weeks apart to maximally decrease your pain.    If you have any questions, please call [phone] [facility] 

## 2021-12-26 NOTE — Progress Notes (Signed)
Nursing Pain Medication Assessment:  Safety precautions to be maintained throughout the outpatient stay will include: orient to surroundings, keep bed in low position, maintain call bell within reach at all times, provide assistance with transfer out of bed and ambulation.  Medication Inspection Compliance: Wanda Martinez did not comply with our request to bring her pills to be counted. She was reminded that bringing the medication bottles, even when empty, is a requirement.  Medication: None brought in. Pill/Patch Count: None available to be counted. Bottle Appearance: No container available. Did not bring bottle(s) to appointment. Filled Date: N/A Last Medication intake:  Today

## 2021-12-28 ENCOUNTER — Other Ambulatory Visit: Payer: Self-pay | Admitting: Pain Medicine

## 2021-12-28 DIAGNOSIS — S46001S Unspecified injury of muscle(s) and tendon(s) of the rotator cuff of right shoulder, sequela: Secondary | ICD-10-CM

## 2021-12-28 DIAGNOSIS — Z79899 Other long term (current) drug therapy: Secondary | ICD-10-CM

## 2021-12-28 DIAGNOSIS — M25511 Pain in right shoulder: Secondary | ICD-10-CM

## 2021-12-28 DIAGNOSIS — M545 Low back pain, unspecified: Secondary | ICD-10-CM

## 2021-12-28 DIAGNOSIS — G8929 Other chronic pain: Secondary | ICD-10-CM

## 2021-12-28 DIAGNOSIS — M47816 Spondylosis without myelopathy or radiculopathy, lumbar region: Secondary | ICD-10-CM

## 2021-12-28 DIAGNOSIS — G894 Chronic pain syndrome: Secondary | ICD-10-CM

## 2021-12-28 DIAGNOSIS — Z79891 Long term (current) use of opiate analgesic: Secondary | ICD-10-CM

## 2021-12-28 MED ORDER — HYDROCODONE-ACETAMINOPHEN 5-325 MG PO TABS: 1.0000 | ORAL_TABLET | Freq: Three times a day (TID) | ORAL | 0 refills | Status: DC

## 2021-12-28 NOTE — Progress Notes (Signed)
The patient came in and brought her pills to be counted.  Pills were counted by Nonnie Done.

## 2021-12-28 NOTE — Progress Notes (Signed)
Nursing Pain Medication Assessment:  Safety precautions to be maintained throughout the outpatient stay will include: orient to surroundings, keep bed in low position, maintain call bell within reach at all times, provide assistance with transfer out of bed and ambulation.  Medication Inspection Compliance: Pill count conducted under aseptic conditions, in front of the patient. Neither the pills nor the bottle was removed from the patient's sight at any time. Once count was completed pills were immediately returned to the patient in their original bottle.  Medication: Hydrocodone/APAP Pill/Patch Count:  58.5 of 90 pills remain Pill/Patch Appearance: Markings consistent with prescribed medication Bottle Appearance: Standard pharmacy container. Clearly labeled. Filled Date: 35 / 19 / 2022 Last Medication intake:  Today

## 2022-01-04 ENCOUNTER — Other Ambulatory Visit: Payer: Self-pay | Admitting: Internal Medicine

## 2022-01-04 ENCOUNTER — Ambulatory Visit
Admission: RE | Admit: 2022-01-04 | Discharge: 2022-01-04 | Disposition: A | Discharge status: Home / Self Care | Payer: Medicare Other | Source: Ambulatory Visit | Attending: Pain Medicine | Admitting: Pain Medicine

## 2022-01-04 ENCOUNTER — Other Ambulatory Visit: Payer: Self-pay

## 2022-01-04 DIAGNOSIS — E7800 Pure hypercholesterolemia, unspecified: Secondary | ICD-10-CM

## 2022-01-04 DIAGNOSIS — M1711 Unilateral primary osteoarthritis, right knee: Secondary | ICD-10-CM | POA: Insufficient documentation

## 2022-01-04 DIAGNOSIS — M7918 Myalgia, other site: Secondary | ICD-10-CM

## 2022-01-04 DIAGNOSIS — M25561 Pain in right knee: Secondary | ICD-10-CM | POA: Insufficient documentation

## 2022-01-04 DIAGNOSIS — M25461 Effusion, right knee: Secondary | ICD-10-CM | POA: Diagnosis not present

## 2022-01-04 DIAGNOSIS — G8929 Other chronic pain: Secondary | ICD-10-CM | POA: Diagnosis not present

## 2022-01-04 DIAGNOSIS — R600 Localized edema: Secondary | ICD-10-CM | POA: Diagnosis not present

## 2022-01-09 ENCOUNTER — Telehealth: Payer: Self-pay

## 2022-01-10 ENCOUNTER — Other Ambulatory Visit: Payer: Self-pay | Admitting: Internal Medicine

## 2022-01-10 ENCOUNTER — Ambulatory Visit
Admission: RE | Admit: 2022-01-10 | Discharge: 2022-01-10 | Disposition: A | Discharge status: Home / Self Care | Payer: Medicare Other | Source: Ambulatory Visit | Attending: Pain Medicine | Admitting: Pain Medicine

## 2022-01-10 ENCOUNTER — Ambulatory Visit (HOSPITAL_BASED_OUTPATIENT_CLINIC_OR_DEPARTMENT_OTHER): Payer: Medicare Other | Admitting: Pain Medicine

## 2022-01-10 ENCOUNTER — Encounter: Payer: Self-pay | Admitting: Pain Medicine

## 2022-01-10 VITALS — BP 149/85 | HR 62 | Temp 97.3°F | Resp 17 | Ht 64.0 in | Wt 179.0 lb

## 2022-01-10 DIAGNOSIS — M51369 Other intervertebral disc degeneration, lumbar region without mention of lumbar back pain or lower extremity pain: Secondary | ICD-10-CM

## 2022-01-10 DIAGNOSIS — M48062 Spinal stenosis, lumbar region with neurogenic claudication: Secondary | ICD-10-CM

## 2022-01-10 DIAGNOSIS — G8929 Other chronic pain: Secondary | ICD-10-CM | POA: Diagnosis not present

## 2022-01-10 DIAGNOSIS — M5416 Radiculopathy, lumbar region: Secondary | ICD-10-CM | POA: Insufficient documentation

## 2022-01-10 DIAGNOSIS — R937 Abnormal findings on diagnostic imaging of other parts of musculoskeletal system: Secondary | ICD-10-CM | POA: Insufficient documentation

## 2022-01-10 DIAGNOSIS — M545 Low back pain, unspecified: Secondary | ICD-10-CM | POA: Diagnosis not present

## 2022-01-10 DIAGNOSIS — M25552 Pain in left hip: Secondary | ICD-10-CM | POA: Diagnosis not present

## 2022-01-10 DIAGNOSIS — M5136 Other intervertebral disc degeneration, lumbar region: Secondary | ICD-10-CM | POA: Diagnosis not present

## 2022-01-10 DIAGNOSIS — M48061 Spinal stenosis, lumbar region without neurogenic claudication: Secondary | ICD-10-CM | POA: Diagnosis not present

## 2022-01-10 DIAGNOSIS — M25551 Pain in right hip: Secondary | ICD-10-CM | POA: Diagnosis not present

## 2022-01-10 MED ORDER — ROPIVACAINE HCL 2 MG/ML IJ SOLN
2.0000 mL | Freq: Once | INTRAMUSCULAR | Status: AC
  Administered 2022-01-10: 13:00:00 20 mL via EPIDURAL

## 2022-01-10 MED ORDER — DEXAMETHASONE SODIUM PHOSPHATE 10 MG/ML IJ SOLN
20.0000 mg | Freq: Once | INTRAMUSCULAR | Status: AC
  Administered 2022-01-10: 13:00:00 10 mg
  Filled 2022-01-10: qty 2

## 2022-01-10 MED ORDER — LACTATED RINGERS IV SOLN: 1000.0000 mL | Freq: Once | INTRAVENOUS | Status: DC

## 2022-01-10 MED ORDER — IOHEXOL 180 MG/ML  SOLN
10.0000 mL | Freq: Once | INTRAMUSCULAR | Status: AC
  Administered 2022-01-10: 13:00:00 5 mL via INTRATHECAL
  Filled 2022-01-10: qty 20

## 2022-01-10 MED ORDER — ROPIVACAINE HCL 2 MG/ML IJ SOLN
INTRAMUSCULAR | Status: AC
  Filled 2022-01-10: qty 20

## 2022-01-10 MED ORDER — LIDOCAINE HCL 2 % IJ SOLN
20.0000 mL | Freq: Once | INTRAMUSCULAR | Status: AC
  Administered 2022-01-10: 13:00:00 400 mg
  Filled 2022-01-10: qty 20

## 2022-01-10 MED ORDER — PENTAFLUOROPROP-TETRAFLUOROETH EX AERO
INHALATION_SPRAY | Freq: Once | CUTANEOUS | Status: DC
  Filled 2022-01-10: qty 116

## 2022-01-10 MED ORDER — MIDAZOLAM HCL 5 MG/5ML IJ SOLN: 0.5000 mg | Freq: Once | INTRAMUSCULAR | Status: DC

## 2022-01-10 MED ORDER — SODIUM CHLORIDE 0.9% FLUSH
2.0000 mL | Freq: Once | INTRAVENOUS | Status: AC
  Administered 2022-01-10: 13:00:00 10 mL

## 2022-01-10 NOTE — Patient Instructions (Signed)
____________________________________________________________________________________________  Virtual Visits   What is a "Virtual Visit"? It is a healthcare communication encounter (medical visit) that takes place on real time (NOT TEXT or E-MAIL) over the telephone or computer device (desktop, laptop, tablet, smart phone, etc.). It allows for more location flexibility between the patient and the healthcare provider.  Who decides when these types of visits will be used? The physician.  Who is eligible for these types of visits? Only those patients that can be reliably reached over the telephone.  What do you mean by reliably? We do not have time to call everyone multiple times, therefore those that tend to screen calls and then call back later are not suitable candidates for this system. We understand how people are reluctant to pickup on "unknown" calls, therefore, we suggest adding our telephone numbers to your list of "CONTACT(s)". This way, you should be able to readily identify our calls when you receive one. All of our numbers are available below.   Who is not eligible? This option is not available for medication management encounters, specially for controlled substances. Patients on pain medications that fall under the category of controlled substances have to come in for "Face-to-Face" encounters. This is required for mandatory monitoring of these substances. You may be asked to provide a sample for an unannounced urine drug screening test (UDS), and we will need to count your pain pills. Not bringing your pills to be counted may result in no refill. Obviously, neither one of these can be done over the phone.  When will this type of visits be used? You can request a virtual visit whenever you are physically unable to attend a regular appointment. The decision will be made by the physician (or healthcare provider) on a case by case basis.   At what time will I be called? This is an  excellent question. The providers will try to call you whenever they have time available. Do not expect to be called at any specific time. The secretaries will assign you a time for your virtual visit appointment, but this is done simply to keep a list of those patients that need to be called, but not for the purpose of keeping a time schedule. Be advised that the call may come in anytime during the day, between the hours of 8:00 AM and 8::00 PM, depending on provider availability. We do understand that the system is not perfect. If you are unable to be available that day on a moments notice, then request an "in-person" appointment rather than a "virtual visit".  Can I request my medication visits to be "Virtual"? Yes you may request it, but the decision is entirely up to the healthcare provider. Control substances require specific monitoring that requires Face-to-Face encounters. The number of encounters  and the extent of the monitoring is determined on a case by case basis.  Add a new contact to your smart phone and label it "PAIN CLINIC" Under this contact add the following numbers: Main: [phone] (Official Contact Number) Nurses: [phone] (These are outgoing only calling systems. Do not call this number.) Dr. Davionne Dowty: [phone] or [phone] (Outgoing calls only. Do not call this number.)  ____________________________________________________________________________________________   ____________________________________________________________________________________________  Post-Procedure Discharge Instructions  Instructions: Apply ice:  Purpose: This will minimize any swelling and discomfort after procedure.  When: Day of procedure, as soon as you get home. How: Fill a plastic sandwich bag with crushed ice. Cover it with a small towel and apply to   injection site. How long: (15 min on, 15 min off) Apply for 15 minutes then remove x 15 minutes.  Repeat sequence on day  of procedure, until you go to bed. Apply heat:  Purpose: To treat any soreness and discomfort from the procedure. When: Starting the next day after the procedure. How: Apply heat to procedure site starting the day following the procedure. How long: May continue to repeat daily, until discomfort goes away. Food intake: Start with clear liquids (like water) and advance to regular food, as tolerated.  Physical activities: Keep activities to a minimum for the first 8 hours after the procedure. After that, then as tolerated. Driving: If you have received any sedation, be responsible and do not drive. You are not allowed to drive for 24 hours after having sedation. Blood thinner: (Applies only to those taking blood thinners) You may restart your blood thinner 6 hours after your procedure. Insulin: (Applies only to Diabetic patients taking insulin) As soon as you can eat, you may resume your normal dosing schedule. Infection prevention: Keep procedure site clean and dry. Shower daily and clean area with soap and water. Post-procedure Pain Diary: Extremely important that this be done correctly and accurately. Recorded information will be used to determine the next step in treatment. For the purpose of accuracy, follow these rules: Evaluate only the area treated. Do not report or include pain from an untreated area. For the purpose of this evaluation, ignore all other areas of pain, except for the treated area. After your procedure, avoid taking a long nap and attempting to complete the pain diary after you wake up. Instead, set your alarm clock to go off every hour, on the hour, for the initial 8 hours after the procedure. Document the duration of the numbing medicine, and the relief you are getting from it. Do not go to sleep and attempt to complete it later. It will not be accurate. If you received sedation, it is likely that you were given a medication that may cause amnesia. Because of this, completing  the diary at a later time may cause the information to be inaccurate. This information is needed to plan your care. Follow-up appointment: Keep your post-procedure follow-up evaluation appointment after the procedure (usually 2 weeks for most procedures, 6 weeks for radiofrequencies). DO NOT FORGET to bring you pain diary with you.   Expect: (What should I expect to see with my procedure?) From numbing medicine (AKA: Local Anesthetics): Numbness or decrease in pain. You may also experience some weakness, which if present, could last for the duration of the local anesthetic. Onset: Full effect within 15 minutes of injected. Duration: It will depend on the type of local anesthetic used. On the average, 1 to 8 hours.  From steroids (Applies only if steroids were used): Decrease in swelling or inflammation. Once inflammation is improved, relief of the pain will follow. Onset of benefits: Depends on the amount of swelling present. The more swelling, the longer it will take for the benefits to be seen. In some cases, up to 10 days. Duration: Steroids will stay in the system x 2 weeks. Duration of benefits will depend on multiple posibilities including persistent irritating factors. Side-effects: If present, they may typically last 2 weeks (the duration of the steroids). Frequent: Cramps (if they occur, drink Gatorade and take over-the-counter Magnesium 450-500 mg once to twice a day); water retention with temporary weight gain; increases in blood sugar; decreased immune system response; increased appetite. Occasional: Facial flushing (red,   warm cheeks); mood swings; menstrual changes. Uncommon: Long-term decrease or suppression of natural hormones; bone thinning. (These are more common with higher doses or more frequent use. This is why we prefer that our patients avoid having any injection therapies in other practices.)  Very Rare: Severe mood changes; psychosis; aseptic necrosis. From procedure: Some  discomfort is to be expected once the numbing medicine wears off. This should be minimal if ice and heat are applied as instructed.  Call if: (When should I call?) You experience numbness and weakness that gets worse with time, as opposed to wearing off. New onset bowel or bladder incontinence. (Applies only to procedures done in the spine)  Emergency Numbers: Durning business hours (Monday - Thursday, 8:00 AM - 4:00 PM) (Friday, 9:00 AM - 12:00 Noon): [phone] After hours: [phone] NOTE: If you are having a problem and are unable connect with, or to talk to a provider, then go to your nearest urgent care or emergency department. If the problem is serious and urgent, please call 911. ____________________________________________________________________________________________   

## 2022-01-10 NOTE — Progress Notes (Signed)
PROVIDER NOTE: Interpretation of information contained herein should be left to medically-trained personnel. Specific patient instructions are provided elsewhere under "Patient Instructions" section of medical record. This document was created in part using STT-dictation technology, any transcriptional errors that may result from this process are unintentional.  Patient: Wanda Martinez Type: Established DOB: [birth year] MRN: [id number] PCP: Einar Pheasant, MD  Service: Procedure DOS: 01/10/2022 Setting: Ambulatory Location: Ambulatory outpatient facility Delivery: Face-to-face Provider: Gaspar Cola, MD Specialty: Interventional Pain Management Specialty designation: 09 Location: Outpatient facility Ref. Prov.: Milinda Pointer, MD    Primary Reason for Visit: Interventional Pain Management Treatment. CC: Back Pain (lower)   Procedure:           Type: Trans-Foraminal Epidural Steroid Injection (Lumbar) #1  Laterality: Bilateral  Level: L2   Imaging: Fluoroscopic guidance Anesthesia: Local anesthesia (1-2% Lidocaine) Anxiolysis: IV Versed 2.0 mg Sedation: None. DOS: 01/10/2022  Performed by: Gaspar Cola, MD  Purpose: Diagnostic/Therapeutic Indications: Lumbar radicular pain severe enough to impact quality of life or function. 1. DDD (degenerative disc disease), lumbar   2. Lumbar radicular pain (Bilateral)   3. 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 Lumbar spinal stenosis (with neurogenic claudication) (L3-4)   5. Chronic low back pain (1ry area of Pain) (Bilateral) (R>L) w/o sciatica   6. Chronic hip pain (Bilateral)   7. Abnormal MRI, lumbar spine (12/30/2019)    NAS-11 Pain score:   Pre-procedure: 9 /10   Post-procedure: 0-No pain/10      Position / Prep / Materials:  Position: Prone  Prep solution: DuraPrep (Iodine Povacrylex [0.7% available iodine] and Isopropyl Alcohol, 74% w/w) Prep Area: Entire Posterior  Lumbosacral Area.  From the lower tip of the scapula down to the tailbone and from flank to flank. Materials:  Tray: Block Needle(s):  Type: Spinal  Gauge (G): 22  Length: 5-in  Qty: 2  Pre-op H&P Assessment:  Wanda Martinez is a 87 y.o. (year old), female patient, seen today for interventional treatment. She  has a past surgical history that includes Tonsillectomy and adenoidectomy (81); Vesicovaginal fistula closure w/ TAH (1983); Breast surgery (1986); Breast enhancement surgery (1987); Breast implant removal; Breast implant removal (Right, 08/29/2012); Mastectomy (08/2012); Abdominal hysterectomy; Colonoscopy with propofol (N/A, 09/13/2016); and Breast biopsy (2013). Wanda Martinez has a current medication list which includes the following prescription(s): amlodipine, aspirin, calcium carbonate, citalopram, cyclobenzaprine, donepezil, gabapentin, glucosamine sulfate, glucosamine-chondroitin, hydrochlorothiazide, hydrocodone-acetaminophen, [START ON 01/27/2022] hydrocodone-acetaminophen, [START ON 02/26/2022] hydrocodone-acetaminophen, letrozole, losartan, lubiprostone, magnesium oxide, magnesium oxide, melatonin, meloxicam, metoprolol succinate, multivitamin, nitrofurantoin (macrocrystal-monohydrate), omega-3, omeprazole, pantoprazole, and rosuvastatin, and the following Facility-Administered Medications: lactated ringers, midazolam, and pentafluoroprop-tetrafluoroeth. Her primarily concern today is the Back Pain (lower)  Initial Vital Signs:  Pulse/HCG Rate: 62ECG Heart Rate: 65 Temp: (!) 97.3 F (36.3 C) Resp: 19 BP: 136/68 SpO2: 100 %  BMI: Estimated body mass index is 30.73 kg/m as calculated from the following:   Height as of this encounter: 5\' 4"  (1.626 m).   Weight as of this encounter: 179 lb (81.2 kg).  Risk Assessment: Allergies: Reviewed. She is allergic to sulfa antibiotics and vesicare [solifenacin].  Allergy Precautions: None required Coagulopathies: Reviewed. None identified.   Blood-thinner therapy: None at this time Active Infection(s): Reviewed. None identified. Wanda Martinez is afebrile  Site Confirmation: Wanda Martinez was asked to confirm the procedure and laterality before marking the site Procedure checklist: Completed Consent: Before the procedure and under the influence of no sedative(s), amnesic(s), or anxiolytics, the  patient was informed of the treatment options, risks and possible complications. To fulfill our ethical and legal obligations, as recommended by the American Medical Association's Code of Ethics, I have informed the patient of my clinical impression; the nature and purpose of the treatment or procedure; the risks, benefits, and possible complications of the intervention; the alternatives, including doing nothing; the risk(s) and benefit(s) of the alternative treatment(s) or procedure(s); and the risk(s) and benefit(s) of doing nothing. The patient was provided information about the general risks and possible complications associated with the procedure. These may include, but are not limited to: failure to achieve desired goals, infection, bleeding, organ or nerve damage, allergic reactions, paralysis, and death. In addition, the patient was informed of those risks and complications associated to Spine-related procedures, such as failure to decrease pain; infection (i.e.: Meningitis, epidural or intraspinal abscess); bleeding (i.e.: epidural hematoma, subarachnoid hemorrhage, or any other type of intraspinal or peri-dural bleeding); organ or nerve damage (i.e.: Any type of peripheral nerve, nerve root, or spinal cord injury) with subsequent damage to sensory, motor, and/or autonomic systems, resulting in permanent pain, numbness, and/or weakness of one or several areas of the body; allergic reactions; (i.e.: anaphylactic reaction); and/or death. Furthermore, the patient was informed of those risks and complications associated with the medications. These  include, but are not limited to: allergic reactions (i.e.: anaphylactic or anaphylactoid reaction(s)); adrenal axis suppression; blood sugar elevation that in diabetics may result in ketoacidosis or comma; water retention that in patients with history of congestive heart failure may result in shortness of breath, pulmonary edema, and decompensation with resultant heart failure; weight gain; swelling or edema; medication-induced neural toxicity; particulate matter embolism and blood vessel occlusion with resultant organ, and/or nervous system infarction; and/or aseptic necrosis of one or more joints. Finally, the patient was informed that Medicine is not an exact science; therefore, there is also the possibility of unforeseen or unpredictable risks and/or possible complications that may result in a catastrophic outcome. The patient indicated having understood very clearly. We have given the patient no guarantees and we have made no promises. Enough time was given to the patient to ask questions, all of which were answered to the patient's satisfaction. Wanda Martinez has indicated that she wanted to continue with the procedure. Attestation: I, the ordering provider, attest that I have discussed with the patient the benefits, risks, side-effects, alternatives, likelihood of achieving goals, and potential problems during recovery for the procedure that I have provided informed consent. Date   Time: 01/10/2022 12:29 PM  Pre-Procedure Preparation:  Monitoring: As per clinic protocol. Respiration, ETCO2, SpO2, BP, heart rate and rhythm monitor placed and checked for adequate function Safety Precautions: Patient was assessed for positional comfort and pressure points before starting the procedure. Time-out: I initiated and conducted the "Time-out" before starting the procedure, as per protocol. The patient was asked to participate by confirming the accuracy of the "Time Out" information. Verification of the correct  person, site, and procedure were performed and confirmed by me, the nursing staff, and the patient. "Time-out" conducted as per Joint Commission's Universal Protocol (UP.01.01.01). Time: 1302  Description/Narrative of Procedure:          Target: The 6 o'clock position under the pedicle, on the affected side. Region: Posterolateral Lumbosacral Approach: Posterior Percutaneous Paravertebral approach.  Rationale (medical necessity): procedure needed and proper for the diagnosis and/or treatment of the patient's medical symptoms and needs. Procedural Technique Safety Precautions: Aspiration looking for blood return was conducted prior to  all injections. At no point did we inject any substances, as a needle was being advanced. No attempts were made at seeking any paresthesias. Safe injection practices and needle disposal techniques used. Medications properly checked for expiration dates. SDV (single dose vial) medications used. Description of the Procedure: Protocol guidelines were followed. The patient was placed in position over the procedure table. The target area was identified and the area prepped in the usual manner. Skin & deeper tissues infiltrated with local anesthetic. Appropriate amount of time allowed to pass for local anesthetics to take effect. The procedure needles were then advanced to the target area. Proper needle placement secured. Negative aspiration confirmed. Solution injected in intermittent fashion, asking for systemic symptoms every 0.5cc of injectate. The needles were then removed and the area cleansed, making sure to leave some of the prepping solution back to take advantage of its long term bactericidal properties.  Vitals:   01/10/22 1228 01/10/22 1305 01/10/22 1310 01/10/22 1313  BP: 136/68 (!) 159/86 (!) 163/99 (!) 149/85  Pulse: 62     Resp: 19 17 16 17   Temp: (!) 97.3 F (36.3 C)     SpO2: 100% 100% 100% 100%  Weight: 179 lb (81.2 kg)     Height: 5\' 4"  (1.626 m)        Start Time: 1302 hrs. End Time: 1313 hrs.  Imaging Guidance (Spinal):          Type of Imaging Technique: Fluoroscopy Guidance (Spinal) Indication(s): Assistance in needle guidance and placement for procedures requiring needle placement in or near specific anatomical locations not easily accessible without such assistance. Exposure Time: Please see nurses notes. Contrast: Before injecting any contrast, we confirmed that the patient did not have an allergy to iodine, shellfish, or radiological contrast. Once satisfactory needle placement was completed at the desired level, radiological contrast was injected. Contrast injected under live fluoroscopy. No contrast complications. See chart for type and volume of contrast used. Fluoroscopic Guidance: I was personally present during the use of fluoroscopy. "Tunnel Vision Technique" used to obtain the best possible view of the target area. Parallax error corrected before commencing the procedure. "Direction-depth-direction" technique used to introduce the needle under continuous pulsed fluoroscopy. Once target was reached, antero-posterior, oblique, and lateral fluoroscopic projection used confirm needle placement in all planes. Images permanently stored in EMR. Interpretation: I personally interpreted the imaging intraoperatively. Adequate needle placement confirmed in multiple planes. Appropriate spread of contrast into desired area was observed. No evidence of afferent or efferent intravascular uptake. No intrathecal or subarachnoid spread observed. Permanent images saved into the patient's record.  Antibiotic Prophylaxis:   Anti-infectives (From admission, onward)    None      Indication(s): None identified  Post-operative Assessment:  Post-procedure Vital Signs:  Pulse/HCG Rate: 6267 Temp: (!) 97.3 F (36.3 C) Resp: 17 BP: (!) 149/85 SpO2: 100 %  EBL: None  Complications: No immediate post-treatment complications observed by team,  or reported by patient.  Note: The patient tolerated the entire procedure well. A repeat set of vitals were taken after the procedure and the patient was kept under observation following institutional policy, for this type of procedure. Post-procedural neurological assessment was performed, showing return to baseline, prior to discharge. The patient was provided with post-procedure discharge instructions, including a section on how to identify potential problems. Should any problems arise concerning this procedure, the patient was given instructions to immediately contact us, at any time, without hesitation. In any case, we plan to contact the patient  by telephone for a follow-up status report regarding this interventional procedure.  Comments:  No additional relevant information.  Plan of Care  Orders:  Orders Placed This Encounter  Procedures   Lumbar Transforaminal Epidural    Scheduling Instructions:     Side: Bilateral     Level: L2     Sedation: Patient's choice.     Timeframe: Today    Order Specific Question:   Where will this procedure be performed?    Answer:   [facility]   [facility] C-ARM 1-60 MIN NO REPORT    Intraoperative interpretation by procedural physician at [place].    Standing Status:   Standing    Number of Occurrences:   1    Order Specific Question:   Reason for exam:    Answer:   Assistance in needle guidance and placement for procedures requiring needle placement in or near specific anatomical locations not easily accessible without such assistance.   Informed Consent Details: Physician/Practitioner Attestation; Transcribe to consent form and obtain patient signature    Provider Attestation: I, [place] Dossie Arbour, MD, (Pain Management Specialist), the physician/practitioner, attest that I have discussed with the patient the benefits, risks, side effects, alternatives, likelihood of achieving goals and potential problems during  recovery for the procedure that I have provided informed consent.    Scheduling Instructions:     Note: Always confirm laterality of pain with Wanda Martinez, before procedure.     Transcribe to consent form and obtain patient signature.    Order Specific Question:   Physician/Practitioner attestation of informed consent for procedure/surgical case    Answer:   I, the physician/practitioner, attest that I have discussed with the patient the benefits, risks, side effects, alternatives, likelihood of achieving goals and potential problems during recovery for the procedure that I have provided informed consent.    Order Specific Question:   Procedure    Answer:   Diagnostic lumbar transforaminal epidural steroid injection under fluoroscopic guidance. (See notes for level and laterality.)    Order Specific Question:   Physician/Practitioner performing the procedure    Answer:   Charonda Hefter A. Dossie Arbour, MD    Order Specific Question:   Indication/Reason    Answer:   Lumbar radiculopathy/radiculitis associated with lumbar stenosis   Provide equipment / supplies at bedside    "Block Tray" (Disposable   single use) Needle type: SpinalSpinal Amount/quantity: 2 Size: Medium (5-inch) Gauge: 22G    Standing Status:   Standing    Number of Occurrences:   1    Order Specific Question:   Specify    Answer:   Block Tray   Chronic Opioid Analgesic:  Hydrocodone/APAP 5/325, 1 tab PO q 8 hrs (15 mg/day of hydrocodone) MME/day: 15 mg/day.   Medications ordered for procedure: Meds ordered this encounter  Medications   iohexol (OMNIPAQUE) 180 MG/ML injection 10 mL    Must be Myelogram-compatible. If not available, you may substitute with a water-soluble, non-ionic, hypoallergenic, myelogram-compatible radiological contrast medium.   lidocaine (XYLOCAINE) 2 % (with pres) injection 400 mg   pentafluoroprop-tetrafluoroeth (GEBAUERS) aerosol   lactated ringers infusion 1,000 mL   midazolam (VERSED) 5 MG/5ML  injection 0.5-2 mg    Make sure Flumazenil is available in the pyxis when using this medication. If oversedation occurs, administer 0.2 mg IV over 15 sec. If after 45 sec no response, administer 0.2 mg again over 1 min; may repeat at 1 min intervals; not to exceed 4 doses (  1 mg)   sodium chloride flush (NS) 0.9 % injection 2 mL   ropivacaine (PF) 2 mg/mL (0.2%) (NAROPIN) injection 2 mL   dexamethasone (DECADRON) injection 20 mg   Medications administered: We administered iohexol, lidocaine, sodium chloride flush, ropivacaine (PF) 2 mg/mL (0.2%), and dexamethasone.  See the medical record for exact dosing, route, and time of administration.  Follow-up plan:   Return in about 2 weeks (around 01/24/2022) for Eval-day (M,W), (F2F), (PPE).        Interventional Therapies  Risk   Complexity Considerations:   Estimated body mass index is 30.73 kg/m as calculated from the following:   Height as of this encounter: 5\' 4"  (1.626 m).   Weight as of this encounter: 179 lb (81.2 kg). WNL   Planned   Pending:       Under consideration:   Diagnostic right genicular NB  Diagnostic bilateral cervical facet MBB    Completed:   Therapeutic right superior/lateral trapezius muscle TPI x1 (04/19/2021)  Therapeutic right thoracic back TPI x2 (10/17/2018)  (DKFUA)  Therapeutic left thoracic back TPI x1 (10/17/2018)  (DKFUA)  Therapeutic right quadratus lumborum and erector spinae muscle TPI x1 (02/12/2018)  Therapeutic bilateral lumbar facet MBB x5 (08/09/2021) (DKFUA)  Therapeutic right lumbar facet MBB x4 (12/21/2020) (DKFUA)  Therapeutic right lumbar facet RFA x2 (06/04/2018) (100/90/75/75-100)  Therapeutic left lumbar facet RFA x1 (02/12/2018) (100/60/90/>75)  Diagnostic right SI joint Blk x3 (12/21/2020)  (DKFUA)  Diagnostic left SI joint Blk x1 (08/09/2017) (100/90/20/<25)  Therapeutic right SI joint RFA x3 (06/05/2019) (100/100/85/>50)  Therapeutic midline L2-3 LESI x1 (10/04/2017) (100/100/85/>50)   Therapeutic right L3-4 LESI x2 (11/04/2020) (100/100/20/<50)  Therapeutic right L4-5 LESI x1 (05/20/2020) (100/100/80/>75)  Therapeutic right L5-S1 LESI x2 (12/04/2019) (100/100/50/75)  Therapeutic right L5 TFESI x3 (12/04/2019) (100/100/50/75)  Therapeutic right S1 TFESI x1 (11/04/2020) (100/100/20/<50)  Diagnostic bilateral superficial trochanteric bursa injec. x1 (04/30/2018) (DKFUA)  Diagnostic right IA hip injection x1 (02/13/2019) (DKFUA)  Diagnostic/therapeutic right (steroid) knee injection x1 (04/19/2021) (100/100/50/50)  Therapeutic right (Zilretta) knee injection x1 (10/06/2021)  (DKFUA)  Therapeutic right (Monovisc) knee injection x1 (06/07/2021) (100/100/100/75)    Therapeutic   Palliative (PRN) options:   Therapeutic/palliative lumbar facet RFA  Therapeutic/palliative lumbar facet MBB     Recent Visits Date Type Provider Dept  12/26/21 Office Visit Milinda Pointer, MD [facility]  Showing recent visits within past 90 days and meeting all other requirements Today's Visits Date Type Provider Dept  01/10/22 Procedure visit Milinda Pointer, MD [facility]  Showing today's visits and meeting all other requirements Future Appointments Date Type Provider Dept  01/26/22 Appointment Milinda Pointer, MD [facility]  03/20/22 Appointment Milinda Pointer, MD [facility]  Showing future appointments within next 90 days and meeting all other requirements  Disposition: Discharge home  Discharge (Date   Time): 01/10/2022; 1320 hrs.   Primary Care Physician: Einar Pheasant, MD Location: [facility] Note by: Gaspar Cola, MD Date: 01/10/2022; Time: 1:56 PM  Disclaimer:  Medicine is not an Chief Strategy Officer. The only guarantee in medicine is that nothing is guaranteed. It is important to note that the decision to proceed with this intervention was based on the information collected from the patient. The  Data and conclusions were drawn from the patient's questionnaire, the interview, and the physical examination. Because the information was provided in large part by the patient, it cannot be guaranteed that it has not been purposely or unconsciously manipulated. Every effort  has been made to obtain as much relevant data as possible for this evaluation. It is important to note that the conclusions that lead to this procedure are derived in large part from the available data. Always take into account that the treatment will also be dependent on availability of resources and existing treatment guidelines, considered by other Pain Management Practitioners as being common knowledge and practice, at the time of the intervention. For Medico-Legal purposes, it is also important to point out that variation in procedural techniques and pharmacological choices are the acceptable norm. The indications, contraindications, technique, and results of the above procedure should only be interpreted and judged by a Board-Certified Interventional Pain Specialist with extensive familiarity and expertise in the same exact procedure and technique.

## 2022-01-10 NOTE — Progress Notes (Signed)
Safety precautions to be maintained throughout the outpatient stay will include: orient to surroundings, keep bed in low position, maintain call bell within reach at all times, provide assistance with transfer out of bed and ambulation.  

## 2022-01-11 ENCOUNTER — Telehealth: Payer: Self-pay

## 2022-01-11 ENCOUNTER — Ambulatory Visit: Payer: Medicare Other | Admitting: Orthopedic Surgery

## 2022-01-11 NOTE — Telephone Encounter (Signed)
Post procedure phone call.  Patient states she is doing good today.

## 2022-01-16 ENCOUNTER — Ambulatory Visit: Payer: Medicare Other | Admitting: Urology

## 2022-01-16 ENCOUNTER — Other Ambulatory Visit: Payer: Self-pay

## 2022-01-16 ENCOUNTER — Encounter: Payer: Self-pay | Admitting: Urology

## 2022-01-16 VITALS — BP 131/73 | HR 68 | Ht 64.0 in | Wt 179.0 lb

## 2022-01-16 DIAGNOSIS — N390 Urinary tract infection, site not specified: Secondary | ICD-10-CM | POA: Diagnosis not present

## 2022-01-16 DIAGNOSIS — N3020 Other chronic cystitis without hematuria: Secondary | ICD-10-CM

## 2022-01-16 NOTE — Progress Notes (Signed)
01/16/2022 12:57 PM   Wanda Martinez 02-06-1935 [id number]  Referring provider: Einar Pheasant, [address] [place],  [place] [address]  Chief Complaint  Patient presents with   Follow-up    HPI: I was consulted to assess the patient's recurrent bladder infections.  She gets frequency and burning that usually respond to antibiotics.  She gets 6 bladder infections a year.  She has had some positive negative cultures on review of medical records  When she is not infected she voids every 1-2 hours and gets up 3 times a night.  She has urge incontinence especially if she holds it too long.  She can might have mild stress incontinence.  She has moderate bedwetting  She has had a hysterectomy     Pathophysiology of chronic cystitis discussed.  Role of prophylaxis discussed.  Reassess for pelvic semination cystoscopy in 6 weeks with cystoscopy.  Call if ultrasound abnormal.  Call if urine culture abnormal.  Role of probiotics discussed.  See if mixed incontinence down regulate   Macrobid 100 mg 3x11 sent.   Today Frequency stable.  No hydronephrosis on ultrasound or stones.  Last culture negative.  No infection.  Clinically doing well   Normal cysto  Clinically doing well on urinary prophylaxis.  Reassess in 4 months.  Today Frequency stable.  She may have had 1 breakthrough when she had steroids for her back.  Otherwise does well.  Infection free today.    PMH: Past Medical History:  Diagnosis Date   Acute postoperative pain 10/25/2017   Anemia    Arm pain 07/26/2015   Arthritis    Arthritis, degenerative 03/26/2014   Back pain 11/01/2013   Bladder infection 06/2018   Breast cancer ([place])    Masectomy - left - 1986    Breast cancer [facility])    Mastectomy-right -2014   CHEST PAIN 04/29/2010   Qualifier: Diagnosis of  By: Wynetta Emery RN, Erika     Chronic cystitis    Cystocele 02/22/2013   Cystocele, midline 08/19/2013   Degeneration of intervertebral  disc of lumbosacral region 03/26/2014   DYSPNEA 04/29/2010   Qualifier: Diagnosis of  By: Wynetta Emery RN, Erika     Enthesopathy of hip 03/26/2014   GERD (gastroesophageal reflux disease)    Hiatal hernia    HTN (hypertension)    Hypokalemia 06/2018   Hyponatremia 06/2018   LBP (low back pain) 03/26/2014   Neck pain 11/01/2013   Parkinson disease ([place])    Pneumonia 06/2018   Sinusitis 02/07/2015   Skin lesions 07/12/2014   Urinary incontinence    mixed     Surgical History: Past Surgical History:  Procedure Laterality Date   ABDOMINAL HYSTERECTOMY     BREAST BIOPSY  2013   BREAST ENHANCEMENT SURGERY  1987   BREAST IMPLANT REMOVAL     BREAST IMPLANT REMOVAL Right 08/29/2012   BREAST SURGERY  1986   s/p left mastectomy   COLONOSCOPY WITH PROPOFOL N/A 09/13/2016   Procedure: COLONOSCOPY WITH PROPOFOL;  Surgeon: Manya Silvas, MD;  Location: [facility];  Service: Endoscopy;  Laterality: N/A;   MASTECTOMY  08/2012   right   TONSILLECTOMY AND ADENOIDECTOMY  79   VESICOVAGINAL FISTULA CLOSURE W/ TAH  1983    Home Medications:  Allergies as of 01/16/2022       Reactions   Sulfa Antibiotics Nausea Only   Vesicare [solifenacin] Other (See Comments)   Constipation Constipation        Medication List  Accurate as of January 16, 2022 12:57 PM. If you have any questions, ask your nurse or doctor.          amLODipine 2.5 MG tablet Commonly known as: NORVASC TAKE 1 TABLET BY MOUTH ONCE DAILY.   aspirin 81 MG EC tablet Take 81 mg by mouth daily as needed.   calcium carbonate 1500 (600 Ca) MG Tabs tablet Commonly known as: OSCAL Take 1 tablet by mouth daily.   citalopram 20 MG tablet Commonly known as: CELEXA TAKE 1 TABLET BY MOUTH ONCE DAILY.   cyclobenzaprine 10 MG tablet Commonly known as: FLEXERIL TAKE ONE TABLET BY MOUTH AT BEDTIME.   donepezil 5 MG tablet Commonly known as: ARICEPT   gabapentin 300 MG capsule Commonly known as: NEURONTIN TAKE (2)  CAPSULES BY MOUTH TWICE DAILY   GLUCOSAMINE SULFATE PO Take by mouth daily.   Glucosamine-Chondroitin 500-400 MG Caps Take 2 capsules by mouth daily.   hydrochlorothiazide 25 MG tablet Commonly known as: HYDRODIURIL TAKE 1 TABLET BY MOUTH ONCE DAILY. (PATIENT TAKES 1/2 ONCE DAILY AND EXTRA 1/2 IF NEEDED)   HYDROcodone-acetaminophen 5-325 MG tablet Commonly known as: NORCO/VICODIN Take 1 tablet by mouth 3 (three) times daily. Must last 30 days.   HYDROcodone-acetaminophen 5-325 MG tablet Commonly known as: NORCO/VICODIN Take 1 tablet by mouth 3 (three) times daily. Must last 30 days. Start taking on: January 27, 2022   HYDROcodone-acetaminophen 5-325 MG tablet Commonly known as: NORCO/VICODIN Take 1 tablet by mouth 3 (three) times daily. Must last 30 days. Start taking on: February 26, 2022   letrozole 2.5 MG tablet Commonly known as: FEMARA Take 2.5 mg by mouth daily.   losartan 50 MG tablet Commonly known as: COZAAR TAKE (2) TABLETS BY MOUTH ONCE DAILY.   lubiprostone 24 MCG capsule Commonly known as: AMITIZA TAKE 1 CAPSULE BY MOUTH ONCE DAILY WITH BREAKFAST.   magnesium oxide 400 (241.3 Mg) MG tablet Commonly known as: MAG-OX TAKE 1 TABLET BY MOUTH ONCE DAILY.   magnesium oxide 400 (240 Mg) MG tablet Commonly known as: MAG-OX TAKE 1 TABLET BY MOUTH ONCE DAILY.   melatonin 1 MG Tabs tablet Take 3 mg by mouth at bedtime.   meloxicam 7.5 MG tablet Commonly known as: MOBIC TAKE 1 TABLET BY MOUTH ONCE DAILY.   metoprolol succinate 25 MG 24 hr tablet Commonly known as: TOPROL-XL TAKE 1 TABLET BY MOUTH TWICE DAILY   multivitamin tablet Take 1 tablet by mouth daily.   nitrofurantoin (macrocrystal-monohydrate) 100 MG capsule Commonly known as: MACROBID Take 1 capsule (100 mg total) by mouth daily.   Omega-3 1000 MG Caps Take by mouth.   omeprazole 20 MG capsule Commonly known as: PRILOSEC TAKE 1 CAPSULE BY MOUTH TWICE DAILY FOR REFLUX.   pantoprazole 40  MG tablet Commonly known as: PROTONIX TAKE 1 TABLET BY MOUTH ONCE DAILY.   rosuvastatin 20 MG tablet Commonly known as: CRESTOR TAKE 1 TABLET BY MOUTH ONCE DAILY.        Allergies:  Allergies  Allergen Reactions   Sulfa Antibiotics Nausea Only   Vesicare [Solifenacin] Other (See Comments)    Constipation Constipation    Family History: Family History  Problem Relation Age of Onset   Diabetes Father    Stroke Father    Colon polyps Father    Stroke Mother    Parkinson's disease Mother     Social History:  reports that she has never smoked. She has never used smokeless tobacco. She reports that she does not drink  alcohol and does not use drugs.  ROS:                                        Physical Exam: BP 131/73    Pulse 68    Ht 5\' 4"  (1.626 m)    Wt 81.2 kg    LMP 12/18/1981    BMI 30.73 kg/m   Constitutional:  Alert and oriented, No acute distress. HEENT: [place] AT, moist mucus membranes.  Trachea midline, no masses.   Laboratory Data: Lab Results  Component Value Date   WBC 7.1 11/07/2021   HGB 11.6 (L) 11/07/2021   HCT 35.3 (L) 11/07/2021   MCV 92.0 11/07/2021   PLT 241.0 11/07/2021    Lab Results  Component Value Date   CREATININE 1.05 11/07/2021    No results found for: PSA  No results found for: TESTOSTERONE  Lab Results  Component Value Date   HGBA1C 5.8 11/07/2021    Urinalysis    Component Value Date/Time   COLORURINE YELLOW 10/17/2019 1608   APPEARANCEUR Clear 06/13/2021 1059   LABSPEC 1.020 10/17/2019 1608   LABSPEC 1.006 10/08/2014 1745   PHURINE 6.5 10/17/2019 1608   GLUCOSEU Negative 06/13/2021 1059   GLUCOSEU NEGATIVE 08/23/2018 1412   HGBUR NEGATIVE 10/17/2019 1608   BILIRUBINUR Negative 06/13/2021 1059   BILIRUBINUR Negative 10/08/2014 [place] 10/17/2019 1608   PROTEINUR Negative 06/13/2021 [place] 10/17/2019 1608   UROBILINOGEN 0.2 06/11/2020 1516    UROBILINOGEN 0.2 08/23/2018 1412   NITRITE Negative 06/13/2021 1059   NITRITE NEGATIVE 10/17/2019 1608   LEUKOCYTESUR Trace (A) 06/13/2021 1059   LEUKOCYTESUR NEGATIVE 10/17/2019 1608   LEUKOCYTESUR Trace 10/08/2014 1745    Pertinent Imaging:   Assessment & Plan: Send urine for culture.  Renew Macrodantin 90x3 and I will see near  There are no diagnoses linked to this encounter.  No follow-ups on file.  Reece Packer, MD  [facility] [address], [place], [place] [address] [phone]

## 2022-01-19 LAB — CULTURE, URINE COMPREHENSIVE

## 2022-01-25 NOTE — Progress Notes (Signed)
PROVIDER NOTE: Information contained herein reflects review and annotations entered in association with encounter. Interpretation of such information and data should be left to medically-trained personnel. Information provided to patient can be located elsewhere in the medical record under "Patient Instructions". Document created using STT-dictation technology, any transcriptional errors that may result from process are unintentional.    Patient: Wanda Martinez  Service Category: E/M  Provider: Gaspar Cola, MD  DOB: 1934-12-28  DOS: 01/26/2022  Specialty: Interventional Pain Management  MRN: [id number]  Setting: Ambulatory outpatient  PCP: Wanda Pheasant, MD  Type: Established Patient    Referring Provider: Einar Pheasant, MD  Location: Office  Delivery: Face-to-face     HPI  [name], a 87 y.o. year old female, is here today because of her Lumbar radicular pain [M54.16]. [name] primary complain today is Back Pain (Low and right, left side is doing much better.) Last encounter: My last encounter with her was on 01/10/2022. Pertinent problems: [name] has Primary cancer of upper inner quadrant of right female breast ([place]); Headache; Parkinson's disease ([place]); Chronic low back pain (1ry area of Pain) (Bilateral) (R>L) w/o sciatica; Lumbar spondylosis; Chronic hip pain (Bilateral); Chronic neck pain; Cervical spondylosis; Chronic cervical radicular pain (Right); Diffuse myofascial pain syndrome; Neurogenic pain; Chronic upper back pain (Right); Myofascial pain syndrome (Right) (cervicothoracic); Lumbar facet syndrome (Bilateral) (R>L); Malignant neoplasm of right female breast ([place]); Cervical facet hypertrophy; Cervical facet syndrome ([place]); Chronic shoulder pain (Right); Chronic pain syndrome; Chronic sacroiliac joint pain (Left); Chronic sacroiliac joint pain (Right); 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, L4-5, L5-S1) (Right); Lumbar spinal stenosis (with neurogenic  claudication) (L3-4); Chronic lower extremity pain (2ry area of Pain) (Right); Chronic lumbar radicular pain (S1) (Right); Trochanteric bursitis of hip (Bilateral); Spondylosis without myelopathy or radiculopathy, lumbosacral region; Trigger point with back pain (Right); DDD (degenerative disc disease), lumbosacral; Chronic upper extremity pain (Right); Chronic thoracic back pain (Bilateral) (L>R); Trigger point of thoracic region (Bilateral) (L>R); Other specified dorsopathies, sacral and sacrococcygeal region; Sacroiliac joint dysfunction (Right); Osteoarthritis of sacroiliac joint (Right); Somatic dysfunction of sacroiliac joint (Right); Chronic musculoskeletal pain; Facial pain; Chronic hip pain (Right); Osteoarthritis of hip (Right); Left ear pain; 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[place]); Abnormal MRI, lumbar spine (12/30/2019); Osteoarthritis involving multiple joints; Other spondylosis, sacral and sacrococcygeal region; Lumbar facet hypertrophy (Multilevel) (Bilateral); Chronic knee pain (Right); Osteoarthritis of knee (Right); Trigger point of shoulder region (Right); Osteoarthritis of AC (acromioclavicular) joint (Right); Osteoarthritis of shoulder (Right); Unspecified injury of muscle(s) and tendon(s) of the rotator cuff of shoulder, sequela (Right); Cervicalgia; DDD (degenerative disc disease), lumbar; Lumbar radicular pain (Bilateral); and Lumbar foraminal stenosis (Right: L3-4, L4-5, L5-S1) (Left: L2-3) on their pertinent problem list. Pain Assessment: Severity of Chronic pain is reported as a 7 /10. Location: Back Lower, Right/right leg ( calf and ankle have throbbing type pain). Onset: More than a month ago. Quality: Throbbing, Aching, Constant. Timing: Constant. Modifying factor(s): positioing, heat, medications, topicals. Vitals:  height is _0  (1.626 m) and weight is 181 lb (82.1 kg). Her temporal temperature is 97.1 F (36.2 C) (abnormal). Her pulse is 61. Her respiration is 18 and oxygen  saturation is 99%.   Reason for encounter: post-procedure evaluation and assessment.  The patient indicates that the bilateral L2 transforaminal epidural steroid injection provided her with 80% relief of her low back pain and lower extremity pain, for the duration of the local anesthetic.  More specifically, she indicates that it provided her with an ongoing 100%  relief of her left sided low back pain and 100% ongoing relief of her left lower extremity pain that she was experiencing in the L2 dermatome (anterior thigh).  In terms of her right lower back and right lower extremity pain, she refers that she is still having that pain in the lower portion of the back, on the right side, in an area that I believe corresponds to her right L5-S1 facet joint.  However, she describes that their worst pain is to her right lower extremity pain which seems to go down the posterior aspect of the leg all the way down to the lateral aspect of the foot and what appears to be an S1 dermatomal distribution.  Her last MRI done on 12/30/2019 indicated that she has an L5-S1 bulging disc more prominent towards the right side with facet degeneration and hypertrophy, more pronounced on the right.  There is also foraminal narrowing on the right that could possibly affect the exiting L5 nerve.  This appears to have worsened slightly since 2018.  She refers her worst pain to currently being in the area of the right calf.  According to this MRI the primary area where there is seems to be some compression is at the L5-S1 neuroforamen on the right side.  For this reason, we will be bringing the patient back for a right transforaminal epidural steroid injection.  At this point, it is very clear to me that her left-sided symptoms originated at the L2 level.  It is my suspicion that on the right side the etiology of her pain is at the L5-S1 level.  Post-procedure evaluation    Type: Trans-Foraminal Epidural Steroid Injection (Lumbar) #1   Laterality: Bilateral  Level: L2   Imaging: Fluoroscopic guidance Anesthesia: Local anesthesia (1-2% Lidocaine) Anxiolysis: IV Versed 2.0 mg Sedation: None. DOS: 01/10/2022  Performed by: Wanda Cola, MD  Purpose: Diagnostic/Therapeutic Indications: Lumbar radicular pain severe enough to impact quality of life or function. 1. DDD (degenerative disc disease), lumbar   2. Lumbar radicular pain (Bilateral)   3. 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 Lumbar spinal stenosis (with neurogenic claudication) (L3-4)   5. Chronic low back pain (1ry area of Pain) (Bilateral) (R>L) w/o sciatica   6. Chronic hip pain (Bilateral)   7. Abnormal MRI, lumbar spine (12/30/2019)    NAS-11 Pain score:   Pre-procedure: 9 /10   Post-procedure: 0-No pain/10      Effectiveness:  Initial hour after procedure: 80 %. Subsequent 4-6 hours post-procedure: 80 %. Analgesia past initial 6 hours:  (right side is 0, left side is 95). Ongoing improvement:  Analgesic: 100% ongoing relief of the left lower back and lower extremity pain.  On the right side, she is having quite a bit of pain in the right lower back and right lower extremity, which she describes as being worse than before the block. Function: Somewhat improved, mainly on the left side. ROM: Somewhat improved, mainly on the left side.  Pharmacotherapy Assessment  Analgesic: Hydrocodone/APAP 5/325, 1 tab PO q 8 hrs (15 mg/day of hydrocodone) MME/day: 15 mg/day.   Monitoring: [place] PMP: PDMP reviewed during this encounter.       Pharmacotherapy: No side-effects or adverse reactions reported. Compliance: No problems identified. Effectiveness: Clinically acceptable.  No notes on file  UDS:  Summary  Date Value Ref Range Status  06/01/2021 Note  Final    Comment:    ==================================================================== [employer] [facility]  (MW) ==================================================================== Test  Result       Flag       Units  Drug Present and Declared for Prescription Verification   Hydrocodone                    255          EXPECTED   ng/mg creat   Dihydrocodeine                 113          EXPECTED   ng/mg creat   Norhydrocodone                 1235         EXPECTED   ng/mg creat    Sources of hydrocodone include scheduled prescription medications.    Dihydrocodeine and norhydrocodone are expected metabolites of    hydrocodone. Dihydrocodeine is also available as a scheduled    prescription medication.  ==================================================================== Test                      Result    Flag   Units      Ref Range   Creatinine              97               mg/dL      >=20 ==================================================================== Declared Medications:  The flagging and interpretation on this report are based on the  following declared medications.  Unexpected results may arise from  inaccuracies in the declared medications.   **Note: The testing scope of this panel includes these medications:   Hydrocodone (Norco)   **Note: The testing scope of this panel does not include the  following reported medications:   Acetaminophen (Norco)  Amlodipine (Norvasc)  Aspirin  Calcium  Citalopram (Celexa)  Cyclobenzaprine (Flexeril)  Donepezil (Aricept)  Gabapentin (Neurontin)  Glucosamine  Hydrochlorothiazide (Hydrodiuril)  Letrozole (Femara)  Losartan (Cozaar)  Lubiprostone (Amitiza)  Magnesium (Mag-Ox)  Melatonin  Meloxicam (Mobic)  Metoprolol (Toprol)  Multivitamin  Omeprazole (Prilosec)  Pantoprazole (Protonix)  Pravastatin (Pravachol) ==================================================================== For clinical consultation, please call [phone]. ====================================================================       ROS  Constitutional: Denies any fever or chills Gastrointestinal: No reported hemesis, hematochezia, vomiting, or acute GI distress Musculoskeletal: Denies any acute onset joint swelling, redness, loss of ROM, or weakness Neurological: No reported episodes of acute onset apraxia, aphasia, dysarthria, agnosia, amnesia, paralysis, loss of coordination, or loss of consciousness  Medication Review  Glucosamine Sulfate, Glucosamine-Chondroitin, HYDROcodone-acetaminophen, Omega-3, amLODipine, aspirin, calcium carbonate, citalopram, cyclobenzaprine, donepezil, gabapentin, hydrochlorothiazide, letrozole, losartan, lubiprostone, magnesium oxide, melatonin, meloxicam, metoprolol succinate, multivitamin, nitrofurantoin (macrocrystal-monohydrate), omeprazole, pantoprazole, and rosuvastatin  History Review  Allergy: Ms. Limes is allergic to sulfa antibiotics and vesicare [solifenacin]. Drug: Ms. Cino  reports no history of drug use. Alcohol:  reports no history of alcohol use. Tobacco:  reports that she has never smoked. She has never used smokeless tobacco. Social: Ms. Roesler  reports that she has never smoked. She has never used smokeless tobacco. She reports that she does not drink alcohol and does not use drugs. Medical:  has a past medical history of Acute postoperative pain (10/25/2017), Anemia, Arm pain (07/26/2015), Arthritis, Arthritis, degenerative (03/26/2014), Back pain (11/01/2013), Bladder infection (06/2018), Breast cancer ([place]), Breast cancer ([place]), CHEST PAIN (04/29/2010), Chronic cystitis, Cystocele (02/22/2013), Cystocele, midline (08/19/2013), Degeneration of intervertebral disc of lumbosacral region (03/26/2014), DYSPNEA (04/29/2010), Enthesopathy of hip (03/26/2014), GERD (gastroesophageal reflux disease), Hiatal  hernia, HTN (hypertension), Hypokalemia (06/2018), Hyponatremia (06/2018), LBP (low back pain) (03/26/2014), Neck pain (11/01/2013), Parkinson disease ([place]), Pneumonia (06/2018), Sinusitis  (02/07/2015), Skin lesions (07/12/2014), and Urinary incontinence. Surgical: Ms. Merk  has a past surgical history that includes Tonsillectomy and adenoidectomy (81); Vesicovaginal fistula closure w/ TAH (1983); Breast surgery (1986); Breast enhancement surgery (1987); Breast implant removal; Breast implant removal (Right, 08/29/2012); Mastectomy (08/2012); Abdominal hysterectomy; Colonoscopy with propofol (N/A, 09/13/2016); and Breast biopsy (2013). Family: family history includes Colon polyps in her father; Diabetes in her father; Parkinson's disease in her mother; Stroke in her father and mother.  Laboratory Chemistry Profile   Renal Lab Results  Component Value Date   BUN 16 11/07/2021   CREATININE 1.05 11/07/2021   BCR 20 06/11/2020   GFR 48.95 (L) 11/07/2021   GFRAA >60 07/27/2020   GFRNONAA 57 (L) 08/03/2021    Hepatic Lab Results  Component Value Date   AST 22 11/07/2021   ALT 15 11/07/2021   ALBUMIN 4.3 11/07/2021   ALKPHOS 37 (L) 11/07/2021    Electrolytes Lab Results  Component Value Date   NA 134 (L) 11/07/2021   K 5.0 11/07/2021   CL 97 11/07/2021   CALCIUM 9.7 11/07/2021   MG 2.2 07/17/2018    Bone Lab Results  Component Value Date   25OHVITD1 42 07/17/2018   25OHVITD2 <1.0 07/17/2018   25OHVITD3 42 07/17/2018    Inflammation (CRP: Acute Phase) (ESR: Chronic Phase) Lab Results  Component Value Date   CRP 3 07/17/2018   ESRSEDRATE 27 07/17/2018         Note: Above Lab results reviewed.  Recent Imaging Review  DG PAIN CLINIC C-ARM 1-60 MIN NO REPORT Fluoro was used, but no Radiologist interpretation will be provided.  Please refer to "NOTES" tab for provider progress note. Note: Reviewed        Physical Exam  General appearance: Well nourished, well developed, and well hydrated. In no apparent acute distress Mental status: Alert, oriented x 3 (person, place, & time)       Respiratory: No evidence of acute respiratory distress Eyes: PERLA Vitals:  Pulse 61    Temp (!) 97.1 F (36.2 C) (Temporal)    Resp 18    Ht _0  (1.626 m)    Wt 181 lb (82.1 kg)    LMP 12/18/1981    SpO2 99%    BMI 31.07 kg/m  BMI: Estimated body mass index is 31.07 kg/m as calculated from the following:   Height as of this encounter: _1  (1.626 m).   Weight as of this encounter: 181 lb (82.1 kg). Ideal: Ideal body weight: 54.7 kg (120 lb 9.5 oz) Adjusted ideal body weight: 65.7 kg (144 lb 12.1 oz)  Assessment   Status Diagnosis  Controlled Controlled Controlled 1. Lumbar radicular pain (Bilateral)   2. Chronic low back pain (1ry area of Pain) (Bilateral) (R>L) w/o sciatica   3. Chronic hip pain (Bilateral)   4. Lumbar foraminal stenosis (Right: L3-4, L4-5, L5-S1) (Left: L2-3)   5. Lumbar spinal stenosis (with neurogenic claudication) (L3-4)   6. DDD (degenerative disc disease), lumbar   7. Abnormal MRI, lumbar spine (12/30/2019)   8. Chronic lumbar radicular pain (S1) (Right)      Updated Problems: No problems updated.  Plan of Care  Problem-specific:  No problem-specific Assessment & Plan notes found for this encounter.  Ms. Magdaline Zollars has a current medication list which includes the following long-term medication(s): amlodipine, calcium carbonate, citalopram,  cyclobenzaprine, donepezil, gabapentin, hydrochlorothiazide, hydrocodone-acetaminophen, [START ON 01/27/2022] hydrocodone-acetaminophen, [START ON 02/26/2022] hydrocodone-acetaminophen, losartan, magnesium oxide, magnesium oxide, meloxicam, metoprolol succinate, omeprazole, pantoprazole, and rosuvastatin.  Pharmacotherapy (Medications Ordered): No orders of the defined types were placed in this encounter.  Orders:  Orders Placed This Encounter  Procedures   Lumbar Transforaminal Epidural    Standing Status:   Future    Standing Expiration Date:   04/25/2022    Scheduling Instructions:     Side: Right-sided     Level: L5     Sedation: No Sedation.     Timeframe: ASAP    Order  Specific Question:   Where will this procedure be performed?    Answer:   [facility]   Follow-up plan:   Return for (Clinic) procedure: (R) L5 TFESI, (NS).     Interventional Therapies  Risk   Complexity Considerations:   Estimated body mass index is 30.73 kg/m as calculated from the following:   Height as of this encounter: $RemoveBeforeD'5\' 4"'[id number]$  (1.626 m).   Weight as of this encounter: 179 lb (81.2 kg). WNL   Planned   Pending:   Diagnostic/therapeutic right L5 TFESI #4    Under consideration:   Diagnostic right genicular NB  Diagnostic bilateral cervical facet MBB    Completed:   Therapeutic right superior/lateral trapezius muscle TPI x1 (04/19/2021)  Therapeutic right thoracic back TPI x2 (10/17/2018)  (DKFUA)  Therapeutic left thoracic back TPI x1 (10/17/2018)  (DKFUA)  Therapeutic right quadratus lumborum and erector spinae muscle TPI x1 (02/12/2018)  Therapeutic bilateral lumbar facet MBB x5 (08/09/2021) (DKFUA)  Therapeutic right lumbar facet MBB x4 (12/21/2020) (DKFUA)  Therapeutic right lumbar facet RFA x2 (06/04/2018) (100/90/75/75-100)  Therapeutic left lumbar facet RFA x1 (02/12/2018) (100/60/90/>75)  Diagnostic right SI joint Blk x3 (12/21/2020)  (DKFUA)  Diagnostic left SI joint Blk x1 (08/09/2017) (100/90/20/<25)  Therapeutic right SI joint RFA x3 (06/05/2019) (100/100/85/>50)  Therapeutic midline L2-3 LESI x1 (10/04/2017) (100/100/85/>50)  Therapeutic right L3-4 LESI x2 (11/04/2020) (100/100/20/<50)  Therapeutic right L4-5 LESI x1 (05/20/2020) (100/100/80/>75)  Therapeutic right L5-S1 LESI x2 (12/04/2019) (100/100/50/75)  Therapeutic right L5 TFESI x3 (12/04/2019) (100/100/50/75)  Therapeutic right S1 TFESI x1 (11/04/2020) (100/100/20/<50)  Diagnostic bilateral superficial trochanteric bursa injec. x1 (04/30/2018) (DKFUA)  Diagnostic right IA hip injection x1 (02/13/2019) (DKFUA)  Diagnostic/therapeutic right (steroid) knee injection x1 (04/19/2021) (100/100/50/50)   Therapeutic right ([place]) knee injection x1 (10/06/2021)  (DKFUA)  Therapeutic right ([place]) knee injection x1 (06/07/2021) (100/100/100/75)    Therapeutic   Palliative (PRN) options:   Therapeutic/palliative lumbar facet RFA  Therapeutic/palliative lumbar facet MBB  Therapeutic left L2 TFESI (this level is responsible for her low back pain and left lower extremity pain.)    Recent Visits Date Type Provider Dept  01/10/22 Procedure visit Milinda Pointer, MD [facility]  12/26/21 Office Visit Milinda Pointer, MD [facility]  Showing recent visits within past 90 days and meeting all other requirements Today's Visits Date Type Provider Dept  01/26/22 Office Visit Milinda Pointer, MD [facility]  Showing today's visits and meeting all other requirements Future Appointments Date Type Provider Dept  03/20/22 Appointment Milinda Pointer, MD [facility]  Showing future appointments within next 90 days and meeting all other requirements  I discussed the assessment and treatment plan with the patient. The patient was provided an opportunity to ask questions and all were answered. The patient agreed with the plan and demonstrated an understanding of the instructions.  Patient advised to call  back or seek an in-person evaluation if the symptoms or condition worsens.  Duration of encounter: 30 minutes.  Note by: Wanda Cola, MD Date: 01/26/2022; Time: 2:57 PM

## 2022-01-26 ENCOUNTER — Other Ambulatory Visit: Payer: Self-pay

## 2022-01-26 ENCOUNTER — Encounter: Payer: Self-pay | Admitting: Pain Medicine

## 2022-01-26 ENCOUNTER — Ambulatory Visit: Payer: Medicare Other | Attending: Pain Medicine | Admitting: Pain Medicine

## 2022-01-26 VITALS — HR 61 | Temp 97.1°F | Resp 18 | Ht 64.0 in | Wt 181.0 lb

## 2022-01-26 DIAGNOSIS — M5410 Radiculopathy, site unspecified: Secondary | ICD-10-CM | POA: Diagnosis not present

## 2022-01-26 DIAGNOSIS — M48062 Spinal stenosis, lumbar region with neurogenic claudication: Secondary | ICD-10-CM

## 2022-01-26 DIAGNOSIS — G8929 Other chronic pain: Secondary | ICD-10-CM | POA: Diagnosis not present

## 2022-01-26 DIAGNOSIS — M51369 Other intervertebral disc degeneration, lumbar region without mention of lumbar back pain or lower extremity pain: Secondary | ICD-10-CM

## 2022-01-26 DIAGNOSIS — M25551 Pain in right hip: Secondary | ICD-10-CM | POA: Insufficient documentation

## 2022-01-26 DIAGNOSIS — M48061 Spinal stenosis, lumbar region without neurogenic claudication: Secondary | ICD-10-CM | POA: Diagnosis not present

## 2022-01-26 DIAGNOSIS — M25552 Pain in left hip: Secondary | ICD-10-CM | POA: Diagnosis not present

## 2022-01-26 DIAGNOSIS — R937 Abnormal findings on diagnostic imaging of other parts of musculoskeletal system: Secondary | ICD-10-CM

## 2022-01-26 DIAGNOSIS — M5416 Radiculopathy, lumbar region: Secondary | ICD-10-CM

## 2022-01-26 DIAGNOSIS — M5136 Other intervertebral disc degeneration, lumbar region: Secondary | ICD-10-CM | POA: Diagnosis not present

## 2022-01-26 DIAGNOSIS — M545 Low back pain, unspecified: Secondary | ICD-10-CM | POA: Insufficient documentation

## 2022-01-26 NOTE — Patient Instructions (Addendum)
______________________________________________________________________ ° °Preparing for your procedure (without sedation) ° °Procedure appointments are limited to planned procedures: °No Prescription Refills. °No disability issues will be discussed. °No medication changes will be discussed. ° °Instructions: °Oral Intake: Do not eat or drink anything for at least 6 hours prior to your procedure. (Exception: Blood Pressure Medication. See below.) °Transportation: Unless otherwise stated by your physician, you may drive yourself after the procedure. °Blood Pressure Medicine: Do not forget to take your blood pressure medicine with a sip of water the morning of the procedure. If your Diastolic (lower reading)is above 100 mmHg, elective cases will be cancelled/rescheduled. °Blood thinners: These will need to be stopped for procedures. Notify our staff if you are taking any blood thinners. Depending on which one you take, there will be specific instructions on how and when to stop it. °Diabetics on insulin: Notify the staff so that you can be scheduled 1st case in the morning. If your diabetes requires high dose insulin, take only ½ of your normal insulin dose the morning of the procedure and notify the staff that you have done so. °Preventing infections: Shower with an antibacterial soap the morning of your procedure.  °Build-up your immune system: Take 1000 mg of Vitamin C with every meal (3 times a day) the day prior to your procedure. °Antibiotics: Inform the staff if you have a condition or reason that requires you to take antibiotics before dental procedures. °Pregnancy: If you are pregnant, call and cancel the procedure. °Sickness: If you have a cold, fever, or any active infections, call and cancel the procedure. °Arrival: You must be in the facility at least 30 minutes prior to your scheduled procedure. °Children: Do not bring any children with you. °Dress appropriately: Bring dark clothing that you would not mind  if they get stained. °Valuables: Do not bring any jewelry or valuables. ° °Reasons to call and reschedule or cancel your procedure: (Following these recommendations will minimize the risk of a serious complication.) °Surgeries: Avoid having procedures within 2 weeks of any surgery. (Avoid for 2 weeks before or after any surgery). °Flu Shots: Avoid having procedures within 2 weeks of a flu shots or . (Avoid for 2 weeks before or after immunizations). °Barium: Avoid having a procedure within 7-10 days after having had a radiological study involving the use of radiological contrast. (Myelograms, Barium swallow or enema study). °Heart attacks: Avoid any elective procedures or surgeries for the initial 6 months after a "Myocardial Infarction" (Heart Attack). °Blood thinners: It is imperative that you stop these medications before procedures. Let us know if you if you take any blood thinner.  °Infection: Avoid procedures during or within two weeks of an infection (including chest colds or gastrointestinal problems). Symptoms associated with infections include: Localized redness, fever, chills, night sweats or profuse sweating, burning sensation when voiding, cough, congestion, stuffiness, runny nose, sore throat, diarrhea, nausea, vomiting, cold or Flu symptoms, recent or current infections. It is specially important if the infection is over the area that we intend to treat. °Heart and lung problems: Symptoms that may suggest an active cardiopulmonary problem include: cough, chest pain, breathing difficulties or shortness of breath, dizziness, ankle swelling, uncontrolled high or unusually low blood pressure, and/or palpitations. If you are experiencing any of these symptoms, cancel your procedure and contact your primary care physician for an evaluation. ° °Remember:  °Regular Business hours are:  °Monday to Thursday 8:00 AM to 4:00 PM ° °Provider's Schedule: °Francisco Naveira, MD:  °Procedure days: Tuesday and Thursday    7:30 AM to 4:00 PM  Gillis Santa, MD:  Procedure days: Monday and Wednesday 7:30 AM to 4:00 PM ______________________________________________________________________  ____________________________________________________________________________________________  General Risks and Possible Complications  Patient Responsibilities: It is important that you read this as it is part of your informed consent. It is our duty to inform you of the risks and possible complications associated with treatments offered to you. It is your responsibility as a patient to read this and to ask questions about anything that is not clear or that you believe was not covered in this document.  Patients Rights: You have the right to refuse treatment. You also have the right to change your mind, even after initially having agreed to have the treatment done. However, under this last option, if you wait until the last second to change your mind, you may be charged for the materials used up to that point.  Introduction: Medicine is not an Chief Strategy Officer. Everything in Medicine, including the lack of treatment(s), carries the potential for danger, harm, or loss (which is by definition: Risk). In Medicine, a complication is a secondary problem, condition, or disease that can aggravate an already existing one. All treatments carry the risk of possible complications. The fact that a side effects or complications occurs, does not imply that the treatment was conducted incorrectly. It must be clearly understood that these can happen even when everything is done following the highest safety standards.  No treatment: You can choose not to proceed with the proposed treatment alternative. The PRO(s) would include: avoiding the risk of complications associated with the therapy. The CON(s) would include: not getting any of the treatment benefits. These benefits fall under one of three categories: diagnostic; therapeutic; and/or palliative.  Diagnostic benefits include: getting information which can ultimately lead to improvement of the disease or symptom(s). Therapeutic benefits are those associated with the successful treatment of the disease. Finally, palliative benefits are those related to the decrease of the primary symptoms, without necessarily curing the condition (example: decreasing the pain from a flare-up of a chronic condition, such as incurable terminal cancer).  General Risks and Complications: These are associated to most interventional treatments. They can occur alone, or in combination. They fall under one of the following six (6) categories: no benefit or worsening of symptoms; bleeding; infection; nerve damage; allergic reactions; and/or death. No benefits or worsening of symptoms: In Medicine there are no guarantees, only probabilities. No healthcare provider can ever guarantee that a medical treatment will work, they can only state the probability that it may. Furthermore, there is always the possibility that the condition may worsen, either directly, or indirectly, as a consequence of the treatment. Bleeding: This is more common if the patient is taking a blood thinner, either prescription or over the counter (example: Goody Powders, Fish oil, Aspirin, Garlic, etc.), or if suffering a condition associated with impaired coagulation (example: Hemophilia, cirrhosis of the liver, low platelet counts, etc.). However, even if you do not have one on these, it can still happen. If you have any of these conditions, or take one of these drugs, make sure to notify your treating physician. Infection: This is more common in patients with a compromised immune system, either due to disease (example: diabetes, cancer, human immunodeficiency virus [HIV], etc.), or due to medications or treatments (example: therapies used to treat cancer and rheumatological diseases). However, even if you do not have one on these, it can still happen. If you  have any of these conditions, or take  one of these drugs, make sure to notify your treating physician. Nerve Damage: This is more common when the treatment is an invasive one, but it can also happen with the use of medications, such as those used in the treatment of cancer. The damage can occur to small secondary nerves, or to large primary ones, such as those in the spinal cord and brain. This damage may be temporary or permanent and it may lead to impairments that can range from temporary numbness to permanent paralysis and/or brain death. Allergic Reactions: Any time a substance or material comes in contact with our body, there is the possibility of an allergic reaction. These can range from a mild skin rash (contact dermatitis) to a severe systemic reaction (anaphylactic reaction), which can result in death. Death: In general, any medical intervention can result in death, most of the time due to an unforeseen complication. ____________________________________________________________________________________________ Pain Management Discharge Instructions  General Discharge Instructions :  If you need to reach your doctor call: Monday-Friday 8:00 am - 4:00 pm at [phone] or toll free [phone].  After clinic hours [phone] to have operator reach doctor.  Bring all of your medication bottles to all your appointments in the pain clinic.  To cancel or reschedule your appointment with Pain Management please remember to call 24 hours in advance to avoid a fee.  Refer to the educational materials which you have been given on: General Risks, I had my Procedure. Discharge Instructions, Post Sedation.  Post Procedure Instructions:  The drugs you were given will stay in your system until tomorrow, so for the next 24 hours you should not drive, make any legal decisions or drink any alcoholic beverages.  You may eat anything you prefer, but it is better to start with liquids then soups and  crackers, and gradually work up to solid foods.  Please notify your doctor immediately if you have any unusual bleeding, trouble breathing or pain that is not related to your normal pain.  Depending on the type of procedure that was done, some parts of your body may feel week and/or numb.  This usually clears up by tonight or the next day.  Walk with the use of an assistive device or accompanied by an adult for the 24 hours.  You may use ice on the affected area for the first 24 hours.  Put ice in a Ziploc bag and cover with a towel and place against area 15 minutes on 15 minutes off.  You may switch to heat after 24 hours.Selective Nerve Root Block Patient Information  Description: Specific nerve roots exit the spinal canal and these nerves can be compressed and inflamed by a bulging disc and bone spurs.  By injecting steroids on the nerve root, we can potentially decrease the inflammation surrounding these nerves, which often leads to decreased pain.  Also, by injecting local anesthesia on the nerve root, this can provide [place] helpful information to give to your referring doctor if it decreases your pain.  Selective nerve root blocks can be done along the spine from the neck to the low back depending on the location of your pain.   After numbing the skin with local anesthesia, a small needle is passed to the nerve root and the position of the needle is verified using x-ray pictures.  After the needle is in correct position, we then deposit the medication.  You may experience a pressure sensation while this is being done.  The entire block usually lasts less than  15 minutes.  Conditions that may be treated with selective nerve root blocks: Low back and leg pain Spinal stenosis Diagnostic block prior to potential surgery Neck and arm pain Post laminectomy syndrome  Preparation for the injection:  Do not eat any solid food or dairy products within 8 hours of your appointment. You may drink clear  liquids up to 3 hours before an appointment.  Clear liquids include water, black coffee, juice or soda.  No milk or cream please. You may take your regular medications, including pain medications, with a sip of water before your appointment.  Diabetics should hold regular insulin (if taken separately) and take 1/2 normal NPH dose the morning of the procedure.  Carry some sugar containing items with you to your appointment. A driver must accompany you and be prepared to drive you home after your procedure. Bring all your current medications with you. An IV may be inserted and sedation may be given at the discretion of the physician. A blood pressure cuff, EKG, and other monitors will often be applied during the procedure.  Some patients may need to have extra oxygen administered for a short period. You will be asked to provide medical information, including allergies, prior to the procedure.  We must know immediately if you are taking blood  Thinners (like Coumadin) or if you are allergic to IV iodine contrast (dye).  Possible side-effects: All are usually temporary Bleeding from needle site Light headedness Numbness and tingling Decreased blood pressure Weakness in arms/legs Pressure sensation in back/neck Pain at injection site (several days)  Possible complications: All are extremely rare Infection Nerve injury Spinal headache (a headache wore with upright position)  Call if you experience: Fever/chills associated with headache or increased back/neck pain Headache worsened by an upright position New onset weakness or numbness of an extremity below the injection site Hives or difficulty breathing (go to the emergency room) Inflammation or drainage at the injection site(s) Severe back/neck pain greater than usual New symptoms which are concerning to you  Please note:  Although the local anesthetic injected can often make your back or neck feel good for several hours after the  injection the pain will likely return.  It takes 3-5 days for steroids to work on the nerve root. You may not notice any pain relief for at least one week.  If effective, we will often do a series of 3 injections spaced 3-6 weeks apart to maximally decrease your pain.    If you have any questions, please call [phone] [facility]

## 2022-01-27 ENCOUNTER — Telehealth: Payer: Self-pay | Admitting: Emergency Medicine

## 2022-01-27 ENCOUNTER — Other Ambulatory Visit: Payer: Self-pay | Admitting: Oncology

## 2022-01-27 NOTE — Telephone Encounter (Signed)
Per secure chat "effective 12/18/2021 with [employer]-medicare prolia and xgeva are non preferred. zometa and aredia are the preferred."  Dr Grayland Ormond changed treament plan for pt to receive Zometa. Pt was called and informed of change and verbalized understanding.

## 2022-02-02 ENCOUNTER — Other Ambulatory Visit: Payer: Self-pay | Admitting: Internal Medicine

## 2022-02-02 DIAGNOSIS — M7918 Myalgia, other site: Secondary | ICD-10-CM

## 2022-02-03 ENCOUNTER — Inpatient Hospital Stay: Payer: Medicare Other | Admitting: Nurse Practitioner

## 2022-02-03 ENCOUNTER — Other Ambulatory Visit: Payer: Self-pay | Admitting: Emergency Medicine

## 2022-02-03 ENCOUNTER — Other Ambulatory Visit: Payer: Medicare Other

## 2022-02-03 ENCOUNTER — Inpatient Hospital Stay: Payer: Medicare Other

## 2022-02-03 ENCOUNTER — Other Ambulatory Visit: Payer: Self-pay

## 2022-02-03 ENCOUNTER — Ambulatory Visit: Payer: Medicare Other

## 2022-02-03 ENCOUNTER — Ambulatory Visit: Payer: Medicare Other | Admitting: Oncology

## 2022-02-03 DIAGNOSIS — C50211 Malignant neoplasm of upper-inner quadrant of right female breast: Secondary | ICD-10-CM

## 2022-02-06 ENCOUNTER — Other Ambulatory Visit: Payer: Self-pay

## 2022-02-06 ENCOUNTER — Inpatient Hospital Stay: Payer: Medicare Other | Attending: Nurse Practitioner

## 2022-02-06 ENCOUNTER — Inpatient Hospital Stay: Payer: Medicare Other | Admitting: Nurse Practitioner

## 2022-02-06 ENCOUNTER — Ambulatory Visit: Payer: Medicare Other

## 2022-02-06 ENCOUNTER — Inpatient Hospital Stay: Payer: Medicare Other

## 2022-02-06 ENCOUNTER — Encounter: Payer: Self-pay | Admitting: Nurse Practitioner

## 2022-02-06 VITALS — BP 115/63 | HR 60 | Temp 97.2°F | Resp 18 | Wt 182.6 lb

## 2022-02-06 DIAGNOSIS — Z08 Encounter for follow-up examination after completed treatment for malignant neoplasm: Secondary | ICD-10-CM | POA: Diagnosis not present

## 2022-02-06 DIAGNOSIS — C50211 Malignant neoplasm of upper-inner quadrant of right female breast: Secondary | ICD-10-CM

## 2022-02-06 DIAGNOSIS — M810 Age-related osteoporosis without current pathological fracture: Secondary | ICD-10-CM

## 2022-02-06 DIAGNOSIS — Z853 Personal history of malignant neoplasm of breast: Secondary | ICD-10-CM | POA: Diagnosis not present

## 2022-02-06 DIAGNOSIS — Z9013 Acquired absence of bilateral breasts and nipples: Secondary | ICD-10-CM | POA: Diagnosis not present

## 2022-02-06 LAB — BASIC METABOLIC PANEL
Anion gap: 8 (ref 5–15)
BUN: 20 mg/dL (ref 8–23)
CO2: 27 mmol/L (ref 22–32)
Calcium: 9.3 mg/dL (ref 8.9–10.3)
Chloride: 99 mmol/L (ref 98–111)
Creatinine, Ser: 1.28 mg/dL — ABNORMAL HIGH (ref 0.44–1.00)
GFR, Estimated: 41 mL/min — ABNORMAL LOW (ref >60)
Glucose, Bld: 102 mg/dL — ABNORMAL HIGH (ref 70–99)
Potassium: 4 mmol/L (ref 3.5–5.1)
Sodium: 134 mmol/L — ABNORMAL LOW (ref 135–145)

## 2022-02-06 MED ORDER — SODIUM CHLORIDE 0.9 % IV SOLN
Freq: Once | INTRAVENOUS | Status: AC
  Filled 2022-02-06: qty 250

## 2022-02-06 MED ORDER — ZOLEDRONIC ACID 4 MG/5ML IV CONC
3.3000 mg | Freq: Once | INTRAVENOUS | Status: AC
  Administered 2022-02-06: 3.3 mg via INTRAVENOUS
  Filled 2022-02-06: qty 4.13

## 2022-02-06 NOTE — Patient Instructions (Signed)
Zoledronic Acid Injection (Hypercalcemia, Oncology) ?What is this medication? ?ZOLEDRONIC ACID (ZOE le dron ik AS id) slows calcium loss from bones. It high calcium levels in the blood from some kinds of cancer. It may be used in other people at risk for bone loss. ?This medicine may be used for other purposes; ask your health care provider or pharmacist if you have questions. ?COMMON BRAND NAME(S): Zometa ?What should I tell my care team before I take this medication? ?They need to know if you have any of these conditions: ?cancer ?dehydration ?dental disease ?kidney disease ?liver disease ?low levels of calcium in the blood ?lung or breathing disease (asthma) ?receiving steroids like dexamethasone or prednisone ?an unusual or allergic reaction to zoledronic acid, other medicines, foods, dyes, or preservatives ?pregnant or trying to get pregnant ?breast-feeding ?How should I use this medication? ?This drug is injected into a vein. It is given by a health care provider in a hospital or clinic setting. ?Talk to your health care provider about the use of this drug in children. Special care may be needed. ?Overdosage: If you think you have taken too much of this medicine contact a poison control center or emergency room at once. ?NOTE: This medicine is only for you. Do not share this medicine with others. ?What if I miss a dose? ?Keep appointments for follow-up doses. It is important not to miss your dose. Call your health care provider if you are unable to keep an appointment. ?What may interact with this medication? ?certain antibiotics given by injection ?NSAIDs, medicines for pain and inflammation, like ibuprofen or naproxen ?some diuretics like bumetanide, furosemide ?teriparatide ?thalidomide ?This list may not describe all possible interactions. Give your health care provider a list of all the medicines, herbs, non-prescription drugs, or dietary supplements you use. Also tell them if you smoke, drink alcohol, or  use illegal drugs. Some items may interact with your medicine. ?What should I watch for while using this medication? ?Visit your health care provider for regular checks on your progress. It may be some time before you see the benefit from this drug. ?Some people who take this drug have severe bone, joint, or muscle pain. This drug may also increase your risk for jaw problems or a broken thigh bone. Tell your health care provider right away if you have severe pain in your jaw, bones, joints, or muscles. Tell you health care provider if you have any pain that does not go away or that gets worse. ?Tell your dentist and dental surgeon that you are taking this drug. You should not have major dental surgery while on this drug. See your dentist to have a dental exam and fix any dental problems before starting this drug. Take good care of your teeth while on this drug. Make sure you see your dentist for regular follow-up appointments. ?You should make sure you get enough calcium and vitamin D while you are taking this drug. Discuss the foods you eat and the vitamins you take with your health care provider. ?Check with your health care provider if you have severe diarrhea, nausea, and vomiting, or if you sweat a lot. The loss of too much body fluid may make it dangerous for you to take this drug. ?You may need blood work done while you are taking this drug. ?Do not become pregnant while taking this drug. Women should inform their health care provider if they wish to become pregnant or think they might be pregnant. There is potential for serious   harm to an unborn child. Talk to your health care provider for more information. ?What side effects may I notice from receiving this medication? ?Side effects that you should report to your doctor or health care provider as soon as possible: ?allergic reactions (skin rash, itching or hives; swelling of the face, lips, or tongue) ?bone pain ?infection (fever, chills, cough, sore  throat, pain or trouble passing urine) ?jaw pain, especially after dental work ?joint pain ?kidney injury (trouble passing urine or change in the amount of urine) ?low blood pressure (dizziness; feeling faint or lightheaded, falls; unusually weak or tired) ?low calcium levels (fast heartbeat; muscle cramps or pain; pain, tingling, or numbness in the hands or feet; seizures) ?low magnesium levels (fast, irregular heartbeat; muscle cramp or pain; muscle weakness; tremors; seizures) ?low red blood cell counts (trouble breathing; feeling faint; lightheaded, falls; unusually weak or tired) ?muscle pain ?redness, blistering, peeling, or loosening of the skin, including inside the mouth ?severe diarrhea ?swelling of the ankles, feet, hands ?trouble breathing ?Side effects that usually do not require medical attention (report to your doctor or health care provider if they continue or are bothersome): ?anxious ?constipation ?coughing ?depressed mood ?eye irritation, itching, or pain ?fever ?general ill feeling or flu-like symptoms ?nausea ?pain, redness, or irritation at site where injected ?trouble sleeping ?This list may not describe all possible side effects. Call your doctor for medical advice about side effects. You may report side effects to FDA at 1-800-FDA-1088. ?Where should I keep my medication? ?This drug is given in a hospital or clinic. It will not be stored at home. ?NOTE: This sheet is a summary. It may not cover all possible information. If you have questions about this medicine, talk to your doctor, pharmacist, or health care provider. ?? 2022 [employer] (2021-08-23 00:00:00) ? ?

## 2022-02-06 NOTE — Progress Notes (Unsigned)
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  INTERVAL HISTORY: Patient returns to clinic today for routine 60-month evaluation and continuation of Prolia.  She is having increased back pain today and has an appointment in pain clinic next week.  She otherwise feels well and is asymptomatic.  She does not complain of any weakness or fatigue.  She has no new neurologic complaints.  She denies any recent fevers or illnesses.  She has a good appetite and denies weight loss.  She denies any chest pain, shortness of breath, cough, or hemoptysis.  She denies any nausea, vomiting, constipation, or diarrhea.  She has no urinary complaints.  Patient offers no further specific complaints today.  REVIEW OF SYSTEMS:   Review of Systems  Constitutional: Negative.  Negative for fever, malaise/fatigue and weight loss.  Respiratory: Negative.  Negative for cough and shortness of breath.   Cardiovascular: Negative.  Negative for chest pain and leg swelling.  Gastrointestinal: Negative.  Negative for abdominal pain.  Genitourinary: Negative.   Musculoskeletal:  Positive for back pain. Negative for joint pain.  Skin: Negative.  Negative for rash.  Neurological:  Positive for tremors. Negative for sensory change, speech change, focal weakness and weakness.  Psychiatric/Behavioral: Negative.  Negative for depression. The patient is not nervous/anxious.    As per HPI. Otherwise, a complete review of systems is negative.  PAST MEDICAL HISTORY: Past Medical History:  Diagnosis Date   Acute postoperative pain 10/25/2017   Anemia    Arm pain 07/26/2015   Arthritis    Arthritis, degenerative 03/26/2014    Back pain 11/01/2013   Bladder infection 06/2018   Breast cancer ([place])    Masectomy - left - 1986    Breast cancer [facility])    Mastectomy-right -2014   CHEST PAIN 04/29/2010   Qualifier: Diagnosis of  By: Wynetta Emery RN, Erika     Chronic cystitis    Cystocele 02/22/2013   Cystocele, midline 08/19/2013   Degeneration of intervertebral disc of lumbosacral region 03/26/2014   DYSPNEA 04/29/2010   Qualifier: Diagnosis of  By: Wynetta Emery RN, Erika     Enthesopathy of hip 03/26/2014   GERD (gastroesophageal reflux disease)    Hiatal hernia    HTN (hypertension)    Hypokalemia 06/2018   Hyponatremia 06/2018   LBP (low back pain) 03/26/2014   Neck pain 11/01/2013   Parkinson disease ([place])    Pneumonia 06/2018   Sinusitis 02/07/2015   Skin lesions 07/12/2014   Urinary incontinence    mixed     PAST SURGICAL HISTORY: Past Surgical History:  Procedure Laterality Date   ABDOMINAL HYSTERECTOMY     BREAST BIOPSY  2013   BREAST ENHANCEMENT SURGERY  1987   BREAST IMPLANT REMOVAL     BREAST IMPLANT REMOVAL Right 08/29/2012   BREAST SURGERY  1986   s/p left mastectomy   COLONOSCOPY WITH PROPOFOL N/A 09/13/2016   Procedure: COLONOSCOPY WITH PROPOFOL;  Surgeon: Manya Silvas, MD;  [facility]: [facility] ENDOSCOPY;  Service: Endoscopy;  Laterality: N/A;   MASTECTOMY  08/2012   right   TONSILLECTOMY AND ADENOIDECTOMY  66   VESICOVAGINAL FISTULA CLOSURE W/ TAH  1983    FAMILY HISTORY Family History  Problem Relation Age of Onset  Diabetes Father    Stroke Father    Colon polyps Father    Stroke Mother    Parkinson's disease Mother        ADVANCED DIRECTIVES:    HEALTH MAINTENANCE: Social History   Tobacco Use   Smoking status: Never   Smokeless tobacco: Never   Tobacco comments:    tobacco use - no  Vaping Use   Vaping Use: Never used  Substance Use Topics   Alcohol use: No    Alcohol/week: 0.0 standard drinks   Drug use: No     Allergies  Allergen Reactions   Sulfa Antibiotics Nausea  Only   Vesicare [Solifenacin] Other (See Comments)    Constipation Constipation    Current Outpatient Medications  Medication Sig Dispense Refill   amLODipine (NORVASC) 2.5 MG tablet TAKE 1 TABLET BY MOUTH ONCE DAILY. 30 tablet 11   aspirin 81 MG EC tablet Take 81 mg by mouth daily as needed.     calcium carbonate (OSCAL) 1500 (600 Ca) MG TABS tablet Take 1 tablet by mouth daily.     citalopram (CELEXA) 20 MG tablet TAKE 1 TABLET BY MOUTH ONCE DAILY. 90 tablet 1   cyclobenzaprine (FLEXERIL) 10 MG tablet TAKE ONE TABLET BY MOUTH AT BEDTIME. 30 tablet 0   donepezil (ARICEPT) 5 MG tablet      gabapentin (NEURONTIN) 300 MG capsule TAKE (2) CAPSULES BY MOUTH TWICE DAILY 120 capsule 0   GLUCOSAMINE SULFATE PO Take by mouth daily.      Glucosamine-Chondroitin 500-400 MG CAPS Take 2 capsules by mouth daily.     hydrochlorothiazide (HYDRODIURIL) 25 MG tablet TAKE 1 TABLET BY MOUTH ONCE DAILY. (PATIENT TAKES 1/2 ONCE DAILY AND EXTRA 1/2 IF NEEDED) 15 tablet 11   HYDROcodone-acetaminophen (NORCO/VICODIN) 5-325 MG tablet Take 1 tablet by mouth 3 (three) times daily. Must last 30 days. 90 tablet 0   [START ON 02/26/2022] HYDROcodone-acetaminophen (NORCO/VICODIN) 5-325 MG tablet Take 1 tablet by mouth 3 (three) times daily. Must last 30 days. 90 tablet 0   letrozole (FEMARA) 2.5 MG tablet Take 2.5 mg by mouth daily.     losartan (COZAAR) 50 MG tablet TAKE (2) TABLETS BY MOUTH ONCE DAILY. 60 tablet 11   lubiprostone (AMITIZA) 24 MCG capsule TAKE 1 CAPSULE BY MOUTH ONCE DAILY WITH BREAKFAST.     magnesium oxide (MAG-OX) 400 (240 Mg) MG tablet TAKE 1 TABLET BY MOUTH ONCE DAILY. 30 tablet 2   magnesium oxide (MAG-OX) 400 (241.3 Mg) MG tablet TAKE 1 TABLET BY MOUTH ONCE DAILY. 30 tablet 11   melatonin 1 MG TABS tablet Take 3 mg by mouth at bedtime.     meloxicam (MOBIC) 7.5 MG tablet TAKE 1 TABLET BY MOUTH ONCE DAILY. 30 tablet 4   metoprolol succinate (TOPROL-XL) 25 MG 24 hr tablet TAKE 1 TABLET BY MOUTH  TWICE DAILY 60 tablet 11   Multiple Vitamin (MULTIVITAMIN) tablet Take 1 tablet by mouth daily.     nitrofurantoin, macrocrystal-monohydrate, (MACROBID) 100 MG capsule Take 1 capsule (100 mg total) by mouth daily. 30 capsule 11   Omega-3 1000 MG CAPS Take by mouth.     omeprazole (PRILOSEC) 20 MG capsule TAKE 1 CAPSULE BY MOUTH TWICE DAILY FOR REFLUX. 60 capsule 1   pantoprazole (PROTONIX) 40 MG tablet TAKE 1 TABLET BY MOUTH ONCE DAILY. 30 tablet 11   rosuvastatin (CRESTOR) 20 MG tablet TAKE 1 TABLET BY MOUTH ONCE DAILY. 90 tablet 1   HYDROcodone-acetaminophen (NORCO/VICODIN) 5-325 MG tablet Take 1  tablet by mouth 3 (three) times daily. Must last 30 days. 90 tablet 0   No current facility-administered medications for this visit.    OBJECTIVE: Vitals:   02/06/22 1337  BP: 115/63  Pulse: 60  Resp: 18  Temp: (!) 97.2 F (36.2 C)  SpO2: 99%     Body mass index is 31.34 kg/m.    ECOG FS:0 - Asymptomatic  General: Well-developed, well-nourished, no acute distress. Eyes: Pink conjunctiva, anicteric sclera. HEENT: Normocephalic, moist mucous membranes. Lungs: No audible wheezing or coughing. Heart: Regular rate and rhythm. Abdomen: Soft, nontender, no obvious distention. Musculoskeletal: No edema, cyanosis, or clubbing. Neuro: Alert, answering all questions appropriately. Cranial nerves grossly intact. Skin: No rashes or petechiae noted. Psych: Normal affect.  LAB RESULTS:  Lab Results  Component Value Date   NA 134 (L) 02/06/2022   K 4.0 02/06/2022   CL 99 02/06/2022   CO2 27 02/06/2022   GLUCOSE 102 (H) 02/06/2022   BUN 20 02/06/2022   CREATININE 1.28 (H) 02/06/2022   CALCIUM 9.3 02/06/2022   PROT 7.2 11/07/2021   ALBUMIN 4.3 11/07/2021   AST 22 11/07/2021   ALT 15 11/07/2021   ALKPHOS 37 (L) 11/07/2021   BILITOT 0.4 11/07/2021   GFRNONAA 41 (L) 02/06/2022   GFRAA >60 07/27/2020    Lab Results  Component Value Date   WBC 7.1 11/07/2021   NEUTROABS 4.5 11/07/2021    HGB 11.6 (L) 11/07/2021   HCT 35.3 (L) 11/07/2021   MCV 92.0 11/07/2021   PLT 241.0 11/07/2021     STUDIES: DG PAIN CLINIC C-ARM 1-60 MIN NO REPORT  Result Date: 01/10/2022 Fluoro was used, but no Radiologist interpretation will be provided. Please refer to "NOTES" tab for provider progress note.   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  PLAN:    1.  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  Patient completed 5 years of letrozole in May 2019.  No further interventions are needed.  Patient has had bilateral mastectomies, therefore no further mammograms are necessary.  Return to clinic as scheduled for Prolia. Prolia no longer covered by insurance. Zometa preferred. Plan for zometa 4 mg once every 3 months for 2 years.   2.  Osteoporosis: Patient's most recent bone mineral density on July 04, 2021 revealed continuous improvement of her T score at -2.6.  Previously T score was reported at -2.9.  Proceed with zometa as scheduled today. Continue calcium and vitamin D supplementation.  Return to clinic in 3 months for repeat laboratory work, further evaluation, and continuation of treatment.  Repeat bone mineral density in July 2023.  3.  Tremors/Parkinson's: Chronic and unchanged.  Treatment per neurology.   4.  Back pain: Patient reports she has an appointment in pain clinic next week.  Patient expressed understanding and was in agreement with this plan. She also understands that She can call clinic at any time with any questions, concerns, or complaints.   Breast cancer [facility])   Staging form: Breast, AJCC 7th Edition     Clinical stage from 09/22/2015: Stage IIA (T1c, N1, M0) - Signed by Lloyd Huger, MD on 09/22/2015  Verlon Au, NP 02/06/2022

## 2022-02-07 ENCOUNTER — Telehealth: Payer: Self-pay

## 2022-02-07 NOTE — Telephone Encounter (Signed)
I called the patient and scheduled her for a TFESI on 2/28. She was telling me that she had an infusion and was reading about it and it said no steroids before the infusion. She is worried it may interfere with her procedure. Can someone call her about this?

## 2022-02-07 NOTE — Telephone Encounter (Signed)
Attempted to call patient, unable to leave a message. I was going to suggest that she call her physician who administers the infusion medication, as they will be more familiar with this.

## 2022-02-08 ENCOUNTER — Telehealth: Payer: Self-pay | Admitting: Pain Medicine

## 2022-02-08 NOTE — Telephone Encounter (Signed)
Left voicemail asking patient to call physician who manages her infusions to answer this question.

## 2022-02-08 NOTE — Telephone Encounter (Signed)
Patient had an infusion on 02-07-22. She wants to know if this will interfere with her upcoming procedure. Please call patient asap

## 2022-02-14 ENCOUNTER — Ambulatory Visit (HOSPITAL_BASED_OUTPATIENT_CLINIC_OR_DEPARTMENT_OTHER): Payer: Medicare Other | Admitting: Pain Medicine

## 2022-02-14 ENCOUNTER — Ambulatory Visit
Admission: RE | Admit: 2022-02-14 | Discharge: 2022-02-14 | Disposition: A | Discharge status: Home / Self Care | Payer: Medicare Other | Source: Ambulatory Visit | Attending: Pain Medicine | Admitting: Pain Medicine

## 2022-02-14 ENCOUNTER — Telehealth: Payer: Self-pay

## 2022-02-14 ENCOUNTER — Other Ambulatory Visit: Payer: Self-pay

## 2022-02-14 VITALS — BP 136/74 | HR 57 | Temp 97.3°F | Resp 18 | Ht 64.0 in | Wt 180.0 lb

## 2022-02-14 DIAGNOSIS — M545 Low back pain, unspecified: Secondary | ICD-10-CM | POA: Insufficient documentation

## 2022-02-14 DIAGNOSIS — M51369 Other intervertebral disc degeneration, lumbar region without mention of lumbar back pain or lower extremity pain: Secondary | ICD-10-CM

## 2022-02-14 DIAGNOSIS — M5137 Other intervertebral disc degeneration, lumbosacral region: Secondary | ICD-10-CM | POA: Insufficient documentation

## 2022-02-14 DIAGNOSIS — M48061 Spinal stenosis, lumbar region without neurogenic claudication: Secondary | ICD-10-CM

## 2022-02-14 DIAGNOSIS — M5136 Other intervertebral disc degeneration, lumbar region: Secondary | ICD-10-CM | POA: Diagnosis not present

## 2022-02-14 DIAGNOSIS — R937 Abnormal findings on diagnostic imaging of other parts of musculoskeletal system: Secondary | ICD-10-CM

## 2022-02-14 DIAGNOSIS — M5416 Radiculopathy, lumbar region: Secondary | ICD-10-CM

## 2022-02-14 DIAGNOSIS — M5410 Radiculopathy, site unspecified: Secondary | ICD-10-CM | POA: Diagnosis not present

## 2022-02-14 DIAGNOSIS — M48062 Spinal stenosis, lumbar region with neurogenic claudication: Secondary | ICD-10-CM | POA: Diagnosis not present

## 2022-02-14 DIAGNOSIS — Z5189 Encounter for other specified aftercare: Secondary | ICD-10-CM | POA: Diagnosis not present

## 2022-02-14 DIAGNOSIS — M47816 Spondylosis without myelopathy or radiculopathy, lumbar region: Secondary | ICD-10-CM | POA: Diagnosis not present

## 2022-02-14 DIAGNOSIS — M4807 Spinal stenosis, lumbosacral region: Secondary | ICD-10-CM | POA: Diagnosis not present

## 2022-02-14 DIAGNOSIS — M51379 Other intervertebral disc degeneration, lumbosacral region without mention of lumbar back pain or lower extremity pain: Secondary | ICD-10-CM

## 2022-02-14 DIAGNOSIS — M79604 Pain in right leg: Secondary | ICD-10-CM

## 2022-02-14 DIAGNOSIS — G8929 Other chronic pain: Secondary | ICD-10-CM | POA: Insufficient documentation

## 2022-02-14 MED ORDER — ROPIVACAINE HCL 2 MG/ML IJ SOLN
1.0000 mL | Freq: Once | INTRAMUSCULAR | Status: AC
  Administered 2022-02-14: 1 mL via EPIDURAL
  Filled 2022-02-14: qty 20

## 2022-02-14 MED ORDER — PENTAFLUOROPROP-TETRAFLUOROETH EX AERO
INHALATION_SPRAY | Freq: Once | CUTANEOUS | Status: DC
  Filled 2022-02-14: qty 116

## 2022-02-14 MED ORDER — IOHEXOL 180 MG/ML  SOLN
10.0000 mL | Freq: Once | INTRAMUSCULAR | Status: AC
  Administered 2022-02-14: 5 mL via INTRATHECAL
  Filled 2022-02-14: qty 20

## 2022-02-14 MED ORDER — SODIUM CHLORIDE 0.9% FLUSH
1.0000 mL | Freq: Once | INTRAVENOUS | Status: AC
  Administered 2022-02-14: 1 mL

## 2022-02-14 MED ORDER — DEXAMETHASONE SODIUM PHOSPHATE 10 MG/ML IJ SOLN
10.0000 mg | Freq: Once | INTRAMUSCULAR | Status: AC
  Administered 2022-02-14: 10 mg
  Filled 2022-02-14: qty 1

## 2022-02-14 MED ORDER — LIDOCAINE HCL 2 % IJ SOLN
20.0000 mL | Freq: Once | INTRAMUSCULAR | Status: AC
  Administered 2022-02-14: 100 mg
  Filled 2022-02-14: qty 40

## 2022-02-14 NOTE — Patient Instructions (Addendum)
____________________________________________________________________________________________ ° °Post-Procedure Discharge Instructions ° °Instructions: °Apply ice:  °Purpose: This will minimize any swelling and discomfort after procedure.  °When: Day of procedure, as soon as you get home. °How: Fill a plastic sandwich bag with crushed ice. Cover it with a small towel and apply to injection site. °How long: (15 min on, 15 min off) Apply for 15 minutes then remove x 15 minutes.  Repeat sequence on day of procedure, until you go to bed. °Apply heat:  °Purpose: To treat any soreness and discomfort from the procedure. °When: Starting the next day after the procedure. °How: Apply heat to procedure site starting the day following the procedure. °How long: May continue to repeat daily, until discomfort goes away. °Food intake: Start with clear liquids (like water) and advance to regular food, as tolerated.  °Physical activities: Keep activities to a minimum for the first 8 hours after the procedure. After that, then as tolerated. °Driving: If you have received any sedation, be responsible and do not drive. You are not allowed to drive for 24 hours after having sedation. °Blood thinner: (Applies only to those taking blood thinners) You may restart your blood thinner 6 hours after your procedure. °Insulin: (Applies only to Diabetic patients taking insulin) As soon as you can eat, you may resume your normal dosing schedule. °Infection prevention: Keep procedure site clean and dry. Shower daily and clean area with soap and water. °Post-procedure Pain Diary: Extremely important that this be done correctly and accurately. Recorded information will be used to determine the next step in treatment. For the purpose of accuracy, follow these rules: °Evaluate only the area treated. Do not report or include pain from an untreated area. For the purpose of this evaluation, ignore all other areas of pain, except for the treated area. °After  your procedure, avoid taking a long nap and attempting to complete the pain diary after you wake up. Instead, set your alarm clock to go off every hour, on the hour, for the initial 8 hours after the procedure. Document the duration of the numbing medicine, and the relief you are getting from it. °Do not go to sleep and attempt to complete it later. It will not be accurate. If you received sedation, it is likely that you were given a medication that may cause amnesia. Because of this, completing the diary at a later time may cause the information to be inaccurate. This information is needed to plan your care. °Follow-up appointment: Keep your post-procedure follow-up evaluation appointment after the procedure (usually 2 weeks for most procedures, 6 weeks for radiofrequencies). DO NOT FORGET to bring you pain diary with you.  ° °Expect: (What should I expect to see with my procedure?) °From numbing medicine (AKA: Local Anesthetics): Numbness or decrease in pain. You may also experience some weakness, which if present, could last for the duration of the local anesthetic. °Onset: Full effect within 15 minutes of injected. °Duration: It will depend on the type of local anesthetic used. On the average, 1 to 8 hours.  °From steroids (Applies only if steroids were used): Decrease in swelling or inflammation. Once inflammation is improved, relief of the pain will follow. °Onset of benefits: Depends on the amount of swelling present. The more swelling, the longer it will take for the benefits to be seen. In some cases, up to 10 days. °Duration: Steroids will stay in the system x 2 weeks. Duration of benefits will depend on multiple posibilities including persistent irritating factors. °Side-effects: If present, they   may typically last 2 weeks (the duration of the steroids). Frequent: Cramps (if they occur, drink Gatorade and take over-the-counter Magnesium 450-500 mg once to twice a day); water retention with temporary  weight gain; increases in blood sugar; decreased immune system response; increased appetite. Occasional: Facial flushing (red, warm cheeks); mood swings; menstrual changes. Uncommon: Long-term decrease or suppression of natural hormones; bone thinning. (These are more common with higher doses or more frequent use. This is why we prefer that our patients avoid having any injection therapies in other practices.)  Very Rare: Severe mood changes; psychosis; aseptic necrosis. From procedure: Some discomfort is to be expected once the numbing medicine wears off. This should be minimal if ice and heat are applied as instructed.  Call if: (When should I call?) You experience numbness and weakness that gets worse with time, as opposed to wearing off. New onset bowel or bladder incontinence. (Applies only to procedures done in the spine)  Emergency Numbers: Durning business hours (Monday - Thursday, 8:00 AM - 4:00 PM) (Friday, 9:00 AM - 12:00 Noon): [phone] After hours: [phone] NOTE: If you are having a problem and are unable connect with, or to talk to a provider, then go to your nearest urgent care or emergency department. If the problem is serious and urgent, please call 911. ____________________________________________________________________________________________  Selective Nerve Root Block Patient Information  Description: Specific nerve roots exit the spinal canal and these nerves can be compressed and inflamed by a bulging disc and bone spurs.  By injecting steroids on the nerve root, we can potentially decrease the inflammation surrounding these nerves, which often leads to decreased pain.  Also, by injecting local anesthesia on the nerve root, this can provide [place] helpful information to give to your referring doctor if it decreases your pain.  Selective nerve root blocks can be done along the spine from the neck to the low back depending on the location of your pain.   After numbing  the skin with local anesthesia, a small needle is passed to the nerve root and the position of the needle is verified using x-ray pictures.  After the needle is in correct position, we then deposit the medication.  You may experience a pressure sensation while this is being done.  The entire block usually lasts less than 15 minutes.  Conditions that may be treated with selective nerve root blocks: Low back and leg pain Spinal stenosis Diagnostic block prior to potential surgery Neck and arm pain Post laminectomy syndrome  Preparation for the injection:  Do not eat any solid food or dairy products within 8 hours of your appointment. You may drink clear liquids up to 3 hours before an appointment.  Clear liquids include water, black coffee, juice or soda.  No milk or cream please. You may take your regular medications, including pain medications, with a sip of water before your appointment.  Diabetics should hold regular insulin (if taken separately) and take 1/2 normal NPH dose the morning of the procedure.  Carry some sugar containing items with you to your appointment. A driver must accompany you and be prepared to drive you home after your procedure. Bring all your current medications with you. An IV may be inserted and sedation may be given at the discretion of the physician. A blood pressure cuff, EKG, and other monitors will often be applied during the procedure.  Some patients may need to have extra oxygen administered for a short period. You will be asked to provide  medical information, including allergies, prior to the procedure.  We must know immediately if you are taking blood  Thinners (like Coumadin) or if you are allergic to IV iodine contrast (dye).  Possible side-effects: All are usually temporary Bleeding from needle site Light headedness Numbness and tingling Decreased blood pressure Weakness in arms/legs Pressure sensation in back/neck Pain at injection site (several  days)  Possible complications: All are extremely rare Infection Nerve injury Spinal headache (a headache wore with upright position)  Call if you experience: Fever/chills associated with headache or increased back/neck pain Headache worsened by an upright position New onset weakness or numbness of an extremity below the injection site Hives or difficulty breathing (go to the emergency room) Inflammation or drainage at the injection site(s) Severe back/neck pain greater than usual New symptoms which are concerning to you  Please note:  Although the local anesthetic injected can often make your back or neck feel good for several hours after the injection the pain will likely return.  It takes 3-5 days for steroids to work on the nerve root. You may not notice any pain relief for at least one week.  If effective, we will often do a series of 3 injections spaced 3-6 weeks apart to maximally decrease your pain.    If you have any questions, please call [phone] [facility] Clinic[facility] Discharge Instructions  General Discharge Instructions :  If you need to reach your doctor call: Monday-Friday 8:00 am - 4:00 pm at [phone] or toll free [phone].  After clinic hours [phone] to have operator reach doctor.  Bring all of your medication bottles to all your appointments in the pain clinic.  To cancel or reschedule your appointment with Pain Management please remember to call 24 hours in advance to avoid a fee.  Refer to the educational materials which you have been given on: General Risks, I had my Procedure. Discharge Instructions, Post Sedation.  Post Procedure Instructions:  The drugs you were given will stay in your system until tomorrow, so for the next 24 hours you should not drive, make any legal decisions or drink any alcoholic beverages.  You may eat anything you prefer, but it is better to start with liquids then soups  and crackers, and gradually work up to solid foods.  Please notify your doctor immediately if you have any unusual bleeding, trouble breathing or pain that is not related to your normal pain.  Depending on the type of procedure that was done, some parts of your body may feel week and/or numb.  This usually clears up by tonight or the next day.  Walk with the use of an assistive device or accompanied by an adult for the 24 hours.  You may use ice on the affected area for the first 24 hours.  Put ice in a Ziploc bag and cover with a towel and place against area 15 minutes on 15 minutes off.  You may switch to heat after 24 hours.

## 2022-02-14 NOTE — Progress Notes (Signed)
Safety precautions to be maintained throughout the outpatient stay will include: orient to surroundings, keep bed in low position, maintain call bell within reach at all times, provide assistance with transfer out of bed and ambulation.  

## 2022-02-14 NOTE — Progress Notes (Signed)
PROVIDER NOTE: Interpretation of information contained herein should be left to medically-trained personnel. Specific patient instructions are provided elsewhere under "Patient Instructions" section of medical record. This document was created in part using STT-dictation technology, any transcriptional errors that may result from this process are unintentional.  Patient: Wanda Martinez Type: Established DOB: 1935/07/06 MRN: [id number] PCP: Einar Pheasant, MD  Service: Procedure DOS: 02/14/2022 Setting: Ambulatory Location: Ambulatory outpatient facility Delivery: Face-to-face Provider: Gaspar Cola, MD Specialty: Interventional Pain Management Specialty designation: 09 Location: Outpatient facility Ref. Prov.: Milinda Pointer, MD    Primary Reason for Visit: Interventional Pain Management Treatment. CC: Back Pain   Procedure:           Type: Trans-Foraminal Epidural Steroid Injection (Lumbar) #4 (last done 12/04/2019) Laterality: Right  Level: L5           Imaging: Fluoroscopic guidance Anesthesia: Local anesthesia (1-2% Lidocaine) Anxiolysis: None                 Sedation: None. DOS: 02/14/2022  Performed by: Gaspar Cola, MD  Purpose: Therapeutic/Palliative Indications: Lumbar radicular pain severe enough to impact quality of life or function. 1. Chronic lower extremity pain (2ry area of Pain) (Right)   2. Chronic lumbar radicular pain (S1) (Right)   3. DDD (degenerative disc disease), lumbosacral   4. Lumbar foraminal stenosis (Right: L3-4, L4-5, L5-S1) (Left: L2-3)   5. Lumbar radicular pain (Bilateral)   6. Lumbar spinal stenosis (with neurogenic claudication) (L3-4)   7. Lumbar spondylosis   8. 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, L4-5, L5-S1) (Right)   9. Abnormal MRI, lumbar spine (12/30/2019)    NAS-11 Pain score:   Pre-procedure: 7 /10   Post-procedure: 3 /10     Position / Prep / Materials:  Position: Prone  Prep solution: DuraPrep  (Iodine Povacrylex [0.7% available iodine] and Isopropyl Alcohol, 74% w/w) Prep Area: Entire Posterior Lumbosacral Area.  From the lower tip of the scapula down to the tailbone and from flank to flank. Materials:  Tray: Block Needle(s):  Type: Spinal  Gauge (G): 22  Length: 5-in  Qty: 1  Pre-op H&P Assessment:  Wanda Martinez is a 87 y.o. (year old), female patient, seen today for interventional treatment. She  has a past surgical history that includes Tonsillectomy and adenoidectomy (81); Vesicovaginal fistula closure w/ TAH (1983); Breast surgery (1986); Breast enhancement surgery (1987); Breast implant removal; Breast implant removal (Right, 08/29/2012); Mastectomy (08/2012); Abdominal hysterectomy; Colonoscopy with propofol (N/A, 09/13/2016); and Breast biopsy (2013). Wanda Martinez has a current medication list which includes the following prescription(s): amlodipine, aspirin, calcium carbonate, citalopram, cyclobenzaprine, donepezil, gabapentin, glucosamine sulfate, glucosamine-chondroitin, hydrochlorothiazide, hydrocodone-acetaminophen, [START ON 02/26/2022] hydrocodone-acetaminophen, letrozole, losartan, lubiprostone, magnesium oxide, magnesium oxide, melatonin, meloxicam, metoprolol succinate, multivitamin, nitrofurantoin (macrocrystal-monohydrate), omega-3, omeprazole, pantoprazole, rosuvastatin, and hydrocodone-acetaminophen, and the following Facility-Administered Medications: pentafluoroprop-tetrafluoroeth. Her primarily concern today is the Back Pain  Initial Vital Signs:  Pulse/HCG Rate: (!) 57ECG Heart Rate: 62 Temp: (!) 97.3 F (36.3 C) Resp: 16 BP: 130/71 SpO2: 99 %  BMI: Estimated body mass index is 30.9 kg/m as calculated from the following:   Height as of this encounter: 5\' 4"  (1.626 m).   Weight as of this encounter: 180 lb (81.6 kg).  Risk Assessment: Allergies: Reviewed. She is allergic to sulfa antibiotics and vesicare [solifenacin].  Allergy Precautions: None  required Coagulopathies: Reviewed. None identified.  Blood-thinner therapy: None at this time Active Infection(s): Reviewed. None identified. Wanda Martinez is afebrile  Site Confirmation: Wanda Martinez was asked  to confirm the procedure and laterality before marking the site Procedure checklist: Completed Consent: Before the procedure and under the influence of no sedative(s), amnesic(s), or anxiolytics, the patient was informed of the treatment options, risks and possible complications. To fulfill our ethical and legal obligations, as recommended by the American Medical Association's Code of Ethics, I have informed the patient of my clinical impression; the nature and purpose of the treatment or procedure; the risks, benefits, and possible complications of the intervention; the alternatives, including doing nothing; the risk(s) and benefit(s) of the alternative treatment(s) or procedure(s); and the risk(s) and benefit(s) of doing nothing. The patient was provided information about the general risks and possible complications associated with the procedure. These may include, but are not limited to: failure to achieve desired goals, infection, bleeding, organ or nerve damage, allergic reactions, paralysis, and death. In addition, the patient was informed of those risks and complications associated to Spine-related procedures, such as failure to decrease pain; infection (i.e.: Meningitis, epidural or intraspinal abscess); bleeding (i.e.: epidural hematoma, subarachnoid hemorrhage, or any other type of intraspinal or peri-dural bleeding); organ or nerve damage (i.e.: Any type of peripheral nerve, nerve root, or spinal cord injury) with subsequent damage to sensory, motor, and/or autonomic systems, resulting in permanent pain, numbness, and/or weakness of one or several areas of the body; allergic reactions; (i.e.: anaphylactic reaction); and/or death. Furthermore, the patient was informed of those risks and  complications associated with the medications. These include, but are not limited to: allergic reactions (i.e.: anaphylactic or anaphylactoid reaction(s)); adrenal axis suppression; blood sugar elevation that in diabetics may result in ketoacidosis or comma; water retention that in patients with history of congestive heart failure may result in shortness of breath, pulmonary edema, and decompensation with resultant heart failure; weight gain; swelling or edema; medication-induced neural toxicity; particulate matter embolism and blood vessel occlusion with resultant organ, and/or nervous system infarction; and/or aseptic necrosis of one or more joints. Finally, the patient was informed that Medicine is not an exact science; therefore, there is also the possibility of unforeseen or unpredictable risks and/or possible complications that may result in a catastrophic outcome. The patient indicated having understood very clearly. We have given the patient no guarantees and we have made no promises. Enough time was given to the patient to ask questions, all of which were answered to the patient's satisfaction. Ms. Papania has indicated that she wanted to continue with the procedure. Attestation: I, the ordering provider, attest that I have discussed with the patient the benefits, risks, side-effects, alternatives, likelihood of achieving goals, and potential problems during recovery for the procedure that I have provided informed consent. Date   Time: 02/14/2022 10:50 AM  Pre-Procedure Preparation:  Monitoring: As per clinic protocol. Respiration, ETCO2, SpO2, BP, heart rate and rhythm monitor placed and checked for adequate function Safety Precautions: Patient was assessed for positional comfort and pressure points before starting the procedure. Time-out: I initiated and conducted the "Time-out" before starting the procedure, as per protocol. The patient was asked to participate by confirming the accuracy of the  "Time Out" information. Verification of the correct person, site, and procedure were performed and confirmed by me, the nursing staff, and the patient. "Time-out" conducted as per Joint Commission's Universal Protocol (UP.01.01.01). Time: 4128  Description/Narrative of Procedure:          Target: The 6 o'clock position under the pedicle, on the affected side. Region: Posterolateral Lumbosacral Approach: Posterior Percutaneous Paravertebral approach.  Rationale (medical necessity): procedure  needed and proper for the diagnosis and/or treatment of the patient's medical symptoms and needs. Procedural Technique Safety Precautions: Aspiration looking for blood return was conducted prior to all injections. At no point did we inject any substances, as a needle was being advanced. No attempts were made at seeking any paresthesias. Safe injection practices and needle disposal techniques used. Medications properly checked for expiration dates. SDV (single dose vial) medications used. Description of the Procedure: Protocol guidelines were followed. The patient was placed in position over the procedure table. The target area was identified and the area prepped in the usual manner. Skin & deeper tissues infiltrated with local anesthetic. Appropriate amount of time allowed to pass for local anesthetics to take effect. The procedure needles were then advanced to the target area. Proper needle placement secured. Negative aspiration confirmed. Solution injected in intermittent fashion, asking for systemic symptoms every 0.5cc of injectate. The needles were then removed and the area cleansed, making sure to leave some of the prepping solution back to take advantage of its long term bactericidal properties.  Vitals:   02/14/22 1049 02/14/22 1145 02/14/22 1150  BP: 130/71 138/78 136/74  Pulse: (!) 57    Resp: 16 18 18   Temp: (!) 97.3 F (36.3 C)    SpO2: 99% 100% 100%  Weight: 180 lb (81.6 kg)    Height: 5\' 4"   (1.626 m)      Start Time: 1145 hrs. End Time: 1149 hrs.  Imaging Guidance (Spinal):          Type of Imaging Technique: Fluoroscopy Guidance (Spinal) Indication(s): Assistance in needle guidance and placement for procedures requiring needle placement in or near specific anatomical locations not easily accessible without such assistance. Exposure Time: Please see nurses notes. Contrast: Before injecting any contrast, we confirmed that the patient did not have an allergy to iodine, shellfish, or radiological contrast. Once satisfactory needle placement was completed at the desired level, radiological contrast was injected. Contrast injected under live fluoroscopy. No contrast complications. See chart for type and volume of contrast used. Fluoroscopic Guidance: I was personally present during the use of fluoroscopy. "Tunnel Vision Technique" used to obtain the best possible view of the target area. Parallax error corrected before commencing the procedure. "Direction-depth-direction" technique used to introduce the needle under continuous pulsed fluoroscopy. Once target was reached, antero-posterior, oblique, and lateral fluoroscopic projection used confirm needle placement in all planes. Images permanently stored in EMR. Interpretation: I personally interpreted the imaging intraoperatively. Adequate needle placement confirmed in multiple planes. Appropriate spread of contrast into desired area was observed. No evidence of afferent or efferent intravascular uptake. No intrathecal or subarachnoid spread observed. Permanent images saved into the patient's record.  Antibiotic Prophylaxis:   Anti-infectives (From admission, onward)    None      Indication(s): None identified  Post-operative Assessment:  Post-procedure Vital Signs:  Pulse/HCG Rate: (!) 5760 Temp: (!) 97.3 F (36.3 C) Resp: 18 BP: 136/74 SpO2: 100 %  EBL: None  Complications: No immediate post-treatment complications  observed by team, or reported by patient.  Note: The patient tolerated the entire procedure well. A repeat set of vitals were taken after the procedure and the patient was kept under observation following institutional policy, for this type of procedure. Post-procedural neurological assessment was performed, showing return to baseline, prior to discharge. The patient was provided with post-procedure discharge instructions, including a section on how to identify potential problems. Should any problems arise concerning this procedure, the patient was given instructions to  immediately contact us, at any time, without hesitation. In any case, we plan to contact the patient by telephone for a follow-up status report regarding this interventional procedure.  Comments:  No additional relevant information.  Plan of Care  Orders:  Orders Placed This Encounter  Procedures   Lumbar Transforaminal Epidural    Scheduling Instructions:     Laterality: Right-sided     Level(s): L5             Sedation: Patient's choice.     Timeframe: Today    Order Specific Question:   Where will this procedure be performed?    Answer:   [facility]   DG [facility] C-ARM 1-60 MIN NO REPORT    Intraoperative interpretation by procedural physician at [facility].    Standing Status:   Standing    Number of Occurrences:   1    Order Specific Question:   Reason for exam:    Answer:   Assistance in needle guidance and placement for procedures requiring needle placement in or near specific anatomical locations not easily accessible without such assistance.   Informed Consent Details: Physician/Practitioner Attestation; Transcribe to consent form and obtain patient signature    Provider Attestation: I, Tukwila Dossie Arbour, MD, (Pain Management Specialist), the physician/practitioner, attest that I have discussed with the patient the benefits, risks, side effects, alternatives, likelihood of achieving goals  and potential problems during recovery for the procedure that I have provided informed consent.    Scheduling Instructions:     Note: Always confirm laterality of pain with Ms. Ermalinda Barrios, before procedure.     Transcribe to consent form and obtain patient signature.    Order Specific Question:   Physician/Practitioner attestation of informed consent for procedure/surgical case    Answer:   I, the physician/practitioner, attest that I have discussed with the patient the benefits, risks, side effects, alternatives, likelihood of achieving goals and potential problems during recovery for the procedure that I have provided informed consent.    Order Specific Question:   Procedure    Answer:   Diagnostic lumbar transforaminal epidural steroid injection under fluoroscopic guidance. (See notes for level and laterality.)    Order Specific Question:   Physician/Practitioner performing the procedure    Answer:   Tennis Mckinnon A. Dossie Arbour, MD    Order Specific Question:   Indication/Reason    Answer:   Lumbar radiculopathy/radiculitis associated with lumbar stenosis   Provide equipment / supplies at bedside    "Block Tray" (Disposable   single use) Needle type: SpinalSpinal Amount/quantity: 1 Size: Medium (5-inch) Gauge: 22G    Standing Status:   Standing    Number of Occurrences:   1    Order Specific Question:   Specify    Answer:   Block Tray   Chronic Opioid Analgesic:  Hydrocodone/APAP 5/325, 1 tab PO q 8 hrs (15 mg/day of hydrocodone) MME/day: 15 mg/day.   Medications ordered for procedure: Meds ordered this encounter  Medications   iohexol (OMNIPAQUE) 180 MG/ML injection 10 mL    Must be Myelogram-compatible. If not available, you may substitute with a water-soluble, non-ionic, hypoallergenic, myelogram-compatible radiological contrast medium.   lidocaine (XYLOCAINE) 2 % (with pres) injection 400 mg   pentafluoroprop-tetrafluoroeth (GEBAUERS) aerosol   sodium chloride flush (NS) 0.9 % injection  1 mL   ropivacaine (PF) 2 mg/mL (0.2%) (NAROPIN) injection 1 mL   dexamethasone (DECADRON) injection 10 mg   Medications administered: We administered iohexol, lidocaine, sodium chloride flush,  ropivacaine (PF) 2 mg/mL (0.2%), and dexamethasone.  See the medical record for exact dosing, route, and time of administration.  Follow-up plan:   Return in about 2 weeks (around 02/28/2022) for Proc-day (T,Th), (VV), (PPE).       Interventional Therapies  Risk   Complexity Considerations:   Estimated body mass index is 30.73 kg/m as calculated from the following:   Height as of this encounter: 5\' 4"  (1.626 m).   Weight as of this encounter: 179 lb (81.2 kg). WNL   Planned   Pending:   Diagnostic/therapeutic right L5 TFESI #4    Under consideration:   Diagnostic right genicular NB  Diagnostic bilateral cervical facet MBB    Completed:   Therapeutic right superior/lateral trapezius muscle TPI x1 (04/19/2021)  Therapeutic right thoracic back TPI x2 (10/17/2018)  (DKFUA)  Therapeutic left thoracic back TPI x1 (10/17/2018)  (DKFUA)  Therapeutic right quadratus lumborum and erector spinae muscle TPI x1 (02/12/2018)  Therapeutic bilateral lumbar facet MBB x5 (08/09/2021) (DKFUA)  Therapeutic right lumbar facet MBB x4 (12/21/2020) (DKFUA)  Therapeutic right lumbar facet RFA x2 (06/04/2018) (100/90/75/75-100)  Therapeutic left lumbar facet RFA x1 (02/12/2018) (100/60/90/>75)  Diagnostic right SI joint Blk x3 (12/21/2020)  (DKFUA)  Diagnostic left SI joint Blk x1 (08/09/2017) (100/90/20/<25)  Therapeutic right SI joint RFA x3 (06/05/2019) (100/100/85/>50)  Therapeutic midline L2-3 LESI x1 (10/04/2017) (100/100/85/>50)  Therapeutic right L3-4 LESI x2 (11/04/2020) (100/100/20/<50)  Therapeutic right L4-5 LESI x1 (05/20/2020) (100/100/80/>75)  Therapeutic right L5-S1 LESI x2 (12/04/2019) (100/100/50/75)  Therapeutic right L5 TFESI x3 (12/04/2019) (100/100/50/75)  Therapeutic right S1 TFESI x1  (11/04/2020) (100/100/20/<50)  Diagnostic bilateral superficial trochanteric bursa injec. x1 (04/30/2018) (DKFUA)  Diagnostic right IA hip injection x1 (02/13/2019) (DKFUA)  Diagnostic/therapeutic right (steroid) knee injection x1 (04/19/2021) (100/100/50/50)  Therapeutic right (Zilretta) knee injection x1 (10/06/2021)  (DKFUA)  Therapeutic right (Monovisc) knee injection x1 (06/07/2021) (100/100/100/75)    Therapeutic   Palliative (PRN) options:   Therapeutic/palliative lumbar facet RFA  Therapeutic/palliative lumbar facet MBB  Therapeutic left L2 TFESI (this level is responsible for her low back pain and left lower extremity pain.)     Recent Visits Date Type Provider Dept  01/26/22 Office Visit Milinda Pointer, MD [facility]  01/10/22 Procedure visit Milinda Pointer, MD [facility]  12/26/21 Office Visit Milinda Pointer, MD [facility]  Showing recent visits within past 90 days and meeting all other requirements Today's Visits Date Type Provider Dept  02/14/22 Procedure visit Milinda Pointer, MD [facility]  Showing today's visits and meeting all other requirements Future Appointments Date Type Provider Dept  02/28/22 Appointment Milinda Pointer, MD [facility]  03/20/22 Appointment Milinda Pointer, MD [facility]  Showing future appointments within next 90 days and meeting all other requirements  Disposition: Discharge home  Discharge (Date   Time): 02/14/2022; 1200 hrs.   Primary Care Physician: Einar Pheasant, MD Location: [facility] Note by: Gaspar Cola, MD Date: 02/14/2022; Time: 2:19 PM  Disclaimer:  Medicine is not an Chief Strategy Officer. The only guarantee in medicine is that nothing is guaranteed. It is important to note that the decision to proceed with this intervention was based on the information collected from the patient. The Data and conclusions were drawn  from the patient's questionnaire, the interview, and the physical examination. Because the information was provided in large part by the patient, it cannot be guaranteed that it has not been purposely or unconsciously manipulated. Every effort  has been made to obtain as much relevant data as possible for this evaluation. It is important to note that the conclusions that lead to this procedure are derived in large part from the available data. Always take into account that the treatment will also be dependent on availability of resources and existing treatment guidelines, considered by other Pain Management Practitioners as being common knowledge and practice, at the time of the intervention. For Medico-Legal purposes, it is also important to point out that variation in procedural techniques and pharmacological choices are the acceptable norm. The indications, contraindications, technique, and results of the above procedure should only be interpreted and judged by a Board-Certified Interventional Pain Specialist with extensive familiarity and expertise in the same exact procedure and technique.

## 2022-02-15 ENCOUNTER — Telehealth: Payer: Self-pay | Admitting: *Deleted

## 2022-02-15 NOTE — Telephone Encounter (Signed)
Post procedure call; patient states she did not get any sleep last night, states this happens every time after procedure but other than that she is doing fine.  ?

## 2022-02-24 ENCOUNTER — Ambulatory Visit: Payer: Medicare Other | Admitting: Surgical

## 2022-02-24 ENCOUNTER — Other Ambulatory Visit: Payer: Self-pay

## 2022-02-24 ENCOUNTER — Encounter: Payer: Self-pay | Admitting: Surgical

## 2022-02-24 DIAGNOSIS — M1711 Unilateral primary osteoarthritis, right knee: Secondary | ICD-10-CM | POA: Diagnosis not present

## 2022-02-24 MED ORDER — LIDOCAINE HCL 1 % IJ SOLN
5.0000 mL | INTRAMUSCULAR | Status: AC | PRN
  Administered 2022-02-24: 5 mL

## 2022-02-24 MED ORDER — BUPIVACAINE HCL 0.25 % IJ SOLN
4.0000 mL | INTRAMUSCULAR | Status: AC | PRN
  Administered 2022-02-24: 4 mL via INTRA_ARTICULAR

## 2022-02-24 MED ORDER — NITROFURANTOIN MONOHYD MACRO 100 MG PO CAPS: 100.0000 mg | ORAL_CAPSULE | Freq: Every day | ORAL | 3 refills | Status: DC

## 2022-02-24 MED ORDER — METHYLPREDNISOLONE ACETATE 40 MG/ML IJ SUSP
40.0000 mg | INTRAMUSCULAR | Status: AC | PRN
  Administered 2022-02-24: 40 mg via INTRA_ARTICULAR

## 2022-02-24 NOTE — Progress Notes (Signed)
Office Visit Note   Patient: Wanda Martinez           Date of Birth: 10-07-1935           MRN: [id number] Visit Date: 02/24/2022 Requested by: Milinda Pointer, [place] [address],  [place] [address] PCP: Einar Pheasant, MD  Subjective: Chief Complaint  Patient presents with   Right Knee - New Patient (Initial Visit)    HPI: Wanda Martinez is a 87 y.o. female who presents to the office complaining of right knee pain.  Patient has a history of right knee arthritis.  She has MRI from 01/04/2022 demonstrating high-grade partial-thickness cartilage loss of the patellofemoral compartment with more mild degenerative changes of the other compartments.  She has long history of pain in the knee that is worse with weather changes.  Her walking endurance is about 10 minutes due to her knee pain.  She localizes most of her pain to the medial aspect of the knee.  She also has a long history of back pain for which she has received ESI's in the past.  No history of spine surgery but she has seen Dr. Vertell Limber before.  Takes hydrocodone for pain control.  She has occasional groin pain but nothing consistent.  No history of previous knee surgery.  No history of diabetes.  She usually uses a walker or cane for getting around.  She would like to try a rollator..                ROS: All systems reviewed are negative as they relate to the chief complaint within the history of present illness.  Patient denies fevers or chills.  Assessment & Plan: Visit Diagnoses:  1. Unilateral primary osteoarthritis, right knee     Plan: Patient is a 87 year old female who presents for evaluation of right knee pain.  She has no history of previous knee surgeries.  MRI demonstrates mild to moderate degenerative changes, worse in the patellofemoral compartment.  She does have a 5 degree flexion contracture on exam today.  Discussed options available to patient, she would like to try cortisone  injection today.  Tolerated the injection well.  About 15 cc was aspirated for the joint prior to injection of the cortisone.  Plan to see how this does for her and we will preapproved her for right knee gel injection as well.  She will follow-up with the office when the gel injection is approved and we can see how much relief she got from the cortisone injection.  Patient agreed with plan.  Follow-Up Instructions: No follow-ups on file.   Orders:  No orders of the defined types were placed in this encounter.  No orders of the defined types were placed in this encounter.     Procedures: Large Joint Inj: R knee on 02/24/2022 6:42 PM Indications: diagnostic evaluation, joint swelling and pain Details: 18 G 1.5 in needle, superolateral approach  Arthrogram: No  Medications: 5 mL lidocaine 1 %; 40 mg methylPREDNISolone acetate 40 MG/ML; 4 mL bupivacaine 0.25 % Aspirate: 15 mL blood-tinged Outcome: tolerated well, no immediate complications Procedure, treatment alternatives, risks and benefits explained, specific risks discussed. Consent was given by the patient. Immediately prior to procedure a time out was called to verify the correct patient, procedure, equipment, support staff and site/side marked as required. Patient was prepped and draped in the usual sterile fashion.      Clinical Data: No additional findings.  Objective: Vital Signs:  LMP 12/18/1981   Physical Exam:  Constitutional: Patient appears well-developed HEENT:  Head: Normocephalic Eyes:EOM are normal Neck: Normal range of motion Cardiovascular: Normal rate Pulmonary/chest: Effort normal Neurologic: Patient is alert Skin: Skin is warm Psychiatric: Patient has normal mood and affect  Ortho Exam: Ortho exam demonstrates right knee with 5 degree flexion contracture.  115 degrees knee flexion.  She has small effusion noted.  Tenderness over the medial and lateral joint lines, primarily over the medial joint line.   Able to perform straight leg raise.  No pain with hip range of motion.  Patellofemoral crepitus noted with passive motion and active motion of the knee.  Specialty Comments:  No specialty comments available.  Imaging: No results found.   PMFS History: Patient Active Problem List   Diagnosis Date Noted   DDD (degenerative disc disease), lumbar 01/10/2022   Lumbar radicular pain (Bilateral) 01/10/2022   Lumbar foraminal stenosis (Right: L3-4, L4-5, L5-S1) (Left: L2-3) 01/10/2022   Cervicalgia 08/31/2021   Environmental allergies 08/06/2021   Osteoarthritis of AC (acromioclavicular) joint (Right) 06/07/2021   Osteoarthritis of shoulder (Right) 06/07/2021   Unspecified injury of muscle(s) and tendon(s) of the rotator cuff of shoulder, sequela (Right) 06/07/2021   Chronic use of opiate for therapeutic purpose 05/18/2021   Aortic atherosclerosis ([place]) 05/08/2021   Healthcare maintenance 05/08/2021   Osteoarthritis of knee (Right) 04/19/2021   Trigger point of shoulder region (Right) 04/19/2021   Chronic knee pain (Right) 03/15/2021   Lumbar facet hypertrophy (Multilevel) (Bilateral) 11/18/2020   Palpitations 08/26/2020   Osteoporosis 07/27/2020   Increased heart rate 06/12/2020   Hyperglycemia 06/11/2020   Osteoarthritis involving multiple joints 03/18/2020   Other spondylosis, sacral and sacrococcygeal region 03/18/2020   Acute cystitis 03/05/2020   Abnormal MRI, lumbar spine (12/30/2019) 01/08/2020   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   SOB (shortness of breath) 10/17/2019   Left ear pain 04/12/2019   Chronic hip pain (Right) 02/13/2019   Osteoarthritis of hip (Right) 02/13/2019   Loss of memory 02/10/2019   Facial pain 02/10/2019   Chronic musculoskeletal pain 01/08/2019   Chronic upper extremity pain (Right) 10/17/2018   Chronic thoracic back pain (Bilateral) (L>R) 10/17/2018   Trigger point of thoracic region (Bilateral) (L>R) 10/17/2018   Other specified  dorsopathies, sacral and sacrococcygeal region 10/17/2018   Sacroiliac joint dysfunction (Right) 10/17/2018   Osteoarthritis of sacroiliac joint (Right) 10/17/2018   Somatic dysfunction of sacroiliac joint (Right) 10/17/2018   Pharmacologic therapy 07/17/2018   Disorder of skeletal system 07/17/2018   Problems influencing health status 07/17/2018   Hyponatremia 07/09/2018   Pneumonia 07/09/2018   Fall 06/30/2018   GERD (gastroesophageal reflux disease) 06/30/2018   Opioid-induced constipation (OIC) 06/30/2018   DDD (degenerative disc disease), lumbosacral 05/09/2018   Spondylosis without myelopathy or radiculopathy, lumbosacral region 02/12/2018   Trigger point with back pain (Right) 02/12/2018   Trochanteric bursitis of hip (Bilateral) 10/22/2017   Osteopenia 10/09/2017   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, L4-5, L5-S1) (Right) 10/03/2017   Lumbar spinal stenosis (with neurogenic claudication) (L3-4) 10/03/2017   Chronic lower extremity pain (2ry area of Pain) (Right) 10/03/2017   Chronic lumbar radicular pain (S1) (Right) 10/03/2017   Chronic sacroiliac joint pain (Right) 09/10/2017   Chronic sacroiliac joint pain (Left) 07/23/2017   Chronic pain syndrome 01/16/2017   Cervical facet hypertrophy 06/12/2016   Cervical facet syndrome ([place]) 06/12/2016   Chronic shoulder pain (Right) 06/12/2016   Chronic constipation 05/26/2016   Malignant neoplasm of right female breast ([place])  03/28/2016   Lumbar facet syndrome (Bilateral) (R>L) 03/27/2016   Vaginitis 02/13/2016   Chronic upper back pain (Right) 12/30/2015   Myofascial pain syndrome (Right) (cervicothoracic) 12/30/2015   Long term current use of opiate analgesic 12/29/2015   Long term prescription opiate use 12/29/2015   Opiate use 12/29/2015   Encounter for therapeutic drug level monitoring 12/29/2015   Encounter for chronic pain management 12/29/2015   Chronic low back pain (1ry area of Pain) (Bilateral) (R>L) w/o sciatica  12/29/2015   Lumbar spondylosis 12/29/2015   Chronic hip pain (Bilateral) 12/29/2015   Chronic neck pain 12/29/2015   Cervical spondylosis 12/29/2015   Chronic cervical radicular pain (Right) 12/29/2015   Diffuse myofascial pain syndrome 12/29/2015   Neurogenic pain 12/29/2015   Fatigue 09/04/2015   Parkinsons ([place]) 06/07/2015   Sinusitis 02/07/2015   Dysuria 11/12/2014   Headache 10/18/2014   Obesity 06/22/2014   Mild depression 06/22/2014   Parkinson's disease ([place]) 06/22/2014   HLD (hyperlipidemia) 03/26/2014   Pure hypercholesterolemia 03/26/2014   Toenail fungus 03/08/2014   CKD (chronic kidney disease) stage 3, GFR 30-59 ml/min (HCC) 08/19/2013   Incomplete bladder emptying 08/19/2013   Mixed incontinence 08/19/2013   Bladder infection, chronic 08/19/2013   Hernia, rectovaginal 08/19/2013   Tremor 08/03/2013   Frequent UTI 02/22/2013   Primary cancer of upper inner quadrant of right female breast ([place]) 02/22/2013   Anemia 02/22/2013   Hypercholesterolemia 04/29/2010   Essential hypertension 04/29/2010   Past Medical History:  Diagnosis Date   Acute postoperative pain 10/25/2017   Anemia    Arm pain 07/26/2015   Arthritis    Arthritis, degenerative 03/26/2014   Back pain 11/01/2013   Bladder infection 06/2018   Breast cancer ([place])    Masectomy - left - 1986    Breast cancer [facility])    Mastectomy-right -2014   CHEST PAIN 04/29/2010   Qualifier: Diagnosis of  By: Wynetta Emery RN, Erika     Chronic cystitis    Cystocele 02/22/2013   Cystocele, midline 08/19/2013   Degeneration of intervertebral disc of lumbosacral region 03/26/2014   DYSPNEA 04/29/2010   Qualifier: Diagnosis of  By: Wynetta Emery RN, Erika     Enthesopathy of hip 03/26/2014   GERD (gastroesophageal reflux disease)    Hiatal hernia    HTN (hypertension)    Hypokalemia 06/2018   Hyponatremia 06/2018   LBP (low back pain) 03/26/2014   Neck pain 11/01/2013   Parkinson disease ([place])    Pneumonia 06/2018   Sinusitis  02/07/2015   Skin lesions 07/12/2014   Urinary incontinence    mixed     Family History  Problem Relation Age of Onset   Diabetes Father    Stroke Father    Colon polyps Father    Stroke Mother    Parkinson's disease Mother     Past Surgical History:  Procedure Laterality Date   ABDOMINAL HYSTERECTOMY     BREAST BIOPSY  2013   BREAST ENHANCEMENT SURGERY  1987   BREAST IMPLANT REMOVAL     BREAST IMPLANT REMOVAL Right 08/29/2012   BREAST SURGERY  1986   s/p left mastectomy   COLONOSCOPY WITH PROPOFOL N/A 09/13/2016   Procedure: COLONOSCOPY WITH PROPOFOL;  Surgeon: Manya Silvas, MD;  Location: [facility]COPY;  Service: Endoscopy;  Laterality: N/A;   MASTECTOMY  08/2012   right   TONSILLECTOMY AND ADENOIDECTOMY  36   VESICOVAGINAL FISTULA CLOSURE W/ TAH  1983   Social History   Occupational History  Not on file  Tobacco Use   Smoking status: Never   Smokeless tobacco: Never   Tobacco comments:    tobacco use - no  Vaping Use   Vaping Use: Never used  Substance and Sexual Activity   Alcohol use: No    Alcohol/week: 0.0 standard drinks   Drug use: No   Sexual activity: Never

## 2022-02-27 ENCOUNTER — Encounter: Payer: Self-pay | Admitting: Pain Medicine

## 2022-02-27 NOTE — Progress Notes (Unsigned)
Patient: Wanda Martinez  Service Category: E/M  Provider: Gaspar Cola, MD  DOB: [birth year]  DOS: 02/28/2022  Location: Office  MRN: [id number]  Setting: Ambulatory outpatient  Referring Provider: Einar Pheasant, MD  Type: Established Patient  Specialty: Interventional Pain Management  PCP: Einar Pheasant, MD  Location: Remote location  Delivery: TeleHealth     Virtual Encounter - Pain Management PROVIDER NOTE: Information contained herein reflects review and annotations entered in association with encounter. Interpretation of such information and data should be left to medically-trained personnel. Information provided to patient can be located elsewhere in the medical record under "Patient Instructions". Document created using STT-dictation technology, any transcriptional errors that may result from process are unintentional.    Contact & Pharmacy Preferred: [phone] Home: [phone] (home) Mobile: [phone] (mobile) E-mail: No e-mail address on record  [place], [place] - [address] [place] [address] Phone: [phone] Fax: [phone]   Pre-screening  Wanda Martinez offered "in-person" vs "virtual" encounter. She indicated preferring virtual for this encounter.   Reason COVID-19*   Social distancing based on CDC and AMA recommendations.   I contacted Wanda Martinez on 02/28/2022 via telephone.      I clearly identified myself as Gaspar Cola, MD. I verified that I was speaking with the correct person using two identifiers (Name: Wanda Martinez, and date of birth: August 01, 1935).  Consent I sought verbal advanced consent from Wanda Martinez for virtual visit interactions. I informed Wanda Martinez of possible security and privacy concerns, risks, and limitations associated with providing "not-in-person" medical evaluation and management services. I also informed Wanda Martinez of the availability of  "in-person" appointments. Finally, I informed her that there would be a charge for the virtual visit and that she could be  personally, fully or partially, financially responsible for it. Wanda Martinez expressed understanding and agreed to proceed.   Historic Elements   Wanda Martinez is a 87 y.o. year old, female patient evaluated today after our last contact on 02/14/2022. Wanda Martinez  has a past medical history of Acute postoperative pain (10/25/2017), Anemia, Arm pain (07/26/2015), Arthritis, Arthritis, degenerative (03/26/2014), Back pain (11/01/2013), Bladder infection (06/2018), Breast cancer ([place]), Breast cancer ([place]), CHEST PAIN (04/29/2010), Chronic cystitis, Cystocele (02/22/2013), Cystocele, midline (08/19/2013), Degeneration of intervertebral disc of lumbosacral region (03/26/2014), DYSPNEA (04/29/2010), Enthesopathy of hip (03/26/2014), GERD (gastroesophageal reflux disease), Hiatal hernia, HTN (hypertension), Hypokalemia (06/2018), Hyponatremia (06/2018), LBP (low back pain) (03/26/2014), Neck pain (11/01/2013), Parkinson disease ([place]), Pneumonia (06/2018), Sinusitis (02/07/2015), Skin lesions (07/12/2014), and Urinary incontinence. She also  has a past surgical history that includes Tonsillectomy and adenoidectomy (81); Vesicovaginal fistula closure w/ TAH (1983); Breast surgery (1986); Breast enhancement surgery (1987); Breast implant removal; Breast implant removal (Right, 08/29/2012); Mastectomy (08/2012); Abdominal hysterectomy; Colonoscopy with propofol (N/A, 09/13/2016); and Breast biopsy (2013). Wanda Martinez has a current medication list which includes the following prescription(s): amlodipine, aspirin, calcium carbonate, citalopram, cyclobenzaprine, donepezil, gabapentin, glucosamine sulfate, glucosamine-chondroitin, hydrochlorothiazide, hydrocodone-acetaminophen, letrozole, losartan, lubiprostone, magnesium oxide, magnesium oxide, melatonin, meloxicam, metoprolol succinate, multivitamin, nitrofurantoin  (macrocrystal-monohydrate), omega-3, omeprazole, pantoprazole, rosuvastatin, hydrocodone-acetaminophen, and hydrocodone-acetaminophen. She  reports that she has never smoked. She has never used smokeless tobacco. She reports that she does not drink alcohol and does not use drugs. Wanda Martinez is allergic to sulfa antibiotics and vesicare [solifenacin].   HPI  Today, she is being contacted for  ***   Pharmacotherapy Assessment   Opioid Analgesic: Hydrocodone/APAP 5/325, 1 tab  PO q 8 hrs (15 mg/day of hydrocodone) MME/day: 15 mg/day.   Monitoring: [place] PMP: PDMP reviewed during this encounter.       Pharmacotherapy: No side-effects or adverse reactions reported. Compliance: No problems identified. Effectiveness: Clinically acceptable. Plan: Refer to "POC". UDS:  Summary  Date Value Ref Range Status  06/01/2021 Note  Final    Comment:    ==================================================================== [employer] Select 13 (MW) ==================================================================== Test                             Result       Flag       Units  Drug Present and Declared for Prescription Verification   Hydrocodone                    255          EXPECTED   ng/mg creat   Dihydrocodeine                 113          EXPECTED   ng/mg creat   Norhydrocodone                 1235         EXPECTED   ng/mg creat    Sources of hydrocodone include scheduled prescription medications.    Dihydrocodeine and norhydrocodone are expected metabolites of    hydrocodone. Dihydrocodeine is also available as a scheduled    prescription medication.  ==================================================================== Test                      Result    Flag   Units      Ref Range   Creatinine              97               mg/dL      >=20 ==================================================================== Declared Medications:  The flagging and interpretation on this report are based on the   following declared medications.  Unexpected results may arise from  inaccuracies in the declared medications.   **Note: The testing scope of this panel includes these medications:   Hydrocodone (Norco)   **Note: The testing scope of this panel does not include the  following reported medications:   Acetaminophen (Norco)  Amlodipine (Norvasc)  Aspirin  Calcium  Citalopram (Celexa)  Cyclobenzaprine (Flexeril)  Donepezil (Aricept)  Gabapentin (Neurontin)  Glucosamine  Hydrochlorothiazide (Hydrodiuril)  Letrozole (Femara)  Losartan (Cozaar)  Lubiprostone (Amitiza)  Magnesium (Mag-Ox)  Melatonin  Meloxicam (Mobic)  Metoprolol (Toprol)  Multivitamin  Omeprazole (Prilosec)  Pantoprazole (Protonix)  Pravastatin (Pravachol) ==================================================================== For clinical consultation, please call [phone]. ====================================================================      Laboratory Chemistry Profile   Renal Lab Results  Component Value Date   BUN 20 02/06/2022   CREATININE 1.28 (H) 02/06/2022   BCR 20 06/11/2020   GFR 48.95 (L) 11/07/2021   GFRAA >60 07/27/2020   GFRNONAA 41 (L) 02/06/2022    Hepatic Lab Results  Component Value Date   AST 22 11/07/2021   ALT 15 11/07/2021   ALBUMIN 4.3 11/07/2021   ALKPHOS 37 (L) 11/07/2021    Electrolytes Lab Results  Component Value Date   NA 134 (L) 02/06/2022   K 4.0 02/06/2022   CL 99 02/06/2022   CALCIUM 9.3 02/06/2022   MG 2.2 07/17/2018    Bone Lab Results  Component Value  Date   25OHVITD1 42 07/17/2018   25OHVITD2 <1.0 07/17/2018   25OHVITD3 42 07/17/2018    Inflammation (CRP: Acute Phase) (ESR: Chronic Phase) Lab Results  Component Value Date   CRP 3 07/17/2018   ESRSEDRATE 27 07/17/2018         Note: Above Lab results reviewed.  Imaging  DG PAIN CLINIC C-ARM 1-60 MIN NO REPORT Fluoro was used, but no Radiologist interpretation will be provided.   Please refer to "NOTES" tab for provider progress note.  Assessment  There were no encounter diagnoses.  Plan of Care  Problem-specific:  No problem-specific Assessment & Plan notes found for this encounter.  Wanda Martinez has a current medication list which includes the following long-term medication(s): amlodipine, calcium carbonate, citalopram, cyclobenzaprine, donepezil, gabapentin, hydrochlorothiazide, hydrocodone-acetaminophen, losartan, magnesium oxide, magnesium oxide, meloxicam, metoprolol succinate, omeprazole, pantoprazole, rosuvastatin, hydrocodone-acetaminophen, and hydrocodone-acetaminophen.  Pharmacotherapy (Medications Ordered): No orders of the defined types were placed in this encounter.  Orders:  No orders of the defined types were placed in this encounter.  Follow-up plan:   No follow-ups on file.     Interventional Therapies  Risk   Complexity Considerations:   Estimated body mass index is 30.73 kg/m as calculated from the following:   Height as of this encounter: 5' 4" (1.626 m).   Weight as of this encounter: 179 lb (81.2 kg). WNL   Planned   Pending:   Diagnostic/therapeutic right L5 TFESI #4    Under consideration:   Diagnostic right genicular NB  Diagnostic bilateral cervical facet MBB    Completed:   Therapeutic right superior/lateral trapezius muscle TPI x1 (04/19/2021)  Therapeutic right thoracic back TPI x2 (10/17/2018)  (DKFUA)  Therapeutic left thoracic back TPI x1 (10/17/2018)  (DKFUA)  Therapeutic right quadratus lumborum and erector spinae muscle TPI x1 (02/12/2018)  Therapeutic bilateral lumbar facet MBB x5 (08/09/2021) (DKFUA)  Therapeutic right lumbar facet MBB x4 (12/21/2020) (DKFUA)  Therapeutic right lumbar facet RFA x2 (06/04/2018) (100/90/75/75-100)  Therapeutic left lumbar facet RFA x1 (02/12/2018) (100/60/90/>75)  Diagnostic right SI joint Blk x3 (12/21/2020)  (DKFUA)  Diagnostic left SI joint Blk x1 (08/09/2017)  (100/90/20/<25)  Therapeutic right SI joint RFA x3 (06/05/2019) (100/100/85/>50)  Therapeutic midline L2-3 LESI x1 (10/04/2017) (100/100/85/>50)  Therapeutic right L3-4 LESI x2 (11/04/2020) (100/100/20/<50)  Therapeutic right L4-5 LESI x1 (05/20/2020) (100/100/80/>75)  Therapeutic right L5-S1 LESI x2 (12/04/2019) (100/100/50/75)  Therapeutic right L5 TFESI x3 (12/04/2019) (100/100/50/75)  Therapeutic right S1 TFESI x1 (11/04/2020) (100/100/20/<50)  Diagnostic bilateral superficial trochanteric bursa injec. x1 (04/30/2018) (DKFUA)  Diagnostic right IA hip injection x1 (02/13/2019) (DKFUA)  Diagnostic/therapeutic right (steroid) knee injection x1 (04/19/2021) (100/100/50/50)  Therapeutic right ([place]) knee injection x1 (10/06/2021)  (DKFUA)  Therapeutic right ([place]ov[place]) knee injection x1 (06/07/2021) (100/100/100/75)    Therapeutic   Palliative (PRN) options:   Therapeutic/palliative lumbar facet RFA  Therapeutic/palliative lumbar facet MBB  Therapeutic left L2 TFESI (this level is responsible for her low back pain and left lower extremity pain.)      Recent Visits Date Type Provider Dept  02/14/22 Procedure visit Milinda Pointer, MD [facility]  01/26/22 Office Visit Milinda Pointer, MD [facility]  01/10/22 Procedure visit Milinda Pointer, MD [facility]  12/26/21 Office Visit Milinda Pointer, MD [facility]  Showing recent visits within past 90 days and meeting all other requirements Future Appointments Date Type Provider Dept  02/28/22 Office Visit Milinda Pointer, MD [facility]  03/20/22 Appointment Milinda Pointer, MD [facility]  Showing future appointments within next 90 days and meeting all other requirements  I discussed the assessment and treatment plan with the patient. The patient was provided an opportunity to ask questions and all were answered. The patient agreed with the plan and demonstrated  an understanding of the instructions.  Patient advised to call back or seek an in-person evaluation if the symptoms or condition worsens.  Duration of encounter: *** minutes.  Note by: Gaspar Cola, MD Date: 02/28/2022; Time: 3:44 PM

## 2022-02-28 ENCOUNTER — Other Ambulatory Visit: Payer: Self-pay

## 2022-02-28 ENCOUNTER — Ambulatory Visit: Payer: Medicare Other | Attending: Pain Medicine | Admitting: Pain Medicine

## 2022-02-28 DIAGNOSIS — M79604 Pain in right leg: Secondary | ICD-10-CM | POA: Diagnosis not present

## 2022-02-28 DIAGNOSIS — M5137 Other intervertebral disc degeneration, lumbosacral region: Secondary | ICD-10-CM

## 2022-02-28 DIAGNOSIS — M5410 Radiculopathy, site unspecified: Secondary | ICD-10-CM | POA: Diagnosis not present

## 2022-02-28 DIAGNOSIS — M545 Low back pain, unspecified: Secondary | ICD-10-CM | POA: Diagnosis not present

## 2022-02-28 DIAGNOSIS — R937 Abnormal findings on diagnostic imaging of other parts of musculoskeletal system: Secondary | ICD-10-CM | POA: Diagnosis not present

## 2022-02-28 DIAGNOSIS — G8929 Other chronic pain: Secondary | ICD-10-CM

## 2022-03-03 ENCOUNTER — Telehealth: Payer: Self-pay

## 2022-03-03 NOTE — Telephone Encounter (Signed)
Noted  

## 2022-03-03 NOTE — Telephone Encounter (Signed)
-----   Message from Melton Alar, [place] sent at 02/24/2022  2:10 PM EST ----- ?Wanda Martinez would like to get patient approved for gel injection in her right knee ? ?

## 2022-03-07 ENCOUNTER — Encounter: Payer: Self-pay | Admitting: Internal Medicine

## 2022-03-07 ENCOUNTER — Other Ambulatory Visit: Payer: Self-pay | Admitting: Internal Medicine

## 2022-03-07 ENCOUNTER — Ambulatory Visit (INDEPENDENT_AMBULATORY_CARE_PROVIDER_SITE_OTHER): Payer: Medicare Other | Admitting: Internal Medicine

## 2022-03-07 ENCOUNTER — Other Ambulatory Visit: Payer: Self-pay

## 2022-03-07 VITALS — BP 122/70 | HR 77 | Temp 98.2°F | Ht 64.0 in | Wt 184.2 lb

## 2022-03-07 DIAGNOSIS — Z853 Personal history of malignant neoplasm of breast: Secondary | ICD-10-CM | POA: Diagnosis not present

## 2022-03-07 DIAGNOSIS — N1831 Chronic kidney disease, stage 3a: Secondary | ICD-10-CM

## 2022-03-07 DIAGNOSIS — M1711 Unilateral primary osteoarthritis, right knee: Secondary | ICD-10-CM | POA: Diagnosis not present

## 2022-03-07 DIAGNOSIS — L989 Disorder of the skin and subcutaneous tissue, unspecified: Secondary | ICD-10-CM

## 2022-03-07 DIAGNOSIS — K219 Gastro-esophageal reflux disease without esophagitis: Secondary | ICD-10-CM | POA: Diagnosis not present

## 2022-03-07 DIAGNOSIS — E875 Hyperkalemia: Secondary | ICD-10-CM | POA: Diagnosis not present

## 2022-03-07 DIAGNOSIS — M810 Age-related osteoporosis without current pathological fracture: Secondary | ICD-10-CM

## 2022-03-07 DIAGNOSIS — D649 Anemia, unspecified: Secondary | ICD-10-CM | POA: Diagnosis not present

## 2022-03-07 DIAGNOSIS — G20 Parkinson's disease: Secondary | ICD-10-CM

## 2022-03-07 DIAGNOSIS — I700 Atherosclerosis of aorta: Secondary | ICD-10-CM

## 2022-03-07 DIAGNOSIS — Z23 Encounter for immunization: Secondary | ICD-10-CM | POA: Diagnosis not present

## 2022-03-07 DIAGNOSIS — E7800 Pure hypercholesterolemia, unspecified: Secondary | ICD-10-CM | POA: Diagnosis not present

## 2022-03-07 DIAGNOSIS — F320 Major depressive disorder, single episode, mild: Secondary | ICD-10-CM

## 2022-03-07 DIAGNOSIS — I10 Essential (primary) hypertension: Secondary | ICD-10-CM | POA: Diagnosis not present

## 2022-03-07 DIAGNOSIS — M7918 Myalgia, other site: Secondary | ICD-10-CM

## 2022-03-07 DIAGNOSIS — R739 Hyperglycemia, unspecified: Secondary | ICD-10-CM

## 2022-03-07 DIAGNOSIS — G20A1 Parkinson's disease without dyskinesia, without mention of fluctuations: Secondary | ICD-10-CM

## 2022-03-07 LAB — CBC WITH DIFFERENTIAL/PLATELET
Basophils Absolute: 0.1 10*3/uL (ref 0.0–0.1)
Basophils Relative: 1.2 % (ref 0.0–3.0)
Eosinophils Absolute: 0.4 10*3/uL (ref 0.0–0.7)
Eosinophils Relative: 6 % — ABNORMAL HIGH (ref 0.0–5.0)
HCT: 32.7 % — ABNORMAL LOW (ref 36.0–46.0)
Hemoglobin: 11 g/dL — ABNORMAL LOW (ref 12.0–15.0)
Lymphocytes Relative: 33.8 % (ref 12.0–46.0)
Lymphs Abs: 2.2 10*3/uL (ref 0.7–4.0)
MCHC: 33.6 g/dL (ref 30.0–36.0)
MCV: 87.9 fl (ref 78.0–100.0)
Monocytes Absolute: 0.6 10*3/uL (ref 0.1–1.0)
Monocytes Relative: 9.3 % (ref 3.0–12.0)
Neutro Abs: 3.3 10*3/uL (ref 1.4–7.7)
Neutrophils Relative %: 49.7 % (ref 43.0–77.0)
Platelets: 196 10*3/uL (ref 150.0–400.0)
RBC: 3.72 Mil/uL — ABNORMAL LOW (ref 3.87–5.11)
RDW: 14.3 % (ref 11.5–15.5)
WBC: 6.6 10*3/uL (ref 4.0–10.5)

## 2022-03-07 LAB — LIPID PANEL
Cholesterol: 120 mg/dL (ref 0–200)
HDL: 39.2 mg/dL (ref >39.00)
LDL Cholesterol: 45 mg/dL (ref 0–99)
NonHDL: 81.29
Total CHOL/HDL Ratio: 3
Triglycerides: 181 mg/dL — ABNORMAL HIGH (ref 0.0–149.0)
VLDL: 36.2 mg/dL (ref 0.0–40.0)

## 2022-03-07 LAB — BASIC METABOLIC PANEL
BUN: 29 mg/dL — ABNORMAL HIGH (ref 6–23)
CO2: 28 mEq/L (ref 19–32)
Calcium: 9.6 mg/dL (ref 8.4–10.5)
Chloride: 100 mEq/L (ref 96–112)
Creatinine, Ser: 1.8 mg/dL — ABNORMAL HIGH (ref 0.40–1.20)
GFR: 25.58 mL/min — ABNORMAL LOW (ref >60.00)
Glucose, Bld: 85 mg/dL (ref 70–99)
Potassium: 4.2 mEq/L (ref 3.5–5.1)
Sodium: 137 mEq/L (ref 135–145)

## 2022-03-07 LAB — HEPATIC FUNCTION PANEL
ALT: 18 U/L (ref 0–35)
AST: 27 U/L (ref 0–37)
Albumin: 4.3 g/dL (ref 3.5–5.2)
Alkaline Phosphatase: 45 U/L (ref 39–117)
Bilirubin, Direct: 0.2 mg/dL (ref 0.0–0.3)
Total Bilirubin: 0.4 mg/dL (ref 0.2–1.2)
Total Protein: 7.1 g/dL (ref 6.0–8.3)

## 2022-03-07 LAB — HEMOGLOBIN A1C: Hgb A1c MFr Bld: 6.2 % (ref 4.6–6.5)

## 2022-03-07 LAB — TSH: TSH: 2.99 u[IU]/mL (ref 0.35–5.50)

## 2022-03-07 NOTE — Assessment & Plan Note (Signed)
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  Patient completed 5 years of letrozole in May 2019.  No further interventions are needed.  Patient has had bilateral mastectomies, therefore no further mammograms are necessary.  ?

## 2022-03-07 NOTE — Progress Notes (Signed)
Patient ID: Wanda Martinez, female   DOB: 11-27-1935, 87 y.o.   MRN: [id number] ? ? ?Subjective:  ? ? Patient ID: Wanda Martinez, female    DOB: [birth year], 87 y.o.   MRN: [id number] ? ?This visit occurred during the SARS-CoV-2 public health emergency.  Safety protocols were in place, including screening questions prior to the visit, additional usage of staff PPE, and extensive cleaning of exam room while observing appropriate contact time as indicated for disinfecting solutions.  ? ?Patient here for a scheduled follow up.  ? ?Chief Complaint  ?Patient presents with  ? Follow-up  ?  88mofollow up for HTN  ? .  ? ?HPI ?Was recently evaluated 02/24/22 - ortho.  S/p aspiration and injection - right knee.  Is doing better.  Able to walk better.  No chest pain or sob reported.  No abdominal pain or bowel  change reported.  Request handicap placard - given limitations with walking.  Discussed covid vaccine.  Has a lesion - right face.  F/u with Dr GPhillip Heal  ? ? ?Past Medical History:  ?Diagnosis Date  ? Acute postoperative pain 10/25/2017  ? Anemia   ? Arm pain 07/26/2015  ? Arthritis   ? Arthritis, degenerative 03/26/2014  ? Back pain 11/01/2013  ? Bladder infection 06/2018  ? [place]   ? Masectomy - left - 1986   ? Breast cancer ([place]   ? Mastectomy-right -2014  ? CHEST PAIN 04/29/2010  ? Qualifier: Diagnosis of  By: JWynetta EmeryRN, EDoroteo Bradford   ? Chronic cystitis   ? Cystocele 02/22/2013  ? Cystocele, midline 08/19/2013  ? Degeneration of intervertebral disc of lumbosacral region 03/26/2014  ? DYSPNEA 04/29/2010  ? Qualifier: Diagnosis of  By: JWynetta EmeryRN, EDoroteo Bradford   ? Enthesopathy of hip 03/26/2014  ? GERD (gastroesophageal reflux disease)   ? Hiatal hernia   ? HTN (hypertension)   ? Hypokalemia 06/2018  ? Hyponatremia 06/2018  ? LBP (low back pain) 03/26/2014  ? Neck pain 11/01/2013  ? Parkinson disease ([place]   ? Pneumonia 06/2018  ? Sinusitis 02/07/2015  ? Skin lesions 07/12/2014  ? Urinary incontinence   ? mixed   ? ?Past  Surgical History:  ?Procedure Laterality Date  ? ABDOMINAL HYSTERECTOMY    ? BREAST BIOPSY  2013  ? BREAST ENHANCEMENT SURGERY  1987  ? BREAST IMPLANT REMOVAL    ? BREAST IMPLANT REMOVAL Right 08/29/2012  ? BREAST SURGERY  1986  ? s/p left mastectomy  ? COLONOSCOPY WITH PROPOFOL N/A 09/13/2016  ? Procedure: COLONOSCOPY WITH PROPOFOL;  Surgeon: RManya Silvas MD;  [facility];  Service: Endoscopy;  Laterality: N/A;  ? MASTECTOMY  08/2012  ? right  ? TONSILLECTOMY AND ADENOIDECTOMY  79  ? VESICOVAGINAL FISTULA CLOSURE W/ TAH  1983  ? ?Family History  ?Problem Relation Age of Onset  ? Diabetes Father   ? Stroke Father   ? Colon polyps Father   ? Stroke Mother   ? Parkinson's disease Mother   ? ?Social History  ? ?Socioeconomic History  ? Marital status: Married  ?  Spouse name: Not on file  ? Number of children: 3  ? Years of education: Not on file  ? Highest education level: Not on file  ?Occupational History  ? Not on file  ?Tobacco Use  ? Smoking status: Never  ? Smokeless tobacco: Never  ? Tobacco comments:  ?  tobacco use - no  ?Vaping Use  ?  Vaping Use: Never used  ?Substance and Sexual Activity  ? Alcohol use: No  ?  Alcohol/week: 0.0 standard drinks  ? Drug use: No  ? Sexual activity: Never  ?Other Topics Concern  ? Not on file  ?Social History Narrative  ? Not on file  ? ?Social Determinants of Health  ? ?Financial Resource Strain: Not on file  ?Food Insecurity: Not on file  ?Transportation Needs: Not on file  ?Physical Activity: Not on file  ?Stress: Not on file  ?Social Connections: Not on file  ? ? ? ?Review of Systems  ?Constitutional:  Negative for appetite change and unexpected weight change.  ?HENT:  Negative for congestion and sinus pressure.   ?Respiratory:  Negative for cough, chest tightness and shortness of breath.   ?Cardiovascular:  Negative for chest pain, palpitations and leg swelling.  ?Gastrointestinal:  Negative for abdominal pain, diarrhea, nausea and vomiting.  ?Genitourinary:   Negative for difficulty urinating and dysuria.  ?Musculoskeletal:  Negative for myalgias.  ?     Knee pain as outlined.   ?Skin:  Negative for color change and rash.  ?Neurological:  Negative for dizziness, light-headedness and headaches.  ?Psychiatric/Behavioral:  Negative for agitation and dysphoric mood.   ? ?   ?Objective:  ?  ? ?BP 122/70 (BP Location: Left Arm, Patient Position: Sitting, Cuff Size: Small)   Pulse 77   Temp 98.2 ?F (36.8 ?C) (Oral)   Ht '5\' 4"'$  (1.626 m)   Wt 184 lb 3.2 oz (83.6 kg)   LMP 12/18/1981   SpO2 96%   BMI 31.62 kg/m?  ?Wt Readings from Last 3 Encounters:  ?03/07/22 184 lb 3.2 oz (83.6 kg)  ?02/14/22 180 lb (81.6 kg)  ?02/06/22 182 lb 9.6 oz (82.8 kg)  ? ? ?Physical Exam ?Vitals reviewed.  ?Constitutional:   ?   General: She is not in acute distress. ?   Appearance: Normal appearance.  ?HENT:  ?   Head: Normocephalic and atraumatic.  ?   Right Ear: External ear normal.  ?   Left Ear: External ear normal.  ?Eyes:  ?   General: No scleral icterus.    ?   Right eye: No discharge.     ?   Left eye: No discharge.  ?   Conjunctiva/sclera: Conjunctivae normal.  ?Neck:  ?   Thyroid: No thyromegaly.  ?Cardiovascular:  ?   Rate and Rhythm: Normal rate and regular rhythm.  ?Pulmonary:  ?   Effort: No respiratory distress.  ?   Breath sounds: Normal breath sounds. No wheezing.  ?Abdominal:  ?   General: Bowel sounds are normal.  ?   Palpations: Abdomen is soft.  ?   Tenderness: There is no abdominal tenderness.  ?Musculoskeletal:     ?   General: No swelling or tenderness.  ?   Cervical back: Neck supple. No tenderness.  ?Lymphadenopathy:  ?   Cervical: No cervical adenopathy.  ?Skin: ?   Findings: No erythema or rash.  ?   Comments: Lesion - right face.    ?Neurological:  ?   Mental Status: She is alert.  ?Psychiatric:     ?   Mood and Affect: Mood normal.     ?   Behavior: Behavior normal.  ? ? ? ?Outpatient Encounter Medications as of 03/07/2022  ?Medication Sig  ? amLODipine (NORVASC)  2.5 MG tablet TAKE 1 TABLET BY MOUTH ONCE DAILY.  ? aspirin 81 MG EC tablet Take 81 mg by mouth daily as  needed.  ? calcium carbonate (OSCAL) 1500 (600 Ca) MG TABS tablet Take 1 tablet by mouth daily.  ? citalopram (CELEXA) 20 MG tablet TAKE 1 TABLET BY MOUTH ONCE DAILY.  ? cyclobenzaprine (FLEXERIL) 10 MG tablet TAKE ONE TABLET BY MOUTH AT BEDTIME.  ? donepezil (ARICEPT) 5 MG tablet   ? gabapentin (NEURONTIN) 300 MG capsule TAKE (2) CAPSULES BY MOUTH TWICE DAILY  ? GLUCOSAMINE SULFATE PO Take by mouth daily.   ? Glucosamine-Chondroitin 500-400 MG CAPS Take 2 capsules by mouth daily.  ? hydrochlorothiazide (HYDRODIURIL) 25 MG tablet TAKE 1 TABLET BY MOUTH ONCE DAILY. (PATIENT TAKES 1/2 ONCE DAILY AND EXTRA 1/2 IF NEEDED)  ? HYDROcodone-acetaminophen (NORCO/VICODIN) 5-325 MG tablet Take 1 tablet by mouth 3 (three) times daily. Must last 30 days.  ? letrozole (FEMARA) 2.5 MG tablet Take 2.5 mg by mouth daily.  ? losartan (COZAAR) 50 MG tablet TAKE (2) TABLETS BY MOUTH ONCE DAILY.  ? lubiprostone (AMITIZA) 24 MCG capsule TAKE 1 CAPSULE BY MOUTH ONCE DAILY WITH BREAKFAST.  ? magnesium oxide (MAG-OX) 400 (240 Mg) MG tablet TAKE 1 TABLET BY MOUTH ONCE DAILY.  ? magnesium oxide (MAG-OX) 400 (241.3 Mg) MG tablet TAKE 1 TABLET BY MOUTH ONCE DAILY.  ? melatonin 1 MG TABS tablet Take 3 mg by mouth at bedtime.  ? meloxicam (MOBIC) 7.5 MG tablet TAKE 1 TABLET BY MOUTH ONCE DAILY.  ? metoprolol succinate (TOPROL-XL) 25 MG 24 hr tablet TAKE 1 TABLET BY MOUTH TWICE DAILY  ? Multiple Vitamin (MULTIVITAMIN) tablet Take 1 tablet by mouth daily.  ? nitrofurantoin, macrocrystal-monohydrate, (MACROBID) 100 MG capsule Take 1 capsule (100 mg total) by mouth daily.  ? Omega-3 1000 MG CAPS Take by mouth.  ? omeprazole (PRILOSEC) 20 MG capsule TAKE 1 CAPSULE BY MOUTH TWICE DAILY FOR REFLUX.  ? pantoprazole (PROTONIX) 40 MG tablet TAKE 1 TABLET BY MOUTH ONCE DAILY.  ? rosuvastatin (CRESTOR) 20 MG tablet TAKE 1 TABLET BY MOUTH ONCE DAILY.  ?  [DISCONTINUED] cyclobenzaprine (FLEXERIL) 10 MG tablet TAKE ONE TABLET BY MOUTH AT BEDTIME.  ? [DISCONTINUED] gabapentin (NEURONTIN) 300 MG capsule TAKE (2) CAPSULES BY MOUTH TWICE DAILY  ? ?No facility-administere

## 2022-03-08 ENCOUNTER — Encounter: Payer: Self-pay | Admitting: Internal Medicine

## 2022-03-08 ENCOUNTER — Other Ambulatory Visit: Payer: Self-pay | Admitting: Internal Medicine

## 2022-03-08 DIAGNOSIS — N1831 Chronic kidney disease, stage 3a: Secondary | ICD-10-CM

## 2022-03-08 DIAGNOSIS — L989 Disorder of the skin and subcutaneous tissue, unspecified: Secondary | ICD-10-CM | POA: Insufficient documentation

## 2022-03-08 NOTE — Assessment & Plan Note (Signed)
Low carb diet and exercise.  Follow met b and a1c.  ?

## 2022-03-08 NOTE — Assessment & Plan Note (Signed)
prolia 07/2021.  Apparently insurance no longer covering.  Received zometa.   ?

## 2022-03-08 NOTE — Assessment & Plan Note (Signed)
Well controlled ?Continue current medications:  Hctz, metoprolol, amlodipine and losartan.  ?Follow metabolic panel ?

## 2022-03-08 NOTE — Assessment & Plan Note (Signed)
S/p recent aspiration and injection.  Helped.  Follow.  Is limited with walking.  Form completed for handicap placard.  ?

## 2022-03-08 NOTE — Assessment & Plan Note (Signed)
Continue citalopram.  Overall appears to be handling things well.  Follow.   

## 2022-03-08 NOTE — Assessment & Plan Note (Signed)
Follow cbc.  Followed by hematology.  Per hematology, no further w/up warranted.  If starts to decline, further w/up then.  Recheck today.  ?

## 2022-03-08 NOTE — Assessment & Plan Note (Signed)
On crestor.  Follow lipid panel and liver function tests.   

## 2022-03-08 NOTE — Assessment & Plan Note (Signed)
Persistent facial lesion.  appt Dr Phillip Heal.  ?

## 2022-03-08 NOTE — Progress Notes (Signed)
Order placed for f/u labs.  

## 2022-03-08 NOTE — Assessment & Plan Note (Signed)
Has been evaluated by neurology. 

## 2022-03-08 NOTE — Assessment & Plan Note (Addendum)
On crestor.   

## 2022-03-08 NOTE — Assessment & Plan Note (Signed)
No upper symptoms reported.  On protonix.   

## 2022-03-08 NOTE — Assessment & Plan Note (Signed)
Avoid antiinflammatories.  Stay hdrated.  Follow metabolic panel.   

## 2022-03-16 NOTE — Progress Notes (Signed)
PROVIDER NOTE: Information contained herein reflects review and annotations entered in association with encounter. Interpretation of such information and data should be left to medically-trained personnel. Information provided to patient can be located elsewhere in the medical record under "Patient Instructions". Document created using STT-dictation technology, any transcriptional errors that may result from process are unintentional.  ?  ?Patient: Wanda Martinez  Service Category: E/M  Provider: Gaspar Cola, MD  ?DOB: August 27, 1935  DOS: 03/20/2022  Specialty: Interventional Pain Management  ?MRN: [id number]  Setting: Ambulatory outpatient  PCP: Einar Pheasant, MD  ?Type: Established Patient    Referring Provider: Einar Pheasant, MD  ?Location: Office  Delivery: Face-to-face    ? ?HPI  ?Wanda Martinez, a 87 y.o. year old female, is here today because of her Chronic pain syndrome [G89.4]. Wanda Martinez primary complain today is Back Pain (lower) ?Last encounter: My last encounter with her was on 02/28/2022. ?Pertinent problems: Wanda Martinez has Headache; Parkinson's disease ([place]); Chronic low back pain (1ry area of Pain) (Bilateral) (R>L) w/o sciatica; Lumbar spondylosis; Chronic hip pain (Bilateral); Chronic neck pain; Cervical spondylosis; Chronic cervical radicular pain (Right); Diffuse myofascial pain syndrome; Neurogenic pain; Chronic upper back pain (Right); Myofascial pain syndrome (Right) (cervicothoracic); Lumbar facet syndrome (Bilateral) (R>L); History of breast cancer; Cervical facet hypertrophy; Cervical facet syndrome ([place]); Chronic shoulder pain (Right); Chronic pain syndrome; Chronic sacroiliac joint pain (Left); Chronic sacroiliac joint pain (Right); 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, L4-5, L5-S1) (Right); Lumbar spinal stenosis (with neurogenic claudication) (L3-4); Chronic lower extremity pain (2ry area of Pain) (Right); Chronic lumbar radicular pain (S1) (Right); Trochanteric  bursitis of hip (Bilateral); Spondylosis without myelopathy or radiculopathy, lumbosacral region; Trigger point with back pain (Right); DDD (degenerative disc disease), lumbosacral; Chronic upper extremity pain (Right); Chronic thoracic back pain (Bilateral) (L>R); Trigger point of thoracic region (Bilateral) (L>R); Other specified dorsopathies, sacral and sacrococcygeal region; Sacroiliac joint dysfunction (Right); Osteoarthritis of sacroiliac joint (Right); Somatic dysfunction of sacroiliac joint (Right); Chronic musculoskeletal pain; Facial pain; Chronic hip pain (Right); Osteoarthritis of hip (Right); Left ear pain; Abnormal MRI, lumbar spine (12/30/2019); Osteoarthritis involving multiple joints; Other spondylosis, sacral and sacrococcygeal region; Lumbar facet hypertrophy (Multilevel) (Bilateral); Chronic knee pain (Right); Osteoarthritis of knee (Right); Trigger point of shoulder region (Right); Osteoarthritis of AC (acromioclavicular) joint (Right); Osteoarthritis of shoulder (Right); Unspecified injury of muscle(s) and tendon(s) of the rotator cuff of shoulder, sequela (Right); Cervicalgia; DDD (degenerative disc disease), lumbar; Lumbar radicular pain (Bilateral); and Lumbar foraminal stenosis (Right: L3-4, L4-5, L5-S1) (Left: L2-3) on their pertinent problem list. ?Pain Assessment: Severity of Chronic pain is reported as a 7 /10. Location: Back Lower, Right/pain radiaties down her right leg to her foot. Onset: More than a month ago. Quality: Aching, Constant, Throbbing. Timing: Constant. Modifying factor(s): meds. ?Vitals:  height is '5\' 4"'$  (1.626 m) and weight is 180 lb (81.6 kg). Her temperature is 97.2 ?F (36.2 ?C) (abnormal). Her blood pressure is 138/75 and her pulse is 56 (abnormal). Her respiration is 14 and oxygen saturation is 98%.  ? ?Reason for encounter: medication management.   The patient indicates doing well with the current medication regimen. No adverse reactions or side effects reported  to the medications.  ? ?Today the patient came in expressing some interest for the possibility of a spinal cord stimulator.  Today I have spent some time with the patient explaining the process including the medical psychology evaluation, thoracic CT scan, and the trial.  She indicates that she wants to think about  it.  However, she is having increased pain in her right lower extremity and right lower back and she would like to come in for an interventional treatment.  The patient describes her right lower extremity pain as going all the way down into the lateral aspect of the foot.  Based on the pattern, this seems to be a combination of an L4 and S1 radicular pain.  I will be scheduling her to come in for an LESI.  The plan was shared with the patient who understood and accepted. ? ?RTCB: 06/26/2022 ?Nonopioids transfer 12/21/2020: Gabapentin ? ?Pharmacotherapy Assessment  ?Analgesic: Hydrocodone/APAP 5/325, 1 tab PO q 8 hrs (15 mg/day of hydrocodone) ?MME/day: 15 mg/day.  ? ?Monitoring: ?[place] PMP: PDMP reviewed during this encounter.       ?Pharmacotherapy: No side-effects or adverse reactions reported. ?Compliance: No problems identified. ?Effectiveness: Clinically acceptable. ? ?Chauncey Fischer, RN  03/20/2022 12:41 PM  Sign when Signing Visit ?Nursing Pain Medication Assessment:  ?Safety precautions to be maintained throughout the outpatient stay will include: orient to surroundings, keep bed in low position, maintain call bell within reach at all times, provide assistance with transfer out of bed and ambulation.  ?Medication Inspection Compliance: Pill count conducted under aseptic conditions, in front of the patient. Neither the pills nor the bottle was removed from the patient's sight at any time. Once count was completed pills were immediately returned to the patient in their original bottle. ? ?Medication: Hydrocodone/APAP ?Pill/Patch Count:  53 of 90 pills remain ?Pill/Patch Appearance: Markings consistent with  prescribed medication ?Bottle Appearance: Standard pharmacy container. Clearly labeled. ?Filled Date: 3 / 90 / 2023 ?Last Medication intake:  TodaySafety precautions to be maintained throughout the outpatient stay will include: orient to surroundings, keep bed in low position, maintain call bell within reach at all times, provide assistance with transfer out of bed and ambulation.  ?   UDS:  ?Summary  ?Date Value Ref Range Status  ?06/01/2021 Note  Final  ?  Comment:  ?  ==================================================================== ?[employer] Select 13 (MW) ?==================================================================== ?Test                             Result       Flag       Units ? ?Drug Present and Declared for Prescription Verification ?  Hydrocodone                    255          EXPECTED   ng/mg creat ?  Dihydrocodeine                 113          EXPECTED   ng/mg creat ?  Norhydrocodone                 1235         EXPECTED   ng/mg creat ?   Sources of hydrocodone include scheduled prescription medications. ?   Dihydrocodeine and norhydrocodone are expected metabolites of ?   hydrocodone. Dihydrocodeine is also available as a scheduled ?   prescription medication. ? ?==================================================================== ?Test                      Result    Flag   Units      Ref Range ?  Creatinine  97               mg/dL      >=20 ?==================================================================== ?Declared Medications: ? The flagging and interpretation on this report are based on the ? following declared medications.  Unexpected results may arise from ? inaccuracies in the declared medications. ? ? **Note: The testing scope of this panel includes these medications: ? ? Hydrocodone (Norco) ? ? **Note: The testing scope of this panel does not include the ? following reported medications: ? ? Acetaminophen (Norco) ? Amlodipine (Norvasc) ? Aspirin ? Calcium ? Citalopram  (Celexa) ? Cyclobenzaprine (Flexeril) ? Donepezil (Aricept) ? Gabapentin (Neurontin) ? Glucosamine ? Hydrochlorothiazide (Hydrodiuril) ? Letrozole (Femara) ? Losartan (Cozaar) ? Lubiprostone (Amitiza) ?

## 2022-03-17 ENCOUNTER — Other Ambulatory Visit (INDEPENDENT_AMBULATORY_CARE_PROVIDER_SITE_OTHER): Payer: Medicare Other

## 2022-03-17 DIAGNOSIS — N1831 Chronic kidney disease, stage 3a: Secondary | ICD-10-CM | POA: Diagnosis not present

## 2022-03-17 LAB — BASIC METABOLIC PANEL
BUN: 24 mg/dL — ABNORMAL HIGH (ref 6–23)
CO2: 30 mEq/L (ref 19–32)
Calcium: 9.5 mg/dL (ref 8.4–10.5)
Chloride: 99 mEq/L (ref 96–112)
Creatinine, Ser: 1.6 mg/dL — ABNORMAL HIGH (ref 0.40–1.20)
GFR: 29.45 mL/min — ABNORMAL LOW (ref >60.00)
Glucose, Bld: 87 mg/dL (ref 70–99)
Potassium: 4 mEq/L (ref 3.5–5.1)
Sodium: 136 mEq/L (ref 135–145)

## 2022-03-17 LAB — URINALYSIS, ROUTINE W REFLEX MICROSCOPIC
Bilirubin Urine: NEGATIVE
Hgb urine dipstick: NEGATIVE
Ketones, ur: NEGATIVE
Nitrite: NEGATIVE
Specific Gravity, Urine: 1.01 (ref 1.000–1.030)
Total Protein, Urine: NEGATIVE
Urine Glucose: NEGATIVE
Urobilinogen, UA: 0.2 (ref 0.0–1.0)
pH: 6.5 (ref 5.0–8.0)

## 2022-03-20 ENCOUNTER — Encounter: Payer: Self-pay | Admitting: Pain Medicine

## 2022-03-20 ENCOUNTER — Ambulatory Visit: Payer: Medicare Other | Attending: Pain Medicine | Admitting: Pain Medicine

## 2022-03-20 VITALS — BP 138/75 | HR 56 | Temp 97.2°F | Resp 14 | Ht 64.0 in | Wt 180.0 lb

## 2022-03-20 DIAGNOSIS — G894 Chronic pain syndrome: Secondary | ICD-10-CM

## 2022-03-20 DIAGNOSIS — S46001S Unspecified injury of muscle(s) and tendon(s) of the rotator cuff of right shoulder, sequela: Secondary | ICD-10-CM | POA: Diagnosis not present

## 2022-03-20 DIAGNOSIS — G8929 Other chronic pain: Secondary | ICD-10-CM

## 2022-03-20 DIAGNOSIS — M5134 Other intervertebral disc degeneration, thoracic region: Secondary | ICD-10-CM

## 2022-03-20 DIAGNOSIS — Z79891 Long term (current) use of opiate analgesic: Secondary | ICD-10-CM | POA: Diagnosis not present

## 2022-03-20 DIAGNOSIS — M5410 Radiculopathy, site unspecified: Secondary | ICD-10-CM | POA: Insufficient documentation

## 2022-03-20 DIAGNOSIS — Z79899 Other long term (current) drug therapy: Secondary | ICD-10-CM | POA: Diagnosis not present

## 2022-03-20 DIAGNOSIS — Z01818 Encounter for other preprocedural examination: Secondary | ICD-10-CM | POA: Diagnosis not present

## 2022-03-20 DIAGNOSIS — R937 Abnormal findings on diagnostic imaging of other parts of musculoskeletal system: Secondary | ICD-10-CM

## 2022-03-20 DIAGNOSIS — M5416 Radiculopathy, lumbar region: Secondary | ICD-10-CM

## 2022-03-20 DIAGNOSIS — M47816 Spondylosis without myelopathy or radiculopathy, lumbar region: Secondary | ICD-10-CM | POA: Diagnosis not present

## 2022-03-20 DIAGNOSIS — M79604 Pain in right leg: Secondary | ICD-10-CM | POA: Diagnosis not present

## 2022-03-20 DIAGNOSIS — M5137 Other intervertebral disc degeneration, lumbosacral region: Secondary | ICD-10-CM | POA: Insufficient documentation

## 2022-03-20 DIAGNOSIS — M25511 Pain in right shoulder: Secondary | ICD-10-CM | POA: Diagnosis not present

## 2022-03-20 DIAGNOSIS — M48061 Spinal stenosis, lumbar region without neurogenic claudication: Secondary | ICD-10-CM

## 2022-03-20 DIAGNOSIS — M25561 Pain in right knee: Secondary | ICD-10-CM | POA: Diagnosis not present

## 2022-03-20 DIAGNOSIS — M48062 Spinal stenosis, lumbar region with neurogenic claudication: Secondary | ICD-10-CM

## 2022-03-20 DIAGNOSIS — M545 Low back pain, unspecified: Secondary | ICD-10-CM | POA: Diagnosis not present

## 2022-03-20 DIAGNOSIS — M4807 Spinal stenosis, lumbosacral region: Secondary | ICD-10-CM

## 2022-03-20 DIAGNOSIS — M51379 Other intervertebral disc degeneration, lumbosacral region without mention of lumbar back pain or lower extremity pain: Secondary | ICD-10-CM

## 2022-03-20 MED ORDER — HYDROCODONE-ACETAMINOPHEN 5-325 MG PO TABS: 1.0000 | ORAL_TABLET | Freq: Three times a day (TID) | ORAL | 0 refills | Status: DC

## 2022-03-20 NOTE — Progress Notes (Signed)
Nursing Pain Medication Assessment:  ?Safety precautions to be maintained throughout the outpatient stay will include: orient to surroundings, keep bed in low position, maintain call bell within reach at all times, provide assistance with transfer out of bed and ambulation.  ?Medication Inspection Compliance: Pill count conducted under aseptic conditions, in front of the patient. Neither the pills nor the bottle was removed from the patient's sight at any time. Once count was completed pills were immediately returned to the patient in their original bottle. ? ?Medication: Hydrocodone/APAP ?Pill/Patch Count:  53 of 90 pills remain ?Pill/Patch Appearance: Markings consistent with prescribed medication ?Bottle Appearance: Standard pharmacy container. Clearly labeled. ?Filled Date: 3 / 21 / 2023 ?Last Medication intake:  TodaySafety precautions to be maintained throughout the outpatient stay will include: orient to surroundings, keep bed in low position, maintain call bell within reach at all times, provide assistance with transfer out of bed and ambulation.  ?

## 2022-03-20 NOTE — Patient Instructions (Signed)
____________________________________________________________________________________________ ? ?Medication Rules ? ?Purpose: To inform patients, and their family members, of our rules and regulations. ? ?Applies to: All patients receiving prescriptions (written or electronic). ? ?Pharmacy of record: Pharmacy where electronic prescriptions will be sent. If written prescriptions are taken to a different pharmacy, please inform the nursing staff. The pharmacy listed in the electronic medical record should be the one where you would like electronic prescriptions to be sent. ? ?Electronic prescriptions: In compliance with the  Strengthen Opioid Misuse Prevention (STOP) Act of 2017 (Session Law 2017-74/H243), effective December 18, 2018, all controlled substances must be electronically prescribed. Calling prescriptions to the pharmacy will cease to exist. ? ?Prescription refills: Only during scheduled appointments. Applies to all prescriptions. ? ?NOTE: The following applies primarily to controlled substances (Opioid* Pain Medications).  ? ?Type of encounter (visit): For patients receiving controlled substances, face-to-face visits are required. (Not an option or up to the patient.) ? ?Patient's responsibilities: ?Pain Pills: Bring all pain pills to every appointment (except for procedure appointments). ?Pill Bottles: Bring pills in original pharmacy bottle. Always bring the newest bottle. Bring bottle, even if empty. ?Medication refills: You are responsible for knowing and keeping track of what medications you take and those you need refilled. ?The day before your appointment: write a list of all prescriptions that need to be refilled. ?The day of the appointment: give the list to the admitting nurse. Prescriptions will be written only during appointments. No prescriptions will be written on procedure days. ?If you forget a medication: it will not be "Called in", "Faxed", or "electronically sent". You will  need to get another appointment to get these prescribed. ?No early refills. Do not call asking to have your prescription filled early. ?Prescription Accuracy: You are responsible for carefully inspecting your prescriptions before leaving our office. Have the discharge nurse carefully go over each prescription with you, before taking them home. Make sure that your name is accurately spelled, that your address is correct. Check the name and dose of your medication to make sure it is accurate. Check the number of pills, and the written instructions to make sure they are clear and accurate. Make sure that you are given enough medication to last until your next medication refill appointment. ?Taking Medication: Take medication as prescribed. When it comes to controlled substances, taking less pills or less frequently than prescribed is permitted and encouraged. ?Never take more pills than instructed. ?Never take medication more frequently than prescribed.  ?Inform other Doctors: Always inform, all of your healthcare providers, of all the medications you take. ?Pain Medication from other Providers: You are not allowed to accept any additional pain medication from any other Doctor or Healthcare provider. There are two exceptions to this rule. (see below) In the event that you require additional pain medication, you are responsible for notifying us, as stated below. ?Cough Medicine: Often these contain an opioid, such as codeine or hydrocodone. Never accept or take cough medicine containing these opioids if you are already taking an opioid* medication. The combination may cause respiratory failure and death. ?Medication Agreement: You are responsible for carefully reading and following our Medication Agreement. This must be signed before receiving any prescriptions from our practice. Safely store a copy of your signed Agreement. Violations to the Agreement will result in no further prescriptions. (Additional copies of our  Medication Agreement are available upon request.) ?Laws, Rules, & Regulations: All patients are expected to follow all Federal and State Laws, Statutes, Rules, & Regulations. Ignorance of   the Laws does not constitute a valid excuse.  ?Illegal drugs and Controlled Substances: The use of illegal substances (including, but not limited to marijuana and its derivatives) and/or the illegal use of any controlled substances is strictly prohibited. Violation of this rule may result in the immediate and permanent discontinuation of any and all prescriptions being written by our practice. The use of any illegal substances is prohibited. ?Adopted CDC guidelines & recommendations: Target dosing levels will be at or below 60 MME/day. Use of benzodiazepines** is not recommended. ? ?Exceptions: There are only two exceptions to the rule of not receiving pain medications from other Healthcare Providers. ?Exception #1 (Emergencies): In the event of an emergency (i.e.: accident requiring emergency care), you are allowed to receive additional pain medication. However, you are responsible for: As soon as you are able, call our office [phone], at any time of the day or night, and leave a message stating your name, the date and nature of the emergency, and the name and dose of the medication prescribed. In the event that your call is answered by a member of our staff, make sure to document and save the date, time, and the name of the person that took your information.  ?Exception #2 (Planned Surgery): In the event that you are scheduled by another doctor or dentist to have any type of surgery or procedure, you are allowed (for a period no longer than 30 days), to receive additional pain medication, for the acute post-op pain. However, in this case, you are responsible for picking up a copy of our "Post-op Pain Management for Surgeons" handout, and giving it to your surgeon or dentist. This document is available at our office, and  does not require an appointment to obtain it. Simply go to our office during business hours (Monday-Thursday from 8:00 AM to 4:00 PM) (Friday 8:00 AM to 12:00 Noon) or if you have a scheduled appointment with us, prior to your surgery, and ask for it by name. In addition, you are responsible for: calling our office [phone], at any time of the day or night, and leaving a message stating your name, name of your surgeon, type of surgery, and date of procedure or surgery. Failure to comply with your responsibilities may result in termination of therapy involving the controlled substances. ?Medication Agreement Violation. Following the above rules, including your responsibilities will help you in avoiding a Medication Agreement Violation (?Breaking your Pain Medication Contract?). ? ?*Opioid medications include: morphine, codeine, oxycodone, oxymorphone, hydrocodone, hydromorphone, meperidine, tramadol, tapentadol, buprenorphine, fentanyl, methadone. ?**Benzodiazepine medications include: diazepam (Valium), alprazolam (Xanax), clonazepam (Klonopine), lorazepam (Ativan), clorazepate (Tranxene), chlordiazepoxide (Librium), estazolam (Prosom), oxazepam (Serax), temazepam (Restoril), triazolam (Halcion) ?(Last updated: 09/14/2021) ?____________________________________________________________________________________________ ? ____________________________________________________________________________________________ ? ?Medication Recommendations and Reminders ? ?Applies to: All patients receiving prescriptions (written and/or electronic). ? ?Medication Rules & Regulations: These rules and regulations exist for your safety and that of others. They are not flexible and neither are we. Dismissing or ignoring them will be considered "non-compliance" with medication therapy, resulting in complete and irreversible termination of such therapy. (See document titled "Medication Rules" for more details.) In all conscience,  because of safety reasons, we cannot continue providing a therapy where the patient does not follow instructions. ? ?Pharmacy of record:  ?Definition: This is the pharmacy where your electronic prescriptions w

## 2022-03-21 ENCOUNTER — Telehealth: Payer: Self-pay

## 2022-03-21 NOTE — Telephone Encounter (Signed)
LM for pt to cb re: results ?

## 2022-03-21 NOTE — Telephone Encounter (Signed)
-----   Message from Einar Pheasant, MD sent at 03/21/2022  5:32 AM EDT ----- ?Notify - kidney function has improved some from last check.  Still decreased.  See last result note.  Needs to avoid antiinflammatories.  Per med list, mobic is documented.  Is she still taking.  Needs to stop.  Also, document dose of hctz.  Per directions, takes 1/2 tablet per day, but "may take extra 1/2 prn".  Need to know if taking extra.  Would like to schedule a renal ultrasound to further evaluate kidneys.  If agreeable, I will place order.   ?

## 2022-03-22 ENCOUNTER — Other Ambulatory Visit: Payer: Self-pay | Admitting: Internal Medicine

## 2022-03-22 ENCOUNTER — Telehealth: Payer: Self-pay | Admitting: Internal Medicine

## 2022-03-22 DIAGNOSIS — R944 Abnormal results of kidney function studies: Secondary | ICD-10-CM

## 2022-03-22 NOTE — Progress Notes (Signed)
Order placed for renal ultrasound.  

## 2022-03-22 NOTE — Telephone Encounter (Signed)
Vm full on cell and just rings on home number. Thank you! ?

## 2022-03-29 NOTE — Progress Notes (Signed)
PROVIDER NOTE: Interpretation of information contained herein should be left to medically-trained personnel. Specific patient instructions are provided elsewhere under "Patient Instructions" section of medical record. This document was created in part using STT-dictation technology, any transcriptional errors that may result from this process are unintentional.  ?Patient: Wanda Martinez ?Type: Established ?DOB: Mar 15, 1935 ?MRN: [id number] ?PCP: Einar Pheasant, MD  Service: Procedure ?DOS: 03/30/2022 ?Setting: Ambulatory ?Location: Ambulatory outpatient facility ?Delivery: Face-to-face Provider: Gaspar Cola, MD ?Specialty: Interventional Pain Management ?Specialty designation: 09 ?Location: Outpatient facility ?Ref. Prov.: Einar Pheasant, MD   ? ?Primary Reason for Visit: Interventional Pain Management Treatment. ?CC: Back Pain (lower) ?  ?Procedure:          ? Type: Lumbar epidural steroid injection (LESI) (interlaminar) #2    ?Laterality: Right   ?Level:  L4-5 Level.  ?Imaging: Fluoroscopic guidance ?Anesthesia: Local anesthesia (1-2% Lidocaine) ?Anxiolysis: IV Versed         ?Sedation: None. ?DOS: 03/30/2022  ?Performed by: Gaspar Cola, MD ? ?Purpose: Diagnostic/Therapeutic ?Indications: Lumbar radicular pain of intraspinal etiology of more than 4 weeks that has failed to respond to conservative therapy and is severe enough to impact quality of life or function. ?1. Chronic lumbar radicular pain (S1) (Right)   ?2. Chronic low back pain (1ry area of Pain) (Bilateral) (R>L) w/o sciatica   ?3. Chronic lower extremity pain (2ry area of Pain) (Right)   ?4. DDD (degenerative disc disease), lumbosacral   ?5. Lumbar radicular pain (Bilateral)   ?6. 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 Lumbar spinal stenosis (with neurogenic claudication) (L3-4)   ?8. 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, L4-5, L5-S1) (Right)   ?9. Lumbar spondylosis   ? ?NAS-11 Pain score:   ? Pre-procedure: 9 /10  ? Post-procedure: 0-No pain/10  ? ?  ? ?Position / Prep / Materials:  ?Position: Prone w/ head of the table raised (slight reverse trendelenburg) to facilitate breathing.  ?Prep solution: DuraPrep (Iodine Povacrylex [0.7% available iodine] and Isopropyl Alcohol, 74% w/w) ?Prep Area: Entire Posterior Lumbar Region from lower scapular tip down to mid buttocks area and from flank to flank. ?Materials:  ?Tray: Epidural tray ?Needle(s):  ?Type: Epidural needle          ?Gauge (G):  17 ?Length: Regular (3.5-in) ?Qty: 1 ? ?Pre-op H&P Assessment:  ?Wanda Martinez is a 87 y.o. (year old), female patient, seen today for interventional treatment. She  has a past surgical history that includes Tonsillectomy and adenoidectomy (81); Vesicovaginal fistula closure w/ TAH (1983); Breast surgery (1986); Breast enhancement surgery (1987); Breast implant removal; Breast implant removal (Right, 08/29/2012); Mastectomy (08/2012); Abdominal hysterectomy; Colonoscopy with propofol (N/A, 09/13/2016); and Breast biopsy (2013). Ms. Sozio has a current medication list which includes the following prescription(s): amlodipine, aspirin, calcium carbonate, citalopram, cyclobenzaprine, donepezil, gabapentin, glucosamine sulfate, glucosamine-chondroitin, hydrochlorothiazide, hydrocodone-acetaminophen, [START ON 04/27/2022] hydrocodone-acetaminophen, [START ON 05/27/2022] hydrocodone-acetaminophen, letrozole, losartan, lubiprostone, magnesium oxide, magnesium oxide, melatonin, meloxicam, metoprolol succinate, multivitamin, nitrofurantoin (macrocrystal-monohydrate), omega-3, omeprazole, pantoprazole, and rosuvastatin, and the following Facility-Administered Medications: pentafluoroprop-tetrafluoroeth. Her primarily concern today is the Back Pain (lower) ? ?Initial Vital Signs:  ?Pulse/HCG Rate: (!) 53ECG Heart Rate: (!) 57 ?Temp: (!) 96.8 ?F (36 ?C) ?Resp: 11 ?BP: (!) 125/48 ?SpO2: 100 % ? ?BMI: Estimated body mass index is 31.89  kg/m? as calculated from the following: ?  Height as of this encounter: '5\' 3"'$  (1.6 m). ?  Weight as of this encounter: 180 lb (81.6 kg). ? ?Risk Assessment: ?Allergies: Reviewed. She  is allergic to sulfa antibiotics and vesicare [solifenacin].  ?Allergy Precautions: None required ?Coagulopathies: Reviewed. None identified.  ?Blood-thinner therapy: None at this time ?Active Infection(s): Reviewed. None identified. Ms. Hannan is afebrile ? ?Site Confirmation: Ms. Rynders was asked to confirm the procedure and laterality before marking the site ?Procedure checklist: Completed ?Consent: Before the procedure and under the influence of no sedative(s), amnesic(s), or anxiolytics, the patient was informed of the treatment options, risks and possible complications. To fulfill our ethical and legal obligations, as recommended by the American Medical Association's Code of Ethics, I have informed the patient of my clinical impression; the nature and purpose of the treatment or procedure; the risks, benefits, and possible complications of the intervention; the alternatives, including doing nothing; the risk(s) and benefit(s) of the alternative treatment(s) or procedure(s); and the risk(s) and benefit(s) of doing nothing. ?The patient was provided information about the general risks and possible complications associated with the procedure. These may include, but are not limited to: failure to achieve desired goals, infection, bleeding, organ or nerve damage, allergic reactions, paralysis, and death. ?In addition, the patient was informed of those risks and complications associated to Spine-related procedures, such as failure to decrease pain; infection (i.e.: Meningitis, epidural or intraspinal abscess); bleeding (i.e.: epidural hematoma, subarachnoid hemorrhage, or any other type of intraspinal or peri-dural bleeding); organ or nerve damage (i.e.: Any type of peripheral nerve, nerve root, or spinal cord injury) with subsequent  damage to sensory, motor, and/or autonomic systems, resulting in permanent pain, numbness, and/or weakness of one or several areas of the body; allergic reactions; (i.e.: anaphylactic reaction); and/or death. ?Furthermore, the patient was informed of those risks and complications associated with the medications. These include, but are not limited to: allergic reactions (i.e.: anaphylactic or anaphylactoid reaction(s)); adrenal axis suppression; blood sugar elevation that in diabetics may result in ketoacidosis or comma; water retention that in patients with history of congestive heart failure may result in shortness of breath, pulmonary edema, and decompensation with resultant heart failure; weight gain; swelling or edema; medication-induced neural toxicity; particulate matter embolism and blood vessel occlusion with resultant organ, and/or nervous system infarction; and/or aseptic necrosis of one or more joints. ?Finally, the patient was informed that Medicine is not an exact science; therefore, there is also the possibility of unforeseen or unpredictable risks and/or possible complications that may result in a catastrophic outcome. The patient indicated having understood very clearly. We have given the patient no guarantees and we have made no promises. Enough time was given to the patient to ask questions, all of which were answered to the patient's satisfaction. Ms. Duce has indicated that she wanted to continue with the procedure. ?Attestation: I, the ordering provider, attest that I have discussed with the patient the benefits, risks, side-effects, alternatives, likelihood of achieving goals, and potential problems during recovery for the procedure that I have provided informed consent. ?Date  Time: 03/30/2022 10:58 AM ? ?Pre-Procedure Preparation:  ?Monitoring: As per clinic protocol. Respiration, ETCO2, SpO2, BP, heart rate and rhythm monitor placed and checked for adequate function ?Safety Precautions:  Patient was assessed for positional comfort and pressure points before starting the procedure. ?Time-out: I initiated and conducted the "Time-out" before starting the procedure, as per protocol. The patient was asked to

## 2022-03-30 ENCOUNTER — Ambulatory Visit (HOSPITAL_BASED_OUTPATIENT_CLINIC_OR_DEPARTMENT_OTHER): Payer: Medicare Other | Admitting: Pain Medicine

## 2022-03-30 ENCOUNTER — Ambulatory Visit
Admission: RE | Admit: 2022-03-30 | Discharge: 2022-03-30 | Disposition: A | Discharge status: Home / Self Care | Payer: Medicare Other | Source: Ambulatory Visit | Attending: Pain Medicine | Admitting: Pain Medicine

## 2022-03-30 ENCOUNTER — Encounter: Payer: Self-pay | Admitting: Pain Medicine

## 2022-03-30 VITALS — BP 148/70 | HR 51 | Temp 96.8°F | Resp 11 | Ht 64.0 in | Wt 180.0 lb

## 2022-03-30 DIAGNOSIS — M545 Low back pain, unspecified: Secondary | ICD-10-CM | POA: Insufficient documentation

## 2022-03-30 DIAGNOSIS — M79604 Pain in right leg: Secondary | ICD-10-CM | POA: Insufficient documentation

## 2022-03-30 DIAGNOSIS — M4807 Spinal stenosis, lumbosacral region: Secondary | ICD-10-CM | POA: Diagnosis not present

## 2022-03-30 DIAGNOSIS — M48062 Spinal stenosis, lumbar region with neurogenic claudication: Secondary | ICD-10-CM | POA: Insufficient documentation

## 2022-03-30 DIAGNOSIS — M5137 Other intervertebral disc degeneration, lumbosacral region: Secondary | ICD-10-CM | POA: Diagnosis not present

## 2022-03-30 DIAGNOSIS — G8929 Other chronic pain: Secondary | ICD-10-CM | POA: Insufficient documentation

## 2022-03-30 DIAGNOSIS — M51379 Other intervertebral disc degeneration, lumbosacral region without mention of lumbar back pain or lower extremity pain: Secondary | ICD-10-CM

## 2022-03-30 DIAGNOSIS — M5416 Radiculopathy, lumbar region: Secondary | ICD-10-CM

## 2022-03-30 DIAGNOSIS — M47816 Spondylosis without myelopathy or radiculopathy, lumbar region: Secondary | ICD-10-CM

## 2022-03-30 DIAGNOSIS — M48061 Spinal stenosis, lumbar region without neurogenic claudication: Secondary | ICD-10-CM | POA: Insufficient documentation

## 2022-03-30 DIAGNOSIS — M5410 Radiculopathy, site unspecified: Secondary | ICD-10-CM | POA: Diagnosis not present

## 2022-03-30 MED ORDER — LIDOCAINE HCL 2 % IJ SOLN
20.0000 mL | Freq: Once | INTRAMUSCULAR | Status: AC
  Administered 2022-03-30: 400 mg

## 2022-03-30 MED ORDER — MIDAZOLAM HCL 5 MG/5ML IJ SOLN: 0.5000 mg | Freq: Once | INTRAMUSCULAR | Status: DC

## 2022-03-30 MED ORDER — PENTAFLUOROPROP-TETRAFLUOROETH EX AERO: INHALATION_SPRAY | Freq: Once | CUTANEOUS | Status: DC

## 2022-03-30 MED ORDER — TRIAMCINOLONE ACETONIDE 40 MG/ML IJ SUSP
40.0000 mg | Freq: Once | INTRAMUSCULAR | Status: AC
  Administered 2022-03-30: 40 mg

## 2022-03-30 MED ORDER — TRIAMCINOLONE ACETONIDE 40 MG/ML IJ SUSP
INTRAMUSCULAR | Status: AC
  Filled 2022-03-30: qty 1

## 2022-03-30 MED ORDER — SODIUM CHLORIDE (PF) 0.9 % IJ SOLN
INTRAMUSCULAR | Status: AC
  Filled 2022-03-30: qty 10

## 2022-03-30 MED ORDER — LIDOCAINE HCL 2 % IJ SOLN
INTRAMUSCULAR | Status: AC
  Filled 2022-03-30: qty 20

## 2022-03-30 MED ORDER — IOHEXOL 180 MG/ML  SOLN
10.0000 mL | Freq: Once | INTRAMUSCULAR | Status: AC
  Administered 2022-03-30: 5 mL via EPIDURAL

## 2022-03-30 MED ORDER — SODIUM CHLORIDE 0.9% FLUSH
2.0000 mL | Freq: Once | INTRAVENOUS | Status: AC
  Administered 2022-03-30: 2 mL

## 2022-03-30 MED ORDER — ROPIVACAINE HCL 2 MG/ML IJ SOLN
INTRAMUSCULAR | Status: AC
  Filled 2022-03-30: qty 20

## 2022-03-30 MED ORDER — ROPIVACAINE HCL 2 MG/ML IJ SOLN
2.0000 mL | Freq: Once | INTRAMUSCULAR | Status: AC
  Administered 2022-03-30: 2 mL via EPIDURAL

## 2022-03-30 NOTE — Progress Notes (Signed)
Safety precautions to be maintained throughout the outpatient stay will include: orient to surroundings, keep bed in low position, maintain call bell within reach at all times, provide assistance with transfer out of bed and ambulation.  

## 2022-03-30 NOTE — Patient Instructions (Signed)

## 2022-03-31 ENCOUNTER — Ambulatory Visit
Admission: RE | Admit: 2022-03-31 | Discharge: 2022-03-31 | Disposition: A | Discharge status: Home / Self Care | Payer: Medicare Other | Source: Ambulatory Visit | Attending: Internal Medicine | Admitting: Internal Medicine

## 2022-03-31 ENCOUNTER — Telehealth: Payer: Self-pay | Admitting: *Deleted

## 2022-03-31 DIAGNOSIS — R944 Abnormal results of kidney function studies: Secondary | ICD-10-CM | POA: Diagnosis not present

## 2022-03-31 NOTE — Telephone Encounter (Signed)
Post procedure call; patient states that she is doing well.  No questions or concerns.  ?

## 2022-04-06 ENCOUNTER — Other Ambulatory Visit: Payer: Self-pay | Admitting: Internal Medicine

## 2022-04-06 DIAGNOSIS — M7918 Myalgia, other site: Secondary | ICD-10-CM

## 2022-04-07 ENCOUNTER — Other Ambulatory Visit: Payer: Self-pay | Admitting: Internal Medicine

## 2022-04-16 NOTE — Progress Notes (Signed)
PROVIDER NOTE: Information contained herein reflects review and annotations entered in association with encounter. Interpretation of such information and data should be left to medically-trained personnel. Information provided to patient can be located elsewhere in the medical record under "Patient Instructions". Document created using STT-dictation technology, any transcriptional errors that may result from process are unintentional.  ?  ?Patient: Wanda Martinez  Service Category: E/M  Provider: Gaspar Cola, MD  ?DOB: [birth year]  DOS: 04/18/2022  Specialty: Interventional Pain Management  ?MRN: [id number]  Setting: Ambulatory outpatient  PCP: Einar Pheasant, MD  ?Type: Established Patient    Referring Provider: Einar Pheasant, MD  ?Location: Office  Delivery: Face-to-face    ? ?HPI  ?[name], a 87 y.o. year old female, is here today because of her Chronic radicular pain of lower extremity [M54.10, G89.29]. [name] primary complain today is Back Pain (Right, lower) ?Last encounter: My last encounter with her was on 03/30/2022. ?Pertinent problems: [name] has Headache; Parkinson's disease ([place]); Chronic low back pain (1ry area of Pain) (Bilateral) (R>L) w/o sciatica; Lumbar spondylosis; Chronic hip pain (Bilateral); Chronic neck pain; Cervical spondylosis; Chronic cervical radicular pain (Right); Diffuse myofascial pain syndrome; Neurogenic pain; Chronic upper back pain (Right); Myofascial pain syndrome (Right) (cervicothoracic); Lumbar facet syndrome (Bilateral) (R>L); History of breast cancer; Cervical facet hypertrophy; Cervical facet syndrome ([place]); Chronic shoulder pain (Right); Chronic pain syndrome; Chronic sacroiliac joint pain (Left); Chronic sacroiliac joint pain (Right); 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, L4-5, L5-S1) (Right); Lumbar spinal stenosis (with neurogenic claudication) (L3-4); Chronic lower extremity pain (2ry area of Pain) (Right); Chronic lumbar radicular  pain (S1) (Right); Trochanteric bursitis of hip (Bilateral); Spondylosis without myelopathy or radiculopathy, lumbosacral region; Trigger point with back pain (Right); DDD (degenerative disc disease), lumbosacral; Chronic upper extremity pain (Right); Chronic thoracic back pain (Bilateral) (L>R); Trigger point of thoracic region (Bilateral) (L>R); Other specified dorsopathies, sacral and sacrococcygeal region; Sacroiliac joint dysfunction (Right); Osteoarthritis of sacroiliac joint (Right); Somatic dysfunction of sacroiliac joint (Right); Chronic musculoskeletal pain; Facial pain; Chronic hip pain (Right); Osteoarthritis of hip (Right); Left ear pain; Abnormal MRI, lumbar spine (12/30/2019); Osteoarthritis involving multiple joints; Other spondylosis, sacral and sacrococcygeal region; Lumbar facet hypertrophy (Multilevel) (Bilateral); Chronic knee pain (Right); Osteoarthritis of knee (Right); Trigger point of shoulder region (Right); Osteoarthritis of AC (acromioclavicular) joint (Right); Osteoarthritis of shoulder (Right); Unspecified injury of muscle(s) and tendon(s) of the rotator cuff of shoulder, sequela (Right); Cervicalgia; DDD (degenerative disc disease), lumbar; Lumbar radicular pain (Bilateral); and Lumbar foraminal stenosis (Right: L3-4, L4-5, L5-S1) (Left: L2-3) on their pertinent problem list. ?Pain Assessment: Severity of Chronic pain is reported as a 5 /10. Location: Back Lower/right groin and right ankle. Onset: More than a month ago. Quality: Aching. Timing: Constant. Modifying factor(s): medications. ?Vitals:  height is '5\' 4"'$  (1.626 m) and weight is 180 lb (81.6 kg). Her temporal temperature is 97.3 ?F (36.3 ?C) (abnormal). Her blood pressure is 148/68 (abnormal) and her pulse is 58 (abnormal). Her respiration is 14 and oxygen saturation is 100%.  ? ?Reason for encounter: post-procedure evaluation and assessment.  The patient indicates having attained 100% relief of the pain for the duration of the  local anesthetic followed by an ongoing 75% improvement of her lumbar radicular pain.  Today she comes in indicating that she is having some new symptoms with pain in the groin area.  She does have an x-ray of the right hip done on 12/30/2019, which at that time showed no significant arthropathy. ? ?Post-procedure  evaluation  ? Type: Lumbar epidural steroid injection (LESI) (interlaminar) #2    ?Laterality: Right   ?Level:  L4-5 Level.  ?Imaging: Fluoroscopic guidance ?Anesthesia: Local anesthesia (1-2% Lidocaine) ?Anxiolysis: IV Versed         ?Sedation: None. ?DOS: 03/30/2022  ?Performed by: Gaspar Cola, MD ? ?Purpose: Diagnostic/Therapeutic ?Indications: Lumbar radicular pain of intraspinal etiology of more than 4 weeks that has failed to respond to conservative therapy and is severe enough to impact quality of life or function. ?1. Chronic lumbar radicular pain (S1) (Right)   ?2. Chronic low back pain (1ry area of Pain) (Bilateral) (R>L) w/o sciatica   ?3. Chronic lower extremity pain (2ry area of Pain) (Right)   ?4. DDD (degenerative disc disease), lumbosacral   ?5. Lumbar radicular pain (Bilateral)   ?6. 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 Lumbar spinal stenosis (with neurogenic claudication) (L3-4)   ?8. 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, L4-5, L5-S1) (Right)   ?9. Lumbar spondylosis   ? ?NAS-11 Pain score:  ? Pre-procedure: 9 /10  ? Post-procedure: 0-No pain/10  ? ?   ?Effectiveness:  ?Initial hour after procedure: 100 %. ?Subsequent 4-6 hours post-procedure: 90 %. ?Analgesia past initial 6 hours: 75 %. ?Ongoing improvement:  ?Analgesic: The patient indicates having an ongoing 75% relief of her right lower extremity radiculopathy.  However, she has been experiencing some no groin pain. ?Function: [name] reports improvement in function ?ROM: [name] reports improvement in ROM ? ?Pharmacotherapy Assessment  ?Analgesic: Hydrocodone/APAP 5/325, 1 tab PO q 8  hrs (15 mg/day of hydrocodone) ?MME/day: 15 mg/day.  ? ?Monitoring: ?[place] PMP: PDMP reviewed during this encounter.       ?Pharmacotherapy: No side-effects or adverse reactions reported. ?Compliance: No problems identified. ?Effectiveness: Clinically acceptable. ? ?No notes on file  UDS:  ?Summary  ?Date Value Ref Range Status  ?06/01/2021 Note  Final  ?  Comment:  ?  ==================================================================== ?[employer] Select 13 (MW) ?==================================================================== ?Test                             Result       Flag       Units ? ?Drug Present and Declared for Prescription Verification ?  Hydrocodone                    255          EXPECTED   ng/mg creat ?  Dihydrocodeine                 113          EXPECTED   ng/mg creat ?  Norhydrocodone                 1235         EXPECTED   ng/mg creat ?   Sources of hydrocodone include scheduled prescription medications. ?   Dihydrocodeine and norhydrocodone are expected metabolites of ?   hydrocodone. Dihydrocodeine is also available as a scheduled ?   prescription medication. ? ?==================================================================== ?Test                      Result    Flag   Units      Ref Range ?  Creatinine              97  mg/dL      >=20 ?==================================================================== ?Declared Medications: ? The flagging and interpretation on this report are based on the ? following declared medications.  Unexpected results may arise from ? inaccuracies in the declared medications. ? ? **Note: The testing scope of this panel includes these medications: ? ? Hydrocodone (Norco) ? ? **Note: The testing scope of this panel does not include the ? following reported medications: ? ? Acetaminophen (Norco) ? Amlodipine (Norvasc) ? Aspirin ? Calcium ? Citalopram (Celexa) ? Cyclobenzaprine (Flexeril) ? Donepezil (Aricept) ? Gabapentin (Neurontin) ? Glucosamine ?  Hydrochlorothiazide (Hydrodiuril) ? Letrozole (Femara) ? Losartan (Cozaar) ? Lubiprostone (Amitiza) ? Magnesium (Mag-Ox) ? Melatonin ? Meloxicam (Mobic) ? Metoprolol (Toprol) ? Multivitamin ? Omeprazole

## 2022-04-18 ENCOUNTER — Encounter: Payer: Self-pay | Admitting: Pain Medicine

## 2022-04-18 ENCOUNTER — Ambulatory Visit: Payer: Medicare Other | Attending: Pain Medicine | Admitting: Pain Medicine

## 2022-04-18 VITALS — BP 148/68 | HR 58 | Temp 97.3°F | Resp 14 | Ht 64.0 in | Wt 180.0 lb

## 2022-04-18 DIAGNOSIS — M79604 Pain in right leg: Secondary | ICD-10-CM | POA: Insufficient documentation

## 2022-04-18 DIAGNOSIS — M48062 Spinal stenosis, lumbar region with neurogenic claudication: Secondary | ICD-10-CM | POA: Diagnosis not present

## 2022-04-18 DIAGNOSIS — M5410 Radiculopathy, site unspecified: Secondary | ICD-10-CM | POA: Diagnosis not present

## 2022-04-18 DIAGNOSIS — R937 Abnormal findings on diagnostic imaging of other parts of musculoskeletal system: Secondary | ICD-10-CM | POA: Insufficient documentation

## 2022-04-18 DIAGNOSIS — M4807 Spinal stenosis, lumbosacral region: Secondary | ICD-10-CM | POA: Insufficient documentation

## 2022-04-18 DIAGNOSIS — M48061 Spinal stenosis, lumbar region without neurogenic claudication: Secondary | ICD-10-CM | POA: Diagnosis not present

## 2022-04-18 DIAGNOSIS — G8929 Other chronic pain: Secondary | ICD-10-CM | POA: Diagnosis not present

## 2022-04-18 DIAGNOSIS — G894 Chronic pain syndrome: Secondary | ICD-10-CM | POA: Diagnosis not present

## 2022-04-18 DIAGNOSIS — M545 Low back pain, unspecified: Secondary | ICD-10-CM | POA: Insufficient documentation

## 2022-04-18 DIAGNOSIS — M1611 Unilateral primary osteoarthritis, right hip: Secondary | ICD-10-CM | POA: Insufficient documentation

## 2022-04-18 DIAGNOSIS — M5137 Other intervertebral disc degeneration, lumbosacral region: Secondary | ICD-10-CM | POA: Insufficient documentation

## 2022-04-18 DIAGNOSIS — M5416 Radiculopathy, lumbar region: Secondary | ICD-10-CM | POA: Diagnosis not present

## 2022-04-18 DIAGNOSIS — M25551 Pain in right hip: Secondary | ICD-10-CM | POA: Insufficient documentation

## 2022-04-18 DIAGNOSIS — M47816 Spondylosis without myelopathy or radiculopathy, lumbar region: Secondary | ICD-10-CM | POA: Diagnosis not present

## 2022-04-18 NOTE — Patient Instructions (Signed)
______________________________________________________________________  Preparing for your procedure (without sedation)  Procedure appointments are limited to planned procedures: No Prescription Refills. No disability issues will be discussed. No medication changes will be discussed.  Instructions: Food Intake: Avoid eating anything for at least 4 hours prior to your procedure. Transportation: Unless otherwise stated by your physician, bring a driver. Morning Medicines: Take all of your scheduled morning medications. If you take heart medicine, except for blood thinners, do not forget to take it the morning of the procedure. If your Diastolic (lower reading) is above 100 mmHg, elective cases will be cancelled/rescheduled. Blood thinners: These will need to be stopped for procedures. Notify our staff if you are taking any blood thinners. Depending on which one you take, there will be specific instructions on how and when to stop it. Diabetics on insulin: Notify the staff so that you can be scheduled 1st case in the morning. If your diabetes requires high dose insulin, take only  of your normal insulin dose the morning of the procedure and notify the staff that you have done so. Preventing infections: Shower with an antibacterial soap the morning of your procedure.  Build-up your immune system: Take 1000 mg of Vitamin C with every meal (3 times a day) the day prior to your procedure. Antibiotics: Inform the staff if you have a condition or reason that requires you to take antibiotics before dental procedures. Pregnancy: If you are pregnant, call and cancel the procedure. Sickness: If you have a cold, fever, or any active infections, call and cancel the procedure. Arrival: You must be in the facility at least 30 minutes prior to your scheduled procedure. Children: Do not bring any children with you. Dress appropriately: There is always a possibility that your clothing may get soiled. Valuables:  Do not bring any jewelry or valuables.  Reasons to call and reschedule or cancel your procedure: (Following these recommendations will minimize the risk of a serious complication.) Surgeries: Avoid having procedures within 2 weeks of any surgery. (Avoid for 2 weeks before or after any surgery). Flu Shots: Avoid having procedures within 2 weeks of a flu shots or . (Avoid for 2 weeks before or after immunizations). Barium: Avoid having a procedure within 7-10 days after having had a radiological study involving the use of radiological contrast. (Myelograms, Barium swallow or enema study). Heart attacks: Avoid any elective procedures or surgeries for the initial 6 months after a "Myocardial Infarction" (Heart Attack). Blood thinners: It is imperative that you stop these medications before procedures. Let us know if you if you take any blood thinner.  Infection: Avoid procedures during or within two weeks of an infection (including chest colds or gastrointestinal problems). Symptoms associated with infections include: Localized redness, fever, chills, night sweats or profuse sweating, burning sensation when voiding, cough, congestion, stuffiness, runny nose, sore throat, diarrhea, nausea, vomiting, cold or Flu symptoms, recent or current infections. It is specially important if the infection is over the area that we intend to treat. Heart and lung problems: Symptoms that may suggest an active cardiopulmonary problem include: cough, chest pain, breathing difficulties or shortness of breath, dizziness, ankle swelling, uncontrolled high or unusually low blood pressure, and/or palpitations. If you are experiencing any of these symptoms, cancel your procedure and contact your primary care physician for an evaluation.  Remember:  Regular Business hours are:  Monday to Thursday 8:00 AM to 4:00 PM  Provider's Schedule: Francisco Naveira, MD:  Procedure days: Tuesday and Thursday 7:30 AM to 4:00 PM    Bilal  Lateef, MD:  Procedure days: Monday and Wednesday 7:30 AM to 4:00 PM ______________________________________________________________________   

## 2022-04-20 ENCOUNTER — Ambulatory Visit: Payer: Medicare Other | Admitting: Pain Medicine

## 2022-04-20 ENCOUNTER — Encounter: Payer: Self-pay | Admitting: Oncology

## 2022-05-03 ENCOUNTER — Other Ambulatory Visit: Payer: Self-pay | Admitting: Internal Medicine

## 2022-05-03 ENCOUNTER — Ambulatory Visit
Admission: RE | Admit: 2022-05-03 | Discharge: 2022-05-03 | Disposition: A | Discharge status: Home / Self Care | Payer: Medicare Other | Source: Ambulatory Visit | Attending: Pain Medicine | Admitting: Pain Medicine

## 2022-05-03 DIAGNOSIS — G8929 Other chronic pain: Secondary | ICD-10-CM | POA: Insufficient documentation

## 2022-05-03 DIAGNOSIS — M1611 Unilateral primary osteoarthritis, right hip: Secondary | ICD-10-CM | POA: Insufficient documentation

## 2022-05-03 DIAGNOSIS — M25551 Pain in right hip: Secondary | ICD-10-CM | POA: Diagnosis not present

## 2022-05-03 DIAGNOSIS — S73191A Other sprain of right hip, initial encounter: Secondary | ICD-10-CM | POA: Diagnosis not present

## 2022-05-03 DIAGNOSIS — M7918 Myalgia, other site: Secondary | ICD-10-CM

## 2022-05-05 ENCOUNTER — Other Ambulatory Visit: Payer: Self-pay | Admitting: Internal Medicine

## 2022-05-07 NOTE — Progress Notes (Unsigned)
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  INTERVAL HISTORY: Patient returns to clinic today for routine 54-month evaluation and continuation of Zometa.  She continues to have significant back and hip pain and plans to get repeat injections with pain clinic in the near future.  She otherwise feels well.  She does not complain of any weakness or fatigue.  She continues to have tremor from her Parkinson's, but no other neurologic complaints.  She denies any recent fevers or illnesses.  She has a good appetite and denies weight loss.  She denies any chest pain, shortness of breath, cough, or hemoptysis.  She denies any nausea, vomiting, constipation, or diarrhea.  She has no urinary complaints.  Patient offers no further specific complaints today.  REVIEW OF SYSTEMS:   Review of Systems  Constitutional: Negative.  Negative for fever, malaise/fatigue and weight loss.  Respiratory: Negative.  Negative for cough and shortness of breath.   Cardiovascular: Negative.  Negative for chest pain and leg swelling.  Gastrointestinal: Negative.  Negative for abdominal pain.  Genitourinary: Negative.   Musculoskeletal:  Positive for back pain and joint pain.  Skin: Negative.  Negative for rash.  Neurological:  Positive for tremors. Negative for sensory change, speech change, focal weakness and weakness.  Psychiatric/Behavioral: Negative.  Negative for depression. The patient is not nervous/anxious.    As per HPI. Otherwise, a complete review of systems is negative.  PAST MEDICAL HISTORY: Past Medical History:  Diagnosis Date   Acute postoperative pain 10/25/2017   Anemia    Arm pain  07/26/2015   Arthritis    Arthritis, degenerative 03/26/2014   Back pain 11/01/2013   Bladder infection 06/2018   Breast cancer ([place])    Masectomy - left - 1986    Breast cancer [facility])    Mastectomy-right -2014   CHEST PAIN 04/29/2010   Qualifier: Diagnosis of  By: Wynetta Emery RN, Erika     Chronic cystitis    Cystocele 02/22/2013   Cystocele, midline 08/19/2013   Degeneration of intervertebral disc of lumbosacral region 03/26/2014   DYSPNEA 04/29/2010   Qualifier: Diagnosis of  By: Wynetta Emery RN, Erika     Enthesopathy of hip 03/26/2014   GERD (gastroesophageal reflux disease)    Hiatal hernia    HTN (hypertension)    Hypokalemia 06/2018   Hyponatremia 06/2018   LBP (low back pain) 03/26/2014   Neck pain 11/01/2013   Parkinson disease ([place])    Pneumonia 06/2018   Sinusitis 02/07/2015   Skin lesions 07/12/2014   Urinary incontinence    mixed     PAST SURGICAL HISTORY: Past Surgical History:  Procedure Laterality Date   ABDOMINAL HYSTERECTOMY     BREAST BIOPSY  2013   BREAST ENHANCEMENT SURGERY  1987   BREAST IMPLANT REMOVAL     BREAST IMPLANT REMOVAL Right 08/29/2012   BREAST SURGERY  1986   s/p left mastectomy   COLONOSCOPY WITH PROPOFOL N/A 09/13/2016   Procedure: COLONOSCOPY WITH PROPOFOL;  Surgeon: Manya Silvas, MD;  Location: [facility] ENDOSCOPY;  Service: Endoscopy;  Laterality: N/A;   MASTECTOMY  08/2012   right   TONSILLECTOMY AND ADENOIDECTOMY  79   VESICOVAGINAL FISTULA CLOSURE W/ TAH  1983    FAMILY  HISTORY Family History  Problem Relation Age of Onset   Diabetes Father    Stroke Father    Colon polyps Father    Stroke Mother    Parkinson's disease Mother        ADVANCED DIRECTIVES:    HEALTH MAINTENANCE: Social History   Tobacco Use   Smoking status: Never   Smokeless tobacco: Never   Tobacco comments:    tobacco use - no  Vaping Use   Vaping Use: Never used  Substance Use Topics   Alcohol use: No    Alcohol/week: 0.0 standard drinks   Drug use: No      Allergies  Allergen Reactions   Sulfa Antibiotics Nausea Only   Vesicare [Solifenacin] Other (See Comments)    Constipation Constipation    Current Outpatient Medications  Medication Sig Dispense Refill   amLODipine (NORVASC) 2.5 MG tablet TAKE 1 TABLET BY MOUTH ONCE DAILY. 30 tablet 11   aspirin 81 MG EC tablet Take 81 mg by mouth daily as needed.     calcium carbonate (OSCAL) 1500 (600 Ca) MG TABS tablet Take 1 tablet by mouth daily.     citalopram (CELEXA) 20 MG tablet TAKE 1 TABLET BY MOUTH ONCE DAILY. 90 tablet 1   cyclobenzaprine (FLEXERIL) 10 MG tablet TAKE ONE TABLET BY MOUTH AT BEDTIME. 30 tablet 11   donepezil (ARICEPT) 10 MG tablet TAKE 1 TABLET BY MOUTH ONCE NIGHTLY. 30 tablet 11   donepezil (ARICEPT) 5 MG tablet      gabapentin (NEURONTIN) 300 MG capsule TAKE (2) CAPSULES BY MOUTH TWICE DAILY 120 capsule 0   GLUCOSAMINE SULFATE PO Take by mouth daily.      Glucosamine-Chondroitin 500-400 MG CAPS Take 2 capsules by mouth daily.     hydrochlorothiazide (HYDRODIURIL) 25 MG tablet TAKE 1 TABLET BY MOUTH ONCE DAILY. (PATIENT TAKES 1/2 ONCE DAILY AND EXTRA 1/2 IF NEEDED) 15 tablet 0   HYDROcodone-acetaminophen (NORCO/VICODIN) 5-325 MG tablet Take 1 tablet by mouth 3 (three) times daily. Must last 30 days. 90 tablet 0   [START ON 05/27/2022] HYDROcodone-acetaminophen (NORCO/VICODIN) 5-325 MG tablet Take 1 tablet by mouth 3 (three) times daily. Must last 30 days. 90 tablet 0   letrozole (FEMARA) 2.5 MG tablet Take 2.5 mg by mouth daily.     losartan (COZAAR) 50 MG tablet TAKE (2) TABLETS BY MOUTH ONCE DAILY. 60 tablet 11   lubiprostone (AMITIZA) 24 MCG capsule TAKE 1 CAPSULE BY MOUTH ONCE DAILY WITH BREAKFAST.     magnesium oxide (MAG-OX) 400 (241.3 Mg) MG tablet TAKE 1 TABLET BY MOUTH ONCE DAILY. 30 tablet 11   melatonin 1 MG TABS tablet Take 3 mg by mouth at bedtime.     meloxicam (MOBIC) 7.5 MG tablet TAKE 1 TABLET BY MOUTH ONCE DAILY. 30 tablet 4   metoprolol succinate  (TOPROL-XL) 25 MG 24 hr tablet TAKE 1 TABLET BY MOUTH TWICE DAILY 60 tablet 11   Multiple Vitamin (MULTIVITAMIN) tablet Take 1 tablet by mouth daily.     nitrofurantoin, macrocrystal-monohydrate, (MACROBID) 100 MG capsule Take 1 capsule (100 mg total) by mouth daily. 90 capsule 3   Omega-3 1000 MG CAPS Take by mouth.     omeprazole (PRILOSEC) 20 MG capsule TAKE 1 CAPSULE BY MOUTH TWICE DAILY FOR REFLUX. 60 capsule 1   pantoprazole (PROTONIX) 40 MG tablet TAKE 1 TABLET BY MOUTH ONCE DAILY. 30 tablet 11   rosuvastatin (CRESTOR) 20 MG tablet TAKE 1 TABLET BY MOUTH ONCE DAILY. 90 tablet 1  HYDROcodone-acetaminophen (NORCO/VICODIN) 5-325 MG tablet Take 1 tablet by mouth 3 (three) times daily. Must last 30 days. 90 tablet 0   magnesium oxide (MAG-OX) 400 (240 Mg) MG tablet TAKE 1 TABLET BY MOUTH ONCE DAILY. 30 tablet 11   No current facility-administered medications for this visit.    OBJECTIVE: Vitals:   05/09/22 1324  BP: 126/74  Pulse: (!) 50  Resp: 14  Temp: 98.4 F (36.9 C)  SpO2: 98%     Body mass index is 32.1 kg/m.    ECOG FS:0 - Asymptomatic  General: Well-developed, well-nourished, no acute distress. Eyes: Pink conjunctiva, anicteric sclera. HEENT: Normocephalic, moist mucous membranes. Lungs: No audible wheezing or coughing. Heart: Regular rate and rhythm. Abdomen: Soft, nontender, no obvious distention. Musculoskeletal: No edema, cyanosis, or clubbing. Neuro: Alert, answering all questions appropriately. Cranial nerves grossly intact. Skin: No rashes or petechiae noted. Psych: Normal affect.   LAB RESULTS:  Lab Results  Component Value Date   NA 135 05/09/2022   K 4.0 05/09/2022   CL 99 05/09/2022   CO2 28 05/09/2022   GLUCOSE 84 05/09/2022   BUN 24 (H) 05/09/2022   CREATININE 1.15 (H) 05/09/2022   CALCIUM 9.2 05/09/2022   PROT 7.1 03/07/2022   ALBUMIN 4.3 03/07/2022   AST 27 03/07/2022   ALT 18 03/07/2022   ALKPHOS 45 03/07/2022   BILITOT 0.4 03/07/2022    GFRNONAA 47 (L) 05/09/2022   GFRAA >60 07/27/2020    Lab Results  Component Value Date   WBC 6.6 03/07/2022   NEUTROABS 3.3 03/07/2022   HGB 11.0 (L) 03/07/2022   HCT 32.7 (L) 03/07/2022   MCV 87.9 03/07/2022   PLT 196.0 03/07/2022     STUDIES: MR HIP RIGHT WO CONTRAST  Result Date: 05/04/2022 CLINICAL DATA:  Hip pain, chronic, articular cartilage eval, xray done Hip pain, chronic, osteoarthritis suspected EXAM: MR OF THE RIGHT HIP WITHOUT CONTRAST TECHNIQUE: Multiplanar, multisequence MR imaging was performed. No intravenous contrast was administered. COMPARISON:  X-ray 12/30/2019 FINDINGS: Bones: No acute fracture. No dislocation. No femoral head avascular necrosis. Mild osteoarthritis of the left hip with small left hip joint effusion. Bony pelvis intact without diastasis. SI joints and pubic symphysis within normal limits. Advanced multilevel lumbar spondylosis not well characterized on large field-of-view coronal images of the pelvis. No bone marrow edema. No marrow replacing bone lesion. Articular cartilage and labrum Articular cartilage: Mild-moderate chondral thinning and surface irregularity, most pronounced superiorly. Mild reactive subchondral marrow signal changes in the superior acetabulum. Labrum: Extensive superior labral tear with large multilobulated complex paralabral cyst measuring 5.3 x 2.0 x 3.1 cm. Joint or bursal effusion Joint effusion:  No right hip joint effusion. Bursae: Small left and trace right peritrochanteric bursal fluid collections. Muscles and tendons Muscles and tendons: Tendinosis and partial-thickness insertional tearing of the bilateral gluteus minimus tendons. The gluteus medius, hamstring, iliopsoas, rectus femoris, and adductor tendons appear intact without tear or significant tendinosis. Normal muscle bulk and signal intensity without edema, atrophy, or fatty infiltration. Other findings Miscellaneous: No soft tissue edema or fluid collection. No  inguinal lymphadenopathy. Sigmoid diverticulosis. IMPRESSION: 1. Mild-to-moderate osteoarthritis of the bilateral hips, right worse than left. 2. Extensive right-sided superior labral tear with large complex paralabral cyst measuring up to 5.3 cm. 3. Tendinosis and partial-thickness insertional tearing of the bilateral gluteus minimus tendons. 4. Small left and trace right peritrochanteric bursal fluid collections. Electronically Signed   By: Davina Poke D.O.   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  PLAN:    1.  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  Patient completed 5 years of letrozole in May 2019.  No further interventions are needed.  Patient has had bilateral mastectomies, therefore no further mammograms are necessary.  Return to clinic as scheduled for Prolia. 2.  Osteoporosis: Patient's most recent bone mineral density on July 04, 2021 revealed continuous improvement of her T score at -2.6.  Previously T score was reported at -2.9.  Patient previously received Prolia, but this has been switched to Zometa secondary to insurance purposes.  Continue calcium and vitamin D supplementation.  Return to clinic in 6 months with repeat laboratory work, further evaluation, and continuation of treatment.  Repeat bone mineral density in July 2023.   3.  Tremors/Parkinson's: Chronic and unchanged.  Treatment per neurology.  4.  Back/joint pain: Patient reports she has an appointment with pain clinic in the near future.  Patient expressed understanding and was in agreement with this plan. She also understands that She can call clinic at any time with any questions, concerns, or complaints.   Breast cancer [facility])   Staging form: Breast, AJCC 7th Edition     Clinical stage from  09/22/2015: Stage IIA (T1c, N1, M0) - Signed by Lloyd Huger, MD on 09/22/2015   Lloyd Huger, MD   05/10/2022 6:35 AM

## 2022-05-09 ENCOUNTER — Inpatient Hospital Stay: Payer: Medicare Other | Attending: Oncology

## 2022-05-09 ENCOUNTER — Encounter: Payer: Self-pay | Admitting: Oncology

## 2022-05-09 ENCOUNTER — Other Ambulatory Visit: Payer: Self-pay | Admitting: *Deleted

## 2022-05-09 ENCOUNTER — Inpatient Hospital Stay: Payer: Medicare Other | Admitting: Oncology

## 2022-05-09 ENCOUNTER — Inpatient Hospital Stay: Payer: Medicare Other

## 2022-05-09 ENCOUNTER — Other Ambulatory Visit: Payer: Self-pay | Admitting: Internal Medicine

## 2022-05-09 VITALS — BP 126/74 | HR 50 | Temp 98.4°F | Resp 14 | Ht 64.0 in | Wt 187.0 lb

## 2022-05-09 DIAGNOSIS — M818 Other osteoporosis without current pathological fracture: Secondary | ICD-10-CM

## 2022-05-09 DIAGNOSIS — G20 Parkinson's disease: Secondary | ICD-10-CM | POA: Diagnosis not present

## 2022-05-09 DIAGNOSIS — Z170 Estrogen receptor positive status [ER+]: Secondary | ICD-10-CM | POA: Insufficient documentation

## 2022-05-09 DIAGNOSIS — Z9013 Acquired absence of bilateral breasts and nipples: Secondary | ICD-10-CM | POA: Insufficient documentation

## 2022-05-09 DIAGNOSIS — M810 Age-related osteoporosis without current pathological fracture: Secondary | ICD-10-CM

## 2022-05-09 DIAGNOSIS — C50211 Malignant neoplasm of upper-inner quadrant of right female breast: Secondary | ICD-10-CM

## 2022-05-09 DIAGNOSIS — Z9221 Personal history of antineoplastic chemotherapy: Secondary | ICD-10-CM | POA: Insufficient documentation

## 2022-05-09 DIAGNOSIS — Z79811 Long term (current) use of aromatase inhibitors: Secondary | ICD-10-CM | POA: Diagnosis not present

## 2022-05-09 DIAGNOSIS — Z79899 Other long term (current) drug therapy: Secondary | ICD-10-CM | POA: Insufficient documentation

## 2022-05-09 LAB — BASIC METABOLIC PANEL
Anion gap: 8 (ref 5–15)
BUN: 24 mg/dL — ABNORMAL HIGH (ref 8–23)
CO2: 28 mmol/L (ref 22–32)
Calcium: 9.2 mg/dL (ref 8.9–10.3)
Chloride: 99 mmol/L (ref 98–111)
Creatinine, Ser: 1.15 mg/dL — ABNORMAL HIGH (ref 0.44–1.00)
GFR, Estimated: 47 mL/min — ABNORMAL LOW (ref >60)
Glucose, Bld: 84 mg/dL (ref 70–99)
Potassium: 4 mmol/L (ref 3.5–5.1)
Sodium: 135 mmol/L (ref 135–145)

## 2022-05-09 MED ORDER — SODIUM CHLORIDE 0.9 % IV SOLN
Freq: Once | INTRAVENOUS | Status: AC
  Filled 2022-05-09: qty 250

## 2022-05-09 MED ORDER — ZOLEDRONIC ACID 4 MG/5ML IV CONC
3.5000 mg | Freq: Once | INTRAVENOUS | Status: AC
  Administered 2022-05-09: 3.5 mg via INTRAVENOUS
  Filled 2022-05-09: qty 4.38

## 2022-05-09 NOTE — Patient Instructions (Signed)

## 2022-05-09 NOTE — Progress Notes (Signed)
Creatinine 1.15, per Dr. Grayland Ormond okay to proceed with Zometa.

## 2022-05-09 NOTE — Progress Notes (Signed)
B

## 2022-05-10 ENCOUNTER — Encounter: Payer: Self-pay | Admitting: Oncology

## 2022-05-17 ENCOUNTER — Encounter: Payer: Self-pay | Admitting: Orthopedic Surgery

## 2022-05-17 ENCOUNTER — Ambulatory Visit: Payer: Medicare Other | Admitting: Surgical

## 2022-05-17 DIAGNOSIS — M1711 Unilateral primary osteoarthritis, right knee: Secondary | ICD-10-CM

## 2022-05-17 MED ORDER — SODIUM HYALURONATE 60 MG/3ML IX PRSY
60.0000 mg | PREFILLED_SYRINGE | INTRA_ARTICULAR | Status: AC | PRN
  Administered 2022-05-17: 60 mg via INTRA_ARTICULAR

## 2022-05-17 MED ORDER — LIDOCAINE HCL 1 % IJ SOLN
5.0000 mL | INTRAMUSCULAR | Status: AC | PRN
  Administered 2022-05-17: 5 mL

## 2022-05-17 NOTE — Progress Notes (Signed)
   Procedure Note  Patient: Wanda Martinez             Date of Birth: 05-26-1935           MRN: [id number]             Visit Date: 05/17/2022  Procedures: Visit Diagnoses:  1. Unilateral primary osteoarthritis, right knee     Large Joint Inj: R knee on 05/17/2022 11:00 AM Indications: diagnostic evaluation, joint swelling and pain Details: 18 G 1.5 in needle, superolateral approach  Arthrogram: No  Medications: 5 mL lidocaine 1 %; 60 mg Sodium Hyaluronate 60 MG/3ML Aspirate: 20 mL Outcome: tolerated well, no immediate complications  Patient reports good relief from her last cortisone injection on 02/24/2022 but she cannot remember how long it lasted.  She has never had gel injection before.  Tolerated Durolane injection well with 20 cc aspiration.  She will follow-up with the office as needed.  If pain recurs, recommend she call the office and we can try repeat cortisone injection about 3 months from now. Procedure, treatment alternatives, risks and benefits explained, specific risks discussed. Consent was given by the patient. Immediately prior to procedure a time out was called to verify the correct patient, procedure, equipment, support staff and site/side marked as required. Patient was prepped and draped in the usual sterile fashion.

## 2022-05-30 ENCOUNTER — Ambulatory Visit: Payer: Medicare Other | Attending: Pain Medicine | Admitting: Pain Medicine

## 2022-05-30 ENCOUNTER — Encounter: Payer: Self-pay | Admitting: Pain Medicine

## 2022-05-30 ENCOUNTER — Ambulatory Visit
Admission: RE | Admit: 2022-05-30 | Discharge: 2022-05-30 | Disposition: A | Discharge status: Home / Self Care | Payer: Medicare Other | Source: Ambulatory Visit | Attending: Pain Medicine | Admitting: Pain Medicine

## 2022-05-30 ENCOUNTER — Ambulatory Visit: Payer: Medicare Other | Admitting: Surgical

## 2022-05-30 VITALS — BP 116/88 | HR 50 | Resp 14 | Ht 64.0 in | Wt 180.0 lb

## 2022-05-30 DIAGNOSIS — M25551 Pain in right hip: Secondary | ICD-10-CM | POA: Diagnosis not present

## 2022-05-30 DIAGNOSIS — G8929 Other chronic pain: Secondary | ICD-10-CM | POA: Diagnosis not present

## 2022-05-30 DIAGNOSIS — M1611 Unilateral primary osteoarthritis, right hip: Secondary | ICD-10-CM | POA: Insufficient documentation

## 2022-05-30 DIAGNOSIS — R935 Abnormal findings on diagnostic imaging of other abdominal regions, including retroperitoneum: Secondary | ICD-10-CM | POA: Insufficient documentation

## 2022-05-30 MED ORDER — IOHEXOL 180 MG/ML  SOLN
INTRAMUSCULAR | Status: AC
  Filled 2022-05-30: qty 20

## 2022-05-30 MED ORDER — ROPIVACAINE HCL 2 MG/ML IJ SOLN
INTRAMUSCULAR | Status: AC
  Filled 2022-05-30: qty 20

## 2022-05-30 MED ORDER — LIDOCAINE HCL 2 % IJ SOLN
20.0000 mL | Freq: Once | INTRAMUSCULAR | Status: AC
  Administered 2022-05-30: 400 mg

## 2022-05-30 MED ORDER — PENTAFLUOROPROP-TETRAFLUOROETH EX AERO
INHALATION_SPRAY | Freq: Once | CUTANEOUS | Status: AC
  Administered 2022-05-30: 30 via TOPICAL

## 2022-05-30 MED ORDER — IOHEXOL 180 MG/ML  SOLN
10.0000 mL | Freq: Once | INTRAMUSCULAR | Status: AC
  Administered 2022-05-30: 10 mL via INTRA_ARTICULAR

## 2022-05-30 MED ORDER — METHYLPREDNISOLONE ACETATE 80 MG/ML IJ SUSP
INTRAMUSCULAR | Status: AC
  Filled 2022-05-30: qty 1

## 2022-05-30 MED ORDER — METHYLPREDNISOLONE ACETATE 80 MG/ML IJ SUSP
80.0000 mg | Freq: Once | INTRAMUSCULAR | Status: AC
  Administered 2022-05-30: 80 mg via INTRA_ARTICULAR

## 2022-05-30 MED ORDER — ROPIVACAINE HCL 2 MG/ML IJ SOLN
9.0000 mL | Freq: Once | INTRAMUSCULAR | Status: AC
  Administered 2022-05-30: 20 mL via INTRA_ARTICULAR

## 2022-05-30 MED ORDER — LIDOCAINE HCL 2 % IJ SOLN
INTRAMUSCULAR | Status: AC
  Filled 2022-05-30: qty 20

## 2022-05-30 NOTE — Progress Notes (Signed)
Safety precautions to be maintained throughout the outpatient stay will include: orient to surroundings, keep bed in low position, maintain call bell within reach at all times, provide assistance with transfer out of bed and ambulation.  

## 2022-05-30 NOTE — Patient Instructions (Signed)

## 2022-05-30 NOTE — Progress Notes (Signed)
PROVIDER NOTE: Interpretation of information contained herein should be left to medically-trained personnel. Specific patient instructions are provided elsewhere under "Patient Instructions" section of medical record. This document was created in part using STT-dictation technology, any transcriptional errors that may result from this process are unintentional.  Patient: Wanda Martinez Type: Established DOB: 08-03-1935 MRN: [id number] PCP: Einar Pheasant, MD  Service: Procedure DOS: 05/30/2022 Setting: Ambulatory Location: Ambulatory outpatient facility Delivery: Face-to-face Provider: Gaspar Cola, MD Specialty: Interventional Pain Management Specialty designation: 09 Location: Outpatient facility Ref. Prov.: Einar Pheasant, MD    Primary Reason for Visit: Interventional Pain Management Treatment. CC: Back Pain (Upper rib area and low) and Knee Pain (right)    Procedure:          Anesthesia, Analgesia, Anxiolysis:  Type: Intra-Articular Hip Injection #2  Primary Purpose: Diagnostic Region: Posterolateral hip joint area. Level: Lower pelvic and hip joint level. Target Area: Superior aspect of the hip joint cavity, going thru the superior portion of the capsular ligament. Approach: Posterolateral approach. Laterality: Right  Anesthesia: Local (1-2% Lidocaine)  Anxiolysis: None  Sedation: None  Guidance: Fluoroscopy           Position: Lateral Decubitus with bad side up Prepped Area: Entire Posterolateral hip area. DuraPrep (Iodine Povacrylex [0.7% available iodine] and Isopropyl Alcohol, 74% w/w)   1. Chronic hip pain (Right)   2. Osteoarthritis of hip (Right)   3. Abnormal MRI, hip (Right) (05/04/2022)    NAS-11 Pain score:   Pre-procedure: 7 /10   Post-procedure: 0-No pain/10     Pre-op H&P Assessment:  Wanda Martinez is a 87 y.o. (year old), female patient, seen today for interventional treatment. She  has a past surgical history that includes Tonsillectomy and  adenoidectomy (81); Vesicovaginal fistula closure w/ TAH (1983); Breast surgery (1986); Breast enhancement surgery (1987); Breast implant removal; Breast implant removal (Right, 08/29/2012); Mastectomy (08/2012); Abdominal hysterectomy; Colonoscopy with propofol (N/A, 09/13/2016); and Breast biopsy (2013). Wanda Martinez has a current medication list which includes the following prescription(s): amlodipine, aspirin ec, calcium carbonate, citalopram, cyclobenzaprine, donepezil, donepezil, gabapentin, glucosamine sulfate, glucosamine-chondroitin, hydrochlorothiazide, hydrocodone-acetaminophen, letrozole, losartan, lubiprostone, magnesium oxide, magnesium oxide, melatonin, meloxicam, metoprolol succinate, multivitamin, nitrofurantoin (macrocrystal-monohydrate), omega-3, omeprazole, pantoprazole, rosuvastatin, hydrocodone-acetaminophen, and hydrocodone-acetaminophen. Her primarily concern today is the Back Pain (Upper rib area and low) and Knee Pain (right)  Initial Vital Signs:  Pulse/HCG Rate: (!) 51ECG Heart Rate: (!) 50 Temp:   Resp: 18 BP: (!) 92/51 SpO2: 98 %  BMI: Estimated body mass index is 30.9 kg/m as calculated from the following:   Height as of this encounter: '5\' 4"'$  (1.626 m).   Weight as of this encounter: 180 lb (81.6 kg).  Risk Assessment: Allergies: Reviewed. She is allergic to sulfa antibiotics and vesicare [solifenacin].  Allergy Precautions: None required Coagulopathies: Reviewed. None identified.  Blood-thinner therapy: None at this time Active Infection(s): Reviewed. None identified. Wanda Martinez is afebrile  Site Confirmation: Wanda Martinez was asked to confirm the procedure and laterality before marking the site Procedure checklist: Completed Consent: Before the procedure and under the influence of no sedative(s), amnesic(s), or anxiolytics, the patient was informed of the treatment options, risks and possible complications. To fulfill our ethical and legal obligations, as recommended  by the American Medical Association's Code of Ethics, I have informed the patient of my clinical impression; the nature and purpose of the treatment or procedure; the risks, benefits, and possible complications of the intervention; the alternatives, including doing nothing; the risk(s) and benefit(s) of  the alternative treatment(s) or procedure(s); and the risk(s) and benefit(s) of doing nothing. The patient was provided information about the general risks and possible complications associated with the procedure. These may include, but are not limited to: failure to achieve desired goals, infection, bleeding, organ or nerve damage, allergic reactions, paralysis, and death. In addition, the patient was informed of those risks and complications associated to the procedure, such as failure to decrease pain; infection; bleeding; organ or nerve damage with subsequent damage to sensory, motor, and/or autonomic systems, resulting in permanent pain, numbness, and/or weakness of one or several areas of the body; allergic reactions; (i.e.: anaphylactic reaction); and/or death. Furthermore, the patient was informed of those risks and complications associated with the medications. These include, but are not limited to: allergic reactions (i.e.: anaphylactic or anaphylactoid reaction(s)); adrenal axis suppression; blood sugar elevation that in diabetics may result in ketoacidosis or comma; water retention that in patients with history of congestive heart failure may result in shortness of breath, pulmonary edema, and decompensation with resultant heart failure; weight gain; swelling or edema; medication-induced neural toxicity; particulate matter embolism and blood vessel occlusion with resultant organ, and/or nervous system infarction; and/or aseptic necrosis of one or more joints. Finally, the patient was informed that Medicine is not an exact science; therefore, there is also the possibility of unforeseen or unpredictable  risks and/or possible complications that may result in a catastrophic outcome. The patient indicated having understood very clearly. We have given the patient no guarantees and we have made no promises. Enough time was given to the patient to ask questions, all of which were answered to the patient's satisfaction. Ms. Muller has indicated that she wanted to continue with the procedure. Attestation: I, the ordering provider, attest that I have discussed with the patient the benefits, risks, side-effects, alternatives, likelihood of achieving goals, and potential problems during recovery for the procedure that I have provided informed consent. Date  Time: 05/30/2022 11:16 AM  Pre-Procedure Preparation:  Monitoring: As per clinic protocol. Respiration, ETCO2, SpO2, BP, heart rate and rhythm monitor placed and checked for adequate function Safety Precautions: Patient was assessed for positional comfort and pressure points before starting the procedure. Time-out: I initiated and conducted the "Time-out" before starting the procedure, as per protocol. The patient was asked to participate by confirming the accuracy of the "Time Out" information. Verification of the correct person, site, and procedure were performed and confirmed by me, the nursing staff, and the patient. "Time-out" conducted as per Joint Commission's Universal Protocol (UP.01.01.01). Time: 1140  Description of Procedure:          Safety Precautions: Aspiration looking for blood return was conducted prior to all injections. At no point did we inject any substances, as a needle was being advanced. No attempts were made at seeking any paresthesias. Safe injection practices and needle disposal techniques used. Medications properly checked for expiration dates. SDV (single dose vial) medications used. Description of the Procedure: Protocol guidelines were followed. The patient was placed in position over the fluoroscopy table. The target area was  identified and the area prepped in the usual manner. Skin & deeper tissues infiltrated with local anesthetic. Appropriate amount of time allowed to pass for local anesthetics to take effect. The procedure needles were then advanced to the target area. Proper needle placement secured. Negative aspiration confirmed. Solution injected in intermittent fashion, asking for systemic symptoms every 0.5cc of injectate. The needles were then removed and the area cleansed, making sure to leave some  of the prepping solution back to take advantage of its long term bactericidal properties. Vitals:   05/30/22 1116 05/30/22 1127 05/30/22 1141 05/30/22 1146  BP:  (!) 92/51 138/71 116/88  Pulse: (!) 51 (!) 50    Resp:  '18 14 14  '$ SpO2:  98% 99% 100%  Weight: 180 lb (81.6 kg)     Height: '5\' 4"'$  (1.626 m)       Start Time: 1141 hrs. End Time: 1145 hrs. Materials:  Needle(s) Type: Spinal Needle Gauge: 22G Length: 7.0-in Medication(s): Please see orders for medications and dosing details.  Imaging Guidance (Non-Spinal):          Type of Imaging Technique: Fluoroscopy Guidance (Non-Spinal) Indication(s): Assistance in needle guidance and placement for procedures requiring needle placement in or near specific anatomical locations not easily accessible without such assistance. Exposure Time: Please see nurses notes. Contrast: Before injecting any contrast, we confirmed that the patient did not have an allergy to iodine, shellfish, or radiological contrast. Once satisfactory needle placement was completed at the desired level, radiological contrast was injected. Contrast injected under live fluoroscopy. No contrast complications. See chart for type and volume of contrast used. Fluoroscopic Guidance: I was personally present during the use of fluoroscopy. "Tunnel Vision Technique" used to obtain the best possible view of the target area. Parallax error corrected before commencing the procedure. "Direction-depth-direction"  technique used to introduce the needle under continuous pulsed fluoroscopy. Once target was reached, antero-posterior, oblique, and lateral fluoroscopic projection used confirm needle placement in all planes. Images permanently stored in EMR. Interpretation: I personally interpreted the imaging intraoperatively. Adequate needle placement confirmed in multiple planes. Appropriate spread of contrast into desired area was observed. No evidence of afferent or efferent intravascular uptake. Permanent images saved into the patient's record.  Antibiotic Prophylaxis:   Anti-infectives (From admission, onward)    None      Indication(s): None identified  Post-operative Assessment:  Post-procedure Vital Signs:  Pulse/HCG Rate: (!) 50(!) 48 Temp:   Resp: 14 BP: 116/88 SpO2: 100 %  EBL: None  Complications: No immediate post-treatment complications observed by team, or reported by patient.  Note: The patient tolerated the entire procedure well. A repeat set of vitals were taken after the procedure and the patient was kept under observation following institutional policy, for this type of procedure. Post-procedural neurological assessment was performed, showing return to baseline, prior to discharge. The patient was provided with post-procedure discharge instructions, including a section on how to identify potential problems. Should any problems arise concerning this procedure, the patient was given instructions to immediately contact us, at any time, without hesitation. In any case, we plan to contact the patient by telephone for a follow-up status report regarding this interventional procedure.  Comments:  No additional relevant information.  Plan of Care  Orders:  Orders Placed This Encounter  Procedures   HIP INJECTION    Scheduling Instructions:     Side: Right-sided     Sedation: Patient's choice.     Timeframe: Today   DG PAIN CLINIC C-ARM 1-60 MIN NO REPORT    Intraoperative  interpretation by procedural physician at [facility].    Standing Status:   Standing    Number of Occurrences:   1    Order Specific Question:   Reason for exam:    Answer:   Assistance in needle guidance and placement for procedures requiring needle placement in or near specific anatomical locations not easily accessible without such assistance.  Informed Consent Details: Physician/Practitioner Attestation; Transcribe to consent form and obtain patient signature    Nursing Order: Transcribe to consent form and obtain patient signature. Note: Always confirm laterality of pain with Ms. Ermalinda Barrios, before procedure.    Order Specific Question:   Physician/Practitioner attestation of informed consent for procedure/surgical case    Answer:   I, the physician/practitioner, attest that I have discussed with the patient the benefits, risks, side effects, alternatives, likelihood of achieving goals and potential problems during recovery for the procedure that I have provided informed consent.    Order Specific Question:   Procedure    Answer:   Hip injection    Order Specific Question:   Physician/Practitioner performing the procedure    Answer:   Mahati Vajda A. Dossie Arbour, MD    Order Specific Question:   Indication/Reason    Answer:   Hip Joint Pain (Arthralgia)   Provide equipment / supplies at bedside    "Block Tray" (Disposable  single use) Needle type: SpinalSpinal Amount/quantity: 1 Size: Long (7-inch) Gauge: 22G    Standing Status:   Standing    Number of Occurrences:   1    Order Specific Question:   Specify    Answer:   Block Tray   Chronic Opioid Analgesic:  Hydrocodone/APAP 5/325, 1 tab PO q 8 hrs (15 mg/day of hydrocodone) MME/day: 15 mg/day.   Medications ordered for procedure: Meds ordered this encounter  Medications   iohexol (OMNIPAQUE) 180 MG/ML injection 10 mL    Must be Myelogram-compatible. If not available, you may substitute with a water-soluble, non-ionic,  hypoallergenic, myelogram-compatible radiological contrast medium.   lidocaine (XYLOCAINE) 2 % (with pres) injection 400 mg   pentafluoroprop-tetrafluoroeth (GEBAUERS) aerosol   methylPREDNISolone acetate (DEPO-MEDROL) injection 80 mg   ropivacaine (PF) 2 mg/mL (0.2%) (NAROPIN) injection 9 mL   Medications administered: We administered iohexol, lidocaine, pentafluoroprop-tetrafluoroeth, methylPREDNISolone acetate, and ropivacaine (PF) 2 mg/mL (0.2%).  See the medical record for exact dosing, route, and time of administration.  Follow-up plan:   Return in about 2 weeks (around 06/13/2022) for Proc-day (T,Th), (F2F), (PPE).       Interventional Therapies  Risk  Complexity Considerations:   Estimated body mass index is 30.73 kg/m as calculated from the following:   Height as of this encounter: '5\' 4"'$  (1.626 m).   Weight as of this encounter: 179 lb (81.2 kg). WNL   Planned  Pending:   Diagnostic right hip MRI Therapeutic right IA hip inj. #2    Under consideration:   Diagnostic right genicular NB  Diagnostic bilateral cervical facet MBB    Completed:   Therapeutic right superior/lateral trapezius muscle TPI x1 (04/19/2021)  Therapeutic right thoracic back TPI x2 (10/17/2018)  (DKFUA)  Therapeutic left thoracic back TPI x1 (10/17/2018)  (DKFUA)  Therapeutic right quadratus lumborum and erector spinae muscle TPI x1 (02/12/2018)  Therapeutic bilateral lumbar facet MBB x5 (08/09/2021) (DKFUA)  Therapeutic right lumbar facet MBB x4 (12/21/2020) (DKFUA)  Therapeutic right lumbar facet RFA x2 (06/04/2018) (100/90/75/75-100)  Therapeutic left lumbar facet RFA x1 (02/12/2018) (100/60/90/>75)  Diagnostic right SI joint Blk x3 (12/21/2020)  (DKFUA)  Diagnostic left SI joint Blk x1 (08/09/2017) (100/90/20/<25)  Therapeutic right SI joint RFA x3 (06/05/2019) (100/100/85/>50)  Therapeutic midline L2-3 LESI x1 (10/04/2017) (100/100/85/>50)  Therapeutic right L3-4 LESI x2 (11/04/2020)  (100/100/20/<50)  Therapeutic right L4-5 LESI x2 (03/30/2022) (100/100/75/75)  Therapeutic right L5-S1 LESI x2 (12/04/2019) (100/100/50/75)  Therapeutic right L5 TFESI x4 (02/14/2022) (100/100/70/75)  Therapeutic right S1 TFESI x1 (  11/04/2020) (100/100/20/<50)  Diagnostic bilateral superficial trochanteric bursa injec. x1 (04/30/2018) [facility])  Diagnostic right IA hip injection x1 (02/13/2019) (DKFUA)  Diagnostic/therapeutic right (steroid) knee injection x1 (04/19/2021) (100/100/50/50)  Therapeutic right (Zilretta) knee injection x1 (10/06/2021)  (DKFUA)  Therapeutic right (Monovisc) knee injection x1 (06/07/2021) (100/100/100/75)    Therapeutic  Palliative (PRN) options:   Therapeutic/palliative lumbar facet RFA  Therapeutic/palliative lumbar facet MBB  Therapeutic left L2 TFESI (this level is responsible for her low back pain and left lower extremity pain.)     Recent Visits Date Type Provider Dept  04/18/22 Office Visit Milinda Pointer, MD [facility]  03/30/22 Procedure visit Milinda Pointer, MD [facility]  03/20/22 Office Visit Milinda Pointer, MD [facility]  Showing recent visits within past 90 days and meeting all other requirements Today's Visits Date Type Provider Dept  05/30/22 Procedure visit Milinda Pointer, MD [facility]  Showing today's visits and meeting all other requirements Future Appointments Date Type Provider Dept  06/12/22 Appointment Milinda Pointer, MD [facility]  06/15/22 Appointment Milinda Pointer, MD [facility]  Showing future appointments within next 90 days and meeting all other requirements  Disposition: Discharge home  Discharge (Date  Time): 05/30/2022; 1155 hrs.   Primary Care Physician: Einar Pheasant, MD Location: [facility] Note by: Gaspar Cola, MD Date: 05/30/2022; Time: 4:43 PM  Disclaimer:  Medicine is not an Armed forces logistics/support/administrative officer. The only guarantee in medicine is that nothing is guaranteed. It is important to note that the decision to proceed with this intervention was based on the information collected from the patient. The Data and conclusions were drawn from the patient's questionnaire, the interview, and the physical examination. Because the information was provided in large part by the patient, it cannot be guaranteed that it has not been purposely or unconsciously manipulated. Every effort has been made to obtain as much relevant data as possible for this evaluation. It is important to note that the conclusions that lead to this procedure are derived in large part from the available data. Always take into account that the treatment will also be dependent on availability of resources and existing treatment guidelines, considered by other Pain Management Practitioners as being common knowledge and practice, at the time of the intervention. For Medico-Legal purposes, it is also important to point out that variation in procedural techniques and pharmacological choices are the acceptable norm. The indications, contraindications, technique, and results of the above procedure should only be interpreted and judged by a Board-Certified Interventional Pain Specialist with extensive familiarity and expertise in the same exact procedure and technique.

## 2022-05-31 ENCOUNTER — Telehealth: Payer: Self-pay

## 2022-05-31 NOTE — Telephone Encounter (Signed)
Post procedure phone call. Patient states she is doing good.  

## 2022-06-02 ENCOUNTER — Other Ambulatory Visit: Payer: Self-pay | Admitting: Internal Medicine

## 2022-06-05 ENCOUNTER — Other Ambulatory Visit: Payer: Self-pay | Admitting: Internal Medicine

## 2022-06-05 DIAGNOSIS — E7800 Pure hypercholesterolemia, unspecified: Secondary | ICD-10-CM

## 2022-06-10 NOTE — Progress Notes (Signed)
PROVIDER NOTE: Information contained herein reflects review and annotations entered in association with encounter. Interpretation of such information and data should be left to medically-trained personnel. Information provided to patient can be located elsewhere in the medical record under "Patient Instructions". Document created using STT-dictation technology, any transcriptional errors that may result from process are unintentional.    Patient: Wanda Martinez Number  Service Category: E/M  Provider: Oswaldo Done, MD  DOB: 11-19-1935  DOS: 06/12/2022  Specialty: Interventional Pain Management  MRN: [id number]  Setting: Ambulatory outpatient  PCP: Dale Riley, MD  Type: Established Patient    Referring Provider: Dale , MD  Location: Office  Delivery: Face-to-face     HPI  [name], a 87 y.o. year old female, is here today because of her Chronic pain syndrome [G89.4]. Ms. You primary complain today is Back Pain (Lumbar bilateral ), Hip Pain (Right ), and Knee Pain (Right ) Last encounter: My last encounter with her was on 05/30/2022. Pertinent problems: [name] has Headache; Parkinson's disease (HCC); Chronic low back pain (1ry area of Pain) (Bilateral) (R>L) w/o sciatica; Lumbar spondylosis; Chronic hip pain (Bilateral); Chronic neck pain; Cervical spondylosis; Chronic cervical radicular pain (Right); Diffuse myofascial pain syndrome; Neurogenic pain; Chronic upper back pain (Right); Myofascial pain syndrome (Right) (cervicothoracic); Lumbar facet syndrome (Bilateral) (R>L); History of breast cancer; Cervical facet hypertrophy; Cervical facet syndrome (HCC); Chronic shoulder pain (Right); Chronic pain syndrome; Chronic sacroiliac joint pain (Left); Chronic sacroiliac joint pain (Right); 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, L4-5, L5-S1) (Right); Lumbar spinal stenosis (with neurogenic claudication) (L3-4); Chronic lower extremity pain (2ry area of Pain) (Right);  Chronic lumbar radicular pain (S1) (Right); Trochanteric bursitis of hip (Bilateral); Spondylosis without myelopathy or radiculopathy, lumbosacral region; Trigger point with back pain (Right); DDD (degenerative disc disease), lumbosacral; Chronic upper extremity pain (Right); Chronic thoracic back pain (Bilateral) (L>R); Trigger point of thoracic region (Bilateral) (L>R); Other specified dorsopathies, sacral and sacrococcygeal region; Sacroiliac joint dysfunction (Right); Osteoarthritis of sacroiliac joint (Right); Somatic dysfunction of sacroiliac joint (Right); Chronic musculoskeletal pain; Facial pain; Chronic hip pain (Right); Osteoarthritis of hip (Right); Left ear pain; Abnormal MRI, lumbar spine (12/30/2019); Osteoarthritis involving multiple joints; Other spondylosis, sacral and sacrococcygeal region; Lumbar facet hypertrophy (Multilevel) (Bilateral); Chronic knee pain (Right); Osteoarthritis of knee (Right); Trigger point of shoulder region (Right); Osteoarthritis of AC (acromioclavicular) joint (Right); Osteoarthritis of shoulder (Right); Unspecified injury of muscle(s) and tendon(s) of the rotator cuff of shoulder, sequela (Right); Cervicalgia; DDD (degenerative disc disease), lumbar; Lumbar radicular pain (Bilateral); Lumbar foraminal stenosis (Right: L3-4, L4-5, L5-S1) (Left: L2-3); and Abnormal MRI, hip (Right) (05/04/2022) on their pertinent problem list. Pain Assessment: Severity of Chronic pain is reported as a 7 /10. Location: Back Lower, Left, Right/down the right leg. Onset: More than a month ago. Quality: Discomfort, Constant, Aching, Dull. Timing: Constant. Modifying factor(s): medications. Vitals:  height is 5\' 4"  (1.626 m) and weight is 180 lb (81.6 kg). Her temporal temperature is 97.2 F (36.2 C) (abnormal). Her blood pressure is 106/50 (abnormal) and her pulse is 52 (abnormal). Her respiration is 16 and oxygen saturation is 100%.   Reason for encounter: both, medication management and  post-procedure evaluation and assessment.  The patient indicates doing well with the current medication regimen. No adverse reactions or side effects reported to the medications.   The patient indicates having attained 100% relief of the pain for the duration of the local anesthetic after the right intra-articular hip joint injection done on 05/30/2022.  She  comes in today indicating that she is scheduled for a knee injection  RTCB: 09/24/2022 Nonopioids transfer 12/21/2020: Gabapentin  Post-procedure evaluation    Procedure:          Anesthesia, Analgesia, Anxiolysis:  Type: Intra-Articular Hip Injection #2  Primary Purpose: Diagnostic Region: Posterolateral hip joint area. Level: Lower pelvic and hip joint level. Target Area: Superior aspect of the hip joint cavity, going thru the superior portion of the capsular ligament. Approach: Posterolateral approach. Laterality: Right  Anesthesia: Local (1-2% Lidocaine)  Anxiolysis: None  Sedation: None  Guidance: Fluoroscopy           Position: Lateral Decubitus with bad side up Prepped Area: Entire Posterolateral hip area. DuraPrep (Iodine Povacrylex [0.7% available iodine] and Isopropyl Alcohol, 74% w/w)   1. Chronic hip pain (Right)   2. Osteoarthritis of hip (Right)   3. Abnormal MRI, hip (Right) (05/04/2022)    NAS-11 Pain score:   Pre-procedure: 7 /10   Post-procedure: 0-No pain/10      Effectiveness:  Initial hour after procedure: 100 %. Subsequent 4-6 hours post-procedure: 100 %. Analgesia past initial 6 hours: 75 %. Ongoing improvement:  Analgesic: The patient indicates having attained an ongoing 75% relief of the right hip pain. Function: [name] reports improvement in function ROM: [name] reports improvement in ROM  Pharmacotherapy Assessment  Analgesic: Hydrocodone/APAP 5/325, 1 tab PO q 8 hrs (15 mg/day of hydrocodone) MME/day: 15 mg/day.   Monitoring: [place] PMP: PDMP reviewed during this encounter.        Pharmacotherapy: No side-effects or adverse reactions reported. Compliance: No problems identified. Effectiveness: Clinically acceptable.  Vernie Ammons, RN  06/12/2022  1:57 PM  Sign when Signing Visit Nursing Pain Medication Assessment:  Safety precautions to be maintained throughout the outpatient stay will include: orient to surroundings, keep bed in low position, maintain call bell within reach at all times, provide assistance with transfer out of bed and ambulation.  Medication Inspection Compliance: Pill count conducted under aseptic conditions, in front of the patient. Neither the pills nor the bottle was removed from the patient's sight at any time. Once count was completed pills were immediately returned to the patient in their original bottle.  Medication: Hydrocodone/APAP Pill/Patch Count:  51.5 of 90 pills remain Pill/Patch Appearance: Markings consistent with prescribed medication Bottle Appearance: Standard pharmacy container. Clearly labeled. Filled Date: 06 / 01 / 2023 Last Medication intake:  Today    UDS:  Summary  Date Value Ref Range Status  06/01/2021 Note  Final    Comment:    ==================================================================== [employer] Select 13 (MW) ==================================================================== Test                             Result       Flag       Units  Drug Present and Declared for Prescription Verification   Hydrocodone                    255          EXPECTED   ng/mg creat   Dihydrocodeine                 113          EXPECTED   ng/mg creat   Norhydrocodone                 1235         EXPECTED  ng/mg creat    Sources of hydrocodone include scheduled prescription medications.    Dihydrocodeine and norhydrocodone are expected metabolites of    hydrocodone. Dihydrocodeine is also available as a scheduled    prescription  medication.  ==================================================================== Test                      Result    Flag   Units      Ref Range   Creatinine              97               mg/dL      >=13 ==================================================================== Declared Medications:  The flagging and interpretation on this report are based on the  following declared medications.  Unexpected results may arise from  inaccuracies in the declared medications.   **Note: The testing scope of this panel includes these medications:   Hydrocodone (Norco)   **Note: The testing scope of this panel does not include the  following reported medications:   Acetaminophen (Norco)  Amlodipine (Norvasc)  Aspirin  Calcium  Citalopram (Celexa)  Cyclobenzaprine (Flexeril)  Donepezil (Aricept)  Gabapentin (Neurontin)  Glucosamine  Hydrochlorothiazide (Hydrodiuril)  Letrozole (Femara)  Losartan (Cozaar)  Lubiprostone (Amitiza)  Magnesium (Mag-Ox)  Melatonin  Meloxicam (Mobic)  Metoprolol (Toprol)  Multivitamin  Omeprazole (Prilosec)  Pantoprazole (Protonix)  Pravastatin (Pravachol) ==================================================================== For clinical consultation, please call [phone]. ====================================================================      ROS  Constitutional: Denies any fever or chills Gastrointestinal: No reported hemesis, hematochezia, vomiting, or acute GI distress Musculoskeletal: Denies any acute onset joint swelling, redness, loss of ROM, or weakness Neurological: No reported episodes of acute onset apraxia, aphasia, dysarthria, agnosia, amnesia, paralysis, loss of coordination, or loss of consciousness  Medication Review  Glucosamine Sulfate, Glucosamine-Chondroitin, HYDROcodone-acetaminophen, Omega-3, amLODipine, aspirin EC, calcium carbonate, citalopram, cyclobenzaprine, donepezil, gabapentin, hydrochlorothiazide, letrozole,  losartan, lubiprostone, magnesium oxide, melatonin, meloxicam, metoprolol succinate, multivitamin, nitrofurantoin (macrocrystal-monohydrate), omeprazole, pantoprazole, and rosuvastatin  History Review  Allergy: Ms. Lopezrodriguez is allergic to sulfa antibiotics and vesicare [solifenacin]. Drug: Ms. Giovannoni  reports no history of drug use. Alcohol:  reports no history of alcohol use. Tobacco:  reports that she has never smoked. She has never used smokeless tobacco. Social: Ms. Danton  reports that she has never smoked. She has never used smokeless tobacco. She reports that she does not drink alcohol and does not use drugs. Medical:  has a past medical history of Acute postoperative pain (10/25/2017), Anemia, Arm pain (07/26/2015), Arthritis, Arthritis, degenerative (03/26/2014), Back pain (11/01/2013), Bladder infection (06/2018), Breast cancer (HCC), Breast cancer (HCC), CHEST PAIN (04/29/2010), Chronic cystitis, Cystocele (02/22/2013), Cystocele, midline (08/19/2013), Degeneration of intervertebral disc of lumbosacral region (03/26/2014), DYSPNEA (04/29/2010), Enthesopathy of hip (03/26/2014), GERD (gastroesophageal reflux disease), Hiatal hernia, HTN (hypertension), Hypokalemia (06/2018), Hyponatremia (06/2018), LBP (low back pain) (03/26/2014), Neck pain (11/01/2013), Parkinson disease (HCC), Pneumonia (06/2018), Sinusitis (02/07/2015), Skin lesions (07/12/2014), and Urinary incontinence. Surgical: Ms. Ditoro  has a past surgical history that includes Tonsillectomy and adenoidectomy (81); Vesicovaginal fistula closure w/ TAH (1983); Breast surgery (1986); Breast enhancement surgery (1987); Breast implant removal; Breast implant removal (Right, 08/29/2012); Mastectomy (08/2012); Abdominal hysterectomy; Colonoscopy with propofol (N/A, 09/13/2016); and Breast biopsy (2013). Family: family history includes Colon polyps in her father; Diabetes in her father; Parkinson's disease in her mother; Stroke in her father and  mother.  Laboratory Chemistry Profile   Renal Lab Results  Component Value Date   BUN 24 (H) 05/09/2022  CREATININE 1.15 (H) 05/09/2022   BCR 20 06/11/2020   GFR 29.45 (L) 03/17/2022   GFRAA >60 07/27/2020   GFRNONAA 47 (L) 05/09/2022    Hepatic Lab Results  Component Value Date   AST 27 03/07/2022   ALT 18 03/07/2022   ALBUMIN 4.3 03/07/2022   ALKPHOS 45 03/07/2022    Electrolytes Lab Results  Component Value Date   NA 135 05/09/2022   K 4.0 05/09/2022   CL 99 05/09/2022   CALCIUM 9.2 05/09/2022   MG 2.2 07/17/2018    Bone Lab Results  Component Value Date   25OHVITD1 42 07/17/2018   25OHVITD2 <1.0 07/17/2018   25OHVITD3 42 07/17/2018    Inflammation (CRP: Acute Phase) (ESR: Chronic Phase) Lab Results  Component Value Date   CRP 3 07/17/2018   ESRSEDRATE 27 07/17/2018         Note: Above Lab results reviewed.  Recent Imaging Review  DG PAIN CLINIC C-ARM 1-60 MIN NO REPORT Fluoro was used, but no Radiologist interpretation will be provided.  Please refer to "NOTES" tab for provider progress note. Note: Reviewed        Physical Exam  General appearance: Well nourished, well developed, and well hydrated. In no apparent acute distress Mental status: Alert, oriented x 3 (person, place, & time)       Respiratory: No evidence of acute respiratory distress Eyes: PERLA Vitals: BP (!) 106/50 (BP Location: Left Arm, Patient Position: Sitting, Cuff Size: Large)   Pulse (!) 52   Temp (!) 97.2 F (36.2 C) (Temporal)   Resp 16   Ht 5\' 4"  (1.626 m)   Wt 180 lb (81.6 kg)   LMP 12/18/1981   SpO2 100%   BMI 30.90 kg/m  BMI: Estimated body mass index is 30.9 kg/m as calculated from the following:   Height as of this encounter: 5\' 4"  (1.626 m).   Weight as of this encounter: 180 lb (81.6 kg). Ideal: Ideal body weight: 54.7 kg (120 lb 9.5 oz) Adjusted ideal body weight: 65.5 kg (144 lb 5.7 oz)  Assessment   Diagnosis Status  1. Chronic pain syndrome   2.  Chronic knee pain (Right)   3. Osteoarthritis of knee (Right)   4. Chronic shoulder pain (Right)   5. Unspecified injury of muscle(s) and tendon(s) of the rotator cuff of shoulder, sequela (Right)   6. Chronic low back pain (1ry area of Pain) (Bilateral) (R>L) w/o sciatica   7. Lumbar facet syndrome (Bilateral) (R>L)   8. Chronic lower extremity pain (2ry area of Pain) (Right)   9. Pharmacologic therapy   10. Chronic use of opiate for therapeutic purpose   11. Encounter for medication management   12. Encounter for chronic pain management    Controlled Controlled Controlled   Updated Problems: No problems updated.  Plan of Care  Problem-specific:  No problem-specific Assessment & Plan notes found for this encounter.  Ms. Arryanna Watton has a current medication list which includes the following long-term medication(s): amlodipine, calcium carbonate, citalopram, cyclobenzaprine, donepezil, donepezil, gabapentin, hydrochlorothiazide, [START ON 06/26/2022] hydrocodone-acetaminophen, [START ON 07/26/2022] hydrocodone-acetaminophen, [START ON 08/25/2022] hydrocodone-acetaminophen, losartan, magnesium oxide, meloxicam, metoprolol succinate, omeprazole, pantoprazole, and rosuvastatin.  Pharmacotherapy (Medications Ordered): Meds ordered this encounter  Medications   HYDROcodone-acetaminophen (NORCO/VICODIN) 5-325 MG tablet    Sig: Take 1 tablet by mouth 3 (three) times daily. Must last 30 days.    Dispense:  90 tablet    Refill:  0    DO NOT: delete (not  duplicate); no partial-fill (will deny script to complete), no refill request (F/U required). DISPENSE: 1 day early if closed on fill date. WARN: No CNS-depressants within 8 hrs of med.   HYDROcodone-acetaminophen (NORCO/VICODIN) 5-325 MG tablet    Sig: Take 1 tablet by mouth 3 (three) times daily. Must last 30 days.    Dispense:  90 tablet    Refill:  0    DO NOT: delete (not duplicate); no partial-fill (will deny script to complete), no  refill request (F/U required). DISPENSE: 1 day early if closed on fill date. WARN: No CNS-depressants within 8 hrs of med.   HYDROcodone-acetaminophen (NORCO/VICODIN) 5-325 MG tablet    Sig: Take 1 tablet by mouth 3 (three) times daily. Must last 30 days.    Dispense:  90 tablet    Refill:  0    DO NOT: delete (not duplicate); no partial-fill (will deny script to complete), no refill request (F/U required). DISPENSE: 1 day early if closed on fill date. WARN: No CNS-depressants within 8 hrs of med.   pentafluoroprop-tetrafluoroeth (GEBAUERS) aerosol   lidocaine (PF) (XYLOCAINE) 1 % injection 5 mL   ropivacaine (PF) 2 mg/mL (0.2%) (NAROPIN) injection 5 mL   methylPREDNISolone acetate (DEPO-MEDROL) injection 80 mg   Orders:  Orders Placed This Encounter  Procedures   KNEE INJECTION    Local Anesthetic & Steroid injection.    Scheduling Instructions:     Side(s): Right Knee     Sedation: None     Timeframe: Today    Order Specific Question:   Where will this procedure be performed?    Answer:   [facility]   [facility] Select 13 (MW), Urine    Volume: 30 ml(s). Minimum 3 ml of urine is needed. Document temperature of fresh sample. Indications: Long term (current) use of opiate analgesic [phone])    Order Specific Question:   Release to patient    Answer:   Immediate   Informed Consent Details: Physician/Practitioner Attestation; Transcribe to consent form and obtain patient signature    Note: Always confirm laterality of pain with [name], before procedure. Transcribe to consent form and obtain patient signature.    Order Specific Question:   Physician/Practitioner attestation of informed consent for procedure/surgical case    Answer:   I, the physician/practitioner, attest that I have discussed with the patient the benefits, risks, side effects, alternatives, likelihood of achieving goals and potential problems during recovery for the procedure that I have provided informed  consent.    Order Specific Question:   Procedure    Answer:   Right-sided intra-articular knee arthrocentesis (aspiration and/or injection)    Order Specific Question:   Physician/Practitioner performing the procedure    Answer:   Leatha Rohner A. Laban Emperor, MD    Order Specific Question:   Indication/Reason    Answer:   Chronic right-sided knee pain secondary to knee arthropathy/arthralgia   Provide equipment / supplies at bedside    "Block Tray" (Disposable  single use) Needle type: SpinalRegular Amount/quantity: 1 Size: Short(1.5-inch) Gauge: 25G    Standing Status:   Standing    Number of Occurrences:   1    Order Specific Question:   Specify    Answer:   Block Tray   Follow-up plan:   Return in about 2 weeks (around 06/26/2022) for Proc-day (T,Th), (F2F), (PPE).     Interventional Therapies  Risk  Complexity Considerations:   Estimated body mass index is 30.73 kg/m as calculated from the following:  Height as of this encounter: 5\' 4"  (1.626 m).   Weight as of this encounter: 179 lb (81.2 kg). WNL   Planned  Pending:   Therapeutic right IA steroid knee inj. #3 (06/12/2022)    Under consideration:   Diagnostic right genicular NB  Diagnostic bilateral cervical facet MBB    Completed:   Therapeutic right superior/lateral trapezius muscle TPI x1 (04/19/2021)  Therapeutic right thoracic back TPI x2 (10/17/2018)  (DKFUA)  Therapeutic left thoracic back TPI x1 (10/17/2018)  (DKFUA)  Therapeutic right quadratus lumborum and erector spinae muscle TPI x1 (02/12/2018)  Therapeutic bilateral lumbar facet MBB x5 (08/09/2021) (DKFUA)  Therapeutic right lumbar facet MBB x4 (12/21/2020) (DKFUA)  Therapeutic right lumbar facet RFA x2 (06/04/2018) (100/90/75/75-100)  Therapeutic left lumbar facet RFA x1 (02/12/2018) (100/60/90/>75)  Diagnostic right SI joint Blk x3 (12/21/2020)  (DKFUA)  Diagnostic left SI joint Blk x1 (08/09/2017) (100/90/20/<25)  Therapeutic right SI joint RFA x3  (06/05/2019) (100/100/85/>50)  Therapeutic midline L2-3 LESI x1 (10/04/2017) (100/100/85/>50)  Therapeutic right L3-4 LESI x2 (11/04/2020) (100/100/20/<50)  Therapeutic right L4-5 LESI x2 (03/30/2022) (100/100/75/75)  Therapeutic right L5-S1 LESI x2 (12/04/2019) (100/100/50/75)  Therapeutic right L5 TFESI x4 (02/14/2022) (100/100/70/75)  Therapeutic right S1 TFESI x1 (11/04/2020) (100/100/20/<50)  Diagnostic bilateral superficial trochanteric bursa injec. x1 (04/30/2018) (DKFUA)  Diagnostic right IA hip injection x1 (02/13/2019) (DKFUA)  Diagnostic/therapeutic right (steroid) knee injection x1 (04/19/2021) (100/100/50/50)  Therapeutic right (Zilretta) knee injection x1 (10/06/2021)  (DKFUA)  Therapeutic right (Monovisc) knee injection x1 (06/07/2021) (100/100/100/75)    Therapeutic  Palliative (PRN) options:   Therapeutic/palliative lumbar facet RFA  Therapeutic/palliative lumbar facet MBB  Therapeutic left L2 TFESI (this level is responsible for her low back pain and left lower extremity pain.)    Recent Visits Date Type Provider Dept  05/30/22 Procedure visit [name], MD [facility]  04/18/22 Office Visit [name], MD [facility]  03/30/22 Procedure visit [name], MD [facility]  03/20/22 Office Visit [name], MD [facility]  Showing recent visits within past 90 days and meeting all other requirements Today's Visits Date Type Provider Dept  06/12/22 Office Visit [name], MD [facility]  Showing today's visits and meeting all other requirements Future Appointments Date Type Provider Dept  06/15/22 Appointment [name], MD [facility]  06/26/22 Appointment [name], MD [facility]  Showing future appointments within next 90 days and meeting all other requirements  I discussed the assessment and treatment plan with the patient. The patient was  provided an opportunity to ask questions and all were answered. The patient agreed with the plan and demonstrated an understanding of the instructions.  Patient advised to call back or seek an in-person evaluation if the symptoms or condition worsens.  Duration of encounter: 30 minutes.  Total time on encounter, as per AMA guidelines included both the face-to-face and non-face-to-face time personally spent by the physician and/or other qualified health care professional(s) on the day of the encounter (includes time in activities that require the physician or other qualified health care professional and does not include time in activities normally performed by clinical staff). Physician's time may include the following activities when performed: preparing to see the patient (eg, review of tests, pre-charting review of records) obtaining and/or reviewing separately obtained history performing a medically appropriate examination and/or evaluation counseling and educating the patient/family/caregiver ordering medications, tests, or procedures referring and communicating with other health care professionals (when not separately reported) documenting clinical  information in the electronic or other health record independently interpreting results (not separately reported) and communicating results to the patient/ family/caregiver care coordination (not separately reported)  Note by: Oswaldo Done, MD Date: 06/12/2022; Time: 2:54 PM

## 2022-06-12 ENCOUNTER — Ambulatory Visit: Payer: Medicare Other | Attending: Pain Medicine | Admitting: Pain Medicine

## 2022-06-12 ENCOUNTER — Encounter: Payer: Self-pay | Admitting: Pain Medicine

## 2022-06-12 ENCOUNTER — Other Ambulatory Visit: Payer: Self-pay

## 2022-06-12 VITALS — BP 106/50 | HR 52 | Temp 97.2°F | Resp 16 | Ht 64.0 in | Wt 180.0 lb

## 2022-06-12 DIAGNOSIS — L570 Actinic keratosis: Secondary | ICD-10-CM | POA: Diagnosis not present

## 2022-06-12 DIAGNOSIS — Z79891 Long term (current) use of opiate analgesic: Secondary | ICD-10-CM | POA: Insufficient documentation

## 2022-06-12 DIAGNOSIS — Z79899 Other long term (current) drug therapy: Secondary | ICD-10-CM | POA: Diagnosis not present

## 2022-06-12 DIAGNOSIS — L821 Other seborrheic keratosis: Secondary | ICD-10-CM | POA: Diagnosis not present

## 2022-06-12 DIAGNOSIS — M1711 Unilateral primary osteoarthritis, right knee: Secondary | ICD-10-CM | POA: Diagnosis not present

## 2022-06-12 DIAGNOSIS — M545 Low back pain, unspecified: Secondary | ICD-10-CM | POA: Diagnosis not present

## 2022-06-12 DIAGNOSIS — M25561 Pain in right knee: Secondary | ICD-10-CM | POA: Diagnosis not present

## 2022-06-12 DIAGNOSIS — M47816 Spondylosis without myelopathy or radiculopathy, lumbar region: Secondary | ICD-10-CM | POA: Diagnosis not present

## 2022-06-12 DIAGNOSIS — G8929 Other chronic pain: Secondary | ICD-10-CM | POA: Diagnosis not present

## 2022-06-12 DIAGNOSIS — M79604 Pain in right leg: Secondary | ICD-10-CM | POA: Diagnosis not present

## 2022-06-12 DIAGNOSIS — M25511 Pain in right shoulder: Secondary | ICD-10-CM | POA: Diagnosis not present

## 2022-06-12 DIAGNOSIS — L631 Alopecia universalis: Secondary | ICD-10-CM | POA: Diagnosis not present

## 2022-06-12 DIAGNOSIS — G894 Chronic pain syndrome: Secondary | ICD-10-CM | POA: Insufficient documentation

## 2022-06-12 DIAGNOSIS — S46001S Unspecified injury of muscle(s) and tendon(s) of the rotator cuff of right shoulder, sequela: Secondary | ICD-10-CM | POA: Diagnosis not present

## 2022-06-12 DIAGNOSIS — L638 Other alopecia areata: Secondary | ICD-10-CM | POA: Diagnosis not present

## 2022-06-12 MED ORDER — LIDOCAINE HCL (PF) 1 % IJ SOLN
5.0000 mL | Freq: Once | INTRAMUSCULAR | Status: AC
  Administered 2022-06-12: 5 mL
  Filled 2022-06-12: qty 5

## 2022-06-12 MED ORDER — PENTAFLUOROPROP-TETRAFLUOROETH EX AERO: INHALATION_SPRAY | Freq: Once | CUTANEOUS | Status: DC

## 2022-06-12 MED ORDER — HYDROCODONE-ACETAMINOPHEN 5-325 MG PO TABS: 1.0000 | ORAL_TABLET | Freq: Three times a day (TID) | ORAL | 0 refills | Status: DC

## 2022-06-12 MED ORDER — ROPIVACAINE HCL 2 MG/ML IJ SOLN
5.0000 mL | Freq: Once | INTRAMUSCULAR | Status: AC
  Administered 2022-06-12: 5 mL via INTRA_ARTICULAR
  Filled 2022-06-12: qty 20

## 2022-06-12 MED ORDER — METHYLPREDNISOLONE ACETATE 80 MG/ML IJ SUSP
80.0000 mg | Freq: Once | INTRAMUSCULAR | Status: AC
  Administered 2022-06-12: 80 mg via INTRA_ARTICULAR
  Filled 2022-06-12: qty 1

## 2022-06-12 NOTE — Progress Notes (Deleted)
PROVIDER NOTE: Information contained herein reflects review and annotations entered in association with encounter. Interpretation of such information and data should be left to medically-trained personnel. Information provided to patient can be located elsewhere in the medical record under "Patient Instructions". Document created using STT-dictation technology, any transcriptional errors that may result from process are unintentional.    Patient: Wanda Martinez  Service Category: E/M  Provider: Gaspar Cola, MD  DOB: 09/24/1935  DOS: 06/15/2022  Specialty: Interventional Pain Management  MRN: [id number]  Setting: Ambulatory outpatient  PCP: Einar Pheasant, MD  Type: Established Patient    Referring Provider: Einar Pheasant, MD  Location: Office  Delivery: Face-to-face     HPI  Ms. Gladys Orlandria Kissner, a 87 y.o. year old female, is here today because of her No primary diagnosis found.. Ms. Ericson primary complain today is No chief complaint on file. Last encounter: My last encounter with her was on 06/12/2022. Pertinent problems: Ms. Chakraborty has Headache; Parkinson's disease ([place]); Chronic low back pain (1ry area of Pain) (Bilateral) (R>L) w/o sciatica; Lumbar spondylosis; Chronic hip pain (Bilateral); Chronic neck pain; Cervical spondylosis; Chronic cervical radicular pain (Right); Diffuse myofascial pain syndrome; Neurogenic pain; Chronic upper back pain (Right); Myofascial pain syndrome (Right) (cervicothoracic); Lumbar facet syndrome (Bilateral) (R>L); History of breast cancer; Cervical facet hypertrophy; Cervical facet syndrome ([place]); Chronic shoulder pain (Right); Chronic pain syndrome; Chronic sacroiliac joint pain (Left); Chronic sacroiliac joint pain (Right); 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, L4-5, L5-S1) (Right); Lumbar spinal stenosis (with neurogenic claudication) (L3-4); Chronic lower extremity pain (2ry area of Pain) (Right); Chronic lumbar radicular pain (S1) (Right);  Trochanteric bursitis of hip (Bilateral); Spondylosis without myelopathy or radiculopathy, lumbosacral region; Trigger point with back pain (Right); DDD (degenerative disc disease), lumbosacral; Chronic upper extremity pain (Right); Chronic thoracic back pain (Bilateral) (L>R); Trigger point of thoracic region (Bilateral) (L>R); Other specified dorsopathies, sacral and sacrococcygeal region; Sacroiliac joint dysfunction (Right); Osteoarthritis of sacroiliac joint (Right); Somatic dysfunction of sacroiliac joint (Right); Chronic musculoskeletal pain; Facial pain; Chronic hip pain (Right); Osteoarthritis of hip (Right); Left ear pain; Abnormal MRI, lumbar spine (12/30/2019); Osteoarthritis involving multiple joints; Other spondylosis, sacral and sacrococcygeal region; Lumbar facet hypertrophy (Multilevel) (Bilateral); Chronic knee pain (Right); Osteoarthritis of knee (Right); Trigger point of shoulder region (Right); Osteoarthritis of AC (acromioclavicular) joint (Right); Osteoarthritis of shoulder (Right); Unspecified injury of muscle(s) and tendon(s) of the rotator cuff of shoulder, sequela (Right); Cervicalgia; DDD (degenerative disc disease), lumbar; Lumbar radicular pain (Bilateral); Lumbar foraminal stenosis (Right: L3-4, L4-5, L5-S1) (Left: L2-3); and Abnormal MRI, hip (Right) (05/04/2022) on their pertinent problem list. Pain Assessment: Severity of   is reported as a  /10. Location:    / . Onset:  . Quality:  . Timing:  . Modifying factor(s):  Marland Kitchen Vitals:  vitals were not taken for this visit.   Reason for encounter:  *** . ***  Pharmacotherapy Assessment  Analgesic: Hydrocodone/APAP 5/325, 1 tab PO q 8 hrs (15 mg/day of hydrocodone) MME/day: 15 mg/day.   Monitoring: [place] PMP: PDMP reviewed during this encounter.       Pharmacotherapy: No side-effects or adverse reactions reported. Compliance: No problems identified. Effectiveness: Clinically acceptable.  No notes on file  UDS:  Summary  Date  Value Ref Range Status  06/01/2021 Note  Final    Comment:    ==================================================================== [facility] (MW) ==================================================================== Test  Result       Flag       Units  Drug Present and Declared for Prescription Verification   Hydrocodone                    255          EXPECTED   ng/mg creat   Dihydrocodeine                 113          EXPECTED   ng/mg creat   Norhydrocodone                 1235         EXPECTED   ng/mg creat    Sources of hydrocodone include scheduled prescription medications.    Dihydrocodeine and norhydrocodone are expected metabolites of    hydrocodone. Dihydrocodeine is also available as a scheduled    prescription medication.  ==================================================================== Test                      Result    Flag   Units      Ref Range   Creatinine              97               mg/dL      >=20 ==================================================================== Declared Medications:  The flagging and interpretation on this report are based on the  following declared medications.  Unexpected results may arise from  inaccuracies in the declared medications.   **Note: The testing scope of this panel includes these medications:   Hydrocodone (Norco)   **Note: The testing scope of this panel does not include the  following reported medications:   Acetaminophen (Norco)  Amlodipine (Norvasc)  Aspirin  Calcium  Citalopram (Celexa)  Cyclobenzaprine (Flexeril)  Donepezil (Aricept)  Gabapentin (Neurontin)  Glucosamine  Hydrochlorothiazide (Hydrodiuril)  Letrozole (Femara)  Losartan (Cozaar)  Lubiprostone (Amitiza)  Magnesium (Mag-Ox)  Melatonin  Meloxicam (Mobic)  Metoprolol (Toprol)  Multivitamin  Omeprazole (Prilosec)  Pantoprazole (Protonix)  Pravastatin  (Pravachol) ==================================================================== For clinical consultation, please call [phone]. ====================================================================      ROS  Constitutional: Denies any fever or chills Gastrointestinal: No reported hemesis, hematochezia, vomiting, or acute GI distress Musculoskeletal: Denies any acute onset joint swelling, redness, loss of ROM, or weakness Neurological: No reported episodes of acute onset apraxia, aphasia, dysarthria, agnosia, amnesia, paralysis, loss of coordination, or loss of consciousness  Medication Review  Glucosamine Sulfate, Glucosamine-Chondroitin, HYDROcodone-acetaminophen, Omega-3, amLODipine, aspirin EC, calcium carbonate, citalopram, cyclobenzaprine, donepezil, gabapentin, hydrochlorothiazide, letrozole, losartan, lubiprostone, magnesium oxide, melatonin, meloxicam, metoprolol succinate, multivitamin, nitrofurantoin (macrocrystal-monohydrate), omeprazole, pantoprazole, and rosuvastatin  History Review  Allergy: Ms. Rosenfield is allergic to sulfa antibiotics and vesicare [solifenacin]. Drug: Ms. Biederman  reports no history of drug use. Alcohol:  reports no history of alcohol use. Tobacco:  reports that she has never smoked. She has never used smokeless tobacco. Social: Ms. Shepheard  reports that she has never smoked. She has never used smokeless tobacco. She reports that she does not drink alcohol and does not use drugs. Medical:  has a past medical history of Acute postoperative pain (10/25/2017), Anemia, Arm pain (07/26/2015), Arthritis, Arthritis, degenerative (03/26/2014), Back pain (11/01/2013), Bladder infection (06/2018), Breast cancer ([place]), Breast cancer ([place]), CHEST PAIN (04/29/2010), Chronic cystitis, Cystocele (02/22/2013), Cystocele, midline (08/19/2013), Degeneration of intervertebral disc of lumbosacral region (03/26/2014), DYSPNEA (04/29/2010), Enthesopathy of hip (03/26/2014), GERD  (gastroesophageal reflux  disease), Hiatal hernia, HTN (hypertension), Hypokalemia (06/2018), Hyponatremia (06/2018), LBP (low back pain) (03/26/2014), Neck pain (11/01/2013), Parkinson disease ([facility]), Pneumonia (06/2018), Sinusitis (02/07/2015), Skin lesions (07/12/2014), and Urinary incontinence. Surgical: Ms. Island  has a past surgical history that includes Tonsillectomy and adenoidectomy (81); Vesicovaginal fistula closure w/ TAH (1983); Breast surgery (1986); Breast enhancement surgery (1987); Breast implant removal; Breast implant removal (Right, 08/29/2012); Mastectomy (08/2012); Abdominal hysterectomy; Colonoscopy with propofol (N/A, 09/13/2016); and Breast biopsy (2013). Family: family history includes Colon polyps in her father; Diabetes in her father; Parkinson's disease in her mother; Stroke in her father and mother.  Laboratory Chemistry Profile   Renal Lab Results  Component Value Date   BUN 24 (H) 05/09/2022   CREATININE 1.15 (H) 05/09/2022   BCR 20 06/11/2020   GFR 29.45 (L) 03/17/2022   GFRAA >60 07/27/2020   GFRNONAA 47 (L) 05/09/2022    Hepatic Lab Results  Component Value Date   AST 27 03/07/2022   ALT 18 03/07/2022   ALBUMIN 4.3 03/07/2022   ALKPHOS 45 03/07/2022    Electrolytes Lab Results  Component Value Date   NA 135 05/09/2022   K 4.0 05/09/2022   CL 99 05/09/2022   CALCIUM 9.2 05/09/2022   MG 2.2 07/17/2018    Bone Lab Results  Component Value Date   25OHVITD1 42 07/17/2018   25OHVITD2 <1.0 07/17/2018   25OHVITD3 42 07/17/2018    Inflammation (CRP: Acute Phase) (ESR: Chronic Phase) Lab Results  Component Value Date   CRP 3 07/17/2018   ESRSEDRATE 27 07/17/2018         Note: Above Lab results reviewed.  Recent Imaging Review  DG PAIN CLINIC C-ARM 1-60 MIN NO REPORT Fluoro was used, but no Radiologist interpretation will be provided.  Please refer to "NOTES" tab for provider progress note. Note: Reviewed        Physical Exam  General appearance:  Well nourished, well developed, and well hydrated. In no apparent acute distress Mental status: Alert, oriented x 3 (person, place, & time)       Respiratory: No evidence of acute respiratory distress Eyes: PERLA Vitals: LMP 12/18/1981  BMI: Estimated body mass index is 30.9 kg/m as calculated from the following:   Height as of 06/12/22: 5\' 4"  (1.626 m).   Weight as of 06/12/22: 180 lb (81.6 kg). Ideal: Ideal body weight: 54.7 kg (120 lb 9.5 oz) Adjusted ideal body weight: 65.5 kg (144 lb 5.7 oz)  Assessment   Diagnosis Status  No diagnosis found. Controlled Controlled Controlled   Updated Problems: Problem  Abnormal MRI, lumbar spine (12/30/2019)   FINDINGS: Alignment:  Curvature convex to the right with the apex at L2. Schmorl's node on the left at L2 with some edema.   Levels: T11-12, T12-L1 and L1-2: Mild noncompressive disc bulges. L2-3: Disc degeneration more pronounced on the left. Foraminal narrowing on the left that would have some potential to affect the exiting L2 nerve. This has worsened slightly since 2018. L3-4: Endplate osteophytes and bulging of the disc. Mild facet hypertrophy. Mild stenosis of the right lateral recess but without gross neural compression. L4-5: Endplate osteophytes and bulging of the disc. Facet degeneration and hypertrophy on the right. Mild narrowing of the intervertebral foramen on the right, not grossly compressive. L5-S1: Bulging of the disc more prominent towards the right. Facet degeneration and hypertrophy more pronounced on the right. Foraminal narrowing on the right that could possibly affect the exiting L5 nerve. This has worsened slightly since 2018.  IMPRESSION: 1. Curvature convex to the right with the apex at L2 as seen previously. 2. Slight worsening of disc degeneration on the left side at L2-3. L2 Schmorl's node with edema that could contribute to back pain. Left foraminal encroachment secondary to osteophytes and bulging disc  material would have some potential to affect the exiting L2 nerve. 3. Slight worsening of degenerative changes on the right at L5-S1. Bulging of the disc. Facet degeneration and hypertrophy. Right foraminal narrowing that could possibly affect the right L5 nerve. The facet arthropathy could also be a cause of back pain or referred facet syndrome pain. Lesser facet osteoarthritis on the left.     Plan of Care  Problem-specific:  No problem-specific Assessment & Plan notes found for this encounter.  Ms. Kemani Heidel has a current medication list which includes the following long-term medication(s): amlodipine, calcium carbonate, citalopram, cyclobenzaprine, donepezil, donepezil, gabapentin, hydrochlorothiazide, [START ON 06/26/2022] hydrocodone-acetaminophen, [START ON 07/26/2022] hydrocodone-acetaminophen, [START ON 08/25/2022] hydrocodone-acetaminophen, losartan, magnesium oxide, meloxicam, metoprolol succinate, omeprazole, pantoprazole, and rosuvastatin.  Pharmacotherapy (Medications Ordered): No orders of the defined types were placed in this encounter.  Orders:  No orders of the defined types were placed in this encounter.  Follow-up plan:   No follow-ups on file.     Interventional Therapies  Risk  Complexity Considerations:   Estimated body mass index is 30.73 kg/m as calculated from the following:   Height as of this encounter: $RemoveBeforeD'5\' 4"'[id number]$  (1.626 m).   Weight as of this encounter: 179 lb (81.2 kg). WNL   Planned  Pending:   Therapeutic right IA steroid knee inj. #3 (06/12/2022)    Under consideration:   Diagnostic right genicular NB  Diagnostic bilateral cervical facet MBB    Completed:   Therapeutic right superior/lateral trapezius muscle TPI x1 (04/19/2021)  Therapeutic right thoracic back TPI x2 (10/17/2018)  (DKFUA)  Therapeutic left thoracic back TPI x1 (10/17/2018)  (DKFUA)  Therapeutic right quadratus lumborum and erector spinae muscle TPI x1 (02/12/2018)  Therapeutic  bilateral lumbar facet MBB x5 (08/09/2021) (DKFUA)  Therapeutic right lumbar facet MBB x4 (12/21/2020) (DKFUA)  Therapeutic right lumbar facet RFA x2 (06/04/2018) (100/90/75/75-100)  Therapeutic left lumbar facet RFA x1 (02/12/2018) (100/60/90/>75)  Diagnostic right SI joint Blk x3 (12/21/2020)  (DKFUA)  Diagnostic left SI joint Blk x1 (08/09/2017) (100/90/20/<25)  Therapeutic right SI joint RFA x3 (06/05/2019) (100/100/85/>50)  Therapeutic midline L2-3 LESI x1 (10/04/2017) (100/100/85/>50)  Therapeutic right L3-4 LESI x2 (11/04/2020) (100/100/20/<50)  Therapeutic right L4-5 LESI x2 (03/30/2022) (100/100/75/75)  Therapeutic right L5-S1 LESI x2 (12/04/2019) (100/100/50/75)  Therapeutic right L5 TFESI x4 (02/14/2022) (100/100/70/75)  Therapeutic right S1 TFESI x1 (11/04/2020) (100/100/20/<50)  Diagnostic bilateral superficial trochanteric bursa injec. x1 (04/30/2018) (DKFUA)  Diagnostic right IA hip injection x1 (02/13/2019) (DKFUA)  Diagnostic/therapeutic right (steroid) knee injection x1 (04/19/2021) (100/100/50/50)  Therapeutic right ([place]) knee injection x1 (10/06/2021)  (DKFUA)  Therapeutic right (Monovisc) knee injection x1 (06/07/2021) (100/100/100/75)    Therapeutic  Palliative (PRN) options:   Therapeutic/palliative lumbar facet RFA  Therapeutic/palliative lumbar facet MBB  Therapeutic left L2 TFESI (this level is responsible for her low back pain and left lower extremity pain.)     Recent Visits Date Type Provider Dept  05/30/22 Procedure visit Milinda Pointer, MD [facility]  04/18/22 Office Visit Milinda Pointer, MD [facility]  03/30/22 Procedure visit Milinda Pointer, MD [facility]  03/20/22 Office Visit Milinda Pointer, MD [facility]  Showing recent visits within past 90 days and meeting  all other requirements Today's Visits Date Type Provider Dept  06/12/22 Office Visit Milinda Pointer, MD [facility]   Showing today's visits and meeting all other requirements Future Appointments Date Type Provider Dept  06/15/22 Appointment Milinda Pointer, [facility]  06/26/22 Appointment Milinda Pointer, MD [facility]  Showing future appointments within next 90 days and meeting all other requirements  I discussed the assessment and treatment plan with the patient. The patient was provided an opportunity to ask questions and all were answered. The patient agreed with the plan and demonstrated an understanding of the instructions.  Patient advised to call back or seek an in-person evaluation if the symptoms or condition worsens.  Duration of encounter: *** minutes.  Total time on encounter, as per AMA guidelines included both the face-to-face and non-face-to-face time personally spent by the physician and/or other qualified health care professional(s) on the day of the encounter (includes time in activities that require the physician or other qualified health care professional and does not include time in activities normally performed by clinical staff). Physician's time may include the following activities when performed: preparing to see the patient (eg, review of tests, pre-charting review of records) obtaining and/or reviewing separately obtained history performing a medically appropriate examination and/or evaluation counseling and educating the patient/family/caregiver ordering medications, tests, or procedures referring and communicating with other health care professionals (when not separately reported) documenting clinical information in the electronic or other health record independently interpreting results (not separately reported) and communicating results to the patient/ family/caregiver care coordination (not separately reported)  Note by: Gaspar Cola, MD Date: 06/15/2022; Time: 6:22 PM

## 2022-06-14 ENCOUNTER — Telehealth: Payer: Self-pay

## 2022-06-15 ENCOUNTER — Ambulatory Visit: Payer: Medicare Other | Admitting: Pain Medicine

## 2022-06-16 LAB — TOXASSURE SELECT 13 (MW), URINE

## 2022-06-25 NOTE — Progress Notes (Unsigned)
PROVIDER NOTE: Information contained herein reflects review and annotations entered in association with encounter. Interpretation of such information and data should be left to medically-trained personnel. Information provided to patient can be located elsewhere in the medical record under "Patient Instructions". Document created using STT-dictation technology, any transcriptional errors that may result from process are unintentional.    Patient: Wanda Martinez  Service Category: E/M  Provider: Gaspar Cola, MD  DOB: Dec 10, 1935  DOS: 06/26/2022  Specialty: Interventional Pain Management  MRN: [id number]  Setting: Ambulatory outpatient  PCP: Einar Pheasant, MD  Type: Established Patient    Referring Provider: Einar Pheasant, MD  Location: Office  Delivery: Face-to-face     HPI  [name], a 87 y.o. year old female, is here today because of her No primary diagnosis found.. [name] primary complain today is No chief complaint on file. Last encounter: My last encounter with her was on 06/12/2022. Pertinent problems: [name] has Headache; Parkinson's disease ([place]); Chronic low back pain (1ry area of Pain) (Bilateral) (R>L) w/o sciatica; Lumbar spondylosis; Chronic hip pain (Bilateral); Chronic neck pain; Cervical spondylosis; Chronic cervical radicular pain (Right); Diffuse myofascial pain syndrome; Neurogenic pain; Chronic upper back pain (Right); Myofascial pain syndrome (Right) (cervicothoracic); Lumbar facet syndrome (Bilateral) (R>L); History of breast cancer; Cervical facet hypertrophy; Cervical facet syndrome ([place]); Chronic shoulder pain (Right); Chronic pain syndrome; Chronic sacroiliac joint pain (Left); Chronic sacroiliac joint pain (Right); 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, L4-5, L5-S1) (Right); Lumbar spinal stenosis (with neurogenic claudication) (L3-4); Chronic lower extremity pain (2ry area of Pain) (Right); Chronic lumbar radicular pain (S1) (Right);  Trochanteric bursitis of hip (Bilateral); Spondylosis without myelopathy or radiculopathy, lumbosacral region; Trigger point with back pain (Right); DDD (degenerative disc disease), lumbosacral; Chronic upper extremity pain (Right); Chronic thoracic back pain (Bilateral) (L>R); Trigger point of thoracic region (Bilateral) (L>R); Other specified dorsopathies, sacral and sacrococcygeal region; Sacroiliac joint dysfunction (Right); Osteoarthritis of sacroiliac joint (Right); Somatic dysfunction of sacroiliac joint (Right); Chronic musculoskeletal pain; Facial pain; Chronic hip pain (Right); Osteoarthritis of hip (Right); Left ear pain; Abnormal MRI, lumbar spine (12/30/2019); Osteoarthritis involving multiple joints; Other spondylosis, sacral and sacrococcygeal region; Lumbar facet hypertrophy (Multilevel) (Bilateral); Chronic knee pain (Right); Osteoarthritis of knee (Right); Trigger point of shoulder region (Right); Osteoarthritis of AC (acromioclavicular) joint (Right); Osteoarthritis of shoulder (Right); Unspecified injury of muscle(s) and tendon(s) of the rotator cuff of shoulder, sequela (Right); Cervicalgia; DDD (degenerative disc disease), lumbar; Lumbar radicular pain (Bilateral); Lumbar foraminal stenosis (Right: L3-4, L4-5, L5-S1) (Left: L2-3); and Abnormal MRI, hip (Right) (05/04/2022) on their pertinent problem list. Pain Assessment: Severity of   is reported as a  /10. Location:    / . Onset:  . Quality:  . Timing:  . Modifying factor(s):  Marland Kitchen Vitals:  vitals were not taken for this visit.   Reason for encounter: post-procedure evaluation and assessment. ***  Post-procedure evaluation   Type: Steroid Intra-articular Knee Injection Laterality: Right (-RT) No.: 3  Series: n/a Level/approach: Lateral Imaging guidance: None required (CPT-20610) Anesthesia: Local anesthesia (1-2% Lidocaine) Anxiolysis: None                 Sedation: None. DOS: 06/12/2022  Performed by: Gaspar Cola,  MD  Purpose: Diagnostic/Therapeutic Indications: Knee arthralgia associated to osteoarthritis of the knee 1. Chronic knee pain (Right)   2. Osteoarthritis of knee (Right)    NAS-11 score:   Pre-procedure: 7 /10   Post-procedure: 7 /10     Effectiveness:  Initial  hour after procedure:   ***. Subsequent 4-6 hours post-procedure:   ***. Analgesia past initial 6 hours:   ***. Ongoing improvement:  Analgesic:  *** Function:    ***    ROM:    ***      Pharmacotherapy Assessment  Analgesic: Hydrocodone/APAP 5/325, 1 tab PO q 8 hrs (15 mg/day of hydrocodone) MME/day: 15 mg/day.   Monitoring: [place] PMP: PDMP reviewed during this encounter.       Pharmacotherapy: No side-effects or adverse reactions reported. Compliance: No problems identified. Effectiveness: Clinically acceptable.  No notes on file  UDS:  Summary  Date Value Ref Range Status  06/12/2022 Note  Final    Comment:    ==================================================================== [employer] [facility] (MW) ==================================================================== Test                             Result       Flag       Units  Drug Present and Declared for Prescription Verification   Hydrocodone                    1766         EXPECTED   ng/mg creat   Dihydrocodeine                 303          EXPECTED   ng/mg creat   Norhydrocodone                 1597         EXPECTED   ng/mg creat    Sources of hydrocodone include scheduled prescription medications.    Dihydrocodeine and norhydrocodone are expected metabolites of    hydrocodone. Dihydrocodeine is also available as a scheduled    prescription medication.  ==================================================================== Test                      Result    Flag   Units      Ref Range   Creatinine              38               mg/dL      >=20 ==================================================================== Declared Medications:  The  flagging and interpretation on this report are based on the  following declared medications.  Unexpected results may arise from  inaccuracies in the declared medications.   **Note: The testing scope of this panel includes these medications:   Hydrocodone (Norco)   **Note: The testing scope of this panel does not include the  following reported medications:   Acetaminophen (Norco)  Amlodipine  Aspirin  Calcium  Chondroitin  Citalopram (Celexa)  Cyclobenzaprine (Flexeril)  Donepezil (Aricept)  Gabapentin (Neurontin)  Glucosamine  Letrozole (Femara)  Losartan (Cozaar)  Lubiprostone (Amitiza)  Magnesium (Mag-Ox)  Melatonin  Meloxicam (Mobic)  Metoprolol  Multivitamin  Nitrofurantoin (Macrobid)  Omega-3 Fatty Acids  Omeprazole  Pantoprazole (Protonix)  Rosuvastatin (Crestor) ==================================================================== For clinical consultation, please call [phone]. ====================================================================      ROS  Constitutional: Denies any fever or chills Gastrointestinal: No reported hemesis, hematochezia, vomiting, or acute GI distress Musculoskeletal: Denies any acute onset joint swelling, redness, loss of ROM, or weakness Neurological: No reported episodes of acute onset apraxia, aphasia, dysarthria, agnosia, amnesia, paralysis, loss of coordination, or loss of consciousness  Medication Review  Glucosamine Sulfate, Glucosamine-Chondroitin, HYDROcodone-acetaminophen, Omega-3, amLODipine, aspirin EC, calcium  carbonate, citalopram, cyclobenzaprine, donepezil, gabapentin, hydrochlorothiazide, letrozole, losartan, lubiprostone, magnesium oxide, melatonin, meloxicam, metoprolol succinate, multivitamin, nitrofurantoin (macrocrystal-monohydrate), omeprazole, pantoprazole, and rosuvastatin  History Review  Allergy: Ms. Tourville is allergic to sulfa antibiotics and vesicare [solifenacin]. Drug: Ms. Stief  reports no  history of drug use. Alcohol:  reports no history of alcohol use. Tobacco:  reports that she has never smoked. She has never used smokeless tobacco. Social: Ms. Moncivais  reports that she has never smoked. She has never used smokeless tobacco. She reports that she does not drink alcohol and does not use drugs. Medical:  has a past medical history of Acute postoperative pain (10/25/2017), Anemia, Arm pain (07/26/2015), Arthritis, Arthritis, degenerative (03/26/2014), Back pain (11/01/2013), Bladder infection (06/2018), Breast cancer ([place]), Breast cancer ([place]), CHEST PAIN (04/29/2010), Chronic cystitis, Cystocele (02/22/2013), Cystocele, midline (08/19/2013), Degeneration of intervertebral disc of lumbosacral region (03/26/2014), DYSPNEA (04/29/2010), Enthesopathy of hip (03/26/2014), GERD (gastroesophageal reflux disease), Hiatal hernia, HTN (hypertension), Hypokalemia (06/2018), Hyponatremia (06/2018), LBP (low back pain) (03/26/2014), Neck pain (11/01/2013), Parkinson disease ([place]), Pneumonia (06/2018), Sinusitis (02/07/2015), Skin lesions (07/12/2014), and Urinary incontinence. Surgical: Ms. Hausler  has a past surgical history that includes Tonsillectomy and adenoidectomy (81); Vesicovaginal fistula closure w/ TAH (1983); Breast surgery (1986); Breast enhancement surgery (1987); Breast implant removal; Breast implant removal (Right, 08/29/2012); Mastectomy (08/2012); Abdominal hysterectomy; Colonoscopy with propofol (N/A, 09/13/2016); and Breast biopsy (2013). Family: family history includes Colon polyps in her father; Diabetes in her father; Parkinson's disease in her mother; Stroke in her father and mother.  Laboratory Chemistry Profile   Renal Lab Results  Component Value Date   BUN 24 (H) 05/09/2022   CREATININE 1.15 (H) 05/09/2022   BCR 20 06/11/2020   GFR 29.45 (L) 03/17/2022   GFRAA >60 07/27/2020   GFRNONAA 47 (L) 05/09/2022    Hepatic Lab Results  Component Value Date   AST 27 03/07/2022   ALT 18  03/07/2022   ALBUMIN 4.3 03/07/2022   ALKPHOS 45 03/07/2022    Electrolytes Lab Results  Component Value Date   NA 135 05/09/2022   K 4.0 05/09/2022   CL 99 05/09/2022   CALCIUM 9.2 05/09/2022   MG 2.2 07/17/2018    Bone Lab Results  Component Value Date   25OHVITD1 42 07/17/2018   25OHVITD2 <1.0 07/17/2018   25OHVITD3 42 07/17/2018    Inflammation (CRP: Acute Phase) (ESR: Chronic Phase) Lab Results  Component Value Date   CRP 3 07/17/2018   ESRSEDRATE 27 07/17/2018         Note: Above Lab results reviewed.  Recent Imaging Review  DG PAIN CLINIC C-ARM 1-60 MIN NO REPORT Fluoro was used, but no Radiologist interpretation will be provided.  Please refer to "NOTES" tab for provider progress note. Note: Reviewed        Physical Exam  General appearance: Well nourished, well developed, and well hydrated. In no apparent acute distress Mental status: Alert, oriented x 3 (person, place, & time)       Respiratory: No evidence of acute respiratory distress Eyes: PERLA Vitals: LMP 12/18/1981  BMI: Estimated body mass index is 30.9 kg/m as calculated from the following:   Height as of 06/12/22: 5' 4" (1.626 m).   Weight as of 06/12/22: 180 lb (81.6 kg). Ideal: Ideal body weight: 54.7 kg (120 lb 9.5 oz) Adjusted ideal body weight: 65.5 kg (144 lb 5.7 oz)  Assessment   Diagnosis Status  No diagnosis found. Controlled Controlled Controlled   Updated Problems: No problems updated.  Plan of Care  Problem-specific:  No problem-specific Assessment & Plan notes found for this encounter.  [name] has a current medication list which includes the following long-term medication(s): amlodipine, calcium carbonate, citalopram, cyclobenzaprine, donepezil, donepezil, gabapentin, hydrochlorothiazide, [START ON 06/26/2022] hydrocodone-acetaminophen, [START ON 07/26/2022] hydrocodone-acetaminophen, [START ON 08/25/2022] hydrocodone-acetaminophen, losartan, magnesium oxide,  meloxicam, metoprolol succinate, omeprazole, pantoprazole, and rosuvastatin.  Pharmacotherapy (Medications Ordered): No orders of the defined types were placed in this encounter.  Orders:  No orders of the defined types were placed in this encounter.  Follow-up plan:   No follow-ups on file.     Interventional Therapies  Risk  Complexity Considerations:   Estimated body mass index is 30.73 kg/m as calculated from the following:   Height as of this encounter: 5' 4" (1.626 m).   Weight as of this encounter: 179 lb (81.2 kg). WNL   Planned  Pending:   Therapeutic right IA steroid knee inj. #3 (06/12/2022)    Under consideration:   Diagnostic right genicular NB  Diagnostic bilateral cervical facet MBB    Completed:   Therapeutic right superior/lateral trapezius muscle TPI x1 (04/19/2021)  Therapeutic right thoracic back TPI x2 (10/17/2018)  (DKFUA)  Therapeutic left thoracic back TPI x1 (10/17/2018)  (DKFUA)  Therapeutic right quadratus lumborum and erector spinae muscle TPI x1 (02/12/2018)  Therapeutic bilateral lumbar facet MBB x5 (08/09/2021) (DKFUA)  Therapeutic right lumbar facet MBB x4 (12/21/2020) (DKFUA)  Therapeutic right lumbar facet RFA x2 (06/04/2018) (100/90/75/75-100)  Therapeutic left lumbar facet RFA x1 (02/12/2018) (100/60/90/>75)  Diagnostic right SI joint Blk x3 (12/21/2020)  (DKFUA)  Diagnostic left SI joint Blk x1 (08/09/2017) (100/90/20/<25)  Therapeutic right SI joint RFA x3 (06/05/2019) (100/100/85/>50)  Therapeutic midline L2-3 LESI x1 (10/04/2017) (100/100/85/>50)  Therapeutic right L3-4 LESI x2 (11/04/2020) (100/100/20/<50)  Therapeutic right L4-5 LESI x2 (03/30/2022) (100/100/75/75)  Therapeutic right L5-S1 LESI x2 (12/04/2019) (100/100/50/75)  Therapeutic right L5 TFESI x4 (02/14/2022) (100/100/70/75)  Therapeutic right S1 TFESI x1 (11/04/2020) (100/100/20/<50)  Diagnostic bilateral superficial trochanteric bursa injec. x1 (04/30/2018) (DKFUA)   Diagnostic right IA hip injection x1 (02/13/2019) (DKFUA)  Diagnostic/therapeutic right (steroid) knee injection x1 (04/19/2021) (100/100/50/50)  Therapeutic right ([place]) knee injection x1 (10/06/2021)  (DKFUA)  Therapeutic right (Monovisc) knee injection x1 (06/07/2021) (100/100/100/75)    Therapeutic  Palliative (PRN) options:   Therapeutic/palliative lumbar facet RFA  Therapeutic/palliative lumbar facet MBB  Therapeutic left L2 TFESI (this level is responsible for her low back pain and left lower extremity pain.)      Recent Visits Date Type Provider Dept  06/12/22 Office Visit Milinda Pointer, MD [facility]  05/30/22 Procedure visit Milinda Pointer, MD [facility]  04/18/22 Office Visit Milinda Pointer, MD [facility]  03/30/22 Procedure visit Milinda Pointer, MD [facility]  Showing recent visits within past 90 days and meeting all other requirements Future Appointments Date Type Provider Dept  06/26/22 Appointment Milinda Pointer, MD [facility]  Showing future appointments within next 90 days and meeting all other requirements  I discussed the assessment and treatment plan with the patient. The patient was provided an opportunity to ask questions and all were answered. The patient agreed with the plan and demonstrated an understanding of the instructions.  Patient advised to call back or seek an in-person evaluation if the symptoms or condition worsens.  Duration of encounter: *** minutes.  Total time on encounter, as per AMA guidelines included both the face-to-face and non-face-to-face time personally spent by the physician and/or  other qualified health care professional(s) on the day of the encounter (includes time in activities that require the physician or other qualified health care professional and does not include time in activities normally performed by clinical staff). Physician's time may include the  following activities when performed: preparing to see the patient (eg, review of tests, pre-charting review of records) obtaining and/or reviewing separately obtained history performing a medically appropriate examination and/or evaluation counseling and educating the patient/family/caregiver ordering medications, tests, or procedures referring and communicating with other health care professionals (when not separately reported) documenting clinical information in the electronic or other health record independently interpreting results (not separately reported) and communicating results to the patient/ family/caregiver care coordination (not separately reported)  Note by: Gaspar Cola, MD Date: 06/26/2022; Time: 4:50 PM

## 2022-06-26 ENCOUNTER — Other Ambulatory Visit: Payer: Self-pay

## 2022-06-26 ENCOUNTER — Ambulatory Visit: Payer: Medicare Other | Attending: Pain Medicine | Admitting: Pain Medicine

## 2022-06-26 ENCOUNTER — Encounter: Payer: Self-pay | Admitting: Pain Medicine

## 2022-06-26 VITALS — BP 125/71 | HR 51 | Temp 97.3°F | Resp 18 | Ht 64.0 in | Wt 180.0 lb

## 2022-06-26 DIAGNOSIS — M5137 Other intervertebral disc degeneration, lumbosacral region: Secondary | ICD-10-CM | POA: Diagnosis not present

## 2022-06-26 DIAGNOSIS — M1711 Unilateral primary osteoarthritis, right knee: Secondary | ICD-10-CM | POA: Insufficient documentation

## 2022-06-26 DIAGNOSIS — R937 Abnormal findings on diagnostic imaging of other parts of musculoskeletal system: Secondary | ICD-10-CM | POA: Diagnosis not present

## 2022-06-26 DIAGNOSIS — M4807 Spinal stenosis, lumbosacral region: Secondary | ICD-10-CM | POA: Diagnosis not present

## 2022-06-26 DIAGNOSIS — M48061 Spinal stenosis, lumbar region without neurogenic claudication: Secondary | ICD-10-CM

## 2022-06-26 DIAGNOSIS — M48062 Spinal stenosis, lumbar region with neurogenic claudication: Secondary | ICD-10-CM

## 2022-06-26 DIAGNOSIS — R936 Abnormal findings on diagnostic imaging of limbs: Secondary | ICD-10-CM

## 2022-06-26 DIAGNOSIS — M25561 Pain in right knee: Secondary | ICD-10-CM | POA: Insufficient documentation

## 2022-06-26 DIAGNOSIS — M51379 Other intervertebral disc degeneration, lumbosacral region without mention of lumbar back pain or lower extremity pain: Secondary | ICD-10-CM

## 2022-06-26 DIAGNOSIS — M79604 Pain in right leg: Secondary | ICD-10-CM | POA: Diagnosis not present

## 2022-06-26 DIAGNOSIS — G8929 Other chronic pain: Secondary | ICD-10-CM | POA: Diagnosis not present

## 2022-06-26 NOTE — Progress Notes (Signed)
Safety precautions to be maintained throughout the outpatient stay will include: orient to surroundings, keep bed in low position, maintain call bell within reach at all times, provide assistance with transfer out of bed and ambulation.  

## 2022-06-26 NOTE — Patient Instructions (Signed)
____________________________________________________________________________________________  Pain Prevention Technique  Definition:   A technique used to minimize the effects of an activity known to cause inflammation or swelling, which in turn leads to an increase in pain.  Purpose: To prevent swelling from occurring. It is based on the fact that it is easier to prevent swelling from happening than it is to get rid of it, once it occurs.  Contraindications: Anyone with allergy or hypersensitivity to the recommended medications. Anyone taking anticoagulants (Blood Thinners) (e.g., Coumadin, Warfarin, Plavix, etc.). Patients in Renal Failure.  Technique: Before you undertake an activity known to cause pain, or a flare-up of your chronic pain, and before you experience any pain, do the following:  On a full stomach, take 4 (four) over the counter Ibuprofens '200mg'$  tablets (Motrin), for a total of 800 mg. In addition, take over the counter Magnesium 400 to 500 mg, before doing the activity.  Six (6) hours later, again on a full stomach, repeat the Ibuprofen. That night, take a warm shower and stretch under the running warm water.  This technique may be sufficient to abort the pain and discomfort before it happens. Keep in mind that it takes a lot less medication to prevent swelling than it takes to eliminate it once it occurs.  ____________________________________________________________________________________________  ______________________________________________________________________  Preparing for Procedure with Sedation  NOTICE: Due to recent regulatory changes, starting on July 18, 2021, procedures requiring intravenous (IV) sedation will no longer be performed at the [facility].  These types of procedures are required to be performed at [facility] ambulatory surgery facility.  We are very sorry for the inconvenience.  Procedure appointments are limited to planned  procedures: No Prescription Refills. No disability issues will be discussed. No medication changes will be discussed.  Instructions: Oral Intake: Do not eat or drink anything for at least 8 hours prior to your procedure. (Exception: Blood Pressure Medication. See below.) Transportation: A driver is required. You may not drive yourself after the procedure. Blood Pressure Medicine: Do not forget to take your blood pressure medicine with a sip of water the morning of the procedure. If your Diastolic (lower reading) is above 100 mmHg, elective cases will be cancelled/rescheduled. Blood thinners: These will need to be stopped for procedures. Notify our staff if you are taking any blood thinners. Depending on which one you take, there will be specific instructions on how and when to stop it. Diabetics on insulin: Notify the staff so that you can be scheduled 1st case in the morning. If your diabetes requires high dose insulin, take only  of your normal insulin dose the morning of the procedure and notify the staff that you have done so. Preventing infections: Shower with an antibacterial soap the morning of your procedure. Build-up your immune system: Take 1000 mg of Vitamin C with every meal (3 times a day) the day prior to your procedure. Antibiotics: Inform the staff if you have a condition or reason that requires you to take antibiotics before dental procedures. Pregnancy: If you are pregnant, call and cancel the procedure. Sickness: If you have a cold, fever, or any active infections, call and cancel the procedure. Arrival: You must be in the facility at least 30 minutes prior to your scheduled procedure. Children: Do not bring children with you. Dress appropriately: There is always the possibility that your clothing may get soiled. Valuables: Do not bring any jewelry or valuables.  Reasons to call and reschedule or cancel your procedure: (Following these recommendations will minimize  the risk  of a serious complication.) Surgeries: Avoid having procedures within 2 weeks of any surgery. (Avoid for 2 weeks before or after any surgery). Flu Shots: Avoid having procedures within 2 weeks of a flu shots. (Avoid for 2 weeks before or after immunizations). Barium: Avoid having a procedure within 7-10 days after having had a radiological study involving the use of radiological contrast. (Myelograms, Barium swallow or enema study). Heart attacks: Avoid any elective procedures or surgeries for the initial 6 months after a "Myocardial Infarction" (Heart Attack). Blood thinners: It is imperative that you stop these medications before procedures. Let us know if you if you take any blood thinner.  Infection: Avoid procedures during or within two weeks of an infection (including chest colds or gastrointestinal problems). Symptoms associated with infections include: Localized redness, fever, chills, night sweats or profuse sweating, burning sensation when voiding, cough, congestion, stuffiness, runny nose, sore throat, diarrhea, nausea, vomiting, cold or Flu symptoms, recent or current infections. It is specially important if the infection is over the area that we intend to treat. Heart and lung problems: Symptoms that may suggest an active cardiopulmonary problem include: cough, chest pain, breathing difficulties or shortness of breath, dizziness, ankle swelling, uncontrolled high or unusually low blood pressure, and/or palpitations. If you are experiencing any of these symptoms, cancel your procedure and contact your primary care physician for an evaluation.  Remember:  Regular Business hours are:  Monday to Thursday 8:00 AM to 4:00 PM  Provider's Schedule: Milinda Pointer, MD:  Procedure days: Tuesday and Thursday 7:30 AM to 4:00 PM  Gillis Santa, MD:  Procedure days: Monday and Wednesday 7:30 AM to 4:00 PM ______________________________________________________________________   ____________________________________________________________________________________________  General Risks and Possible Complications  Patient Responsibilities: It is important that you read this as it is part of your informed consent. It is our duty to inform you of the risks and possible complications associated with treatments offered to you. It is your responsibility as a patient to read this and to ask questions about anything that is not clear or that you believe was not covered in this document.  Patient's Rights: You have the right to refuse treatment. You also have the right to change your mind, even after initially having agreed to have the treatment done. However, under this last option, if you wait until the last second to change your mind, you may be charged for the materials used up to that point.  Introduction: Medicine is not an Chief Strategy Officer. Everything in Medicine, including the lack of treatment(s), carries the potential for danger, harm, or loss (which is by definition: Risk). In Medicine, a complication is a secondary problem, condition, or disease that can aggravate an already existing one. All treatments carry the risk of possible complications. The fact that a side effects or complications occurs, does not imply that the treatment was conducted incorrectly. It must be clearly understood that these can happen even when everything is done following the highest safety standards.  No treatment: You can choose not to proceed with the proposed treatment alternative. The "PRO(s)" would include: avoiding the risk of complications associated with the therapy. The "CON(s)" would include: not getting any of the treatment benefits. These benefits fall under one of three categories: diagnostic; therapeutic; and/or palliative. Diagnostic benefits include: getting information which can ultimately lead to improvement of the disease or symptom(s). Therapeutic benefits are those associated with the  successful treatment of the disease. Finally, palliative benefits are those related to the decrease  of the primary symptoms, without necessarily curing the condition (example: decreasing the pain from a flare-up of a chronic condition, such as incurable terminal cancer).  General Risks and Complications: These are associated to most interventional treatments. They can occur alone, or in combination. They fall under one of the following six (6) categories: no benefit or worsening of symptoms; bleeding; infection; nerve damage; allergic reactions; and/or death. No benefits or worsening of symptoms: In Medicine there are no guarantees, only probabilities. No healthcare provider can ever guarantee that a medical treatment will work, they can only state the probability that it may. Furthermore, there is always the possibility that the condition may worsen, either directly, or indirectly, as a consequence of the treatment. Bleeding: This is more common if the patient is taking a blood thinner, either prescription or over the counter (example: Goody Powders, Fish oil, Aspirin, Garlic, etc.), or if suffering a condition associated with impaired coagulation (example: Hemophilia, cirrhosis of the liver, low platelet counts, etc.). However, even if you do not have one on these, it can still happen. If you have any of these conditions, or take one of these drugs, make sure to notify your treating physician. Infection: This is more common in patients with a compromised immune system, either due to disease (example: diabetes, cancer, human immunodeficiency virus [HIV], etc.), or due to medications or treatments (example: therapies used to treat cancer and rheumatological diseases). However, even if you do not have one on these, it can still happen. If you have any of these conditions, or take one of these drugs, make sure to notify your treating physician. Nerve Damage: This is more common when the treatment is an invasive  one, but it can also happen with the use of medications, such as those used in the treatment of cancer. The damage can occur to small secondary nerves, or to large primary ones, such as those in the spinal cord and brain. This damage may be temporary or permanent and it may lead to impairments that can range from temporary numbness to permanent paralysis and/or brain death. Allergic Reactions: Any time a substance or material comes in contact with our body, there is the possibility of an allergic reaction. These can range from a mild skin rash (contact dermatitis) to a severe systemic reaction (anaphylactic reaction), which can result in death. Death: In general, any medical intervention can result in death, most of the time due to an unforeseen complication. ____________________________________________________________________________________________

## 2022-07-05 ENCOUNTER — Ambulatory Visit
Admission: RE | Admit: 2022-07-05 | Discharge: 2022-07-05 | Disposition: A | Discharge status: Home / Self Care | Payer: Medicare Other | Source: Ambulatory Visit | Attending: Oncology | Admitting: Oncology

## 2022-07-05 DIAGNOSIS — M810 Age-related osteoporosis without current pathological fracture: Secondary | ICD-10-CM | POA: Insufficient documentation

## 2022-07-05 DIAGNOSIS — Z780 Asymptomatic menopausal state: Secondary | ICD-10-CM | POA: Diagnosis not present

## 2022-07-05 DIAGNOSIS — M85852 Other specified disorders of bone density and structure, left thigh: Secondary | ICD-10-CM | POA: Diagnosis not present

## 2022-07-07 ENCOUNTER — Encounter: Payer: Medicare Other | Admitting: Internal Medicine

## 2022-07-10 ENCOUNTER — Telehealth: Payer: Self-pay
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
## 2022-07-19 DIAGNOSIS — H02403 Unspecified ptosis of bilateral eyelids: Secondary | ICD-10-CM | POA: Insufficient documentation

## 2022-07-19 DIAGNOSIS — H02831 Dermatochalasis of right upper eyelid: Secondary | ICD-10-CM | POA: Insufficient documentation

## 2022-07-26 NOTE — Progress Notes (Unsigned)
PROVIDER NOTE: Interpretation of information contained herein should be left to medically-trained personnel. Specific patient instructions are provided elsewhere under "Patient Instructions" section of medical record. This document was created in part using STT-dictation technology, any transcriptional errors that may result from this process are unintentional.  Patient: Wanda Martinez Type: Established DOB: 1935-03-24 MRN: [id number] PCP: Einar Pheasant, MD  Service: Procedure DOS: 07/27/2022 Setting: Ambulatory Location: Ambulatory outpatient facility Delivery: Face-to-face Provider: Gaspar Cola, MD Specialty: Interventional Pain Management Specialty designation: 09 Location: Outpatient facility Ref. Prov.: Milinda Pointer, MD    Primary Reason for Visit: Interventional Pain Management Treatment. CC: No chief complaint on file.   Procedure:           Type: Lumbar trans-foraminal epidural steroid injection (L-TFESI) #1  Laterality: Right (-RT)  Level: L3 & L4 nerve root(s) Imaging: Fluoroscopy-guided         Anesthesia: Local anesthesia (1-2% Lidocaine) Anxiolysis: None                 Sedation:                .  DOS: 07/27/2022  Performed by: Gaspar Cola, MD  Purpose: Diagnostic/Therapeutic Indications: Lumbar radicular pain severe enough to impact quality of life or function. No diagnosis found. NAS-11 Pain score:   Pre-procedure:  /10   Post-procedure:  /10     Position / Prep / Materials:  Position: Prone  Prep solution: DuraPrep (Iodine Povacrylex [0.7% available iodine] and Isopropyl Alcohol, 74% w/w) Prep Area: Entire Posterior Lumbosacral Area.  From the lower tip of the scapula down to the tailbone and from flank to flank. Materials:  Tray: Block Needle(s):  Type: Spinal  Gauge (G): 22  Length: 5-in  Qty:    ***     Pre-op H&P Assessment:  Wanda Martinez is a 87 y.o. (year old), female patient, seen today for interventional treatment. She   has a past surgical history that includes Tonsillectomy and adenoidectomy (81); Vesicovaginal fistula closure w/ TAH (1983); Breast surgery (1986); Breast enhancement surgery (1987); Breast implant removal; Breast implant removal (Right, 08/29/2012); Mastectomy (08/2012); Abdominal hysterectomy; Colonoscopy with propofol (N/A, 09/13/2016); and Breast biopsy (2013). [name] has a current medication list which includes the following prescription(s): amlodipine, aspirin ec, calcium carbonate, citalopram, cyclobenzaprine, donepezil, donepezil, gabapentin, glucosamine sulfate, glucosamine-chondroitin, hydrochlorothiazide, hydrocodone-acetaminophen, hydrocodone-acetaminophen, [START ON 08/25/2022] hydrocodone-acetaminophen, letrozole, losartan, lubiprostone, magnesium oxide, melatonin, meloxicam, metoprolol succinate, multivitamin, nitrofurantoin (macrocrystal-monohydrate), omega-3, omeprazole, pantoprazole, and rosuvastatin. Her primarily concern today is the No chief complaint on file.  Initial Vital Signs:  Pulse/HCG Rate:    Temp:   Resp:   BP:   SpO2:    BMI: Estimated body mass index is 30.9 kg/m as calculated from the following:   Height as of 06/26/22: '5\' 4"'$  (1.626 m).   Weight as of 06/26/22: 180 lb (81.6 kg).  Risk Assessment: Allergies: Reviewed. She is allergic to sulfa antibiotics and vesicare [solifenacin].  Allergy Precautions: None required Coagulopathies: Reviewed. None identified.  Blood-thinner therapy: None at this time Active Infection(s): Reviewed. None identified. Wanda Martinez is afebrile  Site Confirmation: [name] was asked to confirm the procedure and laterality before marking the site Procedure checklist: Completed Consent: Before the procedure and under the influence of no sedative(s), amnesic(s), or anxiolytics, the patient was informed of the treatment options, risks and possible complications. To fulfill our ethical and legal obligations, as recommended by the American  Medical Association's Code of Ethics, I have informed the patient  of my clinical impression; the nature and purpose of the treatment or procedure; the risks, benefits, and possible complications of the intervention; the alternatives, including doing nothing; the risk(s) and benefit(s) of the alternative treatment(s) or procedure(s); and the risk(s) and benefit(s) of doing nothing. The patient was provided information about the general risks and possible complications associated with the procedure. These may include, but are not limited to: failure to achieve desired goals, infection, bleeding, organ or nerve damage, allergic reactions, paralysis, and death. In addition, the patient was informed of those risks and complications associated to Spine-related procedures, such as failure to decrease pain; infection (i.e.: Meningitis, epidural or intraspinal abscess); bleeding (i.e.: epidural hematoma, subarachnoid hemorrhage, or any other type of intraspinal or peri-dural bleeding); organ or nerve damage (i.e.: Any type of peripheral nerve, nerve root, or spinal cord injury) with subsequent damage to sensory, motor, and/or autonomic systems, resulting in permanent pain, numbness, and/or weakness of one or several areas of the body; allergic reactions; (i.e.: anaphylactic reaction); and/or death. Furthermore, the patient was informed of those risks and complications associated with the medications. These include, but are not limited to: allergic reactions (i.e.: anaphylactic or anaphylactoid reaction(s)); adrenal axis suppression; blood sugar elevation that in diabetics may result in ketoacidosis or comma; water retention that in patients with history of congestive heart failure may result in shortness of breath, pulmonary edema, and decompensation with resultant heart failure; weight gain; swelling or edema; medication-induced neural toxicity; particulate matter embolism and blood vessel occlusion with resultant organ,  and/or nervous system infarction; and/or aseptic necrosis of one or more joints. Finally, the patient was informed that Medicine is not an exact science; therefore, there is also the possibility of unforeseen or unpredictable risks and/or possible complications that may result in a catastrophic outcome. The patient indicated having understood very clearly. We have given the patient no guarantees and we have made no promises. Enough time was given to the patient to ask questions, all of which were answered to the patient's satisfaction. Ms. Derick has indicated that she wanted to continue with the procedure. Attestation: I, the ordering provider, attest that I have discussed with the patient the benefits, risks, side-effects, alternatives, likelihood of achieving goals, and potential problems during recovery for the procedure that I have provided informed consent. Date  Time: {CHL [facility]-PAIN TIME CHOICES:21018001}  Pre-Procedure Preparation:  Monitoring: As per clinic protocol. Respiration, ETCO2, SpO2, BP, heart rate and rhythm monitor placed and checked for adequate function Safety Precautions: Patient was assessed for positional comfort and pressure points before starting the procedure. Time-out: I initiated and conducted the "Time-out" before starting the procedure, as per protocol. The patient was asked to participate by confirming the accuracy of the "Time Out" information. Verification of the correct person, site, and procedure were performed and confirmed by me, the nursing staff, and the patient. "Time-out" conducted as per Joint Commission's Universal Protocol (UP.01.01.01). Time:    Description/Narrative of Procedure:          Target: The 6 o'clock position under the pedicle, on the affected side. Region: Posterolateral Lumbosacral Approach: Posterior Percutaneous Paravertebral approach.  Rationale (medical necessity): procedure needed and proper for the diagnosis and/or treatment of the  patient's medical symptoms and needs. Procedural Technique Safety Precautions: Aspiration looking for blood return was conducted prior to all injections. At no point did we inject any substances, as a needle was being advanced. No attempts were made at seeking any paresthesias. Safe injection practices and needle disposal techniques used.  Medications properly checked for expiration dates. SDV (single dose vial) medications used. Description of the Procedure: Protocol guidelines were followed. The patient was placed in position over the procedure table. The target area was identified and the area prepped in the usual manner. Skin & deeper tissues infiltrated with local anesthetic. Appropriate amount of time allowed to pass for local anesthetics to take effect. The procedure needles were then advanced to the target area. Proper needle placement secured. Negative aspiration confirmed. Solution injected in intermittent fashion, asking for systemic symptoms every 0.5cc of injectate. The needles were then removed and the area cleansed, making sure to leave some of the prepping solution back to take advantage of its long term bactericidal properties.  There were no vitals filed for this visit.  Start Time:   hrs. End Time:   hrs.  Imaging Guidance (Spinal):          Type of Imaging Technique: Fluoroscopy Guidance (Spinal) Indication(s): Assistance in needle guidance and placement for procedures requiring needle placement in or near specific anatomical locations not easily accessible without such assistance. Exposure Time: Please see nurses notes. Contrast: Before injecting any contrast, we confirmed that the patient did not have an allergy to iodine, shellfish, or radiological contrast. Once satisfactory needle placement was completed at the desired level, radiological contrast was injected. Contrast injected under live fluoroscopy. No contrast complications. See chart for type and volume of contrast  used. Fluoroscopic Guidance: I was personally present during the use of fluoroscopy. "Tunnel Vision Technique" used to obtain the best possible view of the target area. Parallax error corrected before commencing the procedure. "Direction-depth-direction" technique used to introduce the needle under continuous pulsed fluoroscopy. Once target was reached, antero-posterior, oblique, and lateral fluoroscopic projection used confirm needle placement in all planes. Images permanently stored in EMR. Interpretation: I personally interpreted the imaging intraoperatively. Adequate needle placement confirmed in multiple planes. Appropriate spread of contrast into desired area was observed. No evidence of afferent or efferent intravascular uptake. No intrathecal or subarachnoid spread observed. Permanent images saved into the patient's record.  Antibiotic Prophylaxis:   Anti-infectives (From admission, onward)    None      Indication(s): None identified  Post-operative Assessment:  Post-procedure Vital Signs:  Pulse/HCG Rate:    Temp:   Resp:   BP:   SpO2:    EBL: None  Complications: No immediate post-treatment complications observed by team, or reported by patient.  Note: The patient tolerated the entire procedure well. A repeat set of vitals were taken after the procedure and the patient was kept under observation following institutional policy, for this type of procedure. Post-procedural neurological assessment was performed, showing return to baseline, prior to discharge. The patient was provided with post-procedure discharge instructions, including a section on how to identify potential problems. Should any problems arise concerning this procedure, the patient was given instructions to immediately contact us, at any time, without hesitation. In any case, we plan to contact the patient by telephone for a follow-up status report regarding this interventional procedure.  Comments:  No additional  relevant information.  Plan of Care  Orders:  No orders of the defined types were placed in this encounter.  Chronic Opioid Analgesic:  Hydrocodone/APAP 5/325, 1 tab PO q 8 hrs (15 mg/day of hydrocodone) MME/day: 15 mg/day.   Medications ordered for procedure: No orders of the defined types were placed in this encounter.  Medications administered: Karyn T. Lahaie had no medications administered during this visit.  See the medical  record for exact dosing, route, and time of administration.  Follow-up plan:   No follow-ups on file.       Interventional Therapies  Risk  Complexity Considerations:   Estimated body mass index is 30.73 kg/m as calculated from the following:   Height as of this encounter: '5\' 4"'$  (1.626 m).   Weight as of this encounter: 179 lb (81.2 kg). WNL   Planned  Pending:   Therapeutic right L3 and L4 TFESI #1    Under consideration:   Diagnostic right genicular NB  Diagnostic bilateral cervical facet MBB    Completed:   Therapeutic right superior/lateral trapezius muscle TPI x1 (04/19/2021)  Therapeutic right thoracic back TPI x2 (10/17/2018)  (DKFUA)  Therapeutic left thoracic back TPI x1 (10/17/2018)  (DKFUA)  Therapeutic right quadratus lumborum and erector spinae muscle TPI x1 (02/12/2018)  Therapeutic bilateral lumbar facet MBB x5 (08/09/2021) (DKFUA)  Therapeutic right lumbar facet MBB x4 (12/21/2020) (DKFUA)  Therapeutic right lumbar facet RFA x2 (06/04/2018) (100/90/75/75-100)  Therapeutic left lumbar facet RFA x1 (02/12/2018) (100/60/90/>75)  Diagnostic right SI joint Blk x3 (12/21/2020)  (DKFUA)  Diagnostic left SI joint Blk x1 (08/09/2017) (100/90/20/<25)  Therapeutic right SI joint RFA x3 (06/05/2019) (100/100/85/>50)  Therapeutic midline L2-3 LESI x1 (10/04/2017) (100/100/85/>50)  Therapeutic right L3-4 LESI x2 (11/04/2020) (100/100/20/<50)  Therapeutic right L4-5 LESI x2 (03/30/2022) (100/100/75/75)  Therapeutic right L5-S1 LESI x2  (12/04/2019) (100/100/50/75)  Therapeutic right L5 TFESI x4 (02/14/2022) (100/100/70/75)  Therapeutic right S1 TFESI x1 (11/04/2020) (100/100/20/<50)  Diagnostic bilateral superficial trochanteric bursa injec. x1 (04/30/2018) (DKFUA)  Diagnostic right IA hip injection x1 (02/13/2019) (DKFUA)  Diagnostic/therapeutic right IA steroid knee inj. 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)  Therapeutic right (Z[place]) knee injection x1 (10/06/2021)  (DKFUA)  Therapeutic right (Monovisc) knee injection x1 (06/07/2021) (100/100/100/75)    Therapeutic  Palliative (PRN) options:   Therapeutic/palliative lumbar facet RFA  Therapeutic/palliative lumbar facet MBB  Therapeutic left L2 TFESI (this level is responsible for her low back pain and left lower extremity pain.)       Recent Visits Date Type Provider Dept  06/26/22 Office Visit Milinda Pointer, MD [facility]  06/12/22 Office Visit Milinda Pointer, MD [facility]  05/30/22 Procedure visit Milinda Pointer, MD [facility]  Showing recent visits within past 90 days and meeting all other requirements Future Appointments Date Type Provider Dept  07/27/22 Appointment Milinda Pointer, MD [facility]  Showing future appointments within next 90 days and meeting all other requirements  Disposition: Discharge home  Discharge (Date  Time): 07/27/2022;   hrs.   Primary Care Physician: Einar Pheasant, MD Location: [facility] Note by: Gaspar Cola, MD Date: 07/27/2022; Time: 7:29 AM  Disclaimer:  Medicine is not an Chief Strategy Officer. The only guarantee in medicine is that nothing is guaranteed. It is important to note that the decision to proceed with this intervention was based on the information collected from the patient. The Data and conclusions were drawn from the  patient's questionnaire, the interview, and the physical examination. Because the information was provided in large part by the patient, it cannot be guaranteed that it has not been purposely or unconsciously manipulated. Every effort has been made to obtain as much relevant data as possible for this evaluation. It is important to note that the conclusions that lead to this procedure are derived in  large part from the available data. Always take into account that the treatment will also be dependent on availability of resources and existing treatment guidelines, considered by other Pain Management Practitioners as being common knowledge and practice, at the time of the intervention. For Medico-Legal purposes, it is also important to point out that variation in procedural techniques and pharmacological choices are the acceptable norm. The indications, contraindications, technique, and results of the above procedure should only be interpreted and judged by a Board-Certified Interventional Pain Specialist with extensive familiarity and expertise in the same exact procedure and technique.

## 2022-07-27 ENCOUNTER — Encounter: Payer: Self-pay | Admitting: Pain Medicine

## 2022-07-27 ENCOUNTER — Ambulatory Visit: Payer: Medicare Other | Attending: Pain Medicine | Admitting: Pain Medicine

## 2022-07-27 ENCOUNTER — Other Ambulatory Visit: Payer: Self-pay

## 2022-07-27 ENCOUNTER — Ambulatory Visit
Admission: RE | Admit: 2022-07-27 | Discharge: 2022-07-27 | Disposition: A | Discharge status: Home / Self Care | Payer: Medicare Other | Source: Ambulatory Visit | Attending: Pain Medicine | Admitting: Pain Medicine

## 2022-07-27 VITALS — BP 150/49 | HR 54 | Temp 97.0°F | Resp 13 | Ht 64.0 in | Wt 179.0 lb

## 2022-07-27 DIAGNOSIS — M79604 Pain in right leg: Secondary | ICD-10-CM | POA: Diagnosis not present

## 2022-07-27 DIAGNOSIS — G8929 Other chronic pain: Secondary | ICD-10-CM | POA: Diagnosis not present

## 2022-07-27 DIAGNOSIS — M4807 Spinal stenosis, lumbosacral region: Secondary | ICD-10-CM | POA: Insufficient documentation

## 2022-07-27 DIAGNOSIS — R937 Abnormal findings on diagnostic imaging of other parts of musculoskeletal system: Secondary | ICD-10-CM | POA: Diagnosis not present

## 2022-07-27 DIAGNOSIS — M5137 Other intervertebral disc degeneration, lumbosacral region: Secondary | ICD-10-CM

## 2022-07-27 DIAGNOSIS — M48062 Spinal stenosis, lumbar region with neurogenic claudication: Secondary | ICD-10-CM | POA: Insufficient documentation

## 2022-07-27 DIAGNOSIS — M48061 Spinal stenosis, lumbar region without neurogenic claudication: Secondary | ICD-10-CM | POA: Insufficient documentation

## 2022-07-27 DIAGNOSIS — M545 Low back pain, unspecified: Secondary | ICD-10-CM | POA: Diagnosis not present

## 2022-07-27 DIAGNOSIS — E162 Hypoglycemia, unspecified: Secondary | ICD-10-CM | POA: Diagnosis not present

## 2022-07-27 DIAGNOSIS — M5416 Radiculopathy, lumbar region: Secondary | ICD-10-CM | POA: Insufficient documentation

## 2022-07-27 DIAGNOSIS — R001 Bradycardia, unspecified: Secondary | ICD-10-CM | POA: Diagnosis not present

## 2022-07-27 LAB — GLUCOSE, CAPILLARY: Glucose-Capillary: 59 mg/dL — ABNORMAL LOW (ref 70–99)

## 2022-07-27 MED ORDER — SODIUM CHLORIDE 0.9% FLUSH
2.0000 mL | Freq: Once | INTRAVENOUS | Status: AC
  Administered 2022-07-27: 2 mL

## 2022-07-27 MED ORDER — DEXAMETHASONE SODIUM PHOSPHATE 10 MG/ML IJ SOLN
20.0000 mg | Freq: Once | INTRAMUSCULAR | Status: AC
  Administered 2022-07-27: 20 mg
  Filled 2022-07-27: qty 2

## 2022-07-27 MED ORDER — LACTATED RINGERS IV SOLN: Freq: Once | INTRAVENOUS | Status: AC

## 2022-07-27 MED ORDER — GLYCOPYRROLATE 0.2 MG/ML IJ SOLN
0.2000 mg | Freq: Once | INTRAMUSCULAR | Status: AC
  Administered 2022-07-27: 0.2 mg via INTRAVENOUS

## 2022-07-27 MED ORDER — LIDOCAINE HCL 2 % IJ SOLN
20.0000 mL | Freq: Once | INTRAMUSCULAR | Status: AC
  Administered 2022-07-27: 400 mg
  Filled 2022-07-27: qty 20

## 2022-07-27 MED ORDER — PENTAFLUOROPROP-TETRAFLUOROETH EX AERO
INHALATION_SPRAY | Freq: Once | CUTANEOUS | Status: DC
  Filled 2022-07-27: qty 116

## 2022-07-27 MED ORDER — MIDAZOLAM HCL 5 MG/5ML IJ SOLN: 0.5000 mg | Freq: Once | INTRAMUSCULAR | Status: DC

## 2022-07-27 MED ORDER — IOHEXOL 180 MG/ML  SOLN
10.0000 mL | Freq: Once | INTRAMUSCULAR | Status: AC
  Administered 2022-07-27: 10 mL via INTRATHECAL
  Filled 2022-07-27: qty 20

## 2022-07-27 MED ORDER — ROPIVACAINE HCL 2 MG/ML IJ SOLN
2.0000 mL | Freq: Once | INTRAMUSCULAR | Status: AC
  Administered 2022-07-27: 2 mL via EPIDURAL
  Filled 2022-07-27: qty 20

## 2022-07-27 NOTE — Progress Notes (Signed)
[place] in room 0- heartrate 50 ordered to be given robinul 0.'2mg'$  iv--- Baseline heartrate 1124  BS 59  patient to go to [facility] and get crackers peanut butter

## 2022-07-27 NOTE — Patient Instructions (Signed)

## 2022-07-27 NOTE — Progress Notes (Signed)
Safety precautions to be maintained throughout the outpatient stay will include: orient to surroundings, keep bed in low position, maintain call bell within reach at all times, provide assistance with transfer out of bed and ambulation.  

## 2022-07-28 ENCOUNTER — Telehealth: Payer: Self-pay

## 2022-07-28 NOTE — Telephone Encounter (Signed)
Post procedure phone call.  Patient states she is doing well.  

## 2022-08-14 ENCOUNTER — Telehealth: Payer: Self-pay | Admitting: Pain Medicine

## 2022-08-14 NOTE — Telephone Encounter (Signed)
PT stated that she has pain in shoulder would like to see about injections

## 2022-08-14 NOTE — Telephone Encounter (Signed)
Returned patient phone call re; pain.  It is her knee that is bothering her and she would like to have an injection.  She has an appt tomorrow 08/15/22 @ 1440.  I told her that we could discuss it at that time.

## 2022-08-14 NOTE — Progress Notes (Unsigned)
PROVIDER NOTE: Information contained herein reflects review and annotations entered in association with encounter. Interpretation of such information and data should be left to medically-trained personnel. Information provided to patient can be located elsewhere in the medical record under "Patient Instructions". Document created using STT-dictation technology, any transcriptional errors that may result from process are unintentional.    Patient: Wanda Martinez  Service Category: E/M  Provider: Gaspar Cola, MD  DOB: Dec 08, 1935  DOS: 08/15/2022  Referring Provider: Einar Pheasant, MD  MRN: [id number]  Specialty: Interventional Pain Management  PCP: Einar Pheasant, MD  Type: Established Patient  Setting: Ambulatory outpatient    Location: Office  Delivery: Face-to-face     HPI  [name], a 87 y.o. year old female, is here today because of her Chronic pain of right lower extremity [M79.604, G89.29]. Ms. Piazza primary complain today is No chief complaint on file. Last encounter: My last encounter with her was on 07/27/2022. Pertinent problems: [name] has Headache; Parkinson's disease ([place]); Chronic low back pain (1ry area of Pain) (Bilateral) (R>L) w/o sciatica; Lumbar spondylosis; Chronic hip pain (Bilateral); Chronic neck pain; Cervical spondylosis; Chronic cervical radicular pain (Right); Diffuse myofascial pain syndrome; Neurogenic pain; Chronic upper back pain (Right); Myofascial pain syndrome (Right) (cervicothoracic); Lumbar facet syndrome (Bilateral) (R>L); History of breast cancer; Cervical facet hypertrophy; Cervical facet syndrome ([place]); Chronic shoulder pain (Right); Chronic pain syndrome; Chronic sacroiliac joint pain (Left); Chronic sacroiliac joint pain (Right); 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, L4-5, L5-S1) (Right); Lumbar spinal stenosis (with neurogenic claudication) (L3-4); Chronic lower extremity pain (2ry area of Pain) (Right); Chronic lumbar  radicular pain (S1) (Right); Trochanteric bursitis of hip (Bilateral); Spondylosis without myelopathy or radiculopathy, lumbosacral region; Trigger point with back pain (Right); DDD (degenerative disc disease), lumbosacral; Chronic upper extremity pain (Right); Chronic thoracic back pain (Bilateral) (L>R); Trigger point of thoracic region (Bilateral) (L>R); Other specified dorsopathies, sacral and sacrococcygeal region; Sacroiliac joint dysfunction (Right); Osteoarthritis of sacroiliac joint (Right); Somatic dysfunction of sacroiliac joint (Right); Chronic musculoskeletal pain; Facial pain; Chronic hip pain (Right); Osteoarthritis of hip (Right); Left ear pain; Abnormal MRI, lumbar spine (12/30/2019); Osteoarthritis involving multiple joints; Other spondylosis, sacral and sacrococcygeal region; Lumbar facet hypertrophy (Multilevel) (Bilateral); Chronic knee pain (Right); Osteoarthritis of knee (Right); Trigger point of shoulder region (Right); Osteoarthritis of AC (acromioclavicular) joint (Right); Osteoarthritis of shoulder (Right); Unspecified injury of muscle(s) and tendon(s) of the rotator cuff of shoulder, sequela (Right); Cervicalgia; DDD (degenerative disc disease), lumbar; Lumbar radicular pain (Bilateral); Lumbar foraminal stenosis (Right: L3-4, L4-5, L5-S1) (Left: L2-3); Abnormal MRI, hip (Right) (05/04/2022); Abnormal MRI, knee (01/04/2022); and Tricompartment osteoarthritis of knee (Right) on their pertinent problem list. Pain Assessment: Severity of   is reported as a  /10. Location:    / . Onset:  . Quality:  . Timing:  . Modifying factor(s):  Marland Kitchen Vitals:  vitals were not taken for this visit.   Reason for encounter: post-procedure evaluation and assessment. ***  Post-procedure evaluation   Type: Lumbar trans-foraminal epidural steroid injection (L-TFESI) #1  Laterality: Right (-RT)  Level: L3 & L4 nerve root(s) Imaging: Fluoroscopy-guided         Anesthesia: Local anesthesia (1-2%  Lidocaine) Anxiolysis: None                 Sedation: No Sedation              .  DOS: 07/27/2022  Performed by: Gaspar Cola, MD  Purpose: Diagnostic/Therapeutic Indications: Lumbar radicular pain severe  enough to impact quality of life or function. 1. DDD (degenerative disc disease), lumbosacral   2. Lumbar foraminal stenosis (Right: L3-4, L4-5, L5-S1) (Left: L2-3)   3. Lumbar spinal stenosis (with neurogenic claudication) (L3-4)   4. Lumbar radicular pain (Bilateral)   5. Chronic lower extremity pain (2ry area of Pain) (Right)   6. Chronic low back pain (1ry area of Pain) (Bilateral) (R>L) w/o sciatica    NAS-11 Pain score:   Pre-procedure: 6 /10   Post-procedure: 0-No pain/10   Note: As I was about to begin the procedure, I noticed the patient to be bradycardic, but asymptomatic.  For safety reasons, we started an IV and we provided the patient with Robinul 0.2 milligrams IV.  The procedure room nurse indicated to me that as she was bringing the patient into the room, she seemed to be a little "wobbly", but indicated that she was feeling well.  I ordered a POC Dextrostix which came back with a blood sugar in the 50s.  We did provide the patient with something to eat immediately after of the procedure.    Effectiveness:  Initial hour after procedure:   ***. Subsequent 4-6 hours post-procedure:   ***. Analgesia past initial 6 hours:   ***. Ongoing improvement:  Analgesic:  *** Function:    ***    ROM:    ***     Pharmacotherapy Assessment  Analgesic: Hydrocodone/APAP 5/325, 1 tab PO q 8 hrs (15 mg/day of hydrocodone) MME/day: 15 mg/day.   Monitoring: [place] PMP: PDMP reviewed during this encounter.       Pharmacotherapy: No side-effects or adverse reactions reported. Compliance: No problems identified. Effectiveness: Clinically acceptable.  No notes on file  No results found for: "CBDTHCR" No results found for: "D8THCCBX" No results found for: "D9THCCBX"  UDS:   Summary  Date Value Ref Range Status  06/12/2022 Note  Final    Comment:    ==================================================================== [facility] (MW) ==================================================================== Test                             Result       Flag       Units  Drug Present and Declared for Prescription Verification   Hydrocodone                    1766         EXPECTED   ng/mg creat   Dihydrocodeine                 303          EXPECTED   ng/mg creat   Norhydrocodone                 1597         EXPECTED   ng/mg creat    Sources of hydrocodone include scheduled prescription medications.    Dihydrocodeine and norhydrocodone are expected metabolites of    hydrocodone. Dihydrocodeine is also available as a scheduled    prescription medication.  ==================================================================== Test                      Result    Flag   Units      Ref Range   Creatinine              38               mg/dL      >=  20 ==================================================================== Declared Medications:  The flagging and interpretation on this report are based on the  following declared medications.  Unexpected results may arise from  inaccuracies in the declared medications.   **Note: The testing scope of this panel includes these medications:   Hydrocodone (Norco)   **Note: The testing scope of this panel does not include the  following reported medications:   Acetaminophen (Norco)  Amlodipine  Aspirin  Calcium  Chondroitin  Citalopram (Celexa)  Cyclobenzaprine (Flexeril)  Donepezil (Aricept)  Gabapentin (Neurontin)  Glucosamine  Letrozole (Femara)  Losartan (Cozaar)  Lubiprostone (Amitiza)  Magnesium (Mag-Ox)  Melatonin  Meloxicam (Mobic)  Metoprolol  Multivitamin  Nitrofurantoin (Macrobid)  Omega-3 Fatty Acids  Omeprazole  Pantoprazole (Protonix)  Rosuvastatin  (Crestor) ==================================================================== For clinical consultation, please call [phone]. ====================================================================       ROS  Constitutional: Denies any fever or chills Gastrointestinal: No reported hemesis, hematochezia, vomiting, or acute GI distress Musculoskeletal: Denies any acute onset joint swelling, redness, loss of ROM, or weakness Neurological: No reported episodes of acute onset apraxia, aphasia, dysarthria, agnosia, amnesia, paralysis, loss of coordination, or loss of consciousness  Medication Review  Glucosamine Sulfate, Glucosamine-Chondroitin, HYDROcodone-acetaminophen, Omega-3, amLODipine, aspirin EC, calcium carbonate, citalopram, cyclobenzaprine, donepezil, gabapentin, hydrochlorothiazide, letrozole, losartan, lubiprostone, magnesium oxide, melatonin, meloxicam, metoprolol succinate, multivitamin, nitrofurantoin (macrocrystal-monohydrate), omeprazole, pantoprazole, and rosuvastatin  History Review  Allergy: Ms. Simm is allergic to sulfa antibiotics and vesicare [solifenacin]. Drug: Ms. Maudlin  reports no history of drug use. Alcohol:  reports no history of alcohol use. Tobacco:  reports that she has never smoked. She has never used smokeless tobacco. Social: Ms. Whyte  reports that she has never smoked. She has never used smokeless tobacco. She reports that she does not drink alcohol and does not use drugs. Medical:  has a past medical history of Acute postoperative pain (10/25/2017), Anemia, Arm pain (07/26/2015), Arthritis, Arthritis, degenerative (03/26/2014), Back pain (11/01/2013), Bladder infection (06/2018), Breast cancer ([place]), Breast cancer ([place]), CHEST PAIN (04/29/2010), Chronic cystitis, Cystocele (02/22/2013), Cystocele, midline (08/19/2013), Degeneration of intervertebral disc of lumbosacral region (03/26/2014), DYSPNEA (04/29/2010), Enthesopathy of hip (03/26/2014), GERD  (gastroesophageal reflux disease), Hiatal hernia, HTN (hypertension), Hypokalemia (06/2018), Hyponatremia (06/2018), LBP (low back pain) (03/26/2014), Neck pain (11/01/2013), Parkinson disease ([place]), Pneumonia (06/2018), Sinusitis (02/07/2015), Skin lesions (07/12/2014), and Urinary incontinence. Surgical: Ms. Castell  has a past surgical history that includes Tonsillectomy and adenoidectomy (81); Vesicovaginal fistula closure w/ TAH (1983); Breast surgery (1986); Breast enhancement surgery (1987); Breast implant removal; Breast implant removal (Right, 08/29/2012); Mastectomy (08/2012); Abdominal hysterectomy; Colonoscopy with propofol (N/A, 09/13/2016); and Breast biopsy (2013). Family: family history includes Colon polyps in her father; Diabetes in her father; Parkinson's disease in her mother; Stroke in her father and mother.  Laboratory Chemistry Profile   Renal Lab Results  Component Value Date   BUN 24 (H) 05/09/2022   CREATININE 1.15 (H) 05/09/2022   BCR 20 06/11/2020   GFR 29.45 (L) 03/17/2022   GFRAA >60 07/27/2020   GFRNONAA 47 (L) 05/09/2022    Hepatic Lab Results  Component Value Date   AST 27 03/07/2022   ALT 18 03/07/2022   ALBUMIN 4.3 03/07/2022   ALKPHOS 45 03/07/2022    Electrolytes Lab Results  Component Value Date   NA 135 05/09/2022   K 4.0 05/09/2022   CL 99 05/09/2022   CALCIUM 9.2 05/09/2022   MG 2.2 07/17/2018    Bone Lab Results  Component Value Date   25OHVITD1 42 07/17/2018   25OHVITD2 <1.0 07/17/2018  25OHVITD3 42 07/17/2018    Inflammation (CRP: Acute Phase) (ESR: Chronic Phase) Lab Results  Component Value Date   CRP 3 07/17/2018   ESRSEDRATE 27 07/17/2018         Note: Above Lab results reviewed.  Recent Imaging Review  DG PAIN CLINIC C-ARM 1-60 MIN NO REPORT Fluoro was used, but no Radiologist interpretation will be provided.  Please refer to "NOTES" tab for provider progress note. Note: Reviewed        Physical Exam  General appearance:  Well nourished, well developed, and well hydrated. In no apparent acute distress Mental status: Alert, oriented x 3 (person, place, & time)       Respiratory: No evidence of acute respiratory distress Eyes: PERLA Vitals: LMP 12/18/1981  BMI: Estimated body mass index is 30.73 kg/m as calculated from the following:   Height as of 07/27/22: _0  (1.626 m).   Weight as of 07/27/22: 179 lb (81.2 kg). Ideal: Patient weight not recorded  Assessment   Diagnosis Status  1. Chronic lower extremity pain (2ry area of Pain) (Right)   2. Chronic low back pain (1ry area of Pain) (Bilateral) (R>L) w/o sciatica   3. Lumbar radicular pain (Bilateral)    Controlled Controlled Controlled   Updated Problems: No problems updated.  Plan of Care  Problem-specific:  No problem-specific Assessment & Plan notes found for this encounter.  Ms. Jazzlyn Huizenga has a current medication list which includes the following long-term medication(s): amlodipine, calcium carbonate, citalopram, cyclobenzaprine, donepezil, donepezil, gabapentin, hydrochlorothiazide, hydrocodone-acetaminophen, hydrocodone-acetaminophen, [START ON 08/25/2022] hydrocodone-acetaminophen, losartan, magnesium oxide, meloxicam, metoprolol succinate, omeprazole, pantoprazole, and rosuvastatin.  Pharmacotherapy (Medications Ordered): No orders of the defined types were placed in this encounter.  Orders:  No orders of the defined types were placed in this encounter.  Follow-up plan:   No follow-ups on file.     Interventional Therapies  Risk  Complexity Considerations:   Estimated body mass index is 30.73 kg/m as calculated from the following:   Height as of this encounter: _1  (1.626 m).   Weight as of this encounter: 179 lb (81.2 kg). WNL   Planned  Pending:   Therapeutic right L3 and L4 TFESI #1    Under consideration:   Diagnostic right genicular NB  Diagnostic bilateral cervical facet MBB    Completed:   Therapeutic  right superior/lateral trapezius muscle TPI x1 (04/19/2021)  Therapeutic right thoracic back TPI x2 (10/17/2018)  (DKFUA)  Therapeutic left thoracic back TPI x1 (10/17/2018)  (DKFUA)  Therapeutic right quadratus lumborum and erector spinae muscle TPI x1 (02/12/2018)  Therapeutic bilateral lumbar facet MBB x5 (08/09/2021) (DKFUA)  Therapeutic right lumbar facet MBB x4 (12/21/2020) (DKFUA)  Therapeutic right lumbar facet RFA x2 (06/04/2018) (100/90/75/75-100)  Therapeutic left lumbar facet RFA x1 (02/12/2018) (100/60/90/>75)  Diagnostic right SI joint Blk x3 (12/21/2020)  (DKFUA)  Diagnostic left SI joint Blk x1 (08/09/2017) (100/90/20/<25)  Therapeutic right SI joint RFA x3 (06/05/2019) (100/100/85/>50)  Therapeutic midline L2-3 LESI x1 (10/04/2017) (100/100/85/>50)  Therapeutic right L3-4 LESI x2 (11/04/2020) (100/100/20/<50)  Therapeutic right L4-5 LESI x2 (03/30/2022) (100/100/75/75)  Therapeutic right L5-S1 LESI x2 (12/04/2019) (100/100/50/75)  Therapeutic right L5 TFESI x4 (02/14/2022) (100/100/70/75)  Therapeutic right S1 TFESI x1 (11/04/2020) (100/100/20/<50)  Diagnostic bilateral superficial trochanteric bursa injec. x1 (04/30/2018) (DKFUA)  Diagnostic right IA hip injection x1 (02/13/2019) (DKFUA)  Diagnostic/therapeutic right IA steroid knee inj. x2 (06/12/2022) (80/80/80/80) (80% of her right knee pain is articular while the other 20% is likely to be  coming from her foraminal stenosis)  Therapeutic right ([place]) knee injection x1 (10/06/2021)  (DKFUA)  Therapeutic right (Monovisc) knee injection x1 (06/07/2021) (100/100/100/75)    Therapeutic  Palliative (PRN) options:   Therapeutic/palliative lumbar facet RFA  Therapeutic/palliative lumbar facet MBB  Therapeutic left L2 TFESI (this level is responsible for her low back pain and left lower extremity pain.)        Recent Visits Date Type Provider Dept  07/27/22 Procedure visit Milinda Pointer, MD [facility]   06/26/22 Office Visit Milinda Pointer, MD [facility]  06/12/22 Office Visit Milinda Pointer, MD [facility]  05/30/22 Procedure visit Milinda Pointer, MD [facility]  Showing recent visits within past 90 days and meeting all other requirements Future Appointments Date Type Provider Dept  08/15/22 Appointment Milinda Pointer, MD [facility]  Showing future appointments within next 90 days and meeting all other requirements  I discussed the assessment and treatment plan with the patient. The patient was provided an opportunity to ask questions and all were answered. The patient agreed with the plan and demonstrated an understanding of the instructions.  Patient advised to call back or seek an in-person evaluation if the symptoms or condition worsens.  Duration of encounter: *** minutes.  Total time on encounter, as per AMA guidelines included both the face-to-face and non-face-to-face time personally spent by the physician and/or other qualified health care professional(s) on the day of the encounter (includes time in activities that require the physician or other qualified health care professional and does not include time in activities normally performed by clinical staff). Physician's time may include the following activities when performed: preparing to see the patient (eg, review of tests, pre-charting review of records) obtaining and/or reviewing separately obtained history performing a medically appropriate examination and/or evaluation counseling and educating the patient/family/caregiver ordering medications, tests, or procedures referring and communicating with other health care professionals (when not separately reported) documenting clinical information in the electronic or other health record independently interpreting results (not separately reported) and communicating results to the patient/ family/caregiver care coordination  (not separately reported)  Note by: Gaspar Cola, MD Date: 08/15/2022; Time: 2:24 PM

## 2022-08-15 ENCOUNTER — Other Ambulatory Visit: Payer: Self-pay

## 2022-08-15 ENCOUNTER — Ambulatory Visit: Payer: Medicare Other | Attending: Pain Medicine | Admitting: Pain Medicine

## 2022-08-15 ENCOUNTER — Encounter: Payer: Self-pay | Admitting: Pain Medicine

## 2022-08-15 VITALS — BP 136/59 | HR 54 | Temp 98.7°F | Resp 16 | Ht 64.0 in | Wt 181.0 lb

## 2022-08-15 DIAGNOSIS — M545 Low back pain, unspecified: Secondary | ICD-10-CM | POA: Diagnosis not present

## 2022-08-15 DIAGNOSIS — M25561 Pain in right knee: Secondary | ICD-10-CM | POA: Insufficient documentation

## 2022-08-15 DIAGNOSIS — G8929 Other chronic pain: Secondary | ICD-10-CM | POA: Insufficient documentation

## 2022-08-15 DIAGNOSIS — M1711 Unilateral primary osteoarthritis, right knee: Secondary | ICD-10-CM | POA: Insufficient documentation

## 2022-08-15 DIAGNOSIS — M5416 Radiculopathy, lumbar region: Secondary | ICD-10-CM | POA: Insufficient documentation

## 2022-08-15 DIAGNOSIS — R936 Abnormal findings on diagnostic imaging of limbs: Secondary | ICD-10-CM | POA: Insufficient documentation

## 2022-08-15 DIAGNOSIS — M47816 Spondylosis without myelopathy or radiculopathy, lumbar region: Secondary | ICD-10-CM | POA: Diagnosis not present

## 2022-08-15 DIAGNOSIS — M79604 Pain in right leg: Secondary | ICD-10-CM | POA: Diagnosis not present

## 2022-08-15 MED ORDER — ROPIVACAINE HCL 2 MG/ML IJ SOLN
INTRAMUSCULAR | Status: AC
  Filled 2022-08-15: qty 20

## 2022-08-15 MED ORDER — ROPIVACAINE HCL 2 MG/ML IJ SOLN
2.0000 mL | Freq: Once | INTRAMUSCULAR | Status: AC
  Administered 2022-08-15: 2 mL via INTRA_ARTICULAR

## 2022-08-15 MED ORDER — METHYLPREDNISOLONE ACETATE 80 MG/ML IJ SUSP
80.0000 mg | Freq: Once | INTRAMUSCULAR | Status: AC
  Administered 2022-08-15: 80 mg via INTRA_ARTICULAR

## 2022-08-15 MED ORDER — LIDOCAINE HCL 2 % IJ SOLN
INTRAMUSCULAR | Status: AC
  Filled 2022-08-15: qty 20

## 2022-08-15 MED ORDER — METHYLPREDNISOLONE ACETATE 80 MG/ML IJ SUSP
INTRAMUSCULAR | Status: AC
  Filled 2022-08-15: qty 1

## 2022-08-15 MED ORDER — LIDOCAINE HCL 2 % IJ SOLN
20.0000 mL | Freq: Once | INTRAMUSCULAR | Status: AC
  Administered 2022-08-15: 400 mg

## 2022-08-15 NOTE — Progress Notes (Signed)
Safety precautions to be maintained throughout the outpatient stay will include: orient to surroundings, keep bed in low position, maintain call bell within reach at all times, provide assistance with transfer out of bed and ambulation.  

## 2022-08-15 NOTE — Progress Notes (Signed)
PROVIDER NOTE: Interpretation of information contained herein should be left to medically-trained personnel. Specific patient instructions are provided elsewhere under "Patient Instructions" section of medical record. This document was created in part using STT-dictation technology, any transcriptional errors that may result from this process are unintentional.  Patient: Wanda Martinez Type: Established DOB: Nov 26, 1935 MRN: [id number] PCP: Einar Pheasant, MD  Service: Procedure DOS: 08/15/2022 Setting: Ambulatory Location: Ambulatory outpatient facility Delivery: Face-to-face Provider: Gaspar Cola, MD Specialty: Interventional Pain Management Specialty designation: 09 Location: Outpatient facility Ref. Prov.: Einar Pheasant, MD    Primary Reason for Visit: Interventional Pain Management Treatment. CC: Back Pain (Lumbar bilateral right is worse ) and Knee Pain (Right )   Procedure:           Type: Steroid Intra-articular Knee Injection #3  Laterality: Right (-RT) Level/approach: Lateral Imaging guidance: None required (CPT-20610) Anesthesia: Local anesthesia (1-2% Lidocaine) Anxiolysis: None                 Sedation:   None required. No Fentanyl administered..  DOS: 08/15/2022  Performed by: Gaspar Cola, MD  Purpose: Diagnostic/Therapeutic Indications: Knee arthralgia associated to osteoarthritis of the knee 1. Chronic knee pain (Right)   2. Osteoarthritis of knee (Right)   3. Tricompartment osteoarthritis of knee (Right)   4. Abnormal MRI, knee (01/04/2022)    NAS-11 score:   Pre-procedure: 8 /10   Post-procedure: 0-No pain/10     Pre-Procedure Preparation  Monitoring: As per clinic protocol.  Risk Assessment: Vitals:  [id number] body mass index is 31.07 kg/m as calculated from the following:   Height as of this encounter: '5\' 4"'$  (1.626 m).   Weight as of this encounter: 181 lb (82.1 kg)., Rate:(!) 54 , BP:(!) 136/59, Resp:16, Temp:98.7 F (37.1  C), SpO2:100 %  Allergies: She is allergic to sulfa antibiotics and vesicare [solifenacin].  Precautions: No additional precautions required  Blood-thinner(s): None at this time  Coagulopathies: Reviewed. None identified.   Active Infection(s): Reviewed. None identified. Wanda Martinez is afebrile   Location setting: Exam room Position: Sitting w/ knee bent 90 degrees Safety Precautions: Patient was assessed for positional comfort and pressure points before starting the procedure. Prepping solution: DuraPrep (Iodine Povacrylex [0.7% available iodine] and Isopropyl Alcohol, 74% w/w) Prep Area: Entire knee region Approach: percutaneous, just above the tibial plateau, lateral to the infrapatellar tendon. Intended target: Intra-articular knee space Materials: Tray: Block Needle(s): Regular Qty: 1/side Length: 1.5-inch Gauge: 25G   Meds ordered this encounter  Medications   lidocaine (XYLOCAINE) 2 % (with pres) injection 400 mg   methylPREDNISolone acetate (DEPO-MEDROL) injection 80 mg   ropivacaine (PF) 2 mg/mL (0.2%) (NAROPIN) injection 2 mL    Orders Placed This Encounter  Procedures   LUMBAR FACET(MEDIAL BRANCH NERVE BLOCK) MBNB    Standing Status:   Future    Standing Expiration Date:   11/15/2022    Scheduling Instructions:     Procedure: Lumbar facet block (AKA.: Lumbosacral medial branch nerve block)     Side: Bilateral     Level: L3-4 & L5-S1 Facets (L2, L3, L4, L5, & S1 Medial Branch Nerves)     Sedation: Patient's choice.     Timeframe: ASAA    Order Specific Question:   Where will this procedure be performed?    Answer:   [facility]   KNEE INJECTION    Local Anesthetic & Steroid injection.    Scheduling Instructions:     Side(s): Right Knee  Sedation: None     Timeframe: Today    Order Specific Question:   Where will this procedure be performed?    Answer:   [facility]   MR LUMBAR SPINE WO CONTRAST    Patient presents with axial pain with  possible radicular component. Please assist [place] in identifying specific level(s) and laterality of any additional findings such as: 1. Facet (Zygapophyseal) joint DJD (Hypertrophy, space narrowing, subchondral sclerosis, and/or osteophyte formation) 2. DDD and/or IVDD (Loss of disc height, desiccation, gas patterns, osteophytes, endplate sclerosis, or "Black disc disease") 3. Pars defects 4. Spondylolisthesis, spondylosis, and/or spondyloarthropathies (include Degree/Grade of displacement in mm) (stability) 5. Vertebral body Fractures (acute/chronic) (state percentage of collapse) 6. Demineralization (osteopenia/osteoporotic) 7. Bone pathology 8. Foraminal narrowing  9. Surgical changes 10. Central, Lateral Recess, and/or Foraminal Stenosis (include AP diameter of stenosis in mm) 11. Surgical changes (hardware type, status, and presence of fibrosis) 12. Modic Type Changes (MRI only) 13. IVDD (Disc bulge, protrusion, herniation, extrusion) (Level, laterality, extent)    Standing Status:   Future    Standing Expiration Date:   09/15/2022    Scheduling Instructions:     Imaging must be done as soon as possible. Inform patient that order will expire within 30 days and I will not renew it.    Order Specific Question:   What is the patient's sedation requirement?    Answer:   No Sedation    Order Specific Question:   Does the patient have a pacemaker or implanted devices?    Answer:   No    Order Specific Question:   Preferred imaging location?    Answer:   [facility] (table limit-350lbs)    Order Specific Question:   Call Results- Best Contact Number?    Answer:   [phone] ([facility])    Order Specific Question:   Radiology Contrast Protocol - do NOT remove file path    Answer:   [email or web address]   Informed Consent Details: Physician/Practitioner Attestation; Transcribe to consent form and obtain patient signature    Note: Always confirm  laterality of pain with Wanda Martinez, before procedure. Transcribe to consent form and obtain patient signature.    Order Specific Question:   Physician/Practitioner attestation of informed consent for procedure/surgical case    Answer:   I, the physician/practitioner, attest that I have discussed with the patient the benefits, risks, side effects, alternatives, likelihood of achieving goals and potential problems during recovery for the procedure that I have provided informed consent.    Order Specific Question:   Procedure    Answer:   Right-sided intra-articular knee arthrocentesis (aspiration and/or injection)    Order Specific Question:   Physician/Practitioner performing the procedure    Answer:   Laresa Oshiro A. Dossie Arbour, MD    Order Specific Question:   Indication/Reason    Answer:   Chronic right-sided knee pain secondary to knee arthropathy/arthralgia   Provide equipment / supplies at bedside    "Block Tray" (Disposable  single use) Needle type: SpinalRegular Amount/quantity: 2 Size: Short(1.5-inch) Gauge: (25G x1) + (22G x1)    Standing Status:   Standing    Number of Occurrences:   1    Order Specific Question:   Specify    Answer:   Block Tray     Time-out: 7564 I initiated and conducted the "Time-out" before starting the procedure, as per protocol. The patient was asked to participate by confirming the accuracy of the "Time Out" information. Verification of the  correct person, site, and procedure were performed and confirmed by me, the nursing staff, and the patient. "Time-out" conducted as per Joint Commission's Universal Protocol (UP.01.01.01). Procedure checklist: Completed   H&P (Pre-op  Assessment)  Wanda Martinez is a 87 y.o. (year old), female patient, seen today for interventional treatment. She  has a past surgical history that includes Tonsillectomy and adenoidectomy (81); Vesicovaginal fistula closure w/ TAH (1983); Breast surgery (1986); Breast enhancement surgery (1987);  Breast implant removal; Breast implant removal (Right, 08/29/2012); Mastectomy (08/2012); Abdominal hysterectomy; Colonoscopy with propofol (N/A, 09/13/2016); and Breast biopsy (2013). Wanda Martinez has a current medication list which includes the following prescription(s): amlodipine, aspirin ec, calcium carbonate, citalopram, cyclobenzaprine, donepezil, gabapentin, glucosamine-chondroitin, hydrochlorothiazide, hydrocodone-acetaminophen, [START ON 08/25/2022] hydrocodone-acetaminophen, letrozole, losartan, lubiprostone, magnesium oxide, melatonin, meloxicam, metoprolol succinate, multivitamin, nitrofurantoin (macrocrystal-monohydrate), omega-3, omeprazole, pantoprazole, rosuvastatin, donepezil, glucosamine sulfate, and hydrocodone-acetaminophen. Her primarily concern today is the Back Pain (Lumbar bilateral right is worse ) and Knee Pain (Right )  She is allergic to sulfa antibiotics and vesicare [solifenacin].   Last encounter: My last encounter with her was on 08/14/2022. Pertinent problems: Ms. Haggard has Headache; Parkinson's disease ([place]); Chronic low back pain (1ry area of Pain) (Bilateral) (R>L) w/o sciatica; Lumbar spondylosis; Chronic hip pain (Bilateral); Chronic neck pain; Cervical spondylosis; Chronic cervical radicular pain (Right); Diffuse myofascial pain syndrome; Neurogenic pain; Chronic upper back pain (Right); Myofascial pain syndrome (Right) (cervicothoracic); Lumbar facet syndrome (Bilateral) (R>L); History of breast cancer; Cervical facet hypertrophy; Cervical facet syndrome ([place]); Chronic shoulder pain (Right); Chronic pain syndrome; Chronic sacroiliac joint pain (Left); Chronic sacroiliac joint pain (Right); 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, L4-5, L5-S1) (Right); Lumbar spinal stenosis (with neurogenic claudication) (L3-4); Chronic lower extremity pain (2ry area of Pain) (Right); Chronic lumbar radicular pain (S1) (Right); Trochanteric bursitis of hip (Bilateral); Spondylosis without  myelopathy or radiculopathy, lumbosacral region; Trigger point with back pain (Right); DDD (degenerative disc disease), lumbosacral; Chronic upper extremity pain (Right); Chronic thoracic back pain (Bilateral) (L>R); Trigger point of thoracic region (Bilateral) (L>R); Other specified dorsopathies, sacral and sacrococcygeal region; Sacroiliac joint dysfunction (Right); Osteoarthritis of sacroiliac joint (Right); Somatic dysfunction of sacroiliac joint (Right); Chronic musculoskeletal pain; Facial pain; Chronic hip pain (Right); Osteoarthritis of hip (Right); Left ear pain; Abnormal MRI, lumbar spine (12/30/2019); Osteoarthritis involving multiple joints; Other spondylosis, sacral and sacrococcygeal region; Lumbar facet hypertrophy (Multilevel) (Bilateral); Chronic knee pain (Right); Osteoarthritis of knee (Right); Trigger point of shoulder region (Right); Osteoarthritis of AC (acromioclavicular) joint (Right); Osteoarthritis of shoulder (Right); Unspecified injury of muscle(s) and tendon(s) of the rotator cuff of shoulder, sequela (Right); Cervicalgia; DDD (degenerative disc disease), lumbar; Lumbar radicular pain (Bilateral); Lumbar foraminal stenosis (Right: L3-4, L4-5, L5-S1) (Left: L2-3); Abnormal MRI, hip (Right) (05/04/2022); Abnormal MRI, knee (01/04/2022); and Tricompartment osteoarthritis of knee (Right) on their pertinent problem list. Pain Assessment: Severity of Chronic pain is reported as a 8 /10. Location: Back (knee) Lower, Left, Right/back pain into right hip and leg. Onset: More than a month ago. Quality: Discomfort, Constant, Aching. Timing: Constant. Modifying factor(s): pain medications help a little bit.. Vitals:  height is '5\' 4"'$  (1.626 m) and weight is 181 lb (82.1 kg). Her temporal temperature is 98.7 F (37.1 C). Her blood pressure is 136/59 (abnormal) and her pulse is 54 (abnormal). Her respiration is 16 and oxygen saturation is 100%.   Reason for encounter: "interventional pain  management therapy due pain of at least four (4) weeks in duration, with failure to respond and/or inability to tolerate more conservative care.  Site Confirmation: Wanda Martinez was asked to confirm the procedure and laterality before marking the site.  Consent: Before the procedure and under the influence of no sedative(s), amnesic(s), or anxiolytics, the patient was informed of the treatment options, risks and possible complications. To fulfill our ethical and legal obligations, as recommended by the American Medical Association's Code of Ethics, I have informed the patient of my clinical impression; the nature and purpose of the treatment or procedure; the risks, benefits, and possible complications of the intervention; the alternatives, including doing nothing; the risk(s) and benefit(s) of the alternative treatment(s) or procedure(s); and the risk(s) and benefit(s) of doing nothing. The patient was provided information about the general risks and possible complications associated with the procedure. These may include, but are not limited to: failure to achieve desired goals, infection, bleeding, organ or nerve damage, allergic reactions, paralysis, and death. In addition, the patient was informed of those risks and complications associated to Spine-related procedures, such as failure to decrease pain; infection (i.e.: Meningitis, epidural or intraspinal abscess); bleeding (i.e.: epidural hematoma, subarachnoid hemorrhage, or any other type of intraspinal or peri-dural bleeding); organ or nerve damage (i.e.: Any type of peripheral nerve, nerve root, or spinal cord injury) with subsequent damage to sensory, motor, and/or autonomic systems, resulting in permanent pain, numbness, and/or weakness of one or several areas of the body; allergic reactions; (i.e.: anaphylactic reaction); and/or death. Furthermore, the patient was informed of those risks and complications associated with the medications. These  include, but are not limited to: allergic reactions (i.e.: anaphylactic or anaphylactoid reaction(s)); adrenal axis suppression; blood sugar elevation that in diabetics may result in ketoacidosis or comma; water retention that in patients with history of congestive heart failure may result in shortness of breath, pulmonary edema, and decompensation with resultant heart failure; weight gain; swelling or edema; medication-induced neural toxicity; particulate matter embolism and blood vessel occlusion with resultant organ, and/or nervous system infarction; and/or aseptic necrosis of one or more joints. Finally, the patient was informed that Medicine is not an exact science; therefore, there is also the possibility of unforeseen or unpredictable risks and/or possible complications that may result in a catastrophic outcome. The patient indicated having understood very clearly. We have given the patient no guarantees and we have made no promises. Enough time was given to the patient to ask questions, all of which were answered to the patient's satisfaction. Wanda Martinez has indicated that she wanted to continue with the procedure. Attestation: I, the ordering provider, attest that I have discussed with the patient the benefits, risks, side-effects, alternatives, likelihood of achieving goals, and potential problems during recovery for the procedure that I have provided informed consent.  Date  Time: 08/15/2022  2:33 PM   Prophylactic antibiotics  Anti-infectives (From admission, onward)    None      Indication(s): None identified   Description of procedure   Start Time: 1538 hrs  Local Anesthesia: Once the patient was positioned, prepped, and time-out was completed. The target area was identified located. The skin was marked with an approved surgical skin marker. Once marked, the skin (epidermis, dermis, and hypodermis), and deeper tissues (fat, connective tissue and muscle) were infiltrated with a small  amount of a short-acting local anesthetic, loaded on a 10cc syringe with a 25G, 1.5-in  Needle. An appropriate amount of time was allowed for local anesthetics to take effect before proceeding to the next step. Local Anesthetic: Lidocaine 1-2% The unused portion of the local anesthetic was discarded in  the proper designated containers. Safety Precautions: Aspiration looking for blood return was conducted prior to all injections. At no point did I inject any substances, as a needle was being advanced. Before injecting, the patient was told to immediately notify me if she was experiencing any new onset of "ringing in the ears, or metallic taste in the mouth". No attempts were made at seeking any paresthesias. Safe injection practices and needle disposal techniques used. Medications properly checked for expiration dates. SDV (single dose vial) medications used. After the completion of the procedure, all disposable equipment used was discarded in the proper designated medical waste containers.  Technical description: Protocol guidelines were followed. After positioning, the target area was identified and prepped in the usual manner. Skin & deeper tissues infiltrated with local anesthetic. Appropriate amount of time allowed to pass for local anesthetics to take effect. Proper needle placement secured. Once satisfactory needle placement was confirmed, I proceeded to inject the desired solution in slow, incremental fashion, intermittently assessing for discomfort or any signs of abnormal or undesired spread of substance. Once completed, the needle was removed and disposed of, as per hospital protocols. The area was cleaned, making sure to leave some of the prepping solution back to take advantage of its long term bactericidal properties.  Aspiration:  Negative          Vitals:   08/15/22 1431  BP: (!) 136/59  Pulse: (!) 54  Resp: 16  Temp: 98.7 F (37.1 C)  TempSrc: Temporal  SpO2: 100%  Weight: 181  lb (82.1 kg)  Height: '5\' 4"'$  (1.626 m)    End Time: 1541 hrs   Imaging guidance  Imaging-assisted Technique: None required. Indication(s): N/A Exposure Time: N/A Contrast: None Fluoroscopic Guidance: N/A Ultrasound Guidance: N/A Interpretation: N/A   Post-op assessment  Post-procedure Vital Signs:  Pulse/HCG Rate: (!) 54  Temp: 98.7 F (37.1 C) Resp: 16 BP: (!) 136/59 SpO2: 100 %  EBL: None  Complications: No immediate post-treatment complications observed by team, or reported by patient.  Note: The patient tolerated the entire procedure well. A repeat set of vitals were taken after the procedure and the patient was kept under observation following institutional policy, for this type of procedure. Post-procedural neurological assessment was performed, showing return to baseline, prior to discharge. The patient was provided with post-procedure discharge instructions, including a section on how to identify potential problems. Should any problems arise concerning this procedure, the patient was given instructions to immediately contact us, at any time, without hesitation. In any case, we plan to contact the patient by telephone for a follow-up status report regarding this interventional procedure.  Comments:  No additional relevant information.   Plan of care  Chronic Opioid Analgesic:  Hydrocodone/APAP 5/325, 1 tab PO q 8 hrs (15 mg/day of hydrocodone) MME/day: 15 mg/day.   Medications administered: We administered lidocaine, methylPREDNISolone acetate, and ropivacaine (PF) 2 mg/mL (0.2%).  Follow-up plan:   Return in about 2 weeks (around 08/29/2022) for procedure (Clinic): (B) L-FCT Blk #6 + (PPE).      Interventional Therapies  Risk  Complexity Considerations:   Estimated body mass index is 30.73 kg/m as calculated from the following:   Height as of this encounter: '5\' 4"'$  (1.626 m).   Weight as of this encounter: 179 lb (81.2 kg). WNL   Planned  Pending:    Therapeutic right L3 and L4 TFESI #1    Under consideration:   Diagnostic right genicular NB  Diagnostic bilateral cervical facet MBB  Completed:   Therapeutic right superior/lateral trapezius muscle TPI x1 (04/19/2021)  Therapeutic right thoracic back TPI x2 (10/17/2018)  (DKFUA)  Therapeutic left thoracic back TPI x1 (10/17/2018)  (DKFUA)  Therapeutic right quadratus lumborum and erector spinae muscle TPI x1 (02/12/2018)  Therapeutic bilateral lumbar facet MBB x5 (08/09/2021) (DKFUA)  Therapeutic right lumbar facet MBB x4 (12/21/2020) (DKFUA)  Therapeutic right lumbar facet RFA x2 (06/04/2018) (100/90/75/75-100)  Therapeutic left lumbar facet RFA x1 (02/12/2018) (100/60/90/>75)  Diagnostic right SI joint Blk x3 (12/21/2020)  (DKFUA)  Diagnostic left SI joint Blk x1 (08/09/2017) (100/90/20/<25)  Therapeutic right SI joint RFA x3 (06/05/2019) (100/100/85/>50)  Therapeutic midline L2-3 LESI x1 (10/04/2017) (100/100/85/>50)  Therapeutic right L3-4 LESI x2 (11/04/2020) (100/100/20/<50)  Therapeutic right L4-5 LESI x2 (03/30/2022) (100/100/75/75)  Therapeutic right L5-S1 LESI x2 (12/04/2019) (100/100/50/75)  Therapeutic right L5 TFESI x4 (02/14/2022) (100/100/70/75)  Therapeutic right S1 TFESI x1 (11/04/2020) (100/100/20/<50)  Diagnostic bilateral superficial trochanteric bursa injec. x1 (04/30/2018) (DKFUA)  Diagnostic right IA hip injection x1 (02/13/2019) (DKFUA)  Diagnostic/therapeutic right IA steroid knee inj. 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)  Therapeutic right ([place]) knee injection x1 (10/06/2021)  (DKFUA)  Therapeutic right ([place]ovisc) knee injection x1 (06/07/2021) (100/100/100/75)    Therapeutic  Palliative (PRN) options:   Therapeutic/palliative lumbar facet RFA  Therapeutic/palliative lumbar facet MBB  Therapeutic left L2 TFESI (this level is responsible for her low  back pain and left lower extremity pain.)        Recent Visits Date Type Provider Dept  07/27/22 Procedure visit Milinda Pointer, MD [facility]  06/26/22 Office Visit Milinda Pointer, MD [facility]  06/12/22 Office Visit Milinda Pointer, MD [facility]  05/30/22 Procedure visit Milinda Pointer, MD [facility]  Showing recent visits within past 90 days and meeting all other requirements Today's Visits Date Type Provider Dept  08/15/22 Office Visit Milinda Pointer, MD [facility]  Showing today's visits and meeting all other requirements Future Appointments Date Type Provider Dept  08/29/22 Appointment Milinda Pointer, MD [facility]  Showing future appointments within next 90 days and meeting all other requirements   Disposition: Discharge home  Discharge (Date  Time): 08/15/2022; 1548 hrs.   Primary Care Physician: Einar Pheasant, MD [facility] Note by: Gaspar Cola, MD Date: 08/15/2022; Time: 6:09 PM  DISCLAIMER: Medicine is not an Chief Strategy Officer. It has no guarantees or warranties. The decision to proceed with this intervention was based on the information collected from the patient. Conclusions were drawn from the patient's questionnaire, interview, and examination. Because information was provided in large part by the patient, it cannot be guaranteed that it has not been purposely or unconsciously manipulated or altered. Every effort has been made to obtain as much accurate, relevant, available data as possible. Always take into account that the treatment will also be dependent on availability of resources and existing treatment guidelines, considered by other Pain Management Specialists as being common knowledge and practice, at the time of the intervention. It is also important to point out that variation in procedural techniques and pharmacological choices  are the acceptable norm. For Medico-Legal review purposes, the indications, contraindications, technique, and results of the these procedures should only be evaluated, judged and interpreted by a Board-Certified Interventional Pain Specialist with extensive familiarity and expertise in the same exact procedure and technique.

## 2022-08-15 NOTE — Patient Instructions (Addendum)
______________________________________________________________________  Preparing for Procedure with Sedation  NOTICE: Due to recent regulatory changes, starting on July 18, 2021, procedures requiring intravenous (IV) sedation will no longer be performed at the [facility].  These types of procedures are required to be performed at [facility] ambulatory surgery facility.  We are very sorry for the inconvenience.  Procedure appointments are limited to planned procedures: No Prescription Refills. No disability issues will be discussed. No medication changes will be discussed.  Instructions: Oral Intake: Do not eat or drink anything for at least 8 hours prior to your procedure. (Exception: Blood Pressure Medication. See below.) Transportation: A driver is required. You may not drive yourself after the procedure. Blood Pressure Medicine: Do not forget to take your blood pressure medicine with a sip of water the morning of the procedure. If your Diastolic (lower reading) is above 100 mmHg, elective cases will be cancelled/rescheduled. Blood thinners: These will need to be stopped for procedures. Notify our staff if you are taking any blood thinners. Depending on which one you take, there will be specific instructions on how and when to stop it. Diabetics on insulin: Notify the staff so that you can be scheduled 1st case in the morning. If your diabetes requires high dose insulin, take only  of your normal insulin dose the morning of the procedure and notify the staff that you have done so. Preventing infections: Shower with an antibacterial soap the morning of your procedure. Build-up your immune system: Take 1000 mg of Vitamin C with every meal (3 times a day) the day prior to your procedure. Antibiotics: Inform the staff if you have a condition or reason that requires you to take antibiotics before dental procedures. Pregnancy: If you are pregnant, call and cancel the procedure. Sickness: If  you have a cold, fever, or any active infections, call and cancel the procedure. Arrival: You must be in the facility at least 30 minutes prior to your scheduled procedure. Children: Do not bring children with you. Dress appropriately: There is always the possibility that your clothing may get soiled. Valuables: Do not bring any jewelry or valuables.  Reasons to call and reschedule or cancel your procedure: (Following these recommendations will minimize the risk of a serious complication.) Surgeries: Avoid having procedures within 2 weeks of any surgery. (Avoid for 2 weeks before or after any surgery). Flu Shots: Avoid having procedures within 2 weeks of a flu shots. (Avoid for 2 weeks before or after immunizations). Barium: Avoid having a procedure within 7-10 days after having had a radiological study involving the use of radiological contrast. (Myelograms, Barium swallow or enema study). Heart attacks: Avoid any elective procedures or surgeries for the initial 6 months after a "Myocardial Infarction" (Heart Attack). Blood thinners: It is imperative that you stop these medications before procedures. Let us know if you if you take any blood thinner.  Infection: Avoid procedures during or within two weeks of an infection (including chest colds or gastrointestinal problems). Symptoms associated with infections include: Localized redness, fever, chills, night sweats or profuse sweating, burning sensation when voiding, cough, congestion, stuffiness, runny nose, sore throat, diarrhea, nausea, vomiting, cold or Flu symptoms, recent or current infections. It is specially important if the infection is over the area that we intend to treat. Heart and lung problems: Symptoms that may suggest an active cardiopulmonary problem include: cough, chest pain, breathing difficulties or shortness of breath, dizziness, ankle swelling, uncontrolled high or unusually low blood pressure, and/or palpitations. If you are    experiencing any of these symptoms, cancel your procedure and contact your primary care physician for an evaluation.  Remember:  Regular Business hours are:  Monday to Thursday 8:00 AM to 4:00 PM  Provider's Schedule: Francisco Naveira, MD:  Procedure days: Tuesday and Thursday 7:30 AM to 4:00 PM  Bilal Lateef, MD:  Procedure days: Monday and Wednesday 7:30 AM to 4:00 PM ______________________________________________________________________  ____________________________________________________________________________________________  General Risks and Possible Complications  Patient Responsibilities: It is important that you read this as it is part of your informed consent. It is our duty to inform you of the risks and possible complications associated with treatments offered to you. It is your responsibility as a patient to read this and to ask questions about anything that is not clear or that you believe was not covered in this document.  Patient's Rights: You have the right to refuse treatment. You also have the right to change your mind, even after initially having agreed to have the treatment done. However, under this last option, if you wait until the last second to change your mind, you may be charged for the materials used up to that point.  Introduction: Medicine is not an exact science. Everything in Medicine, including the lack of treatment(s), carries the potential for danger, harm, or loss (which is by definition: Risk). In Medicine, a complication is a secondary problem, condition, or disease that can aggravate an already existing one. All treatments carry the risk of possible complications. The fact that a side effects or complications occurs, does not imply that the treatment was conducted incorrectly. It must be clearly understood that these can happen even when everything is done following the highest safety standards.  No treatment: You can choose not to proceed with the  proposed treatment alternative. The "PRO(s)" would include: avoiding the risk of complications associated with the therapy. The "CON(s)" would include: not getting any of the treatment benefits. These benefits fall under one of three categories: diagnostic; therapeutic; and/or palliative. Diagnostic benefits include: getting information which can ultimately lead to improvement of the disease or symptom(s). Therapeutic benefits are those associated with the successful treatment of the disease. Finally, palliative benefits are those related to the decrease of the primary symptoms, without necessarily curing the condition (example: decreasing the pain from a flare-up of a chronic condition, such as incurable terminal cancer).  General Risks and Complications: These are associated to most interventional treatments. They can occur alone, or in combination. They fall under one of the following six (6) categories: no benefit or worsening of symptoms; bleeding; infection; nerve damage; allergic reactions; and/or death. No benefits or worsening of symptoms: In Medicine there are no guarantees, only probabilities. No healthcare provider can ever guarantee that a medical treatment will work, they can only state the probability that it may. Furthermore, there is always the possibility that the condition may worsen, either directly, or indirectly, as a consequence of the treatment. Bleeding: This is more common if the patient is taking a blood thinner, either prescription or over the counter (example: Goody Powders, Fish oil, Aspirin, Garlic, etc.), or if suffering a condition associated with impaired coagulation (example: Hemophilia, cirrhosis of the liver, low platelet counts, etc.). However, even if you do not have one on these, it can still happen. If you have any of these conditions, or take one of these drugs, make sure to notify your treating physician. Infection: This is more common in patients with a compromised  immune system, either due to disease (example:   diabetes, cancer, human immunodeficiency virus [HIV], etc.), or due to medications or treatments (example: therapies used to treat cancer and rheumatological diseases). However, even if you do not have one on these, it can still happen. If you have any of these conditions, or take one of these drugs, make sure to notify your treating physician. Nerve Damage: This is more common when the treatment is an invasive one, but it can also happen with the use of medications, such as those used in the treatment of cancer. The damage can occur to small secondary nerves, or to large primary ones, such as those in the spinal cord and brain. This damage may be temporary or permanent and it may lead to impairments that can range from temporary numbness to permanent paralysis and/or brain death. Allergic Reactions: Any time a substance or material comes in contact with our body, there is the possibility of an allergic reaction. These can range from a mild skin rash (contact dermatitis) to a severe systemic reaction (anaphylactic reaction), which can result in death. Death: In general, any medical intervention can result in death, most of the time due to an unforeseen complication. ____________________________________________________________________________________________ ____________________________________________________________________________________________  Post-Procedure Discharge Instructions  Instructions: Apply ice:  Purpose: This will minimize any swelling and discomfort after procedure.  When: Day of procedure, as soon as you get home. How: Fill a plastic sandwich bag with crushed ice. Cover it with a small towel and apply to injection site. How long: (15 min on, 15 min off) Apply for 15 minutes then remove x 15 minutes.  Repeat sequence on day of procedure, until you go to bed. Apply heat:  Purpose: To treat any soreness and discomfort from the  procedure. When: Starting the next day after the procedure. How: Apply heat to procedure site starting the day following the procedure. How long: May continue to repeat daily, until discomfort goes away. Food intake: Start with clear liquids (like water) and advance to regular food, as tolerated.  Physical activities: Keep activities to a minimum for the first 8 hours after the procedure. After that, then as tolerated. Driving: If you have received any sedation, be responsible and do not drive. You are not allowed to drive for 24 hours after having sedation. Blood thinner: (Applies only to those taking blood thinners) You may restart your blood thinner 6 hours after your procedure. Insulin: (Applies only to Diabetic patients taking insulin) As soon as you can eat, you may resume your normal dosing schedule. Infection prevention: Keep procedure site clean and dry. Shower daily and clean area with soap and water. Post-procedure Pain Diary: Extremely important that this be done correctly and accurately. Recorded information will be used to determine the next step in treatment. For the purpose of accuracy, follow these rules: Evaluate only the area treated. Do not report or include pain from an untreated area. For the purpose of this evaluation, ignore all other areas of pain, except for the treated area. After your procedure, avoid taking a long nap and attempting to complete the pain diary after you wake up. Instead, set your alarm clock to go off every hour, on the hour, for the initial 8 hours after the procedure. Document the duration of the numbing medicine, and the relief you are getting from it. Do not go to sleep and attempt to complete it later. It will not be accurate. If you received sedation, it is likely that you were given a medication that may cause amnesia. Because of this, completing the  diary at a later time may cause the information to be inaccurate. This information is needed to plan  your care. Follow-up appointment: Keep your post-procedure follow-up evaluation appointment after the procedure (usually 2 weeks for most procedures, 6 weeks for radiofrequencies). DO NOT FORGET to bring you pain diary with you.   Expect: (What should I expect to see with my procedure?) From numbing medicine (AKA: Local Anesthetics): Numbness or decrease in pain. You may also experience some weakness, which if present, could last for the duration of the local anesthetic. Onset: Full effect within 15 minutes of injected. Duration: It will depend on the type of local anesthetic used. On the average, 1 to 8 hours.  From steroids (Applies only if steroids were used): Decrease in swelling or inflammation. Once inflammation is improved, relief of the pain will follow. Onset of benefits: Depends on the amount of swelling present. The more swelling, the longer it will take for the benefits to be seen. In some cases, up to 10 days. Duration: Steroids will stay in the system x 2 weeks. Duration of benefits will depend on multiple posibilities including persistent irritating factors. Side-effects: If present, they may typically last 2 weeks (the duration of the steroids). Frequent: Cramps (if they occur, drink Gatorade and take over-the-counter Magnesium 450-500 mg once to twice a day); water retention with temporary weight gain; increases in blood sugar; decreased immune system response; increased appetite. Occasional: Facial flushing (red, warm cheeks); mood swings; menstrual changes. Uncommon: Long-term decrease or suppression of natural hormones; bone thinning. (These are more common with higher doses or more frequent use. This is why we prefer that our patients avoid having any injection therapies in other practices.)  Very Rare: Severe mood changes; psychosis; aseptic necrosis. From procedure: Some discomfort is to be expected once the numbing medicine wears off. This should be minimal if ice and heat are  applied as instructed.  Call if: (When should I call?) You experience numbness and weakness that gets worse with time, as opposed to wearing off. New onset bowel or bladder incontinence. (Applies only to procedures done in the spine)  Emergency Numbers: Durning business hours (Monday - Thursday, 8:00 AM - 4:00 PM) (Friday, 9:00 AM - 12:00 Noon): [phone] After hours: [phone] NOTE: If you are having a problem and are unable connect with, or to talk to a provider, then go to your nearest urgent care or emergency department. If the problem is serious and urgent, please call 911. ____________________________________________________________________________________________

## 2022-08-22 ENCOUNTER — Ambulatory Visit
Admission: RE | Admit: 2022-08-22 | Discharge: 2022-08-22 | Disposition: A | Discharge status: Home / Self Care | Payer: Medicare Other | Source: Ambulatory Visit | Attending: Pain Medicine | Admitting: Pain Medicine

## 2022-08-22 DIAGNOSIS — R000 Tachycardia, unspecified: Secondary | ICD-10-CM | POA: Diagnosis not present

## 2022-08-22 DIAGNOSIS — M545 Low back pain, unspecified: Secondary | ICD-10-CM | POA: Insufficient documentation

## 2022-08-22 DIAGNOSIS — M79604 Pain in right leg: Secondary | ICD-10-CM | POA: Insufficient documentation

## 2022-08-22 DIAGNOSIS — G8929 Other chronic pain: Secondary | ICD-10-CM | POA: Diagnosis not present

## 2022-08-22 DIAGNOSIS — M5416 Radiculopathy, lumbar region: Secondary | ICD-10-CM | POA: Diagnosis not present

## 2022-08-22 DIAGNOSIS — E7800 Pure hypercholesterolemia, unspecified: Secondary | ICD-10-CM | POA: Diagnosis not present

## 2022-08-22 DIAGNOSIS — R936 Abnormal findings on diagnostic imaging of limbs: Secondary | ICD-10-CM | POA: Diagnosis not present

## 2022-08-22 DIAGNOSIS — R0602 Shortness of breath: Secondary | ICD-10-CM | POA: Diagnosis not present

## 2022-08-22 DIAGNOSIS — I10 Essential (primary) hypertension: Secondary | ICD-10-CM | POA: Diagnosis not present

## 2022-08-29 ENCOUNTER — Ambulatory Visit
Admission: RE | Admit: 2022-08-29 | Discharge: 2022-08-29 | Disposition: A | Discharge status: Home / Self Care | Payer: Medicare Other | Source: Ambulatory Visit | Attending: Pain Medicine | Admitting: Pain Medicine

## 2022-08-29 ENCOUNTER — Ambulatory Visit: Payer: Medicare Other | Attending: Pain Medicine | Admitting: Pain Medicine

## 2022-08-29 ENCOUNTER — Encounter: Payer: Self-pay | Admitting: Pain Medicine

## 2022-08-29 ENCOUNTER — Other Ambulatory Visit: Payer: Self-pay

## 2022-08-29 VITALS — BP 152/72 | HR 57 | Temp 97.2°F | Resp 18 | Ht 64.0 in | Wt 180.0 lb

## 2022-08-29 DIAGNOSIS — M47816 Spondylosis without myelopathy or radiculopathy, lumbar region: Secondary | ICD-10-CM | POA: Diagnosis not present

## 2022-08-29 DIAGNOSIS — M545 Low back pain, unspecified: Secondary | ICD-10-CM | POA: Insufficient documentation

## 2022-08-29 DIAGNOSIS — M47817 Spondylosis without myelopathy or radiculopathy, lumbosacral region: Secondary | ICD-10-CM | POA: Diagnosis not present

## 2022-08-29 DIAGNOSIS — M1711 Unilateral primary osteoarthritis, right knee: Secondary | ICD-10-CM | POA: Diagnosis not present

## 2022-08-29 DIAGNOSIS — M5137 Other intervertebral disc degeneration, lumbosacral region: Secondary | ICD-10-CM | POA: Insufficient documentation

## 2022-08-29 DIAGNOSIS — G8929 Other chronic pain: Secondary | ICD-10-CM | POA: Insufficient documentation

## 2022-08-29 MED ORDER — LACTATED RINGERS IV SOLN: Freq: Once | INTRAVENOUS | Status: DC

## 2022-08-29 MED ORDER — LIDOCAINE HCL 2 % IJ SOLN
20.0000 mL | Freq: Once | INTRAMUSCULAR | Status: AC
  Administered 2022-08-29: 400 mg

## 2022-08-29 MED ORDER — LIDOCAINE HCL 2 % IJ SOLN
INTRAMUSCULAR | Status: AC
  Filled 2022-08-29: qty 20

## 2022-08-29 MED ORDER — MIDAZOLAM HCL 5 MG/5ML IJ SOLN: 0.5000 mg | Freq: Once | INTRAMUSCULAR | Status: DC

## 2022-08-29 MED ORDER — TRIAMCINOLONE ACETONIDE 40 MG/ML IJ SUSP
INTRAMUSCULAR | Status: AC
  Filled 2022-08-29: qty 2

## 2022-08-29 MED ORDER — ROPIVACAINE HCL 2 MG/ML IJ SOLN
18.0000 mL | Freq: Once | INTRAMUSCULAR | Status: AC
  Administered 2022-08-29: 18 mL via PERINEURAL

## 2022-08-29 MED ORDER — TRIAMCINOLONE ACETONIDE 40 MG/ML IJ SUSP
80.0000 mg | Freq: Once | INTRAMUSCULAR | Status: AC
  Administered 2022-08-29: 80 mg

## 2022-08-29 MED ORDER — ROPIVACAINE HCL 2 MG/ML IJ SOLN
INTRAMUSCULAR | Status: AC
  Filled 2022-08-29: qty 20

## 2022-08-29 MED ORDER — PENTAFLUOROPROP-TETRAFLUOROETH EX AERO
INHALATION_SPRAY | Freq: Once | CUTANEOUS | Status: AC
  Administered 2022-08-29: 30 via TOPICAL

## 2022-08-29 NOTE — Patient Instructions (Addendum)
____________________________________________________________________________________________  Post-Procedure Discharge Instructions  Instructions: Apply ice:  Purpose: This will minimize any swelling and discomfort after procedure.  When: Day of procedure, as soon as you get home. How: Fill a plastic sandwich bag with crushed ice. Cover it with a small towel and apply to injection site. How long: (15 min on, 15 min off) Apply for 15 minutes then remove x 15 minutes.  Repeat sequence on day of procedure, until you go to bed. Apply heat:  Purpose: To treat any soreness and discomfort from the procedure. When: Starting the next day after the procedure. How: Apply heat to procedure site starting the day following the procedure. How long: May continue to repeat daily, until discomfort goes away. Food intake: Start with clear liquids (like water) and advance to regular food, as tolerated.  Physical activities: Keep activities to a minimum for the first 8 hours after the procedure. After that, then as tolerated. Driving: If you have received any sedation, be responsible and do not drive. You are not allowed to drive for 24 hours after having sedation. Blood thinner: (Applies only to those taking blood thinners) You may restart your blood thinner 6 hours after your procedure. Insulin: (Applies only to Diabetic patients taking insulin) As soon as you can eat, you may resume your normal dosing schedule. Infection prevention: Keep procedure site clean and dry. Shower daily and clean area with soap and water. Post-procedure Pain Diary: Extremely important that this be done correctly and accurately. Recorded information will be used to determine the next step in treatment. For the purpose of accuracy, follow these rules: Evaluate only the area treated. Do not report or include pain from an untreated area. For the purpose of this evaluation, ignore all other areas of pain, except for the treated area. After  your procedure, avoid taking a long nap and attempting to complete the pain diary after you wake up. Instead, set your alarm clock to go off every hour, on the hour, for the initial 8 hours after the procedure. Document the duration of the numbing medicine, and the relief you are getting from it. Do not go to sleep and attempt to complete it later. It will not be accurate. If you received sedation, it is likely that you were given a medication that may cause amnesia. Because of this, completing the diary at a later time may cause the information to be inaccurate. This information is needed to plan your care. Follow-up appointment: Keep your post-procedure follow-up evaluation appointment after the procedure (usually 2 weeks for most procedures, 6 weeks for radiofrequencies). DO NOT FORGET to bring you pain diary with you.   Expect: (What should I expect to see with my procedure?) From numbing medicine (AKA: Local Anesthetics): Numbness or decrease in pain. You may also experience some weakness, which if present, could last for the duration of the local anesthetic. Onset: Full effect within 15 minutes of injected. Duration: It will depend on the type of local anesthetic used. On the average, 1 to 8 hours.  From steroids (Applies only if steroids were used): Decrease in swelling or inflammation. Once inflammation is improved, relief of the pain will follow. Onset of benefits: Depends on the amount of swelling present. The more swelling, the longer it will take for the benefits to be seen. In some cases, up to 10 days. Duration: Steroids will stay in the system x 2 weeks. Duration of benefits will depend on multiple posibilities including persistent irritating factors. Side-effects: If present, they  may typically last 2 weeks (the duration of the steroids). Frequent: Cramps (if they occur, drink Gatorade and take over-the-counter Magnesium 450-500 mg once to twice a day); water retention with temporary  weight gain; increases in blood sugar; decreased immune system response; increased appetite. Occasional: Facial flushing (red, warm cheeks); mood swings; menstrual changes. Uncommon: Long-term decrease or suppression of natural hormones; bone thinning. (These are more common with higher doses or more frequent use. This is why we prefer that our patients avoid having any injection therapies in other practices.)  Very Rare: Severe mood changes; psychosis; aseptic necrosis. From procedure: Some discomfort is to be expected once the numbing medicine wears off. This should be minimal if ice and heat are applied as instructed.  Call if: (When should I call?) You experience numbness and weakness that gets worse with time, as opposed to wearing off. New onset bowel or bladder incontinence. (Applies only to procedures done in the spine)  Emergency Numbers: Durning business hours (Monday - Thursday, 8:00 AM - 4:00 PM) (Friday, 9:00 AM - 12:00 Noon): [phone] After hours: [phone] NOTE: If you are having a problem and are unable connect with, or to talk to a provider, then go to your nearest urgent care or emergency department. If the problem is serious and urgent, please call 911. ____________________________________________________________________________________________  Facet Blocks Patient Information  Description: The facets are joints in the spine between the vertebrae.  Like any joints in the body, facets can become irritated and painful.  Arthritis can also effect the facets.  By injecting steroids and local anesthetic in and around these joints, we can temporarily block the nerve supply to them.  Steroids act directly on irritated nerves and tissues to reduce selling and inflammation which often leads to decreased pain.  Facet blocks may be done anywhere along the spine from the neck to the low back depending upon the location of your pain.   After numbing the skin with local anesthetic  (like Novocaine), a small needle is passed onto the facet joints under x-ray guidance.  You may experience a sensation of pressure while this is being done.  The entire block usually lasts about 15-25 minutes.   Conditions which may be treated by facet blocks:  Low back/buttock pain Neck/shoulder pain Certain types of headaches  Preparation for the injection:  Do not eat any solid food or dairy products within 8 hours of your appointment. You may drink clear liquid up to 3 hours before appointment.  Clear liquids include water, black coffee, juice or soda.  No milk or cream please. You may take your regular medication, including pain medications, with a sip of water before your appointment.  Diabetics should hold regular insulin (if taken separately) and take 1/2 normal NPH dose the morning of the procedure.  Carry some sugar containing items with you to your appointment. A driver must accompany you and be prepared to drive you home after your procedure. Bring all your current medications with you. An IV may be inserted and sedation may be given at the discretion of the physician. A blood pressure cuff, EKG and other monitors will often be applied during the procedure.  Some patients may need to have extra oxygen administered for a short period. You will be asked to provide medical information, including your allergies and medications, prior to the procedure.  We must know immediately if you are taking blood thinners (like Coumadin/Warfarin) or if you are allergic to IV iodine contrast (dye).    We must know if you could possible be pregnant.  Possible side-effects:  Bleeding from needle site Infection (rare, may require surgery) Nerve injury (rare) Numbness & tingling (temporary) Difficulty urinating (rare, temporary) Spinal headache (a headache worse with upright posture) Light-headedness (temporary) Pain at injection site (serveral days) Decreased blood pressure (rare,  temporary) Weakness in arm/leg (temporary) Pressure sensation in back/neck (temporary)   Call if you experience:  Fever/chills associated with headache or increased back/neck pain Headache worsened by an upright position New onset, weakness or numbness of an extremity below the injection site Hives or difficulty breathing (go to the emergency room) Inflammation or drainage at the injection site(s) Severe back/neck pain greater than usual New symptoms which are concerning to you  Please note:  Although the local anesthetic injected can often make your back or neck feel good for several hours after the injection, the pain will likely return. It takes 3-7 days for steroids to work.  You may not notice any pain relief for at least one week.  If effective, we will often do a series of 2-3 injections spaced 3-6 weeks apart to maximally decrease your pain.  After the initial series, you may be a candidate for a more permanent nerve block of the facets.  If you have any questions, please call [phone] [facility] 

## 2022-08-29 NOTE — Progress Notes (Signed)
Safety precautions to be maintained throughout the outpatient stay will include: orient to surroundings, keep bed in low position, maintain call bell within reach at all times, provide assistance with transfer out of bed and ambulation.  

## 2022-08-29 NOTE — Progress Notes (Signed)
PROVIDER NOTE: Interpretation of information contained herein should be left to medically-trained personnel. Specific patient instructions are provided elsewhere under "Patient Instructions" section of medical record. This document was created in part using STT-dictation technology, any transcriptional errors that may result from this process are unintentional.  Patient: Wanda Martinez Type: Established DOB: 26-Aug-1935 MRN: [id number] PCP: Einar Pheasant, MD  Service: Procedure DOS: 08/29/2022 Setting: Ambulatory Location: Ambulatory outpatient facility Delivery: Face-to-face Provider: Gaspar Cola, MD Specialty: Interventional Pain Management Specialty designation: 09 Location: Outpatient facility Ref. Prov.: Einar Pheasant, MD    Procedure:           Type: Lumbar Facet, Medial Branch Block(s)  #6   Laterality: Bilateral  Level: L4, L5, & S1 Medial Branch Level(s). Injecting these levels blocks the L5-S1 lumbar facet joints. Imaging: Fluoroscopic guidance         Anesthesia: Local anesthesia (1-2% Lidocaine) Anxiolysis: None  Sedation: No Sedation                       DOS: 08/29/2022 Performed by: Gaspar Cola, MD  Primary Purpose: Diagnostic/Therapeutic Indications: Low back pain severe enough to impact quality of life or function. 1. Lumbar facet syndrome (Bilateral) (R>L)   2. Spondylosis without myelopathy or radiculopathy, lumbosacral region   3. Lumbar facet hypertrophy (Multilevel) (Bilateral)   4. DDD (degenerative disc disease), lumbosacral   5. Chronic low back pain (1ry area of Pain) (Bilateral) (R>L) w/o sciatica   6. Osteoarthritis of knee (Right)    NAS-11 Pain score:   Pre-procedure: 8 /10   Post-procedure: 5 /10     Position / Prep / Materials:  Position: Prone  Prep solution: DuraPrep (Iodine Povacrylex [0.7% available iodine] and Isopropyl Alcohol, 74% w/w) Area Prepped: Posterolateral Lumbosacral Spine (Wide prep: From the lower  border of the scapula down to the end of the tailbone and from flank to flank.)  Materials:  Tray: Block Needle(s):  Type: Spinal  Gauge (G): 22  Length: 5-in Qty: 4     Post-procedure evaluation  Type: Steroid Intra-articular Knee Injection #3  Laterality: Right (-RT) Level/approach: Lateral Imaging guidance: None required (CPT-20610) Anesthesia: Local anesthesia (1-2% Lidocaine) Anxiolysis: None                 Sedation:   None required. No Fentanyl administered..  DOS: 08/15/2022  Performed by: Gaspar Cola, MD  Purpose: Diagnostic/Therapeutic Indications: Knee arthralgia associated to osteoarthritis of the knee 1. Chronic knee pain (Right)   2. Osteoarthritis of knee (Right)   3. Tricompartment osteoarthritis of knee (Right)   4. Abnormal MRI, knee (01/04/2022)    NAS-11 score:   Pre-procedure: 8 /10   Post-procedure: 0-No pain/10   Effectiveness:  Initial hour after procedure: 100 %. Subsequent 4-6 hours post-procedure: 100 %. Analgesia past initial 6 hours: 100 % (patient had to go down on her knee the other day while cleaning.  that irritated the knee on the right.  knee is still improved, just irritated from going down on that knee.). Ongoing improvement:  Analgesic: The patient indicates having attained 100% relief of the pain for the duration of the local anesthetic which persisted until recently when she had to go down on her knees to do some cleaning and this seems to have aggravated that right knee.  However, it is still improved compared to prior to the treatment. Function: [name] reports improvement in function ROM: Ms. Roesler reports improvement in ROM  Pre-op H&P  Assessment:  Wanda Martinez is a 87 y.o. (year old), female patient, seen today for interventional treatment. She  has a past surgical history that includes Tonsillectomy and adenoidectomy (81); Vesicovaginal fistula closure w/ TAH (1983); Breast surgery (1986); Breast enhancement surgery  (1987); Breast implant removal; Breast implant removal (Right, 08/29/2012); Mastectomy (08/2012); Abdominal hysterectomy; Colonoscopy with propofol (N/A, 09/13/2016); and Breast biopsy (2013). Wanda Martinez has a current medication list which includes the following prescription(s): amlodipine, aspirin ec, calcium carbonate, citalopram, cyclobenzaprine, donepezil, gabapentin, glucosamine-chondroitin, hydrochlorothiazide, hydrocodone-acetaminophen, letrozole, losartan, lubiprostone, magnesium oxide, melatonin, meloxicam, metoprolol succinate, multivitamin, nitrofurantoin (macrocrystal-monohydrate), omega-3, omeprazole, pantoprazole, rosuvastatin, donepezil, glucosamine sulfate, hydrocodone-acetaminophen, and hydrocodone-acetaminophen, and the following Facility-Administered Medications: lactated ringers and midazolam. Her primarily concern today is the Back Pain (Bilateral lumbar )  Initial Vital Signs:  Pulse/HCG Rate: (!) 57ECG Heart Rate: (!) 58 Temp: (!) 97.2 F (36.2 C) Resp: 16 BP: 139/65 SpO2: 100 %  BMI: Estimated body mass index is 30.9 kg/m as calculated from the following:   Height as of this encounter: '5\' 4"'$  (1.626 m).   Weight as of this encounter: 180 lb (81.6 kg).  Risk Assessment: Allergies: Reviewed. She is allergic to sulfa antibiotics and vesicare [solifenacin].  Allergy Precautions: None required Coagulopathies: Reviewed. None identified.  Blood-thinner therapy: None at this time Active Infection(s): Reviewed. None identified. Wanda Martinez is afebrile  Site Confirmation: Wanda Martinez was asked to confirm the procedure and laterality before marking the site Procedure checklist: Completed Consent: Before the procedure and under the influence of no sedative(s), amnesic(s), or anxiolytics, the patient was informed of the treatment options, risks and possible complications. To fulfill our ethical and legal obligations, as recommended by the American Medical Association's Code of Ethics, I  have informed the patient of my clinical impression; the nature and purpose of the treatment or procedure; the risks, benefits, and possible complications of the intervention; the alternatives, including doing nothing; the risk(s) and benefit(s) of the alternative treatment(s) or procedure(s); and the risk(s) and benefit(s) of doing nothing. The patient was provided information about the general risks and possible complications associated with the procedure. These may include, but are not limited to: failure to achieve desired goals, infection, bleeding, organ or nerve damage, allergic reactions, paralysis, and death. In addition, the patient was informed of those risks and complications associated to Spine-related procedures, such as failure to decrease pain; infection (i.e.: Meningitis, epidural or intraspinal abscess); bleeding (i.e.: epidural hematoma, subarachnoid hemorrhage, or any other type of intraspinal or peri-dural bleeding); organ or nerve damage (i.e.: Any type of peripheral nerve, nerve root, or spinal cord injury) with subsequent damage to sensory, motor, and/or autonomic systems, resulting in permanent pain, numbness, and/or weakness of one or several areas of the body; allergic reactions; (i.e.: anaphylactic reaction); and/or death. Furthermore, the patient was informed of those risks and complications associated with the medications. These include, but are not limited to: allergic reactions (i.e.: anaphylactic or anaphylactoid reaction(s)); adrenal axis suppression; blood sugar elevation that in diabetics may result in ketoacidosis or comma; water retention that in patients with history of congestive heart failure may result in shortness of breath, pulmonary edema, and decompensation with resultant heart failure; weight gain; swelling or edema; medication-induced neural toxicity; particulate matter embolism and blood vessel occlusion with resultant organ, and/or nervous system infarction;  and/or aseptic necrosis of one or more joints. Finally, the patient was informed that Medicine is not an exact science; therefore, there is also the possibility of unforeseen or unpredictable risks and/or possible  complications that may result in a catastrophic outcome. The patient indicated having understood very clearly. We have given the patient no guarantees and we have made no promises. Enough time was given to the patient to ask questions, all of which were answered to the patient's satisfaction. Ms. Nugent has indicated that she wanted to continue with the procedure. Attestation: I, the ordering provider, attest that I have discussed with the patient the benefits, risks, side-effects, alternatives, likelihood of achieving goals, and potential problems during recovery for the procedure that I have provided informed consent. Date  Time: 08/29/2022 11:18 AM  Pre-Procedure Preparation:  Monitoring: As per clinic protocol. Respiration, ETCO2, SpO2, BP, heart rate and rhythm monitor placed and checked for adequate function Safety Precautions: Patient was assessed for positional comfort and pressure points before starting the procedure. Time-out: I initiated and conducted the "Time-out" before starting the procedure, as per protocol. The patient was asked to participate by confirming the accuracy of the "Time Out" information. Verification of the correct person, site, and procedure were performed and confirmed by me, the nursing staff, and the patient. "Time-out" conducted as per Joint Commission's Universal Protocol (UP.01.01.01). Time: 1138  Description of Procedure:          Laterality: Bilateral. The procedure was performed in identical fashion on both sides. Targeted Levels:  L4, L5, & S1 Medial Branch Level(s)  Safety Precautions: Aspiration looking for blood return was conducted prior to all injections. At no point did we inject any substances, as a needle was being advanced. Before injecting,  the patient was told to immediately notify me if she was experiencing any new onset of "ringing in the ears, or metallic taste in the mouth". No attempts were made at seeking any paresthesias. Safe injection practices and needle disposal techniques used. Medications properly checked for expiration dates. SDV (single dose vial) medications used. After the completion of the procedure, all disposable equipment used was discarded in the proper designated medical waste containers. Local Anesthesia: Protocol guidelines were followed. The patient was positioned over the fluoroscopy table. The area was prepped in the usual manner. The time-out was completed. The target area was identified using fluoroscopy. A 12-in long, straight, sterile hemostat was used with fluoroscopic guidance to locate the targets for each level blocked. Once located, the skin was marked with an approved surgical skin marker. Once all sites were marked, the skin (epidermis, dermis, and hypodermis), as well as deeper tissues (fat, connective tissue and muscle) were infiltrated with a small amount of a short-acting local anesthetic, loaded on a 10cc syringe with a 25G, 1.5-in  Needle. An appropriate amount of time was allowed for local anesthetics to take effect before proceeding to the next step. Local Anesthetic: Lidocaine 2.0% The unused portion of the local anesthetic was discarded in the proper designated containers. Technical description of process:   L4 Medial Branch Nerve Block (MBB): The target area for the L4 medial branch is at the junction of the postero-lateral aspect of the superior articular process and the superior, posterior, and medial edge of the transverse process of L5. Under fluoroscopic guidance, a Quincke needle was inserted until contact was made with os over the superior postero-lateral aspect of the pedicular shadow (target area). After negative aspiration for blood, 0.5 mL of the nerve block solution was injected  without difficulty or complication. The needle was removed intact. L5 Medial Branch Nerve Block (MBB): The target area for the L5 medial branch is at the junction of the postero-lateral aspect  of the superior articular process and the superior, posterior, and medial edge of the sacral ala. Under fluoroscopic guidance, a Quincke needle was inserted until contact was made with os over the superior postero-lateral aspect of the pedicular shadow (target area). After negative aspiration for blood, 0.5 mL of the nerve block solution was injected without difficulty or complication. The needle was removed intact. S1 Medial Branch Nerve Block (MBB): The target area for the S1 medial branch is at the posterior and inferior 6 o'clock position of the L5-S1 facet joint. Under fluoroscopic guidance, the Quincke needle inserted for the L5 MBB was redirected until contact was made with os over the inferior and postero aspect of the sacrum, at the 6 o' clock position under the L5-S1 facet joint (Target area). After negative aspiration for blood, 0.5 mL of the nerve block solution was injected without difficulty or complication. The needle was removed intact.  Once the entire procedure was completed, the treated area was cleaned, making sure to leave some of the prepping solution back to take advantage of its long term bactericidal properties.         Illustration of the posterior view of the lumbar spine and the posterior neural structures. Laminae of L2 through S1 are labeled. DPRL5, dorsal primary ramus of L5; DPRS1, dorsal primary ramus of S1; DPR3, dorsal primary ramus of L3; FJ, facet (zygapophyseal) joint L3-L4; I, inferior articular process of L4; LB1, lateral branch of dorsal primary ramus of L1; IAB, inferior articular branches from L3 medial branch (supplies L4-L5 facet joint); IBP, intermediate branch plexus; MB3, medial branch of dorsal primary ramus of L3; NR3, third lumbar nerve root; S, superior articular  process of L5; SAB, superior articular branches from L4 (supplies L4-5 facet joint also); TP3, transverse process of L3.  Vitals:   08/29/22 1118 08/29/22 1132 08/29/22 1136 08/29/22 1142  BP: 139/65 (!) 153/75 (!) 156/76 (!) 152/72  Pulse: (!) 57     Resp: '16 18 18 18  '$ Temp: (!) 97.2 F (36.2 C)     TempSrc: Temporal     SpO2: 100% 99% 100% 100%  Weight: 180 lb (81.6 kg)     Height: '5\' 4"'$  (1.626 m)        Start Time: 1138 hrs. End Time: 1142 hrs.  Imaging Guidance (Spinal):          Type of Imaging Technique: Fluoroscopy Guidance (Spinal) Indication(s): Assistance in needle guidance and placement for procedures requiring needle placement in or near specific anatomical locations not easily accessible without such assistance. Exposure Time: Please see nurses notes. Contrast: None used. Fluoroscopic Guidance: I was personally present during the use of fluoroscopy. "Tunnel Vision Technique" used to obtain the best possible view of the target area. Parallax error corrected before commencing the procedure. "Direction-depth-direction" technique used to introduce the needle under continuous pulsed fluoroscopy. Once target was reached, antero-posterior, oblique, and lateral fluoroscopic projection used confirm needle placement in all planes. Images permanently stored in EMR. Interpretation: No contrast injected. I personally interpreted the imaging intraoperatively. Adequate needle placement confirmed in multiple planes. Permanent images saved into the patient's record.  Antibiotic Prophylaxis:   Anti-infectives (From admission, onward)    None      Indication(s): None identified  Post-operative Assessment:  Post-procedure Vital Signs:  Pulse/HCG Rate: (!) 57(!) 56 Temp: (!) 97.2 F (36.2 C) Resp: 18 BP: (!) 152/72 SpO2: 100 %  EBL: None  Complications: No immediate post-treatment complications observed by team, or reported by patient.  Note: The patient tolerated the entire  procedure well. A repeat set of vitals were taken after the procedure and the patient was kept under observation following institutional policy, for this type of procedure. Post-procedural neurological assessment was performed, showing return to baseline, prior to discharge. The patient was provided with post-procedure discharge instructions, including a section on how to identify potential problems. Should any problems arise concerning this procedure, the patient was given instructions to immediately contact us, at any time, without hesitation. In any case, we plan to contact the patient by telephone for a follow-up status report regarding this interventional procedure.  Comments:  No additional relevant information.  Plan of Care  Orders:  Orders Placed This Encounter  Procedures   LUMBAR FACET(MEDIAL BRANCH NERVE BLOCK) MBNB    Scheduling Instructions:     Procedure: Lumbar facet block (AKA.: Lumbosacral medial branch nerve block)     Side: Bilateral     Level: L3-4 & L5-S1 Facets (L2, L3, L4, L5, & S1 Medial Branch Nerves)     Sedation: Patient's choice.     Timeframe: Today    Order Specific Question:   Where will this procedure be performed?    Answer:   [facility]   DG PA[facility] C-ARM 1-60 MIN NO REPORT    Intraoperative interpretation by procedural physician at [place].    Standing Status:   Standing    Number of Occurrences:   1    Order Specific Question:   Reason for exam:    Answer:   Assistance in needle guidance and placement for procedures requiring needle placement in or near specific anatomical locations not easily accessible without such assistance.   Informed Consent Details: Physician/Practitioner Attestation; Transcribe to consent form and obtain patient signature    Nursing Order: Transcribe to consent form and obtain patient signature. Note: Always confirm laterality of pain with Ms. Ermalinda Barrios, before procedure.    Order Specific Question:    Physician/Practitioner attestation of informed consent for procedure/surgical case    Answer:   I, the physician/practitioner, attest that I have discussed with the patient the benefits, risks, side effects, alternatives, likelihood of achieving goals and potential problems during recovery for the procedure that I have provided informed consent.    Order Specific Question:   Procedure    Answer:   Lumbar Facet Block  under fluoroscopic guidance    Order Specific Question:   Physician/Practitioner performing the procedure    Answer:   Dracen Reigle A. Dossie Arbour MD    Order Specific Question:   Indication/Reason    Answer:   Low Back Pain, with our without leg pain, due to Facet Joint Arthralgia (Joint Pain) Spondylosis (Arthritis of the Spine), without myelopathy or radiculopathy (Nerve Damage).   Provide equipment / supplies at bedside    "Block Tray" (Disposable  single use) Needle type: SpinalSpinal Amount/quantity: 4 Size: Medium (5-inch) Gauge: 22G    Standing Status:   Standing    Number of Occurrences:   1    Order Specific Question:   Specify    Answer:   Block Tray   Nursing Instructions:    Please complete this patient's postprocedure evaluation.    Scheduling Instructions:     Please complete this patient's postprocedure evaluation.   Chronic Opioid Analgesic:  Hydrocodone/APAP 5/325, 1 tab PO q 8 hrs (15 mg/day of hydrocodone) MME/day: 15 mg/day.   Medications ordered for procedure: Meds ordered this encounter  Medications   lidocaine (XYLOCAINE) 2 % (  with pres) injection 400 mg   pentafluoroprop-tetrafluoroeth (GEBAUERS) aerosol   lactated ringers infusion   midazolam (VERSED) 5 MG/5ML injection 0.5-2 mg    Make sure Flumazenil is available in the pyxis when using this medication. If oversedation occurs, administer 0.2 mg IV over 15 sec. If after 45 sec no response, administer 0.2 mg again over 1 min; may repeat at 1 min intervals; not to exceed 4 doses (1 mg)   ropivacaine  (PF) 2 mg/mL (0.2%) (NAROPIN) injection 18 mL   triamcinolone acetonide (KENALOG-40) injection 80 mg   Medications administered: We administered lidocaine, pentafluoroprop-tetrafluoroeth, ropivacaine (PF) 2 mg/mL (0.2%), and triamcinolone acetonide.  See the medical record for exact dosing, route, and time of administration.  Follow-up plan:   Return in about 2 weeks (around 09/12/2022) for Proc-day (T,Th), (VV), (PPE).       Interventional Therapies  Risk  Complexity Considerations:   Estimated body mass index is 30.73 kg/m as calculated from the following:   Height as of this encounter: '5\' 4"'$  (1.626 m).   Weight as of this encounter: 179 lb (81.2 kg). WNL   Planned  Pending:   Therapeutic right L3 and L4 TFESI #1    Under consideration:   Diagnostic right genicular NB  Diagnostic bilateral cervical facet MBB    Completed:   Therapeutic right superior/lateral trapezius muscle TPI x1 (04/19/2021)  Therapeutic right thoracic back TPI x2 (10/17/2018) (DKFUA)  Therapeutic left thoracic back TPI x1 (10/17/2018) (DKFUA)  Therapeutic right quadratus lumborum and erector spinae muscle TPI x1 (02/12/2018)  Therapeutic bilateral lumbar facet MBB (L2-S1) (FCT: L3-4,L4-5,L5-S1) x5 (08/09/2021) (100/100/50)  Therapeutic bilateral lumbar facet MBB (L4-S1) (FCT: L5-S1) x1 (08/29/2022)  Therapeutic right lumbar facet MBB (L2-S1) x4 (12/21/2020) (DKFUA)  Therapeutic right lumbar facet RFA x2 (06/04/2018) (100/90/75/75-100)  Therapeutic left lumbar facet RFA x1 (02/12/2018) (100/60/90/>75)  Diagnostic right SI joint Blk x3 (12/21/2020)  (DKFUA)  Diagnostic left SI joint Blk x1 (08/09/2017) (100/90/20/<25)  Therapeutic right SI joint RFA x3 (06/05/2019) (100/100/85/>50)  Therapeutic midline L2-3 LESI x1 (10/04/2017) (100/100/85/>50)  Therapeutic right L3-4 LESI x2 (11/04/2020) (100/100/20/<50)  Therapeutic right L4-5 LESI x2 (03/30/2022) (100/100/75/75)  Therapeutic right L5-S1 LESI x2 (12/04/2019)  (100/100/50/75)  Diagnostic/therapeutic right L3 and L4 TFESI x1 (07/27/2022) (100/100/75/50)  Therapeutic right L5 TFESI x4 (02/14/2022) (100/100/70/75)  Therapeutic right S1 TFESI x1 (11/04/2020) (100/100/20/<50)  Diagnostic bilateral superficial trochanteric bursa injec. x1 (04/30/2018) (DKFUA)  Diagnostic right IA hip injection x1 (02/13/2019) (DKFUA)  Diagnostic/therapeutic right IA steroid knee inj. x3 (08/15/2022) (100/100/100/80)  Therapeutic right (Zilretta) knee injection x1 (10/06/2021)  (DKFUA)  Therapeutic right (Monovisc) knee injection x1 (06/07/2021) (100/100/100/75)    Therapeutic  Palliative (PRN) options:   Therapeutic/palliative lumbar facet RFA  Therapeutic/palliative lumbar facet MBB  Therapeutic left L2 TFESI (this level is responsible for her low back pain and left lower extremity pain.)    Recent Visits Date Type Provider Dept  08/15/22 Office Visit Milinda Pointer, MD [facility]  07/27/22 Procedure visit Milinda Pointer, MD [facility]  06/26/22 Office Visit Milinda Pointer, MD [facility]  06/12/22 Office Visit Milinda Pointer, MD [facility]  Showing recent visits within past 90 days and meeting all other requirements Today's Visits Date Type Provider Dept  08/29/22 Procedure visit Milinda Pointer, MD [facility]  Showing today's visits and meeting all other requirements Future Appointments Date Type Provider Dept  09/12/22 Appointment Milinda Pointer, MD [facility]  Showing future appointments within next 90 days  and meeting all other requirements  Disposition: Discharge home  Discharge (Date  Time): 08/29/2022; 1152 hrs.   Primary Care Physician: Einar Pheasant, MD Location: [facility] Note by: Gaspar Cola, MD Date: 08/29/2022; Time: 12:34 PM  Disclaimer:  Medicine is not an Chief Strategy Officer. The only guarantee in medicine is that nothing  is guaranteed. It is important to note that the decision to proceed with this intervention was based on the information collected from the patient. The Data and conclusions were drawn from the patient's questionnaire, the interview, and the physical examination. Because the information was provided in large part by the patient, it cannot be guaranteed that it has not been purposely or unconsciously manipulated. Every effort has been made to obtain as much relevant data as possible for this evaluation. It is important to note that the conclusions that lead to this procedure are derived in large part from the available data. Always take into account that the treatment will also be dependent on availability of resources and existing treatment guidelines, considered by other Pain Management Practitioners as being common knowledge and practice, at the time of the intervention. For Medico-Legal purposes, it is also important to point out that variation in procedural techniques and pharmacological choices are the acceptable norm. The indications, contraindications, technique, and results of the above procedure should only be interpreted and judged by a Board-Certified Interventional Pain Specialist with extensive familiarity and expertise in the same exact procedure and technique.

## 2022-08-30 ENCOUNTER — Telehealth: Payer: Self-pay

## 2022-08-30 NOTE — Telephone Encounter (Signed)
Post procedure phone call.  LM 

## 2022-09-08 ENCOUNTER — Other Ambulatory Visit: Payer: Self-pay

## 2022-09-08 MED ORDER — AMLODIPINE BESYLATE 2.5 MG PO TABS: 2.5000 mg | ORAL_TABLET | Freq: Every day | ORAL | 0 refills | Status: DC

## 2022-09-12 ENCOUNTER — Ambulatory Visit: Payer: Medicare Other | Attending: Pain Medicine | Admitting: Pain Medicine

## 2022-09-12 DIAGNOSIS — G8929 Other chronic pain: Secondary | ICD-10-CM | POA: Diagnosis not present

## 2022-09-12 DIAGNOSIS — M5137 Other intervertebral disc degeneration, lumbosacral region: Secondary | ICD-10-CM

## 2022-09-12 DIAGNOSIS — M47816 Spondylosis without myelopathy or radiculopathy, lumbar region: Secondary | ICD-10-CM | POA: Diagnosis not present

## 2022-09-12 DIAGNOSIS — M47817 Spondylosis without myelopathy or radiculopathy, lumbosacral region: Secondary | ICD-10-CM | POA: Diagnosis not present

## 2022-09-12 DIAGNOSIS — M545 Low back pain, unspecified: Secondary | ICD-10-CM | POA: Diagnosis not present

## 2022-09-12 NOTE — Progress Notes (Signed)
Patient: Wanda Martinez  Service Category: E/M  Provider: Gaspar Cola, MD  DOB: 05/30/1935  DOS: 09/12/2022  Location: Office  MRN: [id number]  Setting: Ambulatory outpatient  Referring Provider: Einar Pheasant, MD  Type: Established Patient  Specialty: Interventional Pain Management  PCP: Einar Pheasant, MD  Location: Remote location  Delivery: TeleHealth     Virtual Encounter - Pain Management PROVIDER NOTE: Information contained herein reflects review and annotations entered in association with encounter. Interpretation of such information and data should be left to medically-trained personnel. Information provided to patient can be located elsewhere in the medical record under "Patient Instructions". Document created using STT-dictation technology, any transcriptional errors that may result from process are unintentional.    Contact & Pharmacy Preferred: [phone] Home: [phone] (home) Mobile: [phone] (mobile) E-mail: No e-mail address on record  [place], [place] - [address] [place] [address] Phone: [phone] Fax: [phone]   Pre-screening  Wanda Martinez offered "in-person" vs "virtual" encounter. She indicated preferring virtual for this encounter.   Reason COVID-19*  Social distancing based on CDC and AMA recommendations.   I contacted Wanda Martinez on 09/12/2022 via telephone.      I clearly identified myself as Gaspar Cola, MD. I verified that I was speaking with the correct person using two identifiers (Name: Wanda Martinez, and date of birth: 02-Oct-1935).  Consent I sought verbal advanced consent from Wanda Martinez for virtual visit interactions. I informed Wanda Martinez of possible security and privacy concerns, risks, and limitations associated with providing "not-in-person" medical evaluation and management services. I also informed Wanda Martinez of the availability of  "in-person" appointments. Finally, I informed her that there would be a charge for the virtual visit and that she could be  personally, fully or partially, financially responsible for it. [name] expressed understanding and agreed to proceed.   Historic Elements   Wanda Martinez is a 87 y.o. year old, female patient evaluated today after our last contact on 08/29/2022. Wanda Martinez  has a past medical history of Acute postoperative pain (10/25/2017), Anemia, Arm pain (07/26/2015), Arthritis, Arthritis, degenerative (03/26/2014), Back pain (11/01/2013), Bladder infection (06/2018), Breast cancer ([place]), Breast cancer ([place]), CHEST PAIN (04/29/2010), Chronic cystitis, Cystocele (02/22/2013), Cystocele, midline (08/19/2013), Degeneration of intervertebral disc of lumbosacral region (03/26/2014), DYSPNEA (04/29/2010), Enthesopathy of hip (03/26/2014), GERD (gastroesophageal reflux disease), Hiatal hernia, HTN (hypertension), Hypokalemia (06/2018), Hyponatremia (06/2018), LBP (low back pain) (03/26/2014), Neck pain (11/01/2013), Parkinson disease ([place]), Pneumonia (06/2018), Sinusitis (02/07/2015), Skin lesions (07/12/2014), and Urinary incontinence. She also  has a past surgical history that includes Tonsillectomy and adenoidectomy (81); Vesicovaginal fistula closure w/ TAH (1983); Breast surgery (1986); Breast enhancement surgery (1987); Breast implant removal; Breast implant removal (Right, 08/29/2012); Mastectomy (08/2012); Abdominal hysterectomy; Colonoscopy with propofol (N/A, 09/13/2016); and Breast biopsy (2013). Wanda Martinez has a current medication list which includes the following prescription(s): amlodipine, aspirin ec, calcium carbonate, citalopram, cyclobenzaprine, donepezil, donepezil, gabapentin, glucosamine sulfate, glucosamine-chondroitin, hydrochlorothiazide, hydrocodone-acetaminophen, letrozole, losartan, lubiprostone, magnesium oxide, melatonin, meloxicam, metoprolol succinate, multivitamin, nitrofurantoin  (macrocrystal-monohydrate), omega-3, omeprazole, pantoprazole, rosuvastatin, hydrocodone-acetaminophen, and hydrocodone-acetaminophen. She  reports that she has never smoked. She has never used smokeless tobacco. She reports that she does not drink alcohol and does not use drugs. Wanda Martinez is allergic to sulfa antibiotics and vesicare [solifenacin].   HPI  Today, she is being contacted for a post-procedure assessment.  The diagnostic injection has confirmed that the radiofrequency ablations done in  2019 have completely worn off and she needs to have them repeated.  Currently her worst and only pain is on the right side since she refers that after the lumbar facet block the left side pain went away and has not returned yet.  Post-procedure evaluation   Type: Lumbar Facet, Medial Branch Block(s) #6   Laterality: Bilateral  Level: L4, L5, & S1 Medial Branch Level(s). Injecting these levels blocks the L5-S1 lumbar facet joints. Imaging: Fluoroscopic guidance         Anesthesia: Local anesthesia (1-2% Lidocaine) Anxiolysis: None  Sedation: No Sedation                       DOS: 08/29/2022 Performed by: Oswaldo Done, MD  Primary Purpose: Diagnostic/Therapeutic Indications: Low back pain severe enough to impact quality of life or function. 1. Lumbar facet syndrome (Bilateral) (R>L)   2. Spondylosis without myelopathy or radiculopathy, lumbosacral region   3. Lumbar facet hypertrophy (Multilevel) (Bilateral)   4. DDD (degenerative disc disease), lumbosacral   5. Chronic low back pain (1ry area of Pain) (Bilateral) (R>L) w/o sciatica   6. Osteoarthritis of knee (Right)    NAS-11 Pain score:   Pre-procedure: 8 /10   Post-procedure: 5 /10      Effectiveness:  Initial hour after procedure: 100 %. Subsequent 4-6 hours post-procedure: 100 %. Analgesia past initial 6 hours: 100 % (for only 3 days). Ongoing improvement:  Analgesic: The patient indicates that the left side is doing great,  but the pain on the right side has returned.  MRI that she had done on 08/22/2022 indicates that she has right greater than left facet joint arthropathy affecting the L5-S1 region.  The patient indicates that during the time that the numbing medicine was in place, both her low back pain and leg pain went away. Function: Wanda Martinez reports improvement in function on the left side, but not the right. ROM: Wanda Martinez reports improvement in ROM on the left side, but none on the right.  Pharmacotherapy Assessment   Opioid Analgesic: Hydrocodone/APAP 5/325, 1 tab PO q 8 hrs (15 mg/day of hydrocodone) MME/day: 15 mg/day.   Monitoring: [place] PMP: PDMP reviewed during this encounter.       Pharmacotherapy: No side-effects or adverse reactions reported. Compliance: No problems identified. Effectiveness: Clinically acceptable. Plan: Refer to "POC". UDS:  Summary  Date Value Ref Range Status  06/12/2022 Note  Final    Comment:    ==================================================================== [employer] Select 13 (MW) ==================================================================== Test                             Result       Flag       Units  Drug Present and Declared for Prescription Verification   Hydrocodone                    1766         EXPECTED   ng/mg creat   Dihydrocodeine                 303          EXPECTED   ng/mg creat   Norhydrocodone                 1597         EXPECTED   ng/mg creat    Sources of hydrocodone include scheduled prescription medications.  Dihydrocodeine and norhydrocodone are expected metabolites of    hydrocodone. Dihydrocodeine is also available as a scheduled    prescription medication.  ==================================================================== Test                      Result    Flag   Units      Ref Range   Creatinine              38               mg/dL       >=20 ==================================================================== Declared Medications:  The flagging and interpretation on this report are based on the  following declared medications.  Unexpected results may arise from  inaccuracies in the declared medications.   **Note: The testing scope of this panel includes these medications:   Hydrocodone (Norco)   **Note: The testing scope of this panel does not include the  following reported medications:   Acetaminophen (Norco)  Amlodipine  Aspirin  Calcium  Chondroitin  Citalopram (Celexa)  Cyclobenzaprine (Flexeril)  Donepezil (Aricept)  Gabapentin (Neurontin)  Glucosamine  Letrozole (Femara)  Losartan (Cozaar)  Lubiprostone (Amitiza)  Magnesium (Mag-Ox)  Melatonin  Meloxicam (Mobic)  Metoprolol  Multivitamin  Nitrofurantoin (Macrobid)  Omega-3 Fatty Acids  Omeprazole  Pantoprazole (Protonix)  Rosuvastatin (Crestor) ==================================================================== For clinical consultation, please call [phone]. ====================================================================    No results found for: "CBDTHCR", "D8THCCBX", "D9THCCBX"   Laboratory Chemistry Profile   Renal Lab Results  Component Value Date   BUN 24 (H) 05/09/2022   CREATININE 1.15 (H) 05/09/2022   BCR 20 06/11/2020   GFR 29.45 (L) 03/17/2022   GFRAA >60 07/27/2020   GFRNONAA 47 (L) 05/09/2022    Hepatic Lab Results  Component Value Date   AST 27 03/07/2022   ALT 18 03/07/2022   ALBUMIN 4.3 03/07/2022   ALKPHOS 45 03/07/2022    Electrolytes Lab Results  Component Value Date   NA 135 05/09/2022   K 4.0 05/09/2022   CL 99 05/09/2022   CALCIUM 9.2 05/09/2022   MG 2.2 07/17/2018    Bone Lab Results  Component Value Date   25OHVITD1 42 07/17/2018   25OHVITD2 <1.0 07/17/2018   25OHVITD3 42 07/17/2018    Inflammation (CRP: Acute Phase) (ESR: Chronic Phase) Lab Results  Component Value Date   CRP  3 07/17/2018   ESRSEDRATE 27 07/17/2018         Note: Above Lab results reviewed.  Imaging  DG PAIN CLINIC C-ARM 1-60 MIN NO REPORT Fluoro was used, but no Radiologist interpretation will be provided.  Please refer to "NOTES" tab for provider progress note.  Assessment  The primary encounter diagnosis was Lumbar facet syndrome (Bilateral) (R>L). Diagnoses of Chronic low back pain (1ry area of Pain) (Bilateral) (R>L) w/o sciatica, Spondylosis without myelopathy or radiculopathy, lumbosacral region, Lumbar facet hypertrophy (Multilevel) (Bilateral), and DDD (degenerative disc disease), lumbosacral were also pertinent to this visit.  Plan of Care  Problem-specific:  No problem-specific Assessment & Plan notes found for this encounter.  Wanda Martinez has a current medication list which includes the following long-term medication(s): amlodipine, calcium carbonate, citalopram, cyclobenzaprine, donepezil, donepezil, gabapentin, hydrochlorothiazide, hydrocodone-acetaminophen, losartan, magnesium oxide, meloxicam, metoprolol succinate, omeprazole, pantoprazole, rosuvastatin, hydrocodone-acetaminophen, and hydrocodone-acetaminophen.  Pharmacotherapy (Medications Ordered): No orders of the defined types were placed in this encounter.  Orders:  Orders Placed This Encounter  Procedures   Radiofrequency,Lumbar    Standing Status:   Future    Standing  Expiration Date:   12/12/2022    Scheduling Instructions:     Side(s): Right-sided     Level: L5-S1 Facets (L4, L5, & S1 Medial Branch Nerves)     Sedation: With Sedation.     Scheduling Timeframe: As soon as pre-approved    Order Specific Question:   Where will this procedure be performed?    Answer:   [facility]   Follow-up plan:   Return for (73min), procedure (ECT): (R) L-FCT RFA #3.     Interventional Therapies  Risk  Complexity Considerations:   Estimated body mass index is 30.73 kg/m as calculated from the  following:   Height as of this encounter: $RemoveBeforeD'5\' 4"'evSnCETkaznfXb$  (1.626 m).   Weight as of this encounter: 179 lb (81.2 kg). WNL   Planned  Pending:   Therapeutic right lumbar facet (L4-S1) RFA #3    Under consideration:   Diagnostic right genicular NB  Diagnostic bilateral cervical facet MBB  Therapeutic right L3 and L4 TFESI #1    Completed:   Therapeutic right superior/lateral trapezius muscle TPI x1 (04/19/2021)  Therapeutic right thoracic back TPI x2 (10/17/2018) (DKFUA)  Therapeutic left thoracic back TPI x1 (10/17/2018) (DKFUA)  Therapeutic right quadratus lumborum and erector spinae muscle TPI x1 (02/12/2018)  Therapeutic bilateral lumbar facet MBB (L2-S1) (FCT: L3-4,L4-5,L5-S1) x5 (08/09/2021) (100/100/50)  Therapeutic bilateral lumbar facet MBB (L4-S1) (FCT: L5-S1) x1 (08/29/2022) (100/100/100 x3 days/R:0L:100)  Therapeutic right lumbar facet MBB (L2-S1) x4 (12/21/2020) (DKFUA)  Therapeutic right lumbar facet RFA x2 (06/04/2018) (100/90/75/75-100)  Therapeutic left lumbar facet RFA x1 (02/12/2018) (100/60/90/>75)  Diagnostic right SI joint Blk x3 (12/21/2020)  (DKFUA)  Diagnostic left SI joint Blk x1 (08/09/2017) (100/90/20/<25)  Therapeutic right SI joint RFA x3 (06/05/2019) (100/100/85/>50)  Therapeutic midline L2-3 LESI x1 (10/04/2017) (100/100/85/>50)  Therapeutic right L3-4 LESI x2 (11/04/2020) (100/100/20/<50)  Therapeutic right L4-5 LESI x2 (03/30/2022) (100/100/75/75)  Therapeutic right L5-S1 LESI x2 (12/04/2019) (100/100/50/75)  Diagnostic/therapeutic right L3 and L4 TFESI x1 (07/27/2022) (100/100/75/50)  Therapeutic right L5 TFESI x4 (02/14/2022) (100/100/70/75)  Therapeutic right S1 TFESI x1 (11/04/2020) (100/100/20/<50)  Diagnostic bilateral superficial trochanteric bursa injec. x1 (04/30/2018) (DKFUA)  Diagnostic right IA hip injection x1 (02/13/2019) (DKFUA)  Diagnostic/therapeutic right IA steroid knee inj. x3 (08/15/2022) (100/100/100/80)  Therapeutic right ([place]) knee  injection x1 (10/06/2021)  (DKFUA)  Therapeutic right (Monovisc) knee injection x1 (06/07/2021) (100/100/100/75)    Therapeutic  Palliative (PRN) options:   Therapeutic/palliative lumbar facet RFA  Therapeutic/palliative lumbar facet MBB  Therapeutic left L2 TFESI (this level is responsible for her low back pain and left lower extremity pain.)     Recent Visits Date Type Provider Dept  08/29/22 Procedure visit Milinda Pointer, MD [facility]  08/15/22 Office Visit Milinda Pointer, MD [facility]  07/27/22 Procedure visit Milinda Pointer, MD [facility]  06/26/22 Office Visit Milinda Pointer, MD [facility]  Showing recent visits within past 90 days and meeting all other requirements Today's Visits Date Type Provider Dept  09/12/22 Office Visit Milinda Pointer, MD [facility]  Showing today's visits and meeting all other requirements Future Appointments No visits were found meeting these conditions. Showing future appointments within next 90 days and meeting all other requirements  I discussed the assessment and treatment plan with the patient. The patient was provided an opportunity to ask questions and all were answered. The patient agreed with the plan and demonstrated an understanding of the instructions.  Patient advised to call back or seek an in-person  evaluation if the symptoms or condition worsens.  Duration of encounter: 15 minutes.  Note by: Gaspar Cola, MD Date: 09/12/2022; Time: 3:23 PM

## 2022-09-26 ENCOUNTER — Other Ambulatory Visit: Payer: Self-pay

## 2022-09-26 ENCOUNTER — Ambulatory Visit (INDEPENDENT_AMBULATORY_CARE_PROVIDER_SITE_OTHER): Payer: Medicare Other | Admitting: Internal Medicine

## 2022-09-26 ENCOUNTER — Encounter: Payer: Self-pay | Admitting: Internal Medicine

## 2022-09-26 ENCOUNTER — Telehealth: Payer: Self-pay

## 2022-09-26 VITALS — BP 120/68 | HR 64 | Temp 99.3°F | Ht 64.0 in | Wt 186.0 lb

## 2022-09-26 DIAGNOSIS — D649 Anemia, unspecified: Secondary | ICD-10-CM | POA: Diagnosis not present

## 2022-09-26 DIAGNOSIS — I700 Atherosclerosis of aorta: Secondary | ICD-10-CM

## 2022-09-26 DIAGNOSIS — R739 Hyperglycemia, unspecified: Secondary | ICD-10-CM | POA: Diagnosis not present

## 2022-09-26 DIAGNOSIS — Z0000 Encounter for general adult medical examination without abnormal findings: Secondary | ICD-10-CM | POA: Diagnosis not present

## 2022-09-26 DIAGNOSIS — K219 Gastro-esophageal reflux disease without esophagitis: Secondary | ICD-10-CM | POA: Diagnosis not present

## 2022-09-26 DIAGNOSIS — R002 Palpitations: Secondary | ICD-10-CM

## 2022-09-26 DIAGNOSIS — I10 Essential (primary) hypertension: Secondary | ICD-10-CM | POA: Diagnosis not present

## 2022-09-26 DIAGNOSIS — N1831 Chronic kidney disease, stage 3a: Secondary | ICD-10-CM

## 2022-09-26 DIAGNOSIS — E7800 Pure hypercholesterolemia, unspecified: Secondary | ICD-10-CM | POA: Diagnosis not present

## 2022-09-26 DIAGNOSIS — F320 Major depressive disorder, single episode, mild: Secondary | ICD-10-CM

## 2022-09-26 DIAGNOSIS — G20A1 Parkinson's disease without dyskinesia, without mention of fluctuations: Secondary | ICD-10-CM | POA: Diagnosis not present

## 2022-09-26 DIAGNOSIS — M810 Age-related osteoporosis without current pathological fracture: Secondary | ICD-10-CM | POA: Diagnosis not present

## 2022-09-26 LAB — CBC WITH DIFFERENTIAL/PLATELET
Basophils Absolute: 0.1 10*3/uL (ref 0.0–0.1)
Basophils Relative: 0.8 % (ref 0.0–3.0)
Eosinophils Absolute: 0.3 10*3/uL (ref 0.0–0.7)
Eosinophils Relative: 4.4 % (ref 0.0–5.0)
HCT: 24.2 % — ABNORMAL LOW (ref 36.0–46.0)
Hemoglobin: 8.5 g/dL — ABNORMAL LOW (ref 12.0–15.0)
Lymphocytes Relative: 19.2 % (ref 12.0–46.0)
Lymphs Abs: 1.3 10*3/uL (ref 0.7–4.0)
MCHC: 35.1 g/dL (ref 30.0–36.0)
MCV: 91.4 fl (ref 78.0–100.0)
Monocytes Absolute: 0.7 10*3/uL (ref 0.1–1.0)
Monocytes Relative: 10.1 % (ref 3.0–12.0)
Neutro Abs: 4.5 10*3/uL (ref 1.4–7.7)
Neutrophils Relative %: 65.5 % (ref 43.0–77.0)
Platelets: 313 10*3/uL (ref 150.0–400.0)
RBC: 2.65 Mil/uL — ABNORMAL LOW (ref 3.87–5.11)
RDW: 16 % — ABNORMAL HIGH (ref 11.5–15.5)
WBC: 6.9 10*3/uL (ref 4.0–10.5)

## 2022-09-26 LAB — HEPATIC FUNCTION PANEL
ALT: 16 U/L (ref 0–35)
AST: 22 U/L (ref 0–37)
Albumin: 4 g/dL (ref 3.5–5.2)
Alkaline Phosphatase: 34 U/L — ABNORMAL LOW (ref 39–117)
Bilirubin, Direct: 0.1 mg/dL (ref 0.0–0.3)
Total Bilirubin: 0.4 mg/dL (ref 0.2–1.2)
Total Protein: 7.8 g/dL (ref 6.0–8.3)

## 2022-09-26 LAB — BASIC METABOLIC PANEL
BUN: 22 mg/dL (ref 6–23)
CO2: 27 mEq/L (ref 19–32)
Calcium: 9.5 mg/dL (ref 8.4–10.5)
Chloride: 97 mEq/L (ref 96–112)
Creatinine, Ser: 1.4 mg/dL — ABNORMAL HIGH (ref 0.40–1.20)
GFR: 34.44 mL/min — ABNORMAL LOW (ref >60.00)
Glucose, Bld: 87 mg/dL (ref 70–99)
Potassium: 3.8 mEq/L (ref 3.5–5.1)
Sodium: 133 mEq/L — ABNORMAL LOW (ref 135–145)

## 2022-09-26 LAB — LIPID PANEL
Cholesterol: 99 mg/dL (ref 0–200)
HDL: 45.3 mg/dL (ref >39.00)
LDL Cholesterol: 37 mg/dL (ref 0–99)
NonHDL: 54.12
Total CHOL/HDL Ratio: 2
Triglycerides: 85 mg/dL (ref 0.0–149.0)
VLDL: 17 mg/dL (ref 0.0–40.0)

## 2022-09-26 LAB — HEMOGLOBIN A1C: Hgb A1c MFr Bld: 6.2 % (ref 4.6–6.5)

## 2022-09-26 NOTE — Progress Notes (Unsigned)
Patient ID: Wanda Martinez, female   DOB: 10/11/1935, 87 y.o.   MRN: [id number]   Subjective:    Patient ID: Wanda Martinez, female    DOB: 1935-01-16, 87 y.o.   MRN: [id number]    Patient here for  Chief Complaint  Patient presents with   Annual Exam   .   HPI Sees cardiology.  Last visit 08/22/22 - f/u 10/2021 visit.  Was seen in November with complaints of palpitations and sob. She was seen in November with complaints of palpitations shortness of breath. Functional study showed no evidence of ischemia.    Past Medical History:  Diagnosis Date   Acute postoperative pain 10/25/2017   Anemia    Arm pain 07/26/2015   Arthritis    Arthritis, degenerative 03/26/2014   Back pain 11/01/2013   Bladder infection 06/2018   Breast cancer ([place])    Masectomy - left - 1986    Breast cancer [facility])    Mastectomy-right -2014   CHEST PAIN 04/29/2010   Qualifier: Diagnosis of  By: Wynetta Emery RN, Erika     Chronic cystitis    Cystocele 02/22/2013   Cystocele, midline 08/19/2013   Degeneration of intervertebral disc of lumbosacral region 03/26/2014   DYSPNEA 04/29/2010   Qualifier: Diagnosis of  By: Wynetta Emery RN, Erika     Enthesopathy of hip 03/26/2014   GERD (gastroesophageal reflux disease)    Hiatal hernia    HTN (hypertension)    Hypokalemia 06/2018   Hyponatremia 06/2018   LBP (low back pain) 03/26/2014   Neck pain 11/01/2013   Parkinson disease ([place])    Pneumonia 06/2018   Sinusitis 02/07/2015   Skin lesions 07/12/2014   Urinary incontinence    mixed    Past Surgical History:  Procedure Laterality Date   ABDOMINAL HYSTERECTOMY     BREAST BIOPSY  2013   BREAST ENHANCEMENT SURGERY  1987   BREAST IMPLANT REMOVAL     BREAST IMPLANT REMOVAL Right 08/29/2012   BREAST SURGERY  1986   s/p left mastectomy   COLONOSCOPY WITH PROPOFOL N/A 09/13/2016   Procedure: COLONOSCOPY WITH PROPOFOL;  Surgeon: Manya Silvas, MD;  Location: [place];  Service: Endoscopy;  Laterality: N/A;    MASTECTOMY  08/2012   right   TONSILLECTOMY AND ADENOIDECTOMY  58   VESICOVAGINAL FISTULA CLOSURE W/ TAH  1983   Family History  Problem Relation Age of Onset   Diabetes Father    Stroke Father    Colon polyps Father    Stroke Mother    Parkinson's disease Mother    Social History   Socioeconomic History   Marital status: Married    Spouse name: Not on file   Number of children: 3   Years of education: Not on file   Highest education level: Not on file  Occupational History   Not on file  Tobacco Use   Smoking status: Never   Smokeless tobacco: Never   Tobacco comments:    tobacco use - no  Vaping Use   Vaping Use: Never used  Substance and Sexual Activity   Alcohol use: No    Alcohol/week: 0.0 standard drinks of alcohol   Drug use: No   Sexual activity: Never  Other Topics Concern   Not on file  Social History Narrative   Not on file   Social Determinants of Health   Financial Resource Strain: Low Risk  (07/10/2019)   Overall Financial Resource Strain (CARDIA)    Difficulty of Paying  Living Expenses: Not hard at all  Food Insecurity: No Food Insecurity (07/10/2019)   Hunger Vital Sign    Worried About Running Out of Food in the Last Year: Never true    Ran Out of Food in the Last Year: Never true  Transportation Needs: No Transportation Needs (07/12/2020)   PRAPARE - Hydrologist (Medical): No    Lack of Transportation (Non-Medical): No  Physical Activity: Not on file  Stress: No Stress Concern Present (07/12/2020)   [place]    Feeling of Stress : Not at all  Social Connections: Socially Integrated (07/12/2020)   Social Connection and Isolation Panel [NHANES]    Frequency of Communication with Friends and Family: Three times a week    Frequency of Social Gatherings with Friends and Family: More than three times a week    Attends Religious Services: More than 4 times  per year    Active Member of Clubs or Organizations: Yes    Attends Archivist Meetings: 1 to 4 times per year    Marital Status: Married     Review of Systems     Objective:     LMP 12/18/1981  Wt Readings from Last 3 Encounters:  08/29/22 180 lb (81.6 kg)  08/15/22 181 lb (82.1 kg)  07/27/22 179 lb (81.2 kg)    Physical Exam   Outpatient Encounter Medications as of 09/26/2022  Medication Sig   amLODipine (NORVASC) 2.5 MG tablet Take 1 tablet (2.5 mg total) by mouth daily.   aspirin 81 MG EC tablet Take 81 mg by mouth daily as needed.   calcium carbonate (OSCAL) 1500 (600 Ca) MG TABS tablet Take 1 tablet by mouth daily.   citalopram (CELEXA) 20 MG tablet TAKE 1 TABLET BY MOUTH ONCE DAILY.   cyclobenzaprine (FLEXERIL) 10 MG tablet TAKE ONE TABLET BY MOUTH AT BEDTIME.   donepezil (ARICEPT) 10 MG tablet TAKE 1 TABLET BY MOUTH ONCE NIGHTLY.   donepezil (ARICEPT) 5 MG tablet    gabapentin (NEURONTIN) 300 MG capsule TAKE (2) CAPSULES BY MOUTH TWICE DAILY   GLUCOSAMINE SULFATE PO Take by mouth daily.   Glucosamine-Chondroitin 500-400 MG CAPS Take 2 capsules by mouth daily.   hydrochlorothiazide (HYDRODIURIL) 25 MG tablet TAKE 1 TABLET BY MOUTH ONCE DAILY. (PATIENT TAKES 1/2 ONCE DAILY AND EXTRA 1/2 IF NEEDED)   HYDROcodone-acetaminophen (NORCO/VICODIN) 5-325 MG tablet Take 1 tablet by mouth 3 (three) times daily. Must last 30 days.   HYDROcodone-acetaminophen (NORCO/VICODIN) 5-325 MG tablet Take 1 tablet by mouth 3 (three) times daily. Must last 30 days.   HYDROcodone-acetaminophen (NORCO/VICODIN) 5-325 MG tablet Take 1 tablet by mouth 3 (three) times daily. Must last 30 days.   letrozole (FEMARA) 2.5 MG tablet Take 2.5 mg by mouth daily.   losartan (COZAAR) 50 MG tablet TAKE (2) TABLETS BY MOUTH ONCE DAILY.   lubiprostone (AMITIZA) 24 MCG capsule TAKE 1 CAPSULE BY MOUTH ONCE DAILY WITH BREAKFAST.   magnesium oxide (MAG-OX) 400 (240 Mg) MG tablet TAKE 1 TABLET BY MOUTH  ONCE DAILY.   melatonin 1 MG TABS tablet Take 3 mg by mouth at bedtime.   meloxicam (MOBIC) 7.5 MG tablet TAKE 1 TABLET BY MOUTH ONCE DAILY.   metoprolol succinate (TOPROL-XL) 25 MG 24 hr tablet TAKE 1 TABLET BY MOUTH TWICE DAILY   Multiple Vitamin (MULTIVITAMIN) tablet Take 1 tablet by mouth daily.   nitrofurantoin, macrocrystal-monohydrate, (MACROBID) 100 MG capsule Take 1  capsule (100 mg total) by mouth daily.   Omega-3 1000 MG CAPS Take by mouth.   omeprazole (PRILOSEC) 20 MG capsule TAKE 1 CAPSULE BY MOUTH TWICE DAILY FOR REFLUX.   pantoprazole (PROTONIX) 40 MG tablet TAKE 1 TABLET BY MOUTH ONCE DAILY.   rosuvastatin (CRESTOR) 20 MG tablet TAKE 1 TABLET BY MOUTH ONCE DAILY.   No facility-administered encounter medications on file as of 09/26/2022.     Lab Results  Component Value Date   WBC 6.6 03/07/2022   HGB 11.0 (L) 03/07/2022   HCT 32.7 (L) 03/07/2022   PLT 196.0 03/07/2022   GLUCOSE 84 05/09/2022   CHOL 120 03/07/2022   TRIG 181.0 (H) 03/07/2022   HDL 39.20 03/07/2022   LDLDIRECT 73.0 07/09/2018   LDLCALC 45 03/07/2022   ALT 18 03/07/2022   AST 27 03/07/2022   NA 135 05/09/2022   K 4.0 05/09/2022   CL 99 05/09/2022   CREATININE 1.15 (H) 05/09/2022   BUN 24 (H) 05/09/2022   CO2 28 05/09/2022   TSH 2.99 03/07/2022   HGBA1C 6.2 03/07/2022    DG PAIN CLINIC C-ARM 1-60 MIN NO REPORT  Result Date: 08/29/2022 Fluoro was used, but no Radiologist interpretation will be provided. Please refer to "NOTES" tab for provider progress note.      Assessment & Plan:   Problem List Items Addressed This Visit   None    Einar Pheasant, MD

## 2022-09-26 NOTE — Telephone Encounter (Signed)
Close  

## 2022-09-26 NOTE — Assessment & Plan Note (Addendum)
Physical today 09/26/22.  Bilateral mastectomy - no mammogram.  Followed by oncology.  On prolia.

## 2022-09-27 LAB — TSH: TSH: 1.42 u[IU]/mL (ref 0.35–5.50)

## 2022-09-27 LAB — VITAMIN D 25 HYDROXY (VIT D DEFICIENCY, FRACTURES): VITD: 66.95 ng/mL (ref 30.00–100.00)

## 2022-09-28 ENCOUNTER — Ambulatory Visit: Payer: Medicare Other | Attending: Pain Medicine | Admitting: Pain Medicine

## 2022-09-28 ENCOUNTER — Encounter: Payer: Self-pay | Admitting: Pain Medicine

## 2022-09-28 ENCOUNTER — Ambulatory Visit
Admission: RE | Admit: 2022-09-28 | Discharge: 2022-09-28 | Disposition: A | Discharge status: Home / Self Care | Payer: Medicare Other | Source: Ambulatory Visit | Attending: Pain Medicine | Admitting: Pain Medicine

## 2022-09-28 VITALS — BP 158/80 | HR 60 | Temp 97.2°F | Resp 13 | Ht 64.0 in | Wt 183.0 lb

## 2022-09-28 DIAGNOSIS — G8929 Other chronic pain: Secondary | ICD-10-CM

## 2022-09-28 DIAGNOSIS — G8918 Other acute postprocedural pain: Secondary | ICD-10-CM | POA: Insufficient documentation

## 2022-09-28 DIAGNOSIS — M47817 Spondylosis without myelopathy or radiculopathy, lumbosacral region: Secondary | ICD-10-CM

## 2022-09-28 DIAGNOSIS — M5137 Other intervertebral disc degeneration, lumbosacral region: Secondary | ICD-10-CM | POA: Insufficient documentation

## 2022-09-28 DIAGNOSIS — M545 Low back pain, unspecified: Secondary | ICD-10-CM | POA: Diagnosis not present

## 2022-09-28 DIAGNOSIS — M47816 Spondylosis without myelopathy or radiculopathy, lumbar region: Secondary | ICD-10-CM | POA: Diagnosis not present

## 2022-09-28 MED ORDER — TRAMADOL HCL 50 MG PO TABS: 50.0000 mg | ORAL_TABLET | Freq: Three times a day (TID) | ORAL | 0 refills | Status: DC | PRN

## 2022-09-28 MED ORDER — LIDOCAINE HCL 2 % IJ SOLN
20.0000 mL | Freq: Once | INTRAMUSCULAR | Status: AC
  Administered 2022-09-28: 400 mg
  Filled 2022-09-28: qty 20

## 2022-09-28 MED ORDER — PENTAFLUOROPROP-TETRAFLUOROETH EX AERO
INHALATION_SPRAY | Freq: Once | CUTANEOUS | Status: AC
  Administered 2022-09-28: 30 via TOPICAL

## 2022-09-28 MED ORDER — ROPIVACAINE HCL 2 MG/ML IJ SOLN
9.0000 mL | Freq: Once | INTRAMUSCULAR | Status: AC
  Administered 2022-09-28: 9 mL via PERINEURAL
  Filled 2022-09-28: qty 20

## 2022-09-28 MED ORDER — TRIAMCINOLONE ACETONIDE 40 MG/ML IJ SUSP
40.0000 mg | Freq: Once | INTRAMUSCULAR | Status: AC
  Administered 2022-09-28: 40 mg
  Filled 2022-09-28: qty 1

## 2022-09-28 NOTE — Progress Notes (Signed)
PROVIDER NOTE: Interpretation of information contained herein should be left to medically-trained personnel. Specific patient instructions are provided elsewhere under "Patient Instructions" section of medical record. This document was created in part using STT-dictation technology, any transcriptional errors that may result from this process are unintentional.  Patient: Wanda Martinez Type: Established DOB: 1935/05/29 MRN: [id number] PCP: Einar Pheasant, MD  Service: Procedure DOS: 09/28/2022 Setting: Ambulatory Location: Ambulatory outpatient facility Delivery: Face-to-face Provider: Gaspar Cola, MD Specialty: Interventional Pain Management Specialty designation: 09 Location: Outpatient facility Ref. Prov.: Milinda Pointer, MD    Procedure:           Type: Lumbar Facet, Medial Branch Radiofrequency Ablation (RFA) #3  Laterality: Right (-RT)  Level: L2, L3, L4, L5, & S1 Medial Branch Level(s). These levels will denervate the L3-4, L4-5 and L5-S1 lumbar facet joints.  Imaging: Fluoroscopy-guided         Anesthesia: Local anesthesia (1-2% Lidocaine) Anxiolysis: None Sedation: No Sedation                       DOS: 09/28/2022  Performed by: Gaspar Cola, MD  Purpose: Therapeutic/Palliative Indications: Low back pain severe enough to impact quality of life or function. Indications: 1. Lumbar facet syndrome (Bilateral) (R>L)   2. Spondylosis without myelopathy or radiculopathy, lumbosacral region   3. Lumbar facet hypertrophy (Multilevel) (Bilateral)   4. DDD (degenerative disc disease), lumbosacral   5. Chronic low back pain (1ry area of Pain) (Bilateral) (R>L) w/o sciatica    Ms. Kapler has been dealing with the above chronic pain for longer than three months and has either failed to respond, was unable to tolerate, or simply did not get enough benefit from other more conservative therapies including, but not limited to: 1. Over-the-counter medications 2.  Anti-inflammatory medications 3. Muscle relaxants 4. Membrane stabilizers 5. Opioids 6. Physical therapy and/or chiropractic manipulation 7. Modalities (Heat, ice, etc.) 8. Invasive techniques such as nerve blocks. Ms. Geraci has attained more than 50% relief of the pain from a series of diagnostic injections conducted in separate occasions.  Pain Score: Pre-procedure: 10-Worst pain ever/10 Post-procedure: 10-Worst pain ever/10     Position / Prep / Materials:  Position: Prone  Prep solution: DuraPrep (Iodine Povacrylex [0.7% available iodine] and Isopropyl Alcohol, 74% w/w) Prep Area: Entire Lumbosacral Region (Lower back from mid-thoracic region to end of tailbone and from flank to flank.) Materials:  Tray: RFA (Radiofrequency) tray Needle(s):  Type: RFA (Teflon-coated radiofrequency ablation needles) Gauge (G): 22  Length: Regular (10cm) Qty: 5  Pre-op H&P Assessment:  Ms. Donlon is a 87 y.o. (year old), female patient, seen today for interventional treatment. She  has a past surgical history that includes Tonsillectomy and adenoidectomy (81); Vesicovaginal fistula closure w/ TAH (1983); Breast surgery (1986); Breast enhancement surgery (1987); Breast implant removal; Breast implant removal (Right, 08/29/2012); Mastectomy (08/2012); Abdominal hysterectomy; Colonoscopy with propofol (N/A, 09/13/2016); and Breast biopsy (2013). Ms. Tryon has a current medication list which includes the following prescription(s): amlodipine, aspirin ec, calcium carbonate, citalopram, cyclobenzaprine, donepezil, gabapentin, glucosamine sulfate, glucosamine-chondroitin, hydrochlorothiazide, letrozole, losartan, lubiprostone, magnesium oxide, melatonin, meloxicam, metoprolol succinate, multivitamin, nitrofurantoin (macrocrystal-monohydrate), omega-3, omeprazole, pantoprazole, rosuvastatin, tramadol, [START ON 10/05/2022] tramadol, hydrocodone-acetaminophen, hydrocodone-acetaminophen, and  hydrocodone-acetaminophen. Her primarily concern today is the Back Pain (Lumbar right side )  Initial Vital Signs:  Pulse/HCG Rate: 61  Temp: (!) 97.2 F (36.2 C) Resp: 16 BP: 132/77 SpO2: 100 %  BMI: Estimated body mass index is 31.41 kg/m as  calculated from the following:   Height as of this encounter: '5\' 4"'$  (1.626 m).   Weight as of this encounter: 183 lb (83 kg).  Risk Assessment: Allergies: Reviewed. She is allergic to sulfa antibiotics and vesicare [solifenacin].  Allergy Precautions: None required Coagulopathies: Reviewed. None identified.  Blood-thinner therapy: None at this time Active Infection(s): Reviewed. None identified. Ms. Grinage is afebrile  Site Confirmation: Ms. Ishida was asked to confirm the procedure and laterality before marking the site Procedure checklist: Completed Consent: Before the procedure and under the influence of no sedative(s), amnesic(s), or anxiolytics, the patient was informed of the treatment options, risks and possible complications. To fulfill our ethical and legal obligations, as recommended by the American Medical Association's Code of Ethics, I have informed the patient of my clinical impression; the nature and purpose of the treatment or procedure; the risks, benefits, and possible complications of the intervention; the alternatives, including doing nothing; the risk(s) and benefit(s) of the alternative treatment(s) or procedure(s); and the risk(s) and benefit(s) of doing nothing. The patient was provided information about the general risks and possible complications associated with the procedure. These may include, but are not limited to: failure to achieve desired goals, infection, bleeding, organ or nerve damage, allergic reactions, paralysis, and death. In addition, the patient was informed of those risks and complications associated to Spine-related procedures, such as failure to decrease pain; infection (i.e.: Meningitis, epidural or  intraspinal abscess); bleeding (i.e.: epidural hematoma, subarachnoid hemorrhage, or any other type of intraspinal or peri-dural bleeding); organ or nerve damage (i.e.: Any type of peripheral nerve, nerve root, or spinal cord injury) with subsequent damage to sensory, motor, and/or autonomic systems, resulting in permanent pain, numbness, and/or weakness of one or several areas of the body; allergic reactions; (i.e.: anaphylactic reaction); and/or death. Furthermore, the patient was informed of those risks and complications associated with the medications. These include, but are not limited to: allergic reactions (i.e.: anaphylactic or anaphylactoid reaction(s)); adrenal axis suppression; blood sugar elevation that in diabetics may result in ketoacidosis or comma; water retention that in patients with history of congestive heart failure may result in shortness of breath, pulmonary edema, and decompensation with resultant heart failure; weight gain; swelling or edema; medication-induced neural toxicity; particulate matter embolism and blood vessel occlusion with resultant organ, and/or nervous system infarction; and/or aseptic necrosis of one or more joints. Finally, the patient was informed that Medicine is not an exact science; therefore, there is also the possibility of unforeseen or unpredictable risks and/or possible complications that may result in a catastrophic outcome. The patient indicated having understood very clearly. We have given the patient no guarantees and we have made no promises. Enough time was given to the patient to ask questions, all of which were answered to the patient's satisfaction. Ms. Distel has indicated that she wanted to continue with the procedure. Attestation: I, the ordering provider, attest that I have discussed with the patient the benefits, risks, side-effects, alternatives, likelihood of achieving goals, and potential problems during recovery for the procedure that I have  provided informed consent. Date  Time: 09/28/2022  8:17 AM  Pre-Procedure Preparation:  Monitoring: As per clinic protocol. Respiration, ETCO2, SpO2, BP, heart rate and rhythm monitor placed and checked for adequate function Safety Precautions: Patient was assessed for positional comfort and pressure points before starting the procedure. Time-out: I initiated and conducted the "Time-out" before starting the procedure, as per protocol. The patient was asked to participate by confirming the accuracy of the "  Time Out" information. Verification of the correct person, site, and procedure were performed and confirmed by me, the nursing staff, and the patient. "Time-out" conducted as per Joint Commission's Universal Protocol (UP.01.01.01). Time: 0900  Description of Procedure:          Laterality: Right Levels:  L2, L3, L4, L5, & S1 Medial Branch Level(s). Safety Precautions: Aspiration looking for blood return was conducted prior to all injections. At no point did we inject any substances, as a needle was being advanced. Before injecting, the patient was told to immediately notify me if she was experiencing any new onset of "ringing in the ears, or metallic taste in the mouth". No attempts were made at seeking any paresthesias. Safe injection practices and needle disposal techniques used. Medications properly checked for expiration dates. SDV (single dose vial) medications used. After the completion of the procedure, all disposable equipment used was discarded in the proper designated medical waste containers. Local Anesthesia: Protocol guidelines were followed. The patient was positioned over the fluoroscopy table. The area was prepped in the usual manner. The time-out was completed. The target area was identified using fluoroscopy. A 12-in long, straight, sterile hemostat was used with fluoroscopic guidance to locate the targets for each level blocked. Once located, the skin was marked with an approved  surgical skin marker. Once all sites were marked, the skin (epidermis, dermis, and hypodermis), as well as deeper tissues (fat, connective tissue and muscle) were infiltrated with a small amount of a short-acting local anesthetic, loaded on a 10cc syringe with a 25G, 1.5-in  Needle. An appropriate amount of time was allowed for local anesthetics to take effect before proceeding to the next step. Technical description of process:  Radiofrequency Ablation (RFA) L2 Medial Branch Nerve RFA: The target area for the L2 medial branch is at the junction of the postero-lateral aspect of the superior articular process and the superior, posterior, and medial edge of the transverse process of L3. Under fluoroscopic guidance, a Radiofrequency needle was inserted until contact was made with os over the superior postero-lateral aspect of the pedicular shadow (target area). Sensory and motor testing was conducted to properly adjust the position of the needle. Once satisfactory placement of the needle was achieved, the numbing solution was slowly injected after negative aspiration for blood. 2.0 mL of the nerve block solution was injected without difficulty or complication. After waiting for at least 3 minutes, the ablation was performed. Once completed, the needle was removed intact. L3 Medial Branch Nerve RFA: The target area for the L3 medial branch is at the junction of the postero-lateral aspect of the superior articular process and the superior, posterior, and medial edge of the transverse process of L4. Under fluoroscopic guidance, a Radiofrequency needle was inserted until contact was made with os over the superior postero-lateral aspect of the pedicular shadow (target area). Sensory and motor testing was conducted to properly adjust the position of the needle. Once satisfactory placement of the needle was achieved, the numbing solution was slowly injected after negative aspiration for blood. 2.0 mL of the nerve block  solution was injected without difficulty or complication. After waiting for at least 3 minutes, the ablation was performed. Once completed, the needle was removed intact. L4 Medial Branch Nerve RFA: The target area for the L4 medial branch is at the junction of the postero-lateral aspect of the superior articular process and the superior, posterior, and medial edge of the transverse process of L5. Under fluoroscopic guidance, a Radiofrequency needle was  inserted until contact was made with os over the superior postero-lateral aspect of the pedicular shadow (target area). Sensory and motor testing was conducted to properly adjust the position of the needle. Once satisfactory placement of the needle was achieved, the numbing solution was slowly injected after negative aspiration for blood. 2.0 mL of the nerve block solution was injected without difficulty or complication. After waiting for at least 3 minutes, the ablation was performed. Once completed, the needle was removed intact. L5 Medial Branch Nerve RFA: The target area for the L5 medial branch is at the junction of the postero-lateral aspect of the superior articular process of S1 and the superior, posterior, and medial edge of the sacral ala. Under fluoroscopic guidance, a Radiofrequency needle was inserted until contact was made with os over the superior postero-lateral aspect of the pedicular shadow (target area). Sensory and motor testing was conducted to properly adjust the position of the needle. Once satisfactory placement of the needle was achieved, the numbing solution was slowly injected after negative aspiration for blood. 2.0 mL of the nerve block solution was injected without difficulty or complication. After waiting for at least 3 minutes, the ablation was performed. Once completed, the needle was removed intact. S1 Medial Branch Nerve RFA: The target area for the S1 medial branch is located inferior to the junction of the S1 superior articular  process and the L5 inferior articular process, posterior, inferior, and lateral to the 6 o'clock position of the L5-S1 facet joint, just superior to the S1 posterior foramen. Under fluoroscopic guidance, the Radiofrequency needle was advanced until contact was made with os over the Target area. Sensory and motor testing was conducted to properly adjust the position of the needle. Once satisfactory placement of the needle was achieved, the numbing solution was slowly injected after negative aspiration for blood. 2.0 mL of the nerve block solution was injected without difficulty or complication. After waiting for at least 3 minutes, the ablation was performed. Once completed, the needle was removed intact. Radiofrequency lesioning (ablation):  Radiofrequency Generator: Medtronic AccurianTM AG 1000 RF Generator Sensory Stimulation Parameters: 50 Hz was used to locate & identify the nerve, making sure that the needle was positioned such that there was no sensory stimulation below 0.3 V or above 0.7 V. Motor Stimulation Parameters: 2 Hz was used to evaluate the motor component. Care was taken not to lesion any nerves that demonstrated motor stimulation of the lower extremities at an output of less than 2.5 times that of the sensory threshold, or a maximum of 2.0 V. Lesioning Technique Parameters: Standard Radiofrequency settings. (Not bipolar or pulsed.) Temperature Settings: 80 degrees C Lesioning time: 60 seconds Intra-operative Compliance: Compliant  Once the entire procedure was completed, the treated area was cleaned, making sure to leave some of the prepping solution back to take advantage of its long term bactericidal properties.    Illustration of the posterior view of the lumbar spine and the posterior neural structures. Laminae of L2 through S1 are labeled. DPRL5, dorsal primary ramus of L5; DPRS1, dorsal primary ramus of S1; DPR3, dorsal primary ramus of L3; FJ, facet (zygapophyseal) joint L3-L4;  I, inferior articular process of L4; LB1, lateral branch of dorsal primary ramus of L1; IAB, inferior articular branches from L3 medial branch (supplies L4-L5 facet joint); IBP, intermediate branch plexus; MB3, medial branch of dorsal primary ramus of L3; NR3, third lumbar nerve root; S, superior articular process of L5; SAB, superior articular branches from L4 (supplies L4-5 facet joint  also); TP3, transverse process of L3.  Vitals:   09/28/22 0915 09/28/22 0925 09/28/22 0935 09/28/22 0941  BP: (!) 144/74 (!) 145/68 (!) 141/71 (!) 158/80  Pulse: (!) 59 (!) 59 (!) 59 60  Resp: '12 13 12 13  '$ Temp:      TempSrc:      SpO2: 99% 98% 97% 98%  Weight:      Height:        Start Time: 0900 hrs. End Time: 0938 hrs.  Imaging Guidance (Spinal):          Type of Imaging Technique: Fluoroscopy Guidance (Spinal) Indication(s): Assistance in needle guidance and placement for procedures requiring needle placement in or near specific anatomical locations not easily accessible without such assistance. Exposure Time: Please see nurses notes. Contrast: None used. Fluoroscopic Guidance: I was personally present during the use of fluoroscopy. "Tunnel Vision Technique" used to obtain the best possible view of the target area. Parallax error corrected before commencing the procedure. "Direction-depth-direction" technique used to introduce the needle under continuous pulsed fluoroscopy. Once target was reached, antero-posterior, oblique, and lateral fluoroscopic projection used confirm needle placement in all planes. Images permanently stored in EMR. Interpretation: No contrast injected. I personally interpreted the imaging intraoperatively. Adequate needle placement confirmed in multiple planes. Permanent images saved into the patient's record.  Antibiotic Prophylaxis:   Anti-infectives (From admission, onward)    None      Indication(s): None identified  Post-operative Assessment:  Post-procedure Vital  Signs:  Pulse/HCG Rate: 60  Temp: (!) 97.2 F (36.2 C) Resp: 13 BP: (!) 158/80 SpO2: 98 %  EBL: None  Complications: No immediate post-treatment complications observed by team, or reported by patient.  Note: The patient tolerated the entire procedure well. A repeat set of vitals were taken after the procedure and the patient was kept under observation following institutional policy, for this type of procedure. Post-procedural neurological assessment was performed, showing return to baseline, prior to discharge. The patient was provided with post-procedure discharge instructions, including a section on how to identify potential problems. Should any problems arise concerning this procedure, the patient was given instructions to immediately contact us, at any time, without hesitation. In any case, we plan to contact the patient by telephone for a follow-up status report regarding this interventional procedure.  Comments:  No additional relevant information.  Plan of Care  Orders:  Orders Placed This Encounter  Procedures   Radiofrequency,Lumbar    Scheduling Instructions:     Side(s): Right-sided     Level: L3-4, L4-5, & L5-S1 Facets (L2, L3, L4, L5, & S1 Medial Branch Nerves)     Sedation: With Sedation.     Timeframe: Today    Order Specific Question:   Where will this procedure be performed?    Answer:   [facility]   Radiofrequency,Lumbar    Standing Status:   Future    Standing Expiration Date:   12/29/2022    Scheduling Instructions:     Side(s): Left-sided     Level: L3-4, L4-5, & L5-S1 Facets (L2, L3, L4, L5, & S1 Medial Branch Nerves)     Sedation: Patient's choice.     Scheduling Timeframe: 2 weeks from now    Order Specific Question:   Where will this procedure be performed?    Answer:   [facility]   DG PAIN CLINIC C-ARM 1-60 MIN NO REPORT    Intraoperative interpretation by procedural physician at [place].    Standing Status:  Standing    Number of Occurrences:   1    Order Specific Question:   Reason for exam:    Answer:   Assistance in needle guidance and placement for procedures requiring needle placement in or near specific anatomical locations not easily accessible without such assistance.   Informed Consent Details: Physician/Practitioner Attestation; Transcribe to consent form and obtain patient signature    Nursing Order: Transcribe to consent form and obtain patient signature. Note: Always confirm laterality of pain with Ms. Ermalinda Barrios, before procedure.    Order Specific Question:   Physician/Practitioner attestation of informed consent for procedure/surgical case    Answer:   I, the physician/practitioner, attest that I have discussed with the patient the benefits, risks, side effects, alternatives, likelihood of achieving goals and potential problems during recovery for the procedure that I have provided informed consent.    Order Specific Question:   Procedure    Answer:   Lumbar Facet Radiofrequency Ablation    Order Specific Question:   Physician/Practitioner performing the procedure    Answer:   Sani Loiseau A. Dossie Arbour, MD    Order Specific Question:   Indication/Reason    Answer:   Low Back Pain, with our without leg pain, due to Facet Joint Arthralgia (Joint Pain) known as Lumbar Facet Syndrome, secondary to Lumbar, and/or Lumbosacral Spondylosis (Arthritis of the Spine), without myelopathy or radiculopathy (Nerve Damage).   Provide equipment / supplies at bedside    "Radiofrequency Tray"; Large hemostat (x1); Small hemostat (x1); Towels (x8); 4x4 sterile sponge pack (x1) Needle type: Teflon-coated Radiofrequency Needle (Disposable  single use) Size: Regular Quantity: 5    Standing Status:   Standing    Number of Occurrences:   1    Order Specific Question:   Specify    Answer:   Radiofrequency Tray   Chronic Opioid Analgesic:  Hydrocodone/APAP 5/325, 1 tab PO q 8 hrs (15 mg/day of  hydrocodone) MME/day: 15 mg/day.   Medications ordered for procedure: Meds ordered this encounter  Medications   lidocaine (XYLOCAINE) 2 % (with pres) injection 400 mg   pentafluoroprop-tetrafluoroeth (GEBAUERS) aerosol   ropivacaine (PF) 2 mg/mL (0.2%) (NAROPIN) injection 9 mL   triamcinolone acetonide (KENALOG-40) injection 40 mg   traMADol (ULTRAM) 50 MG tablet    Sig: Take 1 tablet (50 mg total) by mouth every 8 (eight) hours as needed for up to 7 days for severe pain. Must last 7 days.    Dispense:  21 tablet    Refill:  0    For acute post-operative pain. Not to be refilled.  Must last 7 days.   traMADol (ULTRAM) 50 MG tablet    Sig: Take 1 tablet (50 mg total) by mouth every 8 (eight) hours as needed for up to 7 days for severe pain. Must last 7 days.    Dispense:  21 tablet    Refill:  0    For acute post-operative pain. Not to be refilled.  Must last 7 days.   Medications administered: We administered lidocaine, pentafluoroprop-tetrafluoroeth, ropivacaine (PF) 2 mg/mL (0.2%), and triamcinolone acetonide.  See the medical record for exact dosing, route, and time of administration.  Follow-up plan:   Return in about 2 weeks (around 10/12/2022) for (33mn), procedure (Clinic): (L) L-FCT RFA #2.       Interventional Therapies  Risk  Complexity Considerations:   Estimated body mass index is 30.73 kg/m as calculated from the following:   Height as of this encounter: '5\' 4"'$  (1.626 m).  Weight as of this encounter: 179 lb (81.2 kg). WNL   Planned  Pending:   Therapeutic right lumbar facet (L4-S1) RFA #3    Under consideration:   Diagnostic right genicular NB  Diagnostic bilateral cervical facet MBB  Therapeutic right L3 and L4 TFESI #1    Completed:   Therapeutic right superior/lateral trapezius muscle TPI x1 (04/19/2021)  Therapeutic right thoracic back TPI x2 (10/17/2018) (DKFUA)  Therapeutic left thoracic back TPI x1 (10/17/2018) (DKFUA)  Therapeutic right  quadratus lumborum and erector spinae muscle TPI x1 (02/12/2018)  Therapeutic bilateral lumbar facet MBB (L2-S1) (FCT: L3-4,L4-5,L5-S1) x5 (08/09/2021) (100/100/50)  Therapeutic bilateral lumbar facet MBB (L4-S1) (FCT: L5-S1) x1 (08/29/2022) (100/100/100 x3 days/R:0L:100)  Therapeutic right lumbar facet MBB (L2-S1) x4 (12/21/2020) (DKFUA)  Therapeutic right lumbar facet RFA x2 (06/04/2018) (100/90/75/75-100)  Therapeutic left lumbar facet RFA x1 (02/12/2018) (100/60/90/>75)  Diagnostic right SI joint Blk x3 (12/21/2020)  (DKFUA)  Diagnostic left SI joint Blk x1 (08/09/2017) (100/90/20/<25)  Therapeutic right SI joint RFA x3 (06/05/2019) (100/100/85/>50)  Therapeutic midline L2-3 LESI x1 (10/04/2017) (100/100/85/>50)  Therapeutic right L3-4 LESI x2 (11/04/2020) (100/100/20/<50)  Therapeutic right L4-5 LESI x2 (03/30/2022) (100/100/75/75)  Therapeutic right L5-S1 LESI x2 (12/04/2019) (100/100/50/75)  Diagnostic/therapeutic right L3 and L4 TFESI x1 (07/27/2022) (100/100/75/50)  Therapeutic right L5 TFESI x4 (02/14/2022) (100/100/70/75)  Therapeutic right S1 TFESI x1 (11/04/2020) (100/100/20/<50)  Diagnostic bilateral superficial trochanteric bursa injec. x1 (04/30/2018) ([facility])  Diagnostic right IA hip injection x1 (02/13/2019) ([facility])  Diagnostic/therapeutic right IA steroid knee inj. x3 (08/15/2022) (100/100/100/80)  Therapeutic right (Zilretta) knee injection x1 (10/06/2021)  ([facility])  Therapeutic right (Monovisc) knee injection x1 (06/07/2021) (100/100/100/75)    Therapeutic  Palliative (PRN) options:   Therapeutic/palliative lumbar facet RFA  Therapeutic/palliative lumbar facet MBB  Therapeutic left L2 TFESI (this level is responsible for her low back pain and left lower extremity pain.)      Recent Visits Date Type Provider Dept  09/12/22 Office Visit Milinda Pointer, MD [facility]  08/29/22 Procedure visit Milinda Pointer, MD [facility]  08/15/22 Office Visit  Milinda Pointer, MD [facility]  07/27/22 Procedure visit Milinda Pointer, MD [facility]  Showing recent visits within past 90 days and meeting all other requirements Today's Visits Date Type Provider Dept  09/28/22 Procedure visit Milinda Pointer, MD [facility]  Showing today's visits and meeting all other requirements Future Appointments No visits were found meeting these conditions. Showing future appointments within next 90 days and meeting all other requirements  Disposition: Discharge home  Discharge (Date  Time): 09/28/2022; 0942 hrs.   Primary Care Physician: Einar Pheasant, MD Location: [facility] Note by: Gaspar Cola, MD Date: 09/28/2022; Time: 9:54 AM  Disclaimer:  Medicine is not an Chief Strategy Officer. The only guarantee in medicine is that nothing is guaranteed. It is important to note that the decision to proceed with this intervention was based on the information collected from the patient. The Data and conclusions were drawn from the patient's questionnaire, the interview, and the physical examination. Because the information was provided in large part by the patient, it cannot be guaranteed that it has not been purposely or unconsciously manipulated. Every effort has been made to obtain as much relevant data as possible for this evaluation. It is important to note that the conclusions that lead to this procedure are derived in large part from the available data. Always take into account that the treatment will also be dependent on availability  of resources and existing treatment guidelines, considered by other Pain Management Practitioners as being common knowledge and practice, at the time of the intervention. For Medico-Legal purposes, it is also important to point out that variation in procedural techniques and pharmacological choices are the acceptable norm. The indications, contraindications,  technique, and results of the above procedure should only be interpreted and judged by a Board-Certified Interventional Pain Specialist with extensive familiarity and expertise in the same exact procedure and technique.

## 2022-09-28 NOTE — Progress Notes (Signed)
[place] and they do have 2 Rx's left for this patients hydrocodone - apap 5-325,  that would be the August fill and September fill.   They will get the August Rx filled for Wanda Martinez to pick up today.

## 2022-09-28 NOTE — Progress Notes (Deleted)
Safety precautions to be maintained throughout the outpatient stay will include: orient to surroundings, keep bed in low position, maintain call bell within reach at all times, provide assistance with transfer out of bed and ambulation.  

## 2022-09-28 NOTE — Patient Instructions (Signed)
___________________________________________________________________________________________  Post-Radiofrequency (RF) Discharge Instructions  You have just completed a Radiofrequency Neurotomy.  The following instructions will provide you with information and guidelines for self-care upon discharge.  If at any time you have questions or concerns please call your physician. DO NOT DRIVE YOURSELF!!  Instructions: Apply ice: Fill a plastic sandwich bag with crushed ice. Cover it with a small towel and apply to injection site. Apply for 15 minutes then remove x 15 minutes. Repeat sequence on day of procedure, until you go to bed. The purpose is to minimize swelling and discomfort after procedure. Apply heat: Apply heat to procedure site starting the day following the procedure. The purpose is to treat any soreness and discomfort from the procedure. Food intake: No eating limitations, unless stipulated above.  Nevertheless, if you have had sedation, you may experience some nausea.  In this case, it may be wise to wait at least two hours prior to resuming regular diet. Physical activities: Keep activities to a minimum for the first 8 hours after the procedure. For the first 24 hours after the procedure, do not drive a motor vehicle,  Operate heavy machinery, power tools, or handle any weapons.  Consider walking with the use of an assistive device or accompanied by an adult for the first 24 hours.  Do not drink alcoholic beverages including beer.  Do not make any important decisions or sign any legal documents. Go home and rest today.  Resume activities tomorrow, as tolerated.  Use caution in moving about as you may experience mild leg weakness.  Use caution in cooking, use of household electrical appliances and climbing steps. Driving: If you have received any sedation, you are not allowed to drive for 24 hours after your procedure. Blood thinner: Restart your blood thinner 6 hours after your procedure. (Only  for those taking blood thinners) Insulin: As soon as you can eat, you may resume your normal dosing schedule. (Only for those taking insulin) Medications: May resume pre-procedure medications.  Do not take any drugs, other than what has been prescribed to you. Infection prevention: Keep procedure site clean and dry. Post-procedure Pain Diary: Extremely important that this be done correctly and accurately. Recorded information will be used to determine the next step in treatment. Pain evaluated is that of treated area only. Do not include pain from an untreated area. Complete every hour, on the hour, for the initial 8 hours. Set an alarm to help you do this part accurately. Do not go to sleep and have it completed later. It will not be accurate. Follow-up appointment: Keep your follow-up appointment after the procedure. Usually 2-6 weeks after radiofrequency. Bring you pain diary. The information collected will be essential for your long-term care.   Expect: From numbing medicine (AKA: Local Anesthetics): Numbness or decrease in pain. Onset: Full effect within 15 minutes of injected. Duration: It will depend on the type of local anesthetic used. On the average, 1 to 8 hours.  From steroids (when added): Decrease in swelling or inflammation. Once inflammation is improved, relief of the pain will follow. Onset of benefits: Depends on the amount of swelling present. The more swelling, the longer it will take for the benefits to be seen. In some cases, up to 10 days. Duration: Steroids will stay in the system x 2 weeks. Duration of benefits will depend on multiple posibilities including persistent irritating factors. From procedure: Some discomfort is to be expected once the numbing medicine wears off. In the case of radiofrequency procedures,  this may last as long as 6 weeks. Additional post-procedure pain medication is provided for this. Discomfort is minimized if ice and heat are applied as  instructed.  Call if: You experience numbness and weakness that gets worse with time, as opposed to wearing off. He experience any unusual bleeding, difficulty breathing, or loss of the ability to control your bowel and bladder. (This applies to Spinal procedures only) You experience any redness, swelling, heat, red streaks, elevated temperature, fever, or any other signs of a possible infection.  Emergency Numbers: [place] hours (Monday - Thursday, 8:00 AM - 4:00 PM) (Friday, 9:00 AM - 12:00 Noon): [phone] After hours: [phone] ____________________________________________________________________________________________  ______________________________________________________________________  Preparing for Procedure with Sedation  NOTICE: Due to recent regulatory changes, starting on July 18, 2021, procedures requiring intravenous (IV) sedation will no longer be performed at the [facility].  These types of procedures are required to be performed at [facility] ambulatory surgery facility.  We are very sorry for the inconvenience.  Procedure appointments are limited to planned procedures: No Prescription Refills. No disability issues will be discussed. No medication changes will be discussed.  Instructions: Oral Intake: Do not eat or drink anything for at least 8 hours prior to your procedure. (Exception: Blood Pressure Medication. See below.) Transportation: A driver is required. You may not drive yourself after the procedure. Blood Pressure Medicine: Do not forget to take your blood pressure medicine with a sip of water the morning of the procedure. If your Diastolic (lower reading) is above 100 mmHg, elective cases will be cancelled/rescheduled. Blood thinners: These will need to be stopped for procedures. Notify our staff if you are taking any blood thinners. Depending on which one you take, there will be specific instructions on how and when to stop  it. Diabetics on insulin: Notify the staff so that you can be scheduled 1st case in the morning. If your diabetes requires high dose insulin, take only  of your normal insulin dose the morning of the procedure and notify the staff that you have done so. Preventing infections: Shower with an antibacterial soap the morning of your procedure. Build-up your immune system: Take 1000 mg of Vitamin C with every meal (3 times a day) the day prior to your procedure. Antibiotics: Inform the staff if you have a condition or reason that requires you to take antibiotics before dental procedures. Pregnancy: If you are pregnant, call and cancel the procedure. Sickness: If you have a cold, fever, or any active infections, call and cancel the procedure. Arrival: You must be in the facility at least 30 minutes prior to your scheduled procedure. Children: Do not bring children with you. Dress appropriately: There is always the possibility that your clothing may get soiled. Valuables: Do not bring any jewelry or valuables.  Reasons to call and reschedule or cancel your procedure: (Following these recommendations will minimize the risk of a serious complication.) Surgeries: Avoid having procedures within 2 weeks of any surgery. (Avoid for 2 weeks before or after any surgery). Flu Shots: Avoid having procedures within 2 weeks of a flu shots. (Avoid for 2 weeks before or after immunizations). Barium: Avoid having a procedure within 7-10 days after having had a radiological study involving the use of radiological contrast. (Myelograms, Barium swallow or enema study). Heart attacks: Avoid any elective procedures or surgeries for the initial 6 months after a "Myocardial Infarction" (Heart Attack). Blood thinners: It is imperative that you stop these medications before procedures. Let us  know if you if you take any blood thinner.  Infection: Avoid procedures during or within two weeks of an infection (including chest colds  or gastrointestinal problems). Symptoms associated with infections include: Localized redness, fever, chills, night sweats or profuse sweating, burning sensation when voiding, cough, congestion, stuffiness, runny nose, sore throat, diarrhea, nausea, vomiting, cold or Flu symptoms, recent or current infections. It is specially important if the infection is over the area that we intend to treat. Heart and lung problems: Symptoms that may suggest an active cardiopulmonary problem include: cough, chest pain, breathing difficulties or shortness of breath, dizziness, ankle swelling, uncontrolled high or unusually low blood pressure, and/or palpitations. If you are experiencing any of these symptoms, cancel your procedure and contact your primary care physician for an evaluation.  Remember:  Regular Business hours are:  Monday to Thursday 8:00 AM to 4:00 PM  Provider's Schedule: Milinda Pointer, MD:  Procedure days: Tuesday and Thursday 7:30 AM to 4:00 PM  Gillis Santa, MD:  Procedure days: Monday and Wednesday 7:30 AM to 4:00 PM ______________________________________________________________________  ____________________________________________________________________________________________  General Risks and Possible Complications  Patient Responsibilities: It is important that you read this as it is part of your informed consent. It is our duty to inform you of the risks and possible complications associated with treatments offered to you. It is your responsibility as a patient to read this and to ask questions about anything that is not clear or that you believe was not covered in this document.  Patient's Rights: You have the right to refuse treatment. You also have the right to change your mind, even after initially having agreed to have the treatment done. However, under this last option, if you wait until the last second to change your mind, you may be charged for the materials used up to that  point.  Introduction: Medicine is not an Chief Strategy Officer. Everything in Medicine, including the lack of treatment(s), carries the potential for danger, harm, or loss (which is by definition: Risk). In Medicine, a complication is a secondary problem, condition, or disease that can aggravate an already existing one. All treatments carry the risk of possible complications. The fact that a side effects or complications occurs, does not imply that the treatment was conducted incorrectly. It must be clearly understood that these can happen even when everything is done following the highest safety standards.  No treatment: You can choose not to proceed with the proposed treatment alternative. The "PRO(s)" would include: avoiding the risk of complications associated with the therapy. The "CON(s)" would include: not getting any of the treatment benefits. These benefits fall under one of three categories: diagnostic; therapeutic; and/or palliative. Diagnostic benefits include: getting information which can ultimately lead to improvement of the disease or symptom(s). Therapeutic benefits are those associated with the successful treatment of the disease. Finally, palliative benefits are those related to the decrease of the primary symptoms, without necessarily curing the condition (example: decreasing the pain from a flare-up of a chronic condition, such as incurable terminal cancer).  General Risks and Complications: These are associated to most interventional treatments. They can occur alone, or in combination. They fall under one of the following six (6) categories: no benefit or worsening of symptoms; bleeding; infection; nerve damage; allergic reactions; and/or death. No benefits or worsening of symptoms: In Medicine there are no guarantees, only probabilities. No healthcare provider can ever guarantee that a medical treatment will work, they can only state the probability that it may. Furthermore, there  is always the  possibility that the condition may worsen, either directly, or indirectly, as a consequence of the treatment. Bleeding: This is more common if the patient is taking a blood thinner, either prescription or over the counter (example: Goody Powders, Fish oil, Aspirin, Garlic, etc.), or if suffering a condition associated with impaired coagulation (example: Hemophilia, cirrhosis of the liver, low platelet counts, etc.). However, even if you do not have one on these, it can still happen. If you have any of these conditions, or take one of these drugs, make sure to notify your treating physician. Infection: This is more common in patients with a compromised immune system, either due to disease (example: diabetes, cancer, human immunodeficiency virus [HIV], etc.), or due to medications or treatments (example: therapies used to treat cancer and rheumatological diseases). However, even if you do not have one on these, it can still happen. If you have any of these conditions, or take one of these drugs, make sure to notify your treating physician. Nerve Damage: This is more common when the treatment is an invasive one, but it can also happen with the use of medications, such as those used in the treatment of cancer. The damage can occur to small secondary nerves, or to large primary ones, such as those in the spinal cord and brain. This damage may be temporary or permanent and it may lead to impairments that can range from temporary numbness to permanent paralysis and/or brain death. Allergic Reactions: Any time a substance or material comes in contact with our body, there is the possibility of an allergic reaction. These can range from a mild skin rash (contact dermatitis) to a severe systemic reaction (anaphylactic reaction), which can result in death. Death: In general, any medical intervention can result in death, most of the time due to an unforeseen  complication. ____________________________________________________________________________________________

## 2022-09-28 NOTE — Progress Notes (Signed)
Nursing Pain Medication Assessment:  Safety precautions to be maintained throughout the outpatient stay will include: orient to surroundings, keep bed in low position, maintain call bell within reach at all times, provide assistance with transfer out of bed and ambulation.  Medication Inspection Compliance: Wanda Martinez did not comply with our request to bring her pills to be counted. She was reminded that bringing the medication bottles, even when empty, is a requirement.  Medication: None brought in. Pill/Patch Count: None available to be counted. Bottle Appearance: No container available. Did not bring bottle(s) to appointment. Filled Date: 08/15/22 Last Medication intake:  Today

## 2022-09-29 ENCOUNTER — Other Ambulatory Visit: Payer: Self-pay | Admitting: Internal Medicine

## 2022-09-29 ENCOUNTER — Telehealth: Payer: Self-pay

## 2022-09-29 DIAGNOSIS — D649 Anemia, unspecified: Secondary | ICD-10-CM

## 2022-09-29 DIAGNOSIS — E871 Hypo-osmolality and hyponatremia: Secondary | ICD-10-CM

## 2022-09-29 NOTE — Assessment & Plan Note (Signed)
Blood pressure as outlined.  120/68 - checked by me.  Continue current medications:  Hctz, metoprolol, amlodipine and losartan.  Follow metabolic panel

## 2022-09-29 NOTE — Assessment & Plan Note (Signed)
Followed by neurology.   

## 2022-09-29 NOTE — Assessment & Plan Note (Signed)
Avoid antiinflammatories.  Stay hdrated.  Follow metabolic panel.   

## 2022-09-29 NOTE — Telephone Encounter (Signed)
Post procedure phone call.  Patient states she is much better.

## 2022-09-29 NOTE — Progress Notes (Signed)
Order placed for f/u labs.  

## 2022-09-29 NOTE — Assessment & Plan Note (Signed)
Continue prolia.  Check vitamin D level.

## 2022-09-29 NOTE — Assessment & Plan Note (Signed)
Continue crestor 

## 2022-09-29 NOTE — Assessment & Plan Note (Signed)
No upper symptoms reported.  On protonix.   

## 2022-09-29 NOTE — Assessment & Plan Note (Signed)
On crestor.  Follow lipid panel and liver function tests.   

## 2022-10-01 ENCOUNTER — Encounter: Payer: Self-pay | Admitting: Internal Medicine

## 2022-10-01 NOTE — Assessment & Plan Note (Signed)
Saw Dr Ubaldo Glassing.  On increased metoprolol.  No problems reported today.

## 2022-10-01 NOTE — Assessment & Plan Note (Signed)
Low carb diet and exercise.  Follow met b and a1c.

## 2022-10-01 NOTE — Assessment & Plan Note (Signed)
Continue citalopram.  Overall appears to be handling things well.  Follow.   

## 2022-10-01 NOTE — Assessment & Plan Note (Signed)
On crestor.  Follow lipid panel and liver function tests.   

## 2022-10-01 NOTE — Assessment & Plan Note (Signed)
Recheck cbc today.  Has been stable around 11.  Sees hematology - recommended if stable no further w/up.

## 2022-10-02 ENCOUNTER — Other Ambulatory Visit (INDEPENDENT_AMBULATORY_CARE_PROVIDER_SITE_OTHER): Payer: Medicare Other

## 2022-10-02 DIAGNOSIS — E871 Hypo-osmolality and hyponatremia: Secondary | ICD-10-CM | POA: Diagnosis not present

## 2022-10-02 DIAGNOSIS — D649 Anemia, unspecified: Secondary | ICD-10-CM

## 2022-10-03 LAB — CBC WITH DIFFERENTIAL/PLATELET
Basophils Absolute: 0 10*3/uL (ref 0.0–0.1)
Basophils Relative: 0.2 % (ref 0.0–3.0)
Eosinophils Absolute: 0.1 10*3/uL (ref 0.0–0.7)
Eosinophils Relative: 1.6 % (ref 0.0–5.0)
HCT: 25.8 % — ABNORMAL LOW (ref 36.0–46.0)
Hemoglobin: 8.7 g/dL — ABNORMAL LOW (ref 12.0–15.0)
Lymphocytes Relative: 20.6 % (ref 12.0–46.0)
Lymphs Abs: 1.6 10*3/uL (ref 0.7–4.0)
MCHC: 33.7 g/dL (ref 30.0–36.0)
MCV: 93.6 fl (ref 78.0–100.0)
Monocytes Absolute: 0.8 10*3/uL (ref 0.1–1.0)
Monocytes Relative: 10.3 % (ref 3.0–12.0)
Neutro Abs: 5.2 10*3/uL (ref 1.4–7.7)
Neutrophils Relative %: 67.3 % (ref 43.0–77.0)
Platelets: 386 10*3/uL (ref 150.0–400.0)
RBC: 2.75 Mil/uL — ABNORMAL LOW (ref 3.87–5.11)
RDW: 16.4 % — ABNORMAL HIGH (ref 11.5–15.5)
WBC: 7.7 10*3/uL (ref 4.0–10.5)

## 2022-10-03 LAB — SODIUM: Sodium: 131 mEq/L — ABNORMAL LOW (ref 135–145)

## 2022-10-03 LAB — IBC + FERRITIN
Ferritin: 174.7 ng/mL (ref 10.0–291.0)
Iron: 92 ug/dL (ref 42–145)
Saturation Ratios: 30 % (ref 20.0–50.0)
TIBC: 306.6 ug/dL (ref 250.0–450.0)
Transferrin: 219 mg/dL (ref 212.0–360.0)

## 2022-10-03 LAB — VITAMIN B12: Vitamin B-12: 439 pg/mL (ref 211–911)

## 2022-10-05 ENCOUNTER — Telehealth: Payer: Self-pay | Admitting: Internal Medicine

## 2022-10-05 DIAGNOSIS — R944 Abnormal results of kidney function studies: Secondary | ICD-10-CM

## 2022-10-05 DIAGNOSIS — E871 Hypo-osmolality and hyponatremia: Secondary | ICD-10-CM

## 2022-10-05 NOTE — Telephone Encounter (Signed)
Patient has a lab appt 10/11/2022, there are no orders in.

## 2022-10-06 NOTE — Addendum Note (Signed)
Addended by: Alisa Graff on: 10/06/2022 03:50 AM   Modules accepted: Orders

## 2022-10-06 NOTE — Telephone Encounter (Signed)
Order placed for f/u labs.  

## 2022-10-09 ENCOUNTER — Inpatient Hospital Stay: Payer: Medicare Other | Attending: Oncology | Admitting: Oncology

## 2022-10-09 ENCOUNTER — Inpatient Hospital Stay: Payer: Medicare Other

## 2022-10-09 ENCOUNTER — Other Ambulatory Visit: Payer: Self-pay

## 2022-10-09 ENCOUNTER — Encounter: Payer: Self-pay | Admitting: Oncology

## 2022-10-09 VITALS — BP 146/67 | HR 59 | Temp 98.2°F | Resp 16 | Ht 64.0 in | Wt 188.4 lb

## 2022-10-09 DIAGNOSIS — M2550 Pain in unspecified joint: Secondary | ICD-10-CM | POA: Diagnosis not present

## 2022-10-09 DIAGNOSIS — Z79811 Long term (current) use of aromatase inhibitors: Secondary | ICD-10-CM | POA: Insufficient documentation

## 2022-10-09 DIAGNOSIS — M810 Age-related osteoporosis without current pathological fracture: Secondary | ICD-10-CM | POA: Insufficient documentation

## 2022-10-09 DIAGNOSIS — Z9013 Acquired absence of bilateral breasts and nipples: Secondary | ICD-10-CM | POA: Insufficient documentation

## 2022-10-09 DIAGNOSIS — D649 Anemia, unspecified: Secondary | ICD-10-CM | POA: Diagnosis not present

## 2022-10-09 DIAGNOSIS — Z79899 Other long term (current) drug therapy: Secondary | ICD-10-CM | POA: Diagnosis not present

## 2022-10-09 DIAGNOSIS — Z170 Estrogen receptor positive status [ER+]: Secondary | ICD-10-CM | POA: Insufficient documentation

## 2022-10-09 DIAGNOSIS — C50911 Malignant neoplasm of unspecified site of right female breast: Secondary | ICD-10-CM | POA: Insufficient documentation

## 2022-10-09 LAB — RETICULOCYTES
Immature Retic Fract: 32 % — ABNORMAL HIGH (ref 2.3–15.9)
RBC.: 2.6 MIL/uL — ABNORMAL LOW (ref 3.87–5.11)
Retic Count, Absolute: 144 10*3/uL (ref 19.0–186.0)
Retic Ct Pct: 5.5 % — ABNORMAL HIGH (ref 0.4–3.1)

## 2022-10-09 LAB — CBC
HCT: 26.2 % — ABNORMAL LOW (ref 36.0–46.0)
Hemoglobin: 8.1 g/dL — ABNORMAL LOW (ref 12.0–15.0)
MCH: 30.8 pg (ref 26.0–34.0)
MCHC: 30.9 g/dL (ref 30.0–36.0)
MCV: 99.6 fL (ref 80.0–100.0)
Platelets: 229 10*3/uL (ref 150–400)
RBC: 2.63 MIL/uL — ABNORMAL LOW (ref 3.87–5.11)
RDW: 16.5 % — ABNORMAL HIGH (ref 11.5–15.5)
WBC: 5.8 10*3/uL (ref 4.0–10.5)
nRBC: 0.3 % — ABNORMAL HIGH (ref 0.0–0.2)

## 2022-10-09 LAB — FOLATE: Folate: 9.5 ng/mL (ref >5.9)

## 2022-10-09 LAB — DAT, POLYSPECIFIC AHG (ARMC ONLY): Polyspecific AHG test: NEGATIVE

## 2022-10-09 LAB — LACTATE DEHYDROGENASE: LDH: 200 U/L — ABNORMAL HIGH (ref 98–192)

## 2022-10-09 NOTE — Progress Notes (Signed)
[name]  [phone] [phone]) [phone]  ID: Wanda Martinez OB: [birth year]  MR#: [id number]  TUU#:[id number]  Patient Care Team: Einar Pheasant, MD as PCP - General (Internal Medicine)  CHIEF COMPLAINT: History of breast cancer, anemia unspecified.  INTERVAL HISTORY: Patient returns to clinic as an add-on after her primary care physician noted that her hemoglobin has been trending down.  She is anxious, but otherwise feels well.  She does not complain of any weakness or fatigue.  She continues to have tremor from her Parkinson's, but no other neurologic complaints.  She denies any recent fevers or illnesses.  She has a good appetite and denies weight loss.  She denies any chest pain, shortness of breath, cough, or hemoptysis.  She denies any nausea, vomiting, constipation, or diarrhea.  She has no urinary complaints.  Patient is no further specific complaints today.  REVIEW OF SYSTEMS:   Review of Systems  Constitutional: Negative.  Negative for fever, malaise/fatigue and weight loss.  Respiratory: Negative.  Negative for cough and shortness of breath.   Cardiovascular: Negative.  Negative for chest pain and leg swelling.  Gastrointestinal: Negative.  Negative for abdominal pain.  Genitourinary: Negative.   Musculoskeletal: Negative.  Negative for back pain and joint pain.  Skin: Negative.  Negative for rash.  Neurological:  Positive for tremors. Negative for sensory change, speech change, focal weakness and weakness.  Psychiatric/Behavioral:  Negative for depression. The patient is nervous/anxious.     As per HPI. Otherwise, a complete review of systems is negative.  PAST MEDICAL HISTORY: Past Medical History:  Diagnosis Date   Acute postoperative pain 10/25/2017   Anemia    Arm pain 07/26/2015   Arthritis    Arthritis, degenerative 03/26/2014   Back pain 11/01/2013   Bladder infection 06/2018   Breast cancer ([place])    Masectomy - left -  1986    Breast cancer [facility])    Mastectomy-right -2014   CHEST PAIN 04/29/2010   Qualifier: Diagnosis of  By: Wynetta Emery RN, Erika     Chronic cystitis    Cystocele 02/22/2013   Cystocele, midline 08/19/2013   Degeneration of intervertebral disc of lumbosacral region 03/26/2014   DYSPNEA 04/29/2010   Qualifier: Diagnosis of  By: Wynetta Emery RN, Erika     Enthesopathy of hip 03/26/2014   GERD (gastroesophageal reflux disease)    Hiatal hernia    HTN (hypertension)    Hypokalemia 06/2018   Hyponatremia 06/2018   LBP (low back pain) 03/26/2014   Neck pain 11/01/2013   Parkinson disease    Pneumonia 06/2018   Sinusitis 02/07/2015   Skin lesions 07/12/2014   Urinary incontinence    mixed     PAST SURGICAL HISTORY: Past Surgical History:  Procedure Laterality Date   ABDOMINAL HYSTERECTOMY     BREAST BIOPSY  2013   BREAST ENHANCEMENT SURGERY  1987   BREAST IMPLANT REMOVAL     BREAST IMPLANT REMOVAL Right 08/29/2012   BREAST SURGERY  1986   s/p left mastectomy   COLONOSCOPY WITH PROPOFOL N/A 09/13/2016   Procedure: COLONOSCOPY WITH PROPOFOL;  Surgeon: Manya Silvas, MD;  Location: [facility] ENDOSCOPY;  Service: Endoscopy;  Laterality: N/A;   MASTECTOMY  08/2012   right   TONSILLECTOMY AND ADENOIDECTOMY  54   VESICOVAGINAL FISTULA CLOSURE W/ TAH  1983    FAMILY HISTORY Family History  Problem Relation Age of Onset   Diabetes Father    Stroke Father    Colon polyps Father  Stroke Mother    Parkinson's disease Mother        ADVANCED DIRECTIVES:    HEALTH MAINTENANCE: Social History   Tobacco Use   Smoking status: Never   Smokeless tobacco: Never   Tobacco comments:    tobacco use - no  Vaping Use   Vaping Use: Never used  Substance Use Topics   Alcohol use: No    Alcohol/week: 0.0 standard drinks of alcohol   Drug use: No     Allergies  Allergen Reactions   Sulfa Antibiotics Nausea Only   Vesicare [Solifenacin] Other (See Comments)    Constipation Constipation     Current Outpatient Medications  Medication Sig Dispense Refill   amLODipine (NORVASC) 2.5 MG tablet Take 1 tablet (2.5 mg total) by mouth daily. 30 tablet 0   aspirin 81 MG EC tablet Take 81 mg by mouth daily as needed.     calcium carbonate (OSCAL) 1500 (600 Ca) MG TABS tablet Take 1 tablet by mouth daily.     citalopram (CELEXA) 20 MG tablet TAKE 1 TABLET BY MOUTH ONCE DAILY. 30 tablet 11   cyclobenzaprine (FLEXERIL) 10 MG tablet TAKE ONE TABLET BY MOUTH AT BEDTIME. 30 tablet 11   donepezil (ARICEPT) 10 MG tablet TAKE 1 TABLET BY MOUTH ONCE NIGHTLY. 30 tablet 11   gabapentin (NEURONTIN) 300 MG capsule TAKE (2) CAPSULES BY MOUTH TWICE DAILY 120 capsule 11   GLUCOSAMINE SULFATE PO Take by mouth daily.     Glucosamine-Chondroitin 500-400 MG CAPS Take 2 capsules by mouth daily.     hydrochlorothiazide (HYDRODIURIL) 25 MG tablet TAKE 1 TABLET BY MOUTH ONCE DAILY. (PATIENT TAKES 1/2 ONCE DAILY AND EXTRA 1/2 IF NEEDED) 15 tablet 11   letrozole (FEMARA) 2.5 MG tablet Take 2.5 mg by mouth daily.     losartan (COZAAR) 50 MG tablet TAKE (2) TABLETS BY MOUTH ONCE DAILY. 60 tablet 11   lubiprostone (AMITIZA) 24 MCG capsule TAKE 1 CAPSULE BY MOUTH ONCE DAILY WITH BREAKFAST.     magnesium oxide (MAG-OX) 400 (240 Mg) MG tablet TAKE 1 TABLET BY MOUTH ONCE DAILY. 30 tablet 11   melatonin 1 MG TABS tablet Take 3 mg by mouth at bedtime.     meloxicam (MOBIC) 7.5 MG tablet TAKE 1 TABLET BY MOUTH ONCE DAILY. 30 tablet 4   metoprolol succinate (TOPROL-XL) 25 MG 24 hr tablet TAKE 1 TABLET BY MOUTH TWICE DAILY 60 tablet 11   Multiple Vitamin (MULTIVITAMIN) tablet Take 1 tablet by mouth daily.     nitrofurantoin, macrocrystal-monohydrate, (MACROBID) 100 MG capsule Take 1 capsule (100 mg total) by mouth daily. 90 capsule 3   Omega-3 1000 MG CAPS Take by mouth.     omeprazole (PRILOSEC) 20 MG capsule TAKE 1 CAPSULE BY MOUTH TWICE DAILY FOR REFLUX. 60 capsule 1   pantoprazole (PROTONIX) 40 MG tablet TAKE 1 TABLET  BY MOUTH ONCE DAILY. 30 tablet 11   rosuvastatin (CRESTOR) 20 MG tablet TAKE 1 TABLET BY MOUTH ONCE DAILY. 30 tablet 11   HYDROcodone-acetaminophen (NORCO/VICODIN) 5-325 MG tablet Take 1 tablet by mouth 3 (three) times daily. Must last 30 days. 90 tablet 0   HYDROcodone-acetaminophen (NORCO/VICODIN) 5-325 MG tablet Take 1 tablet by mouth 3 (three) times daily. Must last 30 days. 90 tablet 0   HYDROcodone-acetaminophen (NORCO/VICODIN) 5-325 MG tablet Take 1 tablet by mouth 3 (three) times daily. Must last 30 days. 90 tablet 0   traMADol (ULTRAM) 50 MG tablet Take 1 tablet (50 mg total) by mouth  every 8 (eight) hours as needed for up to 7 days for severe pain. Must last 7 days. (Patient not taking: Reported on 10/09/2022) 21 tablet 0   No current facility-administered medications for this visit.    OBJECTIVE: Vitals:   10/09/22 0921  BP: (!) 146/67  Pulse: (!) 59  Resp: 16  Temp: 98.2 F (36.8 C)  SpO2: 100%     Body mass index is 32.34 kg/m.    ECOG FS:0 - Asymptomatic  General: Well-developed, well-nourished, no acute distress. Eyes: Pink conjunctiva, anicteric sclera. HEENT: Normocephalic, moist mucous membranes. Lungs: No audible wheezing or coughing. Heart: Regular rate and rhythm. Abdomen: Soft, nontender, no obvious distention. Musculoskeletal: No edema, cyanosis, or clubbing. Neuro: Alert, answering all questions appropriately. Cranial nerves grossly intact. Skin: No rashes or petechiae noted. Psych: Normal affect.  LAB RESULTS:  Lab Results  Component Value Date   NA 131 (L) 10/02/2022   K 3.8 09/26/2022   CL 97 09/26/2022   CO2 27 09/26/2022   GLUCOSE 87 09/26/2022   BUN 22 09/26/2022   CREATININE 1.40 (H) 09/26/2022   CALCIUM 9.5 09/26/2022   PROT 7.8 09/26/2022   ALBUMIN 4.0 09/26/2022   AST 22 09/26/2022   ALT 16 09/26/2022   ALKPHOS 34 (L) 09/26/2022   BILITOT 0.4 09/26/2022   GFRNONAA 47 (L) 05/09/2022   GFRAA >60 07/27/2020    Lab Results   Component Value Date   WBC 7.7 10/02/2022   NEUTROABS 5.2 10/02/2022   HGB 8.7 (L) 10/02/2022   HCT 25.8 (L) 10/02/2022   MCV 93.6 10/02/2022   PLT 386.0 10/02/2022     STUDIES: DG PAIN CLINIC C-ARM 1-60 MIN NO REPORT  Result Date: 09/28/2022 Fluoro was used, but no Radiologist interpretation will be provided. Please refer to "NOTES" tab for provider progress note.   ASSESSMENT: History of breast cancer, anemia unspecified.  PLAN:    Anemia: Patient's hemoglobin was noted to trend down below 9.0 with normal iron panel and B12.  Possibly related to her underlying renal insufficiency.  Full laboratory work-up from today is pending at time of dictation.  Patient has been instructed to keep her previously scheduled follow-up appointment for Zometa in November for further evaluation and discussion of her laboratory results. Osteoporosis: Patient's most recent bone mineral density on July 05, 2022 reported T score of -2.7 which is essentially unchanged from previous where her T score was -2.6 and -2.9 respectively.  Patient previously received Prolia, but this has been switched to Zometa secondary to insurance purposes.  Zometa is dose reduced secondary to her renal insufficiency.  Continue calcium and vitamin D supplementation.  Return to clinic as previously scheduled in November for continuation of treatment.  Repeat bone mineral density in July 2024. 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  Patient completed 5 years of letrozole in May 2019.  No further interventions are needed.  Patient has had bilateral mastectomies, therefore no further mammograms are necessary.  Return to clinic as scheduled for Prolia. Back/joint pain: Continue symptomatic treatment with pain clinic.   Patient expressed understanding and was in agreement with this plan. She also understands that She  can call clinic at any time with any questions, concerns, or complaints.   Breast cancer [facility])   Staging form: Breast, AJCC 7th Edition     Clinical stage from 09/22/2015: Stage IIA (T1c, N1, M0) - Signed by Christia Reading  Bridget Hartshorn, MD on 09/22/2015   Lloyd Huger, MD   10/09/2022 10:13 AM

## 2022-10-10 LAB — ERYTHROPOIETIN: Erythropoietin: 60.1 m[IU]/mL — ABNORMAL HIGH (ref 2.6–18.5)

## 2022-10-10 LAB — HAPTOGLOBIN: Haptoglobin: 209 mg/dL (ref 41–333)

## 2022-10-11 ENCOUNTER — Other Ambulatory Visit: Payer: Self-pay | Admitting: *Deleted

## 2022-10-11 ENCOUNTER — Other Ambulatory Visit: Payer: Medicare Other

## 2022-10-11 LAB — PROTEIN ELECTROPHORESIS, SERUM
A/G Ratio: 1.2 (ref 0.7–1.7)
Albumin ELP: 3.6 g/dL (ref 2.9–4.4)
Alpha-1-Globulin: 0.2 g/dL (ref 0.0–0.4)
Alpha-2-Globulin: 0.8 g/dL (ref 0.4–1.0)
Beta Globulin: 0.7 g/dL (ref 0.7–1.3)
Gamma Globulin: 1.2 g/dL (ref 0.4–1.8)
Globulin, Total: 3 g/dL (ref 2.2–3.9)
M-Spike, %: 0.9 g/dL — ABNORMAL HIGH
Total Protein ELP: 6.6 g/dL (ref 6.0–8.5)

## 2022-10-11 MED ORDER — PANTOPRAZOLE SODIUM 40 MG PO TBEC: 40.0000 mg | DELAYED_RELEASE_TABLET | Freq: Every day | ORAL | 11 refills | Status: DC

## 2022-10-30 ENCOUNTER — Telehealth: Payer: Self-pay | Admitting: Pain Medicine

## 2022-10-30 LAB — INTELLIGEN MYELOID

## 2022-10-30 NOTE — Telephone Encounter (Signed)
PT wanted to see about getting an injection in her shoulder. PT stated that she is in a lot of pain. PT wants to see if she can come in before Wednesday due to the fact that she will be leaving to go out of town on Thursday with her daughter. Please give patient a call. Thanks

## 2022-11-13 ENCOUNTER — Other Ambulatory Visit: Payer: Self-pay | Admitting: Oncology

## 2022-11-13 ENCOUNTER — Other Ambulatory Visit: Payer: Self-pay

## 2022-11-13 DIAGNOSIS — D649 Anemia, unspecified: Secondary | ICD-10-CM

## 2022-11-13 DIAGNOSIS — M818 Other osteoporosis without current pathological fracture: Secondary | ICD-10-CM

## 2022-11-14 ENCOUNTER — Inpatient Hospital Stay: Payer: Medicare Other | Attending: Oncology

## 2022-11-14 ENCOUNTER — Inpatient Hospital Stay: Payer: Medicare Other | Admitting: Oncology

## 2022-11-14 ENCOUNTER — Ambulatory Visit: Payer: Medicare Other | Admitting: Oncology

## 2022-11-14 ENCOUNTER — Inpatient Hospital Stay: Payer: Medicare Other

## 2022-11-28 ENCOUNTER — Telehealth: Payer: Self-pay

## 2022-11-28 IMAGING — US US RENAL
1 series · 15 of 25 positions shown · non-contrast
Comparison: None.

CLINICAL DATA: Recurrent UTI

EXAM:
RENAL / URINARY TRACT ULTRASOUND COMPLETE

[Series 1: us renal · 15 of 41 slices shown]
[im 1/41]
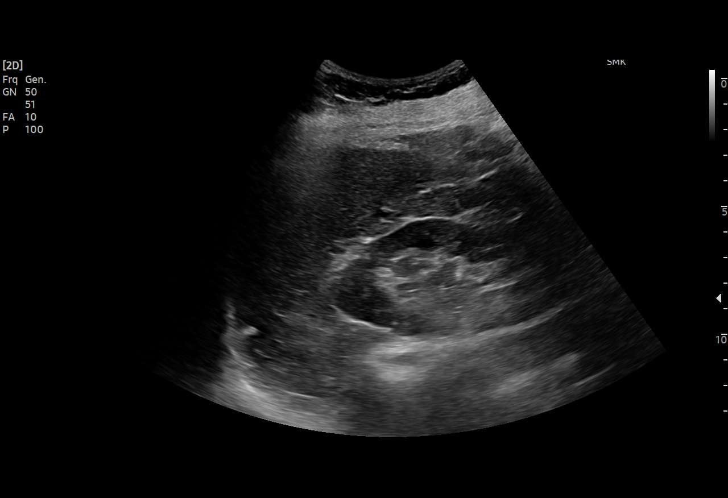
[im 4/41]
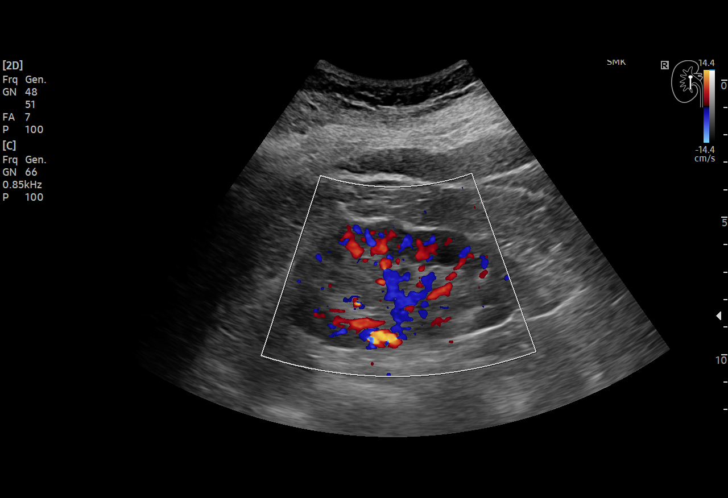
[im 7/41]
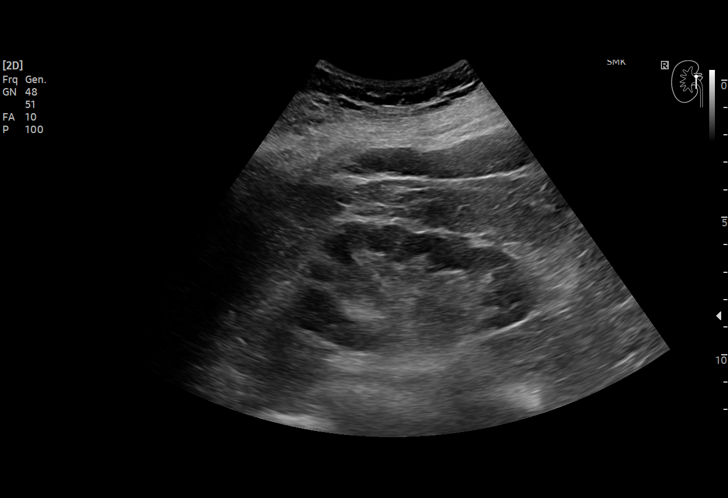
[im 9/41]
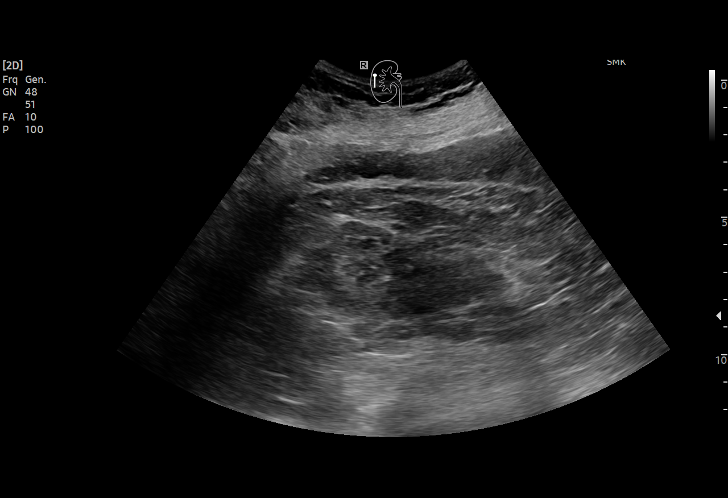
[im 12/41]
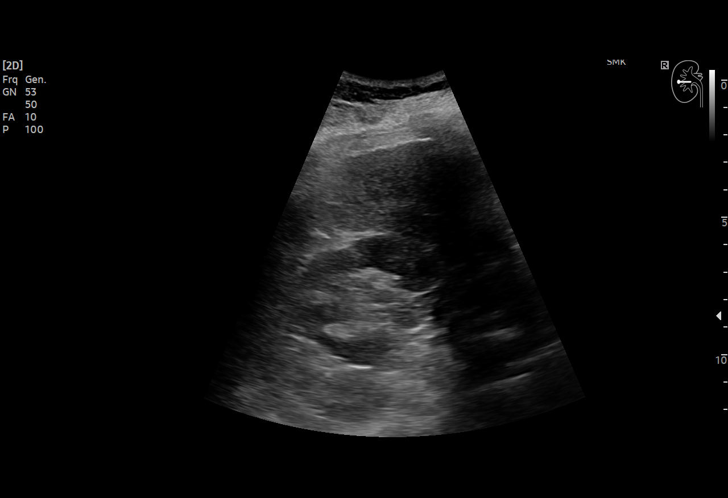
[im 16/41]
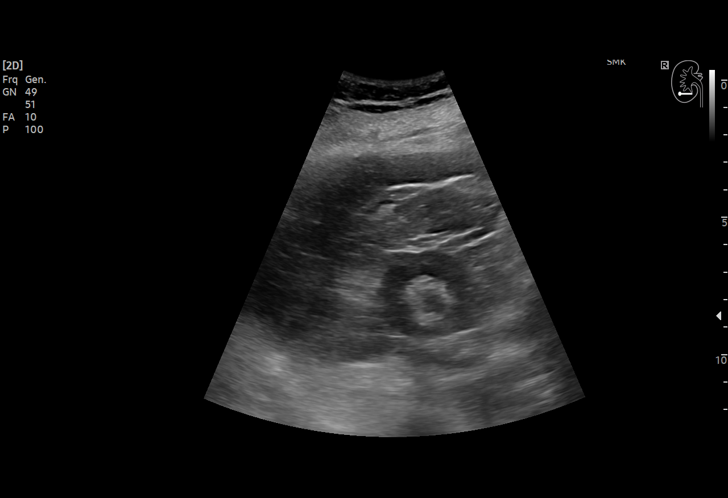
[im 17/41]
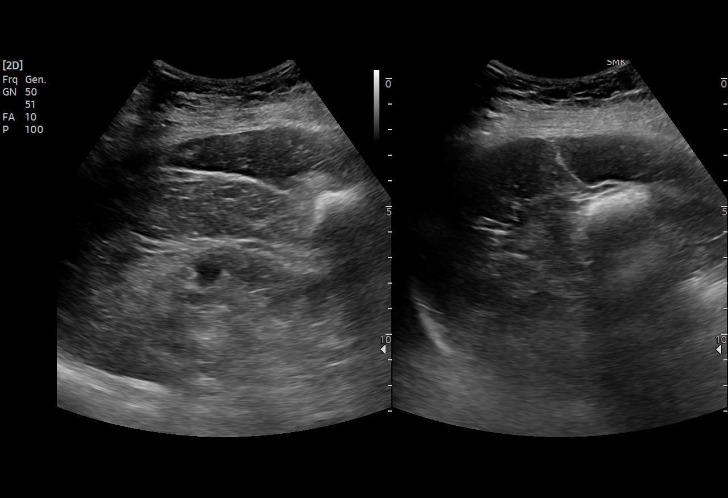
[im 21/41]
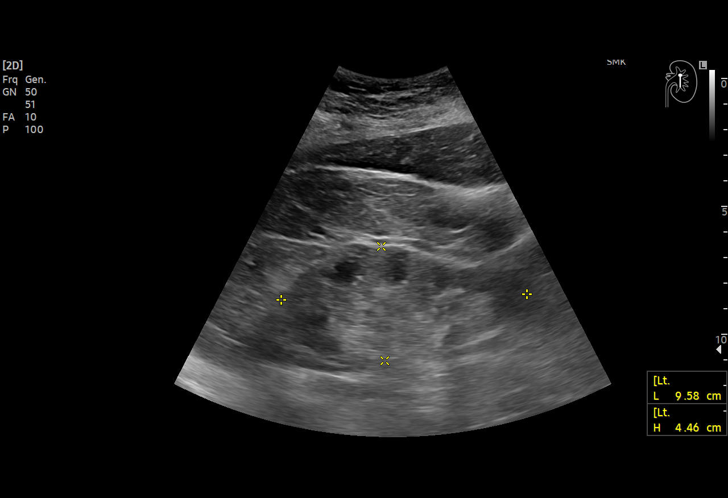
[im 24/41]
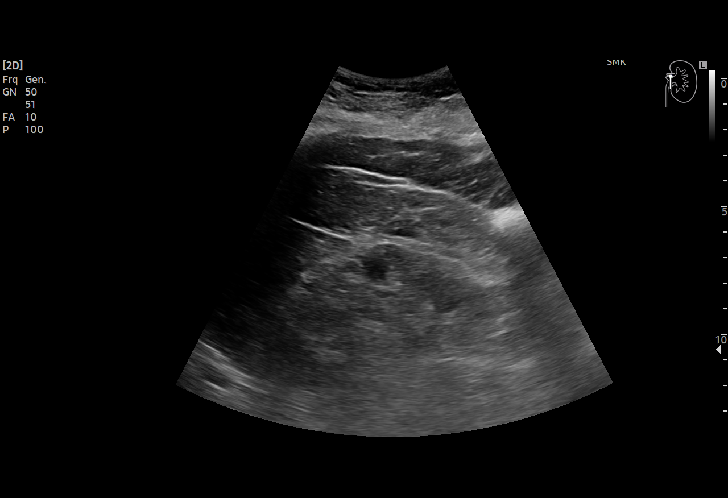
[im 26/41]
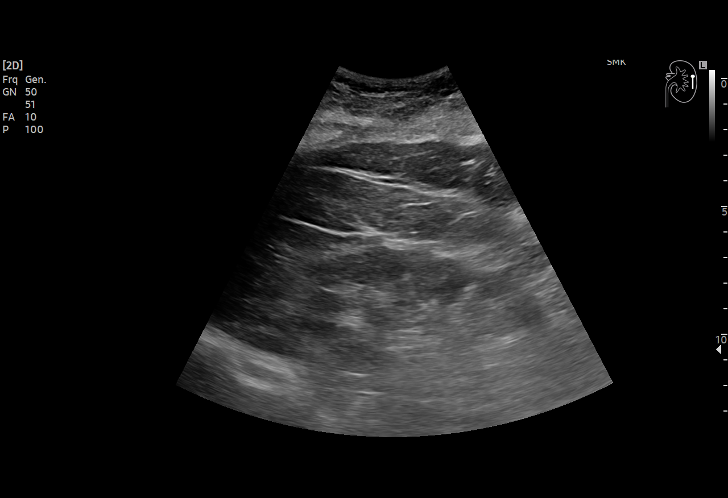
[im 29/41]
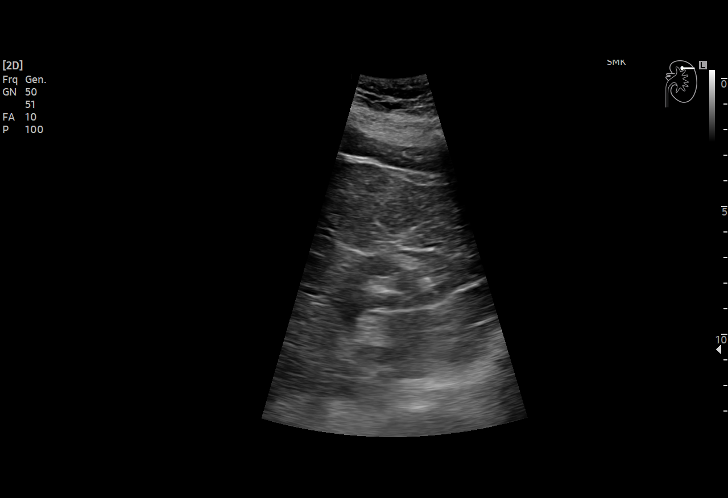
[im 32/41]
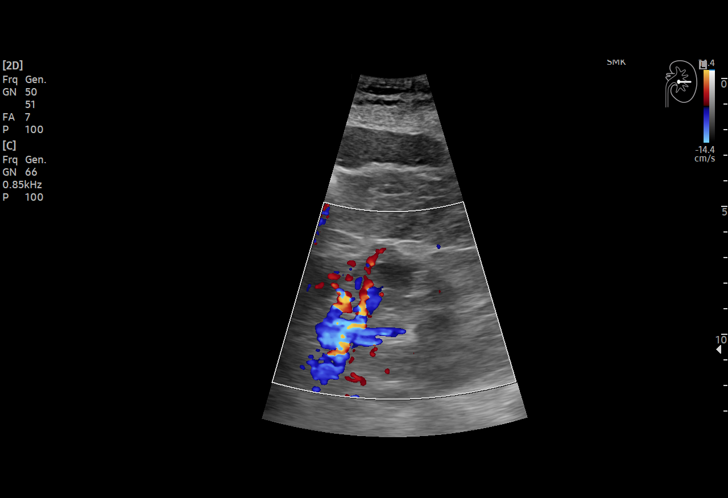
[im 34/41]
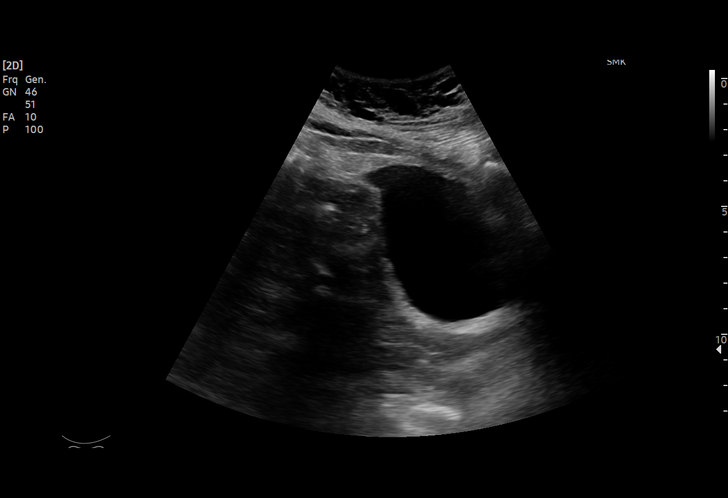
[im 37/41]
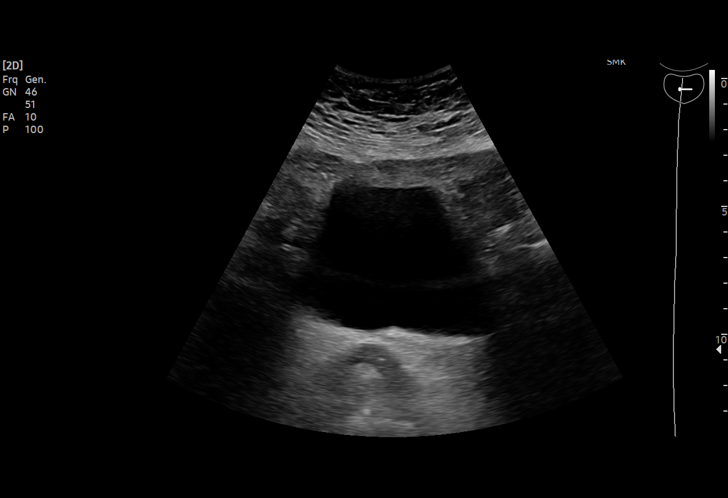
[im 41/41]
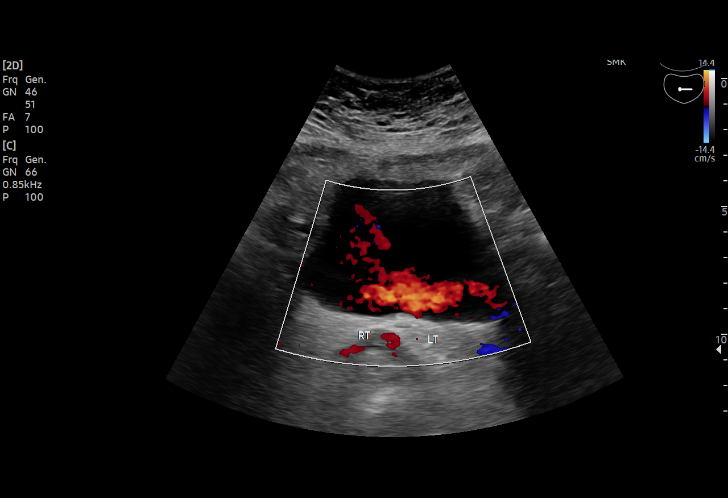

[15 of 25 positions shown; findings below may reference images not displayed]

FINDINGS: Right Kidney:

Renal measurements: 10.0 x 4.3 x 4.2 cm = volume: 96 mL.
Echogenicity within normal limits. No mass or hydronephrosis
visualized.

Left Kidney:

Renal measurements: 9.6 x 4.5 x 4.7 cm = volume: 104 mL. Increased
cortical echogenicity.

Bladder:

Appears normal for degree of bladder distention.

Other:

None.
IMPRESSION: 1. Mild increased cortical echogenicity associated with the left
kidney, a nonspecific finding.
2. No other abnormalities.

## 2022-11-29 ENCOUNTER — Encounter: Payer: Medicare Other | Admitting: Pain Medicine

## 2022-11-29 NOTE — Patient Instructions (Incomplete)
____________________________________________________________________________________________  Patient Information update  To: All of our patients.  Re: Name change.  It has been made official that our current name, "[facility]"   will soon be changed to "[facility]".   The purpose of this change is to eliminate any confusion created by the concept of our practice being a "Medication Management Pain Clinic". In the past this has led to the misconception that we treat pain primarily by the use of prescription medications.  Nothing can be farther from the truth.   Understanding PAIN MANAGEMENT: To further understand what our practice does, you first have to understand that "Pain Management" is a subspecialty that requires additional training once a physician has completed their specialty training, which can be in either Anesthesia, Neurology, Psychiatry, or Physical Medicine and Rehabilitation (PMR). Each one of these contributes to the final approach taken by each physician to the management of their patient's pain. To be a "Pain Management Specialist" you must have first completed one of the specialty trainings below.  Anesthesiologists - trained in clinical pharmacology and interventional techniques such as nerve blockade and regional as well as central neuroanatomy. They are trained to block pain before, during, and after surgical interventions.  Neurologists - trained in the diagnosis and pharmacological treatment of complex neurological conditions, such as Multiple Sclerosis, Parkinson's, spinal cord injuries, and other systemic conditions that may be associated with symptoms that may include but are not limited to pain. They tend to rely primarily on the treatment of chronic pain using prescription medications.  Psychiatrist - trained in conditions affecting the psychosocial  wellbeing of patients including but not limited to depression, anxiety, schizophrenia, personality disorders, addiction, and other substance use disorders that may be associated with chronic pain. They tend to rely primarily on the treatment of chronic pain using prescription medications.   Physical Medicine and Rehabilitation (PMR) physicians, also known as physiatrists - trained to treat a wide variety of medical conditions affecting the brain, spinal cord, nerves, bones, joints, ligaments, muscles, and tendons. Their training is primarily aimed at treating patients that have suffered injuries that have caused severe physical impairment. Their training is primarily aimed at the physical therapy and rehabilitation of those patients. They may also work alongside orthopedic surgeons or neurosurgeons using their expertise in assisting surgical patients to recover after their surgeries.  INTERVENTIONAL PAIN MANAGEMENT is sub-subspecialty of Pain Management.  Our physicians are Board-certified in Anesthesia, Pain Management, and Interventional Pain Management.  This meaning that not only have they been trained and Board-certified in their specialty of Anesthesia, and subspecialty of Pain Management, but they have also received further training in the sub-subspecialty of Interventional Pain Management, in order to become Board-certified as INTERVENTIONAL PAIN MANAGEMENT SPECIALIST.    Mission: Our goal is to use our skills in  INTERVENTIONAL PAIN MANAGEMENT as alternatives to the chronic use of prescription opioid medications for the treatment of pain. To make this more clear, we have changed our name to reflect what we do and offer. We will continue to offer medication management assessment and recommendations, but we will not be taking over any patient's medication management.  ____________________________________________________________________________________________      ____________________________________________________________________________________________  National Pain Medication Shortage  The [place] is experiencing worsening drug shortages. These have had a negative widespread effect on patient care and treatment. Not expected to improve any time soon. Predicted to last past 2029.   Drug shortage list (generic   names) Oxycodone IR Oxycodone/APAP Oxymorphone IR Hydromorphone Hydrocodone/APAP Morphine  Where is the problem?  Manufacturing and supply level.  Will this shortage affect you?  Only if you take any of the above pain medications.  How? You may be unable to fill your prescription.  Your pharmacist may offer a "partial fill" of your prescription. (Warning: Do not accept partial fills.) Prescriptions partially filled cannot be transferred to another pharmacy. Read our Medication Rules and Regulation. Depending on how much medicine you are dependent on, you may experience withdrawals when unable to get the medication.  Recommendations: Consider ending your dependence on opioid pain medications. Ask your pain specialist to assist you with the process. Consider switching to a medication currently not in shortage, such as Buprenorphine. Talk to your pain specialist about this option. Consider decreasing your pain medication requirements by managing tolerance thru "Drug Holidays". This may help minimize withdrawals, should you run out of medicine. Control your pain thru the use of non-pharmacological interventional therapies.   Your prescriber: Prescribers cannot be blamed for shortages. Medication manufacturing and supply issues cannot be fixed by the prescriber.   NOTE: The prescriber is not responsible for supplying the medication, or solving supply issues. Work with your pharmacist to solve it. The patient is responsible for the decision to take or continue taking the medication and for identifying and securing a legal supply source. By  law, supplying the medication is the job and responsibility of the pharmacy. The prescriber is responsible for the evaluation, monitoring, and prescribing of these medications.   Prescribers will NOT: Re-issue prescriptions that have been partially filled. Re-issue prescriptions already sent to a pharmacy.  Re-send prescriptions to a different pharmacy because yours did not have your medication. Ask pharmacist to order more medicine or transfer the prescription to another pharmacy. (Read below.)  New 2023 regulation: "August 18, 2022 Revised Regulation Allows DEA-Registered Pharmacies to Transfer Electronic Prescriptions at a Patient's Request DEA Headquarters Division - Public Information Office Patients now have the ability to request their electronic prescription be transferred to another pharmacy without having to go back to their practitioner to initiate the request. This revised regulation went into effect on Monday, August 14, 2022.     At a patient's request, a DEA-registered retail pharmacy can now transfer an electronic prescription for a controlled substance (schedules II-V) to another DEA-registered retail pharmacy. Prior to this change, patients would have to go through their practitioner to cancel their prescription and have it re-issued to a different pharmacy. The process was taxing and time consuming for both patients and practitioners.    The Drug Enforcement Administration (DEA) published its intent to revise the process for transferring electronic prescriptions on November 05, 2020.  The final rule was published in the federal register on July 13, 2022 and went into effect 30 days later.  Under the final rule, a prescription can only be transferred once between pharmacies, and only if allowed under existing state or other applicable law. The prescription must remain in its electronic form; may not be altered in any way; and the transfer must be communicated directly between  two licensed pharmacists. It's important to note, any authorized refills transfer with the original prescription, which means the entire prescription will be filled at the same pharmacy".  Reference: https://www.dea.gov/stories/2023/2023-08/2022-09-01/revised-regulation-allows-dea-registered-pharmacies-transfer (DEA website announcement)  https://www.govinfo.gov/content/pkg/FR-2022-07-13/pdf/2023-15847.pdf (Federal Register  Department of Justice)   Federal Register / Vol. 88, No. 143 / Thursday, July 13, 2022 / Rules and Regulations DEPARTMENT OF JUSTICE  Drug Enforcement   Administration  21 CFR Part 1306  [Docket No. [id number]]  RIN 1117-AB64 Transfer of Electronic Prescriptions for Schedules II-V Controlled Substances Between Pharmacies for Initial Filling  ____________________________________________________________________________________________     ____________________________________________________________________________________________  Drug Holidays  What is a "Drug Holiday"? Drug Holiday: is the name given to the process of slowly tapering down and temporarily stopping the pain medication for the purpose of decreasing or eliminating tolerance to the drug.  Benefits Improved effectiveness Decreased required effective dose Improved pain control End dependence on high dose therapy Decrease cost of therapy Uncovering "opioid-induced hyperalgesia". (OIH)  What is "opioid hyperalgesia"? It is a paradoxical increase in pain caused by exposure to opioids. Stopping the opioid pain medication, contrary to the expected, it actually decreases or completely eliminates the pain. Ref.: "A comprehensive review of opioid-induced hyperalgesia". Marion Lee, et.al. Pain Physician. 2011 Mar-Apr;14(2):145-61.  What is tolerance? Tolerance: the progressive loss of effectiveness of a pain medicine due to repetitive use. A common problem of opioid pain medications.  How long should a "Drug  Holiday" last? Effectiveness depends on the patient staying off all opioid pain medicines for a minimum of 14 consecutive days. (2 weeks)  How about just taking less of the medicine? Does not work. Will not accomplish goal of eliminating the excess receptors.  How about switching to a different pain medicine? (AKA. "Opioid rotation") Does not work. Creates the illusion of effectiveness by taking advantage of inaccurate equivalent dose calculations between different opioids. -This "technique" was promoted by studies funded by pharmaceutical companies, such as [employer], creators of "OxyContin".  Can I stop the medicine "cold turkey"? Depends. You should always coordinate with your Pain Specialist to make the transition as smoothly as possible. Avoid stopping the medicine abruptly without consulting. We recommend a "slow taper".  What is a slow taper? Taper: refers to the gradual decrease in dose.   How do I stop/taper the dose? Slowly. Decrease the daily amount of pills that you take by one (1) pill every seven (7) days. This is called a "slow downward taper". Example: if you normally take four (4) pills per day, drop it to three (3) pills per day for seven (7) days, then to two (2) pills per day for seven (7) days, then to one (1) per day for seven (7) days, and then stop the medicine. The 14 day "Drug Holiday" starts on the first day without medicine.   Will I experience withdrawals? Unlikely with a slow taper.  What triggers withdrawals? Withdrawals are triggered by the sudden/abrupt stop of high dose opioids. Withdrawals can be avoided by slowly decreasing the dose over a prolonged period of time.  What are withdrawals? Symptoms associated with sudden/abrupt reduction/stopping of high-dose, long-term use of pain medication. Withdrawal are seldom seen on low dose therapy, or patients rarely taking opioid medication.  Early Withdrawal Symptoms may include: Agitation Anxiety Muscle  aches Increased tearing Insomnia Runny nose Sweating Yawning  Late symptoms may include: Abdominal cramping Diarrhea Dilated pupils Goose bumps Nausea Vomiting  (Last update: 11/26/2022) ____________________________________________________________________________________________    _______________________________________________________________________  Medication Rules  Purpose: To inform patients, and their family members, of our medication rules and regulations.  Applies to: All patients receiving prescriptions from our practice (written or electronic).  Pharmacy of record: This is the pharmacy where your electronic prescriptions will be sent. Make sure we have the correct one.  Electronic prescriptions: In compliance with the [place] Strengthen Opioid Misuse Prevention (STOP) Act of 2017 (Session Law 2017-74/H243), effective December 18, 2018, all controlled substances must be electronically prescribed. Written prescriptions, faxing, or calling prescriptions to a pharmacy will no longer be done.  Prescription refills: These will be provided only during in-person appointments. No medications will be renewed without a "face-to-face" evaluation with your provider. Applies to all prescriptions.  NOTE: The following applies primarily to controlled substances (Opioid* Pain Medications).   Type of encounter (visit): For patients receiving controlled substances, face-to-face visits are required. (Not an option and not up to the patient.)  Patient's responsibilities: Pain Pills: Bring all pain pills to every appointment (except for procedure appointments). Pill Bottles: Bring pills in original pharmacy bottle. Bring bottle, even if empty. Always bring the bottle of the most recent fill.  Medication refills: You are responsible for knowing and keeping track of what medications you are taking and when is it that you will need a refill. The day before your appointment: write a  list of all prescriptions that need to be refilled. The day of the appointment: give the list to the admitting nurse. Prescriptions will be written only during appointments. No prescriptions will be written on procedure days. If you forget a medication: it will not be "Called in", "Faxed", or "electronically sent". You will need to get another appointment to get these prescribed. No early refills. Do not call asking to have your prescription filled early. Partial  or short prescriptions: Occasionally your pharmacy may not have enough pills to fill your prescription.  NEVER ACCEPT a partial fill or a prescription that is short of the total amount of pills that you were prescribed.  With controlled substances the law allows 72 hours for the pharmacy to complete the prescription.  If the prescription is not completed within 72 hours, the pharmacist will require a new prescription to be written. This means that you will be short on your medicine and we WILL NOT send another prescription to complete your original prescription.  Instead, request the pharmacy to send a carrier to a nearby branch to get enough medication to provide you with your full prescription. Prescription Accuracy: You are responsible for carefully inspecting your prescriptions before leaving our office. Have the discharge nurse carefully go over each prescription with you, before taking them home. Make sure that your name is accurately spelled, that your address is correct. Check the name and dose of your medication to make sure it is accurate. Check the number of pills, and the written instructions to make sure they are clear and accurate. Make sure that you are given enough medication to last until your next medication refill appointment. Taking Medication: Take medication as prescribed. When it comes to controlled substances, taking less pills or less frequently than prescribed is permitted and encouraged. Never take more pills than  instructed. Never take the medication more frequently than prescribed.  Inform other Doctors: Always inform, all of your healthcare providers, of all the medications you take. Pain Medication from other Providers: You are not allowed to accept any additional pain medication from any other Doctor or Healthcare provider. There are two exceptions to this rule. (see below) In the event that you require additional pain medication, you are responsible for notifying us, as stated below. Cough Medicine: Often these contain an opioid, such as codeine or hydrocodone. Never accept or take cough medicine containing these opioids if you are already taking an opioid* medication. The combination may cause respiratory failure and death. Medication Agreement: You are responsible for carefully reading and following our Medication Agreement. This   must be signed before receiving any prescriptions from our practice. Safely store a copy of your signed Agreement. Violations to the Agreement will result in no further prescriptions. (Additional copies of our Medication Agreement are available upon request.) Laws, Rules, & Regulations: All patients are expected to follow all Federal and State Laws, Statutes, Rules, & Regulations. Ignorance of the Laws does not constitute a valid excuse.  Illegal drugs and Controlled Substances: The use of illegal substances (including, but not limited to marijuana and its derivatives) and/or the illegal use of any controlled substances is strictly prohibited. Violation of this rule may result in the immediate and permanent discontinuation of any and all prescriptions being written by our practice. The use of any illegal substances is prohibited. Adopted CDC guidelines & recommendations: Target dosing levels will be at or below 60 MME/day. Use of benzodiazepines** is not recommended.  Exceptions: There are only two exceptions to the rule of not receiving pain medications from other Healthcare  Providers. Exception #1 (Emergencies): In the event of an emergency (i.e.: accident requiring emergency care), you are allowed to receive additional pain medication. However, you are responsible for: As soon as you are able, call our office [phone], at any time of the day or night, and leave a message stating your name, the date and nature of the emergency, and the name and dose of the medication prescribed. In the event that your call is answered by a member of our staff, make sure to document and save the date, time, and the name of the person that took your information.  Exception #2 (Planned Surgery): In the event that you are scheduled by another doctor or dentist to have any type of surgery or procedure, you are allowed (for a period no longer than 30 days), to receive additional pain medication, for the acute post-op pain. However, in this case, you are responsible for picking up a copy of our "Post-op Pain Management for Surgeons" handout, and giving it to your surgeon or dentist. This document is available at our office, and does not require an appointment to obtain it. Simply go to our office during business hours (Monday-Thursday from 8:00 AM to 4:00 PM) (Friday 8:00 AM to 12:00 Noon) or if you have a scheduled appointment with us, prior to your surgery, and ask for it by name. In addition, you are responsible for: calling our office [phone], at any time of the day or night, and leaving a message stating your name, name of your surgeon, type of surgery, and date of procedure or surgery. Failure to comply with your responsibilities may result in termination of therapy involving the controlled substances. Medication Agreement Violation. Following the above rules, including your responsibilities will help you in avoiding a Medication Agreement Violation ("Breaking your Pain Medication Contract").  Consequences:  Not following the above rules may result in permanent discontinuation of  medication prescription therapy.  *Opioid medications include: morphine, codeine, oxycodone, oxymorphone, hydrocodone, hydromorphone, meperidine, tramadol, tapentadol, buprenorphine, fentanyl, methadone. **Benzodiazepine medications include: diazepam (Valium), alprazolam (Xanax), clonazepam (Klonopine), lorazepam (Ativan), clorazepate (Tranxene), chlordiazepoxide (Librium), estazolam (Prosom), oxazepam (Serax), temazepam (Restoril), triazolam (Halcion) (Last updated: 10/10/2022) ______________________________________________________________________    ______________________________________________________________________  Medication Recommendations and Reminders  Applies to: All patients receiving prescriptions (written and/or electronic).  Medication Rules & Regulations: You are responsible for reading, knowing, and following our "Medication Rules" document. These exist for your safety and that of others. They are not flexible and neither are we. Dismissing or ignoring them is an   act of "non-compliance" that may result in complete and irreversible termination of such medication therapy. For safety reasons, "non-compliance" will not be tolerated. As with the [place] fundamental legal principle of "ignorance of the law is no defense", we will accept no excuses for not having read and knowing the content of documents provided to you by our practice.  Pharmacy of record:  Definition: This is the pharmacy where your electronic prescriptions will be sent.  We do not endorse any particular pharmacy. It is up to you and your insurance to decide what pharmacy to use.  We do not restrict you in your choice of pharmacy. However, once we write for your prescriptions, we will NOT be re-sending more prescriptions to fix restricted supply problems created by your pharmacy, or your insurance.  The pharmacy listed in the electronic medical record should be the one where you want electronic prescriptions to be  sent. If you choose to change pharmacy, simply notify our nursing staff. Changes will be made only during your regular appointments and not over the phone.  Recommendations: Keep all of your pain medications in a safe place, under lock and key, even if you live alone. We will NOT replace lost, stolen, or damaged medication. We do not accept "Police Reports" as proof of medications having been stolen. After you fill your prescription, take 1 week's worth of pills and put them away in a safe place. You should keep a separate, properly labeled bottle for this purpose. The remainder should be kept in the original bottle. Use this as your primary supply, until it runs out. Once it's gone, then you know that you have 1 week's worth of medicine, and it is time to come in for a prescription refill. If you do this correctly, it is unlikely that you will ever run out of medicine. To make sure that the above recommendation works, it is very important that you make sure your medication refill appointments are scheduled at least 1 week before you run out of medicine. To do this in an effective manner, make sure that you do not leave the office without scheduling your next medication management appointment. Always ask the nursing staff to show you in your prescription , when your medication will be running out. Then arrange for the receptionist to get you a return appointment, at least 7 days before you run out of medicine. Do not wait until you have 1 or 2 pills left, to come in. This is very poor planning and does not take into consideration that we may need to cancel appointments due to bad weather, sickness, or emergencies affecting our staff. DO NOT ACCEPT A "Partial Fill": If for any reason your pharmacy does not have enough pills/tablets to completely fill or refill your prescription, do not allow for a "partial fill". The law allows the pharmacy to complete that prescription within 72 hours, without requiring a new  prescription. If they do not fill the rest of your prescription within those 72 hours, you will need a separate prescription to fill the remaining amount, which we will NOT provide. If the reason for the partial fill is your insurance, you will need to talk to the pharmacist about payment alternatives for the remaining tablets, but again, DO NOT ACCEPT A PARTIAL FILL, unless you can trust your pharmacist to obtain the remainder of the pills within 72 hours.  Prescription refills and/or changes in medication(s):  Prescription refills, and/or changes in dose or medication, will be conducted only   during scheduled medication management appointments. (Applies to both, written and electronic prescriptions.) No refills on procedure days. No medication will be changed or started on procedure days. No changes, adjustments, and/or refills will be conducted on a procedure day. Doing so will interfere with the diagnostic portion of the procedure. No phone refills. No medications will be "called into the pharmacy". No Fax refills. No weekend refills. No Holliday refills. No after hours refills.  Remember:  Business hours are:  Monday to Thursday 8:00 AM to 4:00 PM Provider's Schedule: Lela Gell, MD - Appointments are:  Medication management: Monday and Wednesday 8:00 AM to 4:00 PM Procedure day: Tuesday and Thursday 7:30 AM to 4:00 PM Bilal Lateef, MD - Appointments are:  Medication management: Tuesday and Thursday 8:00 AM to 4:00 PM Procedure day: Monday and Wednesday 7:30 AM to 4:00 PM (Last update: 10/10/2022) ______________________________________________________________________    ____________________________________________________________________________________________  WARNING: CBD (cannabidiol) & Delta (Delta-8 tetrahydrocannabinol) products.   Applicable to:  All individuals currently taking or considering taking CBD (cannabidiol) and, more important, all patients taking opioid  analgesic controlled substances (pain medication). (Example: oxycodone; oxymorphone; hydrocodone; hydromorphone; morphine; methadone; tramadol; tapentadol; fentanyl; buprenorphine; butorphanol; dextromethorphan; meperidine; codeine; etc.)  Introduction:  Recently there has been a drive towards the use of "natural" products for the treatment of different conditions, including pain anxiety and sleep disorders. Marijuana and hemp are two varieties of the cannabis genus plants. Marijuana and its derivatives are illegal, while hemp and its derivatives are not. Cannabidiol (CBD) and tetrahydrocannabinol (THC), are two natural compounds found in plants of the Cannabis genus. They can both be extracted from hemp or marijuana. Both compounds interact with your body's endocannabinoid system in very different ways. CBD is associated with pain relief (analgesia) while THC is associated with the psychoactive effects ("the high") obtained from the use of marijuana products. There are two main types of THC: Delta-9, which comes from the marijuana plant and it is illegal, and Delta-8, which comes from the hemp plant, and it is legal. (Both, Delta-9-THC and Delta-8-THC are psychoactive and give you "the high".)   Legality:  Marijuana and its derivatives: illegal Hemp and its derivatives: Legal (State dependent) UPDATE: (02/03/2022) The Drug Enforcement Agency (DEA) issued a letter stating that "delta" cannabinoids, including Delta-8-THCO and Delta-9-THCO, synthetically derived from hemp do not qualify as hemp and will be viewed as Schedule I drugs. (Schedule I drugs, substances, or chemicals are defined as drugs with no currently accepted medical use and a high potential for abuse. Some examples of Schedule I drugs are: heroin, lysergic acid diethylamide (LSD), marijuana (cannabis), 3,4-methylenedioxymethamphetamine (ecstasy), methaqualone, and peyote.) (https://www.dea.gov)  Legal status of CBD in [place]:  "Conditionally  Legal"  Reference: "FDA Regulation of Cannabis and Cannabis-Derived Products, Including Cannabidiol (CBD)" - https://www.fda.gov/news-events/public-health-focus/fda-regulation-cannabis-and-cannabis-derived-products-including-cannabidiol-cbd  Warning:  CBD is not FDA approved and has not undergo the same manufacturing controls as prescription drugs.  This means that the purity and safety of available CBD may be questionable. Most of the time, despite manufacturer's claims, it is contaminated with THC (delta-9-tetrahydrocannabinol - the chemical in marijuana responsible for the "HIGH").  When this is the case, the THC contaminant will trigger a positive urine drug screen (UDS) test for Marijuana (carboxy-THC).   The FDA recently put out a warning about 5 things that everyone should be aware of regarding Delta-8 THC: Delta-8 THC products have not been evaluated or approved by the FDA for safe use and may be marketed in ways that put the public health at   risk. The FDA has received adverse event reports involving delta-8 THC-containing products. Delta-8 THC has psychoactive and intoxicating effects. Delta-8 THC manufacturing often involve use of potentially harmful chemicals to create the concentrations of delta-8 THC claimed in the marketplace. The final delta-8 THC product may have potentially harmful by-products (contaminants) due to the chemicals used in the process. Manufacturing of delta-8 THC products may occur in uncontrolled or unsanitary settings, which may lead to the presence of unsafe contaminants or other potentially harmful substances. Delta-8 THC products should be kept out of the reach of children and pets.  NOTE: Because a positive UDS for any illicit substance is a violation of our medication agreement, your opioid analgesics (pain medicine) may be permanently discontinued.  MORE ABOUT CBD  General Information: CBD was discovered in 1940 and it is a derivative of the cannabis sativa  genus plants (Marijuana and Hemp). It is one of the 113 identified substances found in Marijuana. It accounts for up to 40% of the plant's extract. As of 2018, preliminary clinical studies on CBD included research for the treatment of anxiety, movement disorders, and pain. CBD is available and consumed in multiple forms, including inhalation of smoke or vapor, as an aerosol spray, and by mouth. It may be supplied as an oil containing CBD, capsules, dried cannabis, or as a liquid solution. CBD is thought not to be as psychoactive as THC (delta-9-tetrahydrocannabinol - the chemical in marijuana responsible for the "HIGH"). Studies suggest that CBD may interact with different biological target receptors in the body, including cannabinoid and other neurotransmitter receptors. As of 2018 the mechanism of action for its biological effects has not been determined.  Side-effects  Adverse reactions: Dry mouth, diarrhea, decreased appetite, fatigue, drowsiness, malaise, weakness, sleep disturbances, and others.  Drug interactions:  CBD may interact with medications such as blood-thinners. CBD causes drowsiness on its own and it will increase drowsiness caused by other medications, including antihistamines (such as Benadryl), benzodiazepines (Xanax, Ativan, Valium), antipsychotics, antidepressants, opioids, alcohol and supplements such as kava, melatonin and St. John's Wort.  Other drug interactions: Brivaracetam (Briviact); Caffeine; Carbamazepine (Tegretol); Citalopram (Celexa); Clobazam (Onfi); Eslicarbazepine (Aptiom); Everolimus (Zostress); Lithium; Methadone (Dolophine); Rufinamide (Banzel); Sedative medications (CNS depressants); Sirolimus (Rapamune); Stiripentol (Diacomit); Tacrolimus (Prograf); Tamoxifen ; Soltamox); Topiramate (Topamax); Valproate; Warfarin (Coumadin); Zonisamide. (Last update: 11/27/2022) ____________________________________________________________________________________________    ____________________________________________________________________________________________  Naloxone Nasal Spray  Why am I receiving this medication? [place] STOP ACT requires that all patients taking high dose opioids or at risk of opioids respiratory depression, be prescribed an opioid reversal agent, such as Naloxone (AKA: Narcan).  What is this medication? NALOXONE (nal OX one) treats opioid overdose, which causes slow or shallow breathing, severe drowsiness, or trouble staying awake. Call emergency services after using this medication. You may need additional treatment. Naloxone works by reversing the effects of opioids. It belongs to a group of medications called opioid blockers.  COMMON BRAND NAME(S): Kloxxado, Narcan  What should I tell my care team before I take this medication? They need to know if you have any of these conditions: Heart disease Substance use disorder An unusual or allergic reaction to naloxone, other medications, foods, dyes, or preservatives Pregnant or trying to get pregnant Breast-feeding  When to use this medication? This medication is to be used for the treatment of respiratory depression (less than 8 breaths per minute) secondary to opioid overdose.   How to use this medication? This medication is for use in the nose. Lay the person on their   back. Support their neck with your hand and allow the head to tilt back before giving the medication. The nasal spray should be given into 1 nostril. After giving the medication, move the person onto their side. Do not remove or test the nasal spray until ready to use. Get emergency medical help right away after giving the first dose of this medication, even if the person wakes up. You should be familiar with how to recognize the signs and symptoms of a narcotic overdose. If more doses are needed, give the additional dose in the other nostril. Talk to your care team about the use of this medication in children.  While this medication may be prescribed for children as young as newborns for selected conditions, precautions do apply.  Naloxone Overdosage: If you think you have taken too much of this medicine contact a poison control center or emergency room at once.  NOTE: This medicine is only for you. Do not share this medicine with others.  What if I miss a dose? This does not apply.  What may interact with this medication? This is only used during an emergency. No interactions are expected during emergency use. This list may not describe all possible interactions. Give your health care provider a list of all the medicines, herbs, non-prescription drugs, or dietary supplements you use. Also tell them if you smoke, drink alcohol, or use illegal drugs. Some items may interact with your medicine.  What should I watch for while using this medication? Keep this medication ready for use in the case of an opioid overdose. Make sure that you have the phone number of your care team and local hospital ready. You may need to have additional doses of this medication. Each nasal spray contains a single dose. Some emergencies may require additional doses. After use, bring the treated person to the nearest hospital or call 911. Make sure the treating care team knows that the person has received a dose of this medication. You will receive additional instructions on what to do during and after use of this medication before an emergency occurs.  What side effects may I notice from receiving this medication? Side effects that you should report to your care team as soon as possible: Allergic reactions--skin rash, itching, hives, swelling of the face, lips, tongue, or throat Side effects that usually do not require medical attention (report these to your care team if they continue or are bothersome): Constipation Dryness or irritation inside the nose Headache Increase in blood pressure Muscle spasms Stuffy  nose Toothache This list may not describe all possible side effects. Call your doctor for medical advice about side effects. You may report side effects to FDA at 1-800-FDA-1088.  Where should I keep my medication? Because this is an emergency medication, you should keep it with you at all times.  Keep out of the reach of children and pets. Store between 20 and 25 degrees C (68 and 77 degrees F). Do not freeze. Throw away any unused medication after the expiration date. Keep in original box until ready to use.  NOTE: This sheet is a summary. It may not cover all possible information. If you have questions about this medicine, talk to your doctor, pharmacist, or health care provider.   2023 Elsevier/Gold Standard (2021-08-12 00:00:00)  ____________________________________________________________________________________________   

## 2022-11-30 ENCOUNTER — Encounter: Payer: Self-pay | Admitting: Pain Medicine

## 2022-11-30 ENCOUNTER — Ambulatory Visit
Admission: RE | Admit: 2022-11-30 | Discharge: 2022-11-30 | Disposition: A | Discharge status: Home / Self Care | Payer: Medicare Other | Source: Ambulatory Visit | Attending: Pain Medicine | Admitting: Pain Medicine

## 2022-11-30 ENCOUNTER — Ambulatory Visit: Payer: Medicare Other | Attending: Pain Medicine | Admitting: Pain Medicine

## 2022-11-30 VITALS — BP 152/80 | HR 99 | Temp 97.2°F | Resp 15 | Ht 64.0 in | Wt 183.0 lb

## 2022-11-30 DIAGNOSIS — Z79891 Long term (current) use of opiate analgesic: Secondary | ICD-10-CM | POA: Diagnosis not present

## 2022-11-30 DIAGNOSIS — M5137 Other intervertebral disc degeneration, lumbosacral region: Secondary | ICD-10-CM | POA: Diagnosis not present

## 2022-11-30 DIAGNOSIS — M545 Low back pain, unspecified: Secondary | ICD-10-CM | POA: Insufficient documentation

## 2022-11-30 DIAGNOSIS — M25511 Pain in right shoulder: Secondary | ICD-10-CM | POA: Diagnosis not present

## 2022-11-30 DIAGNOSIS — S46001S Unspecified injury of muscle(s) and tendon(s) of the rotator cuff of right shoulder, sequela: Secondary | ICD-10-CM | POA: Diagnosis not present

## 2022-11-30 DIAGNOSIS — G8929 Other chronic pain: Secondary | ICD-10-CM | POA: Diagnosis not present

## 2022-11-30 DIAGNOSIS — G894 Chronic pain syndrome: Secondary | ICD-10-CM | POA: Diagnosis not present

## 2022-11-30 DIAGNOSIS — M25561 Pain in right knee: Secondary | ICD-10-CM | POA: Insufficient documentation

## 2022-11-30 DIAGNOSIS — M47817 Spondylosis without myelopathy or radiculopathy, lumbosacral region: Secondary | ICD-10-CM

## 2022-11-30 DIAGNOSIS — Z79899 Other long term (current) drug therapy: Secondary | ICD-10-CM | POA: Diagnosis not present

## 2022-11-30 DIAGNOSIS — M51379 Other intervertebral disc degeneration, lumbosacral region without mention of lumbar back pain or lower extremity pain: Secondary | ICD-10-CM

## 2022-11-30 DIAGNOSIS — M79604 Pain in right leg: Secondary | ICD-10-CM | POA: Insufficient documentation

## 2022-11-30 DIAGNOSIS — M47816 Spondylosis without myelopathy or radiculopathy, lumbar region: Secondary | ICD-10-CM | POA: Diagnosis not present

## 2022-11-30 MED ORDER — HYDROCODONE-ACETAMINOPHEN 5-325 MG PO TABS: 1.0000 | ORAL_TABLET | Freq: Three times a day (TID) | ORAL | 0 refills | Status: DC

## 2022-11-30 MED ORDER — PENTAFLUOROPROP-TETRAFLUOROETH EX AERO
INHALATION_SPRAY | Freq: Once | CUTANEOUS | Status: AC
  Administered 2022-11-30: 30 via TOPICAL

## 2022-11-30 MED ORDER — TRIAMCINOLONE ACETONIDE 40 MG/ML IJ SUSP
40.0000 mg | Freq: Once | INTRAMUSCULAR | Status: AC
  Administered 2022-11-30: 40 mg
  Filled 2022-11-30: qty 1

## 2022-11-30 MED ORDER — NALOXONE HCL 4 MG/0.1ML NA LIQD: 1.0000 | NASAL | 0 refills | Status: DC | PRN

## 2022-11-30 MED ORDER — LIDOCAINE HCL 2 % IJ SOLN
20.0000 mL | Freq: Once | INTRAMUSCULAR | Status: AC
  Administered 2022-11-30: 400 mg
  Filled 2022-11-30: qty 40

## 2022-11-30 MED ORDER — ROPIVACAINE HCL 2 MG/ML IJ SOLN
9.0000 mL | Freq: Once | INTRAMUSCULAR | Status: AC
  Administered 2022-11-30: 9 mL via PERINEURAL
  Filled 2022-11-30: qty 20

## 2022-11-30 MED ORDER — LACTATED RINGERS IV SOLN: Freq: Once | INTRAVENOUS | Status: AC

## 2022-11-30 MED ORDER — MIDAZOLAM HCL 2 MG/2ML IJ SOLN
0.5000 mg | Freq: Once | INTRAMUSCULAR | Status: AC
  Administered 2022-11-30: 1.5 mg via INTRAVENOUS
  Filled 2022-11-30: qty 2

## 2022-11-30 NOTE — Patient Instructions (Signed)
___________________________________________________________________________________________  Post-Radiofrequency (RF) Discharge Instructions  You have just completed a Radiofrequency Neurotomy.  The following instructions will provide you with information and guidelines for self-care upon discharge.  If at any time you have questions or concerns please call your physician. DO NOT DRIVE YOURSELF!!  Instructions:  Apply ice: Fill a plastic sandwich bag with crushed ice. Cover it with a small towel and apply to injection site. Apply for 15 minutes then remove x 15 minutes. Repeat sequence on day of procedure, until you go to bed. The purpose is to minimize swelling and discomfort after procedure.  Apply heat: Apply heat to procedure site starting the day following the procedure. The purpose is to treat any soreness and discomfort from the procedure.  Food intake: No eating limitations, unless stipulated above.  Nevertheless, if you have had sedation, you may experience some nausea.  In this case, it may be wise to wait at least two hours prior to resuming regular diet.  Physical activities: Keep activities to a minimum for the first 8 hours after the procedure. For the first 24 hours after the procedure, do not drive a motor vehicle,  Operate heavy machinery, power tools, or handle any weapons.  Consider walking with the use of an assistive device or accompanied by an adult for the first 24 hours.  Do not drink alcoholic beverages including beer.  Do not make any important decisions or sign any legal documents. Go home and rest today.  Resume activities tomorrow, as tolerated.  Use caution in moving about as you may experience mild leg weakness.  Use caution in cooking, use of household electrical appliances and climbing steps.  Driving: If you have received any sedation, you are not allowed to drive for 24 hours after your procedure.  Blood thinner: Restart your blood thinner 6 hours after your  procedure. (Only for those taking blood thinners)  Insulin: As soon as you can eat, you may resume your normal dosing schedule. (Only for those taking insulin)  Medications: May resume pre-procedure medications.  Do not take any drugs, other than what has been prescribed to you.  Infection prevention: Keep procedure site clean and dry.  Post-procedure Pain Diary: Extremely important that this be done correctly and accurately. Recorded information will be used to determine the next step in treatment.  Pain evaluated is that of treated area only. Do not include pain from an untreated area.  Complete every hour, on the hour, for the initial 8 hours. Set an alarm to help you do this part accurately.  Do not go to sleep and have it completed later. It will not be accurate.  Follow-up appointment: Keep your follow-up appointment after the procedure. Usually 2-6 weeks after radiofrequency. Bring you pain diary. The information collected will be essential for your long-term care.   Expect:  From numbing medicine (AKA: Local Anesthetics): Numbness or decrease in pain.  Onset: Full effect within 15 minutes of injected.  Duration: It will depend on the type of local anesthetic used. On the average, 1 to 8 hours.   From steroids (when added): Decrease in swelling or inflammation. Once inflammation is improved, relief of the pain will follow.  Onset of benefits: Depends on the amount of swelling present. The more swelling, the longer it will take for the benefits to be seen. In some cases, up to 10 days.  Duration: Steroids will stay in the system x 2 weeks. Duration of benefits will depend on multiple posibilities including persistent irritating factors.    this may last as long as 6 weeks. Additional post-procedure pain medication is provided for this. Discomfort is minimized if ice and heat are applied as  instructed.  Call if: You experience numbness and weakness that gets worse with time, as opposed to wearing off. He experience any unusual bleeding, difficulty breathing, or loss of the ability to control your bowel and bladder. (This applies to Spinal procedures only) You experience any redness, swelling, heat, red streaks, elevated temperature, fever, or any other signs of a possible infection.  Emergency Numbers: [place] hours (Monday - Thursday, 8:00 AM - 4:00 PM) (Friday, 9:00 AM - 12:00 Noon): [phone] After hours: [phone] ____________________________________________________________________________________________    ____________________________________________________________________________________________  National Pain Medication Shortage  The [place] is experiencing worsening drug shortages. These have had a negative widespread effect on patient care and treatment. Not expected to improve any time soon. Predicted to last past 2029.   Drug shortage list (generic names) Oxycodone IR Oxycodone/APAP Oxymorphone IR Hydromorphone Hydrocodone/APAP Morphine  Where is the problem?  Manufacturing and supply level.  Will this shortage affect you?  Only if you take any of the above pain medications.  How? You may be unable to fill your prescription.  Your pharmacist may offer a "partial fill" of your prescription. (Warning: Do not accept partial fills.) Prescriptions partially filled cannot be transferred to another pharmacy. Read our Medication Rules and Regulation. Depending on how much medicine you are dependent on, you may experience withdrawals when unable to get the medication.  Recommendations: Consider ending your dependence on opioid pain medications. Ask your pain specialist to assist you with the process. Consider switching to a medication currently not in shortage, such as Buprenorphine. Talk to your pain specialist about this option. Consider  decreasing your pain medication requirements by managing tolerance thru "Drug Holidays". This may help minimize withdrawals, should you run out of medicine. Control your pain thru the use of non-pharmacological interventional therapies.   Your prescriber: Prescribers cannot be blamed for shortages. Medication manufacturing and supply issues cannot be fixed by the prescriber.   NOTE: The prescriber is not responsible for supplying the medication, or solving supply issues. Work with your pharmacist to solve it. The patient is responsible for the decision to take or continue taking the medication and for identifying and securing a legal supply source. By law, supplying the medication is the job and responsibility of the pharmacy. The prescriber is responsible for the evaluation, monitoring, and prescribing of these medications.   Prescribers will NOT: Re-issue prescriptions that have been partially filled. Re-issue prescriptions already sent to a pharmacy.  Re-send prescriptions to a different pharmacy because yours did not have your medication. Ask pharmacist to order more medicine or transfer the prescription to another pharmacy. (Read below.)  New 2023 regulation: "August 18, 2022 Revised Regulation Allows DEA-Registered Pharmacies to Transfer Electronic Prescriptions at a Patient's Request [place] Patients now have the ability to request their electronic prescription be transferred to another pharmacy without having to go back to their practitioner to initiate the request. This revised regulation went into effect on Monday, August 14, 2022.     At a patient's request, a DEA-registered retail pharmacy can now transfer an electronic prescription for a controlled substance (schedules II-V) to another DEA-registered retail pharmacy. Prior to this change, patients would have to go through their practitioner to cancel their prescription and have it  re-issued to a different pharmacy. The process was taxing and time  consuming for both patients and practitioners.    The Drug Enforcement Administration [facility]) published its intent to revise the process for transferring electronic prescriptions on November 05, 2020.  The final rule was published in the federal register on July 13, 2022 and went into effect 30 days later.  Under the final rule, a prescription can only be transferred once between pharmacies, and only if allowed under existing state or other applicable law. The prescription must remain in its electronic form; may not be altered in any way; and the transfer must be communicated directly between two licensed pharmacists. It's important to note, any authorized refills transfer with the original prescription, which means the entire prescription will be filled at the same pharmacy".  Reference: [email or web address] [facility] website announcement)  [email or web address] ([place])   [employer] / Vol. 88, No. 143 / Thursday, July 13, 2022 / Rules and Regulations DEPARTMENT OF JUSTICE  Drug Enforcement Administration  21 CFR Part 1306  [Docket No. [id number]]  RIN [id number] Transfer of Electronic Prescriptions for Schedules II-V Controlled Substances Between Pharmacies for Initial Filling  ____________________________________________________________________________________________     ____________________________________________________________________________________________  Patient Information update  To: All of our patients.  Re: Name change.  It has been made official that our current name, "[facility]"   will soon be changed to "[facility]".   The  purpose of this change is to eliminate any confusion created by the concept of our practice being a "Medication Management Pain Clinic". In the past this has led to the misconception that we treat pain primarily by the use of prescription medications.  Nothing can be farther from the truth.   Understanding PAIN MANAGEMENT: To further understand what our practice does, you first have to understand that "Pain Management" is a subspecialty that requires additional training once a physician has completed their specialty training, which can be in either Anesthesia, Neurology, Psychiatry, or Physical Medicine and Rehabilitation (PMR). Each one of these contributes to the final approach taken by each physician to the management of their patient's pain. To be a "Pain Management Specialist" you must have first completed one of the specialty trainings below.  Anesthesiologists - trained in clinical pharmacology and interventional techniques such as nerve blockade and regional as well as central neuroanatomy. They are trained to block pain before, during, and after surgical interventions.  Neurologists - trained in the diagnosis and pharmacological treatment of complex neurological conditions, such as Multiple Sclerosis, Parkinson's, spinal cord injuries, and other systemic conditions that may be associated with symptoms that may include but are not limited to pain. They tend to rely primarily on the treatment of chronic pain using prescription medications.  Psychiatrist - trained in conditions affecting the psychosocial wellbeing of patients including but not limited to depression, anxiety, schizophrenia, personality disorders, addiction, and other substance use disorders that may be associated with chronic pain. They tend to rely primarily on the treatment of chronic pain using prescription medications.   Physical Medicine and Rehabilitation (PMR) physicians, also known as physiatrists - trained to treat a wide  variety of medical conditions affecting the brain, spinal cord, nerves, bones, joints, ligaments, muscles, and tendons. Their training is primarily aimed at treating patients that have suffered injuries that have caused severe physical impairment. Their training is primarily aimed at the physical therapy and rehabilitation of those patients. They may also work alongside orthopedic surgeons or neurosurgeons using their expertise  in assisting surgical patients to recover after their surgeries.  INTERVENTIONAL PAIN MANAGEMENT is sub-subspecialty of Pain Management.  Our physicians are Board-certified in Anesthesia, Pain Management, and Interventional Pain Management.  This meaning that not only have they been trained and Board-certified in their specialty of Anesthesia, and subspecialty of Pain Management, but they have also received further training in the sub-subspecialty of Interventional Pain Management, in order to become Board-certified as INTERVENTIONAL PAIN MANAGEMENT SPECIALIST.    Mission: Our goal is to use our skills in  [place] as alternatives to the chronic use of prescription opioid medications for the treatment of pain. To make this more clear, we have changed our name to reflect what we do and offer. We will continue to offer medication management assessment and recommendations, but we will not be taking over any patient's medication management.  ____________________________________________________________________________________________     _______________________________________________________________________  Medication Rules  Purpose: To inform patients, and their family members, of our medication rules and regulations.  Applies to: All patients receiving prescriptions from our practice (written or electronic).  Pharmacy of record: This is the pharmacy where your electronic prescriptions will be sent. Make sure we have the correct one.  Electronic  prescriptions: In compliance with the [place] (STOP) Act of 2017 (Session Lanny Cramp [phone]), effective December 18, 2018, all controlled substances must be electronically prescribed. Written prescriptions, faxing, or calling prescriptions to a pharmacy will no longer be done.  Prescription refills: These will be provided only during in-person appointments. No medications will be renewed without a "face-to-face" evaluation with your provider. Applies to all prescriptions.  NOTE: The following applies primarily to controlled substances (Opioid* Pain Medications).   Type of encounter (visit): For patients receiving controlled substances, face-to-face visits are required. (Not an option and not up to the patient.)  Patient's responsibilities: Pain Pills: Bring all pain pills to every appointment (except for procedure appointments). Pill Bottles: Bring pills in original pharmacy bottle. Bring bottle, even if empty. Always bring the bottle of the most recent fill.  Medication refills: You are responsible for knowing and keeping track of what medications you are taking and when is it that you will need a refill. The day before your appointment: write a list of all prescriptions that need to be refilled. The day of the appointment: give the list to the admitting nurse. Prescriptions will be written only during appointments. No prescriptions will be written on procedure days. If you forget a medication: it will not be "Called in", "Faxed", or "electronically sent". You will need to get another appointment to get these prescribed. No early refills. Do not call asking to have your prescription filled early. Partial  or short prescriptions: Occasionally your pharmacy may not have enough pills to fill your prescription.  NEVER ACCEPT a partial fill or a prescription that is short of the total amount of pills that you were prescribed.  With controlled substances the law  allows 72 hours for the pharmacy to complete the prescription.  If the prescription is not completed within 72 hours, the pharmacist will require a new prescription to be written. This means that you will be short on your medicine and we WILL NOT send another prescription to complete your original prescription.  Instead, request the pharmacy to send a carrier to a nearby branch to get enough medication to provide you with your full prescription. Prescription Accuracy: You are responsible for carefully inspecting your prescriptions before leaving our office. Have the  discharge nurse carefully go over each prescription with you, before taking them home. Make sure that your name is accurately spelled, that your address is correct. Check the name and dose of your medication to make sure it is accurate. Check the number of pills, and the written instructions to make sure they are clear and accurate. Make sure that you are given enough medication to last until your next medication refill appointment. Taking Medication: Take medication as prescribed. When it comes to controlled substances, taking less pills or less frequently than prescribed is permitted and encouraged. Never take more pills than instructed. Never take the medication more frequently than prescribed.  Inform other Doctors: Always inform, all of your healthcare providers, of all the medications you take. Pain Medication from other Providers: You are not allowed to accept any additional pain medication from any other Doctor or Healthcare provider. There are two exceptions to this rule. (see below) In the event that you require additional pain medication, you are responsible for notifying us, as stated below. Cough Medicine: Often these contain an opioid, such as codeine or hydrocodone. Never accept or take cough medicine containing these opioids if you are already taking an opioid* medication. The combination may cause respiratory failure and  death. Medication Agreement: You are responsible for carefully reading and following our Medication Agreement. This must be signed before receiving any prescriptions from our practice. Safely store a copy of your signed Agreement. Violations to the Agreement will result in no further prescriptions. (Additional copies of our Medication Agreement are available upon request.) Laws, Rules, & Regulations: All patients are expected to follow all Federal and [employer], Rules, [employer]. Ignorance of the Laws does not constitute a valid excuse.  Illegal drugs and Controlled Substances: The use of illegal substances (including, but not limited to marijuana and its derivatives) and/or the illegal use of any controlled substances is strictly prohibited. Violation of this rule may result in the immediate and permanent discontinuation of any and all prescriptions being written by our practice. The use of any illegal substances is prohibited. Adopted CDC guidelines & recommendations: Target dosing levels will be at or below 60 MME/day. Use of benzodiazepines** is not recommended.  Exceptions: There are only two exceptions to the rule of not receiving pain medications from other Healthcare Providers. Exception #1 (Emergencies): In the event of an emergency (i.e.: accident requiring emergency care), you are allowed to receive additional pain medication. However, you are responsible for: As soon as you are able, call our office [phone], at any time of the day or night, and leave a message stating your name, the date and nature of the emergency, and the name and dose of the medication prescribed. In the event that your call is answered by a member of our staff, make sure to document and save the date, time, and the name of the person that took your information.  Exception #2 (Planned Surgery): In the event that you are scheduled by another doctor or dentist to have any type of surgery or procedure, you  are allowed (for a period no longer than 30 days), to receive additional pain medication, for the acute post-op pain. However, in this case, you are responsible for picking up a copy of our "Post-op Pain Management for Surgeons" handout, and giving it to your surgeon or dentist. This document is available at our office, and does not require an appointment to obtain it. Simply go to our office during business hours (Monday-Thursday from  8:00 AM to 4:00 PM) (Friday 8:00 AM to 12:00 Noon) or if you have a scheduled appointment with [place], prior to your surgery, and ask for it by name. In addition, you are responsible for: calling our office [phone], at any time of the day or night, and leaving a message stating your name, name of your surgeon, type of surgery, and date of procedure or surgery. Failure to comply with your responsibilities may result in termination of therapy involving the controlled substances. Medication Agreement Violation. Following the above rules, including your responsibilities will help you in avoiding a Medication Agreement Violation ("Breaking your Pain Medication Contract").  Consequences:  Not following the above rules may result in permanent discontinuation of medication prescription therapy.  *Opioid medications include: morphine, codeine, oxycodone, oxymorphone, hydrocodone, hydromorphone, meperidine, tramadol, tapentadol, buprenorphine, fentanyl, methadone. **Benzodiazepine medications include: diazepam (Valium), alprazolam (Xanax), clonazepam (Klonopine), lorazepam (Ativan), clorazepate (Tranxene), chlordiazepoxide (Librium), estazolam (Prosom), oxazepam (Serax), temazepam (Restoril), triazolam (Halcion) (Last updated: 10/10/2022) ______________________________________________________________________    ______________________________________________________________________  Medication Recommendations and Reminders  Applies to: All patients receiving prescriptions  (written and/or electronic).  Medication Rules & Regulations: You are responsible for reading, knowing, and following our "Medication Rules" document. These exist for your safety and that of others. They are not flexible and neither are we. Dismissing or ignoring them is an act of "non-compliance" that may result in complete and irreversible termination of such medication therapy. For safety reasons, "non-compliance" will not be tolerated. As with the U[place] fundamental legal principle of "ignorance of the law is no defense", we will accept no excuses for not having read and knowing the content of documents provided to you by our practice.  Pharmacy of record:  Definition: This is the pharmacy where your electronic prescriptions will be sent.  We do not endorse any particular pharmacy. It is up to you and your insurance to decide what pharmacy to use.  We do not restrict you in your choice of pharmacy. However, once we write for your prescriptions, we will NOT be re-sending more prescriptions to fix restricted supply problems created by your pharmacy, or your insurance.  The pharmacy listed in the electronic medical record should be the one where you want electronic prescriptions to be sent. If you choose to change pharmacy, simply notify our nursing staff. Changes will be made only during your regular appointments and not over the phone.  Recommendations: Keep all of your pain medications in a safe place, under lock and key, even if you live alone. We will NOT replace lost, stolen, or damaged medication. We do not accept "Police Reports" as proof of medications having been stolen. After you fill your prescription, take 1 week's worth of pills and put them away in a safe place. You should keep a separate, properly labeled bottle for this purpose. The remainder should be kept in the original bottle. Use this as your primary supply, until it runs out. Once it's gone, then you know that you have 1 week's  worth of medicine, and it is time to come in for a prescription refill. If you do this correctly, it is unlikely that you will ever run out of medicine. To make sure that the above recommendation works, it is very important that you make sure your medication refill appointments are scheduled at least 1 week before you run out of medicine. To do this in an effective manner, make sure that you do not leave the office without scheduling your next medication management appointment. Always  ask the nursing staff to show you in your prescription , when your medication will be running out. Then arrange for the receptionist to get you a return appointment, at least 7 days before you run out of medicine. Do not wait until you have 1 or 2 pills left, to come in. This is very poor planning and does not take into consideration that we may need to cancel appointments due to bad weather, sickness, or emergencies affecting our staff. DO NOT ACCEPT A "Partial Fill": If for any reason your pharmacy does not have enough pills/tablets to completely fill or refill your prescription, do not allow for a "partial fill". The law allows the pharmacy to complete that prescription within 72 hours, without requiring a new prescription. If they do not fill the rest of your prescription within those 72 hours, you will need a separate prescription to fill the remaining amount, which we will NOT provide. If the reason for the partial fill is your insurance, you will need to talk to the pharmacist about payment alternatives for the remaining tablets, but again, DO NOT ACCEPT A PARTIAL FILL, unless you can trust your pharmacist to obtain the remainder of the pills within 72 hours.  Prescription refills and/or changes in medication(s):  Prescription refills, and/or changes in dose or medication, will be conducted only during scheduled medication management appointments. (Applies to both, written and electronic prescriptions.) No refills on  procedure days. No medication will be changed or started on procedure days. No changes, adjustments, and/or refills will be conducted on a procedure day. Doing so will interfere with the diagnostic portion of the procedure. No phone refills. No medications will be "called into the pharmacy". No Fax refills. No weekend refills. No Holliday refills. No after hours refills.  Remember:  Business hours are:  Monday to Thursday 8:00 AM to 4:00 PM Provider's Schedule: Milinda Pointer, MD - Appointments are:  Medication management: Monday and Wednesday 8:00 AM to 4:00 PM Procedure day: Tuesday and Thursday 7:30 AM to 4:00 PM Gillis Santa, MD - Appointments are:  Medication management: Tuesday and Thursday 8:00 AM to 4:00 PM Procedure day: Monday and Wednesday 7:30 AM to 4:00 PM (Last update: 10/10/2022) ______________________________________________________________________    ____________________________________________________________________________________________  Drug Holidays  What is a "Drug Holiday"? Drug Holiday: is the name given to the process of slowly tapering down and temporarily stopping the pain medication for the purpose of decreasing or eliminating tolerance to the drug.  Benefits Improved effectiveness Decreased required effective dose Improved pain control End dependence on high dose therapy Decrease cost of therapy Uncovering "opioid-induced hyperalgesia". (OIH)  What is "opioid hyperalgesia"? It is a paradoxical increase in pain caused by exposure to opioids. Stopping the opioid pain medication, contrary to the expected, it actually decreases or completely eliminates the pain. Ref.: "A comprehensive review of opioid-induced hyperalgesia". Brion Aliment, et.al. Pain Physician. 2011 Mar-Apr;14(2):145-61.  What is tolerance? Tolerance: the progressive loss of effectiveness of a pain medicine due to repetitive use. A common problem of opioid pain medications.  How  long should a "Drug Holiday" last? Effectiveness depends on the patient staying off all opioid pain medicines for a minimum of 14 consecutive days. (2 weeks)  How about just taking less of the medicine? Does not work. Will not accomplish goal of eliminating the excess receptors.  How about switching to a different pain medicine? (AKA. "Opioid rotation") Does not work. Creates the illusion of effectiveness by taking advantage of inaccurate equivalent dose calculations between different opioids. -  This "technique" was promoted by studies funded by [employer], such as [employer], creators of "OxyContin".  Can I stop the medicine "cold [place]"? Depends. You should always coordinate with your Pain Specialist to make the transition as smoothly as possible. Avoid stopping the medicine abruptly without consulting. We recommend a "slow taper".  What is a slow taper? Taper: refers to the gradual decrease in dose.   How do I stop/taper the dose? Slowly. Decrease the daily amount of pills that you take by one (1) pill every seven (7) days. This is called a "slow downward taper". Example: if you normally take four (4) pills per day, drop it to three (3) pills per day for seven (7) days, then to two (2) pills per day for seven (7) days, then to one (1) per day for seven (7) days, and then stop the medicine. The 14 day "Drug Holiday" starts on the first day without medicine.   Will I experience withdrawals? Unlikely with a slow taper.  What triggers withdrawals? Withdrawals are triggered by the sudden/abrupt stop of high dose opioids. Withdrawals can be avoided by slowly decreasing the dose over a prolonged period of time.  What are withdrawals? Symptoms associated with sudden/abrupt reduction/stopping of high-dose, long-term use of pain medication. Withdrawal are seldom seen on low dose therapy, or patients rarely taking opioid medication.  Early Withdrawal Symptoms may  include: Agitation Anxiety Muscle aches Increased tearing Insomnia Runny nose Sweating Yawning  Late symptoms may include: Abdominal cramping Diarrhea Dilated pupils Goose bumps Nausea Vomiting  (Last update: 11/26/2022) ____________________________________________________________________________________________    ____________________________________________________________________________________________  WARNING: CBD (cannabidiol) & Delta (Delta-8 tetrahydrocannabinol) products.   Applicable to:  All individuals currently taking or considering taking CBD (cannabidiol) and, more important, all patients taking opioid analgesic controlled substances (pain medication). (Example: oxycodone; oxymorphone; hydrocodone; hydromorphone; morphine; methadone; tramadol; tapentadol; fentanyl; buprenorphine; butorphanol; dextromethorphan; meperidine; codeine; etc.)  Introduction:  Recently there has been a drive towards the use of "natural" products for the treatment of different conditions, including pain anxiety and sleep disorders. Marijuana and hemp are two varieties of the cannabis genus plants. Marijuana and its derivatives are illegal, while hemp and its derivatives are not. Cannabidiol (CBD) and tetrahydrocannabinol (THC), are two natural compounds found in plants of the Cannabis genus. They can both be extracted from hemp or marijuana. Both compounds interact with your body's endocannabinoid system in very different ways. CBD is associated with pain relief (analgesia) while THC is associated with the psychoactive effects ("the high") obtained from the use of marijuana products. There are two main types of THC: Delta-9, which comes from the marijuana plant and it is illegal, and Delta-8, which comes from the hemp plant, and it is legal. (Both, Delta-9-THC and Delta-8-THC are psychoactive and give you "the high".)   Legality:  Marijuana and its derivatives: illegal Hemp and its  derivatives: Legal (State dependent) UPDATE: (02/03/2022) The Drug Enforcement Agency ([place]) issued a letter stating that "delta" cannabinoids, including Delta-8-THCO and Delta-9-THCO, synthetically derived from hemp do not qualify as hemp and will be viewed as Schedule I drugs. (Schedule I drugs, substances, or chemicals are defined as drugs with no currently accepted medical use and a high potential for abuse. Some examples of Schedule I drugs are: heroin, lysergic acid diethylamide (LSD), marijuana (cannabis), 3,4-methylenedioxymethamphetamine (ecstasy), methaqualone, and peyote.) (https://jennings.com/)  Legal status of CBD in [place]:  "Conditionally Legal"  Reference: "FDA Regulation of Cannabis and Cannabis-Derived Products, Including Cannabidiol (CBD)" - SeekArtists.com.pt  Warning:  CBD is  not FDA approved and has not undergo the same manufacturing controls as prescription drugs.  This means that the purity and safety of available CBD may be questionable. Most of the time, despite manufacturer's claims, it is contaminated with THC (delta-9-tetrahydrocannabinol - the chemical in marijuana responsible for the "HIGH").  When this is the case, the [facility] contaminant will trigger a positive urine drug screen (UDS) test for Marijuana (carboxy-THC).   The FDA recently put out a warning about 5 things that everyone should be aware of regarding Delta-8 THC: Delta-8 THC products have not been evaluated or approved by the FDA for safe use and may be marketed in ways that put the public health at risk. The FDA has received adverse event reports involving delta-8 THC-containing products. Delta-8 THC has psychoactive and intoxicating effects. Delta-8 THC manufacturing often involve use of potentially harmful chemicals to create the concentrations of delta-8 THC claimed in the marketplace. The final delta-8  THC product may have potentially harmful by-products (contaminants) due to the chemicals used in the process. Manufacturing of delta-8 THC products may occur in uncontrolled or unsanitary settings, which may lead to the presence of unsafe contaminants or other potentially harmful substances. Delta-8 THC products should be kept out of the reach of children and pets.  NOTE: Because a positive UDS for any illicit substance is a violation of our medication agreement, your opioid analgesics (pain medicine) may be permanently discontinued.  MORE ABOUT CBD  General Information: CBD was discovered in 11 and it is a derivative of the cannabis sativa genus plants (Marijuana and Hemp). It is one of the 113 identified substances found in Marijuana. It accounts for up to 40% of the plant's extract. As of 2018, preliminary clinical studies on CBD included research for the treatment of anxiety, movement disorders, and pain. CBD is available and consumed in multiple forms, including inhalation of smoke or vapor, as an aerosol spray, and by mouth. It may be supplied as an oil containing CBD, capsules, dried cannabis, or as a liquid solution. CBD is thought not to be as psychoactive as THC (delta-9-tetrahydrocannabinol - the chemical in marijuana responsible for the "HIGH"). Studies suggest that CBD may interact with different biological target receptors in the body, including cannabinoid and other neurotransmitter receptors. As of 2018 the mechanism of action for its biological effects has not been determined.  Side-effects  Adverse reactions: Dry mouth, diarrhea, decreased appetite, fatigue, drowsiness, malaise, weakness, sleep disturbances, and others.  Drug interactions:  CBD may interact with medications such as blood-thinners. CBD causes drowsiness on its own and it will increase drowsiness caused by other medications, including antihistamines (such as Benadryl), benzodiazepines (Xanax, Ativan, Valium),  antipsychotics, antidepressants, opioids, alcohol and supplements such as kava, melatonin and St. John's Wort.  Other drug interactions: Brivaracetam (Briviact); Caffeine; Carbamazepine (Tegretol); Citalopram (Celexa); Clobazam (Onfi); Eslicarbazepine (Aptiom); Everolimus (Zostress); Lithium; Methadone (Dolophine); Rufinamide (Banzel); Sedative medications (CNS depressants); Sirolimus (Rapamune); Stiripentol (Diacomit); Tacrolimus (Prograf); Tamoxifen ; Soltamox); Topiramate (Topamax); Valproate; Warfarin (Coumadin); Zonisamide. (Last update: 11/27/2022) ____________________________________________________________________________________________   ____________________________________________________________________________________________  Naloxone Nasal Spray  Why am I receiving this medication? [place] STOP ACT requires that all patients taking high dose opioids or at risk of opioids respiratory depression, be prescribed an opioid reversal agent, such as Naloxone (AKA: Narcan).  What is this medication? NALOXONE (nal OX one) treats opioid overdose, which causes slow or shallow breathing, severe drowsiness, or trouble staying awake. Call emergency services after using this medication. You may need additional treatment. Naloxone works  by reversing the effects of opioids. It belongs to a group of medications called opioid blockers.  COMMON BRAND NAME(S): Kloxxado, Narcan  What should I tell my care team before I take this medication? They need to know if you have any of these conditions: Heart disease Substance use disorder An unusual or allergic reaction to naloxone, other medications, foods, dyes, or preservatives Pregnant or trying to get pregnant Breast-feeding  When to use this medication? This medication is to be used for the treatment of respiratory depression (less than 8 breaths per minute) secondary to opioid overdose.   How to use this medication? This medication is for  use in the nose. Lay the person on their back. Support their neck with your hand and allow the head to tilt back before giving the medication. The nasal spray should be given into 1 nostril. After giving the medication, move the person onto their side. Do not remove or test the nasal spray until ready to use. Get emergency medical help right away after giving the first dose of this medication, even if the person wakes up. You should be familiar with how to recognize the signs and symptoms of a narcotic overdose. If more doses are needed, give the additional dose in the other nostril. Talk to your care team about the use of this medication in children. While this medication may be prescribed for children as young as newborns for selected conditions, precautions do apply.  Naloxone Overdosage: If you think you have taken too much of this medicine contact a poison control center or emergency room at once.  NOTE: This medicine is only for you. Do not share this medicine with others.  What if I miss a dose? This does not apply.  What may interact with this medication? This is only used during an emergency. No interactions are expected during emergency use. This list may not describe all possible interactions. Give your health care provider a list of all the medicines, herbs, non-prescription drugs, or dietary supplements you use. Also tell them if you smoke, drink alcohol, or use illegal drugs. Some items may interact with your medicine.  What should I watch for while using this medication? Keep this medication ready for use in the case of an opioid overdose. Make sure that you have the phone number of your care team and local hospital ready. You may need to have additional doses of this medication. Each nasal spray contains a single dose. Some emergencies may require additional doses. After use, bring the treated person to the nearest hospital or call 911. Make sure the treating care team knows that the  person has received a dose of this medication. You will receive additional instructions on what to do during and after use of this medication before an emergency occurs.  What side effects may I notice from receiving this medication? Side effects that you should report to your care team as soon as possible: Allergic reactions--skin rash, itching, hives, swelling of the face, lips, tongue, or throat Side effects that usually do not require medical attention (report these to your care team if they continue or are bothersome): Constipation Dryness or irritation inside the nose Headache Increase in blood pressure Muscle spasms Stuffy nose Toothache This list may not describe all possible side effects. Call your doctor for medical advice about side effects. You may report side effects to FDA at 1-800-FDA-1088.  Where should I keep my medication? Because this is an emergency medication, you should keep it with  you at all times.  Keep out of the reach of children and pets. Store between 20 and 25 degrees C (68 and 77 degrees F). Do not freeze. Throw away any unused medication after the expiration date. Keep in original box until ready to use.  NOTE: This sheet is a summary. It may not cover all possible information. If you have questions about this medicine, talk to your doctor, pharmacist, or health care provider.   2023 Elsevier/Gold Standard (2021-08-12 00:00:00)  ____________________________________________________________________________________________

## 2022-11-30 NOTE — Progress Notes (Signed)
PROVIDER NOTE: Interpretation of information contained herein should be left to medically-trained personnel. Specific patient instructions are provided elsewhere under "Patient Instructions" section of medical record. This document was created in part using STT-dictation technology, any transcriptional errors that may result from this process are unintentional.  Patient: Wanda Martinez Type: Established DOB: 03/17/1935 MRN: [id number] PCP: Einar Pheasant, MD  Service: Procedure DOS: 11/30/2022 Setting: Ambulatory Location: Ambulatory outpatient facility Delivery: Face-to-face Provider: Gaspar Cola, MD Specialty: Interventional Pain Management Specialty designation: 09 Location: Outpatient facility Ref. Prov.: Einar Pheasant, MD    Procedure:           Type: Lumbar Facet, Medial Branch Radiofrequency Ablation (RFA) #2  Laterality: Left (-LT)  Level: L2, L3, L4, L5, & S1 Medial Branch Level(s). These levels will denervate the L3-4, L4-5 and L5-S1 lumbar facet joints.  Imaging: Fluoroscopy-guided         Anesthesia: Local anesthesia (1-2% Lidocaine) Anxiolysis: IV Versed         Sedation:                         DOS: 11/30/2022  Performed by: Gaspar Cola, MD  Purpose: Therapeutic/Palliative Indications: Low back pain severe enough to impact quality of life or function. Indications: 1. Lumbar facet syndrome (Bilateral) (R>L)   2. Spondylosis without myelopathy or radiculopathy, lumbosacral region   3. Lumbar facet hypertrophy (Multilevel) (Bilateral)   4. DDD (degenerative disc disease), lumbosacral   5. Chronic low back pain (1ry area of Pain) (Bilateral) (R>L) w/o sciatica    [name] has been dealing with the above chronic pain for longer than three months and has either failed to respond, was unable to tolerate, or simply did not get enough benefit from other more conservative therapies including, but not limited to: 1. Over-the-counter medications 2.  Anti-inflammatory medications 3. Muscle relaxants 4. Membrane stabilizers 5. Opioids 6. Physical therapy and/or chiropractic manipulation 7. Modalities (Heat, ice, etc.) 8. Invasive techniques such as nerve blocks. [name] has attained more than 50% relief of the pain from a series of diagnostic injections conducted in separate occasions.  Pain Score: Pre-procedure: 8 /10 Post-procedure: 8 /10     Position / Prep / Materials:  Position: Prone  Prep solution: DuraPrep (Iodine Povacrylex [0.7% available iodine] and Isopropyl Alcohol, 74% w/w) Prep Area: Entire Lumbosacral Region (Lower back from mid-thoracic region to end of tailbone and from flank to flank.) Materials:  Tray: RFA (Radiofrequency) tray Needle(s):  Type: RFA (Teflon-coated radiofrequency ablation needles) Gauge (G): 22  Length: Regular (10cm) Qty: 5  Pre-op H&P Assessment:  [name] is a 87 y.o. (year old), female patient, seen today for interventional treatment. She  has a past surgical history that includes Tonsillectomy and adenoidectomy (81); Vesicovaginal fistula closure w/ TAH (1983); Breast surgery (1986); Breast enhancement surgery (1987); Breast implant removal; Breast implant removal (Right, 08/29/2012); Mastectomy (08/2012); Abdominal hysterectomy; Colonoscopy with propofol (N/A, 09/13/2016); and Breast biopsy (2013). [name] has a current medication list which includes the following prescription(s): amlodipine, aspirin ec, calcium carbonate, citalopram, cyclobenzaprine, donepezil, gabapentin, glucosamine sulfate, glucosamine-chondroitin, hydrochlorothiazide, letrozole, losartan, lubiprostone, magnesium oxide, melatonin, meloxicam, metoprolol succinate, multivitamin, nitrofurantoin (macrocrystal-monohydrate), omega-3, pantoprazole, rosuvastatin, and hydrocodone-acetaminophen, and the following Facility-Administered Medications: lactated ringers, lidocaine, midazolam, pentafluoroprop-tetrafluoroeth,  ropivacaine (pf) 2 mg/ml (0.2%), and triamcinolone acetonide. Her primarily concern today is the Back Pain (Left, lower)  Initial Vital Signs:  Pulse/HCG Rate: 99  Temp: (!) 97.2 F (36.2 C) Resp: 14  BP: (!) 140/70 SpO2: 98 %  BMI: Estimated body mass index is 31.41 kg/m as calculated from the following:   Height as of this encounter: '5\' 4"'$  (1.626 m).   Weight as of this encounter: 183 lb (83 kg).  Risk Assessment: Allergies: Reviewed. She is allergic to sulfa antibiotics and vesicare [solifenacin].  Allergy Precautions: None required Coagulopathies: Reviewed. None identified.  Blood-thinner therapy: None at this time Active Infection(s): Reviewed. None identified. [name] is afebrile  Site Confirmation: [name] was asked to confirm the procedure and laterality before marking the site Procedure checklist: Completed Consent: Before the procedure and under the influence of no sedative(s), amnesic(s), or anxiolytics, the patient was informed of the treatment options, risks and possible complications. To fulfill our ethical and legal obligations, as recommended by the American Medical Association's Code of Ethics, I have informed the patient of my clinical impression; the nature and purpose of the treatment or procedure; the risks, benefits, and possible complications of the intervention; the alternatives, including doing nothing; the risk(s) and benefit(s) of the alternative treatment(s) or procedure(s); and the risk(s) and benefit(s) of doing nothing. The patient was provided information about the general risks and possible complications associated with the procedure. These may include, but are not limited to: failure to achieve desired goals, infection, bleeding, organ or nerve damage, allergic reactions, paralysis, and death. In addition, the patient was informed of those risks and complications associated to Spine-related procedures, such as failure to decrease pain; infection (i.e.:  Meningitis, epidural or intraspinal abscess); bleeding (i.e.: epidural hematoma, subarachnoid hemorrhage, or any other type of intraspinal or peri-dural bleeding); organ or nerve damage (i.e.: Any type of peripheral nerve, nerve root, or spinal cord injury) with subsequent damage to sensory, motor, and/or autonomic systems, resulting in permanent pain, numbness, and/or weakness of one or several areas of the body; allergic reactions; (i.e.: anaphylactic reaction); and/or death. Furthermore, the patient was informed of those risks and complications associated with the medications. These include, but are not limited to: allergic reactions (i.e.: anaphylactic or anaphylactoid reaction(s)); adrenal axis suppression; blood sugar elevation that in diabetics may result in ketoacidosis or comma; water retention that in patients with history of congestive heart failure may result in shortness of breath, pulmonary edema, and decompensation with resultant heart failure; weight gain; swelling or edema; medication-induced neural toxicity; particulate matter embolism and blood vessel occlusion with resultant organ, and/or nervous system infarction; and/or aseptic necrosis of one or more joints. Finally, the patient was informed that Medicine is not an exact science; therefore, there is also the possibility of unforeseen or unpredictable risks and/or possible complications that may result in a catastrophic outcome. The patient indicated having understood very clearly. We have given the patient no guarantees and we have made no promises. Enough time was given to the patient to ask questions, all of which were answered to the patient's satisfaction. Ms. Faux has indicated that she wanted to continue with the procedure. Attestation: I, the ordering provider, attest that I have discussed with the patient the benefits, risks, side-effects, alternatives, likelihood of achieving goals, and potential problems during recovery for the  procedure that I have provided informed consent. Date  Time: 11/30/2022  9:54 AM  Pre-Procedure Preparation:  Monitoring: As per clinic protocol. Respiration, ETCO2, SpO2, BP, heart rate and rhythm monitor placed and checked for adequate function Safety Precautions: Patient was assessed for positional comfort and pressure points before starting the procedure. Time-out: I initiated and conducted the "Time-out" before starting the  procedure, as per protocol. The patient was asked to participate by confirming the accuracy of the "Time Out" information. Verification of the correct person, site, and procedure were performed and confirmed by me, the nursing staff, and the patient. "Time-out" conducted as per Joint Commission's Universal Protocol (UP.01.01.01). Time:    Description of Procedure:          Laterality: Left Levels:  L2, L3, L4, L5, & S1 Medial Branch Level(s). Safety Precautions: Aspiration looking for blood return was conducted prior to all injections. At no point did we inject any substances, as a needle was being advanced. Before injecting, the patient was told to immediately notify me if she was experiencing any new onset of "ringing in the ears, or metallic taste in the mouth". No attempts were made at seeking any paresthesias. Safe injection practices and needle disposal techniques used. Medications properly checked for expiration dates. SDV (single dose vial) medications used. After the completion of the procedure, all disposable equipment used was discarded in the proper designated medical waste containers. Local Anesthesia: Protocol guidelines were followed. The patient was positioned over the fluoroscopy table. The area was prepped in the usual manner. The time-out was completed. The target area was identified using fluoroscopy. A 12-in long, straight, sterile hemostat was used with fluoroscopic guidance to locate the targets for each level blocked. Once located, the skin was marked  with an approved surgical skin marker. Once all sites were marked, the skin (epidermis, dermis, and hypodermis), as well as deeper tissues (fat, connective tissue and muscle) were infiltrated with a small amount of a short-acting local anesthetic, loaded on a 10cc syringe with a 25G, 1.5-in  Needle. An appropriate amount of time was allowed for local anesthetics to take effect before proceeding to the next step. Technical description of process:  Radiofrequency Ablation (RFA) L2 Medial Branch Nerve RFA: The target area for the L2 medial branch is at the junction of the postero-lateral aspect of the superior articular process and the superior, posterior, and medial edge of the transverse process of L3. Under fluoroscopic guidance, a Radiofrequency needle was inserted until contact was made with os over the superior postero-lateral aspect of the pedicular shadow (target area). Sensory and motor testing was conducted to properly adjust the position of the needle. Once satisfactory placement of the needle was achieved, the numbing solution was slowly injected after negative aspiration for blood. 2.0 mL of the nerve block solution was injected without difficulty or complication. After waiting for at least 3 minutes, the ablation was performed. Once completed, the needle was removed intact. L3 Medial Branch Nerve RFA: The target area for the L3 medial branch is at the junction of the postero-lateral aspect of the superior articular process and the superior, posterior, and medial edge of the transverse process of L4. Under fluoroscopic guidance, a Radiofrequency needle was inserted until contact was made with os over the superior postero-lateral aspect of the pedicular shadow (target area). Sensory and motor testing was conducted to properly adjust the position of the needle. Once satisfactory placement of the needle was achieved, the numbing solution was slowly injected after negative aspiration for blood. 2.0 mL of  the nerve block solution was injected without difficulty or complication. After waiting for at least 3 minutes, the ablation was performed. Once completed, the needle was removed intact. L4 Medial Branch Nerve RFA: The target area for the L4 medial branch is at the junction of the postero-lateral aspect of the superior articular process and the superior,  posterior, and medial edge of the transverse process of L5. Under fluoroscopic guidance, a Radiofrequency needle was inserted until contact was made with os over the superior postero-lateral aspect of the pedicular shadow (target area). Sensory and motor testing was conducted to properly adjust the position of the needle. Once satisfactory placement of the needle was achieved, the numbing solution was slowly injected after negative aspiration for blood. 2.0 mL of the nerve block solution was injected without difficulty or complication. After waiting for at least 3 minutes, the ablation was performed. Once completed, the needle was removed intact. L5 Medial Branch Nerve RFA: The target area for the L5 medial branch is at the junction of the postero-lateral aspect of the superior articular process of S1 and the superior, posterior, and medial edge of the sacral ala. Under fluoroscopic guidance, a Radiofrequency needle was inserted until contact was made with os over the superior postero-lateral aspect of the pedicular shadow (target area). Sensory and motor testing was conducted to properly adjust the position of the needle. Once satisfactory placement of the needle was achieved, the numbing solution was slowly injected after negative aspiration for blood. 2.0 mL of the nerve block solution was injected without difficulty or complication. After waiting for at least 3 minutes, the ablation was performed. Once completed, the needle was removed intact. S1 Medial Branch Nerve RFA: The target area for the S1 medial branch is located inferior to the junction of the S1  superior articular process and the L5 inferior articular process, posterior, inferior, and lateral to the 6 o'clock position of the L5-S1 facet joint, just superior to the S1 posterior foramen. Under fluoroscopic guidance, the Radiofrequency needle was advanced until contact was made with os over the Target area. Sensory and motor testing was conducted to properly adjust the position of the needle. Once satisfactory placement of the needle was achieved, the numbing solution was slowly injected after negative aspiration for blood. 2.0 mL of the nerve block solution was injected without difficulty or complication. After waiting for at least 3 minutes, the ablation was performed. Once completed, the needle was removed intact. Radiofrequency lesioning (ablation):  Radiofrequency Generator: Medtronic AccurianTM AG 1000 RF Generator Sensory Stimulation Parameters: 50 Hz was used to locate & identify the nerve, making sure that the needle was positioned such that there was no sensory stimulation below 0.3 V or above 0.7 V. Motor Stimulation Parameters: 2 Hz was used to evaluate the motor component. Care was taken not to lesion any nerves that demonstrated motor stimulation of the lower extremities at an output of less than 2.5 times that of the sensory threshold, or a maximum of 2.0 V. Lesioning Technique Parameters: Standard Radiofrequency settings. (Not bipolar or pulsed.) Temperature Settings: 80 degrees C Lesioning time: 60 seconds Intra-operative Compliance: Compliant  Once the entire procedure was completed, the treated area was cleaned, making sure to leave some of the prepping solution back to take advantage of its long term bactericidal properties.    Illustration of the posterior view of the lumbar spine and the posterior neural structures. Laminae of L2 through S1 are labeled. DPRL5, dorsal primary ramus of L5; DPRS1, dorsal primary ramus of S1; DPR3, dorsal primary ramus of L3; FJ, facet  (zygapophyseal) joint L3-L4; I, inferior articular process of L4; LB1, lateral branch of dorsal primary ramus of L1; IAB, inferior articular branches from L3 medial branch (supplies L4-L5 facet joint); IBP, intermediate branch plexus; MB3, medial branch of dorsal primary ramus of L3; NR3, third lumbar nerve  root; S, superior articular process of L5; SAB, superior articular branches from L4 (supplies L4-5 facet joint also); TP3, transverse process of L3.  Vitals:   11/30/22 0953  BP: (!) 140/70  Pulse: 99  Resp: 14  Temp: (!) 97.2 F (36.2 C)  TempSrc: Temporal  SpO2: 98%  Weight: 183 lb (83 kg)  Height: '5\' 4"'$  (1.626 m)    Start Time:   hrs. End Time:   hrs.  Imaging Guidance (Spinal):          Type of Imaging Technique: Fluoroscopy Guidance (Spinal) Indication(s): Assistance in needle guidance and placement for procedures requiring needle placement in or near specific anatomical locations not easily accessible without such assistance. Exposure Time: Please see nurses notes. Contrast: None used. Fluoroscopic Guidance: I was personally present during the use of fluoroscopy. "Tunnel Vision Technique" used to obtain the best possible view of the target area. Parallax error corrected before commencing the procedure. "Direction-depth-direction" technique used to introduce the needle under continuous pulsed fluoroscopy. Once target was reached, antero-posterior, oblique, and lateral fluoroscopic projection used confirm needle placement in all planes. Images permanently stored in EMR. Interpretation: No contrast injected. I personally interpreted the imaging intraoperatively. Adequate needle placement confirmed in multiple planes. Permanent images saved into the patient's record.  Antibiotic Prophylaxis:   Anti-infectives (From admission, onward)    None      Indication(s): None identified  Post-operative Assessment:  Post-procedure Vital Signs:  Pulse/HCG Rate: 99  Temp: (!) 97.2 F  (36.2 C) Resp: 14 BP: (!) 140/70 SpO2: 98 %  EBL: None  Complications: No immediate post-treatment complications observed by team, or reported by patient.  Note: The patient tolerated the entire procedure well. A repeat set of vitals were taken after the procedure and the patient was kept under observation following institutional policy, for this type of procedure. Post-procedural neurological assessment was performed, showing return to baseline, prior to discharge. The patient was provided with post-procedure discharge instructions, including a section on how to identify potential problems. Should any problems arise concerning this procedure, the patient was given instructions to immediately contact us, at any time, without hesitation. In any case, we plan to contact the patient by telephone for a follow-up status report regarding this interventional procedure.  Comments:  No additional relevant information.  Plan of Care  Orders:  Orders Placed This Encounter  Procedures   Radiofrequency,Lumbar    Scheduling Instructions:     Side(s): Left-sided     Level: L3-4, L4-5, & L5-S1 Facets (L2, L3, L4, L5, & S1 Medial Branch Nerves)     Sedation: Patient's choice.     Timeframe: Today    Order Specific Question:   Where will this procedure be performed?    Answer:   [facility]   DG [facility] C-ARM 1-60 MIN NO REPORT    Intraoperative interpretation by procedural physician at [place].    Standing Status:   Standing    Number of Occurrences:   1    Order Specific Question:   Reason for exam:    Answer:   Assistance in needle guidance and placement for procedures requiring needle placement in or near specific anatomical locations not easily accessible without such assistance.   Provide equipment / supplies at bedside    Procedure tray: "Radiofrequency Tray" Additional material: Large hemostat (x1); Small hemostat (x1); Towels (x8); 4x4 sterile sponge pack  (x1) Needle type: Teflon-coated Radiofrequency Needle (Disposable  single use) Size: Regular Quantity: 5  Standing Status:   Standing    Number of Occurrences:   1    Order Specific Question:   Specify    Answer:   Radiofrequency Tray   Chronic Opioid Analgesic:  Hydrocodone/APAP 5/325, 1 tab PO q 8 hrs (15 mg/day of hydrocodone) MME/day: 15 mg/day.   Medications ordered for procedure: Meds ordered this encounter  Medications   lidocaine (XYLOCAINE) 2 % (with pres) injection 400 mg   pentafluoroprop-tetrafluoroeth (GEBAUERS) aerosol   lactated ringers infusion   midazolam (VERSED) injection 0.5-2 mg    Make sure Flumazenil is available in the pyxis when using this medication. If oversedation occurs, administer 0.2 mg IV over 15 sec. If after 45 sec no response, administer 0.2 mg again over 1 min; may repeat at 1 min intervals; not to exceed 4 doses (1 mg)   ropivacaine (PF) 2 mg/mL (0.2%) (NAROPIN) injection 9 mL   triamcinolone acetonide (KENALOG-40) injection 40 mg   Medications administered: Consuello T. Cammarano had no medications administered during this visit.  See the medical record for exact dosing, route, and time of administration.  Follow-up plan:   No follow-ups on file.       Interventional Therapies  Risk  Complexity Considerations:   Estimated body mass index is 30.73 kg/m as calculated from the following:   Height as of this encounter: '5\' 4"'$  (1.626 m).   Weight as of this encounter: 179 lb (81.2 kg). WNL   Planned  Pending:   Therapeutic left lumbar facet (L4-S1) RFA #2    Under consideration:   Diagnostic right genicular NB  Diagnostic bilateral cervical facet MBB  Therapeutic right L3 and L4 TFESI #1    Completed:   Therapeutic right superior/lateral trapezius muscle TPI x1 (04/19/2021)  Therapeutic right thoracic back TPI x2 (10/17/2018) (DKFUA)  Therapeutic left thoracic back TPI x1 (10/17/2018) (DKFUA)  Therapeutic right quadratus lumborum and  erector spinae muscle TPI x1 (02/12/2018)  Therapeutic bilateral lumbar facet MBB (L2-S1) (FCT: L3-4,L4-5,L5-S1) x5 (08/09/2021) (100/100/50)  Therapeutic bilateral lumbar facet MBB (L4-S1) (FCT: L5-S1) x1 (08/29/2022) (100/100/100 x3 days/R:0L:100)  Therapeutic right lumbar facet MBB (L2-S1) x4 (12/21/2020) (DKFUA)  Therapeutic right lumbar facet RFA x3 (09/28/2022) ( Therapeutic left lumbar facet RFA x1 (02/12/2018) (100/60/90/>75)  Diagnostic right SI joint Blk x3 (12/21/2020)  (DKFUA)  Diagnostic left SI joint Blk x1 (08/09/2017) (100/90/20/<25)  Therapeutic right SI joint RFA x3 (06/05/2019) (100/100/85/>50)  Therapeutic midline L2-3 LESI x1 (10/04/2017) (100/100/85/>50)  Therapeutic right L3-4 LESI x2 (11/04/2020) (100/100/20/<50)  Therapeutic right L4-5 LESI x2 (03/30/2022) (100/100/75/75)  Therapeutic right L5-S1 LESI x2 (12/04/2019) (100/100/50/75)  Diagnostic/therapeutic right L3 and L4 TFESI x1 (07/27/2022) (100/100/75/50)  Therapeutic right L5 TFESI x4 (02/14/2022) (100/100/70/75)  Therapeutic right S1 TFESI x1 (11/04/2020) (100/100/20/<50)  Diagnostic bilateral superficial trochanteric bursa injec. x1 (04/30/2018) (DKFUA)  Diagnostic right IA hip injection x1 (02/13/2019) (DKFUA)  Diagnostic/therapeutic right IA steroid knee inj. x3 (08/15/2022) (100/100/100/80)  Therapeutic right (Zilretta) knee injection x1 (10/06/2021)  (DKFUA)  Therapeutic right (Monovisc) knee injection x1 (06/07/2021) (100/100/100/75)    Therapeutic  Palliative (PRN) options:   Therapeutic/palliative lumbar facet RFA  Therapeutic/palliative lumbar facet MBB  Therapeutic left L2 TFESI (this level is responsible for her low back pain and left lower extremity pain.)   Pharmacotherapy:  Nonopioids transfer 12/21/2020: Gabapentin Recommendations:   None at this time.     Recent Visits Date Type Provider Dept  09/28/22 Procedure visit Milinda Pointer, MD [facility]  09/12/22 Office Visit Milinda Pointer, MD  [facility]  Showing recent visits within past 90 days and meeting all other requirements Today's Visits Date Type Provider Dept  11/30/22 Procedure visit Milinda Pointer, MD [facility]  Showing today's visits and meeting all other requirements Future Appointments No visits were found meeting these conditions. Showing future appointments within next 90 days and meeting all other requirements  Disposition: Discharge home  Discharge (Date  Time): 11/30/2022;   hrs.   Primary Care Physician: Einar Pheasant, MD Location: [facility] Note by: Gaspar Cola, MD Date: 11/30/2022; Time: 10:17 AM  Disclaimer:  Medicine is not an exact science. The only guarantee in medicine is that nothing is guaranteed. It is important to note that the decision to proceed with this intervention was based on the information collected from the patient. The Data and conclusions were drawn from the patient's questionnaire, the interview, and the physical examination. Because the information was provided in large part by the patient, it cannot be guaranteed that it has not been purposely or unconsciously manipulated. Every effort has been made to obtain as much relevant data as possible for this evaluation. It is important to note that the conclusions that lead to this procedure are derived in large part from the available data. Always take into account that the treatment will also be dependent on availability of resources and existing treatment guidelines, considered by other Pain Management Practitioners as being common knowledge and practice, at the time of the intervention. For Medico-Legal purposes, it is also important to point out that variation in procedural techniques and pharmacological choices are the acceptable norm. The indications, contraindications, technique, and results of the above procedure should only be interpreted and judged by a  Board-Certified Interventional Pain Specialist with extensive familiarity and expertise in the same exact procedure and technique.

## 2022-11-30 NOTE — Progress Notes (Signed)
Nursing Pain Medication Assessment:  Safety precautions to be maintained throughout the outpatient stay will include: orient to surroundings, keep bed in low position, maintain call bell within reach at all times, provide assistance with transfer out of bed and ambulation.  Medication Inspection Compliance: Wanda Martinez did not comply with our request to bring her pills to be counted. She was reminded that bringing the medication bottles, even when empty, is a requirement.  Medication: Hydrocodene/Apap Pill/Patch Count: None available to be counted. Bottle Appearance: No container available. Did not bring bottle(s) to appointment. Filled Date: N/A Last Medication intake:  Yesterday

## 2022-11-30 NOTE — Progress Notes (Signed)
PROVIDER NOTE: Information contained herein reflects review and annotations entered in association with encounter. Interpretation of such information and data should be left to medically-trained personnel. Information provided to patient can be located elsewhere in the medical record under "Patient Instructions". Document created using STT-dictation technology, any transcriptional errors that may result from process are unintentional.    Patient: Wanda Martinez  Service Category: E/M  Provider: Gaspar Cola, MD  DOB: November 13, 1935  DOS: 11/30/2022  Referring Provider: Einar Pheasant, MD  MRN: [id number]  Specialty: Interventional Pain Management  PCP: Einar Pheasant, MD  Type: Established Patient  Setting: Ambulatory outpatient    Location: Office  Delivery: Face-to-face     HPI  [name], a 87 y.o. year old female, is here today because of her Facet syndrome, lumbar [M47.816]. [name] primary complain today is Back Pain (Left, lower) Last encounter: My last encounter with her was on 10/30/2022. Pertinent problems: [name] has Headache; Parkinson's disease; Chronic low back pain (1ry area of Pain) (Bilateral) (R>L) w/o sciatica; Lumbar spondylosis; Chronic hip pain (Bilateral); Chronic neck pain; Cervical spondylosis; Chronic cervical radicular pain (Right); Diffuse myofascial pain syndrome; Neurogenic pain; Chronic upper back pain (Right); Myofascial pain syndrome (Right) (cervicothoracic); Lumbar facet syndrome (Bilateral) (R>L); History of breast cancer; Cervical facet hypertrophy; Cervical facet syndrome ([place]); Chronic shoulder pain (Right); Chronic pain syndrome; Chronic sacroiliac joint pain (Left); Chronic sacroiliac joint pain (Right); 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, L4-5, L5-S1) (Right); Lumbar spinal stenosis (with neurogenic claudication) (L3-4); Chronic lower extremity pain (2ry area of Pain) (Right); Chronic lumbar radicular pain (S1) (Right);  Trochanteric bursitis of hip (Bilateral); Spondylosis without myelopathy or radiculopathy, lumbosacral region; Trigger point with back pain (Right); DDD (degenerative disc disease), lumbosacral; Chronic upper extremity pain (Right); Chronic thoracic back pain (Bilateral) (L>R); Trigger point of thoracic region (Bilateral) (L>R); Other specified dorsopathies, sacral and sacrococcygeal region; Sacroiliac joint dysfunction (Right); Osteoarthritis of sacroiliac joint (Right); Somatic dysfunction of sacroiliac joint (Right); Chronic musculoskeletal pain; Facial pain; Chronic hip pain (Right); Osteoarthritis of hip (Right); Left ear pain; Abnormal MRI, lumbar spine (12/30/2019); Osteoarthritis involving multiple joints; Other spondylosis, sacral and sacrococcygeal region; Lumbar facet hypertrophy (Multilevel) (Bilateral); Chronic knee pain (Right); Osteoarthritis of knee (Right); Trigger point of shoulder region (Right); Osteoarthritis of AC (acromioclavicular) joint (Right); Osteoarthritis of shoulder (Right); Unspecified injury of muscle(s) and tendon(s) of the rotator cuff of shoulder, sequela (Right); Cervicalgia; DDD (degenerative disc disease), lumbar; Lumbar radicular pain (Bilateral); Lumbar foraminal stenosis (Right: L3-4, L4-5, L5-S1) (Left: L2-3); Abnormal MRI, hip (Right) (05/04/2022); Abnormal MRI, knee (01/04/2022); and Tricompartment osteoarthritis of knee (Right) on their pertinent problem list. Pain Assessment: Severity of Chronic pain is reported as a 8 /10. Location: Back Left, Lower/ . Onset: More than a month ago. Quality: Aching. Timing: Constant. Modifying factor(s): sitting, rest. Vitals:  height is _0  (1.626 m) and weight is 183 lb (83 kg). Her temporal temperature is 97.2 F (36.2 C) (abnormal). Her blood pressure is 152/80 (abnormal) and her pulse is 99. Her respiration is 15 and oxygen saturation is 98%.  BMI: Estimated body mass index is 31.41 kg/m as calculated from the following:    Height as of this encounter: _1  (1.626 m).   Weight as of this encounter: 183 lb (83 kg).  Reason for encounter: both, medication management and post-procedure evaluation and assessment.  The patient indicates doing well with the current medication regimen. No adverse reactions or side effects reported to the medications.   RTCB: 02/28/2023  Nonopioids transfer 12/21/2020: Gabapentin  Post-procedure evaluation   Type: Lumbar Facet, Medial Branch Radiofrequency Ablation (RFA) #3  Laterality: Right (-RT)  Level: L2, L3, L4, L5, & S1 Medial Branch Level(s). These levels will denervate the L3-4, L4-5 and L5-S1 lumbar facet joints.  Imaging: Fluoroscopy-guided         Anesthesia: Local anesthesia (1-2% Lidocaine) Anxiolysis: None Sedation: No Sedation                       DOS: 09/28/2022  Performed by: Gaspar Cola, MD  Purpose: Therapeutic/Palliative Indications: Low back pain severe enough to impact quality of life or function. Indications: 1. Lumbar facet syndrome (Bilateral) (R>L)   2. Spondylosis without myelopathy or radiculopathy, lumbosacral region   3. Lumbar facet hypertrophy (Multilevel) (Bilateral)   4. DDD (degenerative disc disease), lumbosacral   5. Chronic low back pain (1ry area of Pain) (Bilateral) (R>L) w/o sciatica    Pain Score: Pre-procedure: 10-Worst pain ever/10 Post-procedure: 10-Worst pain ever/10     Effectiveness:  Initial hour after procedure: 100 %. Subsequent 4-6 hours post-procedure: 100 %. Analgesia past initial 6 hours: 75 %. Ongoing improvement:  Analgesic: The indicates having an ongoing 75% improvement in the pain on the right lower back. Function: [name] reports improvement in function ROM: [name] reports improvement in ROM  Pharmacotherapy Assessment  Analgesic: Hydrocodone/APAP 5/325, 1 tab PO q 8 hrs (15 mg/day of hydrocodone) MME/day: 15 mg/day.   Monitoring: [place] PMP: PDMP reviewed during this encounter.        Pharmacotherapy: No side-effects or adverse reactions reported. Compliance: No problems identified. Effectiveness: Clinically acceptable.  [name], RN  11/30/2022 10:02 AM  Sign when Signing Visit Nursing Pain Medication Assessment:  Safety precautions to be maintained throughout the outpatient stay will include: orient to surroundings, keep bed in low position, maintain call bell within reach at all times, provide assistance with transfer out of bed and ambulation.  Medication Inspection Compliance: Ms. Stebbins did not comply with our request to bring her pills to be counted. She was reminded that bringing the medication bottles, even when empty, is a requirement.  Medication: Hydrocodene/Apap Pill/Patch Count: None available to be counted. Bottle Appearance: No container available. Did not bring bottle(s) to appointment. Filled Date: N/A Last Medication intake:  Yesterday    No results found for: "CBDTHCR" No results found for: "D8THCCBX" No results found for: "D9THCCBX"  UDS:  Summary  Date Value Ref Range Status  06/12/2022 Note  Final    Comment:    ==================================================================== [employer] [facility] (MW) ==================================================================== Test                             Result       Flag       Units  Drug Present and Declared for Prescription Verification   Hydrocodone                    1766         EXPECTED   ng/mg creat   Dihydrocodeine                 303          EXPECTED   ng/mg creat   Norhydrocodone                 1597         EXPECTED   ng/mg  creat    Sources of hydrocodone include scheduled prescription medications.    Dihydrocodeine and norhydrocodone are expected metabolites of    hydrocodone. Dihydrocodeine is also available as a scheduled    prescription medication.  ==================================================================== Test                      Result    Flag    Units      Ref Range   Creatinine              38               mg/dL      >=20 ==================================================================== Declared Medications:  The flagging and interpretation on this report are based on the  following declared medications.  Unexpected results may arise from  inaccuracies in the declared medications.   **Note: The testing scope of this panel includes these medications:   Hydrocodone (Norco)   **Note: The testing scope of this panel does not include the  following reported medications:   Acetaminophen (Norco)  Amlodipine  Aspirin  Calcium  Chondroitin  Citalopram (Celexa)  Cyclobenzaprine (Flexeril)  Donepezil (Aricept)  Gabapentin (Neurontin)  Glucosamine  Letrozole (Femara)  Losartan (Cozaar)  Lubiprostone (Amitiza)  Magnesium (Mag-Ox)  Melatonin  Meloxicam (Mobic)  Metoprolol  Multivitamin  Nitrofurantoin (Macrobid)  Omega-3 Fatty Acids  Omeprazole  Pantoprazole (Protonix)  Rosuvastatin (Crestor) ==================================================================== For clinical consultation, please call [phone]. ====================================================================       ROS  Constitutional: Denies any fever or chills Gastrointestinal: No reported hemesis, hematochezia, vomiting, or acute GI distress Musculoskeletal: Denies any acute onset joint swelling, redness, loss of ROM, or weakness Neurological: No reported episodes of acute onset apraxia, aphasia, dysarthria, agnosia, amnesia, paralysis, loss of coordination, or loss of consciousness  Medication Review  Glucosamine Sulfate, Glucosamine-Chondroitin, HYDROcodone-acetaminophen, Omega-3, amLODipine, aspirin EC, calcium carbonate, citalopram, cyclobenzaprine, donepezil, gabapentin, hydrochlorothiazide, letrozole, losartan, lubiprostone, magnesium oxide, melatonin, meloxicam, metoprolol succinate, multivitamin, naloxone, nitrofurantoin  (macrocrystal-monohydrate), pantoprazole, and rosuvastatin  History Review  Allergy: Ms. Payson is allergic to sulfa antibiotics and vesicare [solifenacin]. Drug: Ms. Bruck  reports no history of drug use. Alcohol:  reports no history of alcohol use. Tobacco:  reports that she has never smoked. She has never used smokeless tobacco. Social: Ms. Primeau  reports that she has never smoked. She has never used smokeless tobacco. She reports that she does not drink alcohol and does not use drugs. Medical:  has a past medical history of Acute postoperative pain (10/25/2017), Anemia, Arm pain (07/26/2015), Arthritis, Arthritis, degenerative (03/26/2014), Back pain (11/01/2013), Bladder infection (06/2018), Breast cancer ([place]), Breast cancer ([place]), CHEST PAIN (04/29/2010), Chronic cystitis, Cystocele (02/22/2013), Cystocele, midline (08/19/2013), Degeneration of intervertebral disc of lumbosacral region (03/26/2014), DYSPNEA (04/29/2010), Enthesopathy of hip (03/26/2014), GERD (gastroesophageal reflux disease), Hiatal hernia, HTN (hypertension), Hypokalemia (06/2018), Hyponatremia (06/2018), LBP (low back pain) (03/26/2014), Neck pain (11/01/2013), Parkinson disease, Pneumonia (06/2018), Sinusitis (02/07/2015), Skin lesions (07/12/2014), and Urinary incontinence. Surgical: Ms. Fonner  has a past surgical history that includes Tonsillectomy and adenoidectomy (81); Vesicovaginal fistula closure w/ TAH (1983); Breast surgery (1986); Breast enhancement surgery (1987); Breast implant removal; Breast implant removal (Right, 08/29/2012); Mastectomy (08/2012); Abdominal hysterectomy; Colonoscopy with propofol (N/A, 09/13/2016); and Breast biopsy (2013). Family: family history includes Colon polyps in her father; Diabetes in her father; Parkinson's disease in her mother; Stroke in her father and mother.  Laboratory Chemistry Profile   Renal Lab Results  Component Value Date   BUN 22 09/26/2022  CREATININE 1.40 (H) 09/26/2022   BCR 20  06/11/2020   GFR 34.44 (L) 09/26/2022   GFRAA >60 07/27/2020   GFRNONAA 47 (L) 05/09/2022    Hepatic Lab Results  Component Value Date   AST 22 09/26/2022   ALT 16 09/26/2022   ALBUMIN 4.0 09/26/2022   ALKPHOS 34 (L) 09/26/2022    Electrolytes Lab Results  Component Value Date   NA 131 (L) 10/02/2022   K 3.8 09/26/2022   CL 97 09/26/2022   CALCIUM 9.5 09/26/2022   MG 2.2 07/17/2018    Bone Lab Results  Component Value Date   VD25OH 66.95 09/26/2022   25OHVITD1 42 07/17/2018   25OHVITD2 <1.0 07/17/2018   25OHVITD3 42 07/17/2018    Inflammation (CRP: Acute Phase) (ESR: Chronic Phase) Lab Results  Component Value Date   CRP 3 07/17/2018   ESRSEDRATE 27 07/17/2018         Note: Above Lab results reviewed.  Recent Imaging Review  DG PAIN CLINIC C-ARM 1-60 MIN NO REPORT Fluoro was used, but no Radiologist interpretation will be provided.  Please refer to "NOTES" tab for provider progress note. Note: Reviewed        Physical Exam  General appearance: Well nourished, well developed, and well hydrated. In no apparent acute distress Mental status: Alert, oriented x 3 (person, place, & time)       Respiratory: No evidence of acute respiratory distress Eyes: PERLA Vitals: BP (!) 152/80   Pulse 99   Temp (!) 97.2 F (36.2 C) (Temporal)   Resp 15   Ht _0  (1.626 m)   Wt 183 lb (83 kg)   LMP 12/18/1981   SpO2 98%   BMI 31.41 kg/m  BMI: Estimated body mass index is 31.41 kg/m as calculated from the following:   Height as of this encounter: _1  (1.626 m).   Weight as of this encounter: 183 lb (83 kg). Ideal: Ideal body weight: 54.7 kg (120 lb 9.5 oz) Adjusted ideal body weight: 66 kg (145 lb 8.9 oz)  Assessment   Diagnosis Status  1. Lumbar facet syndrome (Bilateral) (R>L)   2. Spondylosis without myelopathy or radiculopathy, lumbosacral region   3. Lumbar facet hypertrophy (Multilevel) (Bilateral)   4. DDD (degenerative disc disease), lumbosacral   5.  Chronic low back pain (1ry area of Pain) (Bilateral) (R>L) w/o sciatica   6. Chronic pain syndrome   7. Pharmacologic therapy   8. Chronic shoulder pain (Right)   9. Encounter for medication management   10. Encounter for chronic pain management   11. Chronic lower extremity pain (2ry area of Pain) (Right)   12. Chronic knee pain (Right)   13. Chronic use of opiate for therapeutic purpose   14. Unspecified injury of muscle(s) and tendon(s) of the rotator cuff of shoulder, sequela (Right)   15. Facet syndrome, lumbar   16. Facet hypertrophy of lumbar region   17. Chronic bilateral low back pain without sciatica    Improving Improving Stable   Updated Problems: No problems updated.  Plan of Care  Problem-specific:  No problem-specific Assessment & Plan notes found for this encounter.  Ms. Mekayla Soman has a current medication list which includes the following long-term medication(s): amlodipine, calcium carbonate, citalopram, cyclobenzaprine, donepezil, gabapentin, hydrochlorothiazide, losartan, magnesium oxide, meloxicam, metoprolol succinate, pantoprazole, rosuvastatin, hydrocodone-acetaminophen, [START ON 12/30/2022] hydrocodone-acetaminophen, and [START ON 01/29/2023] hydrocodone-acetaminophen.  Pharmacotherapy (Medications Ordered): Meds ordered this encounter  Medications   lidocaine (XYLOCAINE) 2 % (with pres) injection  400 mg   pentafluoroprop-tetrafluoroeth (GEBAUERS) aerosol   lactated ringers infusion   midazolam (VERSED) injection 0.5-2 mg    Make sure Flumazenil is available in the pyxis when using this medication. If oversedation occurs, administer 0.2 mg IV over 15 sec. If after 45 sec no response, administer 0.2 mg again over 1 min; may repeat at 1 min intervals; not to exceed 4 doses (1 mg)   ropivacaine (PF) 2 mg/mL (0.2%) (NAROPIN) injection 9 mL   triamcinolone acetonide (KENALOG-40) injection 40 mg   HYDROcodone-acetaminophen (NORCO/VICODIN) 5-325 MG tablet     Sig: Take 1 tablet by mouth 3 (three) times daily. Must last 30 days.    Dispense:  90 tablet    Refill:  0    DO NOT: delete (not duplicate); no partial-fill (will deny script to complete), no refill request (F/U required). DISPENSE: 1 day early if closed on fill date. WARN: No CNS-depressants within 8 hrs of med.   HYDROcodone-acetaminophen (NORCO/VICODIN) 5-325 MG tablet    Sig: Take 1 tablet by mouth 3 (three) times daily. Must last 30 days.    Dispense:  90 tablet    Refill:  0    DO NOT: delete (not duplicate); no partial-fill (will deny script to complete), no refill request (F/U required). DISPENSE: 1 day early if closed on fill date. WARN: No CNS-depressants within 8 hrs of med.   HYDROcodone-acetaminophen (NORCO/VICODIN) 5-325 MG tablet    Sig: Take 1 tablet by mouth 3 (three) times daily. Must last 30 days.    Dispense:  90 tablet    Refill:  0    DO NOT: delete (not duplicate); no partial-fill (will deny script to complete), no refill request (F/U required). DISPENSE: 1 day early if closed on fill date. WARN: No CNS-depressants within 8 hrs of med.   naloxone (NARCAN) nasal spray 4 mg/0.1 mL    Sig: Place 1 spray into the nose as needed for up to 365 doses (for opioid-induced respiratory depresssion). In case of emergency (overdose), spray once into each nostril. If no response within 3 minutes, repeat application and call [phone].    Dispense:  1 each    Refill:  0    Instruct patient in proper use of device.   Orders:  Orders Placed This Encounter  Procedures   Radiofrequency,Lumbar    Scheduling Instructions:     Side(s): Left-sided     Level: L3-4, L4-5, & L5-S1 Facets (L2, L3, L4, L5, & S1 Medial Branch Nerves)     Sedation: Patient's choice.     Timeframe: Today    Order Specific Question:   Where will this procedure be performed?    Answer:   [facility]   DG PAIN CLINIC C-ARM 1-60 MIN NO REPORT    Intraoperative interpretation by procedural physician at  [place].    Standing Status:   Standing    Number of Occurrences:   1    Order Specific Question:   Reason for exam:    Answer:   Assistance in needle guidance and placement for procedures requiring needle placement in or near specific anatomical locations not easily accessible without such assistance.   Informed Consent Details: Physician/Practitioner Attestation; Transcribe to consent form and obtain patient signature    Nursing Order: Transcribe to consent form and obtain patient signature. Note: Always confirm laterality of pain with Ms. Ermalinda Barrios, before procedure.    Order Specific Question:   Physician/Practitioner attestation of informed consent for procedure/surgical case  Answer:   I, the physician/practitioner, attest that I have discussed with the patient the benefits, risks, side effects, alternatives, likelihood of achieving goals and potential problems during recovery for the procedure that I have provided informed consent.    Order Specific Question:   Procedure    Answer:   Lumbar Facet Radiofrequency Ablation    Order Specific Question:   Physician/Practitioner performing the procedure    Answer:   Emberly Tomasso A. Dossie Arbour, MD    Order Specific Question:   Indication/Reason    Answer:   Low Back Pain, with our without leg pain, due to Facet Joint Arthralgia (Joint Pain) known as Lumbar Facet Syndrome, secondary to Lumbar, and/or Lumbosacral Spondylosis (Arthritis of the Spine), without myelopathy or radiculopathy (Nerve Damage).   Provide equipment / supplies at bedside    Procedure tray: "Radiofrequency Tray" Additional material: Large hemostat (x1); Small hemostat (x1); Towels (x8); 4x4 sterile sponge pack (x1) Needle type: Teflon-coated Radiofrequency Needle (Disposable  single use) Size: Regular Quantity: 5    Standing Status:   Standing    Number of Occurrences:   1    Order Specific Question:   Specify    Answer:   Radiofrequency Tray   Follow-up plan:    Return in about 6 weeks (around 01/11/2023) for Proc-day (T,Th), (VV), (PPE).    Interventional Therapies  Risk  Complexity Considerations:   WNL   Planned  Pending:   Therapeutic left lumbar facet (L4-S1) RFA #2    Under consideration:   Diagnostic right genicular NB  Diagnostic bilateral cervical facet MBB  Therapeutic right L3 and L4 TFESI #1    Completed:   Therapeutic right superior/lateral trapezius muscle TPI x1 (04/19/2021)  Therapeutic right thoracic back TPI x2 (10/17/2018) (DKFUA)  Therapeutic left thoracic back TPI x1 (10/17/2018) (DKFUA)  Therapeutic right quadratus lumborum and erector spinae muscle TPI x1 (02/12/2018)  Therapeutic bilateral lumbar facet MBB (L2-S1) (FCT: L3-4,L4-5,L5-S1) x5 (08/09/2021) (100/100/50)  Therapeutic bilateral lumbar facet MBB (L4-S1) (FCT: L5-S1) x1 (08/29/2022) (100/100/100 x3 days/R:0L:100)  Therapeutic right lumbar facet MBB (L2-S1) x4 (12/21/2020) (DKFUA)  Therapeutic right lumbar facet RFA x2 (06/04/2018) (100/90/75/75-100)  Therapeutic left lumbar facet RFA x1 (02/12/2018) (100/60/90/>75)  Diagnostic right SI joint Blk x3 (12/21/2020)  (DKFUA)  Diagnostic left SI joint Blk x1 (08/09/2017) (100/90/20/<25)  Therapeutic right SI joint RFA x3 (06/05/2019) (100/100/85/>50)  Therapeutic midline L2-3 LESI x1 (10/04/2017) (100/100/85/>50)  Therapeutic right L3-4 LESI x2 (11/04/2020) (100/100/20/<50)  Therapeutic right L4-5 LESI x2 (03/30/2022) (100/100/75/75)  Therapeutic right L5-S1 LESI x2 (12/04/2019) (100/100/50/75)  Diagnostic/therapeutic right L3 and L4 TFESI x1 (07/27/2022) (100/100/75/50)  Therapeutic right L5 TFESI x4 (02/14/2022) (100/100/70/75)  Therapeutic right S1 TFESI x1 (11/04/2020) (100/100/20/<50)  Diagnostic bilateral superficial trochanteric bursa injec. x1 (04/30/2018) (DKFUA)  Diagnostic right IA hip injection x1 (02/13/2019) (DKFUA)  Diagnostic/therapeutic right IA steroid knee inj. x3 (08/15/2022) (100/100/100/80)   Therapeutic right ([place]) knee injection x1 (10/06/2021)  (DKFUA)  Therapeutic right (Monovisc) knee injection x1 (06/07/2021) (100/100/100/75)    Therapeutic  Palliative (PRN) options:   Therapeutic/palliative lumbar facet RFA  Therapeutic/palliative lumbar facet MBB  Therapeutic left L2 TFESI (this level is responsible for her low back pain and left lower extremity pain.)      Recent Visits Date Type Provider Dept  09/28/22 Procedure visit Milinda Pointer, MD [facility]  09/12/22 Office Visit Milinda Pointer, MD [facility]  Showing recent visits within past 90 days and meeting all other requirements Today's Visits Date Type Provider Dept  11/30/22 Procedure visit Milinda Pointer, MD [facility]  Showing today's visits and meeting all other requirements Future Appointments Date Type Provider Dept  01/11/23 Appointment Milinda Pointer, [facility]  02/19/23 Appointment Milinda Pointer, MD [facility]  Showing future appointments within next 90 days and meeting all other requirements  I discussed the assessment and treatment plan with the patient. The patient was provided an opportunity to ask questions and all were answered. The patient agreed with the plan and demonstrated an understanding of the instructions.  Patient advised to call back or seek an in-person evaluation if the symptoms or condition worsens.  Duration of encounter: 30 minutes.  Total time on encounter, as per AMA guidelines included both the face-to-face and non-face-to-face time personally spent by the physician and/or other qualified health care professional(s) on the day of the encounter (includes time in activities that require the physician or other qualified health care professional and does not include time in activities normally performed by clinical staff). Physician's time may include the following activities when performed: preparing  to see the patient (eg, review of tests, pre-charting review of records) obtaining and/or reviewing separately obtained history performing a medically appropriate examination and/or evaluation counseling and educating the patient/family/caregiver ordering medications, tests, or procedures referring and communicating with other health care professionals (when not separately reported) documenting clinical information in the electronic or other health record independently interpreting results (not separately reported) and communicating results to the patient/ family/caregiver care coordination (not separately reported)  Note by: Gaspar Cola, MD Date: 11/30/2022; Time: 11:51 AM

## 2022-12-01 ENCOUNTER — Telehealth: Payer: Self-pay

## 2022-12-01 NOTE — Telephone Encounter (Signed)
Post procedure follow up.  Patient states she is doing good.  

## 2022-12-18 DIAGNOSIS — I129 Hypertensive chronic kidney disease with stage 1 through stage 4 chronic kidney disease, or unspecified chronic kidney disease: Secondary | ICD-10-CM | POA: Insufficient documentation

## 2022-12-18 DIAGNOSIS — F419 Anxiety disorder, unspecified: Secondary | ICD-10-CM | POA: Insufficient documentation

## 2022-12-18 DIAGNOSIS — F32A Depression, unspecified: Secondary | ICD-10-CM | POA: Insufficient documentation

## 2022-12-18 DIAGNOSIS — N1831 Chronic kidney disease, stage 3a: Secondary | ICD-10-CM | POA: Insufficient documentation

## 2022-12-18 DIAGNOSIS — E7800 Pure hypercholesterolemia, unspecified: Secondary | ICD-10-CM | POA: Insufficient documentation

## 2022-12-18 DIAGNOSIS — M810 Age-related osteoporosis without current pathological fracture: Secondary | ICD-10-CM | POA: Insufficient documentation

## 2022-12-25 ENCOUNTER — Telehealth: Payer: Self-pay

## 2022-12-25 NOTE — Telephone Encounter (Signed)
The patient would like to have a knee injection. Can you put in an order

## 2022-12-25 NOTE — Telephone Encounter (Signed)
I dont see that she has had a knee injection before.  She will probably need an appointment.

## 2023-01-04 ENCOUNTER — Other Ambulatory Visit: Payer: Self-pay | Admitting: *Deleted

## 2023-01-04 ENCOUNTER — Encounter: Payer: Self-pay | Admitting: Oncology

## 2023-01-04 MED ORDER — NITROFURANTOIN MONOHYD MACRO 100 MG PO CAPS: 100.0000 mg | ORAL_CAPSULE | Freq: Every day | ORAL | 3 refills | Status: DC

## 2023-01-07 NOTE — Progress Notes (Signed)
(  01/08/2023) appointment rescheduled.  Patient came in 25 minutes late to her appointment.

## 2023-01-08 ENCOUNTER — Ambulatory Visit: Payer: Medicare HMO | Attending: Pain Medicine | Admitting: Pain Medicine

## 2023-01-08 DIAGNOSIS — G8929 Other chronic pain: Secondary | ICD-10-CM

## 2023-01-08 DIAGNOSIS — M545 Low back pain, unspecified: Secondary | ICD-10-CM

## 2023-01-08 DIAGNOSIS — Z91199 Patient's noncompliance with other medical treatment and regimen due to unspecified reason: Secondary | ICD-10-CM

## 2023-01-09 NOTE — Progress Notes (Unsigned)
PROVIDER NOTE: Information contained herein reflects review and annotations entered in association with encounter. Interpretation of such information and data should be left to medically-trained personnel. Information provided to patient can be located elsewhere in the medical record under "Patient Instructions". Document created using STT-dictation technology, any transcriptional errors that may result from process are unintentional.    Patient: Wanda Martinez  Service Category: E/M  Provider: Gaspar Cola, MD  DOB: [birth year]  DOS: 01/10/2023  Referring Provider: Einar Pheasant, MD  MRN: [id number]  Specialty: Interventional Pain Management  PCP: Einar Pheasant, MD  Type: Established Patient  Setting: Ambulatory outpatient    Location: Office  Delivery: Face-to-face     HPI  Ms. Lareina Cathye Kreiter, a 88 y.o. year old female, is here today because of her Chronic pain of right knee [M25.561, G89.29]. Ms. Boultinghouse primary complain today is Back Pain and Knee Pain Last encounter: My last encounter with her was on 01/08/2023. Pertinent problems: Ms. Gabor has Headache; Parkinson's disease; Chronic low back pain (1ry area of Pain) (Bilateral) (R>L) w/o sciatica; Lumbar spondylosis; Chronic hip pain (Bilateral); Chronic neck pain; Cervical spondylosis; Chronic cervical radicular pain (Right); Diffuse myofascial pain syndrome; Neurogenic pain; Chronic upper back pain (Right); Myofascial pain syndrome (Right) (cervicothoracic); Lumbar facet syndrome (Bilateral) (R>L); History of breast cancer; Cervical facet hypertrophy; Cervical facet syndrome ([place]); Chronic shoulder pain (Right); Chronic pain syndrome; Chronic sacroiliac joint pain (Left); Chronic sacroiliac joint pain (Right); 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, L4-5, L5-S1) (Right); Lumbar spinal stenosis (with neurogenic claudication) (L3-4); Chronic lower extremity pain (2ry area of Pain) (Right); Chronic lumbar radicular pain (S1) (Right);  Trochanteric bursitis of hip (Bilateral); Spondylosis without myelopathy or radiculopathy, lumbosacral region; Trigger point with back pain (Right); DDD (degenerative disc disease), lumbosacral; Chronic upper extremity pain (Right); Chronic thoracic back pain (Bilateral) (L>R); Trigger point of thoracic region (Bilateral) (L>R); Other specified dorsopathies, sacral and sacrococcygeal region; Sacroiliac joint dysfunction (Right); Osteoarthritis of sacroiliac joint (Right); Somatic dysfunction of sacroiliac joint (Right); Chronic musculoskeletal pain; Facial pain; Chronic hip pain (Right); Osteoarthritis of hip (Right); Left ear pain; Abnormal MRI, lumbar spine (08/23/2022); Osteoarthritis involving multiple joints; Other spondylosis, sacral and sacrococcygeal region; Lumbar facet hypertrophy (Multilevel) (Bilateral); Chronic knee pain (Right); Osteoarthritis of knee (Right); Trigger point of shoulder region (Right); Osteoarthritis of AC (acromioclavicular) joint (Right); Osteoarthritis of shoulder (Right); Unspecified injury of muscle(s) and tendon(s) of the rotator cuff of shoulder, sequela (Right); Cervicalgia; DDD (degenerative disc disease), lumbar; Lumbar radicular pain (Bilateral); Lumbar foraminal stenosis (Right: L3-4, L4-5, L5-S1) (Left: L2-3); Abnormal MRI, hip (Right) (05/04/2022); Abnormal MRI, knee (01/04/2022); and Tricompartment osteoarthritis of knee (Right) on their pertinent problem list. Pain Assessment: Severity of Chronic pain is reported as a 8 /10. Location: Knee Right, Left, Anterior, Posterior/Denies. Onset: More than a month ago. Quality: Constant, Aching. Timing: Constant. Modifying factor(s): Sitting and keeping weight off feet. Vitals:  height is '5\' 4"'$  (1.626 m) and weight is 185 lb (83.9 kg). Her temporal temperature is 97.3 F (36.3 C) (abnormal). Her blood pressure is 136/64 and her pulse is 51 (abnormal). Her respiration is 16 and oxygen saturation is 100%.  BMI: Estimated body  mass index is 31.76 kg/m as calculated from the following:   Height as of this encounter: '5\' 4"'$  (1.626 m).   Weight as of this encounter: 185 lb (83.9 kg).  Reason for encounter: evaluation of worsening, or previously known (established) problem.  The patient comes into the clinic today indicating a flareup of her right knee  joint pain and right lower back pain.  She recently had a radiofrequency ablation of the lumbar facets.  Provocative hyperextension or rotation maneuver today were negative bilaterally.  Provocative Patrick maneuver was positive on the right side for exact reproduction of the patient's low back pain.  The patient refers that she would like to have her right knee treated for since this is her worst pain and afterwards, she needs to have the right lower back treated.  Previously we had confirmed that the patient has not only bilateral lumbar facet arthropathy but she also has sacroiliac joint pain.  At this time seems to be affecting primarily the right side.  Indicates of her right knee pain she describes the pain as being completely behind the patella suggesting the possibility of a patellofemoral compartment osteoarthritis and pain.  Since the patient has requested that we do the knee injection first, we will go ahead with that today and we will schedule her to come back in 2 weeks for the right sacroiliac joint block.  Pharmacotherapy Assessment  Analgesic: Hydrocodone/APAP 5/325, 1 tab PO q 8 hrs (15 mg/day of hydrocodone) MME/day: 15 mg/day.   Monitoring: [place] PMP: PDMP reviewed during this encounter.       Pharmacotherapy: No side-effects or adverse reactions reported. Compliance: No problems identified. Effectiveness: Clinically acceptable.  Al Decant, RN  01/10/2023  9:57 AM  Sign when Signing Visit Safety precautions to be maintained throughout the outpatient stay will include: orient to surroundings, keep bed in low position, maintain call bell within reach at all  times, provide assistance with transfer out of bed and ambulation.     No results found for: "CBDTHCR" No results found for: "D8THCCBX" No results found for: "D9THCCBX"  UDS:  Summary  Date Value Ref Range Status  06/12/2022 Note  Final    Comment:    ==================================================================== [employer] (MW) ==================================================================== Test                             Result       Flag       Units  Drug Present and Declared for Prescription Verification   Hydrocodone                    1766         EXPECTED   ng/mg creat   Dihydrocodeine                 303          EXPECTED   ng/mg creat   Norhydrocodone                 1597         EXPECTED   ng/mg creat    Sources of hydrocodone include scheduled prescription medications.    Dihydrocodeine and norhydrocodone are expected metabolites of    hydrocodone. Dihydrocodeine is also available as a scheduled    prescription medication.  ==================================================================== Test                      Result    Flag   Units      Ref Range   Creatinine              38               mg/dL      >=20 ==================================================================== Declared Medications:  The flagging and  interpretation on this report are based on the  following declared medications.  Unexpected results may arise from  inaccuracies in the declared medications.   **Note: The testing scope of this panel includes these medications:   Hydrocodone (Norco)   **Note: The testing scope of this panel does not include the  following reported medications:   Acetaminophen (Norco)  Amlodipine  Aspirin  Calcium  Chondroitin  Citalopram (Celexa)  Cyclobenzaprine (Flexeril)  Donepezil (Aricept)  Gabapentin (Neurontin)  Glucosamine  Letrozole (Femara)  Losartan (Cozaar)  Lubiprostone (Amitiza)  Magnesium (Mag-Ox)  Melatonin   Meloxicam (Mobic)  Metoprolol  Multivitamin  Nitrofurantoin (Macrobid)  Omega-3 Fatty Acids  Omeprazole  Pantoprazole (Protonix)  Rosuvastatin (Crestor) ==================================================================== For clinical consultation, please call [phone]. ====================================================================       ROS  Constitutional: Denies any fever or chills Gastrointestinal: No reported hemesis, hematochezia, vomiting, or acute GI distress Musculoskeletal: Denies any acute onset joint swelling, redness, loss of ROM, or weakness Neurological: No reported episodes of acute onset apraxia, aphasia, dysarthria, agnosia, amnesia, paralysis, loss of coordination, or loss of consciousness  Medication Review  Glucosamine Sulfate, Glucosamine-Chondroitin, HYDROcodone-acetaminophen, Omega-3, amLODipine, aspirin EC, calcium carbonate, citalopram, cyclobenzaprine, donepezil, gabapentin, hydrochlorothiazide, letrozole, losartan, lubiprostone, magnesium oxide, melatonin, meloxicam, metoprolol succinate, multivitamin, naloxone, nitrofurantoin (macrocrystal-monohydrate), pantoprazole, and rosuvastatin  History Review  Allergy: Ms. Schirmer is allergic to sulfa antibiotics and vesicare [solifenacin]. Drug: Ms. Danser  reports no history of drug use. Alcohol:  reports no history of alcohol use. Tobacco:  reports that she has never smoked. She has never used smokeless tobacco. Social: Ms. Kenealy  reports that she has never smoked. She has never used smokeless tobacco. She reports that she does not drink alcohol and does not use drugs. Medical:  has a past medical history of Acute postoperative pain (10/25/2017), Anemia, Arm pain (07/26/2015), Arthritis, Arthritis, degenerative (03/26/2014), Back pain (11/01/2013), Bladder infection (06/2018), Breast cancer ([place]), Breast cancer ([place]), CHEST PAIN (04/29/2010), Chronic cystitis, Cystocele (02/22/2013), Cystocele, midline  (08/19/2013), Degeneration of intervertebral disc of lumbosacral region (03/26/2014), DYSPNEA (04/29/2010), Enthesopathy of hip (03/26/2014), GERD (gastroesophageal reflux disease), Hiatal hernia, HTN (hypertension), Hypokalemia (06/2018), Hyponatremia (06/2018), LBP (low back pain) (03/26/2014), Neck pain (11/01/2013), Parkinson disease, Pneumonia (06/2018), Sinusitis (02/07/2015), Skin lesions (07/12/2014), and Urinary incontinence. Surgical: Ms. Ignasiak  has a past surgical history that includes Tonsillectomy and adenoidectomy (81); Vesicovaginal fistula closure w/ TAH (1983); Breast surgery (1986); Breast enhancement surgery (1987); Breast implant removal; Breast implant removal (Right, 08/29/2012); Mastectomy (08/2012); Abdominal hysterectomy; Colonoscopy with propofol (N/A, 09/13/2016); and Breast biopsy (2013). Family: family history includes Colon polyps in her father; Diabetes in her father; Parkinson's disease in her mother; Stroke in her father and mother.  Laboratory Chemistry Profile   Renal Lab Results  Component Value Date   BUN 22 09/26/2022   CREATININE 1.40 (H) 09/26/2022   BCR 20 06/11/2020   GFR 34.44 (L) 09/26/2022   GFRAA >60 07/27/2020   GFRNONAA 47 (L) 05/09/2022    Hepatic Lab Results  Component Value Date   AST 22 09/26/2022   ALT 16 09/26/2022   ALBUMIN 4.0 09/26/2022   ALKPHOS 34 (L) 09/26/2022    Electrolytes Lab Results  Component Value Date   NA 131 (L) 10/02/2022   K 3.8 09/26/2022   CL 97 09/26/2022   CALCIUM 9.5 09/26/2022   MG 2.2 07/17/2018    Bone Lab Results  Component Value Date   VD25OH 66.95 09/26/2022   25OHVITD1 42 07/17/2018   25OHVITD2 <1.0 07/17/2018   25OHVITD3  42 07/17/2018    Inflammation (CRP: Acute Phase) (ESR: Chronic Phase) Lab Results  Component Value Date   CRP 3 07/17/2018   ESRSEDRATE 27 07/17/2018         Note: Above Lab results reviewed.  Recent Imaging Review  DG PAIN CLINIC C-ARM 1-60 MIN NO REPORT Fluoro was used, but  no Radiologist interpretation will be provided.  Please refer to "NOTES" tab for provider progress note. Note: Reviewed        Physical Exam  General appearance: Well nourished, well developed, and well hydrated. In no apparent acute distress Mental status: Alert, oriented x 3 (person, place, & time)       Respiratory: No evidence of acute respiratory distress Eyes: PERLA Vitals: BP 136/64   Pulse (!) 51   Temp (!) 97.3 F (36.3 C) (Temporal)   Resp 16   Ht '5\' 4"'$  (1.626 m)   Wt 185 lb (83.9 kg)   LMP 12/18/1981   SpO2 100%   BMI 31.76 kg/m  BMI: Estimated body mass index is 31.76 kg/m as calculated from the following:   Height as of this encounter: '5\' 4"'$  (1.626 m).   Weight as of this encounter: 185 lb (83.9 kg). Ideal: Ideal body weight: 54.7 kg (120 lb 9.5 oz) Adjusted ideal body weight: 66.4 kg (146 lb 5.7 oz)  Assessment   Diagnosis Status  1. Chronic knee pain (Right)   2. Osteoarthritis of knee (Right)   3. Abnormal MRI, knee (01/04/2022)   4. Chronic low back pain (1ry area of Pain) (Bilateral) (R>L) w/o sciatica   5. DDD (degenerative disc disease), lumbar   6. Chronic sacroiliac joint pain (Right)   7. Abnormal MRI, lumbar spine (08/23/2022)    Controlled Controlled Controlled   Updated Problems: No problems updated.  Plan of Care  Problem-specific:  No problem-specific Assessment & Plan notes found for this encounter.  Ms. Lema Heinkel has a current medication list which includes the following long-term medication(s): amlodipine, calcium carbonate, citalopram, cyclobenzaprine, donepezil, gabapentin, hydrochlorothiazide, hydrocodone-acetaminophen, [START ON 01/29/2023] hydrocodone-acetaminophen, losartan, magnesium oxide, meloxicam, metoprolol succinate, pantoprazole, rosuvastatin, and hydrocodone-acetaminophen.  Pharmacotherapy (Medications Ordered): Meds ordered this encounter  Medications   pentafluoroprop-tetrafluoroeth (GEBAUERS) aerosol    lidocaine (PF) (XYLOCAINE) 1 % injection 5 mL   ropivacaine (PF) 2 mg/mL (0.2%) (NAROPIN) injection 5 mL   methylPREDNISolone acetate (DEPO-MEDROL) injection 80 mg   Orders:  Orders Placed This Encounter  Procedures   KNEE INJECTION    Local Anesthetic & Steroid injection.    Scheduling Instructions:     Side(s): Right Knee     Sedation: None     Timeframe: Today    Order Specific Question:   Where will this procedure be performed?    Answer:   [facility]   SACROILIAC JOINT INJECTION    Standing Status:   Future    Standing Expiration Date:   04/11/2023    Scheduling Instructions:     Side: Right-sided     Sedation: Patient's choice.     Timeframe: ASAP (2wks)    Order Specific Question:   Where will this procedure be performed?    Answer:   [facility]   Informed Consent Details: Physician/Practitioner Attestation; Transcribe to consent form and obtain patient signature    Note: Always confirm laterality of pain with Ms. Ermalinda Barrios, before procedure. Transcribe to consent form and obtain patient signature.    Order Specific Question:   Physician/Practitioner attestation of informed consent for procedure/surgical  case    Answer:   I, the physician/practitioner, attest that I have discussed with the patient the benefits, risks, side effects, alternatives, likelihood of achieving goals and potential problems during recovery for the procedure that I have provided informed consent.    Order Specific Question:   Procedure    Answer:   Right-sided intra-articular knee arthrocentesis (aspiration and/or injection)    Order Specific Question:   Physician/Practitioner performing the procedure    Answer:   Louna Rothgeb A. Dossie Arbour, MD    Order Specific Question:   Indication/Reason    Answer:   Chronic right-sided knee pain secondary to knee arthropathy/arthralgia   Provide equipment / supplies at bedside    Procedure tray: "Block Tray" (Disposable  single use) Skin infiltration  needle: Regular 1.5-in, 25-G, (x1) Block Needle type: Regular Amount/quantity: 1 Size: Short(1.5-inch) Gauge: (25G x1) + (22G x1)    Standing Status:   Standing    Number of Occurrences:   1    Order Specific Question:   Specify    Answer:   Block Tray   Follow-up plan:   Return in about 2 weeks (around 01/24/2023) for (Clinic): (R) SI BLK #4, (PPE).     Interventional Therapies  Risk  Complexity Considerations:   Estimated body mass index is 30.73 kg/m as calculated from the following:   Height as of this encounter: '5\' 4"'$  (1.626 m).   Weight as of this encounter: 179 lb (81.2 kg). WNL   Planned  Pending:   Therapeutic left lumbar facet (L4-S1) RFA #2    Under consideration:   Diagnostic right genicular NB  Diagnostic bilateral cervical facet MBB  Therapeutic right L3 and L4 TFESI #1    Completed:   Therapeutic right superior/lateral trapezius muscle TPI x1 (04/19/2021)  Therapeutic right thoracic back TPI x2 (10/17/2018) (DKFUA)  Therapeutic left thoracic back TPI x1 (10/17/2018) (DKFUA)  Therapeutic right quadratus lumborum and erector spinae muscle TPI x1 (02/12/2018)  Therapeutic bilateral lumbar facet MBB (L2-S1) (FCT: L3-4,L4-5,L5-S1) x5 (08/09/2021) (100/100/50)  Therapeutic bilateral lumbar facet MBB (L4-S1) (FCT: L5-S1) x1 (08/29/2022) (100/100/100 x3 days/R:0L:100)  Therapeutic right lumbar facet MBB (L2-S1) x4 (12/21/2020) (DKFUA)  Therapeutic right lumbar facet RFA x3 (09/28/2022) ( Therapeutic left lumbar facet RFA x1 (02/12/2018) (100/60/90/>75)  Diagnostic right SI joint Blk x3 (12/21/2020)  (DKFUA)  Diagnostic left SI joint Blk x1 (08/09/2017) (100/90/20/<25)  Therapeutic right SI joint RFA x3 (06/05/2019) (100/100/85/>50)  Therapeutic midline L2-3 LESI x1 (10/04/2017) (100/100/85/>50)  Therapeutic right L3-4 LESI x2 (11/04/2020) (100/100/20/<50)  Therapeutic right L4-5 LESI x2 (03/30/2022) (100/100/75/75)  Therapeutic right L5-S1 LESI x2 (12/04/2019)  (100/100/50/75)  Diagnostic/therapeutic right L3 and L4 TFESI x1 (07/27/2022) (100/100/75/50)  Therapeutic right L5 TFESI x4 (02/14/2022) (100/100/70/75)  Therapeutic right S1 TFESI x1 (11/04/2020) (100/100/20/<50)  Diagnostic bilateral superficial trochanteric bursa injec. x1 (04/30/2018) (DKFUA)  Diagnostic right IA hip injection x1 (02/13/2019) (DKFUA)  Diagnostic/therapeutic right IA steroid knee inj. x3 (08/15/2022) (100/100/100/80)  Therapeutic right ([place]) knee injection x1 (10/06/2021)  (DKFUA)  Therapeutic right (Monovisc) knee injection x1 (06/07/2021) (100/100/100/75)    Therapeutic  Palliative (PRN) options:   Therapeutic/palliative lumbar facet RFA  Therapeutic/palliative lumbar facet MBB  Therapeutic left L2 TFESI (this level is responsible for her low back pain and left lower extremity pain.)   Pharmacotherapy:  Nonopioids transfer 12/21/2020: Gabapentin Recommendations:   None at this time.      Recent Visits Date Type Provider Dept  01/08/23 Office Visit Milinda Pointer, [facility]  11/30/22 Procedure  visit Milinda Pointer, MD [facility]  Showing recent visits within past 90 days and meeting all other requirements Today's Visits Date Type Provider Dept  01/10/23 Office Visit Milinda Pointer, MD [facility]  Showing today's visits and meeting all other requirements Future Appointments Date Type Provider Dept  02/19/23 Appointment Milinda Pointer, MD [facility]  Showing future appointments within next 90 days and meeting all other requirements  I discussed the assessment and treatment plan with the patient. The patient was provided an opportunity to ask questions and all were answered. The patient agreed with the plan and demonstrated an understanding of the instructions.  Patient advised to call back or seek an in-person evaluation if the symptoms or condition worsens.  Duration of encounter: 30 minutes.   Total time on encounter, as per AMA guidelines included both the face-to-face and non-face-to-face time personally spent by the physician and/or other qualified health care professional(s) on the day of the encounter (includes time in activities that require the physician or other qualified health care professional and does not include time in activities normally performed by clinical staff). Physician's time may include the following activities when performed: Preparing to see the patient (e.g., pre-charting review of records, searching for previously ordered imaging, lab work, and nerve conduction tests) Review of prior analgesic pharmacotherapies. Reviewing PMP Interpreting ordered tests (e.g., lab work, imaging, nerve conduction tests) Performing post-procedure evaluations, including interpretation of diagnostic procedures Obtaining and/or reviewing separately obtained history Performing a medically appropriate examination and/or evaluation Counseling and educating the patient/family/caregiver Ordering medications, tests, or procedures Referring and communicating with other health care professionals (when not separately reported) Documenting clinical information in the electronic or other health record Independently interpreting results (not separately reported) and communicating results to the patient/ family/caregiver Care coordination (not separately reported)  Note by: Gaspar Cola, MD Date: 01/10/2023; Time: 12:47 PM

## 2023-01-10 ENCOUNTER — Encounter: Payer: Self-pay | Admitting: Pain Medicine

## 2023-01-10 ENCOUNTER — Ambulatory Visit: Payer: Medicare HMO | Attending: Pain Medicine | Admitting: Pain Medicine

## 2023-01-10 VITALS — BP 136/64 | HR 51 | Temp 97.3°F | Resp 16 | Ht 64.0 in | Wt 185.0 lb

## 2023-01-10 DIAGNOSIS — M48061 Spinal stenosis, lumbar region without neurogenic claudication: Secondary | ICD-10-CM

## 2023-01-10 DIAGNOSIS — M25561 Pain in right knee: Secondary | ICD-10-CM | POA: Insufficient documentation

## 2023-01-10 DIAGNOSIS — M545 Low back pain, unspecified: Secondary | ICD-10-CM | POA: Insufficient documentation

## 2023-01-10 DIAGNOSIS — R937 Abnormal findings on diagnostic imaging of other parts of musculoskeletal system: Secondary | ICD-10-CM | POA: Diagnosis not present

## 2023-01-10 DIAGNOSIS — M1711 Unilateral primary osteoarthritis, right knee: Secondary | ICD-10-CM | POA: Diagnosis not present

## 2023-01-10 DIAGNOSIS — M48062 Spinal stenosis, lumbar region with neurogenic claudication: Secondary | ICD-10-CM

## 2023-01-10 DIAGNOSIS — R936 Abnormal findings on diagnostic imaging of limbs: Secondary | ICD-10-CM | POA: Insufficient documentation

## 2023-01-10 DIAGNOSIS — M5136 Other intervertebral disc degeneration, lumbar region: Secondary | ICD-10-CM | POA: Diagnosis not present

## 2023-01-10 DIAGNOSIS — M533 Sacrococcygeal disorders, not elsewhere classified: Secondary | ICD-10-CM | POA: Diagnosis not present

## 2023-01-10 DIAGNOSIS — M4807 Spinal stenosis, lumbosacral region: Secondary | ICD-10-CM

## 2023-01-10 DIAGNOSIS — G8929 Other chronic pain: Secondary | ICD-10-CM | POA: Insufficient documentation

## 2023-01-10 MED ORDER — LIDOCAINE HCL (PF) 1 % IJ SOLN
5.0000 mL | Freq: Once | INTRAMUSCULAR | Status: AC
  Administered 2023-01-10: 5 mL
  Filled 2023-01-10: qty 5

## 2023-01-10 MED ORDER — METHYLPREDNISOLONE ACETATE 80 MG/ML IJ SUSP
80.0000 mg | Freq: Once | INTRAMUSCULAR | Status: AC
  Administered 2023-01-10: 80 mg via INTRA_ARTICULAR
  Filled 2023-01-10: qty 1

## 2023-01-10 MED ORDER — PENTAFLUOROPROP-TETRAFLUOROETH EX AERO
INHALATION_SPRAY | Freq: Once | CUTANEOUS | Status: AC
  Administered 2023-01-10: 30 via TOPICAL

## 2023-01-10 MED ORDER — ROPIVACAINE HCL 2 MG/ML IJ SOLN
5.0000 mL | Freq: Once | INTRAMUSCULAR | Status: AC
  Administered 2023-01-10: 5 mL via INTRA_ARTICULAR
  Filled 2023-01-10: qty 20

## 2023-01-10 NOTE — Progress Notes (Signed)
Safety precautions to be maintained throughout the outpatient stay will include: orient to surroundings, keep bed in low position, maintain call bell within reach at all times, provide assistance with transfer out of bed and ambulation.  

## 2023-01-10 NOTE — Patient Instructions (Addendum)
Post-procedure Information What to expect: Most procedures involve the use of a local anesthetic (numbing medicine), and a steroid (anti-inflammatory medicine).  The local anesthetics may cause temporary numbness and weakness of the legs or arms, depending on the location of the block. This numbness/weakness may last 4-6 hours, depending on the local anesthetic used. In rare instances, it can last up to 24 hours. While numb, you must be very careful not to injure the extremity.  After any procedure, you could expect the pain to get better within 15-20 minutes. This relief is temporary and may last 4-6 hours. Once the local anesthetics wears off, you could experience discomfort, possibly more than usual, for up to 10 (ten) days. In the case of radiofrequencies, it may last up to 6 weeks. Surgeries may take up to 8 weeks for the healing process. The discomfort is due to the irritation caused by needles going through skin and muscle. To minimize the discomfort, we recommend using ice the first day, and heat from then on. The ice should be applied for 15 minutes on, and 15 minutes off. Keep repeating this cycle until bedtime. Avoid applying the ice directly to the skin, to prevent frostbite. Heat should be used daily, until the pain improves (4-10 days). Be careful not to burn yourself.  Occasionally you may experience muscle spasms or cramps. These occur as a consequence of the irritation caused by the needle sticks to the muscle and the blood that will inevitably be lost into the surrounding muscle tissue. Blood tends to be very irritating to tissues, which tend to react by going into spasm. These spasms may start the same day of your procedure, but they may also take days to develop. This late onset type of spasm or cramp is usually caused by electrolyte imbalances triggered by the steroids, at the level of the kidney. Cramps and spasms tend to respond well to muscle relaxants, multivitamins (some are  triggered by the procedure, but may have their origins in vitamin deficiencies), and "Gatorade", or any sports drinks that can replenish any electrolyte imbalances. (If you are a diabetic, ask your pharmacist to get you a sugar-free brand.) Warm showers or baths may also be helpful. Stretching exercises are highly recommended. General Instructions:  Be alert for signs of possible infection: redness, swelling, heat, red streaks, elevated temperature, and/or fever. These typically appear 4 to 6 days after the procedure. Immediately notify your doctor if you experience unusual bleeding, difficulty breathing, or loss of bowel or bladder control. If you experience increased pain, do not increase your pain medicine intake, unless instructed by your pain physician. Post-Procedure Care:  Be careful in moving about. Muscle spasms in the area of the injection may occur. Applying ice or heat to the area is often helpful. The incidence of spinal headaches after epidural injections ranges between 1.4% and 6%. If you develop a headache that does not seem to respond to conservative therapy, please let your physician know. This can be treated with an epidural blood patch.   Post-procedure numbness or redness is to be expected, however it should average 4 to 6 hours. If numbness and weakness of your extremities begins to develop 4 to 6 hours after your procedure, and is felt to be progressing and worsening, immediately contact your physician.   Diet:  If you experience nausea, do not eat until this sensation goes away. If you had a "Stellate Ganglion Block" for upper extremity "Reflex Sympathetic Dystrophy", do not eat or drink until your  hoarseness goes away. In any case, always start with liquids first and if you tolerate them well, then slowly progress to more solid foods. Activity:  For the first 4 to 6 hours after the procedure, use caution in moving about as you may experience numbness and/or weakness. Use caution in  cooking, using household electrical appliances, and climbing steps. If you need to reach your Doctor call our office: [phone] Monday-Thursday 8:00 am - 4:00 PM    Fridays: Closed     In case of an emergency: In case of emergency, call 911 or go to the nearest emergency room and have the physician there call us.  Interpretation of Procedure Every nerve block has two components: a diagnostic component, and a treatment component. Unrealistic expectations are the most common causes of "perceived failure".  In a perfect world, a single nerve block should be able to completely and permanently eliminate the pain. Sadly, the world is not perfect.  Most pain management nerve blocks are performed using local anesthetics and steroids. Steroids are responsible for any long-term benefit that you may experience. Their purpose is to decrease any chronic swelling that may exist in the area. Steroids begin to work immediately after being injected. However, most patients will not experience any benefits until 5 to 10 days after the injection, when the swelling has come down to the point where they can tell a difference. Steroids will only help if there is swelling to be treated. As such, they can assist with the diagnosis. If effective, they suggest an inflammatory component to the pain, and if ineffective, they rule out inflammation as the main cause or component of the problem. If the problem is one of mechanical compression, you will get no benefit from those steroids.   In the case of local anesthetics, they have a crucial role in the diagnosis of your condition. Most will begin to work within15 to 20 minutes after injection. The duration will depend on the type used (short- vs. Long-acting). It is of outmost importance that patients keep tract of their pain, after the procedure. To assist with this matter, a "Post-procedure Pain Diary" is provided. Make sure to complete it and to bring it back to your  follow-up appointment.  As long as the patient keeps accurate, detailed records of their symptoms after every procedure, and returns to have those interpreted, every procedure will provide [place] with invaluable information. Even a block that does not provide the patient with any relief, will always provide [place] with information about the mechanism and the origin of the pain. The only time a nerve block can be considered a waste of time is when patients do not keep track of the results, or do not keep their post-procedure appointment.  Reporting the results back to your physician The Pain Score  Pain is a subjective complaint. It cannot be seen, touched, or measured. We depend entirely on the patient's report of the pain in order to assess your condition and treatment. To evaluate the pain, we use a pain scale, where "0" means "No Pain", and a "10" is "the worst possible pain that you can even imagine" (i.e. something like been eaten alive by a shark or being torn apart by a lion).   You will frequently be asked to rate your pain. Please be as accurate, remember that medical decisions will be based on your responses. Please do not rate your pain above a 10. Doing so is actually interpreted as "symptom magnification" (exaggeration), as  well as lack of understanding with regards to the scale. To put this into perspective, when you tell us that your pain is at a 10 (ten), what you are saying is that there is nothing we can do to make this pain any worse. (Carefully think about that.)  ______________________________________________________________________  Procedure instructions  Do not eat or drink fluids (other than water) for 6 hours before your procedure  No water for 2 hours before your procedure  Take your blood pressure medicine with a sip of water  Arrive 30 minutes before your appointment  Carefully read the "Preparing for your procedure" detailed instructions  If you have questions call us at  [phone]  _____________________________________________________________________    ______________________________________________________________________  Preparing for your procedure  During your procedure appointment there will be: No Prescription Refills. No disability issues to discussed. No medication changes or discussions.  Instructions: Food intake: Avoid eating anything solid for at least 8 hours prior to your procedure. Clear liquid intake: You may take clear liquids such as water up to 2 hours prior to your procedure. (No carbonated drinks. No soda.) Transportation: Unless otherwise stated by your physician, bring a driver. Morning Medicines: Except for blood thinners, take all of your other morning medications with a sip of water. Make sure to take your heart and blood pressure medicines. If your blood pressure's lower number is above 100, the case will be rescheduled. Blood thinners: Make sure to stop your blood thinners as instructed.  If you take a blood thinner, but were not instructed to stop it, call our office [phone] and ask to talk to a nurse. Not stopping a blood thinner prior to certain procedures could lead to serious complications. Diabetics on insulin: Notify the staff so that you can be scheduled 1st case in the morning. If your diabetes requires high dose insulin, take only  of your normal insulin dose the morning of the procedure and notify the staff that you have done so. Preventing infections: Shower with an antibacterial soap the morning of your procedure.  Build-up your immune system: Take 1000 mg of Vitamin C with every meal (3 times a day) the day prior to your procedure. Antibiotics: Inform the nursing staff if you are taking any antibiotics or if you have any conditions that may require antibiotics prior to procedures. (Example: recent joint implants)   Pregnancy: If you are pregnant make sure to notify the nursing staff. Not doing so may  result in injury to the fetus, including death.  Sickness: If you have a cold, fever, or any active infections, call and cancel or reschedule your procedure. Receiving steroids while having an infection may result in complications. Arrival: You must be in the facility at least 30 minutes prior to your scheduled procedure. Tardiness: Your scheduled time is also the cutoff time. If you do not arrive at least 15 minutes prior to your procedure, you will be rescheduled.  Children: Do not bring any children with you. Make arrangements to keep them home. Dress appropriately: There is always a possibility that your clothing may get soiled. Avoid long dresses. Valuables: Do not bring any jewelry or valuables.  Reasons to call and reschedule or cancel your procedure: (Following these recommendations will minimize the risk of a serious complication.) Surgeries: Avoid having procedures within 2 weeks of any surgery. (Avoid for 2 weeks before or after any surgery). Flu Shots: Avoid having procedures within 2 weeks of a flu shots or . (Avoid for 2 weeks before  or after immunizations). Barium: Avoid having a procedure within 7-10 days after having had a radiological study involving the use of radiological contrast. (Myelograms, Barium swallow or enema study). Heart attacks: Avoid any elective procedures or surgeries for the initial 6 months after a "Myocardial Infarction" (Heart Attack). Blood thinners: It is imperative that you stop these medications before procedures. Let us know if you if you take any blood thinner.  Infection: Avoid procedures during or within two weeks of an infection (including chest colds or gastrointestinal problems). Symptoms associated with infections include: Localized redness, fever, chills, night sweats or profuse sweating, burning sensation when voiding, cough, congestion, stuffiness, runny nose, sore throat, diarrhea, nausea, vomiting, cold or Flu symptoms, recent or current  infections. It is specially important if the infection is over the area that we intend to treat. Heart and lung problems: Symptoms that may suggest an active cardiopulmonary problem include: cough, chest pain, breathing difficulties or shortness of breath, dizziness, ankle swelling, uncontrolled high or unusually low blood pressure, and/or palpitations. If you are experiencing any of these symptoms, cancel your procedure and contact your primary care physician for an evaluation.  Remember:  Regular Business hours are:  Monday to Thursday 8:00 AM to 4:00 PM  Provider's Schedule: Milinda Pointer, MD:  Procedure days: Tuesday and Thursday 7:30 AM to 4:00 PM  Gillis Santa, MD:  Procedure days: Monday and Wednesday 7:30 AM to 4:00 PM  ______________________________________________________________________    ____________________________________________________________________________________________  General Risks and Possible Complications  Patient Responsibilities: It is important that you read this as it is part of your informed consent. It is our duty to inform you of the risks and possible complications associated with treatments offered to you. It is your responsibility as a patient to read this and to ask questions about anything that is not clear or that you believe was not covered in this document.  Patient's Rights: You have the right to refuse treatment. You also have the right to change your mind, even after initially having agreed to have the treatment done. However, under this last option, if you wait until the last second to change your mind, you may be charged for the materials used up to that point.  Introduction: Medicine is not an Chief Strategy Officer. Everything in Medicine, including the lack of treatment(s), carries the potential for danger, harm, or loss (which is by definition: Risk). In Medicine, a complication is a secondary problem, condition, or disease that can aggravate an  already existing one. All treatments carry the risk of possible complications. The fact that a side effects or complications occurs, does not imply that the treatment was conducted incorrectly. It must be clearly understood that these can happen even when everything is done following the highest safety standards.  No treatment: You can choose not to proceed with the proposed treatment alternative. The "PRO(s)" would include: avoiding the risk of complications associated with the therapy. The "CON(s)" would include: not getting any of the treatment benefits. These benefits fall under one of three categories: diagnostic; therapeutic; and/or palliative. Diagnostic benefits include: getting information which can ultimately lead to improvement of the disease or symptom(s). Therapeutic benefits are those associated with the successful treatment of the disease. Finally, palliative benefits are those related to the decrease of the primary symptoms, without necessarily curing the condition (example: decreasing the pain from a flare-up of a chronic condition, such as incurable terminal cancer).  General Risks and Complications: These are associated to most interventional treatments. They can occur  alone, or in combination. They fall under one of the following six (6) categories: no benefit or worsening of symptoms; bleeding; infection; nerve damage; allergic reactions; and/or death. No benefits or worsening of symptoms: In Medicine there are no guarantees, only probabilities. No healthcare provider can ever guarantee that a medical treatment will work, they can only state the probability that it may. Furthermore, there is always the possibility that the condition may worsen, either directly, or indirectly, as a consequence of the treatment. Bleeding: This is more common if the patient is taking a blood thinner, either prescription or over the counter (example: Goody Powders, Fish oil, Aspirin, Garlic, etc.), or if  suffering a condition associated with impaired coagulation (example: Hemophilia, cirrhosis of the liver, low platelet counts, etc.). However, even if you do not have one on these, it can still happen. If you have any of these conditions, or take one of these drugs, make sure to notify your treating physician. Infection: This is more common in patients with a compromised immune system, either due to disease (example: diabetes, cancer, human immunodeficiency virus [HIV], etc.), or due to medications or treatments (example: therapies used to treat cancer and rheumatological diseases). However, even if you do not have one on these, it can still happen. If you have any of these conditions, or take one of these drugs, make sure to notify your treating physician. Nerve Damage: This is more common when the treatment is an invasive one, but it can also happen with the use of medications, such as those used in the treatment of cancer. The damage can occur to small secondary nerves, or to large primary ones, such as those in the spinal cord and brain. This damage may be temporary or permanent and it may lead to impairments that can range from temporary numbness to permanent paralysis and/or brain death. Allergic Reactions: Any time a substance or material comes in contact with our body, there is the possibility of an allergic reaction. These can range from a mild skin rash (contact dermatitis) to a severe systemic reaction (anaphylactic reaction), which can result in death. Death: In general, any medical intervention can result in death, most of the time due to an unforeseen complication. ____________________________________________________________________________________________    ____________________________________________________________________________________________  Post-Procedure Discharge Instructions  Instructions: Apply ice:  Purpose: This will minimize any swelling and discomfort after procedure.   When: Day of procedure, as soon as you get home. How: Fill a plastic sandwich bag with crushed ice. Cover it with a small towel and apply to injection site. How long: (15 min on, 15 min off) Apply for 15 minutes then remove x 15 minutes.  Repeat sequence on day of procedure, until you go to bed. Apply heat:  Purpose: To treat any soreness and discomfort from the procedure. When: Starting the next day after the procedure. How: Apply heat to procedure site starting the day following the procedure. How long: May continue to repeat daily, until discomfort goes away. Food intake: Start with clear liquids (like water) and advance to regular food, as tolerated.  Physical activities: Keep activities to a minimum for the first 8 hours after the procedure. After that, then as tolerated. Driving: If you have received any sedation, be responsible and do not drive. You are not allowed to drive for 24 hours after having sedation. Blood thinner: (Applies only to those taking blood thinners) You may restart your blood thinner 6 hours after your procedure. Insulin: (Applies only to Diabetic patients taking insulin) As soon  as you can eat, you may resume your normal dosing schedule. Infection prevention: Keep procedure site clean and dry. Shower daily and clean area with soap and water. Post-procedure Pain Diary: Extremely important that this be done correctly and accurately. Recorded information will be used to determine the next step in treatment. For the purpose of accuracy, follow these rules: Evaluate only the area treated. Do not report or include pain from an untreated area. For the purpose of this evaluation, ignore all other areas of pain, except for the treated area. After your procedure, avoid taking a long nap and attempting to complete the pain diary after you wake up. Instead, set your alarm clock to go off every hour, on the hour, for the initial 8 hours after the procedure. Document the duration of  the numbing medicine, and the relief you are getting from it. Do not go to sleep and attempt to complete it later. It will not be accurate. If you received sedation, it is likely that you were given a medication that may cause amnesia. Because of this, completing the diary at a later time may cause the information to be inaccurate. This information is needed to plan your care. Follow-up appointment: Keep your post-procedure follow-up evaluation appointment after the procedure (usually 2 weeks for most procedures, 6 weeks for radiofrequencies). DO NOT FORGET to bring you pain diary with you.   Expect: (What should I expect to see with my procedure?) From numbing medicine (AKA: Local Anesthetics): Numbness or decrease in pain. You may also experience some weakness, which if present, could last for the duration of the local anesthetic. Onset: Full effect within 15 minutes of injected. Duration: It will depend on the type of local anesthetic used. On the average, 1 to 8 hours.  From steroids (Applies only if steroids were used): Decrease in swelling or inflammation. Once inflammation is improved, relief of the pain will follow. Onset of benefits: Depends on the amount of swelling present. The more swelling, the longer it will take for the benefits to be seen. In some cases, up to 10 days. Duration: Steroids will stay in the system x 2 weeks. Duration of benefits will depend on multiple posibilities including persistent irritating factors. Side-effects: If present, they may typically last 2 weeks (the duration of the steroids). Frequent: Cramps (if they occur, drink Gatorade and take over-the-counter Magnesium 450-500 mg once to twice a day); water retention with temporary weight gain; increases in blood sugar; decreased immune system response; increased appetite. Occasional: Facial flushing (red, warm cheeks); mood swings; menstrual changes. Uncommon: Long-term decrease or suppression of natural hormones;  bone thinning. (These are more common with higher doses or more frequent use. This is why we prefer that our patients avoid having any injection therapies in other practices.)  Very Rare: Severe mood changes; psychosis; aseptic necrosis. From procedure: Some discomfort is to be expected once the numbing medicine wears off. This should be minimal if ice and heat are applied as instructed.  Call if: (When should I call?) You experience numbness and weakness that gets worse with time, as opposed to wearing off. New onset bowel or bladder incontinence. (Applies only to procedures done in the spine)  Emergency Numbers: Durning business hours (Monday - Thursday, 8:00 AM - 4:00 PM) (Friday, 9:00 AM - 12:00 Noon): [phone] After hours: [phone] NOTE: If you are having a problem and are unable connect with, or to talk to a provider, then go to your nearest urgent care or  emergency department. If the problem is serious and urgent, please call 911. ____________________________________________________________________________________________

## 2023-01-10 NOTE — Progress Notes (Signed)
PROVIDER NOTE: Interpretation of information contained herein should be left to medically-trained personnel. Specific patient instructions are provided elsewhere under "Patient Instructions" section of medical record. This document was created in part using STT-dictation technology, any transcriptional errors that may result from this process are unintentional.  Patient: Wanda Martinez Type: Established DOB: [birth year] MRN: [id number] PCP: Einar Pheasant, MD  Service: Procedure DOS: 01/10/2023 Setting: Ambulatory Location: Ambulatory outpatient facility Delivery: Face-to-face Provider: Gaspar Cola, MD Specialty: Interventional Pain Management Specialty designation: 09 Location: Outpatient facility Ref. Prov.: Einar Pheasant, MD    Primary Reason for Visit: Interventional Pain Management Treatment. CC: Back Pain and Knee Pain   Procedure:           Type: Steroid Intra-articular Knee Injection #4  Laterality: Right (-RT) Level/approach: Lateral Imaging guidance: None required ([id number]) Anesthesia: Local anesthesia (1-2% Lidocaine) Anxiolysis: None                 Sedation: No Sedation                       DOS: 01/10/2023  Performed by: Gaspar Cola, MD  Purpose: Diagnostic/Therapeutic Indications: Knee arthralgia associated to osteoarthritis of the knee 1. Chronic knee pain (Right)   2. Osteoarthritis of knee (Right)   3. Abnormal MRI, knee (01/04/2022)    NAS-11 score:   Pre-procedure: 8 /10   Post-procedure: 0-No pain/10     Pre-Procedure Preparation  Monitoring: As per clinic protocol.  Risk Assessment: Vitals:  [id number] body mass index is 31.76 kg/m as calculated from the following:   Height as of this encounter: '5\' 4"'$  (1.626 m).   Weight as of this encounter: 185 lb (83.9 kg)., Rate:(!) 55 , BP:(!) 125/57, Resp:18, Temp:(!) 97.3 F (36.3 C), SpO2:100 %  Allergies: She is allergic to sulfa antibiotics and vesicare [solifenacin].   Precautions: No additional precautions required  Blood-thinner(s): None at this time  Coagulopathies: Reviewed. None identified.   Active Infection(s): Reviewed. None identified. Wanda Martinez is afebrile   Location setting: Exam room Position: Sitting w/ knee bent 90 degrees Safety Precautions: Patient was assessed for positional comfort and pressure points before starting the procedure. Prepping solution: DuraPrep (Iodine Povacrylex [0.7% available iodine] and Isopropyl Alcohol, 74% w/w) Prep Area: Entire knee region Approach: percutaneous, just above the tibial plateau, lateral to the infrapatellar tendon. Intended target: Intra-articular knee space Materials: Tray: Block Needle(s): Regular Qty: 1/side Length: 1.5-inch Gauge: 25G   Meds ordered this encounter  Medications   pentafluoroprop-tetrafluoroeth (GEBAUERS) aerosol   lidocaine (PF) (XYLOCAINE) 1 % injection 5 mL   ropivacaine (PF) 2 mg/mL (0.2%) (NAROPIN) injection 5 mL   methylPREDNISolone acetate (DEPO-MEDROL) injection 80 mg    Orders Placed This Encounter  Procedures   KNEE INJECTION    Local Anesthetic & Steroid injection.    Scheduling Instructions:     Side(s): Right Knee     Sedation: None     Timeframe: Today    Order Specific Question:   Where will this procedure be performed?    Answer:   [facility]   SACROILIAC JOINT INJECTION    Standing Status:   Future    Standing Expiration Date:   04/11/2023    Scheduling Instructions:     Side: Right-sided     Sedation: Patient's choice.     Timeframe: ASAP (2wks)    Order Specific Question:   Where will this procedure be performed?    Answer:   [facility]  [facility] Management   Informed Consent Details: Physician/Practitioner Attestation; Transcribe to consent form and obtain patient signature    Note: Always confirm laterality of pain with Wanda Martinez, before procedure. Transcribe to consent form and obtain patient signature.    Order Specific Question:    Physician/Practitioner attestation of informed consent for procedure/surgical case    Answer:   I, the physician/practitioner, attest that I have discussed with the patient the benefits, risks, side effects, alternatives, likelihood of achieving goals and potential problems during recovery for the procedure that I have provided informed consent.    Order Specific Question:   Procedure    Answer:   Right-sided intra-articular knee arthrocentesis (aspiration and/or injection)    Order Specific Question:   Physician/Practitioner performing the procedure    Answer:   Izic Stfort A. Dossie Arbour, MD    Order Specific Question:   Indication/Reason    Answer:   Chronic right-sided knee pain secondary to knee arthropathy/arthralgia   Provide equipment / supplies at bedside    Procedure tray: "Block Tray" (Disposable  single use) Skin infiltration needle: Regular 1.5-in, 25-G, (x1) Block Needle type: Regular Amount/quantity: 1 Size: Short(1.5-inch) Gauge: (25G x1) + (22G x1)    Standing Status:   Standing    Number of Occurrences:   1    Order Specific Question:   Specify    Answer:   Block Tray     Time-out: 1101 I initiated and conducted the "Time-out" before starting the procedure, as per protocol. The patient was asked to participate by confirming the accuracy of the "Time Out" information. Verification of the correct person, site, and procedure were performed and confirmed by me, the nursing staff, and the patient. "Time-out" conducted as per Joint Commission's Universal Protocol (UP.01.01.01). Procedure checklist: Completed   H&P (Pre-op  Assessment)  Wanda Martinez is a 88 y.o. (year old), female patient, seen today for interventional treatment. She  has a past surgical history that includes Tonsillectomy and adenoidectomy (81); Vesicovaginal fistula closure w/ TAH (1983); Breast surgery (1986); Breast enhancement surgery (1987); Breast implant removal; Breast implant removal (Right, 08/29/2012);  Mastectomy (08/2012); Abdominal hysterectomy; Colonoscopy with propofol (N/A, 09/13/2016); and Breast biopsy (2013). Wanda Martinez has a current medication list which includes the following prescription(s): amlodipine, aspirin ec, calcium carbonate, citalopram, cyclobenzaprine, donepezil, gabapentin, glucosamine sulfate, glucosamine-chondroitin, hydrochlorothiazide, hydrocodone-acetaminophen, [START ON 01/29/2023] hydrocodone-acetaminophen, letrozole, losartan, lubiprostone, magnesium oxide, melatonin, meloxicam, metoprolol succinate, multivitamin, naloxone, nitrofurantoin (macrocrystal-monohydrate), omega-3, pantoprazole, rosuvastatin, and hydrocodone-acetaminophen. Her primarily concern today is the Back Pain and Knee Pain  She is allergic to sulfa antibiotics and vesicare [solifenacin].   Last encounter: My last encounter with her was on 01/08/2023. Pertinent problems: Wanda Martinez has Headache; Parkinson's disease; Chronic low back pain (1ry area of Pain) (Bilateral) (R>L) w/o sciatica; Lumbar spondylosis; Chronic hip pain (Bilateral); Chronic neck pain; Cervical spondylosis; Chronic cervical radicular pain (Right); Diffuse myofascial pain syndrome; Neurogenic pain; Chronic upper back pain (Right); Myofascial pain syndrome (Right) (cervicothoracic); Lumbar facet syndrome (Bilateral) (R>L); History of breast cancer; Cervical facet hypertrophy; Cervical facet syndrome (Gate); Chronic shoulder pain (Right); Chronic pain syndrome; Chronic sacroiliac joint pain (Left); Chronic sacroiliac joint pain (Right); 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, L4-5, L5-S1) (Right); Lumbar spinal stenosis (with neurogenic claudication) (L3-4); Chronic lower extremity pain (2ry area of Pain) (Right); Chronic lumbar radicular pain (S1) (Right); Trochanteric bursitis of hip (Bilateral); Spondylosis without myelopathy or radiculopathy, lumbosacral region; Trigger point with back pain (Right); DDD (degenerative disc disease), lumbosacral;  Chronic upper extremity pain (Right); Chronic thoracic  back pain (Bilateral) (L>R); Trigger point of thoracic region (Bilateral) (L>R); Other specified dorsopathies, sacral and sacrococcygeal region; Sacroiliac joint dysfunction (Right); Osteoarthritis of sacroiliac joint (Right); Somatic dysfunction of sacroiliac joint (Right); Chronic musculoskeletal pain; Facial pain; Chronic hip pain (Right); Osteoarthritis of hip (Right); Left ear pain; Abnormal MRI, lumbar spine (08/23/2022); Osteoarthritis involving multiple joints; Other spondylosis, sacral and sacrococcygeal region; Lumbar facet hypertrophy (Multilevel) (Bilateral); Chronic knee pain (Right); Osteoarthritis of knee (Right); Trigger point of shoulder region (Right); Osteoarthritis of AC (acromioclavicular) joint (Right); Osteoarthritis of shoulder (Right); Unspecified injury of muscle(s) and tendon(s) of the rotator cuff of shoulder, sequela (Right); Cervicalgia; DDD (degenerative disc disease), lumbar; Lumbar radicular pain (Bilateral); Lumbar foraminal stenosis (Right: L3-4, L4-5, L5-S1) (Left: L2-3); Abnormal MRI, hip (Right) (05/04/2022); Abnormal MRI, knee (01/04/2022); and Tricompartment osteoarthritis of knee (Right) on their pertinent problem list. Pain Assessment: Severity of Chronic pain is reported as a 8 /10. Location: Knee Right, Left, Anterior, Posterior/Denies. Onset: More than a month ago. Quality: Constant, Aching. Timing: Constant. Modifying factor(s): Sitting and keeping weight off feet. Vitals:  height is '5\' 4"'$  (1.626 m) and weight is 185 lb (83.9 kg). Her temporal temperature is 97.3 F (36.3 C) (abnormal). Her blood pressure is 136/64 and her pulse is 51 (abnormal). Her respiration is 16 and oxygen saturation is 100%.   Reason for encounter: "interventional pain management therapy due pain of at least four (4) weeks in duration, with failure to respond and/or inability to tolerate more conservative care.   Site Confirmation: [name] was asked to confirm the procedure and laterality before marking the site.  Consent: Before the procedure and under the influence of no sedative(s), amnesic(s), or anxiolytics, the patient was informed of the treatment options, risks and possible complications. To fulfill our ethical and legal obligations, as recommended by the American Medical Association's Code of Ethics, I have informed the patient of my clinical impression; the nature and purpose of the treatment or procedure; the risks, benefits, and possible complications of the intervention; the alternatives, including doing nothing; the risk(s) and benefit(s) of the alternative treatment(s) or procedure(s); and the risk(s) and benefit(s) of doing nothing. The patient was provided information about the general risks and possible complications associated with the procedure. These may include, but are not limited to: failure to achieve desired goals, infection, bleeding, organ or nerve damage, allergic reactions, paralysis, and death. In addition, the patient was informed of those risks and complications associated to Spine-related procedures, such as failure to decrease pain; infection (i.e.: Meningitis, epidural or intraspinal abscess); bleeding (i.e.: epidural hematoma, subarachnoid hemorrhage, or any other type of intraspinal or peri-dural bleeding); organ or nerve damage (i.e.: Any type of peripheral nerve, nerve root, or spinal cord injury) with subsequent damage to sensory, motor, and/or autonomic systems, resulting in permanent pain, numbness, and/or weakness of one or several areas of the body; allergic reactions; (i.e.: anaphylactic reaction); and/or death. Furthermore, the patient was informed of those risks and complications associated with the medications. These include, but are not limited to: allergic reactions (i.e.: anaphylactic or anaphylactoid reaction(s)); adrenal axis suppression; blood sugar elevation that in diabetics may  result in ketoacidosis or comma; water retention that in patients with history of congestive heart failure may result in shortness of breath, pulmonary edema, and decompensation with resultant heart failure; weight gain; swelling or edema; medication-induced neural toxicity; particulate matter embolism and blood vessel occlusion with resultant organ, and/or nervous system infarction; and/or aseptic necrosis of one or more joints. Finally,  the patient was informed that Medicine is not an exact science; therefore, there is also the possibility of unforeseen or unpredictable risks and/or possible complications that may result in a catastrophic outcome. The patient indicated having understood very clearly. We have given the patient no guarantees and we have made no promises. Enough time was given to the patient to ask questions, all of which were answered to the patient's satisfaction. Wanda Martinez has indicated that she wanted to continue with the procedure. Attestation: I, the ordering provider, attest that I have discussed with the patient the benefits, risks, side-effects, alternatives, likelihood of achieving goals, and potential problems during recovery for the procedure that I have provided informed consent.  Date  Time: 01/10/2023 10:00 AM   Prophylactic antibiotics  Anti-infectives (From admission, onward)    None      Indication(s): None identified   Description of procedure   Start Time: 1101 hrs  Local Anesthesia: Once the patient was positioned, prepped, and time-out was completed. The target area was identified located. The skin was marked with an approved surgical skin marker. Once marked, the skin (epidermis, dermis, and hypodermis), and deeper tissues (fat, connective tissue and muscle) were infiltrated with a small amount of a short-acting local anesthetic, loaded on a 10cc syringe with a 25G, 1.5-in  Needle. An appropriate amount of time was allowed for local anesthetics to take  effect before proceeding to the next step. Local Anesthetic: Lidocaine 1-2% The unused portion of the local anesthetic was discarded in the proper designated containers. Safety Precautions: Aspiration looking for blood return was conducted prior to all injections. At no point did I inject any substances, as a needle was being advanced. Before injecting, the patient was told to immediately notify me if she was experiencing any new onset of "ringing in the ears, or metallic taste in the mouth". No attempts were made at seeking any paresthesias. Safe injection practices and needle disposal techniques used. Medications properly checked for expiration dates. SDV (single dose vial) medications used. After the completion of the procedure, all disposable equipment used was discarded in the proper designated medical waste containers.  Technical description: Protocol guidelines were followed. After positioning, the target area was identified and prepped in the usual manner. Skin & deeper tissues infiltrated with local anesthetic. Appropriate amount of time allowed to pass for local anesthetics to take effect. Proper needle placement secured. Once satisfactory needle placement was confirmed, I proceeded to inject the desired solution in slow, incremental fashion, intermittently assessing for discomfort or any signs of abnormal or undesired spread of substance. Once completed, the needle was removed and disposed of, as per hospital protocols. The area was cleaned, making sure to leave some of the prepping solution back to take advantage of its long term bactericidal properties.  Aspiration:  Negative          Vitals:   01/10/23 0958 01/10/23 1103  BP: (!) 125/57 136/64  Pulse: (!) 55 (!) 51  Resp: 18 16  Temp: (!) 97.3 F (36.3 C)   TempSrc: Temporal   SpO2:  100%  Weight: 185 lb (83.9 kg)   Height: '5\' 4"'$  (1.626 m)     End Time: 1102 hrs   Imaging guidance  Imaging-assisted Technique: None  required. Indication(s): N/A Exposure Time: N/A Contrast: None Fluoroscopic Guidance: N/A Ultrasound Guidance: N/A Interpretation: N/A   Post-op assessment  Post-procedure Vital Signs:  Pulse/HCG Rate: (!) 51  Temp: (!) 97.3 F (36.3 C) Resp: 16 BP: 136/64 SpO2: 100 %  EBL: None  Complications: No immediate post-treatment complications observed by team, or reported by patient.  Note: The patient tolerated the entire procedure well. A repeat set of vitals were taken after the procedure and the patient was kept under observation following institutional policy, for this type of procedure. Post-procedural neurological assessment was performed, showing return to baseline, prior to discharge. The patient was provided with post-procedure discharge instructions, including a section on how to identify potential problems. Should any problems arise concerning this procedure, the patient was given instructions to immediately contact us, at any time, without hesitation. In any case, we plan to contact the patient by telephone for a follow-up status report regarding this interventional procedure.  Comments:  No additional relevant information.   Plan of care  Chronic Opioid Analgesic:  Hydrocodone/APAP 5/325, 1 tab PO q 8 hrs (15 mg/day of hydrocodone) MME/day: 15 mg/day.   Medications administered: We administered pentafluoroprop-tetrafluoroeth, lidocaine (PF), ropivacaine (PF) 2 mg/mL (0.2%), and methylPREDNISolone acetate.  Follow-up plan:   Return in about 2 weeks (around 01/24/2023) for [facility]): (R) SI BLK #4, (PPE).      Interventional Therapies  Risk  Complexity Considerations:    WNL   Planned  Pending:   Therapeutic right SI joint injection #4  Therapeutic right IA steroid knee injection #4 (01/10/2023)   Under consideration:   Diagnostic right genicular NB  Diagnostic bilateral cervical facet MBB  Therapeutic right L3 and L4 TFESI #1    Completed:   Therapeutic right  superior/lateral trapezius muscle TPI x1 (04/19/2021)  Therapeutic right thoracic back TPI x2 (10/17/2018) (DKFUA)  Therapeutic left thoracic back TPI x1 (10/17/2018) (DKFUA)  Therapeutic right quadratus lumborum and erector spinae muscle TPI x1 (02/12/2018)  Therapeutic bilateral lumbar facet MBB (L2-S1) (FCT: L3-4,L4-5,L5-S1) x5 (08/09/2021) (100/100/50)  Therapeutic bilateral lumbar facet MBB (L4-S1) (FCT: L5-S1) x1 (08/29/2022) (100/100/100 x3 days/R:0L:100)  Therapeutic right lumbar facet MBB (L2-S1) x4 (12/21/2020) (DKFUA)  Therapeutic right lumbar facet RFA x3 (09/28/2022) ( Therapeutic left lumbar facet RFA x2 (11/30/2022) (100/60/90/>75)  Diagnostic right SI joint Blk x3 (12/21/2020)  (DKFUA)  Diagnostic left SI joint Blk x1 (08/09/2017) (100/90/20/<25)  Therapeutic right SI joint RFA x3 (06/05/2019) (100/100/85/>50)  Therapeutic midline L2-3 LESI x1 (10/04/2017) (100/100/85/>50)  Therapeutic right L3-4 LESI x2 (11/04/2020) (100/100/20/<50)  Therapeutic right L4-5 LESI x2 (03/30/2022) (100/100/75/75)  Therapeutic right L5-S1 LESI x2 (12/04/2019) (100/100/50/75)  Diagnostic/therapeutic right L3 and L4 TFESI x1 (07/27/2022) (100/100/75/50)  Therapeutic right L5 TFESI x4 (02/14/2022) (100/100/70/75)  Therapeutic right S1 TFESI x1 (11/04/2020) (100/100/20/<50)  Diagnostic bilateral superficial trochanteric bursa injec. x1 (04/30/2018) (DKFUA)  Diagnostic right IA hip injection x1 (02/13/2019) (DKFUA)  Diagnostic/therapeutic right IA steroid knee inj. x3 (08/15/2022) (100/100/100/80)  Therapeutic right (Zilretta) knee injection x1 (10/06/2021)  (DKFUA)  Therapeutic right (Monovisc) knee injection x1 (06/07/2021) (100/100/100/75)    Therapeutic  Palliative (PRN) options:   Therapeutic/palliative lumbar facet RFA  Therapeutic/palliative lumbar facet MBB  Therapeutic left L2 TFESI (this level is responsible for her low back pain and left lower extremity pain.)   Pharmacotherapy:  Nonopioids  transfer 12/21/2020: Gabapentin Recommendations:   None at this time.      Recent Visits Date Type Provider Dept  01/08/23 Office Visit Milinda Pointer, MD [facility]  11/30/22 Procedure visit Milinda Pointer, MD [facility]  Showing recent visits within past 90 days and meeting all other requirements Today's Visits Date Type Provider Dept  01/10/23 Office Visit Milinda Pointer, MD [facility]  Showing today's visits and  meeting all other requirements Future Appointments Date Type Provider Dept  02/19/23 Appointment Milinda Pointer, MD [facility]  Showing future appointments within next 90 days and meeting all other requirements   Disposition: Discharge home  Discharge (Date  Time): 01/10/2023; 1105 hrs.   Primary Care Physician: Einar Pheasant, MD Location: [facility] Note by: Gaspar Cola, MD Date: 01/10/2023; Time: 12:43 PM  DISCLAIMER: Medicine is not an Chief Strategy Officer. It has no guarantees or warranties. The decision to proceed with this intervention was based on the information collected from the patient. Conclusions were drawn from the patient's questionnaire, interview, and examination. Because information was provided in large part by the patient, it cannot be guaranteed that it has not been purposely or unconsciously manipulated or altered. Every effort has been made to obtain as much accurate, relevant, available data as possible. Always take into account that the treatment will also be dependent on availability of resources and existing treatment guidelines, considered by other Pain Management Specialists as being common knowledge and practice, at the time of the intervention. It is also important to point out that variation in procedural techniques and pharmacological choices are the acceptable norm. For Medico-Legal review purposes, the indications, contraindications, technique, and  results of the these procedures should only be evaluated, judged and interpreted by a Board-Certified Interventional Pain Specialist with extensive familiarity and expertise in the same exact procedure and technique.

## 2023-01-11 ENCOUNTER — Telehealth: Payer: Medicare Other | Admitting: Pain Medicine

## 2023-01-11 DIAGNOSIS — D485 Neoplasm of uncertain behavior of skin: Secondary | ICD-10-CM | POA: Diagnosis not present

## 2023-01-11 DIAGNOSIS — L638 Other alopecia areata: Secondary | ICD-10-CM | POA: Diagnosis not present

## 2023-01-11 DIAGNOSIS — D2271 Melanocytic nevi of right lower limb, including hip: Secondary | ICD-10-CM | POA: Diagnosis not present

## 2023-01-11 DIAGNOSIS — D225 Melanocytic nevi of trunk: Secondary | ICD-10-CM | POA: Diagnosis not present

## 2023-01-11 DIAGNOSIS — L570 Actinic keratosis: Secondary | ICD-10-CM | POA: Diagnosis not present

## 2023-01-11 DIAGNOSIS — Z872 Personal history of diseases of the skin and subcutaneous tissue: Secondary | ICD-10-CM | POA: Diagnosis not present

## 2023-01-11 DIAGNOSIS — L578 Other skin changes due to chronic exposure to nonionizing radiation: Secondary | ICD-10-CM | POA: Diagnosis not present

## 2023-01-12 DIAGNOSIS — H02831 Dermatochalasis of right upper eyelid: Secondary | ICD-10-CM | POA: Diagnosis not present

## 2023-01-12 DIAGNOSIS — H02834 Dermatochalasis of left upper eyelid: Secondary | ICD-10-CM | POA: Diagnosis not present

## 2023-01-12 DIAGNOSIS — H02403 Unspecified ptosis of bilateral eyelids: Secondary | ICD-10-CM | POA: Diagnosis not present

## 2023-01-17 ENCOUNTER — Telehealth: Payer: Self-pay

## 2023-01-17 NOTE — Telephone Encounter (Signed)
Her insurance company requires 2 or more provocative maneuvers in order to authorize E[place]. There was only one listed in the notes.

## 2023-01-19 ENCOUNTER — Telehealth: Payer: Self-pay

## 2023-01-19 DIAGNOSIS — L638 Other alopecia areata: Secondary | ICD-10-CM

## 2023-01-19 NOTE — Telephone Encounter (Signed)
Ok - will need diagnosis code

## 2023-01-19 NOTE — Telephone Encounter (Signed)
Patient brought in an order requisition from [facility] for a CMP, lipid and CBC. She wants to get them done here. Okay to order?

## 2023-01-19 NOTE — Telephone Encounter (Signed)
Labs ordered. If patient returns call, schedule fasting lab appt for her.

## 2023-01-22 ENCOUNTER — Ambulatory Visit: Payer: Medicare Other | Admitting: Urology

## 2023-01-26 ENCOUNTER — Encounter: Payer: Self-pay | Admitting: Internal Medicine

## 2023-01-26 DIAGNOSIS — K449 Diaphragmatic hernia without obstruction or gangrene: Secondary | ICD-10-CM | POA: Diagnosis not present

## 2023-01-26 DIAGNOSIS — H02834 Dermatochalasis of left upper eyelid: Secondary | ICD-10-CM | POA: Diagnosis not present

## 2023-01-26 DIAGNOSIS — H02409 Unspecified ptosis of unspecified eyelid: Secondary | ICD-10-CM | POA: Insufficient documentation

## 2023-01-26 DIAGNOSIS — H02831 Dermatochalasis of right upper eyelid: Secondary | ICD-10-CM | POA: Diagnosis not present

## 2023-01-26 DIAGNOSIS — I129 Hypertensive chronic kidney disease with stage 1 through stage 4 chronic kidney disease, or unspecified chronic kidney disease: Secondary | ICD-10-CM | POA: Diagnosis not present

## 2023-01-26 DIAGNOSIS — K219 Gastro-esophageal reflux disease without esophagitis: Secondary | ICD-10-CM | POA: Diagnosis not present

## 2023-01-26 DIAGNOSIS — H02403 Unspecified ptosis of bilateral eyelids: Secondary | ICD-10-CM | POA: Diagnosis not present

## 2023-01-26 DIAGNOSIS — N182 Chronic kidney disease, stage 2 (mild): Secondary | ICD-10-CM | POA: Diagnosis not present

## 2023-01-26 DIAGNOSIS — E785 Hyperlipidemia, unspecified: Secondary | ICD-10-CM | POA: Diagnosis not present

## 2023-01-29 ENCOUNTER — Ambulatory Visit: Payer: Medicare HMO | Admitting: Urology

## 2023-01-29 VITALS — BP 129/68 | HR 62 | Wt 192.0 lb

## 2023-01-29 DIAGNOSIS — N3020 Other chronic cystitis without hematuria: Secondary | ICD-10-CM

## 2023-01-29 DIAGNOSIS — N390 Urinary tract infection, site not specified: Secondary | ICD-10-CM

## 2023-01-29 LAB — URINALYSIS, COMPLETE
Bilirubin, UA: NEGATIVE
Glucose, UA: NEGATIVE
Nitrite, UA: NEGATIVE
RBC, UA: NEGATIVE
Specific Gravity, UA: 1.015 (ref 1.005–1.030)
Urobilinogen, Ur: 0.2 mg/dL (ref 0.2–1.0)
pH, UA: 7.5 (ref 5.0–7.5)

## 2023-01-29 LAB — MICROSCOPIC EXAMINATION

## 2023-01-29 MED ORDER — NITROFURANTOIN MONOHYD MACRO 100 MG PO CAPS: 100.0000 mg | ORAL_CAPSULE | Freq: Every day | ORAL | 3 refills | Status: DC

## 2023-01-29 MED ORDER — MIRABEGRON ER 50 MG PO TB24: 50.0000 mg | ORAL_TABLET | Freq: Every day | ORAL | 3 refills | Status: DC

## 2023-01-29 NOTE — Progress Notes (Signed)
01/29/2023 2:18 PM   Wanda Martinez [birth year] [id number]  Referring provider: Einar Pheasant, [id number] [place],   [id number]  Chief Complaint  Patient presents with   chronic cystitis    Follow up    HPI: I was consulted to assess the patient's recurrent bladder infections.  She gets frequency and burning that usually respond to antibiotics.  She gets 6 bladder infections a year.  She has had some positive negative cultures on review of medical records  When she is not infected she voids every 1-2 hours and gets up 3 times a night.  She has urge incontinence especially if she holds it too long.  She can might have mild stress incontinence.  She has moderate bedwetting  She has had a hysterectomy     Pathophysiology of chronic cystitis discussed.  Role of prophylaxis discussed.  Reassess for pelvic semination cystoscopy in 6 weeks with cystoscopy.  Call if ultrasound abnormal.  Call if urine culture abnormal.  Role of probiotics discussed.  See if mixed incontinence down regulate   Macrobid 100 mg 3x11 sent.   Today Frequency stable.  No hydronephrosis on ultrasound or stones.  Last culture negative.  No infection.  Clinically doing well   Normal cysto   Clinically doing well on urinary prophylaxis.  Reassess in 4 months.   Today Frequency stable.  She may have had 1 breakthrough when she had steroids for her back.  Otherwise does well.  Infection free today.  Today Infection free on Macrodantin.  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  Clinically otherwise not infected       PMH: Past Medical History:  Diagnosis Date   Acute postoperative pain 10/25/2017   Anemia    Arm pain 07/26/2015   Arthritis    Arthritis, degenerative 03/26/2014   Back pain 11/01/2013   Bladder infection 06/2018   Breast cancer ([place])    Masectomy - left - 1986    Breast cancer [facility])     Mastectomy-right -2014   CHEST PAIN 04/29/2010   Qualifier: Diagnosis of  By: Wynetta Emery RN, Erika     Chronic cystitis    Cystocele 02/22/2013   Cystocele, midline 08/19/2013   Degeneration of intervertebral disc of lumbosacral region 03/26/2014   DYSPNEA 04/29/2010   Qualifier: Diagnosis of  By: Wynetta Emery RN, Erika     Enthesopathy of hip 03/26/2014   GERD (gastroesophageal reflux disease)    Hiatal hernia    HTN (hypertension)    Hypokalemia 06/2018   Hyponatremia 06/2018   LBP (low back pain) 03/26/2014   Neck pain 11/01/2013   Parkinson disease    Pneumonia 06/2018   Sinusitis 02/07/2015   Skin lesions 07/12/2014   Urinary incontinence    mixed     Surgical History: Past Surgical History:  Procedure Laterality Date   ABDOMINAL HYSTERECTOMY     BREAST BIOPSY  2013   BREAST ENHANCEMENT SURGERY  1987   BREAST IMPLANT REMOVAL     BREAST IMPLANT REMOVAL Right 08/29/2012   BREAST SURGERY  1986   s/p left mastectomy   COLONOSCOPY WITH PROPOFOL N/A 09/13/2016   Procedure: COLONOSCOPY WITH PROPOFOL;  Surgeon: Manya Silvas, MD;  Location: [place];  Service: Endoscopy;  Laterality: N/A;   MASTECTOMY  08/2012   right   TONSILLECTOMY AND ADENOIDECTOMY  [place]  Home Medications:  Allergies as of 01/29/2023       Reactions   Sulfa Antibiotics Nausea Only   Vesicare [solifenacin] Other (See Comments)   Constipation Constipation        Medication List        Accurate as of January 29, 2023  2:18 PM. If you have any questions, ask your nurse or doctor.          amLODipine 2.5 MG tablet Commonly known as: NORVASC Take 1 tablet (2.5 mg total) by mouth daily.   aspirin EC 81 MG tablet Take 81 mg by mouth daily as needed.   calcium carbonate 1500 (600 Ca) MG Tabs tablet Commonly known as: OSCAL Take 1 tablet by mouth daily.   citalopram 20 MG tablet Commonly known as: CELEXA TAKE 1 TABLET BY MOUTH ONCE DAILY.    cyclobenzaprine 10 MG tablet Commonly known as: FLEXERIL TAKE ONE TABLET BY MOUTH AT BEDTIME.   donepezil 10 MG tablet Commonly known as: ARICEPT TAKE 1 TABLET BY MOUTH ONCE NIGHTLY.   gabapentin 300 MG capsule Commonly known as: NEURONTIN TAKE (2) CAPSULES BY MOUTH TWICE DAILY   GLUCOSAMINE SULFATE PO Take by mouth daily.   Glucosamine-Chondroitin 500-400 MG Caps Take 2 capsules by mouth daily.   hydrochlorothiazide 25 MG tablet Commonly known as: HYDRODIURIL TAKE 1 TABLET BY MOUTH ONCE DAILY. (PATIENT TAKES 1/2 ONCE DAILY AND EXTRA 1/2 IF NEEDED)   HYDROcodone-acetaminophen 5-325 MG tablet Commonly known as: NORCO/VICODIN Take 1 tablet by mouth 3 (three) times daily. Must last 30 days.   HYDROcodone-acetaminophen 5-325 MG tablet Commonly known as: NORCO/VICODIN Take 1 tablet by mouth 3 (three) times daily. Must last 30 days.   HYDROcodone-acetaminophen 5-325 MG tablet Commonly known as: NORCO/VICODIN Take 1 tablet by mouth 3 (three) times daily. Must last 30 days.   letrozole 2.5 MG tablet Commonly known as: FEMARA Take 2.5 mg by mouth daily.   losartan 50 MG tablet Commonly known as: COZAAR TAKE (2) TABLETS BY MOUTH ONCE DAILY.   lubiprostone 24 MCG capsule Commonly known as: AMITIZA TAKE 1 CAPSULE BY MOUTH ONCE DAILY WITH BREAKFAST.   magnesium oxide 400 (240 Mg) MG tablet Commonly known as: MAG-OX TAKE 1 TABLET BY MOUTH ONCE DAILY.   melatonin 1 MG Tabs tablet Take 3 mg by mouth at bedtime.   meloxicam 7.5 MG tablet Commonly known as: MOBIC TAKE 1 TABLET BY MOUTH ONCE DAILY.   metoprolol succinate 25 MG 24 hr tablet Commonly known as: TOPROL-XL TAKE 1 TABLET BY MOUTH TWICE DAILY   multivitamin tablet Take 1 tablet by mouth daily.   naloxone 4 MG/0.1ML Liqd nasal spray kit Commonly known as: NARCAN Place 1 spray into the nose as needed for up to 365 doses (for opioid-induced respiratory depresssion). In case of emergency (overdose), spray once  into each nostril. If no response within 3 minutes, repeat application and call [phone].   nitrofurantoin (macrocrystal-monohydrate) 100 MG capsule Commonly known as: MACROBID Take 1 capsule (100 mg total) by mouth daily.   Omega-3 1000 MG Caps Take by mouth.   pantoprazole 40 MG tablet Commonly known as: PROTONIX Take 1 tablet (40 mg total) by mouth daily.   rosuvastatin 20 MG tablet Commonly known as: CRESTOR TAKE 1 TABLET BY MOUTH ONCE DAILY.        Allergies:  Allergies  Allergen Reactions   Sulfa Antibiotics Nausea Only   Vesicare [Solifenacin] Other (See Comments)    Constipation Constipation    Family History: Family  History  Problem Relation Age of Onset   Diabetes Father    Stroke Father    Colon polyps Father    Stroke Mother    Parkinson's disease Mother     Social History:  reports that she has never smoked. She has never used smokeless tobacco. She reports that she does not drink alcohol and does not use drugs.  ROS:                                        Physical Exam: LMP 12/18/1981   Constitutional:  Alert and oriented, No acute distress. HEENT: [place] AT, moist mucus membranes.  Trachea midline, no masses.   Laboratory Data: Lab Results  Component Value Date   WBC 5.8 10/09/2022   HGB 8.1 (L) 10/09/2022   HCT 26.2 (L) 10/09/2022   MCV 99.6 10/09/2022   PLT 229 10/09/2022    Lab Results  Component Value Date   CREATININE 1.40 (H) 09/26/2022    No results found for: "PSA"  No results found for: "TESTOSTERONE"  Lab Results  Component Value Date   HGBA1C 6.2 09/26/2022    Urinalysis    Component Value Date/Time   COLORURINE YELLOW 03/17/2022 1009   APPEARANCEUR CLEAR 03/17/2022 1009   APPEARANCEUR Clear 06/13/2021 1059   LABSPEC 1.010 03/17/2022 1009   LABSPEC 1.006 10/08/2014 1745   PHURINE 6.5 03/17/2022 1009   GLUCOSEU NEGATIVE 03/17/2022 1009   HGBUR NEGATIVE 03/17/2022 1009   BILIRUBINUR NEGATIVE  03/17/2022 1009   BILIRUBINUR Negative 06/13/2021 1059   BILIRUBINUR Negative 10/08/2014 1745   KETONESUR NEGATIVE 03/17/2022 1009   PROTEINUR Negative 06/13/2021 [place] 10/17/2019 1608   UROBILINOGEN 0.2 03/17/2022 1009   NITRITE NEGATIVE 03/17/2022 1009   LEUKOCYTESUR TRACE (A) 03/17/2022 1009   LEUKOCYTESUR Trace 10/08/2014 1745    Pertinent Imaging:   Assessment & Plan: Urine sent for culture.  Renew Macrodantin.  Reassess in 6 weeks on Myrbetriq 50 mg samples and prescription.  Call if culture positive  There are no diagnoses linked to this encounter.  No follow-ups on file.  Reece Packer, MD  [facility] [address] [place], [place] [address] [phone]

## 2023-01-31 ENCOUNTER — Encounter: Payer: Self-pay | Admitting: Internal Medicine

## 2023-01-31 ENCOUNTER — Ambulatory Visit (INDEPENDENT_AMBULATORY_CARE_PROVIDER_SITE_OTHER): Payer: Medicare HMO | Admitting: Internal Medicine

## 2023-01-31 VITALS — BP 136/80 | HR 61 | Temp 98.1°F | Resp 16 | Ht 64.0 in | Wt 186.6 lb

## 2023-01-31 DIAGNOSIS — M810 Age-related osteoporosis without current pathological fracture: Secondary | ICD-10-CM

## 2023-01-31 DIAGNOSIS — I10 Essential (primary) hypertension: Secondary | ICD-10-CM

## 2023-01-31 DIAGNOSIS — K219 Gastro-esophageal reflux disease without esophagitis: Secondary | ICD-10-CM

## 2023-01-31 DIAGNOSIS — R739 Hyperglycemia, unspecified: Secondary | ICD-10-CM | POA: Diagnosis not present

## 2023-01-31 DIAGNOSIS — G8929 Other chronic pain: Secondary | ICD-10-CM

## 2023-01-31 DIAGNOSIS — I700 Atherosclerosis of aorta: Secondary | ICD-10-CM

## 2023-01-31 DIAGNOSIS — N1831 Chronic kidney disease, stage 3a: Secondary | ICD-10-CM

## 2023-01-31 DIAGNOSIS — F320 Major depressive disorder, single episode, mild: Secondary | ICD-10-CM

## 2023-01-31 DIAGNOSIS — D649 Anemia, unspecified: Secondary | ICD-10-CM | POA: Diagnosis not present

## 2023-01-31 DIAGNOSIS — Z853 Personal history of malignant neoplasm of breast: Secondary | ICD-10-CM

## 2023-01-31 DIAGNOSIS — E7800 Pure hypercholesterolemia, unspecified: Secondary | ICD-10-CM | POA: Diagnosis not present

## 2023-01-31 DIAGNOSIS — M545 Low back pain, unspecified: Secondary | ICD-10-CM

## 2023-01-31 DIAGNOSIS — N390 Urinary tract infection, site not specified: Secondary | ICD-10-CM

## 2023-01-31 DIAGNOSIS — G20A1 Parkinson's disease without dyskinesia, without mention of fluctuations: Secondary | ICD-10-CM

## 2023-01-31 NOTE — Progress Notes (Signed)
Subjective:    Patient ID: Wanda Martinez, female    DOB: Jan 16, 1935, 88 y.o.   MRN: [id number]  Patient here for  Chief Complaint  Patient presents with   Medical Management of Chronic Issues   Hypertension    HPI Here for follow up - hypertension and hypercholesterolemia.  Recently had eye surgery - blepharoplasty.  Tolerated well. Seeing Dr Grayland Ormond for f/u anemia.  Seeing urology for f/u chronic cystitis.  On macrodantin.  Planning for f/u in 6 weeks - on myrbetriq.  No chest pain reported.  Breathing stable.  No increased cough or congestion.  No abdominal pain or bowel change reported.     Past Medical History:  Diagnosis Date   Acute postoperative pain 10/25/2017   Anemia    Arm pain 07/26/2015   Arthritis    Arthritis, degenerative 03/26/2014   Back pain 11/01/2013   Bladder infection 06/2018   Breast cancer ([place])    Masectomy - left - 1986    Breast cancer [facility])    Mastectomy-right -2014   CHEST PAIN 04/29/2010   Qualifier: Diagnosis of  By: Wynetta Emery RN, Erika     Chronic cystitis    Cystocele 02/22/2013   Cystocele, midline 08/19/2013   Degeneration of intervertebral disc of lumbosacral region 03/26/2014   DYSPNEA 04/29/2010   Qualifier: Diagnosis of  By: Wynetta Emery RN, Erika     Enthesopathy of hip 03/26/2014   GERD (gastroesophageal reflux disease)    Hiatal hernia    HTN (hypertension)    Hypokalemia 06/2018   Hyponatremia 06/2018   LBP (low back pain) 03/26/2014   Neck pain 11/01/2013   Parkinson disease    Pneumonia 06/2018   Sinusitis 02/07/2015   Skin lesions 07/12/2014   Urinary incontinence    mixed    Past Surgical History:  Procedure Laterality Date   ABDOMINAL HYSTERECTOMY     BLEPHAROPLASTY     BREAST BIOPSY  2013   BREAST ENHANCEMENT SURGERY  1987   BREAST IMPLANT REMOVAL     BREAST IMPLANT REMOVAL Right 08/29/2012   BREAST SURGERY  1986   s/p left mastectomy   COLONOSCOPY WITH PROPOFOL N/A 09/13/2016   Procedure: COLONOSCOPY WITH PROPOFOL;   Surgeon: Manya Silvas, MD;  Location: [place];  Service: Endoscopy;  Laterality: N/A;   MASTECTOMY  08/2012   right   TONSILLECTOMY AND ADENOIDECTOMY  1979   VESICOVAGINAL FISTULA CLOSURE W/ TAH  1983   Family History  Problem Relation Age of Onset   Diabetes Father    Stroke Father    Colon polyps Father    Stroke Mother    Parkinson's disease Mother    Social History   Socioeconomic History   Marital status: Married    Spouse name: Not on file   Number of children: 3   Years of education: Not on file   Highest education level: Not on file  Occupational History   Not on file  Tobacco Use   Smoking status: Never   Smokeless tobacco: Never   Tobacco comments:    tobacco use - no  Vaping Use   Vaping Use: Never used  Substance and Sexual Activity   Alcohol use: No    Alcohol/week: 0.0 standard drinks of alcohol   Drug use: No   Sexual activity: Never  Other Topics Concern   Not on file  Social History Narrative   Not on file   Social Determinants of Health   Financial Resource Strain: Low  Risk  (07/10/2019)   Overall Financial Resource Strain (CARDIA)    Difficulty of Paying Living Expenses: Not hard at all  Food Insecurity: No Food Insecurity (07/10/2019)   Hunger Vital Sign    Worried About Running Out of Food in the Last Year: Never true    Ran Out of Food in the Last Year: Never true  Transportation Needs: No Transportation Needs (07/12/2020)   PRAPARE - Hydrologist (Medical): No    Lack of Transportation (Non-Medical): No  Physical Activity: Not on file  Stress: No Stress Concern Present (07/12/2020)   [place]    Feeling of Stress : Not at all  Social Connections: Socially Integrated (07/12/2020)   Social Connection and Isolation Panel [NHANES]    Frequency of Communication with Friends and Family: Three times a week    Frequency of Social Gatherings  with Friends and Family: More than three times a week    Attends Religious Services: More than 4 times per year    Active Member of Clubs or Organizations: Yes    Attends Archivist Meetings: 1 to 4 times per year    Marital Status: Married     Review of Systems  Constitutional:  Negative for appetite change and unexpected weight change.  HENT:  Negative for congestion and sinus pressure.   Respiratory:  Negative for cough, chest tightness and shortness of breath.   Cardiovascular:  Negative for chest pain, palpitations and leg swelling.  Gastrointestinal:  Negative for abdominal pain, diarrhea, nausea and vomiting.  Genitourinary:  Negative for difficulty urinating and dysuria.  Musculoskeletal:  Negative for joint swelling and myalgias.  Skin:  Negative for color change and rash.  Neurological:  Negative for dizziness and headaches.  Psychiatric/Behavioral:  Negative for agitation and dysphoric mood.        Objective:     BP 136/80   Pulse 61   Temp 98.1 F (36.7 C) (Temporal)   Resp 16   Ht 5' 4"$  (1.626 m)   Wt 186 lb 9.6 oz (84.6 kg)   LMP 12/18/1981   SpO2 96%   BMI 32.03 kg/m  Wt Readings from Last 3 Encounters:  01/31/23 186 lb 9.6 oz (84.6 kg)  01/29/23 192 lb (87.1 kg)  01/10/23 185 lb (83.9 kg)    Physical Exam Vitals reviewed.  Constitutional:      General: She is not in acute distress.    Appearance: Normal appearance.  HENT:     Head: Normocephalic and atraumatic.     Right Ear: External ear normal.     Left Ear: External ear normal.  Eyes:     General: No scleral icterus.       Right eye: No discharge.        Left eye: No discharge.     Conjunctiva/sclera: Conjunctivae normal.  Neck:     Thyroid: No thyromegaly.  Cardiovascular:     Rate and Rhythm: Normal rate and regular rhythm.  Pulmonary:     Effort: No respiratory distress.     Breath sounds: Normal breath sounds. No wheezing.  Abdominal:     General: Bowel sounds are  normal.     Palpations: Abdomen is soft.     Tenderness: There is no abdominal tenderness.  Musculoskeletal:        General: No swelling or tenderness.     Cervical back: Neck supple. No tenderness.  Lymphadenopathy:  Cervical: No cervical adenopathy.  Skin:    Findings: No erythema or rash.  Neurological:     Mental Status: She is alert.  Psychiatric:        Mood and Affect: Mood normal.        Behavior: Behavior normal.      Outpatient Encounter Medications as of 01/31/2023  Medication Sig   amLODipine (NORVASC) 2.5 MG tablet Take 1 tablet (2.5 mg total) by mouth daily.   aspirin 81 MG EC tablet Take 81 mg by mouth daily as needed.   calcium carbonate (OSCAL) 1500 (600 Ca) MG TABS tablet Take 1 tablet by mouth daily.   citalopram (CELEXA) 20 MG tablet TAKE 1 TABLET BY MOUTH ONCE DAILY.   cyclobenzaprine (FLEXERIL) 10 MG tablet TAKE ONE TABLET BY MOUTH AT BEDTIME.   donepezil (ARICEPT) 10 MG tablet TAKE 1 TABLET BY MOUTH ONCE NIGHTLY.   erythromycin ophthalmic ointment Apply to eye.   gabapentin (NEURONTIN) 300 MG capsule TAKE (2) CAPSULES BY MOUTH TWICE DAILY   GLUCOSAMINE SULFATE PO Take by mouth daily.   Glucosamine-Chondroitin 500-400 MG CAPS Take 2 capsules by mouth daily.   hydrochlorothiazide (HYDRODIURIL) 25 MG tablet TAKE 1 TABLET BY MOUTH ONCE DAILY. (PATIENT TAKES 1/2 ONCE DAILY AND EXTRA 1/2 IF NEEDED)   HYDROcodone-acetaminophen (NORCO/VICODIN) 5-325 MG tablet Take 1 tablet by mouth 3 (three) times daily. Must last 30 days.   letrozole (FEMARA) 2.5 MG tablet Take 2.5 mg by mouth daily.   losartan (COZAAR) 50 MG tablet TAKE (2) TABLETS BY MOUTH ONCE DAILY.   lubiprostone (AMITIZA) 24 MCG capsule TAKE 1 CAPSULE BY MOUTH ONCE DAILY WITH BREAKFAST.   magnesium oxide (MAG-OX) 400 (240 Mg) MG tablet TAKE 1 TABLET BY MOUTH ONCE DAILY.   melatonin 1 MG TABS tablet Take 3 mg by mouth at bedtime.   meloxicam (MOBIC) 7.5 MG tablet TAKE 1 TABLET BY MOUTH ONCE DAILY.    metoprolol succinate (TOPROL-XL) 25 MG 24 hr tablet TAKE 1 TABLET BY MOUTH TWICE DAILY   mirabegron ER (MYRBETRIQ) 50 MG TB24 tablet Take 1 tablet (50 mg total) by mouth daily.   Multiple Vitamin (MULTIVITAMIN) tablet Take 1 tablet by mouth daily.   naloxone (NARCAN) nasal spray 4 mg/0.1 mL Place 1 spray into the nose as needed for up to 365 doses (for opioid-induced respiratory depresssion). In case of emergency (overdose), spray once into each nostril. If no response within 3 minutes, repeat application and call [phone].   nitrofurantoin, macrocrystal-monohydrate, (MACROBID) 100 MG capsule Take 1 capsule (100 mg total) by mouth daily.   Omega-3 1000 MG CAPS Take by mouth.   pantoprazole (PROTONIX) 40 MG tablet Take 1 tablet (40 mg total) by mouth daily.   rosuvastatin (CRESTOR) 20 MG tablet TAKE 1 TABLET BY MOUTH ONCE DAILY.   HYDROcodone-acetaminophen (NORCO/VICODIN) 5-325 MG tablet Take 1 tablet by mouth 3 (three) times daily. Must last 30 days.   HYDROcodone-acetaminophen (NORCO/VICODIN) 5-325 MG tablet Take 1 tablet by mouth 3 (three) times daily. Must last 30 days.   No facility-administered encounter medications on file as of 01/31/2023.     Lab Results  Component Value Date   WBC 5.2 01/31/2023   HGB 9.5 (L) 01/31/2023   HCT 29.2 (L) 01/31/2023   PLT 278.0 01/31/2023   GLUCOSE 98 01/31/2023   CHOL 123 01/31/2023   TRIG 112.0 01/31/2023   HDL 56.20 01/31/2023   LDLDIRECT 73.0 07/09/2018   LDLCALC 44 01/31/2023   ALT 31 01/31/2023   AST  42 (H) 01/31/2023   NA 137 01/31/2023   K 3.9 01/31/2023   CL 98 01/31/2023   CREATININE 1.75 (H) 01/31/2023   BUN 29 (H) 01/31/2023   CO2 28 01/31/2023   TSH 1.42 09/26/2022   HGBA1C 6.6 (H) 01/31/2023    DG PAIN CLINIC C-ARM 1-60 MIN NO REPORT  Result Date: 11/30/2022 Fluoro was used, but no Radiologist interpretation will be provided. Please refer to "NOTES" tab for provider progress note.      Assessment & Plan:   Hypercholesterolemia Assessment & Plan: On crestor.  Follow lipid panel and liver function tests.    Orders: -     Hepatic function panel -     Lipid panel  Hyperglycemia Assessment & Plan: Low carb diet and exercise.  Follow met b and a1c.   Orders: -     Hemoglobin A1c  Essential hypertension Assessment & Plan: Blood pressure as outlined.  Continue current medications:  Hctz, metoprolol, amlodipine and losartan.  Follow metabolic panel  Orders: -     CBC with Differential/Platelet -     Basic metabolic panel -     Ambulatory referral to Cardiology  Anemia, unspecified type Assessment & Plan: Sees hematology - Dr Grayland Ormond.  Following cbc.    Aortic atherosclerosis ([place]) Assessment & Plan: Continue crestor.    Chronic low back pain (1ry area of Pain) (Bilateral) (R>L) w/o sciatica Assessment & Plan: Being followed by pain clinic.     Stage 3a chronic kidney disease (HCC) Assessment & Plan: Avoid antiinflammatories.  Stay hdrated.  Follow metabolic panel.     Depression, major, single episode, mild (HCC) Assessment & Plan: Continue citalopram.  Overall appears to be handling things well.  Follow.     Frequent UTI Assessment & Plan: Saw urology - macrodantin, myrbetriq   Gastroesophageal reflux disease, unspecified whether esophagitis present Assessment & Plan: No upper symptoms reported. On protonix.    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  Patient completed 5 years of letrozole in May 2019.  No further interventions are needed.  Patient has had bilateral mastectomies, therefore no further mammograms are necessary.    Osteoporosis without current pathological fracture, unspecified osteoporosis type Assessment & Plan: zometa - per Dr Grayland Ormond    Parkinson's disease, unspecified  whether dyskinesia present, unspecified whether manifestations fluctuate Assessment & Plan: Followed by neurology.       Einar Pheasant, MD

## 2023-02-01 LAB — CBC WITH DIFFERENTIAL/PLATELET
Basophils Absolute: 0 10*3/uL (ref 0.0–0.1)
Basophils Relative: 0.5 % (ref 0.0–3.0)
Eosinophils Absolute: 0.1 10*3/uL (ref 0.0–0.7)
Eosinophils Relative: 1.6 % (ref 0.0–5.0)
HCT: 29.2 % — ABNORMAL LOW (ref 36.0–46.0)
Hemoglobin: 9.5 g/dL — ABNORMAL LOW (ref 12.0–15.0)
Lymphocytes Relative: 39.8 % (ref 12.0–46.0)
Lymphs Abs: 2.1 10*3/uL (ref 0.7–4.0)
MCHC: 32.7 g/dL (ref 30.0–36.0)
MCV: 91.9 fl (ref 78.0–100.0)
Monocytes Absolute: 0.5 10*3/uL (ref 0.1–1.0)
Monocytes Relative: 8.9 % (ref 3.0–12.0)
Neutro Abs: 2.6 10*3/uL (ref 1.4–7.7)
Neutrophils Relative %: 49.2 % (ref 43.0–77.0)
Platelets: 278 10*3/uL (ref 150.0–400.0)
RBC: 3.17 Mil/uL — ABNORMAL LOW (ref 3.87–5.11)
RDW: 14.5 % (ref 11.5–15.5)
WBC: 5.2 10*3/uL (ref 4.0–10.5)

## 2023-02-01 LAB — HEPATIC FUNCTION PANEL
ALT: 31 U/L (ref 0–35)
AST: 42 U/L — ABNORMAL HIGH (ref 0–37)
Albumin: 4.5 g/dL (ref 3.5–5.2)
Alkaline Phosphatase: 25 U/L — ABNORMAL LOW (ref 39–117)
Bilirubin, Direct: 0.1 mg/dL (ref 0.0–0.3)
Total Bilirubin: 0.4 mg/dL (ref 0.2–1.2)
Total Protein: 7.4 g/dL (ref 6.0–8.3)

## 2023-02-01 LAB — BASIC METABOLIC PANEL
BUN: 29 mg/dL — ABNORMAL HIGH (ref 6–23)
CO2: 28 mEq/L (ref 19–32)
Calcium: 9.9 mg/dL (ref 8.4–10.5)
Chloride: 98 mEq/L (ref 96–112)
Creatinine, Ser: 1.75 mg/dL — ABNORMAL HIGH (ref 0.40–1.20)
GFR: 26.29 mL/min — ABNORMAL LOW (ref >60.00)
Glucose, Bld: 98 mg/dL (ref 70–99)
Potassium: 3.9 mEq/L (ref 3.5–5.1)
Sodium: 137 mEq/L (ref 135–145)

## 2023-02-01 LAB — LIPID PANEL
Cholesterol: 123 mg/dL (ref 0–200)
HDL: 56.2 mg/dL (ref >39.00)
LDL Cholesterol: 44 mg/dL (ref 0–99)
NonHDL: 66.61
Total CHOL/HDL Ratio: 2
Triglycerides: 112 mg/dL (ref 0.0–149.0)
VLDL: 22.4 mg/dL (ref 0.0–40.0)

## 2023-02-01 LAB — HEMOGLOBIN A1C: Hgb A1c MFr Bld: 6.6 % — ABNORMAL HIGH (ref 4.6–6.5)

## 2023-02-02 ENCOUNTER — Other Ambulatory Visit: Payer: Self-pay

## 2023-02-02 DIAGNOSIS — R944 Abnormal results of kidney function studies: Secondary | ICD-10-CM

## 2023-02-02 DIAGNOSIS — E7800 Pure hypercholesterolemia, unspecified: Secondary | ICD-10-CM

## 2023-02-02 LAB — CULTURE, URINE COMPREHENSIVE

## 2023-02-04 ENCOUNTER — Encounter: Payer: Self-pay | Admitting: Internal Medicine

## 2023-02-04 NOTE — Assessment & Plan Note (Signed)
No upper symptoms reported.  On protonix.   

## 2023-02-04 NOTE — Assessment & Plan Note (Signed)
On crestor.  Follow lipid panel and liver function tests.   

## 2023-02-04 NOTE — Assessment & Plan Note (Signed)
Saw urology - [place], myrbetriq

## 2023-02-04 NOTE — Assessment & Plan Note (Signed)
Blood pressure as outlined.  Continue current medications:  Hctz, metoprolol, amlodipine and losartan.  Follow metabolic panel

## 2023-02-04 NOTE — Assessment & Plan Note (Signed)
Continue citalopram.  Overall appears to be handling things well.  Follow.

## 2023-02-04 NOTE — Assessment & Plan Note (Signed)
zometa - per Dr Grayland Ormond

## 2023-02-04 NOTE — Assessment & Plan Note (Signed)
Avoid antiinflammatories.  Stay hdrated.  Follow metabolic panel.

## 2023-02-04 NOTE — Assessment & Plan Note (Signed)
Sees hematology - Dr Grayland Ormond.  Following cbc.

## 2023-02-04 NOTE — Assessment & Plan Note (Signed)
Continue crestor 

## 2023-02-04 NOTE — Assessment & Plan Note (Signed)
Low carb diet and exercise.  Follow met b and a1c.   

## 2023-02-04 NOTE — Assessment & Plan Note (Signed)
Followed by neurology.   

## 2023-02-04 NOTE — Assessment & Plan Note (Signed)
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  Patient completed 5 years of letrozole in May 2019.  No further interventions are needed.  Patient has had bilateral mastectomies, therefore no further mammograms are necessary.  ?

## 2023-02-04 NOTE — Assessment & Plan Note (Signed)
Being followed by pain clinic.

## 2023-02-05 ENCOUNTER — Telehealth: Payer: Self-pay

## 2023-02-05 ENCOUNTER — Other Ambulatory Visit: Payer: Self-pay

## 2023-02-05 MED ORDER — CIPROFLOXACIN HCL 250 MG PO TABS: 250.0000 mg | ORAL_TABLET | Freq: Two times a day (BID) | ORAL | 0 refills | Status: AC

## 2023-02-05 MED ORDER — HYDROCHLOROTHIAZIDE 25 MG PO TABS: 12.5000 mg | ORAL_TABLET | Freq: Every day | ORAL | 11 refills | Status: DC

## 2023-02-05 NOTE — Telephone Encounter (Signed)
RX sent in

## 2023-02-05 NOTE — Telephone Encounter (Signed)
-----   Message from Bjorn Loser, MD sent at 02/05/2023  8:20 AM EST ----- Ciprofloxacin 250 mg twice a day for 7 days Then go back on daily antibiotic ----- Message ----- From: Kris Mouton, CMA Sent: 02/05/2023   8:02 AM EST To: Bjorn Loser, MD   ----- Message ----- From: Interface, [facility] Lab Results In Sent: 01/29/2023   4:36 PM EST To: [name]

## 2023-02-05 NOTE — Addendum Note (Signed)
Addended by: Kris Mouton on: 02/05/2023 11:09 AM   Modules accepted: Orders

## 2023-02-05 NOTE — Telephone Encounter (Signed)
Left message to call back  

## 2023-02-05 NOTE — Telephone Encounter (Signed)
Notified patient as instructed,Patient will pick up medication

## 2023-02-13 ENCOUNTER — Encounter: Payer: Self-pay | Admitting: Oncology

## 2023-02-13 ENCOUNTER — Inpatient Hospital Stay: Payer: Medicare HMO | Attending: Oncology | Admitting: Oncology

## 2023-02-13 ENCOUNTER — Other Ambulatory Visit (INDEPENDENT_AMBULATORY_CARE_PROVIDER_SITE_OTHER): Payer: Medicare HMO

## 2023-02-13 ENCOUNTER — Inpatient Hospital Stay: Payer: Medicare HMO

## 2023-02-13 VITALS — BP 141/68 | HR 53 | Temp 98.6°F | Resp 18 | Ht 64.0 in | Wt 190.0 lb

## 2023-02-13 DIAGNOSIS — Z79899 Other long term (current) drug therapy: Secondary | ICD-10-CM | POA: Diagnosis not present

## 2023-02-13 DIAGNOSIS — Z853 Personal history of malignant neoplasm of breast: Secondary | ICD-10-CM | POA: Diagnosis not present

## 2023-02-13 DIAGNOSIS — Z9013 Acquired absence of bilateral breasts and nipples: Secondary | ICD-10-CM | POA: Diagnosis not present

## 2023-02-13 DIAGNOSIS — G20A1 Parkinson's disease without dyskinesia, without mention of fluctuations: Secondary | ICD-10-CM | POA: Insufficient documentation

## 2023-02-13 DIAGNOSIS — R944 Abnormal results of kidney function studies: Secondary | ICD-10-CM

## 2023-02-13 DIAGNOSIS — E7800 Pure hypercholesterolemia, unspecified: Secondary | ICD-10-CM

## 2023-02-13 DIAGNOSIS — D649 Anemia, unspecified: Secondary | ICD-10-CM

## 2023-02-13 DIAGNOSIS — M818 Other osteoporosis without current pathological fracture: Secondary | ICD-10-CM

## 2023-02-13 DIAGNOSIS — M810 Age-related osteoporosis without current pathological fracture: Secondary | ICD-10-CM | POA: Insufficient documentation

## 2023-02-13 LAB — CBC WITH DIFFERENTIAL/PLATELET
Abs Immature Granulocytes: 0.02 10*3/uL (ref 0.00–0.07)
Basophils Absolute: 0 10*3/uL (ref 0.0–0.1)
Basophils Relative: 1 %
Eosinophils Absolute: 0.1 10*3/uL (ref 0.0–0.5)
Eosinophils Relative: 2 %
HCT: 27.1 % — ABNORMAL LOW (ref 36.0–46.0)
Hemoglobin: 8.7 g/dL — ABNORMAL LOW (ref 12.0–15.0)
Immature Granulocytes: 1 %
Lymphocytes Relative: 34 %
Lymphs Abs: 1.4 10*3/uL (ref 0.7–4.0)
MCH: 30.1 pg (ref 26.0–34.0)
MCHC: 32.1 g/dL (ref 30.0–36.0)
MCV: 93.8 fL (ref 80.0–100.0)
Monocytes Absolute: 0.5 10*3/uL (ref 0.1–1.0)
Monocytes Relative: 13 %
Neutro Abs: 2.1 10*3/uL (ref 1.7–7.7)
Neutrophils Relative %: 49 %
Platelets: 230 10*3/uL (ref 150–400)
RBC: 2.89 MIL/uL — ABNORMAL LOW (ref 3.87–5.11)
RDW: 14 % (ref 11.5–15.5)
WBC: 4.1 10*3/uL (ref 4.0–10.5)
nRBC: 0 % (ref 0.0–0.2)

## 2023-02-13 LAB — COMPREHENSIVE METABOLIC PANEL
ALT: 30 U/L (ref 0–44)
AST: 40 U/L (ref 15–41)
Albumin: 4 g/dL (ref 3.5–5.0)
Alkaline Phosphatase: 27 U/L — ABNORMAL LOW (ref 38–126)
Anion gap: 8 (ref 5–15)
BUN: 30 mg/dL — ABNORMAL HIGH (ref 8–23)
CO2: 27 mmol/L (ref 22–32)
Calcium: 9 mg/dL (ref 8.9–10.3)
Chloride: 100 mmol/L (ref 98–111)
Creatinine, Ser: 1.48 mg/dL — ABNORMAL HIGH (ref 0.44–1.00)
GFR, Estimated: 34 mL/min — ABNORMAL LOW (ref >60)
Glucose, Bld: 85 mg/dL (ref 70–99)
Potassium: 3.8 mmol/L (ref 3.5–5.1)
Sodium: 135 mmol/L (ref 135–145)
Total Bilirubin: 0.6 mg/dL (ref 0.3–1.2)
Total Protein: 7.3 g/dL (ref 6.5–8.1)

## 2023-02-13 NOTE — Progress Notes (Unsigned)
[place]  [phone] [phone]  ID: [name] OB: 1935/07/16  MR#: [id number]  [id number]  Patient Care Team: Einar Pheasant, MD as PCP - General (Internal Medicine)  CHIEF COMPLAINT: History of breast cancer, anemia unspecified.  INTERVAL HISTORY: Patient returns to clinic as an add-on after her primary care physician noted that her hemoglobin has been trending down.  She is anxious, but otherwise feels well.  She does not complain of any weakness or fatigue.  She continues to have tremor from her Parkinson's, but no other neurologic complaints.  She denies any recent fevers or illnesses.  She has a good appetite and denies weight loss.  She denies any chest pain, shortness of breath, cough, or hemoptysis.  She denies any nausea, vomiting, constipation, or diarrhea.  She has no urinary complaints.  Patient is no further specific complaints today.  REVIEW OF SYSTEMS:   Review of Systems  Constitutional: Negative.  Negative for fever, malaise/fatigue and weight loss.  Respiratory: Negative.  Negative for cough and shortness of breath.   Cardiovascular: Negative.  Negative for chest pain and leg swelling.  Gastrointestinal: Negative.  Negative for abdominal pain.  Genitourinary: Negative.   Musculoskeletal: Negative.  Negative for back pain and joint pain.  Skin: Negative.  Negative for rash.  Neurological:  Positive for tremors. Negative for sensory change, speech change, focal weakness and weakness.  Psychiatric/Behavioral:  Negative for depression. The patient is nervous/anxious.     As per HPI. Otherwise, a complete review of systems is negative.  PAST MEDICAL HISTORY: Past Medical History:  Diagnosis Date   Acute postoperative pain 10/25/2017   Anemia    Arm pain 07/26/2015   Arthritis    Arthritis, degenerative 03/26/2014   Back pain 11/01/2013   Bladder infection 06/2018   Breast cancer ([place])    Masectomy - left -  1986    Breast cancer [facility])    Mastectomy-right -2014   CHEST PAIN 04/29/2010   Qualifier: Diagnosis of  By: Wynetta Emery RN, Erika     Chronic cystitis    Cystocele 02/22/2013   Cystocele, midline 08/19/2013   Degeneration of intervertebral disc of lumbosacral region 03/26/2014   DYSPNEA 04/29/2010   Qualifier: Diagnosis of  By: Wynetta Emery RN, Erika     Enthesopathy of hip 03/26/2014   GERD (gastroesophageal reflux disease)    Hiatal hernia    HTN (hypertension)    Hypokalemia 06/2018   Hyponatremia 06/2018   LBP (low back pain) 03/26/2014   Neck pain 11/01/2013   Parkinson disease    Pneumonia 06/2018   Sinusitis 02/07/2015   Skin lesions 07/12/2014   Urinary incontinence    mixed     PAST SURGICAL HISTORY: Past Surgical History:  Procedure Laterality Date   ABDOMINAL HYSTERECTOMY     BLEPHAROPLASTY     BREAST BIOPSY  2013   BREAST ENHANCEMENT SURGERY  1987   BREAST IMPLANT REMOVAL     BREAST IMPLANT REMOVAL Right 08/29/2012   BREAST SURGERY  1986   s/p left mastectomy   COLONOSCOPY WITH PROPOFOL N/A 09/13/2016   Procedure: COLONOSCOPY WITH PROPOFOL;  Surgeon: Manya Silvas, MD;  Location: [facility];  Service: Endoscopy;  Laterality: N/A;   MASTECTOMY  08/2012   right   TONSILLECTOMY AND ADENOIDECTOMY  1979   VESICOVAGINAL FISTULA CLOSURE W/ TAH  1983    FAMILY HISTORY Family History  Problem Relation Age of Onset   Diabetes Father    Stroke Father  Colon polyps Father    Stroke Mother    Parkinson's disease Mother        ADVANCED DIRECTIVES:    HEALTH MAINTENANCE: Social History   Tobacco Use   Smoking status: Never   Smokeless tobacco: Never   Tobacco comments:    tobacco use - no  Vaping Use   Vaping Use: Never used  Substance Use Topics   Alcohol use: No    Alcohol/week: 0.0 standard drinks of alcohol   Drug use: No     Allergies  Allergen Reactions   Sulfa Antibiotics Nausea Only   Vesicare [Solifenacin] Other (See Comments)     Constipation Constipation    Current Outpatient Medications  Medication Sig Dispense Refill   amLODipine (NORVASC) 2.5 MG tablet Take 1 tablet (2.5 mg total) by mouth daily. 30 tablet 0   aspirin 81 MG EC tablet Take 81 mg by mouth daily as needed.     calcium carbonate (OSCAL) 1500 (600 Ca) MG TABS tablet Take 1 tablet by mouth daily.     citalopram (CELEXA) 20 MG tablet TAKE 1 TABLET BY MOUTH ONCE DAILY. 30 tablet 11   cyclobenzaprine (FLEXERIL) 10 MG tablet TAKE ONE TABLET BY MOUTH AT BEDTIME. 30 tablet 11   donepezil (ARICEPT) 10 MG tablet TAKE 1 TABLET BY MOUTH ONCE NIGHTLY. 30 tablet 11   erythromycin ophthalmic ointment Apply to eye.     gabapentin (NEURONTIN) 300 MG capsule TAKE (2) CAPSULES BY MOUTH TWICE DAILY 120 capsule 11   GLUCOSAMINE SULFATE PO Take by mouth daily.     Glucosamine-Chondroitin 500-400 MG CAPS Take 2 capsules by mouth daily.     hydrochlorothiazide (HYDRODIURIL) 25 MG tablet Take 0.5 tablets (12.5 mg total) by mouth daily. 15 tablet 11   HYDROcodone-acetaminophen (NORCO/VICODIN) 5-325 MG tablet Take 1 tablet by mouth 3 (three) times daily. Must last 30 days. 90 tablet 0   letrozole (FEMARA) 2.5 MG tablet Take 2.5 mg by mouth daily.     losartan (COZAAR) 50 MG tablet TAKE (2) TABLETS BY MOUTH ONCE DAILY. 60 tablet 11   lubiprostone (AMITIZA) 24 MCG capsule TAKE 1 CAPSULE BY MOUTH ONCE DAILY WITH BREAKFAST.     magnesium oxide (MAG-OX) 400 (240 Mg) MG tablet TAKE 1 TABLET BY MOUTH ONCE DAILY. 30 tablet 11   melatonin 1 MG TABS tablet Take 3 mg by mouth at bedtime.     metoprolol succinate (TOPROL-XL) 25 MG 24 hr tablet TAKE 1 TABLET BY MOUTH TWICE DAILY 60 tablet 11   mirabegron ER (MYRBETRIQ) 50 MG TB24 tablet Take 1 tablet (50 mg total) by mouth daily. 90 tablet 3   Multiple Vitamin (MULTIVITAMIN) tablet Take 1 tablet by mouth daily.     naloxone (NARCAN) nasal spray 4 mg/0.1 mL Place 1 spray into the nose as needed for up to 365 doses (for opioid-induced  respiratory depresssion). In case of emergency (overdose), spray once into each nostril. If no response within 3 minutes, repeat application and call [id number]. 1 each 0   Omega-3 1000 MG CAPS Take by mouth.     pantoprazole (PROTONIX) 40 MG tablet Take 1 tablet (40 mg total) by mouth daily. 30 tablet 11   rosuvastatin (CRESTOR) 20 MG tablet TAKE 1 TABLET BY MOUTH ONCE DAILY. 30 tablet 11   HYDROcodone-acetaminophen (NORCO/VICODIN) 5-325 MG tablet Take 1 tablet by mouth 3 (three) times daily. Must last 30 days. 90 tablet 0   HYDROcodone-acetaminophen (NORCO/VICODIN) 5-325 MG tablet Take 1 tablet by mouth 3 (  three) times daily. Must last 30 days. 90 tablet 0   nitrofurantoin, macrocrystal-monohydrate, (MACROBID) 100 MG capsule Take 1 capsule (100 mg total) by mouth daily. (Patient not taking: Reported on 02/13/2023) 90 capsule 3   No current facility-administered medications for this visit.    OBJECTIVE: Vitals:   02/13/23 1101  BP: (!) 141/68  Pulse: (!) 53  Resp: 18  Temp: 98.6 F (37 C)  SpO2: 100%     Body mass index is 32.61 kg/m.    ECOG FS:0 - Asymptomatic  General: Well-developed, well-nourished, no acute distress. Eyes: Pink conjunctiva, anicteric sclera. HEENT: Normocephalic, moist mucous membranes. Lungs: No audible wheezing or coughing. Heart: Regular rate and rhythm. Abdomen: Soft, nontender, no obvious distention. Musculoskeletal: No edema, cyanosis, or clubbing. Neuro: Alert, answering all questions appropriately. Cranial nerves grossly intact. Skin: No rashes or petechiae noted. Psych: Normal affect.  LAB RESULTS:  Lab Results  Component Value Date   NA 135 02/13/2023   K 3.8 02/13/2023   CL 100 02/13/2023   CO2 27 02/13/2023   GLUCOSE 85 02/13/2023   BUN 30 (H) 02/13/2023   CREATININE 1.48 (H) 02/13/2023   CALCIUM 9.0 02/13/2023   PROT 7.3 02/13/2023   ALBUMIN 4.0 02/13/2023   AST 40 02/13/2023   ALT 30 02/13/2023   ALKPHOS 27 (L) 02/13/2023   BILITOT 0.6  02/13/2023   GFRNONAA 34 (L) 02/13/2023   GFRAA >60 07/27/2020    Lab Results  Component Value Date   WBC 4.1 02/13/2023   NEUTROABS 2.1 02/13/2023   HGB 8.7 (L) 02/13/2023   HCT 27.1 (L) 02/13/2023   MCV 93.8 02/13/2023   PLT 230 02/13/2023     STUDIES: No results found.  ASSESSMENT: History of breast cancer, anemia unspecified.  PLAN:    Anemia: Patient's hemoglobin was noted to trend down below 9.0 with normal iron panel and B12.  Possibly related to her underlying renal insufficiency.  Full laboratory work-up from today is pending at time of dictation.  Patient has been instructed to keep her previously scheduled follow-up appointment for Zometa in November for further evaluation and discussion of her laboratory results. Osteoporosis: Patient's most recent bone mineral density on July 05, 2022 reported T score of -2.7 which is essentially unchanged from previous where her T score was -2.6 and -2.9 respectively.  Patient previously received Prolia, but this has been switched to Zometa secondary to insurance purposes.  Zometa is dose reduced secondary to her renal insufficiency.  Continue calcium and vitamin D supplementation.  Return to clinic as previously scheduled in November for continuation of treatment.  Repeat bone mineral density in July 2024. 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  Patient completed 5 years of letrozole in May 2019.  No further interventions are needed.  Patient has had bilateral mastectomies, therefore no further mammograms are necessary.  Return to clinic as scheduled for Prolia. Back/joint pain: Continue symptomatic treatment with pain clinic.   Patient expressed understanding and was in agreement with this plan. She also understands that She can call clinic at any time with any questions, concerns, or complaints.   Breast cancer  [facility])   Staging form: Breast, AJCC 7th Edition     Clinical stage from 09/22/2015: Stage IIA (T1c, N1, M0) - Signed by Lloyd Huger, MD on 09/22/2015   Lloyd Huger, MD   02/13/2023 11:29 AM

## 2023-02-14 ENCOUNTER — Encounter: Payer: Self-pay | Admitting: Oncology

## 2023-02-14 LAB — HEPATIC FUNCTION PANEL
ALT: 27 U/L (ref 0–35)
AST: 35 U/L (ref 0–37)
Albumin: 4.1 g/dL (ref 3.5–5.2)
Alkaline Phosphatase: 26 U/L — ABNORMAL LOW (ref 39–117)
Bilirubin, Direct: 0.1 mg/dL (ref 0.0–0.3)
Total Bilirubin: 0.3 mg/dL (ref 0.2–1.2)
Total Protein: 6.7 g/dL (ref 6.0–8.3)

## 2023-02-14 LAB — BASIC METABOLIC PANEL
BUN: 29 mg/dL — ABNORMAL HIGH (ref 6–23)
CO2: 29 mEq/L (ref 19–32)
Calcium: 9.5 mg/dL (ref 8.4–10.5)
Chloride: 100 mEq/L (ref 96–112)
Creatinine, Ser: 1.59 mg/dL — ABNORMAL HIGH (ref 0.40–1.20)
GFR: 29.48 mL/min — ABNORMAL LOW (ref >60.00)
Glucose, Bld: 80 mg/dL (ref 70–99)
Potassium: 4.2 mEq/L (ref 3.5–5.1)
Sodium: 137 mEq/L (ref 135–145)

## 2023-02-15 NOTE — Progress Notes (Signed)
PROVIDER NOTE: Information contained herein reflects review and annotations entered in association with encounter. Interpretation of such information and data should be left to medically-trained personnel. Information provided to patient can be located elsewhere in the medical record under "Patient Instructions". Document created using STT-dictation technology, any transcriptional errors that may result from process are unintentional.    Patient: Wanda Martinez  Service Category: E/M  Provider: Gaspar Cola, MD  DOB: 01-01-1935  DOS: 02/19/2023  Referring Provider: Einar Pheasant, MD  MRN: [id number]  Specialty: Interventional Pain Management  PCP: Einar Pheasant, MD  Type: Established Patient  Setting: Ambulatory outpatient    Location: Office  Delivery: Face-to-face     HPI  [name], a 88 y.o. year old female, is here today because of her Chronic bilateral low back pain without sciatica [M54.50, G89.29]. [name] primary complain today is Back Pain (lower) Last encounter: My last encounter with her was on 01/10/2023. Pertinent problems: [name] has Headache; Parkinson's disease; Chronic low back pain (1ry area of Pain) (Bilateral) (R>L) w/o sciatica; Lumbar spondylosis; Chronic hip pain (Bilateral); Chronic neck pain; Cervical spondylosis; Chronic cervical radicular pain (Right); Diffuse myofascial pain syndrome; Neurogenic pain; Chronic upper back pain (Right); Myofascial pain syndrome (Right) (cervicothoracic); Lumbar facet syndrome (Bilateral) (R>L); History of breast cancer; Cervical facet hypertrophy; Cervical facet syndrome ([place]); Chronic shoulder pain (Right); Chronic pain syndrome; Chronic sacroiliac joint pain (Left); Chronic sacroiliac joint pain (Right); 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, L4-5, L5-S1) (Right); Lumbar spinal stenosis (with neurogenic claudication) (L3-4); Chronic lower extremity pain (2ry area of Pain) (Right); Chronic lumbar radicular pain  (S1) (Right); Trochanteric bursitis of hip (Bilateral); Spondylosis without myelopathy or radiculopathy, lumbosacral region; Trigger point with back pain (Right); DDD (degenerative disc disease), lumbosacral; Chronic upper extremity pain (Right); Chronic thoracic back pain (Bilateral) (L>R); Trigger point of thoracic region (Bilateral) (L>R); Other specified dorsopathies, sacral and sacrococcygeal region; Sacroiliac joint dysfunction (Right); Osteoarthritis of sacroiliac joint (Right); Somatic dysfunction of sacroiliac joint (Right); Chronic musculoskeletal pain; Facial pain; Chronic hip pain (Right); Osteoarthritis of hip (Right); Left ear pain; Abnormal MRI, lumbar spine (08/23/2022); Osteoarthritis involving multiple joints; Other spondylosis, sacral and sacrococcygeal region; Lumbar facet hypertrophy (Multilevel) (Bilateral); Chronic knee pain (Right); Osteoarthritis of knee (Right); Trigger point of shoulder region (Right); Osteoarthritis of AC (acromioclavicular) joint (Right); Osteoarthritis of shoulder (Right); Unspecified injury of muscle(s) and tendon(s) of the rotator cuff of shoulder, sequela (Right); Cervicalgia; DDD (degenerative disc disease), lumbar; Lumbar radicular pain (Bilateral); Lumbar foraminal stenosis (Right: L3-4, L4-5, L5-S1) (Left: L2-3); Abnormal MRI, hip (Right) (05/04/2022); Abnormal MRI, knee (01/04/2022); and Tricompartment osteoarthritis of knee (Right) on their pertinent problem list. Pain Assessment: Severity of Chronic pain is reported as a 5 /10. Location: Back Mid, Lower/Denies. Onset: More than a month ago. Quality: Aching, Burning, Constant. Timing: Constant. Modifying factor(s): Meds, heat, laying on left side, pillow between her legs. Vitals:  height is '5\' 4"'$  (1.626 m) and weight is 190 lb (86.2 kg). Her temperature is 97.4 F (36.3 C) (abnormal). Her blood pressure is 107/50 (abnormal) and her pulse is 63. Her oxygen saturation is 100%.  BMI: Estimated body mass index  is 32.61 kg/m as calculated from the following:   Height as of this encounter: '5\' 4"'$  (1.626 m).   Weight as of this encounter: 190 lb (86.2 kg).  Reason for encounter: medication management.  The patient indicates doing well with the current medication regimen. No adverse reactions or side effects reported to the medications.  RTCB: 05/29/2023   The patient indicates having had a fall this morning where she seems to have hit her head.  I have examined the area and there is no evidence of redness, or skin abrasions.  She indicates that she did not lose consciousness she simply tripped on the rug.  She comes in today for medication management, but she also has been experiencing pain in the right side of the lower back.  She denies any pain on the left side.  She also denies any lower extremity pain, numbness or weakness that would suggest any type of radiculitis or radiculopathy.  The patient has had this pain for longer than 3 months and in fact we have been treating her intermittent recurrence of the symptoms since before 12/30/2015.  She has already tried oral steroids, over-the-counter medications such as nonsteroidal anti-inflammatory drugs, muscle relaxants, and a number of creams and patches.  She has also tried prescription medications such as NSAIDs, muscle relaxants, and opioid analgesics.  She is currently on a chronic regimen of hydrocodone/APAP 5/325 1 tablet p.o. every 8 hours as needed for the pain.  She describes having worsening of her kidney function and therefore she is really not a candidate for any further trials of NSAIDs.  The patient has already had all the appropriate imaging studies including a recent MRI of the lumbar spine done on 08/22/2022 which shows multilevel degenerative changes of the lumbar spine similar to 2021.  There is no high-grade spinal canal stenosis there is some degree of right foraminal narrowing at the L5-S1 level with left foraminal narrowing greatest at the  L2-3 level with some potential for exiting nerve root compression however, at that level the compressed nerve would be the L5 and the patient is not experiencing any pain, numbness, or weakness below the right buttocks area.  In the case of the left side, she is actually having absolutely no pain on that area.  Right Lumbar Facet Physical Examination Findings: Positive Kemp maneuver (Y)  Positive Lumbar Hyperextension and rotation maneuver (Y)   Right SI Physical Examination Findings: Positive Sacral Thrust (Sacral Spring, Downward Pressure): (Y) Positive FABER maneuver (Patrick's): (Y) Positive SI distraction (Gapping): (Y) Positive SI compression (Approximation): (Y/N) Positive Thigh Thrust:  (Y) Positive Gaenslen's: (Y) Positive Sacral Sulcus Tenderness: (Y)  Based on the above findings, the patient seems to be having a combination of a right lower lumbar facet and right sacroiliac joint arthropathy/arthralgia.  The 08/22/2022 Lumbar MRI confirms the patient to be having right greater than left facet joint arthropathy at the L4-5 and L5-S1 levels.  Taking into consideration the above history, physical exam, and imaging studies, I will be scheduling the patient for a right lumbar facet and SI joint injection under fluoroscopic guidance, no sedation.  Pharmacotherapy Assessment  Analgesic: Hydrocodone/APAP 5/325, 1 tab PO q 8 hrs (15 mg/day of hydrocodone) MME/day: 15 mg/day.   Monitoring: [place] PMP: PDMP reviewed during this encounter.       Pharmacotherapy: No side-effects or adverse reactions reported. Compliance: No problems identified. Effectiveness: Clinically acceptable.  Chauncey Fischer, RN  02/19/2023 10:47 AM  Sign when Signing Visit Nursing Pain Medication Assessment:  Safety precautions to be maintained throughout the outpatient stay will include: orient to surroundings, keep bed in low position, maintain call bell within reach at all times, provide assistance with transfer out  of bed and ambulation.  Medication Inspection Compliance: Pill count conducted under aseptic conditions, in front of the patient. Neither the pills nor  the bottle was removed from the patient's sight at any time. Once count was completed pills were immediately returned to the patient in their original bottle.  Medication: Hydrocodone/APAP Pill/Patch Count:  11 of 90 pills remain Pill/Patch Appearance: Markings consistent with prescribed medication Bottle Appearance: Standard pharmacy container. Clearly labeled. Filled Date: 1 / 24 / 2024 Last Medication intake:  TodaySafety precautions to be maintained throughout the outpatient stay will include: orient to surroundings, keep bed in low position, maintain call bell within reach at all times, provide assistance with transfer out of bed and ambulation.     No results found for: "CBDTHCR" No results found for: "D8THCCBX" No results found for: "D9THCCBX"  UDS:  Summary  Date Value Ref Range Status  06/12/2022 Note  Final    Comment:    ==================================================================== [employer] (MW) ==================================================================== Test                             Result       Flag       Units  Drug Present and Declared for Prescription Verification   Hydrocodone                    1766         EXPECTED   ng/mg creat   Dihydrocodeine                 303          EXPECTED   ng/mg creat   Norhydrocodone                 1597         EXPECTED   ng/mg creat    Sources of hydrocodone include scheduled prescription medications.    Dihydrocodeine and norhydrocodone are expected metabolites of    hydrocodone. Dihydrocodeine is also available as a scheduled    prescription medication.  ==================================================================== Test                      Result    Flag   Units      Ref Range   Creatinine              38               mg/dL       >=20 ==================================================================== Declared Medications:  The flagging and interpretation on this report are based on the  following declared medications.  Unexpected results may arise from  inaccuracies in the declared medications.   **Note: The testing scope of this panel includes these medications:   Hydrocodone (Norco)   **Note: The testing scope of this panel does not include the  following reported medications:   Acetaminophen (Norco)  Amlodipine  Aspirin  Calcium  Chondroitin  Citalopram (Celexa)  Cyclobenzaprine (Flexeril)  Donepezil (Aricept)  Gabapentin (Neurontin)  Glucosamine  Letrozole (Femara)  Losartan (Cozaar)  Lubiprostone (Amitiza)  Magnesium (Mag-Ox)  Melatonin  Meloxicam (Mobic)  Metoprolol  Multivitamin  Nitrofurantoin (Macrobid)  Omega-3 Fatty Acids  Omeprazole  Pantoprazole (Protonix)  Rosuvastatin (Crestor) ==================================================================== For clinical consultation, please call [phone]. ====================================================================       ROS  Constitutional: Denies any fever or chills Gastrointestinal: No reported hemesis, hematochezia, vomiting, or acute GI distress Musculoskeletal: Denies any acute onset joint swelling, redness, loss of ROM, or weakness Neurological: No reported episodes of acute onset apraxia, aphasia, dysarthria, agnosia, amnesia, paralysis, loss  of coordination, or loss of consciousness  Medication Review  Glucosamine Sulfate, Glucosamine-Chondroitin, HYDROcodone-acetaminophen, Omega-3, amLODipine, aspirin EC, calcium carbonate, citalopram, cyclobenzaprine, donepezil, erythromycin, gabapentin, hydrochlorothiazide, letrozole, losartan, lubiprostone, magnesium oxide, melatonin, metoprolol succinate, mirabegron ER, multivitamin, naloxone, pantoprazole, and rosuvastatin  History Review  Allergy: [name] is allergic  to sulfa antibiotics and vesicare [solifenacin]. Drug: [name]  reports no history of drug use. Alcohol:  reports no history of alcohol use. Tobacco:  reports that she has never smoked. She has never used smokeless tobacco. Social: [name]  reports that she has never smoked. She has never used smokeless tobacco. She reports that she does not drink alcohol and does not use drugs. Medical:  has a past medical history of Acute postoperative pain (10/25/2017), Anemia, Arm pain (07/26/2015), Arthritis, Arthritis, degenerative (03/26/2014), Back pain (11/01/2013), Bladder infection (06/2018), Breast cancer ([place]), Breast cancer ([place]), CHEST PAIN (04/29/2010), Chronic cystitis, Cystocele (02/22/2013), Cystocele, midline (08/19/2013), Degeneration of intervertebral disc of lumbosacral region (03/26/2014), DYSPNEA (04/29/2010), Enthesopathy of hip (03/26/2014), GERD (gastroesophageal reflux disease), Hiatal hernia, HTN (hypertension), Hypokalemia (06/2018), Hyponatremia (06/2018), LBP (low back pain) (03/26/2014), Neck pain (11/01/2013), Parkinson disease, Pneumonia (06/2018), Sinusitis (02/07/2015), Skin lesions (07/12/2014), and Urinary incontinence. Surgical: [name]  has a past surgical history that includes Tonsillectomy and adenoidectomy (1979); Vesicovaginal fistula closure w/ TAH (1983); Breast surgery (1986); Breast enhancement surgery (1987); Breast implant removal; Breast implant removal (Right, 08/29/2012); Mastectomy (08/2012); Abdominal hysterectomy; Colonoscopy with propofol (N/A, 09/13/2016); Breast biopsy (2013); and Blepharoplasty. Family: family history includes Colon polyps in her father; Diabetes in her father; Parkinson's disease in her mother; Stroke in her father and mother.  Laboratory Chemistry Profile   Renal Lab Results  Component Value Date   BUN 29 (H) 02/13/2023   CREATININE 1.59 (H) 02/13/2023   BCR 20 06/11/2020   GFR 29.48 (L) 02/13/2023   GFRAA >60 07/27/2020   GFRNONAA 34 (L)  02/13/2023    Hepatic Lab Results  Component Value Date   AST 35 02/13/2023   ALT 27 02/13/2023   ALBUMIN 4.1 02/13/2023   ALKPHOS 26 (L) 02/13/2023    Electrolytes Lab Results  Component Value Date   NA 137 02/13/2023   K 4.2 02/13/2023   CL 100 02/13/2023   CALCIUM 9.5 02/13/2023   MG 2.2 07/17/2018    Bone Lab Results  Component Value Date   VD25OH 66.95 09/26/2022   25OHVITD1 42 07/17/2018   25OHVITD2 <1.0 07/17/2018   25OHVITD3 42 07/17/2018    Inflammation (CRP: Acute Phase) (ESR: Chronic Phase) Lab Results  Component Value Date   CRP 3 07/17/2018   ESRSEDRATE 27 07/17/2018         Note: Above Lab results reviewed.  Recent Imaging Review  DG PAIN CLINIC C-ARM 1-60 MIN NO REPORT Fluoro was used, but no Radiologist interpretation will be provided.  Please refer to "NOTES" tab for provider progress note. Note: Reviewed        Physical Exam  General appearance: Well nourished, well developed, and well hydrated. In no apparent acute distress Mental status: Alert, oriented x 3 (person, place, & time)       Respiratory: No evidence of acute respiratory distress Eyes: PERLA Vitals: BP (!) 107/50   Pulse 63   Temp (!) 97.4 F (36.3 C)   Ht '5\' 4"'$  (1.626 m)   Wt 190 lb (86.2 kg)   LMP 12/18/1981   SpO2 100%   BMI 32.61 kg/m  BMI: Estimated body mass index is 32.61 kg/m as  calculated from the following:   Height as of this encounter: '5\' 4"'$  (1.626 m).   Weight as of this encounter: 190 lb (86.2 kg). Ideal: Ideal body weight: 54.7 kg (120 lb 9.5 oz) Adjusted ideal body weight: 67.3 kg (148 lb 5.7 oz)  Assessment   Diagnosis Status  1. Chronic low back pain (1ry area of Pain) (Bilateral) (R>L) w/o sciatica   2. Lumbar facet hypertrophy (Multilevel) (Bilateral)   3. Spondylosis without myelopathy or radiculopathy, lumbosacral region   4. DDD (degenerative disc disease), lumbar   5. Lumbar facet syndrome (Bilateral) (R>L)   6. Abnormal MRI, lumbar spine  (08/23/2022)   7. Osteoarthritis of sacroiliac joint (Right)   8. Chronic shoulder pain (Right)   9. Chronic knee pain (Right)   10. Unspecified injury of muscle(s) and tendon(s) of the rotator cuff of shoulder, sequela (Right)   11. Chronic pain syndrome   12. Pharmacologic therapy   13. Chronic use of opiate for therapeutic purpose   14. Encounter for medication management   15. Encounter for chronic pain management   16. Sacroiliac joint dysfunction (Right)   17. Somatic dysfunction of sacroiliac joint (Right)   18. Other specified dorsopathies, sacral and sacrococcygeal region   19. Chronic sacroiliac joint pain (Right)    Controlled Controlled Controlled   Updated Problems: No problems updated.  Plan of Care  Problem-specific:  No problem-specific Assessment & Plan notes found for this encounter.  [name] has a current medication list which includes the following long-term medication(s): amlodipine, calcium carbonate, citalopram, cyclobenzaprine, donepezil, gabapentin, hydrochlorothiazide, [START ON 02/28/2023] hydrocodone-acetaminophen, [START ON 03/30/2023] hydrocodone-acetaminophen, [START ON 04/29/2023] hydrocodone-acetaminophen, losartan, magnesium oxide, metoprolol succinate, mirabegron er, pantoprazole, and rosuvastatin.  Pharmacotherapy (Medications Ordered): Meds ordered this encounter  Medications   HYDROcodone-acetaminophen (NORCO/VICODIN) 5-325 MG tablet    Sig: Take 1 tablet by mouth 3 (three) times daily. Must last 30 days.    Dispense:  90 tablet    Refill:  0    DO NOT: delete (not duplicate); no partial-fill (will deny script to complete), no refill request (F/U required). DISPENSE: 1 day early if closed on fill date. WARN: No CNS-depressants within 8 hrs of med.   HYDROcodone-acetaminophen (NORCO/VICODIN) 5-325 MG tablet    Sig: Take 1 tablet by mouth 3 (three) times daily. Must last 30 days.    Dispense:  90 tablet    Refill:  0    DO NOT:  delete (not duplicate); no partial-fill (will deny script to complete), no refill request (F/U required). DISPENSE: 1 day early if closed on fill date. WARN: No CNS-depressants within 8 hrs of med.   HYDROcodone-acetaminophen (NORCO/VICODIN) 5-325 MG tablet    Sig: Take 1 tablet by mouth 3 (three) times daily. Must last 30 days.    Dispense:  90 tablet    Refill:  0    DO NOT: delete (not duplicate); no partial-fill (will deny script to complete), no refill request (F/U required). DISPENSE: 1 day early if closed on fill date. WARN: No CNS-depressants within 8 hrs of med.   Orders:  Orders Placed This Encounter  Procedures   LUMBAR FACET(MEDIAL BRANCH NERVE BLOCK) MBNB    Diagnosis: Lumbar Facet Syndrome (M47.816); Lumbosacral Facet Syndrome (M47.817); Lumbar Facet Joint Pain (M54.59) Medical Necessity Statement: 1.Severe chronic axial low back pain causing functional impairment documented by ongoing pain scale assessments. 2.Pain present for longer than 3 months (Chronic) documented to have failed noninvasive conservative therapies. 3.Absence of untreated radiculopathy. 4.There  is no radiological evidence of untreated fractures, tumor, infection, or deformity.  Physical Examination Findings: Positive Kemp Maneuver: (Y)  Positive Lumbar Hyperextension-Rotation provocative test: (Y)    Standing Status:   Future    Standing Expiration Date:   05/22/2023    Scheduling Instructions:     Procedure: Lumbar facet Medial Branch Block (MBB)     Side: Bilateral     Level: L3-4, L4-5, and L5-S1 Facets (L2, L3, L4, L5, and S1 Medial Branch)     Sedation: No Sedation.     Timeframe: ASAP    Order Specific Question:   Where will this procedure be performed?    Answer:   [facility]   SACROILIAC JOINT INJECTION    Physical Examination Findings: Positive Sacral Thrust (Sacral Spring, Downward Pressure): (Y) Positive FABER maneuver (Patrick's): (Y) Positive SI distraction (Gapping):  (Y) Positive SI compression (Approximation): (Y/N) Positive Thigh Thrust:  (Y) Positive Gaenslen's: (Y) Positive Sacral Sulcus Tenderness: (Y)    Standing Status:   Future    Standing Expiration Date:   05/22/2023    Scheduling Instructions:     Procedure: Sacroiliac Joint Injection     Side  Laterality: Right-sided     Sedation: No Sedation.     Timeframe: ASAP    Order Specific Question:   Where will this procedure be performed?    Answer:   [facility]   Follow-up plan:   Return for (Clinic): (R) L-FCT + (R) SI Blk.      Interventional Therapies  Risk Factors  Considerations:      Planned  Pending:   Therapeutic right SI joint injection #4 (1st of 2024)  Therapeutic right Lumbar Facet MBB #5 (1st of 2024)  Therapeutic right IA steroid knee injection #4 (01/10/2023)   Under consideration:   Therapeutic right SI joint injection #4  Diagnostic right genicular NB  Diagnostic bilateral cervical facet MBB  Therapeutic right L3 and L4 TFESI #1    Completed:   Therapeutic right superior/lateral trapezius muscle TPI x1 (04/19/2021)  Therapeutic right thoracic back TPI x2 (10/17/2018) (DKFUA)  Therapeutic left thoracic back TPI x1 (10/17/2018) (DKFUA)  Therapeutic right quadratus lumborum and erector spinae muscle TPI x1 (02/12/2018)  Therapeutic bilateral lumbar facet MBB (L2-S1) (FCT: L3-4,L4-5,L5-S1) x5 (08/09/2021) (100/100/50)  Therapeutic bilateral lumbar facet MBB (L4-S1) (FCT: L5-S1) x1 (08/29/2022) (100/100/100 x3 days/R:0L:100)  Therapeutic right lumbar facet MBB (L2-S1) x4 (12/21/2020) (DKFUA)  Therapeutic right lumbar facet RFA (L2-S1) x3 (09/28/2022) (100/100/75/75)  Therapeutic left lumbar facet RFA x2 (11/30/2022) (100/60/90/>75)  Diagnostic right SI joint Blk x3 (12/21/2020)  (DKFUA)  Diagnostic left SI joint Blk x1 (08/09/2017) (100/90/20/<25)  Therapeutic right SI joint RFA x3 (06/05/2019) (100/100/85/>50)  Therapeutic midline L2-3 LESI x1 (10/04/2017)  (100/100/85/>50)  Therapeutic right L3-4 LESI x2 (11/04/2020) (100/100/20/<50)  Therapeutic right L4-5 LESI x2 (03/30/2022) (100/100/75/75)  Therapeutic right L5-S1 LESI x2 (12/04/2019) (100/100/50/75)  Diagnostic/therapeutic right L3 and L4 TFESI x1 (07/27/2022) (100/100/75/50)  Therapeutic right L5 TFESI x4 (02/14/2022) (100/100/70/75)  Therapeutic right S1 TFESI x1 (11/04/2020) (100/100/20/<50)  Diagnostic bilateral superficial trochanteric bursa injec. x1 (04/30/2018) (DKFUA)  Diagnostic right IA hip injection x1 (02/13/2019) (DKFUA)  Diagnostic/therapeutic right IA steroid knee inj. x3 (08/15/2022) (100/100/100/80)  Therapeutic right ([place]) knee injection x1 (10/06/2021)  (DKFUA)  Therapeutic right ([place]) knee injection x1 (06/07/2021) (100/100/100/75)    Therapeutic  Palliative (PRN) options:   Therapeutic/palliative lumbar facet RFA  Therapeutic/palliative lumbar facet MBB  Therapeutic left L2 TFESI (this level is responsible for  her low back pain and left lower extremity pain.)   Pharmacotherapy  Nonopioids transfer 12/21/2020: Gabapentin       Recent Visits Date Type Provider Dept  01/10/23 Office Visit Milinda Pointer, MD [facility]  11/30/22 Procedure visit Milinda Pointer, MD [facility]  Showing recent visits within past 90 days and meeting all other requirements Today's Visits Date Type Provider Dept  02/19/23 Office Visit Milinda Pointer, MD [facility]  Showing today's visits and meeting all other requirements Future Appointments No visits were found meeting these conditions. Showing future appointments within next 90 days and meeting all other requirements  I discussed the assessment and treatment plan with the patient. The patient was provided an opportunity to ask questions and all were answered. The patient agreed with the plan and demonstrated an understanding of the instructions.  Patient advised to call back or  seek an in-person evaluation if the symptoms or condition worsens.  Duration of encounter: 35 minutes.  Total time on encounter, as per AMA guidelines included both the face-to-face and non-face-to-face time personally spent by the physician and/or other qualified health care professional(s) on the day of the encounter (includes time in activities that require the physician or other qualified health care professional and does not include time in activities normally performed by clinical staff). Physician's time may include the following activities when performed: Preparing to see the patient (e.g., pre-charting review of records, searching for previously ordered imaging, lab work, and nerve conduction tests) Review of prior analgesic pharmacotherapies. Reviewing PMP Interpreting ordered tests (e.g., lab work, imaging, nerve conduction tests) Performing post-procedure evaluations, including interpretation of diagnostic procedures Obtaining and/or reviewing separately obtained history Performing a medically appropriate examination and/or evaluation Counseling and educating the patient/family/caregiver Ordering medications, tests, or procedures Referring and communicating with other health care professionals (when not separately reported) Documenting clinical information in the electronic or other health record Independently interpreting results (not separately reported) and communicating results to the patient/ family/caregiver Care coordination (not separately reported)  Note by: Gaspar Cola, MD Date: 02/19/2023; Time: 12:06 PM

## 2023-02-16 ENCOUNTER — Telehealth: Payer: Self-pay

## 2023-02-16 DIAGNOSIS — R944 Abnormal results of kidney function studies: Secondary | ICD-10-CM

## 2023-02-16 NOTE — Addendum Note (Signed)
Addended by: Roetta Sessions D on: 02/16/2023 10:56 AM   Modules accepted: Orders

## 2023-02-16 NOTE — Telephone Encounter (Signed)
Patient returned office phone call and note from Dr Nicki Reaper was read. She would like a referral to nephrology.

## 2023-02-16 NOTE — Telephone Encounter (Signed)
Nephrology referral placed

## 2023-02-16 NOTE — Telephone Encounter (Signed)
Lm for pt to cb re : Einar Pheasant, MD  [facility] - kidney function overall improved from check a couple of weeks ago.  Still decreased.  Liver panel wnl.  Given decreased renal function - continue to avoid antiinflammatories.  I would also like to refer her to nephrology for further evaluation.  If agreeable - needs referral to [facility].

## 2023-02-19 ENCOUNTER — Ambulatory Visit: Payer: Medicare HMO | Attending: Pain Medicine | Admitting: Pain Medicine

## 2023-02-19 ENCOUNTER — Inpatient Hospital Stay: Payer: Medicare HMO | Attending: Oncology

## 2023-02-19 ENCOUNTER — Encounter: Payer: Self-pay | Admitting: Pain Medicine

## 2023-02-19 VITALS — BP 107/50 | HR 63 | Temp 97.4°F | Ht 64.0 in | Wt 190.0 lb

## 2023-02-19 VITALS — BP 118/56 | HR 69 | Temp 99.2°F | Resp 20

## 2023-02-19 DIAGNOSIS — M25511 Pain in right shoulder: Secondary | ICD-10-CM | POA: Diagnosis not present

## 2023-02-19 DIAGNOSIS — Z79891 Long term (current) use of opiate analgesic: Secondary | ICD-10-CM | POA: Diagnosis not present

## 2023-02-19 DIAGNOSIS — M533 Sacrococcygeal disorders, not elsewhere classified: Secondary | ICD-10-CM | POA: Diagnosis not present

## 2023-02-19 DIAGNOSIS — M461 Sacroiliitis, not elsewhere classified: Secondary | ICD-10-CM | POA: Diagnosis present

## 2023-02-19 DIAGNOSIS — M5388 Other specified dorsopathies, sacral and sacrococcygeal region: Secondary | ICD-10-CM | POA: Diagnosis present

## 2023-02-19 DIAGNOSIS — D631 Anemia in chronic kidney disease: Secondary | ICD-10-CM | POA: Insufficient documentation

## 2023-02-19 DIAGNOSIS — Z853 Personal history of malignant neoplasm of breast: Secondary | ICD-10-CM | POA: Insufficient documentation

## 2023-02-19 DIAGNOSIS — N186 End stage renal disease: Secondary | ICD-10-CM | POA: Diagnosis not present

## 2023-02-19 DIAGNOSIS — S46001S Unspecified injury of muscle(s) and tendon(s) of the rotator cuff of right shoulder, sequela: Secondary | ICD-10-CM | POA: Diagnosis not present

## 2023-02-19 DIAGNOSIS — G894 Chronic pain syndrome: Secondary | ICD-10-CM | POA: Diagnosis not present

## 2023-02-19 DIAGNOSIS — R937 Abnormal findings on diagnostic imaging of other parts of musculoskeletal system: Secondary | ICD-10-CM | POA: Diagnosis not present

## 2023-02-19 DIAGNOSIS — Z79899 Other long term (current) drug therapy: Secondary | ICD-10-CM | POA: Insufficient documentation

## 2023-02-19 DIAGNOSIS — M9904 Segmental and somatic dysfunction of sacral region: Secondary | ICD-10-CM | POA: Insufficient documentation

## 2023-02-19 DIAGNOSIS — M1909 Primary osteoarthritis, other specified site: Secondary | ICD-10-CM | POA: Diagnosis not present

## 2023-02-19 DIAGNOSIS — M545 Low back pain, unspecified: Secondary | ICD-10-CM | POA: Diagnosis not present

## 2023-02-19 DIAGNOSIS — M47816 Spondylosis without myelopathy or radiculopathy, lumbar region: Secondary | ICD-10-CM | POA: Diagnosis not present

## 2023-02-19 DIAGNOSIS — M5136 Other intervertebral disc degeneration, lumbar region: Secondary | ICD-10-CM | POA: Diagnosis not present

## 2023-02-19 DIAGNOSIS — M47817 Spondylosis without myelopathy or radiculopathy, lumbosacral region: Secondary | ICD-10-CM | POA: Diagnosis not present

## 2023-02-19 DIAGNOSIS — M25561 Pain in right knee: Secondary | ICD-10-CM | POA: Diagnosis not present

## 2023-02-19 DIAGNOSIS — G8929 Other chronic pain: Secondary | ICD-10-CM | POA: Diagnosis not present

## 2023-02-19 DIAGNOSIS — M810 Age-related osteoporosis without current pathological fracture: Secondary | ICD-10-CM | POA: Diagnosis not present

## 2023-02-19 MED ORDER — HYDROCODONE-ACETAMINOPHEN 5-325 MG PO TABS: 1.0000 | ORAL_TABLET | Freq: Three times a day (TID) | ORAL | 0 refills | Status: DC

## 2023-02-19 MED ORDER — EPOETIN ALFA-EPBX 40000 UNIT/ML IJ SOLN
40000.0000 [IU] | Freq: Once | INTRAMUSCULAR | Status: AC
  Administered 2023-02-19: 40000 [IU] via SUBCUTANEOUS
  Filled 2023-02-19: qty 1

## 2023-02-19 MED ORDER — ZOLEDRONIC ACID 4 MG/5ML IV CONC
3.5000 mg | Freq: Once | INTRAVENOUS | Status: AC
  Administered 2023-02-19: 3.5 mg via INTRAVENOUS
  Filled 2023-02-19: qty 4.38

## 2023-02-19 MED ORDER — SODIUM CHLORIDE 0.9 % IV SOLN
Freq: Once | INTRAVENOUS | Status: AC
  Filled 2023-02-19: qty 250

## 2023-02-19 NOTE — Patient Instructions (Signed)
Epoetin Alfa Injection What is this medication? EPOETIN ALFA (e POE e tin AL fa) treats low levels of red blood cells (anemia) caused by kidney disease, chemotherapy, or HIV medications. It can also be used in people who are at risk for blood loss during surgery. It works by helping your body make more red blood cells, which reduces the need for blood transfusions. This medicine may be used for other purposes; ask your health care provider or pharmacist if you have questions. COMMON BRAND NAME(S): Epogen, Procrit, Retacrit What should I tell my care team before I take this medication? They need to know if you have any of these conditions: Blood clots Cancer Heart disease High blood pressure On dialysis Seizures Stroke An unusual or allergic reaction to epoetin alfa, albumin, benzyl alcohol, other medications, foods, dyes, or preservatives Pregnant or trying to get pregnant Breast-feeding How should I use this medication? This medication is injected into a vein or under the skin. It is usually given by your care team in a hospital or clinic setting. It may also be given at home. If you get this medication at home, you will be taught how to prepare and give it. Use exactly as directed. Take it as directed on the prescription label at the same time every day. Keep taking it unless your care team tells you to stop. It is important that you put your used needles and syringes in a special sharps container. Do not put them in a trash can. If you do not have a sharps container, call your pharmacist or care team to get one. A special MedGuide will be given to you by the pharmacist with each prescription and refill. Be sure to read this information carefully each time. Talk to your care team about the use of this medication in children. While this medication may be used in children as young as 1 month of age for selected conditions, precautions do apply. Overdosage: If you think you have taken too much  of this medicine contact a poison control center or emergency room at once. NOTE: This medicine is only for you. Do not share this medicine with others. What if I miss a dose? If you miss a dose, take it as soon as you can. If it is almost time for your next dose, take only that dose. Do not take double or extra doses. What may interact with this medication? Darbepoetin alfa Methoxy polyethylene glycol-epoetin beta This list may not describe all possible interactions. Give your health care provider a list of all the medicines, herbs, non-prescription drugs, or dietary supplements you use. Also tell them if you smoke, drink alcohol, or use illegal drugs. Some items may interact with your medicine. What should I watch for while using this medication? Visit your care team for regular checks on your progress. Check your blood pressure as directed. Know what your blood pressure should be and when to contact your care team. Your condition will be monitored carefully while you are receiving this medication. You may need blood work while taking this medication. What side effects may I notice from receiving this medication? Side effects that you should report to your care team as soon as possible: Allergic reactions--skin rash, itching, hives, swelling of the face, lips, tongue, or throat Blood clot--pain, swelling, or warmth in the leg, shortness of breath, chest pain Heart attack--pain or tightness in the chest, shoulders, arms, or jaw, nausea, shortness of breath, cold or clammy skin, feeling faint or lightheaded Increase   in blood pressure Rash, fever, and swollen lymph nodes Redness, blistering, peeling, or loosening of the skin, including inside the mouth Seizures Stroke--sudden numbness or weakness of the face, arm, or leg, trouble speaking, confusion, trouble walking, loss of balance or coordination, dizziness, severe headache, change in vision Side effects that usually do not require medical  attention (report to your care team if they continue or are bothersome): Bone, joint, or muscle pain Cough Headache Nausea Pain, redness, or irritation at injection site This list may not describe all possible side effects. Call your doctor for medical advice about side effects. You may report side effects to FDA at 1-800-FDA-1088. Where should I keep my medication? Keep out of the reach of children and pets. Store in a refrigerator. Do not freeze. Do not shake. Protect from light. Keep this medication in the original container until you are ready to take it. See product for storage information. Get rid of any unused medication after the expiration date. To get rid of medications that are no longer needed or have expired: Take the medication to a medication take-back program. Check with your pharmacy or law enforcement to find a location. If you cannot return the medication, ask your pharmacist or care team how to get rid of the medication safely. NOTE: This sheet is a summary. It may not cover all possible information. If you have questions about this medicine, talk to your doctor, pharmacist, or health care provider.  2023 Elsevier/Gold Standard (2022-03-14 00:00:00)   Zoledronic Acid Injection (Cancer) What is this medication? ZOLEDRONIC ACID (ZOE le dron ik AS id) treats high calcium levels in the blood caused by cancer. It may also be used with chemotherapy to treat weakened bones caused by cancer. It works by slowing down the release of calcium from bones. This lowers calcium levels in your blood. It also makes your bones stronger and less likely to break (fracture). It belongs to a group of medications called bisphosphonates. This medicine may be used for other purposes; ask your health care provider or pharmacist if you have questions. COMMON BRAND NAME(S): Zometa, Zometa Powder What should I tell my care team before I take this medication? They need to know if you have any of these  conditions: Dehydration Dental disease Kidney disease Liver disease Low levels of calcium in the blood Lung or breathing disease, such as asthma Receiving steroids, such as dexamethasone or prednisone An unusual or allergic reaction to zoledronic acid, other medications, foods, dyes, or preservatives Pregnant or trying to get pregnant Breast-feeding How should I use this medication? This medication is injected into a vein. It is given by your care team in a hospital or clinic setting. Talk to your care team about the use of this medication in children. Special care may be needed. Overdosage: If you think you have taken too much of this medicine contact a poison control center or emergency room at once. NOTE: This medicine is only for you. Do not share this medicine with others. What if I miss a dose? Keep appointments for follow-up doses. It is important not to miss your dose. Call your care team if you are unable to keep an appointment. What may interact with this medication? Certain antibiotics given by injection Diuretics, such as bumetanide, furosemide NSAIDs, medications for pain and inflammation, such as ibuprofen or naproxen Teriparatide Thalidomide This list may not describe all possible interactions. Give your health care provider a list of all the medicines, herbs, non-prescription drugs, or dietary supplements you  use. Also tell them if you smoke, drink alcohol, or use illegal drugs. Some items may interact with your medicine. What should I watch for while using this medication? Visit your care team for regular checks on your progress. It may be some time before you see the benefit from this medication. Some people who take this medication have severe bone, joint, or muscle pain. This medication may also increase your risk for jaw problems or a broken thigh bone. Tell your care team right away if you have severe pain in your jaw, bones, joints, or muscles. Tell you care team if  you have any pain that does not go away or that gets worse. Tell your dentist and dental surgeon that you are taking this medication. You should not have major dental surgery while on this medication. See your dentist to have a dental exam and fix any dental problems before starting this medication. Take good care of your teeth while on this medication. Make sure you see your dentist for regular follow-up appointments. You should make sure you get enough calcium and vitamin D while you are taking this medication. Discuss the foods you eat and the vitamins you take with your care team. Check with your care team if you have severe diarrhea, nausea, and vomiting, or if you sweat a lot. The loss of too much body fluid may make it dangerous for you to take this medication. You may need bloodwork while taking this medication. Talk to your care team if you wish to become pregnant or think you might be pregnant. This medication can cause serious birth defects. What side effects may I notice from receiving this medication? Side effects that you should report to your care team as soon as possible: Allergic reactions--skin rash, itching, hives, swelling of the face, lips, tongue, or throat Kidney injury--decrease in the amount of urine, swelling of the ankles, hands, or feet Low calcium level--muscle pain or cramps, confusion, tingling, or numbness in the hands or feet Osteonecrosis of the jaw--pain, swelling, or redness in the mouth, numbness of the jaw, poor healing after dental work, unusual discharge from the mouth, visible bones in the mouth Severe bone, joint, or muscle pain Side effects that usually do not require medical attention (report to your care team if they continue or are bothersome): Constipation Fatigue Fever Loss of appetite Nausea Stomach pain This list may not describe all possible side effects. Call your doctor for medical advice about side effects. You may report side effects to FDA  at 1-800-FDA-1088. Where should I keep my medication? This medication is given in a hospital or clinic. It will not be stored at home. NOTE: This sheet is a summary. It may not cover all possible information. If you have questions about this medicine, talk to your doctor, pharmacist, or health care provider.  2023 Elsevier/Gold Standard (2008-01-25 00:00:00)

## 2023-02-19 NOTE — Progress Notes (Signed)
Nursing Pain Medication Assessment:  Safety precautions to be maintained throughout the outpatient stay will include: orient to surroundings, keep bed in low position, maintain call bell within reach at all times, provide assistance with transfer out of bed and ambulation.  Medication Inspection Compliance: Pill count conducted under aseptic conditions, in front of the patient. Neither the pills nor the bottle was removed from the patient's sight at any time. Once count was completed pills were immediately returned to the patient in their original bottle.  Medication: Hydrocodone/APAP Pill/Patch Count:  11 of 90 pills remain Pill/Patch Appearance: Markings consistent with prescribed medication Bottle Appearance: Standard pharmacy container. Clearly labeled. Filled Date: 1 / 20 / 2024 Last Medication intake:  TodaySafety precautions to be maintained throughout the outpatient stay will include: orient to surroundings, keep bed in low position, maintain call bell within reach at all times, provide assistance with transfer out of bed and ambulation.

## 2023-02-19 NOTE — Patient Instructions (Addendum)
______________________________________________________________________  Procedure instructions  Do not eat or drink fluids (other than water) for 6 hours before your procedure  No water for 2 hours before your procedure  Take your blood pressure medicine with a sip of water  Arrive 30 minutes before your appointment  Carefully read the "Preparing for your procedure" detailed instructions  If you have questions call us at [phone]  _____________________________________________________________________    ______________________________________________________________________  Preparing for your procedure  Appointments: If you think you may not be able to keep your appointment, call 24-48 hours in advance to cancel. We need time to make it available to others.  During your procedure appointment there will be: No Prescription Refills. No disability issues to discussed. No medication changes or discussions.  Instructions: Food intake: Avoid eating anything solid for at least 8 hours prior to your procedure. Clear liquid intake: You may take clear liquids such as water up to 2 hours prior to your procedure. (No carbonated drinks. No soda.) Transportation: Unless otherwise stated by your physician, bring a driver. Morning Medicines: Except for blood thinners, take all of your other morning medications with a sip of water. Make sure to take your heart and blood pressure medicines. If your blood pressure's lower number is above 100, the case will be rescheduled. Blood thinners: Make sure to stop your blood thinners as instructed.  If you take a blood thinner, but were not instructed to stop it, call our office [phone] and ask to talk to a nurse. Not stopping a blood thinner prior to certain procedures could lead to serious complications. Diabetics on insulin: Notify the staff so that you can be scheduled 1st case in the morning. If your diabetes requires high dose insulin,  take only  of your normal insulin dose the morning of the procedure and notify the staff that you have done so. Preventing infections: Shower with an antibacterial soap the morning of your procedure.  Build-up your immune system: Take 1000 mg of Vitamin C with every meal (3 times a day) the day prior to your procedure. Antibiotics: Inform the nursing staff if you are taking any antibiotics or if you have any conditions that may require antibiotics prior to procedures. (Example: recent joint implants)   Pregnancy: If you are pregnant make sure to notify the nursing staff. Not doing so may result in injury to the fetus, including death.  Sickness: If you have a cold, fever, or any active infections, call and cancel or reschedule your procedure. Receiving steroids while having an infection may result in complications. Arrival: You must be in the facility at least 30 minutes prior to your scheduled procedure. Tardiness: Your scheduled time is also the cutoff time. If you do not arrive at least 15 minutes prior to your procedure, you will be rescheduled.  Children: Do not bring any children with you. Make arrangements to keep them home. Dress appropriately: There is always a possibility that your clothing may get soiled. Avoid long dresses. Valuables: Do not bring any jewelry or valuables.  Reasons to call and reschedule or cancel your procedure: (Following these recommendations will minimize the risk of a serious complication.) Surgeries: Avoid having procedures within 2 weeks of any surgery. (Avoid for 2 weeks before or after any surgery). Flu Shots: Avoid having procedures within 2 weeks of a flu shots or . (Avoid for 2 weeks before or after immunizations). Barium: Avoid having a procedure within 7-10 days after having had a radiological study involving the use of  radiological contrast. (Myelograms, Barium swallow or enema study). Heart attacks: Avoid any elective procedures or surgeries for the  initial 6 months after a "Myocardial Infarction" (Heart Attack). Blood thinners: It is imperative that you stop these medications before procedures. Let us know if you if you take any blood thinner.  Infection: Avoid procedures during or within two weeks of an infection (including chest colds or gastrointestinal problems). Symptoms associated with infections include: Localized redness, fever, chills, night sweats or profuse sweating, burning sensation when voiding, cough, congestion, stuffiness, runny nose, sore throat, diarrhea, nausea, vomiting, cold or Flu symptoms, recent or current infections. It is specially important if the infection is over the area that we intend to treat. Heart and lung problems: Symptoms that may suggest an active cardiopulmonary problem include: cough, chest pain, breathing difficulties or shortness of breath, dizziness, ankle swelling, uncontrolled high or unusually low blood pressure, and/or palpitations. If you are experiencing any of these symptoms, cancel your procedure and contact your primary care physician for an evaluation.  Remember:  Regular Business hours are:  Monday to Thursday 8:00 AM to 4:00 PM  Provider's Schedule: Milinda Pointer, MD:  Procedure days: Tuesday and Thursday 7:30 AM to 4:00 PM  Gillis Santa, MD:  Procedure days: Monday and Wednesday 7:30 AM to 4:00 PM  ______________________________________________________________________    ____________________________________________________________________________________________  General Risks and Possible Complications  Patient Responsibilities: It is important that you read this as it is part of your informed consent. It is our duty to inform you of the risks and possible complications associated with treatments offered to you. It is your responsibility as a patient to read this and to ask questions about anything that is not clear or that you believe was not covered in this  document.  Patient's Rights: You have the right to refuse treatment. You also have the right to change your mind, even after initially having agreed to have the treatment done. However, under this last option, if you wait until the last second to change your mind, you may be charged for the materials used up to that point.  Introduction: Medicine is not an Chief Strategy Officer. Everything in Medicine, including the lack of treatment(s), carries the potential for danger, harm, or loss (which is by definition: Risk). In Medicine, a complication is a secondary problem, condition, or disease that can aggravate an already existing one. All treatments carry the risk of possible complications. The fact that a side effects or complications occurs, does not imply that the treatment was conducted incorrectly. It must be clearly understood that these can happen even when everything is done following the highest safety standards.  No treatment: You can choose not to proceed with the proposed treatment alternative. The "PRO(s)" would include: avoiding the risk of complications associated with the therapy. The "CON(s)" would include: not getting any of the treatment benefits. These benefits fall under one of three categories: diagnostic; therapeutic; and/or palliative. Diagnostic benefits include: getting information which can ultimately lead to improvement of the disease or symptom(s). Therapeutic benefits are those associated with the successful treatment of the disease. Finally, palliative benefits are those related to the decrease of the primary symptoms, without necessarily curing the condition (example: decreasing the pain from a flare-up of a chronic condition, such as incurable terminal cancer).  General Risks and Complications: These are associated to most interventional treatments. They can occur alone, or in combination. They fall under one of the following six (6) categories: no benefit or worsening of symptoms;  bleeding; infection; nerve damage; allergic reactions; and/or death. No benefits or worsening of symptoms: In Medicine there are no guarantees, only probabilities. No healthcare provider can ever guarantee that a medical treatment will work, they can only state the probability that it may. Furthermore, there is always the possibility that the condition may worsen, either directly, or indirectly, as a consequence of the treatment. Bleeding: This is more common if the patient is taking a blood thinner, either prescription or over the counter (example: Goody Powders, Fish oil, Aspirin, Garlic, etc.), or if suffering a condition associated with impaired coagulation (example: Hemophilia, cirrhosis of the liver, low platelet counts, etc.). However, even if you do not have one on these, it can still happen. If you have any of these conditions, or take one of these drugs, make sure to notify your treating physician. Infection: This is more common in patients with a compromised immune system, either due to disease (example: diabetes, cancer, human immunodeficiency virus [HIV], etc.), or due to medications or treatments (example: therapies used to treat cancer and rheumatological diseases). However, even if you do not have one on these, it can still happen. If you have any of these conditions, or take one of these drugs, make sure to notify your treating physician. Nerve Damage: This is more common when the treatment is an invasive one, but it can also happen with the use of medications, such as those used in the treatment of cancer. The damage can occur to small secondary nerves, or to large primary ones, such as those in the spinal cord and brain. This damage may be temporary or permanent and it may lead to impairments that can range from temporary numbness to permanent paralysis and/or brain death. Allergic Reactions: Any time a substance or material comes in contact with our body, there is the possibility of an  allergic reaction. These can range from a mild skin rash (contact dermatitis) to a severe systemic reaction (anaphylactic reaction), which can result in death. Death: In general, any medical intervention can result in death, most of the time due to an unforeseen complication. ____________________________________________________________________________________________    ____________________________________________________________________________________________  Patient Information update  To: All of our patients.  Re: Name change.  It has been made official that our current name, "[facility]"   will soon be changed to "[facility]".   The purpose of this change is to eliminate any confusion created by the concept of our practice being a "Medication Management Pain [facility]". In the past this has led to the misconception that we treat pain primarily by the use of prescription medications.  Nothing can be farther from the truth.   Understanding PAIN MANAGEMENT: To further understand what our practice does, you first have to understand that "Pain Management" is a subspecialty that requires additional training once a physician has completed their specialty training, which can be in either Anesthesia, Neurology, Psychiatry, or Physical Medicine and Rehabilitation (PMR). Each one of these contributes to the final approach taken by each physician to the management of their patient's pain. To be a "Pain Management Specialist" you must have first completed one of the specialty trainings below.  Anesthesiologists - trained in clinical pharmacology and interventional techniques such as nerve blockade and regional as well as central neuroanatomy. They are trained to block pain before, during, and after surgical interventions.  Neurologists - trained in the diagnosis and pharmacological treatment of  complex neurological conditions, such  as Multiple Sclerosis, Parkinson's, spinal cord injuries, and other systemic conditions that may be associated with symptoms that may include but are not limited to pain. They tend to rely primarily on the treatment of chronic pain using prescription medications.  Psychiatrist - trained in conditions affecting the psychosocial wellbeing of patients including but not limited to depression, anxiety, schizophrenia, personality disorders, addiction, and other substance use disorders that may be associated with chronic pain. They tend to rely primarily on the treatment of chronic pain using prescription medications.   Physical Medicine and Rehabilitation (PMR) physicians, also known as physiatrists - trained to treat a wide variety of medical conditions affecting the brain, spinal cord, nerves, bones, joints, ligaments, muscles, and tendons. Their training is primarily aimed at treating patients that have suffered injuries that have caused severe physical impairment. Their training is primarily aimed at the physical therapy and rehabilitation of those patients. They may also work alongside orthopedic surgeons or neurosurgeons using their expertise in assisting surgical patients to recover after their surgeries.  INTERVENTIONAL PAIN MANAGEMENT is sub-subspecialty of Pain Management.  Our physicians are Board-certified in Anesthesia, Pain Management, and Interventional Pain Management.  This meaning that not only have they been trained and Board-certified in their specialty of Anesthesia, and subspecialty of Pain Management, but they have also received further training in the sub-subspecialty of Interventional Pain Management, in order to become Board-certified as INTERVENTIONAL PAIN MANAGEMENT SPECIALIST.    Mission: Our goal is to use our skills in  [place] as alternatives to the chronic use of prescription opioid medications for the treatment of pain.  To make this more clear, we have changed our name to reflect what we do and offer. We will continue to offer medication management assessment and recommendations, but we will not be taking over any patient's medication management.  ____________________________________________________________________________________________     ____________________________________________________________________________________________  Opioid Pain Medication Update  To: All patients taking opioid pain medications. (I.e.: hydrocodone, hydromorphone, oxycodone, oxymorphone, morphine, codeine, methadone, tapentadol, tramadol, buprenorphine, fentanyl, etc.)  Re: Updated review of side effects and adverse reactions of opioid analgesics, as well as new information about long term effects of this class of medications.  Direct risks of long-term opioid therapy are not limited to opioid addiction and overdose. Potential medical risks include serious fractures, breathing problems during sleep, hyperalgesia, immunosuppression, chronic constipation, bowel obstruction, myocardial infarction, and tooth decay secondary to xerostomia.  Unpredictable adverse effects that can occur even if you take your medication correctly: Cognitive impairment, respiratory depression, and death. Most people think that if they take their medication "correctly", and "as instructed", that they will be safe. Nothing could be farther from the truth. In reality, a significant amount of recorded deaths associated with the use of opioids has occurred in individuals that had taken the medication for a long time, and were taking their medication correctly. The following are examples of how this can happen: Patient taking his/her medication for a long time, as instructed, without any side effects, is given a certain antibiotic or another unrelated medication, which in turn triggers a "Drug-to-drug interaction" leading to disorientation, cognitive impairment,  impaired reflexes, respiratory depression or an untoward event leading to serious bodily harm or injury, including death.  Patient taking his/her medication for a long time, as instructed, without any side effects, develops an acute impairment of liver and/or kidney function. This will lead to a rapid inability of the body to breakdown and eliminate their pain medication, which will result in effects similar  to an "overdose", but with the same medicine and dose that they had always taken. This again may lead to disorientation, cognitive impairment, impaired reflexes, respiratory depression or an untoward event leading to serious bodily harm or injury, including death.  A similar problem will occur with patients as they grow older and their liver and kidney function begins to decrease as part of the aging process.  Background information: Historically, the original case for using long-term opioid therapy to treat chronic noncancer pain was based on safety assumptions that subsequent experience has called into question. In 1996, the American Pain Society and the [place] [employer] issued a consensus statement supporting long-term opioid therapy. This statement acknowledged the dangers of opioid prescribing but concluded that the risk for addiction was low; respiratory depression induced by opioids was short-lived, occurred mainly in opioid-naive patients, and was antagonized by pain; tolerance was not a common problem; and efforts to control diversion should not constrain opioid prescribing. This has now proven to be wrong. Experience regarding the risks for opioid addiction, misuse, and overdose in community practice has failed to support these assumptions.  According to the Centers for Disease Control and Prevention, fatal overdoses involving opioid analgesics have increased sharply over the past decade. Currently, more than 96,700 people die from drug overdoses every year. Opioids are a factor  in 7 out of every 10 overdose deaths. Deaths from drug overdose have surpassed motor vehicle accidents as the leading cause of death for individuals between the ages of 37 and 101.  Clinical data suggest that neuroendocrine dysfunction may be very common in both men and women, potentially causing hypogonadism, erectile dysfunction, infertility, decreased libido, osteoporosis, and depression. Recent studies linked higher opioid dose to increased opioid-related mortality. Controlled observational studies reported that long-term opioid therapy may be associated with increased risk for cardiovascular events. Subsequent meta-analysis concluded that the safety of long-term opioid therapy in elderly patients has not been proven.   Side Effects and adverse reactions: Common side effects: Drowsiness (sedation). Dizziness. Nausea and vomiting. Constipation. Physical dependence -- Dependence often manifests with withdrawal symptoms when opioids are discontinued or decreased. Tolerance -- As you take repeated doses of opioids, you require increased medication to experience the same effect of pain relief. Respiratory depression -- This can occur in healthy people, especially with higher doses. However, people with COPD, asthma or other lung conditions may be even more susceptible to fatal respiratory impairment.  Uncommon side effects: An increased sensitivity to feeling pain and extreme response to pain (hyperalgesia). Chronic use of opioids can lead to this. Delayed gastric emptying (the process by which the contents of your stomach are moved into your small intestine). Muscle rigidity. Immune system and hormonal dysfunction. Quick, involuntary muscle jerks (myoclonus). Arrhythmia. Itchy skin (pruritus). Dry mouth (xerostomia).  Long-term side effects: Chronic constipation. Sleep-disordered breathing (SDB). Increased risk of bone fractures. Hypothalamic-pituitary-adrenal dysregulation. Increased  risk of overdose.  RISKS: Fractures and Falls:  Opioids increase the risk and incidence of falls. This is of particular importance in elderly patients.  Endocrine System:  Long-term administration is associated with endocrine abnormalities (endocrinopathies). (Also known as Opioid-induced Endocrinopathy) Influences on both the hypothalamic-pituitary-adrenal axis?and the hypothalamic-pituitary-gonadal axis have been demonstrated with consequent hypogonadism and adrenal insufficiency in both sexes. Hypogonadism and decreased levels of dehydroepiandrosterone sulfate have been reported in men and women. Endocrine effects include: Amenorrhoea in women (abnormal absence of menstruation) Reduced libido in both sexes Decreased sexual function Erectile dysfunction in men Hypogonadisms (decreased testicular function  with shrinkage of testicles) Infertility Depression and fatigue Loss of muscle mass Anxiety Depression Immune suppression Hyperalgesia Weight gain Anemia Osteoporosis Patients (particularly women of childbearing age) should avoid opioids. There is insufficient evidence to recommend routine monitoring of asymptomatic patients taking opioids in the long-term for hormonal deficiencies.  Immune System: Human studies have demonstrated that opioids have an immunomodulating effect. These effects are mediated via opioid receptors both on immune effector cells and in the central nervous system. Opioids have been demonstrated to have adverse effects on antimicrobial response and anti-tumour surveillance. Buprenorphine has been demonstrated to have no impact on immune function.  Opioid Induced Hyperalgesia: Human studies have demonstrated that prolonged use of opioids can lead to a state of abnormal pain sensitivity, sometimes called opioid induced hyperalgesia (OIH). Opioid induced hyperalgesia is not usually seen in the absence of tolerance to opioid analgesia. Clinically, hyperalgesia  may be diagnosed if the patient on long-term opioid therapy presents with increased pain. This might be qualitatively and anatomically distinct from pain related to disease progression or to breakthrough pain resulting from development of opioid tolerance. Pain associated with hyperalgesia tends to be more diffuse than the pre-existing pain and less defined in quality. Management of opioid induced hyperalgesia requires opioid dose reduction.  Cancer: Chronic opioid therapy has been associated with an increased risk of cancer among noncancer patients with chronic pain. This association was more evident in chronic strong opioid users. Chronic opioid consumption causes significant pathological changes in the small intestine and colon. Epidemiological studies have found that there is a link between opium dependence and initiation of gastrointestinal cancers. Cancer is the second leading cause of death after cardiovascular disease. Chronic use of opioids can cause multiple conditions such as GERD, immunosuppression and renal damage as well as carcinogenic effects, which are associated with the incidence of cancers.   Mortality: Long-term opioid use has been associated with increased mortality among patients with chronic non-cancer pain (CNCP).  Prescription of long-acting opioids for chronic noncancer pain was associated with a significantly increased risk of all-cause mortality, including deaths from causes other than overdose.  Reference: Von Korff M, Kolodny A, Deyo RA, Chou R. Long-term opioid therapy reconsidered. Ann Intern Med. 2011 Sep 6;155(5):325-8. doi: 10.7326/[id number]. PMID: [id number]; PMCIDXX:[id number]. Morley Kos, Hayward RA, Dunn KM, [place] KP. Risk of adverse events in patients prescribed long-term opioids: A cohort study in the [place] [facility] [employer]. Eur J Pain. 2019 May;23(5):908-922. doi: 10.1002/ejp.1357. Epub 2019 Jan 31. PMID:  [id number]. Colameco S, Coren JS, [place] CA. Continuous opioid treatment for chronic noncancer pain: a time for moderation in prescribing. Postgrad Med. 2009 Jul;121(4):61-6. doi: 10.3810/pgm.2009.07.2032. PMID: [id number]. Heywood Bene RN, [place], Blazina I, Rosalio Loud, Bougatsos C, Deyo RA. The effectiveness and risks of long-term opioid therapy for chronic pain: a systematic review for a [employer] of [employer] to [employer]. Ann Intern Med. 2015 Feb 17;162(4):276-86. doi: [id number]. PMID: [id number]. Marjory Sneddon [facility], [place] DM. NCHS Data Brief No. 22. [place]: Centers for Disease Control and Prevention; 2009. Sep, Increase in Fatal Poisonings Involving Opioid Analgesics in the [place], 1999-2006. Song IA, Choi HR, Oh TK. Long-term opioid use and mortality in patients with chronic non-cancer pain: Ten-year follow-up study in [place] from 2010 through 2019. EClinicalMedicine. 2022 Jul 18;51:101558. doi: [id number]/[id number]. PMID: [id number]; PMCIDOX:8550940. Huser, WEllene Route, T., Vogelmann, T. et al. All-cause mortality in patients with long-term opioid therapy compared with non-opioid analgesics for  chronic non-cancer pain: a database study. [facility] 18, 162 (2020). [email or web address] Rashidian H, Roxy Cedar, Malekzadeh R, Haghdoost AA. An Ecological Study of the Association between Opiate Use and Incidence of Cancers. Addict Health. 2016 Fall;8(4):252-260. PMID: [id number]; PMCIDQI:9185013.  Our Goal: Our goal is to control your pain with means other than the use of opioid pain medications.  Our Recommendation: Talk to your physician about coming off of these medications. We can assist you with the tapering down and stopping these medicines. Based on the new information, even if you cannot completely stop the medication, a decrease in the dose may be associated with a lesser risk. Ask for  other means of controlling the pain. Decrease or eliminate those factors that significantly contribute to your pain such as smoking, obesity, and a diet heavily tilted towards "inflammatory" nutrients.  Last Updated: 02/14/2023   ____________________________________________________________________________________________     ____________________________________________________________________________________________  National Pain Medication Shortage  The [place] is experiencing worsening drug shortages. These have had a negative widespread effect on patient care and treatment. Not expected to improve any time soon. Predicted to last past 2029.   Drug shortage list (generic names) Oxycodone IR Oxycodone/APAP Oxymorphone IR Hydromorphone Hydrocodone/APAP Morphine  Where is the problem?  Manufacturing and supply level.  Will this shortage affect you?  Only if you take any of the above pain medications.  How? You may be unable to fill your prescription.  Your pharmacist may offer a "partial fill" of your prescription. (Warning: Do not accept partial fills.) Prescriptions partially filled cannot be transferred to another pharmacy. Read our Medication Rules and Regulation. Depending on how much medicine you are dependent on, you may experience withdrawals when unable to get the medication.  Recommendations: Consider ending your dependence on opioid pain medications. Ask your pain specialist to assist you with the process. Consider switching to a medication currently not in shortage, such as Buprenorphine. Talk to your pain specialist about this option. Consider decreasing your pain medication requirements by managing tolerance thru "Drug Holidays". This may help minimize withdrawals, should you run out of medicine. Control your pain thru the use of non-pharmacological interventional therapies.   Your prescriber: Prescribers cannot be blamed for shortages. Medication manufacturing and  supply issues cannot be fixed by the prescriber.   NOTE: The prescriber is not responsible for supplying the medication, or solving supply issues. Work with your pharmacist to solve it. The patient is responsible for the decision to take or continue taking the medication and for identifying and securing a legal supply source. By law, supplying the medication is the job and responsibility of the pharmacy. The prescriber is responsible for the evaluation, monitoring, and prescribing of these medications.   Prescribers will NOT: Re-issue prescriptions that have been partially filled. Re-issue prescriptions already sent to a pharmacy.  Re-send prescriptions to a different pharmacy because yours did not have your medication. Ask pharmacist to order more medicine or transfer the prescription to another pharmacy. (Read below.)  New 2023 regulation: "August 18, 2022 Revised Regulation Allows DEA-Registered Pharmacies to Transfer Electronic Prescriptions at a Patient's Request [place] Patients now have the ability to request their electronic prescription be transferred to another pharmacy without having to go back to their practitioner to initiate the request. This revised regulation went into effect on Monday, August 14, 2022.     At a patient's request, a DEA-registered retail pharmacy can now transfer an electronic prescription for a controlled substance (schedules  II-V) to another DEA-registered retail pharmacy. Prior to this change, patients would have to go through their practitioner to cancel their prescription and have it re-issued to a different pharmacy. The process was taxing and time consuming for both patients and practitioners.    The Drug Enforcement Administration [facility]) published its intent to revise the process for transferring electronic prescriptions on November 05, 2020.  The final rule was published in the federal register on July 13, 2022  and went into effect 30 days later.  Under the final rule, a prescription can only be transferred once between pharmacies, and only if allowed under existing state or other applicable law. The prescription must remain in its electronic form; may not be altered in any way; and the transfer must be communicated directly between two licensed pharmacists. It's important to note, any authorized refills transfer with the original prescription, which means the entire prescription will be filled at the same pharmacy".  Reference: [employer] [facility] website announcement)  [email or web address].pdf ([place])   [employer] / Vol. 88, No. 143 / Thursday, July 13, 2022 / Rules and Regulations DEPARTMENT OF JUSTICE  Drug Enforcement Administration  21 CFR Part 1306  [Docket No. [id number]]  RIN [id number] Transfer of Electronic Prescriptions for Schedules II-V Controlled Substances Between Pharmacies for Initial Filling  ____________________________________________________________________________________________     _______________________________________________________________________  Medication Rules  Purpose: To inform patients, and their family members, of our medication rules and regulations.  Applies to: All patients receiving prescriptions from our practice (written or electronic).  Pharmacy of record: This is the pharmacy where your electronic prescriptions will be sent. Make sure we have the correct one.  Electronic prescriptions: In compliance with the [place] (STOP) Act of 2017 (Session Lanny Cramp [phone]), effective December 18, 2018, all controlled substances must be electronically prescribed. Written prescriptions, faxing, or calling prescriptions to a pharmacy will no  longer be done.  Prescription refills: These will be provided only during in-person appointments. No medications will be renewed without a "face-to-face" evaluation with your provider. Applies to all prescriptions.  NOTE: The following applies primarily to controlled substances (Opioid* Pain Medications).   Type of encounter (visit): For patients receiving controlled substances, face-to-face visits are required. (Not an option and not up to the patient.)  Patient's responsibilities: Pain Pills: Bring all pain pills to every appointment (except for procedure appointments). Pill Bottles: Bring pills in original pharmacy bottle. Bring bottle, even if empty. Always bring the bottle of the most recent fill.  Medication refills: You are responsible for knowing and keeping track of what medications you are taking and when is it that you will need a refill. The day before your appointment: write a list of all prescriptions that need to be refilled. The day of the appointment: give the list to the admitting nurse. Prescriptions will be written only during appointments. No prescriptions will be written on procedure days. If you forget a medication: it will not be "Called in", "Faxed", or "electronically sent". You will need to get another appointment to get these prescribed. No early refills. Do not call asking to have your prescription filled early. Partial  or short prescriptions: Occasionally your pharmacy may not have enough pills to fill your prescription.  NEVER ACCEPT a partial fill or a prescription that is short of the total amount of pills that you were prescribed.  With controlled substances the law allows 72 hours for the pharmacy to complete the prescription.  If  the prescription is not completed within 72 hours, the pharmacist will require a new prescription to be written. This means that you will be short on your medicine and we WILL NOT send another prescription to complete your original  prescription.  Instead, request the pharmacy to send a carrier to a nearby branch to get enough medication to provide you with your full prescription. Prescription Accuracy: You are responsible for carefully inspecting your prescriptions before leaving our office. Have the discharge nurse carefully go over each prescription with you, before taking them home. Make sure that your name is accurately spelled, that your address is correct. Check the name and dose of your medication to make sure it is accurate. Check the number of pills, and the written instructions to make sure they are clear and accurate. Make sure that you are given enough medication to last until your next medication refill appointment. Taking Medication: Take medication as prescribed. When it comes to controlled substances, taking less pills or less frequently than prescribed is permitted and encouraged. Never take more pills than instructed. Never take the medication more frequently than prescribed.  Inform other Doctors: Always inform, all of your healthcare providers, of all the medications you take. Pain Medication from other Providers: You are not allowed to accept any additional pain medication from any other Doctor or Healthcare provider. There are two exceptions to this rule. (see below) In the event that you require additional pain medication, you are responsible for notifying us, as stated below. Cough Medicine: Often these contain an opioid, such as codeine or hydrocodone. Never accept or take cough medicine containing these opioids if you are already taking an opioid* medication. The combination may cause respiratory failure and death. Medication Agreement: You are responsible for carefully reading and following our Medication Agreement. This must be signed before receiving any prescriptions from our practice. Safely store a copy of your signed Agreement. Violations to the Agreement will result in no further prescriptions.  (Additional copies of our Medication Agreement are available upon request.) Laws, Rules, & Regulations: All patients are expected to follow all Federal and [employer], [employer], Rules, [employer]. Ignorance of the Laws does not constitute a valid excuse.  Illegal drugs and Controlled Substances: The use of illegal substances (including, but not limited to marijuana and its derivatives) and/or the illegal use of any controlled substances is strictly prohibited. Violation of this rule may result in the immediate and permanent discontinuation of any and all prescriptions being written by our practice. The use of any illegal substances is prohibited. Adopted CDC guidelines & recommendations: Target dosing levels will be at or below 60 MME/day. Use of benzodiazepines** is not recommended.  Exceptions: There are only two exceptions to the rule of not receiving pain medications from other Healthcare Providers. Exception #1 (Emergencies): In the event of an emergency (i.e.: accident requiring emergency care), you are allowed to receive additional pain medication. However, you are responsible for: As soon as you are able, call our office [phone], at any time of the day or night, and leave a message stating your name, the date and nature of the emergency, and the name and dose of the medication prescribed. In the event that your call is answered by a member of our staff, make sure to document and save the date, time, and the name of the person that took your information.  Exception #2 (Planned Surgery): In the event that you are scheduled by another doctor or dentist to have any  type of surgery or procedure, you are allowed (for a period no longer than 30 days), to receive additional pain medication, for the acute post-op pain. However, in this case, you are responsible for picking up a copy of our "Post-op Pain Management for Surgeons" handout, and giving it to your surgeon or dentist. This document is  available at our office, and does not require an appointment to obtain it. Simply go to our office during business hours (Monday-Thursday from 8:00 AM to 4:00 PM) (Friday 8:00 AM to 12:00 Noon) or if you have a scheduled appointment with [place], prior to your surgery, and ask for it by name. In addition, you are responsible for: calling our office [phone], at any time of the day or night, and leaving a message stating your name, name of your surgeon, type of surgery, and date of procedure or surgery. Failure to comply with your responsibilities may result in termination of therapy involving the controlled substances. Medication Agreement Violation. Following the above rules, including your responsibilities will help you in avoiding a Medication Agreement Violation ("Breaking your Pain Medication Contract").  Consequences:  Not following the above rules may result in permanent discontinuation of medication prescription therapy.  *Opioid medications include: morphine, codeine, oxycodone, oxymorphone, hydrocodone, hydromorphone, meperidine, tramadol, tapentadol, buprenorphine, fentanyl, methadone. **Benzodiazepine medications include: diazepam (Valium), alprazolam (Xanax), clonazepam (Klonopine), lorazepam (Ativan), clorazepate (Tranxene), chlordiazepoxide (Librium), estazolam (Prosom), oxazepam (Serax), temazepam (Restoril), triazolam (Halcion) (Last updated: 10/10/2022) ______________________________________________________________________    ______________________________________________________________________  Medication Recommendations and Reminders  Applies to: All patients receiving prescriptions (written and/or electronic).  Medication Rules & Regulations: You are responsible for reading, knowing, and following our "Medication Rules" document. These exist for your safety and that of others. They are not flexible and neither are we. Dismissing or ignoring them is an act of "non-compliance"  that may result in complete and irreversible termination of such medication therapy. For safety reasons, "non-compliance" will not be tolerated. As with the [place] fundamental legal principle of "ignorance of the law is no defense", we will accept no excuses for not having read and knowing the content of documents provided to you by our practice.  Pharmacy of record:  Definition: This is the pharmacy where your electronic prescriptions will be sent.  We do not endorse any particular pharmacy. It is up to you and your insurance to decide what pharmacy to use.  We do not restrict you in your choice of pharmacy. However, once we write for your prescriptions, we will NOT be re-sending more prescriptions to fix restricted supply problems created by your pharmacy, or your insurance.  The pharmacy listed in the electronic medical record should be the one where you want electronic prescriptions to be sent. If you choose to change pharmacy, simply notify our nursing staff. Changes will be made only during your regular appointments and not over the phone.  Recommendations: Keep all of your pain medications in a safe place, under lock and key, even if you live alone. We will NOT replace lost, stolen, or damaged medication. We do not accept "Police Reports" as proof of medications having been stolen. After you fill your prescription, take 1 week's worth of pills and put them away in a safe place. You should keep a separate, properly labeled bottle for this purpose. The remainder should be kept in the original bottle. Use this as your primary supply, until it runs out. Once it's gone, then you know that you have 1 week's worth of medicine, and it is  time to come in for a prescription refill. If you do this correctly, it is unlikely that you will ever run out of medicine. To make sure that the above recommendation works, it is very important that you make sure your medication refill appointments are scheduled at least 1  week before you run out of medicine. To do this in an effective manner, make sure that you do not leave the office without scheduling your next medication management appointment. Always ask the nursing staff to show you in your prescription , when your medication will be running out. Then arrange for the receptionist to get you a return appointment, at least 7 days before you run out of medicine. Do not wait until you have 1 or 2 pills left, to come in. This is very poor planning and does not take into consideration that we may need to cancel appointments due to bad weather, sickness, or emergencies affecting our staff. DO NOT ACCEPT A "Partial Fill": If for any reason your pharmacy does not have enough pills/tablets to completely fill or refill your prescription, do not allow for a "partial fill". The law allows the pharmacy to complete that prescription within 72 hours, without requiring a new prescription. If they do not fill the rest of your prescription within those 72 hours, you will need a separate prescription to fill the remaining amount, which we will NOT provide. If the reason for the partial fill is your insurance, you will need to talk to the pharmacist about payment alternatives for the remaining tablets, but again, DO NOT ACCEPT A PARTIAL FILL, unless you can trust your pharmacist to obtain the remainder of the pills within 72 hours.  Prescription refills and/or changes in medication(s):  Prescription refills, and/or changes in dose or medication, will be conducted only during scheduled medication management appointments. (Applies to both, written and electronic prescriptions.) No refills on procedure days. No medication will be changed or started on procedure days. No changes, adjustments, and/or refills will be conducted on a procedure day. Doing so will interfere with the diagnostic portion of the procedure. No phone refills. No medications will be "called into the pharmacy". No Fax  refills. No weekend refills. No Holliday refills. No after hours refills.  Remember:  Business hours are:  Monday to Thursday 8:00 AM to 4:00 PM Provider's Schedule: Milinda Pointer, MD - Appointments are:  Medication management: Monday and Wednesday 8:00 AM to 4:00 PM Procedure day: Tuesday and Thursday 7:30 AM to 4:00 PM Gillis Santa, MD - Appointments are:  Medication management: Tuesday and Thursday 8:00 AM to 4:00 PM Procedure day: Monday and Wednesday 7:30 AM to 4:00 PM (Last update: 10/10/2022) ______________________________________________________________________    ____________________________________________________________________________________________  Drug Holidays  What is a "Drug Holiday"? Drug Holiday: is the name given to the process of slowly tapering down and temporarily stopping the pain medication for the purpose of decreasing or eliminating tolerance to the drug.  Benefits Improved effectiveness Decreased required effective dose Improved pain control End dependence on high dose therapy Decrease cost of therapy Uncovering "opioid-induced hyperalgesia". (OIH)  What is "opioid hyperalgesia"? It is a paradoxical increase in pain caused by exposure to opioids. Stopping the opioid pain medication, contrary to the expected, it actually decreases or completely eliminates the pain. Ref.: "A comprehensive review of opioid-induced hyperalgesia". Brion Aliment, et.al. Pain Physician. 2011 Mar-Apr;14(2):145-61.  What is tolerance? Tolerance: the progressive loss of effectiveness of a pain medicine due to repetitive use. A common problem of opioid pain medications.  How long should a "Drug Holiday" last? Effectiveness depends on the patient staying off all opioid pain medicines for a minimum of 14 consecutive days. (2 weeks)  How about just taking less of the medicine? Does not work. Will not accomplish goal of eliminating the excess receptors.  How about  switching to a different pain medicine? (AKA. "Opioid rotation") Does not work. Creates the illusion of effectiveness by taking advantage of inaccurate equivalent dose calculations between different opioids. -This "technique" was promoted by studies funded by [employer], such as [employer], creators of "OxyContin".  Can I stop the medicine "cold [place]"? Depends. You should always coordinate with your Pain Specialist to make the transition as smoothly as possible. Avoid stopping the medicine abruptly without consulting. We recommend a "slow taper".  What is a slow taper? Taper: refers to the gradual decrease in dose.   How do I stop/taper the dose? Slowly. Decrease the daily amount of pills that you take by one (1) pill every seven (7) days. This is called a "slow downward taper". Example: if you normally take four (4) pills per day, drop it to three (3) pills per day for seven (7) days, then to two (2) pills per day for seven (7) days, then to one (1) per day for seven (7) days, and then stop the medicine. The 14 day "Drug Holiday" starts on the first day without medicine.   Will I experience withdrawals? Unlikely with a slow taper.  What triggers withdrawals? Withdrawals are triggered by the sudden/abrupt stop of high dose opioids. Withdrawals can be avoided by slowly decreasing the dose over a prolonged period of time.  What are withdrawals? Symptoms associated with sudden/abrupt reduction/stopping of high-dose, long-term use of pain medication. Withdrawal are seldom seen on low dose therapy, or patients rarely taking opioid medication.  Early Withdrawal Symptoms may include: Agitation Anxiety Muscle aches Increased tearing Insomnia Runny nose Sweating Yawning  Late symptoms may include: Abdominal cramping Diarrhea Dilated pupils Goose bumps Nausea Vomiting  (Last update:  11/26/2022) ____________________________________________________________________________________________    ____________________________________________________________________________________________  WARNING: CBD (cannabidiol) & Delta (Delta-8 tetrahydrocannabinol) products.   Applicable to:  All individuals currently taking or considering taking CBD (cannabidiol) and, more important, all patients taking opioid analgesic controlled substances (pain medication). (Example: oxycodone; oxymorphone; hydrocodone; hydromorphone; morphine; methadone; tramadol; tapentadol; fentanyl; buprenorphine; butorphanol; dextromethorphan; meperidine; codeine; etc.)  Introduction:  Recently there has been a drive towards the use of "natural" products for the treatment of different conditions, including pain anxiety and sleep disorders. Marijuana and hemp are two varieties of the cannabis genus plants. Marijuana and its derivatives are illegal, while hemp and its derivatives are not. Cannabidiol (CBD) and tetrahydrocannabinol (THC), are two natural compounds found in plants of the Cannabis genus. They can both be extracted from hemp or marijuana. Both compounds interact with your body's endocannabinoid system in very different ways. CBD is associated with pain relief (analgesia) while THC is associated with the psychoactive effects ("the high") obtained from the use of marijuana products. There are two main types of THC: Delta-9, which comes from the marijuana plant and it is illegal, and Delta-8, which comes from the hemp plant, and it is legal. (Both, Delta-9-THC and Delta-8-THC are psychoactive and give you "the high".)   Legality:  Marijuana and its derivatives: illegal Hemp and its derivatives: Legal (State dependent) UPDATE: (02/03/2022) The Drug Enforcement Agency ([place]) issued a letter stating that "delta" cannabinoids, including Delta-8-THCO and Delta-9-THCO, synthetically derived from hemp do not qualify as  hemp and will be viewed as Schedule I drugs. (Schedule I drugs, substances, or chemicals are defined as drugs with no currently accepted medical use and a high potential for abuse. Some examples of Schedule I drugs are: heroin, lysergic acid diethylamide (LSD), marijuana (cannabis), 3,4-methylenedioxymethamphetamine (ecstasy), methaqualone, and peyote.) ([email or web address]://jennings.com/)  Legal status of CBD in [place]:  "Conditionally Legal"  Reference: "FDA Regulation of Cannabis and Cannabis-Derived Products, Including Cannabidiol (CBD)" - [employer].pt  Warning:  CBD is not FDA approved and has not undergo the same manufacturing controls as prescription drugs.  This means that the purity and safety of available CBD may be questionable. Most of the time, despite manufacturer's claims, it is contaminated with THC (delta-9-tetrahydrocannabinol - the chemical in marijuana responsible for the "HIGH").  When this is the case, the [facility] contaminant will trigger a positive urine drug screen (UDS) test for Marijuana (carboxy-THC).   The FDA recently put out a warning about 5 things that everyone should be aware of regarding Delta-8 THC: Delta-8 THC products have not been evaluated or approved by the FDA for safe use and may be marketed in ways that put the public health at risk. The FDA has received adverse event reports involving delta-8 THC-containing products. Delta-8 THC has psychoactive and intoxicating effects. Delta-8 THC manufacturing often involve use of potentially harmful chemicals to create the concentrations of delta-8 THC claimed in the marketplace. The final delta-8 THC product may have potentially harmful by-products (contaminants) due to the chemicals used in the process. Manufacturing of delta-8 THC products may occur in uncontrolled or unsanitary settings, which may lead to the presence of  unsafe contaminants or other potentially harmful substances. Delta-8 THC products should be kept out of the reach of children and pets.  NOTE: Because a positive UDS for any illicit substance is a violation of our medication agreement, your opioid analgesics (pain medicine) may be permanently discontinued.  MORE ABOUT CBD  General Information: CBD was discovered in [birth year] and it is a derivative of the cannabis sativa genus plants (Marijuana and Hemp). It is one of the 113 identified substances found in Marijuana. It accounts for up to 40% of the plant's extract. As of 2018, preliminary clinical studies on CBD included research for the treatment of anxiety, movement disorders, and pain. CBD is available and consumed in multiple forms, including inhalation of smoke or vapor, as an aerosol spray, and by mouth. It may be supplied as an oil containing CBD, capsules, dried cannabis, or as a liquid solution. CBD is thought not to be as psychoactive as THC (delta-9-tetrahydrocannabinol - the chemical in marijuana responsible for the "HIGH"). Studies suggest that CBD may interact with different biological target receptors in the body, including cannabinoid and other neurotransmitter receptors. As of 2018 the mechanism of action for its biological effects has not been determined.  Side-effects  Adverse reactions: Dry mouth, diarrhea, decreased appetite, fatigue, drowsiness, malaise, weakness, sleep disturbances, and others.  Drug interactions:  CBD may interact with medications such as blood-thinners. CBD causes drowsiness on its own and it will increase drowsiness caused by other medications, including antihistamines (such as Benadryl), benzodiazepines (Xanax, Ativan, Valium), antipsychotics, antidepressants, opioids, alcohol and supplements such as kava, melatonin and St. John's Wort.  Other drug interactions: Brivaracetam (Briviact); Caffeine; Carbamazepine (Tegretol); Citalopram (Celexa); Clobazam (Onfi);  Eslicarbazepine (Aptiom); Everolimus (Zostress); Lithium; Methadone (Dolophine); Rufinamide (Banzel); Sedative medications (CNS depressants); Sirolimus (Rapamune); Stiripentol (Diacomit); Tacrolimus (Prograf); Tamoxifen ; Soltamox); Topiramate (Topamax); Valproate; Warfarin (Coumadin); Zonisamide. (Last update: 11/27/2022) ____________________________________________________________________________________________  ____________________________________________________________________________________________  Naloxone Nasal Spray  Why am I receiving this medication? [place] STOP ACT requires that all patients taking high dose opioids or at risk of opioids respiratory depression, be prescribed an opioid reversal agent, such as Naloxone (AKA: Narcan).  What is this medication? NALOXONE (nal OX one) treats opioid overdose, which causes slow or shallow breathing, severe drowsiness, or trouble staying awake. Call emergency services after using this medication. You may need additional treatment. Naloxone works by reversing the effects of opioids. It belongs to a group of medications called opioid blockers.  COMMON BRAND NAME(S): Kloxxado, Narcan  What should I tell my care team before I take this medication? They need to know if you have any of these conditions: Heart disease Substance use disorder An unusual or allergic reaction to naloxone, other medications, foods, dyes, or preservatives Pregnant or trying to get pregnant Breast-feeding  When to use this medication? This medication is to be used for the treatment of respiratory depression (less than 8 breaths per minute) secondary to opioid overdose.   How to use this medication? This medication is for use in the nose. Lay the person on their back. Support their neck with your hand and allow the head to tilt back before giving the medication. The nasal spray should be given into 1 nostril. After giving the medication, move the person  onto their side. Do not remove or test the nasal spray until ready to use. Get emergency medical help right away after giving the first dose of this medication, even if the person wakes up. You should be familiar with how to recognize the signs and symptoms of a narcotic overdose. If more doses are needed, give the additional dose in the other nostril. Talk to your care team about the use of this medication in children. While this medication may be prescribed for children as young as newborns for selected conditions, precautions do apply.  Naloxone Overdosage: If you think you have taken too much of this medicine contact a poison control center or emergency room at once.  NOTE: This medicine is only for you. Do not share this medicine with others.  What if I miss a dose? This does not apply.  What may interact with this medication? This is only used during an emergency. No interactions are expected during emergency use. This list may not describe all possible interactions. Give your health care provider a list of all the medicines, herbs, non-prescription drugs, or dietary supplements you use. Also tell them if you smoke, drink alcohol, or use illegal drugs. Some items may interact with your medicine.  What should I watch for while using this medication? Keep this medication ready for use in the case of an opioid overdose. Make sure that you have the phone number of your care team and local hospital ready. You may need to have additional doses of this medication. Each nasal spray contains a single dose. Some emergencies may require additional doses. After use, bring the treated person to the nearest hospital or call 911. Make sure the treating care team knows that the person has received a dose of this medication. You will receive additional instructions on what to do during and after use of this medication before an emergency occurs.  What side effects may I notice from receiving this  medication? Side effects that you should report to your care team as soon as possible: Allergic reactions--skin rash, itching, hives, swelling of the face, lips, tongue, or throat Side effects that  usually do not require medical attention (report these to your care team if they continue or are bothersome): Constipation Dryness or irritation inside the nose Headache Increase in blood pressure Muscle spasms Stuffy nose Toothache This list may not describe all possible side effects. Call your doctor for medical advice about side effects. You may report side effects to FDA at 1-800-FDA-1088.  Where should I keep my medication? Because this is an emergency medication, you should keep it with you at all times.  Keep out of the reach of children and pets. Store between 20 and 25 degrees C (68 and 77 degrees F). Do not freeze. Throw away any unused medication after the expiration date. Keep in original box until ready to use.  NOTE: This sheet is a summary. It may not cover all possible information. If you have questions about this medicine, talk to your doctor, pharmacist, or health care provider.   2023 Elsevier/Gold Standard (2021-08-12 00:00:00)  ____________________________________________________________________________________________

## 2023-02-26 DIAGNOSIS — R5383 Other fatigue: Secondary | ICD-10-CM | POA: Diagnosis not present

## 2023-02-26 DIAGNOSIS — R7989 Other specified abnormal findings of blood chemistry: Secondary | ICD-10-CM | POA: Diagnosis not present

## 2023-02-26 DIAGNOSIS — R112 Nausea with vomiting, unspecified: Secondary | ICD-10-CM | POA: Diagnosis not present

## 2023-02-26 DIAGNOSIS — N179 Acute kidney failure, unspecified: Secondary | ICD-10-CM | POA: Diagnosis not present

## 2023-02-26 DIAGNOSIS — D509 Iron deficiency anemia, unspecified: Secondary | ICD-10-CM | POA: Diagnosis not present

## 2023-02-26 DIAGNOSIS — R296 Repeated falls: Secondary | ICD-10-CM | POA: Diagnosis not present

## 2023-02-26 DIAGNOSIS — S0990XA Unspecified injury of head, initial encounter: Secondary | ICD-10-CM | POA: Diagnosis not present

## 2023-02-26 DIAGNOSIS — G894 Chronic pain syndrome: Secondary | ICD-10-CM | POA: Diagnosis not present

## 2023-02-26 DIAGNOSIS — N189 Chronic kidney disease, unspecified: Secondary | ICD-10-CM | POA: Diagnosis not present

## 2023-02-26 DIAGNOSIS — E785 Hyperlipidemia, unspecified: Secondary | ICD-10-CM | POA: Diagnosis not present

## 2023-02-26 DIAGNOSIS — I129 Hypertensive chronic kidney disease with stage 1 through stage 4 chronic kidney disease, or unspecified chronic kidney disease: Secondary | ICD-10-CM | POA: Diagnosis not present

## 2023-02-26 DIAGNOSIS — K219 Gastro-esophageal reflux disease without esophagitis: Secondary | ICD-10-CM | POA: Diagnosis not present

## 2023-02-26 DIAGNOSIS — D649 Anemia, unspecified: Secondary | ICD-10-CM | POA: Insufficient documentation

## 2023-02-26 DIAGNOSIS — K5903 Drug induced constipation: Secondary | ICD-10-CM | POA: Diagnosis not present

## 2023-02-26 DIAGNOSIS — R531 Weakness: Secondary | ICD-10-CM | POA: Diagnosis not present

## 2023-02-26 DIAGNOSIS — T402X5A Adverse effect of other opioids, initial encounter: Secondary | ICD-10-CM | POA: Diagnosis not present

## 2023-02-26 DIAGNOSIS — C50911 Malignant neoplasm of unspecified site of right female breast: Secondary | ICD-10-CM | POA: Diagnosis not present

## 2023-02-26 DIAGNOSIS — N182 Chronic kidney disease, stage 2 (mild): Secondary | ICD-10-CM | POA: Diagnosis not present

## 2023-02-26 DIAGNOSIS — N3020 Other chronic cystitis without hematuria: Secondary | ICD-10-CM | POA: Insufficient documentation

## 2023-02-26 DIAGNOSIS — R1030 Lower abdominal pain, unspecified: Secondary | ICD-10-CM | POA: Diagnosis not present

## 2023-02-27 ENCOUNTER — Telehealth: Payer: Self-pay | Admitting: Internal Medicine

## 2023-02-27 DIAGNOSIS — N189 Chronic kidney disease, unspecified: Secondary | ICD-10-CM | POA: Diagnosis not present

## 2023-02-27 DIAGNOSIS — D509 Iron deficiency anemia, unspecified: Secondary | ICD-10-CM | POA: Diagnosis not present

## 2023-02-27 DIAGNOSIS — R531 Weakness: Secondary | ICD-10-CM | POA: Diagnosis not present

## 2023-02-27 DIAGNOSIS — N179 Acute kidney failure, unspecified: Secondary | ICD-10-CM | POA: Diagnosis not present

## 2023-02-27 DIAGNOSIS — R296 Repeated falls: Secondary | ICD-10-CM | POA: Diagnosis not present

## 2023-02-27 DIAGNOSIS — R7989 Other specified abnormal findings of blood chemistry: Secondary | ICD-10-CM | POA: Diagnosis not present

## 2023-02-27 NOTE — Telephone Encounter (Signed)
Monique a Marketing executive from [facility], called in staying that pt was admitted and need to F/U with provider. I booked her with Dr. Nicki Reaper on 3/20 '@11am'$ .

## 2023-03-01 ENCOUNTER — Encounter: Payer: Self-pay | Admitting: Pain Medicine

## 2023-03-01 ENCOUNTER — Ambulatory Visit: Payer: Medicare HMO | Attending: Pain Medicine | Admitting: Pain Medicine

## 2023-03-01 ENCOUNTER — Ambulatory Visit
Admission: RE | Admit: 2023-03-01 | Discharge: 2023-03-01 | Disposition: A | Discharge status: Home / Self Care | Payer: Medicare HMO | Source: Ambulatory Visit | Attending: Pain Medicine | Admitting: Pain Medicine

## 2023-03-01 VITALS — BP 153/86 | HR 82 | Temp 97.1°F | Resp 16 | Ht 64.0 in | Wt 185.0 lb

## 2023-03-01 DIAGNOSIS — M51369 Other intervertebral disc degeneration, lumbar region without mention of lumbar back pain or lower extremity pain: Secondary | ICD-10-CM

## 2023-03-01 DIAGNOSIS — M47816 Spondylosis without myelopathy or radiculopathy, lumbar region: Secondary | ICD-10-CM | POA: Diagnosis not present

## 2023-03-01 DIAGNOSIS — M545 Low back pain, unspecified: Secondary | ICD-10-CM | POA: Insufficient documentation

## 2023-03-01 DIAGNOSIS — M533 Sacrococcygeal disorders, not elsewhere classified: Secondary | ICD-10-CM

## 2023-03-01 DIAGNOSIS — Z5189 Encounter for other specified aftercare: Secondary | ICD-10-CM

## 2023-03-01 DIAGNOSIS — M47898 Other spondylosis, sacral and sacrococcygeal region: Secondary | ICD-10-CM

## 2023-03-01 DIAGNOSIS — M9904 Segmental and somatic dysfunction of sacral region: Secondary | ICD-10-CM

## 2023-03-01 DIAGNOSIS — M47817 Spondylosis without myelopathy or radiculopathy, lumbosacral region: Secondary | ICD-10-CM

## 2023-03-01 DIAGNOSIS — G8929 Other chronic pain: Secondary | ICD-10-CM

## 2023-03-01 DIAGNOSIS — M51379 Other intervertebral disc degeneration, lumbosacral region without mention of lumbar back pain or lower extremity pain: Secondary | ICD-10-CM

## 2023-03-01 DIAGNOSIS — R937 Abnormal findings on diagnostic imaging of other parts of musculoskeletal system: Secondary | ICD-10-CM

## 2023-03-01 DIAGNOSIS — M461 Sacroiliitis, not elsewhere classified: Secondary | ICD-10-CM | POA: Diagnosis not present

## 2023-03-01 DIAGNOSIS — M5137 Other intervertebral disc degeneration, lumbosacral region: Secondary | ICD-10-CM | POA: Insufficient documentation

## 2023-03-01 DIAGNOSIS — M5136 Other intervertebral disc degeneration, lumbar region: Secondary | ICD-10-CM

## 2023-03-01 MED ORDER — METHYLPREDNISOLONE ACETATE 80 MG/ML IJ SUSP
80.0000 mg | Freq: Once | INTRAMUSCULAR | Status: AC
  Administered 2023-03-01: 80 mg via INTRA_ARTICULAR
  Filled 2023-03-01: qty 1

## 2023-03-01 MED ORDER — PENTAFLUOROPROP-TETRAFLUOROETH EX AERO
INHALATION_SPRAY | Freq: Once | CUTANEOUS | Status: AC
  Administered 2023-03-01: 30 via TOPICAL

## 2023-03-01 MED ORDER — TRIAMCINOLONE ACETONIDE 40 MG/ML IJ SUSP
40.0000 mg | Freq: Once | INTRAMUSCULAR | Status: AC
  Administered 2023-03-01: 40 mg
  Filled 2023-03-01: qty 1

## 2023-03-01 MED ORDER — LIDOCAINE HCL 2 % IJ SOLN
20.0000 mL | Freq: Once | INTRAMUSCULAR | Status: AC
  Administered 2023-03-01: 400 mg
  Filled 2023-03-01: qty 20

## 2023-03-01 MED ORDER — ROPIVACAINE HCL 2 MG/ML IJ SOLN
9.0000 mL | Freq: Once | INTRAMUSCULAR | Status: AC
  Administered 2023-03-01: 9 mL via PERINEURAL
  Filled 2023-03-01: qty 20

## 2023-03-01 MED ORDER — ROPIVACAINE HCL 2 MG/ML IJ SOLN
4.0000 mL | Freq: Once | INTRAMUSCULAR | Status: AC
  Administered 2023-03-01: 4 mL via INTRA_ARTICULAR
  Filled 2023-03-01: qty 20

## 2023-03-01 NOTE — Patient Instructions (Signed)

## 2023-03-01 NOTE — Progress Notes (Signed)
PROVIDER NOTE: Interpretation of information contained herein should be left to medically-trained personnel. Specific patient instructions are provided elsewhere under "Patient Instructions" section of medical record. This document was created in part using STT-dictation technology, any transcriptional errors that may result from this process are unintentional.  Patient: Wanda Martinez Type: Established DOB: 21-May-1935 MRN: [id number] PCP: Wanda Pheasant, MD  Service: Procedure DOS: 03/01/2023 Setting: Ambulatory Location: Ambulatory outpatient facility Delivery: Face-to-face Provider: Gaspar Cola, MD Specialty: Interventional Pain Management Specialty designation: 09 Location: Outpatient facility Ref. Prov.: Wanda Pointer, MD       Interventional Therapy   Procedure: Lumbar Facet, Medial Branch Block(s) #7  (last diagnostic done on 08/29/2022) (last RFA done 09/28/2022) Laterality: Right  Level(s): L3, L4, L5, and S1 Medial Branch Level(s).  Rationale: Injecting these levels should provide temporary sensory nerve conduction blockade of the L4-5 and L5-S1 lumbar facet (zygapophyseal) joints. Target: Terminal medial branch nerve division of lumbosacral nerve root dorsal rami.  Procedure No.2: Sacroiliac joint injection #4 (last diagnostic done 12/21/2020) (last RFA done 06/05/2019) Laterality: Right     Approach: Inferior postero-medial percutaneous approach. Level: PIIS (Posterior Inferior Iliac Spine) Sacroiliac Joint Target: For lower sacroiliac joint block(s), the target is the inferior and posterior margin of the sacroiliac joint.  Imaging: Fluoroscopic guidance         Anesthesia: Local anesthesia (1-2% Lidocaine) Anxiolysis: None                 Sedation: No Sedation                       DOS: 03/01/2023 Performed by: Wanda Cola, MD  Purpose: Diagnostic/Therapeutic Indications: Low back pain severe enough to impact quality of life or  function. Medical necessity rationale: procedure needed and proper for the diagnosis and/or treatment of the patient's medical symptoms and needs. 1. Chronic low back pain (Right) w/o sciatica   2. Lumbar facet syndrome (Bilateral) (R>L)   3. Lumbar facet hypertrophy (Multilevel) (Bilateral)   4. Spondylosis without myelopathy or radiculopathy, lumbosacral region   5. Chronic sacroiliac joint pain (Right)   6. Osteoarthritis of sacroiliac joint (Right)   7. Other spondylosis, sacral and sacrococcygeal region   8. Somatic dysfunction of sacroiliac joint (Right)   9. Sacroiliac joint dysfunction (Right)   10. DDD (degenerative disc disease), lumbosacral    NAS-11 Pain score:   Pre-procedure: 5 /10   Post-procedure: 0-No pain/10     Position / Prep / Materials:  Position: Prone  Prep solution: DuraPrep (Iodine Povacrylex [0.7% available iodine] and Isopropyl Alcohol, 74% w/w) Area Prepped: Posterolateral Lumbosacral Spine (Wide prep: From the lower border of the scapula down to the end of the tailbone and from flank to flank.)  Materials for procedure No.1:  Tray: Block Needle(s):  Type: Spinal  Gauge (G): 22  Length: 5-in Qty: 4     Materials for procedure No.2:  Tray: Block Needle(s):  Type: Spinal  Gauge (G): 22  Length: 5-in Qty: 1      Pre-op H&P Assessment:  Wanda Martinez is a 88 y.o. (year old), female patient, seen today for interventional treatment. She  has a past surgical history that includes Tonsillectomy and adenoidectomy (1979); Vesicovaginal fistula closure w/ TAH (1983); Breast surgery (1986); Breast enhancement surgery (1987); Breast implant removal; Breast implant removal (Right, 08/29/2012); Mastectomy (08/2012); Abdominal hysterectomy; Colonoscopy with propofol (N/A, 09/13/2016); Breast biopsy (2013); and Blepharoplasty. Wanda Martinez has a current medication list which includes the  following prescription(s): amlodipine, aspirin ec, calcium carbonate, citalopram,  cyclobenzaprine, donepezil, erythromycin, gabapentin, glucosamine sulfate, glucosamine-chondroitin, hydrocodone-acetaminophen, [START ON 03/30/2023] hydrocodone-acetaminophen, [START ON 04/29/2023] hydrocodone-acetaminophen, letrozole, losartan, lubiprostone, magnesium oxide, melatonin, mirabegron er, multivitamin, omega-3, pantoprazole, rosuvastatin, and naloxone. Her primarily concern today is the Back Pain  Initial Vital Signs:  Pulse/HCG Rate: 99  Temp: (!) 97.1 F (36.2 C) Resp: 16 BP: 139/81 SpO2: 97 %  BMI: Estimated body mass index is 31.76 kg/m as calculated from the following:   Height as of this encounter: '5\' 4"'$  (1.626 m).   Weight as of this encounter: 185 lb (83.9 kg).  Risk Assessment: Allergies: Reviewed. She is allergic to sulfa antibiotics and vesicare [solifenacin].  Allergy Precautions: None required Coagulopathies: Reviewed. None identified.  Blood-thinner therapy: None at this time Active Infection(s): Reviewed. None identified. Wanda Martinez is afebrile  Site Confirmation: Wanda Martinez was asked to confirm the procedure and laterality before marking the site Procedure checklist: Completed Consent: Before the procedure and under the influence of no sedative(s), amnesic(s), or anxiolytics, the patient was informed of the treatment options, risks and possible complications. To fulfill our ethical and legal obligations, as recommended by the American Medical Association's Code of Ethics, I have informed the patient of my clinical impression; the nature and purpose of the treatment or procedure; the risks, benefits, and possible complications of the intervention; the alternatives, including doing nothing; the risk(s) and benefit(s) of the alternative treatment(s) or procedure(s); and the risk(s) and benefit(s) of doing nothing. The patient was provided information about the general risks and possible complications associated with the procedure. These may include, but are not  limited to: failure to achieve desired goals, infection, bleeding, organ or nerve damage, allergic reactions, paralysis, and death. In addition, the patient was informed of those risks and complications associated to Spine-related procedures, such as failure to decrease pain; infection (i.e.: Meningitis, epidural or intraspinal abscess); bleeding (i.e.: epidural hematoma, subarachnoid hemorrhage, or any other type of intraspinal or peri-dural bleeding); organ or nerve damage (i.e.: Any type of peripheral nerve, nerve root, or spinal cord injury) with subsequent damage to sensory, motor, and/or autonomic systems, resulting in permanent pain, numbness, and/or weakness of one or several areas of the body; allergic reactions; (i.e.: anaphylactic reaction); and/or death. Furthermore, the patient was informed of those risks and complications associated with the medications. These include, but are not limited to: allergic reactions (i.e.: anaphylactic or anaphylactoid reaction(s)); adrenal axis suppression; blood sugar elevation that in diabetics may result in ketoacidosis or comma; water retention that in patients with history of congestive heart failure may result in shortness of breath, pulmonary edema, and decompensation with resultant heart failure; weight gain; swelling or edema; medication-induced neural toxicity; particulate matter embolism and blood vessel occlusion with resultant organ, and/or nervous system infarction; and/or aseptic necrosis of one or more joints. Finally, the patient was informed that Medicine is not an exact science; therefore, there is also the possibility of unforeseen or unpredictable risks and/or possible complications that may result in a catastrophic outcome. The patient indicated having understood very clearly. We have given the patient no guarantees and we have made no promises. Enough time was given to the patient to ask questions, all of which were answered to the patient's  satisfaction. Ms. Quinnell has indicated that she wanted to continue with the procedure. Attestation: I, the ordering provider, attest that I have discussed with the patient the benefits, risks, side-effects, alternatives, likelihood of achieving goals, and potential problems during recovery for the  procedure that I have provided informed consent. Date  Time: 03/01/2023 10:33 AM   Pre-Procedure Preparation:  Monitoring: As per clinic protocol. Respiration, ETCO2, SpO2, BP, heart rate and rhythm monitor placed and checked for adequate function Safety Precautions: Patient was assessed for positional comfort and pressure points before starting the procedure. Time-out: I initiated and conducted the "Time-out" before starting the procedure, as per protocol. The patient was asked to participate by confirming the accuracy of the "Time Out" information. Verification of the correct person, site, and procedure were performed and confirmed by me, the nursing staff, and the patient. "Time-out" conducted as per Joint Commission's Universal Protocol (UP.01.01.01). Time: 1103  Description of Procedure:          Procedure No.1: Safety Precautions: Aspiration looking for blood return was conducted prior to all injections. At no point did we inject any substances, as a needle was being advanced. Before injecting, the patient was told to immediately notify me if she was experiencing any new onset of "ringing in the ears, or metallic taste in the mouth". No attempts were made at seeking any paresthesias. Safe injection practices and needle disposal techniques used. Medications properly checked for expiration dates. SDV (single dose vial) medications used. After the completion of the procedure, all disposable equipment used was discarded in the proper designated medical waste containers. Local Anesthesia: Protocol guidelines were followed. The patient was positioned over the fluoroscopy table. The area was prepped in the  usual manner. The time-out was completed. The target area was identified using fluoroscopy. A 12-in long, straight, sterile hemostat was used with fluoroscopic guidance to locate the targets for each level blocked. Once located, the skin was marked with an approved surgical skin marker. Once all sites were marked, the skin (epidermis, dermis, and hypodermis), as well as deeper tissues (fat, connective tissue and muscle) were infiltrated with a small amount of a short-acting local anesthetic, loaded on a 10cc syringe with a 25G, 1.5-in  Needle. An appropriate amount of time was allowed for local anesthetics to take effect before proceeding to the next step. Local Anesthetic: Lidocaine 2.0% The unused portion of the local anesthetic was discarded in the proper designated containers. Technical description of process:  L2 Medial Branch Nerve Block (MBB): The target area for the L2 medial branch is at the junction of the postero-lateral aspect of the superior articular process and the superior, posterior, and medial edge of the transverse process of L3. Under fluoroscopic guidance, a Quincke needle was inserted until contact was made with os over the superior postero-lateral aspect of the pedicular shadow (target area). After negative aspiration for blood, 0.5 mL of the nerve block solution was injected without difficulty or complication. The needle was removed intact. L3 Medial Branch Nerve Block (MBB): The target area for the L3 medial branch is at the junction of the postero-lateral aspect of the superior articular process and the superior, posterior, and medial edge of the transverse process of L4. Under fluoroscopic guidance, a Quincke needle was inserted until contact was made with os over the superior postero-lateral aspect of the pedicular shadow (target area). After negative aspiration for blood, 0.5 mL of the nerve block solution was injected without difficulty or complication. The needle was removed  intact. L4 Medial Branch Nerve Block (MBB): The target area for the L4 medial branch is at the junction of the postero-lateral aspect of the superior articular process and the superior, posterior, and medial edge of the transverse process of L5. Under fluoroscopic guidance,  a Quincke needle was inserted until contact was made with os over the superior postero-lateral aspect of the pedicular shadow (target area). After negative aspiration for blood, 0.5 mL of the nerve block solution was injected without difficulty or complication. The needle was removed intact. L5 Medial Branch Nerve Block (MBB): The target area for the L5 medial branch is at the junction of the postero-lateral aspect of the superior articular process and the superior, posterior, and medial edge of the sacral ala. Under fluoroscopic guidance, a Quincke needle was inserted until contact was made with os over the superior postero-lateral aspect of the pedicular shadow (target area). After negative aspiration for blood, 0.5 mL of the nerve block solution was injected without difficulty or complication. The needle was removed intact. S1 Medial Branch Nerve Block (MBB): The target area for the S1 medial branch is at the posterior and inferior 6 o'clock position of the L5-S1 facet joint. Under fluoroscopic guidance, the Quincke needle inserted for the L5 MBB was redirected until contact was made with os over the inferior and postero aspect of the sacrum, at the 6 o' clock position under the L5-S1 facet joint (Target area). After negative aspiration for blood, 0.5 mL of the nerve block solution was injected without difficulty or complication. The needle was removed intact.        Illustration of the posterior view of the lumbar spine and the posterior neural structures. Laminae of L2 through S1 are labeled. DPRL5, dorsal primary ramus of L5; DPRS1, dorsal primary ramus of S1; DPR3, dorsal primary ramus of L3; FJ, facet (zygapophyseal) joint  L3-L4; I, inferior articular process of L4; LB1, lateral branch of dorsal primary ramus of L1; IAB, inferior articular branches from L3 medial branch (supplies L4-L5 facet joint); IBP, intermediate branch plexus; MB3, medial branch of dorsal primary ramus of L3; NR3, third lumbar nerve root; S, superior articular process of L5; SAB, superior articular branches from L4 (supplies L4-5 facet joint also); TP3, transverse process of L3.  Procedure No.2: Safety Precautions: see above. Technical description of procedure: Protocol guidelines were followed. The patient was placed in position over the fluoroscopy table. The target area was identified and the area prepped in the usual manner. Skin & deeper tissues infiltrated with local anesthetic. Appropriate amount of time allowed to pass for local anesthetics to take effect. The procedure needle was advanced under fluoroscopic guidance into the sacroiliac joint until a firm endpoint was obtained. Proper needle placement secured. Negative aspiration confirmed. Solution injected in intermittent fashion, asking for systemic symptoms every 0.5cc of injectate. The needles were then removed and the area cleansed, making sure to leave some of the prepping solution back to take advantage of its long term bactericidal properties.   -illustration of inferior approach to sacroiliac joint.  Once the entire procedure was completed, the treated area was cleaned, making sure to leave some of the prepping solution back to take advantage of its long term bactericidal properties.  Vitals:   03/01/23 1033 03/01/23 1100 03/01/23 1105 03/01/23 1110  BP: 139/81 (!) 147/80 (!) 151/84 (!) 153/86  Pulse: 99 81 88 82  Resp:  '16 16 16  '$ Temp: (!) 97.1 F (36.2 C)     SpO2: 97% 99% 98% 98%  Weight: 185 lb (83.9 kg)     Height: '5\' 4"'$  (1.626 m)        Start Time: 1103 hrs. End Time: 1108 hrs.  Imaging Guidance (Spinal):          Type  of Imaging Technique: Fluoroscopy Guidance  (Spinal) Indication(s): Assistance in needle guidance and placement for procedures requiring needle placement in or near specific anatomical locations not easily accessible without such assistance. Exposure Time: Please see nurses notes. Contrast: None used. Fluoroscopic Guidance: I was personally present during the use of fluoroscopy. "Tunnel Vision Technique" used to obtain the best possible view of the target area. Parallax error corrected before commencing the procedure. "Direction-depth-direction" technique used to introduce the needle under continuous pulsed fluoroscopy. Once target was reached, antero-posterior, oblique, and lateral fluoroscopic projection used confirm needle placement in all planes. Images permanently stored in EMR. Interpretation: No contrast injected. I personally interpreted the imaging intraoperatively. Adequate needle placement confirmed in multiple planes. Permanent images saved into the patient's record.  Antibiotic Prophylaxis:   Anti-infectives (From admission, onward)    None      Indication(s): None identified  Post-operative Assessment:  Post-procedure Vital Signs:  Pulse/HCG Rate: 82  Temp: (!) 97.1 F (36.2 C) Resp: 16 BP: (!) 153/86 SpO2: 98 %  EBL: None  Complications: No immediate post-treatment complications observed by team, or reported by patient.  Note: The patient tolerated the entire procedure well. A repeat set of vitals were taken after the procedure and the patient was kept under observation following institutional policy, for this type of procedure. Post-procedural neurological assessment was performed, showing return to baseline, prior to discharge. The patient was provided with post-procedure discharge instructions, including a section on how to identify potential problems. Should any problems arise concerning this procedure, the patient was given instructions to immediately contact us, at any time, without hesitation. In any case,  we plan to contact the patient by telephone for a follow-up status report regarding this interventional procedure.  Comments:  No additional relevant information.  Plan of Care (POC)  Orders:  Orders Placed This Encounter  Procedures   LUMBAR FACET(MEDIAL BRANCH NERVE BLOCK) MBNB    Scheduling Instructions:     Procedure: Lumbar facet block (AKA.: Lumbosacral medial branch nerve block)     Side: Right-sided     Level: L4-5 and L5-S1 Facets (L3, L4, L5, and S1 Medial Branch Nerves)     Sedation: Patient's choice.     Timeframe: Today    Order Specific Question:   Where will this procedure be performed?    Answer:   [facility]   SACROILIAC JOINT INJECTION    Scheduling Instructions:     Side: Right-sided     Sedation: Patient's choice.     Timeframe: Today    Order Specific Question:   Where will this procedure be performed?    Answer:   [facility]   DG PAIN CLINIC C-ARM 1-60 MIN NO REPORT    Intraoperative interpretation by procedural physician at [place].    Standing Status:   Standing    Number of Occurrences:   1    Order Specific Question:   Reason for exam:    Answer:   Assistance in needle guidance and placement for procedures requiring needle placement in or near specific anatomical locations not easily accessible without such assistance.   Informed Consent Details: Physician/Practitioner Attestation; Transcribe to consent form and obtain patient signature    Nursing Order: Transcribe to consent form and obtain patient signature. Note: Always confirm laterality of pain with Ms. Ermalinda Barrios, before procedure.    Order Specific Question:   Physician/Practitioner attestation of informed consent for procedure/surgical case    Answer:   I, the physician/practitioner, attest  that I have discussed with the patient the benefits, risks, side effects, alternatives, likelihood of achieving goals and potential problems during recovery for the procedure that I  have provided informed consent.    Order Specific Question:   Procedure    Answer:   Lumbar Facet Block  under fluoroscopic guidance    Order Specific Question:   Physician/Practitioner performing the procedure    Answer:   Nakira Litzau A. Dossie Arbour MD    Order Specific Question:   Indication/Reason    Answer:   Low Back Pain, with our without leg pain, due to Facet Joint Arthralgia (Joint Pain) Spondylosis (Arthritis of the Spine), without myelopathy or radiculopathy (Nerve Damage).   Informed Consent Details: Physician/Practitioner Attestation; Transcribe to consent form and obtain patient signature    Nursing Order: Transcribe to consent form and obtain patient signature. Note: Always confirm laterality of pain with Ms. Ermalinda Barrios, before procedure.    Order Specific Question:   Physician/Practitioner attestation of informed consent for procedure/surgical case    Answer:   I, the physician/practitioner, attest that I have discussed with the patient the benefits, risks, side effects, alternatives, likelihood of achieving goals and potential problems during recovery for the procedure that I have provided informed consent.    Order Specific Question:   Procedure    Answer:   Sacroiliac Joint Block    Order Specific Question:   Physician/Practitioner performing the procedure    Answer:   Lissandra Keil A. Dossie Arbour, MD    Order Specific Question:   Indication/Reason    Answer:   Chronic Low Back and Hip Pain secondary to Sacroiliac Joint Pain (Arthralgia/Arthropathy)   Provide equipment / supplies at bedside    Procedure tray: "Spinal Block Tray" (Disposable  single use)    Standing Status:   Standing    Number of Occurrences:   1    Order Specific Question:   Specify    Answer:   Spinal Tray   Follow-up    Schedule Ms. Sparby for a post-procedure follow-up evaluation encounter 2 weeks from now.    Standing Status:   Future    Standing Expiration Date:   03/15/2023    Scheduling Instructions:      Schedule follow-up visit on afternoon of procedure day (T, Th)     Type: Face-to-face (F2F) Post-procedure (PP) evaluation (E/M)     When: 2 weeks from now   Chronic Opioid Analgesic:  Hydrocodone/APAP 5/325, 1 tab PO q 8 hrs (15 mg/day of hydrocodone) MME/day: 15 mg/day.   Medications ordered for procedure: Meds ordered this encounter  Medications   lidocaine (XYLOCAINE) 2 % (with pres) injection 400 mg   pentafluoroprop-tetrafluoroeth (GEBAUERS) aerosol   ropivacaine (PF) 2 mg/mL (0.2%) (NAROPIN) injection 9 mL   triamcinolone acetonide (KENALOG-40) injection 40 mg   methylPREDNISolone acetate (DEPO-MEDROL) injection 80 mg   ropivacaine (PF) 2 mg/mL (0.2%) (NAROPIN) injection 4 mL   Medications administered: We administered lidocaine, pentafluoroprop-tetrafluoroeth, ropivacaine (PF) 2 mg/mL (0.2%), triamcinolone acetonide, methylPREDNISolone acetate, and ropivacaine (PF) 2 mg/mL (0.2%).  See the medical record for exact dosing, route, and time of administration.  Follow-up plan:   Return in about 2 weeks (around 03/15/2023) for Proc-day (T,Th), (Face2F), (PPE).       Interventional Therapies  Risk Factors  Considerations:      Planned  Pending:   Therapeutic right SI joint injection #4 (1st of 2024)  Therapeutic right Lumbar Facet MBB #5 (1st of 2024)  Therapeutic right IA steroid knee injection #4 (  01/10/2023)   Under consideration:   Therapeutic right SI joint injection #4  Diagnostic right genicular NB  Diagnostic bilateral cervical facet MBB  Therapeutic right L3 and L4 TFESI #1    Completed:   Therapeutic right superior/lateral trapezius muscle TPI x1 (04/19/2021)  Therapeutic right thoracic back TPI x2 (10/17/2018) (DKFUA)  Therapeutic left thoracic back TPI x1 (10/17/2018) (DKFUA)  Therapeutic right quadratus lumborum and erector spinae muscle TPI x1 (02/12/2018)  Therapeutic bilateral lumbar facet MBB (L2-S1) (FCT: L3-4,L4-5,L5-S1) x5 (08/09/2021) (100/100/50)   Therapeutic bilateral lumbar facet MBB (L4-S1) (FCT: L5-S1) x1 (08/29/2022) (100/100/100 x3 days/R:0L:100)  Therapeutic right lumbar facet MBB (L2-S1) x4 (12/21/2020) (DKFUA)  Therapeutic right lumbar facet RFA (L2-S1) x3 (09/28/2022) (100/100/75/75)  Therapeutic left lumbar facet RFA x2 (11/30/2022) (100/60/90/>75)  Diagnostic right SI joint Blk x3 (12/21/2020)  (DKFUA)  Diagnostic left SI joint Blk x1 (08/09/2017) (100/90/20/<25)  Therapeutic right SI joint RFA x3 (06/05/2019) (100/100/85/>50)  Therapeutic midline L2-3 LESI x1 (10/04/2017) (100/100/85/>50)  Therapeutic right L3-4 LESI x2 (11/04/2020) (100/100/20/<50)  Therapeutic right L4-5 LESI x2 (03/30/2022) (100/100/75/75)  Therapeutic right L5-S1 LESI x2 (12/04/2019) (100/100/50/75)  Diagnostic/therapeutic right L3 and L4 TFESI x1 (07/27/2022) (100/100/75/50)  Therapeutic right L5 TFESI x4 (02/14/2022) (100/100/70/75)  Therapeutic right S1 TFESI x1 (11/04/2020) (100/100/20/<50)  Diagnostic bilateral superficial trochanteric bursa injec. x1 (04/30/2018) (DKFUA)  Diagnostic right IA hip injection x1 (02/13/2019) (DKFUA)  Diagnostic/therapeutic right IA steroid knee inj. x3 (08/15/2022) (100/100/100/80)  Therapeutic right ([place]) knee injection x1 (10/06/2021)  (DKFUA)  Therapeutic right (Monovisc) knee injection x1 (06/07/2021) (100/100/100/75)    Therapeutic  Palliative (PRN) options:   Therapeutic/palliative lumbar facet RFA  Therapeutic/palliative lumbar facet MBB  Therapeutic left L2 TFESI (this level is responsible for her low back pain and left lower extremity pain.)   Pharmacotherapy  Nonopioids transfer 12/21/2020: Gabapentin        Recent Visits Date Type Provider Dept  02/19/23 Office Visit Wanda Pointer, MD [facility]  01/10/23 Office Visit Wanda Pointer, MD [facility]  Showing recent visits within past 90 days and meeting all other requirements Today's Visits Date Type Provider Dept   03/01/23 Procedure visit Wanda Pointer, MD [facility]  Showing today's visits and meeting all other requirements Future Appointments Date Type Provider Dept  03/15/23 Appointment Wanda Pointer, MD [facility]  05/23/23 Appointment Wanda Pointer, MD [facility]  Showing future appointments within next 90 days and meeting all other requirements  Disposition: Discharge home  Discharge (Date  Time): 03/01/2023; 1116 hrs.   Primary Care Physician: Wanda Pheasant, MD Location: [facility] Note by: Wanda Cola, MD (TTS technology used. I apologize for any typographical errors that were not detected and corrected.) Date: 03/01/2023; Time: 11:20 AM  Disclaimer:  Medicine is not an Chief Strategy Officer. The only guarantee in medicine is that nothing is guaranteed. It is important to note that the decision to proceed with this intervention was based on the information collected from the patient. The Data and conclusions were drawn from the patient's questionnaire, the interview, and the physical examination. Because the information was provided in large part by the patient, it cannot be guaranteed that it has not been purposely or unconsciously manipulated. Every effort has been made to obtain as much relevant data as possible for this evaluation. It is important to note that the conclusions that lead to this procedure are derived in large part from the available data. Always take into account that the treatment will  also be dependent on availability of resources and existing treatment guidelines, considered by other Pain Management Practitioners as being common knowledge and practice, at the time of the intervention. For Medico-Legal purposes, it is also important to point out that variation in procedural techniques and pharmacological choices are the acceptable norm. The indications, contraindications, technique, and results of the  above procedure should only be interpreted and judged by a Board-Certified Interventional Pain Specialist with extensive familiarity and expertise in the same exact procedure and technique.

## 2023-03-01 NOTE — Progress Notes (Signed)
Safety precautions to be maintained throughout the outpatient stay will include: orient to surroundings, keep bed in low position, maintain call bell within reach at all times, provide assistance with transfer out of bed and ambulation.  

## 2023-03-02 ENCOUNTER — Telehealth: Payer: Self-pay | Admitting: *Deleted

## 2023-03-02 NOTE — Telephone Encounter (Signed)
Post procedure call;  voicemail left.   

## 2023-03-07 ENCOUNTER — Inpatient Hospital Stay: Payer: Medicare HMO | Admitting: Internal Medicine

## 2023-03-07 NOTE — Progress Notes (Deleted)
Subjective:    Patient ID: Wanda Martinez, female    DOB: 1935/12/15, 88 y.o.   MRN: [id number]  Patient here for No chief complaint on file.   HPI Here for hospital follow up.  Admitted 02/26/23 - 02/27/23 with emesis and weakness.  Witnessed fall. PTA - mechanical fall with head strike.  No LOC. CT head unremarkable. Second fall occurred in setting of nausea/vomiting after eating chicken salad that did not "sit well".  During hospitalization, found to have AKI. Given IVFs and medications adjusted. Discharge creatinine - 1.74.  Flexeril, gabapentin, hctz and metoprolol were stopped prior to discharge. Hgb found to be 7.4.  received one unit of pRBCs.  Negative CT abdomen and pelvis. Discharged home.   Had f/u with pain clinic 03/01/23 - injection.   Past Medical History:  Diagnosis Date   Acute postoperative pain 10/25/2017   Anemia    Arm pain 07/26/2015   Arthritis    Arthritis, degenerative 03/26/2014   Back pain 11/01/2013   Bladder infection 06/2018   Breast cancer ([place])    Masectomy - left - 1986    Breast cancer [facility])    Mastectomy-right -2014   CHEST PAIN 04/29/2010   Qualifier: Diagnosis of  By: Wynetta Emery RN, Erika     Chronic cystitis    Cystocele 02/22/2013   Cystocele, midline 08/19/2013   Degeneration of intervertebral disc of lumbosacral region 03/26/2014   DYSPNEA 04/29/2010   Qualifier: Diagnosis of  By: Wynetta Emery RN, Erika     Enthesopathy of hip 03/26/2014   GERD (gastroesophageal reflux disease)    Hiatal hernia    HTN (hypertension)    Hypokalemia 06/2018   Hyponatremia 06/2018   LBP (low back pain) 03/26/2014   Neck pain 11/01/2013   Parkinson disease    Pneumonia 06/2018   Sinusitis 02/07/2015   Skin lesions 07/12/2014   Urinary incontinence    mixed    Past Surgical History:  Procedure Laterality Date   ABDOMINAL HYSTERECTOMY     BLEPHAROPLASTY     BREAST BIOPSY  2013   BREAST ENHANCEMENT SURGERY  1987   BREAST IMPLANT REMOVAL     BREAST IMPLANT REMOVAL  Right 08/29/2012   BREAST SURGERY  1986   s/p left mastectomy   COLONOSCOPY WITH PROPOFOL N/A 09/13/2016   Procedure: COLONOSCOPY WITH PROPOFOL;  Surgeon: Manya Silvas, MD;  Location: [place];  Service: Endoscopy;  Laterality: N/A;   MASTECTOMY  08/2012   right   TONSILLECTOMY AND ADENOIDECTOMY  1979   VESICOVAGINAL FISTULA CLOSURE W/ TAH  1983   Family History  Problem Relation Age of Onset   Diabetes Father    Stroke Father    Colon polyps Father    Stroke Mother    Parkinson's disease Mother    Social History   Socioeconomic History   Marital status: Married    Spouse name: Not on file   Number of children: 3   Years of education: Not on file   Highest education level: Not on file  Occupational History   Not on file  Tobacco Use   Smoking status: Never   Smokeless tobacco: Never   Tobacco comments:    tobacco use - no  Vaping Use   Vaping Use: Never used  Substance and Sexual Activity   Alcohol use: No    Alcohol/week: 0.0 standard drinks of alcohol   Drug use: No   Sexual activity: Never  Other Topics Concern   Not on file  Social History Narrative   Not on file   Social Determinants of Health   Financial Resource Strain: Low Risk  (07/10/2019)   Overall Financial Resource Strain (CARDIA)    Difficulty of Paying Living Expenses: Not hard at all  Food Insecurity: No Food Insecurity (07/10/2019)   Hunger Vital Sign    Worried About Running Out of Food in the Last Year: Never true    Ran Out of Food in the Last Year: Never true  Transportation Needs: No Transportation Needs (07/12/2020)   PRAPARE - Hydrologist (Medical): No    Lack of Transportation (Non-Medical): No  Physical Activity: Not on file  Stress: No Stress Concern Present (07/12/2020)   [place]    Feeling of Stress : Not at all  Social Connections: Socially Integrated (07/12/2020)   Social  Connection and Isolation Panel [NHANES]    Frequency of Communication with Friends and Family: Three times a week    Frequency of Social Gatherings with Friends and Family: More than three times a week    Attends Religious Services: More than 4 times per year    Active Member of Clubs or Organizations: Yes    Attends Archivist Meetings: 1 to 4 times per year    Marital Status: Married     Review of Systems     Objective:     LMP 12/18/1981  Wt Readings from Last 3 Encounters:  03/01/23 185 lb (83.9 kg)  02/19/23 190 lb (86.2 kg)  02/13/23 190 lb (86.2 kg)    Physical Exam   Outpatient Encounter Medications as of 03/07/2023  Medication Sig   amLODipine (NORVASC) 2.5 MG tablet Take 1 tablet (2.5 mg total) by mouth daily.   aspirin 81 MG EC tablet Take 81 mg by mouth daily as needed.   calcium carbonate (OSCAL) 1500 (600 Ca) MG TABS tablet Take 1 tablet by mouth daily.   citalopram (CELEXA) 20 MG tablet TAKE 1 TABLET BY MOUTH ONCE DAILY.   cyclobenzaprine (FLEXERIL) 10 MG tablet TAKE ONE TABLET BY MOUTH AT BEDTIME.   donepezil (ARICEPT) 10 MG tablet TAKE 1 TABLET BY MOUTH ONCE NIGHTLY.   erythromycin ophthalmic ointment Apply to eye.   gabapentin (NEURONTIN) 300 MG capsule TAKE (2) CAPSULES BY MOUTH TWICE DAILY   GLUCOSAMINE SULFATE PO Take by mouth daily.   Glucosamine-Chondroitin 500-400 MG CAPS Take 2 capsules by mouth daily.   HYDROcodone-acetaminophen (NORCO/VICODIN) 5-325 MG tablet Take 1 tablet by mouth 3 (three) times daily. Must last 30 days.   [START ON 03/30/2023] HYDROcodone-acetaminophen (NORCO/VICODIN) 5-325 MG tablet Take 1 tablet by mouth 3 (three) times daily. Must last 30 days.   [START ON 04/29/2023] HYDROcodone-acetaminophen (NORCO/VICODIN) 5-325 MG tablet Take 1 tablet by mouth 3 (three) times daily. Must last 30 days.   letrozole (FEMARA) 2.5 MG tablet Take 2.5 mg by mouth daily.   losartan (COZAAR) 50 MG tablet TAKE (2) TABLETS BY MOUTH ONCE  DAILY.   lubiprostone (AMITIZA) 24 MCG capsule TAKE 1 CAPSULE BY MOUTH ONCE DAILY WITH BREAKFAST.   magnesium oxide (MAG-OX) 400 (240 Mg) MG tablet TAKE 1 TABLET BY MOUTH ONCE DAILY.   melatonin 1 MG TABS tablet Take 3 mg by mouth at bedtime.   mirabegron ER (MYRBETRIQ) 50 MG TB24 tablet Take 1 tablet (50 mg total) by mouth daily.   Multiple Vitamin (MULTIVITAMIN) tablet Take 1 tablet by mouth daily.   naloxone [facility]) nasal  spray 4 mg/0.1 mL Place 1 spray into the nose as needed for up to 365 doses (for opioid-induced respiratory depresssion). In case of emergency (overdose), spray once into each nostril. If no response within 3 minutes, repeat application and call [id number].   Omega-3 1000 MG CAPS Take by mouth.   pantoprazole (PROTONIX) 40 MG tablet Take 1 tablet (40 mg total) by mouth daily.   rosuvastatin (CRESTOR) 20 MG tablet TAKE 1 TABLET BY MOUTH ONCE DAILY.   No facility-administered encounter medications on file as of 03/07/2023.     Lab Results  Component Value Date   WBC 4.1 02/13/2023   HGB 8.7 (L) 02/13/2023   HCT 27.1 (L) 02/13/2023   PLT 230 02/13/2023   GLUCOSE 80 02/13/2023   CHOL 123 01/31/2023   TRIG 112.0 01/31/2023   HDL 56.20 01/31/2023   LDLDIRECT 73.0 07/09/2018   LDLCALC 44 01/31/2023   ALT 27 02/13/2023   AST 35 02/13/2023   NA 137 02/13/2023   K 4.2 02/13/2023   CL 100 02/13/2023   CREATININE 1.59 (H) 02/13/2023   BUN 29 (H) 02/13/2023   CO2 29 02/13/2023   TSH 1.42 09/26/2022   HGBA1C 6.6 (H) 01/31/2023    DG PAIN CLINIC C-ARM 1-60 MIN NO REPORT  Result Date: 03/01/2023 Fluoro was used, but no Radiologist interpretation will be provided. Please refer to "NOTES" tab for provider progress note.      Assessment & Plan:  There are no diagnoses linked to this encounter.   Einar Pheasant, MD

## 2023-03-09 ENCOUNTER — Ambulatory Visit (INDEPENDENT_AMBULATORY_CARE_PROVIDER_SITE_OTHER): Payer: Medicare HMO | Admitting: Internal Medicine

## 2023-03-09 ENCOUNTER — Encounter: Payer: Self-pay | Admitting: Internal Medicine

## 2023-03-09 VITALS — BP 136/86 | HR 88 | Temp 98.2°F | Resp 16 | Ht 64.0 in | Wt 183.0 lb

## 2023-03-09 DIAGNOSIS — N1831 Chronic kidney disease, stage 3a: Secondary | ICD-10-CM | POA: Diagnosis not present

## 2023-03-09 DIAGNOSIS — F320 Major depressive disorder, single episode, mild: Secondary | ICD-10-CM

## 2023-03-09 DIAGNOSIS — Z853 Personal history of malignant neoplasm of breast: Secondary | ICD-10-CM

## 2023-03-09 DIAGNOSIS — I10 Essential (primary) hypertension: Secondary | ICD-10-CM | POA: Diagnosis not present

## 2023-03-09 DIAGNOSIS — M545 Low back pain, unspecified: Secondary | ICD-10-CM

## 2023-03-09 DIAGNOSIS — K219 Gastro-esophageal reflux disease without esophagitis: Secondary | ICD-10-CM | POA: Diagnosis not present

## 2023-03-09 DIAGNOSIS — R5383 Other fatigue: Secondary | ICD-10-CM | POA: Diagnosis not present

## 2023-03-09 DIAGNOSIS — I700 Atherosclerosis of aorta: Secondary | ICD-10-CM | POA: Diagnosis not present

## 2023-03-09 DIAGNOSIS — D649 Anemia, unspecified: Secondary | ICD-10-CM

## 2023-03-09 DIAGNOSIS — M810 Age-related osteoporosis without current pathological fracture: Secondary | ICD-10-CM

## 2023-03-09 DIAGNOSIS — W19XXXD Unspecified fall, subsequent encounter: Secondary | ICD-10-CM

## 2023-03-09 DIAGNOSIS — G8929 Other chronic pain: Secondary | ICD-10-CM

## 2023-03-09 DIAGNOSIS — R739 Hyperglycemia, unspecified: Secondary | ICD-10-CM

## 2023-03-09 DIAGNOSIS — E7800 Pure hypercholesterolemia, unspecified: Secondary | ICD-10-CM

## 2023-03-09 DIAGNOSIS — G20A1 Parkinson's disease without dyskinesia, without mention of fluctuations: Secondary | ICD-10-CM

## 2023-03-09 LAB — CBC WITH DIFFERENTIAL/PLATELET
Basophils Absolute: 0 10*3/uL (ref 0.0–0.1)
Basophils Relative: 0.5 % (ref 0.0–3.0)
Eosinophils Absolute: 0.1 10*3/uL (ref 0.0–0.7)
Eosinophils Relative: 0.6 % (ref 0.0–5.0)
HCT: 32.3 % — ABNORMAL LOW (ref 36.0–46.0)
Hemoglobin: 11.3 g/dL — ABNORMAL LOW (ref 12.0–15.0)
Lymphocytes Relative: 18.3 % (ref 12.0–46.0)
Lymphs Abs: 1.5 10*3/uL (ref 0.7–4.0)
MCHC: 34.9 g/dL (ref 30.0–36.0)
MCV: 89.7 fl (ref 78.0–100.0)
Monocytes Absolute: 0.8 10*3/uL (ref 0.1–1.0)
Monocytes Relative: 9.6 % (ref 3.0–12.0)
Neutro Abs: 5.8 10*3/uL (ref 1.4–7.7)
Neutrophils Relative %: 71 % (ref 43.0–77.0)
Platelets: 287 10*3/uL (ref 150.0–400.0)
RBC: 3.61 Mil/uL — ABNORMAL LOW (ref 3.87–5.11)
RDW: 16.9 % — ABNORMAL HIGH (ref 11.5–15.5)
WBC: 8.2 10*3/uL (ref 4.0–10.5)

## 2023-03-09 LAB — BASIC METABOLIC PANEL
BUN: 29 mg/dL — ABNORMAL HIGH (ref 6–23)
CO2: 27 mEq/L (ref 19–32)
Calcium: 10.3 mg/dL (ref 8.4–10.5)
Chloride: 101 mEq/L (ref 96–112)
Creatinine, Ser: 1.57 mg/dL — ABNORMAL HIGH (ref 0.40–1.20)
GFR: 29.92 mL/min — ABNORMAL LOW (ref >60.00)
Glucose, Bld: 85 mg/dL (ref 70–99)
Potassium: 4.5 mEq/L (ref 3.5–5.1)
Sodium: 136 mEq/L (ref 135–145)

## 2023-03-09 NOTE — Progress Notes (Unsigned)
Subjective:    Patient ID: Wanda Martinez, female    DOB: Apr 14, 1935, 88 y.o.   MRN: [id number]  Patient here for  Chief Complaint  Patient presents with   Medical Management of Chronic Issues    HPI Here for hospital follow up.  Admitted 02/26/23 - 3/12 24 - after presenting with emesis and weakness. Witnessed fall.  Had prior mechanical fall with head strike.  No LOC. Negative CT head. Fall just prior to admission - occurred in the setting of nausea/vomiting after eating chicken salad - that did not sit well. Was hydrated.  Negative CT abd/pelvis. Flexeril, hctz, gabapentin and metoprolol were stopped. Also hgb 8.7.  followed by hematology.  Recommended to continue epo infusion.    Past Medical History:  Diagnosis Date   Acute postoperative pain 10/25/2017   Anemia    Arm pain 07/26/2015   Arthritis    Arthritis, degenerative 03/26/2014   Back pain 11/01/2013   Bladder infection 06/2018   Breast cancer ([place])    Masectomy - left - 1986    Breast cancer [facility])    Mastectomy-right -2014   CHEST PAIN 04/29/2010   Qualifier: Diagnosis of  By: Wynetta Emery RN, Erika     Chronic cystitis    Cystocele 02/22/2013   Cystocele, midline 08/19/2013   Degeneration of intervertebral disc of lumbosacral region 03/26/2014   DYSPNEA 04/29/2010   Qualifier: Diagnosis of  By: Wynetta Emery RN, Erika     Enthesopathy of hip 03/26/2014   GERD (gastroesophageal reflux disease)    Hiatal hernia    HTN (hypertension)    Hypokalemia 06/2018   Hyponatremia 06/2018   LBP (low back pain) 03/26/2014   Neck pain 11/01/2013   Parkinson disease    Pneumonia 06/2018   Sinusitis 02/07/2015   Skin lesions 07/12/2014   Urinary incontinence    mixed    Past Surgical History:  Procedure Laterality Date   ABDOMINAL HYSTERECTOMY     BLEPHAROPLASTY     BREAST BIOPSY  2013   BREAST ENHANCEMENT SURGERY  1987   BREAST IMPLANT REMOVAL     BREAST IMPLANT REMOVAL Right 08/29/2012   BREAST SURGERY  1986   s/p left mastectomy    COLONOSCOPY WITH PROPOFOL N/A 09/13/2016   Procedure: COLONOSCOPY WITH PROPOFOL;  Surgeon: Manya Silvas, MD;  Location: [place];  Service: Endoscopy;  Laterality: N/A;   MASTECTOMY  08/2012   right   TONSILLECTOMY AND ADENOIDECTOMY  1979   VESICOVAGINAL FISTULA CLOSURE W/ TAH  1983   Family History  Problem Relation Age of Onset   Diabetes Father    Stroke Father    Colon polyps Father    Stroke Mother    Parkinson's disease Mother    Social History   Socioeconomic History   Marital status: Married    Spouse name: Not on file   Number of children: 3   Years of education: Not on file   Highest education level: Not on file  Occupational History   Not on file  Tobacco Use   Smoking status: Never   Smokeless tobacco: Never   Tobacco comments:    tobacco use - no  Vaping Use   Vaping Use: Never used  Substance and Sexual Activity   Alcohol use: No    Alcohol/week: 0.0 standard drinks of alcohol   Drug use: No   Sexual activity: Never  Other Topics Concern   Not on file  Social History Narrative   Not on file  Social Determinants of Health   Financial Resource Strain: Low Risk  (07/10/2019)   Overall Financial Resource Strain (CARDIA)    Difficulty of Paying Living Expenses: Not hard at all  Food Insecurity: No Food Insecurity (07/10/2019)   Hunger Vital Sign    Worried About Running Out of Food in the Last Year: Never true    Ran Out of Food in the Last Year: Never true  Transportation Needs: No Transportation Needs (07/12/2020)   PRAPARE - Hydrologist (Medical): No    Lack of Transportation (Non-Medical): No  Physical Activity: Not on file  Stress: No Stress Concern Present (07/12/2020)   [place]    Feeling of Stress : Not at all  Social Connections: Socially Integrated (07/12/2020)   Social Connection and Isolation Panel [NHANES]    Frequency of  Communication with Friends and Family: Three times a week    Frequency of Social Gatherings with Friends and Family: More than three times a week    Attends Religious Services: More than 4 times per year    Active Member of Clubs or Organizations: Yes    Attends Archivist Meetings: 1 to 4 times per year    Marital Status: Married     Review of Systems     Objective:     BP (!) 142/90   Pulse 88   Temp 98.2 F (36.8 C)   Resp 16   Ht 5\' 4"  (1.626 m)   Wt 183 lb (83 kg)   LMP 12/18/1981   SpO2 98%   BMI 31.41 kg/m  Wt Readings from Last 3 Encounters:  03/09/23 183 lb (83 kg)  03/01/23 185 lb (83.9 kg)  02/19/23 190 lb (86.2 kg)    Physical Exam   Outpatient Encounter Medications as of 03/09/2023  Medication Sig   amLODipine (NORVASC) 2.5 MG tablet Take 1 tablet (2.5 mg total) by mouth daily.   aspirin 81 MG EC tablet Take 81 mg by mouth daily as needed.   calcium carbonate (OSCAL) 1500 (600 Ca) MG TABS tablet Take 1 tablet by mouth daily.   citalopram (CELEXA) 20 MG tablet TAKE 1 TABLET BY MOUTH ONCE DAILY.   cyclobenzaprine (FLEXERIL) 10 MG tablet TAKE ONE TABLET BY MOUTH AT BEDTIME.   donepezil (ARICEPT) 10 MG tablet TAKE 1 TABLET BY MOUTH ONCE NIGHTLY.   erythromycin ophthalmic ointment Apply to eye.   gabapentin (NEURONTIN) 300 MG capsule TAKE (2) CAPSULES BY MOUTH TWICE DAILY   GLUCOSAMINE SULFATE PO Take by mouth daily.   Glucosamine-Chondroitin 500-400 MG CAPS Take 2 capsules by mouth daily.   HYDROcodone-acetaminophen (NORCO/VICODIN) 5-325 MG tablet Take 1 tablet by mouth 3 (three) times daily. Must last 30 days.   [START ON 03/30/2023] HYDROcodone-acetaminophen (NORCO/VICODIN) 5-325 MG tablet Take 1 tablet by mouth 3 (three) times daily. Must last 30 days.   [START ON 04/29/2023] HYDROcodone-acetaminophen (NORCO/VICODIN) 5-325 MG tablet Take 1 tablet by mouth 3 (three) times daily. Must last 30 days.   letrozole (FEMARA) 2.5 MG tablet Take 2.5 mg by  mouth daily.   losartan (COZAAR) 50 MG tablet TAKE (2) TABLETS BY MOUTH ONCE DAILY.   lubiprostone (AMITIZA) 24 MCG capsule TAKE 1 CAPSULE BY MOUTH ONCE DAILY WITH BREAKFAST.   magnesium oxide (MAG-OX) 400 (240 Mg) MG tablet TAKE 1 TABLET BY MOUTH ONCE DAILY.   melatonin 1 MG TABS tablet Take 3 mg by mouth at bedtime.  mirabegron ER (MYRBETRIQ) 50 MG TB24 tablet Take 1 tablet (50 mg total) by mouth daily.   Multiple Vitamin (MULTIVITAMIN) tablet Take 1 tablet by mouth daily.   naloxone (NARCAN) nasal spray 4 mg/0.1 mL Place 1 spray into the nose as needed for up to 365 doses (for opioid-induced respiratory depresssion). In case of emergency (overdose), spray once into each nostril. If no response within 3 minutes, repeat application and call [id number].   Omega-3 1000 MG CAPS Take by mouth.   pantoprazole (PROTONIX) 40 MG tablet Take 1 tablet (40 mg total) by mouth daily.   rosuvastatin (CRESTOR) 20 MG tablet TAKE 1 TABLET BY MOUTH ONCE DAILY.   No facility-administered encounter medications on file as of 03/09/2023.     Lab Results  Component Value Date   WBC 4.1 02/13/2023   HGB 8.7 (L) 02/13/2023   HCT 27.1 (L) 02/13/2023   PLT 230 02/13/2023   GLUCOSE 80 02/13/2023   CHOL 123 01/31/2023   TRIG 112.0 01/31/2023   HDL 56.20 01/31/2023   LDLDIRECT 73.0 07/09/2018   LDLCALC 44 01/31/2023   ALT 27 02/13/2023   AST 35 02/13/2023   NA 137 02/13/2023   K 4.2 02/13/2023   CL 100 02/13/2023   CREATININE 1.59 (H) 02/13/2023   BUN 29 (H) 02/13/2023   CO2 29 02/13/2023   TSH 1.42 09/26/2022   HGBA1C 6.6 (H) 01/31/2023    DG PAIN CLINIC C-ARM 1-60 MIN NO REPORT  Result Date: 03/01/2023 Fluoro was used, but no Radiologist interpretation will be provided. Please refer to "NOTES" tab for provider progress note.      Assessment & Plan:  There are no diagnoses linked to this encounter.   Einar Pheasant, MD

## 2023-03-11 ENCOUNTER — Encounter: Payer: Self-pay | Admitting: Internal Medicine

## 2023-03-11 NOTE — Assessment & Plan Note (Signed)
Blood pressure as outlined. Had previously been on Hctz, metoprolol, amlodipine and losartan. Per note, hctz and metoprolol stopped while in hospital.  She took 1/2 hctz yesterday. Will hold on restarting at this time.  Check metabolic panel to reassess kidney function.  Have her monitor her blood pressure.  Continue amlodipine and losartan.  Send in pressures.  Adjust medication as needed.  Follow.

## 2023-03-11 NOTE — Assessment & Plan Note (Signed)
Is followed by pain clinic.  Flexeril and gabapentin stopped in hospital.  She did report right back/side pain.  Getting better. CT abd/pelvis as outlined.  Reproducible on exam.  Improving.  Follow.

## 2023-03-11 NOTE — Assessment & Plan Note (Signed)
No upper symptoms reported.  On protonix.   

## 2023-03-11 NOTE — Assessment & Plan Note (Signed)
Continue citalopram.  Overall appears to be handling things well.  Follow.   

## 2023-03-11 NOTE — Assessment & Plan Note (Signed)
Low carb diet and exercise.  Follow met b and a1c.   

## 2023-03-11 NOTE — Assessment & Plan Note (Signed)
On crestor.  Follow lipid panel and liver function tests.   

## 2023-03-11 NOTE — Assessment & Plan Note (Signed)
Sees hematology - Dr Grayland Ormond.  Following cbc. Recheck today.  Recommended retacrit if hgb remains below 10.

## 2023-03-11 NOTE — Assessment & Plan Note (Signed)
Recheck cbc and metabolic panel today.  Does feel better.

## 2023-03-11 NOTE — Assessment & Plan Note (Signed)
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  Patient completed 5 years of letrozole in May 2019.  No further interventions are needed.  Patient has had bilateral mastectomies, therefore no further mammograms are necessary.  ?

## 2023-03-11 NOTE — Assessment & Plan Note (Signed)
Recent fall as outlined.  First - mechanical fall - rushing and tripped.  Second "fall" occurred when sick and husband placed her on the floor. CT head, CT abd/pelvis reviewed.  Denies any significant residual problems.  Right side/back pain - reproducible on exam.  Getting better.  Discussed.  Will hold on further testing.

## 2023-03-11 NOTE — Assessment & Plan Note (Signed)
zometa - per Dr Finnegan  

## 2023-03-11 NOTE — Assessment & Plan Note (Signed)
Has seen neurology previously.  It appears that she was previously on sinemet, but this was stopped years ago given question of diagnosis of parkinsons.  She has been maintained on gabapentin - to help with tremors, pain, etc.  Has seen Dr Melrose Nakayama - neurology.  Given off gabapentin now, is requesting to restart sinemet.  After reviewing chart, will need to get her back in with neurology to confirm diagnosis and to help determine best treatment regimen.

## 2023-03-11 NOTE — Assessment & Plan Note (Signed)
Continue crestor 

## 2023-03-11 NOTE — Assessment & Plan Note (Signed)
Avoid antiinflammatories.  Stay hdrated.  Hydrated in hospital.  Improved from initial presentation.  Recheck metabolic panel today.

## 2023-03-12 ENCOUNTER — Encounter: Payer: Self-pay | Admitting: Urology

## 2023-03-12 ENCOUNTER — Ambulatory Visit: Payer: Medicare HMO | Admitting: Urology

## 2023-03-12 ENCOUNTER — Telehealth: Payer: Self-pay

## 2023-03-12 VITALS — BP 146/86 | HR 103 | Ht 64.0 in | Wt 183.0 lb

## 2023-03-12 DIAGNOSIS — N3020 Other chronic cystitis without hematuria: Secondary | ICD-10-CM | POA: Diagnosis not present

## 2023-03-12 DIAGNOSIS — N3941 Urge incontinence: Secondary | ICD-10-CM

## 2023-03-12 LAB — MICROSCOPIC EXAMINATION: Epithelial Cells (non renal): >10 /hpf — AB (ref 0–10)

## 2023-03-12 LAB — URINALYSIS, COMPLETE
Bilirubin, UA: NEGATIVE
Glucose, UA: NEGATIVE
Ketones, UA: NEGATIVE
Leukocytes,UA: NEGATIVE
Nitrite, UA: NEGATIVE
RBC, UA: NEGATIVE
Specific Gravity, UA: >1.03 — ABNORMAL HIGH (ref 1.005–1.030)
Urobilinogen, Ur: 0.2 mg/dL (ref 0.2–1.0)
pH, UA: 5.5 (ref 5.0–7.5)

## 2023-03-12 MED ORDER — MIRABEGRON ER 50 MG PO TB24: 50.0000 mg | ORAL_TABLET | Freq: Every day | ORAL | 0 refills | Status: DC

## 2023-03-12 NOTE — Telephone Encounter (Signed)
LMTCB

## 2023-03-12 NOTE — Progress Notes (Signed)
03/12/2023 1:30 PM   Wanda Martinez 10/27/1935 [id number]  Referring provider: Einar Pheasant, [id number] [place],  [place] [address]  Chief Complaint  Patient presents with   Follow-up    Discuss medication    HPI: I was consulted to assess the patient's recurrent bladder infections.  She gets frequency and burning that usually respond to antibiotics.  She gets 6 bladder infections a year.  She has had some positive negative cultures on review of medical records  When she is not infected she voids every 1-2 hours and gets up 3 times a night.  She has urge incontinence especially if she holds it too long.  She can might have mild stress incontinence.  She has moderate bedwetting  She has had a hysterectomy     Pathophysiology of chronic cystitis discussed.  Role of prophylaxis discussed.  Reassess for pelvic semination cystoscopy in 6 weeks with cystoscopy.  Call if ultrasound abnormal.  Call if urine culture abnormal.  Role of probiotics discussed.  See if mixed incontinence down regulate   Macrobid 100 mg 3x11 sent.   Today Frequency stable.  No hydronephrosis on ultrasound or stones.  Last culture negative.  No infection.  Clinically doing well   Normal cysto   Clinically doing well on urinary prophylaxis.  Reassess in 4 months.   Today Frequency stable.  She may have had 1 breakthrough when she had steroids for her back.  Otherwise does well.  Infection free today.   Today Infection free on Macrodantin.  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  Clinically otherwise not infected  Renew Macrodantin. Reassess in 6 weeks on Myrbetriq 50 mg samples and prescription. Call if culture positive   Today Patient's foot on the floor syndrome 50% better.  She went to the hospital with a lot of medical issues and dehydration and I think chronic renal insufficiency by do not think had a  positive culture.  I looked through the medical records in [place].  She does not think she had an infection.  Clinically infection free on daily Macrodantin.  Still on Myrbetriq samples    PMH: Past Medical History:  Diagnosis Date   Acute postoperative pain 10/25/2017   Anemia    Arm pain 07/26/2015   Arthritis    Arthritis, degenerative 03/26/2014   Back pain 11/01/2013   Bladder infection 06/2018   Breast cancer ([place])    Masectomy - left - 1986    Breast cancer [facility])    Mastectomy-right -2014   CHEST PAIN 04/29/2010   Qualifier: Diagnosis of  By: Wynetta Emery RN, Erika     Chronic cystitis    Cystocele 02/22/2013   Cystocele, midline 08/19/2013   Degeneration of intervertebral disc of lumbosacral region 03/26/2014   DYSPNEA 04/29/2010   Qualifier: Diagnosis of  By: Wynetta Emery RN, Erika     Enthesopathy of hip 03/26/2014   GERD (gastroesophageal reflux disease)    Hiatal hernia    HTN (hypertension)    Hypokalemia 06/2018   Hyponatremia 06/2018   LBP (low back pain) 03/26/2014   Neck pain 11/01/2013   Parkinson disease    Pneumonia 06/2018   Sinusitis 02/07/2015   Skin lesions 07/12/2014   Urinary incontinence    mixed     Surgical History: Past Surgical History:  Procedure Laterality Date   ABDOMINAL HYSTERECTOMY     BLEPHAROPLASTY  BREAST BIOPSY  2013   BREAST ENHANCEMENT SURGERY  1987   BREAST IMPLANT REMOVAL     BREAST IMPLANT REMOVAL Right 08/29/2012   BREAST SURGERY  1986   s/p left mastectomy   COLONOSCOPY WITH PROPOFOL N/A 09/13/2016   Procedure: COLONOSCOPY WITH PROPOFOL;  Surgeon: Manya Silvas, MD;  Location: [place];  Service: Endoscopy;  Laterality: N/A;   MASTECTOMY  08/2012   right   TONSILLECTOMY AND ADENOIDECTOMY  1979   VESICOVAGINAL FISTULA CLOSURE W/ TAH  1983    Home Medications:  Allergies as of 03/12/2023       Reactions   Sulfa Antibiotics Nausea Only   Vesicare [solifenacin] Other (See Comments)   Constipation Constipation         Medication List        Accurate as of March 12, 2023  1:30 PM. If you have any questions, ask your nurse or doctor.          amLODipine 2.5 MG tablet Commonly known as: NORVASC Take 1 tablet (2.5 mg total) by mouth daily.   aspirin EC 81 MG tablet Take 81 mg by mouth daily as needed.   calcium carbonate 1500 (600 Ca) MG Tabs tablet Commonly known as: OSCAL Take 1 tablet by mouth daily.   citalopram 20 MG tablet Commonly known as: CELEXA TAKE 1 TABLET BY MOUTH ONCE DAILY.   cyclobenzaprine 10 MG tablet Commonly known as: FLEXERIL TAKE ONE TABLET BY MOUTH AT BEDTIME.   donepezil 10 MG tablet Commonly known as: ARICEPT TAKE 1 TABLET BY MOUTH ONCE NIGHTLY.   erythromycin ophthalmic ointment Apply to eye.   gabapentin 300 MG capsule Commonly known as: NEURONTIN TAKE (2) CAPSULES BY MOUTH TWICE DAILY   GLUCOSAMINE SULFATE PO Take by mouth daily.   Glucosamine-Chondroitin 500-400 MG Caps Take 2 capsules by mouth daily.   HYDROcodone-acetaminophen 5-325 MG tablet Commonly known as: NORCO/VICODIN Take 1 tablet by mouth 3 (three) times daily. Must last 30 days.   HYDROcodone-acetaminophen 5-325 MG tablet Commonly known as: NORCO/VICODIN Take 1 tablet by mouth 3 (three) times daily. Must last 30 days. Start taking on: March 30, 2023   HYDROcodone-acetaminophen 5-325 MG tablet Commonly known as: NORCO/VICODIN Take 1 tablet by mouth 3 (three) times daily. Must last 30 days. Start taking on: Apr 29, 2023   letrozole 2.5 MG tablet Commonly known as: FEMARA Take 2.5 mg by mouth daily.   losartan 50 MG tablet Commonly known as: COZAAR TAKE (2) TABLETS BY MOUTH ONCE DAILY.   lubiprostone 24 MCG capsule Commonly known as: AMITIZA TAKE 1 CAPSULE BY MOUTH ONCE DAILY WITH BREAKFAST.   magnesium oxide 400 (240 Mg) MG tablet Commonly known as: MAG-OX TAKE 1 TABLET BY MOUTH ONCE DAILY.   melatonin 1 MG Tabs tablet Take 3 mg by mouth at bedtime.   mirabegron ER  50 MG Tb24 tablet Commonly known as: MYRBETRIQ Take 1 tablet (50 mg total) by mouth daily.   multivitamin tablet Take 1 tablet by mouth daily.   naloxone 4 MG/0.1ML Liqd nasal spray kit Commonly known as: NARCAN Place 1 spray into the nose as needed for up to 365 doses (for opioid-induced respiratory depresssion). In case of emergency (overdose), spray once into each nostril. If no response within 3 minutes, repeat application and call [id number].   Omega-3 1000 MG Caps Take by mouth.   pantoprazole 40 MG tablet Commonly known as: PROTONIX Take 1 tablet (40 mg total) by mouth daily.   rosuvastatin 20 MG  tablet Commonly known as: CRESTOR TAKE 1 TABLET BY MOUTH ONCE DAILY.        Allergies:  Allergies  Allergen Reactions   Sulfa Antibiotics Nausea Only   Vesicare [Solifenacin] Other (See Comments)    Constipation Constipation    Family History: Family History  Problem Relation Age of Onset   Diabetes Father    Stroke Father    Colon polyps Father    Stroke Mother    Parkinson's disease Mother     Social History:  reports that she has never smoked. She has never been exposed to tobacco smoke. She has never used smokeless tobacco. She reports that she does not drink alcohol and does not use drugs.  ROS:                                        Physical Exam: BP (!) 146/86   Pulse (!) 103   Ht 5\' 4"  (1.626 m)   Wt 83 kg   LMP 12/18/1981   BMI 31.41 kg/m   Constitutional:  Alert and oriented, No acute distress. HEENT: [place] AT, moist mucus membranes.  Trachea midline, no masses. Cardiovascular: No clubbing, cyanosis, or edema.   Laboratory Data: Lab Results  Component Value Date   WBC 8.2 03/09/2023   HGB 11.3 (L) 03/09/2023   HCT 32.3 (L) 03/09/2023   MCV 89.7 03/09/2023   PLT 287.0 03/09/2023    Lab Results  Component Value Date   CREATININE 1.57 (H) 03/09/2023    No results found for: "PSA"  No results found for:  "TESTOSTERONE"  Lab Results  Component Value Date   HGBA1C 6.6 (H) 01/31/2023    Urinalysis    Component Value Date/Time   COLORURINE YELLOW 03/17/2022 1009   APPEARANCEUR Hazy (A) 01/29/2023 1447   LABSPEC 1.010 03/17/2022 1009   LABSPEC 1.006 10/08/2014 1745   PHURINE 6.5 03/17/2022 1009   GLUCOSEU Negative 01/29/2023 1447   GLUCOSEU NEGATIVE 03/17/2022 1009   HGBUR NEGATIVE 03/17/2022 1009   BILIRUBINUR Negative 01/29/2023 1447   BILIRUBINUR Negative 10/08/2014 1745   KETONESUR NEGATIVE 03/17/2022 1009   PROTEINUR Trace (A) 01/29/2023 1447   PROTEINUR NEGATIVE 10/17/2019 1608   UROBILINOGEN 0.2 03/17/2022 1009   NITRITE Negative 01/29/2023 1447   NITRITE NEGATIVE 03/17/2022 1009   LEUKOCYTESUR Trace (A) 01/29/2023 1447   LEUKOCYTESUR TRACE (A) 03/17/2022 1009   LEUKOCYTESUR Trace 10/08/2014 1745    Pertinent Imaging:   Assessment & Plan: Send urine for culture.  I gave 8 more weeks of samples.  I did not have Gemtesa to try today and I want to try without co-pay at this stage.  Reassess in 8 weeks.  Call if culture positive.  She is improved at least 50%  1. Chronic cystitis  - Urinalysis, Complete   No follow-ups on file.  Reece Packer, MD  [facility] [address], [place], [place] [address] [phone]

## 2023-03-12 NOTE — Telephone Encounter (Signed)
-----   Message from Einar Pheasant, MD sent at 03/11/2023  8:31 AM EDT ----- Notify - hgb has improved from hospitalization.  Continue f/u with hematology. Kidney function stable from most recheck check.  Still decreased.  Per previous, already referred to nephrology.  Confirm has appt.  Also, she had expressed concern regarding shaking and clinching of her teeth.  She was taken off gabapentin during this last hospitalization.  She was questioning restarting sinemet.  She sees neurology (Dr Melrose Nakayama [facility]).  The sinemet (carbidopa/levadopa) was stopped secondary to the neurologist feeling she did not have parkinsons.  Given increased tremor and currently symptoms, I would like for her to f/u with Dr Melrose Nakayama for further evaluation and treatment options.

## 2023-03-14 ENCOUNTER — Inpatient Hospital Stay: Payer: Medicare HMO

## 2023-03-14 VITALS — BP 155/84

## 2023-03-14 DIAGNOSIS — Z853 Personal history of malignant neoplasm of breast: Secondary | ICD-10-CM | POA: Diagnosis not present

## 2023-03-14 DIAGNOSIS — N186 End stage renal disease: Secondary | ICD-10-CM | POA: Diagnosis not present

## 2023-03-14 DIAGNOSIS — M810 Age-related osteoporosis without current pathological fracture: Secondary | ICD-10-CM

## 2023-03-14 DIAGNOSIS — D631 Anemia in chronic kidney disease: Secondary | ICD-10-CM | POA: Diagnosis not present

## 2023-03-14 DIAGNOSIS — D649 Anemia, unspecified: Secondary | ICD-10-CM

## 2023-03-14 DIAGNOSIS — C50211 Malignant neoplasm of upper-inner quadrant of right female breast: Secondary | ICD-10-CM

## 2023-03-14 LAB — BASIC METABOLIC PANEL
Anion gap: 8 (ref 5–15)
BUN: 18 mg/dL (ref 8–23)
CO2: 21 mmol/L — ABNORMAL LOW (ref 22–32)
Calcium: 8.6 mg/dL — ABNORMAL LOW (ref 8.9–10.3)
Chloride: 103 mmol/L (ref 98–111)
Creatinine, Ser: 1.4 mg/dL — ABNORMAL HIGH (ref 0.44–1.00)
GFR, Estimated: 37 mL/min — ABNORMAL LOW (ref >60)
Glucose, Bld: 92 mg/dL (ref 70–99)
Potassium: 3.4 mmol/L — ABNORMAL LOW (ref 3.5–5.1)
Sodium: 132 mmol/L — ABNORMAL LOW (ref 135–145)

## 2023-03-14 LAB — CBC WITH DIFFERENTIAL/PLATELET
Abs Immature Granulocytes: 0.03 10*3/uL (ref 0.00–0.07)
Basophils Absolute: 0.1 10*3/uL (ref 0.0–0.1)
Basophils Relative: 1 %
Eosinophils Absolute: 0 10*3/uL (ref 0.0–0.5)
Eosinophils Relative: 0 %
HCT: 30.4 % — ABNORMAL LOW (ref 36.0–46.0)
Hemoglobin: 9.8 g/dL — ABNORMAL LOW (ref 12.0–15.0)
Immature Granulocytes: 0 %
Lymphocytes Relative: 17 %
Lymphs Abs: 1.4 10*3/uL (ref 0.7–4.0)
MCH: 30.2 pg (ref 26.0–34.0)
MCHC: 32.2 g/dL (ref 30.0–36.0)
MCV: 93.5 fL (ref 80.0–100.0)
Monocytes Absolute: 0.8 10*3/uL (ref 0.1–1.0)
Monocytes Relative: 9 %
Neutro Abs: 6.1 10*3/uL (ref 1.7–7.7)
Neutrophils Relative %: 73 %
Platelets: 223 10*3/uL (ref 150–400)
RBC: 3.25 MIL/uL — ABNORMAL LOW (ref 3.87–5.11)
RDW: 17.4 % — ABNORMAL HIGH (ref 11.5–15.5)
WBC: 8.4 10*3/uL (ref 4.0–10.5)
nRBC: 0 % (ref 0.0–0.2)

## 2023-03-14 LAB — CULTURE, URINE COMPREHENSIVE

## 2023-03-14 MED ORDER — EPOETIN ALFA-EPBX 40000 UNIT/ML IJ SOLN
40000.0000 [IU] | Freq: Once | INTRAMUSCULAR | Status: AC
  Administered 2023-03-14: 40000 [IU] via SUBCUTANEOUS
  Filled 2023-03-14: qty 1

## 2023-03-14 NOTE — Progress Notes (Unsigned)
PROVIDER NOTE: Information contained herein reflects review and annotations entered in association with encounter. Interpretation of such information and data should be left to medically-trained personnel. Information provided to patient can be located elsewhere in the medical record under "Patient Instructions". Document created using STT-dictation technology, any transcriptional errors that may result from process are unintentional.    Patient: Wanda Martinez  Service Category: E/M  Provider: Gaspar Cola, MD  DOB: Oct 05, 1935  DOS: 03/15/2023  Referring Provider: Einar Pheasant, MD  MRN: [id number]  Specialty: Interventional Pain Management  PCP: Einar Pheasant, MD  Type: Established Patient  Setting: Ambulatory outpatient    Location: Office  Delivery: Face-to-face     HPI  [name], a 88 y.o. year old female, is here today because of her No primary diagnosis found.. [name] primary complain today is No chief complaint on file.  Pertinent problems: [name] has Headache; Parkinson's disease; Chronic low back pain (1ry area of Pain) (Bilateral) (R>L) w/o sciatica; Lumbar spondylosis; Chronic hip pain (Bilateral); Chronic neck pain; Cervical spondylosis; Chronic cervical radicular pain (Right); Diffuse myofascial pain syndrome; Neurogenic pain; Chronic upper back pain (Right); Myofascial pain syndrome (Right) (cervicothoracic); Lumbar facet syndrome (Bilateral) (R>L); History of breast cancer; Cervical facet hypertrophy; Cervical facet syndrome ([place]); Chronic shoulder pain (Right); Chronic pain syndrome; Chronic sacroiliac joint pain (Left); Chronic sacroiliac joint pain (Right); 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, L4-5, L5-S1) (Right); Lumbar spinal stenosis (with neurogenic claudication) (L3-4); Chronic lower extremity pain (2ry area of Pain) (Right); Chronic lumbar radicular pain (S1) (Right); Trochanteric bursitis of hip (Bilateral); Spondylosis without myelopathy  or radiculopathy, lumbosacral region; Trigger point with back pain (Right); DDD (degenerative disc disease), lumbosacral; Chronic upper extremity pain (Right); Chronic thoracic back pain (Bilateral) (L>R); Trigger point of thoracic region (Bilateral) (L>R); Other specified dorsopathies, sacral and sacrococcygeal region; Sacroiliac joint dysfunction (Right); Osteoarthritis of sacroiliac joint (Right); Somatic dysfunction of sacroiliac joint (Right); Chronic musculoskeletal pain; Facial pain; Chronic hip pain (Right); Osteoarthritis of hip (Right); Left ear pain; Abnormal MRI, lumbar spine (08/23/2022); Osteoarthritis involving multiple joints; Other spondylosis, sacral and sacrococcygeal region; Lumbar facet hypertrophy (Multilevel) (Bilateral); Chronic knee pain (Right); Osteoarthritis of knee (Right); Trigger point of shoulder region (Right); Osteoarthritis of AC (acromioclavicular) joint (Right); Osteoarthritis of shoulder (Right); Unspecified injury of muscle(s) and tendon(s) of the rotator cuff of shoulder, sequela (Right); Cervicalgia; DDD (degenerative disc disease), lumbar; Lumbar radicular pain (Bilateral); Lumbar foraminal stenosis (Right: L3-4, L4-5, L5-S1) (Left: L2-3); Abnormal MRI, hip (Right) (05/04/2022); Abnormal MRI, knee (01/04/2022); Tricompartment osteoarthritis of knee (Right); and Chronic low back pain (Right) w/o sciatica on their pertinent problem list. Pain Assessment: Severity of   is reported as a  /10. Location:    / . Onset:  . Quality:  . Timing:  . Modifying factor(s):  Marland Kitchen Vitals:  vitals were not taken for this visit.  BMI: Estimated body mass index is 31.41 kg/m as calculated from the following:   Height as of 03/12/23: 5\' 4"  (1.626 m).   Weight as of 03/12/23: 183 lb (83 kg). Last encounter: 02/19/2023. Last procedure: 03/01/2023.  Reason for encounter: post-procedure evaluation and assessment. ***  Post-procedure evaluation   Procedure: Lumbar Facet, Medial Branch Block(s) #7   (last diagnostic done on 08/29/2022) (last RFA done 09/28/2022) Laterality: Right  Level(s): L3, L4, L5, and S1 Medial Branch Level(s).  Rationale: Injecting these levels should provide temporary sensory nerve conduction blockade of the L4-5 and L5-S1 lumbar facet (zygapophyseal) joints. Target: Terminal medial branch nerve  division of lumbosacral nerve root dorsal rami.  Procedure No.2: Sacroiliac joint injection #4 (last diagnostic done 12/21/2020) (last RFA done 06/05/2019) Laterality: Right     Approach: Inferior postero-medial percutaneous approach. Level: PIIS (Posterior Inferior Iliac Spine) Sacroiliac Joint Target: For lower sacroiliac joint block(s), the target is the inferior and posterior margin of the sacroiliac joint.  Imaging: Fluoroscopic guidance         Anesthesia: Local anesthesia (1-2% Lidocaine) Anxiolysis: None                 Sedation: No Sedation                       DOS: 03/01/2023 Performed by: Gaspar Cola, MD  Purpose: Diagnostic/Therapeutic Indications: Low back pain severe enough to impact quality of life or function. Medical necessity rationale: procedure needed and proper for the diagnosis and/or treatment of the patient's medical symptoms and needs. 1. Chronic low back pain (Right) w/o sciatica   2. Lumbar facet syndrome (Bilateral) (R>L)   3. Lumbar facet hypertrophy (Multilevel) (Bilateral)   4. Spondylosis without myelopathy or radiculopathy, lumbosacral region   5. Chronic sacroiliac joint pain (Right)   6. Osteoarthritis of sacroiliac joint (Right)   7. Other spondylosis, sacral and sacrococcygeal region   8. Somatic dysfunction of sacroiliac joint (Right)   9. Sacroiliac joint dysfunction (Right)   10. DDD (degenerative disc disease), lumbosacral    NAS-11 Pain score:   Pre-procedure: 5 /10   Post-procedure: 0-No pain/10      Effectiveness:  Initial hour after procedure:   ***. Subsequent 4-6 hours post-procedure:   ***. Analgesia  past initial 6 hours:   ***. Ongoing improvement:  Analgesic:  *** Function:    ***    ROM:    ***     Pharmacotherapy Assessment  Analgesic: Hydrocodone/APAP 5/325, 1 tab PO q 8 hrs (15 mg/day of hydrocodone) MME/day: 15 mg/day.   Monitoring: [place] PMP: PDMP not reviewed this encounter.       Pharmacotherapy: No side-effects or adverse reactions reported. Compliance: No problems identified. Effectiveness: Clinically acceptable.  No notes on file  No results found for: "CBDTHCR" No results found for: "D8THCCBX" No results found for: "D9THCCBX"  UDS:  Summary  Date Value Ref Range Status  06/12/2022 Note  Final    Comment:    ==================================================================== [facility] (MW) ==================================================================== Test                             Result       Flag       Units  Drug Present and Declared for Prescription Verification   Hydrocodone                    1766         EXPECTED   ng/mg creat   Dihydrocodeine                 303          EXPECTED   ng/mg creat   Norhydrocodone                 1597         EXPECTED   ng/mg creat    Sources of hydrocodone include scheduled prescription medications.    Dihydrocodeine and norhydrocodone are expected metabolites of    hydrocodone. Dihydrocodeine is also available as a scheduled  prescription medication.  ==================================================================== Test                      Result    Flag   Units      Ref Range   Creatinine              38               mg/dL      >=20 ==================================================================== Declared Medications:  The flagging and interpretation on this report are based on the  following declared medications.  Unexpected results may arise from  inaccuracies in the declared medications.   **Note: The testing scope of this panel includes these medications:   Hydrocodone  ([place]co)   **Note: The testing scope of this panel does not include the  following reported medications:   Acetaminophen (Norco)  Amlodipine  Aspirin  Calcium  Chondroitin  Citalopram (Celexa)  Cyclobenzaprine (Flexeril)  Donepezil (Aricept)  Gabapentin (Neurontin)  Glucosamine  Letrozole (Femara)  Losartan (Cozaar)  Lubiprostone (Amitiza)  Magnesium (Mag-Ox)  Melatonin  Meloxicam (Mobic)  Metoprolol  Multivitamin  Nitrofurantoin (Macrobid)  Omega-3 Fatty Acids  Omeprazole  Pantoprazole (Protonix)  Rosuvastatin (Crestor) ==================================================================== For clinical consultation, please call [phone]. ====================================================================       ROS  Constitutional: Denies any fever or chills Gastrointestinal: No reported hemesis, hematochezia, vomiting, or acute GI distress Musculoskeletal: Denies any acute onset joint swelling, redness, loss of ROM, or weakness Neurological: No reported episodes of acute onset apraxia, aphasia, dysarthria, agnosia, amnesia, paralysis, loss of coordination, or loss of consciousness  Medication Review  Glucosamine Sulfate, Glucosamine-Chondroitin, HYDROcodone-acetaminophen, Omega-3, amLODipine, aspirin EC, calcium carbonate, citalopram, cyclobenzaprine, donepezil, erythromycin, gabapentin, letrozole, losartan, lubiprostone, magnesium oxide, melatonin, mirabegron ER, multivitamin, naloxone, pantoprazole, and rosuvastatin  History Review  Allergy: [name] is allergic to sulfa antibiotics and vesicare [solifenacin]. Drug: [name]  reports no history of drug use. Alcohol:  reports no history of alcohol use. Tobacco:  reports that she has never smoked. She has never been exposed to tobacco smoke. She has never used smokeless tobacco. Social: [name]  reports that she has never smoked. She has never been exposed to tobacco smoke. She has never used smokeless  tobacco. She reports that she does not drink alcohol and does not use drugs. Medical:  has a past medical history of Acute postoperative pain (10/25/2017), Anemia, Arm pain (07/26/2015), Arthritis, Arthritis, degenerative (03/26/2014), Back pain (11/01/2013), Bladder infection (06/2018), Breast cancer ([place]), Breast cancer ([place]), CHEST PAIN (04/29/2010), Chronic cystitis, Cystocele (02/22/2013), Cystocele, midline (08/19/2013), Degeneration of intervertebral disc of lumbosacral region (03/26/2014), DYSPNEA (04/29/2010), Enthesopathy of hip (03/26/2014), GERD (gastroesophageal reflux disease), Hiatal hernia, HTN (hypertension), Hypokalemia (06/2018), Hyponatremia (06/2018), LBP (low back pain) (03/26/2014), Neck pain (11/01/2013), Parkinson disease, Pneumonia (06/2018), Sinusitis (02/07/2015), Skin lesions (07/12/2014), and Urinary incontinence. Surgical: [name]  has a past surgical history that includes Tonsillectomy and adenoidectomy (1979); Vesicovaginal fistula closure w/ TAH (1983); Breast surgery (1986); Breast enhancement surgery (1987); Breast implant removal; Breast implant removal (Right, 08/29/2012); Mastectomy (08/2012); Abdominal hysterectomy; Colonoscopy with propofol (N/A, 09/13/2016); Breast biopsy (2013); and Blepharoplasty. Family: family history includes Colon polyps in her father; Diabetes in her father; Parkinson's disease in her mother; Stroke in her father and mother.  Laboratory Chemistry Profile   Renal Lab Results  Component Value Date   BUN 18 03/14/2023   CREATININE 1.40 (H) 03/14/2023   BCR 20 06/11/2020   GFR 29.92 (L) 03/09/2023   GFRAA >60 07/27/2020  GFRNONAA 37 (L) 03/14/2023    Hepatic Lab Results  Component Value Date   AST 35 02/13/2023   ALT 27 02/13/2023   ALBUMIN 4.1 02/13/2023   ALKPHOS 26 (L) 02/13/2023    Electrolytes Lab Results  Component Value Date   NA 132 (L) 03/14/2023   K 3.4 (L) 03/14/2023   CL 103 03/14/2023   CALCIUM 8.6 (L) 03/14/2023   MG 2.2  07/17/2018    Bone Lab Results  Component Value Date   VD25OH 66.95 09/26/2022   25OHVITD1 42 07/17/2018   25OHVITD2 <1.0 07/17/2018   25OHVITD3 42 07/17/2018    Inflammation (CRP: Acute Phase) (ESR: Chronic Phase) Lab Results  Component Value Date   CRP 3 07/17/2018   ESRSEDRATE 27 07/17/2018         Note: Above Lab results reviewed.  Recent Imaging Review  DG PAIN CLINIC C-ARM 1-60 MIN NO REPORT Fluoro was used, but no Radiologist interpretation will be provided.  Please refer to "NOTES" tab for provider progress note. Note: Reviewed        Physical Exam  General appearance: Well nourished, well developed, and well hydrated. In no apparent acute distress Mental status: Alert, oriented x 3 (person, place, & time)       Respiratory: No evidence of acute respiratory distress Eyes: PERLA Vitals: LMP 12/18/1981  BMI: Estimated body mass index is 31.41 kg/m as calculated from the following:   Height as of 03/12/23: 5\' 4"  (1.626 m).   Weight as of 03/12/23: 183 lb (83 kg). Ideal: Ideal body weight: 54.7 kg (120 lb 9.5 oz) Adjusted ideal body weight: 66 kg (145 lb 8.9 oz)  Assessment   Diagnosis Status  No diagnosis found. Controlled Controlled Controlled   Updated Problems: No problems updated.  Plan of Care  Problem-specific:  No problem-specific Assessment & Plan notes found for this encounter.  [name] has a current medication list which includes the following long-term medication(s): amlodipine, calcium carbonate, citalopram, cyclobenzaprine, donepezil, gabapentin, hydrocodone-acetaminophen, [START ON 03/30/2023] hydrocodone-acetaminophen, [START ON 04/29/2023] hydrocodone-acetaminophen, losartan, magnesium oxide, mirabegron er, mirabegron er, pantoprazole, and rosuvastatin.  Pharmacotherapy (Medications Ordered): No orders of the defined types were placed in this encounter.  Orders:  No orders of the defined types were placed in this  encounter.  Follow-up plan:   No follow-ups on file.      Interventional Therapies  Risk Factors  Considerations:      Planned  Pending:   Therapeutic right SI joint injection #4 (1st of 2024)  Therapeutic right Lumbar Facet MBB #5 (1st of 2024)  Therapeutic right IA steroid knee injection #4 (01/10/2023)   Under consideration:   Therapeutic right SI joint injection #4  Diagnostic right genicular NB  Diagnostic bilateral cervical facet MBB  Therapeutic right L3 and L4 TFESI #1    Completed:   Therapeutic right superior/lateral trapezius muscle TPI x1 (04/19/2021)  Therapeutic right thoracic back TPI x2 (10/17/2018) (DKFUA)  Therapeutic left thoracic back TPI x1 (10/17/2018) (DKFUA)  Therapeutic right quadratus lumborum and erector spinae muscle TPI x1 (02/12/2018)  Therapeutic bilateral lumbar facet MBB (L2-S1) (FCT: L3-4,L4-5,L5-S1) x5 (08/09/2021) (100/100/50)  Therapeutic bilateral lumbar facet MBB (L4-S1) (FCT: L5-S1) x1 (08/29/2022) (100/100/100 x3 days/R:0L:100)  Therapeutic right lumbar facet MBB (L2-S1) x4 (12/21/2020) (DKFUA)  Therapeutic right lumbar facet RFA (L2-S1) x3 (09/28/2022) (100/100/75/75)  Therapeutic left lumbar facet RFA x2 (11/30/2022) (100/60/90/>75)  Diagnostic right SI joint Blk x3 (12/21/2020)  (DKFUA)  Diagnostic left SI joint Blk x1 (08/09/2017) (  100/90/20/<25)  Therapeutic right SI joint RFA x3 (06/05/2019) (100/100/85/>50)  Therapeutic midline L2-3 LESI x1 (10/04/2017) (100/100/85/>50)  Therapeutic right L3-4 LESI x2 (11/04/2020) (100/100/20/<50)  Therapeutic right L4-5 LESI x2 (03/30/2022) (100/100/75/75)  Therapeutic right L5-S1 LESI x2 (12/04/2019) (100/100/50/75)  Diagnostic/therapeutic right L3 and L4 TFESI x1 (07/27/2022) (100/100/75/50)  Therapeutic right L5 TFESI x4 (02/14/2022) (100/100/70/75)  Therapeutic right S1 TFESI x1 (11/04/2020) (100/100/20/<50)  Diagnostic bilateral superficial trochanteric bursa injec. x1 (04/30/2018) (DKFUA)   Diagnostic right IA hip injection x1 (02/13/2019) (DKFUA)  Diagnostic/therapeutic right IA steroid knee inj. x3 (08/15/2022) (100/100/100/80)  Therapeutic right ([place]) knee injection x1 (10/06/2021)  (DKFUA)  Therapeutic right ([place]) knee injection x1 (06/07/2021) (100/100/100/75)    Therapeutic  Palliative (PRN) options:   Therapeutic/palliative lumbar facet RFA  Therapeutic/palliative lumbar facet MBB  Therapeutic left L2 TFESI (this level is responsible for her low back pain and left lower extremity pain.)   Pharmacotherapy  Nonopioids transfer 12/21/2020: Gabapentin         Recent Visits Date Type Provider Dept  03/01/23 Procedure visit Milinda Pointer, MD [facility]  02/19/23 Office Visit Milinda Pointer, MD [facility]  01/10/23 Office Visit Milinda Pointer, MD [facility]  Showing recent visits within past 90 days and meeting all other requirements Future Appointments Date Type Provider Dept  03/15/23 Appointment Milinda Pointer, MD [facility]  05/23/23 Appointment Milinda Pointer, MD [facility]  Showing future appointments within next 90 days and meeting all other requirements  I discussed the assessment and treatment plan with the patient. The patient was provided an opportunity to ask questions and all were answered. The patient agreed with the plan and demonstrated an understanding of the instructions.  Patient advised to call back or seek an in-person evaluation if the symptoms or condition worsens.  Duration of encounter: *** minutes.  Total time on encounter, as per AMA guidelines included both the face-to-face and non-face-to-face time personally spent by the physician and/or other qualified health care professional(s) on the day of the encounter (includes time in activities that require the physician or other qualified health care professional and does not include time in activities normally  performed by clinical staff). Physician's time may include the following activities when performed: Preparing to see the patient (e.g., pre-charting review of records, searching for previously ordered imaging, lab work, and nerve conduction tests) Review of prior analgesic pharmacotherapies. Reviewing PMP Interpreting ordered tests (e.g., lab work, imaging, nerve conduction tests) Performing post-procedure evaluations, including interpretation of diagnostic procedures Obtaining and/or reviewing separately obtained history Performing a medically appropriate examination and/or evaluation Counseling and educating the patient/family/caregiver Ordering medications, tests, or procedures Referring and communicating with other health care professionals (when not separately reported) Documenting clinical information in the electronic or other health record Independently interpreting results (not separately reported) and communicating results to the patient/ family/caregiver Care coordination (not separately reported)  Note by: Gaspar Cola, MD Date: 03/15/2023; Time: 12:31 PM

## 2023-03-15 ENCOUNTER — Ambulatory Visit: Payer: Medicare HMO | Attending: Pain Medicine | Admitting: Pain Medicine

## 2023-03-15 ENCOUNTER — Encounter: Payer: Self-pay | Admitting: Pain Medicine

## 2023-03-15 ENCOUNTER — Telehealth: Payer: Self-pay | Admitting: Internal Medicine

## 2023-03-15 VITALS — BP 152/84 | HR 88 | Temp 97.3°F | Resp 18 | Ht 64.0 in | Wt 183.0 lb

## 2023-03-15 DIAGNOSIS — R251 Tremor, unspecified: Secondary | ICD-10-CM

## 2023-03-15 DIAGNOSIS — M25511 Pain in right shoulder: Secondary | ICD-10-CM | POA: Insufficient documentation

## 2023-03-15 DIAGNOSIS — G894 Chronic pain syndrome: Secondary | ICD-10-CM | POA: Diagnosis not present

## 2023-03-15 DIAGNOSIS — Z79899 Other long term (current) drug therapy: Secondary | ICD-10-CM | POA: Diagnosis not present

## 2023-03-15 DIAGNOSIS — G249 Dystonia, unspecified: Secondary | ICD-10-CM

## 2023-03-15 DIAGNOSIS — G8929 Other chronic pain: Secondary | ICD-10-CM | POA: Insufficient documentation

## 2023-03-15 DIAGNOSIS — S46001S Unspecified injury of muscle(s) and tendon(s) of the rotator cuff of right shoulder, sequela: Secondary | ICD-10-CM | POA: Insufficient documentation

## 2023-03-15 DIAGNOSIS — Z79891 Long term (current) use of opiate analgesic: Secondary | ICD-10-CM | POA: Insufficient documentation

## 2023-03-15 DIAGNOSIS — M47816 Spondylosis without myelopathy or radiculopathy, lumbar region: Secondary | ICD-10-CM | POA: Diagnosis not present

## 2023-03-15 DIAGNOSIS — M545 Low back pain, unspecified: Secondary | ICD-10-CM | POA: Insufficient documentation

## 2023-03-15 DIAGNOSIS — M25561 Pain in right knee: Secondary | ICD-10-CM | POA: Insufficient documentation

## 2023-03-15 MED ORDER — HYDROCODONE-ACETAMINOPHEN 5-325 MG PO TABS: 1.0000 | ORAL_TABLET | Freq: Three times a day (TID) | ORAL | 0 refills | Status: DC

## 2023-03-15 NOTE — Progress Notes (Signed)
Safety precautions to be maintained throughout the outpatient stay will include: orient to surroundings, keep bed in low position, maintain call bell within reach at all times, provide assistance with transfer out of bed and ambulation.  

## 2023-03-15 NOTE — Telephone Encounter (Signed)
Alexander to confirm what is needed. She was wondering if patient is going to continue taking her gabapentin. She is aware that this was stopped at the hospital but advised patient she should take 1 per day instead of just stopping the medication abruptly. She was also asking about pt starting sinemet (Pt came into the pharmacy to discuss with them). Pharmacy is wondering if she is going to continue the gabapentin until decision is made whether to restart sinemet. Pt voiced concern at pharmacy about needing something for her shaking. Pt did report to pharmacy that gabapentin helps with this, even only taking 1 per day.

## 2023-03-15 NOTE — Telephone Encounter (Signed)
[place] called wanting to talk to the cma about the pt medication-gabapentin

## 2023-03-15 NOTE — Telephone Encounter (Signed)
LMTCB

## 2023-03-15 NOTE — Telephone Encounter (Signed)
Confirm she was previously on gabapentin 300mg  - 2 bid.  If so, ok to decrease to one q hs.  Will need to monitor symptoms and call with update - follow for any dizziness, lightheadedness, etc - with medication. Still needs neurology evaluation as outlined below.

## 2023-03-19 ENCOUNTER — Other Ambulatory Visit: Payer: Self-pay | Admitting: *Deleted

## 2023-03-19 DIAGNOSIS — N390 Urinary tract infection, site not specified: Secondary | ICD-10-CM

## 2023-03-19 DIAGNOSIS — N3020 Other chronic cystitis without hematuria: Secondary | ICD-10-CM

## 2023-03-19 MED ORDER — NITROFURANTOIN MONOHYD MACRO 100 MG PO CAPS: 100.0000 mg | ORAL_CAPSULE | Freq: Every day | ORAL | 3 refills | Status: DC

## 2023-03-19 NOTE — Addendum Note (Signed)
Addended by: Roetta Sessions D on: 03/19/2023 03:00 PM   Modules accepted: Orders

## 2023-03-19 NOTE — Telephone Encounter (Signed)
Pt should be on Macrobid per last OV with Macdiarmid  rx sent to pharmacy by e-script

## 2023-03-19 NOTE — Telephone Encounter (Signed)
Order placed for neurology referral.   

## 2023-03-19 NOTE — Addendum Note (Signed)
Addended by: Alisa Graff on: 03/19/2023 09:33 PM   Modules accepted: Orders

## 2023-03-19 NOTE — Telephone Encounter (Signed)
Yes and she was seeing Dr Melrose Nakayama [facility]

## 2023-03-19 NOTE — Telephone Encounter (Signed)
Patient was on gabapentin 300 mg- 2 bid. She is now taking 1 qhs. She has agreed to go back to neurology. I can refer her- just wanted to confirm dx code with you. Are we using tremors as the dx?

## 2023-03-19 NOTE — Telephone Encounter (Signed)
Referral pended. Please make sure info and diagnosis is correct

## 2023-03-19 NOTE — Telephone Encounter (Signed)
Pt returned Trisha LPN call. Transferred. 

## 2023-03-20 ENCOUNTER — Telehealth: Payer: Self-pay | Admitting: Internal Medicine

## 2023-03-20 ENCOUNTER — Other Ambulatory Visit: Payer: Self-pay

## 2023-03-20 MED ORDER — GABAPENTIN 300 MG PO CAPS: 300.0000 mg | ORAL_CAPSULE | Freq: Every day | ORAL | 0 refills | Status: DC

## 2023-03-20 NOTE — Telephone Encounter (Signed)
FYI for you- I sent in a 90 day script of gabapentin 300 mg with new directions for her to take 1 qhs based off of our previous discussion and discussion with patient. Neurology referral is in process.

## 2023-03-20 NOTE — Telephone Encounter (Signed)
Melissa from [place] called stating she was talking to the cma about restarting the gabapentin for pt or doing carb/levo

## 2023-03-26 DIAGNOSIS — M542 Cervicalgia: Secondary | ICD-10-CM | POA: Diagnosis not present

## 2023-03-26 DIAGNOSIS — M545 Low back pain, unspecified: Secondary | ICD-10-CM | POA: Diagnosis not present

## 2023-03-26 DIAGNOSIS — R29898 Other symptoms and signs involving the musculoskeletal system: Secondary | ICD-10-CM | POA: Diagnosis not present

## 2023-03-26 DIAGNOSIS — F32A Depression, unspecified: Secondary | ICD-10-CM | POA: Diagnosis not present

## 2023-03-26 DIAGNOSIS — R251 Tremor, unspecified: Secondary | ICD-10-CM | POA: Diagnosis not present

## 2023-03-26 DIAGNOSIS — R413 Other amnesia: Secondary | ICD-10-CM | POA: Diagnosis not present

## 2023-03-27 DIAGNOSIS — D631 Anemia in chronic kidney disease: Secondary | ICD-10-CM | POA: Diagnosis not present

## 2023-03-27 DIAGNOSIS — E876 Hypokalemia: Secondary | ICD-10-CM | POA: Insufficient documentation

## 2023-03-27 DIAGNOSIS — N1832 Chronic kidney disease, stage 3b: Secondary | ICD-10-CM | POA: Diagnosis not present

## 2023-03-27 DIAGNOSIS — E785 Hyperlipidemia, unspecified: Secondary | ICD-10-CM | POA: Diagnosis not present

## 2023-03-27 DIAGNOSIS — I10 Essential (primary) hypertension: Secondary | ICD-10-CM | POA: Diagnosis not present

## 2023-03-27 DIAGNOSIS — R8290 Unspecified abnormal findings in urine: Secondary | ICD-10-CM | POA: Diagnosis not present

## 2023-03-27 DIAGNOSIS — K219 Gastro-esophageal reflux disease without esophagitis: Secondary | ICD-10-CM | POA: Diagnosis not present

## 2023-03-27 DIAGNOSIS — E871 Hypo-osmolality and hyponatremia: Secondary | ICD-10-CM | POA: Diagnosis not present

## 2023-03-27 DIAGNOSIS — N39498 Other specified urinary incontinence: Secondary | ICD-10-CM | POA: Diagnosis not present

## 2023-03-27 DIAGNOSIS — D638 Anemia in other chronic diseases classified elsewhere: Secondary | ICD-10-CM | POA: Insufficient documentation

## 2023-04-09 ENCOUNTER — Ambulatory Visit (INDEPENDENT_AMBULATORY_CARE_PROVIDER_SITE_OTHER): Payer: Medicare HMO | Admitting: Internal Medicine

## 2023-04-09 ENCOUNTER — Encounter: Payer: Self-pay | Admitting: Internal Medicine

## 2023-04-09 VITALS — BP 130/80 | HR 82 | Temp 98.0°F | Resp 16 | Ht 64.0 in | Wt 182.6 lb

## 2023-04-09 DIAGNOSIS — F320 Major depressive disorder, single episode, mild: Secondary | ICD-10-CM | POA: Diagnosis not present

## 2023-04-09 DIAGNOSIS — I700 Atherosclerosis of aorta: Secondary | ICD-10-CM | POA: Diagnosis not present

## 2023-04-09 DIAGNOSIS — D649 Anemia, unspecified: Secondary | ICD-10-CM

## 2023-04-09 DIAGNOSIS — N1831 Chronic kidney disease, stage 3a: Secondary | ICD-10-CM

## 2023-04-09 DIAGNOSIS — Z853 Personal history of malignant neoplasm of breast: Secondary | ICD-10-CM | POA: Diagnosis not present

## 2023-04-09 DIAGNOSIS — E7800 Pure hypercholesterolemia, unspecified: Secondary | ICD-10-CM | POA: Diagnosis not present

## 2023-04-09 DIAGNOSIS — G20A1 Parkinson's disease without dyskinesia, without mention of fluctuations: Secondary | ICD-10-CM

## 2023-04-09 DIAGNOSIS — R739 Hyperglycemia, unspecified: Secondary | ICD-10-CM | POA: Diagnosis not present

## 2023-04-09 DIAGNOSIS — I10 Essential (primary) hypertension: Secondary | ICD-10-CM

## 2023-04-09 DIAGNOSIS — K219 Gastro-esophageal reflux disease without esophagitis: Secondary | ICD-10-CM

## 2023-04-09 LAB — CBC WITH DIFFERENTIAL/PLATELET
Basophils Absolute: 0.1 10*3/uL (ref 0.0–0.1)
Basophils Relative: 1.8 % (ref 0.0–3.0)
Eosinophils Absolute: 0.1 10*3/uL (ref 0.0–0.7)
Eosinophils Relative: 2.3 % (ref 0.0–5.0)
HCT: 30.1 % — ABNORMAL LOW (ref 36.0–46.0)
Hemoglobin: 10 g/dL — ABNORMAL LOW (ref 12.0–15.0)
Lymphocytes Relative: 27 % (ref 12.0–46.0)
Lymphs Abs: 1.2 10*3/uL (ref 0.7–4.0)
MCHC: 33.2 g/dL (ref 30.0–36.0)
MCV: 94.2 fl (ref 78.0–100.0)
Monocytes Absolute: 0.5 10*3/uL (ref 0.1–1.0)
Monocytes Relative: 10.4 % (ref 3.0–12.0)
Neutro Abs: 2.7 10*3/uL (ref 1.4–7.7)
Neutrophils Relative %: 58.5 % (ref 43.0–77.0)
Platelets: 237 10*3/uL (ref 150.0–400.0)
RBC: 3.19 Mil/uL — ABNORMAL LOW (ref 3.87–5.11)
RDW: 17.2 % — ABNORMAL HIGH (ref 11.5–15.5)
WBC: 4.6 10*3/uL (ref 4.0–10.5)

## 2023-04-09 LAB — BASIC METABOLIC PANEL
BUN: 17 mg/dL (ref 6–23)
CO2: 28 mEq/L (ref 19–32)
Calcium: 8.9 mg/dL (ref 8.4–10.5)
Chloride: 101 mEq/L (ref 96–112)
Creatinine, Ser: 1.05 mg/dL (ref 0.40–1.20)
GFR: 48.46 mL/min — ABNORMAL LOW (ref >60.00)
Glucose, Bld: 86 mg/dL (ref 70–99)
Potassium: 4 mEq/L (ref 3.5–5.1)
Sodium: 137 mEq/L (ref 135–145)

## 2023-04-09 LAB — VITAMIN B12: Vitamin B-12: 570 pg/mL (ref 211–911)

## 2023-04-09 LAB — IBC + FERRITIN
Ferritin: 129.4 ng/mL (ref 10.0–291.0)
Iron: 65 ug/dL (ref 42–145)
Saturation Ratios: 21.2 % (ref 20.0–50.0)
TIBC: 306.6 ug/dL (ref 250.0–450.0)
Transferrin: 219 mg/dL (ref 212.0–360.0)

## 2023-04-09 LAB — HEPATIC FUNCTION PANEL
ALT: 18 U/L (ref 0–35)
AST: 26 U/L (ref 0–37)
Albumin: 4.1 g/dL (ref 3.5–5.2)
Alkaline Phosphatase: 30 U/L — ABNORMAL LOW (ref 39–117)
Bilirubin, Direct: 0.1 mg/dL (ref 0.0–0.3)
Total Bilirubin: 0.4 mg/dL (ref 0.2–1.2)
Total Protein: 6.7 g/dL (ref 6.0–8.3)

## 2023-04-09 MED ORDER — METOPROLOL SUCCINATE ER 25 MG PO TB24: 25.0000 mg | ORAL_TABLET | Freq: Two times a day (BID) | ORAL | 1 refills | Status: DC

## 2023-04-09 MED ORDER — HYDROCORTISONE ACETATE 25 MG RE SUPP
25.0000 mg | Freq: Two times a day (BID) | RECTAL | 0 refills | Status: DC
Start: 2023-04-09 — End: 2023-09-13

## 2023-04-09 NOTE — Progress Notes (Signed)
Subjective:    Patient ID: Wanda Martinez, female    DOB: [birth year], 88 y.o.   MRN: [id number]  Patient here for  Chief Complaint  Patient presents with   Medical Management of Chronic Issues    HPI Here for follow up regarding hypertension- on amlodipine and losartan.  (Hctz and metoprolol stopped during recent hospitalization).  She reports blood pressure has been increased - since being home and off of these two medications.  No chest pain.  Breathing stable.  Saw urology 03/12/23 - continued myrbetriq samples.  Continues on daily macrodantin. Saw neurology - 03/26/23.  Did not feel symptoms were c/w parkinsons. Felt most c/w age related tremors.  Recommended starting on gabapentin.  Titrating up dose.  Her tremors are some better, but still with increased concerns regarding increase tremor.  Was previously on metoprolol as outlined for her blood pressure.  Discussed restarting metoprolol to help with her pressure and to see if helps with the tremor.    Past Medical History:  Diagnosis Date   Acute postoperative pain 10/25/2017   Anemia    Arm pain 07/26/2015   Arthritis    Arthritis, degenerative 03/26/2014   Back pain 11/01/2013   Bladder infection 06/2018   Breast cancer (HCC)    Masectomy - left - 1986    Breast cancer [facility])    Mastectomy-right -2014   CHEST PAIN 04/29/2010   Qualifier: Diagnosis of  By: Laural Benes RN, Erika     Chronic cystitis    Cystocele 02/22/2013   Cystocele, midline 08/19/2013   Degeneration of intervertebral disc of lumbosacral region 03/26/2014   DYSPNEA 04/29/2010   Qualifier: Diagnosis of  By: Laural Benes RN, Erika     Enthesopathy of hip 03/26/2014   GERD (gastroesophageal reflux disease)    Hiatal hernia    HTN (hypertension)    Hypokalemia 06/2018   Hyponatremia 06/2018   LBP (low back pain) 03/26/2014   Neck pain 11/01/2013   Parkinson disease    Pneumonia 06/2018   Sinusitis 02/07/2015   Skin lesions 07/12/2014   Urinary incontinence    mixed     Past Surgical History:  Procedure Laterality Date   ABDOMINAL HYSTERECTOMY     BLEPHAROPLASTY     BREAST BIOPSY  2013   BREAST ENHANCEMENT SURGERY  1987   BREAST IMPLANT REMOVAL     BREAST IMPLANT REMOVAL Right 08/29/2012   BREAST SURGERY  1986   s/p left mastectomy   COLONOSCOPY WITH PROPOFOL N/A 09/13/2016   Procedure: COLONOSCOPY WITH PROPOFOL;  Surgeon: Scot Jun, MD;  Location: [facility] ENDOSCOPY;  Service: Endoscopy;  Laterality: N/A;   MASTECTOMY  08/2012   right   TONSILLECTOMY AND ADENOIDECTOMY  1979   VESICOVAGINAL FISTULA CLOSURE W/ TAH  1983   Family History  Problem Relation Age of Onset   Diabetes Father    Stroke Father    Colon polyps Father    Stroke Mother    Parkinson's disease Mother    Social History   Socioeconomic History   Marital status: Married    Spouse name: Not on file   Martinez of children: 3   Years of education: Not on file   Highest education level: Not on file  Occupational History   Not on file  Tobacco Use   Smoking status: Never    Passive exposure: Never   Smokeless tobacco: Never   Tobacco comments:    tobacco use - no  Vaping Use   Vaping Use:  Never used  Substance and Sexual Activity   Alcohol use: No    Alcohol/week: 0.0 standard drinks of alcohol   Drug use: No   Sexual activity: Never  Other Topics Concern   Not on file  Social History Narrative   Not on file   Social Determinants of Health   Financial Resource Strain: Low Risk  (07/10/2019)   Overall Financial Resource Strain (CARDIA)    Difficulty of Paying Living Expenses: Not hard at all  Food Insecurity: No Food Insecurity (07/10/2019)   Hunger Vital Sign    Worried About Running Out of Food in the Last Year: Never true    Ran Out of Food in the Last Year: Never true  Transportation Needs: No Transportation Needs (07/12/2020)   PRAPARE - Administrator, Civil Service (Medical): No    Lack of Transportation (Non-Medical): No  Physical  Activity: Not on file  Stress: No Stress Concern Present (07/12/2020)   [employer] of Occupational Health - Occupational Stress Questionnaire    Feeling of Stress : Not at all  Social Connections: Socially Integrated (07/12/2020)   Social Connection and Isolation Panel [NHANES]    Frequency of Communication with Friends and Family: Three times a week    Frequency of Social Gatherings with Friends and Family: More than three times a week    Attends Religious Services: More than 4 times per year    Active Member of Clubs or Organizations: Yes    Attends Banker Meetings: 1 to 4 times per year    Marital Status: Married     Review of Systems  Constitutional:  Negative for appetite change and unexpected weight change.  HENT:  Negative for congestion and sinus pressure.   Respiratory:  Negative for cough and chest tightness.        Breathing stable.   Cardiovascular:  Negative for chest pain and palpitations.  Gastrointestinal:  Negative for abdominal pain, diarrhea, nausea and vomiting.  Genitourinary:  Negative for difficulty urinating and dysuria.  Musculoskeletal:  Negative for joint swelling and myalgias.  Skin:  Negative for color change and rash.  Neurological:  Positive for tremors. Negative for dizziness and headaches.  Psychiatric/Behavioral:  Negative for agitation and dysphoric mood.        Objective:     BP 130/80   Pulse 82   Temp 98 F (36.7 C)   Resp 16   Ht 5\' 4"  (1.626 m)   Wt 182 lb 9.6 oz (82.8 kg)   LMP 12/18/1981   SpO2 98%   BMI 31.34 kg/m  Wt Readings from Last 3 Encounters:  04/12/23 181 lb (82.1 kg)  04/09/23 182 lb 9.6 oz (82.8 kg)  03/15/23 183 lb (83 kg)    Physical Exam Vitals reviewed.  Constitutional:      General: She is not in acute distress.    Appearance: Normal appearance.  HENT:     Head: Normocephalic and atraumatic.     Right Ear: External ear normal.     Left Ear: External ear normal.  Eyes:     General:  No scleral icterus.       Right eye: No discharge.        Left eye: No discharge.     Conjunctiva/sclera: Conjunctivae normal.  Neck:     Thyroid: No thyromegaly.  Cardiovascular:     Rate and Rhythm: Normal rate and regular rhythm.  Pulmonary:     Effort: No respiratory distress.  Breath sounds: Normal breath sounds. No wheezing.  Abdominal:     General: Bowel sounds are normal.     Palpations: Abdomen is soft.     Tenderness: There is no abdominal tenderness.  Musculoskeletal:        General: No swelling or tenderness.     Cervical back: Neck supple. No tenderness.  Lymphadenopathy:     Cervical: No cervical adenopathy.  Skin:    Findings: No erythema or rash.  Neurological:     Mental Status: She is alert.  Psychiatric:        Mood and Affect: Mood normal.        Behavior: Behavior normal.      Outpatient Encounter Medications as of 04/09/2023  Medication Sig   hydrocortisone (ANUSOL-HC) 25 MG suppository Place 1 suppository (25 mg total) rectally 2 (two) times daily.   metoprolol succinate (TOPROL-XL) 25 MG 24 hr tablet Take 1 tablet (25 mg total) by mouth 2 (two) times daily.   amLODipine (NORVASC) 2.5 MG tablet Take 1 tablet (2.5 mg total) by mouth daily.   aspirin 81 MG EC tablet Take 81 mg by mouth daily as needed.   calcium carbonate (OSCAL) 1500 (600 Ca) MG TABS tablet Take 1 tablet by mouth daily.   citalopram (CELEXA) 20 MG tablet TAKE 1 TABLET BY MOUTH ONCE DAILY.   cyclobenzaprine (FLEXERIL) 10 MG tablet TAKE ONE TABLET BY MOUTH AT BEDTIME.   donepezil (ARICEPT) 10 MG tablet TAKE 1 TABLET BY MOUTH ONCE NIGHTLY.   erythromycin ophthalmic ointment Apply to eye.   gabapentin (NEURONTIN) 300 MG capsule Take 1 capsule (300 mg total) by mouth at bedtime.   GLUCOSAMINE SULFATE PO Take by mouth daily.   Glucosamine-Chondroitin 500-400 MG CAPS Take 2 capsules by mouth daily.   HYDROcodone-acetaminophen (NORCO/VICODIN) 5-325 MG tablet Take 1 tablet by mouth 3  (three) times daily. Must last 30 days.   [START ON 04/29/2023] HYDROcodone-acetaminophen (NORCO/VICODIN) 5-325 MG tablet Take 1 tablet by mouth 3 (three) times daily. Must last 30 days.   [START ON 05/29/2023] HYDROcodone-acetaminophen (NORCO/VICODIN) 5-325 MG tablet Take 1 tablet by mouth 3 (three) times daily. Must last 30 days.   letrozole (FEMARA) 2.5 MG tablet Take 2.5 mg by mouth daily.   losartan (COZAAR) 50 MG tablet TAKE (2) TABLETS BY MOUTH ONCE DAILY.   lubiprostone (AMITIZA) 24 MCG capsule TAKE 1 CAPSULE BY MOUTH ONCE DAILY WITH BREAKFAST.   magnesium oxide (MAG-OX) 400 (240 Mg) MG tablet TAKE 1 TABLET BY MOUTH ONCE DAILY.   melatonin 1 MG TABS tablet Take 3 mg by mouth at bedtime.   mirabegron ER (MYRBETRIQ) 50 MG TB24 tablet Take 1 tablet (50 mg total) by mouth daily.   Multiple Vitamin (MULTIVITAMIN) tablet Take 1 tablet by mouth daily.   naloxone (NARCAN) nasal spray 4 mg/0.1 mL Place 1 spray into the nose as needed for up to 365 doses (for opioid-induced respiratory depresssion). In case of emergency (overdose), spray once into each nostril. If no response within 3 minutes, repeat application and call 911.   nitrofurantoin, macrocrystal-monohydrate, (MACROBID) 100 MG capsule Take 1 capsule (100 mg total) by mouth daily.   Omega-3 1000 MG CAPS Take by mouth.   pantoprazole (PROTONIX) 40 MG tablet Take 1 tablet (40 mg total) by mouth daily.   rosuvastatin (CRESTOR) 20 MG tablet TAKE 1 TABLET BY MOUTH ONCE DAILY.   No facility-administered encounter medications on file as of 04/09/2023.     Lab Results  Component Value Date  WBC 4.6 04/09/2023   HGB 10.0 (L) 04/09/2023   HCT 30.1 (L) 04/09/2023   PLT 237.0 04/09/2023   GLUCOSE 86 04/09/2023   CHOL 123 01/31/2023   TRIG 112.0 01/31/2023   HDL 56.20 01/31/2023   LDLDIRECT 73.0 07/09/2018   LDLCALC 44 01/31/2023   ALT 18 04/09/2023   AST 26 04/09/2023   NA 137 04/09/2023   K 4.0 04/09/2023   CL 101 04/09/2023    CREATININE 1.05 04/09/2023   BUN 17 04/09/2023   CO2 28 04/09/2023   TSH 1.42 09/26/2022   HGBA1C 6.6 (H) 01/31/2023    DG PAIN CLINIC C-ARM 1-60 MIN NO REPORT  Result Date: 03/01/2023 Fluoro was used, but no Radiologist interpretation will be provided. Please refer to "NOTES" tab for provider progress note.      Assessment & Plan:  Anemia, unspecified type Assessment & Plan: Sees hematology - Dr Orlie Dakin.  Following cbc. Recheck today.  Recommended retacrit if hgb remains below 10.    Orders: -     CBC with Differential/Platelet -     Vitamin B12 -     IBC + Ferritin  Essential hypertension Assessment & Plan: Blood pressure as outlined. Had previously been on Hctz, metoprolol, amlodipine and losartan. Per note, hctz and metoprolol stopped while in hospital.  She has been monitoring her blood pressure.  Elevated. Continue amlodipine and losartan.  Will add metoprolol.  Follow pressures. Adjust medication as needed.  Follow.   Orders: -     Basic metabolic panel  Hypercholesterolemia Assessment & Plan: On crestor.  Follow lipid panel and liver function tests.    Orders: -     Hepatic function panel  Hyperglycemia Assessment & Plan: Low carb diet and exercise.  Follow met b and a1c.    Aortic atherosclerosis (HCC) Assessment & Plan: Continue crestor.    Stage 3a chronic kidney disease (HCC) Assessment & Plan: Avoid antiinflammatories.  Stay hdrated. Recheck metabolic panel today.    Depression, major, single episode, mild (HCC) Assessment & Plan: Continue citalopram.  Overall appears to be handling things well.  Follow.     Gastroesophageal reflux disease, unspecified whether esophagitis present Assessment & Plan: No upper symptoms reported. On protonix.    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  Patient completed 5 years of letrozole in May 2019.  No further interventions are needed.  Patient has had bilateral mastectomies, therefore no further mammograms are necessary.    Parkinson's disease, unspecified whether dyskinesia present, unspecified whether manifestations fluctuate Assessment & Plan: Has seen neurology previously.  It appears that she was previously on sinemet, but this was stopped years ago given question of diagnosis of parkinsons.  She has been maintained on gabapentin - to help with tremors, pain, etc.  Has seen Dr Malvin Johns - neurology.  She had f/u with neurology.  Neurology does not feel symptoms are c/w parkinsons.  On gabapentin.  Tremor has improved.  Still an issue.  Continue gabapentin.  Restart metoprolol to help with blood pressure and will see if helps with tremor.  Follow.    Other orders -     Metoprolol Succinate ER; Take 1 tablet (25 mg total) by mouth 2 (two) times daily.  Dispense: 180 tablet; Refill: 1 -     Hydrocortisone Acetate; Place 1 suppository (25 mg total) rectally 2 (two) times daily.  Dispense: 12 suppository; Refill: 0     Dale Laurens, MD

## 2023-04-09 NOTE — Patient Instructions (Signed)
Start metoprolol twice a day.

## 2023-04-10 ENCOUNTER — Telehealth: Payer: Self-pay

## 2023-04-10 NOTE — Telephone Encounter (Signed)
-----   Message from Dale Everton, MD sent at 04/10/2023  4:01 AM EDT ----- Please notify Wanda Martinez that her hgb is stable from last check.  Her B12 level is ok and her iron studies are ok.  Sodium and potassium are wnl.  Kidney function has improved.  Ok to take gabapentin as she is doing.  Hold on increasing dose.  Liver function tests are wnl.  Keep f/u appt with Dr Orlie Dakin for continued f/u of her hgb.

## 2023-04-11 NOTE — Telephone Encounter (Signed)
Noted  

## 2023-04-11 NOTE — Progress Notes (Unsigned)
PROVIDER NOTE: Interpretation of information contained herein should be left to medically-trained personnel. Specific patient instructions are provided elsewhere under "Patient Instructions" section of medical record. This document was created in part using STT-dictation technology, any transcriptional errors that may result from this process are unintentional.  Patient: Wanda Martinez Type: Established DOB: Jul 30, 1935 MRN: [id number] PCP: Dale [place], MD  Service: Procedure DOS: 04/12/2023 Setting: Ambulatory Location: Ambulatory outpatient facility Delivery: Face-to-face Provider: Oswaldo Done, MD Specialty: Interventional Pain Management Specialty designation: 09 Location: Outpatient facility Ref. Prov.: Dale [place], MD       Interventional Therapy   Procedure: Sacroiliac Joint Steroid Injection #2    Laterality: Left     Level: PSIS (Posterior Superior Iliac Spine)  Imaging: Fluoroscopic guidance Anesthesia: Local anesthesia (1-2% Lidocaine) Anxiolysis: None                 Sedation: No Sedation                       DOS: 04/12/2023  Performed by: Oswaldo Done, MD  Purpose: Diagnostic/Therapeutic Indications: Sacroiliac joint pain in the lower back and hip area severe enough to impact quality of life or function. Rationale (medical necessity): procedure needed and proper for the diagnosis and/or treatment of Wanda Martinez's medical symptoms and needs. 1. Chronic sacroiliac joint pain (Left)   2. Other specified dorsopathies, sacral and sacrococcygeal region   3. Chronic low back pain (1ry area of Pain) (Bilateral) (R>L) w/o sciatica    NAS-11 Pain score:   Pre-procedure: 6 /10   Post-procedure: 0-No pain/10   Note: We initially had the patient scheduled to come in for a right-sided sacroiliac joint injection however, upon arrival today she indicates that although she is having a little bit of discomfort on the right side toward side that is bothering her  the most is the left side and she would like to have that 1 injected instead.    Target: Interarticular sacroiliac joint. Location: Medial to the postero-medial edge of iliac spine. Region: Lumbosacral-sacrococcygeal. Approach:    Mid joint    postero-medial percutaneous approach. Type of procedure: Percutaneous joint injection.  Position / Prep / Materials:  Position: Prone  Prep solution: DuraPrep (Iodine Povacrylex [0.7% available iodine] and Isopropyl Alcohol, 74% w/w) Prep Area: Entire posterior lumbosacral area  Materials:  Tray: Block Needle(s):  Type: Spinal  Gauge (G): 22  Length: 3.5-in Qty: 1  Pre-op H&P Assessment:  Wanda Martinez is a 88 y.o. (year old), female patient, seen today for interventional treatment. She  has a past surgical history that includes Tonsillectomy and adenoidectomy (1979); Vesicovaginal fistula closure w/ TAH (1983); Breast surgery (1986); Breast enhancement surgery (1987); Breast implant removal; Breast implant removal (Right, 08/29/2012); Mastectomy (08/2012); Abdominal hysterectomy; Colonoscopy with propofol (N/A, 09/13/2016); Breast biopsy (2013); and Blepharoplasty. Wanda Martinez has a current medication list which includes the following prescription(s): amlodipine, aspirin ec, calcium carbonate, citalopram, cyclobenzaprine, donepezil, erythromycin, gabapentin, glucosamine sulfate, glucosamine-chondroitin, hydrocodone-acetaminophen, [START ON 04/29/2023] hydrocodone-acetaminophen, [START ON 05/29/2023] hydrocodone-acetaminophen, hydrocortisone, letrozole, losartan, lubiprostone, magnesium oxide, melatonin, metoprolol succinate, mirabegron er, multivitamin, naloxone, nitrofurantoin (macrocrystal-monohydrate), omega-3, pantoprazole, and rosuvastatin. Her primarily concern today is the Back Pain (Left, lower)  Initial Vital Signs:  Pulse/HCG Rate: (!) 50ECG Heart Rate: (!) 54 Temp: (!) 97 F (36.1 C) Resp: 14 BP: (!) 159/71 SpO2: 100 %  BMI: Estimated body  mass index is 31.07 kg/m as calculated from the following:   Height as of this encounter: 5'  4" (1.626 m).   Weight as of this encounter: 181 lb (82.1 kg).  Risk Assessment: Allergies: Reviewed. She is allergic to sulfa antibiotics and vesicare [solifenacin].  Allergy Precautions: None required Coagulopathies: Reviewed. None identified.  Blood-thinner therapy: None at this time Active Infection(s): Reviewed. None identified. Wanda Martinez is afebrile  Site Confirmation: Wanda Martinez was asked to confirm the procedure and laterality before marking the site Procedure checklist: Completed Consent: Before the procedure and under the influence of no sedative(s), amnesic(s), or anxiolytics, the patient was informed of the treatment options, risks and possible complications. To fulfill our ethical and legal obligations, as recommended by the American Medical Association's Code of Ethics, I have informed the patient of my clinical impression; the nature and purpose of the treatment or procedure; the risks, benefits, and possible complications of the intervention; the alternatives, including doing nothing; the risk(s) and benefit(s) of the alternative treatment(s) or procedure(s); and the risk(s) and benefit(s) of doing nothing. The patient was provided information about the general risks and possible complications associated with the procedure. These may include, but are not limited to: failure to achieve desired goals, infection, bleeding, organ or nerve damage, allergic reactions, paralysis, and death. In addition, the patient was informed of those risks and complications associated to the procedure, such as failure to decrease pain; infection; bleeding; organ or nerve damage with subsequent damage to sensory, motor, and/or autonomic systems, resulting in permanent pain, numbness, and/or weakness of one or several areas of the body; allergic reactions; (i.e.: anaphylactic reaction); and/or death. Furthermore,  the patient was informed of those risks and complications associated with the medications. These include, but are not limited to: allergic reactions (i.e.: anaphylactic or anaphylactoid reaction(s)); adrenal axis suppression; blood sugar elevation that in diabetics may result in ketoacidosis or comma; water retention that in patients with history of congestive heart failure may result in shortness of breath, pulmonary edema, and decompensation with resultant heart failure; weight gain; swelling or edema; medication-induced neural toxicity; particulate matter embolism and blood vessel occlusion with resultant organ, and/or nervous system infarction; and/or aseptic necrosis of one or more joints. Finally, the patient was informed that Medicine is not an exact science; therefore, there is also the possibility of unforeseen or unpredictable risks and/or possible complications that may result in a catastrophic outcome. The patient indicated having understood very clearly. We have given the patient no guarantees and we have made no promises. Enough time was given to the patient to ask questions, all of which were answered to the patient's satisfaction. Ms. Scroggins has indicated that she wanted to continue with the procedure. Attestation: I, the ordering provider, attest that I have discussed with the patient the benefits, risks, side-effects, alternatives, likelihood of achieving goals, and potential problems during recovery for the procedure that I have provided informed consent. Date  Time: 04/12/2023 11:02 AM  Pre-Procedure Preparation:  Monitoring: As per clinic protocol. Respiration, ETCO2, SpO2, BP, heart rate and rhythm monitor placed and checked for adequate function Safety Precautions: Patient was assessed for positional comfort and pressure points before starting the procedure. Time-out: I initiated and conducted the "Time-out" before starting the procedure, as per protocol. The patient was asked to  participate by confirming the accuracy of the "Time Out" information. Verification of the correct person, site, and procedure were performed and confirmed by me, the nursing staff, and the patient. "Time-out" conducted as per Joint Commission's Universal Protocol (UP.01.01.01). Time: 1136 Start Time: 1136 hrs.  Description/Narrative of Procedure:  Start Time: 1136 hrs.  Rationale (medical necessity): procedure needed and proper for the diagnosis and/or treatment of the patient's medical symptoms and needs. Procedural Technique Safety Precautions: Aspiration looking for blood return was conducted prior to all injections. At no point did we inject any substances, as a needle was being advanced. No attempts were made at seeking any paresthesias. Safe injection practices and needle disposal techniques used. Medications properly checked for expiration dates. SDV (single dose vial) medications used. Description of the Procedure: Protocol guidelines were followed. The patient was assisted into a comfortable position. The target area was identified and the area prepped in the usual manner. Skin & deeper tissues infiltrated with local anesthetic. Appropriate amount of time allowed to pass for local anesthetics to take effect. The procedure needles were then advanced to the target area. Proper needle placement secured. Negative aspiration confirmed. Solution injected in intermittent fashion, asking for systemic symptoms every 0.5cc of injectate. The needles were then removed and the area cleansed, making sure to leave some of the prepping solution back to take advantage of its long term bactericidal properties.  Technical description of procedure:  Fluoroscopy using a posterior anterior 45 degree angle from the midline aiming at the anterolateral aspect of the patient was used to find a direct path into the sacroiliac joint, the superior medial to posterior superior iliac spine.  The skin was marked where  the desired target and the skin infiltrated with local anesthetics.  The procedure needle was then advanced until the joint was entered.  Once inside of the joint, we then proceeded to inject the desired solution.  Vitals:   04/12/23 1100 04/12/23 1136 04/12/23 1139 04/12/23 1143  BP: (!) 159/71 (!) 180/84 (!) 186/79 (!) 187/76  Pulse: (!) 50   (!) 53  Resp: 14 11 12 14   Temp: (!) 97 F (36.1 C)     TempSrc: Temporal     SpO2: 100% 100% 100% 100%  Weight: 181 lb (82.1 kg)     Height: 5\' 4"  (1.626 m)      End Time: 1141 hrs.  Imaging Guidance (Non-Spinal):          Type of Imaging Technique: Fluoroscopy Guidance (Non-Spinal) Indication(s): Assistance in needle guidance and placement for procedures requiring needle placement in or near specific anatomical locations not easily accessible without such assistance. Exposure Time: Please see nurses notes. Contrast: None used. Fluoroscopic Guidance: I was personally present during the use of fluoroscopy. "Tunnel Vision Technique" used to obtain the best possible view of the target area. Parallax error corrected before commencing the procedure. "Direction-depth-direction" technique used to introduce the needle under continuous pulsed fluoroscopy. Once target was reached, antero-posterior, oblique, and lateral fluoroscopic projection used confirm needle placement in all planes. Images permanently stored in EMR. Interpretation: No contrast injected. I personally interpreted the imaging intraoperatively. Adequate needle placement confirmed in multiple planes. Permanent images saved into the patient's record.  Post-operative Assessment:  Post-procedure Vital Signs:  Pulse/HCG Rate: (!) 53(!) 54 Temp: (!) 97 F (36.1 C) Resp: 14 BP: (!) 187/76 SpO2: 100 %  EBL: None  Complications: No immediate post-treatment complications observed by team, or reported by patient.  Note: The patient tolerated the entire procedure well. A repeat set of vitals  were taken after the procedure and the patient was kept under observation following institutional policy, for this type of procedure. Post-procedural neurological assessment was performed, showing return to baseline, prior to discharge. The patient was provided with post-procedure discharge instructions, including a  section on how to identify potential problems. Should any problems arise concerning this procedure, the patient was given instructions to immediately contact us, at any time, without hesitation. In any case, we plan to contact the patient by telephone for a follow-up status report regarding this interventional procedure.  Comments:  No additional relevant information.  Plan of Care (POC)  Orders:  Orders Placed This Encounter  Procedures   SACROILIAC JOINT INJECTION    Scheduling Instructions:     Side: Left     Sedation: Patient's choice.     Timeframe: Today    Order Specific Question:   Where will this procedure be performed?    Answer:   [facility]   DG PAIN [facility] C-ARM 1-60 MIN NO REPORT    Intraoperative interpretation by procedural physician at [facility].    Standing Status:   Standing    Number of Occurrences:   1    Order Specific Question:   Reason for exam:    Answer:   Assistance in needle guidance and placement for procedures requiring needle placement in or near specific anatomical locations not easily accessible without such assistance.   Informed Consent Details: Physician/Practitioner Attestation; Transcribe to consent form and obtain patient signature    Nursing Order: Transcribe to consent form and obtain patient signature. Note: Always confirm laterality of pain with [name], before procedure.    Order Specific Question:   Physician/Practitioner attestation of informed consent for procedure/surgical case    Answer:   I, the physician/practitioner, attest that I have discussed with the patient the benefits, risks, side effects,  alternatives, likelihood of achieving goals and potential problems during recovery for the procedure that I have provided informed consent.    Order Specific Question:   Procedure    Answer:   Sacroiliac Joint Block    Order Specific Question:   Physician/Practitioner performing the procedure    Answer:   Partick Musselman A. Laban Emperor, MD    Order Specific Question:   Indication/Reason    Answer:   Chronic Low Back and Hip Pain secondary to Sacroiliac Joint Pain (Arthralgia/Arthropathy)   Provide equipment / supplies at bedside    Procedure tray: "Block Tray" (Disposable  single use) Skin infiltration needle: Regular 1.5-in, 25-G, (x1) Block Needle type: Spinal Amount/quantity: 5 Size: Regular (3.5-inch) Gauge: 22G    Standing Status:   Standing    Number of Occurrences:   1    Order Specific Question:   Specify    Answer:   Block Tray   Chronic Opioid Analgesic:  Hydrocodone/APAP 5/325, 1 tab PO q 8 hrs (15 mg/day of hydrocodone) MME/day: 15 mg/day.   Medications ordered for procedure: Meds ordered this encounter  Medications   lidocaine (XYLOCAINE) 2 % (with pres) injection 400 mg   pentafluoroprop-tetrafluoroeth (GEBAUERS) aerosol   DISCONTD: lactated ringers infusion   DISCONTD: midazolam (VERSED) injection 0.5-2 mg    Make sure Flumazenil is available in the pyxis when using this medication. If oversedation occurs, administer 0.2 mg IV over 15 sec. If after 45 sec no response, administer 0.2 mg again over 1 min; may repeat at 1 min intervals; not to exceed 4 doses (1 mg)   ropivacaine (PF) 2 mg/mL (0.2%) (NAROPIN) injection 4 mL   methylPREDNISolone acetate (DEPO-MEDROL) injection 80 mg   Medications administered: We administered lidocaine, pentafluoroprop-tetrafluoroeth, ropivacaine (PF) 2 mg/mL (0.2%), and methylPREDNISolone acetate.  See the medical record for exact dosing, route, and time of administration.  Follow-up plan:  Return in about 2 weeks (around 04/26/2023) for  Proc-day (T,Th), (Face2F), (PPE).       Interventional Therapies  Risk Factors  Considerations:      Planned  Pending:   Therapeutic left SI joint injection #2 (04/12/2023)    Under consideration:   Therapeutic right SI joint injection #4  Diagnostic right genicular NB  Diagnostic bilateral cervical facet MBB  Therapeutic right L3 and L4 TFESI #1    Completed:   Therapeutic right superior/lateral trapezius muscle TPI x1 (04/19/2021)  Therapeutic right thoracic back TPI x2 (10/17/2018) (DKFUA)  Therapeutic left thoracic back TPI x1 (10/17/2018) (DKFUA)  Therapeutic right quadratus lumborum and erector spinae muscle TPI x1 (02/12/2018)  Therapeutic bilateral lumbar facet MBB (L2-S1) (FCT: L3-4,L4-5,L5-S1) x5 (08/09/2021) (100/100/50)  Therapeutic bilateral lumbar facet MBB (L4-S1) (FCT: L5-S1) x1 (08/29/2022) (100/100/100 x3 days/R:0L:100)  Therapeutic right lumbar facet MBB (L2-S1) x4 (12/21/2020) (DKFUA)  Therapeutic right lumbar facet RFA (L2-S1) x3 (09/28/2022) (100/100/75/75)  Therapeutic right lumbar facet RFA (L3-S1) x1 (03/01/2023) (100/100/70/70)  Therapeutic left lumbar facet RFA x2 (11/30/2022) (100/60/90/>75)  Diagnostic right SI joint Blk x3 (12/21/2020)  (DKFUA)  Diagnostic left SI joint Blk x1 (08/09/2017) (100/90/20/<25)  Therapeutic right SI joint RFA x4 (03/01/2023) (100/100/70/70)  Therapeutic midline L2-3 LESI x1 (10/04/2017) (100/100/85/>50)  Therapeutic right L3-4 LESI x2 (11/04/2020) (100/100/20/<50)  Therapeutic right L4-5 LESI x2 (03/30/2022) (100/100/75/75)  Therapeutic right L5-S1 LESI x2 (12/04/2019) (100/100/50/75)  Diagnostic/therapeutic right L3 and L4 TFESI x1 (07/27/2022) (100/100/75/50)  Therapeutic right L5 TFESI x4 (02/14/2022) (100/100/70/75)  Therapeutic right S1 TFESI x1 (11/04/2020) (100/100/20/<50)  Diagnostic bilateral superficial trochanteric bursa injec. x1 (04/30/2018) (DKFUA)  Diagnostic right IA hip injection x1 (02/13/2019) (DKFUA)   Diagnostic/therapeutic right IA steroid knee inj. x4 (01/10/2023) (100/100/100/80)  Therapeutic right (Zilretta) knee injection x1 (10/06/2021)  (DKFUA)  Therapeutic right (Monovisc) knee injection x1 (06/07/2021) (100/100/100/75)    Therapeutic  Palliative (PRN) options:   Therapeutic/palliative lumbar facet RFA  Therapeutic/palliative lumbar facet MBB  Therapeutic left L2 TFESI (this level is responsible for her low back pain and left lower extremity pain.)   Pharmacotherapy  Nonopioids transfer 12/21/2020: Gabapentin       Recent Visits Date Type Provider Dept  03/15/23 Office Visit [name], MD [facility]  03/01/23 Procedure visit [name], MD [facility]  02/19/23 Office Visit [name], MD [facility]  Showing recent visits within past 90 days and meeting all other requirements Today's Visits Date Type Provider Dept  04/12/23 Procedure visit [name], MD [facility]  Showing today's visits and meeting all other requirements Future Appointments Date Type Provider Dept  05/22/23 Appointment [name], MD [facility]  06/25/23 Appointment [name], MD [facility]  Showing future appointments within next 90 days and meeting all other requirements  Disposition: Discharge home  Discharge (Date  Time): 04/12/2023; 1146 hrs.   Primary Care Physician: Dale Mammoth, MD Location: [facility] Note by: Oswaldo Done, MD (TTS technology used. I apologize for any typographical errors that were not detected and corrected.) Date: 04/12/2023; Time: 12:18 PM  Disclaimer:  Medicine is not an Visual merchandiser. The only guarantee in medicine is that nothing is guaranteed. It is important to note that the decision to proceed with this intervention was based on the information collected from the patient. The Data and conclusions were drawn from  the patient's questionnaire, the interview, and the physical examination. Because the information was provided in large part  by the patient, it cannot be guaranteed that it has not been purposely or unconsciously manipulated. Every effort has been made to obtain as much relevant data as possible for this evaluation. It is important to note that the conclusions that lead to this procedure are derived in large part from the available data. Always take into account that the treatment will also be dependent on availability of resources and existing treatment guidelines, considered by other Pain Management Practitioners as being common knowledge and practice, at the time of the intervention. For Medico-Legal purposes, it is also important to point out that variation in procedural techniques and pharmacological choices are the acceptable norm. The indications, contraindications, technique, and results of the above procedure should only be interpreted and judged by a Board-Certified Interventional Pain Specialist with extensive familiarity and expertise in the same exact procedure and technique.

## 2023-04-11 NOTE — Telephone Encounter (Signed)
Pt called back and I read the message to her and she verbalized understanding 

## 2023-04-12 ENCOUNTER — Ambulatory Visit
Admission: RE | Admit: 2023-04-12 | Discharge: 2023-04-12 | Disposition: A | Discharge status: Home / Self Care | Payer: Medicare HMO | Source: Ambulatory Visit | Attending: Pain Medicine | Admitting: Pain Medicine

## 2023-04-12 ENCOUNTER — Ambulatory Visit: Payer: Medicare HMO | Attending: Pain Medicine | Admitting: Pain Medicine

## 2023-04-12 ENCOUNTER — Encounter: Payer: Self-pay | Admitting: Pain Medicine

## 2023-04-12 VITALS — BP 187/76 | HR 53 | Temp 97.0°F | Resp 14 | Ht 64.0 in | Wt 181.0 lb

## 2023-04-12 DIAGNOSIS — G8929 Other chronic pain: Secondary | ICD-10-CM | POA: Insufficient documentation

## 2023-04-12 DIAGNOSIS — M545 Low back pain, unspecified: Secondary | ICD-10-CM | POA: Diagnosis not present

## 2023-04-12 DIAGNOSIS — M5388 Other specified dorsopathies, sacral and sacrococcygeal region: Secondary | ICD-10-CM | POA: Insufficient documentation

## 2023-04-12 DIAGNOSIS — M533 Sacrococcygeal disorders, not elsewhere classified: Secondary | ICD-10-CM | POA: Diagnosis not present

## 2023-04-12 MED ORDER — LIDOCAINE HCL 2 % IJ SOLN
20.0000 mL | Freq: Once | INTRAMUSCULAR | Status: AC
  Administered 2023-04-12: 200 mg
  Filled 2023-04-12: qty 20

## 2023-04-12 MED ORDER — ROPIVACAINE HCL 2 MG/ML IJ SOLN
4.0000 mL | Freq: Once | INTRAMUSCULAR | Status: AC
  Administered 2023-04-12: 4 mL via INTRA_ARTICULAR
  Filled 2023-04-12: qty 20

## 2023-04-12 MED ORDER — MIDAZOLAM HCL 2 MG/2ML IJ SOLN: 0.5000 mg | Freq: Once | INTRAMUSCULAR | Status: DC

## 2023-04-12 MED ORDER — PENTAFLUOROPROP-TETRAFLUOROETH EX AERO
INHALATION_SPRAY | Freq: Once | CUTANEOUS | Status: AC
  Administered 2023-04-12: 1 via TOPICAL
  Filled 2023-04-12: qty 116

## 2023-04-12 MED ORDER — METHYLPREDNISOLONE ACETATE 80 MG/ML IJ SUSP
80.0000 mg | Freq: Once | INTRAMUSCULAR | Status: AC
  Administered 2023-04-12: 80 mg via INTRA_ARTICULAR
  Filled 2023-04-12: qty 1

## 2023-04-12 MED ORDER — LACTATED RINGERS IV SOLN: Freq: Once | INTRAVENOUS | Status: DC

## 2023-04-12 NOTE — Patient Instructions (Signed)

## 2023-04-13 ENCOUNTER — Telehealth: Payer: Self-pay

## 2023-04-13 NOTE — Telephone Encounter (Signed)
Post procedure follow up.  Patient states she is doing good.  

## 2023-04-16 ENCOUNTER — Encounter: Payer: Self-pay | Admitting: Internal Medicine

## 2023-04-16 ENCOUNTER — Inpatient Hospital Stay: Payer: Medicare HMO | Attending: Oncology

## 2023-04-16 ENCOUNTER — Inpatient Hospital Stay: Payer: Medicare HMO

## 2023-04-16 DIAGNOSIS — D649 Anemia, unspecified: Secondary | ICD-10-CM

## 2023-04-16 DIAGNOSIS — N186 End stage renal disease: Secondary | ICD-10-CM | POA: Insufficient documentation

## 2023-04-16 DIAGNOSIS — D631 Anemia in chronic kidney disease: Secondary | ICD-10-CM | POA: Insufficient documentation

## 2023-04-16 DIAGNOSIS — M810 Age-related osteoporosis without current pathological fracture: Secondary | ICD-10-CM | POA: Insufficient documentation

## 2023-04-16 LAB — CBC WITH DIFFERENTIAL/PLATELET
Abs Immature Granulocytes: 0.02 10*3/uL (ref 0.00–0.07)
Basophils Absolute: 0 10*3/uL (ref 0.0–0.1)
Basophils Relative: 1 %
Eosinophils Absolute: 0 10*3/uL (ref 0.0–0.5)
Eosinophils Relative: 1 %
HCT: 32.9 % — ABNORMAL LOW (ref 36.0–46.0)
Hemoglobin: 10.5 g/dL — ABNORMAL LOW (ref 12.0–15.0)
Immature Granulocytes: 0 %
Lymphocytes Relative: 30 %
Lymphs Abs: 1.9 10*3/uL (ref 0.7–4.0)
MCH: 30.6 pg (ref 26.0–34.0)
MCHC: 31.9 g/dL (ref 30.0–36.0)
MCV: 95.9 fL (ref 80.0–100.0)
Monocytes Absolute: 0.6 10*3/uL (ref 0.1–1.0)
Monocytes Relative: 9 %
Neutro Abs: 3.7 10*3/uL (ref 1.7–7.7)
Neutrophils Relative %: 59 %
Platelets: 237 10*3/uL (ref 150–400)
RBC: 3.43 MIL/uL — ABNORMAL LOW (ref 3.87–5.11)
RDW: 15.1 % (ref 11.5–15.5)
WBC: 6.3 10*3/uL (ref 4.0–10.5)
nRBC: 0 % (ref 0.0–0.2)

## 2023-04-16 NOTE — Assessment & Plan Note (Signed)
On crestor.  Follow lipid panel and liver function tests.   

## 2023-04-16 NOTE — Assessment & Plan Note (Signed)
Avoid antiinflammatories.  Stay hdrated. Recheck metabolic panel today.

## 2023-04-16 NOTE — Assessment & Plan Note (Signed)
Sees hematology - Dr Finnegan.  Following cbc. Recheck today.  Recommended retacrit if hgb remains below 10.   

## 2023-04-16 NOTE — Assessment & Plan Note (Signed)
Has seen neurology previously.  It appears that she was previously on sinemet, but this was stopped years ago given question of diagnosis of parkinsons.  She has been maintained on gabapentin - to help with tremors, pain, etc.  Has seen Dr Malvin Johns - neurology.  She had f/u with neurology.  Neurology does not feel symptoms are c/w parkinsons.  On gabapentin.  Tremor has improved.  Still an issue.  Continue gabapentin.  Restart metoprolol to help with blood pressure and will see if helps with tremor.  Follow.

## 2023-04-16 NOTE — Assessment & Plan Note (Signed)
Blood pressure as outlined. Had previously been on Hctz, metoprolol, amlodipine and losartan. Per note, hctz and metoprolol stopped while in hospital.  She has been monitoring her blood pressure.  Elevated. Continue amlodipine and losartan.  Will add metoprolol.  Follow pressures. Adjust medication as needed.  Follow.

## 2023-04-16 NOTE — Assessment & Plan Note (Signed)
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  Patient completed 5 years of letrozole in May 2019.  No further interventions are needed.  Patient has had bilateral mastectomies, therefore no further mammograms are necessary.  ?

## 2023-04-16 NOTE — Assessment & Plan Note (Signed)
Continue citalopram.  Overall appears to be handling things well.  Follow.   

## 2023-04-16 NOTE — Assessment & Plan Note (Signed)
Low carb diet and exercise.  Follow met b and a1c.   

## 2023-04-16 NOTE — Assessment & Plan Note (Signed)
Continue crestor 

## 2023-04-16 NOTE — Assessment & Plan Note (Signed)
No upper symptoms reported.  On protonix.   

## 2023-04-16 NOTE — Progress Notes (Signed)
Hgb is 10.5 today, will hold retacrit

## 2023-04-18 ENCOUNTER — Other Ambulatory Visit: Payer: Self-pay | Admitting: Internal Medicine

## 2023-04-23 ENCOUNTER — Telehealth: Payer: Self-pay | Admitting: Pain Medicine

## 2023-04-23 NOTE — Telephone Encounter (Signed)
Please schedule VV. Dr. Laban Emperor will not order a procedure without an appt.,

## 2023-04-23 NOTE — Telephone Encounter (Signed)
PT stated that she had spoke with Dr. Laban Emperor about getting injection done on her right knee. PT wanted to see if he will place order. Please gibe patient a call. TY

## 2023-04-24 NOTE — Progress Notes (Unsigned)
Patient: Wanda Martinez  Service Category: E/M  Provider: Oswaldo Done, MD  DOB: 1935/11/02  DOS: 04/25/2023  Location: Office  MRN: [id number]  Setting: Ambulatory outpatient  Referring Provider: Dale Troutdale, MD  Type: Established Patient  Specialty: Interventional Pain Management  PCP: Dale Stovall, MD  Location: Remote location  Delivery: TeleHealth     Virtual Encounter - Pain Management PROVIDER NOTE: Information contained herein reflects review and annotations entered in association with encounter. Interpretation of such information and data should be left to medically-trained personnel. Information provided to patient can be located elsewhere in the medical record under "Patient Instructions". Document created using STT-dictation technology, any transcriptional errors that may result from process are unintentional.    Contact & Pharmacy Preferred: [phone] Home: [phone] (home) Mobile: [phone] (mobile) E-mail: No e-mail address on record  [facility] - [place], [place] - [address] [place] [address] Phone: [phone] Fax: [phone]   Pre-screening  Wanda Martinez offered "in-person" vs "virtual" encounter. She indicated preferring virtual for this encounter.   Reason COVID-19*  Social distancing based on CDC and AMA recommendations.   I contacted Wanda Martinez on 04/25/2023 via telephone.      I clearly identified myself as Oswaldo Done, MD. I verified that I was speaking with the correct person using two identifiers (Name: Wanda Martinez, and date of birth: [birth year]).  Consent I sought verbal advanced consent from Wanda Martinez for virtual visit interactions. I informed Wanda Martinez of possible security and privacy concerns, risks, and limitations associated with providing "not-in-person" medical evaluation and management services. I also informed Wanda Martinez of the availability of  "in-person" appointments. Finally, I informed her that there would be a charge for the virtual visit and that she could be  personally, fully or partially, financially responsible for it. Wanda Martinez expressed understanding and agreed to proceed.   Historic Elements   Wanda Martinez is a 88 y.o. year old, female patient evaluated today after our last contact on 04/23/2023. Wanda Martinez  has a past medical history of Acute postoperative pain (10/25/2017), Anemia, Arm pain (07/26/2015), Arthritis, Arthritis, degenerative (03/26/2014), Back pain (11/01/2013), Bladder infection (06/2018), Breast cancer (HCC), Breast cancer (HCC), CHEST PAIN (04/29/2010), Chronic cystitis, Cystocele (02/22/2013), Cystocele, midline (08/19/2013), Degeneration of intervertebral disc of lumbosacral region (03/26/2014), DYSPNEA (04/29/2010), Enthesopathy of hip (03/26/2014), GERD (gastroesophageal reflux disease), Hiatal hernia, HTN (hypertension), Hypokalemia (06/2018), Hyponatremia (06/2018), LBP (low back pain) (03/26/2014), Neck pain (11/01/2013), Parkinson disease, Pneumonia (06/2018), Sinusitis (02/07/2015), Skin lesions (07/12/2014), and Urinary incontinence. She also  has a past surgical history that includes Tonsillectomy and adenoidectomy (1979); Vesicovaginal fistula closure w/ TAH (1983); Breast surgery (1986); Breast enhancement surgery (1987); Breast implant removal; Breast implant removal (Right, 08/29/2012); Mastectomy (08/2012); Abdominal hysterectomy; Colonoscopy with propofol (N/A, 09/13/2016); Breast biopsy (2013); and Blepharoplasty. Wanda Martinez has a current medication list which includes the following prescription(s): amlodipine, aspirin ec, calcium carbonate, citalopram, cyclobenzaprine, donepezil, erythromycin, gabapentin, glucosamine sulfate, glucosamine-chondroitin, hydrocodone-acetaminophen, [START ON 04/29/2023] hydrocodone-acetaminophen, [START ON 05/29/2023] hydrocodone-acetaminophen, hydrocortisone, letrozole, losartan,  lubiprostone, magnesium oxide, melatonin, metoprolol succinate, mirabegron er, multivitamin, naloxone, nitrofurantoin (macrocrystal-monohydrate), omega-3, pantoprazole, and rosuvastatin. She  reports that she has never smoked. She has never been exposed to tobacco smoke. She has never used smokeless tobacco. She reports that she does not drink alcohol and does not use drugs. Wanda Martinez is allergic to sulfa antibiotics and vesicare [solifenacin].  BMI: Estimated body mass index is 31.07 kg/m as  calculated from the following:   Height as of 04/12/23: 5\' 4"  (1.626 m).   Weight as of 04/12/23: 181 lb (82.1 kg). Last encounter: 03/15/2023. Last procedure: 04/12/2023.  HPI  Today, she is being contacted for worsening of previously known (established) problem.  (R) Knee  Pharmacotherapy Assessment   Opioid Analgesic: Hydrocodone/APAP 5/325, 1 tab PO q 8 hrs (15 mg/day of hydrocodone) MME/day: 15 mg/day.   Monitoring: [place] PMP: PDMP not reviewed this encounter.       Pharmacotherapy: No side-effects or adverse reactions reported. Compliance: No problems identified. Effectiveness: Clinically acceptable. Plan: Refer to "POC". UDS:  Summary  Date Value Ref Range Status  06/12/2022 Note  Final    Comment:    ==================================================================== [employer] Select 13 (MW) ==================================================================== Test                             Result       Flag       Units  Drug Present and Declared for Prescription Verification   Hydrocodone                    1766         EXPECTED   ng/mg creat   Dihydrocodeine                 303          EXPECTED   ng/mg creat   Norhydrocodone                 1597         EXPECTED   ng/mg creat    Sources of hydrocodone include scheduled prescription medications.    Dihydrocodeine and norhydrocodone are expected metabolites of    hydrocodone. Dihydrocodeine is also available as a scheduled     prescription medication.  ==================================================================== Test                      Result    Flag   Units      Ref Range   Creatinine              38               mg/dL      >=16 ==================================================================== Declared Medications:  The flagging and interpretation on this report are based on the  following declared medications.  Unexpected results may arise from  inaccuracies in the declared medications.   **Note: The testing scope of this panel includes these medications:   Hydrocodone (Norco)   **Note: The testing scope of this panel does not include the  following reported medications:   Acetaminophen (Norco)  Amlodipine  Aspirin  Calcium  Chondroitin  Citalopram (Celexa)  Cyclobenzaprine (Flexeril)  Donepezil (Aricept)  Gabapentin (Neurontin)  Glucosamine  Letrozole (Femara)  Losartan (Cozaar)  Lubiprostone (Amitiza)  Magnesium (Mag-Ox)  Melatonin  Meloxicam (Mobic)  Metoprolol  Multivitamin  Nitrofurantoin (Macrobid)  Omega-3 Fatty Acids  Omeprazole  Pantoprazole (Protonix)  Rosuvastatin (Crestor) ==================================================================== For clinical consultation, please call [phone]. ====================================================================    No results found for: "CBDTHCR", "D8THCCBX", "D9THCCBX"   Laboratory Chemistry Profile   Renal Lab Results  Component Value Date   BUN 17 04/09/2023   CREATININE 1.05 04/09/2023   BCR 20 06/11/2020   GFR 48.46 (L) 04/09/2023   GFRAA >60 07/27/2020   GFRNONAA 37 (L) 03/14/2023    Hepatic Lab Results  Component Value  Date   AST 26 04/09/2023   ALT 18 04/09/2023   ALBUMIN 4.1 04/09/2023   ALKPHOS 30 (L) 04/09/2023    Electrolytes Lab Results  Component Value Date   NA 137 04/09/2023   K 4.0 04/09/2023   CL 101 04/09/2023   CALCIUM 8.9 04/09/2023   MG 2.2 07/17/2018    Bone Lab  Results  Component Value Date   VD25OH 66.95 09/26/2022   25OHVITD1 42 07/17/2018   25OHVITD2 <1.0 07/17/2018   25OHVITD3 42 07/17/2018    Inflammation (CRP: Acute Phase) (ESR: Chronic Phase) Lab Results  Component Value Date   CRP 3 07/17/2018   ESRSEDRATE 27 07/17/2018         Note: Above Lab results reviewed.  Imaging  DG PAIN CLINIC C-ARM 1-60 MIN NO REPORT Fluoro was used, but no Radiologist interpretation will be provided.  Please refer to "NOTES" tab for provider progress note.  Assessment  The encounter diagnosis was Chronic knee pain (Right).  Plan of Care  Problem-specific:  No problem-specific Assessment & Plan notes found for this encounter.  Wanda Martinez has a current medication list which includes the following long-term medication(s): amlodipine, calcium carbonate, citalopram, cyclobenzaprine, donepezil, gabapentin, hydrocodone-acetaminophen, [START ON 04/29/2023] hydrocodone-acetaminophen, [START ON 05/29/2023] hydrocodone-acetaminophen, losartan, magnesium oxide, metoprolol succinate, mirabegron er, pantoprazole, and rosuvastatin.  Pharmacotherapy (Medications Ordered): No orders of the defined types were placed in this encounter.  Orders:  No orders of the defined types were placed in this encounter.  Follow-up plan:   No follow-ups on file.      Interventional Therapies  Risk Factors  Considerations:      Planned  Pending:   Therapeutic left SI joint injection #2 (04/12/2023)    Under consideration:   Therapeutic right SI joint injection #4  Diagnostic right genicular NB  Diagnostic bilateral cervical facet MBB  Therapeutic right L3 and L4 TFESI #1    Completed:   Therapeutic right superior/lateral trapezius muscle TPI x1 (04/19/2021)  Therapeutic right thoracic back TPI x2 (10/17/2018) (DKFUA)  Therapeutic left thoracic back TPI x1 (10/17/2018) (DKFUA)  Therapeutic right quadratus lumborum and erector spinae muscle TPI x1  (02/12/2018)  Therapeutic bilateral lumbar facet MBB (L2-S1) (FCT: L3-4,L4-5,L5-S1) x5 (08/09/2021) (100/100/50)  Therapeutic bilateral lumbar facet MBB (L4-S1) (FCT: L5-S1) x1 (08/29/2022) (100/100/100 x3 days/R:0L:100)  Therapeutic right lumbar facet MBB (L2-S1) x4 (12/21/2020) (DKFUA)  Therapeutic right lumbar facet RFA (L2-S1) x3 (09/28/2022) (100/100/75/75)  Therapeutic right lumbar facet RFA (L3-S1) x1 (03/01/2023) (100/100/70/70)  Therapeutic left lumbar facet RFA x2 (11/30/2022) (100/60/90/>75)  Diagnostic right SI joint Blk x3 (12/21/2020)  (DKFUA)  Diagnostic left SI joint Blk x1 (08/09/2017) (100/90/20/<25)  Therapeutic right SI joint RFA x4 (03/01/2023) (100/100/70/70)  Therapeutic midline L2-3 LESI x1 (10/04/2017) (100/100/85/>50)  Therapeutic right L3-4 LESI x2 (11/04/2020) (100/100/20/<50)  Therapeutic right L4-5 LESI x2 (03/30/2022) (100/100/75/75)  Therapeutic right L5-S1 LESI x2 (12/04/2019) (100/100/50/75)  Diagnostic/therapeutic right L3 and L4 TFESI x1 (07/27/2022) (100/100/75/50)  Therapeutic right L5 TFESI x4 (02/14/2022) (100/100/70/75)  Therapeutic right S1 TFESI x1 (11/04/2020) (100/100/20/<50)  Diagnostic bilateral superficial trochanteric bursa injec. x1 (04/30/2018) (DKFUA)  Diagnostic right IA hip injection x1 (02/13/2019) (DKFUA)  Diagnostic/therapeutic right IA steroid knee inj. x4 (01/10/2023) (100/100/100/80)  Therapeutic right (Zilretta) knee injection x1 (10/06/2021)  (DKFUA)  Therapeutic right (Monovisc) knee injection x1 (06/07/2021) (100/100/100/75)    Therapeutic  Palliative (PRN) options:   Therapeutic/palliative lumbar facet RFA  Therapeutic/palliative lumbar facet MBB  Therapeutic left L2 TFESI (this level is responsible for  her low back pain and left lower extremity pain.)   Pharmacotherapy  Nonopioids transfer 12/21/2020: Gabapentin        Recent Visits Date Type Provider Dept  04/12/23 Procedure visit [name], MD [facility]  03/15/23 Office Visit [name], MD [facility]  03/01/23 Procedure visit [name], MD [facility]  02/19/23 Office Visit [name], MD [facility]  Showing recent visits within past 90 days and meeting all other requirements Future Appointments Date Type Provider Dept  04/25/23 Appointment [name], MD [facility]  05/22/23 Appointment [name], MD [facility]  06/25/23 Appointment [name], MD [facility]  Showing future appointments within next 90 days and meeting all other requirements  I discussed the assessment and treatment plan with the patient. The patient was provided an opportunity to ask questions and all were answered. The patient agreed with the plan and demonstrated an understanding of the instructions.  Patient advised to call back or seek an in-person evaluation if the symptoms or condition worsens.  Duration of encounter: *** minutes.  Note by: Oswaldo Done, MD Date: 04/25/2023; Time: 7:37 AM

## 2023-04-25 ENCOUNTER — Ambulatory Visit: Payer: Medicare HMO | Attending: Pain Medicine | Admitting: Pain Medicine

## 2023-04-25 ENCOUNTER — Telehealth: Payer: Self-pay

## 2023-04-25 DIAGNOSIS — R937 Abnormal findings on diagnostic imaging of other parts of musculoskeletal system: Secondary | ICD-10-CM | POA: Diagnosis not present

## 2023-04-25 DIAGNOSIS — E8889 Other specified metabolic disorders: Secondary | ICD-10-CM

## 2023-04-25 DIAGNOSIS — M545 Low back pain, unspecified: Secondary | ICD-10-CM

## 2023-04-25 DIAGNOSIS — Z09 Encounter for follow-up examination after completed treatment for conditions other than malignant neoplasm: Secondary | ICD-10-CM

## 2023-04-25 DIAGNOSIS — R936 Abnormal findings on diagnostic imaging of limbs: Secondary | ICD-10-CM | POA: Diagnosis not present

## 2023-04-25 DIAGNOSIS — M48062 Spinal stenosis, lumbar region with neurogenic claudication: Secondary | ICD-10-CM

## 2023-04-25 DIAGNOSIS — M1711 Unilateral primary osteoarthritis, right knee: Secondary | ICD-10-CM | POA: Diagnosis not present

## 2023-04-25 DIAGNOSIS — M4807 Spinal stenosis, lumbosacral region: Secondary | ICD-10-CM | POA: Diagnosis not present

## 2023-04-25 DIAGNOSIS — M5410 Radiculopathy, site unspecified: Secondary | ICD-10-CM | POA: Diagnosis not present

## 2023-04-25 DIAGNOSIS — M25561 Pain in right knee: Secondary | ICD-10-CM

## 2023-04-25 DIAGNOSIS — M79604 Pain in right leg: Secondary | ICD-10-CM

## 2023-04-25 DIAGNOSIS — G8929 Other chronic pain: Secondary | ICD-10-CM

## 2023-04-25 DIAGNOSIS — M48061 Spinal stenosis, lumbar region without neurogenic claudication: Secondary | ICD-10-CM

## 2023-04-25 NOTE — Patient Instructions (Signed)

## 2023-04-26 ENCOUNTER — Encounter: Payer: Self-pay | Admitting: Pain Medicine

## 2023-04-26 ENCOUNTER — Ambulatory Visit: Payer: Medicare HMO | Attending: Pain Medicine | Admitting: Pain Medicine

## 2023-04-26 VITALS — BP 130/76 | HR 68 | Temp 97.3°F | Resp 14 | Ht 64.0 in | Wt 176.0 lb

## 2023-04-26 DIAGNOSIS — R936 Abnormal findings on diagnostic imaging of limbs: Secondary | ICD-10-CM | POA: Diagnosis not present

## 2023-04-26 DIAGNOSIS — E8889 Other specified metabolic disorders: Secondary | ICD-10-CM | POA: Diagnosis not present

## 2023-04-26 DIAGNOSIS — M25561 Pain in right knee: Secondary | ICD-10-CM | POA: Diagnosis not present

## 2023-04-26 DIAGNOSIS — M1711 Unilateral primary osteoarthritis, right knee: Secondary | ICD-10-CM | POA: Diagnosis not present

## 2023-04-26 DIAGNOSIS — G8929 Other chronic pain: Secondary | ICD-10-CM | POA: Diagnosis not present

## 2023-04-26 MED ORDER — METHYLPREDNISOLONE ACETATE 80 MG/ML IJ SUSP
INTRAMUSCULAR | Status: AC
  Filled 2023-04-26: qty 1

## 2023-04-26 MED ORDER — METHYLPREDNISOLONE ACETATE 80 MG/ML IJ SUSP
80.0000 mg | Freq: Once | INTRAMUSCULAR | Status: AC
  Administered 2023-04-26: 80 mg via INTRA_ARTICULAR

## 2023-04-26 MED ORDER — LIDOCAINE HCL (PF) 2 % IJ SOLN
5.0000 mL | Freq: Once | INTRAMUSCULAR | Status: AC
  Administered 2023-04-26: 5 mL
  Filled 2023-04-26: qty 5

## 2023-04-26 MED ORDER — PENTAFLUOROPROP-TETRAFLUOROETH EX AERO
INHALATION_SPRAY | Freq: Once | CUTANEOUS | Status: AC
  Administered 2023-04-26: 30 via TOPICAL
  Filled 2023-04-26: qty 116

## 2023-04-26 MED ORDER — ROPIVACAINE HCL 2 MG/ML IJ SOLN
3.0000 mL | Freq: Once | INTRAMUSCULAR | Status: AC
  Administered 2023-04-26: 20 mL via INTRA_ARTICULAR
  Filled 2023-04-26: qty 20

## 2023-04-26 MED ORDER — TRIAMCINOLONE ACETONIDE 32 MG IX SRER
32.0000 mg | Freq: Once | INTRA_ARTICULAR | Status: DC
  Filled 2023-04-26: qty 5

## 2023-04-26 NOTE — Progress Notes (Signed)
PROVIDER NOTE: Interpretation of information contained herein should be left to medically-trained personnel. Specific patient instructions are provided elsewhere under "Patient Instructions" section of medical record. This document was created in part using STT-dictation technology, any transcriptional errors that may result from this process are unintentional.  Patient: Wanda Martinez Type: Established DOB: Feb 28, 1935 MRN: [id number] PCP: Dale [place], MD  Service: Procedure DOS: 04/26/2023 Setting: Ambulatory Location: Ambulatory outpatient facility Delivery: Face-to-face Provider: Oswaldo Done, MD Specialty: Interventional Pain Management Specialty designation: 09 Location: Outpatient facility Ref. Prov.: [name], MD       Interventional Therapy   Type:  Steroid Intra-articular Knee Injection          Laterality: Right (-RT) Level/approach: Lateral Imaging guidance: None required ([id number]) Anesthesia: Local anesthesia (1-2% Lidocaine) Anxiolysis: None                 Sedation: No Sedation                       DOS: 04/26/2023  Performed by: Oswaldo Done, MD  Purpose: Diagnostic/Therapeutic Indications: Knee arthralgia associated to osteoarthritis of the knee 1. Chronic knee pain (Right)   2. Osteoarthritis of knee (Right)   3. Hoffa's fat pad disease (HCC) (Right)   4. Hoffa's pad syndrome (HCC) (Right)   5. Tricompartment osteoarthritis of knee (Right)    NAS-11 score:   Pre-procedure: 8 /10   Post-procedure: 0-No pain/10   Note: Today I had scheduled the patient to return for a Zilretta knee injection however, the hospital has ran out of supply.  The patient was consulted and she was okay to proceed with a different steroid.     Pre-Procedure Preparation  Monitoring: As per clinic protocol.  Risk Assessment: Vitals:  [id number] body mass index is 30.21 kg/m as calculated from the following:   Height as of this encounter: 5\' 4"   (1.626 m).   Weight as of this encounter: 176 lb (79.8 kg)., Rate:68 , BP:130/76, Resp:14, Temp:(!) 97.3 F (36.3 C), SpO2:100 %  Allergies: She is allergic to sulfa antibiotics and vesicare [solifenacin].  Precautions: No additional precautions required  Blood-thinner(s): None at this time  Coagulopathies: Reviewed. None identified.   Active Infection(s): Reviewed. None identified. Wanda Martinez is afebrile   Location setting: Exam room Position: Sitting w/ knee bent 90 degrees Safety Precautions: Patient was assessed for positional comfort and pressure points before starting the procedure. Prepping solution: DuraPrep (Iodine Povacrylex [0.7% available iodine] and Isopropyl Alcohol, 74% w/w) Prep Area: Entire knee region Approach: percutaneous, just above the tibial plateau, lateral to the infrapatellar tendon. Intended target: Intra-articular knee space Materials: Tray: Block Needle(s): Regular Qty: 1/side Length: 1.5-inch Gauge: 25G (x1) + 22G (x1)  Meds ordered this encounter  Medications   pentafluoroprop-tetrafluoroeth (GEBAUERS) aerosol   lidocaine HCl (PF) (XYLOCAINE) 2 % injection 5 mL   ropivacaine (PF) 2 mg/mL (0.2%) (NAROPIN) injection 3 mL   DISCONTD: Triamcinolone Acetonide (ZILRETTA) intra-articular injection 32 mg    Maintain refrigerated.  Prepared suspension may be stored up to 4 hours at ambient conditions.   methylPREDNISolone acetate (DEPO-MEDROL) injection 80 mg    Orders Placed This Encounter  Procedures   KNEE INJECTION    Local Anesthetic & Steroid injection.    Scheduling Instructions:     Side(s): Right Knee     Sedation: None     Timeframe: Today    Order Specific Question:   Where will this procedure be performed?  Answer:   [facility]   Informed Consent Details: Physician/Practitioner Attestation; Transcribe to consent form and obtain patient signature    Note: Always confirm laterality of pain with Wanda Martinez, before  procedure. Transcribe to consent form and obtain patient signature.    Order Specific Question:   Physician/Practitioner attestation of informed consent for procedure/surgical case    Answer:   I, the physician/practitioner, attest that I have discussed with the patient the benefits, risks, side effects, alternatives, likelihood of achieving goals and potential problems during recovery for the procedure that I have provided informed consent.    Order Specific Question:   Procedure    Answer:   Right-sided intra-articular knee arthrocentesis (aspiration and/or injection)    Order Specific Question:   Physician/Practitioner performing the procedure    Answer:   Doryan Bahl A. Laban Emperor, MD    Order Specific Question:   Indication/Reason    Answer:   Chronic right-sided knee pain secondary to knee arthropathy/arthralgia   Follow-up    Schedule Wanda Martinez for a post-procedure follow-up evaluation encounter 2 weeks from now.    Standing Status:   Future    Standing Expiration Date:   05/10/2023    Scheduling Instructions:     Schedule follow-up visit on afternoon of procedure day (T, Th)     Type: Face-to-face (F2F) Post-procedure (PP) evaluation (E/M)     When: 2 weeks from now   Provide equipment / supplies at bedside    Procedure tray: "Block Tray" (Disposable  single use) Skin infiltration needle: Regular 1.5-in, 25-G, (x1) Block Needle type: Regular Amount/quantity: 1 Size: Short(1.5-inch) Gauge: (25G x1) + (22G x1)    Standing Status:   Standing    Number of Occurrences:   1    Order Specific Question:   Specify    Answer:   Block Tray     Time-out: 1357 I initiated and conducted the "Time-out" before starting the procedure, as per protocol. The patient was asked to participate by confirming the accuracy of the "Time Out" information. Verification of the correct person, site, and procedure were performed and confirmed by me, the nursing staff, and the patient. "Time-out" conducted as per  Joint Commission's Universal Protocol (UP.01.01.01). Procedure checklist: Completed  H&P (Pre-op  Assessment)  Wanda Martinez is a 88 y.o. (year old), female patient, seen today for interventional treatment. She  has a past surgical history that includes Tonsillectomy and adenoidectomy (1979); Vesicovaginal fistula closure w/ TAH (1983); Breast surgery (1986); Breast enhancement surgery (1987); Breast implant removal; Breast implant removal (Right, 08/29/2012); Mastectomy (08/2012); Abdominal hysterectomy; Colonoscopy with propofol (N/A, 09/13/2016); Breast biopsy (2013); and Blepharoplasty. Wanda Martinez has a current medication list which includes the following prescription(s): amlodipine, aspirin ec, calcium carbonate, citalopram, cyclobenzaprine, donepezil, erythromycin, gabapentin, glucosamine sulfate, glucosamine-chondroitin, hydrocodone-acetaminophen, hydrocodone-acetaminophen, [START ON 05/29/2023] hydrocodone-acetaminophen, hydrocortisone, letrozole, losartan, lubiprostone, magnesium oxide, melatonin, metoprolol succinate, mirabegron er, multivitamin, naloxone, nitrofurantoin (macrocrystal-monohydrate), omega-3, pantoprazole, and rosuvastatin. Her primarily concern today is the Knee Pain (right)  She is allergic to sulfa antibiotics and vesicare [solifenacin].   Last encounter: My last encounter with her was on 04/25/2023. Pertinent problems: Wanda Martinez has Headache; Parkinson's disease; Chronic low back pain (1ry area of Pain) (Bilateral) (R>L) w/o sciatica; Lumbar spondylosis; Chronic hip pain (Bilateral); Chronic neck pain; Cervical spondylosis; Chronic cervical radicular pain (Right); Diffuse myofascial pain syndrome; Neurogenic pain; Chronic upper back pain (Right); Myofascial pain syndrome (Right) (cervicothoracic); Lumbar facet syndrome (Bilateral) (R>L); History of breast cancer; Cervical facet hypertrophy; Cervical facet  syndrome (HCC); Chronic shoulder pain (Right); Chronic pain syndrome; Chronic  sacroiliac joint pain (Left); Chronic sacroiliac joint pain (Right); 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, L4-5, L5-S1) (Right); Lumbar spinal stenosis (with neurogenic claudication) (L3-4); Chronic lower extremity pain (2ry area of Pain) (Right); Chronic lumbar radicular pain (S1) (Right); Trochanteric bursitis of hip (Bilateral); Spondylosis without myelopathy or radiculopathy, lumbosacral region; Trigger point with back pain (Right); DDD (degenerative disc disease), lumbosacral; Chronic upper extremity pain (Right); Chronic thoracic back pain (Bilateral) (L>R); Trigger point of thoracic region (Bilateral) (L>R); Other specified dorsopathies, sacral and sacrococcygeal region; Sacroiliac joint dysfunction (Right); Osteoarthritis of sacroiliac joint (Right); Somatic dysfunction of sacroiliac joint (Right); Chronic musculoskeletal pain; Facial pain; Chronic hip pain (Right); Osteoarthritis of hip (Right); Left ear pain; 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HCC); Abnormal MRI, lumbar spine (08/23/2022); Osteoarthritis involving multiple joints; Other spondylosis, sacral and sacrococcygeal region; Lumbar facet hypertrophy (Multilevel) (Bilateral); Chronic knee pain (Right); Osteoarthritis of knee (Right); Trigger point of shoulder region (Right); Osteoarthritis of AC (acromioclavicular) joint (Right); Osteoarthritis of shoulder (Right); Unspecified injury of muscle(s) and tendon(s) of the rotator cuff of shoulder, sequela (Right); Cervicalgia; DDD (degenerative disc disease), lumbar; Lumbar radicular pain (Bilateral); Lumbar foraminal stenosis (Right: L3-4, L4-5, L5-S1) (Left: L2-3); Abnormal MRI, hip (Right) (05/04/2022); Abnormal MRI, knee (01/04/2022); Tricompartment osteoarthritis of knee (Right); Chronic low back pain (Right) w/o sciatica; Hoffa's fat pad disease (HCC) (Right); and Hoffa's pad syndrome (HCC) (Right) on their pertinent problem list. Pain Assessment: Severity of Chronic pain is reported as a 8 /10.  Location: Knee Right/denies. Onset: More than a month ago. Quality: Lambert Mody, Aching. Timing: Constant. Modifying factor(s): rest, non weight-bearing, topical cream. Vitals:  height is 5\' 4"  (1.626 m) and weight is 176 lb (79.8 kg). Her temporal temperature is 97.3 F (36.3 C) (abnormal). Her blood pressure is 130/76 and her pulse is 68. Her respiration is 14 and oxygen saturation is 100%.   Reason for encounter: "interventional pain management therapy due pain of at least four (4) weeks in duration, with failure to respond and/or inability to tolerate more conservative care.  Site Confirmation: Wanda Martinez was asked to confirm the procedure and laterality before marking the site.  Consent: Before the procedure and under the influence of no sedative(s), amnesic(s), or anxiolytics, the patient was informed of the treatment options, risks and possible complications. To fulfill our ethical and legal obligations, as recommended by the American Medical Association's Code of Ethics, I have informed the patient of my clinical impression; the nature and purpose of the treatment or procedure; the risks, benefits, and possible complications of the intervention; the alternatives, including doing nothing; the risk(s) and benefit(s) of the alternative treatment(s) or procedure(s); and the risk(s) and benefit(s) of doing nothing. The patient was provided information about the general risks and possible complications associated with the procedure. These may include, but are not limited to: failure to achieve desired goals, infection, bleeding, organ or nerve damage, allergic reactions, paralysis, and death. In addition, the patient was informed of those risks and complications associated to Spine-related procedures, such as failure to decrease pain; infection (i.e.: Meningitis, epidural or intraspinal abscess); bleeding (i.e.: epidural hematoma, subarachnoid hemorrhage, or any other type of intraspinal or peri-dural  bleeding); organ or nerve damage (i.e.: Any type of peripheral nerve, nerve root, or spinal cord injury) with subsequent damage to sensory, motor, and/or autonomic systems, resulting in permanent pain, numbness, and/or weakness of one or several areas of the body; allergic reactions; (i.e.: anaphylactic reaction); and/or death. Furthermore, the patient  was informed of those risks and complications associated with the medications. These include, but are not limited to: allergic reactions (i.e.: anaphylactic or anaphylactoid reaction(s)); adrenal axis suppression; blood sugar elevation that in diabetics may result in ketoacidosis or comma; water retention that in patients with history of congestive heart failure may result in shortness of breath, pulmonary edema, and decompensation with resultant heart failure; weight gain; swelling or edema; medication-induced neural toxicity; particulate matter embolism and blood vessel occlusion with resultant organ, and/or nervous system infarction; and/or aseptic necrosis of one or more joints. Finally, the patient was informed that Medicine is not an exact science; therefore, there is also the possibility of unforeseen or unpredictable risks and/or possible complications that may result in a catastrophic outcome. The patient indicated having understood very clearly. We have given the patient no guarantees and we have made no promises. Enough time was given to the patient to ask questions, all of which were answered to the patient's satisfaction. Wanda Martinez has indicated that she wanted to continue with the procedure. Attestation: I, the ordering provider, attest that I have discussed with the patient the benefits, risks, side-effects, alternatives, likelihood of achieving goals, and potential problems during recovery for the procedure that I have provided informed consent.  Date  Time: 04/26/2023  1:35 PM  Description of procedure  Start Time: 1357 hrs  Local  Anesthesia: Once the patient was positioned, prepped, and time-out was completed. The target area was identified located. The skin was marked with an approved surgical skin marker. Once marked, the skin (epidermis, dermis, and hypodermis), and deeper tissues (fat, connective tissue and muscle) were infiltrated with a small amount of a short-acting local anesthetic, loaded on a 10cc syringe with a 25G, 1.5-in  Needle. An appropriate amount of time was allowed for local anesthetics to take effect before proceeding to the next step. Local Anesthetic: Lidocaine 1-2% The unused portion of the local anesthetic was discarded in the proper designated containers. Safety Precautions: Aspiration looking for blood return was conducted prior to all injections. At no point did I inject any substances, as a needle was being advanced. Before injecting, the patient was told to immediately notify me if she was experiencing any new onset of "ringing in the ears, or metallic taste in the mouth". No attempts were made at seeking any paresthesias. Safe injection practices and needle disposal techniques used. Medications properly checked for expiration dates. SDV (single dose vial) medications used. After the completion of the procedure, all disposable equipment used was discarded in the proper designated medical waste containers.  Technical description: Protocol guidelines were followed. After positioning, the target area was identified and prepped in the usual manner. Skin & deeper tissues infiltrated with local anesthetic. Appropriate amount of time allowed to pass for local anesthetics to take effect. Proper needle placement secured. Once satisfactory needle placement was confirmed, I proceeded to inject the desired solution in slow, incremental fashion, intermittently assessing for discomfort or any signs of abnormal or undesired spread of substance. Once completed, the needle was removed and disposed of, as per hospital  protocols. The area was cleaned, making sure to leave some of the prepping solution back to take advantage of its long term bactericidal properties.  Aspiration:  Negative        Vitals:   04/26/23 1335  BP: 130/76  Pulse: 68  Resp: 14  Temp: (!) 97.3 F (36.3 C)  TempSrc: Temporal  SpO2: 100%  Weight: 176 lb (79.8 kg)  Height: 5\' 4"  (  1.626 m)    End Time: 1358 hrs  Imaging guidance  Imaging-assisted Technique: None required. Indication(s): N/A Exposure Time: N/A Contrast: None Fluoroscopic Guidance: N/A Ultrasound Guidance: N/A Interpretation: N/A  Post-op assessment  Post-procedure Vital Signs:  Pulse/HCG Rate: 68  Temp: (!) 97.3 F (36.3 C) Resp: 14 BP: 130/76 SpO2: 100 %  EBL: None  Complications: No immediate post-treatment complications observed by team, or reported by patient.  Note: The patient tolerated the entire procedure well. A repeat set of vitals were taken after the procedure and the patient was kept under observation following institutional policy, for this type of procedure. Post-procedural neurological assessment was performed, showing return to baseline, prior to discharge. The patient was provided with post-procedure discharge instructions, including a section on how to identify potential problems. Should any problems arise concerning this procedure, the patient was given instructions to immediately contact us, at any time, without hesitation. In any case, we plan to contact the patient by telephone for a follow-up status report regarding this interventional procedure.  Comments:  No additional relevant information.  Plan of care  Chronic Opioid Analgesic:  Hydrocodone/APAP 5/325, 1 tab PO q 8 hrs (15 mg/day of hydrocodone) MME/day: 15 mg/day.   Medications administered: We administered pentafluoroprop-tetrafluoroeth, lidocaine HCl (PF), ropivacaine (PF) 2 mg/mL (0.2%), and methylPREDNISolone acetate.  Follow-up plan:   Return in about 2  weeks (around 05/10/2023) for Proc-day (T,Th), (Face2F), (PPE).      Interventional Therapies  Risk Factors  Considerations:   Parkinson's disease  Hx right breast cancer  OIC  SOB  CKD Stage 3  GERD  depression  osteopenia/osteoporosis     Planned  Pending:   Therapeutic/palliative right IA knee Zilretta knee injection #2    Under consideration:   Therapeutic right SI joint injection #4  Diagnostic right genicular NB #1  Diagnostic bilateral cervical facet MBB  Therapeutic right L3 and L4 TFESI #1    Completed:   Therapeutic right superior/lateral trapezius muscle TPI x1 (04/19/2021)  Therapeutic right thoracic back TPI x2 (10/17/2018) (DKFUA)  Therapeutic left thoracic back TPI x1 (10/17/2018) (DKFUA)  Therapeutic right quadratus lumborum and erector spinae muscle TPI x1 (02/12/2018)  Therapeutic bilateral lumbar facet MBB (L2-S1) (FCT: L3-4,L4-5,L5-S1) x5 (08/09/2021) (100/100/50)  Therapeutic bilateral lumbar facet MBB (L4-S1) (FCT: L5-S1) x1 (08/29/2022) (100/100/100 x3 days/R:0L:100)  Therapeutic right lumbar facet MBB (L2-S1) x4 (12/21/2020) (DKFUA)  Therapeutic right lumbar facet RFA (L2-S1) x3 (09/28/2022) (100/100/75/75)  Therapeutic right lumbar facet RFA (L3-S1) x1 (03/01/2023) (100/100/70/70)  Therapeutic left lumbar facet RFA x2 (11/30/2022) (100/60/90/>75)  Diagnostic right SI joint Blk x3 (12/21/2020)  (DKFUA)  Diagnostic left SI joint Blk x2 (04/12/2023) (100/90/20/<25)  Therapeutic right SI joint RFA x4 (03/01/2023) (100/100/70/70)  Therapeutic midline L2-3 LESI x1 (10/04/2017) (100/100/85/>50)  Therapeutic right L3-4 LESI x2 (11/04/2020) (100/100/20/<50)  Therapeutic right L4-5 LESI x2 (03/30/2022) (100/100/75/75)  Therapeutic right L5-S1 LESI x2 (12/04/2019) (100/100/50/75)  Diagnostic/therapeutic right L3 and L4 TFESI x1 (07/27/2022) (100/100/75/50)  Therapeutic right L5 TFESI x4 (02/14/2022) (100/100/70/75)  Therapeutic right S1 TFESI x1 (11/04/2020)  (100/100/20/<50)  Diagnostic bilateral superficial trochanteric bursa injec. x1 (04/30/2018) (DKFUA)  Diagnostic right IA hip injection x1 (02/13/2019) (DKFUA)  Diagnostic/therapeutic right IA steroid knee inj. x4 (01/10/2023) (100/100/100/80)  Therapeutic right (Zilretta) knee injection x1 (10/06/2021) (DKFUA)  Therapeutic right (Monovisc) knee injection x1 (06/07/2021) (100/100/100/75)    Therapeutic  Palliative (PRN) options:   Therapeutic/palliative lumbar facet RFA  Therapeutic/palliative lumbar facet MBB  Therapeutic left L2 TFESI (this level is responsible  for her low back pain and left lower extremity pain.)   Pharmacotherapy  Nonopioids transfer 12/21/2020: Gabapentin      Recent Visits Date Type Provider Dept  04/26/23 Procedure visit [name], MD [facility]  04/25/23 Office Visit [name], MD [facility]  04/12/23 Procedure visit [name], MD [facility]  03/15/23 Office Visit [name], MD [facility]  03/01/23 Procedure visit [name], MD [facility]  02/19/23 Office Visit [name], MD [facility]  Showing recent visits within past 90 days and meeting all other requirements Future Appointments Date Type Provider Dept  05/22/23 Appointment [name], MD [facility]  06/05/23 Appointment [name], MD [facility]  06/25/23 Appointment [name], MD [facility]  Showing future appointments within next 90 days and meeting all other requirements   Disposition: Discharge home  Discharge (Date  Time): 04/26/2023; 1400 hrs.   Primary Care Physician: Dale [place] Meade, MD Location: [facility] Note by: Oswaldo Done, MD Date: 04/26/2023; Time: 6:46 AM  DISCLAIMER: Medicine is not an Visual merchandiser. It has no guarantees or warranties. The decision to proceed with this  intervention was based on the information collected from the patient. Conclusions were drawn from the patient's questionnaire, interview, and examination. Because information was provided in large part by the patient, it cannot be guaranteed that it has not been purposely or unconsciously manipulated or altered. Every effort has been made to obtain as much accurate, relevant, available data as possible. Always take into account that the treatment will also be dependent on availability of resources and existing treatment guidelines, considered by other Pain Management Specialists as being common knowledge and practice, at the time of the intervention. It is also important to point out that variation in procedural techniques and pharmacological choices are the acceptable norm. For Medico-Legal review purposes, the indications, contraindications, technique, and results of the these procedures should only be evaluated, judged and interpreted by a Board-Certified Interventional Pain Specialist with extensive familiarity and expertise in the same exact procedure and technique.

## 2023-04-27 ENCOUNTER — Telehealth: Payer: Self-pay | Admitting: *Deleted

## 2023-04-27 NOTE — Telephone Encounter (Signed)
Post procedure call; patient reports that she is doing well.  No concerns.

## 2023-05-02 ENCOUNTER — Encounter: Payer: Self-pay | Admitting: Internal Medicine

## 2023-05-02 ENCOUNTER — Encounter: Payer: Self-pay | Admitting: Cardiology

## 2023-05-02 ENCOUNTER — Ambulatory Visit: Payer: Medicare HMO | Attending: Cardiology | Admitting: Cardiology

## 2023-05-02 VITALS — BP 132/64 | HR 52 | Ht 64.0 in | Wt 177.6 lb

## 2023-05-02 DIAGNOSIS — E7800 Pure hypercholesterolemia, unspecified: Secondary | ICD-10-CM

## 2023-05-02 DIAGNOSIS — I10 Essential (primary) hypertension: Secondary | ICD-10-CM | POA: Diagnosis not present

## 2023-05-02 DIAGNOSIS — I471 Supraventricular tachycardia, unspecified: Secondary | ICD-10-CM | POA: Insufficient documentation

## 2023-05-02 NOTE — Patient Instructions (Signed)
Medication Instructions:   Your physician recommends that you continue on your current medications as directed. Please refer to the Current Medication list given to you today.  *If you need a refill on your cardiac medications before your next appointment, please call your pharmacy*   Lab Work:  None Ordered  If you have labs (blood work) drawn today and your tests are completely normal, you will receive your results only by: MyChart Message (if you have MyChart) OR A paper copy in the mail If you have any lab test that is abnormal or we need to change your treatment, we will call you to review the results.   Testing/Procedures:  Your physician has requested that you have an echocardiogram. Echocardiography is a painless test that uses sound waves to create images of your heart. It provides your doctor with information about the size and shape of your heart and how well your heart's chambers and valves are working. This procedure takes approximately one hour. There are no restrictions for this procedure. Please do NOT wear cologne, perfume, aftershave, or lotions (deodorant is allowed). Please arrive 15 minutes prior to your appointment time.    Follow-Up: At [facility], you and your health needs are our priority.  As part of our continuing mission to provide you with exceptional heart care, we have created designated Provider Care Teams.  These Care Teams include your primary Cardiologist (physician) and Advanced Practice Providers (APPs -  Physician Assistants and Nurse Practitioners) who all work together to provide you with the care you need, when you need it.  We recommend signing up for the patient portal called "MyChart".  Sign up information is provided on this After Visit Summary.  MyChart is used to connect with patients for Virtual Visits (Telemedicine).  Patients are able to view lab/test results, encounter notes, upcoming appointments, etc.  Non-urgent messages can  be sent to your provider as well.   To learn more about what you can do with MyChart, go to [email or web address].    Your next appointment:    After Echocardiogram  Provider:   You may see Brian Agbor-Etang, MD or one of the following Advanced Practice Providers on your designated Care Team:   Christopher Berge, NP Ryan Dunn, PA-C Cadence Furth, PA-C Sheri Hammock, NP 

## 2023-05-02 NOTE — Progress Notes (Signed)
Cardiology Office Note:    Date:  05/02/2023   ID:  [name], [name] Dec 15, 1935, MRN [id number]  PCP:  Dale Richards, MD   [facility] Providers Cardiologist:  Debbe Odea, MD     Referring MD: Dale Cottondale, MD   Chief Complaint  Patient presents with   New Patient (Initial Visit)    Establish care today and has been seen by [facility] cardiology in the past.  Patient denies new or acute cardiac problems/concerns today.      History of Present Illness:    Wanda Martinez is a 88 y.o. female with a hx of hypertension, hyperlipidemia, Parkinson's disease/tremors, CKD who presents to establish care.  Previously seen by [facility] cardiology from a cardiac perspective.  Has a history of palpitations, cardiac monitor revealed nonsustained SVT, started on Toprol-XL 50 mg daily.  Symptoms of palpitations have been adequately controlled on metoprolol.  Blood pressures and heart rate are usually within normal limits.  She denies chest pain, shortness of breath, dizziness, presyncope or syncope.  Echocardiogram 08/2020 EF 60% Pharmacologic MPI 11/2019 no ischemia.  Past Medical History:  Diagnosis Date   Acute postoperative pain 10/25/2017   Anemia    Arm pain 07/26/2015   Arthritis    Arthritis, degenerative 03/26/2014   Back pain 11/01/2013   Bladder infection 06/2018   Breast cancer (HCC)    Masectomy - left - 1986    Breast cancer [facility])    Mastectomy-right -2014   CHEST PAIN 04/29/2010   Qualifier: Diagnosis of  By: Laural Benes RN, Erika     Chronic cystitis    Cystocele 02/22/2013   Cystocele, midline 08/19/2013   Degeneration of intervertebral disc of lumbosacral region 03/26/2014   DYSPNEA 04/29/2010   Qualifier: Diagnosis of  By: Laural Benes RN, Erika     Enthesopathy of hip 03/26/2014   GERD (gastroesophageal reflux disease)    Hiatal hernia    HTN (hypertension)    Hypokalemia 06/2018   Hyponatremia 06/2018   LBP (low back pain) 03/26/2014   Neck pain 11/01/2013    Parkinson disease    Pneumonia 06/2018   Sinusitis 02/07/2015   Skin lesions 07/12/2014   Urinary incontinence    mixed     Past Surgical History:  Procedure Laterality Date   ABDOMINAL HYSTERECTOMY     BLEPHAROPLASTY     BREAST BIOPSY  2013   BREAST ENHANCEMENT SURGERY  1987   BREAST IMPLANT REMOVAL     BREAST IMPLANT REMOVAL Right 08/29/2012   BREAST SURGERY  1986   s/p left mastectomy   COLONOSCOPY WITH PROPOFOL N/A 09/13/2016   Procedure: COLONOSCOPY WITH PROPOFOL;  Surgeon: Scot Jun, MD;  Location[facility] ENDOSCOPY;  Service: Endoscopy;  Laterality: N/A;   MASTECTOMY  08/2012   right   TONSILLECTOMY AND ADENOIDECTOMY  1979   VESICOVAGINAL FISTULA CLOSURE W/ TAH  1983    Current Medications: Current Meds  Medication Sig   amLODipine (NORVASC) 2.5 MG tablet Take 1 tablet (2.5 mg total) by mouth daily.   aspirin 81 MG EC tablet Take 81 mg by mouth daily as needed.   calcium carbonate (OSCAL) 1500 (600 Ca) MG TABS tablet Take 1 tablet by mouth daily.   citalopram (CELEXA) 20 MG tablet TAKE 1 TABLET BY MOUTH ONCE DAILY.   cyclobenzaprine (FLEXERIL) 10 MG tablet TAKE ONE TABLET BY MOUTH AT BEDTIME.   donepezil (ARICEPT) 10 MG tablet TAKE ONE TABLET BY MOUTH AT BEDTIME   erythromycin ophthalmic ointment Apply  to eye.   gabapentin (NEURONTIN) 300 MG capsule Take 1 capsule (300 mg total) by mouth at bedtime.   Glucosamine-Chondroitin 500-400 MG CAPS Take 2 capsules by mouth daily.   HYDROcodone-acetaminophen (NORCO/VICODIN) 5-325 MG tablet Take 1 tablet by mouth 3 (three) times daily. Must last 30 days.   HYDROcodone-acetaminophen (NORCO/VICODIN) 5-325 MG tablet Take 1 tablet by mouth 3 (three) times daily. Must last 30 days.   [START ON 05/29/2023] HYDROcodone-acetaminophen (NORCO/VICODIN) 5-325 MG tablet Take 1 tablet by mouth 3 (three) times daily. Must last 30 days.   hydrocortisone (ANUSOL-HC) 25 MG suppository Place 1 suppository (25 mg total) rectally 2 (two) times  daily.   letrozole (FEMARA) 2.5 MG tablet Take 2.5 mg by mouth daily.   losartan (COZAAR) 50 MG tablet TAKE (2) TABLETS BY MOUTH ONCE DAILY.   lubiprostone (AMITIZA) 24 MCG capsule TAKE 1 CAPSULE BY MOUTH ONCE DAILY WITH BREAKFAST.   magnesium oxide (MAG-OX) 400 (240 Mg) MG tablet TAKE 1 TABLET BY MOUTH ONCE DAILY.   melatonin 1 MG TABS tablet Take 3 mg by mouth at bedtime.   metoprolol succinate (TOPROL-XL) 25 MG 24 hr tablet Take 1 tablet (25 mg total) by mouth 2 (two) times daily.   mirabegron ER (MYRBETRIQ) 50 MG TB24 tablet Take 1 tablet (50 mg total) by mouth daily.   Multiple Vitamin (MULTIVITAMIN) tablet Take 1 tablet by mouth daily.   naloxone (NARCAN) nasal spray 4 mg/0.1 mL Place 1 spray into the nose as needed for up to 365 doses (for opioid-induced respiratory depresssion). In case of emergency (overdose), spray once into each nostril. If no response within 3 minutes, repeat application and call 911.   nitrofurantoin, macrocrystal-monohydrate, (MACROBID) 100 MG capsule Take 1 capsule (100 mg total) by mouth daily.   Omega-3 1000 MG CAPS Take by mouth.   pantoprazole (PROTONIX) 40 MG tablet Take 1 tablet (40 mg total) by mouth daily.   rosuvastatin (CRESTOR) 20 MG tablet TAKE 1 TABLET BY MOUTH ONCE DAILY.     Allergies:   Sulfa antibiotics and Vesicare [solifenacin]   Social History   Socioeconomic History   Marital status: Married    Spouse name: Not on file   Number of children: 3   Years of education: Not on file   Highest education level: Not on file  Occupational History   Not on file  Tobacco Use   Smoking status: Never    Passive exposure: Never   Smokeless tobacco: Never   Tobacco comments:    tobacco use - no  Vaping Use   Vaping Use: Never used  Substance and Sexual Activity   Alcohol use: No    Alcohol/week: 0.0 standard drinks of alcohol   Drug use: No   Sexual activity: Never  Other Topics Concern   Not on file  Social History Narrative   Not on  file   Social Determinants of Health   Financial Resource Strain: Low Risk  (07/10/2019)   Overall Financial Resource Strain (CARDIA)    Difficulty of Paying Living Expenses: Not hard at all  Food Insecurity: No Food Insecurity (07/10/2019)   Hunger Vital Sign    Worried About Running Out of Food in the Last Year: Never true    Ran Out of Food in the Last Year: Never true  Transportation Needs: No Transportation Needs (07/12/2020)   PRAPARE - Administrator, Civil Service (Medical): No    Lack of Transportation (Non-Medical): No  Physical Activity: Not on file  Stress: No Stress Concern Present (07/12/2020)   [employer] of Occupational Health - Occupational Stress Questionnaire    Feeling of Stress : Not at all  Social Connections: Socially Integrated (07/12/2020)   Social Connection and Isolation Panel [NHANES]    Frequency of Communication with Friends and Family: Three times a week    Frequency of Social Gatherings with Friends and Family: More than three times a week    Attends Religious Services: More than 4 times per year    Active Member of [employer] or Organizations: Yes    Attends Banker Meetings: 1 to 4 times per year    Marital Status: Married     Family History: The patient's family history includes Colon polyps in her father; Diabetes in her father; Parkinson's disease in her mother; Stroke in her father and mother.  ROS:   Please see the history of present illness.     All other systems reviewed and are negative.  EKGs/Labs/Other Studies Reviewed:    The following studies were reviewed today:   EKG:  EKG is  ordered today.  The ekg ordered today demonstrates sinus bradycardia, heart rate 52.  Recent Labs: 09/26/2022: TSH 1.42 04/09/2023: ALT 18; BUN 17; Creatinine, Ser 1.05; Potassium 4.0; Sodium 137 04/16/2023: Hemoglobin 10.5; Platelets 237  Recent Lipid Panel    Component Value Date/Time   CHOL 123 01/31/2023 1325   TRIG 112.0  01/31/2023 1325   HDL 56.20 01/31/2023 1325   CHOLHDL 2 01/31/2023 1325   VLDL 22.4 01/31/2023 1325   LDLCALC 44 01/31/2023 1325   LDLCALC 101 (H) 06/11/2020 1512   LDLDIRECT 73.0 07/09/2018 1010     Risk Assessment/Calculations:             Physical Exam:    VS:  BP 132/64 (BP Location: Left Arm, Patient Position: Sitting, Cuff Size: Large)   Pulse (!) 52   Ht 5\' 4"  (1.626 m)   Wt 177 lb 9.6 oz (80.6 kg)   LMP 12/18/1981   SpO2 97%   BMI 30.48 kg/m     Wt Readings from Last 3 Encounters:  05/02/23 177 lb 9.6 oz (80.6 kg)  04/26/23 176 lb (79.8 kg)  04/12/23 181 lb (82.1 kg)     GEN:  Well nourished, well developed in no acute distress HEENT: Normal NECK: No JVD; No carotid bruits CARDIAC: RRR, distant heart sounds RESPIRATORY: Poor inspiratory effort, no wheezing ABDOMEN: Soft, non-tender, non-distended MUSCULOSKELETAL:  No edema; No deformity  SKIN: Warm and dry NEUROLOGIC:  Alert and oriented x 3 PSYCHIATRIC:  Normal affect   ASSESSMENT:    1. Paroxysmal SVT (supraventricular tachycardia)   2. Primary hypertension   3. Pure hypercholesterolemia    PLAN:    In order of problems listed above:  History of paroxysmal SVT, continue Toprol-XL 50 daily, get echo.  Previous EF normal at 60% Hypertension, BP controlled, continue Norvasc, losartan, Toprol-XL. Hyperlipidemia, cholesterol controlled, continue Crestor 20 mg daily.  Follow-up in 2 to 3 months.      Medication Adjustments/Labs and Tests Ordered: Current medicines are reviewed at length with the patient today.  Concerns regarding medicines are outlined above.  Orders Placed This Encounter  Procedures   EKG 12-Lead   ECHOCARDIOGRAM COMPLETE   No orders of the defined types were placed in this encounter.   Patient Instructions  Medication Instructions:   Your physician recommends that you continue on your current medications as directed. Please refer to the Current Medication list given  to  you today.  *If you need a refill on your cardiac medications before your next appointment, please call your pharmacy*   Lab Work:  None Ordered  If you have labs (blood work) drawn today and your tests are completely normal, you will receive your results only by: MyChart Message (if you have MyChart) OR A paper copy in the mail If you have any lab test that is abnormal or we need to change your treatment, we will call you to review the results.   Testing/Procedures:  Your physician has requested that you have an echocardiogram. Echocardiography is a painless test that uses sound waves to create images of your heart. It provides your doctor with information about the size and shape of your heart and how well your heart's chambers and valves are working. This procedure takes approximately one hour. There are no restrictions for this procedure. Please do NOT wear cologne, perfume, aftershave, or lotions (deodorant is allowed). Please arrive 15 minutes prior to your appointment time.    Follow-Up: At [facility], you and your health needs are our priority.  As part of our continuing mission to provide you with exceptional heart care, we have created designated Provider Care Teams.  These Care Teams include your primary Cardiologist (physician) and Advanced Practice Providers (APPs -  Physician Assistants and Nurse Practitioners) who all work together to provide you with the care you need, when you need it.  We recommend signing up for the patient portal called "MyChart".  Sign up information is provided on this After Visit Summary.  MyChart is used to connect with patients for Virtual Visits (Telemedicine).  Patients are able to view lab/test results, encounter notes, upcoming appointments, etc.  Non-urgent messages can be sent to your provider as well.   To learn more about what you can do with MyChart, go to [email or web address].    Your next appointment:    After  Echocardiogram  Provider:   You may see Debbe Odea, MD or one of the following Advanced Practice Providers on your designated Care Team:   Nicolasa Ducking, NP Eula Listen, PA-C Cadence Fransico Michael, PA-C Charlsie Quest, NP   Signed, Debbe Odea, MD  05/02/2023 2:38 PM    [facility]

## 2023-05-07 ENCOUNTER — Ambulatory Visit: Payer: Medicare HMO | Admitting: Urology

## 2023-05-07 ENCOUNTER — Encounter: Payer: Self-pay | Admitting: Urology

## 2023-05-07 VITALS — BP 108/67 | HR 56

## 2023-05-07 DIAGNOSIS — R251 Tremor, unspecified: Secondary | ICD-10-CM | POA: Diagnosis not present

## 2023-05-07 DIAGNOSIS — Z09 Encounter for follow-up examination after completed treatment for conditions other than malignant neoplasm: Secondary | ICD-10-CM

## 2023-05-07 DIAGNOSIS — R413 Other amnesia: Secondary | ICD-10-CM | POA: Diagnosis not present

## 2023-05-07 DIAGNOSIS — N3020 Other chronic cystitis without hematuria: Secondary | ICD-10-CM

## 2023-05-07 DIAGNOSIS — M545 Low back pain, unspecified: Secondary | ICD-10-CM | POA: Diagnosis not present

## 2023-05-07 DIAGNOSIS — M542 Cervicalgia: Secondary | ICD-10-CM | POA: Diagnosis not present

## 2023-05-07 DIAGNOSIS — R29898 Other symptoms and signs involving the musculoskeletal system: Secondary | ICD-10-CM | POA: Diagnosis not present

## 2023-05-07 DIAGNOSIS — Z87440 Personal history of urinary (tract) infections: Secondary | ICD-10-CM | POA: Diagnosis not present

## 2023-05-07 DIAGNOSIS — N390 Urinary tract infection, site not specified: Secondary | ICD-10-CM | POA: Diagnosis not present

## 2023-05-07 DIAGNOSIS — F32A Depression, unspecified: Secondary | ICD-10-CM | POA: Diagnosis not present

## 2023-05-07 DIAGNOSIS — M25561 Pain in right knee: Secondary | ICD-10-CM | POA: Diagnosis not present

## 2023-05-07 MED ORDER — NITROFURANTOIN MONOHYD MACRO 100 MG PO CAPS
100.0000 mg | ORAL_CAPSULE | Freq: Every day | ORAL | 3 refills | Status: DC
Start: 2023-05-07 — End: 2023-10-03

## 2023-05-07 MED ORDER — MIRABEGRON ER 50 MG PO TB24
50.0000 mg | ORAL_TABLET | Freq: Every day | ORAL | 11 refills | Status: DC
Start: 2023-05-07 — End: 2024-06-24

## 2023-05-07 NOTE — Progress Notes (Signed)
05/07/2023 1:20 PM   Wanda Martinez 1935-05-12 [id number]  Referring provider: Dale [place], MD [address] [place],  [place] [address]  Chief Complaint  Patient presents with   Follow-up   Cystitis    HPI: I was consulted to assess the patient's recurrent bladder infections.  She gets frequency and burning that usually respond to antibiotics.  She gets 6 bladder infections a year.  She has had some positive negative cultures on review of medical records  When she is not infected she voids every 1-2 hours and gets up 3 times a night.  She has urge incontinence especially if she holds it too long.  She can might have mild stress incontinence.  She has moderate bedwetting  She has had a hysterectomy     Pathophysiology of chronic cystitis discussed.  Role of prophylaxis discussed.  Reassess for pelvic semination cystoscopy in 6 weeks with cystoscopy.  Call if ultrasound abnormal.  Call if urine culture abnormal.  Role of probiotics discussed.  See if mixed incontinence down regulate   Macrobid 100 mg 3x11 sent.   Today Frequency stable.  No hydronephrosis on ultrasound or stones.  Last culture negative.  No infection.  Clinically doing well   Normal cysto   Clinically doing well on urinary prophylaxis.  Reassess in 4 months.   Today Frequency stable.  She may have had 1 breakthrough when she had steroids for her back.  Otherwise does well.  Infection free today.   Today Infection free on Macrodantin.  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  Clinically otherwise not infected  Renew Macrodantin. Reassess in 6 weeks on Myrbetriq 50 mg samples and prescription. Call if culture positive    Today Patient's foot on the floor syndrome 50% better.  She went to the hospital with a lot of medical issues and dehydration and I think chronic renal insufficiency by do not think had a positive  culture.  I looked through the medical records in [place].  She does not think she had an infection.  Clinically infection free on daily Macrodantin.  Still on Myrbetriq samples    Send urine for culture. I gave 8 more weeks of samples. I did not have Gemtesa to try today and I want to try without co-pay at this stage. Reassess in 8 weeks. Call if culture positive. She is improved at least 50%   Today  Infection free on daily Macrodantin.  Send urine for culture Overall doing well on Myrbetriq with little bit of foot on the floor syndrome.  She wants to stay on it.  Frequency stable   PMH: Past Medical History:  Diagnosis Date   Acute postoperative pain 10/25/2017   Anemia    Arm pain 07/26/2015   Arthritis    Arthritis, degenerative 03/26/2014   Back pain 11/01/2013   Bladder infection 06/2018   Breast cancer (HCC)    Masectomy - left - 1986    Breast cancer [facility])    Mastectomy-right -2014   CHEST PAIN 04/29/2010   Qualifier: Diagnosis of  By: Laural Benes RN, Erika     Chronic cystitis    Cystocele 02/22/2013   Cystocele, midline 08/19/2013   Degeneration of intervertebral disc of lumbosacral region 03/26/2014   DYSPNEA 04/29/2010   Qualifier: Diagnosis of  By: Laural Benes RN, Erika     Enthesopathy of hip 03/26/2014   GERD (gastroesophageal reflux disease)  Hiatal hernia    HTN (hypertension)    Hypokalemia 06/2018   Hyponatremia 06/2018   LBP (low back pain) 03/26/2014   Neck pain 11/01/2013   Parkinson disease    Pneumonia 06/2018   Sinusitis 02/07/2015   Skin lesions 07/12/2014   Urinary incontinence    mixed     Surgical History: Past Surgical History:  Procedure Laterality Date   ABDOMINAL HYSTERECTOMY     BLEPHAROPLASTY     BREAST BIOPSY  2013   BREAST ENHANCEMENT SURGERY  1987   BREAST IMPLANT REMOVAL     BREAST IMPLANT REMOVAL Right 08/29/2012   BREAST SURGERY  1986   s/p left mastectomy   COLONOSCOPY WITH PROPOFOL N/A 09/13/2016   Procedure: COLONOSCOPY WITH PROPOFOL;   Surgeon: Scot Jun, MD;  [facility] ENDOSCOPY;  Service: Endoscopy;  Laterality: N/A;   MASTECTOMY  08/2012   right   TONSILLECTOMY AND ADENOIDECTOMY  1979   VESICOVAGINAL FISTULA CLOSURE W/ TAH  1983    Home Medications:  Allergies as of 05/07/2023       Reactions   Sulfa Antibiotics Nausea Only   Vesicare [solifenacin] Other (See Comments)   Constipation Constipation        Medication List        Accurate as of May 07, 2023  1:20 PM. If you have any questions, ask your nurse or doctor.          amLODipine 2.5 MG tablet Commonly known as: NORVASC Take 1 tablet (2.5 mg total) by mouth daily.   aspirin EC 81 MG tablet Take 81 mg by mouth daily as needed.   calcium carbonate 1500 (600 Ca) MG Tabs tablet Commonly known as: OSCAL Take 1 tablet by mouth daily.   citalopram 20 MG tablet Commonly known as: CELEXA TAKE 1 TABLET BY MOUTH ONCE DAILY.   cyclobenzaprine 10 MG tablet Commonly known as: FLEXERIL TAKE ONE TABLET BY MOUTH AT BEDTIME.   donepezil 10 MG tablet Commonly known as: ARICEPT TAKE ONE TABLET BY MOUTH AT BEDTIME   erythromycin ophthalmic ointment Apply to eye.   gabapentin 300 MG capsule Commonly known as: NEURONTIN Take 1 capsule (300 mg total) by mouth at bedtime.   GLUCOSAMINE SULFATE PO Take by mouth daily.   Glucosamine-Chondroitin 500-400 MG Caps Take 2 capsules by mouth daily.   HYDROcodone-acetaminophen 5-325 MG tablet Commonly known as: NORCO/VICODIN Take 1 tablet by mouth 3 (three) times daily. Must last 30 days.   HYDROcodone-acetaminophen 5-325 MG tablet Commonly known as: NORCO/VICODIN Take 1 tablet by mouth 3 (three) times daily. Must last 30 days.   HYDROcodone-acetaminophen 5-325 MG tablet Commonly known as: NORCO/VICODIN Take 1 tablet by mouth 3 (three) times daily. Must last 30 days. Start taking on: May 29, 2023   hydrocortisone 25 MG suppository Commonly known as: ANUSOL-HC Place 1 suppository (25  mg total) rectally 2 (two) times daily.   letrozole 2.5 MG tablet Commonly known as: FEMARA Take 2.5 mg by mouth daily.   losartan 50 MG tablet Commonly known as: COZAAR TAKE (2) TABLETS BY MOUTH ONCE DAILY.   lubiprostone 24 MCG capsule Commonly known as: AMITIZA TAKE 1 CAPSULE BY MOUTH ONCE DAILY WITH BREAKFAST.   magnesium oxide 400 (240 Mg) MG tablet Commonly known as: MAG-OX TAKE 1 TABLET BY MOUTH ONCE DAILY.   melatonin 1 MG Tabs tablet Take 3 mg by mouth at bedtime.   metoprolol succinate 25 MG 24 hr tablet Commonly known as: TOPROL-XL Take 1 tablet (25  mg total) by mouth 2 (two) times daily.   mirabegron ER 50 MG Tb24 tablet Commonly known as: MYRBETRIQ Take 1 tablet (50 mg total) by mouth daily.   multivitamin tablet Take 1 tablet by mouth daily.   naloxone 4 MG/0.1ML Liqd nasal spray kit Commonly known as: NARCAN Place 1 spray into the nose as needed for up to 365 doses (for opioid-induced respiratory depresssion). In case of emergency (overdose), spray once into each nostril. If no response within 3 minutes, repeat application and call 911.   nitrofurantoin (macrocrystal-monohydrate) 100 MG capsule Commonly known as: MACROBID Take 1 capsule (100 mg total) by mouth daily.   Omega-3 1000 MG Caps Take by mouth.   pantoprazole 40 MG tablet Commonly known as: PROTONIX Take 1 tablet (40 mg total) by mouth daily.   rosuvastatin 20 MG tablet Commonly known as: CRESTOR TAKE 1 TABLET BY MOUTH ONCE DAILY.        Allergies:  Allergies  Allergen Reactions   Sulfa Antibiotics Nausea Only   Vesicare [Solifenacin] Other (See Comments)    Constipation Constipation    Family History: Family History  Problem Relation Age of Onset   Diabetes Father    Stroke Father    Colon polyps Father    Stroke Mother    Parkinson's disease Mother     Social History:  reports that she has never smoked. She has never been exposed to tobacco smoke. She has never used  smokeless tobacco. She reports that she does not drink alcohol and does not use drugs.  ROS:                                        Physical Exam: BP 108/67   Pulse (!) 56   LMP 12/18/1981   Constitutional:  Alert and oriented, No acute distress. HEENT: [place] AT, moist mucus membranes.  Trachea midline, no masses.   Laboratory Data: Lab Results  Component Value Date   WBC 6.3 04/16/2023   HGB 10.5 (L) 04/16/2023   HCT 32.9 (L) 04/16/2023   MCV 95.9 04/16/2023   PLT 237 04/16/2023    Lab Results  Component Value Date   CREATININE 1.05 04/09/2023    No results found for: "PSA"  No results found for: "TESTOSTERONE"  Lab Results  Component Value Date   HGBA1C 6.6 (H) 01/31/2023    Urinalysis    Component Value Date/Time   COLORURINE YELLOW 03/17/2022 1009   APPEARANCEUR Hazy (A) 03/12/2023 1416   LABSPEC 1.010 03/17/2022 1009   LABSPEC 1.006 10/08/2014 1745   PHURINE 6.5 03/17/2022 1009   GLUCOSEU Negative 03/12/2023 1416   GLUCOSEU NEGATIVE 03/17/2022 1009   HGBUR NEGATIVE 03/17/2022 1009   BILIRUBINUR Negative 03/12/2023 1416   BILIRUBINUR Negative 10/08/2014 1745   KETONESUR NEGATIVE 03/17/2022 1009   PROTEINUR 3+ (A) 03/12/2023 1416   PROTEINUR NEGATIVE 10/17/2019 1608   UROBILINOGEN 0.2 03/17/2022 1009   NITRITE Negative 03/12/2023 1416   NITRITE NEGATIVE 03/17/2022 1009   LEUKOCYTESUR Negative 03/12/2023 1416   LEUKOCYTESUR TRACE (A) 03/17/2022 1009   LEUKOCYTESUR Trace 10/08/2014 1745    Pertinent Imaging:   Assessment & Plan: Prescription sent and I will see near  1. Chronic cystitis   2. Recurrent UTI  - Urinalysis, Complete   No follow-ups on file.  Martina Sinner, MD  [facility] [address], [address] [place], [place] [address] [phone]  [phone]  

## 2023-05-08 DIAGNOSIS — N1832 Chronic kidney disease, stage 3b: Secondary | ICD-10-CM | POA: Diagnosis not present

## 2023-05-08 DIAGNOSIS — R8290 Unspecified abnormal findings in urine: Secondary | ICD-10-CM | POA: Diagnosis not present

## 2023-05-08 DIAGNOSIS — E876 Hypokalemia: Secondary | ICD-10-CM | POA: Diagnosis not present

## 2023-05-08 DIAGNOSIS — I10 Essential (primary) hypertension: Secondary | ICD-10-CM | POA: Diagnosis not present

## 2023-05-08 DIAGNOSIS — N39498 Other specified urinary incontinence: Secondary | ICD-10-CM | POA: Diagnosis not present

## 2023-05-08 DIAGNOSIS — M25561 Pain in right knee: Secondary | ICD-10-CM | POA: Diagnosis not present

## 2023-05-08 DIAGNOSIS — M1711 Unilateral primary osteoarthritis, right knee: Secondary | ICD-10-CM | POA: Diagnosis not present

## 2023-05-08 DIAGNOSIS — E871 Hypo-osmolality and hyponatremia: Secondary | ICD-10-CM | POA: Diagnosis not present

## 2023-05-08 DIAGNOSIS — E785 Hyperlipidemia, unspecified: Secondary | ICD-10-CM | POA: Diagnosis not present

## 2023-05-08 DIAGNOSIS — K219 Gastro-esophageal reflux disease without esophagitis: Secondary | ICD-10-CM | POA: Diagnosis not present

## 2023-05-08 DIAGNOSIS — M549 Dorsalgia, unspecified: Secondary | ICD-10-CM | POA: Diagnosis not present

## 2023-05-08 LAB — MICROSCOPIC EXAMINATION

## 2023-05-08 LAB — URINALYSIS, COMPLETE
Bilirubin, UA: NEGATIVE
Glucose, UA: NEGATIVE
Ketones, UA: NEGATIVE
Nitrite, UA: NEGATIVE
RBC, UA: NEGATIVE
Specific Gravity, UA: 1.01 (ref 1.005–1.030)
Urobilinogen, Ur: 1 mg/dL (ref 0.2–1.0)
pH, UA: 5.5 (ref 5.0–7.5)

## 2023-05-09 ENCOUNTER — Telehealth: Payer: Self-pay

## 2023-05-09 LAB — CULTURE, URINE COMPREHENSIVE

## 2023-05-09 NOTE — Telephone Encounter (Signed)
For bilateral TFESI Medicare will only allow one level.  You requested 2 levels bilaterally. So I told him that they could take out a level. I guess next time just request one level bilateral or 2 levels for one side.  They came back and denied it anyway. No PT. I told them the patient is unable to do PT due to age and condition. Still denied.

## 2023-05-10 LAB — CULTURE, URINE COMPREHENSIVE

## 2023-05-11 ENCOUNTER — Other Ambulatory Visit: Payer: Self-pay | Admitting: *Deleted

## 2023-05-11 DIAGNOSIS — C50211 Malignant neoplasm of upper-inner quadrant of right female breast: Secondary | ICD-10-CM

## 2023-05-15 ENCOUNTER — Inpatient Hospital Stay: Payer: Medicare HMO

## 2023-05-15 ENCOUNTER — Inpatient Hospital Stay (HOSPITAL_BASED_OUTPATIENT_CLINIC_OR_DEPARTMENT_OTHER): Payer: Medicare HMO | Admitting: Oncology

## 2023-05-15 ENCOUNTER — Inpatient Hospital Stay: Payer: Medicare HMO | Attending: Oncology

## 2023-05-15 ENCOUNTER — Encounter: Payer: Self-pay | Admitting: Oncology

## 2023-05-15 VITALS — BP 101/58 | HR 51 | Temp 99.0°F | Resp 14 | Ht 64.0 in | Wt 180.0 lb

## 2023-05-15 DIAGNOSIS — E785 Hyperlipidemia, unspecified: Secondary | ICD-10-CM | POA: Diagnosis not present

## 2023-05-15 DIAGNOSIS — N186 End stage renal disease: Secondary | ICD-10-CM | POA: Insufficient documentation

## 2023-05-15 DIAGNOSIS — N39498 Other specified urinary incontinence: Secondary | ICD-10-CM | POA: Diagnosis not present

## 2023-05-15 DIAGNOSIS — D631 Anemia in chronic kidney disease: Secondary | ICD-10-CM | POA: Insufficient documentation

## 2023-05-15 DIAGNOSIS — R8290 Unspecified abnormal findings in urine: Secondary | ICD-10-CM | POA: Diagnosis not present

## 2023-05-15 DIAGNOSIS — E876 Hypokalemia: Secondary | ICD-10-CM | POA: Diagnosis not present

## 2023-05-15 DIAGNOSIS — M810 Age-related osteoporosis without current pathological fracture: Secondary | ICD-10-CM

## 2023-05-15 DIAGNOSIS — I10 Essential (primary) hypertension: Secondary | ICD-10-CM | POA: Diagnosis not present

## 2023-05-15 DIAGNOSIS — N1832 Chronic kidney disease, stage 3b: Secondary | ICD-10-CM | POA: Diagnosis not present

## 2023-05-15 DIAGNOSIS — C50211 Malignant neoplasm of upper-inner quadrant of right female breast: Secondary | ICD-10-CM

## 2023-05-15 DIAGNOSIS — G20A1 Parkinson's disease without dyskinesia, without mention of fluctuations: Secondary | ICD-10-CM | POA: Diagnosis not present

## 2023-05-15 DIAGNOSIS — K219 Gastro-esophageal reflux disease without esophagitis: Secondary | ICD-10-CM | POA: Diagnosis not present

## 2023-05-15 LAB — CBC WITH DIFFERENTIAL/PLATELET
Abs Immature Granulocytes: 0.02 10*3/uL (ref 0.00–0.07)
Basophils Absolute: 0.1 10*3/uL (ref 0.0–0.1)
Basophils Relative: 1 %
Eosinophils Absolute: 0.2 10*3/uL (ref 0.0–0.5)
Eosinophils Relative: 5 %
HCT: 30.7 % — ABNORMAL LOW (ref 36.0–46.0)
Hemoglobin: 9.9 g/dL — ABNORMAL LOW (ref 12.0–15.0)
Immature Granulocytes: 0 %
Lymphocytes Relative: 24 %
Lymphs Abs: 1.1 10*3/uL (ref 0.7–4.0)
MCH: 30.5 pg (ref 26.0–34.0)
MCHC: 32.2 g/dL (ref 30.0–36.0)
MCV: 94.5 fL (ref 80.0–100.0)
Monocytes Absolute: 0.5 10*3/uL (ref 0.1–1.0)
Monocytes Relative: 12 %
Neutro Abs: 2.7 10*3/uL (ref 1.7–7.7)
Neutrophils Relative %: 58 %
Platelets: 210 10*3/uL (ref 150–400)
RBC: 3.25 MIL/uL — ABNORMAL LOW (ref 3.87–5.11)
RDW: 14.2 % (ref 11.5–15.5)
WBC: 4.6 10*3/uL (ref 4.0–10.5)
nRBC: 0 % (ref 0.0–0.2)

## 2023-05-15 LAB — BASIC METABOLIC PANEL - CANCER CENTER ONLY
Anion gap: 10 (ref 5–15)
BUN: 35 mg/dL — ABNORMAL HIGH (ref 8–23)
CO2: 28 mmol/L (ref 22–32)
Calcium: 9.3 mg/dL (ref 8.9–10.3)
Chloride: 95 mmol/L — ABNORMAL LOW (ref 98–111)
Creatinine: 1.44 mg/dL — ABNORMAL HIGH (ref 0.44–1.00)
GFR, Estimated: 36 mL/min — ABNORMAL LOW (ref >60)
Glucose, Bld: 88 mg/dL (ref 70–99)
Potassium: 3.5 mmol/L (ref 3.5–5.1)
Sodium: 133 mmol/L — ABNORMAL LOW (ref 135–145)

## 2023-05-15 MED ORDER — EPOETIN ALFA-EPBX 40000 UNIT/ML IJ SOLN
40000.0000 [IU] | Freq: Once | INTRAMUSCULAR | Status: AC
  Administered 2023-05-15: 40000 [IU] via SUBCUTANEOUS
  Filled 2023-05-15: qty 1

## 2023-05-15 NOTE — Progress Notes (Signed)
[facility]  [phone] [phone]  ID: Raelyn Number OB: 27-Aug-1935  MR#: [id number]  IHK#:[id number]  Patient Care Team: Dale DeForest, MD as PCP - General (Internal Medicine) Debbe Odea, MD as PCP - Cardiology (Cardiology) [name], MD as Consulting Physician (Pain Medicine)  CHIEF COMPLAINT: History of breast cancer, anemia unspecified, osteoporosis.  INTERVAL HISTORY: Patient returns to clinic today for repeat laboratory work, further evaluation, and continuation of Retacrit.  Her weakness and fatigue have improved, but she continues to have right knee and lumbar back pain.  She otherwise feels well.  She has no new neurologic complaints.  She denies any recent fevers or illnesses.  She has a good appetite and denies weight loss.  She denies any chest pain, shortness of breath, cough, or hemoptysis.  She denies any nausea, vomiting, constipation, or diarrhea.  She has no urinary complaints.  Patient offers no further specific complaints today.  REVIEW OF SYSTEMS:   Review of Systems  Constitutional: Negative.  Negative for fever, malaise/fatigue and weight loss.  Respiratory: Negative.  Negative for cough and shortness of breath.   Cardiovascular: Negative.  Negative for chest pain and leg swelling.  Gastrointestinal: Negative.  Negative for abdominal pain.  Genitourinary: Negative.   Musculoskeletal:  Positive for back pain and joint pain.  Skin: Negative.  Negative for rash.  Neurological:  Positive for tremors. Negative for sensory change, speech change, focal weakness and weakness.  Psychiatric/Behavioral: Negative.  Negative for depression. The patient is not nervous/anxious.     As per HPI. Otherwise, a complete review of systems is negative.  PAST MEDICAL HISTORY: Past Medical History:  Diagnosis Date   Acute postoperative pain 10/25/2017   Anemia    Arm pain 07/26/2015   Arthritis    Arthritis,  degenerative 03/26/2014   Back pain 11/01/2013   Bladder infection 06/2018   Breast cancer (HCC)    Masectomy - left - 1986    Breast cancer [facility])    Mastectomy-right -2014   CHEST PAIN 04/29/2010   Qualifier: Diagnosis of  By: Laural Benes RN, Erika     Chronic cystitis    Cystocele 02/22/2013   Cystocele, midline 08/19/2013   Degeneration of intervertebral disc of lumbosacral region 03/26/2014   DYSPNEA 04/29/2010   Qualifier: Diagnosis of  By: Laural Benes RN, Erika     Enthesopathy of hip 03/26/2014   GERD (gastroesophageal reflux disease)    Hiatal hernia    HTN (hypertension)    Hypokalemia 06/2018   Hyponatremia 06/2018   LBP (low back pain) 03/26/2014   Neck pain 11/01/2013   Parkinson disease    Pneumonia 06/2018   Sinusitis 02/07/2015   Skin lesions 07/12/2014   Urinary incontinence    mixed     PAST SURGICAL HISTORY: Past Surgical History:  Procedure Laterality Date   ABDOMINAL HYSTERECTOMY     BLEPHAROPLASTY     BREAST BIOPSY  2013   BREAST ENHANCEMENT SURGERY  1987   BREAST IMPLANT REMOVAL     BREAST IMPLANT REMOVAL Right 08/29/2012   BREAST SURGERY  1986   s/p left mastectomy   COLONOSCOPY WITH PROPOFOL N/A 09/13/2016   Procedure: COLONOSCOPY WITH PROPOFOL;  Surgeon: Scot Jun, MD;  Location: [facility] ENDOSCOPY;  Service: Endoscopy;  Laterality: N/A;   MASTECTOMY  08/2012   right   TONSILLECTOMY AND ADENOIDECTOMY  1979   VESICOVAGINAL FISTULA CLOSURE W/ TAH  1983    FAMILY HISTORY Family History  Problem Relation Age of Onset  Diabetes Father    Stroke Father    Colon polyps Father    Stroke Mother    Parkinson's disease Mother        ADVANCED DIRECTIVES:    HEALTH MAINTENANCE: Social History   Tobacco Use   Smoking status: Never    Passive exposure: Never   Smokeless tobacco: Never   Tobacco comments:    tobacco use - no  Vaping Use   Vaping Use: Never used  Substance Use Topics   Alcohol use: No    Alcohol/week: 0.0 standard drinks of alcohol    Drug use: No     Allergies  Allergen Reactions   Sulfa Antibiotics Nausea Only   Vesicare [Solifenacin] Other (See Comments)    Constipation Constipation    Current Outpatient Medications  Medication Sig Dispense Refill   amLODipine (NORVASC) 2.5 MG tablet Take 1 tablet (2.5 mg total) by mouth daily. 30 tablet 0   aspirin 81 MG EC tablet Take 81 mg by mouth daily as needed.     calcium carbonate (OSCAL) 1500 (600 Ca) MG TABS tablet Take 1 tablet by mouth daily.     citalopram (CELEXA) 20 MG tablet TAKE 1 TABLET BY MOUTH ONCE DAILY. 30 tablet 11   cyclobenzaprine (FLEXERIL) 10 MG tablet TAKE ONE TABLET BY MOUTH AT BEDTIME. 30 tablet 11   donepezil (ARICEPT) 10 MG tablet TAKE ONE TABLET BY MOUTH AT BEDTIME 30 tablet 11   erythromycin ophthalmic ointment Apply to eye.     gabapentin (NEURONTIN) 300 MG capsule Take 1 capsule (300 mg total) by mouth at bedtime. 90 capsule 0   Glucosamine-Chondroitin 500-400 MG CAPS Take 2 capsules by mouth daily.     HYDROcodone-acetaminophen (NORCO/VICODIN) 5-325 MG tablet Take 1 tablet by mouth 3 (three) times daily. Must last 30 days. 90 tablet 0   [START ON 05/29/2023] HYDROcodone-acetaminophen (NORCO/VICODIN) 5-325 MG tablet Take 1 tablet by mouth 3 (three) times daily. Must last 30 days. 90 tablet 0   hydrocortisone (ANUSOL-HC) 25 MG suppository Place 1 suppository (25 mg total) rectally 2 (two) times daily. 12 suppository 0   letrozole (FEMARA) 2.5 MG tablet Take 2.5 mg by mouth daily.     losartan (COZAAR) 50 MG tablet TAKE (2) TABLETS BY MOUTH ONCE DAILY. 60 tablet 11   lubiprostone (AMITIZA) 24 MCG capsule TAKE 1 CAPSULE BY MOUTH ONCE DAILY WITH BREAKFAST.     magnesium oxide (MAG-OX) 400 (240 Mg) MG tablet TAKE 1 TABLET BY MOUTH ONCE DAILY. 30 tablet 11   melatonin 1 MG TABS tablet Take 3 mg by mouth at bedtime.     metoprolol succinate (TOPROL-XL) 25 MG 24 hr tablet Take 1 tablet (25 mg total) by mouth 2 (two) times daily. 180 tablet 1    mirabegron ER (MYRBETRIQ) 50 MG TB24 tablet Take 1 tablet (50 mg total) by mouth daily. 30 tablet 11   Multiple Vitamin (MULTIVITAMIN) tablet Take 1 tablet by mouth daily.     naloxone (NARCAN) nasal spray 4 mg/0.1 mL Place 1 spray into the nose as needed for up to 365 doses (for opioid-induced respiratory depresssion). In case of emergency (overdose), spray once into each nostril. If no response within 3 minutes, repeat application and call 911. 1 each 0   nitrofurantoin, macrocrystal-monohydrate, (MACROBID) 100 MG capsule Take 1 capsule (100 mg total) by mouth daily. 90 capsule 3   Omega-3 1000 MG CAPS Take by mouth.     pantoprazole (PROTONIX) 40 MG tablet Take 1 tablet (40  mg total) by mouth daily. 30 tablet 11   rosuvastatin (CRESTOR) 20 MG tablet TAKE 1 TABLET BY MOUTH ONCE DAILY. 30 tablet 11   GLUCOSAMINE SULFATE PO Take by mouth daily. (Patient not taking: Reported on 05/02/2023)     HYDROcodone-acetaminophen (NORCO/VICODIN) 5-325 MG tablet Take 1 tablet by mouth 3 (three) times daily. Must last 30 days. 90 tablet 0   No current facility-administered medications for this visit.   Facility-Administered Medications Ordered in Other Visits  Medication Dose Route Frequency Provider Last Rate Last Admin   epoetin alfa-epbx (RETACRIT) injection 40,000 Units  40,000 Units Subcutaneous Once Jeralyn Ruths, MD        OBJECTIVE: Vitals:   05/15/23 1000  BP: (!) 101/58  Pulse: (!) 51  Resp: 14  Temp: 99 F (37.2 C)  SpO2: 97%     Body mass index is 30.9 kg/m.    ECOG FS:1 - Symptomatic but completely ambulatory  General: Well-developed, well-nourished, no acute distress. Eyes: Pink conjunctiva, anicteric sclera. HEENT: Normocephalic, moist mucous membranes. Lungs: No audible wheezing or coughing. Heart: Regular rate and rhythm. Abdomen: Soft, nontender, no obvious distention. Musculoskeletal: No edema, cyanosis, or clubbing. Neuro: Alert, answering all questions appropriately.  Cranial nerves grossly intact. Skin: No rashes or petechiae noted. Psych: Normal affect.  LAB RESULTS:  Lab Results  Component Value Date   NA 133 (L) 05/15/2023   K 3.5 05/15/2023   CL 95 (L) 05/15/2023   CO2 28 05/15/2023   GLUCOSE 88 05/15/2023   BUN 35 (H) 05/15/2023   CREATININE 1.44 (H) 05/15/2023   CALCIUM 9.3 05/15/2023   PROT 6.7 04/09/2023   ALBUMIN 4.1 04/09/2023   AST 26 04/09/2023   ALT 18 04/09/2023   ALKPHOS 30 (L) 04/09/2023   BILITOT 0.4 04/09/2023   GFRNONAA 36 (L) 05/15/2023   GFRAA >60 07/27/2020    Lab Results  Component Value Date   WBC 4.6 05/15/2023   NEUTROABS 2.7 05/15/2023   HGB 9.9 (L) 05/15/2023   HCT 30.7 (L) 05/15/2023   MCV 94.5 05/15/2023   PLT 210 05/15/2023     STUDIES: No results found.  ASSESSMENT: History of breast cancer, anemia unspecified, osteoporosis.  PLAN:    Anemia: Patient's hemoglobin has improved to 9.9.  Previously, her iron stores all of her other laboratory work including iron stores were either negative or within normal limits.  Possibly related to her underlying chronic renal insufficiency.  Proceed with Retacrit today.  It appears patient only requires treatment every 6 to 8 weeks.  Return to clinic in 6 weeks for laboratory work and continuation of Retacrit if her hemoglobin remains below 10.0.  Patient will then return to clinic in 3 months with repeat laboratory work, further evaluation, and continuation of treatment if needed.    Osteoporosis: Patient's most recent bone mineral density on July 05, 2022 reported T score of -2.7 which is essentially unchanged from previous where her T score was -2.6 and -2.9 respectively.  Patient previously received Prolia, but this has been switched to Zometa secondary to insurance purposes.  Zometa is dose reduced secondary to her renal insufficiency.  Continue calcium and vitamin D supplementation.  Patient last received Zometa in March 2024.  Return to clinic in 3 months as  above for both Retacrit and Zometa.   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  She completed 5 years of letrozole in  May 2019.  No further interventions are needed.  Patient has had bilateral mastectomies, therefore no further mammograms are necessary.  Back/joint pain: Chronic and unchanged.  Continue symptomatic treatment with pain clinic.   Parkinson's: Chronic and unchanged.  Continue follow-up with neurology.   Patient expressed understanding and was in agreement with this plan. She also understands that She can call clinic at any time with any questions, concerns, or complaints.   Breast cancer [facility])   Staging form: Breast, AJCC 7th Edition     Clinical stage from 09/22/2015: Stage IIA (T1c, N1, M0) - Signed by Jeralyn Ruths, MD on 09/22/2015   Jeralyn Ruths, MD   05/15/2023 10:42 AM

## 2023-05-16 ENCOUNTER — Other Ambulatory Visit: Payer: Self-pay | Admitting: Internal Medicine

## 2023-05-18 DIAGNOSIS — M1711 Unilateral primary osteoarthritis, right knee: Secondary | ICD-10-CM | POA: Diagnosis not present

## 2023-05-21 ENCOUNTER — Ambulatory Visit (INDEPENDENT_AMBULATORY_CARE_PROVIDER_SITE_OTHER): Payer: Medicare HMO | Admitting: Internal Medicine

## 2023-05-21 VITALS — BP 122/68 | HR 60 | Temp 98.3°F | Resp 16 | Ht 64.0 in | Wt 182.2 lb

## 2023-05-21 DIAGNOSIS — F320 Major depressive disorder, single episode, mild: Secondary | ICD-10-CM

## 2023-05-21 DIAGNOSIS — I10 Essential (primary) hypertension: Secondary | ICD-10-CM

## 2023-05-21 DIAGNOSIS — I700 Atherosclerosis of aorta: Secondary | ICD-10-CM

## 2023-05-21 DIAGNOSIS — N1831 Chronic kidney disease, stage 3a: Secondary | ICD-10-CM

## 2023-05-21 DIAGNOSIS — G20A1 Parkinson's disease without dyskinesia, without mention of fluctuations: Secondary | ICD-10-CM

## 2023-05-21 DIAGNOSIS — Z853 Personal history of malignant neoplasm of breast: Secondary | ICD-10-CM

## 2023-05-21 DIAGNOSIS — I471 Supraventricular tachycardia, unspecified: Secondary | ICD-10-CM

## 2023-05-21 DIAGNOSIS — E7800 Pure hypercholesterolemia, unspecified: Secondary | ICD-10-CM

## 2023-05-21 DIAGNOSIS — D649 Anemia, unspecified: Secondary | ICD-10-CM | POA: Diagnosis not present

## 2023-05-21 DIAGNOSIS — R251 Tremor, unspecified: Secondary | ICD-10-CM

## 2023-05-21 DIAGNOSIS — K219 Gastro-esophageal reflux disease without esophagitis: Secondary | ICD-10-CM

## 2023-05-21 DIAGNOSIS — R739 Hyperglycemia, unspecified: Secondary | ICD-10-CM

## 2023-05-21 DIAGNOSIS — M810 Age-related osteoporosis without current pathological fracture: Secondary | ICD-10-CM

## 2023-05-21 NOTE — Progress Notes (Signed)
Subjective:    Patient ID: Wanda Martinez, female    DOB: 1935-05-29, 88 y.o.   MRN: [id number]  Patient here for  Chief Complaint  Patient presents with   Medical Management of Chronic Issues    HPI Here for follow up regarding hypertension- on amlodipine and losartan. (Hctz and metoprolol stopped during recent hospitalization). Last visit, was started back on metoprolol for better blood pressure control.  Was also having issues with increased tremors.  Saw cardiology 05/02/23 - recommended continuing toprol.  Also recommended echo. Saw urology 05/07/23 - cysto normal.  On macrobid prophylaxis.  Trial of myrbetriq.  Had f/u with Dr Malvin Johns 05/07/23 - recommended continuing gabapentin.  Recommended restarting baclofen for neck and arm pain.  Also increased citalopram.  Referred to ortho for her knee. Recommended therapy.  Received orthovisc injection has helped some. 05/18/23. . Saw Dr Orlie Dakin - f/u anemoa.  Retacrit - 05/15/23.  Also on zometa for osteoporosis - last 02/2023. Overall feeling better.  Eating.  No vomiting or abdominal pain reported.  Breathing stable.    Past Medical History:  Diagnosis Date   Acute postoperative pain 10/25/2017   Anemia    Arm pain 07/26/2015   Arthritis    Arthritis, degenerative 03/26/2014   Back pain 11/01/2013   Bladder infection 06/2018   Breast cancer (HCC)    Masectomy - left - 1986    Breast cancer [facility])    Mastectomy-right -2014   CHEST PAIN 04/29/2010   Qualifier: Diagnosis of  By: Laural Benes RN, Erika     Chronic cystitis    Cystocele 02/22/2013   Cystocele, midline 08/19/2013   Degeneration of intervertebral disc of lumbosacral region 03/26/2014   DYSPNEA 04/29/2010   Qualifier: Diagnosis of  By: Laural Benes RN, Erika     Enthesopathy of hip 03/26/2014   GERD (gastroesophageal reflux disease)    Hiatal hernia    HTN (hypertension)    Hypokalemia 06/2018   Hyponatremia 06/2018   LBP (low back pain) 03/26/2014   Neck pain 11/01/2013   Parkinson  disease    Pneumonia 06/2018   Sinusitis 02/07/2015   Skin lesions 07/12/2014   Urinary incontinence    mixed    Past Surgical History:  Procedure Laterality Date   ABDOMINAL HYSTERECTOMY     BLEPHAROPLASTY     BREAST BIOPSY  2013   BREAST ENHANCEMENT SURGERY  1987   BREAST IMPLANT REMOVAL     BREAST IMPLANT REMOVAL Right 08/29/2012   BREAST SURGERY  1986   s/p left mastectomy   COLONOSCOPY WITH PROPOFOL N/A 09/13/2016   Procedure: COLONOSCOPY WITH PROPOFOL;  Surgeon: Scot Jun, MD;  Location: [facility] ENDOSCOPY;  Service: Endoscopy;  Laterality: N/A;   MASTECTOMY  08/2012   right   TONSILLECTOMY AND ADENOIDECTOMY  1979   VESICOVAGINAL FISTULA CLOSURE W/ TAH  1983   Family History  Problem Relation Age of Onset   Diabetes Father    Stroke Father    Colon polyps Father    Stroke Mother    Parkinson's disease Mother    Social History   Socioeconomic History   Marital status: Married    Spouse name: Not on file   Martinez of children: 3   Years of education: Not on file   Highest education level: Not on file  Occupational History   Not on file  Tobacco Use   Smoking status: Never    Passive exposure: Never   Smokeless tobacco: Never   Tobacco comments:  tobacco use - no  Vaping Use   Vaping Use: Never used  Substance and Sexual Activity   Alcohol use: No    Alcohol/week: 0.0 standard drinks of alcohol   Drug use: No   Sexual activity: Never  Other Topics Concern   Not on file  Social History Narrative   Not on file   Social Determinants of Health   Financial Resource Strain: Low Risk  (07/10/2019)   Overall Financial Resource Strain (CARDIA)    Difficulty of Paying Living Expenses: Not hard at all  Food Insecurity: No Food Insecurity (07/10/2019)   Hunger Vital Sign    Worried About Running Out of Food in the Last Year: Never true    Ran Out of Food in the Last Year: Never true  Transportation Needs: No Transportation Needs (07/12/2020)   PRAPARE -  Administrator, Civil Service (Medical): No    Lack of Transportation (Non-Medical): No  Physical Activity: Not on file  Stress: No Stress Concern Present (07/12/2020)   [employer] of Occupational Health - Occupational Stress Questionnaire    Feeling of Stress : Not at all  Social Connections: Socially Integrated (07/12/2020)   Social Connection and Isolation Panel [NHANES]    Frequency of Communication with Friends and Family: Three times a week    Frequency of Social Gatherings with Friends and Family: More than three times a week    Attends Religious Services: More than 4 times per year    Active Member of Clubs or Organizations: Yes    Attends Banker Meetings: 1 to 4 times per year    Marital Status: Married     Review of Systems  Constitutional:  Negative for appetite change and unexpected weight change.  HENT:  Negative for congestion and sinus pressure.   Respiratory:  Negative for cough and chest tightness.        Breathing stable.   Cardiovascular:  Negative for chest pain and palpitations.       No increased swelling.   Gastrointestinal:  Negative for abdominal pain, diarrhea, nausea and vomiting.  Genitourinary:  Negative for difficulty urinating and dysuria.  Musculoskeletal:  Negative for myalgias.       S/p gel injections as outlined. Helped.    Skin:  Negative for color change and rash.  Neurological:  Negative for dizziness and headaches.  Psychiatric/Behavioral:  Negative for agitation and dysphoric mood.        Objective:     BP 122/68   Pulse 60   Temp 98.3 F (36.8 C)   Resp 16   Ht 5\' 4"  (1.626 m)   Wt 182 lb 3.2 oz (82.6 kg)   LMP 12/18/1981   SpO2 98%   BMI 31.27 kg/m  Wt Readings from Last 3 Encounters:  05/22/23 176 lb (79.8 kg)  05/21/23 182 lb 3.2 oz (82.6 kg)  05/15/23 180 lb (81.6 kg)    Physical Exam Vitals reviewed.  Constitutional:      General: She is not in acute distress.    Appearance: Normal  appearance.  HENT:     Head: Normocephalic and atraumatic.     Right Ear: External ear normal.     Left Ear: External ear normal.  Eyes:     General: No scleral icterus.       Right eye: No discharge.        Left eye: No discharge.     Conjunctiva/sclera: Conjunctivae normal.  Neck:  Thyroid: No thyromegaly.  Cardiovascular:     Rate and Rhythm: Normal rate and regular rhythm.  Pulmonary:     Effort: No respiratory distress.     Breath sounds: Normal breath sounds. No wheezing.  Abdominal:     General: Bowel sounds are normal.     Palpations: Abdomen is soft.     Tenderness: There is no abdominal tenderness.  Musculoskeletal:        General: No swelling or tenderness.     Cervical back: Neck supple. No tenderness.  Lymphadenopathy:     Cervical: No cervical adenopathy.  Skin:    Findings: No erythema or rash.  Neurological:     Mental Status: She is alert.  Psychiatric:        Mood and Affect: Mood normal.        Behavior: Behavior normal.      Outpatient Encounter Medications as of 05/21/2023  Medication Sig   amLODipine (NORVASC) 2.5 MG tablet Take 1 tablet (2.5 mg total) by mouth daily.   aspirin 81 MG EC tablet Take 81 mg by mouth daily as needed.   calcium carbonate (OSCAL) 1500 (600 Ca) MG TABS tablet Take 1 tablet by mouth daily.   citalopram (CELEXA) 20 MG tablet TAKE 1 TABLET BY MOUTH ONCE DAILY.   cyclobenzaprine (FLEXERIL) 10 MG tablet TAKE ONE TABLET BY MOUTH AT BEDTIME.   donepezil (ARICEPT) 10 MG tablet TAKE ONE TABLET BY MOUTH AT BEDTIME   erythromycin ophthalmic ointment Apply to eye.   gabapentin (NEURONTIN) 300 MG capsule Take 1 capsule (300 mg total) by mouth at bedtime.   Glucosamine-Chondroitin 500-400 MG CAPS Take 2 capsules by mouth daily.   HYDROcodone-acetaminophen (NORCO/VICODIN) 5-325 MG tablet Take 1 tablet by mouth 3 (three) times daily. Must last 30 days.   HYDROcodone-acetaminophen (NORCO/VICODIN) 5-325 MG tablet Take 1 tablet by mouth  3 (three) times daily. Must last 30 days.   [START ON 05/29/2023] HYDROcodone-acetaminophen (NORCO/VICODIN) 5-325 MG tablet Take 1 tablet by mouth 3 (three) times daily. Must last 30 days.   hydrocortisone (ANUSOL-HC) 25 MG suppository Place 1 suppository (25 mg total) rectally 2 (two) times daily.   letrozole (FEMARA) 2.5 MG tablet Take 2.5 mg by mouth daily.   losartan (COZAAR) 50 MG tablet TAKE (2) TABLETS BY MOUTH ONCE DAILY.   lubiprostone (AMITIZA) 24 MCG capsule TAKE 1 CAPSULE BY MOUTH ONCE DAILY WITH BREAKFAST.   magnesium oxide (MAG-OX) 400 (240 Mg) MG tablet TAKE ONE TABLET BY MOUTH EVERY DAY   melatonin 1 MG TABS tablet Take 3 mg by mouth at bedtime.   metoprolol succinate (TOPROL-XL) 25 MG 24 hr tablet Take 1 tablet (25 mg total) by mouth 2 (two) times daily.   mirabegron ER (MYRBETRIQ) 50 MG TB24 tablet Take 1 tablet (50 mg total) by mouth daily.   Multiple Vitamin (MULTIVITAMIN) tablet Take 1 tablet by mouth daily.   naloxone (NARCAN) nasal spray 4 mg/0.1 mL Place 1 spray into the nose as needed for up to 365 doses (for opioid-induced respiratory depresssion). In case of emergency (overdose), spray once into each nostril. If no response within 3 minutes, repeat application and call 911.   nitrofurantoin, macrocrystal-monohydrate, (MACROBID) 100 MG capsule Take 1 capsule (100 mg total) by mouth daily.   Omega-3 1000 MG CAPS Take by mouth.   pantoprazole (PROTONIX) 40 MG tablet Take 1 tablet (40 mg total) by mouth daily.   rosuvastatin (CRESTOR) 20 MG tablet TAKE 1 TABLET BY MOUTH ONCE DAILY.   [  DISCONTINUED] GLUCOSAMINE SULFATE PO Take by mouth daily. (Patient not taking: Reported on 05/02/2023)   No facility-administered encounter medications on file as of 05/21/2023.     Lab Results  Component Value Date   WBC 4.6 05/15/2023   HGB 9.9 (L) 05/15/2023   HCT 30.7 (L) 05/15/2023   PLT 210 05/15/2023   GLUCOSE 88 05/15/2023   CHOL 123 01/31/2023   TRIG 112.0 01/31/2023   HDL 56.20  01/31/2023   LDLDIRECT 73.0 07/09/2018   LDLCALC 44 01/31/2023   ALT 18 04/09/2023   AST 26 04/09/2023   NA 133 (L) 05/15/2023   K 3.5 05/15/2023   CL 95 (L) 05/15/2023   CREATININE 1.44 (H) 05/15/2023   BUN 35 (H) 05/15/2023   CO2 28 05/15/2023   TSH 1.42 09/26/2022   HGBA1C 6.6 (H) 01/31/2023    DG PAIN CLINIC C-ARM 1-60 MIN NO REPORT  Result Date: 04/12/2023 Fluoro was used, but no Radiologist interpretation will be provided. Please refer to "NOTES" tab for provider progress note.      Assessment & Plan:  Anemia, unspecified type Assessment & Plan: Hematology 04/2023 - retacrit.  F/u. Retacrit if her hemoglobin remains below 10.0    Aortic atherosclerosis (HCC) Assessment & Plan: Continue crestor.    Stage 3a chronic kidney disease (HCC) Assessment & Plan: Avoid antiinflammatories.  Stay hdrated. Recheck metabolic panel today.    Depression, major, single episode, mild (HCC) Assessment & Plan: Continue citalopram.  Overall appears to be handling things well.  Follow.     Gastroesophageal reflux disease, unspecified whether esophagitis present Assessment & Plan: No upper symptoms reported. On protonix.    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  Patient completed 5 years of letrozole in May 2019.  No further interventions are needed.  Patient has had bilateral mastectomies, therefore no further mammograms are necessary.    Hypercholesterolemia Assessment & Plan: On crestor.  Follow lipid panel and liver function tests.     Hyperglycemia Assessment & Plan: Low carb diet and exercise.  Follow met b and a1c.    Hypertension, unspecified type Assessment & Plan: Blood pressure as outlined. Had previously been on Hctz, metoprolol, amlodipine and losartan. HCTZ and metoprolol stopped while in  hospital.  Added back metoprolol last visit.  Feeling better.  Pressures better.  Continue to follow pressures. No changes today.    Osteoporosis without current pathological fracture, unspecified osteoporosis type Assessment & Plan: zometa - per Dr Orlie Dakin    Parkinson's disease, unspecified whether dyskinesia present, unspecified whether manifestations fluctuate Assessment & Plan: Has seen neurology previously.  It appears that she was previously on sinemet, but this was stopped years ago given question of diagnosis of parkinsons.  She has been maintained on gabapentin - to help with tremors, pain, etc.  Has seen Dr Malvin Johns - neurology.  She had f/u with neurology.  Neurology does not feel symptoms are c/w parkinsons.  On gabapentin.  Tremor has improved.  Continue gabapentin.  Continue metoprolol.    PSVT (paroxysmal supraventricular tachycardia) Assessment & Plan: Dr Azucena Cecil - 05/02/23 - toprol.  EF 60%   Pure hypercholesterolemia Assessment & Plan: On crestor.  Follow lipid panel and liver function tests.     Tremor Assessment & Plan: Improved - on gabapentin and metoprolol.       Dale , MD

## 2023-05-21 NOTE — Progress Notes (Unsigned)
PROVIDER NOTE: Information contained herein reflects review and annotations entered in association with encounter. Interpretation of such information and data should be left to medically-trained personnel. Information provided to patient can be located elsewhere in the medical record under "Patient Instructions". Document created using STT-dictation technology, any transcriptional errors that may result from process are unintentional.    Patient: Wanda Martinez  Service Category: E/M  Provider: Oswaldo Done, MD  DOB: 1935/03/22  DOS: 05/22/2023  Referring Provider: Dale [place], MD  MRN: [id number]  Specialty: Interventional Pain Management  PCP: Dale [place], MD  Type: Established Patient  Setting: Ambulatory outpatient    Location: Office  Delivery: Face-to-face     HPI  [name], a 88 y.o. year old female, is here today because of her Chronic pain of right knee [M25.561, G89.29]. [name] primary complain today is No chief complaint on file.  Pertinent problems: [name] has Headache; Parkinson's disease; Chronic low back pain (1ry area of Pain) (Bilateral) (R>L) w/o sciatica; Lumbar spondylosis; Chronic hip pain (Bilateral); Chronic neck pain; Cervical spondylosis; Chronic cervical radicular pain (Right); Diffuse myofascial pain syndrome; Neurogenic pain; Chronic upper back pain (Right); Myofascial pain syndrome (Right) (cervicothoracic); Lumbar facet syndrome (Bilateral) (R>L); History of breast cancer; Cervical facet hypertrophy; Cervical facet syndrome (HCC); Chronic shoulder pain (Right); Chronic pain syndrome; Chronic sacroiliac joint pain (Left); Chronic sacroiliac joint pain (Right); 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, L4-5, L5-S1) (Right); Lumbar spinal stenosis (with neurogenic claudication) (L3-4); Chronic lower extremity pain (2ry area of Pain) (Right); Chronic lumbar radicular pain (S1) (Right); Trochanteric bursitis of hip (Bilateral); Spondylosis  without myelopathy or radiculopathy, lumbosacral region; Trigger point with back pain (Right); DDD (degenerative disc disease), lumbosacral; Chronic upper extremity pain (Right); Chronic thoracic back pain (Bilateral) (L>R); Trigger point of thoracic region (Bilateral) (L>R); Other specified dorsopathies, sacral and sacrococcygeal region; Sacroiliac joint dysfunction (Right); Osteoarthritis of sacroiliac joint (Right); Somatic dysfunction of sacroiliac joint (Right); Chronic musculoskeletal pain; Facial pain; Chronic hip pain (Right); Osteoarthritis of hip (Right); Left ear pain; 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HCC); Abnormal MRI, lumbar spine (08/23/2022); Osteoarthritis involving multiple joints; Other spondylosis, sacral and sacrococcygeal region; Lumbar facet hypertrophy (Multilevel) (Bilateral); Chronic knee pain (Right); Osteoarthritis of knee (Right); Trigger point of shoulder region (Right); Osteoarthritis of AC (acromioclavicular) joint (Right); Osteoarthritis of shoulder (Right); Unspecified injury of muscle(s) and tendon(s) of the rotator cuff of shoulder, sequela (Right); Cervicalgia; DDD (degenerative disc disease), lumbar; Lumbar radicular pain (Bilateral); Lumbar foraminal stenosis (Right: L3-4, L4-5, L5-S1) (Left: L2-3); Abnormal MRI, hip (Right) (05/04/2022); Abnormal MRI, knee (01/04/2022); Tricompartment osteoarthritis of knee (Right); Chronic low back pain (Right) w/o sciatica; Hoffa's fat pad disease (HCC) (Right); and Hoffa's pad syndrome (HCC) (Right) on their pertinent problem list. Pain Assessment: Severity of   is reported as a  /10. Location:    / . Onset:  . Quality:  . Timing:  . Modifying factor(s):  Marland Kitchen Vitals:  vitals were not taken for this visit.  BMI: Estimated body mass index is 31.27 kg/m as calculated from the following:   Height as of 05/21/23: 5\' 4"  (1.626 m).   Weight as of 05/21/23: 182 lb 3.2 oz (82.6 kg). Last encounter: 04/25/2023. Last procedure: 04/26/2023.  Reason for  encounter: post-procedure evaluation and assessment. ***  Post-procedure evaluation   Type:  Steroid Intra-articular Knee Injection          Laterality: Right (-RT) Level/approach: Lateral Imaging guidance: None required (CPT-20610) Anesthesia: Local anesthesia (1-2% Lidocaine) Anxiolysis: None  Sedation: No Sedation                       DOS: 04/26/2023  Performed by: Oswaldo Done, MD  Purpose: Diagnostic/Therapeutic Indications: Knee arthralgia associated to osteoarthritis of the knee 1. Chronic knee pain (Right)   2. Osteoarthritis of knee (Right)   3. Hoffa's fat pad disease (HCC) (Right)   4. Hoffa's pad syndrome (HCC) (Right)   5. Tricompartment osteoarthritis of knee (Right)    NAS-11 score:   Pre-procedure: 8 /10   Post-procedure: 0-No pain/10   Note: Today I had scheduled the patient to return for a Zilretta knee injection however, the hospital has ran out of supply.  The patient was consulted and she was okay to proceed with a different steroid.     Effectiveness:  Initial hour after procedure:   ***. Subsequent 4-6 hours post-procedure:   ***. Analgesia past initial 6 hours:   ***. Ongoing improvement:  Analgesic:  *** Function:    ***    ROM:    ***     Pharmacotherapy Assessment  Analgesic: Hydrocodone/APAP 5/325, 1 tab PO q 8 hrs (15 mg/day of hydrocodone) MME/day: 15 mg/day.   Monitoring: [place] PMP: PDMP reviewed during this encounter.       Pharmacotherapy: No side-effects or adverse reactions reported. Compliance: No problems identified. Effectiveness: Clinically acceptable.  No notes on file  No results found for: "CBDTHCR" No results found for: "D8THCCBX" No results found for: "D9THCCBX"  UDS:  Summary  Date Value Ref Range Status  06/12/2022 Note  Final    Comment:    ==================================================================== [employer] [facility]  (MW) ==================================================================== Test                             Result       Flag       Units  Drug Present and Declared for Prescription Verification   Hydrocodone                    1766         EXPECTED   ng/mg creat   Dihydrocodeine                 303          EXPECTED   ng/mg creat   Norhydrocodone                 1597         EXPECTED   ng/mg creat    Sources of hydrocodone include scheduled prescription medications.    Dihydrocodeine and norhydrocodone are expected metabolites of    hydrocodone. Dihydrocodeine is also available as a scheduled    prescription medication.  ==================================================================== Test                      Result    Flag   Units      Ref Range   Creatinine              38               mg/dL      >=29 ==================================================================== Declared Medications:  The flagging and interpretation on this report are based on the  following declared medications.  Unexpected results may arise from  inaccuracies in the declared medications.   **Note: The testing scope of this panel includes these medications:  Hydrocodone (Norco)   **Note: The testing scope of this panel does not include the  following reported medications:   Acetaminophen (Norco)  Amlodipine  Aspirin  Calcium  Chondroitin  Citalopram (Celexa)  Cyclobenzaprine (Flexeril)  Donepezil (Aricept)  Gabapentin (Neurontin)  Glucosamine  Letrozole (Femara)  Losartan (Cozaar)  Lubiprostone (Amitiza)  Magnesium (Mag-Ox)  Melatonin  Meloxicam (Mobic)  Metoprolol  Multivitamin  Nitrofurantoin (Macrobid)  Omega-3 Fatty Acids  Omeprazole  Pantoprazole (Protonix)  Rosuvastatin (Crestor) ==================================================================== For clinical consultation, please call [phone]. ====================================================================        ROS  Constitutional: Denies any fever or chills Gastrointestinal: No reported hemesis, hematochezia, vomiting, or acute GI distress Musculoskeletal: Denies any acute onset joint swelling, redness, loss of ROM, or weakness Neurological: No reported episodes of acute onset apraxia, aphasia, dysarthria, agnosia, amnesia, paralysis, loss of coordination, or loss of consciousness  Medication Review  Glucosamine-Chondroitin, HYDROcodone-acetaminophen, Omega-3, amLODipine, aspirin EC, calcium carbonate, citalopram, cyclobenzaprine, donepezil, erythromycin, gabapentin, hydrocortisone, letrozole, losartan, lubiprostone, magnesium oxide, melatonin, metoprolol succinate, mirabegron ER, multivitamin, naloxone, nitrofurantoin (macrocrystal-monohydrate), pantoprazole, and rosuvastatin  History Review  Allergy: [name] is allergic to sulfa antibiotics and vesicare [solifenacin]. Drug: [name]  reports no history of drug use. Alcohol:  reports no history of alcohol use. Tobacco:  reports that she has never smoked. She has never been exposed to tobacco smoke. She has never used smokeless tobacco. Social: [name]  reports that she has never smoked. She has never been exposed to tobacco smoke. She has never used smokeless tobacco. She reports that she does not drink alcohol and does not use drugs. Medical:  has a past medical history of Acute postoperative pain (10/25/2017), Anemia, Arm pain (07/26/2015), Arthritis, Arthritis, degenerative (03/26/2014), Back pain (11/01/2013), Bladder infection (06/2018), Breast cancer (HCC), Breast cancer (HCC), CHEST PAIN (04/29/2010), Chronic cystitis, Cystocele (02/22/2013), Cystocele, midline (08/19/2013), Degeneration of intervertebral disc of lumbosacral region (03/26/2014), DYSPNEA (04/29/2010), Enthesopathy of hip (03/26/2014), GERD (gastroesophageal reflux disease), Hiatal hernia, HTN (hypertension), Hypokalemia (06/2018), Hyponatremia (06/2018), LBP (low back pain)  (03/26/2014), Neck pain (11/01/2013), Parkinson disease, Pneumonia (06/2018), Sinusitis (02/07/2015), Skin lesions (07/12/2014), and Urinary incontinence. Surgical: [name]  has a past surgical history that includes Tonsillectomy and adenoidectomy (1979); Vesicovaginal fistula closure w/ TAH (1983); Breast surgery (1986); Breast enhancement surgery (1987); Breast implant removal; Breast implant removal (Right, 08/29/2012); Mastectomy (08/2012); Abdominal hysterectomy; Colonoscopy with propofol (N/A, 09/13/2016); Breast biopsy (2013); and Blepharoplasty. Family: family history includes Colon polyps in her father; Diabetes in her father; Parkinson's disease in her mother; Stroke in her father and mother.  Laboratory Chemistry Profile   Renal Lab Results  Component Value Date   BUN 35 (H) 05/15/2023   CREATININE 1.44 (H) 05/15/2023   BCR 20 06/11/2020   GFR 48.46 (L) 04/09/2023   GFRAA >60 07/27/2020   GFRNONAA 36 (L) 05/15/2023    Hepatic Lab Results  Component Value Date   AST 26 04/09/2023   ALT 18 04/09/2023   ALBUMIN 4.1 04/09/2023   ALKPHOS 30 (L) 04/09/2023    Electrolytes Lab Results  Component Value Date   NA 133 (L) 05/15/2023   K 3.5 05/15/2023   CL 95 (L) 05/15/2023   CALCIUM 9.3 05/15/2023   MG 2.2 07/17/2018    Bone Lab Results  Component Value Date   VD25OH 66.95 09/26/2022   25OHVITD1 42 07/17/2018   25OHVITD2 <1.0 07/17/2018   25OHVITD3 42 07/17/2018    Inflammation (CRP: Acute Phase) (ESR: Chronic Phase) Lab Results  Component Value Date  CRP 3 07/17/2018   ESRSEDRATE 27 07/17/2018         Note: Above Lab results reviewed.  Recent Imaging Review  DG PAIN CLINIC C-ARM 1-60 MIN NO REPORT Fluoro was used, but no Radiologist interpretation will be provided.  Please refer to "NOTES" tab for provider progress note. Note: Reviewed        Physical Exam  General appearance: Well nourished, well developed, and well hydrated. In no apparent acute  distress Mental status: Alert, oriented x 3 (person, place, & time)       Respiratory: No evidence of acute respiratory distress Eyes: PERLA Vitals: LMP 12/18/1981  BMI: Estimated body mass index is 31.27 kg/m as calculated from the following:   Height as of 05/21/23: 5\' 4"  (1.626 m).   Weight as of 05/21/23: 182 lb 3.2 oz (82.6 kg). Ideal: Ideal body weight: 54.7 kg (120 lb 9.5 oz) Adjusted ideal body weight: 65.9 kg (145 lb 3.8 oz)  Assessment   Diagnosis Status  1. Chronic knee pain (Right)   2. Hoffa's fat pad disease (HCC) (Right)   3. Hoffa's pad syndrome (HCC) (Right)   4. Osteoarthritis of knee (Right)   5. Postop check    Controlled Controlled Controlled   Updated Problems: No problems updated.  Plan of Care  Problem-specific:  No problem-specific Assessment & Plan notes found for this encounter.  [name] has a current medication list which includes the following long-term medication(s): amlodipine, calcium carbonate, citalopram, cyclobenzaprine, donepezil, gabapentin, hydrocodone-acetaminophen, hydrocodone-acetaminophen, [START ON 05/29/2023] hydrocodone-acetaminophen, losartan, magnesium oxide, metoprolol succinate, mirabegron er, pantoprazole, and rosuvastatin.  Pharmacotherapy (Medications Ordered): No orders of the defined types were placed in this encounter.  Orders:  No orders of the defined types were placed in this encounter.  Follow-up plan:   No follow-ups on file.      Interventional Therapies  Risk Factors  Considerations:   Parkinson's disease  Hx right breast cancer  OIC  SOB  CKD Stage 3  GERD  depression  osteopenia/osteoporosis     Planned  Pending:   Therapeutic/palliative right IA knee Zilretta knee injection #2    Under consideration:   Therapeutic right SI joint injection #4  Diagnostic right genicular NB #1  Diagnostic bilateral cervical facet MBB  Therapeutic right L3 and L4 TFESI #1    Completed:    Therapeutic right superior/lateral trapezius muscle TPI x1 (04/19/2021)  Therapeutic right thoracic back TPI x2 (10/17/2018) (DKFUA)  Therapeutic left thoracic back TPI x1 (10/17/2018) (DKFUA)  Therapeutic right quadratus lumborum and erector spinae muscle TPI x1 (02/12/2018)  Therapeutic bilateral lumbar facet MBB (L2-S1) (FCT: L3-4,L4-5,L5-S1) x5 (08/09/2021) (100/100/50)  Therapeutic bilateral lumbar facet MBB (L4-S1) (FCT: L5-S1) x1 (08/29/2022) (100/100/100 x3 days/R:0L:100)  Therapeutic right lumbar facet MBB (L2-S1) x4 (12/21/2020) (DKFUA)  Therapeutic right lumbar facet RFA (L2-S1) x3 (09/28/2022) (100/100/75/75)  Therapeutic right lumbar facet RFA (L3-S1) x1 (03/01/2023) (100/100/70/70)  Therapeutic left lumbar facet RFA x2 (11/30/2022) (100/60/90/>75)  Diagnostic right SI joint Blk x3 (12/21/2020)  (DKFUA)  Diagnostic left SI joint Blk x2 (04/12/2023) (100/90/20/<25)  Therapeutic right SI joint RFA x4 (03/01/2023) (100/100/70/70)  Therapeutic midline L2-3 LESI x1 (10/04/2017) (100/100/85/>50)  Therapeutic right L3-4 LESI x2 (11/04/2020) (100/100/20/<50)  Therapeutic right L4-5 LESI x2 (03/30/2022) (100/100/75/75)  Therapeutic right L5-S1 LESI x2 (12/04/2019) (100/100/50/75)  Diagnostic/therapeutic right L3 and L4 TFESI x1 (07/27/2022) (100/100/75/50)  Therapeutic right L5 TFESI x4 (02/14/2022) (100/100/70/75)  Therapeutic right S1 TFESI x1 (11/04/2020) (100/100/20/<50)  Diagnostic bilateral superficial trochanteric bursa  injec. x1 (04/30/2018) [facility])  Diagnostic right IA hip injection x1 (02/13/2019) (DKFUA)  Diagnostic/therapeutic right IA steroid knee inj. x4 (01/10/2023) (100/100/100/80)  Therapeutic right (Zilretta) knee injection x1 (10/06/2021) (DKFUA)  Therapeutic right (Monovisc) knee injection x1 (06/07/2021) (100/100/100/75)    Therapeutic  Palliative (PRN) options:   Therapeutic/palliative lumbar facet RFA  Therapeutic/palliative lumbar facet MBB  Therapeutic left L2  TFESI (this level is responsible for her low back pain and left lower extremity pain.)   Pharmacotherapy  Nonopioids transfer 12/21/2020: Gabapentin        Recent Visits Date Type Provider Dept  04/26/23 Procedure visit [name], MD [facility]  04/25/23 Office Visit [name], MD [facility]  04/12/23 Procedure visit [name], MD [facility]  03/15/23 Office Visit [name], MD [facility]  03/01/23 Procedure visit [name], MD [facility]  Showing recent visits within past 90 days and meeting all other requirements Future Appointments Date Type Provider Dept  05/22/23 Appointment [name], MD [facility]  06/05/23 Appointment [name], MD [facility]  06/25/23 Appointment [name], MD [facility]  Showing future appointments within next 90 days and meeting all other requirements  I discussed the assessment and treatment plan with the patient. The patient was provided an opportunity to ask questions and all were answered. The patient agreed with the plan and demonstrated an understanding of the instructions.  Patient advised to call back or seek an in-person evaluation if the symptoms or condition worsens.  Duration of encounter: *** minutes.  Total time on encounter, as per AMA guidelines included both the face-to-face and non-face-to-face time personally spent by the physician and/or other qualified health care professional(s) on the day of the encounter (includes time in activities that require the physician or other qualified health care professional and does not include time in activities normally performed by clinical staff). Physician's time may include the following activities when performed: Preparing to see the patient (e.g., pre-charting review of records, searching for previously ordered imaging, lab work, and nerve  conduction tests) Review of prior analgesic pharmacotherapies. Reviewing PMP Interpreting ordered tests (e.g., lab work, imaging, nerve conduction tests) Performing post-procedure evaluations, including interpretation of diagnostic procedures Obtaining and/or reviewing separately obtained history Performing a medically appropriate examination and/or evaluation Counseling and educating the patient/family/caregiver Ordering medications, tests, or procedures Referring and communicating with other health care professionals (when not separately reported) Documenting clinical information in the electronic or other health record Independently interpreting results (not separately reported) and communicating results to the patient/ family/caregiver Care coordination (not separately reported)  Note by: Oswaldo Done, MD Date: 05/22/2023; Time: 5:55 PM

## 2023-05-22 ENCOUNTER — Ambulatory Visit: Payer: Medicare HMO | Attending: Pain Medicine | Admitting: Pain Medicine

## 2023-05-22 ENCOUNTER — Encounter: Payer: Self-pay | Admitting: Pain Medicine

## 2023-05-22 ENCOUNTER — Telehealth: Payer: Self-pay | Admitting: *Deleted

## 2023-05-22 VITALS — BP 132/70 | HR 50 | Temp 97.3°F | Resp 14 | Ht 64.0 in | Wt 176.0 lb

## 2023-05-22 DIAGNOSIS — R937 Abnormal findings on diagnostic imaging of other parts of musculoskeletal system: Secondary | ICD-10-CM | POA: Diagnosis not present

## 2023-05-22 DIAGNOSIS — M1711 Unilateral primary osteoarthritis, right knee: Secondary | ICD-10-CM | POA: Insufficient documentation

## 2023-05-22 DIAGNOSIS — M5410 Radiculopathy, site unspecified: Secondary | ICD-10-CM | POA: Diagnosis present

## 2023-05-22 DIAGNOSIS — Z79891 Long term (current) use of opiate analgesic: Secondary | ICD-10-CM | POA: Insufficient documentation

## 2023-05-22 DIAGNOSIS — M5441 Lumbago with sciatica, right side: Secondary | ICD-10-CM | POA: Diagnosis not present

## 2023-05-22 DIAGNOSIS — M79604 Pain in right leg: Secondary | ICD-10-CM | POA: Insufficient documentation

## 2023-05-22 DIAGNOSIS — M48061 Spinal stenosis, lumbar region without neurogenic claudication: Secondary | ICD-10-CM

## 2023-05-22 DIAGNOSIS — M5417 Radiculopathy, lumbosacral region: Secondary | ICD-10-CM | POA: Insufficient documentation

## 2023-05-22 DIAGNOSIS — M5137 Other intervertebral disc degeneration, lumbosacral region: Secondary | ICD-10-CM | POA: Diagnosis not present

## 2023-05-22 DIAGNOSIS — R29898 Other symptoms and signs involving the musculoskeletal system: Secondary | ICD-10-CM | POA: Insufficient documentation

## 2023-05-22 DIAGNOSIS — G894 Chronic pain syndrome: Secondary | ICD-10-CM

## 2023-05-22 DIAGNOSIS — Z09 Encounter for follow-up examination after completed treatment for conditions other than malignant neoplasm: Secondary | ICD-10-CM | POA: Diagnosis not present

## 2023-05-22 DIAGNOSIS — M25561 Pain in right knee: Secondary | ICD-10-CM | POA: Diagnosis not present

## 2023-05-22 DIAGNOSIS — Z79899 Other long term (current) drug therapy: Secondary | ICD-10-CM | POA: Insufficient documentation

## 2023-05-22 DIAGNOSIS — M5431 Sciatica, right side: Secondary | ICD-10-CM | POA: Insufficient documentation

## 2023-05-22 DIAGNOSIS — M545 Low back pain, unspecified: Secondary | ICD-10-CM | POA: Insufficient documentation

## 2023-05-22 DIAGNOSIS — M48062 Spinal stenosis, lumbar region with neurogenic claudication: Secondary | ICD-10-CM | POA: Diagnosis not present

## 2023-05-22 DIAGNOSIS — M4807 Spinal stenosis, lumbosacral region: Secondary | ICD-10-CM | POA: Diagnosis not present

## 2023-05-22 DIAGNOSIS — M5416 Radiculopathy, lumbar region: Secondary | ICD-10-CM | POA: Diagnosis not present

## 2023-05-22 DIAGNOSIS — G8929 Other chronic pain: Secondary | ICD-10-CM | POA: Insufficient documentation

## 2023-05-22 DIAGNOSIS — E8889 Other specified metabolic disorders: Secondary | ICD-10-CM

## 2023-05-22 MED ORDER — OXYCODONE HCL 5 MG PO TABS
5.0000 mg | ORAL_TABLET | Freq: Four times a day (QID) | ORAL | 0 refills | Status: DC | PRN
Start: 2023-05-22 — End: 2023-06-21

## 2023-05-22 NOTE — Telephone Encounter (Signed)
Called to [facility] pharmacy to cancel any existing hydrocodone - apap 5 -325 mg d/t FN changing Rx to oxycodone 5 mg q 6 prn severe pain. Spoke with pharmacist, Tray.

## 2023-05-22 NOTE — Patient Instructions (Signed)

## 2023-05-23 ENCOUNTER — Encounter: Payer: Medicare HMO | Admitting: Pain Medicine

## 2023-05-23 DIAGNOSIS — M1711 Unilateral primary osteoarthritis, right knee: Secondary | ICD-10-CM | POA: Diagnosis not present

## 2023-05-27 ENCOUNTER — Encounter: Payer: Self-pay | Admitting: Internal Medicine

## 2023-05-27 NOTE — Assessment & Plan Note (Signed)
Avoid antiinflammatories.  Stay hdrated. Recheck metabolic panel today.  

## 2023-05-27 NOTE — Assessment & Plan Note (Signed)
On crestor.  Follow lipid panel and liver function tests.   

## 2023-05-27 NOTE — Assessment & Plan Note (Signed)
No upper symptoms reported.  On protonix.   

## 2023-05-27 NOTE — Assessment & Plan Note (Signed)
Has seen neurology previously.  It appears that she was previously on sinemet, but this was stopped years ago given question of diagnosis of parkinsons.  She has been maintained on gabapentin - to help with tremors, pain, etc.  Has seen Dr Malvin Johns - neurology.  She had f/u with neurology.  Neurology does not feel symptoms are c/w parkinsons.  On gabapentin.  Tremor has improved.  Continue gabapentin.  Continue metoprolol.

## 2023-05-27 NOTE — Assessment & Plan Note (Signed)
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  Patient completed 5 years of letrozole in May 2019.  No further interventions are needed.  Patient has had bilateral mastectomies, therefore no further mammograms are necessary.  ?

## 2023-05-27 NOTE — Assessment & Plan Note (Signed)
Low carb diet and exercise.  Follow met b and a1c.   

## 2023-05-27 NOTE — Assessment & Plan Note (Signed)
Improved - on gabapentin and metoprolol.

## 2023-05-27 NOTE — Assessment & Plan Note (Signed)
Dr Azucena Cecil - 05/02/23 - toprol.  EF 60%

## 2023-05-27 NOTE — Assessment & Plan Note (Signed)
Blood pressure as outlined. Had previously been on Hctz, metoprolol, amlodipine and losartan. HCTZ and metoprolol stopped while in hospital.  Added back metoprolol last visit.  Feeling better.  Pressures better.  Continue to follow pressures. No changes today.

## 2023-05-27 NOTE — Assessment & Plan Note (Signed)
Continue citalopram.  Overall appears to be handling things well.  Follow.   

## 2023-05-27 NOTE — Assessment & Plan Note (Signed)
zometa - per Dr Finnegan  

## 2023-05-27 NOTE — Assessment & Plan Note (Signed)
Hematology 04/2023 - retacrit.  F/u. Retacrit if her hemoglobin remains below 10.0

## 2023-05-27 NOTE — Assessment & Plan Note (Signed)
Continue crestor 

## 2023-05-30 DIAGNOSIS — M1711 Unilateral primary osteoarthritis, right knee: Secondary | ICD-10-CM | POA: Diagnosis not present

## 2023-06-04 ENCOUNTER — Ambulatory Visit: Payer: Medicare HMO | Admitting: Internal Medicine

## 2023-06-04 NOTE — Progress Notes (Unsigned)
PROVIDER NOTE: Information contained herein reflects review and annotations entered in association with encounter. Interpretation of such information and data should be left to medically-trained personnel. Information provided to patient can be located elsewhere in the medical record under "Patient Instructions". Document created using STT-dictation technology, any transcriptional errors that may result from process are unintentional.    Patient: Wanda Martinez  Service Category: E/M  Provider: Oswaldo Done, MD  DOB: Jan 23, 1935  DOS: 06/05/2023  Referring Provider: Dale Amboy, MD  MRN: [id number]  Specialty: Interventional Pain Management  PCP: Dale [place], MD  Type: Established Patient  Setting: Ambulatory outpatient    Location: Office  Delivery: Face-to-face     HPI  [name], a 88 y.o. year old female, is here today because of her Chronic right shoulder pain [M25.511, G89.29]. [name] primary complain today is Shoulder Pain (right)  Pertinent problems: [name] has Headache; Parkinson's disease; Chronic low back pain (1ry area of Pain) (Bilateral) (R>L) w/o sciatica; Lumbar spondylosis; Chronic hip pain (Bilateral); Chronic neck pain; Cervical spondylosis; Chronic cervical radicular pain (Right); Diffuse myofascial pain syndrome; Neurogenic pain; Chronic upper back pain (Right); Myofascial pain syndrome (Right) (cervicothoracic); Lumbar facet syndrome (Bilateral) (R>L); History of breast cancer; Cervical facet hypertrophy; Cervical facet syndrome (HCC); Chronic shoulder pain (Right); Chronic pain syndrome; Chronic sacroiliac joint pain (Left); Chronic sacroiliac joint pain (Right); 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, L4-5, L5-S1) (Right); Lumbar spinal stenosis (with neurogenic claudication) (L3-4); Chronic lower extremity pain (Right); Chronic lumbar radicular pain (S1) (Right); Trochanteric bursitis of hip (Bilateral); Spondylosis without myelopathy or  radiculopathy, lumbosacral region; Trigger point with back pain (Right); DDD (degenerative disc disease), lumbosacral; Chronic upper extremity pain (Right); Chronic thoracic back pain (Bilateral) (L>R); Trigger point of thoracic region (Bilateral) (L>R); Other specified dorsopathies, sacral and sacrococcygeal region; Sacroiliac joint dysfunction (Right); Osteoarthritis of sacroiliac joint (Right); Somatic dysfunction of sacroiliac joint (Right); Chronic musculoskeletal pain; Facial pain; Chronic hip pain (Right); Osteoarthritis of hip (Right); Left ear pain; 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HCC); Abnormal MRI, lumbar spine (08/23/2022); Osteoarthritis involving multiple joints; Other spondylosis, sacral and sacrococcygeal region; Lumbar facet hypertrophy (Multilevel) (Bilateral); Chronic knee pain (Right); Osteoarthritis of knee (Right); Trigger point of shoulder region (Right); Osteoarthritis of AC (acromioclavicular) joint (Right); Osteoarthritis of shoulder (Right); Unspecified injury of muscle(s) and tendon(s) of the rotator cuff of shoulder, sequela (Right); Cervicalgia; DDD (degenerative disc disease), lumbar; Lumbar radicular pain (Bilateral); Lumbar foraminal stenosis (Right: L3-4, L4-5, L5-S1) (Left: L2-3); Abnormal MRI, hip (Right) (05/04/2022); Abnormal MRI, knee (01/04/2022); Tricompartment osteoarthritis of knee (Right); Chronic low back pain (Right) w/o sciatica; Hoffa's fat pad disease (HCC) (Right); Hoffa's pad syndrome (HCC) (Right); Chronic low back pain (Right) w/ sciatica (Right); Lower extremity weakness (Right); Chronic radicular pain of lower back; Chronic sciatica (Right); Lumbosacral radiculopathy at L5 (Right); and Low back pain of over 3 months duration on their pertinent problem list. Pain Assessment: Severity of Chronic pain is reported as a 6 /10. Location: Shoulder Right/radiates up right side of neck and down to shoulder blade. Onset: More than a month ago. Quality: Aching, Constant.  Timing: Constant. Modifying factor(s): denies. Vitals:  height is 5\' 4"  (1.626 m) and weight is 175 lb (79.4 kg). Her temporal temperature is 97.3 F (36.3 C) (abnormal). Her blood pressure is 151/82 (abnormal) and her pulse is 60. Her respiration is 17 and oxygen saturation is 99%.  BMI: Estimated body mass index is 30.04 kg/m as calculated from the following:   Height as of this  encounter: 5\' 4"  (1.626 m).   Weight as of this encounter: 175 lb (79.4 kg). Last encounter: 05/22/2023. Last procedure: 04/26/2023.  Reason for encounter: evaluation of worsening, or previously known (established) problem.  The patient comes into the clinic today indicating a flareup of her right shoulder pain.  She states that she would like to have that injected today.  According to her insurance, she is approved for it.  On 04/19/2021 we did trigger point injections of her right superior bilateral trapezius muscle.  X-rays of the right shoulder done on 06/01/2021 indicated acromioclavicular joint DJD with mild narrowing of the subacromial space suggestive of rotator cuff disease.  The patient presents today with pain in the area of the right shoulder and decreased range of motion.  In reviewing her medical records, I do not see where she has had an MRI of that shoulder.  Today I will be entering an order for that.  Today the indicates that the pain is on the suprascapular region.  We will proceed with a right suprascapular nerve block to see if we can help her with this pain.  Should this provide her with good relief of the pain, but no long-term benefit, we will move onto radiofrequency ablation.  Pharmacotherapy Assessment  Analgesic: Hydrocodone/APAP 5/325, 1 tab PO q 8 hrs (15 mg/day of hydrocodone) MME/day: 15 mg/day.   Monitoring: [place] PMP: PDMP reviewed during this encounter.       Pharmacotherapy: No side-effects or adverse reactions reported. Compliance: No problems identified. Effectiveness: Clinically  acceptable.  No notes on file  No results found for: "CBDTHCR" No results found for: "D8THCCBX" No results found for: "D9THCCBX"  UDS:  Summary  Date Value Ref Range Status  06/12/2022 Note  Final    Comment:    ==================================================================== [employer] Select 13 (MW) ==================================================================== Test                             Result       Flag       Units  Drug Present and Declared for Prescription Verification   Hydrocodone                    1766         EXPECTED   ng/mg creat   Dihydrocodeine                 303          EXPECTED   ng/mg creat   Norhydrocodone                 1597         EXPECTED   ng/mg creat    Sources of hydrocodone include scheduled prescription medications.    Dihydrocodeine and norhydrocodone are expected metabolites of    hydrocodone. Dihydrocodeine is also available as a scheduled    prescription medication.  ==================================================================== Test                      Result    Flag   Units      Ref Range   Creatinine              38               mg/dL      >=56 ==================================================================== Declared Medications:  The flagging and interpretation on this report are based on the  following declared medications.  Unexpected results may arise from  inaccuracies in the declared medications.   **Note: The testing scope of this panel includes these medications:   Hydrocodone (Norco)   **Note: The testing scope of this panel does not include the  following reported medications:   Acetaminophen (Norco)  Amlodipine  Aspirin  Calcium  Chondroitin  Citalopram (Celexa)  Cyclobenzaprine (Flexeril)  Donepezil (Aricept)  Gabapentin (Neurontin)  Glucosamine  Letrozole (Femara)  Losartan (Cozaar)  Lubiprostone (Amitiza)  Magnesium (Mag-Ox)  Melatonin  Meloxicam (Mobic)  Metoprolol  Multivitamin   Nitrofurantoin (Macrobid)  Omega-3 Fatty Acids  Omeprazole  Pantoprazole (Protonix)  Rosuvastatin (Crestor) ==================================================================== For clinical consultation, please call [phone]. ====================================================================       ROS  Constitutional: Denies any fever or chills Gastrointestinal: No reported hemesis, hematochezia, vomiting, or acute GI distress Musculoskeletal: Denies any acute onset joint swelling, redness, loss of ROM, or weakness Neurological: No reported episodes of acute onset apraxia, aphasia, dysarthria, agnosia, amnesia, paralysis, loss of coordination, or loss of consciousness  Medication Review  Glucosamine-Chondroitin, HYDROcodone-acetaminophen, Omega-3, amLODipine, aspirin EC, calcium carbonate, citalopram, cyclobenzaprine, donepezil, erythromycin, gabapentin, hydrocortisone, letrozole, losartan, lubiprostone, magnesium oxide, melatonin, metoprolol succinate, mirabegron ER, multivitamin, naloxone, nitrofurantoin (macrocrystal-monohydrate), oxyCODONE, pantoprazole, and rosuvastatin  History Review  Allergy: [name] is allergic to sulfa antibiotics and vesicare [solifenacin]. Drug: [name]  reports no history of drug use. Alcohol:  reports no history of alcohol use. Tobacco:  reports that she has never smoked. She has never been exposed to tobacco smoke. She has never used smokeless tobacco. Social: [name]  reports that she has never smoked. She has never been exposed to tobacco smoke. She has never used smokeless tobacco. She reports that she does not drink alcohol and does not use drugs. Medical:  has a past medical history of Acute postoperative pain (10/25/2017), Anemia, Arm pain (07/26/2015), Arthritis, Arthritis, degenerative (03/26/2014), Back pain (11/01/2013), Bladder infection (06/2018), Breast cancer (HCC), Breast cancer (HCC), CHEST PAIN (04/29/2010), Chronic cystitis,  Cystocele (02/22/2013), Cystocele, midline (08/19/2013), Degeneration of intervertebral disc of lumbosacral region (03/26/2014), DYSPNEA (04/29/2010), Enthesopathy of hip (03/26/2014), GERD (gastroesophageal reflux disease), Hiatal hernia, HTN (hypertension), Hypokalemia (06/2018), Hyponatremia (06/2018), LBP (low back pain) (03/26/2014), Neck pain (11/01/2013), Parkinson disease, Pneumonia (06/2018), Sinusitis (02/07/2015), Skin lesions (07/12/2014), and Urinary incontinence. Surgical: [name]  has a past surgical history that includes Tonsillectomy and adenoidectomy (1979); Vesicovaginal fistula closure w/ TAH (1983); Breast surgery (1986); Breast enhancement surgery (1987); Breast implant removal; Breast implant removal (Right, 08/29/2012); Mastectomy (08/2012); Abdominal hysterectomy; Colonoscopy with propofol (N/A, 09/13/2016); Breast biopsy (2013); and Blepharoplasty. Family: family history includes Colon polyps in her father; Diabetes in her father; Parkinson's disease in her mother; Stroke in her father and mother.  Laboratory Chemistry Profile   Renal Lab Results  Component Value Date   BUN 35 (H) 05/15/2023   CREATININE 1.44 (H) 05/15/2023   BCR 20 06/11/2020   GFR 48.46 (L) 04/09/2023   GFRAA >60 07/27/2020   GFRNONAA 36 (L) 05/15/2023    Hepatic Lab Results  Component Value Date   AST 26 04/09/2023   ALT 18 04/09/2023   ALBUMIN 4.1 04/09/2023   ALKPHOS 30 (L) 04/09/2023    Electrolytes Lab Results  Component Value Date   NA 133 (L) 05/15/2023   K 3.5 05/15/2023   CL 95 (L) 05/15/2023   CALCIUM 9.3 05/15/2023   MG 2.2 07/17/2018    Bone Lab Results  Component Value Date   VD25OH 66.95 09/26/2022   25OHVITD1 42 07/17/2018  25OHVITD2 <1.0 07/17/2018   25OHVITD3 42 07/17/2018    Inflammation (CRP: Acute Phase) (ESR: Chronic Phase) Lab Results  Component Value Date   CRP 3 07/17/2018   ESRSEDRATE 27 07/17/2018         Note: Above Lab results reviewed.  Recent Imaging  Review  DG PAIN CLINIC C-ARM 1-60 MIN NO REPORT Fluoro was used, but no Radiologist interpretation will be provided.  Please refer to "NOTES" tab for provider progress note. Note: Reviewed        Physical Exam  General appearance: Well nourished, well developed, and well hydrated. In no apparent acute distress Mental status: Alert, oriented x 3 (person, place, & time)       Respiratory: No evidence of acute respiratory distress Eyes: PERLA Vitals: BP (!) 151/82   Pulse 60   Temp (!) 97.3 F (36.3 C) (Temporal)   Resp 17   Ht 5\' 4"  (1.626 m)   Wt 175 lb (79.4 kg)   LMP 12/18/1981   SpO2 99%   BMI 30.04 kg/m  BMI: Estimated body mass index is 30.04 kg/m as calculated from the following:   Height as of this encounter: 5\' 4"  (1.626 m).   Weight as of this encounter: 175 lb (79.4 kg). Ideal: Ideal body weight: 54.7 kg (120 lb 9.5 oz) Adjusted ideal body weight: 64.6 kg (142 lb 5.7 oz)  Assessment   Diagnosis Status  1. Chronic shoulder pain (Right)   2. Osteoarthritis of AC (acromioclavicular) joint (Right)   3. Osteoarthritis of shoulder (Right)   4. Trigger point of shoulder region (Right)   5. Postop check   6. Chronic right shoulder pain   7. Osteoarthritis of right AC (acromioclavicular) joint   8. Primary osteoarthritis of right shoulder   9. Trigger point of right shoulder region    Controlled Controlled Controlled   Updated Problems: No problems updated.  Plan of Care  Problem-specific:  No problem-specific Assessment & Plan notes found for this encounter.  Ms. Shawnah Jersey has a current medication list which includes the following long-term medication(s): amlodipine, calcium carbonate, citalopram, cyclobenzaprine, donepezil, gabapentin, hydrocodone-acetaminophen, hydrocodone-acetaminophen, hydrocodone-acetaminophen, losartan, magnesium oxide, metoprolol succinate, mirabegron er, pantoprazole, and rosuvastatin.  Pharmacotherapy (Medications  Ordered): Meds ordered this encounter  Medications   lidocaine (XYLOCAINE) 2 % (with pres) injection 400 mg   pentafluoroprop-tetrafluoroeth (GEBAUERS) aerosol   methylPREDNISolone acetate (DEPO-MEDROL) injection 80 mg   ropivacaine (PF) 2 mg/mL (0.2%) (NAROPIN) injection 9 mL   Orders:  Orders Placed This Encounter  Procedures   SHOULDER INJECTION    Scheduling Instructions:     Procedure: Intra-articular shoulder (Glenohumeral) joint and (AC) Acromioclavicular joint injection     Side: Right-sided     Level: Glenohumeral joint and (AC) Acromioclavicular joint     Sedation: Patient's choice.     Timeframe: Today    Order Specific Question:   Where will this procedure be performed?    Answer:   [facility]   MR SHOULDER RIGHT WO CONTRAST    Standing Status:   Future    Standing Expiration Date:   07/05/2023    Scheduling Instructions:     Please make sure that the patient understands that this needs to be done as soon as possible. Never have the patient do the imaging "just before the next appointment". Inform patient that having the imaging done within the [facility] will expedite the availability of the results and will provide      imaging availability to  the requesting physician. In addition inform the patient that the imaging order has an expiration date and will not be renewed if not done within the active period.    Order Specific Question:   What is the patient's sedation requirement?    Answer:   No Sedation    Order Specific Question:   Does the patient have a pacemaker or implanted devices?    Answer:   No    Order Specific Question:   Preferred imaging location?    Answer:   [facility] (table limit-350lbs)    Order Specific Question:   Call Results- Best Contact Martinez?    Answer:   [phone] [facility])    Order Specific Question:   Radiology Contrast Protocol - do NOT remove file path    Answer:    [email or web address]   DG PAIN CLINIC C-ARM 1-60 MIN NO REPORT    Intraoperative interpretation by procedural physician at [facility].    Standing Status:   Standing    Martinez of Occurrences:   1    Order Specific Question:   Reason for exam:    Answer:   Assistance in needle guidance and placement for procedures requiring needle placement in or near specific anatomical locations not easily accessible without such assistance.   Nursing Instructions:    Please complete this patient's postprocedure evaluation.    Scheduling Instructions:     Please complete this patient's postprocedure evaluation.   Informed Consent Details: Physician/Practitioner Attestation; Transcribe to consent form and obtain patient signature    Note: Always confirm laterality of pain with [name], before procedure.    Order Specific Question:   Physician/Practitioner attestation of informed consent for procedure/surgical case    Answer:   I, the physician/practitioner, attest that I have discussed with the patient the benefits, risks, side effects, alternatives, likelihood of achieving goals and potential problems during recovery for the procedure that I have provided informed consent.    Order Specific Question:   Procedure    Answer:   Intra-articular shoulder joint injection under fluoroscopic guidance    Order Specific Question:   Physician/Practitioner performing the procedure    Answer:   Darlen Gledhill A. Laban Emperor, MD    Order Specific Question:   Indication/Reason    Answer:   Chronic shoulder pain secondary to shoulder arthropathy   Provide equipment / supplies at bedside    Procedure tray: "Block Tray" (Disposable  single use) Skin infiltration needle: Regular 1.5-in, 25-G, (x1) Block Needle type: Spinal Amount/quantity: 1 Size: Regular (3.5-inch) Gauge: 22G    Standing Status:   Standing    Martinez of Occurrences:   1    Order Specific Question:   Specify    Answer:    Block Tray   Follow-up plan:   Return in about 2 weeks (around 06/19/2023) for Proc-day (T,Th), (Face2F), (PPE).      Interventional Therapies  Risk Factors  Considerations:   Parkinson's disease  Hx right breast cancer  OIC  SOB  CKD Stage 3  GERD  depression  osteopenia/osteoporosis     Planned  Pending:   Therapeutic/palliative right IA knee Zilretta knee injection #2    Under consideration:   Therapeutic right SI joint injection #4  Diagnostic right genicular NB #1  Diagnostic bilateral cervical facet MBB  Therapeutic right L3 and L4 TFESI #1    Completed:   Therapeutic right superior/lateral trapezius muscle TPI x1 (04/19/2021)  Therapeutic right thoracic back TPI x2 (10/17/2018) (DKFUA)  Therapeutic left thoracic back TPI x1 (10/17/2018) (DKFUA)  Therapeutic right quadratus lumborum and erector spinae muscle TPI x1 (02/12/2018)  Therapeutic bilateral lumbar facet MBB (L2-S1) (FCT: L3-4,L4-5,L5-S1) x5 (08/09/2021) (100/100/50)  Therapeutic bilateral lumbar facet MBB (L4-S1) (FCT: L5-S1) x1 (08/29/2022) (100/100/100 x3 days/R:0L:100)  Therapeutic right lumbar facet MBB (L2-S1) x4 (12/21/2020) (DKFUA)  Therapeutic right lumbar facet RFA (L2-S1) x3 (09/28/2022) (100/100/75/75)  Therapeutic right lumbar facet RFA (L3-S1) x1 (03/01/2023) (100/100/70/70)  Therapeutic left lumbar facet RFA x2 (11/30/2022) (100/60/90/>75)  Diagnostic right SI joint Blk x3 (12/21/2020)  (DKFUA)  Diagnostic left SI joint Blk x2 (04/12/2023) (100/90/20/<25)  Therapeutic right SI joint RFA x4 (03/01/2023) (100/100/70/70)  Therapeutic midline L2-3 LESI x1 (10/04/2017) (100/100/85/>50)  Therapeutic right L3-4 LESI x2 (11/04/2020) (100/100/20/<50)  Therapeutic right L4-5 LESI x2 (03/30/2022) (100/100/75/75)  Therapeutic right L5-S1 LESI x2 (12/04/2019) (100/100/50/75)  Diagnostic/therapeutic right L3 and L4 TFESI x1 (07/27/2022) (100/100/75/50)  Therapeutic right L5 TFESI x4 (02/14/2022)  (100/100/70/75)  Therapeutic right S1 TFESI x1 (11/04/2020) (100/100/20/<50)  Diagnostic bilateral superficial trochanteric bursa injec. x1 (04/30/2018) (DKFUA)  Diagnostic right IA hip injection x1 (02/13/2019) (DKFUA)  Diagnostic/therapeutic right IA steroid knee inj. x4 (01/10/2023) (100/100/100/80)  Therapeutic right (Zilretta) knee injection x1 (10/06/2021) (DKFUA)  Therapeutic right (Monovisc) knee injection x1 (06/07/2021) (100/100/100/75)    Therapeutic  Palliative (PRN) options:   Therapeutic/palliative lumbar facet RFA  Therapeutic/palliative lumbar facet MBB  Therapeutic left L2 TFESI (this level is responsible for her low back pain and left lower extremity pain.)   Pharmacotherapy  Nonopioids transfer 12/21/2020: Gabapentin       Recent Visits Date Type Provider Dept  05/22/23 Office Visit [name], MD [facility]  04/26/23 Procedure visit [name], MD [facility]  04/25/23 Office Visit [name], MD [facility]  04/12/23 Procedure visit [name], MD [facility]  03/15/23 Office Visit [name], MD [facility]  Showing recent visits within past 90 days and meeting all other requirements Today's Visits Date Type Provider Dept  06/05/23 Procedure visit [name], MD [facility]  Showing today's visits and meeting all other requirements Future Appointments Date Type Provider Dept  06/19/23 Appointment [name], MD [facility]  06/25/23 Appointment [name], MD [facility]  Showing future appointments within next 90 days and meeting all other requirements  I discussed the assessment and treatment plan with the patient. The patient was provided an opportunity to ask questions and all were answered. The patient agreed with the plan and demonstrated an understanding of the instructions.  Patient advised to call back or seek  an in-person evaluation if the symptoms or condition worsens.  Duration of encounter: 30 minutes.  Total time on encounter, as per AMA guidelines included both the face-to-face and non-face-to-face time personally spent by the physician and/or other qualified health care professional(s) on the day of the encounter (includes time in activities that require the physician or other qualified health care professional and does not include time in activities normally performed by clinical staff). Physician's time may include the following activities when performed: Preparing to see the patient (e.g., pre-charting review of records, searching for previously ordered imaging, lab work, and nerve conduction tests) Review of prior analgesic pharmacotherapies. Reviewing PMP Interpreting ordered tests (e.g., lab work, imaging, nerve conduction tests) Performing post-procedure evaluations, including interpretation of diagnostic procedures Obtaining and/or reviewing separately obtained history Performing a medically appropriate examination and/or evaluation Counseling and educating the patient/family/caregiver Ordering medications, tests, or procedures  Referring and communicating with other health care professionals (when not separately reported) Documenting clinical information in the electronic or other health record Independently interpreting results (not separately reported) and communicating results to the patient/ family/caregiver Care coordination (not separately reported)  Note by: Oswaldo Done, MD Date: 06/05/2023; Time: 1:48 PM

## 2023-06-05 ENCOUNTER — Encounter: Payer: Self-pay | Admitting: Pain Medicine

## 2023-06-05 ENCOUNTER — Ambulatory Visit
Admission: RE | Admit: 2023-06-05 | Discharge: 2023-06-05 | Disposition: A | Discharge status: Home / Self Care | Payer: Medicare HMO | Source: Ambulatory Visit | Attending: Pain Medicine | Admitting: Pain Medicine

## 2023-06-05 ENCOUNTER — Ambulatory Visit: Payer: Medicare HMO | Attending: Pain Medicine | Admitting: Pain Medicine

## 2023-06-05 VITALS — BP 151/82 | HR 60 | Temp 97.3°F | Resp 17 | Ht 64.0 in | Wt 175.0 lb

## 2023-06-05 DIAGNOSIS — M25511 Pain in right shoulder: Secondary | ICD-10-CM | POA: Insufficient documentation

## 2023-06-05 DIAGNOSIS — G8929 Other chronic pain: Secondary | ICD-10-CM | POA: Insufficient documentation

## 2023-06-05 DIAGNOSIS — Z09 Encounter for follow-up examination after completed treatment for conditions other than malignant neoplasm: Secondary | ICD-10-CM | POA: Diagnosis not present

## 2023-06-05 DIAGNOSIS — M19011 Primary osteoarthritis, right shoulder: Secondary | ICD-10-CM | POA: Diagnosis not present

## 2023-06-05 DIAGNOSIS — M545 Low back pain, unspecified: Secondary | ICD-10-CM

## 2023-06-05 MED ORDER — ROPIVACAINE HCL 2 MG/ML IJ SOLN
INTRAMUSCULAR | Status: AC
  Filled 2023-06-05: qty 20

## 2023-06-05 MED ORDER — METHYLPREDNISOLONE ACETATE 80 MG/ML IJ SUSP
INTRAMUSCULAR | Status: AC
  Filled 2023-06-05: qty 1

## 2023-06-05 MED ORDER — LIDOCAINE HCL 2 % IJ SOLN
20.0000 mL | Freq: Once | INTRAMUSCULAR | Status: AC
  Administered 2023-06-05: 400 mg

## 2023-06-05 MED ORDER — METHYLPREDNISOLONE ACETATE 80 MG/ML IJ SUSP
80.0000 mg | Freq: Once | INTRAMUSCULAR | Status: AC
  Administered 2023-06-05: 80 mg via INTRA_ARTICULAR

## 2023-06-05 MED ORDER — PENTAFLUOROPROP-TETRAFLUOROETH EX AERO
INHALATION_SPRAY | Freq: Once | CUTANEOUS | Status: AC
  Administered 2023-06-05: 30 via TOPICAL
  Filled 2023-06-05: qty 116

## 2023-06-05 MED ORDER — LIDOCAINE HCL 2 % IJ SOLN
INTRAMUSCULAR | Status: AC
  Filled 2023-06-05: qty 20

## 2023-06-05 MED ORDER — ROPIVACAINE HCL 2 MG/ML IJ SOLN
9.0000 mL | Freq: Once | INTRAMUSCULAR | Status: AC
  Administered 2023-06-05: 9 mL via INTRA_ARTICULAR

## 2023-06-05 NOTE — Progress Notes (Signed)
PROVIDER NOTE: Interpretation of information contained herein should be left to medically-trained personnel. Specific patient instructions are provided elsewhere under "Patient Instructions" section of medical record. This document was created in part using STT-dictation technology, any transcriptional errors that may result from this process are unintentional.  Patient: Wanda Martinez Type: Established DOB: Apr 16, 1935 MRN: [id number] PCP: Dale [place], MD  Service: Procedure DOS: 06/05/2023 Setting: Ambulatory Location: Ambulatory outpatient facility Delivery: Face-to-face Provider: Oswaldo Done, MD Specialty: Interventional Pain Management Specialty designation: 09 Location: Outpatient facility Ref. Prov.: Dale [place], MD       Interventional Therapy   Procedure: Suprascapular nerve block (SSNB) #1  Laterality:  Right  Level: Superior to scapular spine, lateral to supraspinatus fossa (Suprascapular notch).  Imaging: Fluoroscopic guidance         Anesthesia: Local anesthesia (1-2% Lidocaine) Anxiolysis: None                 Sedation: No Sedation                       DOS: 06/05/2023  Performed by: Oswaldo Done, MD  Purpose: Diagnostic/Therapeutic Indications: Shoulder pain, severe enough to impact quality of life and/or function. 1. Chronic shoulder pain (Right)   2. Osteoarthritis of AC (acromioclavicular) joint (Right)   3. Osteoarthritis of shoulder (Right)   4. Trigger point of shoulder region (Right)    NAS-11 score:   Pre-procedure: 6 /10   Post-procedure: 0-No pain/10   Note: The patient came into the clinic today under the impression that she was going to have a right-sided L4 and L5 transforaminal epidural steroid injections.  However, her insurance company has not answered our request for preapproval and therefore we will were unable to proceed with the original plan.  Upon informing the patient, she indicated that she was having excruciating  pain in the area of the shoulder and suprascapular region.  Evaluation revealed the patient to be having excruciating pain to palpation over the suprascapular muscle.  Based on the patient's history and physical exam, we recommended a suprascapular nerve block.  She indicated that she wanted to have that done today if at all possible.  We checked with the patient's insurance and we were informed that she would not need preapproval for this.  In view of this, we went ahead and made the arrangements to proceed with it today.    Target: Suprascapular nerve Location: midway between the medial border of the scapula and the acromion as it runs through the suprascapular notch. Region: Suprascapular, posterior shoulder  Approach: Percutaneous  Neuroanatomy: The suprascapular nerve is the lateral branch of the superior trunk of the brachial plexus. It receives nerve fibers that originate in the nerve roots C5 and C6 (and sometimes C4). It is a mixed nerve, meaning that it provides both sensory and motor supply for the suprascapular region. Function: The main function of this nerve is to provide motor innervation for two muscles, the supraspinatus and infraspinatus muscles. They are part of the rotator cuff muscles. In addition, the suprascapular nerve provides a sensory supply to the joints of the scapula (glenohumeral and acromioclavicular joints). Rationale (medical necessity): procedure needed and proper for the diagnosis and/or treatment of the patient's medical symptoms and needs.  Position / Prep / Materials:  Position: Prone Materials:  Tray: Block Needle(s):  Type: Spinal  Gauge (G): 22  Length: 3.5 in.  Qty: 1 Prep solution: DuraPrep (Iodine Povacrylex [0.7% available iodine] and Isopropyl Alcohol,  74% w/w) Prep Area: Entire posterior shoulder area. From upper spine to shoulder proper (upper arm), and from lateral neck to lower tip of shoulder blade.   Pre-op H&P Assessment:  Wanda Martinez is a  88 y.o. (year old), female patient, seen today for interventional treatment. She  has a past surgical history that includes Tonsillectomy and adenoidectomy (1979); Vesicovaginal fistula closure w/ TAH (1983); Breast surgery (1986); Breast enhancement surgery (1987); Breast implant removal; Breast implant removal (Right, 08/29/2012); Mastectomy (08/2012); Abdominal hysterectomy; Colonoscopy with propofol (N/A, 09/13/2016); Breast biopsy (2013); and Blepharoplasty. Wanda Martinez has a current medication list which includes the following prescription(s): amlodipine, aspirin ec, calcium carbonate, citalopram, cyclobenzaprine, donepezil, erythromycin, gabapentin, glucosamine-chondroitin, hydrocodone-acetaminophen, hydrocodone-acetaminophen, hydrocodone-acetaminophen, hydrocortisone, letrozole, losartan, lubiprostone, magnesium oxide, melatonin, metoprolol succinate, mirabegron er, multivitamin, naloxone, nitrofurantoin (macrocrystal-monohydrate), omega-3, oxycodone, pantoprazole, and rosuvastatin. Her primarily concern today is the Shoulder Pain (right)  Initial Vital Signs:  Pulse/HCG Rate: (!) 49  Temp: (!) 97.3 F (36.3 C) Resp: 16 BP: 129/72 SpO2: 100 %  BMI: Estimated body mass index is 30.04 kg/m as calculated from the following:   Height as of this encounter: 5\' 4"  (1.626 m).   Weight as of this encounter: 175 lb (79.4 kg).  Risk Assessment: Allergies: Reviewed. She is allergic to sulfa antibiotics and vesicare [solifenacin].  Allergy Precautions: None required Coagulopathies: Reviewed. None identified.  Blood-thinner therapy: None at this time Active Infection(s): Reviewed. None identified. Wanda Martinez is afebrile  Site Confirmation: Wanda Martinez was asked to confirm the procedure and laterality before marking the site Procedure checklist: Completed Consent: Before the procedure and under the influence of no sedative(s), amnesic(s), or anxiolytics, the patient was informed of the treatment  options, risks and possible complications. To fulfill our ethical and legal obligations, as recommended by the American Medical Association's Code of Ethics, I have informed the patient of my clinical impression; the nature and purpose of the treatment or procedure; the risks, benefits, and possible complications of the intervention; the alternatives, including doing nothing; the risk(s) and benefit(s) of the alternative treatment(s) or procedure(s); and the risk(s) and benefit(s) of doing nothing. The patient was provided information about the general risks and possible complications associated with the procedure. These may include, but are not limited to: failure to achieve desired goals, infection, bleeding, organ or nerve damage, allergic reactions, paralysis, and death. In addition, the patient was informed of those risks and complications associated to the procedure, such as failure to decrease pain; infection; bleeding; organ or nerve damage with subsequent damage to sensory, motor, and/or autonomic systems, resulting in permanent pain, numbness, and/or weakness of one or several areas of the body; allergic reactions; (i.e.: anaphylactic reaction); and/or death. Furthermore, the patient was informed of those risks and complications associated with the medications. These include, but are not limited to: allergic reactions (i.e.: anaphylactic or anaphylactoid reaction(s)); adrenal axis suppression; blood sugar elevation that in diabetics may result in ketoacidosis or comma; water retention that in patients with history of congestive heart failure may result in shortness of breath, pulmonary edema, and decompensation with resultant heart failure; weight gain; swelling or edema; medication-induced neural toxicity; particulate matter embolism and blood vessel occlusion with resultant organ, and/or nervous system infarction; and/or aseptic necrosis of one or more joints. Finally, the patient was informed that  Medicine is not an exact science; therefore, there is also the possibility of unforeseen or unpredictable risks and/or possible complications that may result in a catastrophic outcome. The patient indicated having understood very clearly.  We have given the patient no guarantees and we have made no promises. Enough time was given to the patient to ask questions, all of which were answered to the patient's satisfaction. Wanda Martinez has indicated that she wanted to continue with the procedure. Attestation: I, the ordering provider, attest that I have discussed with the patient the benefits, risks, side-effects, alternatives, likelihood of achieving goals, and potential problems during recovery for the procedure that I have provided informed consent. Date  Time: 06/05/2023 12:45 PM  Pre-Procedure Preparation:  Monitoring: As per clinic protocol. Respiration, ETCO2, SpO2, BP, heart rate and rhythm monitor placed and checked for adequate function Safety Precautions: Patient was assessed for positional comfort and pressure points before starting the procedure. Time-out: I initiated and conducted the "Time-out" before starting the procedure, as per protocol. The patient was asked to participate by confirming the accuracy of the "Time Out" information. Verification of the correct person, site, and procedure were performed and confirmed by me, the nursing staff, and the patient. "Time-out" conducted as per Joint Commission's Universal Protocol (UP.01.01.01). Time: 1317 Start Time: 1317 hrs.  Description of Procedure:          Procedural Technique Safety Precautions: Aspiration looking for blood return was conducted prior to all injections. At no point did we inject any substances, as a needle was being advanced. No attempts were made at seeking any paresthesias. Safe injection practices and needle disposal techniques used. Medications properly checked for expiration dates. SDV (single dose vial) medications  used. Description of the Procedure: Protocol guidelines were followed. The patient was placed in position over the procedure table. The target area was identified and the area prepped in the usual manner. Skin & deeper tissues infiltrated with local anesthetic. Appropriate amount of time allowed to pass for local anesthetics to take effect. The procedure needles were then advanced to the target area. Proper needle placement secured. Negative aspiration confirmed. Solution injected in intermittent fashion, asking for systemic symptoms every 0.5cc of injectate. The needles were then removed and the area cleansed, making sure to leave some of the prepping solution back to take advantage of its long term bactericidal properties.  Vitals:   06/05/23 1243 06/05/23 1315 06/05/23 1323  BP: 129/72 (!) 149/83 (!) 151/82  Pulse: (!) 49 (!) 58 60  Resp:  16 17  Temp: (!) 97.3 F (36.3 C)    TempSrc: Temporal    SpO2: 100% 99% 99%  Weight: 175 lb (79.4 kg)    Height: 5\' 4"  (1.626 m)       Start Time: 1317 hrs. End Time: 1320 hrs.  Imaging Guidance (Spinal):          Type of Imaging Technique: Fluoroscopy Guidance (Spinal) Indication(s): Assistance in needle guidance and placement for procedures requiring needle placement in or near specific anatomical locations not easily accessible without such assistance. Exposure Time: Please see nurses notes. Contrast: None used. Fluoroscopic Guidance: I was personally present during the use of fluoroscopy. "Tunnel Vision Technique" used to obtain the best possible view of the target area. Parallax error corrected before commencing the procedure. "Direction-depth-direction" technique used to introduce the needle under continuous pulsed fluoroscopy. Once target was reached, antero-posterior, oblique, and lateral fluoroscopic projection used confirm needle placement in all planes. Images permanently stored in EMR. Interpretation: No contrast injected. I personally  interpreted the imaging intraoperatively. Adequate needle placement confirmed in multiple planes. Permanent images saved into the patient's record.  Antibiotic Prophylaxis:   Anti-infectives (From admission, onward)  None      Indication(s): None identified  Post-operative Assessment:  Post-procedure Vital Signs:  Pulse/HCG Rate: 60  Temp: (!) 97.3 F (36.3 C) Resp: 17 BP: (!) 151/82 SpO2: 99 %  EBL: None  Complications: No immediate post-treatment complications observed by team, or reported by patient.  Note: The patient tolerated the entire procedure well. A repeat set of vitals were taken after the procedure and the patient was kept under observation following institutional policy, for this type of procedure. Post-procedural neurological assessment was performed, showing return to baseline, prior to discharge. The patient was provided with post-procedure discharge instructions, including a section on how to identify potential problems. Should any problems arise concerning this procedure, the patient was given instructions to immediately contact us, at any time, without hesitation. In any case, we plan to contact the patient by telephone for a follow-up status report regarding this interventional procedure.  Comments:  No additional relevant information.  Plan of Care (POC)  Orders:  Orders Placed This Encounter  Procedures   SHOULDER INJECTION    Scheduling Instructions:     Procedure: Intra-articular shoulder (Glenohumeral) joint and (AC) Acromioclavicular joint injection     Side: Right-sided     Level: Glenohumeral joint and (AC) Acromioclavicular joint     Sedation: Patient's choice.     Timeframe: Today    Order Specific Question:   Where will this procedure be performed?    Answer:   [facility]   MR SHOULDER RIGHT WO CONTRAST    Standing Status:   Future    Standing Expiration Date:   07/05/2023    Scheduling Instructions:     Please make sure that  the patient understands that this needs to be done as soon as possible. Never have the patient do the imaging "just before the next appointment". Inform patient that having the imaging done within the [facility] will expedite the availability of the results and will provide      imaging availability to the requesting physician. In addition inform the patient that the imaging order has an expiration date and will not be renewed if not done within the active period.    Order Specific Question:   What is the patient's sedation requirement?    Answer:   No Sedation    Order Specific Question:   Does the patient have a pacemaker or implanted devices?    Answer:   No    Order Specific Question:   Preferred imaging location?    Answer:   [facility] (table limit-350lbs)    Order Specific Question:   Call Results- Best Contact Number?    Answer:   [phone] [facility])    Order Specific Question:   Radiology Contrast Protocol - do NOT remove file path    Answer:   [email or web address]   DG PAIN CLINIC C-ARM 1-60 MIN NO REPORT    Intraoperative interpretation by procedural physician at [facility].    Standing Status:   Standing    Number of Occurrences:   1    Order Specific Question:   Reason for exam:    Answer:   Assistance in needle guidance and placement for procedures requiring needle placement in or near specific anatomical locations not easily accessible without such assistance.   Nursing Instructions:    Please complete this patient's postprocedure evaluation.    Scheduling Instructions:     Please complete this patient's postprocedure evaluation.   Informed Consent Details: Physician/Practitioner Attestation;  Transcribe to consent form and obtain patient signature    Note: Always confirm laterality of pain with [name], before procedure.    Order Specific Question:   Physician/Practitioner attestation of informed consent for  procedure/surgical case    Answer:   I, the physician/practitioner, attest that I have discussed with the patient the benefits, risks, side effects, alternatives, likelihood of achieving goals and potential problems during recovery for the procedure that I have provided informed consent.    Order Specific Question:   Procedure    Answer:   Intra-articular shoulder joint injection under fluoroscopic guidance    Order Specific Question:   Physician/Practitioner performing the procedure    Answer:   Ketzaly Cardella A. Laban Emperor, MD    Order Specific Question:   Indication/Reason    Answer:   Chronic shoulder pain secondary to shoulder arthropathy   Provide equipment / supplies at bedside    Procedure tray: "Block Tray" (Disposable  single use) Skin infiltration needle: Regular 1.5-in, 25-G, (x1) Block Needle type: Spinal Amount/quantity: 1 Size: Regular (3.5-inch) Gauge: 22G    Standing Status:   Standing    Number of Occurrences:   1    Order Specific Question:   Specify    Answer:   Block Tray   Chronic Opioid Analgesic:  Hydrocodone/APAP 5/325, 1 tab PO q 8 hrs (15 mg/day of hydrocodone) MME/day: 15 mg/day.   Medications ordered for procedure: Meds ordered this encounter  Medications   lidocaine (XYLOCAINE) 2 % (with pres) injection 400 mg   pentafluoroprop-tetrafluoroeth (GEBAUERS) aerosol   methylPREDNISolone acetate (DEPO-MEDROL) injection 80 mg   ropivacaine (PF) 2 mg/mL (0.2%) (NAROPIN) injection 9 mL   Medications administered: We administered lidocaine, pentafluoroprop-tetrafluoroeth, methylPREDNISolone acetate, and ropivacaine (PF) 2 mg/mL (0.2%).  See the medical record for exact dosing, route, and time of administration.  Follow-up plan:   Return in about 2 weeks (around 06/19/2023) for Proc-day (T,Th), (Face2F), (PPE).       Interventional Therapies  Risk Factors  Considerations:   Parkinson's disease  Hx right breast cancer  OIC  SOB  CKD Stage 3  GERD  depression   osteopenia/osteoporosis     Planned  Pending:   Therapeutic/palliative right IA knee Zilretta knee injection #2    Under consideration:   Therapeutic right SI joint injection #4  Diagnostic right genicular NB #1  Diagnostic bilateral cervical facet MBB  Therapeutic right L3 and L4 TFESI #1    Completed:   Therapeutic right superior/lateral trapezius muscle TPI x1 (04/19/2021)  Therapeutic right thoracic back TPI x2 (10/17/2018) (DKFUA)  Therapeutic left thoracic back TPI x1 (10/17/2018) (DKFUA)  Therapeutic right quadratus lumborum and erector spinae muscle TPI x1 (02/12/2018)  Therapeutic bilateral lumbar facet MBB (L2-S1) (FCT: L3-4,L4-5,L5-S1) x5 (08/09/2021) (100/100/50)  Therapeutic bilateral lumbar facet MBB (L4-S1) (FCT: L5-S1) x1 (08/29/2022) (100/100/100 x3 days/R:0L:100)  Therapeutic right lumbar facet MBB (L2-S1) x4 (12/21/2020) (DKFUA)  Therapeutic right lumbar facet RFA (L2-S1) x3 (09/28/2022) (100/100/75/75)  Therapeutic right lumbar facet RFA (L3-S1) x1 (03/01/2023) (100/100/70/70)  Therapeutic left lumbar facet RFA x2 (11/30/2022) (100/60/90/>75)  Diagnostic right SI joint Blk x3 (12/21/2020)  (DKFUA)  Diagnostic left SI joint Blk x2 (04/12/2023) (100/90/20/<25)  Therapeutic right SI joint RFA x4 (03/01/2023) (100/100/70/70)  Therapeutic midline L2-3 LESI x1 (10/04/2017) (100/100/85/>50)  Therapeutic right L3-4 LESI x2 (11/04/2020) (100/100/20/<50)  Therapeutic right L4-5 LESI x2 (03/30/2022) (100/100/75/75)  Therapeutic right L5-S1 LESI x2 (12/04/2019) (100/100/50/75)  Diagnostic/therapeutic right L3 and L4 TFESI x1 (07/27/2022) (100/100/75/50)  Therapeutic right  L5 TFESI x4 (02/14/2022) (100/100/70/75)  Therapeutic right S1 TFESI x1 (11/04/2020) (100/100/20/<50)  Diagnostic bilateral superficial trochanteric bursa injec. x1 (04/30/2018) [facility])  Diagnostic right IA hip injection x1 (02/13/2019) (DKFUA)  Diagnostic/therapeutic right IA steroid knee inj. x4 (01/10/2023)  (100/100/100/80)  Therapeutic right (Zilretta) knee injection x1 (10/06/2021) (DKFUA)  Therapeutic right (Monovisc) knee injection x1 (06/07/2021) (100/100/100/75)    Therapeutic  Palliative (PRN) options:   Therapeutic/palliative lumbar facet RFA  Therapeutic/palliative lumbar facet MBB  Therapeutic left L2 TFESI (this level is responsible for her low back pain and left lower extremity pain.)   Pharmacotherapy  Nonopioids transfer 12/21/2020: Gabapentin        Recent Visits Date Type Provider Dept  05/22/23 Office Visit [name], MD [facility]  04/26/23 Procedure visit [name], MD [facility]  04/25/23 Office Visit [name], MD [facility]  04/12/23 Procedure visit [name], MD [facility]  03/15/23 Office Visit [name], MD [facility]  Showing recent visits within past 90 days and meeting all other requirements Today's Visits Date Type Provider Dept  06/05/23 Procedure visit [name], MD [facility]  Showing today's visits and meeting all other requirements Future Appointments Date Type Provider Dept  06/19/23 Appointment [name], MD [facility]  06/25/23 Appointment [name], MD [facility]  Showing future appointments within next 90 days and meeting all other requirements  Disposition: Discharge home  Discharge (Date  Time): 06/05/2023; 1324 hrs.   Primary Care Physician: Dale Musselshell, MD Location: [facility] Note by: Oswaldo Done, MD (TTS technology used. I apologize for any typographical errors that were not detected and corrected.) Date: 06/05/2023; Time: 1:51 PM  Disclaimer:  Medicine is not an Visual merchandiser. The only guarantee in medicine is that nothing is guaranteed. It is important to note that the decision to proceed with this intervention was based on the  information collected from the patient. The Data and conclusions were drawn from the patient's questionnaire, the interview, and the physical examination. Because the information was provided in large part by the patient, it cannot be guaranteed that it has not been purposely or unconsciously manipulated. Every effort has been made to obtain as much relevant data as possible for this evaluation. It is important to note that the conclusions that lead to this procedure are derived in large part from the available data. Always take into account that the treatment will also be dependent on availability of resources and existing treatment guidelines, considered by other Pain Management Practitioners as being common knowledge and practice, at the time of the intervention. For Medico-Legal purposes, it is also important to point out that variation in procedural techniques and pharmacological choices are the acceptable norm. The indications, contraindications, technique, and results of the above procedure should only be interpreted and judged by a Board-Certified Interventional Pain Specialist with extensive familiarity and expertise in the same exact procedure and technique.

## 2023-06-05 NOTE — Patient Instructions (Signed)

## 2023-06-06 ENCOUNTER — Telehealth: Payer: Self-pay

## 2023-06-06 NOTE — Telephone Encounter (Signed)
Post procedure follow up.  Patient states she is doing good.  

## 2023-06-12 ENCOUNTER — Encounter: Payer: Self-pay | Admitting: Oncology

## 2023-06-14 ENCOUNTER — Ambulatory Visit
Admission: RE | Admit: 2023-06-14 | Discharge: 2023-06-14 | Disposition: A | Discharge status: Home / Self Care | Payer: Medicare HMO | Source: Ambulatory Visit | Attending: Pain Medicine | Admitting: Pain Medicine

## 2023-06-14 DIAGNOSIS — M19011 Primary osteoarthritis, right shoulder: Secondary | ICD-10-CM | POA: Insufficient documentation

## 2023-06-14 DIAGNOSIS — M25511 Pain in right shoulder: Secondary | ICD-10-CM | POA: Insufficient documentation

## 2023-06-14 DIAGNOSIS — G8929 Other chronic pain: Secondary | ICD-10-CM | POA: Insufficient documentation

## 2023-06-14 DIAGNOSIS — M25411 Effusion, right shoulder: Secondary | ICD-10-CM | POA: Diagnosis not present

## 2023-06-14 DIAGNOSIS — S46811A Strain of other muscles, fascia and tendons at shoulder and upper arm level, right arm, initial encounter: Secondary | ICD-10-CM | POA: Diagnosis not present

## 2023-06-14 DIAGNOSIS — M7581 Other shoulder lesions, right shoulder: Secondary | ICD-10-CM | POA: Diagnosis not present

## 2023-06-18 NOTE — Progress Notes (Signed)
PROVIDER NOTE: Information contained herein reflects review and annotations entered in association with encounter. Interpretation of such information and data should be left to medically-trained personnel. Information provided to patient can be located elsewhere in the medical record under "Patient Instructions". Document created using STT-dictation technology, any transcriptional errors that may result from process are unintentional.    Patient: Wanda Martinez  Service Category: E/M  Provider: Oswaldo Done, MD  DOB: 04/09/1935  DOS: 06/19/2023  Referring Provider: Dale [place], MD  MRN: [id number]  Specialty: Interventional Pain Management  PCP: Dale Griggs, MD  Type: Established Patient  Setting: Ambulatory outpatient    Location: Office  Delivery: Face-to-face     HPI  Wanda Martinez, a 88 y.o. year old female, is here today because of her Chronic right shoulder pain [M25.511, G89.29]. Wanda Martinez primary complain today is Back Pain (lower)  Pertinent problems: Wanda Martinez has Headache; Parkinson's disease; Chronic low back pain (1ry area of Pain) (Bilateral) (R>L) w/o sciatica; Lumbar spondylosis; Chronic hip pain (Bilateral); Chronic neck pain; Cervical spondylosis; Chronic cervical radicular pain (Right); Diffuse myofascial pain syndrome; Neurogenic pain; Chronic upper back pain (Right); Myofascial pain syndrome (Right) (cervicothoracic); Lumbar facet syndrome (Bilateral) (R>L); History of breast cancer; Cervical facet hypertrophy; Cervical facet syndrome (HCC); Chronic shoulder pain (Right); Chronic pain syndrome; Chronic sacroiliac joint pain (Left); Chronic sacroiliac joint pain (Right); 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, L4-5, L5-S1) (Right); Lumbar spinal stenosis (with neurogenic claudication) (L3-4); Chronic lower extremity pain (Right); Chronic lumbar radicular pain (S1) (Right); Trochanteric bursitis of hip (Bilateral); Spondylosis without myelopathy or  radiculopathy, lumbosacral region; Trigger point with back pain (Right); DDD (degenerative disc disease), lumbosacral; Chronic upper extremity pain (Right); Chronic thoracic back pain (Bilateral) (L>R); Trigger point of thoracic region (Bilateral) (L>R); Other specified dorsopathies, sacral and sacrococcygeal region; Sacroiliac joint dysfunction (Right); Osteoarthritis of sacroiliac joint (Right); Somatic dysfunction of sacroiliac joint (Right); Chronic musculoskeletal pain; Facial pain; Chronic hip pain (Right); Osteoarthritis of hip (Right); Left ear pain; 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HCC); Abnormal MRI, lumbar spine (08/23/2022); Osteoarthritis involving multiple joints; Other spondylosis, sacral and sacrococcygeal region; Lumbar facet hypertrophy (Multilevel) (Bilateral); Chronic knee pain (Right); Osteoarthritis of knee (Right); Trigger point of shoulder region (Right); Osteoarthritis of AC (acromioclavicular) joint (Right); Osteoarthritis of shoulder (Right); Unspecified injury of muscle(s) and tendon(s) of the rotator cuff of shoulder, sequela (Right); Cervicalgia; DDD (degenerative disc disease), lumbar; Lumbar radicular pain (Bilateral); Lumbar foraminal stenosis (Right: L3-4, L4-5, L5-S1) (Left: L2-3); Abnormal MRI, hip (Right) (05/04/2022); Abnormal MRI, knee (01/04/2022); Tricompartment osteoarthritis of knee (Right); Chronic low back pain (Right) w/o sciatica; Hoffa's fat pad disease (HCC) (Right); Hoffa's pad syndrome (HCC) (Right); Chronic low back pain (Right) w/ sciatica (Right); Lower extremity weakness (Right); Chronic radicular pain of lower back; Chronic sciatica (Right); Lumbosacral radiculopathy at L5 (Right); and Low back pain of over 3 months duration on their pertinent problem list. Pain Assessment: Severity of Chronic pain is reported as a 8 /10. Location: Back Left, Right/Denies. Onset: More than a month ago. Quality: Aching, Burning, Constant, Pressure, Throbbing. Timing: Constant.  Modifying factor(s): laying down, meds and procedure. Vitals:  height is 5\' 4"  (1.626 m) and weight is 173 lb (78.5 kg). Her temperature is 97.5 F (36.4 C) (abnormal). Her blood pressure is 127/57 (abnormal) and her pulse is 51 (abnormal). Her oxygen saturation is 100%.  BMI: Estimated body mass index is 29.7 kg/m as calculated from the following:   Height as of this encounter: 5\' 4"  (1.626 m).  Weight as of this encounter: 173 lb (78.5 kg). Last encounter: 05/22/2023. Last procedure: 06/05/2023.  Reason for encounter: post-procedure evaluation and assessment.  The patient indicates having attained 100% relief of the pain for the duration of the local anesthetic followed by a decrease to an ongoing 50% improvement of her shoulder pain.  At this point she wants because to shift our attention to her lower back.  She denies any lower extremity pain, numbness or weakness.  According to her last MRI she does seem to have some endplate marrow edema at T12-L1 and L2-3 levels.  She does seem to have some right foraminal stenosis at the L5-S1 level and left foraminal stenosis at the L2-3 level with potential for exiting nerve root compression at those levels.  Clinically the patient indicates having worse pain on the left lower back which would correspond to the left L2 and she indicates having some calf discomfort on the right side which may suggest a mild issue with the right L5.  For this reason, I believe that the patient would benefit from a left L2 transforaminal ESI as well as a right L5-S1 TFESI.  Ideally we should also go to the T12-L1 level using an interlaminar approach, but unfortunately current baseless Medicare rules do not allow for appropriate treatment of complicated condition such as this patient's.  Today the patient indicates having received a letter indicating that her procedure for the lower back had been approved.  Unfortunately, she did not bring it to the clinic and the last information  that we have from her insurance is that they had not approved anything yet.  Today we have requested for the patient to bring that later in so that we can copy it and save it for her records.  Tentatively, I will be entering orders for the patient to have that right L5 and left L2 transforaminal epidural steroid injections.  (08/23/2022) LUMBAR MRI FINDINGS: Alignment: Dextroscoliosis. Vertebrae: Degenerative endplate irregularity. There is degenerative endplate marrow edema at T12-L1 greater than L2-3.  DISC LEVELS:  Mild disc bulges at included lower thoracic levels. L1-2:  Disc bulge. L2-3: Disc bulge with endplate osteophytic ridging eccentric to the left. (L>R) facet arthropathy with ligamentum flavum infolding. Moderate left foraminal stenosis. L3-4: Disc bulge with endplate osteophytic ridging eccentric to the left. Facet arthropathy with ligamentum flavum infolding. Slight effacement of right subarticular recess. Mild left foraminal stenosis. L4-5: Disc bulge with endplate osteophytic ridging eccentric to the right. (R>L) facet arthropathy with ligamentum flavum infolding. Mild foraminal stenosis. L5-S1: Disc bulge with endplate osteophytic ridging eccentric to the right. (R>L) facet arthropathy. Moderate right foraminal stenosis.  IMPRESSION: Multilevel degenerative changes as detailed above are similar to the 2021 examination. There is no high-grade canal stenosis. Right foraminal narrowing is greatest at L5-S1 and left foraminal narrowing is greatest at L2-3 with potential for exiting nerve root compression at these levels.  Today the patient has indicated really helping to be able to come in as soon as possible for treatment of her lower back.  Post-procedure evaluation   Procedure: Suprascapular nerve block (SSNB) #1  Laterality:  Right  Level: Superior to scapular spine, lateral to supraspinatus fossa (Suprascapular notch).  Imaging: Fluoroscopic guidance         Anesthesia:  Local anesthesia (1-2% Lidocaine) Anxiolysis: None                 Sedation: No Sedation  DOS: 06/05/2023  Performed by: Oswaldo Done, MD  Purpose: Diagnostic/Therapeutic Indications: Shoulder pain, severe enough to impact quality of life and/or function. 1. Chronic shoulder pain (Right)   2. Osteoarthritis of AC (acromioclavicular) joint (Right)   3. Osteoarthritis of shoulder (Right)   4. Trigger point of shoulder region (Right)    NAS-11 score:   Pre-procedure: 6 /10   Post-procedure: 0-No pain/10   Note: The patient came into the clinic today under the impression that she was going to have a right-sided L4 and L5 transforaminal epidural steroid injections.  However, her insurance company has not answered our request for preapproval and therefore we will were unable to proceed with the original plan.  Upon informing the patient, she indicated that she was having excruciating pain in the area of the shoulder and suprascapular region.  Evaluation revealed the patient to be having excruciating pain to palpation over the suprascapular muscle.  Based on the patient's history and physical exam, we recommended a suprascapular nerve block.  She indicated that she wanted to have that done today if at all possible.  We checked with the patient's insurance and we were informed that she would not need preapproval for this.  In view of this, we went ahead and made the arrangements to proceed with it today.     Effectiveness:  Initial hour after procedure: 100 %. Subsequent 4-6 hours post-procedure: 100 %. Analgesia past initial 6 hours: 50 %. Ongoing improvement:  Analgesic: The patient indicates having attained 100% relief of the pain in her right shoulder for the duration of the local anesthetic followed by a decrease to an ongoing 50% benefit after her right suprascapular nerve block. Function: Wanda Martinez reports improvement in function ROM: Wanda Martinez reports  improvement in ROM  Pharmacotherapy Assessment  Analgesic: Hydrocodone/APAP 5/325, 1 tab PO q 8 hrs (15 mg/day of hydrocodone) MME/day: 15 mg/day.   Monitoring: [place] PMP: PDMP reviewed during this encounter.       Pharmacotherapy: No side-effects or adverse reactions reported. Compliance: No problems identified. Effectiveness: Clinically acceptable.  No notes on file  No results found for: "CBDTHCR" No results found for: "D8THCCBX" No results found for: "D9THCCBX"  UDS:  Summary  Date Value Ref Range Status  06/12/2022 Note  Final    Comment:    ==================================================================== [employer] [facility] 13 (MW) ==================================================================== Test                             Result       Flag       Units  Drug Present and Declared for Prescription Verification   Hydrocodone                    1766         EXPECTED   ng/mg creat   Dihydrocodeine                 303          EXPECTED   ng/mg creat   Norhydrocodone                 1597         EXPECTED   ng/mg creat    Sources of hydrocodone include scheduled prescription medications.    Dihydrocodeine and norhydrocodone are expected metabolites of    hydrocodone. Dihydrocodeine is also available as a scheduled    prescription medication.  ==================================================================== Test  Result    Flag   Units      Ref Range   Creatinine              38               mg/dL      >=40 ==================================================================== Declared Medications:  The flagging and interpretation on this report are based on the  following declared medications.  Unexpected results may arise from  inaccuracies in the declared medications.   **Note: The testing scope of this panel includes these medications:   Hydrocodone (Norco)   **Note: The testing scope of this panel does not include the  following  reported medications:   Acetaminophen (Norco)  Amlodipine  Aspirin  Calcium  Chondroitin  Citalopram (Celexa)  Cyclobenzaprine (Flexeril)  Donepezil (Aricept)  Gabapentin (Neurontin)  Glucosamine  Letrozole (Femara)  Losartan (Cozaar)  Lubiprostone (Amitiza)  Magnesium (Mag-Ox)  Melatonin  Meloxicam (Mobic)  Metoprolol  Multivitamin  Nitrofurantoin (Macrobid)  Omega-3 Fatty Acids  Omeprazole  Pantoprazole (Protonix)  Rosuvastatin (Crestor) ==================================================================== For clinical consultation, please call [phone]. ====================================================================       ROS  Constitutional: Denies any fever or chills Gastrointestinal: No reported hemesis, hematochezia, vomiting, or acute GI distress Musculoskeletal: Denies any acute onset joint swelling, redness, loss of ROM, or weakness Neurological: No reported episodes of acute onset apraxia, aphasia, dysarthria, agnosia, amnesia, paralysis, loss of coordination, or loss of consciousness  Medication Review  Glucosamine-Chondroitin, HYDROcodone-acetaminophen, Omega-3, amLODipine, aspirin EC, calcium carbonate, citalopram, cyclobenzaprine, donepezil, erythromycin, gabapentin, hydrocortisone, letrozole, losartan, lubiprostone, magnesium oxide, melatonin, metoprolol succinate, mirabegron ER, multivitamin, naloxone, nitrofurantoin (macrocrystal-monohydrate), oxyCODONE, pantoprazole, and rosuvastatin  History Review  Allergy: Wanda Martinez is allergic to sulfa antibiotics and vesicare [solifenacin]. Drug: Wanda Martinez  reports no history of drug use. Alcohol:  reports no history of alcohol use. Tobacco:  reports that she has never smoked. She has never been exposed to tobacco smoke. She has never used smokeless tobacco. Social: Wanda Martinez  reports that she has never smoked. She has never been exposed to tobacco smoke. She has never used smokeless tobacco. She  reports that she does not drink alcohol and does not use drugs. Medical:  has a past medical history of Acute postoperative pain (10/25/2017), Anemia, Arm pain (07/26/2015), Arthritis, Arthritis, degenerative (03/26/2014), Back pain (11/01/2013), Bladder infection (06/2018), Breast cancer (HCC), Breast cancer (HCC), CHEST PAIN (04/29/2010), Chronic cystitis, Cystocele (02/22/2013), Cystocele, midline (08/19/2013), Degeneration of intervertebral disc of lumbosacral region (03/26/2014), DYSPNEA (04/29/2010), Enthesopathy of hip (03/26/2014), GERD (gastroesophageal reflux disease), Hiatal hernia, HTN (hypertension), Hypokalemia (06/2018), Hyponatremia (06/2018), LBP (low back pain) (03/26/2014), Neck pain (11/01/2013), Parkinson disease, Pneumonia (06/2018), Sinusitis (02/07/2015), Skin lesions (07/12/2014), and Urinary incontinence. Surgical: [name]  has a past surgical history that includes Tonsillectomy and adenoidectomy (1979); Vesicovaginal fistula closure w/ TAH (1983); Breast surgery (1986); Breast enhancement surgery (1987); Breast implant removal; Breast implant removal (Right, 08/29/2012); Mastectomy (08/2012); Abdominal hysterectomy; Colonoscopy with propofol (N/A, 09/13/2016); Breast biopsy (2013); and Blepharoplasty. Family: family history includes Colon polyps in her father; Diabetes in her father; Parkinson's disease in her mother; Stroke in her father and mother.  Laboratory Chemistry Profile   Renal Lab Results  Component Value Date   BUN 35 (H) 05/15/2023   CREATININE 1.44 (H) 05/15/2023   BCR 20 06/11/2020   GFR 48.46 (L) 04/09/2023   GFRAA >60 07/27/2020   GFRNONAA 36 (L) 05/15/2023    Hepatic Lab Results  Component Value Date   AST 26 04/09/2023  ALT 18 04/09/2023   ALBUMIN 4.1 04/09/2023   ALKPHOS 30 (L) 04/09/2023    Electrolytes Lab Results  Component Value Date   NA 133 (L) 05/15/2023   K 3.5 05/15/2023   CL 95 (L) 05/15/2023   CALCIUM 9.3 05/15/2023   MG 2.2 07/17/2018     Bone Lab Results  Component Value Date   VD25OH 66.95 09/26/2022   25OHVITD1 42 07/17/2018   25OHVITD2 <1.0 07/17/2018   25OHVITD3 42 07/17/2018    Inflammation (CRP: Acute Phase) (ESR: Chronic Phase) Lab Results  Component Value Date   CRP 3 07/17/2018   ESRSEDRATE 27 07/17/2018         Note: Above Lab results reviewed.  Recent Imaging Review  DG PAIN CLINIC C-ARM 1-60 MIN NO REPORT Fluoro was used, but no Radiologist interpretation will be provided.  Please refer to "NOTES" tab for provider progress note. Note: Reviewed        Physical Exam  General appearance: Well nourished, well developed, and well hydrated. In no apparent acute distress Mental status: Alert, oriented x 3 (person, place, & time)       Respiratory: No evidence of acute respiratory distress Eyes: PERLA Vitals: BP (!) 127/57   Pulse (!) 51   Temp (!) 97.5 F (36.4 C)   Ht 5\' 4"  (1.626 m)   Wt 173 lb (78.5 kg)   LMP 12/18/1981   SpO2 100%   BMI 29.70 kg/m  BMI: Estimated body mass index is 29.7 kg/m as calculated from the following:   Height as of this encounter: 5\' 4"  (1.626 m).   Weight as of this encounter: 173 lb (78.5 kg). Ideal: Ideal body weight: 54.7 kg (120 lb 9.5 oz) Adjusted ideal body weight: 64.2 kg (141 lb 8.9 oz)  Assessment   Diagnosis Status  1. Chronic shoulder pain (Right)   2. Trigger point of shoulder region (Right)   3. Osteoarthritis of AC (acromioclavicular) joint (Right)   4. Osteoarthritis of shoulder (Right)   5. Postop check   6. Chronic low back pain (1ry area of Pain) (Bilateral) (R>L) w/o sciatica   7. Abnormal MRI, lumbar spine (08/23/2022)   8. DDD (degenerative disc disease), lumbosacral   9. Lumbar foraminal stenosis (Right: L3-4, L4-5, L5-S1) (Left: L2-3)   10. Lumbar spinal stenosis (with neurogenic claudication) (L3-4)   11. 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, L4-5, L5-S1) (Right)    Controlled Controlled Controlled   Updated Problems: No  problems updated.  Plan of Care  Problem-specific:  No problem-specific Assessment & Plan notes found for this encounter.  [name] has a current medication list which includes the following long-term medication(s): amlodipine, calcium carbonate, citalopram, cyclobenzaprine, donepezil, gabapentin, hydrocodone-acetaminophen, losartan, magnesium oxide, metoprolol succinate, mirabegron er, pantoprazole, rosuvastatin, hydrocodone-acetaminophen, and hydrocodone-acetaminophen.  Pharmacotherapy (Medications Ordered): No orders of the defined types were placed in this encounter.  Orders:  Orders Placed This Encounter  Procedures   Lumbar Transforaminal Epidural    Standing Status:   Future    Standing Expiration Date:   09/19/2023    Scheduling Instructions:     Laterality: Bilateral (Right L5) and (Left L2)     Level(s): L5 & L2     Sedation: Patient's choice.     Timeframe: ASAP    Order Specific Question:   Where will this procedure be performed?    Answer:   [facility]   Nursing Instructions:    Please complete this patient's postprocedure evaluation.    Scheduling  Instructions:     Please complete this patient's postprocedure evaluation.   Follow-up plan:   Return for [facility]): (R) L5 TFESI #5 + (L) L2 TFESI #2.      Interventional Therapies  Risk Factors  Considerations:   Parkinson's disease  Hx right breast cancer  OIC  SOB  CKD Stage 3  GERD  depression  osteopenia/osteoporosis     Planned  Pending:   Therapeutic right L5 #5 and left L2 TFESI #2  Therapeutic/palliative right IA knee Zilretta knee injection #2    Under consideration:   Therapeutic right SI joint injection #4  Diagnostic right genicular NB #1  Diagnostic bilateral cervical facet MBB  Therapeutic right L3 and L4 TFESI #1    Completed:   Therapeutic right superior/lateral trapezius muscle TPI x1 (04/19/2021)  Therapeutic right thoracic back TPI x2 (10/17/2018) (DKFUA)   Therapeutic left thoracic back TPI x1 (10/17/2018) (DKFUA)  Therapeutic right quadratus lumborum and erector spinae muscle TPI x1 (02/12/2018)  Therapeutic bilateral lumbar facet MBB (L2-S1) (FCT: L3-4,L4-5,L5-S1) x5 (08/09/2021) (100/100/50)  Therapeutic bilateral lumbar facet MBB (L4-S1) (FCT: L5-S1) x1 (08/29/2022) (100/100/100 x3 days/R:0L:100)  Therapeutic right lumbar facet MBB (L2-S1) x4 (12/21/2020) (DKFUA)  Therapeutic right lumbar facet RFA (L2-S1) x3 (09/28/2022) (100/100/75/75)  Therapeutic right lumbar facet RFA (L3-S1) x1 (03/01/2023) (100/100/70/70)  Therapeutic left lumbar facet RFA x2 (11/30/2022) (100/60/90/>75)  Diagnostic right SI joint Blk x3 (12/21/2020)  (DKFUA)  Diagnostic left SI joint Blk x2 (04/12/2023) (100/90/20/<25)  Therapeutic right SI joint RFA x4 (03/01/2023) (100/100/70/70)  Therapeutic midline L2-3 LESI x1 (10/04/2017) (100/100/85/>50)  Therapeutic right L3-4 LESI x2 (11/04/2020) (100/100/20/<50)  Therapeutic right L4-5 LESI x2 (03/30/2022) (100/100/75/75)  Therapeutic right L5-S1 LESI x2 (12/04/2019) (100/100/50/75)  Diagnostic/therapeutic right L3 and L4 TFESI x1 (07/27/2022) (100/100/75/50)  Therapeutic right L5 TFESI x4 (02/14/2022) (100/100/70/75)  Therapeutic right S1 TFESI x1 (11/04/2020) (100/100/20/<50)  Diagnostic bilateral superficial trochanteric bursa injec. x1 (04/30/2018) (DKFUA)  Diagnostic right IA hip injection x1 (02/13/2019) (DKFUA)  Diagnostic/therapeutic right IA steroid knee inj. x4 (01/10/2023) (100/100/100/80)  Therapeutic right (Zilretta) knee injection x1 (10/06/2021) (DKFUA)  Therapeutic right (Monovisc) knee injection x1 (06/07/2021) (100/100/100/75)    Therapeutic  Palliative (PRN) options:   Therapeutic/palliative lumbar facet RFA  Therapeutic/palliative lumbar facet MBB  Therapeutic left L2 TFESI (this level is responsible for her low back pain and left lower extremity pain.)   Pharmacotherapy  Nonopioids transfer 12/21/2020:  Gabapentin       Recent Visits Date Type Provider Dept  06/05/23 Procedure visit [name], MD [facility]  05/22/23 Office Visit [name], MD [facility]  04/26/23 Procedure visit [name], MD [facility]  04/25/23 Office Visit [name], MD [facility]  04/12/23 Procedure visit [name], MD [facility]  Showing recent visits within past 90 days and meeting all other requirements Today's Visits Date Type Provider Dept  06/19/23 Office Visit [name], MD [facility]  Showing today's visits and meeting all other requirements Future Appointments Date Type Provider Dept  06/25/23 Appointment [name], MD [facility]  Showing future appointments within next 90 days and meeting all other requirements  I discussed the assessment and treatment plan with the patient. The patient was provided an opportunity to ask questions and all were answered. The patient agreed with the plan and demonstrated an understanding of the instructions.  Patient advised to call back or seek an in-person evaluation if the symptoms or condition worsens.  Duration of encounter:  41 minutes.  Total time on encounter, as per AMA guidelines included both the face-to-face and non-face-to-face time personally spent by the physician and/or other qualified health care professional(s) on the day of the encounter (includes time in activities that require the physician or other qualified health care professional and does not include time in activities normally performed by clinical staff). Physician's time may include the following activities when performed: Preparing to see the patient (e.g., pre-charting review of records, searching for previously ordered imaging, lab work, and nerve conduction tests) Review of prior analgesic pharmacotherapies. Reviewing PMP Interpreting ordered tests  (e.g., lab work, imaging, nerve conduction tests) Performing post-procedure evaluations, including interpretation of diagnostic procedures Obtaining and/or reviewing separately obtained history Performing a medically appropriate examination and/or evaluation Counseling and educating the patient/family/caregiver Ordering medications, tests, or procedures Referring and communicating with other health care professionals (when not separately reported) Documenting clinical information in the electronic or other health record Independently interpreting results (not separately reported) and communicating results to the patient/ family/caregiver Care coordination (not separately reported)  Note by: Oswaldo Done, MD Date: 06/19/2023; Time: 1:46 PM

## 2023-06-19 ENCOUNTER — Encounter: Payer: Self-pay | Admitting: Pain Medicine

## 2023-06-19 ENCOUNTER — Ambulatory Visit: Payer: Medicare HMO | Attending: Pain Medicine | Admitting: Pain Medicine

## 2023-06-19 VITALS — BP 127/57 | HR 51 | Temp 97.5°F | Ht 64.0 in | Wt 173.0 lb

## 2023-06-19 DIAGNOSIS — G8929 Other chronic pain: Secondary | ICD-10-CM | POA: Diagnosis not present

## 2023-06-19 DIAGNOSIS — M4807 Spinal stenosis, lumbosacral region: Secondary | ICD-10-CM | POA: Insufficient documentation

## 2023-06-19 DIAGNOSIS — M48062 Spinal stenosis, lumbar region with neurogenic claudication: Secondary | ICD-10-CM | POA: Insufficient documentation

## 2023-06-19 DIAGNOSIS — M25511 Pain in right shoulder: Secondary | ICD-10-CM | POA: Diagnosis not present

## 2023-06-19 DIAGNOSIS — M5137 Other intervertebral disc degeneration, lumbosacral region: Secondary | ICD-10-CM | POA: Diagnosis not present

## 2023-06-19 DIAGNOSIS — M545 Low back pain, unspecified: Secondary | ICD-10-CM | POA: Diagnosis not present

## 2023-06-19 DIAGNOSIS — Z09 Encounter for follow-up examination after completed treatment for conditions other than malignant neoplasm: Secondary | ICD-10-CM | POA: Insufficient documentation

## 2023-06-19 DIAGNOSIS — R937 Abnormal findings on diagnostic imaging of other parts of musculoskeletal system: Secondary | ICD-10-CM | POA: Diagnosis not present

## 2023-06-19 DIAGNOSIS — M19011 Primary osteoarthritis, right shoulder: Secondary | ICD-10-CM | POA: Diagnosis not present

## 2023-06-19 DIAGNOSIS — M48061 Spinal stenosis, lumbar region without neurogenic claudication: Secondary | ICD-10-CM | POA: Diagnosis not present

## 2023-06-19 NOTE — Patient Instructions (Addendum)
Selective Nerve Root Block Patient Information  Description: Specific nerve roots exit the spinal canal and these nerves can be compressed and inflamed by a bulging disc and bone spurs.  By injecting steroids on the nerve root, we can potentially decrease the inflammation surrounding these nerves, which often leads to decreased pain.  Also, by injecting local anesthesia on the nerve root, this can provide us helpful information to give to your referring doctor if it decreases your pain.  Selective nerve root blocks can be done along the spine from the neck to the low back depending on the location of your pain.   After numbing the skin with local anesthesia, a small needle is passed to the nerve root and the position of the needle is verified using x-ray pictures.  After the needle is in correct position, we then deposit the medication.  You may experience a pressure sensation while this is being done.  The entire block usually lasts less than 15 minutes.  Conditions that may be treated with selective nerve root blocks: Low back and leg pain Spinal stenosis Diagnostic block prior to potential surgery Neck and arm pain Post laminectomy syndrome  Preparation for the injection:  Do not eat any solid food or dairy products within 8 hours of your appointment. You may drink clear liquids up to 3 hours before an appointment.  Clear liquids include water, black coffee, juice or soda.  No milk or cream please. You may take your regular medications, including pain medications, with a sip of water before your appointment.  Diabetics should hold regular insulin (if taken separately) and take 1/2 normal NPH dose the morning of the procedure.  Carry some sugar containing items with you to your appointment. A driver must accompany you and be prepared to drive you home after your procedure. Bring all your current medications with you. An IV may be inserted and sedation may be given at the discretion of the  physician. A blood pressure cuff, EKG, and other monitors will often be applied during the procedure.  Some patients may need to have extra oxygen administered for a short period. You will be asked to provide medical information, including allergies, prior to the procedure.  We must know immediately if you are taking blood  Thinners (like Coumadin) or if you are allergic to IV iodine contrast (dye).  Possible side-effects: All are usually temporary Bleeding from needle site Light headedness Numbness and tingling Decreased blood pressure Weakness in arms/legs Pressure sensation in back/neck Pain at injection site (several days)  Possible complications: All are extremely rare Infection Nerve injury Spinal headache (a headache wore with upright position)  Call if you experience: Fever/chills associated with headache or increased back/neck pain Headache worsened by an upright position New onset weakness or numbness of an extremity below the injection site Hives or difficulty breathing (go to the emergency room) Inflammation or drainage at the injection site(s) Severe back/neck pain greater than usual New symptoms which are concerning to you  Please note:  Although the local anesthetic injected can often make your back or neck feel good for several hours after the injection the pain will likely return.  It takes 3-5 days for steroids to work on the nerve root. You may not notice any pain relief for at least one week.  If effective, we will often do a series of 3 injections spaced 3-6 weeks apart to maximally decrease your pain.    If you have any questions, please call [phone] [place]   [facility] ______________________________________________________________________  Procedure instructions  Do not eat or drink fluids (other than water) for 6 hours before your procedure  No water for 2 hours before your procedure  Take your blood pressure  medicine with a sip of water  Arrive 30 minutes before your appointment  Carefully read the "Preparing for your procedure" detailed instructions  If you have questions call us at [phone]  _____________________________________________________________________    ______________________________________________________________________  Preparing for your procedure  Appointments: If you think you may not be able to keep your appointment, call 24-48 hours in advance to cancel. We need time to make it available to others.  During your procedure appointment there will be: No Prescription Refills. No disability issues to discussed. No medication changes or discussions.  Instructions: Food intake: Avoid eating anything solid for at least 8 hours prior to your procedure. Clear liquid intake: You may take clear liquids such as water up to 2 hours prior to your procedure. (No carbonated drinks. No soda.) Transportation: Unless otherwise stated by your physician, bring a driver. Morning Medicines: Except for blood thinners, take all of your other morning medications with a sip of water. Make sure to take your heart and blood pressure medicines. If your blood pressure's lower number is above 100, the case will be rescheduled. Blood thinners: Make sure to stop your blood thinners as instructed.  If you take a blood thinner, but were not instructed to stop it, call our office [phone] and ask to talk to a nurse. Not stopping a blood thinner prior to certain procedures could lead to serious complications. Diabetics on insulin: Notify the staff so that you can be scheduled 1st case in the morning. If your diabetes requires high dose insulin, take only  of your normal insulin dose the morning of the procedure and notify the staff that you have done so. Preventing infections: Shower with an antibacterial soap the morning of your procedure.  Build-up your immune system: Take 1000 mg of Vitamin  C with every meal (3 times a day) the day prior to your procedure. Antibiotics: Inform the nursing staff if you are taking any antibiotics or if you have any conditions that may require antibiotics prior to procedures. (Example: recent joint implants)   Pregnancy: If you are pregnant make sure to notify the nursing staff. Not doing so may result in injury to the fetus, including death.  Sickness: If you have a cold, fever, or any active infections, call and cancel or reschedule your procedure. Receiving steroids while having an infection may result in complications. Arrival: You must be in the facility at least 30 minutes prior to your scheduled procedure. Tardiness: Your scheduled time is also the cutoff time. If you do not arrive at least 15 minutes prior to your procedure, you will be rescheduled.  Children: Do not bring any children with you. Make arrangements to keep them home. Dress appropriately: There is always a possibility that your clothing may get soiled. Avoid long dresses. Valuables: Do not bring any jewelry or valuables.  Reasons to call and reschedule or cancel your procedure: (Following these recommendations will minimize the risk of a serious complication.) Surgeries: Avoid having procedures within 2 weeks of any surgery. (Avoid for 2 weeks before or after any surgery). Flu Shots: Avoid having procedures within 2 weeks of a flu shots or . (Avoid for 2 weeks before or after immunizations). Barium: Avoid having a procedure within 7-10 days after having had a  radiological study involving the use of radiological contrast. (Myelograms, Barium swallow or enema study). Heart attacks: Avoid any elective procedures or surgeries for the initial 6 months after a "Myocardial Infarction" (Heart Attack). Blood thinners: It is imperative that you stop these medications before procedures. Let us know if you if you take any blood thinner.  Infection: Avoid procedures during or within two weeks of an  infection (including chest colds or gastrointestinal problems). Symptoms associated with infections include: Localized redness, fever, chills, night sweats or profuse sweating, burning sensation when voiding, cough, congestion, stuffiness, runny nose, sore throat, diarrhea, nausea, vomiting, cold or Flu symptoms, recent or current infections. It is specially important if the infection is over the area that we intend to treat. Heart and lung problems: Symptoms that may suggest an active cardiopulmonary problem include: cough, chest pain, breathing difficulties or shortness of breath, dizziness, ankle swelling, uncontrolled high or unusually low blood pressure, and/or palpitations. If you are experiencing any of these symptoms, cancel your procedure and contact your primary care physician for an evaluation.  Remember:  Regular Business hours are:  Monday to Thursday 8:00 AM to 4:00 PM  Provider's Schedule: [name], MD:  Procedure days: Tuesday and Thursday 7:30 AM to 4:00 PM  Edward Jolly, MD:  Procedure days: Monday and Wednesday 7:30 AM to 4:00 PM  ______________________________________________________________________    ____________________________________________________________________________________________  General Risks and Possible Complications  Patient Responsibilities: It is important that you read this as it is part of your informed consent. It is our duty to inform you of the risks and possible complications associated with treatments offered to you. It is your responsibility as a patient to read this and to ask questions about anything that is not clear or that you believe was not covered in this document.  Patient's Rights: You have the right to refuse treatment. You also have the right to change your mind, even after initially having agreed to have the treatment done. However, under this last option, if you wait until the last second to change your mind, you may be  charged for the materials used up to that point.  Introduction: Medicine is not an Visual merchandiser. Everything in Medicine, including the lack of treatment(s), carries the potential for danger, harm, or loss (which is by definition: Risk). In Medicine, a complication is a secondary problem, condition, or disease that can aggravate an already existing one. All treatments carry the risk of possible complications. The fact that a side effects or complications occurs, does not imply that the treatment was conducted incorrectly. It must be clearly understood that these can happen even when everything is done following the highest safety standards.  No treatment: You can choose not to proceed with the proposed treatment alternative. The "PRO(s)" would include: avoiding the risk of complications associated with the therapy. The "CON(s)" would include: not getting any of the treatment benefits. These benefits fall under one of three categories: diagnostic; therapeutic; and/or palliative. Diagnostic benefits include: getting information which can ultimately lead to improvement of the disease or symptom(s). Therapeutic benefits are those associated with the successful treatment of the disease. Finally, palliative benefits are those related to the decrease of the primary symptoms, without necessarily curing the condition (example: decreasing the pain from a flare-up of a chronic condition, such as incurable terminal cancer).  General Risks and Complications: These are associated to most interventional treatments. They can occur alone, or in combination. They fall under one of the following six (6) categories: no  benefit or worsening of symptoms; bleeding; infection; nerve damage; allergic reactions; and/or death. No benefits or worsening of symptoms: In Medicine there are no guarantees, only probabilities. No healthcare provider can ever guarantee that a medical treatment will work, they can only state the probability  that it may. Furthermore, there is always the possibility that the condition may worsen, either directly, or indirectly, as a consequence of the treatment. Bleeding: This is more common if the patient is taking a blood thinner, either prescription or over the counter (example: Goody Powders, Fish oil, Aspirin, Garlic, etc.), or if suffering a condition associated with impaired coagulation (example: Hemophilia, cirrhosis of the liver, low platelet counts, etc.). However, even if you do not have one on these, it can still happen. If you have any of these conditions, or take one of these drugs, make sure to notify your treating physician. Infection: This is more common in patients with a compromised immune system, either due to disease (example: diabetes, cancer, human immunodeficiency virus [HIV], etc.), or due to medications or treatments (example: therapies used to treat cancer and rheumatological diseases). However, even if you do not have one on these, it can still happen. If you have any of these conditions, or take one of these drugs, make sure to notify your treating physician. Nerve Damage: This is more common when the treatment is an invasive one, but it can also happen with the use of medications, such as those used in the treatment of cancer. The damage can occur to small secondary nerves, or to large primary ones, such as those in the spinal cord and brain. This damage may be temporary or permanent and it may lead to impairments that can range from temporary numbness to permanent paralysis and/or brain death. Allergic Reactions: Any time a substance or material comes in contact with our body, there is the possibility of an allergic reaction. These can range from a mild skin rash (contact dermatitis) to a severe systemic reaction (anaphylactic reaction), which can result in death. Death: In general, any medical intervention can result in death, most of the time due to an unforeseen  complication. ____________________________________________________________________________________________

## 2023-06-20 ENCOUNTER — Other Ambulatory Visit: Payer: Self-pay | Admitting: Internal Medicine

## 2023-06-20 DIAGNOSIS — E7800 Pure hypercholesterolemia, unspecified: Secondary | ICD-10-CM

## 2023-06-25 ENCOUNTER — Encounter: Payer: Medicare HMO | Admitting: Pain Medicine

## 2023-06-26 ENCOUNTER — Ambulatory Visit: Payer: Medicare HMO | Admitting: Pain Medicine

## 2023-06-26 ENCOUNTER — Inpatient Hospital Stay: Payer: Medicare HMO

## 2023-06-26 ENCOUNTER — Inpatient Hospital Stay: Payer: Medicare HMO | Attending: Oncology

## 2023-06-27 ENCOUNTER — Ambulatory Visit: Payer: Medicare HMO | Attending: Pain Medicine | Admitting: Pain Medicine

## 2023-06-27 ENCOUNTER — Encounter: Payer: Self-pay | Admitting: Pain Medicine

## 2023-06-27 VITALS — BP 115/64 | HR 57 | Temp 96.6°F | Resp 16 | Ht 64.0 in | Wt 173.0 lb

## 2023-06-27 DIAGNOSIS — G894 Chronic pain syndrome: Secondary | ICD-10-CM

## 2023-06-27 DIAGNOSIS — M25561 Pain in right knee: Secondary | ICD-10-CM

## 2023-06-27 DIAGNOSIS — Z79899 Other long term (current) drug therapy: Secondary | ICD-10-CM | POA: Diagnosis not present

## 2023-06-27 DIAGNOSIS — Z79891 Long term (current) use of opiate analgesic: Secondary | ICD-10-CM

## 2023-06-27 DIAGNOSIS — M47816 Spondylosis without myelopathy or radiculopathy, lumbar region: Secondary | ICD-10-CM | POA: Diagnosis not present

## 2023-06-27 DIAGNOSIS — M545 Low back pain, unspecified: Secondary | ICD-10-CM

## 2023-06-27 DIAGNOSIS — S46001S Unspecified injury of muscle(s) and tendon(s) of the rotator cuff of right shoulder, sequela: Secondary | ICD-10-CM

## 2023-06-27 DIAGNOSIS — G8929 Other chronic pain: Secondary | ICD-10-CM | POA: Diagnosis not present

## 2023-06-27 DIAGNOSIS — M25511 Pain in right shoulder: Secondary | ICD-10-CM | POA: Diagnosis not present

## 2023-06-27 MED ORDER — HYDROCODONE-ACETAMINOPHEN 5-325 MG PO TABS: 1.0000 | ORAL_TABLET | Freq: Three times a day (TID) | ORAL | 0 refills | Status: DC

## 2023-06-27 NOTE — Progress Notes (Signed)
PROVIDER NOTE: Information contained herein reflects review and annotations entered in association with encounter. Interpretation of such information and data should be left to medically-trained personnel. Information provided to patient can be located elsewhere in the medical record under "Patient Instructions". Document created using STT-dictation technology, any transcriptional errors that may result from process are unintentional.    Patient: Wanda Martinez  Service Category: E/M  Provider: Oswaldo Done, MD  DOB: Jan 29, 1935  DOS: 06/27/2023  Referring Provider: Dale Fed[place], MD  MRN: [id number]  Specialty: Interventional Pain Management  PCP: Wanda Carmichael, MD  Type: Established Patient  Setting: Ambulatory outpatient    Location: Office  Delivery: Face-to-face     HPI  [name], a 88 y.o. year old female, is here today because of her Chronic pain syndrome [G89.4]. Wanda Martinez primary complain today is Back Pain (lower)  Pertinent problems: Wanda Martinez has Headache; Parkinson's disease; Chronic low back pain (1ry area of Pain) (Bilateral) (R>L) w/o sciatica; Lumbar spondylosis; Chronic hip pain (Bilateral); Chronic neck pain; Cervical spondylosis; Chronic cervical radicular pain (Right); Diffuse myofascial pain syndrome; Neurogenic pain; Chronic upper back pain (Right); Myofascial pain syndrome (Right) (cervicothoracic); Lumbar facet syndrome (Bilateral) (R>L); History of breast cancer; Cervical facet hypertrophy; Cervical facet syndrome (HCC); Chronic shoulder pain (Right); Chronic pain syndrome; Chronic sacroiliac joint pain (Left); Chronic sacroiliac joint pain (Right); 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, L4-5, L5-S1) (Right); Lumbar spinal stenosis (with neurogenic claudication) (L3-4); Chronic lower extremity pain (Right); Chronic lumbar radicular pain (S1) (Right); Trochanteric bursitis of hip (Bilateral); Spondylosis without myelopathy or radiculopathy,  lumbosacral region; Trigger point with back pain (Right); DDD (degenerative disc disease), lumbosacral; Chronic upper extremity pain (Right); Chronic thoracic back pain (Bilateral) (L>R); Trigger point of thoracic region (Bilateral) (L>R); Other specified dorsopathies, sacral and sacrococcygeal region; Sacroiliac joint dysfunction (Right); Osteoarthritis of sacroiliac joint (Right); Somatic dysfunction of sacroiliac joint (Right); Chronic musculoskeletal pain; Facial pain; Chronic hip pain (Right); Osteoarthritis of hip (Right); Left ear pain; 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HCC); Abnormal MRI, lumbar spine (08/23/2022); Osteoarthritis involving multiple joints; Other spondylosis, sacral and sacrococcygeal region; Lumbar facet hypertrophy (Multilevel) (Bilateral); Chronic knee pain (Right); Osteoarthritis of knee (Right); Trigger point of shoulder region (Right); Osteoarthritis of AC (acromioclavicular) joint (Right); Osteoarthritis of shoulder (Right); Unspecified injury of muscle(s) and tendon(s) of the rotator cuff of shoulder, sequela (Right); Cervicalgia; DDD (degenerative disc disease), lumbar; Lumbar radicular pain (Bilateral); Lumbar foraminal stenosis (Right: L3-4, L4-5, L5-S1) (Left: L2-3); Abnormal MRI, hip (Right) (05/04/2022); Abnormal MRI, knee (01/04/2022); Tricompartment osteoarthritis of knee (Right); Chronic low back pain (Right) w/o sciatica; Hoffa's fat pad disease (HCC) (Right); Hoffa's pad syndrome (HCC) (Right); Chronic low back pain (Right) w/ sciatica (Right); Lower extremity weakness (Right); Chronic radicular pain of lower back; Chronic sciatica (Right); Lumbosacral radiculopathy at L5 (Right); and Low back pain of over 3 months duration on their pertinent problem list. Pain Assessment: Severity of Chronic pain is reported as a  /10. Location: Back Left, Right, Lower/down side of right leg to calve. Onset: More than a month ago. Quality: Aching, Burning, Constant, Pressure, Throbbing.  Timing: Constant. Modifying factor(s): rest, lying down, procedures. Vitals:  height is 5\' 4"  (1.626 m) and weight is 173 lb (78.5 kg). Her temperature is 96.6 F (35.9 C) (abnormal). Her blood pressure is 115/64 and her pulse is 57 (abnormal). Her respiration is 16 and oxygen saturation is 99%.  BMI: Estimated body mass index is 29.7 kg/m as calculated from the following:   Height as of  this encounter: 5\' 4"  (1.626 m).   Weight as of this encounter: 173 lb (78.5 kg). Last encounter: 06/19/2023. Last procedure: 06/05/2023.  Reason for encounter: medication management.  The patient indicates doing well with the current medication regimen. No adverse reactions or side effects reported to the medications.  The patient currently has a urinary tract infection and she is on some antibiotics.  This apparently has thrown her off a little bit and she forgot to bring her pills to be counted.  Today I have given her a reminder about this and we will try to remind her again before her next appointment.  Because she did not bring her pills, we have provided her with only 30 days worth of medication and remind her to bring them the next time.  Routine UDS ordered today.   RTCB: 07/28/2023    Pharmacotherapy Assessment  Analgesic: Hydrocodone/APAP 5/325, 1 tab PO q 8 hrs (15 mg/day of hydrocodone) MME/day: 15 mg/day.   Monitoring: [place] PMP: PDMP reviewed during this encounter.       Pharmacotherapy: No side-effects or adverse reactions reported. Compliance: No problems identified. Effectiveness: Clinically acceptable.  Wanda [name], RN  06/27/2023  1:13 PM  Sign when Signing Visit Safety precautions to be maintained throughout the outpatient stay will include: orient to surroundings, keep bed in low position, maintain call bell within reach at all times, provide assistance with transfer out of bed and ambulation.   Did not bring medication with her today. Patient states she is not feeling well and  thinking today.  Patient very confused and forgetful today related to bladder infection.    No results found for: "CBDTHCR" No results found for: "D8THCCBX" No results found for: "D9THCCBX"  UDS:  Summary  Date Value Ref Range Status  06/12/2022 Note  Final    Comment:    ==================================================================== Tox[employer] Select 13 (MW) ==================================================================== Test                             Result       Flag       Units  Drug Present and Declared for Prescription Verification   Hydrocodone                    1766         EXPECTED   ng/mg creat   Dihydrocodeine                 303          EXPECTED   ng/mg creat   Norhydrocodone                 1597         EXPECTED   ng/mg creat    Sources of hydrocodone include scheduled prescription medications.    Dihydrocodeine and norhydrocodone are expected metabolites of    hydrocodone. Dihydrocodeine is also available as a scheduled    prescription medication.  ==================================================================== Test                      Result    Flag   Units      Ref Range   Creatinine              38               mg/dL      >=40 ==================================================================== Declared Medications:  The flagging and interpretation  on this report are based on the  following declared medications.  Unexpected results may arise from  inaccuracies in the declared medications.   **Note: The testing scope of this panel includes these medications:   Hydrocodone (Norco)   **Note: The testing scope of this panel does not include the  following reported medications:   Acetaminophen (Norco)  Amlodipine  Aspirin  Calcium  Chondroitin  Citalopram (Celexa)  Cyclobenzaprine (Flexeril)  Donepezil (Aricept)  Gabapentin (Neurontin)  Glucosamine  Letrozole (Femara)  Losartan (Cozaar)  Lubiprostone (Amitiza)  Magnesium  (Mag-Ox)  Melatonin  Meloxicam (Mobic)  Metoprolol  Multivitamin  Nitrofurantoin (Macrobid)  Omega-3 Fatty Acids  Omeprazole  Pantoprazole (Protonix)  Rosuvastatin (Crestor) ==================================================================== For clinical consultation, please call [phone]. ====================================================================       ROS  Constitutional: Denies any fever or chills Gastrointestinal: No reported hemesis, hematochezia, vomiting, or acute GI distress Musculoskeletal: Denies any acute onset joint swelling, redness, loss of ROM, or weakness Neurological: No reported episodes of acute onset apraxia, aphasia, dysarthria, agnosia, amnesia, paralysis, loss of coordination, or loss of consciousness  Medication Review  Glucosamine-Chondroitin, HYDROcodone-acetaminophen, Omega-3, amLODipine, aspirin EC, calcium carbonate, ciprofloxacin, citalopram, cyclobenzaprine, donepezil, erythromycin, gabapentin, hydrocortisone, letrozole, losartan, lubiprostone, magnesium oxide, melatonin, metoprolol succinate, mirabegron ER, multivitamin, naloxone, nitrofurantoin (macrocrystal-monohydrate), pantoprazole, and rosuvastatin  History Review  Allergy: Ms. Kruczek is allergic to sulfa antibiotics and vesicare [solifenacin]. Drug: Ms. Aki  reports no history of drug use. Alcohol:  reports no history of alcohol use. Tobacco:  reports that she has never smoked. She has never been exposed to tobacco smoke. She has never used smokeless tobacco. Social: Ms. Spranger  reports that she has never smoked. She has never been exposed to tobacco smoke. She has never used smokeless tobacco. She reports that she does not drink alcohol and does not use drugs. Medical:  has a past medical history of Acute postoperative pain (10/25/2017), Anemia, Arm pain (07/26/2015), Arthritis, Arthritis, degenerative (03/26/2014), Back pain (11/01/2013), Bladder infection (06/2018), Breast cancer  (HCC), Breast cancer (HCC), CHEST PAIN (04/29/2010), Chronic cystitis, Cystocele (02/22/2013), Cystocele, midline (08/19/2013), Degeneration of intervertebral disc of lumbosacral region (03/26/2014), DYSPNEA (04/29/2010), Enthesopathy of hip (03/26/2014), GERD (gastroesophageal reflux disease), Hiatal hernia, HTN (hypertension), Hypokalemia (06/2018), Hyponatremia (06/2018), LBP (low back pain) (03/26/2014), Neck pain (11/01/2013), Parkinson disease, Pneumonia (06/2018), Sinusitis (02/07/2015), Skin lesions (07/12/2014), and Urinary incontinence. Surgical: Ms. Chunn  has a past surgical history that includes Tonsillectomy and adenoidectomy (1979); Vesicovaginal fistula closure w/ TAH (1983); Breast surgery (1986); Breast enhancement surgery (1987); Breast implant removal; Breast implant removal (Right, 08/29/2012); Mastectomy (08/2012); Abdominal hysterectomy; Colonoscopy with propofol (N/A, 09/13/2016); Breast biopsy (2013); and Blepharoplasty. Family: family history includes Colon polyps in her father; Diabetes in her father; Parkinson's disease in her mother; Stroke in her father and mother.  Laboratory Chemistry Profile   Renal Lab Results  Component Value Date   BUN 35 (H) 05/15/2023   CREATININE 1.44 (H) 05/15/2023   BCR 20 06/11/2020   GFR 48.46 (L) 04/09/2023   GFRAA >60 07/27/2020   GFRNONAA 36 (L) 05/15/2023    Hepatic Lab Results  Component Value Date   AST 26 04/09/2023   ALT 18 04/09/2023   ALBUMIN 4.1 04/09/2023   ALKPHOS 30 (L) 04/09/2023    Electrolytes Lab Results  Component Value Date   NA 133 (L) 05/15/2023   K 3.5 05/15/2023   CL 95 (L) 05/15/2023   CALCIUM 9.3 05/15/2023   MG 2.2 07/17/2018    Bone Lab Results  Component Value  Date   VD25OH 66.95 09/26/2022   25OHVITD1 42 07/17/2018   25OHVITD2 <1.0 07/17/2018   25OHVITD3 42 07/17/2018    Inflammation (CRP: Acute Phase) (ESR: Chronic Phase) Lab Results  Component Value Date   CRP 3 07/17/2018   ESRSEDRATE 27  07/17/2018         Note: Above Lab results reviewed.  Recent Imaging Review  MR SHOULDER RIGHT WO CONTRAST CLINICAL DATA:  Shoulder pain, chronic, erosive osteoarthritis suspected, xray Martinez Shoulder pain, chronic, bursitis suspected, xray Martinez  EXAM: MRI OF THE RIGHT SHOULDER WITHOUT CONTRAST  TECHNIQUE: Multiplanar, multisequence MR imaging of the shoulder was performed. No intravenous contrast was administered.  COMPARISON:  X-ray 06/01/2021  FINDINGS: Rotator cuff: Advanced tendinosis of the supraspinatus, infraspinatus, and subscapularis tendons. Low-grade interstitial tear of the distal subscapularis tendon. The anterior margin of the distal supraspinatus tendon is ill-defined and appears to be partially torn. No definite full-thickness or retracted component.  Muscles: Mild teres minor muscle atrophy. Otherwise preserved rotator cuff muscle bulk.  Biceps long head:  Intact and appropriately positioned.  Acromioclavicular Joint: Mild-moderate degenerative changes of the AC joint. Small AC joint effusion. No significant subacromial-subdeltoid bursal fluid.  Glenohumeral Joint: No joint effusion. No cartilage defect.  Labrum: Grossly intact although evaluation is limited in the absence of intra-articular fluid. No paralabral cyst.  Bones: No acute fracture. No dislocation. No bone marrow edema. No marrow replacing bone lesion.  Other: None.  IMPRESSION: 1. Advanced rotator cuff tendinosis. Low-grade interstitial tear of the distal subscapularis tendon. The anterior margin of the distal supraspinatus tendon is ill-defined and appears to be partially torn. No definite full-thickness or retracted component. 2. Mild-moderate degenerative changes of the [facility] joint.  Electronically Signed   By: Duanne Guess D.O.   On: 06/21/2023 09:36 Note: Reviewed        Physical Exam  General appearance: Well nourished, well developed, and well hydrated. In no apparent  acute distress Mental status: Alert, oriented x 3 (person, place, & time)       Respiratory: No evidence of acute respiratory distress Eyes: PERLA Vitals: BP 115/64   Pulse (!) 57   Temp (!) 96.6 F (35.9 C)   Resp 16   Ht 5\' 4"  (1.626 m)   Wt 173 lb (78.5 kg)   LMP 12/18/1981   SpO2 99%   BMI 29.70 kg/m  BMI: Estimated body mass index is 29.7 kg/m as calculated from the following:   Height as of this encounter: 5\' 4"  (1.626 m).   Weight as of this encounter: 173 lb (78.5 kg). Ideal: Ideal body weight: 54.7 kg (120 lb 9.5 oz) Adjusted ideal body weight: 64.2 kg (141 lb 8.9 oz)  Assessment   Diagnosis Status  1. Chronic pain syndrome   2. Chronic low back pain (1ry area of Pain) (Bilateral) (R>L) w/o sciatica   3. Chronic shoulder pain (Right)   4. Lumbar facet syndrome (Bilateral) (R>L)   5. Chronic knee pain (Right)   6. Unspecified injury of muscle(s) and tendon(s) of the rotator cuff of shoulder, sequela (Right)   7. Pharmacologic therapy   8. Chronic use of opiate for therapeutic purpose   9. Encounter for medication management   10. Encounter for chronic pain management    Controlled Controlled Controlled   Updated Problems: No problems updated.  Plan of Care  Problem-specific:  No problem-specific Assessment & Plan notes found for this encounter.  Ms. Winston Sobczyk has a current medication list which includes  the following long-term medication(s): amlodipine, calcium carbonate, citalopram, cyclobenzaprine, donepezil, gabapentin, [START ON 06/28/2023] hydrocodone-acetaminophen, losartan, magnesium oxide, metoprolol succinate, mirabegron er, pantoprazole, and rosuvastatin.  Pharmacotherapy (Medications Ordered): Meds ordered this encounter  Medications   HYDROcodone-acetaminophen (NORCO/VICODIN) 5-325 MG tablet    Sig: Take 1 tablet by mouth 3 (three) times daily. Must last 30 days.    Dispense:  90 tablet    Refill:  0    DO NOT: delete (not  duplicate); no partial-fill (will deny script to complete), no refill request (F/U required). DISPENSE: 1 day early if closed on fill date. WARN: No CNS-depressants within 8 hrs of med.   Orders:  Orders Placed This Encounter  Procedures   ToxASSURE Select 13 (MW), Urine    Volume: 30 ml(s). Minimum 3 ml of urine is needed. Document temperature of fresh sample. Indications: Long term (current) use of opiate analgesic (U98.119)    Order Specific Question:   Release to patient    Answer:   Immediate   Nursing Instructions:    1). STAT: UDS required today. 2). Make sure to document all opioids and benzodiazepines taken, including time of last intake. 3). If order is entered on a procedure day, make sure sample is obtained before any medications are administered.   Follow-up plan:   Return in about 1 month (around 07/28/2023) for Eval-day (M,W), (F2F), (MM).      Interventional Therapies  Risk Factors  Considerations:   Parkinson's disease  Hx right breast cancer  OIC  SOB  CKD Stage 3  GERD  depression  osteopenia/osteoporosis     Planned  Pending:   Therapeutic right L5 #5 and left L2 TFESI #2  Therapeutic/palliative right IA knee Zilretta knee injection #2    Under consideration:   Therapeutic right SI joint injection #4  Diagnostic right genicular NB #1  Diagnostic bilateral cervical facet MBB  Therapeutic right L3 and L4 TFESI #1    Completed:   Therapeutic right superior/lateral trapezius muscle TPI x1 (04/19/2021)  Therapeutic right thoracic back TPI x2 (10/17/2018) (DKFUA)  Therapeutic left thoracic back TPI x1 (10/17/2018) (DKFUA)  Therapeutic right quadratus lumborum and erector spinae muscle TPI x1 (02/12/2018)  Therapeutic bilateral lumbar facet MBB (L2-S1) (FCT: L3-4,L4-5,L5-S1) x5 (08/09/2021) (100/100/50)  Therapeutic bilateral lumbar facet MBB (L4-S1) (FCT: L5-S1) x1 (08/29/2022) (100/100/100 x3 days/R:0L:100)  Therapeutic right lumbar facet MBB (L2-S1) x4  (12/21/2020) (DKFUA)  Therapeutic right lumbar facet RFA (L2-S1) x3 (09/28/2022) (100/100/75/75)  Therapeutic right lumbar facet RFA (L3-S1) x1 (03/01/2023) (100/100/70/70)  Therapeutic left lumbar facet RFA x2 (11/30/2022) (100/60/90/>75)  Diagnostic right SI joint Blk x3 (12/21/2020)  (DKFUA)  Diagnostic left SI joint Blk x2 (04/12/2023) (100/90/20/<25)  Therapeutic right SI joint RFA x4 (03/01/2023) (100/100/70/70)  Therapeutic midline L2-3 LESI x1 (10/04/2017) (100/100/85/>50)  Therapeutic right L3-4 LESI x2 (11/04/2020) (100/100/20/<50)  Therapeutic right L4-5 LESI x2 (03/30/2022) (100/100/75/75)  Therapeutic right L5-S1 LESI x2 (12/04/2019) (100/100/50/75)  Diagnostic/therapeutic right L3 and L4 TFESI x1 (07/27/2022) (100/100/75/50)  Therapeutic right L5 TFESI x4 (02/14/2022) (100/100/70/75)  Therapeutic right S1 TFESI x1 (11/04/2020) (100/100/20/<50)  Diagnostic bilateral superficial trochanteric bursa injec. x1 (04/30/2018) (DKFUA)  Diagnostic right IA hip injection x1 (02/13/2019) (DKFUA)  Diagnostic/therapeutic right IA steroid knee inj. x4 (01/10/2023) (100/100/100/80)  Therapeutic right (Zilretta) knee injection x1 (10/06/2021) (DKFUA)  Therapeutic right (Monovisc) knee injection x1 (06/07/2021) (100/100/100/75)    Therapeutic  Palliative (PRN) options:   Therapeutic/palliative lumbar facet RFA  Therapeutic/palliative lumbar facet MBB  Therapeutic left L2 TFESI (this level  is responsible for her low back pain and left lower extremity pain.)   Pharmacotherapy  Nonopioids transfer 12/21/2020: Gabapentin       Recent Visits Date Type Provider Dept  06/19/23 Office Visit [name], MD [facility]  06/05/23 Procedure visit [name], MD [facility]  05/22/23 Office Visit [name], MD [facility]  04/26/23 Procedure visit [name], MD [facility]  04/25/23 Office Visit [name], MD [facility]  04/12/23 Procedure visit [name], MD [facility]  Showing recent visits within past 90 days and meeting all other requirements Today's Visits Date Type Provider Dept  06/27/23 Office Visit [name], MD [facility]  Showing today's visits and meeting all other requirements Future Appointments Date Type Provider Dept  07/03/23 Appointment [name], MD [facility]  Showing future appointments within next 90 days and meeting all other requirements  I discussed the assessment and treatment plan with the patient. The patient was provided an opportunity to ask questions and all were answered. The patient agreed with the plan and demonstrated an understanding of the instructions.  Patient advised to call back or seek an in-person evaluation if the symptoms or condition worsens.  Duration of encounter: 30 minutes.  Total time on encounter, as per AMA guidelines included both the face-to-face and non-face-to-face time personally spent by the physician and/or other qualified health care professional(s) on the day of the encounter (includes time in activities that require the physician or other qualified health care professional and does not include time in activities normally performed by clinical staff). Physician's time may include the following activities when performed: Preparing to see the patient (e.g., pre-charting review of records, searching for previously ordered imaging, lab work, and nerve conduction tests) Review of prior analgesic pharmacotherapies. Reviewing PMP Interpreting ordered tests (e.g., lab work, imaging, nerve conduction tests) Performing post-procedure evaluations, including interpretation of diagnostic procedures Obtaining and/or reviewing separately obtained history Performing a medically appropriate examination and/or evaluation Counseling and educating the patient/family/caregiver Ordering  medications, tests, or procedures Referring and communicating with other health care professionals (when not separately reported) Documenting clinical information in the electronic or other health record Independently interpreting results (not separately reported) and communicating results to the patient/ family/caregiver Care coordination (not separately reported)  Note by: Wanda Done, MD Date: 06/27/2023; Time: 1:25 PM

## 2023-06-27 NOTE — Progress Notes (Signed)
Safety precautions to be maintained throughout the outpatient stay will include: orient to surroundings, keep bed in low position, maintain call bell within reach at all times, provide assistance with transfer out of bed and ambulation.   Did not bring medication with her today. Patient states she is not feeling well and thinking today.  Patient very confused and forgetful today related to bladder infection.

## 2023-06-27 NOTE — Patient Instructions (Signed)
____________________________________________________________________________________________  Opioid Pain Medication Update  To: All patients taking opioid pain medications. (I.e.: hydrocodone, hydromorphone, oxycodone, oxymorphone, morphine, codeine, methadone, tapentadol, tramadol, buprenorphine, fentanyl, etc.)  Re: Updated review of side effects and adverse reactions of opioid analgesics, as well as new information about long term effects of this class of medications.  Direct risks of long-term opioid therapy are not limited to opioid addiction and overdose. Potential medical risks include serious fractures, breathing problems during sleep, hyperalgesia, immunosuppression, chronic constipation, bowel obstruction, myocardial infarction, and tooth decay secondary to xerostomia.  Unpredictable adverse effects that can occur even if you take your medication correctly: Cognitive impairment, respiratory depression, and death. Most people think that if they take their medication "correctly", and "as instructed", that they will be safe. Nothing could be farther from the truth. In reality, a significant amount of recorded deaths associated with the use of opioids has occurred in individuals that had taken the medication for a long time, and were taking their medication correctly. The following are examples of how this can happen: Patient taking his/her medication for a long time, as instructed, without any side effects, is given a certain antibiotic or another unrelated medication, which in turn triggers a "Drug-to-drug interaction" leading to disorientation, cognitive impairment, impaired reflexes, respiratory depression or an untoward event leading to serious bodily harm or injury, including death.  Patient taking his/her medication for a long time, as instructed, without any side effects, develops an acute impairment of liver and/or kidney function. This will lead to a rapid inability of the body to  breakdown and eliminate their pain medication, which will result in effects similar to an "overdose", but with the same medicine and dose that they had always taken. This again may lead to disorientation, cognitive impairment, impaired reflexes, respiratory depression or an untoward event leading to serious bodily harm or injury, including death.  A similar problem will occur with patients as they grow older and their liver and kidney function begins to decrease as part of the aging process.  Background information: Historically, the original case for using long-term opioid therapy to treat chronic noncancer pain was based on safety assumptions that subsequent experience has called into question. In 1996, the American Pain Society and the American Academy of Pain Medicine issued a consensus statement supporting long-term opioid therapy. This statement acknowledged the dangers of opioid prescribing but concluded that the risk for addiction was low; respiratory depression induced by opioids was short-lived, occurred mainly in opioid-naive patients, and was antagonized by pain; tolerance was not a common problem; and efforts to control diversion should not constrain opioid prescribing. This has now proven to be wrong. Experience regarding the risks for opioid addiction, misuse, and overdose in community practice has failed to support these assumptions.  According to the Centers for Disease Control and Prevention, fatal overdoses involving opioid analgesics have increased sharply over the past decade. Currently, more than 96,700 people die from drug overdoses every year. Opioids are a factor in 7 out of every 10 overdose deaths. Deaths from drug overdose have surpassed motor vehicle accidents as the leading cause of death for individuals between the ages of 35 and 54.  Clinical data suggest that neuroendocrine dysfunction may be very common in both men and women, potentially causing hypogonadism, erectile  dysfunction, infertility, decreased libido, osteoporosis, and depression. Recent studies linked higher opioid dose to increased opioid-related mortality. Controlled observational studies reported that long-term opioid therapy may be associated with increased risk for cardiovascular events. Subsequent meta-analysis concluded   that the safety of long-term opioid therapy in elderly patients has not been proven.   Side Effects and adverse reactions: Common side effects: Drowsiness (sedation). Dizziness. Nausea and vomiting. Constipation. Physical dependence -- Dependence often manifests with withdrawal symptoms when opioids are discontinued or decreased. Tolerance -- As you take repeated doses of opioids, you require increased medication to experience the same effect of pain relief. Respiratory depression -- This can occur in healthy people, especially with higher doses. However, people with COPD, asthma or other lung conditions may be even more susceptible to fatal respiratory impairment.  Uncommon side effects: An increased sensitivity to feeling pain and extreme response to pain (hyperalgesia). Chronic use of opioids can lead to this. Delayed gastric emptying (the process by which the contents of your stomach are moved into your small intestine). Muscle rigidity. Immune system and hormonal dysfunction. Quick, involuntary muscle jerks (myoclonus). Arrhythmia. Itchy skin (pruritus). Dry mouth (xerostomia).  Long-term side effects: Chronic constipation. Sleep-disordered breathing (SDB). Increased risk of bone fractures. Hypothalamic-pituitary-adrenal dysregulation. Increased risk of overdose.  RISKS: Respiratory depression and death: Opioids increase the risk of respiratory depression and death.  Drug-to-drug interactions: Opioids are relatively contraindicated in combination with benzodiazepines, sleep inducers, and other central nervous system depressants. Other classes of medications  (i.e.: certain antibiotics and even over-the-counter medications) may also trigger or induce respiratory depression in some patients.  Medical conditions: Patients with pre-existing respiratory problems are at higher risk of respiratory failure and/or depression when in combination with opioid analgesics. Opioids are relatively contraindicated in some medical conditions such as central sleep apnea.   Fractures and Falls:  Opioids increase the risk and incidence of falls. This is of particular importance in elderly patients.  Endocrine System:  Long-term administration is associated with endocrine abnormalities (endocrinopathies). (Also known as Opioid-induced Endocrinopathy) Influences on both the hypothalamic-pituitary-adrenal axis?and the hypothalamic-pituitary-gonadal axis have been demonstrated with consequent hypogonadism and adrenal insufficiency in both sexes. Hypogonadism and decreased levels of dehydroepiandrosterone sulfate have been reported in men and women. Endocrine effects include: Amenorrhoea in women (abnormal absence of menstruation) Reduced libido in both sexes Decreased sexual function Erectile dysfunction in men Hypogonadisms (decreased testicular function with shrinkage of testicles) Infertility Depression and fatigue Loss of muscle mass Anxiety Depression Immune suppression Hyperalgesia Weight gain Anemia Osteoporosis Patients (particularly women of childbearing age) should avoid opioids. There is insufficient evidence to recommend routine monitoring of asymptomatic patients taking opioids in the long-term for hormonal deficiencies.  Immune System: Human studies have demonstrated that opioids have an immunomodulating effect. These effects are mediated via opioid receptors both on immune effector cells and in the central nervous system. Opioids have been demonstrated to have adverse effects on antimicrobial response and anti-tumour surveillance. Buprenorphine has  been demonstrated to have no impact on immune function.  Opioid Induced Hyperalgesia: Human studies have demonstrated that prolonged use of opioids can lead to a state of abnormal pain sensitivity, sometimes called opioid induced hyperalgesia (OIH). Opioid induced hyperalgesia is not usually seen in the absence of tolerance to opioid analgesia. Clinically, hyperalgesia may be diagnosed if the patient on long-term opioid therapy presents with increased pain. This might be qualitatively and anatomically distinct from pain related to disease progression or to breakthrough pain resulting from development of opioid tolerance. Pain associated with hyperalgesia tends to be more diffuse than the pre-existing pain and less defined in quality. Management of opioid induced hyperalgesia requires opioid dose reduction.  Cancer: Chronic opioid therapy has been associated with an increased risk of cancer   among noncancer patients with chronic pain. This association was more evident in chronic strong opioid users. Chronic opioid consumption causes significant pathological changes in the small intestine and colon. Epidemiological studies have found that there is a link between opium dependence and initiation of gastrointestinal cancers. Cancer is the second leading cause of death after cardiovascular disease. Chronic use of opioids can cause multiple conditions such as GERD, immunosuppression and renal damage as well as carcinogenic effects, which are associated with the incidence of cancers.   Mortality: Long-term opioid use has been associated with increased mortality among patients with chronic non-cancer pain (CNCP).  Prescription of long-acting opioids for chronic noncancer pain was associated with a significantly increased risk of all-cause mortality, including deaths from causes other than overdose.  Reference: Von Korff M, Kolodny A, Deyo RA, Chou R. Long-term opioid therapy reconsidered. Ann Intern Med. 2011  Sep 6;155(5):325-8. doi: 10.7326/[id number]. PMID: 21893626; PMCID: PMC3280085. Bedson J, Chen Y, Ashworth J, Hayward RA, Dunn KM, Jordan KP. Risk of adverse events in patients prescribed long-term opioids: A cohort study in the [place] Clinical Practice Research Datalink. Eur J Pain. 2019 May;23(5):908-922. doi: 10.1002/ejp.1357. Epub 2019 Jan 31. PMID: 30620116. Colameco S, Coren JS, Ciervo CA. Continuous opioid treatment for chronic noncancer pain: a time for moderation in prescribing. Postgrad Med. 2009 Jul;121(4):61-6. doi: 10.3810/pgm.2009.07.2032. PMID: 19641271. Chou R, Turner JA, Devine EB, Hansen RN, Sullivan SD, Blazina I, Dana T, Bougatsos C, Deyo RA. The effectiveness and risks of long-term opioid therapy for chronic pain: a systematic review for a National Institutes of Health Pathways to Prevention Workshop. Ann Intern Med. 2015 Feb 17;162(4):276-86. doi: 10.7326/M14-2559. PMID: 25581257. Warner M, Chen LH, Makuc DM. NCHS Data Brief No. 22. [place]: Centers for Disease Control and Prevention; 2009. Sep, Increase in Fatal Poisonings Involving Opioid Analgesics in the [place], 1999-2006. Song IA, Choi HR, Oh TK. Long-term opioid use and mortality in patients with chronic non-cancer pain: Ten-year follow-up study in [place] from 2010 through 2019. EClinicalMedicine. 2022 Jul 18;51:101558. doi: 10.1016/j.eclinm.2022.101558. PMID: 35875817; PMCID: PMC9304910. Huser, W., Schubert, T., Vogelmann, T. et al. All-cause mortality in patients with long-term opioid therapy compared with non-opioid analgesics for chronic non-cancer pain: a database study. BMC Med 18, 162 (2020). https://doi.org/10.1186/[email or web address] Rashidian H, Zendehdel K, Kamangar F, Malekzadeh R, Haghdoost AA. An Ecological Study of the Association between Opiate Use and Incidence of Cancers. Addict Health. 2016 Fall;8(4):252-260. PMID: 28819556; PMCID: PMC5554805.  Our Goal: Our goal is to control your  pain with means other than the use of opioid pain medications.  Our Recommendation: Talk to your physician about coming off of these medications. We can assist you with the tapering down and stopping these medicines. Based on the new information, even if you cannot completely stop the medication, a decrease in the dose may be associated with a lesser risk. Ask for other means of controlling the pain. Decrease or eliminate those factors that significantly contribute to your pain such as smoking, obesity, and a diet heavily tilted towards "inflammatory" nutrients.  Last Updated: 06/25/2023   ____________________________________________________________________________________________     ____________________________________________________________________________________________  Transfer of Pain Medication between Pharmacies  Re: 2023 DEA Clarification on existing regulation  Published on DEA Website: August 18, 2022  Title: Revised Regulation Allows DEA-Registered Pharmacies to Transfer Electronic Prescriptions at a Patient's Request DEA Headquarters Division - Public Information Office  "Patients now have the ability to request their electronic prescription be transferred to another pharmacy without having to go back to their practitioner to initiate the   request. This revised regulation went into effect on Monday, August 14, 2022.     At a patient's request, a DEA-registered retail pharmacy can now transfer an electronic prescription for a controlled substance (schedules II-V) to another DEA-registered retail pharmacy. Prior to this change, patients would have to go through their practitioner to cancel their prescription and have it re-issued to a different pharmacy. The process was taxing and time consuming for both patients and practitioners.    The Drug Enforcement Administration (DEA) published its intent to revise the process for transferring electronic prescriptions on November 05, 2020.  The final rule was published in the federal register on July 13, 2022 and went into effect 30 days later.  Under the final rule, a prescription can only be transferred once between pharmacies, and only if allowed under existing state or other applicable law. The prescription must remain in its electronic form; may not be altered in any way; and the transfer must be communicated directly between two licensed pharmacists. It's important to note, any authorized refills transfer with the original prescription, which means the entire prescription will be filled at the same pharmacy."    REFERENCES: 1. DEA website announcement https://www.dea.gov/stories/2023/2023-08/2022-09-01/revised-regulation-allows-dea-registered-pharmacies-transfer  2. Department of Justice website  https://www.govinfo.gov/content/pkg/FR-2022-07-13/pdf/2023-15847.pdf  3. DEPARTMENT OF JUSTICE Drug Enforcement Administration 21 CFR Part 1306 [Docket No. DEA-637] RIN [id number]-AB64 "Transfer of Electronic Prescriptions for Schedules II-V Controlled Substances Between Pharmacies for Initial Filling"  ____________________________________________________________________________________________     _______________________________________________________________________  Medication Rules  Purpose: To inform patients, and their family members, of our medication rules and regulations.  Applies to: All patients receiving prescriptions from our practice (written or electronic).  Pharmacy of record: This is the pharmacy where your electronic prescriptions will be sent. Make sure we have the correct one.  Electronic prescriptions: In compliance with the  Strengthen Opioid Misuse Prevention (STOP) Act of 2017 (Session Law 2017-74/H243), effective December 18, 2018, all controlled substances must be electronically prescribed. Written prescriptions, faxing, or calling prescriptions to a pharmacy will no longer be  done.  Prescription refills: These will be provided only during in-person appointments. No medications will be renewed without a "face-to-face" evaluation with your provider. Applies to all prescriptions.  NOTE: The following applies primarily to controlled substances (Opioid* Pain Medications).   Type of encounter (visit): For patients receiving controlled substances, face-to-face visits are required. (Not an option and not up to the patient.)  Patient's responsibilities: Pain Pills: Bring all pain pills to every appointment (except for procedure appointments). Pill Bottles: Bring pills in original pharmacy bottle. Bring bottle, even if empty. Always bring the bottle of the most recent fill.  Medication refills: You are responsible for knowing and keeping track of what medications you are taking and when is it that you will need a refill. The day before your appointment: write a list of all prescriptions that need to be refilled. The day of the appointment: give the list to the admitting nurse. Prescriptions will be written only during appointments. No prescriptions will be written on procedure days. If you forget a medication: it will not be "Called in", "Faxed", or "electronically sent". You will need to get another appointment to get these prescribed. No early refills. Do not call asking to have your prescription filled early. Partial  or short prescriptions: Occasionally your pharmacy may not have enough pills to fill your prescription.  NEVER ACCEPT a partial fill or a prescription that is short of the total amount of pills that you were prescribed.    With controlled substances the law allows 72 hours for the pharmacy to complete the prescription.  If the prescription is not completed within 72 hours, the pharmacist will require a new prescription to be written. This means that you will be short on your medicine and we WILL NOT send another prescription to complete your original prescription.   Instead, request the pharmacy to send a carrier to a nearby branch to get enough medication to provide you with your full prescription. Prescription Accuracy: You are responsible for carefully inspecting your prescriptions before leaving our office. Have the discharge nurse carefully go over each prescription with you, before taking them home. Make sure that your name is accurately spelled, that your address is correct. Check the name and dose of your medication to make sure it is accurate. Check the number of pills, and the written instructions to make sure they are clear and accurate. Make sure that you are given enough medication to last until your next medication refill appointment. Taking Medication: Take medication as prescribed. When it comes to controlled substances, taking less pills or less frequently than prescribed is permitted and encouraged. Never take more pills than instructed. Never take the medication more frequently than prescribed.  Inform other Doctors: Always inform, all of your healthcare providers, of all the medications you take. Pain Medication from other Providers: You are not allowed to accept any additional pain medication from any other Doctor or Healthcare provider. There are two exceptions to this rule. (see below) In the event that you require additional pain medication, you are responsible for notifying us, as stated below. Cough Medicine: Often these contain an opioid, such as codeine or hydrocodone. Never accept or take cough medicine containing these opioids if you are already taking an opioid* medication. The combination may cause respiratory failure and death. Medication Agreement: You are responsible for carefully reading and following our Medication Agreement. This must be signed before receiving any prescriptions from our practice. Safely store a copy of your signed Agreement. Violations to the Agreement will result in no further prescriptions. (Additional copies of  our Medication Agreement are available upon request.) Laws, Rules, & Regulations: All patients are expected to follow all Federal and State Laws, Statutes, Rules, & Regulations. Ignorance of the Laws does not constitute a valid excuse.  Illegal drugs and Controlled Substances: The use of illegal substances (including, but not limited to marijuana and its derivatives) and/or the illegal use of any controlled substances is strictly prohibited. Violation of this rule may result in the immediate and permanent discontinuation of any and all prescriptions being written by our practice. The use of any illegal substances is prohibited. Adopted CDC guidelines & recommendations: Target dosing levels will be at or below 60 MME/day. Use of benzodiazepines** is not recommended.  Exceptions: There are only two exceptions to the rule of not receiving pain medications from other Healthcare Providers. Exception #1 (Emergencies): In the event of an emergency (i.e.: accident requiring emergency care), you are allowed to receive additional pain medication. However, you are responsible for: As soon as you are able, call our office [phone], at any time of the day or night, and leave a message stating your name, the date and nature of the emergency, and the name and dose of the medication prescribed. In the event that your call is answered by a member of our staff, make sure to document and save the date, time, and the name of the person that took your information.  Exception #2 (  Planned Surgery): In the event that you are scheduled by another doctor or dentist to have any type of surgery or procedure, you are allowed (for a period no longer than 30 days), to receive additional pain medication, for the acute post-op pain. However, in this case, you are responsible for picking up a copy of our "Post-op Pain Management for Surgeons" handout, and giving it to your surgeon or dentist. This document is available at our office,  and does not require an appointment to obtain it. Simply go to our office during business hours (Monday-Thursday from 8:00 AM to 4:00 PM) (Friday 8:00 AM to 12:00 Noon) or if you have a scheduled appointment with us, prior to your surgery, and ask for it by name. In addition, you are responsible for: calling our office [phone], at any time of the day or night, and leaving a message stating your name, name of your surgeon, type of surgery, and date of procedure or surgery. Failure to comply with your responsibilities may result in termination of therapy involving the controlled substances. Medication Agreement Violation. Following the above rules, including your responsibilities will help you in avoiding a Medication Agreement Violation ("Breaking your Pain Medication Contract").  Consequences:  Not following the above rules may result in permanent discontinuation of medication prescription therapy.  *Opioid medications include: morphine, codeine, oxycodone, oxymorphone, hydrocodone, hydromorphone, meperidine, tramadol, tapentadol, buprenorphine, fentanyl, methadone. **Benzodiazepine medications include: diazepam (Valium), alprazolam (Xanax), clonazepam (Klonopine), lorazepam (Ativan), clorazepate (Tranxene), chlordiazepoxide (Librium), estazolam (Prosom), oxazepam (Serax), temazepam (Restoril), triazolam (Halcion) (Last updated: 10/10/2022) ______________________________________________________________________    ______________________________________________________________________  Medication Recommendations and Reminders  Applies to: All patients receiving prescriptions (written and/or electronic).  Medication Rules & Regulations: You are responsible for reading, knowing, and following our "Medication Rules" document. These exist for your safety and that of others. They are not flexible and neither are we. Dismissing or ignoring them is an act of "non-compliance" that may result in  complete and irreversible termination of such medication therapy. For safety reasons, "non-compliance" will not be tolerated. As with the [place] fundamental legal principle of "ignorance of the law is no defense", we will accept no excuses for not having read and knowing the content of documents provided to you by our practice.  Pharmacy of record:  Definition: This is the pharmacy where your electronic prescriptions will be sent.  We do not endorse any particular pharmacy. It is up to you and your insurance to decide what pharmacy to use.  We do not restrict you in your choice of pharmacy. However, once we write for your prescriptions, we will NOT be re-sending more prescriptions to fix restricted supply problems created by your pharmacy, or your insurance.  The pharmacy listed in the electronic medical record should be the one where you want electronic prescriptions to be sent. If you choose to change pharmacy, simply notify our nursing staff. Changes will be made only during your regular appointments and not over the phone.  Recommendations: Keep all of your pain medications in a safe place, under lock and key, even if you live alone. We will NOT replace lost, stolen, or damaged medication. We do not accept "Police Reports" as proof of medications having been stolen. After you fill your prescription, take 1 week's worth of pills and put them away in a safe place. You should keep a separate, properly labeled bottle for this purpose. The remainder should be kept in the original bottle. Use this as your primary supply, until it runs out.   Once it's gone, then you know that you have 1 week's worth of medicine, and it is time to come in for a prescription refill. If you do this correctly, it is unlikely that you will ever run out of medicine. To make sure that the above recommendation works, it is very important that you make sure your medication refill appointments are scheduled at least 1 week before you  run out of medicine. To do this in an effective manner, make sure that you do not leave the office without scheduling your next medication management appointment. Always ask the nursing staff to show you in your prescription , when your medication will be running out. Then arrange for the receptionist to get you a return appointment, at least 7 days before you run out of medicine. Do not wait until you have 1 or 2 pills left, to come in. This is very poor planning and does not take into consideration that we may need to cancel appointments due to bad weather, sickness, or emergencies affecting our staff. DO NOT ACCEPT A "Partial Fill": If for any reason your pharmacy does not have enough pills/tablets to completely fill or refill your prescription, do not allow for a "partial fill". The law allows the pharmacy to complete that prescription within 72 hours, without requiring a new prescription. If they do not fill the rest of your prescription within those 72 hours, you will need a separate prescription to fill the remaining amount, which we will NOT provide. If the reason for the partial fill is your insurance, you will need to talk to the pharmacist about payment alternatives for the remaining tablets, but again, DO NOT ACCEPT A PARTIAL FILL, unless you can trust your pharmacist to obtain the remainder of the pills within 72 hours.  Prescription refills and/or changes in medication(s):  Prescription refills, and/or changes in dose or medication, will be conducted only during scheduled medication management appointments. (Applies to both, written and electronic prescriptions.) No refills on procedure days. No medication will be changed or started on procedure days. No changes, adjustments, and/or refills will be conducted on a procedure day. Doing so will interfere with the diagnostic portion of the procedure. No phone refills. No medications will be "called into the pharmacy". No Fax refills. No weekend  refills. No Holliday refills. No after hours refills.  Remember:  Business hours are:  Monday to Thursday 8:00 AM to 4:00 PM Provider's Schedule: Mindee Robledo, MD - Appointments are:  Medication management: Monday and Wednesday 8:00 AM to 4:00 PM Procedure day: Tuesday and Thursday 7:30 AM to 4:00 PM Bilal Lateef, MD - Appointments are:  Medication management: Tuesday and Thursday 8:00 AM to 4:00 PM Procedure day: Monday and Wednesday 7:30 AM to 4:00 PM (Last update: 10/10/2022) ______________________________________________________________________   ____________________________________________________________________________________________  Naloxone Nasal Spray  Why am I receiving this medication? [place] STOP ACT requires that all patients taking high dose opioids or at risk of opioids respiratory depression, be prescribed an opioid reversal agent, such as Naloxone (AKA: Narcan).  What is this medication? NALOXONE (nal OX one) treats opioid overdose, which causes slow or shallow breathing, severe drowsiness, or trouble staying awake. Call emergency services after using this medication. You may need additional treatment. Naloxone works by reversing the effects of opioids. It belongs to a group of medications called opioid blockers.  COMMON BRAND NAME(S): Kloxxado, Narcan  What should I tell my care team before I take this medication? They need to know if you have   any of these conditions: Heart disease Substance use disorder An unusual or allergic reaction to naloxone, other medications, foods, dyes, or preservatives Pregnant or trying to get pregnant Breast-feeding  When to use this medication? This medication is to be used for the treatment of respiratory depression (less than 8 breaths per minute) secondary to opioid overdose.   How to use this medication? This medication is for use in the nose. Lay the person on their back. Support their neck with your hand  and allow the head to tilt back before giving the medication. The nasal spray should be given into 1 nostril. After giving the medication, move the person onto their side. Do not remove or test the nasal spray until ready to use. Get emergency medical help right away after giving the first dose of this medication, even if the person wakes up. You should be familiar with how to recognize the signs and symptoms of a narcotic overdose. If more doses are needed, give the additional dose in the other nostril. Talk to your care team about the use of this medication in children. While this medication may be prescribed for children as young as newborns for selected conditions, precautions do apply.  Naloxone Overdosage: If you think you have taken too much of this medicine contact a poison control center or emergency room at once.  NOTE: This medicine is only for you. Do not share this medicine with others.  What if I miss a dose? This does not apply.  What may interact with this medication? This is only used during an emergency. No interactions are expected during emergency use. This list may not describe all possible interactions. Give your health care provider a list of all the medicines, herbs, non-prescription drugs, or dietary supplements you use. Also tell them if you smoke, drink alcohol, or use illegal drugs. Some items may interact with your medicine.  What should I watch for while using this medication? Keep this medication ready for use in the case of an opioid overdose. Make sure that you have the phone number of your care team and local hospital ready. You may need to have additional doses of this medication. Each nasal spray contains a single dose. Some emergencies may require additional doses. After use, bring the treated person to the nearest hospital or call 911. Make sure the treating care team knows that the person has received a dose of this medication. You will receive additional  instructions on what to do during and after use of this medication before an emergency occurs.  What side effects may I notice from receiving this medication? Side effects that you should report to your care team as soon as possible: Allergic reactions--skin rash, itching, hives, swelling of the face, lips, tongue, or throat Side effects that usually do not require medical attention (report these to your care team if they continue or are bothersome): Constipation Dryness or irritation inside the nose Headache Increase in blood pressure Muscle spasms Stuffy nose Toothache This list may not describe all possible side effects. Call your doctor for medical advice about side effects. You may report side effects to FDA at 1-800-FDA-1088.  Where should I keep my medication? Because this is an emergency medication, you should keep it with you at all times.  Keep out of the reach of children and pets. Store between 20 and 25 degrees C (68 and 77 degrees F). Do not freeze. Throw away any unused medication after the expiration date. Keep in original box   until ready to use.  NOTE: This sheet is a summary. It may not cover all possible information. If you have questions about this medicine, talk to your doctor, pharmacist, or health care provider.   2023 [employer] Standard (2021-08-12 00:00:00)  ____________________________________________________________________________________________   

## 2023-06-29 LAB — TOXASSURE SELECT 13 (MW), URINE

## 2023-07-03 ENCOUNTER — Ambulatory Visit: Payer: Medicare HMO | Attending: Pain Medicine | Admitting: Pain Medicine

## 2023-07-03 ENCOUNTER — Telehealth: Payer: Self-pay

## 2023-07-03 ENCOUNTER — Ambulatory Visit
Admission: RE | Admit: 2023-07-03 | Discharge: 2023-07-03 | Disposition: A | Discharge status: Home / Self Care | Payer: Medicare HMO | Source: Ambulatory Visit | Attending: Pain Medicine | Admitting: Pain Medicine

## 2023-07-03 ENCOUNTER — Encounter: Payer: Self-pay | Admitting: Pain Medicine

## 2023-07-03 VITALS — BP 174/93 | HR 57 | Temp 97.1°F | Ht 64.0 in | Wt 173.0 lb

## 2023-07-03 DIAGNOSIS — R29898 Other symptoms and signs involving the musculoskeletal system: Secondary | ICD-10-CM | POA: Insufficient documentation

## 2023-07-03 DIAGNOSIS — M5441 Lumbago with sciatica, right side: Secondary | ICD-10-CM | POA: Insufficient documentation

## 2023-07-03 DIAGNOSIS — M5431 Sciatica, right side: Secondary | ICD-10-CM | POA: Insufficient documentation

## 2023-07-03 DIAGNOSIS — M4807 Spinal stenosis, lumbosacral region: Secondary | ICD-10-CM | POA: Diagnosis present

## 2023-07-03 DIAGNOSIS — M79604 Pain in right leg: Secondary | ICD-10-CM | POA: Insufficient documentation

## 2023-07-03 DIAGNOSIS — M5416 Radiculopathy, lumbar region: Secondary | ICD-10-CM | POA: Insufficient documentation

## 2023-07-03 DIAGNOSIS — M5137 Other intervertebral disc degeneration, lumbosacral region: Secondary | ICD-10-CM | POA: Insufficient documentation

## 2023-07-03 DIAGNOSIS — M5417 Radiculopathy, lumbosacral region: Secondary | ICD-10-CM | POA: Insufficient documentation

## 2023-07-03 DIAGNOSIS — M48062 Spinal stenosis, lumbar region with neurogenic claudication: Secondary | ICD-10-CM | POA: Diagnosis present

## 2023-07-03 DIAGNOSIS — M48061 Spinal stenosis, lumbar region without neurogenic claudication: Secondary | ICD-10-CM | POA: Diagnosis not present

## 2023-07-03 DIAGNOSIS — M5410 Radiculopathy, site unspecified: Secondary | ICD-10-CM | POA: Diagnosis not present

## 2023-07-03 DIAGNOSIS — G8929 Other chronic pain: Secondary | ICD-10-CM | POA: Insufficient documentation

## 2023-07-03 MED ORDER — DEXAMETHASONE SODIUM PHOSPHATE 10 MG/ML IJ SOLN
INTRAMUSCULAR | Status: AC
  Filled 2023-07-03: qty 1

## 2023-07-03 MED ORDER — LIDOCAINE HCL 2 % IJ SOLN
20.0000 mL | Freq: Once | INTRAMUSCULAR | Status: AC
  Administered 2023-07-03: 400 mg
  Filled 2023-07-03: qty 20

## 2023-07-03 MED ORDER — LACTATED RINGERS IV SOLN: Freq: Once | INTRAVENOUS | Status: DC

## 2023-07-03 MED ORDER — IOHEXOL 180 MG/ML  SOLN
10.0000 mL | Freq: Once | INTRAMUSCULAR | Status: AC
  Administered 2023-07-03: 10 mL via INTRATHECAL
  Filled 2023-07-03: qty 20

## 2023-07-03 MED ORDER — PENTAFLUOROPROP-TETRAFLUOROETH EX AERO
INHALATION_SPRAY | Freq: Once | CUTANEOUS | Status: DC
  Filled 2023-07-03: qty 116

## 2023-07-03 MED ORDER — ROPIVACAINE HCL 2 MG/ML IJ SOLN
2.0000 mL | Freq: Once | INTRAMUSCULAR | Status: AC
  Administered 2023-07-03: 2 mL via EPIDURAL
  Filled 2023-07-03: qty 20

## 2023-07-03 MED ORDER — DEXAMETHASONE SODIUM PHOSPHATE 10 MG/ML IJ SOLN
20.0000 mg | Freq: Once | INTRAMUSCULAR | Status: AC
  Administered 2023-07-03: 20 mg
  Filled 2023-07-03: qty 2

## 2023-07-03 MED ORDER — SODIUM CHLORIDE 0.9% FLUSH
2.0000 mL | Freq: Once | INTRAVENOUS | Status: AC
  Administered 2023-07-03: 2 mL

## 2023-07-03 MED ORDER — MIDAZOLAM HCL 2 MG/2ML IJ SOLN: 0.5000 mg | Freq: Once | INTRAMUSCULAR | Status: DC

## 2023-07-03 NOTE — Patient Instructions (Signed)

## 2023-07-03 NOTE — Telephone Encounter (Signed)
Called patient to inform her that her medication change will be made on her next med refill appt and to make sure she brings med bottle with her. No answer, left message and instructed her to call with any questions.

## 2023-07-03 NOTE — Progress Notes (Signed)
PROVIDER NOTE: Interpretation of information contained herein should be left to medically-trained personnel. Specific patient instructions are provided elsewhere under "Patient Instructions" section of medical record. This document was created in part using STT-dictation technology, any transcriptional errors that may result from this process are unintentional.  Patient: Wanda Martinez Type: Established DOB: 1935/03/21 MRN: [id number] PCP: Dale [place], MD  Service: Procedure DOS: 07/03/2023 Setting: Ambulatory Location: Ambulatory outpatient facility Delivery: Face-to-face Provider: Oswaldo Done, MD Specialty: Interventional Pain Management Specialty designation: 09 Location: Outpatient facility Ref. Prov.: Dale [place], MD       Interventional Therapy   Procedure: Lumbar trans-foraminal epidural steroid injection (L-TFESI)  R5L2   Laterality: Bilateral (-50) (Right L5) and (Left L2)  Level: L5 & L2 nerve root(s) Imaging: Fluoroscopy-guided         Anesthesia: Local anesthesia (1-2% Lidocaine) Anxiolysis: IV Versed         Sedation: No Sedation                       DOS: 07/03/2023  Performed by: Oswaldo Done, MD  Purpose: Diagnostic/Therapeutic Indications: Lumbar radicular pain severe enough to impact quality of life or function. 1. Chronic low back pain (Right) w/ sciatica (Right)   2. Chronic lower extremity pain (Right)   3. Chronic lumbar radicular pain (Right)   4. Chronic sciatica (Right)   5. DDD (degenerative disc disease), lumbosacral   6. Lower extremity weakness (Right)   7. Lumbar foraminal stenosis (Right: L3-4, L4-5, L5-S1) (Left: L2-3)   8. Lumbar radicular pain (Bilateral)   9. Lumbar spinal stenosis (with neurogenic claudication) (L3-4)   10. 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, L4-5, L5-S1) (Right)   11. Lumbosacral radiculopathy at L5 (Right)    NAS-11 Pain score:   Pre-procedure: 8 /10   Post-procedure: 8 /10     Position /  Prep / Materials:  Position: Prone  Prep solution: DuraPrep (Iodine Povacrylex [0.7% available iodine] and Isopropyl Alcohol, 74% w/w) Prep Area: Entire Posterior Lumbosacral Area.  From the lower tip of the scapula down to the tailbone and from flank to flank. Materials:  Tray: Block Needle(s):  Type: Spinal  Gauge (G): 22  Length: 5-in  Qty: 2      H&P (Pre-op Assessment):  Wanda Martinez is a 88 y.o. (year old), female patient, seen today for interventional treatment. She  has a past surgical history that includes Tonsillectomy and adenoidectomy (1979); Vesicovaginal fistula closure w/ TAH (1983); Breast surgery (1986); Breast enhancement surgery (1987); Breast implant removal; Breast implant removal (Right, 08/29/2012); Mastectomy (08/2012); Abdominal hysterectomy; Colonoscopy with propofol (N/A, 09/13/2016); Breast biopsy (2013); and Blepharoplasty. Wanda Martinez has a current medication list which includes the following prescription(s): amlodipine, aspirin ec, calcium carbonate, ciprofloxacin, citalopram, cyclobenzaprine, donepezil, erythromycin, gabapentin, glucosamine-chondroitin, hydrocodone-acetaminophen, hydrocortisone, letrozole, losartan, lubiprostone, magnesium oxide, melatonin, metoprolol succinate, mirabegron er, multivitamin, naloxone, nitrofurantoin (macrocrystal-monohydrate), omega-3, pantoprazole, and rosuvastatin, and the following Facility-Administered Medications: dexamethasone, iohexol, lactated ringers, lidocaine, midazolam, pentafluoroprop-tetrafluoroeth, ropivacaine (pf) 2 mg/ml (0.2%), and sodium chloride flush. Her primarily concern today is the Back Pain (lower)  Initial Vital Signs:  Pulse/HCG Rate: 69  Temp: (!) 97.1 F (36.2 C) Resp:   BP: (!) 150/73 SpO2: 100 %  BMI: Estimated body mass index is 29.7 kg/m as calculated from the following:   Height as of this encounter: 5\' 4"  (1.626 m).   Weight as of this encounter: 173 lb (78.5 kg).  Risk  Assessment: Allergies: Reviewed. She is allergic to sulfa  antibiotics and vesicare [solifenacin].  Allergy Precautions: None required Coagulopathies: Reviewed. None identified.  Blood-thinner therapy: None at this time Active Infection(s): Reviewed. None identified. Wanda Martinez is afebrile  Site Confirmation: Wanda Martinez was asked to confirm the procedure and laterality before marking the site Procedure checklist: Completed Consent: Before the procedure and under the influence of no sedative(s), amnesic(s), or anxiolytics, the patient was informed of the treatment options, risks and possible complications. To fulfill our ethical and legal obligations, as recommended by the American Medical Association's Code of Ethics, I have informed the patient of my clinical impression; the nature and purpose of the treatment or procedure; the risks, benefits, and possible complications of the intervention; the alternatives, including doing nothing; the risk(s) and benefit(s) of the alternative treatment(s) or procedure(s); and the risk(s) and benefit(s) of doing nothing. The patient was provided information about the general risks and possible complications associated with the procedure. These may include, but are not limited to: failure to achieve desired goals, infection, bleeding, organ or nerve damage, allergic reactions, paralysis, and death. In addition, the patient was informed of those risks and complications associated to Spine-related procedures, such as failure to decrease pain; infection (i.e.: Meningitis, epidural or intraspinal abscess); bleeding (i.e.: epidural hematoma, subarachnoid hemorrhage, or any other type of intraspinal or peri-dural bleeding); organ or nerve damage (i.e.: Any type of peripheral nerve, nerve root, or spinal cord injury) with subsequent damage to sensory, motor, and/or autonomic systems, resulting in permanent pain, numbness, and/or weakness of one or several areas of the body;  allergic reactions; (i.e.: anaphylactic reaction); and/or death. Furthermore, the patient was informed of those risks and complications associated with the medications. These include, but are not limited to: allergic reactions (i.e.: anaphylactic or anaphylactoid reaction(s)); adrenal axis suppression; blood sugar elevation that in diabetics may result in ketoacidosis or comma; water retention that in patients with history of congestive heart failure may result in shortness of breath, pulmonary edema, and decompensation with resultant heart failure; weight gain; swelling or edema; medication-induced neural toxicity; particulate matter embolism and blood vessel occlusion with resultant organ, and/or nervous system infarction; and/or aseptic necrosis of one or more joints. Finally, the patient was informed that Medicine is not an exact science; therefore, there is also the possibility of unforeseen or unpredictable risks and/or possible complications that may result in a catastrophic outcome. The patient indicated having understood very clearly. We have given the patient no guarantees and we have made no promises. Enough time was given to the patient to ask questions, all of which were answered to the patient's satisfaction. Wanda Martinez has indicated that she wanted to continue with the procedure. Attestation: I, the ordering provider, attest that I have discussed with the patient the benefits, risks, side-effects, alternatives, likelihood of achieving goals, and potential problems during recovery for the procedure that I have provided informed consent. Date  Time: 07/03/2023 10:57 AM   Pre-Procedure Preparation:  Monitoring: As per clinic protocol. Respiration, ETCO2, SpO2, BP, heart rate and rhythm monitor placed and checked for adequate function Safety Precautions: Patient was assessed for positional comfort and pressure points before starting the procedure. Time-out: I initiated and conducted the  "Time-out" before starting the procedure, as per protocol. The patient was asked to participate by confirming the accuracy of the "Time Out" information. Verification of the correct person, site, and procedure were performed and confirmed by me, the nursing staff, and the patient. "Time-out" conducted as per Joint Commission's Universal Protocol (UP.01.01.01). Time:   Start  Time:   hrs.  Description/Narrative of Procedure:          Target: The 6 o'clock position under the pedicle, on the affected side. Region: Posterolateral Lumbosacral Approach: Posterior Percutaneous Paravertebral approach.  Rationale (medical necessity): procedure needed and proper for the diagnosis and/or treatment of the patient's medical symptoms and needs. Procedural Technique Safety Precautions: Aspiration looking for blood return was conducted prior to all injections. At no point did we inject any substances, as a needle was being advanced. No attempts were made at seeking any paresthesias. Safe injection practices and needle disposal techniques used. Medications properly checked for expiration dates. SDV (single dose vial) medications used. Description of the Procedure: Protocol guidelines were followed. The patient was placed in position over the procedure table. The target area was identified and the area prepped in the usual manner. Skin & deeper tissues infiltrated with local anesthetic. Appropriate amount of time allowed to pass for local anesthetics to take effect. The procedure needles were then advanced to the target area. Proper needle placement secured. Negative aspiration confirmed. Solution injected in intermittent fashion, asking for systemic symptoms every 0.5cc of injectate. The needles were then removed and the area cleansed, making sure to leave some of the prepping solution back to take advantage of its long term bactericidal properties.  Vitals:   07/03/23 1056  BP: (!) 150/73  Pulse: 69  Temp: (!) 97.1  F (36.2 C)  TempSrc: Temporal  SpO2: 100%  Weight: 173 lb (78.5 kg)  Height: 5\' 4"  (1.626 m)    Start Time:   hrs. End Time:   hrs.  Imaging Guidance (Spinal):          Type of Imaging Technique: Fluoroscopy Guidance (Spinal) Indication(s): Assistance in needle guidance and placement for procedures requiring needle placement in or near specific anatomical locations not easily accessible without such assistance. Exposure Time: Please see nurses notes. Contrast: Before injecting any contrast, we confirmed that the patient did not have an allergy to iodine, shellfish, or radiological contrast. Once satisfactory needle placement was completed at the desired level, radiological contrast was injected. Contrast injected under live fluoroscopy. No contrast complications. See chart for type and volume of contrast used. Fluoroscopic Guidance: I was personally present during the use of fluoroscopy. "Tunnel Vision Technique" used to obtain the best possible view of the target area. Parallax error corrected before commencing the procedure. "Direction-depth-direction" technique used to introduce the needle under continuous pulsed fluoroscopy. Once target was reached, antero-posterior, oblique, and lateral fluoroscopic projection used confirm needle placement in all planes. Images permanently stored in EMR. Interpretation: I personally interpreted the imaging intraoperatively. Adequate needle placement confirmed in multiple planes. Appropriate spread of contrast into desired area was observed. No evidence of afferent or efferent intravascular uptake. No intrathecal or subarachnoid spread observed. Permanent images saved into the patient's record.  Post-operative Assessment:  Post-procedure Vital Signs:  Pulse/HCG Rate: 69  Temp: (!) 97.1 F (36.2 C) Resp:   BP: (!) 150/73 SpO2: 100 %  EBL: None  Complications: No immediate post-treatment complications observed by team, or reported by  patient.  Note: The patient tolerated the entire procedure well. A repeat set of vitals were taken after the procedure and the patient was kept under observation following institutional policy, for this type of procedure. Post-procedural neurological assessment was performed, showing return to baseline, prior to discharge. The patient was provided with post-procedure discharge instructions, including a section on how to identify potential problems. Should any problems arise concerning this  procedure, the patient was given instructions to immediately contact us, at any time, without hesitation. In any case, we plan to contact the patient by telephone for a follow-up status report regarding this interventional procedure.  Comments:  No additional relevant information.  Plan of Care (POC)  Orders:  Orders Placed This Encounter  Procedures   Lumbar Transforaminal Epidural    Scheduling Instructions:     Laterality: Bilateral (Right L5) and (Left L2)     Level(s): L5 & L2     Sedation: Patient's choice.     Timeframe: Today    Order Specific Question:   Where will this procedure be performed?    Answer:   [facility]   DG [facility] C-ARM 1-60 MIN NO REPORT    Intraoperative interpretation by procedural physician at [facility].    Standing Status:   Standing    Number of Occurrences:   1    Order Specific Question:   Reason for exam:    Answer:   Assistance in needle guidance and placement for procedures requiring needle placement in or near specific anatomical locations not easily accessible without such assistance.   Informed Consent Details: Physician/Practitioner Attestation; Transcribe to consent form and obtain patient signature    Provider Attestation: I, Yuliya Nova A. Laban Emperor, MD, (Pain Management Specialist), the physician/practitioner, attest that I have discussed with the patient the benefits, risks, side effects, alternatives, likelihood of achieving goals and  potential problems during recovery for the procedure that I have provided informed consent.    Scheduling Instructions:     Note: Always confirm laterality of pain with [name], before procedure.     Transcribe to consent form and obtain patient signature.    Order Specific Question:   Physician/Practitioner attestation of informed consent for procedure/surgical case    Answer:   I, the physician/practitioner, attest that I have discussed with the patient the benefits, risks, side effects, alternatives, likelihood of achieving goals and potential problems during recovery for the procedure that I have provided informed consent.    Order Specific Question:   Procedure    Answer:   Diagnostic lumbar transforaminal epidural steroid injection under fluoroscopic guidance. (See notes for level and laterality.)    Order Specific Question:   Physician/Practitioner performing the procedure    Answer:   Larah Kuntzman A. Laban Emperor, MD    Order Specific Question:   Indication/Reason    Answer:   Lumbar radiculopathy/radiculitis associated with lumbar stenosis   Provide equipment / supplies at bedside    Procedure tray: "Block Tray" (Disposable  single use) Skin infiltration needle: Regular 1.5-in, 25-G, (x1) Block Needle type: Spinal Amount/quantity: 2 Size: Medium (5-inch) Gauge: 22G    Standing Status:   Standing    Number of Occurrences:   1    Order Specific Question:   Specify    Answer:   Block Tray   Chronic Opioid Analgesic:  Hydrocodone/APAP 5/325, 1 tab PO q 8 hrs (15 mg/day of hydrocodone) MME/day: 15 mg/day.   Medications ordered for procedure: Meds ordered this encounter  Medications   iohexol (OMNIPAQUE) 180 MG/ML injection 10 mL    Must be Myelogram-compatible. If not available, you may substitute with a water-soluble, non-ionic, hypoallergenic, myelogram-compatible radiological contrast medium.   lidocaine (XYLOCAINE) 2 % (with pres) injection 400 mg   pentafluoroprop-tetrafluoroeth  (GEBAUERS) aerosol   lactated ringers infusion   midazolam (VERSED) injection 0.5-2 mg    Make sure Flumazenil is available in the pyxis when  using this medication. If oversedation occurs, administer 0.2 mg IV over 15 sec. If after 45 sec no response, administer 0.2 mg again over 1 min; may repeat at 1 min intervals; not to exceed 4 doses (1 mg)   sodium chloride flush (NS) 0.9 % injection 2 mL    This is for a two (2) level block. Use two (2) syringes and divide content in half.   ropivacaine (PF) 2 mg/mL (0.2%) (NAROPIN) injection 2 mL    This is for a two (2) level block. Use two (2) syringes and divide content in half.   dexamethasone (DECADRON) injection 20 mg    This is for a two (2) level block. Use two (2) syringes and divide content in half.   Medications administered: Kyona T. Strohm had no medications administered during this visit.  See the medical record for exact dosing, route, and time of administration.  Follow-up plan:   No follow-ups on file.       Interventional Therapies  Risk Factors  Considerations:   Parkinson's disease  Hx right breast cancer  OIC  SOB  CKD Stage 3  GERD  depression  osteopenia/osteoporosis     Planned  Pending:   Therapeutic right L5 #5 and left L2 TFESI #2  Therapeutic/palliative right IA knee Zilretta knee injection #2    Under consideration:   Therapeutic right SI joint injection #4  Diagnostic right genicular NB #1  Diagnostic bilateral cervical facet MBB  Therapeutic right L3 and L4 TFESI #1    Completed:   Therapeutic right superior/lateral trapezius muscle TPI x1 (04/19/2021)  Therapeutic right thoracic back TPI x2 (10/17/2018) (DKFUA)  Therapeutic left thoracic back TPI x1 (10/17/2018) (DKFUA)  Therapeutic right quadratus lumborum and erector spinae muscle TPI x1 (02/12/2018)  Therapeutic bilateral lumbar facet MBB (L2-S1) (FCT: L3-4,L4-5,L5-S1) x5 (08/09/2021) (100/100/50)  Therapeutic bilateral lumbar facet MBB (L4-S1)  (FCT: L5-S1) x1 (08/29/2022) (100/100/100 x3 days/R:0L:100)  Therapeutic right lumbar facet MBB (L2-S1) x4 (12/21/2020) (DKFUA)  Therapeutic right lumbar facet RFA (L2-S1) x3 (09/28/2022) (100/100/75/75)  Therapeutic right lumbar facet RFA (L3-S1) x1 (03/01/2023) (100/100/70/70)  Therapeutic left lumbar facet RFA x2 (11/30/2022) (100/60/90/>75)  Diagnostic right SI joint Blk x3 (12/21/2020)  (DKFUA)  Diagnostic left SI joint Blk x2 (04/12/2023) (100/90/20/<25)  Therapeutic right SI joint RFA x4 (03/01/2023) (100/100/70/70)  Therapeutic midline L2-3 LESI x1 (10/04/2017) (100/100/85/>50)  Therapeutic right L3-4 LESI x2 (11/04/2020) (100/100/20/<50)  Therapeutic right L4-5 LESI x2 (03/30/2022) (100/100/75/75)  Therapeutic right L5-S1 LESI x2 (12/04/2019) (100/100/50/75)  Diagnostic/therapeutic right L3 and L4 TFESI x1 (07/27/2022) (100/100/75/50)  Therapeutic right L5 TFESI x4 (02/14/2022) (100/100/70/75)  Therapeutic right S1 TFESI x1 (11/04/2020) (100/100/20/<50)  Diagnostic bilateral superficial trochanteric bursa injec. x1 (04/30/2018) (DKFUA)  Diagnostic right IA hip injection x1 (02/13/2019) (DKFUA)  Diagnostic/therapeutic right IA steroid knee inj. x4 (01/10/2023) (100/100/100/80)  Therapeutic right (Zilretta) knee injection x1 (10/06/2021) (DKFUA)  Therapeutic right (Monovisc) knee injection x1 (06/07/2021) (100/100/100/75)    Therapeutic  Palliative (PRN) options:   Therapeutic/palliative lumbar facet RFA  Therapeutic/palliative lumbar facet MBB  Therapeutic left L2 TFESI (this level is responsible for her low back pain and left lower extremity pain.)   Pharmacotherapy  Nonopioids transfer 12/21/2020: Gabapentin        Recent Visits Date Type Provider Dept  06/27/23 Office Visit [name], MD [facility]  06/19/23 Office Visit [name], MD [facility]  06/05/23 Procedure visit [name], MD [facility]  05/22/23 Office  Visit [name], MD  [facility]  04/26/23 Procedure visit [name], MD [facility]  04/25/23 Office Visit [name], MD [facility]  04/12/23 Procedure visit [name], MD [facility]  Showing recent visits within past 90 days and meeting all other requirements Today's Visits Date Type Provider Dept  07/03/23 Procedure visit [name], MD [facility]  Showing today's visits and meeting all other requirements Future Appointments Date Type Provider Dept  07/25/23 Appointment [name], MD [facility]  Showing future appointments within next 90 days and meeting all other requirements  Disposition: Discharge home  Discharge (Date  Time): 07/03/2023;   hrs.   Primary Care Physician: Dale Alamo, MD Location: [facility] Note by: Oswaldo Done, MD (TTS technology used. I apologize for any typographical errors that were not detected and corrected.) Date: 07/03/2023; Time: 11:05 AM  Disclaimer:  Medicine is not an Visual merchandiser. The only guarantee in medicine is that nothing is guaranteed. It is important to note that the decision to proceed with this intervention was based on the information collected from the patient. The Data and conclusions were drawn from the patient's questionnaire, the interview, and the physical examination. Because the information was provided in large part by the patient, it cannot be guaranteed that it has not been purposely or unconsciously manipulated. Every effort has been made to obtain as much relevant data as possible for this evaluation. It is important to note that the conclusions that lead to this procedure are derived in large part from the available data. Always take into account that the treatment will also be dependent on availability of resources and existing treatment guidelines, considered by other Pain  Management Practitioners as being common knowledge and practice, at the time of the intervention. For Medico-Legal purposes, it is also important to point out that variation in procedural techniques and pharmacological choices are the acceptable norm. The indications, contraindications, technique, and results of the above procedure should only be interpreted and judged by a Board-Certified Interventional Pain Specialist with extensive familiarity and expertise in the same exact procedure and technique.

## 2023-07-04 ENCOUNTER — Other Ambulatory Visit (HOSPITAL_BASED_OUTPATIENT_CLINIC_OR_DEPARTMENT_OTHER): Payer: Medicare HMO | Admitting: Pain Medicine

## 2023-07-04 ENCOUNTER — Telehealth: Payer: Self-pay

## 2023-07-04 DIAGNOSIS — R937 Abnormal findings on diagnostic imaging of other parts of musculoskeletal system: Secondary | ICD-10-CM

## 2023-07-04 DIAGNOSIS — M5416 Radiculopathy, lumbar region: Secondary | ICD-10-CM

## 2023-07-04 DIAGNOSIS — M5136 Other intervertebral disc degeneration, lumbar region: Secondary | ICD-10-CM

## 2023-07-04 DIAGNOSIS — G894 Chronic pain syndrome: Secondary | ICD-10-CM

## 2023-07-04 NOTE — Progress Notes (Signed)
On 3 different occasions the patient's insurance carrier has denied that transforaminal epidural steroid treatments that we believed to be medically necessary.  They clearly do not care for this 88 year old with Parkinson's disease, prior history of breast cancer, and chronic pain associated to her degenerative disc disease.  She has an MRI with positive findings as seen below.  (08/23/2022) LUMBAR MRI FINDINGS: Alignment: Dextroscoliosis. Vertebrae: Degenerative endplate irregularity. There is degenerative endplate marrow edema at T12-L1 greater than L2-3.   DISC LEVELS:  Mild disc bulges at included lower thoracic levels. L1-2:  Disc bulge. L2-3: Disc bulge with endplate osteophytic ridging eccentric to the left. (L>R) facet arthropathy with ligamentum flavum infolding. Moderate left foraminal stenosis. L3-4: Disc bulge with endplate osteophytic ridging eccentric to the left. Facet arthropathy with ligamentum flavum infolding. Slight effacement of right subarticular recess. Mild left foraminal stenosis. L4-5: Disc bulge with endplate osteophytic ridging eccentric to the right. (R>L) facet arthropathy with ligamentum flavum infolding. Mild foraminal stenosis. L5-S1: Disc bulge with endplate osteophytic ridging eccentric to the right. (R>L) facet arthropathy. Moderate right foraminal stenosis.   IMPRESSION: Multilevel degenerative changes as detailed above are similar to the 2021 examination. There is no high-grade canal stenosis. Right foraminal narrowing is greatest at L5-S1 and left foraminal narrowing is greatest at L2-3 with potential for exiting nerve root compression at these levels.  She also has evidence of radicular lower extremity pain and radicular low back pain, not controlled with prescribed opioids.  She continues to suffer with this pain and her functional status and general physical condition continues to deteriorate as a consequence of her insurance reluctance to treat her  condition.  All of the above has been properly documented throughout her electronic medical record but they continue to put excuses citing regulations that are not founded on sound medical data.

## 2023-07-04 NOTE — Telephone Encounter (Signed)
Post procedure follow up.  Patient states she is feeling some better.

## 2023-07-09 ENCOUNTER — Ambulatory Visit
Admission: RE | Admit: 2023-07-09 | Discharge: 2023-07-09 | Disposition: A | Discharge status: Home / Self Care | Payer: Medicare HMO | Source: Ambulatory Visit | Attending: Oncology | Admitting: Oncology

## 2023-07-09 DIAGNOSIS — I471 Supraventricular tachycardia, unspecified: Secondary | ICD-10-CM | POA: Diagnosis not present

## 2023-07-09 DIAGNOSIS — R600 Localized edema: Secondary | ICD-10-CM | POA: Diagnosis not present

## 2023-07-09 DIAGNOSIS — R300 Dysuria: Secondary | ICD-10-CM | POA: Diagnosis not present

## 2023-07-09 DIAGNOSIS — N3000 Acute cystitis without hematuria: Secondary | ICD-10-CM | POA: Diagnosis not present

## 2023-07-09 DIAGNOSIS — M810 Age-related osteoporosis without current pathological fracture: Secondary | ICD-10-CM | POA: Diagnosis not present

## 2023-07-10 ENCOUNTER — Ambulatory Visit: Payer: Medicare HMO

## 2023-07-10 DIAGNOSIS — I471 Supraventricular tachycardia, unspecified: Secondary | ICD-10-CM | POA: Diagnosis not present

## 2023-07-10 LAB — ECHOCARDIOGRAM COMPLETE
AR max vel: 1.44 cm2
AV Area VTI: 1.71 cm2
AV Area mean vel: 1.45 cm2
AV Mean grad: 4.5 mmHg
AV Peak grad: 12 mmHg
Ao pk vel: 1.73 m/s
Area-P 1/2: 3.99 cm2

## 2023-07-10 MED ORDER — PERFLUTREN LIPID MICROSPHERE
1.0000 mL | INTRAVENOUS | Status: AC | PRN
Start: 2023-07-10 — End: 2023-07-10
  Administered 2023-07-10: 2 mL via INTRAVENOUS

## 2023-07-16 ENCOUNTER — Encounter: Payer: Self-pay | Admitting: Internal Medicine

## 2023-07-16 ENCOUNTER — Ambulatory Visit: Payer: Medicare HMO | Attending: Cardiology | Admitting: Cardiology

## 2023-07-16 ENCOUNTER — Encounter: Payer: Self-pay | Admitting: Cardiology

## 2023-07-16 VITALS — BP 120/66 | HR 52 | Ht 64.0 in | Wt 176.6 lb

## 2023-07-16 DIAGNOSIS — E7800 Pure hypercholesterolemia, unspecified: Secondary | ICD-10-CM

## 2023-07-16 DIAGNOSIS — I10 Essential (primary) hypertension: Secondary | ICD-10-CM | POA: Diagnosis not present

## 2023-07-16 DIAGNOSIS — R609 Edema, unspecified: Secondary | ICD-10-CM | POA: Diagnosis not present

## 2023-07-16 DIAGNOSIS — I471 Supraventricular tachycardia, unspecified: Secondary | ICD-10-CM

## 2023-07-16 NOTE — Progress Notes (Signed)
PROVIDER NOTE: Information contained herein reflects review and annotations entered in association with encounter. Interpretation of such information and data should be left to medically-trained personnel. Information provided to patient can be located elsewhere in the medical record under "Patient Instructions". Document created using STT-dictation technology, any transcriptional errors that may result from process are unintentional.    Patient: Wanda Martinez  Service Category: E/M  Provider: Oswaldo Done, MD  DOB: 1935-07-21  DOS: 07/17/2023  Referring Provider: [facility], MD  MRN: [id number]  Specialty: Interventional Pain Management  PCP: Dale [facility], MD  Type: Established Patient  Setting: Ambulatory outpatient    Location: Office  Delivery: Face-to-face     HPI  [name], a 88 y.o. year old female, is here today because of her Chronic right-sided low back pain with right-sided sciatica [M54.41, G89.29]. [name] primary complain today is No chief complaint on file.  Pertinent problems: [name] has Headache; Parkinson's disease; Chronic low back pain (1ry area of Pain) (Bilateral) (R>L) w/o sciatica; Lumbar spondylosis; Chronic hip pain (Bilateral); Chronic neck pain; Cervical spondylosis; Chronic cervical radicular pain (Right); Diffuse myofascial pain syndrome; Neurogenic pain; Chronic upper back pain (Right); Myofascial pain syndrome (Right) (cervicothoracic); Lumbar facet syndrome (Bilateral) (R>L); History of breast cancer; Cervical facet hypertrophy; Cervical facet syndrome (HCC); Chronic shoulder pain (Right); Chronic pain syndrome; Chronic sacroiliac joint pain (Left); Chronic sacroiliac joint pain (Right); 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, L4-5, L5-S1) (Right); Lumbar spinal stenosis (with neurogenic claudication) (L3-4); Chronic lower extremity pain (Right); Chronic lumbar radicular pain (Right); Trochanteric bursitis of hip (Bilateral);  Spondylosis without myelopathy or radiculopathy, lumbosacral region; Trigger point with back pain (Right); DDD (degenerative disc disease), lumbosacral; Chronic upper extremity pain (Right); Chronic thoracic back pain (Bilateral) (L>R); Trigger point of thoracic region (Bilateral) (L>R); Other specified dorsopathies, sacral and sacrococcygeal region; Sacroiliac joint dysfunction (Right); Osteoarthritis of sacroiliac joint (Right); Somatic dysfunction of sacroiliac joint (Right); Chronic musculoskeletal pain; Facial pain; Chronic hip pain (Right); Osteoarthritis of hip (Right); Left ear pain; 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HCC); Abnormal MRI, lumbar spine (08/23/2022); Osteoarthritis involving multiple joints; Other spondylosis, sacral and sacrococcygeal region; Lumbar facet hypertrophy (Multilevel) (Bilateral); Chronic knee pain (Right); Osteoarthritis of knee (Right); Trigger point of shoulder region (Right); Osteoarthritis of AC (acromioclavicular) joint (Right); Osteoarthritis of shoulder (Right); Unspecified injury of muscle(s) and tendon(s) of the rotator cuff of shoulder, sequela (Right); Cervicalgia; DDD (degenerative disc disease), lumbar; Lumbar radicular pain (Bilateral); Lumbar foraminal stenosis (Right: L3-4, L4-5, L5-S1) (Left: L2-3); Abnormal MRI, hip (Right) (05/04/2022); Abnormal MRI, knee (01/04/2022); Tricompartment osteoarthritis of knee (Right); Chronic low back pain (Right) w/o sciatica; Hoffa's fat pad disease (HCC) (Right); Hoffa's pad syndrome (HCC) (Right); Chronic low back pain (Right) w/ sciatica (Right); Lower extremity weakness (Right); Chronic radicular pain of lower back; Chronic sciatica (Right); Lumbosacral radiculopathy at L5 (Right); and Low back pain of over 3 months duration on their pertinent problem list. Pain Assessment: Severity of   is reported as a  /10. Location:    / . Onset:  . Quality:  . Timing:  . Modifying factor(s):  Marland Kitchen Vitals:  vitals were not taken for this  visit.  BMI: Estimated body mass index is 29.7 kg/m as calculated from the following:   Height as of 07/03/23: 5\' 4"  (1.626 m).   Weight as of 07/03/23: 173 lb (78.5 kg). Last encounter: 06/27/2023. Last procedure: 07/03/2023.  Reason for encounter: post-procedure evaluation and assessment. ***  Post-procedure evaluation   Procedure: Lumbar trans-foraminal epidural  steroid injection (L-TFESI)  R5L2   Laterality: Bilateral (-50) (Right L5) and (Left L2)  Level: L5 & L2 nerve root(s) Imaging: Fluoroscopy-guided         Anesthesia: Local anesthesia (1-2% Lidocaine) Anxiolysis: IV Versed         Sedation: No Sedation                       DOS: 07/03/2023  Performed by: Oswaldo Done, MD  Purpose: Diagnostic/Therapeutic Indications: Lumbar radicular pain severe enough to impact quality of life or function. 1. Chronic low back pain (Right) w/ sciatica (Right)   2. Chronic lower extremity pain (Right)   3. Chronic lumbar radicular pain (Right)   4. Chronic sciatica (Right)   5. DDD (degenerative disc disease), lumbosacral   6. Lower extremity weakness (Right)   7. Lumbar foraminal stenosis (Right: L3-4, L4-5, L5-S1) (Left: L2-3)   8. Lumbar radicular pain (Bilateral)   9. Lumbar spinal stenosis (with neurogenic claudication) (L3-4)   10. 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, L4-5, L5-S1) (Right)   11. Lumbosacral radiculopathy at L5 (Right)    NAS-11 Pain score:   Pre-procedure: 8 /10   Post-procedure: 8 /10      Effectiveness:  Initial hour after procedure:   ***. Subsequent 4-6 hours post-procedure:   ***. Analgesia past initial 6 hours:   ***. Ongoing improvement:  Analgesic:  *** Function:    ***    ROM:    ***     Pharmacotherapy Assessment  Analgesic: Hydrocodone/APAP 5/325, 1 tab PO q 8 hrs (15 mg/day of hydrocodone) MME/day: 15 mg/day.   Monitoring: [place] PMP: PDMP reviewed during this encounter.       Pharmacotherapy: No side-effects or adverse reactions  reported. Compliance: No problems identified. Effectiveness: Clinically acceptable.  No notes on file  No results found for: "CBDTHCR" No results found for: "D8THCCBX" No results found for: "D9THCCBX"  UDS:  Summary  Date Value Ref Range Status  06/27/2023 Note  Final    Comment:    ==================================================================== [employer] Select 13 (MW) ==================================================================== Test                             Result       Flag       Units  Drug Present not Declared for Prescription Verification   Oxycodone                      1670         UNEXPECTED ng/mg creat   Noroxycodone                   3226         UNEXPECTED ng/mg creat    Sources of oxycodone include scheduled prescription medications.    Noroxycodone is an expected metabolite of oxycodone.  Drug Absent but Declared for Prescription Verification   Hydrocodone                    Not Detected UNEXPECTED ng/mg creat ==================================================================== Test                      Result    Flag   Units      Ref Range   Creatinine              179  mg/dL      >=10 ==================================================================== Declared Medications:  The flagging and interpretation on this report are based on the  following declared medications.  Unexpected results may arise from  inaccuracies in the declared medications.   **Note: The testing scope of this panel includes these medications:   Hydrocodone (Norco)   **Note: The testing scope of this panel does not include the  following reported medications:   Acetaminophen (Norco)  Amlodipine  Aspirin  Calcium  Chondroitin  Ciprofloxacin (Cipro)  Citalopram (Celexa)  Donepezil (Aricept)  Erythromycin  Gabapentin (Neurontin)  Glucosamine  Hydrocortisone  Letrozole (Femara)  Losartan  Lubiprostone (Amitiza)  Magnesium (Mag-Ox)  Melatonin   Metoprolol  Mirabegron  Multivitamin  Naloxone (Narcan)  Nitrofurantoin (Macrobid)  Omega-3 Fatty Acids  Pantoprazole (Protonix)  Rosuvastatin ==================================================================== For clinical consultation, please call [phone]. ====================================================================       ROS  Constitutional: Denies any fever or chills Gastrointestinal: No reported hemesis, hematochezia, vomiting, or acute GI distress Musculoskeletal: Denies any acute onset joint swelling, redness, loss of ROM, or weakness Neurological: No reported episodes of acute onset apraxia, aphasia, dysarthria, agnosia, amnesia, paralysis, loss of coordination, or loss of consciousness  Medication Review  Glucosamine-Chondroitin, HYDROcodone-acetaminophen, Omega-3, amLODipine, aspirin EC, calcium carbonate, ciprofloxacin, citalopram, cyclobenzaprine, donepezil, erythromycin, gabapentin, hydrocortisone, letrozole, losartan, lubiprostone, magnesium oxide, melatonin, metoprolol succinate, mirabegron ER, multivitamin, naloxone, nitrofurantoin (macrocrystal-monohydrate), pantoprazole, and rosuvastatin  History Review  Allergy: [name] is allergic to sulfa antibiotics and vesicare [solifenacin]. Drug: [name]  reports no history of drug use. Alcohol:  reports no history of alcohol use. Tobacco:  reports that she has never smoked. She has never been exposed to tobacco smoke. She has never used smokeless tobacco. Social: Ms. Wetzel  reports that she has never smoked. She has never been exposed to tobacco smoke. She has never used smokeless tobacco. She reports that she does not drink alcohol and does not use drugs. Medical:  has a past medical history of Acute postoperative pain (10/25/2017), Anemia, Arm pain (07/26/2015), Arthritis, Arthritis, degenerative (03/26/2014), Back pain (11/01/2013), Bladder infection (06/2018), Breast cancer (HCC), Breast cancer (HCC),  CHEST PAIN (04/29/2010), Chronic cystitis, Cystocele (02/22/2013), Cystocele, midline (08/19/2013), Degeneration of intervertebral disc of lumbosacral region (03/26/2014), DYSPNEA (04/29/2010), Enthesopathy of hip (03/26/2014), GERD (gastroesophageal reflux disease), Hiatal hernia, HTN (hypertension), Hypokalemia (06/2018), Hyponatremia (06/2018), LBP (low back pain) (03/26/2014), Neck pain (11/01/2013), Parkinson disease, Pneumonia (06/2018), Sinusitis (02/07/2015), Skin lesions (07/12/2014), and Urinary incontinence. Surgical: [name]  has a past surgical history that includes Tonsillectomy and adenoidectomy (1979); Vesicovaginal fistula closure w/ TAH (1983); Breast surgery (1986); Breast enhancement surgery (1987); Breast implant removal; Breast implant removal (Right, 08/29/2012); Mastectomy (08/2012); Abdominal hysterectomy; Colonoscopy with propofol (N/A, 09/13/2016); Breast biopsy (2013); and Blepharoplasty. Family: family history includes Colon polyps in her father; Diabetes in her father; Parkinson's disease in her mother; Stroke in her father and mother.  Laboratory Chemistry Profile   Renal Lab Results  Component Value Date   BUN 35 (H) 05/15/2023   CREATININE 1.44 (H) 05/15/2023   BCR 20 06/11/2020   GFR 48.46 (L) 04/09/2023   GFRAA >60 07/27/2020   GFRNONAA 36 (L) 05/15/2023    Hepatic Lab Results  Component Value Date   AST 26 04/09/2023   ALT 18 04/09/2023   ALBUMIN 4.1 04/09/2023   ALKPHOS 30 (L) 04/09/2023    Electrolytes Lab Results  Component Value Date   NA 133 (L) 05/15/2023   K 3.5 05/15/2023   CL 95 (L) 05/15/2023  CALCIUM 9.3 05/15/2023   MG 2.2 07/17/2018    Bone Lab Results  Component Value Date   VD25OH 66.95 09/26/2022   25OHVITD1 42 07/17/2018   25OHVITD2 <1.0 07/17/2018   25OHVITD3 42 07/17/2018    Inflammation (CRP: Acute Phase) (ESR: Chronic Phase) Lab Results  Component Value Date   CRP 3 07/17/2018   ESRSEDRATE 27 07/17/2018         Note: Above  Lab results reviewed.  Recent Imaging Review  ECHOCARDIOGRAM COMPLETE    ECHOCARDIOGRAM REPORT       Patient Name:   Daionna VEYONCE MCCOUN Date of Exam: 07/10/2023 Medical Rec #:  [id number]           Height:       64.0 in Accession #:    [id number]          Weight:       173.0 lb Date of Birth:  07-Feb-1935          BSA:          1.839 m Patient Age:    87 years            BP:           112/62 mmHg Patient Gender: F                   HR:           50 bpm. Exam Location:    Procedure: 2D Echo, Cardiac Doppler, Color Doppler and Intracardiac            Opacification Agent  Indications:    I47.1 SVT; R53.83 Fatigue; R60.0 Lower extremity edema; R06.02                 SOB   History:        Patient has no prior history of Echocardiogram examinations.                 Arrythmias:SVT, Signs/Symptoms:Fatigue, Edema and Shortness of                 Breath; Risk Factors:Non-Smoker.   Sonographer:    Quentin Ore RDMS, RVT, RDCS Referring Phys: [id number] BRIAN AGBOR-ETANG  IMPRESSIONS   1. Left ventricular ejection fraction, by estimation, is 55 to 60%. The left ventricle has normal function. The left ventricle has no regional wall motion abnormalities. There is mild left ventricular hypertrophy. Left ventricular diastolic parameters  are indeterminate.  2. Right ventricular systolic function is normal. The right ventricular size is normal.  3. There is no evidence of cardiac tamponade.  4. The mitral valve is normal in structure. No evidence of mitral valve regurgitation. No evidence of mitral stenosis.  5. The aortic valve has an indeterminant Martinez of cusps. Aortic valve regurgitation is not visualized. No aortic stenosis is present.  6. The inferior vena cava is normal in size with greater than 50% respiratory variability, suggesting right atrial pressure of 3 mmHg.  FINDINGS  Left Ventricle: Left ventricular ejection fraction, by estimation, is 55 to 60%. The left ventricle has  normal function. The left ventricle has no regional wall motion abnormalities. Definity contrast agent was given IV to delineate the left ventricular  endocardial borders. The left ventricular internal cavity size was normal in size. There is mild left ventricular hypertrophy. Left ventricular diastolic parameters are indeterminate.  Right Ventricle: The right ventricular size is normal. No increase in right ventricular wall thickness. Right ventricular systolic function is normal.  Left Atrium: Left atrial size was normal in size.  Right Atrium: Right atrial size was normal in size.  Pericardium: Trivial pericardial effusion is present. The pericardial effusion is circumferential. There is no evidence of cardiac tamponade.  Mitral Valve: The mitral valve is normal in structure. No evidence of mitral valve regurgitation. No evidence of mitral valve stenosis.  Tricuspid Valve: The tricuspid valve is normal in structure. Tricuspid valve regurgitation is not demonstrated. No evidence of tricuspid stenosis.  Aortic Valve: The aortic valve has an indeterminant Martinez of cusps. Aortic valve regurgitation is not visualized. No aortic stenosis is present. Aortic valve mean gradient measures 4.5 mmHg. Aortic valve peak gradient measures 12.0 mmHg. Aortic valve  area, by VTI measures 1.71 cm.  Pulmonic Valve: The pulmonic valve was normal in structure. Pulmonic valve regurgitation is not visualized. No evidence of pulmonic stenosis.  Aorta: The aortic root is normal in size and structure.  Venous: The inferior vena cava is normal in size with greater than 50% respiratory variability, suggesting right atrial pressure of 3 mmHg.  IAS/Shunts: No atrial level shunt detected by color flow Doppler.    LEFT VENTRICLE PLAX 2D LVOT diam:     1.80 cm LV SV:         61 LV SV Index:   33 LVOT Area:     2.54 cm    RIGHT VENTRICLE             IVC RV Basal diam:  3.70 cm     IVC diam: 2.00 cm RV Mid  diam:    3.90 cm RV S prime:     13.40 cm/s TAPSE (M-mode): 2.4 cm  LEFT ATRIUM           Index        RIGHT ATRIUM           Index LA diam:      3.90 cm 2.12 cm/m   RA Area:     12.40 cm LA Vol (A4C): 32.7 ml 17.78 ml/m  RA Volume:   28.60 ml  15.55 ml/m  AORTIC VALVE                     PULMONIC VALVE AV Area (Vmax):    1.44 cm      PV Vmax:       0.68 m/s AV Area (Vmean):   1.45 cm      PV Peak grad:  1.9 mmHg AV Area (VTI):     1.71 cm AV Vmax:           173.00 cm/s AV Vmean:          113.000 cm/s AV VTI:            0.358 m AV Peak Grad:      12.0 mmHg AV Mean Grad:      4.5 mmHg LVOT Vmax:         97.85 cm/s LVOT Vmean:        64.200 cm/s LVOT VTI:          0.240 m LVOT/AV VTI ratio: 0.67   AORTA Ao Root diam: 3.00 cm Ao Asc diam:  3.40 cm Ao Arch diam: 3.1 cm  MITRAL VALVE MV Area (PHT): 3.99 cm     SHUNTS MV Decel Time: 190 msec     Systemic VTI:  0.24 m MV E velocity: 119.00 cm/s  Systemic Diam: 1.80 cm MV A velocity: 72.30 cm/s MV E/A ratio:  1.65  Julien Nordmann MD Electronically signed by Julien Nordmann MD Signature Date/Time: 07/10/2023/5:03:33 PM      Final   Note: Reviewed        Physical Exam  General appearance: Well nourished, well developed, and well hydrated. In no apparent acute distress Mental status: Alert, oriented x 3 (person, place, & time)       Respiratory: No evidence of acute respiratory distress Eyes: PERLA Vitals: LMP 12/18/1981  BMI: Estimated body mass index is 29.7 kg/m as calculated from the following:   Height as of 07/03/23: 5\' 4"  (1.626 m).   Weight as of 07/03/23: 173 lb (78.5 kg). Ideal: Ideal body weight: 54.7 kg (120 lb 9.5 oz) Adjusted ideal body weight: 64.2 kg (141 lb 8.9 oz)  Assessment   Diagnosis Status  1. Chronic low back pain (Right) w/ sciatica (Right)   2. Chronic lower extremity pain (Right)   3. Lumbar radicular pain (Bilateral)   4. Chronic sciatica (Right)   5. Postop check     Controlled Controlled Controlled   Updated Problems: No problems updated.  Plan of Care  Problem-specific:  No problem-specific Assessment & Plan notes found for this encounter.  Ms. Zoelle Wanda Martinez has a current medication list which includes the following long-term medication(s): amlodipine, calcium carbonate, citalopram, cyclobenzaprine, donepezil, gabapentin, hydrocodone-acetaminophen, losartan, magnesium oxide, metoprolol succinate, mirabegron er, pantoprazole, and rosuvastatin.  Pharmacotherapy (Medications Ordered): No orders of the defined types were placed in this encounter.  Orders:  No orders of the defined types were placed in this encounter.  Follow-up plan:   No follow-ups on file.      Interventional Therapies  Risk Factors  Considerations:   Parkinson's disease  Hx right breast cancer  OIC  SOB  CKD Stage 3  GERD  depression  osteopenia/osteoporosis     Planned  Pending:   Therapeutic right L5 #5 and left L2 TFESI #2  Therapeutic/palliative right IA knee Zilretta knee injection #2    Under consideration:   Therapeutic right SI joint injection #4  Diagnostic right genicular NB #1  Diagnostic bilateral cervical facet MBB  Therapeutic right L3 and L4 TFESI #1    Completed:   Therapeutic right superior/lateral trapezius muscle TPI x1 (04/19/2021)  Therapeutic right thoracic back TPI x2 (10/17/2018) (DKFUA)  Therapeutic left thoracic back TPI x1 (10/17/2018) (DKFUA)  Therapeutic right quadratus lumborum and erector spinae muscle TPI x1 (02/12/2018)  Therapeutic bilateral lumbar facet MBB (L2-S1) (FCT: L3-4,L4-5,L5-S1) x5 (08/09/2021) (100/100/50)  Therapeutic bilateral lumbar facet MBB (L4-S1) (FCT: L5-S1) x1 (08/29/2022) (100/100/100 x3 days/R:0L:100)  Therapeutic right lumbar facet MBB (L2-S1) x4 (12/21/2020) (DKFUA)  Therapeutic right lumbar facet RFA (L2-S1) x3 (09/28/2022) (100/100/75/75)  Therapeutic right lumbar facet RFA (L3-S1) x1  (03/01/2023) (100/100/70/70)  Therapeutic left lumbar facet RFA x2 (11/30/2022) (100/60/90/>75)  Diagnostic right SI joint Blk x3 (12/21/2020)  (DKFUA)  Diagnostic left SI joint Blk x2 (04/12/2023) (100/90/20/<25)  Therapeutic right SI joint RFA x4 (03/01/2023) (100/100/70/70)  Therapeutic midline L2-3 LESI x1 (10/04/2017) (100/100/85/>50)  Therapeutic right L3-4 LESI x2 (11/04/2020) (100/100/20/<50)  Therapeutic right L4-5 LESI x2 (03/30/2022) (100/100/75/75)  Therapeutic right L5-S1 LESI x2 (12/04/2019) (100/100/50/75)  Diagnostic/therapeutic right L3 and L4 TFESI x1 (07/27/2022) (100/100/75/50)  Therapeutic right L5 TFESI x4 (02/14/2022) (100/100/70/75)  Therapeutic right S1 TFESI x1 (11/04/2020) (100/100/20/<50)  Diagnostic bilateral superficial trochanteric bursa injec. x1 (04/30/2018) (DKFUA)  Diagnostic right IA hip injection x1 (02/13/2019) (DKFUA)  Diagnostic/therapeutic right IA steroid knee inj. x4 (01/10/2023) (100/100/100/80)  Therapeutic right (Zilretta)  knee injection x1 (10/06/2021) (DKFUA)  Therapeutic right (Monovisc) knee injection x1 (06/07/2021) (100/100/100/75)    Therapeutic  Palliative (PRN) options:   Therapeutic/palliative lumbar facet RFA  Therapeutic/palliative lumbar facet MBB  Therapeutic left L2 TFESI (this level is responsible for her low back pain and left lower extremity pain.)   Pharmacotherapy  Nonopioids transfer 12/21/2020: Gabapentin         Recent Visits Date Type Provider Dept  07/03/23 Procedure visit [name], MD [facility]  06/27/23 Office Visit [name], MD [facility]  06/19/23 Office Visit [name], MD [facility]  06/05/23 Procedure visit [name], MD [facility]  05/22/23 Office Visit [name], MD [facility]  04/26/23 Procedure visit [name], MD [facility]  04/25/23 Office Visit [name], MD [facility]  Showing recent visits within past 90 days and meeting all other requirements Future Appointments Date Type Provider Dept  07/17/23 Appointment [name], MD [facility]  07/25/23 Appointment [name], MD [facility]  Showing future appointments within next 90 days and meeting all other requirements  I discussed the assessment and treatment plan with the patient. The patient was provided an opportunity to ask questions and all were answered. The patient agreed with the plan and demonstrated an understanding of the instructions.  Patient advised to call back or seek an in-person evaluation if the symptoms or condition worsens.  Duration of encounter: *** minutes.  Total time on encounter, as per AMA guidelines included both the face-to-face and non-face-to-face time personally spent by the physician and/or other qualified health care professional(s) on the day of the encounter (includes time in activities that require the physician or other qualified health care professional and does not include time in activities normally performed by clinical staff). Physician's time may include the following activities when performed: Preparing to see the patient (e.g., pre-charting review of records, searching for previously ordered imaging, lab work, and nerve conduction tests) Review of prior analgesic pharmacotherapies. Reviewing PMP Interpreting ordered tests (e.g., lab work, imaging, nerve conduction tests) Performing post-procedure evaluations, including interpretation of diagnostic procedures Obtaining and/or reviewing separately obtained history Performing a medically appropriate examination and/or evaluation Counseling and educating the patient/family/caregiver Ordering medications, tests, or procedures Referring and communicating with other health care professionals (when not separately reported) Documenting clinical information in the electronic  or other health record Independently interpreting results (not separately reported) and communicating results to the patient/ family/caregiver Care coordination (not separately reported)  Note by: Oswaldo Done, MD Date: 07/17/2023; Time: 8:43 AM

## 2023-07-16 NOTE — Progress Notes (Signed)
Cardiology Office Note:    Date:  07/16/2023   ID:  Wanda, Martinez [birth year], MRN [id number]  PCP:  Dale Cordova, MD   [facility] Providers Cardiologist:  Debbe Odea, MD     Referring MD: [place], MD   Chief Complaint  Patient presents with   Follow-up    Patient is concerned with LE swelling, L>R for past few weeks.    History of Present Illness:    Wanda Martinez is a 88 y.o. female with a hx of hypertension, hyperlipidemia, paroxysmal SVT, Parkinson's disease/tremors, CKD who presents for follow-up.  Previously seen to establish care, prior cardiac monitor showed paroxysmal SVT.  Symptoms of palpitations adequately controlled on Toprol-XL 50 mg daily.  Echocardiogram was obtained to evaluate any significant structural abnormalities.  Patient endorses leg edema which gets worse as the day progresses.  Compliant with medications as prescribed.  Tries to be as active as possible.  Prior notes/testing Echo 7/24 EF 55 to 60%. Echocardiogram 08/2020 EF 60% Pharmacologic MPI 11/2019 no ischemia.  Past Medical History:  Diagnosis Date   Acute postoperative pain 10/25/2017   Anemia    Arm pain 07/26/2015   Arthritis    Arthritis, degenerative 03/26/2014   Back pain 11/01/2013   Bladder infection 06/2018   Breast cancer (HCC)    Masectomy - left - 1986    Breast cancer [facility])    Mastectomy-right -2014   CHEST PAIN 04/29/2010   Qualifier: Diagnosis of  By: Laural Benes RN, Erika     Chronic cystitis    Cystocele 02/22/2013   Cystocele, midline 08/19/2013   Degeneration of intervertebral disc of lumbosacral region 03/26/2014   DYSPNEA 04/29/2010   Qualifier: Diagnosis of  By: Laural Benes RN, Erika     Enthesopathy of hip 03/26/2014   GERD (gastroesophageal reflux disease)    Hiatal hernia    HTN (hypertension)    Hypokalemia 06/2018   Hyponatremia 06/2018   LBP (low back pain) 03/26/2014   Neck pain 11/01/2013   Parkinson disease    Pneumonia  06/2018   Sinusitis 02/07/2015   Skin lesions 07/12/2014   Urinary incontinence    mixed     Past Surgical History:  Procedure Laterality Date   ABDOMINAL HYSTERECTOMY     BLEPHAROPLASTY     BREAST BIOPSY  2013   BREAST ENHANCEMENT SURGERY  1987   BREAST IMPLANT REMOVAL     BREAST IMPLANT REMOVAL Right 08/29/2012   BREAST SURGERY  1986   s/p left mastectomy   COLONOSCOPY WITH PROPOFOL N/A 09/13/2016   Procedure: COLONOSCOPY WITH PROPOFOL;  Surgeon: Scot Jun, MD;  Location: [facility] ENDOSCOPY;  Service: Endoscopy;  Laterality: N/A;   MASTECTOMY  08/2012   right   TONSILLECTOMY AND ADENOIDECTOMY  1979   VESICOVAGINAL FISTULA CLOSURE W/ TAH  1983    Current Medications: Current Meds  Medication Sig   amLODipine (NORVASC) 2.5 MG tablet Take 1 tablet (2.5 mg total) by mouth daily.   aspirin 81 MG EC tablet Take 81 mg by mouth daily as needed.   calcium carbonate (OSCAL) 1500 (600 Ca) MG TABS tablet Take 1 tablet by mouth daily.   citalopram (CELEXA) 20 MG tablet TAKE 1 TABLET BY MOUTH ONCE DAILY.   cyclobenzaprine (FLEXERIL) 10 MG tablet TAKE ONE TABLET BY MOUTH AT BEDTIME.   donepezil (ARICEPT) 10 MG tablet TAKE ONE TABLET BY MOUTH AT BEDTIME   gabapentin (NEURONTIN) 300 MG capsule Take 1 capsule (300 mg total) by  mouth at bedtime.   Glucosamine-Chondroitin 500-400 MG CAPS Take 2 capsules by mouth daily.   HYDROcodone-acetaminophen (NORCO/VICODIN) 5-325 MG tablet Take 1 tablet by mouth 3 (three) times daily. Must last 30 days.   hydrocortisone (ANUSOL-HC) 25 MG suppository Place 1 suppository (25 mg total) rectally 2 (two) times daily.   letrozole (FEMARA) 2.5 MG tablet Take 2.5 mg by mouth daily.   losartan (COZAAR) 50 MG tablet TAKE (2) TABLETS BY MOUTH ONCE DAILY.   lubiprostone (AMITIZA) 24 MCG capsule TAKE 1 CAPSULE BY MOUTH ONCE DAILY WITH BREAKFAST.   magnesium oxide (MAG-OX) 400 (240 Mg) MG tablet TAKE ONE TABLET BY MOUTH EVERY DAY   melatonin 1 MG TABS tablet Take 3  mg by mouth at bedtime.   metoprolol succinate (TOPROL-XL) 25 MG 24 hr tablet Take 1 tablet (25 mg total) by mouth 2 (two) times daily.   mirabegron ER (MYRBETRIQ) 50 MG TB24 tablet Take 1 tablet (50 mg total) by mouth daily.   Multiple Vitamin (MULTIVITAMIN) tablet Take 1 tablet by mouth daily.   naloxone (NARCAN) nasal spray 4 mg/0.1 mL Place 1 spray into the nose as needed for up to 365 doses (for opioid-induced respiratory depresssion). In case of emergency (overdose), spray once into each nostril. If no response within 3 minutes, repeat application and call 911.   Omega-3 1000 MG CAPS Take by mouth.   pantoprazole (PROTONIX) 40 MG tablet Take 1 tablet (40 mg total) by mouth daily.   rosuvastatin (CRESTOR) 20 MG tablet TAKE ONE TABLET BY MOUTH AT BEDTIME     Allergies:   Sulfa antibiotics and Vesicare [solifenacin]   Social History   Socioeconomic History   Marital status: Married    Spouse name: Not on file   Number of children: 3   Years of education: Not on file   Highest education level: Not on file  Occupational History   Not on file  Tobacco Use   Smoking status: Never    Passive exposure: Never   Smokeless tobacco: Never   Tobacco comments:    tobacco use - no  Vaping Use   Vaping status: Never Used  Substance and Sexual Activity   Alcohol use: No    Alcohol/week: 0.0 standard drinks of alcohol   Drug use: No   Sexual activity: Never  Other Topics Concern   Not on file  Social History Narrative   Not on file   Social Determinants of Health   Financial Resource Strain: Low Risk  (02/26/2023)   Received from [facility], [facility]   Overall Financial Resource Strain (CARDIA)    Difficulty of Paying Living Expenses: Not hard at all  Food Insecurity: No Food Insecurity (02/26/2023)   Received [facility], [facility]   Hunger Vital Sign    Worried About Running Out of Food in the Last Year: Never true    Ran Out of Food in the Last Year:  Never true  Transportation Needs: No Transportation Needs (02/26/2023)   [facility] from [facility], [facility]   PRAPARE - Transportation    Lack of Transportation (Medical): No    Lack of Transportation (Non-Medical): No  Physical Activity: Not on file  Stress: No Stress Concern Present (07/12/2020)   [employer] of Occupational Health - Occupational Stress Questionnaire    Feeling of Stress : Not at all  Social Connections: Socially Integrated (07/12/2020)   Social Connection and Isolation Panel [NHANES]    Frequency  of Communication with Friends and Family: Three times a week    Frequency of Social Gatherings with Friends and Family: More than three times a week    Attends Religious Services: More than 4 times per year    Active Member of [employer] or Organizations: Yes    Attends Banker Meetings: 1 to 4 times per year    Marital Status: Married     Family History: The patient's family history includes Colon polyps in her father; Diabetes in her father; Parkinson's disease in her mother; Stroke in her father and mother.  ROS:   Please see the history of present illness.     All other systems reviewed and are negative.  EKGs/Labs/Other Studies Reviewed:    The following studies were reviewed today:   EKG:  EKG not ordered today.    Recent Labs: 09/26/2022: TSH 1.42 04/09/2023: ALT 18 05/15/2023: BUN 35; Creatinine 1.44; Hemoglobin 9.9; Platelets 210; Potassium 3.5; Sodium 133  Recent Lipid Panel    Component Value Date/Time   CHOL 123 01/31/2023 1325   TRIG 112.0 01/31/2023 1325   HDL 56.20 01/31/2023 1325   CHOLHDL 2 01/31/2023 1325   VLDL 22.4 01/31/2023 1325   LDLCALC 44 01/31/2023 1325   LDLCALC 101 (H) 06/11/2020 1512   LDLDIRECT 73.0 07/09/2018 1010     Risk Assessment/Calculations:             Physical Exam:    VS:  BP 120/66 (BP Location: Left Arm, Patient Position: Sitting, Cuff Size: Normal)   Pulse (!) 52   Ht 5\' 4"  (1.626  m)   Wt 176 lb 9.6 oz (80.1 kg)   LMP 12/18/1981   SpO2 96%   BMI 30.31 kg/m     Wt Readings from Last 3 Encounters:  07/16/23 176 lb 9.6 oz (80.1 kg)  07/03/23 173 lb (78.5 kg)  06/27/23 173 lb (78.5 kg)     GEN:  Well nourished, well developed in no acute distress HEENT: Normal NECK: No JVD; No carotid bruits CARDIAC: RRR, distant heart sounds RESPIRATORY: Poor inspiratory effort, no wheezing ABDOMEN: Soft, non-tender, non-distended MUSCULOSKELETAL:  1+ edema; varicose veins noted in lower extremity. SKIN: Warm and dry NEUROLOGIC:  Alert and oriented x 3 PSYCHIATRIC:  Normal affect   ASSESSMENT:    1. Paroxysmal SVT (supraventricular tachycardia)   2. Primary hypertension   3. Pure hypercholesterolemia   4. Dependent edema    PLAN:    In order of problems listed above:  History of paroxysmal SVT, echo 7/24 EF 55 to 60%.  Continue Toprol-XL 50 daily. Hypertension, BP controlled, continue Norvasc, losartan, Toprol-XL. Hyperlipidemia, cholesterol controlled, continue Crestor 20 mg daily. Leg edema, appears dependent.  Varicose veins.  Likely has venous insufficiency.  Compression stockings, keeping legs elevated while in seated position advised.  Follow-up in 12 months.     Medication Adjustments/Labs and Tests Ordered: Current medicines are reviewed at length with the patient today.  Concerns regarding medicines are outlined above.  No orders of the defined types were placed in this encounter.  No orders of the defined types were placed in this encounter.   Patient Instructions  Medication Instructions:   Your physician recommends that you continue on your current medications as directed. Please refer to the Current Medication list given to you today.  *If you need a refill on your cardiac medications before your next appointment, please call your pharmacy*   Lab Work:  None Ordered  If you have labs (  blood work) drawn today and your tests are completely  normal, you will receive your results only by: MyChart Message (if you have MyChart) OR A paper copy in the mail If you have any lab test that is abnormal or we need to change your treatment, we will call you to review the results.   Testing/Procedures:  None Ordered   Follow-Up: At [facility], you and your health needs are our priority.  As part of our continuing mission to provide you with exceptional heart care, we have created designated Provider Care Teams.  These Care Teams include your primary Cardiologist (physician) and Advanced Practice Providers (APPs -  Physician Assistants and Nurse Practitioners) who all work together to provide you with the care you need, when you need it.  We recommend signing up for the patient portal called "MyChart".  Sign up information is provided on this After Visit Summary.  MyChart is used to connect with patients for Virtual Visits (Telemedicine).  Patients are able to view lab/test results, encounter notes, upcoming appointments, etc.  Non-urgent messages can be sent to your provider as well.   To learn more about what you can do with MyChart, go to [email or web address].    Your next appointment:   12 month(s)  Provider:   You may see Debbe Odea, MD or one of the following Advanced Practice Providers on your designated Care Team:   Nicolasa Ducking, NP Eula Listen, PA-C Cadence Fransico Michael, PA-C Charlsie Quest, NP   Signed, Debbe Odea, MD  07/16/2023 11:56 AM    [facility]

## 2023-07-16 NOTE — Patient Instructions (Signed)

## 2023-07-17 ENCOUNTER — Telehealth: Payer: Self-pay | Admitting: Urology

## 2023-07-17 ENCOUNTER — Ambulatory Visit: Payer: Medicare HMO | Attending: Pain Medicine | Admitting: Pain Medicine

## 2023-07-17 ENCOUNTER — Encounter: Payer: Self-pay | Admitting: Pain Medicine

## 2023-07-17 VITALS — BP 132/67 | HR 52 | Temp 97.3°F | Resp 16 | Ht 64.0 in | Wt 173.0 lb

## 2023-07-17 DIAGNOSIS — M25511 Pain in right shoulder: Secondary | ICD-10-CM | POA: Diagnosis not present

## 2023-07-17 DIAGNOSIS — M549 Dorsalgia, unspecified: Secondary | ICD-10-CM | POA: Insufficient documentation

## 2023-07-17 DIAGNOSIS — M79604 Pain in right leg: Secondary | ICD-10-CM | POA: Diagnosis not present

## 2023-07-17 DIAGNOSIS — M19011 Primary osteoarthritis, right shoulder: Secondary | ICD-10-CM | POA: Insufficient documentation

## 2023-07-17 DIAGNOSIS — M5431 Sciatica, right side: Secondary | ICD-10-CM | POA: Diagnosis not present

## 2023-07-17 DIAGNOSIS — M47816 Spondylosis without myelopathy or radiculopathy, lumbar region: Secondary | ICD-10-CM | POA: Diagnosis not present

## 2023-07-17 DIAGNOSIS — M5441 Lumbago with sciatica, right side: Secondary | ICD-10-CM | POA: Insufficient documentation

## 2023-07-17 DIAGNOSIS — M48062 Spinal stenosis, lumbar region with neurogenic claudication: Secondary | ICD-10-CM | POA: Diagnosis not present

## 2023-07-17 DIAGNOSIS — M5416 Radiculopathy, lumbar region: Secondary | ICD-10-CM | POA: Diagnosis not present

## 2023-07-17 DIAGNOSIS — M1711 Unilateral primary osteoarthritis, right knee: Secondary | ICD-10-CM | POA: Diagnosis not present

## 2023-07-17 DIAGNOSIS — M7918 Myalgia, other site: Secondary | ICD-10-CM | POA: Diagnosis not present

## 2023-07-17 DIAGNOSIS — M546 Pain in thoracic spine: Secondary | ICD-10-CM | POA: Insufficient documentation

## 2023-07-17 DIAGNOSIS — G8929 Other chronic pain: Secondary | ICD-10-CM | POA: Insufficient documentation

## 2023-07-17 DIAGNOSIS — Z09 Encounter for follow-up examination after completed treatment for conditions other than malignant neoplasm: Secondary | ICD-10-CM | POA: Insufficient documentation

## 2023-07-17 MED ORDER — OXYCODONE HCL 5 MG PO TABS
5.0000 mg | ORAL_TABLET | Freq: Three times a day (TID) | ORAL | 0 refills | Status: DC | PRN
Start: 2023-07-17 — End: 2023-07-31

## 2023-07-17 MED ORDER — OXYCODONE HCL 5 MG PO TABS
5.0000 mg | ORAL_TABLET | Freq: Three times a day (TID) | ORAL | 0 refills | Status: AC | PRN
Start: 2023-08-16 — End: 2023-09-15

## 2023-07-17 MED ORDER — ROPIVACAINE HCL 2 MG/ML IJ SOLN
9.0000 mL | Freq: Once | INTRAMUSCULAR | Status: AC
  Administered 2023-07-17: 9 mL
  Filled 2023-07-17: qty 20

## 2023-07-17 MED ORDER — OXYCODONE HCL 5 MG PO TABS
5.0000 mg | ORAL_TABLET | Freq: Three times a day (TID) | ORAL | 0 refills | Status: AC | PRN
Start: 2023-09-15 — End: 2023-10-15

## 2023-07-17 MED ORDER — TRIAMCINOLONE ACETONIDE 40 MG/ML IJ SUSP
40.0000 mg | Freq: Once | INTRAMUSCULAR | Status: AC
  Administered 2023-07-17: 40 mg
  Filled 2023-07-17: qty 1

## 2023-07-17 MED ORDER — PENTAFLUOROPROP-TETRAFLUOROETH EX AERO
INHALATION_SPRAY | Freq: Once | CUTANEOUS | Status: AC
  Administered 2023-07-17: 30 via TOPICAL
  Filled 2023-07-17: qty 30

## 2023-07-17 NOTE — Progress Notes (Signed)
Safety precautions to be maintained throughout the outpatient stay will include: orient to surroundings, keep bed in low position, maintain call bell within reach at all times, provide assistance with transfer out of bed and ambulation.  

## 2023-07-17 NOTE — Telephone Encounter (Signed)
Left detailed message on VM that we do not and will not have more samples of Myrbetriq.

## 2023-07-17 NOTE — Telephone Encounter (Signed)
Pt stopped by office asking if she could get samples of Myrbetriq.  She wasn't sure of the mg.

## 2023-07-17 NOTE — Progress Notes (Signed)
PROVIDER NOTE: Interpretation of information contained herein should be left to medically-trained personnel. Specific patient instructions are provided elsewhere under "Patient Instructions" section of medical record. This document was created in part using STT-dictation technology, any transcriptional errors that may result from this process are unintentional.  Patient: Wanda Martinez Type: Established DOB: Dec 19, 1934 MRN: [id number] PCP: Dale Seba Dalkai, MD  Service: Procedure DOS: 07/17/2023 Setting: Ambulatory Location: Ambulatory outpatient facility Delivery: Face-to-face Provider: Oswaldo Done, MD Specialty: Interventional Pain Management Specialty designation: 09 Location: Outpatient facility Ref. Prov.: Dale Wales, MD       Interventional Therapy   Type: Trapezius/suprascapular Trigger Point Injection (Myoneural Block) (1-2 muscle groups)  #4 (w/ steroids)  CPT: 20552 Laterality: Right (-RT)   Imaging: N/A. Landmark-guided",           Anesthesia: Local anesthesia (1-2% Lidocaine) Anxiolysis: None                 Sedation: No Sedation                       DOS: 07/17/2023  Performed by: Oswaldo Done, MD  Medical Necessity (reasoning)  Purpose: Diagnostic/Therapeutic Rationale (medical necessity): procedure needed and proper for the diagnosis and/or treatment of Wanda Martinez's medical symptoms and needs. Indications: Right shoulder/upper back pain severe enough to impact quality of life and/or function. 1. Trigger point of shoulder region (Right)   2. Chronic shoulder pain (Right)   3. Chronic upper back pain (Right)   4. Trigger point with back pain (Right)   5. Trigger point of thoracic region (Bilateral) (L>R)    NAS-11 Pain score:   Pre-procedure: 8 /10   Post-procedure: 8 /10       Muscle: Trapezius and suprascapular muscle Target: Myoneural mass of trigger point. Region: Shoulder Anatomic Surface: Posterior Anatomic Area:  Cervico-thoracic Level:  Suprascapular level  Approach: Percutaneous  Type of procedure: Myoneural injection   Position  Prep  Materials  Position: Sitting. Patient assisted into a comfortable position. Pressure points checked.  Prep solution: DuraPrep (Iodine Povacrylex [0.7% available iodine] and Isopropyl Alcohol, 74% w/w) The target area was identified and the area prepped in the usual manner.  Prep Area: Right Posterior Cervicothoracic Region  Materials:   Tray: Block Needle(s):  Type: Regular  Gauge (G): 22  Length: 1.5-in  Qty: 1  H&P (Pre-op Assessment):  Wanda Martinez is a 88 y.o. (year old), female patient, seen today for interventional treatment. She  has a past surgical history that includes Tonsillectomy and adenoidectomy (1979); Vesicovaginal fistula closure w/ TAH (1983); Breast surgery (1986); Breast enhancement surgery (1987); Breast implant removal; Breast implant removal (Right, 08/29/2012); Mastectomy (08/2012); Abdominal hysterectomy; Colonoscopy with propofol (N/A, 09/13/2016); Breast biopsy (2013); and Blepharoplasty. Wanda Martinez has a current medication list which includes the following prescription(s): amlodipine, aspirin ec, calcium carbonate, ciprofloxacin, citalopram, cyclobenzaprine, donepezil, erythromycin, gabapentin, glucosamine-chondroitin, hydrocodone-acetaminophen, hydrocortisone, letrozole, losartan, lubiprostone, magnesium oxide, melatonin, metoprolol succinate, mirabegron er, multivitamin, naloxone, nitrofurantoin (macrocrystal-monohydrate), omega-3, pantoprazole, rosuvastatin, oxycodone, [START ON 08/16/2023] oxycodone, and [START ON 09/15/2023] oxycodone. Her primarily concern today is the Back Pain  Initial Vital Signs:  Pulse/HCG Rate: (!) 52  Temp: (!) 97.3 F (36.3 C) Resp: 16 BP: 132/67 SpO2: 100 %  BMI: Estimated body mass index is 29.7 kg/m as calculated from the following:   Height as of this encounter: 5\' 4"  (1.626 m).   Weight as of this  encounter: 173 lb (78.5 kg).  Risk Assessment: Allergies: Reviewed. She  is allergic to sulfa antibiotics and vesicare [solifenacin].  Allergy Precautions: None required Coagulopathies: Reviewed. None identified.  Blood-thinner therapy: None at this time Active Infection(s): Reviewed. None identified. Wanda Martinez is afebrile  Site Confirmation: Wanda Martinez was asked to confirm the procedure and laterality before marking the site Procedure checklist: Completed Consent: Before the procedure and under the influence of no sedative(s), amnesic(s), or anxiolytics, the patient was informed of the treatment options, risks and possible complications. To fulfill our ethical and legal obligations, as recommended by the American Medical Association's Code of Ethics, I have informed the patient of my clinical impression; the nature and purpose of the treatment or procedure; the risks, benefits, and possible complications of the intervention; the alternatives, including doing nothing; the risk(s) and benefit(s) of the alternative treatment(s) or procedure(s); and the risk(s) and benefit(s) of doing nothing. The patient was provided information about the general risks and possible complications associated with the procedure. These may include, but are not limited to: failure to achieve desired goals, infection, bleeding, organ or nerve damage, allergic reactions, paralysis, and death. In addition, the patient was informed of those risks and complications associated to the procedure, such as failure to decrease pain; infection; bleeding; organ or nerve damage with subsequent damage to sensory, motor, and/or autonomic systems, resulting in permanent pain, numbness, and/or weakness of one or several areas of the body; allergic reactions; (i.e.: anaphylactic reaction); and/or death. Furthermore, the patient was informed of those risks and complications associated with the medications. These include, but are not limited to:  allergic reactions (i.e.: anaphylactic or anaphylactoid reaction(s)); adrenal axis suppression; blood sugar elevation that in diabetics may result in ketoacidosis or comma; water retention that in patients with history of congestive heart failure may result in shortness of breath, pulmonary edema, and decompensation with resultant heart failure; weight gain; swelling or edema; medication-induced neural toxicity; particulate matter embolism and blood vessel occlusion with resultant organ, and/or nervous system infarction; and/or aseptic necrosis of one or more joints. Finally, the patient was informed that Medicine is not an exact science; therefore, there is also the possibility of unforeseen or unpredictable risks and/or possible complications that may result in a catastrophic outcome. The patient indicated having understood very clearly. We have given the patient no guarantees and we have made no promises. Enough time was given to the patient to ask questions, all of which were answered to the patient's satisfaction. Ms. Kniss has indicated that she wanted to continue with the procedure. Attestation: I, the ordering provider, attest that I have discussed with the patient the benefits, risks, side-effects, alternatives, likelihood of achieving goals, and potential problems during recovery for the procedure that I have provided informed consent. Date  Time: 07/17/2023  1:05 PM   Pre-Procedure Preparation:  Monitoring: As per clinic protocol. Respiration, ETCO2, SpO2, BP, heart rate and rhythm monitor placed and checked for adequate function Safety Precautions: Patient was assessed for positional comfort and pressure points before starting the procedure. Time-out: I initiated and conducted the "Time-out" before starting the procedure, as per protocol. The patient was asked to participate by confirming the accuracy of the "Time Out" information. Verification of the correct person, site, and procedure were  performed and confirmed by me, the nursing staff, and the patient. "Time-out" conducted as per Joint Commission's Universal Protocol ([email or web address].01.01.01). Time: 1340 Start Time: 1340 hrs.   Narrative                Start Time: 1340 hrs.  Standard Safety  Precautions: Protocol guidelines were followed. Aspiration was conducted prior to injection. At no point did I inject any substances, as a needle was being advanced. No attempts were made at seeking a paresthesia. Safe injection practices and needle disposal techniques used. Medications properly checked for expiration dates. SDV (single dose vial) medications used.  Local Anesthesia: Skin & deeper tissues infiltrated with local anesthetic. Appropriate amount of time allowed for local anesthetics to take effect.   Technical description:  The target area was identified and the area prepped in the usual manner. The procedure needles were then advanced to the target area. Proper needle placement secured. Negative aspiration confirmed. Solution injected in intermittent fashion, asking for systemic symptoms every 0.5cc of injectate. The needles were then removed and the area cleansed, making sure to leave some of the prepping solution back to take advantage of its long term bactericidal properties.  Vitals:   07/17/23 1303  BP: 132/67  Pulse: (!) 52  Resp: 16  Temp: (!) 97.3 F (36.3 C)  SpO2: 100%  Weight: 173 lb (78.5 kg)  Height: 5\' 4"  (1.626 m)     End Time: 1342 hrs.  Imaging Guidance                Type of Imaging Technique: None used Indication(s): N/A Exposure Time: No patient exposure Contrast: None used. Fluoroscopic Guidance: N/A Ultrasound Guidance: N/A Interpretation: N/A   Post-operative Assessment:  Post-procedure Vital Signs:  Pulse/HCG Rate: (!) 52  Temp: (!) 97.3 F (36.3 C) Resp: 16 BP: 132/67 SpO2: 100 %  EBL: None  Complications: No immediate post-treatment complications observed by team, or reported by  patient.  Note: The patient tolerated the entire procedure well. A repeat set of vitals were taken after the procedure and the patient was kept under observation following institutional policy, for this type of procedure. Post-procedural neurological assessment was performed, showing return to baseline, prior to discharge. The patient was provided with post-procedure discharge instructions, including a section on how to identify potential problems. Should any problems arise concerning this procedure, the patient was given instructions to immediately contact us, at any time, without hesitation. In any case, we plan to contact the patient by telephone for a follow-up status report regarding this interventional procedure.  Comments:  No additional relevant information.   Plan of Care (POC)  Orders:  Orders Placed This Encounter  Procedures   TRIGGER POINT INJECTION    Scheduling Instructions:     Area: Upper Back     Side: Right     Sedation: No Sedation.     Timeframe: Today    Order Specific Question:   Where will this procedure be performed?    Answer:   [facility]   Nursing Instructions:    Please complete this patient's postprocedure evaluation.    Scheduling Instructions:     Please complete this patient's postprocedure evaluation.   Informed Consent Details: Physician/Practitioner Attestation; Transcribe to consent form and obtain patient signature    Provider Attestation: I, Saara Kijowski A. Laban Emperor, MD, (Pain Management Specialist), the physician/practitioner, attest that I have discussed with the patient the benefits, risks, side effects, alternatives, likelihood of achieving goals and potential problems during recovery for the procedure that I have provided informed consent.    Scheduling Instructions:     Note: Always confirm laterality of pain with [name], before procedure.     Transcribe to consent form and obtain patient signature.    Order Specific Question:    Physician/Practitioner  attestation of informed consent for procedure/surgical case    Answer:   I, the physician/practitioner, attest that I have discussed with the patient the benefits, risks, side effects, alternatives, likelihood of achieving goals and potential problems during recovery for the procedure that I have provided informed consent.    Order Specific Question:   Procedure    Answer:   Myoneural Block (Trigger Point injection)    Order Specific Question:   Physician/Practitioner performing the procedure    Answer:   [place] A. Laban Emperor MD    Order Specific Question:   Indication/Reason    Answer:   Musculoskeletal pain/myofascial pain secondary to trigger point   Provide equipment / supplies at bedside    Procedure tray: "Block Tray" (Disposable  single use) Skin infiltration needle: Regular 1.5-in, 25-G, (x1) Block Needle type: Regular Amount/quantity: 1 Size: Short(1.5-inch) Gauge: (25G x1) + (22G x1)    Standing Status:   Standing    Number of Occurrences:   1    Order Specific Question:   Specify    Answer:   Block Tray   Chronic Opioid Analgesic:  Hydrocodone/APAP 5/325, 1 tab PO q 8 hrs (15 mg/day of hydrocodone) MME/day: 15 mg/day.   Medications ordered for procedure: Meds ordered this encounter  Medications   pentafluoroprop-tetrafluoroeth (GEBAUERS) aerosol   triamcinolone acetonide (KENALOG-40) injection 40 mg   ropivacaine (PF) 2 mg/mL (0.2%) (NAROPIN) injection 9 mL   oxyCODONE (OXY IR/ROXICODONE) 5 MG immediate release tablet    Sig: Take 1 tablet (5 mg total) by mouth every 8 (eight) hours as needed for severe pain or breakthrough pain (PRN for post-radiofrequency pain). Must last 30 days.    Dispense:  90 tablet    Refill:  0    DO NOT: delete (not duplicate); no partial-fill (will deny script to complete), no refill request (F/U required). DISPENSE: 1 day early if closed on fill date. WARN: No CNS-depressants within 8 hrs of med.   oxyCODONE (OXY  IR/ROXICODONE) 5 MG immediate release tablet    Sig: Take 1 tablet (5 mg total) by mouth every 8 (eight) hours as needed for severe pain or breakthrough pain (PRN for post-radiofrequency pain). Must last 30 days.    Dispense:  90 tablet    Refill:  0    DO NOT: delete (not duplicate); no partial-fill (will deny script to complete), no refill request (F/U required). DISPENSE: 1 day early if closed on fill date. WARN: No CNS-depressants within 8 hrs of med.   oxyCODONE (OXY IR/ROXICODONE) 5 MG immediate release tablet    Sig: Take 1 tablet (5 mg total) by mouth every 8 (eight) hours as needed for severe pain or breakthrough pain (PRN for post-radiofrequency pain). Must last 30 days.    Dispense:  90 tablet    Refill:  0    DO NOT: delete (not duplicate); no partial-fill (will deny script to complete), no refill request (F/U required). DISPENSE: 1 day early if closed on fill date. WARN: No CNS-depressants within 8 hrs of med.   Medications administered: We administered pentafluoroprop-tetrafluoroeth, triamcinolone acetonide, and ropivacaine (PF) 2 mg/mL (0.2%).  See the medical record for exact dosing, route, and time of administration.  Follow-up plan:   Return in about 2 weeks (around 07/31/2023) for Proc-day (T,Th), (Face2F), (PPE).       Interventional Therapies  Risk Factors  Considerations:   Parkinson's disease  Hx right breast cancer  OIC  SOB  CKD Stage 3  GERD  depression  osteopenia/osteoporosis     Planned  Pending:   Therapeutic right L5 #5 and left L2 TFESI #2  Therapeutic/palliative right IA knee Zilretta knee injection #2    Under consideration:   Therapeutic right SI joint injection #4  Diagnostic right genicular NB #1  Diagnostic bilateral cervical facet MBB  Therapeutic right L3 and L4 TFESI #1    Completed:   Therapeutic right superior/lateral trapezius muscle TPI x1 (04/19/2021)  Therapeutic right thoracic back TPI x2 (10/17/2018) (DKFUA)  Therapeutic  left thoracic back TPI x1 (10/17/2018) (DKFUA)  Therapeutic right quadratus lumborum and erector spinae muscle TPI x1 (02/12/2018)  Therapeutic bilateral lumbar facet MBB (L2-S1) (FCT: L3-4,L4-5,L5-S1) x5 (08/09/2021) (100/100/50)  Therapeutic bilateral lumbar facet MBB (L4-S1) (FCT: L5-S1) x1 (08/29/2022) (100/100/100 x3 days/R:0L:100)  Therapeutic right lumbar facet MBB (L2-S1) x4 (12/21/2020) (DKFUA)  Therapeutic right lumbar facet RFA (L2-S1) x3 (09/28/2022) (100/100/75/75)  Therapeutic right lumbar facet RFA (L3-S1) x1 (03/01/2023) (100/100/70/70)  Therapeutic left lumbar facet RFA x2 (11/30/2022) (100/60/90/>75)  Diagnostic right SI joint Blk x3 (12/21/2020)  (DKFUA)  Diagnostic left SI joint Blk x2 (04/12/2023) (100/90/20/<25)  Therapeutic right SI joint RFA x4 (03/01/2023) (100/100/70/70)  Therapeutic midline L2-3 LESI x1 (10/04/2017) (100/100/85/>50)  Therapeutic right L3-4 LESI x2 (11/04/2020) (100/100/20/<50)  Therapeutic right L4-5 LESI x2 (03/30/2022) (100/100/75/75)  Therapeutic right L5-S1 LESI x2 (12/04/2019) (100/100/50/75)  Diagnostic/therapeutic right L3 and L4 TFESI x1 (07/27/2022) (100/100/75/50)  Therapeutic right L5 TFESI x4 (02/14/2022) (100/100/70/75)  Therapeutic right S1 TFESI x1 (11/04/2020) (100/100/20/<50)  Diagnostic bilateral superficial trochanteric bursa injec. x1 (04/30/2018) (DKFUA)  Diagnostic right IA hip injection x1 (02/13/2019) (DKFUA)  Diagnostic/therapeutic right IA steroid knee inj. x4 (01/10/2023) (100/100/100/80)  Therapeutic right (Zilretta) knee injection x1 (10/06/2021) (DKFUA)  Therapeutic right (Monovisc) knee injection x1 (06/07/2021) (100/100/100/75)    Therapeutic  Palliative (PRN) options:   Therapeutic/palliative lumbar facet RFA  Therapeutic/palliative lumbar facet MBB  Therapeutic left L2 TFESI (this level is responsible for her low back pain and left lower extremity pain.)   Pharmacotherapy  Nonopioids transfer 12/21/2020: Gabapentin        Recent Visits Date Type Provider Dept  07/03/23 Procedure visit [name], MD [facility]  06/27/23 Office Visit [name], MD [facility]  06/19/23 Office Visit [name], MD [facility]  06/05/23 Procedure visit [name], MD [facility]  05/22/23 Office Visit [name], MD [facility]  04/26/23 Procedure visit [name], MD [facility]  04/25/23 Office Visit [name], MD [facility]  Showing recent visits within past 90 days and meeting all other requirements Today's Visits Date Type Provider Dept  07/17/23 Office Visit [name], MD [facility]  Showing today's visits and meeting all other requirements Future Appointments Date Type Provider Dept  07/25/23 Appointment [name], MD [facility]  Showing future appointments within next 90 days and meeting all other requirements  Disposition: Discharge home  Discharge (Date  Time): 07/17/2023; 1351 hrs.   Primary Care Physician: Dale Atwater, MD Location: [facility] Note by: Oswaldo Done, MD (TTS technology used. I apologize for any typographical errors that were not detected and corrected.) Date: 07/17/2023; Time: 2:00 PM  Disclaimer:  Medicine is not an Visual merchandiser. The only guarantee in medicine is that nothing is guaranteed. It is important to note that the decision to proceed with this intervention was based on the information collected from the patient. The Data and conclusions were drawn from the  patient's questionnaire, the interview, and the physical examination. Because the information was provided in large part by the patient, it cannot be guaranteed that it has not been purposely or unconsciously manipulated. Every effort has been made to obtain as much relevant data as possible for this evaluation. It is  important to note that the conclusions that lead to this procedure are derived in large part from the available data. Always take into account that the treatment will also be dependent on availability of resources and existing treatment guidelines, considered by other Pain Management Practitioners as being common knowledge and practice, at the time of the intervention. For Medico-Legal purposes, it is also important to point out that variation in procedural techniques and pharmacological choices are the acceptable norm. The indications, contraindications, technique, and results of the above procedure should only be interpreted and judged by a Board-Certified Interventional Pain Specialist with extensive familiarity and expertise in the same exact procedure and technique.

## 2023-07-17 NOTE — Progress Notes (Signed)
Pt pharmacy was called to cancel any prescriptions for hydrocodone/apap. A new prescription for oxycodone was send.

## 2023-07-17 NOTE — Patient Instructions (Signed)

## 2023-07-19 ENCOUNTER — Other Ambulatory Visit: Payer: Self-pay | Admitting: Internal Medicine

## 2023-07-23 DIAGNOSIS — R29898 Other symptoms and signs involving the musculoskeletal system: Secondary | ICD-10-CM | POA: Diagnosis not present

## 2023-07-23 DIAGNOSIS — R251 Tremor, unspecified: Secondary | ICD-10-CM | POA: Diagnosis not present

## 2023-07-23 DIAGNOSIS — M545 Low back pain, unspecified: Secondary | ICD-10-CM | POA: Diagnosis not present

## 2023-07-23 DIAGNOSIS — F32A Depression, unspecified: Secondary | ICD-10-CM | POA: Diagnosis not present

## 2023-07-23 DIAGNOSIS — M25561 Pain in right knee: Secondary | ICD-10-CM | POA: Diagnosis not present

## 2023-07-23 DIAGNOSIS — R413 Other amnesia: Secondary | ICD-10-CM | POA: Diagnosis not present

## 2023-07-23 DIAGNOSIS — M542 Cervicalgia: Secondary | ICD-10-CM | POA: Diagnosis not present

## 2023-07-25 ENCOUNTER — Encounter: Payer: Medicare HMO | Admitting: Pain Medicine

## 2023-07-30 ENCOUNTER — Telehealth: Payer: Self-pay

## 2023-07-30 ENCOUNTER — Telehealth: Payer: Self-pay | Admitting: Pain Medicine

## 2023-07-30 ENCOUNTER — Telehealth (INDEPENDENT_AMBULATORY_CARE_PROVIDER_SITE_OTHER): Payer: Medicare HMO | Admitting: Family Medicine

## 2023-07-30 ENCOUNTER — Encounter: Payer: Self-pay | Admitting: Family Medicine

## 2023-07-30 VITALS — BP 134/63 | Ht 64.0 in | Wt 173.0 lb

## 2023-07-30 DIAGNOSIS — R399 Unspecified symptoms and signs involving the genitourinary system: Secondary | ICD-10-CM

## 2023-07-30 NOTE — Progress Notes (Signed)
Virtual Visit via Telephone Note  I connected with Wanda Martinez on 08/12/23 at 1630 by telephone and verified that I am speaking with the correct person using two identifiers. Wanda Martinez is currently located at home and is currently alone during this visit. The provider, Dana Allan, MD, is located in their office at time of visit.  I discussed the limitations, risks, security and privacy concerns of performing an evaluation and management service by telephone and the availability of in person appointments. I also discussed with the patient that there may be a patient responsible charge related to this service. The patient expressed understanding and agreed to proceed.  Subjective: PCP: Dale [place], MD  Chief Complaint  Patient presents with   Urinary Tract Infection    Burning and pressure X 1 week Pt bringing urine sample tomorrow.     Symptoms started 1 week ago. Endorses pressure in lower abdomen and some burning sensation when urinating Denies any fever, hematuria, urgency, frequency or back pain Has been followed by Urology and prescribed Macrobid. Patient reports she has run out as she doubled up the dose to help with symptoms.     ROS: Per HPI  Current Outpatient Medications:    amLODipine (NORVASC) 2.5 MG tablet, Take 1 tablet (2.5 mg total) by mouth daily., Disp: 30 tablet, Rfl: 0   aspirin 81 MG EC tablet, Take 81 mg by mouth daily as needed., Disp: , Rfl:    calcium carbonate (OSCAL) 1500 (600 Ca) MG TABS tablet, Take 1 tablet by mouth daily., Disp: , Rfl:    ciprofloxacin (CIPRO) 500 MG tablet, Take 500 mg by mouth 2 (two) times daily., Disp: , Rfl:    citalopram (CELEXA) 20 MG tablet, TAKE 1 TABLET BY MOUTH ONCE DAILY., Disp: 30 tablet, Rfl: 11   cyclobenzaprine (FLEXERIL) 10 MG tablet, TAKE ONE TABLET BY MOUTH AT BEDTIME., Disp: 30 tablet, Rfl: 11   donepezil (ARICEPT) 10 MG tablet, TAKE ONE TABLET BY MOUTH AT BEDTIME, Disp: 30 tablet, Rfl: 11    erythromycin ophthalmic ointment, Apply to eye., Disp: , Rfl:    gabapentin (NEURONTIN) 300 MG capsule, Take 1 capsule (300 mg total) by mouth at bedtime., Disp: 90 capsule, Rfl: 0   Glucosamine-Chondroitin 500-400 MG CAPS, Take 2 capsules by mouth daily., Disp: , Rfl:    hydrocortisone (ANUSOL-HC) 25 MG suppository, Place 1 suppository (25 mg total) rectally 2 (two) times daily., Disp: 12 suppository, Rfl: 0   letrozole (FEMARA) 2.5 MG tablet, Take 2.5 mg by mouth daily., Disp: , Rfl:    losartan (COZAAR) 50 MG tablet, TAKE (2) TABLETS BY MOUTH ONCE DAILY., Disp: 60 tablet, Rfl: 11   lubiprostone (AMITIZA) 24 MCG capsule, TAKE 1 CAPSULE BY MOUTH ONCE DAILY WITH BREAKFAST., Disp: , Rfl:    magnesium oxide (MAG-OX) 400 (240 Mg) MG tablet, TAKE ONE TABLET BY MOUTH EVERY DAY, Disp: 30 tablet, Rfl: 11   melatonin 1 MG TABS tablet, Take 3 mg by mouth at bedtime., Disp: , Rfl:    metoprolol succinate (TOPROL-XL) 25 MG 24 hr tablet, Take 1 tablet (25 mg total) by mouth 2 (two) times daily., Disp: 180 tablet, Rfl: 1   mirabegron ER (MYRBETRIQ) 50 MG TB24 tablet, Take 1 tablet (50 mg total) by mouth daily., Disp: 30 tablet, Rfl: 11   Multiple Vitamin (MULTIVITAMIN) tablet, Take 1 tablet by mouth daily., Disp: , Rfl:    naloxone (NARCAN) nasal spray 4 mg/0.1 mL, Place 1 spray into the nose as  needed for up to 365 doses (for opioid-induced respiratory depresssion). In case of emergency (overdose), spray once into each nostril. If no response within 3 minutes, repeat application and call 911., Disp: 1 each, Rfl: 0   nitrofurantoin, macrocrystal-monohydrate, (MACROBID) 100 MG capsule, Take 1 capsule (100 mg total) by mouth daily., Disp: 90 capsule, Rfl: 3   Omega-3 1000 MG CAPS, Take by mouth., Disp: , Rfl:    [START ON 08/16/2023] oxyCODONE (OXY IR/ROXICODONE) 5 MG immediate release tablet, Take 1 tablet (5 mg total) by mouth every 8 (eight) hours as needed for severe pain or breakthrough pain (PRN for  post-radiofrequency pain). Must last 30 days., Disp: 90 tablet, Rfl: 0   [START ON 09/15/2023] oxyCODONE (OXY IR/ROXICODONE) 5 MG immediate release tablet, Take 1 tablet (5 mg total) by mouth every 8 (eight) hours as needed for severe pain or breakthrough pain (PRN for post-radiofrequency pain). Must last 30 days., Disp: 90 tablet, Rfl: 0   pantoprazole (PROTONIX) 40 MG tablet, Take 1 tablet (40 mg total) by mouth daily., Disp: 30 tablet, Rfl: 11   rosuvastatin (CRESTOR) 20 MG tablet, TAKE ONE TABLET BY MOUTH AT BEDTIME, Disp: 30 tablet, Rfl: 11   [START ON 10/15/2023] oxyCODONE (OXY IR/ROXICODONE) 5 MG immediate release tablet, Take 1 tablet (5 mg total) by mouth every 8 (eight) hours as needed for severe pain or breakthrough pain (PRN for post-radiofrequency pain). Must last 30 days., Disp: 90 tablet, Rfl: 0  Observations/Objective: A&O  No respiratory distress or wheezing audible over the phone Mood, judgement, and thought processes all WNL  Assessment and Plan: Urinary tract infection symptoms Assessment & Plan: 1 week of symptoms Currently on prophylactic treatment with Macrobid and follows with Urology Request urine sample be brought to office in am for evaluation   Orders: -     Urine Culture; Future -     Urinalysis, Routine w reflex microscopic; Future -     POCT urinalysis dipstick; Future   Urine culture negative for bacterial growth  Follow Up Instructions: Return if symptoms worsen or fail to improve.  I discussed the assessment and treatment plan with the patient. The patient was provided an opportunity to ask questions and all were answered. The patient agreed with the plan and demonstrated an understanding of the instructions.   The patient was advised to call back or seek an in-person evaluation if the symptoms worsen or if the condition fails to improve as anticipated.  The above assessment and management plan was discussed with the patient. The patient verbalized  understanding of and has agreed to the management plan. Patient is aware to call the clinic if symptoms persist or worsen. Patient is aware when to return to the clinic for a follow-up visit. Patient educated on when it is appropriate to go to the emergency department.     Dana Allan, MD

## 2023-07-30 NOTE — Progress Notes (Unsigned)
PROVIDER NOTE: Interpretation of information contained herein should be left to medically-trained personnel. Specific patient instructions are provided elsewhere under "Patient Instructions" section of medical record. This document was created in part using STT-dictation technology, any transcriptional errors that may result from this process are unintentional.  Patient: Wanda Martinez Type: Established DOB: [birth year] MRN: [id number] PCP: Dale Manatee, MD  Service: Procedure DOS: 07/31/2023 Setting: Ambulatory Location: Ambulatory outpatient facility Delivery: Face-to-face Provider: Oswaldo Done, MD Specialty: Interventional Pain Management Specialty designation: 09 Location: Outpatient facility Ref. Prov.: Dale Eminence, MD       Interventional Therapy   Type:  Zilretta (ER-triamcinolone/steroid) Intra-articular Knee Injection #2  Laterality: Right (-RT) Level/approach: Lateral Imaging guidance: None required ([id number]) Anesthesia: Local anesthesia (1-2% Lidocaine) Anxiolysis: None                 Sedation: No Sedation                       DOS: 07/31/2023  Performed by: Oswaldo Done, MD  Purpose: Diagnostic/Therapeutic Indications: Knee arthralgia associated to osteoarthritis of the knee 1. Chronic knee pain (Right)   2. Osteoarthritis of knee (Right)   3. Tricompartment osteoarthritis of knee (Right)    NAS-11 score:   Pre-procedure: 8 /10   Post-procedure: 8 /10     Pre-Procedure Preparation  Monitoring: As per clinic protocol.  Risk Assessment: Vitals:  [id number] body mass index is 29.7 kg/m as calculated from the following:   Height as of this encounter: 5\' 4"  (1.626 m).   Weight as of this encounter: 173 lb (78.5 kg)., Rate:(!) 59 , BP:104/68, Resp:14, Temp:(!) 97.3 F (36.3 C), SpO2:100 %  Allergies: She is allergic to sulfa antibiotics and vesicare [solifenacin].  Precautions: No additional precautions required  Blood-thinner(s):  None at this time  Coagulopathies: Reviewed. None identified.   Active Infection(s): Reviewed. None identified. Wanda Martinez is afebrile   Location setting: Exam room Position: Sitting w/ knee bent 90 degrees Safety Precautions: Patient was assessed for positional comfort and pressure points before starting the procedure. Prepping solution: DuraPrep (Iodine Povacrylex [0.7% available iodine] and Isopropyl Alcohol, 74% w/w) Prep Area: Entire knee region Approach: percutaneous, just above the tibial plateau, lateral to the infrapatellar tendon. Intended target: Intra-articular knee space Materials: Tray: Block Needle(s): Regular Qty: 1/side Length: 1.5-inch Gauge: 25G (x1) + 22G (x1)  Meds ordered this encounter  Medications   pentafluoroprop-tetrafluoroeth (GEBAUERS) aerosol   lidocaine HCl (PF) (XYLOCAINE) 2 % injection 5 mL   ropivacaine (PF) 2 mg/mL (0.2%) (NAROPIN) injection 3 mL   oxyCODONE (OXY IR/ROXICODONE) 5 MG immediate release tablet    Sig: Take 1 tablet (5 mg total) by mouth every 8 (eight) hours as needed for severe pain or breakthrough pain (PRN for post-radiofrequency pain). Must last 30 days.    Dispense:  90 tablet    Refill:  0    DO NOT: delete (not duplicate); no partial-fill (will deny script to complete), no refill request (F/U required). DISPENSE: 1 day early if closed on fill date. WARN: No CNS-depressants within 8 hrs of med.   Triamcinolone Acetonide (ZILRETTA) intra-articular injection 32 mg    Maintain refrigerated.  Prepared suspension may be stored up to 4 hours at ambient conditions.    Orders Placed This Encounter  Procedures   KNEE INJECTION    Local Anesthetic & Steroid injection.    Scheduling Instructions:     Side(s): Right Knee     Sedation: None  Timeframe: Today    Order Specific Question:   Where will this procedure be performed?    Answer:   [facility]   Lumbar Epidural Injection    Standing Status:   Future    Standing  Expiration Date:   10/31/2023    Scheduling Instructions:     Procedure: Interlaminar Lumbar Epidural Steroid injection (LESI)  L4-5     Laterality: Right-sided     Sedation: Patient's choice.     Timeframe: ASAA    Order Specific Question:   Where will this procedure be performed?    Answer:   [facility]   ToxASSURE Select 13 (MW), Urine    Volume: 30 ml(s). Minimum 3 ml of urine is needed. Document temperature of fresh sample. Indications: Long term (current) use of opiate analgesic (M57.846)    Order Specific Question:   Release to patient    Answer:   Immediate   Nursing Instructions:    Please complete this patient's postprocedure evaluation.    Scheduling Instructions:     Please complete this patient's postprocedure evaluation.   Informed Consent Details: Physician/Practitioner Attestation; Transcribe to consent form and obtain patient signature    Note: Always confirm laterality of pain with Wanda Martinez, before procedure. Transcribe to consent form and obtain patient signature.    Order Specific Question:   Physician/Practitioner attestation of informed consent for procedure/surgical case    Answer:   I, the physician/practitioner, attest that I have discussed with the patient the benefits, risks, side effects, alternatives, likelihood of achieving goals and potential problems during recovery for the procedure that I have provided informed consent.    Order Specific Question:   Procedure    Answer:   Right-sided intra-articular knee arthrocentesis (aspiration and/or injection)    Order Specific Question:   Physician/Practitioner performing the procedure    Answer:    A. Laban Emperor, MD    Order Specific Question:   Indication/Reason    Answer:   Chronic right-sided knee pain secondary to knee arthropathy/arthralgia   Provide equipment / supplies at bedside    Procedure tray: "Block Tray" (Disposable  single use) Skin infiltration needle: Regular 1.5-in, 25-G,  (x1) Block Needle type: Regular Amount/quantity: 2 Size: Short(1.5-inch) Gauge: (25G x1) + (22G x1)    Standing Status:   Standing    Number of Occurrences:   1    Order Specific Question:   Specify    Answer:   Block Tray   Nursing Instructions:    1). STAT: UDS required today. 2). Make sure to document all opioids and benzodiazepines taken, including time of last intake. 3). If order is entered on a procedure day, make sure sample is obtained before any medications are administered.     Time-out: 1344 I initiated and conducted the "Time-out" before starting the procedure, as per protocol. The patient was asked to participate by confirming the accuracy of the "Time Out" information. Verification of the correct person, site, and procedure were performed and confirmed by me, the nursing staff, and the patient. "Time-out" conducted as per Joint Commission's Universal Protocol (UP.01.01.01). Procedure checklist: Completed  H&P (Pre-op  Assessment)  Ms. Hetman is a 88 y.o. (year old), female patient, seen today for interventional treatment. She  has a past surgical history that includes Tonsillectomy and adenoidectomy (1979); Vesicovaginal fistula closure w/ TAH (1983); Breast surgery (1986); Breast enhancement surgery (1987); Breast implant removal; Breast implant removal (Right, 08/29/2012); Mastectomy (08/2012); Abdominal hysterectomy; Colonoscopy with propofol (N/A, 09/13/2016);  Breast biopsy (2013); and Blepharoplasty. Ms. Kaul has a current medication list which includes the following prescription(s): amlodipine, aspirin ec, calcium carbonate, ciprofloxacin, citalopram, cyclobenzaprine, donepezil, erythromycin, gabapentin, glucosamine-chondroitin, hydrocortisone, letrozole, losartan, lubiprostone, magnesium oxide, melatonin, metoprolol succinate, mirabegron er, multivitamin, naloxone, nitrofurantoin (macrocrystal-monohydrate), omega-3, [START ON 08/16/2023] oxycodone, [START ON 09/15/2023]  oxycodone, pantoprazole, rosuvastatin, and [START ON 10/15/2023] oxycodone. Her primarily concern today is the Knee Pain (right)  She is allergic to sulfa antibiotics and vesicare [solifenacin].   Last encounter: My last encounter with her was on 07/30/2023. Pertinent problems: Ms. Gatta has Headache; Parkinson's disease; Chronic low back pain (1ry area of Pain) (Bilateral) (R>L) w/o sciatica; Lumbar spondylosis; Chronic hip pain (Bilateral); Chronic neck pain; Cervical spondylosis; Chronic cervical radicular pain (Right); Diffuse myofascial pain syndrome; Neurogenic pain; Chronic upper back pain (Right); Myofascial pain syndrome (Right) (cervicothoracic); Lumbar facet syndrome (Bilateral) (R>L); History of breast cancer; Cervical facet hypertrophy; Cervical facet syndrome (HCC); Chronic shoulder pain (Right); Chronic pain syndrome; Chronic sacroiliac joint pain (Left); Chronic sacroiliac joint pain (Right); 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, L4-5, L5-S1) (Right); Lumbar spinal stenosis (with neurogenic claudication) (L3-4); Chronic lower extremity pain (Right); Chronic lumbar radicular pain (Right); Trochanteric bursitis of hip (Bilateral); Spondylosis without myelopathy or radiculopathy, lumbosacral region; Trigger point with back pain (Right); DDD (degenerative disc disease), lumbosacral; Chronic upper extremity pain (Right); Chronic thoracic back pain (Bilateral) (L>R); Trigger point of thoracic region (Bilateral) (L>R); Other specified dorsopathies, sacral and sacrococcygeal region; Sacroiliac joint dysfunction (Right); Osteoarthritis of sacroiliac joint (Right); Somatic dysfunction of sacroiliac joint (Right); Chronic musculoskeletal pain; Facial pain; Chronic hip pain (Right); Osteoarthritis of hip (Right); Left ear pain; 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HCC); Abnormal MRI, lumbar spine (08/23/2022); Osteoarthritis involving multiple joints; Other spondylosis, sacral and sacrococcygeal region; Lumbar  facet hypertrophy (Multilevel) (Bilateral); Chronic knee pain (Right); Osteoarthritis of knee (Right); Trigger point of shoulder region (Right); Osteoarthritis of AC (acromioclavicular) joint (Right); Osteoarthritis of shoulder (Right); Unspecified injury of muscle(s) and tendon(s) of the rotator cuff of shoulder, sequela (Right); Cervicalgia; DDD (degenerative disc disease), lumbar; Lumbar radicular pain (Bilateral); Lumbar foraminal stenosis (Right: L3-4, L4-5, L5-S1) (Left: L2-3); Abnormal MRI, hip (Right) (05/04/2022); Abnormal MRI, knee (01/04/2022); Tricompartment osteoarthritis of knee (Right); Chronic low back pain (Right) w/o sciatica; Hoffa's fat pad disease (HCC) (Right); Hoffa's pad syndrome (HCC) (Right); Chronic low back pain (Right) w/ sciatica (Right); Lower extremity weakness (Right); Chronic radicular pain of lower back; Chronic sciatica (Right); Lumbosacral radiculopathy at L5 (Right); and Low back pain of over 3 months duration on their pertinent problem list. Pain Assessment: Severity of Chronic pain is reported as a 8 /10. Location: Knee Right/ . Onset: More than a month ago. Quality: Aching. Timing: Constant. Modifying factor(s): Aspercreme. Vitals:  height is 5\' 4"  (1.626 m) and weight is 173 lb (78.5 kg). Her temporal temperature is 97.3 F (36.3 C) (abnormal). Her blood pressure is 104/68 and her pulse is 59 (abnormal). Her respiration is 14 and oxygen saturation is 100%.   Reason for encounter: "interventional pain management therapy due pain of at least four (4) weeks in duration, with failure to respond and/or inability to tolerate more conservative care.  Site Confirmation: Ms. Jost was asked to confirm the procedure and laterality before marking the site.  Consent: Before the procedure and under the influence of no sedative(s), amnesic(s), or anxiolytics, the patient was informed of the treatment options, risks and possible complications. To fulfill our ethical and legal  obligations, as recommended by the American Medical Association's Code of Ethics,  I have informed the patient of my clinical impression; the nature and purpose of the treatment or procedure; the risks, benefits, and possible complications of the intervention; the alternatives, including doing nothing; the risk(s) and benefit(s) of the alternative treatment(s) or procedure(s); and the risk(s) and benefit(s) of doing nothing. The patient was provided information about the general risks and possible complications associated with the procedure. These may include, but are not limited to: failure to achieve desired goals, infection, bleeding, organ or nerve damage, allergic reactions, paralysis, and death. In addition, the patient was informed of those risks and complications associated to Spine-related procedures, such as failure to decrease pain; infection (i.e.: Meningitis, epidural or intraspinal abscess); bleeding (i.e.: epidural hematoma, subarachnoid hemorrhage, or any other type of intraspinal or peri-dural bleeding); organ or nerve damage (i.e.: Any type of peripheral nerve, nerve root, or spinal cord injury) with subsequent damage to sensory, motor, and/or autonomic systems, resulting in permanent pain, numbness, and/or weakness of one or several areas of the body; allergic reactions; (i.e.: anaphylactic reaction); and/or death. Furthermore, the patient was informed of those risks and complications associated with the medications. These include, but are not limited to: allergic reactions (i.e.: anaphylactic or anaphylactoid reaction(s)); adrenal axis suppression; blood sugar elevation that in diabetics may result in ketoacidosis or comma; water retention that in patients with history of congestive heart failure may result in shortness of breath, pulmonary edema, and decompensation with resultant heart failure; weight gain; swelling or edema; medication-induced neural toxicity; particulate matter embolism and  blood vessel occlusion with resultant organ, and/or nervous system infarction; and/or aseptic necrosis of one or more joints. Finally, the patient was informed that Medicine is not an exact science; therefore, there is also the possibility of unforeseen or unpredictable risks and/or possible complications that may result in a catastrophic outcome. The patient indicated having understood very clearly. We have given the patient no guarantees and we have made no promises. Enough time was given to the patient to ask questions, all of which were answered to the patient's satisfaction. Ms. Klitzke has indicated that she wanted to continue with the procedure. Attestation: I, the ordering provider, attest that I have discussed with the patient the benefits, risks, side-effects, alternatives, likelihood of achieving goals, and potential problems during recovery for the procedure that I have provided informed consent.  Date  Time: 07/31/2023  1:28 PM  Description of procedure  Start Time: 1344 hrs  Local Anesthesia: Once the patient was positioned, prepped, and time-out was completed. The target area was identified located. The skin was marked with an approved surgical skin marker. Once marked, the skin (epidermis, dermis, and hypodermis), and deeper tissues (fat, connective tissue and muscle) were infiltrated with a small amount of a short-acting local anesthetic, loaded on a 10cc syringe with a 25G, 1.5-in  Needle. An appropriate amount of time was allowed for local anesthetics to take effect before proceeding to the next step. Local Anesthetic: Lidocaine 1-2% The unused portion of the local anesthetic was discarded in the proper designated containers. Safety Precautions: Aspiration looking for blood return was conducted prior to all injections. At no point did I inject any substances, as a needle was being advanced. Before injecting, the patient was told to immediately notify me if she was experiencing any new  onset of "ringing in the ears, or metallic taste in the mouth". No attempts were made at seeking any paresthesias. Safe injection practices and needle disposal techniques used. Medications properly checked for expiration dates. SDV (single  dose vial) medications used. After the completion of the procedure, all disposable equipment used was discarded in the proper designated medical waste containers.  Technical description: Protocol guidelines were followed. After positioning, the target area was identified and prepped in the usual manner. Skin & deeper tissues infiltrated with local anesthetic. Appropriate amount of time allowed to pass for local anesthetics to take effect. Proper needle placement secured. Once satisfactory needle placement was confirmed, I proceeded to inject the desired solution in slow, incremental fashion, intermittently assessing for discomfort or any signs of abnormal or undesired spread of substance. Once completed, the needle was removed and disposed of, as per hospital protocols. The area was cleaned, making sure to leave some of the prepping solution back to take advantage of its long term bactericidal properties.  Aspiration:  Negative        Vitals:   07/31/23 1328  BP: 104/68  Pulse: (!) 59  Resp: 14  Temp: (!) 97.3 F (36.3 C)  TempSrc: Temporal  SpO2: 100%  Weight: 173 lb (78.5 kg)  Height: 5\' 4"  (1.626 m)    End Time: 1346 hrs  Imaging guidance  Imaging-assisted Technique: None required. Indication(s): N/A Exposure Time: N/A Contrast: None Fluoroscopic Guidance: N/A Ultrasound Guidance: N/A Interpretation: N/A  Post-op assessment  Post-procedure Vital Signs:  Pulse/HCG Rate: (!) 59  Temp: (!) 97.3 F (36.3 C) Resp: 14 BP: 104/68 SpO2: 100 %  EBL: None  Complications: No immediate post-treatment complications observed by team, or reported by patient.  Note: The patient tolerated the entire procedure well. A repeat set of vitals were  taken after the procedure and the patient was kept under observation following institutional policy, for this type of procedure. Post-procedural neurological assessment was performed, showing return to baseline, prior to discharge. The patient was provided with post-procedure discharge instructions, including a section on how to identify potential problems. Should any problems arise concerning this procedure, the patient was given instructions to immediately contact us, at any time, without hesitation. In any case, we plan to contact the patient by telephone for a follow-up status report regarding this interventional procedure.  Comments:  No additional relevant information.  Plan of care  Chronic Opioid Analgesic:  Hydrocodone/APAP 5/325, 1 tab PO q 8 hrs (15 mg/day of hydrocodone) MME/day: 15 mg/day.   Medications administered: We administered pentafluoroprop-tetrafluoroeth, lidocaine HCl (PF), ropivacaine (PF) 2 mg/mL (0.2%), and Triamcinolone Acetonide.  Follow-up plan:   Return in about 2 weeks (around 08/14/2023) for [facility]): (R) L4-5 LESI #3.      Interventional Therapies  Risk Factors  Considerations:   Parkinson's disease  Hx right breast cancer  OIC  SOB  CKD Stage 3  GERD  depression  osteopenia/osteoporosis     Planned  Pending:   Therapeutic right L5 #5 and left L2 TFESI #2  Therapeutic/palliative right IA knee Zilretta knee injection #2    Under consideration:   Therapeutic right SI joint injection #4  Diagnostic right genicular NB #1  Diagnostic bilateral cervical facet MBB  Therapeutic right L3 and L4 TFESI #1    Completed:   Therapeutic right superior/lateral trapezius muscle TPI x1 (04/19/2021)  Therapeutic right thoracic back TPI x2 (10/17/2018) (DKFUA)  Therapeutic left thoracic back TPI x1 (10/17/2018) (DKFUA)  Therapeutic right quadratus lumborum and erector spinae muscle TPI x1 (02/12/2018)  Therapeutic bilateral lumbar facet MBB (L2-S1) (FCT:  L3-4,L4-5,L5-S1) x5 (08/09/2021) (100/100/50)  Therapeutic bilateral lumbar facet MBB (L4-S1) (FCT: L5-S1) x1 (08/29/2022) (100/100/100 x3 days/R:0L:100)  Therapeutic right lumbar  facet MBB (L2-S1) x4 (12/21/2020) (DKFUA)  Therapeutic right lumbar facet RFA (L2-S1) x3 (09/28/2022) (100/100/75/75)  Therapeutic right lumbar facet RFA (L3-S1) x1 (03/01/2023) (100/100/70/70)  Therapeutic left lumbar facet RFA x2 (11/30/2022) (100/60/90/>75)  Diagnostic right SI joint Blk x3 (12/21/2020)  (DKFUA)  Diagnostic left SI joint Blk x2 (04/12/2023) (100/90/20/<25)  Therapeutic right SI joint RFA x4 (03/01/2023) (100/100/70/70)  Therapeutic midline L2-3 LESI x1 (10/04/2017) (100/100/85/>50)  Therapeutic right L3-4 LESI x2 (11/04/2020) (100/100/20/<50)  Therapeutic right L4-5 LESI x2 (03/30/2022) (100/100/75/75)  Therapeutic right L5-S1 LESI x2 (12/04/2019) (100/100/50/75)  Diagnostic/therapeutic right L3 and L4 TFESI x1 (07/27/2022) (100/100/75/50)  Therapeutic right L5 TFESI x4 (02/14/2022) (100/100/70/75)  Therapeutic right S1 TFESI x1 (11/04/2020) (100/100/20/<50)  Diagnostic bilateral superficial trochanteric bursa injec. x1 (04/30/2018) (DKFUA)  Diagnostic right IA hip injection x1 (02/13/2019) (DKFUA)  Diagnostic/therapeutic right IA steroid knee inj. x4 (01/10/2023) (100/100/100/80)  Therapeutic right (Zilretta) knee injection x1 (10/06/2021) (DKFUA)  Therapeutic right (Monovisc) knee injection x1 (06/07/2021) (100/100/100/75)    Therapeutic  Palliative (PRN) options:   Therapeutic/palliative lumbar facet RFA  Therapeutic/palliative lumbar facet MBB  Therapeutic left L2 TFESI (this level is responsible for her low back pain and left lower extremity pain.)   Pharmacotherapy  Nonopioids transfer 12/21/2020: Gabapentin       Recent Visits Date Type Provider Dept  07/17/23 Office Visit [name], MD [facility]  07/03/23 Procedure visit [name], MD [facility]  06/27/23 Office Visit [name], MD [facility]  06/19/23 Office Visit [name], MD [facility]  06/05/23 Procedure visit [name], MD [facility]  05/22/23 Office Visit [name], MD [facility]  Showing recent visits within past 90 days and meeting all other requirements Today's Visits Date Type Provider Dept  07/31/23 Office Visit [name], MD [facility]  Showing today's visits and meeting all other requirements Future Appointments No visits were found meeting these conditions. Showing future appointments within next 90 days and meeting all other requirements   Disposition: Discharge home  Discharge (Date  Time): 07/31/2023; 1355 hrs.   Primary Care Physician: Dale C[place], MD Location: [facility] Note by: Oswaldo Done, MD Date: 07/31/2023; Time: 4:01 PM  DISCLAIMER: Medicine is not an Visual merchandiser. It has no guarantees or warranties. The decision to proceed with this intervention was based on the information collected from the patient. Conclusions were drawn from the patient's questionnaire, interview, and examination. Because information was provided in large part by the patient, it cannot be guaranteed that it has not been purposely or unconsciously manipulated or altered. Every effort has been made to obtain as much accurate, relevant, available data as possible. Always take into account that the treatment will also be dependent on availability of resources and existing treatment guidelines, considered by other Pain Management Specialists as being common knowledge and practice, at the time of the intervention. It is also important to point out that variation in procedural techniques and pharmacological choices are the acceptable norm. For Medico-Legal review purposes, the indications, contraindications, technique, and results of the these  procedures should only be evaluated, judged and interpreted by a Board-Certified Interventional Pain Specialist with extensive familiarity and expertise in the same exact procedure and technique.

## 2023-07-30 NOTE — Telephone Encounter (Signed)
Wanda Martinez is checking to see if she needs approval.  It was ok with Dr Laban Emperor. I will call the patient as soon as I find out.

## 2023-07-30 NOTE — Telephone Encounter (Signed)
Attempted to call patient numerous times and the line is busy.

## 2023-07-30 NOTE — Telephone Encounter (Signed)
PT called wanted to see if she can get a knee injection done tomorrow when she comes in. No order is in placed and I explain to patient. Please give patient a call. TY

## 2023-07-30 NOTE — Progress Notes (Unsigned)
PROVIDER NOTE: Information contained herein reflects review and annotations entered in association with encounter. Interpretation of such information and data should be left to medically-trained personnel. Information provided to patient can be located elsewhere in the medical record under "Patient Instructions". Document created using STT-dictation technology, any transcriptional errors that may result from process are unintentional.    Patient: Wanda Martinez Number  Service Category: E/M  Provider: Oswaldo Done, MD  DOB: Jan 01, 1935  DOS: 07/31/2023  Referring Provider: Dale Sunnyside, MD  MRN: [id number]  Specialty: Interventional Pain Management  PCP: Dale Northfield, MD  Type: Established Patient  Setting: Ambulatory outpatient    Location: Office  Delivery: Face-to-face     HPI  Wanda Martinez, a 88 y.o. year old female, is here today because of her Chronic upper back pain [M54.9, G89.29]. Ms. Rechner primary complain today is No chief complaint on file.  Pertinent problems: [name] has Headache; Parkinson's disease; Chronic low back pain (1ry area of Pain) (Bilateral) (R>L) w/o sciatica; Lumbar spondylosis; Chronic hip pain (Bilateral); Chronic neck pain; Cervical spondylosis; Chronic cervical radicular pain (Right); Diffuse myofascial pain syndrome; Neurogenic pain; Chronic upper back pain (Right); Myofascial pain syndrome (Right) (cervicothoracic); Lumbar facet syndrome (Bilateral) (R>L); History of breast cancer; Cervical facet hypertrophy; Cervical facet syndrome (HCC); Chronic shoulder pain (Right); Chronic pain syndrome; Chronic sacroiliac joint pain (Left); Chronic sacroiliac joint pain (Right); 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, L4-5, L5-S1) (Right); Lumbar spinal stenosis (with neurogenic claudication) (L3-4); Chronic lower extremity pain (Right); Chronic lumbar radicular pain (Right); Trochanteric bursitis of hip (Bilateral); Spondylosis without myelopathy or  radiculopathy, lumbosacral region; Trigger point with back pain (Right); DDD (degenerative disc disease), lumbosacral; Chronic upper extremity pain (Right); Chronic thoracic back pain (Bilateral) (L>R); Trigger point of thoracic region (Bilateral) (L>R); Other specified dorsopathies, sacral and sacrococcygeal region; Sacroiliac joint dysfunction (Right); Osteoarthritis of sacroiliac joint (Right); Somatic dysfunction of sacroiliac joint (Right); Chronic musculoskeletal pain; Facial pain; Chronic hip pain (Right); Osteoarthritis of hip (Right); Left ear pain; 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HCC); Abnormal MRI, lumbar spine (08/23/2022); Osteoarthritis involving multiple joints; Other spondylosis, sacral and sacrococcygeal region; Lumbar facet hypertrophy (Multilevel) (Bilateral); Chronic knee pain (Right); Osteoarthritis of knee (Right); Trigger point of shoulder region (Right); Osteoarthritis of AC (acromioclavicular) joint (Right); Osteoarthritis of shoulder (Right); Unspecified injury of muscle(s) and tendon(s) of the rotator cuff of shoulder, sequela (Right); Cervicalgia; DDD (degenerative disc disease), lumbar; Lumbar radicular pain (Bilateral); Lumbar foraminal stenosis (Right: L3-4, L4-5, L5-S1) (Left: L2-3); Abnormal MRI, hip (Right) (05/04/2022); Abnormal MRI, knee (01/04/2022); Tricompartment osteoarthritis of knee (Right); Chronic low back pain (Right) w/o sciatica; Hoffa's fat pad disease (HCC) (Right); Hoffa's pad syndrome (HCC) (Right); Chronic low back pain (Right) w/ sciatica (Right); Lower extremity weakness (Right); Chronic radicular pain of lower back; Chronic sciatica (Right); Lumbosacral radiculopathy at L5 (Right); and Low back pain of over 3 months duration on their pertinent problem list. Pain Assessment: Severity of   is reported as a  /10. Location:    / . Onset:  . Quality:  . Timing:  . Modifying factor(s):  Marland Kitchen Vitals:  vitals were not taken for this visit.  BMI: Estimated body mass index  is 29.7 kg/m as calculated from the following:   Height as of 07/17/23: 5\' 4"  (1.626 m).   Weight as of 07/17/23: 173 lb (78.5 kg). Last encounter: 07/17/2023. Last procedure: 07/03/2023.  Reason for encounter: post-procedure evaluation and assessment. ***  Post-procedure evaluation   Type: Trapezius/suprascapular Trigger Point Injection (Myoneural Block) (1-2  muscle groups)  #4 (w/ steroids)  CPT: 20552 Laterality: Right (-RT)   Imaging: N/A. Landmark-guided",           Anesthesia: Local anesthesia (1-2% Lidocaine) Anxiolysis: None                 Sedation: No Sedation                       DOS: 07/17/2023  Performed by: Oswaldo Done, MD  Medical Necessity (reasoning)  Purpose: Diagnostic/Therapeutic Rationale (medical necessity): procedure needed and proper for the diagnosis and/or treatment of Ms. Castille's medical symptoms and needs. Indications: Right shoulder/upper back pain severe enough to impact quality of life and/or function. 1. Trigger point of shoulder region (Right)   2. Chronic shoulder pain (Right)   3. Chronic upper back pain (Right)   4. Trigger point with back pain (Right)   5. Trigger point of thoracic region (Bilateral) (L>R)    NAS-11 Pain score:   Pre-procedure: 8 /10   Post-procedure: 8 /10       Effectiveness:  Initial hour after procedure:   ***. Subsequent 4-6 hours post-procedure:   ***. Analgesia past initial 6 hours:   ***. Ongoing improvement:  Analgesic:  *** Function:    ***    ROM:    ***     Pharmacotherapy Assessment  Analgesic: Hydrocodone/APAP 5/325, 1 tab PO q 8 hrs (15 mg/day of hydrocodone) MME/day: 15 mg/day.   Monitoring: [place] PMP: PDMP reviewed during this encounter.       Pharmacotherapy: No side-effects or adverse reactions reported. Compliance: No problems identified. Effectiveness: Clinically acceptable.  No notes on file  No results found for: "CBDTHCR" No results found for: "D8THCCBX" No results found for:  "D9THCCBX"  UDS:  Summary  Date Value Ref Range Status  06/27/2023 Note  Final    Comment:    ==================================================================== [facility] 13 (MW) ==================================================================== Test                             Result       Flag       Units  Drug Present not Declared for Prescription Verification   Oxycodone                      1670         UNEXPECTED ng/mg creat   Noroxycodone                   3226         UNEXPECTED ng/mg creat    Sources of oxycodone include scheduled prescription medications.    Noroxycodone is an expected metabolite of oxycodone.  Drug Absent but Declared for Prescription Verification   Hydrocodone                    Not Detected UNEXPECTED ng/mg creat ==================================================================== Test                      Result    Flag   Units      Ref Range   Creatinine              179              mg/dL      >=16 ==================================================================== Declared Medications:  The flagging and interpretation on this report are based on the  following declared medications.  Unexpected results may arise from  inaccuracies in the declared medications.   **Note: The testing scope of this panel includes these medications:   Hydrocodone (Norco)   **Note: The testing scope of this panel does not include the  following reported medications:   Acetaminophen (Norco)  Amlodipine  Aspirin  Calcium  Chondroitin  Ciprofloxacin (Cipro)  Citalopram (Celexa)  Donepezil (Aricept)  Erythromycin  Gabapentin (Neurontin)  Glucosamine  Hydrocortisone  Letrozole (Femara)  Losartan  Lubiprostone (Amitiza)  Magnesium (Mag-Ox)  Melatonin  Metoprolol  Mirabegron  Multivitamin  Naloxone (Narcan)  Nitrofurantoin (Macrobid)  Omega-3 Fatty Acids  Pantoprazole (Protonix)   Rosuvastatin ==================================================================== For clinical consultation, please call [phone]. ====================================================================       ROS  Constitutional: Denies any fever or chills Gastrointestinal: No reported hemesis, hematochezia, vomiting, or acute GI distress Musculoskeletal: Denies any acute onset joint swelling, redness, loss of ROM, or weakness Neurological: No reported episodes of acute onset apraxia, aphasia, dysarthria, agnosia, amnesia, paralysis, loss of coordination, or loss of consciousness  Medication Review  Glucosamine-Chondroitin, HYDROcodone-acetaminophen, Omega-3, amLODipine, aspirin EC, calcium carbonate, ciprofloxacin, citalopram, cyclobenzaprine, donepezil, erythromycin, gabapentin, hydrocortisone, letrozole, losartan, lubiprostone, magnesium oxide, melatonin, metoprolol succinate, mirabegron ER, multivitamin, naloxone, nitrofurantoin (macrocrystal-monohydrate), oxyCODONE, pantoprazole, and rosuvastatin  History Review  Allergy: Ms. Lindroth is allergic to sulfa antibiotics and vesicare [solifenacin]. Drug: Ms. Portes  reports no history of drug use. Alcohol:  reports no history of alcohol use. Tobacco:  reports that she has never smoked. She has never been exposed to tobacco smoke. She has never used smokeless tobacco. Social: Ms. Pepitone  reports that she has never smoked. She has never been exposed to tobacco smoke. She has never used smokeless tobacco. She reports that she does not drink alcohol and does not use drugs. Medical:  has a past medical history of Acute postoperative pain (10/25/2017), Anemia, Arm pain (07/26/2015), Arthritis, Arthritis, degenerative (03/26/2014), Back pain (11/01/2013), Bladder infection (06/2018), Breast cancer (HCC), Breast cancer (HCC), CHEST PAIN (04/29/2010), Chronic cystitis, Cystocele (02/22/2013), Cystocele, midline (08/19/2013), Degeneration of intervertebral disc  of lumbosacral region (03/26/2014), DYSPNEA (04/29/2010), Enthesopathy of hip (03/26/2014), GERD (gastroesophageal reflux disease), Hiatal hernia, HTN (hypertension), Hypokalemia (06/2018), Hyponatremia (06/2018), LBP (low back pain) (03/26/2014), Neck pain (11/01/2013), Parkinson disease, Pneumonia (06/2018), Sinusitis (02/07/2015), Skin lesions (07/12/2014), and Urinary incontinence. Surgical: Ms. Hoskins  has a past surgical history that includes Tonsillectomy and adenoidectomy (1979); Vesicovaginal fistula closure w/ TAH (1983); Breast surgery (1986); Breast enhancement surgery (1987); Breast implant removal; Breast implant removal (Right, 08/29/2012); Mastectomy (08/2012); Abdominal hysterectomy; Colonoscopy with propofol (N/A, 09/13/2016); Breast biopsy (2013); and Blepharoplasty. Family: family history includes Colon polyps in her father; Diabetes in her father; Parkinson's disease in her mother; Stroke in her father and mother.  Laboratory Chemistry Profile   Renal Lab Results  Component Value Date   BUN 35 (H) 05/15/2023   CREATININE 1.44 (H) 05/15/2023   BCR 20 06/11/2020   GFR 48.46 (L) 04/09/2023   GFRAA >60 07/27/2020   GFRNONAA 36 (L) 05/15/2023    Hepatic Lab Results  Component Value Date   AST 26 04/09/2023   ALT 18 04/09/2023   ALBUMIN 4.1 04/09/2023   ALKPHOS 30 (L) 04/09/2023    Electrolytes Lab Results  Component Value Date   NA 133 (L) 05/15/2023   K 3.5 05/15/2023   CL 95 (L) 05/15/2023   CALCIUM 9.3 05/15/2023   MG 2.2 07/17/2018    Bone Lab Results  Component Value Date   VD25OH 66.95  09/26/2022   25OHVITD1 42 07/17/2018   25OHVITD2 <1.0 07/17/2018   25OHVITD3 42 07/17/2018    Inflammation (CRP: Acute Phase) (ESR: Chronic Phase) Lab Results  Component Value Date   CRP 3 07/17/2018   ESRSEDRATE 27 07/17/2018         Note: Above Lab results reviewed.  Recent Imaging Review  ECHOCARDIOGRAM COMPLETE    ECHOCARDIOGRAM REPORT       Patient Name:   Olivya  LISABELLA PAO Date of Exam: 07/10/2023 Medical Rec #:  [id number]           Height:       64.0 in Accession #:    [id number]          Weight:       173.0 lb Date of Birth:  06-21-1935          BSA:          1.839 m Patient Age:    87 years            BP:           112/62 mmHg Patient Gender: F                   HR:           50 bpm. Exam Location:  [facility]  Procedure: 2D Echo, Cardiac Doppler, Color Doppler and Intracardiac            Opacification Agent  Indications:    I47.1 SVT; R53.83 Fatigue; R60.0 Lower extremity edema; R06.02                 SOB   History:        Patient has no prior history of Echocardiogram examinations.                 Arrythmias:SVT, Signs/Symptoms:Fatigue, Edema and Shortness of                 Breath; Risk Factors:Non-Smoker.   Sonographer:    Quentin Ore RDMS, RVT, RDCS Referring Phys: [id number] BRIAN AGBOR-ETANG  IMPRESSIONS   1. Left ventricular ejection fraction, by estimation, is 55 to 60%. The left ventricle has normal function. The left ventricle has no regional wall motion abnormalities. There is mild left ventricular hypertrophy. Left ventricular diastolic parameters  are indeterminate.  2. Right ventricular systolic function is normal. The right ventricular size is normal.  3. There is no evidence of cardiac tamponade.  4. The mitral valve is normal in structure. No evidence of mitral valve regurgitation. No evidence of mitral stenosis.  5. The aortic valve has an indeterminant number of cusps. Aortic valve regurgitation is not visualized. No aortic stenosis is present.  6. The inferior vena cava is normal in size with greater than 50% respiratory variability, suggesting right atrial pressure of 3 mmHg.  FINDINGS  Left Ventricle: Left ventricular ejection fraction, by estimation, is 55 to 60%. The left ventricle has normal function. The left ventricle has no regional wall motion abnormalities. Definity contrast agent was given IV to delineate  the left ventricular  endocardial borders. The left ventricular internal cavity size was normal in size. There is mild left ventricular hypertrophy. Left ventricular diastolic parameters are indeterminate.  Right Ventricle: The right ventricular size is normal. No increase in right ventricular wall thickness. Right ventricular systolic function is normal.  Left Atrium: Left atrial size was normal in size.  Right Atrium: Right atrial size was normal in size.  Pericardium: Trivial  pericardial effusion is present. The pericardial effusion is circumferential. There is no evidence of cardiac tamponade.  Mitral Valve: The mitral valve is normal in structure. No evidence of mitral valve regurgitation. No evidence of mitral valve stenosis.  Tricuspid Valve: The tricuspid valve is normal in structure. Tricuspid valve regurgitation is not demonstrated. No evidence of tricuspid stenosis.  Aortic Valve: The aortic valve has an indeterminant number of cusps. Aortic valve regurgitation is not visualized. No aortic stenosis is present. Aortic valve mean gradient measures 4.5 mmHg. Aortic valve peak gradient measures 12.0 mmHg. Aortic valve  area, by VTI measures 1.71 cm.  Pulmonic Valve: The pulmonic valve was normal in structure. Pulmonic valve regurgitation is not visualized. No evidence of pulmonic stenosis.  Aorta: The aortic root is normal in size and structure.  Venous: The inferior vena cava is normal in size with greater than 50% respiratory variability, suggesting right atrial pressure of 3 mmHg.  IAS/Shunts: No atrial level shunt detected by color flow Doppler.    LEFT VENTRICLE PLAX 2D LVOT diam:     1.80 cm LV SV:         61 LV SV Index:   33 LVOT Area:     2.54 cm    RIGHT VENTRICLE             IVC RV Basal diam:  3.70 cm     IVC diam: 2.00 cm RV Mid diam:    3.90 cm RV S prime:     13.40 cm/s TAPSE (M-mode): 2.4 cm  LEFT ATRIUM           Index        RIGHT ATRIUM            Index LA diam:      3.90 cm 2.12 cm/m   RA Area:     12.40 cm LA Vol (A4C): 32.7 ml 17.78 ml/m  RA Volume:   28.60 ml  15.55 ml/m  AORTIC VALVE                     PULMONIC VALVE AV Area (Vmax):    1.44 cm      PV Vmax:       0.68 m/s AV Area (Vmean):   1.45 cm      PV Peak grad:  1.9 mmHg AV Area (VTI):     1.71 cm AV Vmax:           173.00 cm/s AV Vmean:          113.000 cm/s AV VTI:            0.358 m AV Peak Grad:      12.0 mmHg AV Mean Grad:      4.5 mmHg LVOT Vmax:         97.85 cm/s LVOT Vmean:        64.200 cm/s LVOT VTI:          0.240 m LVOT/AV VTI ratio: 0.67   AORTA Ao Root diam: 3.00 cm Ao Asc diam:  3.40 cm Ao Arch diam: 3.1 cm  MITRAL VALVE MV Area (PHT): 3.99 cm     SHUNTS MV Decel Time: 190 msec     Systemic VTI:  0.24 m MV E velocity: 119.00 cm/s  Systemic Diam: 1.80 cm MV A velocity: 72.30 cm/s MV E/A ratio:  1.65  Julien Nordmann MD Electronically signed by Julien Nordmann MD Signature Date/Time: 07/10/2023/5:03:33 PM      Final  Note: Reviewed        Physical Exam  General appearance: Well nourished, well developed, and well hydrated. In no apparent acute distress Mental status: Alert, oriented x 3 (person, place, & time)       Respiratory: No evidence of acute respiratory distress Eyes: PERLA Vitals: LMP 12/18/1981  BMI: Estimated body mass index is 29.7 kg/m as calculated from the following:   Height as of 07/17/23: 5\' 4"  (1.626 m).   Weight as of 07/17/23: 173 lb (78.5 kg). Ideal: Ideal body weight: 54.7 kg (120 lb 9.5 oz) Adjusted ideal body weight: 64.2 kg (141 lb 8.9 oz)  Assessment   Diagnosis Status  1. Chronic upper back pain (Right)   2. Trigger point of shoulder region (Right)   3. Chronic shoulder pain (Right)   4. Trigger point with back pain (Right)   5. Trigger point of thoracic region (Bilateral) (L>R)   6. Chronic knee pain (Right)   7. Osteoarthritis of knee (Right)   8. Tricompartment osteoarthritis of knee  (Right)    Controlled Controlled Controlled   Updated Problems: No problems updated.  Plan of Care  Problem-specific:  No problem-specific Assessment & Plan notes found for this encounter.  Ms. Margreat Mccloskey has a current medication list which includes the following long-term medication(s): amlodipine, calcium carbonate, citalopram, cyclobenzaprine, donepezil, gabapentin, hydrocodone-acetaminophen, losartan, magnesium oxide, metoprolol succinate, mirabegron er, oxycodone, [START ON 08/16/2023] oxycodone, [START ON 09/15/2023] oxycodone, pantoprazole, and rosuvastatin.  Pharmacotherapy (Medications Ordered): No orders of the defined types were placed in this encounter.  Orders:  No orders of the defined types were placed in this encounter.  Follow-up plan:   No follow-ups on file.      Interventional Therapies  Risk Factors  Considerations:   Parkinson's disease  Hx right breast cancer  OIC  SOB  CKD Stage 3  GERD  depression  osteopenia/osteoporosis     Planned  Pending:   Therapeutic right L5 #5 and left L2 TFESI #2  Therapeutic/palliative right IA knee Zilretta knee injection #2    Under consideration:   Therapeutic right SI joint injection #4  Diagnostic right genicular NB #1  Diagnostic bilateral cervical facet MBB  Therapeutic right L3 and L4 TFESI #1    Completed:   Therapeutic right superior/lateral trapezius muscle TPI x1 (04/19/2021)  Therapeutic right thoracic back TPI x2 (10/17/2018) (DKFUA)  Therapeutic left thoracic back TPI x1 (10/17/2018) (DKFUA)  Therapeutic right quadratus lumborum and erector spinae muscle TPI x1 (02/12/2018)  Therapeutic bilateral lumbar facet MBB (L2-S1) (FCT: L3-4,L4-5,L5-S1) x5 (08/09/2021) (100/100/50)  Therapeutic bilateral lumbar facet MBB (L4-S1) (FCT: L5-S1) x1 (08/29/2022) (100/100/100 x3 days/R:0L:100)  Therapeutic right lumbar facet MBB (L2-S1) x4 (12/21/2020) (DKFUA)  Therapeutic right lumbar facet RFA (L2-S1)  x3 (09/28/2022) (100/100/75/75)  Therapeutic right lumbar facet RFA (L3-S1) x1 (03/01/2023) (100/100/70/70)  Therapeutic left lumbar facet RFA x2 (11/30/2022) (100/60/90/>75)  Diagnostic right SI joint Blk x3 (12/21/2020)  (DKFUA)  Diagnostic left SI joint Blk x2 (04/12/2023) (100/90/20/<25)  Therapeutic right SI joint RFA x4 (03/01/2023) (100/100/70/70)  Therapeutic midline L2-3 LESI x1 (10/04/2017) (100/100/85/>50)  Therapeutic right L3-4 LESI x2 (11/04/2020) (100/100/20/<50)  Therapeutic right L4-5 LESI x2 (03/30/2022) (100/100/75/75)  Therapeutic right L5-S1 LESI x2 (12/04/2019) (100/100/50/75)  Diagnostic/therapeutic right L3 and L4 TFESI x1 (07/27/2022) (100/100/75/50)  Therapeutic right L5 TFESI x4 (02/14/2022) (100/100/70/75)  Therapeutic right S1 TFESI x1 (11/04/2020) (100/100/20/<50)  Diagnostic bilateral superficial trochanteric bursa injec. x1 (04/30/2018) (DKFUA)  Diagnostic right IA hip injection x1 (02/13/2019) (DKFUA)  Diagnostic/therapeutic right IA steroid knee inj. x4 (01/10/2023) (100/100/100/80)  Therapeutic right (Zilretta) knee injection x1 (10/06/2021) (DKFUA)  Therapeutic right (Monovisc) knee injection x1 (06/07/2021) (100/100/100/75)    Therapeutic  Palliative (PRN) options:   Therapeutic/palliative lumbar facet RFA  Therapeutic/palliative lumbar facet MBB  Therapeutic left L2 TFESI (this level is responsible for her low back pain and left lower extremity pain.)   Pharmacotherapy  Nonopioids transfer 12/21/2020: Gabapentin        Recent Visits Date Type Provider Dept  07/17/23 Office Visit [name], MD [facility]  07/03/23 Procedure visit [name], MD [facility]  06/27/23 Office Visit [name], MD [facility]  06/19/23 Office Visit [name], MD [facility]  06/05/23 Procedure visit [name], MD [facility]  05/22/23 Office Visit [name], MD  [facility]  Showing recent visits within past 90 days and meeting all other requirements Future Appointments Date Type Provider Dept  07/31/23 Appointment [name], MD [facility]  10/10/23 Appointment [name], MD [facility]  Showing future appointments within next 90 days and meeting all other requirements  I discussed the assessment and treatment plan with the patient. The patient was provided an opportunity to ask questions and all were answered. The patient agreed with the plan and demonstrated an understanding of the instructions.  Patient advised to call back or seek an in-person evaluation if the symptoms or condition worsens.  Duration of encounter: *** minutes.  Total time on encounter, as per AMA guidelines included both the face-to-face and non-face-to-face time personally spent by the physician and/or other qualified health care professional(s) on the day of the encounter (includes time in activities that require the physician or other qualified health care professional and does not include time in activities normally performed by clinical staff). Physician's time may include the following activities when performed: Preparing to see the patient (e.g., pre-charting review of records, searching for previously ordered imaging, lab work, and nerve conduction tests) Review of prior analgesic pharmacotherapies. Reviewing PMP Interpreting ordered tests (e.g., lab work, imaging, nerve conduction tests) Performing post-procedure evaluations, including interpretation of diagnostic procedures Obtaining and/or reviewing separately obtained history Performing a medically appropriate examination and/or evaluation Counseling and educating the patient/family/caregiver Ordering medications, tests, or procedures Referring and communicating with other health care professionals (when not separately reported) Documenting clinical information in the  electronic or other health record Independently interpreting results (not separately reported) and communicating results to the patient/ family/caregiver Care coordination (not separately reported)  Note by: Oswaldo Done, MD Date: 07/31/2023; Time: 3:35 PM

## 2023-07-31 ENCOUNTER — Ambulatory Visit: Payer: Medicare HMO | Attending: Pain Medicine | Admitting: Pain Medicine

## 2023-07-31 ENCOUNTER — Encounter: Payer: Self-pay | Admitting: Pain Medicine

## 2023-07-31 VITALS — BP 104/68 | HR 59 | Temp 97.3°F | Resp 14 | Ht 64.0 in | Wt 173.0 lb

## 2023-07-31 DIAGNOSIS — M5137 Other intervertebral disc degeneration, lumbosacral region: Secondary | ICD-10-CM

## 2023-07-31 DIAGNOSIS — M25561 Pain in right knee: Secondary | ICD-10-CM | POA: Diagnosis not present

## 2023-07-31 DIAGNOSIS — G8929 Other chronic pain: Secondary | ICD-10-CM | POA: Insufficient documentation

## 2023-07-31 DIAGNOSIS — R937 Abnormal findings on diagnostic imaging of other parts of musculoskeletal system: Secondary | ICD-10-CM

## 2023-07-31 DIAGNOSIS — M47816 Spondylosis without myelopathy or radiculopathy, lumbar region: Secondary | ICD-10-CM | POA: Diagnosis not present

## 2023-07-31 DIAGNOSIS — Z79891 Long term (current) use of opiate analgesic: Secondary | ICD-10-CM | POA: Diagnosis not present

## 2023-07-31 DIAGNOSIS — M5441 Lumbago with sciatica, right side: Secondary | ICD-10-CM

## 2023-07-31 DIAGNOSIS — Z79899 Other long term (current) drug therapy: Secondary | ICD-10-CM

## 2023-07-31 DIAGNOSIS — M4726 Other spondylosis with radiculopathy, lumbar region: Secondary | ICD-10-CM | POA: Diagnosis not present

## 2023-07-31 DIAGNOSIS — M1711 Unilateral primary osteoarthritis, right knee: Secondary | ICD-10-CM | POA: Diagnosis not present

## 2023-07-31 DIAGNOSIS — Z09 Encounter for follow-up examination after completed treatment for conditions other than malignant neoplasm: Secondary | ICD-10-CM | POA: Insufficient documentation

## 2023-07-31 DIAGNOSIS — M5410 Radiculopathy, site unspecified: Secondary | ICD-10-CM | POA: Diagnosis present

## 2023-07-31 DIAGNOSIS — M25511 Pain in right shoulder: Secondary | ICD-10-CM

## 2023-07-31 DIAGNOSIS — M546 Pain in thoracic spine: Secondary | ICD-10-CM | POA: Diagnosis not present

## 2023-07-31 DIAGNOSIS — M549 Dorsalgia, unspecified: Secondary | ICD-10-CM | POA: Diagnosis not present

## 2023-07-31 DIAGNOSIS — S46001S Unspecified injury of muscle(s) and tendon(s) of the rotator cuff of right shoulder, sequela: Secondary | ICD-10-CM | POA: Diagnosis not present

## 2023-07-31 DIAGNOSIS — G894 Chronic pain syndrome: Secondary | ICD-10-CM

## 2023-07-31 DIAGNOSIS — M79604 Pain in right leg: Secondary | ICD-10-CM

## 2023-07-31 DIAGNOSIS — R399 Unspecified symptoms and signs involving the genitourinary system: Secondary | ICD-10-CM | POA: Diagnosis not present

## 2023-07-31 LAB — POCT URINALYSIS DIPSTICK
Bilirubin, UA: NEGATIVE
Blood, UA: NEGATIVE
Glucose, UA: NEGATIVE
Ketones, UA: NEGATIVE
Leukocytes, UA: NEGATIVE
Nitrite, UA: NEGATIVE
Protein, UA: NEGATIVE
Spec Grav, UA: 1.01 (ref 1.010–1.025)
Urobilinogen, UA: 0.2 E.U./dL
pH, UA: 6 (ref 5.0–8.0)

## 2023-07-31 MED ORDER — TRIAMCINOLONE ACETONIDE 32 MG IX SRER
32.0000 mg | Freq: Once | INTRA_ARTICULAR | Status: AC
  Administered 2023-07-31: 32 mg via INTRA_ARTICULAR
  Filled 2023-07-31: qty 5

## 2023-07-31 MED ORDER — LIDOCAINE HCL (PF) 2 % IJ SOLN
5.0000 mL | Freq: Once | INTRAMUSCULAR | Status: AC
  Administered 2023-07-31: 5 mL
  Filled 2023-07-31: qty 5

## 2023-07-31 MED ORDER — OXYCODONE HCL 5 MG PO TABS
5.0000 mg | ORAL_TABLET | Freq: Three times a day (TID) | ORAL | 0 refills | Status: AC | PRN
Start: 2023-10-15 — End: 2023-11-14

## 2023-07-31 MED ORDER — ROPIVACAINE HCL 2 MG/ML IJ SOLN
3.0000 mL | Freq: Once | INTRAMUSCULAR | Status: AC
  Administered 2023-07-31: 3 mL via INTRA_ARTICULAR
  Filled 2023-07-31: qty 20

## 2023-07-31 MED ORDER — PENTAFLUOROPROP-TETRAFLUOROETH EX AERO
INHALATION_SPRAY | Freq: Once | CUTANEOUS | Status: AC
  Administered 2023-07-31: 30 via TOPICAL

## 2023-07-31 NOTE — Progress Notes (Signed)
Nursing Pain Medication Assessment:  Safety precautions to be maintained throughout the outpatient stay will include: orient to surroundings, keep bed in low position, maintain call bell within reach at all times, provide assistance with transfer out of bed and ambulation.  Medication Inspection Compliance: Pill count conducted under aseptic conditions, in front of the patient. Neither the pills nor the bottle was removed from the patient's sight at any time. Once count was completed pills were immediately returned to the patient in their original bottle.  Medication #1: Oxycodone IR Pill/Patch Count:  57 of 90 pills remain Pill/Patch Appearance: Markings consistent with prescribed medication Bottle Appearance: Standard pharmacy container. Clearly labeled. Filled Date: 07 / 30 / 2024 Last Medication intake:  Today  Medication #2: Hydrocodone/APAP Pill/Patch Count:  50 of 90 pills remain Pill/Patch Appearance: Markings consistent with prescribed medication Bottle Appearance: Standard pharmacy container. Clearly labeled. Filled Date: 07 / 13 / 2024 Last Medication intake:   several days ago

## 2023-07-31 NOTE — Patient Instructions (Addendum)
____________________________________________________________________________________________  Post-Procedure Discharge Instructions  Instructions: Apply ice:  Purpose: This will minimize any swelling and discomfort after procedure.  When: Day of procedure, as soon as you get home. How: Fill a plastic sandwich bag with crushed ice. Cover it with a small towel and apply to injection site. How long: (15 min on, 15 min off) Apply for 15 minutes then remove x 15 minutes.  Repeat sequence on day of procedure, until you go to bed. Apply heat:  Purpose: To treat any soreness and discomfort from the procedure. When: Starting the next day after the procedure. How: Apply heat to procedure site starting the day following the procedure. How long: May continue to repeat daily, until discomfort goes away. Food intake: Start with clear liquids (like water) and advance to regular food, as tolerated.  Physical activities: Keep activities to a minimum for the first 8 hours after the procedure. After that, then as tolerated. Driving: If you have received any sedation, be responsible and do not drive. You are not allowed to drive for 24 hours after having sedation. Blood thinner: (Applies only to those taking blood thinners) You may restart your blood thinner 6 hours after your procedure. Insulin: (Applies only to Diabetic patients taking insulin) As soon as you can eat, you may resume your normal dosing schedule. Infection prevention: Keep procedure site clean and dry. Shower daily and clean area with soap and water. Post-procedure Pain Diary: Extremely important that this be done correctly and accurately. Recorded information will be used to determine the next step in treatment. For the purpose of accuracy, follow these rules: Evaluate only the area treated. Do not report or include pain from an untreated area. For the purpose of this evaluation, ignore all other areas of pain, except for the treated  area. After your procedure, avoid taking a long nap and attempting to complete the pain diary after you wake up. Instead, set your alarm clock to go off every hour, on the hour, for the initial 8 hours after the procedure. Document the duration of the numbing medicine, and the relief you are getting from it. Do not go to sleep and attempt to complete it later. It will not be accurate. If you received sedation, it is likely that you were given a medication that may cause amnesia. Because of this, completing the diary at a later time may cause the information to be inaccurate. This information is needed to plan your care. Follow-up appointment: Keep your post-procedure follow-up evaluation appointment after the procedure (usually 2 weeks for most procedures, 6 weeks for radiofrequencies). DO NOT FORGET to bring you pain diary with you.   Expect: (What should I expect to see with my procedure?) From numbing medicine (AKA: Local Anesthetics): Numbness or decrease in pain. You may also experience some weakness, which if present, could last for the duration of the local anesthetic. Onset: Full effect within 15 minutes of injected. Duration: It will depend on the type of local anesthetic used. On the average, 1 to 8 hours.  From steroids (Applies only if steroids were used): Decrease in swelling or inflammation. Once inflammation is improved, relief of the pain will follow. Onset of benefits: Depends on the amount of swelling present. The more swelling, the longer it will take for the benefits to be seen. In some cases, up to 10 days. Duration: Steroids will stay in the system x 2 weeks. Duration of benefits will depend on multiple posibilities including persistent irritating factors. Side-effects: If present, they  may typically last 2 weeks (the duration of the steroids). Frequent: Cramps (if they occur, drink Gatorade and take over-the-counter Magnesium 450-500 mg once to twice a day); water retention with  temporary weight gain; increases in blood sugar; decreased immune system response; increased appetite. Occasional: Facial flushing (red, warm cheeks); mood swings; menstrual changes. Uncommon: Long-term decrease or suppression of natural hormones; bone thinning. (These are more common with higher doses or more frequent use. This is why we prefer that our patients avoid having any injection therapies in other practices.)  Very Rare: Severe mood changes; psychosis; aseptic necrosis. From procedure: Some discomfort is to be expected once the numbing medicine wears off. This should be minimal if ice and heat are applied as instructed.  Call if: (When should I call?) You experience numbness and weakness that gets worse with time, as opposed to wearing off. New onset bowel or bladder incontinence. (Applies only to procedures done in the spine)  Emergency Numbers: Durning business hours (Monday - Thursday, 8:00 AM - 4:00 PM) (Friday, 9:00 AM - 12:00 Noon): [phone] After hours: [phone] NOTE: If you are having a problem and are unable connect with, or to talk to a provider, then go to your nearest urgent care or emergency department. If the problem is serious and urgent, please call 911. ____________________________________________________________________________________________    ____________________________________________________________________________________________  Opioid Pain Medication Update  To: All patients taking opioid pain medications. (I.e.: hydrocodone, hydromorphone, oxycodone, oxymorphone, morphine, codeine, methadone, tapentadol, tramadol, buprenorphine, fentanyl, etc.)  Re: Updated review of side effects and adverse reactions of opioid analgesics, as well as new information about long term effects of this class of medications.  Direct risks of long-term opioid therapy are not limited to opioid addiction and overdose. Potential medical risks include serious  fractures, breathing problems during sleep, hyperalgesia, immunosuppression, chronic constipation, bowel obstruction, myocardial infarction, and tooth decay secondary to xerostomia.  Unpredictable adverse effects that can occur even if you take your medication correctly: Cognitive impairment, respiratory depression, and death. Most people think that if they take their medication "correctly", and "as instructed", that they will be safe. Nothing could be farther from the truth. In reality, a significant amount of recorded deaths associated with the use of opioids has occurred in individuals that had taken the medication for a long time, and were taking their medication correctly. The following are examples of how this can happen: Patient taking his/her medication for a long time, as instructed, without any side effects, is given a certain antibiotic or another unrelated medication, which in turn triggers a "Drug-to-drug interaction" leading to disorientation, cognitive impairment, impaired reflexes, respiratory depression or an untoward event leading to serious bodily harm or injury, including death.  Patient taking his/her medication for a long time, as instructed, without any side effects, develops an acute impairment of liver and/or kidney function. This will lead to a rapid inability of the body to breakdown and eliminate their pain medication, which will result in effects similar to an "overdose", but with the same medicine and dose that they had always taken. This again may lead to disorientation, cognitive impairment, impaired reflexes, respiratory depression or an untoward event leading to serious bodily harm or injury, including death.  A similar problem will occur with patients as they grow older and their liver and kidney function begins to decrease as part of the aging process.  Background information: Historically, the original case for using long-term opioid therapy to treat chronic noncancer  pain was based on safety  assumptions that subsequent experience has called into question. In 1996, the American Pain Society and the American Academy of Pain Medicine issued a consensus statement supporting long-term opioid therapy. This statement acknowledged the dangers of opioid prescribing but concluded that the risk for addiction was low; respiratory depression induced by opioids was short-lived, occurred mainly in opioid-naive patients, and was antagonized by pain; tolerance was not a common problem; and efforts to control diversion should not constrain opioid prescribing. This has now proven to be wrong. Experience regarding the risks for opioid addiction, misuse, and overdose in community practice has failed to support these assumptions.  According to the Centers for Disease Control and Prevention, fatal overdoses involving opioid analgesics have increased sharply over the past decade. Currently, more than 96,700 people die from drug overdoses every year. Opioids are a factor in 7 out of every 10 overdose deaths. Deaths from drug overdose have surpassed motor vehicle accidents as the leading cause of death for individuals between the ages of 74 and 45.  Clinical data suggest that neuroendocrine dysfunction may be very common in both men and women, potentially causing hypogonadism, erectile dysfunction, infertility, decreased libido, osteoporosis, and depression. Recent studies linked higher opioid dose to increased opioid-related mortality. Controlled observational studies reported that long-term opioid therapy may be associated with increased risk for cardiovascular events. Subsequent meta-analysis concluded that the safety of long-term opioid therapy in elderly patients has not been proven.   Side Effects and adverse reactions: Common side effects: Drowsiness (sedation). Dizziness. Nausea and vomiting. Constipation. Physical dependence -- Dependence often manifests with withdrawal symptoms when  opioids are discontinued or decreased. Tolerance -- As you take repeated doses of opioids, you require increased medication to experience the same effect of pain relief. Respiratory depression -- This can occur in healthy people, especially with higher doses. However, people with COPD, asthma or other lung conditions may be even more susceptible to fatal respiratory impairment.  Uncommon side effects: An increased sensitivity to feeling pain and extreme response to pain (hyperalgesia). Chronic use of opioids can lead to this. Delayed gastric emptying (the process by which the contents of your stomach are moved into your small intestine). Muscle rigidity. Immune system and hormonal dysfunction. Quick, involuntary muscle jerks (myoclonus). Arrhythmia. Itchy skin (pruritus). Dry mouth (xerostomia).  Long-term side effects: Chronic constipation. Sleep-disordered breathing (SDB). Increased risk of bone fractures. Hypothalamic-pituitary-adrenal dysregulation. Increased risk of overdose.  RISKS: Respiratory depression and death: Opioids increase the risk of respiratory depression and death.  Drug-to-drug interactions: Opioids are relatively contraindicated in combination with benzodiazepines, sleep inducers, and other central nervous system depressants. Other classes of medications (i.e.: certain antibiotics and even over-the-counter medications) may also trigger or induce respiratory depression in some patients.  Medical conditions: Patients with pre-existing respiratory problems are at higher risk of respiratory failure and/or depression when in combination with opioid analgesics. Opioids are relatively contraindicated in some medical conditions such as central sleep apnea.   Fractures and Falls:  Opioids increase the risk and incidence of falls. This is of particular importance in elderly patients.  Endocrine System:  Long-term administration is associated with endocrine abnormalities  (endocrinopathies). (Also known as Opioid-induced Endocrinopathy) Influences on both the hypothalamic-pituitary-adrenal axis?and the hypothalamic-pituitary-gonadal axis have been demonstrated with consequent hypogonadism and adrenal insufficiency in both sexes. Hypogonadism and decreased levels of dehydroepiandrosterone sulfate have been reported in men and women. Endocrine effects include: Amenorrhoea in women (abnormal absence of menstruation) Reduced libido in both sexes Decreased sexual function Erectile dysfunction in men Hypogonadisms (  decreased testicular function with shrinkage of testicles) Infertility Depression and fatigue Loss of muscle mass Anxiety Depression Immune suppression Hyperalgesia Weight gain Anemia Osteoporosis Patients (particularly women of childbearing age) should avoid opioids. There is insufficient evidence to recommend routine monitoring of asymptomatic patients taking opioids in the long-term for hormonal deficiencies.  Immune System: Human studies have demonstrated that opioids have an immunomodulating effect. These effects are mediated via opioid receptors both on immune effector cells and in the central nervous system. Opioids have been demonstrated to have adverse effects on antimicrobial response and anti-tumour surveillance. Buprenorphine has been demonstrated to have no impact on immune function.  Opioid Induced Hyperalgesia: Human studies have demonstrated that prolonged use of opioids can lead to a state of abnormal pain sensitivity, sometimes called opioid induced hyperalgesia (OIH). Opioid induced hyperalgesia is not usually seen in the absence of tolerance to opioid analgesia. Clinically, hyperalgesia may be diagnosed if the patient on long-term opioid therapy presents with increased pain. This might be qualitatively and anatomically distinct from pain related to disease progression or to breakthrough pain resulting from development of opioid  tolerance. Pain associated with hyperalgesia tends to be more diffuse than the pre-existing pain and less defined in quality. Management of opioid induced hyperalgesia requires opioid dose reduction.  Cancer: Chronic opioid therapy has been associated with an increased risk of cancer among noncancer patients with chronic pain. This association was more evident in chronic strong opioid users. Chronic opioid consumption causes significant pathological changes in the small intestine and colon. Epidemiological studies have found that there is a link between opium dependence and initiation of gastrointestinal cancers. Cancer is the second leading cause of death after cardiovascular disease. Chronic use of opioids can cause multiple conditions such as GERD, immunosuppression and renal damage as well as carcinogenic effects, which are associated with the incidence of cancers.   Mortality: Long-term opioid use has been associated with increased mortality among patients with chronic non-cancer pain (CNCP).  Prescription of long-acting opioids for chronic noncancer pain was associated with a significantly increased risk of all-cause mortality, including deaths from causes other than overdose.  Reference: Von Korff M, Kolodny A, Deyo RA, Chou R. Long-term opioid therapy reconsidered. Ann Intern Med. 2011 Sep 6;155(5):325-8. doi: 10.7326/[id number]. PMID: 40981191; PMCID: [id number]. Randon Goldsmith, Hayward RA, Dunn KM, [place] KP. Risk of adverse events in patients prescribed long-term opioids: A cohort study in the [place] [facility] Research Datalink. Eur J Pain. 2019 May;23(5):908-922. doi: 10.1002/ejp.1357. Epub 2019 Jan 31. PMID: 08657846. Colameco S, Coren JS, Ciervo CA. Continuous opioid treatment for chronic noncancer pain: a time for moderation in prescribing. Postgrad Med. 2009 Jul;121(4):61-6. doi: 10.3810/pgm.2009.07.2032. PMID: 96295284. William Hamburger RN, Gladstone SD, Blazina I, Cristopher Peru, Bougatsos C, Deyo RA. The effectiveness and risks of long-term opioid therapy for chronic pain: a systematic review for a [employer]ways to [employer]. Ann Intern Med. 2015 Feb 17;162(4):276-86. doi: 10.7326/M14-2559. PMID: 13244010. Caryl Bis [facility], [place] DM. NCHS Data Brief No. 22. [place]: Centers for Disease Control and Prevention; 2009. Sep, Increase in Fatal Poisonings Involving Opioid Analgesics in the [place], 1999-2006. Song IA, Choi HR, Oh TK. Long-term opioid use and mortality in patients with chronic non-cancer pain: Ten-year follow-up study in [place] from 2010 through 2019. EClinicalMedicine. 2022 Jul 18;51:101558. doi: 10.1016/j.eclinm.2022.272536. PMID: 64403474; PMCID: [id number]. Huser, WChristean Leaf, T., Vogelmann, T. et al. All-cause mortality in patients with long-term opioid therapy compared with  non-opioid analgesics for chronic non-cancer pain: a database study. BMC Med 18, 162 (2020). http://lester.info/ Rashidian H, Karie Kirks, Malekzadeh R, Haghdoost AA. An Ecological Study of the Association between Opiate Use and Incidence of Cancers. Addict Health. 2016 Fall;8(4):252-260. PMID: 19147829; PMCID: [id number].  Our Goal: Our goal is to control your pain with means other than the use of opioid pain medications.  Our Recommendation: Talk to your physician about coming off of these medications. We can assist you with the tapering down and stopping these medicines. Based on the new information, even if you cannot completely stop the medication, a decrease in the dose may be associated with a lesser risk. Ask for other means of controlling the pain. Decrease or eliminate those factors that significantly contribute to your pain such as smoking, obesity, and a diet heavily tilted towards "inflammatory" nutrients.  Last Updated: 06/25/2023    ____________________________________________________________________________________________     ____________________________________________________________________________________________  National Pain Medication Shortage  The [place] is experiencing worsening drug shortages. These have had a negative widespread effect on patient care and treatment. Not expected to improve any time soon. Predicted to last past 2029.   Drug shortage list (generic names) Oxycodone IR Oxycodone/APAP Oxymorphone IR Hydromorphone Hydrocodone/APAP Morphine  Where is the problem?  Manufacturing and supply level.  Will this shortage affect you?  Only if you take any of the above pain medications.  How? You may be unable to fill your prescription.  Your pharmacist may offer a "partial fill" of your prescription. (Warning: Do not accept partial fills.) Prescriptions partially filled cannot be transferred to another pharmacy. Read our Medication Rules and Regulation. Depending on how much medicine you are dependent on, you may experience withdrawals when unable to get the medication.  Recommendations: Consider ending your dependence on opioid pain medications. Ask your pain specialist to assist you with the process. Consider switching to a medication currently not in shortage, such as Buprenorphine. Talk to your pain specialist about this option. Consider decreasing your pain medication requirements by managing tolerance thru "Drug Holidays". This may help minimize withdrawals, should you run out of medicine. Control your pain thru the use of non-pharmacological interventional therapies.   Your prescriber: Prescribers cannot be blamed for shortages. Medication manufacturing and supply issues cannot be fixed by the prescriber.   NOTE: The prescriber is not responsible for supplying the medication, or solving supply issues. Work with your pharmacist to solve it. The patient is responsible for the decision  to take or continue taking the medication and for identifying and securing a legal supply source. By law, supplying the medication is the job and responsibility of the pharmacy. The prescriber is responsible for the evaluation, monitoring, and prescribing of these medications.   Prescribers will NOT: Re-issue prescriptions that have been partially filled. Re-issue prescriptions already sent to a pharmacy.  Re-send prescriptions to a different pharmacy because yours did not have your medication. Ask pharmacist to order more medicine or transfer the prescription to another pharmacy. (Read below.)  New 2023 regulation: "August 18, 2022 Revised Regulation Allows DEA-Registered Pharmacies to Transfer Electronic Prescriptions at a Patient's Request DEA Headquarters Division - Public Information Office Patients now have the ability to request their electronic prescription be transferred to another pharmacy without having to go back to their practitioner to initiate the request. This revised regulation went into effect on Monday, August 14, 2022.     At a patient's request, a DEA-registered retail pharmacy can now transfer an electronic prescription for a  controlled substance (schedules II-V) to another DEA-registered retail pharmacy. Prior to this change, patients would have to go through their practitioner to cancel their prescription and have it re-issued to a different pharmacy. The process was taxing and time consuming for both patients and practitioners.    The Drug Enforcement Administration [facility]) published its intent to revise the process for transferring electronic prescriptions on November 05, 2020.  The final rule was published in the federal register on July 13, 2022 and went into effect 30 days later.  Under the final rule, a prescription can only be transferred once between pharmacies, and only if allowed under existing state or other applicable law. The prescription must remain in its  electronic form; may not be altered in any way; and the transfer must be communicated directly between two licensed pharmacists. It's important to note, any authorized refills transfer with the original prescription, which means the entire prescription will be filled at the same pharmacy".  Reference: [email or web address] [facility] website announcement)  [email or web address] [employer] of [employer])   [employer] / Vol. 88, No. 143 / Thursday, July 13, 2022 / Rules and Regulations DEPARTMENT OF JUSTICE  Drug Enforcement Administration  21 CFR Part 1306  [Docket No. [id number]]  RIN [id number] Transfer of Electronic Prescriptions for Schedules II-V Controlled Substances Between Pharmacies for Initial Filling  ____________________________________________________________________________________________     ____________________________________________________________________________________________  Transfer of Pain Medication between Pharmacies  Re: 2023 DEA Clarification on existing regulation  Published on DEA Website: August 18, 2022  Title: Revised Regulation Allows DEA-Registered Pharmacies to Electrical engineer Prescriptions at a Patient's Request DEA Headquarters Division - [employer]  "Patients now have the ability to request their electronic prescription be transferred to another pharmacy without having to go back to their practitioner to initiate the request. This revised regulation went into effect on Monday, August 14, 2022.     At a patient's request, a DEA-registered retail pharmacy can now transfer an electronic prescription for a controlled substance (schedules II-V) to another DEA-registered retail pharmacy. Prior to this change, patients would have to go through their practitioner to cancel their  prescription and have it re-issued to a different pharmacy. The process was taxing and time consuming for both patients and practitioners.    The Drug Enforcement Administration [facility]) published its intent to revise the process for transferring electronic prescriptions on November 05, 2020.  The final rule was published in the federal register on July 13, 2022 and went into effect 30 days later.  Under the final rule, a prescription can only be transferred once between pharmacies, and only if allowed under existing state or other applicable law. The prescription must remain in its electronic form; may not be altered in any way; and the transfer must be communicated directly between two licensed pharmacists. It's important to note, any authorized refills transfer with the original prescription, which means the entire prescription will be filled at the same pharmacy."    REFERENCES: 1. DEA website announcement [email or web address]  2. Department of Justice website  [email or web address]  3. DEPARTMENT OF JUSTICE Drug Enforcement Administration 21 CFR Part 1306 [Docket No. [id number]] RIN [id number] "Transfer of Electronic Prescriptions for Schedules II-V Controlled Substances Between Pharmacies for Initial Filling"  ____________________________________________________________________________________________     _______________________________________________________________________  Medication Rules  Purpose: To inform patients, and their family members, of our medication rules and regulations.  Applies to: All patients receiving prescriptions from our practice (written or electronic).  Pharmacy of record: This is the pharmacy  where your electronic prescriptions will be sent. Make sure we have the correct one.  Electronic prescriptions: In compliance  with the [facility] Strengthen Opioid Misuse Prevention (STOP) Act of 2017 (Session Conni Elliot [phone]), effective December 18, 2018, all controlled substances must be electronically prescribed. Written prescriptions, faxing, or calling prescriptions to a pharmacy will no longer be done.  Prescription refills: These will be provided only during in-person appointments. No medications will be renewed without a "face-to-face" evaluation with your provider. Applies to all prescriptions.  NOTE: The following applies primarily to controlled substances (Opioid* Pain Medications).   Type of encounter (visit): For patients receiving controlled substances, face-to-face visits are required. (Not an option and not up to the patient.)  Patient's responsibilities: Pain Pills: Bring all pain pills to every appointment (except for procedure appointments). Pill Bottles: Bring pills in original pharmacy bottle. Bring bottle, even if empty. Always bring the bottle of the most recent fill.  Medication refills: You are responsible for knowing and keeping track of what medications you are taking and when is it that you will need a refill. The day before your appointment: write a list of all prescriptions that need to be refilled. The day of the appointment: give the list to the admitting nurse. Prescriptions will be written only during appointments. No prescriptions will be written on procedure days. If you forget a medication: it will not be "Called in", "Faxed", or "electronically sent". You will need to get another appointment to get these prescribed. No early refills. Do not call asking to have your prescription filled early. Partial  or short prescriptions: Occasionally your pharmacy may not have enough pills to fill your prescription.  NEVER ACCEPT a partial fill or a prescription that is short of the total amount of pills that you were prescribed.  With controlled substances the law allows 72 hours for the pharmacy  to complete the prescription.  If the prescription is not completed within 72 hours, the pharmacist will require a new prescription to be written. This means that you will be short on your medicine and we WILL NOT send another prescription to complete your original prescription.  Instead, request the pharmacy to send a carrier to a nearby branch to get enough medication to provide you with your full prescription. Prescription Accuracy: You are responsible for carefully inspecting your prescriptions before leaving our office. Have the discharge nurse carefully go over each prescription with you, before taking them home. Make sure that your name is accurately spelled, that your address is correct. Check the name and dose of your medication to make sure it is accurate. Check the number of pills, and the written instructions to make sure they are clear and accurate. Make sure that you are given enough medication to last until your next medication refill appointment. Taking Medication: Take medication as prescribed. When it comes to controlled substances, taking less pills or less frequently than prescribed is permitted and encouraged. Never take more pills than instructed. Never take the medication more frequently than prescribed.  Inform other Doctors: Always inform, all of your healthcare providers, of all the medications you take. Pain Medication from other Providers: You are not allowed to accept any additional pain medication from any other Doctor or Healthcare provider. There are two exceptions to this rule. (see below) In the event that you require additional pain medication, you are responsible for notifying us, as stated below. Cough Medicine: Often these contain an opioid, such as codeine or hydrocodone. Never accept or  take cough medicine containing these opioids if you are already taking an opioid* medication. The combination may cause respiratory failure and death. Medication Agreement: You are  responsible for carefully reading and following our Medication Agreement. This must be signed before receiving any prescriptions from our practice. Safely store a copy of your signed Agreement. Violations to the Agreement will result in no further prescriptions. (Additional copies of our Medication Agreement are available upon request.) Laws, Rules, & Regulations: All patients are expected to follow all [address] and [employer], Rules, [place] [employer] [place] [employer]. Ignorance of the Laws does not constitute a valid excuse.  Illegal drugs and Controlled Substances: The use of illegal substances (including, but not limited to marijuana and its derivatives) and/or the illegal use of any controlled substances is strictly prohibited. Violation of this rule may result in the immediate and permanent discontinuation of any and all prescriptions being written by our practice. The use of any illegal substances is prohibited. Adopted CDC guidelines & recommendations: Target dosing levels will be at or below 60 MME/day. Use of benzodiazepines** is not recommended.  Exceptions: There are only two exceptions to the rule of not receiving pain medications from other Healthcare Providers. Exception #1 (Emergencies): In the event of an emergency (i.e.: accident requiring emergency care), you are allowed to receive additional pain medication. However, you are responsible for: As soon as you are able, call our office [phone], at any time of the day or night, and leave a message stating your name, the date and nature of the emergency, and the name and dose of the medication prescribed. In the event that your call is answered by a member of our staff, make sure to document and save the date, time, and the name of the person that took your information.  Exception #2 (Planned Surgery): In the event that you are scheduled by another doctor or dentist to have any type of surgery or procedure, you are allowed (for a period no longer  than 30 days), to receive additional pain medication, for the acute post-op pain. However, in this case, you are responsible for picking up a copy of our "Post-op Pain Management for Surgeons" handout, and giving it to your surgeon or dentist. This document is available at our office, and does not require an appointment to obtain it. Simply go to our office during business hours (Monday-Thursday from 8:00 AM to 4:00 PM) (Friday 8:00 AM to 12:00 Noon) or if you have a scheduled appointment with [place], prior to your surgery, and ask for it by name. In addition, you are responsible for: calling our office [phone], at any time of the day or night, and leaving a message stating your name, name of your surgeon, type of surgery, and date of procedure or surgery. Failure to comply with your responsibilities may result in termination of therapy involving the controlled substances. Medication Agreement Violation. Following the above rules, including your responsibilities will help you in avoiding a Medication Agreement Violation ("Breaking your Pain Medication Contract").  Consequences:  Not following the above rules may result in permanent discontinuation of medication prescription therapy.  *Opioid medications include: morphine, codeine, oxycodone, oxymorphone, hydrocodone, hydromorphone, meperidine, tramadol, tapentadol, buprenorphine, fentanyl, methadone. **Benzodiazepine medications include: diazepam (Valium), alprazolam (Xanax), clonazepam (Klonopine), lorazepam (Ativan), clorazepate (Tranxene), chlordiazepoxide (Librium), estazolam (Prosom), oxazepam (Serax), temazepam (Restoril), triazolam (Halcion) (Last updated: 10/10/2022) ______________________________________________________________________    ______________________________________________________________________  Medication Recommendations and Reminders  Applies to: All patients receiving prescriptions (written and/or  electronic).  Medication Rules &  Regulations: You are responsible for reading, knowing, and following our "Medication Rules" document. These exist for your safety and that of others. They are not flexible and neither are we. Dismissing or ignoring them is an act of "non-compliance" that may result in complete and irreversible termination of such medication therapy. For safety reasons, "non-compliance" will not be tolerated. As with the [place] fundamental legal principle of "ignorance of the law is no defense", we will accept no excuses for not having read and knowing the content of documents provided to you by our practice.  Pharmacy of record:  Definition: This is the pharmacy where your electronic prescriptions will be sent.  We do not endorse any particular pharmacy. It is up to you and your insurance to decide what pharmacy to use.  We do not restrict you in your choice of pharmacy. However, once we write for your prescriptions, we will NOT be re-sending more prescriptions to fix restricted supply problems created by your pharmacy, or your insurance.  The pharmacy listed in the electronic medical record should be the one where you want electronic prescriptions to be sent. If you choose to change pharmacy, simply notify our nursing staff. Changes will be made only during your regular appointments and not over the phone.  Recommendations: Keep all of your pain medications in a safe place, under lock and key, even if you live alone. We will NOT replace lost, stolen, or damaged medication. We do not accept "Police Reports" as proof of medications having been stolen. After you fill your prescription, take 1 week's worth of pills and put them away in a safe place. You should keep a separate, properly labeled bottle for this purpose. The remainder should be kept in the original bottle. Use this as your primary supply, until it runs out. Once it's gone, then you know that you have 1 week's worth of medicine,  and it is time to come in for a prescription refill. If you do this correctly, it is unlikely that you will ever run out of medicine. To make sure that the above recommendation works, it is very important that you make sure your medication refill appointments are scheduled at least 1 week before you run out of medicine. To do this in an effective manner, make sure that you do not leave the office without scheduling your next medication management appointment. Always ask the nursing staff to show you in your prescription , when your medication will be running out. Then arrange for the receptionist to get you a return appointment, at least 7 days before you run out of medicine. Do not wait until you have 1 or 2 pills left, to come in. This is very poor planning and does not take into consideration that we may need to cancel appointments due to bad weather, sickness, or emergencies affecting our staff. DO NOT ACCEPT A "Partial Fill": If for any reason your pharmacy does not have enough pills/tablets to completely fill or refill your prescription, do not allow for a "partial fill". The law allows the pharmacy to complete that prescription within 72 hours, without requiring a new prescription. If they do not fill the rest of your prescription within those 72 hours, you will need a separate prescription to fill the remaining amount, which we will NOT provide. If the reason for the partial fill is your insurance, you will need to talk to the pharmacist about payment alternatives for the remaining tablets, but again, DO NOT ACCEPT A PARTIAL FILL, unless  you can trust your pharmacist to obtain the remainder of the pills within 72 hours.  Prescription refills and/or changes in medication(s):  Prescription refills, and/or changes in dose or medication, will be conducted only during scheduled medication management appointments. (Applies to both, written and electronic prescriptions.) No refills on procedure days. No  medication will be changed or started on procedure days. No changes, adjustments, and/or refills will be conducted on a procedure day. Doing so will interfere with the diagnostic portion of the procedure. No phone refills. No medications will be "called into the pharmacy". No Fax refills. No weekend refills. No Holliday refills. No after hours refills.  Remember:  Business hours are:  Monday to Thursday 8:00 AM to 4:00 PM Provider's Schedule: [name], MD - Appointments are:  Medication management: Monday and Wednesday 8:00 AM to 4:00 PM Procedure day: Tuesday and Thursday 7:30 AM to 4:00 PM Edward Jolly, MD - Appointments are:  Medication management: Tuesday and Thursday 8:00 AM to 4:00 PM Procedure day: Monday and Wednesday 7:30 AM to 4:00 PM (Last update: 10/10/2022) ______________________________________________________________________    ____________________________________________________________________________________________  Drug Holidays  What is a "Drug Holiday"? Drug Holiday: is the name given to the process of slowly tapering down and temporarily stopping the pain medication for the purpose of decreasing or eliminating tolerance to the drug.  Benefits Improved effectiveness Decreased required effective dose Improved pain control End dependence on high dose therapy Decrease cost of therapy Uncovering "opioid-induced hyperalgesia". (OIH)  What is "opioid hyperalgesia"? It is a paradoxical increase in pain caused by exposure to opioids. Stopping the opioid pain medication, contrary to the expected, it actually decreases or completely eliminates the pain. Ref.: "A comprehensive review of opioid-induced hyperalgesia". Donney Rankins, et.al. Pain Physician. 2011 Mar-Apr;14(2):145-61.  What is tolerance? Tolerance: the progressive loss of effectiveness of a pain medicine due to repetitive use. A common problem of opioid pain medications.  How long should a "Drug  Holiday" last? Effectiveness depends on the patient staying off all opioid pain medicines for a minimum of 14 consecutive days. (2 weeks)  How about just taking less of the medicine? Does not work. Will not accomplish goal of eliminating the excess receptors.  How about switching to a different pain medicine? (AKA. "Opioid rotation") Does not work. Creates the illusion of effectiveness by taking advantage of inaccurate equivalent dose calculations between different opioids. -This "technique" was promoted by studies funded by [employer], such as [employer], creators of "OxyContin".  Can I stop the medicine "cold [place]"? We do not recommend it. You should always coordinate with your prescribing physician to make the transition as smoothly as possible. Avoid stopping the medicine abruptly without consulting. We recommend a "slow taper".  What is a slow taper? Taper: refers to the gradual decrease in dose.   How do I stop/taper the dose? Slowly. Decrease the daily amount of pills that you take by one (1) pill every seven (7) days. This is called a "slow downward taper". Example: if you normally take four (4) pills per day, drop it to three (3) pills per day for seven (7) days, then to two (2) pills per day for seven (7) days, then to one (1) per day for seven (7) days, and then stop the medicine. The 14 day "Drug Holiday" starts on the first day without medicine.   Will I experience withdrawals? Unlikely with a slow taper.  What triggers withdrawals? Withdrawals are triggered by the sudden/abrupt stop of high dose opioids. Withdrawals can  be avoided by slowly decreasing the dose over a prolonged period of time.  What are withdrawals? Symptoms associated with sudden/abrupt reduction/stopping of high-dose, long-term use of pain medication. Withdrawal are seldom seen on low dose therapy, or patients rarely taking opioid medication.  Early Withdrawal Symptoms may  include: Agitation Anxiety Muscle aches Increased tearing Insomnia Runny nose Sweating Yawning  Late symptoms may include: Abdominal cramping Diarrhea Dilated pupils Goose bumps Nausea Vomiting  When could I see withdrawals? Onset: 8-24 hours after last use for most opioids. 12-48 hours for long-acting opioids (i.e.: methadone)  How long could they last? Duration: 4-10 days for most opioids. 14-21 days for long-acting opioids (i.e.: methadone)  What will happen after I complete my "Drug Holiday"? The need and indications for the opioid analgesic will be reviewed before restarting the medication. Dose requirements will likely decrease and the dose will need to be adjusted accordingly.   (Last update: 03/07/2023) ____________________________________________________________________________________________   ____________________________________________________________________________________________  Naloxone Nasal Spray  Why am I receiving this medication? [place] STOP ACT requires that all patients taking high dose opioids or at risk of opioids respiratory depression, be prescribed an opioid reversal agent, such as Naloxone (AKA: Narcan).  What is this medication? NALOXONE (nal OX one) treats opioid overdose, which causes slow or shallow breathing, severe drowsiness, or trouble staying awake. Call emergency services after using this medication. You may need additional treatment. Naloxone works by reversing the effects of opioids. It belongs to a group of medications called opioid blockers.  COMMON BRAND NAME(S): Kloxxado, Narcan  What should I tell my care team before I take this medication? They need to know if you have any of these conditions: Heart disease Substance use disorder An unusual or allergic reaction to naloxone, other medications, foods, dyes, or preservatives Pregnant or trying to get pregnant Breast-feeding  When to use this medication? This  medication is to be used for the treatment of respiratory depression (less than 8 breaths per minute) secondary to opioid overdose.   How to use this medication? This medication is for use in the nose. Lay the person on their back. Support their neck with your hand and allow the head to tilt back before giving the medication. The nasal spray should be given into 1 nostril. After giving the medication, move the person onto their side. Do not remove or test the nasal spray until ready to use. Get emergency medical help right away after giving the first dose of this medication, even if the person wakes up. You should be familiar with how to recognize the signs and symptoms of a narcotic overdose. If more doses are needed, give the additional dose in the other nostril. Talk to your care team about the use of this medication in children. While this medication may be prescribed for children as young as newborns for selected conditions, precautions do apply.  Naloxone Overdosage: If you think you have taken too much of this medicine contact a poison control center or emergency room at once.  NOTE: This medicine is only for you. Do not share this medicine with others.  What if I miss a dose? This does not apply.  What may interact with this medication? This is only used during an emergency. No interactions are expected during emergency use. This list may not describe all possible interactions. Give your health care provider a list of all the medicines, herbs, non-prescription drugs, or dietary supplements you use. Also tell them if you smoke, drink alcohol, or use  illegal drugs. Some items may interact with your medicine.  What should I watch for while using this medication? Keep this medication ready for use in the case of an opioid overdose. Make sure that you have the phone number of your care team and local hospital ready. You may need to have additional doses of this medication. Each nasal spray  contains a single dose. Some emergencies may require additional doses. After use, bring the treated person to the nearest hospital or call 911. Make sure the treating care team knows that the person has received a dose of this medication. You will receive additional instructions on what to do during and after use of this medication before an emergency occurs.  What side effects may I notice from receiving this medication? Side effects that you should report to your care team as soon as possible: Allergic reactions--skin rash, itching, hives, swelling of the face, lips, tongue, or throat Side effects that usually do not require medical attention (report these to your care team if they continue or are bothersome): Constipation Dryness or irritation inside the nose Headache Increase in blood pressure Muscle spasms Stuffy nose Toothache This list may not describe all possible side effects. Call your doctor for medical advice about side effects. You may report side effects to FDA at 1-800-FDA-1088.  Where should I keep my medication? Because this is an emergency medication, you should keep it with you at all times.  Keep out of the reach of children and pets. Store between 20 and 25 degrees C (68 and 77 degrees F). Do not freeze. Throw away any unused medication after the expiration date. Keep in original box until ready to use.  NOTE: This sheet is a summary. It may not cover all possible information. If you have questions about this medicine, talk to your doctor, pharmacist, or health care provider.   2023 Elsevier/Gold Standard (2021-08-12 00:00:00)  ____________________________________________________________________________________________  ______________________________________________________________________  Procedure instructions  Do not eat or drink fluids (other than water) for 6 hours before your procedure  No water for 2 hours before your procedure  Take your blood  pressure medicine with a sip of water  Arrive 30 minutes before your appointment  Carefully read the "Preparing for your procedure" detailed instructions  If you have questions call us at [phone]  _____________________________________________________________________    ______________________________________________________________________  Preparing for your procedure  Appointments: If you think you may not be able to keep your appointment, call 24-48 hours in advance to cancel. We need time to make it available to others.  During your procedure appointment there will be: No Prescription Refills. No disability issues to discussed. No medication changes or discussions.  Instructions: Food intake: Avoid eating anything solid for at least 8 hours prior to your procedure. Clear liquid intake: You may take clear liquids such as water up to 2 hours prior to your procedure. (No carbonated drinks. No soda.) Transportation: Unless otherwise stated by your physician, bring a driver. Morning Medicines: Except for blood thinners, take all of your other morning medications with a sip of water. Make sure to take your heart and blood pressure medicines. If your blood pressure's lower number is above 100, the case will be rescheduled. Blood thinners: Make sure to stop your blood thinners as instructed.  If you take a blood thinner, but were not instructed to stop it, call our office [phone] and ask to talk to a nurse. Not stopping a blood thinner prior to certain procedures could lead  to serious complications. Diabetics on insulin: Notify the staff so that you can be scheduled 1st case in the morning. If your diabetes requires high dose insulin, take only  of your normal insulin dose the morning of the procedure and notify the staff that you have done so. Preventing infections: Shower with an antibacterial soap the morning of your procedure.  Build-up your immune system: Take 1000 mg of  Vitamin C with every meal (3 times a day) the day prior to your procedure. Antibiotics: Inform the nursing staff if you are taking any antibiotics or if you have any conditions that may require antibiotics prior to procedures. (Example: recent joint implants)   Pregnancy: If you are pregnant make sure to notify the nursing staff. Not doing so may result in injury to the fetus, including death.  Sickness: If you have a cold, fever, or any active infections, call and cancel or reschedule your procedure. Receiving steroids while having an infection may result in complications. Arrival: You must be in the facility at least 30 minutes prior to your scheduled procedure. Tardiness: Your scheduled time is also the cutoff time. If you do not arrive at least 15 minutes prior to your procedure, you will be rescheduled.  Children: Do not bring any children with you. Make arrangements to keep them home. Dress appropriately: There is always a possibility that your clothing may get soiled. Avoid long dresses. Valuables: Do not bring any jewelry or valuables.  Reasons to call and reschedule or cancel your procedure: (Following these recommendations will minimize the risk of a serious complication.) Surgeries: Avoid having procedures within 2 weeks of any surgery. (Avoid for 2 weeks before or after any surgery). Flu Shots: Avoid having procedures within 2 weeks of a flu shots or . (Avoid for 2 weeks before or after immunizations). Barium: Avoid having a procedure within 7-10 days after having had a radiological study involving the use of radiological contrast. (Myelograms, Barium swallow or enema study). Heart attacks: Avoid any elective procedures or surgeries for the initial 6 months after a "Myocardial Infarction" (Heart Attack). Blood thinners: It is imperative that you stop these medications before procedures. Let us know if you if you take any blood thinner.  Infection: Avoid procedures during or within two  weeks of an infection (including chest colds or gastrointestinal problems). Symptoms associated with infections include: Localized redness, fever, chills, night sweats or profuse sweating, burning sensation when voiding, cough, congestion, stuffiness, runny nose, sore throat, diarrhea, nausea, vomiting, cold or Flu symptoms, recent or current infections. It is specially important if the infection is over the area that we intend to treat. Heart and lung problems: Symptoms that may suggest an active cardiopulmonary problem include: cough, chest pain, breathing difficulties or shortness of breath, dizziness, ankle swelling, uncontrolled high or unusually low blood pressure, and/or palpitations. If you are experiencing any of these symptoms, cancel your procedure and contact your primary care physician for an evaluation.  Remember:  Regular Business hours are:  Monday to Thursday 8:00 AM to 4:00 PM  Provider's Schedule: [name], MD:  Procedure days: Tuesday and Thursday 7:30 AM to 4:00 PM  Edward Jolly, MD:  Procedure days: Monday and Wednesday 7:30 AM to 4:00 PM  ______________________________________________________________________    ____________________________________________________________________________________________  General Risks and Possible Complications  Patient Responsibilities: It is important that you read this as it is part of your informed consent. It is our duty to inform you of the risks and possible complications associated with treatments offered  to you. It is your responsibility as a patient to read this and to ask questions about anything that is not clear or that you believe was not covered in this document.  Patient's Rights: You have the right to refuse treatment. You also have the right to change your mind, even after initially having agreed to have the treatment done. However, under this last option, if you wait until the last second to change your mind,  you may be charged for the materials used up to that point.  Introduction: Medicine is not an Visual merchandiser. Everything in Medicine, including the lack of treatment(s), carries the potential for danger, harm, or loss (which is by definition: Risk). In Medicine, a complication is a secondary problem, condition, or disease that can aggravate an already existing one. All treatments carry the risk of possible complications. The fact that a side effects or complications occurs, does not imply that the treatment was conducted incorrectly. It must be clearly understood that these can happen even when everything is done following the highest safety standards.  No treatment: You can choose not to proceed with the proposed treatment alternative. The "PRO(s)" would include: avoiding the risk of complications associated with the therapy. The "CON(s)" would include: not getting any of the treatment benefits. These benefits fall under one of three categories: diagnostic; therapeutic; and/or palliative. Diagnostic benefits include: getting information which can ultimately lead to improvement of the disease or symptom(s). Therapeutic benefits are those associated with the successful treatment of the disease. Finally, palliative benefits are those related to the decrease of the primary symptoms, without necessarily curing the condition (example: decreasing the pain from a flare-up of a chronic condition, such as incurable terminal cancer).  General Risks and Complications: These are associated to most interventional treatments. They can occur alone, or in combination. They fall under one of the following six (6) categories: no benefit or worsening of symptoms; bleeding; infection; nerve damage; allergic reactions; and/or death. No benefits or worsening of symptoms: In Medicine there are no guarantees, only probabilities. No healthcare provider can ever guarantee that a medical treatment will work, they can only state the  probability that it may. Furthermore, there is always the possibility that the condition may worsen, either directly, or indirectly, as a consequence of the treatment. Bleeding: This is more common if the patient is taking a blood thinner, either prescription or over the counter (example: Goody Powders, Fish oil, Aspirin, Garlic, etc.), or if suffering a condition associated with impaired coagulation (example: Hemophilia, cirrhosis of the liver, low platelet counts, etc.). However, even if you do not have one on these, it can still happen. If you have any of these conditions, or take one of these drugs, make sure to notify your treating physician. Infection: This is more common in patients with a compromised immune system, either due to disease (example: diabetes, cancer, human immunodeficiency virus [HIV], etc.), or due to medications or treatments (example: therapies used to treat cancer and rheumatological diseases). However, even if you do not have one on these, it can still happen. If you have any of these conditions, or take one of these drugs, make sure to notify your treating physician. Nerve Damage: This is more common when the treatment is an invasive one, but it can also happen with the use of medications, such as those used in the treatment of cancer. The damage can occur to small secondary nerves, or to large primary ones, such as those in the spinal cord and  brain. This damage may be temporary or permanent and it may lead to impairments that can range from temporary numbness to permanent paralysis and/or brain death. Allergic Reactions: Any time a substance or material comes in contact with our body, there is the possibility of an allergic reaction. These can range from a mild skin rash (contact dermatitis) to a severe systemic reaction (anaphylactic reaction), which can result in death. Death: In general, any medical intervention can result in death, most of the time due to an unforeseen  complication. ____________________________________________________________________________________________

## 2023-08-01 ENCOUNTER — Telehealth: Payer: Self-pay

## 2023-08-01 NOTE — Telephone Encounter (Signed)
Called pt and vm was full unable to leave a message to return call to the office.

## 2023-08-01 NOTE — Telephone Encounter (Signed)
Patient states she is returning a call from [employer], CMA.  I was unable to reach [facility] at the time of the call.  I let patient know that I will send her a message so she can call her back.

## 2023-08-09 IMAGING — US US RENAL
1 series · 15 of 25 positions shown · non-contrast
Comparison: 07/20/2021

CLINICAL DATA: Decreased GFR

EXAM:
RENAL / URINARY TRACT ULTRASOUND COMPLETE

[Series 1: us renal · 15 of 39 slices shown]
[im 1/39]
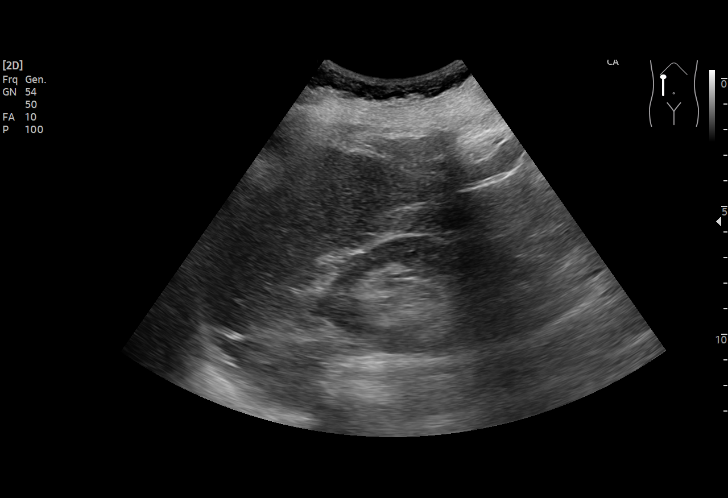
[im 4/39]
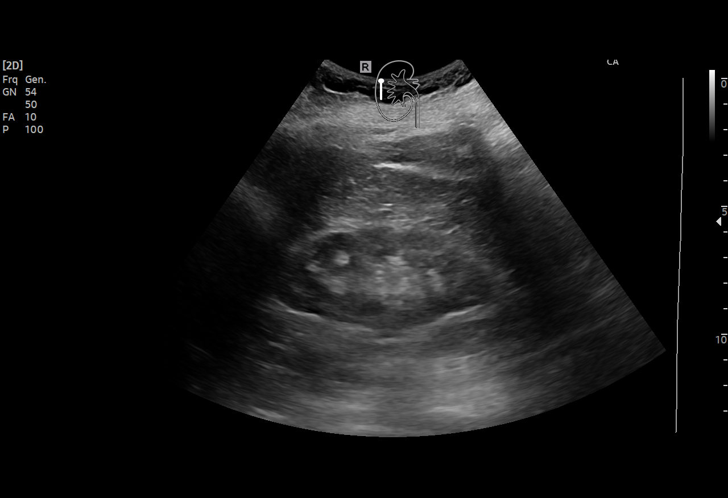
[im 7/39]
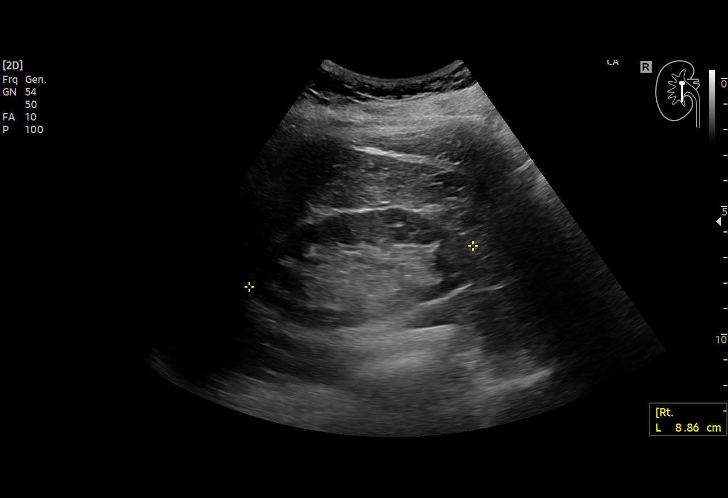
[im 8/39]
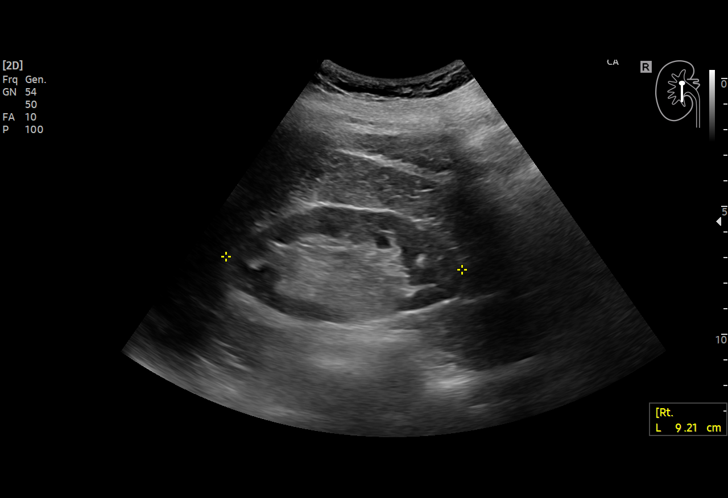
[im 12/39]
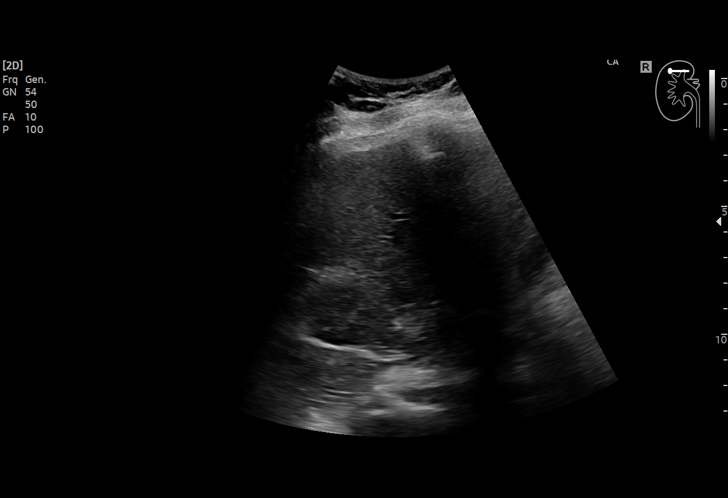
[im 15/39]
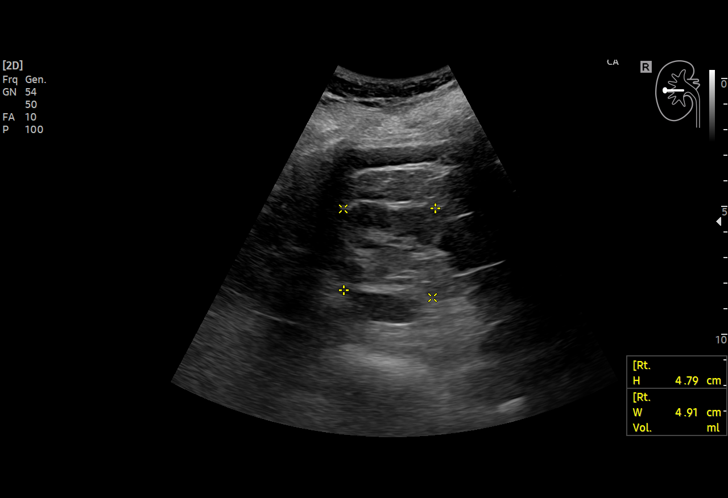
[im 16/39]
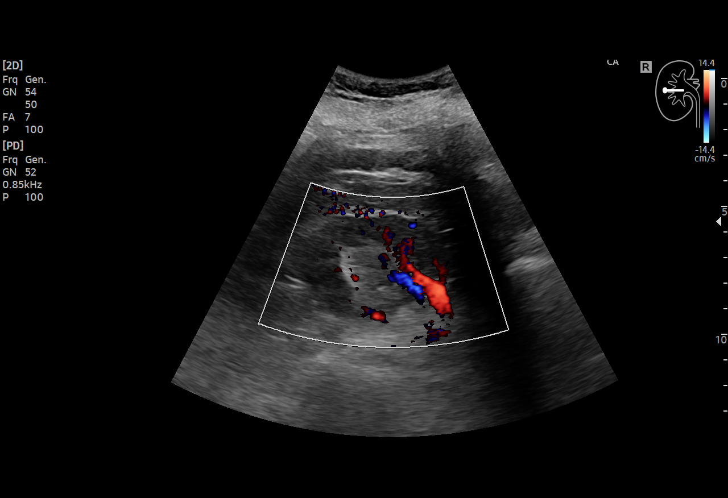
[im 20/39]
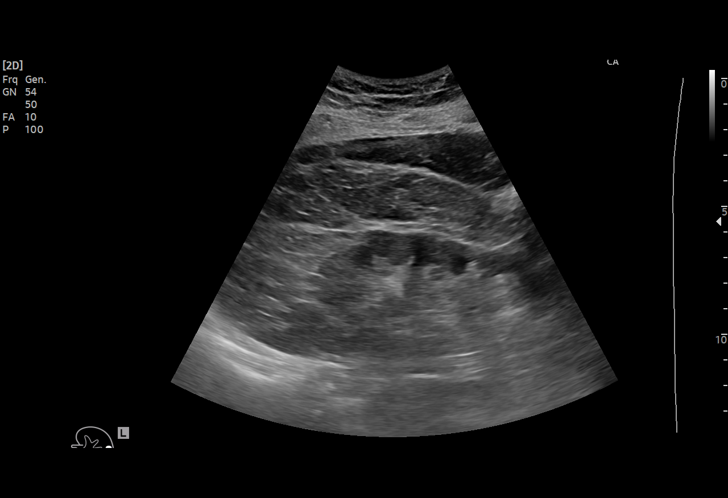
[im 23/39]
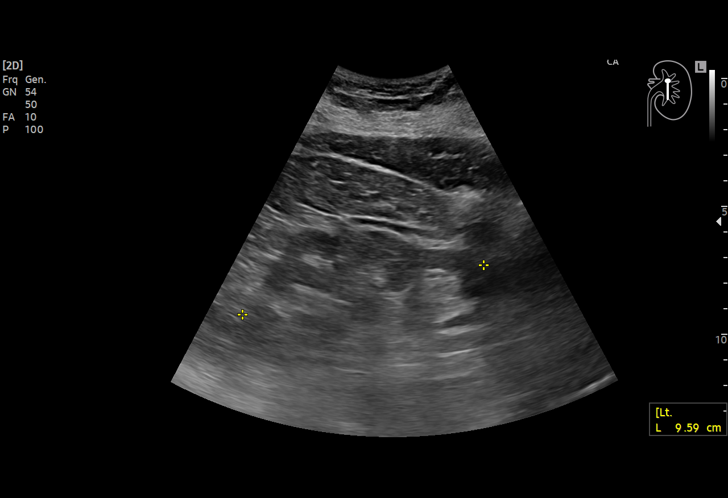
[im 24/39]
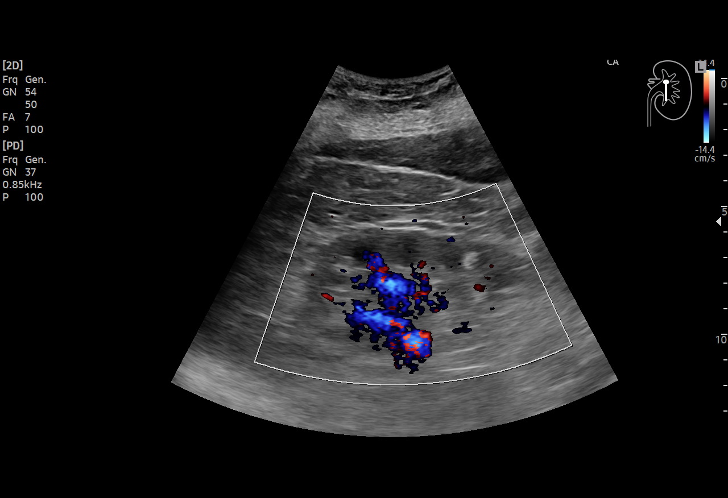
[im 27/39]
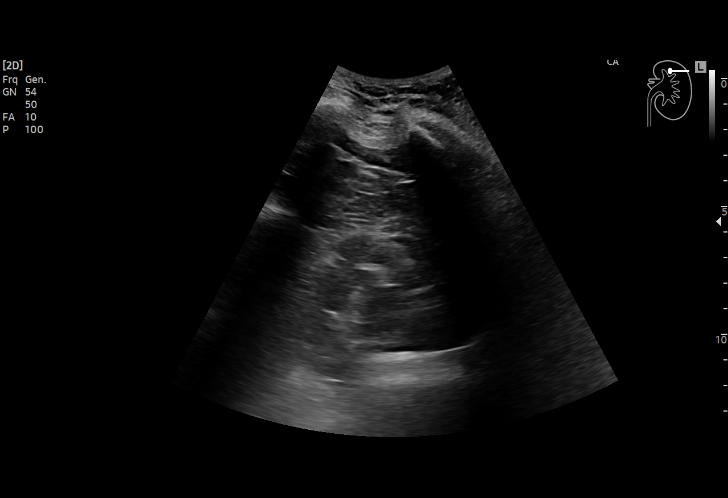
[im 31/39]
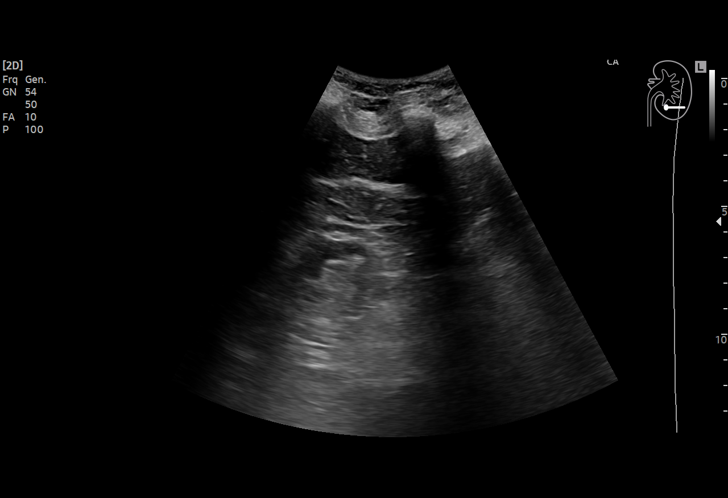
[im 32/39]
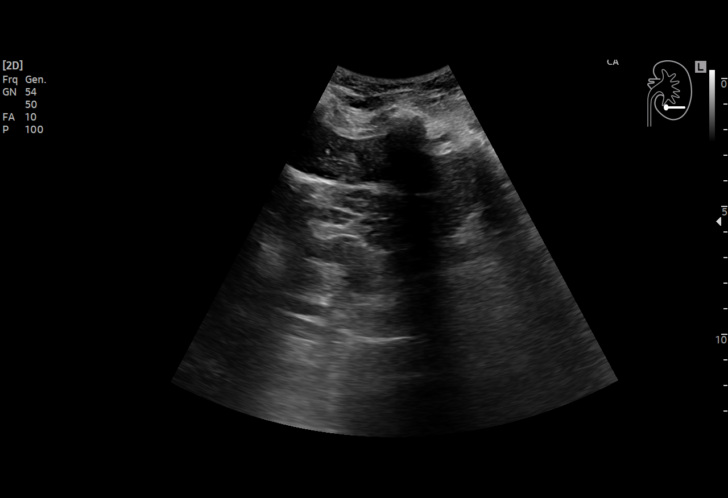
[im 35/39]
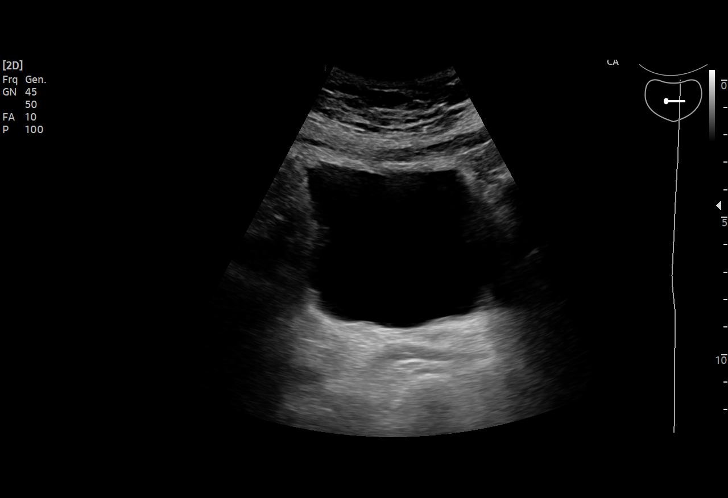
[im 39/39]
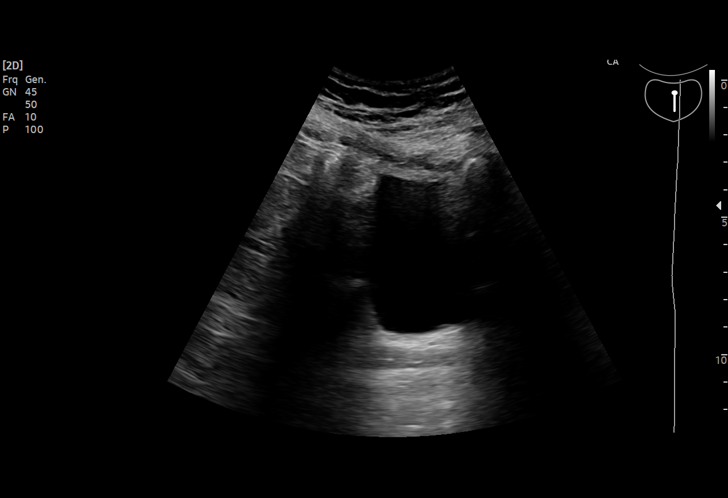

[15 of 25 positions shown; findings below may reference images not displayed]

FINDINGS: Right Kidney:

Renal measurements: 9.2 x 4.8 x 4.9 cm = volume: 113 mL. There is no
hydronephrosis. There is slightly increased cortical echogenicity.

Left Kidney:

Renal measurements: 10 x 4.7 x 3.8 cm = volume: 91 mL. There is no
hydronephrosis. There is increased cortical echogenicity.

Bladder:

Appears normal for degree of bladder distention.

Other:

None.
IMPRESSION: There is no hydronephrosis. Increased cortical echogenicity in the
kidneys may suggest medical renal disease.

## 2023-08-10 ENCOUNTER — Encounter: Payer: Self-pay | Admitting: *Deleted

## 2023-08-12 ENCOUNTER — Encounter: Payer: Self-pay | Admitting: Family Medicine

## 2023-08-12 DIAGNOSIS — R399 Unspecified symptoms and signs involving the genitourinary system: Secondary | ICD-10-CM | POA: Insufficient documentation

## 2023-08-12 NOTE — Assessment & Plan Note (Addendum)
1 week of symptoms Currently on prophylactic treatment with Macrobid and follows with Urology Request urine sample be brought to office in am for evaluation

## 2023-08-12 NOTE — Patient Instructions (Signed)
It was a pleasure meeting you today. Thank you for allowing me to take part in your health care.  Our goals for today as we discussed include:   Will send urine for culture Increase water intake  Follow up with PCP and Urology if worsening symptoms    If you have any questions or concerns, please do not hesitate to call the office at [phone].  I look forward to our next visit and until then take care and stay safe.  Regards,   Dana Allan, MD   [facility]

## 2023-08-14 NOTE — Addendum Note (Signed)
Addended by: Enid Cutter on: 08/14/2023 05:15 PM   Modules accepted: Level of Service

## 2023-08-15 ENCOUNTER — Inpatient Hospital Stay: Payer: Medicare HMO

## 2023-08-15 ENCOUNTER — Inpatient Hospital Stay: Payer: Medicare HMO | Admitting: Oncology

## 2023-08-15 ENCOUNTER — Encounter: Payer: Self-pay | Admitting: Oncology

## 2023-08-15 ENCOUNTER — Inpatient Hospital Stay: Payer: Medicare HMO | Attending: Oncology

## 2023-08-15 ENCOUNTER — Other Ambulatory Visit: Payer: Self-pay

## 2023-08-15 VITALS — BP 144/52 | HR 54 | Temp 97.2°F | Resp 16 | Wt 174.8 lb

## 2023-08-15 DIAGNOSIS — M810 Age-related osteoporosis without current pathological fracture: Secondary | ICD-10-CM

## 2023-08-15 DIAGNOSIS — Z79811 Long term (current) use of aromatase inhibitors: Secondary | ICD-10-CM | POA: Diagnosis not present

## 2023-08-15 DIAGNOSIS — Z79899 Other long term (current) drug therapy: Secondary | ICD-10-CM | POA: Insufficient documentation

## 2023-08-15 DIAGNOSIS — G20A1 Parkinson's disease without dyskinesia, without mention of fluctuations: Secondary | ICD-10-CM | POA: Diagnosis not present

## 2023-08-15 DIAGNOSIS — D631 Anemia in chronic kidney disease: Secondary | ICD-10-CM | POA: Insufficient documentation

## 2023-08-15 DIAGNOSIS — D649 Anemia, unspecified: Secondary | ICD-10-CM

## 2023-08-15 DIAGNOSIS — Z170 Estrogen receptor positive status [ER+]: Secondary | ICD-10-CM | POA: Insufficient documentation

## 2023-08-15 DIAGNOSIS — N186 End stage renal disease: Secondary | ICD-10-CM | POA: Diagnosis not present

## 2023-08-15 DIAGNOSIS — C50211 Malignant neoplasm of upper-inner quadrant of right female breast: Secondary | ICD-10-CM | POA: Diagnosis not present

## 2023-08-15 DIAGNOSIS — Z9013 Acquired absence of bilateral breasts and nipples: Secondary | ICD-10-CM | POA: Diagnosis not present

## 2023-08-15 LAB — CBC WITH DIFFERENTIAL/PLATELET
Abs Immature Granulocytes: 0.01 10*3/uL (ref 0.00–0.07)
Basophils Absolute: 0.1 10*3/uL (ref 0.0–0.1)
Basophils Relative: 1 %
Eosinophils Absolute: 0.2 10*3/uL (ref 0.0–0.5)
Eosinophils Relative: 3 %
HCT: 29.7 % — ABNORMAL LOW (ref 36.0–46.0)
Hemoglobin: 9.5 g/dL — ABNORMAL LOW (ref 12.0–15.0)
Immature Granulocytes: 0 %
Lymphocytes Relative: 21 %
Lymphs Abs: 1.1 10*3/uL (ref 0.7–4.0)
MCH: 30.3 pg (ref 26.0–34.0)
MCHC: 32 g/dL (ref 30.0–36.0)
MCV: 94.6 fL (ref 80.0–100.0)
Monocytes Absolute: 0.5 10*3/uL (ref 0.1–1.0)
Monocytes Relative: 10 %
Neutro Abs: 3.5 10*3/uL (ref 1.7–7.7)
Neutrophils Relative %: 65 %
Platelets: 215 10*3/uL (ref 150–400)
RBC: 3.14 MIL/uL — ABNORMAL LOW (ref 3.87–5.11)
RDW: 14.5 % (ref 11.5–15.5)
WBC: 5.3 10*3/uL (ref 4.0–10.5)
nRBC: 0 % (ref 0.0–0.2)

## 2023-08-15 LAB — BASIC METABOLIC PANEL
Anion gap: 5 (ref 5–15)
BUN: 27 mg/dL — ABNORMAL HIGH (ref 8–23)
CO2: 27 mmol/L (ref 22–32)
Calcium: 9.3 mg/dL (ref 8.9–10.3)
Chloride: 102 mmol/L (ref 98–111)
Creatinine, Ser: 1.01 mg/dL — ABNORMAL HIGH (ref 0.44–1.00)
GFR, Estimated: 55 mL/min — ABNORMAL LOW (ref >60)
Glucose, Bld: 97 mg/dL (ref 70–99)
Potassium: 4.4 mmol/L (ref 3.5–5.1)
Sodium: 134 mmol/L — ABNORMAL LOW (ref 135–145)

## 2023-08-15 MED ORDER — ZOLEDRONIC ACID 4 MG/5ML IV CONC
3.5000 mg | Freq: Once | INTRAVENOUS | Status: AC
  Administered 2023-08-15: 3.5 mg via INTRAVENOUS
  Filled 2023-08-15: qty 4.38

## 2023-08-15 MED ORDER — SODIUM CHLORIDE 0.9 % IV SOLN
Freq: Once | INTRAVENOUS | Status: AC
  Filled 2023-08-15: qty 250

## 2023-08-15 MED ORDER — EPOETIN ALFA-EPBX 40000 UNIT/ML IJ SOLN
40000.0000 [IU] | Freq: Once | INTRAMUSCULAR | Status: AC
  Administered 2023-08-15: 40000 [IU] via SUBCUTANEOUS
  Filled 2023-08-15: qty 1

## 2023-08-15 NOTE — Progress Notes (Signed)
[facility]  [phone] [phone]) [phone]  ID: Wanda Martinez OB: Sep 01, 1935  MR#: [id number]  OZD#:[id number]  Patient Care Team: Dale Whetstone, MD as PCP - General (Internal Medicine) Debbe Odea, MD as PCP - Cardiology (Cardiology) [name], MD as Consulting Physician (Pain Medicine)  CHIEF COMPLAINT: History of breast cancer, anemia unspecified, osteoporosis.  INTERVAL HISTORY: Patient returns to clinic today for repeat laboratory work, further evaluation, and continuation of Retacrit and Zometa.  She continues to have chronic weakness and fatigue, but otherwise feels well.  She has no new neurologic complaints.  She denies any recent fevers or illnesses.  She has a good appetite and denies weight loss.  She denies any chest pain, shortness of breath, cough, or hemoptysis.  She denies any nausea, vomiting, constipation, or diarrhea.  She has no urinary complaints.  Patient offers no further specific complaints today.  REVIEW OF SYSTEMS:   Review of Systems  Constitutional: Negative.  Negative for fever, malaise/fatigue and weight loss.  Respiratory: Negative.  Negative for cough and shortness of breath.   Cardiovascular: Negative.  Negative for chest pain and leg swelling.  Gastrointestinal: Negative.  Negative for abdominal pain.  Genitourinary: Negative.   Musculoskeletal: Negative.  Negative for back pain and joint pain.  Skin: Negative.  Negative for rash.  Neurological:  Positive for tremors. Negative for sensory change, speech change, focal weakness and weakness.  Psychiatric/Behavioral: Negative.  Negative for depression. The patient is not nervous/anxious.     As per HPI. Otherwise, a complete review of systems is negative.  PAST MEDICAL HISTORY: Past Medical History:  Diagnosis Date   Acute postoperative pain 10/25/2017   Anemia    Arm pain 07/26/2015   Arthritis    Arthritis, degenerative 03/26/2014   Back pain  11/01/2013   Bladder infection 06/2018   Breast cancer (HCC)    Masectomy - left - 1986    Breast cancer [facility])    Mastectomy-right -2014   CHEST PAIN 04/29/2010   Qualifier: Diagnosis of  By: Laural Benes RN, Erika     Chronic cystitis    Cystocele 02/22/2013   Cystocele, midline 08/19/2013   Degeneration of intervertebral disc of lumbosacral region 03/26/2014   DYSPNEA 04/29/2010   Qualifier: Diagnosis of  By: Laural Benes RN, Erika     Enthesopathy of hip 03/26/2014   GERD (gastroesophageal reflux disease)    Hiatal hernia    HTN (hypertension)    Hypokalemia 06/2018   Hyponatremia 06/2018   LBP (low back pain) 03/26/2014   Neck pain 11/01/2013   Parkinson disease    Pneumonia 06/2018   Sinusitis 02/07/2015   Skin lesions 07/12/2014   Urinary incontinence    mixed     PAST SURGICAL HISTORY: Past Surgical History:  Procedure Laterality Date   ABDOMINAL HYSTERECTOMY     BLEPHAROPLASTY     BREAST BIOPSY  2013   BREAST ENHANCEMENT SURGERY  1987   BREAST IMPLANT REMOVAL     BREAST IMPLANT REMOVAL Right 08/29/2012   BREAST SURGERY  1986   s/p left mastectomy   COLONOSCOPY WITH PROPOFOL N/A 09/13/2016   Procedure: COLONOSCOPY WITH PROPOFOL;  Surgeon: Scot Jun, MD;  Location: [facility] ENDOSCOPY;  Service: Endoscopy;  Laterality: N/A;   MASTECTOMY  08/2012   right   TONSILLECTOMY AND ADENOIDECTOMY  1979   VESICOVAGINAL FISTULA CLOSURE W/ TAH  1983    FAMILY HISTORY Family History  Problem Relation Age of Onset   Diabetes Father  Stroke Father    Colon polyps Father    Stroke Mother    Parkinson's disease Mother        ADVANCED DIRECTIVES:    HEALTH MAINTENANCE: Social History   Tobacco Use   Smoking status: Never    Passive exposure: Never   Smokeless tobacco: Never   Tobacco comments:    tobacco use - no  Vaping Use   Vaping status: Never Used  Substance Use Topics   Alcohol use: No    Alcohol/week: 0.0 standard drinks of alcohol   Drug use: No     Allergies   Allergen Reactions   Sulfa Antibiotics Nausea Only   Vesicare [Solifenacin] Other (See Comments)    Constipation Constipation    Current Outpatient Medications  Medication Sig Dispense Refill   amLODipine (NORVASC) 2.5 MG tablet Take 1 tablet (2.5 mg total) by mouth daily. 30 tablet 0   aspirin 81 MG EC tablet Take 81 mg by mouth daily as needed.     calcium carbonate (OSCAL) 1500 (600 Ca) MG TABS tablet Take 1 tablet by mouth daily.     ciprofloxacin (CIPRO) 500 MG tablet Take 500 mg by mouth 2 (two) times daily.     citalopram (CELEXA) 20 MG tablet TAKE 1 TABLET BY MOUTH ONCE DAILY. 30 tablet 11   cyclobenzaprine (FLEXERIL) 10 MG tablet TAKE ONE TABLET BY MOUTH AT BEDTIME. 30 tablet 11   donepezil (ARICEPT) 10 MG tablet TAKE ONE TABLET BY MOUTH AT BEDTIME 30 tablet 11   erythromycin ophthalmic ointment Apply to eye.     gabapentin (NEURONTIN) 300 MG capsule Take 1 capsule (300 mg total) by mouth at bedtime. 90 capsule 0   Glucosamine-Chondroitin 500-400 MG CAPS Take 2 capsules by mouth daily.     hydrocortisone (ANUSOL-HC) 25 MG suppository Place 1 suppository (25 mg total) rectally 2 (two) times daily. 12 suppository 0   letrozole (FEMARA) 2.5 MG tablet Take 2.5 mg by mouth daily.     losartan (COZAAR) 50 MG tablet TAKE (2) TABLETS BY MOUTH ONCE DAILY. 60 tablet 11   lubiprostone (AMITIZA) 24 MCG capsule TAKE 1 CAPSULE BY MOUTH ONCE DAILY WITH BREAKFAST.     magnesium oxide (MAG-OX) 400 (240 Mg) MG tablet TAKE ONE TABLET BY MOUTH EVERY DAY 30 tablet 11   melatonin 1 MG TABS tablet Take 3 mg by mouth at bedtime.     metoprolol succinate (TOPROL-XL) 25 MG 24 hr tablet Take 1 tablet (25 mg total) by mouth 2 (two) times daily. 180 tablet 1   mirabegron ER (MYRBETRIQ) 50 MG TB24 tablet Take 1 tablet (50 mg total) by mouth daily. 30 tablet 11   Multiple Vitamin (MULTIVITAMIN) tablet Take 1 tablet by mouth daily.     naloxone (NARCAN) nasal spray 4 mg/0.1 mL Place 1 spray into the nose as  needed for up to 365 doses (for opioid-induced respiratory depresssion). In case of emergency (overdose), spray once into each nostril. If no response within 3 minutes, repeat application and call 911. 1 each 0   nitrofurantoin, macrocrystal-monohydrate, (MACROBID) 100 MG capsule Take 1 capsule (100 mg total) by mouth daily. 90 capsule 3   Omega-3 1000 MG CAPS Take by mouth.     [START ON 08/16/2023] oxyCODONE (OXY IR/ROXICODONE) 5 MG immediate release tablet Take 1 tablet (5 mg total) by mouth every 8 (eight) hours as needed for severe pain or breakthrough pain (PRN for post-radiofrequency pain). Must last 30 days. 90 tablet 0   [START  ON 09/15/2023] oxyCODONE (OXY IR/ROXICODONE) 5 MG immediate release tablet Take 1 tablet (5 mg total) by mouth every 8 (eight) hours as needed for severe pain or breakthrough pain (PRN for post-radiofrequency pain). Must last 30 days. 90 tablet 0   [START ON 10/15/2023] oxyCODONE (OXY IR/ROXICODONE) 5 MG immediate release tablet Take 1 tablet (5 mg total) by mouth every 8 (eight) hours as needed for severe pain or breakthrough pain (PRN for post-radiofrequency pain). Must last 30 days. 90 tablet 0   pantoprazole (PROTONIX) 40 MG tablet Take 1 tablet (40 mg total) by mouth daily. 30 tablet 11   rosuvastatin (CRESTOR) 20 MG tablet TAKE ONE TABLET BY MOUTH AT BEDTIME 30 tablet 11   No current facility-administered medications for this visit.    OBJECTIVE: Vitals:   08/15/23 0922  BP: (!) 144/52  Pulse: (!) 54  Resp: 16  Temp: (!) 97.2 F (36.2 C)  SpO2: 98%     Body mass index is 30 kg/m.    ECOG FS:1 - Symptomatic but completely ambulatory  General: Well-developed, well-nourished, no acute distress.  Patient in wheelchair. Eyes: Pink conjunctiva, anicteric sclera. HEENT: Normocephalic, moist mucous membranes. Lungs: No audible wheezing or coughing. Heart: Regular rate and rhythm. Abdomen: Soft, nontender, no obvious distention. Musculoskeletal: No edema,  cyanosis, or clubbing. Neuro: Alert, answering all questions appropriately. Cranial nerves grossly intact.  Resting tremor noted. Skin: No rashes or petechiae noted. Psych: Normal affect.  LAB RESULTS:  Lab Results  Component Value Date   NA 133 (L) 05/15/2023   K 3.5 05/15/2023   CL 95 (L) 05/15/2023   CO2 28 05/15/2023   GLUCOSE 88 05/15/2023   BUN 35 (H) 05/15/2023   CREATININE 1.44 (H) 05/15/2023   CALCIUM 9.3 05/15/2023   PROT 6.7 04/09/2023   ALBUMIN 4.1 04/09/2023   AST 26 04/09/2023   ALT 18 04/09/2023   ALKPHOS 30 (L) 04/09/2023   BILITOT 0.4 04/09/2023   GFRNONAA 36 (L) 05/15/2023   GFRAA >60 07/27/2020    Lab Results  Component Value Date   WBC 5.3 08/15/2023   NEUTROABS 3.5 08/15/2023   HGB 9.5 (L) 08/15/2023   HCT 29.7 (L) 08/15/2023   MCV 94.6 08/15/2023   PLT 215 08/15/2023     STUDIES: No results found.  ASSESSMENT: History of breast cancer, anemia unspecified, osteoporosis.  PLAN:    Anemia: Patient's hemoglobin is 9.5 today. Previously, her iron stores all of her other laboratory work including iron stores were either negative or within normal limits.  Possibly related to her underlying chronic renal insufficiency.  Proceed with Retacrit today.  It appears patient only requires treatment every 6 to 8 weeks.  Return to clinic every 6 weeks for laboratory work and consideration of Retacrit if needed.  Patient will then return to clinic in 6 months for further evaluation and continuation of treatment.    Osteoporosis: Patient's most recent bone mineral density on July 09, 2023 was reported -2.7 which is essentially unchanged from previous.  Patient previously received Prolia, but this has been switched to Zometa secondary to insurance purposes.  Zometa is dose reduced secondary to her renal insufficiency.  Continue calcium and vitamin D supplementation.  Proceed with treatment today.  Return to clinic as above for Retacrit and then in 6 months for further  evaluation for consideration of Zometa.   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  She completed 5 years of letrozole in May 2019.  No further interventions are needed.  Patient has had bilateral mastectomies, therefore no further mammograms are necessary.  Back/joint pain: Chronic and unchanged.  Continue symptomatic treatment with pain clinic.   Parkinson's: Chronic and unchanged.  Continue follow-up with neurology.   Patient expressed understanding and was in agreement with this plan. She also understands that She can call clinic at any time with any questions, concerns, or complaints.   Breast cancer [facility])   Staging form: Breast, AJCC 7th Edition     Clinical stage from 09/22/2015: Stage IIA (T1c, N1, M0) - Signed by Jeralyn Ruths, MD on 09/22/2015   Jeralyn Ruths, MD   08/15/2023 10:17 AM

## 2023-08-22 ENCOUNTER — Telehealth: Payer: Self-pay

## 2023-08-22 DIAGNOSIS — L0231 Cutaneous abscess of buttock: Secondary | ICD-10-CM | POA: Diagnosis not present

## 2023-08-22 DIAGNOSIS — E876 Hypokalemia: Secondary | ICD-10-CM | POA: Diagnosis not present

## 2023-08-22 DIAGNOSIS — L03317 Cellulitis of buttock: Secondary | ICD-10-CM | POA: Diagnosis not present

## 2023-08-22 DIAGNOSIS — K5909 Other constipation: Secondary | ICD-10-CM | POA: Diagnosis not present

## 2023-08-22 DIAGNOSIS — D631 Anemia in chronic kidney disease: Secondary | ICD-10-CM | POA: Diagnosis not present

## 2023-08-22 DIAGNOSIS — G8929 Other chronic pain: Secondary | ICD-10-CM | POA: Diagnosis not present

## 2023-08-22 DIAGNOSIS — I10 Essential (primary) hypertension: Secondary | ICD-10-CM | POA: Diagnosis not present

## 2023-08-22 DIAGNOSIS — I471 Supraventricular tachycardia, unspecified: Secondary | ICD-10-CM | POA: Diagnosis not present

## 2023-08-22 DIAGNOSIS — D649 Anemia, unspecified: Secondary | ICD-10-CM | POA: Diagnosis not present

## 2023-08-22 DIAGNOSIS — F32A Depression, unspecified: Secondary | ICD-10-CM | POA: Diagnosis not present

## 2023-08-22 DIAGNOSIS — I129 Hypertensive chronic kidney disease with stage 1 through stage 4 chronic kidney disease, or unspecified chronic kidney disease: Secondary | ICD-10-CM | POA: Diagnosis not present

## 2023-08-22 DIAGNOSIS — N9089 Other specified noninflammatory disorders of vulva and perineum: Secondary | ICD-10-CM | POA: Diagnosis not present

## 2023-08-22 DIAGNOSIS — N1831 Chronic kidney disease, stage 3a: Secondary | ICD-10-CM | POA: Diagnosis not present

## 2023-08-22 DIAGNOSIS — F03A0 Unspecified dementia, mild, without behavioral disturbance, psychotic disturbance, mood disturbance, and anxiety: Secondary | ICD-10-CM | POA: Diagnosis not present

## 2023-08-22 DIAGNOSIS — N183 Chronic kidney disease, stage 3 unspecified: Secondary | ICD-10-CM | POA: Diagnosis not present

## 2023-08-23 ENCOUNTER — Ambulatory Visit (HOSPITAL_BASED_OUTPATIENT_CLINIC_OR_DEPARTMENT_OTHER): Payer: Medicare HMO | Admitting: Pain Medicine

## 2023-08-23 DIAGNOSIS — L0231 Cutaneous abscess of buttock: Secondary | ICD-10-CM | POA: Insufficient documentation

## 2023-08-23 DIAGNOSIS — Z5189 Encounter for other specified aftercare: Secondary | ICD-10-CM

## 2023-08-23 DIAGNOSIS — L03317 Cellulitis of buttock: Secondary | ICD-10-CM | POA: Diagnosis not present

## 2023-08-23 DIAGNOSIS — E876 Hypokalemia: Secondary | ICD-10-CM | POA: Diagnosis not present

## 2023-08-23 DIAGNOSIS — N1831 Chronic kidney disease, stage 3a: Secondary | ICD-10-CM | POA: Diagnosis not present

## 2023-08-23 DIAGNOSIS — I129 Hypertensive chronic kidney disease with stage 1 through stage 4 chronic kidney disease, or unspecified chronic kidney disease: Secondary | ICD-10-CM | POA: Diagnosis not present

## 2023-08-23 DIAGNOSIS — Z91199 Patient's noncompliance with other medical treatment and regimen due to unspecified reason: Secondary | ICD-10-CM

## 2023-08-23 DIAGNOSIS — N9089 Other specified noninflammatory disorders of vulva and perineum: Secondary | ICD-10-CM | POA: Insufficient documentation

## 2023-08-23 NOTE — Progress Notes (Signed)
(  08/23/2023) [facility] rescheduled.

## 2023-08-23 NOTE — Patient Instructions (Signed)

## 2023-08-24 DIAGNOSIS — I129 Hypertensive chronic kidney disease with stage 1 through stage 4 chronic kidney disease, or unspecified chronic kidney disease: Secondary | ICD-10-CM | POA: Diagnosis not present

## 2023-08-24 DIAGNOSIS — L03317 Cellulitis of buttock: Secondary | ICD-10-CM | POA: Diagnosis not present

## 2023-08-24 DIAGNOSIS — E876 Hypokalemia: Secondary | ICD-10-CM | POA: Diagnosis not present

## 2023-08-24 DIAGNOSIS — N1831 Chronic kidney disease, stage 3a: Secondary | ICD-10-CM | POA: Diagnosis not present

## 2023-08-25 DIAGNOSIS — E876 Hypokalemia: Secondary | ICD-10-CM | POA: Diagnosis not present

## 2023-08-25 DIAGNOSIS — N183 Chronic kidney disease, stage 3 unspecified: Secondary | ICD-10-CM | POA: Diagnosis not present

## 2023-08-25 DIAGNOSIS — N9089 Other specified noninflammatory disorders of vulva and perineum: Secondary | ICD-10-CM | POA: Diagnosis not present

## 2023-08-25 DIAGNOSIS — I10 Essential (primary) hypertension: Secondary | ICD-10-CM | POA: Diagnosis not present

## 2023-08-25 DIAGNOSIS — L0231 Cutaneous abscess of buttock: Secondary | ICD-10-CM | POA: Diagnosis not present

## 2023-08-25 DIAGNOSIS — L03317 Cellulitis of buttock: Secondary | ICD-10-CM | POA: Diagnosis not present

## 2023-08-25 DIAGNOSIS — I129 Hypertensive chronic kidney disease with stage 1 through stage 4 chronic kidney disease, or unspecified chronic kidney disease: Secondary | ICD-10-CM | POA: Diagnosis not present

## 2023-08-28 DIAGNOSIS — L03317 Cellulitis of buttock: Secondary | ICD-10-CM | POA: Diagnosis not present

## 2023-08-28 DIAGNOSIS — L0231 Cutaneous abscess of buttock: Secondary | ICD-10-CM | POA: Diagnosis not present

## 2023-08-28 DIAGNOSIS — I129 Hypertensive chronic kidney disease with stage 1 through stage 4 chronic kidney disease, or unspecified chronic kidney disease: Secondary | ICD-10-CM | POA: Diagnosis not present

## 2023-08-28 DIAGNOSIS — E876 Hypokalemia: Secondary | ICD-10-CM | POA: Diagnosis not present

## 2023-08-28 DIAGNOSIS — N1831 Chronic kidney disease, stage 3a: Secondary | ICD-10-CM | POA: Diagnosis not present

## 2023-08-29 DIAGNOSIS — I10 Essential (primary) hypertension: Secondary | ICD-10-CM | POA: Diagnosis not present

## 2023-08-29 DIAGNOSIS — L0231 Cutaneous abscess of buttock: Secondary | ICD-10-CM | POA: Diagnosis not present

## 2023-08-29 DIAGNOSIS — D649 Anemia, unspecified: Secondary | ICD-10-CM | POA: Diagnosis not present

## 2023-08-29 DIAGNOSIS — G8929 Other chronic pain: Secondary | ICD-10-CM | POA: Diagnosis not present

## 2023-08-29 DIAGNOSIS — L03317 Cellulitis of buttock: Secondary | ICD-10-CM | POA: Diagnosis not present

## 2023-08-30 ENCOUNTER — Telehealth: Payer: Self-pay

## 2023-08-30 NOTE — Transitions of Care (Post Inpatient/ED Visit) (Signed)
   08/30/2023  Name: Wanda Martinez MRN: [id number] DOB: 1935/03/22  Today's TOC FU Call Status: Today's TOC FU Call Status:: Unsuccessful Call (1st Attempt) Unsuccessful Call (1st Attempt) Date: 08/30/23  Attempted to reach the patient regarding the most recent Inpatient/ED visit.  Follow Up Plan: Additional outreach attempts will be made to reach the patient to complete the Transitions of Care (Post Inpatient/ED visit) call.   Jodelle Gross RN, BSN, CCM [facility] RN Care Coordinator/ Transitions of Care Direct Dial: [phone]  Fax: [phone]

## 2023-08-31 DIAGNOSIS — N1831 Chronic kidney disease, stage 3a: Secondary | ICD-10-CM | POA: Diagnosis not present

## 2023-08-31 DIAGNOSIS — D631 Anemia in chronic kidney disease: Secondary | ICD-10-CM | POA: Diagnosis not present

## 2023-08-31 DIAGNOSIS — I129 Hypertensive chronic kidney disease with stage 1 through stage 4 chronic kidney disease, or unspecified chronic kidney disease: Secondary | ICD-10-CM | POA: Diagnosis not present

## 2023-08-31 DIAGNOSIS — L03317 Cellulitis of buttock: Secondary | ICD-10-CM | POA: Diagnosis not present

## 2023-08-31 DIAGNOSIS — L0231 Cutaneous abscess of buttock: Secondary | ICD-10-CM | POA: Insufficient documentation

## 2023-08-31 DIAGNOSIS — Z853 Personal history of malignant neoplasm of breast: Secondary | ICD-10-CM | POA: Insufficient documentation

## 2023-08-31 DIAGNOSIS — F419 Anxiety disorder, unspecified: Secondary | ICD-10-CM | POA: Diagnosis not present

## 2023-08-31 DIAGNOSIS — R251 Tremor, unspecified: Secondary | ICD-10-CM | POA: Insufficient documentation

## 2023-08-31 DIAGNOSIS — F32A Depression, unspecified: Secondary | ICD-10-CM | POA: Diagnosis not present

## 2023-08-31 DIAGNOSIS — M810 Age-related osteoporosis without current pathological fracture: Secondary | ICD-10-CM | POA: Diagnosis not present

## 2023-08-31 DIAGNOSIS — E7800 Pure hypercholesterolemia, unspecified: Secondary | ICD-10-CM | POA: Diagnosis not present

## 2023-09-03 ENCOUNTER — Telehealth: Payer: Self-pay | Admitting: *Deleted

## 2023-09-03 NOTE — Transitions of Care (Post Inpatient/ED Visit) (Signed)
09/03/2023  Name: Wanda Martinez MRN: [id number] DOB: [birth year]  Today's TOC FU Call Status: Today's TOC FU Call Status:: Successful TOC FU Call Completed TOC FU Call Complete Date: 09/03/23 Patient's Name and Date of Birth confirmed.  Transition Care Management Follow-up Telephone Call Date of Discharge: 08/29/23 Discharge Facility: Other [facility]) Name of Other (Non[facility]) Discharge Facility: [facility] Type of Discharge: Inpatient Admission Primary Inpatient Discharge Diagnosis:: abscess of gluteal region How have you been since you were released from the hospital?: Better Any questions or concerns?: Yes Patient Questions/Concerns:: Patient wanted appointment moved up since she had been in the hospital Patient Questions/Concerns Addressed: Other:  Items Reviewed: Did you receive and understand the discharge instructions provided?: Yes Medications obtained,verified, and reconciled?: Yes (Medications Reviewed) Any new allergies since your discharge?: No Dietary orders reviewed?: No Do you have support at home?: Yes People in Home: spouse Name of Support/Comfort Primary Source: Wanda Martinez  Medications Reviewed Today: Medications Reviewed Today     Reviewed by Wanda Cook, RN (Case Manager) on 09/03/23 at 1514  Med List Status: <None>   Medication Order Taking? Sig Documenting Provider Last Dose Status Informant  amLODipine (NORVASC) 2.5 MG tablet [id number] Yes Take 1 tablet (2.5 mg total) by mouth daily. Wanda [place], MD Taking Active   aspirin 81 MG EC tablet [id number] Yes Take 81 mg by mouth daily as needed. [provider] Taking Active   calcium carbonate (OSCAL) 1500 (600 Ca) MG TABS tablet [id number] Yes Take 1 tablet by mouth daily. [provider] Taking Active   ciprofloxacin (CIPRO) 500 MG tablet [id number] Yes Take 500 mg by mouth 2 (two) times daily. [provider] Taking Active   citalopram (CELEXA) 20 MG tablet  [id number] Yes TAKE 1 TABLET BY MOUTH ONCE DAILY. Wanda [name], MD Taking Active   cyclobenzaprine (FLEXERIL) 10 MG tablet [id number] Yes TAKE ONE TABLET BY MOUTH AT BEDTIME. Wanda [name], MD Taking Active   donepezil (ARICEPT) 10 MG tablet [id number] Yes TAKE ONE TABLET BY MOUTH AT BEDTIME Wanda [name], MD Taking Active   erythromycin ophthalmic ointment [id number] Yes Apply to eye. [provider] Taking Active   gabapentin (NEURONTIN) 300 MG capsule [id number] Yes Take 1 capsule (300 mg total) by mouth at bedtime. Wanda [name], MD Taking Active   Glucosamine-Chondroitin 500-400 MG CAPS [id number] Yes Take 2 capsules by mouth daily. [provider] Taking Active   hydrocortisone (ANUSOL-HC) 25 MG suppository [id number] Yes Place 1 suppository (25 mg total) rectally 2 (two) times daily. Wanda [name], MD Taking Active   letrozole [facility]) 2.5 MG tablet [id number] Yes Take 2.5 mg by mouth daily. [provider] Taking Active   losartan (COZAAR) 50 MG tablet [id number] Yes TAKE (2) TABLETS BY MOUTH ONCE DAILY. Wanda [name], MD Taking Active   lubiprostone (AMITIZA) 24 MCG capsule [id number] Yes TAKE 1 CAPSULE BY MOUTH ONCE DAILY WITH BREAKFAST. [provider] Taking Active            Med Note (GARNER, Wanda Martinez   Tue Jan 16, 2017 11:45 AM)    magnesium oxide (MAG-OX) 400 (240 Mg) MG tablet [id number] Yes TAKE ONE TABLET BY MOUTH EVERY DAY Wanda [name], MD Taking Active   melatonin 1 MG TABS tablet [id number] Yes Take 3 mg by mouth at bedtime. [provider] Taking Active   metoprolol succinate (TOPROL-XL) 25 MG 24 hr tablet [id number] Yes Take 1 tablet (25 mg total) by mouth 2 (two) times daily. Wanda [name], MD Taking  Active   mirabegron ER (MYRBETRIQ) 50 MG TB24 tablet [id number] Yes Take 1 tablet (50 mg total) by mouth daily. Wanda [place], MD Taking Active   Multiple Vitamin (MULTIVITAMIN) tablet [id number] Yes Take 1 tablet by mouth daily.  [provider] Taking Active   naloxone [facility]) nasal spray 4 mg/0.1 mL [id number] Yes Place 1 spray into the nose as needed for up to 365 doses (for opioid-induced respiratory depresssion). In case of emergency (overdose), spray once into each nostril. If no response within 3 minutes, repeat application and call 911. Wanda Metz, MD Taking Active   nitrofurantoin, macrocrystal-monohydrate, (MACROBID) 100 MG capsule [id number] Yes Take 1 capsule (100 mg total) by mouth daily. Wanda [place], MD Taking Active   Omega-3 1000 MG CAPS [id number] Yes Take by mouth. [provider] Taking Active   oxyCODONE (OXY IR/ROXICODONE) 5 MG immediate release tablet [id number] Yes Take 1 tablet (5 mg total) by mouth every 8 (eight) hours as needed for severe pain or breakthrough pain (PRN for post-radiofrequency pain). Must last 30 days. Wanda Metz, MD Taking Active   oxyCODONE (OXY IR/ROXICODONE) 5 MG immediate release tablet [id number] Yes Take 1 tablet (5 mg total) by mouth every 8 (eight) hours as needed for severe pain or breakthrough pain (PRN for post-radiofrequency pain). Must last 30 days. Wanda Metz, MD Taking Active   oxyCODONE (OXY IR/ROXICODONE) 5 MG immediate release tablet [id number] Yes Take 1 tablet (5 mg total) by mouth every 8 (eight) hours as needed for severe pain or breakthrough pain (PRN for post-radiofrequency pain). Must last 30 days. Wanda Metz, MD Taking Active   pantoprazole (PROTONIX) 40 MG tablet [id number] Yes Take 1 tablet (40 mg total) by mouth daily. Wanda [name], MD Taking Active   rosuvastatin (CRESTOR) 20 MG tablet [id number] Yes TAKE ONE TABLET BY MOUTH AT BEDTIME Wanda [name], MD Taking Active   Med List Note Wanda [name], RN 06/27/23 1318): UDS 06/27/23 MR 07/28/2023 Medication agreement renewed 2020            Home Care and Equipment/Supplies: Were Home Health Services Ordered?: Yes Name of Home Health Agency::  [facility] Has Agency set up a time to come to your home?: Yes First Home Health Visit Date: 09/03/23 Any new equipment or medical supplies ordered?: Yes Name of Medical supply agency?: [facility] Were you able to get the equipment/medical supplies?: Yes Do you have any questions related to the use of the equipment/supplies?: No  Functional Questionnaire: Do you need assistance with bathing/showering or dressing?: Yes Do you need assistance with meal preparation?: Yes Do you need assistance with eating?: No Do you have difficulty maintaining continence: No Do you need assistance with getting out of bed/getting out of a chair/moving?: No Do you have difficulty managing or taking your medications?: No  Follow up appointments reviewed: PCP Follow-up appointment confirmed?: Yes Date of PCP follow-up appointment?: 09/11/23 Follow-up Provider: Dr Lorin Picket [facility] Follow-up appointment confirmed?: NA Do you need transportation to your follow-up appointment?: No Do you understand care options if your condition(s) worsen?: Yes-patient verbalized understanding  SDOH Interventions Today    Flowsheet Row Most Recent Value  SDOH Interventions   Food Insecurity Interventions Intervention Not Indicated  Housing Interventions Intervention Not Indicated  Transportation Interventions Intervention Not Indicated      Interventions Today    Flowsheet Row Most Recent Value  General Interventions   General Interventions Discussed/Reviewed General Interventions Discussed, General Interventions Reviewed, Doctor Visits  Doctor Visits Discussed/Reviewed Doctor Visits Discussed, Doctor  Visits Reviewed, PCP  Exercise Interventions   Exercise Discussed/Reviewed Exercise Discussed  [[facility] is to start PT today]  Pharmacy Interventions   Pharmacy Dicussed/Reviewed Pharmacy Topics Discussed, Pharmacy Topics Reviewed      TOC Interventions Today    Flowsheet Row Most Recent Value  TOC  Interventions   TOC Interventions Discussed/Reviewed TOC Interventions Discussed, TOC Interventions Reviewed, Arranged PCP follow up less than 12 days/Care Guide scheduled, Post op wound/incision care  [Husband has helped patient change the dressings]        Gean Maidens BSN RN [facility] [phone]

## 2023-09-04 DIAGNOSIS — E7800 Pure hypercholesterolemia, unspecified: Secondary | ICD-10-CM | POA: Diagnosis not present

## 2023-09-04 DIAGNOSIS — L0231 Cutaneous abscess of buttock: Secondary | ICD-10-CM | POA: Diagnosis not present

## 2023-09-04 DIAGNOSIS — F419 Anxiety disorder, unspecified: Secondary | ICD-10-CM | POA: Diagnosis not present

## 2023-09-04 DIAGNOSIS — L03317 Cellulitis of buttock: Secondary | ICD-10-CM | POA: Diagnosis not present

## 2023-09-04 DIAGNOSIS — N1831 Chronic kidney disease, stage 3a: Secondary | ICD-10-CM | POA: Diagnosis not present

## 2023-09-04 DIAGNOSIS — F32A Depression, unspecified: Secondary | ICD-10-CM | POA: Diagnosis not present

## 2023-09-04 DIAGNOSIS — M810 Age-related osteoporosis without current pathological fracture: Secondary | ICD-10-CM | POA: Diagnosis not present

## 2023-09-04 DIAGNOSIS — I129 Hypertensive chronic kidney disease with stage 1 through stage 4 chronic kidney disease, or unspecified chronic kidney disease: Secondary | ICD-10-CM | POA: Diagnosis not present

## 2023-09-04 DIAGNOSIS — D631 Anemia in chronic kidney disease: Secondary | ICD-10-CM | POA: Diagnosis not present

## 2023-09-06 DIAGNOSIS — E7800 Pure hypercholesterolemia, unspecified: Secondary | ICD-10-CM | POA: Diagnosis not present

## 2023-09-06 DIAGNOSIS — L0231 Cutaneous abscess of buttock: Secondary | ICD-10-CM | POA: Diagnosis not present

## 2023-09-06 DIAGNOSIS — N1831 Chronic kidney disease, stage 3a: Secondary | ICD-10-CM | POA: Diagnosis not present

## 2023-09-06 DIAGNOSIS — M810 Age-related osteoporosis without current pathological fracture: Secondary | ICD-10-CM | POA: Diagnosis not present

## 2023-09-06 DIAGNOSIS — F419 Anxiety disorder, unspecified: Secondary | ICD-10-CM | POA: Diagnosis not present

## 2023-09-06 DIAGNOSIS — F32A Depression, unspecified: Secondary | ICD-10-CM | POA: Diagnosis not present

## 2023-09-06 DIAGNOSIS — I129 Hypertensive chronic kidney disease with stage 1 through stage 4 chronic kidney disease, or unspecified chronic kidney disease: Secondary | ICD-10-CM | POA: Diagnosis not present

## 2023-09-06 DIAGNOSIS — D631 Anemia in chronic kidney disease: Secondary | ICD-10-CM | POA: Diagnosis not present

## 2023-09-06 DIAGNOSIS — L03317 Cellulitis of buttock: Secondary | ICD-10-CM | POA: Diagnosis not present

## 2023-09-08 DIAGNOSIS — I129 Hypertensive chronic kidney disease with stage 1 through stage 4 chronic kidney disease, or unspecified chronic kidney disease: Secondary | ICD-10-CM | POA: Diagnosis not present

## 2023-09-08 DIAGNOSIS — L0231 Cutaneous abscess of buttock: Secondary | ICD-10-CM | POA: Diagnosis not present

## 2023-09-08 DIAGNOSIS — F419 Anxiety disorder, unspecified: Secondary | ICD-10-CM | POA: Diagnosis not present

## 2023-09-08 DIAGNOSIS — E7800 Pure hypercholesterolemia, unspecified: Secondary | ICD-10-CM | POA: Diagnosis not present

## 2023-09-08 DIAGNOSIS — M810 Age-related osteoporosis without current pathological fracture: Secondary | ICD-10-CM | POA: Diagnosis not present

## 2023-09-08 DIAGNOSIS — L03317 Cellulitis of buttock: Secondary | ICD-10-CM | POA: Diagnosis not present

## 2023-09-08 DIAGNOSIS — N1831 Chronic kidney disease, stage 3a: Secondary | ICD-10-CM | POA: Diagnosis not present

## 2023-09-08 DIAGNOSIS — F32A Depression, unspecified: Secondary | ICD-10-CM | POA: Diagnosis not present

## 2023-09-08 DIAGNOSIS — D631 Anemia in chronic kidney disease: Secondary | ICD-10-CM | POA: Diagnosis not present

## 2023-09-10 ENCOUNTER — Telehealth: Payer: Self-pay | Admitting: Pain Medicine

## 2023-09-10 NOTE — Telephone Encounter (Signed)
PT called stated that her pain is worse and wants to know if she can take her pain medication every 6 hours instead of every 8 to help with the pain. Please give patient a call.TY

## 2023-09-10 NOTE — Telephone Encounter (Signed)
Talked to patient, she wants to change medications to every 6 hours. Instructed her that he would not change medication that the amount given should last 30 days. Patient stes she was in the hospital and is hurting a lot all over. Instructed to make an appointment with Dr Laban Emperor and she states she could not at the time because of weakness and pain. I also instructed her to seek out to ED if needed.

## 2023-09-11 ENCOUNTER — Encounter: Payer: Self-pay | Admitting: Internal Medicine

## 2023-09-11 ENCOUNTER — Ambulatory Visit: Payer: Medicare HMO | Admitting: Internal Medicine

## 2023-09-11 ENCOUNTER — Other Ambulatory Visit: Payer: Self-pay | Admitting: Internal Medicine

## 2023-09-11 VITALS — BP 128/68 | HR 63 | Temp 97.8°F | Ht 64.0 in | Wt 171.0 lb

## 2023-09-11 DIAGNOSIS — G8929 Other chronic pain: Secondary | ICD-10-CM

## 2023-09-11 DIAGNOSIS — F320 Major depressive disorder, single episode, mild: Secondary | ICD-10-CM

## 2023-09-11 DIAGNOSIS — M545 Low back pain, unspecified: Secondary | ICD-10-CM

## 2023-09-11 DIAGNOSIS — E7800 Pure hypercholesterolemia, unspecified: Secondary | ICD-10-CM | POA: Diagnosis not present

## 2023-09-11 DIAGNOSIS — K219 Gastro-esophageal reflux disease without esophagitis: Secondary | ICD-10-CM

## 2023-09-11 DIAGNOSIS — R739 Hyperglycemia, unspecified: Secondary | ICD-10-CM

## 2023-09-11 DIAGNOSIS — L03317 Cellulitis of buttock: Secondary | ICD-10-CM

## 2023-09-11 DIAGNOSIS — D649 Anemia, unspecified: Secondary | ICD-10-CM | POA: Diagnosis not present

## 2023-09-11 DIAGNOSIS — L0231 Cutaneous abscess of buttock: Secondary | ICD-10-CM

## 2023-09-11 DIAGNOSIS — N1831 Chronic kidney disease, stage 3a: Secondary | ICD-10-CM

## 2023-09-11 DIAGNOSIS — I700 Atherosclerosis of aorta: Secondary | ICD-10-CM | POA: Diagnosis not present

## 2023-09-11 DIAGNOSIS — I10 Essential (primary) hypertension: Secondary | ICD-10-CM

## 2023-09-11 DIAGNOSIS — I471 Supraventricular tachycardia, unspecified: Secondary | ICD-10-CM

## 2023-09-11 DIAGNOSIS — M810 Age-related osteoporosis without current pathological fracture: Secondary | ICD-10-CM

## 2023-09-11 LAB — LIPID PANEL
Cholesterol: 112 mg/dL (ref 0–200)
HDL: 47.9 mg/dL (ref >39.00)
LDL Cholesterol: 45 mg/dL (ref 0–99)
NonHDL: 63.96
Total CHOL/HDL Ratio: 2
Triglycerides: 96 mg/dL (ref 0.0–149.0)
VLDL: 19.2 mg/dL (ref 0.0–40.0)

## 2023-09-11 LAB — HEPATIC FUNCTION PANEL
ALT: 19 U/L (ref 0–35)
AST: 25 U/L (ref 0–37)
Albumin: 3.8 g/dL (ref 3.5–5.2)
Alkaline Phosphatase: 32 U/L — ABNORMAL LOW (ref 39–117)
Bilirubin, Direct: 0.2 mg/dL (ref 0.0–0.3)
Total Bilirubin: 0.4 mg/dL (ref 0.2–1.2)
Total Protein: 7.3 g/dL (ref 6.0–8.3)

## 2023-09-11 LAB — CBC WITH DIFFERENTIAL/PLATELET
Basophils Absolute: 0.1 10*3/uL (ref 0.0–0.1)
Basophils Relative: 1.2 % (ref 0.0–3.0)
Eosinophils Absolute: 0.1 10*3/uL (ref 0.0–0.7)
Eosinophils Relative: 2 % (ref 0.0–5.0)
HCT: 31.6 % — ABNORMAL LOW (ref 36.0–46.0)
Hemoglobin: 10 g/dL — ABNORMAL LOW (ref 12.0–15.0)
Lymphocytes Relative: 19.3 % (ref 12.0–46.0)
Lymphs Abs: 1.1 10*3/uL (ref 0.7–4.0)
MCHC: 31.6 g/dL (ref 30.0–36.0)
MCV: 93.8 fl (ref 78.0–100.0)
Monocytes Absolute: 0.5 10*3/uL (ref 0.1–1.0)
Monocytes Relative: 9.1 % (ref 3.0–12.0)
Neutro Abs: 4.1 10*3/uL (ref 1.4–7.7)
Neutrophils Relative %: 68.4 % (ref 43.0–77.0)
Platelets: 261 10*3/uL (ref 150.0–400.0)
RBC: 3.37 Mil/uL — ABNORMAL LOW (ref 3.87–5.11)
RDW: 16 % — ABNORMAL HIGH (ref 11.5–15.5)
WBC: 5.9 10*3/uL (ref 4.0–10.5)

## 2023-09-11 LAB — BASIC METABOLIC PANEL
BUN: 23 mg/dL (ref 6–23)
CO2: 27 mEq/L (ref 19–32)
Calcium: 9.2 mg/dL (ref 8.4–10.5)
Chloride: 99 mEq/L (ref 96–112)
Creatinine, Ser: 1 mg/dL (ref 0.40–1.20)
GFR: 51.23 mL/min — ABNORMAL LOW (ref >60.00)
Glucose, Bld: 81 mg/dL (ref 70–99)
Potassium: 3.8 mEq/L (ref 3.5–5.1)
Sodium: 134 mEq/L — ABNORMAL LOW (ref 135–145)

## 2023-09-11 LAB — IBC + FERRITIN
Ferritin: 134.7 ng/mL (ref 10.0–291.0)
Iron: 33 ug/dL — ABNORMAL LOW (ref 42–145)
Saturation Ratios: 11.8 % — ABNORMAL LOW (ref 20.0–50.0)
TIBC: 280 ug/dL (ref 250.0–450.0)
Transferrin: 200 mg/dL — ABNORMAL LOW (ref 212.0–360.0)

## 2023-09-11 LAB — TSH: TSH: 2.86 u[IU]/mL (ref 0.35–5.50)

## 2023-09-11 LAB — HEMOGLOBIN A1C: Hgb A1c MFr Bld: 5.3 % (ref 4.6–6.5)

## 2023-09-11 IMAGING — MR MR HIP*R* W/O CM
5 series · 38 of 40 positions shown · non-contrast
Comparison: X-ray 12/30/2019

CLINICAL DATA: Hip pain, chronic, articular cartilage eval, xray
done Hip pain, chronic, osteoarthritis suspected

EXAM:
MR OF THE RIGHT HIP WITHOUT CONTRAST
TECHNIQUE: Multiplanar, multisequence MR imaging was performed. No intravenous
contrast was administered.

[Series 5: T1 · coronal · right · 4.0mm · 1.19mm/px · 9 of 40 slices shown]
[im 1/40]
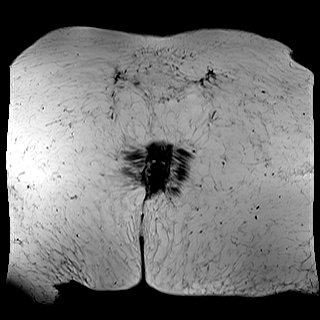
[im 5/40]
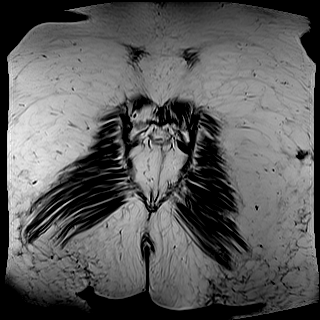
[im 10/40]
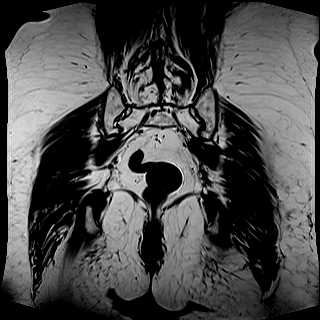
[im 15/40]
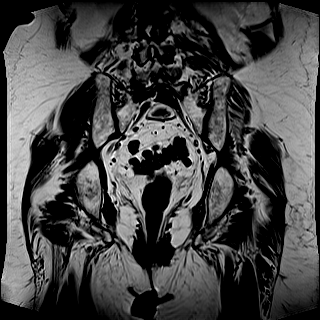
[im 20/40]
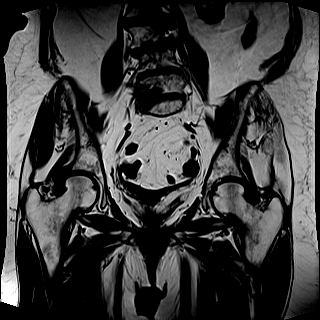
[im 25/40]
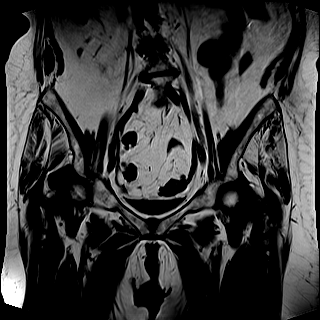
[im 30/40]
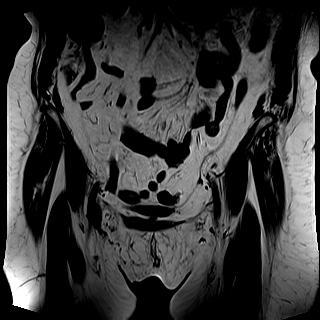
[im 35/40]
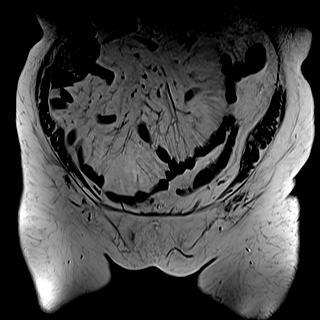
[im 40/40]
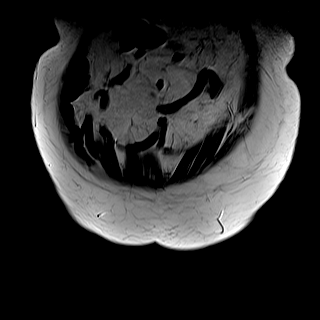

[Series 6: STIR · coronal · right · 4.0mm · 1.19mm/px · 8 of 40 slices shown]
[im 1/40]
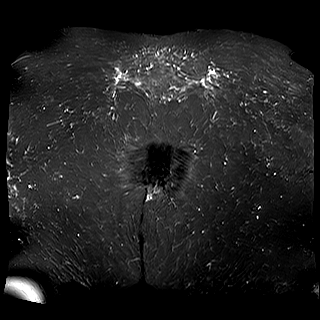
[im 5/40]
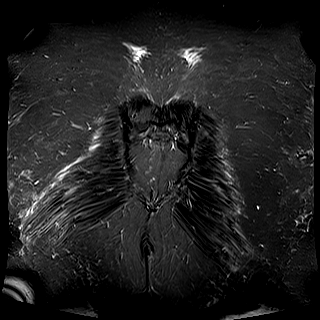
[im 14/40]
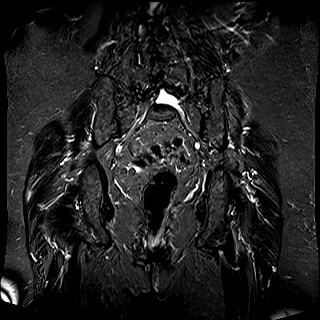
[im 18/40]
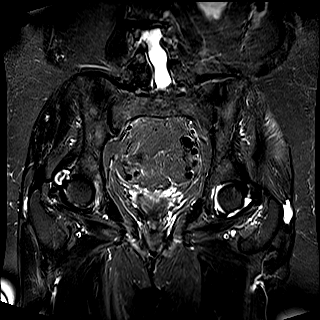
[im 22/40]
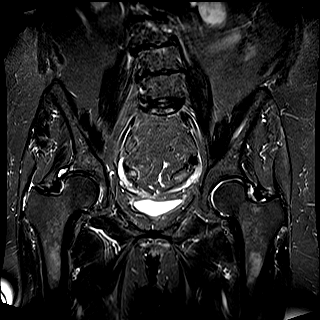
[im 27/40]
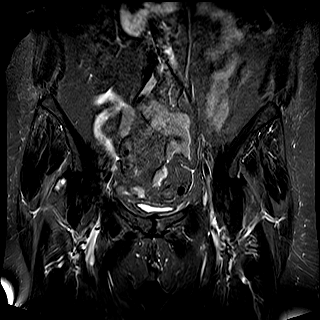
[im 35/40]
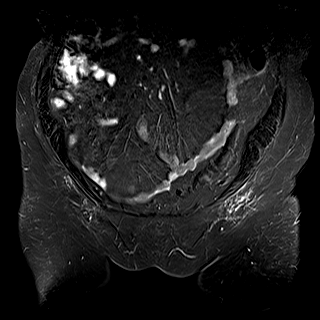
[im 40/40]
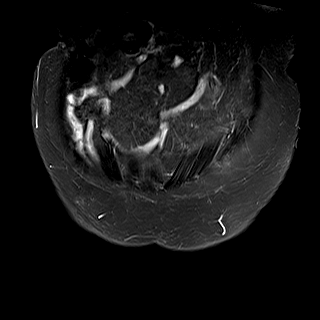

[Series 7: T2 fat-sat · axial · right · 4.0mm · 0.70mm/px · z∈[-125,+20]mm · 7 of 30 slices shown]
[im 1/30]
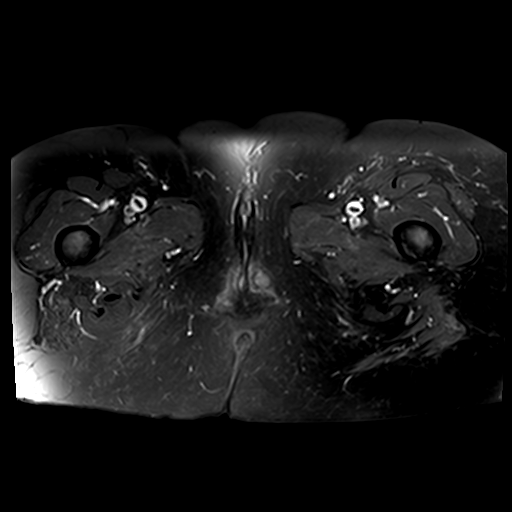
[im 5/30]
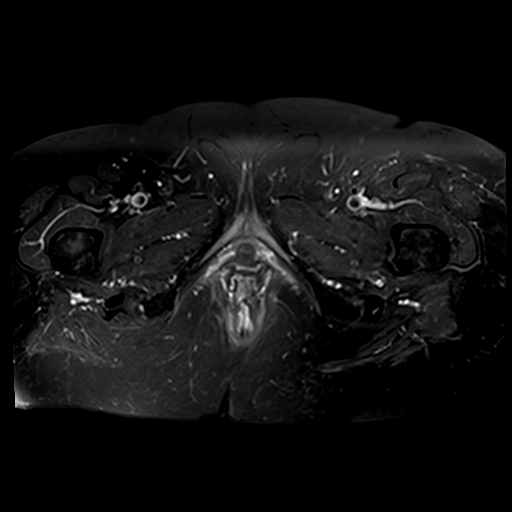
[im 10/30]
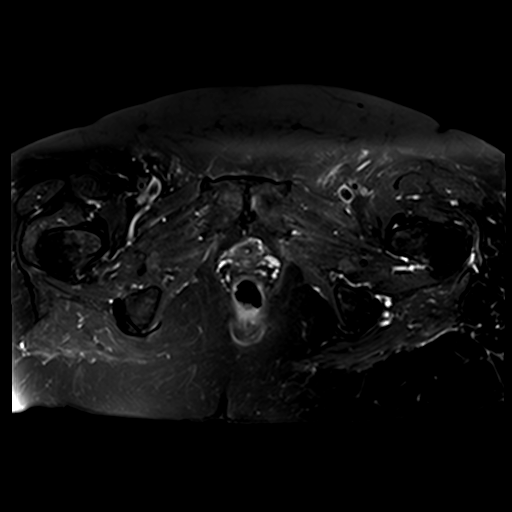
[im 15/30]
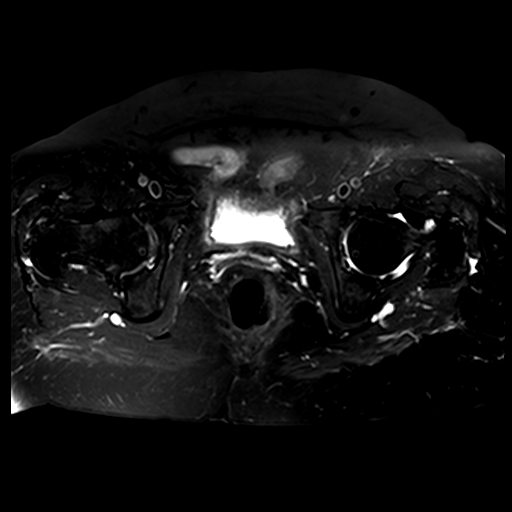
[im 20/30]
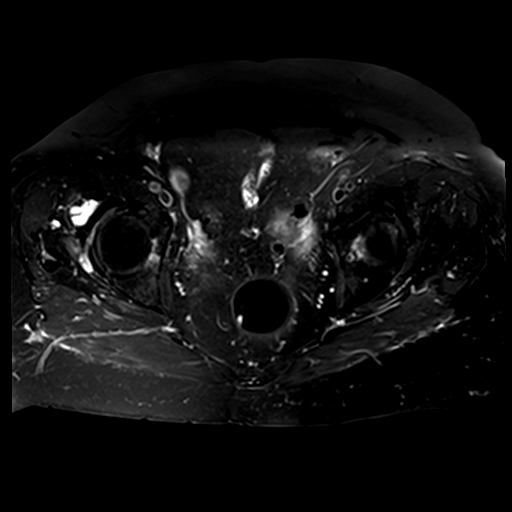
[im 25/30]
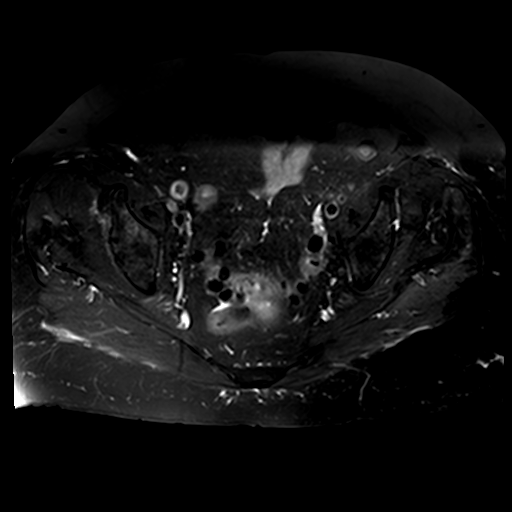
[im 30/30]
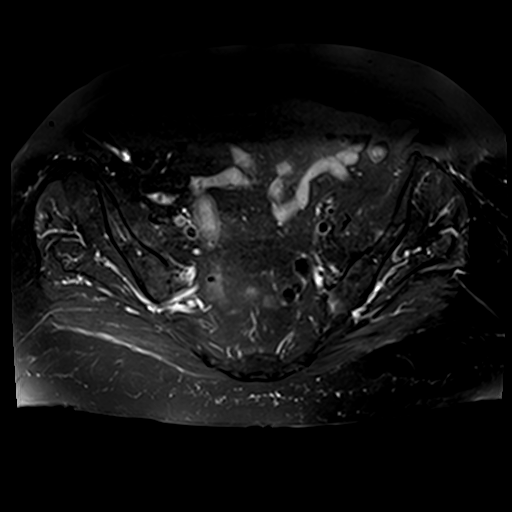

[Series 8: PD fat-sat · sagittal · right · 4.0mm · 0.70mm/px · 7 of 29 slices shown (1 of 2)]
[im 1/29]
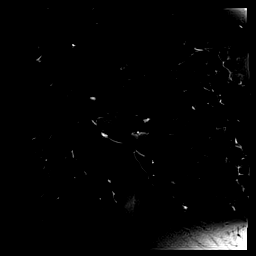
[im 5/29]
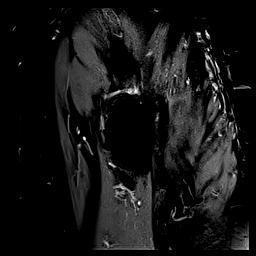
[im 10/29]
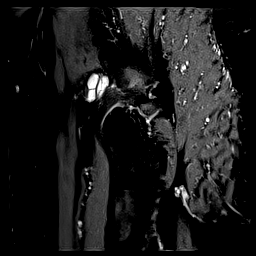
[im 15/29]
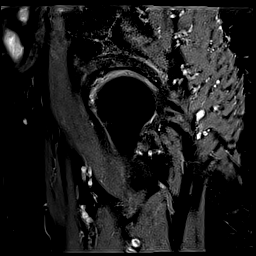
[im 19/29]
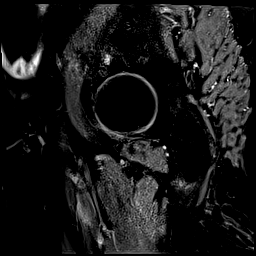
[im 24/29]
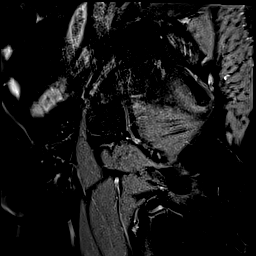
[im 29/29]
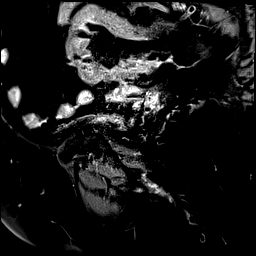

[Series 9: PD fat-sat · coronal · right · 4.0mm · 0.70mm/px · 7 of 29 slices shown (2 of 2)]
[im 1/29]
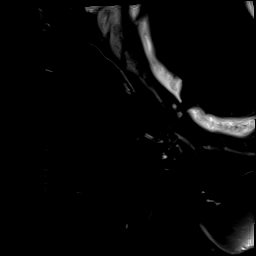
[im 5/29]
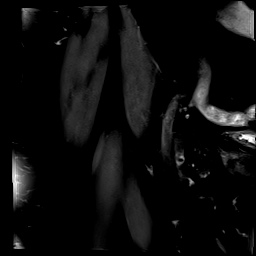
[im 10/29]
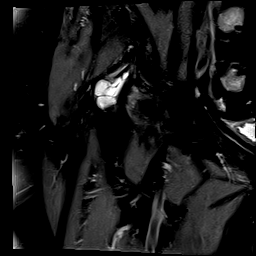
[im 15/29]
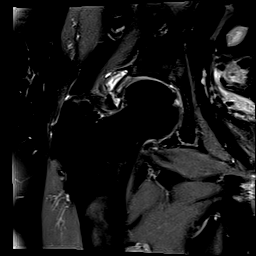
[im 19/29]
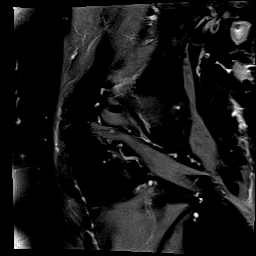
[im 24/29]
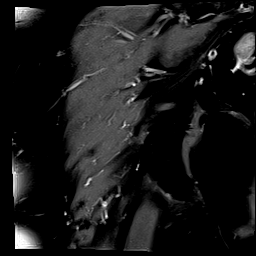
[im 29/29]
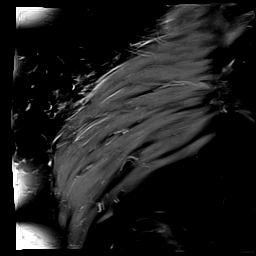

[38 of 40 positions shown; findings below may reference images not displayed]

FINDINGS: Bones: No acute fracture. No dislocation. No femoral head avascular
necrosis. Mild osteoarthritis of the left hip with small left hip
joint effusion. Bony pelvis intact without diastasis. SI joints and
pubic symphysis within normal limits. Advanced multilevel lumbar
spondylosis not well characterized on large field-of-view coronal
images of the pelvis. No bone marrow edema. No marrow replacing bone
lesion.

Articular cartilage and labrum

Articular cartilage: Mild-moderate chondral thinning and surface
irregularity, most pronounced superiorly. Mild reactive subchondral
marrow signal changes in the superior acetabulum.

Labrum: Extensive superior labral tear with large multilobulated
complex paralabral cyst measuring 5.3 x 2.0 x 3.1 cm.

Joint or bursal effusion

Joint effusion:  No right hip joint effusion.

Bursae: Small left and trace right peritrochanteric bursal fluid
collections.

Muscles and tendons

Muscles and tendons: Tendinosis and partial-thickness insertional
tearing of the bilateral gluteus minimus tendons. The gluteus
medius, hamstring, iliopsoas, rectus femoris, and adductor tendons
appear intact without tear or significant tendinosis. Normal muscle
bulk and signal intensity without edema, atrophy, or fatty
infiltration.

Other findings

Miscellaneous: No soft tissue edema or fluid collection. No inguinal
lymphadenopathy. Sigmoid diverticulosis.
IMPRESSION: 1. Mild-to-moderate osteoarthritis of the bilateral hips, right
worse than left.
2. Extensive right-sided superior labral tear with large complex
paralabral cyst measuring up to 5.3 cm.
3. Tendinosis and partial-thickness insertional tearing of the
bilateral gluteus minimus tendons.
4. Small left and trace right peritrochanteric bursal fluid
collections.

## 2023-09-11 MED ORDER — AMOXICILLIN-POT CLAVULANATE 875-125 MG PO TABS: 1.0000 | ORAL_TABLET | Freq: Two times a day (BID) | ORAL | 0 refills | Status: DC

## 2023-09-11 NOTE — Progress Notes (Signed)
Subjective:    Patient ID: Wanda Martinez, female    DOB: 18-Apr-1935, 88 y.o.   MRN: [id number]  Patient here for  Chief Complaint  Patient presents with   Hospitalization Follow-up    HPI Here for hospital follow up.  Admitted from 08/22/23 - 08/29/23 - abscess and cellulitis of gluteal region. Initially treated with cefepime-clinda in ED. Then transitioned to Vanc-Cefepime. Added metronidazole 9/8, stopped vancomycin w/ neg MRSA screen. Transitioned cefepime to keflex 08/28/23.  Metronidazole was continued. Discharged on this regimen to complete 10 days. Also had painful lesion - right labia major. The labial lesion has resolved. The abscess/cellulitis - gluteal region - improved, but still tender and some fullness/pressure sensation.  No fever.  Eating.  No vomiting.  No diarrhea.  She is having some increased pain in her knees, shoulder and back.  Has chronic pain issues and is being followed by pain clinic.  Feels her chronic pain has been aggravated by being in the hospital, etc.     Past Medical History:  Diagnosis Date   Acute postoperative pain 10/25/2017   Anemia    Arm pain 07/26/2015   Arthritis    Arthritis, degenerative 03/26/2014   Back pain 11/01/2013   Bladder infection 06/2018   Breast cancer (HCC)    Masectomy - left - 1986    Breast cancer [facility])    Mastectomy-right -2014   CHEST PAIN 04/29/2010   Qualifier: Diagnosis of  By: Laural Benes RN, Erika     Chronic cystitis    Cystocele 02/22/2013   Cystocele, midline 08/19/2013   Degeneration of intervertebral disc of lumbosacral region 03/26/2014   DYSPNEA 04/29/2010   Qualifier: Diagnosis of  By: Laural Benes RN, Erika     Enthesopathy of hip 03/26/2014   GERD (gastroesophageal reflux disease)    Hiatal hernia    HTN (hypertension)    Hypokalemia 06/2018   Hyponatremia 06/2018   LBP (low back pain) 03/26/2014   Neck pain 11/01/2013   Parkinson disease    Pneumonia 06/2018   Sinusitis 02/07/2015   Skin lesions 07/12/2014    Urinary incontinence    mixed    Past Surgical History:  Procedure Laterality Date   ABDOMINAL HYSTERECTOMY     BLEPHAROPLASTY     BREAST BIOPSY  2013   BREAST ENHANCEMENT SURGERY  1987   BREAST IMPLANT REMOVAL     BREAST IMPLANT REMOVAL Right 08/29/2012   BREAST SURGERY  1986   s/p left mastectomy   COLONOSCOPY WITH PROPOFOL N/A 09/13/2016   Procedure: COLONOSCOPY WITH PROPOFOL;  Surgeon: Scot Jun, MD;  Location: [facility] ENDOSCOPY;  Service: Endoscopy;  Laterality: N/A;   MASTECTOMY  08/2012   right   TONSILLECTOMY AND ADENOIDECTOMY  1979   VESICOVAGINAL FISTULA CLOSURE W/ TAH  1983   Family History  Problem Relation Age of Onset   Diabetes Father    Stroke Father    Colon polyps Father    Stroke Mother    Parkinson's disease Mother    Social History   Socioeconomic History   Marital status: Married    Spouse name: Not on file   Martinez of children: 3   Years of education: Not on file   Highest education level: Not on file  Occupational History   Not on file  Tobacco Use   Smoking status: Never    Passive exposure: Never   Smokeless tobacco: Never   Tobacco comments:    tobacco use - no  Vaping Use  Vaping status: Never Used  Substance and Sexual Activity   Alcohol use: No    Alcohol/week: 0.0 standard drinks of alcohol   Drug use: No   Sexual activity: Never  Other Topics Concern   Not on file  Social History Narrative   Not on file   Social Determinants of Health   Financial Resource Strain: Low Risk  (02/26/2023)   Received from [place]   Overall Financial Resource Strain (CARDIA)    Difficulty of Paying Living Expenses: Not hard at all  Food Insecurity: No Food Insecurity (09/03/2023)   Hunger Vital Sign    Worried About Running Out of Food in the Last Year: Never true    Ran Out of Food in the Last Year: Never true  Transportation Needs: No Transportation Needs (09/03/2023)   PRAPARE - Scientist, research (physical sciences) (Medical): No    Lack of Transportation (Non-Medical): No  Physical Activity: Not on file  Stress: No Stress Concern Present (07/12/2020)   [employer] of Occupational Health - Occupational Stress Questionnaire    Feeling of Stress : Not at all  Social Connections: Socially Integrated (07/12/2020)   Social Connection and Isolation Panel [NHANES]    Frequency of Communication with Friends and Family: Three times a week    Frequency of Social Gatherings with Friends and Family: More than three times a week    Attends Religious Services: More than 4 times per year    Active Member of Clubs or Organizations: Yes    Attends Banker Meetings: 1 to 4 times per year    Marital Status: Married     Review of Systems  Constitutional:  Negative for appetite change, fever and unexpected weight change.  HENT:  Negative for congestion and sinus pressure.   Respiratory:  Negative for cough, chest tightness and shortness of breath.   Cardiovascular:  Negative for chest pain and palpitations.  Gastrointestinal:  Negative for abdominal pain, diarrhea, nausea and vomiting.  Genitourinary:  Negative for difficulty urinating and dysuria.  Musculoskeletal:  Positive for back pain. Negative for myalgias.  Skin:  Negative for color change and rash.  Neurological:  Negative for dizziness and headaches.  Psychiatric/Behavioral:  Negative for agitation and dysphoric mood.        Objective:     BP 128/68   Pulse 63   Temp 97.8 F (36.6 C) (Oral)   Ht 5\' 4"  (1.626 m)   Wt 171 lb (77.6 kg)   LMP 12/18/1981   SpO2 97%   BMI 29.35 kg/m  Wt Readings from Last 3 Encounters:  09/11/23 171 lb (77.6 kg)  08/15/23 174 lb 12.8 oz (79.3 kg)  07/31/23 173 lb (78.5 kg)    Physical Exam Vitals reviewed.  Constitutional:      General: She is not in acute distress.    Appearance: Normal appearance.  HENT:     Head: Normocephalic and atraumatic.     Right Ear: External ear  normal.     Left Ear: External ear normal.  Eyes:     General: No scleral icterus.       Right eye: No discharge.        Left eye: No discharge.     Conjunctiva/sclera: Conjunctivae normal.  Neck:     Thyroid: No thyromegaly.  Cardiovascular:     Rate and Rhythm: Normal rate and regular rhythm.  Pulmonary:     Effort: No respiratory distress.  Breath sounds: Normal breath sounds. No wheezing.  Abdominal:     General: Bowel sounds are normal.     Palpations: Abdomen is soft.     Tenderness: There is no abdominal tenderness.  Musculoskeletal:        General: No swelling or tenderness.     Cervical back: Neck supple. No tenderness.  Lymphadenopathy:     Cervical: No cervical adenopathy.  Skin:    Comments: Right buttock - abscess/cellulitis - improved - still with some erythema and soft tissue fullness. Minimal tenderness to palpation.    Neurological:     Mental Status: She is alert.  Psychiatric:        Mood and Affect: Mood normal.        Behavior: Behavior normal.      Outpatient Encounter Medications as of 09/11/2023  Medication Sig   amLODipine (NORVASC) 2.5 MG tablet Take 1 tablet (2.5 mg total) by mouth daily.   amoxicillin-clavulanate (AUGMENTIN) 875-125 MG tablet Take 1 tablet by mouth 2 (two) times daily.   aspirin 81 MG EC tablet Take 81 mg by mouth daily as needed.   calcium carbonate (OSCAL) 1500 (600 Ca) MG TABS tablet Take 1 tablet by mouth daily.   citalopram (CELEXA) 20 MG tablet TAKE 1 TABLET BY MOUTH ONCE DAILY.   cyclobenzaprine (FLEXERIL) 10 MG tablet TAKE ONE TABLET BY MOUTH AT BEDTIME.   donepezil (ARICEPT) 10 MG tablet TAKE ONE TABLET BY MOUTH AT BEDTIME   gabapentin (NEURONTIN) 300 MG capsule Take 1 capsule (300 mg total) by mouth at bedtime.   Glucosamine-Chondroitin 500-400 MG CAPS Take 2 capsules by mouth daily.   hydrochlorothiazide (HYDRODIURIL) 12.5 MG tablet Take 12.5 mg by mouth every morning.   letrozole (FEMARA) 2.5 MG tablet Take 2.5  mg by mouth daily.   losartan (COZAAR) 50 MG tablet TAKE (2) TABLETS BY MOUTH ONCE DAILY.   lubiprostone (AMITIZA) 24 MCG capsule TAKE 1 CAPSULE BY MOUTH ONCE DAILY WITH BREAKFAST.   magnesium oxide (MAG-OX) 400 (240 Mg) MG tablet TAKE ONE TABLET BY MOUTH EVERY DAY   melatonin 1 MG TABS tablet Take 3 mg by mouth at bedtime.   metoprolol succinate (TOPROL-XL) 25 MG 24 hr tablet Take 1 tablet (25 mg total) by mouth 2 (two) times daily.   mirabegron ER (MYRBETRIQ) 50 MG TB24 tablet Take 1 tablet (50 mg total) by mouth daily.   Multiple Vitamin (MULTIVITAMIN) tablet Take 1 tablet by mouth daily.   naloxone (NARCAN) nasal spray 4 mg/0.1 mL Place 1 spray into the nose as needed for up to 365 doses (for opioid-induced respiratory depresssion). In case of emergency (overdose), spray once into each nostril. If no response within 3 minutes, repeat application and call 911.   nitrofurantoin, macrocrystal-monohydrate, (MACROBID) 100 MG capsule Take 1 capsule (100 mg total) by mouth daily.   Omega-3 1000 MG CAPS Take by mouth.   oxyCODONE (OXY IR/ROXICODONE) 5 MG immediate release tablet Take 1 tablet (5 mg total) by mouth every 8 (eight) hours as needed for severe pain or breakthrough pain (PRN for post-radiofrequency pain). Must last 30 days.   [START ON 09/15/2023] oxyCODONE (OXY IR/ROXICODONE) 5 MG immediate release tablet Take 1 tablet (5 mg total) by mouth every 8 (eight) hours as needed for severe pain or breakthrough pain (PRN for post-radiofrequency pain). Must last 30 days.   [START ON 10/15/2023] oxyCODONE (OXY IR/ROXICODONE) 5 MG immediate release tablet Take 1 tablet (5 mg total) by mouth every 8 (eight) hours as  needed for severe pain or breakthrough pain (PRN for post-radiofrequency pain). Must last 30 days.   rosuvastatin (CRESTOR) 20 MG tablet TAKE ONE TABLET BY MOUTH AT BEDTIME   [DISCONTINUED] pantoprazole (PROTONIX) 40 MG tablet Take 1 tablet (40 mg total) by mouth daily.   citalopram (CELEXA)  10 MG tablet Take 10 mg by mouth daily.   [DISCONTINUED] ciprofloxacin (CIPRO) 500 MG tablet Take 500 mg by mouth 2 (two) times daily. (Patient not taking: Reported on 09/11/2023)   [DISCONTINUED] erythromycin ophthalmic ointment Apply to eye. (Patient not taking: Reported on 09/11/2023)   [DISCONTINUED] hydrocortisone (ANUSOL-HC) 25 MG suppository Place 1 suppository (25 mg total) rectally 2 (two) times daily. (Patient not taking: Reported on 09/11/2023)   No facility-administered encounter medications on file as of 09/11/2023.     Lab Results  Component Value Date   WBC 5.9 09/11/2023   HGB 10.0 (L) 09/11/2023   HCT 31.6 (L) 09/11/2023   PLT 261.0 09/11/2023   GLUCOSE 81 09/11/2023   CHOL 112 09/11/2023   TRIG 96.0 09/11/2023   HDL 47.90 09/11/2023   LDLDIRECT 73.0 07/09/2018   LDLCALC 45 09/11/2023   ALT 19 09/11/2023   AST 25 09/11/2023   NA 134 (L) 09/11/2023   K 3.8 09/11/2023   CL 99 09/11/2023   CREATININE 1.00 09/11/2023   BUN 23 09/11/2023   CO2 27 09/11/2023   TSH 2.86 09/11/2023   HGBA1C 5.3 09/11/2023    ECHOCARDIOGRAM COMPLETE  Result Date: 07/10/2023    ECHOCARDIOGRAM REPORT   Patient Name:   Wanda Martinez Date of Exam: 07/10/2023 Medical Rec #:  [id number]           Height:       64.0 in Accession #:    [id number]          Weight:       173.0 lb Date of Birth:  1935/09/02          BSA:          1.839 m Patient Age:    87 years            BP:           112/62 mmHg Patient Gender: F                   HR:           50 bpm. Exam Location:  [place] Procedure: 2D Echo, Cardiac Doppler, Color Doppler and Intracardiac            Opacification Agent Indications:    I47.1 SVT; R53.83 Fatigue; R60.0 Lower extremity edema; R06.02                 SOB  History:        Patient has no prior history of Echocardiogram examinations.                 Arrythmias:SVT, Signs/Symptoms:Fatigue, Edema and Shortness of                 Breath; Risk Factors:Non-Smoker.  Sonographer:    Quentin Ore RDMS, RVT, RDCS Referring Phys: [id number] BRIAN AGBOR-ETANG IMPRESSIONS  1. Left ventricular ejection fraction, by estimation, is 55 to 60%. The left ventricle has normal function. The left ventricle has no regional wall motion abnormalities. There is mild left ventricular hypertrophy. Left ventricular diastolic parameters are indeterminate.  2. Right ventricular systolic function is normal. The right ventricular size is normal.  3. There  is no evidence of cardiac tamponade.  4. The mitral valve is normal in structure. No evidence of mitral valve regurgitation. No evidence of mitral stenosis.  5. The aortic valve has an indeterminant Martinez of cusps. Aortic valve regurgitation is not visualized. No aortic stenosis is present.  6. The inferior vena cava is normal in size with greater than 50% respiratory variability, suggesting right atrial pressure of 3 mmHg. FINDINGS  Left Ventricle: Left ventricular ejection fraction, by estimation, is 55 to 60%. The left ventricle has normal function. The left ventricle has no regional wall motion abnormalities. Definity contrast agent was given IV to delineate the left ventricular  endocardial borders. The left ventricular internal cavity size was normal in size. There is mild left ventricular hypertrophy. Left ventricular diastolic parameters are indeterminate. Right Ventricle: The right ventricular size is normal. No increase in right ventricular wall thickness. Right ventricular systolic function is normal. Left Atrium: Left atrial size was normal in size. Right Atrium: Right atrial size was normal in size. Pericardium: Trivial pericardial effusion is present. The pericardial effusion is circumferential. There is no evidence of cardiac tamponade. Mitral Valve: The mitral valve is normal in structure. No evidence of mitral valve regurgitation. No evidence of mitral valve stenosis. Tricuspid Valve: The tricuspid valve is normal in structure. Tricuspid valve regurgitation  is not demonstrated. No evidence of tricuspid stenosis. Aortic Valve: The aortic valve has an indeterminant Martinez of cusps. Aortic valve regurgitation is not visualized. No aortic stenosis is present. Aortic valve mean gradient measures 4.5 mmHg. Aortic valve peak gradient measures 12.0 mmHg. Aortic valve area, by VTI measures 1.71 cm. Pulmonic Valve: The pulmonic valve was normal in structure. Pulmonic valve regurgitation is not visualized. No evidence of pulmonic stenosis. Aorta: The aortic root is normal in size and structure. Venous: The inferior vena cava is normal in size with greater than 50% respiratory variability, suggesting right atrial pressure of 3 mmHg. IAS/Shunts: No atrial level shunt detected by color flow Doppler.  LEFT VENTRICLE PLAX 2D LVOT diam:     1.80 cm LV SV:         61 LV SV Index:   33 LVOT Area:     2.54 cm  RIGHT VENTRICLE             IVC RV Basal diam:  3.70 cm     IVC diam: 2.00 cm RV Mid diam:    3.90 cm RV S prime:     13.40 cm/s TAPSE (M-mode): 2.4 cm LEFT ATRIUM           Index        RIGHT ATRIUM           Index LA diam:      3.90 cm 2.12 cm/m   RA Area:     12.40 cm LA Vol (A4C): 32.7 ml 17.78 ml/m  RA Volume:   28.60 ml  15.55 ml/m  AORTIC VALVE                     PULMONIC VALVE AV Area (Vmax):    1.44 cm      PV Vmax:       0.68 m/s AV Area (Vmean):   1.45 cm      PV Peak grad:  1.9 mmHg AV Area (VTI):     1.71 cm AV Vmax:           173.00 cm/s AV Vmean:  113.000 cm/s AV VTI:            0.358 m AV Peak Grad:      12.0 mmHg AV Mean Grad:      4.5 mmHg LVOT Vmax:         97.85 cm/s LVOT Vmean:        64.200 cm/s LVOT VTI:          0.240 m LVOT/AV VTI ratio: 0.67  AORTA Ao Root diam: 3.00 cm Ao Asc diam:  3.40 cm Ao Arch diam: 3.1 cm MITRAL VALVE MV Area (PHT): 3.99 cm     SHUNTS MV Decel Time: 190 msec     Systemic VTI:  0.24 m MV E velocity: 119.00 cm/s  Systemic Diam: 1.80 cm MV A velocity: 72.30 cm/s MV E/A ratio:  1.65 Julien Nordmann MD Electronically  signed by Julien Nordmann MD Signature Date/Time: 07/10/2023/5:03:33 PM    Final    DG Bone Density  Result Date: 07/09/2023 EXAM: DUAL X-RAY ABSORPTIOMETRY (DXA) FOR BONE MINERAL DENSITY IMPRESSION: Your patient Wanda Martinez completed a BMD test on 07/09/2023 using the [employer] iDXA DXA System (software version: 14.10) manufactured by [employer]. The following summarizes the results of our evaluation. Technologist:VLM PATIENT BIOGRAPHICAL: Name: Wanda Martinez, Wanda Martinez Patient ID: [id number] Birth Date: 1935/05/18 Height: 64.0 in. Gender: Female Exam Date: 07/09/2023 Weight: 173.0 lbs. Indications: Advanced Age, High Risk Meds, Breast CA, Hysterectomy, Postmenopausal, Parkinson's disease, Caucasian, Osteoarthritis, History of kidney disease, Height Loss, History of Breast Cancer Fractures: Treatments: Vitamin D, Omeprazole, letrozole, Multi-Vitamin, Calcium, Gabapentin, Protonix DENSITOMETRY RESULTS: Site         Region      Measured Date Measured Age WHO Classification Young Adult T-score BMD         %Change vs. Previous Significant Change (*) DualFemur Total Right 07/09/2023 85.7 Osteopenia -2.4 0.710 g/cm2 -3.9% Yes DualFemur Total Right 07/05/2022 84.7 Osteopenia -2.1 0.739 g/cm2 -2.8% Yes DualFemur Total Right 07/04/2021 83.7 Osteopenia -2.0 0.760 g/cm2 1.7% - DualFemur Total Right 07/01/2020 82.7 Osteopenia -2.1 0.747 g/cm2 6.0% Yes DualFemur Total Right 07/01/2019 81.7 Osteopenia -2.4 0.705 g/cm2 -5.0% Yes DualFemur Total Right 07/10/2018 80.7 Osteopenia -2.1 0.742 g/cm2 -0.1% - DualFemur Total Right 06/25/2017 79.6 Osteopenia -2.1 0.743 g/cm2 -0.3% - DualFemur Total Right 06/21/2016 78.6 Osteopenia -2.1 0.745 g/cm2 -1.8% - DualFemur Total Right 06/15/2015 77.6 Osteopenia -2.0 0.759 g/cm2 -4.9% Yes DualFemur Total Right 08/21/2013 75.8 Osteopenia -1.7 0.798 g/cm2 - - DualFemur Total Mean 07/09/2023 85.7 Osteopenia -2.1 0.738 g/cm2 -2.8% Yes DualFemur Total Mean 07/05/2022 84.7 Osteopenia -2.0 0.759 g/cm2  0.3% - DualFemur Total Mean 07/04/2021 83.7 Osteopenia -2.0 0.757 g/cm2 1.2% - DualFemur Total Mean 07/01/2020 82.7 Osteopenia -2.1 0.748 g/cm2 8.7% Yes DualFemur Total Mean 07/01/2019 81.7 Osteoporosis -2.5 0.688 g/cm2 -7.4% Yes DualFemur Total Mean 07/10/2018 80.7 Osteopenia -2.1 0.743 g/cm2 1.2% - DualFemur Total Mean 06/25/2017 79.6 Osteopenia -2.2 0.734 g/cm2 -1.1% - DualFemur Total Mean 06/21/2016 78.6 Osteopenia -2.1 0.742 g/cm2 -1.9% - DualFemur Total Mean 06/15/2015 77.6 Osteopenia -2.0 0.756 g/cm2 -4.2% Yes DualFemur Total Mean 08/21/2013 75.8 Osteopenia -1.7 0.789 g/cm2 - - Left Forearm Radius 33% 07/09/2023 85.7 Osteoporosis -2.7 0.643 g/cm2 0.8% - Left Forearm Radius 33% 07/05/2022 84.7 Osteoporosis -2.7 0.638 g/cm2 -1.1% - Left Forearm Radius 33% 07/04/2021 83.7 Osteoporosis -2.6 0.645 g/cm2 3.4% - Left Forearm Radius 33% 07/01/2020 82.7 Osteoporosis -2.9 0.624 g/cm2 1.5% - Left Forearm Radius 33% 07/01/2019 81.7 Osteoporosis -3.0 0.615 g/cm2 -4.9% - Left Forearm Radius 33% 07/10/2018 80.7 Osteoporosis -2.6  0.647 g/cm2 -6.9% Yes Left Forearm Radius 33% 06/25/2017 79.6 Osteopenia -2.1 0.695 g/cm2 -2.4% - Left Forearm Radius 33% 06/21/2016 78.6 Osteopenia -1.9 0.712 g/cm2 11.6% Yes Left Forearm Radius 33% 06/15/2015 77.6 Osteoporosis -2.7 0.638 g/cm2 -11.9% Yes Left Forearm Radius 33% 08/21/2013 75.8 Osteopenia -1.7 0.724 g/cm2 - - ASSESSMENT: The BMD measured at Forearm Radius 33% is 0.643 g/cm2 with a T-score of -2.7. This patient is considered osteoporotic according to World Health Organization [facility]) criteria. Lumbar spine was not utilized due to advanced degenerative changes. Compared with prior study, there has been significant decrease in the total hip. The scan quality is good. World Science writer [facility]) criteria for post-menopausal, Caucasian Women: Normal:                   T-score at or above -1 SD Osteopenia/low bone mass: T-score between -1 and -2.5 SD Osteoporosis:             T-score  at or below -2.5 SD RECOMMENDATIONS: 1. All patients should optimize calcium and vitamin D intake. 2. Consider FDA-approved medical therapies in postmenopausal women and men aged 51 years and older, based on the following: a. A hip or vertebral(clinical or morphometric) fracture b. T-score < -2.5 at the femoral neck or spine after appropriate evaluation to exclude secondary causes c. Low bone mass (T-score between -1.0 and -2.5 at the femoral neck or spine) and a 10-year probability of a hip fracture > 3% or a 10-year probability of a major osteoporosis-related fracture > 20% based on the [place]-adapted WHO algorithm 3. Clinician judgment and/or patient preferences may indicate treatment for people with 10-year fracture probabilities above or below these levels FOLLOW-UP: People with diagnosed cases of osteoporosis or at high risk for fracture should have regular bone mineral density tests. For patients eligible for Medicare, routine testing is allowed once every 2 years. The testing frequency can be increased to one year for patients who have rapidly progressing disease, those who are receiving or discontinuing medical therapy to restore bone mass, or have additional risk factors. I have reviewed this report, and agree with the above findings. [facility] Electronically Signed   By: Baird Lyons M.D.   On: 07/09/2023 14:07       Assessment & Plan:  Anemia, unspecified type Assessment & Plan: Hematology 04/2023 - retacrit.  F/u. Retacrit if her hemoglobin remains below 10.0.  will recheck cbc today.   Orders: -     CBC with Differential/Platelet -     IBC + Ferritin  Aortic atherosclerosis (HCC) Assessment & Plan: Continue crestor.    Hypercholesterolemia Assessment & Plan: On crestor.  Follow lipid panel and liver function tests.    Orders: -     Hepatic function panel -     Lipid panel -     TSH  Hypertension, unspecified type Assessment & Plan: Blood pressure as outlined. On  metoprolol, amlodipine and losartan. Follow pressures.  Follow metabolic panel.   Orders: -     Basic metabolic panel  Hyperglycemia Assessment & Plan: Low carb diet and exercise.  Follow met b and a1c.   Orders: -     Hemoglobin A1c  Cellulitis of buttock -     Ambulatory referral to General Surgery  Chronic low back pain (1ry area of Pain) (Bilateral) (R>L) w/o sciatica Assessment & Plan: Is followed by pain clinic.    Stage 3a chronic kidney disease (HCC) Assessment & Plan: Avoid antiinflammatories.  Stay hdrated. Recheck metabolic  panel today.    Depression, major, single episode, mild (HCC) Assessment & Plan: Continue citalopram.  Overall appears to be handling things well.  Follow.     Gastroesophageal reflux disease, unspecified whether esophagitis present Assessment & Plan: No upper symptoms reported. On protonix.    Osteoporosis without current pathological fracture, unspecified osteoporosis type Assessment & Plan: zometa - per Dr Orlie Dakin    SVT (supraventricular tachycardia) Assessment & Plan: Follow up - 06/2023 - History of paroxysmal SVT, echo 7/24 EF 55 to 60%.  Continue Toprol-XL 50 daily.   Abscess and cellulitis of gluteal region Assessment & Plan: Recently admitted and given IV abx.  Discharged on oral abx.  She has improved.  MRSA negative.  Will extend out oral abx.  Augmentin 875mg  bid as directed.  Will have surgery evaluate.  Follow closely.     Other orders -     Amoxicillin-Pot Clavulanate; Take 1 tablet by mouth 2 (two) times daily.  Dispense: 14 tablet; Refill: 0     [place], MD

## 2023-09-12 DIAGNOSIS — F419 Anxiety disorder, unspecified: Secondary | ICD-10-CM | POA: Diagnosis not present

## 2023-09-12 DIAGNOSIS — M810 Age-related osteoporosis without current pathological fracture: Secondary | ICD-10-CM | POA: Diagnosis not present

## 2023-09-12 DIAGNOSIS — I129 Hypertensive chronic kidney disease with stage 1 through stage 4 chronic kidney disease, or unspecified chronic kidney disease: Secondary | ICD-10-CM | POA: Diagnosis not present

## 2023-09-12 DIAGNOSIS — L0231 Cutaneous abscess of buttock: Secondary | ICD-10-CM | POA: Diagnosis not present

## 2023-09-12 DIAGNOSIS — F32A Depression, unspecified: Secondary | ICD-10-CM | POA: Diagnosis not present

## 2023-09-12 DIAGNOSIS — E7800 Pure hypercholesterolemia, unspecified: Secondary | ICD-10-CM | POA: Diagnosis not present

## 2023-09-12 DIAGNOSIS — D631 Anemia in chronic kidney disease: Secondary | ICD-10-CM | POA: Diagnosis not present

## 2023-09-12 DIAGNOSIS — N1831 Chronic kidney disease, stage 3a: Secondary | ICD-10-CM | POA: Diagnosis not present

## 2023-09-12 DIAGNOSIS — L03317 Cellulitis of buttock: Secondary | ICD-10-CM | POA: Diagnosis not present

## 2023-09-13 ENCOUNTER — Encounter: Payer: Self-pay | Admitting: Internal Medicine

## 2023-09-13 DIAGNOSIS — L0231 Cutaneous abscess of buttock: Secondary | ICD-10-CM | POA: Insufficient documentation

## 2023-09-13 NOTE — Assessment & Plan Note (Signed)
No upper symptoms reported.  On protonix.

## 2023-09-13 NOTE — Assessment & Plan Note (Signed)
Follow up - 06/2023 - History of paroxysmal SVT, echo 7/24 EF 55 to 60%.  Continue Toprol-XL 50 daily.

## 2023-09-13 NOTE — Assessment & Plan Note (Signed)
zometa - per Dr Orlie Dakin

## 2023-09-13 NOTE — Assessment & Plan Note (Signed)
Low carb diet and exercise.  Follow met b and a1c.

## 2023-09-13 NOTE — Assessment & Plan Note (Signed)
Continue crestor

## 2023-09-13 NOTE — Assessment & Plan Note (Signed)
Is followed by pain clinic.

## 2023-09-13 NOTE — Assessment & Plan Note (Signed)
Recently admitted and given IV abx.  Discharged on oral abx.  She has improved.  MRSA negative.  Will extend out oral abx.  Augmentin 875mg  bid as directed.  Will have surgery evaluate.  Follow closely.

## 2023-09-13 NOTE — Assessment & Plan Note (Signed)
Blood pressure as outlined. On metoprolol, amlodipine and losartan. Follow pressures.  Follow metabolic panel.

## 2023-09-13 NOTE — Assessment & Plan Note (Signed)
Avoid antiinflammatories.  Stay hdrated. Recheck metabolic panel today.

## 2023-09-13 NOTE — Assessment & Plan Note (Signed)
Hematology 04/2023 - retacrit.  F/u. Retacrit if her hemoglobin remains below 10.0.  will recheck cbc today.

## 2023-09-13 NOTE — Assessment & Plan Note (Signed)
On crestor.  Follow lipid panel and liver function tests.

## 2023-09-13 NOTE — Assessment & Plan Note (Signed)
Continue citalopram.  Overall appears to be handling things well.  Follow.

## 2023-09-14 DIAGNOSIS — D631 Anemia in chronic kidney disease: Secondary | ICD-10-CM | POA: Diagnosis not present

## 2023-09-14 DIAGNOSIS — I129 Hypertensive chronic kidney disease with stage 1 through stage 4 chronic kidney disease, or unspecified chronic kidney disease: Secondary | ICD-10-CM | POA: Diagnosis not present

## 2023-09-14 DIAGNOSIS — L03317 Cellulitis of buttock: Secondary | ICD-10-CM | POA: Diagnosis not present

## 2023-09-14 DIAGNOSIS — M810 Age-related osteoporosis without current pathological fracture: Secondary | ICD-10-CM | POA: Diagnosis not present

## 2023-09-14 DIAGNOSIS — E7800 Pure hypercholesterolemia, unspecified: Secondary | ICD-10-CM | POA: Diagnosis not present

## 2023-09-14 DIAGNOSIS — L0231 Cutaneous abscess of buttock: Secondary | ICD-10-CM | POA: Diagnosis not present

## 2023-09-14 DIAGNOSIS — F32A Depression, unspecified: Secondary | ICD-10-CM | POA: Diagnosis not present

## 2023-09-14 DIAGNOSIS — N1831 Chronic kidney disease, stage 3a: Secondary | ICD-10-CM | POA: Diagnosis not present

## 2023-09-14 DIAGNOSIS — F419 Anxiety disorder, unspecified: Secondary | ICD-10-CM | POA: Diagnosis not present

## 2023-09-14 NOTE — Progress Notes (Unsigned)
PROVIDER NOTE: Information contained herein reflects review and annotations entered in association with encounter. Interpretation of such information and data should be left to medically-trained personnel. Information provided to patient can be located elsewhere in the medical record under "Patient Instructions". Document created using STT-dictation technology, any transcriptional errors that may result from process are unintentional.    Patient: Wanda Martinez Number  Service Category: E/M  Provider: Oswaldo Done, MD  DOB: 1935/10/07  DOS: 09/19/2023  Referring Provider: Dale San Luis, MD  MRN: [id number]  Specialty: Interventional Pain Management  PCP: Dale Trafalgar, MD  Type: Established Patient  Setting: Ambulatory outpatient    Location: Office  Delivery: Face-to-face     HPI  Wanda Martinez, a 88 y.o. year old female, is here today because of her No primary diagnosis found.. Wanda Martinez primary complain today is No chief complaint on file.  Pertinent problems: Ms. Zemaitis has Headache; Parkinson's disease; Chronic low back pain (1ry area of Pain) (Bilateral) (R>L) w/o sciatica; Lumbar spondylosis; Chronic hip pain (Bilateral); Chronic neck pain; Cervical spondylosis; Chronic cervical radicular pain (Right); Diffuse myofascial pain syndrome; Neurogenic pain; Chronic upper back pain (Right); Myofascial pain syndrome (Right) (cervicothoracic); Lumbar facet syndrome (Bilateral) (R>L); History of breast cancer; Cervical facet hypertrophy; Cervical facet syndrome (HCC); Chronic shoulder pain (Right); Chronic pain syndrome; Chronic sacroiliac joint pain (Left); Chronic sacroiliac joint pain (Right); 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, L4-5, L5-S1) (Right); Lumbar spinal stenosis (with neurogenic claudication) (L3-4); Chronic lower extremity pain (Right); Chronic lumbar radicular pain (Right); Trochanteric bursitis of hip (Bilateral); Spondylosis without myelopathy or radiculopathy,  lumbosacral region; Trigger point with back pain (Right); DDD (degenerative disc disease), lumbosacral; Chronic upper extremity pain (Right); Chronic thoracic back pain (Bilateral) (L>R); Trigger point of thoracic region (Bilateral) (L>R); Other specified dorsopathies, sacral and sacrococcygeal region; Sacroiliac joint dysfunction (Right); Osteoarthritis of sacroiliac joint (Right); Somatic dysfunction of sacroiliac joint (Right); Chronic musculoskeletal pain; Facial pain; Chronic hip pain (Right); Osteoarthritis of hip (Right); Left ear pain; 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HCC); Abnormal MRI, lumbar spine (08/23/2022); Osteoarthritis involving multiple joints; Other spondylosis, sacral and sacrococcygeal region; Lumbar facet hypertrophy (Multilevel) (Bilateral); Chronic knee pain (Right); Osteoarthritis of knee (Right); Trigger point of shoulder region (Right); Osteoarthritis of AC (acromioclavicular) joint (Right); Osteoarthritis of shoulder (Right); Unspecified injury of muscle(s) and tendon(s) of the rotator cuff of shoulder, sequela (Right); Cervicalgia; DDD (degenerative disc disease), lumbar; Lumbar radicular pain (Bilateral); Lumbar foraminal stenosis (Right: L3-4, L4-5, L5-S1) (Left: L2-3); Abnormal MRI, hip (Right) (05/04/2022); Abnormal MRI, knee (01/04/2022); Tricompartment osteoarthritis of knee (Right); Chronic low back pain (Right) w/o sciatica; Hoffa's fat pad disease (HCC) (Right); Hoffa's pad syndrome (HCC) (Right); Chronic low back pain (Right) w/ sciatica (Right); Lower extremity weakness (Right); Chronic radicular pain of lower back; Chronic sciatica (Right); Lumbosacral radiculopathy at L5 (Right); and Low back pain of over 3 months duration on their pertinent problem list. Pain Assessment: Severity of   is reported as a  /10. Location:    / . Onset:  . Quality:  . Timing:  . Modifying factor(s):  Marland Kitchen Vitals:  vitals were not taken for this visit.  BMI: Estimated body mass index is 29.35 kg/m  as calculated from the following:   Height as of 09/11/23: 5\' 4"  (1.626 m).   Weight as of 09/11/23: 171 lb (77.6 kg). Last encounter: 07/31/2023. Last procedure: 07/03/2023.  Reason for encounter: medication management. ***  Pharmacotherapy Assessment  Analgesic: Oxycodone IR 5 mg, 1 tab PO q 8 hrs (15 mg/day  PROVIDER NOTE: Information contained herein reflects review and annotations entered in association with encounter. Interpretation of such information and data should be left to medically-trained personnel. Information provided to patient can be located elsewhere in the medical record under "Patient Instructions". Document created using STT-dictation technology, any transcriptional errors that may result from process are unintentional.    Patient: Wanda Martinez Number  Service Category: E/M  Provider: Oswaldo Done, MD  DOB: 1935/10/07  DOS: 09/19/2023  Referring Provider: Dale San Luis, MD  MRN: [id number]  Specialty: Interventional Pain Management  PCP: Dale Trafalgar, MD  Type: Established Patient  Setting: Ambulatory outpatient    Location: Office  Delivery: Face-to-face     HPI  Wanda Martinez, a 88 y.o. year old female, is here today because of her No primary diagnosis found.. Wanda Martinez primary complain today is No chief complaint on file.  Pertinent problems: Ms. Zemaitis has Headache; Parkinson's disease; Chronic low back pain (1ry area of Pain) (Bilateral) (R>L) w/o sciatica; Lumbar spondylosis; Chronic hip pain (Bilateral); Chronic neck pain; Cervical spondylosis; Chronic cervical radicular pain (Right); Diffuse myofascial pain syndrome; Neurogenic pain; Chronic upper back pain (Right); Myofascial pain syndrome (Right) (cervicothoracic); Lumbar facet syndrome (Bilateral) (R>L); History of breast cancer; Cervical facet hypertrophy; Cervical facet syndrome (HCC); Chronic shoulder pain (Right); Chronic pain syndrome; Chronic sacroiliac joint pain (Left); Chronic sacroiliac joint pain (Right); 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, L4-5, L5-S1) (Right); Lumbar spinal stenosis (with neurogenic claudication) (L3-4); Chronic lower extremity pain (Right); Chronic lumbar radicular pain (Right); Trochanteric bursitis of hip (Bilateral); Spondylosis without myelopathy or radiculopathy,  lumbosacral region; Trigger point with back pain (Right); DDD (degenerative disc disease), lumbosacral; Chronic upper extremity pain (Right); Chronic thoracic back pain (Bilateral) (L>R); Trigger point of thoracic region (Bilateral) (L>R); Other specified dorsopathies, sacral and sacrococcygeal region; Sacroiliac joint dysfunction (Right); Osteoarthritis of sacroiliac joint (Right); Somatic dysfunction of sacroiliac joint (Right); Chronic musculoskeletal pain; Facial pain; Chronic hip pain (Right); Osteoarthritis of hip (Right); Left ear pain; 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HCC); Abnormal MRI, lumbar spine (08/23/2022); Osteoarthritis involving multiple joints; Other spondylosis, sacral and sacrococcygeal region; Lumbar facet hypertrophy (Multilevel) (Bilateral); Chronic knee pain (Right); Osteoarthritis of knee (Right); Trigger point of shoulder region (Right); Osteoarthritis of AC (acromioclavicular) joint (Right); Osteoarthritis of shoulder (Right); Unspecified injury of muscle(s) and tendon(s) of the rotator cuff of shoulder, sequela (Right); Cervicalgia; DDD (degenerative disc disease), lumbar; Lumbar radicular pain (Bilateral); Lumbar foraminal stenosis (Right: L3-4, L4-5, L5-S1) (Left: L2-3); Abnormal MRI, hip (Right) (05/04/2022); Abnormal MRI, knee (01/04/2022); Tricompartment osteoarthritis of knee (Right); Chronic low back pain (Right) w/o sciatica; Hoffa's fat pad disease (HCC) (Right); Hoffa's pad syndrome (HCC) (Right); Chronic low back pain (Right) w/ sciatica (Right); Lower extremity weakness (Right); Chronic radicular pain of lower back; Chronic sciatica (Right); Lumbosacral radiculopathy at L5 (Right); and Low back pain of over 3 months duration on their pertinent problem list. Pain Assessment: Severity of   is reported as a  /10. Location:    / . Onset:  . Quality:  . Timing:  . Modifying factor(s):  Marland Kitchen Vitals:  vitals were not taken for this visit.  BMI: Estimated body mass index is 29.35 kg/m  as calculated from the following:   Height as of 09/11/23: 5\' 4"  (1.626 m).   Weight as of 09/11/23: 171 lb (77.6 kg). Last encounter: 07/31/2023. Last procedure: 07/03/2023.  Reason for encounter: medication management. ***  Pharmacotherapy Assessment  Analgesic: Oxycodone IR 5 mg, 1 tab PO q 8 hrs (15 mg/day  Date of Birth:  Apr 17, 1935          BSA:          1.839 m Patient Age:    87 years            BP:           112/62 mmHg Patient Gender: F                   HR:           50 bpm. Exam Location:  [facility]  Procedure: 2D Echo, Cardiac Doppler, Color Doppler and Intracardiac            Opacification Agent  Indications:    I47.1 SVT; R53.83 Fatigue; R60.0 Lower extremity edema; R06.02                 SOB   History:        Patient has no prior history of Echocardiogram examinations.                 Arrythmias:SVT, Signs/Symptoms:Fatigue, Edema and Shortness of                 Breath; Risk Factors:Non-Smoker.   Sonographer:    Quentin Ore RDMS, RVT, RDCS Referring Phys: [id number] BRIAN AGBOR-ETANG  IMPRESSIONS   1. Left ventricular ejection  fraction, by estimation, is 55 to 60%. The left ventricle has normal function. The left ventricle has no regional wall motion abnormalities. There is mild left ventricular hypertrophy. Left ventricular diastolic parameters  are indeterminate.  2. Right ventricular systolic function is normal. The right ventricular size is normal.  3. There is no evidence of cardiac tamponade.  4. The mitral valve is normal in structure. No evidence of mitral valve regurgitation. No evidence of mitral stenosis.  5. The aortic valve has an indeterminant number of cusps. Aortic valve regurgitation is not visualized. No aortic stenosis is present.  6. The inferior vena cava is normal in size with greater than 50% respiratory variability, suggesting right atrial pressure of 3 mmHg.  FINDINGS  Left Ventricle: Left ventricular ejection fraction, by estimation, is 55 to 60%. The left ventricle has normal function. The left ventricle has no regional wall motion abnormalities. Definity contrast agent was given IV to delineate the left ventricular  endocardial borders. The left ventricular internal cavity size was normal in size. There is mild left ventricular hypertrophy. Left ventricular diastolic parameters are indeterminate.  Right Ventricle: The right ventricular size is normal. No increase in right ventricular wall thickness. Right ventricular systolic function is normal.  Left Atrium: Left atrial size was normal in size.  Right Atrium: Right atrial size was normal in size.  Pericardium: Trivial pericardial effusion is present. The pericardial effusion is circumferential. There is no evidence of cardiac tamponade.  Mitral Valve: The mitral valve is normal in structure. No evidence of mitral valve regurgitation. No evidence of mitral valve stenosis.  Tricuspid Valve: The tricuspid valve is normal in structure. Tricuspid valve regurgitation is not demonstrated. No evidence of tricuspid stenosis.  Aortic Valve: The  aortic valve has an indeterminant number of cusps. Aortic valve regurgitation is not visualized. No aortic stenosis is present. Aortic valve mean gradient measures 4.5 mmHg. Aortic valve peak gradient measures 12.0 mmHg. Aortic valve  area, by VTI measures 1.71 cm.  Pulmonic Valve: The pulmonic valve was normal in structure. Pulmonic valve regurgitation is not visualized. No evidence of pulmonic stenosis.  Aorta: The aortic root is  PROVIDER NOTE: Information contained herein reflects review and annotations entered in association with encounter. Interpretation of such information and data should be left to medically-trained personnel. Information provided to patient can be located elsewhere in the medical record under "Patient Instructions". Document created using STT-dictation technology, any transcriptional errors that may result from process are unintentional.    Patient: Wanda Martinez Number  Service Category: E/M  Provider: Oswaldo Done, MD  DOB: 1935/10/07  DOS: 09/19/2023  Referring Provider: Dale San Luis, MD  MRN: [id number]  Specialty: Interventional Pain Management  PCP: Dale Trafalgar, MD  Type: Established Patient  Setting: Ambulatory outpatient    Location: Office  Delivery: Face-to-face     HPI  Wanda Martinez, a 88 y.o. year old female, is here today because of her No primary diagnosis found.. Wanda Martinez primary complain today is No chief complaint on file.  Pertinent problems: Ms. Zemaitis has Headache; Parkinson's disease; Chronic low back pain (1ry area of Pain) (Bilateral) (R>L) w/o sciatica; Lumbar spondylosis; Chronic hip pain (Bilateral); Chronic neck pain; Cervical spondylosis; Chronic cervical radicular pain (Right); Diffuse myofascial pain syndrome; Neurogenic pain; Chronic upper back pain (Right); Myofascial pain syndrome (Right) (cervicothoracic); Lumbar facet syndrome (Bilateral) (R>L); History of breast cancer; Cervical facet hypertrophy; Cervical facet syndrome (HCC); Chronic shoulder pain (Right); Chronic pain syndrome; Chronic sacroiliac joint pain (Left); Chronic sacroiliac joint pain (Right); 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, L4-5, L5-S1) (Right); Lumbar spinal stenosis (with neurogenic claudication) (L3-4); Chronic lower extremity pain (Right); Chronic lumbar radicular pain (Right); Trochanteric bursitis of hip (Bilateral); Spondylosis without myelopathy or radiculopathy,  lumbosacral region; Trigger point with back pain (Right); DDD (degenerative disc disease), lumbosacral; Chronic upper extremity pain (Right); Chronic thoracic back pain (Bilateral) (L>R); Trigger point of thoracic region (Bilateral) (L>R); Other specified dorsopathies, sacral and sacrococcygeal region; Sacroiliac joint dysfunction (Right); Osteoarthritis of sacroiliac joint (Right); Somatic dysfunction of sacroiliac joint (Right); Chronic musculoskeletal pain; Facial pain; Chronic hip pain (Right); Osteoarthritis of hip (Right); Left ear pain; 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HCC); Abnormal MRI, lumbar spine (08/23/2022); Osteoarthritis involving multiple joints; Other spondylosis, sacral and sacrococcygeal region; Lumbar facet hypertrophy (Multilevel) (Bilateral); Chronic knee pain (Right); Osteoarthritis of knee (Right); Trigger point of shoulder region (Right); Osteoarthritis of AC (acromioclavicular) joint (Right); Osteoarthritis of shoulder (Right); Unspecified injury of muscle(s) and tendon(s) of the rotator cuff of shoulder, sequela (Right); Cervicalgia; DDD (degenerative disc disease), lumbar; Lumbar radicular pain (Bilateral); Lumbar foraminal stenosis (Right: L3-4, L4-5, L5-S1) (Left: L2-3); Abnormal MRI, hip (Right) (05/04/2022); Abnormal MRI, knee (01/04/2022); Tricompartment osteoarthritis of knee (Right); Chronic low back pain (Right) w/o sciatica; Hoffa's fat pad disease (HCC) (Right); Hoffa's pad syndrome (HCC) (Right); Chronic low back pain (Right) w/ sciatica (Right); Lower extremity weakness (Right); Chronic radicular pain of lower back; Chronic sciatica (Right); Lumbosacral radiculopathy at L5 (Right); and Low back pain of over 3 months duration on their pertinent problem list. Pain Assessment: Severity of   is reported as a  /10. Location:    / . Onset:  . Quality:  . Timing:  . Modifying factor(s):  Marland Kitchen Vitals:  vitals were not taken for this visit.  BMI: Estimated body mass index is 29.35 kg/m  as calculated from the following:   Height as of 09/11/23: 5\' 4"  (1.626 m).   Weight as of 09/11/23: 171 lb (77.6 kg). Last encounter: 07/31/2023. Last procedure: 07/03/2023.  Reason for encounter: medication management. ***  Pharmacotherapy Assessment  Analgesic: Oxycodone IR 5 mg, 1 tab PO q 8 hrs (15 mg/day  Date of Birth:  Apr 17, 1935          BSA:          1.839 m Patient Age:    87 years            BP:           112/62 mmHg Patient Gender: F                   HR:           50 bpm. Exam Location:  [facility]  Procedure: 2D Echo, Cardiac Doppler, Color Doppler and Intracardiac            Opacification Agent  Indications:    I47.1 SVT; R53.83 Fatigue; R60.0 Lower extremity edema; R06.02                 SOB   History:        Patient has no prior history of Echocardiogram examinations.                 Arrythmias:SVT, Signs/Symptoms:Fatigue, Edema and Shortness of                 Breath; Risk Factors:Non-Smoker.   Sonographer:    Quentin Ore RDMS, RVT, RDCS Referring Phys: [id number] BRIAN AGBOR-ETANG  IMPRESSIONS   1. Left ventricular ejection  fraction, by estimation, is 55 to 60%. The left ventricle has normal function. The left ventricle has no regional wall motion abnormalities. There is mild left ventricular hypertrophy. Left ventricular diastolic parameters  are indeterminate.  2. Right ventricular systolic function is normal. The right ventricular size is normal.  3. There is no evidence of cardiac tamponade.  4. The mitral valve is normal in structure. No evidence of mitral valve regurgitation. No evidence of mitral stenosis.  5. The aortic valve has an indeterminant number of cusps. Aortic valve regurgitation is not visualized. No aortic stenosis is present.  6. The inferior vena cava is normal in size with greater than 50% respiratory variability, suggesting right atrial pressure of 3 mmHg.  FINDINGS  Left Ventricle: Left ventricular ejection fraction, by estimation, is 55 to 60%. The left ventricle has normal function. The left ventricle has no regional wall motion abnormalities. Definity contrast agent was given IV to delineate the left ventricular  endocardial borders. The left ventricular internal cavity size was normal in size. There is mild left ventricular hypertrophy. Left ventricular diastolic parameters are indeterminate.  Right Ventricle: The right ventricular size is normal. No increase in right ventricular wall thickness. Right ventricular systolic function is normal.  Left Atrium: Left atrial size was normal in size.  Right Atrium: Right atrial size was normal in size.  Pericardium: Trivial pericardial effusion is present. The pericardial effusion is circumferential. There is no evidence of cardiac tamponade.  Mitral Valve: The mitral valve is normal in structure. No evidence of mitral valve regurgitation. No evidence of mitral valve stenosis.  Tricuspid Valve: The tricuspid valve is normal in structure. Tricuspid valve regurgitation is not demonstrated. No evidence of tricuspid stenosis.  Aortic Valve: The  aortic valve has an indeterminant number of cusps. Aortic valve regurgitation is not visualized. No aortic stenosis is present. Aortic valve mean gradient measures 4.5 mmHg. Aortic valve peak gradient measures 12.0 mmHg. Aortic valve  area, by VTI measures 1.71 cm.  Pulmonic Valve: The pulmonic valve was normal in structure. Pulmonic valve regurgitation is not visualized. No evidence of pulmonic stenosis.  Aorta: The aortic root is  PROVIDER NOTE: Information contained herein reflects review and annotations entered in association with encounter. Interpretation of such information and data should be left to medically-trained personnel. Information provided to patient can be located elsewhere in the medical record under "Patient Instructions". Document created using STT-dictation technology, any transcriptional errors that may result from process are unintentional.    Patient: Wanda Martinez Number  Service Category: E/M  Provider: Oswaldo Done, MD  DOB: 1935/10/07  DOS: 09/19/2023  Referring Provider: Dale San Luis, MD  MRN: [id number]  Specialty: Interventional Pain Management  PCP: Dale Trafalgar, MD  Type: Established Patient  Setting: Ambulatory outpatient    Location: Office  Delivery: Face-to-face     HPI  Wanda Martinez, a 88 y.o. year old female, is here today because of her No primary diagnosis found.. Wanda Martinez primary complain today is No chief complaint on file.  Pertinent problems: Ms. Zemaitis has Headache; Parkinson's disease; Chronic low back pain (1ry area of Pain) (Bilateral) (R>L) w/o sciatica; Lumbar spondylosis; Chronic hip pain (Bilateral); Chronic neck pain; Cervical spondylosis; Chronic cervical radicular pain (Right); Diffuse myofascial pain syndrome; Neurogenic pain; Chronic upper back pain (Right); Myofascial pain syndrome (Right) (cervicothoracic); Lumbar facet syndrome (Bilateral) (R>L); History of breast cancer; Cervical facet hypertrophy; Cervical facet syndrome (HCC); Chronic shoulder pain (Right); Chronic pain syndrome; Chronic sacroiliac joint pain (Left); Chronic sacroiliac joint pain (Right); 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, L4-5, L5-S1) (Right); Lumbar spinal stenosis (with neurogenic claudication) (L3-4); Chronic lower extremity pain (Right); Chronic lumbar radicular pain (Right); Trochanteric bursitis of hip (Bilateral); Spondylosis without myelopathy or radiculopathy,  lumbosacral region; Trigger point with back pain (Right); DDD (degenerative disc disease), lumbosacral; Chronic upper extremity pain (Right); Chronic thoracic back pain (Bilateral) (L>R); Trigger point of thoracic region (Bilateral) (L>R); Other specified dorsopathies, sacral and sacrococcygeal region; Sacroiliac joint dysfunction (Right); Osteoarthritis of sacroiliac joint (Right); Somatic dysfunction of sacroiliac joint (Right); Chronic musculoskeletal pain; Facial pain; Chronic hip pain (Right); Osteoarthritis of hip (Right); Left ear pain; 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HCC); Abnormal MRI, lumbar spine (08/23/2022); Osteoarthritis involving multiple joints; Other spondylosis, sacral and sacrococcygeal region; Lumbar facet hypertrophy (Multilevel) (Bilateral); Chronic knee pain (Right); Osteoarthritis of knee (Right); Trigger point of shoulder region (Right); Osteoarthritis of AC (acromioclavicular) joint (Right); Osteoarthritis of shoulder (Right); Unspecified injury of muscle(s) and tendon(s) of the rotator cuff of shoulder, sequela (Right); Cervicalgia; DDD (degenerative disc disease), lumbar; Lumbar radicular pain (Bilateral); Lumbar foraminal stenosis (Right: L3-4, L4-5, L5-S1) (Left: L2-3); Abnormal MRI, hip (Right) (05/04/2022); Abnormal MRI, knee (01/04/2022); Tricompartment osteoarthritis of knee (Right); Chronic low back pain (Right) w/o sciatica; Hoffa's fat pad disease (HCC) (Right); Hoffa's pad syndrome (HCC) (Right); Chronic low back pain (Right) w/ sciatica (Right); Lower extremity weakness (Right); Chronic radicular pain of lower back; Chronic sciatica (Right); Lumbosacral radiculopathy at L5 (Right); and Low back pain of over 3 months duration on their pertinent problem list. Pain Assessment: Severity of   is reported as a  /10. Location:    / . Onset:  . Quality:  . Timing:  . Modifying factor(s):  Marland Kitchen Vitals:  vitals were not taken for this visit.  BMI: Estimated body mass index is 29.35 kg/m  as calculated from the following:   Height as of 09/11/23: 5\' 4"  (1.626 m).   Weight as of 09/11/23: 171 lb (77.6 kg). Last encounter: 07/31/2023. Last procedure: 07/03/2023.  Reason for encounter: medication management. ***  Pharmacotherapy Assessment  Analgesic: Oxycodone IR 5 mg, 1 tab PO q 8 hrs (15 mg/day  Date of Birth:  Apr 17, 1935          BSA:          1.839 m Patient Age:    87 years            BP:           112/62 mmHg Patient Gender: F                   HR:           50 bpm. Exam Location:  [facility]  Procedure: 2D Echo, Cardiac Doppler, Color Doppler and Intracardiac            Opacification Agent  Indications:    I47.1 SVT; R53.83 Fatigue; R60.0 Lower extremity edema; R06.02                 SOB   History:        Patient has no prior history of Echocardiogram examinations.                 Arrythmias:SVT, Signs/Symptoms:Fatigue, Edema and Shortness of                 Breath; Risk Factors:Non-Smoker.   Sonographer:    Quentin Ore RDMS, RVT, RDCS Referring Phys: [id number] BRIAN AGBOR-ETANG  IMPRESSIONS   1. Left ventricular ejection  fraction, by estimation, is 55 to 60%. The left ventricle has normal function. The left ventricle has no regional wall motion abnormalities. There is mild left ventricular hypertrophy. Left ventricular diastolic parameters  are indeterminate.  2. Right ventricular systolic function is normal. The right ventricular size is normal.  3. There is no evidence of cardiac tamponade.  4. The mitral valve is normal in structure. No evidence of mitral valve regurgitation. No evidence of mitral stenosis.  5. The aortic valve has an indeterminant number of cusps. Aortic valve regurgitation is not visualized. No aortic stenosis is present.  6. The inferior vena cava is normal in size with greater than 50% respiratory variability, suggesting right atrial pressure of 3 mmHg.  FINDINGS  Left Ventricle: Left ventricular ejection fraction, by estimation, is 55 to 60%. The left ventricle has normal function. The left ventricle has no regional wall motion abnormalities. Definity contrast agent was given IV to delineate the left ventricular  endocardial borders. The left ventricular internal cavity size was normal in size. There is mild left ventricular hypertrophy. Left ventricular diastolic parameters are indeterminate.  Right Ventricle: The right ventricular size is normal. No increase in right ventricular wall thickness. Right ventricular systolic function is normal.  Left Atrium: Left atrial size was normal in size.  Right Atrium: Right atrial size was normal in size.  Pericardium: Trivial pericardial effusion is present. The pericardial effusion is circumferential. There is no evidence of cardiac tamponade.  Mitral Valve: The mitral valve is normal in structure. No evidence of mitral valve regurgitation. No evidence of mitral valve stenosis.  Tricuspid Valve: The tricuspid valve is normal in structure. Tricuspid valve regurgitation is not demonstrated. No evidence of tricuspid stenosis.  Aortic Valve: The  aortic valve has an indeterminant number of cusps. Aortic valve regurgitation is not visualized. No aortic stenosis is present. Aortic valve mean gradient measures 4.5 mmHg. Aortic valve peak gradient measures 12.0 mmHg. Aortic valve  area, by VTI measures 1.71 cm.  Pulmonic Valve: The pulmonic valve was normal in structure. Pulmonic valve regurgitation is not visualized. No evidence of pulmonic stenosis.  Aorta: The aortic root is

## 2023-09-16 NOTE — Patient Instructions (Incomplete)
______________________________________________________________________    Drug Holidays  What is a "Drug Holiday"? Drug Holiday: is the name given to the process of slowly tapering down and temporarily stopping the pain medication for the purpose of decreasing or eliminating tolerance to the drug.  Benefits Improved effectiveness Decreased required effective dose Improved pain control End dependence on high dose therapy Decrease cost of therapy Uncovering "opioid-induced hyperalgesia". (OIH)  What is "opioid hyperalgesia"? It is a paradoxical increase in pain caused by exposure to opioids. Stopping the opioid pain medication, contrary to the expected, it actually decreases or completely eliminates the pain. Ref.: "A comprehensive review of opioid-induced hyperalgesia". Donney Rankins, et.al. Pain Physician. 2011 Mar-Apr;14(2):145-61.  What is tolerance? Tolerance: the progressive loss of effectiveness of a pain medicine due to repetitive use. A common problem of opioid pain medications.  How long should a "Drug Holiday" last? Effectiveness depends on the patient staying off all opioid pain medicines for a minimum of 14 consecutive days. (2 weeks)  How about just taking less of the medicine? Does not work. Will not accomplish goal of eliminating the excess receptors.  How about switching to a different pain medicine? (AKA. "Opioid rotation") Does not work. Creates the illusion of effectiveness by taking advantage of inaccurate equivalent dose calculations between different opioids. -This "technique" was promoted by studies funded by [employer], such as [employer], creators of "OxyContin".  Can I stop the medicine "cold [place]"? We do not recommend it. You should always coordinate with your prescribing physician to make the transition as smoothly as possible. Avoid stopping the medicine abruptly without consulting. We recommend a "slow taper".  What is a slow  taper? Taper: refers to the gradual decrease in dose.   How do I stop/taper the dose? Slowly. Decrease the daily amount of pills that you take by one (1) pill every seven (7) days. This is called a "slow downward taper". Example: if you normally take four (4) pills per day, drop it to three (3) pills per day for seven (7) days, then to two (2) pills per day for seven (7) days, then to one (1) per day for seven (7) days, and then stop the medicine. The 14 day "Drug Holiday" starts on the first day without medicine.   Will I experience withdrawals? Unlikely with a slow taper.  What triggers withdrawals? Withdrawals are triggered by the sudden/abrupt stop of high dose opioids. Withdrawals can be avoided by slowly decreasing the dose over a prolonged period of time.  What are withdrawals? Symptoms associated with sudden/abrupt reduction/stopping of high-dose, long-term use of pain medication. Withdrawal are seldom seen on low dose therapy, or patients rarely taking opioid medication.  Early Withdrawal Symptoms may include: Agitation Anxiety Muscle aches Increased tearing Insomnia Runny nose Sweating Yawning  Late symptoms may include: Abdominal cramping Diarrhea Dilated pupils Goose bumps Nausea Vomiting  When could I see withdrawals? Onset: 8-24 hours after last use for most opioids. 12-48 hours for long-acting opioids (i.e.: methadone)  How long could they last? Duration: 4-10 days for most opioids. 14-21 days for long-acting opioids (i.e.: methadone)  What will happen after I complete my "Drug Holiday"? The need and indications for the opioid analgesic will be reviewed before restarting the medication. Dose requirements will likely decrease and the dose will need to be adjusted accordingly.   (Last update: 03/07/2023) ______________________________________________________________________    ______________________________________________________________________    OTC  Supplements: The following over-the-counter (OTC) supplements may be of some benefits when used in moderation in some  chronic pain conditions. Note: Always consult with your primary care provider and/or pharmacist before taking any OTC medications to make sure there are no "drug-to-drug" interactions with the medications you currently take. Ask your physician which may be beneficial for your particular condition.  Supplement Possible benefit May be of benefit in treatment of   Turmeric/curcumin* anti-inflammatory Joint and muscle aches and pain associated with arthritis and inflammation  Glucosamine/chondroitin (triple strength)* may slow loss of articular cartilage Osteoarthritis  Vitamin D-3* may suppress release of chemicals associated with inflammation Joint and muscle aches and pain associated with arthritis and inflammation  Moringa* anti-inflammatory with mild analgesic effects Joint and muscle aches and pain associated with arthritis and inflammation  Melatonin* Helps reset sleep cycle. Insomnia. May also be helpful in neurodegenerative disorders  Vitamin B-12* may help keep nerves and blood cells healthy as well as maintaining function of nervous system Neuropathies. Nerve pain (Burning pain)  Alpha-Lipoic-Acid (ALA)* antioxidant that may help with nerve health, pain, and blocking the activation of some inflammatory chemicals Diabetic neuropathy and metabolic syndrome       (*Always use manufacturer's recommended dosage.)  ______________________________________________________________________     ______________________________________________________________________    TENS (Device can be purchased "online", without prescription. Search: "TENS 7000".) Transcutaneous electrical nerve stimulation (TENS) is a method of pain relief involving the use of a mild electrical current. A TENS machine is a small, battery-operated device that has leads connected to sticky pads called  electrodes.   Rechargeable 9V batteries:     Electrode placement:   TENS UNIT SAFETY WARNING SHEET and INFORMATION INDICATIONS AND CONTRAINDICTIONS Read the operation manual before using the device. Freight forwarder ([place]) restricts this device to sale by or on the order of a physician. Observe your physician's precise instructions and let him show you where to apply the electrodes. For a successful therapy, the correct application of the electrodes is an important factor. Carefully write down the settings your physician recommended. Indications for use This device is a prescription device and only for symptomatic relief of chronic intractable pain. Contraindications:   Any electrode placement that applies current to the carotid sinus (neck) region.   Patients with implanted electronic devices (for example, a pacemaker) or metallic implants should not undertake.   Any electrode placement that causes current to flow transcerebrally (through the head). The use of unit whenever pain symptoms are undiagnosed, unit etiology is determined.   The use of TENS whenever pain syndromes are undiagnosed, until etiology is established.  WARNINGS AND PRECAUTIONS  Warnings:   The device must be kept out of reach of children.   The safety of device for use during pregnancy or delivery has not been established.   Do not place electrodes on front of the throat. This may result in spasms of the laryngeal and pharyngeal muscles.   Do not place the electrodes over the carotid nerve (side of neck below ear).   The device is not effective for pain of central origin (headaches).   The device may interfere with electronic monitoring equipment (such as ECG monitors and ECG alarms).   Electrodes should not be placed over the eyes, in the mouth, or internally.   These devices have no curative value.   TENS devices should be used only under the continued supervision of a physician.   TENS is a symptomatic treatment and as such suppresses  the sensation of pain which would otherwise serve as a protective mechanism. Precautions/Adverse Reactions   Isolated cases of skin irritation  may occur at the site of electrode placement following long-term application.   Stimulation should be stopped and electrodes removed until the cause of the irritation can be determined.   Effectiveness is highly dependent upon patient selection by a person qualified in the management of pain patients.   If the device treatment becomes ineffective or unpleasant, stimulation should be discontinued until reevaluation by a physician/clinician.   Always turn the device off before applying or removing electrodes.   Skin irritation and electrode burns are potential adverse reactions.  PURPOSE: A Transcutaneous Electrical Nerve Stimulator, or TENS, unit is designed to relieve post-operative, acute and chronic pain. It is used for pain caused by peripheral nerves and not central. TENS units are prescription-only devices.  OPERATION: TENS units work in a couple of ways. The first way they are thought to work is by a method called the [employer]. The [employer] states that our brains can only handle one stimulus at a time. When you have chronic pain, this pain signal is constantly being sent to your brain and recognized as pain. When an electrical stimulus is added to the area of pain the body feels this electrical stimulus, and since the brain can only handle one thing at a time, the pain is not transmitted to the brain. The second method thought to be part of TENS unit's success is by way of stimulating our own bodies to release their own natural painkillers. TENS units do not work for everyone and results may vary. Always follow the instructions and warnings in your user's manual.  USE: One of the most important tasks that must be performed is battery maintenance. If you are using a Engineering geologist, always fully charge it and fully deplete it before charging  it again. These batteries can develop memories and by not performing this charging task correctly, your battery's life can be greatly diminished. If your battery does develop a memory you can help expand the memory by charging for 12 - 13 hours and then completely depleting the battery. Always prepare the skin before applying electrodes. Your skin should be clean and free of any lotions or creams. If you are using electrodes that use conductive gel, apply a small, even layer over the electrode. For carbon, self-adhesive electrodes, apply a drop of water to the electrodes before applying to the skin. The electrodes attach to the lead wires and then the TENS unit. Always grasp the connector and not the cord when inserting or removing. When making adjustments, always make sure the unit's channels (1 and 2) are in the OFF position. The actual settings should be recommended and prescribed by your physician. Medical equipment suppliers don'tset or instruct users as to user settings. When you are using the BURST mode, the unit delivers a series of quick pulses followed by a rest. This cycle repeats itself frequently. Always have channels OFF before changing modes.  For MODULATION mode, the stimulation automatically varies the width of the pulse.  For CONVENTIONAL mode, the stimulation is constant. After the settings have been fine-tuned, set the timer to 30 or 60 minutes. Your physician should also prescribe the use time. When the lights become dim, it means your batteries should be replaced or recharged.  ACCESSORIES: The electrodes and lead wires can be obtained from your medical equipment supplier. Your medical equipment supplier can set up a recurring delivery to accommodate your needs. Electrodes should be replaced once a month and lead wires once every 6 months.  Video  Tutorial https://youtu.be/[email or web address]?[email or web address]  ______________________________________________________________________

## 2023-09-18 DIAGNOSIS — N1831 Chronic kidney disease, stage 3a: Secondary | ICD-10-CM | POA: Diagnosis not present

## 2023-09-18 DIAGNOSIS — D631 Anemia in chronic kidney disease: Secondary | ICD-10-CM | POA: Diagnosis not present

## 2023-09-18 DIAGNOSIS — L0231 Cutaneous abscess of buttock: Secondary | ICD-10-CM | POA: Diagnosis not present

## 2023-09-18 DIAGNOSIS — F32A Depression, unspecified: Secondary | ICD-10-CM | POA: Diagnosis not present

## 2023-09-18 DIAGNOSIS — F419 Anxiety disorder, unspecified: Secondary | ICD-10-CM | POA: Diagnosis not present

## 2023-09-18 DIAGNOSIS — M810 Age-related osteoporosis without current pathological fracture: Secondary | ICD-10-CM | POA: Diagnosis not present

## 2023-09-18 DIAGNOSIS — E7800 Pure hypercholesterolemia, unspecified: Secondary | ICD-10-CM | POA: Diagnosis not present

## 2023-09-18 DIAGNOSIS — I129 Hypertensive chronic kidney disease with stage 1 through stage 4 chronic kidney disease, or unspecified chronic kidney disease: Secondary | ICD-10-CM | POA: Diagnosis not present

## 2023-09-18 DIAGNOSIS — L03317 Cellulitis of buttock: Secondary | ICD-10-CM | POA: Diagnosis not present

## 2023-09-19 ENCOUNTER — Encounter: Payer: Self-pay | Admitting: Pain Medicine

## 2023-09-19 ENCOUNTER — Ambulatory Visit: Payer: Medicare HMO | Attending: Pain Medicine | Admitting: Pain Medicine

## 2023-09-19 VITALS — BP 125/64 | HR 58 | Temp 97.0°F | Resp 18 | Ht 64.0 in | Wt 171.0 lb

## 2023-09-19 DIAGNOSIS — G8929 Other chronic pain: Secondary | ICD-10-CM | POA: Diagnosis not present

## 2023-09-19 DIAGNOSIS — Z79891 Long term (current) use of opiate analgesic: Secondary | ICD-10-CM | POA: Insufficient documentation

## 2023-09-19 DIAGNOSIS — M7051 Other bursitis of knee, right knee: Secondary | ICD-10-CM | POA: Diagnosis not present

## 2023-09-19 DIAGNOSIS — S46001S Unspecified injury of muscle(s) and tendon(s) of the rotator cuff of right shoulder, sequela: Secondary | ICD-10-CM | POA: Insufficient documentation

## 2023-09-19 DIAGNOSIS — M47816 Spondylosis without myelopathy or radiculopathy, lumbar region: Secondary | ICD-10-CM | POA: Insufficient documentation

## 2023-09-19 DIAGNOSIS — G894 Chronic pain syndrome: Secondary | ICD-10-CM | POA: Insufficient documentation

## 2023-09-19 DIAGNOSIS — M25561 Pain in right knee: Secondary | ICD-10-CM | POA: Diagnosis not present

## 2023-09-19 DIAGNOSIS — M25511 Pain in right shoulder: Secondary | ICD-10-CM | POA: Insufficient documentation

## 2023-09-19 DIAGNOSIS — M1711 Unilateral primary osteoarthritis, right knee: Secondary | ICD-10-CM | POA: Insufficient documentation

## 2023-09-19 DIAGNOSIS — M549 Dorsalgia, unspecified: Secondary | ICD-10-CM | POA: Insufficient documentation

## 2023-09-19 DIAGNOSIS — M7050 Other bursitis of knee, unspecified knee: Secondary | ICD-10-CM | POA: Diagnosis present

## 2023-09-19 DIAGNOSIS — Z79899 Other long term (current) drug therapy: Secondary | ICD-10-CM | POA: Diagnosis not present

## 2023-09-19 MED ORDER — OXYCODONE HCL 5 MG PO TABS: 5.0000 mg | ORAL_TABLET | Freq: Four times a day (QID) | ORAL | 0 refills | Status: DC | PRN

## 2023-09-19 NOTE — Progress Notes (Signed)
Call to patient's pharmacy and cancelled her prescription she has there for Oxycodone. Spoke with pharmacist Candise Bowens at [facility] in [place].   Earlyne Iba, RN

## 2023-09-19 NOTE — Progress Notes (Signed)
Nursing Pain Medication Assessment:  Safety precautions to be maintained throughout the outpatient stay will include: orient to surroundings, keep bed in low position, maintain call bell within reach at all times, provide assistance with transfer out of bed and ambulation.  Medication Inspection Compliance: Pill count conducted under aseptic conditions, in front of the patient. Neither the pills nor the bottle was removed from the patient's sight at any time. Once count was completed pills were immediately returned to the patient in their original bottle.  Medication: Oxycodone IR Pill/Patch Count:  81 of 90 pills remain Pill/Patch Appearance: Markings consistent with prescribed medication Bottle Appearance: Standard pharmacy container. Clearly labeled. Filled Date: 09 / 28 / 2024 Last Medication intake:  Today

## 2023-09-20 ENCOUNTER — Telehealth: Payer: Self-pay

## 2023-09-20 DIAGNOSIS — R222 Localized swelling, mass and lump, trunk: Secondary | ICD-10-CM | POA: Diagnosis not present

## 2023-09-20 NOTE — Telephone Encounter (Signed)
-----   Message from [place] sent at 09/17/2023  9:11 PM EDT ----- Given that she is on gabapentin and citalopram, would hold on adding trazodone.  If sleep is a continued issue, I would like for her to see psychiatry to help with medication management for her sleep.

## 2023-09-21 DIAGNOSIS — L03317 Cellulitis of buttock: Secondary | ICD-10-CM | POA: Diagnosis not present

## 2023-09-21 DIAGNOSIS — E876 Hypokalemia: Secondary | ICD-10-CM

## 2023-09-21 DIAGNOSIS — D631 Anemia in chronic kidney disease: Secondary | ICD-10-CM | POA: Diagnosis not present

## 2023-09-21 DIAGNOSIS — F32A Depression, unspecified: Secondary | ICD-10-CM | POA: Diagnosis not present

## 2023-09-21 DIAGNOSIS — L0231 Cutaneous abscess of buttock: Secondary | ICD-10-CM | POA: Diagnosis not present

## 2023-09-21 DIAGNOSIS — I129 Hypertensive chronic kidney disease with stage 1 through stage 4 chronic kidney disease, or unspecified chronic kidney disease: Secondary | ICD-10-CM | POA: Diagnosis not present

## 2023-09-21 DIAGNOSIS — M810 Age-related osteoporosis without current pathological fracture: Secondary | ICD-10-CM | POA: Diagnosis not present

## 2023-09-21 DIAGNOSIS — R251 Tremor, unspecified: Secondary | ICD-10-CM

## 2023-09-21 DIAGNOSIS — N1831 Chronic kidney disease, stage 3a: Secondary | ICD-10-CM | POA: Diagnosis not present

## 2023-09-21 DIAGNOSIS — F419 Anxiety disorder, unspecified: Secondary | ICD-10-CM | POA: Diagnosis not present

## 2023-09-21 DIAGNOSIS — E7800 Pure hypercholesterolemia, unspecified: Secondary | ICD-10-CM | POA: Diagnosis not present

## 2023-09-21 DIAGNOSIS — Z853 Personal history of malignant neoplasm of breast: Secondary | ICD-10-CM

## 2023-09-21 NOTE — Telephone Encounter (Signed)
Patient would prefer to hold off on psychiatry referral.

## 2023-09-21 NOTE — Telephone Encounter (Signed)
Pt called back and I read the message to her and she stated Thank you

## 2023-09-24 ENCOUNTER — Telehealth: Payer: Self-pay | Admitting: Pain Medicine

## 2023-09-24 NOTE — Telephone Encounter (Signed)
Wanda Martinez called stating she saw her surgeon and they told her she could have steroid injections to help with her pain. She would like to come in asap for injections. Please ask Dr Laban Emperor what he wants scheduled

## 2023-09-24 NOTE — Telephone Encounter (Signed)
Spoke with Dr Laban Emperor.  He does not want to do any procedures on Wanda Martinez until at least 2 weeks after her surgery due to her infection.  Instructed patient to call 2 weeks after her surgery and we will reevaluate.

## 2023-09-25 ENCOUNTER — Other Ambulatory Visit: Payer: Self-pay | Admitting: *Deleted

## 2023-09-25 DIAGNOSIS — C50211 Malignant neoplasm of upper-inner quadrant of right female breast: Secondary | ICD-10-CM

## 2023-09-26 ENCOUNTER — Inpatient Hospital Stay: Payer: Medicare HMO | Attending: Oncology

## 2023-09-26 ENCOUNTER — Inpatient Hospital Stay: Payer: Medicare HMO

## 2023-09-26 DIAGNOSIS — C50211 Malignant neoplasm of upper-inner quadrant of right female breast: Secondary | ICD-10-CM

## 2023-09-26 DIAGNOSIS — D631 Anemia in chronic kidney disease: Secondary | ICD-10-CM | POA: Diagnosis not present

## 2023-09-26 DIAGNOSIS — N186 End stage renal disease: Secondary | ICD-10-CM | POA: Insufficient documentation

## 2023-09-26 DIAGNOSIS — M810 Age-related osteoporosis without current pathological fracture: Secondary | ICD-10-CM | POA: Insufficient documentation

## 2023-09-26 LAB — CBC WITH DIFFERENTIAL/PLATELET
Abs Immature Granulocytes: 0.03 10*3/uL (ref 0.00–0.07)
Basophils Absolute: 0.1 10*3/uL (ref 0.0–0.1)
Basophils Relative: 1 %
Eosinophils Absolute: 0.2 10*3/uL (ref 0.0–0.5)
Eosinophils Relative: 2 %
HCT: 34.3 % — ABNORMAL LOW (ref 36.0–46.0)
Hemoglobin: 10.8 g/dL — ABNORMAL LOW (ref 12.0–15.0)
Immature Granulocytes: 0 %
Lymphocytes Relative: 19 %
Lymphs Abs: 1.5 10*3/uL (ref 0.7–4.0)
MCH: 29.4 pg (ref 26.0–34.0)
MCHC: 31.5 g/dL (ref 30.0–36.0)
MCV: 93.5 fL (ref 80.0–100.0)
Monocytes Absolute: 0.5 10*3/uL (ref 0.1–1.0)
Monocytes Relative: 7 %
Neutro Abs: 5.4 10*3/uL (ref 1.7–7.7)
Neutrophils Relative %: 71 %
Platelets: 237 10*3/uL (ref 150–400)
RBC: 3.67 MIL/uL — ABNORMAL LOW (ref 3.87–5.11)
RDW: 14.4 % (ref 11.5–15.5)
WBC: 7.7 10*3/uL (ref 4.0–10.5)
nRBC: 0 % (ref 0.0–0.2)

## 2023-09-26 LAB — BASIC METABOLIC PANEL
Anion gap: 6 (ref 5–15)
BUN: 33 mg/dL — ABNORMAL HIGH (ref 8–23)
CO2: 25 mmol/L (ref 22–32)
Calcium: 9.4 mg/dL (ref 8.9–10.3)
Chloride: 101 mmol/L (ref 98–111)
Creatinine, Ser: 1.14 mg/dL — ABNORMAL HIGH (ref 0.44–1.00)
GFR, Estimated: 47 mL/min — ABNORMAL LOW (ref >60)
Glucose, Bld: 81 mg/dL (ref 70–99)
Potassium: 4.2 mmol/L (ref 3.5–5.1)
Sodium: 132 mmol/L — ABNORMAL LOW (ref 135–145)

## 2023-09-26 NOTE — Progress Notes (Signed)
Patient didn't need to have her Retacrit today her Hgb was 10.8.

## 2023-09-27 DIAGNOSIS — F419 Anxiety disorder, unspecified: Secondary | ICD-10-CM | POA: Diagnosis not present

## 2023-09-27 DIAGNOSIS — I129 Hypertensive chronic kidney disease with stage 1 through stage 4 chronic kidney disease, or unspecified chronic kidney disease: Secondary | ICD-10-CM | POA: Diagnosis not present

## 2023-09-27 DIAGNOSIS — N1831 Chronic kidney disease, stage 3a: Secondary | ICD-10-CM | POA: Diagnosis not present

## 2023-09-27 DIAGNOSIS — D631 Anemia in chronic kidney disease: Secondary | ICD-10-CM | POA: Diagnosis not present

## 2023-09-27 DIAGNOSIS — L0231 Cutaneous abscess of buttock: Secondary | ICD-10-CM | POA: Diagnosis not present

## 2023-09-27 DIAGNOSIS — L03317 Cellulitis of buttock: Secondary | ICD-10-CM | POA: Diagnosis not present

## 2023-09-27 DIAGNOSIS — F32A Depression, unspecified: Secondary | ICD-10-CM | POA: Diagnosis not present

## 2023-09-27 DIAGNOSIS — E7800 Pure hypercholesterolemia, unspecified: Secondary | ICD-10-CM | POA: Diagnosis not present

## 2023-09-27 DIAGNOSIS — M810 Age-related osteoporosis without current pathological fracture: Secondary | ICD-10-CM | POA: Diagnosis not present

## 2023-09-28 ENCOUNTER — Telehealth: Payer: Self-pay | Admitting: Internal Medicine

## 2023-09-28 NOTE — Telephone Encounter (Signed)
[facility] called had a visit scheduled with patient on 10/11. Patient cancelled visit for today states she believes is having a Uti. Told them she was leaving to give her provider a urine sample.

## 2023-10-02 NOTE — Telephone Encounter (Signed)
Called and spoke with patient. She was not evaluated and not having any urinary symptoms at this time. She says since she was having some burning on Friday she called her brother in law and was given 7 days or cipro BID. She has an appt with Dr Lorin Picket 10/16.

## 2023-10-03 ENCOUNTER — Encounter: Payer: Medicare HMO | Admitting: Internal Medicine

## 2023-10-03 ENCOUNTER — Ambulatory Visit: Payer: Medicare HMO | Admitting: Internal Medicine

## 2023-10-03 VITALS — BP 112/68 | HR 81 | Temp 98.2°F | Resp 16 | Ht 64.0 in | Wt 171.0 lb

## 2023-10-03 DIAGNOSIS — L0231 Cutaneous abscess of buttock: Secondary | ICD-10-CM

## 2023-10-03 DIAGNOSIS — I471 Supraventricular tachycardia, unspecified: Secondary | ICD-10-CM | POA: Diagnosis not present

## 2023-10-03 DIAGNOSIS — Z0000 Encounter for general adult medical examination without abnormal findings: Secondary | ICD-10-CM | POA: Diagnosis not present

## 2023-10-03 DIAGNOSIS — I10 Essential (primary) hypertension: Secondary | ICD-10-CM

## 2023-10-03 DIAGNOSIS — F320 Major depressive disorder, single episode, mild: Secondary | ICD-10-CM | POA: Diagnosis not present

## 2023-10-03 DIAGNOSIS — M810 Age-related osteoporosis without current pathological fracture: Secondary | ICD-10-CM

## 2023-10-03 DIAGNOSIS — Z853 Personal history of malignant neoplasm of breast: Secondary | ICD-10-CM

## 2023-10-03 DIAGNOSIS — E7800 Pure hypercholesterolemia, unspecified: Secondary | ICD-10-CM | POA: Diagnosis not present

## 2023-10-03 DIAGNOSIS — R739 Hyperglycemia, unspecified: Secondary | ICD-10-CM

## 2023-10-03 DIAGNOSIS — I700 Atherosclerosis of aorta: Secondary | ICD-10-CM

## 2023-10-03 DIAGNOSIS — K219 Gastro-esophageal reflux disease without esophagitis: Secondary | ICD-10-CM | POA: Diagnosis not present

## 2023-10-03 DIAGNOSIS — D649 Anemia, unspecified: Secondary | ICD-10-CM

## 2023-10-03 DIAGNOSIS — N1831 Chronic kidney disease, stage 3a: Secondary | ICD-10-CM

## 2023-10-03 NOTE — Assessment & Plan Note (Signed)
Physical today 10/03/23.  Bilateral mastectomy - no mammogram.  Followed by oncology.  On prolia.

## 2023-10-03 NOTE — Progress Notes (Signed)
Subjective:    Patient ID: Wanda Martinez, female    DOB: [birth year], 88 y.o.   MRN: [id number]  Patient here for her physical exam.   HPI Here for a physical exam. Admitted from 08/22/23 - 08/29/23 - abscess and cellulitis of gluteal region. Initially treated with cefepime-clinda in ED. Then transitioned to Vanc-Cefepime. Added metronidazole 9/8, stopped vancomycin w/ neg MRSA screen. Transitioned cefepime to keflex 08/28/23.  Metronidazole was continued. Discharged on this regimen to complete 10 days. Also had painful lesion - right labia major. The labial lesion has resolved. The abscess/cellulitis - gluteal region - last visit - improved, but still tender and some fullness/pressure sensation. Saw Dr Maia Plan 09/20/23 - mass of buttock - recommended excision.  Schedule for 10/11/23. She is accompanied by her husband.  Reports increased pain - back.  Is followed by pain clinic, but given the infection, etc - she has been unable to get any injections.  She is taking oxycodone.  Discussed tylenol in between doses.  No chest pain reported.  Breathing stable.  No increased cough or congestion.  Eating.  Discussed taking miralax daily to help keep bowels moving. On cipro now.  Taking for urinary symptoms.  Urinary symptoms have improved. Gluteal abscess - improved.     Past Medical History:  Diagnosis Date   Acute postoperative pain 10/25/2017   Anemia    Arm pain 07/26/2015   Arthritis    Arthritis, degenerative 03/26/2014   Back pain 11/01/2013   Bladder infection 06/2018   Breast cancer (HCC)    Masectomy - left - 1986    Breast cancer [facility])    Mastectomy-right -2014   CHEST PAIN 04/29/2010   Qualifier: Diagnosis of  By: Laural Benes RN, Erika     Chronic cystitis    Cystocele 02/22/2013   Cystocele, midline 08/19/2013   Degeneration of intervertebral disc of lumbosacral region 03/26/2014   DYSPNEA 04/29/2010   Qualifier: Diagnosis of  By: Laural Benes RN, Erika     Enthesopathy of hip 03/26/2014   GERD  (gastroesophageal reflux disease)    Hiatal hernia    HTN (hypertension)    Hypokalemia 06/2018   Hyponatremia 06/2018   LBP (low back pain) 03/26/2014   Neck pain 11/01/2013   Parkinson disease (HCC)    Pneumonia 06/2018   Sinusitis 02/07/2015   Skin lesions 07/12/2014   Urinary incontinence    mixed    Past Surgical History:  Procedure Laterality Date   ABDOMINAL HYSTERECTOMY     BLEPHAROPLASTY     BREAST BIOPSY  2013   BREAST ENHANCEMENT SURGERY  1987   BREAST IMPLANT REMOVAL     BREAST IMPLANT REMOVAL Right 08/29/2012   BREAST SURGERY  1986   s/p left mastectomy   COLONOSCOPY WITH PROPOFOL N/A 09/13/2016   Procedure: COLONOSCOPY WITH PROPOFOL;  Surgeon: Scot Jun, MD;  Location: [facility] ENDOSCOPY;  Service: Endoscopy;  Laterality: N/A;   MASTECTOMY  08/2012   right   TONSILLECTOMY AND ADENOIDECTOMY  1979   VESICOVAGINAL FISTULA CLOSURE W/ TAH  1983   Family History  Problem Relation Age of Onset   Diabetes Father    Stroke Father    Colon polyps Father    Stroke Mother    Parkinson's disease Mother    Social History   Socioeconomic History   Marital status: Married    Spouse name: Not on file   Martinez of children: 3   Years of education: Not on file   Highest education  Subjective:    Patient ID: Wanda Martinez, female    DOB: [birth year], 88 y.o.   MRN: [id number]  Patient here for her physical exam.   HPI Here for a physical exam. Admitted from 08/22/23 - 08/29/23 - abscess and cellulitis of gluteal region. Initially treated with cefepime-clinda in ED. Then transitioned to Vanc-Cefepime. Added metronidazole 9/8, stopped vancomycin w/ neg MRSA screen. Transitioned cefepime to keflex 08/28/23.  Metronidazole was continued. Discharged on this regimen to complete 10 days. Also had painful lesion - right labia major. The labial lesion has resolved. The abscess/cellulitis - gluteal region - last visit - improved, but still tender and some fullness/pressure sensation. Saw Dr Maia Plan 09/20/23 - mass of buttock - recommended excision.  Schedule for 10/11/23. She is accompanied by her husband.  Reports increased pain - back.  Is followed by pain clinic, but given the infection, etc - she has been unable to get any injections.  She is taking oxycodone.  Discussed tylenol in between doses.  No chest pain reported.  Breathing stable.  No increased cough or congestion.  Eating.  Discussed taking miralax daily to help keep bowels moving. On cipro now.  Taking for urinary symptoms.  Urinary symptoms have improved. Gluteal abscess - improved.     Past Medical History:  Diagnosis Date   Acute postoperative pain 10/25/2017   Anemia    Arm pain 07/26/2015   Arthritis    Arthritis, degenerative 03/26/2014   Back pain 11/01/2013   Bladder infection 06/2018   Breast cancer (HCC)    Masectomy - left - 1986    Breast cancer [facility])    Mastectomy-right -2014   CHEST PAIN 04/29/2010   Qualifier: Diagnosis of  By: Laural Benes RN, Erika     Chronic cystitis    Cystocele 02/22/2013   Cystocele, midline 08/19/2013   Degeneration of intervertebral disc of lumbosacral region 03/26/2014   DYSPNEA 04/29/2010   Qualifier: Diagnosis of  By: Laural Benes RN, Erika     Enthesopathy of hip 03/26/2014   GERD  (gastroesophageal reflux disease)    Hiatal hernia    HTN (hypertension)    Hypokalemia 06/2018   Hyponatremia 06/2018   LBP (low back pain) 03/26/2014   Neck pain 11/01/2013   Parkinson disease (HCC)    Pneumonia 06/2018   Sinusitis 02/07/2015   Skin lesions 07/12/2014   Urinary incontinence    mixed    Past Surgical History:  Procedure Laterality Date   ABDOMINAL HYSTERECTOMY     BLEPHAROPLASTY     BREAST BIOPSY  2013   BREAST ENHANCEMENT SURGERY  1987   BREAST IMPLANT REMOVAL     BREAST IMPLANT REMOVAL Right 08/29/2012   BREAST SURGERY  1986   s/p left mastectomy   COLONOSCOPY WITH PROPOFOL N/A 09/13/2016   Procedure: COLONOSCOPY WITH PROPOFOL;  Surgeon: Scot Jun, MD;  Location: [facility] ENDOSCOPY;  Service: Endoscopy;  Laterality: N/A;   MASTECTOMY  08/2012   right   TONSILLECTOMY AND ADENOIDECTOMY  1979   VESICOVAGINAL FISTULA CLOSURE W/ TAH  1983   Family History  Problem Relation Age of Onset   Diabetes Father    Stroke Father    Colon polyps Father    Stroke Mother    Parkinson's disease Mother    Social History   Socioeconomic History   Marital status: Married    Spouse name: Not on file   Martinez of children: 3   Years of education: Not on file   Highest education  every 6 (six) hours as needed for severe pain or breakthrough pain (PRN for post-radiofrequency pain). Must last 30 days.   pantoprazole (PROTONIX) 40 MG tablet TAKE ONE TABLET BY MOUTH EVERY DAY   rosuvastatin (CRESTOR) 20 MG tablet TAKE ONE TABLET BY MOUTH AT BEDTIME   [DISCONTINUED] amoxicillin-clavulanate (AUGMENTIN) 875-125 MG tablet Take 1  tablet by mouth 2 (two) times daily.   [DISCONTINUED] nitrofurantoin, macrocrystal-monohydrate, (MACROBID) 100 MG capsule Take 1 capsule (100 mg total) by mouth daily.   No facility-administered encounter medications on file as of 10/03/2023.     Lab Results  Component Value Date   WBC 7.7 09/26/2023   HGB 10.8 (L) 09/26/2023   HCT 34.3 (L) 09/26/2023   PLT 237 09/26/2023   GLUCOSE 81 09/26/2023   CHOL 112 09/11/2023   TRIG 96.0 09/11/2023   HDL 47.90 09/11/2023   LDLDIRECT 73.0 07/09/2018   LDLCALC 45 09/11/2023   ALT 19 09/11/2023   AST 25 09/11/2023   NA 132 (L) 09/26/2023   K 4.2 09/26/2023   CL 101 09/26/2023   CREATININE 1.14 (H) 09/26/2023   BUN 33 (H) 09/26/2023   CO2 25 09/26/2023   TSH 2.86 09/11/2023   HGBA1C 5.3 09/11/2023    ECHOCARDIOGRAM COMPLETE  Result Date: 07/10/2023    ECHOCARDIOGRAM REPORT   Patient Name:   Wanda Martinez Date of Exam: 07/10/2023 Medical Rec #:  [id number]           Height:       64.0 in Accession #:    [id number]          Weight:       173.0 lb Date of Birth:  07-Nov-1935          BSA:          1.839 m Patient Age:    87 years            BP:           112/62 mmHg Patient Gender: F                   HR:           50 bpm. Exam Location:  [facility] Procedure: 2D Echo, Cardiac Doppler, Color Doppler and Intracardiac            Opacification Agent Indications:    I47.1 SVT; R53.83 Fatigue; R60.0 Lower extremity edema; R06.02                 SOB  History:        Patient has no prior history of Echocardiogram examinations.                 Arrythmias:SVT, Signs/Symptoms:Fatigue, Edema and Shortness of                 Breath; Risk Factors:Non-Smoker.  Sonographer:    Quentin Ore RDMS, RVT, RDCS Referring Phys: [id number] BRIAN AGBOR-ETANG IMPRESSIONS  1. Left ventricular ejection fraction, by estimation, is 55 to 60%. The left ventricle has normal function. The left ventricle has no regional wall motion abnormalities. There is mild left ventricular  hypertrophy. Left ventricular diastolic parameters are indeterminate.  2. Right ventricular systolic function is normal. The right ventricular size is normal.  3. There is no evidence of cardiac tamponade.  4. The mitral valve is normal in structure. No evidence of mitral valve regurgitation. No evidence of mitral stenosis.  5. The aortic valve has an indeterminant Martinez of cusps.  Subjective:    Patient ID: Wanda Martinez, female    DOB: [birth year], 88 y.o.   MRN: [id number]  Patient here for her physical exam.   HPI Here for a physical exam. Admitted from 08/22/23 - 08/29/23 - abscess and cellulitis of gluteal region. Initially treated with cefepime-clinda in ED. Then transitioned to Vanc-Cefepime. Added metronidazole 9/8, stopped vancomycin w/ neg MRSA screen. Transitioned cefepime to keflex 08/28/23.  Metronidazole was continued. Discharged on this regimen to complete 10 days. Also had painful lesion - right labia major. The labial lesion has resolved. The abscess/cellulitis - gluteal region - last visit - improved, but still tender and some fullness/pressure sensation. Saw Dr Maia Plan 09/20/23 - mass of buttock - recommended excision.  Schedule for 10/11/23. She is accompanied by her husband.  Reports increased pain - back.  Is followed by pain clinic, but given the infection, etc - she has been unable to get any injections.  She is taking oxycodone.  Discussed tylenol in between doses.  No chest pain reported.  Breathing stable.  No increased cough or congestion.  Eating.  Discussed taking miralax daily to help keep bowels moving. On cipro now.  Taking for urinary symptoms.  Urinary symptoms have improved. Gluteal abscess - improved.     Past Medical History:  Diagnosis Date   Acute postoperative pain 10/25/2017   Anemia    Arm pain 07/26/2015   Arthritis    Arthritis, degenerative 03/26/2014   Back pain 11/01/2013   Bladder infection 06/2018   Breast cancer (HCC)    Masectomy - left - 1986    Breast cancer [facility])    Mastectomy-right -2014   CHEST PAIN 04/29/2010   Qualifier: Diagnosis of  By: Laural Benes RN, Erika     Chronic cystitis    Cystocele 02/22/2013   Cystocele, midline 08/19/2013   Degeneration of intervertebral disc of lumbosacral region 03/26/2014   DYSPNEA 04/29/2010   Qualifier: Diagnosis of  By: Laural Benes RN, Erika     Enthesopathy of hip 03/26/2014   GERD  (gastroesophageal reflux disease)    Hiatal hernia    HTN (hypertension)    Hypokalemia 06/2018   Hyponatremia 06/2018   LBP (low back pain) 03/26/2014   Neck pain 11/01/2013   Parkinson disease (HCC)    Pneumonia 06/2018   Sinusitis 02/07/2015   Skin lesions 07/12/2014   Urinary incontinence    mixed    Past Surgical History:  Procedure Laterality Date   ABDOMINAL HYSTERECTOMY     BLEPHAROPLASTY     BREAST BIOPSY  2013   BREAST ENHANCEMENT SURGERY  1987   BREAST IMPLANT REMOVAL     BREAST IMPLANT REMOVAL Right 08/29/2012   BREAST SURGERY  1986   s/p left mastectomy   COLONOSCOPY WITH PROPOFOL N/A 09/13/2016   Procedure: COLONOSCOPY WITH PROPOFOL;  Surgeon: Scot Jun, MD;  Location: [facility] ENDOSCOPY;  Service: Endoscopy;  Laterality: N/A;   MASTECTOMY  08/2012   right   TONSILLECTOMY AND ADENOIDECTOMY  1979   VESICOVAGINAL FISTULA CLOSURE W/ TAH  1983   Family History  Problem Relation Age of Onset   Diabetes Father    Stroke Father    Colon polyps Father    Stroke Mother    Parkinson's disease Mother    Social History   Socioeconomic History   Marital status: Married    Spouse name: Not on file   Martinez of children: 3   Years of education: Not on file   Highest education  Subjective:    Patient ID: Wanda Martinez, female    DOB: [birth year], 88 y.o.   MRN: [id number]  Patient here for her physical exam.   HPI Here for a physical exam. Admitted from 08/22/23 - 08/29/23 - abscess and cellulitis of gluteal region. Initially treated with cefepime-clinda in ED. Then transitioned to Vanc-Cefepime. Added metronidazole 9/8, stopped vancomycin w/ neg MRSA screen. Transitioned cefepime to keflex 08/28/23.  Metronidazole was continued. Discharged on this regimen to complete 10 days. Also had painful lesion - right labia major. The labial lesion has resolved. The abscess/cellulitis - gluteal region - last visit - improved, but still tender and some fullness/pressure sensation. Saw Dr Maia Plan 09/20/23 - mass of buttock - recommended excision.  Schedule for 10/11/23. She is accompanied by her husband.  Reports increased pain - back.  Is followed by pain clinic, but given the infection, etc - she has been unable to get any injections.  She is taking oxycodone.  Discussed tylenol in between doses.  No chest pain reported.  Breathing stable.  No increased cough or congestion.  Eating.  Discussed taking miralax daily to help keep bowels moving. On cipro now.  Taking for urinary symptoms.  Urinary symptoms have improved. Gluteal abscess - improved.     Past Medical History:  Diagnosis Date   Acute postoperative pain 10/25/2017   Anemia    Arm pain 07/26/2015   Arthritis    Arthritis, degenerative 03/26/2014   Back pain 11/01/2013   Bladder infection 06/2018   Breast cancer (HCC)    Masectomy - left - 1986    Breast cancer [facility])    Mastectomy-right -2014   CHEST PAIN 04/29/2010   Qualifier: Diagnosis of  By: Laural Benes RN, Erika     Chronic cystitis    Cystocele 02/22/2013   Cystocele, midline 08/19/2013   Degeneration of intervertebral disc of lumbosacral region 03/26/2014   DYSPNEA 04/29/2010   Qualifier: Diagnosis of  By: Laural Benes RN, Erika     Enthesopathy of hip 03/26/2014   GERD  (gastroesophageal reflux disease)    Hiatal hernia    HTN (hypertension)    Hypokalemia 06/2018   Hyponatremia 06/2018   LBP (low back pain) 03/26/2014   Neck pain 11/01/2013   Parkinson disease (HCC)    Pneumonia 06/2018   Sinusitis 02/07/2015   Skin lesions 07/12/2014   Urinary incontinence    mixed    Past Surgical History:  Procedure Laterality Date   ABDOMINAL HYSTERECTOMY     BLEPHAROPLASTY     BREAST BIOPSY  2013   BREAST ENHANCEMENT SURGERY  1987   BREAST IMPLANT REMOVAL     BREAST IMPLANT REMOVAL Right 08/29/2012   BREAST SURGERY  1986   s/p left mastectomy   COLONOSCOPY WITH PROPOFOL N/A 09/13/2016   Procedure: COLONOSCOPY WITH PROPOFOL;  Surgeon: Scot Jun, MD;  Location: [facility] ENDOSCOPY;  Service: Endoscopy;  Laterality: N/A;   MASTECTOMY  08/2012   right   TONSILLECTOMY AND ADENOIDECTOMY  1979   VESICOVAGINAL FISTULA CLOSURE W/ TAH  1983   Family History  Problem Relation Age of Onset   Diabetes Father    Stroke Father    Colon polyps Father    Stroke Mother    Parkinson's disease Mother    Social History   Socioeconomic History   Marital status: Married    Spouse name: Not on file   Martinez of children: 3   Years of education: Not on file   Highest education  Subjective:    Patient ID: Wanda Martinez, female    DOB: [birth year], 88 y.o.   MRN: [id number]  Patient here for her physical exam.   HPI Here for a physical exam. Admitted from 08/22/23 - 08/29/23 - abscess and cellulitis of gluteal region. Initially treated with cefepime-clinda in ED. Then transitioned to Vanc-Cefepime. Added metronidazole 9/8, stopped vancomycin w/ neg MRSA screen. Transitioned cefepime to keflex 08/28/23.  Metronidazole was continued. Discharged on this regimen to complete 10 days. Also had painful lesion - right labia major. The labial lesion has resolved. The abscess/cellulitis - gluteal region - last visit - improved, but still tender and some fullness/pressure sensation. Saw Dr Maia Plan 09/20/23 - mass of buttock - recommended excision.  Schedule for 10/11/23. She is accompanied by her husband.  Reports increased pain - back.  Is followed by pain clinic, but given the infection, etc - she has been unable to get any injections.  She is taking oxycodone.  Discussed tylenol in between doses.  No chest pain reported.  Breathing stable.  No increased cough or congestion.  Eating.  Discussed taking miralax daily to help keep bowels moving. On cipro now.  Taking for urinary symptoms.  Urinary symptoms have improved. Gluteal abscess - improved.     Past Medical History:  Diagnosis Date   Acute postoperative pain 10/25/2017   Anemia    Arm pain 07/26/2015   Arthritis    Arthritis, degenerative 03/26/2014   Back pain 11/01/2013   Bladder infection 06/2018   Breast cancer (HCC)    Masectomy - left - 1986    Breast cancer [facility])    Mastectomy-right -2014   CHEST PAIN 04/29/2010   Qualifier: Diagnosis of  By: Laural Benes RN, Erika     Chronic cystitis    Cystocele 02/22/2013   Cystocele, midline 08/19/2013   Degeneration of intervertebral disc of lumbosacral region 03/26/2014   DYSPNEA 04/29/2010   Qualifier: Diagnosis of  By: Laural Benes RN, Erika     Enthesopathy of hip 03/26/2014   GERD  (gastroesophageal reflux disease)    Hiatal hernia    HTN (hypertension)    Hypokalemia 06/2018   Hyponatremia 06/2018   LBP (low back pain) 03/26/2014   Neck pain 11/01/2013   Parkinson disease (HCC)    Pneumonia 06/2018   Sinusitis 02/07/2015   Skin lesions 07/12/2014   Urinary incontinence    mixed    Past Surgical History:  Procedure Laterality Date   ABDOMINAL HYSTERECTOMY     BLEPHAROPLASTY     BREAST BIOPSY  2013   BREAST ENHANCEMENT SURGERY  1987   BREAST IMPLANT REMOVAL     BREAST IMPLANT REMOVAL Right 08/29/2012   BREAST SURGERY  1986   s/p left mastectomy   COLONOSCOPY WITH PROPOFOL N/A 09/13/2016   Procedure: COLONOSCOPY WITH PROPOFOL;  Surgeon: Scot Jun, MD;  Location: [facility] ENDOSCOPY;  Service: Endoscopy;  Laterality: N/A;   MASTECTOMY  08/2012   right   TONSILLECTOMY AND ADENOIDECTOMY  1979   VESICOVAGINAL FISTULA CLOSURE W/ TAH  1983   Family History  Problem Relation Age of Onset   Diabetes Father    Stroke Father    Colon polyps Father    Stroke Mother    Parkinson's disease Mother    Social History   Socioeconomic History   Marital status: Married    Spouse name: Not on file   Martinez of children: 3   Years of education: Not on file   Highest education  every 6 (six) hours as needed for severe pain or breakthrough pain (PRN for post-radiofrequency pain). Must last 30 days.   pantoprazole (PROTONIX) 40 MG tablet TAKE ONE TABLET BY MOUTH EVERY DAY   rosuvastatin (CRESTOR) 20 MG tablet TAKE ONE TABLET BY MOUTH AT BEDTIME   [DISCONTINUED] amoxicillin-clavulanate (AUGMENTIN) 875-125 MG tablet Take 1  tablet by mouth 2 (two) times daily.   [DISCONTINUED] nitrofurantoin, macrocrystal-monohydrate, (MACROBID) 100 MG capsule Take 1 capsule (100 mg total) by mouth daily.   No facility-administered encounter medications on file as of 10/03/2023.     Lab Results  Component Value Date   WBC 7.7 09/26/2023   HGB 10.8 (L) 09/26/2023   HCT 34.3 (L) 09/26/2023   PLT 237 09/26/2023   GLUCOSE 81 09/26/2023   CHOL 112 09/11/2023   TRIG 96.0 09/11/2023   HDL 47.90 09/11/2023   LDLDIRECT 73.0 07/09/2018   LDLCALC 45 09/11/2023   ALT 19 09/11/2023   AST 25 09/11/2023   NA 132 (L) 09/26/2023   K 4.2 09/26/2023   CL 101 09/26/2023   CREATININE 1.14 (H) 09/26/2023   BUN 33 (H) 09/26/2023   CO2 25 09/26/2023   TSH 2.86 09/11/2023   HGBA1C 5.3 09/11/2023    ECHOCARDIOGRAM COMPLETE  Result Date: 07/10/2023    ECHOCARDIOGRAM REPORT   Patient Name:   Wanda Martinez Date of Exam: 07/10/2023 Medical Rec #:  [id number]           Height:       64.0 in Accession #:    [id number]          Weight:       173.0 lb Date of Birth:  07-Nov-1935          BSA:          1.839 m Patient Age:    87 years            BP:           112/62 mmHg Patient Gender: F                   HR:           50 bpm. Exam Location:  [facility] Procedure: 2D Echo, Cardiac Doppler, Color Doppler and Intracardiac            Opacification Agent Indications:    I47.1 SVT; R53.83 Fatigue; R60.0 Lower extremity edema; R06.02                 SOB  History:        Patient has no prior history of Echocardiogram examinations.                 Arrythmias:SVT, Signs/Symptoms:Fatigue, Edema and Shortness of                 Breath; Risk Factors:Non-Smoker.  Sonographer:    Quentin Ore RDMS, RVT, RDCS Referring Phys: [id number] BRIAN AGBOR-ETANG IMPRESSIONS  1. Left ventricular ejection fraction, by estimation, is 55 to 60%. The left ventricle has normal function. The left ventricle has no regional wall motion abnormalities. There is mild left ventricular  hypertrophy. Left ventricular diastolic parameters are indeterminate.  2. Right ventricular systolic function is normal. The right ventricular size is normal.  3. There is no evidence of cardiac tamponade.  4. The mitral valve is normal in structure. No evidence of mitral valve regurgitation. No evidence of mitral stenosis.  5. The aortic valve has an indeterminant Martinez of cusps.  every 6 (six) hours as needed for severe pain or breakthrough pain (PRN for post-radiofrequency pain). Must last 30 days.   pantoprazole (PROTONIX) 40 MG tablet TAKE ONE TABLET BY MOUTH EVERY DAY   rosuvastatin (CRESTOR) 20 MG tablet TAKE ONE TABLET BY MOUTH AT BEDTIME   [DISCONTINUED] amoxicillin-clavulanate (AUGMENTIN) 875-125 MG tablet Take 1  tablet by mouth 2 (two) times daily.   [DISCONTINUED] nitrofurantoin, macrocrystal-monohydrate, (MACROBID) 100 MG capsule Take 1 capsule (100 mg total) by mouth daily.   No facility-administered encounter medications on file as of 10/03/2023.     Lab Results  Component Value Date   WBC 7.7 09/26/2023   HGB 10.8 (L) 09/26/2023   HCT 34.3 (L) 09/26/2023   PLT 237 09/26/2023   GLUCOSE 81 09/26/2023   CHOL 112 09/11/2023   TRIG 96.0 09/11/2023   HDL 47.90 09/11/2023   LDLDIRECT 73.0 07/09/2018   LDLCALC 45 09/11/2023   ALT 19 09/11/2023   AST 25 09/11/2023   NA 132 (L) 09/26/2023   K 4.2 09/26/2023   CL 101 09/26/2023   CREATININE 1.14 (H) 09/26/2023   BUN 33 (H) 09/26/2023   CO2 25 09/26/2023   TSH 2.86 09/11/2023   HGBA1C 5.3 09/11/2023    ECHOCARDIOGRAM COMPLETE  Result Date: 07/10/2023    ECHOCARDIOGRAM REPORT   Patient Name:   Wanda Martinez Date of Exam: 07/10/2023 Medical Rec #:  [id number]           Height:       64.0 in Accession #:    [id number]          Weight:       173.0 lb Date of Birth:  07-Nov-1935          BSA:          1.839 m Patient Age:    87 years            BP:           112/62 mmHg Patient Gender: F                   HR:           50 bpm. Exam Location:  [facility] Procedure: 2D Echo, Cardiac Doppler, Color Doppler and Intracardiac            Opacification Agent Indications:    I47.1 SVT; R53.83 Fatigue; R60.0 Lower extremity edema; R06.02                 SOB  History:        Patient has no prior history of Echocardiogram examinations.                 Arrythmias:SVT, Signs/Symptoms:Fatigue, Edema and Shortness of                 Breath; Risk Factors:Non-Smoker.  Sonographer:    Quentin Ore RDMS, RVT, RDCS Referring Phys: [id number] BRIAN AGBOR-ETANG IMPRESSIONS  1. Left ventricular ejection fraction, by estimation, is 55 to 60%. The left ventricle has normal function. The left ventricle has no regional wall motion abnormalities. There is mild left ventricular  hypertrophy. Left ventricular diastolic parameters are indeterminate.  2. Right ventricular systolic function is normal. The right ventricular size is normal.  3. There is no evidence of cardiac tamponade.  4. The mitral valve is normal in structure. No evidence of mitral valve regurgitation. No evidence of mitral stenosis.  5. The aortic valve has an indeterminant Martinez of cusps.

## 2023-10-04 DIAGNOSIS — F32A Depression, unspecified: Secondary | ICD-10-CM | POA: Diagnosis not present

## 2023-10-04 DIAGNOSIS — L03317 Cellulitis of buttock: Secondary | ICD-10-CM | POA: Diagnosis not present

## 2023-10-04 DIAGNOSIS — F419 Anxiety disorder, unspecified: Secondary | ICD-10-CM | POA: Diagnosis not present

## 2023-10-04 DIAGNOSIS — L0231 Cutaneous abscess of buttock: Secondary | ICD-10-CM | POA: Diagnosis not present

## 2023-10-04 DIAGNOSIS — D631 Anemia in chronic kidney disease: Secondary | ICD-10-CM | POA: Diagnosis not present

## 2023-10-04 DIAGNOSIS — N1831 Chronic kidney disease, stage 3a: Secondary | ICD-10-CM | POA: Diagnosis not present

## 2023-10-04 DIAGNOSIS — I129 Hypertensive chronic kidney disease with stage 1 through stage 4 chronic kidney disease, or unspecified chronic kidney disease: Secondary | ICD-10-CM | POA: Diagnosis not present

## 2023-10-04 DIAGNOSIS — M810 Age-related osteoporosis without current pathological fracture: Secondary | ICD-10-CM | POA: Diagnosis not present

## 2023-10-04 DIAGNOSIS — E7800 Pure hypercholesterolemia, unspecified: Secondary | ICD-10-CM | POA: Diagnosis not present

## 2023-10-05 DIAGNOSIS — F32A Depression, unspecified: Secondary | ICD-10-CM | POA: Diagnosis not present

## 2023-10-05 DIAGNOSIS — E7800 Pure hypercholesterolemia, unspecified: Secondary | ICD-10-CM | POA: Diagnosis not present

## 2023-10-05 DIAGNOSIS — L03317 Cellulitis of buttock: Secondary | ICD-10-CM | POA: Diagnosis not present

## 2023-10-05 DIAGNOSIS — M810 Age-related osteoporosis without current pathological fracture: Secondary | ICD-10-CM | POA: Diagnosis not present

## 2023-10-05 DIAGNOSIS — F419 Anxiety disorder, unspecified: Secondary | ICD-10-CM | POA: Diagnosis not present

## 2023-10-05 DIAGNOSIS — N1831 Chronic kidney disease, stage 3a: Secondary | ICD-10-CM | POA: Diagnosis not present

## 2023-10-05 DIAGNOSIS — I129 Hypertensive chronic kidney disease with stage 1 through stage 4 chronic kidney disease, or unspecified chronic kidney disease: Secondary | ICD-10-CM | POA: Diagnosis not present

## 2023-10-05 DIAGNOSIS — L0231 Cutaneous abscess of buttock: Secondary | ICD-10-CM | POA: Diagnosis not present

## 2023-10-05 DIAGNOSIS — D631 Anemia in chronic kidney disease: Secondary | ICD-10-CM | POA: Diagnosis not present

## 2023-10-07 ENCOUNTER — Encounter: Payer: Self-pay | Admitting: Internal Medicine

## 2023-10-07 NOTE — Assessment & Plan Note (Signed)
zometa - per Dr Grayland Ormond

## 2023-10-07 NOTE — Assessment & Plan Note (Signed)
On crestor.  Follow lipid panel and liver function tests.   

## 2023-10-07 NOTE — Assessment & Plan Note (Signed)
Follow up - 06/2023 - History of paroxysmal SVT, echo 7/24 EF 55 to 60%.  Continue Toprol-XL 50 daily.

## 2023-10-07 NOTE — Assessment & Plan Note (Signed)
Blood pressure as outlined. On metoprolol, amlodipine and losartan. Follow pressures.  Follow metabolic panel.

## 2023-10-07 NOTE — Assessment & Plan Note (Signed)
Continue citalopram.  Overall appears to be handling things well.  Follow.   

## 2023-10-07 NOTE — Assessment & Plan Note (Signed)
Hematology 04/2023 - retacrit.  F/u. Retacrit if her hemoglobin remains below 10.0.  follow cbc.

## 2023-10-07 NOTE — Assessment & Plan Note (Signed)
Much improved.  Small firm area, but no increased erythema.  Has f/u with Dr Maia Plan next week.

## 2023-10-07 NOTE — Assessment & Plan Note (Signed)
Continue crestor 

## 2023-10-07 NOTE — Assessment & Plan Note (Signed)
Low carb diet and exercise.  Follow met b and a1c.  

## 2023-10-07 NOTE — Assessment & Plan Note (Signed)
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  Patient completed 5 years of letrozole in May 2019.  No further interventions are needed.  Patient has had bilateral mastectomies, therefore no further mammograms are necessary.  ?

## 2023-10-07 NOTE — Assessment & Plan Note (Addendum)
Avoid antiinflammatories.  Stay hdrated. Follow metabolic panel today.

## 2023-10-07 NOTE — Assessment & Plan Note (Signed)
No upper symptoms reported.  On protonix.   

## 2023-10-10 ENCOUNTER — Telehealth: Payer: Self-pay | Admitting: Pain Medicine

## 2023-10-10 ENCOUNTER — Encounter: Payer: Medicare HMO | Admitting: Pain Medicine

## 2023-10-10 NOTE — Telephone Encounter (Signed)
PT called wanted to see if she can come in next Tuesday to get knee injection done. PT has an appt schedule for 11-12-23 states that will be to long to wait and she is in a lot of pain. Please give patient a call. TY

## 2023-10-11 ENCOUNTER — Inpatient Hospital Stay: Payer: Medicare HMO | Admitting: Internal Medicine

## 2023-10-11 DIAGNOSIS — S8011XA Contusion of right lower leg, initial encounter: Secondary | ICD-10-CM | POA: Diagnosis not present

## 2023-10-11 DIAGNOSIS — R350 Frequency of micturition: Secondary | ICD-10-CM | POA: Diagnosis not present

## 2023-10-15 ENCOUNTER — Telehealth: Payer: Self-pay | Admitting: Pain Medicine

## 2023-10-15 NOTE — Telephone Encounter (Signed)
PT called wantes to know id she can get an injection in her shoulder on tomorrow instead of the knee.

## 2023-10-16 ENCOUNTER — Ambulatory Visit: Payer: Medicare HMO | Attending: Pain Medicine | Admitting: Pain Medicine

## 2023-10-16 ENCOUNTER — Encounter: Payer: Self-pay | Admitting: Pain Medicine

## 2023-10-16 ENCOUNTER — Ambulatory Visit
Admission: RE | Admit: 2023-10-16 | Discharge: 2023-10-16 | Disposition: A | Discharge status: Home / Self Care | Payer: Medicare HMO | Source: Ambulatory Visit | Attending: Pain Medicine | Admitting: Pain Medicine

## 2023-10-16 VITALS — BP 158/76 | HR 52 | Temp 97.1°F | Resp 12 | Ht 63.0 in | Wt 176.0 lb

## 2023-10-16 DIAGNOSIS — M25511 Pain in right shoulder: Secondary | ICD-10-CM | POA: Insufficient documentation

## 2023-10-16 DIAGNOSIS — M545 Low back pain, unspecified: Secondary | ICD-10-CM | POA: Insufficient documentation

## 2023-10-16 DIAGNOSIS — S46001S Unspecified injury of muscle(s) and tendon(s) of the rotator cuff of right shoulder, sequela: Secondary | ICD-10-CM | POA: Insufficient documentation

## 2023-10-16 DIAGNOSIS — G8929 Other chronic pain: Secondary | ICD-10-CM | POA: Insufficient documentation

## 2023-10-16 DIAGNOSIS — M47816 Spondylosis without myelopathy or radiculopathy, lumbar region: Secondary | ICD-10-CM | POA: Insufficient documentation

## 2023-10-16 DIAGNOSIS — M549 Dorsalgia, unspecified: Secondary | ICD-10-CM | POA: Diagnosis not present

## 2023-10-16 DIAGNOSIS — M19011 Primary osteoarthritis, right shoulder: Secondary | ICD-10-CM | POA: Insufficient documentation

## 2023-10-16 MED ORDER — ROPIVACAINE HCL 2 MG/ML IJ SOLN
9.0000 mL | Freq: Once | INTRAMUSCULAR | Status: AC
  Administered 2023-10-16: 9 mL via INTRA_ARTICULAR
  Filled 2023-10-16: qty 20

## 2023-10-16 MED ORDER — LACTATED RINGERS IV SOLN
Freq: Once | INTRAVENOUS | Status: DC
Start: 2023-10-16 — End: 2023-10-16

## 2023-10-16 MED ORDER — METHYLPREDNISOLONE ACETATE 80 MG/ML IJ SUSP
80.0000 mg | Freq: Once | INTRAMUSCULAR | Status: AC
  Administered 2023-10-16: 80 mg via INTRA_ARTICULAR
  Filled 2023-10-16: qty 1

## 2023-10-16 MED ORDER — MIDAZOLAM HCL 2 MG/2ML IJ SOLN: 0.5000 mg | Freq: Once | INTRAMUSCULAR | Status: DC

## 2023-10-16 MED ORDER — LIDOCAINE HCL 2 % IJ SOLN
20.0000 mL | Freq: Once | INTRAMUSCULAR | Status: AC
  Administered 2023-10-16: 400 mg
  Filled 2023-10-16: qty 20

## 2023-10-16 MED ORDER — PENTAFLUOROPROP-TETRAFLUOROETH EX AERO
INHALATION_SPRAY | Freq: Once | CUTANEOUS | Status: AC
  Administered 2023-10-16: 30 via TOPICAL
  Filled 2023-10-16: qty 30

## 2023-10-16 NOTE — Patient Instructions (Addendum)
______________________________________________________________________    Post-Procedure Discharge Instructions  Instructions: Apply ice:  Purpose: This will minimize any swelling and discomfort after procedure.  When: Day of procedure, as soon as you get home. How: Fill a plastic sandwich bag with crushed ice. Cover it with a small towel and apply to injection site. How long: (15 min on, 15 min off) Apply for 15 minutes then remove x 15 minutes.  Repeat sequence on day of procedure, until you go to bed. Apply heat:  Purpose: To treat any soreness and discomfort from the procedure. When: Starting the next day after the procedure. How: Apply heat to procedure site starting the day following the procedure. How long: May continue to repeat daily, until discomfort goes away. Food intake: Start with clear liquids (like water) and advance to regular food, as tolerated.  Physical activities: Keep activities to a minimum for the first 8 hours after the procedure. After that, then as tolerated. Driving: If you have received any sedation, be responsible and do not drive. You are not allowed to drive for 24 hours after having sedation. Blood thinner: (Applies only to those taking blood thinners) You may restart your blood thinner 6 hours after your procedure. Insulin: (Applies only to Diabetic patients taking insulin) As soon as you can eat, you may resume your normal dosing schedule. Infection prevention: Keep procedure site clean and dry. Shower daily and clean area with soap and water. Post-procedure Pain Diary: Extremely important that this be done correctly and accurately. Recorded information will be used to determine the next step in treatment. For the purpose of accuracy, follow these rules: Evaluate only the area treated. Do not report or include pain from an untreated area. For the purpose of this evaluation, ignore all other areas of pain, except for the treated area. After your procedure,  avoid taking a long nap and attempting to complete the pain diary after you wake up. Instead, set your alarm clock to go off every hour, on the hour, for the initial 8 hours after the procedure. Document the duration of the numbing medicine, and the relief you are getting from it. Do not go to sleep and attempt to complete it later. It will not be accurate. If you received sedation, it is likely that you were given a medication that may cause amnesia. Because of this, completing the diary at a later time may cause the information to be inaccurate. This information is needed to plan your care. Follow-up appointment: Keep your post-procedure follow-up evaluation appointment after the procedure (usually 2 weeks for most procedures, 6 weeks for radiofrequencies). DO NOT FORGET to bring you pain diary with you.   Expect: (What should I expect to see with my procedure?) From numbing medicine (AKA: Local Anesthetics): Numbness or decrease in pain. You may also experience some weakness, which if present, could last for the duration of the local anesthetic. Onset: Full effect within 15 minutes of injected. Duration: It will depend on the type of local anesthetic used. On the average, 1 to 8 hours.  From steroids (Applies only if steroids were used): Decrease in swelling or inflammation. Once inflammation is improved, relief of the pain will follow. Onset of benefits: Depends on the amount of swelling present. The more swelling, the longer it will take for the benefits to be seen. In some cases, up to 10 days. Duration: Steroids will stay in the system x 2 weeks. Duration of benefits will depend on multiple posibilities including persistent irritating factors. Side-effects: If  present, they may typically last 2 weeks (the duration of the steroids). Frequent: Cramps (if they occur, drink Gatorade and take over-the-counter Magnesium 450-500 mg once to twice a day); water retention with temporary weight gain;  increases in blood sugar; decreased immune system response; increased appetite. Occasional: Facial flushing (red, warm cheeks); mood swings; menstrual changes. Uncommon: Long-term decrease or suppression of natural hormones; bone thinning. (These are more common with higher doses or more frequent use. This is why we prefer that our patients avoid having any injection therapies in other practices.)  Very Rare: Severe mood changes; psychosis; aseptic necrosis. From procedure: Some discomfort is to be expected once the numbing medicine wears off. This should be minimal if ice and heat are applied as instructed.  Call if: (When should I call?) You experience numbness and weakness that gets worse with time, as opposed to wearing off. New onset bowel or bladder incontinence. (Applies only to procedures done in the spine)  Emergency Numbers: Durning business hours (Monday - Thursday, 8:00 AM - 4:00 PM) (Friday, 9:00 AM - 12:00 Noon): [phone] After hours: [phone] NOTE: If you are having a problem and are unable connect with, or to talk to a provider, then go to your nearest urgent care or emergency department. If the problem is serious and urgent, please call 911. ______________________________________________________________________     ______________________________________________________________________    Procedure instructions  Stop blood-thinners  Do not eat or drink fluids (other than water) for 6 hours before your procedure  No water for 2 hours before your procedure  Take your blood pressure medicine with a sip of water  Arrive 30 minutes before your appointment  If sedation is planned, bring suitable driver. Pennie Banter, Benedetto Goad, & public transportation are NOT APPROVED)  Carefully read the "Preparing for your procedure" detailed instructions  If you have questions call us at [phone]  ______________________________________________________________________       ______________________________________________________________________    Preparing for your procedure  Appointments: If you think you may not be able to keep your appointment, call 24-48 hours in advance to cancel. We need time to make it available to others.  During your procedure appointment there will be: No Prescription Refills. No disability issues to discussed. No medication changes or discussions.  Instructions: Food intake: Avoid eating anything solid for at least 8 hours prior to your procedure. Clear liquid intake: You may take clear liquids such as water up to 2 hours prior to your procedure. (No carbonated drinks. No soda.) Transportation: Unless otherwise stated by your physician, bring a driver. (Driver cannot be a Market researcher, Pharmacist, community, or any other form of public transportation.) Morning Medicines: Except for blood thinners, take all of your other morning medications with a sip of water. Make sure to take your heart and blood pressure medicines. If your blood pressure's lower number is above 100, the case will be rescheduled. Blood thinners: Make sure to stop your blood thinners as instructed.  If you take a blood thinner, but were not instructed to stop it, call our office [phone] and ask to talk to a nurse. Not stopping a blood thinner prior to certain procedures could lead to serious complications. Diabetics on insulin: Notify the staff so that you can be scheduled 1st case in the morning. If your diabetes requires high dose insulin, take only  of your normal insulin dose the morning of the procedure and notify the staff that you have done so. Preventing infections: Shower with an antibacterial soap the  morning of your procedure.  Build-up your immune system: Take 1000 mg of Vitamin C with every meal (3 times a day) the day prior to your procedure. Antibiotics: Inform the nursing staff if you are taking any antibiotics or if you have any conditions that may require antibiotics  prior to procedures. (Example: recent joint implants)   Pregnancy: If you are pregnant make sure to notify the nursing staff. Not doing so may result in injury to the fetus, including death.  Sickness: If you have a cold, fever, or any active infections, call and cancel or reschedule your procedure. Receiving steroids while having an infection may result in complications. Arrival: You must be in the facility at least 30 minutes prior to your scheduled procedure. Tardiness: Your scheduled time is also the cutoff time. If you do not arrive at least 15 minutes prior to your procedure, you will be rescheduled.  Children: Do not bring any children with you. Make arrangements to keep them home. Dress appropriately: There is always a possibility that your clothing may get soiled. Avoid long dresses. Valuables: Do not bring any jewelry or valuables.  Reasons to call and reschedule or cancel your procedure: (Following these recommendations will minimize the risk of a serious complication.) Surgeries: Avoid having procedures within 2 weeks of any surgery. (Avoid for 2 weeks before or after any surgery). Flu Shots: Avoid having procedures within 2 weeks of a flu shots or . (Avoid for 2 weeks before or after immunizations). Barium: Avoid having a procedure within 7-10 days after having had a radiological study involving the use of radiological contrast. (Myelograms, Barium swallow or enema study). Heart attacks: Avoid any elective procedures or surgeries for the initial 6 months after a "Myocardial Infarction" (Heart Attack). Blood thinners: It is imperative that you stop these medications before procedures. Let us know if you if you take any blood thinner.  Infection: Avoid procedures during or within two weeks of an infection (including chest colds or gastrointestinal problems). Symptoms associated with infections include: Localized redness, fever, chills, night sweats or profuse sweating, burning sensation  when voiding, cough, congestion, stuffiness, runny nose, sore throat, diarrhea, nausea, vomiting, cold or Flu symptoms, recent or current infections. It is specially important if the infection is over the area that we intend to treat. Heart and lung problems: Symptoms that may suggest an active cardiopulmonary problem include: cough, chest pain, breathing difficulties or shortness of breath, dizziness, ankle swelling, uncontrolled high or unusually low blood pressure, and/or palpitations. If you are experiencing any of these symptoms, cancel your procedure and contact your primary care physician for an evaluation.  Remember:  Regular Business hours are:  Monday to Thursday 8:00 AM to 4:00 PM  Provider's Schedule: [name], MD:  Procedure days: Tuesday and Thursday 7:30 AM to 4:00 PM  Edward Jolly, MD:  Procedure days: Monday and Wednesday 7:30 AM to 4:00 PM Last  Updated: 08/07/2023 ______________________________________________________________________      ______________________________________________________________________    General Risks and Possible Complications  Patient Responsibilities: It is important that you read this as it is part of your informed consent. It is our duty to inform you of the risks and possible complications associated with treatments offered to you. It is your responsibility as a patient to read this and to ask questions about anything that is not clear or that you believe was not covered in this document.  Patient's Rights: You have the right to refuse treatment. You also have the right to change your  mind, even after initially having agreed to have the treatment done. However, under this last option, if you wait until the last second to change your mind, you may be charged for the materials used up to that point.  Introduction: Medicine is not an Visual merchandiser. Everything in Medicine, including the lack of treatment(s), carries the potential for  danger, harm, or loss (which is by definition: Risk). In Medicine, a complication is a secondary problem, condition, or disease that can aggravate an already existing one. All treatments carry the risk of possible complications. The fact that a side effects or complications occurs, does not imply that the treatment was conducted incorrectly. It must be clearly understood that these can happen even when everything is done following the highest safety standards.  No treatment: You can choose not to proceed with the proposed treatment alternative. The "PRO(s)" would include: avoiding the risk of complications associated with the therapy. The "CON(s)" would include: not getting any of the treatment benefits. These benefits fall under one of three categories: diagnostic; therapeutic; and/or palliative. Diagnostic benefits include: getting information which can ultimately lead to improvement of the disease or symptom(s). Therapeutic benefits are those associated with the successful treatment of the disease. Finally, palliative benefits are those related to the decrease of the primary symptoms, without necessarily curing the condition (example: decreasing the pain from a flare-up of a chronic condition, such as incurable terminal cancer).  General Risks and Complications: These are associated to most interventional treatments. They can occur alone, or in combination. They fall under one of the following six (6) categories: no benefit or worsening of symptoms; bleeding; infection; nerve damage; allergic reactions; and/or death. No benefits or worsening of symptoms: In Medicine there are no guarantees, only probabilities. No healthcare provider can ever guarantee that a medical treatment will work, they can only state the probability that it may. Furthermore, there is always the possibility that the condition may worsen, either directly, or indirectly, as a consequence of the treatment. Bleeding: This is more common if  the patient is taking a blood thinner, either prescription or over the counter (example: Goody Powders, Fish oil, Aspirin, Garlic, etc.), or if suffering a condition associated with impaired coagulation (example: Hemophilia, cirrhosis of the liver, low platelet counts, etc.). However, even if you do not have one on these, it can still happen. If you have any of these conditions, or take one of these drugs, make sure to notify your treating physician. Infection: This is more common in patients with a compromised immune system, either due to disease (example: diabetes, cancer, human immunodeficiency virus [HIV], etc.), or due to medications or treatments (example: therapies used to treat cancer and rheumatological diseases). However, even if you do not have one on these, it can still happen. If you have any of these conditions, or take one of these drugs, make sure to notify your treating physician. Nerve Damage: This is more common when the treatment is an invasive one, but it can also happen with the use of medications, such as those used in the treatment of cancer. The damage can occur to small secondary nerves, or to large primary ones, such as those in the spinal cord and brain. This damage may be temporary or permanent and it may lead to impairments that can range from temporary numbness to permanent paralysis and/or brain death. Allergic Reactions: Any time a substance or material comes in contact with our body, there is the possibility of an allergic reaction. These can  range from a mild skin rash (contact dermatitis) to a severe systemic reaction (anaphylactic reaction), which can result in death. Death: In general, any medical intervention can result in death, most of the time due to an unforeseen complication. ______________________________________________________________________

## 2023-10-16 NOTE — Addendum Note (Signed)
Addended by: [name] A on: 10/16/2023 12:48 PM   Modules accepted: Orders

## 2023-10-16 NOTE — Progress Notes (Addendum)
PROVIDER NOTE: Interpretation of information contained herein should be left to medically-trained personnel. Specific patient instructions are provided elsewhere under "Patient Instructions" section of medical record. This document was created in part using STT-dictation technology, any transcriptional errors that may result from this process are unintentional.  Patient: Wanda Martinez Type: Established DOB: 06-05-1935 MRN: [id number] PCP: Dale Warsaw, MD  Service: Procedure DOS: 10/16/2023 Setting: Ambulatory Location: Ambulatory outpatient facility Delivery: Face-to-face Provider: Oswaldo Done, MD Specialty: Interventional Pain Management Specialty designation: 09 Location: Outpatient facility Ref. Prov.: Dale Benton Ridge, MD       Interventional Therapy   Procedure: Suprascapular nerve block (SSNB) #2  Laterality:  Right  Level: Superior to scapular spine, lateral to supraspinatus fossa (Suprascapular notch).  Imaging: Fluoroscopic guidance Non-spinal ([id number]) Anesthesia: Local anesthesia (1-2% Lidocaine) Anxiolysis: None                 Sedation:                         DOS: 10/16/2023  Performed by: Oswaldo Done, MD  Purpose: Diagnostic/Therapeutic Indications: Shoulder pain, severe enough to impact quality of life and/or function. 1. Chronic shoulder pain (Right)   2. Chronic upper back pain (Right)   3. Osteoarthritis of shoulder (Right)   4. Osteoarthritis of AC (acromioclavicular) joint (Right)   5. Unspecified injury of muscle(s) and tendon(s) of the rotator cuff of shoulder, sequela (Right)    NAS-11 score:   Pre-procedure: 8 /10   Post-procedure: 0-No pain/10     Target: Suprascapular nerve Location: midway between the medial border of the scapula and the acromion as it runs through the suprascapular notch. Region: Suprascapular, posterior shoulder  Approach: Percutaneous  Neuroanatomy: The suprascapular nerve is the lateral branch of the  superior trunk of the brachial plexus. It receives nerve fibers that originate in the nerve roots C5 and C6 (and sometimes C4). It is a mixed nerve, meaning that it provides both sensory and motor supply for the suprascapular region. Function: The main function of this nerve is to provide motor innervation for two muscles, the supraspinatus and infraspinatus muscles. They are part of the rotator cuff muscles. In addition, the suprascapular nerve provides a sensory supply to the joints of the scapula (glenohumeral and acromioclavicular joints). Rationale (medical necessity): procedure needed and proper for the diagnosis and/or treatment of the patient's medical symptoms and needs.  Position / Prep / Materials:  Position: Prone Materials:  Tray: Block Needle(s):  Type: Spinal  Gauge (G): 22  Length: 3.5 in.  Qty: 1 Prep solution: ChloraPrep (2% chlorhexidine gluconate and 70% isopropyl alcohol) Prep Area: Entire posterior shoulder area. From upper spine to shoulder proper (upper arm), and from lateral neck to lower tip of shoulder blade.   H&P (Pre-op Assessment):  Wanda Martinez is a 88 y.o. (year old), female patient, seen today for interventional treatment. She  has a past surgical history that includes Tonsillectomy and adenoidectomy (1979); Vesicovaginal fistula closure w/ TAH (1983); Breast surgery (1986); Breast enhancement surgery (1987); Breast implant removal; Breast implant removal (Right, 08/29/2012); Mastectomy (08/2012); Abdominal hysterectomy; Colonoscopy with propofol (N/A, 09/13/2016); Breast biopsy (2013); and Blepharoplasty. Wanda Martinez has a current medication list which includes the following prescription(s): amlodipine, aspirin ec, calcium carbonate, citalopram, citalopram, cyclobenzaprine, donepezil, gabapentin, glucosamine-chondroitin, hydrochlorothiazide, letrozole, losartan, lubiprostone, magnesium oxide, melatonin, metoprolol succinate, mirabegron er, multivitamin, naloxone,  omega-3, oxycodone, pantoprazole, and rosuvastatin. Her primarily concern today is the No chief complaint on file.  Initial Vital Signs:  Pulse/HCG Rate: (!) 52ECG Heart Rate: (!) 56 Temp: (!) 97.1 F (36.2 C) Resp: 14 BP: 137/66 SpO2: 98 %  BMI: Estimated body mass index is 31.18 kg/m as calculated from the following:   Height as of this encounter: 5\' 3"  (1.6 m).   Weight as of this encounter: 176 lb (79.8 kg).  Risk Assessment: Allergies: Reviewed. She is allergic to sulfa antibiotics and vesicare [solifenacin].  Allergy Precautions: None required Coagulopathies: Reviewed. None identified.  Blood-thinner therapy: None at this time Active Infection(s): Reviewed. None identified. Wanda Martinez is afebrile  Site Confirmation: Ms. Ort was asked to confirm the procedure and laterality before marking the site Procedure checklist: Completed Consent: Before the procedure and under the influence of no sedative(s), amnesic(s), or anxiolytics, the patient was informed of the treatment options, risks and possible complications. To fulfill our ethical and legal obligations, as recommended by the American Medical Association's Code of Ethics, I have informed the patient of my clinical impression; the nature and purpose of the treatment or procedure; the risks, benefits, and possible complications of the intervention; the alternatives, including doing nothing; the risk(s) and benefit(s) of the alternative treatment(s) or procedure(s); and the risk(s) and benefit(s) of doing nothing. The patient was provided information about the general risks and possible complications associated with the procedure. These may include, but are not limited to: failure to achieve desired goals, infection, bleeding, organ or nerve damage, allergic reactions, paralysis, and death. In addition, the patient was informed of those risks and complications associated to the procedure, such as failure to decrease pain; infection;  bleeding; organ or nerve damage with subsequent damage to sensory, motor, and/or autonomic systems, resulting in permanent pain, numbness, and/or weakness of one or several areas of the body; allergic reactions; (i.e.: anaphylactic reaction); and/or death. Furthermore, the patient was informed of those risks and complications associated with the medications. These include, but are not limited to: allergic reactions (i.e.: anaphylactic or anaphylactoid reaction(s)); adrenal axis suppression; blood sugar elevation that in diabetics may result in ketoacidosis or comma; water retention that in patients with history of congestive heart failure may result in shortness of breath, pulmonary edema, and decompensation with resultant heart failure; weight gain; swelling or edema; medication-induced neural toxicity; particulate matter embolism and blood vessel occlusion with resultant organ, and/or nervous system infarction; and/or aseptic necrosis of one or more joints. Finally, the patient was informed that Medicine is not an exact science; therefore, there is also the possibility of unforeseen or unpredictable risks and/or possible complications that may result in a catastrophic outcome. The patient indicated having understood very clearly. We have given the patient no guarantees and we have made no promises. Enough time was given to the patient to ask questions, all of which were answered to the patient's satisfaction. Ms. Lappen has indicated that she wanted to continue with the procedure. Attestation: I, the ordering provider, attest that I have discussed with the patient the benefits, risks, side-effects, alternatives, likelihood of achieving goals, and potential problems during recovery for the procedure that I have provided informed consent. Date  Time: 10/16/2023 11:35 AM  Pre-Procedure Preparation:  Monitoring: As per clinic protocol. Respiration, ETCO2, SpO2, BP, heart rate and rhythm monitor placed and  checked for adequate function Safety Precautions: Patient was assessed for positional comfort and pressure points before starting the procedure. Time-out: I initiated and conducted the "Time-out" before starting the procedure, as per protocol. The patient was asked to participate by confirming the accuracy  of the "Time Out" information. Verification of the correct person, site, and procedure were performed and confirmed by me, the nursing staff, and the patient. "Time-out" conducted as per Joint Commission's Universal Protocol (UP.01.01.01). Time: 1151 Start Time: 1151 hrs.  Description of Procedure:          Procedural Technique Safety Precautions: Aspiration looking for blood return was conducted prior to all injections. At no point did we inject any substances, as a needle was being advanced. No attempts were made at seeking any paresthesias. Safe injection practices and needle disposal techniques used. Medications properly checked for expiration dates. SDV (single dose vial) medications used. Description of the Procedure: Protocol guidelines were followed. The patient was placed in position over the procedure table. The target area was identified and the area prepped in the usual manner. Skin & deeper tissues infiltrated with local anesthetic. Appropriate amount of time allowed to pass for local anesthetics to take effect. The procedure needles were then advanced to the target area. Proper needle placement secured. Negative aspiration confirmed. Solution injected in intermittent fashion, asking for systemic symptoms every 0.5cc of injectate. The needles were then removed and the area cleansed, making sure to leave some of the prepping solution back to take advantage of its long term bactericidal properties.  Vitals:   10/16/23 1130 10/16/23 1150 10/16/23 1155 10/16/23 1158  BP: 137/66 (!) 152/78 (!) 164/74 (!) 158/76  Pulse: (!) 52     Resp: 14 12 11 12   Temp: (!) 97.1 F (36.2 C)     TempSrc:  Temporal     SpO2: 98% 96% 99% 97%  Weight: 176 lb (79.8 kg)     Height: 5\' 3"  (1.6 m)        Start Time: 1151 hrs. End Time: 1157 hrs.  Imaging Guidance (Spinal):          Type of Imaging Technique: Fluoroscopy Guidance (Spinal) Indication(s): Assistance in needle guidance and placement for procedures requiring needle placement in or near specific anatomical locations not easily accessible without such assistance. Exposure Time: Please see nurses notes. Contrast: None used. Fluoroscopic Guidance: I was personally present during the use of fluoroscopy. "Tunnel Vision Technique" used to obtain the best possible view of the target area. Parallax error corrected before commencing the procedure. "Direction-depth-direction" technique used to introduce the needle under continuous pulsed fluoroscopy. Once target was reached, antero-posterior, oblique, and lateral fluoroscopic projection used confirm needle placement in all planes. Images permanently stored in EMR. Interpretation: No contrast injected. I personally interpreted the imaging intraoperatively. Adequate needle placement confirmed in multiple planes. Permanent images saved into the patient's record.  Antibiotic Prophylaxis:   Anti-infectives (From admission, onward)    None      Indication(s): None identified  Post-operative Assessment:  Post-procedure Vital Signs:  Pulse/HCG Rate: (!) 52(!) 56 Temp: (!) 97.1 F (36.2 C) Resp: 12 BP: (!) 158/76 SpO2: 97 %  EBL: None  Complications: No immediate post-treatment complications observed by team, or reported by patient.  Note: The patient tolerated the entire procedure well. A repeat set of vitals were taken after the procedure and the patient was kept under observation following institutional policy, for this type of procedure. Post-procedural neurological assessment was performed, showing return to baseline, prior to discharge. The patient was provided with post-procedure  discharge instructions, including a section on how to identify potential problems. Should any problems arise concerning this procedure, the patient was given instructions to immediately contact us, at any time, without hesitation. In any  case, we plan to contact the patient by telephone for a follow-up status report regarding this interventional procedure.  Comments:  No additional relevant information.  Plan of Care (POC)  Orders:  Orders Placed This Encounter  Procedures   SUPRASCAPULAR NERVE BLOCK    For shoulder pain.    Scheduling Instructions:     Purpose: Therapeutic     Laterality: Right-sided     Level(s): Suprascapular notch     Sedation: Patient's choice.     Scheduling Timeframe: Today    Order Specific Question:   Where will this procedure be performed?    Answer:   [facility]   LUMBAR FACET(MEDIAL BRANCH NERVE BLOCK) MBNB    Diagnosis: Lumbar Facet Syndrome (M47.816); Lumbosacral Facet Syndrome (M47.817); Lumbar Facet Joint Pain (M54.59) Medical Necessity Statement: 1.Severe chronic axial low back pain causing functional impairment documented by ongoing pain scale assessments. 2.Pain present for longer than 3 months (Chronic) documented to have failed noninvasive conservative therapies. 3.Absence of untreated radiculopathy. 4.There is no radiological evidence of untreated fractures, tumor, infection, or deformity.  Physical Examination Findings: Positive Kemp Maneuver: (Y)  Positive Lumbar Hyperextension-Rotation provocative test: (Y)    Standing Status:   Future    Standing Expiration Date:   01/16/2024    Scheduling Instructions:     Procedure: Lumbar facet Block     Type: Medial Branch Block     Side: Bilateral     Purpose: Palliative     Level(s): L3-4, L4-5, and L5-S1 Facets (L2, L3, L4, L5, and S1 Medial Branch)     Sedation: Patient's choice.     Timeframe: In approximately 2 weeks from the    Order Specific Question:   Where will this procedure be  performed?    Answer:   [facility]   DG PAIN CLINIC C-ARM 1-60 MIN NO REPORT    Intraoperative interpretation by procedural physician at [facility].    Standing Status:   Standing    Number of Occurrences:   1    Order Specific Question:   Reason for exam:    Answer:   Assistance in needle guidance and placement for procedures requiring needle placement in or near specific anatomical locations not easily accessible without such assistance.   Informed Consent Details: Physician/Practitioner Attestation; Transcribe to consent form and obtain patient signature    Note: Always confirm laterality of pain with [name], before procedure.    Order Specific Question:   Physician/Practitioner attestation of informed consent for procedure/surgical case    Answer:   I, the physician/practitioner, attest that I have discussed with the patient the benefits, risks, side effects, alternatives, likelihood of achieving goals and potential problems during recovery for the procedure that I have provided informed consent.    Order Specific Question:   Procedure    Answer:   Intra-articular shoulder joint injection under fluoroscopic guidance    Order Specific Question:   Physician/Practitioner performing the procedure    Answer:   Merideth Bosque A. Laban Emperor, MD    Order Specific Question:   Indication/Reason    Answer:   Chronic shoulder pain secondary to shoulder arthropathy   Provide equipment / supplies at bedside    Procedure tray: "Block Tray" (Disposable  single use) Skin infiltration needle: Regular 1.5-in, 25-G, (x1) Block Needle type: Spinal Amount/quantity: 1 Size: Medium (5-inch) Gauge: 22G    Standing Status:   Standing    Number of Occurrences:   1    Order Specific  Question:   Specify    Answer:   Block Tray   Informed Consent Details: Physician/Practitioner Attestation; Transcribe to consent form and obtain patient signature    Note: Always confirm laterality of pain with  [name], before procedure.    Order Specific Question:   Physician/Practitioner attestation of informed consent for procedure/surgical case    Answer:   I, the physician/practitioner, attest that I have discussed with the patient the benefits, risks, side effects, alternatives, likelihood of achieving goals and potential problems during recovery for the procedure that I have provided informed consent.    Order Specific Question:   Procedure    Answer:   Suprascapular Nerve Block    Order Specific Question:   Physician/Practitioner performing the procedure    Answer:   Kayleigh Broadwell A. Laban Emperor, MD    Order Specific Question:   Indication/Reason    Answer:   Chronic shoulder pain (arthralgia)   Chronic Opioid Analgesic:  Oxycodone IR 5 mg, 1 tab PO q 8 hrs (15 mg/day of oxycodone) MME/day: 22.5 mg/day.   Medications ordered for procedure: Meds ordered this encounter  Medications   lidocaine (XYLOCAINE) 2 % (with pres) injection 400 mg   pentafluoroprop-tetrafluoroeth (GEBAUERS) aerosol   DISCONTD: lactated ringers infusion   DISCONTD: midazolam (VERSED) injection 0.5-2 mg    Make sure Flumazenil is available in the pyxis when using this medication. If oversedation occurs, administer 0.2 mg IV over 15 sec. If after 45 sec no response, administer 0.2 mg again over 1 min; may repeat at 1 min intervals; not to exceed 4 doses (1 mg)   ropivacaine (PF) 2 mg/mL (0.2%) (NAROPIN) injection 9 mL   methylPREDNISolone acetate (DEPO-MEDROL) injection 80 mg   Medications administered: We administered lidocaine, pentafluoroprop-tetrafluoroeth, ropivacaine (PF) 2 mg/mL (0.2%), and methylPREDNISolone acetate.  See the medical record for exact dosing, route, and time of administration.  Follow-up plan:   Return in about 2 weeks (around 10/30/2023) for [facility]): (B) L-FCT Blk + PPE.       Interventional Therapies  Risk Factors  Considerations  Medical Comorbidities:  Parkinson's disease  Hx right  breast cancer  OIC  SOB  CKD Stage 3b  GERD  depression  osteopenia/osteoporosis  PSVT  Memory impairment     Planned  Pending:   Therapeutic bilateral lumbar facet block    Under consideration:   Therapeutic bilateral lumbar facet block    Completed:   Therapeutic right trapezius TPI x4 (07/17/2023) (100/100/0/0)  Therapeutic right thoracic TPI x2 (10/17/2018) (DKFUA)  Therapeutic right QL & ESM TPI x1 (02/12/2018)   Diagnostic right SI joint Blk x3 (12/21/2020)  (DKFUA)  Therapeutic right SI joint RFA x4 (03/01/2023) (100/100/70/70)   Therapeutic right lumbar facet MBB (FCT: L3-4,L4-5,L5-S1) x5 (08/09/2021) (100/100/50)  Therapeutic right lumbar facet MBB (FCT: L5-S1) x1 (08/29/2022) (100/100/100/0)  Therapeutic right lumbar facet RFA x4 (03/01/2023) (100/100/70/70)   Therapeutic bilateral L5 and L2 TFESI x1 (07/03/2023) (100/90/50/L:100R:20))  Therapeutic right L3 and L4 TFESI x1 (07/27/2022) (100/100/75/50)  Therapeutic right L5 TFESI x4 (02/14/2022) (100/100/70/75)  Therapeutic right S1 TFESI x1 (11/04/2020) (100/100/20/<50)   Therapeutic right L3-4 LESI x2 (11/04/2020) (100/100/20/<50)  Therapeutic right L4-5 LESI x2 (03/30/2022) (100/100/75/75)  Therapeutic right L5-S1 LESI x2 (12/04/2019) (100/100/50/75)  Therapeutic midline L2-3 LESI x1 (10/04/2017) (100/100/85/>50)   Diagnostic bilateral superficial trochanteric bursa injec. x1 (04/30/2018) (DKFUA)  Diagnostic right IA hip injection x1 (02/13/2019) (DKFUA)   Diagnostic/therapeutic right IA steroid knee inj. x4 (01/10/2023) (100/100/100/80)  Therapeutic right (Zilretta) knee injection  x2 (07/31/2023)  Therapeutic right (Monovisc) knee injection x1 (06/07/2021) (100/100/100/75)   Therapeutic left thoracic TPI x1 (10/17/2018) (DKFUA)  Diagnostic left SI joint Blk x2 (04/12/2023) (100/90/20/<25)   Therapeutic left lumbar facet MBB (L2-S1) (FCT: L3-4,L4-5,L5-S1) x5 (08/09/2021) (100/100/50)  Therapeutic left lumbar  facet MBB (L4-S1) (FCT: L5-S1) x1 (08/29/2022) (100/100/100/100)  Therapeutic left lumbar facet RFA x2 (11/30/2022) (100/60/90/>75)    Therapeutic  Palliative (PRN) options:   Do not schedule any procedures without a proper preprocedure evaluation.   Completed by other providers:   Therapeutic right knee (ORTHOVISC) inj. x3 (05/18/2023, 05/23/2023, 05/30/2023) by Aram Candela, MD [facility]) Therapeutic left knee (Kenalog) inj. x1 (08/25/2020) by Novella Olive, MD [facility])   Pharmacotherapy  Nonopioids transfer 12/21/2020: Gabapentin       Recent Visits Date Type Provider Dept  09/19/23 Office Visit [name], MD [facility]  07/31/23 Office Visit [name], MD [facility]  Showing recent visits within past 90 days and meeting all other requirements Today's Visits Date Type Provider Dept  10/16/23 Procedure visit [name], MD [facility]  Showing today's visits and meeting all other requirements Future Appointments Date Type Provider Dept  11/12/23 Appointment [name], MD [facility]  Showing future appointments within next 90 days and meeting all other requirements  Disposition: Discharge home  Discharge (Date  Time): 10/16/2023; 1204 hrs.   Primary Care Physician: Dale Shenandoah, MD Location: [facility] Note by: Oswaldo Done, MD (TTS technology used. I apologize for any typographical errors that were not detected and corrected.) Date: 10/16/2023; Time: 12:48 PM  Disclaimer:  Medicine is not an Visual merchandiser. The only guarantee in medicine is that nothing is guaranteed. It is important to note that the decision to proceed with this intervention was based on the information collected from the patient. The Data and conclusions were drawn from the patient's questionnaire, the interview, and the physical examination. Because the information was provided in  large part by the patient, it cannot be guaranteed that it has not been purposely or unconsciously manipulated. Every effort has been made to obtain as much relevant data as possible for this evaluation. It is important to note that the conclusions that lead to this procedure are derived in large part from the available data. Always take into account that the treatment will also be dependent on availability of resources and existing treatment guidelines, considered by other Pain Management Practitioners as being common knowledge and practice, at the time of the intervention. For Medico-Legal purposes, it is also important to point out that variation in procedural techniques and pharmacological choices are the acceptable norm. The indications, contraindications, technique, and results of the above procedure should only be interpreted and judged by a Board-Certified Interventional Pain Specialist with extensive familiarity and expertise in the same exact procedure and technique.

## 2023-10-17 ENCOUNTER — Telehealth: Payer: Self-pay

## 2023-10-17 NOTE — Telephone Encounter (Signed)
Post procedure follow up.  Patient states she is doing good.  

## 2023-10-25 ENCOUNTER — Telehealth: Payer: Self-pay | Admitting: Internal Medicine

## 2023-10-25 ENCOUNTER — Other Ambulatory Visit: Payer: Self-pay

## 2023-10-25 ENCOUNTER — Other Ambulatory Visit: Payer: Self-pay | Admitting: Internal Medicine

## 2023-10-25 MED ORDER — GABAPENTIN 300 MG PO CAPS: 300.0000 mg | ORAL_CAPSULE | Freq: Every day | ORAL | 1 refills | Status: DC

## 2023-10-25 MED ORDER — METOPROLOL SUCCINATE ER 25 MG PO TB24: 25.0000 mg | ORAL_TABLET | Freq: Two times a day (BID) | ORAL | 1 refills | Status: DC

## 2023-10-25 NOTE — Telephone Encounter (Signed)
Patient needs 2 medication refills. The first is gabapentin (NEURONTIN) 300 MG capsule and metoprolol succinate (TOPROL-XL) 25 MG 24 hr tablet. The pharmacy she use is [facility] - [place], [place] - [address], [place] [address] Phone: [phone]  Fax: [phone]  Her number is [phone].

## 2023-10-25 NOTE — Telephone Encounter (Signed)
Please refuse. I sent these in for her in a separate encounter.

## 2023-10-25 NOTE — Telephone Encounter (Signed)
Medication refilled. Unable to leave message for patient to let her know,.

## 2023-10-29 ENCOUNTER — Telehealth: Payer: Self-pay | Admitting: Internal Medicine

## 2023-10-29 NOTE — Telephone Encounter (Signed)
Copied from CRM [id number]. Topic: Medicare AWV >> Oct 29, 2023 10:28 AM Payton Doughty wrote: Reason for CRM: Line busy - 10/29/2023  Verlee Rossetti; Care Guide Ambulatory Clinical Support [place] [facility] Direct Dial: [phone]

## 2023-11-01 ENCOUNTER — Ambulatory Visit
Admission: RE | Admit: 2023-11-01 | Discharge: 2023-11-01 | Disposition: A | Discharge status: Home / Self Care | Payer: Medicare HMO | Source: Ambulatory Visit | Attending: Pain Medicine | Admitting: Pain Medicine

## 2023-11-01 ENCOUNTER — Ambulatory Visit: Payer: Medicare HMO | Attending: Pain Medicine | Admitting: Pain Medicine

## 2023-11-01 ENCOUNTER — Encounter: Payer: Self-pay | Admitting: Pain Medicine

## 2023-11-01 VITALS — BP 180/77 | HR 53 | Temp 97.3°F | Resp 14 | Ht 64.0 in | Wt 162.0 lb

## 2023-11-01 DIAGNOSIS — M47817 Spondylosis without myelopathy or radiculopathy, lumbosacral region: Secondary | ICD-10-CM | POA: Insufficient documentation

## 2023-11-01 DIAGNOSIS — M47816 Spondylosis without myelopathy or radiculopathy, lumbar region: Secondary | ICD-10-CM | POA: Diagnosis not present

## 2023-11-01 DIAGNOSIS — M545 Low back pain, unspecified: Secondary | ICD-10-CM | POA: Diagnosis not present

## 2023-11-01 DIAGNOSIS — M5459 Other low back pain: Secondary | ICD-10-CM | POA: Diagnosis not present

## 2023-11-01 DIAGNOSIS — G8929 Other chronic pain: Secondary | ICD-10-CM | POA: Insufficient documentation

## 2023-11-01 MED ORDER — LIDOCAINE HCL 2 % IJ SOLN
20.0000 mL | Freq: Once | INTRAMUSCULAR | Status: AC
  Administered 2023-11-01: 400 mg
  Filled 2023-11-01: qty 40

## 2023-11-01 MED ORDER — ROPIVACAINE HCL 2 MG/ML IJ SOLN
18.0000 mL | Freq: Once | INTRAMUSCULAR | Status: AC
  Administered 2023-11-01: 18 mL via PERINEURAL
  Filled 2023-11-01: qty 20

## 2023-11-01 MED ORDER — TRIAMCINOLONE ACETONIDE 40 MG/ML IJ SUSP
80.0000 mg | Freq: Once | INTRAMUSCULAR | Status: DC
  Filled 2023-11-01: qty 2

## 2023-11-01 MED ORDER — LACTATED RINGERS IV SOLN: Freq: Once | INTRAVENOUS | Status: AC

## 2023-11-01 MED ORDER — MIDAZOLAM HCL 2 MG/2ML IJ SOLN
0.5000 mg | Freq: Once | INTRAMUSCULAR | Status: AC
  Administered 2023-11-01: 2 mg via INTRAVENOUS
  Filled 2023-11-01: qty 2

## 2023-11-01 MED ORDER — PENTAFLUOROPROP-TETRAFLUOROETH EX AERO
INHALATION_SPRAY | Freq: Once | CUTANEOUS | Status: AC
  Administered 2023-11-01: 30 via TOPICAL

## 2023-11-01 NOTE — Progress Notes (Signed)
Safety precautions to be maintained throughout the outpatient stay will include: orient to surroundings, keep bed in low position, maintain call bell within reach at all times, provide assistance with transfer out of bed and ambulation.  

## 2023-11-01 NOTE — Patient Instructions (Signed)

## 2023-11-01 NOTE — Progress Notes (Signed)
PROVIDER NOTE: Interpretation of information contained herein should be left to medically-trained personnel. Specific patient instructions are provided elsewhere under "Patient Instructions" section of medical record. This document was created in part using STT-dictation technology, any transcriptional errors that may result from this process are unintentional.  Patient: Wanda Martinez Type: Established DOB: 1935/09/11 MRN: [id number] PCP: Dale Woodbridge, MD  Service: Procedure DOS: 11/01/2023 Setting: Ambulatory Location: Ambulatory outpatient facility Delivery: Face-to-face Provider: Oswaldo Done, MD Specialty: Interventional Pain Management Specialty designation: 09 Location: Outpatient facility Ref. Prov.: Dale Leighton, MD       Interventional Therapy   Type: Lumbar Facet, Medial Branch Block(s) (w/ fluoroscopic mapping)  #7   Laterality: Bilateral  Level: L3, L4, L5, and S1 Medial Branch Level(s). Injecting these levels blocks the L4-5 and L5-S1 lumbar facet joints. Imaging: Fluoroscopic guidance Spinal ([id number]) Anesthesia: Local anesthesia (1-2% Lidocaine) Anxiolysis: IV Versed         Sedation: No Sedation                       DOS: 11/01/2023 Performed by: Oswaldo Done, MD  Primary Purpose: Diagnostic/Therapeutic Indications: Low back pain severe enough to impact quality of life or function. 1. Chronic low back pain (1ry area of Pain) (Bilateral) (R>L) w/o sciatica   2. Low back pain of over 3 months duration   3. Lumbar facet joint pain   4. Lumbar facet hypertrophy (Multilevel) (Bilateral)   5. Lumbar facet syndrome (Bilateral) (R>L)   6. Spondylosis without myelopathy or radiculopathy, lumbosacral region    NAS-11 Pain score:   Pre-procedure: 8 /10   Post-procedure: 0-No pain/10     Position / Prep / Materials:  Position: Prone  Prep solution: ChloraPrep (2% chlorhexidine gluconate and 70% isopropyl alcohol) Area Prepped: Posterolateral  Lumbosacral Spine (Wide prep: From the lower border of the scapula down to the end of the tailbone and from flank to flank.)  Materials:  Tray: Block Needle(s):  Type: Spinal  Gauge (G): 22  Length: 5-in Qty: 4      Post-procedure evaluation   Procedure: Suprascapular nerve block (SSNB) #2  Laterality:  Right  Level: Superior to scapular spine, lateral to supraspinatus fossa (Suprascapular notch).  Imaging: Fluoroscopic guidance Non-spinal ([id number]) Anesthesia: Local anesthesia (1-2% Lidocaine) Anxiolysis: None                 Sedation:                         DOS: 10/16/2023  Performed by: Oswaldo Done, MD  Purpose: Diagnostic/Therapeutic Indications: Shoulder pain, severe enough to impact quality of life and/or function. 1. Chronic shoulder pain (Right)   2. Chronic upper back pain (Right)   3. Osteoarthritis of shoulder (Right)   4. Osteoarthritis of AC (acromioclavicular) joint (Right)   5. Unspecified injury of muscle(s) and tendon(s) of the rotator cuff of shoulder, sequela (Right)    NAS-11 score:   Pre-procedure: 8 /10   Post-procedure: 0-No pain/10      Effectiveness:  Initial hour after procedure: 100 %. Subsequent 4-6 hours post-procedure: 100 %. Analgesia past initial 6 hours: 75 % (lastin 4-5 days). Ongoing improvement:  Analgesic: The patient indicates having attained 100% relief of the pain for the duration of the local anesthetic followed by a decrease to a 75% improvement that lasted for approximately 4 to 5 days.  Based on these results the patient may be an adequate candidate  for a right sided suprascapular nerve radiofrequency ablation. Function: Transient improvement ROM: Transient improvement  H&P (Pre-op Assessment):  Wanda Martinez is a 88 y.o. (year old), female patient, seen today for interventional treatment. She  has a past surgical history that includes Tonsillectomy and adenoidectomy (1979); Vesicovaginal fistula closure w/ TAH (1983);  Breast surgery (1986); Breast enhancement surgery (1987); Breast implant removal; Breast implant removal (Right, 08/29/2012); Mastectomy (08/2012); Abdominal hysterectomy; Colonoscopy with propofol (N/A, 09/13/2016); Breast biopsy (2013); and Blepharoplasty. Wanda Martinez has a current medication list which includes the following prescription(s): amlodipine, aspirin ec, calcium carbonate, citalopram, citalopram, cyclobenzaprine, donepezil, gabapentin, glucosamine-chondroitin, hydrochlorothiazide, letrozole, losartan, lubiprostone, magnesium oxide, melatonin, metoprolol succinate, mirabegron er, multivitamin, naloxone, omega-3, oxycodone, pantoprazole, and rosuvastatin, and the following Facility-Administered Medications: lactated ringers and triamcinolone acetonide. Her primarily concern today is the Back Pain  Initial Vital Signs:  Pulse/HCG Rate: (!) 53ECG Heart Rate: (!) 52 Temp: (!) 97.3 F (36.3 C) Resp: 16 BP: (!) 148/86 SpO2: 98 %  BMI: Estimated body mass index is 27.81 kg/m as calculated from the following:   Height as of this encounter: 5\' 4"  (1.626 m).   Weight as of this encounter: 162 lb (73.5 kg).  Risk Assessment: Allergies: Reviewed. She is allergic to sulfa antibiotics and vesicare [solifenacin].  Allergy Precautions: None required Coagulopathies: Reviewed. None identified.  Blood-thinner therapy: None at this time Active Infection(s): Reviewed. None identified. Wanda Martinez is afebrile  Site Confirmation: Wanda Martinez was asked to confirm the procedure and laterality before marking the site Procedure checklist: Completed Consent: Before the procedure and under the influence of no sedative(s), amnesic(s), or anxiolytics, the patient was informed of the treatment options, risks and possible complications. To fulfill our ethical and legal obligations, as recommended by the American Medical Association's Code of Ethics, I have informed the patient of my clinical impression; the nature  and purpose of the treatment or procedure; the risks, benefits, and possible complications of the intervention; the alternatives, including doing nothing; the risk(s) and benefit(s) of the alternative treatment(s) or procedure(s); and the risk(s) and benefit(s) of doing nothing. The patient was provided information about the general risks and possible complications associated with the procedure. These may include, but are not limited to: failure to achieve desired goals, infection, bleeding, organ or nerve damage, allergic reactions, paralysis, and death. In addition, the patient was informed of those risks and complications associated to Spine-related procedures, such as failure to decrease pain; infection (i.e.: Meningitis, epidural or intraspinal abscess); bleeding (i.e.: epidural hematoma, subarachnoid hemorrhage, or any other type of intraspinal or peri-dural bleeding); organ or nerve damage (i.e.: Any type of peripheral nerve, nerve root, or spinal cord injury) with subsequent damage to sensory, motor, and/or autonomic systems, resulting in permanent pain, numbness, and/or weakness of one or several areas of the body; allergic reactions; (i.e.: anaphylactic reaction); and/or death. Furthermore, the patient was informed of those risks and complications associated with the medications. These include, but are not limited to: allergic reactions (i.e.: anaphylactic or anaphylactoid reaction(s)); adrenal axis suppression; blood sugar elevation that in diabetics may result in ketoacidosis or comma; water retention that in patients with history of congestive heart failure may result in shortness of breath, pulmonary edema, and decompensation with resultant heart failure; weight gain; swelling or edema; medication-induced neural toxicity; particulate matter embolism and blood vessel occlusion with resultant organ, and/or nervous system infarction; and/or aseptic necrosis of one or more joints. Finally, the patient  was informed that Medicine is not an exact science; therefore,  there is also the possibility of unforeseen or unpredictable risks and/or possible complications that may result in a catastrophic outcome. The patient indicated having understood very clearly. We have given the patient no guarantees and we have made no promises. Enough time was given to the patient to ask questions, all of which were answered to the patient's satisfaction. Wanda Martinez has indicated that she wanted to continue with the procedure. Attestation: I, the ordering provider, attest that I have discussed with the patient the benefits, risks, side-effects, alternatives, likelihood of achieving goals, and potential problems during recovery for the procedure that I have provided informed consent. Date  Time: 11/01/2023 10:11 AM   Pre-Procedure Preparation:  Monitoring: As per clinic protocol. Respiration, ETCO2, SpO2, BP, heart rate and rhythm monitor placed and checked for adequate function Safety Precautions: Patient was assessed for positional comfort and pressure points before starting the procedure. Time-out: I initiated and conducted the "Time-out" before starting the procedure, as per protocol. The patient was asked to participate by confirming the accuracy of the "Time Out" information. Verification of the correct person, site, and procedure were performed and confirmed by me, the nursing staff, and the patient. "Time-out" conducted as per Joint Commission's Universal Protocol (UP.01.01.01). Time:   Start Time:   hrs.  Description of Procedure:          Laterality: (see above) Targeted Levels: (see above)  Safety Precautions: Aspiration looking for blood return was conducted prior to all injections. At no point did we inject any substances, as a needle was being advanced. Before injecting, the patient was told to immediately notify me if she was experiencing any new onset of "ringing in the ears, or metallic taste in the  mouth". No attempts were made at seeking any paresthesias. Safe injection practices and needle disposal techniques used. Medications properly checked for expiration dates. SDV (single dose vial) medications used. After the completion of the procedure, all disposable equipment used was discarded in the proper designated medical waste containers. Local Anesthesia: Protocol guidelines were followed. The patient was positioned over the fluoroscopy table. The area was prepped in the usual manner. The time-out was completed. The target area was identified using fluoroscopy. A 12-in long, straight, sterile hemostat was used with fluoroscopic guidance to locate the targets for each level blocked. Once located, the skin was marked with an approved surgical skin marker. Once all sites were marked, the skin (epidermis, dermis, and hypodermis), as well as deeper tissues (fat, connective tissue and muscle) were infiltrated with a small amount of a short-acting local anesthetic, loaded on a 10cc syringe with a 25G, 1.5-in  Needle. An appropriate amount of time was allowed for local anesthetics to take effect before proceeding to the next step. Local Anesthetic: Lidocaine 2.0% The unused portion of the local anesthetic was discarded in the proper designated containers. Technical description of process:   L3 Medial Branch Nerve Block (MBB): The target area for the L3 medial branch is at the junction of the postero-lateral aspect of the superior articular process and the superior, posterior, and medial edge of the transverse process of L4. Under fluoroscopic guidance, a Quincke needle was inserted until contact was made with os over the superior postero-lateral aspect of the pedicular shadow (target area). After negative aspiration for blood, 0.5 mL of the nerve block solution was injected without difficulty or complication. The needle was removed intact. L4 Medial Branch Nerve Block (MBB): The target area for the L4 medial  branch is at the junction  of the postero-lateral aspect of the superior articular process and the superior, posterior, and medial edge of the transverse process of L5. Under fluoroscopic guidance, a Quincke needle was inserted until contact was made with os over the superior postero-lateral aspect of the pedicular shadow (target area). After negative aspiration for blood, 0.5 mL of the nerve block solution was injected without difficulty or complication. The needle was removed intact. L5 Medial Branch Nerve Block (MBB): The target area for the L5 medial branch is at the junction of the postero-lateral aspect of the superior articular process and the superior, posterior, and medial edge of the sacral ala. Under fluoroscopic guidance, a Quincke needle was inserted until contact was made with os over the superior postero-lateral aspect of the pedicular shadow (target area). After negative aspiration for blood, 0.5 mL of the nerve block solution was injected without difficulty or complication. The needle was removed intact. S1 Medial Branch Nerve Block (MBB): The target area for the S1 medial branch is at the posterior and inferior 6 o'clock position of the L5-S1 facet joint. Under fluoroscopic guidance, the Quincke needle inserted for the L5 MBB was redirected until contact was made with os over the inferior and postero aspect of the sacrum, at the 6 o' clock position under the L5-S1 facet joint (Target area). After negative aspiration for blood, 0.5 mL of the nerve block solution was injected without difficulty or complication. The needle was removed intact.  Once the entire procedure was completed, the treated area was cleaned, making sure to leave some of the prepping solution back to take advantage of its long term bactericidal properties.         Illustration of the posterior view of the lumbar spine and the posterior neural structures. Laminae of L2 through S1 are labeled. DPRL5, dorsal primary ramus of  L5; DPRS1, dorsal primary ramus of S1; DPR3, dorsal primary ramus of L3; FJ, facet (zygapophyseal) joint L3-L4; I, inferior articular process of L4; LB1, lateral branch of dorsal primary ramus of L1; IAB, inferior articular branches from L3 medial branch (supplies L4-L5 facet joint); IBP, intermediate branch plexus; MB3, medial branch of dorsal primary ramus of L3; NR3, third lumbar nerve root; S, superior articular process of L5; SAB, superior articular branches from L4 (supplies L4-5 facet joint also); TP3, transverse process of L3.   Facet Joint Innervation (* possible contribution)  L1-2 T12, L1 (L2*)  Medial Branch  L2-3 L1, L2 (L3*)         "          "  L3-4 L2, L3 (L4*)         "          "  L4-5 L3, L4 (L5*)         "          "  L5-S1 L4, L5, S1          "          "    Vitals:   11/01/23 1136 11/01/23 1141 11/01/23 1144 11/01/23 1150  BP: (!) 184/78 (!) 172/71 (!) 184/76 (!) 180/77  Pulse:      Resp: 18 17 18 14   Temp:      SpO2: 95% 95% 95% 95%  Weight:      Height:         End Time:   hrs.  Imaging Guidance (Spinal):          Type of Imaging Technique: Fluoroscopy Guidance (  Spinal) Indication(s): Fluoroscopy guidance for needle placement to enhance accuracy in procedures requiring precise needle localization for targeted delivery of medication in or near specific anatomical locations not easily accessible without such real-time imaging assistance. Exposure Time: Please see nurses notes. Contrast: None used. Fluoroscopic Guidance: I was personally present during the use of fluoroscopy. "Tunnel Vision Technique" used to obtain the best possible view of the target area. Parallax error corrected before commencing the procedure. "Direction-depth-direction" technique used to introduce the needle under continuous pulsed fluoroscopy. Once target was reached, antero-posterior, oblique, and lateral fluoroscopic projection used confirm needle placement in all planes. Images permanently  stored in EMR. Interpretation: No contrast injected. I personally interpreted the imaging intraoperatively. Adequate needle placement confirmed in multiple planes. Permanent images saved into the patient's record.  Post-operative Assessment:  Post-procedure Vital Signs:  Pulse/HCG Rate: (!) 53(!) 51 Temp: (!) 97.3 F (36.3 C) Resp: 14 BP: (!) 180/77 SpO2: 95 %  EBL: None  Complications: No immediate post-treatment complications observed by team, or reported by patient.  Note: The patient tolerated the entire procedure well. A repeat set of vitals were taken after the procedure and the patient was kept under observation following institutional policy, for this type of procedure. Post-procedural neurological assessment was performed, showing return to baseline, prior to discharge. The patient was provided with post-procedure discharge instructions, including a section on how to identify potential problems. Should any problems arise concerning this procedure, the patient was given instructions to immediately contact us, at any time, without hesitation. In any case, we plan to contact the patient by telephone for a follow-up status report regarding this interventional procedure.  Comments:  No additional relevant information.  Plan of Care (POC)  Orders:  Orders Placed This Encounter  Procedures   LUMBAR FACET(MEDIAL BRANCH NERVE BLOCK) MBNB    Scheduling Instructions:     Procedure: Lumbar facet block (AKA.: Lumbosacral medial branch nerve block)     Side: Bilateral     Level: L3-4, L4-5, L5-S1, and TBD Facets (L2, L3, L4, L5, S1, and TBD Medial Branch Nerves)     Sedation: Patient's choice.     Timeframe: Today    Order Specific Question:   Where will this procedure be performed?    Answer:   [facility]   DG PAIN [facility] C-ARM 1-60 MIN NO REPORT    Intraoperative interpretation by procedural physician at [facility].    Standing Status:   Standing    Number of  Occurrences:   1    Order Specific Question:   Reason for exam:    Answer:   Assistance in needle guidance and placement for procedures requiring needle placement in or near specific anatomical locations not easily accessible without such assistance.   Informed Consent Details: Physician/Practitioner Attestation; Transcribe to consent form and obtain patient signature    Nursing Order: Transcribe to consent form and obtain patient signature. Note: Always confirm laterality of pain with Wanda Martinez, before procedure.    Order Specific Question:   Physician/Practitioner attestation of informed consent for procedure/surgical case    Answer:   I, the physician/practitioner, attest that I have discussed with the patient the benefits, risks, side effects, alternatives, likelihood of achieving goals and potential problems during recovery for the procedure that I have provided informed consent.    Order Specific Question:   Procedure    Answer:   Lumbar Facet Block  under fluoroscopic guidance    Order Specific Question:   Physician/Practitioner performing  the procedure    Answer:   Reily Ilic A. Laban Emperor MD    Order Specific Question:   Indication/Reason    Answer:   Low Back Pain, with our without leg pain, due to Facet Joint Arthralgia (Joint Pain) Spondylosis (Arthritis of the Spine), without myelopathy or radiculopathy (Nerve Damage).   Provide equipment / supplies at bedside    Procedure tray: "Block Tray" (Disposable  single use) Skin infiltration needle: Regular 1.5-in, 25-G, (x1) Block Needle type: Spinal Amount/quantity: 4 Size: Medium (5-inch) Gauge: 22G    Standing Status:   Standing    Number of Occurrences:   1    Order Specific Question:   Specify    Answer:   Block Tray   Chronic Opioid Analgesic:  Oxycodone IR 5 mg, 1 tab PO q 8 hrs (15 mg/day of oxycodone) MME/day: 22.5 mg/day.   Medications ordered for procedure: Meds ordered this encounter  Medications   lidocaine  (XYLOCAINE) 2 % (with pres) injection 400 mg   pentafluoroprop-tetrafluoroeth (GEBAUERS) aerosol   lactated ringers infusion   ropivacaine (PF) 2 mg/mL (0.2%) (NAROPIN) injection 18 mL   triamcinolone acetonide (KENALOG-40) injection 80 mg   midazolam (VERSED) injection 0.5-2 mg    Make sure Flumazenil is available in the pyxis when using this medication. If oversedation occurs, administer 0.2 mg IV over 15 sec. If after 45 sec no response, administer 0.2 mg again over 1 min; may repeat at 1 min intervals; not to exceed 4 doses (1 mg)   Medications administered: We administered lidocaine, pentafluoroprop-tetrafluoroeth, lactated ringers, ropivacaine (PF) 2 mg/mL (0.2%), and midazolam.  See the medical record for exact dosing, route, and time of administration.  Follow-up plan:   Return in about 2 weeks (around 11/15/2023) for (Face2F), (PPE).       Interventional Therapies  Risk Factors  Considerations  Medical Comorbidities:  Parkinson's disease  Hx right breast cancer  OIC  SOB  CKD Stage 3b  GERD  depression  osteopenia/osteoporosis  PSVT  Memory impairment     Planned  Pending:   Therapeutic bilateral lumbar facet block #7 (11/01/2023)    Under consideration:  Therapeutic right sided suprascapular nerve RFA #1  Therapeutic bilateral lumbar facet block    Completed:   Therapeutic right trapezius TPI x4 (07/17/2023) (100/100/0/0)  Therapeutic right thoracic TPI x2 (10/17/2018) (DKFUA)  Therapeutic right QL & ESM TPI x1 (02/12/2018)   Diagnostic right SI joint Blk x3 (12/21/2020)  (DKFUA)  Therapeutic right SI joint RFA x4 (03/01/2023) (100/100/70/70)   Therapeutic right lumbar facet MBB (FCT: L3-4,L4-5,L5-S1) x5 (08/09/2021) (100/100/50)  Therapeutic right lumbar facet MBB (FCT: L5-S1) x1 (08/29/2022) (100/100/100/0)  Therapeutic right lumbar facet RFA x4 (03/01/2023) (100/100/70/70)   Therapeutic bilateral L5 and L2 TFESI x1 (07/03/2023) (100/90/50/L:100R:20))   Therapeutic right L3 and L4 TFESI x1 (07/27/2022) (100/100/75/50)  Therapeutic right L5 TFESI x4 (02/14/2022) (100/100/70/75)  Therapeutic right S1 TFESI x1 (11/04/2020) (100/100/20/<50)   Therapeutic right L3-4 LESI x2 (11/04/2020) (100/100/20/<50)  Therapeutic right L4-5 LESI x2 (03/30/2022) (100/100/75/75)  Therapeutic right L5-S1 LESI x2 (12/04/2019) (100/100/50/75)  Therapeutic midline L2-3 LESI x1 (10/04/2017) (100/100/85/>50)   Diagnostic bilateral superficial trochanteric bursa injec. x1 (04/30/2018) (DKFUA)  Diagnostic right IA hip injection x1 (02/13/2019) (DKFUA)   Diagnostic/therapeutic right IA steroid knee inj. x4 (01/10/2023) (100/100/100/80)  Therapeutic right (Zilretta) knee injection x2 (07/31/2023)  Therapeutic right (Monovisc) knee injection x1 (06/07/2021) (100/100/100/75)   Therapeutic left thoracic TPI x1 (10/17/2018) (DKFUA)  Diagnostic left SI joint Blk x2 (04/12/2023) (100/90/20/<25)  Therapeutic left lumbar facet MBB (L2-S1) (FCT: L3-4,L4-5,L5-S1) x5 (08/09/2021) (100/100/50)  Therapeutic left lumbar facet MBB (L4-S1) (FCT: L5-S1) x1 (08/29/2022) (100/100/100/100)  Therapeutic left lumbar facet RFA x2 (11/30/2022) (100/60/90/>75)    Therapeutic  Palliative (PRN) options:   Do not schedule any procedures without a proper preprocedure evaluation.   Completed by other providers:   Therapeutic right knee (ORTHOVISC) inj. x3 (05/18/2023, 05/23/2023, 05/30/2023) by Aram Candela, MD [facility]R) Therapeutic left knee (Kenalog) inj. x1 (08/25/2020) by Novella Olive, MD [facility] PMR)   Pharmacotherapy  Nonopioids transfer 12/21/2020: Gabapentin        Recent Visits Date Type Provider Dept  10/16/23 Procedure visit [name], MD [facility]  09/19/23 Office Visit [name], MD [facility]  Showing recent visits within past 90 days and meeting all other requirements Today's Visits Date Type Provider Dept  11/01/23  Procedure visit [name], MD [facility]  Showing today's visits and meeting all other requirements Future Appointments Date Type Provider Dept  11/12/23 Appointment [name], MD [facility]  11/20/23 Appointment [name], MD [facility]  Showing future appointments within next 90 days and meeting all other requirements  Disposition: Discharge home  Discharge (Date  Time): 11/01/2023; 1200 hrs.   Primary Care Physician: Dale Woodson, MD Location: [facility] Note by: Oswaldo Done, MD (TTS technology used. I apologize for any typographical errors that were not detected and corrected.) Date: 11/01/2023; Time: 12:26 PM  Disclaimer:  Medicine is not an Visual merchandiser. The only guarantee in medicine is that nothing is guaranteed. It is important to note that the decision to proceed with this intervention was based on the information collected from the patient. The Data and conclusions were drawn from the patient's questionnaire, the interview, and the physical examination. Because the information was provided in large part by the patient, it cannot be guaranteed that it has not been purposely or unconsciously manipulated. Every effort has been made to obtain as much relevant data as possible for this evaluation. It is important to note that the conclusions that lead to this procedure are derived in large part from the available data. Always take into account that the treatment will also be dependent on availability of resources and existing treatment guidelines, considered by other Pain Management Practitioners as being common knowledge and practice, at the time of the intervention. For Medico-Legal purposes, it is also important to point out that variation in procedural techniques and pharmacological choices are the acceptable norm. The indications, contraindications, technique, and results of the above  procedure should only be interpreted and judged by a Board-Certified Interventional Pain Specialist with extensive familiarity and expertise in the same exact procedure and technique.

## 2023-11-02 ENCOUNTER — Telehealth: Payer: Self-pay | Admitting: *Deleted

## 2023-11-02 NOTE — Telephone Encounter (Signed)
No problems post procedure. 

## 2023-11-06 ENCOUNTER — Other Ambulatory Visit: Payer: Self-pay | Admitting: *Deleted

## 2023-11-06 DIAGNOSIS — D649 Anemia, unspecified: Secondary | ICD-10-CM

## 2023-11-07 ENCOUNTER — Encounter: Payer: Medicare HMO | Admitting: Pain Medicine

## 2023-11-07 ENCOUNTER — Other Ambulatory Visit: Payer: Self-pay | Admitting: Oncology

## 2023-11-07 ENCOUNTER — Inpatient Hospital Stay: Payer: Medicare HMO

## 2023-11-07 ENCOUNTER — Inpatient Hospital Stay: Payer: Medicare HMO | Attending: Oncology

## 2023-11-07 DIAGNOSIS — D631 Anemia in chronic kidney disease: Secondary | ICD-10-CM | POA: Insufficient documentation

## 2023-11-07 DIAGNOSIS — N186 End stage renal disease: Secondary | ICD-10-CM | POA: Diagnosis not present

## 2023-11-07 DIAGNOSIS — M810 Age-related osteoporosis without current pathological fracture: Secondary | ICD-10-CM | POA: Diagnosis not present

## 2023-11-07 DIAGNOSIS — D649 Anemia, unspecified: Secondary | ICD-10-CM

## 2023-11-07 LAB — CBC WITH DIFFERENTIAL/PLATELET
Abs Immature Granulocytes: 0.03 10*3/uL (ref 0.00–0.07)
Basophils Absolute: 0 10*3/uL (ref 0.0–0.1)
Basophils Relative: 0 %
Eosinophils Absolute: 0 10*3/uL (ref 0.0–0.5)
Eosinophils Relative: 0 %
HCT: 34.9 % — ABNORMAL LOW (ref 36.0–46.0)
Hemoglobin: 11.2 g/dL — ABNORMAL LOW (ref 12.0–15.0)
Immature Granulocytes: 1 %
Lymphocytes Relative: 23 %
Lymphs Abs: 1.3 10*3/uL (ref 0.7–4.0)
MCH: 29.2 pg (ref 26.0–34.0)
MCHC: 32.1 g/dL (ref 30.0–36.0)
MCV: 90.9 fL (ref 80.0–100.0)
Monocytes Absolute: 0.5 10*3/uL (ref 0.1–1.0)
Monocytes Relative: 9 %
Neutro Abs: 3.7 10*3/uL (ref 1.7–7.7)
Neutrophils Relative %: 67 %
Platelets: 187 10*3/uL (ref 150–400)
RBC: 3.84 MIL/uL — ABNORMAL LOW (ref 3.87–5.11)
RDW: 14.1 % (ref 11.5–15.5)
WBC: 5.5 10*3/uL (ref 4.0–10.5)
nRBC: 0 % (ref 0.0–0.2)

## 2023-11-07 NOTE — Progress Notes (Signed)
Patient hgb 11.2, no retacrit today. Patient aware

## 2023-11-07 NOTE — Addendum Note (Signed)
Addended by: Amaryllis Dyke on: 11/07/2023 12:45 PM   Modules accepted: Orders

## 2023-11-12 ENCOUNTER — Encounter: Payer: Self-pay | Admitting: Pain Medicine

## 2023-11-12 ENCOUNTER — Ambulatory Visit: Payer: Medicare HMO | Attending: Pain Medicine | Admitting: Pain Medicine

## 2023-11-12 DIAGNOSIS — M47816 Spondylosis without myelopathy or radiculopathy, lumbar region: Secondary | ICD-10-CM | POA: Diagnosis not present

## 2023-11-12 DIAGNOSIS — G894 Chronic pain syndrome: Secondary | ICD-10-CM

## 2023-11-12 DIAGNOSIS — M1711 Unilateral primary osteoarthritis, right knee: Secondary | ICD-10-CM

## 2023-11-12 DIAGNOSIS — M549 Dorsalgia, unspecified: Secondary | ICD-10-CM | POA: Insufficient documentation

## 2023-11-12 DIAGNOSIS — M25561 Pain in right knee: Secondary | ICD-10-CM | POA: Diagnosis not present

## 2023-11-12 DIAGNOSIS — Z79891 Long term (current) use of opiate analgesic: Secondary | ICD-10-CM | POA: Diagnosis not present

## 2023-11-12 DIAGNOSIS — S46001S Unspecified injury of muscle(s) and tendon(s) of the rotator cuff of right shoulder, sequela: Secondary | ICD-10-CM

## 2023-11-12 DIAGNOSIS — G8929 Other chronic pain: Secondary | ICD-10-CM | POA: Diagnosis not present

## 2023-11-12 DIAGNOSIS — M25511 Pain in right shoulder: Secondary | ICD-10-CM | POA: Insufficient documentation

## 2023-11-12 DIAGNOSIS — Z79899 Other long term (current) drug therapy: Secondary | ICD-10-CM | POA: Diagnosis not present

## 2023-11-12 MED ORDER — OXYCODONE HCL 5 MG PO TABS: 5.0000 mg | ORAL_TABLET | Freq: Four times a day (QID) | ORAL | 0 refills | Status: DC | PRN

## 2023-11-12 MED ORDER — NALOXONE HCL 4 MG/0.1ML NA LIQD: 1.0000 | NASAL | 0 refills | Status: AC | PRN

## 2023-11-12 NOTE — Progress Notes (Signed)
PROVIDER NOTE: Information contained herein reflects review and annotations entered in association with encounter. Interpretation of such information and data should be left to medically-trained personnel. Information provided to patient can be located elsewhere in the medical record under "Patient Instructions". Document created using STT-dictation technology, any transcriptional errors that may result from process are unintentional.    Patient: Wanda Martinez  Service Category: E/M  Provider: Oswaldo Done, MD  DOB: [birth year]  DOS: 11/12/2023  Referring Provider: Dale [place]course, MD  MRN: [id number]  Specialty: Interventional Pain Management  PCP: Dale [place], MD  Type: Established Patient  Setting: Ambulatory outpatient    Location: Office  Delivery: Face-to-face     HPI  [name], a 88 y.o. year old female, is here today because of her No primary diagnosis found.. [name] primary complain today is Knee Pain (right) and Back Pain  Pertinent problems: [name] has Headache; Parkinson's disease (HCC); Chronic low back pain (1ry area of Pain) (Bilateral) (R>L) w/o sciatica; Lumbar spondylosis; Chronic hip pain (Bilateral); Chronic neck pain; Cervical spondylosis; Chronic cervical radicular pain (Right); Diffuse myofascial pain syndrome; Neurogenic pain; Chronic upper back pain (Right); Myofascial pain syndrome (Right) (cervicothoracic); Lumbar facet syndrome (Bilateral) (R>L); History of breast cancer; Cervical facet hypertrophy; Cervical facet syndrome (HCC); Chronic shoulder pain (Right); Chronic pain syndrome; Chronic sacroiliac joint pain (Left); Chronic sacroiliac joint pain (Right); 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, L4-5, L5-S1) (Right); Lumbar spinal stenosis (with neurogenic claudication) (L3-4); Chronic lower extremity pain (Right); Chronic lumbar radicular pain (Right); Trochanteric bursitis of hip (Bilateral); Spondylosis without myelopathy or  radiculopathy, lumbosacral region; Trigger point with back pain (Right); DDD (degenerative disc disease), lumbosacral; Chronic upper extremity pain (Right); Chronic thoracic back pain (Bilateral) (L>R); Trigger point of thoracic region (Bilateral) (L>R); Other specified dorsopathies, sacral and sacrococcygeal region; Sacroiliac joint dysfunction (Right); Osteoarthritis of sacroiliac joint (Right); Somatic dysfunction of sacroiliac joint (Right); Chronic musculoskeletal pain; Facial pain; Chronic hip pain (Right); Osteoarthritis of hip (Right); Left ear pain; 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HCC); Abnormal MRI, lumbar spine (08/23/2022); Osteoarthritis involving multiple joints; Other spondylosis, sacral and sacrococcygeal region; Lumbar facet hypertrophy (Multilevel) (Bilateral); Chronic knee pain (Right); Osteoarthritis of knee (Right); Trigger point of shoulder region (Right); Osteoarthritis of AC (acromioclavicular) joint (Right); Osteoarthritis of shoulder (Right); Unspecified injury of muscle(s) and tendon(s) of the rotator cuff of shoulder, sequela (Right); Cervicalgia; DDD (degenerative disc disease), lumbar; Lumbar radicular pain (Bilateral); Lumbar foraminal stenosis (Right: L3-4, L4-5, L5-S1) (Left: L2-3); Abnormal MRI, hip (Right) (05/04/2022); Abnormal MRI, knee (01/04/2022); Tricompartment osteoarthritis of knee (Right); Chronic low back pain (Right) w/o sciatica; Hoffa's fat pad disease (HCC) (Right); Hoffa's pad syndrome (HCC) (Right); Chronic low back pain (Right) w/ sciatica (Right); Lower extremity weakness (Right); Chronic radicular pain of lower back; Chronic sciatica (Right); Lumbosacral radiculopathy at L5 (Right); Low back pain of over 3 months duration; Abscess and cellulitis of gluteal region; Cellulitis of buttock; Cutaneous abscess of buttock; Personal history of breast cancer; Pes anserine bursitis (Right); Infrapatellar bursitis of knee (Right); Traumatic ecchymosis of right lower leg; and  Lumbar facet joint pain on their pertinent problem list. Pain Assessment: Severity of Chronic pain is reported as a 6 /10. Location: Knee Right/denies. Onset: More than a month ago. Quality: Stabbing. Timing: Constant. Modifying factor(s): nothing. Vitals:  height is 5\' 3"  (1.6 m) and weight is 159 lb (72.1 kg). Her temperature is 97 F (36.1 C) (abnormal). Her blood pressure is 137/65 and her pulse is 60. Her respiration is 18  and oxygen saturation is 100%.  BMI: Estimated body mass index is 28.17 kg/m as calculated from the following:   Height as of this encounter: 5\' 3"  (1.6 m).   Weight as of this encounter: 159 lb (72.1 kg). Last encounter: 09/19/2023. Last procedure: 11/01/2023.  Reason for encounter: medication management.  The patient comes into the clinic today after having had a bilateral lumbar facet block on 11/01/2023.  On 10/16/2023 she had a right suprascapular nerve block and on 07/31/2023 she had a right sided Zilretta knee injection.  It has been 3 months since the Zilretta knee injection and today she is asking to have another knee injection.  According to the pharmaceutical company that makes the medication, they indicated that 3 months is the usual duration of benefit from the extended release triamcinolone (Zilretta). Discussed the use of AI scribe software for clinical note transcription with the patient, who gave verbal consent to proceed.  History of Present Illness   The patient, with a history of chronic lumbar facet joint pain and right knee pain, reports significant improvement following bilateral lumbar facet block at the L4-5 and L5-S1 levels. The procedure was successful in reducing the pain from an 8/10 to a 0/10 initially. Post-procedure, the patient experienced numbness for the duration of the numbing medicine, after which the pain gradually returned. However, the patient reports an 85% improvement in pain on the left side and a 70% improvement on the right side. There  is no reported leg pain associated with the lumbar facet joint pain.  In addition to the lumbar facet joint pain, the patient also reports right knee pain. The last Xelretta knee injection was administered three months ago, and the patient is due for another injection. The patient denies any adverse reactions or side effects to their current medications.     RTCB:  02/12/2024   Post-procedure evaluation   Type: Lumbar Facet, Medial Branch Block(s) (w/ fluoroscopic mapping)  #7   Laterality: Bilateral  Level: L3, L4, L5, and S1 Medial Branch Level(s). Injecting these levels blocks the L4-5 and L5-S1 lumbar facet joints. Imaging: Fluoroscopic guidance Spinal ([id number]) Anesthesia: Local anesthesia (1-2% Lidocaine) Anxiolysis: IV Versed         Sedation: No Sedation                       DOS: 11/01/2023 Performed by: Oswaldo Done, MD  Primary Purpose: Diagnostic/Therapeutic Indications: Low back pain severe enough to impact quality of life or function. 1. Chronic low back pain (1ry area of Pain) (Bilateral) (R>L) w/o sciatica   2. Low back pain of over 3 months duration   3. Lumbar facet joint pain   4. Lumbar facet hypertrophy (Multilevel) (Bilateral)   5. Lumbar facet syndrome (Bilateral) (R>L)   6. Spondylosis without myelopathy or radiculopathy, lumbosacral region    NAS-11 Pain score:   Pre-procedure: 8 /10   Post-procedure: 0-No pain/10      Effectiveness:  Initial hour after procedure: 100 %. Subsequent 4-6 hours post-procedure: 100 %. Analgesia past initial 6 hours:  (left side 85%  right side 70%). Ongoing improvement:  Analgesic: The patient indicates having a left-sided ongoing 85% improvement of the lower back pain and on the right side of the lower back she refers having an ongoing 70% improvement. Function: [name] reports improvement in function ROM: [name] reports improvement in ROM  Pharmacotherapy Assessment  Analgesic: Oxycodone IR 5 mg, 1 tab PO  q 8 hrs (  15 mg/day of oxycodone) MME/day: 22.5 mg/day.   Monitoring: [place] PMP: PDMP reviewed during this encounter.       Pharmacotherapy: No side-effects or adverse reactions reported. Compliance: No problems identified. Effectiveness: Clinically acceptable.  Valerie Salts, RN  11/12/2023  1:50 PM  Sign when Signing Visit Nursing Pain Medication Assessment:  Safety precautions to be maintained throughout the outpatient stay will include: orient to surroundings, keep bed in low position, maintain call bell within reach at all times, provide assistance with transfer out of bed and ambulation.  Medication Inspection Compliance: Pill count conducted under aseptic conditions, in front of the patient. Neither the pills nor the bottle was removed from the patient's sight at any time. Once count was completed pills were immediately returned to the patient in their original bottle.  Medication: Oxycodone IR Pill/Patch Count:  33 of 120 pills remain Pill/Patch Appearance: Markings consistent with prescribed medication Bottle Appearance: Standard pharmacy container. Clearly labeled. Filled Date: 68 / 28 / 2024 Last Medication intake:  Today    No results found for: "CBDTHCR" No results found for: "D8THCCBX" No results found for: "D9THCCBX"  UDS:  Summary  Date Value Ref Range Status  07/31/2023 Note  Final    Comment:    ==================================================================== [employer] Select 13 (MW) ==================================================================== Test                             Result       Flag       Units  Drug Present and Declared for Prescription Verification   Oxycodone                      839          EXPECTED   ng/mg creat   Noroxycodone                   3412         EXPECTED   ng/mg creat    Sources of oxycodone include scheduled prescription medications.    Noroxycodone is an expected metabolite of oxycodone.  Drug Present not Declared for  Prescription Verification   Hydrocodone                    340          UNEXPECTED ng/mg creat   Dihydrocodeine                 32           UNEXPECTED ng/mg creat   Norhydrocodone                 403          UNEXPECTED ng/mg creat    Sources of hydrocodone include scheduled prescription medications.    Dihydrocodeine and norhydrocodone are expected metabolites of    hydrocodone. Dihydrocodeine is also available as a scheduled    prescription medication.  ==================================================================== Test                      Result    Flag   Units      Ref Range   Creatinine              208              mg/dL      >=44 ==================================================================== Declared Medications:  The flagging and interpretation on this report  are based on the  following declared medications.  Unexpected results may arise from  inaccuracies in the declared medications.   **Note: The testing scope of this panel includes these medications:   Oxycodone (Roxicodone)   **Note: The testing scope of this panel does not include the  following reported medications:   Amlodipine (Norvasc)  Aspirin  Calcium  Chondroitin  Ciprofloxacin (Cipro)  Citalopram (Celexa)  Cyclobenzaprine (Flexeril)  Donepezil (Aricept)  Erythromycin  Gabapentin (Neurontin)  Glucosamine  Hydrocortisone (Anusol-HC)  Letrozole (Femara)  Losartan (Cozaar)  Lubiprostone (Amitiza)  Magnesium (Mag-Ox)  Melatonin  Metoprolol (Toprol)  Mirabegron (Myrbetriq)  Multivitamin  Naloxone (Narcan)  Nitrofurantoin (Macrobid)  Omega-3 Fatty Acids  Pantoprazole (Protonix)  Rosuvastatin (Crestor) ==================================================================== For clinical consultation, please call [phone]. ====================================================================       ROS  Constitutional: Denies any fever or chills Gastrointestinal: No reported  hemesis, hematochezia, vomiting, or acute GI distress Musculoskeletal: Denies any acute onset joint swelling, redness, loss of ROM, or weakness Neurological: No reported episodes of acute onset apraxia, aphasia, dysarthria, agnosia, amnesia, paralysis, loss of coordination, or loss of consciousness  Medication Review  Glucosamine-Chondroitin, Omega-3, amLODipine, aspirin EC, calcium carbonate, citalopram, cyclobenzaprine, donepezil, gabapentin, hydrochlorothiazide, letrozole, losartan, lubiprostone, magnesium oxide, melatonin, metoprolol succinate, mirabegron ER, multivitamin, naloxone, oxyCODONE, pantoprazole, and rosuvastatin  History Review  Allergy: [name] is allergic to sulfa antibiotics and vesicare [solifenacin]. Drug: [name]  reports no history of drug use. Alcohol:  reports no history of alcohol use. Tobacco:  reports that she has never smoked. She has never been exposed to tobacco smoke. She has never used smokeless tobacco. Social: [name]  reports that she has never smoked. She has never been exposed to tobacco smoke. She has never used smokeless tobacco. She reports that she does not drink alcohol and does not use drugs. Medical:  has a past medical history of Acute postoperative pain (10/25/2017), Anemia, Arm pain (07/26/2015), Arthritis, Arthritis, degenerative (03/26/2014), Back pain (11/01/2013), Bladder infection (06/2018), Breast cancer (HCC), Breast cancer (HCC), CHEST PAIN (04/29/2010), Chronic cystitis, Cystocele (02/22/2013), Cystocele, midline (08/19/2013), Degeneration of intervertebral disc of lumbosacral region (03/26/2014), DYSPNEA (04/29/2010), Enthesopathy of hip (03/26/2014), GERD (gastroesophageal reflux disease), Hiatal hernia, HTN (hypertension), Hypokalemia (06/2018), Hyponatremia (06/2018), LBP (low back pain) (03/26/2014), Neck pain (11/01/2013), Parkinson disease (HCC), Pneumonia (06/2018), Sinusitis (02/07/2015), Skin lesions (07/12/2014), and Urinary  incontinence. Surgical: [name]  has a past surgical history that includes Tonsillectomy and adenoidectomy (1979); Vesicovaginal fistula closure w/ TAH (1983); Breast surgery (1986); Breast enhancement surgery (1987); Breast implant removal; Breast implant removal (Right, 08/29/2012); Mastectomy (08/2012); Abdominal hysterectomy; Colonoscopy with propofol (N/A, 09/13/2016); Breast biopsy (2013); and Blepharoplasty. Family: family history includes Colon polyps in her father; Diabetes in her father; Parkinson's disease in her mother; Stroke in her father and mother.  Laboratory Chemistry Profile   Renal Lab Results  Component Value Date   BUN 33 (H) 09/26/2023   CREATININE 1.14 (H) 09/26/2023   BCR 20 06/11/2020   GFR 51.23 (L) 09/11/2023   GFRAA >60 07/27/2020   GFRNONAA 47 (L) 09/26/2023    Hepatic Lab Results  Component Value Date   AST 25 09/11/2023   ALT 19 09/11/2023   ALBUMIN 3.8 09/11/2023   ALKPHOS 32 (L) 09/11/2023    Electrolytes Lab Results  Component Value Date   NA 132 (L) 09/26/2023   K 4.2 09/26/2023   CL 101 09/26/2023   CALCIUM 9.4 09/26/2023   MG 2.2 07/17/2018    Bone Lab Results  Component  Value Date   VD25OH 66.95 09/26/2022   25OHVITD1 42 07/17/2018   25OHVITD2 <1.0 07/17/2018   25OHVITD3 42 07/17/2018    Inflammation (CRP: Acute Phase) (ESR: Chronic Phase) Lab Results  Component Value Date   CRP 3 07/17/2018   ESRSEDRATE 27 07/17/2018         Note: Above Lab results reviewed.  Recent Imaging Review  DG PAIN CLINIC C-ARM 1-60 MIN NO REPORT Fluoro was used, but no Radiologist interpretation will be provided.  Please refer to "NOTES" tab for provider progress note. Note: Reviewed        Physical Exam  General appearance: Well nourished, well developed, and well hydrated. In no apparent acute distress Mental status: Alert, oriented x 3 (person, place, & time)       Respiratory: No evidence of acute respiratory distress Eyes:  PERLA Vitals: BP 137/65   Pulse 60   Temp (!) 97 F (36.1 C)   Resp 18   Ht 5\' 3"  (1.6 m)   Wt 159 lb (72.1 kg)   LMP 12/18/1981   SpO2 100%   BMI 28.17 kg/m  BMI: Estimated body mass index is 28.17 kg/m as calculated from the following:   Height as of this encounter: 5\' 3"  (1.6 m).   Weight as of this encounter: 159 lb (72.1 kg). Ideal: Ideal body weight: 52.4 kg (115 lb 8.3 oz) Adjusted ideal body weight: 60.3 kg (132 lb 14.6 oz)  Assessment   Diagnosis Status  1. Chronic pain of right knee   2. Primary osteoarthritis of right knee   3. Tricompartment osteoarthritis of right knee   4. Chronic upper back pain   5. Chronic right shoulder pain   6. Unspecified injury of muscle(s) and tendon(s) of the rotator cuff of right shoulder, sequela   7. Facet syndrome, lumbar   8. Chronic pain syndrome   9. Pharmacologic therapy   10. Chronic use of opiate for therapeutic purpose   11. Encounter for medication management   12. Encounter for chronic pain management    Controlled Controlled Controlled   Updated Problems: No problems updated.  Plan of Care  Problem-specific:  Assessment and Plan    Lumbar Facet Joint Pain They report an 80% improvement on the left side and 70-75% improvement on the right side following a bilateral lumbar facet block at L4-5 and L5-S1. Initial pain decreased from 8/10 to 0/10 post-procedure, with residual benefits noted and no leg pain. Significant pain relief has been achieved; we will consider repeat lumbar facet block if pain recurs.  Right Knee Pain They report right knee pain, previously treated with a Xelretta injection on July 31, 2023, which provided relief for approximately three months, consistent with expected duration. We discussed scheduling another injection before Thanksgiving, with potential availability tomorrow due to cancellations. We will schedule the right knee Xelretta injection as soon as possible, potentially  tomorrow.  Medication Management They report no side effects or adverse reactions to current medications. We will send medication refills to the pharmacy for a 90-day supply.       [name] has a current medication list which includes the following long-term medication(s): amlodipine, calcium carbonate, citalopram, citalopram, cyclobenzaprine, donepezil, gabapentin, losartan, magnesium oxide, metoprolol succinate, mirabegron er, pantoprazole, rosuvastatin, [START ON 11/14/2023] oxycodone, [START ON 12/14/2023] oxycodone, and [START ON 01/13/2024] oxycodone.  Pharmacotherapy (Medications Ordered): Meds ordered this encounter  Medications   oxyCODONE (OXY IR/ROXICODONE) 5 MG immediate release tablet    Sig: Take 1  tablet (5 mg total) by mouth every 6 (six) hours as needed for severe pain (pain score 7-10) or breakthrough pain (PRN for post-radiofrequency pain). Must last 30 days.    Dispense:  120 tablet    Refill:  0    DO NOT: delete (not duplicate); no partial-fill (will deny script to complete), no refill request (F/U required). DISPENSE: 1 day early if closed on fill date. WARN: No CNS-depressants within 8 hrs of med.   naloxone (NARCAN) nasal spray 4 mg/0.1 mL    Sig: Place 1 spray into the nose as needed for up to 365 doses (for opioid-induced respiratory depresssion). In case of emergency (overdose), spray once into each nostril. If no response within 3 minutes, repeat application and call 911.    Dispense:  1 each    Refill:  0    Instruct patient in proper use of device.   oxyCODONE (OXY IR/ROXICODONE) 5 MG immediate release tablet    Sig: Take 1 tablet (5 mg total) by mouth every 6 (six) hours as needed for severe pain (pain score 7-10) or breakthrough pain (PRN for post-radiofrequency pain). Must last 30 days.    Dispense:  120 tablet    Refill:  0    DO NOT: delete (not duplicate); no partial-fill (will deny script to complete), no refill request (F/U required).  DISPENSE: 1 day early if closed on fill date. WARN: No CNS-depressants within 8 hrs of med.   oxyCODONE (OXY IR/ROXICODONE) 5 MG immediate release tablet    Sig: Take 1 tablet (5 mg total) by mouth every 6 (six) hours as needed for severe pain (pain score 7-10) or breakthrough pain (PRN for post-radiofrequency pain). Must last 30 days.    Dispense:  120 tablet    Refill:  0    DO NOT: delete (not duplicate); no partial-fill (will deny script to complete), no refill request (F/U required). DISPENSE: 1 day early if closed on fill date. WARN: No CNS-depressants within 8 hrs of med.   Orders:  No orders of the defined types were placed in this encounter.  Follow-up plan:   Return for [facility]): (R) Zilretta knee inj. #6.      Interventional Therapies  Risk Factors  Considerations  Medical Comorbidities:  Parkinson's disease  Hx right breast cancer  OIC  SOB  CKD Stage 3b  GERD  depression  osteopenia/osteoporosis  PSVT  Memory impairment     Planned  Pending:   Therapeutic bilateral lumbar facet block #7 (11/01/2023)    Under consideration:  Therapeutic right sided suprascapular nerve RFA #1  Therapeutic bilateral lumbar facet block    Completed:   Therapeutic right trapezius TPI x4 (07/17/2023) (100/100/0/0)  Therapeutic right thoracic TPI x2 (10/17/2018) (DKFUA)  Therapeutic right QL & ESM TPI x1 (02/12/2018)   Diagnostic right SI joint Blk x3 (12/21/2020)  (DKFUA)  Therapeutic right SI joint RFA x4 (03/01/2023) (100/100/70/70)   Therapeutic right lumbar facet MBB (FCT: L3-4,L4-5,L5-S1) x5 (08/09/2021) (100/100/50)  Therapeutic right lumbar facet MBB (FCT: L5-S1) x1 (08/29/2022) (100/100/100/0)  Therapeutic right lumbar facet RFA x4 (03/01/2023) (100/100/70/70)   Therapeutic bilateral L5 and L2 TFESI x1 (07/03/2023) (100/90/50/L:100R:20))  Therapeutic right L3 and L4 TFESI x1 (07/27/2022) (100/100/75/50)  Therapeutic right L5 TFESI x4 (02/14/2022) (100/100/70/75)   Therapeutic right S1 TFESI x1 (11/04/2020) (100/100/20/<50)   Therapeutic right L3-4 LESI x2 (11/04/2020) (100/100/20/<50)  Therapeutic right L4-5 LESI x2 (03/30/2022) (100/100/75/75)  Therapeutic right L5-S1 LESI x2 (12/04/2019) (100/100/50/75)  Therapeutic midline L2-3 LESI x1 (  10/04/2017) (100/100/85/>50)   Diagnostic bilateral superficial trochanteric bursa injec. x1 (04/30/2018) (DKFUA)  Diagnostic right IA hip injection x1 (02/13/2019) (DKFUA)   Diagnostic/therapeutic right IA steroid knee inj. x5 (07/31/2023) (100/100/100/80)  Therapeutic right (Zilretta) knee injection x2 (07/31/2023)  Therapeutic right (Monovisc) knee injection x1 (06/07/2021) (100/100/100/75)   Therapeutic left thoracic TPI x1 (10/17/2018) (DKFUA)  Diagnostic left SI joint Blk x2 (04/12/2023) (100/90/20/<25)   Therapeutic left lumbar facet MBB (L2-S1) (FCT: L3-4,L4-5,L5-S1) x5 (08/09/2021) (100/100/50)  Therapeutic left lumbar facet MBB (L4-S1) (FCT: L5-S1) x1 (08/29/2022) (100/100/100/100)  Therapeutic left lumbar facet RFA x2 (11/30/2022) (100/60/90/>75)    Therapeutic  Palliative (PRN) options:   Do not schedule any procedures without a proper preprocedure evaluation.   Completed by other providers:   Therapeutic right knee (ORTHOVISC) inj. x3 (05/18/2023, 05/23/2023, 05/30/2023) by Aram Candela, MD [facility]) Therapeutic left knee (Kenalog) inj. x1 (08/25/2020) by Novella Olive, MD [facility])   Pharmacotherapy  Nonopioids transfer 12/21/2020: Gabapentin       Recent Visits Date Type Provider Dept  11/01/23 Procedure visit [name], MD [facility]  10/16/23 Procedure visit [name], MD [facility]  09/19/23 Office Visit [name], MD [facility]  Showing recent visits within past 90 days and meeting all other requirements Today's Visits Date Type Provider Dept  11/12/23 Office Visit [name], MD [facility]   Showing today's visits and meeting all other requirements Future Appointments Date Type Provider Dept  11/13/23 Appointment [name], MD [facility]  11/20/23 Appointment [name], MD [facility]  Showing future appointments within next 90 days and meeting all other requirements  I discussed the assessment and treatment plan with the patient. The patient was provided an opportunity to ask questions and all were answered. The patient agreed with the plan and demonstrated an understanding of the instructions.  Patient advised to call back or seek an in-person evaluation if the symptoms or condition worsens.  Duration of encounter: 30 minutes.  Total time on encounter, as per AMA guidelines included both the face-to-face and non-face-to-face time personally spent by the physician and/or other qualified health care professional(s) on the day of the encounter (includes time in activities that require the physician or other qualified health care professional and does not include time in activities normally performed by clinical staff). Physician's time may include the following activities when performed: Preparing to see the patient (e.g., pre-charting review of records, searching for previously ordered imaging, lab work, and nerve conduction tests) Review of prior analgesic pharmacotherapies. Reviewing PMP Interpreting ordered tests (e.g., lab work, imaging, nerve conduction tests) Performing post-procedure evaluations, including interpretation of diagnostic procedures Obtaining and/or reviewing separately obtained history Performing a medically appropriate examination and/or evaluation Counseling and educating the patient/family/caregiver Ordering medications, tests, or procedures Referring and communicating with other health care professionals (when not separately reported) Documenting clinical information in the electronic or other health  record Independently interpreting results (not separately reported) and communicating results to the patient/ family/caregiver Care coordination (not separately reported)  Note by: Oswaldo Done, MD Date: 11/12/2023; Time: 4:13 PM

## 2023-11-12 NOTE — Patient Instructions (Addendum)
Fill 11-14-23 12-14-23 01-13-24   ______________________________________________________________________    Procedure instructions  Stop blood-thinners  Do not eat or drink fluids (other than water) for 6 hours before your procedure  No water for 2 hours before your procedure  Take your blood pressure medicine with a sip of water  Arrive 30 minutes before your appointment  If sedation is planned, bring suitable driver. Pennie Banter, Benedetto Goad, & public transportation are NOT APPROVED)  Carefully read the "Preparing for your procedure" detailed instructions  If you have questions call us at [phone]  ______________________________________________________________________      ______________________________________________________________________    Preparing for your procedure  Appointments: If you think you may not be able to keep your appointment, call 24-48 hours in advance to cancel. We need time to make it available to others.  During your procedure appointment there will be: No Prescription Refills. No disability issues to discussed. No medication changes or discussions.  Instructions: Food intake: Avoid eating anything solid for at least 8 hours prior to your procedure. Clear liquid intake: You may take clear liquids such as water up to 2 hours prior to your procedure. (No carbonated drinks. No soda.) Transportation: Unless otherwise stated by your physician, bring a driver. (Driver cannot be a Market researcher, Pharmacist, community, or any other form of public transportation.) Morning Medicines: Except for blood thinners, take all of your other morning medications with a sip of water. Make sure to take your heart and blood pressure medicines. If your blood pressure's lower number is above 100, the case will be rescheduled. Blood thinners: Make sure to stop your blood thinners as instructed.  If you take a blood thinner, but were not instructed to stop it, call our office [phone] and ask to talk  to a nurse. Not stopping a blood thinner prior to certain procedures could lead to serious complications. Diabetics on insulin: Notify the staff so that you can be scheduled 1st case in the morning. If your diabetes requires high dose insulin, take only  of your normal insulin dose the morning of the procedure and notify the staff that you have done so. Preventing infections: Shower with an antibacterial soap the morning of your procedure.  Build-up your immune system: Take 1000 mg of Vitamin C with every meal (3 times a day) the day prior to your procedure. Antibiotics: Inform the nursing staff if you are taking any antibiotics or if you have any conditions that may require antibiotics prior to procedures. (Example: recent joint implants)   Pregnancy: If you are pregnant make sure to notify the nursing staff. Not doing so may result in injury to the fetus, including death.  Sickness: If you have a cold, fever, or any active infections, call and cancel or reschedule your procedure. Receiving steroids while having an infection may result in complications. Arrival: You must be in the facility at least 30 minutes prior to your scheduled procedure. Tardiness: Your scheduled time is also the cutoff time. If you do not arrive at least 15 minutes prior to your procedure, you will be rescheduled.  Children: Do not bring any children with you. Make arrangements to keep them home. Dress appropriately: There is always a possibility that your clothing may get soiled. Avoid long dresses. Valuables: Do not bring any jewelry or valuables.  Reasons to call and reschedule or cancel your procedure: (Following these recommendations will minimize the risk of a serious complication.) Surgeries: Avoid having procedures within 2 weeks of any surgery. (Avoid for 2 weeks before  or after any surgery). Flu Shots: Avoid having procedures within 2 weeks of a flu shots or . (Avoid for 2 weeks before or after  immunizations). Barium: Avoid having a procedure within 7-10 days after having had a radiological study involving the use of radiological contrast. (Myelograms, Barium swallow or enema study). Heart attacks: Avoid any elective procedures or surgeries for the initial 6 months after a "Myocardial Infarction" (Heart Attack). Blood thinners: It is imperative that you stop these medications before procedures. Let us know if you if you take any blood thinner.  Infection: Avoid procedures during or within two weeks of an infection (including chest colds or gastrointestinal problems). Symptoms associated with infections include: Localized redness, fever, chills, night sweats or profuse sweating, burning sensation when voiding, cough, congestion, stuffiness, runny nose, sore throat, diarrhea, nausea, vomiting, cold or Flu symptoms, recent or current infections. It is specially important if the infection is over the area that we intend to treat. Heart and lung problems: Symptoms that may suggest an active cardiopulmonary problem include: cough, chest pain, breathing difficulties or shortness of breath, dizziness, ankle swelling, uncontrolled high or unusually low blood pressure, and/or palpitations. If you are experiencing any of these symptoms, cancel your procedure and contact your primary care physician for an evaluation.  Remember:  Regular Business hours are:  Monday to Thursday 8:00 AM to 4:00 PM  Provider's Schedule: [name], MD:  Procedure days: Tuesday and Thursday 7:30 AM to 4:00 PM  Edward Jolly, MD:  Procedure days: Monday and Wednesday 7:30 AM to 4:00 PM Last  Updated: 08/07/2023 ______________________________________________________________________      ______________________________________________________________________    General Risks and Possible Complications  Patient Responsibilities: It is important that you read this as it is part of your informed consent. It is our  duty to inform you of the risks and possible complications associated with treatments offered to you. It is your responsibility as a patient to read this and to ask questions about anything that is not clear or that you believe was not covered in this document.  Patient's Rights: You have the right to refuse treatment. You also have the right to change your mind, even after initially having agreed to have the treatment done. However, under this last option, if you wait until the last second to change your mind, you may be charged for the materials used up to that point.  Introduction: Medicine is not an Visual merchandiser. Everything in Medicine, including the lack of treatment(s), carries the potential for danger, harm, or loss (which is by definition: Risk). In Medicine, a complication is a secondary problem, condition, or disease that can aggravate an already existing one. All treatments carry the risk of possible complications. The fact that a side effects or complications occurs, does not imply that the treatment was conducted incorrectly. It must be clearly understood that these can happen even when everything is done following the highest safety standards.  No treatment: You can choose not to proceed with the proposed treatment alternative. The "PRO(s)" would include: avoiding the risk of complications associated with the therapy. The "CON(s)" would include: not getting any of the treatment benefits. These benefits fall under one of three categories: diagnostic; therapeutic; and/or palliative. Diagnostic benefits include: getting information which can ultimately lead to improvement of the disease or symptom(s). Therapeutic benefits are those associated with the successful treatment of the disease. Finally, palliative benefits are those related to the decrease of the primary symptoms, without necessarily curing the condition (example:  decreasing the pain from a flare-up of a chronic condition, such as  incurable terminal cancer).  General Risks and Complications: These are associated to most interventional treatments. They can occur alone, or in combination. They fall under one of the following six (6) categories: no benefit or worsening of symptoms; bleeding; infection; nerve damage; allergic reactions; and/or death. No benefits or worsening of symptoms: In Medicine there are no guarantees, only probabilities. No healthcare provider can ever guarantee that a medical treatment will work, they can only state the probability that it may. Furthermore, there is always the possibility that the condition may worsen, either directly, or indirectly, as a consequence of the treatment. Bleeding: This is more common if the patient is taking a blood thinner, either prescription or over the counter (example: Goody Powders, Fish oil, Aspirin, Garlic, etc.), or if suffering a condition associated with impaired coagulation (example: Hemophilia, cirrhosis of the liver, low platelet counts, etc.). However, even if you do not have one on these, it can still happen. If you have any of these conditions, or take one of these drugs, make sure to notify your treating physician. Infection: This is more common in patients with a compromised immune system, either due to disease (example: diabetes, cancer, human immunodeficiency virus [HIV], etc.), or due to medications or treatments (example: therapies used to treat cancer and rheumatological diseases). However, even if you do not have one on these, it can still happen. If you have any of these conditions, or take one of these drugs, make sure to notify your treating physician. Nerve Damage: This is more common when the treatment is an invasive one, but it can also happen with the use of medications, such as those used in the treatment of cancer. The damage can occur to small secondary nerves, or to large primary ones, such as those in the spinal cord and brain. This damage may be  temporary or permanent and it may lead to impairments that can range from temporary numbness to permanent paralysis and/or brain death. Allergic Reactions: Any time a substance or material comes in contact with our body, there is the possibility of an allergic reaction. These can range from a mild skin rash (contact dermatitis) to a severe systemic reaction (anaphylactic reaction), which can result in death. Death: In general, any medical intervention can result in death, most of the time due to an unforeseen complication. ______________________________________________________________________      ______________________________________________________________________    Opioid Pain Medication Update  To: All patients taking opioid pain medications. (I.e.: hydrocodone, hydromorphone, oxycodone, oxymorphone, morphine, codeine, methadone, tapentadol, tramadol, buprenorphine, fentanyl, etc.)  Re: Updated review of side effects and adverse reactions of opioid analgesics, as well as new information about long term effects of this class of medications.  Direct risks of long-term opioid therapy are not limited to opioid addiction and overdose. Potential medical risks include serious fractures, breathing problems during sleep, hyperalgesia, immunosuppression, chronic constipation, bowel obstruction, myocardial infarction, and tooth decay secondary to xerostomia.  Unpredictable adverse effects that can occur even if you take your medication correctly: Cognitive impairment, respiratory depression, and death. Most people think that if they take their medication "correctly", and "as instructed", that they will be safe. Nothing could be farther from the truth. In reality, a significant amount of recorded deaths associated with the use of opioids has occurred in individuals that had taken the medication for a long time, and were taking their medication correctly. The following are examples of how this can  happen:  Patient taking his/her medication for a long time, as instructed, without any side effects, is given a certain antibiotic or another unrelated medication, which in turn triggers a "Drug-to-drug interaction" leading to disorientation, cognitive impairment, impaired reflexes, respiratory depression or an untoward event leading to serious bodily harm or injury, including death.  Patient taking his/her medication for a long time, as instructed, without any side effects, develops an acute impairment of liver and/or kidney function. This will lead to a rapid inability of the body to breakdown and eliminate their pain medication, which will result in effects similar to an "overdose", but with the same medicine and dose that they had always taken. This again may lead to disorientation, cognitive impairment, impaired reflexes, respiratory depression or an untoward event leading to serious bodily harm or injury, including death.  A similar problem will occur with patients as they grow older and their liver and kidney function begins to decrease as part of the aging process.  Background information: Historically, the original case for using long-term opioid therapy to treat chronic noncancer pain was based on safety assumptions that subsequent experience has called into question. In 1996, the American Pain Society and the American Academy of Pain Medicine issued a consensus statement supporting long-term opioid therapy. This statement acknowledged the dangers of opioid prescribing but concluded that the risk for addiction was low; respiratory depression induced by opioids was short-lived, occurred mainly in opioid-naive patients, and was antagonized by pain; tolerance was not a common problem; and efforts to control diversion should not constrain opioid prescribing. This has now proven to be wrong. Experience regarding the risks for opioid addiction, misuse, and overdose in community practice has failed to  support these assumptions.  According to the Centers for Disease Control and Prevention, fatal overdoses involving opioid analgesics have increased sharply over the past decade. Currently, more than 96,700 people die from drug overdoses every year. Opioids are a factor in 7 out of every 10 overdose deaths. Deaths from drug overdose have surpassed motor vehicle accidents as the leading cause of death for individuals between the ages of 4 and 71.  Clinical data suggest that neuroendocrine dysfunction may be very common in both men and women, potentially causing hypogonadism, erectile dysfunction, infertility, decreased libido, osteoporosis, and depression. Recent studies linked higher opioid dose to increased opioid-related mortality. Controlled observational studies reported that long-term opioid therapy may be associated with increased risk for cardiovascular events. Subsequent meta-analysis concluded that the safety of long-term opioid therapy in elderly patients has not been proven.   Side Effects and adverse reactions: Common side effects: Drowsiness (sedation). Dizziness. Nausea and vomiting. Constipation. Physical dependence -- Dependence often manifests with withdrawal symptoms when opioids are discontinued or decreased. Tolerance -- As you take repeated doses of opioids, you require increased medication to experience the same effect of pain relief. Respiratory depression -- This can occur in healthy people, especially with higher doses. However, people with COPD, asthma or other lung conditions may be even more susceptible to fatal respiratory impairment.  Uncommon side effects: An increased sensitivity to feeling pain and extreme response to pain (hyperalgesia). Chronic use of opioids can lead to this. Delayed gastric emptying (the process by which the contents of your stomach are moved into your small intestine). Muscle rigidity. Immune system and hormonal dysfunction. Quick,  involuntary muscle jerks (myoclonus). Arrhythmia. Itchy skin (pruritus). Dry mouth (xerostomia).  Long-term side effects: Chronic constipation. Sleep-disordered breathing (SDB). Increased risk of bone fractures. Hypothalamic-pituitary-adrenal dysregulation. Increased risk of overdose.  RISKS: Respiratory depression and death: Opioids increase the risk of respiratory depression and death.  Drug-to-drug interactions: Opioids are relatively contraindicated in combination with benzodiazepines, sleep inducers, and other central nervous system depressants. Other classes of medications (i.e.: certain antibiotics and even over-the-counter medications) may also trigger or induce respiratory depression in some patients.  Medical conditions: Patients with pre-existing respiratory problems are at higher risk of respiratory failure and/or depression when in combination with opioid analgesics. Opioids are relatively contraindicated in some medical conditions such as central sleep apnea.   Fractures and Falls:  Opioids increase the risk and incidence of falls. This is of particular importance in elderly patients.  Endocrine System:  Long-term administration is associated with endocrine abnormalities (endocrinopathies). (Also known as Opioid-induced Endocrinopathy) Influences on both the hypothalamic-pituitary-adrenal axis?and the hypothalamic-pituitary-gonadal axis have been demonstrated with consequent hypogonadism and adrenal insufficiency in both sexes. Hypogonadism and decreased levels of dehydroepiandrosterone sulfate have been reported in men and women. Endocrine effects include: Amenorrhoea in women (abnormal absence of menstruation) Reduced libido in both sexes Decreased sexual function Erectile dysfunction in men Hypogonadisms (decreased testicular function with shrinkage of testicles) Infertility Depression and fatigue Loss of muscle mass Anxiety Depression Immune  suppression Hyperalgesia Weight gain Anemia Osteoporosis Patients (particularly women of childbearing age) should avoid opioids. There is insufficient evidence to recommend routine monitoring of asymptomatic patients taking opioids in the long-term for hormonal deficiencies.  Immune System: Human studies have demonstrated that opioids have an immunomodulating effect. These effects are mediated via opioid receptors both on immune effector cells and in the central nervous system. Opioids have been demonstrated to have adverse effects on antimicrobial response and anti-tumour surveillance. Buprenorphine has been demonstrated to have no impact on immune function.  Opioid Induced Hyperalgesia: Human studies have demonstrated that prolonged use of opioids can lead to a state of abnormal pain sensitivity, sometimes called opioid induced hyperalgesia (OIH). Opioid induced hyperalgesia is not usually seen in the absence of tolerance to opioid analgesia. Clinically, hyperalgesia may be diagnosed if the patient on long-term opioid therapy presents with increased pain. This might be qualitatively and anatomically distinct from pain related to disease progression or to breakthrough pain resulting from development of opioid tolerance. Pain associated with hyperalgesia tends to be more diffuse than the pre-existing pain and less defined in quality. Management of opioid induced hyperalgesia requires opioid dose reduction.  Cancer: Chronic opioid therapy has been associated with an increased risk of cancer among noncancer patients with chronic pain. This association was more evident in chronic strong opioid users. Chronic opioid consumption causes significant pathological changes in the small intestine and colon. Epidemiological studies have found that there is a link between opium dependence and initiation of gastrointestinal cancers. Cancer is the second leading cause of death after cardiovascular disease.  Chronic use of opioids can cause multiple conditions such as GERD, immunosuppression and renal damage as well as carcinogenic effects, which are associated with the incidence of cancers.   Mortality: Long-term opioid use has been associated with increased mortality among patients with chronic non-cancer pain (CNCP).  Prescription of long-acting opioids for chronic noncancer pain was associated with a significantly increased risk of all-cause mortality, including deaths from causes other than overdose.  Reference: Von Korff M, Kolodny A, Deyo RA, Chou R. Long-term opioid therapy reconsidered. Ann Intern Med. 2011 Sep 6;155(5):325-8. doi: 10.7326/[id number]. PMID: 16109604; PMCID: [id number]. Randon Goldsmith, Hayward RA, Dunn KM, [place] KP. Risk of adverse events in patients prescribed long-term opioids: A cohort  study in the [place] [facility] [employer]. Eur J Pain. 2019 May;23(5):908-922. doi: 10.1002/ejp.1357. Epub 2019 Jan 31. PMID: 08657846. Colameco S, Coren JS, [place] CA. Continuous opioid treatment for chronic noncancer pain: a time for moderation in prescribing. Postgrad Med. 2009 Jul;121(4):61-6. doi: 10.3810/pgm.2009.07.2032. PMID: 96295284. William Hamburger RN, [place], Blazina I, Cristopher [place], Bougatsos C, Deyo RA. The effectiveness and risks of long-term opioid therapy for chronic pain: a systematic review for a [employer]ways to [employer]. Ann Intern Med. 2015 Feb 17;162(4):276-86. doi: 10.7326/M14-2559. PMID: 13244010. Caryl Bis [facility], [place] DM. NCHS Data Brief No. 22. [place]: Centers for Disease Control and Prevention; 2009. Sep, Increase in Fatal Poisonings Involving Opioid Analgesics in the [place], 1999-2006. Song IA, Choi HR, Oh TK. Long-term opioid use and mortality in patients with chronic non-cancer pain: Ten-year follow-up study in [place] from 2010 through 2019.  EClinicalMedicine. 2022 Jul 18;51:101558. doi: 10.1016/j.eclinm.2022.272536. PMID: 64403474; PMCID: [id number]. Huser, W., Schubert, T., Vogelmann, T. et al. All-cause mortality in patients with long-term opioid therapy compared with non-opioid analgesics for chronic non-cancer pain: a database study. BMC Med 18, 162 (2020). http://lester.info/ Rashidian H, Karie Kirks, Malekzadeh R, Haghdoost AA. An Ecological Study of the Association between Opiate Use and Incidence of Cancers. Addict Health. 2016 Fall;8(4):252-260. PMID: 43329518; PMCID: [id number].  Our Goal: Our goal is to control your pain with means other than the use of opioid pain medications.  Our Recommendation: Talk to your physician about coming off of these medications. We can assist you with the tapering down and stopping these medicines. Based on the new information, even if you cannot completely stop the medication, a decrease in the dose may be associated with a lesser risk. Ask for other means of controlling the pain. Decrease or eliminate those factors that significantly contribute to your pain such as smoking, obesity, and a diet heavily tilted towards "inflammatory" nutrients.  Last Updated: 06/25/2023   ______________________________________________________________________       ______________________________________________________________________    National Pain Medication Shortage  The [place] is experiencing worsening drug shortages. These have had a negative widespread effect on patient care and treatment. Not expected to improve any time soon. Predicted to last past 2029.   Drug shortage list (generic names) Oxycodone IR Oxycodone/APAP Oxymorphone IR Hydromorphone Hydrocodone/APAP Morphine  Where is the problem?  Manufacturing and supply level.  Will this shortage affect you?  Only if you take any of the above pain medications.  How? You may be unable to fill your  prescription.  Your pharmacist may offer a "partial fill" of your prescription. (Warning: Do not accept partial fills.) Prescriptions partially filled cannot be transferred to another pharmacy. Read our Medication Rules and Regulation. Depending on how much medicine you are dependent on, you may experience withdrawals when unable to get the medication.  Recommendations: Consider ending your dependence on opioid pain medications. Ask your pain specialist to assist you with the process. Consider switching to a medication currently not in shortage, such as Buprenorphine. Talk to your pain specialist about this option. Consider decreasing your pain medication requirements by managing tolerance thru "Drug Holidays". This may help minimize withdrawals, should you run out of medicine. Control your pain thru the use of non-pharmacological interventional therapies.   Your prescriber: Prescribers cannot be blamed for shortages. Medication manufacturing and supply issues cannot be fixed by the prescriber.   NOTE: The prescriber is not responsible for supplying the medication, or solving  supply issues. Work with your pharmacist to solve it. The patient is responsible for the decision to take or continue taking the medication and for identifying and securing a legal supply source. By law, supplying the medication is the job and responsibility of the pharmacy. The prescriber is responsible for the evaluation, monitoring, and prescribing of these medications.   Prescribers will NOT: Re-issue prescriptions that have been partially filled. Re-issue prescriptions already sent to a pharmacy.  Re-send prescriptions to a different pharmacy because yours did not have your medication. Ask pharmacist to order more medicine or transfer the prescription to another pharmacy. (Read below.)  New 2023 regulation: "August 18, 2022 Revised Regulation Allows DEA-Registered Pharmacies to Transfer Electronic Prescriptions at  a Patient's Request DEA Headquarters Division - Public Information Office Patients now have the ability to request their electronic prescription be transferred to another pharmacy without having to go back to their practitioner to initiate the request. This revised regulation went into effect on Monday, August 14, 2022.     At a patient's request, a DEA-registered retail pharmacy can now transfer an electronic prescription for a controlled substance (schedules II-V) to another DEA-registered retail pharmacy. Prior to this change, patients would have to go through their practitioner to cancel their prescription and have it re-issued to a different pharmacy. The process was taxing and time consuming for both patients and practitioners.    The Drug Enforcement Administration [facility]) published its intent to revise the process for transferring electronic prescriptions on November 05, 2020.  The final rule was published in the federal register on July 13, 2022 and went into effect 30 days later.  Under the final rule, a prescription can only be transferred once between pharmacies, and only if allowed under existing state or other applicable law. The prescription must remain in its electronic form; may not be altered in any way; and the transfer must be communicated directly between two licensed pharmacists. It's important to note, any authorized refills transfer with the original prescription, which means the entire prescription will be filled at the same pharmacy".  Reference: [employer]ugeHand[employer]is [facility] website announcement)  [email or web address] [employer] of [employer])   [employer] / Vol. 88, No. 143 / Thursday, July 13, 2022 / Rules and Regulations DEPARTMENT OF JUSTICE  Drug Enforcement Administration  21 CFR Part 1306  [Docket No. [id number]]   RIN [id number] Transfer of Electronic Prescriptions for Schedules II-V Controlled Substances Between Pharmacies for Initial Filling  ______________________________________________________________________       ______________________________________________________________________    Transfer of Pain Medication between Pharmacies  Re: 2023 DEA Clarification on existing regulation  Published on DEA Website: August 18, 2022  Title: Revised Regulation Allows DEA-Registered Pharmacies to Electrical engineer Prescriptions at a Patient's Request DEA Headquarters Division - [employer]  "Patients now have the ability to request their electronic prescription be transferred to another pharmacy without having to go back to their practitioner to initiate the request. This revised regulation went into effect on Monday, August 14, 2022.     At a patient's request, a DEA-registered retail pharmacy can now transfer an electronic prescription for a controlled substance (schedules II-V) to another DEA-registered retail pharmacy. Prior to this change, patients would have to go through their practitioner to cancel their prescription and have it re-issued to a different pharmacy. The process was taxing and time consuming for both patients and practitioners.    The Drug Enforcement Administration [facility]) published its intent to revise the process for transferring electronic  prescriptions on November 05, 2020.  The final rule was published in the federal register on July 13, 2022 and went into effect 30 days later.  Under the final rule, a prescription can only be transferred once between pharmacies, and only if allowed under existing state or other applicable law. The prescription must remain in its electronic form; may not be altered in any way; and the transfer must be communicated directly between two licensed pharmacists. It's important to note, any authorized refills transfer with the original  prescription, which means the entire prescription will be filled at the same pharmacy."    REFERENCES: 1. DEA website announcement [employer]Hand[employer][email or web address]  2. Department of Justice website  [email or web address].pdf  3. DEPARTMENT OF JUSTICE Drug Enforcement Administration 21 CFR Part 1306 [Docket No. [id number]] RIN 1117-AB64 "Transfer of Electronic Prescriptions for Schedules II-V Controlled Substances Between Pharmacies for Initial Filling"  ______________________________________________________________________       ______________________________________________________________________    Medication Rules  Purpose: To inform patients, and their family members, of our medication rules and regulations.  Applies to: All patients receiving prescriptions from our practice (written or electronic).  Pharmacy of record: This is the pharmacy where your electronic prescriptions will be sent. Make sure we have the correct one.  Electronic prescriptions: In compliance with the [facility] Strengthen Opioid Misuse Prevention (STOP) Act of 2017 (Session Conni Elliot [phone]), effective December 18, 2018, all controlled substances must be electronically prescribed. Written prescriptions, faxing, or calling prescriptions to a pharmacy will no longer be done.  Prescription refills: These will be provided only during in-person appointments. No medications will be renewed without a "face-to-face" evaluation with your provider. Applies to all prescriptions.  NOTE: The following applies primarily to controlled substances (Opioid* Pain Medications).   Type of encounter (visit): For patients receiving controlled substances, face-to-face visits are required. (Not an option and not up to the patient.)  Patient's Responsibilities: Pain Pills: Bring all pain pills to every  appointment (except for procedure appointments). Pill counts are required.  Pill Bottles: Bring pills in original pharmacy bottle. Bring bottle, even if empty. Always bring the bottle of the most recent fill.  Medication refills: You are responsible for knowing and keeping track of what medications you are taking and when is it that you will need a refill. The day before your appointment: write a list of all prescriptions that need to be refilled. The day of the appointment: give the list to the admitting nurse. Prescriptions will be written only during appointments. No prescriptions will be written on procedure days. If you forget a medication: it will not be "Called in", "Faxed", or "electronically sent". You will need to get another appointment to get these prescribed. No early refills. Do not call asking to have your prescription filled early. Partial  or short prescriptions: Occasionally your pharmacy may not have enough pills to fill your prescription.  NEVER ACCEPT a partial fill or a prescription that is short of the total amount of pills that you were prescribed.  With controlled substances the law allows 72 hours for the pharmacy to complete the prescription.  If the prescription is not completed within 72 hours, the pharmacist will require a new prescription to be written. This means that you will be short on your medicine and we WILL NOT send another prescription to complete your original prescription.  Instead, request the pharmacy to send a carrier to a nearby branch to get enough medication to provide you with your full prescription. Prescription Accuracy: You are  responsible for carefully inspecting your prescriptions before leaving our office. Have the discharge nurse carefully go over each prescription with you, before taking them home. Make sure that your name is accurately spelled, that your address is correct. Check the name and dose of your medication to make sure it is accurate. Check  the number of pills, and the written instructions to make sure they are clear and accurate. Make sure that you are given enough medication to last until your next medication refill appointment. Taking Medication: Take medication as prescribed. When it comes to controlled substances, taking less pills or less frequently than prescribed is permitted and encouraged. Never take more pills than instructed. Never take the medication more frequently than prescribed.  Inform other Doctors: Always inform, all of your healthcare providers, of all the medications you take. Pain Medication from other Providers: You are not allowed to accept any additional pain medication from any other Doctor or Healthcare provider. There are two exceptions to this rule. (see below) In the event that you require additional pain medication, you are responsible for notifying us, as stated below. Cough Medicine: Often these contain an opioid, such as codeine or hydrocodone. Never accept or take cough medicine containing these opioids if you are already taking an opioid* medication. The combination may cause respiratory failure and death. Medication Agreement: You are responsible for carefully reading and following our Medication Agreement. This must be signed before receiving any prescriptions from our practice. Safely store a copy of your signed Agreement. Violations to the Agreement will result in no further prescriptions. (Additional copies of our Medication Agreement are available upon request.) Laws, Rules, & Regulations: All patients are expected to follow all [address] and [employer], Rules, [place] [employer]. Ignorance of the Laws does not constitute a valid excuse.  Illegal drugs and Controlled Substances: The use of illegal substances (including, but not limited to marijuana and its derivatives) and/or the illegal use of any controlled substances is strictly prohibited. Violation of this rule may result in the immediate and  permanent discontinuation of any and all prescriptions being written by our practice. The use of any illegal substances is prohibited. Adopted CDC guidelines & recommendations: Target dosing levels will be at or below 60 MME/day. Use of benzodiazepines** is not recommended. Urine Drug testing: Patients taking controlled substances will be required to provide a urine sample upon request. Do not void before coming to your medication management appointments. Hold emptying your bladder until you are admitted. The admitting nurse will inform you if a sample is required. Our practice reserves the right to call you at any time to provide a sample. Once receiving the call, you have 24 hours to comply with request. Not providing a sample upon request may result in termination of medication therapy.  Exceptions: There are only two exceptions to the rule of not receiving pain medications from other Healthcare Providers. Exception #1 (Emergencies): In the event of an emergency (i.e.: accident requiring emergency care), you are allowed to receive additional pain medication. However, you are responsible for: As soon as you are able, call our office [phone], at any time of the day or night, and leave a message stating your name, the date and nature of the emergency, and the name and dose of the medication prescribed. In the event that your call is answered by a member of our staff, make sure to document and save the date, time, and the name of the person that took your information.  Exception #  2 (Planned Surgery): In the event that you are scheduled by another doctor or dentist to have any type of surgery or procedure, you are allowed (for a period no longer than 30 days), to receive additional pain medication, for the acute post-op pain. However, in this case, you are responsible for picking up a copy of our "Post-op Pain Management for Surgeons" handout, and giving it to your surgeon or dentist. This document is  available at our office, and does not require an appointment to obtain it. Simply go to our office during business hours (Monday-Thursday from 8:00 AM to 4:00 PM) (Friday 8:00 AM to 12:00 Noon) or if you have a scheduled appointment with [place], prior to your surgery, and ask for it by name. In addition, you are responsible for: calling our office [phone], at any time of the day or night, and leaving a message stating your name, name of your surgeon, type of surgery, and date of procedure or surgery. Failure to comply with your responsibilities may result in termination of therapy involving the controlled substances.  Consequences:  Non-compliance with the above rules may result in permanent discontinuation of medication prescription therapy. All patients receiving any type of controlled substance is expected to comply with the above patient responsibilities. Not doing so may result in permanent discontinuation of medication prescription therapy. Medication Agreement Violation. Following the above rules, including your responsibilities will help you in avoiding a Medication Agreement Violation ("Breaking your Pain Medication Contract").  *Opioid medications include: morphine, codeine, oxycodone, oxymorphone, hydrocodone, hydromorphone, meperidine, tramadol, tapentadol, buprenorphine, fentanyl, methadone. **Benzodiazepine medications include: diazepam (Valium), alprazolam (Xanax), clonazepam (Klonopine), lorazepam (Ativan), clorazepate (Tranxene), chlordiazepoxide (Librium), estazolam (Prosom), oxazepam (Serax), temazepam (Restoril), triazolam (Halcion) (Last updated: 10/10/2023) ______________________________________________________________________      ______________________________________________________________________    Medication Recommendations and Reminders  Applies to: All patients receiving prescriptions (written and/or electronic).  Medication Rules & Regulations: You are  responsible for reading, knowing, and following our "Medication Rules" document. These exist for your safety and that of others. They are not flexible and neither are we. Dismissing or ignoring them is an act of "non-compliance" that may result in complete and irreversible termination of such medication therapy. For safety reasons, "non-compliance" will not be tolerated. As with the [place] fundamental legal principle of "ignorance of the law is no defense", we will accept no excuses for not having read and knowing the content of documents provided to you by our practice.  Pharmacy of record:  Definition: This is the pharmacy where your electronic prescriptions will be sent.  We do not endorse any particular pharmacy. It is up to you and your insurance to decide what pharmacy to use.  We do not restrict you in your choice of pharmacy. However, once we write for your prescriptions, we will NOT be re-sending more prescriptions to fix restricted supply problems created by your pharmacy, or your insurance.  The pharmacy listed in the electronic medical record should be the one where you want electronic prescriptions to be sent. If you choose to change pharmacy, simply notify our nursing staff. Changes will be made only during your regular appointments and not over the phone.  Recommendations: Keep all of your pain medications in a safe place, under lock and key, even if you live alone. We will NOT replace lost, stolen, or damaged medication. We do not accept "Police Reports" as proof of medications having been stolen. After you fill your prescription, take 1 week's worth of pills and put them away  in a safe place. You should keep a separate, properly labeled bottle for this purpose. The remainder should be kept in the original bottle. Use this as your primary supply, until it runs out. Once it's gone, then you know that you have 1 week's worth of medicine, and it is time to come in for a prescription refill. If  you do this correctly, it is unlikely that you will ever run out of medicine. To make sure that the above recommendation works, it is very important that you make sure your medication refill appointments are scheduled at least 1 week before you run out of medicine. To do this in an effective manner, make sure that you do not leave the office without scheduling your next medication management appointment. Always ask the nursing staff to show you in your prescription , when your medication will be running out. Then arrange for the receptionist to get you a return appointment, at least 7 days before you run out of medicine. Do not wait until you have 1 or 2 pills left, to come in. This is very poor planning and does not take into consideration that we may need to cancel appointments due to bad weather, sickness, or emergencies affecting our staff. DO NOT ACCEPT A "Partial Fill": If for any reason your pharmacy does not have enough pills/tablets to completely fill or refill your prescription, do not allow for a "partial fill". The law allows the pharmacy to complete that prescription within 72 hours, without requiring a new prescription. If they do not fill the rest of your prescription within those 72 hours, you will need a separate prescription to fill the remaining amount, which we will NOT provide. If the reason for the partial fill is your insurance, you will need to talk to the pharmacist about payment alternatives for the remaining tablets, but again, DO NOT ACCEPT A PARTIAL FILL, unless you can trust your pharmacist to obtain the remainder of the pills within 72 hours.  Prescription refills and/or changes in medication(s):  Prescription refills, and/or changes in dose or medication, will be conducted only during scheduled medication management appointments. (Applies to both, written and electronic prescriptions.) No refills on procedure days. No medication will be changed or started on procedure days. No  changes, adjustments, and/or refills will be conducted on a procedure day. Doing so will interfere with the diagnostic portion of the procedure. No phone refills. No medications will be "called into the pharmacy". No Fax refills. No weekend refills. No Holliday refills. No after hours refills.  Remember:  Business hours are:  Monday to Thursday 8:00 AM to 4:00 PM Provider's Schedule: [name], MD - Appointments are:  Medication management: Monday and Wednesday 8:00 AM to 4:00 PM Procedure day: Tuesday and Thursday 7:30 AM to 4:00 PM Edward Jolly, MD - Appointments are:  Medication management: Tuesday and Thursday 8:00 AM to 4:00 PM Procedure day: Monday and Wednesday 7:30 AM to 4:00 PM (Last update: 10/10/2022) ______________________________________________________________________      ______________________________________________________________________     Naloxone Nasal Spray  Why am I receiving this medication? [place] STOP ACT requires that all patients taking high dose opioids or at risk of opioids respiratory depression, be prescribed an opioid reversal agent, such as Naloxone (AKA: Narcan).  What is this medication? NALOXONE (nal OX one) treats opioid overdose, which causes slow or shallow breathing, severe drowsiness, or trouble staying awake. Call emergency services after using this medication. You may need additional treatment. Naloxone works by reversing  the effects of opioids. It belongs to a group of medications called opioid blockers.  COMMON BRAND NAME(S): Kloxxado, Narcan  What should I tell my care team before I take this medication? They need to know if you have any of these conditions: Heart disease Substance use disorder An unusual or allergic reaction to naloxone, other medications, foods, dyes, or preservatives Pregnant or trying to get pregnant Breast-feeding  When to use this medication? This medication is to be used for the  treatment of respiratory depression (less than 8 breaths per minute) secondary to opioid overdose.   How to use this medication? This medication is for use in the nose. Lay the person on their back. Support their neck with your hand and allow the head to tilt back before giving the medication. The nasal spray should be given into 1 nostril. After giving the medication, move the person onto their side. Do not remove or test the nasal spray until ready to use. Get emergency medical help right away after giving the first dose of this medication, even if the person wakes up. You should be familiar with how to recognize the signs and symptoms of a narcotic overdose. If more doses are needed, give the additional dose in the other nostril. Talk to your care team about the use of this medication in children. While this medication may be prescribed for children as young as newborns for selected conditions, precautions do apply.  Naloxone Overdosage: If you think you have taken too much of this medicine contact a poison control center or emergency room at once.  NOTE: This medicine is only for you. Do not share this medicine with others.  What if I miss a dose? This does not apply.  What may interact with this medication? This is only used during an emergency. No interactions are expected during emergency use. This list may not describe all possible interactions. Give your health care provider a list of all the medicines, herbs, non-prescription drugs, or dietary supplements you use. Also tell them if you smoke, drink alcohol, or use illegal drugs. Some items may interact with your medicine.  What should I watch for while using this medication? Keep this medication ready for use in the case of an opioid overdose. Make sure that you have the phone number of your care team and local hospital ready. You may need to have additional doses of this medication. Each nasal spray contains a single dose. Some  emergencies may require additional doses. After use, bring the treated person to the nearest hospital or call 911. Make sure the treating care team knows that the person has received a dose of this medication. You will receive additional instructions on what to do during and after use of this medication before an emergency occurs.  What side effects may I notice from receiving this medication? Side effects that you should report to your care team as soon as possible: Allergic reactions--skin rash, itching, hives, swelling of the face, lips, tongue, or throat Side effects that usually do not require medical attention (report these to your care team if they continue or are bothersome): Constipation Dryness or irritation inside the nose Headache Increase in blood pressure Muscle spasms Stuffy nose Toothache This list may not describe all possible side effects. Call your doctor for medical advice about side effects. You may report side effects to FDA at 1-800-FDA-1088.  Where should I keep my medication? Because this is an emergency medication, you should keep it with you at  all times.  Keep out of the reach of children and pets. Store between 20 and 25 degrees C (68 and 77 degrees F). Do not freeze. Throw away any unused medication after the expiration date. Keep in original box until ready to use.  NOTE: This sheet is a summary. It may not cover all possible information. If you have questions about this medicine, talk to your doctor, pharmacist, or health care provider.   2023 Elsevier/Gold Standard (2021-08-12 00:00:00)  ______________________________________________________________________

## 2023-11-12 NOTE — Progress Notes (Signed)
Nursing Pain Medication Assessment:  Safety precautions to be maintained throughout the outpatient stay will include: orient to surroundings, keep bed in low position, maintain call bell within reach at all times, provide assistance with transfer out of bed and ambulation.  Medication Inspection Compliance: Pill count conducted under aseptic conditions, in front of the patient. Neither the pills nor the bottle was removed from the patient's sight at any time. Once count was completed pills were immediately returned to the patient in their original bottle.  Medication: Oxycodone IR Pill/Patch Count:  33 of 120 pills remain Pill/Patch Appearance: Markings consistent with prescribed medication Bottle Appearance: Standard pharmacy container. Clearly labeled. Filled Date: 68 / 28 / 2024 Last Medication intake:  Today

## 2023-11-13 ENCOUNTER — Ambulatory Visit: Payer: Medicare HMO | Attending: Pain Medicine | Admitting: Pain Medicine

## 2023-11-13 ENCOUNTER — Encounter: Payer: Self-pay | Admitting: Pain Medicine

## 2023-11-13 VITALS — BP 120/79 | HR 62 | Temp 97.1°F | Resp 14 | Ht 63.0 in | Wt 159.0 lb

## 2023-11-13 DIAGNOSIS — G8929 Other chronic pain: Secondary | ICD-10-CM | POA: Diagnosis not present

## 2023-11-13 DIAGNOSIS — R936 Abnormal findings on diagnostic imaging of limbs: Secondary | ICD-10-CM | POA: Diagnosis not present

## 2023-11-13 DIAGNOSIS — E8889 Other specified metabolic disorders: Secondary | ICD-10-CM | POA: Diagnosis not present

## 2023-11-13 DIAGNOSIS — M25561 Pain in right knee: Secondary | ICD-10-CM

## 2023-11-13 DIAGNOSIS — M7051 Other bursitis of knee, right knee: Secondary | ICD-10-CM | POA: Diagnosis not present

## 2023-11-13 DIAGNOSIS — M25061 Hemarthrosis, right knee: Secondary | ICD-10-CM

## 2023-11-13 DIAGNOSIS — M1711 Unilateral primary osteoarthritis, right knee: Secondary | ICD-10-CM | POA: Diagnosis not present

## 2023-11-13 DIAGNOSIS — M7050 Other bursitis of knee, unspecified knee: Secondary | ICD-10-CM | POA: Diagnosis not present

## 2023-11-13 MED ORDER — METHYLPREDNISOLONE ACETATE 80 MG/ML IJ SUSP
80.0000 mg | Freq: Once | INTRAMUSCULAR | Status: AC
  Administered 2023-11-13: 80 mg via INTRA_ARTICULAR
  Filled 2023-11-13: qty 1

## 2023-11-13 MED ORDER — LIDOCAINE HCL 2 % IJ SOLN
20.0000 mL | Freq: Once | INTRAMUSCULAR | Status: AC
  Administered 2023-11-13: 400 mg
  Filled 2023-11-13: qty 20

## 2023-11-13 MED ORDER — LIDOCAINE HCL (PF) 2 % IJ SOLN
10.0000 mL | Freq: Once | INTRAMUSCULAR | Status: DC
  Filled 2023-11-13: qty 10

## 2023-11-13 MED ORDER — ROPIVACAINE HCL 2 MG/ML IJ SOLN
10.0000 mL | Freq: Once | INTRAMUSCULAR | Status: AC
  Administered 2023-11-13: 20 mL via INTRA_ARTICULAR
  Filled 2023-11-13: qty 20

## 2023-11-13 MED ORDER — PENTAFLUOROPROP-TETRAFLUOROETH EX AERO
INHALATION_SPRAY | Freq: Once | CUTANEOUS | Status: AC
  Administered 2023-11-13: 30 via TOPICAL

## 2023-11-13 MED ORDER — TRIAMCINOLONE ACETONIDE 32 MG IX SRER
32.0000 mg | Freq: Once | INTRA_ARTICULAR | Status: AC
  Administered 2023-11-13: 32 mg via INTRA_ARTICULAR
  Filled 2023-11-13: qty 5

## 2023-11-13 NOTE — Patient Instructions (Signed)

## 2023-11-13 NOTE — Progress Notes (Signed)
Three ml of dark blood aspirated from right knee per Dr. Laban Emperor. Blood does not clot after several minutes in the syringe.

## 2023-11-13 NOTE — Progress Notes (Signed)
PROVIDER NOTE: Interpretation of information contained herein should be left to medically-trained personnel. Specific patient instructions are provided elsewhere under "Patient Instructions" section of medical record. This document was created in part using STT-dictation technology, any transcriptional errors that may result from this process are unintentional.  Patient: Wanda Martinez Type: Established DOB: [birth year] MRN: [id number] PCP: Wanda [place], MD  Service: Procedure DOS: 11/13/2023 Setting: Ambulatory Location: Ambulatory outpatient facility Delivery: Face-to-face Provider: Oswaldo Done, MD Specialty: Interventional Pain Management Specialty designation: 09 Location: Outpatient facility Ref. Prov.: Wanda Normal, MD       Interventional Therapy   Type:  Zilretta (ER-triamcinolone/steroid) Intra-articular Knee Injection  #6   Laterality: Right (-RT) Level/approach: Lateral Imaging guidance: None required ([id number]) Anesthesia: Local anesthesia (1-2% Lidocaine) Anxiolysis: None                 Sedation: No Sedation                       DOS: 11/13/2023  Performed by: Oswaldo Done, MD  Purpose: Diagnostic/Therapeutic Indications: Knee arthralgia associated to osteoarthritis of the knee 1. Chronic knee pain (Right)   2. Osteoarthritis of knee (Right)   3. Tricompartment osteoarthritis of knee (Right)   4. Chronic patellofemoral pain of knee (Right)   5. Infrapatellar bursitis of knee (Right)   6. Hoffa's pad syndrome (HCC) (Right)   7. Hoffa's fat pad disease (HCC) (Right)   8. Medial knee pain (Right)   9. Pes anserine bursitis (Right)   10. Abnormal MRI, knee (Right) (01/04/2022)    NAS-11 score:   Pre-procedure: 8 /10   Post-procedure: 0-No pain/10    Discussed the use of AI scribe software for clinical note transcription with the patient, who gave verbal consent to proceed.  History of Present Illness   The patient presents with  persistent pain in the right knee, primarily on the inside of the knee and below the patella. She denies experiencing pain above the kneecap but reports some discomfort on the outside of the knee. The patient's knee problems are multifaceted, with an MRI from two years ago revealing tricompartmental osteoarthritis, Hoffa's pad syndrome, and a swollen bursa. The patient also had some fluid in the knee at the time of the MRI.  During the procedure, approximately 3ml of old, non-clotting blood was aspirated from the knee joint, suggesting a possible internal injury.  The patient's knee pain has significantly impacted her mobility and she expresses a desire for a knee replacement if deemed medically appropriate. She reports that the other knee also causes some discomfort, but not to the same extent as the right knee.      Pre-Procedure Preparation  Monitoring: As per clinic protocol.  Risk Assessment: Vitals:  YQM:VHQIONGEX body mass index is 28.17 kg/m as calculated from the following:   Height as of this encounter: 5\' 3"  (1.6 m).   Weight as of this encounter: 159 lb (72.1 kg)., Rate:62 , BP:120/79, Resp:14, Temp:(!) 97.1 F (36.2 C), SpO2:95 %  Allergies: She is allergic to sulfa antibiotics and vesicare [solifenacin].  Precautions: No additional precautions required  Blood-thinner(s): None at this time  Coagulopathies: Reviewed. None identified.   Active Infection(s): Reviewed. None identified. Wanda Martinez is afebrile   Location setting: Exam room Position: Sitting w/ knee bent 90 degrees Safety Precautions: Patient was assessed for positional comfort and pressure points before starting the procedure. Prepping solution: DuraPrep (Iodine Povacrylex [0.7% available iodine] and Isopropyl Alcohol, 74% w/w) Prep  Area: Entire knee region Approach: percutaneous, just above the tibial plateau, lateral to the infrapatellar tendon. Intended target: Intra-articular knee space Materials: Tray:  Block Needle(s): Regular Qty: 1/side Length: 1.5-inch Gauge: 25G (x1) + 22G (x1)  Meds ordered this encounter  Medications   methylPREDNISolone acetate (DEPO-MEDROL) injection 80 mg   lidocaine HCl (PF) (XYLOCAINE) 2 % injection 10 mL   ropivacaine (PF) 2 mg/mL (0.2%) (NAROPIN) injection 10 mL   lidocaine (XYLOCAINE) 2 % (with pres) injection 400 mg   pentafluoroprop-tetrafluoroeth (GEBAUERS) aerosol   Triamcinolone Acetonide (ZILRETTA) intra-articular injection 32 mg    Maintain refrigerated.  Prepared suspension may be stored up to 4 hours at ambient conditions.    Orders Placed This Encounter  Procedures   KNEE INJECTION    Local Anesthetic & Steroid injection.    Scheduling Instructions:     Side(s): Right Knee     Sedation: None     Timeframe: Today    Order Specific Question:   Where will this procedure be performed?    Answer:   [facility]   MR KNEE RIGHT WO CONTRAST    Standing Status:   Future    Standing Expiration Date:   02/13/2024    Scheduling Instructions:     Please make sure that the patient understands that this needs to be done as soon as possible. Never have the patient do the imaging "just before the next appointment". Inform patient that having the imaging done within the [facility] will expedite the availability of the results and will provide      imaging availability to the requesting physician. In addition inform the patient that the imaging order has an expiration date and will not be renewed if not done within the active period.    Order Specific Question:   What is the patient's sedation requirement?    Answer:   No Sedation    Order Specific Question:   Does the patient have a pacemaker or implanted devices?    Answer:   No    Order Specific Question:   Preferred imaging location?    Answer:   [facility] (table limit-350lbs)    Order Specific Question:   Call Results- Best Contact Number?    Answer:   [phone] [place]  [facility] at [facility]    Order Specific Question:   Radiology Contrast Protocol - do NOT remove file path    Answer:   [email or web address]   Informed Consent Details: Physician/Practitioner Attestation; Transcribe to consent form and obtain patient signature    Note: Always confirm laterality of pain with [name], before procedure. Transcribe to consent form and obtain patient signature.    Order Specific Question:   Physician/Practitioner attestation of informed consent for procedure/surgical case    Answer:   I, the physician/practitioner, attest that I have discussed with the patient the benefits, risks, side effects, alternatives, likelihood of achieving goals and potential problems during recovery for the procedure that I have provided informed consent.    Order Specific Question:   Procedure    Answer:   Right-sided intra-articular knee arthrocentesis (aspiration and/or injection)    Order Specific Question:   Physician/Practitioner performing the procedure    Answer:   Neal Oshea A. Laban Emperor, MD    Order Specific Question:   Indication/Reason    Answer:   Chronic right-sided knee pain secondary to knee arthropathy/arthralgia   Provide equipment / supplies at bedside    Procedure tray: "Block  Tray" (Disposable  single use) Skin infiltration needle: Regular 1.5-in, 25-G, (x1) Block Needle type: Regular Amount/quantity: 2 Size: Short(1.5-inch) Gauge: (25G x1) + (22G x1)    Standing Status:   Standing    Number of Occurrences:   1    Order Specific Question:   Specify    Answer:   Block Tray     Time-out: 1324 I initiated and conducted the "Time-out" before starting the procedure, as per protocol. The patient was asked to participate by confirming the accuracy of the "Time Out" information. Verification of the correct person, site, and procedure were performed and confirmed by me, the nursing staff, and the patient. "Time-out"  conducted as per Joint Commission's Universal Protocol (UP.01.01.01). Procedure checklist: Completed  H&P (Pre-op  Assessment)  Ms. Corrado is a 88 y.o. (year old), female patient, seen today for interventional treatment. She  has a past surgical history that includes Tonsillectomy and adenoidectomy (1979); Vesicovaginal fistula closure w/ TAH (1983); Breast surgery (1986); Breast enhancement surgery (1987); Breast implant removal; Breast implant removal (Right, 08/29/2012); Mastectomy (08/2012); Abdominal hysterectomy; Colonoscopy with propofol (N/A, 09/13/2016); Breast biopsy (2013); and Blepharoplasty. Ms. Mckean has a current medication list which includes the following prescription(s): amlodipine, aspirin ec, calcium carbonate, citalopram, citalopram, cyclobenzaprine, donepezil, gabapentin, glucosamine-chondroitin, hydrochlorothiazide, letrozole, losartan, lubiprostone, magnesium oxide, melatonin, metoprolol succinate, mirabegron er, multivitamin, naloxone, omega-3, [START ON 11/14/2023] oxycodone, [START ON 12/14/2023] oxycodone, [START ON 01/13/2024] oxycodone, pantoprazole, and rosuvastatin, and the following Facility-Administered Medications: lidocaine hcl (pf). Her primarily concern today is the Knee Pain (right)  She is allergic to sulfa antibiotics and vesicare [solifenacin].   Last encounter: My last encounter with her was on 11/12/2023. Pertinent problems: Ms. Patsy has Headache; Parkinson's disease (HCC); Chronic low back pain (1ry area of Pain) (Bilateral) (R>L) w/o sciatica; Lumbar spondylosis; Chronic hip pain (Bilateral); Chronic neck pain; Cervical spondylosis; Chronic cervical radicular pain (Right); Diffuse myofascial pain syndrome; Neurogenic pain; Chronic upper back pain (Right); Myofascial pain syndrome (Right) (cervicothoracic); Lumbar facet syndrome (Bilateral) (R>L); History of breast cancer; Cervical facet hypertrophy; Cervical facet syndrome (HCC); Chronic shoulder pain (Right);  Chronic pain syndrome; Chronic sacroiliac joint pain (Left); Chronic sacroiliac joint pain (Right); 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, L4-5, L5-S1) (Right); Lumbar spinal stenosis (with neurogenic claudication) (L3-4); Chronic lower extremity pain (Right); Chronic lumbar radicular pain (Right); Trochanteric bursitis of hip (Bilateral); Spondylosis without myelopathy or radiculopathy, lumbosacral region; Trigger point with back pain (Right); DDD (degenerative disc disease), lumbosacral; Chronic upper extremity pain (Right); Chronic thoracic back pain (Bilateral) (L>R); Trigger point of thoracic region (Bilateral) (L>R); Other specified dorsopathies, sacral and sacrococcygeal region; Sacroiliac joint dysfunction (Right); Osteoarthritis of sacroiliac joint (Right); Somatic dysfunction of sacroiliac joint (Right); Chronic musculoskeletal pain; Facial pain; Chronic hip pain (Right); Osteoarthritis of hip (Right); Left ear pain; 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HCC); Abnormal MRI, lumbar spine (08/23/2022); Osteoarthritis involving multiple joints; Other spondylosis, sacral and sacrococcygeal region; Lumbar facet hypertrophy (Multilevel) (Bilateral); Chronic knee pain (Right); Osteoarthritis of knee (Right); Trigger point of shoulder region (Right); Osteoarthritis of AC (acromioclavicular) joint (Right); Osteoarthritis of shoulder (Right); Unspecified injury of muscle(s) and tendon(s) of the rotator cuff of shoulder, sequela (Right); Cervicalgia; DDD (degenerative disc disease), lumbar; Lumbar radicular pain (Bilateral); Lumbar foraminal stenosis (Right: L3-4, L4-5, L5-S1) (Left: L2-3); Abnormal MRI, hip (Right) (05/04/2022); Abnormal MRI, knee (Right) (01/04/2022); Tricompartment osteoarthritis of knee (Right); Chronic low back pain (Right) w/o sciatica; Hoffa's fat pad disease (HCC) (Right); Hoffa's pad syndrome (HCC) (Right); Chronic low back pain (Right) w/ sciatica (Right);  Lower extremity weakness (Right); Chronic  radicular pain of lower back; Chronic sciatica (Right); Lumbosacral radiculopathy at L5 (Right); Low back pain of over 3 months duration; Personal history of breast cancer; Pes anserine bursitis (Right); Infrapatellar bursitis of knee (Right); Traumatic ecchymosis of right lower leg; Lumbar facet joint pain; Medial knee pain (Right); Chronic patellofemoral pain of knee (Right); Patellofemoral joint pain (Right); and Knee hemarthrosis (Right) on their pertinent problem list. Pain Assessment: Severity of Chronic pain is reported as a 8 /10. Location: Knee Right/deneis. Onset: More than a month ago. Quality: Stabbing. Timing: Constant. Modifying factor(s): nothing. Vitals:  height is 5\' 3"  (1.6 m) and weight is 159 lb (72.1 kg). Her temporal temperature is 97.1 F (36.2 C) (abnormal). Her blood pressure is 120/79 and her pulse is 62. Her respiration is 14 and oxygen saturation is 95%.   Physical Exam   EXTREMITIES: Medial aspect of the knee exhibits tenderness consistent with bursitis. MUSCULOSKELETAL: Presence of tricompartmental osteoarthritis in the lateral, medial, and patellofemoral compartments of the knee. Hoffa's pad syndrome indicated by swelling below the kneecap. SKIN: Three small incisions covered with Band-Aids post-procedure.     Reason for encounter: "interventional pain management therapy due pain of at least four (4) weeks in duration, with failure to respond and/or inability to tolerate more conservative care.  Site Confirmation: Ms. Wilensky was asked to confirm the procedure and laterality before marking the site.  Consent: Before the procedure and under the influence of no sedative(s), amnesic(s), or anxiolytics, the patient was informed of the treatment options, risks and possible complications. To fulfill our ethical and legal obligations, as recommended by the American Medical Association's Code of Ethics, I have informed the patient of my clinical impression; the nature and purpose  of the treatment or procedure; the risks, benefits, and possible complications of the intervention; the alternatives, including doing nothing; the risk(s) and benefit(s) of the alternative treatment(s) or procedure(s); and the risk(s) and benefit(s) of doing nothing. The patient was provided information about the general risks and possible complications associated with the procedure. These may include, but are not limited to: failure to achieve desired goals, infection, bleeding, organ or nerve damage, allergic reactions, paralysis, and death. In addition, the patient was informed of those risks and complications associated to Spine-related procedures, such as failure to decrease pain; infection (i.e.: Meningitis, epidural or intraspinal abscess); bleeding (i.e.: epidural hematoma, subarachnoid hemorrhage, or any other type of intraspinal or peri-dural bleeding); organ or nerve damage (i.e.: Any type of peripheral nerve, nerve root, or spinal cord injury) with subsequent damage to sensory, motor, and/or autonomic systems, resulting in permanent pain, numbness, and/or weakness of one or several areas of the body; allergic reactions; (i.e.: anaphylactic reaction); and/or death. Furthermore, the patient was informed of those risks and complications associated with the medications. These include, but are not limited to: allergic reactions (i.e.: anaphylactic or anaphylactoid reaction(s)); adrenal axis suppression; blood sugar elevation that in diabetics may result in ketoacidosis or comma; water retention that in patients with history of congestive heart failure may result in shortness of breath, pulmonary edema, and decompensation with resultant heart failure; weight gain; swelling or edema; medication-induced neural toxicity; particulate matter embolism and blood vessel occlusion with resultant organ, and/or nervous system infarction; and/or aseptic necrosis of one or more joints. Finally, the patient was informed  that Medicine is not an exact science; therefore, there is also the possibility of unforeseen or unpredictable risks and/or possible complications that may result in a catastrophic outcome.  The patient indicated having understood very clearly. We have given the patient no guarantees and we have made no promises. Enough time was given to the patient to ask questions, all of which were answered to the patient's satisfaction. Ms. Gubler has indicated that she wanted to continue with the procedure. Attestation: I, the ordering provider, attest that I have discussed with the patient the benefits, risks, side-effects, alternatives, likelihood of achieving goals, and potential problems during recovery for the procedure that I have provided informed consent.  Date  Time: 11/13/2023 12:44 PM  Description of procedure  Start Time: 1324 hrs  Local Anesthesia: Once the patient was positioned, prepped, and time-out was completed. The target area was identified located. The skin was marked with an approved surgical skin marker. Once marked, the skin (epidermis, dermis, and hypodermis), and deeper tissues (fat, connective tissue and muscle) were infiltrated with a small amount of a short-acting local anesthetic, loaded on a 10cc syringe with a 25G, 1.5-in  Needle. An appropriate amount of time was allowed for local anesthetics to take effect before proceeding to the next step. Local Anesthetic: Lidocaine 1-2% The unused portion of the local anesthetic was discarded in the proper designated containers. Safety Precautions: Aspiration looking for blood return was conducted prior to all injections. At no point did I inject any substances, as a needle was being advanced. Before injecting, the patient was told to immediately notify me if she was experiencing any new onset of "ringing in the ears, or metallic taste in the mouth". No attempts were made at seeking any paresthesias. Safe injection practices and needle disposal  techniques used. Medications properly checked for expiration dates. SDV (single dose vial) medications used. After the completion of the procedure, all disposable equipment used was discarded in the proper designated medical waste containers.  Technical description: Protocol guidelines were followed. After positioning, the target area was identified and prepped in the usual manner. Skin & deeper tissues infiltrated with local anesthetic. Appropriate amount of time allowed to pass for local anesthetics to take effect. Proper needle placement secured. Once satisfactory needle placement was confirmed, I proceeded to inject the desired solution in slow, incremental fashion, intermittently assessing for discomfort or any signs of abnormal or undesired spread of substance. Once completed, the needle was removed and disposed of, as per hospital protocols. The area was cleaned, making sure to leave some of the prepping solution back to take advantage of its long term bactericidal properties.  Aspiration:  Negative            Vitals:   11/13/23 1242  BP: 120/79  Pulse: 62  Resp: 14  Temp: (!) 97.1 F (36.2 C)  TempSrc: Temporal  SpO2: 95%  Weight: 159 lb (72.1 kg)  Height: 5\' 3"  (1.6 m)    End Time: 1335 hrs  Imaging guidance  Imaging-assisted Technique: None required. Indication(s): N/A Exposure Time: N/A Contrast: None Fluoroscopic Guidance: N/A Ultrasound Guidance: N/A Interpretation: N/A  Post-op assessment  Post-procedure Vital Signs:  Pulse/HCG Rate: 62  Temp: (!) 97.1 F (36.2 C) Resp: 14 BP: 120/79 SpO2: 95 %  EBL: None  Complications: No immediate post-treatment complications observed by team, or reported by patient.  Note: The patient tolerated the entire procedure well. A repeat set of vitals were taken after the procedure and the patient was kept under observation following institutional policy, for this type of procedure. Post-procedural neurological assessment  was performed, showing return to baseline, prior to discharge. The patient was provided with post-procedure  discharge instructions, including a section on how to identify potential problems. Should any problems arise concerning this procedure, the patient was given instructions to immediately contact us, at any time, without hesitation. In any case, we plan to contact the patient by telephone for a follow-up status report regarding this interventional procedure.  Comments:  No additional relevant information.  Plan of care  Assessment and Plan    Tricompartmental Osteoarthritis of the Right Knee   She has chronic tricompartmental osteoarthritis affecting the lateral, medial, and patellofemoral compartments of the right knee, with symptoms including pain on the inside, below the patella, and on the outside of the knee. An MRI from January 2023 revealed fluid in the knee and osteoarthritis. The severity of the condition has been causing significant discomfort. After discussing the risks (infection, bleeding, temporary increase in pain), benefits (pain relief, reduced inflammation), and alternatives (physical therapy, oral medications, knee replacement), she expressed a preference for injection therapy, understanding the procedure fully. We will administer extended-release triamcinolone (Xelureta) injection into the knee joint, Depo-Medrol injection into Hoffa's pad and the bursa, and aspirate fluid from the knee joint. A new MRI of the right knee is ordered, with a follow-up in two weeks to assess progress.  Hemarthrosis of the Right Knee   She presents with old blood in the knee joint, likely causing irritation and pain. Approximately 3 mL of dark blood that did not clot was aspirated, suggesting it has been present for some time. We will aspirate fluid from the knee joint.  Hoffa's Fat Pad Syndrome   She is experiencing swelling and pain below the patella due to inflammation of Hoffa's fat pad,  contributing to her overall knee pain and discomfort. We will administer a Depo-Medrol injection into Hoffa's pad.  Right Knee Bursitis   She has swelling and tenderness on the medial aspect of the knee, indicative of bursitis, which is contributing to her overall knee pain and discomfort. We will administer a Depo-Medrol injection into the bursa.  Follow-up   A follow-up in two weeks is scheduled to assess progress, along with an order for a new MRI of the right knee.      Chronic Opioid Analgesic:  Oxycodone IR 5 mg, 1 tab PO q 8 hrs (15 mg/day of oxycodone) MME/day: 22.5 mg/day.   Medications administered: We administered methylPREDNISolone acetate, ropivacaine (PF) 2 mg/mL (0.2%), lidocaine, pentafluoroprop-tetrafluoroeth, and Triamcinolone Acetonide.  Follow-up plan:   Return in about 2 weeks (around 11/27/2023) for (Face2F), (PPE).      Interventional Therapies  Risk Factors  Considerations  Medical Comorbidities:  Parkinson's disease  Hx right breast cancer  OIC  SOB  CKD Stage 3b  GERD  depression  osteopenia/osteoporosis  PSVT  Memory impairment     Planned  Pending:   Therapeutic right IA Zilretta knee injection #3 + Pes anserine bursa + Hoffa's pad inj. #1 (11/13/2023)    Under consideration:  Therapeutic right sided suprascapular nerve RFA #1    Completed:   Diagnostic/therapeutic right suprascapular NB x2 (10/16/2023) (100/100/75 x 5 days/0) Therapeutic right trapezius TPI x4 (07/17/2023) (100/100/0/0)  Therapeutic right thoracic TPI x2 (10/17/2018) (DKFUA)  Therapeutic right QL & ESM TPI x1 (02/12/2018)   Diagnostic right SI joint Blk x3 (12/21/2020)  (DKFUA)  Therapeutic right SI joint RFA x4 (03/01/2023) (100/100/70/70)   Therapeutic right lumbar facet MBB (FCT: L3-4,L4-5,L5-S1) x5 (08/09/2021) (100/100/50)  Therapeutic right lumbar facet MBB (FCT: L4-5, L5-S1) x7 (11/01/2023) (100/100/70/R:70)  Therapeutic right lumbar facet MBB (FCT: L5-S1)  x1  (08/29/2022) (100/100/100/0)  Therapeutic right lumbar facet RFA x4 (03/01/2023) (100/100/70/70)   Therapeutic bilateral L5 and L2 TFESI x1 (07/03/2023) (100/90/50/L:100R:20))  Therapeutic right L3 and L4 TFESI x1 (07/27/2022) (100/100/75/50)  Therapeutic right L5 TFESI x4 (02/14/2022) (100/100/70/75)  Therapeutic right S1 TFESI x1 (11/04/2020) (100/100/20/<50)   Therapeutic right L3-4 LESI x2 (11/04/2020) (100/100/20/<50)  Therapeutic right L4-5 LESI x2 (03/30/2022) (100/100/75/75)  Therapeutic right L5-S1 LESI x2 (12/04/2019) (100/100/50/75)  Therapeutic midline L2-3 LESI x1 (10/04/2017) (100/100/85/>50)   Diagnostic bilateral superficial trochanteric bursa injec. x1 (04/30/2018) (DKFUA)  Diagnostic right IA hip injection x1 (02/13/2019) (DKFUA)   Diagnostic/therapeutic right IA steroid knee inj. x5 (07/31/2023) (100/100/100/80)  Therapeutic right (Zilretta) knee injection x3 (11/13/2023)  Therapeutic right (Monovisc) knee injection x1 (06/07/2021) (100/100/100/75)   Therapeutic left thoracic TPI x1 (10/17/2018) (DKFUA)  Diagnostic left SI joint Blk x2 (04/12/2023) (100/90/20/<25)   Therapeutic left lumbar facet MBB (L2-S1) (FCT: L3-4,L4-5,L5-S1) x5 (08/09/2021) (100/100/50)  Therapeutic left lumbar facet MBB (FCT: L4-5, L5-S1) x7 (11/01/2023) (100/100/70/L: 85)  Therapeutic left lumbar facet MBB (L4-S1) (FCT: L5-S1) x1 (08/29/2022) (100/100/100/100)  Therapeutic left lumbar facet RFA x2 (11/30/2022) (100/60/90/>75)    Therapeutic  Palliative (PRN) options:   Do not schedule any procedures without a proper preprocedure evaluation.   Completed by other providers:   Therapeutic right knee (ORTHOVISC) inj. x3 (05/18/2023, 05/23/2023, 05/30/2023) by Aram Candela, MD [facility] PMR) Therapeutic left knee (Kenalog) inj. x1 (08/25/2020) by Novella Olive, MD [facility])   Pharmacotherapy  Nonopioids transfer 12/21/2020: Gabapentin      Recent Visits Date Type Provider Dept  11/12/23  Office Visit [name], MD [facility]  11/01/23 Procedure visit [name], MD [facility]  10/16/23 Procedure visit [name], MD [facility]  09/19/23 Office Visit [name], MD [facility]  Showing recent visits within past 90 days and meeting all other requirements Today's Visits Date Type Provider Dept  11/13/23 Procedure visit [name], MD [facility]  Showing today's visits and meeting all other requirements Future Appointments Date Type Provider Dept  11/28/23 Appointment [name], MD [facility]  02/11/24 Appointment [name], MD [facility]  Showing future appointments within next 90 days and meeting all other requirements   Disposition: Discharge home  Discharge (Date  Time): 11/13/2023; 1340 hrs.   Primary Care Physician: Wanda [place], MD Location: [facility] Note by: Oswaldo Done, MD Date: 11/13/2023; Time: 6:25 PM  DISCLAIMER: Medicine is not an Visual merchandiser. It has no guarantees or warranties. The decision to proceed with this intervention was based on the information collected from the patient. Conclusions were drawn from the patient's questionnaire, interview, and examination. Because information was provided in large part by the patient, it cannot be guaranteed that it has not been purposely or unconsciously manipulated or altered. Every effort has been made to obtain as much accurate, relevant, available data as possible. Always take into account that the treatment will also be dependent on availability of resources and existing treatment guidelines, considered by other Pain Management Specialists as being common knowledge and practice, at the time of the intervention. It is also important to point out that variation in procedural techniques and pharmacological choices are the acceptable norm. For  Medico-Legal review purposes, the indications, contraindications, technique, and results of the these procedures should only be evaluated, judged and interpreted by a Board-Certified Interventional Pain Specialist with extensive familiarity and expertise in the same exact  procedure and technique.

## 2023-11-14 ENCOUNTER — Telehealth: Payer: Self-pay

## 2023-11-14 NOTE — Telephone Encounter (Signed)
Post procedure follow up.  LM 

## 2023-11-20 ENCOUNTER — Ambulatory Visit: Payer: Medicare HMO | Admitting: Pain Medicine

## 2023-11-20 DIAGNOSIS — N39498 Other specified urinary incontinence: Secondary | ICD-10-CM | POA: Diagnosis not present

## 2023-11-20 DIAGNOSIS — I10 Essential (primary) hypertension: Secondary | ICD-10-CM | POA: Diagnosis not present

## 2023-11-20 DIAGNOSIS — N1832 Chronic kidney disease, stage 3b: Secondary | ICD-10-CM | POA: Diagnosis not present

## 2023-11-20 DIAGNOSIS — E785 Hyperlipidemia, unspecified: Secondary | ICD-10-CM | POA: Diagnosis not present

## 2023-11-20 DIAGNOSIS — E871 Hypo-osmolality and hyponatremia: Secondary | ICD-10-CM | POA: Diagnosis not present

## 2023-11-20 DIAGNOSIS — E876 Hypokalemia: Secondary | ICD-10-CM | POA: Diagnosis not present

## 2023-11-20 DIAGNOSIS — K219 Gastro-esophageal reflux disease without esophagitis: Secondary | ICD-10-CM | POA: Diagnosis not present

## 2023-11-20 DIAGNOSIS — D631 Anemia in chronic kidney disease: Secondary | ICD-10-CM | POA: Diagnosis not present

## 2023-11-20 DIAGNOSIS — M549 Dorsalgia, unspecified: Secondary | ICD-10-CM | POA: Diagnosis not present

## 2023-11-23 ENCOUNTER — Ambulatory Visit
Admission: RE | Admit: 2023-11-23 | Discharge: 2023-11-23 | Disposition: A | Discharge status: Home / Self Care | Payer: Medicare HMO | Source: Ambulatory Visit | Attending: Pain Medicine | Admitting: Pain Medicine

## 2023-11-23 DIAGNOSIS — M25061 Hemarthrosis, right knee: Secondary | ICD-10-CM | POA: Diagnosis not present

## 2023-11-23 DIAGNOSIS — M7050 Other bursitis of knee, unspecified knee: Secondary | ICD-10-CM | POA: Diagnosis not present

## 2023-11-23 DIAGNOSIS — M25561 Pain in right knee: Secondary | ICD-10-CM

## 2023-11-23 DIAGNOSIS — E8889 Other specified metabolic disorders: Secondary | ICD-10-CM | POA: Diagnosis not present

## 2023-11-23 DIAGNOSIS — M25461 Effusion, right knee: Secondary | ICD-10-CM | POA: Diagnosis not present

## 2023-11-23 DIAGNOSIS — G8929 Other chronic pain: Secondary | ICD-10-CM | POA: Diagnosis not present

## 2023-11-23 DIAGNOSIS — M7051 Other bursitis of knee, right knee: Secondary | ICD-10-CM

## 2023-11-23 DIAGNOSIS — M1711 Unilateral primary osteoarthritis, right knee: Secondary | ICD-10-CM | POA: Diagnosis not present

## 2023-11-23 DIAGNOSIS — M948X6 Other specified disorders of cartilage, lower leg: Secondary | ICD-10-CM | POA: Diagnosis not present

## 2023-11-23 DIAGNOSIS — R936 Abnormal findings on diagnostic imaging of limbs: Secondary | ICD-10-CM

## 2023-11-27 NOTE — Progress Notes (Unsigned)
PROVIDER NOTE: Information contained herein reflects review and annotations entered in association with encounter. Interpretation of such information and data should be left to medically-trained personnel. Information provided to patient can be located elsewhere in the medical record under "Patient Instructions". Document created using STT-dictation technology, any transcriptional errors that may result from process are unintentional.    Patient: Wanda Martinez Number  Service Category: E/M  Provider: Oswaldo Done, MD  DOB: [birth year]  DOS: 11/28/2023  Referring Provider: Dale Holley, MD  MRN: [id number]  Specialty: Interventional Pain Management  PCP: Dale Oberlin, MD  Type: Established Patient  Setting: Ambulatory outpatient    Location: Office  Delivery: Face-to-face     HPI  Wanda Martinez, a 88 y.o. year old female, is here today because of her No primary diagnosis found.. Ms. Sybert primary complain today is No chief complaint on file.  Pertinent problems: Wanda Martinez has Headache; Parkinson's disease (HCC); Chronic low back pain (1ry area of Pain) (Bilateral) (R>L) w/o sciatica; Lumbar spondylosis; Chronic hip pain (Bilateral); Chronic neck pain; Cervical spondylosis; Chronic cervical radicular pain (Right); Diffuse myofascial pain syndrome; Neurogenic pain; Chronic upper back pain (Right); Myofascial pain syndrome (Right) (cervicothoracic); Lumbar facet syndrome (Bilateral) (R>L); History of breast cancer; Cervical facet hypertrophy; Cervical facet syndrome (HCC); Chronic shoulder pain (Right); Chronic pain syndrome; Chronic sacroiliac joint pain (Left); Chronic sacroiliac joint pain (Right); 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, L4-5, L5-S1) (Right); Lumbar spinal stenosis (with neurogenic claudication) (L3-4); Chronic lower extremity pain (Right); Chronic lumbar radicular pain (Right); Trochanteric bursitis of hip (Bilateral); Spondylosis without myelopathy or radiculopathy,  lumbosacral region; Trigger point with back pain (Right); DDD (degenerative disc disease), lumbosacral; Chronic upper extremity pain (Right); Chronic thoracic back pain (Bilateral) (L>R); Trigger point of thoracic region (Bilateral) (L>R); Other specified dorsopathies, sacral and sacrococcygeal region; Sacroiliac joint dysfunction (Right); Osteoarthritis of sacroiliac joint (Right); Somatic dysfunction of sacroiliac joint (Right); Chronic musculoskeletal pain; Facial pain; Chronic hip pain (Right); Osteoarthritis of hip (Right); Left ear pain; 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HCC); Abnormal MRI, lumbar spine (08/23/2022); Osteoarthritis involving multiple joints; Other spondylosis, sacral and sacrococcygeal region; Lumbar facet hypertrophy (Multilevel) (Bilateral); Chronic knee pain (Right); Osteoarthritis of knee (Right); Trigger point of shoulder region (Right); Osteoarthritis of AC (acromioclavicular) joint (Right); Osteoarthritis of shoulder (Right); Unspecified injury of muscle(s) and tendon(s) of the rotator cuff of shoulder, sequela (Right); Cervicalgia; DDD (degenerative disc disease), lumbar; Lumbar radicular pain (Bilateral); Lumbar foraminal stenosis (Right: L3-4, L4-5, L5-S1) (Left: L2-3); Abnormal MRI, hip (Right) (05/04/2022); Abnormal MRI, knee (Right) (01/04/2022); Tricompartment osteoarthritis of knee (Right); Chronic low back pain (Right) w/o sciatica; Hoffa's fat pad disease (HCC) (Right); Hoffa's pad syndrome (HCC) (Right); Chronic low back pain (Right) w/ sciatica (Right); Lower extremity weakness (Right); Chronic radicular pain of lower back; Chronic sciatica (Right); Lumbosacral radiculopathy at L5 (Right); Low back pain of over 3 months duration; Personal history of breast cancer; Pes anserine bursitis (Right); Infrapatellar bursitis of knee (Right); Traumatic ecchymosis of right lower leg; Lumbar facet joint pain; Medial knee pain (Right); Chronic patellofemoral pain of knee (Right);  Patellofemoral joint pain (Right); and Knee hemarthrosis (Right) on their pertinent problem list. Pain Assessment: Severity of   is reported as a  /10. Location:    / . Onset:  . Quality:  . Timing:  . Modifying factor(s):  Marland Kitchen Vitals:  vitals were not taken for this visit.  BMI: Estimated body mass index is 28.17 kg/m as calculated from the following:   Height as of 11/13/23: 5'  3" (1.6 m).   Weight as of 11/13/23: 159 lb (72.1 kg). Last encounter: 11/12/2023. Last procedure: 11/13/2023.  Reason for encounter:  *** . *** Discussed the use of AI scribe software for clinical note transcription with the patient, who gave verbal consent to proceed.  History of Present Illness           Pharmacotherapy Assessment  Analgesic: Oxycodone IR 5 mg, 1 tab PO q 8 hrs (15 mg/day of oxycodone) MME/day: 22.5 mg/day.   Monitoring: [place] PMP: PDMP reviewed during this encounter.       Pharmacotherapy: No side-effects or adverse reactions reported. Compliance: No problems identified. Effectiveness: Clinically acceptable.  No notes on file  No results found for: "CBDTHCR" No results found for: "D8THCCBX" No results found for: "D9THCCBX"  UDS:  Summary  Date Value Ref Range Status  07/31/2023 Note  Final    Comment:    ==================================================================== [employer] (MW) ==================================================================== Test                             Result       Flag       Units  Drug Present and Declared for Prescription Verification   Oxycodone                      839          EXPECTED   ng/mg creat   Noroxycodone                   3412         EXPECTED   ng/mg creat    Sources of oxycodone include scheduled prescription medications.    Noroxycodone is an expected metabolite of oxycodone.  Drug Present not Declared for Prescription Verification   Hydrocodone                    340          UNEXPECTED ng/mg creat    Dihydrocodeine                 32           UNEXPECTED ng/mg creat   Norhydrocodone                 403          UNEXPECTED ng/mg creat    Sources of hydrocodone include scheduled prescription medications.    Dihydrocodeine and norhydrocodone are expected metabolites of    hydrocodone. Dihydrocodeine is also available as a scheduled    prescription medication.  ==================================================================== Test                      Result    Flag   Units      Ref Range   Creatinine              208              mg/dL      >=16 ==================================================================== Declared Medications:  The flagging and interpretation on this report are based on the  following declared medications.  Unexpected results may arise from  inaccuracies in the declared medications.   **Note: The testing scope of this panel includes these medications:   Oxycodone (Roxicodone)   **Note: The testing scope of this panel does not include the  following reported medications:   Amlodipine (Norvasc)  Aspirin  Calcium  Chondroitin  Ciprofloxacin (Cipro)  Citalopram (Celexa)  Cyclobenzaprine (Flexeril)  Donepezil (Aricept)  Erythromycin  Gabapentin (Neurontin)  Glucosamine  Hydrocortisone (Anusol-HC)  Letrozole (Femara)  Losartan (Cozaar)  Lubiprostone (Amitiza)  Magnesium (Mag-Ox)  Melatonin  Metoprolol (Toprol)  Mirabegron (Myrbetriq)  Multivitamin  Naloxone (Narcan)  Nitrofurantoin (Macrobid)  Omega-3 Fatty Acids  Pantoprazole (Protonix)  Rosuvastatin (Crestor) ==================================================================== For clinical consultation, please call [phone]. ====================================================================       ROS  Constitutional: Denies any fever or chills Gastrointestinal: No reported hemesis, hematochezia, vomiting, or acute GI distress Musculoskeletal: Denies any acute onset joint  swelling, redness, loss of ROM, or weakness Neurological: No reported episodes of acute onset apraxia, aphasia, dysarthria, agnosia, amnesia, paralysis, loss of coordination, or loss of consciousness  Medication Review  Glucosamine-Chondroitin, Omega-3, amLODipine, aspirin EC, calcium carbonate, citalopram, cyclobenzaprine, donepezil, gabapentin, hydrochlorothiazide, letrozole, losartan, lubiprostone, magnesium oxide, melatonin, metoprolol succinate, mirabegron ER, multivitamin, naloxone, oxyCODONE, pantoprazole, and rosuvastatin  History Review  Allergy: Ms. Zieman is allergic to sulfa antibiotics and vesicare [solifenacin]. Drug: Ms. Hishmeh  reports no history of drug use. Alcohol:  reports no history of alcohol use. Tobacco:  reports that she has never smoked. She has never been exposed to tobacco smoke. She has never used smokeless tobacco. Social: Ms. Triska  reports that she has never smoked. She has never been exposed to tobacco smoke. She has never used smokeless tobacco. She reports that she does not drink alcohol and does not use drugs. Medical:  has a past medical history of Acute postoperative pain (10/25/2017), Anemia, Arm pain (07/26/2015), Arthritis, Arthritis, degenerative (03/26/2014), Back pain (11/01/2013), Bladder infection (06/2018), Breast cancer (HCC), Breast cancer (HCC), CHEST PAIN (04/29/2010), Chronic cystitis, Cystocele (02/22/2013), Cystocele, midline (08/19/2013), Degeneration of intervertebral disc of lumbosacral region (03/26/2014), DYSPNEA (04/29/2010), Enthesopathy of hip (03/26/2014), GERD (gastroesophageal reflux disease), Hiatal hernia, HTN (hypertension), Hypokalemia (06/2018), Hyponatremia (06/2018), LBP (low back pain) (03/26/2014), Neck pain (11/01/2013), Parkinson disease (HCC), Pneumonia (06/2018), Sinusitis (02/07/2015), Skin lesions (07/12/2014), and Urinary incontinence. Surgical: Ms. Bria  has a past surgical history that includes Tonsillectomy and adenoidectomy (1979);  Vesicovaginal fistula closure w/ TAH (1983); Breast surgery (1986); Breast enhancement surgery (1987); Breast implant removal; Breast implant removal (Right, 08/29/2012); Mastectomy (08/2012); Abdominal hysterectomy; Colonoscopy with propofol (N/A, 09/13/2016); Breast biopsy (2013); and Blepharoplasty. Family: family history includes Colon polyps in her father; Diabetes in her father; Parkinson's disease in her mother; Stroke in her father and mother.  Laboratory Chemistry Profile   Renal Lab Results  Component Value Date   BUN 33 (H) 09/26/2023   CREATININE 1.14 (H) 09/26/2023   BCR 20 06/11/2020   GFR 51.23 (L) 09/11/2023   GFRAA >60 07/27/2020   GFRNONAA 47 (L) 09/26/2023    Hepatic Lab Results  Component Value Date   AST 25 09/11/2023   ALT 19 09/11/2023   ALBUMIN 3.8 09/11/2023   ALKPHOS 32 (L) 09/11/2023    Electrolytes Lab Results  Component Value Date   NA 132 (L) 09/26/2023   K 4.2 09/26/2023   CL 101 09/26/2023   CALCIUM 9.4 09/26/2023   MG 2.2 07/17/2018    Bone Lab Results  Component Value Date   VD25OH 66.95 09/26/2022   25OHVITD1 42 07/17/2018   25OHVITD2 <1.0 07/17/2018   25OHVITD3 42 07/17/2018    Inflammation (CRP: Acute Phase) (ESR: Chronic Phase) Lab Results  Component Value Date   CRP 3 07/17/2018   ESRSEDRATE 27 07/17/2018         Note: Above Lab results  reviewed.  Recent Imaging Review  DG PAIN CLINIC C-ARM 1-60 MIN NO REPORT Fluoro was used, but no Radiologist interpretation will be provided.  Please refer to "NOTES" tab for provider progress note. Note: Reviewed        Physical Exam  General appearance: Well nourished, well developed, and well hydrated. In no apparent acute distress Mental status: Alert, oriented x 3 (person, place, & time)       Respiratory: No evidence of acute respiratory distress Eyes: PERLA Vitals: LMP 12/18/1981  BMI: Estimated body mass index is 28.17 kg/m as calculated from the following:   Height as of  11/13/23: 5\' 3"  (1.6 m).   Weight as of 11/13/23: 159 lb (72.1 kg). Ideal: Ideal body weight: 52.4 kg (115 lb 8.3 oz) Adjusted ideal body weight: 60.3 kg (132 lb 14.6 oz)  Assessment   Diagnosis Status  No diagnosis found. Controlled Controlled Controlled   Updated Problems: No problems updated.  Plan of Care  Problem-specific:  Assessment and Plan            Ms. Lynice Heldman has a current medication list which includes the following long-term medication(s): amlodipine, calcium carbonate, citalopram, citalopram, cyclobenzaprine, donepezil, gabapentin, losartan, magnesium oxide, metoprolol succinate, mirabegron er, oxycodone, [START ON 12/14/2023] oxycodone, [START ON 01/13/2024] oxycodone, pantoprazole, and rosuvastatin.  Pharmacotherapy (Medications Ordered): No orders of the defined types were placed in this encounter.  Orders:  No orders of the defined types were placed in this encounter.  Follow-up plan:   No follow-ups on file.      Interventional Therapies  Risk Factors  Considerations  Medical Comorbidities:  Parkinson's disease  Hx right breast cancer  OIC  SOB  CKD Stage 3b  GERD  depression  osteopenia/osteoporosis  PSVT  Memory impairment     Planned  Pending:   Therapeutic right IA Zilretta knee injection #3 + Pes anserine bursa + Hoffa's pad inj. #1 (11/13/2023)    Under consideration:  Therapeutic right sided suprascapular nerve RFA #1    Completed:   Diagnostic/therapeutic right suprascapular NB x2 (10/16/2023) (100/100/75 x 5 days/0) Therapeutic right trapezius TPI x4 (07/17/2023) (100/100/0/0)  Therapeutic right thoracic TPI x2 (10/17/2018) (DKFUA)  Therapeutic right QL & ESM TPI x1 (02/12/2018)   Diagnostic right SI joint Blk x3 (12/21/2020)  (DKFUA)  Therapeutic right SI joint RFA x4 (03/01/2023) (100/100/70/70)   Therapeutic right lumbar facet MBB (FCT: L3-4,L4-5,L5-S1) x5 (08/09/2021) (100/100/50)  Therapeutic right lumbar  facet MBB (FCT: L4-5, L5-S1) x7 (11/01/2023) (100/100/70/R:70)  Therapeutic right lumbar facet MBB (FCT: L5-S1) x1 (08/29/2022) (100/100/100/0)  Therapeutic right lumbar facet RFA x4 (03/01/2023) (100/100/70/70)   Therapeutic bilateral L5 and L2 TFESI x1 (07/03/2023) (100/90/50/L:100R:20))  Therapeutic right L3 and L4 TFESI x1 (07/27/2022) (100/100/75/50)  Therapeutic right L5 TFESI x4 (02/14/2022) (100/100/70/75)  Therapeutic right S1 TFESI x1 (11/04/2020) (100/100/20/<50)   Therapeutic right L3-4 LESI x2 (11/04/2020) (100/100/20/<50)  Therapeutic right L4-5 LESI x2 (03/30/2022) (100/100/75/75)  Therapeutic right L5-S1 LESI x2 (12/04/2019) (100/100/50/75)  Therapeutic midline L2-3 LESI x1 (10/04/2017) (100/100/85/>50)   Diagnostic bilateral superficial trochanteric bursa injec. x1 (04/30/2018) (DKFUA)  Diagnostic right IA hip injection x1 (02/13/2019) (DKFUA)   Diagnostic/therapeutic right IA steroid knee inj. x5 (07/31/2023) (100/100/100/80)  Therapeutic right (Zilretta) knee injection x3 (11/13/2023)  Therapeutic right (Monovisc) knee injection x1 (06/07/2021) (100/100/100/75)   Therapeutic left thoracic TPI x1 (10/17/2018) (DKFUA)  Diagnostic left SI joint Blk x2 (04/12/2023) (100/90/20/<25)   Therapeutic left lumbar facet MBB (L2-S1) (FCT: L3-4,L4-5,L5-S1) x5 (08/09/2021) (100/100/50)  Therapeutic left lumbar facet MBB (FCT: L4-5, L5-S1) x7 (11/01/2023) (100/100/70/L: 85)  Therapeutic left lumbar facet MBB (L4-S1) (FCT: L5-S1) x1 (08/29/2022) (100/100/100/100)  Therapeutic left lumbar facet RFA x2 (11/30/2022) (100/60/90/>75)    Therapeutic  Palliative (PRN) options:   Do not schedule any procedures without a proper preprocedure evaluation.   Completed by other providers:   Therapeutic right knee (ORTHOVISC) inj. x3 (05/18/2023, 05/23/2023, 05/30/2023) by Aram Candela, MD [facility]) Therapeutic left knee (Kenalog) inj. x1 (08/25/2020) by Novella Olive, MD [facility])    Pharmacotherapy  Nonopioids transfer 12/21/2020: Gabapentin        Recent Visits Date Type Provider Dept  11/13/23 Procedure visit [name], MD [facility]  11/12/23 Office Visit [name], MD [facility]  11/01/23 Procedure visit [name], MD [facility]  10/16/23 Procedure visit [name], MD [facility]  09/19/23 Office Visit [name], MD [facility]  Showing recent visits within past 90 days and meeting all other requirements Future Appointments Date Type Provider Dept  11/28/23 Appointment [name], MD [facility]  02/11/24 Appointment [name], MD [facility]  Showing future appointments within next 90 days and meeting all other requirements  I discussed the assessment and treatment plan with the patient. The patient was provided an opportunity to ask questions and all were answered. The patient agreed with the plan and demonstrated an understanding of the instructions.  Patient advised to call back or seek an in-person evaluation if the symptoms or condition worsens.  Duration of encounter: *** minutes.  Total time on encounter, as per AMA guidelines included both the face-to-face and non-face-to-face time personally spent by the physician and/or other qualified health care professional(s) on the day of the encounter (includes time in activities that require the physician or other qualified health care professional and does not include time in activities normally performed by clinical staff). Physician's time may include the following activities when performed: Preparing to see the patient (e.g., pre-charting review of records, searching for previously ordered imaging, lab work, and nerve conduction tests) Review of prior analgesic pharmacotherapies. Reviewing PMP Interpreting ordered tests (e.g., lab work, imaging, nerve conduction  tests) Performing post-procedure evaluations, including interpretation of diagnostic procedures Obtaining and/or reviewing separately obtained history Performing a medically appropriate examination and/or evaluation Counseling and educating the patient/family/caregiver Ordering medications, tests, or procedures Referring and communicating with other health care professionals (when not separately reported) Documenting clinical information in the electronic or other health record Independently interpreting results (not separately reported) and communicating results to the patient/ family/caregiver Care coordination (not separately reported)  Note by: Oswaldo Done, MD Date: 11/28/2023; Time: 7:44 AM

## 2023-11-28 ENCOUNTER — Encounter: Payer: Self-pay | Admitting: Pain Medicine

## 2023-11-28 ENCOUNTER — Ambulatory Visit: Payer: Medicare HMO | Attending: Pain Medicine | Admitting: Pain Medicine

## 2023-11-28 VITALS — BP 131/77 | Temp 97.3°F | Resp 18 | Ht 63.0 in | Wt 158.0 lb

## 2023-11-28 DIAGNOSIS — M19011 Primary osteoarthritis, right shoulder: Secondary | ICD-10-CM | POA: Diagnosis not present

## 2023-11-28 DIAGNOSIS — M25561 Pain in right knee: Secondary | ICD-10-CM | POA: Diagnosis not present

## 2023-11-28 DIAGNOSIS — G8929 Other chronic pain: Secondary | ICD-10-CM | POA: Diagnosis not present

## 2023-11-28 DIAGNOSIS — R936 Abnormal findings on diagnostic imaging of limbs: Secondary | ICD-10-CM | POA: Diagnosis not present

## 2023-11-28 DIAGNOSIS — M7051 Other bursitis of knee, right knee: Secondary | ICD-10-CM | POA: Diagnosis not present

## 2023-11-28 NOTE — Progress Notes (Signed)
Safety precautions to be maintained throughout the outpatient stay will include: orient to surroundings, keep bed in low position, maintain call bell within reach at all times, provide assistance with transfer out of bed and ambulation.  

## 2023-11-28 NOTE — Patient Instructions (Signed)

## 2023-12-05 ENCOUNTER — Encounter: Payer: Self-pay | Admitting: Internal Medicine

## 2023-12-05 ENCOUNTER — Ambulatory Visit (INDEPENDENT_AMBULATORY_CARE_PROVIDER_SITE_OTHER): Payer: Medicare HMO | Admitting: Internal Medicine

## 2023-12-05 VITALS — BP 138/82 | HR 56 | Temp 97.5°F | Ht 63.0 in | Wt 162.2 lb

## 2023-12-05 DIAGNOSIS — D649 Anemia, unspecified: Secondary | ICD-10-CM | POA: Diagnosis not present

## 2023-12-05 DIAGNOSIS — K219 Gastro-esophageal reflux disease without esophagitis: Secondary | ICD-10-CM | POA: Diagnosis not present

## 2023-12-05 DIAGNOSIS — I10 Essential (primary) hypertension: Secondary | ICD-10-CM | POA: Diagnosis not present

## 2023-12-05 DIAGNOSIS — E871 Hypo-osmolality and hyponatremia: Secondary | ICD-10-CM | POA: Diagnosis not present

## 2023-12-05 DIAGNOSIS — I700 Atherosclerosis of aorta: Secondary | ICD-10-CM

## 2023-12-05 DIAGNOSIS — J329 Chronic sinusitis, unspecified: Secondary | ICD-10-CM

## 2023-12-05 DIAGNOSIS — E7800 Pure hypercholesterolemia, unspecified: Secondary | ICD-10-CM | POA: Diagnosis not present

## 2023-12-05 DIAGNOSIS — N1831 Chronic kidney disease, stage 3a: Secondary | ICD-10-CM

## 2023-12-05 DIAGNOSIS — R739 Hyperglycemia, unspecified: Secondary | ICD-10-CM | POA: Diagnosis not present

## 2023-12-05 DIAGNOSIS — R2681 Unsteadiness on feet: Secondary | ICD-10-CM | POA: Insufficient documentation

## 2023-12-05 DIAGNOSIS — M810 Age-related osteoporosis without current pathological fracture: Secondary | ICD-10-CM

## 2023-12-05 DIAGNOSIS — F320 Major depressive disorder, single episode, mild: Secondary | ICD-10-CM

## 2023-12-05 LAB — BASIC METABOLIC PANEL
BUN: 30 mg/dL — ABNORMAL HIGH (ref 6–23)
CO2: 29 meq/L (ref 19–32)
Calcium: 9 mg/dL (ref 8.4–10.5)
Chloride: 100 meq/L (ref 96–112)
Creatinine, Ser: 0.93 mg/dL (ref 0.40–1.20)
GFR: 55.8 mL/min — ABNORMAL LOW (ref >60.00)
Glucose, Bld: 86 mg/dL (ref 70–99)
Potassium: 4.3 meq/L (ref 3.5–5.1)
Sodium: 134 meq/L — ABNORMAL LOW (ref 135–145)

## 2023-12-05 MED ORDER — AMOXICILLIN 875 MG PO TABS: 875.0000 mg | ORAL_TABLET | Freq: Two times a day (BID) | ORAL | 0 refills | Status: AC

## 2023-12-05 NOTE — Progress Notes (Unsigned)
PROVIDER NOTE: Interpretation of information contained herein should be left to medically-trained personnel. Specific patient instructions are provided elsewhere under "Patient Instructions" section of medical record. This document was created in part using STT-dictation technology, any transcriptional errors that may result from this process are unintentional.  Patient: Wanda Martinez Type: Established DOB: 1935-08-28 MRN: [id number] PCP: Dale [place], MD  Service: Procedure DOS: 12/06/2023 Setting: Ambulatory Location: Ambulatory outpatient facility Delivery: Face-to-face Provider: Oswaldo Done, MD Specialty: Interventional Pain Management Specialty designation: 09 Location: Outpatient facility Ref. Prov.: Dale [place], MD       Interventional Therapy   Primary Reason for Visit: Interventional Pain Management Treatment. CC: No chief complaint on file.    Procedure:          Anesthesia, Analgesia, Anxiolysis:  Type: Diagnostic Pes anserine Knee Bursa Injection          Region: Lateral infrapatellar Knee Region Level: Knee Joint Laterality: Right knee  Type: Local Anesthesia Local Anesthetic: Lidocaine 1-2% Sedation: None  Indication(s):  Analgesia Route: Infiltration (/IM) IV Access: N/A   Position: Sitting   1. Chronic knee pain (Right)   2. Hoffa's fat pad disease (HCC) (Right)   3. Hoffa's pad syndrome (HCC) (Right)   4. Infrapatellar bursitis of knee (Right)   5. Knee hemarthrosis (Right)   6. Osteoarthritis of knee (Right)   7. Patellofemoral joint pain (Right)   8. Pes anserine bursitis (Right)   9. Pes anserinus bursitis of right knee    NAS-11 Pain score:   Pre-procedure:  /10   Post-procedure:  /10     H&P (Pre-op Assessment):  Wanda Martinez is a 88 y.o. (year old), female patient, seen today for interventional treatment. She  has a past surgical history that includes Tonsillectomy and adenoidectomy (1979); Vesicovaginal fistula closure w/  TAH (1983); Breast surgery (1986); Breast enhancement surgery (1987); Breast implant removal; Breast implant removal (Right, 08/29/2012); Mastectomy (08/2012); Abdominal hysterectomy; Colonoscopy with propofol (N/A, 09/13/2016); Breast biopsy (2013); and Blepharoplasty. Wanda Martinez has a current medication list which includes the following prescription(s): amlodipine, aspirin ec, calcium carbonate, ciprofloxacin, citalopram, citalopram, cyclobenzaprine, donepezil, gabapentin, glucosamine-chondroitin, hydrochlorothiazide, letrozole, losartan, lubiprostone, magnesium oxide, melatonin, metoprolol succinate, mirabegron er, multivitamin, naloxone, nitrofurantoin (macrocrystal-monohydrate), omega-3, oxycodone, [START ON 12/14/2023] oxycodone, [START ON 01/13/2024] oxycodone, pantoprazole, and rosuvastatin. Her primarily concern today is the No chief complaint on file.  Initial Vital Signs:  Pulse/HCG Rate:    Temp:   Resp:   BP:   SpO2:    BMI: Estimated body mass index is 28.73 kg/m as calculated from the following:   Height as of 12/05/23: 5\' 3"  (1.6 m).   Weight as of 12/05/23: 162 lb 3.2 oz (73.6 kg).  Risk Assessment: Allergies: Reviewed. She is allergic to sulfa antibiotics and vesicare [solifenacin].  Allergy Precautions: None required Coagulopathies: Reviewed. None identified.  Blood-thinner therapy: None at this time Active Infection(s): Reviewed. None identified. Wanda Martinez is afebrile  Site Confirmation: Wanda Martinez was asked to confirm the procedure and laterality before marking the site Procedure checklist: Completed Consent: Before the procedure and under the influence of no sedative(s), amnesic(s), or anxiolytics, the patient was informed of the treatment options, risks and possible complications. To fulfill our ethical and legal obligations, as recommended by the American Medical Association's Code of Ethics, I have informed the patient of my clinical impression; the nature and purpose  of the treatment or procedure; the risks, benefits, and possible complications of the intervention; the alternatives, including doing nothing; the risk(s)  and benefit(s) of the alternative treatment(s) or procedure(s); and the risk(s) and benefit(s) of doing nothing. The patient was provided information about the general risks and possible complications associated with the procedure. These may include, but are not limited to: failure to achieve desired goals, infection, bleeding, organ or nerve damage, allergic reactions, paralysis, and death. In addition, the patient was informed of those risks and complications associated to the procedure, such as failure to decrease pain; infection; bleeding; organ or nerve damage with subsequent damage to sensory, motor, and/or autonomic systems, resulting in permanent pain, numbness, and/or weakness of one or several areas of the body; allergic reactions; (i.e.: anaphylactic reaction); and/or death. Furthermore, the patient was informed of those risks and complications associated with the medications. These include, but are not limited to: allergic reactions (i.e.: anaphylactic or anaphylactoid reaction(s)); adrenal axis suppression; blood sugar elevation that in diabetics may result in ketoacidosis or comma; water retention that in patients with history of congestive heart failure may result in shortness of breath, pulmonary edema, and decompensation with resultant heart failure; weight gain; swelling or edema; medication-induced neural toxicity; particulate matter embolism and blood vessel occlusion with resultant organ, and/or nervous system infarction; and/or aseptic necrosis of one or more joints. Finally, the patient was informed that Medicine is not an exact science; therefore, there is also the possibility of unforeseen or unpredictable risks and/or possible complications that may result in a catastrophic outcome. The patient indicated having understood very  clearly. We have given the patient no guarantees and we have made no promises. Enough time was given to the patient to ask questions, all of which were answered to the patient's satisfaction. Wanda Martinez has indicated that she wanted to continue with the procedure. Attestation: I, the ordering provider, attest that I have discussed with the patient the benefits, risks, side-effects, alternatives, likelihood of achieving goals, and potential problems during recovery for the procedure that I have provided informed consent. Date  Time: {CHL [facility]-PAIN TIME CHOICES:21018001}  Pre-Procedure Preparation:  Monitoring: As per clinic protocol. Respiration, ETCO2, SpO2, BP, heart rate and rhythm monitor placed and checked for adequate function Safety Precautions: Patient was assessed for positional comfort and pressure points before starting the procedure. Time-out: I initiated and conducted the "Time-out" before starting the procedure, as per protocol. The patient was asked to participate by confirming the accuracy of the "Time Out" information. Verification of the correct person, site, and procedure were performed and confirmed by me, the nursing staff, and the patient. "Time-out" conducted as per Joint Commission's Universal Protocol (UP.01.01.01). Time:   Start Time:   hrs.  Description of Procedure:          Target Area: Knee Joint Approach: Just above the Lateral tibial plateau, lateral to the infrapatellar tendon. Area Prepped: Entire knee area, from the mid-thigh to the mid-shin. ChloraPrep (2% chlorhexidine gluconate and 70% isopropyl alcohol) Safety Precautions: Aspiration looking for blood return was conducted prior to all injections. At no point did we inject any substances, as a needle was being advanced. No attempts were made at seeking any paresthesias. Safe injection practices and needle disposal techniques used. Medications properly checked for expiration dates. SDV (single dose vial)  medications used. Description of the Procedure: Protocol guidelines were followed. The patient was placed in position over the fluoroscopy table. The target area was identified and the area prepped in the usual manner. Skin & deeper tissues infiltrated with local anesthetic. Appropriate amount of time allowed to pass for local anesthetics to take  effect. The procedure needles were then advanced to the target area. Proper needle placement secured. Negative aspiration confirmed. Solution injected in intermittent fashion, asking for systemic symptoms every 0.5cc of injectate. The needles were then removed and the area cleansed, making sure to leave some of the prepping solution back to take advantage of its long term bactericidal properties.        There were no vitals filed for this visit.  Start Time:   hrs. End Time:   hrs. Materials:  Needle(s) Type: Regular needle Gauge: 25G Length: 1.5-in Medication(s): Please see orders for medications and dosing details.  Type of Imaging Technique: None used Indication(s): N/A Exposure Time: No patient exposure Contrast: None used. Fluoroscopic Guidance: N/A Ultrasound Guidance: N/A Interpretation: N/A  Antibiotic Prophylaxis:   Anti-infectives (From admission, onward)    None      Indication(s): None identified  Post-operative Assessment:  Post-procedure Vital Signs:  Pulse/HCG Rate:    Temp:   Resp:   BP:   SpO2:    EBL: None  Complications: No immediate post-treatment complications observed by team, or reported by patient.  Note: The patient tolerated the entire procedure well. A repeat set of vitals were taken after the procedure and the patient was kept under observation following institutional policy, for this type of procedure. Post-procedural neurological assessment was performed, showing return to baseline, prior to discharge. The patient was provided with post-procedure discharge instructions, including a section on how to  identify potential problems. Should any problems arise concerning this procedure, the patient was given instructions to immediately contact us, at any time, without hesitation. In any case, we plan to contact the patient by telephone for a follow-up status report regarding this interventional procedure.  Comments:  No additional relevant information.  Plan of Care (POC)  Orders:  No orders of the defined types were placed in this encounter.  Chronic Opioid Analgesic:   Oxycodone IR 5 mg, 1 tab PO q 8 hrs (15 mg/day of oxycodone) MME/day: 22.5 mg/day.   Medications ordered for procedure: No orders of the defined types were placed in this encounter.  Medications administered: Anda T. Line had no medications administered during this visit.  See the medical record for exact dosing, route, and time of administration.  Follow-up plan:   No follow-ups on file.       Interventional Therapies  Risk Factors  Considerations  Medical Comorbidities:  Parkinson's disease  Hx right breast cancer  OIC  SOB  CKD Stage 3b  GERD  depression  osteopenia/osteoporosis  PSVT  Memory impairment     Planned  Pending:   Therapeutic right IA Zilretta knee injection #3 + Pes anserine bursa + Hoffa's pad inj. #1 (11/13/2023)    Under consideration:  Therapeutic right sided suprascapular nerve RFA #1    Completed:   Diagnostic/therapeutic right suprascapular NB x2 (10/16/2023) (100/100/75 x 5 days/0) Therapeutic right trapezius TPI x4 (07/17/2023) (100/100/0/0)  Therapeutic right thoracic TPI x2 (10/17/2018) (DKFUA)  Therapeutic right QL & ESM TPI x1 (02/12/2018)   Diagnostic right SI joint Blk x3 (12/21/2020)  (DKFUA)  Therapeutic right SI joint RFA x4 (03/01/2023) (100/100/70/70)   Therapeutic right lumbar facet MBB (FCT: L3-4,L4-5,L5-S1) x5 (08/09/2021) (100/100/50)  Therapeutic right lumbar facet MBB (FCT: L4-5, L5-S1) x7 (11/01/2023) (100/100/70/R:70)  Therapeutic right lumbar facet  MBB (FCT: L5-S1) x1 (08/29/2022) (100/100/100/0)  Therapeutic right lumbar facet RFA x4 (03/01/2023) (100/100/70/70)   Therapeutic bilateral L5 and L2 TFESI x1 (07/03/2023) (100/90/50/L:100R:20))  Therapeutic right L3 and  L4 TFESI x1 (07/27/2022) (100/100/75/50)  Therapeutic right L5 TFESI x4 (02/14/2022) (100/100/70/75)  Therapeutic right S1 TFESI x1 (11/04/2020) (100/100/20/<50)   Therapeutic right L3-4 LESI x2 (11/04/2020) (100/100/20/<50)  Therapeutic right L4-5 LESI x2 (03/30/2022) (100/100/75/75)  Therapeutic right L5-S1 LESI x2 (12/04/2019) (100/100/50/75)  Therapeutic midline L2-3 LESI x1 (10/04/2017) (100/100/85/>50)   Diagnostic bilateral superficial trochanteric bursa injec. x1 (04/30/2018) (DKFUA)  Diagnostic right IA hip injection x1 (02/13/2019) (DKFUA)   Diagnostic/therapeutic right IA steroid knee inj. x5 (07/31/2023) (100/100/100/80)  Therapeutic right (Zilretta) knee injection x3 (11/13/2023)  Therapeutic right (Monovisc) knee injection x1 (06/07/2021) (100/100/100/75)   Therapeutic left thoracic TPI x1 (10/17/2018) (DKFUA)  Diagnostic left SI joint Blk x2 (04/12/2023) (100/90/20/<25)   Therapeutic left lumbar facet MBB (L2-S1) (FCT: L3-4,L4-5,L5-S1) x5 (08/09/2021) (100/100/50)  Therapeutic left lumbar facet MBB (FCT: L4-5, L5-S1) x7 (11/01/2023) (100/100/70/L: 85)  Therapeutic left lumbar facet MBB (L4-S1) (FCT: L5-S1) x1 (08/29/2022) (100/100/100/100)  Therapeutic left lumbar facet RFA x2 (11/30/2022) (100/60/90/>75)    Therapeutic  Palliative (PRN) options:   Do not schedule any procedures without a proper preprocedure evaluation.   Completed by other providers:   Therapeutic right knee (ORTHOVISC) inj. x3 (05/18/2023, 05/23/2023, 05/30/2023) by Aram Candela, MD [facility]) Therapeutic left knee (Kenalog) inj. x1 (08/25/2020) by Novella Olive, MD [facility])   Pharmacotherapy  Nonopioids transfer 12/21/2020: Gabapentin         Recent Visits Date Type  Provider Dept  11/28/23 Office Visit [name], MD [facility]  11/13/23 Procedure visit [name], MD [facility]  11/12/23 Office Visit [name], MD [facility]  11/01/23 Procedure visit [name], MD [facility]  10/16/23 Procedure visit [name], MD [facility]  09/19/23 Office Visit [name], MD [facility]  Showing recent visits within past 90 days and meeting all other requirements Future Appointments Date Type Provider Dept  12/06/23 Appointment [name], MD [facility]  02/11/24 Appointment [name], MD [facility]  Showing future appointments within next 90 days and meeting all other requirements  Disposition: Discharge home  Discharge (Date  Time): 12/06/2023;   hrs.   Primary Care Physician: Dale Yardley, MD Location: [facility] Note by: Oswaldo Done, MD (TTS technology used. I apologize for any typographical errors that were not detected and corrected.) Date: 12/06/2023; Time: 12:39 PM  Disclaimer:  Medicine is not an Visual merchandiser. The only guarantee in medicine is that nothing is guaranteed. It is important to note that the decision to proceed with this intervention was based on the information collected from the patient. The Data and conclusions were drawn from the patient's questionnaire, the interview, and the physical examination. Because the information was provided in large part by the patient, it cannot be guaranteed that it has not been purposely or unconsciously manipulated. Every effort has been made to obtain as much relevant data as possible for this evaluation. It is important to note that the conclusions that lead to this procedure are derived in large part from the available data. Always take into account that the treatment will also be dependent on availability of  resources and existing treatment guidelines, considered by other Pain Management Practitioners as being common knowledge and practice, at the time of the intervention. For Medico-Legal purposes, it is also important to point out that variation in procedural techniques and pharmacological choices are the acceptable norm. The indications, contraindications, technique, and results of the above procedure  should only be interpreted and judged by a Board-Certified Interventional Pain Specialist with extensive familiarity and expertise in the same exact procedure and technique.

## 2023-12-05 NOTE — Assessment & Plan Note (Signed)
Avoid antiinflammatory medication. Follow metabolic panel. Recheck today.

## 2023-12-05 NOTE — Assessment & Plan Note (Signed)
No upper symptoms reported.  On protonix.   

## 2023-12-05 NOTE — Assessment & Plan Note (Signed)
Symptoms as outlined.  Appears to be c/w sinus infection.  Treat with amoxicillin.  Take probiotic as directed.  Saline nasal spray and steroid nasal spray as directed.

## 2023-12-05 NOTE — Assessment & Plan Note (Signed)
Low carb diet and exercise.  Follow met b and a1c.   

## 2023-12-05 NOTE — Assessment & Plan Note (Signed)
On crestor.  Follow lipid panel and liver function tests.   

## 2023-12-05 NOTE — Progress Notes (Signed)
Subjective:    Patient ID: Raelyn Number, female    DOB: Sep 01, 1935, 88 y.o.   MRN: [id number]  Patient here for  Chief Complaint  Patient presents with   Headache    Headache with nasel congestion x 1 week.     HPI Here for a scheduled follow up - here to follow up regarding hypertension and hypercholesterolemia. She reports she has been doing some better. Does report noticing some balance issues.  Some unsteadiness.  Feels leg muscles/core muscles are not as strong. No chest pain.  Breathing stable. She does report that over the last week, she has noticed some increased head and nasal congestion. Sinus pressure. Runny nose. Some drainage. No sore throat. No chest congestion. No sob. No vomiting or diarrhea.    Past Medical History:  Diagnosis Date   Acute postoperative pain 10/25/2017   Anemia    Arm pain 07/26/2015   Arthritis    Arthritis, degenerative 03/26/2014   Back pain 11/01/2013   Bladder infection 06/2018   Breast cancer (HCC)    Masectomy - left - 1986    Breast cancer [facility])    Mastectomy-right -2014   CHEST PAIN 04/29/2010   Qualifier: Diagnosis of  By: Laural Benes RN, Erika     Chronic cystitis    Cystocele 02/22/2013   Cystocele, midline 08/19/2013   Degeneration of intervertebral disc of lumbosacral region 03/26/2014   DYSPNEA 04/29/2010   Qualifier: Diagnosis of  By: Laural Benes RN, Erika     Enthesopathy of hip 03/26/2014   GERD (gastroesophageal reflux disease)    Hiatal hernia    HTN (hypertension)    Hypokalemia 06/2018   Hyponatremia 06/2018   LBP (low back pain) 03/26/2014   Neck pain 11/01/2013   Parkinson disease (HCC)    Pneumonia 06/2018   Sinusitis 02/07/2015   Skin lesions 07/12/2014   Urinary incontinence    mixed    Past Surgical History:  Procedure Laterality Date   ABDOMINAL HYSTERECTOMY     BLEPHAROPLASTY     BREAST BIOPSY  2013   BREAST ENHANCEMENT SURGERY  1987   BREAST IMPLANT REMOVAL     BREAST IMPLANT REMOVAL Right 08/29/2012   BREAST  SURGERY  1986   s/p left mastectomy   COLONOSCOPY WITH PROPOFOL N/A 09/13/2016   Procedure: COLONOSCOPY WITH PROPOFOL;  Surgeon: Scot Jun, MD;  Location: [facility] ENDOSCOPY;  Service: Endoscopy;  Laterality: N/A;   MASTECTOMY  08/2012   right   TONSILLECTOMY AND ADENOIDECTOMY  1979   VESICOVAGINAL FISTULA CLOSURE W/ TAH  1983   Family History  Problem Relation Age of Onset   Diabetes Father    Stroke Father    Colon polyps Father    Stroke Mother    Parkinson's disease Mother    Social History   Socioeconomic History   Marital status: Married    Spouse name: Not on file   Number of children: 3   Years of education: Not on file   Highest education level: Not on file  Occupational History   Not on file  Tobacco Use   Smoking status: Never    Passive exposure: Never   Smokeless tobacco: Never   Tobacco comments:    tobacco use - no  Vaping Use   Vaping status: Never Used  Substance and Sexual Activity   Alcohol use: No    Alcohol/week: 0.0 standard drinks of alcohol   Drug use: No   Sexual activity: Never  Other Topics Concern  Not on file  Social History Narrative   Not on file   Social Drivers of Health   Financial Resource Strain: Low Risk  (02/26/2023)   Received from [facility], [facility]   Overall Financial Resource Strain (CARDIA)    Difficulty of Paying Living Expenses: Not hard at all  Food Insecurity: No Food Insecurity (09/03/2023)   Hunger Vital Sign    Worried About Running Out of Food in the Last Year: Never true    Ran Out of Food in the Last Year: Never true  Transportation Needs: No Transportation Needs (09/03/2023)   PRAPARE - Administrator, Civil Service (Medical): No    Lack of Transportation (Non-Medical): No  Physical Activity: Not on file  Stress: No Stress Concern Present (07/12/2020)   [employer] of Occupational Health - Occupational Stress Questionnaire    Feeling of Stress : Not at all  Social  Connections: Socially Integrated (07/12/2020)   Social Connection and Isolation Panel [NHANES]    Frequency of Communication with Friends and Family: Three times a week    Frequency of Social Gatherings with Friends and Family: More than three times a week    Attends Religious Services: More than 4 times per year    Active Member of Clubs or Organizations: Yes    Attends Banker Meetings: 1 to 4 times per year    Marital Status: Married     Review of Systems  Constitutional:  Negative for appetite change and unexpected weight change.  HENT:  Positive for congestion, postnasal drip and sinus pressure.   Respiratory:  Negative for cough, chest tightness and shortness of breath.   Cardiovascular:  Negative for chest pain and palpitations.  Gastrointestinal:  Negative for abdominal pain, diarrhea, nausea and vomiting.  Genitourinary:  Negative for difficulty urinating and dysuria.  Musculoskeletal:  Negative for joint swelling and myalgias.  Skin:  Negative for color change and rash.  Neurological:  Negative for dizziness and headaches.  Psychiatric/Behavioral:  Negative for agitation and dysphoric mood.        Objective:     BP 138/82   Pulse (!) 56   Temp (!) 97.5 F (36.4 C)   Ht 5\' 3"  (1.6 m)   Wt 162 lb 3.2 oz (73.6 kg)   LMP 12/18/1981   SpO2 98%   BMI 28.73 kg/m  Wt Readings from Last 3 Encounters:  12/05/23 162 lb 3.2 oz (73.6 kg)  11/28/23 158 lb (71.7 kg)  11/13/23 159 lb (72.1 kg)    Physical Exam Vitals reviewed.  Constitutional:      General: She is not in acute distress.    Appearance: Normal appearance.  HENT:     Head: Normocephalic and atraumatic.     Comments: Increased sinus pressure to palpation.     Right Ear: External ear normal.     Left Ear: External ear normal.     Mouth/Throat:     Mouth: Mucous membranes are moist.     Pharynx: Oropharynx is clear.  Eyes:     General: No scleral icterus.       Right eye: No discharge.         Left eye: No discharge.     Conjunctiva/sclera: Conjunctivae normal.  Neck:     Thyroid: No thyromegaly.  Cardiovascular:     Rate and Rhythm: Normal rate and regular rhythm.  Pulmonary:     Effort: No respiratory distress.     Breath  sounds: Normal breath sounds. No wheezing.  Abdominal:     General: Bowel sounds are normal.     Palpations: Abdomen is soft.     Tenderness: There is no abdominal tenderness.  Musculoskeletal:        General: No swelling or tenderness.     Cervical back: Neck supple. No tenderness.  Lymphadenopathy:     Cervical: No cervical adenopathy.  Skin:    Findings: No erythema or rash.  Neurological:     Mental Status: She is alert.  Psychiatric:        Mood and Affect: Mood normal.        Behavior: Behavior normal.      Outpatient Encounter Medications as of 12/05/2023  Medication Sig   amLODipine (NORVASC) 2.5 MG tablet Take 1 tablet (2.5 mg total) by mouth daily.   amoxicillin (AMOXIL) 875 MG tablet Take 1 tablet (875 mg total) by mouth 2 (two) times daily for 10 days.   aspirin 81 MG EC tablet Take 81 mg by mouth daily as needed.   calcium carbonate (OSCAL) 1500 (600 Ca) MG TABS tablet Take 1 tablet by mouth daily.   ciprofloxacin (CIPRO) 500 MG tablet Take 500 mg by mouth 2 (two) times daily.   citalopram (CELEXA) 10 MG tablet Take 10 mg by mouth daily.   citalopram (CELEXA) 20 MG tablet TAKE 1 TABLET BY MOUTH ONCE DAILY.   cyclobenzaprine (FLEXERIL) 10 MG tablet TAKE ONE TABLET BY MOUTH AT BEDTIME.   donepezil (ARICEPT) 10 MG tablet TAKE ONE TABLET BY MOUTH AT BEDTIME   gabapentin (NEURONTIN) 300 MG capsule Take 1 capsule (300 mg total) by mouth at bedtime.   Glucosamine-Chondroitin 500-400 MG CAPS Take 2 capsules by mouth daily.   hydrochlorothiazide (HYDRODIURIL) 12.5 MG tablet Take 12.5 mg by mouth every morning.   letrozole (FEMARA) 2.5 MG tablet Take 2.5 mg by mouth daily.   losartan (COZAAR) 50 MG tablet TAKE (2) TABLETS BY MOUTH ONCE  DAILY.   lubiprostone (AMITIZA) 24 MCG capsule TAKE 1 CAPSULE BY MOUTH ONCE DAILY WITH BREAKFAST.   magnesium oxide (MAG-OX) 400 (240 Mg) MG tablet TAKE ONE TABLET BY MOUTH EVERY DAY   melatonin 1 MG TABS tablet Take 3 mg by mouth at bedtime.   metoprolol succinate (TOPROL-XL) 25 MG 24 hr tablet Take 1 tablet (25 mg total) by mouth 2 (two) times daily.   mirabegron ER (MYRBETRIQ) 50 MG TB24 tablet Take 1 tablet (50 mg total) by mouth daily.   Multiple Vitamin (MULTIVITAMIN) tablet Take 1 tablet by mouth daily.   naloxone (NARCAN) nasal spray 4 mg/0.1 mL Place 1 spray into the nose as needed for up to 365 doses (for opioid-induced respiratory depresssion). In case of emergency (overdose), spray once into each nostril. If no response within 3 minutes, repeat application and call 911.   nitrofurantoin, macrocrystal-monohydrate, (MACROBID) 100 MG capsule Take 100 mg by mouth 2 (two) times daily.   Omega-3 1000 MG CAPS Take by mouth.   oxyCODONE (OXY IR/ROXICODONE) 5 MG immediate release tablet Take 1 tablet (5 mg total) by mouth every 6 (six) hours as needed for severe pain (pain score 7-10) or breakthrough pain (PRN for post-radiofrequency pain). Must last 30 days.   [START ON 12/14/2023] oxyCODONE (OXY IR/ROXICODONE) 5 MG immediate release tablet Take 1 tablet (5 mg total) by mouth every 6 (six) hours as needed for severe pain (pain score 7-10) or breakthrough pain (PRN for post-radiofrequency pain). Must last 30 days.   [START  ON 01/13/2024] oxyCODONE (OXY IR/ROXICODONE) 5 MG immediate release tablet Take 1 tablet (5 mg total) by mouth every 6 (six) hours as needed for severe pain (pain score 7-10) or breakthrough pain (PRN for post-radiofrequency pain). Must last 30 days.   pantoprazole (PROTONIX) 40 MG tablet TAKE ONE TABLET BY MOUTH EVERY DAY   rosuvastatin (CRESTOR) 20 MG tablet TAKE ONE TABLET BY MOUTH AT BEDTIME   No facility-administered encounter medications on file as of 12/05/2023.     Lab  Results  Component Value Date   WBC 5.5 11/07/2023   HGB 11.2 (L) 11/07/2023   HCT 34.9 (L) 11/07/2023   PLT 187 11/07/2023   GLUCOSE 86 12/05/2023   CHOL 112 09/11/2023   TRIG 96.0 09/11/2023   HDL 47.90 09/11/2023   LDLDIRECT 73.0 07/09/2018   LDLCALC 45 09/11/2023   ALT 19 09/11/2023   AST 25 09/11/2023   NA 134 (L) 12/05/2023   K 4.3 12/05/2023   CL 100 12/05/2023   CREATININE 0.93 12/05/2023   BUN 30 (H) 12/05/2023   CO2 29 12/05/2023   TSH 2.86 09/11/2023   HGBA1C 5.3 09/11/2023    MR KNEE RIGHT WO CONTRAST Result Date: 11/29/2023 CLINICAL DATA:  Chronic knee pain. Erosive osteoarthritis suspected. Degenerative disease on x-ray. EXAM: MRI OF THE RIGHT KNEE WITHOUT CONTRAST TECHNIQUE: Multiplanar, multisequence MR imaging of the knee was performed. No intravenous contrast was administered. COMPARISON:  MRI right knee 01/04/2022, right knee radiographs 03/15/2021 FINDINGS: MENISCI Medial meniscus: There is mildly worsened intermediate proton density signal intrasubstance degeneration within the posterior horn of the medial meniscus. There is new vertically oriented linear increased signal indicating a tear extending through the superior and inferior articular surfaces of the junction of the middle and central thirds of the meniscal triangle of the posterior horn of the medial meniscus (sagittal series 12, image 20). This connects to a new oblique undersurface tear extending through the inferior articular surface of the junction of middle and central thirds of the meniscal triangle (sagittal images 21 and 22). Lateral meniscus:  Intact. LIGAMENTS Cruciates: The ACL and PCL are intact. Collaterals: The medial collateral ligament is intact. The fibular collateral ligament, biceps femoris tendon, iliotibial band, and popliteus tendon are intact. CARTILAGE Patellofemoral: Diffuse full-thickness cartilage loss throughout the majority of the medial patellar facet and medial trochlea, similar  to prior. Moderate thinning of the lateral patellar facet cartilage, greatest inferiorly. Medial: Moderate to high-grade thinning of the weight-bearing medial femoral condyle cartilage, greatest anteriorly where there is focal full-thickness cartilage loss (coronal series 10, image 16) this appears mildly worsened from prior. Lateral: Moderate thinning of the mid to medial aspect of the lateral tibial plateau cartilage. The prior full-thickness fissure in this region is less well visualized but there is a larger region of cartilage thinning (coronal series 10 images 12 and 13). Mild-to-moderate thinning of the adjacent weight-bearing lateral femoral condyle cartilage. Joint: Small to moderate joint effusion, mildly increased from prior. Normal Hoffa's fat pad. No plical thickening. Popliteal Fossa:  No Baker's cyst. Extensor Mechanism: Intact quadriceps tendon insertion. Minimal intermediate T2 signal within the deep proximal aspect of the patellar tendon, minimal tendinosis that is new from prior. The medial and lateral patellofemoral retinacula are intact. Bones:  No acute fracture or dislocation. Other: None. IMPRESSION: Compared to 01/04/2022: 1. New tiny vertically oriented tear extending through the superior and inferior articular surfaces of the junction of the middle and central thirds of the meniscal triangle of the posterior horn of  the medial meniscus (seen on a single sagittal image only. This connects to a new oblique undersurface tear extending through the inferior articular surface of the junction of middle and central thirds of the meniscal triangle of the more medial aspect of the posterior horn. 2. Tricompartmental cartilage degenerative changes, greatest within the medial patellofemoral compartment where there is diffuse full-thickness cartilage loss throughout the majority of the medial patellar facet and medial trochlea, similar to prior. 3. Small to moderate joint effusion, mildly increased  from prior. 4. Minimal proximal patellar tendinosis, new from prior. Electronically Signed   By: Neita Garnet M.D.   On: 11/29/2023 09:45       Assessment & Plan:  Hypertension, unspecified type Assessment & Plan: Blood pressure as outlined. On metoprolol, amlodipine and losartan. Follow pressures.  Follow metabolic panel.   Orders: -     Basic metabolic panel  Anemia, unspecified type Assessment & Plan: Hematology 04/2023 - retacrit.  F/u. Retacrit if her hemoglobin remains below 10.0.  follow cbc.    Aortic atherosclerosis (HCC) Assessment & Plan: Continue crestor.    Chronic kidney disease, stage 3a (HCC) Assessment & Plan: Avoid antiinflammatory medication. Follow metabolic panel. Recheck today.    Depression, major, single episode, mild (HCC) Assessment & Plan: Continue citalopram.  Overall appears to be handling things well.  Follow.     Gastroesophageal reflux disease, unspecified whether esophagitis present Assessment & Plan: No upper symptoms reported. On protonix.    Hypercholesterolemia Assessment & Plan: On crestor.  Follow lipid panel and liver function tests.     Hyperglycemia Assessment & Plan: Low carb diet and exercise.  Follow met b and a1c.    Hyponatremia Assessment & Plan: Recheck sodium today.    Osteoporosis without current pathological fracture, unspecified osteoporosis type Assessment & Plan: zometa - per Dr Orlie Dakin    Sinusitis, unspecified chronicity, unspecified location Assessment & Plan: Symptoms as outlined.  Appears to be c/w sinus infection.  Treat with amoxicillin.  Take probiotic as directed.  Saline nasal spray and steroid nasal spray as directed.    Unsteadiness Assessment & Plan: Unsteady gait, weakness and balance issues. Discussed PT. She is agreeable. Referral for PT to evaluate and treat.   Orders: -     Ambulatory referral to Physical Therapy  Other orders -     Amoxicillin; Take 1 tablet (875 mg total)  by mouth 2 (two) times daily for 10 days.  Dispense: 20 tablet; Refill: 0     Dale [place], MD

## 2023-12-05 NOTE — Assessment & Plan Note (Signed)
zometa - per Dr Grayland Ormond

## 2023-12-05 NOTE — Assessment & Plan Note (Signed)
Recheck sodium today.  

## 2023-12-05 NOTE — Assessment & Plan Note (Signed)
Continue crestor 

## 2023-12-05 NOTE — Patient Instructions (Signed)
Take a probiotic daily while you are on the antibiotic and for two weeks after completing the antibiotic.   Continue the saline nasal spray.   Nasacort nasal spray - 2 sprays each nostril one time per day.

## 2023-12-05 NOTE — Assessment & Plan Note (Signed)
Unsteady gait, weakness and balance issues. Discussed PT. She is agreeable. Referral for PT to evaluate and treat.

## 2023-12-05 NOTE — Patient Instructions (Incomplete)
Post-procedure Information What to expect: Most procedures involve the use of a local anesthetic (numbing medicine), and a steroid (anti-inflammatory medicine).  The local anesthetics may cause temporary numbness and weakness of the legs or arms, depending on the location of the block. This numbness/weakness may last 4-6 hours, depending on the local anesthetic used. In rare instances, it can last up to 24 hours. While numb, you must be very careful not to injure the extremity.  After any procedure, you could expect the pain to get better within 15-20 minutes. This relief is temporary and may last 4-6 hours. Once the local anesthetics wears off, you could experience discomfort, possibly more than usual, for up to 10 (ten) days. In the case of radiofrequencies, it may last up to 6 weeks. Surgeries may take up to 8 weeks for the healing process. The discomfort is due to the irritation caused by needles going through skin and muscle. To minimize the discomfort, we recommend using ice the first day, and heat from then on. The ice should be applied for 15 minutes on, and 15 minutes off. Keep repeating this cycle until bedtime. Avoid applying the ice directly to the skin, to prevent frostbite. Heat should be used daily, until the pain improves (4-10 days). Be careful not to burn yourself.  Occasionally you may experience muscle spasms or cramps. These occur as a consequence of the irritation caused by the needle sticks to the muscle and the blood that will inevitably be lost into the surrounding muscle tissue. Blood tends to be very irritating to tissues, which tend to react by going into spasm. These spasms may start the same day of your procedure, but they may also take days to develop. This late onset type of spasm or cramp is usually caused by electrolyte imbalances triggered by the steroids, at the level of the kidney. Cramps and spasms tend to respond well to muscle relaxants, multivitamins (some are  triggered by the procedure, but may have their origins in vitamin deficiencies), and "Gatorade", or any sports drinks that can replenish any electrolyte imbalances. (If you are a diabetic, ask your pharmacist to get you a sugar-free brand.) Warm showers or baths may also be helpful. Stretching exercises are highly recommended. General Instructions:  Be alert for signs of possible infection: redness, swelling, heat, red streaks, elevated temperature, and/or fever. These typically appear 4 to 6 days after the procedure. Immediately notify your doctor if you experience unusual bleeding, difficulty breathing, or loss of bowel or bladder control. If you experience increased pain, do not increase your pain medicine intake, unless instructed by your pain physician. Post-Procedure Care:  Be careful in moving about. Muscle spasms in the area of the injection may occur. Applying ice or heat to the area is often helpful. The incidence of spinal headaches after epidural injections ranges between 1.4% and 6%. If you develop a headache that does not seem to respond to conservative therapy, please let your physician know. This can be treated with an epidural blood patch.   Post-procedure numbness or redness is to be expected, however it should average 4 to 6 hours. If numbness and weakness of your extremities begins to develop 4 to 6 hours after your procedure, and is felt to be progressing and worsening, immediately contact your physician.   Diet:  If you experience nausea, do not eat until this sensation goes away. If you had a "Stellate Ganglion Block" for upper extremity "Reflex Sympathetic Dystrophy", do not eat or drink until your   hoarseness goes away. In any case, always start with liquids first and if you tolerate them well, then slowly progress to more solid foods. Activity:  For the first 4 to 6 hours after the procedure, use caution in moving about as you may experience numbness and/or weakness. Use caution in  cooking, using household electrical appliances, and climbing steps. If you need to reach your Doctor call our office: [phone] Monday-Thursday 8:00 am - 4:00 PM    Fridays: Closed     In case of an emergency: In case of emergency, call 911 or go to the nearest emergency room and have the physician there call us.  Interpretation of Procedure Every nerve block has two components: a diagnostic component, and a treatment component. Unrealistic expectations are the most common causes of "perceived failure".  In a perfect world, a single nerve block should be able to completely and permanently eliminate the pain. Sadly, the world is not perfect.  Most pain management nerve blocks are performed using local anesthetics and steroids. Steroids are responsible for any long-term benefit that you may experience. Their purpose is to decrease any chronic swelling that may exist in the area. Steroids begin to work immediately after being injected. However, most patients will not experience any benefits until 5 to 10 days after the injection, when the swelling has come down to the point where they can tell a difference. Steroids will only help if there is swelling to be treated. As such, they can assist with the diagnosis. If effective, they suggest an inflammatory component to the pain, and if ineffective, they rule out inflammation as the main cause or component of the problem. If the problem is one of mechanical compression, you will get no benefit from those steroids.   In the case of local anesthetics, they have a crucial role in the diagnosis of your condition. Most will begin to work within15 to 20 minutes after injection. The duration will depend on the type used (short- vs. Long-acting). It is of outmost importance that patients keep tract of their pain, after the procedure. To assist with this matter, a "Post-procedure Pain Diary" is provided. Make sure to complete it and to bring it back to your  follow-up appointment.  As long as the patient keeps accurate, detailed records of their symptoms after every procedure, and returns to have those interpreted, every procedure will provide us with invaluable information. Even a block that does not provide the patient with any relief, will always provide us with information about the mechanism and the origin of the pain. The only time a nerve block can be considered a waste of time is when patients do not keep track of the results, or do not keep their post-procedure appointment.  Reporting the results back to your physician The Pain Score  Pain is a subjective complaint. It cannot be seen, touched, or measured. We depend entirely on the patient's report of the pain in order to assess your condition and treatment. To evaluate the pain, we use a pain scale, where "0" means "No Pain", and a "10" is "the worst possible pain that you can even imagine" (i.e. something like been eaten alive by a shark or being torn apart by a lion).   You will frequently be asked to rate your pain. Please be as accurate, remember that medical decisions will be based on your responses. Please do not rate your pain above a 10. Doing so is actually interpreted as "symptom magnification" (exaggeration), as   well as lack of understanding with regards to the scale. To put this into perspective, when you tell us that your pain is at a 10 (ten), what you are saying is that there is nothing we can do to make this pain any worse. (Carefully think about that.)  ____________________________________________________________________________________________  Post-Procedure Discharge Instructions  Instructions: Apply ice:  Purpose: This will minimize any swelling and discomfort after procedure.  When: Day of procedure, as soon as you get home. How: Fill a plastic sandwich bag with crushed ice. Cover it with a small towel and apply to injection site. How long: (15 min on, 15 min off) Apply  for 15 minutes then remove x 15 minutes.  Repeat sequence on day of procedure, until you go to bed. Apply heat:  Purpose: To treat any soreness and discomfort from the procedure. When: Starting the next day after the procedure. How: Apply heat to procedure site starting the day following the procedure. How long: May continue to repeat daily, until discomfort goes away. Food intake: Start with clear liquids (like water) and advance to regular food, as tolerated.  Physical activities: Keep activities to a minimum for the first 8 hours after the procedure. After that, then as tolerated. Driving: If you have received any sedation, be responsible and do not drive. You are not allowed to drive for 24 hours after having sedation. Blood thinner: (Applies only to those taking blood thinners) You may restart your blood thinner 6 hours after your procedure. Insulin: (Applies only to Diabetic patients taking insulin) As soon as you can eat, you may resume your normal dosing schedule. Infection prevention: Keep procedure site clean and dry. Shower daily and clean area with soap and water. Post-procedure Pain Diary: Extremely important that this be done correctly and accurately. Recorded information will be used to determine the next step in treatment. For the purpose of accuracy, follow these rules: Evaluate only the area treated. Do not report or include pain from an untreated area. For the purpose of this evaluation, ignore all other areas of pain, except for the treated area. After your procedure, avoid taking a long nap and attempting to complete the pain diary after you wake up. Instead, set your alarm clock to go off every hour, on the hour, for the initial 8 hours after the procedure. Document the duration of the numbing medicine, and the relief you are getting from it. Do not go to sleep and attempt to complete it later. It will not be accurate. If you received sedation, it is likely that you were given a  medication that may cause amnesia. Because of this, completing the diary at a later time may cause the information to be inaccurate. This information is needed to plan your care. Follow-up appointment: Keep your post-procedure follow-up evaluation appointment after the procedure (usually 2 weeks for most procedures, 6 weeks for radiofrequencies). DO NOT FORGET to bring you pain diary with you.   Expect: (What should I expect to see with my procedure?) From numbing medicine (AKA: Local Anesthetics): Numbness or decrease in pain. You may also experience some weakness, which if present, could last for the duration of the local anesthetic. Onset: Full effect within 15 minutes of injected. Duration: It will depend on the type of local anesthetic used. On the average, 1 to 8 hours.  From steroids (Applies only if steroids were used): Decrease in swelling or inflammation. Once inflammation is improved, relief of the pain will follow. Onset of benefits: Depends on the  amount of swelling present. The more swelling, the longer it will take for the benefits to be seen. In some cases, up to 10 days. Duration: Steroids will stay in the system x 2 weeks. Duration of benefits will depend on multiple posibilities including persistent irritating factors. Side-effects: If present, they may typically last 2 weeks (the duration of the steroids). Frequent: Cramps (if they occur, drink Gatorade and take over-the-counter Magnesium 450-500 mg once to twice a day); water retention with temporary weight gain; increases in blood sugar; decreased immune system response; increased appetite. Occasional: Facial flushing (red, warm cheeks); mood swings; menstrual changes. Uncommon: Long-term decrease or suppression of natural hormones; bone thinning. (These are more common with higher doses or more frequent use. This is why we prefer that our patients avoid having any injection therapies in other practices.)  Very Rare: Severe mood  changes; psychosis; aseptic necrosis. From procedure: Some discomfort is to be expected once the numbing medicine wears off. This should be minimal if ice and heat are applied as instructed.  Call if: (When should I call?) You experience numbness and weakness that gets worse with time, as opposed to wearing off. New onset bowel or bladder incontinence. (Applies only to procedures done in the spine)  Emergency Numbers: Durning business hours (Monday - Thursday, 8:00 AM - 4:00 PM) (Friday, 9:00 AM - 12:00 Noon): [phone] After hours: [phone] NOTE: If you are having a problem and are unable connect with, or to talk to a provider, then go to your nearest urgent care or emergency department. If the problem is serious and urgent, please call 911. ____________________________________________________________________________________________

## 2023-12-05 NOTE — Assessment & Plan Note (Signed)
Hematology 04/2023 - retacrit.  F/u. Retacrit if her hemoglobin remains below 10.0.  follow cbc.

## 2023-12-05 NOTE — Assessment & Plan Note (Signed)
Blood pressure as outlined. On metoprolol, amlodipine and losartan. Follow pressures.  Follow metabolic panel.

## 2023-12-05 NOTE — Assessment & Plan Note (Signed)
Continue citalopram.  Overall appears to be handling things well.  Follow.   

## 2023-12-06 ENCOUNTER — Encounter: Payer: Self-pay | Admitting: Pain Medicine

## 2023-12-06 ENCOUNTER — Ambulatory Visit: Payer: Medicare HMO | Attending: Pain Medicine | Admitting: Pain Medicine

## 2023-12-06 VITALS — BP 136/70 | HR 68 | Temp 97.3°F | Resp 16 | Ht 63.0 in | Wt 162.0 lb

## 2023-12-06 DIAGNOSIS — M25061 Hemarthrosis, right knee: Secondary | ICD-10-CM | POA: Insufficient documentation

## 2023-12-06 DIAGNOSIS — E8889 Other specified metabolic disorders: Secondary | ICD-10-CM | POA: Diagnosis not present

## 2023-12-06 DIAGNOSIS — M7051 Other bursitis of knee, right knee: Secondary | ICD-10-CM | POA: Diagnosis not present

## 2023-12-06 DIAGNOSIS — G8929 Other chronic pain: Secondary | ICD-10-CM | POA: Insufficient documentation

## 2023-12-06 DIAGNOSIS — M7050 Other bursitis of knee, unspecified knee: Secondary | ICD-10-CM | POA: Insufficient documentation

## 2023-12-06 DIAGNOSIS — M1711 Unilateral primary osteoarthritis, right knee: Secondary | ICD-10-CM | POA: Diagnosis not present

## 2023-12-06 DIAGNOSIS — M25561 Pain in right knee: Secondary | ICD-10-CM | POA: Diagnosis not present

## 2023-12-06 MED ORDER — ROPIVACAINE HCL 2 MG/ML IJ SOLN
9.0000 mL | Freq: Once | INTRAMUSCULAR | Status: AC
  Administered 2023-12-06: 9 mL

## 2023-12-06 MED ORDER — METHYLPREDNISOLONE ACETATE 80 MG/ML IJ SUSP: 80.0000 mg | Freq: Once | INTRAMUSCULAR | Status: DC

## 2023-12-06 MED ORDER — LIDOCAINE HCL 2 % IJ SOLN
20.0000 mL | Freq: Once | INTRAMUSCULAR | Status: AC
Start: 2023-12-06 — End: 2023-12-06
  Administered 2023-12-06: 100 mg

## 2023-12-06 MED ORDER — LIDOCAINE HCL (PF) 2 % IJ SOLN
INTRAMUSCULAR | Status: AC
  Filled 2023-12-06: qty 5

## 2023-12-06 MED ORDER — ROPIVACAINE HCL 2 MG/ML IJ SOLN
INTRAMUSCULAR | Status: AC
Start: 2023-12-06 — End: ?
  Filled 2023-12-06: qty 20

## 2023-12-06 MED ORDER — PENTAFLUOROPROP-TETRAFLUOROETH EX AERO
INHALATION_SPRAY | Freq: Once | CUTANEOUS | Status: AC
Start: 2023-12-06 — End: 2023-12-06
  Administered 2023-12-06: 30 via TOPICAL

## 2023-12-06 MED ORDER — METHYLPREDNISOLONE ACETATE 80 MG/ML IJ SUSP
INTRAMUSCULAR | Status: AC
Start: 2023-12-06 — End: ?
  Filled 2023-12-06: qty 1

## 2023-12-06 NOTE — Progress Notes (Signed)
Safety precautions to be maintained throughout the outpatient stay will include: orient to surroundings, keep bed in low position, maintain call bell within reach at all times, provide assistance with transfer out of bed and ambulation.  

## 2023-12-07 ENCOUNTER — Telehealth: Payer: Self-pay | Admitting: *Deleted

## 2023-12-07 NOTE — Telephone Encounter (Signed)
Post procedure call:   no  questions or concerns.  

## 2023-12-10 ENCOUNTER — Other Ambulatory Visit: Payer: Self-pay | Admitting: Pain Medicine

## 2023-12-19 DIAGNOSIS — Z9181 History of falling: Secondary | ICD-10-CM | POA: Insufficient documentation

## 2023-12-19 DIAGNOSIS — E871 Hypo-osmolality and hyponatremia: Secondary | ICD-10-CM | POA: Insufficient documentation

## 2023-12-20 ENCOUNTER — Inpatient Hospital Stay: Payer: Medicare Other

## 2023-12-20 ENCOUNTER — Encounter: Payer: Self-pay | Admitting: Oncology

## 2023-12-20 ENCOUNTER — Inpatient Hospital Stay: Payer: Medicare Other | Attending: Oncology

## 2023-12-20 DIAGNOSIS — D631 Anemia in chronic kidney disease: Secondary | ICD-10-CM | POA: Diagnosis not present

## 2023-12-20 DIAGNOSIS — D649 Anemia, unspecified: Secondary | ICD-10-CM

## 2023-12-20 DIAGNOSIS — N186 End stage renal disease: Secondary | ICD-10-CM | POA: Diagnosis not present

## 2023-12-20 DIAGNOSIS — M810 Age-related osteoporosis without current pathological fracture: Secondary | ICD-10-CM | POA: Diagnosis not present

## 2023-12-20 LAB — CBC WITH DIFFERENTIAL/PLATELET
Abs Immature Granulocytes: 0.02 10*3/uL (ref 0.00–0.07)
Basophils Absolute: 0 10*3/uL (ref 0.0–0.1)
Basophils Relative: 1 %
Eosinophils Absolute: 0.1 10*3/uL (ref 0.0–0.5)
Eosinophils Relative: 1 %
HCT: 34.5 % — ABNORMAL LOW (ref 36.0–46.0)
Hemoglobin: 11 g/dL — ABNORMAL LOW (ref 12.0–15.0)
Immature Granulocytes: 0 %
Lymphocytes Relative: 26 %
Lymphs Abs: 1.7 10*3/uL (ref 0.7–4.0)
MCH: 29.4 pg (ref 26.0–34.0)
MCHC: 31.9 g/dL (ref 30.0–36.0)
MCV: 92.2 fL (ref 80.0–100.0)
Monocytes Absolute: 0.5 10*3/uL (ref 0.1–1.0)
Monocytes Relative: 8 %
Neutro Abs: 4.2 10*3/uL (ref 1.7–7.7)
Neutrophils Relative %: 64 %
Platelets: 199 10*3/uL (ref 150–400)
RBC: 3.74 MIL/uL — ABNORMAL LOW (ref 3.87–5.11)
RDW: 14.9 % (ref 11.5–15.5)
WBC: 6.5 10*3/uL (ref 4.0–10.5)
nRBC: 0 % (ref 0.0–0.2)

## 2023-12-20 NOTE — Progress Notes (Signed)
 Patient did not receive injection today, hemoglobin is 11 today.

## 2023-12-24 NOTE — Progress Notes (Signed)
 PROVIDER NOTE: Information contained herein reflects review and annotations entered in association with encounter. Interpretation of such information and data should be left to medically-trained personnel. Information provided to patient can be located elsewhere in the medical record under Patient Instructions. Document created using STT-dictation technology, any transcriptional errors that may result from process are unintentional.    Patient: Wanda Martinez  Service Category: E/M  Provider: Eric DELENA Como, MD  DOB: [birth year]  DOS: 12/25/2023  Referring Provider: Glendia Shad, MD  MRN: [id number]  Specialty: Interventional Pain Management  PCP: Glendia Shad, MD  Type: Established Patient  Setting: Ambulatory outpatient    Location: Office  Delivery: Face-to-face     HPI  [name], a 89 y.o. year old female, is here today because of her Chronic pain of right knee [M25.561, G89.29]. [name] primary complain today is Back Pain, Shoulder Pain (right), and Knee Pain (right)  Pertinent problems: [name] has Headache; Parkinson's disease (HCC); Chronic low back pain (1ry area of Pain) (Bilateral) (R>L) w/o sciatica; Lumbar spondylosis; Chronic hip pain (Bilateral); Chronic neck pain; Cervical spondylosis; Chronic cervical radicular pain (Right); Diffuse myofascial pain syndrome; Neurogenic pain; Chronic upper back pain (Right); Myofascial pain syndrome (Right) (cervicothoracic); Lumbar facet syndrome (Bilateral) (R>L); History of breast cancer; Cervical facet hypertrophy; Cervical facet syndrome (HCC); Chronic shoulder pain (Right); Chronic pain syndrome; Chronic sacroiliac joint pain (Left); Chronic sacroiliac joint pain (Right); 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, L4-5, L5-S1) (Right); Lumbar spinal stenosis (with neurogenic claudication) (L3-4); Chronic lower extremity pain (Right); Chronic lumbar radicular pain (Right); Trochanteric bursitis of hip (Bilateral);  Spondylosis without myelopathy or radiculopathy, lumbosacral region; Trigger point with back pain (Right); DDD (degenerative disc disease), lumbosacral; Chronic upper extremity pain (Right); Chronic thoracic back pain (Bilateral) (L>R); Trigger point of thoracic region (Bilateral) (L>R); Other specified dorsopathies, sacral and sacrococcygeal region; Sacroiliac joint dysfunction (Right); Osteoarthritis of sacroiliac joint (Right); Somatic dysfunction of sacroiliac joint (Right); Chronic musculoskeletal pain; Facial pain; Chronic hip pain (Right); Osteoarthritis of hip (Right); Left ear pain; 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HCC); Abnormal MRI, lumbar spine (08/23/2022); Osteoarthritis involving multiple joints; Other spondylosis, sacral and sacrococcygeal region; Lumbar facet hypertrophy (Multilevel) (Bilateral); Chronic knee pain (Right); Osteoarthritis of knee (Right); Trigger point of shoulder region (Right); Osteoarthritis of AC (acromioclavicular) joint (Right); Osteoarthritis of shoulder (Right); Unspecified injury of muscle(s) and tendon(s) of the rotator cuff of shoulder, sequela (Right); Cervicalgia; DDD (degenerative disc disease), lumbar; Lumbar radicular pain (Bilateral); Lumbar foraminal stenosis (Right: L3-4, L4-5, L5-S1) (Left: L2-3); Abnormal MRI, hip (Right) (05/04/2022); Abnormal MRI, knee (Right) (11/29/2023); Tricompartment osteoarthritis of knee (Right); Chronic low back pain (Right) w/o sciatica; Hoffa's fat pad disease (HCC) (Right); Hoffa's pad syndrome (HCC) (Right); Chronic low back pain (Right) w/ sciatica (Right); Lower extremity weakness (Right); Chronic radicular pain of lower back; Chronic sciatica (Right); Lumbosacral radiculopathy at L5 (Right); Low back pain of over 3 months duration; Personal history of breast cancer; Pes anserine bursitis (Right); Infrapatellar bursitis of knee (Right); Traumatic ecchymosis of right lower leg; Lumbar facet joint pain; Medial knee pain (Right);  Chronic patellofemoral pain of knee (Right); Patellofemoral joint pain (Right); Knee hemarthrosis (Right); Pes anserinus bursitis of right knee; and Abnormal MRI, shoulder (Right) (06/21/2023) on their pertinent problem list. Pain Assessment: Severity of Chronic pain is reported as a 8 /10. Location: Back Lower/radiates down right leg to calf in the back and the side. Onset: More than a month ago. Quality: Aching, Constant. Timing: Constant. Modifying factor(s): procedures. Vitals:  height  is 5' 3 (1.6 m) and weight is 159 lb (72.1 kg). Her temperature is 97.9 F (36.6 C). Her blood pressure is 123/67 and her pulse is 56 (abnormal). Her respiration is 16 and oxygen saturation is 99%.  BMI: Estimated body mass index is 28.17 kg/m as calculated from the following:   Height as of this encounter: 5' 3 (1.6 m).   Weight as of this encounter: 159 lb (72.1 kg). Last encounter: 11/28/2023. Last procedure: 12/06/2023.  Reason for encounter: post-procedure evaluation and assessment.  Discussed the use of AI scribe software for clinical note transcription with the patient, who gave verbal consent to proceed.  History of Present Illness   The patient presents with persistent pain in the shoulder, knee, and back. The knee pain, which is located medially, has improved by approximately fifty percent following a recent injection. However, the discomfort persists, and the patient reports that she has not completely resolved. An MRI of the right knee, conducted in December 2024, revealed a new vertical tear in the meniscus and tricompartmental osteoarthritis, with the medial compartment being the most affected. The patient has been referred to an orthopedic surgeon for further evaluation and potential knee replacement.  The patient also reports bilateral lower back pain, which has previously been managed with lumbar facet blocks. The last block, performed in November, provided about seventy percent relief of the  back pain. The patient also experiences pain in the calf area of the right leg, which was alleviated when the knee was numbed, suggesting the pain may be originating from the knee rather than the back.  In addition to the knee and back pain, the patient complains of constant shoulder pain. The severity of the shoulder pain is such that the patient is considering prioritizing treatment for this over the back pain. The patient has been advised to discuss the shoulder pain with the orthopedic surgeon during her upcoming consultation. The patient is currently on medication, with refills available until February 2025.      Post-procedure evaluation    Procedure:          Anesthesia, Analgesia, Anxiolysis:  Type: Therapeutic Pes anserine Knee Bursa Injection #2  Region: Lateral infrapatellar Knee Region Level: Knee Joint Laterality: Right knee  Type: Local Anesthesia Local Anesthetic: Lidocaine  1-2% Sedation: None  Indication(s):  Analgesia Route: Infiltration ([place]/IM) IV Access: N/A   Position: Sitting   1. Chronic knee pain (Right)   2. Hoffa's fat pad disease (HCC) (Right)   3. Hoffa's pad syndrome (HCC) (Right)   4. Infrapatellar bursitis of knee (Right)   5. Knee hemarthrosis (Right)   6. Osteoarthritis of knee (Right)   7. Patellofemoral joint pain (Right)   8. Pes anserine bursitis (Right)   9. Pes anserinus bursitis of right knee    NAS-11 Pain score:   Pre-procedure: 8 /10   Post-procedure: 0-No pain/10     Effectiveness:  Initial hour after procedure: 100 %. Subsequent 4-6 hours post-procedure: 100 %. Analgesia past initial 6 hours: 50 % (ongoing). Ongoing improvement:  Analgesic: The patient indicates having a 50% improvement of her pain in the medial aspect of her right knee.  Today we have reviewed her MRI and clearly she has problems that warrant orthopedic evaluation for possible right knee replacement. Function: Somewhat improved ROM: Somewhat  improved   Pharmacotherapy Assessment  Analgesic:  Oxycodone  IR 5 mg, 1 tab PO q 8 hrs (15 mg/day of oxycodone ) MME/day: 22.5 mg/day.   Monitoring: [place] PMP: PDMP reviewed during  this encounter.       Pharmacotherapy: No side-effects or adverse reactions reported. Compliance: No problems identified. Effectiveness: Clinically acceptable.  Rebecka Wolm HERO, RN  12/25/2023 12:56 PM  Sign when Signing Visit Safety precautions to be maintained throughout the outpatient stay will include: orient to surroundings, keep bed in low position, maintain call bell within reach at all times, provide assistance with transfer out of bed and ambulation.    No results found for: CBDTHCR No results found for: D8THCCBX No results found for: D9THCCBX  UDS:  Summary  Date Value Ref Range Status  07/31/2023 Note  Final    Comment:    ==================================================================== [facility] (MW) ==================================================================== Test                             Result       Flag       Units  Drug Present and Declared for Prescription Verification   Oxycodone                       839          EXPECTED   ng/mg creat   Noroxycodone                   3412         EXPECTED   ng/mg creat    Sources of oxycodone  include scheduled prescription medications.    Noroxycodone is an expected metabolite of oxycodone .  Drug Present not Declared for Prescription Verification   Hydrocodone                     340          UNEXPECTED ng/mg creat   Dihydrocodeine                 32           UNEXPECTED ng/mg creat   Norhydrocodone                 403          UNEXPECTED ng/mg creat    Sources of hydrocodone  include scheduled prescription medications.    Dihydrocodeine and norhydrocodone are expected metabolites of    hydrocodone . Dihydrocodeine is also available as a scheduled    prescription  medication.  ==================================================================== Test                      Result    Flag   Units      Ref Range   Creatinine              208              mg/dL      >=79 ==================================================================== Declared Medications:  The flagging and interpretation on this report are based on the  following declared medications.  Unexpected results may arise from  inaccuracies in the declared medications.   **Note: The testing scope of this panel includes these medications:   Oxycodone  (Roxicodone )   **Note: The testing scope of this panel does not include the  following reported medications:   Amlodipine  (Norvasc )  Aspirin  Calcium   Chondroitin  Ciprofloxacin  (Cipro )  Citalopram  (Celexa )  Cyclobenzaprine  (Flexeril )  Donepezil  (Aricept )  Erythromycin  Gabapentin  (Neurontin )  Glucosamine  Hydrocortisone  (Anusol -HC)  Letrozole  (Femara )  Losartan  (Cozaar )  Lubiprostone  (Amitiza )  Magnesium  (Mag-Ox)  Melatonin  Metoprolol  (Toprol )  Mirabegron  (  Myrbetriq )  Multivitamin  Naloxone  (Narcan )  Nitrofurantoin  (Macrobid )  Omega-3 Fatty Acids  Pantoprazole  (Protonix )  Rosuvastatin  (Crestor ) ==================================================================== For clinical consultation, please call [phone]. ====================================================================       ROS  Constitutional: Denies any fever or chills Gastrointestinal: No reported hemesis, hematochezia, vomiting, or acute GI distress Musculoskeletal: Denies any acute onset joint swelling, redness, loss of ROM, or weakness Neurological: No reported episodes of acute onset apraxia, aphasia, dysarthria, agnosia, amnesia, paralysis, loss of coordination, or loss of consciousness  Medication Review  Glucosamine-Chondroitin, Omega-3, amLODipine , aspirin EC, calcium  carbonate, ciprofloxacin , citalopram , cyclobenzaprine , donepezil ,  gabapentin , hydrochlorothiazide , letrozole , losartan , lubiprostone , magnesium  oxide, melatonin, metoprolol  succinate, mirabegron  ER, multivitamin, naloxone , nitrofurantoin  (macrocrystal-monohydrate), oxyCODONE , pantoprazole , and rosuvastatin   History Review  Allergy: [name] is allergic to sulfa antibiotics and vesicare [solifenacin]. Drug: [name]  reports no history of drug use. Alcohol:  reports no history of alcohol use. Tobacco:  reports that she has never smoked. She has never been exposed to tobacco smoke. She has never used smokeless tobacco. Social: Ms. Nicklin  reports that she has never smoked. She has never been exposed to tobacco smoke. She has never used smokeless tobacco. She reports that she does not drink alcohol and does not use drugs. Medical:  has a past medical history of Acute postoperative pain (10/25/2017), Anemia, Arm pain (07/26/2015), Arthritis, Arthritis, degenerative (03/26/2014), Back pain (11/01/2013), Bladder infection (06/2018), Breast cancer (HCC), Breast cancer (HCC), CHEST PAIN (04/29/2010), Chronic cystitis, Cystocele (02/22/2013), Cystocele, midline (08/19/2013), Degeneration of intervertebral disc of lumbosacral region (03/26/2014), DYSPNEA (04/29/2010), Enthesopathy of hip (03/26/2014), GERD (gastroesophageal reflux disease), Hiatal hernia, HTN (hypertension), Hypokalemia (06/2018), Hyponatremia (06/2018), LBP (low back pain) (03/26/2014), Neck pain (11/01/2013), Parkinson disease (HCC), Pneumonia (06/2018), Sinusitis (02/07/2015), Skin lesions (07/12/2014), and Urinary incontinence. Surgical: [name]  has a past surgical history that includes Tonsillectomy and adenoidectomy (1979); Vesicovaginal fistula closure w/ TAH (1983); Breast surgery (1986); Breast enhancement surgery (1987); Breast implant removal; Breast implant removal (Right, 08/29/2012); Mastectomy (08/2012); Abdominal hysterectomy; Colonoscopy with propofol  (N/A, 09/13/2016); Breast biopsy (2013); and  Blepharoplasty. Family: family history includes Colon polyps in her father; Diabetes in her father; Parkinson's disease in her mother; Stroke in her father and mother.  Laboratory Chemistry Profile   Renal Lab Results  Component Value Date   BUN 30 (H) 12/05/2023   CREATININE 0.93 12/05/2023   BCR 20 06/11/2020   GFR 55.80 (L) 12/05/2023   GFRAA >60 07/27/2020   GFRNONAA 47 (L) 09/26/2023    Hepatic Lab Results  Component Value Date   AST 25 09/11/2023   ALT 19 09/11/2023   ALBUMIN 3.8 09/11/2023   ALKPHOS 32 (L) 09/11/2023    Electrolytes Lab Results  Component Value Date   NA 134 (L) 12/05/2023   K 4.3 12/05/2023   CL 100 12/05/2023   CALCIUM  9.0 12/05/2023   MG 2.2 07/17/2018    Bone Lab Results  Component Value Date   VD25OH 66.95 09/26/2022   25OHVITD1 42 07/17/2018   25OHVITD2 <1.0 07/17/2018   25OHVITD3 42 07/17/2018    Inflammation (CRP: Acute Phase) (ESR: Chronic Phase) Lab Results  Component Value Date   CRP 3 07/17/2018   ESRSEDRATE 27 07/17/2018         Note: Above Lab results reviewed.  Recent Imaging Review  MR KNEE RIGHT WO CONTRAST CLINICAL DATA:  Chronic knee pain. Erosive osteoarthritis suspected. Degenerative disease on x-ray.  EXAM: MRI OF THE RIGHT KNEE WITHOUT CONTRAST  TECHNIQUE: Multiplanar, multisequence MR imaging of the knee  was performed. No intravenous contrast was administered.  COMPARISON:  MRI right knee 01/04/2022, right knee radiographs 03/15/2021  FINDINGS: MENISCI  Medial meniscus: There is mildly worsened intermediate proton density signal intrasubstance degeneration within the posterior horn of the medial meniscus. There is new vertically oriented linear increased signal indicating a tear extending through the superior and inferior articular surfaces of the junction of the middle and central thirds of the meniscal triangle of the posterior horn of the medial meniscus (sagittal series 12, image 20). This  connects to a new oblique undersurface tear extending through the inferior articular surface of the junction of middle and central thirds of the meniscal triangle (sagittal images 21 and 22).  Lateral meniscus:  Intact.  LIGAMENTS  Cruciates: The ACL and PCL are intact.  Collaterals: The medial collateral ligament is intact. The fibular collateral ligament, biceps femoris tendon, iliotibial band, and popliteus tendon are intact.  CARTILAGE  Patellofemoral: Diffuse full-thickness cartilage loss throughout the majority of the medial patellar facet and medial trochlea, similar to prior. Moderate thinning of the lateral patellar facet cartilage, greatest inferiorly.  Medial: Moderate to high-grade thinning of the weight-bearing medial femoral condyle cartilage, greatest anteriorly where there is focal full-thickness cartilage loss (coronal series 10, image 16) this appears mildly worsened from prior.  Lateral: Moderate thinning of the mid to medial aspect of the lateral tibial plateau cartilage. The prior full-thickness fissure in this region is less well visualized but there is a larger region of cartilage thinning (coronal series 10 images 12 and 13). Mild-to-moderate thinning of the adjacent weight-bearing lateral femoral condyle cartilage.  Joint: Small to moderate joint effusion, mildly increased from prior. Normal Hoffa's fat pad. No plical thickening.  Popliteal Fossa:  No Baker's cyst.  Extensor Mechanism: Intact quadriceps tendon insertion. Minimal intermediate T2 signal within the deep proximal aspect of the patellar tendon, minimal tendinosis that is new from prior. The medial and lateral patellofemoral retinacula are intact.  Bones:  No acute fracture or dislocation.  Other: None.  IMPRESSION: Compared to 01/04/2022:  1. New tiny vertically oriented tear extending through the superior and inferior articular surfaces of the junction of the middle  and central thirds of the meniscal triangle of the posterior horn of the medial meniscus (seen on a single sagittal image only. This connects to a new oblique undersurface tear extending through the inferior articular surface of the junction of middle and central thirds of the meniscal triangle of the more medial aspect of the posterior horn. 2. Tricompartmental cartilage degenerative changes, greatest within the medial patellofemoral compartment where there is diffuse full-thickness cartilage loss throughout the majority of the medial patellar facet and medial trochlea, similar to prior. 3. Small to moderate joint effusion, mildly increased from prior. 4. Minimal proximal patellar tendinosis, new from prior.  Electronically Signed   By: Tanda Lyons M.D.   On: 11/29/2023 09:45 Note: Reviewed        Physical Exam  General appearance: Well nourished, well developed, and well hydrated. In no apparent acute distress Mental status: Alert, oriented x 3 (person, place, & time)       Respiratory: No evidence of acute respiratory distress Eyes: PERLA Vitals: BP 123/67   Pulse (!) 56   Temp 97.9 F (36.6 C)   Resp 16   Ht 5' 3 (1.6 m)   Wt 159 lb (72.1 kg)   LMP 12/18/1981   SpO2 99%   BMI 28.17 kg/m  BMI: Estimated body mass index is 28.17 kg/m  as calculated from the following:   Height as of this encounter: 5' 3 (1.6 m).   Weight as of this encounter: 159 lb (72.1 kg). Ideal: Ideal body weight: 52.4 kg (115 lb 8.3 oz) Adjusted ideal body weight: 60.3 kg (132 lb 14.6 oz)  Assessment   Diagnosis Status  1. Chronic knee pain (Right)   2. Pes anserine bursitis (Right)   3. Hoffa's fat pad disease (HCC) (Right)   4. Hoffa's pad syndrome (HCC) (Right)   5. Infrapatellar bursitis of knee (Right)   6. Knee hemarthrosis (Right)   7. Osteoarthritis of knee (Right)   8. Patellofemoral joint pain (Right)   9. Tricompartment osteoarthritis of knee (Right)   10. Postop check    11. Abnormal MRI, knee (Right) (01/04/2022)   12. Chronic low back pain (1ry area of Pain) (Bilateral) (R>L) w/o sciatica   13. Lumbar facet joint pain   14. Lumbar facet syndrome (Bilateral) (R>L)   15. Low back pain of over 3 months duration   16. Spondylosis without myelopathy or radiculopathy, lumbosacral region    Controlled Controlled Controlled   Updated Problems: Problem  Abnormal MRI, knee (Right) (11/29/2023)   FINDINGS: MENISCI   Medial meniscus: There is mildly worsened intermediate proton density signal intrasubstance degeneration within the posterior horn of the medial meniscus. There is new vertically oriented linear increased signal indicating a tear extending through the superior and inferior articular surfaces of the junction of the middle and central thirds of the meniscal triangle of the posterior horn of the medial meniscus (sagittal series 12, image 20). This connects to a new oblique undersurface tear extending through the inferior articular surface of the junction of middle and central thirds of the meniscal triangle (sagittal images 21 and 22).   Lateral meniscus:  Intact.   LIGAMENTS   Cruciates: The ACL and PCL are intact.   Collaterals: The medial collateral ligament is intact. The fibular collateral ligament, biceps femoris tendon, iliotibial band, and popliteus tendon are intact.   CARTILAGE   Patellofemoral: Diffuse full-thickness cartilage loss throughout the majority of the medial patellar facet and medial trochlea, similar to prior. Moderate thinning of the lateral patellar facet cartilage, greatest inferiorly.   Medial: Moderate to high-grade thinning of the weight-bearing medial femoral condyle cartilage, greatest anteriorly where there is focal full-thickness cartilage loss (coronal series 10, image 16) this appears mildly worsened from prior.   Lateral: Moderate thinning of the mid to medial aspect of the lateral tibial plateau  cartilage. The prior full-thickness fissure in this region is less well visualized but there is a larger region of cartilage thinning (coronal series 10 images 12 and 13). Mild-to-moderate thinning of the adjacent weight-bearing lateral femoral condyle cartilage.   Joint: Small to moderate joint effusion, mildly increased from prior. Normal Hoffa's fat pad. No plical thickening.   Popliteal Fossa:  No Baker's cyst.   Extensor Mechanism: Intact quadriceps tendon insertion. Minimal intermediate T2 signal within the deep proximal aspect of the patellar tendon, minimal tendinosis that is new from prior. The medial and lateral patellofemoral retinacula are intact.   Bones:  No acute fracture or dislocation.   Other: None.   IMPRESSION: Compared to 01/04/2022:   1. New tiny vertically oriented tear extending through the superior and inferior articular surfaces of the junction of the middle and central thirds of the meniscal triangle of the posterior horn of the medial meniscus (seen on a single sagittal image only. This connects to a new oblique undersurface  tear extending through the inferior articular surface of the junction of middle and central thirds of the meniscal triangle of the more medial aspect of the posterior horn. 2. Tricompartmental cartilage degenerative changes, greatest within the medial patellofemoral compartment where there is diffuse full-thickness cartilage loss throughout the majority of the medial patellar facet and medial trochlea, similar to prior. 3. Small to moderate joint effusion, mildly increased from prior. 4. Minimal proximal patellar tendinosis, new from prior.     Electronically Signed   By: Tanda Lyons M.D.   On: 11/29/2023 09:45  (01/04/2022) RIGHT KNEE MRI FINDINGS: MENISCI Medial: Degeneration of the posterior horn of the medial meniscus.  CARTILAGE Patellofemoral: High-grade partial-thickness cartilage loss of the patellofemoral  compartment. Medial: Mild partial-thickness cartilage loss of the medial femorotibial compartment. Lateral: Mild partial-thickness cartilage loss of the lateral femorotibial compartment with a small full-thickness cartilage fissure of the lateral tibial plateau. JOINT: Moderate joint effusion. Mild edema in Hoffa's fat. No plical thickening.  IMPRESSION: 1. Tricompartmental cartilage abnormalities as described above most severe in the patellofemoral compartment.     Plan of Care  Problem-specific:  Assessment and Plan    Right Knee Pain with Meniscal Tear and Tricompartmental Osteoarthritis   She reports a 50% improvement in right knee pain following an injection, with pain localized to the medial aspect. An MRI dated November 23, 2023, reveals a new vertical tear in the posterior horn of the medial meniscus and tricompartmental osteoarthritis, severely affecting the medial compartment. She is a potential candidate for knee replacement surgery to enhance gait and relieve back pain. We will refer her to an orthopedic surgeon for knee evaluation and discuss the possibility of knee replacement surgery.  Chronic Lower Back Pain   She reports bilateral lower back pain, with previous lumbar facet blocks providing 70% relief. The right calf pain was alleviated by the knee injection, indicating a knee-related origin. Treatment for the back will be prioritized over the shoulder due to the severity. We will schedule a lumbar facet block procedure.  Chronic Shoulder Pain   She reports persistent shoulder pain and is advised to discuss this issue with the orthopedic surgeon during her knee evaluation.  Medication Refill   She inquired about medication refills and was informed that refills are available until February 12, 2024. We will ensure medication refills are available until this date.  Follow-up   She will follow up with the orthopedic surgeon for an evaluation of both knee and shoulder.        [name] has a current medication list which includes the following long-term medication(s): amlodipine , calcium  carbonate, citalopram , citalopram , cyclobenzaprine , donepezil , gabapentin , losartan , magnesium  oxide, metoprolol  succinate, mirabegron  er, oxycodone , [START ON 01/13/2024] oxycodone , pantoprazole , rosuvastatin , and oxycodone .  Pharmacotherapy (Medications Ordered): No orders of the defined types were placed in this encounter.  Orders:  Orders Placed This Encounter  Procedures   LUMBAR FACET(MEDIAL BRANCH NERVE BLOCK) MBNB    Diagnosis: Lumbar Facet Syndrome (M47.816); Lumbosacral Facet Syndrome (M47.817); Lumbar Facet Joint Pain (M54.59) Medical Necessity Statement: 1.Severe chronic axial low back pain causing functional impairment documented by ongoing pain scale assessments. 2.Pain present for longer than 3 months (Chronic) documented to have failed noninvasive conservative therapies. 3.Absence of untreated radiculopathy. 4.There is no radiological evidence of untreated fractures, tumor, infection, or deformity.  Physical Examination Findings: Positive Kemp Maneuver: (Y)  Positive Lumbar Hyperextension-Rotation provocative test: (Y)    Standing Status:   Future    Expiration Date:   03/24/2024  Scheduling Instructions:     Procedure: Lumbar facet Block     Type: Medial Branch Block     Side: Bilateral     Purpose: Palliative     Level(s): L3-4, L4-5, and L5-S1 Facets (L2, L3, L4, L5, and S1 Medial Branch)     Sedation: With Sedation.     Timeframe: ASAA    Where will this procedure be performed?:   [facility]   Ambulatory referral to Orthopedics    Referral Priority:   Routine    Referral Type:   Consultation    Referral Reason:   Specialty Services Required    Number of Visits Requested:   1   Nursing Instructions:    Please complete this patient's postprocedure evaluation.    Scheduling Instructions:     Please complete this patient's  postprocedure evaluation.   Follow-up plan:   Return for (ECT): (B) L-FCT Blk #8 (1st of 2025).      Interventional Therapies  Risk Factors  Considerations  Medical Comorbidities:  Parkinson's disease  Hx right breast cancer  OIC  SOB  CKD Stage 3b  GERD  depression  osteopenia/osteoporosis  PSVT  Memory impairment     Planned  Pending:   Palliative bilateral lumbar facet MBB #8  Therapeutic right IA Zilretta  knee injection #3 + Pes anserine bursa + Hoffa's pad inj. #1 (11/13/2023)    Under consideration:  Therapeutic right sided suprascapular nerve RFA #1    Completed:   Diagnostic/therapeutic right suprascapular NB x2 (10/16/2023) (100/100/75 x 5 days/0) Therapeutic right trapezius TPI x4 (07/17/2023) (100/100/0/0)  Therapeutic right thoracic TPI x2 (10/17/2018) (DKFUA)  Therapeutic right QL & ESM TPI x1 (02/12/2018)   Diagnostic right SI joint Blk x3 (12/21/2020)  (DKFUA)  Therapeutic right SI joint RFA x4 (03/01/2023) (100/100/70/70)   Therapeutic right lumbar facet MBB (FCT: L3-4,L4-5,L5-S1) x5 (08/09/2021) (100/100/50)  Therapeutic right lumbar facet MBB (FCT: L4-5, L5-S1) x7 (11/01/2023) (100/100/70/R:70)  Therapeutic right lumbar facet MBB (FCT: L5-S1) x1 (08/29/2022) (100/100/100/0)  Therapeutic right lumbar facet RFA x4 (03/01/2023) (100/100/70/70)   Therapeutic bilateral L5 and L2 TFESI x1 (07/03/2023) (100/90/50/L:100R:20))  Therapeutic right L3 and L4 TFESI x1 (07/27/2022) (100/100/75/50)  Therapeutic right L5 TFESI x4 (02/14/2022) (100/100/70/75)  Therapeutic right S1 TFESI x1 (11/04/2020) (100/100/20/<50)   Therapeutic right L3-4 LESI x2 (11/04/2020) (100/100/20/<50)  Therapeutic right L4-5 LESI x2 (03/30/2022) (100/100/75/75)  Therapeutic right L5-S1 LESI x2 (12/04/2019) (100/100/50/75)  Therapeutic midline L2-3 LESI x1 (10/04/2017) (100/100/85/>50)   Diagnostic bilateral superficial trochanteric bursa injec. x1 (04/30/2018) (DKFUA)  Diagnostic right  IA hip injection x1 (02/13/2019) (DKFUA)   Diagnostic/therapeutic right IA steroid knee inj. x5 (07/31/2023) (100/100/100/80)  Therapeutic right (Zilretta ) knee injection x3 (11/13/2023)  Therapeutic right (Monovisc) knee injection x1 (06/07/2021) (100/100/100/75)   Therapeutic left thoracic TPI x1 (10/17/2018) (DKFUA)  Diagnostic left SI joint Blk x2 (04/12/2023) (100/90/20/<25)   Therapeutic left lumbar facet MBB (L2-S1) (FCT: L3-4,L4-5,L5-S1) x5 (08/09/2021) (100/100/50)  Therapeutic left lumbar facet MBB (FCT: L4-5, L5-S1) x7 (11/01/2023) (100/100/70/L: 85)  Therapeutic left lumbar facet MBB (L4-S1) (FCT: L5-S1) x1 (08/29/2022) (100/100/100/100)  Therapeutic left lumbar facet RFA x2 (11/30/2022) (100/60/90/>75)    Therapeutic  Palliative (PRN) options:   Do not schedule any procedures without a proper preprocedure evaluation.   Completed by other providers:   Therapeutic right knee (ORTHOVISC) inj. x3 (05/18/2023, 05/23/2023, 05/30/2023) by Arthea Peggye Sheer, MD [facility]) Therapeutic left knee (Kenalog ) inj. x1 (08/25/2020) by Earnestine Leverette Blanch, MD [facility])   Pharmacotherapy  Nonopioids transfer 12/21/2020: Gabapentin        Recent Visits Date Type Provider Dept  12/06/23 Procedure visit Tanya Glisson, MD [facility]  11/28/23 Office Visit Tanya Glisson, MD [facility]  11/13/23 Procedure visit Tanya Glisson, MD [facility]  11/12/23 Office Visit Tanya Glisson, MD [facility]  11/01/23 Procedure visit Tanya Glisson, MD [facility]  10/16/23 Procedure visit Tanya Glisson, MD [facility]  Showing recent visits within past 90 days and meeting all other requirements Today's Visits Date Type Provider Dept  12/25/23 Office Visit Tanya Glisson, MD [facility]  Showing today's visits and meeting all other requirements Future Appointments Date Type Provider Dept  02/11/24  Appointment Tanya Glisson, MD [facility]  Showing future appointments within next 90 days and meeting all other requirements  I discussed the assessment and treatment plan with the patient. The patient was provided an opportunity to ask questions and all were answered. The patient agreed with the plan and demonstrated an understanding of the instructions.  Patient advised to call back or seek an in-person evaluation if the symptoms or condition worsens.  Duration of encounter: 30 minutes.  Total time on encounter, as per AMA guidelines included both the face-to-face and non-face-to-face time personally spent by the physician and/or other qualified health care professional(s) on the day of the encounter (includes time in activities that require the physician or other qualified health care professional and does not include time in activities normally performed by clinical staff). Physician's time may include the following activities when performed: Preparing to see the patient (e.g., pre-charting review of records, searching for previously ordered imaging, lab work, and nerve conduction tests) Review of prior analgesic pharmacotherapies. Reviewing PMP Interpreting ordered tests (e.g., lab work, imaging, nerve conduction tests) Performing post-procedure evaluations, including interpretation of diagnostic procedures Obtaining and/or reviewing separately obtained history Performing a medically appropriate examination and/or evaluation Counseling and educating the patient/family/caregiver Ordering medications, tests, or procedures Referring and communicating with other health care professionals (when not separately reported) Documenting clinical information in the electronic or other health record Independently interpreting results (not separately reported) and communicating results to the patient/ family/caregiver Care coordination (not separately reported)  Note by: Glisson DELENA Tanya, MD Date: 12/25/2023; Time: 1:49 PM

## 2023-12-25 ENCOUNTER — Ambulatory Visit: Payer: Medicare Other | Attending: Pain Medicine | Admitting: Pain Medicine

## 2023-12-25 ENCOUNTER — Encounter: Payer: Self-pay | Admitting: Pain Medicine

## 2023-12-25 VITALS — BP 123/67 | HR 56 | Temp 97.9°F | Resp 16 | Ht 63.0 in | Wt 159.0 lb

## 2023-12-25 DIAGNOSIS — R936 Abnormal findings on diagnostic imaging of limbs: Secondary | ICD-10-CM | POA: Diagnosis not present

## 2023-12-25 DIAGNOSIS — M5459 Other low back pain: Secondary | ICD-10-CM

## 2023-12-25 DIAGNOSIS — E8889 Other specified metabolic disorders: Secondary | ICD-10-CM | POA: Diagnosis not present

## 2023-12-25 DIAGNOSIS — M25061 Hemarthrosis, right knee: Secondary | ICD-10-CM

## 2023-12-25 DIAGNOSIS — M47817 Spondylosis without myelopathy or radiculopathy, lumbosacral region: Secondary | ICD-10-CM | POA: Diagnosis not present

## 2023-12-25 DIAGNOSIS — M1711 Unilateral primary osteoarthritis, right knee: Secondary | ICD-10-CM

## 2023-12-25 DIAGNOSIS — M47816 Spondylosis without myelopathy or radiculopathy, lumbar region: Secondary | ICD-10-CM | POA: Diagnosis not present

## 2023-12-25 DIAGNOSIS — M545 Low back pain, unspecified: Secondary | ICD-10-CM

## 2023-12-25 DIAGNOSIS — M7051 Other bursitis of knee, right knee: Secondary | ICD-10-CM

## 2023-12-25 DIAGNOSIS — M7050 Other bursitis of knee, unspecified knee: Secondary | ICD-10-CM | POA: Diagnosis not present

## 2023-12-25 DIAGNOSIS — M25561 Pain in right knee: Secondary | ICD-10-CM | POA: Diagnosis not present

## 2023-12-25 DIAGNOSIS — Z09 Encounter for follow-up examination after completed treatment for conditions other than malignant neoplasm: Secondary | ICD-10-CM

## 2023-12-25 DIAGNOSIS — G8929 Other chronic pain: Secondary | ICD-10-CM | POA: Diagnosis not present

## 2023-12-25 NOTE — Progress Notes (Signed)
 Safety precautions to be maintained throughout the outpatient stay will include: orient to surroundings, keep bed in low position, maintain call bell within reach at all times, provide assistance with transfer out of bed and ambulation.

## 2023-12-25 NOTE — Patient Instructions (Signed)

## 2023-12-26 ENCOUNTER — Telehealth: Payer: Self-pay

## 2023-12-26 NOTE — Telephone Encounter (Signed)
 Copied from CRM [id number]. Topic: Clinical - Prescription Issue >> Dec 26, 2023 12:34 PM Sonny Dandy B wrote: Reason for CRM: [employer] requesting a new prescription with Direction for Gabapiten 300 mg. Please call Efraim Kaufmann [phone]

## 2023-12-28 ENCOUNTER — Other Ambulatory Visit: Payer: Self-pay | Admitting: Internal Medicine

## 2023-12-28 MED ORDER — GABAPENTIN 300 MG PO CAPS: 300.0000 mg | ORAL_CAPSULE | Freq: Two times a day (BID) | ORAL | 1 refills | Status: DC

## 2023-12-28 NOTE — Telephone Encounter (Signed)
 Copied from CRM [id number]. Topic: Clinical - Medication Refill >> Dec 28, 2023  9:27 AM Franky GRADE wrote: Most Recent Primary Care Visit:  Provider: SCOTT, CHARLENE  Department: [facility]  Visit Type: OFFICE VISIT  Date: 12/05/2023  Medication: gabapentin  (NEURONTIN ) 300 MG capsule  Has the patient contacted their pharmacy? Yes, the pharmacy has sent over several request but have not heard anything back (Agent: If no, request that the patient contact the pharmacy for the refill. If patient does not wish to contact the pharmacy document the reason why and proceed with request.) (Agent: If yes, when and what did the pharmacy advise?)  Is this the correct pharmacy for this prescription? Yes If no, delete pharmacy and type the correct one.  This is the patient's preferred pharmacy:  [facility] - [place], [place] - [address] [place] [address] Phone: [phone] Fax: [phone]   Has the prescription been filled recently? No  Is the patient out of the medication? Yes  Has the patient been seen for an appointment in the last year OR does the patient have an upcoming appointment? Yes  Can we respond through MyChart? No  Agent: Please be advised that Rx refills may take up to 3 business days. We ask that you follow-up with your pharmacy.

## 2023-12-28 NOTE — Addendum Note (Signed)
 Addended by: Rita Ohara D on: 12/28/2023 02:16 PM   Modules accepted: Orders

## 2023-12-28 NOTE — Telephone Encounter (Signed)
 Called pharmacy to clarify. Pharmacy says that patient has been taking gabapentin  300 mg bid but last rx was sent for 300 mg at bedtime.  Patient says that she has been taking gabapentin  300 mg BID for a very long time. She needs new rx. OK to send in rx with new directions?

## 2023-12-28 NOTE — Telephone Encounter (Signed)
 If she has been taking bid and tolerating and if I have been seeing her, ok to send in bid

## 2023-12-31 NOTE — Telephone Encounter (Signed)
Rx sent in, patient is aware. 

## 2023-12-31 NOTE — Telephone Encounter (Signed)
 Copied from CRM [id number]. Topic: Clinical - Prescription Issue >> Dec 28, 2023 12:39 PM Montie POUR wrote: Reason for CRM: Amyiah called and she is out of gabapentin  (NEURONTIN ) 300 MG capsule. She has called the pharmacy and they have not received anything from doctor. She goes to [facility]. Call back [phone]. She wants to pick this up before the snow comes.

## 2024-01-04 ENCOUNTER — Ambulatory Visit (INDEPENDENT_AMBULATORY_CARE_PROVIDER_SITE_OTHER): Payer: Medicare Other | Admitting: Internal Medicine

## 2024-01-04 VITALS — BP 116/68 | HR 63 | Temp 98.0°F | Resp 16 | Ht 63.0 in | Wt 162.0 lb

## 2024-01-04 DIAGNOSIS — D649 Anemia, unspecified: Secondary | ICD-10-CM | POA: Diagnosis not present

## 2024-01-04 DIAGNOSIS — E7800 Pure hypercholesterolemia, unspecified: Secondary | ICD-10-CM | POA: Diagnosis not present

## 2024-01-04 DIAGNOSIS — R739 Hyperglycemia, unspecified: Secondary | ICD-10-CM

## 2024-01-04 DIAGNOSIS — I471 Supraventricular tachycardia, unspecified: Secondary | ICD-10-CM | POA: Diagnosis not present

## 2024-01-04 DIAGNOSIS — M810 Age-related osteoporosis without current pathological fracture: Secondary | ICD-10-CM

## 2024-01-04 DIAGNOSIS — R2681 Unsteadiness on feet: Secondary | ICD-10-CM

## 2024-01-04 DIAGNOSIS — F320 Major depressive disorder, single episode, mild: Secondary | ICD-10-CM

## 2024-01-04 DIAGNOSIS — Z853 Personal history of malignant neoplasm of breast: Secondary | ICD-10-CM

## 2024-01-04 DIAGNOSIS — N1831 Chronic kidney disease, stage 3a: Secondary | ICD-10-CM

## 2024-01-04 DIAGNOSIS — I10 Essential (primary) hypertension: Secondary | ICD-10-CM

## 2024-01-04 DIAGNOSIS — R251 Tremor, unspecified: Secondary | ICD-10-CM

## 2024-01-04 DIAGNOSIS — M1711 Unilateral primary osteoarthritis, right knee: Secondary | ICD-10-CM | POA: Diagnosis not present

## 2024-01-04 DIAGNOSIS — I700 Atherosclerosis of aorta: Secondary | ICD-10-CM | POA: Diagnosis not present

## 2024-01-04 NOTE — Progress Notes (Unsigned)
Subjective:    Patient ID: Wanda Martinez, female    DOB: 1935/06/10, 89 y.o.   MRN: [id number]  Patient here for  Chief Complaint  Patient presents with  . Medical Management of Chronic Issues    HPI Here for a scheduled follow up - here to follow up regarding hypertension and hypercholesterolemia.    Past Medical History:  Diagnosis Date  . Acute postoperative pain 10/25/2017  . Anemia   . Arm pain 07/26/2015  . Arthritis   . Arthritis, degenerative 03/26/2014  . Back pain 11/01/2013  . Bladder infection 06/2018  . Breast cancer (HCC)    Masectomy - left - 1986   . Breast cancer [facility])    Mastectomy-right -2014  . CHEST PAIN 04/29/2010   Qualifier: Diagnosis of  By: Laural Benes RN, Cicero Duck    . Chronic cystitis   . Cystocele 02/22/2013  . Cystocele, midline 08/19/2013  . Degeneration of intervertebral disc of lumbosacral region 03/26/2014  . DYSPNEA 04/29/2010   Qualifier: Diagnosis of  By: Laural Benes RN, Cicero Duck    . Enthesopathy of hip 03/26/2014  . GERD (gastroesophageal reflux disease)   . Hiatal hernia   . HTN (hypertension)   . Hypokalemia 06/2018  . Hyponatremia 06/2018  . LBP (low back pain) 03/26/2014  . Neck pain 11/01/2013  . Parkinson disease (HCC)   . Pneumonia 06/2018  . Sinusitis 02/07/2015  . Skin lesions 07/12/2014  . Urinary incontinence    mixed    Past Surgical History:  Procedure Laterality Date  . ABDOMINAL HYSTERECTOMY    . BLEPHAROPLASTY    . BREAST BIOPSY  2013  . BREAST ENHANCEMENT SURGERY  1987  . BREAST IMPLANT REMOVAL    . BREAST IMPLANT REMOVAL Right 08/29/2012  . BREAST SURGERY  1986   s/p left mastectomy  . COLONOSCOPY WITH PROPOFOL N/A 09/13/2016   Procedure: COLONOSCOPY WITH PROPOFOL;  Surgeon: Scot Jun, MD;  Location: [facility] END[facility]COPY;  Service: Endoscopy;  Laterality: N/A;  . MASTECTOMY  08/2012   right  . TONSILLECTOMY AND ADENOIDECTOMY  1979  . VESICOVAGINAL FISTULA CLOSURE W/ TAH  1983   Family History  Problem Relation Age  of Onset  . Diabetes Father   . Stroke Father   . Colon polyps Father   . Stroke Mother   . Parkinson's disease Mother    Social History   Socioeconomic History  . Marital status: Married    Spouse name: Not on file  . Martinez of children: 3  . Years of education: Not on file  . Highest education level: Not on file  Occupational History  . Not on file  Tobacco Use  . Smoking status: Never    Passive exposure: Never  . Smokeless tobacco: Never  . Tobacco comments:    tobacco use - no  Vaping Use  . Vaping status: Never Used  Substance and Sexual Activity  . Alcohol use: No    Alcohol/week: 0.0 standard drinks of alcohol  . Drug use: No  . Sexual activity: Never  Other Topics Concern  . Not on file  Social History Narrative  . Not on file   Social Drivers of Health   Financial Resource Strain: Low Risk  (02/26/2023)   Received from [facility]   Overall Financial Resource Strain (CARDIA)   . Difficulty of Paying Living Expenses: Not hard at all  Food Insecurity: No Food Insecurity (09/03/2023)   Hunger Vital Sign   . Worried  About Running Out of Food in the Last Year: Never true   . Ran Out of Food in the Last Year: Never true  Transportation Needs: No Transportation Needs (09/03/2023)   PRAPARE - Transportation   . Lack of Transportation (Medical): No   . Lack of Transportation (Non-Medical): No  Physical Activity: Not on file  Stress: No Stress Concern Present (07/12/2020)   [employer] of Occupational Health - Occupational Stress Questionnaire   . Feeling of Stress : Not at all  Social Connections: Socially Integrated (07/12/2020)   Social Connection and Isolation Panel [NHANES]   . Frequency of Communication with Friends and Family: Three times a week   . Frequency of Social Gatherings with Friends and Family: More than three times a week   . Attends Religious Services: More than 4 times per year   . Active Member of Clubs or  Organizations: Yes   . Attends Banker Meetings: 1 to 4 times per year   . Marital Status: Married     Review of Systems     Objective:     BP 116/68   Pulse 63   Temp 98 F (36.7 C)   Resp 16   Ht 5\' 3"  (1.6 m)   Wt 162 lb (73.5 kg)   LMP 12/18/1981   SpO2 98%   BMI 28.70 kg/m  Wt Readings from Last 3 Encounters:  01/04/24 162 lb (73.5 kg)  12/25/23 159 lb (72.1 kg)  12/06/23 162 lb (73.5 kg)    Physical Exam  {Perform Simple Foot Exam  Perform Detailed exam:1} {Insert foot Exam (Optional):30965}   Outpatient Encounter Medications as of 01/04/2024  Medication Sig  . amLODipine (NORVASC) 2.5 MG tablet Take 1 tablet (2.5 mg total) by mouth daily.  Marland Kitchen aspirin 81 MG EC tablet Take 81 mg by mouth daily as needed.  . calcium carbonate (OSCAL) 1500 (600 Ca) MG TABS tablet Take 1 tablet by mouth daily.  . ciprofloxacin (CIPRO) 500 MG tablet Take 500 mg by mouth 2 (two) times daily.  . citalopram (CELEXA) 10 MG tablet Take 10 mg by mouth daily.  . citalopram (CELEXA) 20 MG tablet TAKE 1 TABLET BY MOUTH ONCE DAILY.  . cyclobenzaprine (FLEXERIL) 10 MG tablet TAKE ONE TABLET BY MOUTH AT BEDTIME.  Marland Kitchen donepezil (ARICEPT) 10 MG tablet TAKE ONE TABLET BY MOUTH AT BEDTIME  . gabapentin (NEURONTIN) 300 MG capsule Take 1 capsule (300 mg total) by mouth 2 (two) times daily.  . Glucosamine-Chondroitin 500-400 MG CAPS Take 2 capsules by mouth daily.  . hydrochlorothiazide (HYDRODIURIL) 12.5 MG tablet Take 12.5 mg by mouth every morning.  Marland Kitchen letrozole (FEMARA) 2.5 MG tablet Take 2.5 mg by mouth daily.  Marland Kitchen losartan (COZAAR) 50 MG tablet TAKE (2) TABLETS BY MOUTH ONCE DAILY.  Marland Kitchen lubiprostone (AMITIZA) 24 MCG capsule TAKE 1 CAPSULE BY MOUTH ONCE DAILY WITH BREAKFAST.  . magnesium oxide (MAG-OX) 400 (240 Mg) MG tablet TAKE ONE TABLET BY MOUTH EVERY DAY  . melatonin 1 MG TABS tablet Take 3 mg by mouth at bedtime.  . metoprolol succinate (TOPROL-XL) 25 MG 24 hr tablet Take 1 tablet  (25 mg total) by mouth 2 (two) times daily.  . mirabegron ER (MYRBETRIQ) 50 MG TB24 tablet Take 1 tablet (50 mg total) by mouth daily.  . Multiple Vitamin (MULTIVITAMIN) tablet Take 1 tablet by mouth daily.  . naloxone (NARCAN) nasal spray 4 mg/0.1 mL Place 1 spray into the nose as needed for up to 365  doses (for opioid-induced respiratory depresssion). In case of emergency (overdose), spray once into each nostril. If no response within 3 minutes, repeat application and call 911.  . nitrofurantoin, macrocrystal-monohydrate, (MACROBID) 100 MG capsule Take 100 mg by mouth 2 (two) times daily.  . Omega-3 1000 MG CAPS Take by mouth.  . oxyCODONE (OXY IR/ROXICODONE) 5 MG immediate release tablet Take 1 tablet (5 mg total) by mouth every 6 (six) hours as needed for severe pain (pain score 7-10) or breakthrough pain (PRN for post-radiofrequency pain). Must last 30 days.  Marland Kitchen oxyCODONE (OXY IR/ROXICODONE) 5 MG immediate release tablet Take 1 tablet (5 mg total) by mouth every 6 (six) hours as needed for severe pain (pain score 7-10) or breakthrough pain (PRN for post-radiofrequency pain). Must last 30 days.  Melene Muller ON 01/13/2024] oxyCODONE (OXY IR/ROXICODONE) 5 MG immediate release tablet Take 1 tablet (5 mg total) by mouth every 6 (six) hours as needed for severe pain (pain score 7-10) or breakthrough pain (PRN for post-radiofrequency pain). Must last 30 days.  . pantoprazole (PROTONIX) 40 MG tablet TAKE ONE TABLET BY MOUTH EVERY DAY  . rosuvastatin (CRESTOR) 20 MG tablet TAKE ONE TABLET BY MOUTH AT BEDTIME   No facility-administered encounter medications on file as of 01/04/2024.     Lab Results  Component Value Date   WBC 6.5 12/20/2023   HGB 11.0 (L) 12/20/2023   HCT 34.5 (L) 12/20/2023   PLT 199 12/20/2023   GLUCOSE 86 12/05/2023   CHOL 112 09/11/2023   TRIG 96.0 09/11/2023   HDL 47.90 09/11/2023   LDLDIRECT 73.0 07/09/2018   LDLCALC 45 09/11/2023   ALT 19 09/11/2023   AST 25 09/11/2023   NA  134 (L) 12/05/2023   K 4.3 12/05/2023   CL 100 12/05/2023   CREATININE 0.93 12/05/2023   BUN 30 (H) 12/05/2023   CO2 29 12/05/2023   TSH 2.86 09/11/2023   HGBA1C 5.3 09/11/2023    MR KNEE RIGHT WO CONTRAST Result Date: 11/29/2023 CLINICAL DATA:  Chronic knee pain. Erosive osteoarthritis suspected. Degenerative disease on x-ray. EXAM: MRI OF THE RIGHT KNEE WITHOUT CONTRAST TECHNIQUE: Multiplanar, multisequence MR imaging of the knee was performed. No intravenous contrast was administered. COMPARISON:  MRI right knee 01/04/2022, right knee radiographs 03/15/2021 FINDINGS: MENISCI Medial meniscus: There is mildly worsened intermediate proton density signal intrasubstance degeneration within the posterior horn of the medial meniscus. There is new vertically oriented linear increased signal indicating a tear extending through the superior and inferior articular surfaces of the junction of the middle and central thirds of the meniscal triangle of the posterior horn of the medial meniscus (sagittal series 12, image 20). This connects to a new oblique undersurface tear extending through the inferior articular surface of the junction of middle and central thirds of the meniscal triangle (sagittal images 21 and 22). Lateral meniscus:  Intact. LIGAMENTS Cruciates: The ACL and PCL are intact. Collaterals: The medial collateral ligament is intact. The fibular collateral ligament, biceps femoris tendon, iliotibial band, and popliteus tendon are intact. CARTILAGE Patellofemoral: Diffuse full-thickness cartilage loss throughout the majority of the medial patellar facet and medial trochlea, similar to prior. Moderate thinning of the lateral patellar facet cartilage, greatest inferiorly. Medial: Moderate to high-grade thinning of the weight-bearing medial femoral condyle cartilage, greatest anteriorly where there is focal full-thickness cartilage loss (coronal series 10, image 16) this appears mildly worsened from prior.  Lateral: Moderate thinning of the mid to medial aspect of the lateral tibial plateau cartilage. The prior  full-thickness fissure in this region is less well visualized but there is a larger region of cartilage thinning (coronal series 10 images 12 and 13). Mild-to-moderate thinning of the adjacent weight-bearing lateral femoral condyle cartilage. Joint: Small to moderate joint effusion, mildly increased from prior. Normal Hoffa's fat pad. No plical thickening. Popliteal Fossa:  No Baker's cyst. Extensor Mechanism: Intact quadriceps tendon insertion. Minimal intermediate T2 signal within the deep proximal aspect of the patellar tendon, minimal tendinosis that is new from prior. The medial and lateral patellofemoral retinacula are intact. Bones:  No acute fracture or dislocation. Other: None. IMPRESSION: Compared to 01/04/2022: 1. New tiny vertically oriented tear extending through the superior and inferior articular surfaces of the junction of the middle and central thirds of the meniscal triangle of the posterior horn of the medial meniscus (seen on a single sagittal image only. This connects to a new oblique undersurface tear extending through the inferior articular surface of the junction of middle and central thirds of the meniscal triangle of the more medial aspect of the posterior horn. 2. Tricompartmental cartilage degenerative changes, greatest within the medial patellofemoral compartment where there is diffuse full-thickness cartilage loss throughout the majority of the medial patellar facet and medial trochlea, similar to prior. 3. Small to moderate joint effusion, mildly increased from prior. 4. Minimal proximal patellar tendinosis, new from prior. Electronically Signed   By: Neita Garnet M.D.   On: 11/29/2023 09:45       Assessment & Plan:  There are no diagnoses linked to this encounter.   Dale Sullivan, MD

## 2024-01-05 ENCOUNTER — Encounter: Payer: Self-pay | Admitting: Internal Medicine

## 2024-01-05 ENCOUNTER — Telehealth: Payer: Self-pay | Admitting: Internal Medicine

## 2024-01-05 NOTE — Assessment & Plan Note (Signed)
(  01/04/2022) RIGHT KNEE MRI FINDINGS: MENISCI Medial: Degeneration of the posterior horn of the medial meniscus.   CARTILAGE Patellofemoral: High-grade partial-thickness cartilage loss of the patellofemoral compartment. Medial: Mild partial-thickness cartilage loss of the medial femorotibial compartment. Lateral: Mild partial-thickness cartilage loss of the lateral femorotibial compartment with a small full-thickness cartilage fissure of the lateral tibial plateau. JOINT: Moderate joint effusion. Mild edema in Hoffa's fat. No plical thickening. She reports persistent issues with her knee. Request to f/u with Dr Audelia Acton.

## 2024-01-05 NOTE — Assessment & Plan Note (Signed)
Unsteady gait, weakness and balance issues. Discussed PT. Referral for PT to evaluate and treat - placed last visit.  Follow up to confirm scheduled.

## 2024-01-05 NOTE — Assessment & Plan Note (Signed)
Follow up - 06/2023 - History of paroxysmal SVT, echo 7/24 EF 55 to 60%.  Continue Toprol-XL 50 daily.

## 2024-01-05 NOTE — Assessment & Plan Note (Signed)
Continue crestor 

## 2024-01-05 NOTE — Assessment & Plan Note (Signed)
 Avoid antiinflammatory medication. Follow metabolic panel. Recheck today.

## 2024-01-05 NOTE — Assessment & Plan Note (Signed)
Hematology 04/2023 - retacrit.  F/u. Retacrit if her hemoglobin remains below 10.0.  follow cbc.

## 2024-01-05 NOTE — Assessment & Plan Note (Signed)
Low carb diet and exercise.  Follow met b and a1c.   

## 2024-01-05 NOTE — Assessment & Plan Note (Signed)
On crestor.  Follow lipid panel and liver function tests.   

## 2024-01-05 NOTE — Assessment & Plan Note (Signed)
Blood pressure as outlined. On metoprolol, amlodipine and losartan. Follow pressures.  Follow metabolic panel.

## 2024-01-05 NOTE — Assessment & Plan Note (Signed)
zometa - per Dr Grayland Ormond

## 2024-01-05 NOTE — Assessment & Plan Note (Signed)
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  Patient completed 5 years of letrozole in May 2019.  No further interventions are needed.  Patient has had bilateral mastectomies, therefore no further mammograms are necessary.  ?

## 2024-01-05 NOTE — Assessment & Plan Note (Signed)
Request f/u with Dr Malvin Johns.

## 2024-01-05 NOTE — Telephone Encounter (Signed)
Needs a f/u appt with Dr Malvin Johns.  Tremor, unsteady gait.  Has seen.  Needs f/u appt.

## 2024-01-05 NOTE — Assessment & Plan Note (Signed)
Continue citalopram.  Overall appears to be handling things well.  Follow.   

## 2024-01-09 NOTE — Telephone Encounter (Signed)
Left appt info on patients answering machine,.

## 2024-01-15 ENCOUNTER — Ambulatory Visit
Admission: RE | Admit: 2024-01-15 | Discharge: 2024-01-15 | Disposition: A | Discharge status: Home / Self Care | Payer: Medicare Other | Source: Ambulatory Visit | Attending: Pain Medicine | Admitting: Pain Medicine

## 2024-01-15 ENCOUNTER — Ambulatory Visit: Payer: Medicare Other | Attending: Pain Medicine | Admitting: Pain Medicine

## 2024-01-15 ENCOUNTER — Encounter: Payer: Self-pay | Admitting: Pain Medicine

## 2024-01-15 VITALS — BP 165/76 | HR 54 | Temp 97.7°F | Resp 16 | Ht 63.0 in | Wt 162.0 lb

## 2024-01-15 DIAGNOSIS — Z5189 Encounter for other specified aftercare: Secondary | ICD-10-CM

## 2024-01-15 DIAGNOSIS — M47817 Spondylosis without myelopathy or radiculopathy, lumbosacral region: Secondary | ICD-10-CM

## 2024-01-15 DIAGNOSIS — M545 Low back pain, unspecified: Secondary | ICD-10-CM

## 2024-01-15 DIAGNOSIS — G8929 Other chronic pain: Secondary | ICD-10-CM

## 2024-01-15 DIAGNOSIS — R937 Abnormal findings on diagnostic imaging of other parts of musculoskeletal system: Secondary | ICD-10-CM

## 2024-01-15 DIAGNOSIS — M47816 Spondylosis without myelopathy or radiculopathy, lumbar region: Secondary | ICD-10-CM | POA: Diagnosis not present

## 2024-01-15 DIAGNOSIS — M5459 Other low back pain: Secondary | ICD-10-CM

## 2024-01-15 MED ORDER — FENTANYL CITRATE (PF) 100 MCG/2ML IJ SOLN
25.0000 ug | INTRAMUSCULAR | Status: DC | PRN
  Administered 2024-01-15: 25 ug via INTRAVENOUS

## 2024-01-15 MED ORDER — MIDAZOLAM HCL 5 MG/5ML IJ SOLN
0.5000 mg | Freq: Once | INTRAMUSCULAR | Status: AC
  Administered 2024-01-15: 2 mg via INTRAVENOUS

## 2024-01-15 MED ORDER — LIDOCAINE HCL 2 % IJ SOLN
INTRAMUSCULAR | Status: AC
  Filled 2024-01-15: qty 20

## 2024-01-15 MED ORDER — TRIAMCINOLONE ACETONIDE 40 MG/ML IJ SUSP
80.0000 mg | Freq: Once | INTRAMUSCULAR | Status: AC
Start: 2024-01-15 — End: 2024-01-15
  Administered 2024-01-15: 80 mg

## 2024-01-15 MED ORDER — LIDOCAINE HCL 2 % IJ SOLN
20.0000 mL | Freq: Once | INTRAMUSCULAR | Status: AC
  Administered 2024-01-15: 400 mg

## 2024-01-15 MED ORDER — TRIAMCINOLONE ACETONIDE 40 MG/ML IJ SUSP
INTRAMUSCULAR | Status: AC
  Filled 2024-01-15: qty 2

## 2024-01-15 MED ORDER — PENTAFLUOROPROP-TETRAFLUOROETH EX AERO
INHALATION_SPRAY | Freq: Once | CUTANEOUS | Status: AC
  Administered 2024-01-15: 30 via TOPICAL

## 2024-01-15 MED ORDER — ROPIVACAINE HCL 2 MG/ML IJ SOLN
18.0000 mL | Freq: Once | INTRAMUSCULAR | Status: AC
  Administered 2024-01-15: 18 mL via PERINEURAL

## 2024-01-15 MED ORDER — MIDAZOLAM HCL 5 MG/5ML IJ SOLN
INTRAMUSCULAR | Status: AC
  Filled 2024-01-15: qty 5

## 2024-01-15 MED ORDER — ROPIVACAINE HCL 2 MG/ML IJ SOLN
INTRAMUSCULAR | Status: AC
  Filled 2024-01-15: qty 20

## 2024-01-15 MED ORDER — FENTANYL CITRATE (PF) 100 MCG/2ML IJ SOLN
INTRAMUSCULAR | Status: AC
  Filled 2024-01-15: qty 2

## 2024-01-15 NOTE — Patient Instructions (Signed)

## 2024-01-15 NOTE — Progress Notes (Signed)
PROVIDER NOTE: Interpretation of information contained herein should be left to medically-trained personnel. Specific patient instructions are provided elsewhere under "Patient Instructions" section of medical record. This document was created in part using STT-dictation technology, any transcriptional errors that may result from this process are unintentional.  Patient: Wanda Martinez Type: Established DOB: Aug 18, 1935 MRN: [id number] PCP: Dale [place], MD  Service: Procedure DOS: 01/15/2024 Setting: Ambulatory Location: Ambulatory outpatient facility Delivery: Face-to-face Provider: Oswaldo Done, MD Specialty: Interventional Pain Management Specialty designation: 09 Location: Outpatient facility Ref. Prov.: Dale Bromley, MD       Interventional Therapy   Type: Lumbar Facet, Medial Branch Block(s) (w/ fluoroscopic mapping)  #8   Laterality: Bilateral  Level: L2, L3, L4, L5, and S1 Medial Branch Level(s). Injecting these levels blocks the L3-4, L4-5, and L5-S1 lumbar facet joints. Imaging: Fluoroscopic guidance Spinal ([id number]) Anesthesia: Local anesthesia (1-2% Lidocaine) Anxiolysis: IV Versed 2.0 mg Sedation: Moderate Sedation Fentanyl 0.5 mL (25 mcg) DOS: 01/15/2024 Performed by: Oswaldo Done, MD  Primary Purpose: Diagnostic/Therapeutic Indications: Low back pain severe enough to impact quality of life or function. 1. Chronic low back pain (1ry area of Pain) (Bilateral) (R>L) w/o sciatica   2. Lumbar facet joint pain   3. Lumbar facet hypertrophy (Multilevel) (Bilateral)   4. Lumbar facet syndrome (Bilateral) (R>L)   5. Spondylosis without myelopathy or radiculopathy, lumbosacral region   6. Abnormal MRI, lumbar spine (08/23/2022)    NAS-11 Pain score:   Pre-procedure: 9 /10   Post-procedure: 0-No pain/10     Position / Prep / Materials:  Position: Prone  Prep solution: ChloraPrep (2% chlorhexidine gluconate and 70% isopropyl alcohol) Area  Prepped: Posterolateral Lumbosacral Spine (Wide prep: From the lower border of the scapula down to the end of the tailbone and from flank to flank.)  Materials:  Tray: Block Needle(s):  Type: Spinal  Gauge (G): 22  Length: 3.5-in Qty: 4      H&P (Pre-op Assessment):  [name] is a 89 y.o. (year old), female patient, seen today for interventional treatment. She  has a past surgical history that includes Tonsillectomy and adenoidectomy (1979); Vesicovaginal fistula closure w/ TAH (1983); Breast surgery (1986); Breast enhancement surgery (1987); Breast implant removal; Breast implant removal (Right, 08/29/2012); Mastectomy (08/2012); Abdominal hysterectomy; Colonoscopy with propofol (N/A, 09/13/2016); Breast biopsy (2013); and Blepharoplasty. [name] has a current medication list which includes the following prescription(s): amlodipine, aspirin ec, calcium carbonate, ciprofloxacin, citalopram, citalopram, cyclobenzaprine, donepezil, gabapentin, glucosamine-chondroitin, hydrochlorothiazide, letrozole, losartan, lubiprostone, magnesium oxide, melatonin, metoprolol succinate, mirabegron er, multivitamin, naloxone, nitrofurantoin (macrocrystal-monohydrate), omega-3, oxycodone, pantoprazole, and rosuvastatin, and the following Facility-Administered Medications: fentanyl. Her primarily concern today is the Back Pain  Initial Vital Signs:  Pulse/HCG Rate: (!) 54ECG Heart Rate: (!) 51 (sb) Temp: 97.7 F (36.5 C) Resp: 18 BP: (!) 146/69 SpO2: 100 %  BMI: Estimated body mass index is 28.7 kg/m as calculated from the following:   Height as of this encounter: 5\' 3"  (1.6 m).   Weight as of this encounter: 162 lb (73.5 kg).  Risk Assessment: Allergies: Reviewed. She is allergic to sulfa antibiotics and vesicare [solifenacin].  Allergy Precautions: None required Coagulopathies: Reviewed. None identified.  Blood-thinner therapy: None at this time Active Infection(s): Reviewed. None identified. Ms.  Greenwood is afebrile  Site Confirmation: Ms. Kleinman was asked to confirm the procedure and laterality before marking the site Procedure checklist: Completed Consent: Before the procedure and under the influence of no sedative(s), amnesic(s), or anxiolytics, the patient was informed of  the treatment options, risks and possible complications. To fulfill our ethical and legal obligations, as recommended by the American Medical Association's Code of Ethics, I have informed the patient of my clinical impression; the nature and purpose of the treatment or procedure; the risks, benefits, and possible complications of the intervention; the alternatives, including doing nothing; the risk(s) and benefit(s) of the alternative treatment(s) or procedure(s); and the risk(s) and benefit(s) of doing nothing. The patient was provided information about the general risks and possible complications associated with the procedure. These may include, but are not limited to: failure to achieve desired goals, infection, bleeding, organ or nerve damage, allergic reactions, paralysis, and death. In addition, the patient was informed of those risks and complications associated to Spine-related procedures, such as failure to decrease pain; infection (i.e.: Meningitis, epidural or intraspinal abscess); bleeding (i.e.: epidural hematoma, subarachnoid hemorrhage, or any other type of intraspinal or peri-dural bleeding); organ or nerve damage (i.e.: Any type of peripheral nerve, nerve root, or spinal cord injury) with subsequent damage to sensory, motor, and/or autonomic systems, resulting in permanent pain, numbness, and/or weakness of one or several areas of the body; allergic reactions; (i.e.: anaphylactic reaction); and/or death. Furthermore, the patient was informed of those risks and complications associated with the medications. These include, but are not limited to: allergic reactions (i.e.: anaphylactic or anaphylactoid reaction(s));  adrenal axis suppression; blood sugar elevation that in diabetics may result in ketoacidosis or comma; water retention that in patients with history of congestive heart failure may result in shortness of breath, pulmonary edema, and decompensation with resultant heart failure; weight gain; swelling or edema; medication-induced neural toxicity; particulate matter embolism and blood vessel occlusion with resultant organ, and/or nervous system infarction; and/or aseptic necrosis of one or more joints. Finally, the patient was informed that Medicine is not an exact science; therefore, there is also the possibility of unforeseen or unpredictable risks and/or possible complications that may result in a catastrophic outcome. The patient indicated having understood very clearly. We have given the patient no guarantees and we have made no promises. Enough time was given to the patient to ask questions, all of which were answered to the patient's satisfaction. Ms. Manner has indicated that she wanted to continue with the procedure. Attestation: I, the ordering provider, attest that I have discussed with the patient the benefits, risks, side-effects, alternatives, likelihood of achieving goals, and potential problems during recovery for the procedure that I have provided informed consent. Date  Time: 01/15/2024  8:58 AM   Pre-Procedure Preparation:  Monitoring: As per clinic protocol. Respiration, ETCO2, SpO2, BP, heart rate and rhythm monitor placed and checked for adequate function Safety Precautions: Patient was assessed for positional comfort and pressure points before starting the procedure. Time-out: I initiated and conducted the "Time-out" before starting the procedure, as per protocol. The patient was asked to participate by confirming the accuracy of the "Time Out" information. Verification of the correct person, site, and procedure were performed and confirmed by me, the nursing staff, and the patient.  "Time-out" conducted as per Joint Commission's Universal Protocol (UP.01.01.01). Time: 0957 Start Time: 0957 hrs.  Description of Procedure:          Laterality: (see above) Targeted Levels: (see above)  Safety Precautions: Aspiration looking for blood return was conducted prior to all injections. At no point did we inject any substances, as a needle was being advanced. Before injecting, the patient was told to immediately notify me if she was experiencing any new onset  of "ringing in the ears, or metallic taste in the mouth". No attempts were made at seeking any paresthesias. Safe injection practices and needle disposal techniques used. Medications properly checked for expiration dates. SDV (single dose vial) medications used. After the completion of the procedure, all disposable equipment used was discarded in the proper designated medical waste containers. Local Anesthesia: Protocol guidelines were followed. The patient was positioned over the fluoroscopy table. The area was prepped in the usual manner. The time-out was completed. The target area was identified using fluoroscopy. A 12-in long, straight, sterile hemostat was used with fluoroscopic guidance to locate the targets for each level blocked. Once located, the skin was marked with an approved surgical skin marker. Once all sites were marked, the skin (epidermis, dermis, and hypodermis), as well as deeper tissues (fat, connective tissue and muscle) were infiltrated with a small amount of a short-acting local anesthetic, loaded on a 10cc syringe with a 25G, 1.5-in  Needle. An appropriate amount of time was allowed for local anesthetics to take effect before proceeding to the next step. Local Anesthetic: Lidocaine 2.0% The unused portion of the local anesthetic was discarded in the proper designated containers. Technical description of process:  L2 Medial Branch Nerve Block (MBB): The target area for the L2 medial branch is at the junction of the  postero-lateral aspect of the superior articular process and the superior, posterior, and medial edge of the transverse process of L3. Under fluoroscopic guidance, a Quincke needle was inserted until contact was made with os over the superior postero-lateral aspect of the pedicular shadow (target area). After negative aspiration for blood, 0.5 mL of the nerve block solution was injected without difficulty or complication. The needle was removed intact. L3 Medial Branch Nerve Block (MBB): The target area for the L3 medial branch is at the junction of the postero-lateral aspect of the superior articular process and the superior, posterior, and medial edge of the transverse process of L4. Under fluoroscopic guidance, a Quincke needle was inserted until contact was made with os over the superior postero-lateral aspect of the pedicular shadow (target area). After negative aspiration for blood, 0.5 mL of the nerve block solution was injected without difficulty or complication. The needle was removed intact. L4 Medial Branch Nerve Block (MBB): The target area for the L4 medial branch is at the junction of the postero-lateral aspect of the superior articular process and the superior, posterior, and medial edge of the transverse process of L5. Under fluoroscopic guidance, a Quincke needle was inserted until contact was made with os over the superior postero-lateral aspect of the pedicular shadow (target area). After negative aspiration for blood, 0.5 mL of the nerve block solution was injected without difficulty or complication. The needle was removed intact. L5 Medial Branch Nerve Block (MBB): The target area for the L5 medial branch is at the junction of the postero-lateral aspect of the superior articular process and the superior, posterior, and medial edge of the sacral ala. Under fluoroscopic guidance, a Quincke needle was inserted until contact was made with os over the superior postero-lateral aspect of the  pedicular shadow (target area). After negative aspiration for blood, 0.5 mL of the nerve block solution was injected without difficulty or complication. The needle was removed intact. S1 Medial Branch Nerve Block (MBB): The target area for the S1 medial branch is at the posterior and inferior 6 o'clock position of the L5-S1 facet joint. Under fluoroscopic guidance, the Quincke needle inserted for the L5 MBB was redirected  until contact was made with os over the inferior and postero aspect of the sacrum, at the 6 o' clock position under the L5-S1 facet joint (Target area). After negative aspiration for blood, 0.5 mL of the nerve block solution was injected without difficulty or complication. The needle was removed intact.  Once the entire procedure was completed, the treated area was cleaned, making sure to leave some of the prepping solution back to take advantage of its long term bactericidal properties.         Illustration of the posterior view of the lumbar spine and the posterior neural structures. Laminae of L2 through S1 are labeled. DPRL5, dorsal primary ramus of L5; DPRS1, dorsal primary ramus of S1; DPR3, dorsal primary ramus of L3; FJ, facet (zygapophyseal) joint L3-L4; I, inferior articular process of L4; LB1, lateral branch of dorsal primary ramus of L1; IAB, inferior articular branches from L3 medial branch (supplies L4-L5 facet joint); IBP, intermediate branch plexus; MB3, medial branch of dorsal primary ramus of L3; NR3, third lumbar nerve root; S, superior articular process of L5; SAB, superior articular branches from L4 (supplies L4-5 facet joint also); TP3, transverse process of L3.   Facet Joint Innervation (* possible contribution)  L1-2 T12, L1 (L2*)  Medial Branch  L2-3 L1, L2 (L3*)         "          "  L3-4 L2, L3 (L4*)         "          "  L4-5 L3, L4 (L5*)         "          "  L5-S1 L4, L5, S1          "          "    Vitals:   01/15/24 1007 01/15/24 1008 01/15/24  1015 01/15/24 1025  BP: 122/76 123/80 (!) 163/71 (!) 165/76  Pulse:      Resp: 17 18 16 16   Temp:      TempSrc:      SpO2: 100% 100% 98% 98%  Weight:      Height:         End Time: 1008 hrs.  Imaging Guidance (Spinal):          Type of Imaging Technique: Fluoroscopy Guidance (Spinal) Indication(s): Fluoroscopy guidance for needle placement to enhance accuracy in procedures requiring precise needle localization for targeted delivery of medication in or near specific anatomical locations not easily accessible without such real-time imaging assistance. Exposure Time: Please see nurses notes. Contrast: None used. Fluoroscopic Guidance: I was personally present during the use of fluoroscopy. "Tunnel Vision Technique" used to obtain the best possible view of the target area. Parallax error corrected before commencing the procedure. "Direction-depth-direction" technique used to introduce the needle under continuous pulsed fluoroscopy. Once target was reached, antero-posterior, oblique, and lateral fluoroscopic projection used confirm needle placement in all planes. Images permanently stored in EMR. Interpretation: No contrast injected. I personally interpreted the imaging intraoperatively. Adequate needle placement confirmed in multiple planes. Permanent images saved into the patient's record.  Post-operative Assessment:  Post-procedure Vital Signs:  Pulse/HCG Rate: (!) 5461 Temp: 97.7 F (36.5 C) Resp: 16 BP: (!) 165/76 SpO2: 98 %  EBL: None  Complications: No immediate post-treatment complications observed by team, or reported by patient.  Note: The patient tolerated the entire procedure well. A repeat set of vitals were taken after the procedure and  the patient was kept under observation following institutional policy, for this type of procedure. Post-procedural neurological assessment was performed, showing return to baseline, prior to discharge. The patient was provided with  post-procedure discharge instructions, including a section on how to identify potential problems. Should any problems arise concerning this procedure, the patient was given instructions to immediately contact us, at any time, without hesitation. In any case, we plan to contact the patient by telephone for a follow-up status report regarding this interventional procedure.  Comments:  No additional relevant information.  Plan of Care (POC)  Orders:  Orders Placed This Encounter  Procedures   LUMBAR FACET(MEDIAL BRANCH NERVE BLOCK) MBNB    Scheduling Instructions:     Procedure: Lumbar facet block (AKA.: Lumbosacral medial branch nerve block)     Side: Bilateral     Level: L3-4, L4-5, L5-S1, and TBD Facets (L2, L3, L4, L5, S1, and TBD Medial Branch Nerves)     Sedation: Patient's choice.     Timeframe: Today    Where will this procedure be performed?:   [facility]   DG PA[facility] C-ARM 1-60 MIN NO REPORT    Intraoperative interpretation by procedural physician at [facility].    Standing Status:   Standing    Number of Occurrences:   1    Reason for exam::   Assistance in needle guidance and placement for procedures requiring needle placement in or near specific anatomical locations not easily accessible without such assistance.   Informed Consent Details: Physician/Practitioner Attestation; Transcribe to consent form and obtain patient signature    Nursing Order: Transcribe to consent form and obtain patient signature. Note: Always confirm laterality of pain with [name], before procedure.    Physician/Practitioner attestation of informed consent for procedure/surgical case:   I, the physician/practitioner, attest that I have discussed with the patient the benefits, risks, side effects, alternatives, likelihood of achieving goals and potential problems during recovery for the procedure that I have provided informed consent.    Procedure:   Lumbar Facet Block  under  fluoroscopic guidance    Physician/Practitioner performing the procedure:   Elaijah Munoz A. Laban Emperor MD    Indication/Reason:   Low Back Pain, with our without leg pain, due to Facet Joint Arthralgia (Joint Pain) Spondylosis (Arthritis of the Spine), without myelopathy or radiculopathy (Nerve Damage).   Provide equipment / supplies at bedside    Procedure tray: "Block Tray" (Disposable  single use) Skin infiltration needle: Regular 1.5-in, 25-G, (x1) Block Needle type: Spinal Amount/quantity: 4 Size: Regular (3.5-inch) Gauge: 22G    Standing Status:   Standing    Number of Occurrences:   1    Specify:   Block Tray   Saline lock IV    Have LR [phone] mL available and administer at 125 mL/hr if patient becomes hypotensive.    Standing Status:   Standing    Number of Occurrences:   1   Chronic Opioid Analgesic:  Oxycodone IR 5 mg, 1 tab PO q 8 hrs (15 mg/day of oxycodone) MME/day: 22.5 mg/day.   Medications ordered for procedure: Meds ordered this encounter  Medications   lidocaine (XYLOCAINE) 2 % (with pres) injection 400 mg   pentafluoroprop-tetrafluoroeth (GEBAUERS) aerosol   midazolam (VERSED) 5 MG/5ML injection 0.5-2 mg    Make sure Flumazenil is available in the pyxis when using this medication. If oversedation occurs, administer 0.2 mg IV over 15 sec. If after 45 sec no response, administer 0.2 mg again over  1 min; may repeat at 1 min intervals; not to exceed 4 doses (1 mg)   fentaNYL (SUBLIMAZE) injection 25-50 mcg    Make sure Narcan is available in the pyxis when using this medication. In the event of respiratory depression (RR< 8/min): Titrate NARCAN (naloxone) in increments of 0.1 to 0.2 mg IV at 2-3 minute intervals, until desired degree of reversal.   ropivacaine (PF) 2 mg/mL (0.2%) (NAROPIN) injection 18 mL   triamcinolone acetonide (KENALOG-40) injection 80 mg   Medications administered: We administered lidocaine, pentafluoroprop-tetrafluoroeth, midazolam, fentaNYL,  ropivacaine (PF) 2 mg/mL (0.2%), and triamcinolone acetonide.  See the medical record for exact dosing, route, and time of administration.  Follow-up plan:   Return in about 2 weeks (around 01/29/2024) for (Face2F), (PPE).       Interventional Therapies  Risk Factors  Considerations  Medical Comorbidities:  Parkinson's disease  Hx right breast cancer  OIC  SOB  CKD Stage 3b  GERD  depression  osteopenia/osteoporosis  PSVT  Memory impairment     Planned  Pending:   Palliative bilateral lumbar facet MBB #8  Therapeutic right IA Zilretta knee injection #3 + Pes anserine bursa + Hoffa's pad inj. #1 (11/13/2023)    Under consideration:  Therapeutic right sided suprascapular nerve RFA #1    Completed:   Diagnostic/therapeutic right suprascapular NB x2 (10/16/2023) (100/100/75 x 5 days/0) Therapeutic right trapezius TPI x4 (07/17/2023) (100/100/0/0)  Therapeutic right thoracic TPI x2 (10/17/2018) (DKFUA)  Therapeutic right QL & ESM TPI x1 (02/12/2018)   Diagnostic right SI joint Blk x3 (12/21/2020)  (DKFUA)  Therapeutic right SI joint RFA x4 (03/01/2023) (100/100/70/70)   Therapeutic right lumbar facet MBB (FCT: L3-4,L4-5,L5-S1) x5 (08/09/2021) (100/100/50)  Therapeutic right lumbar facet MBB (FCT: L4-5, L5-S1) x7 (11/01/2023) (100/100/70/R:70)  Therapeutic right lumbar facet MBB (FCT: L5-S1) x1 (08/29/2022) (100/100/100/0)  Therapeutic right lumbar facet RFA x4 (03/01/2023) (100/100/70/70)   Therapeutic bilateral L5 and L2 TFESI x1 (07/03/2023) (100/90/50/L:100R:20))  Therapeutic right L3 and L4 TFESI x1 (07/27/2022) (100/100/75/50)  Therapeutic right L5 TFESI x4 (02/14/2022) (100/100/70/75)  Therapeutic right S1 TFESI x1 (11/04/2020) (100/100/20/<50)   Therapeutic right L3-4 LESI x2 (11/04/2020) (100/100/20/<50)  Therapeutic right L4-5 LESI x2 (03/30/2022) (100/100/75/75)  Therapeutic right L5-S1 LESI x2 (12/04/2019) (100/100/50/75)  Therapeutic midline L2-3 LESI x1  (10/04/2017) (100/100/85/>50)   Diagnostic bilateral superficial trochanteric bursa injec. x1 (04/30/2018) (DKFUA)  Diagnostic right IA hip injection x1 (02/13/2019) (DKFUA)   Diagnostic/therapeutic right IA steroid knee inj. x5 (07/31/2023) (100/100/100/80)  Therapeutic right (Zilretta) knee injection x3 (11/13/2023)  Therapeutic right (Monovisc) knee injection x1 (06/07/2021) (100/100/100/75)   Therapeutic left thoracic TPI x1 (10/17/2018) (DKFUA)  Diagnostic left SI joint Blk x2 (04/12/2023) (100/90/20/<25)   Therapeutic left lumbar facet MBB (L2-S1) (FCT: L3-4,L4-5,L5-S1) x5 (08/09/2021) (100/100/50)  Therapeutic left lumbar facet MBB (FCT: L4-5, L5-S1) x7 (11/01/2023) (100/100/70/L: 85)  Therapeutic left lumbar facet MBB (L4-S1) (FCT: L5-S1) x1 (08/29/2022) (100/100/100/100)  Therapeutic left lumbar facet RFA x2 (11/30/2022) (100/60/90/>75)    Therapeutic  Palliative (PRN) options:   Do not schedule any procedures without a proper preprocedure evaluation.   Completed by other providers:   Therapeutic right knee (ORTHOVISC) inj. x3 (05/18/2023, 05/23/2023, 05/30/2023) by Aram Candela, MD [facility]) Therapeutic left knee (Kenalog) inj. x1 (08/25/2020) by Novella Olive, MD [facility] PMR)   Pharmacotherapy  Nonopioids transfer 12/21/2020: Gabapentin       Recent Visits Date Type Provider Dept  12/25/23 Office Visit [name], MD [facility]  12/06/23 Procedure visit  [name], MD [facility]  11/28/23 Office Visit [name], MD [facility]  11/13/23 Procedure visit [name], MD [facility]  11/12/23 Office Visit [name], MD [facility]  11/01/23 Procedure visit [name], MD [facility]  Showing recent visits within past 90 days and meeting all other requirements Today's Visits Date Type Provider Dept  01/15/24 Procedure visit [name], MD [facility]  Showing today's visits and meeting all other requirements Future Appointments Date Type Provider Dept  01/29/24 Appointment [name], MD [facility]  02/11/24 Appointment [name], MD [facility]  Showing future appointments within next 90 days and meeting all other requirements  Disposition: Discharge home  Discharge (Date  Time): 01/15/2024; 1033 hrs.   Primary Care Physician: Dale Fisher, MD Location: [facility] Note by: Oswaldo Done, MD (TTS technology used. I apologize for any typographical errors that were not detected and corrected.) Date: 01/15/2024; Time: 10:57 AM  Disclaimer:  Medicine is not an Visual merchandiser. The only guarantee in medicine is that nothing is guaranteed. It is important to note that the decision to proceed with this intervention was based on the information collected from the patient. The Data and conclusions were drawn from the patient's questionnaire, the interview, and the physical examination. Because the information was provided in large part by the patient, it cannot be guaranteed that it has not been purposely or unconsciously manipulated. Every effort has been made to obtain as much relevant data as possible for this evaluation. It is important to note that the conclusions that lead to this procedure are derived in large part from the available data. Always take into account that the treatment will also be dependent on availability of resources and existing treatment guidelines, considered by other Pain Management Practitioners as being common knowledge and practice, at the time of the intervention. For Medico-Legal purposes, it is also important to point out that variation in procedural techniques and pharmacological choices are the acceptable norm. The indications, contraindications, technique, and results of the above procedure should only be interpreted and judged by a  Board-Certified Interventional Pain Specialist with extensive familiarity and expertise in the same exact procedure and technique.

## 2024-01-16 ENCOUNTER — Telehealth: Payer: Self-pay

## 2024-01-16 ENCOUNTER — Ambulatory Visit: Payer: Medicare Other | Admitting: *Deleted

## 2024-01-16 VITALS — BP 138/80 | HR 62 | Temp 96.9°F | Ht 63.0 in | Wt 163.0 lb

## 2024-01-16 DIAGNOSIS — Z0000 Encounter for general adult medical examination without abnormal findings: Secondary | ICD-10-CM

## 2024-01-16 NOTE — Patient Instructions (Signed)
Wanda Martinez , Thank you for taking time to come for your Medicare Wellness Visit. I appreciate your ongoing commitment to your health goals. Please review the following plan we discussed and let me know if I can assist you in the future.   Referrals/Orders/Follow-Ups/Clinician Recommendations: Will contact your pharmacy to get the dates of your vaccines and will document.  This is a list of the screening recommended for you and due dates:  Health Maintenance  Topic Date Due   Zoster (Shingles) Vaccine (1 of 2) Never done   COVID-19 Vaccine (4 - 2024-25 season) 08/19/2023   Pneumonia Vaccine (2 of 2 - PCV) 02/01/2024*   Flu Shot  05/16/2024*   Mammogram  09/03/2035*   Medicare Annual Wellness Visit  01/15/2025   DEXA scan (bone density measurement)  Completed   HPV Vaccine  Aged Out   DTaP/Tdap/Td vaccine  Discontinued  *Topic was postponed. The date shown is not the original due date.    Advanced directives: (Provided) Advance directive discussed with you today. I have provided a copy for you to complete at home and have notarized. Once this is complete, please bring a copy in to our office so we can scan it into your chart.   Next Medicare Annual Wellness Visit scheduled for next year: Yes 01/26/25 @ 1:00   Managing Pain Without Opioids Opioids are strong medicines used to treat moderate to severe pain. For some people, especially those who have long-term (chronic) pain, opioids may not be the best choice for pain management due to: Side effects like nausea, constipation, and sleepiness. The risk of addiction (opioid use disorder). The longer you take opioids, the greater your risk of addiction. Pain that lasts for more than 3 months is called chronic pain. Managing chronic pain usually requires more than one approach and is often provided by a team of health care providers working together (multidisciplinary approach). Pain management may be done at a pain management center or pain  clinic. How to manage pain without the use of opioids Use non-opioid medicines Non-opioid medicines for pain may include: Over-the-counter or prescription non-steroidal anti-inflammatory drugs (NSAIDs). These may be the first medicines used for pain. They work well for muscle and bone pain, and they reduce swelling. Acetaminophen. This over-the-counter medicine may work well for milder pain but not swelling. Antidepressants. These may be used to treat chronic pain. A certain type of antidepressant (tricyclics) is often used. These medicines are given in lower doses for pain than when used for depression. Anticonvulsants. These are usually used to treat seizures but may also reduce nerve (neuropathic) pain. Muscle relaxants. These relieve pain caused by sudden muscle tightening (spasms). You may also use a pain medicine that is applied to the skin as a patch, cream, or gel (topical analgesic), such as a numbing medicine. These may cause fewer side effects than medicines taken by mouth. Do certain therapies as directed Some therapies can help with pain management. They include: Physical therapy. You will do exercises to gain strength and flexibility. A physical therapist may teach you exercises to move and stretch parts of your body that are weak, stiff, or painful. You can learn these exercises at physical therapy visits and practice them at home. Physical therapy may also involve: Massage. Heat wraps or applying heat or cold to affected areas. Electrical signals that interrupt pain signals (transcutaneous electrical nerve stimulation, TENS). Weak lasers that reduce pain and swelling (low-level laser therapy). Signals from your body that help you learn to  regulate pain (biofeedback). Occupational therapy. This helps you to learn ways to function at home and work with less pain. Recreational therapy. This involves trying new activities or hobbies, such as a physical activity or drawing. Mental  health therapy, including: Cognitive behavioral therapy (CBT). This helps you learn coping skills for dealing with pain. Acceptance and commitment therapy (ACT) to change the way you think and react to pain. Relaxation therapies, including muscle relaxation exercises and mindfulness-based stress reduction. Pain management counseling. This may be individual, family, or group counseling.  Receive medical treatments Medical treatments for pain management include: Nerve block injections. These may include a pain blocker and anti-inflammatory medicines. You may have injections: Near the spine to relieve chronic back or neck pain. Into joints to relieve back or joint pain. Into nerve areas that supply a painful area to relieve body pain. Into muscles (trigger point injections) to relieve some painful muscle conditions. A medical device placed near your spine to help block pain signals and relieve nerve pain or chronic back pain (spinal cord stimulation device). Acupuncture. Follow these instructions at home Medicines Take over-the-counter and prescription medicines only as told by your health care provider. If you are taking pain medicine, ask your health care providers about possible side effects to watch out for. Do not drive or use heavy machinery while taking prescription opioid pain medicine. Lifestyle  Do not use drugs or alcohol to reduce pain. If you drink alcohol, limit how much you have to: 0-1 drink a day for women who are not pregnant. 0-2 drinks a day for men. Know how much alcohol is in a drink. In the [place] one drink equals one 12 oz bottle of beer (355 mL), one 5 oz glass of wine (148 mL), or one 1 oz glass of hard liquor (44 mL). Do not use any products that contain nicotine or tobacco. These products include cigarettes, chewing tobacco, and vaping devices, such as e-cigarettes. If you need help quitting, ask your health care provider. Eat a healthy diet and maintain a healthy  weight. Poor diet and excess weight may make pain worse. Eat foods that are high in fiber. These include fresh fruits and vegetables, whole grains, and beans. Limit foods that are high in fat and processed sugars, such as fried and sweet foods. Exercise regularly. Exercise lowers stress and may help relieve pain. Ask your health care provider what activities and exercises are safe for you. If your health care provider approves, join an exercise class that combines movement and stress reduction. Examples include yoga and tai chi. Get enough sleep. Lack of sleep may make pain worse. Lower stress as much as possible. Practice stress reduction techniques as told by your therapist. General instructions Work with all your pain management providers to find the treatments that work best for you. You are an important member of your pain management team. There are many things you can do to reduce pain on your own. Consider joining an online or in-person support group for people who have chronic pain. Keep all follow-up visits. This is important. Where to find more information You can find more information about managing pain without opioids from: American Academy of Pain Medicine: painmed.org Institute for Chronic Pain: instituteforchronicpain.org American Chronic Pain Association: theacpa.org Contact a health care provider if: You have side effects from pain medicine. Your pain gets worse or does not get better with treatments or home therapy. You are struggling with anxiety or depression. Summary Many types of pain can be  managed without opioids. Chronic pain may respond better to pain management without opioids. Pain is best managed when you and a team of health care providers work together. Pain management without opioids may include non-opioid medicines, medical treatments, physical therapy, mental health therapy, and lifestyle changes. Tell your health care providers if your pain gets worse or  is not being managed well enough. This information is not intended to replace advice given to you by your health care provider. Make sure you discuss any questions you have with your health care provider. Document Revised: 03/16/2021 Document Reviewed: 03/16/2021 Elsevier Patient Education  2024 [employer].

## 2024-01-16 NOTE — Progress Notes (Signed)
Subjective:   Wanda Martinez is a 89 y.o. female who presents for Medicare Annual (Subsequent) preventive examination.  Visit Complete: In person    Cardiac Risk Factors include: advanced age (>72men, >60 women);dyslipidemia;hypertension;sedentary lifestyle     Objective:    Today's Vitals   01/16/24 1510  BP: 138/80  Pulse: 62  Temp: (!) 96.9 F (36.1 C)  TempSrc: Skin  SpO2: 97%  Weight: 163 lb (73.9 kg)  Height: 5\' 3"  (1.6 m)   Body mass index is 28.87 kg/m.     01/16/2024    3:28 PM 01/15/2024    9:02 AM 12/25/2023   12:54 PM 12/06/2023   10:51 AM 11/28/2023    1:55 PM 11/13/2023   12:47 PM 11/12/2023    1:49 PM  Advanced Directives  Does Patient Have a Medical Advance Directive? No No No No No No No  Would patient like information on creating a medical advance directive? Yes (MAU/Ambulatory/Procedural Areas - Information given) No - Patient declined         Current Medications (verified) Outpatient Encounter Medications as of 01/16/2024  Medication Sig   amLODipine (NORVASC) 2.5 MG tablet Take 1 tablet (2.5 mg total) by mouth daily.   aspirin 81 MG EC tablet Take 81 mg by mouth daily as needed.   calcium carbonate (OSCAL) 1500 (600 Ca) MG TABS tablet Take 1 tablet by mouth daily.   citalopram (CELEXA) 10 MG tablet Take 10 mg by mouth daily.   citalopram (CELEXA) 20 MG tablet TAKE 1 TABLET BY MOUTH ONCE DAILY.   cyclobenzaprine (FLEXERIL) 10 MG tablet TAKE ONE TABLET BY MOUTH AT BEDTIME.   donepezil (ARICEPT) 10 MG tablet TAKE ONE TABLET BY MOUTH AT BEDTIME   gabapentin (NEURONTIN) 300 MG capsule Take 1 capsule (300 mg total) by mouth 2 (two) times daily.   Glucosamine-Chondroitin 500-400 MG CAPS Take 2 capsules by mouth daily.   hydrochlorothiazide (HYDRODIURIL) 12.5 MG tablet Take 12.5 mg by mouth every morning.   letrozole (FEMARA) 2.5 MG tablet Take 2.5 mg by mouth daily.   losartan (COZAAR) 50 MG tablet TAKE (2) TABLETS BY MOUTH ONCE DAILY.    lubiprostone (AMITIZA) 24 MCG capsule TAKE 1 CAPSULE BY MOUTH ONCE DAILY WITH BREAKFAST.   magnesium oxide (MAG-OX) 400 (240 Mg) MG tablet TAKE ONE TABLET BY MOUTH EVERY DAY   melatonin 1 MG TABS tablet Take 3 mg by mouth at bedtime.   metoprolol succinate (TOPROL-XL) 25 MG 24 hr tablet Take 1 tablet (25 mg total) by mouth 2 (two) times daily.   mirabegron ER (MYRBETRIQ) 50 MG TB24 tablet Take 1 tablet (50 mg total) by mouth daily.   Multiple Vitamin (MULTIVITAMIN) tablet Take 1 tablet by mouth daily.   naloxone (NARCAN) nasal spray 4 mg/0.1 mL Place 1 spray into the nose as needed for up to 365 doses (for opioid-induced respiratory depresssion). In case of emergency (overdose), spray once into each nostril. If no response within 3 minutes, repeat application and call 911.   nitrofurantoin, macrocrystal-monohydrate, (MACROBID) 100 MG capsule Take 100 mg by mouth 2 (two) times daily.   Omega-3 1000 MG CAPS Take by mouth.   oxyCODONE (OXY IR/ROXICODONE) 5 MG immediate release tablet Take 1 tablet (5 mg total) by mouth every 6 (six) hours as needed for severe pain (pain score 7-10) or breakthrough pain (PRN for post-radiofrequency pain). Must last 30 days.   pantoprazole (PROTONIX) 40 MG tablet TAKE ONE TABLET BY MOUTH EVERY DAY   rosuvastatin (CRESTOR)  20 MG tablet TAKE ONE TABLET BY MOUTH AT BEDTIME   [DISCONTINUED] ciprofloxacin (CIPRO) 500 MG tablet Take 500 mg by mouth 2 (two) times daily. (Patient not taking: Reported on 01/16/2024)   No facility-administered encounter medications on file as of 01/16/2024.    Allergies (verified) Sulfa antibiotics and Vesicare [solifenacin]   History: Past Medical History:  Diagnosis Date   Acute postoperative pain 10/25/2017   Anemia    Arm pain 07/26/2015   Arthritis    Arthritis, degenerative 03/26/2014   Back pain 11/01/2013   Bladder infection 06/2018   Breast cancer (HCC)    Masectomy - left - 1986    Breast cancer [facility])    Mastectomy-right -2014    CHEST PAIN 04/29/2010   Qualifier: Diagnosis of  By: Laural Benes RN, Erika     Chronic cystitis    Cystocele 02/22/2013   Cystocele, midline 08/19/2013   Degeneration of intervertebral disc of lumbosacral region 03/26/2014   DYSPNEA 04/29/2010   Qualifier: Diagnosis of  By: Laural Benes RN, Erika     Enthesopathy of hip 03/26/2014   GERD (gastroesophageal reflux disease)    Hiatal hernia    HTN (hypertension)    Hypokalemia 06/2018   Hyponatremia 06/2018   LBP (low back pain) 03/26/2014   Neck pain 11/01/2013   Parkinson disease (HCC)    Pneumonia 06/2018   Sinusitis 02/07/2015   Skin lesions 07/12/2014   Urinary incontinence    mixed    Past Surgical History:  Procedure Laterality Date   ABDOMINAL HYSTERECTOMY     BLEPHAROPLASTY     BREAST BIOPSY  2013   BREAST ENHANCEMENT SURGERY  1987   BREAST IMPLANT REMOVAL     BREAST IMPLANT REMOVAL Right 08/29/2012   BREAST SURGERY  1986   s/p left mastectomy   COLONOSCOPY WITH PROPOFOL N/A 09/13/2016   Procedure: COLONOSCOPY WITH PROPOFOL;  Surgeon: Scot Jun, MD;  Location: [facility] ENDOSCOPY;  Service: Endoscopy;  Laterality: N/A;   MASTECTOMY  08/2012   right   TONSILLECTOMY AND ADENOIDECTOMY  1979   VESICOVAGINAL FISTULA CLOSURE W/ TAH  1983   Family History  Problem Relation Age of Onset   Diabetes Father    Stroke Father    Colon polyps Father    Stroke Mother    Parkinson's disease Mother    Social History   Socioeconomic History   Marital status: Married    Spouse name: Not on file   Number of children: 3   Years of education: Not on file   Highest education level: Not on file  Occupational History   Not on file  Tobacco Use   Smoking status: Never    Passive exposure: Never   Smokeless tobacco: Never   Tobacco comments:    tobacco use - no  Vaping Use   Vaping status: Never Used  Substance and Sexual Activity   Alcohol use: No    Alcohol/week: 0.0 standard drinks of alcohol   Drug use: No   Sexual activity: Never   Other Topics Concern   Not on file  Social History Narrative   married   Social Drivers of Corporate investment banker Strain: Low Risk  (01/16/2024)   Overall Financial Resource Strain (CARDIA)    Difficulty of Paying Living Expenses: Not hard at all  Food Insecurity: No Food Insecurity (01/16/2024)   Hunger Vital Sign    Worried About Running Out of Food in the Last Year: Never true    Ran Out  of Food in the Last Year: Never true  Transportation Needs: No Transportation Needs (01/16/2024)   PRAPARE - Administrator, Civil Service (Medical): No    Lack of Transportation (Non-Medical): No  Physical Activity: Inactive (01/16/2024)   Exercise Vital Sign    Days of Exercise per Week: 0 days    Minutes of Exercise per Session: 0 min  Stress: No Stress Concern Present (01/16/2024)   [employer] of Occupational Health - Occupational Stress Questionnaire    Feeling of Stress : Only a little  Social Connections: Moderately Integrated (01/16/2024)   Social Connection and Isolation Panel [NHANES]    Frequency of Communication with Friends and Family: More than three times a week    Frequency of Social Gatherings with Friends and Family: More than three times a week    Attends Religious Services: More than 4 times per year    Active Member of [employer] or Organizations: No    Attends Engineer, structural: Never    Marital Status: Married    Tobacco Counseling Counseling given: Not Answered Tobacco comments: tobacco use - no   Clinical Intake:  Pre-visit preparation completed: Yes  Pain : No/denies pain     BMI - recorded: 28.87 Nutritional Status: BMI 25 -29 Overweight Nutritional Risks: None Diabetes: No  How often do you need to have someone help you when you read instructions, pamphlets, or other written materials from your doctor or pharmacy?: 1 - Never  Interpreter Needed?: No  Information entered by :: R. Caulin Begley LPN   Activities of Daily  Living    01/16/2024    3:17 PM  In your present state of health, do you have any difficulty performing the following activities:  Hearing? 1  Comment wears aids  Vision? 0  Difficulty concentrating or making decisions? 0  Walking or climbing stairs? 1  Dressing or bathing? 0  Doing errands, shopping? 1  Preparing Food and eating ? N  Using the Toilet? N  In the past six months, have you accidently leaked urine? Y  Do you have problems with loss of bowel control? N  Managing your Medications? N  Managing your Finances? Y  Comment husband  Housekeeping or managing your Housekeeping? Y  Comment husband helps    Patient Care Team: Dale [place], MD as PCP - General (Internal Medicine) Debbe Odea, MD as PCP - Cardiology (Cardiology) [name], MD as Consulting Physician (Pain Medicine)  Indicate any recent Medical Services you may have received from other than [facility] providers in the past year (date may be approximate).     Assessment:   This is a routine wellness examination for Wanda Martinez.  Hearing/Vision screen Hearing Screening - Comments:: Wears aids Vision Screening - Comments:: No glasses   Goals Addressed             This Visit's Progress    Patient Stated       Plans on physical therapy to help with mobility       Depression Screen    01/16/2024    3:24 PM 01/15/2024    9:01 AM 01/04/2024    3:16 PM 12/25/2023   12:54 PM 12/06/2023   10:51 AM 11/28/2023    1:55 PM 11/13/2023   12:47 PM  PHQ 2/9 Scores  PHQ - 2 Score 0 0 0 0 0 0 0  PHQ- 9 Score 1  0        Fall Risk    01/16/2024  3:20 PM 01/15/2024    9:01 AM 12/25/2023   12:54 PM 12/06/2023   10:51 AM 11/28/2023    1:55 PM  Fall Risk   Falls in the past year? 0 0 0 0 0  Number falls in past yr: 0 0   0  Injury with Fall? 0 0   0  Risk for fall due to : No Fall Risks      Follow up Falls prevention discussed;Falls evaluation completed        MEDICARE RISK AT HOME: Medicare Risk  at Home Any stairs in or around the home?: Yes If so, are there any without handrails?: No Home free of loose throw rugs in walkways, pet beds, electrical cords, etc?: Yes Adequate lighting in your home to reduce risk of falls?: Yes Life alert?: No Use of a cane, walker or w/c?: Yes Grab bars in the bathroom?: Yes Shower chair or bench in shower?: Yes Elevated toilet seat or a handicapped toilet?: Yes  TIMED UP AND GO:  Was the test performed?  Yes  Length of time to ambulate 10 feet: 10 sec Gait slow and steady with assistive device    Cognitive Function:    07/12/2020    1:26 PM 07/10/2019    2:44 PM 10/26/2016    1:50 PM  MMSE - Mini Mental State Exam  Not completed: Unable to complete Refused   Orientation to time   5  Orientation to Place   5  Registration   1  Attention/ Calculation   5  Recall   3  Language- name 2 objects   2  Language- repeat   1  Language- follow 3 step command   3  Language- read & follow direction   1  Write a sentence   1  Copy design   1  Total score   28        01/16/2024    3:29 PM 10/26/2016    1:53 PM  6CIT Screen  What Year? 0 points 0 points  What month? 0 points 0 points  What time? 0 points 0 points  Count back from 20 0 points 0 points  Months in reverse 0 points 0 points  Repeat phrase 2 points   Total Score 2 points     Immunizations Immunization History  Administered Date(s) Administered   Fluad Quad(high Dose 65+) 09/01/2019, 03/07/2022   Influenza Split 11/02/2014   Influenza, High Dose Seasonal PF 10/02/2016, 08/23/2018   Influenza,inj,Quad PF,6+ Mos 09/03/2015   Influenza-Unspecified 09/22/2013, 11/06/2014, 09/03/2015, 10/02/2016   Moderna Sars-Covid-2 Vaccination 12/31/2019, 01/28/2020, 11/29/2020   Pneumococcal Polysaccharide-23 09/02/2018    TDAP status: Up to date  Flu Vaccine status: Due, Education has been provided regarding the importance of this vaccine. Advised may receive this vaccine at local  pharmacy or Health Dept. Aware to provide a copy of the vaccination record if obtained from local pharmacy or Health Dept. Verbalized acceptance and understanding.  Pneumococcal vaccine status: Due, Education has been provided regarding the importance of this vaccine. Advised may receive this vaccine at local pharmacy or Health Dept. Aware to provide a copy of the vaccination record if obtained from local pharmacy or Health Dept. Verbalized acceptance and understanding.  Covid-19 vaccine status: Information provided on how to obtain vaccines.   Qualifies for Shingles Vaccine? Yes   Zostavax completed No   Shingrix Completed?: No.    Education has been provided regarding the importance of this vaccine. Patient has been advised to  call insurance company to determine out of pocket expense if they have not yet received this vaccine. Advised may also receive vaccine at local pharmacy or Health Dept. Verbalized acceptance and understanding.  Screening Tests Health Maintenance  Topic Date Due   Zoster Vaccines- Shingrix (1 of 2) Never done   Medicare Annual Wellness (AWV)  07/12/2021   COVID-19 Vaccine (4 - 2024-25 season) 08/19/2023   Pneumonia Vaccine 74+ Years old (2 of 2 - PCV) 02/01/2024 (Originally 09/03/2019)   INFLUENZA VACCINE  05/16/2024 (Originally 07/19/2023)   MAMMOGRAM  09/03/2035 (Originally 07/11/2013)   DEXA SCAN  Completed   HPV VACCINES  Aged Out   DTaP/Tdap/Td  Discontinued    Health Maintenance  Health Maintenance Due  Topic Date Due   Zoster Vaccines- Shingrix (1 of 2) Never done   Medicare Annual Wellness (AWV)  07/12/2021   COVID-19 Vaccine (4 - 2024-25 season) 08/19/2023    Colorectal cancer screening: No longer required.   Mammogram status: No longer required due to age, history breast cancer.  Bone Density status: Completed 06/2023. Results reflect: Bone density results: OSTEOPOROSIS. Repeat every 2 years.  Lung Cancer Screening: (Low Dose CT Chest recommended if  Age 38-80 years, 20 pack-year currently smoking OR have quit w/in 15years.) does not qualify.    Additional Screening:  Hepatitis C Screening: does not qualify; Completed NA Age  Vision Screening: Recommended annual ophthalmology exams for early detection of glaucoma and other disorders of the eye. Is the patient up to date with their annual eye exam?  Yes  Who is the provider or what is the name of the office in which the patient attends annual eye exams? [facility] If pt is not established with a provider, would they like to be referred to a provider to establish care? No .   Dental Screening: Recommended annual dental exams for proper oral hygiene    Community Resource Referral / Chronic Care Management: CRR required this visit?  No   CCM required this visit?  No     Plan:     I have personally reviewed and noted the following in the patient's chart:   Medical and social history Use of alcohol, tobacco or illicit drugs  Current medications and supplements including opioid prescriptions. Patient is currently taking opioid prescriptions. Information provided to patient regarding non-opioid alternatives. Patient advised to discuss non-opioid treatment plan with their provider. Functional ability and status Nutritional status Physical activity Advanced directives List of other physicians Hospitalizations, surgeries, and ER visits in previous 12 months Vitals Screenings to include cognitive, depression, and falls Referrals and appointments  In addition, I have reviewed and discussed with patient certain preventive protocols, quality metrics, and best practice recommendations. A written personalized care plan for preventive services as well as general preventive health recommendations were provided to patient.     Sydell Axon, LPN   1/61/0960   After Visit Summary: (In Person-Printed) AVS printed and given to the patient  Nurse Notes: None

## 2024-01-16 NOTE — Telephone Encounter (Signed)
Post procedure follow up.  LM

## 2024-01-28 NOTE — Progress Notes (Signed)
PROVIDER NOTE: Information contained herein reflects review and annotations entered in association with encounter. Interpretation of such information and data should be left to medically-trained personnel. Information provided to patient can be located elsewhere in the medical record under "Patient Instructions". Document created using STT-dictation technology, any transcriptional errors that may result from process are unintentional.    Patient: Wanda Martinez  Service Category: E/M  Provider: Oswaldo Done, MD  DOB: 25-Jun-1935  DOS: 01/29/2024  Referring Provider: Dale Houston, MD  MRN: [id number]  Specialty: Interventional Pain Management  PCP: Dale [place] Hill, MD  Type: Established Patient  Setting: Ambulatory outpatient    Location: Office  Delivery: Face-to-face     HPI  [name], a 89 y.o. year old female, is here today because of her Chronic bilateral low back pain without sciatica [M54.50, G89.29]. Wanda Martinez primary complain today is Back Pain (lower)  Pertinent problems: Wanda Martinez has Headache; Parkinson's disease (HCC); Chronic low back pain (1ry area of Pain) (Bilateral) (R>L) w/o sciatica; Lumbar spondylosis; Chronic hip pain (Bilateral); Chronic neck pain; Cervical spondylosis; Chronic cervical radicular pain (Right); Diffuse myofascial pain syndrome; Neurogenic pain; Chronic upper back pain (Right); Myofascial pain syndrome (Right) (cervicothoracic); Lumbar facet syndrome (Bilateral) (R>L); History of breast cancer; Cervical facet hypertrophy; Cervical facet syndrome (HCC); Chronic shoulder pain (Right); Chronic pain syndrome; Chronic sacroiliac joint pain (Left); Chronic sacroiliac joint pain (Right); 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, L4-5, L5-S1) (Right); Lumbar spinal stenosis (with neurogenic claudication) (L3-4); Chronic lower extremity pain (Right); Chronic lumbar radicular pain (Right); Trochanteric bursitis of hip (Bilateral); Spondylosis without  myelopathy or radiculopathy, lumbosacral region; Trigger point with back pain (Right); DDD (degenerative disc disease), lumbosacral; Chronic upper extremity pain (Right); Chronic thoracic back pain (Bilateral) (L>R); Trigger point of thoracic region (Bilateral) (L>R); Other specified dorsopathies, sacral and sacrococcygeal region; Sacroiliac joint dysfunction (Right); Osteoarthritis of sacroiliac joint (Right); Somatic dysfunction of sacroiliac joint (Right); Chronic musculoskeletal pain; Facial pain; Chronic hip pain (Right); Osteoarthritis of hip (Right); Left ear pain; 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HCC); Abnormal MRI, lumbar spine (08/23/2022); Osteoarthritis involving multiple joints; Other spondylosis, sacral and sacrococcygeal region; Lumbar facet hypertrophy (Multilevel) (Bilateral); Chronic knee pain (Right); Osteoarthritis of knee (Right); Trigger point of shoulder region (Right); Osteoarthritis of AC (acromioclavicular) joint (Right); Osteoarthritis of shoulder (Right); Unspecified injury of muscle(s) and tendon(s) of the rotator cuff of shoulder, sequela (Right); Cervicalgia; DDD (degenerative disc disease), lumbar; Lumbar radicular pain (Bilateral); Lumbar foraminal stenosis (Right: L3-4, L4-5, L5-S1) (Left: L2-3); Abnormal MRI, hip (Right) (05/04/2022); Abnormal MRI, knee (Right) (11/29/2023); Tricompartment osteoarthritis of knee (Right); Chronic low back pain (Right) w/o sciatica; Hoffa's fat pad disease (HCC) (Right); Hoffa's pad syndrome (HCC) (Right); Chronic low back pain (Right) w/ sciatica (Right); Lower extremity weakness (Right); Chronic radicular pain of lower back; Chronic sciatica (Right); Lumbosacral radiculopathy at L5 (Right); Low back pain of over 3 months duration; Personal history of breast cancer; Pes anserine bursitis (Right); Infrapatellar bursitis of knee (Right); Traumatic ecchymosis of right lower leg; Lumbar facet joint pain; Medial knee pain (Right); Chronic patellofemoral  pain of knee (Right); Patellofemoral joint pain (Right); Knee hemarthrosis (Right); Pes anserinus bursitis of right knee; and Abnormal MRI, shoulder (Right) (06/21/2023) on their pertinent problem list. Pain Assessment: Severity of Chronic pain is reported as a 8 /10. Location: Back Left, Right/pain radiaties down her right leg. Onset: More than a month ago. Quality: Burning, Aching, Stabbing. Timing: Constant. Modifying factor(s): meds and laying. Vitals:  height is 5\' 3"  (1.6 m) and  weight is 163 lb (73.9 kg). Her temperature is 97 F (36.1 C) (abnormal). Her blood pressure is 144/70 (abnormal) and her pulse is 58 (abnormal). Her oxygen saturation is 100%.  BMI: Estimated body mass index is 28.87 kg/m as calculated from the following:   Height as of this encounter: 5\' 3"  (1.6 m).   Weight as of this encounter: 163 lb (73.9 kg). Last encounter: 12/25/2023. Last procedure: 01/15/2024.  Reason for encounter: post-procedure evaluation and assessment.  The patient has attained 100% relief of the pain in the entire lower back for the duration of local anesthetic.  Once the local anesthetic wore off, she has continued to enjoy an 85% ongoing improvement of the pain in the lower back in the area between the PSIS.  She continues to have pain around the PSIS and below likely to be a contribution from the sacroiliac joints.  Because the patient has multiple areas of pain from the thoracic region down, today we have offered her the possibility of an intrathecal pump implant.  She has indicated being interested in that.  In addition, she has also indicated having a flareup of the right shoulder pain and would like to have that treated as well.  Discussed the use of AI scribe software for clinical note transcription with the patient, who gave verbal consent to proceed.  History of Present Illness   Wanda Martinez is an 89 year old female who presents for pain management.  She has chronic right shoulder pain,  previously managed with a right-sided suprascapular nerve block, which provided significant pain relief during the duration of the numbing medication, reducing her pain by about 75%. However, she continues to experience pain when attempting to raise her right arm above 90 degrees, describing it as painful when trying to touch her ear on the opposite side.  Regarding her lower back pain, she recently had a bilateral lumbar facet block, which resulted in complete pain relief while the area was numb. However, the numbness wore off the next day, and she began to feel pain again, particularly on the outer sides of her back, around the PSIS on both sides. She notes that the inner part of her back feels better, but the sides continue to bother her, although the steroid injection may have provided some relief. She has a history of radiofrequency treatments in this area, but it has been a long time since the last procedure.  She also mentions ongoing issues with her hips, lower back, legs, and knees, indicating a widespread pain problem. No major problems with her heart, lungs, or liver.       Post-procedure evaluation   Type: Lumbar Facet, Medial Branch Block(s) (w/ fluoroscopic mapping)  #8   Laterality: Bilateral  Level: L2, L3, L4, L5, and S1 Medial Branch Level(s). Injecting these levels blocks the L3-4, L4-5, and L5-S1 lumbar facet joints. Imaging: Fluoroscopic guidance Spinal ([id number]) Anesthesia: Local anesthesia (1-2% Lidocaine) Anxiolysis: IV Versed 2.0 mg Sedation: Moderate Sedation Fentanyl 0.5 mL (25 mcg) DOS: 01/15/2024 Performed by: Oswaldo Done, MD  Primary Purpose: Diagnostic/Therapeutic Indications: Low back pain severe enough to impact quality of life or function. 1. Chronic low back pain (1ry area of Pain) (Bilateral) (R>L) w/o sciatica   2. Lumbar facet joint pain   3. Lumbar facet hypertrophy (Multilevel) (Bilateral)   4. Lumbar facet syndrome (Bilateral) (R>L)   5.  Spondylosis without myelopathy or radiculopathy, lumbosacral region   6. Abnormal MRI, lumbar spine (08/23/2022)    NAS-11  Pain score:   Pre-procedure: 9 /10   Post-procedure: 0-No pain/10     Effectiveness:  Initial hour after procedure: 100 %. Subsequent 4-6 hours post-procedure: 85 %. Analgesia past initial 6 hours: 85 %. Ongoing improvement:  Analgesic: The patient indicates having an ongoing 85% improvement of her low back pain. Function: Wanda Martinez reports improvement in function ROM: Wanda Martinez reports improvement in ROM  Pharmacotherapy Assessment  Analgesic: Oxycodone IR 5 mg, 1 tab PO q 8 hrs (15 mg/day of oxycodone) MME/day: 22.5 mg/day.   Monitoring: [place] PMP: PDMP reviewed during this encounter.       Pharmacotherapy: No side-effects or adverse reactions reported. Compliance: No problems identified. Effectiveness: Clinically acceptable.  No notes on file  No results found for: "CBDTHCR" No results found for: "D8THCCBX" No results found for: "D9THCCBX"  UDS:  Summary  Date Value Ref Range Status  07/31/2023 Note  Final    Comment:    ==================================================================== [employer] Select 13 (MW) ==================================================================== Test                             Result       Flag       Units  Drug Present and Declared for Prescription Verification   Oxycodone                      839          EXPECTED   ng/mg creat   Noroxycodone                   3412         EXPECTED   ng/mg creat    Sources of oxycodone include scheduled prescription medications.    Noroxycodone is an expected metabolite of oxycodone.  Drug Present not Declared for Prescription Verification   Hydrocodone                    340          UNEXPECTED ng/mg creat   Dihydrocodeine                 32           UNEXPECTED ng/mg creat   Norhydrocodone                 403          UNEXPECTED ng/mg creat    Sources of hydrocodone  include scheduled prescription medications.    Dihydrocodeine and norhydrocodone are expected metabolites of    hydrocodone. Dihydrocodeine is also available as a scheduled    prescription medication.  ==================================================================== Test                      Result    Flag   Units      Ref Range   Creatinine              208              mg/dL      >=19 ==================================================================== Declared Medications:  The flagging and interpretation on this report are based on the  following declared medications.  Unexpected results may arise from  inaccuracies in the declared medications.   **Note: The testing scope of this panel includes these medications:   Oxycodone (Roxicodone)   **Note: The testing scope of this panel does not include the  following reported medications:  Amlodipine (Norvasc)  Aspirin  Calcium  Chondroitin  Ciprofloxacin (Cipro)  Citalopram (Celexa)  Cyclobenzaprine (Flexeril)  Donepezil (Aricept)  Erythromycin  Gabapentin (Neurontin)  Glucosamine  Hydrocortisone (Anusol-HC)  Letrozole (Femara)  Losartan (Cozaar)  Lubiprostone (Amitiza)  Magnesium (Mag-Ox)  Melatonin  Metoprolol (Toprol)  Mirabegron (Myrbetriq)  Multivitamin  Naloxone (Narcan)  Nitrofurantoin (Macrobid)  Omega-3 Fatty Acids  Pantoprazole (Protonix)  Rosuvastatin (Crestor) ==================================================================== For clinical consultation, please call [phone]. ====================================================================       ROS  Constitutional: Denies any fever or chills Gastrointestinal: No reported hemesis, hematochezia, vomiting, or acute GI distress Musculoskeletal: Denies any acute onset joint swelling, redness, loss of ROM, or weakness Neurological: No reported episodes of acute onset apraxia, aphasia, dysarthria, agnosia, amnesia, paralysis, loss of  coordination, or loss of consciousness  Medication Review  Glucosamine-Chondroitin, Omega-3, amLODipine, aspirin EC, calcium carbonate, citalopram, cyclobenzaprine, donepezil, gabapentin, hydrochlorothiazide, letrozole, losartan, lubiprostone, magnesium oxide, melatonin, metoprolol succinate, mirabegron ER, multivitamin, naloxone, nitrofurantoin (macrocrystal-monohydrate), oxyCODONE, pantoprazole, and rosuvastatin  History Review  Allergy: Wanda Martinez is allergic to sulfa antibiotics and vesicare [solifenacin]. Drug: Wanda Martinez  reports no history of drug use. Alcohol:  reports no history of alcohol use. Tobacco:  reports that she has never smoked. She has never been exposed to tobacco smoke. She has never used smokeless tobacco. Social: Wanda Martinez  reports that she has never smoked. She has never been exposed to tobacco smoke. She has never used smokeless tobacco. She reports that she does not drink alcohol and does not use drugs. Medical:  has a past medical history of Acute postoperative pain (10/25/2017), Anemia, Arm pain (07/26/2015), Arthritis, Arthritis, degenerative (03/26/2014), Back pain (11/01/2013), Bladder infection (06/2018), Breast cancer (HCC), Breast cancer (HCC), CHEST PAIN (04/29/2010), Chronic cystitis, Cystocele (02/22/2013), Cystocele, midline (08/19/2013), Degeneration of intervertebral disc of lumbosacral region (03/26/2014), DYSPNEA (04/29/2010), Enthesopathy of hip (03/26/2014), GERD (gastroesophageal reflux disease), Hiatal hernia, HTN (hypertension), Hypokalemia (06/2018), Hyponatremia (06/2018), LBP (low back pain) (03/26/2014), Neck pain (11/01/2013), Parkinson disease (HCC), Pneumonia (06/2018), Sinusitis (02/07/2015), Skin lesions (07/12/2014), and Urinary incontinence. Surgical: Wanda Martinez  has a past surgical history that includes Tonsillectomy and adenoidectomy (1979); Vesicovaginal fistula closure w/ TAH (1983); Breast surgery (1986); Breast enhancement surgery (1987); Breast implant  removal; Breast implant removal (Right, 08/29/2012); Mastectomy (08/2012); Abdominal hysterectomy; Colonoscopy with propofol (N/A, 09/13/2016); Breast biopsy (2013); and Blepharoplasty. Family: family history includes Colon polyps in her father; Diabetes in her father; Parkinson's disease in her mother; Stroke in her father and mother.  Laboratory Chemistry Profile   Renal Lab Results  Component Value Date   BUN 30 (H) 12/05/2023   CREATININE 0.93 12/05/2023   BCR 20 06/11/2020   GFR 55.80 (L) 12/05/2023   GFRAA >60 07/27/2020   GFRNONAA 47 (L) 09/26/2023    Hepatic Lab Results  Component Value Date   AST 25 09/11/2023   ALT 19 09/11/2023   ALBUMIN 3.8 09/11/2023   ALKPHOS 32 (L) 09/11/2023    Electrolytes Lab Results  Component Value Date   NA 134 (L) 12/05/2023   K 4.3 12/05/2023   CL 100 12/05/2023   CALCIUM 9.0 12/05/2023   MG 2.2 07/17/2018    Bone Lab Results  Component Value Date   VD25OH 66.95 09/26/2022   25OHVITD1 42 07/17/2018   25OHVITD2 <1.0 07/17/2018   25OHVITD3 42 07/17/2018    Inflammation (CRP: Acute Phase) (ESR: Chronic Phase) Lab Results  Component Value Date   CRP 3 07/17/2018   ESRSEDRATE 27 07/17/2018  Note: Above Lab results reviewed.  Recent Imaging Review  DG PAIN CLINIC C-ARM 1-60 MIN NO REPORT Fluoro was used, but no Radiologist interpretation will be provided.  Please refer to "NOTES" tab for provider progress note. Note: Reviewed        Physical Exam  General appearance: Well nourished, well developed, and well hydrated. In no apparent acute distress Mental status: Alert, oriented x 3 (person, place, & time)       Respiratory: No evidence of acute respiratory distress Eyes: PERLA Vitals: BP (!) 144/70   Pulse (!) 58   Temp (!) 97 F (36.1 C)   Ht 5\' 3"  (1.6 m)   Wt 163 lb (73.9 kg)   LMP 12/18/1981   SpO2 100%   BMI 28.87 kg/m  BMI: Estimated body mass index is 28.87 kg/m as calculated from the following:    Height as of this encounter: 5\' 3"  (1.6 m).   Weight as of this encounter: 163 lb (73.9 kg). Ideal: Ideal body weight: 52.4 kg (115 lb 8.3 oz) Adjusted ideal body weight: 61 kg (134 lb 8.2 oz)  Physical Exam   MUSCULOSKELETAL: Right shoulder pain on elevation, limited range of motion to approximately 90 degrees, unable to touch opposite ear.       Assessment   Diagnosis Status  1. Chronic low back pain (1ry area of Pain) (Bilateral) (R>L) w/o sciatica   2. Lumbar facet joint pain   3. Chronic shoulder pain (Right)   4. Lumbar facet hypertrophy (Multilevel) (Bilateral)   5. Lumbar facet syndrome (Bilateral) (R>L)   6. Spondylosis without myelopathy or radiculopathy, lumbosacral region   7. Postop check   8. Osteoarthritis of shoulder (Right)   9. Abnormal MRI, shoulder (Right) (06/21/2023)    Improved Improved Worsened   Updated Problems: No problems updated.  Plan of Care  Problem-specific:  Assessment and Plan    Right Shoulder Pain   Chronic right shoulder pain limits range of motion, with significant relief previously achieved from a right-sided suprascapular nerve block, reducing pain by 75%. Arm movement beyond 90 degrees remains painful. Plan to proceed with radiofrequency ablation of the right suprascapular nerve for longer-term relief. Risks, benefits, and alternatives, including potential temporary numbness, infection, and repeat procedures, were discussed. She wishes to proceed to improve quality of life. Schedule radiofrequency ablation.  Chronic Lower Back Pain   Persistent lower back pain, especially in the sacroiliac joint area bilaterally, returned after temporary relief from a previous bilateral lumbar facet block. Considering repeating radiofrequency ablation due to the recurrence of pain. Discussed potential benefits and risks, including temporary numbness and infection. She is open to this approach. Repeat radiofrequency ablation in the lower back area  (PSIS).  Generalized Chronic Pain   Chronic pain affects multiple areas, including hips, lower back, legs, and knees. Discussed the potential benefit of an intrathecal pain pump for comprehensive pain management. Explained the process, including a test injection of fentanyl to assess response and the need for surgical clearance. Discussed risks such as infection, pump malfunction, and the need for regular follow-ups. She is willing to proceed to improve quality of life. Provide information about the intrathecal pain pump, request surgical clearance from primary care physician Dr. Dale Meadowlands, and schedule a test injection of fentanyl in the spinal area to assess response and potential benefit.  Follow-up   Follow-up appointment on February 13, 2024, for medication refill and further evaluation.      Wanda Martinez has a current medication  list which includes the following long-term medication(s): amlodipine, calcium carbonate, citalopram, citalopram, cyclobenzaprine, donepezil, gabapentin, losartan, magnesium oxide, metoprolol succinate, mirabegron er, oxycodone, pantoprazole, and rosuvastatin.  Pharmacotherapy (Medications Ordered): No orders of the defined types were placed in this encounter.  Orders:  Orders Placed This Encounter  Procedures   Radiofrequency Shoulder Joint    For shoulder pain.    Standing Status:   Future    Expiration Date:   04/27/2024    Scheduling Instructions:     Procedure: Suprascapular Nerve Radiofrequency Ablation     Laterality: Right-sided     Level(s): Suprascapular nerve     Sedation: Patient's choice     Scheduling Timeframe: As soon as pre-approved    Where will this procedure be performed?:   [facility]   Nursing Instructions:    Please complete this patient's postprocedure evaluation.    Scheduling Instructions:     Please complete this patient's postprocedure evaluation.   Follow-up plan:   Return for [facility]): (R)  Suprascapular RFA #1.      Interventional Therapies  Risk Factors  Considerations  Medical Comorbidities:  Parkinson's disease  Hx right breast cancer  OIC  SOB  CKD Stage 3b  GERD  depression  osteopenia/osteoporosis  PSVT  Memory impairment      Planned  Pending:   Therapeutic right sided suprascapular nerve RFA #1    Under consideration:  Therapeutic right sided suprascapular nerve RFA #1  Possible intrathecal pump trial and permanent implant    Completed:   Diagnostic/therapeutic right suprascapular NB x2 (10/16/2023) (100/100/75 x 5 days/0) Therapeutic right trapezius TPI x4 (07/17/2023) (100/100/0/0)  Therapeutic right thoracic TPI x2 (10/17/2018) (DKFUA)  Therapeutic right QL & ESM TPI x1 (02/12/2018)   Diagnostic right SI joint Blk x3 (12/21/2020)  (DKFUA)  Therapeutic right SI joint RFA x4 (03/01/2023) (100/100/70/70)   Therapeutic right lumbar facet MBB (FCT: L3-4,L4-5,L5-S1) x6 (01/15/2024) (100/100/85/85)  Therapeutic right lumbar facet MBB (FCT: L4-5, L5-S1) x7 (11/01/2023) (100/100/70/R:70)  Therapeutic right lumbar facet MBB (FCT: L5-S1) x1 (08/29/2022) (100/100/100/0)  Therapeutic right lumbar facet RFA x4 (03/01/2023) (100/100/70/70)   Therapeutic bilateral L5 and L2 TFESI x1 (07/03/2023) (100/90/50/L:100R:20))  Therapeutic right L3 and L4 TFESI x1 (07/27/2022) (100/100/75/50)  Therapeutic right L5 TFESI x4 (02/14/2022) (100/100/70/75)  Therapeutic right S1 TFESI x1 (11/04/2020) (100/100/20/<50)   Therapeutic right L3-4 LESI x2 (11/04/2020) (100/100/20/<50)  Therapeutic right L4-5 LESI x2 (03/30/2022) (100/100/75/75)  Therapeutic right L5-S1 LESI x2 (12/04/2019) (100/100/50/75)  Therapeutic midline L2-3 LESI x1 (10/04/2017) (100/100/85/>50)   Diagnostic bilateral superficial trochanteric bursa injec. x1 (04/30/2018) (DKFUA)  Diagnostic right IA hip injection x1 (02/13/2019) (DKFUA)   Diagnostic/therapeutic right IA steroid knee inj. x5 (07/31/2023)  (100/100/100/80)  Therapeutic right (Zilretta) knee injection x3 (11/13/2023) (100/100/75/75) Therapeutic right (Monovisc) knee injection x1 (06/07/2021) (100/100/100/75)  Therapeutic right pes anserine bursa inj. x2 (12/25/2023) (100/100/50/50)  Therapeutic left thoracic TPI x1 (10/17/2018) (DKFUA)  Diagnostic left SI joint Blk x2 (04/12/2023) (100/90/20/<25)   Therapeutic left lumbar facet MBB (L2-S1) (FCT: L3-4,L4-5,L5-S1) x6 (01/15/2024) (100/100/85/85)  Therapeutic left lumbar facet MBB (FCT: L4-5, L5-S1) x7 (11/01/2023) (100/100/70/L: 85)  Therapeutic left lumbar facet MBB (L4-S1) (FCT: L5-S1) x1 (08/29/2022) (100/100/100/100)  Therapeutic left lumbar facet RFA x2 (11/30/2022) (100/60/90/>75)    Therapeutic  Palliative (PRN) options:   Do not schedule any procedures without a proper preprocedure evaluation.   Completed by other providers:   Therapeutic right knee (ORTHOVISC) inj. x3 (05/18/2023, 05/23/2023, 05/30/2023) by Aram Candela, MD [facility]) Therapeutic left  knee (Kenalog) inj. x1 (08/25/2020) by Novella Olive, MD [facility])   Pharmacotherapy  Nonopioids transfer 12/21/2020: Gabapentin      Recent Visits Date Type Provider Dept  01/15/24 Procedure visit [name], MD [facility]  12/25/23 Office Visit [name], MD [facility]  12/06/23 Procedure visit [name], MD [facility]  11/28/23 Office Visit [name], MD [facility]  11/13/23 Procedure visit [name], MD [facility]  11/12/23 Office Visit [name], MD [facility]  11/01/23 Procedure visit [name], MD [facility]  Showing recent visits within past 90 days and meeting all other requirements Today's Visits Date Type Provider Dept  01/29/24 Office Visit [name], MD [facility]  Showing today's visits and meeting all other requirements Future  Appointments Date Type Provider Dept  01/31/24 Appointment [name], MD [facility]  02/11/24 Appointment [name], MD [facility]  Showing future appointments within next 90 days and meeting all other requirements  I discussed the assessment and treatment plan with the patient. The patient was provided an opportunity to ask questions and all were answered. The patient agreed with the plan and demonstrated an understanding of the instructions.  Patient advised to call back or seek an in-person evaluation if the symptoms or condition worsens.  Duration of encounter: 30 minutes.  Total time on encounter, as per AMA guidelines included both the face-to-face and non-face-to-face time personally spent by the physician and/or other qualified health care professional(s) on the day of the encounter (includes time in activities that require the physician or other qualified health care professional and does not include time in activities normally performed by clinical staff). Physician's time may include the following activities when performed: Preparing to see the patient (e.g., pre-charting review of records, searching for previously ordered imaging, lab work, and nerve conduction tests) Review of prior analgesic pharmacotherapies. Reviewing PMP Interpreting ordered tests (e.g., lab work, imaging, nerve conduction tests) Performing post-procedure evaluations, including interpretation of diagnostic procedures Obtaining and/or reviewing separately obtained history Performing a medically appropriate examination and/or evaluation Counseling and educating the patient/family/caregiver Ordering medications, tests, or procedures Referring and communicating with other health care professionals (when not separately reported) Documenting clinical information in the electronic or other health record Independently interpreting results (not separately reported) and  communicating results to the patient/ family/caregiver Care coordination (not separately reported)  Note by: Oswaldo Done, MD Date: 01/29/2024; Time: 1:59 PM

## 2024-01-29 ENCOUNTER — Encounter: Payer: Self-pay | Admitting: Pain Medicine

## 2024-01-29 ENCOUNTER — Other Ambulatory Visit: Payer: Self-pay | Admitting: *Deleted

## 2024-01-29 ENCOUNTER — Telehealth: Payer: Self-pay

## 2024-01-29 ENCOUNTER — Ambulatory Visit: Payer: Medicare Other | Attending: Pain Medicine | Admitting: Pain Medicine

## 2024-01-29 VITALS — BP 144/70 | HR 58 | Temp 97.0°F | Ht 63.0 in | Wt 163.0 lb

## 2024-01-29 DIAGNOSIS — D649 Anemia, unspecified: Secondary | ICD-10-CM

## 2024-01-29 DIAGNOSIS — Z09 Encounter for follow-up examination after completed treatment for conditions other than malignant neoplasm: Secondary | ICD-10-CM | POA: Insufficient documentation

## 2024-01-29 DIAGNOSIS — M545 Low back pain, unspecified: Secondary | ICD-10-CM | POA: Insufficient documentation

## 2024-01-29 DIAGNOSIS — M810 Age-related osteoporosis without current pathological fracture: Secondary | ICD-10-CM

## 2024-01-29 DIAGNOSIS — M47816 Spondylosis without myelopathy or radiculopathy, lumbar region: Secondary | ICD-10-CM | POA: Insufficient documentation

## 2024-01-29 DIAGNOSIS — C50211 Malignant neoplasm of upper-inner quadrant of right female breast: Secondary | ICD-10-CM

## 2024-01-29 DIAGNOSIS — M19011 Primary osteoarthritis, right shoulder: Secondary | ICD-10-CM | POA: Insufficient documentation

## 2024-01-29 DIAGNOSIS — R936 Abnormal findings on diagnostic imaging of limbs: Secondary | ICD-10-CM | POA: Insufficient documentation

## 2024-01-29 DIAGNOSIS — M25511 Pain in right shoulder: Secondary | ICD-10-CM | POA: Diagnosis not present

## 2024-01-29 DIAGNOSIS — M5459 Other low back pain: Secondary | ICD-10-CM | POA: Insufficient documentation

## 2024-01-29 DIAGNOSIS — G8929 Other chronic pain: Secondary | ICD-10-CM | POA: Diagnosis not present

## 2024-01-29 DIAGNOSIS — M47817 Spondylosis without myelopathy or radiculopathy, lumbosacral region: Secondary | ICD-10-CM | POA: Insufficient documentation

## 2024-01-29 NOTE — Telephone Encounter (Signed)
I am going to have my nurse schedule an appt for pre op clearance.  Thanks.  Bethann Berkshire, please call Ms Heard and see if she is agreeable for pre op evaluation. If so, please schedule an appt.

## 2024-01-29 NOTE — Telephone Encounter (Signed)
Good afternoon Dr Lorin Picket.  I am sending this message on behalf of Dr Laban Emperor.  Mrs Louks is considering an Intrathecal pain pump implantation for chronic pain relief and Dr Laban Emperor would like for you to give a medical clearance for her to have this pump implanted in the OR under general anesthesia.  Thank you so much, Theodoro Doing RN

## 2024-01-29 NOTE — Patient Instructions (Signed)
______________________________________________________________________    Procedure instructions  Stop blood-thinners  Do not eat or drink fluids (other than water) for 6 hours before your procedure  No water for 2 hours before your procedure  Take your blood pressure medicine with a sip of water  Arrive 30 minutes before your appointment  If sedation is planned, bring suitable driver. Pennie Banter, Benedetto Goad, & public transportation are NOT APPROVED)  Carefully read the "Preparing for your procedure" detailed instructions  If you have questions call us at [phone]  Procedure appointments are for procedures only. NO medication refills or new problem evaluations.   ______________________________________________________________________      ______________________________________________________________________    Preparing for your procedure  Appointments: If you think you may not be able to keep your appointment, call 24-48 hours in advance to cancel. We need time to make it available to others.  Procedure visits are for procedures only. During your procedure appointment there will be: NO Prescription Refills*. NO medication changes or discussions*. NO discussion of disability issues*. NO unrelated pain problem evaluations*. NO evaluations to order other pain procedures*. *These will be addressed at a separate and distinct evaluation encounter on the provider's evaluation schedule and not during procedure days.  Instructions: Food intake: Avoid eating anything solid for at least 8 hours prior to your procedure. Clear liquid intake: You may take clear liquids such as water up to 2 hours prior to your procedure. (No carbonated drinks. No soda.) Transportation: Unless otherwise stated by your physician, bring a driver. (Driver cannot be a Market researcher, Pharmacist, community, or any other form of public transportation.) Morning Medicines: Except for blood thinners, take all of your other morning  medications with a sip of water. Make sure to take your heart and blood pressure medicines. If your blood pressure's lower number is above 100, the case will be rescheduled. Blood thinners: Make sure to stop your blood thinners as instructed.  If you take a blood thinner, but were not instructed to stop it, call our office [phone] and ask to talk to a nurse. Not stopping a blood thinner prior to certain procedures could lead to serious complications. Diabetics on insulin: Notify the staff so that you can be scheduled 1st case in the morning. If your diabetes requires high dose insulin, take only  of your normal insulin dose the morning of the procedure and notify the staff that you have done so. Preventing infections: Shower with an antibacterial soap the morning of your procedure.  Build-up your immune system: Take 1000 mg of Vitamin C with every meal (3 times a day) the day prior to your procedure. Antibiotics: Inform the nursing staff if you are taking any antibiotics or if you have any conditions that may require antibiotics prior to procedures. (Example: recent joint implants)   Pregnancy: If you are pregnant make sure to notify the nursing staff. Not doing so may result in injury to the fetus, including death.  Sickness: If you have a cold, fever, or any active infections, call and cancel or reschedule your procedure. Receiving steroids while having an infection may result in complications. Arrival: You must be in the facility at least 30 minutes prior to your scheduled procedure. Tardiness: Your scheduled time is also the cutoff time. If you do not arrive at least 15 minutes prior to your procedure, you will be rescheduled.  Children: Do not bring any children with you. Make arrangements to keep them home. Dress appropriately: There is always a possibility that your clothing may get  soiled. Avoid long dresses. Valuables: Do not bring any jewelry or valuables.  Reasons to call and  reschedule or cancel your procedure: (Following these recommendations will minimize the risk of a serious complication.) Surgeries: Avoid having procedures within 2 weeks of any surgery. (Avoid for 2 weeks before or after any surgery). Flu Shots: Avoid having procedures within 2 weeks of a flu shots or . (Avoid for 2 weeks before or after immunizations). Barium: Avoid having a procedure within 7-10 days after having had a radiological study involving the use of radiological contrast. (Myelograms, Barium swallow or enema study). Heart attacks: Avoid any elective procedures or surgeries for the initial 6 months after a "Myocardial Infarction" (Heart Attack). Blood thinners: It is imperative that you stop these medications before procedures. Let us know if you if you take any blood thinner.  Infection: Avoid procedures during or within two weeks of an infection (including chest colds or gastrointestinal problems). Symptoms associated with infections include: Localized redness, fever, chills, night sweats or profuse sweating, burning sensation when voiding, cough, congestion, stuffiness, runny nose, sore throat, diarrhea, nausea, vomiting, cold or Flu symptoms, recent or current infections. It is specially important if the infection is over the area that we intend to treat. Heart and lung problems: Symptoms that may suggest an active cardiopulmonary problem include: cough, chest pain, breathing difficulties or shortness of breath, dizziness, ankle swelling, uncontrolled high or unusually low blood pressure, and/or palpitations. If you are experiencing any of these symptoms, cancel your procedure and contact your primary care physician for an evaluation.  Remember:  Regular Business hours are:  Monday to Thursday 8:00 AM to 4:00 PM  Provider's Schedule: [name], MD:  Procedure days: Tuesday and Thursday 7:30 AM to 4:00 PM  Edward Jolly, MD:  Procedure days: Monday and Wednesday 7:30 AM to 4:00  PM Last  Updated: 11/27/2023 ______________________________________________________________________      ______________________________________________________________________    General Risks and Possible Complications  Patient Responsibilities: It is important that you read this as it is part of your informed consent. It is our duty to inform you of the risks and possible complications associated with treatments offered to you. It is your responsibility as a patient to read this and to ask questions about anything that is not clear or that you believe was not covered in this document.  Patient's Rights: You have the right to refuse treatment. You also have the right to change your mind, even after initially having agreed to have the treatment done. However, under this last option, if you wait until the last second to change your mind, you may be charged for the materials used up to that point.  Introduction: Medicine is not an Visual merchandiser. Everything in Medicine, including the lack of treatment(s), carries the potential for danger, harm, or loss (which is by definition: Risk). In Medicine, a complication is a secondary problem, condition, or disease that can aggravate an already existing one. All treatments carry the risk of possible complications. The fact that a side effects or complications occurs, does not imply that the treatment was conducted incorrectly. It must be clearly understood that these can happen even when everything is done following the highest safety standards.  No treatment: You can choose not to proceed with the proposed treatment alternative. The "PRO(s)" would include: avoiding the risk of complications associated with the therapy. The "CON(s)" would include: not getting any of the treatment benefits. These benefits fall under one of three categories: diagnostic; therapeutic; and/or  palliative. Diagnostic benefits include: getting information which can ultimately lead to  improvement of the disease or symptom(s). Therapeutic benefits are those associated with the successful treatment of the disease. Finally, palliative benefits are those related to the decrease of the primary symptoms, without necessarily curing the condition (example: decreasing the pain from a flare-up of a chronic condition, such as incurable terminal cancer).  General Risks and Complications: These are associated to most interventional treatments. They can occur alone, or in combination. They fall under one of the following six (6) categories: no benefit or worsening of symptoms; bleeding; infection; nerve damage; allergic reactions; and/or death. No benefits or worsening of symptoms: In Medicine there are no guarantees, only probabilities. No healthcare provider can ever guarantee that a medical treatment will work, they can only state the probability that it may. Furthermore, there is always the possibility that the condition may worsen, either directly, or indirectly, as a consequence of the treatment. Bleeding: This is more common if the patient is taking a blood thinner, either prescription or over the counter (example: Goody Powders, Fish oil, Aspirin, Garlic, etc.), or if suffering a condition associated with impaired coagulation (example: Hemophilia, cirrhosis of the liver, low platelet counts, etc.). However, even if you do not have one on these, it can still happen. If you have any of these conditions, or take one of these drugs, make sure to notify your treating physician. Infection: This is more common in patients with a compromised immune system, either due to disease (example: diabetes, cancer, human immunodeficiency virus [HIV], etc.), or due to medications or treatments (example: therapies used to treat cancer and rheumatological diseases). However, even if you do not have one on these, it can still happen. If you have any of these conditions, or take one of these drugs, make sure to notify  your treating physician. Nerve Damage: This is more common when the treatment is an invasive one, but it can also happen with the use of medications, such as those used in the treatment of cancer. The damage can occur to small secondary nerves, or to large primary ones, such as those in the spinal cord and brain. This damage may be temporary or permanent and it may lead to impairments that can range from temporary numbness to permanent paralysis and/or brain death. Allergic Reactions: Any time a substance or material comes in contact with our body, there is the possibility of an allergic reaction. These can range from a mild skin rash (contact dermatitis) to a severe systemic reaction (anaphylactic reaction), which can result in death. Death: In general, any medical intervention can result in death, most of the time due to an unforeseen complication. ______________________________________________________________________     ______________________________________________________________________    Patient information sheet: intrathecal morphine pump  What is an Intrathecal Morphine Pump? An intrathecal morphine pump is a medical device that delivers morphine directly into the fluid surrounding your spinal cord. It is used to manage severe chronic pain that has not responded well to other treatments. The pump is implanted under the skin and connected to a catheter placed in the intrathecal space around the spinal cord. This allows for smaller doses of medication with fewer side effects compared to oral or intravenous pain management.  Benefits of an Intrathecal Morphine Pump Provides targeted pain relief Reduces the need for oral or intravenous pain medications Can improve overall function and quality of life Lower doses of medication may lead to fewer systemic side effects  Potential Risks and Complications  While intrathecal morphine pumps can be effective, they are associated with several risks  and potential complications, including:  Surgical risks: Infection at the implantation site Bleeding or hematoma formation Spinal fluid leak, which can cause headaches Damage to nerves or the spinal cord  Device-related complications: Catheter dislodgement or blockage, leading to loss of pain relief Pump malfunction or failure, requiring surgical replacement Battery depletion, necessitating pump replacement Over- or under-dosing due to pump or programming issues  Medication-related side effects: Respiratory depression (slow or shallow breathing) Drowsiness or sedation Nausea and vomiting Itching Constipation Urinary retention Tolerance or dependence over time  Serious complications: Intrathecal granuloma formation: A mass can develop at the tip of the catheter, potentially causing severe pain or neurological symptoms. Withdrawal symptoms: If the pump fails or medication is suddenly stopped, withdrawal symptoms can occur, including agitation, sweating, increased pain, and in severe cases, seizures. Overdose: If too much morphine is delivered, it can lead to severe drowsiness, respiratory depression, or even coma.  Precautions and Follow-Up Care Regular monitoring and follow-up visits with your healthcare provider are necessary to ensure the pump is functioning properly. Be aware of any signs of infection (redness, swelling, fever) and report them to your doctor immediately. Keep scheduled refills to avoid withdrawal symptoms. Inform healthcare providers about your pump before undergoing any medical procedures.  Conclusion  An intrathecal morphine pump can be an effective treatment for chronic pain, but it requires careful management to minimize risks. Discuss all potential benefits and complications with your healthcare provider to determine if this treatment is right for you.  For more information, consult your pain management specialist or  surgeon.    ______________________________________________________________________

## 2024-01-30 ENCOUNTER — Encounter: Payer: Self-pay | Admitting: Oncology

## 2024-01-30 ENCOUNTER — Inpatient Hospital Stay: Payer: Medicare Other | Attending: Oncology

## 2024-01-30 ENCOUNTER — Inpatient Hospital Stay (HOSPITAL_BASED_OUTPATIENT_CLINIC_OR_DEPARTMENT_OTHER): Payer: Medicare Other | Admitting: Oncology

## 2024-01-30 ENCOUNTER — Inpatient Hospital Stay: Payer: Medicare Other

## 2024-01-30 VITALS — BP 134/79 | HR 56 | Temp 97.1°F | Wt 163.0 lb

## 2024-01-30 DIAGNOSIS — M810 Age-related osteoporosis without current pathological fracture: Secondary | ICD-10-CM

## 2024-01-30 DIAGNOSIS — D649 Anemia, unspecified: Secondary | ICD-10-CM

## 2024-01-30 DIAGNOSIS — D631 Anemia in chronic kidney disease: Secondary | ICD-10-CM | POA: Diagnosis not present

## 2024-01-30 DIAGNOSIS — N186 End stage renal disease: Secondary | ICD-10-CM | POA: Insufficient documentation

## 2024-01-30 DIAGNOSIS — C50211 Malignant neoplasm of upper-inner quadrant of right female breast: Secondary | ICD-10-CM

## 2024-01-30 DIAGNOSIS — R300 Dysuria: Secondary | ICD-10-CM

## 2024-01-30 LAB — URINALYSIS, COMPLETE (UACMP) WITH MICROSCOPIC
Bilirubin Urine: NEGATIVE
Glucose, UA: NEGATIVE mg/dL
Hgb urine dipstick: NEGATIVE
Ketones, ur: NEGATIVE mg/dL
Leukocytes,Ua: NEGATIVE
Nitrite: NEGATIVE
Protein, ur: NEGATIVE mg/dL
Specific Gravity, Urine: 1.016 (ref 1.005–1.030)
pH: 5 (ref 5.0–8.0)

## 2024-01-30 LAB — BASIC METABOLIC PANEL - CANCER CENTER ONLY
Anion gap: 6 (ref 5–15)
BUN: 27 mg/dL — ABNORMAL HIGH (ref 8–23)
CO2: 25 mmol/L (ref 22–32)
Calcium: 9.1 mg/dL (ref 8.9–10.3)
Chloride: 103 mmol/L (ref 98–111)
Creatinine: 0.84 mg/dL (ref 0.44–1.00)
GFR, Estimated: >60 mL/min (ref >60)
Glucose, Bld: 89 mg/dL (ref 70–99)
Potassium: 3.7 mmol/L (ref 3.5–5.1)
Sodium: 134 mmol/L — ABNORMAL LOW (ref 135–145)

## 2024-01-30 LAB — CBC WITH DIFFERENTIAL (CANCER CENTER ONLY)
Abs Immature Granulocytes: 0.03 10*3/uL (ref 0.00–0.07)
Basophils Absolute: 0.1 10*3/uL (ref 0.0–0.1)
Basophils Relative: 1 %
Eosinophils Absolute: 0.1 10*3/uL (ref 0.0–0.5)
Eosinophils Relative: 2 %
HCT: 34.8 % — ABNORMAL LOW (ref 36.0–46.0)
Hemoglobin: 11.3 g/dL — ABNORMAL LOW (ref 12.0–15.0)
Immature Granulocytes: 1 %
Lymphocytes Relative: 21 %
Lymphs Abs: 1.2 10*3/uL (ref 0.7–4.0)
MCH: 29.8 pg (ref 26.0–34.0)
MCHC: 32.5 g/dL (ref 30.0–36.0)
MCV: 91.8 fL (ref 80.0–100.0)
Monocytes Absolute: 0.3 10*3/uL (ref 0.1–1.0)
Monocytes Relative: 5 %
Neutro Abs: 4.2 10*3/uL (ref 1.7–7.7)
Neutrophils Relative %: 70 %
Platelet Count: 176 10*3/uL (ref 150–400)
RBC: 3.79 MIL/uL — ABNORMAL LOW (ref 3.87–5.11)
RDW: 14.8 % (ref 11.5–15.5)
WBC Count: 6 10*3/uL (ref 4.0–10.5)
nRBC: 0 % (ref 0.0–0.2)

## 2024-01-30 MED ORDER — ZOLEDRONIC ACID 4 MG/5ML IV CONC
3.5000 mg | Freq: Once | INTRAVENOUS | Status: AC
  Administered 2024-01-30: 3.5 mg via INTRAVENOUS
  Filled 2024-01-30: qty 4.38

## 2024-01-30 MED ORDER — SODIUM CHLORIDE 0.9 % IV SOLN
INTRAVENOUS | Status: DC
  Filled 2024-01-30: qty 250

## 2024-01-30 MED ORDER — SODIUM CHLORIDE 0.9% FLUSH
10.0000 mL | Freq: Once | INTRAVENOUS | Status: DC
  Filled 2024-01-30: qty 10

## 2024-01-30 NOTE — Progress Notes (Unsigned)
PROVIDER NOTE: Interpretation of information contained herein should be left to medically-trained personnel. Specific patient instructions are provided elsewhere under "Patient Instructions" section of medical record. This document was created in part using STT-dictation technology, any transcriptional errors that may result from this process are unintentional.  Patient: Wanda Martinez Type: Established DOB: 06-28-1935 MRN: [id number] PCP: Dale [place], MD  Service: Procedure DOS: 01/31/2024 Setting: Ambulatory Location: Ambulatory outpatient facility Delivery: Face-to-face Provider: Oswaldo Done, MD Specialty: Interventional Pain Management Specialty designation: 09 Location: Outpatient facility Ref. Prov.: Dale [place], MD       Interventional Therapy   Procedure: Suprascapular nerve Radiofrequency Ablation (RFA) #1  Laterality: Right  Level: Superior to scapular spine, lateral to supraspinatus fossa (Suprascapular notch).  Imaging: Fluoroscopic guidance Non-spinal ([id number]) Anesthesia: Local anesthesia (1-2% Lidocaine) Anxiolysis: IV                 Sedation:                         DOS: 01/31/2024  Performed by: Oswaldo Done, MD  Purpose: Therapeutic Indications: Shoulder pain severe enough to impact quality of life or function. Indications: No diagnosis found. Wanda Martinez has been dealing with the above chronic pain for longer than three months and has either failed to respond, was unable to tolerate, or simply did not get enough benefit from other more conservative therapies including, but not limited to: 1. Over-the-counter medications 2. Anti-inflammatory medications 3. Muscle relaxants 4. Membrane stabilizers 5. Opioids 6. Physical therapy and/or chiropractic manipulation 7. Modalities (Heat, ice, etc.) 8. Invasive techniques such as nerve blocks. Wanda Martinez has attained more than 50% relief of the pain from a series of diagnostic injections  conducted in separate occasions.  Pain Score: Pre-procedure:  /10 Post-procedure:  /10     Position / Prep / Materials:  Position: Prone  Prep solution: ChloraPrep (2% chlorhexidine gluconate and 70% isopropyl alcohol) Prep Area: Entire shoulder area.  This includes the lateral and posterior aspect of the neck, lateral and posterior aspect of the upper third of the upper arm, from the axilla to the spine in the upper back, down to the lower border of the scapula. Materials:  Tray:  RFA (Radiofrequency) tray Needle(s):  Type: RFA (Teflon-coated radiofrequency ablation needles)  Gauge (G): 22  Length: Regular (10cm) Qty:    ***     H&P (Pre-op Assessment):  Wanda Martinez is a 89 y.o. (year old), female patient, seen today for interventional treatment. She  has a past surgical history that includes Tonsillectomy and adenoidectomy (1979); Vesicovaginal fistula closure w/ TAH (1983); Breast surgery (1986); Breast enhancement surgery (1987); Breast implant removal; Breast implant removal (Right, 08/29/2012); Mastectomy (08/2012); Abdominal hysterectomy; Colonoscopy with propofol (N/A, 09/13/2016); Breast biopsy (2013); and Blepharoplasty. Wanda Martinez has a current medication list which includes the following prescription(s): amlodipine, aspirin ec, calcium carbonate, citalopram, citalopram, cyclobenzaprine, donepezil, gabapentin, glucosamine-chondroitin, hydrochlorothiazide, letrozole, losartan, lubiprostone, magnesium oxide, melatonin, metoprolol succinate, mirabegron er, multivitamin, naloxone, nitrofurantoin (macrocrystal-monohydrate), omega-3, oxycodone, pantoprazole, and rosuvastatin. Her primarily concern today is the No chief complaint on file.  Initial Vital Signs:  Pulse/HCG Rate:    Temp:   Resp:   BP:   SpO2:    BMI: Estimated body mass index is 28.87 kg/m as calculated from the following:   Height as of 01/29/24: 5\' 3"  (1.6 m).   Weight as of 01/30/24: 163 lb (73.9 kg).  Risk  Assessment: Allergies: Reviewed. She is allergic  to sulfa antibiotics and vesicare [solifenacin].  Allergy Precautions: None required Coagulopathies: Reviewed. None identified.  Blood-thinner therapy: None at this time Active Infection(s): Reviewed. None identified. Ms. Boulais is afebrile  Site Confirmation: Wanda Martinez was asked to confirm the procedure and laterality before marking the site Procedure checklist: Completed Consent: Before the procedure and under the influence of no sedative(s), amnesic(s), or anxiolytics, the patient was informed of the treatment options, risks and possible complications. To fulfill our ethical and legal obligations, as recommended by the American Medical Association's Code of Ethics, I have informed the patient of my clinical impression; the nature and purpose of the treatment or procedure; the risks, benefits, and possible complications of the intervention; the alternatives, including doing nothing; the risk(s) and benefit(s) of the alternative treatment(s) or procedure(s); and the risk(s) and benefit(s) of doing nothing. The patient was provided information about the general risks and possible complications associated with the procedure. These may include, but are not limited to: failure to achieve desired goals, infection, bleeding, organ or nerve damage, allergic reactions, paralysis, and death. In addition, the patient was informed of those risks and complications associated to the procedure, such as failure to decrease pain; infection; bleeding; organ or nerve damage with subsequent damage to sensory, motor, and/or autonomic systems, resulting in permanent pain, numbness, and/or weakness of one or several areas of the body; allergic reactions; (i.e.: anaphylactic reaction); and/or death. Furthermore, the patient was informed of those risks and complications associated with the medications. These include, but are not limited to: allergic reactions (i.e.:  anaphylactic or anaphylactoid reaction(s)); adrenal axis suppression; blood sugar elevation that in diabetics may result in ketoacidosis or comma; water retention that in patients with history of congestive heart failure may result in shortness of breath, pulmonary edema, and decompensation with resultant heart failure; weight gain; swelling or edema; medication-induced neural toxicity; particulate matter embolism and blood vessel occlusion with resultant organ, and/or nervous system infarction; and/or aseptic necrosis of one or more joints. Finally, the patient was informed that Medicine is not an exact science; therefore, there is also the possibility of unforeseen or unpredictable risks and/or possible complications that may result in a catastrophic outcome. The patient indicated having understood very clearly. We have given the patient no guarantees and we have made no promises. Enough time was given to the patient to ask questions, all of which were answered to the patient's satisfaction. Wanda Martinez has indicated that she wanted to continue with the procedure. Attestation: I, the ordering provider, attest that I have discussed with the patient the benefits, risks, side-effects, alternatives, likelihood of achieving goals, and potential problems during recovery for the procedure that I have provided informed consent. Date  Time: {CHL [facility]-PAIN TIME CHOICES:[id number]}  Pre-Procedure Preparation:  Monitoring: As per clinic protocol. Respiration, ETCO2, SpO2, BP, heart rate and rhythm monitor placed and checked for adequate function Safety Precautions: Patient was assessed for positional comfort and pressure points before starting the procedure. Time-out: I initiated and conducted the "Time-out" before starting the procedure, as per protocol. The patient was asked to participate by confirming the accuracy of the "Time Out" information. Verification of the correct person, site, and procedure were performed  and confirmed by me, the nursing staff, and the patient. "Time-out" conducted as per Joint Commission's Universal Protocol (UP.01.01.01). Time:   Start Time:   hrs.  Description/Narrative of Procedure:          Target: Suprascapular nerve as it passes thru the lower portion of the suprascapular  notch. Location: Suprascapular notch Region: Shoulder, suprascapular Approach: Percutaneous   Rationale (medical necessity): procedure needed and proper for the diagnosis and/or treatment of the patient's medical symptoms and needs. Procedural Technique Safety Precautions: Aspiration looking for blood return was conducted prior to all injections. At no point did we inject any substances, as a needle was being advanced. No attempts were made at seeking any paresthesias. Safe injection practices and needle disposal techniques used. Medications properly checked for expiration dates. SDV (single dose vial) medications used. Description of the Procedure: Protocol guidelines were followed. The patient was placed in position over the procedure table. The target area was identified and the area prepped in the usual manner. The skin and muscle were infiltrated with local anesthetic. Appropriate amount of time allowed to pass for local anesthetics to take effect. Radiofrequency needles were introduced to the target area using fluoroscopic guidance. Using the NeuroTherm NT1100 Radiofrequency Generator, sensory stimulation using 50 Hz was used to locate & identify the nerve, making sure that the needle was positioned such that there was no sensory stimulation below 0.3 V or above 0.7 V. Stimulation using 2 Hz was used to evaluate the motor component. Care was taken not to lesion any nerves that demonstrated motor stimulation of the lower extremities at an output of less than 2.5 times that of the sensory threshold, or a maximum of 2.0 V. Once satisfactory placement of the needles was achieved, the numbing solution was slowly  injected after negative aspiration. After waiting for at least 2 minutes, the ablation was performed at 80 degrees C for 60 seconds, using regular Radiofrequency settings. Once the procedure was completed, the needles were then removed and the area cleansed, making sure to leave some of the prepping solution back to take advantage of its long term bactericidal properties. Intra-operative Compliance: Compliant   There were no vitals filed for this visit.  Start Time:   hrs. End Time:   hrs.  Materials & Medications:  Needle(s) Type: Teflon-coated, curved tip, Radiofrequency needle(s) Gauge: 22G Length: 10cm Medication(s): Please see orders for medications and dosing details.  Imaging Guidance (Non-Spinal):          Type of Imaging Technique: Fluoroscopy Guidance (Non-Spinal) Indication(s): Fluoroscopy guidance for needle placement to enhance accuracy in procedures requiring precise needle localization for targeted delivery of medication in or near specific anatomical locations not easily accessible without such real-time imaging assistance. Exposure Time: Please see nurses notes. Contrast: Before injecting any contrast, we confirmed that the patient did not have an allergy to iodine, shellfish, or radiological contrast. Once satisfactory needle placement was completed at the desired level, radiological contrast was injected. Contrast injected under live fluoroscopy. No contrast complications. See chart for type and volume of contrast used. Fluoroscopic Guidance: I was personally present during the use of fluoroscopy. "Tunnel Vision Technique" used to obtain the best possible view of the target area. Parallax error corrected before commencing the procedure. "Direction-depth-direction" technique used to introduce the needle under continuous pulsed fluoroscopy. Once target was reached, antero-posterior, oblique, and lateral fluoroscopic projection used confirm needle placement in all planes. Images  permanently stored in EMR. Interpretation: I personally interpreted the imaging intraoperatively. Adequate needle placement confirmed in multiple planes. Appropriate spread of contrast into desired area was observed. No evidence of afferent or efferent intravascular uptake. Permanent images saved into the patient's record.  Antibiotic Prophylaxis:   Anti-infectives (From admission, onward)    None      Indication(s): None identified  Post-operative Assessment:  Post-procedure Vital Signs:  Pulse/HCG Rate:    Temp:   Resp:   BP:   SpO2:    EBL: None  Complications: No immediate post-treatment complications observed by team, or reported by patient.  Note: The patient tolerated the entire procedure well. A repeat set of vitals were taken after the procedure and the patient was kept under observation following institutional policy, for this type of procedure. Post-procedural neurological assessment was performed, showing return to baseline, prior to discharge. The patient was provided with post-procedure discharge instructions, including a section on how to identify potential problems. Should any problems arise concerning this procedure, the patient was given instructions to immediately contact us, at any time, without hesitation. In any case, we plan to contact the patient by telephone for a follow-up status report regarding this interventional procedure.  Comments:  No additional relevant information.  Plan of Care (POC)  Orders:  No orders of the defined types were placed in this encounter.  Chronic Opioid Analgesic:   Oxycodone IR 5 mg, 1 tab PO q 8 hrs (15 mg/day of oxycodone) MME/day: 22.5 mg/day.   Medications ordered for procedure: No orders of the defined types were placed in this encounter.  Medications administered: Wanda Martinez had no medications administered during this visit.  See the medical record for exact dosing, route, and time of  administration.  Follow-up plan:   No follow-ups on file.       Interventional Therapies  Risk Factors  Considerations  Medical Comorbidities:  Parkinson's disease  Hx right breast cancer  OIC  SOB  CKD Stage 3b  GERD  depression  osteopenia/osteoporosis  PSVT  Memory impairment      Planned  Pending:   Therapeutic right sided suprascapular nerve RFA #1    Under consideration:  Therapeutic right sided suprascapular nerve RFA #1  Possible intrathecal pump trial and permanent implant    Completed:   Diagnostic/therapeutic right suprascapular NB x2 (10/16/2023) (100/100/75 x 5 days/0) Therapeutic right trapezius TPI x4 (07/17/2023) (100/100/0/0)  Therapeutic right thoracic TPI x2 (10/17/2018) (DKFUA)  Therapeutic right QL & ESM TPI x1 (02/12/2018)   Diagnostic right SI joint Blk x3 (12/21/2020)  (DKFUA)  Therapeutic right SI joint RFA x4 (03/01/2023) (100/100/70/70)   Therapeutic right lumbar facet MBB (FCT: L3-4,L4-5,L5-S1) x6 (01/15/2024) (100/100/85/85)  Therapeutic right lumbar facet MBB (FCT: L4-5, L5-S1) x7 (11/01/2023) (100/100/70/R:70)  Therapeutic right lumbar facet MBB (FCT: L5-S1) x1 (08/29/2022) (100/100/100/0)  Therapeutic right lumbar facet RFA x4 (03/01/2023) (100/100/70/70)   Therapeutic bilateral L5 and L2 TFESI x1 (07/03/2023) (100/90/50/L:100R:20))  Therapeutic right L3 and L4 TFESI x1 (07/27/2022) (100/100/75/50)  Therapeutic right L5 TFESI x4 (02/14/2022) (100/100/70/75)  Therapeutic right S1 TFESI x1 (11/04/2020) (100/100/20/<50)   Therapeutic right L3-4 LESI x2 (11/04/2020) (100/100/20/<50)  Therapeutic right L4-5 LESI x2 (03/30/2022) (100/100/75/75)  Therapeutic right L5-S1 LESI x2 (12/04/2019) (100/100/50/75)  Therapeutic midline L2-3 LESI x1 (10/04/2017) (100/100/85/>50)   Diagnostic bilateral superficial trochanteric bursa injec. x1 (04/30/2018) (DKFUA)  Diagnostic right IA hip injection x1 (02/13/2019) (DKFUA)   Diagnostic/therapeutic  right IA steroid knee inj. x5 (07/31/2023) (100/100/100/80)  Therapeutic right (Zilretta) knee injection x3 (11/13/2023) (100/100/75/75) Therapeutic right (Monovisc) knee injection x1 (06/07/2021) (100/100/100/75)  Therapeutic right pes anserine bursa inj. x2 (12/25/2023) (100/100/50/50)  Therapeutic left thoracic TPI x1 (10/17/2018) (DKFUA)  Diagnostic left SI joint Blk x2 (04/12/2023) (100/90/20/<25)   Therapeutic left lumbar facet MBB (L2-S1) (FCT: L3-4,L4-5,L5-S1) x6 (01/15/2024) (100/100/85/85)  Therapeutic left lumbar facet MBB (FCT: L4-5, L5-S1) x7 (11/01/2023) (100/100/70/L: 85)  Therapeutic left lumbar facet MBB (L4-S1) (FCT: L5-S1) x1 (08/29/2022) (100/100/100/100)  Therapeutic left lumbar facet RFA x2 (11/30/2022) (100/60/90/>75)    Therapeutic  Palliative (PRN) options:   Do not schedule any procedures without a proper preprocedure evaluation.   Completed by other providers:   Therapeutic right knee (ORTHOVISC) inj. x3 (05/18/2023, 05/23/2023, 05/30/2023) by Aram Candela, MD [facility]) Therapeutic left knee (Kenalog) inj. x1 (08/25/2020) by Novella Olive, MD [facility])   Pharmacotherapy  Nonopioids transfer 12/21/2020: Gabapentin       Recent Visits Date Type Provider Dept  01/29/24 Office Visit [name], MD [facility]  01/15/24 Procedure visit [name], MD [facility]  12/25/23 Office Visit [name], MD [facility]  12/06/23 Procedure visit [name], MD [facility]  11/28/23 Office Visit [name], MD [facility]  11/13/23 Procedure visit [name], MD [facility]  11/12/23 Office Visit [name], MD [facility]  11/01/23 Procedure visit [name], MD [facility]  Showing recent visits within past 90 days and meeting all other requirements Future Appointments Date Type Provider Dept  01/31/24 Appointment  [name], MD [facility]  02/11/24 Appointment [name], MD [facility]  Showing future appointments within next 90 days and meeting all other requirements  Disposition: Discharge home  Discharge (Date  Time): 01/31/2024;   hrs.   Primary Care Physician: Dale Naplate, MD Location: [facility] Note by: Oswaldo Done, MD (TTS technology used. I apologize for any typographical errors that were not detected and corrected.) Date: 01/31/2024; Time: 9:33 AM  Disclaimer:  Medicine is not an Visual merchandiser. The only guarantee in medicine is that nothing is guaranteed. It is important to note that the decision to proceed with this intervention was based on the information collected from the patient. The Data and conclusions were drawn from the patient's questionnaire, the interview, and the physical examination. Because the information was provided in large part by the patient, it cannot be guaranteed that it has not been purposely or unconsciously manipulated. Every effort has been made to obtain as much relevant data as possible for this evaluation. It is important to note that the conclusions that lead to this procedure are derived in large part from the available data. Always take into account that the treatment will also be dependent on availability of resources and existing treatment guidelines, considered by other Pain Management Practitioners as being common knowledge and practice, at the time of the intervention. For Medico-Legal purposes, it is also important to point out that variation in procedural techniques and pharmacological choices are the acceptable norm. The indications, contraindications, technique, and results of the above procedure should only be interpreted and judged by a Board-Certified Interventional Pain Specialist with extensive familiarity and expertise in the same exact procedure and technique.

## 2024-01-30 NOTE — Progress Notes (Signed)
Called for: Number to call back later [facility]  [phone] [phone]  ID: Wanda Martinez Number OB: [birth year]  MR#: [id number]  AOZ#:[id number]  Patient Care Team: Dale [facility], MD as PCP - General (Internal Medicine) Debbe Odea, MD as PCP - Cardiology (Cardiology) [name], MD as Consulting Physician (Pain Medicine)  CHIEF COMPLAINT: History of breast cancer, anemia unspecified, osteoporosis.  INTERVAL HISTORY: Patient returns to clinic today for repeat laboratory, further evaluation, and continuation of Retacrit and Zometa.  She has increased burning with urination.  She also continues to have chronic weakness and fatigue as well as chronic pain.  She continues to have tremor.  She has no new neurologic complaints.  She denies any recent fevers or illnesses.  She has a good appetite and denies weight loss.  She denies any chest pain, shortness of breath, cough, or hemoptysis.  She denies any nausea, vomiting, constipation, or diarrhea.  Patient offers no further specific complaints today.    REVIEW OF SYSTEMS:   Review of Systems  Constitutional:  Positive for malaise/fatigue. Negative for fever and weight loss.  Respiratory: Negative.  Negative for cough and shortness of breath.   Cardiovascular: Negative.  Negative for chest pain and leg swelling.  Gastrointestinal: Negative.  Negative for abdominal pain.  Genitourinary:  Positive for dysuria.  Musculoskeletal:  Positive for back pain. Negative for joint pain.  Skin: Negative.  Negative for rash.  Neurological:  Positive for tremors and weakness. Negative for sensory change, speech change and focal weakness.  Psychiatric/Behavioral: Negative.  Negative for depression. The patient is not nervous/anxious.     As per HPI. Otherwise, a complete review of systems is negative.  PAST MEDICAL HISTORY: Past Medical History:  Diagnosis Date   Acute postoperative pain 10/25/2017    Anemia    Arm pain 07/26/2015   Arthritis    Arthritis, degenerative 03/26/2014   Back pain 11/01/2013   Bladder infection 06/2018   Breast cancer (HCC)    Masectomy - left - 1986    Breast cancer [facility])    Mastectomy-right -2014   CHEST PAIN 04/29/2010   Qualifier: Diagnosis of  By: Laural Benes RN, Erika     Chronic cystitis    Cystocele 02/22/2013   Cystocele, midline 08/19/2013   Degeneration of intervertebral disc of lumbosacral region 03/26/2014   DYSPNEA 04/29/2010   Qualifier: Diagnosis of  By: Laural Benes RN, Erika     Enthesopathy of hip 03/26/2014   GERD (gastroesophageal reflux disease)    Hiatal hernia    HTN (hypertension)    Hypokalemia 06/2018   Hyponatremia 06/2018   LBP (low back pain) 03/26/2014   Neck pain 11/01/2013   Parkinson disease (HCC)    Pneumonia 06/2018   Sinusitis 02/07/2015   Skin lesions 07/12/2014   Urinary incontinence    mixed     PAST SURGICAL HISTORY: Past Surgical History:  Procedure Laterality Date   ABDOMINAL HYSTERECTOMY     BLEPHAROPLASTY     BREAST BIOPSY  2013   BREAST ENHANCEMENT SURGERY  1987   BREAST IMPLANT REMOVAL     BREAST IMPLANT REMOVAL Right 08/29/2012   BREAST SURGERY  1986   s/p left mastectomy   COLONOSCOPY WITH PROPOFOL N/A 09/13/2016   Procedure: COLONOSCOPY WITH PROPOFOL;  Surgeon: Scot Jun, MD;  Location: [facility] ENDOSCOPY;  Service: Endoscopy;  Laterality: N/A;   MASTECTOMY  08/2012   right   TONSILLECTOMY AND ADENOIDECTOMY  1979   VESICOVAGINAL FISTULA CLOSURE W/ TAH  1983    FAMILY HISTORY Family History  Problem Relation Age of Onset   Diabetes Father    Stroke Father    Colon polyps Father    Stroke Mother    Parkinson's disease Mother        ADVANCED DIRECTIVES:    HEALTH MAINTENANCE: Social History   Tobacco Use   Smoking status: Never    Passive exposure: Never   Smokeless tobacco: Never   Tobacco comments:    tobacco use - no  Vaping Use   Vaping status: Never Used  Substance Use Topics    Alcohol use: No    Alcohol/week: 0.0 standard drinks of alcohol   Drug use: No     Allergies  Allergen Reactions   Sulfa Antibiotics Nausea Only   Vesicare [Solifenacin] Other (See Comments)    Constipation Constipation    Current Outpatient Medications  Medication Sig Dispense Refill   amLODipine (NORVASC) 2.5 MG tablet Take 1 tablet (2.5 mg total) by mouth daily. 30 tablet 0   aspirin 81 MG EC tablet Take 81 mg by mouth daily as needed.     calcium carbonate (OSCAL) 1500 (600 Ca) MG TABS tablet Take 1 tablet by mouth daily.     citalopram (CELEXA) 10 MG tablet Take 10 mg by mouth daily.     citalopram (CELEXA) 20 MG tablet TAKE 1 TABLET BY MOUTH ONCE DAILY. 30 tablet 11   cyclobenzaprine (FLEXERIL) 10 MG tablet TAKE ONE TABLET BY MOUTH AT BEDTIME. 30 tablet 11   donepezil (ARICEPT) 10 MG tablet TAKE ONE TABLET BY MOUTH AT BEDTIME 30 tablet 11   gabapentin (NEURONTIN) 300 MG capsule Take 1 capsule (300 mg total) by mouth 2 (two) times daily. 180 capsule 1   Glucosamine-Chondroitin 500-400 MG CAPS Take 2 capsules by mouth daily.     hydrochlorothiazide (HYDRODIURIL) 12.5 MG tablet Take 12.5 mg by mouth every morning.     letrozole (FEMARA) 2.5 MG tablet Take 2.5 mg by mouth daily.     losartan (COZAAR) 50 MG tablet TAKE (2) TABLETS BY MOUTH ONCE DAILY. 60 tablet 11   lubiprostone (AMITIZA) 24 MCG capsule TAKE 1 CAPSULE BY MOUTH ONCE DAILY WITH BREAKFAST.     magnesium oxide (MAG-OX) 400 (240 Mg) MG tablet TAKE ONE TABLET BY MOUTH EVERY DAY 30 tablet 11   melatonin 1 MG TABS tablet Take 3 mg by mouth at bedtime.     metoprolol succinate (TOPROL-XL) 25 MG 24 hr tablet Take 1 tablet (25 mg total) by mouth 2 (two) times daily. 180 tablet 1   mirabegron ER (MYRBETRIQ) 50 MG TB24 tablet Take 1 tablet (50 mg total) by mouth daily. 30 tablet 11   Multiple Vitamin (MULTIVITAMIN) tablet Take 1 tablet by mouth daily.     naloxone (NARCAN) nasal spray 4 mg/0.1 mL Place 1 spray into the nose as  needed for up to 365 doses (for opioid-induced respiratory depresssion). In case of emergency (overdose), spray once into each nostril. If no response within 3 minutes, repeat application and call 911. 1 each 0   nitrofurantoin, macrocrystal-monohydrate, (MACROBID) 100 MG capsule Take 100 mg by mouth 2 (two) times daily.     Omega-3 1000 MG CAPS Take by mouth.     oxyCODONE (OXY IR/ROXICODONE) 5 MG immediate release tablet Take 1 tablet (5 mg total) by mouth every 6 (six) hours as needed for severe pain (pain score 7-10) or breakthrough pain (PRN for post-radiofrequency pain). Must last 30 days. 120 tablet  0   pantoprazole (PROTONIX) 40 MG tablet TAKE ONE TABLET BY MOUTH EVERY DAY 30 tablet 11   rosuvastatin (CRESTOR) 20 MG tablet TAKE ONE TABLET BY MOUTH AT BEDTIME 30 tablet 11   No current facility-administered medications for this visit.   Facility-Administered Medications Ordered in Other Visits  Medication Dose Route Frequency Provider Last Rate Last Admin   sodium chloride flush (NS) 0.9 % injection 10 mL  10 mL Intravenous Once Jeralyn Ruths, MD       Zoledronic Acid (ZOMETA) IVPB 4 mg  4 mg Intravenous Once Jeralyn Ruths, MD        OBJECTIVE: Vitals:   01/30/24 0915  BP: 134/79  Pulse: (!) 56  Temp: (!) 97.1 F (36.2 C)  SpO2: 100%     Body mass index is 28.87 kg/m.    ECOG FS:2 - Symptomatic, <50% confined to bed  General: Well-developed, well-nourished, no acute distress.  Sitting in a wheelchair. Eyes: Pink conjunctiva, anicteric sclera. HEENT: Normocephalic, moist mucous membranes. Lungs: No audible wheezing or coughing. Heart: Regular rate and rhythm. Abdomen: Soft, nontender, no obvious distention. Musculoskeletal: No edema, cyanosis, or clubbing. Neuro: Alert, answering all questions appropriately. Cranial nerves grossly intact.  Resting tremor noted. Skin: No rashes or petechiae noted. Psych: Normal affect.  LAB RESULTS:  Lab Results  Component  Value Date   NA 134 (L) 01/30/2024   K 3.7 01/30/2024   CL 103 01/30/2024   CO2 25 01/30/2024   GLUCOSE 89 01/30/2024   BUN 27 (H) 01/30/2024   CREATININE 0.84 01/30/2024   CALCIUM 9.1 01/30/2024   PROT 7.3 09/11/2023   ALBUMIN 3.8 09/11/2023   AST 25 09/11/2023   ALT 19 09/11/2023   ALKPHOS 32 (L) 09/11/2023   BILITOT 0.4 09/11/2023   GFRNONAA >60 01/30/2024   GFRAA >60 07/27/2020    Lab Results  Component Value Date   WBC 6.0 01/30/2024   NEUTROABS 4.2 01/30/2024   HGB 11.3 (L) 01/30/2024   HCT 34.8 (L) 01/30/2024   MCV 91.8 01/30/2024   PLT 176 01/30/2024     STUDIES: DG PAIN CLINIC C-ARM 1-60 MIN NO REPORT Result Date: 01/15/2024 Fluoro was used, but no Radiologist interpretation will be provided. Please refer to "NOTES" tab for provider progress note.   ASSESSMENT: History of breast cancer, anemia unspecified, osteoporosis.  PLAN:    Anemia: Patient's hemoglobin has improved and is 11.3 today.  Previously, her iron stores all of her other laboratory work including iron stores were either negative or within normal limits.  She does not require additional Retacrit today.  Patient last received treatment on August 15, 2023.  Return to clinic in 3 months for laboratory work and consideration of Retacrit.  Patient will then return to clinic in 6 months for laboratory work, further evaluation, and continuation of treatment.   Osteoporosis: Patient's most recent bone mineral density on July 09, 2023 was reported -2.7 which is essentially unchanged from previous.  Patient previously received Prolia, but this has been switched to Zometa secondary to insurance purposes.  Zometa is dose reduced secondary to her renal insufficiency.  Continue calcium and vitamin D supplementation.  Proceed with dose reduced Zometa today.  Return to clinic in 6 months for repeat laboratory, further evaluation, and continuation of treatment.  Will repeat bone mineral density prior to next clinic visit.    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  She completed 5 years of letrozole in May 2019.  No further interventions are needed.  Patient has had bilateral mastectomies, therefore no further mammograms are necessary.  Back/joint pain: Chronic and unchanged.  Continue symptomatic treatment with pain clinic.   Parkinson's: Chronic and unchanged.  Continue follow-up with neurology. Dysuria: Patient was unable to provide urine sample at time of dictation.   Patient expressed understanding and was in agreement with this plan. She also understands that She can call clinic at any time with any questions, concerns, or complaints.   Breast cancer [facility])   Staging form: Breast, AJCC 7th Edition     Clinical stage from 09/22/2015: Stage IIA (T1c, N1, M0) - Signed by Jeralyn Ruths, MD on 09/22/2015   Jeralyn Ruths, MD   01/30/2024 9:56 AM

## 2024-01-30 NOTE — Patient Instructions (Signed)

## 2024-01-30 NOTE — Patient Instructions (Signed)
___________________________________________________________________________________________  Post-Radiofrequency (RF) Discharge Instructions  You have just completed a Radiofrequency Neurotomy.  The following instructions will provide you with information and guidelines for self-care upon discharge.  If at any time you have questions or concerns please call your physician. DO NOT DRIVE YOURSELF!!  Instructions:  Apply ice: Fill a plastic sandwich bag with crushed ice. Cover it with a small towel and apply to injection site. Apply for 15 minutes then remove x 15 minutes. Repeat sequence on day of procedure, until you go to bed. The purpose is to minimize swelling and discomfort after procedure.  Apply heat: Apply heat to procedure site starting the day following the procedure. The purpose is to treat any soreness and discomfort from the procedure.  Food intake: No eating limitations, unless stipulated above.  Nevertheless, if you have had sedation, you may experience some nausea.  In this case, it may be wise to wait at least two hours prior to resuming regular diet.  Physical activities: Keep activities to a minimum for the first 8 hours after the procedure. For the first 24 hours after the procedure, do not drive a motor vehicle,  Operate heavy machinery, power tools, or handle any weapons.  Consider walking with the use of an assistive device or accompanied by an adult for the first 24 hours.  Do not drink alcoholic beverages including beer.  Do not make any important decisions or sign any legal documents. Go home and rest today.  Resume activities tomorrow, as tolerated.  Use caution in moving about as you may experience mild leg weakness.  Use caution in cooking, use of household electrical appliances and climbing steps.  Driving: If you have received any sedation, you are not allowed to drive for 24 hours after your procedure.  Blood thinner: Restart your blood thinner 6 hours after your  procedure. (Only for those taking blood thinners)  Insulin: As soon as you can eat, you may resume your normal dosing schedule. (Only for those taking insulin)  Medications: May resume pre-procedure medications.  Do not take any drugs, other than what has been prescribed to you.  Infection prevention: Keep procedure site clean and dry.  Post-procedure Pain Diary: Extremely important that this be done correctly and accurately. Recorded information will be used to determine the next step in treatment.  Pain evaluated is that of treated area only. Do not include pain from an untreated area.  Complete every hour, on the hour, for the initial 8 hours. Set an alarm to help you do this part accurately.  Do not go to sleep and have it completed later. It will not be accurate.  Follow-up appointment: Keep your follow-up appointment after the procedure. Usually 2-6 weeks after radiofrequency. Bring you pain diary. The information collected will be essential for your long-term care.   Expect:  From numbing medicine (AKA: Local Anesthetics): Numbness or decrease in pain.  Onset: Full effect within 15 minutes of injected.  Duration: It will depend on the type of local anesthetic used. On the average, 1 to 8 hours.   From steroids (when added): Decrease in swelling or inflammation. Once inflammation is improved, relief of the pain will follow.  Onset of benefits: Depends on the amount of swelling present. The more swelling, the longer it will take for the benefits to be seen. In some cases, up to 10 days.  Duration: Steroids will stay in the system x 2 weeks. Duration of benefits will depend on multiple posibilities including persistent irritating factors.  From procedure: Some discomfort is to be expected once the numbing medicine wears off. In the case of radiofrequency procedures, this may last as long as 6 weeks. Additional post-procedure pain medication is provided for this. Discomfort is  minimized if ice and heat are applied as instructed.  Call if:  You experience numbness and weakness that gets worse with time, as opposed to wearing off.  He experience any unusual bleeding, difficulty breathing, or loss of the ability to control your bowel and bladder. (This applies to Spinal procedures only)  You experience any redness, swelling, heat, red streaks, elevated temperature, fever, or any other signs of a possible infection.  Emergency Numbers:  Durning business hours (Monday - Thursday, 8:00 AM - 4:00 PM) (Friday, 9:00 AM - 12:00 Noon): [phone]  After hours: [phone] ____________________________________________________________________________________________

## 2024-01-30 NOTE — Telephone Encounter (Signed)
LM to schedule patient. Ok to use same day spot sooner than April since this is a pre-op appt.

## 2024-01-31 ENCOUNTER — Ambulatory Visit (HOSPITAL_BASED_OUTPATIENT_CLINIC_OR_DEPARTMENT_OTHER): Payer: Medicare Other | Admitting: Pain Medicine

## 2024-01-31 DIAGNOSIS — Z5189 Encounter for other specified aftercare: Secondary | ICD-10-CM

## 2024-01-31 DIAGNOSIS — Z91199 Patient's noncompliance with other medical treatment and regimen due to unspecified reason: Secondary | ICD-10-CM

## 2024-01-31 LAB — URINE CULTURE: Culture: NO GROWTH

## 2024-01-31 NOTE — Telephone Encounter (Signed)
Patient scheduled for pre-op appointment

## 2024-02-04 ENCOUNTER — Other Ambulatory Visit: Payer: Self-pay | Admitting: Pain Medicine

## 2024-02-04 ENCOUNTER — Telehealth: Payer: Self-pay | Admitting: Pain Medicine

## 2024-02-04 DIAGNOSIS — G8929 Other chronic pain: Secondary | ICD-10-CM

## 2024-02-04 DIAGNOSIS — M47817 Spondylosis without myelopathy or radiculopathy, lumbosacral region: Secondary | ICD-10-CM

## 2024-02-04 DIAGNOSIS — M545 Low back pain, unspecified: Secondary | ICD-10-CM

## 2024-02-04 DIAGNOSIS — M47816 Spondylosis without myelopathy or radiculopathy, lumbar region: Secondary | ICD-10-CM

## 2024-02-04 DIAGNOSIS — M5459 Other low back pain: Secondary | ICD-10-CM

## 2024-02-04 NOTE — Telephone Encounter (Signed)
Asked Dr. Laban Emperor, he said he would order facet block.

## 2024-02-04 NOTE — Progress Notes (Signed)
(  02/04/2024) Wanda Martinez called indicating that she is over the episode of weakness that she experiences and that prevented her from having her shoulder injection on 01/31/2024.  However, she has also indicated that she is having a flareup of her low back pain and would rather have the lower back injections done first.  Today I have entered an order for a palliative bilateral lumbar facet block, as requested by the patient.

## 2024-02-04 NOTE — Telephone Encounter (Signed)
Patient says the left side of her lower back is hurting more than her shoulder can she get injections for that.

## 2024-02-05 ENCOUNTER — Ambulatory Visit: Payer: Medicare Other | Attending: Pain Medicine | Admitting: Pain Medicine

## 2024-02-05 ENCOUNTER — Encounter: Payer: Self-pay | Admitting: Pain Medicine

## 2024-02-05 ENCOUNTER — Ambulatory Visit
Admission: RE | Admit: 2024-02-05 | Discharge: 2024-02-05 | Disposition: A | Discharge status: Home / Self Care | Payer: Medicare Other | Source: Ambulatory Visit | Attending: Pain Medicine | Admitting: Pain Medicine

## 2024-02-05 VITALS — BP 138/64 | HR 54 | Temp 98.1°F | Resp 16 | Ht 63.0 in | Wt 162.0 lb

## 2024-02-05 DIAGNOSIS — M47816 Spondylosis without myelopathy or radiculopathy, lumbar region: Secondary | ICD-10-CM

## 2024-02-05 DIAGNOSIS — M47817 Spondylosis without myelopathy or radiculopathy, lumbosacral region: Secondary | ICD-10-CM | POA: Diagnosis not present

## 2024-02-05 DIAGNOSIS — M5137 Other intervertebral disc degeneration, lumbosacral region with discogenic back pain only: Secondary | ICD-10-CM | POA: Diagnosis not present

## 2024-02-05 DIAGNOSIS — G20B1 Parkinson's disease with dyskinesia, without mention of fluctuations: Secondary | ICD-10-CM | POA: Diagnosis not present

## 2024-02-05 DIAGNOSIS — M545 Low back pain, unspecified: Secondary | ICD-10-CM | POA: Diagnosis not present

## 2024-02-05 DIAGNOSIS — G8929 Other chronic pain: Secondary | ICD-10-CM

## 2024-02-05 DIAGNOSIS — M5459 Other low back pain: Secondary | ICD-10-CM

## 2024-02-05 MED ORDER — MIDAZOLAM HCL 2 MG/2ML IJ SOLN
0.5000 mg | Freq: Once | INTRAMUSCULAR | Status: AC
  Administered 2024-02-05: 1.5 mg via INTRAVENOUS
  Filled 2024-02-05: qty 2

## 2024-02-05 MED ORDER — LIDOCAINE HCL 2 % IJ SOLN
20.0000 mL | Freq: Once | INTRAMUSCULAR | Status: AC
Start: 2024-02-05 — End: 2024-02-05
  Administered 2024-02-05: 400 mg
  Filled 2024-02-05: qty 20

## 2024-02-05 MED ORDER — PENTAFLUOROPROP-TETRAFLUOROETH EX AERO
INHALATION_SPRAY | Freq: Once | CUTANEOUS | Status: AC
Start: 2024-02-05 — End: 2024-02-05
  Administered 2024-02-05: 30 via TOPICAL

## 2024-02-05 MED ORDER — TRIAMCINOLONE ACETONIDE 40 MG/ML IJ SUSP
80.0000 mg | Freq: Once | INTRAMUSCULAR | Status: AC
  Administered 2024-02-05: 40 mg
  Filled 2024-02-05: qty 2

## 2024-02-05 MED ORDER — ROPIVACAINE HCL 2 MG/ML IJ SOLN
18.0000 mL | Freq: Once | INTRAMUSCULAR | Status: AC
  Administered 2024-02-05: 20 mL via PERINEURAL
  Filled 2024-02-05: qty 20

## 2024-02-05 NOTE — Progress Notes (Signed)
PROVIDER NOTE: Interpretation of information contained herein should be left to medically-trained personnel. Specific patient instructions are provided elsewhere under "Patient Instructions" section of medical record. This document was created in part using STT-dictation technology, any transcriptional errors that may result from this process are unintentional.  Patient: Wanda Martinez Type: Established DOB: [birth year] MRN: [id number] PCP: Dale Box [place], MD  Service: Procedure DOS: 02/05/2024 Setting: Ambulatory Location: Ambulatory outpatient facility Delivery: Face-to-face Provider: Oswaldo Done, MD Specialty: Interventional Pain Management Specialty designation: 09 Location: Outpatient facility Ref. Prov.: Dale Dorado, MD       Interventional Therapy   Type: Lumbar Facet, Medial Branch Block(s)    #7   Laterality: Bilateral  Level: L2, L3, L4, L5, and S1 Medial Branch Level(s). Injecting these levels blocks the L3-4, L4-5, and L5-S1 lumbar facet joints. Imaging: Fluoroscopic guidance Spinal ([id number]) Anesthesia: Local anesthesia (1-2% Lidocaine) Anxiolysis: IV Versed 1.5 mg Sedation: No Sedation                       DOS: 02/05/2024 Performed by: Oswaldo Done, MD  Primary Purpose: Diagnostic/Therapeutic Indications: Low back pain severe enough to impact quality of life or function. 1. Chronic low back pain (1ry area of Pain) (Bilateral) (R>L) w/o sciatica   2. Lumbar facet joint pain   3. Lumbar facet hypertrophy (Multilevel) (Bilateral)   4. Lumbar facet syndrome (Bilateral) (R>L)   5. Spondylosis without myelopathy or radiculopathy, lumbosacral region   6. Degeneration of intervertebral disc of lumbosacral region with discogenic back pain    NAS-11 Pain score:   Pre-procedure: 9 /10   Post-procedure: 9 /10     Position / Prep / Materials:  Position: Prone  Prep solution: ChloraPrep (2% chlorhexidine gluconate and 70% isopropyl alcohol) Area  Prepped: Posterolateral Lumbosacral Spine (Wide prep: From the lower border of the scapula down to the end of the tailbone and from flank to flank.)  Materials:  Tray: Block Needle(s):  Type: Spinal  Gauge (G): 22  Length: 3.5-in Qty: 4      H&P (Pre-op Assessment):  Wanda Martinez is a 89 y.o. (year old), female patient, seen today for interventional treatment. She  has a past surgical history that includes Tonsillectomy and adenoidectomy (1979); Vesicovaginal fistula closure w/ TAH (1983); Breast surgery (1986); Breast enhancement surgery (1987); Breast implant removal; Breast implant removal (Right, 08/29/2012); Mastectomy (08/2012); Abdominal hysterectomy; Colonoscopy with propofol (N/A, 09/13/2016); Breast biopsy (2013); and Blepharoplasty. Wanda Martinez has a current medication list which includes the following prescription(s): amlodipine, aspirin ec, calcium carbonate, citalopram, citalopram, cyclobenzaprine, donepezil, gabapentin, glucosamine-chondroitin, hydrochlorothiazide, letrozole, losartan, lubiprostone, magnesium oxide, melatonin, metoprolol succinate, mirabegron er, multivitamin, naloxone, nitrofurantoin (macrocrystal-monohydrate), omega-3, oxycodone, pantoprazole, and rosuvastatin. Her primarily concern today is the Back Pain (lower)  Initial Vital Signs:  Pulse/HCG Rate: (!) 54ECG Heart Rate: (!) 58 Temp: 98.1 F (36.7 C) Resp: 14 BP: 93/74 SpO2: 100 %  BMI: Estimated body mass index is 28.7 kg/m as calculated from the following:   Height as of this encounter: 5\' 3"  (1.6 m).   Weight as of this encounter: 162 lb (73.5 kg).  Risk Assessment: Allergies: Reviewed. She is allergic to sulfa antibiotics and vesicare [solifenacin].  Allergy Precautions: None required Coagulopathies: Reviewed. None identified.  Blood-thinner therapy: None at this time Active Infection(s): Reviewed. None identified. Wanda Martinez is afebrile  Site Confirmation: Wanda Martinez was asked to confirm the  procedure and laterality before marking the site Procedure checklist: Completed Consent: Before the procedure and  under the influence of no sedative(s), amnesic(s), or anxiolytics, the patient was informed of the treatment options, risks and possible complications. To fulfill our ethical and legal obligations, as recommended by the American Medical Association's Code of Ethics, I have informed the patient of my clinical impression; the nature and purpose of the treatment or procedure; the risks, benefits, and possible complications of the intervention; the alternatives, including doing nothing; the risk(s) and benefit(s) of the alternative treatment(s) or procedure(s); and the risk(s) and benefit(s) of doing nothing. The patient was provided information about the general risks and possible complications associated with the procedure. These may include, but are not limited to: failure to achieve desired goals, infection, bleeding, organ or nerve damage, allergic reactions, paralysis, and death. In addition, the patient was informed of those risks and complications associated to Spine-related procedures, such as failure to decrease pain; infection (i.e.: Meningitis, epidural or intraspinal abscess); bleeding (i.e.: epidural hematoma, subarachnoid hemorrhage, or any other type of intraspinal or peri-dural bleeding); organ or nerve damage (i.e.: Any type of peripheral nerve, nerve root, or spinal cord injury) with subsequent damage to sensory, motor, and/or autonomic systems, resulting in permanent pain, numbness, and/or weakness of one or several areas of the body; allergic reactions; (i.e.: anaphylactic reaction); and/or death. Furthermore, the patient was informed of those risks and complications associated with the medications. These include, but are not limited to: allergic reactions (i.e.: anaphylactic or anaphylactoid reaction(s)); adrenal axis suppression; blood sugar elevation that in diabetics may result  in ketoacidosis or comma; water retention that in patients with history of congestive heart failure may result in shortness of breath, pulmonary edema, and decompensation with resultant heart failure; weight gain; swelling or edema; medication-induced neural toxicity; particulate matter embolism and blood vessel occlusion with resultant organ, and/or nervous system infarction; and/or aseptic necrosis of one or more joints. Finally, the patient was informed that Medicine is not an exact science; therefore, there is also the possibility of unforeseen or unpredictable risks and/or possible complications that may result in a catastrophic outcome. The patient indicated having understood very clearly. We have given the patient no guarantees and we have made no promises. Enough time was given to the patient to ask questions, all of which were answered to the patient's satisfaction. Wanda Martinez has indicated that she wanted to continue with the procedure. Attestation: I, the ordering provider, attest that I have discussed with the patient the benefits, risks, side-effects, alternatives, likelihood of achieving goals, and potential problems during recovery for the procedure that I have provided informed consent. Date  Time: 02/05/2024 11:43 AM  Pre-Procedure Preparation:  Monitoring: As per clinic protocol. Respiration, ETCO2, SpO2, BP, heart rate and rhythm monitor placed and checked for adequate function Safety Precautions: Patient was assessed for positional comfort and pressure points before starting the procedure. Time-out: I initiated and conducted the "Time-out" before starting the procedure, as per protocol. The patient was asked to participate by confirming the accuracy of the "Time Out" information. Verification of the correct person, site, and procedure were performed and confirmed by me, the nursing staff, and the patient. "Time-out" conducted as per Joint Commission's Universal Protocol  (UP.01.01.01). Time: 1249 Start Time: 1250 hrs.  Description of Procedure:          Laterality: (see above) Targeted Levels: (see above)  Safety Precautions: Aspiration looking for blood return was conducted prior to all injections. At no point did we inject any substances, as a needle was being advanced. Before injecting, the patient was  told to immediately notify me if she was experiencing any new onset of "ringing in the ears, or metallic taste in the mouth". No attempts were made at seeking any paresthesias. Safe injection practices and needle disposal techniques used. Medications properly checked for expiration dates. SDV (single dose vial) medications used. After the completion of the procedure, all disposable equipment used was discarded in the proper designated medical waste containers. Local Anesthesia: Protocol guidelines were followed. The patient was positioned over the fluoroscopy table. The area was prepped in the usual manner. The time-out was completed. The target area was identified using fluoroscopy. A 12-in long, straight, sterile hemostat was used with fluoroscopic guidance to locate the targets for each level blocked. Once located, the skin was marked with an approved surgical skin marker. Once all sites were marked, the skin (epidermis, dermis, and hypodermis), as well as deeper tissues (fat, connective tissue and muscle) were infiltrated with a small amount of a short-acting local anesthetic, loaded on a 10cc syringe with a 25G, 1.5-in  Needle. An appropriate amount of time was allowed for local anesthetics to take effect before proceeding to the next step. Local Anesthetic: Lidocaine 2.0% The unused portion of the local anesthetic was discarded in the proper designated containers. Technical description of process:  L2 Medial Branch Nerve Block (MBB): The target area for the L2 medial branch is at the junction of the postero-lateral aspect of the superior articular process and the  superior, posterior, and medial edge of the transverse process of L3. Under fluoroscopic guidance, a Quincke needle was inserted until contact was made with os over the superior postero-lateral aspect of the pedicular shadow (target area). After negative aspiration for blood, 0.5 mL of the nerve block solution was injected without difficulty or complication. The needle was removed intact. L3 Medial Branch Nerve Block (MBB): The target area for the L3 medial branch is at the junction of the postero-lateral aspect of the superior articular process and the superior, posterior, and medial edge of the transverse process of L4. Under fluoroscopic guidance, a Quincke needle was inserted until contact was made with os over the superior postero-lateral aspect of the pedicular shadow (target area). After negative aspiration for blood, 0.5 mL of the nerve block solution was injected without difficulty or complication. The needle was removed intact. L4 Medial Branch Nerve Block (MBB): The target area for the L4 medial branch is at the junction of the postero-lateral aspect of the superior articular process and the superior, posterior, and medial edge of the transverse process of L5. Under fluoroscopic guidance, a Quincke needle was inserted until contact was made with os over the superior postero-lateral aspect of the pedicular shadow (target area). After negative aspiration for blood, 0.5 mL of the nerve block solution was injected without difficulty or complication. The needle was removed intact. L5 Medial Branch Nerve Block (MBB): The target area for the L5 medial branch is at the junction of the postero-lateral aspect of the superior articular process and the superior, posterior, and medial edge of the sacral ala. Under fluoroscopic guidance, a Quincke needle was inserted until contact was made with os over the superior postero-lateral aspect of the pedicular shadow (target area). After negative aspiration for blood, 0.5  mL of the nerve block solution was injected without difficulty or complication. The needle was removed intact. S1 Medial Branch Nerve Block (MBB): The target area for the S1 medial branch is at the posterior and inferior 6 o'clock position of the L5-S1 facet joint. Under  fluoroscopic guidance, the Quincke needle inserted for the L5 MBB was redirected until contact was made with os over the inferior and postero aspect of the sacrum, at the 6 o' clock position under the L5-S1 facet joint (Target area). After negative aspiration for blood, 0.5 mL of the nerve block solution was injected without difficulty or complication. The needle was removed intact.  Once the entire procedure was completed, the treated area was cleaned, making sure to leave some of the prepping solution back to take advantage of its long term bactericidal properties.         Illustration of the posterior view of the lumbar spine and the posterior neural structures. Laminae of L2 through S1 are labeled. DPRL5, dorsal primary ramus of L5; DPRS1, dorsal primary ramus of S1; DPR3, dorsal primary ramus of L3; FJ, facet (zygapophyseal) joint L3-L4; I, inferior articular process of L4; LB1, lateral branch of dorsal primary ramus of L1; IAB, inferior articular branches from L3 medial branch (supplies L4-L5 facet joint); IBP, intermediate branch plexus; MB3, medial branch of dorsal primary ramus of L3; NR3, third lumbar nerve root; S, superior articular process of L5; SAB, superior articular branches from L4 (supplies L4-5 facet joint also); TP3, transverse process of L3.   Facet Joint Innervation (* possible contribution)  L1-2 T12, L1 (L2*)  Medial Branch  L2-3 L1, L2 (L3*)         "          "  L3-4 L2, L3 (L4*)         "          "  L4-5 L3, L4 (L5*)         "          "  L5-S1 L4, L5, S1          "          "    Vitals:   02/05/24 1142 02/05/24 1253 02/05/24 1258 02/05/24 1304  BP: 93/74 (!) 166/93 (!) 153/89 138/64  Pulse: (!) 54      Resp: 14 15 17 16   Temp: 98.1 F (36.7 C)     TempSrc: Temporal     SpO2: 100% 100% 100% 100%  Weight: 162 lb (73.5 kg)     Height: 5\' 3"  (1.6 m)        End Time: 1257 hrs.  Imaging Guidance (Spinal):          Type of Imaging Technique: Fluoroscopy Guidance (Spinal) Indication(s): Fluoroscopy guidance for needle placement to enhance accuracy in procedures requiring precise needle localization for targeted delivery of medication in or near specific anatomical locations not easily accessible without such real-time imaging assistance. Exposure Time: Please see nurses notes. Contrast: None used. Fluoroscopic Guidance: I was personally present during the use of fluoroscopy. "Tunnel Vision Technique" used to obtain the best possible view of the target area. Parallax error corrected before commencing the procedure. "Direction-depth-direction" technique used to introduce the needle under continuous pulsed fluoroscopy. Once target was reached, antero-posterior, oblique, and lateral fluoroscopic projection used confirm needle placement in all planes. Images permanently stored in EMR. Interpretation: No contrast injected. I personally interpreted the imaging intraoperatively. Adequate needle placement confirmed in multiple planes. Permanent images saved into the patient's record.  Post-operative Assessment:  Post-procedure Vital Signs:  Pulse/HCG Rate: (!) 54(!) 55 Temp: 98.1 F (36.7 C) Resp: 16 BP: 138/64 SpO2: 100 %  EBL: None  Complications: No immediate post-treatment complications observed by team, or reported  by patient.  Note: The patient tolerated the entire procedure well. A repeat set of vitals were taken after the procedure and the patient was kept under observation following institutional policy, for this type of procedure. Post-procedural neurological assessment was performed, showing return to baseline, prior to discharge. The patient was provided with post-procedure  discharge instructions, including a section on how to identify potential problems. Should any problems arise concerning this procedure, the patient was given instructions to immediately contact us, at any time, without hesitation. In any case, we plan to contact the patient by telephone for a follow-up status report regarding this interventional procedure.  Comments:  No additional relevant information.  Plan of Care (POC)  Orders:  Orders Placed This Encounter  Procedures   LUMBAR FACET(MEDIAL BRANCH NERVE BLOCK) MBNB    Scheduling Instructions:     Procedure: Lumbar facet block (AKA.: Lumbosacral medial branch nerve block)     Side: Bilateral     Level: L3-4, L4-5, L5-S1, and TBD Facets (L2, L3, L4, L5, S1, and TBD Medial Branch Nerves)     Sedation: Patient's choice.     Timeframe: Today    Where will this procedure be performed?:   [facility]   DG [facility] C-ARM 1-60 MIN NO REPORT    Intraoperative interpretation by procedural physician at [facility].    Standing Status:   Standing    Number of Occurrences:   1    Reason for exam::   Assistance in needle guidance and placement for procedures requiring needle placement in or near specific anatomical locations not easily accessible without such assistance.   Informed Consent Details: Physician/Practitioner Attestation; Transcribe to consent form and obtain patient signature    Nursing Order: Transcribe to consent form and obtain patient signature. Note: Always confirm laterality of pain with Wanda Martinez, before procedure.    Physician/Practitioner attestation of informed consent for procedure/surgical case:   I, the physician/practitioner, attest that I have discussed with the patient the benefits, risks, side effects, alternatives, likelihood of achieving goals and potential problems during recovery for the procedure that I have provided informed consent.    Procedure:   Lumbar Facet Block  under fluoroscopic  guidance    Physician/Practitioner performing the procedure:   Joi Leyva A. Laban Emperor MD    Indication/Reason:   Low Back Pain, with our without leg pain, due to Facet Joint Arthralgia (Joint Pain) Spondylosis (Arthritis of the Spine), without myelopathy or radiculopathy (Nerve Damage).   Provide equipment / supplies at bedside    Procedure tray: "Block Tray" (Disposable  single use) Skin infiltration needle: Regular 1.5-in, 25-G, (x1) Block Needle type: Spinal Amount/quantity: 4 Size: Regular (3.5-inch) Gauge: 22G    Standing Status:   Standing    Number of Occurrences:   1    Specify:   Block Tray   Saline lock IV    Have LR [phone] mL available and administer at 125 mL/hr if patient becomes hypotensive.    Standing Status:   Standing    Number of Occurrences:   1   Chronic Opioid Analgesic:   Oxycodone IR 5 mg, 1 tab PO q 8 hrs (15 mg/day of oxycodone) MME/day: 22.5 mg/day.   Medications ordered for procedure: Meds ordered this encounter  Medications   lidocaine (XYLOCAINE) 2 % (with pres) injection 400 mg   pentafluoroprop-tetrafluoroeth (GEBAUERS) aerosol   midazolam (VERSED) injection 0.5-2 mg    Make sure Flumazenil is available in the pyxis when using this medication.  If oversedation occurs, administer 0.2 mg IV over 15 sec. If after 45 sec no response, administer 0.2 mg again over 1 min; may repeat at 1 min intervals; not to exceed 4 doses (1 mg)   ropivacaine (PF) 2 mg/mL (0.2%) (NAROPIN) injection 18 mL   triamcinolone acetonide (KENALOG-40) injection 80 mg   Medications administered: We administered lidocaine, pentafluoroprop-tetrafluoroeth, midazolam, ropivacaine (PF) 2 mg/mL (0.2%), and triamcinolone acetonide.  See the medical record for exact dosing, route, and time of administration.  Follow-up plan:   Return in about 2 weeks (around 02/19/2024) for (Face2F), (PPE).       Interventional Therapies  Risk Factors  Considerations  Medical Comorbidities:   Parkinson's disease  Hx right breast cancer  OIC  SOB  CKD Stage 3b  GERD  depression  osteopenia/osteoporosis  PSVT  Memory impairment      Planned  Pending:   Therapeutic right sided suprascapular nerve RFA #1    Under consideration:  Therapeutic right sided suprascapular nerve RFA #1  Possible intrathecal pump trial and permanent implant    Completed:   Diagnostic/therapeutic right suprascapular NB x2 (10/16/2023) (100/100/75 x 5 days/0) Therapeutic right trapezius TPI x4 (07/17/2023) (100/100/0/0)  Therapeutic right thoracic TPI x2 (10/17/2018) (DKFUA)  Therapeutic right QL & ESM TPI x1 (02/12/2018)   Diagnostic right SI joint Blk x3 (12/21/2020)  (DKFUA)  Therapeutic right SI joint RFA x4 (03/01/2023) (100/100/70/70)   Therapeutic right lumbar facet MBB (FCT: L3-4,L4-5,L5-S1) x6 (01/15/2024) (100/100/85/85)  Therapeutic right lumbar facet MBB (FCT: L4-5, L5-S1) x7 (11/01/2023) (100/100/70/R:70)  Therapeutic right lumbar facet MBB (FCT: L5-S1) x1 (08/29/2022) (100/100/100/0)  Therapeutic right lumbar facet RFA x4 (03/01/2023) (100/100/70/70)   Therapeutic bilateral L5 and L2 TFESI x1 (07/03/2023) (100/90/50/L:100R:20))  Therapeutic right L3 and L4 TFESI x1 (07/27/2022) (100/100/75/50)  Therapeutic right L5 TFESI x4 (02/14/2022) (100/100/70/75)  Therapeutic right S1 TFESI x1 (11/04/2020) (100/100/20/<50)   Therapeutic right L3-4 LESI x2 (11/04/2020) (100/100/20/<50)  Therapeutic right L4-5 LESI x2 (03/30/2022) (100/100/75/75)  Therapeutic right L5-S1 LESI x2 (12/04/2019) (100/100/50/75)  Therapeutic midline L2-3 LESI x1 (10/04/2017) (100/100/85/>50)   Diagnostic bilateral superficial trochanteric bursa injec. x1 (04/30/2018) (DKFUA)  Diagnostic right IA hip injection x1 (02/13/2019) (DKFUA)   Diagnostic/therapeutic right IA steroid knee inj. x5 (07/31/2023) (100/100/100/80)  Therapeutic right (Zilretta) knee injection x3 (11/13/2023) (100/100/75/75) Therapeutic right  (Monovisc) knee injection x1 (06/07/2021) (100/100/100/75)  Therapeutic right pes anserine bursa inj. x2 (12/25/2023) (100/100/50/50)  Therapeutic left thoracic TPI x1 (10/17/2018) (DKFUA)  Diagnostic left SI joint Blk x2 (04/12/2023) (100/90/20/<25)   Therapeutic left lumbar facet MBB (L2-S1) (FCT: L3-4,L4-5,L5-S1) x6 (01/15/2024) (100/100/85/85)  Therapeutic left lumbar facet MBB (FCT: L4-5, L5-S1) x7 (11/01/2023) (100/100/70/L: 85)  Therapeutic left lumbar facet MBB (L4-S1) (FCT: L5-S1) x1 (08/29/2022) (100/100/100/100)  Therapeutic left lumbar facet RFA x2 (11/30/2022) (100/60/90/>75)    Therapeutic  Palliative (PRN) options:   Do not schedule any procedures without a proper preprocedure evaluation.   Completed by other providers:   Therapeutic right knee (ORTHOVISC) inj. x3 (05/18/2023, 05/23/2023, 05/30/2023) by Aram Candela, MD [facility]) Therapeutic left knee (Kenalog) inj. x1 (08/25/2020) by Novella Olive, MD [facility])   Pharmacotherapy  Nonopioids transfer 12/21/2020: Gabapentin       Recent Visits Date Type Provider Dept  01/29/24 Office Visit [name], MD [facility]  01/15/24 Procedure visit [name], MD [facility]  12/25/23 Office Visit [name], MD [facility]  12/06/23 Procedure visit [name], MD [facility]  11/28/23 Office  Visit [name], MD [facility]  11/13/23 Procedure visit [name], MD [facility]  11/12/23 Office Visit [name], MD [facility]  Showing recent visits within past 90 days and meeting all other requirements Today's Visits Date Type Provider Dept  02/05/24 Procedure visit [name], MD [facility]  Showing today's visits and meeting all other requirements Future Appointments Date Type Provider Dept  02/11/24 Appointment [name], MD [facility]  02/26/24  Appointment [name], MD [facility]  Showing future appointments within next 90 days and meeting all other requirements  Disposition: Discharge home  Discharge (Date  Time): 02/05/2024; 1306 hrs.   Primary Care Physician: Dale [place], MD Location: [facility] Note by: Oswaldo Done, MD (TTS technology used. I apologize for any typographical errors that were not detected and corrected.) Date: 02/05/2024; Time: 5:26 PM  Disclaimer:  Medicine is not an Visual merchandiser. The only guarantee in medicine is that nothing is guaranteed. It is important to note that the decision to proceed with this intervention was based on the information collected from the patient. The Data and conclusions were drawn from the patient's questionnaire, the interview, and the physical examination. Because the information was provided in large part by the patient, it cannot be guaranteed that it has not been purposely or unconsciously manipulated. Every effort has been made to obtain as much relevant data as possible for this evaluation. It is important to note that the conclusions that lead to this procedure are derived in large part from the available data. Always take into account that the treatment will also be dependent on availability of resources and existing treatment guidelines, considered by other Pain Management Practitioners as being common knowledge and practice, at the time of the intervention. For Medico-Legal purposes, it is also important to point out that variation in procedural techniques and pharmacological choices are the acceptable norm. The indications, contraindications, technique, and results of the above procedure should only be interpreted and judged by a Board-Certified Interventional Pain Specialist with extensive familiarity and expertise in the same exact procedure and technique.

## 2024-02-06 ENCOUNTER — Other Ambulatory Visit: Payer: Self-pay | Admitting: Internal Medicine

## 2024-02-06 ENCOUNTER — Telehealth: Payer: Self-pay | Admitting: *Deleted

## 2024-02-06 NOTE — Telephone Encounter (Signed)
Called pt to confirm if taking losartan . Unable to reach pt home number gives a busy signal and vm is full on mobile number

## 2024-02-06 NOTE — Telephone Encounter (Signed)
 No problems post procedure.

## 2024-02-08 NOTE — Telephone Encounter (Signed)
Spoke to Wanda Martinez and Wanda Martinez stated that she was unsure if this pill was included in her package or not informed Wanda Martinez I would give the pharmacy a call to verify. Spoke to pharmacist Ulice Brilliant he confirmed that Wanda Martinez is on Losartan informed him I would send the refill request in.

## 2024-02-10 NOTE — Patient Instructions (Incomplete)

## 2024-02-10 NOTE — Progress Notes (Unsigned)
 PROVIDER NOTE: Information contained herein reflects review and annotations entered in association with encounter. Interpretation of such information and data should be left to medically-trained personnel. Information provided to patient can be located elsewhere in the medical record under "Patient Instructions". Document created using STT-dictation technology, any transcriptional errors that may result from process are unintentional.    Patient: Wanda Martinez Number  Service Category: E/M  Provider: Oswaldo Done, MD  DOB: 08-26-1935  DOS: 02/11/2024  Referring Provider: Dale [place], MD  MRN: [id number]  Specialty: Interventional Pain Management  PCP: Dale Coosada, MD  Type: Established Patient  Setting: Ambulatory outpatient    Location: Office  Delivery: Face-to-face     HPI  Wanda Martinez, a 89 y.o. year old female, is here today because of her No primary diagnosis found.. [name] primary complain today is No chief complaint on file.  Pertinent problems: Ms. Tschida has Headache; Parkinson's disease (HCC); Chronic low back pain (1ry area of Pain) (Bilateral) (R>L) w/o sciatica; Lumbar spondylosis; Chronic hip pain (Bilateral); Chronic neck pain; Cervical spondylosis; Chronic cervical radicular pain (Right); Diffuse myofascial pain syndrome; Neurogenic pain; Chronic upper back pain (Right); Myofascial pain syndrome (Right) (cervicothoracic); Lumbar facet syndrome (Bilateral) (R>L); History of breast cancer; Cervical facet hypertrophy; Cervical facet syndrome (HCC); Chronic shoulder pain (Right); Chronic pain syndrome; Chronic sacroiliac joint pain (Left); Chronic sacroiliac joint pain (Right); 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, L4-5, L5-S1) (Right); Lumbar spinal stenosis (with neurogenic claudication) (L3-4); Chronic lower extremity pain (Right); Chronic lumbar radicular pain (Right); Trochanteric bursitis of hip (Bilateral); Spondylosis without myelopathy or radiculopathy,  lumbosacral region; Trigger point with back pain (Right); DDD (degenerative disc disease), lumbosacral; Chronic upper extremity pain (Right); Chronic thoracic back pain (Bilateral) (L>R); Trigger point of thoracic region (Bilateral) (L>R); Other specified dorsopathies, sacral and sacrococcygeal region; Sacroiliac joint dysfunction (Right); Osteoarthritis of sacroiliac joint (Right); Somatic dysfunction of sacroiliac joint (Right); Chronic musculoskeletal pain; Facial pain; Chronic hip pain (Right); Osteoarthritis of hip (Right); Left ear pain; 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HCC); Abnormal MRI, lumbar spine (08/23/2022); Osteoarthritis involving multiple joints; Other spondylosis, sacral and sacrococcygeal region; Lumbar facet hypertrophy (Multilevel) (Bilateral); Chronic knee pain (Right); Osteoarthritis of knee (Right); Trigger point of shoulder region (Right); Osteoarthritis of AC (acromioclavicular) joint (Right); Osteoarthritis of shoulder (Right); Unspecified injury of muscle(s) and tendon(s) of the rotator cuff of shoulder, sequela (Right); Cervicalgia; DDD (degenerative disc disease), lumbar; Lumbar radicular pain (Bilateral); Lumbar foraminal stenosis (Right: L3-4, L4-5, L5-S1) (Left: L2-3); Abnormal MRI, hip (Right) (05/04/2022); Abnormal MRI, knee (Right) (11/29/2023); Tricompartment osteoarthritis of knee (Right); Chronic low back pain (Right) w/o sciatica; Hoffa's fat pad disease (HCC) (Right); Hoffa's pad syndrome (HCC) (Right); Chronic low back pain (Right) w/ sciatica (Right); Lower extremity weakness (Right); Chronic radicular pain of lower back; Chronic sciatica (Right); Lumbosacral radiculopathy at L5 (Right); Low back pain of over 3 months duration; Personal history of breast cancer; Pes anserine bursitis (Right); Infrapatellar bursitis of knee (Right); Traumatic ecchymosis of right lower leg; Lumbar facet joint pain; Medial knee pain (Right); Chronic patellofemoral pain of knee (Right);  Patellofemoral joint pain (Right); Knee hemarthrosis (Right); Pes anserinus bursitis of right knee; and Abnormal MRI, shoulder (Right) (06/21/2023) on their pertinent problem list. Pain Assessment: Severity of   is reported as a  /10. Location:    / . Onset:  . Quality:  . Timing:  . Modifying factor(s):  Marland Kitchen Vitals:  vitals were not taken for this visit.  BMI: Estimated body mass index is 28.7 kg/m as  calculated from the following:   Height as of 02/05/24: 5\' 3"  (1.6 m).   Weight as of 02/05/24: 162 lb (73.5 kg). Last encounter: 01/29/2024. Last procedure: 02/05/2024.  Reason for encounter: medication management. ***  Discussed the use of AI scribe software for clinical note transcription with the patient, who gave verbal consent to proceed.  History of Present Illness         RTCB: 05/12/2024   Pharmacotherapy Assessment  Analgesic: Oxycodone IR 5 mg, 1 tab PO q 8 hrs (15 mg/day of oxycodone) MME/day: 22.5 mg/day.   Monitoring: [place] PMP: PDMP reviewed during this encounter.       Pharmacotherapy: No side-effects or adverse reactions reported. Compliance: No problems identified. Effectiveness: Clinically acceptable.  No notes on file  No results found for: "CBDTHCR" No results found for: "D8THCCBX" No results found for: "D9THCCBX"  UDS:  Summary  Date Value Ref Range Status  07/31/2023 Note  Final    Comment:    ==================================================================== [employer] (MW) ==================================================================== Test                             Result       Flag       Units  Drug Present and Declared for Prescription Verification   Oxycodone                      839          EXPECTED   ng/mg creat   Noroxycodone                   3412         EXPECTED   ng/mg creat    Sources of oxycodone include scheduled prescription medications.    Noroxycodone is an expected metabolite of oxycodone.  Drug Present not Declared  for Prescription Verification   Hydrocodone                    340          UNEXPECTED ng/mg creat   Dihydrocodeine                 32           UNEXPECTED ng/mg creat   Norhydrocodone                 403          UNEXPECTED ng/mg creat    Sources of hydrocodone include scheduled prescription medications.    Dihydrocodeine and norhydrocodone are expected metabolites of    hydrocodone. Dihydrocodeine is also available as a scheduled    prescription medication.  ==================================================================== Test                      Result    Flag   Units      Ref Range   Creatinine              208              mg/dL      >=16 ==================================================================== Declared Medications:  The flagging and interpretation on this report are based on the  following declared medications.  Unexpected results may arise from  inaccuracies in the declared medications.   **Note: The testing scope of this panel includes these medications:   Oxycodone (Roxicodone)   **Note: The testing scope of this panel does not  include the  following reported medications:   Amlodipine (Norvasc)  Aspirin  Calcium  Chondroitin  Ciprofloxacin (Cipro)  Citalopram (Celexa)  Cyclobenzaprine (Flexeril)  Donepezil (Aricept)  Erythromycin  Gabapentin (Neurontin)  Glucosamine  Hydrocortisone (Anusol-HC)  Letrozole (Femara)  Losartan (Cozaar)  Lubiprostone (Amitiza)  Magnesium (Mag-Ox)  Melatonin  Metoprolol (Toprol)  Mirabegron (Myrbetriq)  Multivitamin  Naloxone (Narcan)  Nitrofurantoin (Macrobid)  Omega-3 Fatty Acids  Pantoprazole (Protonix)  Rosuvastatin (Crestor) ==================================================================== For clinical consultation, please call [phone]. ====================================================================       ROS  Constitutional: Denies any fever or chills Gastrointestinal: No reported  hemesis, hematochezia, vomiting, or acute GI distress Musculoskeletal: Denies any acute onset joint swelling, redness, loss of ROM, or weakness Neurological: No reported episodes of acute onset apraxia, aphasia, dysarthria, agnosia, amnesia, paralysis, loss of coordination, or loss of consciousness  Medication Review  Glucosamine-Chondroitin, Omega-3, amLODipine, aspirin EC, calcium carbonate, citalopram, cyclobenzaprine, donepezil, gabapentin, hydrochlorothiazide, letrozole, losartan, lubiprostone, magnesium oxide, melatonin, metoprolol succinate, mirabegron ER, multivitamin, naloxone, nitrofurantoin (macrocrystal-monohydrate), oxyCODONE, pantoprazole, and rosuvastatin  History Review  Allergy: Ms. Nanez is allergic to sulfa antibiotics and vesicare [solifenacin]. Drug: Ms. Schoenberger  reports no history of drug use. Alcohol:  reports no history of alcohol use. Tobacco:  reports that she has never smoked. She has never been exposed to tobacco smoke. She has never used smokeless tobacco. Social: Ms. Buzzelli  reports that she has never smoked. She has never been exposed to tobacco smoke. She has never used smokeless tobacco. She reports that she does not drink alcohol and does not use drugs. Medical:  has a past medical history of Acute postoperative pain (10/25/2017), Anemia, Arm pain (07/26/2015), Arthritis, Arthritis, degenerative (03/26/2014), Back pain (11/01/2013), Bladder infection (06/2018), Breast cancer (HCC), Breast cancer (HCC), CHEST PAIN (04/29/2010), Chronic cystitis, Cystocele (02/22/2013), Cystocele, midline (08/19/2013), Degeneration of intervertebral disc of lumbosacral region (03/26/2014), DYSPNEA (04/29/2010), Enthesopathy of hip (03/26/2014), GERD (gastroesophageal reflux disease), Hiatal hernia, HTN (hypertension), Hypokalemia (06/2018), Hyponatremia (06/2018), LBP (low back pain) (03/26/2014), Neck pain (11/01/2013), Parkinson disease (HCC), Pneumonia (06/2018), Sinusitis (02/07/2015), Skin lesions  (07/12/2014), and Urinary incontinence. Surgical: Ms. Favela  has a past surgical history that includes Tonsillectomy and adenoidectomy (1979); Vesicovaginal fistula closure w/ TAH (1983); Breast surgery (1986); Breast enhancement surgery (1987); Breast implant removal; Breast implant removal (Right, 08/29/2012); Mastectomy (08/2012); Abdominal hysterectomy; Colonoscopy with propofol (N/A, 09/13/2016); Breast biopsy (2013); and Blepharoplasty. Family: family history includes Colon polyps in her father; Diabetes in her father; Parkinson's disease in her mother; Stroke in her father and mother.  Laboratory Chemistry Profile   Renal Lab Results  Component Value Date   BUN 27 (H) 01/30/2024   CREATININE 0.84 01/30/2024   BCR 20 06/11/2020   GFR 55.80 (L) 12/05/2023   GFRAA >60 07/27/2020   GFRNONAA >60 01/30/2024    Hepatic Lab Results  Component Value Date   AST 25 09/11/2023   ALT 19 09/11/2023   ALBUMIN 3.8 09/11/2023   ALKPHOS 32 (L) 09/11/2023    Electrolytes Lab Results  Component Value Date   NA 134 (L) 01/30/2024   K 3.7 01/30/2024   CL 103 01/30/2024   CALCIUM 9.1 01/30/2024   MG 2.2 07/17/2018    Bone Lab Results  Component Value Date   VD25OH 66.95 09/26/2022   25OHVITD1 42 07/17/2018   25OHVITD2 <1.0 07/17/2018   25OHVITD3 42 07/17/2018    Inflammation (CRP: Acute Phase) (ESR: Chronic Phase) Lab Results  Component Value Date   CRP 3 07/17/2018   ESRSEDRATE 27  07/17/2018         Note: Above Lab results reviewed.  Recent Imaging Review  DG PAIN CLINIC C-ARM 1-60 MIN NO REPORT Fluoro was used, but no Radiologist interpretation will be provided.  Please refer to "NOTES" tab for provider progress note. Note: Reviewed        Physical Exam  General appearance: Well nourished, well developed, and well hydrated. In no apparent acute distress Mental status: Alert, oriented x 3 (person, place, & time)       Respiratory: No evidence of acute respiratory  distress Eyes: PERLA Vitals: LMP 12/18/1981  BMI: Estimated body mass index is 28.7 kg/m as calculated from the following:   Height as of 02/05/24: 5\' 3"  (1.6 m).   Weight as of 02/05/24: 162 lb (73.5 kg). Ideal: Ideal body weight: 52.4 kg (115 lb 8.3 oz) Adjusted ideal body weight: 60.8 kg (134 lb 1.8 oz)  Assessment   Diagnosis Status  1. Chronic pain of right knee   2. Primary osteoarthritis of right knee   3. Tricompartment osteoarthritis of right knee   4. Chronic upper back pain   5. Chronic right shoulder pain   6. Unspecified injury of muscle(s) and tendon(s) of the rotator cuff of right shoulder, sequela   7. Facet syndrome, lumbar   8. Chronic pain syndrome   9. Pharmacologic therapy   10. Chronic use of opiate for therapeutic purpose   11. Encounter for medication management   12. Encounter for chronic pain management    Controlled Controlled Controlled   Updated Problems: No problems updated.  Plan of Care  Problem-specific:  Assessment and Plan            Ms. Arlissa Monteverde has a current medication list which includes the following long-term medication(s): amlodipine, calcium carbonate, citalopram, citalopram, cyclobenzaprine, donepezil, gabapentin, losartan, magnesium oxide, metoprolol succinate, mirabegron er, oxycodone, pantoprazole, and rosuvastatin.  Pharmacotherapy (Medications Ordered): No orders of the defined types were placed in this encounter.  Orders:  No orders of the defined types were placed in this encounter.  Follow-up plan:   No follow-ups on file.      Interventional Therapies  Risk Factors  Considerations  Medical Comorbidities:  Parkinson's disease  Hx right breast cancer  OIC  SOB  CKD Stage 3b  GERD  depression  osteopenia/osteoporosis  PSVT  Memory impairment      Planned  Pending:   Therapeutic right sided suprascapular nerve RFA #1    Under consideration:  Therapeutic right sided suprascapular nerve  RFA #1  Possible intrathecal pump trial and permanent implant    Completed:   Diagnostic/therapeutic right suprascapular NB x2 (10/16/2023) (100/100/75 x 5 days/0) Therapeutic right trapezius TPI x4 (07/17/2023) (100/100/0/0)  Therapeutic right thoracic TPI x2 (10/17/2018) (DKFUA)  Therapeutic right QL & ESM TPI x1 (02/12/2018)   Diagnostic right SI joint Blk x3 (12/21/2020)  (DKFUA)  Therapeutic right SI joint RFA x4 (03/01/2023) (100/100/70/70)   Therapeutic right lumbar facet MBB (FCT: L3-4,L4-5,L5-S1) x6 (01/15/2024) (100/100/85/85)  Therapeutic right lumbar facet MBB (FCT: L4-5, L5-S1) x7 (11/01/2023) (100/100/70/R:70)  Therapeutic right lumbar facet MBB (FCT: L5-S1) x1 (08/29/2022) (100/100/100/0)  Therapeutic right lumbar facet RFA x4 (03/01/2023) (100/100/70/70)   Therapeutic bilateral L5 and L2 TFESI x1 (07/03/2023) (100/90/50/L:100R:20))  Therapeutic right L3 and L4 TFESI x1 (07/27/2022) (100/100/75/50)  Therapeutic right L5 TFESI x4 (02/14/2022) (100/100/70/75)  Therapeutic right S1 TFESI x1 (11/04/2020) (100/100/20/<50)   Therapeutic right L3-4 LESI x2 (11/04/2020) (100/100/20/<50)  Therapeutic right L4-5 LESI  x2 (03/30/2022) (100/100/75/75)  Therapeutic right L5-S1 LESI x2 (12/04/2019) (100/100/50/75)  Therapeutic midline L2-3 LESI x1 (10/04/2017) (100/100/85/>50)   Diagnostic bilateral superficial trochanteric bursa injec. x1 (04/30/2018) (DKFUA)  Diagnostic right IA hip injection x1 (02/13/2019) (DKFUA)   Diagnostic/therapeutic right IA steroid knee inj. x5 (07/31/2023) (100/100/100/80)  Therapeutic right (Zilretta) knee injection x3 (11/13/2023) (100/100/75/75) Therapeutic right (Monovisc) knee injection x1 (06/07/2021) (100/100/100/75)  Therapeutic right pes anserine bursa inj. x2 (12/25/2023) (100/100/50/50)  Therapeutic left thoracic TPI x1 (10/17/2018) (DKFUA)  Diagnostic left SI joint Blk x2 (04/12/2023) (100/90/20/<25)   Therapeutic left lumbar facet MBB (L2-S1)  (FCT: L3-4,L4-5,L5-S1) x6 (01/15/2024) (100/100/85/85)  Therapeutic left lumbar facet MBB (FCT: L4-5, L5-S1) x7 (11/01/2023) (100/100/70/L: 85)  Therapeutic left lumbar facet MBB (L4-S1) (FCT: L5-S1) x1 (08/29/2022) (100/100/100/100)  Therapeutic left lumbar facet RFA x2 (11/30/2022) (100/60/90/>75)    Therapeutic  Palliative (PRN) options:   Do not schedule any procedures without a proper preprocedure evaluation.   Completed by other providers:   Therapeutic right knee (ORTHOVISC) inj. x3 (05/18/2023, 05/23/2023, 05/30/2023) by Aram Candela, MD [facility]) Therapeutic left knee (Kenalog) inj. x1 (08/25/2020) by Novella Olive, MD [facility])   Pharmacotherapy  Nonopioids transfer 12/21/2020: Gabapentin       Recent Visits Date Type Provider Dept  02/05/24 Procedure visit [name], MD [facility]  01/29/24 Office Visit [name], MD [facility]  01/15/24 Procedure visit [name], MD [facility]  12/25/23 Office Visit [name], MD [facility]  12/06/23 Procedure visit [name], MD [facility]  11/28/23 Office Visit [name], MD [facility]  11/13/23 Procedure visit [name], MD [facility]  11/12/23 Office Visit [name], MD [facility]  Showing recent visits within past 90 days and meeting all other requirements Future Appointments Date Type Provider Dept  02/11/24 Appointment [name], MD [facility]  02/26/24 Appointment [name], MD [facility]  Showing future appointments within next 90 days and meeting all other requirements  I discussed the assessment and treatment plan with the patient. The patient was provided an opportunity to ask questions and all were answered. The patient agreed with the plan and demonstrated an understanding of the instructions.  Patient advised to  call back or seek an in-person evaluation if the symptoms or condition worsens.  Duration of encounter: *** minutes.  Total time on encounter, as per AMA guidelines included both the face-to-face and non-face-to-face time personally spent by the physician and/or other qualified health care professional(s) on the day of the encounter (includes time in activities that require the physician or other qualified health care professional and does not include time in activities normally performed by clinical staff). Physician's time may include the following activities when performed: Preparing to see the patient (e.g., pre-charting review of records, searching for previously ordered imaging, lab work, and nerve conduction tests) Review of prior analgesic pharmacotherapies. Reviewing PMP Interpreting ordered tests (e.g., lab work, imaging, nerve conduction tests) Performing post-procedure evaluations, including interpretation of diagnostic procedures Obtaining and/or reviewing separately obtained history Performing a medically appropriate examination and/or evaluation Counseling and educating the patient/family/caregiver Ordering medications, tests, or procedures Referring and communicating with other health care professionals (when not separately reported) Documenting clinical information in the electronic or other health record Independently interpreting results (not separately reported) and communicating results to the patient/ family/caregiver Care coordination (not separately reported)  Note by: Oswaldo Done, MD Date: 02/11/2024; Time: 2:35 PM

## 2024-02-11 ENCOUNTER — Ambulatory Visit: Payer: Medicare Other | Attending: Pain Medicine | Admitting: Pain Medicine

## 2024-02-11 VITALS — BP 113/71 | HR 83 | Temp 97.3°F | Resp 16 | Ht 63.0 in | Wt 162.0 lb

## 2024-02-11 DIAGNOSIS — M25511 Pain in right shoulder: Secondary | ICD-10-CM | POA: Diagnosis not present

## 2024-02-11 DIAGNOSIS — G894 Chronic pain syndrome: Secondary | ICD-10-CM | POA: Diagnosis not present

## 2024-02-11 DIAGNOSIS — M549 Dorsalgia, unspecified: Secondary | ICD-10-CM | POA: Diagnosis not present

## 2024-02-11 DIAGNOSIS — M19011 Primary osteoarthritis, right shoulder: Secondary | ICD-10-CM | POA: Insufficient documentation

## 2024-02-11 DIAGNOSIS — Z79899 Other long term (current) drug therapy: Secondary | ICD-10-CM | POA: Insufficient documentation

## 2024-02-11 DIAGNOSIS — Z79891 Long term (current) use of opiate analgesic: Secondary | ICD-10-CM | POA: Insufficient documentation

## 2024-02-11 DIAGNOSIS — G8929 Other chronic pain: Secondary | ICD-10-CM | POA: Diagnosis not present

## 2024-02-11 DIAGNOSIS — M25561 Pain in right knee: Secondary | ICD-10-CM | POA: Insufficient documentation

## 2024-02-11 DIAGNOSIS — M1711 Unilateral primary osteoarthritis, right knee: Secondary | ICD-10-CM | POA: Insufficient documentation

## 2024-02-11 DIAGNOSIS — M47816 Spondylosis without myelopathy or radiculopathy, lumbar region: Secondary | ICD-10-CM | POA: Diagnosis not present

## 2024-02-11 DIAGNOSIS — S46001S Unspecified injury of muscle(s) and tendon(s) of the rotator cuff of right shoulder, sequela: Secondary | ICD-10-CM | POA: Insufficient documentation

## 2024-02-11 MED ORDER — OXYCODONE HCL 5 MG PO TABS
5.0000 mg | ORAL_TABLET | Freq: Four times a day (QID) | ORAL | 0 refills | Status: DC | PRN
Start: 2024-02-12 — End: 2024-03-16

## 2024-02-11 MED ORDER — OXYCODONE HCL 5 MG PO TABS: 5.0000 mg | ORAL_TABLET | Freq: Four times a day (QID) | ORAL | 0 refills | Status: DC | PRN

## 2024-02-11 NOTE — Progress Notes (Signed)
 Nursing Pain Medication Assessment:  Safety precautions to be maintained throughout the outpatient stay will include: orient to surroundings, keep bed in low position, maintain call bell within reach at all times, provide assistance with transfer out of bed and ambulation.  Medication Inspection Compliance: Pill count conducted under aseptic conditions, in front of the patient. Neither the pills nor the bottle was removed from the patient's sight at any time. Once count was completed pills were immediately returned to the patient in their original bottle.  Medication: Oxycodone IR Pill/Patch Count:  4 of 120 pills remain Pill/Patch Appearance: Markings consistent with prescribed medication Bottle Appearance: Standard pharmacy container. Clearly labeled. Filled Date: 1 / 104 / 2025 Last Medication intake:  Today

## 2024-02-18 ENCOUNTER — Encounter: Payer: Self-pay | Admitting: Internal Medicine

## 2024-02-18 ENCOUNTER — Ambulatory Visit: Payer: Medicare Other | Admitting: Internal Medicine

## 2024-02-18 VITALS — BP 138/84 | HR 80 | Ht 64.0 in | Wt 159.6 lb

## 2024-02-18 DIAGNOSIS — I471 Supraventricular tachycardia, unspecified: Secondary | ICD-10-CM | POA: Diagnosis not present

## 2024-02-18 DIAGNOSIS — E7800 Pure hypercholesterolemia, unspecified: Secondary | ICD-10-CM | POA: Diagnosis not present

## 2024-02-18 DIAGNOSIS — Z01818 Encounter for other preprocedural examination: Secondary | ICD-10-CM

## 2024-02-18 DIAGNOSIS — K219 Gastro-esophageal reflux disease without esophagitis: Secondary | ICD-10-CM

## 2024-02-18 DIAGNOSIS — Z853 Personal history of malignant neoplasm of breast: Secondary | ICD-10-CM

## 2024-02-18 DIAGNOSIS — F320 Major depressive disorder, single episode, mild: Secondary | ICD-10-CM

## 2024-02-18 DIAGNOSIS — I700 Atherosclerosis of aorta: Secondary | ICD-10-CM

## 2024-02-18 DIAGNOSIS — D649 Anemia, unspecified: Secondary | ICD-10-CM

## 2024-02-18 DIAGNOSIS — I10 Essential (primary) hypertension: Secondary | ICD-10-CM

## 2024-02-18 DIAGNOSIS — N1831 Chronic kidney disease, stage 3a: Secondary | ICD-10-CM

## 2024-02-18 DIAGNOSIS — R739 Hyperglycemia, unspecified: Secondary | ICD-10-CM

## 2024-02-18 DIAGNOSIS — S81802A Unspecified open wound, left lower leg, initial encounter: Secondary | ICD-10-CM | POA: Diagnosis not present

## 2024-02-18 DIAGNOSIS — M810 Age-related osteoporosis without current pathological fracture: Secondary | ICD-10-CM | POA: Diagnosis not present

## 2024-02-18 DIAGNOSIS — S81809A Unspecified open wound, unspecified lower leg, initial encounter: Secondary | ICD-10-CM

## 2024-02-18 LAB — CBC WITH DIFFERENTIAL/PLATELET
Basophils Absolute: 0 10*3/uL (ref 0.0–0.1)
Basophils Relative: 0.6 % (ref 0.0–3.0)
Eosinophils Absolute: 0.1 10*3/uL (ref 0.0–0.7)
Eosinophils Relative: 1 % (ref 0.0–5.0)
HCT: 35.2 % — ABNORMAL LOW (ref 36.0–46.0)
Hemoglobin: 11.7 g/dL — ABNORMAL LOW (ref 12.0–15.0)
Lymphocytes Relative: 18.2 % (ref 12.0–46.0)
Lymphs Abs: 1.5 10*3/uL (ref 0.7–4.0)
MCHC: 33.2 g/dL (ref 30.0–36.0)
MCV: 94 fl (ref 78.0–100.0)
Monocytes Absolute: 0.6 10*3/uL (ref 0.1–1.0)
Monocytes Relative: 7.2 % (ref 3.0–12.0)
Neutro Abs: 6 10*3/uL (ref 1.4–7.7)
Neutrophils Relative %: 73 % (ref 43.0–77.0)
Platelets: 239 10*3/uL (ref 150.0–400.0)
RBC: 3.75 Mil/uL — ABNORMAL LOW (ref 3.87–5.11)
RDW: 15.5 % (ref 11.5–15.5)
WBC: 8.2 10*3/uL (ref 4.0–10.5)

## 2024-02-18 LAB — HEMOGLOBIN A1C: Hgb A1c MFr Bld: 5.7 % (ref 4.6–6.5)

## 2024-02-18 LAB — LIPID PANEL
Cholesterol: 127 mg/dL (ref 0–200)
HDL: 49.3 mg/dL (ref >39.00)
LDL Cholesterol: 59 mg/dL (ref 0–99)
NonHDL: 77.68
Total CHOL/HDL Ratio: 3
Triglycerides: 91 mg/dL (ref 0.0–149.0)
VLDL: 18.2 mg/dL (ref 0.0–40.0)

## 2024-02-18 LAB — BASIC METABOLIC PANEL
BUN: 28 mg/dL — ABNORMAL HIGH (ref 6–23)
CO2: 28 meq/L (ref 19–32)
Calcium: 9 mg/dL (ref 8.4–10.5)
Chloride: 104 meq/L (ref 96–112)
Creatinine, Ser: 1.09 mg/dL (ref 0.40–1.20)
GFR: 46.06 mL/min — ABNORMAL LOW (ref >60.00)
Glucose, Bld: 81 mg/dL (ref 70–99)
Potassium: 4.1 meq/L (ref 3.5–5.1)
Sodium: 139 meq/L (ref 135–145)

## 2024-02-18 LAB — HEPATIC FUNCTION PANEL
ALT: 21 U/L (ref 0–35)
AST: 19 U/L (ref 0–37)
Albumin: 4 g/dL (ref 3.5–5.2)
Alkaline Phosphatase: 25 U/L — ABNORMAL LOW (ref 39–117)
Bilirubin, Direct: 0.1 mg/dL (ref 0.0–0.3)
Total Bilirubin: 0.4 mg/dL (ref 0.2–1.2)
Total Protein: 6.9 g/dL (ref 6.0–8.3)

## 2024-02-18 MED ORDER — DOXYCYCLINE HYCLATE 100 MG PO TABS
100.0000 mg | ORAL_TABLET | Freq: Two times a day (BID) | ORAL | 0 refills | Status: DC
Start: 2024-02-18 — End: 2024-02-25

## 2024-02-18 NOTE — Progress Notes (Signed)
 Subjective:    Patient ID: Wanda Martinez, female    DOB: 1935-05-28, 89 y.o.   MRN: [id number]  Patient here for  Chief Complaint  Patient presents with   Pre-op Exam    HPI Here for a pre op evaluation. Had f/u with Dr Orlie Dakin 01/30/24 - hgb improved - 11.3. receiving zometa. She has had chronic pain. Followed by pain clinic. Here for pre op clearance for implant surgery. Per note, will require general anesthesia. She feels her breathing is stable. No increased cough or congestion. No chest pain. Bowels stable. She has had recent injury with resulting open skin lesion with surrounding erythema. Discussed would need this healed prior to procedure.    Past Medical History:  Diagnosis Date   Acute postoperative pain 10/25/2017   Anemia    Arm pain 07/26/2015   Arthritis    Arthritis, degenerative 03/26/2014   Back pain 11/01/2013   Bladder infection 06/2018   Breast cancer (HCC)    Masectomy - left - 1986    Breast cancer [facility])    Mastectomy-right -2014   CHEST PAIN 04/29/2010   Qualifier: Diagnosis of  By: Laural Benes RN, Erika     Chronic cystitis    Cystocele 02/22/2013   Cystocele, midline 08/19/2013   Degeneration of intervertebral disc of lumbosacral region 03/26/2014   DYSPNEA 04/29/2010   Qualifier: Diagnosis of  By: Laural Benes RN, Erika     Enthesopathy of hip 03/26/2014   GERD (gastroesophageal reflux disease)    Hiatal hernia    HTN (hypertension)    Hypokalemia 06/2018   Hyponatremia 06/2018   LBP (low back pain) 03/26/2014   Neck pain 11/01/2013   Parkinson disease (HCC)    Pneumonia 06/2018   Sinusitis 02/07/2015   Skin lesions 07/12/2014   Urinary incontinence    mixed    Past Surgical History:  Procedure Laterality Date   ABDOMINAL HYSTERECTOMY     BLEPHAROPLASTY     BREAST BIOPSY  2013   BREAST ENHANCEMENT SURGERY  1987   BREAST IMPLANT REMOVAL     BREAST IMPLANT REMOVAL Right 08/29/2012   BREAST SURGERY  1986   s/p left mastectomy   COLONOSCOPY WITH PROPOFOL  N/A 09/13/2016   Procedure: COLONOSCOPY WITH PROPOFOL;  Surgeon: Scot Jun, MD;  Location: [facility] ENDOSCOPY;  Service: Endoscopy;  Laterality: N/A;   MASTECTOMY  08/2012   right   TONSILLECTOMY AND ADENOIDECTOMY  1979   VESICOVAGINAL FISTULA CLOSURE W/ TAH  1983   Family History  Problem Relation Age of Onset   Diabetes Father    Stroke Father    Colon polyps Father    Stroke Mother    Parkinson's disease Mother    Social History   Socioeconomic History   Marital status: Married    Spouse name: Not on file   Martinez of children: 3   Years of education: Not on file   Highest education level: Not on file  Occupational History   Not on file  Tobacco Use   Smoking status: Never    Passive exposure: Never   Smokeless tobacco: Never   Tobacco comments:    tobacco use - no  Vaping Use   Vaping status: Never Used  Substance and Sexual Activity   Alcohol use: No    Alcohol/week: 0.0 standard drinks of alcohol   Drug use: No   Sexual activity: Never  Other Topics Concern   Not on file  Social History Narrative   married  Social Drivers of Corporate investment banker Strain: Low Risk  (01/16/2024)   Overall Financial Resource Strain (CARDIA)    Difficulty of Paying Living Expenses: Not hard at all  Food Insecurity: No Food Insecurity (01/16/2024)   Hunger Vital Sign    Worried About Running Out of Food in the Last Year: Never true    Ran Out of Food in the Last Year: Never true  Transportation Needs: No Transportation Needs (01/16/2024)   PRAPARE - Administrator, Civil Service (Medical): No    Lack of Transportation (Non-Medical): No  Physical Activity: Inactive (01/16/2024)   Exercise Vital Sign    Days of Exercise per Week: 0 days    Minutes of Exercise per Session: 0 min  Stress: No Stress Concern Present (01/16/2024)   [employer] of Occupational Health - Occupational Stress Questionnaire    Feeling of Stress : Only a little  Social  Connections: Moderately Integrated (01/16/2024)   Social Connection and Isolation Panel [NHANES]    Frequency of Communication with Friends and Family: More than three times a week    Frequency of Social Gatherings with Friends and Family: More than three times a week    Attends Religious Services: More than 4 times per year    Active Member of [employer] or Organizations: No    Attends Banker Meetings: Never    Marital Status: Married     Review of Systems  Constitutional:  Negative for appetite change and unexpected weight change.  HENT:  Negative for congestion and sinus pressure.   Respiratory:  Negative for cough, chest tightness and shortness of breath.   Cardiovascular:  Negative for chest pain and palpitations.       No increased swelling.   Gastrointestinal:  Negative for abdominal pain, diarrhea, nausea and vomiting.  Genitourinary:  Negative for difficulty urinating and dysuria.  Musculoskeletal:        Hip and shoulder pain - chronic.   Skin:        Open skin lesion s/p injury. Surrounding erythema.   Neurological:  Negative for dizziness and headaches.  Psychiatric/Behavioral:  Negative for agitation and dysphoric mood.        Objective:     BP 138/84   Pulse 80   Ht 5\' 4"  (1.626 m)   Wt 159 lb 9.6 oz (72.4 kg)   LMP 12/18/1981   SpO2 99%   BMI 27.40 kg/m  Wt Readings from Last 3 Encounters:  02/18/24 159 lb 9.6 oz (72.4 kg)  02/11/24 162 lb (73.5 kg)  02/05/24 162 lb (73.5 kg)    Physical Exam Vitals reviewed.  Constitutional:      General: She is not in acute distress.    Appearance: Normal appearance.  HENT:     Head: Normocephalic and atraumatic.     Right Ear: External ear normal.     Left Ear: External ear normal.     Mouth/Throat:     Pharynx: No oropharyngeal exudate or posterior oropharyngeal erythema.  Eyes:     General: No scleral icterus.       Right eye: No discharge.        Left eye: No discharge.     Conjunctiva/sclera:  Conjunctivae normal.  Neck:     Thyroid: No thyromegaly.  Cardiovascular:     Rate and Rhythm: Normal rate and regular rhythm.  Pulmonary:     Effort: No respiratory distress.     Breath sounds: Normal breath sounds.  No wheezing.  Abdominal:     General: Bowel sounds are normal.     Palpations: Abdomen is soft.     Tenderness: There is no abdominal tenderness.  Musculoskeletal:        General: No swelling or tenderness.     Cervical back: Neck supple. No tenderness.  Lymphadenopathy:     Cervical: No cervical adenopathy.  Skin:    Comments: Open skin lesion - surrounding erythema. (Left lower leg)  Neurological:     Mental Status: She is alert.  Psychiatric:        Mood and Affect: Mood normal.        Behavior: Behavior normal.         Outpatient Encounter Medications as of 02/18/2024  Medication Sig   amLODipine (NORVASC) 2.5 MG tablet Take 1 tablet (2.5 mg total) by mouth daily.   aspirin 81 MG EC tablet Take 81 mg by mouth daily as needed.   calcium carbonate (OSCAL) 1500 (600 Ca) MG TABS tablet Take 1 tablet by mouth daily.   citalopram (CELEXA) 10 MG tablet Take 10 mg by mouth daily.   citalopram (CELEXA) 20 MG tablet TAKE 1 TABLET BY MOUTH ONCE DAILY.   cyclobenzaprine (FLEXERIL) 10 MG tablet TAKE ONE TABLET BY MOUTH AT BEDTIME.   donepezil (ARICEPT) 10 MG tablet TAKE ONE TABLET BY MOUTH AT BEDTIME   doxycycline (VIBRA-TABS) 100 MG tablet Take 1 tablet (100 mg total) by mouth 2 (two) times daily.   gabapentin (NEURONTIN) 300 MG capsule Take 1 capsule (300 mg total) by mouth 2 (two) times daily.   Glucosamine-Chondroitin 500-400 MG CAPS Take 2 capsules by mouth daily.   hydrochlorothiazide (HYDRODIURIL) 12.5 MG tablet Take 12.5 mg by mouth every morning.   letrozole (FEMARA) 2.5 MG tablet Take 2.5 mg by mouth daily.   losartan (COZAAR) 50 MG tablet TAKE ONE TABLET BY MOUTH ONCE DAILY   lubiprostone (AMITIZA) 24 MCG capsule TAKE 1 CAPSULE BY MOUTH ONCE DAILY WITH  BREAKFAST.   magnesium oxide (MAG-OX) 400 (240 Mg) MG tablet TAKE ONE TABLET BY MOUTH EVERY DAY   melatonin 1 MG TABS tablet Take 3 mg by mouth at bedtime.   metoprolol succinate (TOPROL-XL) 25 MG 24 hr tablet Take 1 tablet (25 mg total) by mouth 2 (two) times daily.   mirabegron ER (MYRBETRIQ) 50 MG TB24 tablet Take 1 tablet (50 mg total) by mouth daily.   Multiple Vitamin (MULTIVITAMIN) tablet Take 1 tablet by mouth daily.   naloxone (NARCAN) nasal spray 4 mg/0.1 mL Place 1 spray into the nose as needed for up to 365 doses (for opioid-induced respiratory depresssion). In case of emergency (overdose), spray once into each nostril. If no response within 3 minutes, repeat application and call 911.   nitrofurantoin, macrocrystal-monohydrate, (MACROBID) 100 MG capsule Take 100 mg by mouth 2 (two) times daily.   Omega-3 1000 MG CAPS Take by mouth.   oxyCODONE (OXY IR/ROXICODONE) 5 MG immediate release tablet Take 1 tablet (5 mg total) by mouth every 6 (six) hours as needed for severe pain (pain score 7-10) or breakthrough pain (PRN for post-radiofrequency pain). Must last 30 days.   [START ON 03/13/2024] oxyCODONE (OXY IR/ROXICODONE) 5 MG immediate release tablet Take 1 tablet (5 mg total) by mouth every 6 (six) hours as needed for severe pain (pain score 7-10) or breakthrough pain (PRN for post-radiofrequency pain). Must last 30 days.   [START ON 04/12/2024] oxyCODONE (OXY IR/ROXICODONE) 5 MG immediate release tablet Take 1 tablet (  5 mg total) by mouth every 6 (six) hours as needed for severe pain (pain score 7-10) or breakthrough pain (PRN for post-radiofrequency pain). Must last 30 days.   pantoprazole (PROTONIX) 40 MG tablet TAKE ONE TABLET BY MOUTH EVERY DAY   rosuvastatin (CRESTOR) 20 MG tablet TAKE ONE TABLET BY MOUTH AT BEDTIME   No facility-administered encounter medications on file as of 02/18/2024.     Lab Results  Component Value Date   WBC 8.2 02/18/2024   HGB 11.7 (L) 02/18/2024   HCT  35.2 (L) 02/18/2024   PLT 239.0 02/18/2024   GLUCOSE 81 02/18/2024   CHOL 127 02/18/2024   TRIG 91.0 02/18/2024   HDL 49.30 02/18/2024   LDLDIRECT 73.0 07/09/2018   LDLCALC 59 02/18/2024   ALT 21 02/18/2024   AST 19 02/18/2024   NA 139 02/18/2024   K 4.1 02/18/2024   CL 104 02/18/2024   CREATININE 1.09 02/18/2024   BUN 28 (H) 02/18/2024   CO2 28 02/18/2024   TSH 2.86 09/11/2023   HGBA1C 5.7 02/18/2024    DG PAIN CLINIC C-ARM 1-60 MIN NO REPORT Result Date: 02/05/2024 Fluoro was used, but no Radiologist interpretation will be provided. Please refer to "NOTES" tab for provider progress note.      Assessment & Plan:  Hypercholesterolemia Assessment & Plan: Continue crestor. Follow lipid panel and liver function tests.   Orders: -     Hepatic function panel -     Lipid panel -     CBC with Differential/Platelet  Hyperglycemia Assessment & Plan: Low carb diet and exercise. Follow met b and A1c.   Orders: -     Hemoglobin A1c  Hypertension, unspecified type Assessment & Plan: Continues on metoprolol, amlodipine and losartan. Follow pressures. No changes in medication today. Check metabolic panel.   Orders: -     Basic metabolic panel  Preoperative clearance -     EKG 12-Lead  Pure hypercholesterolemia Assessment & Plan: Continue crestor.    PSVT (paroxysmal supraventricular tachycardia) (HCC) Assessment & Plan: History of paroxysmal SVT. ECHO 06/2023 - EF 55069%. Continue toprol. EKG - SR/SB with non specific ST/T changes.    Anemia, unspecified type Assessment & Plan: Hematology 04/2023 - retacrit.  F/u. Retacrit if her hemoglobin remains below 10.0.  follow cbc. Recheck cbc today    Aortic atherosclerosis (HCC) Assessment & Plan: Continue crestor.    Chronic kidney disease, stage 3a (HCC) Assessment & Plan: Avoid antiinflammatory medications. Stay hydrated. Check metabolic panel today.    Depression, major, single episode, mild (HCC) Assessment  & Plan: Continue citalopram. Overall appears to be handling things well. Follow.    Gastroesophageal reflux disease, unspecified whether esophagitis present Assessment & Plan: Continues on protonix. 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  Patient completed 5 years of letrozole in May 2019.  No further interventions are needed.  Patient has had bilateral mastectomies, therefore no further mammograms are necessary.    Osteoporosis without current pathological fracture, unspecified osteoporosis type Assessment & Plan: Zometa - per Dr Orlie Dakin.    Open wound of lower extremity, unspecified laterality, initial encounter Assessment & Plan: Left leg wound as outlined. Surrounding erythema. Discussed the need for this to be treated prior to surgery. Cleaned and dressing applied. Doxycycline as directed. Discussed wound care.    Pre-op evaluation Assessment &  Plan: Followed by pain clinic. Planning procedure requiring general anesthesia. No chest pain or increased sob reported. EKG as outlined. No acute change. Is followed by cardiology. Discussed f/u with cardiology regarding pre op evaluation. Also, discussed need to address current wound/infection - prior to procedure.    Other orders -     Doxycycline Hyclate; Take 1 tablet (100 mg total) by mouth 2 (two) times daily.  Dispense: 14 tablet; Refill: 0     [place], MD

## 2024-02-21 ENCOUNTER — Telehealth: Payer: Self-pay | Admitting: Pain Medicine

## 2024-02-21 NOTE — Telephone Encounter (Signed)
 PT called stated that she is in a lot of pain . Left hip. Wants to know if she can get a hip injection on next Tuesday instead of the shoulder.

## 2024-02-24 ENCOUNTER — Encounter: Payer: Self-pay | Admitting: Internal Medicine

## 2024-02-24 ENCOUNTER — Telehealth: Payer: Self-pay | Admitting: Internal Medicine

## 2024-02-24 DIAGNOSIS — S81809A Unspecified open wound, unspecified lower leg, initial encounter: Secondary | ICD-10-CM | POA: Insufficient documentation

## 2024-02-24 NOTE — Assessment & Plan Note (Signed)
 Followed by pain clinic. Planning procedure requiring general anesthesia. No chest pain or increased sob reported. EKG as outlined. No acute change. Is followed by cardiology. Discussed f/u with cardiology regarding pre op evaluation. Also, discussed need to address current wound/infection - prior to procedure.

## 2024-02-24 NOTE — Assessment & Plan Note (Signed)
 History of paroxysmal SVT. ECHO 06/2023 - EF 55069%. Continue toprol. EKG - SR/SB with non specific ST/T changes.

## 2024-02-24 NOTE — Telephone Encounter (Signed)
 Wanda Martinez, see me before calling. Please call cardiology (she sees Dr Azucena Cecil). Please schedule pre op evaluation. Pain clinic procedure requiring general anesthesia. Also, please call pt for update on wound.  Thanks.

## 2024-02-24 NOTE — Assessment & Plan Note (Signed)
 Continue crestor. Follow lipid panel and liver function tests.

## 2024-02-24 NOTE — Assessment & Plan Note (Signed)
 Continue crestor

## 2024-02-24 NOTE — Assessment & Plan Note (Signed)
 Avoid antiinflammatory medications. Stay hydrated. Check metabolic panel today.

## 2024-02-24 NOTE — Assessment & Plan Note (Signed)
 Hematology 04/2023 - retacrit.  F/u. Retacrit if her hemoglobin remains below 10.0.  follow cbc. Recheck cbc today

## 2024-02-24 NOTE — Assessment & Plan Note (Signed)
Continue citalopram.  Overall appears to be handling things well.  Follow.   

## 2024-02-24 NOTE — Assessment & Plan Note (Signed)
 Low-carb diet and exercise.  Follow met b and A1c.

## 2024-02-24 NOTE — Assessment & Plan Note (Signed)
 Zometa - per Dr Orlie Dakin.

## 2024-02-24 NOTE — Assessment & Plan Note (Signed)
 Continues on protonix. Acid reflux controlle.d

## 2024-02-24 NOTE — Assessment & Plan Note (Signed)
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  Patient completed 5 years of letrozole in May 2019.  No further interventions are needed.  Patient has had bilateral mastectomies, therefore no further mammograms are necessary.  ?

## 2024-02-24 NOTE — Assessment & Plan Note (Signed)
 Continues on metoprolol, amlodipine and losartan. Follow pressures. No changes in medication today. Check metabolic panel.

## 2024-02-24 NOTE — Assessment & Plan Note (Signed)
 Leg wound as outlined. Surrounding erythema. Discussed the need for this to be treated prior to surgery. Cleaned and dressing applied. Doxycycline as directed. Discussed wound care.

## 2024-02-25 ENCOUNTER — Telehealth: Payer: Self-pay

## 2024-02-25 MED ORDER — DOXYCYCLINE HYCLATE 100 MG PO TABS: 100.0000 mg | ORAL_TABLET | Freq: Two times a day (BID) | ORAL | 0 refills | Status: DC

## 2024-02-25 NOTE — Telephone Encounter (Signed)
 Called patient. She has been scheduled with cardiology 03/26/24 at 8:50 AM. She reports that her leg is doing ok. She says it did improve some with the abx but is not 100% better. Discussed with her about wound referral but patient is requesting to try one more round of abx and see if it will finish healing. She denies any fever, swelling, etc.

## 2024-02-25 NOTE — Addendum Note (Signed)
 Addended by: Charm Barges on: 02/25/2024 03:28 PM   Modules accepted: Orders

## 2024-02-25 NOTE — Telephone Encounter (Signed)
 Copied from CRM [id number]. Topic: Clinical - Medical Advice >> Feb 25, 2024  1:33 PM Almira Coaster wrote: Reason for CRM: Patient is scheduled for steroid injections tomorrow at the pain clinic; however, she is still feeling pain in her leg and she would like to know if Dr.Scott recommends going through with the injections.

## 2024-02-25 NOTE — Telephone Encounter (Signed)
 Spoke to Ms Vonseggern. Leg wound is better, but not completely healed. She request to extend abx and hold on f/u with wound clinic. Rx sent in for doxycycine. She will need a f/u appt with me to evaluate her leg wound.

## 2024-02-25 NOTE — Telephone Encounter (Signed)
 If better, but just not resolved, ok to extend out abx (doxycycline 100mg  bid) x one more week. Needs a f/u appt scheduled with me so that I can assess the wound.

## 2024-02-25 NOTE — Telephone Encounter (Signed)
 Called and spoke to [name]. She has already cancelled her appt for injections tomorrow.

## 2024-02-25 NOTE — Telephone Encounter (Signed)
 Pt scheduled for 03/06/24  at 11:30am

## 2024-02-26 ENCOUNTER — Ambulatory Visit: Payer: Medicare Other | Admitting: Pain Medicine

## 2024-02-27 ENCOUNTER — Telehealth: Payer: Self-pay | Admitting: Pain Medicine

## 2024-02-27 NOTE — Telephone Encounter (Signed)
 PT called stated that she is in a lot of pain on her left side. PT asked if she can take more of her pain medication until she can get something done. Please give patient a call. TY

## 2024-02-28 ENCOUNTER — Telehealth: Payer: Self-pay | Admitting: Pain Medicine

## 2024-02-28 DIAGNOSIS — I10 Essential (primary) hypertension: Secondary | ICD-10-CM | POA: Diagnosis not present

## 2024-02-28 DIAGNOSIS — G20A1 Parkinson's disease without dyskinesia, without mention of fluctuations: Secondary | ICD-10-CM | POA: Diagnosis not present

## 2024-02-28 DIAGNOSIS — Z79891 Long term (current) use of opiate analgesic: Secondary | ICD-10-CM | POA: Diagnosis not present

## 2024-02-28 DIAGNOSIS — Z888 Allergy status to other drugs, medicaments and biological substances status: Secondary | ICD-10-CM | POA: Diagnosis not present

## 2024-02-28 DIAGNOSIS — M25572 Pain in left ankle and joints of left foot: Secondary | ICD-10-CM | POA: Diagnosis not present

## 2024-02-28 DIAGNOSIS — M25552 Pain in left hip: Secondary | ICD-10-CM | POA: Diagnosis not present

## 2024-02-28 DIAGNOSIS — E785 Hyperlipidemia, unspecified: Secondary | ICD-10-CM | POA: Diagnosis not present

## 2024-02-28 DIAGNOSIS — Z7401 Bed confinement status: Secondary | ICD-10-CM | POA: Diagnosis not present

## 2024-02-28 DIAGNOSIS — M47816 Spondylosis without myelopathy or radiculopathy, lumbar region: Secondary | ICD-10-CM | POA: Diagnosis not present

## 2024-02-28 DIAGNOSIS — M79652 Pain in left thigh: Secondary | ICD-10-CM | POA: Diagnosis not present

## 2024-02-28 DIAGNOSIS — N189 Chronic kidney disease, unspecified: Secondary | ICD-10-CM | POA: Diagnosis not present

## 2024-02-28 DIAGNOSIS — Z7982 Long term (current) use of aspirin: Secondary | ICD-10-CM | POA: Diagnosis not present

## 2024-02-28 DIAGNOSIS — Z882 Allergy status to sulfonamides status: Secondary | ICD-10-CM | POA: Diagnosis not present

## 2024-02-28 DIAGNOSIS — G8929 Other chronic pain: Secondary | ICD-10-CM | POA: Diagnosis not present

## 2024-02-28 DIAGNOSIS — K219 Gastro-esophageal reflux disease without esophagitis: Secondary | ICD-10-CM | POA: Diagnosis not present

## 2024-02-28 DIAGNOSIS — M545 Low back pain, unspecified: Secondary | ICD-10-CM | POA: Diagnosis not present

## 2024-02-28 DIAGNOSIS — Z66 Do not resuscitate: Secondary | ICD-10-CM | POA: Diagnosis not present

## 2024-02-28 DIAGNOSIS — I129 Hypertensive chronic kidney disease with stage 1 through stage 4 chronic kidney disease, or unspecified chronic kidney disease: Secondary | ICD-10-CM | POA: Diagnosis not present

## 2024-02-28 DIAGNOSIS — M25551 Pain in right hip: Secondary | ICD-10-CM | POA: Diagnosis not present

## 2024-02-28 DIAGNOSIS — Z9013 Acquired absence of bilateral breasts and nipples: Secondary | ICD-10-CM | POA: Diagnosis not present

## 2024-02-28 NOTE — Telephone Encounter (Signed)
Please refer to previous phone note.

## 2024-02-28 NOTE — Telephone Encounter (Signed)
 Patient is having a lot of pain in her right hip and back. Says current meds are not helping. Please call to advise

## 2024-02-28 NOTE — Telephone Encounter (Signed)
 Returned patient phone call. Patient states she is having pain in lower right back into the right hip bone. She states she is in severe pain and unable to get out of bed. She is requesting for Dr. Shauna Hugh to do a procedure of "give her something for pain".   Patient has current cellulitis of the leg and taking ABX currently. She states she has 8 more days to go on the ABX. Informed patient that Dr. Shauna Hugh can not do a procedure on her right now because the steroids of the procedure can delay the healing process of the cellulitis.   Patient states she is taking her pain medication every 6 hours and states that "pain medication is not touching the pain". I informed her that she is taking the max amount of pain medication and that Dr. Laban Emperor will not prescribe her to take more as there are risks with that.   Informed patient that at the moment there is nothing we can do but that this message will be passed on to him and she will be given an phone call later today with an update from Dr. Shauna Hugh.   Encourage patient that if she truly is in severe pain, that she should go to the Emergency Dept or call 911. Patient states she does not want to do that as "I am in so much pain that I can not move or want to go there".   Patient verbalized understanding.

## 2024-03-03 ENCOUNTER — Telehealth: Payer: Self-pay | Admitting: Pain Medicine

## 2024-03-03 ENCOUNTER — Other Ambulatory Visit: Payer: Self-pay | Admitting: Pain Medicine

## 2024-03-03 DIAGNOSIS — L0390 Cellulitis, unspecified: Secondary | ICD-10-CM | POA: Insufficient documentation

## 2024-03-03 NOTE — Telephone Encounter (Signed)
 Dr. Shauna Hugh messaged following secure chat:   "No pain patch if she is referring to fentanyl. If it's any kind of patch, then I would recommend the OTC stuff they have in the pharmacy. No scripts needed'.   Spoke with Dr. Shauna Hugh and he wants her to come in tomorrow for possible left hip injection pending status of cellulitis. Dr. Shauna Hugh is allowing her to take her current pain medication, Oxycodone 5mg , every 4 hours starting today instead of every 6 hours (enough to get her through tomorrow for a possible procedure).   Spoke with patient on the phone and informed her of above message. She states she will try her very best to make it tomorrow. Patient verbalized understanding and questions answered.   Will message scheduling staff for patient to be seen tomorrow for procedure.

## 2024-03-03 NOTE — Telephone Encounter (Signed)
 Returned call to patient. Patient states her pain is severe in the left hip states that pain does not radiate. She states she went to the emergency dept on 02/28/24 and she said she felt better when she went home but pain returned the next day.   She is wanting a procedure on her left hip. She states that she completed ABX today and that her cellulitis feels better and that there are no open sores.   Patient states her pain medication does not last as long, that it seems to wear off after 4 hours max. Will informed Dr. Shauna Hugh and give patient an update.

## 2024-03-03 NOTE — Telephone Encounter (Signed)
 PT called stated that she is in severe pain to the point that when she walks she feels like she is going to pass out. PT stated that her hip is giving her so much pain. Wants to know if she can come in tomorrow get a injection in her hip. Please give patient a call. TY

## 2024-03-03 NOTE — Telephone Encounter (Signed)
 Informed Dr.Naveria of patient request and previous phone call.   Dr. Shauna Hugh is willing to increase the frequency of pain medication as patient states she feels like her pain medication worse off after 4 hours. Dr. Shauna Hugh also stated he would like her to come in for a CBC today to make sure WBC are back to normal following cellulitis infection. If CBC looks normal he will be okay for her to have a procedure for the hip pain soon as patient requested.    Patient states she can not come in for blood work as she is in so much pain. She states she would rather have Dr. Shauna Hugh control her pain with pain medication or pain patch so she can be more mobile and than she can come to the clinic. Sent Dr. Laban Emperor the message from the patient. Informed patient not to take more pain medication than prescribed and wait until I returned her call with recommendations from Dr. Laban Emperor. Patient verbalized understanding.

## 2024-03-04 ENCOUNTER — Telehealth: Payer: Self-pay

## 2024-03-04 ENCOUNTER — Ambulatory Visit (HOSPITAL_BASED_OUTPATIENT_CLINIC_OR_DEPARTMENT_OTHER): Admitting: Pain Medicine

## 2024-03-04 DIAGNOSIS — Z5189 Encounter for other specified aftercare: Secondary | ICD-10-CM

## 2024-03-04 DIAGNOSIS — M160 Bilateral primary osteoarthritis of hip: Secondary | ICD-10-CM | POA: Insufficient documentation

## 2024-03-04 DIAGNOSIS — G8929 Other chronic pain: Secondary | ICD-10-CM | POA: Insufficient documentation

## 2024-03-04 DIAGNOSIS — Z91199 Patient's noncompliance with other medical treatment and regimen due to unspecified reason: Secondary | ICD-10-CM

## 2024-03-04 DIAGNOSIS — M25552 Pain in left hip: Secondary | ICD-10-CM

## 2024-03-04 NOTE — Telephone Encounter (Signed)
 Copied from CRM [id number]. Topic: Referral - Question >> Mar 04, 2024 10:31 AM Wanda Martinez wrote: Reason for CRM: Patients son Wanda Martinez would like to know if his mom could have orders placed for her to have palliative care,for a nurse to be able to come to the home and help out with her,with mobility and meals,etc. He stated that it is impossible for her to use her mobility ,and is pretty much in the bed all of the time.

## 2024-03-04 NOTE — Progress Notes (Signed)
 (03/04/2024) Patient called yesterday multiple times wanting to come in today for a left hip injection. Today she calls to cancel indicating that she is in too much pain and having nausea and that she will be going to the ED instead.  Note: The patient was last seen on 02/11/2024 at which time we had a conversation with her regarding the issue of tediously jumping from one area treatment to a different area where we were having difficulty benefit since she kept derailing the treatment plans.  On 10/16/2023 we had done a diagnostic right suprascapular nerve block to help her with her chronic recurrent right shoulder pain.  This block proved to be very effective in controlling her pain and therefore we decided to move onto radiofrequency ablation for the purpose of extending the benefits.  Before we could get her approved for that right sided suprascapular nerve RFA the patient contacted office indicating that she was having a flareup of her low back pain and wanted to switch gears to treating that area.  On 11/01/2023 a therapeutic/palliative bilateral lumbar facet block was done with good results suggesting that her prior lumbar facet radiofrequency's had worn off and needed to be repeated.  Before we could get this set up, she called and indicated he was having a flareup of her right knee pain wanted [place] to switch our attention to injecting her right knee which was done on 11/13/2023.  The intra-articular injection of Zilretta provided her with good relief of the pain within the knee joint, though she continued to have pain in the medial aspect of the knee in the area of the Pes anserine bursa which she then had injected on 12/06/2023.  Before we could on to the suprascapular RFA or secure approval for the lumbar facet RFA, she called indicating that she was having another bad flareup of the low back pain and on 01/15/2024 came in for a bilateral lumbar facet block.  As we get that pain under control we went ahead  and schedule her for her right suprascapular RFA to be done on 01/31/2024 however, on the day of the procedure she called and canceled indicating that she was feeling very weak and could not get up to come in for treatment.  Before we could get that rescheduled she called indicating that she was having a flareup of her low back pain and on 02/05/2024 she again underwent a palliative bilateral facet block.  On follow-up we once more attempted to schedule the radiofrequency of the shoulder, but now she has called indicating that she is having a flareup of her pain involving her left hip and she wants to come in to have that treated.  Unfortunately she was recently diagnosed with a lower extremity cellulitis and started on antibiotics which prevented [place] from bringing her in to do any type of injections.  Yesterday she called several times indicating that she wanted to go ahead with the treatment of her left hip and that she had stopped her antibiotics.  It was not very clear to [place] if this had been done on her own and attempt to get the procedure done or if it was discontinued by the prescribing physician due to the cellulitis being resolved.  Yesterday I asked her to come in to have a CBC done so that we could determine if she was still having an elevated white blood cell count, but she indicated that she was in too much pain to come in to have the lab work  done.  This is another problem that we are experiencing with her in the sense that she does not want to come in for the evaluations, she only wants to come in for that treatments and it makes it very difficult for [place] to have an adequate plan of treatment in place since she tends to self diagnose her areas of problems and because of her nonmedical background she is often inaccurate.  Today this appointment is set up because she has indicated that she is having pain in her left hip and needs to have an injection in that area however, in the past she has reported  similar "hip pain" which indications has been accurately secondary to the hip joint, but just as often it has been pain over the PSIS and posterolateral area below the iliac crest which she has referred to it as her hip but falls more under the category of the lower back.  Because of this reason we have often at the patient scheduled for one particular procedure which ends up being changed once she arrives to the clinic for [place] to evaluate.  Primary Care Physician: Dale Talihina, MD Location: [facility] Note by: Oswaldo Done, MD  Date: 03/04/2024; Time: 11:43 AM

## 2024-03-04 NOTE — Transitions of Care (Post Inpatient/ED Visit) (Signed)
 03/04/2024  Name: Wanda Martinez MRN: [id number] DOB: [birth year]  Today's TOC FU Call Status: Today's TOC FU Call Status:: Successful TOC FU Call Completed TOC FU Call Complete Date: 03/04/24 Patient's Name and Date of Birth confirmed.  Transition Care Management Follow-up Telephone Call Date of Discharge: 03/03/24 Discharge Facility: Other [facility]) Name of Other (Non[facility]) Discharge Facility: [facility] Type of Discharge: Emergency Department Reason for ED Visit: Other: (low back pain) How have you been since you were released from the hospital?: Same Any questions or concerns?: Yes Patient Questions/Concerns:: alot of pain  Items Reviewed: Did you receive and understand the discharge instructions provided?: Yes Medications obtained,verified, and reconciled?: Yes (Medications Reviewed) Any new allergies since your discharge?: No Dietary orders reviewed?: Yes Do you have support at home?: Yes People in Home: spouse  Medications Reviewed Today: Medications Reviewed Today     Reviewed by Wanda Addison, LPN (Licensed Practical Nurse) on 03/04/24 at 1056  Med List Status: <None>   Medication Order Taking? Sig Documenting Provider Last Dose Status Informant  amLODipine (NORVASC) 2.5 MG tablet [id number] No Take 1 tablet (2.5 mg total) by mouth daily. Wanda [place], Martinez Taking Active   aspirin 81 MG EC tablet [id number] No Take 81 mg by mouth daily as needed. Wanda Martinez Taking Active   calcium carbonate (OSCAL) 1500 (600 Ca) MG TABS tablet [id number] No Take 1 tablet by mouth daily. Wanda Martinez Taking Active   citalopram (CELEXA) 10 MG tablet [id number] No Take 10 mg by mouth daily. Wanda Martinez Taking Active   citalopram (CELEXA) 20 MG tablet [id number] No TAKE 1 TABLET BY MOUTH ONCE DAILY. Wanda [place], Martinez Taking Active   cyclobenzaprine (FLEXERIL) 10 MG tablet [id number] No TAKE ONE TABLET BY MOUTH AT BEDTIME. Wanda [place],  Martinez Taking Active   donepezil (ARICEPT) 10 MG tablet [id number] No TAKE ONE TABLET BY MOUTH AT BEDTIME Wanda [place], Martinez Taking Active   doxycycline (VIBRA-TABS) 100 MG tablet [id number]  Take 1 tablet (100 mg total) by mouth 2 (two) times daily. Wanda [place], Martinez  Active   gabapentin (NEURONTIN) 300 MG capsule [id number] No Take 1 capsule (300 mg total) by mouth 2 (two) times daily. Wanda [place], Martinez Taking Active   Glucosamine-Chondroitin 500-400 MG CAPS [id number] No Take 2 capsules by mouth daily. Wanda Martinez Taking Active   hydrochlorothiazide (HYDRODIURIL) 12.5 MG tablet [id number] No Take 12.5 mg by mouth every morning. Wanda Martinez Taking Active   letrozole [facility]) 2.5 MG tablet [id number] No Take 2.5 mg by mouth daily. Wanda Martinez Taking Active   losartan (COZAAR) 50 MG tablet [id number] No TAKE ONE TABLET BY MOUTH ONCE DAILY Wanda [place], Martinez Taking Active   lubiprostone (AMITIZA) 24 MCG capsule [id number] No TAKE 1 CAPSULE BY MOUTH ONCE DAILY WITH BREAKFAST. Wanda Martinez Taking Active            Med Note (GARNER, Carmon Sails   Tue Jan 16, 2017 11:45 AM)    magnesium oxide (MAG-OX) 400 (240 Mg) MG tablet [id number] No TAKE ONE TABLET BY MOUTH EVERY DAY Wanda [place], Martinez Taking Active   melatonin 1 MG TABS tablet [id number] No Take 3 mg by mouth at bedtime. Wanda Martinez Taking Active   metoprolol succinate (TOPROL-XL) 25 MG 24 hr tablet [id number] No Take 1 tablet (25 mg total) by mouth 2 (two) times daily. Wanda Martinez Taking Active   mirabegron ER [facility]) 50 MG TB24 tablet [id number] No Take  1 tablet (50 mg total) by mouth daily. Wanda [place], Martinez Taking Active   Multiple Vitamin (MULTIVITAMIN) tablet [id number] No Take 1 tablet by mouth daily. Wanda Martinez Taking Active   naloxone [facility]) nasal spray 4 mg/0.1 mL [id number] No Place 1 spray into the nose as needed for up to 365 doses (for opioid-induced  respiratory depresssion). In case of emergency (overdose), spray once into each nostril. If no response within 3 minutes, repeat application and call 911. Wanda [place], Martinez Taking Active            Med Note Wanda Martinez, Box Martinez   Mon Feb 11, 2024  3:06 PM) WARNING: Wanda mandated prescription under the "STOP Act of 2017" requiring reversal medication to be available to patient when taking opioid medication capable of inducing respiratory depression and failure. DO NOT REMOVE!  nitrofurantoin, macrocrystal-monohydrate, (MACROBID) 100 MG capsule [id number] No Take 100 mg by mouth 2 (two) times daily. Wanda Martinez Taking Active   Omega-3 1000 MG CAPS [id number] No Take by mouth. Wanda Martinez Taking Active   oxyCODONE (OXY IR/ROXICODONE) 5 MG immediate release tablet [id number] No Take 1 tablet (5 mg total) by mouth every 6 (six) hours as needed for severe pain (pain score 7-10) or breakthrough pain (PRN for post-radiofrequency pain). Must last 30 days. Wanda [place], Martinez Taking Active   oxyCODONE (OXY IR/ROXICODONE) 5 MG immediate release tablet [id number] No Take 1 tablet (5 mg total) by mouth every 6 (six) hours as needed for severe pain (pain score 7-10) or breakthrough pain (PRN for post-radiofrequency pain). Must last 30 days. Wanda [place], Martinez Taking Active   oxyCODONE (OXY IR/ROXICODONE) 5 MG immediate release tablet [id number] No Take 1 tablet (5 mg total) by mouth every 6 (six) hours as needed for severe pain (pain score 7-10) or breakthrough pain (PRN for post-radiofrequency pain). Must last 30 days. Wanda [place], Martinez Taking Active            Med Note Wanda Martinez, Wanda Martinez   Mon Feb 11, 2024  3:06 PM) WARNING: Future prescription, NOT Martinez DUPLICATE. DO NOT DELETE. Do not delete during hospital medication reconciliation or at discharge. DO NOT LABEL as "Patient not taking".  pantoprazole (PROTONIX) 40 MG tablet [id number] No TAKE ONE TABLET BY MOUTH EVERY DAY  Wanda [place], Martinez Taking Active   rosuvastatin (CRESTOR) 20 MG tablet [id number] No TAKE ONE TABLET BY MOUTH AT BEDTIME Wanda [place], Martinez Taking Active   Med List Note Newman [name], RN 02/11/24 1511): UDS 06/27/23 MR 05/12/24 Medication agreement renewed 2020            Home Care and Equipment/Supplies: Were Home Health Services Ordered?: NA Any new equipment or medical supplies ordered?: NA  Functional Questionnaire: Do you need assistance with bathing/showering or dressing?: Yes Do you need assistance with meal preparation?: Yes Do you need assistance with eating?: No Do you have difficulty maintaining continence: No Do you need assistance with getting out of bed/getting out of Martinez chair/moving?: Yes Do you have difficulty managing or taking your medications?: No  Follow up appointments reviewed: PCP Follow-up appointment confirmed?: Yes Date of PCP follow-up appointment?: 03/06/24 Follow-up Provider: [facility] Follow-up appointment confirmed?: NA (unable to ride to appt) Do you need transportation to your follow-up appointment?: No Do you understand care options if your condition(s) worsen?: Yes-patient verbalized understanding    SIGNATURE Wanda Addison, LPN [facility] Nurse Health Advisor Direct Dial [phone]

## 2024-03-05 ENCOUNTER — Telehealth: Payer: Self-pay | Admitting: Pain Medicine

## 2024-03-05 ENCOUNTER — Ambulatory Visit: Payer: Self-pay | Admitting: Internal Medicine

## 2024-03-05 NOTE — Telephone Encounter (Signed)
 LMTCB

## 2024-03-05 NOTE — Telephone Encounter (Signed)
 Would you be ok with palliative care referral or would you like to discuss with her at appt tomorrow?

## 2024-03-05 NOTE — Telephone Encounter (Signed)
 I am ok with placing order for referral, but is pt aware. Can d/w her at her appt if needed.

## 2024-03-05 NOTE — Telephone Encounter (Signed)
 I called [facility], spoke with the director, gave him Wanda Martinez's information. He will call her and call us back.

## 2024-03-05 NOTE — Telephone Encounter (Signed)
 PT stated that she is in so pain the only way she will be able to come in is if she gets the ambulance to bring her. PT wants to know will that be okay with [place]. Please give patient a call. TY

## 2024-03-05 NOTE — Telephone Encounter (Signed)
 Will discuss at appt tomorrow

## 2024-03-05 NOTE — Telephone Encounter (Signed)
 Patient called in very hard of hearing and unable to assess fully verbally. Patient is asking for message to be passed to Dr. Lorin Picket, asking if she can have 5 more days of antibiotics called in. Patient states she is trying to arrange a surgery for her hip and needs to ensure her leg is fully healed before her surgery. Please contact patient back asap in regard to if further antibiotic can be called in.  Copied from CRM [id number]. Topic: Clinical - Red Word Triage >> Mar 05, 2024  4:50 PM Taleah C wrote: Red Word that prompted transfer to Nurse Triage: pt called to request if Dr. Lorin Picket could send her 5 more days of antibiotic. She stated that she is still having extreme leg pain. Cannot move out of her bed. Pain scale 10/10. Reason for Disposition  [1] Follow-up call from patient regarding patient's clinical status AND [2] information NON-URGENT    Please see notes.  Answer Assessment - Initial Assessment Questions 1. REASON FOR CALL or QUESTION: "What is your reason for calling today?" or "How can I best help you?" or "What question do you have that I can help answer?"     Patient would like call back from nurse of Dr. Dale Meservey to determine if Dr. Lorin Picket will call in 5 more days of antibiotics. Please see notes. 2. CALLER: Document the source of call. (e.g., laboratory, patient).     Patient  Protocols used: PCP Call - No Triage-A-AH

## 2024-03-06 ENCOUNTER — Telehealth: Payer: Self-pay

## 2024-03-06 ENCOUNTER — Ambulatory Visit: Admitting: Internal Medicine

## 2024-03-06 ENCOUNTER — Telehealth: Payer: Self-pay | Admitting: *Deleted

## 2024-03-06 NOTE — Telephone Encounter (Signed)
 Reviewed. Per previous message, her son had requested a referral to palliative care. Is she agreeable, if so, can place order.  Also, please confirm what her leg looks like now. She was requesting more abx, but this message states the leg lesion is ok. If any persistent concerns regarding possible infection, I can extend out a few days, but need to follow closely.

## 2024-03-06 NOTE — Telephone Encounter (Signed)
 Called patient to try to add her back to the schedule for her leg to be looked at. Patient says she can't. She is in too much pain. Patient says all she can do is lay in the bed. Pt stated "the only way I'm going anywhere is in an ambulance." Advised pt that if pain level is that severe she does need to go to the ED for further evaluation. Pt declined and said all they would do was treat the pain. Advised that this may be what is needed at this time. Also explained that we cannot extend out abx for her leg without a wound check and in order for her to be cleared for pain stimulator she has to have cardiac clearance and wound has to be healed. Pt stated the area is healed but still tender. Offered patient a virtual visit and also offered to move her appointment to the beginning of next week. Patient declined both options. Advised again if severe pain, go to ED.

## 2024-03-06 NOTE — Telephone Encounter (Signed)
 Copied from CRM [id number]. Topic: Referral - Status >> Mar 06, 2024  1:45 PM Isabell A wrote: Reason for CRM: Son, Lorin Picket checking of status of referral for palliative care  - appointment today was canceled due to patient not being in any condition to come in .   Callback number: [phone]

## 2024-03-07 ENCOUNTER — Other Ambulatory Visit: Payer: Self-pay | Admitting: Internal Medicine

## 2024-03-07 DIAGNOSIS — M545 Low back pain, unspecified: Secondary | ICD-10-CM

## 2024-03-07 DIAGNOSIS — N1831 Chronic kidney disease, stage 3a: Secondary | ICD-10-CM

## 2024-03-07 DIAGNOSIS — N1832 Chronic kidney disease, stage 3b: Secondary | ICD-10-CM

## 2024-03-07 DIAGNOSIS — Z853 Personal history of malignant neoplasm of breast: Secondary | ICD-10-CM

## 2024-03-07 NOTE — Telephone Encounter (Signed)
 I have placed the order for palliative care. If there is no infection, will hold on more abx. Keep [place] posted on how she is doing.

## 2024-03-07 NOTE — Telephone Encounter (Signed)
 Patient says her leg has healed up pretty good. Tender to the touch. No redness. There is a scab that is about 3/4in long and "not wide at all." Husband reports it is not open and the area around the scab does not look infected. Patient says it does not bother her unless someone is touching. No swelling in the leg. Advised we may not need to extend abx.  Patient is agreeable to palliative care referral.

## 2024-03-07 NOTE — Telephone Encounter (Signed)
 Called Marylene Land again, mailbox full.  Called [facility], message left at Director's number, requesting that they call me back.

## 2024-03-07 NOTE — Telephone Encounter (Signed)
 Called and discussed with son. See other note.

## 2024-03-07 NOTE — Progress Notes (Signed)
Order placed for palliative care referral.  

## 2024-03-07 NOTE — Telephone Encounter (Signed)
 Attempted to call Marylene Land, mailbox full, unable to leave a message.

## 2024-03-07 NOTE — Telephone Encounter (Signed)
 Patient aware of below.

## 2024-03-10 ENCOUNTER — Telehealth: Payer: Self-pay | Admitting: Pain Medicine

## 2024-03-10 ENCOUNTER — Telehealth: Payer: Self-pay

## 2024-03-10 NOTE — Telephone Encounter (Signed)
 Called Wanda Martinez again, voice mailbox full. Called Catering manager at [employer], message left.

## 2024-03-10 NOTE — Telephone Encounter (Signed)
 FYI- spoke with Cordelia Pen, nothing needed at this time. She was just letting us know that referral was received

## 2024-03-10 NOTE — Telephone Encounter (Signed)
 Patient states she is in so much pain. Wants to know if Dr Laban Emperor will consider putting in pain pump.

## 2024-03-10 NOTE — Telephone Encounter (Signed)
 Copied from CRM [id number]. Topic: General - Other >> Mar 10, 2024 10:07 AM Fredrich Romans wrote: Reason for CRM: Cordelia Pen for [facility] called stating that they received order for palliative care and they will be following patient. Cb#:[phone]

## 2024-03-11 ENCOUNTER — Ambulatory Visit: Payer: Self-pay

## 2024-03-11 ENCOUNTER — Telehealth: Payer: Self-pay

## 2024-03-11 NOTE — Telephone Encounter (Signed)
 FYI patient is on her way to the ED.

## 2024-03-11 NOTE — Telephone Encounter (Signed)
 Patient left a vm today saying no one called her back yesterday

## 2024-03-11 NOTE — Telephone Encounter (Signed)
 I spoke with Marylene Land, she will arrange ambulance transportation. She will contact patient.

## 2024-03-11 NOTE — Telephone Encounter (Signed)
 Attempted to call patient to discuss her request for a pain pump.  Several visits ago Dr Laban Emperor requested clearance from her PCP to have IT pump trial.  Patient has been unable to get t he clearance from PCP until she sees the cardiologist, and has not been able to get to the cardiologist.  There was no answer so I was unable to explain this to the patient.

## 2024-03-11 NOTE — Telephone Encounter (Signed)
  Chief Complaint: pain Symptoms: severe left sided body pain Frequency: x 3 weeks Pertinent Negatives: Patient denies injury, chest pain, SOB, fever Disposition: [x] ED /[] Urgent Care (no appt availability in office) / [] Appointment(In office/virtual)/ []  [place] [facility]/ [] Home Care/ [] Refused Recommended Disposition /[] [place] [facility]/ []  Follow-up with PCP Additional Notes: Patient calling in c/o left sided body pain 10/10 severe x 3 weeks. She was calling in stating she will call an ambulance and go to the ED in [place] but would like Dr Lorin Picket to request she be admitted. She states she has been taking oxycodone, last dose was this morning and that makes the pain barely tolerable. Patient denies any additional symptoms. Patient refused acute visit today and states she feels like she needs to go to the ED and be admitted. Patient states she will call 911 for am ambulance.  Copied from CRM [id number]. Topic: Clinical - Medical Advice >> Mar 11, 2024  1:48 PM Isabelle Course C wrote: Reason for CRM: Patient has been in bed in extreme pain for 3 weeks now. Needs to be admitted into hospital asap Reason for Disposition  [1] SEVERE pain (e.g., excruciating, unable to do any normal activities) AND [2] not improved 2 hours after pain medicine  Answer Assessment - Initial Assessment Questions 1. ONSET: "When did the muscle aches or body pains start?"      X 3 weeks.  2. LOCATION: "What part of your body is hurting?" (e.g., entire body, arms, legs)      Left sided body pain.  3. SEVERITY: "How bad is the pain?" (Scale 1-10; or mild, moderate, severe)   - MILD (1-3): doesn't interfere with normal activities    - MODERATE (4-7): interferes with normal activities or awakens from sleep    - SEVERE (8-10):  excruciating pain, unable to do any normal activities      10/10.  4. CAUSE: "What do you think is causing the pains?"     Unsure.  5. FEVER: "Have you been having fever?"      Denies.  6. OTHER SYMPTOMS: "Do you have any other symptoms?" (e.g., chest pain, weakness, rash, cold or flu symptoms, weight loss)     Denies.  7. PREGNANCY: "Is there any chance you are pregnant?" "When was your last menstrual period?"     N/A.  8. TRAVEL: "Have you traveled out of the country in the last month?" (e.g., travel history, exposures)     Denies.  Protocols used: Muscle Aches and Body Pain-A-AH

## 2024-03-11 NOTE — Telephone Encounter (Signed)
 Copied from CRM [id number]. Topic: Clinical - Medical Advice >> Mar 11, 2024  3:06 PM Florestine Avers wrote: Reason for CRM: Patients son wants to know when a nurse will come to her house for her Palliative Care assessment. Patients son wants to know the update on when she will be receiving the Palliative care. His best call back is going to be [phone].

## 2024-03-12 ENCOUNTER — Inpatient Hospital Stay
Admission: AD | Admit: 2024-03-12 | Discharge: 2024-03-16 | DRG: 552 | Disposition: A | Discharge status: Home Health | Attending: Internal Medicine | Admitting: Internal Medicine

## 2024-03-12 ENCOUNTER — Other Ambulatory Visit: Payer: Self-pay

## 2024-03-12 ENCOUNTER — Encounter: Payer: Self-pay | Admitting: Intensive Care

## 2024-03-12 ENCOUNTER — Emergency Department

## 2024-03-12 ENCOUNTER — Ambulatory Visit: Payer: Self-pay

## 2024-03-12 DIAGNOSIS — M461 Sacroiliitis, not elsewhere classified: Secondary | ICD-10-CM | POA: Diagnosis not present

## 2024-03-12 DIAGNOSIS — D638 Anemia in other chronic diseases classified elsewhere: Secondary | ICD-10-CM | POA: Diagnosis present

## 2024-03-12 DIAGNOSIS — M549 Dorsalgia, unspecified: Secondary | ICD-10-CM | POA: Diagnosis not present

## 2024-03-12 DIAGNOSIS — E871 Hypo-osmolality and hyponatremia: Secondary | ICD-10-CM | POA: Diagnosis present

## 2024-03-12 DIAGNOSIS — G8929 Other chronic pain: Principal | ICD-10-CM | POA: Diagnosis present

## 2024-03-12 DIAGNOSIS — R262 Difficulty in walking, not elsewhere classified: Secondary | ICD-10-CM | POA: Diagnosis not present

## 2024-03-12 DIAGNOSIS — Z820 Family history of epilepsy and other diseases of the nervous system: Secondary | ICD-10-CM

## 2024-03-12 DIAGNOSIS — M25561 Pain in right knee: Secondary | ICD-10-CM | POA: Diagnosis present

## 2024-03-12 DIAGNOSIS — K5900 Constipation, unspecified: Secondary | ICD-10-CM | POA: Diagnosis not present

## 2024-03-12 DIAGNOSIS — Z9013 Acquired absence of bilateral breasts and nipples: Secondary | ICD-10-CM | POA: Diagnosis not present

## 2024-03-12 DIAGNOSIS — Z90710 Acquired absence of both cervix and uterus: Secondary | ICD-10-CM

## 2024-03-12 DIAGNOSIS — Z888 Allergy status to other drugs, medicaments and biological substances status: Secondary | ICD-10-CM | POA: Diagnosis not present

## 2024-03-12 DIAGNOSIS — Z8701 Personal history of pneumonia (recurrent): Secondary | ICD-10-CM

## 2024-03-12 DIAGNOSIS — M7062 Trochanteric bursitis, left hip: Secondary | ICD-10-CM | POA: Diagnosis not present

## 2024-03-12 DIAGNOSIS — Z7982 Long term (current) use of aspirin: Secondary | ICD-10-CM

## 2024-03-12 DIAGNOSIS — Z833 Family history of diabetes mellitus: Secondary | ICD-10-CM

## 2024-03-12 DIAGNOSIS — K4090 Unilateral inguinal hernia, without obstruction or gangrene, not specified as recurrent: Secondary | ICD-10-CM | POA: Diagnosis not present

## 2024-03-12 DIAGNOSIS — G20A1 Parkinson's disease without dyskinesia, without mention of fluctuations: Secondary | ICD-10-CM | POA: Diagnosis not present

## 2024-03-12 DIAGNOSIS — M1990 Unspecified osteoarthritis, unspecified site: Secondary | ICD-10-CM | POA: Diagnosis present

## 2024-03-12 DIAGNOSIS — M545 Low back pain, unspecified: Secondary | ICD-10-CM | POA: Diagnosis not present

## 2024-03-12 DIAGNOSIS — Z853 Personal history of malignant neoplasm of breast: Secondary | ICD-10-CM

## 2024-03-12 DIAGNOSIS — D649 Anemia, unspecified: Secondary | ICD-10-CM | POA: Diagnosis present

## 2024-03-12 DIAGNOSIS — M5459 Other low back pain: Secondary | ICD-10-CM | POA: Diagnosis not present

## 2024-03-12 DIAGNOSIS — K5730 Diverticulosis of large intestine without perforation or abscess without bleeding: Secondary | ICD-10-CM | POA: Diagnosis not present

## 2024-03-12 DIAGNOSIS — K219 Gastro-esophageal reflux disease without esophagitis: Secondary | ICD-10-CM | POA: Diagnosis not present

## 2024-03-12 DIAGNOSIS — Z79811 Long term (current) use of aromatase inhibitors: Secondary | ICD-10-CM | POA: Diagnosis not present

## 2024-03-12 DIAGNOSIS — M25511 Pain in right shoulder: Secondary | ICD-10-CM | POA: Diagnosis not present

## 2024-03-12 DIAGNOSIS — M25552 Pain in left hip: Secondary | ICD-10-CM | POA: Diagnosis not present

## 2024-03-12 DIAGNOSIS — K5909 Other constipation: Secondary | ICD-10-CM | POA: Diagnosis not present

## 2024-03-12 DIAGNOSIS — I10 Essential (primary) hypertension: Secondary | ICD-10-CM | POA: Diagnosis present

## 2024-03-12 DIAGNOSIS — Z882 Allergy status to sulfonamides status: Secondary | ICD-10-CM

## 2024-03-12 DIAGNOSIS — Z87440 Personal history of urinary (tract) infections: Secondary | ICD-10-CM

## 2024-03-12 DIAGNOSIS — Z79899 Other long term (current) drug therapy: Secondary | ICD-10-CM

## 2024-03-12 LAB — BASIC METABOLIC PANEL
Anion gap: 8 (ref 5–15)
BUN: 22 mg/dL (ref 8–23)
CO2: 24 mmol/L (ref 22–32)
Calcium: 8.9 mg/dL (ref 8.9–10.3)
Chloride: 100 mmol/L (ref 98–111)
Creatinine, Ser: 0.83 mg/dL (ref 0.44–1.00)
GFR, Estimated: >60 mL/min (ref >60)
Glucose, Bld: 113 mg/dL — ABNORMAL HIGH (ref 70–99)
Potassium: 3.5 mmol/L (ref 3.5–5.1)
Sodium: 132 mmol/L — ABNORMAL LOW (ref 135–145)

## 2024-03-12 LAB — CBC WITH DIFFERENTIAL/PLATELET
Abs Immature Granulocytes: 0.04 10*3/uL (ref 0.00–0.07)
Basophils Absolute: 0 10*3/uL (ref 0.0–0.1)
Basophils Relative: 1 %
Eosinophils Absolute: 0.1 10*3/uL (ref 0.0–0.5)
Eosinophils Relative: 2 %
HCT: 33.8 % — ABNORMAL LOW (ref 36.0–46.0)
Hemoglobin: 11.2 g/dL — ABNORMAL LOW (ref 12.0–15.0)
Immature Granulocytes: 1 %
Lymphocytes Relative: 23 %
Lymphs Abs: 1.5 10*3/uL (ref 0.7–4.0)
MCH: 31.2 pg (ref 26.0–34.0)
MCHC: 33.1 g/dL (ref 30.0–36.0)
MCV: 94.2 fL (ref 80.0–100.0)
Monocytes Absolute: 0.5 10*3/uL (ref 0.1–1.0)
Monocytes Relative: 8 %
Neutro Abs: 4.4 10*3/uL (ref 1.7–7.7)
Neutrophils Relative %: 65 %
Platelets: 226 10*3/uL (ref 150–400)
RBC: 3.59 MIL/uL — ABNORMAL LOW (ref 3.87–5.11)
RDW: 13.5 % (ref 11.5–15.5)
WBC: 6.6 10*3/uL (ref 4.0–10.5)
nRBC: 0 % (ref 0.0–0.2)

## 2024-03-12 LAB — URINALYSIS, ROUTINE W REFLEX MICROSCOPIC
Bilirubin Urine: NEGATIVE
Glucose, UA: NEGATIVE mg/dL
Hgb urine dipstick: NEGATIVE
Ketones, ur: 5 mg/dL — AB
Leukocytes,Ua: NEGATIVE
Nitrite: NEGATIVE
Protein, ur: NEGATIVE mg/dL
Specific Gravity, Urine: 1.019 (ref 1.005–1.030)
pH: 5 (ref 5.0–8.0)

## 2024-03-12 LAB — CREATININE, SERUM
Creatinine, Ser: 0.78 mg/dL (ref 0.44–1.00)
GFR, Estimated: >60 mL/min (ref >60)

## 2024-03-12 MED ORDER — ACETAMINOPHEN 650 MG RE SUPP: 650.0000 mg | Freq: Four times a day (QID) | RECTAL | Status: DC | PRN

## 2024-03-12 MED ORDER — METHOCARBAMOL 1000 MG/10ML IJ SOLN
500.0000 mg | Freq: Four times a day (QID) | INTRAMUSCULAR | Status: DC | PRN
  Filled 2024-03-12: qty 5

## 2024-03-12 MED ORDER — TRAMADOL HCL 50 MG PO TABS
50.0000 mg | ORAL_TABLET | Freq: Once | ORAL | Status: AC
  Administered 2024-03-12: 50 mg via ORAL
  Filled 2024-03-12: qty 1

## 2024-03-12 MED ORDER — SODIUM CHLORIDE 0.9 % IV BOLUS
1000.0000 mL | Freq: Once | INTRAVENOUS | Status: AC
  Administered 2024-03-12: 1000 mL via INTRAVENOUS

## 2024-03-12 MED ORDER — OXYCODONE HCL 5 MG PO TABS
5.0000 mg | ORAL_TABLET | ORAL | Status: DC | PRN
  Administered 2024-03-13 – 2024-03-16 (×6): 5 mg via ORAL
  Filled 2024-03-12 (×7): qty 1

## 2024-03-12 MED ORDER — ONDANSETRON HCL 4 MG/2ML IJ SOLN: 4.0000 mg | Freq: Four times a day (QID) | INTRAMUSCULAR | Status: DC | PRN

## 2024-03-12 MED ORDER — MORPHINE SULFATE (PF) 2 MG/ML IV SOLN
2.0000 mg | INTRAVENOUS | Status: DC | PRN
  Administered 2024-03-12 – 2024-03-16 (×11): 2 mg via INTRAVENOUS
  Filled 2024-03-12 (×11): qty 1

## 2024-03-12 MED ORDER — ACETAMINOPHEN 325 MG PO TABS: 650.0000 mg | ORAL_TABLET | Freq: Four times a day (QID) | ORAL | Status: DC | PRN

## 2024-03-12 MED ORDER — MORPHINE SULFATE (PF) 4 MG/ML IV SOLN: 4.0000 mg | Freq: Once | INTRAVENOUS | Status: DC

## 2024-03-12 MED ORDER — BISACODYL 5 MG PO TBEC
5.0000 mg | DELAYED_RELEASE_TABLET | Freq: Every day | ORAL | Status: DC | PRN
  Administered 2024-03-13 – 2024-03-14 (×3): 5 mg via ORAL
  Filled 2024-03-12 (×3): qty 1

## 2024-03-12 MED ORDER — ONDANSETRON HCL 4 MG/2ML IJ SOLN
4.0000 mg | Freq: Once | INTRAMUSCULAR | Status: AC
  Administered 2024-03-12: 4 mg via INTRAVENOUS
  Filled 2024-03-12: qty 2

## 2024-03-12 MED ORDER — DEXAMETHASONE SODIUM PHOSPHATE 10 MG/ML IJ SOLN
8.0000 mg | Freq: Two times a day (BID) | INTRAMUSCULAR | Status: AC
Start: 2024-03-12 — End: 2024-03-13
  Administered 2024-03-12 – 2024-03-13 (×2): 8 mg via INTRAVENOUS
  Filled 2024-03-12 (×2): qty 1

## 2024-03-12 MED ORDER — MORPHINE SULFATE (PF) 4 MG/ML IV SOLN
4.0000 mg | Freq: Once | INTRAVENOUS | Status: AC
  Administered 2024-03-12: 4 mg via INTRAVENOUS
  Filled 2024-03-12: qty 1

## 2024-03-12 MED ORDER — ONDANSETRON HCL 4 MG PO TABS: 4.0000 mg | ORAL_TABLET | Freq: Four times a day (QID) | ORAL | Status: DC | PRN

## 2024-03-12 MED ORDER — KETOROLAC TROMETHAMINE 15 MG/ML IJ SOLN: 15.0000 mg | Freq: Three times a day (TID) | INTRAMUSCULAR | Status: AC | PRN

## 2024-03-12 MED ORDER — ENOXAPARIN SODIUM 40 MG/0.4ML IJ SOSY
40.0000 mg | PREFILLED_SYRINGE | INTRAMUSCULAR | Status: DC
  Administered 2024-03-12: 40 mg via SUBCUTANEOUS
  Filled 2024-03-12: qty 0.4

## 2024-03-12 MED ORDER — PREDNISONE 20 MG PO TABS: 40.0000 mg | ORAL_TABLET | Freq: Every day | ORAL | Status: DC

## 2024-03-12 NOTE — ED Provider Notes (Addendum)
 [facility] Provider Note    Event Date/Time   First MD Initiated Contact with Patient 03/12/24 1559     (approximate)   History   Back Pain   HPI  Wanda Martinez is a 89 y.o. female who was brought today by EMS.  Patient states left lower back pain, right shoulder pain and right knee pain.  Patient has been seen by pain management physician she is taking oxycodone every 4 hours without relief.  Last bowel movement last night.  Patient states dysuria and frequency, no fever no vomit. She has been mostly bed-bound due to her pain in the last 3 weeks.  Patient was seeing in [facility], she has x-ray left femur with clinical concern of occult hip fracture.  Patient has history of hypertension, breast cancer SP right mastectomy, GERD, Parkinson disease, chronic kidney disease, chronic left hip pain, chronic right shoulder pain. Recently she was treated for cellulitis on her left leg.     Physical Exam   Triage Vital Signs: ED Triage Vitals  Encounter Vitals Group     BP 03/12/24 1516 (!) 163/108     Systolic BP Percentile --      Diastolic BP Percentile --      Pulse Rate 03/12/24 1516 74     Resp 03/12/24 1516 19     Temp 03/12/24 1516 98 F (36.7 C)     Temp Source 03/12/24 1516 Oral     SpO2 03/12/24 1516 99 %     Weight 03/12/24 1526 165 lb (74.8 kg)     Height 03/12/24 1526 5\' 3"  (1.6 m)     Head Circumference --      Peak Flow --      Pain Score 03/12/24 1525 10     Pain Loc --      Pain Education --      Exclude from Growth Chart --     Most recent vital signs: Vitals:   03/13/24 0825 03/13/24 1558  BP: (!) 172/88 (!) 144/81  Pulse: 82 80  Resp: 16 18  Temp: (!) 97.5 F (36.4 C) 97.6 F (36.4 C)  SpO2: 96% 100%     Constitutional: Alert, NAD. Able to speak in complete sentences without cough or dyspnea  Eyes: Conjunctivae are normal.  Hypochromic conjunctiva Head: Atraumatic. Nose: No congestion/rhinnorhea. Mouth/Throat: Mucous  membranes are moist.   Neck: Painless ROM. Supple. No JVD, nodes, thyromegaly  Cardiovascular:   Good peripheral circulation.RRR no murmurs, gallops, rubs  Respiratory: Normal respiratory effort.  No retractions. Clear to auscultation bilaterally without wheezing or crackles  Gastrointestinal: Soft and nontender.  Musculoskeletal:  no deformity Right shoulder: Skin intact, no ecchymosis no hematomas, tender to palpation at the level of the acromioclavicular joint, full ROM. Right knee: Skin intact no ecchymosis, no edema.  Tender to palpation in the patellar area.  Full ROM Neurologic:  MAE spontaneously. No gross focal neurologic deficits are appreciated.  Skin:  Skin is warm, dry and intact. No rash noted. Psychiatric: Mood and affect are normal. Speech and behavior are normal.    ED Results / Procedures / Treatments   Labs (all labs ordered are listed, but only abnormal results are displayed) Labs Reviewed  URINALYSIS, ROUTINE W REFLEX MICROSCOPIC - Abnormal; Notable for the following components:      Result Value   Color, Urine YELLOW (*)    APPearance HAZY (*)    Ketones, ur 5 (*)    All other components  within normal limits  BASIC METABOLIC PANEL - Abnormal; Notable for the following components:   Sodium 132 (*)    Glucose, Bld 113 (*)    All other components within normal limits  CBC WITH DIFFERENTIAL/PLATELET - Abnormal; Notable for the following components:   RBC 3.59 (*)    Hemoglobin 11.2 (*)    HCT 33.8 (*)    All other components within normal limits  CREATININE, SERUM     EKG     RADIOLOGY I independently reviewed and interpreted imaging and agree with radiologists findings.      PROCEDURES:  Critical Care performed:   Procedures   MEDICATIONS ORDERED IN ED: Medications  acetaminophen (TYLENOL) tablet 650 mg (has no administration in time range)    Or  acetaminophen (TYLENOL) suppository 650 mg (has no administration in time range)  oxyCODONE  (Oxy IR/ROXICODONE) immediate release tablet 5 mg (has no administration in time range)  morphine (PF) 2 MG/ML injection 2 mg (2 mg Intravenous Given 03/13/24 1531)  methocarbamol (ROBAXIN) injection 500 mg (has no administration in time range)  bisacodyl (DULCOLAX) EC tablet 5 mg (5 mg Oral Given 03/13/24 0351)  ondansetron (ZOFRAN) tablet 4 mg (has no administration in time range)    Or  ondansetron (ZOFRAN) injection 4 mg (has no administration in time range)  ketorolac (TORADOL) 15 MG/ML injection 15 mg (has no administration in time range)  hydrALAZINE (APRESOLINE) injection 5 mg (has no administration in time range)  gabapentin (NEURONTIN) capsule 200 mg (200 mg Oral Given 03/13/24 1528)  lidocaine (LIDODERM) 5 % 1 patch (1 patch Transdermal Patch Removed 03/13/24 1533)  amLODipine (NORVASC) tablet 2.5 mg (2.5 mg Oral Given 03/13/24 0928)  hydrochlorothiazide (HYDRODIURIL) tablet 12.5 mg (12.5 mg Oral Given 03/13/24 0931)  losartan (COZAAR) tablet 50 mg (50 mg Oral Given 03/13/24 0931)  metoprolol succinate (TOPROL-XL) 24 hr tablet 25 mg (25 mg Oral Given 03/13/24 0931)  rosuvastatin (CRESTOR) tablet 20 mg (has no administration in time range)  citalopram (CELEXA) tablet 20 mg (20 mg Oral Given 03/13/24 0928)  donepezil (ARICEPT) tablet 10 mg (has no administration in time range)  lubiprostone (AMITIZA) capsule 24 mcg (has no administration in time range)  pantoprazole (PROTONIX) EC tablet 40 mg (40 mg Oral Given 03/13/24 0928)  melatonin tablet 5 mg (has no administration in time range)  cyclobenzaprine (FLEXERIL) tablet 10 mg (has no administration in time range)  multivitamin with minerals tablet 1 tablet (1 tablet Oral Given 03/13/24 0931)  magnesium oxide (MAG-OX) tablet 400 mg (400 mg Oral Given 03/13/24 0931)  methylPREDNISolone sodium succinate (SOLU-MEDROL) 40 mg/mL injection 40 mg (has no administration in time range)  traMADol (ULTRAM) tablet 50 mg (50 mg Oral Given 03/12/24 1705)   ondansetron (ZOFRAN) injection 4 mg (4 mg Intravenous Given 03/12/24 1918)  sodium chloride 0.9 % bolus 1,000 mL (0 mLs Intravenous Stopped 03/12/24 1955)  morphine (PF) 4 MG/ML injection 4 mg (4 mg Intravenous Given 03/12/24 1918)  dexamethasone (DECADRON) injection 8 mg (8 mg Intravenous Given 03/13/24 0932)  gadobutrol (GADAVIST) 1 MMOL/ML injection 7 mL (7 mLs Intravenous Contrast Given 03/13/24 1338)   Clinical Course as of 03/13/24 1602  Wed Mar 12, 2024  1827 Bilirubin Urine: NEGATIVE [AE]  1834 Urinalysis, Routine w reflex microscopic -Urine, Clean Catch(!) No UTI, ketones present [AE]    Clinical Course User Index [AE] Gladys Damme, PA-C    IMPRESSION / MDM / ASSESSMENT AND PLAN / ED COURSE  I reviewed the triage vital  signs and the nursing notes.  Differential diagnosis includes, but is not limited to, foraminal stenosis, fracture, muscle sprain, arthritis, osteoporosis, UTI  Patient's presentation is most consistent with acute complicated illness / injury requiring diagnostic workup.   Patient's diagnosis is consistent with chronic back pain intractable. I independently reviewed and interpreted imaging and agree with radiologists findings. Labs are  reassuring. I did review the patient's allergies and medications.Patient unable to walk in the last 3 weeks. Patient will be admitted. Her husband and son agree with the plan.  Discussed plan of care with patient, answered all of patient's questions, Patient agreeable to plan of care. Patient verbalized understanding.    FINAL CLINICAL IMPRESSION(S) / ED DIAGNOSES   Final diagnoses:  Chronic right-sided low back pain without sciatica     Rx / DC Orders   ED Discharge Orders     None        Note:  This document was prepared using Dragon voice recognition software and may include unintentional dictation errors.   Gladys Damme, PA-C 03/13/24 1559    Gladys Damme, PA-C 03/13/24 1914    Jene Every, MD 03/24/24 [phone]

## 2024-03-12 NOTE — ED Triage Notes (Signed)
 First Nurse Note: Patient to ED via [facility] from home for chronic back pain. Was being seen by pain clinic but they told her she needed to go back to PCP. Seen at [facility] for same at 3/11. VS WNL

## 2024-03-12 NOTE — Telephone Encounter (Signed)
 Let me know if there is something more I need to do.

## 2024-03-12 NOTE — Telephone Encounter (Signed)
 See other note

## 2024-03-12 NOTE — Telephone Encounter (Signed)
 Noted.

## 2024-03-12 NOTE — Telephone Encounter (Signed)
 Please call and confirm pt was evaluated. Do not see in system.

## 2024-03-12 NOTE — Telephone Encounter (Signed)
 FYI-   Called to follow up with patients son. Advised palliative care referral is in process. Son stated that patient did not go to the ED. She wanted to be direct admitted. Advised that we cannot do this. Advised that if she is in severe pain she will need to go to the ED or call her pain doctor and let them know. Advised son that we cannot move forward with clearance for the pain stimulator until pt comes in for appointment. Son is going to reach out to her pain doctor and see what next steps should be. Patient thinks going to ED will be a waste of time.

## 2024-03-12 NOTE — Telephone Encounter (Signed)
 Copied from CRM [id number]. Topic: Clinical - Red Word Triage >> Mar 12, 2024  9:08 AM Pascal Lux wrote: Red Word that prompted transfer to Nurse Triage: Stated we called to advised her to go to the hospital and she is still in pain. Answer Assessment - Initial Assessment Questions N/A Didn't understand office message and wanted a nurse to explain to her that she should go to the ER.  Protocols used: No Contact or Duplicate Contact Call-A-AH Spoke with patient and instructed her to go to ER as pcp office and nurse triage determined yesterday. Denies questions.

## 2024-03-12 NOTE — ED Triage Notes (Signed)
 Patient arrived by EMS from home for left lower back, right shoulder and right knee pain. Patients pain is chronic. Sees pain clinic and prescribed medication. Reports she cannot make it to her pcp due to pain.   Patient crying during triage

## 2024-03-12 NOTE — Telephone Encounter (Signed)
 LM for patient

## 2024-03-12 NOTE — ED Notes (Signed)
 Pt to CT

## 2024-03-12 NOTE — ED Notes (Signed)
 Pillows placed behind pt back and in between knees for comfort. NAD, pt denies further needs at this time

## 2024-03-13 ENCOUNTER — Other Ambulatory Visit: Payer: Self-pay | Admitting: Pain Medicine

## 2024-03-13 ENCOUNTER — Observation Stay

## 2024-03-13 DIAGNOSIS — Z882 Allergy status to sulfonamides status: Secondary | ICD-10-CM | POA: Diagnosis not present

## 2024-03-13 DIAGNOSIS — M47816 Spondylosis without myelopathy or radiculopathy, lumbar region: Secondary | ICD-10-CM | POA: Diagnosis not present

## 2024-03-13 DIAGNOSIS — D638 Anemia in other chronic diseases classified elsewhere: Secondary | ICD-10-CM | POA: Diagnosis not present

## 2024-03-13 DIAGNOSIS — M549 Dorsalgia, unspecified: Secondary | ICD-10-CM

## 2024-03-13 DIAGNOSIS — K219 Gastro-esophageal reflux disease without esophagitis: Secondary | ICD-10-CM | POA: Diagnosis present

## 2024-03-13 DIAGNOSIS — K5909 Other constipation: Secondary | ICD-10-CM | POA: Diagnosis not present

## 2024-03-13 DIAGNOSIS — G8929 Other chronic pain: Secondary | ICD-10-CM | POA: Diagnosis present

## 2024-03-13 DIAGNOSIS — Z820 Family history of epilepsy and other diseases of the nervous system: Secondary | ICD-10-CM | POA: Diagnosis not present

## 2024-03-13 DIAGNOSIS — Z888 Allergy status to other drugs, medicaments and biological substances status: Secondary | ICD-10-CM | POA: Diagnosis not present

## 2024-03-13 DIAGNOSIS — M461 Sacroiliitis, not elsewhere classified: Secondary | ICD-10-CM | POA: Diagnosis not present

## 2024-03-13 DIAGNOSIS — L03317 Cellulitis of buttock: Secondary | ICD-10-CM | POA: Diagnosis not present

## 2024-03-13 DIAGNOSIS — Z87440 Personal history of urinary (tract) infections: Secondary | ICD-10-CM | POA: Diagnosis not present

## 2024-03-13 DIAGNOSIS — M545 Low back pain, unspecified: Secondary | ICD-10-CM | POA: Diagnosis present

## 2024-03-13 DIAGNOSIS — Z833 Family history of diabetes mellitus: Secondary | ICD-10-CM | POA: Diagnosis not present

## 2024-03-13 DIAGNOSIS — M25511 Pain in right shoulder: Secondary | ICD-10-CM | POA: Diagnosis present

## 2024-03-13 DIAGNOSIS — M25561 Pain in right knee: Secondary | ICD-10-CM | POA: Diagnosis present

## 2024-03-13 DIAGNOSIS — M48061 Spinal stenosis, lumbar region without neurogenic claudication: Secondary | ICD-10-CM | POA: Diagnosis not present

## 2024-03-13 DIAGNOSIS — R262 Difficulty in walking, not elsewhere classified: Secondary | ICD-10-CM | POA: Diagnosis present

## 2024-03-13 DIAGNOSIS — I10 Essential (primary) hypertension: Secondary | ICD-10-CM

## 2024-03-13 DIAGNOSIS — G20A1 Parkinson's disease without dyskinesia, without mention of fluctuations: Secondary | ICD-10-CM | POA: Diagnosis present

## 2024-03-13 DIAGNOSIS — Z7982 Long term (current) use of aspirin: Secondary | ICD-10-CM | POA: Diagnosis not present

## 2024-03-13 DIAGNOSIS — K5900 Constipation, unspecified: Secondary | ICD-10-CM | POA: Diagnosis present

## 2024-03-13 DIAGNOSIS — M7062 Trochanteric bursitis, left hip: Secondary | ICD-10-CM | POA: Diagnosis not present

## 2024-03-13 DIAGNOSIS — E871 Hypo-osmolality and hyponatremia: Secondary | ICD-10-CM | POA: Diagnosis not present

## 2024-03-13 DIAGNOSIS — M5135 Other intervertebral disc degeneration, thoracolumbar region: Secondary | ICD-10-CM | POA: Diagnosis not present

## 2024-03-13 DIAGNOSIS — Z8701 Personal history of pneumonia (recurrent): Secondary | ICD-10-CM | POA: Diagnosis not present

## 2024-03-13 DIAGNOSIS — L0231 Cutaneous abscess of buttock: Secondary | ICD-10-CM | POA: Diagnosis not present

## 2024-03-13 DIAGNOSIS — M1990 Unspecified osteoarthritis, unspecified site: Secondary | ICD-10-CM | POA: Diagnosis present

## 2024-03-13 DIAGNOSIS — M419 Scoliosis, unspecified: Secondary | ICD-10-CM | POA: Diagnosis not present

## 2024-03-13 DIAGNOSIS — Z853 Personal history of malignant neoplasm of breast: Secondary | ICD-10-CM | POA: Diagnosis not present

## 2024-03-13 DIAGNOSIS — Z90710 Acquired absence of both cervix and uterus: Secondary | ICD-10-CM | POA: Diagnosis not present

## 2024-03-13 DIAGNOSIS — Z9013 Acquired absence of bilateral breasts and nipples: Secondary | ICD-10-CM | POA: Diagnosis not present

## 2024-03-13 DIAGNOSIS — Z79811 Long term (current) use of aromatase inhibitors: Secondary | ICD-10-CM | POA: Diagnosis not present

## 2024-03-13 MED ORDER — LOSARTAN POTASSIUM 50 MG PO TABS
50.0000 mg | ORAL_TABLET | Freq: Every day | ORAL | Status: DC
  Administered 2024-03-13 – 2024-03-16 (×4): 50 mg via ORAL
  Filled 2024-03-13 (×4): qty 1

## 2024-03-13 MED ORDER — METOPROLOL SUCCINATE ER 25 MG PO TB24
25.0000 mg | ORAL_TABLET | Freq: Two times a day (BID) | ORAL | Status: DC
  Administered 2024-03-13 – 2024-03-16 (×7): 25 mg via ORAL
  Filled 2024-03-13 (×7): qty 1

## 2024-03-13 MED ORDER — GADOBUTROL 1 MMOL/ML IV SOLN
7.0000 mL | Freq: Once | INTRAVENOUS | Status: AC | PRN
  Administered 2024-03-13: 7 mL via INTRAVENOUS

## 2024-03-13 MED ORDER — CITALOPRAM HYDROBROMIDE 20 MG PO TABS
20.0000 mg | ORAL_TABLET | Freq: Every day | ORAL | Status: DC
  Administered 2024-03-13 – 2024-03-16 (×4): 20 mg via ORAL
  Filled 2024-03-13 (×4): qty 1

## 2024-03-13 MED ORDER — ROSUVASTATIN CALCIUM 20 MG PO TABS
20.0000 mg | ORAL_TABLET | Freq: Every day | ORAL | Status: DC
  Administered 2024-03-13 – 2024-03-15 (×3): 20 mg via ORAL
  Filled 2024-03-13 (×3): qty 1

## 2024-03-13 MED ORDER — HYDRALAZINE HCL 20 MG/ML IJ SOLN
5.0000 mg | INTRAMUSCULAR | Status: DC | PRN
Start: 2024-03-13 — End: 2024-03-16

## 2024-03-13 MED ORDER — PANTOPRAZOLE SODIUM 40 MG PO TBEC
40.0000 mg | DELAYED_RELEASE_TABLET | Freq: Every day | ORAL | Status: DC
  Administered 2024-03-13 – 2024-03-16 (×4): 40 mg via ORAL
  Filled 2024-03-13 (×4): qty 1

## 2024-03-13 MED ORDER — ADULT MULTIVITAMIN W/MINERALS CH
1.0000 | ORAL_TABLET | Freq: Every day | ORAL | Status: DC
  Administered 2024-03-13 – 2024-03-16 (×4): 1 via ORAL
  Filled 2024-03-13 (×4): qty 1

## 2024-03-13 MED ORDER — MAGNESIUM OXIDE -MG SUPPLEMENT 400 (240 MG) MG PO TABS
400.0000 mg | ORAL_TABLET | Freq: Every day | ORAL | Status: DC
  Administered 2024-03-13 – 2024-03-16 (×4): 400 mg via ORAL
  Filled 2024-03-13 (×4): qty 1

## 2024-03-13 MED ORDER — AMLODIPINE BESYLATE 5 MG PO TABS
2.5000 mg | ORAL_TABLET | Freq: Every day | ORAL | Status: DC
Start: 2024-03-13 — End: 2024-03-16
  Administered 2024-03-13 – 2024-03-16 (×4): 2.5 mg via ORAL
  Filled 2024-03-13 (×4): qty 1

## 2024-03-13 MED ORDER — MELATONIN 5 MG PO TABS
5.0000 mg | ORAL_TABLET | Freq: Every day | ORAL | Status: DC
  Administered 2024-03-13 – 2024-03-15 (×3): 5 mg via ORAL
  Filled 2024-03-13 (×3): qty 1

## 2024-03-13 MED ORDER — LUBIPROSTONE 24 MCG PO CAPS
24.0000 ug | ORAL_CAPSULE | Freq: Every day | ORAL | Status: DC
  Administered 2024-03-14 – 2024-03-16 (×3): 24 ug via ORAL
  Filled 2024-03-13 (×3): qty 1

## 2024-03-13 MED ORDER — METHYLPREDNISOLONE SODIUM SUCC 40 MG IJ SOLR: 40.0000 mg | Freq: Every day | INTRAMUSCULAR | Status: DC

## 2024-03-13 MED ORDER — CYCLOBENZAPRINE HCL 10 MG PO TABS
10.0000 mg | ORAL_TABLET | Freq: Every day | ORAL | Status: DC
  Administered 2024-03-13 – 2024-03-15 (×3): 10 mg via ORAL
  Filled 2024-03-13 (×3): qty 1

## 2024-03-13 MED ORDER — DONEPEZIL HCL 5 MG PO TABS
10.0000 mg | ORAL_TABLET | Freq: Every day | ORAL | Status: DC
  Administered 2024-03-13 – 2024-03-15 (×3): 10 mg via ORAL
  Filled 2024-03-13 (×3): qty 2

## 2024-03-13 MED ORDER — METHYLPREDNISOLONE SODIUM SUCC 40 MG IJ SOLR
40.0000 mg | Freq: Every day | INTRAMUSCULAR | Status: DC
  Administered 2024-03-13 – 2024-03-16 (×4): 40 mg via INTRAVENOUS
  Filled 2024-03-13 (×4): qty 1

## 2024-03-13 MED ORDER — LIDOCAINE 5 % EX PTCH
1.0000 | MEDICATED_PATCH | CUTANEOUS | Status: DC
  Administered 2024-03-13 – 2024-03-16 (×4): 1 via TRANSDERMAL
  Filled 2024-03-13 (×4): qty 1

## 2024-03-13 MED ORDER — GABAPENTIN 100 MG PO CAPS
200.0000 mg | ORAL_CAPSULE | Freq: Three times a day (TID) | ORAL | Status: DC
  Administered 2024-03-13 – 2024-03-16 (×10): 200 mg via ORAL
  Filled 2024-03-13 (×10): qty 2

## 2024-03-13 MED ORDER — HYDROCHLOROTHIAZIDE 12.5 MG PO TABS
12.5000 mg | ORAL_TABLET | Freq: Every morning | ORAL | Status: DC
  Administered 2024-03-13 – 2024-03-16 (×4): 12.5 mg via ORAL
  Filled 2024-03-13 (×4): qty 1

## 2024-03-13 NOTE — Assessment & Plan Note (Addendum)
 Chronic right knee pain Chronic right shoulder pain Ambulatory dysfunction Bilateral sacroiliitis, left hip bursitis. Multimodal pain management with opiates, Neurontin, low-dose Toradol, lidocaine patch.  Continue Solu-Medrol. MRI of the lumbar sacral spine did not show any osseous metastatic disease CT scan of the hip did not show any fracture. Continue working with PT and OT. Patient having less pain in her sacroiliac joints.  Patient did better with the mobility specialist today.

## 2024-03-13 NOTE — Plan of Care (Signed)
   Problem: Education: Goal: Knowledge of General Education information will improve Description: Including pain rating scale, medication(s)/side effects and non-pharmacologic comfort measures Outcome: Progressing   Problem: Activity: Goal: Risk for activity intolerance will decrease Outcome: Progressing   Problem: Nutrition: Goal: Adequate nutrition will be maintained Outcome: Progressing   Problem: Coping: Goal: Level of anxiety will decrease Outcome: Progressing   Problem: Elimination: Goal: Will not experience complications related to bowel motility Outcome: Progressing   Problem: Pain Managment: Goal: General experience of comfort will improve and/or be controlled Outcome: Progressing   Problem: Safety: Goal: Ability to remain free from injury will improve Outcome: Progressing   Problem: Skin Integrity: Goal: Risk for impaired skin integrity will decrease Outcome: Progressing

## 2024-03-13 NOTE — Progress Notes (Signed)
 SLP Cancellation Note  Patient Details Name: Wanda Martinez MRN: [id number] DOB: 1935/09/10   Cancelled treatment:       Reason Eval/Treat Not Completed: SLP screened, no needs identified, will sign off  Chart reviewed, PT/OT have completed their evaluations with no acute cognitive deficits identified, attending also supports appropriate cognitive function.  Celestino Ackerman B. Dreama Saa, M.S., CCC-SLP, CBIS Speech-Language Pathologist Certified Brain Injury Specialist [facility] [phone] Ascom [phone] Fax [phone]   Reuel Derby 03/13/2024, 11:29 AM

## 2024-03-13 NOTE — Assessment & Plan Note (Signed)
 Follow-up as outpatient

## 2024-03-13 NOTE — Hospital Course (Addendum)
 89 y.o. female with medical history significant for Chronic pain mostly of the left hip, left low back, right shoulder, right knee who is followed by the pain clinic and has had multiple treatments over several months including suprascapular nerve block, bilateral lumbar facet block x 2, suprascapular radiofrequency ablation, who presents for admission due to intractable pain to the left hip and low back for the past 2 weeks with inability to ambulate and inability to make it to her pain clinic appointments.  She denies bladder or bowel dysfunction.  Her last procedure was a palliative bilateral facet block on 02/05/2024.  Due to having cellulitis she had to postpone repeat procedure to the shoulder and following her course of antibiotics, she was in so much pain that she could not make it to her pain clinic follow-up appointments.  Today she is with her son who is requesting a diagnostic workup to get to the bottom of why she hurts so badly and why she cannot walk.  Review of pain clinic notes, reveals extensive documentation of treatment as well as barriers to recent treatment.  Patient is currently awaiting a spinal stimulator and review of last PCP telephone conversation reveals patient had to follow-up with the pain clinic to arrange.  They are working on palliative care going to the home. ED course and data review: BP 163/108 with otherwise normal vitals CBC and BMP unremarkable except for sodium of 132 and hemoglobin of 11.2.  Urinalysis with 5 ketones CT left hip with no acute findings, shows mild deformity of the sacrococcygeal junction suggesting an old healed fracture among other nonacute findings. Patient treated with tramadol for pain in the ED Hospitalist consulted for admission for intractable pain   3/27.  Patient having worsening back pain over the last 3 weeks.  Trouble getting around.  Family concerned that they are unable to get her out to appointments.  Will change prednisone over to  Solu-Medrol.  Patient requested pain management see her.  Her pain management doctor is on vacation but he will have his PA come by. 3/28.  Feeling a little bit better than yesterday.  Walked 15 feet at a time with physical therapy and walked 20 feet with mobility specialist. 3/29.  Still has not had a bowel movement.  Lactulose and MiraLAX given this morning.  Will give a Fleet enema this afternoon.  Walked 120 feet with the mobility specialist. 3/30.  Patient stable for discharge.

## 2024-03-13 NOTE — Progress Notes (Addendum)
 Arrived to unit on stretcher by [facility], transferred to bed, is having pain to lower back. IV intact to arm. Made comfortable In bed. Orientated to call bell. Sates she have some meds in her bag. Was told not to take any meds from home. Said she just brought them to show [place] what she takes.

## 2024-03-13 NOTE — Assessment & Plan Note (Addendum)
 Sodium 133.

## 2024-03-13 NOTE — Assessment & Plan Note (Deleted)
 Looks like anemia of chronic disease.  Hemoglobin 11.2

## 2024-03-13 NOTE — Assessment & Plan Note (Addendum)
 Continue Norvasc, hydrochlorothiazide losartan and Toprol.

## 2024-03-13 NOTE — Hospital Course (Signed)
 Marland Kitchen

## 2024-03-13 NOTE — Evaluation (Signed)
 Physical Therapy Evaluation Patient Details Name: Wanda Martinez MRN: [id number] DOB: [birth year] Today's Date: 03/13/2024  History of Present Illness  As per EMR: Wanda Martinez is a 89 y.o. female with medical history significant for Chronic pain mostly of the left hip, left low back, right shoulder, right knee who is followed by the pain clinic and has had multiple treatments over several months including suprascapular nerve block, bilateral lumbar facet block x 2, suprascapular radiofrequency ablation, who presents for admission due to intractable pain to the left hip and low back for the past 2 weeks with inability to ambulate and inability to make it to her pain clinic appointments.  She denies bladder or bowel dysfunction.  Her last procedure was a palliative bilateral facet block on 02/05/2024.  Due to having cellulitis she had to postpone repeat procedure to the shoulder and following her course of antibiotics, she was in so much pain that she could not make it to her pain clinic follow-up appointments.  Today she is with her son who is requesting a diagnostic workup to get to the bottom of why she hurts so badly and why she cannot walk.  Review of pain clinic notes, reveals extensive documentation of treatment as well as barriers to recent treatment.  Patient is currently awaiting a spinal stimulator and review of last PCP telephone conversation reveals patient had to follow-up with the pain clinic to arrange.  They are working on palliative care going to the home.  Clinical Impression  Pt received in bed agreeable to PT evaluation. Pt reported of 10/10 pain in L/S and Sacral area. Reduced after meds to 7/10.  Nurse gave Morphine to pt. Pt is A and O x 4, motivated and pleasant. PLOF Ind household level Management consultant. Pt's spouse assist pt with meals, and bathing. Pt remains home bound for the most part. PT assessment revealed, pt moves slow due to pain and guarding, needs CGA to  sup with transfers and ambulation, min assist for sup to sit. Pt provided with Exs to BLE and written instructions on Board for carryover. PT referred pt to Mobility specialist to improve rehab out come. PT will continue in acute and pt will benefit form continued Rehab after acute. Pt agrees with POC.        If plan is discharge home, recommend the following: A little help with walking and/or transfers;A little help with bathing/dressing/bathroom;Assistance with cooking/housework;Direct supervision/assist for medications management;Help with stairs or ramp for entrance;Assist for transportation   Can travel by private vehicle        Equipment Recommendations Rolling walker (2 wheels);BSC/3in1;Wheelchair (measurements PT)  Recommendations for Other Services       Functional Status Assessment Patient has had a recent decline in their functional status and demonstrates the ability to make significant improvements in function in a reasonable and predictable amount of time.     Precautions / Restrictions Precautions Precautions: Fall Restrictions Weight Bearing Restrictions Per Provider Order: No      Mobility  Bed Mobility Overal bed mobility: Needs Assistance Bed Mobility: Rolling, Supine to Sit Rolling: Modified independent (Device/Increase time)   Supine to sit: Min assist     General bed mobility comments: slow and guaded 2/2 pain    Transfers Overall transfer level: Needs assistance Equipment used: Rolling walker (2 wheels) Transfers: Sit to/from Stand, Bed to chair/wheelchair/BSC Sit to Stand: Contact guard assist, Supervision   Step pivot transfers: Contact guard assist, Supervision  General transfer comment: uisng FWW without full extension of B knees.    Ambulation/Gait Ambulation/Gait assistance: Contact guard assist Gait Distance (Feet): 12 Feet Assistive device: Rolling walker (2 wheels) Gait Pattern/deviations: Step-through pattern Gait velocity:  dec     General Gait Details: uneven step lenght, guarded and decreased stride.  Stairs            Wheelchair Mobility     Tilt Bed    Modified Rankin (Stroke Patients Only)       Balance Overall balance assessment: Needs assistance Sitting-balance support: Bilateral upper extremity supported Sitting balance-Leahy Scale: Normal Sitting balance - Comments: No LOB or leans noted.   Standing balance support: Bilateral upper extremity supported Standing balance-Leahy Scale: Good Standing balance comment: guarded 2/2 to pain. relies on FWW for balance.                             Pertinent Vitals/Pain Pain Assessment Pain Assessment: 0-10 Pain Score: 10-Worst pain ever Pain Location: Left L/S Pain Descriptors / Indicators: Constant, Aching, Grimacing, Guarding, Moaning Pain Intervention(s): RN gave pain meds during session    Home Living Family/patient expects to be discharged to:: Private residence Living Arrangements: Spouse/significant other Available Help at Discharge: Available 24 hours/day Type of Home: House Home Access: Stairs to enter   [employer] of Steps: 1   Home Layout: Two level;Able to live on main level with bedroom/bathroom Home Equipment: Rollator (4 wheels);Shower seat;Grab bars - toilet;Grab bars - tub/shower      Prior Function Prior Level of Function : Independent/Modified Independent             Mobility Comments: Ind ambulator at household level [employer]. ADLs Comments: Spiouse assists with set for bathing and cooking meals. Shopping and driving by Spouse. spouse Available 24/7.     Extremity/Trunk Assessment   Upper Extremity Assessment Upper Extremity Assessment: Defer to OT evaluation    Lower Extremity Assessment Lower Extremity Assessment: Generalized weakness       Communication   Communication Communication: No apparent difficulties    Cognition Arousal: Alert Behavior During Therapy:  WFL for tasks assessed/performed   PT - Cognitive impairments: No apparent impairments                       PT - Cognition Comments: A and O x 4 Following commands: Intact       Cueing Cueing Techniques: Verbal cues     General Comments      Exercises General Exercises - Lower Extremity Ankle Circles/Pumps: AROM, 10 reps, Supine Gluteal Sets: AROM, 10 reps, Supine Heel Slides: AROM, Both, 10 reps, Supine   Assessment/Plan    PT Assessment Patient needs continued PT services  PT Problem List Decreased strength;Decreased activity tolerance;Pain;Decreased mobility       PT Treatment Interventions Gait training;Stair training;Functional mobility training;Therapeutic activities;Therapeutic exercise;Neuromuscular re-education;Patient/family education    PT Goals (Current goals can be found in the Care Plan section)  Acute Rehab PT Goals Patient Stated Goal: " Want to go home when ready." PT Goal Formulation: With patient Time For Goal Achievement: 03/27/24 Potential to Achieve Goals: Good    Frequency Min 2X/week     Co-evaluation               AM-PAC PT "6 Clicks" Mobility  Outcome Measure Help needed turning from your back to your side while in a flat bed without using  bedrails?: None Help needed moving from lying on your back to sitting on the side of a flat bed without using bedrails?: A Little Help needed moving to and from a bed to a chair (including a wheelchair)?: A Little Help needed standing up from a chair using your arms (e.g., wheelchair or bedside chair)?: A Little Help needed to walk in hospital room?: A Little Help needed climbing 3-5 steps with a railing? : A Little 6 Click Score: 19    End of Session Equipment Utilized During Treatment: Gait belt Activity Tolerance: Patient limited by pain Patient left: in chair;with call bell/phone within reach (OT by pt's side) Nurse Communication: Mobility status PT Visit Diagnosis: Unsteadiness  on feet (R26.81);Muscle weakness (generalized) (M62.81);Difficulty in walking, not elsewhere classified (R26.2)    Time: 0915-1000 PT Time Calculation (min) (ACUTE ONLY): 45 min   Charges:   PT Evaluation $PT Eval Low Complexity: 1 Low PT Treatments $Gait Training: 8-22 mins $Therapeutic Exercise: 8-22 mins PT General Charges $$ ACUTE PT VISIT: 1 Visit        Janet Berlin PT DPT 10:34 AM,03/13/24

## 2024-03-13 NOTE — H&P (Signed)
 History and Physical    Patient: Wanda Martinez NGE:[id number] DOB: 11-Jun-1935 DOA: 03/12/2024 DOS: the patient was seen and examined on 03/13/2024 PCP: Dale Brandon, MD  Patient coming from: Home  Chief Complaint:  Chief Complaint  Patient presents with   Back Pain    HPI: Wanda Martinez is a 89 y.o. female with medical history significant for Chronic pain mostly of the left hip, left low back, right shoulder, right knee who is followed by the pain clinic and has had multiple treatments over several months including suprascapular nerve block, bilateral lumbar facet block x 2, suprascapular radiofrequency ablation, who presents for admission due to intractable pain to the left hip and low back for the past 2 weeks with inability to ambulate and inability to make it to her pain clinic appointments.  She denies bladder or bowel dysfunction.  Her last procedure was a palliative bilateral facet block on 02/05/2024.  Due to having cellulitis she had to postpone repeat procedure to the shoulder and following her course of antibiotics, she was in so much pain that she could not make it to her pain clinic follow-up appointments.  Today she is with her son who is requesting a diagnostic workup to get to the bottom of why she hurts so badly and why she cannot walk.  Review of pain clinic notes, reveals extensive documentation of treatment as well as barriers to recent treatment.  Patient is currently awaiting a spinal stimulator and review of last PCP telephone conversation reveals patient had to follow-up with the pain clinic to arrange.  They are working on palliative care going to the home. ED course and data review: BP 163/108 with otherwise normal vitals CBC and BMP unremarkable except for sodium of 132 and hemoglobin of 11.2.  Urinalysis with 5 ketones CT left hip with no acute findings, shows mild deformity of the sacrococcygeal junction suggesting an old healed fracture among other  nonacute findings. Patient treated with tramadol for pain in the ED Hospitalist consulted for admission for intractable pain     Past Medical History:  Diagnosis Date   Acute postoperative pain 10/25/2017   Anemia    Arm pain 07/26/2015   Arthritis    Arthritis, degenerative 03/26/2014   Back pain 11/01/2013   Bladder infection 06/2018   Breast cancer (HCC)    Masectomy - left - 1986    Breast cancer [facility])    Mastectomy-right -2014   CHEST PAIN 04/29/2010   Qualifier: Diagnosis of  By: Laural Benes RN, Erika     Chronic cystitis    Cystocele 02/22/2013   Cystocele, midline 08/19/2013   Degeneration of intervertebral disc of lumbosacral region 03/26/2014   DYSPNEA 04/29/2010   Qualifier: Diagnosis of  By: Laural Benes RN, Erika     Enthesopathy of hip 03/26/2014   GERD (gastroesophageal reflux disease)    Hiatal hernia    HTN (hypertension)    Hypokalemia 06/2018   Hyponatremia 06/2018   LBP (low back pain) 03/26/2014   Neck pain 11/01/2013   Parkinson disease (HCC)    Pneumonia 06/2018   Sinusitis 02/07/2015   Skin lesions 07/12/2014   Urinary incontinence    mixed    Past Surgical History:  Procedure Laterality Date   ABDOMINAL HYSTERECTOMY     BLEPHAROPLASTY     BREAST BIOPSY  2013   BREAST ENHANCEMENT SURGERY  1987   BREAST IMPLANT REMOVAL     BREAST IMPLANT REMOVAL Right 08/29/2012   BREAST SURGERY  1986  s/p left mastectomy   COLONOSCOPY WITH PROPOFOL N/A 09/13/2016   Procedure: COLONOSCOPY WITH PROPOFOL;  Surgeon: Scot Jun, MD;  Location: [facility] ENDOSCOPY;  Service: Endoscopy;  Laterality: N/A;   MASTECTOMY  08/2012   right   TONSILLECTOMY AND ADENOIDECTOMY  1979   VESICOVAGINAL FISTULA CLOSURE W/ TAH  1983   Social History:  reports that she has never smoked. She has never been exposed to tobacco smoke. She has never used smokeless tobacco. She reports current drug use. She reports that she does not drink alcohol.  Allergies  Allergen Reactions   Sulfa Antibiotics  Nausea Only   Vesicare [Solifenacin] Other (See Comments)    Constipation Constipation    Family History  Problem Relation Age of Onset   Diabetes Father    Stroke Father    Colon polyps Father    Stroke Mother    Parkinson's disease Mother     Prior to Admission medications   Medication Sig Start Date End Date Taking? Authorizing Provider  amLODipine (NORVASC) 2.5 MG tablet Take 1 tablet (2.5 mg total) by mouth daily. 09/08/22   Dale [place], MD  aspirin 81 MG EC tablet Take 81 mg by mouth daily as needed.    [provider]  calcium carbonate (OSCAL) 1500 (600 Ca) MG TABS tablet Take 1 tablet by mouth daily.    [provider]  citalopram (CELEXA) 10 MG tablet Take 10 mg by mouth daily.    [provider]  citalopram (CELEXA) 20 MG tablet TAKE 1 TABLET BY MOUTH ONCE DAILY. 06/05/22   Dale [place], MD  cyclobenzaprine (FLEXERIL) 10 MG tablet TAKE ONE TABLET BY MOUTH AT BEDTIME. 05/04/22   Dale [place], MD  donepezil (ARICEPT) 10 MG tablet TAKE ONE TABLET BY MOUTH AT BEDTIME 04/19/23   Dale [place], MD  doxycycline (VIBRA-TABS) 100 MG tablet Take 1 tablet (100 mg total) by mouth 2 (two) times daily. 02/25/24   Dale [place], MD  gabapentin (NEURONTIN) 300 MG capsule Take 1 capsule (300 mg total) by mouth 2 (two) times daily. 12/28/23   Dale Eugenio Saenz, MD  Glucosamine-Chondroitin 500-400 MG CAPS Take 2 capsules by mouth daily.    [provider]  hydrochlorothiazide (HYDRODIURIL) 12.5 MG tablet Take 12.5 mg by mouth every morning. 07/09/23   [provider]  letrozole (FEMARA) 2.5 MG tablet Take 2.5 mg by mouth daily. 04/26/16   [provider]  losartan (COZAAR) 50 MG tablet TAKE ONE TABLET BY MOUTH ONCE DAILY 02/08/24   Dale [place], MD  lubiprostone (AMITIZA) 24 MCG capsule TAKE 1 CAPSULE BY MOUTH ONCE DAILY WITH BREAKFAST. 07/25/16   [provider]  magnesium oxide (MAG-OX) 400 (240 Mg) MG tablet TAKE ONE TABLET  BY MOUTH EVERY DAY 05/16/23   Dale [place], MD  melatonin 1 MG TABS tablet Take 3 mg by mouth at bedtime.    [provider]  metoprolol succinate (TOPROL-XL) 25 MG 24 hr tablet Take 1 tablet (25 mg total) by mouth 2 (two) times daily. 10/25/23   Dale , MD  mirabegron ER (MYRBETRIQ) 50 MG TB24 tablet Take 1 tablet (50 mg total) by mouth daily. 05/07/23   [name], MD  Multiple Vitamin (MULTIVITAMIN) tablet Take 1 tablet by mouth daily.    [provider]  naloxone [facility]) nasal spray 4 mg/0.1 mL Place 1 spray into the nose as needed for up to 365 doses (for opioid-induced respiratory depresssion). In case of emergency (overdose), spray once into each nostril. If no  response within 3 minutes, repeat application and call 911. 11/12/23 11/11/24  [name], MD  nitrofurantoin, macrocrystal-monohydrate, (MACROBID) 100 MG capsule Take 100 mg by mouth 2 (two) times daily.    [provider]  Omega-3 1000 MG CAPS Take by mouth.    [provider]  oxyCODONE (OXY IR/ROXICODONE) 5 MG immediate release tablet Take 1 tablet (5 mg total) by mouth every 6 (six) hours as needed for severe pain (pain score 7-10) or breakthrough pain (PRN for post-radiofrequency pain). Must last 30 days. 02/12/24 03/13/24  [name], MD  oxyCODONE (OXY IR/ROXICODONE) 5 MG immediate release tablet Take 1 tablet (5 mg total) by mouth every 6 (six) hours as needed for severe pain (pain score 7-10) or breakthrough pain (PRN for post-radiofrequency pain). Must last 30 days. 03/13/24 04/12/24  [name], MD  oxyCODONE (OXY IR/ROXICODONE) 5 MG immediate release tablet Take 1 tablet (5 mg total) by mouth every 6 (six) hours as needed for severe pain (pain score 7-10) or breakthrough pain (PRN for post-radiofrequency pain). Must last 30 days. 04/12/24 05/12/24  [name], MD  pantoprazole (PROTONIX) 40 MG tablet TAKE ONE TABLET BY MOUTH EVERY DAY 09/12/23   Dale [place], MD  rosuvastatin (CRESTOR) 20 MG tablet TAKE ONE TABLET BY MOUTH AT BEDTIME 06/20/23   Dale Foraker, MD    Physical Exam: Vitals:   03/12/24 1934 03/12/24 2200 03/13/24 0040 03/13/24 0054  BP:  (!) 128/58 (!) 172/88   Pulse:  67 81   Resp:  18 20   Temp:   97.8 F (36.6 C)   TempSrc:      SpO2: 100% 97% 98%   Weight:    70.9 kg  Height:    5\' 3"  (1.6 m)   Physical Exam Vitals and nursing note reviewed.  Constitutional:      General: She is not in acute distress. HENT:     Head: Normocephalic and atraumatic.  Cardiovascular:     Rate and Rhythm: Normal rate and regular rhythm.     Heart sounds: Normal heart sounds.  Pulmonary:     Effort: Pulmonary effort is normal.     Breath sounds: Normal breath sounds.  Abdominal:     Palpations: Abdomen is soft.     Tenderness: There is no abdominal tenderness.  Neurological:     Mental Status: Mental status is at baseline.     Labs on Admission: I have personally reviewed following labs and imaging studies  CBC: Recent Labs  Lab 03/12/24 1914  WBC 6.6  NEUTROABS 4.4  HGB 11.2*  HCT 33.8*  MCV 94.2  PLT 226   Basic Metabolic Panel: Recent Labs  Lab 03/12/24 1914  NA 132*  K 3.5  CL 100  CO2 24  GLUCOSE 113*  BUN 22  CREATININE 0.78  0.83  CALCIUM 8.9   GFR: Estimated Creatinine Clearance: 45.9 mL/min (by C-G formula based on SCr of 0.83 mg/dL). Liver Function Tests: No results for input(s): "AST", "ALT", "ALKPHOS", "BILITOT", "PROT", "ALBUMIN" in the last 168 hours. No results for input(s): "LIPASE", "AMYLASE" in the last 168 hours. No results for input(s): "AMMONIA" in the last 168 hours. Coagulation Profile: No results for input(s): "INR", "PROTIME" in the last 168 hours. Cardiac Enzymes: No results for input(s): "CKTOTAL", "CKMB", "CKMBINDEX", "TROPONINI" in the last 168 hours. BNP (last 3 results) No results for input(s): "PROBNP" in the last 8760 hours. HbA1C: No results for input(s):  "HGBA1C" in the last 72 hours. CBG: No  results for input(s): "GLUCAP" in the last 168 hours. Lipid Profile: No results for input(s): "CHOL", "HDL", "LDLCALC", "TRIG", "CHOLHDL", "LDLDIRECT" in the last 72 hours. Thyroid Function Tests: No results for input(s): "TSH", "T4TOTAL", "FREET4", "T3FREE", "THYROIDAB" in the last 72 hours. Anemia Panel: No results for input(s): "VITAMINB12", "FOLATE", "FERRITIN", "TIBC", "IRON", "RETICCTPCT" in the last 72 hours. Urine analysis:    Component Value Date/Time   COLORURINE YELLOW (A) 03/12/2024 1741   APPEARANCEUR HAZY (A) 03/12/2024 1741   APPEARANCEUR Clear 05/07/2023 1303   LABSPEC 1.019 03/12/2024 1741   LABSPEC 1.006 10/08/2014 1745   PHURINE 5.0 03/12/2024 1741   GLUCOSEU NEGATIVE 03/12/2024 1741   GLUCOSEU NEGATIVE 07/31/2023 1409   HGBUR NEGATIVE 03/12/2024 1741   BILIRUBINUR NEGATIVE 03/12/2024 1741   BILIRUBINUR Negative 07/31/2023 1404   BILIRUBINUR Negative 05/07/2023 1303   BILIRUBINUR Negative 10/08/2014 1745   KETONESUR 5 (A) 03/12/2024 1741   PROTEINUR NEGATIVE 03/12/2024 1741   UROBILINOGEN 0.2 07/31/2023 1409   UROBILINOGEN 0.2 07/31/2023 1404   NITRITE NEGATIVE 03/12/2024 1741   LEUKOCYTESUR NEGATIVE 03/12/2024 1741   LEUKOCYTESUR Trace 10/08/2014 1745    Radiological Exams on Admission: CT Hip Left Wo Contrast Result Date: 03/12/2024 CLINICAL DATA:  Left hip pain.  Chronic back pain. EXAM: CT OF THE LEFT HIP WITHOUT CONTRAST TECHNIQUE: Multidetector CT imaging of the left hip was performed according to the standard protocol. Multiplanar CT image reconstructions were also generated. RADIATION DOSE REDUCTION: This exam was performed according to the departmental dose-optimization program which includes automated exposure control, adjustment of the mA and/or kV according to patient size and/or use of iterative reconstruction technique. COMPARISON:  Right hip MRI (which includes the left hip on some sequences) dated 05/03/2022  FINDINGS: Bones/Joint/Cartilage Mild deformity of the sacrococcygeal junction suggesting an old healed fracture. No fracture identified. No obvious sclerotic band to indicate stress fracture. No hip effusion. Mild axial degenerative chondral thinning. Ligaments Suboptimally assessed by CT. Muscles and Tendons Gluteus medius and minimus muscular atrophy disproportionate to the generalized muscular atrophy. Small direct left inguinal hernia contains adipose tissue. Soft tissues Sigmoid colon diverticulosis. No sciatic notch impingement or impinging lesion along the visualized part of the sacral plexus. IMPRESSION: 1. No acute findings. 2. Mild deformity of the sacrococcygeal junction suggesting an old healed fracture. 3. Mild axial degenerative chondral thinning in the left hip. 4. Gluteus medius and minimus muscular atrophy disproportionate to the generalized muscular atrophy. 5. Small direct left inguinal hernia contains adipose tissue. 6. Sigmoid colon diverticulosis. Electronically Signed   By: Gaylyn Rong M.D.   On: 03/12/2024 18:46     Data Reviewed: Relevant notes from primary care and specialist visits, past discharge summaries as available in EHR, including Care Everywhere. Prior diagnostic testing as pertinent to current admission diagnoses Updated medications and problem lists for reconciliation ED course, including vitals, labs, imaging, treatment and response to treatment Triage notes, nursing and pharmacy notes and ED provider's notes Notable results as noted in HPI   Assessment and Plan: * Intractable back pain, acute on chronic Chronic right knee pain Chronic right shoulder pain Ambulatory dysfunction Multimodal pain management with opiates, Neurontin, low-dose Toradol, lidocaine patch, dexamethasone Consider orthopedic or neurosurgery consult Consider IR consult for [facility] PT consult  Anemia Chronic and stable  Hypertension BP elevated, in part related to pain Continue  home losartan, metoprolol, amlodipine    DVT prophylaxis: SCD  Consults: none  Advance Care Planning:   Code Status: Full Code   Family Communication: Son and  husband at bedside  Disposition Plan: Back to previous home environment  Severity of Illness: The appropriate patient status for this patient is OBSERVATION. Observation status is judged to be reasonable and necessary in order to provide the required intensity of service to ensure the patient's safety. The patient's presenting symptoms, physical exam findings, and initial radiographic and laboratory data in the context of their medical condition is felt to place them at decreased risk for further clinical deterioration. Furthermore, it is anticipated that the patient will be medically stable for discharge from the hospital within 2 midnights of admission.   Author: Andris Baumann, MD 03/13/2024 3:18 AM  For on call review [email or web address].

## 2024-03-13 NOTE — Progress Notes (Signed)
 Progress Note   Patient: Wanda Martinez JYN:[id number] DOB: 03-17-1935 DOA: 03/12/2024     0 DOS: the patient was seen and examined on 03/13/2024   Brief hospital course: 89 y.o. female with medical history significant for Chronic pain mostly of the left hip, left low back, right shoulder, right knee who is followed by the pain clinic and has had multiple treatments over several months including suprascapular nerve block, bilateral lumbar facet block x 2, suprascapular radiofrequency ablation, who presents for admission due to intractable pain to the left hip and low back for the past 2 weeks with inability to ambulate and inability to make it to her pain clinic appointments.  She denies bladder or bowel dysfunction.  Her last procedure was a palliative bilateral facet block on 02/05/2024.  Due to having cellulitis she had to postpone repeat procedure to the shoulder and following her course of antibiotics, she was in so much pain that she could not make it to her pain clinic follow-up appointments.  Today she is with her son who is requesting a diagnostic workup to get to the bottom of why she hurts so badly and why she cannot walk.  Review of pain clinic notes, reveals extensive documentation of treatment as well as barriers to recent treatment.  Patient is currently awaiting a spinal stimulator and review of last PCP telephone conversation reveals patient had to follow-up with the pain clinic to arrange.  They are working on palliative care going to the home. ED course and data review: BP 163/108 with otherwise normal vitals CBC and BMP unremarkable except for sodium of 132 and hemoglobin of 11.2.  Urinalysis with 5 ketones CT left hip with no acute findings, shows mild deformity of the sacrococcygeal junction suggesting an old healed fracture among other nonacute findings. Patient treated with tramadol for pain in the ED Hospitalist consulted for admission for intractable pain   3/27.  Patient  having worsening back pain over the last 3 weeks.  Trouble getting around.  Family concerned that they are unable to get her out to appointments.  Will change prednisone over to Solu-Medrol.  Patient requested pain management see her.  Her pain management doctor is on vacation but he will have his PA come by.   Assessment and Plan: * Intractable back pain, acute on chronic Chronic right knee pain Chronic right shoulder pain Ambulatory dysfunction Multimodal pain management with opiates, Neurontin, low-dose Toradol, lidocaine patch. Change prednisone over to Solu-Medrol. MRI of the lumbar sacral spine. CT scan of the hip did not show any fracture. Continue working with PT and OT.  Hyponatremia Sodium 132  History of breast cancer Follow-up as outpatient.  Anemia Looks like anemia of chronic disease.  Hemoglobin 11.2  Hypertension Restarted her antihypertensive medications.        Subjective: Patient having back pain worsening over the last 3 weeks.  Can hardly get around.  Family concerned that they cannot get her out to appointments.  Physical Exam: Vitals:   03/13/24 0040 03/13/24 0054 03/13/24 0342 03/13/24 0825  BP: (!) 172/88  (!) 159/77 (!) 172/88  Pulse: 81  83 82  Resp: 20  18 16   Temp: 97.8 F (36.6 C)  98 F (36.7 C) (!) 97.5 F (36.4 C)  TempSrc:      SpO2: 98%  96% 96%  Weight:  70.9 kg    Height:  5\' 3"  (1.6 m)     Physical Exam HENT:     Head: Normocephalic.  Mouth/Throat:     Pharynx: No oropharyngeal exudate.  Eyes:     General: Lids are normal.     Conjunctiva/sclera: Conjunctivae normal.  Cardiovascular:     Rate and Rhythm: Normal rate and regular rhythm.     Heart sounds: Normal heart sounds, S1 normal and S2 normal.  Pulmonary:     Breath sounds: No decreased breath sounds, wheezing, rhonchi or rales.  Abdominal:     Palpations: Abdomen is soft.     Tenderness: There is no abdominal tenderness.  Musculoskeletal:     Right ankle:  No swelling.     Left ankle: No swelling.     Comments: Pain bilateral sacroiliac joints and lumbar spine and over the left hip bursa.  Skin:    General: Skin is warm.     Findings: No rash.  Neurological:     Mental Status: She is alert and oriented to person, place, and time.     Comments: Able to straight leg raise     Data Reviewed: Sodium 132, white blood cell count 6.6, hemoglobin 11.2, platelet count 226  Family Communication: Updated son on the phone  Disposition: Status is: Inpatient Remains inpatient appropriate because: Continue working with PT.  MRI of the lumbar spine.  Change prednisone to Solu-Medrol.  Planned Discharge Destination: Home with Home Health    Time spent: 28 minutes  Author: Alford Highland, MD 03/13/2024 12:36 PM  For on call review [email or web address].

## 2024-03-13 NOTE — Evaluation (Signed)
 Occupational Therapy Evaluation Patient Details Name: Wanda Martinez MRN: [id number] DOB: 06/24/1935 Today's Date: 03/13/2024   History of Present Illness   Wanda Martinez is a 89 y.o. female with medical history significant for Chronic pain mostly of the left hip, left low back, right shoulder, right knee who is followed by the pain clinic. Presented to ED with intractable pain - unable to make pain clinic appointments.   Clinical Impressions Ms Merkley was seen for OT evaluation this date. Prior to hospital admission, pt was MOD I using 4WW. Pt lives with spouse. Pt currently requires CGA + RW sit<>stand and ADL t/f, distance limited ~5 ft by 7/10 back pain. SUPERVISION standing brief mgmt. PT appears near baseline, recommend BSC and RW use for safety at home as chronic pain limiting tolerance. Pt would benefit from skilled OT to address noted impairments and functional limitations (see below for any additional details). Upon hospital discharge, recommend OT follow up.    If plan is discharge home, recommend the following:   Help with stairs or ramp for entrance;A little help with bathing/dressing/bathroom     Functional Status Assessment   Patient has had a recent decline in their functional status and demonstrates the ability to make significant improvements in function in a reasonable and predictable amount of time.     Equipment Recommendations   BSC/3in1;Other (comment) (RW)     Recommendations for Other Services         Precautions/Restrictions   Precautions Precautions: Fall Restrictions Weight Bearing Restrictions Per Provider Order: No     Mobility Bed Mobility               General bed mobility comments: not tested    Transfers Overall transfer level: Needs assistance Equipment used: Rolling walker (2 wheels) Transfers: Sit to/from Stand, Bed to chair/wheelchair/BSC Sit to Stand: Contact guard assist     Step pivot transfers:  Contact guard assist            Balance Overall balance assessment: Needs assistance Sitting-balance support: Feet supported, No upper extremity supported Sitting balance-Leahy Scale: Good     Standing balance support: No upper extremity supported, During functional activity Standing balance-Leahy Scale: Fair                             ADL either performed or assessed with clinical judgement   ADL Overall ADL's : Needs assistance/impaired                                       General ADL Comments: CGA + RW for simulated BSC t/f. SUPERVISION standing clothing mgmt      Pertinent Vitals/Pain Pain Assessment Pain Assessment: 0-10 Pain Score: 7  Pain Location: back Pain Descriptors / Indicators: Constant, Aching, Grimacing, Guarding, Moaning Pain Intervention(s): Limited activity within patient's tolerance, Premedicated before session     Extremity/Trunk Assessment Upper Extremity Assessment Upper Extremity Assessment: Generalized weakness (mild tremor, increased time for fine motor activities)   Lower Extremity Assessment Lower Extremity Assessment: Generalized weakness       Communication Communication Communication: No apparent difficulties   Cognition Arousal: Alert Behavior During Therapy: WFL for tasks assessed/performed Cognition: No apparent impairments  Following commands: Intact       Cueing  General Comments   Cueing Techniques: Verbal cues      Exercises     Shoulder Instructions      Home Living Family/patient expects to be discharged to:: Private residence Living Arrangements: Spouse/significant other Available Help at Discharge: Available 24 hours/day Type of Home: House Home Access: Stairs to enter Entrance Stairs-Number of Steps: 1   Home Layout: Two level;Able to live on main level with bedroom/bathroom     Bathroom Shower/Tub: Higher education careers adviser: Standard Bathroom Accessibility: Yes   Home Equipment: Rollator (4 wheels);Shower seat;Grab bars - toilet;Grab bars - tub/shower;Wheelchair - manual          Prior Functioning/Environment Prior Level of Function : Independent/Modified Independent             Mobility Comments: Ind ambulator at household level [employer]. ADLs Comments: Spouse assists with IADLs, set for bathing/dressing and feeding    OT Problem List: Decreased activity tolerance;Impaired balance (sitting and/or standing);Pain   OT Treatment/Interventions: Self-care/ADL training;Therapeutic exercise;Energy conservation;DME and/or AE instruction;Therapeutic activities      OT Goals(Current goals can be found in the care plan section)   Acute Rehab OT Goals Patient Stated Goal: to improve pain OT Goal Formulation: With patient Time For Goal Achievement: 03/27/24 Potential to Achieve Goals: Fair ADL Goals Pt Will Perform Grooming: with modified independence;standing Pt Will Perform Lower Body Dressing: sit to/from stand;with min assist;with caregiver independent in assisting Pt Will Transfer to Toilet: with modified independence;ambulating;regular height toilet   OT Frequency:  Min 1X/week    Co-evaluation              AM-PAC OT "6 Clicks" Daily Activity     Outcome Measure Help from another person eating meals?: None Help from another person taking care of personal grooming?: A Little Help from another person toileting, which includes using toliet, bedpan, or urinal?: A Little Help from another person bathing (including washing, rinsing, drying)?: A Little Help from another person to put on and taking off regular upper body clothing?: None Help from another person to put on and taking off regular lower body clothing?: A Lot 6 Click Score: 19   End of Session Equipment Utilized During Treatment: Rolling walker (2 wheels)  Activity Tolerance: Patient tolerated treatment well;Patient  limited by pain Patient left: in chair;with call bell/phone within reach;with chair alarm set  OT Visit Diagnosis: Other abnormalities of gait and mobility (R26.89);Muscle weakness (generalized) (M62.81)                Time: 1610-9604 OT Time Calculation (min): 12 min Charges:  OT General Charges $OT Visit: 1 Visit OT Evaluation $OT Eval Low Complexity: 1 Low  Kathie Dike, M.S. OTR/L  03/13/24, 10:27 AM  ascom [phone]

## 2024-03-14 DIAGNOSIS — M461 Sacroiliitis, not elsewhere classified: Secondary | ICD-10-CM | POA: Diagnosis not present

## 2024-03-14 DIAGNOSIS — M7062 Trochanteric bursitis, left hip: Secondary | ICD-10-CM | POA: Diagnosis not present

## 2024-03-14 DIAGNOSIS — D638 Anemia in other chronic diseases classified elsewhere: Secondary | ICD-10-CM | POA: Diagnosis not present

## 2024-03-14 DIAGNOSIS — M549 Dorsalgia, unspecified: Secondary | ICD-10-CM | POA: Diagnosis not present

## 2024-03-14 MED ORDER — BISACODYL 10 MG RE SUPP
10.0000 mg | Freq: Once | RECTAL | Status: AC
  Administered 2024-03-14: 10 mg via RECTAL
  Filled 2024-03-14: qty 1

## 2024-03-14 NOTE — Plan of Care (Signed)

## 2024-03-14 NOTE — Plan of Care (Signed)
  Problem: Education: Goal: Knowledge of General Education information will improve Description: Including pain rating scale, medication(s)/side effects and non-pharmacologic comfort measures Outcome: Progressing   Problem: Health Behavior/Discharge Planning: Goal: Ability to manage health-related needs will improve Outcome: Progressing   Problem: Clinical Measurements: Goal: Ability to maintain clinical measurements within normal limits will improve Outcome: Progressing   Problem: Activity: Goal: Risk for activity intolerance will decrease Outcome: Progressing   Problem: Nutrition: Goal: Adequate nutrition will be maintained Outcome: Progressing   Problem: Coping: Goal: Level of anxiety will decrease Outcome: Progressing   Problem: Elimination: Goal: Will not experience complications related to bowel motility Outcome: Progressing   Problem: Pain Managment: Goal: General experience of comfort will improve and/or be controlled Outcome: Progressing   Problem: Safety: Goal: Ability to remain free from injury will improve Outcome: Progressing

## 2024-03-14 NOTE — Progress Notes (Signed)
 Progress Note   Patient: Wanda Martinez ZOX:[id number] DOB: 1935-10-05 DOA: 03/12/2024     1 DOS: the patient was seen and examined on 03/14/2024   Brief hospital course: 89 y.o. female with medical history significant for Chronic pain mostly of the left hip, left low back, right shoulder, right knee who is followed by the pain clinic and has had multiple treatments over several months including suprascapular nerve block, bilateral lumbar facet block x 2, suprascapular radiofrequency ablation, who presents for admission due to intractable pain to the left hip and low back for the past 2 weeks with inability to ambulate and inability to make it to her pain clinic appointments.  She denies bladder or bowel dysfunction.  Her last procedure was a palliative bilateral facet block on 02/05/2024.  Due to having cellulitis she had to postpone repeat procedure to the shoulder and following her course of antibiotics, she was in so much pain that she could not make it to her pain clinic follow-up appointments.  Today she is with her son who is requesting a diagnostic workup to get to the bottom of why she hurts so badly and why she cannot walk.  Review of pain clinic notes, reveals extensive documentation of treatment as well as barriers to recent treatment.  Patient is currently awaiting a spinal stimulator and review of last PCP telephone conversation reveals patient had to follow-up with the pain clinic to arrange.  They are working on palliative care going to the home. ED course and data review: BP 163/108 with otherwise normal vitals CBC and BMP unremarkable except for sodium of 132 and hemoglobin of 11.2.  Urinalysis with 5 ketones CT left hip with no acute findings, shows mild deformity of the sacrococcygeal junction suggesting an old healed fracture among other nonacute findings. Patient treated with tramadol for pain in the ED Hospitalist consulted for admission for intractable pain   3/27.  Patient  having worsening back pain over the last 3 weeks.  Trouble getting around.  Family concerned that they are unable to get her out to appointments.  Will change prednisone over to Solu-Medrol.  Patient requested pain management see her.  Her pain management doctor is on vacation but he will have his PA come by.  Assessment and Plan: * Intractable back pain, acute on chronic Chronic right knee pain Chronic right shoulder pain Ambulatory dysfunction Bilateral sacroiliitis, left hip bursitis. Multimodal pain management with opiates, Neurontin, low-dose Toradol, lidocaine patch.  Continue Solu-Medrol. MRI of the lumbar sacral spine did not show any osseous metastatic disease CT scan of the hip did not show any fracture. Continue working with PT and OT. Hopefully home with home health tomorrow.  Anemia of chronic disease Last hemoglobin 11.2.  Hyponatremia Sodium 132.  Check BMP tomorrow if sodium lower may have to stop hydrochlorothiazide  History of breast cancer Follow-up as outpatient.  Hypertension Continue her antihypertensive medications.        Subjective: Patient starting to feel little bit better with the steroids.  Still having quite a bit of pain but less than when she came in.  Patient wants to work with physical therapy today and tomorrow morning and hopefully home after that.  Physical Exam: Vitals:   03/13/24 1558 03/13/24 1955 03/14/24 0446 03/14/24 0743  BP: (!) 144/81 (!) 157/70 (!) 168/75 (!) 183/71  Pulse: 80 63 (!) 54 (!) 50  Resp: 18 16 16 17   Temp: 97.6 F (36.4 C) 97.6 F (36.4 C) 97.7 F (36.5  C) 97.9 F (36.6 C)  TempSrc: Oral Oral Oral   SpO2: 100% 100% 96% 100%  Weight:      Height:       Physical Exam HENT:     Head: Normocephalic.     Mouth/Throat:     Pharynx: No oropharyngeal exudate.  Eyes:     General: Lids are normal.     Conjunctiva/sclera: Conjunctivae normal.  Cardiovascular:     Rate and Rhythm: Normal rate and regular rhythm.      Heart sounds: Normal heart sounds, S1 normal and S2 normal.  Pulmonary:     Breath sounds: No decreased breath sounds, wheezing, rhonchi or rales.  Abdominal:     Palpations: Abdomen is soft.     Tenderness: There is no abdominal tenderness.  Musculoskeletal:     Right ankle: No swelling.     Left ankle: No swelling.     Comments: Pain bilateral sacroiliac joints and lumbar spine and over the left hip bursa.  Skin:    General: Skin is warm.     Findings: No rash.  Neurological:     Mental Status: She is alert and oriented to person, place, and time.     Comments: Able to straight leg raise     Data Reviewed: MRI showing no evidence of acute abnormality or osseous metastatic disease, similar multilevel degenerative changes and scoliosis including moderate foraminal stenosis on the left at L2-L3 and on the right at L5-S1. Family Communication: Spoke with husband at the bedside and son on the phone  Disposition: Status is: Inpatient Remains inpatient appropriate because: Home with home health.  Patient would like to work with physical therapy today and tomorrow morning.  Patient still requiring IV pain medications.  Planned Discharge Destination: Home with Home Health    Time spent: 28 minutes  Author: Alford Highland, MD 03/14/2024 11:38 AM  For on call review [email or web address].

## 2024-03-14 NOTE — Plan of Care (Addendum)
 Patient is alert and oriented X 4. She does not have any bowel movements since Tuesday, gave suppository as order. Plan of care ongoing.  Problem: Education: Goal: Knowledge of General Education information will improve Description: Including pain rating scale, medication(s)/side effects and non-pharmacologic comfort measures Outcome: Progressing   Problem: Health Behavior/Discharge Planning: Goal: Ability to manage health-related needs will improve Outcome: Progressing   Problem: Clinical Measurements: Goal: Ability to maintain clinical measurements within normal limits will improve Outcome: Progressing Goal: Will remain free from infection Outcome: Progressing Goal: Diagnostic test results will improve Outcome: Progressing Goal: Respiratory complications will improve Outcome: Progressing Goal: Cardiovascular complication will be avoided Outcome: Progressing   Problem: Activity: Goal: Risk for activity intolerance will decrease Outcome: Progressing   Problem: Nutrition: Goal: Adequate nutrition will be maintained Outcome: Progressing   Problem: Elimination: Goal: Will not experience complications related to bowel motility Outcome: Progressing Goal: Will not experience complications related to urinary retention Outcome: Progressing   Problem: Safety: Goal: Ability to remain free from injury will improve Outcome: Progressing   Problem: Skin Integrity: Goal: Risk for impaired skin integrity will decrease Outcome: Progressing

## 2024-03-14 NOTE — Assessment & Plan Note (Signed)
 Last hemoglobin 11.2.

## 2024-03-14 NOTE — TOC Initial Note (Signed)
 Transition of Care [facility]) - Initial/Assessment Note    Patient Details  Name: Wanda Martinez MRN: [id number] Date of Birth: 09-21-1935  Transition of Care [facility]) CM/SW Contact:    Liliana Cline, LCSW Phone Number: 03/14/2024, 10:40 AM  Clinical Narrative:                 CSW met with patient at bedside. Patient lives with spouse who provides transportation. PCP is Dr. Lorin Picket. Pharmacy is [facility] Patient has a rollator, shower chair, grab bars, rolling walker, and wheelchair at home. Patient had [facility] in the past. Patient is agreeable to therapy recommendations for [facility] and a 3in1. Referral made to [facility] for [facility]. Referral made to Jon with [facility] for 3in1. Notified both of them of estimated DC tomorrow per MD.  Expected Discharge Plan: Home w Home Health Services Barriers to Discharge: Continued Medical Work up   Patient Goals and CMS Choice Patient states their goals for this hospitalization and ongoing recovery are:: home with home health CMS Medicare.gov Compare Post Acute Care list provided to:: Patient Choice offered to / list presented to : Patient      Expected Discharge Plan and Services       Living arrangements for the past 2 months: Single Family Home                 DME Arranged: 3-N-1 DME Agency: [facility] Date DME Agency Contacted: 03/14/24   Representative spoke with at DME Agency: Cletis [place] HH Arranged: PT, OT [facility] Agency: [facility] Date [facility] Agency Contacted: 03/14/24   Representative spoke with at [facility] Agency: Elnita Maxwell  Prior Living Arrangements/Services Living arrangements for the past 2 months: Single Family Home Lives with:: Spouse Patient language and need for interpreter reviewed:: Yes Do you feel safe going back to the place where you live?: Yes      Need for Family Participation in Patient Care: Yes (Comment) Care giver support system in place?: Yes (comment) Current home services: DME Criminal Activity/Legal  Involvement Pertinent to Current Situation/Hospitalization: No - Comment as needed  Activities of Daily Living   ADL Screening (condition at time of admission) Independently performs ADLs?: No Does the patient have a NEW difficulty with bathing/dressing/toileting/self-feeding that is expected to last >3 days?: Yes (Initiates electronic notice to provider for possible OT consult) Does the patient have a NEW difficulty with getting in/out of bed, walking, or climbing stairs that is expected to last >3 days?: No Does the patient have a NEW difficulty with communication that is expected to last >3 days?: Yes (Initiates electronic notice to provider for possible SLP consult) Is the patient deaf or have difficulty hearing?: Yes Does the patient have difficulty seeing, even when wearing glasses/contacts?: No Does the patient have difficulty concentrating, remembering, or making decisions?: No  Permission Sought/Granted Permission sought to share information with : Oceanographer granted to share information with : Yes, Verbal Permission Granted     Permission granted to share info w AGENCY: [facility]; DME agencies        Emotional Assessment       Orientation: : Oriented to Self, Oriented to Place, Oriented to  Time, Oriented to Situation Alcohol / Substance Use: Not Applicable Psych Involvement: No (comment)  Admission diagnosis:  Intractable back pain [M54.9] Low back pain [M54.50] Patient Active Problem List   Diagnosis Date Noted   Low back pain 03/13/2024   Intractable back pain, acute on chronic 03/12/2024  Chronic hip pain (Left) 03/04/2024   Osteoarthritis of hips (Bilateral) (R>L) 03/04/2024   Cellulitis 03/03/2024   Wound, open, leg 02/24/2024   Unsteadiness 12/05/2023   Pes anserinus bursitis of right knee 11/28/2023   Abnormal MRI, shoulder (Right) (06/21/2023) 11/28/2023   Medial knee pain (Right) 11/13/2023   Chronic patellofemoral pain of  knee (Right) 11/13/2023   Patellofemoral joint pain (Right) 11/13/2023   Knee hemarthrosis (Right) 11/13/2023   Lumbar facet joint pain 11/01/2023   Traumatic ecchymosis of right lower leg 10/11/2023   Pes anserine bursitis (Right) 09/19/2023   Infrapatellar bursitis of knee (Right) 09/19/2023   Personal history of breast cancer 08/31/2023   Tremor, unspecified 08/31/2023   Labial lesion 08/23/2023   Urinary tract infection symptoms 08/12/2023   Chronic low back pain (Right) w/ sciatica (Right) 05/22/2023   Lower extremity weakness (Right) 05/22/2023   Chronic radicular pain of lower back 05/22/2023   Chronic sciatica (Right) 05/22/2023   Lumbosacral radiculopathy at L5 (Right) 05/22/2023   Low back pain of over 3 months duration 05/22/2023   PSVT (paroxysmal supraventricular tachycardia) (HCC) 05/02/2023   Hoffa's fat pad disease (HCC) (Right) 04/25/2023   Hoffa's pad syndrome (HCC) (Right) 04/25/2023   Abnormal urine 03/27/2023   Hypokalemia 03/27/2023   Other specified urinary incontinence 03/27/2023   Anemia of chronic disease 03/27/2023   Chronic low back pain (Right) w/o sciatica 03/01/2023   Acute kidney injury superimposed on chronic kidney disease (HCC) 02/26/2023   Normocytic anemia 02/26/2023   Chronic cystitis 02/26/2023   Weakness 02/26/2023   Ptosis 01/26/2023   Anxiety disorder, unspecified 12/18/2022   Depression, unspecified 12/18/2022   Chronic kidney disease, stage 3a (HCC) 12/18/2022   Hypertensive chronic kidney disease w stg 1-4/unsp chr kdny 12/18/2022   Age-related osteoporosis without current pathological fracture 12/18/2022   Pure hypercholesterolemia, unspecified 12/18/2022   Asymptomatic bradycardia 07/27/2022   Hypoglycemia 07/27/2022   Dermatochalasis of both upper eyelids 07/19/2022   Ptosis of both upper eyelids 07/19/2022   Abnormal MRI, knee (Right) (11/29/2023) 06/26/2022   Tricompartment osteoarthritis of knee (Right) 06/26/2022   Abnormal  MRI, hip (Right) (05/04/2022) 05/30/2022   Pre-op evaluation 03/20/2022   Facial lesion 03/08/2022   DDD (degenerative disc disease), lumbar 01/10/2022   Lumbar radicular pain (Bilateral) 01/10/2022   Lumbar foraminal stenosis (Right: L3-4, L4-5, L5-S1) (Left: L2-3) 01/10/2022   Cervicalgia 08/31/2021   Environmental allergies 08/06/2021   Osteoarthritis of AC (acromioclavicular) joint (Right) 06/07/2021   Osteoarthritis of shoulder (Right) 06/07/2021   Unspecified injury of muscle(s) and tendon(s) of the rotator cuff of shoulder, sequela (Right) 06/07/2021   Chronic use of opiate for therapeutic purpose 05/18/2021   Aortic atherosclerosis (HCC) 05/08/2021   Healthcare maintenance 05/08/2021   Osteoarthritis of knee (Right) 04/19/2021   Trigger point of shoulder region (Right) 04/19/2021   Chronic knee pain (Right) 03/15/2021   Lumbar facet hypertrophy (Multilevel) (Bilateral) 11/18/2020   Palpitations 08/26/2020   Osteoporosis 07/27/2020   Increased heart rate 06/12/2020   Hyperglycemia 06/11/2020   Osteoarthritis involving multiple joints 03/18/2020   Other spondylosis, sacral and sacrococcygeal region 03/18/2020   Acute cystitis 03/05/2020   Abnormal MRI, lumbar spine (08/23/2022) 01/08/2020   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/2019   SOB (shortness of breath) 10/17/2019   Left ear pain 04/12/2019   Chronic hip pain (Right) 02/13/2019   Osteoarthritis of hip (Right) 02/13/2019   Loss of memory 02/10/2019   Facial pain 02/10/2019   Chronic musculoskeletal pain 01/08/2019   Chronic  upper extremity pain (Right) 10/17/2018   Chronic thoracic back pain (Bilateral) (L>R) 10/17/2018   Trigger point of thoracic region (Bilateral) (L>R) 10/17/2018   Other specified dorsopathies, sacral and sacrococcygeal region 10/17/2018   Sacroiliac joint dysfunction (Right) 10/17/2018   Osteoarthritis of sacroiliac joint (Right) 10/17/2018   Somatic dysfunction of sacroiliac joint (Right)  10/17/2018   Pharmacologic therapy 07/17/2018   Disorder of skeletal system 07/17/2018   Problems influencing health status 07/17/2018   Hyponatremia 07/09/2018   Fall 06/30/2018   Gastroesophageal reflux disease 06/30/2018   Opioid-induced constipation (OIC) 06/30/2018   DDD (degenerative disc disease), lumbosacral 05/09/2018   Spondylosis without myelopathy or radiculopathy, lumbosacral region 02/12/2018   Trigger point with back pain (Right) 02/12/2018   Trochanteric bursitis of hip (Bilateral) 10/22/2017   Osteopenia 10/09/2017   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, L4-5, L5-S1) (Right) 10/03/2017   Lumbar spinal stenosis (with neurogenic claudication) (L3-4) 10/03/2017   Chronic lower extremity pain (Right) 10/03/2017   Chronic lumbar radicular pain (Right) 10/03/2017   Chronic sacroiliac joint pain (Right) 09/10/2017   Chronic sacroiliac joint pain (Left) 07/23/2017   Chronic pain syndrome 01/16/2017   Cervical facet hypertrophy 06/12/2016   Cervical facet syndrome (HCC) 06/12/2016   Chronic shoulder pain (Right) 06/12/2016   Chronic constipation 05/26/2016   History of breast cancer 03/28/2016   Lumbar facet syndrome (Bilateral) (R>L) 03/27/2016   Vaginitis 02/13/2016   Chronic upper back pain (Right) 12/30/2015   Myofascial pain syndrome (Right) (cervicothoracic) 12/30/2015   Long term current use of opiate analgesic 12/29/2015   Long term prescription opiate use 12/29/2015   Opiate use 12/29/2015   Encounter for therapeutic drug level monitoring 12/29/2015   Encounter for chronic pain management 12/29/2015   Chronic low back pain (1ry area of Pain) (Bilateral) (R>L) w/o sciatica 12/29/2015   Lumbar spondylosis 12/29/2015   Chronic hip pain (Bilateral) 12/29/2015   Chronic neck pain 12/29/2015   Cervical spondylosis 12/29/2015   Chronic cervical radicular pain (Right) 12/29/2015   Diffuse myofascial pain syndrome 12/29/2015   Neurogenic pain 12/29/2015   Fatigue  09/04/2015   Parkinsons (HCC) 06/07/2015   Sinusitis 02/07/2015   Dysuria 11/12/2014   Headache 10/18/2014   Obesity 06/22/2014   Depression, major, single episode, mild (HCC) 06/22/2014   HLD (hyperlipidemia) 03/26/2014   Pure hypercholesterolemia 03/26/2014   Toenail fungus 03/08/2014   CKD (chronic kidney disease) stage 3, GFR 30-59 ml/min (HCC) 08/19/2013   Incomplete bladder emptying 08/19/2013   Mixed incontinence 08/19/2013   Bladder infection, chronic 08/19/2013   Hernia, rectovaginal 08/19/2013   Chronic kidney disease, stage 3b (HCC) 08/19/2013   Tremor 08/03/2013   Frequent UTI 02/22/2013   Anemia 02/22/2013   Hypercholesterolemia 04/29/2010   Hypertension 04/29/2010   PCP:  D[place], MD Pharmacy:   [facility] - [place], [place] - [address] [place] [address] Phone: [phone] Fax: [phone]     Social Drivers of Health (SDOH) Social History: SDOH Screenings   Food Insecurity: No Food Insecurity (03/13/2024)  Housing: Unknown (03/13/2024)  Transportation Needs: No Transportation Needs (03/13/2024)  Utilities: Not At Risk (03/13/2024)  Alcohol Screen: Low Risk  (01/16/2024)  Depression (PHQ2-9): Low Risk  (02/05/2024)  Financial Resource Strain: Low Risk  (01/16/2024)  Physical Activity: Inactive (01/16/2024)  Social Connections: Moderately Integrated (03/13/2024)  Stress: No Stress Concern Present (01/16/2024)  Tobacco Use: Low Risk  (03/12/2024)  Health Literacy: Adequate Health Literacy (01/16/2024)   SDOH Interventions:     Readmission  Risk Interventions    03/14/2024   10:38 AM  Readmission Risk Prevention Plan  Transportation Screening Complete  PCP or Specialist Appt within 3-5 Days Complete  HRI or Home Care Consult Complete  Social Work Consult for Recovery Care Planning/Counseling Complete  Palliative Care Screening Not Applicable  Medication Review Oceanographer) Complete

## 2024-03-14 NOTE — Progress Notes (Signed)
 Mobility Specialist - Progress Note     03/14/24 1700  Mobility  Activity Ambulated with assistance to bathroom;Stood at bedside;Dangled on edge of bed  Level of Assistance Contact guard assist, steadying assist  Assistive Device Front wheel walker  Distance Ambulated (ft) 20 ft  Range of Motion/Exercises Active  Activity Response Tolerated well  Mobility Referral Yes  Mobility visit 1 Mobility  Mobility Specialist Start Time (ACUTE ONLY) 1645  Mobility Specialist Stop Time (ACUTE ONLY) 1658  Mobility Specialist Time Calculation (min) (ACUTE ONLY) 13 min   Pt resting in bed on RA upon entry. Pt STS and ambulates to bathroom CGA with RW. Pt unable to go still since Tuesday, RN notified. Pt returned to bed and left with needs in reach. Bed alarm activated.   Johnathan Hausen Mobility Specialist 03/14/24, 5:19 PM

## 2024-03-14 NOTE — Care Management Important Message (Signed)
 Important Message  Patient Details  Name: Wanda Martinez MRN: [id number] Date of Birth: 1935-10-15   Important Message Given:  Yes - Medicare IM     Bernadette Hoit 03/14/2024, 10:38 AM

## 2024-03-14 NOTE — Progress Notes (Signed)
 Physical Therapy Treatment Patient Details Name: Wanda Martinez MRN: [id number] DOB: 1935/07/18 Today's Date: 03/14/2024   History of Present Illness Wanda Martinez is a 89 y.o. female with medical history significant for Chronic pain mostly of the left hip, left low back, right shoulder, right knee who is followed by the pain clinic. Presented to ED with intractable pain - unable to make pain clinic appointments.    PT Comments  Pt ready for session.  She is able to get to EOB with rails and min a x .  Steady in sitting. Stands with min a x 1 and is able to walk to bathroom and back to bed with RW and slow gait.  Generally weak appearance but no LOB or buckling noted.  Time spent on commode but void only.  After seated rest on bed she does make 2 more additional laps in room to sink and back.  Returned to supine with min a x 1.    Encouraged walking to bathroom with staff tonight and to ask for OOB tomorrow with nursing staff.  Contacted mobility team for mobility again this pm and in AM.  Pt and husband do have some concerns regarding mobility quality and possible need for rehab.  Will continue to maximize mobility opportunities. Pt encouraged to talk with MD if she does not feel strong enough to go home when the time comes as we could make a case for rehab if needed, but at this point they seem ok with home with HHPT.   If plan is discharge home, recommend the following: A little help with walking and/or transfers;A little help with bathing/dressing/bathroom;Assistance with cooking/housework;Direct supervision/assist for medications management;Help with stairs or ramp for entrance;Assist for transportation   Can travel by private vehicle        Equipment Recommendations  Rolling walker (2 wheels);BSC/3in1;Wheelchair (measurements PT)    Recommendations for Other Services       Precautions / Restrictions Precautions Precautions: Fall Restrictions Weight Bearing Restrictions  Per Provider Order: No     Mobility  Bed Mobility Overal bed mobility: Needs Assistance Bed Mobility: Supine to Sit, Sit to Supine     Supine to sit: Min assist, Used rails Sit to supine: Min assist, Used rails     Patient Response: Cooperative  Transfers Overall transfer level: Needs assistance Equipment used: Rolling walker (2 wheels) Transfers: Sit to/from Stand Sit to Stand: Contact guard assist, Min assist                Ambulation/Gait Ambulation/Gait assistance: Contact guard assist Gait Distance (Feet): 15 Feet Assistive device: Rolling walker (2 wheels) Gait Pattern/deviations: Step-through pattern, Decreased step length - right, Decreased step length - left Gait velocity: dec     General Gait Details: 15' x 4 to/from bathroom then 2 laps in room   Stairs             Wheelchair Mobility     Tilt Bed Tilt Bed Patient Response: Cooperative  Modified Rankin (Stroke Patients Only)       Balance Overall balance assessment: Needs assistance Sitting-balance support: Feet supported, No upper extremity supported Sitting balance-Leahy Scale: Good     Standing balance support: During functional activity, Bilateral upper extremity supported Standing balance-Leahy Scale: Fair                              Musician Communication: No apparent difficulties  Cognition Arousal: Alert Behavior  During Therapy: WFL for tasks assessed/performed   PT - Cognitive impairments: No apparent impairments                                Cueing Cueing Techniques: Verbal cues  Exercises      General Comments        Pertinent Vitals/Pain Pain Assessment Pain Assessment: Faces Faces Pain Scale: Hurts little more Pain Location: back Pain Descriptors / Indicators: Constant, Aching, Grimacing, Guarding, Moaning Pain Intervention(s): Limited activity within patient's tolerance, Repositioned    Home Living                           Prior Function            PT Goals (current goals can now be found in the care plan section) Progress towards PT goals: Progressing toward goals    Frequency    Min 2X/week      PT Plan      Co-evaluation              AM-PAC PT "6 Clicks" Mobility   Outcome Measure  Help needed turning from your back to your side while in a flat bed without using bedrails?: None Help needed moving from lying on your back to sitting on the side of a flat bed without using bedrails?: A Little Help needed moving to and from a bed to a chair (including a wheelchair)?: A Little Help needed standing up from a chair using your arms (e.g., wheelchair or bedside chair)?: A Little Help needed to walk in hospital room?: A Little Help needed climbing 3-5 steps with a railing? : A Lot 6 Click Score: 18    End of Session Equipment Utilized During Treatment: Gait belt Activity Tolerance: Patient tolerated treatment well;Patient limited by fatigue Patient left: in bed;with call bell/phone within reach;with bed alarm set Nurse Communication: Mobility status PT Visit Diagnosis: Unsteadiness on feet (R26.81);Muscle weakness (generalized) (M62.81);Difficulty in walking, not elsewhere classified (R26.2)     Time: 1456-1530 PT Time Calculation (min) (ACUTE ONLY): 34 min  Charges:    $Gait Training: 8-22 mins $Therapeutic Activity: 8-22 mins PT General Charges $$ ACUTE PT VISIT: 1 Visit                  Danielle Dess, PTA 03/14/24, 3:49 PM

## 2024-03-14 NOTE — Progress Notes (Signed)
 Patient is not able to walk the distance required to go the bathroom, or he/she is unable to safely negotiate stairs required to access the bathroom.  A BSC will alleviate this problem.

## 2024-03-15 DIAGNOSIS — M461 Sacroiliitis, not elsewhere classified: Secondary | ICD-10-CM | POA: Diagnosis not present

## 2024-03-15 DIAGNOSIS — K5909 Other constipation: Secondary | ICD-10-CM | POA: Diagnosis not present

## 2024-03-15 DIAGNOSIS — M549 Dorsalgia, unspecified: Secondary | ICD-10-CM | POA: Diagnosis not present

## 2024-03-15 DIAGNOSIS — M7062 Trochanteric bursitis, left hip: Secondary | ICD-10-CM | POA: Diagnosis not present

## 2024-03-15 LAB — BASIC METABOLIC PANEL WITH GFR
Anion gap: 6 (ref 5–15)
BUN: 24 mg/dL — ABNORMAL HIGH (ref 8–23)
CO2: 28 mmol/L (ref 22–32)
Calcium: 9.1 mg/dL (ref 8.9–10.3)
Chloride: 99 mmol/L (ref 98–111)
Creatinine, Ser: 0.72 mg/dL (ref 0.44–1.00)
GFR, Estimated: >60 mL/min (ref >60)
Glucose, Bld: 101 mg/dL — ABNORMAL HIGH (ref 70–99)
Potassium: 4.2 mmol/L (ref 3.5–5.1)
Sodium: 133 mmol/L — ABNORMAL LOW (ref 135–145)

## 2024-03-15 LAB — GLUCOSE, CAPILLARY: Glucose-Capillary: 122 mg/dL — ABNORMAL HIGH (ref 70–99)

## 2024-03-15 MED ORDER — AMLODIPINE BESYLATE 5 MG PO TABS: 5.0000 mg | ORAL_TABLET | Freq: Every day | ORAL | 0 refills | Status: DC

## 2024-03-15 MED ORDER — POLYETHYLENE GLYCOL 3350 17 G PO PACK
17.0000 g | PACK | Freq: Two times a day (BID) | ORAL | Status: DC
  Administered 2024-03-16: 17 g via ORAL
  Filled 2024-03-15: qty 1

## 2024-03-15 MED ORDER — HYDROCHLOROTHIAZIDE 12.5 MG PO TABS: 12.5000 mg | ORAL_TABLET | Freq: Every morning | ORAL | 0 refills | Status: DC

## 2024-03-15 MED ORDER — GABAPENTIN 100 MG PO CAPS: 200.0000 mg | ORAL_CAPSULE | Freq: Three times a day (TID) | ORAL | 0 refills | Status: DC

## 2024-03-15 MED ORDER — FLEET ENEMA RE ENEM
1.0000 | ENEMA | Freq: Once | RECTAL | Status: AC
  Administered 2024-03-15: 1 via RECTAL

## 2024-03-15 MED ORDER — ACETAMINOPHEN 325 MG PO TABS: 650.0000 mg | ORAL_TABLET | Freq: Four times a day (QID) | ORAL | Status: DC | PRN

## 2024-03-15 MED ORDER — PREDNISONE 10 MG PO TABS: ORAL_TABLET | ORAL | 0 refills | Status: DC

## 2024-03-15 MED ORDER — POLYETHYLENE GLYCOL 3350 17 G PO PACK: 17.0000 g | PACK | Freq: Every day | ORAL | 0 refills | Status: DC | PRN

## 2024-03-15 MED ORDER — POLYETHYLENE GLYCOL 3350 17 G PO PACK: 17.0000 g | PACK | Freq: Two times a day (BID) | ORAL | Status: DC

## 2024-03-15 MED ORDER — POLYETHYLENE GLYCOL 3350 17 G PO PACK
17.0000 g | PACK | Freq: Every day | ORAL | Status: DC | PRN
  Administered 2024-03-15: 17 g via ORAL
  Filled 2024-03-15: qty 1

## 2024-03-15 MED ORDER — LACTULOSE 10 GM/15ML PO SOLN
30.0000 g | Freq: Once | ORAL | Status: AC
  Administered 2024-03-15: 30 g via ORAL
  Filled 2024-03-15: qty 60

## 2024-03-15 MED ORDER — METHOCARBAMOL 500 MG PO TABS: 500.0000 mg | ORAL_TABLET | Freq: Three times a day (TID) | ORAL | 0 refills | Status: DC | PRN

## 2024-03-15 NOTE — Discharge Instructions (Signed)
 Can use Salonpas 4% over the counter and apply to area of pain for 12 hours a day

## 2024-03-15 NOTE — Assessment & Plan Note (Signed)
 Patient had a bowel movement yesterday afternoon.  I prescribed MiraLAX daily but can use that twice a day.  Needs to make sure her bowels move with being on chronic pain medication.

## 2024-03-15 NOTE — Plan of Care (Signed)

## 2024-03-15 NOTE — Progress Notes (Signed)
 Mobility Specialist - Progress Note    03/15/24 1751  Mobility  Activity Ambulated with assistance in hallway  Level of Assistance Standby assist, set-up cues, supervision of patient - no hands on  Assistive Device Front wheel walker  Distance Ambulated (ft) 140 ft  Range of Motion/Exercises Active  Activity Response Tolerated well  Mobility Referral Yes  Mobility visit 1 Mobility  Mobility Specialist Start Time (ACUTE ONLY) 1733  Mobility Specialist Stop Time (ACUTE ONLY) 1752  Mobility Specialist Time Calculation (min) (ACUTE ONLY) 19 min   Pt resting in bed on RA upon entry. Pt STS and ambulates to hallway around NS SBA with RW x2. Pt returned to bed and left with needs in reach. Bed alarm activated.   Johnathan Hausen Mobility Specialist 03/15/24, 5:54 PM

## 2024-03-15 NOTE — Progress Notes (Signed)
 Progress Note   Patient: Wanda Martinez KVQ:[id number] DOB: 07/05/1935 DOA: 03/12/2024     2 DOS: the patient was seen and examined on 03/15/2024   Brief hospital course: 89 y.o. female with medical history significant for Chronic pain mostly of the left hip, left low back, right shoulder, right knee who is followed by the pain clinic and has had multiple treatments over several months including suprascapular nerve block, bilateral lumbar facet block x 2, suprascapular radiofrequency ablation, who presents for admission due to intractable pain to the left hip and low back for the past 2 weeks with inability to ambulate and inability to make it to her pain clinic appointments.  She denies bladder or bowel dysfunction.  Her last procedure was a palliative bilateral facet block on 02/05/2024.  Due to having cellulitis she had to postpone repeat procedure to the shoulder and following her course of antibiotics, she was in so much pain that she could not make it to her pain clinic follow-up appointments.  Today she is with her son who is requesting a diagnostic workup to get to the bottom of why she hurts so badly and why she cannot walk.  Review of pain clinic notes, reveals extensive documentation of treatment as well as barriers to recent treatment.  Patient is currently awaiting a spinal stimulator and review of last PCP telephone conversation reveals patient had to follow-up with the pain clinic to arrange.  They are working on palliative care going to the home. ED course and data review: BP 163/108 with otherwise normal vitals CBC and BMP unremarkable except for sodium of 132 and hemoglobin of 11.2.  Urinalysis with 5 ketones CT left hip with no acute findings, shows mild deformity of the sacrococcygeal junction suggesting an old healed fracture among other nonacute findings. Patient treated with tramadol for pain in the ED Hospitalist consulted for admission for intractable pain   3/27.  Patient  having worsening back pain over the last 3 weeks.  Trouble getting around.  Family concerned that they are unable to get her out to appointments.  Will change prednisone over to Solu-Medrol.  Patient requested pain management see her.  Her pain management doctor is on vacation but he will have his PA come by. 3/28.  Feeling a little bit better than yesterday.  Walked 15 feet at a time with physical therapy and walked 20 feet with mobility specialist. 3/29.  Still has not had a bowel movement.  Lactulose and MiraLAX given this morning.  Will give a Fleet enema this afternoon.  Walked 120 feet with the mobility specialist.  Assessment and Plan: * Intractable back pain, acute on chronic Chronic right knee pain Chronic right shoulder pain Ambulatory dysfunction Bilateral sacroiliitis, left hip bursitis. Multimodal pain management with opiates, Neurontin, low-dose Toradol, lidocaine patch.  Continue Solu-Medrol. MRI of the lumbar sacral spine did not show any osseous metastatic disease CT scan of the hip did not show any fracture. Continue working with PT and OT. Patient having less pain in her sacroiliac joints.  Patient did better with the mobility specialist today.  Constipation Patient given a dose of MiraLAX and lactulose this morning.  Will increase MiraLAX to twice daily give a Fleet enema now.  Anemia of chronic disease Last hemoglobin 11.2.  Hyponatremia Sodium 133.   History of breast cancer Follow-up as outpatient.  Hypertension Continue her antihypertensive medications.        Subjective: Patient asking to stay to the hospital until Monday.  Patient  complains of constipation.  Given lactulose and MiraLAX this morning.  Will give a enema this afternoon.  Still having back pain but improved since when she came in.  Physical Exam: Vitals:   03/14/24 0743 03/14/24 2028 03/15/24 0629 03/15/24 0740  BP: (!) 183/71 (!) 156/77 (!) 169/79 (!) 168/72  Pulse: (!) 50 75 70 65   Resp: 17 18 16 18   Temp: 97.9 F (36.6 C) 98 F (36.7 C) 98.2 F (36.8 C) 97.7 F (36.5 C)  TempSrc:      SpO2: 100% 100% 98% 100%  Weight:      Height:       Physical Exam HENT:     Head: Normocephalic.     Mouth/Throat:     Pharynx: No oropharyngeal exudate.  Eyes:     General: Lids are normal.     Conjunctiva/sclera: Conjunctivae normal.  Cardiovascular:     Rate and Rhythm: Normal rate and regular rhythm.     Heart sounds: Normal heart sounds, S1 normal and S2 normal.  Pulmonary:     Breath sounds: No decreased breath sounds, wheezing, rhonchi or rales.  Abdominal:     Palpations: Abdomen is soft.     Tenderness: There is no abdominal tenderness.  Musculoskeletal:     Right ankle: No swelling.     Left ankle: No swelling.     Comments: Improved pain with palpating over the sacroiliac joints.  Skin:    General: Skin is warm.     Findings: No rash.  Neurological:     Mental Status: She is alert and oriented to person, place, and time.     Comments: Able to straight leg raise     Data Reviewed: Sodium 133, creatinine 0.72  Family Communication: Spoke with husband at the bedside  Disposition: Status is: Inpatient Remains inpatient appropriate because: Trying to get a bowel movement today.  Patient did walk better with physical therapy  Planned Discharge Destination: Home with Home Health    Time spent: 28 minutes  Author: Alford Highland, MD 03/15/2024 3:42 PM  For on call review [email or web address].

## 2024-03-15 NOTE — Progress Notes (Signed)
 Mobility Specialist - Progress Note     03/15/24 1513  Mobility  Activity Ambulated with assistance in hallway  Level of Assistance Standby assist, set-up cues, supervision of patient - no hands on  Assistive Device Front wheel walker  Distance Ambulated (ft) 120 ft  Range of Motion/Exercises Active  Activity Response Tolerated well  Mobility Referral Yes  Mobility visit 1 Mobility  Mobility Specialist Start Time (ACUTE ONLY) 1452  Mobility Specialist Stop Time (ACUTE ONLY) 1513  Mobility Specialist Time Calculation (min) (ACUTE ONLY) 21 min   Pt resting in bed on RA upon entry. Pt STS and ambulates to hallway up to NS SBA with RW. Pt returned to bed for 2 minute seated rest break before ambulating back to the hallway again x1. Pt returned to bed and left with needs in reach bed alarm activated.   Johnathan Hausen Mobility Specialist 03/15/24, 3:27 PM

## 2024-03-16 DIAGNOSIS — M7062 Trochanteric bursitis, left hip: Secondary | ICD-10-CM | POA: Diagnosis not present

## 2024-03-16 DIAGNOSIS — M461 Sacroiliitis, not elsewhere classified: Secondary | ICD-10-CM | POA: Diagnosis not present

## 2024-03-16 DIAGNOSIS — K5909 Other constipation: Secondary | ICD-10-CM | POA: Diagnosis not present

## 2024-03-16 DIAGNOSIS — M549 Dorsalgia, unspecified: Secondary | ICD-10-CM | POA: Diagnosis not present

## 2024-03-16 NOTE — TOC Transition Note (Signed)
 Transition of Care [facility]) - Discharge Note   Patient Details  Name: Wanda Martinez MRN: [id number] Date of Birth: 01/28/1935  Transition of Care [facility]) CM/SW Contact:  Liliana Cline, LCSW Phone Number: 03/16/2024, 9:02 AM   Clinical Narrative:    Patient has orders to DC home today. CSW notified Elnita Maxwell with [facility].   Final next level of care: Home w Home Health Services Barriers to Discharge: Barriers Resolved   Patient Goals and CMS Choice Patient states their goals for this hospitalization and ongoing recovery are:: home with home health CMS Medicare.gov Compare Post Acute Care list provided to:: Patient Choice offered to / list presented to : Patient      Discharge Placement                       Discharge Plan and Services Additional resources added to the After Visit Summary for                  DME Arranged: 3-N-1 DME Agency: [employer] Date DME Agency Contacted: 03/14/24   Representative spoke with at DME Agency: [facility] [place] HH Arranged: PT, OT [facility] Agency: [facility] Date [facility] Agency Contacted: 03/16/24   Representative spoke with at [facility] Agency: Elnita Maxwell  Social Drivers of Health (SDOH) Interventions SDOH Screenings   Food Insecurity: No Food Insecurity (03/13/2024)  Housing: Unknown (03/13/2024)  Transportation Needs: No Transportation Needs (03/13/2024)  Utilities: Not At Risk (03/13/2024)  Alcohol Screen: Low Risk  (01/16/2024)  Depression (PHQ2-9): Low Risk  (02/05/2024)  Financial Resource Strain: Low Risk  (01/16/2024)  Physical Activity: Inactive (01/16/2024)  Social Connections: Moderately Integrated (03/13/2024)  Stress: No Stress Concern Present (01/16/2024)  Tobacco Use: Low Risk  (03/12/2024)  Health Literacy: Adequate Health Literacy (01/16/2024)     Readmission Risk Interventions    03/14/2024   10:38 AM  Readmission Risk Prevention Plan  Transportation Screening Complete  PCP or Specialist Appt within 3-5  Days Complete  HRI or Home Care Consult Complete  Social Work Consult for Recovery Care Planning/Counseling Complete  Palliative Care Screening Not Applicable  Medication Review [employer]) Complete

## 2024-03-16 NOTE — Discharge Summary (Signed)
 Physician Discharge Summary   Patient: Wanda Martinez MRN: [id number] DOB: 17-May-1935  Admit date:     03/12/2024  Discharge date: 03/16/24  Discharge Physician: Alford Highland   PCP: Dale [place], MD   Recommendations at discharge:   Follow-up PCP 5 days Follow-up with pain management as scheduled  Discharge Diagnoses: Principal Problem:   Intractable back pain, acute on chronic Active Problems:   Bilateral sacroiliitis (HCC)   Constipation   Hypertension   History of breast cancer   Hyponatremia   Anemia of chronic disease   Low back pain   Trochanteric bursitis of left hip    Hospital Course: 89 y.o. female with medical history significant for Chronic pain mostly of the left hip, left low back, right shoulder, right knee who is followed by the pain clinic and has had multiple treatments over several months including suprascapular nerve block, bilateral lumbar facet block x 2, suprascapular radiofrequency ablation, who presents for admission due to intractable pain to the left hip and low back for the past 2 weeks with inability to ambulate and inability to make it to her pain clinic appointments.  She denies bladder or bowel dysfunction.  Her last procedure was a palliative bilateral facet block on 02/05/2024.  Due to having cellulitis she had to postpone repeat procedure to the shoulder and following her course of antibiotics, she was in so much pain that she could not make it to her pain clinic follow-up appointments.  Today she is with her son who is requesting a diagnostic workup to get to the bottom of why she hurts so badly and why she cannot walk.  Review of pain clinic notes, reveals extensive documentation of treatment as well as barriers to recent treatment.  Patient is currently awaiting a spinal stimulator and review of last PCP telephone conversation reveals patient had to follow-up with the pain clinic to arrange.  They are working on palliative care going to  the home. ED course and data review: BP 163/108 with otherwise normal vitals CBC and BMP unremarkable except for sodium of 132 and hemoglobin of 11.2.  Urinalysis with 5 ketones CT left hip with no acute findings, shows mild deformity of the sacrococcygeal junction suggesting an old healed fracture among other nonacute findings. Patient treated with tramadol for pain in the ED Hospitalist consulted for admission for intractable pain   3/27.  Patient having worsening back pain over the last 3 weeks.  Trouble getting around.  Family concerned that they are unable to get her out to appointments.  Will change prednisone over to Solu-Medrol.  Patient requested pain management see her.  Her pain management doctor is on vacation but he will have his PA come by. 3/28.  Feeling a little bit better than yesterday.  Walked 15 feet at a time with physical therapy and walked 20 feet with mobility specialist. 3/29.  Still has not had a bowel movement.  Lactulose and MiraLAX given this morning.  Will give a Fleet enema this afternoon.  Walked 120 feet with the mobility specialist. 3/30.  Patient stable for discharge.  Assessment and Plan: * Intractable back pain, acute on chronic Chronic right knee pain Chronic right shoulder pain Ambulatory dysfunction Bilateral sacroiliitis, left hip bursitis. Multimodal pain management with chronic opiates, can use Salonpas over-the-counter, muscle relaxant..  Improved with Solu-Medrol here and I will give a prednisone taper upon discharge. MRI of the lumbar sacral spine did not show any osseous metastatic disease CT scan of the hip  did not show any fracture. Home health Patient walked 120 feet with the mobility specialist yesterday and 100 feet today.  Constipation Patient had a bowel movement yesterday afternoon.  I prescribed MiraLAX daily but can use that twice a day.  Needs to make sure her bowels move with being on chronic pain medication.  Anemia of chronic  disease Last hemoglobin 11.2.  Hyponatremia Sodium 133.  Recommend checking a BMP as outpatient  History of breast cancer Follow-up as outpatient.  Hypertension Continue Norvasc, hydrochlorothiazide losartan and Toprol.         Consultants: Case discussed with pain management specialist Procedures performed: None Disposition: Home health Diet recommendation:  Cardiac diet DISCHARGE MEDICATION: Allergies as of 03/16/2024       Reactions   Sulfa Antibiotics Nausea Only   Vesicare [solifenacin] Other (See Comments)   Constipation Constipation        Medication List     STOP taking these medications    Baclofen 5 MG Tabs   doxycycline 100 MG tablet Commonly known as: VIBRA-TABS       TAKE these medications    acetaminophen 325 MG tablet Commonly known as: TYLENOL Take 2 tablets (650 mg total) by mouth every 6 (six) hours as needed for mild pain (pain score 1-3) (or Fever >/= 101).   amLODipine 5 MG tablet Commonly known as: NORVASC Take 1 tablet (5 mg total) by mouth daily. What changed:  medication strength how much to take   aspirin EC 81 MG tablet Take 81 mg by mouth daily as needed.   calcium carbonate 1500 (600 Ca) MG Tabs tablet Commonly known as: OSCAL Take 1 tablet by mouth daily.   citalopram 10 MG tablet Commonly known as: CELEXA Take 10 mg by mouth daily. Take long with one 20 mg tablet for total 30 mg once daily What changed: Another medication with the same name was changed. Make sure you understand how and when to take each.   citalopram 20 MG tablet Commonly known as: CELEXA TAKE 1 TABLET BY MOUTH ONCE DAILY. What changed: additional instructions   donepezil 10 MG tablet Commonly known as: ARICEPT TAKE ONE TABLET BY MOUTH AT BEDTIME   gabapentin 100 MG capsule Commonly known as: NEURONTIN Take 2 capsules (200 mg total) by mouth 3 (three) times daily. What changed:  medication strength how much to take when to take  this   Glucosamine-Chondroitin 500-400 MG Caps Take 2 capsules by mouth daily.   hydrochlorothiazide 12.5 MG tablet Commonly known as: HYDRODIURIL Take 1 tablet (12.5 mg total) by mouth every morning.   losartan 50 MG tablet Commonly known as: COZAAR TAKE ONE TABLET BY MOUTH ONCE DAILY   magnesium oxide 400 (240 Mg) MG tablet Commonly known as: MAG-OX TAKE ONE TABLET BY MOUTH EVERY DAY   melatonin 1 MG Tabs tablet Take 3 mg by mouth at bedtime.   methocarbamol 500 MG tablet Commonly known as: ROBAXIN Take 1 tablet (500 mg total) by mouth every 8 (eight) hours as needed for muscle spasms.   metoprolol succinate 25 MG 24 hr tablet Commonly known as: TOPROL-XL Take 1 tablet (25 mg total) by mouth 2 (two) times daily.   mirabegron ER 50 MG Tb24 tablet Commonly known as: MYRBETRIQ Take 1 tablet (50 mg total) by mouth daily.   multivitamin tablet Take 1 tablet by mouth daily.   naloxone 4 MG/0.1ML Liqd nasal spray kit Commonly known as: NARCAN Place 1 spray into the nose as needed for up  to 365 doses (for opioid-induced respiratory depresssion). In case of emergency (overdose), spray once into each nostril. If no response within 3 minutes, repeat application and call 911.   nitrofurantoin (macrocrystal-monohydrate) 100 MG capsule Commonly known as: MACROBID Take 100 mg by mouth 2 (two) times daily.   Omega-3 1000 MG Caps Take by mouth.   oxyCODONE 5 MG immediate release tablet Commonly known as: Oxy IR/ROXICODONE Take 1 tablet (5 mg total) by mouth every 6 (six) hours as needed for severe pain (pain score 7-10) or breakthrough pain (PRN for post-radiofrequency pain). Must last 30 days. Start taking on: April 12, 2024 What changed: Another medication with the same name was removed. Continue taking this medication, and follow the directions you see here.   pantoprazole 40 MG tablet Commonly known as: PROTONIX TAKE ONE TABLET BY MOUTH EVERY DAY   polyethylene glycol 17  g packet Commonly known as: MIRALAX / GLYCOLAX Take 17 g by mouth daily as needed for severe constipation.   predniSONE 10 MG tablet Commonly known as: DELTASONE 4 tabs po day 1; 3 tabs po day 2,3; 2 tabs po day 4,5; 1 tab po day 6,7; 1/2 tab po day 8,9,10,11   rosuvastatin 20 MG tablet Commonly known as: CRESTOR TAKE ONE TABLET BY MOUTH AT BEDTIME               Durable Medical Equipment  (From admission, onward)           Start     Ordered   03/14/24 1050  For home use only DME 3 n 1  Once        03/14/24 1050            Follow-up Information     Dale Braxton, MD Follow up.   Specialty: Internal Medicine Why: hospital follow up Contact information: [address] [place] [address] [phone]         [name], MD Follow up.   Specialty: Pain Medicine Why: keep scheuled appointment Contact information: [address] [place] [address] [phone]                Discharge Exam: Filed Weights   03/12/24 1526 03/13/24 0054  Weight: 74.8 kg 70.9 kg   Physical Exam HENT:     Head: Normocephalic.     Mouth/Throat:     Pharynx: No oropharyngeal exudate.  Eyes:     General: Lids are normal.     Conjunctiva/sclera: Conjunctivae normal.  Cardiovascular:     Rate and Rhythm: Normal rate and regular rhythm.     Heart sounds: Normal heart sounds, S1 normal and S2 normal.  Pulmonary:     Breath sounds: No decreased breath sounds, wheezing, rhonchi or rales.  Abdominal:     Palpations: Abdomen is soft.     Tenderness: There is no abdominal tenderness.  Musculoskeletal:     Right ankle: No swelling.     Left ankle: No swelling.  Skin:    General: Skin is warm.     Findings: No rash.  Neurological:     Mental Status: She is alert and oriented to person, place, and time.      Condition at discharge: stable  The results of significant diagnostics from this hospitalization  (including imaging, microbiology, ancillary and laboratory) are listed below for reference.   Imaging Studies: MR Lumbar Spine W Wo Contrast Result Date: 03/14/2024 CLINICAL DATA:  Acute low  back pain, hx breast  cancer, r/o mets. EXAM: MRI LUMBAR SPINE WITHOUT AND WITH CONTRAST TECHNIQUE: Multiplanar and multiecho pulse sequences of the lumbar spine were obtained without and with intravenous contrast. CONTRAST:  7mL GADAVIST GADOBUTROL 1 MMOL/ML IV SOLN COMPARISON:  MRI of the lumbar spine 08/22/2022. FINDINGS: Segmentation:  Standard. Alignment:  Dextrocurvature. Vertebrae: Degenerative/discogenic endplate signal changes at multiple levels. No specific evidence of acute fracture or discitis/osteomyelitis. No suspicious bone lesions or enhancing lesions. Conus medullaris and cauda equina: Conus extends to the L1-L2 level. Conus appears normal. Paraspinal and other soft tissues: Unremarkable. Disc levels: T12-L1: Disc bulge without significant stenosis. L1-L2: Left eccentric disc bulge and endplate spurring. Facet arthropathy. Mild left foraminal stenosis, similar. No significant canal stenosis. L2-L3: Disc bulge and endplate spurring. Bilateral facet arthropathy. Moderate left foraminal stenosis, similar. Patent canal. L3-L4: Disc bulging. Ligamentum flavum thickening and facet arthropathy. Mild left foraminal stenosis, similar. Patent canal and right foramen. L4-L5: Disc bulging. Bilateral facet arthropathy. Mild right foraminal stenosis, similar. No significant canal or left foraminal stenosis. L5-S1: Disc bulging. Bilateral facet arthropathy. Resulting moderate right foraminal stenosis, similar. Patent canal and left foramen. IMPRESSION: 1. No evidence of acute abnormality or osseous metastatic disease. 2. Similar multilevel degenerative change and scoliosis, including moderate foraminal stenosis on the left at L2-L3 and on the right at L5-S1. Electronically Signed   By: Feliberto Harts M.D.   On:  03/14/2024 01:19   CT Hip Left Wo Contrast Result Date: 03/12/2024 CLINICAL DATA:  Left hip pain.  Chronic back pain. EXAM: CT OF THE LEFT HIP WITHOUT CONTRAST TECHNIQUE: Multidetector CT imaging of the left hip was performed according to the standard protocol. Multiplanar CT image reconstructions were also generated. RADIATION DOSE REDUCTION: This exam was performed according to the departmental dose-optimization program which includes automated exposure control, adjustment of the mA and/or kV according to patient size and/or use of iterative reconstruction technique. COMPARISON:  Right hip MRI (which includes the left hip on some sequences) dated 05/03/2022 FINDINGS: Bones/Joint/Cartilage Mild deformity of the sacrococcygeal junction suggesting an old healed fracture. No fracture identified. No obvious sclerotic band to indicate stress fracture. No hip effusion. Mild axial degenerative chondral thinning. Ligaments Suboptimally assessed by CT. Muscles and Tendons Gluteus medius and minimus muscular atrophy disproportionate to the generalized muscular atrophy. Small direct left inguinal hernia contains adipose tissue. Soft tissues Sigmoid colon diverticulosis. No sciatic notch impingement or impinging lesion along the visualized part of the sacral plexus. IMPRESSION: 1. No acute findings. 2. Mild deformity of the sacrococcygeal junction suggesting an old healed fracture. 3. Mild axial degenerative chondral thinning in the left hip. 4. Gluteus medius and minimus muscular atrophy disproportionate to the generalized muscular atrophy. 5. Small direct left inguinal hernia contains adipose tissue. 6. Sigmoid colon diverticulosis. Electronically Signed   By: Gaylyn Rong M.D.   On: 03/12/2024 18:46    Labs: CBC: Recent Labs  Lab 03/12/24 1914  WBC 6.6  NEUTROABS 4.4  HGB 11.2*  HCT 33.8*  MCV 94.2  PLT 226   Basic Metabolic Panel: Recent Labs  Lab 03/12/24 1914 03/15/24 0445  NA 132* 133*  K  3.5 4.2  CL 100 99  CO2 24 28  GLUCOSE 113* 101*  BUN 22 24*  CREATININE 0.78  0.83 0.72  CALCIUM 8.9 9.1    CBG: Recent Labs  Lab 03/15/24 1129  GLUCAP 122*    Discharge time spent: greater than 30 minutes.  Signed: Alford Highland, MD [facility] 03/16/2024

## 2024-03-16 NOTE — Progress Notes (Signed)
 Mobility Specialist - Progress Note   03/16/24 0941  Mobility  Activity Ambulated with assistance in hallway;Stood at bedside;Dangled on edge of bed  Level of Assistance Standby assist, set-up cues, supervision of patient - no hands on  Assistive Device Front wheel walker  Distance Ambulated (ft) 100 ft  Activity Response Tolerated well  Mobility Referral Yes  Mobility visit 1 Mobility  Mobility Specialist Start Time (ACUTE ONLY) [id number]  Mobility Specialist Stop Time (ACUTE ONLY) 0940  Mobility Specialist Time Calculation (min) (ACUTE ONLY) 19 min   Pt supine in bed on RA upon arrival. Pt completes bed mobility, STS, and ambulates in hallway Supervision with no physical assistance needed. Pt returns to EOB to eat breakfast with needs in reach.   Terrilyn Saver  Mobility Specialist  03/16/24 9:43 AM

## 2024-03-16 NOTE — Plan of Care (Signed)
  Problem: Education: Goal: Knowledge of General Education information will improve Description: Including pain rating scale, medication(s)/side effects and non-pharmacologic comfort measures Outcome: Adequate for Discharge   Problem: Health Behavior/Discharge Planning: Goal: Ability to manage health-related needs will improve Outcome: Adequate for Discharge   Problem: Clinical Measurements: Goal: Ability to maintain clinical measurements within normal limits will improve Outcome: Adequate for Discharge Goal: Will remain free from infection Outcome: Adequate for Discharge Goal: Diagnostic test results will improve Outcome: Adequate for Discharge Goal: Respiratory complications will improve Outcome: Adequate for Discharge Goal: Cardiovascular complication will be avoided Outcome: Adequate for Discharge   Problem: Activity: Goal: Risk for activity intolerance will decrease Outcome: Adequate for Discharge   Problem: Nutrition: Goal: Adequate nutrition will be maintained Outcome: Adequate for Discharge   Problem: Coping: Goal: Level of anxiety will decrease Outcome: Adequate for Discharge   Problem: Elimination: Goal: Will not experience complications related to bowel motility Outcome: Adequate for Discharge Goal: Will not experience complications related to urinary retention Outcome: Adequate for Discharge   Problem: Coping: Goal: Level of anxiety will decrease Outcome: Adequate for Discharge   Problem: Pain Managment: Goal: General experience of comfort will improve and/or be controlled Outcome: Adequate for Discharge   Problem: Safety: Goal: Ability to remain free from injury will improve Outcome: Adequate for Discharge   Problem: Safety: Goal: Ability to remain free from injury will improve Outcome: Adequate for Discharge   Problem: Skin Integrity: Goal: Risk for impaired skin integrity will decrease Outcome: Adequate for Discharge   Problem: Skin  Integrity: Goal: Risk for impaired skin integrity will decrease Outcome: Adequate for Discharge

## 2024-03-17 ENCOUNTER — Telehealth: Payer: Self-pay

## 2024-03-17 NOTE — Telephone Encounter (Signed)
 Recieved a call from [facility] and they will be providing transportation for this patient via Print production planner.

## 2024-03-17 NOTE — Transitions of Care (Post Inpatient/ED Visit) (Signed)
 03/17/2024  Name: Wanda Martinez MRN: [id number] DOB: Apr 18, 1935  Today's TOC FU Call Status: Today's TOC FU Call Status:: Successful TOC FU Call Completed TOC FU Call Complete Date: 03/17/24 Patient's Name and Date of Birth confirmed.  Transition Care Management Follow-up Telephone Call Date of Discharge: 03/16/24 Discharge Facility: [facility]) Type of Discharge: Inpatient Admission Primary Inpatient Discharge Diagnosis:: Intractable back pain, acute on chronic  Principal problem How have you been since you were released from the hospital?: Same (complaining of back pain) Any questions or concerns?: Yes  Items Reviewed: Did you receive and understand the discharge instructions provided?: No (confusing due to changes and  her meds are prepackaged) Medications obtained,verified, and reconciled?: Yes (Medications Reviewed) Any new allergies since your discharge?: No Dietary orders reviewed?: Yes Type of Diet Ordered:: Reg Heart Healthy Do you have support at home?: Yes People in Home: spouse Name of Support/Comfort Primary Source: Husband Wanda Martinez, Daughter Wanda Martinez will be there for 10 days  Medications Reviewed Today: Medications Reviewed Today     Reviewed by Johnnette Barrios, RN (Registered Nurse) on 03/17/24 at 1234  Med List Status: <None>   Medication Order Taking? Sig Documenting Provider Last Dose Status Informant  acetaminophen (TYLENOL) 325 MG tablet [id number] Yes Take 2 tablets (650 mg total) by mouth every 6 (six) hours as needed for mild pain (pain score 1-3) (or Fever >/= 101). Alford Highland, MD Taking Active   amLODipine (NORVASC) 5 MG tablet [id number] Yes Take 1 tablet (5 mg total) by mouth daily. Alford Highland, MD Taking Active   aspirin 81 MG EC tablet [id number] Yes Take 81 mg by mouth daily as needed. [provider] Taking Active Pharmacy Records  calcium carbonate (OSCAL) 1500 (600 Ca) MG TABS tablet [id number] Yes  Take 1 tablet by mouth daily. [provider] Taking Active Pharmacy Records  citalopram (CELEXA) 10 MG tablet [id number] Yes Take 10 mg by mouth daily. Take long with one 20 mg tablet for total 30 mg once daily [provider] Taking Active Pharmacy Records  citalopram (CELEXA) 20 MG tablet [id number] Yes TAKE 1 TABLET BY MOUTH ONCE DAILY.  Patient taking differently: Take 20 mg by mouth daily. Take along with one 10 mg tablet for total 30 mg once daily   Dale [place], MD Taking Active Pharmacy Records  donepezil (ARICEPT) 10 MG tablet [id number] Yes TAKE ONE TABLET BY MOUTH AT BEDTIME Dale [place], MD Taking Active Pharmacy Records  gabapentin (NEURONTIN) 100 MG capsule [id number] Yes Take 2 capsules (200 mg total) by mouth 3 (three) times daily. Alford Highland, MD Taking Active   Glucosamine-Chondroitin 500-400 MG CAPS [id number] Yes Take 2 capsules by mouth daily. [provider] Taking Active Pharmacy Records  hydrochlorothiazide (HYDRODIURIL) 12.5 MG tablet [id number] Yes Take 1 tablet (12.5 mg total) by mouth every morning. Alford Highland, MD Taking Active   losartan (COZAAR) 50 MG tablet [id number] Yes TAKE ONE TABLET BY MOUTH ONCE DAILY Dale [place], MD Taking Active Pharmacy Records  magnesium oxide (MAG-OX) 400 (240 Mg) MG tablet [id number] Yes TAKE ONE TABLET BY MOUTH EVERY DAY Dale [place], MD Taking Active Pharmacy Records  melatonin 1 MG TABS tablet [id number] Yes Take 3 mg by mouth at bedtime. [provider] Taking Active Pharmacy Records  methocarbamol (ROBAXIN) 500 MG tablet [id number] Yes Take 1 tablet (500 mg total) by mouth every 8 (eight) hours as needed for muscle spasms. Alford Highland, MD Taking Active   metoprolol succinate (TOPROL-XL) 25 MG  24 hr tablet [id number] Yes Take 1 tablet (25 mg total) by mouth 2 (two) times daily. Dale [place], MD Taking Active Pharmacy Records  mirabegron ER [facility]) 50 MG TB24 tablet [id number] Yes  Take 1 tablet (50 mg total) by mouth daily. [name], MD Taking Active Pharmacy Records  Multiple Vitamin (MULTIVITAMIN) tablet [id number] Yes Take 1 tablet by mouth daily. [provider] Taking Active Pharmacy Records  naloxone [facility]) nasal spray 4 mg/0.1 mL [id number] Yes Place 1 spray into the nose as needed for up to 365 doses (for opioid-induced respiratory depresssion). In case of emergency (overdose), spray once into each nostril. If no response within 3 minutes, repeat application and call 911. [name], MD Taking Active Pharmacy Records           Med Note [facility], [place] A   Mon Feb 11, 2024  3:06 PM) WARNING: [place] mandated prescription under the "STOP Act of 2017" requiring reversal medication to be available to patient when taking opioid medication capable of inducing respiratory depression and failure. DO NOT REMOVE!  nitrofurantoin, macrocrystal-monohydrate, (MACROBID) 100 MG capsule [id number] No Take 100 mg by mouth 2 (two) times daily.  Patient not taking: Reported on 03/17/2024   [provider] Not Taking Active Pharmacy Records  Omega-3 1000 MG CAPS [id number] Yes Take by mouth. [provider] Taking Active Pharmacy Records  oxyCODONE (OXY IR/ROXICODONE) 5 MG immediate release tablet [id number] Yes Take 1 tablet (5 mg total) by mouth every 6 (six) hours as needed for severe pain (pain score 7-10) or breakthrough pain (PRN for post-radiofrequency pain). Must last 30 days. [name], MD Taking Active Pharmacy Records           Med Note [facility], [place] A   Mon Feb 11, 2024  3:06 PM) WARNING: Future prescription, NOT a DUPLICATE. DO NOT DELETE. Do not delete during hospital medication reconciliation or at discharge. DO NOT LABEL as "Patient not taking".  pantoprazole (PROTONIX) 40 MG tablet [id number] Yes TAKE ONE TABLET BY MOUTH EVERY DAY Dale [place], MD Taking Active Pharmacy Records  polyethylene glycol (MIRALAX /  GLYCOLAX) 17 g packet [id number] Yes Take 17 g by mouth daily as needed for severe constipation. Alford Highland, MD Taking Active   predniSONE (DELTASONE) 10 MG tablet [id number] Yes 4 tabs po day 1; 3 tabs po day 2,3; 2 tabs po day 4,5; 1 tab po day 6,7; 1/2 tab po day 8,9,10,11 Alford Highland, MD Taking Active   rosuvastatin (CRESTOR) 20 MG tablet [id number] Yes TAKE ONE TABLET BY MOUTH AT BEDTIME Dale , MD Taking Active Pharmacy Records  Med List Note Newman [name], RN 02/11/24 1511): UDS 06/27/23 MR 05/12/24 Medication agreement renewed 2020           Medication reconciliation / review completed based on most recent discharge summary and EHR medication list. Confirmed patient is taking all newly prescribed medications as instructed (any discrepancies are noted in review section)   Patient / Caregiver is aware of any changes to and / or  any dosage adjustments to medication regimen. Patient/ Caregiver denies questions at this time and reports no barriers to medication adherence.   Reviewed the following updates and changes with patient and Pharmacist -Melissa START taking: acetaminophen (TYLENOL) methocarbamol (ROBAXIN) polyethylene glycol (MIRALAX / GLYCOLAX) predniSONE (DELTASONE) CHANGE how you take: amLODipine (NORVASC) gabapentin (NEURONTIN) oxyCODONE (Oxy IR/ROXICODONE) STOP taking: Baclofen 5 MG Tabs doxycycline 100 MG tablet (VIBRA-TABS) Review your updated medication list below.  Home Care and Equipment/Supplies:  Were Home Health Services Ordered?: Yes Name of Home Health Agency:: [facility] Any new equipment or medical supplies ordered?: Yes (3-N-1 BSC) Name of Medical supply agency?: DME Agency: [employer] Were you able to get the equipment/medical supplies?: Yes Do you have any questions related to the use of the equipment/supplies?: No  Functional Questionnaire: Do you need assistance with bathing/showering or dressing?: Yes Do  you need assistance with meal preparation?: Yes Do you need assistance with eating?: No Do you have difficulty maintaining continence: No Do you need assistance with getting out of bed/getting out of a chair/moving?: Yes Do you have difficulty managing or taking your medications?: Yes (uses prepackaged  pill packs)  Follow up appointments reviewed: PCP Follow-up appointment confirmed?: Yes Date of PCP follow-up appointment?: 04/03/24 Follow-up Provider: Malva Limes, MD Specialist Hospital Follow-up appointment confirmed?: Yes Follow-Up Specialty Provider:: [facility]  Cadence David Stall [facility] at  [facility] Wednesday Mar 26, 2024 8:50 AM Laboratory Visit  Friday Mar 28, 2024 9:15 AM (Arrive by 9:00 AM [facility] at  [facility] A [place]  Monday Apr 28, 2024 1:30 PM (Arrive by 1:15 PM) [facility] Urology [place] Injection  Tuesday Apr 29, 2024 2:00 PM [facility] [facility] - A  Dept Of [place]. [facility] Do you need transportation to your follow-up appointment?: No (Spouse drives)  SDOH Interventions Today    Flowsheet Row Most Recent Value  SDOH Interventions   Food Insecurity Interventions Intervention Not Indicated  Housing Interventions Intervention Not Indicated  Transportation Interventions Intervention Not Indicated, Patient Resources (Friends/Family), Payor Benefit  Utilities Interventions Intervention Not Indicated     Patient is at high risk for readmission and/or has history of  high utilization  Discussed VBCI  TOC program and weekly calls to patient to assess condition/status, medication management  and provide support/education as indicated . Patient/ Caregiver voiced understanding and is  agreeable to 30 day program  Her daughter is coming in to stay with her on Thursday for approximately 10 days  Plan  Post hospital Follow  upappontment  with your  PCP within in 1-2 weeks of your hospital discharge date - Appointment 04/03/24  Please take all your medications and your discharge paperwork with you for your next visit with your Primary MD   Please request your Primary MD to go over all hospital tests and procedure/radiological results at the follow up, please ask your Primary MD to get all Hospital records sent to his/her office. Follow-up Plan  [phone] ) Home Health, [facility] [phone] )Palliative Care and [phone])  re: POC, Medication changes  1.Call placed to [facility] with employee Melissa ( related to patient) She verified medications The patient receives her medications in prepackaged pill,packs these will be reviewed and updated for  the patient The Pharmacist will call and speak with the patient.        2. Call placed to [facility] Verified referral [facility] requested SN be added if able for disease and Med management       3. Call aced to [facility] - Per family the patient had a previous referral Per [facility]  -Melania she will reach out to Director Misty Stanley to request a Initial SOC visit be scheduled        4. Keep Pain Management visit 4/3  Based on current information and Insurance plan -  Reviewed benefits available to patient, including details about eligibility options for care if any area of needs were identified.  Reviewed patients ability to access and / or navigating the benefits system..Amb Referral made if indicted , refer to orders section of note for details   Reviewed goals for care Patient verbalizes understanding of instructions and care plan provided. Patient was encouraged to make informed decisions about their care, actively participate in managing their health condition, and implement lifestyle changes as needed to promote independence and self-management of health care   Patient was encouraged to Contact PCP with any  changes in baseline or  medication regimen,  changes in health status  /  well-being, safety concerns, including falls any questions or concerns regarding ongoing medical care, any difficulty obtaining or picking up prescriptions, any changes or worsening in condition- including  symptoms not relieved  with interventions    The patient has been provided with contact information for the care management team and has been advised to call with any health-related questions or concerns. Follow up call  with Care Team  as scheduled,or sooner should any new problems arise.     Susa Loffler , BSN, RN [facility]   VBCI-Population Health RN Care Manager Direct Dial [phone]  Fax: [phone] Website: [email or web address]

## 2024-03-17 NOTE — Patient Instructions (Signed)
 Visit Information  Thank you for taking time to visit with me today. Please don't hesitate to contact me if I can be of assistance to you before our next scheduled telephone appointment.  Our next appointment is by telephone on 03/25/24 at 1:00pm   Following is a copy of your care plan:   Goals Addressed             This Visit's Progress    TOC Care Plan       Current Barriers:  Medication management prepackages pill packs  Provider appointments PCP, Pain Mgmt, Urology, Oncology  Home Health services [facility] and [facility] Palliative  Equipment/DME Walker 3 N 1 BSC  Functional/Safety meds , fall riak   RNCM Clinical Goal(s):  Patient will work with the Care Management team over the next 30 days to address Transition of Care Barriers: Medication Management Provider appointments Home Health services Equipment/DME Functional/Safety take all medications exactly as prescribed and will call provider for medication related questions as evidenced by med management by Pharmacy, Home Health and VBCI CM  attend all scheduled medical appointments: as evidenced by no missed follow-up visits  demonstrate ongoing self health care management ability  as evidenced by increased I dependence and understanding of POC  through collaboration with RN Care manager, provider, and care team.   Interventions: Evaluation of current treatment plan related to  self management and patient's adherence to plan as established by provider  Transitions of Care:  New goal. Durable Medical Equipment (DME) needs assessed with patient/caregiver, reviewed with patient/caregiver, options discussed with patient/caregiver, needs identified and provider notified, orders reviewed, delivery confirmed, and education provided Sick Day Rules Reviewed Doctor Visits  - discussed the importance of doctor visits Communication with [facility]( [phone] ) Home Health, [facility] [phone] )[facility]all[facility] and [phone])  re: POC, Medication changes  Post discharge activity limitations prescribed by provider reviewed Reviewed Signs and symptoms of infection  Patient Goals/Self-Care Activities: Participate in Transition of Care Program/Attend TOC scheduled calls Take all medications as prescribed Attend all scheduled provider appointments Call pharmacy for medication refills 3-7 days in advance of running out of medications Perform all self care activities independently  Call provider office for new concerns or questions  Work with the [facility] Team  to address care coordination needs and will continue to work with the clinical team to address health care and disease management related needs  Follow Up Plan:  Telephone follow up appointment with care management team member scheduled for:  03/25/24 @ 1:00p m with Wanda Gainer RN The patient has been provided with contact information for the care management team and has been advised to call with any health related questions or concerns.          Medication review   Medication reconciliation / review completed based on most recent discharge summary and EHR medication list. Confirmed patient is taking all newly prescribed medications as instructed (any discrepancies are noted in review section)   Patient / Caregiver is aware of any changes to and / or  any dosage adjustments to medication regimen. Patient/ Caregiver denies questions at this time and reports no barriers to medication adherence.   Reviewed the following updates and changes with patient and Pharmacist -Wanda Martinez START taking: acetaminophen (TYLENOL) methocarbamol (ROBAXIN) polyethylene glycol (MIRALAX / GLYCOLAX) predniSONE (DELTASONE) CHANGE how you take: amLODipine (NORVASC) gabapentin (NEURONTIN) oxyCODONE (Oxy IR/ROXICODONE) STOP taking: Baclofen 5 MG Tabs doxycycline 100 MG tablet (VIBRA-TABS) Review your updated medication list below.Patient  will call 911 for  Medical Emergencies or Life -Threatening Symptoms.  Reviewed goals for care Patient/ Caregiver verbalizes understanding of instructions with the plan of care . The  Patient / Caregiver was encouraged to make informed decisions about care, actively participate in managing health conditions, and implement lifestyle changes as needed to promote independence and self-management of healthcare. SDOH screenings have been completed and addressed if indicted.  There are no reported barriers to care.    Follow-up Plan Plan  Post hospital Follow upappontment  with your  PCP within in 1-2 weeks of your hospital discharge date - Appointment 04/03/24  Please take all your medications and your discharge paperwork with you for your next visit with your Primary MD   Please request your Primary MD to go over all hospital tests and procedure/radiological results at the follow up, please ask your Primary MD to get all Hospital records sent to his/her office. Follow-up Plan  [phone] ) Home Health, [facility] [phone] )Palliative Care and [phone])  re: POC, Medication changes  1.Call placed to [facility] with employee Wanda Martinez ( related to patient) She verified medications The patient receives her medications in prepackaged pill,packs these will be reviewed and updated for  the patient The Pharmacist will call and speak with the patient.        2. Call placed to [facility] Verified referral [facility] requested SN be added if able for disease and Med management       3. Call aced to [facility] - Per family the patient had a previous referral Per [facility]  -Wanda Martinez she will reach out to Director Wanda Martinez to request a Initial SOC visit be scheduled        4. Keep Pain Management visit 4/3    [facility]  Please call the care guide team at [phone]  if you need to cancel or reschedule your appointment . For scheduled calls -Three  attempts will be made to reach you -if the scheduled call is missed or  we are unable to reach the you after 3 attempts no additional outreach attempts will be made and the TOC follow-up will be closed .   If you need to speak to a Nurse you may  call me directly at the number below or if I am unavailable,and  your need is urgent  please call the main VBCI number at [phone] and ask to speak with one of the [facility] ( Transition of Care )  Nurses  .  Patient was encouraged to Contact PCP with any changes in baseline or  medication regimen,  changes in health status  /  well-being, safety concerns, including falls any questions or concerns regarding ongoing medical care, any difficulty obtaining or picking up prescriptions, any changes or worsening in condition- including  symptoms not relieved  with interventions                                                                            Additionally, If you experience worsening of your symptoms, develop shortness of breath, If you are experiencing a medical emergency,  develop suicidal or homicidal thoughts you must seek medical attention immediately by  calling 911 or report to your local emergency department or urgent care.   If you have a non-emergency medical problem during routine business hours, please contact your provider's office and ask to speak with a nurse.       Please take the time to read instructions/literature along with the possible adverse reactions/side effects for all the Medicines that have been prescribed to you. Only take newly prescribed  Medications after you have completely understood and accept all the possible adverse reactions/side effects.   Do not take more than prescribed Medications for  Pain, Sleep and Anxiety. Do not drive when taking Pain medications or sleep aid/ insomnia  medications It is not advisable to combine anxiety, sleep and pain medications without talking with your primary care practitioner    If you are  experiencing a Mental Health or Behavioral Health Crisis or need someone to talk to Please call the Suicide and Crisis Lifeline: 988 You may also call the [place] National Suicide Prevention Lifeline: 574-201-2292 or TTY: 5176953354 TTY 702-820-8444) to talk to a trained counselor.  You may call the Behavioral Health Crisis Line at (726)709-8407, at any time, 24 hours a day, 7 days a week- however If you are in danger or need immediate medical attention, call 911.   If you would like help to quit smoking, call 1-800-QUIT-NOW ( 825-589-0375) OR Espaol: 1-855-Djelo-Ya (3-474-259-5638) o para ms informacin haga clic aqu or Text READY to 756-433 to register via text.   The patient has been provided with contact information for the care management team and has been advised to call with any health-related questions or concerns. Follow up call  with Care Team  as scheduled,or sooner should any new problems arise.  Susa Loffler , BSN, RN    VBCI-Population Health RN Care Manager Direct Dial [phone]  Website: [email or web address]

## 2024-03-20 ENCOUNTER — Ambulatory Visit: Attending: Pain Medicine | Admitting: Pain Medicine

## 2024-03-20 ENCOUNTER — Ambulatory Visit
Admission: RE | Admit: 2024-03-20 | Discharge: 2024-03-20 | Disposition: A | Discharge status: Home / Self Care | Source: Ambulatory Visit | Attending: Pain Medicine | Admitting: Pain Medicine

## 2024-03-20 ENCOUNTER — Encounter: Payer: Self-pay | Admitting: Pain Medicine

## 2024-03-20 VITALS — BP 110/68 | HR 72 | Temp 97.0°F | Resp 16 | Ht 63.0 in | Wt 156.0 lb

## 2024-03-20 DIAGNOSIS — M545 Low back pain, unspecified: Secondary | ICD-10-CM | POA: Diagnosis not present

## 2024-03-20 DIAGNOSIS — M47817 Spondylosis without myelopathy or radiculopathy, lumbosacral region: Secondary | ICD-10-CM

## 2024-03-20 DIAGNOSIS — M5459 Other low back pain: Secondary | ICD-10-CM

## 2024-03-20 DIAGNOSIS — M47816 Spondylosis without myelopathy or radiculopathy, lumbar region: Secondary | ICD-10-CM

## 2024-03-20 DIAGNOSIS — G8929 Other chronic pain: Secondary | ICD-10-CM

## 2024-03-20 DIAGNOSIS — R937 Abnormal findings on diagnostic imaging of other parts of musculoskeletal system: Secondary | ICD-10-CM | POA: Diagnosis not present

## 2024-03-20 MED ORDER — ROPIVACAINE HCL 2 MG/ML IJ SOLN
18.0000 mL | Freq: Once | INTRAMUSCULAR | Status: AC
  Administered 2024-03-20: 18 mL via PERINEURAL
  Filled 2024-03-20: qty 20

## 2024-03-20 MED ORDER — LIDOCAINE HCL 2 % IJ SOLN
20.0000 mL | Freq: Once | INTRAMUSCULAR | Status: AC
  Administered 2024-03-20: 400 mg
  Filled 2024-03-20: qty 40

## 2024-03-20 MED ORDER — TRIAMCINOLONE ACETONIDE 40 MG/ML IJ SUSP
80.0000 mg | Freq: Once | INTRAMUSCULAR | Status: AC
Start: 2024-03-20 — End: 2024-03-20
  Administered 2024-03-20: 80 mg
  Filled 2024-03-20: qty 2

## 2024-03-20 MED ORDER — PENTAFLUOROPROP-TETRAFLUOROETH EX AERO
INHALATION_SPRAY | Freq: Once | CUTANEOUS | Status: AC
  Administered 2024-03-20: 30 via TOPICAL

## 2024-03-20 MED ORDER — MIDAZOLAM HCL 2 MG/2ML IJ SOLN
0.5000 mg | Freq: Once | INTRAMUSCULAR | Status: AC
  Administered 2024-03-20: 1 mg via INTRAVENOUS
  Filled 2024-03-20: qty 2

## 2024-03-20 NOTE — Progress Notes (Signed)
 PROVIDER NOTE: Interpretation of information contained herein should be left to medically-trained personnel. Specific patient instructions are provided elsewhere under "Patient Instructions" section of medical record. This document was created in part using STT-dictation technology, any transcriptional errors that may result from this process are unintentional.  Patient: Wanda Martinez Type: Established DOB: 1935/12/08 MRN: [id number] PCP: Dale Richfield, MD  Service: Procedure DOS: 03/20/2024 Setting: Ambulatory Location: Ambulatory outpatient facility Delivery: Face-to-face Provider: Oswaldo Done, MD Specialty: Interventional Pain Management Specialty designation: 09 Location: Outpatient facility Ref. Prov.: Dale Perkasie, MD       Interventional Therapy   Type: Lumbar Facet, Medial Branch Block(s)            Laterality: Bilateral  Level: L3, L4, L5, and S1 Medial Branch Level(s). Injecting these levels blocks the L4-5 and L5-S1 lumbar facet joints. Imaging: Fluoroscopic guidance         Anesthesia: Local anesthesia (1-2% Lidocaine) Anxiolysis: IV Versed 2.0 mg Sedation: No Sedation                       DOS: 03/20/2024 Performed by: Oswaldo Done, MD  Primary Purpose: Diagnostic/Therapeutic Indications: Low back pain severe enough to impact quality of life or function. 1. Chronic low back pain (1ry area of Pain) (Bilateral) (R>L) w/o sciatica   2. Lumbar facet hypertrophy (Multilevel) (Bilateral)   3. Lumbar facet joint pain   4. Lumbar facet syndrome (Bilateral) (R>L)   5. Spondylosis without myelopathy or radiculopathy, lumbosacral region   6. Abnormal MRI, lumbar spine (08/23/2022)    NAS-11 Pain score:   Pre-procedure: 6 /10   Post-procedure: 0-No pain/10     Position / Prep / Materials:  Position: Prone  Prep solution: ChloraPrep (2% chlorhexidine gluconate and 70% isopropyl alcohol) Area Prepped: Posterolateral Lumbosacral Spine (Wide prep: From  the lower border of the scapula down to the end of the tailbone and from flank to flank.)  Materials:  Tray: Block Needle(s):  Type: Spinal  Gauge (G): 22  Length: 3.5-in Qty: 4     H&P (Pre-op Assessment):  Wanda Martinez is a 89 y.o. (year old), female patient, seen today for interventional treatment. She  has a past surgical history that includes Tonsillectomy and adenoidectomy (1979); Vesicovaginal fistula closure w/ TAH (1983); Breast surgery (1986); Breast enhancement surgery (1987); Breast implant removal; Breast implant removal (Right, 08/29/2012); Mastectomy (08/2012); Abdominal hysterectomy; Colonoscopy with propofol (N/A, 09/13/2016); Breast biopsy (2013); and Blepharoplasty. Wanda Martinez has a current medication list which includes the following prescription(s): acetaminophen, amlodipine, aspirin ec, calcium carbonate, citalopram, citalopram, donepezil, gabapentin, glucosamine-chondroitin, hydrochlorothiazide, losartan, magnesium oxide, melatonin, methocarbamol, metoprolol succinate, mirabegron er, multivitamin, naloxone, omega-3, [START ON 04/12/2024] oxycodone, pantoprazole, polyethylene glycol, prednisone, rosuvastatin, and nitrofurantoin (macrocrystal-monohydrate). Her primarily concern today is the Shoulder Pain (Right ) and Back Pain (L>R/)  Initial Vital Signs:  Pulse/HCG Rate: 72ECG Heart Rate: 78 Temp: (!) 97 F (36.1 C) Resp: 16 BP: 106/63 SpO2: 100 %  BMI: Estimated body mass index is 27.63 kg/m as calculated from the following:   Height as of this encounter: 5\' 3"  (1.6 m).   Weight as of this encounter: 156 lb (70.8 kg).  Risk Assessment: Allergies: Reviewed. She is allergic to sulfa antibiotics and vesicare [solifenacin].  Allergy Precautions: None required Coagulopathies: Reviewed. None identified.  Blood-thinner therapy: None at this time Active Infection(s): Reviewed. None identified. Wanda Martinez is afebrile  Site Confirmation: Wanda Martinez was asked to confirm the  procedure and laterality before  marking the site Procedure checklist: Completed Consent: Before the procedure and under the influence of no sedative(s), amnesic(s), or anxiolytics, the patient was informed of the treatment options, risks and possible complications. To fulfill our ethical and legal obligations, as recommended by the American Medical Association's Code of Ethics, I have informed the patient of my clinical impression; the nature and purpose of the treatment or procedure; the risks, benefits, and possible complications of the intervention; the alternatives, including doing nothing; the risk(s) and benefit(s) of the alternative treatment(s) or procedure(s); and the risk(s) and benefit(s) of doing nothing. The patient was provided information about the general risks and possible complications associated with the procedure. These may include, but are not limited to: failure to achieve desired goals, infection, bleeding, organ or nerve damage, allergic reactions, paralysis, and death. In addition, the patient was informed of those risks and complications associated to Spine-related procedures, such as failure to decrease pain; infection (i.e.: Meningitis, epidural or intraspinal abscess); bleeding (i.e.: epidural hematoma, subarachnoid hemorrhage, or any other type of intraspinal or peri-dural bleeding); organ or nerve damage (i.e.: Any type of peripheral nerve, nerve root, or spinal cord injury) with subsequent damage to sensory, motor, and/or autonomic systems, resulting in permanent pain, numbness, and/or weakness of one or several areas of the body; allergic reactions; (i.e.: anaphylactic reaction); and/or death. Furthermore, the patient was informed of those risks and complications associated with the medications. These include, but are not limited to: allergic reactions (i.e.: anaphylactic or anaphylactoid reaction(s)); adrenal axis suppression; blood sugar elevation that in diabetics may result  in ketoacidosis or comma; water retention that in patients with history of congestive heart failure may result in shortness of breath, pulmonary edema, and decompensation with resultant heart failure; weight gain; swelling or edema; medication-induced neural toxicity; particulate matter embolism and blood vessel occlusion with resultant organ, and/or nervous system infarction; and/or aseptic necrosis of one or more joints. Finally, the patient was informed that Medicine is not an exact science; therefore, there is also the possibility of unforeseen or unpredictable risks and/or possible complications that may result in a catastrophic outcome. The patient indicated having understood very clearly. We have given the patient no guarantees and we have made no promises. Enough time was given to the patient to ask questions, all of which were answered to the patient's satisfaction. Wanda Martinez has indicated that she wanted to continue with the procedure. Attestation: I, the ordering provider, attest that I have discussed with the patient the benefits, risks, side-effects, alternatives, likelihood of achieving goals, and potential problems during recovery for the procedure that I have provided informed consent. Date  Time: 03/20/2024 11:14 AM  Pre-Procedure Preparation:  Monitoring: As per clinic protocol. Respiration, ETCO2, SpO2, BP, heart rate and rhythm monitor placed and checked for adequate function Safety Precautions: Patient was assessed for positional comfort and pressure points before starting the procedure. Time-out: I initiated and conducted the "Time-out" before starting the procedure, as per protocol. The patient was asked to participate by confirming the accuracy of the "Time Out" information. Verification of the correct person, site, and procedure were performed and confirmed by me, the nursing staff, and the patient. "Time-out" conducted as per Joint Commission's Universal Protocol  (UP.01.01.01). Time: 1212 Start Time: 1212 hrs.  Description of Procedure:          Laterality: (see above) Targeted Levels: (see above)  Safety Precautions: Aspiration looking for blood return was conducted prior to all injections. At no point did we inject any substances,  as a needle was being advanced. Before injecting, the patient was told to immediately notify me if she was experiencing any new onset of "ringing in the ears, or metallic taste in the mouth". No attempts were made at seeking any paresthesias. Safe injection practices and needle disposal techniques used. Medications properly checked for expiration dates. SDV (single dose vial) medications used. After the completion of the procedure, all disposable equipment used was discarded in the proper designated medical waste containers. Local Anesthesia: Protocol guidelines were followed. The patient was positioned over the fluoroscopy table. The area was prepped in the usual manner. The time-out was completed. The target area was identified using fluoroscopy. A 12-in long, straight, sterile hemostat was used with fluoroscopic guidance to locate the targets for each level blocked. Once located, the skin was marked with an approved surgical skin marker. Once all sites were marked, the skin (epidermis, dermis, and hypodermis), as well as deeper tissues (fat, connective tissue and muscle) were infiltrated with a small amount of a short-acting local anesthetic, loaded on a 10cc syringe with a 25G, 1.5-in  Needle. An appropriate amount of time was allowed for local anesthetics to take effect before proceeding to the next step. Local Anesthetic: Lidocaine 2.0% The unused portion of the local anesthetic was discarded in the proper designated containers. Technical description of process:   L3 Medial Branch Nerve Block (MBB): The target area for the L3 medial branch is at the junction of the postero-lateral aspect of the superior articular process and  the superior, posterior, and medial edge of the transverse process of L4. Under fluoroscopic guidance, a Quincke needle was inserted until contact was made with os over the superior postero-lateral aspect of the pedicular shadow (target area). After negative aspiration for blood, 0.5 mL of the nerve block solution was injected without difficulty or complication. The needle was removed intact. L4 Medial Branch Nerve Block (MBB): The target area for the L4 medial branch is at the junction of the postero-lateral aspect of the superior articular process and the superior, posterior, and medial edge of the transverse process of L5. Under fluoroscopic guidance, a Quincke needle was inserted until contact was made with os over the superior postero-lateral aspect of the pedicular shadow (target area). After negative aspiration for blood, 0.5 mL of the nerve block solution was injected without difficulty or complication. The needle was removed intact. L5 Medial Branch Nerve Block (MBB): The target area for the L5 medial branch is at the junction of the postero-lateral aspect of the superior articular process and the superior, posterior, and medial edge of the sacral ala. Under fluoroscopic guidance, a Quincke needle was inserted until contact was made with os over the superior postero-lateral aspect of the pedicular shadow (target area). After negative aspiration for blood, 0.5 mL of the nerve block solution was injected without difficulty or complication. The needle was removed intact. S1 Medial Branch Nerve Block (MBB): The target area for the S1 medial branch is at the posterior and inferior 6 o'clock position of the L5-S1 facet joint. Under fluoroscopic guidance, the Quincke needle inserted for the L5 MBB was redirected until contact was made with os over the inferior and postero aspect of the sacrum, at the 6 o' clock position under the L5-S1 facet joint (Target area). After negative aspiration for blood, 0.5 mL of  the nerve block solution was injected without difficulty or complication. The needle was removed intact.  Once the entire procedure was completed, the treated area was cleaned, making  sure to leave some of the prepping solution back to take advantage of its long term bactericidal properties.         Illustration of the posterior view of the lumbar spine and the posterior neural structures. Laminae of L2 through S1 are labeled. DPRL5, dorsal primary ramus of L5; DPRS1, dorsal primary ramus of S1; DPR3, dorsal primary ramus of L3; FJ, facet (zygapophyseal) joint L3-L4; I, inferior articular process of L4; LB1, lateral branch of dorsal primary ramus of L1; IAB, inferior articular branches from L3 medial branch (supplies L4-L5 facet joint); IBP, intermediate branch plexus; MB3, medial branch of dorsal primary ramus of L3; NR3, third lumbar nerve root; S, superior articular process of L5; SAB, superior articular branches from L4 (supplies L4-5 facet joint also); TP3, transverse process of L3.   Facet Joint Innervation (* possible contribution)  L1-2 T12, L1 (L2*)  Medial Branch  L2-3 L1, L2 (L3*)         "          "  L3-4 L2, L3 (L4*)         "          "  L4-5 L3, L4 (L5*)         "          "  L5-S1 L4, L5, S1          "          "    Vitals:   03/20/24 1204 03/20/24 1210 03/20/24 1217 03/20/24 1225  BP: (!) 144/76 133/73 132/69 110/68  Pulse:      Resp: 18 16 18 16   Temp:      TempSrc:      SpO2: 99% 98% 95% 95%  Weight:      Height:         End Time: 1217 hrs.  Imaging Guidance (Spinal):          Type of Imaging Technique: Fluoroscopy Guidance (Spinal) Indication(s): Fluoroscopy guidance for needle placement to enhance accuracy in procedures requiring precise needle localization for targeted delivery of medication in or near specific anatomical locations not easily accessible without such real-time imaging assistance. Exposure Time: Please see nurses notes. Contrast: None  used. Fluoroscopic Guidance: I was personally present during the use of fluoroscopy. "Tunnel Vision Technique" used to obtain the best possible view of the target area. Parallax error corrected before commencing the procedure. "Direction-depth-direction" technique used to introduce the needle under continuous pulsed fluoroscopy. Once target was reached, antero-posterior, oblique, and lateral fluoroscopic projection used confirm needle placement in all planes. Images permanently stored in EMR. Interpretation: No contrast injected. I personally interpreted the imaging intraoperatively. Adequate needle placement confirmed in multiple planes. Permanent images saved into the patient's record.  Post-operative Assessment:  Post-procedure Vital Signs:  Pulse/HCG Rate: 7272 Temp: (!) 97 F (36.1 C) Resp: 16 BP: 110/68 SpO2: 95 %  EBL: None  Complications: No immediate post-treatment complications observed by team, or reported by patient.  Note: The patient tolerated the entire procedure well. A repeat set of vitals were taken after the procedure and the patient was kept under observation following institutional policy, for this type of procedure. Post-procedural neurological assessment was performed, showing return to baseline, prior to discharge. The patient was provided with post-procedure discharge instructions, including a section on how to identify potential problems. Should any problems arise concerning this procedure, the patient was given instructions to immediately contact us, at any time, without hesitation. In any  case, we plan to contact the patient by telephone for a follow-up status report regarding this interventional procedure.  Comments:  No additional relevant information.  Plan of Care (POC)  Orders:  Orders Placed This Encounter  Procedures   LUMBAR FACET(MEDIAL BRANCH NERVE BLOCK) MBNB    Scheduling Instructions:     Procedure: Lumbar facet block (AKA.: Lumbosacral medial  branch nerve block)     Side: Bilateral     Level: L3-4, L4-5, L5-S1, and TBD Facets (L2, L3, L4, L5, S1, and TBD Medial Branch Nerves)     Sedation: Patient's choice.     Timeframe: Today    Where will this procedure be performed?:   [facility]   [facility] C-ARM 1-60 MIN NO REPORT    Intraoperative interpretation by procedural physician at [facility].    Standing Status:   Standing    Number of Occurrences:   1    Reason for exam::   Assistance in needle guidance and placement for procedures requiring needle placement in or near specific anatomical locations not easily accessible without such assistance.   Informed Consent Details: Physician/Practitioner Attestation; Transcribe to consent form and obtain patient signature    Nursing Order: Transcribe to consent form and obtain patient signature. Note: Always confirm laterality of pain with [name], before procedure.    Physician/Practitioner attestation of informed consent for procedure/surgical case:   I, the physician/practitioner, attest that I have discussed with the patient the benefits, risks, side effects, alternatives, likelihood of achieving goals and potential problems during recovery for the procedure that I have provided informed consent.    Procedure:   Lumbar Facet Block  under fluoroscopic guidance    Physician/Practitioner performing the procedure:   Sophya Vanblarcom A. Laban Emperor MD    Indication/Reason:   Low Back Pain, with our without leg pain, due to Facet Joint Arthralgia (Joint Pain) Spondylosis (Arthritis of the Spine), without myelopathy or radiculopathy (Nerve Damage).   Provide equipment / supplies at bedside    Procedure tray: "Block Tray" (Disposable  single use) Skin infiltration needle: Regular 1.5-in, 25-G, (x1) Block Needle type: Spinal Amount/quantity: 4 Size: Regular (3.5-inch) Gauge: 22G    Standing Status:   Standing    Number of Occurrences:   1    Specify:   Block Tray   Saline  lock IV    Have LR [phone] mL available and administer at 125 mL/hr if patient becomes hypotensive.    Standing Status:   Standing    Number of Occurrences:   1   Chronic Opioid Analgesic:   Oxycodone IR 5 mg, 1 tab PO q 8 hrs (15 mg/day of oxycodone) MME/day: 22.5 mg/day.   Medications ordered for procedure: Meds ordered this encounter  Medications   lidocaine (XYLOCAINE) 2 % (with pres) injection 400 mg   pentafluoroprop-tetrafluoroeth (GEBAUERS) aerosol   midazolam (VERSED) injection 0.5-2 mg    Make sure Flumazenil is available in the pyxis when using this medication. If oversedation occurs, administer 0.2 mg IV over 15 sec. If after 45 sec no response, administer 0.2 mg again over 1 min; may repeat at 1 min intervals; not to exceed 4 doses (1 mg)   ropivacaine (PF) 2 mg/mL (0.2%) (NAROPIN) injection 18 mL   triamcinolone acetonide (KENALOG-40) injection 80 mg   Medications administered: We administered lidocaine, pentafluoroprop-tetrafluoroeth, midazolam, ropivacaine (PF) 2 mg/mL (0.2%), and triamcinolone acetonide.  See the medical record for exact dosing, route, and time of administration.  Follow-up plan:  Return in about 2 weeks (around 04/03/2024) for (Face2F), (PPE).       Interventional Therapies  Risk Factors  Considerations  Medical Comorbidities:  Parkinson's disease  Hx right breast cancer  OIC  SOB  CKD Stage 3b  GERD  depression  osteopenia/osteoporosis  PSVT  Memory impairment      Planned  Pending:   Therapeutic right sided suprascapular nerve RFA #1    Under consideration:  Therapeutic right sided suprascapular nerve RFA #1  Possible intrathecal pump trial and permanent implant    Completed:   Diagnostic/therapeutic right suprascapular NB x2 (10/16/2023) (100/100/75 x 5 days/0) Therapeutic right trapezius TPI x4 (07/17/2023) (100/100/0/0)  Therapeutic right thoracic TPI x2 (10/17/2018) (DKFUA)  Therapeutic right QL & ESM TPI x1 (02/12/2018)    Diagnostic right SI joint Blk x3 (12/21/2020)  (DKFUA)  Therapeutic right SI joint RFA x4 (03/01/2023) (100/100/70/70)   Therapeutic right lumbar facet MBB (FCT: L3-4,L4-5,L5-S1) x6 (01/15/2024) (100/100/85/85)  Therapeutic right lumbar facet MBB (FCT: L4-5, L5-S1) x7 (11/01/2023) (100/100/70/R:70)  Therapeutic right lumbar facet MBB (FCT: L5-S1) x1 (08/29/2022) (100/100/100/0)  Therapeutic right lumbar facet RFA x4 (03/01/2023) (100/100/70/70)   Therapeutic bilateral L5 and L2 TFESI x1 (07/03/2023) (100/90/50/L:100R:20))  Therapeutic right L3 and L4 TFESI x1 (07/27/2022) (100/100/75/50)  Therapeutic right L5 TFESI x4 (02/14/2022) (100/100/70/75)  Therapeutic right S1 TFESI x1 (11/04/2020) (100/100/20/<50)   Therapeutic right L3-4 LESI x2 (11/04/2020) (100/100/20/<50)  Therapeutic right L4-5 LESI x2 (03/30/2022) (100/100/75/75)  Therapeutic right L5-S1 LESI x2 (12/04/2019) (100/100/50/75)  Therapeutic midline L2-3 LESI x1 (10/04/2017) (100/100/85/>50)   Diagnostic bilateral superficial trochanteric bursa injec. x1 (04/30/2018) (DKFUA)  Diagnostic right IA hip injection x1 (02/13/2019) (DKFUA)   Diagnostic/therapeutic right IA steroid knee inj. x5 (07/31/2023) (100/100/100/80)  Therapeutic right (Zilretta) knee injection x3 (11/13/2023) (100/100/75/75) Therapeutic right (Monovisc) knee injection x1 (06/07/2021) (100/100/100/75)  Therapeutic right pes anserine bursa inj. x2 (12/25/2023) (100/100/50/50)  Therapeutic left thoracic TPI x1 (10/17/2018) (DKFUA)  Diagnostic left SI joint Blk x2 (04/12/2023) (100/90/20/<25)   Therapeutic left lumbar facet MBB (L2-S1) (FCT: L3-4,L4-5,L5-S1) x6 (01/15/2024) (100/100/85/85)  Therapeutic left lumbar facet MBB (FCT: L4-5, L5-S1) x7 (11/01/2023) (100/100/70/L: 85)  Therapeutic left lumbar facet MBB (L4-S1) (FCT: L5-S1) x1 (08/29/2022) (100/100/100/100)  Therapeutic left lumbar facet RFA x2 (11/30/2022) (100/60/90/>75)    Therapeutic  Palliative  (PRN) options:   Do not schedule any procedures without a proper preprocedure evaluation.   Completed by other providers:   Therapeutic right knee (ORTHOVISC) inj. x3 (05/18/2023, 05/23/2023, 05/30/2023) by Aram Candela, MD [facility]) Therapeutic left knee (Kenalog) inj. x1 (08/25/2020) by Novella Olive, MD [facility])   Pharmacotherapy  Nonopioids transfer 12/21/2020: Gabapentin       Recent Visits Date Type Provider Dept  02/11/24 Office Visit [name], MD [facility]  02/05/24 Procedure visit [name], MD [facility]  01/29/24 Office Visit [name], MD [facility]  01/15/24 Procedure visit [name], MD [facility]  12/25/23 Office Visit [name], MD [facility]  Showing recent visits within past 90 days and meeting all other requirements Today's Visits Date Type Provider Dept  03/20/24 Procedure visit [name], MD [facility]  Showing today's visits and meeting all other requirements Future Appointments Date Type Provider Dept  04/08/24 Appointment [name], MD [facility]  05/07/24 Appointment [name], MD [facility]  Showing future appointments within next 90 days and meeting all other requirements  Disposition: Discharge home  Discharge (Date  Time): 03/20/2024; 1226 hrs.   Primary Care Physician: Dale Stephens, MD Location: [facility] Note by: Oswaldo Done, MD (TTS technology used. I apologize for any typographical errors that were not detected and corrected.) Date: 03/20/2024; Time: 1:31 PM  Disclaimer:  Medicine is not an Visual merchandiser. The only guarantee in medicine is that nothing is guaranteed. It is important to note that the decision to proceed with this intervention was based on the information collected from the patient. The Data and conclusions were drawn from  the patient's questionnaire, the interview, and the physical examination. Because the information was provided in large part by the patient, it cannot be guaranteed that it has not been purposely or unconsciously manipulated. Every effort has been made to obtain as much relevant data as possible for this evaluation. It is important to note that the conclusions that lead to this procedure are derived in large part from the available data. Always take into account that the treatment will also be dependent on availability of resources and existing treatment guidelines, considered by other Pain Management Practitioners as being common knowledge and practice, at the time of the intervention. For Medico-Legal purposes, it is also important to point out that variation in procedural techniques and pharmacological choices are the acceptable norm. The indications, contraindications, technique, and results of the above procedure should only be interpreted and judged by a Board-Certified Interventional Pain Specialist with extensive familiarity and expertise in the same exact procedure and technique.

## 2024-03-20 NOTE — Patient Instructions (Addendum)
Post-procedure Information What to expect: Most procedures involve the use of a local anesthetic (numbing medicine), and a steroid (anti-inflammatory medicine).  The local anesthetics may cause temporary numbness and weakness of the legs or arms, depending on the location of the block. This numbness/weakness may last 4-6 hours, depending on the local anesthetic used. In rare instances, it can last up to 24 hours. While numb, you must be very careful not to injure the extremity.  After any procedure, you could expect the pain to get better within 15-20 minutes. This relief is temporary and may last 4-6 hours. Once the local anesthetics wears off, you could experience discomfort, possibly more than usual, for up to 10 (ten) days. In the case of radiofrequencies, it may last up to 6 weeks. Surgeries may take up to 8 weeks for the healing process. The discomfort is due to the irritation caused by needles going through skin and muscle. To minimize the discomfort, we recommend using ice the first day, and heat from then on. The ice should be applied for 15 minutes on, and 15 minutes off. Keep repeating this cycle until bedtime. Avoid applying the ice directly to the skin, to prevent frostbite. Heat should be used daily, until the pain improves (4-10 days). Be careful not to burn yourself.  Occasionally you may experience muscle spasms or cramps. These occur as a consequence of the irritation caused by the needle sticks to the muscle and the blood that will inevitably be lost into the surrounding muscle tissue. Blood tends to be very irritating to tissues, which tend to react by going into spasm. These spasms may start the same day of your procedure, but they may also take days to develop. This late onset type of spasm or cramp is usually caused by electrolyte imbalances triggered by the steroids, at the level of the kidney. Cramps and spasms tend to respond well to muscle relaxants, multivitamins (some are  triggered by the procedure, but may have their origins in vitamin deficiencies), and "Gatorade", or any sports drinks that can replenish any electrolyte imbalances. (If you are a diabetic, ask your pharmacist to get you a sugar-free brand.) Warm showers or baths may also be helpful. Stretching exercises are highly recommended. General Instructions:  Be alert for signs of possible infection: redness, swelling, heat, red streaks, elevated temperature, and/or fever. These typically appear 4 to 6 days after the procedure. Immediately notify your doctor if you experience unusual bleeding, difficulty breathing, or loss of bowel or bladder control. If you experience increased pain, do not increase your pain medicine intake, unless instructed by your pain physician. Post-Procedure Care:  Be careful in moving about. Muscle spasms in the area of the injection may occur. Applying ice or heat to the area is often helpful. The incidence of spinal headaches after epidural injections ranges between 1.4% and 6%. If you develop a headache that does not seem to respond to conservative therapy, please let your physician know. This can be treated with an epidural blood patch.   Post-procedure numbness or redness is to be expected, however it should average 4 to 6 hours. If numbness and weakness of your extremities begins to develop 4 to 6 hours after your procedure, and is felt to be progressing and worsening, immediately contact your physician.   Diet:  If you experience nausea, do not eat until this sensation goes away. If you had a "Stellate Ganglion Block" for upper extremity "Reflex Sympathetic Dystrophy", do not eat or drink until your   hoarseness goes away. In any case, always start with liquids first and if you tolerate them well, then slowly progress to more solid foods. Activity:  For the first 4 to 6 hours after the procedure, use caution in moving about as you may experience numbness and/or weakness. Use caution in  cooking, using household electrical appliances, and climbing steps. If you need to reach your Doctor call our office: [phone] Monday-Thursday 8:00 am - 4:00 PM    Fridays: Closed     In case of an emergency: In case of emergency, call 911 or go to the nearest emergency room and have the physician there call us.  Interpretation of Procedure Every nerve block has two components: a diagnostic component, and a treatment component. Unrealistic expectations are the most common causes of "perceived failure".  In a perfect world, a single nerve block should be able to completely and permanently eliminate the pain. Sadly, the world is not perfect.  Most pain management nerve blocks are performed using local anesthetics and steroids. Steroids are responsible for any long-term benefit that you may experience. Their purpose is to decrease any chronic swelling that may exist in the area. Steroids begin to work immediately after being injected. However, most patients will not experience any benefits until 5 to 10 days after the injection, when the swelling has come down to the point where they can tell a difference. Steroids will only help if there is swelling to be treated. As such, they can assist with the diagnosis. If effective, they suggest an inflammatory component to the pain, and if ineffective, they rule out inflammation as the main cause or component of the problem. If the problem is one of mechanical compression, you will get no benefit from those steroids.   In the case of local anesthetics, they have a crucial role in the diagnosis of your condition. Most will begin to work within15 to 20 minutes after injection. The duration will depend on the type used (short- vs. Long-acting). It is of outmost importance that patients keep tract of their pain, after the procedure. To assist with this matter, a "Post-procedure Pain Diary" is provided. Make sure to complete it and to bring it back to your  follow-up appointment.  As long as the patient keeps accurate, detailed records of their symptoms after every procedure, and returns to have those interpreted, every procedure will provide us with invaluable information. Even a block that does not provide the patient with any relief, will always provide us with information about the mechanism and the origin of the pain. The only time a nerve block can be considered a waste of time is when patients do not keep track of the results, or do not keep their post-procedure appointment.  Reporting the results back to your physician The Pain Score  Pain is a subjective complaint. It cannot be seen, touched, or measured. We depend entirely on the patient's report of the pain in order to assess your condition and treatment. To evaluate the pain, we use a pain scale, where "0" means "No Pain", and a "10" is "the worst possible pain that you can even imagine" (i.e. something like been eaten alive by a shark or being torn apart by a lion).   You will frequently be asked to rate your pain. Please be as accurate, remember that medical decisions will be based on your responses. Please do not rate your pain above a 10. Doing so is actually interpreted as "symptom magnification" (exaggeration), as   well as lack of understanding with regards to the scale. To put this into perspective, when you tell us that your pain is at a 10 (ten), what you are saying is that there is nothing we can do to make this pain any worse. (Carefully think about that.)  ____________________________________________________________________________________________  Post-Procedure Discharge Instructions  Instructions: Apply ice:  Purpose: This will minimize any swelling and discomfort after procedure.  When: Day of procedure, as soon as you get home. How: Fill a plastic sandwich bag with crushed ice. Cover it with a small towel and apply to injection site. How long: (15 min on, 15 min off) Apply  for 15 minutes then remove x 15 minutes.  Repeat sequence on day of procedure, until you go to bed. Apply heat:  Purpose: To treat any soreness and discomfort from the procedure. When: Starting the next day after the procedure. How: Apply heat to procedure site starting the day following the procedure. How long: May continue to repeat daily, until discomfort goes away. Food intake: Start with clear liquids (like water) and advance to regular food, as tolerated.  Physical activities: Keep activities to a minimum for the first 8 hours after the procedure. After that, then as tolerated. Driving: If you have received any sedation, be responsible and do not drive. You are not allowed to drive for 24 hours after having sedation. Blood thinner: (Applies only to those taking blood thinners) You may restart your blood thinner 6 hours after your procedure. Insulin: (Applies only to Diabetic patients taking insulin) As soon as you can eat, you may resume your normal dosing schedule. Infection prevention: Keep procedure site clean and dry. Shower daily and clean area with soap and water. Post-procedure Pain Diary: Extremely important that this be done correctly and accurately. Recorded information will be used to determine the next step in treatment. For the purpose of accuracy, follow these rules: Evaluate only the area treated. Do not report or include pain from an untreated area. For the purpose of this evaluation, ignore all other areas of pain, except for the treated area. After your procedure, avoid taking a long nap and attempting to complete the pain diary after you wake up. Instead, set your alarm clock to go off every hour, on the hour, for the initial 8 hours after the procedure. Document the duration of the numbing medicine, and the relief you are getting from it. Do not go to sleep and attempt to complete it later. It will not be accurate. If you received sedation, it is likely that you were given a  medication that may cause amnesia. Because of this, completing the diary at a later time may cause the information to be inaccurate. This information is needed to plan your care. Follow-up appointment: Keep your post-procedure follow-up evaluation appointment after the procedure (usually 2 weeks for most procedures, 6 weeks for radiofrequencies). DO NOT FORGET to bring you pain diary with you.   Expect: (What should I expect to see with my procedure?) From numbing medicine (AKA: Local Anesthetics): Numbness or decrease in pain. You may also experience some weakness, which if present, could last for the duration of the local anesthetic. Onset: Full effect within 15 minutes of injected. Duration: It will depend on the type of local anesthetic used. On the average, 1 to 8 hours.  From steroids (Applies only if steroids were used): Decrease in swelling or inflammation. Once inflammation is improved, relief of the pain will follow. Onset of benefits: Depends on the  amount of swelling present. The more swelling, the longer it will take for the benefits to be seen. In some cases, up to 10 days. Duration: Steroids will stay in the system x 2 weeks. Duration of benefits will depend on multiple posibilities including persistent irritating factors. Side-effects: If present, they may typically last 2 weeks (the duration of the steroids). Frequent: Cramps (if they occur, drink Gatorade and take over-the-counter Magnesium 450-500 mg once to twice a day); water retention with temporary weight gain; increases in blood sugar; decreased immune system response; increased appetite. Occasional: Facial flushing (red, warm cheeks); mood swings; menstrual changes. Uncommon: Long-term decrease or suppression of natural hormones; bone thinning. (These are more common with higher doses or more frequent use. This is why we prefer that our patients avoid having any injection therapies in other practices.)  Very Rare: Severe mood  changes; psychosis; aseptic necrosis. From procedure: Some discomfort is to be expected once the numbing medicine wears off. This should be minimal if ice and heat are applied as instructed.  Call if: (When should I call?) You experience numbness and weakness that gets worse with time, as opposed to wearing off. New onset bowel or bladder incontinence. (Applies only to procedures done in the spine)  Emergency Numbers: Durning business hours (Monday - Thursday, 8:00 AM - 4:00 PM) (Friday, 9:00 AM - 12:00 Noon): [phone] After hours: [phone] NOTE: If you are having a problem and are unable connect with, or to talk to a provider, then go to your nearest urgent care or emergency department. If the problem is serious and urgent, please call 911. ____________________________________________________________________________________________

## 2024-03-21 ENCOUNTER — Telehealth: Payer: Self-pay

## 2024-03-21 DIAGNOSIS — M25561 Pain in right knee: Secondary | ICD-10-CM | POA: Diagnosis not present

## 2024-03-21 DIAGNOSIS — M25511 Pain in right shoulder: Secondary | ICD-10-CM | POA: Diagnosis not present

## 2024-03-21 DIAGNOSIS — M461 Sacroiliitis, not elsewhere classified: Secondary | ICD-10-CM | POA: Diagnosis not present

## 2024-03-21 DIAGNOSIS — Z853 Personal history of malignant neoplasm of breast: Secondary | ICD-10-CM | POA: Diagnosis not present

## 2024-03-21 DIAGNOSIS — E871 Hypo-osmolality and hyponatremia: Secondary | ICD-10-CM | POA: Diagnosis not present

## 2024-03-21 DIAGNOSIS — M545 Low back pain, unspecified: Secondary | ICD-10-CM | POA: Diagnosis not present

## 2024-03-21 DIAGNOSIS — Z9181 History of falling: Secondary | ICD-10-CM | POA: Diagnosis not present

## 2024-03-21 DIAGNOSIS — D649 Anemia, unspecified: Secondary | ICD-10-CM | POA: Diagnosis not present

## 2024-03-21 DIAGNOSIS — I10 Essential (primary) hypertension: Secondary | ICD-10-CM | POA: Diagnosis not present

## 2024-03-21 DIAGNOSIS — G8929 Other chronic pain: Secondary | ICD-10-CM | POA: Diagnosis not present

## 2024-03-21 DIAGNOSIS — M7062 Trochanteric bursitis, left hip: Secondary | ICD-10-CM | POA: Diagnosis not present

## 2024-03-21 DIAGNOSIS — K5900 Constipation, unspecified: Secondary | ICD-10-CM | POA: Diagnosis not present

## 2024-03-21 NOTE — Telephone Encounter (Signed)
 Attempt to contact patient for post-procedure f/u. No answer and LVM.

## 2024-03-24 ENCOUNTER — Telehealth: Payer: Self-pay | Admitting: Pain Medicine

## 2024-03-24 NOTE — Telephone Encounter (Signed)
 The RFA of shoulder was scheduled last time, she wanted the back done instead. Do you want to schedule the RFA?

## 2024-03-25 ENCOUNTER — Other Ambulatory Visit: Payer: Self-pay

## 2024-03-25 ENCOUNTER — Emergency Department

## 2024-03-25 ENCOUNTER — Telehealth: Payer: Self-pay | Admitting: Pain Medicine

## 2024-03-25 ENCOUNTER — Emergency Department
Admission: EM | Admit: 2024-03-25 | Discharge: 2024-03-26 | Disposition: A | Discharge status: Home / Self Care | Attending: Emergency Medicine | Admitting: Emergency Medicine

## 2024-03-25 DIAGNOSIS — S46912A Strain of unspecified muscle, fascia and tendon at shoulder and upper arm level, left arm, initial encounter: Secondary | ICD-10-CM | POA: Insufficient documentation

## 2024-03-25 DIAGNOSIS — S4992XA Unspecified injury of left shoulder and upper arm, initial encounter: Secondary | ICD-10-CM | POA: Diagnosis present

## 2024-03-25 DIAGNOSIS — M25512 Pain in left shoulder: Secondary | ICD-10-CM | POA: Diagnosis not present

## 2024-03-25 DIAGNOSIS — I10 Essential (primary) hypertension: Secondary | ICD-10-CM | POA: Diagnosis not present

## 2024-03-25 DIAGNOSIS — X501XXA Overexertion from prolonged static or awkward postures, initial encounter: Secondary | ICD-10-CM | POA: Diagnosis not present

## 2024-03-25 MED ORDER — MORPHINE SULFATE (PF) 2 MG/ML IV SOLN
2.0000 mg | Freq: Once | INTRAVENOUS | Status: AC
  Administered 2024-03-25: 2 mg via INTRAVENOUS
  Filled 2024-03-25: qty 1

## 2024-03-25 MED ORDER — MELOXICAM 15 MG PO TABS: 15.0000 mg | ORAL_TABLET | Freq: Every day | ORAL | 0 refills | Status: DC

## 2024-03-25 MED ORDER — ACETAMINOPHEN 500 MG PO TABS
1000.0000 mg | ORAL_TABLET | Freq: Once | ORAL | Status: AC
  Administered 2024-03-25: 1000 mg via ORAL

## 2024-03-25 MED ORDER — ONDANSETRON HCL 4 MG/2ML IJ SOLN
4.0000 mg | Freq: Once | INTRAMUSCULAR | Status: AC
  Administered 2024-03-25: 4 mg via INTRAVENOUS
  Filled 2024-03-25: qty 2

## 2024-03-25 NOTE — Patient Instructions (Signed)
 Visit Information  Thank you for taking time to visit with me today. Please don't hesitate to contact me if I can be of assistance to you before our next scheduled telephone appointment.  Our next appointment is by telephone on Tuesday April 15th at 1:00pm  Following is a copy of your care plan:   Goals Addressed             This Visit's Progress    TOC Care Plan   On track    Current Barriers: (reviewed 03/25/24) Medication management prepackages pill packs  Provider appointments PCP, Pain Mgmt, Urology, Oncology  Home Health services [facility] and [facility]  Equipment/DME Walker 3 N 1 BSC  Functional/Safety meds , fall riak   RNCM Clinical Goal(s): (reviewed 03/25/24) Patient will work with the Care Management team over the next 30 days to address Transition of Care Barriers: Medication Management Provider appointments Home Health services Equipment/DME Functional/Safety take all medications exactly as prescribed and will call provider for medication related questions as evidenced by med management by Pharmacy, Home Health and [facility] CM  attend all scheduled medical appointments: as evidenced by no missed follow-up visits  demonstrate ongoing self health care management ability  as evidenced by increased I dependence and understanding of POC  through collaboration with RN Care manager, provider, and care team.  Pain management including injections, medications, heat/ice, rest, distraction therapy  Interventions: (reviewed 03/25/24) Evaluation of current treatment plan related to  self management and patient's adherence to plan as established by provider  Transitions of Care:  Goal on track:  Yes. Durable Medical Equipment (DME) needs assessed with patient/caregiver, reviewed with patient/caregiver, options discussed with patient/caregiver, needs identified and provider notified, orders reviewed, delivery confirmed, and education provided Sick Day Rules Reviewed Doctor  Visits  - discussed the importance of doctor visits Communication with [phone] ) Home Health, [facility] [phone] )Palliative Care and [phone])  re: POC, Medication changes  Post discharge activity limitations prescribed by provider reviewed Reviewed Signs and symptoms of infection  Patient Goals/Self-Care Activities:  (reviewed 03/25/24) Participate in Transition of Care Program/Attend TOC scheduled calls Take all medications as prescribed Attend all scheduled provider appointments Call pharmacy for medication refills 3-7 days in advance of running out of medications Perform all self care activities independently  Call provider office for new concerns or questions  Work with the [facility] Team  to address care coordination needs and will continue to work with the clinical team to address health care and disease management related needs  Follow Up Plan:  Telephone follow up appointment with care management team member scheduled for:  04/01/24 @ 1:00p m with Redge Gainer RN The patient has been provided with contact information for the care management team and has been advised to call with any health related questions or concerns.          The patient verbalized understanding of instructions, educational materials, and care plan provided today and agreed to receive a mailed copy of patient instructions, educational materials, and care plan.   The patient has been provided with contact information for the care management team and has been advised to call with any health related questions or concerns.   Please call the care guide team at [phone] if you need to cancel or reschedule your appointment.   Please call the Suicide and Crisis Lifeline: 988 call the [place] National Suicide Prevention Lifeline: 7144788141 or TTY: 636-778-4537 TTY (938)759-3609) to talk to a trained counselor  if you are experiencing a Mental Health or Behavioral  Health Crisis or need someone to talk to. Deidre Ala, BSN, RN [place]  [facility] - [employer] [facility] RN Care Manager [phone]

## 2024-03-25 NOTE — Telephone Encounter (Signed)
 Husband called for wife asking if she can come in to get some shots in her shoulder. Says it is hurting her a lot.  Kori spoke with Dr Laban Emperor and he says No it is too early. Please call and speak with patient to let them know.Thank you

## 2024-03-25 NOTE — Progress Notes (Signed)
 Reviewed. She contacted pain clinic. Appears to be in ER now.

## 2024-03-25 NOTE — ED Triage Notes (Signed)
 Pt to ED via EMS from home, pt reports she pushed herself out of the tub on Sunday and since has had left shoulder pain. Pt reports pai is worse with movement.

## 2024-03-25 NOTE — ED Provider Notes (Signed)
 [facility] Provider Note    Event Date/Time   First MD Initiated Contact with Patient 03/25/24 2224     (approximate)   History   Shoulder Pain    HPI  Wanda Martinez is a 89 y.o. female    with a past medical history of chronic low back pain, anemia,  who presents to the ED complaining of left shoulder pain.   According to the patient the pain started a week ago when she pulled her self from the tub.  Patient states had oxycodone 2 hours ago.  Patient states that she is getting lumbar injection with steroids.      Physical Exam   Triage Vital Signs: ED Triage Vitals  Encounter Vitals Group     BP 03/25/24 1920 (!) 161/107     Systolic BP Percentile --      Diastolic BP Percentile --      Pulse Rate 03/25/24 1920 87     Resp 03/25/24 1920 20     Temp 03/25/24 1920 98 F (36.7 C)     Temp src --      SpO2 03/25/24 1920 99 %     Weight 03/25/24 1918 150 lb (68 kg)     Height 03/25/24 1918 5\' 3"  (1.6 m)     Head Circumference --      Peak Flow --      Pain Score 03/25/24 1918 10     Pain Loc --      Pain Education --      Exclude from Growth Chart --     Most recent vital signs: Vitals:   03/25/24 1920 03/25/24 1920  BP:  (!) 161/107  Pulse:  87  Resp:  20  Temp: 98 F (36.7 C)   SpO2:  99%     Constitutional: Alert, NAD. Able to speak in complete sentences without cough or dyspnea  Eyes: Conjunctivae are normal.  Head: Atraumatic. Nose: No congestion/rhinnorhea. Mouth/Throat: Mucous membranes are moist.   Neck: Painless ROM. Supple. No JVD, nodes, thyromegaly  Cardiovascular:   Good peripheral circulation.RRR no murmurs, gallops, rubs  Respiratory: Normal respiratory effort.  No retractions. Clear to auscultation bilaterally without wheezing or crackles  Gastrointestinal: Soft and nontender.  Musculoskeletal:  no deformity Left shoulder: Skin is intact no ecchymosis no hematomas.  Presence of pain patch in the left  scapula.  Left scapular area tender to palpation.  Patient can do flexion and extension of the  left upper extremity.  Sensation is intact, strength 5/5 Neurologic:  MAE spontaneously. No gross focal neurologic deficits are appreciated.  Skin:  Skin is warm, dry and intact. No rash noted. Psychiatric: Mood and affect are normal. Speech and behavior are normal.    ED Results / Procedures / Treatments   Labs (all labs ordered are listed, but only abnormal results are displayed) Labs Reviewed  BASIC METABOLIC PANEL WITH GFR  CBC WITH DIFFERENTIAL/PLATELET     EKG     RADIOLOGY I independently reviewed and interpreted imaging and agree with radiologists findings.      PROCEDURES:  Critical Care performed:   Procedures   MEDICATIONS ORDERED IN ED: Medications  morphine (PF) 2 MG/ML injection 2 mg (has no administration in time range)  ondansetron (ZOFRAN) injection 4 mg (has no administration in time range)  acetaminophen (TYLENOL) tablet 1,000 mg (1,000 mg Oral Given 03/25/24 1922)   Clinical Course as of 03/25/24 2322  Tue Mar 25, 2024  2246 DG Shoulder Left [AE]  2247  No fracture or dislocation of the left shoulder. 2. Minor acromioclavicular degenerative spurring.   [AE]    Clinical Course User Index [AE] Gladys Damme, PA-C    IMPRESSION / MDM / ASSESSMENT AND PLAN / ED COURSE  I reviewed the triage vital signs and the nursing notes.  Differential diagnosis includes, but is not limited to, fracture, dislocation, muscle strain  Patient's presentation is most consistent with acute complicated illness / injury requiring diagnostic workup.   Patient's diagnosis is consistent with left shoulder strain. I independently reviewed and interpreted imaging and agree with radiologists findings ruling out fracture or dislocation.. I did review the patient's allergies and medications.The patient is in stable and satisfactory condition for discharge home  Patient will  be discharged home with prescriptions for naproxen. Patient is to follow up with orthopedics, (Dr. Joice Lofts) as needed or otherwise directed. Patient is given ED precautions to return to the ED for any worsening or new symptoms. Discussed plan of care with patient, answered all of patient's questions, Patient agreeable to plan of care. Advised patient to take medications according to the instructions on the label. Discussed possible side effects of new medications. Patient verbalized understanding.    FINAL CLINICAL IMPRESSION(S) / ED DIAGNOSES   Final diagnoses:  Strain of left shoulder, initial encounter     Rx / DC Orders   ED Discharge Orders          Ordered    meloxicam (MOBIC) 15 MG tablet  Daily        03/25/24 2316             Note:  This document was prepared using Dragon voice recognition software and may include unintentional dictation errors.   Gladys Damme, PA-C 03/25/24 2322    Sharman Cheek, MD 03/28/24 [phone]

## 2024-03-25 NOTE — Patient Outreach (Signed)
 Transition of Care week 2  Visit Note  03/25/2024  Name: Wanda Martinez MRN: [id number]          DOB: 09/11/1935  Situation: Patient enrolled in [facility] 30-day program. Visit completed with Romie Levee by telephone.   Background:   Initial Transition Care Management Follow-up Telephone Call    Past Medical History:  Diagnosis Date   Acute postoperative pain 10/25/2017   Anemia    Arm pain 07/26/2015   Arthritis    Arthritis, degenerative 03/26/2014   Back pain 11/01/2013   Bladder infection 06/2018   Breast cancer (HCC)    Masectomy - left - 1986    Breast cancer [facility])    Mastectomy-right -2014   CHEST PAIN 04/29/2010   Qualifier: Diagnosis of  By: Laural Benes RN, Erika     Chronic cystitis    Cystocele 02/22/2013   Cystocele, midline 08/19/2013   Degeneration of intervertebral disc of lumbosacral region 03/26/2014   DYSPNEA 04/29/2010   Qualifier: Diagnosis of  By: Laural Benes RN, Erika     Enthesopathy of hip 03/26/2014   GERD (gastroesophageal reflux disease)    Hiatal hernia    HTN (hypertension)    Hypokalemia 06/2018   Hyponatremia 06/2018   LBP (low back pain) 03/26/2014   Neck pain 11/01/2013   Parkinson disease (HCC)    Pneumonia 06/2018   Sinusitis 02/07/2015   Skin lesions 07/12/2014   Urinary incontinence    mixed     Assessment: TOC Outreach completed to the patient today. She is in the bed with shoulder pain. She did have a multiple level facet block last week which she reports her some but now she has pulled something in her shoulder and is in pain at a level 8. She has taken her pain medication and is resting but is still hurting. Encouraged the patient to call her pain provider and discuss current pain level and treatment options. The patient reports that she has depression related to this. She is on medication. She has limited mobility currently which is causing some depressions as well. The plan is to discuss a spinal stimulator. The patient will try some heat/ice. She  follows up with PCP 04/03/24. Patient Reported Symptoms:  Cognitive @FLOW ([id number]))@  Neurological Weakness    HEENT No symptoms reported    Cardiovascular No symptoms reported    Respiratory No symptoms reported    Endocrine No symptoms reported    Gastrointestinal No symptoms reported    Genitourinary Frequency, Urgency    Integumentary No symptoms reported    Musculoskeletal Difficulty walking, Weakness, Muscle pain, Unsteady gait    Psychosocial Depression - if selected complete PHQ 2-9     There were no vitals filed for this visit.  Medications Reviewed Today     Reviewed by Redge Gainer, RN (Case Manager) on 03/25/24 at 1328  Med List Status: <None>   Medication Order Taking? Sig Documenting Provider Last Dose Status Informant  acetaminophen (TYLENOL) 325 MG tablet [id number] No Take 2 tablets (650 mg total) by mouth every 6 (six) hours as needed for mild pain (pain score 1-3) (or Fever >/= 101). Alford Highland, MD Taking Active   amLODipine (NORVASC) 5 MG tablet [id number] No Take 1 tablet (5 mg total) by mouth daily. Alford Highland, MD Taking Active   aspirin 81 MG EC tablet [id number] No Take 81 mg by mouth daily as needed. [provider] Taking Active Pharmacy Records  calcium carbonate (OSCAL) 1500 (600 Ca) MG TABS tablet [id number] No  Take 1 tablet by mouth daily. [provider] Taking Active Pharmacy Records  citalopram (CELEXA) 10 MG tablet [id number] No Take 10 mg by mouth daily. Take long with one 20 mg tablet for total 30 mg once daily [provider] Taking Active Pharmacy Records  citalopram (CELEXA) 20 MG tablet [id number] No TAKE 1 TABLET BY MOUTH ONCE DAILY.  Patient taking differently: Take 20 mg by mouth daily. Take along with one 10 mg tablet for total 30 mg once daily   Dale [place], MD Taking Active Pharmacy Records  donepezil (ARICEPT) 10 MG tablet [id number] No TAKE ONE TABLET BY MOUTH AT BEDTIME Dale [place],  MD Taking Active Pharmacy Records  gabapentin (NEURONTIN) 100 MG capsule [id number] No Take 2 capsules (200 mg total) by mouth 3 (three) times daily. Alford Highland, MD Taking Active   Glucosamine-Chondroitin 500-400 MG CAPS [id number] No Take 2 capsules by mouth daily. [provider] Taking Active Pharmacy Records  hydrochlorothiazide (HYDRODIURIL) 12.5 MG tablet [id number] No Take 1 tablet (12.5 mg total) by mouth every morning. Alford Highland, MD Taking Active   losartan (COZAAR) 50 MG tablet [id number] No TAKE ONE TABLET BY MOUTH ONCE DAILY Dale [place], MD Taking Active Pharmacy Records  magnesium oxide (MAG-OX) 400 (240 Mg) MG tablet [id number] No TAKE ONE TABLET BY MOUTH EVERY DAY Dale [place], MD Taking Active Pharmacy Records  melatonin 1 MG TABS tablet [id number] No Take 3 mg by mouth at bedtime. [provider] Taking Active Pharmacy Records  methocarbamol (ROBAXIN) 500 MG tablet [id number] No Take 1 tablet (500 mg total) by mouth every 8 (eight) hours as needed for muscle spasms. Alford Highland, MD Taking Active   metoprolol succinate (TOPROL-XL) 25 MG 24 hr tablet [id number] No Take 1 tablet (25 mg total) by mouth 2 (two) times daily. Dale , MD Taking Active Pharmacy Records  mirabegron ER [facility]) 50 MG TB24 tablet [id number] No Take 1 tablet (50 mg total) by mouth daily. [name], MD Taking Active Pharmacy Records  Multiple Vitamin (MULTIVITAMIN) tablet [id number] No Take 1 tablet by mouth daily. [provider] Taking Active Pharmacy Records  naloxone [facility]) nasal spray 4 mg/0.1 mL [id number] No Place 1 spray into the nose as needed for up to 365 doses (for opioid-induced respiratory depresssion). In case of emergency (overdose), spray once into each nostril. If no response within 3 minutes, repeat application and call 911. [name], MD Taking Active Pharmacy Records           Med Note [facility], [place] A   Mon Feb 11, 2024   3:06 PM) WARNING: Deputy mandated prescription under the "STOP Act of 2017" requiring reversal medication to be available to patient when taking opioid medication capable of inducing respiratory depression and failure. DO NOT REMOVE!  Omega-3 1000 MG CAPS [id number] No Take by mouth. [provider] Taking Active Pharmacy Records  oxyCODONE (OXY IR/ROXICODONE) 5 MG immediate release tablet [id number] No Take 1 tablet (5 mg total) by mouth every 6 (six) hours as needed for severe pain (pain score 7-10) or breakthrough pain (PRN for post-radiofrequency pain). Must last 30 days. [name], MD Taking Active Pharmacy Records           Med Note [facility], [place] A   Mon Feb 11, 2024  3:06 PM) WARNING: Future prescription, NOT a DUPLICATE. DO NOT DELETE. Do not delete during hospital medication reconciliation or at discharge. DO NOT LABEL as "Patient not taking".  pantoprazole (PROTONIX) 40 MG tablet [id number] No  TAKE ONE TABLET BY MOUTH EVERY DAY Dale [place], MD Taking Active Pharmacy Records  polyethylene glycol (MIRALAX / GLYCOLAX) 17 g packet [id number] No Take 17 g by mouth daily as needed for severe constipation. Alford Highland, MD Taking Active   predniSONE (DELTASONE) 10 MG tablet [id number] No 4 tabs po day 1; 3 tabs po day 2,3; 2 tabs po day 4,5; 1 tab po day 6,7; 1/2 tab po day 8,9,10,11 Alford Highland, MD Taking Active   rosuvastatin (CRESTOR) 20 MG tablet [id number] No TAKE ONE TABLET BY MOUTH AT BEDTIME Dale [place], MD Taking Active Pharmacy Records  Med List Note Newman [name], RN 02/11/24 1511): UDS 06/27/23 MR 05/12/24 Medication agreement renewed 2020            Recommendation:    PCP Follow-up Specialty provider follow-up 04/08/24 with the Pain Management clinic Adequate rest and distraction Alternate heat and ice to the shoulder Contact Pain Management for recommendations for breakthrough pain Mobility as tolerated Monitor constipation daily  due to opiods  Follow Up Plan:   Telephone follow-up in 1 week, April15th at 1:00pm   Routine follow-up and on-going assessment evaluation and education of disease processes, and recommended interventions for both chronic and acute medical conditions, will occur during each weekly visit during [facility] 30-day Program Outreach calls along with ongoing review of symptoms, medication reviews and reconciliation. Any updates, inconsistencies, discrepancies or acute care concerns will be addressed on the Care Plan and routed to the correct Practitioner if indicated.   Deidre Ala, BSN, RN [place]  [facility] - [employer] [facility] RN Care Manager [phone]

## 2024-03-25 NOTE — Telephone Encounter (Signed)
 Daughter just called stating her mother can hardly walk again and asking for more or different medications for her mother. Please call for advice a long acting pain med and 2nd med every 4 hours.

## 2024-03-25 NOTE — Discharge Instructions (Addendum)
 You have been diagnosed with strain of left shoulder.  Please take meloxicam 1 tablet by mouth daily with breakfast.  Please call Dr. Joice Lofts and make an appointment for a follow-up.  Please come back to ED or go to your PCP if you have new symptoms or symptoms worsen

## 2024-03-26 ENCOUNTER — Ambulatory Visit: Attending: Pain Medicine | Admitting: Pain Medicine

## 2024-03-26 ENCOUNTER — Ambulatory Visit: Admitting: Medical

## 2024-03-26 DIAGNOSIS — D649 Anemia, unspecified: Secondary | ICD-10-CM | POA: Diagnosis not present

## 2024-03-26 DIAGNOSIS — M25512 Pain in left shoulder: Secondary | ICD-10-CM

## 2024-03-26 DIAGNOSIS — G894 Chronic pain syndrome: Secondary | ICD-10-CM

## 2024-03-26 DIAGNOSIS — S43402A Unspecified sprain of left shoulder joint, initial encounter: Secondary | ICD-10-CM

## 2024-03-26 DIAGNOSIS — M461 Sacroiliitis, not elsewhere classified: Secondary | ICD-10-CM | POA: Diagnosis not present

## 2024-03-26 DIAGNOSIS — G8929 Other chronic pain: Secondary | ICD-10-CM | POA: Diagnosis not present

## 2024-03-26 DIAGNOSIS — R413 Other amnesia: Secondary | ICD-10-CM

## 2024-03-26 DIAGNOSIS — Z9181 History of falling: Secondary | ICD-10-CM | POA: Diagnosis not present

## 2024-03-26 DIAGNOSIS — K5900 Constipation, unspecified: Secondary | ICD-10-CM | POA: Diagnosis not present

## 2024-03-26 DIAGNOSIS — M25561 Pain in right knee: Secondary | ICD-10-CM | POA: Diagnosis not present

## 2024-03-26 DIAGNOSIS — M545 Low back pain, unspecified: Secondary | ICD-10-CM | POA: Diagnosis not present

## 2024-03-26 DIAGNOSIS — Z853 Personal history of malignant neoplasm of breast: Secondary | ICD-10-CM | POA: Diagnosis not present

## 2024-03-26 DIAGNOSIS — E871 Hypo-osmolality and hyponatremia: Secondary | ICD-10-CM | POA: Diagnosis not present

## 2024-03-26 DIAGNOSIS — M7062 Trochanteric bursitis, left hip: Secondary | ICD-10-CM | POA: Diagnosis not present

## 2024-03-26 DIAGNOSIS — I10 Essential (primary) hypertension: Secondary | ICD-10-CM | POA: Diagnosis not present

## 2024-03-26 DIAGNOSIS — M25511 Pain in right shoulder: Secondary | ICD-10-CM | POA: Diagnosis not present

## 2024-03-26 LAB — BASIC METABOLIC PANEL WITH GFR
Anion gap: 8 (ref 5–15)
BUN: 31 mg/dL — ABNORMAL HIGH (ref 8–23)
CO2: 21 mmol/L — ABNORMAL LOW (ref 22–32)
Calcium: 8.8 mg/dL — ABNORMAL LOW (ref 8.9–10.3)
Chloride: 100 mmol/L (ref 98–111)
Creatinine, Ser: 0.7 mg/dL (ref 0.44–1.00)
GFR, Estimated: >60 mL/min (ref >60)
Glucose, Bld: 94 mg/dL (ref 70–99)
Potassium: 3.1 mmol/L — ABNORMAL LOW (ref 3.5–5.1)
Sodium: 129 mmol/L — ABNORMAL LOW (ref 135–145)

## 2024-03-26 LAB — CBC WITH DIFFERENTIAL/PLATELET
Abs Immature Granulocytes: 0.16 10*3/uL — ABNORMAL HIGH (ref 0.00–0.07)
Basophils Absolute: 0 10*3/uL (ref 0.0–0.1)
Basophils Relative: 0 %
Eosinophils Absolute: 0 10*3/uL (ref 0.0–0.5)
Eosinophils Relative: 0 %
HCT: 35.3 % — ABNORMAL LOW (ref 36.0–46.0)
Hemoglobin: 12.2 g/dL (ref 12.0–15.0)
Immature Granulocytes: 1 %
Lymphocytes Relative: 15 %
Lymphs Abs: 1.6 10*3/uL (ref 0.7–4.0)
MCH: 30.9 pg (ref 26.0–34.0)
MCHC: 34.6 g/dL (ref 30.0–36.0)
MCV: 89.4 fL (ref 80.0–100.0)
Monocytes Absolute: 0.6 10*3/uL (ref 0.1–1.0)
Monocytes Relative: 6 %
Neutro Abs: 8.8 10*3/uL — ABNORMAL HIGH (ref 1.7–7.7)
Neutrophils Relative %: 78 %
Platelets: 207 10*3/uL (ref 150–400)
RBC: 3.95 MIL/uL (ref 3.87–5.11)
RDW: 13.3 % (ref 11.5–15.5)
WBC: 11.3 10*3/uL — ABNORMAL HIGH (ref 4.0–10.5)
nRBC: 0 % (ref 0.0–0.2)

## 2024-03-26 NOTE — Patient Instructions (Signed)

## 2024-03-26 NOTE — Progress Notes (Unsigned)
 PROVIDER NOTE: Interpretation of information contained herein should be left to medically-trained personnel. Specific patient instructions are provided elsewhere under "Patient Instructions" section of medical record. This document was created in part using AI and STT-dictation technology, any transcriptional errors that may result from this process are unintentional.  Patient: Wanda Martinez  Service: E/M   PCP: Dale [place], MD  DOB: Jan 07, 1935  DOS: 03/26/2024  Provider: Oswaldo Done, MD  MRN: [id number]  Delivery: Virtual Visit  Specialty: Interventional Pain Management  Type: Established Patient  Setting: Ambulatory outpatient facility  Specialty designation: 09  Referring Prov.: Dale [place], MD  Location: Remote location       Virtual Encounter - Pain Management PROVIDER NOTE: Information contained herein reflects review and annotations entered in association with encounter. Interpretation of such information and data should be left to medically-trained personnel. Information provided to patient can be located elsewhere in the medical record under "Patient Instructions". Document created using STT-dictation technology, any transcriptional errors that may result from process are unintentional.    Contact & Pharmacy Preferred: [phone] Home: [phone] (home) Mobile: [phone] (mobile) E-mail: No e-mail address on record  [facility] - [place], [place] - [address] [place] [address] Phone: [phone] Fax: [phone]   Pre-screening  Wanda Martinez offered "in-person" vs "virtual" encounter. She indicated preferring virtual for this encounter.   Reason COVID-19*  Social distancing based on CDC and AMA recommendations.   I contacted Wanda Martinez on 03/26/2024 via telephone.      I clearly identified myself as Oswaldo Done, MD. I verified that I was speaking with the correct person using two identifiers (Name:  Wanda Martinez, and date of birth: 1935/05/13).  Consent I sought verbal advanced consent from Wanda Martinez for virtual visit interactions. I informed Wanda Martinez of possible security and privacy concerns, risks, and limitations associated with providing "not-in-person" medical evaluation and management services. I also informed Wanda Martinez of the availability of "in-person" appointments. Finally, I informed her that there would be a charge for the virtual visit and that she could be  personally, fully or partially, financially responsible for it. Wanda Martinez expressed understanding and agreed to proceed.   Historic Elements   Wanda Martinez is a 89 y.o. year old, female patient evaluated today after our last contact on 03/25/2024. Wanda Martinez  has a past medical history of Acute postoperative pain (10/25/2017), Anemia, Arm pain (07/26/2015), Arthritis, Arthritis, degenerative (03/26/2014), Back pain (11/01/2013), Bladder infection (06/2018), Breast cancer (HCC), Breast cancer (HCC), CHEST PAIN (04/29/2010), Chronic cystitis, Cystocele (02/22/2013), Cystocele, midline (08/19/2013), Degeneration of intervertebral disc of lumbosacral region (03/26/2014), DYSPNEA (04/29/2010), Enthesopathy of hip (03/26/2014), GERD (gastroesophageal reflux disease), Hiatal hernia, HTN (hypertension), Hypokalemia (06/2018), Hyponatremia (06/2018), LBP (low back pain) (03/26/2014), Neck pain (11/01/2013), Parkinson disease (HCC), Pneumonia (06/2018), Sinusitis (02/07/2015), Skin lesions (07/12/2014), and Urinary incontinence. She also  has a past surgical history that includes Tonsillectomy and adenoidectomy (1979); Vesicovaginal fistula closure w/ TAH (1983); Breast surgery (1986); Breast enhancement surgery (1987); Breast implant removal; Breast implant removal (Right, 08/29/2012); Mastectomy (08/2012); Abdominal hysterectomy; Colonoscopy with propofol (N/A, 09/13/2016); Breast biopsy (2013); and Blepharoplasty. Wanda Martinez has a  current medication list which includes the following prescription(s): acetaminophen, amlodipine, aspirin ec, calcium carbonate, citalopram, citalopram, donepezil, gabapentin, glucosamine-chondroitin, hydrochlorothiazide, losartan, magnesium oxide, melatonin, meloxicam, methocarbamol, metoprolol succinate, mirabegron er, multivitamin, naloxone, omega-3, [START ON 04/12/2024] oxycodone, pantoprazole, polyethylene glycol, prednisone, and rosuvastatin. She  reports that she has  never smoked. She has never been exposed to tobacco smoke. She has never used smokeless tobacco. She reports current drug use. She reports that she does not drink alcohol. Wanda Martinez is allergic to sulfa antibiotics and vesicare [solifenacin].  BMI: Estimated body mass index is 26.57 kg/m as calculated from the following:   Height as of 03/25/24: 5\' 3"  (1.6 m).   Weight as of 03/25/24: 150 lb (68 kg). Last encounter: 03/04/2024. Last procedure: 03/20/2024.  HPI  Today, she is being contacted for new problems.  The patient called requesting this virtual visit indicating that she was having a lot of pain in her shoulder.  My staff assumed that she was referring to the pain in the right shoulder that we have been treating for her and that we had her schedule to undergo the radiofrequency ablation.  We had her scheduled for that radiofrequency ablation on 03/20/2024 but when she came in she again asked to change the procedure to a treatment for her lower back pain.  Surprisingly, the pain that she is complaining about is that of left shoulder pain, which is acute in nature and she had never reported that before.  When asked she indicated that her son had installed some grab bars in her bathroom and while she was using them, she ended up pulling a muscle in the left shoulder, approximately 1 week ago.  This pain is located on the posterior aspect of the shoulder, however the area of the shoulder blade.  She went to the emergency room yesterday where  she had x-rays done that came back completely negative.  She was diagnosed with a shoulder sprain and given muscle relaxants.  Today she indicates that her pain is terrible and it is not getting any better.  Since this is acute pain I recommended that she use some modalities such as ice (15 minutes on and 15 minutes off) alternating with heat.  She was also instructed to take 1 Tylenol every 4 hours as needed with a maximum of 6/day.  She was informed that she could combine that with one 200 mg ibuprofen, as long as she took it with some food.  She was also encouraged to use the muscle relaxant that she was given and she was instructed not to increase the dose on her oxycodone.  One concern that I have with her is that over time we have increased the oxycodone rather than her pain improving she has been complaining of the pain getting worse which makes me think that she may be experiencing opioid hyperalgesia from the use of the oxycodone.  In the past I had her taking hydrocodone/APAP 5/325 3 times a day and she seemed to be doing better and how she is doing now that she is on oxycodone IR 5 mg every 6 hours, which is a stronger pain medication.  Although it is counterintuitive, I have tried to explain to the patient that all of these problems may be secondary to to opioid tolerance and hyper algesia and perhaps what we need to do is do a drug holiday.  Of course, the patient refuses to do that.  At this point, the plan is to switch the patient to [facility] on the next refill.  She has been inquiring about the Duragesic patches, but I am not entirely sure that this would be a good option for her.  I am also very concerned about her memory since I had to repeat the information that I gave her today  several times.  I also used a technique where I asked her to repeat the information back to me and she was having difficulty doing so.  Because of this, I think that he would be very helpful if the patient can come to her  visit accompanied by a family member.  Pharmacotherapy Assessment  Opioid Analgesic: Oxycodone IR 5 mg, 1 tab PO q 8 hrs (15 mg/day of oxycodone) MME/day: 22.5 mg/day.    Monitoring: [place] PMP: PDMP reviewed during this encounter.       Pharmacotherapy: No side-effects or adverse reactions reported. Compliance: No problems identified. Effectiveness: Clinically acceptable. Plan: Refer to "POC". UDS:  Summary  Date Value Ref Range Status  07/31/2023 Note  Final    Comment:    ==================================================================== [employer] Select 13 (MW) ==================================================================== Test                             Result       Flag       Units  Drug Present and Declared for Prescription Verification   Oxycodone                      839          EXPECTED   ng/mg creat   Noroxycodone                   3412         EXPECTED   ng/mg creat    Sources of oxycodone include scheduled prescription medications.    Noroxycodone is an expected metabolite of oxycodone.  Drug Present not Declared for Prescription Verification   Hydrocodone                    340          UNEXPECTED ng/mg creat   Dihydrocodeine                 32           UNEXPECTED ng/mg creat   Norhydrocodone                 403          UNEXPECTED ng/mg creat    Sources of hydrocodone include scheduled prescription medications.    Dihydrocodeine and norhydrocodone are expected metabolites of    hydrocodone. Dihydrocodeine is also available as a scheduled    prescription medication.  ==================================================================== Test                      Result    Flag   Units      Ref Range   Creatinine              208              mg/dL      >=82 ==================================================================== Declared Medications:  The flagging and interpretation on this report are based on the  following declared medications.  Unexpected  results may arise from  inaccuracies in the declared medications.   **Note: The testing scope of this panel includes these medications:   Oxycodone (Roxicodone)   **Note: The testing scope of this panel does not include the  following reported medications:   Amlodipine (Norvasc)  Aspirin  Calcium  Chondroitin  Ciprofloxacin (Cipro)  Citalopram (Celexa)  Cyclobenzaprine (Flexeril)  Donepezil (Aricept)  Erythromycin  Gabapentin (Neurontin)  Glucosamine  Hydrocortisone (Anusol-HC)  Letrozole (Femara)  Losartan (Cozaar)  Lubiprostone (Amitiza)  Magnesium (Mag-Ox)  Melatonin  Metoprolol (Toprol)  Mirabegron (Myrbetriq)  Multivitamin  Naloxone (Narcan)  Nitrofurantoin (Macrobid)  Omega-3 Fatty Acids  Pantoprazole (Protonix)  Rosuvastatin (Crestor) ==================================================================== For clinical consultation, please call [phone]. ====================================================================    No results found for: "CBDTHCR", "D8THCCBX", "D9THCCBX"   Laboratory Chemistry Profile   Renal Lab Results  Component Value Date   BUN 31 (H) 03/25/2024   CREATININE 0.70 03/25/2024   BCR 20 06/11/2020   GFR 46.06 (L) 02/18/2024   GFRAA >60 07/27/2020   GFRNONAA >60 03/25/2024    Hepatic Lab Results  Component Value Date   AST 19 02/18/2024   ALT 21 02/18/2024   ALBUMIN 4.0 02/18/2024   ALKPHOS 25 (L) 02/18/2024    Electrolytes Lab Results  Component Value Date   NA 129 (L) 03/25/2024   K 3.1 (L) 03/25/2024   CL 100 03/25/2024   CALCIUM 8.8 (L) 03/25/2024   MG 2.2 07/17/2018    Bone Lab Results  Component Value Date   VD25OH 66.95 09/26/2022   25OHVITD1 42 07/17/2018   25OHVITD2 <1.0 07/17/2018   25OHVITD3 42 07/17/2018    Inflammation (CRP: Acute Phase) (ESR: Chronic Phase) Lab Results  Component Value Date   CRP 3 07/17/2018   ESRSEDRATE 27 07/17/2018         Note: Above Lab results  reviewed.  Imaging  DG Shoulder Left CLINICAL DATA:  Pain after injury.  EXAM: LEFT SHOULDER - 2+ VIEW  COMPARISON:  None Available.  FINDINGS: There is no evidence of fracture or dislocation. Minor acromioclavicular degenerative spurring. No erosions or focal bone abnormality. Soft tissues are unremarkable.  IMPRESSION: 1. No fracture or dislocation of the left shoulder. 2. Minor acromioclavicular degenerative spurring.  Electronically Signed   By: Narda Rutherford M.D.   On: 03/25/2024 21:28  Assessment  The primary encounter diagnosis was Chronic pain syndrome. Diagnoses of Acute pain of left shoulder, Sprain of left shoulder, initial encounter, and Memory impairment of gradual onset were also pertinent to this visit.  Plan of Care  Problem-specific:  No problem-specific Assessment & Plan notes found for this encounter.  Wanda Martinez has a current medication list which includes the following long-term medication(s): amlodipine, calcium carbonate, citalopram, citalopram, donepezil, gabapentin, hydrochlorothiazide, losartan, magnesium oxide, metoprolol succinate, mirabegron er, [START ON 04/12/2024] oxycodone, pantoprazole, and rosuvastatin.  Pharmacotherapy (Medications Ordered): No orders of the defined types were placed in this encounter.  Orders:  No orders of the defined types were placed in this encounter.  Follow-up plan:   No follow-ups on file.      Interventional Therapies  Risk Factors  Considerations  Medical Comorbidities:  Parkinson's disease  Hx right breast cancer  OIC  SOB  CKD Stage 3b  GERD  depression  osteopenia/osteoporosis  PSVT  Memory impairment      Planned  Pending:      Under consideration:  Therapeutic right suprascapular nerve RFA #1  Possible intrathecal pump trial and permanent implant    Completed:   Therapeutic bilateral lumbar facet Blk x15 (03/20/2024)  Therapeutic bilateral lumbar facet RFA x R4L2   Diagnostic/therapeutic right suprascapular NB x2  Therapeutic bilateral trapezius/thoracic TPI x R6L1  Therapeutic right lumbar TPI x1  Therapeutic bilateral SI inj. x R3L2  Therapeutic right SI RFA x4  Therapeutic right knee inj. (steroid x5) (Zilretta x3) (Monovisc x1) (pes anserine x2) (12/06/2023)  Therapeutic TFESI (bilateral: L5, L2 x1) (right: L5 x4,  L4 x1, L3 x1)  Therapeutic LESI (midline: L2-3 x1) (right: L3-4 x2, L4-5 x2, L5-S1 x2)    Therapeutic  Palliative (PRN) options:   Do not schedule any procedures without a proper preprocedure evaluation.   Completed by other providers:   Therapeutic right knee (ORTHOVISC) inj. x3 (05/18/2023, 05/23/2023, 05/30/2023) by Aram Candela, MD [facility]) Therapeutic left knee (Kenalog) inj. x1 (08/25/2020) by Novella Olive, MD [facility])   Pharmacotherapy  Nonopioids transfer 12/21/2020: Gabapentin      Recent Visits Date Type Provider Dept  03/26/24 Office Visit [name], MD [facility]  03/20/24 Procedure visit [name], MD [facility]  02/11/24 Office Visit [name], MD [facility]  02/05/24 Procedure visit [name], MD [facility]  01/29/24 Office Visit [name], MD [facility]  01/15/24 Procedure visit [name], MD [facility]  Showing recent visits within past 90 days and meeting all other requirements Future Appointments Date Type Provider Dept  04/08/24 Appointment [name], MD [facility]  05/07/24 Appointment [name], MD [facility]  Showing future appointments within next 90 days and meeting all other requirements  I discussed the assessment and treatment plan with the patient. The patient was provided an opportunity to ask questions and all were answered. The patient agreed with the plan and demonstrated an understanding of the instructions.  Patient  advised to call back or seek an in-person evaluation if the symptoms or condition worsens.  Duration of encounter: 25 minutes.  Note by: Oswaldo Done, MD Date: 03/26/2024; Time: 6:13 AM

## 2024-03-27 ENCOUNTER — Telehealth: Payer: Self-pay

## 2024-03-27 DIAGNOSIS — R413 Other amnesia: Secondary | ICD-10-CM | POA: Insufficient documentation

## 2024-03-27 DIAGNOSIS — S43402A Unspecified sprain of left shoulder joint, initial encounter: Secondary | ICD-10-CM | POA: Insufficient documentation

## 2024-03-27 DIAGNOSIS — Z853 Personal history of malignant neoplasm of breast: Secondary | ICD-10-CM

## 2024-03-27 DIAGNOSIS — N1832 Chronic kidney disease, stage 3b: Secondary | ICD-10-CM

## 2024-03-27 DIAGNOSIS — M545 Low back pain, unspecified: Secondary | ICD-10-CM

## 2024-03-27 DIAGNOSIS — M25512 Pain in left shoulder: Secondary | ICD-10-CM | POA: Insufficient documentation

## 2024-03-27 DIAGNOSIS — N1831 Chronic kidney disease, stage 3a: Secondary | ICD-10-CM

## 2024-03-27 NOTE — Telephone Encounter (Signed)
 Copied from CRM [id number]. Topic: General - Other >> Mar 27, 2024  9:27 AM Ardeth Perfect H wrote: Reason for CRM: Spoke with the patient's son, Sibley Rolison [phone]), regarding the patient's approval from Dr. Lorin Picket on 03/07/2024 for an Ambulatory Referral to Palliative Care. He is requesting to be contacted to schedule the appointment.

## 2024-03-28 ENCOUNTER — Other Ambulatory Visit: Payer: Self-pay

## 2024-03-28 ENCOUNTER — Other Ambulatory Visit: Payer: Medicare Other

## 2024-03-28 NOTE — Telephone Encounter (Signed)
 Ok

## 2024-03-28 NOTE — Telephone Encounter (Signed)
 Referral was closed due to unable to reach family. Ok to place new referral for them?

## 2024-03-31 ENCOUNTER — Telehealth: Payer: Self-pay | Admitting: Pain Medicine

## 2024-03-31 NOTE — Telephone Encounter (Signed)
 Patient is having a lot of pain in her left shoulder and would like to get some kind of injection or norflex/toradol to help with the pain. Please advise patient

## 2024-03-31 NOTE — Addendum Note (Signed)
 Addended by: Victorino Grates D on: 03/31/2024 10:38 AM   Modules accepted: Orders

## 2024-03-31 NOTE — Telephone Encounter (Signed)
 Referral re-entered with note to contact son to schedule initial visit.

## 2024-04-01 ENCOUNTER — Encounter: Payer: Self-pay | Admitting: Pain Medicine

## 2024-04-01 ENCOUNTER — Other Ambulatory Visit: Payer: Self-pay

## 2024-04-01 ENCOUNTER — Ambulatory Visit: Attending: Pain Medicine | Admitting: Pain Medicine

## 2024-04-01 VITALS — BP 106/71 | HR 84 | Temp 96.8°F | Ht 63.0 in | Wt 156.0 lb

## 2024-04-01 DIAGNOSIS — M25512 Pain in left shoulder: Secondary | ICD-10-CM | POA: Diagnosis not present

## 2024-04-01 DIAGNOSIS — M19012 Primary osteoarthritis, left shoulder: Secondary | ICD-10-CM | POA: Diagnosis not present

## 2024-04-01 DIAGNOSIS — G8929 Other chronic pain: Secondary | ICD-10-CM | POA: Diagnosis not present

## 2024-04-01 MED ORDER — TRIAMCINOLONE ACETONIDE 40 MG/ML IJ SUSP
40.0000 mg | Freq: Once | INTRAMUSCULAR | Status: AC
  Administered 2024-04-01: 40 mg

## 2024-04-01 MED ORDER — ROPIVACAINE HCL 2 MG/ML IJ SOLN
4.0000 mL | Freq: Once | INTRAMUSCULAR | Status: AC
  Administered 2024-04-01: 4 mL

## 2024-04-01 MED ORDER — ROPIVACAINE HCL 2 MG/ML IJ SOLN
INTRAMUSCULAR | Status: AC
  Filled 2024-04-01: qty 20

## 2024-04-01 MED ORDER — TRIAMCINOLONE ACETONIDE 40 MG/ML IJ SUSP
INTRAMUSCULAR | Status: AC
  Filled 2024-04-01: qty 1

## 2024-04-01 NOTE — Patient Instructions (Signed)

## 2024-04-01 NOTE — Progress Notes (Signed)
 Safety precautions to be maintained throughout the outpatient stay will include: orient to surroundings, keep bed in low position, maintain call bell within reach at all times, provide assistance with transfer out of bed and ambulation.

## 2024-04-01 NOTE — Progress Notes (Signed)
 PROVIDER NOTE: Interpretation of information contained herein should be left to medically-trained personnel. Specific patient instructions are provided elsewhere under "Patient Instructions" section of medical record. This document was created in part using AI and STT-dictation technology, any transcriptional errors that may result from this process are unintentional.  Patient: Wanda Martinez  Service: E/M   PCP: Dellar Fenton, MD  DOB: [birth year]  DOS: 04/01/2024  Provider: Candi Chafe, MD  MRN: [id number]  Delivery: Face-to-face  Specialty: Interventional Pain Management  Type: Established Patient  Setting: Ambulatory outpatient facility  Specialty designation: 09  Referring Prov.: Dellar Fenton, MD  Location: Outpatient office facility       HPI  Ms. Tazaria Kennita Pavlovich, a 89 y.o. year old female, is here today because of her Acute pain of left shoulder [M25.512]. Ms. Montag primary complain today is Shoulder Pain (left)  Pertinent problems: Ms. Onstad has Headache; Chronic low back pain (1ry area of Pain) (Bilateral) (R>L) w/o sciatica; Lumbar spondylosis; Chronic hip pain (Bilateral); Chronic neck pain; Cervical spondylosis; Chronic cervical radicular pain (Right); Diffuse myofascial pain syndrome; Neurogenic pain; Chronic upper back pain (Right); Myofascial pain syndrome (Right) (cervicothoracic); Lumbar facet syndrome (Bilateral) (R>L); History of breast cancer; Cervical facet hypertrophy; Cervical facet syndrome (HCC); Chronic shoulder pain (Right); Chronic pain syndrome; Chronic sacroiliac joint pain (Left); Chronic sacroiliac joint pain (Right); 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, L4-5, L5-S1) (Right); Lumbar spinal stenosis (with neurogenic claudication) (L3-4); Chronic lower extremity pain (Right); Chronic lumbar radicular pain (Right); Trochanteric bursitis of hip (Bilateral); Spondylosis without myelopathy or radiculopathy, lumbosacral region; Trigger point with back pain  (Right); DDD (degenerative disc disease), lumbosacral; Chronic upper extremity pain (Right); Chronic thoracic back pain (Bilateral) (L>R); Trigger point of thoracic region (Bilateral) (L>R); Other specified dorsopathies, sacral and sacrococcygeal region; Sacroiliac joint dysfunction (Right); Bilateral sacroiliitis (HCC); Somatic dysfunction of sacroiliac joint (Right); Chronic musculoskeletal pain; Facial pain; Chronic hip pain (Right); Osteoarthritis of hip (Right); Left ear pain; 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HCC); Abnormal MRI, lumbar spine (03/14/2024); Osteoarthritis involving multiple joints; Other spondylosis, sacral and sacrococcygeal region; Lumbar facet hypertrophy (Multilevel) (Bilateral); Chronic knee pain (Right); Osteoarthritis of knee (Right); Trigger point of shoulder region (Right); Osteoarthritis of AC (acromioclavicular) joint (Right); Osteoarthritis of shoulder (Right); Unspecified injury of muscle(s) and tendon(s) of the rotator cuff of shoulder, sequela (Right); Cervicalgia; DDD (degenerative disc disease), lumbar; Lumbar radicular pain (Bilateral); Lumbar foraminal stenosis (Right: L3-4, L4-5, L5-S1) (Left: L2-3); Abnormal MRI, hip (Right) (05/04/2022); Abnormal MRI, knee (Right) (11/29/2023); Tricompartment osteoarthritis of knee (Right); Chronic low back pain (Right) w/o sciatica; Hoffa's fat pad disease (HCC) (Right); Hoffa's pad syndrome (HCC) (Right); Chronic low back pain (Right) w/ sciatica (Right); Lower extremity weakness (Right); Chronic radicular pain of lower back; Chronic sciatica (Right); Lumbosacral radiculopathy at L5 (Right); Low back pain of over 3 months duration; Personal history of breast cancer; Pes anserine bursitis (Right); Infrapatellar bursitis of knee (Right); Traumatic ecchymosis of right lower leg; Lumbar facet joint pain; Medial knee pain (Right); Chronic patellofemoral pain of knee (Right); Patellofemoral joint pain (Right); Knee hemarthrosis (Right); Pes  anserinus bursitis of right knee; Abnormal MRI, shoulder (Right) (06/21/2023); Wound, open, leg; Chronic hip pain (Left); Osteoarthritis of hips (Bilateral) (R>L); Intractable back pain, acute on chronic; Low back pain; Trochanteric bursitis of left hip; Acute pain of left shoulder; Sprain of left shoulder, initial encounter; Chronic shoulder pain (Left); Osteoarthritis of AC (acromioclavicular) joint (Left); Arthralgia of acromioclavicular joint (Left); Osteoarthritis of shoulder (Left); and Trigger point of shoulder region (Left) on  their pertinent problem list. Pain Assessment: Severity of Chronic pain is reported as a 9 /10. Location: Shoulder Left/denies. Onset: More than a month ago. Quality: Aching, Constant, Discomfort. Timing:  . Modifying factor(s): meds. Vitals:  height is 5\' 3"  (1.6 m) and weight is 156 lb (70.8 kg). Her temperature is 96.8 F (36 C) (abnormal). Her blood pressure is 106/71 and her pulse is 84. Her oxygen saturation is 96%.  BMI: Estimated body mass index is 27.63 kg/m as calculated from the following:   Height as of this encounter: 5\' 3"  (1.6 m).   Weight as of this encounter: 156 lb (70.8 kg). Last encounter: 03/26/2024. Last procedure: 03/20/2024.  Reason for encounter: evaluation of new problem.  The patient comes in today complaining of left shoulder pain.  Recent x-ray of the left shoulder shows osteoarthritis of the acromioclavicular joint.  Discussed the use of AI scribe software for clinical note transcription with the patient, who gave verbal consent to proceed.  History of Present Illness   Wanda Martinez is an 89 year old female who presents with left shoulder pain. She was referred by Dr. Vicenta Graft for evaluation of left shoulder pain.  She experiences pain in the left shoulder, primarily towards the back, which intensifies when she raises her arm above her head or attempts to pull herself upward. The pain is described as more muscular rather than  joint-related.  An x-ray of the left shoulder showed no evidence of fracture or dislocation but did reveal arthritis in the acromioclavicular joint, which typically causes pain towards the front of the shoulder. However, her current pain is more posterior and muscular in nature.  She has noticed multiple bruises on her body but is unsure if she is taking any blood thinners.       Pharmacotherapy Assessment  Analgesic: Oxycodone IR 5 mg, 1 tab PO q 8 hrs (15 mg/day of oxycodone) MME/day: 22.5 mg/day.     Monitoring: [facility] PMP: PDMP not reviewed this encounter.       Pharmacotherapy: No side-effects or adverse reactions reported. Compliance: No problems identified. Effectiveness: Clinically acceptable.  Merilyn Staple, RN  04/01/2024  1:14 PM  Sign when Signing Visit Safety precautions to be maintained throughout the outpatient stay will include: orient to surroundings, keep bed in low position, maintain call bell within reach at all times, provide assistance with transfer out of bed and ambulation.     No results found for: "CBDTHCR" No results found for: "D8THCCBX" No results found for: "D9THCCBX"  UDS:  Summary  Date Value Ref Range Status  07/31/2023 Note  Final    Comment:    ==================================================================== [employer] [facility] [employer] (MW) ==================================================================== Test                             Result       Flag       Units  Drug Present and Declared for Prescription Verification   Oxycodone                      839          EXPECTED   ng/mg creat   Noroxycodone                   3412         EXPECTED   ng/mg creat    Sources of oxycodone include scheduled prescription medications.    Noroxycodone  is an expected metabolite of oxycodone.  Drug Present not Declared for Prescription Verification   Hydrocodone                    340          UNEXPECTED ng/mg creat   Dihydrocodeine                 32            UNEXPECTED ng/mg creat   Norhydrocodone                 403          UNEXPECTED ng/mg creat    Sources of hydrocodone include scheduled prescription medications.    Dihydrocodeine and norhydrocodone are expected metabolites of    hydrocodone. Dihydrocodeine is also available as a scheduled    prescription medication.  ==================================================================== Test                      Result    Flag   Units      Ref Range   Creatinine              208              mg/dL      >=16 ==================================================================== Declared Medications:  The flagging and interpretation on this report are based on the  following declared medications.  Unexpected results may arise from  inaccuracies in the declared medications.   **Note: The testing scope of this panel includes these medications:   Oxycodone (Roxicodone)   **Note: The testing scope of this panel does not include the  following reported medications:   Amlodipine (Norvasc)  Aspirin  Calcium  Chondroitin  Ciprofloxacin (Cipro)  Citalopram (Celexa)  Cyclobenzaprine (Flexeril)  Donepezil (Aricept)  Erythromycin  Gabapentin (Neurontin)  Glucosamine  Hydrocortisone (Anusol-HC)  Letrozole (Femara)  Losartan (Cozaar)  Lubiprostone (Amitiza)  Magnesium (Mag-Ox)  Melatonin  Metoprolol (Toprol)  Mirabegron (Myrbetriq)  Multivitamin  Naloxone (Narcan)  Nitrofurantoin (Macrobid)  Omega-3 Fatty Acids  Pantoprazole (Protonix)  Rosuvastatin (Crestor) ==================================================================== For clinical consultation, please call [phone]. ====================================================================       ROS  Constitutional: Denies any fever or chills Gastrointestinal: No reported hemesis, hematochezia, vomiting, or acute GI distress Musculoskeletal: Denies any acute onset joint swelling, redness, loss of ROM, or  weakness Neurological: No reported episodes of acute onset apraxia, aphasia, dysarthria, agnosia, amnesia, paralysis, loss of coordination, or loss of consciousness  Medication Review  Glucosamine-Chondroitin, Omega-3, acetaminophen, amLODipine, aspirin EC, calcium carbonate, citalopram, donepezil, gabapentin, hydrochlorothiazide, losartan, magnesium oxide, melatonin, meloxicam, methocarbamol, metoprolol succinate, mirabegron ER, multivitamin, naloxone, oxyCODONE, pantoprazole, polyethylene glycol, predniSONE, and rosuvastatin  History Review  Allergy: Ms. Ryall is allergic to sulfa antibiotics and vesicare [solifenacin]. Drug: Ms. Warmuth  reports current drug use. Alcohol:  reports no history of alcohol use. Tobacco:  reports that she has never smoked. She has never been exposed to tobacco smoke. She has never used smokeless tobacco. Social: Ms. Amy  reports that she has never smoked. She has never been exposed to tobacco smoke. She has never used smokeless tobacco. She reports current drug use. She reports that she does not drink alcohol. Medical:  has a past medical history of Acute postoperative pain (10/25/2017), Anemia, Arm pain (07/26/2015), Arthritis, Arthritis, degenerative (03/26/2014), Back pain (11/01/2013), Bladder infection (06/2018), Breast cancer (HCC), Breast cancer (HCC), CHEST PAIN (04/29/2010), Chronic cystitis, Cystocele (02/22/2013), Cystocele, midline (08/19/2013), Degeneration of intervertebral disc of  lumbosacral region (03/26/2014), DYSPNEA (04/29/2010), Enthesopathy of hip (03/26/2014), GERD (gastroesophageal reflux disease), Hiatal hernia, HTN (hypertension), Hypokalemia (06/2018), Hyponatremia (06/2018), LBP (low back pain) (03/26/2014), Neck pain (11/01/2013), Parkinson disease (HCC), Pneumonia (06/2018), Sinusitis (02/07/2015), Skin lesions (07/12/2014), and Urinary incontinence. Surgical: Ms. Shroff  has a past surgical history that includes Tonsillectomy and adenoidectomy (1979);  Vesicovaginal fistula closure w/ TAH (1983); Breast surgery (1986); Breast enhancement surgery (1987); Breast implant removal; Breast implant removal (Right, 08/29/2012); Mastectomy (08/2012); Abdominal hysterectomy; Colonoscopy with propofol (N/A, 09/13/2016); Breast biopsy (2013); and Blepharoplasty. Family: family history includes Colon polyps in her father; Diabetes in her father; Parkinson's disease in her mother; Stroke in her father and mother.  Laboratory Chemistry Profile   Renal Lab Results  Component Value Date   BUN 31 (H) 03/25/2024   CREATININE 0.70 03/25/2024   BCR 20 06/11/2020   GFR 46.06 (L) 02/18/2024   GFRAA >60 07/27/2020   GFRNONAA >60 03/25/2024    Hepatic Lab Results  Component Value Date   AST 19 02/18/2024   ALT 21 02/18/2024   ALBUMIN 4.0 02/18/2024   ALKPHOS 25 (L) 02/18/2024    Electrolytes Lab Results  Component Value Date   NA 129 (L) 03/25/2024   K 3.1 (L) 03/25/2024   CL 100 03/25/2024   CALCIUM 8.8 (L) 03/25/2024   MG 2.2 07/17/2018    Bone Lab Results  Component Value Date   VD25OH 66.95 09/26/2022   25OHVITD1 42 07/17/2018   25OHVITD2 <1.0 07/17/2018   25OHVITD3 42 07/17/2018    Inflammation (CRP: Acute Phase) (ESR: Chronic Phase) Lab Results  Component Value Date   CRP 3 07/17/2018   ESRSEDRATE 27 07/17/2018         Note: Above Lab results reviewed.  Recent Imaging Review  DG Shoulder Left CLINICAL DATA:  Pain after injury.  EXAM: LEFT SHOULDER - 2+ VIEW  COMPARISON:  None Available.  FINDINGS: There is no evidence of fracture or dislocation. Minor acromioclavicular degenerative spurring. No erosions or focal bone abnormality. Soft tissues are unremarkable.  IMPRESSION: 1. No fracture or dislocation of the left shoulder. 2. Minor acromioclavicular degenerative spurring.  Electronically Signed   By: Chadwick Colonel M.D.   On: 03/25/2024 21:28 Note: Reviewed        Physical Exam  General appearance: Well  nourished, well developed, and well hydrated. In no apparent acute distress Mental status: Alert, oriented x 3 (person, place, & time)       Respiratory: No evidence of acute respiratory distress Eyes: PERLA Vitals: BP 106/71   Pulse 84   Temp (!) 96.8 F (36 C)   Ht 5\' 3"  (1.6 m)   Wt 156 lb (70.8 kg)   LMP 12/18/1981   SpO2 96%   BMI 27.63 kg/m  BMI: Estimated body mass index is 27.63 kg/m as calculated from the following:   Height as of this encounter: 5\' 3"  (1.6 m).   Weight as of this encounter: 156 lb (70.8 kg). Ideal: Ideal body weight: 52.4 kg (115 lb 8.3 oz) Adjusted ideal body weight: 59.7 kg (131 lb 11.4 oz)  Physical Exam   MUSCULOSKELETAL: Tenderness in the left shoulder posteriorly. SKIN: Multiple bruises present on the skin.       Assessment   Diagnosis Status  1. Acute pain of left shoulder   2. Chronic shoulder pain (Left)   3. Osteoarthritis of left AC (acromioclavicular) joint   4. Arthralgia of left acromioclavicular joint   5. Primary osteoarthritis of left shoulder  6. Trigger point of shoulder region (Left)    Having a Flare-up Recurring Deteriorating   Updated Problems: Problem  Chronic shoulder pain (Left)  Osteoarthritis of AC (acromioclavicular) joint (Left)  Arthralgia of acromioclavicular joint (Left)  Osteoarthritis of shoulder (Left)  Trigger point of shoulder region (Left)    Plan of Care  Problem-specific:  Assessment and Plan    Left shoulder pain   Pain in the left shoulder is likely due to muscle strain. X-ray showed no fracture or dislocation but revealed acromioclavicular joint arthritis, which typically causes anterior shoulder pain. Muscle strain is suspected, possibly from pulling herself up. Perform trigger point injection in the left shoulder muscle.  Bruising   She has multiple bruises on her body with unclear etiology. It is uncertain if she is on anticoagulants. Further investigation may be needed.      Ms. Aolanis Crispen has a current medication list which includes the following long-term medication(s): amlodipine, calcium carbonate, citalopram, citalopram, donepezil, gabapentin, hydrochlorothiazide, losartan, magnesium oxide, metoprolol succinate, mirabegron er, [START ON 04/12/2024] oxycodone, pantoprazole, and rosuvastatin.  Pharmacotherapy (Medications Ordered): Meds ordered this encounter  Medications   ropivacaine (PF) 2 mg/mL (0.2%) (NAROPIN) injection 4 mL   triamcinolone acetonide (KENALOG-40) injection 40 mg   Orders:  Orders Placed This Encounter  Procedures   TRIGGER POINT INJECTION    Scheduling Instructions:     Area: Upper Back     Side: Left     Sedation: No Sedation.     Timeframe: Today    Where will this procedure be performed?:   [facility]   Informed Consent Details: Physician/Practitioner Attestation; Transcribe to consent form and obtain patient signature    Provider Attestation: I, Lashica Hannay A. Barth Borne, MD, (Pain Management Specialist), the physician/practitioner, attest that I have discussed with the patient the benefits, risks, side effects, alternatives, likelihood of achieving goals and potential problems during recovery for the procedure that I have provided informed consent.    Scheduling Instructions:     Note: Always confirm laterality of pain with Ms. Enrigue Harvard, before procedure.     Transcribe to consent form and obtain patient signature.    Physician/Practitioner attestation of informed consent for procedure/surgical case:   I, the physician/practitioner, attest that I have discussed with the patient the benefits, risks, side effects, alternatives, likelihood of achieving goals and potential problems during recovery for the procedure that I have provided informed consent.    Procedure:   Myoneural Block (Trigger Point injection)    Physician/Practitioner performing the procedure:   Geran Haithcock A. Barth Borne MD    Indication/Reason:   Musculoskeletal  pain/myofascial pain secondary to trigger point   Provide equipment / supplies at bedside    Procedure tray: "Block Tray" (Disposable  single use) Skin infiltration needle: Regular 1.5-in, 25-G, (x1) Block Needle type: Regular Amount/quantity: 1 Size: Short(1.5-inch) Gauge: 25G    Standing Status:   Standing    Number of Occurrences:   1    Specify:   Block Tray   Follow-up plan:   Return in about 2 weeks (around 04/15/2024) for (Face2F), (PPE).     Interventional Therapies  Risk Factors  Considerations  Medical Comorbidities:  Parkinson's disease  Hx right breast cancer  OIC  SOB  CKD Stage 3b  GERD  depression  osteopenia/osteoporosis  PSVT  Memory impairment      Planned  Pending:      Under consideration:  Therapeutic right suprascapular nerve RFA #1  Possible intrathecal pump trial  and permanent implant    Completed:   Therapeutic bilateral lumbar facet Blk x15 (03/20/2024)  Therapeutic bilateral lumbar facet RFA x R4L2  Diagnostic/therapeutic right suprascapular NB x2  Therapeutic bilateral trapezius/thoracic TPI x R6L1  Therapeutic right lumbar TPI x1  Therapeutic bilateral SI inj. x R3L2  Therapeutic right SI RFA x4  Therapeutic right knee inj. (steroid x5) (Zilretta x3) (Monovisc x1) (pes anserine x2) (12/06/2023)  Therapeutic TFESI (bilateral: L5, L2 x1) (right: L5 x4, L4 x1, L3 x1)  Therapeutic LESI (midline: L2-3 x1) (right: L3-4 x2, L4-5 x2, L5-S1 x2)    Therapeutic  Palliative (PRN) options:   Do not schedule any procedures without a proper preprocedure evaluation.   Completed by other providers:   Therapeutic right knee (ORTHOVISC) inj. x3 (05/18/2023, 05/23/2023, 05/30/2023) by Aram Candela, MD [facility]) Therapeutic left knee (Kenalog) inj. x1 (08/25/2020) by Novella Olive, MD [facility])   Pharmacotherapy  Nonopioids transfer 12/21/2020: Gabapentin     Recent Visits Date Type Provider Dept  03/26/24 Office Visit [name], MD [facility]  03/20/24 Procedure visit [name], MD [facility]  02/11/24 Office Visit [name], MD [facility]  02/05/24 Procedure visit [name], MD [facility]  01/29/24 Office Visit [name], MD [facility]  01/15/24 Procedure visit [name], MD [facility]  Showing recent visits within past 90 days and meeting all other requirements Today's Visits Date Type Provider Dept  04/01/24 Office Visit [name], MD [facility]  Showing today's visits and meeting all other requirements Future Appointments Date Type Provider Dept  04/22/24 Appointment [name], MD [facility]  05/07/24 Appointment [name], MD [facility]  Showing future appointments within next 90 days and meeting all other requirements  I discussed the assessment and treatment plan with the patient. The patient was provided an opportunity to ask questions and all were answered. The patient agreed with the plan and demonstrated an understanding of the instructions.  Patient advised to call back or seek an in-person evaluation if the symptoms or condition worsens.  Duration of encounter: 30 minutes.  Total time on encounter, as per AMA guidelines included both the face-to-face and non-face-to-face time personally spent by the physician and/or other qualified health care professional(s) on the day of the encounter (includes time in activities that require the physician or other qualified health care professional and does not include time in activities normally performed by clinical staff). Physician's time may include the following activities when performed: Preparing to see the patient (e.g., pre-charting review of records, searching for previously ordered imaging, lab work, and nerve conduction tests) Review of prior analgesic  pharmacotherapies. Reviewing PMP Interpreting ordered tests (e.g., lab work, imaging, nerve conduction tests) Performing post-procedure evaluations, including interpretation of diagnostic procedures Obtaining and/or reviewing separately obtained history Performing a medically appropriate examination and/or evaluation Counseling and educating the patient/family/caregiver Ordering medications, tests, or procedures Referring and communicating with other health care professionals (when not separately reported) Documenting clinical information in the electronic or other health record Independently interpreting results (not separately reported) and communicating results to the patient/ family/caregiver Care coordination (not separately reported)  Note by: Oswaldo Done, MD (TTS and AI technology used. I apologize for any typographical errors that were not detected and corrected.) Date: 04/01/2024; Time: 3:55 PM

## 2024-04-01 NOTE — Progress Notes (Signed)
 PROVIDER NOTE: Interpretation of information contained herein should be left to medically-trained personnel. Specific patient instructions are provided elsewhere under "Patient Instructions" section of medical record. This document was created in part using STT-dictation technology, any transcriptional errors that may result from this process are unintentional.  Patient: Wanda Martinez Type: Established DOB: Jul 08, 1935 MRN: [id number] PCP: Dellar Fenton, MD  Service: Procedure DOS: 04/01/2024 Setting: Ambulatory Location: Ambulatory outpatient facility Delivery: Face-to-face Provider: Candi Chafe, MD Specialty: Interventional Pain Management Specialty designation: 09 Location: Outpatient facility Ref. Prov.: Dellar Fenton, MD       Interventional Therapy   Type: Infraspinatus Trigger Point Injection (Myoneural Block) (1-2 muscle groups)  #1 (w/ steroids)  CPT: 20552 Laterality: Left (-LT)   Imaging: N/A. Landmark-guided",           Anesthesia: Local anesthesia (1-2% Lidocaine) Anxiolysis: None                 Sedation:                         DOS: 04/01/2024  Performed by: Candi Chafe, MD  Medical Necessity (reasoning)  Purpose: Diagnostic/Therapeutic Rationale (medical necessity): procedure needed and proper for the diagnosis and/or treatment of [name]'s medical symptoms and needs. Indications: Left posterior shoulder pain severe enough to impact quality of life and/or function. 1. Acute pain of left shoulder   2. Chronic shoulder pain (Left)   3. Osteoarthritis of left AC (acromioclavicular) joint   4. Arthralgia of left acromioclavicular joint   5. Primary osteoarthritis of left shoulder   6. Trigger point of shoulder region (Left)    NAS-11 Pain score:   Pre-procedure: 9 /10   Post-procedure: 9 /10       Muscle: Infraspinatus Target: Myoneural mass of trigger point. Region: Shoulder Anatomic Surface: Posterior Anatomic Area:  Left  upper extremity Level:  Close with insertion at the humeral head  Approach: Percutaneous  Type of procedure: Myoneural injection   Position  Prep  Materials  Position: Sitting. Patient assisted into a comfortable position. Pressure points checked.  Prep solution: ChloraPrep (2% chlorhexidine gluconate and 70% isopropyl alcohol) The target area was identified and the area prepped in the usual manner.  Prep Area: Left posterior  shoulder  region  Materials:   Tray: Block Needle(s):  Type: Regular  Gauge (G): 25  Length: 1.5-in  Qty: 1  H&P (Pre-op Assessment):  [name] is a 89 y.o. (year old), female patient, seen today for interventional treatment. She  has a past surgical history that includes Tonsillectomy and adenoidectomy (1979); Vesicovaginal fistula closure w/ TAH (1983); Breast surgery (1986); Breast enhancement surgery (1987); Breast implant removal; Breast implant removal (Right, 08/29/2012); Mastectomy (08/2012); Abdominal hysterectomy; Colonoscopy with propofol (N/A, 09/13/2016); Breast biopsy (2013); and Blepharoplasty. [name] has a current medication list which includes the following prescription(s): acetaminophen, amlodipine, aspirin ec, calcium carbonate, citalopram, citalopram, donepezil, gabapentin, glucosamine-chondroitin, hydrochlorothiazide, losartan, magnesium oxide, melatonin, meloxicam, methocarbamol, metoprolol succinate, mirabegron er, multivitamin, naloxone, omega-3, [START ON 04/12/2024] oxycodone, pantoprazole, polyethylene glycol, prednisone, and rosuvastatin. Her primarily concern today is the Shoulder Pain (left)  Initial Vital Signs:  Pulse/HCG Rate: 84  Temp: (!) 96.8 F (36 C) Resp:   BP: 106/71 SpO2: 96 %  BMI: Estimated body mass index is 27.63 kg/m as calculated from the following:   Height as of this encounter: 5\' 3"  (1.6 m).   Weight as of this encounter: 156 lb (70.8 kg).  Risk  Assessment: Allergies: Reviewed. She is allergic to sulfa  antibiotics and vesicare [solifenacin].  Allergy Precautions: None required Coagulopathies: Reviewed. None identified.  Blood-thinner therapy: None at this time Active Infection(s): Reviewed. None identified. [name] is afebrile  Site Confirmation: [name] was asked to confirm the procedure and laterality before marking the site Procedure checklist: Completed Consent: Before the procedure and under the influence of no sedative(s), amnesic(s), or anxiolytics, the patient was informed of the treatment options, risks and possible complications. To fulfill our ethical and legal obligations, as recommended by the American Medical Association's Code of Ethics, I have informed the patient of my clinical impression; the nature and purpose of the treatment or procedure; the risks, benefits, and possible complications of the intervention; the alternatives, including doing nothing; the risk(s) and benefit(s) of the alternative treatment(s) or procedure(s); and the risk(s) and benefit(s) of doing nothing. The patient was provided information about the general risks and possible complications associated with the procedure. These may include, but are not limited to: failure to achieve desired goals, infection, bleeding, organ or nerve damage, allergic reactions, paralysis, and death. In addition, the patient was informed of those risks and complications associated to the procedure, such as failure to decrease pain; infection; bleeding; organ or nerve damage with subsequent damage to sensory, motor, and/or autonomic systems, resulting in permanent pain, numbness, and/or weakness of one or several areas of the body; allergic reactions; (i.e.: anaphylactic reaction); and/or death. Furthermore, the patient was informed of those risks and complications associated with the medications. These include, but are not limited to: allergic reactions (i.e.: anaphylactic or anaphylactoid reaction(s)); adrenal axis suppression;  blood sugar elevation that in diabetics may result in ketoacidosis or comma; water retention that in patients with history of congestive heart failure may result in shortness of breath, pulmonary edema, and decompensation with resultant heart failure; weight gain; swelling or edema; medication-induced neural toxicity; particulate matter embolism and blood vessel occlusion with resultant organ, and/or nervous system infarction; and/or aseptic necrosis of one or more joints. Finally, the patient was informed that Medicine is not an exact science; therefore, there is also the possibility of unforeseen or unpredictable risks and/or possible complications that may result in a catastrophic outcome. The patient indicated having understood very clearly. We have given the patient no guarantees and we have made no promises. Enough time was given to the patient to ask questions, all of which were answered to the patient's satisfaction. Ms. Fraiser has indicated that she wanted to continue with the procedure. Attestation: I, the ordering provider, attest that I have discussed with the patient the benefits, risks, side-effects, alternatives, likelihood of achieving goals, and potential problems during recovery for the procedure that I have provided informed consent. Date  Time: 04/01/2024  1:14 PM   Pre-Procedure Preparation:  Monitoring: As per clinic protocol. Respiration, ETCO2, SpO2, BP, heart rate and rhythm monitor placed and checked for adequate function Safety Precautions: Patient was assessed for positional comfort and pressure points before starting the procedure. Time-out: I initiated and conducted the "Time-out" before starting the procedure, as per protocol. The patient was asked to participate by confirming the accuracy of the "Time Out" information. Verification of the correct person, site, and procedure were performed and confirmed by me, the nursing staff, and the patient. "Time-out" conducted as per  Joint Commission's Universal Protocol (UP.01.01.01). Time: 1458 Start Time: 1458 hrs.   Narrative                Start Time:  1458 hrs.  Standard Safety Precautions: Protocol guidelines were followed. Aspiration was conducted prior to injection. At no point did I inject any substances, as a needle was being advanced. No attempts were made at seeking a paresthesia. Safe injection practices and needle disposal techniques used. Medications properly checked for expiration dates. SDV (single dose vial) medications used.  Local Anesthesia: Skin & deeper tissues infiltrated with local anesthetic. Appropriate amount of time allowed for local anesthetics to take effect.   Technical description:  The target area was identified and the area prepped in the usual manner. The procedure needles were then advanced to the target area. Proper needle placement secured. Negative aspiration confirmed. Solution injected in intermittent fashion, asking for systemic symptoms every 0.5cc of injectate. The needles were then removed and the area cleansed, making sure to leave some of the prepping solution back to take advantage of its long term bactericidal properties.  Vitals:   04/01/24 1312  BP: 106/71  Pulse: 84  Temp: (!) 96.8 F (36 C)  SpO2: 96%  Weight: 156 lb (70.8 kg)  Height: 5\' 3"  (1.6 m)     End Time: 1500 hrs.  Imaging Guidance                Type of Imaging Technique: None used Indication(s): N/A Exposure Time: No patient exposure Contrast: None used. Fluoroscopic Guidance: N/A Ultrasound Guidance: N/A Interpretation: N/A   Post-operative Assessment:  Post-procedure Vital Signs:  Pulse/HCG Rate: 84  Temp: (!) 96.8 F (36 C) Resp:   BP: 106/71 SpO2: 96 %  EBL: None  Complications: No immediate post-treatment complications observed by team, or reported by patient.  Note: The patient tolerated the entire procedure well. A repeat set of vitals were taken after the procedure and the  patient was kept under observation following institutional policy, for this type of procedure. Post-procedural neurological assessment was performed, showing return to baseline, prior to discharge. The patient was provided with post-procedure discharge instructions, including a section on how to identify potential problems. Should any problems arise concerning this procedure, the patient was given instructions to immediately contact us, at any time, without hesitation. In any case, we plan to contact the patient by telephone for a follow-up status report regarding this interventional procedure.  Comments:  No additional relevant information.   Plan of Care (POC)  Orders:  Orders Placed This Encounter  Procedures   TRIGGER POINT INJECTION    Scheduling Instructions:     Area: Upper Back     Side: Left     Sedation: No Sedation.     Timeframe: Today    Where will this procedure be performed?:   [facility]   Informed Consent Details: Physician/Practitioner Attestation; Transcribe to consent form and obtain patient signature    Provider Attestation: I, Terrance Usery A. Laban Emperor, MD, (Pain Management Specialist), the physician/practitioner, attest that I have discussed with the patient the benefits, risks, side effects, alternatives, likelihood of achieving goals and potential problems during recovery for the procedure that I have provided informed consent.    Scheduling Instructions:     Note: Always confirm laterality of pain with [name], before procedure.     Transcribe to consent form and obtain patient signature.    Physician/Practitioner attestation of informed consent for procedure/surgical case:   I, the physician/practitioner, attest that I have discussed with the patient the benefits, risks, side effects, alternatives, likelihood of achieving goals and potential problems during recovery for the procedure that I have provided  informed consent.    Procedure:   Myoneural Block  (Trigger Point injection)    Physician/Practitioner performing the procedure:   Alyxandria Wentz A. Barth Borne MD    Indication/Reason:   Musculoskeletal pain/myofascial pain secondary to trigger point   Provide equipment / supplies at bedside    Procedure tray: "Block Tray" (Disposable  single use) Skin infiltration needle: Regular 1.5-in, 25-G, (x1) Block Needle type: Regular Amount/quantity: 1 Size: Short(1.5-inch) Gauge: 25G    Standing Status:   Standing    Number of Occurrences:   1    Specify:   Block Tray   Chronic Opioid Analgesic:  Oxycodone IR 5 mg, 1 tab PO q 8 hrs (15 mg/day of oxycodone) MME/day: 22.5 mg/day.     Medications ordered for procedure: Meds ordered this encounter  Medications   ropivacaine (PF) 2 mg/mL (0.2%) (NAROPIN) injection 4 mL   triamcinolone acetonide (KENALOG-40) injection 40 mg   Medications administered: We administered ropivacaine (PF) 2 mg/mL (0.2%) and triamcinolone acetonide.  See the medical record for exact dosing, route, and time of administration.  Follow-up plan:   Return in about 2 weeks (around 04/15/2024) for (Face2F), (PPE).       Interventional Therapies  Risk Factors  Considerations  Medical Comorbidities:  Parkinson's disease  Hx right breast cancer  OIC  SOB  CKD Stage 3b  GERD  depression  osteopenia/osteoporosis  PSVT  Memory impairment      Planned  Pending:   Left infraspinatus trigger point injection x1 (04/01/2024)    Under consideration:  Therapeutic right suprascapular nerve RFA #1  Possible intrathecal pump trial and permanent implant    Completed:   Therapeutic bilateral lumbar facet Blk x15 (03/20/2024)  Therapeutic bilateral lumbar facet RFA x R4L2  Diagnostic/therapeutic right suprascapular NB x2  Therapeutic bilateral trapezius/thoracic TPI x R6L1  Therapeutic right lumbar TPI x1  Therapeutic bilateral SI inj. x R3L2  Therapeutic right SI RFA x4  Therapeutic right knee inj. (steroid x5)  (Zilretta x3) (Monovisc x1) (pes anserine x2) (12/06/2023)  Therapeutic TFESI (bilateral: L5, L2 x1) (right: L5 x4, L4 x1, L3 x1)  Therapeutic LESI (midline: L2-3 x1) (right: L3-4 x2, L4-5 x2, L5-S1 x2)    Therapeutic  Palliative (PRN) options:   Do not schedule any procedures without a proper preprocedure evaluation.   Completed by other providers:   Therapeutic right knee (ORTHOVISC) inj. x3 (05/18/2023, 05/23/2023, 05/30/2023) by Mickeal Aland, MD [facility]) Therapeutic left knee (Kenalog) inj. x1 (08/25/2020) by Candise Chambers, MD [facility])   Pharmacotherapy  Nonopioids transfer 12/21/2020: Gabapentin       Recent Visits Date Type Provider Dept  03/26/24 Office Visit Renaldo Caroli, MD [facility]  03/20/24 Procedure visit Renaldo Caroli, MD [facility]  02/11/24 Office Visit Renaldo Caroli, MD [facility]  02/05/24 Procedure visit Renaldo Caroli, MD [facility]  01/29/24 Office Visit Renaldo Caroli, MD [facility]  01/15/24 Procedure visit Renaldo Caroli, MD [facility]  Showing recent visits within past 90 days and meeting all other requirements Today's Visits Date Type Provider Dept  04/01/24 Office Visit Renaldo Caroli, MD [facility]  Showing today's visits and meeting all other requirements Future Appointments Date Type Provider Dept  04/22/24 Appointment Renaldo Caroli, MD [facility]  05/07/24 Appointment Renaldo Caroli, MD [facility]  Showing future appointments within next 90 days and meeting all other requirements  Disposition: Discharge home  Discharge (Date  Time): 04/01/2024; 1501 hrs.  Primary Care Physician: Dellar Fenton, MD Location: [facility] Note by: Candi Chafe, MD (TTS technology used. I apologize for any typographical errors that were not detected and corrected.) Date:  04/01/2024; Time: 3:53 PM  Disclaimer:  Medicine is not an Visual merchandiser. The only guarantee in medicine is that nothing is guaranteed. It is important to note that the decision to proceed with this intervention was based on the information collected from the patient. The Data and conclusions were drawn from the patient's questionnaire, the interview, and the physical examination. Because the information was provided in large part by the patient, it cannot be guaranteed that it has not been purposely or unconsciously manipulated. Every effort has been made to obtain as much relevant data as possible for this evaluation. It is important to note that the conclusions that lead to this procedure are derived in large part from the available data. Always take into account that the treatment will also be dependent on availability of resources and existing treatment guidelines, considered by other Pain Management Practitioners as being common knowledge and practice, at the time of the intervention. For Medico-Legal purposes, it is also important to point out that variation in procedural techniques and pharmacological choices are the acceptable norm. The indications, contraindications, technique, and results of the above procedure should only be interpreted and judged by a Board-Certified Interventional Pain Specialist with extensive familiarity and expertise in the same exact procedure and technique.

## 2024-04-03 ENCOUNTER — Ambulatory Visit (INDEPENDENT_AMBULATORY_CARE_PROVIDER_SITE_OTHER): Payer: Medicare Other | Admitting: Internal Medicine

## 2024-04-03 VITALS — BP 122/74 | HR 85 | Temp 98.0°F | Resp 16 | Ht 63.0 in | Wt 155.0 lb

## 2024-04-03 DIAGNOSIS — I700 Atherosclerosis of aorta: Secondary | ICD-10-CM

## 2024-04-03 DIAGNOSIS — F320 Major depressive disorder, single episode, mild: Secondary | ICD-10-CM | POA: Diagnosis not present

## 2024-04-03 DIAGNOSIS — K219 Gastro-esophageal reflux disease without esophagitis: Secondary | ICD-10-CM

## 2024-04-03 DIAGNOSIS — E7800 Pure hypercholesterolemia, unspecified: Secondary | ICD-10-CM

## 2024-04-03 DIAGNOSIS — E871 Hypo-osmolality and hyponatremia: Secondary | ICD-10-CM | POA: Diagnosis not present

## 2024-04-03 DIAGNOSIS — R531 Weakness: Secondary | ICD-10-CM | POA: Diagnosis not present

## 2024-04-03 DIAGNOSIS — I10 Essential (primary) hypertension: Secondary | ICD-10-CM

## 2024-04-03 DIAGNOSIS — M545 Low back pain, unspecified: Secondary | ICD-10-CM | POA: Diagnosis not present

## 2024-04-03 DIAGNOSIS — G8929 Other chronic pain: Secondary | ICD-10-CM | POA: Diagnosis not present

## 2024-04-03 DIAGNOSIS — I471 Supraventricular tachycardia, unspecified: Secondary | ICD-10-CM

## 2024-04-03 DIAGNOSIS — D638 Anemia in other chronic diseases classified elsewhere: Secondary | ICD-10-CM | POA: Diagnosis not present

## 2024-04-03 DIAGNOSIS — R739 Hyperglycemia, unspecified: Secondary | ICD-10-CM

## 2024-04-03 NOTE — Progress Notes (Signed)
 Subjective:    Patient ID: Wanda Martinez, female    DOB: 23-Sep-1935, 89 y.o.   MRN: [id number]  Patient here for  Chief Complaint  Patient presents with   Medical Management of Chronic Issues    HPI Here for a scheduled follow up - follow up regarding recent ER visit. In ER 03/25/24 - increased left shoulder pain. Also hospitalized 03/12/24 - 03/16/24 - intractable back pain. Is s/p lumbar facet, medial branch block - which has helped her back pain. She was recently seen by pain clinic and s/p shoulder injection. Still with shoulder pain. Simulator on hold for right now. She is at home now. Limited mobility - due to weakness and pain. Trying to use her walker. Some increased depression with not being able to get around and do things she wants to do. No SI. Discussed palliative care. Order has been placed for another referral. Discussed PT/OT. Discussed aid and meals on wheels. Breathing stable. She is eating. No vomiting. No bowel issues reported.    Past Medical History:  Diagnosis Date   Acute postoperative pain 10/25/2017   Anemia    Arm pain 07/26/2015   Arthritis    Arthritis, degenerative 03/26/2014   Back pain 11/01/2013   Bladder infection 06/2018   Breast cancer (HCC)    Masectomy - left - 1986    Breast cancer [facility])    Mastectomy-right -2014   CHEST PAIN 04/29/2010   Qualifier: Diagnosis of  By: Lincoln Renshaw RN, Erika     Chronic cystitis    Cystocele 02/22/2013   Cystocele, midline 08/19/2013   Degeneration of intervertebral disc of lumbosacral region 03/26/2014   DYSPNEA 04/29/2010   Qualifier: Diagnosis of  By: Lincoln Renshaw RN, Erika     Enthesopathy of hip 03/26/2014   GERD (gastroesophageal reflux disease)    Hiatal hernia    HTN (hypertension)    Hypokalemia 06/2018   Hyponatremia 06/2018   LBP (low back pain) 03/26/2014   Neck pain 11/01/2013   Parkinson disease (HCC)    Pneumonia 06/2018   Sinusitis 02/07/2015   Skin lesions 07/12/2014   Urinary incontinence    mixed     Past Surgical History:  Procedure Laterality Date   ABDOMINAL HYSTERECTOMY     BLEPHAROPLASTY     BREAST BIOPSY  2013   BREAST ENHANCEMENT SURGERY  1987   BREAST IMPLANT REMOVAL     BREAST IMPLANT REMOVAL Right 08/29/2012   BREAST SURGERY  1986   s/p left mastectomy   COLONOSCOPY WITH PROPOFOL  N/A 09/13/2016   Procedure: COLONOSCOPY WITH PROPOFOL ;  Surgeon: Cassie Click, MD;  Location: [facility] ENDOSCOPY;  Service: Endoscopy;  Laterality: N/A;   MASTECTOMY  08/2012   right   TONSILLECTOMY AND ADENOIDECTOMY  1979   VESICOVAGINAL FISTULA CLOSURE W/ TAH  1983   Family History  Problem Relation Age of Onset   Diabetes Father    Stroke Father    Colon polyps Father    Stroke Mother    Parkinson's disease Mother    Social History   Socioeconomic History   Marital status: Married    Spouse name: Not on file   Number of children: 3   Years of education: Not on file   Highest education level: Not on file  Occupational History   Not on file  Tobacco Use   Smoking status: Never    Passive exposure: Never   Smokeless tobacco: Never   Tobacco comments:    tobacco use - no  Vaping Use   Vaping status: Never Used  Substance and Sexual Activity   Alcohol use: No    Alcohol/week: 0.0 standard drinks of alcohol   Drug use: Yes    Comment: prescribed   Sexual activity: Never  Other Topics Concern   Not on file  Social History Narrative   married   Social Drivers of Corporate investment banker Strain: Low Risk  (01/16/2024)   Overall Financial Resource Strain (CARDIA)    Difficulty of Paying Living Expenses: Not hard at all  Food Insecurity: No Food Insecurity (03/17/2024)   Hunger Vital Sign    Worried About Running Out of Food in the Last Year: Never true    Ran Out of Food in the Last Year: Never true  Transportation Needs: No Transportation Needs (03/17/2024)   PRAPARE - Administrator, Civil Service (Medical): No    Lack of Transportation (Non-Medical):  No  Physical Activity: Inactive (01/16/2024)   Exercise Vital Sign    Days of Exercise per Week: 0 days    Minutes of Exercise per Session: 0 min  Stress: No Stress Concern Present (01/16/2024)   [employer] of Occupational Health - Occupational Stress Questionnaire    Feeling of Stress : Only a little  Social Connections: Moderately Integrated (03/13/2024)   Social Connection and Isolation Panel [NHANES]    Frequency of Communication with Friends and Family: More than three times a week    Frequency of Social Gatherings with Friends and Family: More than three times a week    Attends Religious Services: More than 4 times per year    Active Member of [employer] or Organizations: No    Attends Banker Meetings: Never    Marital Status: Married     Review of Systems  Constitutional:  Negative for appetite change and fever.  HENT:  Negative for congestion and sinus pressure.   Respiratory:  Negative for cough, chest tightness and shortness of breath.   Cardiovascular:  Negative for chest pain, palpitations and leg swelling.  Gastrointestinal:  Negative for abdominal pain, diarrhea, nausea and vomiting.  Genitourinary:  Negative for difficulty urinating and dysuria.  Musculoskeletal:  Negative for myalgias.       Shoulder pain as outlined. Back pain is better.   Skin:  Negative for color change and rash.  Neurological:  Negative for dizziness and headaches.  Psychiatric/Behavioral:  Negative for agitation.        Increased stress. Some depression.        Objective:     BP 122/74   Pulse 85   Temp 98 F (36.7 C)   Resp 16   Ht 5\' 3"  (1.6 m)   Wt 155 lb (70.3 kg)   LMP 12/18/1981   SpO2 98%   BMI 27.46 kg/m  Wt Readings from Last 3 Encounters:  04/03/24 155 lb (70.3 kg)  04/01/24 156 lb (70.8 kg)  03/25/24 150 lb (68 kg)    Physical Exam Constitutional:      General: She is not in acute distress.    Appearance: Normal appearance.  HENT:     Head:  Normocephalic and atraumatic.     Nose: Nose normal.     Mouth/Throat:     Pharynx: No oropharyngeal exudate or posterior oropharyngeal erythema.  Neck:     Thyroid : No thyromegaly.  Cardiovascular:     Rate and Rhythm: Normal rate and regular rhythm.  Pulmonary:     Effort: No respiratory  distress.     Breath sounds: Normal breath sounds. No wheezing.  Abdominal:     General: Bowel sounds are normal.     Palpations: Abdomen is soft.     Tenderness: There is no abdominal tenderness.  Musculoskeletal:        General: No swelling or tenderness.     Cervical back: Neck supple. No tenderness.  Lymphadenopathy:     Cervical: No cervical adenopathy.  Skin:    Findings: No erythema or rash.  Neurological:     Mental Status: She is alert.  Psychiatric:        Mood and Affect: Mood normal.        Behavior: Behavior normal.         Outpatient Encounter Medications as of 04/03/2024  Medication Sig   acetaminophen  (TYLENOL ) 325 MG tablet Take 2 tablets (650 mg total) by mouth every 6 (six) hours as needed for mild pain (pain score 1-3) (or Fever >/= 101).   amLODipine  (NORVASC ) 5 MG tablet Take 1 tablet (5 mg total) by mouth daily.   aspirin 81 MG EC tablet Take 81 mg by mouth daily as needed.   calcium  carbonate (OSCAL) 1500 (600 Ca) MG TABS tablet Take 1 tablet by mouth daily.   citalopram  (CELEXA ) 10 MG tablet Take 10 mg by mouth daily. Take long with one 20 mg tablet for total 30 mg once daily   citalopram  (CELEXA ) 20 MG tablet TAKE 1 TABLET BY MOUTH ONCE DAILY. (Patient taking differently: Take 20 mg by mouth daily. Take along with one 10 mg tablet for total 30 mg once daily)   donepezil  (ARICEPT ) 10 MG tablet TAKE ONE TABLET BY MOUTH AT BEDTIME   gabapentin  (NEURONTIN ) 100 MG capsule Take 2 capsules (200 mg total) by mouth 3 (three) times daily.   Glucosamine-Chondroitin 500-400 MG CAPS Take 2 capsules by mouth daily.   hydrochlorothiazide  (HYDRODIURIL ) 12.5 MG tablet Take 1  tablet (12.5 mg total) by mouth every morning.   losartan  (COZAAR ) 50 MG tablet TAKE ONE TABLET BY MOUTH ONCE DAILY   magnesium  oxide (MAG-OX) 400 (240 Mg) MG tablet TAKE ONE TABLET BY MOUTH EVERY DAY   melatonin 1 MG TABS tablet Take 3 mg by mouth at bedtime.   meloxicam  (MOBIC ) 15 MG tablet Take 1 tablet (15 mg total) by mouth daily.   methocarbamol  (ROBAXIN ) 500 MG tablet Take 1 tablet (500 mg total) by mouth every 8 (eight) hours as needed for muscle spasms.   metoprolol  succinate (TOPROL -XL) 25 MG 24 hr tablet Take 1 tablet (25 mg total) by mouth 2 (two) times daily.   mirabegron  ER (MYRBETRIQ ) 50 MG TB24 tablet Take 1 tablet (50 mg total) by mouth daily.   Multiple Vitamin (MULTIVITAMIN) tablet Take 1 tablet by mouth daily.   naloxone  (NARCAN ) nasal spray 4 mg/0.1 mL Place 1 spray into the nose as needed for up to 365 doses (for opioid-induced respiratory depresssion). In case of emergency (overdose), spray once into each nostril. If no response within 3 minutes, repeat application and call 911.   Omega-3 1000 MG CAPS Take by mouth.   [START ON 04/12/2024] oxyCODONE  (OXY IR/ROXICODONE ) 5 MG immediate release tablet Take 1 tablet (5 mg total) by mouth every 6 (six) hours as needed for severe pain (pain score 7-10) or breakthrough pain (PRN for post-radiofrequency pain). Must last 30 days.   pantoprazole  (PROTONIX ) 40 MG tablet TAKE ONE TABLET BY MOUTH EVERY DAY   polyethylene glycol (MIRALAX  / GLYCOLAX ) 17  g packet Take 17 g by mouth daily as needed for severe constipation.   predniSONE  (DELTASONE ) 10 MG tablet 4 tabs po day 1; 3 tabs po day 2,3; 2 tabs po day 4,5; 1 tab po day 6,7; 1/2 tab po day 8,9,10,11   rosuvastatin  (CRESTOR ) 20 MG tablet TAKE ONE TABLET BY MOUTH AT BEDTIME   No facility-administered encounter medications on file as of 04/03/2024.     Lab Results  Component Value Date   WBC 11.3 (H) 03/25/2024   HGB 12.2 03/25/2024   HCT 35.3 (L) 03/25/2024   PLT 207 03/25/2024    GLUCOSE 82 04/03/2024   CHOL 127 02/18/2024   TRIG 91.0 02/18/2024   HDL 49.30 02/18/2024   LDLDIRECT 73.0 07/09/2018   LDLCALC 59 02/18/2024   ALT 21 02/18/2024   AST 19 02/18/2024   NA 131 (L) 04/03/2024   K 4.5 04/03/2024   CL 96 (L) 04/03/2024   CREATININE 0.95 04/03/2024   BUN 35 (H) 04/03/2024   CO2 25 04/03/2024   TSH 2.86 09/11/2023   HGBA1C 5.7 02/18/2024    DG Shoulder Left Result Date: 03/25/2024 CLINICAL DATA:  Pain after injury. EXAM: LEFT SHOULDER - 2+ VIEW COMPARISON:  None Available. FINDINGS: There is no evidence of fracture or dislocation. Minor acromioclavicular degenerative spurring. No erosions or focal bone abnormality. Soft tissues are unremarkable. IMPRESSION: 1. No fracture or dislocation of the left shoulder. 2. Minor acromioclavicular degenerative spurring. Electronically Signed   By: Chadwick Colonel M.D.   On: 03/25/2024 21:28       Assessment & Plan:  Hyponatremia Assessment & Plan: Sodium on recent check in ER - 129. Eating. Discussed the need to recheck. Recheck met b today.   Orders: -     Basic metabolic panel with GFR  Anemia of chronic disease Assessment & Plan: 03/25/24 - hgb wnl.    Aortic atherosclerosis (HCC) Assessment & Plan: Continue crestor .    Depression, major, single episode, mild (HCC) Assessment & Plan: Continue citalopram . Some increased depression as outlined. No SI. Has good support. Follow.    Gastroesophageal reflux disease, unspecified whether esophagitis present Assessment & Plan: No upper symptoms reported. Continue PPI.    Hypercholesterolemia Assessment & Plan: Continue crestor . Follow lipid panel and liver function tests.    Hyperglycemia Assessment & Plan: Low carb diet and exercise. Follow met b and A1c.    Hypertension, unspecified type Assessment & Plan: Continues on metoprolol , amlodipine  and losartan . Follow pressures. No changes in medication today. Check metabolic panel. If persistent  issues with low sodium, will need to stop hydrochlorothiazide .    Chronic low back pain, unspecified back pain laterality, unspecified whether sciatica present Assessment & Plan: Back pain is better s/p lumbar facet, medial branch block as outlined. Continue f/u with pain clinic.    PSVT (paroxysmal supraventricular tachycardia) (HCC) Assessment & Plan: History of paroxysmal SVT. ECHO 06/2023 - EF 55069%. Continue toprol . Stable.    Weakness Assessment & Plan: Weakness as outlined. Have placed order for palliative care. Plan for PT. Follow.       Dellar Fenton, MD

## 2024-04-04 ENCOUNTER — Encounter: Payer: Self-pay | Admitting: Internal Medicine

## 2024-04-04 DIAGNOSIS — M545 Low back pain, unspecified: Secondary | ICD-10-CM | POA: Diagnosis not present

## 2024-04-04 DIAGNOSIS — K5900 Constipation, unspecified: Secondary | ICD-10-CM | POA: Diagnosis not present

## 2024-04-04 DIAGNOSIS — Z9181 History of falling: Secondary | ICD-10-CM | POA: Diagnosis not present

## 2024-04-04 DIAGNOSIS — M7062 Trochanteric bursitis, left hip: Secondary | ICD-10-CM | POA: Diagnosis not present

## 2024-04-04 DIAGNOSIS — D649 Anemia, unspecified: Secondary | ICD-10-CM | POA: Diagnosis not present

## 2024-04-04 DIAGNOSIS — G8929 Other chronic pain: Secondary | ICD-10-CM | POA: Diagnosis not present

## 2024-04-04 DIAGNOSIS — Z853 Personal history of malignant neoplasm of breast: Secondary | ICD-10-CM | POA: Diagnosis not present

## 2024-04-04 DIAGNOSIS — E871 Hypo-osmolality and hyponatremia: Secondary | ICD-10-CM | POA: Diagnosis not present

## 2024-04-04 DIAGNOSIS — I10 Essential (primary) hypertension: Secondary | ICD-10-CM | POA: Diagnosis not present

## 2024-04-04 DIAGNOSIS — M25511 Pain in right shoulder: Secondary | ICD-10-CM | POA: Diagnosis not present

## 2024-04-04 DIAGNOSIS — M461 Sacroiliitis, not elsewhere classified: Secondary | ICD-10-CM | POA: Diagnosis not present

## 2024-04-04 DIAGNOSIS — M25561 Pain in right knee: Secondary | ICD-10-CM | POA: Diagnosis not present

## 2024-04-04 LAB — BASIC METABOLIC PANEL WITH GFR
BUN/Creatinine Ratio: 37 (calc) — ABNORMAL HIGH (ref 6–22)
BUN: 35 mg/dL — ABNORMAL HIGH (ref 7–25)
CO2: 25 mmol/L (ref 20–32)
Calcium: 9.1 mg/dL (ref 8.6–10.4)
Chloride: 96 mmol/L — ABNORMAL LOW (ref 98–110)
Creat: 0.95 mg/dL (ref 0.60–0.95)
Glucose, Bld: 82 mg/dL (ref 65–99)
Potassium: 4.5 mmol/L (ref 3.5–5.3)
Sodium: 131 mmol/L — ABNORMAL LOW (ref 135–146)
eGFR: 58 mL/min/{1.73_m2} — ABNORMAL LOW (ref >=60)

## 2024-04-04 NOTE — Assessment & Plan Note (Signed)
 Weakness as outlined. Have placed order for palliative care. Plan for PT. Follow.

## 2024-04-04 NOTE — Assessment & Plan Note (Signed)
 Continue crestor

## 2024-04-04 NOTE — Assessment & Plan Note (Signed)
 03/25/24 - hgb wnl.

## 2024-04-04 NOTE — Assessment & Plan Note (Signed)
 Continue citalopram . Some increased depression as outlined. No SI. Has good support. Follow.

## 2024-04-04 NOTE — Assessment & Plan Note (Signed)
 Continues on metoprolol , amlodipine  and losartan . Follow pressures. No changes in medication today. Check metabolic panel. If persistent issues with low sodium, will need to stop hydrochlorothiazide .

## 2024-04-04 NOTE — Assessment & Plan Note (Signed)
 Continue crestor. Follow lipid panel and liver function tests.

## 2024-04-04 NOTE — Assessment & Plan Note (Signed)
 History of paroxysmal SVT. ECHO 06/2023 - EF 55069%. Continue toprol . Stable.

## 2024-04-04 NOTE — Assessment & Plan Note (Signed)
 Back pain is better s/p lumbar facet, medial branch block as outlined. Continue f/u with pain clinic.

## 2024-04-04 NOTE — Assessment & Plan Note (Signed)
 Low-carb diet and exercise.  Follow met b and A1c.

## 2024-04-04 NOTE — Assessment & Plan Note (Signed)
No upper symptoms reported.  Continue PPI.  ?

## 2024-04-04 NOTE — Assessment & Plan Note (Signed)
 Sodium on recent check in ER - 129. Eating. Discussed the need to recheck. Recheck met b today.

## 2024-04-07 ENCOUNTER — Other Ambulatory Visit: Payer: Self-pay

## 2024-04-07 DIAGNOSIS — E871 Hypo-osmolality and hyponatremia: Secondary | ICD-10-CM

## 2024-04-07 NOTE — Progress Notes (Signed)
Sodium order placed.

## 2024-04-08 ENCOUNTER — Ambulatory Visit: Admitting: Pain Medicine

## 2024-04-10 ENCOUNTER — Other Ambulatory Visit: Payer: Self-pay | Admitting: Internal Medicine

## 2024-04-11 ENCOUNTER — Telehealth: Payer: Self-pay

## 2024-04-11 DIAGNOSIS — M25561 Pain in right knee: Secondary | ICD-10-CM | POA: Diagnosis not present

## 2024-04-11 DIAGNOSIS — M25511 Pain in right shoulder: Secondary | ICD-10-CM | POA: Diagnosis not present

## 2024-04-11 DIAGNOSIS — Z9181 History of falling: Secondary | ICD-10-CM | POA: Diagnosis not present

## 2024-04-11 DIAGNOSIS — G8929 Other chronic pain: Secondary | ICD-10-CM | POA: Diagnosis not present

## 2024-04-11 DIAGNOSIS — M545 Low back pain, unspecified: Secondary | ICD-10-CM | POA: Diagnosis not present

## 2024-04-11 DIAGNOSIS — E871 Hypo-osmolality and hyponatremia: Secondary | ICD-10-CM | POA: Diagnosis not present

## 2024-04-11 DIAGNOSIS — M7062 Trochanteric bursitis, left hip: Secondary | ICD-10-CM | POA: Diagnosis not present

## 2024-04-11 DIAGNOSIS — M461 Sacroiliitis, not elsewhere classified: Secondary | ICD-10-CM | POA: Diagnosis not present

## 2024-04-11 DIAGNOSIS — K5900 Constipation, unspecified: Secondary | ICD-10-CM | POA: Diagnosis not present

## 2024-04-11 DIAGNOSIS — Z853 Personal history of malignant neoplasm of breast: Secondary | ICD-10-CM | POA: Diagnosis not present

## 2024-04-11 DIAGNOSIS — D649 Anemia, unspecified: Secondary | ICD-10-CM | POA: Diagnosis not present

## 2024-04-11 DIAGNOSIS — I10 Essential (primary) hypertension: Secondary | ICD-10-CM | POA: Diagnosis not present

## 2024-04-11 NOTE — Telephone Encounter (Signed)
 Copied from CRM [id number]. Topic: General - Other >> Apr 11, 2024  3:56 PM Allyne Areola wrote: Reason for CRM: Mika from [facility] home health is calling to report a missed physical therapy appointment with the patient today.

## 2024-04-14 ENCOUNTER — Telehealth: Payer: Self-pay | Admitting: Pain Medicine

## 2024-04-14 NOTE — Telephone Encounter (Signed)
 PT called stated that she is having pain in her shoulder, will like to know if Barth Borne will allow her to come in on tomorrow for injections

## 2024-04-14 NOTE — Telephone Encounter (Signed)
 Noted.

## 2024-04-17 ENCOUNTER — Encounter: Payer: Self-pay | Admitting: Pain Medicine

## 2024-04-17 ENCOUNTER — Ambulatory Visit
Admission: RE | Admit: 2024-04-17 | Discharge: 2024-04-17 | Disposition: A | Discharge status: Home / Self Care | Source: Ambulatory Visit | Attending: Pain Medicine | Admitting: Pain Medicine

## 2024-04-17 ENCOUNTER — Ambulatory Visit: Attending: Pain Medicine | Admitting: Pain Medicine

## 2024-04-17 VITALS — BP 129/71 | HR 77 | Temp 97.3°F | Resp 15 | Ht 63.0 in | Wt 155.0 lb

## 2024-04-17 DIAGNOSIS — M19012 Primary osteoarthritis, left shoulder: Secondary | ICD-10-CM | POA: Diagnosis not present

## 2024-04-17 DIAGNOSIS — G8929 Other chronic pain: Secondary | ICD-10-CM

## 2024-04-17 DIAGNOSIS — M25512 Pain in left shoulder: Secondary | ICD-10-CM | POA: Diagnosis not present

## 2024-04-17 DIAGNOSIS — Z5189 Encounter for other specified aftercare: Secondary | ICD-10-CM

## 2024-04-17 DIAGNOSIS — Z09 Encounter for follow-up examination after completed treatment for conditions other than malignant neoplasm: Secondary | ICD-10-CM

## 2024-04-17 MED ORDER — LIDOCAINE HCL 2 % IJ SOLN
20.0000 mL | Freq: Once | INTRAMUSCULAR | Status: AC
Start: 2024-04-17 — End: 2024-04-17
  Administered 2024-04-17: 400 mg
  Filled 2024-04-17: qty 20

## 2024-04-17 MED ORDER — ROPIVACAINE HCL 2 MG/ML IJ SOLN
9.0000 mL | Freq: Once | INTRAMUSCULAR | Status: AC
  Administered 2024-04-17: 9 mL via INTRA_ARTICULAR
  Filled 2024-04-17: qty 20

## 2024-04-17 MED ORDER — PENTAFLUOROPROP-TETRAFLUOROETH EX AERO
INHALATION_SPRAY | Freq: Once | CUTANEOUS | Status: AC
  Administered 2024-04-17: 30 via TOPICAL

## 2024-04-17 MED ORDER — METHYLPREDNISOLONE ACETATE 80 MG/ML IJ SUSP
80.0000 mg | Freq: Once | INTRAMUSCULAR | Status: AC
Start: 2024-04-17 — End: 2024-04-17
  Administered 2024-04-17: 80 mg via INTRA_ARTICULAR
  Filled 2024-04-17: qty 1

## 2024-04-17 NOTE — Patient Instructions (Signed)

## 2024-04-17 NOTE — Progress Notes (Signed)
 Safety precautions to be maintained throughout the outpatient stay will include: orient to surroundings, keep bed in low position, maintain call bell within reach at all times, provide assistance with transfer out of bed and ambulation.

## 2024-04-17 NOTE — Progress Notes (Signed)
 PROVIDER NOTE: Interpretation of information contained herein should be left to medically-trained personnel. Specific patient instructions are provided elsewhere under "Patient Instructions" section of medical record. This document was created in part using STT-dictation technology, any transcriptional errors that may result from this process are unintentional.  Patient: Wanda Martinez Type: Established DOB: June 05, 1935 MRN: [id number] PCP: Dellar Fenton, MD  Service: Procedure DOS: 04/17/2024 Setting: Ambulatory Location: Ambulatory outpatient facility Delivery: Face-to-face Provider: Candi Chafe, MD Specialty: Interventional Pain Management Specialty designation: 09 Location: Outpatient facility Ref. Prov.: Dellar Fenton, MD       Interventional Therapy   Procedure: Suprascapular nerve block (SSNB) #1  Laterality:  Left  Level: Superior to scapular spine, lateral to supraspinatus fossa (Suprascapular notch).  Imaging: Fluoroscopic guidance Non-spinal ([id number]) Anesthesia: Local anesthesia (1-2% Lidocaine ) Anxiolysis: None                 Sedation: No Sedation                       DOS: 04/17/2024  Performed by: Candi Chafe, MD  Purpose: Diagnostic/Therapeutic Indications: Shoulder pain, severe enough to impact quality of life and/or function. 1. Acute pain of left shoulder   2. Chronic shoulder pain (Left)   3. Osteoarthritis of AC (acromioclavicular) joint (Left)   4. Osteoarthritis of shoulder (Left)   5. Trigger point of shoulder region (Left)   6. Encounter for therapeutic procedure   7. Postop check    NAS-11 score:   Pre-procedure: 10-Worst pain ever/10   Post-procedure: 0-No pain/10     Target: Suprascapular nerve Location: midway between the medial border of the scapula and the acromion as it runs through the suprascapular notch. Region: Suprascapular, posterior shoulder  Approach: Percutaneous  Neuroanatomy: The suprascapular nerve is the  lateral branch of the superior trunk of the brachial plexus. It receives nerve fibers that originate in the nerve roots C5 and C6 (and sometimes C4). It is a mixed nerve, meaning that it provides both sensory and motor supply for the suprascapular region. Function: The main function of this nerve is to provide motor innervation for two muscles, the supraspinatus and infraspinatus muscles. They are part of the rotator cuff muscles. In addition, the suprascapular nerve provides a sensory supply to the joints of the scapula (glenohumeral and acromioclavicular joints). Rationale (medical necessity): procedure needed and proper for the diagnosis and/or treatment of the patient's medical symptoms and needs.  Position / Prep / Materials:  Position: Prone Materials:  Tray: Block Needle(s):  Type: Spinal  Gauge (G): 22  Length: 3.5 in.  Qty: 1 Prep solution: ChloraPrep (2% chlorhexidine gluconate and 70% isopropyl alcohol) Prep Area: Entire posterior shoulder area. From upper spine to shoulder proper (upper arm), and from lateral neck to lower tip of shoulder blade.   Post-procedure evaluation    Type: Infraspinatus Trigger Point Injection (Myoneural Block) (1-2 muscle groups)  #1 (w/ steroids)  CPT: 20552 Laterality: Left (-LT)   Imaging: N/A. Landmark-guided",           Anesthesia: Local anesthesia (1-2% Lidocaine ) Anxiolysis: None                 Sedation:                         DOS: 04/01/2024  Performed by: Candi Chafe, MD  Medical Necessity (reasoning)  Purpose: Diagnostic/Therapeutic Rationale (medical necessity): procedure needed and proper for the diagnosis and/or  treatment of [name] medical symptoms and needs. Indications: Left posterior shoulder pain severe enough to impact quality of life and/or function. 1. Acute pain of left shoulder   2. Chronic shoulder pain (Left)   3. Osteoarthritis of left AC (acromioclavicular) joint   4. Arthralgia of left  acromioclavicular joint   5. Primary osteoarthritis of left shoulder   6. Trigger point of shoulder region (Left)    NAS-11 Pain score:   Pre-procedure: 9 /10   Post-procedure: 9 /10      Effectiveness:  Initial hour after procedure: 0 %. Subsequent 4-6 hours post-procedure: 0 %. Analgesia past initial 6 hours: 0 %.  H&P (Pre-op Assessment):  Wanda Martinez is a 89 y.o. (year old), female patient, seen today for interventional treatment. She  has a past surgical history that includes Tonsillectomy and adenoidectomy (1979); Vesicovaginal fistula closure w/ TAH (1983); Breast surgery (1986); Breast enhancement surgery (1987); Breast implant removal; Breast implant removal (Right, 08/29/2012); Mastectomy (08/2012); Abdominal hysterectomy; Colonoscopy with propofol  (N/A, 09/13/2016); Breast biopsy (2013); and Blepharoplasty. Wanda Martinez has a current medication list which includes the following prescription(s): acetaminophen , amlodipine , aspirin ec, calcium  carbonate, citalopram , citalopram , donepezil , gabapentin , glucosamine-chondroitin, hydrochlorothiazide , losartan , magnesium  oxide, melatonin, meloxicam , methocarbamol , metoprolol  succinate, mirabegron  er, multivitamin, naloxone , omega-3, oxycodone , pantoprazole , polyethylene glycol, prednisone , and rosuvastatin . Her primarily concern today is the Shoulder Pain (left)  Initial Vital Signs:  Pulse/HCG Rate: 65ECG Heart Rate: (!) 18 Temp: (!) 97.3 F (36.3 C) Resp: 13 BP: 107/70 SpO2: 98 %  BMI: Estimated body mass index is 27.46 kg/m as calculated from the following:   Height as of this encounter: 5\' 3"  (1.6 m).   Weight as of this encounter: 155 lb (70.3 kg).  Risk Assessment: Allergies: Reviewed. She is allergic to sulfa antibiotics and vesicare [solifenacin].  Allergy Precautions: None required Coagulopathies: Reviewed. None identified.  Blood-thinner therapy: None at this time Active Infection(s): Reviewed. None identified. Wanda Martinez  is afebrile  Site Confirmation: Wanda Martinez was asked to confirm the procedure and laterality before marking the site Procedure checklist: Completed Consent: Before the procedure and under the influence of no sedative(s), amnesic(s), or anxiolytics, the patient was informed of the treatment options, risks and possible complications. To fulfill our ethical and legal obligations, as recommended by the American Medical Association's Code of Ethics, I have informed the patient of my clinical impression; the nature and purpose of the treatment or procedure; the risks, benefits, and possible complications of the intervention; the alternatives, including doing nothing; the risk(s) and benefit(s) of the alternative treatment(s) or procedure(s); and the risk(s) and benefit(s) of doing nothing. The patient was provided information about the general risks and possible complications associated with the procedure. These may include, but are not limited to: failure to achieve desired goals, infection, bleeding, organ or nerve damage, allergic reactions, paralysis, and death. In addition, the patient was informed of those risks and complications associated to the procedure, such as failure to decrease pain; infection; bleeding; organ or nerve damage with subsequent damage to sensory, motor, and/or autonomic systems, resulting in permanent pain, numbness, and/or weakness of one or several areas of the body; allergic reactions; (i.e.: anaphylactic reaction); and/or death. Furthermore, the patient was informed of those risks and complications associated with the medications. These include, but are not limited to: allergic reactions (i.e.: anaphylactic or anaphylactoid reaction(s)); adrenal axis suppression; blood sugar elevation that in diabetics may result in ketoacidosis or comma; water retention that in patients with history of congestive heart failure may  result in shortness of breath, pulmonary edema, and decompensation  with resultant heart failure; weight gain; swelling or edema; medication-induced neural toxicity; particulate matter embolism and blood vessel occlusion with resultant organ, and/or nervous system infarction; and/or aseptic necrosis of one or more joints. Finally, the patient was informed that Medicine is not an exact science; therefore, there is also the possibility of unforeseen or unpredictable risks and/or possible complications that may result in a catastrophic outcome. The patient indicated having understood very clearly. We have given the patient no guarantees and we have made no promises. Enough time was given to the patient to ask questions, all of which were answered to the patient's satisfaction. Ms. Mayernik has indicated that she wanted to continue with the procedure. Attestation: I, the ordering provider, attest that I have discussed with the patient the benefits, risks, side-effects, alternatives, likelihood of achieving goals, and potential problems during recovery for the procedure that I have provided informed consent. Date  Time: 04/17/2024  8:28 AM  Pre-Procedure Preparation:  Monitoring: As per clinic protocol. Respiration, ETCO2, SpO2, BP, heart rate and rhythm monitor placed and checked for adequate function Safety Precautions: Patient was assessed for positional comfort and pressure points before starting the procedure. Time-out: I initiated and conducted the "Time-out" before starting the procedure, as per protocol. The patient was asked to participate by confirming the accuracy of the "Time Out" information. Verification of the correct person, site, and procedure were performed and confirmed by me, the nursing staff, and the patient. "Time-out" conducted as per Joint Commission's Universal Protocol (UP.01.01.01). Time: 0844 Start Time: 0844 hrs.  Description of Procedure:          Procedural Technique Safety Precautions: Aspiration looking for blood return was conducted prior to  all injections. At no point did we inject any substances, as a needle was being advanced. No attempts were made at seeking any paresthesias. Safe injection practices and needle disposal techniques used. Medications properly checked for expiration dates. SDV (single dose vial) medications used. Description of the Procedure: Protocol guidelines were followed. The patient was placed in position over the procedure table. The target area was identified and the area prepped in the usual manner. Skin & deeper tissues infiltrated with local anesthetic. Appropriate amount of time allowed to pass for local anesthetics to take effect. The procedure needles were then advanced to the target area. Proper needle placement secured. Negative aspiration confirmed. Solution injected in intermittent fashion, asking for systemic symptoms every 0.5cc of injectate. The needles were then removed and the area cleansed, making sure to leave some of the prepping solution back to take advantage of its long term bactericidal properties.  Vitals:   04/17/24 0825 04/17/24 0839 04/17/24 0847 04/17/24 0854  BP: 107/70 139/81 129/71 129/71  Pulse: 65 79 76 77  Resp:  13  15  Temp: (!) 97.3 F (36.3 C)     SpO2: 98% 99% 100% 100%  Weight: 155 lb (70.3 kg)     Height: 5\' 3"  (1.6 m)        Start Time: 0844 hrs. End Time: 0849 hrs.  Imaging Guidance (Spinal):          Type of Imaging Technique: Fluoroscopy Guidance (Spinal) Indication(s): Fluoroscopy guidance for needle placement to enhance accuracy in procedures requiring precise needle localization for targeted delivery of medication in or near specific anatomical locations not easily accessible without such real-time imaging assistance. Exposure Time: Please see nurses notes. Contrast: None used. Fluoroscopic Guidance: I was personally present  during the use of fluoroscopy. "Tunnel Vision Technique" used to obtain the best possible view of the target area. Parallax error  corrected before commencing the procedure. "Direction-depth-direction" technique used to introduce the needle under continuous pulsed fluoroscopy. Once target was reached, antero-posterior, oblique, and lateral fluoroscopic projection used confirm needle placement in all planes. Images permanently stored in EMR. Interpretation: No contrast injected. I personally interpreted the imaging intraoperatively. Adequate needle placement confirmed in multiple planes. Permanent images saved into the patient's record.  Antibiotic Prophylaxis:   Anti-infectives (From admission, onward)    None      Indication(s): None identified  Post-operative Assessment:  Post-procedure Vital Signs:  Pulse/HCG Rate: 77(!) 18 Temp: (!) 97.3 F (36.3 C) Resp: 15 BP: 129/71 SpO2: 100 %  EBL: None  Complications: No immediate post-treatment complications observed by team, or reported by patient.  Note: The patient tolerated the entire procedure well. A repeat set of vitals were taken after the procedure and the patient was kept under observation following institutional policy, for this type of procedure. Post-procedural neurological assessment was performed, showing return to baseline, prior to discharge. The patient was provided with post-procedure discharge instructions, including a section on how to identify potential problems. Should any problems arise concerning this procedure, the patient was given instructions to immediately contact us , at any time, without hesitation. In any case, we plan to contact the patient by telephone for a follow-up status report regarding this interventional procedure.  Comments:  No additional relevant information.  Plan of Care (POC)  Orders:  Orders Placed This Encounter  Procedures   SHOULDER INJECTION    Scheduling Instructions:     Procedure: Intra-articular shoulder (Glenohumeral) joint and (AC) Acromioclavicular joint injection     Side: Left-sided     Level:  Glenohumeral joint and (AC) Acromioclavicular joint     Sedation: Patient's choice.     Timeframe: Today    Where will this procedure be performed?:   [facility]   DG [facility] C-ARM 1-60 MIN NO REPORT    Intraoperative interpretation by procedural physician at [facility].    Standing Status:   Standing    Number of Occurrences:   1    Reason for exam::   Assistance in needle guidance and placement for procedures requiring needle placement in or near specific anatomical locations not easily accessible without such assistance.   Nursing Instructions:    Please complete this patient's postprocedure evaluation.    Scheduling Instructions:     Please complete this patient's postprocedure evaluation.   Informed Consent Details: Physician/Practitioner Attestation; Transcribe to consent form and obtain patient signature    Note: Always confirm laterality of pain with Ms. Enrigue Harvard, before procedure.    Physician/Practitioner attestation of informed consent for procedure/surgical case:   I, the physician/practitioner, attest that I have discussed with the patient the benefits, risks, side effects, alternatives, likelihood of achieving goals and potential problems during recovery for the procedure that I have provided informed consent.    Procedure:   Intra-articular shoulder joint injection under fluoroscopic guidance    Physician/Practitioner performing the procedure:   Nysir Fergusson A. Barth Borne, MD    Indication/Reason:   Chronic shoulder pain secondary to shoulder arthropathy   Provide equipment / supplies at bedside    Procedure tray: "Block Tray" (Disposable  single use) Skin infiltration needle: Regular 1.5-in, 25-G, (x1) Block Needle type: Spinal Amount/quantity: 1 Size: Regular (3.5-inch) Gauge: 22G    Standing Status:   Standing  Number of Occurrences:   1    Specify:   Block Tray   Chronic Opioid Analgesic:   Oxycodone  IR 5 mg, 1 tab PO q 8 hrs (15 mg/day of  oxycodone ) MME/day: 22.5 mg/day.     Medications ordered for procedure: Meds ordered this encounter  Medications   lidocaine  (XYLOCAINE ) 2 % (with pres) injection 400 mg   pentafluoroprop-tetrafluoroeth (GEBAUERS) aerosol   ropivacaine  (PF) 2 mg/mL (0.2%) (NAROPIN ) injection 9 mL   methylPREDNISolone  acetate (DEPO-MEDROL ) injection 80 mg   Medications administered: We administered lidocaine , pentafluoroprop-tetrafluoroeth, ropivacaine  (PF) 2 mg/mL (0.2%), and methylPREDNISolone  acetate.  See the medical record for exact dosing, route, and time of administration.  Follow-up plan:   Return for ( ), Eval-day (M,W), (Face2F), (PPE).       Interventional Therapies  Risk Factors  Considerations  Medical Comorbidities:  Parkinson's disease  Hx right breast cancer  OIC  SOB  CKD Stage 3b  GERD  depression  osteopenia/osteoporosis  PSVT  Memory impairment      Planned  Pending:      Under consideration:  Therapeutic right suprascapular nerve RFA #1  Possible intrathecal pump trial and permanent implant    Completed:   Diagnostic left suprascapular NB x1 (04/17/2024) (10-0/10)  Diagnostic left infraspinatus TPI/MNB x1 (04/01/2024) (0/0/0/0)  Therapeutic bilateral lumbar facet Blk x15 (03/20/2024)  Therapeutic bilateral lumbar facet RFA x R4L2  Diagnostic/therapeutic right suprascapular NB x2  Therapeutic bilateral trapezius/thoracic TPI x R6L1  Therapeutic right lumbar TPI x1  Therapeutic bilateral SI inj. x R3L2  Therapeutic right SI RFA x4  Therapeutic right knee inj. (steroid x5) (Zilretta  x3) (Monovisc x1) (pes anserine x2) (12/06/2023)  Therapeutic TFESI (bilateral: L5, L2 x1) (right: L5 x4, L4 x1, L3 x1)  Therapeutic LESI (midline: L2-3 x1) (right: L3-4 x2, L4-5 x2, L5-S1 x2)    Therapeutic  Palliative (PRN) options:   Do not schedule any procedures without a proper preprocedure evaluation.   Completed by other providers:   Therapeutic right knee  (ORTHOVISC) inj. x3 (05/18/2023, 05/23/2023, 05/30/2023) by Mickeal Aland, MD [facility]) Therapeutic left knee (Kenalog ) inj. x1 (08/25/2020) by Candise Chambers, MD [facility])   Pharmacotherapy  Nonopioids transfer 12/21/2020: Gabapentin        Recent Visits Date Type Provider Dept  04/01/24 Office Visit Renaldo Caroli, MD [facility]  03/26/24 Office Visit Renaldo Caroli, MD [facility]  03/20/24 Procedure visit Renaldo Caroli, MD [facility]  02/11/24 Office Visit Renaldo Caroli, MD [facility]  02/05/24 Procedure visit Renaldo Caroli, MD [facility]  01/29/24 Office Visit Renaldo Caroli, MD [facility]  Showing recent visits within past 90 days and meeting all other requirements Today's Visits Date Type Provider Dept  04/17/24 Procedure visit Renaldo Caroli, MD [facility]  Showing today's visits and meeting all other requirements Future Appointments Date Type Provider Dept  05/07/24 Appointment Renaldo Caroli, MD [facility]  05/19/24 Appointment Renaldo Caroli, MD [facility]  Showing future appointments within next 90 days and meeting all other requirements  Disposition: Discharge home  Discharge (Date  Time): 04/17/2024; 0851 hrs.   Primary Care Physician: Dellar Fenton, MD Location: [facility] Note by: Candi Chafe, MD (TTS technology used. I apologize for any typographical errors that were not detected and corrected.) Date: 04/17/2024; Time: 10:34 AM  Disclaimer:  Medicine is not an Visual merchandiser. The only guarantee in medicine is that nothing is guaranteed. It is  important to note that the decision to proceed with this intervention was based on the information collected from the patient. The Data and conclusions were drawn from the patient's questionnaire, the interview, and the physical examination.  Because the information was provided in large part by the patient, it cannot be guaranteed that it has not been purposely or unconsciously manipulated. Every effort has been made to obtain as much relevant data as possible for this evaluation. It is important to note that the conclusions that lead to this procedure are derived in large part from the available data. Always take into account that the treatment will also be dependent on availability of resources and existing treatment guidelines, considered by other Pain Management Practitioners as being common knowledge and practice, at the time of the intervention. For Medico-Legal purposes, it is also important to point out that variation in procedural techniques and pharmacological choices are the acceptable norm. The indications, contraindications, technique, and results of the above procedure should only be interpreted and judged by a Board-Certified Interventional Pain Specialist with extensive familiarity and expertise in the same exact procedure and technique.

## 2024-04-18 ENCOUNTER — Telehealth: Payer: Self-pay | Admitting: *Deleted

## 2024-04-18 NOTE — Telephone Encounter (Signed)
 No problems post procedure.

## 2024-04-21 ENCOUNTER — Telehealth: Payer: Self-pay

## 2024-04-21 NOTE — Telephone Encounter (Signed)
 The patient called in wanting to know if you can send her a fentanyl  patch or something. She is in so much pain and she needs help bad.

## 2024-04-21 NOTE — Telephone Encounter (Signed)
 Spoke with Dr. Naveira. He said any time patient calls with a question like this, please schedule an appt. Thanks

## 2024-04-22 ENCOUNTER — Telehealth: Payer: Self-pay | Admitting: Internal Medicine

## 2024-04-22 ENCOUNTER — Ambulatory Visit: Admitting: Pain Medicine

## 2024-04-22 NOTE — Telephone Encounter (Signed)
 Copied from CRM [id number]. Topic: General - Other >> Apr 22, 2024 10:23 AM [employer] wrote: Reason for CRM: Sonya calling from [facility] home [facility] to check on the status of the Home Health Certification And Plan of Care which according to her, was faxed over on 04/04/24 and was received on 04/07/24.   Call back number is [phone]

## 2024-04-22 NOTE — Progress Notes (Unsigned)
 PROVIDER NOTE: Interpretation of information contained herein should be left to medically-trained personnel. Specific patient instructions are provided elsewhere under "Patient Instructions" section of medical record. This document was created in part using AI and STT-dictation technology, any transcriptional errors that may result from this process are unintentional.  Patient: Wanda Martinez  Service: E/M   PCP: Dellar Fenton, MD  DOB: 1935/11/13  DOS: 04/23/2024  Provider: Candi Chafe, MD  MRN: [id number]  Delivery: Face-to-face  Specialty: Interventional Pain Management  Type: Established Patient  Setting: Ambulatory outpatient facility  Specialty designation: 09  Referring Prov.: Dellar Fenton, MD  Location: Outpatient office facility       HPI  Wanda Martinez, a 89 y.o. year old female, is here today because of her Chronic pain syndrome [G89.4]. Wanda Martinez primary complain today is No chief complaint on file.  Pertinent problems: Wanda Martinez has Headache; Chronic low back pain (1ry area of Pain) (Bilateral) (R>L) w/o sciatica; Lumbar spondylosis; Chronic hip pain (Bilateral); Chronic neck pain; Cervical spondylosis; Chronic cervical radicular pain (Right); Diffuse myofascial pain syndrome; Neurogenic pain; Chronic upper back pain (Right); Myofascial pain syndrome (Right) (cervicothoracic); Lumbar facet syndrome (Bilateral) (R>L); History of breast cancer; Cervical facet hypertrophy; Cervical facet syndrome (HCC); Chronic shoulder pain (Right); Chronic pain syndrome; Chronic sacroiliac joint pain (Left); Chronic sacroiliac joint pain (Right); 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, L4-5, L5-S1) (Right); Lumbar spinal stenosis (with neurogenic claudication) (L3-4); Chronic lower extremity pain (Right); Chronic lumbar radicular pain (Right); Trochanteric bursitis of hip (Bilateral); Spondylosis without myelopathy or radiculopathy, lumbosacral region; Trigger point with back pain  (Right); DDD (degenerative disc disease), lumbosacral; Chronic upper extremity pain (Right); Chronic thoracic back pain (Bilateral) (L>R); Trigger point of thoracic region (Bilateral) (L>R); Other specified dorsopathies, sacral and sacrococcygeal region; Sacroiliac joint dysfunction (Right); Bilateral sacroiliitis (HCC); Somatic dysfunction of sacroiliac joint (Right); Chronic musculoskeletal pain; Facial pain; Chronic hip pain (Right); Osteoarthritis of hip (Right); Left ear pain; 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HCC); Abnormal MRI, lumbar spine (03/14/2024); Osteoarthritis involving multiple joints; Other spondylosis, sacral and sacrococcygeal region; Lumbar facet hypertrophy (Multilevel) (Bilateral); Chronic knee pain (Right); Osteoarthritis of knee (Right); Trigger point of shoulder region (Right); Osteoarthritis of AC (acromioclavicular) joint (Right); Osteoarthritis of shoulder (Right); Unspecified injury of muscle(s) and tendon(s) of the rotator cuff of shoulder, sequela (Right); Cervicalgia; DDD (degenerative disc disease), lumbar; Lumbar radicular pain (Bilateral); Lumbar foraminal stenosis (Right: L3-4, L4-5, L5-S1) (Left: L2-3); Abnormal MRI, hip (Right) (05/04/2022); Abnormal MRI, knee (Right) (11/29/2023); Tricompartment osteoarthritis of knee (Right); Chronic low back pain (Right) w/o sciatica; Hoffa's fat pad disease (HCC) (Right); Hoffa's pad syndrome (HCC) (Right); Chronic low back pain (Right) w/ sciatica (Right); Lower extremity weakness (Right); Chronic radicular pain of lower back; Chronic sciatica (Right); Lumbosacral radiculopathy at L5 (Right); Low back pain of over 3 months duration; Personal history of breast cancer; Pes anserine bursitis (Right); Infrapatellar bursitis of knee (Right); Traumatic ecchymosis of right lower leg; Lumbar facet joint pain; Medial knee pain (Right); Chronic patellofemoral pain of knee (Right); Patellofemoral joint pain (Right); Knee hemarthrosis (Right); Pes  anserinus bursitis of right knee; Abnormal MRI, shoulder (Right) (06/21/2023); Wound, open, leg; Chronic hip pain (Left); Osteoarthritis of hips (Bilateral) (R>L); Intractable back pain, acute on chronic; Low back pain; Trochanteric bursitis of left hip; Acute pain of left shoulder; Sprain of left shoulder, initial encounter; Chronic shoulder pain (Left); Osteoarthritis of AC (acromioclavicular) joint (Left); Arthralgia of acromioclavicular joint (Left); Osteoarthritis of shoulder (Left); and Trigger point of shoulder region (Left) on  their pertinent problem list. Pain Assessment: Severity of   is reported as a  /10. Location:    / . Onset:  . Quality:  . Timing:  . Modifying factor(s):  Wanda Martinez Vitals:  vitals were not taken for this visit.  BMI: Estimated body mass index is 27.46 kg/m as calculated from the following:   Height as of 04/17/24: 5\' 3"  (1.6 m).   Weight as of 04/17/24: 155 lb (70.3 kg). Last encounter: 04/01/2024. Last procedure: 04/17/2024.  Reason for encounter: evaluation of worsening, or previously known (established) problem. ***  Discussed the use of AI scribe software for clinical note transcription with the patient, who gave verbal consent to proceed.  History of Present Illness           Pharmacotherapy Assessment  Analgesic: Oxycodone  IR 5 mg, 1 tab PO q 8 hrs (15 mg/day of oxycodone ) MME/day: 22.5 mg/day.    Monitoring: [place] PMP: PDMP reviewed during this encounter.       Pharmacotherapy: No side-effects or adverse reactions reported. Compliance: No problems identified. Effectiveness: Clinically acceptable.  No notes on file  No results found for: "CBDTHCR" No results found for: "D8THCCBX" No results found for: "D9THCCBX"  UDS:  Summary  Date Value Ref Range Status  07/31/2023 Note  Final    Comment:    ==================================================================== [employer] [facility]  (MW) ==================================================================== Test                             Result       Flag       Units  Drug Present and Declared for Prescription Verification   Oxycodone                       839          EXPECTED   ng/mg creat   Noroxycodone                   3412         EXPECTED   ng/mg creat    Sources of oxycodone  include scheduled prescription medications.    Noroxycodone is an expected metabolite of oxycodone .  Drug Present not Declared for Prescription Verification   Hydrocodone                     340          UNEXPECTED ng/mg creat   Dihydrocodeine                 32           UNEXPECTED ng/mg creat   Norhydrocodone                 403          UNEXPECTED ng/mg creat    Sources of hydrocodone  include scheduled prescription medications.    Dihydrocodeine and norhydrocodone are expected metabolites of    hydrocodone . Dihydrocodeine is also available as a scheduled    prescription medication.  ==================================================================== Test                      Result    Flag   Units      Ref Range   Creatinine              208              mg/dL      >=  20 ==================================================================== Declared Medications:  The flagging and interpretation on this report are based on the  following declared medications.  Unexpected results may arise from  inaccuracies in the declared medications.   **Note: The testing scope of this panel includes these medications:   Oxycodone  (Roxicodone )   **Note: The testing scope of this panel does not include the  following reported medications:   Amlodipine  (Norvasc )  Aspirin  Calcium   Chondroitin  Ciprofloxacin  (Cipro )  Citalopram  (Celexa )  Cyclobenzaprine  (Flexeril )  Donepezil  (Aricept )  Erythromycin  Gabapentin  (Neurontin )  Glucosamine  Hydrocortisone  (Anusol -HC)  Letrozole  (Femara )  Losartan  (Cozaar )  Lubiprostone  (Amitiza )  Magnesium   (Mag-Ox)  Melatonin  Metoprolol  (Toprol )  Mirabegron  (Myrbetriq )  Multivitamin  Naloxone  (Narcan )  Nitrofurantoin  (Macrobid )  Omega-3 Fatty Acids  Pantoprazole  (Protonix )  Rosuvastatin  (Crestor ) ==================================================================== For clinical consultation, please call [phone]. ====================================================================       ROS  Constitutional: Denies any fever or chills Gastrointestinal: No reported hemesis, hematochezia, vomiting, or acute GI distress Musculoskeletal: Denies any acute onset joint swelling, redness, loss of ROM, or weakness Neurological: No reported episodes of acute onset apraxia, aphasia, dysarthria, agnosia, amnesia, paralysis, loss of coordination, or loss of consciousness  Medication Review  Glucosamine-Chondroitin, Omega-3, acetaminophen , amLODipine , aspirin EC, calcium  carbonate, citalopram , donepezil , gabapentin , hydrochlorothiazide , losartan , magnesium  oxide, melatonin, meloxicam , methocarbamol , metoprolol  succinate, mirabegron  ER, multivitamin, naloxone , oxyCODONE , pantoprazole , polyethylene glycol, predniSONE , and rosuvastatin   History Review  Allergy: Wanda Martinez is allergic to sulfa antibiotics and vesicare [solifenacin]. Drug: Wanda Martinez  reports current drug use. Alcohol:  reports no history of alcohol use. Tobacco:  reports that she has never smoked. She has never been exposed to tobacco smoke. She has never used smokeless tobacco. Social: Wanda Martinez  reports that she has never smoked. She has never been exposed to tobacco smoke. She has never used smokeless tobacco. She reports current drug use. She reports that she does not drink alcohol. Medical:  has a past medical history of Acute postoperative pain (10/25/2017), Anemia, Arm pain (07/26/2015), Arthritis, Arthritis, degenerative (03/26/2014), Back pain (11/01/2013), Bladder infection (06/2018), Breast cancer (HCC), Breast cancer (HCC),  CHEST PAIN (04/29/2010), Chronic cystitis, Cystocele (02/22/2013), Cystocele, midline (08/19/2013), Degeneration of intervertebral disc of lumbosacral region (03/26/2014), DYSPNEA (04/29/2010), Enthesopathy of hip (03/26/2014), GERD (gastroesophageal reflux disease), Hiatal hernia, HTN (hypertension), Hypokalemia (06/2018), Hyponatremia (06/2018), LBP (low back pain) (03/26/2014), Neck pain (11/01/2013), Parkinson disease (HCC), Pneumonia (06/2018), Sinusitis (02/07/2015), Skin lesions (07/12/2014), and Urinary incontinence. Surgical: Wanda Martinez  has a past surgical history that includes Tonsillectomy and adenoidectomy (1979); Vesicovaginal fistula closure w/ TAH (1983); Breast surgery (1986); Breast enhancement surgery (1987); Breast implant removal; Breast implant removal (Right, 08/29/2012); Mastectomy (08/2012); Abdominal hysterectomy; Colonoscopy with propofol  (N/A, 09/13/2016); Breast biopsy (2013); and Blepharoplasty. Family: family history includes Colon polyps in her father; Diabetes in her father; Parkinson's disease in her mother; Stroke in her father and mother.  Laboratory Chemistry Profile   Renal Lab Results  Component Value Date   BUN 35 (H) 04/03/2024   CREATININE 0.95 04/03/2024   BCR 37 (H) 04/03/2024   GFR 46.06 (L) 02/18/2024   GFRAA >60 07/27/2020   GFRNONAA >60 03/25/2024    Hepatic Lab Results  Component Value Date   AST 19 02/18/2024   ALT 21 02/18/2024   ALBUMIN 4.0 02/18/2024   ALKPHOS 25 (L) 02/18/2024    Electrolytes Lab Results  Component Value Date   NA 131 (L) 04/03/2024   K 4.5 04/03/2024   CL 96 (L) 04/03/2024   CALCIUM  9.1 04/03/2024  MG 2.2 07/17/2018    Bone Lab Results  Component Value Date   VD25OH 66.95 09/26/2022   25OHVITD1 42 07/17/2018   25OHVITD2 <1.0 07/17/2018   25OHVITD3 42 07/17/2018    Inflammation (CRP: Acute Phase) (ESR: Chronic Phase) Lab Results  Component Value Date   CRP 3 07/17/2018   ESRSEDRATE 27 07/17/2018         Note:  Above Lab results reviewed.  Recent Imaging Review  DG PAIN CLINIC C-ARM 1-60 MIN NO REPORT Fluoro was used, but no Radiologist interpretation will be provided.  Please refer to "NOTES" tab for provider progress note. Note: Reviewed        Physical Exam  General appearance: Well nourished, well developed, and well hydrated. In no apparent acute distress Mental status: Alert, oriented x 3 (person, place, & time)       Respiratory: No evidence of acute respiratory distress Eyes: PERLA Vitals: LMP 12/18/1981  BMI: Estimated body mass index is 27.46 kg/m as calculated from the following:   Height as of 04/17/24: 5\' 3"  (1.6 m).   Weight as of 04/17/24: 155 lb (70.3 kg). Ideal: Ideal body weight: 52.4 kg (115 lb 8.3 oz) Adjusted ideal body weight: 59.6 kg (131 lb 5 oz)  Assessment   Diagnosis Status  1. Chronic pain syndrome   2. Chronic low back pain (1ry area of Pain) (Bilateral) (R>L) w/o sciatica   3. Abnormal MRI, shoulder (Right) (06/21/2023)   4. Abnormal MRI, lumbar spine (03/14/2024)   5. Abnormal MRI, knee (Right) (11/29/2023)   6. Abnormal MRI, hip (Right) (05/04/2022)   7. Chronic use of opiate for therapeutic purpose   8. Long term current use of opiate analgesic   9. Memory impairment of gradual onset    Controlled Controlled Controlled   Updated Problems: No problems updated.  Plan of Care  Problem-specific:  Assessment and Plan            Wanda Martinez has a current medication list which includes the following long-term medication(s): amlodipine , calcium  carbonate, citalopram , citalopram , donepezil , gabapentin , hydrochlorothiazide , losartan , magnesium  oxide, metoprolol  succinate, mirabegron  er, oxycodone , pantoprazole , and rosuvastatin .  Pharmacotherapy (Medications Ordered): No orders of the defined types were placed in this encounter.  Orders:  No orders of the defined types were placed in this encounter.  Follow-up plan:   No follow-ups on  file.     Interventional Therapies  Risk Factors  Considerations  Medical Comorbidities:  Parkinson's disease  Hx right breast cancer  OIC  SOB  CKD Stage 3b  GERD  depression  osteopenia/osteoporosis  PSVT  Memory impairment      Planned  Pending:      Under consideration:  Therapeutic right suprascapular nerve RFA #1  Possible intrathecal pump trial and permanent implant    Completed:   Diagnostic left suprascapular NB x1 (04/17/2024) (10-0/10)  Diagnostic left infraspinatus TPI/MNB x1 (04/01/2024) (0/0/0/0)  Therapeutic bilateral lumbar facet Blk x15 (03/20/2024)  Therapeutic bilateral lumbar facet RFA x R4L2  Diagnostic/therapeutic right suprascapular NB x2  Therapeutic bilateral trapezius/thoracic TPI x R6L1  Therapeutic right lumbar TPI x1  Therapeutic bilateral SI inj. x R3L2  Therapeutic right SI RFA x4  Therapeutic right knee inj. (steroid x5) (Zilretta  x3) (Monovisc x1) (pes anserine x2) (12/06/2023)  Therapeutic TFESI (bilateral: L5, L2 x1) (right: L5 x4, L4 x1, L3 x1)  Therapeutic LESI (midline: L2-3 x1) (right: L3-4 x2, L4-5 x2, L5-S1 x2)    Therapeutic  Palliative (PRN) options:   Do  not schedule any procedures without a proper preprocedure evaluation.   Completed by other providers:   Therapeutic right knee (ORTHOVISC) inj. x3 (05/18/2023, 05/23/2023, 05/30/2023) by Mickeal Aland, MD [facility]) Therapeutic left knee (Kenalog ) inj. x1 (08/25/2020) by Candise Chambers, MD [facility])   Pharmacotherapy  Nonopioids transfer 12/21/2020: Gabapentin       Recent Visits Date Type Provider Dept  04/17/24 Procedure visit Renaldo Caroli, MD [facility]  04/01/24 Office Visit Renaldo Caroli, MD [facility]  03/26/24 Office Visit Renaldo Caroli, MD [facility]  03/20/24 Procedure visit Renaldo Caroli, MD [facility]  02/11/24 Office Visit Renaldo Caroli, MD [facility]  02/05/24  Procedure visit Renaldo Caroli, MD [facility]  01/29/24 Office Visit Renaldo Caroli, MD [facility]  Showing recent visits within past 90 days and meeting all other requirements Future Appointments Date Type Provider Dept  04/23/24 Appointment Renaldo Caroli, MD [facility]  05/07/24 Appointment Renaldo Caroli, MD [facility]  05/19/24 Appointment Renaldo Caroli, MD [facility]  Showing future appointments within next 90 days and meeting all other requirements  I discussed the assessment and treatment plan with the patient. The patient was provided an opportunity to ask questions and all were answered. The patient agreed with the plan and demonstrated an understanding of the instructions.  Patient advised to call back or seek an in-person evaluation if the symptoms or condition worsens.  Duration of encounter: *** minutes.  Total time on encounter, as per AMA guidelines included both the face-to-face and non-face-to-face time personally spent by the physician and/or other qualified health care professional(s) on the day of the encounter (includes time in activities that require the physician or other qualified health care professional and does not include time in activities normally performed by clinical staff). Physician's time may include the following activities when performed: Preparing to see the patient (e.g., pre-charting review of records, searching for previously ordered imaging, lab work, and nerve conduction tests) Review of prior analgesic pharmacotherapies. Reviewing PMP Interpreting ordered tests (e.g., lab work, imaging, nerve conduction tests) Performing post-procedure evaluations, including interpretation of diagnostic procedures Obtaining and/or reviewing separately obtained history Performing a medically appropriate examination and/or evaluation Counseling and educating the  patient/family/caregiver Ordering medications, tests, or procedures Referring and communicating with other health care professionals (when not separately reported) Documenting clinical information in the electronic or other health record Independently interpreting results (not separately reported) and communicating results to the patient/ family/caregiver Care coordination (not separately reported)  Note by: Candi Chafe, MD (TTS and AI technology used. I apologize for any typographical errors that were not detected and corrected.) Date: 04/23/2024; Time: 7:35 AM

## 2024-04-23 ENCOUNTER — Encounter: Payer: Self-pay | Admitting: Pain Medicine

## 2024-04-23 ENCOUNTER — Telehealth: Payer: Self-pay

## 2024-04-23 ENCOUNTER — Telehealth: Payer: Self-pay | Admitting: *Deleted

## 2024-04-23 ENCOUNTER — Ambulatory Visit: Attending: Pain Medicine | Admitting: Pain Medicine

## 2024-04-23 VITALS — BP 94/59 | HR 66 | Temp 97.4°F | Resp 16 | Ht 64.0 in | Wt 155.0 lb

## 2024-04-23 DIAGNOSIS — K5903 Drug induced constipation: Secondary | ICD-10-CM | POA: Insufficient documentation

## 2024-04-23 DIAGNOSIS — M1711 Unilateral primary osteoarthritis, right knee: Secondary | ICD-10-CM

## 2024-04-23 DIAGNOSIS — I959 Hypotension, unspecified: Secondary | ICD-10-CM

## 2024-04-23 DIAGNOSIS — M810 Age-related osteoporosis without current pathological fracture: Secondary | ICD-10-CM | POA: Diagnosis not present

## 2024-04-23 DIAGNOSIS — M47816 Spondylosis without myelopathy or radiculopathy, lumbar region: Secondary | ICD-10-CM

## 2024-04-23 DIAGNOSIS — G8929 Other chronic pain: Secondary | ICD-10-CM

## 2024-04-23 DIAGNOSIS — R531 Weakness: Secondary | ICD-10-CM | POA: Diagnosis not present

## 2024-04-23 DIAGNOSIS — R937 Abnormal findings on diagnostic imaging of other parts of musculoskeletal system: Secondary | ICD-10-CM

## 2024-04-23 DIAGNOSIS — Z79899 Other long term (current) drug therapy: Secondary | ICD-10-CM | POA: Diagnosis not present

## 2024-04-23 DIAGNOSIS — R413 Other amnesia: Secondary | ICD-10-CM

## 2024-04-23 DIAGNOSIS — M7918 Myalgia, other site: Secondary | ICD-10-CM | POA: Diagnosis not present

## 2024-04-23 DIAGNOSIS — M549 Dorsalgia, unspecified: Secondary | ICD-10-CM | POA: Insufficient documentation

## 2024-04-23 DIAGNOSIS — M8580 Other specified disorders of bone density and structure, unspecified site: Secondary | ICD-10-CM

## 2024-04-23 DIAGNOSIS — M899 Disorder of bone, unspecified: Secondary | ICD-10-CM

## 2024-04-23 DIAGNOSIS — R5383 Other fatigue: Secondary | ICD-10-CM

## 2024-04-23 DIAGNOSIS — R935 Abnormal findings on diagnostic imaging of other abdominal regions, including retroperitoneum: Secondary | ICD-10-CM

## 2024-04-23 DIAGNOSIS — M5459 Other low back pain: Secondary | ICD-10-CM | POA: Diagnosis present

## 2024-04-23 DIAGNOSIS — E871 Hypo-osmolality and hyponatremia: Secondary | ICD-10-CM

## 2024-04-23 DIAGNOSIS — S46001S Unspecified injury of muscle(s) and tendon(s) of the rotator cuff of right shoulder, sequela: Secondary | ICD-10-CM | POA: Diagnosis present

## 2024-04-23 DIAGNOSIS — M25511 Pain in right shoulder: Secondary | ICD-10-CM | POA: Insufficient documentation

## 2024-04-23 DIAGNOSIS — Z09 Encounter for follow-up examination after completed treatment for conditions other than malignant neoplasm: Secondary | ICD-10-CM | POA: Diagnosis present

## 2024-04-23 DIAGNOSIS — E2740 Unspecified adrenocortical insufficiency: Secondary | ICD-10-CM | POA: Diagnosis not present

## 2024-04-23 DIAGNOSIS — G894 Chronic pain syndrome: Secondary | ICD-10-CM

## 2024-04-23 DIAGNOSIS — M25512 Pain in left shoulder: Secondary | ICD-10-CM | POA: Diagnosis not present

## 2024-04-23 DIAGNOSIS — Z79891 Long term (current) use of opiate analgesic: Secondary | ICD-10-CM

## 2024-04-23 DIAGNOSIS — M545 Low back pain, unspecified: Secondary | ICD-10-CM | POA: Diagnosis not present

## 2024-04-23 DIAGNOSIS — Z789 Other specified health status: Secondary | ICD-10-CM | POA: Diagnosis not present

## 2024-04-23 DIAGNOSIS — M25561 Pain in right knee: Secondary | ICD-10-CM | POA: Diagnosis not present

## 2024-04-23 DIAGNOSIS — M2550 Pain in unspecified joint: Secondary | ICD-10-CM | POA: Diagnosis not present

## 2024-04-23 DIAGNOSIS — R936 Abnormal findings on diagnostic imaging of limbs: Secondary | ICD-10-CM

## 2024-04-23 DIAGNOSIS — T402X5A Adverse effect of other opioids, initial encounter: Secondary | ICD-10-CM | POA: Diagnosis not present

## 2024-04-23 DIAGNOSIS — T380X5A Adverse effect of glucocorticoids and synthetic analogues, initial encounter: Secondary | ICD-10-CM | POA: Insufficient documentation

## 2024-04-23 MED ORDER — OXYCODONE HCL 5 MG PO TABS: 5.0000 mg | ORAL_TABLET | Freq: Four times a day (QID) | ORAL | 0 refills | Status: AC | PRN

## 2024-04-23 NOTE — Progress Notes (Signed)
 Nursing Pain Medication Assessment:  Safety precautions to be maintained throughout the outpatient stay will include: orient to surroundings, keep bed in low position, maintain call bell within reach at all times, provide assistance with transfer out of bed and ambulation.  Medication Inspection Compliance: Pill count conducted under aseptic conditions, in front of the patient. Neither the pills nor the bottle was removed from the patient's sight at any time. Once count was completed pills were immediately returned to the patient in their original bottle.  Medication: Oxycodone  IR Pill/Patch Count: 86/120 Pill/Patch Appearance: Markings consistent with prescribed medication Bottle Appearance: Standard pharmacy container. Clearly labeled. Filled Date: 04 / 26 / 2025 Last Medication intake:  Today

## 2024-04-23 NOTE — Telephone Encounter (Signed)
 Signed and placed in box. Please attach med list.

## 2024-04-23 NOTE — Telephone Encounter (Signed)
 Order was scanned into chart instead of sent to e-fax to sign. I have printed the order and placed out for your signature.

## 2024-04-23 NOTE — Telephone Encounter (Signed)
 Cancelled all unfilled prescriptions for Oxycodone .

## 2024-04-24 ENCOUNTER — Telehealth: Payer: Self-pay

## 2024-04-24 ENCOUNTER — Other Ambulatory Visit: Payer: Self-pay

## 2024-04-24 ENCOUNTER — Emergency Department
Admission: EM | Admit: 2024-04-24 | Discharge: 2024-04-24 | Disposition: A | Discharge status: Home / Self Care | Attending: Emergency Medicine | Admitting: Emergency Medicine

## 2024-04-24 ENCOUNTER — Emergency Department

## 2024-04-24 DIAGNOSIS — R944 Abnormal results of kidney function studies: Secondary | ICD-10-CM | POA: Insufficient documentation

## 2024-04-24 DIAGNOSIS — K5732 Diverticulitis of large intestine without perforation or abscess without bleeding: Secondary | ICD-10-CM | POA: Diagnosis not present

## 2024-04-24 DIAGNOSIS — K5792 Diverticulitis of intestine, part unspecified, without perforation or abscess without bleeding: Secondary | ICD-10-CM | POA: Diagnosis not present

## 2024-04-24 DIAGNOSIS — R531 Weakness: Secondary | ICD-10-CM | POA: Diagnosis not present

## 2024-04-24 DIAGNOSIS — K5730 Diverticulosis of large intestine without perforation or abscess without bleeding: Secondary | ICD-10-CM | POA: Diagnosis not present

## 2024-04-24 DIAGNOSIS — R1031 Right lower quadrant pain: Secondary | ICD-10-CM | POA: Diagnosis not present

## 2024-04-24 DIAGNOSIS — G8929 Other chronic pain: Secondary | ICD-10-CM

## 2024-04-24 DIAGNOSIS — M25512 Pain in left shoulder: Secondary | ICD-10-CM

## 2024-04-24 LAB — CBC
HCT: 35.4 % — ABNORMAL LOW (ref 36.0–46.0)
Hemoglobin: 11.7 g/dL — ABNORMAL LOW (ref 12.0–15.0)
MCH: 31 pg (ref 26.0–34.0)
MCHC: 33.1 g/dL (ref 30.0–36.0)
MCV: 93.7 fL (ref 80.0–100.0)
Platelets: 209 10*3/uL (ref 150–400)
RBC: 3.78 MIL/uL — ABNORMAL LOW (ref 3.87–5.11)
RDW: 13.8 % (ref 11.5–15.5)
WBC: 11.4 10*3/uL — ABNORMAL HIGH (ref 4.0–10.5)
nRBC: 0 % (ref 0.0–0.2)

## 2024-04-24 LAB — URINALYSIS, ROUTINE W REFLEX MICROSCOPIC
Bilirubin Urine: NEGATIVE
Glucose, UA: NEGATIVE mg/dL
Hgb urine dipstick: NEGATIVE
Ketones, ur: NEGATIVE mg/dL
Leukocytes,Ua: NEGATIVE
Nitrite: NEGATIVE
Protein, ur: NEGATIVE mg/dL
Specific Gravity, Urine: 1.021 (ref 1.005–1.030)
pH: 5 (ref 5.0–8.0)

## 2024-04-24 LAB — COMPREHENSIVE METABOLIC PANEL WITH GFR
ALT: 20 U/L (ref 0–44)
AST: 22 U/L (ref 15–41)
Albumin: 3.1 g/dL — ABNORMAL LOW (ref 3.5–5.0)
Alkaline Phosphatase: 26 U/L — ABNORMAL LOW (ref 38–126)
Anion gap: 11 (ref 5–15)
BUN: 43 mg/dL — ABNORMAL HIGH (ref 8–23)
CO2: 23 mmol/L (ref 22–32)
Calcium: 8.8 mg/dL — ABNORMAL LOW (ref 8.9–10.3)
Chloride: 98 mmol/L (ref 98–111)
Creatinine, Ser: 0.96 mg/dL (ref 0.44–1.00)
GFR, Estimated: 58 mL/min — ABNORMAL LOW (ref >60)
Glucose, Bld: 103 mg/dL — ABNORMAL HIGH (ref 70–99)
Potassium: 3.7 mmol/L (ref 3.5–5.1)
Sodium: 132 mmol/L — ABNORMAL LOW (ref 135–145)
Total Bilirubin: 0.5 mg/dL (ref 0.0–1.2)
Total Protein: 6.4 g/dL — ABNORMAL LOW (ref 6.5–8.1)

## 2024-04-24 MED ORDER — AMOXICILLIN-POT CLAVULANATE 875-125 MG PO TABS: 1.0000 | ORAL_TABLET | Freq: Two times a day (BID) | ORAL | 0 refills | Status: AC

## 2024-04-24 MED ORDER — AMOXICILLIN-POT CLAVULANATE 875-125 MG PO TABS
1.0000 | ORAL_TABLET | Freq: Once | ORAL | Status: AC
  Administered 2024-04-24: 1 via ORAL
  Filled 2024-04-24: qty 1

## 2024-04-24 MED ORDER — IOHEXOL 300 MG/ML  SOLN
100.0000 mL | Freq: Once | INTRAMUSCULAR | Status: AC | PRN
  Administered 2024-04-24: 100 mL via INTRAVENOUS

## 2024-04-24 MED ORDER — SODIUM CHLORIDE 0.9 % IV BOLUS
1000.0000 mL | Freq: Once | INTRAVENOUS | Status: AC
  Administered 2024-04-24: 1000 mL via INTRAVENOUS

## 2024-04-24 NOTE — ED Notes (Signed)
 EDP at bedside

## 2024-04-24 NOTE — Telephone Encounter (Signed)
 Copied from CRM [id number]. Topic: Clinical - Medical Advice >> Apr 24, 2024  3:19 PM Allyne Areola wrote: Reason for CRM: Wanda Martinez is calling because Patient was sent to the ED by her pain management doctor. She wanted to let Dr.Scott know.They also wanted to follow up on the palliative care referral that should have been placed by Dr.Scott as well as requesting a hospital bed for their home to be comfortable for the patient.

## 2024-04-24 NOTE — ED Provider Notes (Signed)
 [facility] Provider Note    Event Date/Time   First MD Initiated Contact with Patient 04/24/24 2125     (approximate)   History   Weakness   HPI Briyanna Anji Terman is a 89 y.o. female with history of chronic pain syndrome presenting today for weakness.  Patient states over the past several days she has had increased globalized weakness throughout.  Still able to ambulate.  No specific one-sided weakness or numbness anywhere.  No speech changes or facial changes.  Denies any lightheadedness or dizziness.  Has some mild right lower quadrant pain and some irritation with urination.  Otherwise denies chest pain, shortness of breath, nausea, vomiting, diarrhea, constipation.     Physical Exam   Triage Vital Signs: ED Triage Vitals  Encounter Vitals Group     BP 04/24/24 1530 92/68     Systolic BP Percentile --      Diastolic BP Percentile --      Pulse Rate 04/24/24 1530 92     Resp 04/24/24 1530 20     Temp 04/24/24 1530 98.4 F (36.9 C)     Temp Source 04/24/24 1530 Oral     SpO2 04/24/24 1530 100 %     Weight 04/24/24 1531 157 lb (71.2 kg)     Height 04/24/24 1531 5\' 3"  (1.6 m)     Head Circumference --      Peak Flow --      Pain Score 04/24/24 1531 10     Pain Loc --      Pain Education --      Exclude from Growth Chart --     Most recent vital signs: Vitals:   04/24/24 2218 04/24/24 2219  BP: (!) 86/55   Pulse: 65 65  Resp: 18   Temp: 97.9 F (36.6 C)   SpO2: 98% 98%   Physical Exam: I have reviewed the vital signs and nursing notes. General: Awake, alert, no acute distress.  Nontoxic appearing. Head:  Atraumatic, normocephalic.   ENT:  EOM intact, PERRL. Oral mucosa is pink and moist with no lesions. Neck: Neck is supple with full range of motion, No meningeal signs. Cardiovascular:  RRR, No murmurs. Peripheral pulses palpable and equal bilaterally. Respiratory:  Symmetrical chest wall expansion.  No rhonchi, rales, or wheezes.   Good air movement throughout.  No use of accessory muscles.   Musculoskeletal:  No cyanosis or edema. Moving extremities with full ROM Abdomen:  Soft, tenderness to palpation in the right lower quadrant, nondistended. Neuro:  GCS 15, moving all four extremities, interacting appropriately. Speech clear. Psych:  Calm, appropriate.   Skin:  Warm, dry, no rash.    ED Results / Procedures / Treatments   Labs (all labs ordered are listed, but only abnormal results are displayed) Labs Reviewed  COMPREHENSIVE METABOLIC PANEL WITH GFR - Abnormal; Notable for the following components:      Result Value   Sodium 132 (*)    Glucose, Bld 103 (*)    BUN 43 (*)    Calcium  8.8 (*)    Total Protein 6.4 (*)    Albumin 3.1 (*)    Alkaline Phosphatase 26 (*)    GFR, Estimated 58 (*)    All other components within normal limits  CBC - Abnormal; Notable for the following components:   WBC 11.4 (*)    RBC 3.78 (*)    Hemoglobin 11.7 (*)    HCT 35.4 (*)    All  other components within normal limits  URINALYSIS, ROUTINE W REFLEX MICROSCOPIC - Abnormal; Notable for the following components:   Color, Urine YELLOW (*)    APPearance HAZY (*)    All other components within normal limits     EKG My EKG interpretation: Rate of 90, normal sinus rhythm, normal axis, normal intervals.  No acute ST elevations or depressions   RADIOLOGY Independently interpreted CT abdomen/pelvis with concern for diverticulitis   PROCEDURES:  Critical Care performed: No  Procedures   MEDICATIONS ORDERED IN ED: Medications  amoxicillin -clavulanate (AUGMENTIN ) 875-125 MG per tablet 1 tablet (has no administration in time range)  sodium chloride  0.9 % bolus 1,000 mL (1,000 mLs Intravenous New Bag/Given 04/24/24 2211)  iohexol  (OMNIPAQUE ) 300 MG/ML solution 100 mL (100 mLs Intravenous Contrast Given 04/24/24 2222)     IMPRESSION / MDM / ASSESSMENT AND PLAN / ED COURSE  I reviewed the triage vital signs and the nursing  notes.                              Differential diagnosis includes, but is not limited to, appendicitis, colitis, enteritis, acute cystitis, dehydration, electrolyte abnormality  Patient's presentation is most consistent with acute complicated illness / injury requiring diagnostic workup.  Patient is an 89 year old female presenting today for generalized weakness.  Only obvious finding on physical exam is some slight tenderness to palpation of the right lower quadrant.  Otherwise nonfocal neurological exam with no deficits.  Vital signs show slight hypotension but otherwise no tachycardia or respiratory issues.  Laboratory workup shows slight elevation in her BUN which could indicate some dehydration as her urine is also pretty dark yellow-colored.  No evidence of a UTI.  CBC largely reassuring.  She stated having darker stools and was checked for Hemoccult but this was negative.  Hemoglobin otherwise stable at her baseline.  No concern for GI bleed.  Otherwise electrolytes largely reassuring.  Given 1 L of fluids and CT abdomen/pelvis ordered for her abdominal pain.  CT abdomen/pelvis appears to show concern for diverticulitis which would explain her dehydration and lower abdominal pain.  Vital signs normalized after 1 L of fluids.  Patient otherwise stable and safe for discharge.  Will start her on Augmentin  and have her follow-up with PCP.  Given strict return precautions.  The patient is on the cardiac monitor to evaluate for evidence of arrhythmia and/or significant heart rate changes. Clinical Course as of 04/24/24 2309  Thu Apr 24, 2024  2231 Hemoccult negative stool [DW]    Clinical Course User Index [DW] Kandee Orion, MD     FINAL CLINICAL IMPRESSION(S) / ED DIAGNOSES   Final diagnoses:  Diverticulitis  Weakness     Rx / DC Orders   ED Discharge Orders          Ordered    amoxicillin -clavulanate (AUGMENTIN ) 875-125 MG tablet  2 times daily        04/24/24 2309              Note:  This document was prepared using Dragon voice recognition software and may include unintentional dictation errors.   Kandee Orion, MD 04/24/24 [phone]

## 2024-04-24 NOTE — Addendum Note (Signed)
 Addended by: Anjelique Makar on: 04/24/2024 04:52 PM   Modules accepted: Orders

## 2024-04-24 NOTE — Discharge Instructions (Addendum)
 I have sent medication to your pharmacy to help treat your diverticulitis which is inflammation lower down in your bowels.  Please make sure you are hydrating well.  Follow-up with your primary care provider or return to the ED for any severe worsening symptoms.

## 2024-04-24 NOTE — Telephone Encounter (Signed)
 Med list attached. Paperwork faxed.

## 2024-04-24 NOTE — ED Notes (Signed)
 Small skin tear on right arm after blood draw. Bleeding eaisly controlled, 2x2 and coban applied to arm.

## 2024-04-24 NOTE — Telephone Encounter (Signed)
 Rx signed for hospital bed. Signed and placed in box.  Per note review, it appears that services were declined/ I can place another order.

## 2024-04-24 NOTE — ED Triage Notes (Signed)
 Patient reports generalized weakness since yesterday; states she has some vaginal irritation when urinating.

## 2024-04-24 NOTE — Telephone Encounter (Signed)
 Pt is currently in the ED. The palliative care referral has been closed because when they were contacted the services were declined.   I have pended the DME order for hospital bed for your approval.

## 2024-04-24 NOTE — Addendum Note (Signed)
 Addended by: Raejean Bullock on: 04/24/2024 05:05 PM   Modules accepted: Orders

## 2024-04-24 NOTE — ED Notes (Signed)
 Patient transported to CT

## 2024-04-28 ENCOUNTER — Ambulatory Visit: Payer: Self-pay | Admitting: Urology

## 2024-04-28 LAB — COMPLIANCE DRUG ANALYSIS, UR

## 2024-04-28 NOTE — Telephone Encounter (Signed)
 Spoke with pt's son and he stated that he would like for a new referral to be placed for palliative care. He would like for them to call him instead of pt so the services does not get declined again. Son is also aware that the DME order for the hospital bed has been placed up front for pick up.

## 2024-04-29 ENCOUNTER — Inpatient Hospital Stay: Payer: Medicare Other

## 2024-04-29 ENCOUNTER — Telehealth: Payer: Self-pay | Admitting: Oncology

## 2024-04-29 NOTE — Telephone Encounter (Signed)
 this lady left message with answering service to cancel lab/inj appt today and call to reschedule. [phone] team notified.

## 2024-04-29 NOTE — Telephone Encounter (Signed)
 Spoke with pt and appts are r/s

## 2024-05-02 LAB — ACTH: ACTH: 17.1 pg/mL (ref 7.2–63.3)

## 2024-05-02 LAB — COMP. METABOLIC PANEL (12)
AST: 24 IU/L (ref 0–40)
Albumin: 4.1 g/dL (ref 3.7–4.7)
Alkaline Phosphatase: 36 IU/L — ABNORMAL LOW (ref 44–121)
BUN/Creatinine Ratio: 38 — ABNORMAL HIGH (ref 12–28)
BUN: 46 mg/dL — ABNORMAL HIGH (ref 8–27)
Bilirubin Total: 0.3 mg/dL (ref 0.0–1.2)
Calcium: 9.7 mg/dL (ref 8.7–10.3)
Chloride: 94 mmol/L — ABNORMAL LOW (ref 96–106)
Creatinine, Ser: 1.2 mg/dL — ABNORMAL HIGH (ref 0.57–1.00)
Globulin, Total: 2.5 g/dL (ref 1.5–4.5)
Glucose: 86 mg/dL (ref 70–99)
Potassium: 4.9 mmol/L (ref 3.5–5.2)
Sodium: 132 mmol/L — ABNORMAL LOW (ref 134–144)
Total Protein: 6.6 g/dL (ref 6.0–8.5)
eGFR: 44 mL/min/{1.73_m2} — ABNORMAL LOW (ref >59)

## 2024-05-02 LAB — VITAMIN B1: Thiamine: 169.7 nmol/L (ref 66.5–200.0)

## 2024-05-02 LAB — 25-HYDROXY VITAMIN D LCMS D2+D3
25-Hydroxy, Vitamin D-3: 60 ng/mL
25-Hydroxy, Vitamin D: 61 ng/mL

## 2024-05-02 LAB — VITAMIN B12: Vitamin B-12: 822 pg/mL (ref 232–1245)

## 2024-05-02 LAB — C-REACTIVE PROTEIN: CRP: 27 mg/L — ABNORMAL HIGH (ref 0–10)

## 2024-05-02 LAB — MAGNESIUM: Magnesium: 2.4 mg/dL — ABNORMAL HIGH (ref 1.6–2.3)

## 2024-05-02 LAB — SEDIMENTATION RATE: Sed Rate: 15 mm/h (ref 0–40)

## 2024-05-02 LAB — PREGNENOLONE: Pregnenolone: <10 ng/dL

## 2024-05-02 NOTE — Telephone Encounter (Signed)
 Copied from CRM [id number]. Topic: Clinical - Prescription Issue >> May 02, 2024 10:49 AM Baldo Levan wrote: Reason for CRM: Patients son Geralyn Knee called in stating he picked up a prescription for a hospital bed the other day and the company told him it is missing a code on the prescription possibly a DX code. Geralyn Knee is also asking about possibly getting a prescription for an electric wheelchair for his mother as well.  Scott's call back number is [phone]

## 2024-05-02 NOTE — Telephone Encounter (Signed)
 He is going to call back if anything further is needed.

## 2024-05-02 NOTE — Telephone Encounter (Signed)
 Patients son is aware that there are diagnosis codes on the DME for the bed. Also, advised that patient would have to have a mobility assessment done in order to qualify for a motorized wheelchair.

## 2024-05-06 NOTE — Progress Notes (Deleted)
 Wanda Martinez

## 2024-05-07 ENCOUNTER — Ambulatory Visit: Payer: Medicare Other | Attending: Pain Medicine | Admitting: Pain Medicine

## 2024-05-07 ENCOUNTER — Encounter: Payer: Self-pay | Admitting: Pain Medicine

## 2024-05-07 VITALS — BP 118/84 | HR 79 | Temp 97.0°F | Resp 18 | Ht 63.0 in | Wt 155.0 lb

## 2024-05-07 DIAGNOSIS — G894 Chronic pain syndrome: Secondary | ICD-10-CM

## 2024-05-07 DIAGNOSIS — G8929 Other chronic pain: Secondary | ICD-10-CM

## 2024-05-07 DIAGNOSIS — Z79899 Other long term (current) drug therapy: Secondary | ICD-10-CM

## 2024-05-07 DIAGNOSIS — M25511 Pain in right shoulder: Secondary | ICD-10-CM | POA: Insufficient documentation

## 2024-05-07 DIAGNOSIS — M549 Dorsalgia, unspecified: Secondary | ICD-10-CM | POA: Insufficient documentation

## 2024-05-07 DIAGNOSIS — M47816 Spondylosis without myelopathy or radiculopathy, lumbar region: Secondary | ICD-10-CM

## 2024-05-07 DIAGNOSIS — M1711 Unilateral primary osteoarthritis, right knee: Secondary | ICD-10-CM | POA: Diagnosis not present

## 2024-05-07 DIAGNOSIS — S46001S Unspecified injury of muscle(s) and tendon(s) of the rotator cuff of right shoulder, sequela: Secondary | ICD-10-CM | POA: Diagnosis not present

## 2024-05-07 DIAGNOSIS — Z79891 Long term (current) use of opiate analgesic: Secondary | ICD-10-CM | POA: Diagnosis not present

## 2024-05-07 DIAGNOSIS — M25561 Pain in right knee: Secondary | ICD-10-CM | POA: Diagnosis not present

## 2024-05-07 MED ORDER — FENTANYL 12 MCG/HR TD PT72: 1.0000 | MEDICATED_PATCH | TRANSDERMAL | 0 refills | Status: AC

## 2024-05-07 NOTE — Progress Notes (Signed)
 PROVIDER NOTE: Interpretation of information contained herein should be left to medically-trained personnel. Specific patient instructions are provided elsewhere under "Patient Instructions" section of medical record. This document was created in part using AI and STT-dictation technology, any transcriptional errors that may result from this process are unintentional.  Patient: Wanda Martinez  Service: E/M   PCP: Wanda Fenton, MD  DOB: [birth year]  DOS: 05/07/2024  Provider: Candi Chafe, MD  MRN: [id number]  Delivery: Face-to-face  Specialty: Interventional Pain Management  Type: Established Patient  Setting: Ambulatory outpatient facility  Specialty designation: 09  Referring Prov.: Wanda Fenton, MD  Location: Outpatient office facility       HPI  [name], a 89 y.o. year old female, is here today because of her Chronic pain syndrome [G89.4]. [name] primary complain today is Shoulder Pain (left), Back Pain, and Knee Pain (right)  Pertinent problems: Wanda Martinez has Headache; Chronic low back pain (1ry area of Pain) (Bilateral) (R>L) w/o sciatica; Lumbar spondylosis; Chronic hip pain (Bilateral); Chronic neck pain; Cervical spondylosis; Chronic cervical radicular pain (Right); Diffuse myofascial pain syndrome; Neurogenic pain; Chronic upper back pain (Right); Myofascial pain syndrome (Right) (cervicothoracic); Lumbar facet syndrome (Bilateral) (R>L); History of breast cancer; Cervical facet hypertrophy; Cervical facet syndrome (HCC); Chronic shoulder pain (Right); Chronic pain syndrome; Chronic sacroiliac joint pain (Left); Chronic sacroiliac joint pain (Right); 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, L4-5, L5-S1) (Right); Lumbar spinal stenosis (with neurogenic claudication) (L3-4); Chronic lower extremity pain (Right); Chronic lumbar radicular pain (Right); Trochanteric bursitis of hip (Bilateral); Spondylosis without myelopathy or radiculopathy, lumbosacral region;  Trigger point with back pain (Right); DDD (degenerative disc disease), lumbosacral; Chronic upper extremity pain (Right); Chronic thoracic back pain (Bilateral) (L>R); Trigger point of thoracic region (Bilateral) (L>R); Other specified dorsopathies, sacral and sacrococcygeal region; Sacroiliac joint dysfunction (Right); Bilateral sacroiliitis (HCC); Somatic dysfunction of sacroiliac joint (Right); Chronic musculoskeletal pain; Facial pain; Chronic hip pain (Right); Osteoarthritis of hip (Right); Left ear pain; 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HCC); Abnormal MRI, lumbar spine (03/14/2024); Osteoarthritis involving multiple joints; Other spondylosis, sacral and sacrococcygeal region; Lumbar facet hypertrophy (Multilevel) (Bilateral); Chronic knee pain (Right); Osteoarthritis of knee (Right); Trigger point of shoulder region (Right); Osteoarthritis of AC (acromioclavicular) joint (Right); Osteoarthritis of shoulder (Right); Unspecified injury of muscle(s) and tendon(s) of the rotator cuff of shoulder, sequela (Right); Cervicalgia; DDD (degenerative disc disease), lumbar; Lumbar radicular pain (Bilateral); Lumbar foraminal stenosis (Right: L3-4, L4-5, L5-S1) (Left: L2-3); Abnormal MRI, hip (Right) (05/04/2022); Abnormal MRI, knee (Right) (11/29/2023); Tricompartment osteoarthritis of knee (Right); Chronic low back pain (Right) w/o sciatica; Hoffa's fat pad disease (HCC) (Right); Hoffa's pad syndrome (HCC) (Right); Chronic low back pain (Right) w/ sciatica (Right); Lower extremity weakness (Right); Chronic radicular pain of lower back; Chronic sciatica (Right); Lumbosacral radiculopathy at L5 (Right); Low back pain of over 3 months duration; Personal history of breast cancer; Pes anserine bursitis (Right); Infrapatellar bursitis of knee (Right); Traumatic ecchymosis of right lower leg; Lumbar facet joint pain; Medial knee pain (Right); Chronic patellofemoral pain of knee (Right); Patellofemoral joint pain (Right); Knee  hemarthrosis (Right); Pes anserinus bursitis of right knee; Abnormal MRI, shoulder (Right) (06/21/2023); Wound, open, leg; Chronic hip pain (Left); Osteoarthritis of hips (Bilateral) (R>L); Intractable back pain, acute on chronic; Intermittent low back pain; Trochanteric bursitis of left hip; Sprain of left shoulder, initial encounter; Chronic shoulder pain (Left); Osteoarthritis of AC (acromioclavicular) joint (Left); Arthralgia of acromioclavicular joint (Left); Osteoarthritis of shoulder (Left); Trigger point of shoulder region (Left); Mechanical low  back pain; Multifactorial low back pain; Chronic shoulder pain (Bilateral); and Arthralgia of multiple sites on their pertinent problem list. Pain Assessment: Severity of Chronic pain is reported as a 10-Worst pain ever/10. Location: Shoulder Left/Hurts everywhere. Onset: More than a month ago. Quality: Aching, Burning, Constant, Sharp, Throbbing. Timing: Constant. Modifying factor(s): nothing. Vitals:  height is 5\' 3"  (1.6 m) and weight is 155 lb (70.3 kg). Her temperature is 97 F (36.1 C) (abnormal). Her blood pressure is 118/84 and her pulse is 79. Her respiration is 18.  BMI: Estimated body mass index is 27.46 kg/m as calculated from the following:   Height as of this encounter: 5\' 3"  (1.6 m).   Weight as of this encounter: 155 lb (70.3 kg). Last encounter: 04/23/2024. Last procedure: 04/17/2024.  Reason for encounter: medication management.  The patient returns today indicating that she has thought about it and she wants to switch to the Duragesic  patch instead of the oxycodone .  Today we will take care of this for this patient.  The patient was also interested in perhaps taking some oral steroids for long-term.  This is not something that we have experience with and for that reason we are sending the patient to rheumatology for evaluation and consideration into providing this patient with long-term oral steroid therapy.  Discussed the use of AI scribe  software for clinical note transcription with the patient, who gave verbal consent to proceed.  History of Present Illness   Wanda Martinez is an 89 year old female who presents for pain management and consideration of switching to a fentanyl  patch.  She has been experiencing chronic pain and has been on opioid medications, which initially provided relief but have since become less effective due to tolerance. Her current pain medication, taken in pill form, was effective for a short period but the pain has returned to its previous level.  She is exploring alternative pain management options, including the fentanyl  patch, to achieve better pain control.      Pharmacotherapy Assessment  Analgesic: Oxycodone  IR 5 mg, 1 tab PO q 8 hrs (15 mg/day of oxycodone ) MME/day: 22.5 mg/day.    Monitoring: [place] PMP: PDMP reviewed during this encounter.       Pharmacotherapy: No side-effects or adverse reactions reported. Compliance: No problems identified. Effectiveness: Clinically acceptable.  Humberto Magnus, RN  05/07/2024  2:00 PM  Sign when Signing Visit Nursing Pain Medication Assessment:  Safety precautions to be maintained throughout the outpatient stay will include: orient to surroundings, keep bed in low position, maintain call bell within reach at all times, provide assistance with transfer out of bed and ambulation.  Medication Inspection Compliance: Pill count conducted under aseptic conditions, in front of the patient. Neither the pills nor the bottle was removed from the patient's sight at any time. Once count was completed pills were immediately returned to the patient in their original bottle.  Medication: Oxycodone  IR Pill/Patch Count: 27 of 120 pills/patches remain Pill/Patch Appearance: Markings consistent with prescribed medication Bottle Appearance: Standard pharmacy container. Clearly labeled. Filled Date: 04 / 26 / 2025 Last Medication intake:  Today    No results found for:  "CBDTHCR" No results found for: "D8THCCBX" No results found for: "D9THCCBX"  UDS:  Summary  Date Value Ref Range Status  04/23/2024 FINAL  Final    Comment:    ==================================================================== Compliance Drug Analysis, Ur ==================================================================== Test  Result       Flag       Units  Drug Present and Declared for Prescription Verification   Oxycodone                       4533         EXPECTED   ng/mg creat   Noroxycodone                   4070         EXPECTED   ng/mg creat    Sources of oxycodone  include scheduled prescription medications.    Noroxycodone is an expected metabolite of oxycodone .  Drug Present not Declared for Prescription Verification   Gabapentin                      PRESENT      UNEXPECTED   Baclofen                        PRESENT      UNEXPECTED   Citalopram                      PRESENT      UNEXPECTED   Desmethylcitalopram            PRESENT      UNEXPECTED    Desmethylcitalopram is an expected metabolite of citalopram  or the    enantiomeric form, escitalopram.    Acetaminophen                   PRESENT      UNEXPECTED   Lidocaine                       PRESENT      UNEXPECTED   Metoprolol                      PRESENT      UNEXPECTED ==================================================================== Test                      Result    Flag   Units      Ref Range   Creatinine              106              mg/dL      >=62 ==================================================================== Declared Medications:  The flagging and interpretation on this report are based on the  following declared medications.  Unexpected results may arise from  inaccuracies in the declared medications.   **Note: The testing scope of this panel includes these medications:   Oxycodone    **Note: The testing scope of this panel does not include the  following reported  medications:   Omega-3 Fatty Acids  Pantoprazole  (Protonix )  Polyethylene Glycol (MiraLAX )  Prednisone  (Deltasone )  Rosuvastatin  (Crestor ) ==================================================================== For clinical consultation, please call [phone]. ====================================================================       ROS  Constitutional: Denies any fever or chills Gastrointestinal: No reported hemesis, hematochezia, vomiting, or acute GI distress Musculoskeletal: Denies any acute onset joint swelling, redness, loss of ROM, or weakness Neurological: No reported episodes of acute onset apraxia, aphasia, dysarthria, agnosia, amnesia, paralysis, loss of coordination, or loss of consciousness  Medication Review  AMBULATORY NON FORMULARY MEDICATION, Glucosamine-Chondroitin, Omega-3, acetaminophen , amLODipine , aspirin EC, calcium  carbonate, citalopram , donepezil , fentaNYL , gabapentin , hydrochlorothiazide , losartan , magnesium  oxide, melatonin, meloxicam , methocarbamol , metoprolol  succinate, mirabegron  ER, multivitamin, naloxone ,  oxyCODONE , pantoprazole , polyethylene glycol, predniSONE , and rosuvastatin   History Review  Allergy: Wanda Martinez is allergic to sulfa antibiotics and vesicare [solifenacin]. Drug: Wanda Martinez  reports current drug use. Alcohol:  reports no history of alcohol use. Tobacco:  reports that she has never smoked. She has never been exposed to tobacco smoke. She has never used smokeless tobacco. Social: Wanda Martinez  reports that she has never smoked. She has never been exposed to tobacco smoke. She has never used smokeless tobacco. She reports current drug use. She reports that she does not drink alcohol. Medical:  has a past medical history of Acute postoperative pain (10/25/2017), Anemia, Arm pain (07/26/2015), Arthritis, Arthritis, degenerative (03/26/2014), Back pain (11/01/2013), Bladder infection (06/2018), Breast cancer (HCC), Breast cancer (HCC), CHEST PAIN  (04/29/2010), Chronic cystitis, Cystocele (02/22/2013), Cystocele, midline (08/19/2013), Degeneration of intervertebral disc of lumbosacral region (03/26/2014), DYSPNEA (04/29/2010), Enthesopathy of hip (03/26/2014), GERD (gastroesophageal reflux disease), Hiatal hernia, HTN (hypertension), Hypokalemia (06/2018), Hyponatremia (06/2018), LBP (low back pain) (03/26/2014), Neck pain (11/01/2013), Parkinson disease (HCC), Pneumonia (06/2018), Sinusitis (02/07/2015), Skin lesions (07/12/2014), and Urinary incontinence. Surgical: Wanda Martinez  has a past surgical history that includes Tonsillectomy and adenoidectomy (1979); Vesicovaginal fistula closure w/ TAH (1983); Breast surgery (1986); Breast enhancement surgery (1987); Breast implant removal; Breast implant removal (Right, 08/29/2012); Mastectomy (08/2012); Abdominal hysterectomy; Colonoscopy with propofol  (N/A, 09/13/2016); Breast biopsy (2013); and Blepharoplasty. Family: family history includes Colon polyps in her father; Diabetes in her father; Parkinson's disease in her mother; Stroke in her father and mother.  Laboratory Chemistry Profile   Renal Lab Results  Component Value Date   BUN 43 (H) 04/24/2024   CREATININE 0.96 04/24/2024   BCR 38 (H) 04/23/2024   GFR 46.06 (L) 02/18/2024   GFRAA >60 07/27/2020   GFRNONAA 58 (L) 04/24/2024    Hepatic Lab Results  Component Value Date   AST 22 04/24/2024   ALT 20 04/24/2024   ALBUMIN 3.1 (L) 04/24/2024   ALKPHOS 26 (L) 04/24/2024    Electrolytes Lab Results  Component Value Date   NA 132 (L) 04/24/2024   K 3.7 04/24/2024   CL 98 04/24/2024   CALCIUM  8.8 (L) 04/24/2024   MG 2.4 (H) 04/23/2024    Bone Lab Results  Component Value Date   VD25OH 66.95 09/26/2022   25OHVITD1 61 04/23/2024   25OHVITD2 <1.0 04/23/2024   25OHVITD3 60 04/23/2024    Inflammation (CRP: Acute Phase) (ESR: Chronic Phase) Lab Results  Component Value Date   CRP 27 (H) 04/23/2024   ESRSEDRATE 15 04/23/2024          Note: Above Lab results reviewed.  Recent Imaging Review  CT ABDOMEN PELVIS W CONTRAST CLINICAL DATA:  Sharp right lower quadrant pain associated with low blood pressure and dehydration. Generalized weakness. Evaluate for intra-abdominal infection.  EXAM: CT ABDOMEN AND PELVIS WITH CONTRAST  TECHNIQUE: Multidetector CT imaging of the abdomen and pelvis was performed using the standard protocol following bolus administration of intravenous contrast.  RADIATION DOSE REDUCTION: This exam was performed according to the departmental dose-optimization program which includes automated exposure control, adjustment of the mA and/or kV according to patient size and/or use of iterative reconstruction technique.  CONTRAST:  OMNIPAQUE  IOHEXOL  300 MG/ML  SOLN  COMPARISON:  CT abdomen pelvis 02/23/2012  FINDINGS: Lower chest: No acute abnormality.  Hepatobiliary: No focal liver abnormality is seen. No gallstones, gallbladder wall thickening, or biliary dilatation.  Pancreas: Unremarkable.  Spleen: Unremarkable.  Adrenals/Urinary Tract: Normal adrenal glands. Bilateral cortical renal  scarring. No obstructing urinary calculi or hydronephrosis. Unremarkable bladder.  Stomach/Bowel: Stomach is within normal limits. No bowel obstruction. Normal appendix. Extensive sigmoid diverticulosis. Question mild wall thickening about the sigmoid colon versus smooth muscle hypertrophy. No adjacent fluid or abscess.  Vascular/Lymphatic: Aortic atherosclerosis. No enlarged abdominal or pelvic lymph nodes.  Reproductive: Hysterectomy.  No adnexal mass.  Other: No free intraperitoneal fluid or air.  Musculoskeletal: No acute fracture. Sclerosis in the right femoral head suspicious for AVN.  IMPRESSION: 1. Extensive sigmoid diverticulosis. Question mild sigmoid diverticulitis versus smooth muscle hypertrophy. No adjacent fluid or abscess. Otherwise no evidence of intra-abdominal  infection. 2. Aortic Atherosclerosis (ICD10-I70.0).  Electronically Signed   By: Rozell Cornet M.D.   On: 04/24/2024 22:47 Note: Reviewed         Physical Exam  General appearance: Well nourished, well developed, and well hydrated. In no apparent acute distress Mental status: Alert, oriented x 3 (person, place, & time)       Respiratory: No evidence of acute respiratory distress Eyes: PERLA Vitals: BP 118/84   Pulse 79   Temp (!) 97 F (36.1 C)   Resp 18   Ht 5\' 3"  (1.6 m)   Wt 155 lb (70.3 kg)   LMP 12/18/1981   BMI 27.46 kg/m  BMI: Estimated body mass index is 27.46 kg/m as calculated from the following:   Height as of this encounter: 5\' 3"  (1.6 m).   Weight as of this encounter: 155 lb (70.3 kg). Ideal: Ideal body weight: 52.4 kg (115 lb 8.3 oz) Adjusted ideal body weight: 59.6 kg (131 lb 5 oz)  Assessment   Diagnosis Status  1. Chronic pain syndrome   2. Chronic upper back pain   3. Pharmacologic therapy   4. Chronic use of opiate for therapeutic purpose   5. Encounter for medication management   6. Encounter for chronic pain management   7. Chronic pain of right knee   8. Primary osteoarthritis of right knee   9. Tricompartment osteoarthritis of right knee   10. Chronic right shoulder pain   11. Unspecified injury of muscle(s) and tendon(s) of the rotator cuff of right shoulder, sequela   12. Facet syndrome, lumbar    Controlled Controlled Controlled   Updated Problems: No problems updated.  Plan of Care  Problem-specific:  Assessment and Plan    Chronic pain management   Chronic pain requires management with opioid therapy. Current oral medications are less effective due to tolerance. She will switch to a fentanyl  patch. Opioid tolerance and the potential need for a drug holiday to reset opioid receptors were discussed. Switching opioids within the same family may not fully address tolerance issues. A referral to rheumatology is made for evaluation of  oral steroid therapy as an adjunctive treatment option. Potential risks of long-term steroid use were discussed, with detailed discussion deferred to rheumatology. Prescribe fentanyl  patch for one month. Schedule follow-up in one month to assess efficacy of the fentanyl  patch. Refer to rheumatology for evaluation of oral steroid therapy.       Wanda Martinez has a current medication list which includes the following long-term medication(s): amlodipine , calcium  carbonate, citalopram , citalopram , donepezil , fentanyl , gabapentin , hydrochlorothiazide , losartan , magnesium  oxide, metoprolol  succinate, mirabegron  er, [START ON 05/12/2024] oxycodone , pantoprazole , and rosuvastatin .  Pharmacotherapy (Medications Ordered): Meds ordered this encounter  Medications   fentaNYL  (DURAGESIC ) 12 MCG/HR    Sig: Place 1 patch onto the skin every 3 (three) days. Must last 30 days.  Dispense:  10 patch    Refill:  0    Chronic Pain: STOP Act (Not applicable) Fill 1 day early if closed on refill date. Avoid benzodiazepines within 8 hours of opioids   Orders:  Orders Placed This Encounter  Procedures   Ambulatory referral to Rheumatology    Referral Priority:   Routine    Referral Type:   Consultation    Referral Reason:   Specialty Services Required    Requested Specialty:   Rheumatology    Number of Visits Requested:   1   Follow-up plan:   Return in about 30 days (around 06/06/2024) for Eval-day (M,W), (F2F), (MM), for medication review/adjustment (Duragesic  trial).     Interventional Therapies  Risk Factors  Considerations  Medical Comorbidities:  Parkinson's disease  Hx right breast cancer  OIC  SOB  CKD Stage 3b  GERD  depression  osteopenia/osteoporosis  PSVT  Memory impairment      Planned  Pending:   No further interventional therapies or steroids until after 06/17/2024.  Today we will conduct a workup to see if the patient is having adrenal insufficiency.   Under  consideration:  Therapeutic right suprascapular nerve RFA #1  Possible intrathecal pump trial and permanent implant    Completed:   Diagnostic left suprascapular NB x1 (04/17/2024) (10-0/10)  Diagnostic left infraspinatus TPI/MNB x1 (04/01/2024) (0/0/0/0)  Therapeutic bilateral lumbar facet Blk x15 (03/20/2024)  Therapeutic bilateral lumbar facet RFA x R4L2  Diagnostic/therapeutic right suprascapular NB x2  Therapeutic bilateral trapezius/thoracic TPI x R6L1  Therapeutic right lumbar TPI x1  Therapeutic bilateral SI inj. x R3L2  Therapeutic right SI RFA x4  Therapeutic right knee inj. (steroid x5) (Zilretta  x3) (Monovisc x1) (pes anserine x2) (12/06/2023)  Therapeutic TFESI (bilateral: L5, L2 x1) (right: L5 x4, L4 x1, L3 x1)  Therapeutic LESI (midline: L2-3 x1) (right: L3-4 x2, L4-5 x2, L5-S1 x2)    Therapeutic  Palliative (PRN) options:   Do not schedule any procedures without a proper preprocedure evaluation.   Completed by other providers:   Therapeutic right knee (ORTHOVISC) inj. x3 (05/18/2023, 05/23/2023, 05/30/2023) by Mickeal Aland, MD [facility]) Therapeutic left knee (Kenalog ) inj. x1 (08/25/2020) by Candise Chambers, MD [facility])   Pharmacotherapy  Nonopioids transfer 12/21/2020: Gabapentin        Recent Visits Date Type Provider Dept  04/23/24 Office Visit Renaldo Caroli, MD [facility]  04/17/24 Procedure visit Renaldo Caroli, MD [facility]  04/01/24 Office Visit Renaldo Caroli, MD [facility]  03/26/24 Office Visit Renaldo Caroli, MD [facility]  03/20/24 Procedure visit Renaldo Caroli, MD [facility]  02/11/24 Office Visit Renaldo Caroli, MD [facility]  Showing recent visits within past 90 days and meeting all other requirements Today's Visits Date Type Provider Dept  05/07/24 Office Visit Renaldo Caroli, MD [facility]  Showing today's visits and meeting  all other requirements Future Appointments Date Type Provider Dept  05/28/24 Appointment Renaldo Caroli, MD [facility]  06/11/24 Appointment Renaldo Caroli, MD [facility]  Showing future appointments within next 90 days and meeting all other requirements   I discussed the assessment and treatment plan with the patient. The patient was provided an opportunity to ask questions and all were answered. The patient agreed with the plan and demonstrated an understanding of the instructions.  Patient advised to call back or seek an in-person evaluation if the symptoms or condition worsens.  Duration of encounter: 30 minutes.  Total time on encounter, as per AMA guidelines included both the face-to-face and non-face-to-face time personally spent by the physician and/or other qualified health care professional(s) on the day of the encounter (includes time in activities that require the physician or other qualified health care professional and does not include time in activities normally performed by clinical staff). Physician's time may include the following activities when performed: Preparing to see the patient (e.g., pre-charting review of records, searching for previously ordered imaging, lab work, and nerve conduction tests) Review of prior analgesic pharmacotherapies. Reviewing PMP Interpreting ordered tests (e.g., lab work, imaging, nerve conduction tests) Performing post-procedure evaluations, including interpretation of diagnostic procedures Obtaining and/or reviewing separately obtained history Performing a medically appropriate examination and/or evaluation Counseling and educating the patient/family/caregiver Ordering medications, tests, or procedures Referring and communicating with other health care professionals (when not separately reported) Documenting clinical information in the electronic or other health record Independently interpreting results (not  separately reported) and communicating results to the patient/ family/caregiver Care coordination (not separately reported)  Note by: Wanda Chafe, MD (TTS and AI technology used. I apologize for any typographical errors that were not detected and corrected.) Date: 05/07/2024; Time: 3:00 PM

## 2024-05-07 NOTE — Progress Notes (Signed)
 Nursing Pain Medication Assessment:  Safety precautions to be maintained throughout the outpatient stay will include: orient to surroundings, keep bed in low position, maintain call bell within reach at all times, provide assistance with transfer out of bed and ambulation.  Medication Inspection Compliance: Pill count conducted under aseptic conditions, in front of the patient. Neither the pills nor the bottle was removed from the patient's sight at any time. Once count was completed pills were immediately returned to the patient in their original bottle.  Medication: Oxycodone  IR Pill/Patch Count: 27 of 120 pills/patches remain Pill/Patch Appearance: Markings consistent with prescribed medication Bottle Appearance: Standard pharmacy container. Clearly labeled. Filled Date: 04 / 26 / 2025 Last Medication intake:  Today

## 2024-05-08 ENCOUNTER — Telehealth: Payer: Self-pay

## 2024-05-08 DIAGNOSIS — G8929 Other chronic pain: Secondary | ICD-10-CM

## 2024-05-08 DIAGNOSIS — Z853 Personal history of malignant neoplasm of breast: Secondary | ICD-10-CM

## 2024-05-08 DIAGNOSIS — M545 Low back pain, unspecified: Secondary | ICD-10-CM

## 2024-05-08 DIAGNOSIS — N1832 Chronic kidney disease, stage 3b: Secondary | ICD-10-CM

## 2024-05-08 NOTE — Telephone Encounter (Signed)
 Spoke with pt's son to let him know that the referral had been placed a couple of times but that the pt had declined the services. I also let him know that the last time this was done it was put in the referral that they need to contact him to schedule. Son stated that he called last week and was told all of this and asked that the referral be placed again. That message did not get routed. I let son know that we would put it in now and if he does not hear anything by Wednesday next week to lwt us  know so we can follow up on the referral.

## 2024-05-08 NOTE — Addendum Note (Signed)
 Addended by: Raejean Bullock on: 05/08/2024 10:22 PM   Modules accepted: Orders

## 2024-05-08 NOTE — Telephone Encounter (Signed)
Order placed for referral to palliative care

## 2024-05-08 NOTE — Telephone Encounter (Signed)
 Copied from CRM [id number]. Topic: Clinical - Medical Advice >> Apr 24, 2024  3:19 PM Allyne Areola wrote: Reason for CRM: Adaleen Hulgan is calling because Patient was sent to the ED by her pain management doctor. She wanted to let Dr.Scott know.They also wanted to follow up on the palliative care referral that should have been placed by Dr.Scott as well as requesting a hospital bed for their home to be comfortable for the patient. >> May 08, 2024 12:09 PM Fredrica W wrote: Patient son called back. States he needs palliative care set up for mother ASAP. There was an order but it was closed so he requested a new one. Mother is not able to get out of the bed. He would like someone to come out as soon as they can. CallbackGeralyn Knee (Child) [phone]  Thank You

## 2024-05-08 NOTE — Telephone Encounter (Signed)
 Copied from CRM [id number]. Topic: General - Call Back - No Documentation >> May 08, 2024  4:29 PM Shereese L wrote: Reason for CRM: Patient son called back. States he needs palliative care set up for mother ASAP and request a call back from [place]

## 2024-05-14 DIAGNOSIS — K219 Gastro-esophageal reflux disease without esophagitis: Secondary | ICD-10-CM | POA: Diagnosis not present

## 2024-05-14 DIAGNOSIS — E785 Hyperlipidemia, unspecified: Secondary | ICD-10-CM | POA: Diagnosis not present

## 2024-05-14 DIAGNOSIS — I10 Essential (primary) hypertension: Secondary | ICD-10-CM | POA: Diagnosis not present

## 2024-05-14 DIAGNOSIS — E876 Hypokalemia: Secondary | ICD-10-CM | POA: Diagnosis not present

## 2024-05-15 ENCOUNTER — Telehealth: Payer: Self-pay

## 2024-05-15 DIAGNOSIS — M545 Low back pain, unspecified: Secondary | ICD-10-CM | POA: Diagnosis not present

## 2024-05-15 DIAGNOSIS — D649 Anemia, unspecified: Secondary | ICD-10-CM | POA: Diagnosis not present

## 2024-05-15 DIAGNOSIS — Z9181 History of falling: Secondary | ICD-10-CM | POA: Diagnosis not present

## 2024-05-15 DIAGNOSIS — I10 Essential (primary) hypertension: Secondary | ICD-10-CM | POA: Diagnosis not present

## 2024-05-15 DIAGNOSIS — M461 Sacroiliitis, not elsewhere classified: Secondary | ICD-10-CM | POA: Diagnosis not present

## 2024-05-15 DIAGNOSIS — E871 Hypo-osmolality and hyponatremia: Secondary | ICD-10-CM | POA: Diagnosis not present

## 2024-05-15 DIAGNOSIS — M25511 Pain in right shoulder: Secondary | ICD-10-CM | POA: Diagnosis not present

## 2024-05-15 DIAGNOSIS — K5900 Constipation, unspecified: Secondary | ICD-10-CM | POA: Diagnosis not present

## 2024-05-15 DIAGNOSIS — G8929 Other chronic pain: Secondary | ICD-10-CM | POA: Diagnosis not present

## 2024-05-15 DIAGNOSIS — M25561 Pain in right knee: Secondary | ICD-10-CM | POA: Diagnosis not present

## 2024-05-15 DIAGNOSIS — M7062 Trochanteric bursitis, left hip: Secondary | ICD-10-CM | POA: Diagnosis not present

## 2024-05-15 DIAGNOSIS — Z853 Personal history of malignant neoplasm of breast: Secondary | ICD-10-CM | POA: Diagnosis not present

## 2024-05-15 NOTE — Telephone Encounter (Signed)
 FYI-  Called patient to check on her. She is ok, did not hit head, no LOC. She confirmed doing ok. No other injury noted.

## 2024-05-15 NOTE — Telephone Encounter (Signed)
 Copied from CRM [id number]. Topic: General - Other >> May 15, 2024  4:20 PM Tiffany S wrote: Reason for CRM: Patient family wants a local referral  for  palliative care please follow up

## 2024-05-15 NOTE — Telephone Encounter (Signed)
 Copied from CRM [id number]. Topic: Clinical - Medical Advice >> May 15, 2024 11:59 AM Magdalene School wrote: Reason for CRM: Britta Candy PT calling from [facility] home health to inform PCP that patient has had a fall no injuries doing reassessment will continue physical and occupational therapy and details will be faxed over.  Call back number: [phone]

## 2024-05-16 NOTE — Telephone Encounter (Signed)
 This referral has been placed several times and it was placed again yesterday. Please give the son a call to get set up.

## 2024-05-16 NOTE — Telephone Encounter (Signed)
 Copied from CRM [id number]. Topic: General - Other >> May 16, 2024  2:53 PM Magdalene School wrote: Reason for CRM: Aisa from [employer] returning [employer].  Call back number: [phone]

## 2024-05-16 NOTE — Telephone Encounter (Signed)
 See previous notes. Palliative care referral has been placed.

## 2024-05-16 NOTE — Telephone Encounter (Signed)
 Called pt's son to let him know that the referral is being processed. Son stated that someone from [facility] called and spoke with his sister and mother yesterday. He stated that he was not able to be there for the call but his sister told him that she was told the visit would only be once a month virtually. Son doesn't understand because he was told that as long as their address was [place] they could have home visits. Son had the number that called them so I let him know that I would reach out and try to figure out what is going on. Spoke with [facility] and they stated that from the notes fro mthe social worker that called them she was requesting discharge but there wasn't any explanation of why she was requesting discharge. The lady I was speaking with stated that she would send a message to [place], the Child psychotherapist, and have her give us  a call so we can figure out what is going on with this referral.

## 2024-05-19 ENCOUNTER — Ambulatory Visit: Admitting: Pain Medicine

## 2024-05-21 DIAGNOSIS — M461 Sacroiliitis, not elsewhere classified: Secondary | ICD-10-CM | POA: Diagnosis not present

## 2024-05-21 DIAGNOSIS — D649 Anemia, unspecified: Secondary | ICD-10-CM | POA: Diagnosis not present

## 2024-05-21 DIAGNOSIS — M545 Low back pain, unspecified: Secondary | ICD-10-CM | POA: Diagnosis not present

## 2024-05-21 DIAGNOSIS — Z853 Personal history of malignant neoplasm of breast: Secondary | ICD-10-CM | POA: Diagnosis not present

## 2024-05-21 DIAGNOSIS — N1832 Chronic kidney disease, stage 3b: Secondary | ICD-10-CM | POA: Diagnosis not present

## 2024-05-21 DIAGNOSIS — E785 Hyperlipidemia, unspecified: Secondary | ICD-10-CM | POA: Diagnosis not present

## 2024-05-21 DIAGNOSIS — M25561 Pain in right knee: Secondary | ICD-10-CM | POA: Diagnosis not present

## 2024-05-21 DIAGNOSIS — M25511 Pain in right shoulder: Secondary | ICD-10-CM | POA: Diagnosis not present

## 2024-05-21 DIAGNOSIS — M549 Dorsalgia, unspecified: Secondary | ICD-10-CM | POA: Diagnosis not present

## 2024-05-21 DIAGNOSIS — M7062 Trochanteric bursitis, left hip: Secondary | ICD-10-CM | POA: Diagnosis not present

## 2024-05-21 DIAGNOSIS — K219 Gastro-esophageal reflux disease without esophagitis: Secondary | ICD-10-CM | POA: Diagnosis not present

## 2024-05-21 DIAGNOSIS — Z9181 History of falling: Secondary | ICD-10-CM | POA: Diagnosis not present

## 2024-05-21 DIAGNOSIS — I10 Essential (primary) hypertension: Secondary | ICD-10-CM | POA: Diagnosis not present

## 2024-05-21 DIAGNOSIS — G8929 Other chronic pain: Secondary | ICD-10-CM | POA: Diagnosis not present

## 2024-05-21 DIAGNOSIS — E871 Hypo-osmolality and hyponatremia: Secondary | ICD-10-CM | POA: Diagnosis not present

## 2024-05-21 DIAGNOSIS — D631 Anemia in chronic kidney disease: Secondary | ICD-10-CM | POA: Diagnosis not present

## 2024-05-21 DIAGNOSIS — E876 Hypokalemia: Secondary | ICD-10-CM | POA: Diagnosis not present

## 2024-05-21 DIAGNOSIS — K5900 Constipation, unspecified: Secondary | ICD-10-CM | POA: Diagnosis not present

## 2024-05-21 NOTE — Telephone Encounter (Addendum)
 LMTCB with [place]. Need to get information on why pt was discharged from palliative care. See notes below.

## 2024-05-22 DIAGNOSIS — E871 Hypo-osmolality and hyponatremia: Secondary | ICD-10-CM | POA: Diagnosis not present

## 2024-05-22 DIAGNOSIS — M25561 Pain in right knee: Secondary | ICD-10-CM | POA: Diagnosis not present

## 2024-05-22 DIAGNOSIS — K5900 Constipation, unspecified: Secondary | ICD-10-CM | POA: Diagnosis not present

## 2024-05-22 DIAGNOSIS — Z853 Personal history of malignant neoplasm of breast: Secondary | ICD-10-CM | POA: Diagnosis not present

## 2024-05-22 DIAGNOSIS — G8929 Other chronic pain: Secondary | ICD-10-CM | POA: Diagnosis not present

## 2024-05-22 DIAGNOSIS — M545 Low back pain, unspecified: Secondary | ICD-10-CM | POA: Diagnosis not present

## 2024-05-22 DIAGNOSIS — M25511 Pain in right shoulder: Secondary | ICD-10-CM | POA: Diagnosis not present

## 2024-05-22 DIAGNOSIS — D649 Anemia, unspecified: Secondary | ICD-10-CM | POA: Diagnosis not present

## 2024-05-22 DIAGNOSIS — M7062 Trochanteric bursitis, left hip: Secondary | ICD-10-CM | POA: Diagnosis not present

## 2024-05-22 DIAGNOSIS — I10 Essential (primary) hypertension: Secondary | ICD-10-CM | POA: Diagnosis not present

## 2024-05-22 DIAGNOSIS — Z9181 History of falling: Secondary | ICD-10-CM | POA: Diagnosis not present

## 2024-05-22 DIAGNOSIS — M461 Sacroiliitis, not elsewhere classified: Secondary | ICD-10-CM | POA: Diagnosis not present

## 2024-05-23 ENCOUNTER — Other Ambulatory Visit: Payer: Self-pay | Admitting: Pain Medicine

## 2024-05-23 DIAGNOSIS — G894 Chronic pain syndrome: Secondary | ICD-10-CM

## 2024-05-23 DIAGNOSIS — M1711 Unilateral primary osteoarthritis, right knee: Secondary | ICD-10-CM

## 2024-05-23 DIAGNOSIS — Z79899 Other long term (current) drug therapy: Secondary | ICD-10-CM

## 2024-05-23 DIAGNOSIS — S46001S Unspecified injury of muscle(s) and tendon(s) of the rotator cuff of right shoulder, sequela: Secondary | ICD-10-CM

## 2024-05-23 DIAGNOSIS — G8929 Other chronic pain: Secondary | ICD-10-CM

## 2024-05-23 DIAGNOSIS — M47816 Spondylosis without myelopathy or radiculopathy, lumbar region: Secondary | ICD-10-CM

## 2024-05-27 NOTE — Progress Notes (Unsigned)
 PROVIDER NOTE: Interpretation of information contained herein should be left to medically-trained personnel. Specific patient instructions are provided elsewhere under Patient Instructions section of medical record. This document was created in part using AI and STT-dictation technology, any transcriptional errors that may result from this process are unintentional.  Patient: Wanda Martinez  Service: E/M   PCP: Dellar Fenton, MD  DOB: 1935-09-28  DOS: 05/28/2024  Provider: Candi Chafe, MD  MRN: [id number]  Delivery: Face-to-face  Specialty: Interventional Pain Management  Type: Established Patient  Setting: Ambulatory outpatient facility  Specialty designation: 09  Referring Prov.: Dellar Fenton, MD  Location: Outpatient office facility       History of present illness (HPI) Ms. Jilleen Shunte Senseney, a 89 y.o. year old female, is here today because of her Chronic right shoulder pain [M25.511, G89.29]. Ms. Vanwyk primary complain today is Knee Pain (right), Shoulder Pain, and Hip Pain (Right-)  Pertinent problems: Ms. Hefley has Headache; Chronic low back pain (1ry area of Pain) (Bilateral) (R>L) w/o sciatica; Lumbar spondylosis; Chronic hip pain (Bilateral); Chronic neck pain; Cervical spondylosis; Chronic cervical radicular pain (Right); Diffuse myofascial pain syndrome; Neurogenic pain; Chronic upper back pain (Right); Myofascial pain syndrome (Right) (cervicothoracic); Lumbar facet syndrome (Bilateral) (R>L); History of breast cancer; Cervical facet hypertrophy; Cervical facet syndrome (HCC); Chronic shoulder pain (Right); Chronic pain syndrome; Chronic sacroiliac joint pain (Left); Chronic sacroiliac joint pain (Right); 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, L4-5, L5-S1) (Right); Lumbar spinal stenosis (with neurogenic claudication) (L3-4); Chronic lower extremity pain (Right); Chronic lumbar radicular pain (Right); Trochanteric bursitis of hip (Bilateral); Spondylosis without  myelopathy or radiculopathy, lumbosacral region; Trigger point with back pain (Right); DDD (degenerative disc disease), lumbosacral; Chronic upper extremity pain (Right); Chronic thoracic back pain (Bilateral) (L>R); Trigger point of thoracic region (Bilateral) (L>R); Other specified dorsopathies, sacral and sacrococcygeal region; Sacroiliac joint dysfunction (Right); Bilateral sacroiliitis (HCC); Somatic dysfunction of sacroiliac joint (Right); Chronic musculoskeletal pain; Facial pain; Chronic hip pain (Right); Osteoarthritis of hip (Right); Left ear pain; 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HCC); Abnormal MRI, lumbar spine (03/14/2024); Osteoarthritis involving multiple joints; Other spondylosis, sacral and sacrococcygeal region; Lumbar facet hypertrophy (Multilevel) (Bilateral); Chronic knee pain (Right); Osteoarthritis of knee (Right); Trigger point of shoulder region (Right); Osteoarthritis of AC (acromioclavicular) joint (Right); Osteoarthritis of shoulder (Right); Unspecified injury of muscle(s) and tendon(s) of the rotator cuff of shoulder, sequela (Right); Cervicalgia; DDD (degenerative disc disease), lumbar; Lumbar radicular pain (Bilateral); Lumbar foraminal stenosis (Right: L3-4, L4-5, L5-S1) (Left: L2-3); Abnormal MRI, hip (Right) (05/04/2022); Abnormal MRI, knee (Right) (11/29/2023); Tricompartment osteoarthritis of knee (Right); Chronic low back pain (Right) w/o sciatica; Hoffa's fat pad disease (HCC) (Right); Hoffa's pad syndrome (HCC) (Right); Chronic low back pain (Right) w/ sciatica (Right); Lower extremity weakness (Right); Chronic radicular pain of lower back; Chronic sciatica (Right); Lumbosacral radiculopathy at L5 (Right); Low back pain of over 3 months duration; Personal history of breast cancer; Pes anserine bursitis (Right); Infrapatellar bursitis of knee (Right); Traumatic ecchymosis of right lower leg; Lumbar facet joint pain; Medial knee pain (Right); Chronic patellofemoral pain of knee  (Right); Patellofemoral joint pain (Right); Knee hemarthrosis (Right); Pes anserinus bursitis of right knee; Abnormal MRI, shoulder (Right) (06/21/2023); Wound, open, leg; Chronic hip pain (Left); Osteoarthritis of hips (Bilateral) (R>L); Intractable back pain, acute on chronic; Intermittent low back pain; Trochanteric bursitis of left hip; Sprain of left shoulder, initial encounter; Chronic shoulder pain (Left); Osteoarthritis of AC (acromioclavicular) joint (Left); Arthralgia of acromioclavicular joint (Left); Osteoarthritis of shoulder (Left); Trigger point of  shoulder region (Left); Mechanical low back pain; Multifactorial low back pain; Chronic shoulder pain (Bilateral); and Arthralgia of multiple sites on their pertinent problem list.  Pain Assessment: Severity of Chronic pain is reported as a 8 /10. Location: Knee Right/I hurt everywhere, knee is the worst today. Onset: More than a month ago. Quality: Aching, Discomfort, Sore, Tiring. Timing: Constant. Modifying factor(s): I rub it, and put cream on it. Vitals:  height is 5' 3 (1.6 m) and weight is 152 lb (68.9 kg). Her temperature is 97 F (36.1 C) (abnormal). Her blood pressure is 104/71. Her respiration is 16 and oxygen saturation is 100%.  BMI: Estimated body mass index is 26.93 kg/m as calculated from the following:   Height as of this encounter: 5' 3 (1.6 m).   Weight as of this encounter: 152 lb (68.9 kg).  Last encounter: 05/07/2024. Last procedure: 04/17/2024.  Reason for encounter: post-procedure evaluation and assessment.   Discussed the use of AI scribe software for clinical note transcription with the patient, who gave verbal consent to proceed.  History of Present Illness   Wanda Martinez is an 89 year old female who presents with severe right knee pain.  She describes the right knee pain as persistent and significantly impacting her daily activities. She is seeking an injection to alleviate the discomfort.  She has  a history of chronic back pain, particularly on the right side, which has worsened over time. It has been a long time since any procedures were performed on her back.  For pain management, she is currently using fentanyl  patches but finds them less effective compared to her previous use of oxycodone . She plans to use her remaining fentanyl  patches before transitioning back to oxycodone , which she found more effective and without side effects such as sleepiness. Her prescriptions are managed through [facility].     Post-Procedure Evaluation   Procedure: Suprascapular nerve block (SSNB) #1  Laterality:  Left  Level: Superior to scapular spine, lateral to supraspinatus fossa (Suprascapular notch).  Imaging: Fluoroscopic guidance Non-spinal ([id number]) Anesthesia: Local anesthesia (1-2% Lidocaine ) Anxiolysis: None                 Sedation: No Sedation                       DOS: 04/17/2024  Performed by: Candi Chafe, MD  Purpose: Diagnostic/Therapeutic Indications: Shoulder pain, severe enough to impact quality of life and/or function. 1. Acute pain of left shoulder   2. Chronic shoulder pain (Left)   3. Osteoarthritis of AC (acromioclavicular) joint (Left)   4. Osteoarthritis of shoulder (Left)   5. Trigger point of shoulder region (Left)   6. Encounter for therapeutic procedure   7. Postop check    NAS-11 score:   Pre-procedure: 10-Worst pain ever/10   Post-procedure: 0-No pain/10     Effectiveness:  Initial hour after procedure: 100 %. Subsequent 4-6 hours post-procedure: 100 %. Analgesia past initial 6 hours:   The patient indicates having attained an ongoing 50% improvement of the left shoulder pain. Ongoing improvement:  Analgesic: The patient indicates having attained 100% relief of the pain for the duration of the local anesthetic followed by an ongoing 50% improvement in the left shoulder pain. Function: Ms. Flenniken reports improvement in function ROM: Ms.  Ramaswamy reports improvement in ROM   Pharmacotherapy Assessment   Analgesic: Oxycodone  IR 5 mg, 1 tab PO q 8 hrs (15 mg/day of oxycodone ) MME/day: 22.5  mg/day.    Monitoring: [place] PMP: PDMP reviewed during this encounter.       Pharmacotherapy: No side-effects or adverse reactions reported. Compliance: No problems identified. Effectiveness: Clinically acceptable.  Sibyl Drafts, RN  05/28/2024  1:02 PM  Sign when Signing Visit Nursing Pain Medication Assessment:  Safety precautions to be maintained throughout the outpatient stay will include: orient to surroundings, keep bed in low position, maintain call bell within reach at all times, provide assistance with transfer out of bed and ambulation.  Medication Inspection Compliance: Pill count conducted under aseptic conditions, in front of the patient. Neither the pills nor the bottle was removed from the patient's sight at any time. Once count was completed pills were immediately returned to the patient in their original bottle.  Medication: Oxycodone  IR Pill/Patch Count: 0 of 120 pills/patches remain Pill/Patch Appearance: Markings consistent with prescribed medication Bottle Appearance: Standard pharmacy container. Clearly labeled. Filled Date: 4 / 54 / 2025 Last Medication intake:  Today  Patient states she has one pill at home. In pill box.  Patient did not bring box of patches with her today. Patient and husband states she has two patches at home and is currently wearing here.    UDS:  Summary  Date Value Ref Range Status  04/23/2024 FINAL  Final    Comment:    ==================================================================== Compliance Drug Analysis, Ur ==================================================================== Test                             Result       Flag       Units  Drug Present and Declared for Prescription Verification   Oxycodone                       4533         EXPECTED   ng/mg creat    Noroxycodone                   4070         EXPECTED   ng/mg creat    Sources of oxycodone  include scheduled prescription medications.    Noroxycodone is an expected metabolite of oxycodone .  Drug Present not Declared for Prescription Verification   Gabapentin                      PRESENT      UNEXPECTED   Baclofen                        PRESENT      UNEXPECTED   Citalopram                      PRESENT      UNEXPECTED   Desmethylcitalopram            PRESENT      UNEXPECTED    Desmethylcitalopram is an expected metabolite of citalopram  or the    enantiomeric form, escitalopram.    Acetaminophen                   PRESENT      UNEXPECTED   Lidocaine                       PRESENT      UNEXPECTED   Metoprolol   PRESENT      UNEXPECTED ==================================================================== Test                      Result    Flag   Units      Ref Range   Creatinine              106              mg/dL      >=84 ==================================================================== Declared Medications:  The flagging and interpretation on this report are based on the  following declared medications.  Unexpected results may arise from  inaccuracies in the declared medications.   **Note: The testing scope of this panel includes these medications:   Oxycodone    **Note: The testing scope of this panel does not include the  following reported medications:   Omega-3 Fatty Acids  Pantoprazole  (Protonix )  Polyethylene Glycol (MiraLAX )  Prednisone  (Deltasone )  Rosuvastatin  (Crestor ) ==================================================================== For clinical consultation, please call [phone]. ====================================================================     No results found for: CBDTHCR No results found for: D8THCCBX No results found for: D9THCCBX  ROS  Constitutional: Denies any fever or chills Gastrointestinal: No reported hemesis,  hematochezia, vomiting, or acute GI distress Musculoskeletal: Denies any acute onset joint swelling, redness, loss of ROM, or weakness Neurological: No reported episodes of acute onset apraxia, aphasia, dysarthria, agnosia, amnesia, paralysis, loss of coordination, or loss of consciousness  Medication Review  AMBULATORY NON FORMULARY MEDICATION, Glucosamine-Chondroitin, Omega-3, acetaminophen , amLODipine , aspirin EC, calcium  carbonate, citalopram , donepezil , fentaNYL , gabapentin , hydrochlorothiazide , losartan , magnesium  oxide, melatonin, meloxicam , methocarbamol , metoprolol  succinate, mirabegron  ER, multivitamin, naloxone , oxyCODONE , pantoprazole , polyethylene glycol, predniSONE , and rosuvastatin   History Review  Allergy: Ms. Wenk is allergic to sulfa antibiotics and vesicare [solifenacin]. Drug: Ms. Mccaig  reports current drug use. Alcohol:  reports no history of alcohol use. Tobacco:  reports that she has never smoked. She has never been exposed to tobacco smoke. She has never used smokeless tobacco. Social: Ms. Morin  reports that she has never smoked. She has never been exposed to tobacco smoke. She has never used smokeless tobacco. She reports current drug use. She reports that she does not drink alcohol. Medical:  has a past medical history of Acute postoperative pain (10/25/2017), Anemia, Arm pain (07/26/2015), Arthritis, Arthritis, degenerative (03/26/2014), Back pain (11/01/2013), Bladder infection (06/2018), Breast cancer (HCC), Breast cancer (HCC), CHEST PAIN (04/29/2010), Chronic cystitis, Cystocele (02/22/2013), Cystocele, midline (08/19/2013), Degeneration of intervertebral disc of lumbosacral region (03/26/2014), DYSPNEA (04/29/2010), Enthesopathy of hip (03/26/2014), GERD (gastroesophageal reflux disease), Hiatal hernia, HTN (hypertension), Hypokalemia (06/2018), Hyponatremia (06/2018), LBP (low back pain) (03/26/2014), Neck pain (11/01/2013), Parkinson disease (HCC), Pneumonia (06/2018), Sinusitis  (02/07/2015), Skin lesions (07/12/2014), and Urinary incontinence. Surgical: Ms. Hisle  has a past surgical history that includes Tonsillectomy and adenoidectomy (1979); Vesicovaginal fistula closure w/ TAH (1983); Breast surgery (1986); Breast enhancement surgery (1987); Breast implant removal; Breast implant removal (Right, 08/29/2012); Mastectomy (08/2012); Abdominal hysterectomy; Colonoscopy with propofol  (N/A, 09/13/2016); Breast biopsy (2013); and Blepharoplasty. Family: family history includes Colon polyps in her father; Diabetes in her father; Parkinson's disease in her mother; Stroke in her father and mother.  Laboratory Chemistry Profile   Renal Lab Results  Component Value Date   BUN 43 (H) 04/24/2024   CREATININE 0.96 04/24/2024   BCR 38 (H) 04/23/2024   GFR 46.06 (L) 02/18/2024   GFRAA >60 07/27/2020   GFRNONAA 58 (L) 04/24/2024    Hepatic Lab Results  Component Value Date   AST 22 04/24/2024  ALT 20 04/24/2024   ALBUMIN 3.1 (L) 04/24/2024   ALKPHOS 26 (L) 04/24/2024    Electrolytes Lab Results  Component Value Date   NA 132 (L) 04/24/2024   K 3.7 04/24/2024   CL 98 04/24/2024   CALCIUM  8.8 (L) 04/24/2024   MG 2.4 (H) 04/23/2024    Bone Lab Results  Component Value Date   VD25OH 66.95 09/26/2022   25OHVITD1 61 04/23/2024   25OHVITD2 <1.0 04/23/2024   25OHVITD3 60 04/23/2024    Inflammation (CRP: Acute Phase) (ESR: Chronic Phase) Lab Results  Component Value Date   CRP 27 (H) 04/23/2024   ESRSEDRATE 15 04/23/2024         Note: Above Lab results reviewed.  Recent Imaging Review  CT ABDOMEN PELVIS W CONTRAST CLINICAL DATA:  Sharp right lower quadrant pain associated with low blood pressure and dehydration. Generalized weakness. Evaluate for intra-abdominal infection.  EXAM: CT ABDOMEN AND PELVIS WITH CONTRAST  TECHNIQUE: Multidetector CT imaging of the abdomen and pelvis was performed using the standard protocol following bolus administration  of intravenous contrast.  RADIATION DOSE REDUCTION: This exam was performed according to the departmental dose-optimization program which includes automated exposure control, adjustment of the mA and/or kV according to patient size and/or use of iterative reconstruction technique.  CONTRAST:  OMNIPAQUE  IOHEXOL  300 MG/ML  SOLN  COMPARISON:  CT abdomen pelvis 02/23/2012  FINDINGS: Lower chest: No acute abnormality.  Hepatobiliary: No focal liver abnormality is seen. No gallstones, gallbladder wall thickening, or biliary dilatation.  Pancreas: Unremarkable.  Spleen: Unremarkable.  Adrenals/Urinary Tract: Normal adrenal glands. Bilateral cortical renal scarring. No obstructing urinary calculi or hydronephrosis. Unremarkable bladder.  Stomach/Bowel: Stomach is within normal limits. No bowel obstruction. Normal appendix. Extensive sigmoid diverticulosis. Question mild wall thickening about the sigmoid colon versus smooth muscle hypertrophy. No adjacent fluid or abscess.  Vascular/Lymphatic: Aortic atherosclerosis. No enlarged abdominal or pelvic lymph nodes.  Reproductive: Hysterectomy.  No adnexal mass.  Other: No free intraperitoneal fluid or air.  Musculoskeletal: No acute fracture. Sclerosis in the right femoral head suspicious for AVN.  IMPRESSION: 1. Extensive sigmoid diverticulosis. Question mild sigmoid diverticulitis versus smooth muscle hypertrophy. No adjacent fluid or abscess. Otherwise no evidence of intra-abdominal infection. 2. Aortic Atherosclerosis (ICD10-I70.0).  Electronically Signed   By: Rozell Cornet M.D.   On: 04/24/2024 22:47 Note: Reviewed         Physical Exam  General appearance: Well nourished, well developed, and well hydrated. In no apparent acute distress Mental status: Alert, oriented x 3 (person, place, & time)       Respiratory: No evidence of acute respiratory distress Eyes: PERLA Vitals: BP 104/71   Temp (!) 97 F (36.1  C)   Resp 16   Ht 5' 3 (1.6 m)   Wt 152 lb (68.9 kg)   LMP 12/18/1981   SpO2 100%   BMI 26.93 kg/m  BMI: Estimated body mass index is 26.93 kg/m as calculated from the following:   Height as of this encounter: 5' 3 (1.6 m).   Weight as of this encounter: 152 lb (68.9 kg). Ideal: Ideal body weight: 52.4 kg (115 lb 8.3 oz) Adjusted ideal body weight: 59 kg (130 lb 1.8 oz)  Assessment   Diagnosis Status  1. Chronic right shoulder pain   2. Chronic upper back pain   3. Chronic pain of right knee   4. Chronic low back pain (1ry area of Pain) (Bilateral) (R>L) w/o sciatica   5. 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 Hoffa's pad syndrome (HCC) (Right)   7. Medial knee pain (Right)   8. Tricompartment osteoarthritis of knee (Right)   9. Osteoarthritis of knee (Right)   10. Chronic pain syndrome   11. Pharmacologic therapy   12. Chronic use of opiate for therapeutic purpose   13. Encounter for medication management   14. Encounter for chronic pain management   15. Unspecified injury of muscle(s) and tendon(s) of the rotator cuff of right shoulder, sequela   16. Facet syndrome, lumbar    Controlled Controlled Controlled   Updated Problems: No problems updated.  Plan of Care  Problem-specific:  Assessment and Plan    Right knee pain   Chronic right knee pain is her most severe issue, causing significant discomfort and requiring prioritized treatment. A genicular nerve block is proposed to assess relief. If successful, a CT scan followed by radiofrequency ablation may offer longer-term benefit. Perform a genicular nerve block to evaluate relief. If effective, proceed with a CT scan and consider radiofrequency ablation.  Chronic pain management   She currently uses fentanyl  patches, which are less effective than previous oxycodone . With two patches remaining, she prefers to use them before switching back to oxycodone , denying any side effects from oxycodone . Use the  remaining patches to avoid waste, then transition back to oxycodone . Send a prescription for oxycodone  to be available after the patches are used, approximately by June 14, 2024.       Ms. Cambrey Lupi has a current medication list which includes the following long-term medication(s): amlodipine , calcium  carbonate, citalopram , citalopram , donepezil , fentanyl , gabapentin , hydrochlorothiazide , losartan , magnesium  oxide, metoprolol  succinate, mirabegron  er, oxycodone , pantoprazole , rosuvastatin , and [START ON 06/04/2024] oxycodone .  Pharmacotherapy (Medications Ordered): Meds ordered this encounter  Medications   oxyCODONE  (OXY IR/ROXICODONE ) 5 MG immediate release tablet    Sig: Take 1 tablet (5 mg total) by mouth every 6 (six) hours as needed for severe pain (pain score 7-10) or breakthrough pain (PRN for post-radiofrequency pain). Must last 30 days.    Dispense:  120 tablet    Refill:  0    DO NOT: delete (not duplicate); no partial-fill (will deny script to complete), no refill request (F/U required). DISPENSE: 1 day early if closed on fill date. WARN: No CNS-depressants within 8 hrs of med.   Orders:  Orders Placed This Encounter  Procedures   GENICULAR NERVE BLOCK    Indication(s):  Sub-acute knee pain    Standing Status:   Future    Expiration Date:   08/28/2024    Scheduling Instructions:     Side: Right-sided     Sedation: Patient's choice.     Timeframe: ASAP    Where will this procedure be performed?:   [facility]   Nursing Instructions:    Please complete this patient's postprocedure evaluation.    Scheduling Instructions:     Please complete this patient's postprocedure evaluation.     Interventional Therapies  Risk Factors  Considerations  Medical Comorbidities:  Parkinson's disease  Hx right breast cancer  OIC  SOB  CKD Stage 3b  GERD  depression  osteopenia/osteoporosis  PSVT  Memory impairment      Planned  Pending:   No further  interventional therapies or steroids until after 06/17/2024.  Today we will conduct a workup to see if the patient is having adrenal insufficiency.   Under consideration:  Therapeutic right suprascapular nerve RFA #1  Possible intrathecal pump trial and permanent implant  Completed:   Diagnostic left suprascapular NB x1 (04/17/2024) (10-0/10)  Diagnostic left infraspinatus TPI/MNB x1 (04/01/2024) (0/0/0/0)  Therapeutic bilateral lumbar facet Blk x15 (03/20/2024)  Therapeutic bilateral lumbar facet RFA x R4L2  Diagnostic/therapeutic right suprascapular NB x2  Therapeutic bilateral trapezius/thoracic TPI x R6L1  Therapeutic right lumbar TPI x1  Therapeutic bilateral SI inj. x R3L2  Therapeutic right SI RFA x4  Therapeutic right knee inj. (steroid x5) (Zilretta  x3) (Monovisc x1) (pes anserine x2) (12/06/2023)  Therapeutic TFESI (bilateral: L5, L2 x1) (right: L5 x4, L4 x1, L3 x1)  Therapeutic LESI (midline: L2-3 x1) (right: L3-4 x2, L4-5 x2, L5-S1 x2)    Therapeutic  Palliative (PRN) options:   Do not schedule any procedures without a proper preprocedure evaluation.   Completed by other providers:   Therapeutic right knee (ORTHOVISC) inj. x3 (05/18/2023, 05/23/2023, 05/30/2023) by Mickeal Aland, MD [facility]) Therapeutic left knee (Kenalog ) inj. x1 (08/25/2020) by Candise Chambers, MD ([facility])   Pharmacotherapy  Nonopioids transfer 12/21/2020: Gabapentin       Return for [facility]): (R) Genicular NB #1, (F2F), (MM).    Recent Visits Date Type Provider Dept  05/28/24 Office Visit Renaldo Caroli, MD [facility]  05/07/24 Office Visit Renaldo Caroli, MD [facility]  04/23/24 Office Visit Renaldo Caroli, MD [facility]  04/17/24 Procedure visit Renaldo Caroli, MD [facility]  04/01/24 Office Visit Renaldo Caroli, MD [facility]  03/26/24 Office Visit Renaldo Caroli, MD [facility]  03/20/24  Procedure visit Renaldo Caroli, MD [facility]  Showing recent visits within past 90 days and meeting all other requirements Future Appointments Date Type Provider Dept  06/10/24 Appointment Renaldo Caroli, MD [facility]  06/18/24 Appointment Patel, Seema K, NP [facility]  Showing future appointments within next 90 days and meeting all other requirements  I discussed the assessment and treatment plan with the patient. The patient was provided an opportunity to ask questions and all were answered. The patient agreed with the plan and demonstrated an understanding of the instructions.  Patient advised to call back or seek an in-person evaluation if the symptoms or condition worsens.  Duration of encounter: 35 minutes.  Total time on encounter, as per AMA guidelines included both the face-to-face and non-face-to-face time personally spent by the physician and/or other qualified health care professional(s) on the day of the encounter (includes time in activities that require the physician or other qualified health care professional and does not include time in activities normally performed by clinical staff). Physician's time may include the following activities when performed: Preparing to see the patient (e.g., pre-charting review of records, searching for previously ordered imaging, lab work, and nerve conduction tests) Review of prior analgesic pharmacotherapies. Reviewing PMP Interpreting ordered tests (e.g., lab work, imaging, nerve conduction tests) Performing post-procedure evaluations, including interpretation of diagnostic procedures Obtaining and/or reviewing separately obtained history Performing a medically appropriate examination and/or evaluation Counseling and educating the patient/family/caregiver Ordering medications, tests, or procedures Referring and communicating with other health care professionals (when not separately  reported) Documenting clinical information in the electronic or other health record Independently interpreting results (not separately reported) and communicating results to the patient/ family/caregiver Care coordination (not separately reported)  Note by: Candi Chafe, MD (TTS and AI technology used. I apologize for any typographical errors that were not detected and corrected.) Date: 05/28/2024; Time: 8:01 AM

## 2024-05-28 ENCOUNTER — Encounter: Payer: Self-pay | Admitting: Pain Medicine

## 2024-05-28 ENCOUNTER — Ambulatory Visit: Attending: Pain Medicine | Admitting: Pain Medicine

## 2024-05-28 VITALS — BP 104/71 | Temp 97.0°F | Resp 16 | Ht 63.0 in | Wt 152.0 lb

## 2024-05-28 DIAGNOSIS — G894 Chronic pain syndrome: Secondary | ICD-10-CM | POA: Insufficient documentation

## 2024-05-28 DIAGNOSIS — M25511 Pain in right shoulder: Secondary | ICD-10-CM | POA: Insufficient documentation

## 2024-05-28 DIAGNOSIS — M545 Low back pain, unspecified: Secondary | ICD-10-CM | POA: Diagnosis not present

## 2024-05-28 DIAGNOSIS — S46001S Unspecified injury of muscle(s) and tendon(s) of the rotator cuff of right shoulder, sequela: Secondary | ICD-10-CM | POA: Insufficient documentation

## 2024-05-28 DIAGNOSIS — M1711 Unilateral primary osteoarthritis, right knee: Secondary | ICD-10-CM | POA: Insufficient documentation

## 2024-05-28 DIAGNOSIS — Z79891 Long term (current) use of opiate analgesic: Secondary | ICD-10-CM | POA: Diagnosis not present

## 2024-05-28 DIAGNOSIS — E8889 Other specified metabolic disorders: Secondary | ICD-10-CM | POA: Insufficient documentation

## 2024-05-28 DIAGNOSIS — M25561 Pain in right knee: Secondary | ICD-10-CM | POA: Insufficient documentation

## 2024-05-28 DIAGNOSIS — M549 Dorsalgia, unspecified: Secondary | ICD-10-CM | POA: Insufficient documentation

## 2024-05-28 DIAGNOSIS — M47816 Spondylosis without myelopathy or radiculopathy, lumbar region: Secondary | ICD-10-CM | POA: Diagnosis not present

## 2024-05-28 DIAGNOSIS — G8929 Other chronic pain: Secondary | ICD-10-CM | POA: Insufficient documentation

## 2024-05-28 DIAGNOSIS — Z79899 Other long term (current) drug therapy: Secondary | ICD-10-CM | POA: Diagnosis not present

## 2024-05-28 MED ORDER — OXYCODONE HCL 5 MG PO TABS: 5.0000 mg | ORAL_TABLET | Freq: Four times a day (QID) | ORAL | 0 refills | Status: AC | PRN

## 2024-05-28 NOTE — Patient Instructions (Signed)

## 2024-05-28 NOTE — Progress Notes (Signed)
 Nursing Pain Medication Assessment:  Safety precautions to be maintained throughout the outpatient stay will include: orient to surroundings, keep bed in low position, maintain call bell within reach at all times, provide assistance with transfer out of bed and ambulation.  Medication Inspection Compliance: Pill count conducted under aseptic conditions, in front of the patient. Neither the pills nor the bottle was removed from the patient's sight at any time. Once count was completed pills were immediately returned to the patient in their original bottle.  Medication: Oxycodone  IR Pill/Patch Count: 0 of 120 pills/patches remain Pill/Patch Appearance: Markings consistent with prescribed medication Bottle Appearance: Standard pharmacy container. Clearly labeled. Filled Date: 4 / 64 / 2025 Last Medication intake:  Today  Patient states she has one pill at home. In pill box.  Patient did not bring box of patches with her today. Patient and husband states she has two patches at home and is currently wearing here.

## 2024-05-29 ENCOUNTER — Telehealth: Payer: Self-pay

## 2024-05-29 DIAGNOSIS — G8929 Other chronic pain: Secondary | ICD-10-CM | POA: Diagnosis not present

## 2024-05-29 DIAGNOSIS — M25511 Pain in right shoulder: Secondary | ICD-10-CM | POA: Diagnosis not present

## 2024-05-29 DIAGNOSIS — M7062 Trochanteric bursitis, left hip: Secondary | ICD-10-CM | POA: Diagnosis not present

## 2024-05-29 DIAGNOSIS — E871 Hypo-osmolality and hyponatremia: Secondary | ICD-10-CM | POA: Diagnosis not present

## 2024-05-29 DIAGNOSIS — D649 Anemia, unspecified: Secondary | ICD-10-CM | POA: Diagnosis not present

## 2024-05-29 DIAGNOSIS — K5900 Constipation, unspecified: Secondary | ICD-10-CM | POA: Diagnosis not present

## 2024-05-29 DIAGNOSIS — M461 Sacroiliitis, not elsewhere classified: Secondary | ICD-10-CM | POA: Diagnosis not present

## 2024-05-29 DIAGNOSIS — I10 Essential (primary) hypertension: Secondary | ICD-10-CM | POA: Diagnosis not present

## 2024-05-29 DIAGNOSIS — Z853 Personal history of malignant neoplasm of breast: Secondary | ICD-10-CM | POA: Diagnosis not present

## 2024-05-29 DIAGNOSIS — M545 Low back pain, unspecified: Secondary | ICD-10-CM | POA: Diagnosis not present

## 2024-05-29 DIAGNOSIS — M25561 Pain in right knee: Secondary | ICD-10-CM | POA: Diagnosis not present

## 2024-05-29 DIAGNOSIS — Z9181 History of falling: Secondary | ICD-10-CM | POA: Diagnosis not present

## 2024-05-29 NOTE — Telephone Encounter (Signed)
 Copied from CRM [id number]. Topic: General - Other >> May 29, 2024  3:10 PM Felizardo Hotter wrote: Reason for CRM: Pt's son Fiana Gladu ph:[phone] needs someone to call him back regarding providing palliative or hospice care for pt. Please call Jenet Miss.

## 2024-05-30 ENCOUNTER — Inpatient Hospital Stay

## 2024-05-30 ENCOUNTER — Other Ambulatory Visit: Payer: Self-pay | Admitting: Internal Medicine

## 2024-05-30 ENCOUNTER — Inpatient Hospital Stay: Attending: Oncology

## 2024-05-30 DIAGNOSIS — Z853 Personal history of malignant neoplasm of breast: Secondary | ICD-10-CM | POA: Insufficient documentation

## 2024-05-30 DIAGNOSIS — D649 Anemia, unspecified: Secondary | ICD-10-CM | POA: Diagnosis not present

## 2024-05-30 LAB — CBC WITH DIFFERENTIAL/PLATELET
Abs Immature Granulocytes: 0.04 10*3/uL (ref 0.00–0.07)
Basophils Absolute: 0.1 10*3/uL (ref 0.0–0.1)
Basophils Relative: 1 %
Eosinophils Absolute: 0.1 10*3/uL (ref 0.0–0.5)
Eosinophils Relative: 1 %
HCT: 34.5 % — ABNORMAL LOW (ref 36.0–46.0)
Hemoglobin: 11.1 g/dL — ABNORMAL LOW (ref 12.0–15.0)
Immature Granulocytes: 1 %
Lymphocytes Relative: 23 %
Lymphs Abs: 1.8 10*3/uL (ref 0.7–4.0)
MCH: 30.6 pg (ref 26.0–34.0)
MCHC: 32.2 g/dL (ref 30.0–36.0)
MCV: 95 fL (ref 80.0–100.0)
Monocytes Absolute: 0.6 10*3/uL (ref 0.1–1.0)
Monocytes Relative: 7 %
Neutro Abs: 5.3 10*3/uL (ref 1.7–7.7)
Neutrophils Relative %: 67 %
Platelets: 243 10*3/uL (ref 150–400)
RBC: 3.63 MIL/uL — ABNORMAL LOW (ref 3.87–5.11)
RDW: 14 % (ref 11.5–15.5)
WBC: 7.8 10*3/uL (ref 4.0–10.5)
nRBC: 0 % (ref 0.0–0.2)

## 2024-05-30 NOTE — Telephone Encounter (Signed)
 Called and spoke with son and advised that palliative care referral was closed again. They are wanting someone to come out more than once a month and limit doctor visits, etc. Discussed hospice, doctors making house calls and equity [facility]. Son is going to do some research and then let me know how he would like to proceed. Explained if she decides to be followed with Doctors making house calls, Dr Geralyn Knee would no longer be able to follow as PCP. Son gave verbal understanding and said he would discuss with his mom.

## 2024-05-30 NOTE — Progress Notes (Signed)
 Hgb 11.1 today, no Retacrit  inj given

## 2024-05-30 NOTE — Telephone Encounter (Signed)
Rx ok'd for mag oxide

## 2024-06-03 ENCOUNTER — Ambulatory Visit (INDEPENDENT_AMBULATORY_CARE_PROVIDER_SITE_OTHER): Admitting: Internal Medicine

## 2024-06-03 DIAGNOSIS — M810 Age-related osteoporosis without current pathological fracture: Secondary | ICD-10-CM

## 2024-06-03 NOTE — Progress Notes (Unsigned)
 Subjective:    Patient ID: Wanda Martinez, female    DOB: 1935-08-06, 89 y.o.   MRN: [id number]  Patient here for No chief complaint on file.   HPI Here for a scheduled follow up -  follow up regarding anemia, CKD, hypercholesterolemia, hypertension and hyperglycemia. Being followed by pain clinic - chronic pain. Using fentanyl  patches with plans to transition back to oxycodone . For her right knee pain - recommended genicular nerve block. Last evaluated 05/28/24. F/u with Dr Milta Alosa 05/21/24 - f/u CKD. Stable. Has been seeing Dr Adrian Alba for f/u anemia and receiving zometa  - for osteoporosis. S/p 5 years of letrozole  in 04/2018 and is s/p bilateral mastectomies.    Past Medical History:  Diagnosis Date   Acute postoperative pain 10/25/2017   Anemia    Arm pain 07/26/2015   Arthritis    Arthritis, degenerative 03/26/2014   Back pain 11/01/2013   Bladder infection 06/2018   Breast cancer (HCC)    Masectomy - left - 1986    Breast cancer [facility])    Mastectomy-right -2014   CHEST PAIN 04/29/2010   Qualifier: Diagnosis of  By: Lincoln Renshaw RN, Erika     Chronic cystitis    Cystocele 02/22/2013   Cystocele, midline 08/19/2013   Degeneration of intervertebral disc of lumbosacral region 03/26/2014   DYSPNEA 04/29/2010   Qualifier: Diagnosis of  By: Lincoln Renshaw RN, Erika     Enthesopathy of hip 03/26/2014   GERD (gastroesophageal reflux disease)    Hiatal hernia    HTN (hypertension)    Hypokalemia 06/2018   Hyponatremia 06/2018   LBP (low back pain) 03/26/2014   Neck pain 11/01/2013   Parkinson disease (HCC)    Pneumonia 06/2018   Sinusitis 02/07/2015   Skin lesions 07/12/2014   Urinary incontinence    mixed    Past Surgical History:  Procedure Laterality Date   ABDOMINAL HYSTERECTOMY     BLEPHAROPLASTY     BREAST BIOPSY  2013   BREAST ENHANCEMENT SURGERY  1987   BREAST IMPLANT REMOVAL     BREAST IMPLANT REMOVAL Right 08/29/2012   BREAST SURGERY  1986   s/p left mastectomy   COLONOSCOPY WITH  PROPOFOL  N/A 09/13/2016   Procedure: COLONOSCOPY WITH PROPOFOL ;  Surgeon: Cassie Click, MD;  Location: [facility] ENDOSCOPY;  Service: Endoscopy;  Laterality: N/A;   MASTECTOMY  08/2012   right   TONSILLECTOMY AND ADENOIDECTOMY  1979   VESICOVAGINAL FISTULA CLOSURE W/ TAH  1983   Family History  Problem Relation Age of Onset   Diabetes Father    Stroke Father    Colon polyps Father    Stroke Mother    Parkinson's disease Mother    Social History   Socioeconomic History   Marital status: Married    Spouse name: Not on file   Number of children: 3   Years of education: Not on file   Highest education level: Not on file  Occupational History   Not on file  Tobacco Use   Smoking status: Never    Passive exposure: Never   Smokeless tobacco: Never   Tobacco comments:    tobacco use - no  Vaping Use   Vaping status: Never Used  Substance and Sexual Activity   Alcohol use: No    Alcohol/week: 0.0 standard drinks of alcohol   Drug use: Yes    Comment: prescribed   Sexual activity: Never  Other Topics Concern   Not on file  Social History Narrative  married   Social Drivers of Corporate investment banker Strain: Low Risk  (01/16/2024)   Overall Financial Resource Strain (CARDIA)    Difficulty of Paying Living Expenses: Not hard at all  Food Insecurity: No Food Insecurity (03/17/2024)   Hunger Vital Sign    Worried About Running Out of Food in the Last Year: Never true    Ran Out of Food in the Last Year: Never true  Transportation Needs: No Transportation Needs (03/17/2024)   PRAPARE - Administrator, Civil Service (Medical): No    Lack of Transportation (Non-Medical): No  Physical Activity: Inactive (01/16/2024)   Exercise Vital Sign    Days of Exercise per Week: 0 days    Minutes of Exercise per Session: 0 min  Stress: No Stress Concern Present (01/16/2024)   [employer] of Occupational Health - Occupational Stress Questionnaire    Feeling of Stress :  Only a little  Social Connections: Moderately Integrated (03/13/2024)   Social Connection and Isolation Panel    Frequency of Communication with Friends and Family: More than three times a week    Frequency of Social Gatherings with Friends and Family: More than three times a week    Attends Religious Services: More than 4 times per year    Active Member of [employer] or Organizations: No    Attends Banker Meetings: Never    Marital Status: Married     Review of Systems     Objective:     LMP 12/18/1981  Wt Readings from Last 3 Encounters:  05/28/24 152 lb (68.9 kg)  05/07/24 155 lb (70.3 kg)  04/24/24 157 lb (71.2 kg)    Physical Exam  {Perform Simple Foot Exam  Perform Detailed exam:1} {Insert foot Exam (Optional):30965}   Outpatient Encounter Medications as of 06/03/2024  Medication Sig   acetaminophen  (TYLENOL ) 325 MG tablet Take 2 tablets (650 mg total) by mouth every 6 (six) hours as needed for mild pain (pain score 1-3) (or Fever >/= 101).   AMBULATORY NON FORMULARY MEDICATION Medication Name: .   amLODipine  (NORVASC ) 5 MG tablet Take 1 tablet (5 mg total) by mouth daily.   aspirin 81 MG EC tablet Take 81 mg by mouth daily as needed.   calcium  carbonate (OSCAL) 1500 (600 Ca) MG TABS tablet Take 1 tablet by mouth daily.   citalopram  (CELEXA ) 10 MG tablet Take 10 mg by mouth daily. Take long with one 20 mg tablet for total 30 mg once daily   citalopram  (CELEXA ) 20 MG tablet TAKE 1 TABLET BY MOUTH ONCE DAILY. (Patient taking differently: Take 20 mg by mouth daily. Take along with one 10 mg tablet for total 30 mg once daily)   donepezil  (ARICEPT ) 10 MG tablet TAKE ONE TABLET BY MOUTH AT BEDTIME   fentaNYL  (DURAGESIC ) 12 MCG/HR Place 1 patch onto the skin every 3 (three) days. Must last 30 days.   gabapentin  (NEURONTIN ) 100 MG capsule Take 2 capsules (200 mg total) by mouth 3 (three) times daily.   Glucosamine-Chondroitin 500-400 MG CAPS Take 2 capsules by mouth  daily.   hydrochlorothiazide  (HYDRODIURIL ) 12.5 MG tablet Take 1 tablet (12.5 mg total) by mouth every morning.   losartan  (COZAAR ) 50 MG tablet TAKE ONE TABLET BY MOUTH ONCE DAILY   magnesium  oxide (MAG-OX) 400 (240 Mg) MG tablet TAKE ONE TABLET BY MOUTH EVERY DAY   melatonin 1 MG TABS tablet Take 3 mg by mouth at bedtime.   meloxicam  (MOBIC ) 15  MG tablet Take 1 tablet (15 mg total) by mouth daily.   methocarbamol  (ROBAXIN ) 500 MG tablet Take 1 tablet (500 mg total) by mouth every 8 (eight) hours as needed for muscle spasms.   metoprolol  succinate (TOPROL -XL) 25 MG 24 hr tablet TAKE ONE TABLET BY MOUTH TWICE DAILY   mirabegron  ER (MYRBETRIQ ) 50 MG TB24 tablet Take 1 tablet (50 mg total) by mouth daily.   Multiple Vitamin (MULTIVITAMIN) tablet Take 1 tablet by mouth daily.   naloxone  (NARCAN ) nasal spray 4 mg/0.1 mL Place 1 spray into the nose as needed for up to 365 doses (for opioid-induced respiratory depresssion). In case of emergency (overdose), spray once into each nostril. If no response within 3 minutes, repeat application and call 911.   Omega-3 1000 MG CAPS Take by mouth.   oxyCODONE  (OXY IR/ROXICODONE ) 5 MG immediate release tablet Take 1 tablet (5 mg total) by mouth every 6 (six) hours as needed for severe pain (pain score 7-10) or breakthrough pain (PRN for post-radiofrequency pain). Must last 30 days.   [START ON 06/04/2024] oxyCODONE  (OXY IR/ROXICODONE ) 5 MG immediate release tablet Take 1 tablet (5 mg total) by mouth every 6 (six) hours as needed for severe pain (pain score 7-10) or breakthrough pain (PRN for post-radiofrequency pain). Must last 30 days.   pantoprazole  (PROTONIX ) 40 MG tablet TAKE ONE TABLET BY MOUTH EVERY DAY   polyethylene glycol (MIRALAX  / GLYCOLAX ) 17 g packet Take 17 g by mouth daily as needed for severe constipation.   predniSONE  (DELTASONE ) 10 MG tablet 4 tabs po day 1; 3 tabs po day 2,3; 2 tabs po day 4,5; 1 tab po day 6,7; 1/2 tab po day 8,9,10,11    rosuvastatin  (CRESTOR ) 20 MG tablet TAKE ONE TABLET BY MOUTH AT BEDTIME   No facility-administered encounter medications on file as of 06/03/2024.     Lab Results  Component Value Date   WBC 7.8 05/30/2024   HGB 11.1 (L) 05/30/2024   HCT 34.5 (L) 05/30/2024   PLT 243 05/30/2024   GLUCOSE 103 (H) 04/24/2024   CHOL 127 02/18/2024   TRIG 91.0 02/18/2024   HDL 49.30 02/18/2024   LDLDIRECT 73.0 07/09/2018   LDLCALC 59 02/18/2024   ALT 20 04/24/2024   AST 22 04/24/2024   NA 132 (L) 04/24/2024   K 3.7 04/24/2024   CL 98 04/24/2024   CREATININE 0.96 04/24/2024   BUN 43 (H) 04/24/2024   CO2 23 04/24/2024   TSH 2.86 09/11/2023   HGBA1C 5.7 02/18/2024    CT ABDOMEN PELVIS W CONTRAST Result Date: 04/24/2024 CLINICAL DATA:  [place] right lower quadrant pain associated with low blood pressure and dehydration. Generalized weakness. Evaluate for intra-abdominal infection. EXAM: CT ABDOMEN AND PELVIS WITH CONTRAST TECHNIQUE: Multidetector CT imaging of the abdomen and pelvis was performed using the standard protocol following bolus administration of intravenous contrast. RADIATION DOSE REDUCTION: This exam was performed according to the departmental dose-optimization program which includes automated exposure control, adjustment of the mA and/or kV according to patient size and/or use of iterative reconstruction technique. CONTRAST:  OMNIPAQUE  IOHEXOL  300 MG/ML  SOLN COMPARISON:  CT abdomen pelvis 02/23/2012 FINDINGS: Lower chest: No acute abnormality. Hepatobiliary: No focal liver abnormality is seen. No gallstones, gallbladder wall thickening, or biliary dilatation. Pancreas: Unremarkable. Spleen: Unremarkable. Adrenals/Urinary Tract: Normal adrenal glands. Bilateral cortical renal scarring. No obstructing urinary calculi or hydronephrosis. Unremarkable bladder. Stomach/Bowel: Stomach is within normal limits. No bowel obstruction. Normal appendix. Extensive sigmoid diverticulosis. Question mild wall  thickening about the sigmoid colon versus smooth muscle hypertrophy. No adjacent fluid or abscess. Vascular/Lymphatic: Aortic atherosclerosis. No enlarged abdominal or pelvic lymph nodes. Reproductive: Hysterectomy.  No adnexal mass. Other: No free intraperitoneal fluid or air. Musculoskeletal: No acute fracture. Sclerosis in the right femoral head suspicious for AVN. IMPRESSION: 1. Extensive sigmoid diverticulosis. Question mild sigmoid diverticulitis versus smooth muscle hypertrophy. No adjacent fluid or abscess. Otherwise no evidence of intra-abdominal infection. 2. Aortic Atherosclerosis (ICD10-I70.0). Electronically Signed   By: Rozell Cornet M.D.   On: 04/24/2024 22:47       Assessment & Plan:  There are no diagnoses linked to this encounter.   Dellar Fenton, MD

## 2024-06-06 ENCOUNTER — Telehealth: Payer: Self-pay

## 2024-06-06 NOTE — Telephone Encounter (Signed)
 Copied from CRM [id number]. Topic: Clinical - Home Health Verbal Orders >> Jun 06, 2024  2:37 PM Shereese L wrote: PATIENT MISSED A THERAPY SESSION ON 05/30/2024 AND THERAPIST WANTS TO REPORT IT

## 2024-06-08 ENCOUNTER — Encounter: Payer: Self-pay | Admitting: Internal Medicine

## 2024-06-08 NOTE — Progress Notes (Unsigned)
 PROVIDER NOTE: Interpretation of information contained herein should be left to medically-trained personnel. Specific patient instructions are provided elsewhere under Patient Instructions section of medical record. This document was created in part using STT-dictation technology, any transcriptional errors that may result from this process are unintentional.  Patient: Wanda Martinez Type: Established DOB: 1935-03-02 MRN: [id number] PCP: Glendia Shad, MD  Service: Procedure DOS: 06/10/2024 Setting: Ambulatory Location: Ambulatory outpatient facility Delivery: Face-to-face Provider: Eric DELENA Como, MD Specialty: Interventional Pain Management Specialty designation: 09 Location: Outpatient facility Ref. Prov.: Glendia Shad, MD       Interventional Therapy  Procedure:          Type: Genicular Nerves Block (Superolateral, Superomedial, and Inferomedial Genicular Nerves)  #1  Laterality: Right (-RT)  Level: Superior and inferior to the knee joint.  Imaging: Fluoroscopic guidance Anesthesia: Local anesthesia (1-2% Lidocaine ) Anxiolysis: IV Versed          Sedation: No Sedation                       DOS: 06/10/2024  Performed by: Eric DELENA Como, MD  Purpose: Diagnostic/Therapeutic Indications: Chronic knee pain severe enough to impact quality of life or function. Rationale (medical necessity): procedure needed and proper for the diagnosis and/or treatment of Wanda Martinez's medical symptoms and needs. 1. Chronic knee pain (Right)   2. Chronic patellofemoral pain of knee (Right)   3. Hoffa's fat pad disease (HCC) (Right)   4. Hoffa's pad syndrome (HCC) (Right)   5. Infrapatellar bursitis of knee (Right)   6. Medial knee pain (Right)   7. Osteoarthritis of knee (Right)   8. Pes anserinus bursitis of right knee    NAS-11 Pain score:   Pre-procedure: 9 /10   Post-procedure: 0-No pain/10     Target: For Genicular Nerve block(s), the targets are: the superolateral  genicular nerve, located in the lateral distal portion of the femoral shaft as it curves to form the lateral epicondyle, in the region of the distal femoral metaphysis; the superomedial genicular nerve, located in the medial distal portion of the femoral shaft as it curves to form the medial epicondyle; and the inferomedial genicular nerve, located in the medial, proximal portion of the tibial shaft, as it curves to form the medial epicondyle, in the region of the proximal tibial metaphysis.  Location: Superolateral, Superomedial, and Inferomedial aspects of knee joint.  Region: Lateral, Anterior, and Medial aspects of the knee joint, above and below the knee joint proper. Approach: Percutaneous  Type of procedure: Percutaneous perineural nerve block. The genicular nerve block is a motor-sparing technique that anesthetizes the sensory terminal branches innervating the knee joint, resulting in anesthesia of the anterior compartment of the knee. The distribution of anesthesia of each nerve is mostly in the corresponding quadrant.  Neuroanatomy: The superolateral genicular nerve (SLGN) courses around the femur shaft to pass between the vastus lateralis and the lateral epicondyle. It accompanies the superior lateral genicular artery. The superomedial genicular nerve (SMGN) courses around the femur shaft, following the superior medial genicular artery, to pass between the adductor magnus tendon and the medial epicondyle below the vastus medialis. The inferolateral genicular nerve (ILGN) courses around the tibial lateral epicondyle deep to the lateral collateral ligament, following the inferior lateral genicular artery, superior of the fibula head. The inferomedial genicular nerve (IMGN) courses horizontally below the medial collateral ligament between the tibial medial epicondyle and the insertion of the collateral ligament. It accompanies the inferior medial genicular artery. The  recurrent peroneal nerve  originates in the inferior popliteal region from the common peroneal nerve and courses horizontally around the fibula to pass just inferior of the fibula head and travel superior to the anterolateral tibial epicondyle. It accompanies the recurrent tibial artery.  Position / Prep / Materials:  Position: Supine, Modified Fowler's position with pillows under the targeted knee(s). The patient is placed in a supine position with the knee slightly flexed by placing a pillow in the popliteal fossa. Prep solution: ChloraPrep (2% chlorhexidine gluconate and 70% isopropyl alcohol) Prep Area: Entire knee area, from mid-thigh to mid-shin, lateral, anterior, and medial aspects. Materials:  Tray: Block Needle(s):  Type: Spinal  Gauge (G): 22  Length: 3.5-in  Qty: 3  H&P (Pre-op Assessment):  Wanda Martinez is a 89 y.o. (year old), female patient, seen today for interventional treatment. She  has a past surgical history that includes Tonsillectomy and adenoidectomy (1979); Vesicovaginal fistula closure w/ TAH (1983); Breast surgery (1986); Breast enhancement surgery (1987); Breast implant removal; Breast implant removal (Right, 08/29/2012); Mastectomy (08/2012); Abdominal hysterectomy; Colonoscopy with propofol  (N/A, 09/13/2016); Breast biopsy (2013); and Blepharoplasty. Wanda Martinez has a current medication list which includes the following prescription(s): acetaminophen , AMBULATORY NON FORMULARY MEDICATION, amlodipine , aspirin ec, calcium  carbonate, citalopram , citalopram , donepezil , fentanyl , gabapentin , glucosamine-chondroitin, hydrochlorothiazide , losartan , magnesium  oxide, melatonin, meloxicam , methocarbamol , metoprolol  succinate, mirabegron  er, multivitamin, naloxone , omega-3, oxycodone , oxycodone , pantoprazole , polyethylene glycol, rosuvastatin , fentanyl , and prednisone , and the following Facility-Administered Medications: pentafluoroprop-tetrafluoroeth. Her primarily concern today is the Knee Pain  (right)  Initial Vital Signs:  Pulse/HCG Rate: 70ECG Heart Rate: 78 Temp: (!) 97.2 F (36.2 C) Resp: 16 BP: 101/75 SpO2: 98 %  BMI: Estimated body mass index is 26.93 kg/m as calculated from the following:   Height as of this encounter: 5' 3 (1.6 m).   Weight as of this encounter: 152 lb (68.9 kg).  Risk Assessment: Allergies: Reviewed. She is allergic to sulfa antibiotics and vesicare [solifenacin].  Allergy Precautions: None required Coagulopathies: Reviewed. None identified.  Blood-thinner therapy: None at this time Active Infection(s): Reviewed. None identified. Ms. Mullane is afebrile  Site Confirmation: Ms. Uhrich was asked to confirm the procedure and laterality before marking the site Procedure checklist: Completed Consent: Before the procedure and under the influence of no sedative(s), amnesic(s), or anxiolytics, the patient was informed of the treatment options, risks and possible complications. To fulfill our ethical and legal obligations, as recommended by the American Medical Association's Code of Ethics, I have informed the patient of my clinical impression; the nature and purpose of the treatment or procedure; the risks, benefits, and possible complications of the intervention; the alternatives, including doing nothing; the risk(s) and benefit(s) of the alternative treatment(s) or procedure(s); and the risk(s) and benefit(s) of doing nothing. The patient was provided information about the general risks and possible complications associated with the procedure. These may include, but are not limited to: failure to achieve desired goals, infection, bleeding, organ or nerve damage, allergic reactions, paralysis, and death. In addition, the patient was informed of those risks and complications associated to the procedure, such as failure to decrease pain; infection; bleeding; organ or nerve damage with subsequent damage to sensory, motor, and/or autonomic systems, resulting in  permanent pain, numbness, and/or weakness of one or several areas of the body; allergic reactions; (i.e.: anaphylactic reaction); and/or death. Furthermore, the patient was informed of those risks and complications associated with the medications. These include, but are not limited to: allergic reactions (i.e.: anaphylactic or anaphylactoid reaction(s)); adrenal axis  suppression; blood sugar elevation that in diabetics may result in ketoacidosis or comma; water retention that in patients with history of congestive heart failure may result in shortness of breath, pulmonary edema, and decompensation with resultant heart failure; weight gain; swelling or edema; medication-induced neural toxicity; particulate matter embolism and blood vessel occlusion with resultant organ, and/or nervous system infarction; and/or aseptic necrosis of one or more joints. Finally, the patient was informed that Medicine is not an exact science; therefore, there is also the possibility of unforeseen or unpredictable risks and/or possible complications that may result in a catastrophic outcome. The patient indicated having understood very clearly. We have given the patient no guarantees and we have made no promises. Enough time was given to the patient to ask questions, all of which were answered to the patient's satisfaction. Ms. Gerdts has indicated that she wanted to continue with the procedure. Attestation: I, the ordering provider, attest that I have discussed with the patient the benefits, risks, side-effects, alternatives, likelihood of achieving goals, and potential problems during recovery for the procedure that I have provided informed consent. Date  Time: 06/10/2024  9:17 AM  Pre-Procedure Preparation:  Monitoring: As per clinic protocol. Respiration, ETCO2, SpO2, BP, heart rate and rhythm monitor placed and checked for adequate function Safety Precautions: Patient was assessed for positional comfort and pressure points  before starting the procedure. Time-out: I initiated and conducted the Time-out before starting the procedure, as per protocol. The patient was asked to participate by confirming the accuracy of the Time Out information. Verification of the correct person, site, and procedure were performed and confirmed by me, the nursing staff, and the patient. Time-out conducted as per Joint Commission's Universal Protocol (UP.01.01.01). Time: 0938 Start Time: 0938 hrs.  Description/Narrative of Procedure:          Rationale (medical necessity): procedure needed and proper for the diagnosis and/or treatment of the patient's medical symptoms and needs. Procedural Technique Safety Precautions: Aspiration looking for blood return was conducted prior to all injections. At no point did we inject any substances, as a needle was being advanced. No attempts were made at seeking any paresthesias. Safe injection practices and needle disposal techniques used. Medications properly checked for expiration dates. SDV (single dose vial) medications used. Description of the Procedure: Protocol guidelines were followed. The patient was assisted into a comfortable position. The target area was identified and the area prepped in the usual manner. Skin & deeper tissues infiltrated with local anesthetic. Appropriate amount of time allowed to pass for local anesthetics to take effect. The procedure needles were then advanced to the target area. Proper needle placement secured. Negative aspiration confirmed. Solution injected in intermittent fashion, asking for systemic symptoms every 0.5cc of injectate. The needles were then removed and the area cleansed, making sure to leave some of the prepping solution back to take advantage of its long term bactericidal properties.             Vitals:   06/10/24 0941 06/10/24 0945 06/10/24 0948 06/10/24 0954  BP: (!) 153/76 119/70 127/64 103/62  Pulse:      Resp: 15 14 14    Temp:       SpO2: 98% 98% 97% 100%  Weight:      Height:         Start Time: 0938 hrs. End Time: 0946 hrs.  Imaging Guidance (Non-Spinal):          Type of Imaging Technique: Fluoroscopy Guidance (Non-Spinal) Indication(s): Fluoroscopy guidance for needle  placement to enhance accuracy in procedures requiring precise needle localization for targeted delivery of medication in or near specific anatomical locations not easily accessible without such real-time imaging assistance. Exposure Time: Please see nurses notes. Contrast: None used. Fluoroscopic Guidance: I was personally present during the use of fluoroscopy. Tunnel Vision Technique used to obtain the best possible view of the target area. Parallax error corrected before commencing the procedure. Direction-depth-direction technique used to introduce the needle under continuous pulsed fluoroscopy. Once target was reached, antero-posterior, oblique, and lateral fluoroscopic projection used confirm needle placement in all planes. Images permanently stored in EMR. Interpretation: No contrast injected. I personally interpreted the imaging intraoperatively. Adequate needle placement confirmed in multiple planes. Permanent images saved into the patient's record.  Post-operative Assessment:  Post-procedure Vital Signs:  Pulse/HCG Rate: 7072 Temp: (!) 97.2 F (36.2 C) Resp: 14 BP: 103/62 SpO2: 100 %  EBL: None  Complications: No immediate post-treatment complications observed by team, or reported by patient.  Note: The patient tolerated the entire procedure well. A repeat set of vitals were taken after the procedure and the patient was kept under observation following institutional policy, for this type of procedure. Post-procedural neurological assessment was performed, showing return to baseline, prior to discharge. The patient was provided with post-procedure discharge instructions, including a section on how to identify potential problems.  Should any problems arise concerning this procedure, the patient was given instructions to immediately contact us , at any time, without hesitation. In any case, we plan to contact the patient by telephone for a follow-up status report regarding this interventional procedure.  Comments:  No additional relevant information.  Plan of Care (POC)  Orders:  Orders Placed This Encounter  Procedures   GENICULAR NERVE BLOCK    Scheduling Instructions:     Side(s): Right Knee     Level(s): Superior-Lateral, Superior-Medial, and Inferior-Medial Genicular Nerves     Sedation: Patient's choice.     Date: 06/10/2024    Where will this procedure be performed?:   [facility]   [facility] C-ARM 1-60 MIN NO REPORT    Intraoperative interpretation by procedural physician at [facility].    Standing Status:   Standing    Number of Occurrences:   1    Reason for exam::   Assistance in needle guidance and placement for procedures requiring needle placement in or near specific anatomical locations not easily accessible without such assistance.   Informed Consent Details: Physician/Practitioner Attestation; Transcribe to consent form and obtain patient signature    Note: Always confirm laterality of pain with Ms. Renie, before procedure. Transcribe to consent form and obtain patient signature.    Physician/Practitioner attestation of informed consent for procedure/surgical case:   I, the physician/practitioner, attest that I have discussed with the patient the benefits, risks, side effects, alternatives, likelihood of achieving goals and potential problems during recovery for the procedure that I have provided informed consent.    Procedure:   Block of the genicular nerves of the knee (Genicular Nerves Block)    Physician/Practitioner performing the procedure:   [place] Hair A. Zadkiel Dragan, MD    Indication/Reason:   Chronic knee pain   Provide equipment / supplies at bedside    Procedure tray:  Block Tray (Disposable  single use) Skin infiltration needle: Regular 1.5-in, 25-G, (x1) Block Needle type: Spinal Amount/quantity: 3 Size: Regular (3.5-inch) Gauge: 22G    Standing Status:   Standing    Number of Occurrences:   1  Specify:   Block Tray   Saline lock IV    Have LR [id number] mL available and administer at 125 mL/hr if patient becomes hypotensive.    Standing Status:   Standing    Number of Occurrences:   1    Opioid Analgesic(s):  Analgesic: Oxycodone  IR 5 mg, 1 tab PO q 8 hrs (15 mg/day of oxycodone ) MME/day: 22.5 mg/day.     Medications ordered for procedure: Meds ordered this encounter  Medications   lidocaine  (XYLOCAINE ) 2 % (with pres) injection 400 mg   pentafluoroprop-tetrafluoroeth (GEBAUERS) aerosol   midazolam  (VERSED ) injection 0.5-2 mg    Make sure Flumazenil is available in the pyxis when using this medication. If oversedation occurs, administer 0.2 mg IV over 15 sec. If after 45 sec no response, administer 0.2 mg again over 1 min; may repeat at 1 min intervals; not to exceed 4 doses (1 mg)   ropivacaine  (PF) 2 mg/mL (0.2%) (NAROPIN ) injection 9 mL   methylPREDNISolone  acetate (DEPO-MEDROL ) injection 40 mg   Medications administered: We administered lidocaine , midazolam , ropivacaine  (PF) 2 mg/mL (0.2%), and methylPREDNISolone  acetate.  See the medical record for exact dosing, route, and time of administration.    Interventional Therapies  Risk Factors  Considerations  Medical Comorbidities:  Parkinson's disease  Hx right breast cancer  OIC  SOB  CKD Stage 3b  GERD  depression  osteopenia/osteoporosis  PSVT  Memory impairment      Planned  Pending:   No further interventional therapies or steroids until after 06/17/2024.  Today we will conduct a workup to see if the patient is having adrenal insufficiency.   Under consideration:  Therapeutic right suprascapular nerve RFA #1  Possible intrathecal pump trial and permanent implant     Completed:   Diagnostic left suprascapular NB x1 (04/17/2024) (10-0/10)  Diagnostic left infraspinatus TPI/MNB x1 (04/01/2024) (0/0/0/0)  Therapeutic bilateral lumbar facet Blk x15 (03/20/2024)  Therapeutic bilateral lumbar facet RFA x R4L2  Diagnostic/therapeutic right suprascapular NB x2  Therapeutic bilateral trapezius/thoracic TPI x R6L1  Therapeutic right lumbar TPI x1  Therapeutic bilateral SI inj. x R3L2  Therapeutic right SI RFA x4  Therapeutic right knee inj. (steroid x5) (Zilretta  x3) (Monovisc x1) (pes anserine x2) (12/06/2023)  Therapeutic TFESI (bilateral: L5, L2 x1) (right: L5 x4, L4 x1, L3 x1)  Therapeutic LESI (midline: L2-3 x1) (right: L3-4 x2, L4-5 x2, L5-S1 x2)    Therapeutic  Palliative (PRN) options:   Do not schedule any procedures without a proper preprocedure evaluation.   Completed by other providers:   Therapeutic right knee (ORTHOVISC) inj. x3 (05/18/2023, 05/23/2023, 05/30/2023) by Arthea Peggye Sheer, MD [facility]) Therapeutic left knee (Kenalog ) inj. x1 (08/25/2020) by Earnestine Leverette Blanch, MD ([facility])   Pharmacotherapy  Nonopioids transfer 12/21/2020: Gabapentin        Follow-up plan:   Return in about 1 week (around 06/17/2024) for (Face2F), (PPE).     Recent Visits Date Type Provider Dept  05/28/24 Office Visit Tanya Glisson, MD [facility]  05/07/24 Office Visit Tanya Glisson, MD [facility]  04/23/24 Office Visit Tanya Glisson, MD [facility]  04/17/24 Procedure visit Tanya Glisson, MD [facility]  04/01/24 Office Visit Tanya Glisson, MD [facility]  03/26/24 Office Visit Tanya Glisson, MD [facility]  03/20/24 Procedure visit Tanya Glisson, MD [facility]  Showing recent visits within past 90 days and meeting all other requirements Today's Visits Date Type Provider Dept  06/10/24 Procedure visit Tanya,  Eric, MD [facility]  Showing today's visits and meeting all other requirements Future Appointments Date Type Provider Dept  06/17/24 Appointment Tanya Eric, MD [facility]  06/18/24 Appointment Patel, Seema K, NP [facility]  Showing future appointments within next 90 days and meeting all other requirements   Disposition: Discharge home  Discharge (Date  Time): 06/10/2024; 1002 hrs.   Primary Care Physician: Glendia Shad, MD Location: [facility] Note by: Eric DELENA Tanya, MD (TTS technology used. I apologize for any typographical errors that were not detected and corrected.) Date: 06/10/2024; Time: 10:46 AM  Disclaimer:  Medicine is not an Visual merchandiser. The only guarantee in medicine is that nothing is guaranteed. It is important to note that the decision to proceed with this intervention was based on the information collected from the patient. The Data and conclusions were drawn from the patient's questionnaire, the interview, and the physical examination. Because the information was provided in large part by the patient, it cannot be guaranteed that it has not been purposely or unconsciously manipulated. Every effort has been made to obtain as much relevant data as possible for this evaluation. It is important to note that the conclusions that lead to this procedure are derived in large part from the available data. Always take into account that the treatment will also be dependent on availability of resources and existing treatment guidelines, considered by other Pain Management Practitioners as being common knowledge and practice, at the time of the intervention. For Medico-Legal purposes, it is also important to point out that variation in procedural techniques and pharmacological choices are the acceptable norm. The indications, contraindications, technique, and results of the above procedure should only be interpreted and judged by a  Board-Certified Interventional Pain Specialist with extensive familiarity and expertise in the same exact procedure and technique.

## 2024-06-08 NOTE — Progress Notes (Signed)
 Patient ID: Wanda Martinez, female   DOB: January 07, 1935, 89 y.o.   MRN: [id number] Pt did not show for appt.

## 2024-06-09 NOTE — Telephone Encounter (Signed)
 Noted

## 2024-06-10 ENCOUNTER — Ambulatory Visit
Admission: RE | Admit: 2024-06-10 | Discharge: 2024-06-10 | Disposition: A | Discharge status: Home / Self Care | Source: Ambulatory Visit | Attending: Pain Medicine | Admitting: Pain Medicine

## 2024-06-10 ENCOUNTER — Ambulatory Visit (HOSPITAL_BASED_OUTPATIENT_CLINIC_OR_DEPARTMENT_OTHER): Admitting: Pain Medicine

## 2024-06-10 ENCOUNTER — Encounter: Payer: Self-pay | Admitting: Pain Medicine

## 2024-06-10 VITALS — BP 103/62 | HR 70 | Temp 97.2°F | Resp 14 | Ht 63.0 in | Wt 152.0 lb

## 2024-06-10 DIAGNOSIS — M1711 Unilateral primary osteoarthritis, right knee: Secondary | ICD-10-CM

## 2024-06-10 DIAGNOSIS — G8929 Other chronic pain: Secondary | ICD-10-CM

## 2024-06-10 DIAGNOSIS — E8889 Other specified metabolic disorders: Secondary | ICD-10-CM | POA: Diagnosis not present

## 2024-06-10 DIAGNOSIS — M7051 Other bursitis of knee, right knee: Secondary | ICD-10-CM | POA: Diagnosis not present

## 2024-06-10 DIAGNOSIS — M25561 Pain in right knee: Secondary | ICD-10-CM | POA: Diagnosis not present

## 2024-06-10 MED ORDER — MIDAZOLAM HCL 2 MG/2ML IJ SOLN
INTRAMUSCULAR | Status: AC
  Filled 2024-06-10: qty 2

## 2024-06-10 MED ORDER — PENTAFLUOROPROP-TETRAFLUOROETH EX AERO: INHALATION_SPRAY | Freq: Once | CUTANEOUS | Status: DC

## 2024-06-10 MED ORDER — METHYLPREDNISOLONE ACETATE 40 MG/ML IJ SUSP
40.0000 mg | Freq: Once | INTRAMUSCULAR | Status: AC
  Administered 2024-06-10: 40 mg

## 2024-06-10 MED ORDER — ROPIVACAINE HCL 2 MG/ML IJ SOLN
INTRAMUSCULAR | Status: AC
  Filled 2024-06-10: qty 20

## 2024-06-10 MED ORDER — MIDAZOLAM HCL 2 MG/2ML IJ SOLN
0.5000 mg | Freq: Once | INTRAMUSCULAR | Status: AC
  Administered 2024-06-10: 1.5 mg via INTRAVENOUS

## 2024-06-10 MED ORDER — ROPIVACAINE HCL 2 MG/ML IJ SOLN
9.0000 mL | Freq: Once | INTRAMUSCULAR | Status: AC
  Administered 2024-06-10: 9 mL

## 2024-06-10 MED ORDER — LIDOCAINE HCL 2 % IJ SOLN
20.0000 mL | Freq: Once | INTRAMUSCULAR | Status: AC
  Administered 2024-06-10: 200 mg

## 2024-06-10 MED ORDER — LIDOCAINE HCL (PF) 2 % IJ SOLN
INTRAMUSCULAR | Status: AC
  Filled 2024-06-10: qty 10

## 2024-06-10 MED ORDER — METHYLPREDNISOLONE ACETATE 40 MG/ML IJ SUSP
INTRAMUSCULAR | Status: AC
  Filled 2024-06-10: qty 1

## 2024-06-10 NOTE — Patient Instructions (Signed)

## 2024-06-10 NOTE — Progress Notes (Signed)
Nursing Pain Medication Assessment:  Safety precautions to be maintained throughout the outpatient stay will include: orient to surroundings, keep bed in low position, maintain call bell within reach at all times, provide assistance with transfer out of bed and ambulation.

## 2024-06-11 ENCOUNTER — Encounter: Admitting: Nurse Practitioner

## 2024-06-11 ENCOUNTER — Telehealth: Payer: Self-pay | Admitting: *Deleted

## 2024-06-11 NOTE — Telephone Encounter (Signed)
 No problems post procedure.

## 2024-06-17 ENCOUNTER — Encounter: Payer: Self-pay | Admitting: Pain Medicine

## 2024-06-17 ENCOUNTER — Ambulatory Visit: Attending: Pain Medicine | Admitting: Pain Medicine

## 2024-06-17 VITALS — BP 110/74 | HR 52 | Temp 97.3°F | Ht 63.0 in | Wt 152.0 lb

## 2024-06-17 DIAGNOSIS — M25561 Pain in right knee: Secondary | ICD-10-CM | POA: Diagnosis not present

## 2024-06-17 DIAGNOSIS — G8929 Other chronic pain: Secondary | ICD-10-CM

## 2024-06-17 DIAGNOSIS — E8889 Other specified metabolic disorders: Secondary | ICD-10-CM | POA: Diagnosis not present

## 2024-06-17 DIAGNOSIS — M1711 Unilateral primary osteoarthritis, right knee: Secondary | ICD-10-CM | POA: Diagnosis not present

## 2024-06-17 DIAGNOSIS — M7051 Other bursitis of knee, right knee: Secondary | ICD-10-CM | POA: Diagnosis not present

## 2024-06-17 NOTE — Patient Instructions (Signed)
 ______________________________________________________________________    Procedure instructions  Stop blood-thinners  Do not eat or drink fluids (other than water) for 6 hours before your procedure  No water for 2 hours before your procedure  Take your blood pressure medicine with a sip of water  Arrive 30 minutes before your appointment  If sedation is planned, bring suitable driver. [place], [place], & public transportation are NOT APPROVED)  Carefully read the Preparing for your procedure detailed instructions  If you have questions call us  at [phone]  Procedure appointments are for procedures only.   NO medication refills or new problem evaluations will be done on procedure days.   Only the scheduled, pre-approved procedure and side will be done.   ______________________________________________________________________      ______________________________________________________________________    Preparing for your procedure  Appointments: If you think you may not be able to keep your appointment, call 24-48 hours in advance to cancel. We need time to make it available to others.  Procedure visits are for procedures only. During your procedure appointment there will be: NO Prescription Refills*. NO medication changes or discussions*. NO discussion of disability issues*. NO unrelated pain problem evaluations*. NO evaluations to order other pain procedures*. *These will be addressed at a separate and distinct evaluation encounter on the provider's evaluation schedule and not during procedure days.  Instructions: Food intake: Avoid eating anything solid for at least 8 hours prior to your procedure. Clear liquid intake: You may take clear liquids such as water up to 2 hours prior to your procedure. (No carbonated drinks. No soda.) Transportation: Unless otherwise stated by your physician, bring a driver. (Driver cannot be a Market researcher, Pharmacist, community, or any other form of public  transportation.) Morning Medicines: Except for blood thinners, take all of your other morning medications with a sip of water. Make sure to take your heart and blood pressure medicines. If your blood pressure's lower number is above 100, the case will be rescheduled. Blood thinners: Make sure to stop your blood thinners as instructed.  If you take a blood thinner, but were not instructed to stop it, call our office [phone] and ask to talk to a nurse. Not stopping a blood thinner prior to certain procedures could lead to serious complications. Diabetics on insulin: Notify the staff so that you can be scheduled 1st case in the morning. If your diabetes requires high dose insulin, take only  of your normal insulin dose the morning of the procedure and notify the staff that you have done so. Preventing infections: Shower with an antibacterial soap the morning of your procedure.  Build-up your immune system: Take 1000 mg of Vitamin C with every meal (3 times a day) the day prior to your procedure. Antibiotics: Inform the nursing staff if you are taking any antibiotics or if you have any conditions that may require antibiotics prior to procedures. (Example: recent joint implants)   Pregnancy: If you are pregnant make sure to notify the nursing staff. Not doing so may result in injury to the fetus, including death.  Sickness: If you have a cold, fever, or any active infections, call and cancel or reschedule your procedure. Receiving steroids while having an infection may result in complications. Arrival: You must be in the facility at least 30 minutes prior to your scheduled procedure. Tardiness: Your scheduled time is also the cutoff time. If you do not arrive at least 15 minutes prior to your procedure, you will be rescheduled.  Children: Do not bring any children  with you. Make arrangements to keep them home. Dress appropriately: There is always a possibility that your clothing may get soiled. Avoid  long dresses. Valuables: Do not bring any jewelry or valuables.  Reasons to call and reschedule or cancel your procedure: (Following these recommendations will minimize the risk of a serious complication.) Surgeries: Avoid having procedures within 2 weeks of any surgery. (Avoid for 2 weeks before or after any surgery). Flu Shots: Avoid having procedures within 2 weeks of a flu shots or . (Avoid for 2 weeks before or after immunizations). Barium: Avoid having a procedure within 7-10 days after having had a radiological study involving the use of radiological contrast. (Myelograms, Barium swallow or enema study). Heart attacks: Avoid any elective procedures or surgeries for the initial 6 months after a Myocardial Infarction (Heart Attack). Blood thinners: It is imperative that you stop these medications before procedures. Let us  know if you if you take any blood thinner.  Infection: Avoid procedures during or within two weeks of an infection (including chest colds or gastrointestinal problems). Symptoms associated with infections include: Localized redness, fever, chills, night sweats or profuse sweating, burning sensation when voiding, cough, congestion, stuffiness, runny nose, sore throat, diarrhea, nausea, vomiting, cold or Flu symptoms, recent or current infections. It is specially important if the infection is over the area that we intend to treat. Heart and lung problems: Symptoms that may suggest an active cardiopulmonary problem include: cough, chest pain, breathing difficulties or shortness of breath, dizziness, ankle swelling, uncontrolled high or unusually low blood pressure, and/or palpitations. If you are experiencing any of these symptoms, cancel your procedure and contact your primary care physician for an evaluation.  Remember:  Regular Business hours are:  Monday to Thursday 8:00 AM to 4:00 PM  Provider's Schedule: Eric Como, MD:  Procedure days: Tuesday and Thursday 7:30  AM to 4:00 PM  Wallie Sherry, MD:  Procedure days: Monday and Wednesday 7:30 AM to 4:00 PM Last  Updated: 11/27/2023 ______________________________________________________________________      ______________________________________________________________________    General Risks and Possible Complications  Patient Responsibilities: It is important that you read this as it is part of your informed consent. It is our duty to inform you of the risks and possible complications associated with treatments offered to you. It is your responsibility as a patient to read this and to ask questions about anything that is not clear or that you believe was not covered in this document.  Patient's Rights: You have the right to refuse treatment. You also have the right to change your mind, even after initially having agreed to have the treatment done. However, under this last option, if you wait until the last second to change your mind, you may be charged for the materials used up to that point.  Introduction: Medicine is not an Visual merchandiser. Everything in Medicine, including the lack of treatment(s), carries the potential for danger, harm, or loss (which is by definition: Risk). In Medicine, a complication is a secondary problem, condition, or disease that can aggravate an already existing one. All treatments carry the risk of possible complications. The fact that a side effects or complications occurs, does not imply that the treatment was conducted incorrectly. It must be clearly understood that these can happen even when everything is done following the highest safety standards.  No treatment: You can choose not to proceed with the proposed treatment alternative. The "PRO(s)" would include: avoiding the risk of complications associated with the therapy. The "CON(s)"  would include: not getting any of the treatment benefits. These benefits fall under one of three categories: diagnostic; therapeutic; and/or  palliative. Diagnostic benefits include: getting information which can ultimately lead to improvement of the disease or symptom(s). Therapeutic benefits are those associated with the successful treatment of the disease. Finally, palliative benefits are those related to the decrease of the primary symptoms, without necessarily curing the condition (example: decreasing the pain from a flare-up of a chronic condition, such as incurable terminal cancer).  General Risks and Complications: These are associated to most interventional treatments. They can occur alone, or in combination. They fall under one of the following six (6) categories: no benefit or worsening of symptoms; bleeding; infection; nerve damage; allergic reactions; and/or death. No benefits or worsening of symptoms: In Medicine there are no guarantees, only probabilities. No healthcare provider can ever guarantee that a medical treatment will work, they can only state the probability that it may. Furthermore, there is always the possibility that the condition may worsen, either directly, or indirectly, as a consequence of the treatment. Bleeding: This is more common if the patient is taking a blood thinner, either prescription or over the counter (example: Goody Powders, Fish oil, Aspirin, Garlic, etc.), or if suffering a condition associated with impaired coagulation (example: Hemophilia, cirrhosis of the liver, low platelet counts, etc.). However, even if you do not have one on these, it can still happen. If you have any of these conditions, or take one of these drugs, make sure to notify your treating physician. Infection: This is more common in patients with a compromised immune system, either due to disease (example: diabetes, cancer, human immunodeficiency virus [HIV], etc.), or due to medications or treatments (example: therapies used to treat cancer and rheumatological diseases). However, even if you do not have one on these, it can still  happen. If you have any of these conditions, or take one of these drugs, make sure to notify your treating physician. Nerve Damage: This is more common when the treatment is an invasive one, but it can also happen with the use of medications, such as those used in the treatment of cancer. The damage can occur to small secondary nerves, or to large primary ones, such as those in the spinal cord and brain. This damage may be temporary or permanent and it may lead to impairments that can range from temporary numbness to permanent paralysis and/or brain death. Allergic Reactions: Any time a substance or material comes in contact with our body, there is the possibility of an allergic reaction. These can range from a mild skin rash (contact dermatitis) to a severe systemic reaction (anaphylactic reaction), which can result in death. Death: In general, any medical intervention can result in death, most of the time due to an unforeseen complication. ______________________________________________________________________      ______________________________________________________________________    Medication Rules  Purpose: To inform patients, and their family members, of our medication rules and regulations.  Applies to: All patients receiving prescriptions from our practice (written or electronic).  Pharmacy of record: This is the pharmacy where your electronic prescriptions will be sent. Make sure we have the correct one.  Electronic prescriptions: In compliance with the [place]  Strengthen Opioid Misuse Prevention (STOP) Act of 2017 (Session Law 2017-74/H243), effective December 18, 2018, all controlled substances must be electronically prescribed. Written prescriptions, faxing, or calling prescriptions to a pharmacy will no longer be done.  Prescription refills: These will be provided only during in-person appointments. No medications will  be renewed without a face-to-face evaluation with your  provider. Applies to all prescriptions.  NOTE: The following applies primarily to controlled substances (Opioid* Pain Medications).   Type of encounter (visit): For patients receiving controlled substances, face-to-face visits are required. (Not an option and not up to the patient.)  Patient's Responsibilities: Pain Pills: Bring all pain pills to every appointment (except for procedure appointments). Pill counts are required.  Pill Bottles: Bring pills in original pharmacy bottle. Bring bottle, even if empty. Always bring the bottle of the most recent fill.  Medication refills: You are responsible for knowing and keeping track of what medications you are taking and when is it that you will need a refill. The day before your appointment: write a list of all prescriptions that need to be refilled. The day of the appointment: give the list to the admitting nurse. Prescriptions will be written only during appointments. No prescriptions will be written on procedure days. If you forget a medication: it will not be Called in, Faxed, or electronically sent. You will need to get another appointment to get these prescribed. No early refills. Do not call asking to have your prescription filled early. Partial  or short prescriptions: Occasionally your pharmacy may not have enough pills to fill your prescription.  NEVER ACCEPT a partial fill or a prescription that is short of the total amount of pills that you were prescribed.  With controlled substances the law allows 72 hours for the pharmacy to complete the prescription.  If the prescription is not completed within 72 hours, the pharmacist will require a new prescription to be written. This means that you will be short on your medicine and we WILL NOT send another prescription to complete your original prescription.  Instead, request the pharmacy to send a carrier to a nearby branch to get enough medication to provide you with your full  prescription. Prescription Accuracy: You are responsible for carefully inspecting your prescriptions before leaving our office. Have the discharge nurse carefully go over each prescription with you, before taking them home. Make sure that your name is accurately spelled, that your address is correct. Check the name and dose of your medication to make sure it is accurate. Check the number of pills, and the written instructions to make sure they are clear and accurate. Make sure that you are given enough medication to last until your next medication refill appointment. Taking Medication: Take medication as prescribed. When it comes to controlled substances, taking less pills or less frequently than prescribed is permitted and encouraged. Never take more pills than instructed. Never take the medication more frequently than prescribed.  Inform other Doctors: Always inform, all of your healthcare providers, of all the medications you take. Pain Medication from other Providers: You are not allowed to accept any additional pain medication from any other Doctor or Healthcare provider. There are two exceptions to this rule. (see below) In the event that you require additional pain medication, you are responsible for notifying us , as stated below. Cough Medicine: Often these contain an opioid, such as codeine or hydrocodone . Never accept or take cough medicine containing these opioids if you are already taking an opioid* medication. The combination may cause respiratory failure and death. Medication Agreement: You are responsible for carefully reading and following our Medication Agreement. This must be signed before receiving any prescriptions from our practice. Safely store a copy of your signed Agreement. Violations to the Agreement will result in no further prescriptions. (Additional copies of our  Medication Agreement are available upon request.) Laws, Rules, & Regulations: All patients are expected to follow all  [address] and [employer], Rules, [place] [employer]. Ignorance of the Laws does not constitute a valid excuse.  Illegal drugs and Controlled Substances: The use of illegal substances (including, but not limited to marijuana and its derivatives) and/or the illegal use of any controlled substances is strictly prohibited. Violation of this rule may result in the immediate and permanent discontinuation of any and all prescriptions being written by our practice. The use of any illegal substances is prohibited. Adopted CDC guidelines & recommendations: Target dosing levels will be at or below 60 MME/day. Use of benzodiazepines** is not recommended. Urine Drug testing: Patients taking controlled substances will be required to provide a urine sample upon request. Do not void before coming to your medication management appointments. Hold emptying your bladder until you are admitted. The admitting nurse will inform you if a sample is required. Our practice reserves the right to call you at any time to provide a sample. Once receiving the call, you have 24 hours to comply with request. Not providing a sample upon request may result in termination of medication therapy.  Exceptions: There are only two exceptions to the rule of not receiving pain medications from other Healthcare Providers. Exception #1 (Emergencies): In the event of an emergency (i.e.: accident requiring emergency care), you are allowed to receive additional pain medication. However, you are responsible for: As soon as you are able, call our office [phone], at any time of the day or night, and leave a message stating your name, the date and nature of the emergency, and the name and dose of the medication prescribed. In the event that your call is answered by a member of our staff, make sure to document and save the date, time, and the name of the person that took your information.  Exception #2 (Planned Surgery): In the event that you are  scheduled by another doctor or dentist to have any type of surgery or procedure, you are allowed (for a period no longer than 30 days), to receive additional pain medication, for the acute post-op pain. However, in this case, you are responsible for picking up a copy of our Post-op Pain Management for Surgeons handout, and giving it to your surgeon or dentist. This document is available at our office, and does not require an appointment to obtain it. Simply go to our office during business hours (Monday-Thursday from 8:00 AM to 4:00 PM) (Friday 8:00 AM to 12:00 Noon) or if you have a scheduled appointment with us , prior to your surgery, and ask for it by name. In addition, you are responsible for: calling our office [phone], at any time of the day or night, and leaving a message stating your name, name of your surgeon, type of surgery, and date of procedure or surgery. Failure to comply with your responsibilities may result in termination of therapy involving the controlled substances.  Consequences:  Non-compliance with the above rules may result in permanent discontinuation of medication prescription therapy. All patients receiving any type of controlled substance is expected to comply with the above patient responsibilities. Not doing so may result in permanent discontinuation of medication prescription therapy. Medication Agreement Violation. Following the above rules, including your responsibilities will help you in avoiding a Medication Agreement Violation ("Breaking your Pain Medication Contract").  *Opioid medications include: morphine , codeine, oxycodone , oxymorphone, hydrocodone , hydromorphone, meperidine, tramadol , tapentadol , buprenorphine , fentanyl , methadone . **Benzodiazepine medications include:  diazepam (Valium), alprazolam (Xanax), clonazepam (Klonopine), lorazepam (Ativan), clorazepate (Tranxene), chlordiazepoxide (Librium), estazolam (Prosom), oxazepam (Serax), temazepam  (Restoril), triazolam (Halcion) (Last updated: 10/10/2023) ______________________________________________________________________

## 2024-06-17 NOTE — Progress Notes (Signed)
 Safety precautions to be maintained throughout the outpatient stay will include: orient to surroundings, keep bed in low position, maintain call bell within reach at all times, provide assistance with transfer out of bed and ambulation.

## 2024-06-17 NOTE — Progress Notes (Signed)
 PROVIDER NOTE: Interpretation of information contained herein should be left to medically-trained personnel. Specific patient instructions are provided elsewhere under Patient Instructions section of medical record. This document was created in part using AI and STT-dictation technology, any transcriptional errors that may result from this process are unintentional.  Patient: Wanda Martinez  Service: E/M   PCP: Wanda Shad, MD  DOB: Jan 14, 1935  DOS: 06/17/2024  Provider: Eric DELENA Como, MD  MRN: [id number]  Delivery: Face-to-face  Specialty: Interventional Pain Management  Type: Established Patient  Setting: Ambulatory outpatient facility  Specialty designation: 09  Referring Prov.: Wanda Shad, MD  Location: Outpatient office facility       History of present illness (HPI) [name], a 89 y.o. year old female, is here today because of her Chronic pain of right knee [M25.561, G89.29]. [name] primary complain today is Knee Pain (right)  Pertinent problems: [name] has Headache; Chronic low back pain (1ry area of Pain) (Bilateral) (R>L) w/o sciatica; Lumbar spondylosis; Chronic hip pain (Bilateral); Chronic neck pain; Cervical spondylosis; Chronic cervical radicular pain (Right); Diffuse myofascial pain syndrome; Neurogenic pain; Chronic upper back pain (Right); Myofascial pain syndrome (Right) (cervicothoracic); Lumbar facet syndrome (Bilateral) (R>L); History of breast cancer; Cervical facet hypertrophy; Cervical facet syndrome (HCC); Chronic shoulder pain (Right); Chronic pain syndrome; Chronic sacroiliac joint pain (Left); Chronic sacroiliac joint pain (Right); 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, L4-5, L5-S1) (Right); Lumbar spinal stenosis (with neurogenic claudication) (L3-4); Chronic lower extremity pain (Right); Chronic lumbar radicular pain (Right); Trochanteric bursitis of hip (Bilateral); Spondylosis without myelopathy or radiculopathy, lumbosacral  region; Trigger point with back pain (Right); DDD (degenerative disc disease), lumbosacral; Chronic upper extremity pain (Right); Chronic thoracic back pain (Bilateral) (L>R); Trigger point of thoracic region (Bilateral) (L>R); Other specified dorsopathies, sacral and sacrococcygeal region; Sacroiliac joint dysfunction (Right); Bilateral sacroiliitis (HCC); Somatic dysfunction of sacroiliac joint (Right); Chronic musculoskeletal pain; Facial pain; Chronic hip pain (Right); Osteoarthritis of hip (Right); Left ear pain; 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HCC); Abnormal MRI, lumbar spine (03/14/2024); Osteoarthritis involving multiple joints; Other spondylosis, sacral and sacrococcygeal region; Lumbar facet hypertrophy (Multilevel) (Bilateral); Chronic knee pain (Right); Osteoarthritis of knee (Right); Trigger point of shoulder region (Right); Osteoarthritis of AC (acromioclavicular) joint (Right); Osteoarthritis of shoulder (Right); Unspecified injury of muscle(s) and tendon(s) of the rotator cuff of shoulder, sequela (Right); Cervicalgia; DDD (degenerative disc disease), lumbar; Lumbar radicular pain (Bilateral); Lumbar foraminal stenosis (Right: L3-4, L4-5, L5-S1) (Left: L2-3); Abnormal MRI, hip (Right) (05/04/2022); Abnormal MRI, knee (Right) (11/29/2023); Tricompartment osteoarthritis of knee (Right); Chronic low back pain (Right) w/o sciatica; Hoffa's fat pad disease (HCC) (Right); Hoffa's pad syndrome (HCC) (Right); Chronic low back pain (Right) w/ sciatica (Right); Lower extremity weakness (Right); Chronic radicular pain of lower back; Chronic sciatica (Right); Lumbosacral radiculopathy at L5 (Right); Low back pain of over 3 months duration; Personal history of breast cancer; Pes anserine bursitis (Right); Infrapatellar bursitis of knee (Right); Traumatic ecchymosis of right lower leg; Lumbar facet joint pain; Medial knee pain (Right); Chronic patellofemoral pain of knee (Right); Patellofemoral joint pain (Right);  Knee hemarthrosis (Right); Pes anserinus bursitis of right knee; Abnormal MRI, shoulder (Right) (06/21/2023); Wound, open, leg; Chronic hip pain (Left); Osteoarthritis of hips (Bilateral) (R>L); Intractable back pain, acute on chronic; Intermittent low back pain; Trochanteric bursitis of left hip; Sprain of left shoulder, initial encounter; Chronic shoulder pain (Left); Osteoarthritis of AC (acromioclavicular) joint (Left); Arthralgia of acromioclavicular joint (Left); Osteoarthritis of shoulder (Left); Trigger point of shoulder region (Left); Mechanical low  back pain; Multifactorial low back pain; Chronic shoulder pain (Bilateral); and Arthralgia of multiple sites on their pertinent problem list.  Pain Assessment: Severity of Chronic pain is reported as a 7 /10. Location: Knee Right/Denies. Onset: More than a month ago. Quality: Aching, Burning, Constant. Timing: Constant. Modifying factor(s): meds and procedures. Vitals:  height is 5' 3 (1.6 m) and weight is 152 lb (68.9 kg). Her temperature is 97.3 F (36.3 C) (abnormal). Her blood pressure is 110/74 and her pulse is 52 (abnormal). Her oxygen saturation is 99%.  BMI: Estimated body mass index is 26.93 kg/m as calculated from the following:   Height as of this encounter: 5' 3 (1.6 m).   Weight as of this encounter: 152 lb (68.9 kg).  Last encounter: 05/28/2024. Last procedure: 06/10/2024.  Reason for encounter: post-procedure evaluation and assessment.  PMP review has demonstrated the patient to have received oxycodone  IR 5 mg tablets (# 60) from Wanda Finder, MD.  This prescription was filled on 06/16/2024.  In addition, there is also evidence that the patient obtained a Duragesic  25 mcg/h patch (# 5) from [facility], DO on 05/31/2024 as well has a prescription for hydrocodone /APAP 5/325 (# 90) on 05/31/2024 also by Wanda Corene Pepper, DO.  Unfortunately, this is a clear violation of our medication agreement.    Discussed the  use of AI scribe software for clinical note transcription with the patient, who gave verbal consent to proceed.  History of Present Illness   Wanda Martinez is an 89 year old female who presents with knee pain following a diagnostic genicular nerve block. She is accompanied by her husband.  She underwent a diagnostic right genicular nerve block, which initially provided complete pain relief while the area was numb. However, the relief diminished to about 50% over the following five days, after which the pain returned.  She has been using a fentanyl  patch at a dose of 12.5 mcg per hour, initially filled on May 07, 2024, with an early refill on May 14, 2024. On May 31, 2024, she received a prescription for 90 tablets of hydrocodone  and another prescription for a fentanyl  patch from a different physician. On June 16, 2024, she was prescribed 60 tablets of oxycodone  by yet another physician. These prescriptions were obtained through hospice care, involving multiple physicians.   Based on the report given on the results of the patient's diagnostic genicular nerve blocks and the events that are taking place related to her medications, it is my professional opinion that we need to move as soon as possible to the radiofrequency ablation of the genicular nerves in order to mitigate this pain and moved to her next area of concern which she has voiced is her back.  Because I feel I have lost control of the way that this patient is using her medications, I will no longer be prescribing any further opioids since she clearly has been getting them from other practitioners.  I have canceled any future medication management appointments and I will simply concentrate on the interventional therapies to help her pain.  This was communicated today to the patient and her husband.    Post-Procedure Evaluation   Type: Genicular Nerves Block (Superolateral, Superomedial, and Inferomedial Genicular Nerves)  #1  Laterality:  Right (-RT)  Level: Superior and inferior to the knee joint.  Imaging: Fluoroscopic guidance Anesthesia: Local anesthesia (1-2% Lidocaine ) Anxiolysis: IV Versed          Sedation: No Sedation  DOS: 06/10/2024  Performed by: Wanda DELENA Como, MD  Purpose: Diagnostic/Therapeutic Indications: Chronic knee pain severe enough to impact quality of life or function. Rationale (medical necessity): procedure needed and proper for the diagnosis and/or treatment of [name]'s medical symptoms and needs. 1. Chronic knee pain (Right)   2. Chronic patellofemoral pain of knee (Right)   3. Hoffa's fat pad disease (HCC) (Right)   4. Hoffa's pad syndrome (HCC) (Right)   5. Infrapatellar bursitis of knee (Right)   6. Medial knee pain (Right)   7. Osteoarthritis of knee (Right)   8. Pes anserinus bursitis of right knee    NAS-11 Pain score:   Pre-procedure: 9 /10   Post-procedure: 0-No pain/10     Effectiveness:  Initial hour after procedure: 100 %. Subsequent 4-6 hours post-procedure: 100 %. Analgesia past initial 6 hours: 50 % (lasted for 5 days). Ongoing improvement:  Analgesic: The patient indicates having attained 100% relief of the pain for the duration of local anesthetic followed by a decrease to a 50% improvement that only lasted for about 5 days. Function: Transient improvement ROM: Transient improvement   Pharmacotherapy Assessment   Analgesic: Fentanyl  12.5 mcg/h patch, 1 patch every 3 days (72 hours) (# 5) (last filled on 05/14/2024 and 05/07/2024?) MME/day: 28.8 mg/day.    Monitoring: [place] PMP: PDMP reviewed during this encounter.       Pharmacotherapy: No side-effects or adverse reactions reported. Compliance: No problems identified. Effectiveness: Clinically acceptable.  Delores Dorothe DELENA, RN  06/17/2024  1:51 PM  Sign when Signing Visit Safety precautions to be maintained throughout the outpatient stay will include: orient to surroundings, keep bed in low  position, maintain call bell within reach at all times, provide assistance with transfer out of bed and ambulation.     UDS:  Summary  Date Value Ref Range Status  04/23/2024 FINAL  Final    Comment:    ==================================================================== Compliance Drug Analysis, Ur ==================================================================== Test                             Result       Flag       Units  Drug Present and Declared for Prescription Verification   Oxycodone                       4533         EXPECTED   ng/mg creat   Noroxycodone                   4070         EXPECTED   ng/mg creat    Sources of oxycodone  include scheduled prescription medications.    Noroxycodone is an expected metabolite of oxycodone .  Drug Present not Declared for Prescription Verification   Gabapentin                      PRESENT      UNEXPECTED   Baclofen                        PRESENT      UNEXPECTED   Citalopram                      PRESENT      UNEXPECTED   Desmethylcitalopram            PRESENT  UNEXPECTED    Desmethylcitalopram is an expected metabolite of citalopram  or the    enantiomeric form, escitalopram.    Acetaminophen                   PRESENT      UNEXPECTED   Lidocaine                       PRESENT      UNEXPECTED   Metoprolol                      PRESENT      UNEXPECTED ==================================================================== Test                      Result    Flag   Units      Ref Range   Creatinine              106              mg/dL      >=79 ==================================================================== Declared Medications:  The flagging and interpretation on this report are based on the  following declared medications.  Unexpected results may arise from  inaccuracies in the declared medications.   **Note: The testing scope of this panel includes these medications:   Oxycodone    **Note: The testing scope of this panel does not  include the  following reported medications:   Omega-3 Fatty Acids  Pantoprazole  (Protonix )  Polyethylene Glycol (MiraLAX )  Prednisone  (Deltasone )  Rosuvastatin  (Crestor ) ==================================================================== For clinical consultation, please call [phone]. ====================================================================     No results found for: CBDTHCR No results found for: D8THCCBX No results found for: D9THCCBX  ROS  Constitutional: Denies any fever or chills Gastrointestinal: No reported hemesis, hematochezia, vomiting, or acute GI distress Musculoskeletal: Denies any acute onset joint swelling, redness, loss of ROM, or weakness Neurological: No reported episodes of acute onset apraxia, aphasia, dysarthria, agnosia, amnesia, paralysis, loss of coordination, or loss of consciousness  Medication Review  AMBULATORY NON FORMULARY MEDICATION, Glucosamine-Chondroitin, Omega-3, acetaminophen , amLODipine , aspirin EC, calcium  carbonate, citalopram , donepezil , fentaNYL , gabapentin , hydrochlorothiazide , losartan , magnesium  oxide, melatonin, meloxicam , methocarbamol , metoprolol  succinate, mirabegron  ER, multivitamin, naloxone , oxyCODONE , pantoprazole , polyethylene glycol, predniSONE , and rosuvastatin   History Review  Allergy: Wanda Martinez is allergic to sulfa antibiotics and vesicare [solifenacin]. Drug: Wanda Martinez  reports current drug use. Alcohol:  reports no history of alcohol use. Tobacco:  reports that she has never smoked. She has never been exposed to tobacco smoke. She has never used smokeless tobacco. Social: Wanda Martinez  reports that she has never smoked. She has never been exposed to tobacco smoke. She has never used smokeless tobacco. She reports current drug use. She reports that she does not drink alcohol. Medical:  has a past medical history of Acute postoperative pain (10/25/2017), Anemia, Arm pain (07/26/2015), Arthritis, Arthritis,  degenerative (03/26/2014), Back pain (11/01/2013), Bladder infection (06/2018), Breast cancer (HCC), Breast cancer (HCC), CHEST PAIN (04/29/2010), Chronic cystitis, Cystocele (02/22/2013), Cystocele, midline (08/19/2013), Degeneration of intervertebral disc of lumbosacral region (03/26/2014), DYSPNEA (04/29/2010), Enthesopathy of hip (03/26/2014), GERD (gastroesophageal reflux disease), Hiatal hernia, HTN (hypertension), Hypokalemia (06/2018), Hyponatremia (06/2018), LBP (low back pain) (03/26/2014), Neck pain (11/01/2013), Parkinson disease (HCC), Pneumonia (06/2018), Sinusitis (02/07/2015), Skin lesions (07/12/2014), and Urinary incontinence. Surgical: Wanda Martinez  has a past surgical history that includes Tonsillectomy and adenoidectomy (1979); Vesicovaginal fistula closure w/ TAH (1983); Breast surgery (1986); Breast enhancement surgery (1987); Breast implant removal; Breast implant removal (Right,  08/29/2012); Mastectomy (08/2012); Abdominal hysterectomy; Colonoscopy with propofol  (N/A, 09/13/2016); Breast biopsy (2013); and Blepharoplasty. Family: family history includes Colon polyps in her father; Diabetes in her father; Parkinson's disease in her mother; Stroke in her father and mother.  Laboratory Chemistry Profile   Renal Lab Results  Component Value Date   BUN 43 (H) 04/24/2024   CREATININE 0.96 04/24/2024   BCR 38 (H) 04/23/2024   GFR 46.06 (L) 02/18/2024   GFRAA >60 07/27/2020   GFRNONAA 58 (L) 04/24/2024    Hepatic Lab Results  Component Value Date   AST 22 04/24/2024   ALT 20 04/24/2024   ALBUMIN 3.1 (L) 04/24/2024   ALKPHOS 26 (L) 04/24/2024    Electrolytes Lab Results  Component Value Date   NA 132 (L) 04/24/2024   K 3.7 04/24/2024   CL 98 04/24/2024   CALCIUM  8.8 (L) 04/24/2024   MG 2.4 (H) 04/23/2024    Bone Lab Results  Component Value Date   VD25OH 66.95 09/26/2022   25OHVITD1 61 04/23/2024   25OHVITD2 <1.0 04/23/2024   25OHVITD3 60 04/23/2024    Inflammation (CRP: Acute  Phase) (ESR: Chronic Phase) Lab Results  Component Value Date   CRP 27 (H) 04/23/2024   ESRSEDRATE 15 04/23/2024         Note: Above Lab results reviewed.  Recent Imaging Review  DG PAIN CLIN[facility] C-ARM 1-60 MIN NO REPORT Fluoro was used, but no Radiologist interpretation will be provided.  Please refer to NOTES tab for provider progress note. Note: Reviewed        Physical Exam  Vitals: BP 110/74   Pulse (!) 52   Temp (!) 97.3 F (36.3 C)   Ht 5' 3 (1.6 m)   Wt 152 lb (68.9 kg)   LMP 12/18/1981   SpO2 99%   BMI 26.93 kg/m  BMI: Estimated body mass index is 26.93 kg/m as calculated from the following:   Height as of this encounter: 5' 3 (1.6 m).   Weight as of this encounter: 152 lb (68.9 kg). Ideal: Ideal body weight: 52.4 kg (115 lb 8.3 oz) Adjusted ideal body weight: 59 kg (130 lb 1.8 oz) General appearance: Well nourished, well developed, and well hydrated. In no apparent acute distress Mental status: Alert, oriented x 3 (person, place, & time)       Respiratory: No evidence of acute respiratory distress Eyes: PERLA Assessment   Diagnosis Status  1. Chronic knee pain (Right)   2. Chronic patellofemoral pain of knee (Right)   3. Hoffa's fat pad disease (HCC) (Right)   4. Hoffa's pad syndrome (HCC) (Right)   5. Infrapatellar bursitis of knee (Right)   6. Medial knee pain (Right)   7. Osteoarthritis of knee (Right)   8. Pes anserinus bursitis of right knee    Controlled Controlled Controlled   Updated Problems: No problems updated.  Plan of Care  Problem-specific:  Assessment and Plan    Chronic knee pain   Chronic knee pain persists despite previous diagnostic right genicular nerve block, which provided complete relief while numb and approximately 50% relief for five days post-procedure. Pain has since returned. Seek approval for radiofrequency ablation of the genicular nerve to provide longer-lasting relief.  Opioid use disorder   There are concerns  regarding opioid use disorder due to multiple prescriptions from different providers, including fentanyl  patches, hydrocodone , and oxycodone , against the medication management agreement. She is receiving medications from hospice care, which will now manage her opioid prescriptions. There is a  risk of overdose due to increased opioid dosage and multiple prescribers. Cancel appointment for opioid refill. Ensure hospice care manages opioid prescriptions. Educate on risks of opioid overdose and ensure availability of Narcan  nasal spray.       Wanda Martinez has a current medication list which includes the following long-term medication(s): amlodipine , calcium  carbonate, citalopram , citalopram , donepezil , fentanyl , fentanyl , gabapentin , hydrochlorothiazide , losartan , magnesium  oxide, metoprolol  succinate, mirabegron  er, oxycodone , oxycodone , pantoprazole , and rosuvastatin .  Pharmacotherapy (Medications Ordered): No orders of the defined types were placed in this encounter.  Orders:  Orders Placed This Encounter  Procedures   Radiofrequency,Genicular    Standing Status:   Future    Expiration Date:   09/17/2024    Scheduling Instructions:     Procedure: Genicular nerve(s) Radiofrequency Ablation (RFA)     Side(s): Right Knee     Level(s): Superior-Lateral, Superior-Medial, and Inferior-Medial Genicular Nerve(s)     Sedation: With Sedation.     Scheduling Timeframe: As soon as pre-approved    Where will this procedure be performed?:   [facility]     Interventional Therapies  Risk Factors  Considerations  Medical Comorbidities:  Parkinson's disease  Hx right breast cancer  OIC  SOB  CKD Stage 3b  GERD  depression  osteopenia/osteoporosis  PSVT  Memory impairment      Planned  Pending:   Therapeutic right genicular nerve RFA #1    Under consideration:  Therapeutic right suprascapular nerve RFA #1  Possible intrathecal pump trial and permanent implant     Completed:   Diagnostic right genicular nerve blocks x1 (06/10/2024) (100/100/50/50)  Diagnostic left suprascapular NB x1 (04/17/2024) (10-0/10)  Diagnostic left infraspinatus TPI/MNB x1 (04/01/2024) (0/0/0/0)  Therapeutic bilateral lumbar facet Blk x15 (03/20/2024)  Therapeutic bilateral lumbar facet RFA x R4L2  Diagnostic/therapeutic right suprascapular NB x2  Therapeutic bilateral trapezius/thoracic TPI x R6L1  Therapeutic right lumbar TPI x1  Therapeutic bilateral SI inj. x R3L2  Therapeutic right SI RFA x4  Therapeutic right knee inj. (steroid x5) (Zilretta  x3) (Monovisc x1) (pes anserine x2) (12/06/2023)  Therapeutic TFESI (bilateral: L5, L2 x1) (right: L5 x4, L4 x1, L3 x1)  Therapeutic LESI (midline: L2-3 x1) (right: L3-4 x2, L4-5 x2, L5-S1 x2)    Therapeutic  Palliative (PRN) options:   Do not schedule any procedures without a proper preprocedure evaluation.   Completed by other providers:   Therapeutic right knee (ORTHOVISC) inj. x3 (05/18/2023, 05/23/2023, 05/30/2023) by Arthea Peggye Sheer, MD [facility]) Therapeutic left knee (Kenalog ) inj. x1 (08/25/2020) by Earnestine Leverette Blanch, MD ([facility])   Pharmacotherapy  Nonopioids transfer 12/21/2020: Gabapentin       Return for (ECT): (R) Genicular N. RFA #1.    Recent Visits Date Type Provider Dept  06/10/24 Procedure visit Tanya Glisson, MD [facility]  05/28/24 Office Visit Tanya Glisson, MD [facility]  05/07/24 Office Visit Tanya Glisson, MD [facility]  04/23/24 Office Visit Tanya Glisson, MD [facility]  04/17/24 Procedure visit Tanya Glisson, MD [facility]  04/01/24 Office Visit Tanya Glisson, MD [facility]  03/26/24 Office Visit Tanya Glisson, MD [facility]  03/20/24 Procedure visit Tanya Glisson, MD [facility]  Showing recent visits within past 90 days and meeting all other  requirements Today's Visits Date Type Provider Dept  06/17/24 Office Visit Tanya Glisson, MD [facility]  Showing today's visits and meeting all other requirements Future Appointments Date Type Provider Dept  06/18/24 Appointment Blanch,  Seema K, NP [facility]  Showing future appointments within next 90 days and meeting all other requirements  I discussed the assessment and treatment plan with the patient. The patient was provided an opportunity to ask questions and all were answered. The patient agreed with the plan and demonstrated an understanding of the instructions.  Patient advised to call back or seek an in-person evaluation if the symptoms or condition worsens.  Duration of encounter: 54 minutes.  Total time on encounter, as per AMA guidelines included both the face-to-face and non-face-to-face time personally spent by the physician and/or other qualified health care professional(s) on the day of the encounter (includes time in activities that require the physician or other qualified health care professional and does not include time in activities normally performed by clinical staff). Physician's time may include the following activities when performed: Preparing to see the patient (e.g., pre-charting review of records, searching for previously ordered imaging, lab work, and nerve conduction tests) Review of prior analgesic pharmacotherapies. Reviewing PMP Interpreting ordered tests (e.g., lab work, imaging, nerve conduction tests) Performing post-procedure evaluations, including interpretation of diagnostic procedures Obtaining and/or reviewing separately obtained history Performing a medically appropriate examination and/or evaluation Counseling and educating the patient/family/caregiver Ordering medications, tests, or procedures Referring and communicating with other health care professionals (when not separately reported) Documenting clinical  information in the electronic or other health record Independently interpreting results (not separately reported) and communicating results to the patient/ family/caregiver Care coordination (not separately reported)  Note by: Wanda DELENA Como, MD (TTS and AI technology used. I apologize for any typographical errors that were not detected and corrected.) Date: 06/17/2024; Time: 2:44 PM

## 2024-06-18 ENCOUNTER — Encounter: Admitting: Nurse Practitioner

## 2024-06-19 ENCOUNTER — Ambulatory Visit
Admission: RE | Admit: 2024-06-19 | Discharge: 2024-06-19 | Disposition: A | Discharge status: Home / Self Care | Source: Ambulatory Visit | Attending: Pain Medicine | Admitting: Pain Medicine

## 2024-06-19 ENCOUNTER — Encounter: Payer: Self-pay | Admitting: Pain Medicine

## 2024-06-19 ENCOUNTER — Ambulatory Visit: Admitting: Pain Medicine

## 2024-06-19 VITALS — BP 191/93 | HR 51 | Temp 97.2°F | Resp 18 | Ht 63.0 in | Wt 152.0 lb

## 2024-06-19 DIAGNOSIS — E8889 Other specified metabolic disorders: Secondary | ICD-10-CM | POA: Diagnosis not present

## 2024-06-19 DIAGNOSIS — G8918 Other acute postprocedural pain: Secondary | ICD-10-CM

## 2024-06-19 DIAGNOSIS — M1711 Unilateral primary osteoarthritis, right knee: Secondary | ICD-10-CM | POA: Insufficient documentation

## 2024-06-19 DIAGNOSIS — G8929 Other chronic pain: Secondary | ICD-10-CM

## 2024-06-19 DIAGNOSIS — R936 Abnormal findings on diagnostic imaging of limbs: Secondary | ICD-10-CM | POA: Insufficient documentation

## 2024-06-19 DIAGNOSIS — M7051 Other bursitis of knee, right knee: Secondary | ICD-10-CM | POA: Insufficient documentation

## 2024-06-19 DIAGNOSIS — M25561 Pain in right knee: Secondary | ICD-10-CM

## 2024-06-19 MED ORDER — LIDOCAINE HCL 2 % IJ SOLN
20.0000 mL | Freq: Once | INTRAMUSCULAR | Status: AC
  Administered 2024-06-19: 400 mg

## 2024-06-19 MED ORDER — MIDAZOLAM HCL 5 MG/5ML IJ SOLN
INTRAMUSCULAR | Status: AC
  Filled 2024-06-19: qty 5

## 2024-06-19 MED ORDER — FENTANYL CITRATE (PF) 100 MCG/2ML IJ SOLN
25.0000 ug | INTRAMUSCULAR | Status: DC | PRN
  Administered 2024-06-19: 50 ug via INTRAVENOUS

## 2024-06-19 MED ORDER — ROPIVACAINE HCL 2 MG/ML IJ SOLN
INTRAMUSCULAR | Status: AC
  Filled 2024-06-19: qty 20

## 2024-06-19 MED ORDER — FENTANYL CITRATE (PF) 100 MCG/2ML IJ SOLN
INTRAMUSCULAR | Status: AC
  Filled 2024-06-19: qty 2

## 2024-06-19 MED ORDER — METHYLPREDNISOLONE ACETATE 80 MG/ML IJ SUSP
80.0000 mg | Freq: Once | INTRAMUSCULAR | Status: AC
  Administered 2024-06-19: 80 mg

## 2024-06-19 MED ORDER — MIDAZOLAM HCL 5 MG/5ML IJ SOLN
0.5000 mg | Freq: Once | INTRAMUSCULAR | Status: AC
  Administered 2024-06-19: 2 mg via INTRAVENOUS

## 2024-06-19 MED ORDER — PENTAFLUOROPROP-TETRAFLUOROETH EX AERO
INHALATION_SPRAY | Freq: Once | CUTANEOUS | Status: AC
  Administered 2024-06-19: 30 via TOPICAL

## 2024-06-19 MED ORDER — LIDOCAINE HCL 2 % IJ SOLN
INTRAMUSCULAR | Status: AC
  Filled 2024-06-19: qty 20

## 2024-06-19 MED ORDER — METHYLPREDNISOLONE ACETATE 80 MG/ML IJ SUSP
INTRAMUSCULAR | Status: AC
  Filled 2024-06-19: qty 1

## 2024-06-19 MED ORDER — ROPIVACAINE HCL 2 MG/ML IJ SOLN
9.0000 mL | Freq: Once | INTRAMUSCULAR | Status: AC
  Administered 2024-06-19: 9 mL

## 2024-06-19 NOTE — Patient Instructions (Signed)
Moderate Conscious Sedation, Adult, Care After  After the procedure, it is common to have:  Sleepiness for a few hours.  Impaired judgment for a few hours.  Trouble with balance.  Nausea or vomiting if you eat too soon.  Follow these instructions at home:  For the time period you were told by your health care provider:    Rest.  Do not participate in activities where you could fall or become injured.  Do not drive or use machinery.  Do not drink alcohol.  Do not take sleeping pills or medicines that cause drowsiness.  Do not make important decisions or sign legal documents.  Do not take care of children on your own.  Eating and drinking  Follow instructions from your health care provider about what you may eat and drink.  Drink enough fluid to keep your urine pale yellow.  If you vomit:  Drink clear fluids slowly and in small amounts as you are able. Clear fluids include water, ice chips, low-calorie sports drinks, and fruit juice that has water added to it (diluted fruit juice).  Eat light and bland foods in small amounts as you are able. These foods include bananas, applesauce, rice, lean meats, toast, and crackers.  General instructions  Take over-the-counter and prescription medicines only as told by your health care provider.  Have a responsible adult stay with you for the time you are told.  Do not use any products that contain nicotine or tobacco. These products include cigarettes, chewing tobacco, and vaping devices, such as e-cigarettes. If you need help quitting, ask your health care provider.  Return to your normal activities as told by your health care provider. Ask your health care provider what activities are safe for you.  Your health care provider may give you more instructions. Make sure you know what you can and cannot do.  Contact a health care provider if:  You are still sleepy or having trouble with balance after 24 hours.  You feel light-headed.  You vomit every time you eat or drink.  You get  a rash.  You have a fever.  You have redness or swelling around the IV site.  Get help right away if:  You have trouble breathing.  You start to feel confused at home.  These symptoms may be an emergency. Get help right away. Call 911.  Do not wait to see if the symptoms will go away.  Do not drive yourself to the hospital.  This information is not intended to replace advice given to you by your health care provider. Make sure you discuss any questions you have with your health care provider.  Document Revised: 06/19/2022 Document Reviewed: 06/19/2022  Elsevier Patient Education Â© 2024 [employer]

## 2024-06-19 NOTE — Progress Notes (Signed)
 Safety precautions to be maintained throughout the outpatient stay will include: orient to surroundings, keep bed in low position, maintain call bell within reach at all times, provide assistance with transfer out of bed and ambulation.

## 2024-06-19 NOTE — Progress Notes (Addendum)
 PROVIDER NOTE: Interpretation of information contained herein should be left to medically-trained personnel. Specific patient instructions are provided elsewhere under Patient Instructions section of medical record. This document was created in part using STT-dictation technology, any transcriptional errors that may result from this process are unintentional.  Patient: Wanda Martinez Type: Established DOB: [birth year] MRN: [id number] PCP: Glendia Shad, MD  Service: Procedure DOS: 06/19/2024 Setting: Ambulatory Location: Ambulatory outpatient facility Delivery: Face-to-face Provider: Eric DELENA Como, MD Specialty: Interventional Pain Management Specialty designation: 09 Location: Outpatient facility Ref. Prov.: Como Eric, MD       Interventional Therapy     Procedure:          Anesthesia, Analgesia, Anxiolysis:  Type: Therapeutic Superolateral, Superomedial, and Inferomedial, Genicular Nerve Radiofrequency Ablation (destruction).   #1  Region: Lateral, Anterior, and Medial aspects of the knee joint, above and below the knee joint proper. Level: Superior and inferior to the knee joint. Laterality: Right  Anesthesia: Local (1-2% Lidocaine )  Anxiolysis: IV Versed   Sedation: Moderate  Guidance: Fluoroscopy Non-spinal ([id number])   Position: Supine   Indications: 1. Chronic knee pain (Right)   2. Medial knee pain (Right)   3. Osteoarthritis of knee (Right)   4. Hoffa's fat pad disease (HCC) (Right)   5. Hoffa's pad syndrome (HCC) (Right)   6. Pes anserinus bursitis of right knee   7. Tricompartment osteoarthritis of knee (Right)   8. Abnormal MRI, knee (Right) (11/29/2023)    Wanda Martinez has been dealing with the above chronic pain for longer than three months and has either failed to respond, was unable to tolerate, or simply did not get enough benefit from other more conservative therapies including, but not limited to: 1. Over-the-counter medications 2.  Anti-inflammatory medications 3. Muscle relaxants 4. Membrane stabilizers 5. Opioids 6. Physical therapy and/or chiropractic manipulation 7. Modalities (Heat, ice, etc.) 8. Invasive techniques such as nerve blocks. Wanda Martinez has attained more than 50% relief of the pain from a series of diagnostic injections conducted in separate occasions.  Pain Score: Pre-procedure: 8 /10 Post-procedure: 0-No pain/10    H&P (Pre-op Assessment):  Wanda Martinez is a 89 y.o. (year old), female patient, seen today for interventional treatment. She  has a past surgical history that includes Tonsillectomy and adenoidectomy (1979); Vesicovaginal fistula closure w/ TAH (1983); Breast surgery (1986); Breast enhancement surgery (1987); Breast implant removal; Breast implant removal (Right, 08/29/2012); Mastectomy (08/2012); Abdominal hysterectomy; Colonoscopy with propofol  (N/A, 09/13/2016); Breast biopsy (2013); and Blepharoplasty. Wanda Martinez has a current medication list which includes the following prescription(s): acetaminophen , AMBULATORY NON FORMULARY MEDICATION, amlodipine , aspirin ec, calcium  carbonate, citalopram , citalopram , donepezil , fentanyl , fentanyl , gabapentin , glucosamine-chondroitin, hydrochlorothiazide , losartan , magnesium  oxide, melatonin, meloxicam , methocarbamol , metoprolol  succinate, multivitamin, naloxone , omega-3, oxycodone , oxycodone , pantoprazole , polyethylene glycol, prednisone , mirabegron  er, and rosuvastatin . Her primarily concern today is the Knee Pain (Right )  Initial Vital Signs:  Pulse/HCG Rate: (!) 51ECG Heart Rate: (!) 53 Temp: (!) 97 F (36.1 C) Resp: 16 BP: 113/74 SpO2: 99 %  BMI: Estimated body mass index is 26.93 kg/m as calculated from the following:   Height as of this encounter: 5' 3 (1.6 m).   Weight as of this encounter: 152 lb (68.9 kg).  Risk Assessment: Allergies: Reviewed. She is allergic to sulfa antibiotics and vesicare [solifenacin].  Allergy Precautions:  None required Coagulopathies: Reviewed. None identified.  Blood-thinner therapy: None at this time Active Infection(s): Reviewed. None identified. Wanda Martinez is afebrile  Site Confirmation: Wanda Martinez was asked to confirm the procedure and  laterality before marking the site Procedure checklist: Completed Consent: Before the procedure and under the influence of no sedative(s), amnesic(s), or anxiolytics, the patient was informed of the treatment options, risks and possible complications. To fulfill our ethical and legal obligations, as recommended by the American Medical Association's Code of Ethics, I have informed the patient of my clinical impression; the nature and purpose of the treatment or procedure; the risks, benefits, and possible complications of the intervention; the alternatives, including doing nothing; the risk(s) and benefit(s) of the alternative treatment(s) or procedure(s); and the risk(s) and benefit(s) of doing nothing. The patient was provided information about the general risks and possible complications associated with the procedure. These may include, but are not limited to: failure to achieve desired goals, infection, bleeding, organ or nerve damage, allergic reactions, paralysis, and death. In addition, the patient was informed of those risks and complications associated to the procedure, such as failure to decrease pain; infection; bleeding; organ or nerve damage with subsequent damage to sensory, motor, and/or autonomic systems, resulting in permanent pain, numbness, and/or weakness of one or several areas of the body; allergic reactions; (i.e.: anaphylactic reaction); and/or death. Furthermore, the patient was informed of those risks and complications associated with the medications. These include, but are not limited to: allergic reactions (i.e.: anaphylactic or anaphylactoid reaction(s)); adrenal axis suppression; blood sugar elevation that in diabetics may result in  ketoacidosis or comma; water retention that in patients with history of congestive heart failure may result in shortness of breath, pulmonary edema, and decompensation with resultant heart failure; weight gain; swelling or edema; medication-induced neural toxicity; particulate matter embolism and blood vessel occlusion with resultant organ, and/or nervous system infarction; and/or aseptic necrosis of one or more joints. Finally, the patient was informed that Medicine is not an exact science; therefore, there is also the possibility of unforeseen or unpredictable risks and/or possible complications that may result in a catastrophic outcome. The patient indicated having understood very clearly. We have given the patient no guarantees and we have made no promises. Enough time was given to the patient to ask questions, all of which were answered to the patient's satisfaction. Ms. Kinoshita has indicated that she wanted to continue with the procedure. Attestation: I, the ordering provider, attest that I have discussed with the patient the benefits, risks, side-effects, alternatives, likelihood of achieving goals, and potential problems during recovery for the procedure that I have provided informed consent. Date  Time: 06/19/2024 11:23 AM  Pre-Procedure Preparation:  Monitoring: As per clinic protocol. Respiration, ETCO2, SpO2, BP, heart rate and rhythm monitor placed and checked for adequate function Safety Precautions: Patient was assessed for positional comfort and pressure points before starting the procedure. Time-out: I initiated and conducted the Time-out before starting the procedure, as per protocol. The patient was asked to participate by confirming the accuracy of the Time Out information. Verification of the correct person, site, and procedure were performed and confirmed by me, the nursing staff, and the patient. Time-out conducted as per Joint Commission's Universal Protocol (UP.01.01.01). Time:  1238 Start Time: 1238 hrs.  Description of Procedure:          Target Area: For Genicular Nerve radiofrequency ablation (destruction), the targets are: the superolateral genicular nerve, located in the lateral distal portion of the femoral shaft as it curves to form the lateral epicondyle, in the region of the distal femoral metaphysis; the superomedial genicular nerve, located in the medial distal portion of the femoral shaft as it curves to  form the medial epicondyle; and the inferomedial genicular nerve, located in the medial, proximal portion of the tibial shaft, as it curves to form the medial epicondyle, in the region of the proximal tibial metaphysis. Approach: Anterior, ipsilateral approach. Area Prepped: Entire knee area, from mid-thigh to mid-shin, lateral, anterior, and medial aspects. ChloraPrep (2% chlorhexidine gluconate and 70% isopropyl alcohol) Safety Precautions: Aspiration looking for blood return was conducted prior to all injections. At no point did we inject any substances, as a needle was being advanced. No attempts were made at seeking any paresthesias. Safe injection practices and needle disposal techniques used. Medications properly checked for expiration dates. SDV (single dose vial) medications used. Description of the Procedure: Protocol guidelines were followed. The patient was placed in position over the procedure table. The target area was identified and the area prepped in the usual manner. The skin and muscle were infiltrated with local anesthetic. Appropriate amount of time allowed to pass for local anesthetics to take effect. Radiofrequency needles were introduced to the target area using fluoroscopic guidance. Using the NeuroTherm NT1100 Radiofrequency Generator, sensory stimulation using 50 Hz was used to locate & identify the nerve, making sure that the needle was positioned such that there was no sensory stimulation below 0.3 V or above 0.7 V. Stimulation using 2 Hz  was used to evaluate the motor component. Care was taken not to lesion any nerves that demonstrated motor stimulation of the lower extremities at an output of less than 2.5 times that of the sensory threshold, or a maximum of 2.0 V. Once satisfactory placement of the needles was achieved, the numbing solution was slowly injected after negative aspiration. After waiting for at least 2 minutes, the ablation was performed at 80 degrees C for 60 seconds, using regular Radiofrequency settings. Once the procedure was completed, the needles were then removed and the area cleansed, making sure to leave some of the prepping solution back to take advantage of its long term bactericidal properties. Intra-operative Compliance: Compliant      Vitals:   06/19/24 1310 06/19/24 1320 06/19/24 1330 06/19/24 1342  BP: (!) 174/80 (!) 174/72 (!) 166/85 (!) 191/93  Pulse:      Resp: 15 11 17 18   Temp:  (!) 97.1 F (36.2 C)  (!) 97.2 F (36.2 C)  TempSrc:      SpO2: 97% 96% 97% 97%  Weight:      Height:        Start Time: 1238 hrs. End Time: 1310 hrs. Materials & Medications:  Needle(s) Type: Teflon-coated, curved tip, Radiofrequency needle(s) Gauge: 22G Length: 10cm Medication(s): Please see orders for medications and dosing details.  Imaging Guidance (Non-Spinal):          Type of Imaging Technique: Fluoroscopy Guidance (Non-Spinal) Indication(s): Fluoroscopy guidance for needle placement to enhance accuracy in procedures requiring precise needle localization for targeted delivery of medication in or near specific anatomical locations not easily accessible without such real-time imaging assistance. Exposure Time: Please see nurses notes. Contrast: Before injecting any contrast, we confirmed that the patient did not have an allergy to iodine, shellfish, or radiological contrast. Once satisfactory needle placement was completed at the desired level, radiological contrast was injected. Contrast injected  under live fluoroscopy. No contrast complications. See chart for type and volume of contrast used. Fluoroscopic Guidance: I was personally present during the use of fluoroscopy. Tunnel Vision Technique used to obtain the best possible view of the target area. Parallax error corrected before commencing the procedure. Direction-depth-direction technique  used to introduce the needle under continuous pulsed fluoroscopy. Once target was reached, antero-posterior, oblique, and lateral fluoroscopic projection used confirm needle placement in all planes. Images permanently stored in EMR. Interpretation: I personally interpreted the imaging intraoperatively. Adequate needle placement confirmed in multiple planes. Appropriate spread of contrast into desired area was observed. No evidence of afferent or efferent intravascular uptake. Permanent images saved into the patient's record.  Antibiotic Prophylaxis:   Anti-infectives (From admission, onward)    None      Indication(s): None identified  Post-operative Assessment:  Post-procedure Vital Signs:  Pulse/HCG Rate: (!) 5162 Temp: (!) 97.2 F (36.2 C) Resp: 18 BP: (!) 191/93 SpO2: 97 %  EBL: None  Complications: No immediate post-treatment complications observed by team, or reported by patient.  Note: The patient tolerated the entire procedure well. A repeat set of vitals were taken after the procedure and the patient was kept under observation following institutional policy, for this type of procedure. Post-procedural neurological assessment was performed, showing return to baseline, prior to discharge. The patient was provided with post-procedure discharge instructions, including a section on how to identify potential problems. Should any problems arise concerning this procedure, the patient was given instructions to immediately contact us , at any time, without hesitation. In any case, we plan to contact the patient by telephone for a follow-up  status report regarding this interventional procedure.  Comments:  No additional relevant information.  Plan of Care (POC)  Orders:  Orders Placed This Encounter  Procedures   Radiofrequency,Genicular    Scheduling Instructions:     Side(s): Right Knee     Level(s): Superior-Lateral, Superior-Medial, and Inferior-Medial Genicular Nerve(s)     Sedation: With Sedation.     Date: 06/19/2024    Where will this procedure be performed?:   [facility]   [facility] C-ARM 1-60 MIN NO REPORT    Intraoperative interpretation by procedural physician at [facility].    Standing Status:   Standing    Number of Occurrences:   1    Reason for exam::   Assistance in needle guidance and placement for procedures requiring needle placement in or near specific anatomical locations not easily accessible without such assistance.   Informed Consent Details: Physician/Practitioner Attestation; Transcribe to consent form and obtain patient signature    Note: Always confirm laterality of pain with Ms. Renie, before procedure. Transcribe to consent form and obtain patient signature.    Physician/Practitioner attestation of informed consent for procedure/surgical case:   I, the physician/practitioner, attest that I have discussed with the patient the benefits, risks, side effects, alternatives, likelihood of achieving goals and potential problems during recovery for the procedure that I have provided informed consent.    Procedure:   Radiofrequency ablation of the genicular nerves of the knee    Physician/Practitioner performing the procedure:   Natanel Snavely A. Tanya, MD    Indication/Reason:   Chronic knee pain     Opioid Analgesic: Suspended (06/17/2024) for patient's safety reasons. No more chronic opioid analgesics therapy prescribed by our practice. Obtaining pain medications from other practitioners w/o approval or consultation.  Patient and husband notified on 06/17/2024. MME/day:  Unknown mg/day.      Medications ordered for procedure: Meds ordered this encounter  Medications   lidocaine  (XYLOCAINE ) 2 % (with pres) injection 400 mg   pentafluoroprop-tetrafluoroeth (GEBAUERS) aerosol   midazolam  (VERSED ) 5 MG/5ML injection 0.5-2 mg    Make sure Flumazenil is available in the pyxis when using this  medication. If oversedation occurs, administer 0.2 mg IV over 15 sec. If after 45 sec no response, administer 0.2 mg again over 1 min; may repeat at 1 min intervals; not to exceed 4 doses (1 mg)   DISCONTD: fentaNYL  (SUBLIMAZE ) injection 25-50 mcg    Make sure Narcan  is available in the pyxis when using this medication. In the event of respiratory depression (RR< 8/min): Titrate NARCAN  (naloxone ) in increments of 0.1 to 0.2 mg IV at 2-3 minute intervals, until desired degree of reversal.   methylPREDNISolone  acetate (DEPO-MEDROL ) injection 80 mg   ropivacaine  (PF) 2 mg/mL (0.2%) (NAROPIN ) injection 9 mL   Medications administered: We administered lidocaine , pentafluoroprop-tetrafluoroeth, midazolam , fentaNYL , methylPREDNISolone  acetate, and ropivacaine  (PF) 2 mg/mL (0.2%).  See the medical record for exact dosing, route, and time of administration.    Interventional Therapies  Risk Factors  Considerations  Medical Comorbidities:  Parkinson's disease  Hx right breast cancer  OIC  SOB  CKD Stage 3b  GERD  depression  osteopenia/osteoporosis  PSVT  Memory impairment      Planned  Pending:   Therapeutic right genicular nerve RFA #1    Under consideration:  Therapeutic right suprascapular nerve RFA #1  Possible intrathecal pump trial and permanent implant    Completed:   Diagnostic right genicular nerve blocks x1 (06/10/2024) (100/100/50/50)  Diagnostic left suprascapular NB x1 (04/17/2024) (10-0/10)  Diagnostic left infraspinatus TPI/MNB x1 (04/01/2024) (0/0/0/0)  Therapeutic bilateral lumbar facet Blk x15 (03/20/2024)  Therapeutic bilateral lumbar facet  RFA x R4L2  Diagnostic/therapeutic right suprascapular NB x2  Therapeutic bilateral trapezius/thoracic TPI x R6L1  Therapeutic right lumbar TPI x1  Therapeutic bilateral SI inj. x R3L2  Therapeutic right SI RFA x4  Therapeutic right knee inj. (steroid x5) (Zilretta  x3) (Monovisc x1) (pes anserine x2) (12/06/2023)  Therapeutic TFESI (bilateral: L5, L2 x1) (right: L5 x4, L4 x1, L3 x1)  Therapeutic LESI (midline: L2-3 x1) (right: L3-4 x2, L4-5 x2, L5-S1 x2)    Therapeutic  Palliative (PRN) options:   Do not schedule any procedures without a proper preprocedure evaluation.   Completed by other providers:   Therapeutic right knee (ORTHOVISC) inj. x3 (05/18/2023, 05/23/2023, 05/30/2023) by Arthea Peggye Sheer, MD [facility]) Therapeutic left knee (Kenalog ) inj. x1 (08/25/2020) by Earnestine Leverette Blanch, MD ([facility])   Pharmacotherapy  Nonopioids transfer 12/21/2020: Gabapentin        Follow-up plan:   Return in about 2 weeks (around 07/03/2024) for PPE F2F .     Recent Visits Date Type Provider Dept  06/19/24 Procedure visit Tanya Glisson, MD [facility]  06/17/24 Office Visit Tanya Glisson, MD [facility]  06/10/24 Procedure visit Tanya Glisson, MD [facility]  05/28/24 Office Visit Tanya Glisson, MD [facility]  05/07/24 Office Visit Tanya Glisson, MD [facility]  04/23/24 Office Visit Tanya Glisson, MD [facility]  04/17/24 Procedure visit Tanya Glisson, MD [facility]  04/01/24 Office Visit Tanya Glisson, MD [facility]  Showing recent visits within past 90 days and meeting all other requirements Today's Visits Date Type Provider Dept  06/30/24 Office Visit Tanya Glisson, MD [facility]  Showing today's visits and meeting all other requirements Future Appointments Date Type Provider Dept  07/08/24 Appointment Tanya Glisson, MD [facility]  Showing future appointments within next 90 days and meeting all other requirements   Disposition: Discharge home  Discharge (Date  Time): 06/19/2024; 1343 hrs.   Primary Care Physician: Glendia Shad, MD  Location: [facility] Note by: Eric DELENA Como, MD (TTS technology used. I apologize for any typographical errors that were not detected and corrected.) Date: 06/19/2024; Time: 2:07 PM  Disclaimer:  Medicine is not an Visual merchandiser. The only guarantee in medicine is that nothing is guaranteed. It is important to note that the decision to proceed with this intervention was based on the information collected from the patient. The Data and conclusions were drawn from the patient's questionnaire, the interview, and the physical examination. Because the information was provided in large part by the patient, it cannot be guaranteed that it has not been purposely or unconsciously manipulated. Every effort has been made to obtain as much relevant data as possible for this evaluation. It is important to note that the conclusions that lead to this procedure are derived in large part from the available data. Always take into account that the treatment will also be dependent on availability of resources and existing treatment guidelines, considered by other Pain Management Practitioners as being common knowledge and practice, at the time of the intervention. For Medico-Legal purposes, it is also important to point out that variation in procedural techniques and pharmacological choices are the acceptable norm. The indications, contraindications, technique, and results of the above procedure should only be interpreted and judged by a Board-Certified Interventional Pain Specialist with extensive familiarity and expertise in the same exact procedure and technique.

## 2024-06-21 ENCOUNTER — Other Ambulatory Visit: Payer: Self-pay | Admitting: Internal Medicine

## 2024-06-21 DIAGNOSIS — E7800 Pure hypercholesterolemia, unspecified: Secondary | ICD-10-CM

## 2024-06-21 DIAGNOSIS — N3020 Other chronic cystitis without hematuria: Secondary | ICD-10-CM

## 2024-06-23 ENCOUNTER — Telehealth: Payer: Self-pay | Admitting: Urology

## 2024-06-23 ENCOUNTER — Other Ambulatory Visit: Payer: Self-pay | Admitting: Family Medicine

## 2024-06-23 DIAGNOSIS — E7800 Pure hypercholesterolemia, unspecified: Secondary | ICD-10-CM

## 2024-06-23 NOTE — Telephone Encounter (Unsigned)
 Copied from CRM [id number]. Topic: Clinical - Medication Refill >> Jun 23, 2024  9:33 AM Ivette P wrote: Medication: rosuvastatin  (CRESTOR ) 20 MG tablet  Has the patient contacted their pharmacy? Yes (Agent: If no, request that the patient contact the pharmacy for the refill. If patient does not wish to contact the pharmacy document the reason why and proceed with request.) (Agent: If yes, when and what did the pharmacy advise?)  This is the patient's preferred pharmacy:  [facility] - [place], [place] - [address] [place] [address] Phone: [phone] Fax: [phone]  Is this the correct pharmacy for this prescription? Yes If no, delete pharmacy and type the correct one.   Has the prescription been filled recently? No  Is the patient out of the medication? Yes  Has the patient been seen for an appointment in the last year OR does the patient have an upcoming appointment? Yes  Can we respond through MyChart? No  Agent: Please be advised that Rx refills may take up to 3 business days. We ask that you follow-up with your pharmacy.

## 2024-06-23 NOTE — Telephone Encounter (Signed)
 mirabegron  ER (MYRBETRIQ ) 50 MG TB24 tablet needs to be refilled. They sent over a request back in June. [employer].

## 2024-06-24 ENCOUNTER — Other Ambulatory Visit: Payer: Self-pay

## 2024-06-24 ENCOUNTER — Telehealth: Payer: Self-pay | Admitting: Pain Medicine

## 2024-06-24 DIAGNOSIS — N3020 Other chronic cystitis without hematuria: Secondary | ICD-10-CM

## 2024-06-24 MED ORDER — MIRABEGRON ER 50 MG PO TB24: 50.0000 mg | ORAL_TABLET | Freq: Every day | ORAL | 0 refills | Status: DC

## 2024-06-24 NOTE — Telephone Encounter (Signed)
 LVM informing pt that we sent in one month's supple of myrbetriq  to her pharmacy.

## 2024-06-24 NOTE — Telephone Encounter (Signed)
 PT call to cancel appt for tomorrow and will like to know if she can be schedule for Thurs for a procedure. Please give patient a call. TY

## 2024-06-24 NOTE — Telephone Encounter (Signed)
 Pt was a no show for appt on 5/12.  I LMOM that she would need to call office to schedule an appt to get med refill.

## 2024-06-24 NOTE — Telephone Encounter (Signed)
 Dr Tanya  will not be doing another procedure. He just recently did a procedure on this patient.

## 2024-06-24 NOTE — Progress Notes (Unsigned)
 PROVIDER NOTE: Interpretation of information contained herein should be left to medically-trained personnel. Specific patient instructions are provided elsewhere under Patient Instructions section of medical record. This document was created in part using AI and STT-dictation technology, any transcriptional errors that may result from this process are unintentional.  Patient: Wanda Martinez  Service: E/M   PCP: Glendia Shad, MD  DOB: 1935/02/14  DOS: 06/25/2024  Provider: Eric DELENA Como, MD  MRN: [id number]  Delivery: Face-to-face  Specialty: Interventional Pain Management  Type: Established Patient  Setting: Ambulatory outpatient facility  Specialty designation: 09  Referring Prov.: Glendia Shad, MD  Location: Outpatient office facility       History of present illness (HPI) [name], a 89 y.o. year old female, is here today because of her Chronic bilateral low back pain without sciatica [M54.50, G89.29]. [name] primary complain today is No chief complaint on file.  Pertinent problems: Ms. Cope has Headache; Chronic low back pain (1ry area of Pain) (Bilateral) (R>L) w/o sciatica; Lumbar spondylosis; Chronic hip pain (Bilateral); Chronic neck pain; Cervical spondylosis; Chronic cervical radicular pain (Right); Diffuse myofascial pain syndrome; Neurogenic pain; Chronic upper back pain (Right); Myofascial pain syndrome (Right) (cervicothoracic); Lumbar facet syndrome (Bilateral) (R>L); History of breast cancer; Cervical facet hypertrophy; Cervical facet syndrome (HCC); Chronic shoulder pain (Right); Chronic pain syndrome; Chronic sacroiliac joint pain (Left); Chronic sacroiliac joint pain (Right); 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, L4-5, L5-S1) (Right); Lumbar spinal stenosis (with neurogenic claudication) (L3-4); Chronic lower extremity pain (Right); Chronic lumbar radicular pain (Right); Trochanteric bursitis of hip (Bilateral); Spondylosis without myelopathy or  radiculopathy, lumbosacral region; Trigger point with back pain (Right); DDD (degenerative disc disease), lumbosacral; Chronic upper extremity pain (Right); Chronic thoracic back pain (Bilateral) (L>R); Trigger point of thoracic region (Bilateral) (L>R); Other specified dorsopathies, sacral and sacrococcygeal region; Sacroiliac joint dysfunction (Right); Bilateral sacroiliitis (HCC); Somatic dysfunction of sacroiliac joint (Right); Chronic musculoskeletal pain; Facial pain; Chronic hip pain (Right); Osteoarthritis of hip (Right); Left ear pain; 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HCC); Abnormal MRI, lumbar spine (03/14/2024); Osteoarthritis involving multiple joints; Other spondylosis, sacral and sacrococcygeal region; Lumbar facet hypertrophy (Multilevel) (Bilateral); Chronic knee pain (Right); Osteoarthritis of knee (Right); Trigger point of shoulder region (Right); Osteoarthritis of AC (acromioclavicular) joint (Right); Osteoarthritis of shoulder (Right); Unspecified injury of muscle(s) and tendon(s) of the rotator cuff of shoulder, sequela (Right); Cervicalgia; DDD (degenerative disc disease), lumbar; Lumbar radicular pain (Bilateral); Lumbar foraminal stenosis (Right: L3-4, L4-5, L5-S1) (Left: L2-3); Abnormal MRI, hip (Right) (05/04/2022); Abnormal MRI, knee (Right) (11/29/2023); Tricompartment osteoarthritis of knee (Right); Chronic low back pain (Right) w/o sciatica; Hoffa's fat pad disease (HCC) (Right); Hoffa's pad syndrome (HCC) (Right); Chronic low back pain (Right) w/ sciatica (Right); Lower extremity weakness (Right); Chronic radicular pain of lower back; Chronic sciatica (Right); Lumbosacral radiculopathy at L5 (Right); Low back pain of over 3 months duration; Personal history of breast cancer; Infrapatellar bursitis of knee (Right); Traumatic ecchymosis of right lower leg; Lumbar facet joint pain; Medial knee pain (Right); Chronic patellofemoral pain of knee (Right); Patellofemoral joint pain (Right); Knee  hemarthrosis (Right); Pes anserinus bursitis of knee (Right); Abnormal MRI, shoulder (Right) (06/21/2023); Wound, open, leg; Chronic hip pain (Left); Osteoarthritis of hips (Bilateral) (R>L); Intractable back pain, acute on chronic; Intermittent low back pain; Trochanteric bursitis of left hip; Sprain of left shoulder, initial encounter; Chronic shoulder pain (Left); Osteoarthritis of AC (acromioclavicular) joint (Left); Arthralgia of acromioclavicular joint (Left); Osteoarthritis of shoulder (Left); Trigger point of shoulder region (Left); Mechanical low  back pain; Multifactorial low back pain; Chronic shoulder pain (Bilateral); and Arthralgia of multiple sites on their pertinent problem list.  Pain Assessment: Severity of   is reported as a  /10. Location:    / . Onset:  . Quality:  . Timing:  . Modifying factor(s):  SABRA Vitals:  vitals were not taken for this visit.  BMI: Estimated body mass index is 26.93 kg/m as calculated from the following:   Height as of 06/19/24: 5' 3 (1.6 m).   Weight as of 06/19/24: 152 lb (68.9 kg).  Last encounter: 06/17/2024. Last procedure: 06/19/2024.  Reason for encounter: evaluation of worsening, or previously known (established) problem.   Discussed the use of AI scribe software for clinical note transcription with the patient, who gave verbal consent to proceed.  History of Present Illness           Pharmacotherapy Assessment   Analgesic: Fentanyl  12.5 mcg/h patch, 1 patch every 3 days (72 hours) (# 5) (last filled on 05/14/2024 and 05/07/2024?) MME/day: 28.8 mg/day.    Monitoring: [place] PMP: PDMP reviewed during this encounter.       Pharmacotherapy: No side-effects or adverse reactions reported. Compliance: No problems identified. Effectiveness: Clinically acceptable.  No notes on file  UDS:  Summary  Date Value Ref Range Status  04/23/2024 FINAL  Final    Comment:    ==================================================================== Compliance Drug  Analysis, Ur ==================================================================== Test                             Result       Flag       Units  Drug Present and Declared for Prescription Verification   Oxycodone                       4533         EXPECTED   ng/mg creat   Noroxycodone                   4070         EXPECTED   ng/mg creat    Sources of oxycodone  include scheduled prescription medications.    Noroxycodone is an expected metabolite of oxycodone .  Drug Present not Declared for Prescription Verification   Gabapentin                      PRESENT      UNEXPECTED   Baclofen                        PRESENT      UNEXPECTED   Citalopram                      PRESENT      UNEXPECTED   Desmethylcitalopram            PRESENT      UNEXPECTED    Desmethylcitalopram is an expected metabolite of citalopram  or the    enantiomeric form, escitalopram.    Acetaminophen                   PRESENT      UNEXPECTED   Lidocaine                       PRESENT      UNEXPECTED   Metoprolol   PRESENT      UNEXPECTED ==================================================================== Test                      Result    Flag   Units      Ref Range   Creatinine              106              mg/dL      >=79 ==================================================================== Declared Medications:  The flagging and interpretation on this report are based on the  following declared medications.  Unexpected results may arise from  inaccuracies in the declared medications.   **Note: The testing scope of this panel includes these medications:   Oxycodone    **Note: The testing scope of this panel does not include the  following reported medications:   Omega-3 Fatty Acids  Pantoprazole  (Protonix )  Polyethylene Glycol (MiraLAX )  Prednisone  (Deltasone )  Rosuvastatin  (Crestor ) ==================================================================== For clinical consultation, please call [phone]. ====================================================================     No results found for: CBDTHCR No results found for: D8THCCBX No results found for: D9THCCBX  ROS  Constitutional: Denies any fever or chills Gastrointestinal: No reported hemesis, hematochezia, vomiting, or acute GI distress Musculoskeletal: Denies any acute onset joint swelling, redness, loss of ROM, or weakness Neurological: No reported episodes of acute onset apraxia, aphasia, dysarthria, agnosia, amnesia, paralysis, loss of coordination, or loss of consciousness  Medication Review  AMBULATORY NON FORMULARY MEDICATION, Glucosamine-Chondroitin, Omega-3, acetaminophen , amLODipine , aspirin EC, calcium  carbonate, citalopram , donepezil , fentaNYL , gabapentin , hydrochlorothiazide , losartan , magnesium  oxide, melatonin, meloxicam , methocarbamol , metoprolol  succinate, mirabegron  ER, multivitamin, naloxone , oxyCODONE , pantoprazole , polyethylene glycol, predniSONE , and rosuvastatin   History Review  Allergy: Ms. Rossmann is allergic to sulfa antibiotics and vesicare [solifenacin]. Drug: Ms. Gentz  reports current drug use. Alcohol:  reports no history of alcohol use. Tobacco:  reports that she has never smoked. She has never been exposed to tobacco smoke. She has never used smokeless tobacco. Social: Ms. Vessel  reports that she has never smoked. She has never been exposed to tobacco smoke. She has never used smokeless tobacco. She reports current drug use. She reports that she does not drink alcohol. Medical:  has a past medical history of Acute postoperative pain (10/25/2017), Anemia, Arm pain (07/26/2015), Arthritis, Arthritis, degenerative (03/26/2014), Back pain (11/01/2013), Bladder infection (06/2018), Breast cancer (HCC), Breast cancer (HCC), CHEST PAIN (04/29/2010), Chronic cystitis, Cystocele (02/22/2013), Cystocele, midline (08/19/2013), Degeneration of intervertebral disc of lumbosacral region (03/26/2014), DYSPNEA  (04/29/2010), Enthesopathy of hip (03/26/2014), GERD (gastroesophageal reflux disease), Hiatal hernia, HTN (hypertension), Hypokalemia (06/2018), Hyponatremia (06/2018), LBP (low back pain) (03/26/2014), Neck pain (11/01/2013), Parkinson disease (HCC), Pneumonia (06/2018), Sinusitis (02/07/2015), Skin lesions (07/12/2014), and Urinary incontinence. Surgical: Ms. Bunney  has a past surgical history that includes Tonsillectomy and adenoidectomy (1979); Vesicovaginal fistula closure w/ TAH (1983); Breast surgery (1986); Breast enhancement surgery (1987); Breast implant removal; Breast implant removal (Right, 08/29/2012); Mastectomy (08/2012); Abdominal hysterectomy; Colonoscopy with propofol  (N/A, 09/13/2016); Breast biopsy (2013); and Blepharoplasty. Family: family history includes Colon polyps in her father; Diabetes in her father; Parkinson's disease in her mother; Stroke in her father and mother.  Laboratory Chemistry Profile   Renal Lab Results  Component Value Date   BUN 43 (H) 04/24/2024   CREATININE 0.96 04/24/2024   BCR 38 (H) 04/23/2024   GFR 46.06 (L) 02/18/2024   GFRAA >60 07/27/2020   GFRNONAA 58 (L) 04/24/2024    Hepatic Lab Results  Component Value Date   AST 22 04/24/2024  ALT 20 04/24/2024   ALBUMIN 3.1 (L) 04/24/2024   ALKPHOS 26 (L) 04/24/2024    Electrolytes Lab Results  Component Value Date   NA 132 (L) 04/24/2024   K 3.7 04/24/2024   CL 98 04/24/2024   CALCIUM  8.8 (L) 04/24/2024   MG 2.4 (H) 04/23/2024    Bone Lab Results  Component Value Date   VD25OH 66.95 09/26/2022   25OHVITD1 61 04/23/2024   25OHVITD2 <1.0 04/23/2024   25OHVITD3 60 04/23/2024    Inflammation (CRP: Acute Phase) (ESR: Chronic Phase) Lab Results  Component Value Date   CRP 27 (H) 04/23/2024   ESRSEDRATE 15 04/23/2024         Note: Above Lab results reviewed.  Recent Imaging Review  DG PAIN CLINIC C-ARM 1-60 MIN NO REPORT Fluoro was used, but no Radiologist interpretation will be  provided.  Please refer to NOTES tab for provider progress note. Note: Reviewed        Physical Exam  Vitals: LMP 12/18/1981  BMI: Estimated body mass index is 26.93 kg/m as calculated from the following:   Height as of 06/19/24: 5' 3 (1.6 m).   Weight as of 06/19/24: 152 lb (68.9 kg). Ideal: Ideal body weight: 52.4 kg (115 lb 8.3 oz) Adjusted ideal body weight: 59 kg (130 lb 1.8 oz) General appearance: Well nourished, well developed, and well hydrated. In no apparent acute distress Mental status: Alert, oriented x 3 (person, place, & time)       Respiratory: No evidence of acute respiratory distress Eyes: PERLA   Assessment   Diagnosis Status  1. Chronic low back pain (1ry area of Pain) (Bilateral) (R>L) w/o sciatica   2. Lumbar facet joint pain   3. Lumbar facet hypertrophy (Multilevel) (Bilateral)   4. Lumbar facet syndrome (Bilateral) (R>L)   5. Spondylosis without myelopathy or radiculopathy, lumbosacral region    Controlled Controlled Controlled   Updated Problems: No problems updated.  Plan of Care  Problem-specific:  Assessment and Plan            Ms. Georgian Mcclory has a current medication list which includes the following long-term medication(s): amlodipine , calcium  carbonate, citalopram , citalopram , donepezil , fentanyl , fentanyl , gabapentin , hydrochlorothiazide , losartan , magnesium  oxide, metoprolol  succinate, mirabegron  er, oxycodone , oxycodone , pantoprazole , and rosuvastatin .  Pharmacotherapy (Medications Ordered): No orders of the defined types were placed in this encounter.  Orders:  No orders of the defined types were placed in this encounter.    Interventional Therapies  Risk Factors  Considerations  Medical Comorbidities:  Parkinson's disease  Hx right breast cancer  OIC  SOB  CKD Stage 3b  GERD  depression  osteopenia/osteoporosis  PSVT  Memory impairment      Planned  Pending:   Therapeutic right genicular nerve RFA #1     Under consideration:  Therapeutic right suprascapular nerve RFA #1  Possible intrathecal pump trial and permanent implant    Completed:   Diagnostic right genicular nerve blocks x1 (06/10/2024) (100/100/50/50)  Diagnostic left suprascapular NB x1 (04/17/2024) (10-0/10)  Diagnostic left infraspinatus TPI/MNB x1 (04/01/2024) (0/0/0/0)  Therapeutic bilateral lumbar facet Blk x15 (03/20/2024)  Therapeutic bilateral lumbar facet RFA x R4L2  Diagnostic/therapeutic right suprascapular NB x2  Therapeutic bilateral trapezius/thoracic TPI x R6L1  Therapeutic right lumbar TPI x1  Therapeutic bilateral SI inj. x R3L2  Therapeutic right SI RFA x4  Therapeutic right knee inj. (steroid x5) (Zilretta  x3) (Monovisc x1) (pes anserine x2) (12/06/2023)  Therapeutic TFESI (bilateral: L5, L2 x1) (right: L5 x4, L4  x1, L3 x1)  Therapeutic LESI (midline: L2-3 x1) (right: L3-4 x2, L4-5 x2, L5-S1 x2)    Therapeutic  Palliative (PRN) options:   Do not schedule any procedures without a proper preprocedure evaluation.   Completed by other providers:   Therapeutic right knee (ORTHOVISC) inj. x3 (05/18/2023, 05/23/2023, 05/30/2023) by Arthea Peggye Sheer, MD [facility]) Therapeutic left knee (Kenalog ) inj. x1 (08/25/2020) by Earnestine Leverette Blanch, MD ([facility])   Pharmacotherapy  Nonopioids transfer 12/21/2020: Gabapentin       No follow-ups on file.    Recent Visits Date Type Provider Dept  06/19/24 Procedure visit Tanya Glisson, MD [facility]  06/17/24 Office Visit Tanya Glisson, MD [facility]  06/10/24 Procedure visit Tanya Glisson, MD [facility]  05/28/24 Office Visit Tanya Glisson, MD [facility]  05/07/24 Office Visit Tanya Glisson, MD [facility]  04/23/24 Office Visit Tanya Glisson, MD [facility]  04/17/24 Procedure visit Tanya Glisson, MD [facility]  04/01/24 Office Visit Tanya Glisson,  MD [facility]  03/26/24 Office Visit Tanya Glisson, MD [facility]  Showing recent visits within past 90 days and meeting all other requirements Future Appointments Date Type Provider Dept  06/25/24 Appointment Tanya Glisson, MD [facility]  07/08/24 Appointment Tanya Glisson, MD [facility]  Showing future appointments within next 90 days and meeting all other requirements  I discussed the assessment and treatment plan with the patient. The patient was provided an opportunity to ask questions and all were answered. The patient agreed with the plan and demonstrated an understanding of the instructions.  Patient advised to call back or seek an in-person evaluation if the symptoms or condition worsens.  Duration of encounter: *** minutes.  Total time on encounter, as per AMA guidelines included both the face-to-face and non-face-to-face time personally spent by the physician and/or other qualified health care professional(s) on the day of the encounter (includes time in activities that require the physician or other qualified health care professional and does not include time in activities normally performed by clinical staff). Physician's time may include the following activities when performed: Preparing to see the patient (e.g., pre-charting review of records, searching for previously ordered imaging, lab work, and nerve conduction tests) Review of prior analgesic pharmacotherapies. Reviewing PMP Interpreting ordered tests (e.g., lab work, imaging, nerve conduction tests) Performing post-procedure evaluations, including interpretation of diagnostic procedures Obtaining and/or reviewing separately obtained history Performing a medically appropriate examination and/or evaluation Counseling and educating the patient/family/caregiver Ordering medications, tests, or procedures Referring and communicating with other health care professionals  (when not separately reported) Documenting clinical information in the electronic or other health record Independently interpreting results (not separately reported) and communicating results to the patient/ family/caregiver Care coordination (not separately reported)  Note by: Glisson DELENA Tanya, MD (TTS and AI technology used. I apologize for any typographical errors that were not detected and corrected.) Date: 06/25/2024; Time: 8:21 AM

## 2024-06-24 NOTE — Telephone Encounter (Signed)
 PT called back and scheduled appt w/Sam on 7/29.  She asked if we could send in a 1 month supply of Myrbetriq  to last until her appt.

## 2024-06-25 ENCOUNTER — Ambulatory Visit (HOSPITAL_BASED_OUTPATIENT_CLINIC_OR_DEPARTMENT_OTHER): Admitting: Pain Medicine

## 2024-06-25 DIAGNOSIS — G8929 Other chronic pain: Secondary | ICD-10-CM

## 2024-06-25 DIAGNOSIS — Z91199 Patient's noncompliance with other medical treatment and regimen due to unspecified reason: Secondary | ICD-10-CM

## 2024-06-25 DIAGNOSIS — M47816 Spondylosis without myelopathy or radiculopathy, lumbar region: Secondary | ICD-10-CM

## 2024-06-25 DIAGNOSIS — Z09 Encounter for follow-up examination after completed treatment for conditions other than malignant neoplasm: Secondary | ICD-10-CM

## 2024-06-25 DIAGNOSIS — M5459 Other low back pain: Secondary | ICD-10-CM

## 2024-06-25 DIAGNOSIS — M47817 Spondylosis without myelopathy or radiculopathy, lumbosacral region: Secondary | ICD-10-CM

## 2024-06-25 DIAGNOSIS — M545 Low back pain, unspecified: Secondary | ICD-10-CM

## 2024-06-25 MED ORDER — ROSUVASTATIN CALCIUM 20 MG PO TABS: 20.0000 mg | ORAL_TABLET | Freq: Every day | ORAL | 3 refills | Status: DC

## 2024-06-25 NOTE — Telephone Encounter (Signed)
 Please refuse. These medications were just refilled by you and urology.

## 2024-06-29 NOTE — Patient Instructions (Incomplete)

## 2024-06-29 NOTE — Progress Notes (Unsigned)
 PROVIDER NOTE: Interpretation of information contained herein should be left to medically-trained personnel. Specific patient instructions are provided elsewhere under Patient Instructions section of medical record. This document was created in part using AI and STT-dictation technology, any transcriptional errors that may result from this process are unintentional.  Patient: Wanda Martinez  Service: E/M   PCP: Glendia Shad, MD  DOB: 1935-09-08  DOS: 06/30/2024  Provider: Eric DELENA Como, MD  MRN: [id number]  Delivery: Face-to-face  Specialty: Interventional Pain Management  Type: Established Patient  Setting: Ambulatory outpatient facility  Specialty designation: 09  Referring Prov.: Glendia Shad, MD  Location: Outpatient office facility       History of present illness (HPI) [name], a 89 y.o. year old female, is here today because of her Chronic bilateral low back pain without sciatica [M54.50, G89.29]. [name] primary complain today is No chief complaint on file.  Pertinent problems: [name] has Headache; Chronic low back pain (1ry area of Pain) (Bilateral) (R>L) w/o sciatica; Lumbar spondylosis; Chronic hip pain (Bilateral); Chronic neck pain; Cervical spondylosis; Chronic cervical radicular pain (Right); Diffuse myofascial pain syndrome; Neurogenic pain; Chronic upper back pain (Right); Myofascial pain syndrome (Right) (cervicothoracic); Lumbar facet syndrome (Bilateral) (R>L); History of breast cancer; Cervical facet hypertrophy; Cervical facet syndrome (HCC); Chronic shoulder pain (Right); Chronic pain syndrome; Chronic sacroiliac joint pain (Left); Chronic sacroiliac joint pain (Right); 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, L4-5, L5-S1) (Right); Lumbar spinal stenosis (with neurogenic claudication) (L3-4); Chronic lower extremity pain (Right); Chronic lumbar radicular pain (Right); Trochanteric bursitis of hip (Bilateral); Spondylosis without myelopathy or  radiculopathy, lumbosacral region; Trigger point with back pain (Right); DDD (degenerative disc disease), lumbosacral; Chronic upper extremity pain (Right); Chronic thoracic back pain (Bilateral) (L>R); Trigger point of thoracic region (Bilateral) (L>R); Other specified dorsopathies, sacral and sacrococcygeal region; Sacroiliac joint dysfunction (Right); Bilateral sacroiliitis (HCC); Somatic dysfunction of sacroiliac joint (Right); Chronic musculoskeletal pain; Facial pain; Chronic hip pain (Right); Osteoarthritis of hip (Right); Left ear pain; 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HCC); Abnormal MRI, lumbar spine (03/14/2024); Osteoarthritis involving multiple joints; Other spondylosis, sacral and sacrococcygeal region; Lumbar facet hypertrophy (Multilevel) (Bilateral); Chronic knee pain (Right); Osteoarthritis of knee (Right); Trigger point of shoulder region (Right); Osteoarthritis of AC (acromioclavicular) joint (Right); Osteoarthritis of shoulder (Right); Unspecified injury of muscle(s) and tendon(s) of the rotator cuff of shoulder, sequela (Right); Cervicalgia; DDD (degenerative disc disease), lumbar; Lumbar radicular pain (Bilateral); Lumbar foraminal stenosis (Right: L3-4, L4-5, L5-S1) (Left: L2-3); Abnormal MRI, hip (Right) (05/04/2022); Abnormal MRI, knee (Right) (11/29/2023); Tricompartment osteoarthritis of knee (Right); Chronic low back pain (Right) w/o sciatica; Hoffa's fat pad disease (HCC) (Right); Hoffa's pad syndrome (HCC) (Right); Chronic low back pain (Right) w/ sciatica (Right); Lower extremity weakness (Right); Chronic radicular pain of lower back; Chronic sciatica (Right); Lumbosacral radiculopathy at L5 (Right); Low back pain of over 3 months duration; Personal history of breast cancer; Infrapatellar bursitis of knee (Right); Traumatic ecchymosis of right lower leg; Lumbar facet joint pain; Medial knee pain (Right); Chronic patellofemoral pain of knee (Right); Patellofemoral joint pain (Right); Knee  hemarthrosis (Right); Pes anserinus bursitis of knee (Right); Abnormal MRI, shoulder (Right) (06/21/2023); Wound, open, leg; Chronic hip pain (Left); Osteoarthritis of hips (Bilateral) (R>L); Intractable back pain, acute on chronic; Intermittent low back pain; Trochanteric bursitis of left hip; Sprain of left shoulder, initial encounter; Chronic shoulder pain (Left); Osteoarthritis of AC (acromioclavicular) joint (Left); Arthralgia of acromioclavicular joint (Left); Osteoarthritis of shoulder (Left); Trigger point of shoulder region (Left); Mechanical low  back pain; Multifactorial low back pain; Chronic shoulder pain (Bilateral); and Arthralgia of multiple sites on their pertinent problem list.  Pain Assessment: Severity of   is reported as a  /10. Location:    / . Onset:  . Quality:  . Timing:  . Modifying factor(s):  SABRA Vitals:  vitals were not taken for this visit.  BMI: Estimated body mass index is 26.93 kg/m as calculated from the following:   Height as of 06/19/24: 5' 3 (1.6 m).   Weight as of 06/19/24: 152 lb (68.9 kg).  Last encounter: 06/25/2024. Last procedure: 06/19/2024.  Reason for encounter: evaluation of worsening, or previously known (established) problem.   Discussed the use of AI scribe software for clinical note transcription with the patient, who gave verbal consent to proceed.  History of Present Illness          Post-Procedure Evaluation    Procedure:          Anesthesia, Analgesia, Anxiolysis:  Type: Therapeutic Superolateral, Superomedial, and Inferomedial, Genicular Nerve Radiofrequency Ablation (destruction).   #1  Region: Lateral, Anterior, and Medial aspects of the knee joint, above and below the knee joint proper. Level: Superior and inferior to the knee joint. Laterality: Right  Anesthesia: Local (1-2% Lidocaine )  Anxiolysis: IV Versed   Sedation: Moderate  Guidance: Fluoroscopy Non-spinal ([id number])   Position: Supine   Indications: 1. Chronic knee pain  (Right)   2. Medial knee pain (Right)   3. Osteoarthritis of knee (Right)   4. Hoffa's fat pad disease (HCC) (Right)   5. Hoffa's pad syndrome (HCC) (Right)   6. Pes anserinus bursitis of right knee   7. Tricompartment osteoarthritis of knee (Right)   8. Abnormal MRI, knee (Right) (11/29/2023)    [name] has been dealing with the above chronic pain for longer than three months and has either failed to respond, was unable to tolerate, or simply did not get enough benefit from other more conservative therapies including, but not limited to: 1. Over-the-counter medications 2. Anti-inflammatory medications 3. Muscle relaxants 4. Membrane stabilizers 5. Opioids 6. Physical therapy and/or chiropractic manipulation 7. Modalities (Heat, ice, etc.) 8. Invasive techniques such as nerve blocks. [name] has attained more than 50% relief of the pain from a series of diagnostic injections conducted in separate occasions.  Pain Score: Pre-procedure: 8 /10 Post-procedure: 0-No pain/10    Effectiveness:  Initial hour after procedure:   ***. Subsequent 4-6 hours post-procedure:   ***. Analgesia past initial 6 hours:   ***. Ongoing improvement:  Analgesic:  *** Function:    ***    ROM:    ***     Pharmacotherapy Assessment   Opioid Analgesic: Suspended (06/17/2024) for patient's safety reasons. No more chronic opioid analgesics therapy prescribed by our practice. Obtaining pain medications from other practitioners w/o approval or consultation.  Patient and husband notified on 06/17/2024. MME/day: Unknown mg/day.     Monitoring: [place] PMP: PDMP reviewed during this encounter.       Pharmacotherapy: No side-effects or adverse reactions reported. Compliance: No problems identified. Effectiveness: Clinically acceptable.  No notes on file  UDS:  Summary  Date Value Ref Range Status  04/23/2024 FINAL  Final    Comment:     ==================================================================== Compliance Drug Analysis, Ur ==================================================================== Test                             Result       Flag  Units  Drug Present and Declared for Prescription Verification   Oxycodone                       4533         EXPECTED   ng/mg creat   Noroxycodone                   4070         EXPECTED   ng/mg creat    Sources of oxycodone  include scheduled prescription medications.    Noroxycodone is an expected metabolite of oxycodone .  Drug Present not Declared for Prescription Verification   Gabapentin                      PRESENT      UNEXPECTED   Baclofen                        PRESENT      UNEXPECTED   Citalopram                      PRESENT      UNEXPECTED   Desmethylcitalopram            PRESENT      UNEXPECTED    Desmethylcitalopram is an expected metabolite of citalopram  or the    enantiomeric form, escitalopram.    Acetaminophen                   PRESENT      UNEXPECTED   Lidocaine                       PRESENT      UNEXPECTED   Metoprolol                      PRESENT      UNEXPECTED ==================================================================== Test                      Result    Flag   Units      Ref Range   Creatinine              106              mg/dL      >=79 ==================================================================== Declared Medications:  The flagging and interpretation on this report are based on the  following declared medications.  Unexpected results may arise from  inaccuracies in the declared medications.   **Note: The testing scope of this panel includes these medications:   Oxycodone    **Note: The testing scope of this panel does not include the  following reported medications:   Omega-3 Fatty Acids  Pantoprazole  (Protonix )  Polyethylene Glycol (MiraLAX )  Prednisone  (Deltasone )  Rosuvastatin   (Crestor ) ==================================================================== For clinical consultation, please call [phone]. ====================================================================     No results found for: CBDTHCR No results found for: D8THCCBX No results found for: D9THCCBX  ROS  Constitutional: Denies any fever or chills Gastrointestinal: No reported hemesis, hematochezia, vomiting, or acute GI distress Musculoskeletal: Denies any acute onset joint swelling, redness, loss of ROM, or weakness Neurological: No reported episodes of acute onset apraxia, aphasia, dysarthria, agnosia, amnesia, paralysis, loss of coordination, or loss of consciousness  Medication Review  AMBULATORY NON FORMULARY MEDICATION, Glucosamine-Chondroitin, Omega-3, acetaminophen , amLODipine , aspirin EC, calcium  carbonate, citalopram , donepezil , fentaNYL , gabapentin , hydrochlorothiazide , losartan , magnesium  oxide, melatonin, meloxicam , methocarbamol , metoprolol  succinate, mirabegron  ER, multivitamin, naloxone ,  oxyCODONE , pantoprazole , polyethylene glycol, predniSONE , and rosuvastatin   History Review  Allergy: [name] is allergic to sulfa antibiotics and vesicare [solifenacin]. Drug: [name]  reports current drug use. Alcohol:  reports no history of alcohol use. Tobacco:  reports that she has never smoked. She has never been exposed to tobacco smoke. She has never used smokeless tobacco. Social: [name]  reports that she has never smoked. She has never been exposed to tobacco smoke. She has never used smokeless tobacco. She reports current drug use. She reports that she does not drink alcohol. Medical:  has a past medical history of Acute postoperative pain (10/25/2017), Anemia, Arm pain (07/26/2015), Arthritis, Arthritis, degenerative (03/26/2014), Back pain (11/01/2013), Bladder infection (06/2018), Breast cancer (HCC), Breast cancer (HCC), CHEST PAIN (04/29/2010), Chronic cystitis,  Cystocele (02/22/2013), Cystocele, midline (08/19/2013), Degeneration of intervertebral disc of lumbosacral region (03/26/2014), DYSPNEA (04/29/2010), Enthesopathy of hip (03/26/2014), GERD (gastroesophageal reflux disease), Hiatal hernia, HTN (hypertension), Hypokalemia (06/2018), Hyponatremia (06/2018), LBP (low back pain) (03/26/2014), Neck pain (11/01/2013), Parkinson disease (HCC), Pneumonia (06/2018), Sinusitis (02/07/2015), Skin lesions (07/12/2014), and Urinary incontinence. Surgical: [name]  has a past surgical history that includes Tonsillectomy and adenoidectomy (1979); Vesicovaginal fistula closure w/ TAH (1983); Breast surgery (1986); Breast enhancement surgery (1987); Breast implant removal; Breast implant removal (Right, 08/29/2012); Mastectomy (08/2012); Abdominal hysterectomy; Colonoscopy with propofol  (N/A, 09/13/2016); Breast biopsy (2013); and Blepharoplasty. Family: family history includes Colon polyps in her father; Diabetes in her father; Parkinson's disease in her mother; Stroke in her father and mother.  Laboratory Chemistry Profile   Renal Lab Results  Component Value Date   BUN 43 (H) 04/24/2024   CREATININE 0.96 04/24/2024   BCR 38 (H) 04/23/2024   GFR 46.06 (L) 02/18/2024   GFRAA >60 07/27/2020   GFRNONAA 58 (L) 04/24/2024    Hepatic Lab Results  Component Value Date   AST 22 04/24/2024   ALT 20 04/24/2024   ALBUMIN 3.1 (L) 04/24/2024   ALKPHOS 26 (L) 04/24/2024    Electrolytes Lab Results  Component Value Date   NA 132 (L) 04/24/2024   K 3.7 04/24/2024   CL 98 04/24/2024   CALCIUM  8.8 (L) 04/24/2024   MG 2.4 (H) 04/23/2024    Bone Lab Results  Component Value Date   VD25OH 66.95 09/26/2022   25OHVITD1 61 04/23/2024   25OHVITD2 <1.0 04/23/2024   25OHVITD3 60 04/23/2024    Inflammation (CRP: Acute Phase) (ESR: Chronic Phase) Lab Results  Component Value Date   CRP 27 (H) 04/23/2024   ESRSEDRATE 15 04/23/2024         Note: Above Lab results  reviewed.  Recent Imaging Review  DG PAIN CLINIC C-ARM 1-60 MIN NO REPORT Fluoro was used, but no Radiologist interpretation will be provided.  Please refer to NOTES tab for provider progress note. Note: Reviewed        Physical Exam  Vitals: LMP 12/18/1981  BMI: Estimated body mass index is 26.93 kg/m as calculated from the following:   Height as of 06/19/24: 5' 3 (1.6 m).   Weight as of 06/19/24: 152 lb (68.9 kg). Ideal: Ideal body weight: 52.4 kg (115 lb 8.3 oz) Adjusted ideal body weight: 59 kg (130 lb 1.8 oz) General appearance: Well nourished, well developed, and well hydrated. In no apparent acute distress Mental status: Alert, oriented x 3 (person, place, & time)       Respiratory: No evidence of acute respiratory distress Eyes: PERLA   Assessment   Diagnosis Status  1. Chronic  low back pain (1ry area of Pain) (Bilateral) (R>L) w/o sciatica   2. Chronic low back pain (Right) w/ sciatica (Right)   3. Intermittent low back pain   4. Low back pain of over 3 months duration   5. Multifactorial low back pain   6. Chronic knee pain (Right)   7. Medial knee pain (Right)   8. Postop check    Controlled Controlled Controlled   Updated Problems: No problems updated.  Plan of Care  Problem-specific:  Assessment and Plan            [name] has a current medication list which includes the following long-term medication(s): amlodipine , calcium  carbonate, citalopram , citalopram , donepezil , fentanyl , fentanyl , gabapentin , hydrochlorothiazide , losartan , magnesium  oxide, metoprolol  succinate, mirabegron  er, oxycodone , oxycodone , pantoprazole , and rosuvastatin .  Pharmacotherapy (Medications Ordered): No orders of the defined types were placed in this encounter.  Orders:  No orders of the defined types were placed in this encounter.    Interventional Therapies  Risk Factors  Considerations  Medical Comorbidities:  Parkinson's disease  Hx right breast  cancer  OIC  SOB  CKD Stage 3b  GERD  depression  osteopenia/osteoporosis  PSVT  Memory impairment      Planned  Pending:   Therapeutic right genicular nerve RFA #1    Under consideration:  Therapeutic right suprascapular nerve RFA #1  Possible intrathecal pump trial and permanent implant    Completed:   Diagnostic right genicular nerve blocks x1 (06/10/2024) (100/100/50/50)  Diagnostic left suprascapular NB x1 (04/17/2024) (10-0/10)  Diagnostic left infraspinatus TPI/MNB x1 (04/01/2024) (0/0/0/0)  Therapeutic bilateral lumbar facet Blk x15 (03/20/2024)  Therapeutic bilateral lumbar facet RFA x R4L2  Diagnostic/therapeutic right suprascapular NB x2  Therapeutic bilateral trapezius/thoracic TPI x R6L1  Therapeutic right lumbar TPI x1  Therapeutic bilateral SI inj. x R3L2  Therapeutic right SI RFA x4  Therapeutic right knee inj. (steroid x5) (Zilretta  x3) (Monovisc x1) (pes anserine x2) (12/06/2023)  Therapeutic TFESI (bilateral: L5, L2 x1) (right: L5 x4, L4 x1, L3 x1)  Therapeutic LESI (midline: L2-3 x1) (right: L3-4 x2, L4-5 x2, L5-S1 x2)    Therapeutic  Palliative (PRN) options:   Do not schedule any procedures without a proper preprocedure evaluation.   Completed by other providers:   Therapeutic right knee (ORTHOVISC) inj. x3 (05/18/2023, 05/23/2023, 05/30/2023) by Arthea Peggye Sheer, MD [facility]) Therapeutic left knee (Kenalog ) inj. x1 (08/25/2020) by Earnestine Leverette Blanch, MD ([facility])   Pharmacotherapy  Nonopioids transfer 12/21/2020: Gabapentin       No follow-ups on file.    Recent Visits Date Type Provider Dept  06/19/24 Procedure visit Tanya Glisson, MD [facility]  06/17/24 Office Visit Tanya Glisson, MD [facility]  06/10/24 Procedure visit Tanya Glisson, MD [facility]  05/28/24 Office Visit Tanya Glisson, MD [facility]  05/07/24 Office Visit Tanya Glisson, MD [facility]   04/23/24 Office Visit Tanya Glisson, MD [facility]  04/17/24 Procedure visit Tanya Glisson, MD [facility]  04/01/24 Office Visit Tanya Glisson, MD [facility]  Showing recent visits within past 90 days and meeting all other requirements Future Appointments Date Type Provider Dept  06/30/24 Appointment Tanya Glisson, MD [facility]  07/08/24 Appointment Tanya Glisson, MD [facility]  Showing future appointments within next 90 days and meeting all other requirements  I discussed the assessment and treatment plan with the patient. The patient was provided an opportunity to ask questions and all  were answered. The patient agreed with the plan and demonstrated an understanding of the instructions.  Patient advised to call back or seek an in-person evaluation if the symptoms or condition worsens.  Duration of encounter: *** minutes.  Total time on encounter, as per AMA guidelines included both the face-to-face and non-face-to-face time personally spent by the physician and/or other qualified health care professional(s) on the day of the encounter (includes time in activities that require the physician or other qualified health care professional and does not include time in activities normally performed by clinical staff). Physician's time may include the following activities when performed: Preparing to see the patient (e.g., pre-charting review of records, searching for previously ordered imaging, lab work, and nerve conduction tests) Review of prior analgesic pharmacotherapies. Reviewing PMP Interpreting ordered tests (e.g., lab work, imaging, nerve conduction tests) Performing post-procedure evaluations, including interpretation of diagnostic procedures Obtaining and/or reviewing separately obtained history Performing a medically appropriate examination and/or evaluation Counseling and educating the  patient/family/caregiver Ordering medications, tests, or procedures Referring and communicating with other health care professionals (when not separately reported) Documenting clinical information in the electronic or other health record Independently interpreting results (not separately reported) and communicating results to the patient/ family/caregiver Care coordination (not separately reported)  Note by: Eric DELENA Como, MD (TTS and AI technology used. I apologize for any typographical errors that were not detected and corrected.) Date: 06/30/2024; Time: 6:48 PM

## 2024-06-30 ENCOUNTER — Ambulatory Visit: Attending: Pain Medicine | Admitting: Pain Medicine

## 2024-06-30 VITALS — BP 118/82 | HR 56 | Temp 96.3°F | Resp 16 | Ht 63.0 in | Wt 150.0 lb

## 2024-06-30 DIAGNOSIS — M545 Low back pain, unspecified: Secondary | ICD-10-CM | POA: Diagnosis not present

## 2024-06-30 DIAGNOSIS — M9904 Segmental and somatic dysfunction of sacral region: Secondary | ICD-10-CM

## 2024-06-30 DIAGNOSIS — R52 Pain, unspecified: Secondary | ICD-10-CM | POA: Diagnosis not present

## 2024-06-30 DIAGNOSIS — M2550 Pain in unspecified joint: Secondary | ICD-10-CM | POA: Diagnosis not present

## 2024-06-30 DIAGNOSIS — G8929 Other chronic pain: Secondary | ICD-10-CM | POA: Diagnosis not present

## 2024-06-30 DIAGNOSIS — M47898 Other spondylosis, sacral and sacrococcygeal region: Secondary | ICD-10-CM | POA: Diagnosis not present

## 2024-06-30 DIAGNOSIS — M159 Polyosteoarthritis, unspecified: Secondary | ICD-10-CM

## 2024-06-30 DIAGNOSIS — Z09 Encounter for follow-up examination after completed treatment for conditions other than malignant neoplasm: Secondary | ICD-10-CM | POA: Diagnosis not present

## 2024-06-30 DIAGNOSIS — M533 Sacrococcygeal disorders, not elsewhere classified: Secondary | ICD-10-CM

## 2024-06-30 DIAGNOSIS — M25561 Pain in right knee: Secondary | ICD-10-CM | POA: Diagnosis not present

## 2024-06-30 NOTE — Progress Notes (Signed)
 Safety precautions to be maintained throughout the outpatient stay will include: orient to surroundings, keep bed in low position, maintain call bell within reach at all times, provide assistance with transfer out of bed and ambulation.

## 2024-07-03 ENCOUNTER — Ambulatory Visit
Admission: RE | Admit: 2024-07-03 | Discharge: 2024-07-03 | Disposition: A | Discharge status: Home / Self Care | Source: Ambulatory Visit | Attending: Pain Medicine | Admitting: Pain Medicine

## 2024-07-03 ENCOUNTER — Encounter: Payer: Self-pay | Admitting: Pain Medicine

## 2024-07-03 ENCOUNTER — Ambulatory Visit (HOSPITAL_BASED_OUTPATIENT_CLINIC_OR_DEPARTMENT_OTHER): Admitting: Pain Medicine

## 2024-07-03 VITALS — BP 120/76 | HR 53 | Temp 97.3°F | Resp 17 | Ht 63.0 in | Wt 149.0 lb

## 2024-07-03 DIAGNOSIS — M461 Sacroiliitis, not elsewhere classified: Secondary | ICD-10-CM | POA: Insufficient documentation

## 2024-07-03 DIAGNOSIS — M5459 Other low back pain: Secondary | ICD-10-CM | POA: Insufficient documentation

## 2024-07-03 DIAGNOSIS — M5388 Other specified dorsopathies, sacral and sacrococcygeal region: Secondary | ICD-10-CM

## 2024-07-03 DIAGNOSIS — Z79899 Other long term (current) drug therapy: Secondary | ICD-10-CM | POA: Diagnosis not present

## 2024-07-03 DIAGNOSIS — G8929 Other chronic pain: Secondary | ICD-10-CM

## 2024-07-03 DIAGNOSIS — M9904 Segmental and somatic dysfunction of sacral region: Secondary | ICD-10-CM | POA: Insufficient documentation

## 2024-07-03 DIAGNOSIS — M545 Low back pain, unspecified: Secondary | ICD-10-CM | POA: Insufficient documentation

## 2024-07-03 DIAGNOSIS — Z5189 Encounter for other specified aftercare: Secondary | ICD-10-CM

## 2024-07-03 DIAGNOSIS — M533 Sacrococcygeal disorders, not elsewhere classified: Secondary | ICD-10-CM

## 2024-07-03 MED ORDER — METHYLPREDNISOLONE ACETATE 80 MG/ML IJ SUSP
80.0000 mg | Freq: Once | INTRAMUSCULAR | Status: AC
  Administered 2024-07-03: 80 mg via INTRA_ARTICULAR
  Filled 2024-07-03: qty 1

## 2024-07-03 MED ORDER — PENTAFLUOROPROP-TETRAFLUOROETH EX AERO
INHALATION_SPRAY | Freq: Once | CUTANEOUS | Status: AC
  Administered 2024-07-03: 30 via TOPICAL

## 2024-07-03 MED ORDER — MIDAZOLAM HCL 2 MG/2ML IJ SOLN
0.5000 mg | Freq: Once | INTRAMUSCULAR | Status: AC
  Administered 2024-07-03: 2 mg via INTRAVENOUS
  Filled 2024-07-03: qty 2

## 2024-07-03 MED ORDER — LIDOCAINE HCL 2 % IJ SOLN
20.0000 mL | Freq: Once | INTRAMUSCULAR | Status: AC
  Administered 2024-07-03: 400 mg
  Filled 2024-07-03: qty 20

## 2024-07-03 MED ORDER — ROPIVACAINE HCL 2 MG/ML IJ SOLN
9.0000 mL | Freq: Once | INTRAMUSCULAR | Status: AC
  Administered 2024-07-03: 20 mL via INTRA_ARTICULAR
  Filled 2024-07-03: qty 20

## 2024-07-03 NOTE — Progress Notes (Signed)
 PROVIDER NOTE: Interpretation of information contained herein should be left to medically-trained personnel. Specific patient instructions are provided elsewhere under Patient Instructions section of medical record. This document was created in part using STT-dictation technology, any transcriptional errors that may result from this process are unintentional.  Patient: Wanda Martinez Type: Established DOB: 1935-09-01 MRN: [id number] PCP: Glendia Shad, MD  Service: Procedure DOS: 07/03/2024 Setting: Ambulatory Location: Ambulatory outpatient facility Delivery: Face-to-face Provider: Eric DELENA Como, MD Specialty: Interventional Pain Management Specialty designation: 09 Location: Outpatient facility Ref. Prov.: Glendia Shad, MD       Interventional Therapy   Procedure: Sacroiliac Joint Steroid Injection R4L3     Laterality: Bilateral     Level: PSIS (Posterior Superior Iliac Spine)  Target: Interarticular sacroiliac joint. Location: Medial to the postero-medial edge of iliac spine. Region: Lumbosacral-sacrococcygeal. Approach: Superior postero-medial percutaneous approach. Type of procedure: Percutaneous joint injection.  Imaging: Fluoroscopy-guided Non-spinal ([id number]) Anesthesia: Local anesthesia (1-2% Lidocaine ) Anxiolysis: IV Versed  2.0 mg Sedation: No Sedation                       DOS: 07/03/2024  Performed by: Eric DELENA Como, MD  Purpose: Diagnostic/Therapeutic Indications: Sacroiliac joint pain in the lower back and hip area severe enough to impact quality of life or function. Rationale (medical necessity): procedure needed and proper for the diagnosis and/or treatment of Ms. Vanwyk's medical symptoms and needs. 1. Chronic low back pain (1ry area of Pain) (Bilateral) (R>L) w/o sciatica   2. Chronic sacroiliac joint pain (Bilateral)   3. 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 Disorder of sacroiliac joints (Bilateral)    5. Sacroiliac joint dysfunction (Bilateral)   6. Somatic dysfunction of sacroiliac joints (Bilateral)   7. Other specified dorsopathies, sacral and sacrococcygeal region   8. Low back pain of over 3 months duration   9. Intractable low back pain   10. Recurrent low back pain   11. Mechanical low back pain   12. Multifactorial low back pain    NAS-11 Pain score:   Pre-procedure: 7 /10   Post-procedure: 0-No pain/10      Position / Prep / Materials:  Position: Prone  Prep solution: ChloraPrep (2% chlorhexidine gluconate and 70% isopropyl alcohol) Prep Area: Entire posterior lumbosacral area  Materials:  Tray: Block Needle(s):  Type: Spinal  Gauge (G): 22  Length: 5-in Qty: One (1) per procedure side.  H&P (Pre-op Assessment):  Wanda Martinez is a 89 y.o. (year old), female patient, seen today for interventional treatment. She  has a past surgical history that includes Tonsillectomy and adenoidectomy (1979); Vesicovaginal fistula closure w/ TAH (1983); Breast surgery (1986); Breast enhancement surgery (1987); Breast implant removal; Breast implant removal (Right, 08/29/2012); Mastectomy (08/2012); Abdominal hysterectomy; Colonoscopy with propofol  (N/A, 09/13/2016); Breast biopsy (2013); and Blepharoplasty. Ms. Langone has a current medication list which includes the following prescription(s): acetaminophen , AMBULATORY NON FORMULARY MEDICATION, amlodipine , aspirin ec, calcium  carbonate, citalopram , citalopram , donepezil , fentanyl , fentanyl , gabapentin , glucosamine-chondroitin, hydrochlorothiazide , losartan , magnesium  oxide, melatonin, meloxicam , methocarbamol , metoprolol  succinate, mirabegron  er, multivitamin, naloxone , omega-3, oxycodone , oxycodone , pantoprazole , polyethylene glycol, prednisone , and rosuvastatin . Her primarily concern today is the Back Pain (lower)  Initial Vital Signs:  Pulse/HCG Rate: (!) 53ECG Heart Rate: (!) 55 Temp: (!) 97.3 F (36.3 C) Resp: 14 BP: 116/77 SpO2: 98  %  BMI: Estimated body mass index is 26.39 kg/m as calculated from the following:   Height as of this encounter: 5' 3 (1.6 m).  Weight as of this encounter: 149 lb (67.6 kg).  Risk Assessment: Allergies: Reviewed. She is allergic to sulfa antibiotics and vesicare [solifenacin].  Allergy Precautions: None required Coagulopathies: Reviewed. None identified.  Blood-thinner therapy: None at this time Active Infection(s): Reviewed. None identified. Ms. Strader is afebrile  Site Confirmation: Wanda Martinez was asked to confirm the procedure and laterality before marking the site Procedure checklist: Completed Consent: Before the procedure and under the influence of no sedative(s), amnesic(s), or anxiolytics, the patient was informed of the treatment options, risks and possible complications. To fulfill our ethical and legal obligations, as recommended by the American Medical Association's Code of Ethics, I have informed the patient of my clinical impression; the nature and purpose of the treatment or procedure; the risks, benefits, and possible complications of the intervention; the alternatives, including doing nothing; the risk(s) and benefit(s) of the alternative treatment(s) or procedure(s); and the risk(s) and benefit(s) of doing nothing. The patient was provided information about the general risks and possible complications associated with the procedure. These may include, but are not limited to: failure to achieve desired goals, infection, bleeding, organ or nerve damage, allergic reactions, paralysis, and death. In addition, the patient was informed of those risks and complications associated to the procedure, such as failure to decrease pain; infection; bleeding; organ or nerve damage with subsequent damage to sensory, motor, and/or autonomic systems, resulting in permanent pain, numbness, and/or weakness of one or several areas of the body; allergic reactions; (i.e.: anaphylactic reaction);  and/or death. Furthermore, the patient was informed of those risks and complications associated with the medications. These include, but are not limited to: allergic reactions (i.e.: anaphylactic or anaphylactoid reaction(s)); adrenal axis suppression; blood sugar elevation that in diabetics may result in ketoacidosis or comma; water retention that in patients with history of congestive heart failure may result in shortness of breath, pulmonary edema, and decompensation with resultant heart failure; weight gain; swelling or edema; medication-induced neural toxicity; particulate matter embolism and blood vessel occlusion with resultant organ, and/or nervous system infarction; and/or aseptic necrosis of one or more joints. Finally, the patient was informed that Medicine is not an exact science; therefore, there is also the possibility of unforeseen or unpredictable risks and/or possible complications that may result in a catastrophic outcome. The patient indicated having understood very clearly. We have given the patient no guarantees and we have made no promises. Enough time was given to the patient to ask questions, all of which were answered to the patient's satisfaction. Ms. Peavler has indicated that she wanted to continue with the procedure. Attestation: I, the ordering provider, attest that I have discussed with the patient the benefits, risks, side-effects, alternatives, likelihood of achieving goals, and potential problems during recovery for the procedure that I have provided informed consent. Date  Time: 07/03/2024 11:20 AM  Pre-Procedure Preparation:  Monitoring: As per clinic protocol. Respiration, ETCO2, SpO2, BP, heart rate and rhythm monitor placed and checked for adequate function Safety Precautions: Patient was assessed for positional comfort and pressure points before starting the procedure. Time-out: I initiated and conducted the Time-out before starting the procedure, as per protocol.  The patient was asked to participate by confirming the accuracy of the Time Out information. Verification of the correct person, site, and procedure were performed and confirmed by me, the nursing staff, and the patient. Time-out conducted as per Joint Commission's Universal Protocol (UP.01.01.01). Time: 1256 Start Time: 1256 hrs.  Description/Narrative of Procedure:  Start Time: 1256 hrs.  Rationale (medical necessity): procedure needed and proper for the diagnosis and/or treatment of the patient's medical symptoms and needs. Procedural Technique Safety Precautions: Aspiration looking for blood return was conducted prior to all injections. At no point did we inject any substances, as a needle was being advanced. No attempts were made at seeking any paresthesias. Safe injection practices and needle disposal techniques used. Medications properly checked for expiration dates. SDV (single dose vial) medications used. Description of the Procedure: Protocol guidelines were followed. The patient was assisted into a comfortable position. The target area was identified and the area prepped in the usual manner. Skin & deeper tissues infiltrated with local anesthetic. Appropriate amount of time allowed to pass for local anesthetics to take effect. The procedure needles were then advanced to the target area. Proper needle placement secured. Negative aspiration confirmed. Solution injected in intermittent fashion, asking for systemic symptoms every 0.5cc of injectate. The needles were then removed and the area cleansed, making sure to leave some of the prepping solution back to take advantage of its long term bactericidal properties.  Technical description of procedure:  Fluoroscopy using a posterior anterior 45 degree angle from the midline aiming at the anterolateral aspect of the patient was used to find a direct path into the sacroiliac joint, the superior medial to posterior superior iliac spine.   The skin was marked where the desired target and the skin infiltrated with local anesthetics.  The procedure needle was then advanced until the joint was entered.  Once inside of the joint, we then proceeded to inject the desired solution.  Vitals:   07/03/24 1117 07/03/24 1257 07/03/24 1300 07/03/24 1302  BP: 116/77 (!) 164/96 128/72 120/76  Pulse: (!) 53     Resp: 14 16 17 17   Temp: (!) 97.3 F (36.3 C)     TempSrc: Temporal     SpO2: 98% 95% 96% 97%  Weight: 149 lb (67.6 kg)     Height: 5' 3 (1.6 m)        End Time: 1301 hrs.  Imaging Guidance (Non-Spinal):          Type of Imaging Technique: Fluoroscopy Guidance (Non-Spinal) Indication(s): Fluoroscopy guidance for needle placement to enhance accuracy in procedures requiring precise needle localization for targeted delivery of medication in or near specific anatomical locations not easily accessible without such real-time imaging assistance. Exposure Time: Please see nurses notes. Contrast: None used. Fluoroscopic Guidance: I was personally present during the use of fluoroscopy. Tunnel Vision Technique used to obtain the best possible view of the target area. Parallax error corrected before commencing the procedure. Direction-depth-direction technique used to introduce the needle under continuous pulsed fluoroscopy. Once target was reached, antero-posterior, oblique, and lateral fluoroscopic projection used confirm needle placement in all planes. Images permanently stored in EMR. Interpretation: No contrast injected. I personally interpreted the imaging intraoperatively. Adequate needle placement confirmed in multiple planes. Permanent images saved into the patient's record.  Post-operative Assessment:  Post-procedure Vital Signs:  Pulse/HCG Rate: (!) 53(!) 53 Temp: (!) 97.3 F (36.3 C) Resp: 17 BP: 120/76 SpO2: 97 %  EBL: None  Complications: No immediate post-treatment complications observed by team, or reported by  patient.  Note: The patient tolerated the entire procedure well. A repeat set of vitals were taken after the procedure and the patient was kept under observation following institutional policy, for this type of procedure. Post-procedural neurological assessment was performed, showing return to baseline, prior to discharge. The patient was  provided with post-procedure discharge instructions, including a section on how to identify potential problems. Should any problems arise concerning this procedure, the patient was given instructions to immediately contact us , at any time, without hesitation. In any case, we plan to contact the patient by telephone for a follow-up status report regarding this interventional procedure.  Comments:  No additional relevant information.  Plan of Care (POC)  Orders:  Orders Placed This Encounter  Procedures   SACROILIAC JOINT INJECTION    Scheduling Instructions:     Side: Bilateral     Sedation: Patient's choice.     Date: 07/03/2024    Where will this procedure be performed?:   [facility]   DG [facility] C-ARM 1-60 MIN NO REPORT    Intraoperative interpretation by procedural physician at [facility].    Standing Status:   Standing    Number of Occurrences:   1    Reason for exam::   Assistance in needle guidance and placement for procedures requiring needle placement in or near specific anatomical locations not easily accessible without such assistance.   Informed Consent Details: Physician/Practitioner Attestation; Transcribe to consent form and obtain patient signature    Nursing Order: Transcribe to consent form and obtain patient signature. Note: Always confirm laterality of pain with Ms. Renie, before procedure.    Physician/Practitioner attestation of informed consent for procedure/surgical case:   I, the physician/practitioner, attest that I have discussed with the patient the benefits, risks, side effects, alternatives, likelihood  of achieving goals and potential problems during recovery for the procedure that I have provided informed consent.    Procedure:   Sacroiliac Joint Block    Physician/Practitioner performing the procedure:   Khaliah Barnick A. Tanya, MD    Indication/Reason:   Chronic Low Back and Hip Pain secondary to Sacroiliac Joint Pain (Arthralgia/Arthropathy)   Provide equipment / supplies at bedside    Procedure tray: Block Tray (Disposable  single use) Skin infiltration needle: Regular 1.5-in, 25-G, (x1) Block Needle type: Spinal Amount/quantity: 2 Size: Long (7-inch) Gauge: 22G    Standing Status:   Standing    Number of Occurrences:   1    Specify:   Block Tray   Saline lock IV    Have LR [phone] mL available and administer at 125 mL/hr if patient becomes hypotensive.    Standing Status:   Standing    Number of Occurrences:   1     Opioid Analgesic: Suspended (06/17/2024) for patient's safety reasons. No more chronic opioid analgesics therapy prescribed by our practice. Obtaining pain medications from other practitioners w/o approval or consultation.  Patient and husband notified on 06/17/2024. MME/day: Unknown mg/day.      Medications ordered for procedure: Meds ordered this encounter  Medications   lidocaine  (XYLOCAINE ) 2 % (with pres) injection 400 mg   pentafluoroprop-tetrafluoroeth (GEBAUERS) aerosol   midazolam  (VERSED ) injection 0.5-2 mg    Make sure Flumazenil is available in the pyxis when using this medication. If oversedation occurs, administer 0.2 mg IV over 15 sec. If after 45 sec no response, administer 0.2 mg again over 1 min; may repeat at 1 min intervals; not to exceed 4 doses (1 mg)   ropivacaine  (PF) 2 mg/mL (0.2%) (NAROPIN ) injection 9 mL   methylPREDNISolone  acetate (DEPO-MEDROL ) injection 80 mg   Medications administered: We administered lidocaine , pentafluoroprop-tetrafluoroeth, midazolam , ropivacaine  (PF) 2 mg/mL (0.2%), and methylPREDNISolone  acetate.  See the  medical record for exact dosing, route, and time of administration.  Interventional Therapies  Risk Factors  Considerations  Medical Comorbidities:  Parkinson's disease  Hx right breast cancer  OIC  SOB  CKD Stage 3b  GERD  depression  osteopenia/osteoporosis  PSVT  Memory impairment      Planned  Pending:   Diagnostic/therapeutic bilateral SI joint Blk R4L3    Under consideration:  Therapeutic right suprascapular nerve RFA #1  Possible intrathecal pump trial and permanent implant    Completed:   Therapeutic right genicular nerve RFA x1 (06/19/2024)  Diagnostic right genicular nerve blocks x1 (06/10/2024) (100/100/50/50)  Diagnostic left suprascapular NB x1 (04/17/2024) (10-0/10)  Diagnostic left infraspinatus TPI/MNB x1 (04/01/2024) (0/0/0/0)  Therapeutic bilateral lumbar facet Blk x15 (03/20/2024)  Therapeutic bilateral lumbar facet RFA x R4L2  Diagnostic/therapeutic right suprascapular NB x2  Therapeutic bilateral trapezius/thoracic TPI x R6L1  Therapeutic right lumbar TPI x1  Therapeutic bilateral SI inj. x R3L2  Therapeutic right SI RFA x4  Therapeutic right knee inj. (steroid x5) (Zilretta  x3) (Monovisc x1) (pes anserine x2) (12/06/2023)  Therapeutic TFESI (bilateral: L5, L2 x1) (right: L5 x4, L4 x1, L3 x1)  Therapeutic LESI (midline: L2-3 x1) (right: L3-4 x2, L4-5 x2, L5-S1 x2)    Therapeutic  Palliative (PRN) options:   Do not schedule any procedures without a proper preprocedure evaluation.   Completed by other providers:   Therapeutic right knee (ORTHOVISC) inj. x3 (05/18/2023, 05/23/2023, 05/30/2023) by Arthea Peggye Sheer, MD [facility]) Therapeutic left knee (Kenalog ) inj. x1 (08/25/2020) by Earnestine Leverette Blanch, MD ([facility])   Pharmacotherapy  Nonopioids transfer 12/21/2020: Gabapentin        Follow-up plan:   Return in about 2 weeks (around 07/17/2024) for (Face2F), (PPE).     Recent Visits Date Type Provider Dept  06/30/24 Office Visit Tanya Glisson, MD [facility]  06/19/24 Procedure visit Tanya Glisson, MD [facility]  06/17/24 Office Visit Tanya Glisson, MD [facility]  06/10/24 Procedure visit Tanya Glisson, MD [facility]  05/28/24 Office Visit Tanya Glisson, MD [facility]  05/07/24 Office Visit Tanya Glisson, MD [facility]  04/23/24 Office Visit Tanya Glisson, MD [facility]  04/17/24 Procedure visit Tanya Glisson, MD [facility]  Showing recent visits within past 90 days and meeting all other requirements Today's Visits Date Type Provider Dept  07/03/24 Procedure visit Tanya Glisson, MD [facility]  Showing today's visits and meeting all other requirements Future Appointments Date Type Provider Dept  07/23/24 Appointment Tanya Glisson, MD [facility]  Showing future appointments within next 90 days and meeting all other requirements   Disposition: Discharge home  Discharge (Date  Time): 07/03/2024;   hrs.   Primary Care Physician: Glendia Shad, MD Location: [facility] Note by: Glisson DELENA Tanya, MD (TTS technology used. I apologize for any typographical errors that were not detected and corrected.) Date: 07/03/2024; Time: 2:35 PM  Disclaimer:  Medicine is not an Visual merchandiser. The only guarantee in medicine is that nothing is guaranteed. It is important to note that the decision to proceed with this intervention was based on the information collected from the patient. The Data and conclusions were drawn from the patient's questionnaire, the interview, and the physical examination. Because the information was provided in large part by the patient, it cannot be guaranteed that it has not been purposely or unconsciously manipulated. Every effort has been made to obtain as much relevant data as possible for this evaluation. It is important to  note that the conclusions that lead to this procedure are derived in large part from the available data. Always take into account that the treatment will also be dependent on availability of resources and existing treatment guidelines, considered by other Pain Management Practitioners as being common knowledge and practice, at the time of the intervention. For Medico-Legal purposes, it is also important to point out that variation in procedural techniques and pharmacological choices are the acceptable norm. The indications, contraindications, technique, and results of the above procedure should only be interpreted and judged by a Board-Certified Interventional Pain Specialist with extensive familiarity and expertise in the same exact procedure and technique.

## 2024-07-03 NOTE — Patient Instructions (Addendum)

## 2024-07-04 ENCOUNTER — Telehealth: Payer: Self-pay | Admitting: *Deleted

## 2024-07-04 ENCOUNTER — Other Ambulatory Visit: Payer: Self-pay | Admitting: Internal Medicine

## 2024-07-04 NOTE — Telephone Encounter (Signed)
 Post procedure call; spoke with Wanda Martinez states her back is okay but she does have pain in her knee which is an ongoing issue for patient.  Otherwise doing well.

## 2024-07-08 ENCOUNTER — Ambulatory Visit: Admitting: Pain Medicine

## 2024-07-15 ENCOUNTER — Ambulatory Visit: Admitting: Physician Assistant

## 2024-07-17 ENCOUNTER — Encounter: Payer: Self-pay | Admitting: Oncology

## 2024-07-22 NOTE — Progress Notes (Unsigned)
 PROVIDER NOTE: Interpretation of information contained herein should be left to medically-trained personnel. Specific patient instructions are provided elsewhere under Patient Instructions section of medical record. This document was created in part using AI and STT-dictation technology, any transcriptional errors that may result from this process are unintentional.  Patient: Wanda Martinez  Service: E/M   PCP: Glendia Shad, MD  DOB: 1935-07-05  DOS: 07/23/2024  Provider: Eric DELENA Como, MD  MRN: [id number]  Delivery: Face-to-face  Specialty: Interventional Pain Management  Type: Established Patient  Setting: Ambulatory outpatient facility  Specialty designation: 09  Referring Prov.: Glendia Shad, MD  Location: Outpatient office facility       History of present illness (HPI) Wanda Martinez, a 89 y.o. year old female, is here today because of her Chronic bilateral low back pain without sciatica [M54.50, G89.29]. Wanda Martinez primary complain today is No chief complaint on file.  Pertinent problems: Wanda Martinez has Headache; Chronic low back pain (1ry area of Pain) (Bilateral) (R>L) w/o sciatica; Lumbar spondylosis; Chronic hip pain (Bilateral); Chronic neck pain; Cervical spondylosis; Chronic cervical radicular pain (Right); Diffuse myofascial pain syndrome; Neurogenic pain; Chronic upper back pain (Right); Myofascial pain syndrome (Right) (cervicothoracic); Lumbar facet syndrome (Bilateral) (R>L); History of breast cancer; Cervical facet hypertrophy; Cervical facet syndrome (HCC); Chronic shoulder pain (Right); Chronic pain syndrome; Chronic sacroiliac joint pain (Left); Chronic sacroiliac joint pain (Right); 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, L4-5, L5-S1) (Right); Lumbar spinal stenosis (with neurogenic claudication) (L3-4); Chronic lower extremity pain (Right); Chronic lumbar radicular pain (Right); Trochanteric bursitis of hip (Bilateral); Spondylosis without myelopathy or  radiculopathy, lumbosacral region; Trigger point with back pain (Right); DDD (degenerative disc disease), lumbosacral; Chronic upper extremity pain (Right); Chronic thoracic back pain (Bilateral) (L>R); Trigger point of thoracic region (Bilateral) (L>R); Other specified dorsopathies, sacral and sacrococcygeal region; Sacroiliac joint dysfunction (Right); Bilateral sacroiliitis (HCC); Somatic dysfunction of sacroiliac joint (Right); Chronic musculoskeletal pain; Facial pain; Chronic hip pain (Right); Osteoarthritis of hip (Right); Left ear pain; 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HCC); Abnormal MRI, lumbar spine (03/14/2024); Osteoarthritis involving multiple joints; Other spondylosis, sacral and sacrococcygeal region; Lumbar facet hypertrophy (Multilevel) (Bilateral); Chronic knee pain (Right); Osteoarthritis of knee (Right); Trigger point of shoulder region (Right); Osteoarthritis of AC (acromioclavicular) joint (Right); Osteoarthritis of shoulder (Right); Unspecified injury of muscle(s) and tendon(s) of the rotator cuff of shoulder, sequela (Right); Cervicalgia; DDD (degenerative disc disease), lumbar; Lumbar radicular pain (Bilateral); Lumbar foraminal stenosis (Right: L3-4, L4-5, L5-S1) (Left: L2-3); Abnormal MRI, hip (Right) (05/04/2022); Abnormal MRI, knee (Right) (11/29/2023); Tricompartment osteoarthritis of knee (Right); Chronic low back pain (Right) w/o sciatica; Hoffa's fat pad disease (HCC) (Right); Hoffa's pad syndrome (HCC) (Right); Chronic low back pain (Right) w/ sciatica (Right); Lower extremity weakness (Right); Chronic radicular pain of lower back; Chronic sciatica (Right); Lumbosacral radiculopathy at L5 (Right); Low back pain of over 3 months duration; Personal history of breast cancer; Infrapatellar bursitis of knee (Right); Traumatic ecchymosis of right lower leg; Lumbar facet joint pain; Medial knee pain (Right); Chronic patellofemoral pain of knee (Right); Patellofemoral joint pain (Right); Knee  hemarthrosis (Right); Pes anserinus bursitis of knee (Right); Abnormal MRI, shoulder (Right) (06/21/2023); Wound, open, leg; Chronic hip pain (Left); Osteoarthritis of hips (Bilateral) (R>L); Intractable back pain, acute on chronic; Intermittent low back pain; Trochanteric bursitis of left hip; Sprain of left shoulder, initial encounter; Chronic shoulder pain (Left); Osteoarthritis of AC (acromioclavicular) joint (Left); Arthralgia of acromioclavicular joint (Left); Osteoarthritis of shoulder (Left); Trigger point of shoulder region (Left); Mechanical low  back pain; Multifactorial low back pain; Chronic shoulder pain (Bilateral); Arthralgia of multiple sites; Generalized joint pain; Generalized osteoarthritis of multiple sites; Chronic generalized pain disorder; Sacroiliac joint dysfunction (Bilateral); Chronic sacroiliac joint pain (Bilateral); Degenerative joint disease of sacroiliac region (HCC) (Bilateral); Disorder of sacroiliac joints (Bilateral); Somatic dysfunction of sacroiliac joints (Bilateral); Intractable low back pain; and Recurrent low back pain on their pertinent problem list.  Pain Assessment: Severity of   is reported as a  /10. Location:    / . Onset:  . Quality:  . Timing:  . Modifying factor(s):  SABRA Vitals:  vitals were not taken for this visit.  BMI: Estimated body mass index is 26.39 kg/m as calculated from the following:   Height as of 07/03/24: 5' 3 (1.6 m).   Weight as of 07/03/24: 149 lb (67.6 kg).  Last encounter: 06/30/2024. Last procedure: 07/03/2024.  Reason for encounter: post-procedure evaluation and assessment.   Discussed the use of AI scribe software for clinical note transcription with the patient, who gave verbal consent to proceed.  History of Present Illness          Post-Procedure Evaluation   Procedure: Sacroiliac Joint Steroid Injection R4L3     Laterality: Bilateral     Level: PSIS (Posterior Superior Iliac Spine)  Target: Interarticular sacroiliac  joint. Location: Medial to the postero-medial edge of iliac spine. Region: Lumbosacral-sacrococcygeal. Approach: Superior postero-medial percutaneous approach. Type of procedure: Percutaneous joint injection.  Imaging: Fluoroscopy-guided Non-spinal ([id number]) Anesthesia: Local anesthesia (1-2% Lidocaine ) Anxiolysis: IV Versed  2.0 mg Sedation: No Sedation                       DOS: 07/03/2024  Performed by: Eric DELENA Como, MD  Purpose: Diagnostic/Therapeutic Indications: Sacroiliac joint pain in the lower back and hip area severe enough to impact quality of life or function. Rationale (medical necessity): procedure needed and proper for the diagnosis and/or treatment of Ms. Bugge's medical symptoms and needs. 1. Chronic low back pain (1ry area of Pain) (Bilateral) (R>L) w/o sciatica   2. Chronic sacroiliac joint pain (Bilateral)   3. 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 Disorder of sacroiliac joints (Bilateral)   5. Sacroiliac joint dysfunction (Bilateral)   6. Somatic dysfunction of sacroiliac joints (Bilateral)   7. Other specified dorsopathies, sacral and sacrococcygeal region   8. Low back pain of over 3 months duration   9. Intractable low back pain   10. Recurrent low back pain   11. Mechanical low back pain   12. Multifactorial low back pain    NAS-11 Pain score:   Pre-procedure: 7 /10   Post-procedure: 0-No pain/10     Effectiveness:  Initial hour after procedure:   ***. Subsequent 4-6 hours post-procedure:   ***. Analgesia past initial 6 hours:   ***. Ongoing improvement:  Analgesic:  *** Function:    ***    ROM:    ***     Pharmacotherapy Assessment   Opioid Analgesic: Suspended (06/17/2024) for patient's safety reasons. No more chronic opioid analgesics therapy prescribed by our practice. Obtaining pain medications from other practitioners w/o approval or consultation.  Patient and husband notified on 06/17/2024. MME/day:  Unknown mg/day.     Monitoring: [place] PMP: PDMP reviewed during this encounter.       Pharmacotherapy: No side-effects or adverse reactions reported. Compliance: No problems identified. Effectiveness: Clinically acceptable.  No notes on file  UDS:  Summary  Date Value Ref Range  Status  04/23/2024 FINAL  Final    Comment:    ==================================================================== Compliance Drug Analysis, Ur ==================================================================== Test                             Result       Flag       Units  Drug Present and Declared for Prescription Verification   Oxycodone                       4533         EXPECTED   ng/mg creat   Noroxycodone                   4070         EXPECTED   ng/mg creat    Sources of oxycodone  include scheduled prescription medications.    Noroxycodone is an expected metabolite of oxycodone .  Drug Present not Declared for Prescription Verification   Gabapentin                      PRESENT      UNEXPECTED   Baclofen                        PRESENT      UNEXPECTED   Citalopram                      PRESENT      UNEXPECTED   Desmethylcitalopram            PRESENT      UNEXPECTED    Desmethylcitalopram is an expected metabolite of citalopram  or the    enantiomeric form, escitalopram.    Acetaminophen                   PRESENT      UNEXPECTED   Lidocaine                       PRESENT      UNEXPECTED   Metoprolol                      PRESENT      UNEXPECTED ==================================================================== Test                      Result    Flag   Units      Ref Range   Creatinine              106              mg/dL      >=79 ==================================================================== Declared Medications:  The flagging and interpretation on this report are based on the  following declared medications.  Unexpected results may arise from  inaccuracies in the declared medications.   **Note:  The testing scope of this panel includes these medications:   Oxycodone    **Note: The testing scope of this panel does not include the  following reported medications:   Omega-3 Fatty Acids  Pantoprazole  (Protonix )  Polyethylene Glycol (MiraLAX )  Prednisone  (Deltasone )  Rosuvastatin  (Crestor ) ==================================================================== For clinical consultation, please call [phone]. ====================================================================     No results found for: CBDTHCR No results found for: D8THCCBX No results found for: D9THCCBX  ROS  Constitutional: Denies any fever or chills Gastrointestinal: No reported hemesis, hematochezia, vomiting, or acute GI distress Musculoskeletal: Denies any acute  onset joint swelling, redness, loss of ROM, or weakness Neurological: No reported episodes of acute onset apraxia, aphasia, dysarthria, agnosia, amnesia, paralysis, loss of coordination, or loss of consciousness  Medication Review  AMBULATORY NON FORMULARY MEDICATION, Glucosamine-Chondroitin, Omega-3, acetaminophen , amLODipine , aspirin EC, calcium  carbonate, citalopram , donepezil , fentaNYL , gabapentin , hydrochlorothiazide , losartan , magnesium  oxide, melatonin, meloxicam , methocarbamol , metoprolol  succinate, mirabegron  ER, multivitamin, naloxone , oxyCODONE , pantoprazole , polyethylene glycol, predniSONE , and rosuvastatin   History Review  Allergy: Ms. Gilkey is allergic to sulfa antibiotics and vesicare [solifenacin]. Drug: Ms. Manahan  reports current drug use. Alcohol:  reports no history of alcohol use. Tobacco:  reports that she has never smoked. She has never been exposed to tobacco smoke. She has never used smokeless tobacco. Social: Ms. He  reports that she has never smoked. She has never been exposed to tobacco smoke. She has never used smokeless tobacco. She reports current drug use. She reports that she does not drink  alcohol. Medical:  has a past medical history of Acute postoperative pain (10/25/2017), Anemia, Arm pain (07/26/2015), Arthritis, Arthritis, degenerative (03/26/2014), Back pain (11/01/2013), Bladder infection (06/2018), Breast cancer (HCC), Breast cancer (HCC), CHEST PAIN (04/29/2010), Chronic cystitis, Cystocele (02/22/2013), Cystocele, midline (08/19/2013), Degeneration of intervertebral disc of lumbosacral region (03/26/2014), DYSPNEA (04/29/2010), Enthesopathy of hip (03/26/2014), GERD (gastroesophageal reflux disease), Hiatal hernia, HTN (hypertension), Hypokalemia (06/2018), Hyponatremia (06/2018), LBP (low back pain) (03/26/2014), Neck pain (11/01/2013), Parkinson disease (HCC), Pneumonia (06/2018), Sinusitis (02/07/2015), Skin lesions (07/12/2014), and Urinary incontinence. Surgical: Ms. Pinch  has a past surgical history that includes Tonsillectomy and adenoidectomy (1979); Vesicovaginal fistula closure w/ TAH (1983); Breast surgery (1986); Breast enhancement surgery (1987); Breast implant removal; Breast implant removal (Right, 08/29/2012); Mastectomy (08/2012); Abdominal hysterectomy; Colonoscopy with propofol  (N/A, 09/13/2016); Breast biopsy (2013); and Blepharoplasty. Family: family history includes Colon polyps in her father; Diabetes in her father; Parkinson's disease in her mother; Stroke in her father and mother.  Laboratory Chemistry Profile   Renal Lab Results  Component Value Date   BUN 43 (H) 04/24/2024   CREATININE 0.96 04/24/2024   BCR 38 (H) 04/23/2024   GFR 46.06 (L) 02/18/2024   GFRAA >60 07/27/2020   GFRNONAA 58 (L) 04/24/2024    Hepatic Lab Results  Component Value Date   AST 22 04/24/2024   ALT 20 04/24/2024   ALBUMIN 3.1 (L) 04/24/2024   ALKPHOS 26 (L) 04/24/2024    Electrolytes Lab Results  Component Value Date   NA 132 (L) 04/24/2024   K 3.7 04/24/2024   CL 98 04/24/2024   CALCIUM  8.8 (L) 04/24/2024   MG 2.4 (H) 04/23/2024    Bone Lab Results  Component Value Date    VD25OH 66.95 09/26/2022   25OHVITD1 61 04/23/2024   25OHVITD2 <1.0 04/23/2024   25OHVITD3 60 04/23/2024    Inflammation (CRP: Acute Phase) (ESR: Chronic Phase) Lab Results  Component Value Date   CRP 27 (H) 04/23/2024   ESRSEDRATE 15 04/23/2024         Note: Above Lab results reviewed.  Recent Imaging Review  DG PAIN CLINIC C-ARM 1-60 MIN NO REPORT Fluoro was used, but no Radiologist interpretation will be provided.  Please refer to NOTES tab for provider progress note. Note: Reviewed        Physical Exam  Vitals: LMP 12/18/1981  BMI: Estimated body mass index is 26.39 kg/m as calculated from the following:   Height as of 07/03/24: 5' 3 (1.6 m).   Weight as of 07/03/24: 149 lb (67.6 kg). Ideal: Patient weight not recorded General appearance:  Well nourished, well developed, and well hydrated. In no apparent acute distress Mental status: Alert, oriented x 3 (person, place, & time)       Respiratory: No evidence of acute respiratory distress Eyes: PERLA   Assessment   Diagnosis Status  1. Chronic low back pain (1ry area of Pain) (Bilateral) (R>L) w/o sciatica   2. Chronic sacroiliac joint pain (Bilateral)   3. Low back pain of over 3 months duration   4. Intractable low back pain   5. Recurrent low back pain   6. Postop check    Controlled Controlled Controlled   Updated Problems: No problems updated.  Plan of Care  Problem-specific:  Assessment and Plan            Ms. Ema Hebner has a current medication list which includes the following long-term medication(s): amlodipine , calcium  carbonate, citalopram , citalopram , donepezil , fentanyl , fentanyl , gabapentin , hydrochlorothiazide , losartan , magnesium  oxide, metoprolol  succinate, mirabegron  er, oxycodone , oxycodone , pantoprazole , and rosuvastatin .  Pharmacotherapy (Medications Ordered): No orders of the defined types were placed in this encounter.  Orders:  No orders of the defined types were placed  in this encounter.    Interventional Therapies  Risk Factors  Considerations  Medical Comorbidities:  Parkinson's disease  Hx right breast cancer  OIC  SOB  CKD Stage 3b  GERD  depression  osteopenia/osteoporosis  PSVT  Memory impairment      Planned  Pending:   Diagnostic/therapeutic bilateral SI joint Blk R4L3    Under consideration:  Therapeutic right suprascapular nerve RFA #1  Possible intrathecal pump trial and permanent implant    Completed:   Therapeutic right genicular nerve RFA x1 (06/19/2024)  Diagnostic right genicular nerve blocks x1 (06/10/2024) (100/100/50/50)  Diagnostic left suprascapular NB x1 (04/17/2024) (10-0/10)  Diagnostic left infraspinatus TPI/MNB x1 (04/01/2024) (0/0/0/0)  Therapeutic bilateral lumbar facet Blk x15 (03/20/2024)  Therapeutic bilateral lumbar facet RFA x R4L2  Diagnostic/therapeutic right suprascapular NB x2  Therapeutic bilateral trapezius/thoracic TPI x R6L1  Therapeutic right lumbar TPI x1  Therapeutic bilateral SI inj. x R3L2  Therapeutic right SI RFA x4  Therapeutic right knee inj. (steroid x5) (Zilretta  x3) (Monovisc x1) (pes anserine x2) (12/06/2023)  Therapeutic TFESI (bilateral: L5, L2 x1) (right: L5 x4, L4 x1, L3 x1)  Therapeutic LESI (midline: L2-3 x1) (right: L3-4 x2, L4-5 x2, L5-S1 x2)    Therapeutic  Palliative (PRN) options:   Do not schedule any procedures without a proper preprocedure evaluation.   Completed by other providers:   Therapeutic right knee (ORTHOVISC) inj. x3 (05/18/2023, 05/23/2023, 05/30/2023) by Arthea Peggye Sheer, MD [facility]) Therapeutic left knee (Kenalog ) inj. x1 (08/25/2020) by Earnestine Leverette Blanch, MD ([facility])   Pharmacotherapy  Nonopioids transfer 12/21/2020: Gabapentin       No follow-ups on file.    Recent Visits Date Type Provider Dept  07/03/24 Procedure visit Tanya Glisson, MD [facility]  06/30/24 Office Visit Tanya Glisson, MD [facility]   06/19/24 Procedure visit Tanya Glisson, MD [facility]  06/17/24 Office Visit Tanya Glisson, MD [facility]  06/10/24 Procedure visit Tanya Glisson, MD [facility]  05/28/24 Office Visit Tanya Glisson, MD [facility]  05/07/24 Office Visit Tanya Glisson, MD [facility]  04/23/24 Office Visit Tanya Glisson, MD [facility]  Showing recent visits within past 90 days and meeting all other requirements Future Appointments Date Type Provider Dept  07/23/24 Appointment Tanya Glisson, MD [facility]  Showing future appointments within next  90 days and meeting all other requirements  I discussed the assessment and treatment plan with the patient. The patient was provided an opportunity to ask questions and all were answered. The patient agreed with the plan and demonstrated an understanding of the instructions.  Patient advised to call back or seek an in-person evaluation if the symptoms or condition worsens.  Duration of encounter: *** minutes.  Total time on encounter, as per AMA guidelines included both the face-to-face and non-face-to-face time personally spent by the physician and/or other qualified health care professional(s) on the day of the encounter (includes time in activities that require the physician or other qualified health care professional and does not include time in activities normally performed by clinical staff). Physician's time may include the following activities when performed: Preparing to see the patient (e.g., pre-charting review of records, searching for previously ordered imaging, lab work, and nerve conduction tests) Review of prior analgesic pharmacotherapies. Reviewing PMP Interpreting ordered tests (e.g., lab work, imaging, nerve conduction tests) Performing post-procedure evaluations, including interpretation of diagnostic procedures Obtaining and/or reviewing  separately obtained history Performing a medically appropriate examination and/or evaluation Counseling and educating the patient/family/caregiver Ordering medications, tests, or procedures Referring and communicating with other health care professionals (when not separately reported) Documenting clinical information in the electronic or other health record Independently interpreting results (not separately reported) and communicating results to the patient/ family/caregiver Care coordination (not separately reported)  Note by: Eric DELENA Como, MD (TTS and AI technology used. I apologize for any typographical errors that were not detected and corrected.) Date: 07/23/2024; Time: 7:52 AM

## 2024-07-23 ENCOUNTER — Ambulatory Visit: Admitting: Physician Assistant

## 2024-07-23 ENCOUNTER — Ambulatory Visit (HOSPITAL_BASED_OUTPATIENT_CLINIC_OR_DEPARTMENT_OTHER): Admitting: Pain Medicine

## 2024-07-23 DIAGNOSIS — Z91199 Patient's noncompliance with other medical treatment and regimen due to unspecified reason: Secondary | ICD-10-CM

## 2024-07-23 DIAGNOSIS — Z09 Encounter for follow-up examination after completed treatment for conditions other than malignant neoplasm: Secondary | ICD-10-CM

## 2024-07-23 DIAGNOSIS — G8929 Other chronic pain: Secondary | ICD-10-CM

## 2024-07-23 DIAGNOSIS — M5459 Other low back pain: Secondary | ICD-10-CM

## 2024-07-23 DIAGNOSIS — M545 Low back pain, unspecified: Secondary | ICD-10-CM

## 2024-07-24 ENCOUNTER — Telehealth: Payer: Self-pay

## 2024-07-24 NOTE — Telephone Encounter (Signed)
 She called back regarding the phone call about her last procedure. She said it helped probably 75%.

## 2024-07-29 ENCOUNTER — Ambulatory Visit: Payer: Medicare Other

## 2024-07-29 ENCOUNTER — Inpatient Hospital Stay: Payer: Medicare Other | Admitting: Oncology

## 2024-07-29 ENCOUNTER — Inpatient Hospital Stay: Payer: Medicare Other

## 2024-08-04 ENCOUNTER — Encounter: Payer: Self-pay | Admitting: Oncology

## 2024-08-06 ENCOUNTER — Other Ambulatory Visit: Payer: Self-pay | Admitting: Internal Medicine

## 2024-08-08 ENCOUNTER — Ambulatory Visit: Admitting: Physician Assistant

## 2024-08-21 ENCOUNTER — Encounter: Payer: Self-pay | Admitting: Oncology

## 2024-09-04 ENCOUNTER — Other Ambulatory Visit

## 2024-09-04 ENCOUNTER — Other Ambulatory Visit: Payer: Self-pay | Admitting: Internal Medicine

## 2024-09-23 NOTE — Progress Notes (Signed)
 Assessment & Plan:  1. SOB (shortness of breath) (Primary)   Patient Instructions  We will see you as needed.  To give us  a call back should you develop any further breathing issues.   Continue follow-ups with Dr. Glendia.  Please note: late entry documentation due to logistical difficulties during COVID-19 pandemic. This note is filed for information purposes only, and is not intended to be used for billing, nor does it represent the full scope/nature of the visit in question. Please see any associated scanned media linked to date of encounter for additional pertinent information.  Subjective:    HPI: Wanda Martinez is a 89 y.o. female presenting to the pulmonology clinic on 02/26/2020 with report of: Follow-up (pt reports of mild sob with exertion. )     Outpatient Encounter Medications as of 02/26/2020  Medication Sig Note   aspirin 81 MG EC tablet Take 81 mg by mouth daily as needed.    calcium  carbonate (OSCAL) 1500 (600 Ca) MG TABS tablet Take 1 tablet by mouth daily.    Multiple Vitamin (MULTIVITAMIN) tablet Take 1 tablet by mouth daily.    [DISCONTINUED] amLODipine  (NORVASC ) 2.5 MG tablet TAKE 1 TABLET BY MOUTH ONCE DAILY.    [DISCONTINUED] citalopram  (CELEXA ) 20 MG tablet TAKE 1 & 1/2 TABLETS BY MOUTH ONCE DAILY    [DISCONTINUED] cyclobenzaprine  (FLEXERIL ) 10 MG tablet TAKE ONE TABLET BY MOUTH AT BEDTIME.    [DISCONTINUED] gabapentin  (NEURONTIN ) 100 MG capsule Take 200 mg by mouth 3 (three) times daily.     [DISCONTINUED] GLUCOSAMINE SULFATE PO Take by mouth daily. (Patient not taking: Reported on 05/02/2023)    [DISCONTINUED] hydrochlorothiazide  (HYDRODIURIL ) 25 MG tablet TAKE 1 TABLET BY MOUTH ONCE DAILY. (PATIENT TAKES 1/2 ONCE DAILY AND EXTRA 1/2 IF NEEDED)    [DISCONTINUED] HYDROcodone -acetaminophen  (NORCO/VICODIN) 5-325 MG tablet Take 1 tablet by mouth every 8 (eight) hours as needed for severe pain. Must last 30 days 12/22/2019: Future Prescription, NOT  DUPLICATES. >>>DO NOT DELETE, even if Expired!!!<<< See Care Coordination Note from [facility] (Dr. Tanya)   [DISCONTINUED] HYDROcodone -acetaminophen  (NORCO/VICODIN) 5-325 MG tablet Take 1 tablet by mouth every 8 (eight) hours as needed for severe pain. Must last 30 days 12/22/2019: Future Prescription, NOT DUPLICATES. >>>DO NOT DELETE, even if Expired!!!<<< See Care Coordination Note from [facility] (Dr. Tanya)     [DISCONTINUED] letrozole  (FEMARA ) 2.5 MG tablet Take 2.5 mg by mouth daily.    [DISCONTINUED] losartan  (COZAAR ) 50 MG tablet TAKE (2) TABLETS BY MOUTH ONCE DAILY.    [DISCONTINUED] lubiprostone  (AMITIZA ) 24 MCG capsule TAKE 1 CAPSULE BY MOUTH ONCE DAILY WITH BREAKFAST.    [DISCONTINUED] magnesium  oxide (MAG-OX) 400 (241.3 Mg) MG tablet TAKE 1 TABLET BY MOUTH ONCE DAILY.    [DISCONTINUED] meloxicam  (MOBIC ) 7.5 MG tablet TAKE 1 TABLET BY MOUTH ONCE DAILY.    [DISCONTINUED] metoprolol  succinate (TOPROL -XL) 25 MG 24 hr tablet TAKE 1 TABLET BY MOUTH TWICE DAILY    [DISCONTINUED] nystatin  (MYCOSTATIN ) 100000 UNIT/ML suspension Swish and spit tid (Patient not taking: Reported on 03/15/2021)    [DISCONTINUED] Omega-3 1000 MG CAPS Take by mouth daily.  (Patient not taking: No sig reported)    [DISCONTINUED] omeprazole  (PRILOSEC) 20 MG capsule TAKE 1 CAPSULE BY MOUTH TWICE DAILY FOR REFLUX.    [DISCONTINUED] pantoprazole  (PROTONIX ) 40 MG tablet TAKE 1 TABLET BY MOUTH ONCE DAILY.    [DISCONTINUED] pravastatin  (PRAVACHOL ) 20 MG tablet TAKE 1 TABLET BY MOUTH ONCE DAILY.    [DISCONTINUED] HYDROcodone -acetaminophen  (NORCO/VICODIN)  5-325 MG tablet Take 1 tablet by mouth every 8 (eight) hours as needed for severe pain. Must last 30 days 12/22/2019: Future Prescription, NOT DUPLICATES. >>>DO NOT DELETE, even if Expired!!!<<< See Care Coordination Note from [facility] (Dr. Tanya)   No facility-administered encounter medications on file as of 02/26/2020.      Objective:    Vitals:   02/26/20 0929 02/26/20 0930  BP: 122/70 122/70  Pulse: 73 73  Temp:  97.8 F (36.6 C)  Height: 5' 4 (1.626 m) 5' 4 (1.626 m)  Weight: 188 lb 6.4 oz (85.5 kg) 188 lb (85.3 kg)  SpO2: 97% 97%  TempSrc:  Temporal  BMI (Calculated): 32.32 32.25     Physical exam documentation is limited by delayed entry of information.

## 2024-10-07 ENCOUNTER — Encounter: Payer: Self-pay | Admitting: Oncology

## 2024-10-08 ENCOUNTER — Other Ambulatory Visit: Payer: Self-pay | Admitting: Internal Medicine

## 2024-10-08 DIAGNOSIS — E7800 Pure hypercholesterolemia, unspecified: Secondary | ICD-10-CM

## 2024-10-08 NOTE — Progress Notes (Deleted)
 Cardiology Clinic Note   Date: 10/08/2024 ID: [name], [name] [birth year], MRN [id number]  Primary Cardiologist:  Redell Cave, MD  Chief Complaint   Wanda Martinez is a 89 y.o. female who presents to the clinic today for ***  Patient Profile   Wanda Martinez is followed by Dr. Cave for the history outlined below.      Past medical history significant for: Palpitations/PSVT. 7-day ZIO 07/20/2020: Predominantly sinus bradycardia with sinus arrhythmia.  HR 46 to 136 bpm, average 62 bpm.  29 runs of SVT longest 11 beats, fastest 136 bpm.  Occasional PACs. Echo 07/10/2023: EF 55 to 60%.  No RWMA.  Mild LVH.  Indeterminate diastolic parameters.  Normal RV size/function.  No significant valvular abnormalities.  Trivial circumferential pericardial effusion with no evidence of cardiac tamponade. Hypertension. Hyperlipidemia Lipid panel 02/18/2024: LDL 59, HDL 49, TG 91, total 127. GERD. Chronic pain. CKD stage III. Breast cancer S/p bilateral mastectomy. Parkinson's.  In summary, patient was previously followed by Dr. Bosie with [name] cardiology for palpitations.  She had a normal nuclear stress test in December 2020.  Patient wore a 7-day monitor in August 2021 which demonstrated runs of SVT.  Palpitations managed with Toprol  twice daily.  She was last seen in September 2023.    Patient established care with Dr. Cave on 05/02/2023.  She was doing well at that time and reported palpitations adequately controlled with Toprol .  She underwent echo which demonstrated normal LV/RV function.  Patient was last seen in the office by Dr. Cave on 07/16/2023 for follow-up after echo.  She reported worsening lower extremity edema progressing throughout the day.  It was felt edema likely coming from venous insufficiency and patient was instructed to utilize compression stockings and elevation.     History of Present Illness    Today, patient  ***  Palpitations/PSVT 7-day ZIO August 2021 demonstrated 29 runs of SVT longest 11 beats, fastest 136 bpm.  Patient*** - Continue Toprol .  Lower extremity edema Patient*** -***  Hypertension BP today*** - Continue hydrochlorothiazide , losartan , Toprol .  Hyperlipidemia LDL 59 March 2025, at goal. - Continue rosuvastatin .  ROS: All other systems reviewed and are otherwise negative except as noted in History of Present Illness.  EKGs/Labs Reviewed        04/24/2024: ALT 20; AST 22; BUN 43; Creatinine, Ser 0.96; Potassium 3.7; Sodium 132   05/30/2024: Hemoglobin 11.1; WBC 7.8   No results found for requested labs within last 365 days.   No results found for requested labs within last 365 days.  ***  Risk Assessment/Calculations    {Does this patient have ATRIAL FIBRILLATION?[phone]} No BP recorded.  {Refresh Note OR Click here to enter BP  :1}***        Physical Exam    VS:  LMP 12/18/1981  , BMI There is no height or weight on file to calculate BMI.  GEN: Well nourished, well developed, in no acute distress. Neck: No JVD or carotid bruits. Cardiac: *** RRR. *** No murmur. No rubs or gallops.   Respiratory:  Respirations regular and unlabored. Clear to auscultation without rales, wheezing or rhonchi. GI: Soft, nontender, nondistended. Extremities: Radials/DP/PT 2+ and equal bilaterally. No clubbing or cyanosis. No edema ***  Skin: Warm and dry, no rash. Neuro: Strength intact.  Assessment & Plan   ***  Disposition: ***     {Are you ordering a CV Procedure (e.g. stress test, cath, DCCV, TEE, etc)?   Press F2        :  [id number]}   [name] HERO. [name], DNP, NP-C

## 2024-10-09 ENCOUNTER — Ambulatory Visit: Attending: Student | Admitting: Student

## 2024-10-10 ENCOUNTER — Encounter: Payer: Self-pay | Admitting: Student

## 2024-11-06 ENCOUNTER — Encounter: Payer: Self-pay | Admitting: Oncology

## 2024-11-06 ENCOUNTER — Other Ambulatory Visit: Payer: Self-pay | Admitting: Internal Medicine

## 2024-12-10 ENCOUNTER — Other Ambulatory Visit: Payer: Self-pay | Admitting: Internal Medicine

## 2025-01-18 DEATH — deceased

## 2025-01-19 ENCOUNTER — Encounter: Payer: Self-pay | Admitting: Oncology

## 2025-01-20 ENCOUNTER — Telehealth: Payer: Self-pay | Admitting: *Deleted

## 2025-01-20 NOTE — Telephone Encounter (Signed)
 Called and spoke to Mr Ulloa.

## 2025-01-26 ENCOUNTER — Ambulatory Visit: Payer: Medicare Other
